# Patient Record
Sex: Male | Born: 1945
Health system: Southern US, Community
[De-identification: ages and names within clinical notes are randomized; demographics above are authoritative.]

## PROBLEM LIST (undated history)

## (undated) DIAGNOSIS — D649 Anemia, unspecified: Secondary | ICD-10-CM

## (undated) DIAGNOSIS — E781 Pure hyperglyceridemia: Secondary | ICD-10-CM

## (undated) DIAGNOSIS — Z860100 Personal history of colon polyps, unspecified: Secondary | ICD-10-CM

## (undated) DIAGNOSIS — M21372 Foot drop, left foot: Secondary | ICD-10-CM

## (undated) DIAGNOSIS — Z8719 Personal history of other diseases of the digestive system: Secondary | ICD-10-CM

## (undated) DIAGNOSIS — Z8489 Family history of other specified conditions: Secondary | ICD-10-CM

## (undated) DIAGNOSIS — K219 Gastro-esophageal reflux disease without esophagitis: Secondary | ICD-10-CM

## (undated) DIAGNOSIS — Z8673 Personal history of transient ischemic attack (TIA), and cerebral infarction without residual deficits: Secondary | ICD-10-CM

## (undated) DIAGNOSIS — I10 Essential (primary) hypertension: Secondary | ICD-10-CM

## (undated) DIAGNOSIS — I350 Nonrheumatic aortic (valve) stenosis: Secondary | ICD-10-CM

## (undated) DIAGNOSIS — Z8679 Personal history of other diseases of the circulatory system: Secondary | ICD-10-CM

## (undated) DIAGNOSIS — E1140 Type 2 diabetes mellitus with diabetic neuropathy, unspecified: Secondary | ICD-10-CM

## (undated) HISTORY — PX: SPINAL FUSION: SHX223

## (undated) HISTORY — DX: Foot drop, left foot: M21.372

## (undated) HISTORY — DX: Personal history of colon polyps, unspecified: Z86.0100

## (undated) HISTORY — PX: HAND SURGERY: SHX662

## (undated) HISTORY — DX: Anemia, unspecified: D64.9

## (undated) HISTORY — DX: Type 2 diabetes mellitus with diabetic neuropathy, unspecified: E11.40

## (undated) HISTORY — DX: Personal history of colonic polyps: Z86.010

## (undated) HISTORY — PX: COLONOSCOPY: SHX174

## (undated) HISTORY — PX: ESOPHAGOGASTRODUODENOSCOPY: SHX1529

## (undated) HISTORY — PX: TONSILLECTOMY: SUR1361

## (undated) HISTORY — DX: Personal history of transient ischemic attack (TIA), and cerebral infarction without residual deficits: Z86.73

## (undated) HISTORY — DX: Nonrheumatic aortic (valve) stenosis: I35.0

## (undated) HISTORY — DX: Personal history of other diseases of the circulatory system: Z86.79

## (undated) HISTORY — DX: Essential (primary) hypertension: I10

## (undated) HISTORY — DX: Pure hyperglyceridemia: E78.1

## (undated) SURGERY — CRANIOTOMY HEMATOMA EVACUATION SUBDURAL
Anesthesia: General | Laterality: Left

---

## 2001-10-07 ENCOUNTER — Encounter: Payer: Self-pay | Admitting: Emergency Medicine

## 2001-10-07 ENCOUNTER — Emergency Department (HOSPITAL_COMMUNITY): Admission: EM | Admit: 2001-10-07 | Discharge: 2001-10-07 | Payer: Self-pay | Admitting: Emergency Medicine

## 2002-09-28 ENCOUNTER — Encounter: Payer: Self-pay | Admitting: Orthopaedic Surgery

## 2002-10-03 ENCOUNTER — Encounter: Payer: Self-pay | Admitting: Orthopaedic Surgery

## 2002-10-03 ENCOUNTER — Observation Stay (HOSPITAL_COMMUNITY): Admission: RE | Admit: 2002-10-03 | Discharge: 2002-10-04 | Payer: Self-pay | Admitting: Orthopaedic Surgery

## 2004-03-31 ENCOUNTER — Ambulatory Visit (HOSPITAL_COMMUNITY): Admission: RE | Admit: 2004-03-31 | Discharge: 2004-03-31 | Payer: Self-pay | Admitting: Orthopedic Surgery

## 2004-03-31 ENCOUNTER — Ambulatory Visit (HOSPITAL_BASED_OUTPATIENT_CLINIC_OR_DEPARTMENT_OTHER): Admission: RE | Admit: 2004-03-31 | Discharge: 2004-03-31 | Payer: Self-pay | Admitting: Orthopedic Surgery

## 2004-12-21 IMAGING — CR DG CHEST 2V
2 series · 2 of 2 positions shown · non-contrast
Comparison: Report from prior study dated [DATE].

CLINICAL DATA: Lumbar spondylosis and spinal stenosis.  Preoperative respiratory exam. 
 TWO VIEW CHEST ? [DATE]:

[view not recorded (1 of 2)]
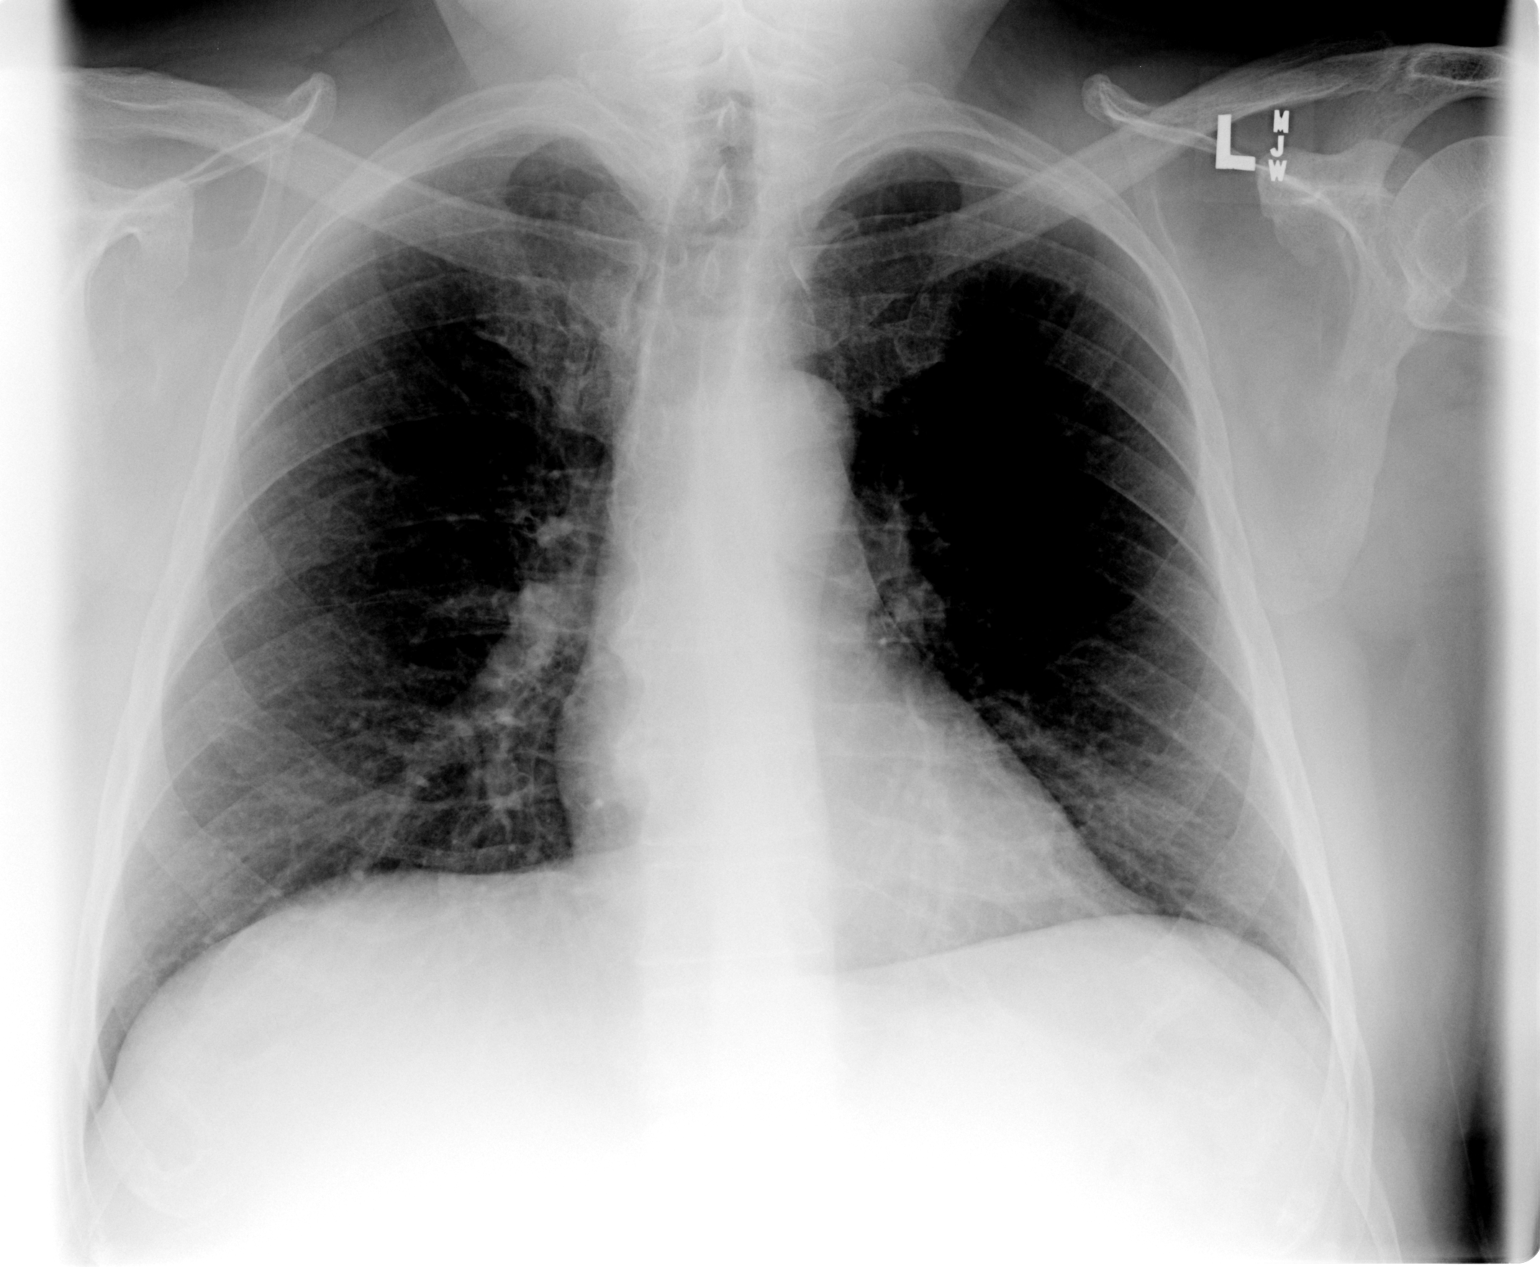

[view not recorded (2 of 2)]
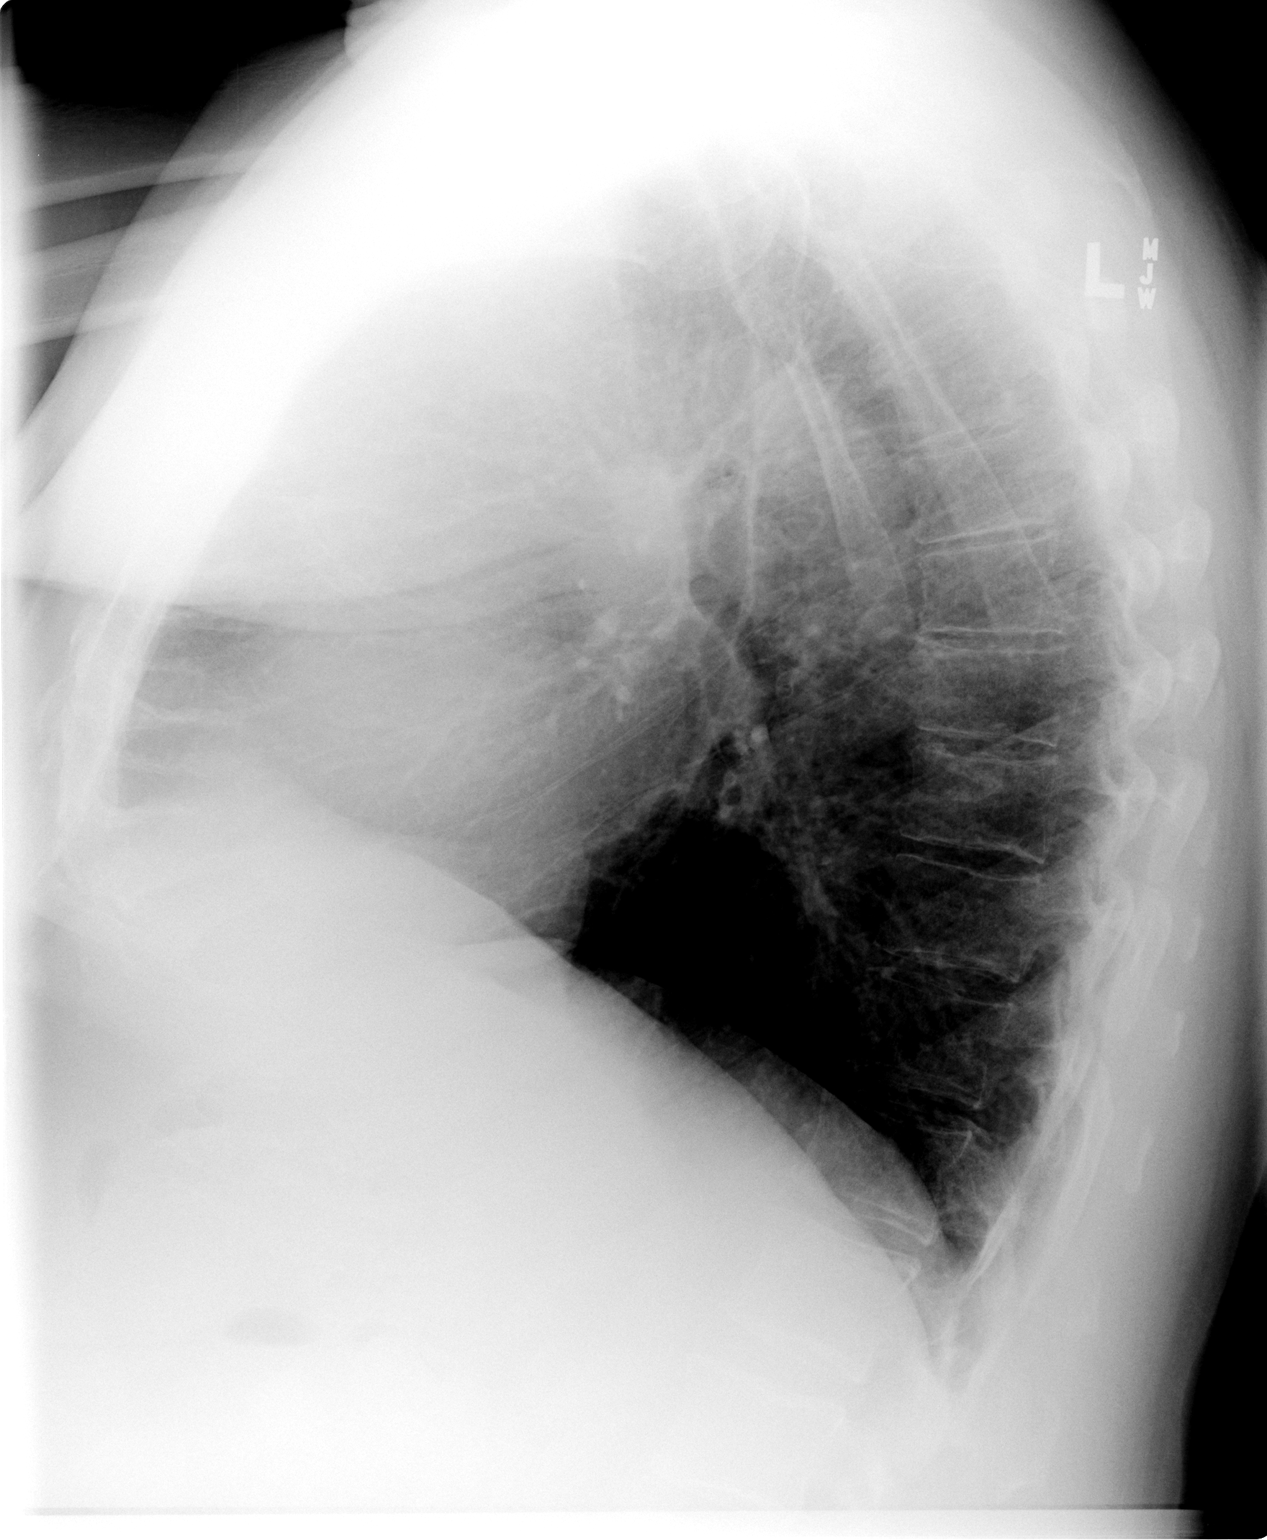

[2 of 2 positions shown; findings below may reference images not displayed]

The heart size and mediastinal contours are normal.  The lungs are clear.  The visualized skeleton is unremarkable.
IMPRESSION: No active disease.

## 2004-12-23 ENCOUNTER — Inpatient Hospital Stay (HOSPITAL_COMMUNITY): Admission: RE | Admit: 2004-12-23 | Discharge: 2004-12-26 | Payer: Self-pay | Admitting: Orthopaedic Surgery

## 2005-09-22 ENCOUNTER — Ambulatory Visit: Payer: Self-pay | Admitting: Gastroenterology

## 2005-10-04 ENCOUNTER — Ambulatory Visit: Payer: Self-pay | Admitting: Gastroenterology

## 2005-12-30 ENCOUNTER — Ambulatory Visit: Payer: Self-pay | Admitting: Oncology

## 2006-01-27 LAB — CHCC SMEAR

## 2006-01-27 LAB — CBC & DIFF AND RETIC
BASO%: 1.3 % (ref 0.0–2.0)
Basophils Absolute: 0.1 10*3/uL (ref 0.0–0.1)
EOS%: 3.4 % (ref 0.0–7.0)
HCT: 32.6 % — ABNORMAL LOW (ref 38.7–49.9)
HGB: 11.5 g/dL — ABNORMAL LOW (ref 13.0–17.1)
MCHC: 35.3 g/dL (ref 32.0–35.9)
MONO#: 0.5 10*3/uL (ref 0.1–0.9)
MONO%: 5.5 % (ref 0.0–13.0)
NEUT%: 67.6 % (ref 40.0–75.0)
RDW: 14.3 % (ref 11.2–14.6)
WBC: 9.4 10*3/uL (ref 4.0–10.0)

## 2006-01-27 LAB — ERYTHROCYTE SEDIMENTATION RATE: Sed Rate: 32 mm/hr — ABNORMAL HIGH (ref 0–20)

## 2006-01-28 LAB — FERRITIN: Ferritin: 277 ng/mL (ref 22–322)

## 2006-01-28 LAB — COMPREHENSIVE METABOLIC PANEL
AST: 19 U/L (ref 0–37)
Albumin: 5 g/dL (ref 3.5–5.2)
CO2: 27 mEq/L (ref 19–32)
Glucose, Bld: 86 mg/dL (ref 70–99)
Sodium: 139 mEq/L (ref 135–145)
Total Protein: 7.6 g/dL (ref 6.0–8.3)

## 2006-01-28 LAB — IRON AND TIBC
Iron: 89 ug/dL (ref 42–165)
TIBC: 426 ug/dL (ref 215–435)
UIBC: 337 ug/dL

## 2006-02-15 ENCOUNTER — Ambulatory Visit: Payer: Self-pay | Admitting: Oncology

## 2006-02-18 ENCOUNTER — Ambulatory Visit (HOSPITAL_COMMUNITY): Admission: RE | Admit: 2006-02-18 | Discharge: 2006-02-18 | Payer: Self-pay | Admitting: Oncology

## 2006-02-18 LAB — RETICULOCYTES
IRF: 0.44 — ABNORMAL HIGH (ref 0.070–0.380)
RETIC #: 91.3 10*3/uL (ref 31.8–103.9)

## 2006-02-18 LAB — CBC WITH DIFFERENTIAL/PLATELET
BASO%: 0.5 % (ref 0.0–2.0)
EOS%: 5.7 % (ref 0.0–7.0)
HGB: 11.8 g/dL — ABNORMAL LOW (ref 13.0–17.1)
MCV: 85.8 fL (ref 81.6–98.0)
MONO%: 5.6 % (ref 0.0–13.0)
NEUT#: 5.5 10*3/uL (ref 1.5–6.5)
RDW: 14.4 % (ref 11.2–14.6)
WBC: 9.5 10*3/uL (ref 4.0–10.0)

## 2006-02-18 LAB — CHCC SMEAR

## 2006-02-18 IMAGING — CR DG CHEST 2V
2 series · 2 of 2 positions shown · non-contrast
Comparison: [DATE].

CLINICAL DATA: Anemia and shortness of breath.
 CHEST - 2 VIEW ? [DATE]:

[view not recorded (1 of 2)]
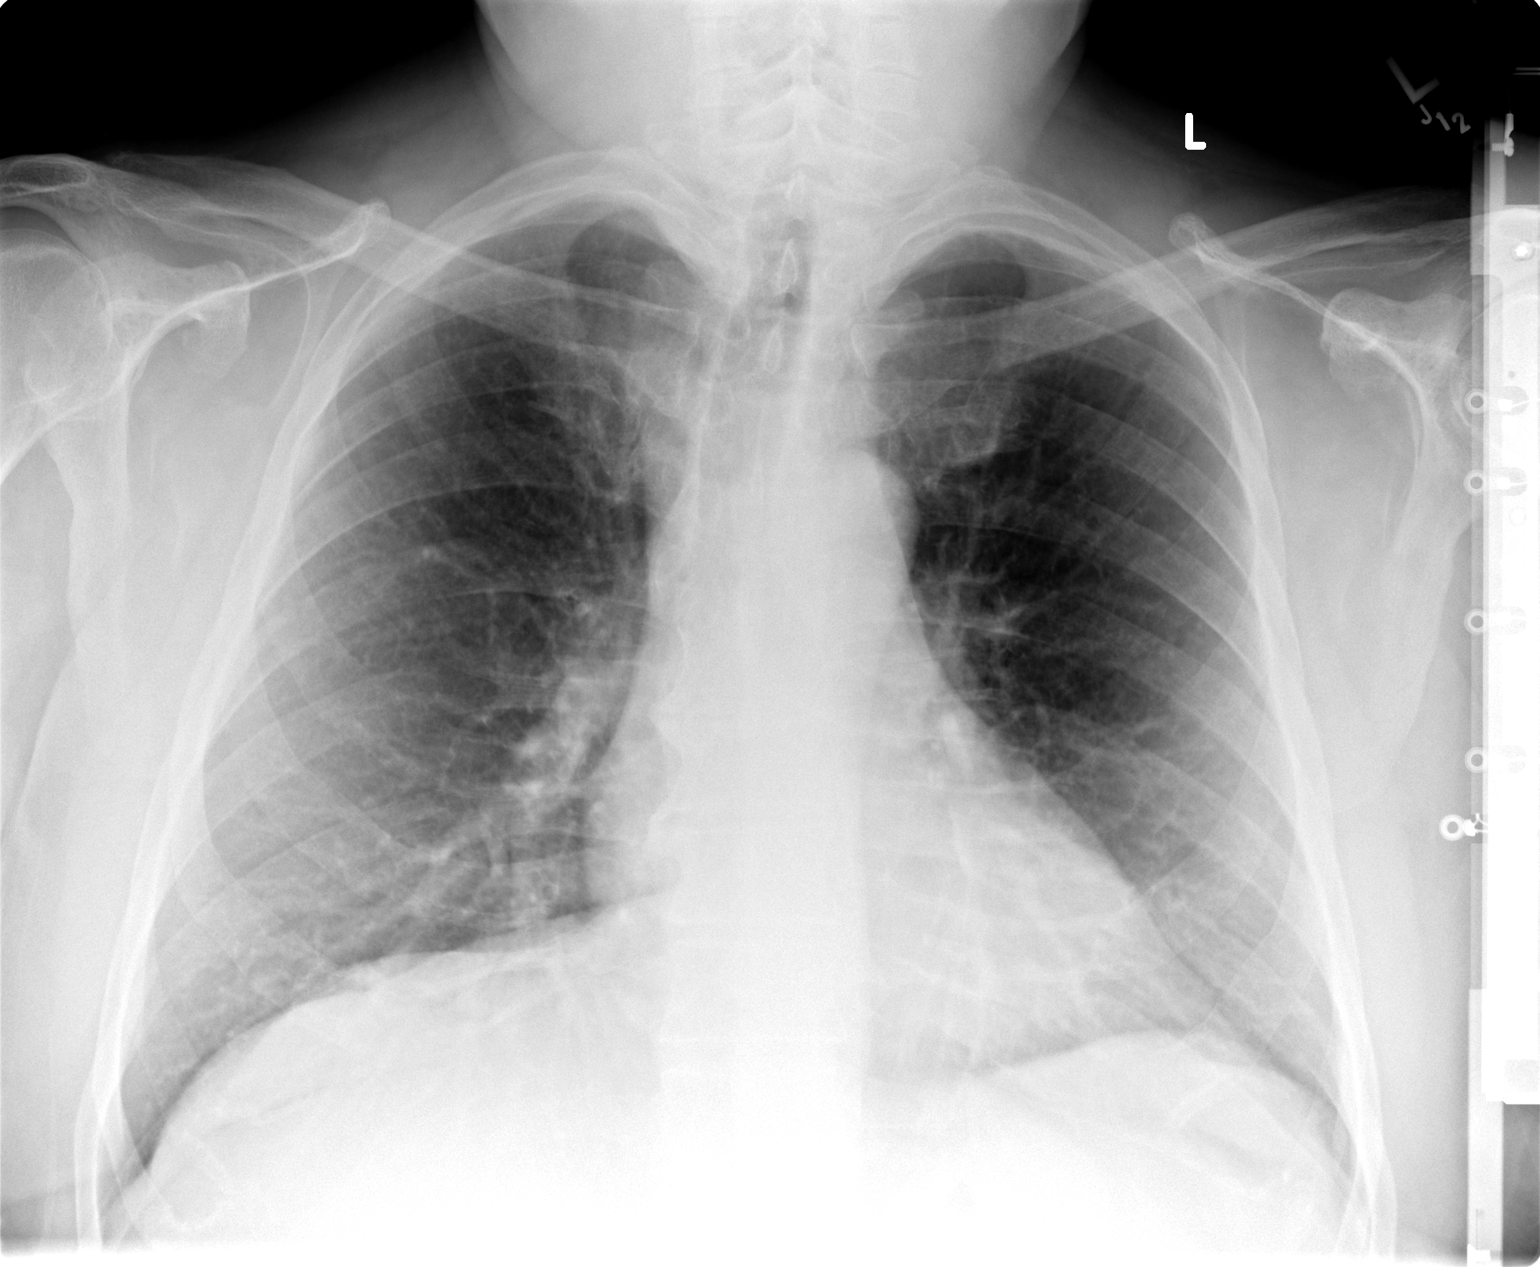

[view not recorded (2 of 2)]
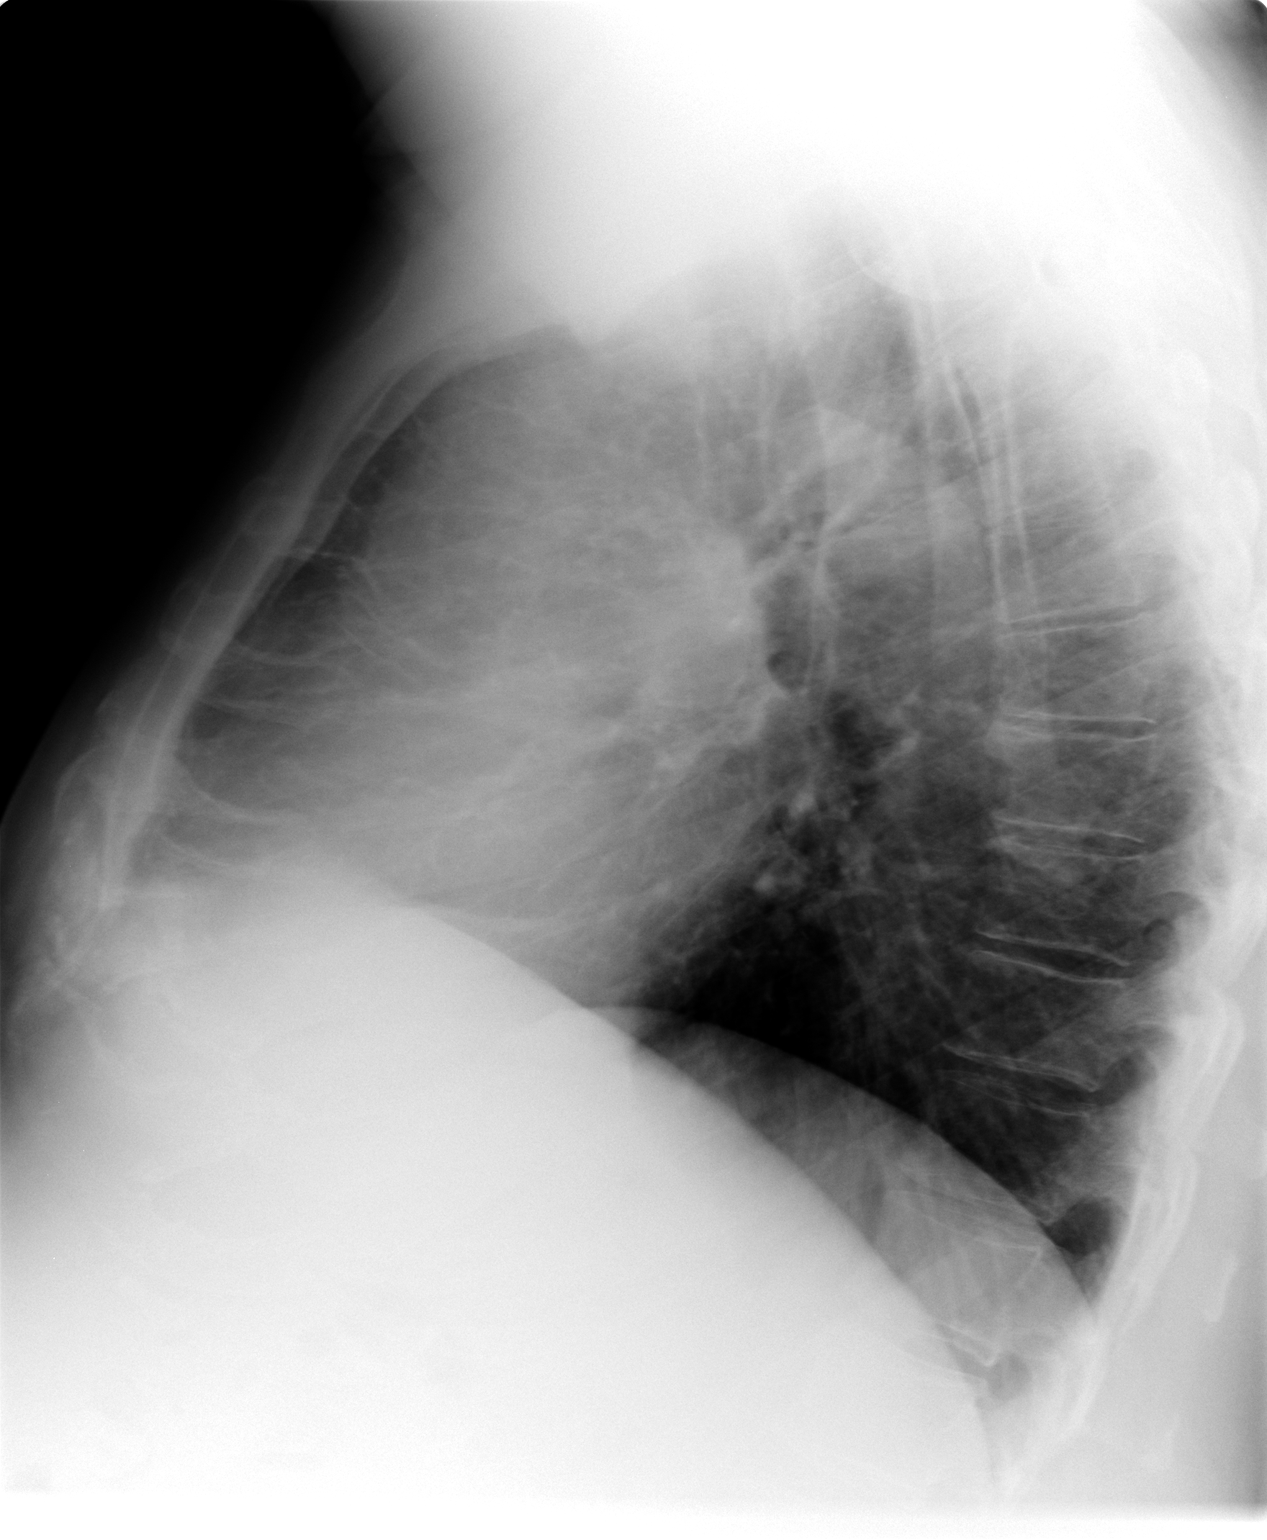

[2 of 2 positions shown; findings below may reference images not displayed]

FINDINGS: Mild bibasilar atelectasis is present.  No edema, infiltrate, nodule, or mass is detected.  The heart size and mediastinal contours are within normal limits.  The visualized bony thorax is unremarkable.
IMPRESSION: Bibasilar atelectasis.  No active disease.

## 2006-02-22 ENCOUNTER — Ambulatory Visit (HOSPITAL_COMMUNITY): Admission: RE | Admit: 2006-02-22 | Discharge: 2006-02-22 | Payer: Self-pay | Admitting: Oncology

## 2006-02-22 IMAGING — US US ABDOMEN COMPLETE
1 series · 13 of 25 positions shown · non-contrast
Comparison: none

CLINICAL DATA: Abdominal pain, anemia, iron deficiency.
 ABDOMEN ULTRASOUND COMPLETE ? [DATE]:
TECHNIQUE: Complete abdominal ultrasound examination was performed including evaluation of the liver, gallbladder, bile ducts, pancreas, kidneys, spleen, IVC, and abdominal aorta.

[Series 1: unknown · 0.43mm/px · 13 of 80 slices shown]
[im 1/80]
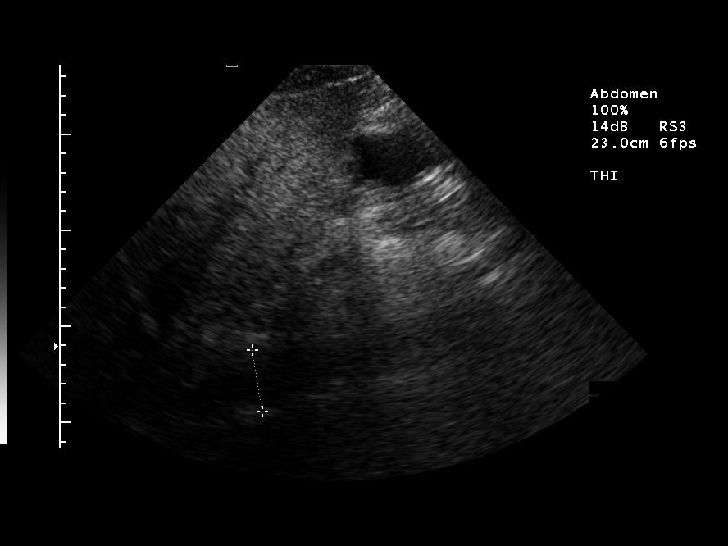
[im 7/80]
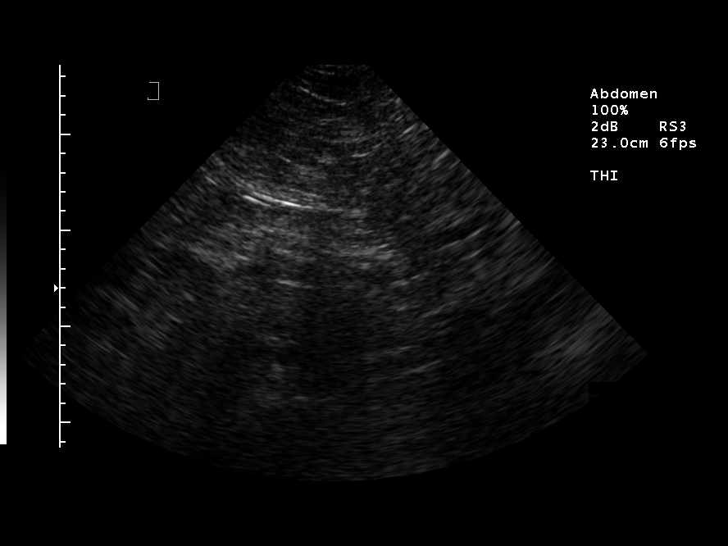
[im 14/80]
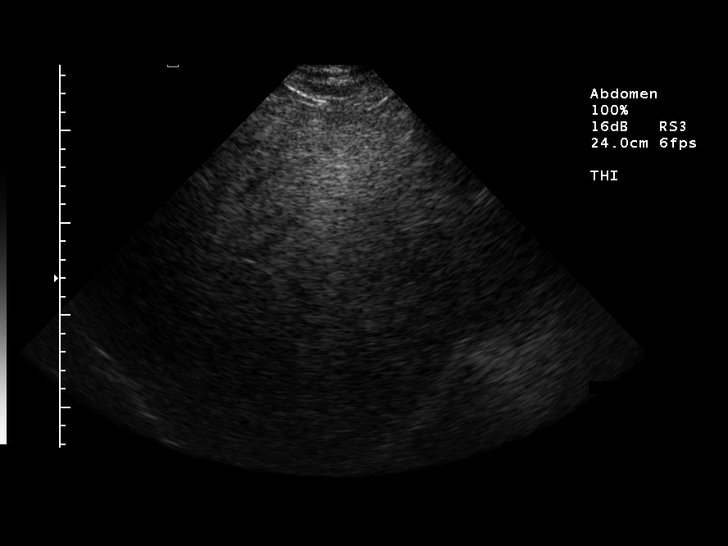
[im 20/80]
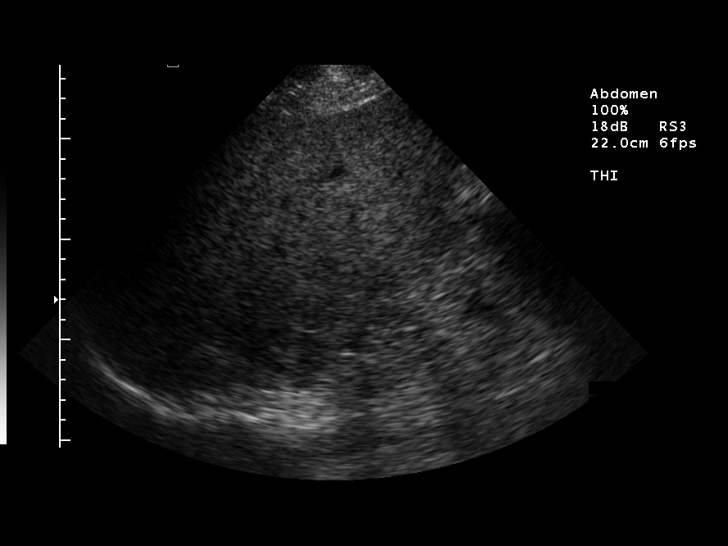
[im 27/80]
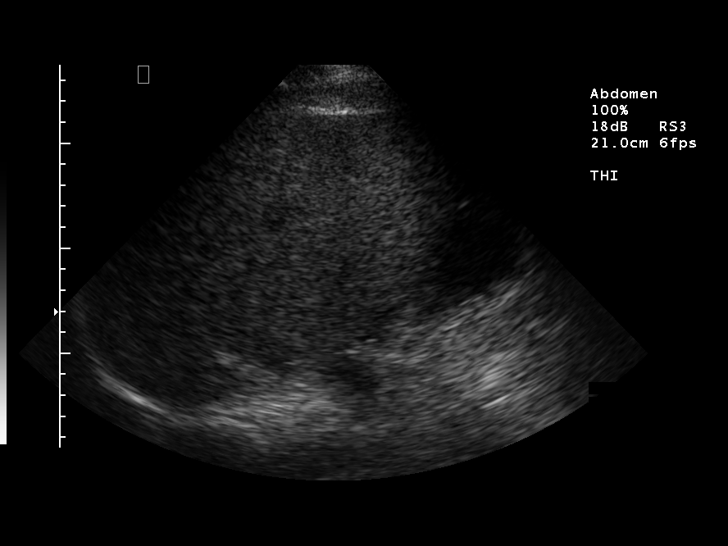
[im 33/80]
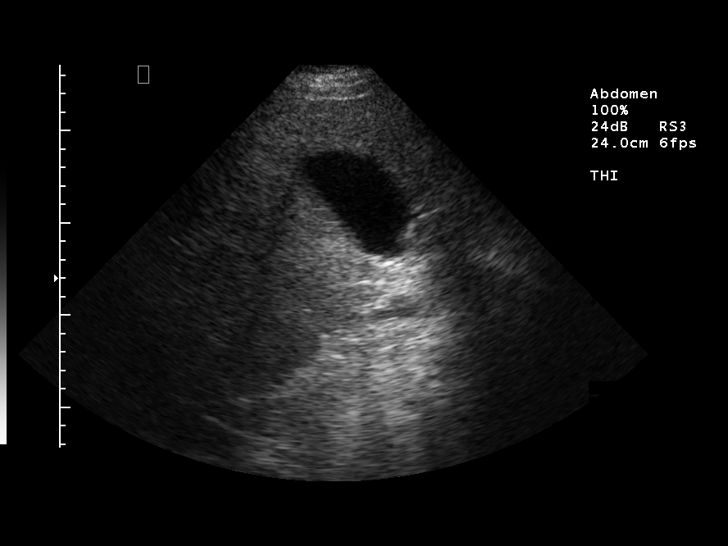
[im 40/80]
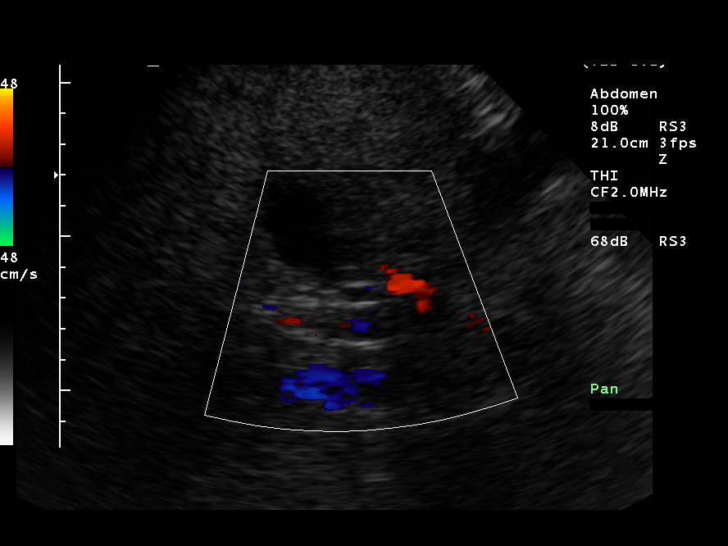
[im 47/80]
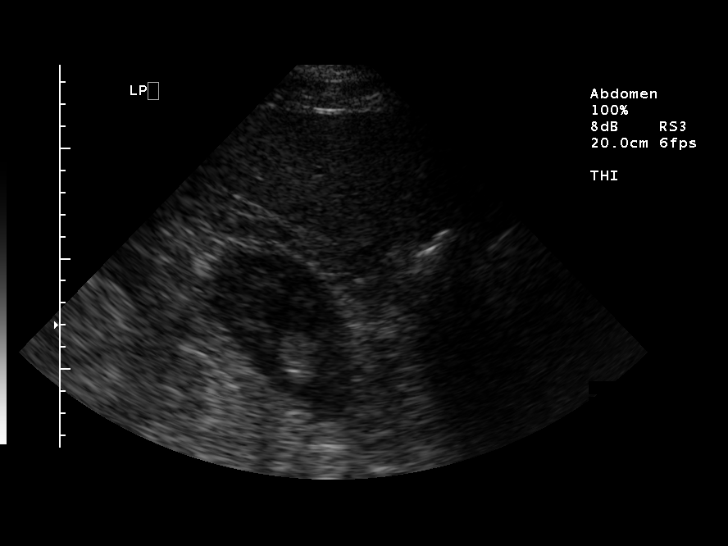
[im 53/80]
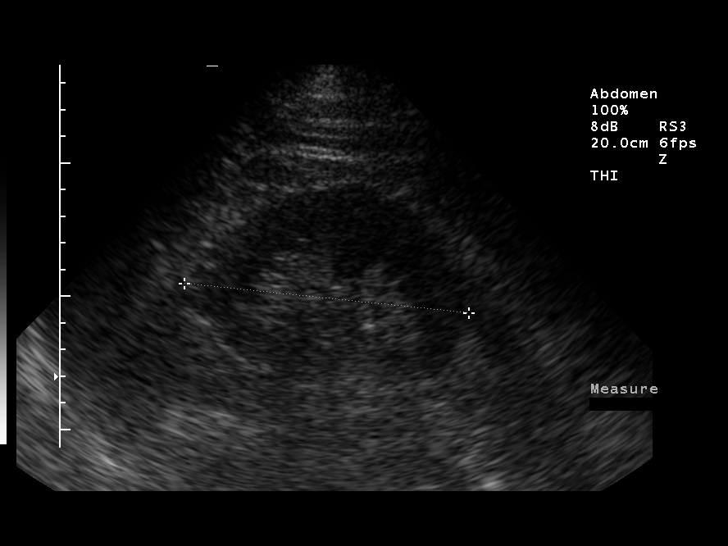
[im 60/80]
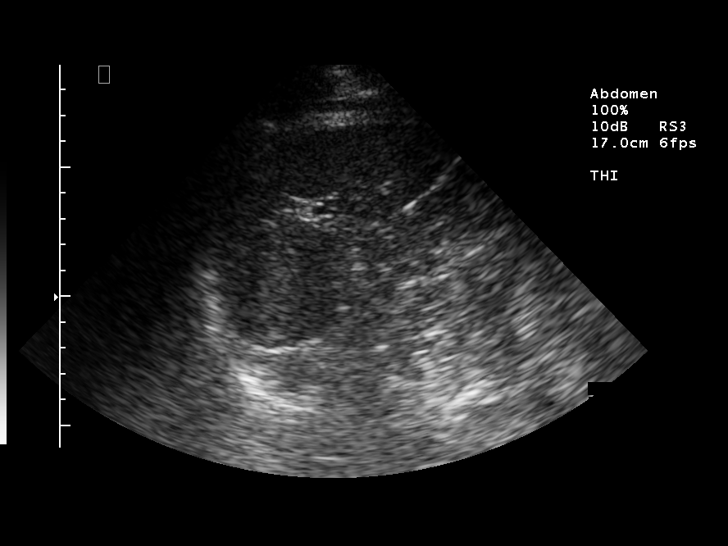
[im 66/80]
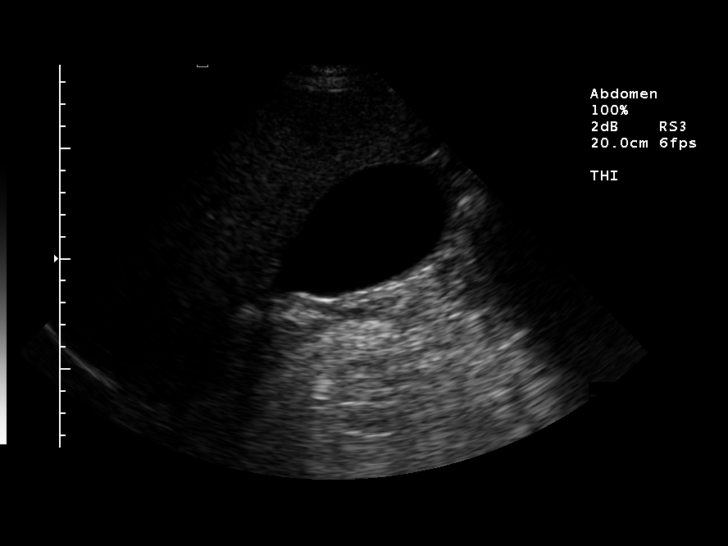
[im 73/80]
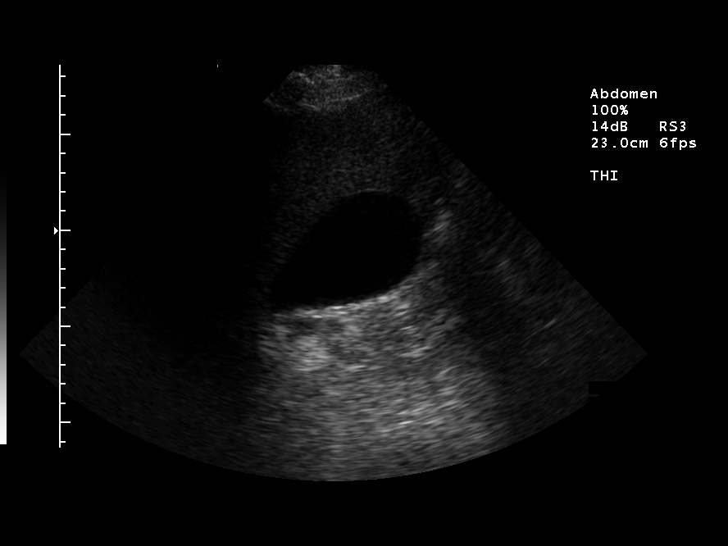
[im 80/80]
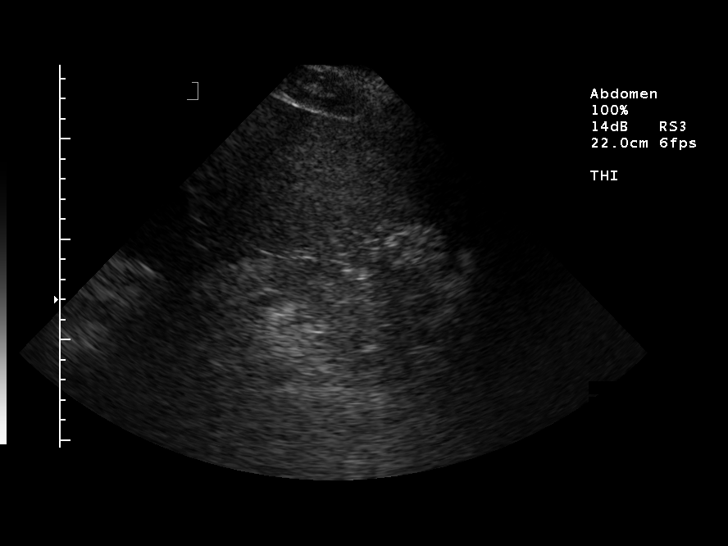

[13 of 25 positions shown; findings below may reference images not displayed]

FINDINGS: The gallbladder is adequately visualized and has a normal appearance.  The common bile duct is normal in caliber measuring 5 mm in diameter.  The liver is somewhat prominent in size and is somewhat echodense consistent with fatty change.  There are no focal hepatic abnormalities.  Portions of the dome of the right lobe of the liver cannot be visualized.  The inferior vena cava is patent.  The pancreas is not well visualized due to overlying bowel gas.  The spleen and kidneys have a normal appearance with the right kidney measuring 11.7 cm in length and the left kidney measuring 10.7 cm in length.  The abdominal aorta is poorly visualized within the mid and distal portions due to overlying bowel gas.
IMPRESSION: 1.  Normal appearing gallbladder and common bile duct.  
 2.  Incomplete visualization of the pancreas and mid and distal abdominal aorta due to overlying bowel gas.

## 2006-04-12 ENCOUNTER — Ambulatory Visit: Payer: Self-pay | Admitting: Oncology

## 2006-04-15 LAB — CBC WITH DIFFERENTIAL/PLATELET
LYMPH%: 26.7 % (ref 14.0–48.0)
MCH: 29.7 pg (ref 28.0–33.4)
MCV: 86.9 fL (ref 81.6–98.0)
MONO#: 0.5 10*3/uL (ref 0.1–0.9)
MONO%: 7 % (ref 0.0–13.0)
NEUT#: 4.8 10*3/uL (ref 1.5–6.5)
Platelets: 422 10*3/uL — ABNORMAL HIGH (ref 145–400)
RBC: 3.91 10*6/uL — ABNORMAL LOW (ref 4.20–5.71)
RDW: 14.6 % (ref 11.2–14.6)

## 2006-04-15 LAB — CHCC SMEAR

## 2006-04-15 LAB — LACTATE DEHYDROGENASE: LDH: 106 U/L (ref 94–250)

## 2006-04-15 LAB — COMPREHENSIVE METABOLIC PANEL
ALT: 14 U/L (ref 0–40)
Chloride: 103 mEq/L (ref 96–112)
Glucose, Bld: 161 mg/dL — ABNORMAL HIGH (ref 70–99)
Sodium: 140 mEq/L (ref 135–145)
Total Bilirubin: 0.3 mg/dL (ref 0.3–1.2)

## 2006-04-15 LAB — ERYTHROCYTE SEDIMENTATION RATE: Sed Rate: 23 mm/hr — ABNORMAL HIGH (ref 0–20)

## 2008-07-25 HISTORY — PX: OTHER SURGICAL HISTORY: SHX169

## 2008-07-25 HISTORY — PX: TARSAL TUNNEL RELEASE: SUR1099

## 2008-07-25 HISTORY — PX: FOOT CAPSULOTOMY: SUR191

## 2012-11-16 DIAGNOSIS — E1165 Type 2 diabetes mellitus with hyperglycemia: Secondary | ICD-10-CM | POA: Insufficient documentation

## 2013-02-06 ENCOUNTER — Encounter: Payer: Self-pay | Admitting: Podiatry

## 2013-02-06 ENCOUNTER — Ambulatory Visit (INDEPENDENT_AMBULATORY_CARE_PROVIDER_SITE_OTHER): Payer: BLUE CROSS/BLUE SHIELD | Admitting: Podiatry

## 2013-02-06 VITALS — Ht 71.0 in | Wt 278.0 lb

## 2013-02-06 DIAGNOSIS — S6982XA Other specified injuries of left wrist, hand and finger(s), initial encounter: Secondary | ICD-10-CM

## 2013-02-06 DIAGNOSIS — M25579 Pain in unspecified ankle and joints of unspecified foot: Secondary | ICD-10-CM | POA: Insufficient documentation

## 2013-02-06 DIAGNOSIS — E1149 Type 2 diabetes mellitus with other diabetic neurological complication: Secondary | ICD-10-CM

## 2013-02-06 DIAGNOSIS — M25572 Pain in left ankle and joints of left foot: Secondary | ICD-10-CM

## 2013-02-06 DIAGNOSIS — S6980XA Other specified injuries of unspecified wrist, hand and finger(s), initial encounter: Secondary | ICD-10-CM

## 2013-02-06 DIAGNOSIS — S6990XA Unspecified injury of unspecified wrist, hand and finger(s), initial encounter: Secondary | ICD-10-CM

## 2013-02-06 NOTE — Progress Notes (Signed)
Subjective:  Type II IDDM presents complaining of damaged nail 2nd left without history of injury.  He noted the toe was bleeding since 4/20 (Sunday), and now the whole nail came off and sore .  He came in to have it checked out.  Blood sugar is 122, A1C was 7.9 when checked last 2 month ago.  Podiatric History:  Tommy Green was seen at this office in 2009 for Tarsal tunnel release and hammer toe problem 2nd left. Tarsal tunnel release and Hammer toe surgery was done on the left foot. Stated that Tarsal tunnel release helped him for about a year and the problem came back. The 2nd toe left is straight and is in extended position at MPJ level.   Objective:  DP and PT palpable left. Broken off nail at distal 1/4, with clean nail bed. S/P Tarsal tunnel release in Nov 2009,   And Hammer toe surgery 2nd left.   Assessment:  Clean nail injury with exposed nail bed at distal 1/4, 2nd digit left.  Plan:  Keep it clean Betadine and covered with band aid till tenderness subside.

## 2013-07-31 DIAGNOSIS — E1169 Type 2 diabetes mellitus with other specified complication: Secondary | ICD-10-CM | POA: Insufficient documentation

## 2013-11-02 DIAGNOSIS — I00 Rheumatic fever without heart involvement: Secondary | ICD-10-CM | POA: Insufficient documentation

## 2013-11-02 DIAGNOSIS — K219 Gastro-esophageal reflux disease without esophagitis: Secondary | ICD-10-CM | POA: Insufficient documentation

## 2014-01-31 DIAGNOSIS — E786 Lipoprotein deficiency: Secondary | ICD-10-CM | POA: Insufficient documentation

## 2014-02-18 ENCOUNTER — Telehealth: Payer: Self-pay | Admitting: *Deleted

## 2014-02-18 NOTE — Telephone Encounter (Signed)
A user error has taken place.

## 2014-08-19 ENCOUNTER — Telehealth: Payer: Self-pay | Admitting: Gastroenterology

## 2014-08-20 NOTE — Telephone Encounter (Signed)
Recall is for 09/2015.  He is notified.  He is advised that he will get a letter next year

## 2015-01-13 DIAGNOSIS — J320 Chronic maxillary sinusitis: Secondary | ICD-10-CM | POA: Insufficient documentation

## 2015-01-27 DIAGNOSIS — H6993 Unspecified Eustachian tube disorder, bilateral: Secondary | ICD-10-CM | POA: Insufficient documentation

## 2015-05-15 DIAGNOSIS — Z87891 Personal history of nicotine dependence: Secondary | ICD-10-CM | POA: Insufficient documentation

## 2015-09-25 DIAGNOSIS — L02416 Cutaneous abscess of left lower limb: Secondary | ICD-10-CM | POA: Insufficient documentation

## 2015-10-10 ENCOUNTER — Encounter: Payer: Self-pay | Admitting: Gastroenterology

## 2015-11-04 DIAGNOSIS — M24152 Other articular cartilage disorders, left hip: Secondary | ICD-10-CM | POA: Insufficient documentation

## 2015-12-10 DIAGNOSIS — J349 Unspecified disorder of nose and nasal sinuses: Secondary | ICD-10-CM | POA: Insufficient documentation

## 2015-12-15 DIAGNOSIS — R0981 Nasal congestion: Secondary | ICD-10-CM | POA: Insufficient documentation

## 2015-12-20 DIAGNOSIS — J343 Hypertrophy of nasal turbinates: Secondary | ICD-10-CM | POA: Insufficient documentation

## 2016-03-19 DIAGNOSIS — E1129 Type 2 diabetes mellitus with other diabetic kidney complication: Secondary | ICD-10-CM | POA: Insufficient documentation

## 2016-06-19 DIAGNOSIS — D649 Anemia, unspecified: Secondary | ICD-10-CM | POA: Insufficient documentation

## 2016-06-30 DIAGNOSIS — Z1159 Encounter for screening for other viral diseases: Secondary | ICD-10-CM | POA: Insufficient documentation

## 2016-10-29 DIAGNOSIS — I10 Essential (primary) hypertension: Secondary | ICD-10-CM | POA: Diagnosis not present

## 2016-10-29 DIAGNOSIS — R809 Proteinuria, unspecified: Secondary | ICD-10-CM | POA: Diagnosis not present

## 2016-10-29 DIAGNOSIS — E1129 Type 2 diabetes mellitus with other diabetic kidney complication: Secondary | ICD-10-CM | POA: Diagnosis not present

## 2016-10-29 DIAGNOSIS — E1142 Type 2 diabetes mellitus with diabetic polyneuropathy: Secondary | ICD-10-CM | POA: Diagnosis not present

## 2016-10-29 DIAGNOSIS — E1165 Type 2 diabetes mellitus with hyperglycemia: Secondary | ICD-10-CM | POA: Diagnosis not present

## 2016-10-29 DIAGNOSIS — E781 Pure hyperglyceridemia: Secondary | ICD-10-CM | POA: Diagnosis not present

## 2016-11-15 DIAGNOSIS — R69 Illness, unspecified: Secondary | ICD-10-CM | POA: Diagnosis not present

## 2016-11-24 DIAGNOSIS — Z0100 Encounter for examination of eyes and vision without abnormal findings: Secondary | ICD-10-CM | POA: Diagnosis not present

## 2016-11-25 DIAGNOSIS — E669 Obesity, unspecified: Secondary | ICD-10-CM | POA: Diagnosis not present

## 2016-11-25 DIAGNOSIS — M130 Polyarthritis, unspecified: Secondary | ICD-10-CM | POA: Diagnosis not present

## 2016-11-25 DIAGNOSIS — R42 Dizziness and giddiness: Secondary | ICD-10-CM | POA: Diagnosis not present

## 2016-11-25 DIAGNOSIS — R000 Tachycardia, unspecified: Secondary | ICD-10-CM | POA: Diagnosis not present

## 2016-11-25 DIAGNOSIS — E1142 Type 2 diabetes mellitus with diabetic polyneuropathy: Secondary | ICD-10-CM | POA: Diagnosis not present

## 2016-11-25 DIAGNOSIS — R011 Cardiac murmur, unspecified: Secondary | ICD-10-CM | POA: Diagnosis not present

## 2016-11-25 DIAGNOSIS — Z6837 Body mass index (BMI) 37.0-37.9, adult: Secondary | ICD-10-CM | POA: Diagnosis not present

## 2016-11-25 DIAGNOSIS — E785 Hyperlipidemia, unspecified: Secondary | ICD-10-CM | POA: Diagnosis not present

## 2016-11-25 DIAGNOSIS — Z87891 Personal history of nicotine dependence: Secondary | ICD-10-CM | POA: Diagnosis not present

## 2016-11-25 DIAGNOSIS — Z0000 Encounter for general adult medical examination without abnormal findings: Secondary | ICD-10-CM | POA: Diagnosis not present

## 2016-11-25 DIAGNOSIS — I10 Essential (primary) hypertension: Secondary | ICD-10-CM | POA: Diagnosis not present

## 2016-12-07 DIAGNOSIS — D649 Anemia, unspecified: Secondary | ICD-10-CM | POA: Diagnosis not present

## 2016-12-07 DIAGNOSIS — H6121 Impacted cerumen, right ear: Secondary | ICD-10-CM | POA: Diagnosis not present

## 2016-12-07 DIAGNOSIS — E1142 Type 2 diabetes mellitus with diabetic polyneuropathy: Secondary | ICD-10-CM | POA: Diagnosis not present

## 2016-12-07 DIAGNOSIS — I10 Essential (primary) hypertension: Secondary | ICD-10-CM | POA: Diagnosis not present

## 2016-12-07 DIAGNOSIS — G629 Polyneuropathy, unspecified: Secondary | ICD-10-CM | POA: Diagnosis not present

## 2017-01-04 DIAGNOSIS — R69 Illness, unspecified: Secondary | ICD-10-CM | POA: Diagnosis not present

## 2017-01-11 DIAGNOSIS — D485 Neoplasm of uncertain behavior of skin: Secondary | ICD-10-CM | POA: Diagnosis not present

## 2017-01-11 DIAGNOSIS — D044 Carcinoma in situ of skin of scalp and neck: Secondary | ICD-10-CM | POA: Diagnosis not present

## 2017-01-11 DIAGNOSIS — L821 Other seborrheic keratosis: Secondary | ICD-10-CM | POA: Diagnosis not present

## 2017-01-11 DIAGNOSIS — L820 Inflamed seborrheic keratosis: Secondary | ICD-10-CM | POA: Diagnosis not present

## 2017-01-11 DIAGNOSIS — L570 Actinic keratosis: Secondary | ICD-10-CM | POA: Diagnosis not present

## 2017-02-10 DIAGNOSIS — C44329 Squamous cell carcinoma of skin of other parts of face: Secondary | ICD-10-CM | POA: Diagnosis not present

## 2017-02-10 DIAGNOSIS — C4442 Squamous cell carcinoma of skin of scalp and neck: Secondary | ICD-10-CM | POA: Diagnosis not present

## 2017-02-23 DIAGNOSIS — E559 Vitamin D deficiency, unspecified: Secondary | ICD-10-CM | POA: Diagnosis not present

## 2017-02-23 DIAGNOSIS — E1165 Type 2 diabetes mellitus with hyperglycemia: Secondary | ICD-10-CM | POA: Diagnosis not present

## 2017-02-23 DIAGNOSIS — E781 Pure hyperglyceridemia: Secondary | ICD-10-CM | POA: Diagnosis not present

## 2017-02-25 DIAGNOSIS — G4733 Obstructive sleep apnea (adult) (pediatric): Secondary | ICD-10-CM | POA: Diagnosis not present

## 2017-02-25 DIAGNOSIS — E1129 Type 2 diabetes mellitus with other diabetic kidney complication: Secondary | ICD-10-CM | POA: Diagnosis not present

## 2017-02-25 DIAGNOSIS — E785 Hyperlipidemia, unspecified: Secondary | ICD-10-CM | POA: Diagnosis not present

## 2017-02-25 DIAGNOSIS — E1169 Type 2 diabetes mellitus with other specified complication: Secondary | ICD-10-CM | POA: Diagnosis not present

## 2017-02-25 DIAGNOSIS — E1142 Type 2 diabetes mellitus with diabetic polyneuropathy: Secondary | ICD-10-CM | POA: Diagnosis not present

## 2017-02-25 DIAGNOSIS — E119 Type 2 diabetes mellitus without complications: Secondary | ICD-10-CM | POA: Diagnosis not present

## 2017-02-25 DIAGNOSIS — R809 Proteinuria, unspecified: Secondary | ICD-10-CM | POA: Diagnosis not present

## 2017-02-25 DIAGNOSIS — I10 Essential (primary) hypertension: Secondary | ICD-10-CM | POA: Diagnosis not present

## 2017-03-01 DIAGNOSIS — R69 Illness, unspecified: Secondary | ICD-10-CM | POA: Diagnosis not present

## 2017-03-02 DIAGNOSIS — R69 Illness, unspecified: Secondary | ICD-10-CM | POA: Diagnosis not present

## 2017-03-24 DIAGNOSIS — M109 Gout, unspecified: Secondary | ICD-10-CM | POA: Diagnosis not present

## 2017-04-25 DIAGNOSIS — R69 Illness, unspecified: Secondary | ICD-10-CM | POA: Diagnosis not present

## 2017-05-02 DIAGNOSIS — R69 Illness, unspecified: Secondary | ICD-10-CM | POA: Diagnosis not present

## 2017-05-13 DIAGNOSIS — Z08 Encounter for follow-up examination after completed treatment for malignant neoplasm: Secondary | ICD-10-CM | POA: Diagnosis not present

## 2017-05-13 DIAGNOSIS — L570 Actinic keratosis: Secondary | ICD-10-CM | POA: Diagnosis not present

## 2017-05-13 DIAGNOSIS — L089 Local infection of the skin and subcutaneous tissue, unspecified: Secondary | ICD-10-CM | POA: Diagnosis not present

## 2017-05-13 DIAGNOSIS — Z85828 Personal history of other malignant neoplasm of skin: Secondary | ICD-10-CM | POA: Diagnosis not present

## 2017-06-03 DIAGNOSIS — E781 Pure hyperglyceridemia: Secondary | ICD-10-CM | POA: Diagnosis not present

## 2017-06-03 DIAGNOSIS — L089 Local infection of the skin and subcutaneous tissue, unspecified: Secondary | ICD-10-CM | POA: Diagnosis not present

## 2017-06-03 DIAGNOSIS — G629 Polyneuropathy, unspecified: Secondary | ICD-10-CM | POA: Diagnosis not present

## 2017-06-03 DIAGNOSIS — K219 Gastro-esophageal reflux disease without esophagitis: Secondary | ICD-10-CM | POA: Diagnosis not present

## 2017-06-03 DIAGNOSIS — D649 Anemia, unspecified: Secondary | ICD-10-CM | POA: Diagnosis not present

## 2017-06-03 DIAGNOSIS — I10 Essential (primary) hypertension: Secondary | ICD-10-CM | POA: Diagnosis not present

## 2017-06-03 DIAGNOSIS — E1142 Type 2 diabetes mellitus with diabetic polyneuropathy: Secondary | ICD-10-CM | POA: Diagnosis not present

## 2017-06-03 DIAGNOSIS — L309 Dermatitis, unspecified: Secondary | ICD-10-CM | POA: Diagnosis not present

## 2017-06-03 DIAGNOSIS — M1612 Unilateral primary osteoarthritis, left hip: Secondary | ICD-10-CM | POA: Diagnosis not present

## 2017-06-10 DIAGNOSIS — L03116 Cellulitis of left lower limb: Secondary | ICD-10-CM | POA: Diagnosis not present

## 2017-06-10 DIAGNOSIS — L02416 Cutaneous abscess of left lower limb: Secondary | ICD-10-CM | POA: Diagnosis not present

## 2017-06-15 DIAGNOSIS — M5136 Other intervertebral disc degeneration, lumbar region: Secondary | ICD-10-CM | POA: Insufficient documentation

## 2017-06-15 DIAGNOSIS — D509 Iron deficiency anemia, unspecified: Secondary | ICD-10-CM | POA: Insufficient documentation

## 2017-06-15 DIAGNOSIS — I639 Cerebral infarction, unspecified: Secondary | ICD-10-CM | POA: Insufficient documentation

## 2017-06-28 DIAGNOSIS — R809 Proteinuria, unspecified: Secondary | ICD-10-CM | POA: Diagnosis not present

## 2017-06-28 DIAGNOSIS — E1142 Type 2 diabetes mellitus with diabetic polyneuropathy: Secondary | ICD-10-CM | POA: Diagnosis not present

## 2017-06-28 DIAGNOSIS — D649 Anemia, unspecified: Secondary | ICD-10-CM | POA: Diagnosis not present

## 2017-06-28 DIAGNOSIS — E1129 Type 2 diabetes mellitus with other diabetic kidney complication: Secondary | ICD-10-CM | POA: Diagnosis not present

## 2017-06-28 DIAGNOSIS — I10 Essential (primary) hypertension: Secondary | ICD-10-CM | POA: Diagnosis not present

## 2017-06-28 DIAGNOSIS — Z6835 Body mass index (BMI) 35.0-35.9, adult: Secondary | ICD-10-CM | POA: Diagnosis not present

## 2017-06-29 DIAGNOSIS — R69 Illness, unspecified: Secondary | ICD-10-CM | POA: Diagnosis not present

## 2017-07-15 ENCOUNTER — Telehealth: Payer: Self-pay

## 2017-07-15 NOTE — Telephone Encounter (Signed)
Pre visit call attempted. Left message for return call.

## 2017-07-18 ENCOUNTER — Encounter: Payer: Self-pay | Admitting: Family Medicine

## 2017-07-18 ENCOUNTER — Ambulatory Visit (INDEPENDENT_AMBULATORY_CARE_PROVIDER_SITE_OTHER): Payer: Medicare HMO | Admitting: Family Medicine

## 2017-07-18 DIAGNOSIS — I10 Essential (primary) hypertension: Secondary | ICD-10-CM

## 2017-07-18 DIAGNOSIS — Z794 Long term (current) use of insulin: Secondary | ICD-10-CM | POA: Diagnosis not present

## 2017-07-18 DIAGNOSIS — R011 Cardiac murmur, unspecified: Secondary | ICD-10-CM | POA: Insufficient documentation

## 2017-07-18 DIAGNOSIS — E785 Hyperlipidemia, unspecified: Secondary | ICD-10-CM

## 2017-07-18 DIAGNOSIS — E781 Pure hyperglyceridemia: Secondary | ICD-10-CM | POA: Insufficient documentation

## 2017-07-18 DIAGNOSIS — E1140 Type 2 diabetes mellitus with diabetic neuropathy, unspecified: Secondary | ICD-10-CM | POA: Insufficient documentation

## 2017-07-18 DIAGNOSIS — Z8673 Personal history of transient ischemic attack (TIA), and cerebral infarction without residual deficits: Secondary | ICD-10-CM

## 2017-07-18 DIAGNOSIS — E1169 Type 2 diabetes mellitus with other specified complication: Secondary | ICD-10-CM | POA: Insufficient documentation

## 2017-07-18 HISTORY — DX: Type 2 diabetes mellitus with diabetic neuropathy, unspecified: E11.40

## 2017-07-18 HISTORY — DX: Personal history of transient ischemic attack (TIA), and cerebral infarction without residual deficits: Z86.73

## 2017-07-18 HISTORY — DX: Essential (primary) hypertension: I10

## 2017-07-18 HISTORY — DX: Pure hyperglyceridemia: E78.1

## 2017-07-18 NOTE — Patient Instructions (Addendum)
Check BP at home, wait 1 min and check again. Write down readings. Bring your blood pressure monitor to your next appointment. Check 2-3 times per week until I see you next.  Keep up the great work with your weight loss.   Call around 1 week before your run out of your prescriptions.  Let us know if you need anything.

## 2017-07-18 NOTE — Progress Notes (Signed)
Pre visit review using our clinic review tool, if applicable. No additional management support is needed unless otherwise documented below in the visit note. 

## 2017-07-18 NOTE — Progress Notes (Signed)
Chief Complaint  Patient presents with  . Establish Care       New Patient Visit SUBJECTIVE: HPI: Tommy Green is an 71 y.o.male who is being seen for establishing care.  The patient was previously seen at another office where physician got fired.  Has hx of DM II w neuropathy that is managed by endocrinology.  He also has hx of HTN.  He does monitor home blood pressures. Blood pressures ranging on average from 160-180's/60-70's. He is compliant with medications. Atenolol 50 mg daily, HCTZ 25 mg daily, lisinopril 40 mg daily.  Patient has these side effects of medication: none He is adhering to a healthy diet overall. Has lost 20-30 lbs and improved his diabetic control as well.  Exercise: physically active at home; no scheduled exercise  Has tried 3-4 statins and did not tolerate well.  Does not do well with flu shot.  Has hx of hypertriglyceridemia on Niacin and Lofibra.  Sleep study neg in   No Known Allergies  Past Medical History:  Diagnosis Date  . Essential hypertension 07/18/2017  . History of rheumatic fever   . History of stroke 07/18/2017  . Hypertriglyceridemia 07/18/2017  . Type 2 diabetes mellitus with diabetic neuropathy, unspecified ([place]) 07/18/2017   Past Surgical History:  Procedure Laterality Date  . FOOT CAPSULOTOMY Left 07/25/2008   Mid Foot #2 MPJ  . Hammertoe Repair Left 07/25/2008   #2 toe  . SPINAL FUSION    . TARSAL TUNNEL RELEASE Left 07/25/2008   Social History   Social History  . Marital status: Married    Spouse name: N/A  . Number of children: N/A  . Years of education: N/A   Occupational History  . Not on file.   Social History Main Topics  . Smoking status: Former Smoker    Types: Cigarettes  . Smokeless tobacco: Never Used     Comment: Quit in 1973  . Alcohol use No  . Drug use: No  . Sexual activity: No   Family History  Problem Relation Age of Onset  . Hyperlipidemia Mother   . Hyperlipidemia Father   . Cancer Neg  Hx      Current Outpatient Prescriptions:  .  aspirin 81 MG tablet, Take 81 mg by mouth daily., Disp: , Rfl:  .  atenolol (TENORMIN) 50 MG tablet, Take 50 mg by mouth daily., Disp: , Rfl:  .  fenofibrate micronized (LOFIBRA) 200 MG capsule, Take 200 mg by mouth daily before breakfast., Disp: , Rfl:  .  gabapentin (NEURONTIN) 600 MG tablet, Take 600 mg by mouth 3 (three) times daily., Disp: , Rfl:  .  hydrochlorothiazide (HYDRODIURIL) 25 MG tablet, Take 25 mg by mouth daily., Disp: , Rfl:  .  insulin glargine (LANTUS) 100 UNIT/ML injection, Inject 60 Units into the skin at bedtime. , Disp: , Rfl:  .  lisinopril (PRINIVIL,ZESTRIL) 40 MG tablet, Take 40 mg by mouth daily., Disp: , Rfl:  .  metFORMIN (GLUCOPHAGE) 500 MG tablet, Take 500 mg by mouth 2 (two) times daily with a meal., Disp: , Rfl:  .  Multiple Vitamins-Minerals (MULTIVITAMIN WITH MINERALS) tablet, Take 1 tablet by mouth daily., Disp: , Rfl:  .  niacin (SLO-NIACIN) 500 MG tablet, Take 500 mg by mouth 3 (three) times daily., Disp: , Rfl:  .  vitamin B-12 (CYANOCOBALAMIN) 1000 MCG tablet, Take 1,000 mcg by mouth daily., Disp: , Rfl:   ROS Cardiovascular: Denies chest pain  Respiratory: Denies dyspnea   OBJECTIVE: BP  122/82 (BP Location: Left Arm, Patient Position: Sitting, Cuff Size: Large)   Pulse 75   Temp 98.3 F (36.8 C) (Oral)   Ht 5\' 11"  (1.803 m)   Wt 260 lb 6 oz (118.1 kg)   SpO2 96%   BMI 36.31 kg/m   Constitutional: -  VS reviewed -  Well developed, well nourished, appears stated age -  No apparent distress  Psychiatric: -  Oriented to person, place, and time -  Memory intact -  Affect and mood normal -  Fluent conversation, good eye contact -  Judgment and insight age appropriate  Eye: -  Conjunctivae clear, no discharge -  Mild L eye droop compared to R -  Pupils symmetric, round, reactive to light  ENMT: -  MMM -  Pharynx moist, no exudate, no erythema     Neck: -  No gross swelling, no palpable  masses -  Thyroid midline, not enlarged, mobile, no palpable masses  Cardiovascular: -  RRR -  3/6 SM heard loudest at aortic listening post -  1+ pitting LE edema bl  Respiratory: -  Normal respiratory effort, no accessory muscle use, no retraction -  Breath sounds equal, no wheezes, no ronchi, no crackles  Skin: -  No significant lesion on inspection -  Warm and dry to palpation   ASSESSMENT/PLAN: Essential hypertension  Type 2 diabetes mellitus with diabetic neuropathy, with long-term current use of insulin (HCC)  Heart murmur  Hypertriglyceridemia  History of stroke  Patient instructed to sign release of records form from his previous PCP. Patient should return in 6 weeks to recheck BP. Home BP monitoring discussed and written down. Concerned his home monitor may not be working well. He is to bring it to his next appt.  The patient voiced understanding and agreement to the plan.   [place], DO 07/18/17  8:55 AM

## 2017-08-01 DIAGNOSIS — L300 Nummular dermatitis: Secondary | ICD-10-CM | POA: Diagnosis not present

## 2017-08-29 DIAGNOSIS — R69 Illness, unspecified: Secondary | ICD-10-CM | POA: Diagnosis not present

## 2017-08-31 ENCOUNTER — Ambulatory Visit (INDEPENDENT_AMBULATORY_CARE_PROVIDER_SITE_OTHER): Payer: Medicare HMO | Admitting: Family Medicine

## 2017-08-31 ENCOUNTER — Encounter: Payer: Self-pay | Admitting: Family Medicine

## 2017-08-31 VITALS — BP 122/72 | HR 68 | Temp 98.7°F | Ht 71.0 in | Wt 255.2 lb

## 2017-08-31 DIAGNOSIS — I10 Essential (primary) hypertension: Secondary | ICD-10-CM

## 2017-08-31 NOTE — Progress Notes (Signed)
Pre visit review using our clinic review tool, if applicable. No additional management support is needed unless otherwise documented below in the visit note. 

## 2017-08-31 NOTE — Patient Instructions (Signed)
Get your blood pressure monitor calibrated or get a new one. Ok to check BPs 1-2 times per week.   Let us know if you need anything.

## 2017-08-31 NOTE — Progress Notes (Signed)
Chief Complaint  Patient presents with  . Follow-up    Subjective Tommy Green is a 72 y.o. male who presents for hypertension follow up. He does monitor home blood pressures. Blood pressures ranging from 140-160's/60-70's on average. He is compliant with medications- HCTZ 25 mg/d, lisinopril 40 mg/d, atenolol. Patient has these side effects of medication: none He is sometimes adhering to a healthy diet overall. Current exercise: none; physically active at home   Past Medical History:  Diagnosis Date  . Essential hypertension 07/18/2017  . History of rheumatic fever   . History of stroke 07/18/2017  . Hypertriglyceridemia 07/18/2017  . Type 2 diabetes mellitus with diabetic neuropathy, unspecified ([place]) 07/18/2017   Medications Current Outpatient Medications on File Prior to Visit  Medication Sig Dispense Refill  . carvedilol (COREG) 12.5 MG tablet Take 12.5 mg 2 (two) times daily with a meal by mouth.    . DULoxetine (CYMBALTA) 30 MG capsule Take 30 mg daily by mouth.    . fenofibrate micronized (LOFIBRA) 200 MG capsule Take 200 mg by mouth daily before breakfast.    . gabapentin (NEURONTIN) 600 MG tablet Take 600 mg by mouth 3 (three) times daily.    . hydrochlorothiazide (HYDRODIURIL) 25 MG tablet Take 25 mg by mouth daily.    . Insulin Aspart (NOVOLOG [place]) Sliding scale    . Insulin Detemir (LEVEMIR FLEXPEN) 100 UNIT/ML Pen Inject 60 Units daily at 10 pm into the skin.    Marland Kitchen lisinopril (PRINIVIL,ZESTRIL) 40 MG tablet Take 40 mg by mouth daily.    . metFORMIN (GLUCOPHAGE) 500 MG tablet Take 500 mg by mouth 2 (two) times daily with a meal.    . Multiple Vitamins-Minerals (MULTIVITAMIN WITH MINERALS) tablet Take 1 tablet by mouth daily.    . vitamin B-12 (CYANOCOBALAMIN) 1000 MCG tablet Take 1,000 mcg by mouth daily.     Allergies No Known Allergies  Review of Systems Cardiovascular: no chest pain Respiratory:  no shortness of breath  Exam BP 122/72 (BP Location: Left  Arm, Patient Position: Sitting, Cuff Size: Normal)   Pulse 68   Temp 98.7 F (37.1 C) (Oral)   Ht 5\' 11"  (1.803 m)   Wt 255 lb 4 oz (115.8 kg)   SpO2 96%   BMI 35.60 kg/m  General:  well developed, well nourished, in no apparent distress Skin: warm, no pallor or diaphoresis Eyes: pupils equal and round, sclera anicteric without injection Heart: RRR, no bruits, no LE edema Lungs: clear to auscultation, no accessory muscle use Psych: well oriented with normal range of affect and appropriate judgment/insight  Essential hypertension  Cont regimen. Counseled on diet and exercise F/u in 6 mo or prn. The patient voiced understanding and agreement to the plan.  Greater than 15 minutes were spent face to face with the patient with greater than 50% of this time spent counseling on diet, exercise, and treatment options for HTN.    [place], DO 08/31/17  9:15 AM

## 2017-09-01 DIAGNOSIS — M1611 Unilateral primary osteoarthritis, right hip: Secondary | ICD-10-CM | POA: Diagnosis not present

## 2017-09-01 DIAGNOSIS — M25552 Pain in left hip: Secondary | ICD-10-CM | POA: Diagnosis not present

## 2017-09-01 DIAGNOSIS — M25551 Pain in right hip: Secondary | ICD-10-CM | POA: Diagnosis not present

## 2017-09-01 DIAGNOSIS — M1612 Unilateral primary osteoarthritis, left hip: Secondary | ICD-10-CM | POA: Diagnosis not present

## 2017-09-01 DIAGNOSIS — M160 Bilateral primary osteoarthritis of hip: Secondary | ICD-10-CM | POA: Diagnosis not present

## 2017-09-11 DIAGNOSIS — M1611 Unilateral primary osteoarthritis, right hip: Secondary | ICD-10-CM | POA: Insufficient documentation

## 2017-09-12 DIAGNOSIS — M1611 Unilateral primary osteoarthritis, right hip: Secondary | ICD-10-CM | POA: Diagnosis not present

## 2017-09-12 DIAGNOSIS — M1612 Unilateral primary osteoarthritis, left hip: Secondary | ICD-10-CM | POA: Diagnosis not present

## 2017-09-16 ENCOUNTER — Encounter: Payer: Self-pay | Admitting: Family Medicine

## 2017-09-16 DIAGNOSIS — E119 Type 2 diabetes mellitus without complications: Secondary | ICD-10-CM | POA: Diagnosis not present

## 2017-09-16 DIAGNOSIS — Z0100 Encounter for examination of eyes and vision without abnormal findings: Secondary | ICD-10-CM | POA: Diagnosis not present

## 2017-09-16 LAB — HM DIABETES EYE EXAM

## 2017-09-30 ENCOUNTER — Telehealth: Payer: Self-pay | Admitting: Family Medicine

## 2017-09-30 NOTE — Telephone Encounter (Signed)
Let [name] know I am happy to do this, but we need a reason and I don't have any rheumatologic issues in his history. TY.

## 2017-09-30 NOTE — Telephone Encounter (Signed)
Copied from [place]. Topic: Referral - Request >> Sep 30, 2017  2:09 PM Vernona Rieger wrote: Reason for CRM:  Pt is requesting a referral to Dr. Bo Merino for Rheumatologist. Please advise. [phone]   Called left message to call back.

## 2017-10-03 NOTE — Telephone Encounter (Signed)
Patient notified/he scheduled with PCP on 10/05/2017 at 9:45 am to discuss

## 2017-10-03 NOTE — Telephone Encounter (Signed)
Tommy Green 10/03/2017 08:33 AM  Summary: referral needed, he has had this problem before and it is spreading to other parts of body.    Dr. Len Childs has injected him twice in his hips for this.  [facility]  He said it use to be just his hips, but not it is in his arms, and other parts.  Patient is going to call [facility] and have them send the information to Dr.

## 2017-10-03 NOTE — Telephone Encounter (Signed)
I likely need to see him to order some labs, otherwise his referral will be rejected. Alternatively, we can refer him to another orthopod. TY.

## 2017-10-05 ENCOUNTER — Ambulatory Visit: Payer: Medicare HMO | Admitting: Family Medicine

## 2017-10-06 ENCOUNTER — Ambulatory Visit (INDEPENDENT_AMBULATORY_CARE_PROVIDER_SITE_OTHER): Payer: Medicare HMO | Admitting: Family Medicine

## 2017-10-06 ENCOUNTER — Encounter: Payer: Self-pay | Admitting: Family Medicine

## 2017-10-06 VITALS — BP 122/72 | HR 69 | Temp 98.0°F | Ht 71.0 in | Wt 256.4 lb

## 2017-10-06 DIAGNOSIS — M25552 Pain in left hip: Secondary | ICD-10-CM

## 2017-10-06 DIAGNOSIS — M25511 Pain in right shoulder: Secondary | ICD-10-CM

## 2017-10-06 DIAGNOSIS — M25551 Pain in right hip: Secondary | ICD-10-CM

## 2017-10-06 DIAGNOSIS — M7711 Lateral epicondylitis, right elbow: Secondary | ICD-10-CM

## 2017-10-06 MED ORDER — IBUPROFEN-FAMOTIDINE 800-26.6 MG PO TABS: 1.0000 | ORAL_TABLET | Freq: Two times a day (BID) | ORAL | 1 refills | Status: DC | PRN

## 2017-10-06 NOTE — Progress Notes (Signed)
Pre visit review using our clinic review tool, if applicable. No additional management support is needed unless otherwise documented below in the visit note. 

## 2017-10-06 NOTE — Patient Instructions (Addendum)
Get a forearm strap for tennis elbow.  EXERCISES  RANGE OF MOTION (ROM) AND STRETCHING EXERCISES These exercises may help you when beginning to rehabilitate your injury. While completing these exercises, remember:   Restoring tissue flexibility helps normal motion to return to the joints. This allows healthier, less painful movement and activity.  An effective stretch should be held for at least 30 seconds.  A stretch should never be painful. You should only feel a gentle lengthening or release in the stretched tissue.  ROM - Pendulum  Bend at the waist so that your right / left arm falls away from your body. Support yourself with your opposite hand on a solid surface, such as a table or a countertop.  Your right / left arm should be perpendicular to the ground. If it is not perpendicular, you need to lean over farther. Relax the muscles in your right / left arm and shoulder as much as possible.  Gently sway your hips and trunk so they move your right / left arm without any use of your right / left shoulder muscles.  Progress your movements so that your right / left arm moves side to side, then forward and backward, and finally, both clockwise and counterclockwise.  Complete 10-15 repetitions in each direction. Many people use this exercise to relieve discomfort in their shoulder as well as to gain range of motion. Repeat 2 times. Complete this exercise 3 times per week.  STRETCH - Flexion, Standing  Stand with good posture. With an underhand grip on your right / left hand and an overhand grip on the opposite hand, grasp a broomstick or cane so that your hands are a little more than shoulder-width apart.  Keeping your right / left elbow straight and shoulder muscles relaxed, push the stick with your opposite hand to raise your right / left arm in front of your body and then overhead. Raise your arm until you feel a stretch in your right / left shoulder, but before you have increased  shoulder pain.  Try to avoid shrugging your right / left shoulder as your arm rises by keeping your shoulder blade tucked down and toward your mid-back spine. Hold 30 seconds.  Slowly return to the starting position. Repeat 2 times. Complete this exercise 3 times per week.  STRETCH - Internal Rotation  Place your right / left hand behind your back, palm-up.  Throw a towel or belt over your opposite shoulder. Grasp the towel/belt with your right / left hand.  While keeping an upright posture, gently pull up on the towel/belt until you feel a stretch in the front of your right / left shoulder.  Avoid shrugging your right / left shoulder as your arm rises by keeping your shoulder blade tucked down and toward your mid-back spine.  Hold 30. Release the stretch by lowering your opposite hand. Repeat 2 times. Complete this exercise 3 times per week.  STRETCH - External Rotation and Abduction  Stagger your stance through a doorframe. It does not matter which foot is forward.  As instructed by your physician, physical therapist or athletic trainer, place your hands: ? And forearms above your head and on the door frame. ? And forearms at head-height and on the door frame. ? At elbow-height and on the door frame.  Keeping your head and chest upright and your stomach muscles tight to prevent over-extending your low-back, slowly shift your weight onto your front foot until you feel a stretch across your chest and/or in  the front of your shoulders.  Hold 30 seconds. Shift your weight to your back foot to release the stretch. Repeat 2 times. Complete this stretch 3 times per week.   STRENGTHENING EXERCISES  These exercises may help you when beginning to rehabilitate your injury. They may resolve your symptoms with or without further involvement from your physician, physical therapist or athletic trainer. While completing these exercises, remember:   Muscles can gain both the endurance and the  strength needed for everyday activities through controlled exercises.  Complete these exercises as instructed by your physician, physical therapist or athletic trainer. Progress the resistance and repetitions only as guided.  You may experience muscle soreness or fatigue, but the pain or discomfort you are trying to eliminate should never worsen during these exercises. If this pain does worsen, stop and make certain you are following the directions exactly. If the pain is still present after adjustments, discontinue the exercise until you can discuss the trouble with your clinician.  If advised by your physician, during your recovery, avoid activity or exercises which involve actions that place your right / left hand or elbow above your head or behind your back or head. These positions stress the tissues which are trying to heal.  STRENGTH - Scapular Depression and Adduction  With good posture, sit on a firm chair. Supported your arms in front of you with pillows, arm rests or a table top. Have your elbows in line with the sides of your body.  Gently draw your shoulder blades down and toward your mid-back spine. Gradually increase the tension without tensing the muscles along the top of your shoulders and the back of your neck.  Hold for 3 seconds. Slowly release the tension and relax your muscles completely before completing the next repetition.  After you have practiced this exercise, remove the arm support and complete it in standing as well as sitting. Repeat 2 times. Complete this exercise 3 times per week.   STRENGTH - External Rotators  Secure a rubber exercise band/tubing to a fixed object so that it is at the same height as your right / left elbow when you are standing or sitting on a firm surface.  Stand or sit so that the secured exercise band/tubing is at your side that is not injured.  Bend your elbow 90 degrees. Place a folded towel or small pillow under your right / left arm  so that your elbow is a few inches away from your side.  Keeping the tension on the exercise band/tubing, pull it away from your body, as if pivoting on your elbow. Be sure to keep your body steady so that the movement is only coming from your shoulder rotating.  Hold 3 seconds. Release the tension in a controlled manner as you return to the starting position. Repeat 2 times. Complete this exercise 3 times per week.   STRENGTH - Supraspinatus  Stand or sit with good posture. Grasp a 2-3 lb weight or an exercise band/tubing so that your hand is "thumbs-up," like when you shake hands.  Slowly lift your right / left hand from your thigh into the air, traveling about 30 degrees from straight out at your side. Lift your hand to shoulder height or as far as you can without increasing any shoulder pain. Initially, many people do not lift their hands above shoulder height.  Avoid shrugging your right / left shoulder as your arm rises by keeping your shoulder blade tucked down and toward your mid-back spine.  Hold for 3 seconds. Control the descent of your hand as you slowly return to your starting position. Repeat 2 times. Complete this exercise 3 times per week.   STRENGTH - Shoulder Extensors  Secure a rubber exercise band/tubing so that it is at the height of your shoulders when you are either standing or sitting on a firm arm-less chair.  With a thumbs-up grip, grasp an end of the band/tubing in each hand. Straighten your elbows and lift your hands straight in front of you at shoulder height. Step back away from the secured end of band/tubing until it becomes tense.  Squeezing your shoulder blades together, pull your hands down to the sides of your thighs. Do not allow your hands to go behind you.  Hold for 3 seconds. Slowly ease the tension on the band/tubing as you reverse the directions and return to the starting position. Repeat 2 times. Complete this exercise 3 times per week.    STRENGTH - Scapular Retractors  Secure a rubber exercise band/tubing so that it is at the height of your shoulders when you are either standing or sitting on a firm arm-less chair.  With a palm-down grip, grasp an end of the band/tubing in each hand. Straighten your elbows and lift your hands straight in front of you at shoulder height. Step back away from the secured end of band/tubing until it becomes tense.  Squeezing your shoulder blades together, draw your elbows back as you bend them. Keep your upper arm lifted away from your body throughout the exercise.  Hold 3 seconds. Slowly ease the tension on the band/tubing as you reverse the directions and return to the starting position. Repeat 2 times. Complete this exercise 3 times per week.  STRENGTH - Scapular Depressors  Find a sturdy chair without wheels, such as a from a dining room table.  Keeping your feet on the floor, lift your bottom from the seat and lock your elbows.  Keeping your elbows straight, allow gravity to pull your body weight down. Your shoulders will rise toward your ears.  Raise your body against gravity by drawing your shoulder blades down your back, shortening the distance between your shoulders and ears. Although your feet should always maintain contact with the floor, your feet should progressively support less body weight as you get stronger.  Hold 3 seconds. In a controlled and slow manner, lower your body weight to begin the next repetition. Repeat 2 times. Complete this exercise 3 times per week.   This information is not intended to replace advice given to you by your health care provider. Make sure you discuss any questions you have with your health care provider.  Document Released: 08/18/2005 Document Revised: 10/25/2014 Document Reviewed: 01/16/2009 Elsevier Interactive Patient Education 2016 [employer]    Hip Exercises Do exercises exactly as told by your health care provider and adjust  them as directed. It is normal to feel mild stretching, pulling, tightness, or discomfort as you do these exercises, but you should stop right away if you feel sudden pain or your pain gets worse.  STRETCHING AND RANGE OF MOTION EXERCISES These exercises warm up your muscles and joints and improve the movement and flexibility of your hip. These exercises also help to relieve pain, numbness, and tingling. Exercise A: Hamstrings, Supine  1. Lie on your back. 2. Loop a belt or towel over the ball of your left / rightfoot. The ball of your foot is on the walking surface, right under your toes.  3. Straighten your left / rightknee and slowly pull on the belt to raise your leg. ? Do not let your left / right knee bend while you do this. ? Keep your other leg flat on the floor. ? Raise the left / right leg until you feel a gentle stretch behind your left / right knee or thigh. 4. Hold this position for 30 seconds. 5. Slowly return your leg to the starting position. Repeat2 times. Complete this stretch 3 times per week. Exercise B: Hip Rotators  1. Lie on your back on a firm surface. 2. Hold your left / right knee with your left / right hand. Hold your ankle with your other hand. 3. Gently pull your left / right knee and rotate your lower leg toward your other shoulder. ? Pull until you feel a stretch in your buttocks. ? Keep your hips and shoulders firmly planted while you do this stretch. 4. Hold this position for 30 seconds. Repeat 2 times. Complete this stretch 3 times per week. Exercise C: V-Sit (Hamstrings and Adductors)  1. Sit on the floor with your legs extended in a large "V" shape. Keep your knees straight during this exercise. 2. Start with your head and chest upright, then bend at your waist to reach for your left foot (position A). You should feel a stretch in your right inner thigh. 3. Hold this position for 30 seconds. Then slowly return to the upright position. 4. Bend at your  waist to reach forward (position B). You should feel a stretch behind both of your thighs and knees. 5. Hold this position for 30 seconds. Then slowly return to the upright position. 6. Bend at your waist to reach for your right foot (position C). You should feel a stretch in your left inner thigh. 7. Hold this position for 30 seconds. Then slowly return to the upright position. Repeat A, B, and C 2 times each. Complete this stretch 3 times per week. Exercise D: Lunge (Hip Flexors)  1. Place your left / right knee on the floor and bend your other knee so that is directly over your ankle. You should be half-kneeling. 2. Keep good posture with your head over your shoulders. 3. Tighten your buttocks to point your tailbone downward. This helps your back to keep from arching too much. 4. You should feel a gentle stretch in the front of your left / right thigh and hip. If you do not feel any resistance, slightly slide your other foot forward and then slowly lunge forward so your knee once again lines up over your ankle. 5. Make sure your tailbone continues to point downward. 6. Hold this position for 30 seconds. Repeat 2 times. Complete this stretch 3 times per week.  STRENGTHENING EXERCISES These exercises build strength and endurance in your hip. Endurance is the ability to use your muscles for a long time, even after they get tired. Exercise E: Bridge (Hip Extensors)  1. Lie on your back on a firm surface with your knees bent and your feet flat on the floor. 2. Tighten your buttocks muscles and lift your bottom off the floor until the trunk of your body is level with your thighs. ? Do not arch your back. ? You should feel the muscles working in your buttocks and the back of your thighs. If you do not feel these muscles, slide your feet 1-2 inches (2.5-5 cm) farther away from your buttocks. 3. Hold this position for 3 seconds. 4. Slowly lower  your hips to the starting position. Repeat for a total  of 10 repetitions. 5. Let your muscles relax completely between repetitions. 6. If this exercise is too easy, try doing it with your arms crossed over your chest. Repeat 2 times. Complete this exercise 3 times per week. Exercise F: Straight Leg Raises - Hip Abductors  1. Lie on your side with your left / right leg in the top position. Lie so your head, shoulder, knee, and hip line up with each other. You may bend your bottom knee to help you balance. 2. Roll your hips slightly forward, so your hips are stacked directly over each other and your left / right knee is facing forward. 3. Leading with your heel, lift your top leg 4-6 inches (10-15 cm). You should feel the muscles in your outer hip lifting. ? Do not let your foot drift forward. ? Do not let your knee roll toward the ceiling. 4. Hold this position for 1 second. 5. Slowly return to the starting position. 6. Let your muscles relax completely between repetitions. Repeat for a total of 10 repetitions.  Repeat 2 times. Complete this exercise 3 times per week. Exercise G: Straight Leg Raises - Hip Adductors  1. Lie on your side with your left / right leg in the bottom position. Lie so your head, shoulder, knee, and hip line up. You may place your upper foot in front to help you balance. 2. Roll your hips slightly forward, so your hips are stacked directly over each other and your left / right knee is facing forward. 3. Tense the muscles in your inner thigh and lift your bottom leg 4-6 inches (10-15 cm). 4. Hold this position for 1 second. 5. Slowly return to the starting position. 6. Let your muscles relax completely between repetitions. Repeat for a total of 10 repetitions. Repeat 2 times. Complete this exercise 3 times per week. Exercise H: Straight Leg Raises - Quadriceps  1. Lie on your back with your left / right leg extended and your other knee bent. 2. Tense the muscles in the front of your left / right thigh. When you do this,  you should see your kneecap slide up or see increased dimpling just above your knee. 3. Tighten these muscles even more and raise your leg 4-6 inches (10-15 cm) off the floor. 4. Hold this position for 3 seconds. 5. Keep these muscles tense as you lower your leg. 6. Relax the muscles slowly and completely between repetitions. Repeat for a total of 10 repetitions. Repeat 2 times. Complete this exercise 3 times per week. Exercise I: Hip Abductors, Standing 1. Tie one end of a rubber exercise band or tubing to a secure surface, such as a table or pole. 2. Loop the other end of the band or tubing around your left / right ankle. 3. Keeping your ankle with the band or tubing directly opposite of the secured end, step away until there is tension in the tubing or band. Hold onto a chair as needed for balance. 4. Lift your left / right leg out to your side. While you do this: ? Keep your back upright. ? Keep your shoulders over your hips. ? Keep your toes pointing forward. ? Make sure to use your hip muscles to lift your leg. Do not "throw" your leg or tip your body to lift your leg. 5. Hold this position for 1 second. 6. Slowly return to the starting position. Repeat for a total of 10 repetitions.  Repeat 2 times. Complete this exercise 3 times per week. Exercise J: Squats (Quadriceps) 1. Stand in a door frame so your feet and knees are in line with the frame. You may place your hands on the frame for balance. 2. Slowly bend your knees and lower your hips like you are going to sit in a chair. ? Keep your lower legs in a straight-up-and-down position. ? Do not let your hips go lower than your knees. ? Do not bend your knees lower than told by your health care provider. ? If your hip pain increases, do not bend as low. 3. Hold this position for 1 second. 4. Slowly push with your legs to return to standing. Do not use your hands to pull yourself to standing. Repeat for a total of 10  repetitions. Repeat 2 times. Complete this exercise 3 times per week. This information is not intended to replace advice given to you by your health care provider. Make sure you discuss any questions you have with your health care provider. Document Released: 10/22/2005 Document Revised: 06/28/2016 Document Reviewed: 09/29/2015 Elsevier Interactive Patient Education  [employer].

## 2017-10-06 NOTE — Progress Notes (Signed)
Chief Complaint  Patient presents with  . Arthritis    pain    Subjective: Patient is a 71 y.o. male here for arthritis pain.  The patient has long-standing bilateral hip pain that he sees an orthopedic surgeon for.  He has been receiving what he believes are his cortisone injections, his most recent one at the end of November.  The last set of injections helped him for around 1 week.  His right hip is worse than the left.  He feels somewhat weaker when he stands up.  He denies any numbness or tingling.  He is requesting a combination ibuprofen/famotidine to help.  Over the past several weeks, he is also noted right shoulder and elbow pain. No injury or change in activity. OTC NSAIDs not helpful. No swelling, numbness, tingling, bruising.   ROS: MSK: As noted in HPI Neuro: No numbness, tingling  Family History  Problem Relation Age of Onset  . Hyperlipidemia Mother   . Hyperlipidemia Father   . Cancer Neg Hx    Past Medical History:  Diagnosis Date  . Essential hypertension 07/18/2017  . History of rheumatic fever   . History of stroke 07/18/2017  . Hypertriglyceridemia 07/18/2017  . Type 2 diabetes mellitus with diabetic neuropathy, unspecified ([place]) 07/18/2017   No Known Allergies  Current Outpatient Medications:  .  ALPHA LIPOIC ACID PO, Take 400 mg 2 (two) times daily by mouth., Disp: , Rfl:  .  carvedilol (COREG) 12.5 MG tablet, Take 12.5 mg 2 (two) times daily with a meal by mouth., Disp: , Rfl:  .  DULoxetine (CYMBALTA) 30 MG capsule, Take 30 mg daily by mouth., Disp: , Rfl:  .  fenofibrate micronized (LOFIBRA) 200 MG capsule, Take 200 mg by mouth daily before breakfast., Disp: , Rfl:  .  gabapentin (NEURONTIN) 600 MG tablet, Take 600 mg by mouth 3 (three) times daily., Disp: , Rfl:  .  hydrochlorothiazide (HYDRODIURIL) 25 MG tablet, Take 25 mg by mouth daily., Disp: , Rfl:  .  Insulin Aspart (NOVOLOG [place]), Sliding scale, Disp: , Rfl:  .  Insulin Detemir (LEVEMIR FLEXPEN)  100 UNIT/ML Pen, Inject 60 Units daily at 10 pm into the skin., Disp: , Rfl:  .  lisinopril (PRINIVIL,ZESTRIL) 40 MG tablet, Take 40 mg by mouth daily., Disp: , Rfl:  .  metFORMIN (GLUCOPHAGE) 500 MG tablet, Take 500 mg by mouth 2 (two) times daily with a meal., Disp: , Rfl:  .  Multiple Vitamins-Minerals (MULTIVITAMIN WITH MINERALS) tablet, Take 1 tablet by mouth daily., Disp: , Rfl:  .  vitamin B-12 (CYANOCOBALAMIN) 1000 MCG tablet, Take 1,000 mcg by mouth daily., Disp: , Rfl:  .  Ibuprofen-Famotidine 800-26.6 MG TABS, Take 1 tablet by mouth every 12 (twelve) hours as needed., Disp: 60 tablet, Rfl: 1  Objective: BP 122/72 (BP Location: Left Arm, Patient Position: Sitting, Cuff Size: Large)   Pulse 69   Temp 98 F (36.7 C) (Oral)   Ht 5\' 11"  (1.803 m)   Wt 256 lb 6 oz (116.3 kg)   SpO2 97%   BMI 35.76 kg/m  General: Awake, appears stated age HEENT: MMM, EOMi Heart: RRR, no murmurs Lungs: CTAB, no rales, wheezes or rhonchi. No accessory muscle use MSK: TTP over R trap and lat epicondyl; pain with resisted wrist dorsiflexion on R; also ttp over great troch bursa b/l; +Neer's, Empty can, [place], neg cross over; +Stinchfield b/l, neg log roll, FABER, FADDIR Psych: Age appropriate judgment and insight, normal affect and mood  Assessment and Plan: Lateral epicondylitis of right elbow - Plan: Ibuprofen-Famotidine 800-26.6 MG TABS  Acute pain of right shoulder - Plan: Ibuprofen-Famotidine 800-26.6 MG TABS  Bilateral hip pain - Plan: Ibuprofen-Famotidine 800-26.6 MG TABS  Orders as above. Elbow strap. Try nsaid as above. Ice. Home stretches/exercises.  F/u in 1 week, will discuss imaging for shoulder vs injections for hips if no better. The patient voiced understanding and agreement to the plan.  [place], DO 10/06/17  11:22 AM

## 2017-10-13 ENCOUNTER — Ambulatory Visit (INDEPENDENT_AMBULATORY_CARE_PROVIDER_SITE_OTHER): Payer: Medicare HMO | Admitting: Family Medicine

## 2017-10-13 ENCOUNTER — Encounter: Payer: Self-pay | Admitting: Family Medicine

## 2017-10-13 VITALS — BP 140/82 | HR 74 | Temp 98.6°F | Ht 71.0 in | Wt 256.2 lb

## 2017-10-13 DIAGNOSIS — M25551 Pain in right hip: Secondary | ICD-10-CM | POA: Diagnosis not present

## 2017-10-13 DIAGNOSIS — M25511 Pain in right shoulder: Secondary | ICD-10-CM | POA: Diagnosis not present

## 2017-10-13 DIAGNOSIS — M25512 Pain in left shoulder: Secondary | ICD-10-CM

## 2017-10-13 NOTE — Progress Notes (Signed)
Chief Complaint  Patient presents with  . Follow-up    Subjective: Patient is a 71 y.o. male here for f/u jt pain.  F/u elbow, hip and    ROS: Heart: Denies chest pain  Lungs: Denies SOB   Family History  Problem Relation Age of Onset  . Hyperlipidemia Mother   . Hyperlipidemia Father   . Cancer Neg Hx    Past Medical History:  Diagnosis Date  . Essential hypertension 07/18/2017  . History of rheumatic fever   . History of stroke 07/18/2017  . Hypertriglyceridemia 07/18/2017  . Type 2 diabetes mellitus with diabetic neuropathy, unspecified ([place]) 07/18/2017   No Known Allergies  Current Outpatient Medications:  .  ALPHA LIPOIC ACID PO, Take 400 mg 2 (two) times daily by mouth., Disp: , Rfl:  .  carvedilol (COREG) 12.5 MG tablet, Take 12.5 mg 2 (two) times daily with a meal by mouth., Disp: , Rfl:  .  DULoxetine (CYMBALTA) 30 MG capsule, Take 30 mg daily by mouth., Disp: , Rfl:  .  fenofibrate micronized (LOFIBRA) 200 MG capsule, Take 200 mg by mouth daily before breakfast., Disp: , Rfl:  .  gabapentin (NEURONTIN) 600 MG tablet, Take 600 mg by mouth 3 (three) times daily., Disp: , Rfl:  .  hydrochlorothiazide (HYDRODIURIL) 25 MG tablet, Take 25 mg by mouth daily., Disp: , Rfl:  .  Ibuprofen-Famotidine 800-26.6 MG TABS, Take 1 tablet by mouth every 12 (twelve) hours as needed., Disp: 60 tablet, Rfl: 1 .  Insulin Aspart (NOVOLOG [place]), Sliding scale, Disp: , Rfl:  .  Insulin Detemir (LEVEMIR FLEXPEN) 100 UNIT/ML Pen, Inject 60 Units daily at 10 pm into the skin., Disp: , Rfl:  .  lisinopril (PRINIVIL,ZESTRIL) 40 MG tablet, Take 40 mg by mouth daily., Disp: , Rfl:  .  metFORMIN (GLUCOPHAGE) 500 MG tablet, Take 500 mg by mouth 2 (two) times daily with a meal., Disp: , Rfl:  .  Multiple Vitamins-Minerals (MULTIVITAMIN WITH MINERALS) tablet, Take 1 tablet by mouth daily., Disp: , Rfl:  .  vitamin B-12 (CYANOCOBALAMIN) 1000 MCG tablet, Take 1,000 mcg by mouth daily., Disp: , Rfl:    Objective: BP 140/82 (BP Location: Left Arm, Patient Position: Sitting, Cuff Size: Large)   Pulse 74   Temp 98.6 F (37 C) (Oral)   Ht 5\' 11"  (1.803 m)   Wt 256 lb 4 oz (116.2 kg)   SpO2 94%   BMI 35.74 kg/m  General: Awake, appears stated age Lungs: No accessory muscle use MSK: Nml rom of shoulders, neg Neer's, Empty Can, Hawkins, Cross over b/l; no ttp over lat epicondyl, +TTP over R greater troch, +Stinchfield on R, neg FADDIR, FABER, Ober's Psych: Age appropriate judgment and insight, normal affect and mood  Assessment and Plan: Pain of right hip joint  Bilateral shoulder pain, unspecified chronicity  Cont Ibuprofen prn. Ice, heat, stretches/exercises. He is improving on all fronts, offered steroid injection for hip, but it is not bothering him enough at this point.  F/u prn.  The patient voiced understanding and agreement to the plan.  [place], DO 10/13/17  9:23 AM

## 2017-10-13 NOTE — Progress Notes (Signed)
Pre visit review using our clinic review tool, if applicable. No additional management support is needed unless otherwise documented below in the visit note. 

## 2017-10-13 NOTE — Patient Instructions (Addendum)
Continue heat, stretches/exercises for upper extremities.   Hip Exercises It is normal to feel mild stretching, pulling, tightness, or discomfort as you do these exercises, but you should stop right away if you feel sudden pain or your pain gets worse.  STRETCHING AND RANGE OF MOTION EXERCISES These exercises warm up your muscles and joints and improve the movement and flexibility of your hip. These exercises also help to relieve pain, numbness, and tingling. Exercise A: Hamstrings, Supine  1. Lie on your back. 2. Loop a belt or towel over the ball of your left / rightfoot. The ball of your foot is on the walking surface, right under your toes. 3. Straighten your left / rightknee and slowly pull on the belt to raise your leg. ? Do not let your left / right knee bend while you do this. ? Keep your other leg flat on the floor. ? Raise the left / right leg until you feel a gentle stretch behind your left / right knee or thigh. 4. Hold this position for 30 seconds. 5. Slowly return your leg to the starting position. Repeat2 times. Complete this stretch 3 times per week. Exercise B: Hip Rotators  1. Lie on your back on a firm surface. 2. Hold your left / right knee with your left / right hand. Hold your ankle with your other hand. 3. Gently pull your left / right knee and rotate your lower leg toward your other shoulder. ? Pull until you feel a stretch in your buttocks. ? Keep your hips and shoulders firmly planted while you do this stretch. 4. Hold this position for 30 seconds. Repeat 2 times. Complete this stretch 3 times per week. Exercise C: V-Sit (Hamstrings and Adductors)  1. Sit on the floor with your legs extended in a large "V" shape. Keep your knees straight during this exercise. 2. Start with your head and chest upright, then bend at your waist to reach for your left foot (position A). You should feel a stretch in your right inner thigh. 3. Hold this position for 30 seconds.  Then slowly return to the upright position. 4. Bend at your waist to reach forward (position B). You should feel a stretch behind both of your thighs and knees. 5. Hold this position for 30 seconds. Then slowly return to the upright position. 6. Bend at your waist to reach for your right foot (position C). You should feel a stretch in your left inner thigh. 7. Hold this position for 30 seconds. Then slowly return to the upright position. Repeat A, B, and C 2 times each. Complete this stretch 3 times per week. Exercise D: Lunge (Hip Flexors)  1. Place your left / right knee on the floor and bend your other knee so that is directly over your ankle. You should be half-kneeling. 2. Keep good posture with your head over your shoulders. 3. Tighten your buttocks to point your tailbone downward. This helps your back to keep from arching too much. 4. You should feel a gentle stretch in the front of your left / right thigh and hip. If you do not feel any resistance, slightly slide your other foot forward and then slowly lunge forward so your knee once again lines up over your ankle. 5. Make sure your tailbone continues to point downward. 6. Hold this position for 30 seconds. Repeat 2 times. Complete this stretch 3 times per week.  STRENGTHENING EXERCISES These exercises build strength and endurance in your hip. Endurance is the  ability to use your muscles for a long time, even after they get tired. Exercise E: Bridge (Hip Extensors)  1. Lie on your back on a firm surface with your knees bent and your feet flat on the floor. 2. Tighten your buttocks muscles and lift your bottom off the floor until the trunk of your body is level with your thighs. ? Do not arch your back. ? You should feel the muscles working in your buttocks and the back of your thighs. If you do not feel these muscles, slide your feet 1-2 inches (2.5-5 cm) farther away from your buttocks. 3. Hold this position for 3 seconds. 4. Slowly  lower your hips to the starting position. Repeat for a total of 10 repetitions. 5. Let your muscles relax completely between repetitions. 6. If this exercise is too easy, try doing it with your arms crossed over your chest. Repeat 2 times. Complete this exercise 3 times per week. Exercise F: Straight Leg Raises - Hip Abductors  1. Lie on your side with your left / right leg in the top position. Lie so your head, shoulder, knee, and hip line up with each other. You may bend your bottom knee to help you balance. 2. Roll your hips slightly forward, so your hips are stacked directly over each other and your left / right knee is facing forward. 3. Leading with your heel, lift your top leg 4-6 inches (10-15 cm). You should feel the muscles in your outer hip lifting. ? Do not let your foot drift forward. ? Do not let your knee roll toward the ceiling. 4. Hold this position for 1 second. 5. Slowly return to the starting position. 6. Let your muscles relax completely between repetitions. Repeat for a total of 10 repetitions.  Repeat 2 times. Complete this exercise 3 times per week. Exercise G: Straight Leg Raises - Hip Adductors  1. Lie on your side with your left / right leg in the bottom position. Lie so your head, shoulder, knee, and hip line up. You may place your upper foot in front to help you balance. 2. Roll your hips slightly forward, so your hips are stacked directly over each other and your left / right knee is facing forward. 3. Tense the muscles in your inner thigh and lift your bottom leg 4-6 inches (10-15 cm). 4. Hold this position for 1 second. 5. Slowly return to the starting position. 6. Let your muscles relax completely between repetitions. Repeat for a total of 10 repetitions. Repeat 2 times. Complete this exercise 3 times per week. Exercise H: Straight Leg Raises - Quadriceps  1. Lie on your back with your left / right leg extended and your other knee bent. 2. Tense the muscles  in the front of your left / right thigh. When you do this, you should see your kneecap slide up or see increased dimpling just above your knee. 3. Tighten these muscles even more and raise your leg 4-6 inches (10-15 cm) off the floor. 4. Hold this position for 3 seconds. 5. Keep these muscles tense as you lower your leg. 6. Relax the muscles slowly and completely between repetitions. Repeat for a total of 10 repetitions. Repeat 2 times. Complete this exercise 3 times per week. Exercise I: Hip Abductors, Standing 1. Tie one end of a rubber exercise band or tubing to a secure surface, such as a table or pole. 2. Loop the other end of the band or tubing around your left / right  ankle. 3. Keeping your ankle with the band or tubing directly opposite of the secured end, step away until there is tension in the tubing or band. Hold onto a chair as needed for balance. 4. Lift your left / right leg out to your side. While you do this: ? Keep your back upright. ? Keep your shoulders over your hips. ? Keep your toes pointing forward. ? Make sure to use your hip muscles to lift your leg. Do not "throw" your leg or tip your body to lift your leg. 5. Hold this position for 1 second. 6. Slowly return to the starting position. Repeat for a total of 10 repetitions. Repeat 2 times. Complete this exercise 3 times per week. Exercise J: Squats (Quadriceps) 1. Stand in a door frame so your feet and knees are in line with the frame. You may place your hands on the frame for balance. 2. Slowly bend your knees and lower your hips like you are going to sit in a chair. ? Keep your lower legs in a straight-up-and-down position. ? Do not let your hips go lower than your knees. ? Do not bend your knees lower than told by your health care provider. ? If your hip pain increases, do not bend as low. 3. Hold this position for 1 second. 4. Slowly push with your legs to return to standing. Do not use your hands to pull  yourself to standing. Repeat for a total of 10 repetitions. Repeat 2 times. Complete this exercise 3 times per week. Make sure you discuss any questions you have with your health care provider. Document Released: 10/22/2005 Document Revised: 06/28/2016 Document Reviewed: 09/29/2015 Elsevier Interactive Patient Education  [employer].

## 2017-10-24 ENCOUNTER — Encounter: Payer: Self-pay | Admitting: Family Medicine

## 2017-10-24 ENCOUNTER — Ambulatory Visit (INDEPENDENT_AMBULATORY_CARE_PROVIDER_SITE_OTHER): Payer: Medicare HMO | Admitting: Family Medicine

## 2017-10-24 VITALS — BP 122/70 | HR 63 | Temp 97.5°F | Ht 71.0 in | Wt 253.0 lb

## 2017-10-24 DIAGNOSIS — G8929 Other chronic pain: Secondary | ICD-10-CM

## 2017-10-24 DIAGNOSIS — R69 Illness, unspecified: Secondary | ICD-10-CM | POA: Diagnosis not present

## 2017-10-24 DIAGNOSIS — M79641 Pain in right hand: Secondary | ICD-10-CM | POA: Diagnosis not present

## 2017-10-24 DIAGNOSIS — M25511 Pain in right shoulder: Secondary | ICD-10-CM | POA: Diagnosis not present

## 2017-10-24 DIAGNOSIS — M7711 Lateral epicondylitis, right elbow: Secondary | ICD-10-CM | POA: Diagnosis not present

## 2017-10-24 DIAGNOSIS — M7061 Trochanteric bursitis, right hip: Secondary | ICD-10-CM

## 2017-10-24 DIAGNOSIS — M79642 Pain in left hand: Secondary | ICD-10-CM | POA: Diagnosis not present

## 2017-10-24 LAB — VITAMIN D 25 HYDROXY (VIT D DEFICIENCY, FRACTURES): VITD: 26.88 ng/mL — ABNORMAL LOW (ref 30.00–100.00)

## 2017-10-24 MED ORDER — METHYLPREDNISOLONE ACETATE 40 MG/ML IJ SUSP
40.0000 mg | Freq: Once | INTRAMUSCULAR | Status: AC
  Administered 2017-10-24: 40 mg via INTRAMUSCULAR

## 2017-10-24 NOTE — Addendum Note (Signed)
Addended by: Sharon Seller B on: 10/24/2017 10:54 AM   Modules accepted: Orders

## 2017-10-24 NOTE — Progress Notes (Signed)
Chief Complaint  Patient presents with  . Pain    Subjective: Patient is a 72 y.o. male here for f/u pain.  B/l shoulder pain Worse on R, had been better since going on NSAID, but got worse last week. No injury or change in activity. No numbness or tingling. His wife is worried that he may have RA. He has never had lab studies for AI arthritis. DIP's affected on R hand more than MCP or PIPs. Aunt has RA. No first deg relatives. R hip pain also worse as well as elbow pain. He stopped wearing his forearm strap. No rashes or swelling. When he sits for long periods of time, he will have worsening hip pain and swelling. His L side is starting to hurt him due to compensating for the R side.   ROS: MSK: As noted in HPI Neuro: No numbness or tingling  Family History  Problem Relation Age of Onset  . Hyperlipidemia Mother   . Hyperlipidemia Father   . Cancer Neg Hx    Past Medical History:  Diagnosis Date  . Essential hypertension 07/18/2017  . History of rheumatic fever   . History of stroke 07/18/2017  . Hypertriglyceridemia 07/18/2017  . Type 2 diabetes mellitus with diabetic neuropathy, unspecified ([place]) 07/18/2017   No Known Allergies  Current Outpatient Medications:  .  ALPHA LIPOIC ACID PO, Take 400 mg 2 (two) times daily by mouth., Disp: , Rfl:  .  carvedilol (COREG) 12.5 MG tablet, Take 12.5 mg 2 (two) times daily with a meal by mouth., Disp: , Rfl:  .  DULoxetine (CYMBALTA) 30 MG capsule, Take 30 mg daily by mouth., Disp: , Rfl:  .  fenofibrate micronized (LOFIBRA) 200 MG capsule, Take 200 mg by mouth daily before breakfast., Disp: , Rfl:  .  gabapentin (NEURONTIN) 600 MG tablet, Take 600 mg by mouth 3 (three) times daily., Disp: , Rfl:  .  hydrochlorothiazide (HYDRODIURIL) 25 MG tablet, Take 25 mg by mouth daily., Disp: , Rfl:  .  Ibuprofen-Famotidine 800-26.6 MG TABS, Take 1 tablet by mouth every 12 (twelve) hours as needed., Disp: 60 tablet, Rfl: 1 .  Insulin Aspart (NOVOLOG  [place]), Sliding scale, Disp: , Rfl:  .  Insulin Detemir (LEVEMIR FLEXPEN) 100 UNIT/ML Pen, Inject 60 Units daily at 10 pm into the skin., Disp: , Rfl:  .  lisinopril (PRINIVIL,ZESTRIL) 40 MG tablet, Take 40 mg by mouth daily., Disp: , Rfl:  .  metFORMIN (GLUCOPHAGE) 500 MG tablet, Take 500 mg by mouth 2 (two) times daily with a meal., Disp: , Rfl:  .  Multiple Vitamins-Minerals (MULTIVITAMIN WITH MINERALS) tablet, Take 1 tablet by mouth daily., Disp: , Rfl:  .  vitamin B-12 (CYANOCOBALAMIN) 1000 MCG tablet, Take 1,000 mcg by mouth daily., Disp: , Rfl:   Objective: BP 122/70 (BP Location: Right Arm, Patient Position: Sitting, Cuff Size: Large)   Pulse 63   Temp (!) 97.5 F (36.4 C) (Oral)   Ht 5\' 11"  (1.803 m)   Wt 253 lb (114.8 kg)   SpO2 97%   BMI 35.29 kg/m  General: Awake, appears stated age HEENT: MMM, EOMi Heart: Brisk cap refill Lungs: No accessory muscle use MSK: R shoulder- +Neer's, Hawkins, Empty can; neg lift off, Speed's, cross over; neg exam on L; R hip ttp over the greater troch, neg log roll, Stinchfield, FABER, FADDIR, neg L hip exam, +TTP over R epicondyl, pain with resisted wrist extension; R hand- +TTP over DIP of 2-5 digits, no edema, no  erythema/excessive warmth, decreased ROM flexion of digits 3-5, normal L hand exam Neuro: Sensation intact b/l Psych: Age appropriate judgment and insight, normal affect and mood  Procedure Note; Shoulder joint injection Verbal consent obtained. The area was palpated, an area was marked postero-lateral to the acromion, and cleaned with alcohol x1. Freeze spray used. A 27-gauge needle, while aiming towards the coracoid process, was used to enter the joint posteriorally with ease. 40 mg of Depo with 2 mL of 1% lidocaine was injected. A bandaid was placed. The patient tolerated the procedure well. There were no complications noted.  Procedure note: Greater trochanteric bursa injection Verbal consent obtained. The area of interest was  palpated and demarcated with an otoscope speculum. It was cleaned with an alcohol swab. Freeze spray was used. A 27 g needle was inserted at a perpendicular angle through the area of interested. The plunger was withdrawn to ensure our placement was not in a vessel. 2 mL of 1% lidocaine without epi and 40 mg of Depo was injected. A bandaid was placed. The patient tolerated the procedure well.  There were no complications noted.    Assessment and Plan: Trochanteric bursitis of right hip  Chronic right shoulder pain  Lateral epicondylitis of right elbow  Pain in both hands - Plan: ANA,IFA RA Diag Pnl w/rflx Tit/Patn, Rheumatoid Factor, Vitamin D (25 hydroxy)  Orders as above. Inject today, check for autoimmune issues, though I said to the pt and his wife that his clinical presentation is not highly suggestive of RA. Ice. Wear forearm strap again. F/u prn.  The patient voiced understanding and agreement to the plan.  [place], DO 10/24/17  10:33 AM

## 2017-10-24 NOTE — Patient Instructions (Addendum)
Ice/cold pack over area for 10-15 min every 2-3 hours while awake.  OK to take Tylenol 1000 mg (2 extra strength tabs) or 975 mg (3 regular strength tabs) every 6 hours as needed.  Wear your forearm strap again.  Give [place] 2-3 business days to get the results of your labs back. If labs are normal, you will likely receive a letter in the mail unless you have MyChart. This can take longer than 2-3 business days.   Let us know if you need anything.

## 2017-10-24 NOTE — Progress Notes (Signed)
Pre visit review using our clinic review tool, if applicable. No additional management support is needed unless otherwise documented below in the visit note. 

## 2017-10-25 DIAGNOSIS — E1165 Type 2 diabetes mellitus with hyperglycemia: Secondary | ICD-10-CM | POA: Diagnosis not present

## 2017-10-25 DIAGNOSIS — E559 Vitamin D deficiency, unspecified: Secondary | ICD-10-CM | POA: Diagnosis not present

## 2017-10-25 DIAGNOSIS — E781 Pure hyperglyceridemia: Secondary | ICD-10-CM | POA: Diagnosis not present

## 2017-10-25 DIAGNOSIS — D649 Anemia, unspecified: Secondary | ICD-10-CM | POA: Diagnosis not present

## 2017-10-25 LAB — ANA,IFA RA DIAG PNL W/RFLX TIT/PATN
Anti Nuclear Antibody(ANA): NEGATIVE
CYCLIC CITRULLIN PEPTIDE AB: 17 U

## 2017-10-28 DIAGNOSIS — E1142 Type 2 diabetes mellitus with diabetic polyneuropathy: Secondary | ICD-10-CM | POA: Diagnosis not present

## 2017-10-28 DIAGNOSIS — E1169 Type 2 diabetes mellitus with other specified complication: Secondary | ICD-10-CM | POA: Diagnosis not present

## 2017-10-28 DIAGNOSIS — E1122 Type 2 diabetes mellitus with diabetic chronic kidney disease: Secondary | ICD-10-CM | POA: Diagnosis not present

## 2017-10-28 DIAGNOSIS — R809 Proteinuria, unspecified: Secondary | ICD-10-CM | POA: Diagnosis not present

## 2017-10-28 DIAGNOSIS — N183 Chronic kidney disease, stage 3 unspecified: Secondary | ICD-10-CM | POA: Insufficient documentation

## 2017-10-28 DIAGNOSIS — I129 Hypertensive chronic kidney disease with stage 1 through stage 4 chronic kidney disease, or unspecified chronic kidney disease: Secondary | ICD-10-CM | POA: Diagnosis not present

## 2017-10-28 DIAGNOSIS — E559 Vitamin D deficiency, unspecified: Secondary | ICD-10-CM | POA: Diagnosis not present

## 2017-10-28 DIAGNOSIS — E785 Hyperlipidemia, unspecified: Secondary | ICD-10-CM | POA: Diagnosis not present

## 2017-10-28 DIAGNOSIS — D509 Iron deficiency anemia, unspecified: Secondary | ICD-10-CM | POA: Diagnosis not present

## 2017-11-02 ENCOUNTER — Other Ambulatory Visit: Payer: Self-pay | Admitting: Family Medicine

## 2017-11-02 MED ORDER — FENOFIBRATE MICRONIZED 200 MG PO CAPS: 200.0000 mg | ORAL_CAPSULE | Freq: Every day | ORAL | 1 refills | Status: DC

## 2017-11-03 DIAGNOSIS — K219 Gastro-esophageal reflux disease without esophagitis: Secondary | ICD-10-CM | POA: Diagnosis not present

## 2017-11-03 DIAGNOSIS — D649 Anemia, unspecified: Secondary | ICD-10-CM | POA: Diagnosis not present

## 2017-11-03 DIAGNOSIS — Z791 Long term (current) use of non-steroidal anti-inflammatories (NSAID): Secondary | ICD-10-CM | POA: Diagnosis not present

## 2017-11-07 ENCOUNTER — Other Ambulatory Visit: Payer: Self-pay | Admitting: Family Medicine

## 2017-11-07 MED ORDER — GABAPENTIN 600 MG PO TABS: 600.0000 mg | ORAL_TABLET | Freq: Three times a day (TID) | ORAL | 5 refills | Status: DC

## 2017-11-07 MED ORDER — CARVEDILOL 12.5 MG PO TABS: 12.5000 mg | ORAL_TABLET | Freq: Two times a day (BID) | ORAL | 1 refills | Status: DC

## 2017-11-21 DIAGNOSIS — K648 Other hemorrhoids: Secondary | ICD-10-CM | POA: Diagnosis not present

## 2017-11-21 DIAGNOSIS — K5730 Diverticulosis of large intestine without perforation or abscess without bleeding: Secondary | ICD-10-CM | POA: Diagnosis not present

## 2017-11-21 DIAGNOSIS — K219 Gastro-esophageal reflux disease without esophagitis: Secondary | ICD-10-CM | POA: Diagnosis not present

## 2017-11-21 DIAGNOSIS — D126 Benign neoplasm of colon, unspecified: Secondary | ICD-10-CM | POA: Diagnosis not present

## 2017-11-21 DIAGNOSIS — D500 Iron deficiency anemia secondary to blood loss (chronic): Secondary | ICD-10-CM | POA: Diagnosis not present

## 2017-11-21 DIAGNOSIS — D123 Benign neoplasm of transverse colon: Secondary | ICD-10-CM | POA: Diagnosis not present

## 2017-11-28 ENCOUNTER — Other Ambulatory Visit: Payer: Self-pay | Admitting: Family Medicine

## 2017-11-28 NOTE — Telephone Encounter (Signed)
Copied from [place]. Topic: Quick Communication - Rx Refill/Question >> Nov 28, 2017  2:45 PM Wynetta Emery, [place] C wrote: Medication: generic for celebrax    Has the patient contacted their pharmacy? No    (Agent: If no, request that the patient contact the pharmacy for the refill.)   Preferred Pharmacy (with phone number or street name): [facility]    Agent: Please be advised that RX refills may take up to 3 business days. We ask that you follow-up with your pharmacy.

## 2017-11-29 DIAGNOSIS — Z791 Long term (current) use of non-steroidal anti-inflammatories (NSAID): Secondary | ICD-10-CM | POA: Diagnosis not present

## 2017-11-29 DIAGNOSIS — I129 Hypertensive chronic kidney disease with stage 1 through stage 4 chronic kidney disease, or unspecified chronic kidney disease: Secondary | ICD-10-CM | POA: Diagnosis not present

## 2017-11-29 DIAGNOSIS — E1165 Type 2 diabetes mellitus with hyperglycemia: Secondary | ICD-10-CM | POA: Diagnosis not present

## 2017-11-29 DIAGNOSIS — E559 Vitamin D deficiency, unspecified: Secondary | ICD-10-CM | POA: Diagnosis not present

## 2017-11-29 DIAGNOSIS — R809 Proteinuria, unspecified: Secondary | ICD-10-CM | POA: Diagnosis not present

## 2017-11-29 DIAGNOSIS — Z794 Long term (current) use of insulin: Secondary | ICD-10-CM | POA: Diagnosis not present

## 2017-11-29 DIAGNOSIS — E1122 Type 2 diabetes mellitus with diabetic chronic kidney disease: Secondary | ICD-10-CM | POA: Diagnosis not present

## 2017-11-29 DIAGNOSIS — N183 Chronic kidney disease, stage 3 (moderate): Secondary | ICD-10-CM | POA: Diagnosis not present

## 2017-11-29 DIAGNOSIS — N179 Acute kidney failure, unspecified: Secondary | ICD-10-CM | POA: Diagnosis not present

## 2017-11-29 DIAGNOSIS — E1129 Type 2 diabetes mellitus with other diabetic kidney complication: Secondary | ICD-10-CM | POA: Diagnosis not present

## 2017-11-30 MED ORDER — CELECOXIB 100 MG PO CAPS: 100.0000 mg | ORAL_CAPSULE | Freq: Every day | ORAL | 3 refills | Status: DC

## 2017-11-30 NOTE — Telephone Encounter (Signed)
Called the patient to confirm he was previously on celebrex 100 mg daily by previous MD. Would like this refilled to [place].

## 2017-11-30 NOTE — Telephone Encounter (Signed)
Patient called back, advised rx was called in for Celebrex and to dc ibuprofien-famotidine.

## 2017-11-30 NOTE — Telephone Encounter (Signed)
Sent in celebrex/called the patient left message to call back.

## 2017-11-30 NOTE — Telephone Encounter (Signed)
OK to call in. I will stop the ibuprofen-famotidine combo for now. TY.

## 2017-12-09 DIAGNOSIS — R69 Illness, unspecified: Secondary | ICD-10-CM | POA: Diagnosis not present

## 2017-12-20 DIAGNOSIS — I10 Essential (primary) hypertension: Secondary | ICD-10-CM | POA: Diagnosis not present

## 2017-12-20 DIAGNOSIS — H02402 Unspecified ptosis of left eyelid: Secondary | ICD-10-CM | POA: Diagnosis not present

## 2017-12-20 DIAGNOSIS — H2513 Age-related nuclear cataract, bilateral: Secondary | ICD-10-CM | POA: Diagnosis not present

## 2017-12-20 DIAGNOSIS — Z7984 Long term (current) use of oral hypoglycemic drugs: Secondary | ICD-10-CM | POA: Diagnosis not present

## 2017-12-20 DIAGNOSIS — H2510 Age-related nuclear cataract, unspecified eye: Secondary | ICD-10-CM | POA: Diagnosis not present

## 2017-12-20 DIAGNOSIS — E119 Type 2 diabetes mellitus without complications: Secondary | ICD-10-CM | POA: Diagnosis not present

## 2017-12-20 DIAGNOSIS — H527 Unspecified disorder of refraction: Secondary | ICD-10-CM | POA: Diagnosis not present

## 2018-01-03 NOTE — Progress Notes (Signed)
Subjective:   Tommy Green is a 72 y.o. male who presents for an Initial Medicare Annual Wellness Visit. The Patient was informed that the wellness visit is to identify future health risk and educate and initiate measures that can reduce risk for increased disease through the lifespan.    Review of Systems No ROS.  Medicare Wellness Visit. Additional risk factors are reflected in the social history.  Cardiac Risk Factors include: advanced age (>75men, >76 women);obesity (BMI >30kg/m2);diabetes mellitus;hypertension;male gender Sleep patterns: Sleeps 7-9 hrs per night..  Home Safety/Smoke Alarms: Feels safe in home. Smoke alarms in place.  Living environment; residence and Firearm Safety: no stairs. Lives with wife.  Seat Belt Safety/Bike Helmet: Wears seat belt.   Male:   CCS- pt reports last 4 weeks ago at [facility] GI-1 polyp.    PSA- No results found for: PSA  Eye- Dr.Freeman yearly.      Objective:    Today's Vitals   01/05/18 1513  BP: 140/80  Pulse: 68  SpO2: 96%  Weight: 254 lb 9.6 oz (115.5 kg)  Height: 5\' 11"  (1.803 m)  PainSc: 5    Body mass index is 35.51 kg/m.  Advanced Directives 01/05/2018  Does Patient Have a Medical Advance Directive? Yes  Type of Paramedic of [place];Living will  Copy of [place] in Chart? No - copy requested    Current Medications (verified) Outpatient Encounter Medications as of 01/05/2018  Medication Sig  . ALPHA LIPOIC ACID PO Take 400 mg 2 (two) times daily by mouth.  . carvedilol (COREG) 12.5 MG tablet Take 1 tablet (12.5 mg total) by mouth 2 (two) times daily with a meal.  . celecoxib (CELEBREX) 100 MG capsule Take 1 capsule (100 mg total) by mouth daily.  . DULoxetine (CYMBALTA) 30 MG capsule Take 30 mg daily by mouth.  . fenofibrate micronized (LOFIBRA) 200 MG capsule Take 1 capsule (200 mg total) by mouth daily before breakfast.  . gabapentin (NEURONTIN) 600 MG tablet Take  1 tablet (600 mg total) by mouth 3 (three) times daily.  . hydrochlorothiazide (HYDRODIURIL) 25 MG tablet Take 25 mg by mouth daily.  . Insulin Aspart (NOVOLOG [place]) Sliding scale  . Insulin Detemir (LEVEMIR FLEXPEN) 100 UNIT/ML Pen Inject 60 Units daily at 10 pm into the skin.  Marland Kitchen lisinopril (PRINIVIL,ZESTRIL) 40 MG tablet Take 40 mg by mouth daily.  . metFORMIN (GLUCOPHAGE) 500 MG tablet Take 500 mg by mouth 2 (two) times daily with a meal.  . Multiple Vitamins-Minerals (MULTIVITAMIN WITH MINERALS) tablet Take 1 tablet by mouth daily.  . vitamin B-12 (CYANOCOBALAMIN) 1000 MCG tablet Take 1,000 mcg by mouth daily.   No facility-administered encounter medications on file as of 01/05/2018.     Allergies (verified) Influenza vaccines; Aspirin; Atorvastatin; Canagliflozin; and Statins   History: Past Medical History:  Diagnosis Date  . Anemia   . Essential hypertension 07/18/2017  . History of rheumatic fever   . History of stroke 07/18/2017  . Hypertriglyceridemia 07/18/2017  . Type 2 diabetes mellitus with diabetic neuropathy, unspecified ([place]) 07/18/2017   Past Surgical History:  Procedure Laterality Date  . FOOT CAPSULOTOMY Left 07/25/2008   Mid Foot #2 MPJ  . Hammertoe Repair Left 07/25/2008   #2 toe  . SPINAL FUSION    . TARSAL TUNNEL RELEASE Left 07/25/2008   Family History  Problem Relation Age of Onset  . Hyperlipidemia Mother   . Hyperlipidemia Father   . Cancer Neg Hx  Social History   Socioeconomic History  . Marital status: Married    Spouse name: Not on file  . Number of children: Not on file  . Years of education: Not on file  . Highest education level: Not on file  Occupational History  . Not on file  Social Needs  . Financial resource strain: Not on file  . Food insecurity:    Worry: Not on file    Inability: Not on file  . Transportation needs:    Medical: Not on file    Non-medical: Not on file  Tobacco Use  . Smoking status: Former Smoker    Types:  Cigarettes  . Smokeless tobacco: Never Used  . Tobacco comment: Quit in 1973  Substance and Sexual Activity  . Alcohol use: No  . Drug use: No  . Sexual activity: Never  Lifestyle  . Physical activity:    Days per week: Not on file    Minutes per session: Not on file  . Stress: Not on file  Relationships  . Social connections:    Talks on phone: Not on file    Gets together: Not on file    Attends religious service: Not on file    Active member of club or organization: Not on file    Attends meetings of clubs or organizations: Not on file    Relationship status: Not on file  Other Topics Concern  . Not on file  Social History Narrative  . Not on file   Tobacco Counseling Counseling given: Not Answered Comment: Quit in 1973   Clinical Intake:     Pain : 0-10 Pain Score: 5  Pain Location: Hip Pain Orientation: Right, Left Pain Radiating Towards: feet Pain Onset: More than a month ago Pain Frequency: Constant Pain Relieving Factors: medication and moving around  Pain Relieving Factors: medication and moving around              Activities of Daily Living In your present state of health, do you have any difficulty performing the following activities: 01/05/2018  Hearing? N  Vision? N  Difficulty concentrating or making decisions? N  Walking or climbing stairs? N  Dressing or bathing? N  Doing errands, shopping? N  Preparing Food and eating ? N  Using the Toilet? N  In the past six months, have you accidently leaked urine? N  Do you have problems with loss of bowel control? N  Managing your Medications? N  Managing your Finances? N  Housekeeping or managing your Housekeeping? N  Some recent data might be hidden     Immunizations and Health Maintenance  There is no immunization history on file for this patient. Health Maintenance Due  Topic Date Due  . HEMOGLOBIN A1C  [birth year]  . Hepatitis C Screening  [birth year]  . FOOT EXAM  01/16/1954  .  OPHTHALMOLOGY EXAM  01/16/1954  . TETANUS/TDAP  01/17/1963  . COLONOSCOPY  01/16/1994  . INFLUENZA VACCINE  05/18/2017    Patient Care Team: [name], DO as PCP - General (Family Medicine) Alanson Aly, MD as Referring Physician (Internal Medicine) Jimmie Molly, OD (Optometry)  Indicate any recent Medical Services you may have received from other than [facility] providers in the past year (date may be approximate).    Assessment:   This is a routine wellness examination for [place]. Physical assessment deferred to PCP.  Hearing/Vision screen Hearing Screening Comments: Able to hear conversational tones w/o difficulty. No issues reported.  Passed whisper  test.  Vision Screening Comments: Dr.Freeman yearly. Last exam last week.  Dietary issues and exercise activities discussed: Current Exercise Habits: The patient does not participate in regular exercise at present, Exercise limited by: neurologic condition(s) Diet (meal preparation, eat out, water intake, caffeinated beverages, dairy products, fruits and vegetables): 24 hour recall. Breakfast: egg,grits,2 toasts, water, coffee. Lunch: soup and sandwich. Water. Dinner: [employer]     Goals    . maintain normal A1C      Depression Screen PHQ 2/9 Scores 01/05/2018  PHQ - 2 Score 0    Fall Risk Fall Risk  01/05/2018  Falls in the past year? No     Cognitive Function: MMSE - Mini Mental State Exam 01/05/2018  Orientation to time 5  Orientation to Place 5  Registration 3  Attention/ Calculation 2  Recall 2  Language- name 2 objects 2  Language- repeat 1  Language- follow 3 step command 3  Language- read & follow direction 1  Write a sentence 1  Copy design 1  Total score 26        Screening Tests Health Maintenance  Topic Date Due  . HEMOGLOBIN A1C  03/[birth year]  . Hepatitis C Screening  04-12-1946  . FOOT EXAM  01/16/1954  . OPHTHALMOLOGY EXAM  01/16/1954  . TETANUS/TDAP  01/17/1963  .  COLONOSCOPY  01/16/1994  . INFLUENZA VACCINE  05/18/2017  . PNA vac Low Risk Adult  Completed      Plan:   Follow up with Dr.Wendling as scheduled 03/01/18.  Continue to eat heart healthy diet (full of fruits, vegetables, whole grains, lean protein, water--limit salt, fat, and sugar intake) and increase physical activity as tolerated.  Continue doing brain stimulating activities (puzzles, reading, adult coloring books, staying active) to keep memory sharp.   I have personally reviewed and noted the following in the patient's chart:   . Medical and social history . Use of alcohol, tobacco or illicit drugs  . Current medications and supplements . Functional ability and status . Nutritional status . Physical activity . Advanced directives . List of other physicians . Hospitalizations, surgeries, and ER visits in previous 12 months . Vitals . Screenings to include cognitive, depression, and falls . Referrals and appointments  In addition, I have reviewed and discussed with patient certain preventive protocols, quality metrics, and best practice recommendations. A written personalized care plan for preventive services as well as general preventive health recommendations were provided to patient.     Shela Nevin, [place]   01/05/2018

## 2018-01-05 ENCOUNTER — Encounter: Payer: Self-pay | Admitting: *Deleted

## 2018-01-05 ENCOUNTER — Ambulatory Visit (INDEPENDENT_AMBULATORY_CARE_PROVIDER_SITE_OTHER): Payer: Medicare HMO | Admitting: *Deleted

## 2018-01-05 VITALS — BP 140/80 | HR 68 | Ht 71.0 in | Wt 254.6 lb

## 2018-01-05 DIAGNOSIS — Z0000 Encounter for general adult medical examination without abnormal findings: Secondary | ICD-10-CM | POA: Diagnosis not present

## 2018-01-05 NOTE — Progress Notes (Signed)
Noted. Agree with above.  [place], DO 01/05/18 4:33 PM

## 2018-01-05 NOTE — Patient Instructions (Signed)
Follow up with Dr.Wendling as scheduled 03/01/18.  Continue to eat heart healthy diet (full of fruits, vegetables, whole grains, lean protein, water--limit salt, fat, and sugar intake) and increase physical activity as tolerated.  Continue doing brain stimulating activities (puzzles, reading, adult coloring books, staying active) to keep memory sharp.    Tommy Green , Thank you for taking time to come for your Medicare Wellness Visit. I appreciate your ongoing commitment to your health goals. Please review the following plan we discussed and let me know if I can assist you in the future.   These are the goals we discussed: Goals    . maintain normal A1C       This is a list of the screening recommended for you and due dates:  Health Maintenance  Topic Date Due  . Hemoglobin A1C  Apr 17, 1946  .  Hepatitis C: One time screening is recommended by Center for Disease Control  (CDC) for  adults born from [birth year] through 1967.   Jul 03, 1946  . Complete foot exam   01/16/1954  . Eye exam for diabetics  01/16/1954  . Tetanus Vaccine  01/17/1963  . Colon Cancer Screening  01/16/1994  . Flu Shot  05/18/2017  . Pneumonia vaccines  Completed    Health Maintenance, Male A healthy lifestyle and preventive care is important for your health and wellness. Ask your health care provider about what schedule of regular examinations is right for you. What should I know about weight and diet? Eat a Healthy Diet  Eat plenty of vegetables, fruits, whole grains, low-fat dairy products, and lean protein.  Do not eat a lot of foods high in solid fats, added sugars, or salt.  Maintain a Healthy Weight Regular exercise can help you achieve or maintain a healthy weight. You should:  Do at least 150 minutes of exercise each week. The exercise should increase your heart rate and make you sweat (moderate-intensity exercise).  Do strength-training exercises at least twice a week.  Watch Your Levels of Cholesterol  and Blood Lipids  Have your blood tested for lipids and cholesterol every 5 years starting at 74 years of age. If you are at high risk for heart disease, you should start having your blood tested when you are 74 years old. You may need to have your cholesterol levels checked more often if: ? Your lipid or cholesterol levels are high. ? You are older than 74 years of age. ? You are at high risk for heart disease.  What should I know about cancer screening? Many types of cancers can be detected early and may often be prevented. Lung Cancer  You should be screened every year for lung cancer if: ? You are a current smoker who has smoked for at least 30 years. ? You are a former smoker who has quit within the past 15 years.  Talk to your health care provider about your screening options, when you should start screening, and how often you should be screened.  Colorectal Cancer  Routine colorectal cancer screening usually begins at 74 years of age and should be repeated every 5-10 years until you are 74 years old. You may need to be screened more often if early forms of precancerous polyps or small growths are found. Your health care provider may recommend screening at an earlier age if you have risk factors for colon cancer.  Your health care provider may recommend using home test kits to check for hidden blood in the stool.  A  small camera at the end of a tube can be used to examine your colon (sigmoidoscopy or colonoscopy). This checks for the earliest forms of colorectal cancer.  Prostate and Testicular Cancer  Depending on your age and overall health, your health care provider may do certain tests to screen for prostate and testicular cancer.  Talk to your health care provider about any symptoms or concerns you have about testicular or prostate cancer.  Skin Cancer  Check your skin from head to toe regularly.  Tell your health care provider about any new moles or changes in moles,  especially if: ? There is a change in a mole's size, shape, or color. ? You have a mole that is larger than a pencil eraser.  Always use sunscreen. Apply sunscreen liberally and repeat throughout the day.  Protect yourself by wearing long sleeves, pants, a wide-brimmed hat, and sunglasses when outside.  What should I know about heart disease, diabetes, and high blood pressure?  If you are 8-56 years of age, have your blood pressure checked every 3-5 years. If you are 41 years of age or older, have your blood pressure checked every year. You should have your blood pressure measured twice-once when you are at a hospital or clinic, and once when you are not at a hospital or clinic. Record the average of the two measurements. To check your blood pressure when you are not at a hospital or clinic, you can use: ? An automated blood pressure machine at a pharmacy. ? A home blood pressure monitor.  Talk to your health care provider about your target blood pressure.  If you are between 48-35 years old, ask your health care provider if you should take aspirin to prevent heart disease.  Have regular diabetes screenings by checking your fasting blood sugar level. ? If you are at a normal weight and have a low risk for diabetes, have this test once every three years after the age of 60. ? If you are overweight and have a high risk for diabetes, consider being tested at a younger age or more often.  A one-time screening for abdominal aortic aneurysm (AAA) by ultrasound is recommended for men aged 9-75 years who are current or former smokers. What should I know about preventing infection? Hepatitis B If you have a higher risk for hepatitis B, you should be screened for this virus. Talk with your health care provider to find out if you are at risk for hepatitis B infection. Hepatitis C Blood testing is recommended for:  Everyone born from [birth year] through 1965.  Anyone with known risk factors for  hepatitis C.  Sexually Transmitted Diseases (STDs)  You should be screened each year for STDs including gonorrhea and chlamydia if: ? You are sexually active and are younger than 74 years of age. ? You are older than 74 years of age and your health care provider tells you that you are at risk for this type of infection. ? Your sexual activity has changed since you were last screened and you are at an increased risk for chlamydia or gonorrhea. Ask your health care provider if you are at risk.  Talk with your health care provider about whether you are at high risk of being infected with HIV. Your health care provider may recommend a prescription medicine to help prevent HIV infection.  What else can I do?  Schedule regular health, dental, and eye exams.  Stay current with your vaccines (immunizations).  Do not use any  tobacco products, such as cigarettes, chewing tobacco, and e-cigarettes. If you need help quitting, ask your health care provider.  Limit alcohol intake to no more than 2 drinks per day. One drink equals 12 ounces of beer, 5 ounces of wine, or 1 ounces of hard liquor.  Do not use street drugs.  Do not share needles.  Ask your health care provider for help if you need support or information about quitting drugs.  Tell your health care provider if you often feel depressed.  Tell your health care provider if you have ever been abused or do not feel safe at home. This information is not intended to replace advice given to you by your health care provider. Make sure you discuss any questions you have with your health care provider. Document Released: 04/01/2008 Document Revised: 06/02/2016 Document Reviewed: 07/08/2015 Elsevier Interactive Patient Education  [employer].

## 2018-01-06 DIAGNOSIS — M5136 Other intervertebral disc degeneration, lumbar region: Secondary | ICD-10-CM | POA: Diagnosis not present

## 2018-01-06 DIAGNOSIS — N183 Chronic kidney disease, stage 3 (moderate): Secondary | ICD-10-CM | POA: Diagnosis not present

## 2018-01-06 DIAGNOSIS — M160 Bilateral primary osteoarthritis of hip: Secondary | ICD-10-CM | POA: Diagnosis not present

## 2018-01-26 DIAGNOSIS — Z794 Long term (current) use of insulin: Secondary | ICD-10-CM | POA: Diagnosis not present

## 2018-01-26 DIAGNOSIS — N183 Chronic kidney disease, stage 3 (moderate): Secondary | ICD-10-CM | POA: Diagnosis not present

## 2018-01-26 DIAGNOSIS — Z87891 Personal history of nicotine dependence: Secondary | ICD-10-CM | POA: Diagnosis not present

## 2018-01-26 DIAGNOSIS — E1142 Type 2 diabetes mellitus with diabetic polyneuropathy: Secondary | ICD-10-CM | POA: Diagnosis not present

## 2018-01-26 DIAGNOSIS — Z7289 Other problems related to lifestyle: Secondary | ICD-10-CM | POA: Diagnosis not present

## 2018-01-26 DIAGNOSIS — E1122 Type 2 diabetes mellitus with diabetic chronic kidney disease: Secondary | ICD-10-CM | POA: Diagnosis not present

## 2018-01-26 DIAGNOSIS — Z6836 Body mass index (BMI) 36.0-36.9, adult: Secondary | ICD-10-CM | POA: Diagnosis not present

## 2018-01-26 DIAGNOSIS — H02402 Unspecified ptosis of left eyelid: Secondary | ICD-10-CM | POA: Diagnosis not present

## 2018-01-26 DIAGNOSIS — I129 Hypertensive chronic kidney disease with stage 1 through stage 4 chronic kidney disease, or unspecified chronic kidney disease: Secondary | ICD-10-CM | POA: Diagnosis not present

## 2018-02-07 DIAGNOSIS — R69 Illness, unspecified: Secondary | ICD-10-CM | POA: Diagnosis not present

## 2018-02-13 DIAGNOSIS — M5136 Other intervertebral disc degeneration, lumbar region: Secondary | ICD-10-CM | POA: Diagnosis not present

## 2018-02-13 DIAGNOSIS — M160 Bilateral primary osteoarthritis of hip: Secondary | ICD-10-CM | POA: Diagnosis not present

## 2018-03-01 ENCOUNTER — Ambulatory Visit (INDEPENDENT_AMBULATORY_CARE_PROVIDER_SITE_OTHER): Payer: Medicare HMO | Admitting: Family Medicine

## 2018-03-01 ENCOUNTER — Encounter: Payer: Self-pay | Admitting: Family Medicine

## 2018-03-01 VITALS — BP 118/68 | HR 64 | Temp 97.5°F | Ht 71.0 in | Wt 262.0 lb

## 2018-03-01 DIAGNOSIS — E781 Pure hyperglyceridemia: Secondary | ICD-10-CM | POA: Diagnosis not present

## 2018-03-01 DIAGNOSIS — L989 Disorder of the skin and subcutaneous tissue, unspecified: Secondary | ICD-10-CM

## 2018-03-01 DIAGNOSIS — I10 Essential (primary) hypertension: Secondary | ICD-10-CM

## 2018-03-01 MED ORDER — HYDROCHLOROTHIAZIDE 25 MG PO TABS: 25.0000 mg | ORAL_TABLET | Freq: Every day | ORAL | 3 refills | Status: DC

## 2018-03-01 NOTE — Progress Notes (Signed)
Pre visit review using our clinic review tool, if applicable. No additional management support is needed unless otherwise documented below in the visit note. 

## 2018-03-01 NOTE — Progress Notes (Signed)
Chief Complaint  Patient presents with  . Hypertension  . Hip Pain    Subjective Tommy Green is a 72 y.o. male who presents for hypertension follow up. He does monitor home blood pressures. Blood pressures ranging from 120's/60-70's on average. He is compliant with medications. Patient has these side effects of medication: none He is adhering to a healthy diet overall. Current exercise: walking, doing yardwork  Hyperlipidemia Patient presents for hypertriglyceridemia follow up. Currently being treated with Lofibra 200 mg/d and compliance with treatment thus far has been good. He is adhering to a healthy. The patient is not known to have coexisting coronary artery disease.  He has noticed a skin lesion on his chest for past 5 mo that will sting. He has a dermatologist, but does not wish to return to them. +hx of [place].    Past Medical History:  Diagnosis Date  . Anemia   . Essential hypertension 07/18/2017  . History of rheumatic fever   . History of stroke 07/18/2017  . Hypertriglyceridemia 07/18/2017  . Type 2 diabetes mellitus with diabetic neuropathy, unspecified ([place]) 07/18/2017    Review of Systems Cardiovascular: no chest pain Respiratory:  no shortness of breath  Exam BP 118/68 (BP Location: Left Arm, Patient Position: Sitting, Cuff Size: Large)   Pulse 64   Temp (!) 97.5 F (36.4 C) (Oral)   Ht 5\' 11"  (1.803 m)   Wt 262 lb (118.8 kg)   SpO2 97%   BMI 36.54 kg/m  General:  well developed, well nourished, in no apparent distress Heart: RRR, no bruits, no LE edema Lungs: clear to auscultation, no accessory muscle use Skin: on anterior chest on R, there is a scaly and erythematous patch without fluctuance, excessive warmth, or drainage Psych: well oriented with normal range of affect and appropriate judgment/insight  Essential hypertension - Plan: Comprehensive metabolic panel  Hypertriglyceridemia - Plan: Lipid panel  Skin lesion  Orders as above. Cont  current meds. Counseled on diet and exercise. F/u in 2 weeks for shave bx. He wishes to have it removed. The patient voiced understanding and agreement to the plan.  [place], DO 03/01/18  12:05 PM

## 2018-03-01 NOTE — Patient Instructions (Signed)
Keep up the good work.   Come to your next lab visit fasting.  Let us know if you need anything.

## 2018-03-03 ENCOUNTER — Other Ambulatory Visit (INDEPENDENT_AMBULATORY_CARE_PROVIDER_SITE_OTHER): Payer: Medicare HMO

## 2018-03-03 DIAGNOSIS — E781 Pure hyperglyceridemia: Secondary | ICD-10-CM

## 2018-03-03 DIAGNOSIS — I10 Essential (primary) hypertension: Secondary | ICD-10-CM | POA: Diagnosis not present

## 2018-03-03 LAB — COMPREHENSIVE METABOLIC PANEL
ALBUMIN: 4.1 g/dL (ref 3.5–5.2)
ALT: 17 U/L (ref 0–53)
AST: 13 U/L (ref 0–37)
Alkaline Phosphatase: 38 U/L — ABNORMAL LOW (ref 39–117)
BILIRUBIN TOTAL: 0.3 mg/dL (ref 0.2–1.2)
BUN: 28 mg/dL — AB (ref 6–23)
CALCIUM: 9.4 mg/dL (ref 8.4–10.5)
CHLORIDE: 106 meq/L (ref 96–112)
CO2: 27 meq/L (ref 19–32)
CREATININE: 1.21 mg/dL (ref 0.40–1.50)
GFR: 62.28 mL/min (ref >60.00)
Glucose, Bld: 124 mg/dL — ABNORMAL HIGH (ref 70–99)
Potassium: 5.4 mEq/L — ABNORMAL HIGH (ref 3.5–5.1)
SODIUM: 140 meq/L (ref 135–145)
Total Protein: 6.5 g/dL (ref 6.0–8.3)

## 2018-03-03 LAB — LIPID PANEL
CHOLESTEROL: 152 mg/dL (ref 0–200)
HDL: 26.1 mg/dL — ABNORMAL LOW (ref >39.00)
NonHDL: 126.18
Total CHOL/HDL Ratio: 6
Triglycerides: 360 mg/dL — ABNORMAL HIGH (ref 0.0–149.0)
VLDL: 72 mg/dL — ABNORMAL HIGH (ref 0.0–40.0)

## 2018-03-03 LAB — LDL CHOLESTEROL, DIRECT: LDL DIRECT: 74 mg/dL

## 2018-03-06 ENCOUNTER — Other Ambulatory Visit: Payer: Self-pay | Admitting: Family Medicine

## 2018-03-06 DIAGNOSIS — E875 Hyperkalemia: Secondary | ICD-10-CM

## 2018-03-07 ENCOUNTER — Other Ambulatory Visit: Payer: Self-pay | Admitting: Family Medicine

## 2018-03-07 ENCOUNTER — Other Ambulatory Visit (INDEPENDENT_AMBULATORY_CARE_PROVIDER_SITE_OTHER): Payer: Medicare HMO

## 2018-03-07 DIAGNOSIS — E875 Hyperkalemia: Secondary | ICD-10-CM

## 2018-03-07 LAB — BASIC METABOLIC PANEL
BUN: 42 mg/dL — ABNORMAL HIGH (ref 6–23)
CALCIUM: 9.2 mg/dL (ref 8.4–10.5)
CHLORIDE: 104 meq/L (ref 96–112)
CO2: 26 mEq/L (ref 19–32)
Creatinine, Ser: 1.37 mg/dL (ref 0.40–1.50)
GFR: 53.97 mL/min — ABNORMAL LOW (ref >60.00)
GLUCOSE: 219 mg/dL — AB (ref 70–99)
Potassium: 5.1 mEq/L (ref 3.5–5.1)
SODIUM: 141 meq/L (ref 135–145)

## 2018-03-16 DIAGNOSIS — G8929 Other chronic pain: Secondary | ICD-10-CM | POA: Diagnosis not present

## 2018-03-16 DIAGNOSIS — Z794 Long term (current) use of insulin: Secondary | ICD-10-CM | POA: Diagnosis not present

## 2018-03-16 DIAGNOSIS — I10 Essential (primary) hypertension: Secondary | ICD-10-CM | POA: Diagnosis not present

## 2018-03-16 DIAGNOSIS — I69898 Other sequelae of other cerebrovascular disease: Secondary | ICD-10-CM | POA: Diagnosis not present

## 2018-03-16 DIAGNOSIS — H02402 Unspecified ptosis of left eyelid: Secondary | ICD-10-CM | POA: Diagnosis not present

## 2018-03-16 DIAGNOSIS — E1142 Type 2 diabetes mellitus with diabetic polyneuropathy: Secondary | ICD-10-CM | POA: Diagnosis not present

## 2018-03-16 DIAGNOSIS — E785 Hyperlipidemia, unspecified: Secondary | ICD-10-CM | POA: Diagnosis not present

## 2018-03-16 DIAGNOSIS — E1165 Type 2 diabetes mellitus with hyperglycemia: Secondary | ICD-10-CM | POA: Diagnosis not present

## 2018-03-16 DIAGNOSIS — K219 Gastro-esophageal reflux disease without esophagitis: Secondary | ICD-10-CM | POA: Diagnosis not present

## 2018-03-17 ENCOUNTER — Encounter: Payer: Self-pay | Admitting: Family Medicine

## 2018-03-17 ENCOUNTER — Ambulatory Visit (INDEPENDENT_AMBULATORY_CARE_PROVIDER_SITE_OTHER): Payer: Medicare HMO | Admitting: Family Medicine

## 2018-03-17 DIAGNOSIS — L989 Disorder of the skin and subcutaneous tissue, unspecified: Secondary | ICD-10-CM | POA: Diagnosis not present

## 2018-03-17 DIAGNOSIS — D045 Carcinoma in situ of skin of trunk: Secondary | ICD-10-CM | POA: Diagnosis not present

## 2018-03-17 NOTE — Progress Notes (Signed)
Chief Complaint  Patient presents with  . Procedure   Patient is here for removal of a lesion on his chest.  Skin- see below; slightly raised, slightly erythematous and scaly lesion measuring 0.7 cm x 0.9 cm in dimension Psych- age appropriate judgment and insight      Chest   Procedure note; shave biopsy Informed consent was obtained. The area was cleaned with alcohol and injected with 1 mL of 1% lidocaine with epinephrine. A Dermablade was slightly bent and used to cut under the area of interest. The specimen was placed in a sterile specimen cup and sent to the lab. The area was then cauterized ensuring adequate hemostasis. The area was dressed with triple antibiotic ointment and a bandage. There were no complications noted. The patient tolerated the procedure well.  Skin lesion - Plan: Dermatology pathology, Dermatology pathology, PR SHAV SKIN LES 0.6-1.0 CM TRUNK,ARM,LEG  Orders as above. Aftercare instructions verbalized and written down. Follow-up as originally scheduled. The patient voiced understanding and agreement to the plan.  Crosby Oyster Wendling 4:16 PM 03/17/18

## 2018-03-17 NOTE — Patient Instructions (Signed)
Do not shower for the rest of the day. When you do wash it, use only soap and water. Do not vigorously scrub. Apply triple antibiotic ointment (like Neosporin) twice daily. Keep the area clean and dry.   Things to look out for: increasing pain not relieved by ibuprofen/acetaminophen, fevers, spreading redness, drainage of pus, or foul odor.  Give us 1 business week to get the results of your biopsy back.  Let us know if you need anything.  

## 2018-03-20 DIAGNOSIS — R69 Illness, unspecified: Secondary | ICD-10-CM | POA: Diagnosis not present

## 2018-03-24 ENCOUNTER — Telehealth: Payer: Self-pay | Admitting: *Deleted

## 2018-03-24 ENCOUNTER — Encounter: Payer: Self-pay | Admitting: Family Medicine

## 2018-03-24 ENCOUNTER — Ambulatory Visit (INDEPENDENT_AMBULATORY_CARE_PROVIDER_SITE_OTHER): Payer: Medicare HMO | Admitting: Family Medicine

## 2018-03-24 DIAGNOSIS — D049 Carcinoma in situ of skin, unspecified: Secondary | ICD-10-CM | POA: Insufficient documentation

## 2018-03-24 DIAGNOSIS — D045 Carcinoma in situ of skin of trunk: Secondary | ICD-10-CM

## 2018-03-24 NOTE — Progress Notes (Addendum)
Chief Complaint  Patient presents with  . Procedure   Tommy Green is here for follow-up of his skin biopsy that showed squamous cell carcinoma in situ.  He does not have any characteristics that would place him in a high risk factor.  We have thus decided to excise the lesion.  Skin lesion measured 0.7 cm x 0.9 cm  Procedure note; excision Informed consent was obtained. The area on R anterior chest wall was cleaned with alcohol and injected with 2 mL of 1% lidocaine with epinephrine. An elliptical track around the area of interest was demarcated, 0.5 cm margins.  Sterile gloves used. The area was then cleaned with Betadine. A 15 blade scalpel was then used to follow the elliptical track as demarcated. Forceps were used to create traction to enhance dissection of the tissue planes and removal of the specimen. The specimen was placed a sterile specimen cup and sent to the lab. The area was cauterized with a Hyfrecator ensuring adequate hemostasis. 5 Simple 4-0 nylon sutures were placed with good approximation of wound edges. The area was dressed with triple antibiotic ointment and a bandage. There were no complications noted. The patient tolerated the procedure well.  2 smallest margins were 0.5 mm, lesion largest diameter was 9 mm (1.9 cm total)  Squamous cell carcinoma in situ (SCCIS) of skin - Plan: Dermatology pathology, PR EXC SKIN MALIG 1.1-2 CM REMAINDR BODY  Aftercare instructions verbalized and written down. I will see him in 1 week for suture removal. The patient voiced understanding and agreement to the plan.  Crosby Oyster Blayn Whetsell 4:49 PM 03/24/18

## 2018-03-24 NOTE — Progress Notes (Signed)
Pre visit review using our clinic review tool, if applicable. No additional management support is needed unless otherwise documented below in the visit note. 

## 2018-03-24 NOTE — Telephone Encounter (Signed)
Received Dermatopathology Report results from [facility]; forwarded to provider/SLS 06/07

## 2018-03-24 NOTE — Patient Instructions (Signed)
Do not shower for the rest of the day. When you do wash it, use only soap and water. Do not vigorously scrub. Apply triple antibiotic ointment (like Neosporin) twice daily. Keep the area clean and dry.   Things to look out for: increasing pain not relieved by ibuprofen/acetaminophen, fevers, spreading redness, drainage of pus, or foul odor. (pretty much same as last time)  Let us know if you need anything.

## 2018-03-31 ENCOUNTER — Encounter: Payer: Self-pay | Admitting: Family Medicine

## 2018-03-31 ENCOUNTER — Ambulatory Visit (INDEPENDENT_AMBULATORY_CARE_PROVIDER_SITE_OTHER): Payer: Medicare HMO | Admitting: Family Medicine

## 2018-03-31 DIAGNOSIS — D049 Carcinoma in situ of skin, unspecified: Secondary | ICD-10-CM

## 2018-03-31 MED ORDER — FENOFIBRATE MICRONIZED 200 MG PO CAPS: 200.0000 mg | ORAL_CAPSULE | Freq: Every day | ORAL | 3 refills | Status: DC

## 2018-03-31 MED ORDER — LISINOPRIL 40 MG PO TABS: 40.0000 mg | ORAL_TABLET | Freq: Every day | ORAL | 3 refills | Status: DC

## 2018-03-31 NOTE — Progress Notes (Signed)
Pt here for suture removal. No concerns. All of cancer was removed per path.   5 simple sutures removed without issues. Bandaid placed as mild bleeding from torn scabs.  All questions answered.  Tommy Green 9:24 AM 03/31/18

## 2018-04-07 ENCOUNTER — Other Ambulatory Visit (INDEPENDENT_AMBULATORY_CARE_PROVIDER_SITE_OTHER): Payer: Medicare HMO

## 2018-04-07 DIAGNOSIS — E875 Hyperkalemia: Secondary | ICD-10-CM | POA: Diagnosis not present

## 2018-04-07 LAB — BASIC METABOLIC PANEL
BUN: 33 mg/dL — ABNORMAL HIGH (ref 6–23)
CO2: 25 mEq/L (ref 19–32)
CREATININE: 1.25 mg/dL (ref 0.40–1.50)
Calcium: 9.1 mg/dL (ref 8.4–10.5)
Chloride: 105 mEq/L (ref 96–112)
GFR: 59.97 mL/min — ABNORMAL LOW (ref >60.00)
Glucose, Bld: 169 mg/dL — ABNORMAL HIGH (ref 70–99)
POTASSIUM: 4.8 meq/L (ref 3.5–5.1)
SODIUM: 140 meq/L (ref 135–145)

## 2018-04-10 ENCOUNTER — Ambulatory Visit (INDEPENDENT_AMBULATORY_CARE_PROVIDER_SITE_OTHER): Payer: Medicare HMO | Admitting: Family Medicine

## 2018-04-10 ENCOUNTER — Encounter: Payer: Self-pay | Admitting: Family Medicine

## 2018-04-10 VITALS — BP 128/80 | HR 77 | Temp 98.0°F | Ht 71.0 in | Wt 264.0 lb

## 2018-04-10 DIAGNOSIS — S99922A Unspecified injury of left foot, initial encounter: Secondary | ICD-10-CM | POA: Diagnosis not present

## 2018-04-10 DIAGNOSIS — S21101A Unspecified open wound of right front wall of thorax without penetration into thoracic cavity, initial encounter: Secondary | ICD-10-CM

## 2018-04-10 MED ORDER — SILVER SULFADIAZINE 1 % EX CREA: 1.0000 "application " | TOPICAL_CREAM | Freq: Every day | CUTANEOUS | 0 refills | Status: DC

## 2018-04-10 NOTE — Progress Notes (Signed)
Chief Complaint  Patient presents with  . Follow-up    recheck stitch removal site    Tommy Green is a 72 y.o. male here for a skin complaint.  Duration: 5 days Location: R chest Pruritic? No Painful? No Drainage? Yes - clear Had excisional biopsy that split open Other associated symptoms: none Therapies tried thus far: none  ROS:  Const: No fevers Skin: As noted in HPI  Past Medical History:  Diagnosis Date  . Anemia   . Essential hypertension 07/18/2017  . History of rheumatic fever   . History of stroke 07/18/2017  . Hypertriglyceridemia 07/18/2017  . Type 2 diabetes mellitus with diabetic neuropathy, unspecified ([place]) 07/18/2017   Allergies  Allergen Reactions  . Influenza Vaccines Other (See Comments)  . Aspirin     Other reaction(s): Other (See Comments) Gastroesophageal reflux Unspecified    . Atorvastatin Other (See Comments)    Myalgias GI upset Unspecified    . Canagliflozin     Other reaction(s): Other (See Comments) Pt reports that the use of Invokana caused acute kidney failure  Unspecified    . Statins     Other reaction(s): Myalgias (intolerance)   Allergies as of 04/10/2018      Reactions   Influenza Vaccines Other (See Comments)   Aspirin    Other reaction(s): Other (See Comments) Gastroesophageal reflux Unspecified    Atorvastatin Other (See Comments)   Myalgias GI upset Unspecified    Canagliflozin    Other reaction(s): Other (See Comments) Pt reports that the use of Invokana caused acute kidney failure  Unspecified    Statins    Other reaction(s): Myalgias (intolerance)      Medication List        Accurate as of 04/10/18  8:09 AM. Always use your most recent med list.          ALPHA LIPOIC ACID PO Take 400 mg 2 (two) times daily by mouth.   carvedilol 12.5 MG tablet Commonly known as:  COREG Take 1 tablet (12.5 mg total) by mouth 2 (two) times daily with a meal.   celecoxib 100 MG capsule Commonly known as:   CELEBREX Take 1 capsule (100 mg total) by mouth daily.   DULoxetine 30 MG capsule Commonly known as:  CYMBALTA Take 30 mg daily by mouth.   fenofibrate micronized 200 MG capsule Commonly known as:  LOFIBRA Take 1 capsule (200 mg total) by mouth daily before breakfast.   gabapentin 600 MG tablet Commonly known as:  NEURONTIN Take 1 tablet (600 mg total) by mouth 3 (three) times daily.   hydrochlorothiazide 25 MG tablet Commonly known as:  HYDRODIURIL Take 1 tablet (25 mg total) by mouth daily.   LEVEMIR FLEXPEN 100 UNIT/ML Pen Generic drug:  Insulin Detemir Inject 60 Units daily at 10 pm into the skin.   lisinopril 40 MG tablet Commonly known as:  PRINIVIL,ZESTRIL Take 1 tablet (40 mg total) by mouth daily.   metFORMIN 500 MG tablet Commonly known as:  GLUCOPHAGE Take 500 mg by mouth 2 (two) times daily with a meal.   multivitamin with minerals tablet Take 1 tablet by mouth daily.   NOVOLOG [place] Sliding scale   silver sulfADIAZINE 1 % cream Commonly known as:  SILVADENE Apply 1 application topically daily.   vitamin B-12 1000 MCG tablet Commonly known as:  CYANOCOBALAMIN Take 1,000 mcg by mouth daily.       BP 128/80 (BP Location: Left Arm, Patient Position: Sitting, Cuff Size: Large)  Pulse 77   Temp 98 F (36.7 C) (Oral)   Ht 5\' 11"  (1.803 m)   Wt 264 lb (119.7 kg)   SpO2 97%   BMI 36.82 kg/m  Gen: awake, alert, appearing stated age Lungs: No accessory muscle use Skin: Elliptical opening over R ant chest wall. No drainage, excessive warmth, erythema, TTP, fluctuance, excoriation; there is also a tearing of the skin on the medial plantar surface of L foot, no drainage, fluctuance, ttp, erythema, or excessive warmth Psych: Age appropriate judgment and insight  Open wound of right chest wall, initial encounter - Plan: silver sulfADIAZINE (SILVADENE) 1 % cream  Injury of left foot, initial encounter  Orders as above. Warning signs and symptoms verbalized  and written down in AVS.  F/u in 9 d if no better, cancel appt if he is doing better.. The patient voiced understanding and agreement to the plan.  [place], DO 04/10/18 8:09 AM

## 2018-04-10 NOTE — Patient Instructions (Addendum)
Apply triple antibiotic ointment over the foot twice daily. Consider a donut pad on the food to offload weight on your foot.  Keep the area clean and dry.  Things to look out for: increasing pain not relieved by ibuprofen/acetaminophen, fevers, spreading redness, drainage of pus, or foul odor.  Cancel the appointment if you are doing better.

## 2018-04-10 NOTE — Progress Notes (Signed)
Pre visit review using our clinic review tool, if applicable. No additional management support is needed unless otherwise documented below in the visit note. 

## 2018-04-18 DIAGNOSIS — R69 Illness, unspecified: Secondary | ICD-10-CM | POA: Diagnosis not present

## 2018-04-19 ENCOUNTER — Ambulatory Visit: Payer: Medicare HMO | Admitting: Family Medicine

## 2018-04-19 DIAGNOSIS — E1165 Type 2 diabetes mellitus with hyperglycemia: Secondary | ICD-10-CM | POA: Diagnosis not present

## 2018-04-19 DIAGNOSIS — M160 Bilateral primary osteoarthritis of hip: Secondary | ICD-10-CM | POA: Diagnosis not present

## 2018-04-19 DIAGNOSIS — N183 Chronic kidney disease, stage 3 (moderate): Secondary | ICD-10-CM | POA: Diagnosis not present

## 2018-04-24 DIAGNOSIS — E1169 Type 2 diabetes mellitus with other specified complication: Secondary | ICD-10-CM | POA: Diagnosis not present

## 2018-04-24 DIAGNOSIS — E1142 Type 2 diabetes mellitus with diabetic polyneuropathy: Secondary | ICD-10-CM | POA: Diagnosis not present

## 2018-04-24 DIAGNOSIS — E1165 Type 2 diabetes mellitus with hyperglycemia: Secondary | ICD-10-CM | POA: Diagnosis not present

## 2018-04-24 DIAGNOSIS — E785 Hyperlipidemia, unspecified: Secondary | ICD-10-CM | POA: Diagnosis not present

## 2018-04-24 DIAGNOSIS — N183 Chronic kidney disease, stage 3 (moderate): Secondary | ICD-10-CM | POA: Diagnosis not present

## 2018-05-26 DIAGNOSIS — R69 Illness, unspecified: Secondary | ICD-10-CM | POA: Diagnosis not present

## 2018-05-29 ENCOUNTER — Other Ambulatory Visit: Payer: Self-pay | Admitting: Family Medicine

## 2018-06-08 ENCOUNTER — Telehealth: Payer: Self-pay | Admitting: Family Medicine

## 2018-06-08 MED ORDER — DULOXETINE HCL 30 MG PO CPEP: 30.0000 mg | ORAL_CAPSULE | Freq: Every day | ORAL | 1 refills | Status: DC

## 2018-06-08 NOTE — Telephone Encounter (Signed)
Copied from [place] [id number]. Topic: Quick Communication - Rx Refill/Question >> Jun 08, 2018  8:34 AM Burchel, Abbi R wrote: Medication:  DULoxetine (CYMBALTA) 30 MG capsule   Preferred Pharmacy:  [facility]  Pt wanted to advise that a rx request would be coming in from [facility], since Dr Nani Ravens has now taken over mgmt of this medication for pt.

## 2018-07-07 DIAGNOSIS — R69 Illness, unspecified: Secondary | ICD-10-CM | POA: Diagnosis not present

## 2018-07-10 ENCOUNTER — Ambulatory Visit: Payer: Self-pay

## 2018-07-10 NOTE — Telephone Encounter (Signed)
Pt. Reports he has had increased leg swelling x 1 week.Reports he had done "a lot of driving recently." "My left leg is always worse than the right." Noticed a red area " the size of a silver dollar on the left leg as well. States he is on a "fluid pill." Denies any chest pain or shortness of breath. Appointment made for this week per the agent. Instructed pt. If swelling worsens before his appointment, to call back. Answer Assessment - Initial Assessment Questions 1. ONSET: "When did the swelling start?" (e.g., minutes, hours, days)      1 weeks 2. LOCATION: "What part of the leg is swollen?"  "Are both legs swollen or just one leg?"     8 inches above the ankle 3. SEVERITY: "How bad is the swelling?" (e.g., localized; mild, moderate, severe)  - Localized - small area of swelling localized to one leg  - MILD pedal edema - swelling limited to foot and ankle, pitting edema < 1/4 inch (6 mm) deep, rest and elevation eliminate most or all swelling  - MODERATE edema - swelling of lower leg to knee, pitting edema > 1/4 inch (6 mm) deep, rest and elevation only partially reduce swelling  - SEVERE edema - swelling extends above knee, facial or hand swelling present      Mild  4. REDNESS: "Does the swelling look red or infected?"     Left leg has a red spot silver dollar size 5. PAIN: "Is the swelling painful to touch?" If so, ask: "How painful is it?"   (Scale 1-10; mild, moderate or severe)     No 6. FEVER: "Do you have a fever?" If so, ask: "What is it, how was it measured, and when did it start?"      No 7. CAUSE: "What do you think is causing the leg swelling?"     Unsure 8. MEDICAL HISTORY: "Do you have a history of heart failure, kidney disease, liver failure, or cancer?"     Diabetes 9. RECURRENT SYMPTOM: "Have you had leg swelling before?" If so, ask: "When was the last time?" "What happened that time?"     Yes 10. OTHER SYMPTOMS: "Do you have any other symptoms?" (e.g., chest pain,  difficulty breathing)       No 11. PREGNANCY: "Is there any chance you are pregnant?" "When was your last menstrual period?"       n/a  Protocols used: LEG SWELLING AND EDEMA-A-AH

## 2018-07-13 ENCOUNTER — Encounter: Payer: Self-pay | Admitting: Family Medicine

## 2018-07-13 ENCOUNTER — Ambulatory Visit (INDEPENDENT_AMBULATORY_CARE_PROVIDER_SITE_OTHER): Payer: Medicare HMO | Admitting: Family Medicine

## 2018-07-13 VITALS — BP 142/65 | HR 78 | Temp 98.3°F | Ht 71.0 in | Wt 268.2 lb

## 2018-07-13 DIAGNOSIS — M6798 Unspecified disorder of synovium and tendon, other site: Secondary | ICD-10-CM

## 2018-07-13 DIAGNOSIS — L920 Granuloma annulare: Secondary | ICD-10-CM | POA: Diagnosis not present

## 2018-07-13 DIAGNOSIS — M67952 Unspecified disorder of synovium and tendon, left thigh: Secondary | ICD-10-CM

## 2018-07-13 DIAGNOSIS — L989 Disorder of the skin and subcutaneous tissue, unspecified: Secondary | ICD-10-CM

## 2018-07-13 DIAGNOSIS — M67951 Unspecified disorder of synovium and tendon, right thigh: Secondary | ICD-10-CM

## 2018-07-13 MED ORDER — CLOBETASOL PROPIONATE 0.05 % EX CREA: 1.0000 "application " | TOPICAL_CREAM | Freq: Two times a day (BID) | CUTANEOUS | 0 refills | Status: DC

## 2018-07-13 MED ORDER — MELOXICAM 15 MG PO TABS: 15.0000 mg | ORAL_TABLET | Freq: Every day | ORAL | 0 refills | Status: DC

## 2018-07-13 NOTE — Progress Notes (Signed)
Chief Complaint  Patient presents with  . Hip Pain    c/o bilateral hip pain that has been getting worse over the past 2-3 years. Also would like spot looked at on left left.     Tommy Green is a 72 y.o. male here for a skin complaint.  Duration: several weeks Location: face and LLE Pruritic? Yes Painful? No Drainage? No New soaps/lotions/topicals/detergents? No Therapies tried thus far: none  B/l hip pain. Pred has not helped in past. No inj or change in activity. Denies neurologic s/s's. 6 mo duration, recently got worse. Inj have not been helpful.   ROS:  MSK: As noted in HPI Skin: As noted in HPI  Past Medical History:  Diagnosis Date  . Anemia   . Essential hypertension 07/18/2017  . History of rheumatic fever   . History of stroke 07/18/2017  . Hypertriglyceridemia 07/18/2017  . Type 2 diabetes mellitus with diabetic neuropathy, unspecified ([place]) 07/18/2017   Allergies  Allergen Reactions  . Influenza Vaccines Other (See Comments)  . Aspirin     Other reaction(s): Other (See Comments) Gastroesophageal reflux Unspecified    . Atorvastatin Other (See Comments)    Myalgias GI upset Unspecified    . Canagliflozin     Other reaction(s): Other (See Comments) Pt reports that the use of Invokana caused acute kidney failure  Unspecified    . Statins     Other reaction(s): Myalgias (intolerance)   BP (!) 142/65 (BP Location: Right Arm, Patient Position: Sitting, Cuff Size: Large)   Pulse 78   Temp 98.3 F (36.8 C) (Oral)   Ht 5\' 11"  (1.803 m)   Wt 268 lb 3.2 oz (121.7 kg)   SpO2 96%   BMI 37.41 kg/m  Gen: awake, alert, appearing stated age Lungs: No accessory muscle use MSK: +TTP over greater troch and distal glute med b/l; +TTP with resisted abduction b/l; antalgic gait Skin: see below. No drainage, erythema, TTP, fluctuance, excoriation Psych: Age appropriate judgment and insight  Tendinopathy of left gluteus medius - Plan: meloxicam (MOBIC) 15 MG  tablet  Tendinopathy of right gluteus medius - Plan: meloxicam (MOBIC) 15 MG tablet  Skin lesion of face  Granuloma annulare - Plan: clobetasol cream (TEMOVATE) 0.05 %  Orders as above. #1/2- ice, stretches/exercises, nsaid, Tylenol. #3- OTC steroid cream; likely will need to freeze some lesions and shave the L sided one for AK vs neoplasm of uncertain behavior. #4- GA, steroid cream. If no improvement, will try intralesional steroid.  OK w BP given age. F/u in 2 weeks.. The patient voiced understanding and agreement to the plan.  [place], DO 07/13/18 4:25 PM

## 2018-07-13 NOTE — Patient Instructions (Signed)
Ice/cold pack over area for 10-15 min twice daily.  OK to take Tylenol 1000 mg (2 extra strength tabs) or 975 mg (3 regular strength tabs) every 6 hours as needed.  Use over the counter 1% hydrocortisone over the areas on your face twice daily for 10 days.  Gluteus Medius Syndrome Rehab It is normal to feel mild stretching, pulling, tightness, or discomfort as you do these exercises, but you should stop right away if you feel sudden pain or your pain gets worse.   Stretching and range of motion exercise This exercise warms up your muscles and joints and improves the movement and flexibility of your hip and pelvis. This exercise also helps to relieve pain and stiffness. Exercise A: Lunge (hip flexor stretch)     1. Kneel on the floor on your left / right knee. Bend your other knee so it is directly over your ankle. 2. Keep good posture with your head over your shoulders. Tuck your tailbone underneath you. This will prevent your back from arching too much. 3. You should feel a gentle stretch in the front of your thigh or hip. If you do not feel a stretch, slowly lunge forward with your chest up. 4. Hold this position for 30 seconds. 5. Slowly return to the starting position. Repeat 2 times. Complete this exercise 3 times per week. Strengthening exercises These exercises build strength and endurance in your hip and pelvis. Endurance is the ability to use your muscles for a long time, even after they get tired. Exercise B: Bridge (hip extensors)    1. Lie on your back on a firm surface with your knees bent and your feet flat on the floor. 2. Tighten your buttocks muscles and lift your bottom off the floor until the trunk of your body is level with your thighs. ? You should feel the muscles working in your buttocks and the back of your thighs. If this exercise is too easy, cross your arms over your chest or lift one leg while your bottom is up off the floor. ? Do not arch your back. 3. Hold  this position for 3 seconds. 4. Slowly lower your hips to the starting position. 5. Let your muscles relax completely between repetitions. Repeat 2 times. Complete this exercise 3 times per week. Exercise C: Straight leg raises (hip abductors)    1. Lie on your side with your left / right leg in the top position. Lie so your head, shoulder, knee, and hip line up. Bend your bottom knee to help you balance. 2. Lift your top leg up 4-6 inches (10-15 cm), keeping your toes pointed straight ahead. 3. Hold this position for 2 seconds. 4. Slowly lower your leg to the starting position and let your muscles relax completely. Repeat for a total of 10 repetitions. Repeat 2 times. Complete this exercise 3 times per week. Exercise D: Hip abductors and external rotators, quadruped 1. Get on your hands and knees on a firm, lightly padded surface. Your hands should be directly below your shoulders, and your knees should be directly below your hips. 2. Lift your left / right knee out to the side. Keep your knee bent. Do not twist your body. 3. Hold this position for 3 seconds. 4. Slowly lower your leg. Repeat for a total of 10 repetitions.  Repeat 2 times. Complete this exercise 3 times per week. Exercise E: Single leg stand 1. Stand near a counter or door frame to hold onto as needed. It is  helpful to look in a mirror for this exercise so you can watch your hip. 2. Squeeze your left / right buttock muscles then lift up your other foot. Do not let your left / righthip push out to the side. 3. Hold this position for 3 seconds. Repeat for a total of 10 repetitions. Repeat 2 times. Complete this exercise 3 times per week. Make sure you discuss any questions you have with your health care provider. Document Released: 10/04/2005 Document Revised: 06/10/2016 Document Reviewed: 09/16/2015 Elsevier Interactive Patient Education  [employer].

## 2018-07-20 DIAGNOSIS — R69 Illness, unspecified: Secondary | ICD-10-CM | POA: Diagnosis not present

## 2018-07-27 ENCOUNTER — Encounter: Payer: Self-pay | Admitting: Family Medicine

## 2018-07-27 ENCOUNTER — Ambulatory Visit (INDEPENDENT_AMBULATORY_CARE_PROVIDER_SITE_OTHER): Payer: Medicare HMO | Admitting: Family Medicine

## 2018-07-27 VITALS — BP 148/80 | HR 74 | Temp 98.4°F | Ht 71.0 in | Wt 269.0 lb

## 2018-07-27 DIAGNOSIS — L920 Granuloma annulare: Secondary | ICD-10-CM

## 2018-07-27 DIAGNOSIS — L570 Actinic keratosis: Secondary | ICD-10-CM

## 2018-07-27 DIAGNOSIS — E1165 Type 2 diabetes mellitus with hyperglycemia: Secondary | ICD-10-CM | POA: Diagnosis not present

## 2018-07-27 DIAGNOSIS — E782 Mixed hyperlipidemia: Secondary | ICD-10-CM | POA: Diagnosis not present

## 2018-07-27 DIAGNOSIS — L820 Inflamed seborrheic keratosis: Secondary | ICD-10-CM | POA: Diagnosis not present

## 2018-07-27 DIAGNOSIS — D489 Neoplasm of uncertain behavior, unspecified: Secondary | ICD-10-CM

## 2018-07-27 DIAGNOSIS — Z6837 Body mass index (BMI) 37.0-37.9, adult: Secondary | ICD-10-CM | POA: Diagnosis not present

## 2018-07-27 DIAGNOSIS — L989 Disorder of the skin and subcutaneous tissue, unspecified: Secondary | ICD-10-CM

## 2018-07-27 DIAGNOSIS — E876 Hypokalemia: Secondary | ICD-10-CM | POA: Diagnosis not present

## 2018-07-27 MED ORDER — GABAPENTIN 600 MG PO TABS: 600.0000 mg | ORAL_TABLET | Freq: Three times a day (TID) | ORAL | 1 refills | Status: DC

## 2018-07-27 NOTE — Progress Notes (Addendum)
Chief Complaint  Patient presents with  . Follow-up    face    Subjective: Patient is a 72 y.o. male here for skin f/u.  GA on LLE tx'd w TCS that has improved. Still applying.   Lesions on head have not improved. + hx of skin cancer.    ROS: Skin: As noted in HPI  Past Medical History:  Diagnosis Date  . Anemia   . Essential hypertension 07/18/2017  . History of rheumatic fever   . History of stroke 07/18/2017  . Hypertriglyceridemia 07/18/2017  . Type 2 diabetes mellitus with diabetic neuropathy, unspecified ([place]) 07/18/2017    Objective: BP (!) 148/80 (BP Location: Left Arm, Patient Position: Sitting, Cuff Size: Normal)   Pulse 74   Temp 98.4 F (36.9 C) (Oral)   Ht 5\' 11"  (1.803 m)   Wt 269 lb (122 kg)   SpO2 94%   BMI 37.52 kg/m  General: Awake, appears stated age Skin: See below; area that was shaved measured 1.6 cm x 1.1 cm. Lungs: No accessory muscle use Psych: Age appropriate judgment and insight, normal affect and mood   L posterior scalp     LLE   R posterior scalp    Procedure note; shave biopsy of L facial lesion Informed consent was obtained. The area was cleaned with alcohol and injected with 1 mL of 1% lidocaine with epinephrine. A Dermablade was slightly bent and used to cut under the area of interest. The specimen was placed in a sterile specimen cup and sent to the lab. The area was then cauterized ensuring adequate hemostasis. The area was dressed with triple antibiotic ointment and a bandage. There were no complications noted. The patient tolerated the procedure well.  Procedure note: cryotherapy Informed consent obtained. Indication: Actinic keratosis Areas in question were demarcated and liquid nitrogen used to create a halo around A total of 26 lesions frozen Pt tolerated well No immediate complications noted   Assessment and Plan: Neoplasm of uncertain behavior - Plan: Dermatology pathology, PR SHAV SKIN LES 1.1-2.0 CM  FACE,FACIAL  Skin lesion  Granuloma annulare  AK (actinic keratosis) - Plan: PR DESTRUCTION BENIGN LESIONS UP TO 14, PR DESTRUCTION BENIGN LESIONS 15 OR MORE  Orders as above. L posterior scalp shows no recent change. Will cont to monitor.  Other lesions suspicious for AK. Removing lesion to eval for malignancy. May need to refer.   After care instructions verbalized and written down. Continue topical corticosteroid for granuloma annulare. F/u pending above. The patient voiced understanding and agreement to the plan.  [place], DO 07/27/18  5:06 PM

## 2018-07-27 NOTE — Patient Instructions (Addendum)
Do not shower for the rest of the day. When you do wash it, use only soap and water. Do not vigorously scrub. Apply triple antibiotic ointment (like Neosporin) twice daily. Keep the area clean and dry.   Things to look out for: increasing pain not relieved by ibuprofen/acetaminophen, fevers, spreading redness, drainage of pus, or foul odor.  The areas that we froze should turn dark and fall off.  Give [place] 1 week to get the results of your biopsy back.  Let us know if you need anything.

## 2018-07-27 NOTE — Progress Notes (Signed)
Pre visit review using our clinic review tool, if applicable. No additional management support is needed unless otherwise documented below in the visit note. 

## 2018-08-01 ENCOUNTER — Telehealth: Payer: Self-pay | Admitting: *Deleted

## 2018-08-01 NOTE — Telephone Encounter (Signed)
Received Dermatopathology Report results from [facility]; forwarded to provider/SLS 10/15

## 2018-08-05 ENCOUNTER — Other Ambulatory Visit: Payer: Self-pay | Admitting: Family Medicine

## 2018-08-05 DIAGNOSIS — M67952 Unspecified disorder of synovium and tendon, left thigh: Secondary | ICD-10-CM

## 2018-08-05 DIAGNOSIS — M6798 Unspecified disorder of synovium and tendon, other site: Secondary | ICD-10-CM

## 2018-08-05 DIAGNOSIS — M67951 Unspecified disorder of synovium and tendon, right thigh: Secondary | ICD-10-CM

## 2018-08-24 ENCOUNTER — Encounter: Payer: Self-pay | Admitting: Family Medicine

## 2018-08-24 ENCOUNTER — Ambulatory Visit (INDEPENDENT_AMBULATORY_CARE_PROVIDER_SITE_OTHER): Payer: Medicare HMO | Admitting: Family Medicine

## 2018-08-24 VITALS — BP 130/82 | HR 73 | Temp 98.2°F | Ht 71.0 in | Wt 268.4 lb

## 2018-08-24 DIAGNOSIS — M25551 Pain in right hip: Secondary | ICD-10-CM

## 2018-08-24 DIAGNOSIS — L570 Actinic keratosis: Secondary | ICD-10-CM

## 2018-08-24 DIAGNOSIS — M25552 Pain in left hip: Secondary | ICD-10-CM

## 2018-08-24 NOTE — Patient Instructions (Addendum)
If you do not hear anything about your referral in the next 1-2 weeks, call our office and ask for an update.  Ice/cold pack over area for 10-15 min twice daily.  Heat (pad or rice pillow in microwave) over affected area, 10-15 minutes twice daily.   OK to take Tylenol 1000 mg (2 extra strength tabs) or 975 mg (3 regular strength tabs) every 6 hours as needed.  Let us know if you need anything.

## 2018-08-24 NOTE — Progress Notes (Signed)
Pre visit review using our clinic review tool, if applicable. No additional management support is needed unless otherwise documented below in the visit note. 

## 2018-08-24 NOTE — Progress Notes (Signed)
Chief Complaint  Patient presents with  . Follow-up    4 week followup    Subjective: Patient is a 72 y.o. male here for b/l hip pain.  Continued b/l hip pain. Chronic, has tried injections and home stretches/exercises. Unable to do all the injections. Takes Tylenol with some relief. Walking is very difficult. No new numbness/tingling.  Areas on scalp did well overall. Some new areas present.   ROS: Skin: +lesions MSK: +hip pain  Past Medical History:  Diagnosis Date  . Anemia   . Essential hypertension 07/18/2017  . History of rheumatic fever   . History of stroke 07/18/2017  . Hypertriglyceridemia 07/18/2017  . Type 2 diabetes mellitus with diabetic neuropathy, unspecified ([place]) 07/18/2017    Objective: BP 130/82 (BP Location: Left Arm, Patient Position: Sitting, Cuff Size: Large)   Pulse 73   Temp 98.2 F (36.8 C) (Oral)   Ht 5\' 11"  (1.803 m)   Wt 268 lb 6 oz (121.7 kg)   SpO2 97%   BMI 37.43 kg/m  General: Awake, appears stated age MSK: +TTP over b/l greater troch, neg log roll, stinchfield, FABER, FADDIR Neuro: Gait normal, no cerebellar signs Skin: See below Lungs: No accessory muscle use Psych: Age appropriate judgment and insight, normal affect and mood  Media Information   Scalp  Procedure note: cryotherapy Verbal consent obtained 7 skin lesions treated Liquid nitrogen was applied via a thin spray creating an ice ball with 1-2 mm corona surrounding the lesion The patient tolerated the procedure well There were no immediate complications noted  Assessment and Plan: Bilateral hip pain - Plan: Ambulatory referral to Physical Therapy  AK (actinic keratosis) - Plan: PR DESTRUCTION BENIGN LESIONS UP TO 14  Orders as above. Refer to PT, cont home stretches/exercises, Tylenol, heat, ice. Freeze above. Recheck in 1 mo, if no better, will refer to derm. The patient voiced understanding and agreement to the plan.  [place], DO 08/24/18    8:53 AM

## 2018-08-31 DIAGNOSIS — M25652 Stiffness of left hip, not elsewhere classified: Secondary | ICD-10-CM | POA: Diagnosis not present

## 2018-08-31 DIAGNOSIS — R69 Illness, unspecified: Secondary | ICD-10-CM | POA: Diagnosis not present

## 2018-08-31 DIAGNOSIS — M25552 Pain in left hip: Secondary | ICD-10-CM | POA: Diagnosis not present

## 2018-08-31 DIAGNOSIS — M25551 Pain in right hip: Secondary | ICD-10-CM | POA: Diagnosis not present

## 2018-08-31 DIAGNOSIS — M25651 Stiffness of right hip, not elsewhere classified: Secondary | ICD-10-CM | POA: Diagnosis not present

## 2018-09-05 ENCOUNTER — Telehealth: Payer: Self-pay | Admitting: *Deleted

## 2018-09-05 DIAGNOSIS — M25651 Stiffness of right hip, not elsewhere classified: Secondary | ICD-10-CM | POA: Diagnosis not present

## 2018-09-05 DIAGNOSIS — M25552 Pain in left hip: Secondary | ICD-10-CM | POA: Diagnosis not present

## 2018-09-05 DIAGNOSIS — M25652 Stiffness of left hip, not elsewhere classified: Secondary | ICD-10-CM | POA: Diagnosis not present

## 2018-09-05 DIAGNOSIS — M25551 Pain in right hip: Secondary | ICD-10-CM | POA: Diagnosis not present

## 2018-09-05 NOTE — Telephone Encounter (Signed)
Received Physician Orders from [facility]; forwarded to provider/SLS 11/19

## 2018-09-07 DIAGNOSIS — M25551 Pain in right hip: Secondary | ICD-10-CM | POA: Diagnosis not present

## 2018-09-07 DIAGNOSIS — M25552 Pain in left hip: Secondary | ICD-10-CM | POA: Diagnosis not present

## 2018-09-07 DIAGNOSIS — M25652 Stiffness of left hip, not elsewhere classified: Secondary | ICD-10-CM | POA: Diagnosis not present

## 2018-09-07 DIAGNOSIS — M25651 Stiffness of right hip, not elsewhere classified: Secondary | ICD-10-CM | POA: Diagnosis not present

## 2018-09-08 DIAGNOSIS — M25551 Pain in right hip: Secondary | ICD-10-CM | POA: Diagnosis not present

## 2018-09-08 DIAGNOSIS — M25652 Stiffness of left hip, not elsewhere classified: Secondary | ICD-10-CM | POA: Diagnosis not present

## 2018-09-08 DIAGNOSIS — M25651 Stiffness of right hip, not elsewhere classified: Secondary | ICD-10-CM | POA: Diagnosis not present

## 2018-09-08 DIAGNOSIS — M25552 Pain in left hip: Secondary | ICD-10-CM | POA: Diagnosis not present

## 2018-09-12 DIAGNOSIS — M25552 Pain in left hip: Secondary | ICD-10-CM | POA: Diagnosis not present

## 2018-09-12 DIAGNOSIS — M25551 Pain in right hip: Secondary | ICD-10-CM | POA: Diagnosis not present

## 2018-09-12 DIAGNOSIS — M25651 Stiffness of right hip, not elsewhere classified: Secondary | ICD-10-CM | POA: Diagnosis not present

## 2018-09-12 DIAGNOSIS — M25652 Stiffness of left hip, not elsewhere classified: Secondary | ICD-10-CM | POA: Diagnosis not present

## 2018-09-13 DIAGNOSIS — M25652 Stiffness of left hip, not elsewhere classified: Secondary | ICD-10-CM | POA: Diagnosis not present

## 2018-09-13 DIAGNOSIS — M25552 Pain in left hip: Secondary | ICD-10-CM | POA: Diagnosis not present

## 2018-09-13 DIAGNOSIS — M25651 Stiffness of right hip, not elsewhere classified: Secondary | ICD-10-CM | POA: Diagnosis not present

## 2018-09-13 DIAGNOSIS — M25551 Pain in right hip: Secondary | ICD-10-CM | POA: Diagnosis not present

## 2018-09-20 ENCOUNTER — Telehealth: Payer: Self-pay | Admitting: *Deleted

## 2018-09-20 NOTE — Telephone Encounter (Signed)
Received End of Care for review from [facility]; forwarded to provider/SLS 12/04

## 2018-09-21 DIAGNOSIS — R69 Illness, unspecified: Secondary | ICD-10-CM | POA: Diagnosis not present

## 2018-09-25 ENCOUNTER — Ambulatory Visit (INDEPENDENT_AMBULATORY_CARE_PROVIDER_SITE_OTHER): Payer: Medicare HMO | Admitting: Family Medicine

## 2018-09-25 ENCOUNTER — Encounter: Payer: Self-pay | Admitting: Family Medicine

## 2018-09-25 VITALS — BP 128/78 | HR 74 | Temp 98.1°F | Ht 71.0 in | Wt 267.0 lb

## 2018-09-25 DIAGNOSIS — L989 Disorder of the skin and subcutaneous tissue, unspecified: Secondary | ICD-10-CM

## 2018-09-25 MED ORDER — DICLOFENAC SODIUM 75 MG PO TBEC: 75.0000 mg | DELAYED_RELEASE_TABLET | Freq: Two times a day (BID) | ORAL | 2 refills | Status: DC

## 2018-09-25 NOTE — Progress Notes (Signed)
Pre visit review using our clinic review tool, if applicable. No additional management support is needed unless otherwise documented below in the visit note. 

## 2018-09-25 NOTE — Patient Instructions (Signed)
If you do not hear anything about your referral in the next 1-2 weeks, call our office and ask for an update.  Let us know if you need anything.  

## 2018-09-25 NOTE — Progress Notes (Signed)
Chief Complaint  Patient presents with  . Follow-up    Tommy Green is a 72 y.o. male here for a skin complaint.  Had lesions ofrozen on scalp. Most improved, some are still there. Also has a dark lesion on chest that is starting to grow. +hx of skin cancer.   ROS:  Skin: As noted in HPI  Past Medical History:  Diagnosis Date  . Anemia   . Essential hypertension 07/18/2017  . History of rheumatic fever   . History of stroke 07/18/2017  . Hypertriglyceridemia 07/18/2017  . Type 2 diabetes mellitus with diabetic neuropathy, unspecified ([place]) 07/18/2017    BP 128/78 (BP Location: Left Arm, Patient Position: Sitting, Cuff Size: Normal)   Pulse 74   Temp 98.1 F (36.7 C) (Oral)   Ht 5\' 11"  (1.803 m)   Wt 267 lb (121.1 kg)   SpO2 96%   BMI 37.24 kg/m  Gen: awake, alert, appearing stated age Lungs: No accessory muscle use Skin: see below. No drainage, erythema, TTP, fluctuance, excoriation Psych: Age appropriate judgment and insight   Scalp   R chest   Skin lesion - Plan: Ambulatory referral to Dermatology  Refer derm. Worry for skin CA. F/u in 3 mo for med ck. The patient voiced understanding and agreement to the plan.  [place], DO 09/25/18 8:48 AM

## 2018-10-17 DIAGNOSIS — D485 Neoplasm of uncertain behavior of skin: Secondary | ICD-10-CM | POA: Diagnosis not present

## 2018-10-17 DIAGNOSIS — L570 Actinic keratosis: Secondary | ICD-10-CM | POA: Diagnosis not present

## 2018-10-20 DIAGNOSIS — L821 Other seborrheic keratosis: Secondary | ICD-10-CM | POA: Diagnosis not present

## 2018-10-24 DIAGNOSIS — L821 Other seborrheic keratosis: Secondary | ICD-10-CM | POA: Diagnosis not present

## 2018-10-24 DIAGNOSIS — L570 Actinic keratosis: Secondary | ICD-10-CM | POA: Diagnosis not present

## 2018-10-24 DIAGNOSIS — D2322 Other benign neoplasm of skin of left ear and external auricular canal: Secondary | ICD-10-CM | POA: Diagnosis not present

## 2018-10-30 DIAGNOSIS — E876 Hypokalemia: Secondary | ICD-10-CM | POA: Diagnosis not present

## 2018-10-30 DIAGNOSIS — E1165 Type 2 diabetes mellitus with hyperglycemia: Secondary | ICD-10-CM | POA: Diagnosis not present

## 2018-10-30 DIAGNOSIS — Z6837 Body mass index (BMI) 37.0-37.9, adult: Secondary | ICD-10-CM | POA: Diagnosis not present

## 2018-10-30 DIAGNOSIS — E782 Mixed hyperlipidemia: Secondary | ICD-10-CM | POA: Diagnosis not present

## 2018-11-04 DIAGNOSIS — G8929 Other chronic pain: Secondary | ICD-10-CM | POA: Diagnosis not present

## 2018-11-04 DIAGNOSIS — E1142 Type 2 diabetes mellitus with diabetic polyneuropathy: Secondary | ICD-10-CM | POA: Diagnosis not present

## 2018-11-04 DIAGNOSIS — E785 Hyperlipidemia, unspecified: Secondary | ICD-10-CM | POA: Diagnosis not present

## 2018-11-04 DIAGNOSIS — E1151 Type 2 diabetes mellitus with diabetic peripheral angiopathy without gangrene: Secondary | ICD-10-CM | POA: Diagnosis not present

## 2018-11-04 DIAGNOSIS — I10 Essential (primary) hypertension: Secondary | ICD-10-CM | POA: Diagnosis not present

## 2018-11-04 DIAGNOSIS — Z6841 Body Mass Index (BMI) 40.0 and over, adult: Secondary | ICD-10-CM | POA: Diagnosis not present

## 2018-11-04 DIAGNOSIS — Z008 Encounter for other general examination: Secondary | ICD-10-CM | POA: Diagnosis not present

## 2018-11-04 DIAGNOSIS — M1990 Unspecified osteoarthritis, unspecified site: Secondary | ICD-10-CM | POA: Diagnosis not present

## 2018-11-04 DIAGNOSIS — Z794 Long term (current) use of insulin: Secondary | ICD-10-CM | POA: Diagnosis not present

## 2018-11-04 DIAGNOSIS — K219 Gastro-esophageal reflux disease without esophagitis: Secondary | ICD-10-CM | POA: Diagnosis not present

## 2018-11-13 DIAGNOSIS — L570 Actinic keratosis: Secondary | ICD-10-CM | POA: Diagnosis not present

## 2018-11-15 ENCOUNTER — Telehealth: Payer: Self-pay | Admitting: Family Medicine

## 2018-11-15 NOTE — Telephone Encounter (Signed)
Copied from [place] [id number]. Topic: General - Other >> Nov 14, 2018 12:26 PM Yvette Rack wrote: Reason for CRM: Pt stated that it is time for him to renew his disability card and he would like the form to be mailed to him so he can completed it. Pt stated his card will expire in about a month. Pt requests call back. Cb# [phone]   Form printed/signed and given to PCP to sign. Called the patient informed will mail today.

## 2018-11-20 ENCOUNTER — Other Ambulatory Visit: Payer: Self-pay | Admitting: Family Medicine

## 2018-11-20 DIAGNOSIS — R69 Illness, unspecified: Secondary | ICD-10-CM | POA: Diagnosis not present

## 2018-11-20 NOTE — Telephone Encounter (Signed)
Copied from [place] [id number]. Topic: Quick Communication - Rx Refill/Question >> Nov 20, 2018 10:43 AM Percell Belt A wrote: Medication: carvedilol (COREG) 12.5 MG tablet [[id number]]  DULoxetine (CYMBALTA) 30 MG capsule [[id number]]   Has the patient contacted their pharmacy? Need a new script and needs a 90 day supply on both  (Agent: If no, request that the patient contact the pharmacy for the refill.) (Agent: If yes, when and what did the pharmacy advise?)  Preferred Pharmacy (with phone number or street name): [facility] [place], [place] to [facility] [phone] (Phone)   Agent: Please be advised that RX refills may take up to 3 business days. We ask that you follow-up with your pharmacy.

## 2018-12-09 ENCOUNTER — Other Ambulatory Visit: Payer: Self-pay | Admitting: Family Medicine

## 2018-12-20 ENCOUNTER — Other Ambulatory Visit: Payer: Self-pay | Admitting: Family Medicine

## 2018-12-20 ENCOUNTER — Telehealth: Payer: Self-pay | Admitting: Family Medicine

## 2018-12-20 DIAGNOSIS — E781 Pure hyperglyceridemia: Secondary | ICD-10-CM

## 2018-12-20 NOTE — Telephone Encounter (Signed)
Up to him. We can order lipid panel and CMP, dx hypertriglyceridemia if he wants to come in before. TY.

## 2018-12-20 NOTE — Telephone Encounter (Signed)
Copied from [place] [phone]. Topic: General - Inquiry >> Dec 19, 2018  1:21 PM Sheran Luz wrote: Reason for CRM: Patient inquired as to if he needs to come in before appointment on 3/9 for separate lab visit. Advised patient that there is no order in currently. He would like to know if he needs to come in for pre visit labs. Please advise.   Called the patient scheduled lab appt/  Put in the order.

## 2018-12-22 ENCOUNTER — Other Ambulatory Visit: Payer: Self-pay | Admitting: Family Medicine

## 2018-12-22 ENCOUNTER — Other Ambulatory Visit (INDEPENDENT_AMBULATORY_CARE_PROVIDER_SITE_OTHER): Payer: Medicare HMO

## 2018-12-22 DIAGNOSIS — E781 Pure hyperglyceridemia: Secondary | ICD-10-CM | POA: Diagnosis not present

## 2018-12-22 LAB — COMPREHENSIVE METABOLIC PANEL
ALT: 13 U/L (ref 0–53)
AST: 12 U/L (ref 0–37)
Albumin: 4.2 g/dL (ref 3.5–5.2)
Alkaline Phosphatase: 69 U/L (ref 39–117)
BUN: 55 mg/dL — ABNORMAL HIGH (ref 6–23)
CO2: 26 mEq/L (ref 19–32)
Calcium: 9.3 mg/dL (ref 8.4–10.5)
Chloride: 104 mEq/L (ref 96–112)
Creatinine, Ser: 1.82 mg/dL — ABNORMAL HIGH (ref 0.40–1.50)
GFR: 36.51 mL/min — ABNORMAL LOW (ref >60.00)
Glucose, Bld: 170 mg/dL — ABNORMAL HIGH (ref 70–99)
Potassium: 5.3 mEq/L — ABNORMAL HIGH (ref 3.5–5.1)
Sodium: 138 mEq/L (ref 135–145)
Total Bilirubin: 0.2 mg/dL (ref 0.2–1.2)
Total Protein: 6.4 g/dL (ref 6.0–8.3)

## 2018-12-22 LAB — LIPID PANEL
CHOL/HDL RATIO: 6
Cholesterol: 136 mg/dL (ref 0–200)
HDL: 24.1 mg/dL — ABNORMAL LOW (ref >39.00)
Triglycerides: 502 mg/dL — ABNORMAL HIGH (ref 0.0–149.0)

## 2018-12-22 LAB — LDL CHOLESTEROL, DIRECT: Direct LDL: 58 mg/dL

## 2018-12-22 MED ORDER — ICOSAPENT ETHYL 1 G PO CAPS: ORAL_CAPSULE | ORAL | 5 refills | Status: DC

## 2018-12-24 ENCOUNTER — Other Ambulatory Visit: Payer: Self-pay | Admitting: Family Medicine

## 2018-12-25 ENCOUNTER — Encounter: Payer: Self-pay | Admitting: Family Medicine

## 2018-12-25 ENCOUNTER — Ambulatory Visit (INDEPENDENT_AMBULATORY_CARE_PROVIDER_SITE_OTHER): Payer: Medicare HMO | Admitting: Family Medicine

## 2018-12-25 VITALS — BP 148/80 | HR 78 | Temp 98.1°F | Ht 71.0 in | Wt 258.5 lb

## 2018-12-25 DIAGNOSIS — G629 Polyneuropathy, unspecified: Secondary | ICD-10-CM

## 2018-12-25 DIAGNOSIS — I10 Essential (primary) hypertension: Secondary | ICD-10-CM | POA: Diagnosis not present

## 2018-12-25 DIAGNOSIS — E781 Pure hyperglyceridemia: Secondary | ICD-10-CM

## 2018-12-25 DIAGNOSIS — Z23 Encounter for immunization: Secondary | ICD-10-CM

## 2018-12-25 MED ORDER — ICOSAPENT ETHYL 1 G PO CAPS: ORAL_CAPSULE | ORAL | 5 refills | Status: DC

## 2018-12-25 NOTE — Progress Notes (Signed)
Chief Complaint  Patient presents with  . Follow-up    Subjective: Hyperlipidemia Patient presents for Hypetrglyridemia follow up. Currently taking fenofibrate 200 mg/dand compliance with treatment thus far has been good. He denies myalgias. He is now adhering to a healthy diet. Exercise: walking The patient is not known to have coexisting coronary artery disease.  Hypertension Patient presents for hypertension follow up. He does monitor home blood pressures. Blood pressures ranging on average from 130-140's/70's. He is compliant with medications. Patient has these side effects of medication: none He is now adhering to a healthy diet overall. Exercise: walking  Taking gabapentin for pain. Working well. No AE's.   ROS: Heart: Denies chest pain Lungs: Denies SOB  Past Medical History:  Diagnosis Date  . Anemia   . Essential hypertension 07/18/2017  . History of rheumatic fever   . History of stroke 07/18/2017  . Hypertriglyceridemia 07/18/2017  . Type 2 diabetes mellitus with diabetic neuropathy, unspecified ([place]) 07/18/2017    Objective: BP (!) 148/80 (BP Location: Left Arm, Patient Position: Sitting, Cuff Size: Large)   Pulse 78   Temp 98.1 F (36.7 C) (Oral)   Ht 5\' 11"  (1.803 m)   Wt 258 lb 8 oz (117.3 kg)   SpO2 96%   BMI 36.05 kg/m  General: Awake, appears stated age HEENT: MMM Heart: RRR, 3+ SEM, no LE edema, no bruits Lungs: CTAB, no rales, wheezes or rhonchi. No accessory muscle use Psych: Age appropriate judgment and insight, normal affect and mood  Assessment and Plan: Essential hypertension  Hypertriglyceridemia - Plan: Lipid panel  Neuropathy  Orders as above. Counseled on diet and exercise. Will see how things go with new med.  F/u in 6 mo for CPE, 6 weeks to recheck lipids. The patient voiced understanding and agreement to the plan.  [place], DO 12/25/18  9:19 AM

## 2018-12-25 NOTE — Patient Instructions (Addendum)
Call your insurance company to see if they prefer a different type of fibrate and insulin that would be cheaper for you.  Let us know if the new medication is too expensive.   Keep the diet clean and stay active.  I want your blood pressure less than 150 on the top and less than 90 on the bottom.   Let us know if you need anything.

## 2018-12-25 NOTE — Addendum Note (Signed)
Addended by: Sharon Seller B on: 12/25/2018 09:29 AM   Modules accepted: Orders

## 2019-01-08 DIAGNOSIS — R69 Illness, unspecified: Secondary | ICD-10-CM | POA: Diagnosis not present

## 2019-01-09 ENCOUNTER — Telehealth: Payer: Self-pay | Admitting: Family Medicine

## 2019-01-09 NOTE — Telephone Encounter (Signed)
Patient stated that Tommy Green is around 100 dollars, he would like to discuss coupon cards for moving forward or what he can do to make it more affordable. He states he is halfway through the bottle he has now.

## 2019-01-10 ENCOUNTER — Telehealth: Payer: Self-pay | Admitting: *Deleted

## 2019-01-10 NOTE — Telephone Encounter (Signed)
Called informed the patients wife of instructions. She verbalized understanding.

## 2019-01-10 NOTE — Telephone Encounter (Signed)
For now, have pt call [phone]. Our current payment cards have expired and we are not having luck with many reps coming to the office, given the COVID 19 situation. TY.

## 2019-01-10 NOTE — Telephone Encounter (Signed)
Received comprehensive chart notes from [place] via [facility]; forwarded to provider/SLS 03/25

## 2019-01-31 DIAGNOSIS — E876 Hypokalemia: Secondary | ICD-10-CM | POA: Diagnosis not present

## 2019-01-31 DIAGNOSIS — E1165 Type 2 diabetes mellitus with hyperglycemia: Secondary | ICD-10-CM | POA: Diagnosis not present

## 2019-01-31 DIAGNOSIS — E782 Mixed hyperlipidemia: Secondary | ICD-10-CM | POA: Diagnosis not present

## 2019-01-31 DIAGNOSIS — Z6837 Body mass index (BMI) 37.0-37.9, adult: Secondary | ICD-10-CM | POA: Diagnosis not present

## 2019-02-01 ENCOUNTER — Other Ambulatory Visit (INDEPENDENT_AMBULATORY_CARE_PROVIDER_SITE_OTHER): Payer: Medicare HMO

## 2019-02-01 ENCOUNTER — Other Ambulatory Visit: Payer: Self-pay

## 2019-02-01 DIAGNOSIS — E781 Pure hyperglyceridemia: Secondary | ICD-10-CM | POA: Diagnosis not present

## 2019-02-01 LAB — LIPID PANEL
Cholesterol: 163 mg/dL (ref 0–200)
HDL: 19.7 mg/dL — ABNORMAL LOW (ref >39.00)
Total CHOL/HDL Ratio: 8
Triglycerides: 651 mg/dL — ABNORMAL HIGH (ref 0.0–149.0)

## 2019-02-01 LAB — LDL CHOLESTEROL, DIRECT: Direct LDL: 48 mg/dL

## 2019-02-02 ENCOUNTER — Other Ambulatory Visit: Payer: Self-pay | Admitting: Family Medicine

## 2019-02-02 NOTE — Progress Notes (Signed)
ALA removed 2/2 cost.

## 2019-02-05 ENCOUNTER — Telehealth: Payer: Self-pay

## 2019-02-05 MED ORDER — OMEGA-3-ACID ETHYL ESTERS 1 G PO CAPS: 2.0000 g | ORAL_CAPSULE | Freq: Two times a day (BID) | ORAL | 1 refills | Status: DC

## 2019-02-05 NOTE — Telephone Encounter (Signed)
I sent in an alternative to Vascepa. Don't fill if too expensive. Ty.

## 2019-02-05 NOTE — Addendum Note (Signed)
Addended by: Ames Coupe on: 02/05/2019 02:58 PM   Modules accepted: Orders

## 2019-02-05 NOTE — Telephone Encounter (Signed)
A 30 d supply is 7.76 at [facility]. Shall I send it there? They would need to use GoodRx.

## 2019-02-05 NOTE — Telephone Encounter (Signed)
They cannot use the goodrx because he is on medicare

## 2019-02-05 NOTE — Telephone Encounter (Signed)
Called informed the patient of PCP instructions and she will let  us know.

## 2019-02-05 NOTE — Telephone Encounter (Signed)
Patient's wife returning call, she states that pharmacy advised her that they will not take goodrx card, as patient has medicare.

## 2019-02-05 NOTE — Telephone Encounter (Signed)
Thanks will let PCP know See results note

## 2019-02-05 NOTE — Telephone Encounter (Signed)
Goodrx is something that takes a medication and assumes the patient has no insurance to my knowledge. He would just have to ask to not run through insurance and use coupon, how much would it be. Can we call Tommy Green and verify this? Ty.

## 2019-02-05 NOTE — Telephone Encounter (Signed)
Patients wife called to say  the medication "Zetia" will cost them $415.00 and they cannot afford that at t his time. Please try a different medication if possible.

## 2019-02-05 NOTE — Telephone Encounter (Signed)
Patient's wife informed

## 2019-02-12 ENCOUNTER — Other Ambulatory Visit: Payer: Self-pay | Admitting: Family Medicine

## 2019-02-21 ENCOUNTER — Telehealth: Payer: Self-pay | Admitting: Family Medicine

## 2019-02-21 ENCOUNTER — Other Ambulatory Visit: Payer: Self-pay | Admitting: Family Medicine

## 2019-02-21 DIAGNOSIS — R69 Illness, unspecified: Secondary | ICD-10-CM | POA: Diagnosis not present

## 2019-02-21 MED ORDER — GABAPENTIN 300 MG PO CAPS: ORAL_CAPSULE | ORAL | 0 refills | Status: DC

## 2019-02-21 NOTE — Telephone Encounter (Signed)
As he was on 1 tab of 600 mg TID, let's do 2 tabs of the 300 mg cap TID. TY.

## 2019-02-21 NOTE — Telephone Encounter (Signed)
Please advise 

## 2019-02-21 NOTE — Telephone Encounter (Signed)
That's fine. Ty.  

## 2019-02-21 NOTE — Telephone Encounter (Signed)
done

## 2019-02-21 NOTE — Telephone Encounter (Signed)
Copied from [place] [id number] [id number]. Topic: Quick Communication - Rx Refill/Question >> Feb 21, 2019 12:55 PM Robina Ade, Helene Kelp D wrote: Medication: gabapentin (NEURONTIN) 600 MG tablet  Has the patient contacted their pharmacy? Yes, pharmacy would like to let the pcp know that 600 mg of medication is back order but they do have the 300 mg available. Please call Melani back, thanks. (Agent: If no, request that the patient contact the pharmacy for the refill.) (Agent: If yes, when and what did the pharmacy advise?)  Preferred Pharmacy (with phone number or street name): [facility] [place], [place] [facility]  Agent: Please be advised that RX refills may take up to 3 business days. We ask that you follow-up with your pharmacy.

## 2019-02-21 NOTE — Telephone Encounter (Signed)
How many per day should he take of the 300 mg?

## 2019-02-26 DIAGNOSIS — H524 Presbyopia: Secondary | ICD-10-CM | POA: Diagnosis not present

## 2019-02-26 DIAGNOSIS — H5213 Myopia, bilateral: Secondary | ICD-10-CM | POA: Diagnosis not present

## 2019-02-26 DIAGNOSIS — E119 Type 2 diabetes mellitus without complications: Secondary | ICD-10-CM | POA: Diagnosis not present

## 2019-02-26 DIAGNOSIS — H2513 Age-related nuclear cataract, bilateral: Secondary | ICD-10-CM | POA: Diagnosis not present

## 2019-02-26 DIAGNOSIS — Z7984 Long term (current) use of oral hypoglycemic drugs: Secondary | ICD-10-CM | POA: Diagnosis not present

## 2019-03-20 ENCOUNTER — Other Ambulatory Visit: Payer: Self-pay | Admitting: Family Medicine

## 2019-03-20 DIAGNOSIS — R69 Illness, unspecified: Secondary | ICD-10-CM | POA: Diagnosis not present

## 2019-04-23 ENCOUNTER — Telehealth: Payer: Self-pay | Admitting: Family Medicine

## 2019-04-23 NOTE — Telephone Encounter (Signed)
Copied from [place] [id number]. Topic: Quick Communication - See Telephone Encounter >> Apr 23, 2019  3:06 PM Loma Boston wrote: CRM for notification. See Telephone encounter for: 04/23/19.  To Dr Irene Limbo nurse  DR Leeroy Bock has prescribed his Metformin so Dr Nani Ravens does not need worry about.   Called the patient and he stated the Metformin has been recalled. His Endo he sees has prescribed this for him so PCP does not need to do.

## 2019-06-01 DIAGNOSIS — E876 Hypokalemia: Secondary | ICD-10-CM | POA: Diagnosis not present

## 2019-06-01 DIAGNOSIS — E782 Mixed hyperlipidemia: Secondary | ICD-10-CM | POA: Diagnosis not present

## 2019-06-01 DIAGNOSIS — E119 Type 2 diabetes mellitus without complications: Secondary | ICD-10-CM | POA: Diagnosis not present

## 2019-06-01 DIAGNOSIS — N183 Chronic kidney disease, stage 3 (moderate): Secondary | ICD-10-CM | POA: Diagnosis not present

## 2019-06-01 DIAGNOSIS — E1140 Type 2 diabetes mellitus with diabetic neuropathy, unspecified: Secondary | ICD-10-CM | POA: Diagnosis not present

## 2019-06-15 DIAGNOSIS — R69 Illness, unspecified: Secondary | ICD-10-CM | POA: Diagnosis not present

## 2019-06-19 DIAGNOSIS — R69 Illness, unspecified: Secondary | ICD-10-CM | POA: Diagnosis not present

## 2019-07-06 DIAGNOSIS — R69 Illness, unspecified: Secondary | ICD-10-CM | POA: Diagnosis not present

## 2019-08-04 ENCOUNTER — Other Ambulatory Visit: Payer: Self-pay | Admitting: Family Medicine

## 2019-08-06 ENCOUNTER — Encounter: Payer: Self-pay | Admitting: Family Medicine

## 2019-08-06 ENCOUNTER — Other Ambulatory Visit: Payer: Self-pay

## 2019-08-06 ENCOUNTER — Ambulatory Visit (INDEPENDENT_AMBULATORY_CARE_PROVIDER_SITE_OTHER): Payer: Medicare HMO | Admitting: Family Medicine

## 2019-08-06 VITALS — BP 132/72 | HR 77 | Temp 95.4°F | Ht 71.0 in | Wt 258.5 lb

## 2019-08-06 DIAGNOSIS — I10 Essential (primary) hypertension: Secondary | ICD-10-CM | POA: Diagnosis not present

## 2019-08-06 DIAGNOSIS — Z794 Long term (current) use of insulin: Secondary | ICD-10-CM | POA: Diagnosis not present

## 2019-08-06 DIAGNOSIS — R1115 Cyclical vomiting syndrome unrelated to migraine: Secondary | ICD-10-CM | POA: Insufficient documentation

## 2019-08-06 DIAGNOSIS — E1140 Type 2 diabetes mellitus with diabetic neuropathy, unspecified: Secondary | ICD-10-CM

## 2019-08-06 LAB — MICROALBUMIN / CREATININE URINE RATIO
Creatinine,U: 120.6 mg/dL
Microalb Creat Ratio: 0.9 mg/g (ref 0.0–30.0)
Microalb, Ur: 1.1 mg/dL (ref 0.0–1.9)

## 2019-08-06 LAB — CBC
HCT: 33.8 % — ABNORMAL LOW (ref 39.0–52.0)
Hemoglobin: 10.9 g/dL — ABNORMAL LOW (ref 13.0–17.0)
MCHC: 32.4 g/dL (ref 30.0–36.0)
MCV: 86.7 fl (ref 78.0–100.0)
Platelets: 238 10*3/uL (ref 150.0–400.0)
RBC: 3.89 Mil/uL — ABNORMAL LOW (ref 4.22–5.81)
RDW: 15.1 % (ref 11.5–15.5)
WBC: 9.5 10*3/uL (ref 4.0–10.5)

## 2019-08-06 LAB — COMPREHENSIVE METABOLIC PANEL
ALT: 14 U/L (ref 0–53)
AST: 16 U/L (ref 0–37)
Albumin: 4 g/dL (ref 3.5–5.2)
Alkaline Phosphatase: 52 U/L (ref 39–117)
BUN: 38 mg/dL — ABNORMAL HIGH (ref 6–23)
CO2: 24 mEq/L (ref 19–32)
Calcium: 9.2 mg/dL (ref 8.4–10.5)
Chloride: 104 mEq/L (ref 96–112)
Creatinine, Ser: 1.63 mg/dL — ABNORMAL HIGH (ref 0.40–1.50)
GFR: 41.39 mL/min — ABNORMAL LOW (ref >60.00)
Glucose, Bld: 167 mg/dL — ABNORMAL HIGH (ref 70–99)
Potassium: 4.9 mEq/L (ref 3.5–5.1)
Sodium: 138 mEq/L (ref 135–145)
Total Bilirubin: 0.3 mg/dL (ref 0.2–1.2)
Total Protein: 6.2 g/dL (ref 6.0–8.3)

## 2019-08-06 LAB — LIPID PANEL
Cholesterol: 151 mg/dL (ref 0–200)
HDL: 17.4 mg/dL — ABNORMAL LOW (ref >39.00)
Total CHOL/HDL Ratio: 9
Triglycerides: 846 mg/dL — ABNORMAL HIGH (ref 0.0–149.0)

## 2019-08-06 LAB — HEMOGLOBIN A1C: Hgb A1c MFr Bld: 8.1 % — ABNORMAL HIGH (ref 4.6–6.5)

## 2019-08-06 LAB — LDL CHOLESTEROL, DIRECT: Direct LDL: 41 mg/dL

## 2019-08-06 MED ORDER — ONDANSETRON 4 MG PO TBDP: 4.0000 mg | ORAL_TABLET | Freq: Three times a day (TID) | ORAL | 0 refills | Status: DC | PRN

## 2019-08-06 NOTE — Progress Notes (Signed)
Subjective:   Chief Complaint  Patient presents with  . Follow-up    Tommy Green is a 73 y.o. male here for follow-up of diabetes.   Webb's self monitored glucose range is low 100's.  Patient denies hypoglycemic reactions. He checks his glucose levels 4 times per day. Patient does require insulin.   Medications include: Metformin Diet is fair Exercise: walking  Hypertension Patient presents for hypertension follow up. He does monitor home blood pressures. Blood pressures ranging on average from 130's/70's. He is compliant with medications- Coreg 12.5 mg bid, lisinopril 40 mg/d, HCTZ 25 mg/d. Patient has these side effects of medication: none He is adhering to a healthy diet overall. Exercise: walking  Patient has a history of intermittent vomiting.  He was worked up by the GI team in the past and nothing was found.  He has been just dealing with it.  The other day, he had an episode and a friend brought over some Zofran.  It helps considerably.  He is requesting a rx of his own.  Past Medical History:  Diagnosis Date  . Anemia   . Essential hypertension 07/18/2017  . History of rheumatic fever   . History of stroke 07/18/2017  . Hypertriglyceridemia 07/18/2017  . Type 2 diabetes mellitus with diabetic neuropathy, unspecified ([place]) 07/18/2017     Related testing: Date of retinal exam: Done Pneumovax: done Flu Shot: Declines  Review of Systems: Pulmonary:  No SOB Cardiovascular:  No chest pain  Objective:  BP 132/72 (BP Location: Left Arm, Patient Position: Sitting, Cuff Size: Large)   Pulse 77   Temp (!) 95.4 F (35.2 C) (Temporal)   Ht 5\' 11"  (1.803 m)   Wt 258 lb 8 oz (117.3 kg)   SpO2 93%   BMI 36.05 kg/m  General:  Well developed, well nourished, in no apparent distress Skin:  Warm, no pallor or diaphoresis Head:  Normocephalic, atraumatic Eyes:  Pupils equal and round, sclera anicteric without injection  Lungs:  CTAB, no access msc use Cardio:   RRR, no bruits, no LE edema Musculoskeletal:  Symmetrical muscle groups noted without atrophy or deformity Psych: Age appropriate judgment and insight  Assessment:   Type 2 diabetes mellitus with diabetic neuropathy, with long-term current use of insulin (HCC) - Plan: Lipid panel, Hemoglobin A1c, Microalbumin / creatinine urine ratio, Comprehensive metabolic panel, CBC  Cyclic vomiting syndrome - Plan: ondansetron (ZOFRAN-ODT) 4 MG disintegrating tablet  Essential hypertension   Plan:   Orders as above. Counseled on diet and exercise. F/u in 6 mo. The patient voiced understanding and agreement to the plan.  [place], DO 08/06/19 10:59 AM

## 2019-08-06 NOTE — Patient Instructions (Signed)
Give [place] 2-3 business days to get the results of your labs back.   Keep the diet clean and stay active.  Let me know if you change your mind about the flu shot.   Let us know if you need anything.

## 2019-08-07 ENCOUNTER — Other Ambulatory Visit: Payer: Self-pay | Admitting: Family Medicine

## 2019-08-07 DIAGNOSIS — R69 Illness, unspecified: Secondary | ICD-10-CM | POA: Diagnosis not present

## 2019-08-07 DIAGNOSIS — N289 Disorder of kidney and ureter, unspecified: Secondary | ICD-10-CM

## 2019-08-09 ENCOUNTER — Telehealth: Payer: Self-pay | Admitting: *Deleted

## 2019-08-09 NOTE — Telephone Encounter (Signed)
Notes recorded by Ewing, [name] B, CMA on 08/07/2019 at 4:25 PM EDT  Called informed the patient of results/instructions.  Scheduled lab appt/ put in the order.  The patient stated he just cannot take a statin at all due to them causing cramps.   ------   Notes recorded by Ewing, [name] B, CMA on 08/07/2019 at 2:24 PM EDT  Called left message to call back  ------   Notes recorded by Ewing, [name] B, CMA on 08/07/2019 at 10:05 AM EDT  Called left message to call back,  ------

## 2019-08-09 NOTE — Telephone Encounter (Signed)
[name], DO  Ewing, Donell Sievert, CMA        Is he in a better spot to see the lipid clinic at this point?

## 2019-08-09 NOTE — Telephone Encounter (Signed)
Patient stated that he went 5 years ago and they told him stuff that he was already doing.  He is unable to afford it at this time.  He will try either Vascepa or Lovaza if can get a lower price.  I will look into this for him and get back with him.  He also stated that the fenofibrate is expensive as well, found coupon on good rx that is cheaper than insurance that he can use at local pharmacy.  He will let us know when next refill is and call me.

## 2019-08-09 NOTE — Telephone Encounter (Signed)
-----   Message from [name], DO sent at 08/08/2019  7:02 AM EDT ----- Is he in a better spot to see the lipid clinic at this point? ----- Message ----- From: Jamesetta Orleans, CMA Sent: 08/07/2019   4:25 PM EDT To: [name], DO  Called informed the patient of results/instructions. Scheduled lab appt/ put in the order. The patient stated he just cannot take a statin at all due to them causing cramps.

## 2019-08-09 NOTE — Telephone Encounter (Signed)
Noted. Yes, let's reach out to our reps and see if there are any other options. Also, please have pt notify us if anything changes and we are able to refer him to the lipid clinic. Much has/can change in 5 years. Ty.

## 2019-08-10 NOTE — Telephone Encounter (Signed)
Pt is returning Tommy Green call.   [name] please call pt back when available.

## 2019-08-10 NOTE — Telephone Encounter (Signed)
Called left message to call back 

## 2019-08-10 NOTE — Telephone Encounter (Signed)
Advised patient of message.

## 2019-08-13 NOTE — Telephone Encounter (Signed)
Spoke with wife and they will call insurance to see if they can do a tier reduction and what they have to do to do it.  I also called the vascepa sales rep to see if they had any programs for folks to help with cost that are on medicare.  Left message for him to call back.  Mrs. Stys stated that they will work on [employer] and get back with me on tomorrow.

## 2019-08-21 ENCOUNTER — Other Ambulatory Visit (INDEPENDENT_AMBULATORY_CARE_PROVIDER_SITE_OTHER): Payer: Medicare HMO

## 2019-08-21 ENCOUNTER — Other Ambulatory Visit: Payer: Self-pay

## 2019-08-21 DIAGNOSIS — N289 Disorder of kidney and ureter, unspecified: Secondary | ICD-10-CM

## 2019-08-21 LAB — COMPREHENSIVE METABOLIC PANEL
ALT: 12 U/L (ref 0–53)
AST: 11 U/L (ref 0–37)
Albumin: 4 g/dL (ref 3.5–5.2)
Alkaline Phosphatase: 60 U/L (ref 39–117)
BUN: 35 mg/dL — ABNORMAL HIGH (ref 6–23)
CO2: 22 mEq/L (ref 19–32)
Calcium: 9.1 mg/dL (ref 8.4–10.5)
Chloride: 107 mEq/L (ref 96–112)
Creatinine, Ser: 1.39 mg/dL (ref 0.40–1.50)
GFR: 49.74 mL/min — ABNORMAL LOW (ref >60.00)
Glucose, Bld: 226 mg/dL — ABNORMAL HIGH (ref 70–99)
Potassium: 4.8 mEq/L (ref 3.5–5.1)
Sodium: 138 mEq/L (ref 135–145)
Total Bilirubin: 0.2 mg/dL (ref 0.2–1.2)
Total Protein: 6.4 g/dL (ref 6.0–8.3)

## 2019-08-24 NOTE — Telephone Encounter (Signed)
Called to follow up with patient and advised that I was unable to find any assistant program.  He stated that his endocrinologist has been trying to find some as well and was unable to as well.  He did call the local agent for his insurance company and they advised him that those two particular medications may be covered better and that they will get a formulary book out of what is covered.

## 2019-09-17 NOTE — Progress Notes (Signed)
Virtual Visit via Video Note  I connected with patient on 09/18/19 at 10:15 AM EST by audio enabled telemedicine application and verified that I am speaking with the correct person using two identifiers.   THIS ENCOUNTER IS A VIRTUAL VISIT DUE TO COVID-19 - PATIENT WAS NOT SEEN IN THE OFFICE. PATIENT HAS CONSENTED TO VIRTUAL VISIT / TELEMEDICINE VISIT   Location of patient: home  Location of provider: office  I discussed the limitations of evaluation and management by telemedicine and the availability of in person appointments. The patient expressed understanding and agreed to proceed.   Subjective:   Tommy Green is a 73 y.o. male who presents for Medicare Annual/Subsequent preventive examination.  Review of Systems:  Home Safety/Smoke Alarms: Feels safe in home. Smoke alarms in place.  Lives with wife. 1 story home. Uses cane on occasion. Has 2 sons that call almost every day.  Male:   CCS- pt reports last done [facility] 2019.    PSA- No results found for: PSA      Objective:    Vitals: BP 140/72 Comment: pt reported  There is no height or weight on file to calculate BMI.  Advanced Directives 09/18/2019 01/05/2018  Does Patient Have a Medical Advance Directive? No Yes  Type of Advance Directive - [place];Living will  Copy of [place] in Chart? - No - copy requested  Would patient like information on creating a medical advance directive? No - Patient declined -    Tobacco Social History   Tobacco Use  Smoking Status Former Smoker  . Types: Cigarettes  Smokeless Tobacco Never Used  Tobacco Comment   Quit in 1973     Counseling given: Not Answered Comment: Quit in 1973   Clinical Intake: Pain : No/denies pain     Past Medical History:  Diagnosis Date  . Anemia   . Essential hypertension 07/18/2017  . History of rheumatic fever   . History of stroke 07/18/2017  . Hypertriglyceridemia 07/18/2017  . Type 2  diabetes mellitus with diabetic neuropathy, unspecified ([place]) 07/18/2017   Past Surgical History:  Procedure Laterality Date  . FOOT CAPSULOTOMY Left 07/25/2008   Mid Foot #2 MPJ  . Hammertoe Repair Left 07/25/2008   #2 toe  . SPINAL FUSION    . TARSAL TUNNEL RELEASE Left 07/25/2008   Family History  Problem Relation Age of Onset  . Hyperlipidemia Mother   . Hyperlipidemia Father   . Cancer Neg Hx    Social History   Socioeconomic History  . Marital status: Married    Spouse name: Not on file  . Number of children: Not on file  . Years of education: Not on file  . Highest education level: Not on file  Occupational History  . Not on file  Social Needs  . Financial resource strain: Not on file  . Food insecurity    Worry: Not on file    Inability: Not on file  . Transportation needs    Medical: Not on file    Non-medical: Not on file  Tobacco Use  . Smoking status: Former Smoker    Types: Cigarettes  . Smokeless tobacco: Never Used  . Tobacco comment: Quit in 1973  Substance and Sexual Activity  . Alcohol use: No  . Drug use: No  . Sexual activity: Never  Lifestyle  . Physical activity    Days per week: Not on file    Minutes per session: Not on  file  . Stress: Not on file  Relationships  . Social Herbalist on phone: Not on file    Gets together: Not on file    Attends religious service: Not on file    Active member of club or organization: Not on file    Attends meetings of clubs or organizations: Not on file    Relationship status: Not on file  Other Topics Concern  . Not on file  Social History Narrative  . Not on file    Outpatient Encounter Medications as of 09/18/2019  Medication Sig  . carvedilol (COREG) 12.5 MG tablet TAKE 1 TABLET TWICE DAILY  WITH MEALS  . clobetasol cream (TEMOVATE) [id number] % Apply 1 application topically 2 (two) times daily.  . DULoxetine (CYMBALTA) 30 MG capsule TAKE 1 CAPSULE DAILY  . fenofibrate micronized (LOFIBRA)  200 MG capsule TAKE 1 CAPSULE DAILY BEFOREBREAKFAST  . gabapentin (NEURONTIN) 300 MG capsule Take 2 capsules 3 times daily  . gabapentin (NEURONTIN) 600 MG tablet TAKE 1 TABLET 3 TIMES A DAY  . hydrochlorothiazide (HYDRODIURIL) 25 MG tablet TAKE 1 TABLET DAILY  . Insulin Aspart (NOVOLOG [place]) Sliding scale  . Insulin Detemir (LEVEMIR FLEXPEN) 100 UNIT/ML Pen Inject 60 Units daily at 10 pm into the skin.  Marland Kitchen lisinopril (ZESTRIL) 40 MG tablet TAKE 1 TABLET DAILY  . metFORMIN (GLUCOPHAGE) 500 MG tablet Take 500 mg by mouth 2 (two) times daily with a meal.  . Multiple Vitamins-Minerals (MULTIVITAMIN WITH MINERALS) tablet Take 1 tablet by mouth daily.  . ondansetron (ZOFRAN-ODT) 4 MG disintegrating tablet Take 1 tablet (4 mg total) by mouth every 8 (eight) hours as needed for nausea or vomiting.  . vitamin B-12 (CYANOCOBALAMIN) 1000 MCG tablet Take 1,000 mcg by mouth daily.  . diclofenac (VOLTAREN) 75 MG EC tablet TAKE 1 TABLET BY MOUTH TWICE A DAY (Patient not taking: Reported on 09/18/2019)  . omega-3 acid ethyl esters (LOVAZA) 1 g capsule Take 2 capsules (2 g total) by mouth 2 (two) times daily. (Patient not taking: Reported on 09/18/2019)   No facility-administered encounter medications on file as of 09/18/2019.     Activities of Daily Living In your present state of health, do you have any difficulty performing the following activities: 09/18/2019  Hearing? N  Vision? N  Difficulty concentrating or making decisions? N  Walking or climbing stairs? N  Dressing or bathing? N  Doing errands, shopping? N  Preparing Food and eating ? N  Using the Toilet? N  In the past six months, have you accidently leaked urine? N  Do you have problems with loss of bowel control? N  Managing your Medications? N  Managing your Finances? N  Housekeeping or managing your Housekeeping? N  Some recent data might be hidden    Patient Care Team: [name], DO as PCP - General (Family Medicine) Alanson Aly, MD as Referring Physician (Internal Medicine) Jimmie Molly, OD (Optometry)   Assessment:   This is a routine wellness examination for [place]. Physical assessment deferred to PCP.  Exercise Activities and Dietary recommendations Current Exercise Habits: The patient does not participate in regular exercise at present, Exercise limited by: neurologic condition(s)(neuropathy) Diet (meal preparation, eat out, water intake, caffeinated beverages, dairy products, fruits and vegetables): in general, a "healthy" diet  , well balanced   Goals    . maintain normal A1C       Fall Risk Fall Risk  09/18/2019 01/05/2018  Falls in the  past year? 1 No  Number falls in past yr: 0 -  Injury with Fall? 0 -  Follow up Education provided;Falls prevention discussed -   Depression Screen PHQ 2/9 Scores 01/05/2018  PHQ - 2 Score 0    Cognitive Function Ad8 score reviewed for issues:  Issues making decisions:no  Less interest in hobbies / activities:no  Repeats questions, stories (family complaining):no  Trouble using ordinary gadgets (microwave, computer, phone):no  Forgets the month or year: no  Mismanaging finances: no  Remembering appts:no  Daily problems with thinking and/or memory:no Ad8 score is=0     MMSE - Mini Mental State Exam 01/05/2018  Orientation to time 5  Orientation to Place 5  Registration 3  Attention/ Calculation 2  Recall 2  Language- name 2 objects 2  Language- repeat 1  Language- follow 3 step command 3  Language- read & follow direction 1  Write a sentence 1  Copy design 1  Total score 26        Immunization History  Administered Date(s) Administered  . Td 12/25/2018    Screening Tests Health Maintenance  Topic Date Due  . INFLUENZA VACCINE  01/16/2020 (Originally 05/19/2019)  . COLONOSCOPY  12/17/2026  . TETANUS/TDAP  12/24/2028  . Hepatitis C Screening  Completed  . PNA vac Low Risk Adult  Completed  . FOOT EXAM  Discontinued  .  HEMOGLOBIN A1C  Discontinued  . OPHTHALMOLOGY EXAM  Discontinued     Plan:   See you next year!  Continue to eat heart healthy diet (full of fruits, vegetables, whole grains, lean protein, water--limit salt, fat, and sugar intake) and increase physical activity as tolerated.  Continue doing brain stimulating activities (puzzles, reading, adult coloring books, staying active) to keep memory sharp.   Bring a copy of your living will and/or healthcare power of attorney to your next office visit.    I have personally reviewed and noted the following in the patient's chart:   . Medical and social history . Use of alcohol, tobacco or illicit drugs  . Current medications and supplements . Functional ability and status . Nutritional status . Physical activity . Advanced directives . List of other physicians . Hospitalizations, surgeries, and ER visits in previous 12 months . Vitals . Screenings to include cognitive, depression, and falls . Referrals and appointments  In addition, I have reviewed and discussed with patient certain preventive protocols, quality metrics, and best practice recommendations. A written personalized care plan for preventive services as well as general preventive health recommendations were provided to patient.     Shela Nevin, [place]  09/18/2019

## 2019-09-18 ENCOUNTER — Encounter: Payer: Self-pay | Admitting: *Deleted

## 2019-09-18 ENCOUNTER — Other Ambulatory Visit: Payer: Self-pay

## 2019-09-18 ENCOUNTER — Ambulatory Visit (INDEPENDENT_AMBULATORY_CARE_PROVIDER_SITE_OTHER): Payer: Medicare HMO | Admitting: *Deleted

## 2019-09-18 VITALS — BP 140/72

## 2019-09-18 DIAGNOSIS — Z0000 Encounter for general adult medical examination without abnormal findings: Secondary | ICD-10-CM | POA: Diagnosis not present

## 2019-09-18 NOTE — Patient Instructions (Addendum)
See you next year!  Continue to eat heart healthy diet (full of fruits, vegetables, whole grains, lean protein, water--limit salt, fat, and sugar intake) and increase physical activity as tolerated.  Continue doing brain stimulating activities (puzzles, reading, adult coloring books, staying active) to keep memory sharp.   Bring a copy of your living will and/or healthcare power of attorney to your next office visit.   Tommy Green , Thank you for taking time to come for your Medicare Wellness Visit. I appreciate your ongoing commitment to your health goals. Please review the following plan we discussed and let me know if I can assist you in the future.   These are the goals we discussed: Goals    . maintain normal A1C       This is a list of the screening recommended for you and due dates:  Health Maintenance  Topic Date Due  . Flu Shot  01/16/2020*  . Colon Cancer Screening  12/17/2026  . Tetanus Vaccine  12/24/2028  .  Hepatitis C: One time screening is recommended by Center for Disease Control  (CDC) for  adults born from [birth year] through 1967.   Completed  . Pneumonia vaccines  Completed  . Complete foot exam   Discontinued  . Hemoglobin A1C  Discontinued  . Eye exam for diabetics  Discontinued  *Topic was postponed. The date shown is not the original due date.    Preventive Care 75 Years and Older, Male Preventive care refers to lifestyle choices and visits with your health care provider that can promote health and wellness. This includes:  A yearly physical exam. This is also called an annual well check.  Regular dental and eye exams.  Immunizations.  Screening for certain conditions.  Healthy lifestyle choices, such as diet and exercise. What can I expect for my preventive care visit? Physical exam Your health care provider will check:  Height and weight. These may be used to calculate body mass index (BMI), which is a measurement that tells if you are at a healthy  weight.  Heart rate and blood pressure.  Your skin for abnormal spots. Counseling Your health care provider may ask you questions about:  Alcohol, tobacco, and drug use.  Emotional well-being.  Home and relationship well-being.  Sexual activity.  Eating habits.  History of falls.  Memory and ability to understand (cognition).  Work and work Statistician. What immunizations do I need?  Influenza (flu) vaccine  This is recommended every year. Tetanus, diphtheria, and pertussis (Tdap) vaccine  You may need a Td booster every 10 years. Varicella (chickenpox) vaccine  You may need this vaccine if you have not already been vaccinated. Zoster (shingles) vaccine  You may need this after age 75. Pneumococcal conjugate (PCV13) vaccine  One dose is recommended after age 18. Pneumococcal polysaccharide (PPSV23) vaccine  One dose is recommended after age 60. Measles, mumps, and rubella (MMR) vaccine  You may need at least one dose of MMR if you were born in 1957 or later. You may also need a second dose. Meningococcal conjugate (MenACWY) vaccine  You may need this if you have certain conditions. Hepatitis A vaccine  You may need this if you have certain conditions or if you travel or work in places where you may be exposed to hepatitis A. Hepatitis B vaccine  You may need this if you have certain conditions or if you travel or work in places where you may be exposed to hepatitis B. Haemophilus influenzae type b (  Hib) vaccine  You may need this if you have certain conditions. You may receive vaccines as individual doses or as more than one vaccine together in one shot (combination vaccines). Talk with your health care provider about the risks and benefits of combination vaccines. What tests do I need? Blood tests  Lipid and cholesterol levels. These may be checked every 5 years, or more frequently depending on your overall health.  Hepatitis C test.  Hepatitis B  test. Screening  Lung cancer screening. You may have this screening every year starting at age 75 if you have a 30-pack-year history of smoking and currently smoke or have quit within the past 15 years.  Colorectal cancer screening. All adults should have this screening starting at age 75 and continuing until age 57. Your health care provider may recommend screening at age 58 if you are at increased risk. You will have tests every 1-10 years, depending on your results and the type of screening test.  Prostate cancer screening. Recommendations will vary depending on your family history and other risks.  Diabetes screening. This is done by checking your blood sugar (glucose) after you have not eaten for a while (fasting). You may have this done every 1-3 years.  Abdominal aortic aneurysm (AAA) screening. You may need this if you are a current or former smoker.  Sexually transmitted disease (STD) testing. Follow these instructions at home: Eating and drinking  Eat a diet that includes fresh fruits and vegetables, whole grains, lean protein, and low-fat dairy products. Limit your intake of foods with high amounts of sugar, saturated fats, and salt.  Take vitamin and mineral supplements as recommended by your health care provider.  Do not drink alcohol if your health care provider tells you not to drink.  If you drink alcohol: ? Limit how much you have to 0-2 drinks a day. ? Be aware of how much alcohol is in your drink. In the [place] one drink equals one 12 oz bottle of beer (355 mL), one 5 oz glass of wine (148 mL), or one 1 oz glass of hard liquor (44 mL). Lifestyle  Take daily care of your teeth and gums.  Stay active. Exercise for at least 30 minutes on 5 or more days each week.  Do not use any products that contain nicotine or tobacco, such as cigarettes, e-cigarettes, and chewing tobacco. If you need help quitting, ask your health care provider.  If you are sexually active,  practice safe sex. Use a condom or other form of protection to prevent STIs (sexually transmitted infections).  Talk with your health care provider about taking a low-dose aspirin or statin. What's next?  Visit your health care provider once a year for a well check visit.  Ask your health care provider how often you should have your eyes and teeth checked.  Stay up to date on all vaccines. This information is not intended to replace advice given to you by your health care provider. Make sure you discuss any questions you have with your health care provider. Document Released: 10/31/2015 Document Revised: 09/28/2018 Document Reviewed: 09/28/2018 Elsevier Patient Education  2020 [employer].

## 2019-09-20 ENCOUNTER — Emergency Department (HOSPITAL_BASED_OUTPATIENT_CLINIC_OR_DEPARTMENT_OTHER)
Admission: EM | Admit: 2019-09-20 | Discharge: 2019-09-20 | Disposition: A | Discharge status: Home / Self Care | Payer: Medicare HMO | Attending: Emergency Medicine | Admitting: Emergency Medicine

## 2019-09-20 ENCOUNTER — Other Ambulatory Visit: Payer: Self-pay

## 2019-09-20 ENCOUNTER — Encounter (HOSPITAL_BASED_OUTPATIENT_CLINIC_OR_DEPARTMENT_OTHER): Payer: Self-pay | Admitting: *Deleted

## 2019-09-20 DIAGNOSIS — Z887 Allergy status to serum and vaccine status: Secondary | ICD-10-CM | POA: Diagnosis not present

## 2019-09-20 DIAGNOSIS — Z888 Allergy status to other drugs, medicaments and biological substances status: Secondary | ICD-10-CM | POA: Insufficient documentation

## 2019-09-20 DIAGNOSIS — M79641 Pain in right hand: Secondary | ICD-10-CM | POA: Diagnosis present

## 2019-09-20 DIAGNOSIS — M13141 Monoarthritis, not elsewhere classified, right hand: Secondary | ICD-10-CM | POA: Insufficient documentation

## 2019-09-20 DIAGNOSIS — Z79899 Other long term (current) drug therapy: Secondary | ICD-10-CM | POA: Diagnosis not present

## 2019-09-20 DIAGNOSIS — M25521 Pain in right elbow: Secondary | ICD-10-CM | POA: Diagnosis not present

## 2019-09-20 DIAGNOSIS — Z87891 Personal history of nicotine dependence: Secondary | ICD-10-CM | POA: Diagnosis not present

## 2019-09-20 DIAGNOSIS — M25541 Pain in joints of right hand: Secondary | ICD-10-CM | POA: Diagnosis not present

## 2019-09-20 DIAGNOSIS — E781 Pure hyperglyceridemia: Secondary | ICD-10-CM | POA: Diagnosis not present

## 2019-09-20 DIAGNOSIS — M25531 Pain in right wrist: Secondary | ICD-10-CM | POA: Diagnosis not present

## 2019-09-20 DIAGNOSIS — E119 Type 2 diabetes mellitus without complications: Secondary | ICD-10-CM | POA: Insufficient documentation

## 2019-09-20 DIAGNOSIS — I10 Essential (primary) hypertension: Secondary | ICD-10-CM | POA: Insufficient documentation

## 2019-09-20 DIAGNOSIS — M2550 Pain in unspecified joint: Secondary | ICD-10-CM

## 2019-09-20 DIAGNOSIS — Z881 Allergy status to other antibiotic agents status: Secondary | ICD-10-CM | POA: Diagnosis not present

## 2019-09-20 DIAGNOSIS — Z886 Allergy status to analgesic agent status: Secondary | ICD-10-CM | POA: Diagnosis not present

## 2019-09-20 DIAGNOSIS — Z794 Long term (current) use of insulin: Secondary | ICD-10-CM | POA: Insufficient documentation

## 2019-09-20 MED ORDER — PREDNISONE 20 MG PO TABS: 20.0000 mg | ORAL_TABLET | Freq: Every day | ORAL | 0 refills | Status: AC

## 2019-09-20 MED ORDER — KETOROLAC TROMETHAMINE 30 MG/ML IJ SOLN
INTRAMUSCULAR | Status: AC
  Filled 2019-09-20: qty 1

## 2019-09-20 MED ORDER — KETOROLAC TROMETHAMINE 60 MG/2ML IM SOLN
30.0000 mg | Freq: Once | INTRAMUSCULAR | Status: AC
  Administered 2019-09-20: 08:00:00 30 mg via INTRAMUSCULAR

## 2019-09-20 MED FILL — predniSONE 20 MG TABS: 20 | 5 days supply | Qty: 5 | Fill #0

## 2019-09-20 NOTE — ED Provider Notes (Signed)
[facility] Provider Note   CSN: [id number] Arrival date & time: 09/20/19  [id number]     History   Chief Complaint Chief Complaint  Patient presents with   Arm Pain    HPI Tommy Green is a 73 y.o. male.     Patient with pain to the right arm.  States that pain has been ongoing for the last 2 days.  Seems to be worse than the right hand around the knuckles of his fingers.  Has difficulty with opening closing his fist due to pain.  Has some pain with range of motion at his wrist and elbow but not as bad as the hand.  Has history of joint issues in the past specifically the shoulder and knees.  Denies any trauma.  Took ibuprofen last night.  Denies any repetitive use.  Has history of diabetes.  Has been evaluated for possible rheumatoid arthritis in the past.  Denies any fevers or chills  The history is provided by the patient.  Illness Location:  Right arm Quality:  Pain Severity:  Mild Onset quality:  Gradual Timing:  Intermittent Progression:  Waxing and waning Chronicity:  New Associated symptoms: no abdominal pain, no chest pain, no cough, no ear pain, no fever, no rash, no shortness of breath, no sore throat and no vomiting     Past Medical History:  Diagnosis Date   Anemia    Essential hypertension 07/18/2017   History of rheumatic fever    History of stroke 07/18/2017   Hypertriglyceridemia 07/18/2017   Type 2 diabetes mellitus with diabetic neuropathy, unspecified ([place]) 07/18/2017    Patient Active Problem List   Diagnosis Date Noted   Cyclic vomiting syndrome 08/06/2019   Neuropathy 12/25/2018   Bilateral hip pain 08/24/2018   AK (actinic keratosis) 08/24/2018   Neoplasm of uncertain behavior 0000000   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   Tendinopathy of right gluteus medius 07/13/2018   Tendinopathy of left gluteus medius 07/13/2018   Squamous cell carcinoma in situ (SCCIS) of skin 03/24/2018   Skin lesion  03/17/2018   Essential hypertension 07/18/2017   Hyperlipidemia associated with type 2 diabetes mellitus ([place]) 07/18/2017   Type 2 diabetes mellitus with diabetic neuropathy, unspecified ([place]) 07/18/2017   Heart murmur 07/18/2017   Hypertriglyceridemia 07/18/2017   History of stroke 07/18/2017   Pain in joint, ankle and foot 02/06/2013    Past Surgical History:  Procedure Laterality Date   FOOT CAPSULOTOMY Left 07/25/2008   Mid Foot #2 MPJ   Hammertoe Repair Left 07/25/2008   #2 toe   SPINAL FUSION     TARSAL TUNNEL RELEASE Left 07/25/2008        Home Medications    Prior to Admission medications   Medication Sig Start Date End Date Taking? Authorizing Provider  carvedilol (COREG) 12.5 MG tablet TAKE 1 TABLET TWICE DAILY  WITH MEALS 02/21/19   Wendling, Crosby Oyster, DO  clobetasol cream (TEMOVATE) [id number] % Apply 1 application topically 2 (two) times daily. 07/13/18   [name], DO  diclofenac (VOLTAREN) 75 MG EC tablet TAKE 1 TABLET BY MOUTH TWICE A DAY Patient not taking: Reported on 09/18/2019 03/20/19   [name], DO  DULoxetine (CYMBALTA) 30 MG capsule TAKE 1 CAPSULE DAILY 11/20/18   [name], DO  fenofibrate micronized (LOFIBRA) 200 MG capsule TAKE 1 CAPSULE DAILY BEFOREBREAKFAST 02/21/19   [name], DO  gabapentin (NEURONTIN) 300 MG capsule Take 2 capsules 3 times daily 02/21/19  [name], DO  gabapentin (NEURONTIN) 600 MG tablet TAKE 1 TABLET 3 TIMES A DAY 08/06/19   [name], DO  hydrochlorothiazide (HYDRODIURIL) 25 MG tablet TAKE 1 TABLET DAILY 02/12/19   [name], DO  Insulin Aspart (NOVOLOG [place]) Sliding scale    [provider]  Insulin Detemir (LEVEMIR FLEXPEN) 100 UNIT/ML Pen Inject 60 Units daily at 10 pm into the skin.    [provider]  lisinopril (ZESTRIL) 40 MG tablet TAKE 1 TABLET DAILY 02/21/19   Wendling, Crosby Oyster, DO  metFORMIN  (GLUCOPHAGE) 500 MG tablet Take 500 mg by mouth 2 (two) times daily with a meal.    [provider]  Multiple Vitamins-Minerals (MULTIVITAMIN WITH MINERALS) tablet Take 1 tablet by mouth daily.    [provider]  omega-3 acid ethyl esters (LOVAZA) 1 g capsule Take 2 capsules (2 g total) by mouth 2 (two) times daily. Patient not taking: Reported on 09/18/2019 02/05/19   [name], DO  ondansetron (ZOFRAN-ODT) 4 MG disintegrating tablet Take 1 tablet (4 mg total) by mouth every 8 (eight) hours as needed for nausea or vomiting. 08/06/19   [name], DO  predniSONE (DELTASONE) 20 MG tablet Take 1 tablet (20 mg total) by mouth daily for 5 days. 09/20/19 09/25/19  Katalaya Beel, DO  vitamin B-12 (CYANOCOBALAMIN) 1000 MCG tablet Take 1,000 mcg by mouth daily.    [provider]    Family History Family History  Problem Relation Age of Onset   Hyperlipidemia Mother    Hyperlipidemia Father    Cancer Neg Hx     Social History Social History   Tobacco Use   Smoking status: Former Smoker    Types: Cigarettes   Smokeless tobacco: Never Used   Tobacco comment: Quit in 1973  Substance Use Topics   Alcohol use: No   Drug use: No     Allergies   Influenza vaccines, Aspirin, Atorvastatin, Canagliflozin, and Statins   Review of Systems Review of Systems  Constitutional: Negative for chills and fever.  HENT: Negative for ear pain and sore throat.   Eyes: Negative for pain and visual disturbance.  Respiratory: Negative for cough and shortness of breath.   Cardiovascular: Negative for chest pain and palpitations.  Gastrointestinal: Negative for abdominal pain and vomiting.  Genitourinary: Negative for dysuria and hematuria.  Musculoskeletal: Positive for arthralgias. Negative for back pain.  Skin: Negative for color change and rash.  Neurological: Negative for seizures, syncope and numbness.  All other systems reviewed and are  negative.    Physical Exam Updated Vital Signs BP (!) 147/81 (BP Location: Right Arm)    Pulse 88    Temp 98.1 F (36.7 C) (Oral)    Resp 16    Ht 5\' 11"  (1.803 m)    Wt 120.2 kg    SpO2 97%    BMI 36.96 kg/m   Physical Exam Vitals signs and nursing note reviewed.  Constitutional:      Appearance: He is well-developed.  HENT:     Head: Normocephalic and atraumatic.     Nose: Nose normal.     Mouth/Throat:     Mouth: Mucous membranes are moist.     Pharynx: Oropharynx is clear.  Eyes:     Extraocular Movements: Extraocular movements intact.     Conjunctiva/sclera: Conjunctivae normal.     Pupils: Pupils are equal, round, and reactive to light.  Neck:     Musculoskeletal: Normal range of  motion and neck supple. No muscular tenderness.  Cardiovascular:     Rate and Rhythm: Normal rate and regular rhythm.     Pulses: Normal pulses.     Heart sounds: Normal heart sounds. No murmur.  Pulmonary:     Effort: Pulmonary effort is normal. No respiratory distress.     Breath sounds: Normal breath sounds.  Abdominal:     Palpations: Abdomen is soft.     Tenderness: There is no abdominal tenderness.  Musculoskeletal:        General: No tenderness.     Comments: No specific tenderness to the right arm, pain with range of motion of fingers on the right hand, good range of motion at the right wrist and right elbow, no swelling of the joints, no erythema to the right upper extremity  Skin:    General: Skin is warm and dry.     Capillary Refill: Capillary refill takes less than 2 seconds.  Neurological:     General: No focal deficit present.     Mental Status: He is alert and oriented to person, place, and time.     Cranial Nerves: No cranial nerve deficit.     Sensory: No sensory deficit.     Motor: No weakness.     Coordination: Coordination normal.     Comments: Normal speech, normal sensation, 5+ out of 5 strength throughout except for difficult to assess in the right hand due to  pain at his MTPs      ED Treatments / Results  Labs (all labs ordered are listed, but only abnormal results are displayed) Labs Reviewed - No data to display  EKG None  Radiology No results found.  Procedures Procedures (including critical care time)  Medications Ordered in ED Medications  ketorolac (TORADOL) injection 30 mg (30 mg Intramuscular Given 09/20/19 0814)     Initial Impression / Assessment and Plan / ED Course  I have reviewed the triage vital signs and the nursing notes.  Pertinent labs & imaging results that were available during my care of the patient were reviewed by me and considered in my medical decision making (see chart for details).     Tommy Green is a 73 year old male with history of hypertension, diabetes who presents to the ED with joint pain in the right upper extremity.  Patient with normal vitals.  No fever.  Patient with pain in the joints for the last 2 days.  Worse with movement.  Has tried anti-inflammatories with minimal relief.  No history of trauma.  No obvious deformity on exam.  Pain in the right elbow, right wrist, right fingers.  No signs of septic joint.  There is no swelling of the joints or erythema of the joints.  Patient has good pulses in the right upper extremity.  Doubt arterial process.  Doubt DVT as there is no swelling of the arm.  Patient with minimal pain with range of motion of these joints but worse in the right hand including his MTPs.  Suspect arthritis process.  Recommend shot of Toradol while in the ED.  Has had history of some elevated creatinines in the past.  Does have diabetes as well.  Shared decision was made to trial steroids and have patient make adjustments to his insulin therapy at home.  Feel like this will be more manageable than potentially injuring his kidneys with anti-inflammatories.  We will have him follow-up closely with his primary care doctor.  Suspect arthritic process possibly osteoarthritis versus  rheumatoid arthritis.  Given return precautions and discharged in ED in good condition.  No concern for stroke as pain and issues are below the elbow only.  Does not have any neck pain.  No midline spinal tenderness.  Does not appear to have any neuropathic type pain and doubt nerve process in the spine.  Family understands return precautions.  This chart was dictated using voice recognition software.  Despite best efforts to proofread,  errors can occur which can change the documentation meaning.    Final Clinical Impressions(s) / ED Diagnoses   Final diagnoses:  Arthralgia, unspecified joint    ED Discharge Orders         Ordered    predniSONE (DELTASONE) 20 MG tablet  Daily     09/20/19 0823           Lennice Sites, DO 09/20/19 [phone]

## 2019-09-20 NOTE — ED Triage Notes (Addendum)
C/o pain to rt elbow, wrist and hand onset 2 days ago  Worse yesterday  Denies inj,  States muscle in forearm feels flabby  States grip is weakened

## 2019-09-20 NOTE — Discharge Instructions (Addendum)
Recommend using Tylenol 1000 mg up to 4 times a day as well for pain

## 2019-09-26 ENCOUNTER — Ambulatory Visit (INDEPENDENT_AMBULATORY_CARE_PROVIDER_SITE_OTHER): Payer: Medicare HMO | Admitting: Family Medicine

## 2019-09-26 ENCOUNTER — Encounter: Payer: Self-pay | Admitting: Family Medicine

## 2019-09-26 ENCOUNTER — Other Ambulatory Visit: Payer: Self-pay

## 2019-09-26 VITALS — BP 120/72 | HR 74 | Temp 95.8°F | Ht 71.0 in | Wt 263.0 lb

## 2019-09-26 DIAGNOSIS — M25521 Pain in right elbow: Secondary | ICD-10-CM | POA: Diagnosis not present

## 2019-09-26 NOTE — Patient Instructions (Signed)
This is not arthritis.  Ice/cold pack over area for 10-15 min twice daily.  Consider a forearm strap (Band-IT) to help with your elbow. This can give your elbow a break and allow it to heal faster.   Elbow and Forearm Exercises It is normal to feel mild stretching, pulling, tightness, or discomfort as you do these exercises, but you should stop right away if you feel sudden pain or your pain gets worse. RANGE OF MOTION EXERCISES These exercises warm up your muscles and joints and improve the movement and flexibility of your injured elbow and forearm. These exercises also help to relieve pain, numbness, and tingling.These exercises are done using the muscles in your injured elbow and forearm. Exercise A: Elbow Flexion, Active 1. Hold your left / right arm at your side, and bend your elbow as far as you can using your left / right arm muscles. 2. Hold this position for 30 seconds. 3. Slowly return to the starting position. Repeat 2 times. Complete this exercise 3 times per week. Exercise B: Elbow Extension, Active 1. Hold your left / right arm at your side, and straighten your elbow as much as you can using your left / right arm muscles. 2. Hold this position for 30 seconds. 3. Slowly return to the starting position. Repeat 2 times. Complete this exercise 3 times per week. Exercise C: Forearm Rotation, Supination, Active 1. Stand or sit with your elbows at your sides. 2. Bend your left / right elbow to an "L" shape (90 degrees). 3. Turn your palm upward until you feel a gentle stretch on the inside of your forearm. 4. Hold this position for 30 seconds. 5. Slowly release and return to the starting position. Repeat 2 times. Complete this exercise 3 times per week. Exercise D: Forearm Rotation, Pronation, Active 1. Stand or sit with your elbows at your side. 2. Bend your left / right elbow to an "L" shape (90 degrees). 3. Turn your left / right palm downward until you feel a gentle  stretch on the top of your forearm. 4. Hold this position for 30 seconds. 5. Slowly release and return to the starting position. Repeat2 times. Complete this exercise 3 times per week. STRETCHING EXERCISES These exercises warm up your muscles and joints and improve the movement and flexibility of your injured elbow and forearm. These exercises also help to relieve pain, numbness, and tingling.These exercises are done using your healthy elbow and forearm to help stretch the muscles in your injured elbow and forearm. Exercise E: Elbow Flexion, Active-Assisted  1. Hold your left / right arm at your side, and bend your elbow as much as you can using your left / right arm muscles. 2. Use your other hand to bend your left / right elbow farther. To do this, gently push up on your forearm until you feel a gentle stretch on the back of your elbow. 3. Hold this position for 30 seconds. 4. Slowly return to the starting position. Repeat 2 times. Complete this exercise 3 times per week. Exercise F: Elbow Extension, Active-Assisted  1. Hold your left / right arm at your side, and straighten your elbow as much as you can using your left / right arm muscles. 2. Use your other hand to straighten the left / right elbow farther. To do this, gently push down on your forearm until you feel a gentle stretch on the inside of your elbow. 3. Hold this position for 30 seconds. 4. Slowly return to the starting  position. Repeat 2 times. Complete this exercise 3 times per weeky. Exercise G: Forearm Rotation, Supination, Active-Assisted  1. Sit with your left / right elbow bent in an "L" shape (90 degrees) with your forearm resting on a table. 2. Keeping your upper body and shoulder still, rotate your forearm so your left / right palm faces upward. 3. Use your other hand to help rotate your forearm further until you feel a gentle to moderate stretch. 4. Hold this position for 30 seconds. 5. Slowly release the stretch  and return to the starting position. Repeat 2 times. Complete this exercise 3 times per week. Exercise H: Forearm Rotation, Pronation, Active-Assisted  1. Sit with your left / right elbow bent in an "L" shape (90 degrees) with your forearm resting on a table. 2. Keeping your upper body and shoulder still, rotate your forearm so your palm faces the tabletop. 3. Use your other hand to help rotate your forearm further until you feel a gentle to moderate stretch. 4. Hold this position for 30 seconds. 5. Slowly release the stretch and return to the starting position. Repeat 2 times. Complete this exercise 3 times per week. Exercise I: Elbow Flexion, Supine, Passive 1. Lie on your back. 2. Extend your left / right arm up in the air, bracing it with your other hand. 3. Let your left / right your hand slowly lower toward your shoulder, while your elbow stays pointed toward the ceiling. You should feel a gentle stretch along the back of your upper arm and elbow. 4. If instructed by your health care provider, you may increase the intensity of your stretch by adding a small wrist weight or hand weight. 5. Hold this position for 3 seconds. 6. Slowly return to the starting position. Repeat 2 times. Complete this exercise 3 times per week. Exercise J: Elbow Extension, Supine, Passive  1. Lie on your back. Make sure that you are in a comfortable position that lets you relax your arm muscles. 2. Place a folded towel under your left / right upper arm so your elbow and shoulder are at the same height. Straighten your left / right arm so your elbow does not rest on the bed or towel. 3. Let the weight of your hand stretch your elbow. Keep your arm and chest muscles relaxed. You should feel a stretch on the inside of your elbow. 4. If told by your health care provider, you may increase the intensity of your stretch by adding a small wrist weight or hand weight. 5. Hold this position for 30 seconds. 6. Slowly  release the stretch. Repeat 2 times. Complete this exercise 3 times per week. STRENGTHENING EXERCISES These exercises build strength and endurance in your elbow and forearm. Endurance is the ability to use your muscles for a long time, even after they get tired. Exercise K: Elbow Flexion, Isometric  1. Stand or sit up straight. 2. Bend your left / right elbow in an "L" shape (90 degrees) and turn your palm up so your forearm is at the height of your waist. 3. Place your other hand on top of your forearm. Gently push down as your left / right arm resists. Push as hard as you can with both arms without causing any pain or movement at your left / right elbow. 4. Hold this position for 3 seconds. 5. Slowly release the tension in both arms. Let your muscles relax completely before repeating. Repeat 2 times. Complete this exercise 3 times per week. Exercise  L: Elbow Extensors, Isometric  1. Stand or sit up straight. 2. Place your left / right arm so your palm faces your abdomen and it is at the height of your waist. 3. Place your other hand on the underside of your forearm. Gently push up as your left / right arm resists. Push as hard as you can with both arms, without causing any pain or movement at your left / right elbow. 4. Hold this position for 3 seconds. 5. Slowly release the tension in both arms. Let your muscles relax completely before repeating. Repeat _______2___ times. Complete this exercise 3 times per week. Exercise M: Elbow Flexion With Forearm Palm Up  1. Sit upright on a firm chair without armrests, or stand. 2. Place your left / right arm at your side with your palm facing forward. 3. Holding a 5 lbweight or gripping a rubber exercise band or tubing, bend your elbow to bring your hand toward your shoulder. 4. Hold this position for 3 seconds. 5. Slowly return to the starting position. Repeat 2 times. Complete this exercise 3 times per week. Exercise N: Elbow  Extension  1. Sit on a firm chair without armrests, or stand. 2. Keeping your upper arms at your sides, bring both hands up toward your left / right shoulder while you grip a rubber exercise band or tubing. Your left / right hand should be just below the other hand. 3. Straighten your left / right elbow. 4. Hold this position for 3 seconds. 5. Control the resistance of the band or tubing as your hand returns to your side. Repeat 2 times. Complete this exercise 3 times per week. Exercise O: Forearm Rotation, Supination  1. Sit with your left / right forearm supported on a table. Keep your elbow at waist height. 2. Rest your hand over the edge of the table with your palm facing down. 3. Gently hold a lightweight hammer. 4. Without moving your elbow, slowly rotate your forearm to turn your palm and hand upward to a thumbs-up position. 5. Hold this position for 3 seconds. 6. Slowly return to the starting position. Repeat 2 times. Complete this exercise 3 times per week. Exercise P: Forearm Rotation, Pronation  1. Sit with your left / right forearm supported on a table. Keep your elbow below shoulder height. 2. Rest your hand over the edge of the table with your palm facing up. 3. Gently hold a lightweight hammer. 4. Without moving your elbow, slowly rotate your forearm to turn your palm and hand upward to a thumbs-up position. 5. Hold this position for 3 seconds. 6. Slowly return to the starting position. Repeat 2 times. Complete this exercise 3 times per week.  Make sure you discuss any questions you have with your health care provider. Document Released: 08/18/2005 Document Revised: 02/12/2016 Document Reviewed: 06/29/2015 Elsevier Interactive Patient Education  [employer].

## 2019-09-26 NOTE — Progress Notes (Signed)
Musculoskeletal Exam  Patient: Tommy Green DOB: 1946-05-09  DOS: 09/26/2019  SUBJECTIVE:  Chief Complaint:   Chief Complaint  Patient presents with  . Arm Pain    right    Tommy Green [place] is a 73 y.o.  male for evaluation and treatment of R arm pain.   Onset:  1 week ago. No inj or change in activity.  Location: R elbow radiating down through forearm and fingers Character:  sharp  Progression of issue:  Does not currently have Associated symptoms: no bruising, swelling, redness,  Treatment: to date has been prescription NSAIDS, ice, heat, and oral steroids.   Neurovascular symptoms: no  ROS: Musculoskeletal/Extremities: +R elbow pain  Past Medical History:  Diagnosis Date  . Anemia   . Essential hypertension 07/18/2017  . History of rheumatic fever   . History of stroke 07/18/2017  . Hypertriglyceridemia 07/18/2017  . Type 2 diabetes mellitus with diabetic neuropathy, unspecified ([place]) 07/18/2017    Objective: VITAL SIGNS: BP 120/72 (BP Location: Left Arm, Patient Position: Sitting, Cuff Size: Normal)   Pulse 74   Temp (!) 95.8 F (35.4 C) (Temporal)   Ht 5\' 11"  (1.803 m)   Wt 263 lb (119.3 kg)   SpO2 94%   BMI 36.68 kg/m  Constitutional: Well formed, well developed. No acute distress. Cardiovascular: Brisk cap refill Thorax & Lungs: No accessory muscle use Musculoskeletal: L elbow.   Normal active range of motion: yes.   Normal passive range of motion: yes Tenderness to palpation: mild ttp over the lat epicondyle Deformity: no Ecchymosis: no Neurologic: Normal sensory function.  Psychiatric: Normal mood. Age appropriate judgment and insight. Alert & oriented x 3.    Assessment:  Right elbow pain  Plan: Forearm strap. Stretches/exercises. Tylenol.  Sounds like he had a combination of olecranon bursitis and lateral epicondylitis.  If symptoms recur and the current plan does not help, he will come in for possible injections versus referral to physical  therapy. F/u prn. The patient voiced understanding and agreement to the plan.   [place], DO 09/26/19  1:48 PM

## 2019-10-02 DIAGNOSIS — E876 Hypokalemia: Secondary | ICD-10-CM | POA: Diagnosis not present

## 2019-10-02 DIAGNOSIS — Z6837 Body mass index (BMI) 37.0-37.9, adult: Secondary | ICD-10-CM | POA: Diagnosis not present

## 2019-10-02 DIAGNOSIS — E1165 Type 2 diabetes mellitus with hyperglycemia: Secondary | ICD-10-CM | POA: Diagnosis not present

## 2019-10-02 DIAGNOSIS — E782 Mixed hyperlipidemia: Secondary | ICD-10-CM | POA: Diagnosis not present

## 2019-10-03 ENCOUNTER — Encounter: Payer: Self-pay | Admitting: Family Medicine

## 2019-10-03 ENCOUNTER — Other Ambulatory Visit: Payer: Self-pay

## 2019-10-03 ENCOUNTER — Ambulatory Visit (INDEPENDENT_AMBULATORY_CARE_PROVIDER_SITE_OTHER): Payer: Medicare HMO | Admitting: Family Medicine

## 2019-10-03 ENCOUNTER — Telehealth: Payer: Self-pay | Admitting: *Deleted

## 2019-10-03 VITALS — BP 122/82 | HR 79 | Temp 96.5°F | Wt 261.0 lb

## 2019-10-03 DIAGNOSIS — G8929 Other chronic pain: Secondary | ICD-10-CM | POA: Insufficient documentation

## 2019-10-03 DIAGNOSIS — M25561 Pain in right knee: Secondary | ICD-10-CM

## 2019-10-03 DIAGNOSIS — M25532 Pain in left wrist: Secondary | ICD-10-CM

## 2019-10-03 MED ORDER — PREDNISONE 20 MG PO TABS: 40.0000 mg | ORAL_TABLET | Freq: Every day | ORAL | 0 refills | Status: AC

## 2019-10-03 MED FILL — predniSONE 20 MG TABS: 20 | 5 days supply | Qty: 10 | Fill #0

## 2019-10-03 NOTE — Telephone Encounter (Signed)
Copied from [place] [id number]. Topic: Appointment Scheduling - Scheduling Inquiry for Clinic >> Oct 03, 2019  8:04 AM Rayann Heman wrote: Reason for CRM: pt called for an appointment today. Pt is having left wrist pain and right knee pain. Please advise

## 2019-10-03 NOTE — Telephone Encounter (Signed)
Appt. Has been scheduled .

## 2019-10-03 NOTE — Progress Notes (Signed)
Musculoskeletal Exam  Patient: Tommy Green DOB: 26-Sep-1946  DOS: 10/03/2019  SUBJECTIVE:  Chief Complaint:   Chief Complaint  Patient presents with  . Wrist Pain    left  . Knee Pain    right    Tommy Green is a 73 y.o.  male for evaluation and treatment of L wrist/elbow pain.   Onset:  1 day ago. No inj or change in activity.  Location: Started on dorsal side of L wrist and spreading through forearm to elbow Character:  aching and shooting  Progression of issue:  is unchanged Associated symptoms: no bruising, swelling, redness Treatment: to date has been ice and warmth, wrist band.   Neurovascular symptoms: no  R knee pain comes and goes over past couple mo. No inj or change in activity. Sometimes gets stuck through ROM. Does not give out on him. Feels pain in the front/inside of the knee.   ROS: Musculoskeletal/Extremities: +L forearm pain Neuro: No weakness  Past Medical History:  Diagnosis Date  . Anemia   . Essential hypertension 07/18/2017  . History of rheumatic fever   . History of stroke 07/18/2017  . Hypertriglyceridemia 07/18/2017  . Type 2 diabetes mellitus with diabetic neuropathy, unspecified ([place]) 07/18/2017    Objective: VITAL SIGNS: BP 122/82 (BP Location: Left Arm, Patient Position: Sitting, Cuff Size: Normal)   Pulse 79   Temp (!) 96.5 F (35.8 C) (Temporal)   Wt 261 lb (118.4 kg)   SpO2 94%   BMI 36.40 kg/m  Constitutional: Well formed, well developed. No acute distress. Cardiovascular: Brisk cap refill Thorax & Lungs: No accessory muscle use Musculoskeletal: L elbow/wrist.   Normal active range of motion: yes.   Normal passive range of motion: yes Tenderness to palpation: Yes over dorsal L wrist extensor tendons.  Deformity: no Ecchymosis: no No erythema, excessive warmth, edema R knee: mild jt line ttp just lat and medial to pat tendon. No effusion, excessive warmth, erythema. Neg ant/post drawer, pat app/grind, varus/valgus  stress, Stine's Neurologic: Normal sensory function. Psychiatric: Normal mood. Age appropriate judgment and insight. Alert & oriented x 3.    Assessment:  Left wrist pain - Plan: predniSONE (DELTASONE) 20 MG tablet  Chronic pain of right knee  Plan: 1- Seems to be an acute tendinitis of the extensor compartment over wrist. Doubt infection or compartment syndrome given lack of erythema/edema/systemic s/s's and location. Ice, splint, stretches/exercises, Tylenol, pred burst. 2- Suspect mild meniscal involvement given interruption of ROM and jt line tenderness. Stretches/exercises.  F/u in 1 week if no better. The patient voiced understanding and agreement to the plan.   [place], DO 10/03/19  11:40 AM

## 2019-10-03 NOTE — Patient Instructions (Addendum)
Ice/cold pack over area for 10-15 min twice daily.  OK to take Tylenol 1000 mg (2 extra strength tabs) or 975 mg (3 regular strength tabs) every 6 hours as needed.  Cancel appointment if you are doing better.   Wrist and Forearm Exercises Do exercises exactly as told by your health care provider and adjust them as directed. It is normal to feel mild stretching, pulling, tightness, or discomfort as you do these exercises, but you should stop right away if you feel sudden pain or your pain gets worse.   RANGE OF MOTION EXERCISES These exercises warm up your muscles and joints and improve the movement and flexibility of your injured wrist and forearm. These exercises also help to relieve pain, numbness, and tingling. These exercises are done using the muscles in your injured wrist and forearm. Exercise A: Wrist Flexion, Active 1. With your fingers relaxed, bend your wrist forward as far as you can. 2. Hold this position for 30 seconds. Repeat 2 times. Complete this exercise 3 times per week. Exercise B: Wrist Extension, Active 1. With your fingers relaxed, bend your wrist backward as far as you can. 2. Hold this position for 30 seconds. Repeat 2 times. Complete this exercise 3 times per week. Exercise C: Supination, Active  1. Stand or sit with your arms at your sides. 2. Bend your left / right elbow to an "L" shape (90 degrees). 3. Turn your palm upward until you feel a gentle stretch on the inside of your forearm. 4. Hold this position for 30 seconds. 5. Slowly return your palm to the starting position. Repeat 2 times. Complete this exercise 3 times per week. Exercise D: Pronation, Active  1. Stand or sit with your arms at your sides. 2. Bend your left / right elbow to an "L" shape (90 degrees). 3. Turn your palm downward until you feel a gentle stretch on the top of your forearm. 4. Hold this position for 30 seconds. 5. Slowly return your palm to the starting position. Repeat 2  times. Complete this exercise once a day.  STRETCHING EXERCISES These exercises warm up your muscles and joints and improve the movement and flexibility of your injured wrist and forearm. These exercises also help to relieve pain, numbness, and tingling. These exercises are done using your healthy wrist and forearm to help stretch the muscles in your injured wrist and forearm. Exercise E: Wrist Flexion, Passive  1. Extend your left / right arm in front of you, relax your wrist, and point your fingers downward. 2. Gently push on the back of your hand. Stop when you feel a gentle stretch on the top of your forearm. 3. Hold this position for 30 seconds. Repeat 2 times. Complete this exercise 3 times per week. Exercise F: Wrist Extension, Passive  1. Extend your left / right arm in front of you and turn your palm upward. 2. Gently pull your palm and fingertips back so your fingers point downward. You should feel a gentle stretch on the palm-side of your forearm. 3. Hold this position for 30 seconds. Repeat 2 times. Complete this exercise 3 times per week. Exercise G: Forearm Rotation, Supination, Passive 1. Sit with your left / right elbow bent to an "L" shape (90 degrees) with your forearm resting on a table. 2. Keeping your upper body and shoulder still, use your other hand to rotate your forearm palm-up until you feel a gentle to moderate stretch. 3. Hold this position for 30 seconds. 4. Slowly  release the stretch and return to the starting position. Repeat 2 times. Complete this exercise 3 times per week. Exercise H: Forearm Rotation, Pronation, Passive 1. Sit with your left / right elbow bent to an "L" shape (90 degrees) with your forearm resting on a table. 2. Keeping your upper body and shoulder still, use your other hand to rotate your forearm palm-down until you feel a gentle to moderate stretch. 3. Hold this position for 30 seconds. 4. Slowly release the stretch and return to the  starting position. Repeat 2 times. Complete this exercise 3 times per week.  STRENGTHENING EXERCISES These exercises build strength and endurance in your wrist and forearm. Endurance is the ability to use your muscles for a long time, even after they get tired. Exercise I: Wrist Flexors  1. Sit with your left / right forearm supported on a table and your hand resting palm-up over the edge of the table. Your elbow should be bent to an "L" shape (about 90 degrees) and be below the level of your shoulder. 2. Hold a 3-5 lb weight in your left / right hand. Or, hold a rubber exercise band or tube in both hands, keeping your hands at the same level and hip distance apart. There should be a slight tension in the exercise band or tube. 3. Slowly curl your hand up toward your forearm. 4. Hold this position for 3 seconds. 5. Slowly lower your hand back to the starting position. Repeat 2 times. Complete this exercise 3 times per week. Exercise J: Wrist Extensors  1. Sit with your left / right forearm supported on a table and your hand resting palm-down over the edge of the table. Your elbow should be bent to an "L" shape (about 90 degrees) and be below the level of your shoulder. 2. Hold a 3-5 lb weight in your left / right hand. Or, hold a rubber exercise band or tube in both hands, keeping your hands at the same level and hip distance apart. There should be a slight tension in the exercise band or tube. 3. Slowly curl your hand up toward your forearm. 4. Hold this position for 3 seconds. 5. Slowly lower your hand back to the starting position. Repeat 2 times. Complete this exercise 3 times per week. Exercise K: Forearm Rotation, Supination  1. Sit with your left / right forearm supported on a table and your hand resting palm-down. Your elbow should be at your side, bent to an "L" shape (about 90 degrees), and below the level of your shoulder. Keep your wrist stable and in a neutral position throughout  the exercise. 2. Gently hold a lightweight hammer with your left / right hand. 3. Without moving your elbow or wrist, slowly rotate your palm upward to a thumbs-up position. 4. Hold this position for 3 seconds. 5. Slowly return your forearm to the starting position. Repeat 2 times. Complete this exercise 3 times per week. Exercise L: Forearm Rotation, Pronation  1. Sit with your left / right forearm supported on a table and your hand resting palm-up. Your elbow should be at your side, bent to an "L" shape (about 90 degrees), and below the level of your shoulder. Keep your wrist stable. Do not allow it to move backward or forward during the exercise. 2. Gently hold a lightweight hammer with your left / right hand. 3. Without moving your elbow or wrist, slowly rotate your palm and hand upward to a thumbs-up position. 4. Hold this position for  3 seconds. 5. Slowly return your forearm to the starting position. Repeat 2 times. Complete this exercise 3 times per week. Exercise M: Grip Strengthening  1. Hold one of these items in your left / right hand: play dough, therapy putty, a dense sponge, a stress ball, or a large, rolled sock. 2. Squeeze as hard as you can without increasing pain. 3. Hold this position for 5 seconds. 4. Slowly release your grip. Repeat 2 times. Complete this exercise 3 times per week.  This information is not intended to replace advice given to you by your health care provider. Make sure you discuss any questions you have with your health care provider. Document Released: 08/18/2005 Document Revised: 06/28/2016 Document Reviewed: 06/29/2015 Elsevier Interactive Patient Education  2018 [place] and range of motion exercises These exercises warm up your muscles and joints and improve the movement and flexibility of your knee. These exercises also help to relieve pain and stiffness.  Exercise A: Knee flexion, active 3. Lie on your back with both knees  straight. If this causes back discomfort, bend your uninjured knee so your foot is flat on the floor. 4. Slowly slide your left / right heel back toward your buttocks until you feel a gentle stretch in the front of your knee or thigh. Stop if you have pain. 5. Hold for3 seconds. 6. Slowly slide your left / right heel back to the starting position. 10 total repetitions. Repeat 2 times. Complete this exercise 3 times a week.  Exercise B: Knee extension, sitting 3. Sit with your left / right heel propped on a chair, a coffee table, or a footstool. Do not have anything under your knee to support it. 4. Allow your leg muscles to relax, letting gravity straighten out your knee. You should feel a stretch behind your left / right knee. 5. If told by your health care provide just above your kneecap. 6. Hold this position for 3 seconds. 7. Repeat for a total of 10 repetitions. Repeat 2 times. Complete this stretch 3 times a week.  Strengthening exercises These exercises build strength and endurance in your knee. Endurance is the ability to use your muscles for a long time, even after they get tired.  Exercise C: Quadriceps, isometric 6. Lie on your back with your left / right leg extended and your other knee bent. Put a rolled towel or small pillow under your right/left knee if told by your health care provider. 7. Slowly tense the muscles in the front of your left / right thigh by pushing the back of your knee down. You should see your knee cap slide up toward your hip or see increased dimpling just above the knee. 8. For 3 seconds, keep the muscle as tight as you can without increasing your pain. 9. Relax the muscles slowly and completely. Repeat for 10 total repetitions. Repeat 2 times. Complete this exercise 3 times a week. Exercise D: Straight leg raises (quadriceps) 6. Lie on your back with your left / right leg extended and your other knee bent. 7. Tense the muscles in the front of your left /  right thigh. You should see your kneecap slide up or see increased dimpling just above the knee. 8. Keep these muscles tight as you raise your leg 4-6 inches (10-15 cm) off the floor. 9. Hold this position for 3 seconds. 10. Keep these muscles tense as you lower your leg. 11. Relax the muscles slowly and completely. Repeat for a total of  10 repetitions. Repeat 2 times. Complete this exercise 3 times a week.  Exercise E: Hamstring curls 4. On the floor or a bed, lie on your abdomen with your legs straight. Put a folded towel or small pillow under your left / right thigh, just above your kneecap. 5. Slowly bend your left / right knee as far as you can without pain. Keep your hips flat against the floor or bed. 6. Hold this position for 3 seconds. 7. Slowly lower your leg to the starting position. Repeat for a total of 10 repetitions. Repeat 2 times. Complete this exercise 3 times per week.  Stretching exercises These exercises warm up your muscles and joints and improve the movement and flexibility of your knee. These exercises also help to relieve pain and stiffness.  Exercise A: Quadriceps, prone 1. Lie on your abdomen on a firm surface, such as a bed or padded floor. 2. Bend your left / right knee and hold your ankle. If you cannot reach your ankle or pant leg, loop a belt around your foot and grab the belt instead. 3. Gently pull your heel toward your buttocks. Your knee should not slide out to the side. You should feel a stretch in the front of your thigh and knee. 4. Hold this position for 30 seconds. Repeat 2 times. Complete this stretch 3 times a week.  Exercise B: Hamstring, doorway 1. Lie on your back in front of a doorway with your left / right leg resting against the wall and your other leg flat on the floor in the doorway. There should be a slight bend in your left / right knee. 2. Straighten your left / right knee. You should feel a stretch behind your knee or thigh. If you do  not feel that stretch, scoot your buttocks closer to the door. 3. Hold this position for 30 seconds. Repeat 2 times. Complete this stretch 3 times a week.  Strengthening exercises These exercises build strength and endurance in your knee and leg muscles. Endurance is the ability to use your muscles for a long time, even after they get tired.   Exercise D: Wall slides (quadriceps) 1. Lean your back against a smooth wall or door, and walk your feet out 18-24 inches (45-61 cm) from it. 2. Place your feet hip-width apart. 3. Slowly slide down the wall or door until your knees bend 90 degrees. Keep your knees over your heels, not over your toes. Keep your knees in line with your hips. 4. Hold for 2 seconds. 5. Stand up to rest for 60 seconds. Repeat 2 times. Complete this exercise 3 times a week.  Exercise E: Bridge (hip extensors) 1. Lie on your back on a firm surface with your knees bent and your feet flat on the floor. 2. Tighten your buttocks muscles and lift your bottom off the floor until your trunk is level with your thighs. ? Do not arch your back. ? You should feel the muscles working in your buttocks and the back of your thighs. 3. Hold this position for 2 seconds. 4. Slowly lower your hips to the starting position. 5. Let your buttocks muscles relax completely between repetitions. Repeat 2 times. Complete this exercise 3 times a week.

## 2019-10-04 DIAGNOSIS — R69 Illness, unspecified: Secondary | ICD-10-CM | POA: Diagnosis not present

## 2019-10-10 ENCOUNTER — Ambulatory Visit: Payer: Medicare HMO | Admitting: Family Medicine

## 2019-10-22 ENCOUNTER — Other Ambulatory Visit: Payer: Self-pay

## 2019-10-23 ENCOUNTER — Encounter: Payer: Self-pay | Admitting: Neurology

## 2019-10-23 ENCOUNTER — Other Ambulatory Visit: Payer: Self-pay

## 2019-10-23 ENCOUNTER — Ambulatory Visit (INDEPENDENT_AMBULATORY_CARE_PROVIDER_SITE_OTHER): Payer: Medicare HMO | Admitting: Family Medicine

## 2019-10-23 ENCOUNTER — Encounter: Payer: Self-pay | Admitting: Family Medicine

## 2019-10-23 VITALS — BP 128/78 | HR 51 | Temp 96.4°F

## 2019-10-23 DIAGNOSIS — R2689 Other abnormalities of gait and mobility: Secondary | ICD-10-CM | POA: Diagnosis not present

## 2019-10-23 DIAGNOSIS — R531 Weakness: Secondary | ICD-10-CM | POA: Diagnosis not present

## 2019-10-23 DIAGNOSIS — R4781 Slurred speech: Secondary | ICD-10-CM | POA: Diagnosis not present

## 2019-10-23 DIAGNOSIS — M25511 Pain in right shoulder: Secondary | ICD-10-CM

## 2019-10-23 DIAGNOSIS — E781 Pure hyperglyceridemia: Secondary | ICD-10-CM | POA: Diagnosis not present

## 2019-10-23 MED ORDER — LOVASTATIN 10 MG PO TABS: 10.0000 mg | ORAL_TABLET | Freq: Every day | ORAL | 3 refills | Status: DC

## 2019-10-23 MED ORDER — LOVASTATIN 10 MG PO TABS: 10.0000 mg | ORAL_TABLET | Freq: Every day | ORAL | 2 refills | Status: DC

## 2019-10-23 NOTE — Progress Notes (Signed)
Chief Complaint  Patient presents with  . Fatigue    Subjective: Patient is a 74 y.o. male here for a fall/fatigue.  He is here with his wife.  Around 1 week ago, the patient sustained a fall in his home.  He was having balance issues just prior to this.  He also experienced slurred speech, difficulty with word finding, and weakness on the right side of his body.  He did not seek emergent care at that time.  He has overall weakness that continues to be worse on the right side of his body in addition to difficulty with speech and word finding.  He is still slurring his speech according to his wife.  He is not having any difficulty swallowing, vision changes, headaches, or paresthesias.  The patient does have a history of hypertriglyceridemia.  He is taking a max dose of a fibrate.  He has been statin intolerant.  He was unable to afford Vascepa.  We tried to refer him to the cardiology clinic, but due to financial concerns, he declined that offer.  ROS: Neuro: As noted in HPI Eyes: No vision changes  Past Medical History:  Diagnosis Date  . Anemia   . Essential hypertension 07/18/2017  . History of rheumatic fever   . History of stroke 07/18/2017  . Hypertriglyceridemia 07/18/2017  . Type 2 diabetes mellitus with diabetic neuropathy, unspecified ([place]) 07/18/2017    Objective: BP 128/78 (BP Location: Left Arm, Patient Position: Sitting, Cuff Size: Normal)   Pulse (!) 51   Temp (!) 96.4 F (35.8 C) (Temporal)   SpO2 93%  General: Awake, appears stated age HEENT: MMM, EOMi Heart: RRR, no LE edema Neuro: DTR's equal and symmetric throughout, gait is unsteady and slow, speech slurred compared to baseline, but is understandable. He has 4/5 strength on R side compared to L.He has difficulty articulating his thoughts throughout the exam. No dysphagia.  MSK: Decreased active/passive ROM. Mild ttp over trap.+Neer's, [place]. Neg cross over, speed's. No bony ttp.  Lungs: CTAB, no rales, wheezes  or rhonchi. No accessory muscle use Psych: Age appropriate judgment and insight, normal affect and mood  Assessment and Plan: Weakness - Plan: Ambulatory referral to Neurology, Ambulatory referral to Physical Therapy  Hypertriglyceridemia  Slurred speech - Plan: Ambulatory referral to Speech Therapy  Balance disorder - Plan: Ambulatory referral to Physical Therapy  Acute pain of right shoulder  I am very concerned he had a stroke.  I would like him to see the neurology team within the next few days.  I think he needs imaging.  I also want him to speak with the speech team and physical therapy team.  Because this happened 1 week ago, I doubt he meets inpatient criteria.  I did discuss with his wife that I want him to go to the ER should anything like this happen again or if he has worsening.  I will hold off on dual antiplatelet therapy as I do not have any imaging and there is still a slight risk of hemorrhagic stroke.  I will have a referral team reach out to see if they want me to start the process on any specific imaging.  I held off because they may order other tests such as imaging of the carotids and angiogram of the head where I was planning on just getting an MRI without contrast.  We do need to start a statin.  I started the very low-dose of lovastatin.  He will let me know how he  does.  He seemed more willing to get set up with the cardiology team but I would like him to see the neurology team first. I have no doubt his uncontrolled levels of TG's contributed to this. He does have R shoulder pain, rec'd topical NSAID and Tylenol, heat, ice.  The patient voiced understanding and agreement to the plan.  [place], DO 10/23/19  2:35 PM

## 2019-10-23 NOTE — Patient Instructions (Addendum)
The Neurology team should reach out to you in the next day or so.   Use a topical medicine called Voltaren for the shoulder.  Do not fall.  If you do not hear anything about your referral in the next 1-2 weeks, call our office and ask for an update.  Let us know if you need anything.  EXERCISES  RANGE OF MOTION (ROM) AND STRETCHING EXERCISES These exercises may help you when beginning to rehabilitate your injury. While completing these exercises, remember:   Restoring tissue flexibility helps normal motion to return to the joints. This allows healthier, less painful movement and activity.  An effective stretch should be held for at least 30 seconds.  A stretch should never be painful. You should only feel a gentle lengthening or release in the stretched tissue.  ROM - Pendulum  Bend at the waist so that your right / left arm falls away from your body. Support yourself with your opposite hand on a solid surface, such as a table or a countertop.  Your right / left arm should be perpendicular to the ground. If it is not perpendicular, you need to lean over farther. Relax the muscles in your right / left arm and shoulder as much as possible.  Gently sway your hips and trunk so they move your right / left arm without any use of your right / left shoulder muscles.  Progress your movements so that your right / left arm moves side to side, then forward and backward, and finally, both clockwise and counterclockwise.  Complete 10-15 repetitions in each direction. Many people use this exercise to relieve discomfort in their shoulder as well as to gain range of motion. Repeat 2 times. Complete this exercise 3 times per week.  STRETCH - Flexion, Standing  Stand with good posture. With an underhand grip on your right / left hand and an overhand grip on the opposite hand, grasp a broomstick or cane so that your hands are a little more than shoulder-width apart.  Keeping your right / left elbow  straight and shoulder muscles relaxed, push the stick with your opposite hand to raise your right / left arm in front of your body and then overhead. Raise your arm until you feel a stretch in your right / left shoulder, but before you have increased shoulder pain.  Try to avoid shrugging your right / left shoulder as your arm rises by keeping your shoulder blade tucked down and toward your mid-back spine. Hold 30 seconds.  Slowly return to the starting position. Repeat 2 times. Complete this exercise 3 times per week.  STRETCH - Internal Rotation  Place your right / left hand behind your back, palm-up.  Throw a towel or belt over your opposite shoulder. Grasp the towel/belt with your right / left hand.  While keeping an upright posture, gently pull up on the towel/belt until you feel a stretch in the front of your right / left shoulder.  Avoid shrugging your right / left shoulder as your arm rises by keeping your shoulder blade tucked down and toward your mid-back spine.  Hold 30. Release the stretch by lowering your opposite hand. Repeat 2 times. Complete this exercise 3 times per week.  STRETCH - External Rotation and Abduction  Stagger your stance through a doorframe. It does not matter which foot is forward.  As instructed by your physician, physical therapist or athletic trainer, place your hands: ? And forearms above your head and on the door frame. ?  And forearms at head-height and on the door frame. ? At elbow-height and on the door frame.  Keeping your head and chest upright and your stomach muscles tight to prevent over-extending your low-back, slowly shift your weight onto your front foot until you feel a stretch across your chest and/or in the front of your shoulders.  Hold 30 seconds. Shift your weight to your back foot to release the stretch. Repeat 2 times. Complete this stretch 3 times per week.   STRENGTHENING EXERCISES  These exercises may help you when beginning  to rehabilitate your injury. They may resolve your symptoms with or without further involvement from your physician, physical therapist or athletic trainer. While completing these exercises, remember:   Muscles can gain both the endurance and the strength needed for everyday activities through controlled exercises.  Complete these exercises as instructed by your physician, physical therapist or athletic trainer. Progress the resistance and repetitions only as guided.  You may experience muscle soreness or fatigue, but the pain or discomfort you are trying to eliminate should never worsen during these exercises. If this pain does worsen, stop and make certain you are following the directions exactly. If the pain is still present after adjustments, discontinue the exercise until you can discuss the trouble with your clinician.  If advised by your physician, during your recovery, avoid activity or exercises which involve actions that place your right / left hand or elbow above your head or behind your back or head. These positions stress the tissues which are trying to heal.  STRENGTH - Scapular Depression and Adduction  With good posture, sit on a firm chair. Supported your arms in front of you with pillows, arm rests or a table top. Have your elbows in line with the sides of your body.  Gently draw your shoulder blades down and toward your mid-back spine. Gradually increase the tension without tensing the muscles along the top of your shoulders and the back of your neck.  Hold for 3 seconds. Slowly release the tension and relax your muscles completely before completing the next repetition.  After you have practiced this exercise, remove the arm support and complete it in standing as well as sitting. Repeat 2 times. Complete this exercise 3 times per week.   STRENGTH - External Rotators  Secure a rubber exercise band/tubing to a fixed object so that it is at the same height as your right / left  elbow when you are standing or sitting on a firm surface.  Stand or sit so that the secured exercise band/tubing is at your side that is not injured.  Bend your elbow 90 degrees. Place a folded towel or small pillow under your right / left arm so that your elbow is a few inches away from your side.  Keeping the tension on the exercise band/tubing, pull it away from your body, as if pivoting on your elbow. Be sure to keep your body steady so that the movement is only coming from your shoulder rotating.  Hold 3 seconds. Release the tension in a controlled manner as you return to the starting position. Repeat 2 times. Complete this exercise 3 times per week.   STRENGTH - Supraspinatus  Stand or sit with good posture. Grasp a 2-3 lb weight or an exercise band/tubing so that your hand is "thumbs-up," like when you shake hands.  Slowly lift your right / left hand from your thigh into the air, traveling about 30 degrees from straight out at your side.  Lift your hand to shoulder height or as far as you can without increasing any shoulder pain. Initially, many people do not lift their hands above shoulder height.  Avoid shrugging your right / left shoulder as your arm rises by keeping your shoulder blade tucked down and toward your mid-back spine.  Hold for 3 seconds. Control the descent of your hand as you slowly return to your starting position. Repeat 2 times. Complete this exercise 3 times per week.   STRENGTH - Shoulder Extensors  Secure a rubber exercise band/tubing so that it is at the height of your shoulders when you are either standing or sitting on a firm arm-less chair.  With a thumbs-up grip, grasp an end of the band/tubing in each hand. Straighten your elbows and lift your hands straight in front of you at shoulder height. Step back away from the secured end of band/tubing until it becomes tense.  Squeezing your shoulder blades together, pull your hands down to the sides of your  thighs. Do not allow your hands to go behind you.  Hold for 3 seconds. Slowly ease the tension on the band/tubing as you reverse the directions and return to the starting position. Repeat 2 times. Complete this exercise 3 times per week.   STRENGTH - Scapular Retractors  Secure a rubber exercise band/tubing so that it is at the height of your shoulders when you are either standing or sitting on a firm arm-less chair.  With a palm-down grip, grasp an end of the band/tubing in each hand. Straighten your elbows and lift your hands straight in front of you at shoulder height. Step back away from the secured end of band/tubing until it becomes tense.  Squeezing your shoulder blades together, draw your elbows back as you bend them. Keep your upper arm lifted away from your body throughout the exercise.  Hold 3 seconds. Slowly ease the tension on the band/tubing as you reverse the directions and return to the starting position. Repeat 2 times. Complete this exercise 3 times per week.  STRENGTH - Scapular Depressors  Find a sturdy chair without wheels, such as a from a dining room table.  Keeping your feet on the floor, lift your bottom from the seat and lock your elbows.  Keeping your elbows straight, allow gravity to pull your body weight down. Your shoulders will rise toward your ears.  Raise your body against gravity by drawing your shoulder blades down your back, shortening the distance between your shoulders and ears. Although your feet should always maintain contact with the floor, your feet should progressively support less body weight as you get stronger.  Hold 3 seconds. In a controlled and slow manner, lower your body weight to begin the next repetition. Repeat 2 times. Complete this exercise 3 times per week.   This information is not intended to replace advice given to you by your health care provider. Make sure you discuss any questions you have with your health care  provider.  Document Released: 08/18/2005 Document Revised: 10/25/2014 Document Reviewed: 01/16/2009 Elsevier Interactive Patient Education [employer].

## 2019-10-24 ENCOUNTER — Inpatient Hospital Stay (HOSPITAL_BASED_OUTPATIENT_CLINIC_OR_DEPARTMENT_OTHER)
Admission: EM | Admit: 2019-10-24 | Discharge: 2019-11-16 | DRG: 025 | Disposition: A | Discharge status: Skilled Nursing Facility | Payer: Medicare HMO | Attending: Internal Medicine | Admitting: Internal Medicine

## 2019-10-24 ENCOUNTER — Other Ambulatory Visit: Payer: Self-pay

## 2019-10-24 ENCOUNTER — Emergency Department (HOSPITAL_BASED_OUTPATIENT_CLINIC_OR_DEPARTMENT_OTHER): Payer: Medicare HMO

## 2019-10-24 ENCOUNTER — Other Ambulatory Visit: Payer: Self-pay | Admitting: Family Medicine

## 2019-10-24 ENCOUNTER — Telehealth: Payer: Self-pay | Admitting: Family Medicine

## 2019-10-24 ENCOUNTER — Encounter (HOSPITAL_BASED_OUTPATIENT_CLINIC_OR_DEPARTMENT_OTHER): Payer: Self-pay | Admitting: *Deleted

## 2019-10-24 DIAGNOSIS — S065X9A Traumatic subdural hemorrhage with loss of consciousness of unspecified duration, initial encounter: Secondary | ICD-10-CM | POA: Diagnosis not present

## 2019-10-24 DIAGNOSIS — Z86008 Personal history of in-situ neoplasm of other site: Secondary | ICD-10-CM | POA: Diagnosis not present

## 2019-10-24 DIAGNOSIS — S065XAA Traumatic subdural hemorrhage with loss of consciousness status unknown, initial encounter: Secondary | ICD-10-CM

## 2019-10-24 DIAGNOSIS — E1140 Type 2 diabetes mellitus with diabetic neuropathy, unspecified: Secondary | ICD-10-CM | POA: Diagnosis present

## 2019-10-24 DIAGNOSIS — Z66 Do not resuscitate: Secondary | ICD-10-CM | POA: Diagnosis present

## 2019-10-24 DIAGNOSIS — Z8673 Personal history of transient ischemic attack (TIA), and cerebral infarction without residual deficits: Secondary | ICD-10-CM

## 2019-10-24 DIAGNOSIS — R0600 Dyspnea, unspecified: Secondary | ICD-10-CM | POA: Diagnosis not present

## 2019-10-24 DIAGNOSIS — N182 Chronic kidney disease, stage 2 (mild): Secondary | ICD-10-CM | POA: Diagnosis present

## 2019-10-24 DIAGNOSIS — M47812 Spondylosis without myelopathy or radiculopathy, cervical region: Secondary | ICD-10-CM | POA: Diagnosis not present

## 2019-10-24 DIAGNOSIS — G935 Compression of brain: Secondary | ICD-10-CM | POA: Diagnosis not present

## 2019-10-24 DIAGNOSIS — E1122 Type 2 diabetes mellitus with diabetic chronic kidney disease: Secondary | ICD-10-CM | POA: Diagnosis present

## 2019-10-24 DIAGNOSIS — R1310 Dysphagia, unspecified: Secondary | ICD-10-CM | POA: Diagnosis not present

## 2019-10-24 DIAGNOSIS — Z87891 Personal history of nicotine dependence: Secondary | ICD-10-CM

## 2019-10-24 DIAGNOSIS — R279 Unspecified lack of coordination: Secondary | ICD-10-CM | POA: Diagnosis not present

## 2019-10-24 DIAGNOSIS — R339 Retention of urine, unspecified: Secondary | ICD-10-CM | POA: Diagnosis not present

## 2019-10-24 DIAGNOSIS — N189 Chronic kidney disease, unspecified: Secondary | ICD-10-CM | POA: Diagnosis not present

## 2019-10-24 DIAGNOSIS — S065X0A Traumatic subdural hemorrhage without loss of consciousness, initial encounter: Secondary | ICD-10-CM | POA: Diagnosis not present

## 2019-10-24 DIAGNOSIS — J14 Pneumonia due to Hemophilus influenzae: Secondary | ICD-10-CM | POA: Diagnosis not present

## 2019-10-24 DIAGNOSIS — Z743 Need for continuous supervision: Secondary | ICD-10-CM | POA: Diagnosis not present

## 2019-10-24 DIAGNOSIS — R0902 Hypoxemia: Secondary | ICD-10-CM

## 2019-10-24 DIAGNOSIS — E781 Pure hyperglyceridemia: Secondary | ICD-10-CM | POA: Diagnosis present

## 2019-10-24 DIAGNOSIS — I639 Cerebral infarction, unspecified: Secondary | ICD-10-CM | POA: Diagnosis not present

## 2019-10-24 DIAGNOSIS — I10 Essential (primary) hypertension: Secondary | ICD-10-CM | POA: Diagnosis not present

## 2019-10-24 DIAGNOSIS — N179 Acute kidney failure, unspecified: Secondary | ICD-10-CM | POA: Diagnosis present

## 2019-10-24 DIAGNOSIS — I629 Nontraumatic intracranial hemorrhage, unspecified: Secondary | ICD-10-CM | POA: Diagnosis not present

## 2019-10-24 DIAGNOSIS — I6203 Nontraumatic chronic subdural hemorrhage: Secondary | ICD-10-CM | POA: Diagnosis not present

## 2019-10-24 DIAGNOSIS — Z79899 Other long term (current) drug therapy: Secondary | ICD-10-CM | POA: Diagnosis not present

## 2019-10-24 DIAGNOSIS — I959 Hypotension, unspecified: Secondary | ICD-10-CM | POA: Diagnosis not present

## 2019-10-24 DIAGNOSIS — G9340 Encephalopathy, unspecified: Secondary | ICD-10-CM

## 2019-10-24 DIAGNOSIS — E1121 Type 2 diabetes mellitus with diabetic nephropathy: Secondary | ICD-10-CM | POA: Diagnosis not present

## 2019-10-24 DIAGNOSIS — Z981 Arthrodesis status: Secondary | ICD-10-CM

## 2019-10-24 DIAGNOSIS — E872 Acidosis: Secondary | ICD-10-CM | POA: Diagnosis present

## 2019-10-24 DIAGNOSIS — Y92009 Unspecified place in unspecified non-institutional (private) residence as the place of occurrence of the external cause: Secondary | ICD-10-CM | POA: Diagnosis not present

## 2019-10-24 DIAGNOSIS — E785 Hyperlipidemia, unspecified: Secondary | ICD-10-CM | POA: Diagnosis present

## 2019-10-24 DIAGNOSIS — Z01818 Encounter for other preprocedural examination: Secondary | ICD-10-CM

## 2019-10-24 DIAGNOSIS — Z6841 Body Mass Index (BMI) 40.0 and over, adult: Secondary | ICD-10-CM | POA: Diagnosis not present

## 2019-10-24 DIAGNOSIS — S8992XA Unspecified injury of left lower leg, initial encounter: Secondary | ICD-10-CM | POA: Diagnosis not present

## 2019-10-24 DIAGNOSIS — J9600 Acute respiratory failure, unspecified whether with hypoxia or hypercapnia: Secondary | ICD-10-CM | POA: Diagnosis not present

## 2019-10-24 DIAGNOSIS — R4701 Aphasia: Secondary | ICD-10-CM | POA: Diagnosis not present

## 2019-10-24 DIAGNOSIS — E870 Hyperosmolality and hypernatremia: Secondary | ICD-10-CM | POA: Diagnosis not present

## 2019-10-24 DIAGNOSIS — I1310 Hypertensive heart and chronic kidney disease without heart failure, with stage 1 through stage 4 chronic kidney disease, or unspecified chronic kidney disease: Secondary | ICD-10-CM | POA: Diagnosis present

## 2019-10-24 DIAGNOSIS — G9341 Metabolic encephalopathy: Secondary | ICD-10-CM | POA: Diagnosis not present

## 2019-10-24 DIAGNOSIS — D6489 Other specified anemias: Secondary | ICD-10-CM | POA: Diagnosis present

## 2019-10-24 DIAGNOSIS — M25562 Pain in left knee: Secondary | ICD-10-CM | POA: Diagnosis not present

## 2019-10-24 DIAGNOSIS — N1832 Chronic kidney disease, stage 3b: Secondary | ICD-10-CM

## 2019-10-24 DIAGNOSIS — R4781 Slurred speech: Secondary | ICD-10-CM

## 2019-10-24 DIAGNOSIS — Z20822 Contact with and (suspected) exposure to covid-19: Secondary | ICD-10-CM | POA: Diagnosis not present

## 2019-10-24 DIAGNOSIS — E7220 Disorder of urea cycle metabolism, unspecified: Secondary | ICD-10-CM | POA: Diagnosis not present

## 2019-10-24 DIAGNOSIS — J9690 Respiratory failure, unspecified, unspecified whether with hypoxia or hypercapnia: Secondary | ICD-10-CM | POA: Diagnosis not present

## 2019-10-24 DIAGNOSIS — Z794 Long term (current) use of insulin: Secondary | ICD-10-CM

## 2019-10-24 DIAGNOSIS — I6201 Nontraumatic acute subdural hemorrhage: Secondary | ICD-10-CM | POA: Diagnosis not present

## 2019-10-24 DIAGNOSIS — Z4682 Encounter for fitting and adjustment of non-vascular catheter: Secondary | ICD-10-CM | POA: Diagnosis not present

## 2019-10-24 DIAGNOSIS — W1830XA Fall on same level, unspecified, initial encounter: Secondary | ICD-10-CM | POA: Diagnosis not present

## 2019-10-24 DIAGNOSIS — E1165 Type 2 diabetes mellitus with hyperglycemia: Secondary | ICD-10-CM | POA: Diagnosis present

## 2019-10-24 DIAGNOSIS — M25552 Pain in left hip: Secondary | ICD-10-CM | POA: Diagnosis not present

## 2019-10-24 DIAGNOSIS — E876 Hypokalemia: Secondary | ICD-10-CM | POA: Diagnosis not present

## 2019-10-24 DIAGNOSIS — E875 Hyperkalemia: Secondary | ICD-10-CM | POA: Diagnosis present

## 2019-10-24 DIAGNOSIS — Z978 Presence of other specified devices: Secondary | ICD-10-CM

## 2019-10-24 DIAGNOSIS — R918 Other nonspecific abnormal finding of lung field: Secondary | ICD-10-CM | POA: Diagnosis not present

## 2019-10-24 DIAGNOSIS — I6200 Nontraumatic subdural hemorrhage, unspecified: Secondary | ICD-10-CM | POA: Diagnosis not present

## 2019-10-24 DIAGNOSIS — J9601 Acute respiratory failure with hypoxia: Secondary | ICD-10-CM | POA: Diagnosis not present

## 2019-10-24 DIAGNOSIS — R52 Pain, unspecified: Secondary | ICD-10-CM | POA: Diagnosis not present

## 2019-10-24 DIAGNOSIS — G4733 Obstructive sleep apnea (adult) (pediatric): Secondary | ICD-10-CM | POA: Diagnosis not present

## 2019-10-24 DIAGNOSIS — R531 Weakness: Secondary | ICD-10-CM

## 2019-10-24 DIAGNOSIS — R2689 Other abnormalities of gait and mobility: Secondary | ICD-10-CM

## 2019-10-24 DIAGNOSIS — Z7984 Long term (current) use of oral hypoglycemic drugs: Secondary | ICD-10-CM | POA: Diagnosis not present

## 2019-10-24 DIAGNOSIS — S065X0D Traumatic subdural hemorrhage without loss of consciousness, subsequent encounter: Secondary | ICD-10-CM | POA: Diagnosis not present

## 2019-10-24 DIAGNOSIS — J90 Pleural effusion, not elsewhere classified: Secondary | ICD-10-CM | POA: Diagnosis not present

## 2019-10-24 DIAGNOSIS — Z9289 Personal history of other medical treatment: Secondary | ICD-10-CM

## 2019-10-24 DIAGNOSIS — W19XXXA Unspecified fall, initial encounter: Secondary | ICD-10-CM | POA: Diagnosis not present

## 2019-10-24 DIAGNOSIS — S79912A Unspecified injury of left hip, initial encounter: Secondary | ICD-10-CM | POA: Diagnosis not present

## 2019-10-24 DIAGNOSIS — Z4659 Encounter for fitting and adjustment of other gastrointestinal appliance and device: Secondary | ICD-10-CM

## 2019-10-24 DIAGNOSIS — I129 Hypertensive chronic kidney disease with stage 1 through stage 4 chronic kidney disease, or unspecified chronic kidney disease: Secondary | ICD-10-CM | POA: Diagnosis not present

## 2019-10-24 DIAGNOSIS — Z781 Physical restraint status: Secondary | ICD-10-CM

## 2019-10-24 DIAGNOSIS — E119 Type 2 diabetes mellitus without complications: Secondary | ICD-10-CM | POA: Diagnosis not present

## 2019-10-24 DIAGNOSIS — E878 Other disorders of electrolyte and fluid balance, not elsewhere classified: Secondary | ICD-10-CM | POA: Diagnosis not present

## 2019-10-24 LAB — COMPREHENSIVE METABOLIC PANEL
ALT: 14 U/L (ref 0–44)
AST: 15 U/L (ref 15–41)
Albumin: 3.7 g/dL (ref 3.5–5.0)
Alkaline Phosphatase: 38 U/L (ref 38–126)
Anion gap: 13 (ref 5–15)
BUN: 66 mg/dL — ABNORMAL HIGH (ref 8–23)
CO2: 19 mmol/L — ABNORMAL LOW (ref 22–32)
Calcium: 9.3 mg/dL (ref 8.9–10.3)
Chloride: 104 mmol/L (ref 98–111)
Creatinine, Ser: 2.03 mg/dL — ABNORMAL HIGH (ref 0.61–1.24)
GFR calc Af Amer: 36 mL/min — ABNORMAL LOW (ref >60)
GFR calc non Af Amer: 31 mL/min — ABNORMAL LOW (ref >60)
Glucose, Bld: 136 mg/dL — ABNORMAL HIGH (ref 70–99)
Potassium: 5.4 mmol/L — ABNORMAL HIGH (ref 3.5–5.1)
Sodium: 136 mmol/L (ref 135–145)
Total Bilirubin: 0.4 mg/dL (ref 0.3–1.2)
Total Protein: 7.6 g/dL (ref 6.5–8.1)

## 2019-10-24 LAB — CBC WITH DIFFERENTIAL/PLATELET
Abs Immature Granulocytes: 0.11 10*3/uL — ABNORMAL HIGH (ref 0.00–0.07)
Basophils Absolute: 0 10*3/uL (ref 0.0–0.1)
Basophils Relative: 0 %
Eosinophils Absolute: 0 10*3/uL (ref 0.0–0.5)
Eosinophils Relative: 0 %
HCT: 31.3 % — ABNORMAL LOW (ref 39.0–52.0)
Hemoglobin: 9.7 g/dL — ABNORMAL LOW (ref 13.0–17.0)
Immature Granulocytes: 1 %
Lymphocytes Relative: 15 %
Lymphs Abs: 2.6 10*3/uL (ref 0.7–4.0)
MCH: 26.5 pg (ref 26.0–34.0)
MCHC: 31 g/dL (ref 30.0–36.0)
MCV: 85.5 fL (ref 80.0–100.0)
Monocytes Absolute: 1.1 10*3/uL — ABNORMAL HIGH (ref 0.1–1.0)
Monocytes Relative: 6 %
Neutro Abs: 13.4 10*3/uL — ABNORMAL HIGH (ref 1.7–7.7)
Neutrophils Relative %: 78 %
Platelets: 371 10*3/uL (ref 150–400)
RBC: 3.66 MIL/uL — ABNORMAL LOW (ref 4.22–5.81)
RDW: 15.1 % (ref 11.5–15.5)
WBC: 17.2 10*3/uL — ABNORMAL HIGH (ref 4.0–10.5)
nRBC: 0 % (ref 0.0–0.2)

## 2019-10-24 LAB — SARS CORONAVIRUS 2 BY RT PCR (HOSPITAL ORDER, PERFORMED IN ~~LOC~~ HOSPITAL LAB): SARS Coronavirus 2: NEGATIVE

## 2019-10-24 LAB — APTT: aPTT: 36 seconds (ref 24–36)

## 2019-10-24 LAB — PROTIME-INR
INR: 1.1 (ref 0.8–1.2)
Prothrombin Time: 14.4 seconds (ref 11.4–15.2)

## 2019-10-24 IMAGING — CT CT HEAD W/O CM
3 series · 14 of 47 positions shown, 16 images · non-contrast
Comparison: [DATE]
COMPARISON: [DATE]

Addendum:
CLINICAL DATA: Bilateral arm and leg weakness.

EXAM:
CT HEAD WITHOUT CONTRAST
TECHNIQUE: Contiguous axial images were obtained from the base of the skull
through the vertex without intravenous contrast.

[Series 2: head wo · axial · 0.48mm/px · z∈[-190,-40]mm · 8 of 36 slices shown, 10 images]
[im 3/36  brain]
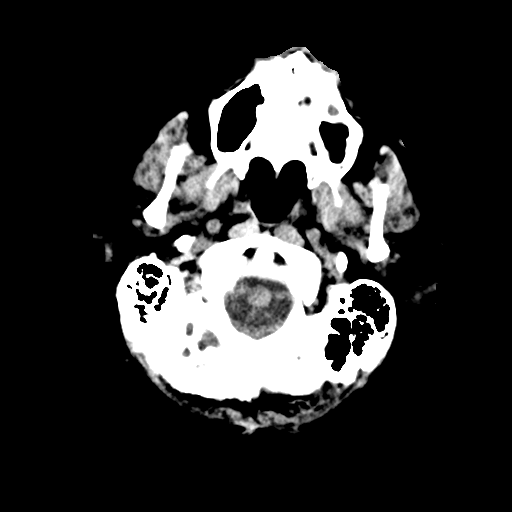
[im 3/36  bone]
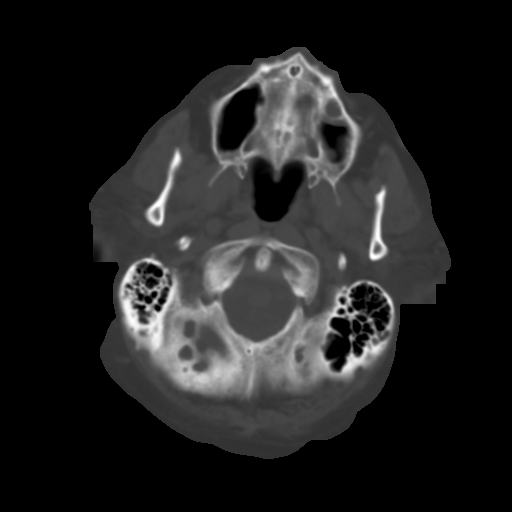
[im 8/36  brain]
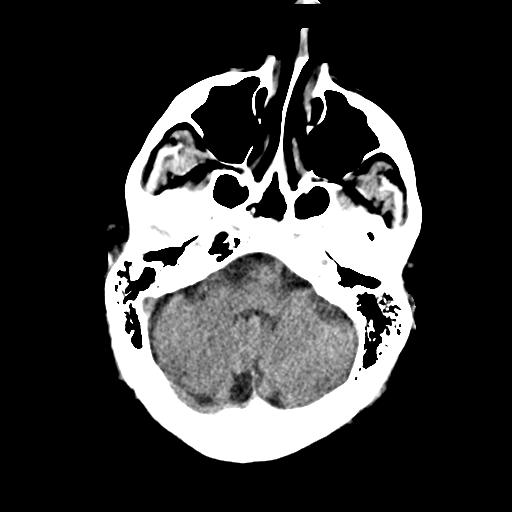
[im 11/36  brain]
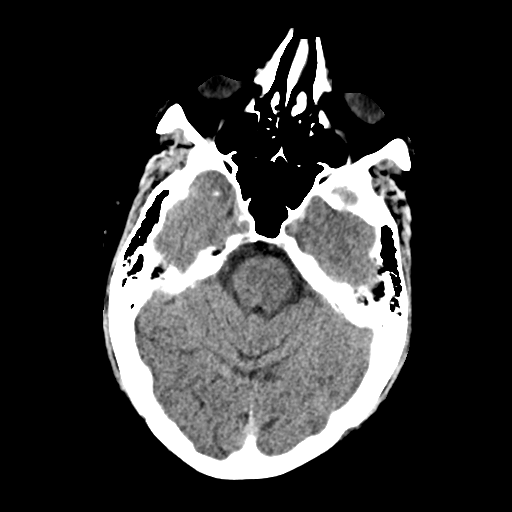
[im 16/36  brain]
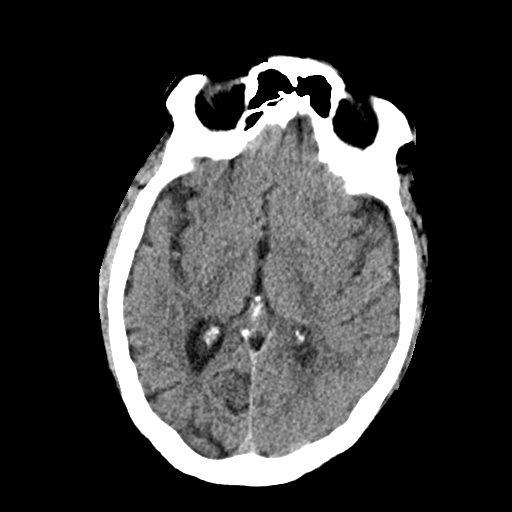
[im 20/36  brain]
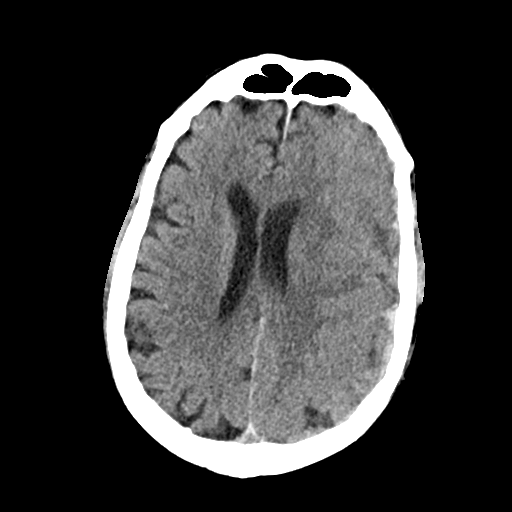
[im 20/36  bone]
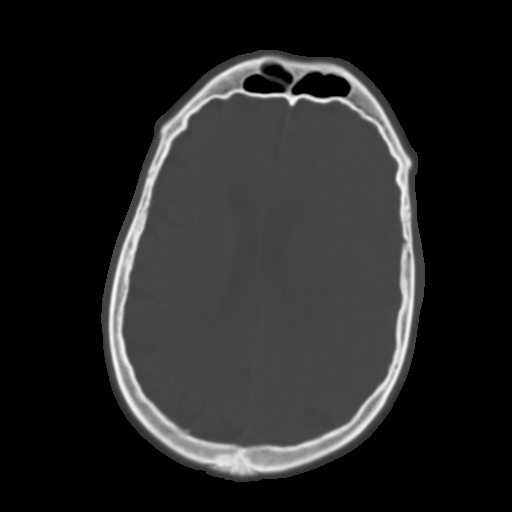
[im 25/36  brain]
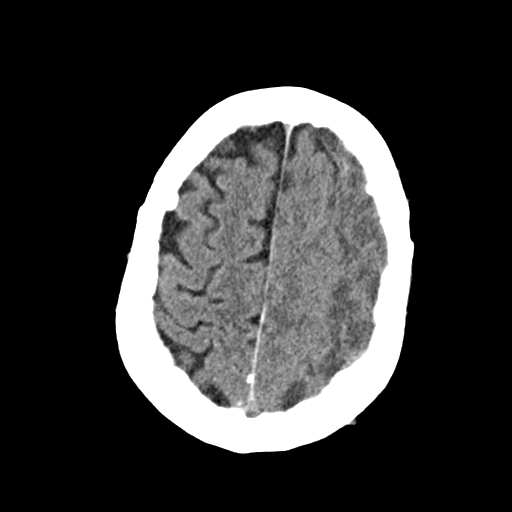
[im 28/36  brain]
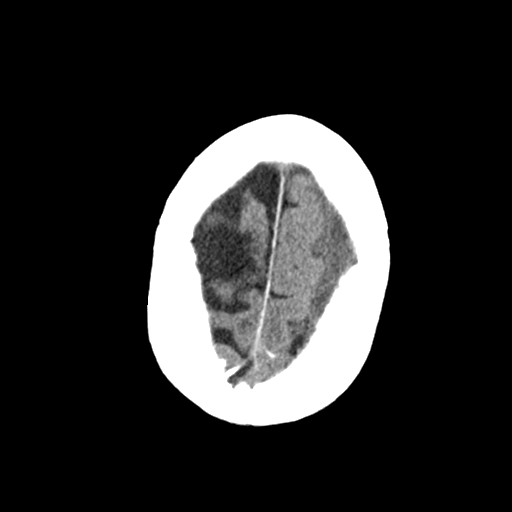
[im 33/36  brain]
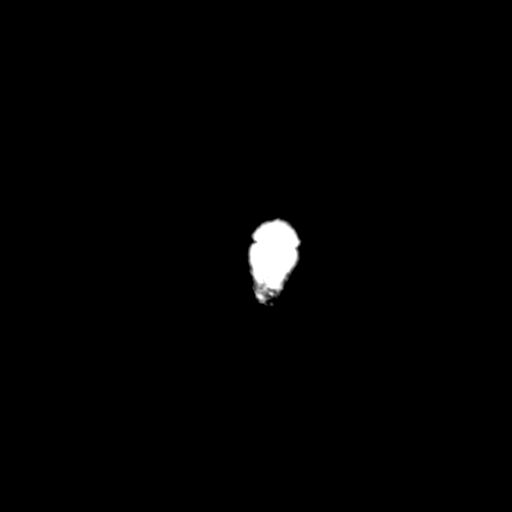

[Series 4: coronal soft · coronal · 0.34mm/px · 3 of 74 slices shown]
[im 25/74  brain]
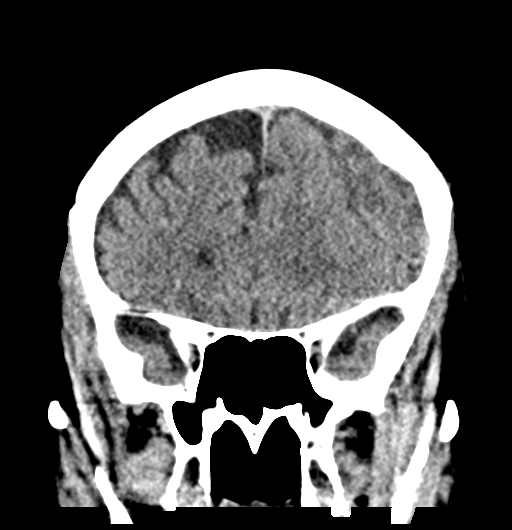
[im 33/74  brain]
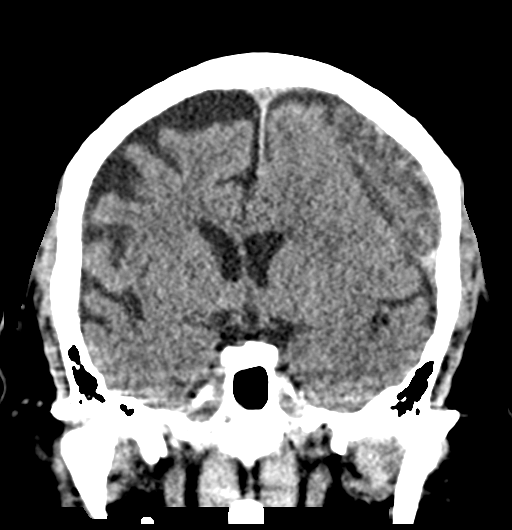
[im 41/74  brain]
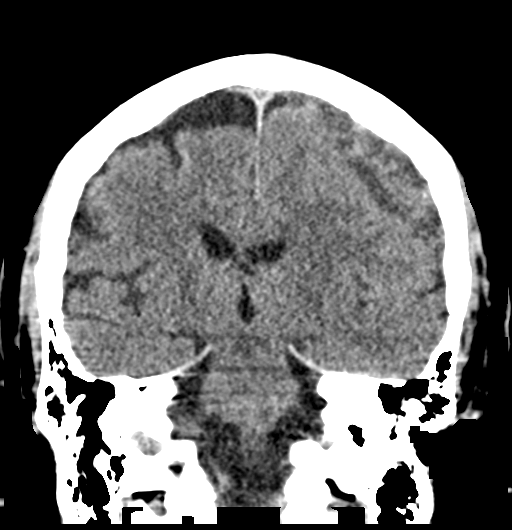

[Series 5: sag soft · sagittal · 0.36mm/px · 3 of 56 slices shown]
[im 19/56  brain]
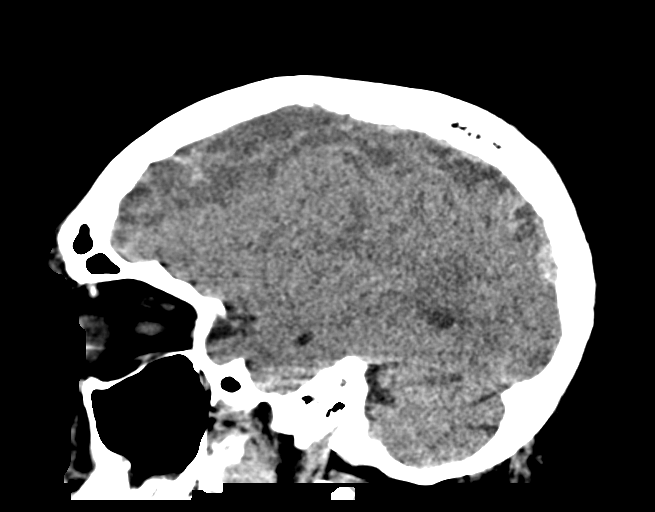
[im 28/56  brain]
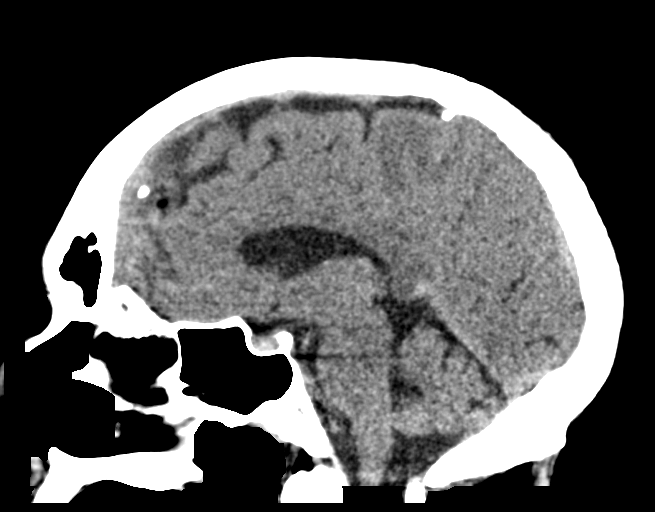
[im 37/56  brain]
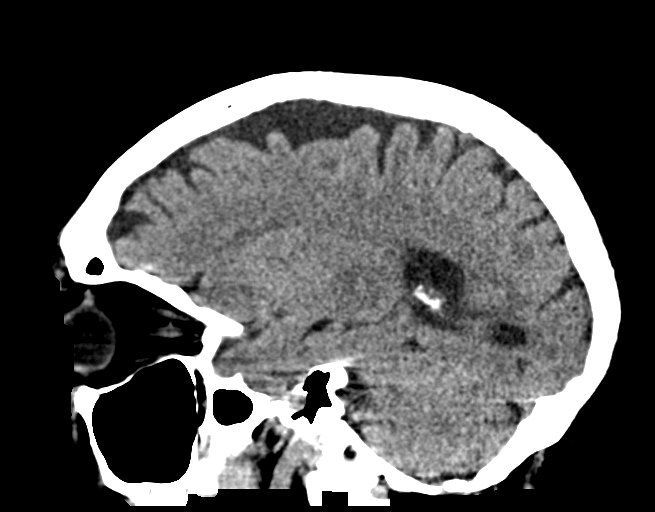

[14 of 47 positions shown; findings below may reference images not displayed]

FINDINGS: Brain: A large, acute on subacute/chronic, predominantly left
frontal lobe subdural collection is seen. This area measures
approximately 2.3 cm in maximum thickness and extends along the
vertex. Mild involvement of the left parietal and left occipital
regions is noted. Mass effect is seen on the adjacent sulci with
diffuse sulcal effacement noted throughout the left hemisphere.
There is approximately 5.53 mm left to right midline shift.

There is mild cerebral atrophy with widening of the extra-axial
spaces and ventricular dilatation.

There are areas of decreased attenuation within the white matter
tracts of the supratentorial brain, consistent with microvascular
disease changes.

Vascular: No hyperdense vessel or unexpected calcification.

Skull: Normal. Negative for fracture or focal lesion.

Sinuses/Orbits: No acute finding.

Other: None.
IMPRESSION: 1. Large, acute on subacute/chronic, predominantly left frontal lobe
subdural collection, measuring up to 2.3 cm in maximum thickness.
MRI correlation is recommended
2. Mass effect is seen on the adjacent sulci with approximately
mm left to right midline shift.
3. Chronic microvascular disease changes of the supratentorial
brain.
4. Mild cerebral atrophy with widening of the extra-axial spaces and
ventricular dilatation.

ADDENDUM:
Results were discussed with Dr. ROSENBLATT at [DATE] p.m. [place]
on [DATE].

*** End of Addendum ***
FINDINGS: Brain: A large, acute on subacute/chronic, predominantly left
frontal lobe subdural collection is seen. This area measures
approximately 2.3 cm in maximum thickness and extends along the
vertex. Mild involvement of the left parietal and left occipital
regions is noted. Mass effect is seen on the adjacent sulci with
diffuse sulcal effacement noted throughout the left hemisphere.
There is approximately 5.53 mm left to right midline shift.

There is mild cerebral atrophy with widening of the extra-axial
spaces and ventricular dilatation.

There are areas of decreased attenuation within the white matter
tracts of the supratentorial brain, consistent with microvascular
disease changes.

Vascular: No hyperdense vessel or unexpected calcification.

Skull: Normal. Negative for fracture or focal lesion.

Sinuses/Orbits: No acute finding.

Other: None.
IMPRESSION: 1. Large, acute on subacute/chronic, predominantly left frontal lobe
subdural collection, measuring up to 2.3 cm in maximum thickness.
MRI correlation is recommended
2. Mass effect is seen on the adjacent sulci with approximately
mm left to right midline shift.
3. Chronic microvascular disease changes of the supratentorial
brain.
4. Mild cerebral atrophy with widening of the extra-axial spaces and
ventricular dilatation.

## 2019-10-24 IMAGING — DX DG HIP (WITH OR WITHOUT PELVIS) 2-3V*L*
4 series · 4 of 4 positions shown · non-contrast
Comparison: None.

CLINICAL DATA: Fall

EXAM:
DG HIP (WITH OR WITHOUT PELVIS) 2-3V LEFT

[pelvis ap]
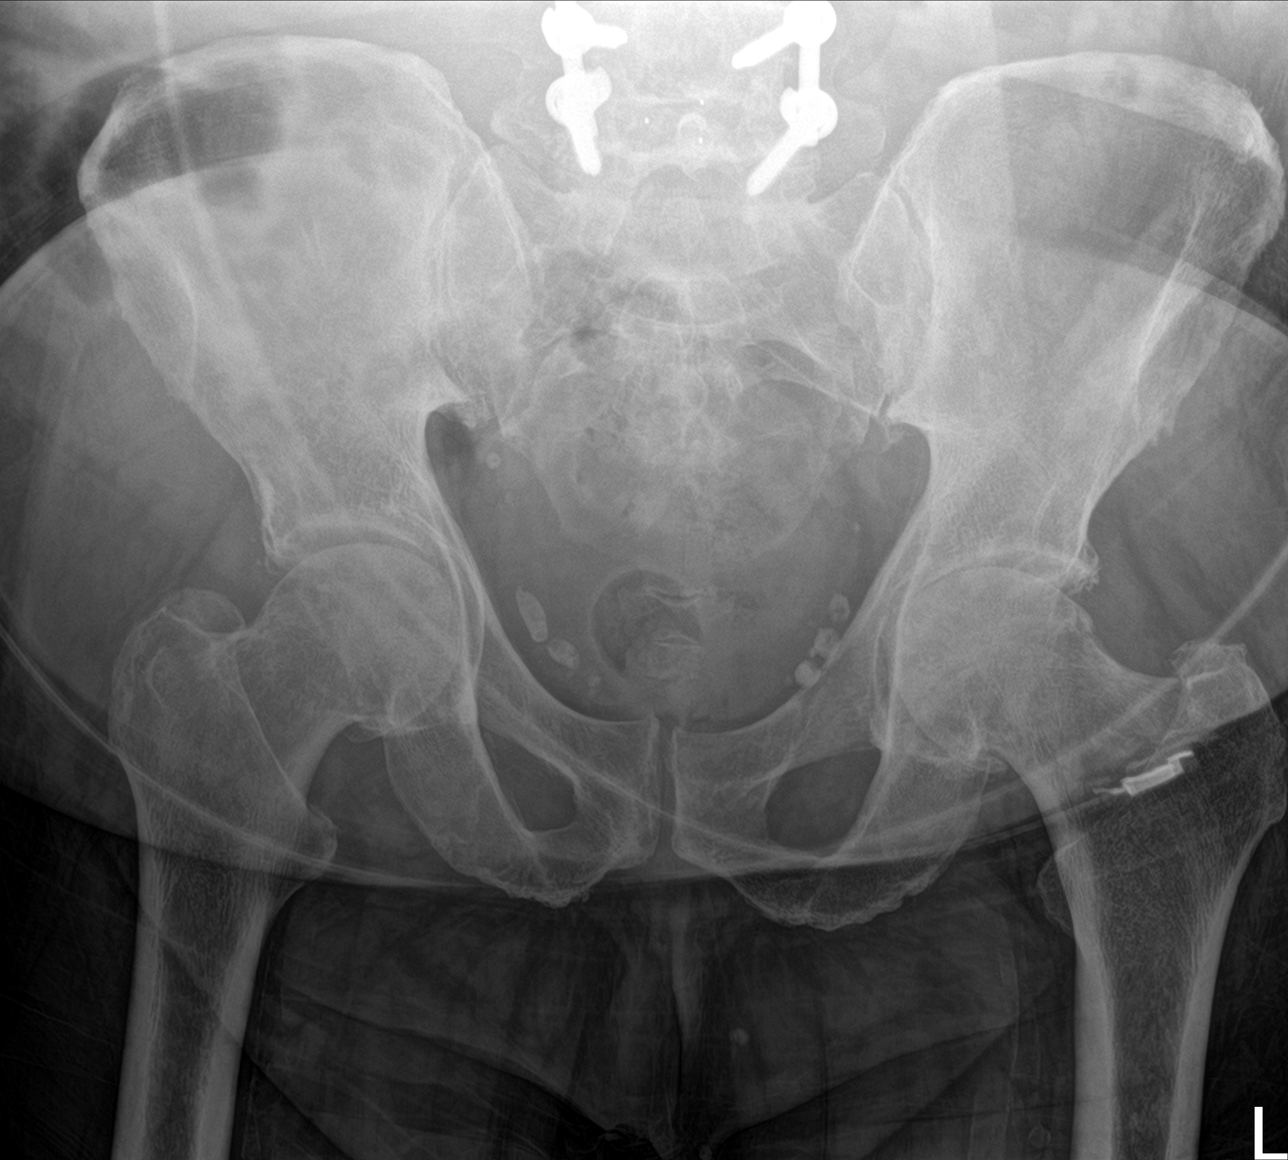

[hip ap (1 of 2)]
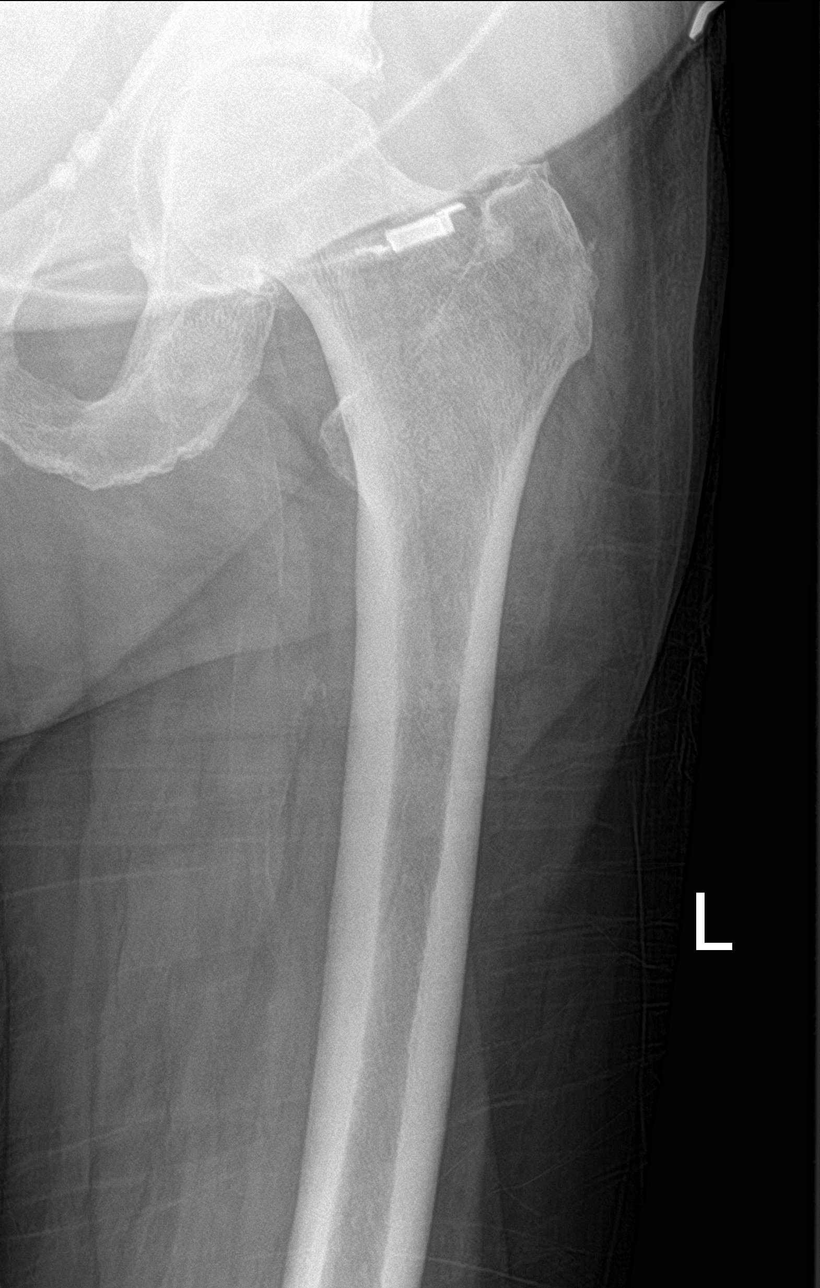

[hip ap (2 of 2)]
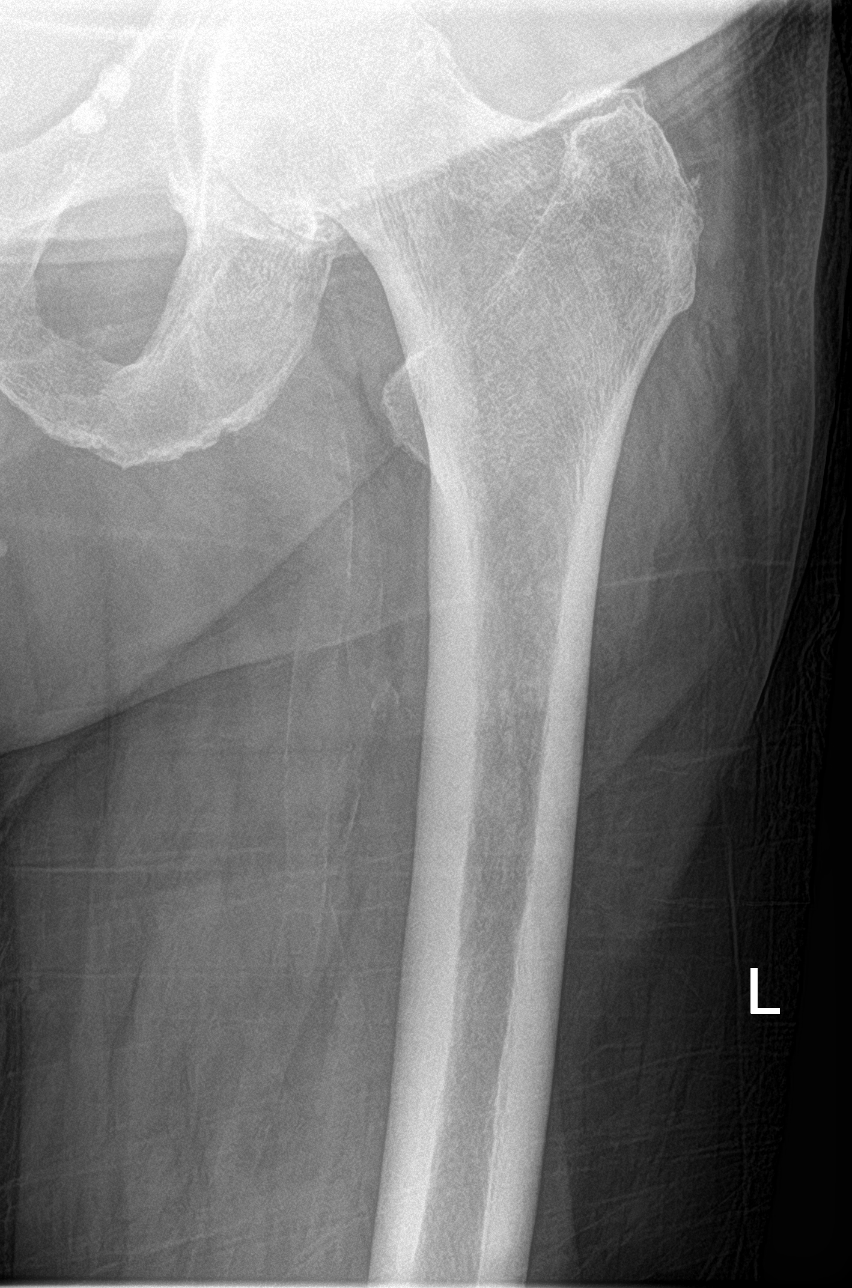

[hip lat]
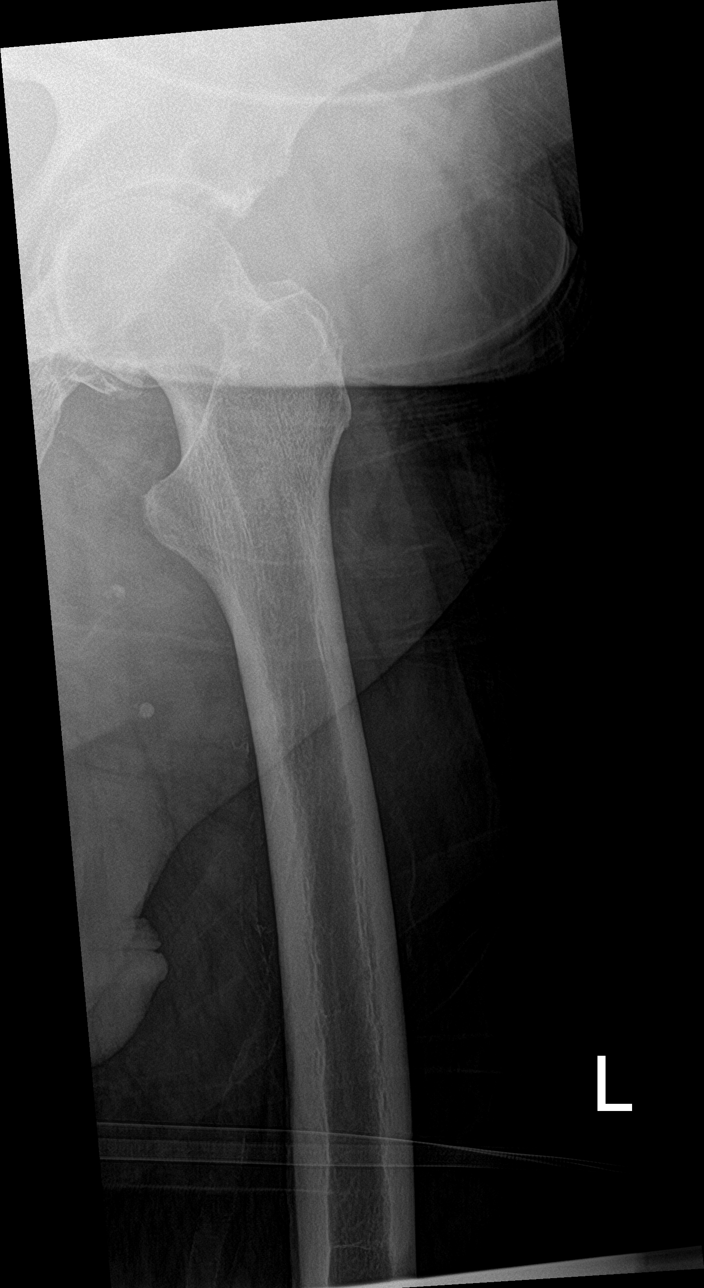

[4 of 4 positions shown; findings below may reference images not displayed]

FINDINGS: There is no acute displaced fracture or dislocation. There are
advanced degenerative changes of both hips, left worse than right.
Phleboliths project over the patient's pelvis. Vascular
calcifications are noted.
IMPRESSION: Advanced degenerative changes of both hips, left worse than right.
No acute displaced fracture or dislocation.

## 2019-10-24 IMAGING — CT CT CERVICAL SPINE W/O CM
3 of 5 series · 12 of 35 positions shown, 14 images · non-contrast
Comparison: None.

CLINICAL DATA: Leg weakness for 2 weeks, known subdural hematoma

EXAM:
CT CERVICAL SPINE WITHOUT CONTRAST
TECHNIQUE: Multidetector CT imaging of the cervical spine was performed without
intravenous contrast. Multiplanar CT image reconstructions were also
generated.

[Series 4: sagittal bone · sagittal · 0.28mm/px · 5 of 66 slices shown, 6 images]
[im 22/66  bone]
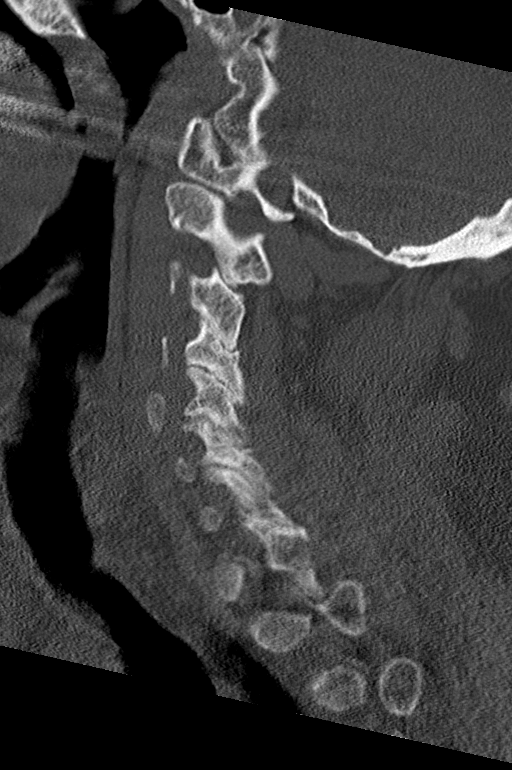
[im 28/66  bone]
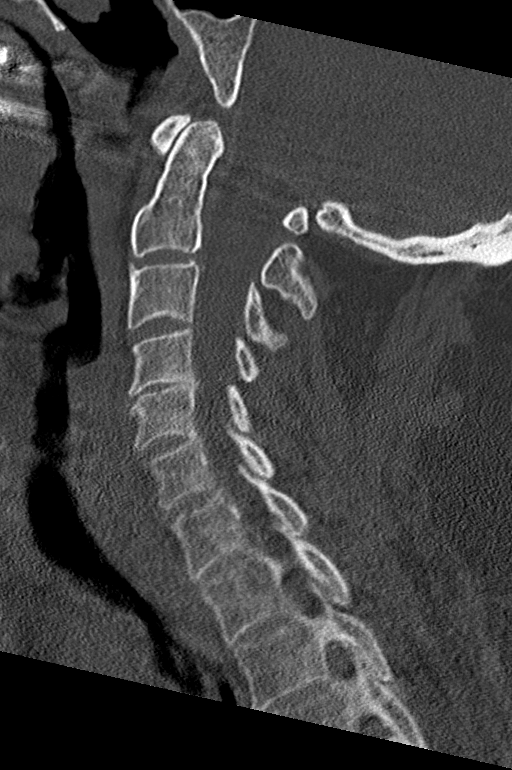
[im 33/66  soft-tissue]
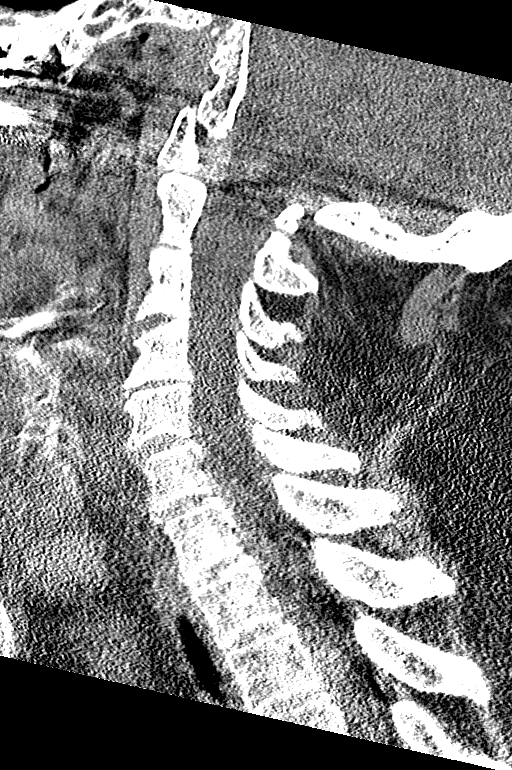
[im 33/66  bone]
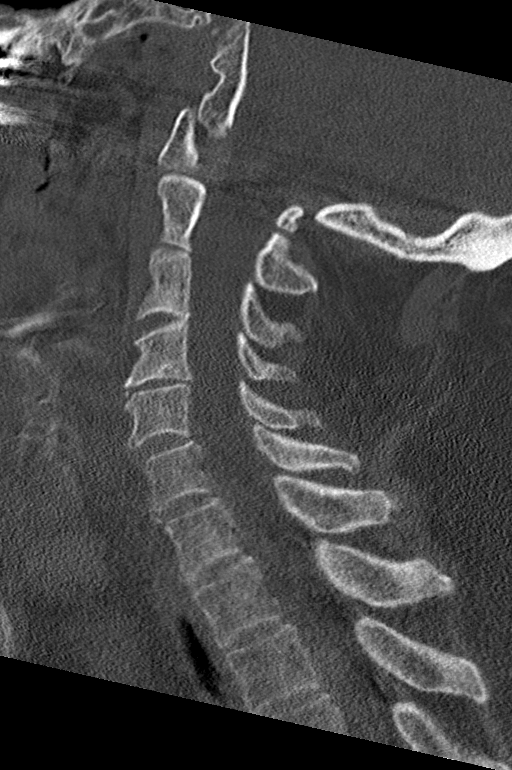
[im 38/66  bone]
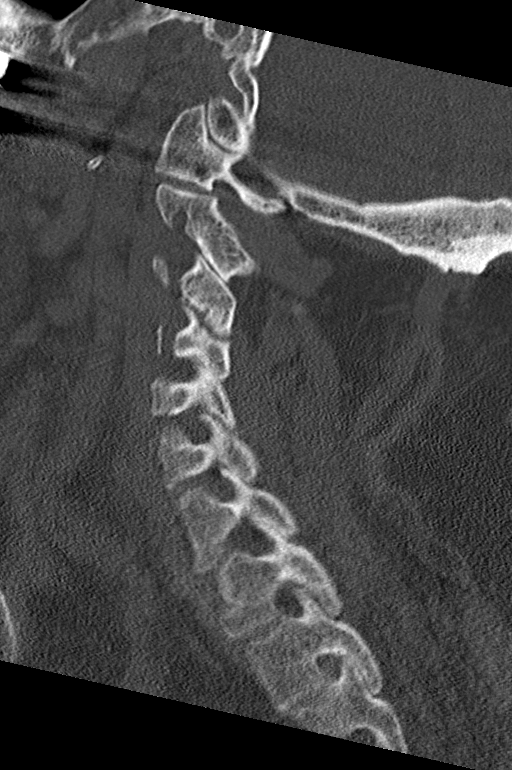
[im 44/66  bone]
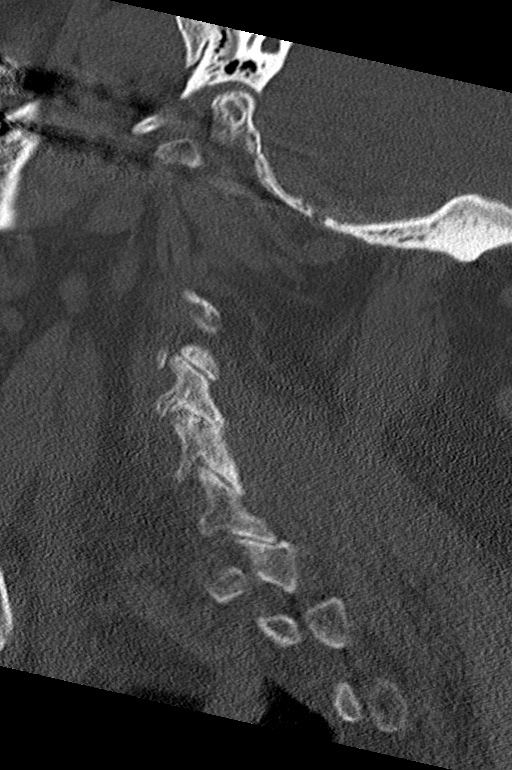

[Series 5: coronal bone · coronal · 0.27mm/px · 3 of 66 slices shown]
[im 14/66  bone]
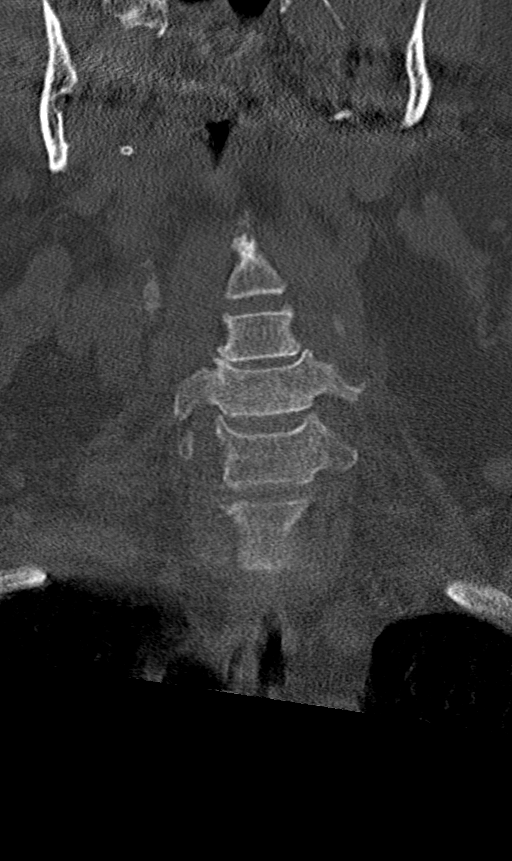
[im 27/66  bone]
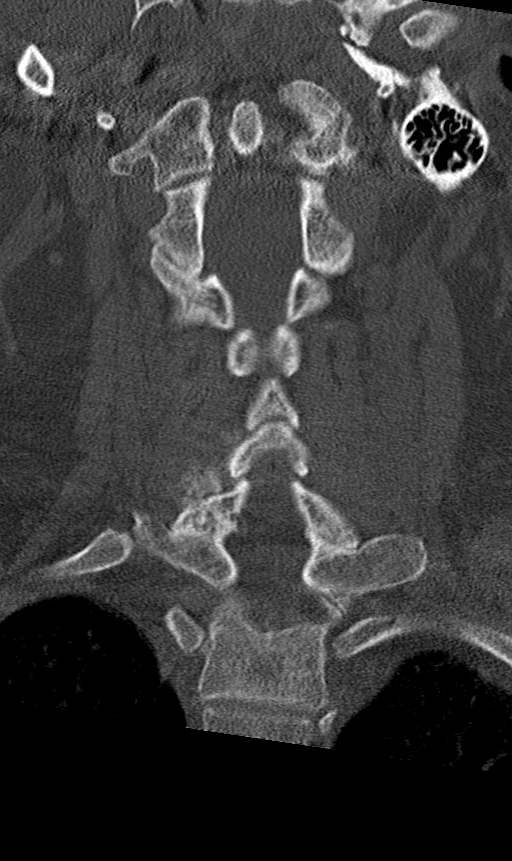
[im 39/66  bone]
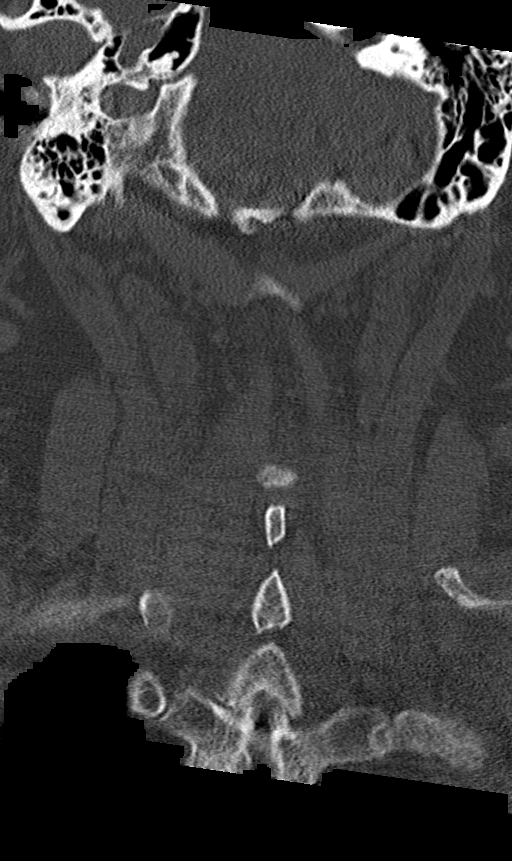

[Series 6: orthogonal bone · axial · 0.23mm/px · z∈[-293,-173]mm · 4 of 107 slices shown, 5 images]
[im 22/107  soft-tissue]
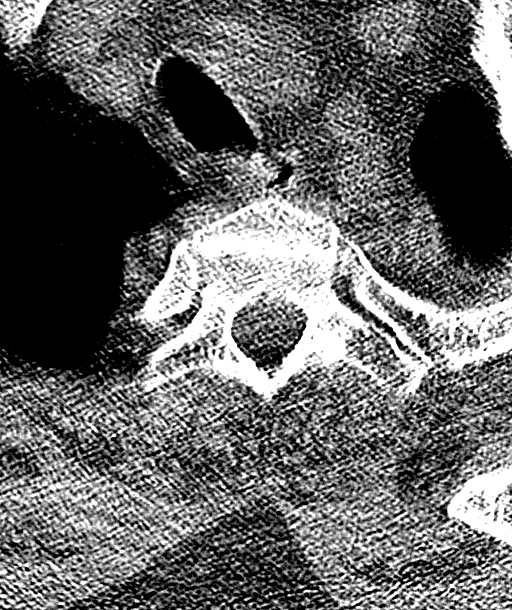
[im 22/107  bone]
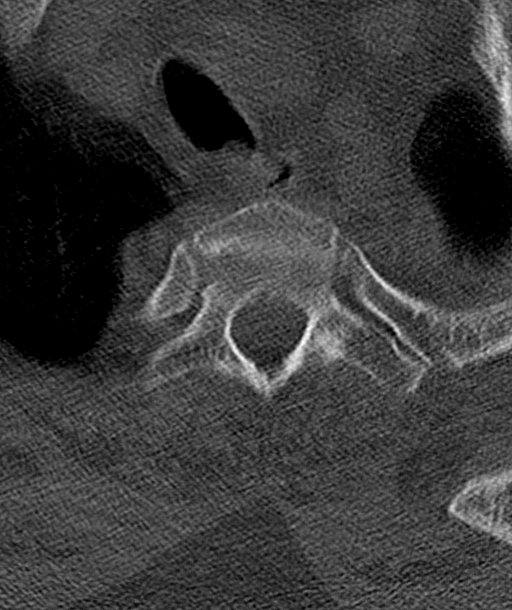
[im 43/107  bone]
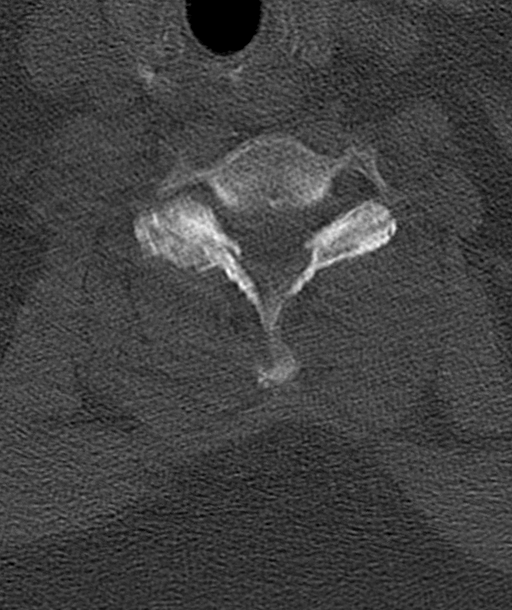
[im 64/107  bone]
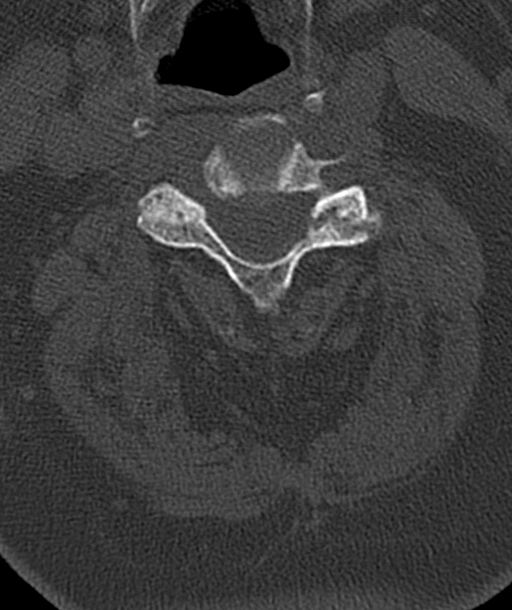
[im 85/107  bone]
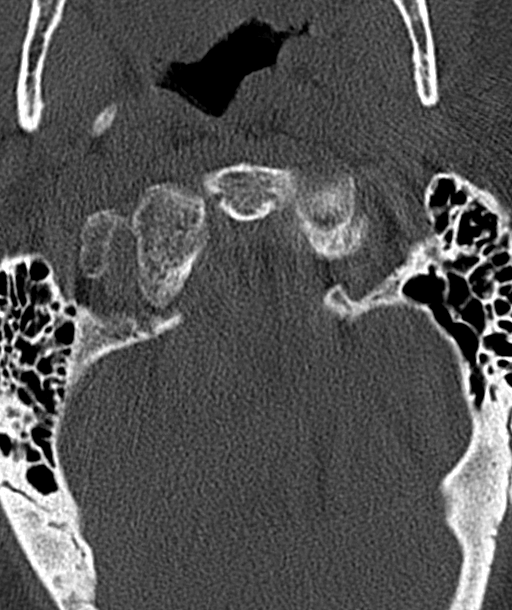

[12 of 35 positions shown; findings below may reference images not displayed]

FINDINGS: Alignment: Within normal limits.

Skull base and vertebrae: 7 cervical segments are well visualized.
Vertebral body height is well maintained. Facet hypertrophic changes
are noted. No acute fracture or acute facet abnormality is noted.
Osteophytic changes are noted throughout the cervical spine.

Soft tissues and spinal canal: Surrounding soft tissue structures
are within normal limits. No significant disc pathology is seen.

Upper chest: Negative.

Other: None.
IMPRESSION: Multilevel degenerative change without acute abnormality.

## 2019-10-24 IMAGING — DX DG KNEE COMPLETE 4+V*L*
4 series · 4 of 4 positions shown · non-contrast
Comparison: None.

CLINICAL DATA: Pain status post fall

EXAM:
LEFT KNEE - COMPLETE 4+ VIEW

[knee ap]
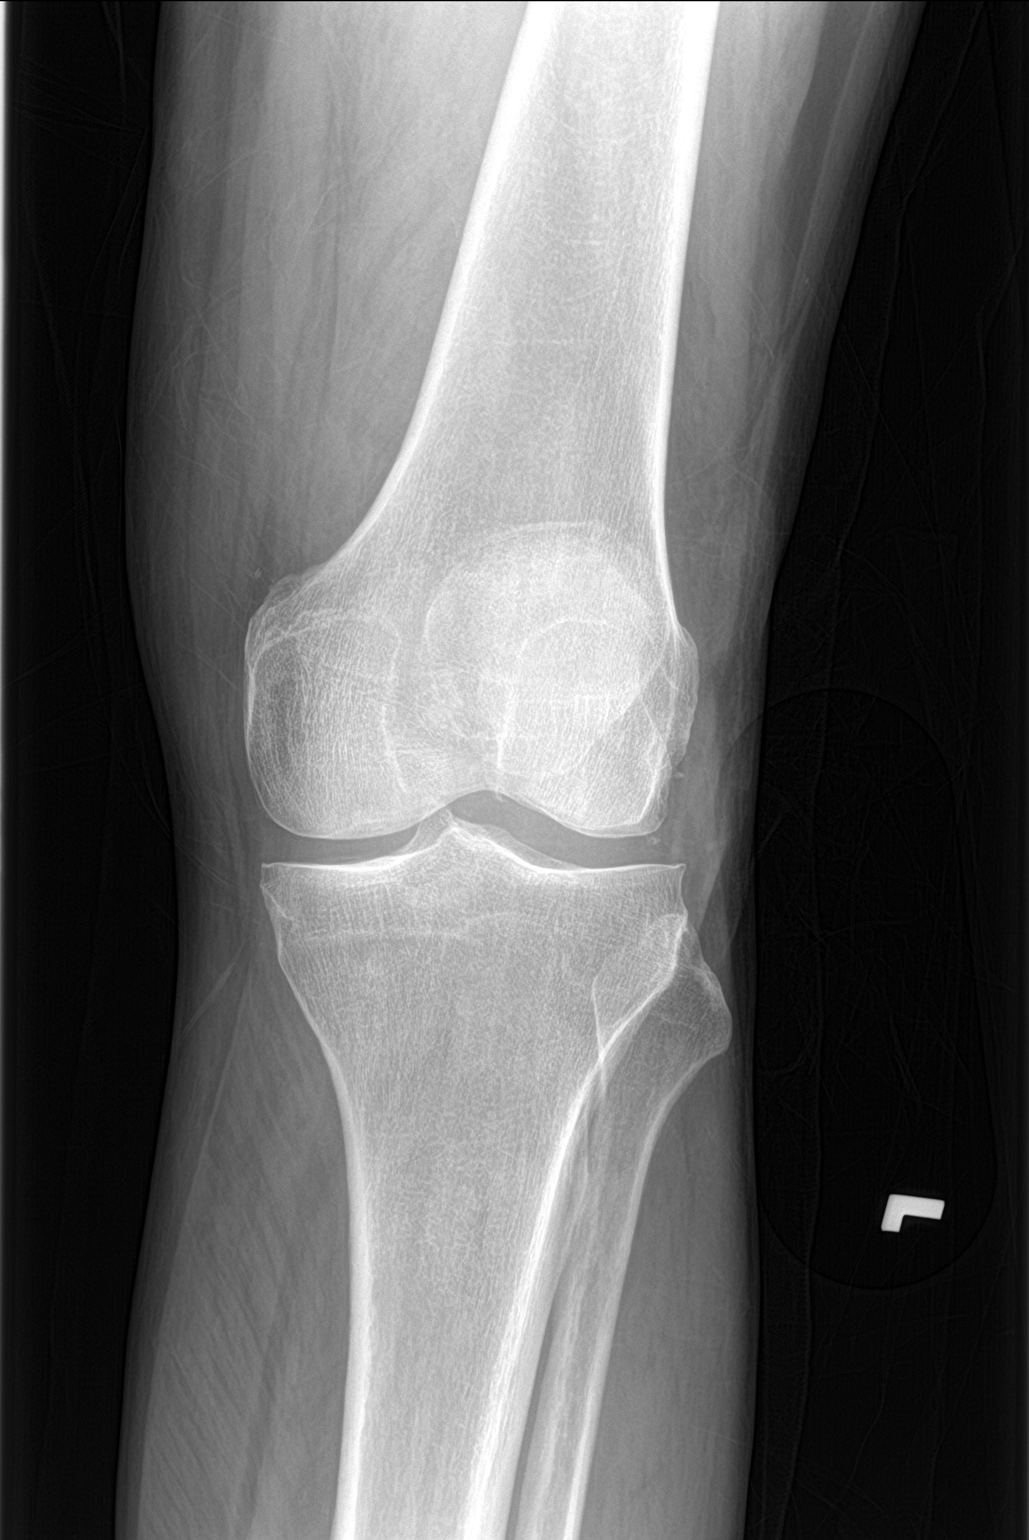

[knee lat]
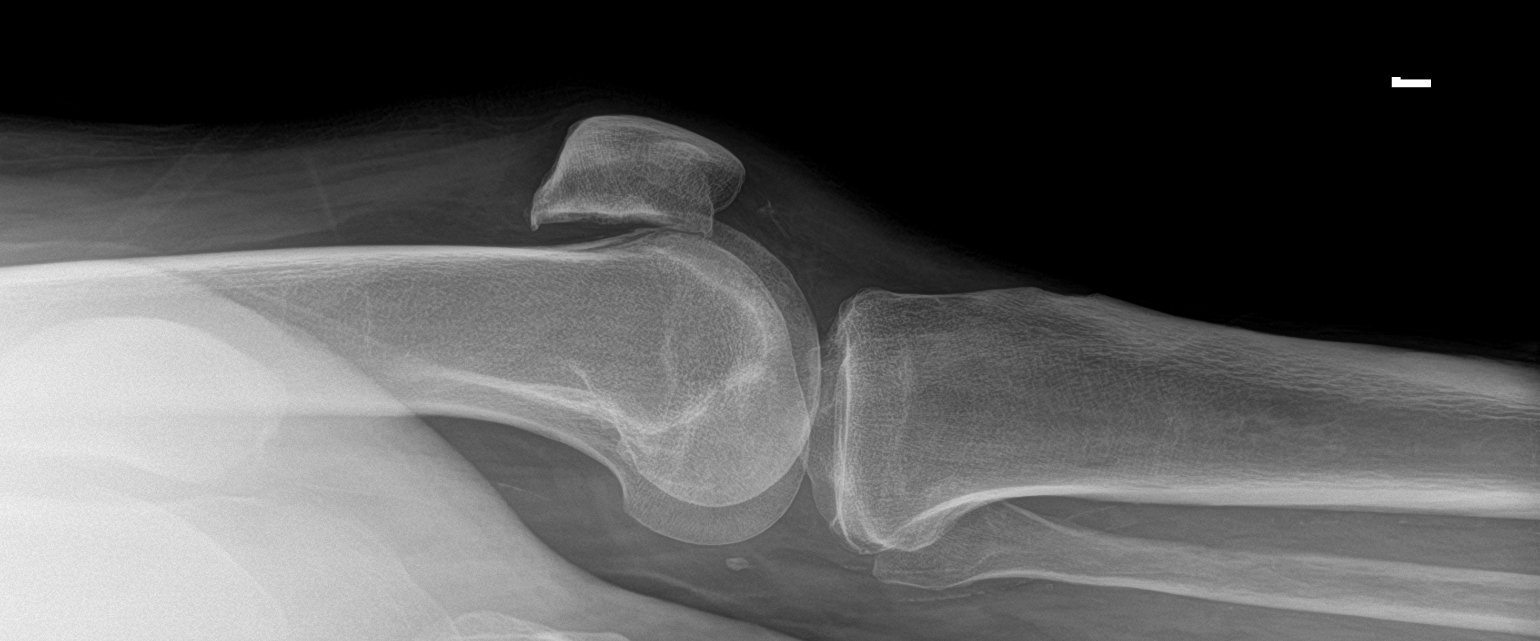

[knee obl (1 of 2)]
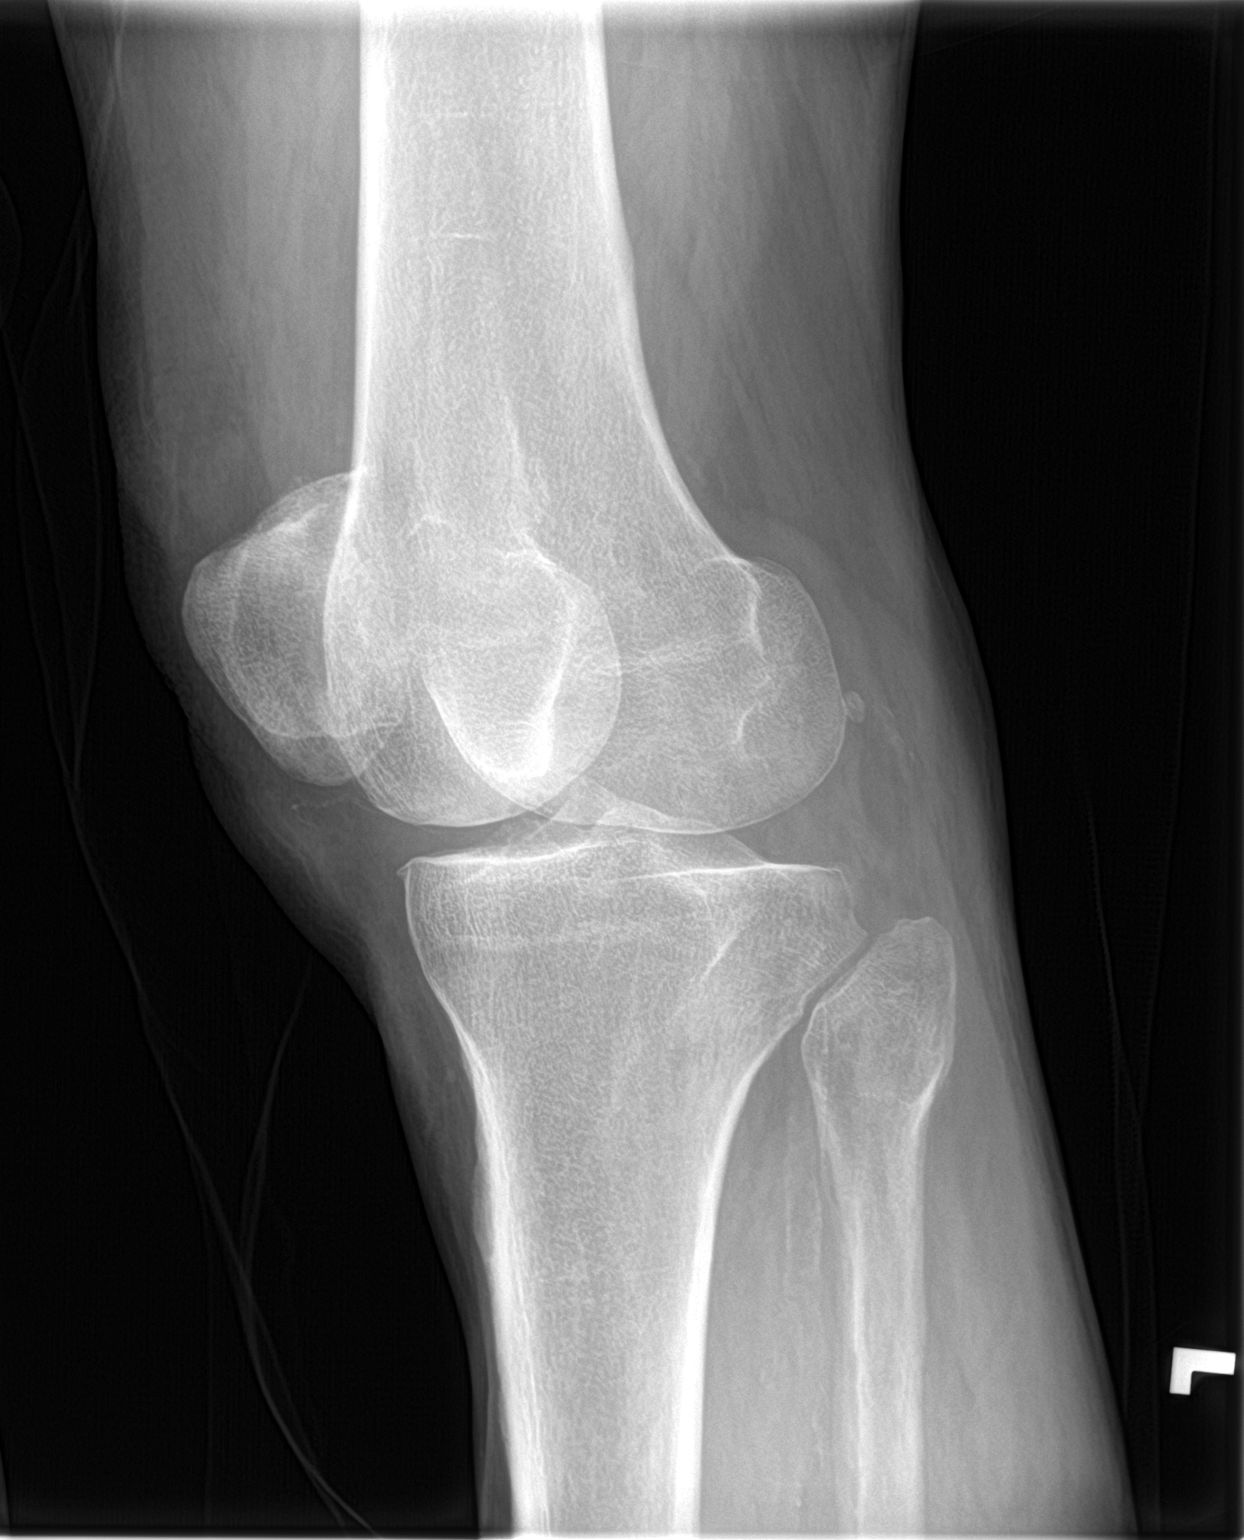

[knee obl (2 of 2)]
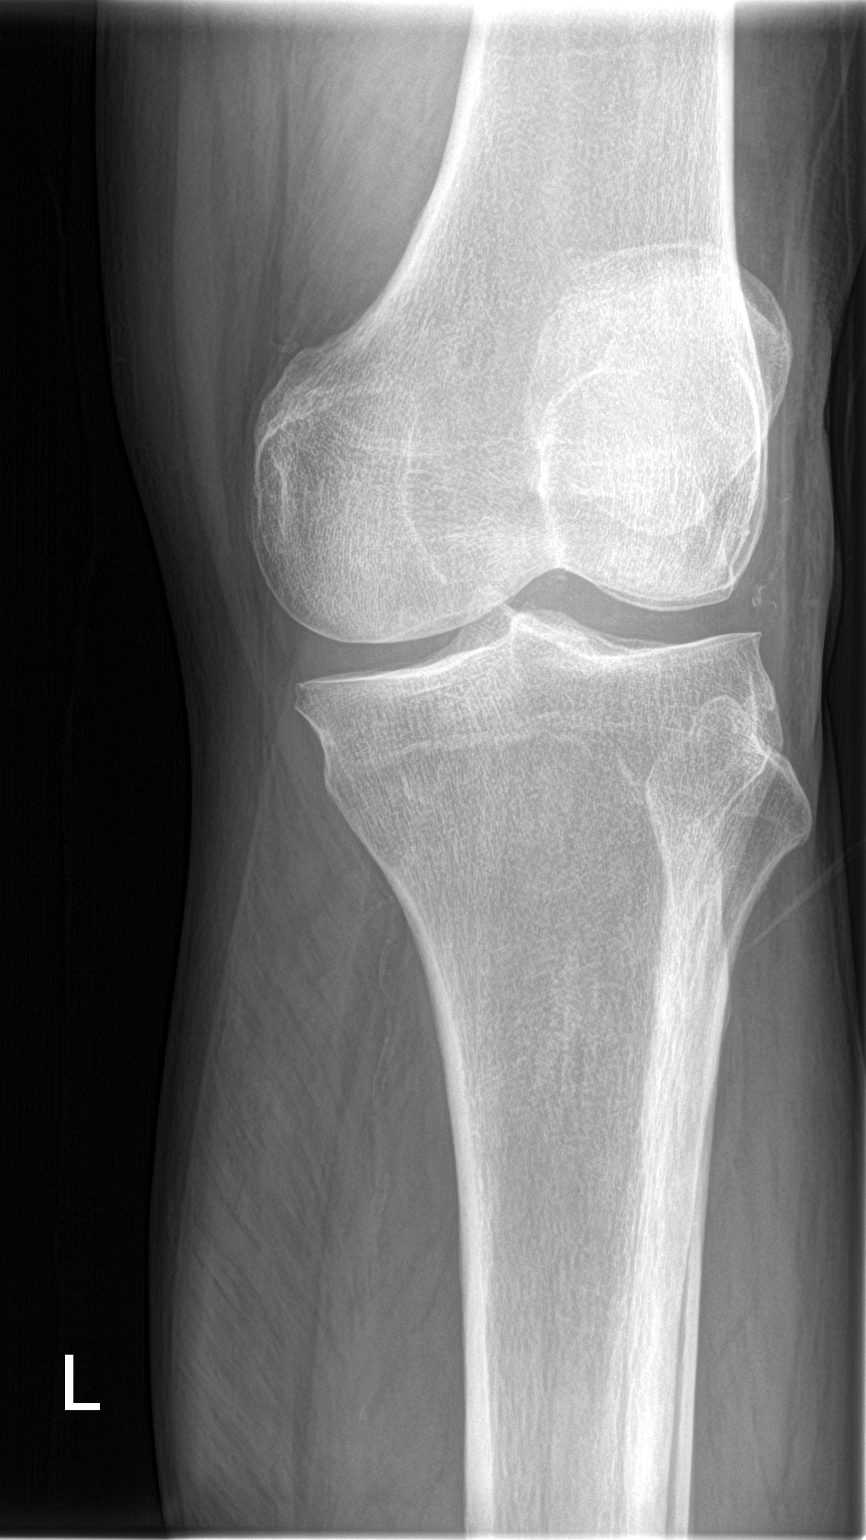

[4 of 4 positions shown; findings below may reference images not displayed]

FINDINGS: There are tricompartmental degenerative changes. There is no
displaced fracture. No dislocation. No significant joint effusion.
IMPRESSION: Tricompartmental degenerative changes without acute bony
abnormality. No significant joint effusion.

## 2019-10-24 MED ORDER — FENTANYL CITRATE (PF) 100 MCG/2ML IJ SOLN
50.0000 ug | Freq: Once | INTRAMUSCULAR | Status: DC
  Filled 2019-10-24: qty 2

## 2019-10-24 MED ORDER — FENTANYL CITRATE (PF) 100 MCG/2ML IJ SOLN
50.0000 ug | Freq: Once | INTRAMUSCULAR | Status: AC
  Administered 2019-10-24: 18:00:00 50 ug via INTRAVENOUS
  Filled 2019-10-24: qty 2

## 2019-10-24 MED ORDER — LEVETIRACETAM IN NACL 500 MG/100ML IV SOLN
500.0000 mg | Freq: Two times a day (BID) | INTRAVENOUS | Status: DC
  Administered 2019-10-24 – 2019-11-04 (×22): 500 mg via INTRAVENOUS
  Filled 2019-10-24 (×21): qty 100

## 2019-10-24 MED ORDER — FENTANYL CITRATE (PF) 100 MCG/2ML IJ SOLN
50.0000 ug | Freq: Once | INTRAMUSCULAR | Status: AC
  Administered 2019-10-24: 50 ug via INTRAVENOUS

## 2019-10-24 MED ORDER — FENTANYL CITRATE (PF) 100 MCG/2ML IJ SOLN
50.0000 ug | Freq: Once | INTRAMUSCULAR | Status: AC
  Administered 2019-10-24: 19:00:00 50 ug via INTRAVENOUS
  Filled 2019-10-24: qty 2

## 2019-10-24 NOTE — Telephone Encounter (Signed)
Pt's wife stated pt fell again this morning and she had to had EMS come out and pick him up. She is wanting to request a CT for pt prior to his appt with Dr. Posey Pronto because she feels that is too long to wait as pt could have another stroke. She would like a callback from Dr. Irene Limbo nurse/CMA. Please advise.

## 2019-10-24 NOTE — Telephone Encounter (Signed)
Pt's wife called to report that the patient fell again and is in tremendous pain, She is taking him to the ER. She is requesting that PCP place orders for CAT scan.

## 2019-10-24 NOTE — Telephone Encounter (Signed)
I called back the patients wife and did let her know of test PCP has ordered. She is currently waiting on her son to arrive at their home to help her get him in the car to go to the ER as he is in tremendous hip/leg pain from the fall. She had to call a neighbor to help her get him from the floor to the bed. I did inform her if anything changes to call EMS asap to get him to the ED. The wife verbalized understanding//agreed to do so.

## 2019-10-24 NOTE — ED Notes (Signed)
Pt returned from X Ray.

## 2019-10-24 NOTE — ED Notes (Signed)
PT restless and moaning in pain. VSS. MD informed

## 2019-10-24 NOTE — ED Notes (Signed)
ED Provider at bedside. 

## 2019-10-24 NOTE — ED Notes (Signed)
Pt  Waiting peacefully in waiting room, in no distress.

## 2019-10-24 NOTE — ED Notes (Signed)
Son with patient at bedside.

## 2019-10-24 NOTE — ED Triage Notes (Signed)
Per EMS:  Pt sent here by PCP for bilateral leg weakness x 2 weeks.  Pt was referred to neurologist, but is not available anytime soon.   Pt having bilateral arm and leg weakness and some memory issues.

## 2019-10-24 NOTE — ED Provider Notes (Signed)
[facility] EMERGENCY DEPARTMENT Provider Note   CSN: [id number] Arrival date & time: 10/24/19  1524     History Chief Complaint  Patient presents with  . Weakness    Tommy Green is a 74 y.o. male.  Patient is a 74 year old male who has a history of prior stroke, diabetes and hyperlipidemia as well as hypertension.  He presents with weakness and slurred speech.  History is obtained from his wife and son as patient is currently confused.  Per his wife he has had a few falls recently.  He had a fall on New Year's Eve and his wife states he had a fall about a week prior to that but she does not know exactly what day.  She also does not know what led to the falls.  At times apparently the patient gets dizzy and falls.  He also has recently been dealing with various joint pains and took a course of steroids with some improvement.  This past Sunday which was 5 days ago, the son and the wife noted that he was having some slurred speech and difficulty with his words.  Since that time, most notably 2 days ago, the wife noted that he was having trouble feeding himself with his right hand.  He would put stuff on his fork with his left hand and then put it in his mouth.  He saw his doctor 2 days ago who was concerned the patient may have had a stroke and was trying to get him referred to a neurologist but they were unable to get him in until January 15.  Patient had another fall today and the decision was made to bring him to the emergency room.  The wife says that she has noted more weakness on his right side.  Patient is also been confused over the last several days.  He has not had any reported injuries from the fall.  History is limited due to patient's confusion.  Reportedly, patient had no confusion prior to the last week.  He is not on anticoagulants.        Past Medical History:  Diagnosis Date  . Anemia   . Essential hypertension 07/18/2017  . History of rheumatic fever   .  History of stroke 07/18/2017  . Hypertriglyceridemia 07/18/2017  . Type 2 diabetes mellitus with diabetic neuropathy, unspecified ([place]) 07/18/2017    Patient Active Problem List   Diagnosis Date Noted  . Subdural hematoma ([place]) 10/24/2019  . Chronic pain of right knee 10/03/2019  . Cyclic vomiting syndrome 08/06/2019  . Neuropathy 12/25/2018  . Bilateral hip pain 08/24/2018  . AK (actinic keratosis) 08/24/2018  . Neoplasm of uncertain behavior 0000000  . 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 Tendinopathy of right gluteus medius 07/13/2018  . Tendinopathy of left gluteus medius 07/13/2018  . Squamous cell carcinoma in situ (SCCIS) of skin 03/24/2018  . Skin lesion 03/17/2018  . Essential hypertension 07/18/2017  . Hyperlipidemia associated with type 2 diabetes mellitus ([place]) 07/18/2017  . Type 2 diabetes mellitus with diabetic neuropathy, unspecified ([place]) 07/18/2017  . Heart murmur 07/18/2017  . Hypertriglyceridemia 07/18/2017  . History of stroke 07/18/2017  . Pain in joint, ankle and foot 02/06/2013    Past Surgical History:  Procedure Laterality Date  . FOOT CAPSULOTOMY Left 07/25/2008   Mid Foot #2 MPJ  . Hammertoe Repair Left 07/25/2008   #2 toe  . SPINAL FUSION    . TARSAL TUNNEL RELEASE Left 07/25/2008  Family History  Problem Relation Age of Onset  . Hyperlipidemia Mother   . Hyperlipidemia Father   . Cancer Neg Hx     Social History   Tobacco Use  . Smoking status: Former Smoker    Types: Cigarettes  . Smokeless tobacco: Never Used  . Tobacco comment: Quit in 1973  Substance Use Topics  . Alcohol use: No  . Drug use: No    Home Medications Prior to Admission medications   Medication Sig Start Date End Date Taking? Authorizing Provider  carvedilol (COREG) 12.5 MG tablet TAKE 1 TABLET TWICE DAILY  WITH MEALS 02/21/19   Wendling, Crosby Oyster, DO  clobetasol cream (TEMOVATE) [id number] % Apply 1 application topically 2 (two) times daily. 07/13/18   [name], DO  fenofibrate micronized (LOFIBRA) 200 MG capsule TAKE 1 CAPSULE DAILY BEFOREBREAKFAST 02/21/19   [name], DO  gabapentin (NEURONTIN) 600 MG tablet TAKE 1 TABLET 3 TIMES A DAY 08/06/19   [name], DO  hydrochlorothiazide (HYDRODIURIL) 25 MG tablet TAKE 1 TABLET DAILY 02/12/19   Wendling, Crosby Oyster, DO  Insulin Aspart (NOVOLOG [place]) Sliding scale    [provider]  insulin degludec (TRESIBA) 100 UNIT/ML SOPN FlexTouch Pen Inject 0.6 mLs (60 Units total) into the skin at bedtime. 09/26/19   [name], DO  lisinopril (ZESTRIL) 40 MG tablet TAKE 1 TABLET DAILY 02/21/19   [name], DO  lovastatin (MEVACOR) 10 MG tablet Take 1 tablet (10 mg total) by mouth at bedtime. 10/23/19   [name], DO  metFORMIN (GLUCOPHAGE) 500 MG tablet Take 500 mg by mouth 2 (two) times daily with a meal.    [provider]  Multiple Vitamins-Minerals (MULTIVITAMIN WITH MINERALS) tablet Take 1 tablet by mouth daily.    [provider]  vitamin B-12 (CYANOCOBALAMIN) 1000 MCG tablet Take 1,000 mcg by mouth daily.    [provider]    Allergies    Influenza vaccines, Aspirin, Atorvastatin, Canagliflozin, and Statins  Review of Systems   Review of Systems  Unable to perform ROS: Mental status change    Physical Exam Updated Vital Signs BP 125/87 (BP Location: Right Arm)   Pulse 93   Temp 98.6 F (37 C)   Resp 19   Ht 5\' 4"  (1.626 m)   SpO2 97%   BMI 44.80 kg/m   Physical Exam Constitutional:      Appearance: He is well-developed.  HENT:     Head: Normocephalic and atraumatic.     Comments: No bony tenderness to the face Eyes:     Pupils: Pupils are equal, round, and reactive to light.  Neck:     Comments: No pain in the cervical, thoracic or lumbosacral spine Cardiovascular:     Rate and Rhythm: Normal rate and regular rhythm.     Heart sounds: Murmur present.  Pulmonary:      Effort: Pulmonary effort is normal. No respiratory distress.     Breath sounds: Normal breath sounds. No wheezing or rales.  Chest:     Chest wall: No tenderness.  Abdominal:     General: Bowel sounds are normal.     Palpations: Abdomen is soft.     Tenderness: There is no abdominal tenderness. There is no guarding or rebound.  Musculoskeletal:        General: Normal range of motion.     Comments: Positive tenderness on range of motion of the left hip.  There is some  minimal tenderness on palpation of the left knee.  Pedal pulses are intact.  There is no pain on palpation or range of motion of the other extremities  Lymphadenopathy:     Cervical: No cervical adenopathy.  Skin:    General: Skin is warm and dry.     Findings: No rash.  Neurological:     Mental Status: He is alert.     Comments: Patient is oriented to person only.  He is able to clearly tell me his name although when I ask him other questions, he has "garbled" speech and seems to have an aphasia.  His speech does not make sense.  He is able to say words but they do not make sense.  He does not have any obvious facial drooping.  He does have some drooping of his left eyelid which his son says has been there in the past although it is a little more pronounced.  He has weakness in his left arm as compared to his right.  He seems to have weakness in both of his lower extremities and cannot raise either against gravity but he seems to be a little weaker on the left side.     ED Results / Procedures / Treatments   Labs (all labs ordered are listed, but only abnormal results are displayed) Labs Reviewed  CBC WITH DIFFERENTIAL/PLATELET - Abnormal; Notable for the following components:      Result Value   WBC 17.2 (*)    RBC 3.66 (*)    Hemoglobin 9.7 (*)    HCT 31.3 (*)    Neutro Abs 13.4 (*)    Monocytes Absolute 1.1 (*)    Abs Immature Granulocytes 0.11 (*)    All other components within normal limits  COMPREHENSIVE  METABOLIC PANEL - Abnormal; Notable for the following components:   Potassium 5.4 (*)    CO2 19 (*)    Glucose, Bld 136 (*)    BUN 66 (*)    Creatinine, Ser 2.03 (*)    GFR calc non Af Amer 31 (*)    GFR calc Af Amer 36 (*)    All other components within normal limits  SARS CORONAVIRUS 2 BY RT PCR (HOSPITAL ORDER, [facility])  SARS CORONAVIRUS 2 (TAT 6-24 HRS)  PROTIME-INR  APTT    EKG EKG Interpretation  Date/Time:  Wednesday October 24 2019 16:54:42 EST Ventricular Rate:  87 PR Interval:    QRS Duration: 98 QT Interval:  429 QTC Calculation: 517 R Axis:   11 Text Interpretation: probable sinus rhythm Borderline repolarization abnormality Prolonged QT interval Confirmed by Malvin Johns [phone]) on 10/24/2019 5:29:51 PM   Radiology CT Head Wo Contrast  Addendum Date: 10/24/2019   ADDENDUM REPORT: 10/24/2019 16:40 ADDENDUM: Results were discussed with Dr. Malvin Johns at 4:36 p.m. [place] on October 24, 2019. Electronically Signed   By: Virgina Norfolk M.D.   On: 10/24/2019 16:40   Result Date: 10/24/2019 CLINICAL DATA:  Bilateral arm and leg weakness. EXAM: CT HEAD WITHOUT CONTRAST TECHNIQUE: Contiguous axial images were obtained from the base of the skull through the vertex without intravenous contrast. COMPARISON:  December 12, 2015 FINDINGS: Brain: A large, acute on subacute/chronic, predominantly left frontal lobe subdural collection is seen. This area measures approximately 2.3 cm in maximum thickness and extends along the vertex. Mild involvement of the left parietal and left occipital regions is noted. Mass effect is seen on the adjacent sulci with diffuse sulcal effacement noted  throughout the left hemisphere. There is approximately 5.53 mm left to right midline shift. There is mild cerebral atrophy with widening of the extra-axial spaces and ventricular dilatation. There are areas of decreased attenuation within the white matter tracts of the  supratentorial brain, consistent with microvascular disease changes. Vascular: No hyperdense vessel or unexpected calcification. Skull: Normal. Negative for fracture or focal lesion. Sinuses/Orbits: No acute finding. Other: None. IMPRESSION: 1. Large, acute on subacute/chronic, predominantly left frontal lobe subdural collection, measuring up to 2.3 cm in maximum thickness. MRI correlation is recommended 2. Mass effect is seen on the adjacent sulci with approximately 5.53 mm left to right midline shift. 3. Chronic microvascular disease changes of the supratentorial brain. 4. Mild cerebral atrophy with widening of the extra-axial spaces and ventricular dilatation. Electronically Signed: By: Virgina Norfolk M.D. On: 10/24/2019 16:33   CT Cervical Spine Wo Contrast  Result Date: 10/24/2019 CLINICAL DATA:  Leg weakness for 2 weeks, known subdural hematoma EXAM: CT CERVICAL SPINE WITHOUT CONTRAST TECHNIQUE: Multidetector CT imaging of the cervical spine was performed without intravenous contrast. Multiplanar CT image reconstructions were also generated. COMPARISON:  None. FINDINGS: Alignment: Within normal limits. Skull base and vertebrae: 7 cervical segments are well visualized. Vertebral body height is well maintained. Facet hypertrophic changes are noted. No acute fracture or acute facet abnormality is noted. Osteophytic changes are noted throughout the cervical spine. Soft tissues and spinal canal: Surrounding soft tissue structures are within normal limits. No significant disc pathology is seen. Upper chest: Negative. Other: None. IMPRESSION: Multilevel degenerative change without acute abnormality. Electronically Signed   By: Inez Catalina M.D.   On: 10/24/2019 17:31   DG Knee Complete 4 Views Left  Result Date: 10/24/2019 CLINICAL DATA:  Pain status post fall EXAM: LEFT KNEE - COMPLETE 4+ VIEW COMPARISON:  None. FINDINGS: There are tricompartmental degenerative changes. There is no displaced fracture. No  dislocation. No significant joint effusion. IMPRESSION: Tricompartmental degenerative changes without acute bony abnormality. No significant joint effusion. Electronically Signed   By: Constance Holster M.D.   On: 10/24/2019 18:43   DG Hip Unilat W or Wo Pelvis 2-3 Views Left  Result Date: 10/24/2019 CLINICAL DATA:  Fall EXAM: DG HIP (WITH OR WITHOUT PELVIS) 2-3V LEFT COMPARISON:  None. FINDINGS: There is no acute displaced fracture or dislocation. There are advanced degenerative changes of both hips, left worse than right. Phleboliths project over the patient's pelvis. Vascular calcifications are noted. IMPRESSION: Advanced degenerative changes of both hips, left worse than right. No acute displaced fracture or dislocation. Electronically Signed   By: Constance Holster M.D.   On: 10/24/2019 18:42    Procedures Procedures (including critical care time)  Medications Ordered in ED Medications  levETIRAcetam (KEPPRA) IVPB 500 mg/100 mL premix ( Intravenous Stopped 10/24/19 1911)  fentaNYL (SUBLIMAZE) injection 50 mcg (50 mcg Intravenous Given 10/24/19 1734)  fentaNYL (SUBLIMAZE) injection 50 mcg (50 mcg Intravenous Given 10/24/19 1912)    ED Course  I have reviewed the triage vital signs and the nursing notes.  Pertinent labs & imaging results that were available during my care of the patient were reviewed by me and considered in my medical decision making (see chart for details).    MDM Rules/Calculators/A&P                      Patient is a 74 year old male who presents with confusion, aphasia and weakness.  He had reported weakness on the right side but on my exam  he seems more weak on the left side.  CT scan shows evidence of a subdural hematoma, acute on subacute.  There is 5 mm of midline shift.  He is alert and communicative but does seem to have an aphasia.  Is unclear whether his symptoms are all related to the subdural hematoma or potentially he had a recent stroke which led to the fall  which led to the subdural hematoma.  He is out of the window for any stroke treatment.  He is not on anticoagulants.  His labs are reviewed.  His creatinine is slightly increased as compared to his prior values.  His hemoglobin is slightly decreased..  I spoke with Dr. Marcello Moores with neurosurgery who has accepted the patient for admission.  He advised to keep patient n.p.o. for potential evacuation tomorrow.    22:00 I did notify Dr. Marcello Moores with neurosurgery that the patient will unlikely be transferred to [facility] given that there are no current ICU beds.  He is aware and request to be notified if there is any change in condition.  Patient was started on IV Keppra 500 mg twice daily per Dr. Manon Hilding request.  CRITICAL CARE Performed by: Malvin Johns Total critical care time: 60 minutes Critical care time was exclusive of separately billable procedures and treating other patients. Critical care was necessary to treat or prevent imminent or life-threatening deterioration. Critical care was time spent personally by me on the following activities: development of treatment plan with patient and/or surrogate as well as nursing, discussions with consultants, evaluation of patient's response to treatment, examination of patient, obtaining history from patient or surrogate, ordering and performing treatments and interventions, ordering and review of laboratory studies, ordering and review of radiographic studies, pulse oximetry and re-evaluation of patient's condition.  Final Clinical Impression(s) / ED Diagnoses Final diagnoses:  SDH (subdural hematoma) ([place])    Rx / DC Orders ED Discharge Orders    None       Malvin Johns, MD 10/24/19 [phone]

## 2019-10-24 NOTE — ED Notes (Signed)
Patient transported to CT 

## 2019-10-24 NOTE — Progress Notes (Signed)
I am ordering the CT scan of the head. They will probably do that. Once results come back if there is no bleeding, I will start 2 more medications until he can get in with the Neuro team.

## 2019-10-24 NOTE — ED Notes (Signed)
Pt agitated, c/o back and leg pain. Did urinate in urinal with assist. Son at bedside. Pt pulling at ekg leads, moaning. HOB elevated 30 degrees. Airway intact.

## 2019-10-24 NOTE — Progress Notes (Signed)
Have let the patients wife know.

## 2019-10-24 NOTE — Progress Notes (Signed)
Patient's imaging reviewed.  There is a chronic loculated left subdural hematoma with associated mass effect that would likely benefit from craniotomy for evacuation.  Transfer to [facility] has been requested.

## 2019-10-24 NOTE — Telephone Encounter (Signed)
Called Neurology at [phone] and spoke to rep informed of test to be ordered. She will get this information back to MD and call us back as quickly as possible with response.

## 2019-10-24 NOTE — Progress Notes (Signed)
I have ordered longer term workup for Tommy Green, a stat CT head was ordered as well. Ty.

## 2019-10-24 NOTE — Telephone Encounter (Signed)
Can we reach out to Neuro team to see if there is any imaging they would recommend I order prior to his appt. I am planning on getting MRI/MRA head, MRA neck and an echo if they have no request/recommendation. Ty.

## 2019-10-25 ENCOUNTER — Inpatient Hospital Stay (HOSPITAL_COMMUNITY): Payer: Medicare HMO | Admitting: Certified Registered Nurse Anesthetist

## 2019-10-25 ENCOUNTER — Encounter (HOSPITAL_COMMUNITY): Payer: Self-pay | Admitting: Neurosurgery

## 2019-10-25 ENCOUNTER — Inpatient Hospital Stay (HOSPITAL_COMMUNITY)
Admission: EM | Disposition: A | Discharge status: Skilled Nursing Facility | Payer: Self-pay | Source: Home / Self Care | Attending: Neurosurgery

## 2019-10-25 ENCOUNTER — Inpatient Hospital Stay (HOSPITAL_COMMUNITY): Payer: Medicare HMO

## 2019-10-25 HISTORY — PX: CRANIOTOMY: SHX93

## 2019-10-25 LAB — BASIC METABOLIC PANEL
Anion gap: 13 (ref 5–15)
Anion gap: 14 (ref 5–15)
Anion gap: 17 — ABNORMAL HIGH (ref 5–15)
BUN: 64 mg/dL — ABNORMAL HIGH (ref 8–23)
BUN: 66 mg/dL — ABNORMAL HIGH (ref 8–23)
BUN: 68 mg/dL — ABNORMAL HIGH (ref 8–23)
CO2: 14 mmol/L — ABNORMAL LOW (ref 22–32)
CO2: 15 mmol/L — ABNORMAL LOW (ref 22–32)
CO2: 17 mmol/L — ABNORMAL LOW (ref 22–32)
Calcium: 8.2 mg/dL — ABNORMAL LOW (ref 8.9–10.3)
Calcium: 8.9 mg/dL (ref 8.9–10.3)
Calcium: 9.2 mg/dL (ref 8.9–10.3)
Chloride: 105 mmol/L (ref 98–111)
Chloride: 106 mmol/L (ref 98–111)
Chloride: 111 mmol/L (ref 98–111)
Creatinine, Ser: 1.92 mg/dL — ABNORMAL HIGH (ref 0.61–1.24)
Creatinine, Ser: 1.96 mg/dL — ABNORMAL HIGH (ref 0.61–1.24)
Creatinine, Ser: 2.27 mg/dL — ABNORMAL HIGH (ref 0.61–1.24)
GFR calc Af Amer: 32 mL/min — ABNORMAL LOW (ref >60)
GFR calc Af Amer: 38 mL/min — ABNORMAL LOW (ref >60)
GFR calc Af Amer: 39 mL/min — ABNORMAL LOW (ref >60)
GFR calc non Af Amer: 27 mL/min — ABNORMAL LOW (ref >60)
GFR calc non Af Amer: 32 mL/min — ABNORMAL LOW (ref >60)
GFR calc non Af Amer: 33 mL/min — ABNORMAL LOW (ref >60)
Glucose, Bld: 155 mg/dL — ABNORMAL HIGH (ref 70–99)
Glucose, Bld: 184 mg/dL — ABNORMAL HIGH (ref 70–99)
Glucose, Bld: 290 mg/dL — ABNORMAL HIGH (ref 70–99)
Potassium: 4.8 mmol/L (ref 3.5–5.1)
Potassium: 4.9 mmol/L (ref 3.5–5.1)
Potassium: 5.5 mmol/L — ABNORMAL HIGH (ref 3.5–5.1)
Sodium: 136 mmol/L (ref 135–145)
Sodium: 138 mmol/L (ref 135–145)
Sodium: 138 mmol/L (ref 135–145)

## 2019-10-25 LAB — CBC WITH DIFFERENTIAL/PLATELET
Abs Immature Granulocytes: 0.06 10*3/uL (ref 0.00–0.07)
Basophils Absolute: 0 10*3/uL (ref 0.0–0.1)
Basophils Relative: 0 %
Eosinophils Absolute: 0 10*3/uL (ref 0.0–0.5)
Eosinophils Relative: 0 %
HCT: 28.6 % — ABNORMAL LOW (ref 39.0–52.0)
Hemoglobin: 9 g/dL — ABNORMAL LOW (ref 13.0–17.0)
Immature Granulocytes: 1 %
Lymphocytes Relative: 18 %
Lymphs Abs: 2.2 10*3/uL (ref 0.7–4.0)
MCH: 26.5 pg (ref 26.0–34.0)
MCHC: 31.5 g/dL (ref 30.0–36.0)
MCV: 84.1 fL (ref 80.0–100.0)
Monocytes Absolute: 0.9 10*3/uL (ref 0.1–1.0)
Monocytes Relative: 8 %
Neutro Abs: 8.9 10*3/uL — ABNORMAL HIGH (ref 1.7–7.7)
Neutrophils Relative %: 73 %
Platelets: 341 10*3/uL (ref 150–400)
RBC: 3.4 MIL/uL — ABNORMAL LOW (ref 4.22–5.81)
RDW: 15 % (ref 11.5–15.5)
WBC: 12.1 10*3/uL — ABNORMAL HIGH (ref 4.0–10.5)
nRBC: 0 % (ref 0.0–0.2)

## 2019-10-25 LAB — GLUCOSE, CAPILLARY
Glucose-Capillary: 164 mg/dL — ABNORMAL HIGH (ref 70–99)
Glucose-Capillary: 167 mg/dL — ABNORMAL HIGH (ref 70–99)
Glucose-Capillary: 225 mg/dL — ABNORMAL HIGH (ref 70–99)
Glucose-Capillary: 239 mg/dL — ABNORMAL HIGH (ref 70–99)
Glucose-Capillary: 265 mg/dL — ABNORMAL HIGH (ref 70–99)

## 2019-10-25 LAB — HEMOGLOBIN A1C
Hgb A1c MFr Bld: 7.5 % — ABNORMAL HIGH (ref 4.8–5.6)
Mean Plasma Glucose: 168.55 mg/dL

## 2019-10-25 LAB — SURGICAL PCR SCREEN
MRSA, PCR: NEGATIVE
Staphylococcus aureus: NEGATIVE

## 2019-10-25 LAB — TYPE AND SCREEN
ABO/RH(D): O POS
Antibody Screen: NEGATIVE

## 2019-10-25 LAB — ABO/RH: ABO/RH(D): O POS

## 2019-10-25 LAB — MRSA PCR SCREENING: MRSA by PCR: NEGATIVE

## 2019-10-25 IMAGING — DX DG CHEST 1V PORT
1 series · 1 of 1 positions shown · non-contrast
Comparison: Radiograph [DATE]

CLINICAL DATA: Stroke, diabetes

EXAM:
PORTABLE CHEST 1 VIEW

[chest ap]
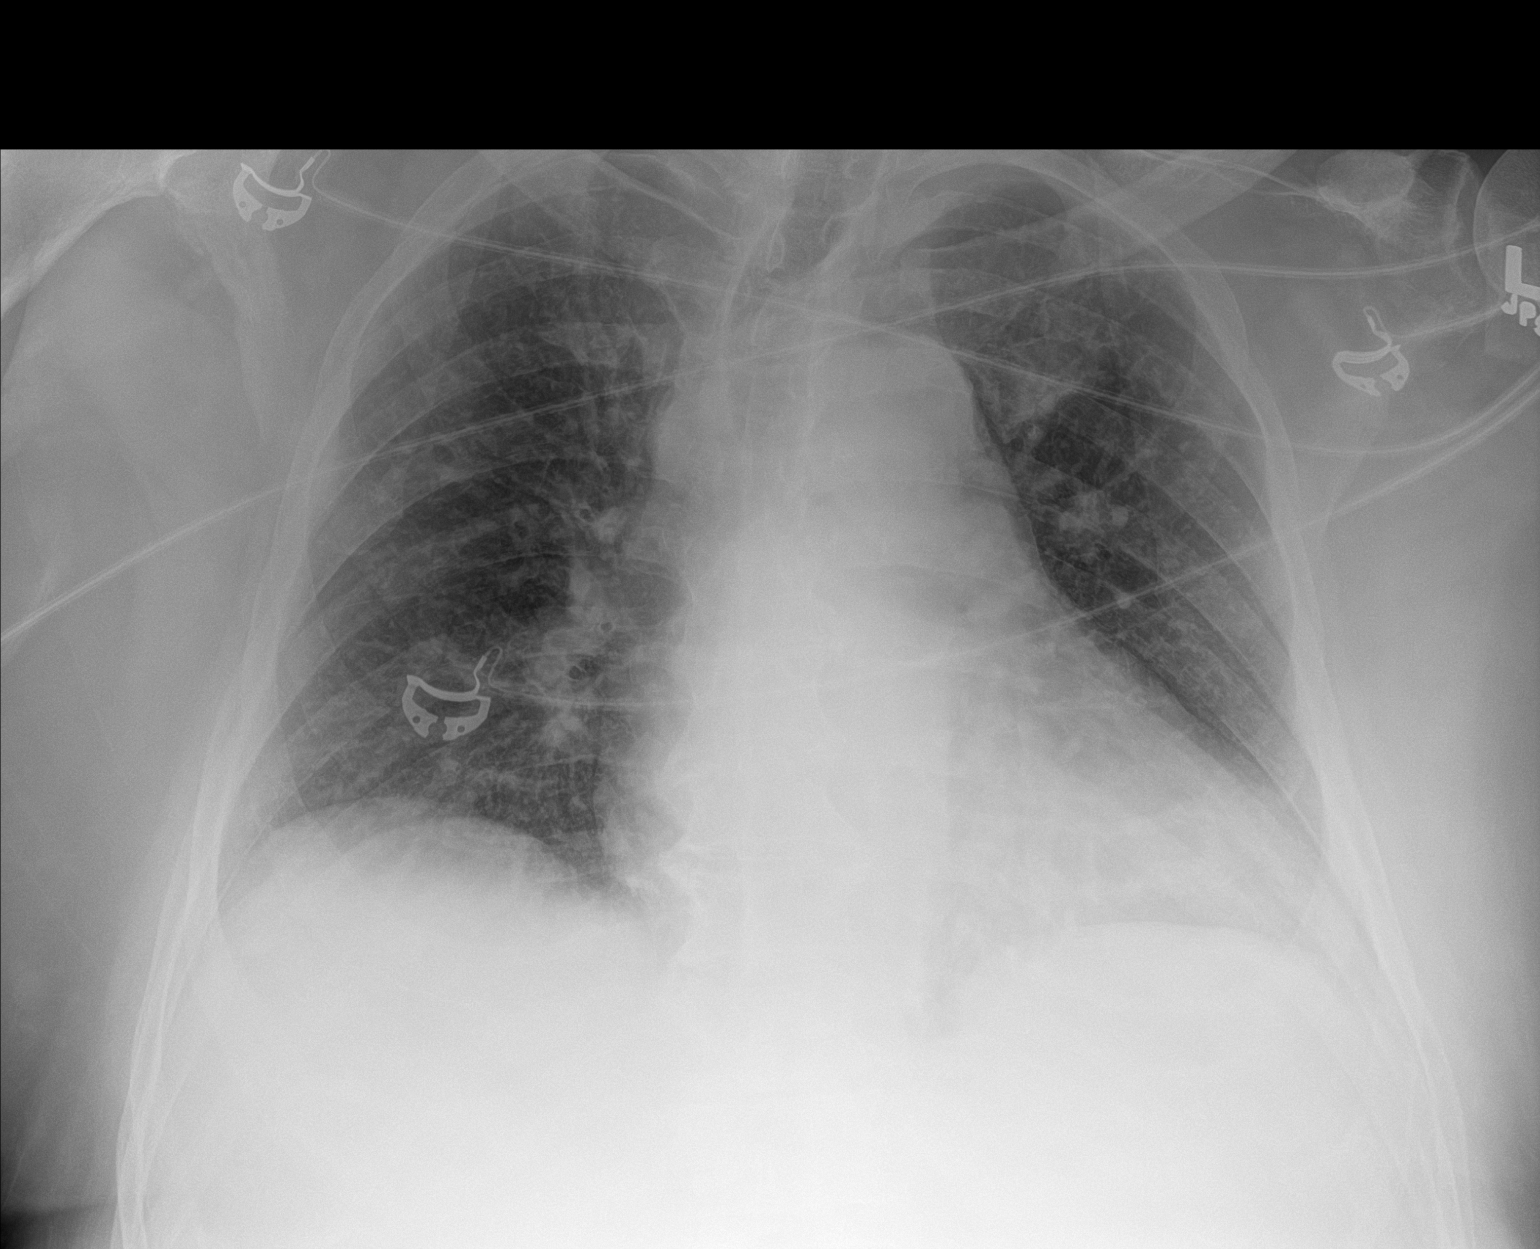

[1 of 1 positions shown; findings below may reference images not displayed]

FINDINGS: Lung volumes are low. Diffuse patchy interstitial and airspace
opacities throughout the lungs with some cephalization and
indistinctness of the pulmonary vascularity. Cardiac silhouette is
enlarged though may be accentuated by the portable technique. No
acute osseous or soft tissue abnormality. Degenerative changes are
present in the imaged spine and shoulders.
IMPRESSION: Diffuse patchy interstitial and airspace opacities throughout the
lungs with cephalization and indistinctness of the pulmonary
vascularity. Findings are compatible with pulmonary edema.
Superimposed pneumonia cannot be excluded.

Likely mild cardiomegaly.

## 2019-10-25 SURGERY — CRANIOTOMY HEMATOMA EVACUATION SUBDURAL
Anesthesia: General | Laterality: Left

## 2019-10-25 MED ORDER — VITAMIN B-12 1000 MCG PO TABS
1000.0000 ug | ORAL_TABLET | Freq: Every day | ORAL | Status: DC
  Filled 2019-10-25 (×4): qty 1

## 2019-10-25 MED ORDER — SODIUM CHLORIDE 0.9 % IV SOLN: Freq: Once | INTRAVENOUS | Status: AC

## 2019-10-25 MED ORDER — LIDOCAINE 2% (20 MG/ML) 5 ML SYRINGE
INTRAMUSCULAR | Status: DC | PRN
  Administered 2019-10-25: 60 mg via INTRAVENOUS

## 2019-10-25 MED ORDER — THROMBIN 20000 UNITS EX SOLR
CUTANEOUS | Status: AC
  Filled 2019-10-25: qty 20000

## 2019-10-25 MED ORDER — PHENYLEPHRINE HCL (PRESSORS) 10 MG/ML IV SOLN
INTRAVENOUS | Status: DC | PRN
  Administered 2019-10-25: 160 ug via INTRAVENOUS
  Administered 2019-10-25: 120 ug via INTRAVENOUS

## 2019-10-25 MED ORDER — PROPOFOL 10 MG/ML IV BOLUS
INTRAVENOUS | Status: AC
  Filled 2019-10-25: qty 20

## 2019-10-25 MED ORDER — FENOFIBRATE 160 MG PO TABS
160.0000 mg | ORAL_TABLET | Freq: Every day | ORAL | Status: DC
  Filled 2019-10-25: qty 1

## 2019-10-25 MED ORDER — THROMBIN 5000 UNITS EX SOLR
CUTANEOUS | Status: AC
  Filled 2019-10-25: qty 5000

## 2019-10-25 MED ORDER — ROCURONIUM BROMIDE 10 MG/ML (PF) SYRINGE
PREFILLED_SYRINGE | INTRAVENOUS | Status: AC
  Filled 2019-10-25: qty 20

## 2019-10-25 MED ORDER — 0.9 % SODIUM CHLORIDE (POUR BTL) OPTIME
TOPICAL | Status: DC | PRN
  Administered 2019-10-25: 17:00:00 1000 mL
  Administered 2019-10-25: 2000 mL

## 2019-10-25 MED ORDER — METFORMIN HCL 500 MG PO TABS: 500.0000 mg | ORAL_TABLET | Freq: Two times a day (BID) | ORAL | Status: DC

## 2019-10-25 MED ORDER — MUPIROCIN 2 % EX OINT: 1.0000 "application " | TOPICAL_OINTMENT | Freq: Two times a day (BID) | CUTANEOUS | Status: DC

## 2019-10-25 MED ORDER — MORPHINE SULFATE (PF) 2 MG/ML IV SOLN
1.0000 mg | INTRAVENOUS | Status: DC | PRN
  Administered 2019-10-25 – 2019-10-26 (×4): 2 mg via INTRAVENOUS
  Administered 2019-10-27: 1 mg via INTRAVENOUS
  Administered 2019-10-28 (×2): 2 mg via INTRAVENOUS
  Filled 2019-10-25 (×8): qty 1

## 2019-10-25 MED ORDER — THROMBIN 20000 UNITS EX SOLR
CUTANEOUS | Status: DC | PRN
  Administered 2019-10-25: 16:00:00 20 mL via TOPICAL

## 2019-10-25 MED ORDER — FENTANYL CITRATE (PF) 100 MCG/2ML IJ SOLN
25.0000 ug | INTRAMUSCULAR | Status: DC | PRN
  Administered 2019-10-25 (×3): 25 ug via INTRAVENOUS
  Administered 2019-10-25: 15:00:00 100 ug via INTRAVENOUS
  Administered 2019-10-25: 18:00:00 50 ug via INTRAVENOUS
  Administered 2019-10-25: 25 ug via INTRAVENOUS
  Administered 2019-10-25: 50 ug via INTRAVENOUS
  Administered 2019-10-28 – 2019-10-29 (×4): 25 ug via INTRAVENOUS
  Filled 2019-10-25 (×6): qty 2

## 2019-10-25 MED ORDER — SODIUM CHLORIDE 0.9 % IV SOLN
INTRAVENOUS | Status: DC
  Administered 2019-10-27: 1000 mL via INTRAVENOUS

## 2019-10-25 MED ORDER — FENTANYL CITRATE (PF) 250 MCG/5ML IJ SOLN
INTRAMUSCULAR | Status: AC
  Filled 2019-10-25: qty 5

## 2019-10-25 MED ORDER — SUGAMMADEX SODIUM 200 MG/2ML IV SOLN
INTRAVENOUS | Status: DC | PRN
  Administered 2019-10-25: 250 mg via INTRAVENOUS

## 2019-10-25 MED ORDER — PANTOPRAZOLE SODIUM 40 MG IV SOLR
40.0000 mg | Freq: Every day | INTRAVENOUS | Status: DC
  Administered 2019-10-25 – 2019-10-26 (×2): 40 mg via INTRAVENOUS
  Filled 2019-10-25 (×2): qty 40

## 2019-10-25 MED ORDER — CHLORHEXIDINE GLUCONATE CLOTH 2 % EX PADS
6.0000 | MEDICATED_PAD | Freq: Every day | CUTANEOUS | Status: DC
  Administered 2019-10-26: 6 via TOPICAL

## 2019-10-25 MED ORDER — ACETAMINOPHEN 325 MG PO TABS
650.0000 mg | ORAL_TABLET | ORAL | Status: DC | PRN
  Administered 2019-11-09: 650 mg via ORAL
  Filled 2019-10-25: qty 2

## 2019-10-25 MED ORDER — ALBUMIN HUMAN 5 % IV SOLN: INTRAVENOUS | Status: DC | PRN

## 2019-10-25 MED ORDER — POTASSIUM CHLORIDE IN NACL 20-0.9 MEQ/L-% IV SOLN
INTRAVENOUS | Status: DC
  Filled 2019-10-25: qty 1000

## 2019-10-25 MED ORDER — ROCURONIUM BROMIDE 10 MG/ML (PF) SYRINGE
PREFILLED_SYRINGE | INTRAVENOUS | Status: DC | PRN
  Administered 2019-10-25: 60 mg via INTRAVENOUS
  Administered 2019-10-25 (×2): 20 mg via INTRAVENOUS

## 2019-10-25 MED ORDER — CEFAZOLIN SODIUM-DEXTROSE 1-4 GM/50ML-% IV SOLN
1.0000 g | Freq: Three times a day (TID) | INTRAVENOUS | Status: AC
  Administered 2019-10-26 (×3): 1 g via INTRAVENOUS
  Filled 2019-10-25 (×3): qty 50

## 2019-10-25 MED ORDER — LISINOPRIL 20 MG PO TABS: 40.0000 mg | ORAL_TABLET | Freq: Every day | ORAL | Status: DC

## 2019-10-25 MED ORDER — PHENYLEPHRINE 40 MCG/ML (10ML) SYRINGE FOR IV PUSH (FOR BLOOD PRESSURE SUPPORT)
PREFILLED_SYRINGE | INTRAVENOUS | Status: AC
  Filled 2019-10-25: qty 30

## 2019-10-25 MED ORDER — BUPIVACAINE HCL (PF) 0.5 % IJ SOLN
INTRAMUSCULAR | Status: AC
  Filled 2019-10-25: qty 30

## 2019-10-25 MED ORDER — EPHEDRINE 5 MG/ML INJ
INTRAVENOUS | Status: AC
  Filled 2019-10-25: qty 20

## 2019-10-25 MED ORDER — PHENYLEPHRINE HCL-NACL 10-0.9 MG/250ML-% IV SOLN
INTRAVENOUS | Status: DC | PRN
  Administered 2019-10-25: 120 ug/min via INTRAVENOUS
  Administered 2019-10-25: 25 ug/min via INTRAVENOUS

## 2019-10-25 MED ORDER — EPHEDRINE SULFATE 50 MG/ML IJ SOLN
INTRAMUSCULAR | Status: DC | PRN
  Administered 2019-10-25: 10 mg via INTRAVENOUS
  Administered 2019-10-25 (×3): 5 mg via INTRAVENOUS
  Administered 2019-10-25: 10 mg via INTRAVENOUS
  Administered 2019-10-25 (×2): 5 mg via INTRAVENOUS

## 2019-10-25 MED ORDER — SUCCINYLCHOLINE CHLORIDE 200 MG/10ML IV SOSY
PREFILLED_SYRINGE | INTRAVENOUS | Status: AC
  Filled 2019-10-25: qty 20

## 2019-10-25 MED ORDER — HYDROCHLOROTHIAZIDE 25 MG PO TABS: 25.0000 mg | ORAL_TABLET | Freq: Every day | ORAL | Status: DC

## 2019-10-25 MED ORDER — SODIUM CHLORIDE 0.9 % IV SOLN: INTRAVENOUS | Status: DC | PRN

## 2019-10-25 MED ORDER — DOCUSATE SODIUM 100 MG PO CAPS
100.0000 mg | ORAL_CAPSULE | Freq: Two times a day (BID) | ORAL | Status: DC
  Filled 2019-10-25: qty 1

## 2019-10-25 MED ORDER — HYDROCODONE-ACETAMINOPHEN 5-325 MG PO TABS: 1.0000 | ORAL_TABLET | ORAL | Status: DC | PRN

## 2019-10-25 MED ORDER — GABAPENTIN 600 MG PO TABS
600.0000 mg | ORAL_TABLET | Freq: Three times a day (TID) | ORAL | Status: DC
  Filled 2019-10-25 (×3): qty 1

## 2019-10-25 MED ORDER — SODIUM CHLORIDE 0.9 % IV SOLN
INTRAVENOUS | Status: DC | PRN
  Administered 2019-10-25: 500 mL

## 2019-10-25 MED ORDER — ONDANSETRON HCL 4 MG/2ML IJ SOLN: 4.0000 mg | Freq: Once | INTRAMUSCULAR | Status: DC | PRN

## 2019-10-25 MED ORDER — PROPOFOL 10 MG/ML IV BOLUS
INTRAVENOUS | Status: DC | PRN
  Administered 2019-10-25: 150 mg via INTRAVENOUS

## 2019-10-25 MED ORDER — ONDANSETRON HCL 4 MG/2ML IJ SOLN
INTRAMUSCULAR | Status: DC | PRN
  Administered 2019-10-25: 4 mg via INTRAVENOUS

## 2019-10-25 MED ORDER — CARVEDILOL 12.5 MG PO TABS
12.5000 mg | ORAL_TABLET | Freq: Two times a day (BID) | ORAL | Status: DC
  Administered 2019-10-25: 13:00:00 12.5 mg via ORAL
  Filled 2019-10-25: qty 1

## 2019-10-25 MED ORDER — PNEUMOCOCCAL VAC POLYVALENT 25 MCG/0.5ML IJ INJ
0.5000 mL | INJECTION | INTRAMUSCULAR | Status: DC
  Filled 2019-10-25: qty 0.5

## 2019-10-25 MED ORDER — ONDANSETRON HCL 4 MG/2ML IJ SOLN: 4.0000 mg | INTRAMUSCULAR | Status: DC | PRN

## 2019-10-25 MED ORDER — LABETALOL HCL 5 MG/ML IV SOLN: 20.0000 mg | Freq: Once | INTRAVENOUS | Status: DC

## 2019-10-25 MED ORDER — LIDOCAINE-EPINEPHRINE 1 %-1:100000 IJ SOLN
INTRAMUSCULAR | Status: AC
  Filled 2019-10-25: qty 1

## 2019-10-25 MED ORDER — ACETAMINOPHEN 160 MG/5ML PO SOLN
650.0000 mg | ORAL | Status: DC | PRN
  Administered 2019-10-29 – 2019-10-30 (×4): 650 mg
  Filled 2019-10-25 (×5): qty 20.3

## 2019-10-25 MED ORDER — DEXAMETHASONE SODIUM PHOSPHATE 10 MG/ML IJ SOLN
INTRAMUSCULAR | Status: DC | PRN
  Administered 2019-10-25: 10 mg via INTRAVENOUS

## 2019-10-25 MED ORDER — PROMETHAZINE HCL 25 MG PO TABS: 12.5000 mg | ORAL_TABLET | ORAL | Status: DC | PRN

## 2019-10-25 MED ORDER — BISACODYL 10 MG RE SUPP
10.0000 mg | Freq: Every day | RECTAL | Status: DC | PRN
  Administered 2019-10-31: 10 mg via RECTAL
  Filled 2019-10-25: qty 1

## 2019-10-25 MED ORDER — POLYETHYLENE GLYCOL 3350 17 G PO PACK: 17.0000 g | PACK | Freq: Every day | ORAL | Status: DC | PRN

## 2019-10-25 MED ORDER — PRAVASTATIN SODIUM 10 MG PO TABS: 10.0000 mg | ORAL_TABLET | Freq: Every day | ORAL | Status: DC

## 2019-10-25 MED ORDER — ACETAMINOPHEN 10 MG/ML IV SOLN: 1000.0000 mg | Freq: Once | INTRAVENOUS | Status: DC | PRN

## 2019-10-25 MED ORDER — ONDANSETRON HCL 4 MG PO TABS: 4.0000 mg | ORAL_TABLET | ORAL | Status: DC | PRN

## 2019-10-25 MED ORDER — CEFAZOLIN SODIUM-DEXTROSE 2-4 GM/100ML-% IV SOLN
2.0000 g | INTRAVENOUS | Status: DC
  Filled 2019-10-25: qty 100

## 2019-10-25 MED ORDER — LABETALOL HCL 5 MG/ML IV SOLN
10.0000 mg | INTRAVENOUS | Status: DC | PRN
  Administered 2019-10-27 – 2019-10-29 (×7): 20 mg via INTRAVENOUS
  Administered 2019-11-02: 10 mg via INTRAVENOUS
  Filled 2019-10-25: qty 8
  Filled 2019-10-25 (×10): qty 4

## 2019-10-25 MED ORDER — INSULIN GLARGINE 100 UNIT/ML ~~LOC~~ SOLN
60.0000 [IU] | Freq: Every day | SUBCUTANEOUS | Status: DC
  Filled 2019-10-25: qty 0.6

## 2019-10-25 MED ORDER — FLEET ENEMA 7-19 GM/118ML RE ENEM: 1.0000 | ENEMA | Freq: Once | RECTAL | Status: DC | PRN

## 2019-10-25 MED ORDER — LIDOCAINE-EPINEPHRINE 1 %-1:100000 IJ SOLN
INTRAMUSCULAR | Status: DC | PRN
  Administered 2019-10-25: 20 mL

## 2019-10-25 MED ORDER — THROMBIN 5000 UNITS EX SOLR
OROMUCOSAL | Status: DC | PRN
  Administered 2019-10-25 (×2): 5 mL via TOPICAL

## 2019-10-25 MED ORDER — NICARDIPINE HCL IN NACL 20-0.86 MG/200ML-% IV SOLN: 0.0000 mg/h | INTRAVENOUS | Status: DC

## 2019-10-25 MED ORDER — INSULIN ASPART 100 UNIT/ML ~~LOC~~ SOLN
0.0000 [IU] | SUBCUTANEOUS | Status: DC
  Administered 2019-10-25: 3 [IU] via SUBCUTANEOUS
  Administered 2019-10-25: 22:00:00 5 [IU] via SUBCUTANEOUS
  Administered 2019-10-25 – 2019-10-26 (×4): 3 [IU] via SUBCUTANEOUS
  Administered 2019-10-26: 8 [IU] via SUBCUTANEOUS
  Administered 2019-10-26: 04:00:00 5 [IU] via SUBCUTANEOUS
  Administered 2019-10-26 – 2019-10-27 (×3): 3 [IU] via SUBCUTANEOUS
  Administered 2019-10-27 (×2): 5 [IU] via SUBCUTANEOUS
  Administered 2019-10-27 (×3): 3 [IU] via SUBCUTANEOUS
  Administered 2019-10-28 (×3): 5 [IU] via SUBCUTANEOUS
  Administered 2019-10-28: 16:00:00 3 [IU] via SUBCUTANEOUS
  Administered 2019-10-28: 2 [IU] via SUBCUTANEOUS
  Administered 2019-10-29: 14:00:00 5 [IU] via SUBCUTANEOUS
  Administered 2019-10-29: 8 [IU] via SUBCUTANEOUS
  Administered 2019-10-29 (×2): 3 [IU] via SUBCUTANEOUS
  Administered 2019-10-29: 20:00:00 5 [IU] via SUBCUTANEOUS
  Administered 2019-10-29: 3 [IU] via SUBCUTANEOUS
  Administered 2019-10-30 (×4): 5 [IU] via SUBCUTANEOUS
  Administered 2019-10-30: 20:00:00 3 [IU] via SUBCUTANEOUS
  Administered 2019-10-30 – 2019-10-31 (×3): 5 [IU] via SUBCUTANEOUS
  Administered 2019-10-31 (×2): 3 [IU] via SUBCUTANEOUS
  Administered 2019-10-31 – 2019-11-01 (×5): 5 [IU] via SUBCUTANEOUS

## 2019-10-25 MED ORDER — SENNOSIDES-DOCUSATE SODIUM 8.6-50 MG PO TABS
1.0000 | ORAL_TABLET | Freq: Two times a day (BID) | ORAL | Status: DC
  Administered 2019-10-29: 10:00:00 1 via ORAL
  Filled 2019-10-25 (×2): qty 1

## 2019-10-25 MED ORDER — FENTANYL CITRATE (PF) 100 MCG/2ML IJ SOLN: 25.0000 ug | INTRAMUSCULAR | Status: DC | PRN

## 2019-10-25 MED ORDER — CEFAZOLIN SODIUM-DEXTROSE 2-3 GM-%(50ML) IV SOLR
INTRAVENOUS | Status: DC | PRN
  Administered 2019-10-25: 2 g via INTRAVENOUS

## 2019-10-25 MED ORDER — ACETAMINOPHEN 650 MG RE SUPP
650.0000 mg | RECTAL | Status: DC | PRN
  Administered 2019-10-27 (×2): 650 mg via RECTAL
  Filled 2019-10-25 (×3): qty 1

## 2019-10-25 SURGICAL SUPPLY — 76 items
APL SKNCLS STERI-STRIP NONHPOA (GAUZE/BANDAGES/DRESSINGS)
BATTERY IQ STERILE (MISCELLANEOUS) ×1 IMPLANT
BENZOIN TINCTURE PRP APPL 2/3 (GAUZE/BANDAGES/DRESSINGS) IMPLANT
BLADE CLIPPER SURG (BLADE) ×2 IMPLANT
BNDG GAUZE ELAST 4 BULKY (GAUZE/BANDAGES/DRESSINGS) IMPLANT
BTRY SRG DRVR 1.5 IQ (MISCELLANEOUS) ×1
BUR ACORN 6.0 PRECISION (BURR) ×2 IMPLANT
BUR ACORN 9.0 PRECISION (BURR) ×1 IMPLANT
BUR MATCHSTICK NEURO 3.0 LAGG (BURR) IMPLANT
BUR SPIRAL ROUTER 2.3 (BUR) IMPLANT
CANISTER SUCT 3000ML PPV (MISCELLANEOUS) ×4 IMPLANT
CARTRIDGE OIL MAESTRO DRILL (MISCELLANEOUS) ×1 IMPLANT
CLIP VESOCCLUDE MED 6/CT (CLIP) IMPLANT
CONT SPEC 4OZ CLIKSEAL STRL BL (MISCELLANEOUS) ×1 IMPLANT
COVER WAND RF STERILE (DRAPES) ×1 IMPLANT
DIFFUSER DRILL AIR PNEUMATIC (MISCELLANEOUS) ×2 IMPLANT
DRAPE NEUROLOGICAL W/INCISE (DRAPES) ×2 IMPLANT
DRAPE SURG 17X23 STRL (DRAPES) IMPLANT
DRAPE WARM FLUID 44X44 (DRAPES) ×2 IMPLANT
DRSG MEPILEX BORDER 4X12 (GAUZE/BANDAGES/DRESSINGS) ×1 IMPLANT
DRSG MEPILEX BORDER 4X8 (GAUZE/BANDAGES/DRESSINGS) ×1 IMPLANT
DURAPREP 6ML APPLICATOR 50/CS (WOUND CARE) ×1 IMPLANT
ELECT REM PT RETURN 9FT ADLT (ELECTROSURGICAL) ×2
ELECTRODE REM PT RTRN 9FT ADLT (ELECTROSURGICAL) ×1 IMPLANT
EVACUATOR 1/8 PVC DRAIN (DRAIN) ×1 IMPLANT
EVACUATOR SILICONE 100CC (DRAIN) IMPLANT
GAUZE 4X4 16PLY RFD (DISPOSABLE) IMPLANT
GAUZE SPONGE 4X4 12PLY STRL (GAUZE/BANDAGES/DRESSINGS) ×2 IMPLANT
GAUZE XEROFORM 1X8 LF (GAUZE/BANDAGES/DRESSINGS) ×1 IMPLANT
GLOVE BIOGEL PI IND STRL 7.5 (GLOVE) ×2 IMPLANT
GLOVE BIOGEL PI INDICATOR 7.5 (GLOVE) ×2
GLOVE ECLIPSE 7.5 STRL STRAW (GLOVE) ×2 IMPLANT
GLOVE EXAM NITRILE XL STR (GLOVE) ×2 IMPLANT
GOWN STRL REUS W/ TWL LRG LVL3 (GOWN DISPOSABLE) ×2 IMPLANT
GOWN STRL REUS W/ TWL XL LVL3 (GOWN DISPOSABLE) IMPLANT
GOWN STRL REUS W/TWL 2XL LVL3 (GOWN DISPOSABLE) IMPLANT
GOWN STRL REUS W/TWL LRG LVL3 (GOWN DISPOSABLE) ×4
GOWN STRL REUS W/TWL XL LVL3 (GOWN DISPOSABLE)
GRAFT DURAGEN MATRIX 5WX7L (Graft) ×1 IMPLANT
HEMOSTAT POWDER KIT SURGIFOAM (HEMOSTASIS) ×3 IMPLANT
HEMOSTAT SURGICEL 2X14 (HEMOSTASIS) IMPLANT
KIT BASIN OR (CUSTOM PROCEDURE TRAY) ×2 IMPLANT
KIT TURNOVER KIT B (KITS) ×2 IMPLANT
NEEDLE HYPO 22GX1.5 SAFETY (NEEDLE) ×2 IMPLANT
NS IRRIG 1000ML POUR BTL (IV SOLUTION) ×4 IMPLANT
OIL CARTRIDGE MAESTRO DRILL (MISCELLANEOUS) ×2
PACK CRANIOTOMY CUSTOM (CUSTOM PROCEDURE TRAY) ×2 IMPLANT
PATTIES SURGICAL .5 X.5 (GAUZE/BANDAGES/DRESSINGS) IMPLANT
PATTIES SURGICAL .5 X3 (DISPOSABLE) IMPLANT
PATTIES SURGICAL 1X1 (DISPOSABLE) IMPLANT
PLATE 1.5/0.5 18.5MM BURR HOLE (Plate) ×4 IMPLANT
SCREW SELF DRILL HT 1.5/4MM (Screw) ×16 IMPLANT
SPONGE LAP 4X18 RFD (DISPOSABLE) ×1 IMPLANT
SPONGE NEURO XRAY DETECT 1X3 (DISPOSABLE) IMPLANT
SPONGE SURGIFOAM ABS GEL 100 (HEMOSTASIS) ×2 IMPLANT
STAPLER VISISTAT 35W (STAPLE) ×2 IMPLANT
STOCKINETTE 6  STRL (DRAPES) ×1
STOCKINETTE 6 STRL (DRAPES) ×1 IMPLANT
SUT ETHILON 3 0 FSL (SUTURE) IMPLANT
SUT ETHILON 3 0 PS 1 (SUTURE) IMPLANT
SUT MNCRL AB 3-0 PS2 18 (SUTURE) ×1 IMPLANT
SUT NURALON 4 0 TR CR/8 (SUTURE) ×4 IMPLANT
SUT STEEL 0 (SUTURE)
SUT STEEL 0 18XMFL TIE 17 (SUTURE) IMPLANT
SUT VIC AB 0 CT1 18XCR BRD8 (SUTURE) ×2 IMPLANT
SUT VIC AB 0 CT1 8-18 (SUTURE)
SUT VIC AB 2-0 CP2 18 (SUTURE) ×4 IMPLANT
SUT VIC AB 3-0 SH 8-18 (SUTURE) ×2 IMPLANT
TAPE CLOTH 1X10 TAN NS (GAUZE/BANDAGES/DRESSINGS) ×2 IMPLANT
TOWEL GREEN STERILE (TOWEL DISPOSABLE) ×2 IMPLANT
TOWEL GREEN STERILE FF (TOWEL DISPOSABLE) ×2 IMPLANT
TRAY FOLEY MTR SLVR 16FR STAT (SET/KITS/TRAYS/PACK) ×2 IMPLANT
TUBE CONNECTING 12X1/4 (SUCTIONS) ×1 IMPLANT
TUBE CONNECTING 20X1/4 (TUBING) ×3 IMPLANT
UNDERPAD 30X30 (UNDERPADS AND DIAPERS) ×2 IMPLANT
WATER STERILE IRR 1000ML POUR (IV SOLUTION) ×2 IMPLANT

## 2019-10-25 NOTE — Progress Notes (Signed)
OT Cancellation Note  Patient Details Name: Tommy Green MRN: [id number] DOB: 1946-03-17   Cancelled Treatment:    Reason Eval/Treat Not Completed: Patient not medically ready;Active bedrest order(Per RN, pt scheduled for craniotomy today, 10/25/19.)  OT to continue to follow for OT eval once medically ready.  Jefferey Pica OTR/L Acute Rehabilitation Services Pager: [phone] Office: [phone] C 10/25/2019, 10:15 AM

## 2019-10-25 NOTE — Anesthesia Procedure Notes (Signed)
Procedure Name: Intubation Date/Time: 10/25/2019 3:05 PM Performed by: Shirlyn Goltz, CRNA Pre-anesthesia Checklist: Patient identified, Emergency Drugs available, Patient being monitored and Suction available Patient Re-evaluated:Patient Re-evaluated prior to induction Oxygen Delivery Method: Circle system utilized Preoxygenation: Pre-oxygenation with 100% oxygen Induction Type: IV induction Ventilation: Mask ventilation without difficulty Laryngoscope Size: Mac and 4 Grade View: Grade II Tube type: Oral Tube size: 7.5 mm Number of attempts: 1 Airway Equipment and Method: Stylet Placement Confirmation: ETT inserted through vocal cords under direct vision,  positive ETCO2 and breath sounds checked- equal and bilateral Secured at: 23 cm Tube secured with: Tape Dental Injury: Teeth and Oropharynx as per pre-operative assessment

## 2019-10-25 NOTE — Anesthesia Postprocedure Evaluation (Signed)
Anesthesia Post Note  Patient: Tommy Green  Procedure(s) Performed: CRANIOTOMY HEMATOMA EVACUATION SUBDURAL (Left )     Patient location during evaluation: PACU Anesthesia Type: General Level of consciousness: sedated Pain management: pain level controlled Vital Signs Assessment: post-procedure vital signs reviewed and stable Respiratory status: spontaneous breathing and respiratory function stable Cardiovascular status: stable Postop Assessment: no apparent nausea or vomiting Anesthetic complications: no    Last Vitals:  Vitals:   10/25/19 1900 10/25/19 1915  BP: (!) 126/55 124/61  Pulse: 81 78  Resp: 19 18  Temp:    SpO2: 93% 93%    Last Pain:  Vitals:   10/25/19 1900  TempSrc:   PainSc: Asleep                 Salina Stanfield DANIEL

## 2019-10-25 NOTE — H&P (Signed)
Subjective: He is a 74 y.o. male with type 2 diabetes on insulin who suffered blunt head trauma several weeks ago at which point imaging was unconcerning.  He subsequently developed increased confusion and speech difficulties as well as decreased mobility over the ensuing several weeks to the point where he was no longer able to perform any ADLs.  He was taken to [facility] where a CT head showed a loculated acute-on-chronic SDH on the left with mass effect.  He was transferred to [facility] hospital for possible surgery.  He is not on blood thinners, no blood dyscrasias.  Patient Active Problem List   Diagnosis Date Noted  . Subdural hematoma ([place]) 10/24/2019  . Chronic pain of right knee 10/03/2019  . Cyclic vomiting syndrome 08/06/2019  . Neuropathy 12/25/2018  . Bilateral hip pain 08/24/2018  . AK (actinic keratosis) 08/24/2018  . Neoplasm of uncertain behavior 0000000  . 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 Tendinopathy of right gluteus medius 07/13/2018  . Tendinopathy of left gluteus medius 07/13/2018  . Squamous cell carcinoma in situ (SCCIS) of skin 03/24/2018  . Skin lesion 03/17/2018  . Essential hypertension 07/18/2017  . Hyperlipidemia associated with type 2 diabetes mellitus ([place]) 07/18/2017  . Type 2 diabetes mellitus with diabetic neuropathy, unspecified ([place]) 07/18/2017  . Heart murmur 07/18/2017  . Hypertriglyceridemia 07/18/2017  . History of stroke 07/18/2017  . Pain in joint, ankle and foot 02/06/2013   Past Medical History:  Diagnosis Date  . Anemia   . Essential hypertension 07/18/2017  . History of rheumatic fever   . History of stroke 07/18/2017  . Hypertriglyceridemia 07/18/2017  . Type 2 diabetes mellitus with diabetic neuropathy, unspecified ([place]) 07/18/2017    Past Surgical History:  Procedure Laterality Date  . FOOT CAPSULOTOMY Left 07/25/2008   Mid Foot #2 MPJ  . Hammertoe Repair Left 07/25/2008   #2 toe  . SPINAL FUSION    . TARSAL TUNNEL RELEASE Left 07/25/2008     Medications Prior to Admission  Medication Sig Dispense Refill Last Dose  . carvedilol (COREG) 12.5 MG tablet TAKE 1 TABLET TWICE DAILY  WITH MEALS 180 tablet 1   . clobetasol cream (TEMOVATE) [id number] % Apply 1 application topically 2 (two) times daily. 30 g 0   . fenofibrate micronized (LOFIBRA) 200 MG capsule TAKE 1 CAPSULE DAILY BEFOREBREAKFAST 90 capsule 3   . gabapentin (NEURONTIN) 600 MG tablet TAKE 1 TABLET 3 TIMES A DAY 270 tablet 1   . hydrochlorothiazide (HYDRODIURIL) 25 MG tablet TAKE 1 TABLET DAILY 90 tablet 3   . Insulin Aspart (NOVOLOG [place]) Sliding scale     . insulin degludec (TRESIBA) 100 UNIT/ML SOPN FlexTouch Pen Inject 0.6 mLs (60 Units total) into the skin at bedtime. 5 pen 2   . lisinopril (ZESTRIL) 40 MG tablet TAKE 1 TABLET DAILY 90 tablet 3   . lovastatin (MEVACOR) 10 MG tablet Take 1 tablet (10 mg total) by mouth at bedtime. 30 tablet 3   . metFORMIN (GLUCOPHAGE) 500 MG tablet Take 500 mg by mouth 2 (two) times daily with a meal.     . Multiple Vitamins-Minerals (MULTIVITAMIN WITH MINERALS) tablet Take 1 tablet by mouth daily.     . vitamin B-12 (CYANOCOBALAMIN) 1000 MCG tablet Take 1,000 mcg by mouth daily.      Allergies  Allergen Reactions  . Influenza Vaccines Other (See Comments)  . Aspirin     Other reaction(s): Other (See Comments) Gastroesophageal reflux Unspecified    . Atorvastatin Other (See Comments)  Myalgias GI upset Unspecified    . Canagliflozin     Other reaction(s): Other (See Comments) Pt reports that the use of Invokana caused acute kidney failure  Unspecified    . Statins     Other reaction(s): Myalgias (intolerance)    Social History   Tobacco Use  . Smoking status: Former Smoker    Types: Cigarettes  . Smokeless tobacco: Never Used  . Tobacco comment: Quit in 1973  Substance Use Topics  . Alcohol use: No    Family History  Problem Relation Age of Onset  . Hyperlipidemia Mother   . Hyperlipidemia Father   . Cancer Neg Hx       Review of Systems Review of systems not obtained due to patient factors.  Objective: Vital signs in last 24 hours: Patient Vitals for the past 8 hrs:  BP Temp Temp src Pulse Resp SpO2  10/25/19 1004 (!) 115/53 -- -- 88 (!) 21 97 %  10/25/19 1000 -- -- -- 81 (!) 23 96 %  10/25/19 0900 (!) 122/54 -- -- 86 20 97 %  10/25/19 0800 124/68 -- -- 89 (!) 22 98 %  10/25/19 0754 -- 98.4 F (36.9 C) Axillary -- -- --  10/25/19 0710 (!) 111/46 -- -- 84 18 98 %  10/25/19 0700 (!) 147/72 -- -- 80 (!) 21 99 %  10/25/19 0600 111/65 -- -- 84 20 96 %  10/25/19 0500 115/88 -- -- 84 16 97 %  10/25/19 0400 122/61 98.6 F (37 C) Oral 87 16 98 %    Glasgow Coma Score Eye opening: 4 - Opens eyes on own  Verbal:  4 - Seems confused, disoriented  Motor:  6 - Follows simple motor commands  GCS Total: 14   BP (!) 115/53   Pulse 88   Temp 98.4 F (36.9 C) (Axillary)   Resp (!) 21   Ht 5\' 4"  (1.626 m)   SpO2 97%   BMI 44.80 kg/m   General Appearance:    Alert, cooperative, no distress, appears stated age  Head:    Normocephalic, without obvious abnormality, atraumatic  Eyes:    PERRL, conjunctiva/corneas clear, EOM's intact, L ptosis  Ears:    Normal external ear canals, both ears  Nose:   Nares normal, septum midline, mucosa normal, no drainage or sinus tenderness  Throat:   Lips, mucosa, and tongue normal; teeth and gums normal  Neck:   Supple, symmetrical, trachea midline  Back:     Symmetric, no curvature,   Lungs:      respirations unlabored  Chest wall:    No tenderness or deformity  Heart:    Regular rate and rhythm  Abdomen:     Soft, non-tender        Extremities:   Extremities normal, atraumatic, no cyanosis or edema  Pulses:   2+ and symmetric all extremities  Skin:   Skin color, texture, turgor normal, no rashes or lesions     Neurologic:  Eyes open spontaneously, FC x 4, awake, alert, and oriented x 1 (to person).  Mild receptive dysphasia and anomia.  PERRL, EOMI, VFC,  V1-3 sensory intact, no facial droop, ubula elevation symmetric, tongue protrusion midline.  Shoulder shrug strong.  No pronator drift, full strength in LEs.  Data Review CBC:  Lab Results  Component Value Date   WBC 12.1 (H) 10/25/2019   RBC 3.40 (L) 10/25/2019   BMP:  Lab Results  Component Value Date   GLUCOSE 184 (H) 10/25/2019   CO2 15 (L) 10/25/2019   BUN 66 (H) 10/25/2019   CREATININE 1.92 (H) 10/25/2019   CALCIUM 8.9 10/25/2019   Coagulation:  Lab Results  Component Value Date   INR 1.1 10/24/2019   APTT 36 10/24/2019   Radiology review: CT head without contrast reviewed-- see HPI.  Imaging: CT Head: reviewed  Assessment/Plan: Concussion.   Indications for admission:   Acute on chronic left SDH.  Plan for craniotomy for evacuation today pending OR availability.  I discussed in detail the procedure with the patient's wife [name].  Risks, benefits, alternatives, and expected convalescence was discussed.  Risks discussed included but were not limited to bleeding, pain, infection, seizure, stroke, scar, recurrence, need for craniectomy, need for drain placement, neurologic deficit, coma, and death.  I also discussed with her the possibility of blood transfusion and blood consent was obtained.  All questions and concerns were answered and she verbalized understanding and agreement with the plan.

## 2019-10-25 NOTE — Consult Note (Addendum)
NAME:  Tommy Green, MRN:  [id number], DOB:  05-19-1946, LOS: 1 ADMISSION DATE:  10/24/2019, CONSULTATION DATE:  10/25/19 REFERRING MD:  Marcello Moores  CHIEF COMPLAINT:  Vent management   Brief History   Tommy Green is a 74 y.o. male who was taken to OR 1/7 for craniotomy and evacuation of SDH.  PCCM consulted for vent management post op; however, pt was extubated in PACU prior to transfer to ICU.  History of present illness   Pt is encephelopathic; therefore, this HPI is obtained from chart review. Tommy Green is a 74 y.o. male who has a PMH as outlined below.  He suffered blunt head trauma a few weeks back and had no concerning finding son imaging.  Subsequently developed confusion, aphasia, mobility problems to the point he could no longer perform any ADLs.  He presented to [facility] ED 1/6 and CT head showed loculated AoC left SDH with mass effect.  He was transferred to [facility] for neurosurgery evaluation.  On 1/7, he was taken to OR for craniotomy and evacuation of SDH.  PCCM consulted for vent management post op; however, pt was extubated in PACU prior to transfer to ICU.  Past Medical History  has Pain in joint, ankle and foot; Essential hypertension; Hyperlipidemia associated with type 2 diabetes mellitus ([place]); Type 2 diabetes mellitus with diabetic neuropathy, unspecified ([place]); Heart murmur; Hypertriglyceridemia; History of stroke; Skin lesion; Squamous cell carcinoma in situ (SCCIS) of skin; Granuloma annulare; Tendinopathy of right gluteus medius; Tendinopathy of left gluteus medius; Neoplasm of uncertain behavior; Bilateral hip pain; AK (actinic keratosis); Neuropathy; Cyclic vomiting syndrome; Chronic pain of right knee; and Subdural hematoma (HCC) on their problem list.  Significant Hospital Events   1/7 > admit.  Consults:  PCCM.  Procedures:  ETT 1/7 >   Significant Diagnostic Tests:  CT head 1/6 > large AoC L SDH with mass effect and 5.55mm L to R MLS. CT head 1/7 >  MRI  head 1/7 >   Micro Data:  SARS CoV 2 1/6 > neg.  Antimicrobials:  None.   Interim history/subjective:  Awake but does not answer questions or follow commands. Vitals stable.  Objective:  Blood pressure 127/61, pulse 79, temperature 97.6 F (36.4 C), resp. rate 20, height 5\' 4"  (1.626 m), weight 118.4 kg, SpO2 93 %.        Intake/Output Summary (Last 24 hours) at 10/25/2019 1935 Last data filed at 10/25/2019 1832 Gross per 24 hour  Intake 2434.7 ml  Output 1125 ml  Net 1309.7 ml   Filed Weights   10/25/19 1256  Weight: 118.4 kg    Examination: General: Adult male, in NAD. Neuro: Awake but does not answer questions or follow commands. HEENT: [place]/AT. Sclerae anicteric.  L cranial drain in place. Cardiovascular: RRR, no M/R/G.  Lungs: Respirations even and unlabored.  CTA bilaterally, No W/R/R.  Abdomen: BS x 4, soft, NT/ND.  Musculoskeletal: No gross deformities, no edema.  Skin: Intact, warm, no rashes.  Assessment & Plan:   L AoC SDH - s/p evacuation 1/7. - Post op care per neurosurgery.  AKI. Mild AG + NAGMA. - Continue gentle hydration. - Repeat BMP now and in AM.  Anemia. - Transfuse for Hgb < 7.  Hx DM. - SSI. - D/c lantus while NPO. - Hold home metformin, tresiba.  Hx HTN, HLD. - Nicardipine PRN for S per neurosurgery. - Hold home carvedilol, HCTZ, lisinopril, fenofibrate, lovastatin - can resume in AM if able to take  PO safely.  No CCM needs at this time.  If remains stable through the night, PCCM will sign off.  Please call back if needed.  Best Practice:  Diet: NPO. Pain/Anxiety/Delirium protocol (if indicated): N/A. VAP protocol (if indicated): N/A. DVT prophylaxis: SCD's. GI prophylaxis: N/A. Glucose control: SSI. Mobility: Bedrest. Code Status: Full. Family Communication: Per primary. Disposition: ICU.  Labs   CBC: Recent Labs  Lab 10/24/19 1535 10/25/19 0534  WBC 17.2* 12.1*  NEUTROABS 13.4* 8.9*  HGB 9.7* 9.0*  HCT 31.3*  28.6*  MCV 85.5 84.1  PLT 371 [id number]   Basic Metabolic Panel: Recent Labs  Lab 10/24/19 1535 10/24/19 2351 10/25/19 0534  NA 136 136 138  K 5.4* 4.9 4.8  CL 104 105 106  CO2 19* 17* 15*  GLUCOSE 136* 155* 184*  BUN 66* 64* 66*  CREATININE 2.03* 1.96* 1.92*  CALCIUM 9.3 9.2 8.9   GFR: Estimated Creatinine Clearance: 39 mL/min (A) (by C-G formula based on SCr of 1.92 mg/dL (H)). Recent Labs  Lab 10/24/19 1535 10/25/19 0534  WBC 17.2* 12.1*   Liver Function Tests: Recent Labs  Lab 10/24/19 1535  AST 15  ALT 14  ALKPHOS 38  BILITOT 0.4  PROT 7.6  ALBUMIN 3.7   No results for input(s): LIPASE, AMYLASE in the last 168 hours. No results for input(s): AMMONIA in the last 168 hours. ABG No results found for: PHART, PCO2ART, PO2ART, HCO3, TCO2, ACIDBASEDEF, O2SAT  Coagulation Profile: Recent Labs  Lab 10/24/19 1542  INR 1.1   Cardiac Enzymes: No results for input(s): CKTOTAL, CKMB, CKMBINDEX, TROPONINI in the last 168 hours. HbA1C: Hgb A1c MFr Bld  Date/Time Value Ref Range Status  10/25/2019 05:34 AM 7.5 (H) 4.8 - 5.6 % Final    Comment:    (NOTE) Pre diabetes:          5.7%-6.4% Diabetes:              >6.4% Glycemic control for   <7.0% adults with diabetes   08/06/2019 10:26 AM 8.1 (H) 4.6 - 6.5 % Final    Comment:    Glycemic Control Guidelines for People with Diabetes:Non Diabetic:  <6%Goal of Therapy: <7%Additional Action Suggested:  >8%    CBG: Recent Labs  Lab 10/25/19 0520 10/25/19 1147 10/25/19 1831  GLUCAP 164* 167* 225*    Review of Systems:   Unable to obtain as pt is encephalopathic.  Past medical history  He,  has a past medical history of Anemia, Essential hypertension (07/18/2017), History of rheumatic fever, History of stroke (07/18/2017), Hypertriglyceridemia (07/18/2017), and Type 2 diabetes mellitus with diabetic neuropathy, unspecified ([place]) (07/18/2017).   Surgical History    Past Surgical History:  Procedure Laterality Date  .  FOOT CAPSULOTOMY Left 07/25/2008   Mid Foot #2 MPJ  . Hammertoe Repair Left 07/25/2008   #2 toe  . SPINAL FUSION    . TARSAL TUNNEL RELEASE Left 07/25/2008     Social History   reports that he has quit smoking. His smoking use included cigarettes. He has never used smokeless tobacco. He reports that he does not drink alcohol or use drugs.   Family history   His family history includes Hyperlipidemia in his father and mother. There is no history of Cancer.   Allergies Allergies  Allergen Reactions  . Influenza Vaccines Other (See Comments)  . Aspirin     Other reaction(s): Other (See Comments) Gastroesophageal reflux Unspecified    . Atorvastatin Other (See Comments)  Myalgias GI upset Unspecified    . Canagliflozin     Other reaction(s): Other (See Comments) Pt reports that the use of Invokana caused acute kidney failure  Unspecified    . Statins     Other reaction(s): Myalgias (intolerance)     Home meds  Prior to Admission medications   Medication Sig Start Date End Date Taking? Authorizing Provider  carvedilol (COREG) 12.5 MG tablet TAKE 1 TABLET TWICE DAILY  WITH MEALS 02/21/19   Wendling, Crosby Oyster, DO  clobetasol cream (TEMOVATE) [id number] % Apply 1 application topically 2 (two) times daily. 07/13/18   [name], DO  fenofibrate micronized (LOFIBRA) 200 MG capsule TAKE 1 CAPSULE DAILY BEFOREBREAKFAST 02/21/19   [name], DO  gabapentin (NEURONTIN) 600 MG tablet TAKE 1 TABLET 3 TIMES A DAY 08/06/19   [name], DO  hydrochlorothiazide (HYDRODIURIL) 25 MG tablet TAKE 1 TABLET DAILY 02/12/19   Wendling, Crosby Oyster, DO  Insulin Aspart (NOVOLOG [place]) Sliding scale    [provider]  insulin degludec (TRESIBA) 100 UNIT/ML SOPN FlexTouch Pen Inject 0.6 mLs (60 Units total) into the skin at bedtime. 09/26/19   [name], DO  lisinopril (ZESTRIL) 40 MG tablet TAKE 1 TABLET DAILY 02/21/19   [name], DO   lovastatin (MEVACOR) 10 MG tablet Take 1 tablet (10 mg total) by mouth at bedtime. 10/23/19   [name], DO  metFORMIN (GLUCOPHAGE) 500 MG tablet Take 500 mg by mouth 2 (two) times daily with a meal.    [provider]  Multiple Vitamins-Minerals (MULTIVITAMIN WITH MINERALS) tablet Take 1 tablet by mouth daily.    [provider]  vitamin B-12 (CYANOCOBALAMIN) 1000 MCG tablet Take 1,000 mcg by mouth daily.    [provider]    Montey Hora, [facility] 10/25/2019, 8:30 PM

## 2019-10-25 NOTE — Transfer of Care (Signed)
Immediate Anesthesia Transfer of Care Note  Patient: Tommy Green  Procedure(s) Performed: CRANIOTOMY HEMATOMA EVACUATION SUBDURAL (Left )  Patient Location: PACU  Anesthesia Type:General  Level of Consciousness: drowsy  Airway & Oxygen Therapy: Patient Spontanous Breathing and Patient connected to face mask oxygen  Post-op Assessment: Report given to RN and Post -op Vital signs reviewed and stable  Post vital signs: Reviewed and stable  Last Vitals:  Vitals Value Taken Time  BP 116/55 10/25/19 1830  Temp 36.4 C 10/25/19 1830  Pulse 80 10/25/19 1832  Resp 23 10/25/19 1832  SpO2 94 % 10/25/19 1832  Vitals shown include unvalidated device data.  Last Pain:  Vitals:   10/25/19 1100  TempSrc: Axillary  PainSc:          Complications: No apparent anesthesia complications

## 2019-10-25 NOTE — Progress Notes (Signed)
SLP Cancellation Note  Patient Details Name: Tommy Green MRN: [id number] DOB: [birth year]   Cancelled treatment:       Reason Eval/Treat Not Completed: Patient at procedure or test/unavailable. Will f/u for cognitive linguistic eval.    Haruka Kowaleski, Katherene Ponto 10/25/2019, 10:32 AM

## 2019-10-25 NOTE — Progress Notes (Signed)
PT Cancellation Note  Patient Details Name: Tommy Green MRN: [id number] DOB: August 04, 1946   Cancelled Treatment:    Reason Eval/Treat Not Completed: Patient not medically ready - Pt awaiting craniotomy, with active bedrest orders. PT to check back as appropriate.  [place] Pager [phone]  Office [phone]    [place] 10/25/2019, 11:05 AM

## 2019-10-25 NOTE — Brief Op Note (Signed)
10/25/2019  6:30 PM  PATIENT:  Tommy Green  74 y.o. male  PRE-OPERATIVE DIAGNOSIS:  hematoma  POST-OPERATIVE DIAGNOSIS:  hematoma  PROCEDURE:  Procedure(s): CRANIOTOMY HEMATOMA EVACUATION SUBDURAL (Left)  SURGEON:  Surgeon(s) and Role:    Marcello Moores, Dorcas Carrow, MD - Primary    * Ashok Pall, MD - Assisting  PHYSICIAN ASSISTANT:   ASSISTANTS: Ashok Pall, MD   ANESTHESIA:   general  EBL:  250 mL   BLOOD ADMINISTERED:none  DRAINS: Hemovac drain   SPECIMEN:  Subdural membrane  DISPOSITION OF SPECIMEN:  Permanent section  COUNTS:  YES  TOURNIQUET:  * No tourniquets in log *  DICTATION: .Dragon Dictation  PLAN OF CARE: Admit to inpatient   PATIENT DISPOSITION:  ICU - extubated and stable.   Delay start of Pharmacological VTE agent (>24hrs) due to surgical blood loss or risk of bleeding: yes

## 2019-10-25 NOTE — Anesthesia Preprocedure Evaluation (Addendum)
Anesthesia Evaluation  Patient identified by MRN, date of birth, ID bandGeneral Assessment Comment:Patient awake  Reviewed: Allergy & Precautions, NPO status , Patient's Chart, lab work & pertinent test results  Airway Mallampati: III  TM Distance: >3 FB Neck ROM: Full    Dental no notable dental hx.    Pulmonary former smoker,    Pulmonary exam normal breath sounds clear to auscultation       Cardiovascular hypertension, Pt. on home beta blockers and Pt. on medications Normal cardiovascular exam Rhythm:Regular Rate:Normal  ECG: rate 81. Sinus rhythm with frequent Premature ventricular complexes   Neuro/Psych chronic loculated left subdural hematoma with associated mass effect negative psych ROS   GI/Hepatic negative GI ROS, Neg liver ROS,   Endo/Other  diabetes, Insulin Dependent, Oral Hypoglycemic AgentsMorbid obesity  Renal/GU Renal InsufficiencyRenal disease     Musculoskeletal Weakness in arms and legs Frequent falls   Abdominal (+) + obese,   Peds  Hematology  (+) anemia , HLD   Anesthesia Other Findings SD hematoma  Reproductive/Obstetrics                           Anesthesia Physical Anesthesia Plan  ASA: III and emergent  Anesthesia Plan: General   Post-op Pain Management:    Induction: Intravenous  PONV Risk Score and Plan: 2 and Ondansetron, Dexamethasone and Treatment may vary due to age or medical condition  Airway Management Planned: Oral ETT  Additional Equipment: Arterial line  Intra-op Plan:   Post-operative Plan: Possible Post-op intubation/ventilation and Extubation in OR  Informed Consent: I have reviewed the patients History and Physical, chart, labs and discussed the procedure including the risks, benefits and alternatives for the proposed anesthesia with the patient or authorized representative who has indicated his/her understanding and acceptance.      Dental advisory given  Plan Discussed with: CRNA  Anesthesia Plan Comments:       Anesthesia Quick Evaluation

## 2019-10-25 NOTE — Progress Notes (Signed)
eLink Physician-Brief Progress Note Patient Name: Tommy Green DOB: [birth year] MRN: [id number]   Date of Service  10/25/2019  HPI/Events of Note  Hyperkalemia - K+ = 5.5. Patient on 0.9 NaCl + 20 meq KCL/Liter IV fluid.   eICU Interventions  Will order: 1. D/C 0/9 NaCl + 20 meq KCL/Liter at 75 mL/hour. 2. 0.9 NaCl at 75 mL/hour.  3. Repeat BMP at 5 AM.     Intervention Category Major Interventions: Electrolyte abnormality - evaluation and management  Sabella Traore [place] 10/25/2019, 11:23 PM

## 2019-10-25 NOTE — Progress Notes (Signed)
Patient awake and alert in PACU, but not answering questions. He is moving all four extremities independently, but is not following commands. Dr. Marcello Moores at bedside and is aware. Will continue to monitor.

## 2019-10-26 ENCOUNTER — Inpatient Hospital Stay (HOSPITAL_COMMUNITY): Payer: Medicare HMO

## 2019-10-26 ENCOUNTER — Encounter: Payer: Self-pay | Admitting: *Deleted

## 2019-10-26 DIAGNOSIS — G9340 Encephalopathy, unspecified: Secondary | ICD-10-CM

## 2019-10-26 DIAGNOSIS — S065X9A Traumatic subdural hemorrhage with loss of consciousness of unspecified duration, initial encounter: Principal | ICD-10-CM

## 2019-10-26 DIAGNOSIS — N179 Acute kidney failure, unspecified: Secondary | ICD-10-CM

## 2019-10-26 LAB — URINALYSIS, MICROSCOPIC (REFLEX): RBC / HPF: >50 RBC/hpf (ref 0–5)

## 2019-10-26 LAB — POCT I-STAT 7, (LYTES, BLD GAS, ICA,H+H)
Acid-base deficit: 12 mmol/L — ABNORMAL HIGH (ref 0.0–2.0)
Bicarbonate: 12.5 mmol/L — ABNORMAL LOW (ref 20.0–28.0)
Calcium, Ion: 1.12 mmol/L — ABNORMAL LOW (ref 1.15–1.40)
HCT: 21 % — ABNORMAL LOW (ref 39.0–52.0)
Hemoglobin: 7.1 g/dL — ABNORMAL LOW (ref 13.0–17.0)
O2 Saturation: 93 %
Patient temperature: 98.8
Potassium: 4.3 mmol/L (ref 3.5–5.1)
Sodium: 143 mmol/L (ref 135–145)
TCO2: 13 mmol/L — ABNORMAL LOW (ref 22–32)
pCO2 arterial: 22.7 mmHg — ABNORMAL LOW (ref 32.0–48.0)
pH, Arterial: 7.351 (ref 7.350–7.450)
pO2, Arterial: 68 mmHg — ABNORMAL LOW (ref 83.0–108.0)

## 2019-10-26 LAB — BASIC METABOLIC PANEL
Anion gap: 13 (ref 5–15)
BUN: 71 mg/dL — ABNORMAL HIGH (ref 8–23)
CO2: 16 mmol/L — ABNORMAL LOW (ref 22–32)
Calcium: 8.3 mg/dL — ABNORMAL LOW (ref 8.9–10.3)
Chloride: 112 mmol/L — ABNORMAL HIGH (ref 98–111)
Creatinine, Ser: 2.23 mg/dL — ABNORMAL HIGH (ref 0.61–1.24)
GFR calc Af Amer: 32 mL/min — ABNORMAL LOW (ref >60)
GFR calc non Af Amer: 28 mL/min — ABNORMAL LOW (ref >60)
Glucose, Bld: 237 mg/dL — ABNORMAL HIGH (ref 70–99)
Potassium: 4.8 mmol/L (ref 3.5–5.1)
Sodium: 141 mmol/L (ref 135–145)

## 2019-10-26 LAB — CBC
HCT: 25.5 % — ABNORMAL LOW (ref 39.0–52.0)
Hemoglobin: 8 g/dL — ABNORMAL LOW (ref 13.0–17.0)
MCH: 26.9 pg (ref 26.0–34.0)
MCHC: 31.4 g/dL (ref 30.0–36.0)
MCV: 85.9 fL (ref 80.0–100.0)
Platelets: 355 10*3/uL (ref 150–400)
RBC: 2.97 MIL/uL — ABNORMAL LOW (ref 4.22–5.81)
RDW: 15.1 % (ref 11.5–15.5)
WBC: 13.4 10*3/uL — ABNORMAL HIGH (ref 4.0–10.5)
nRBC: 0 % (ref 0.0–0.2)

## 2019-10-26 LAB — URINALYSIS, ROUTINE W REFLEX MICROSCOPIC
Bilirubin Urine: NEGATIVE
Glucose, UA: NEGATIVE mg/dL
Ketones, ur: NEGATIVE mg/dL
Nitrite: NEGATIVE
Protein, ur: NEGATIVE mg/dL
Specific Gravity, Urine: 1.02 (ref 1.005–1.030)
pH: 5 (ref 5.0–8.0)

## 2019-10-26 LAB — GLUCOSE, CAPILLARY
Glucose-Capillary: 244 mg/dL — ABNORMAL HIGH (ref 70–99)
Glucose-Capillary: 179 mg/dL — ABNORMAL HIGH (ref 70–99)
Glucose-Capillary: 197 mg/dL — ABNORMAL HIGH (ref 70–99)
Glucose-Capillary: 175 mg/dL — ABNORMAL HIGH (ref 70–99)
Glucose-Capillary: 152 mg/dL — ABNORMAL HIGH (ref 70–99)
Glucose-Capillary: 167 mg/dL — ABNORMAL HIGH (ref 70–99)

## 2019-10-26 IMAGING — CT CT HEAD W/O CM
4 series · 15 of 47 positions shown, 17 images · non-contrast
Comparison: Head CT [DATE]

CLINICAL DATA: Craniotomy, postop

EXAM:
CT HEAD WITHOUT CONTRAST
TECHNIQUE: Contiguous axial images were obtained from the base of the skull
through the vertex without intravenous contrast.

[Series 3: head bone · axial · 0.45mm/px · z∈[-142,-122]mm · 2 of 96 slices shown]
[im 10/96  bone]
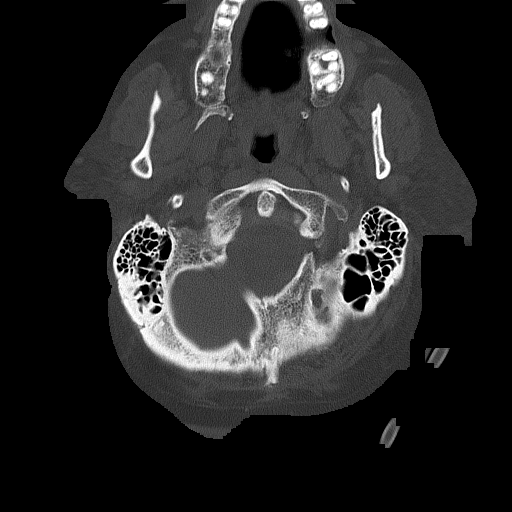
[im 20/96  bone]
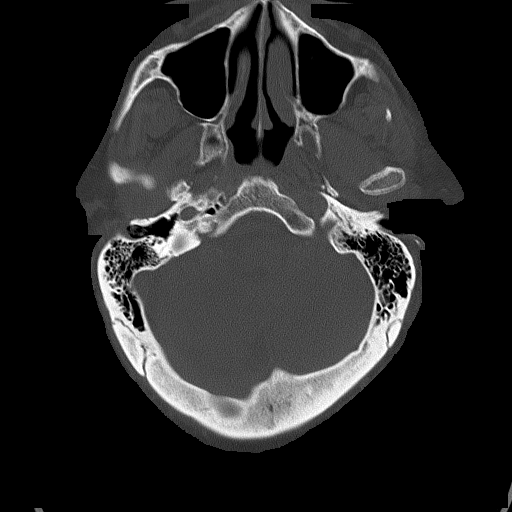

[Series 4: head without · axial · non-contrast · 0.45mm/px · z∈[-140,+5]mm · 7 of 39 slices shown, 9 images]
[im 5/39  brain]
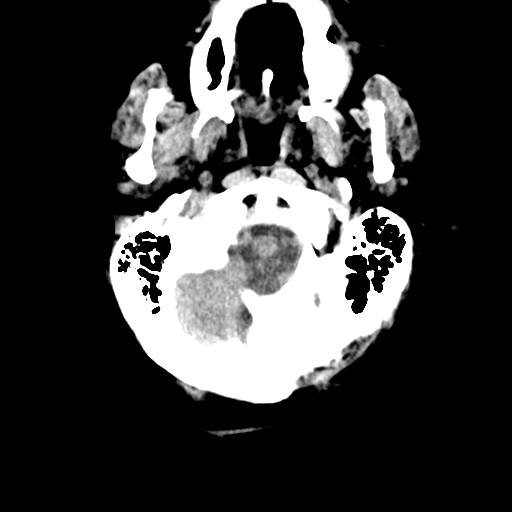
[im 5/39  bone]
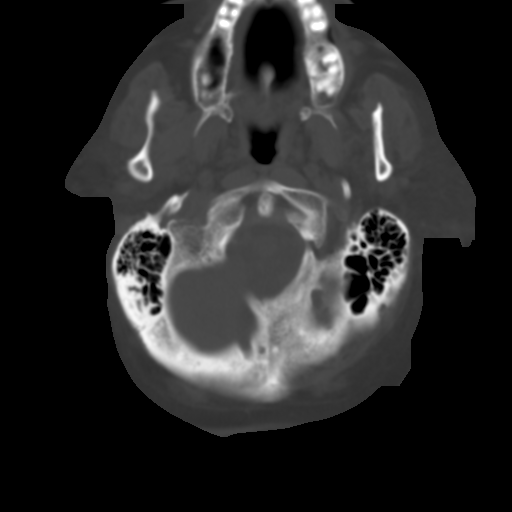
[im 10/39  brain]
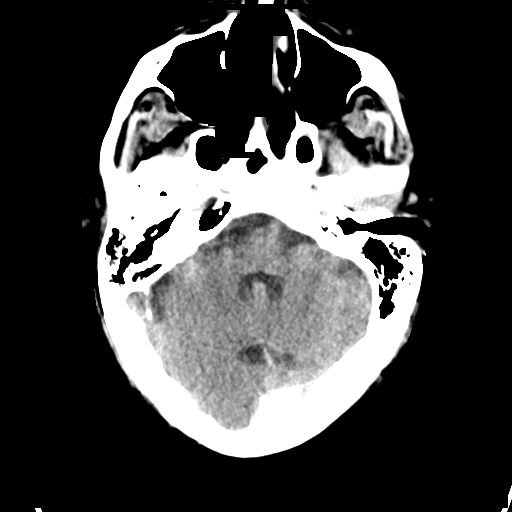
[im 15/39  brain]
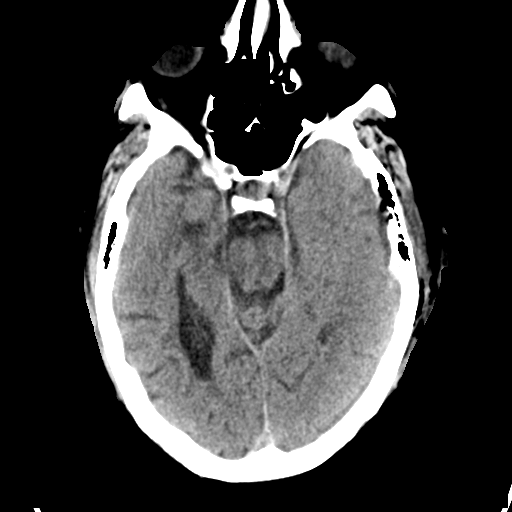
[im 20/39  brain]
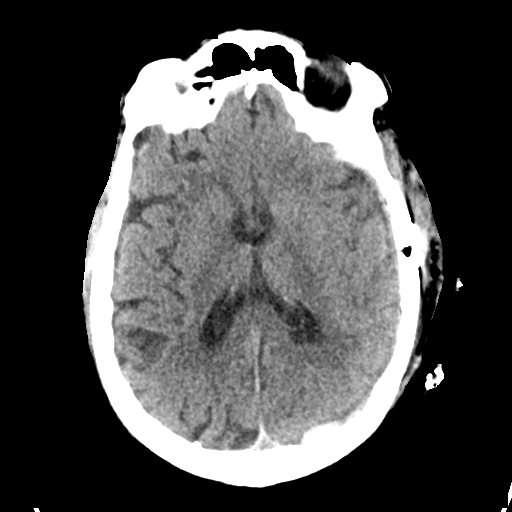
[im 24/39  brain]
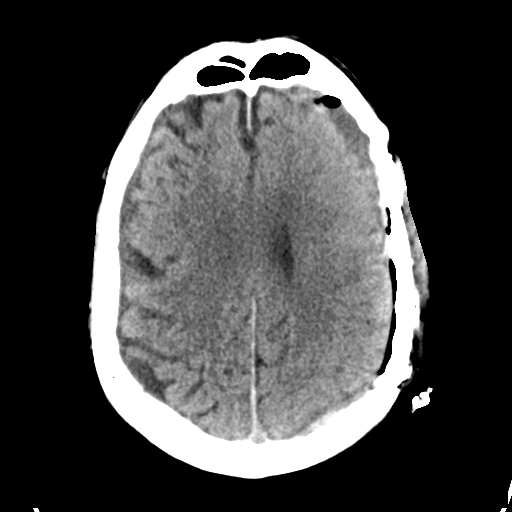
[im 24/39  bone]
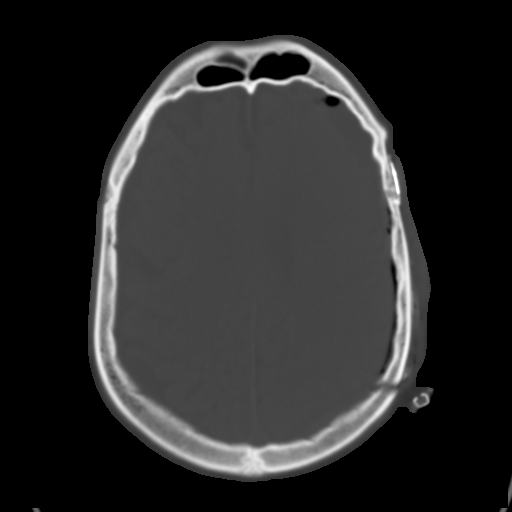
[im 29/39  brain]
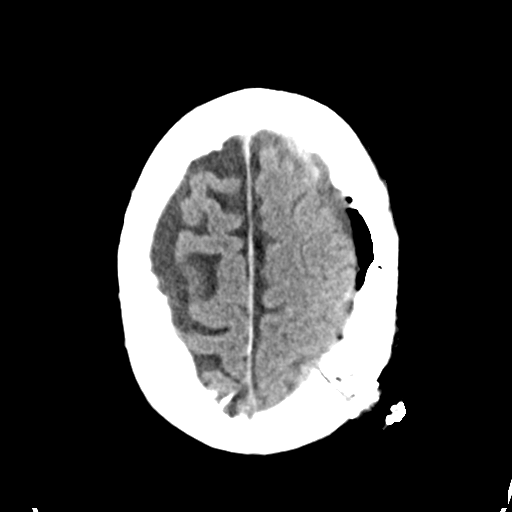
[im 34/39  brain]
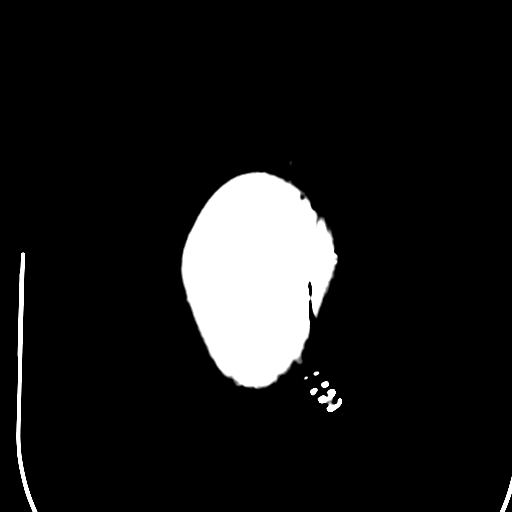

[Series 5: head without cor · coronal · non-contrast · 0.37mm/px · 3 of 77 slices shown]
[im 26/77  brain]
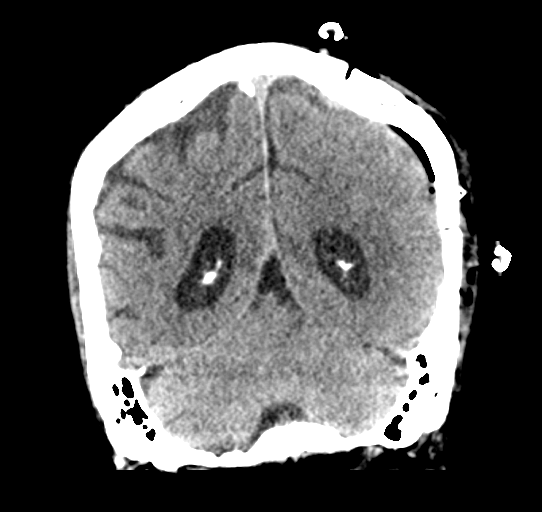
[im 34/77  brain]
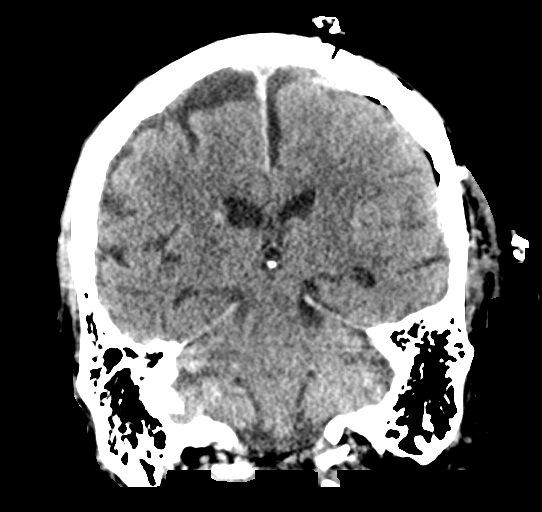
[im 43/77  brain]
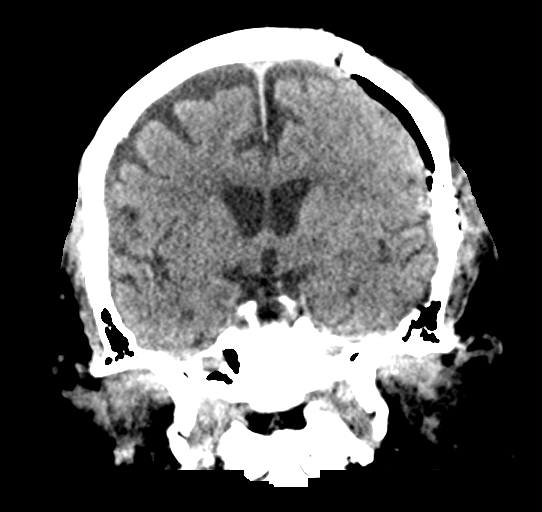

[Series 6: head without sag · sagittal · non-contrast · 0.35mm/px · 3 of 67 slices shown]
[im 23/67  brain]
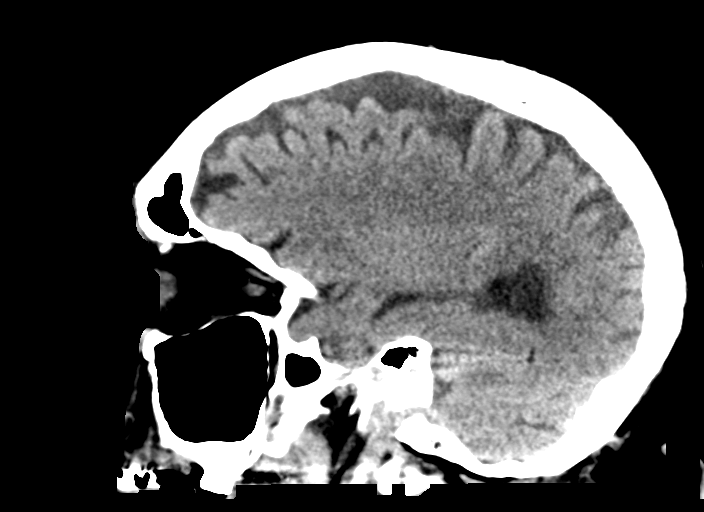
[im 34/67  brain]
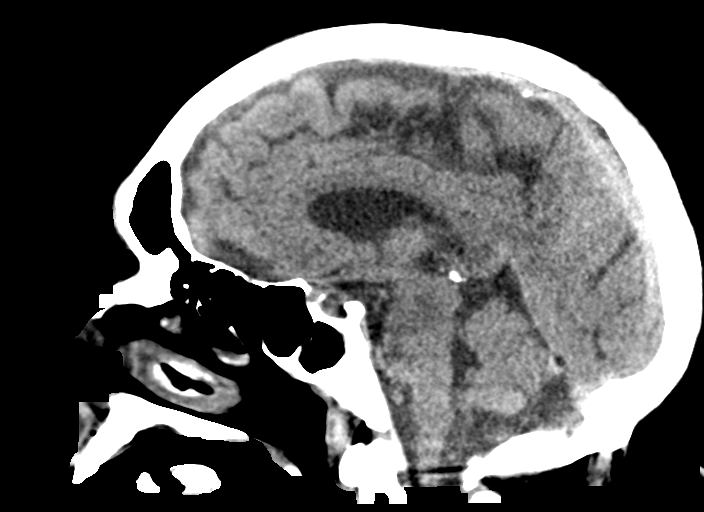
[im 45/67  brain]
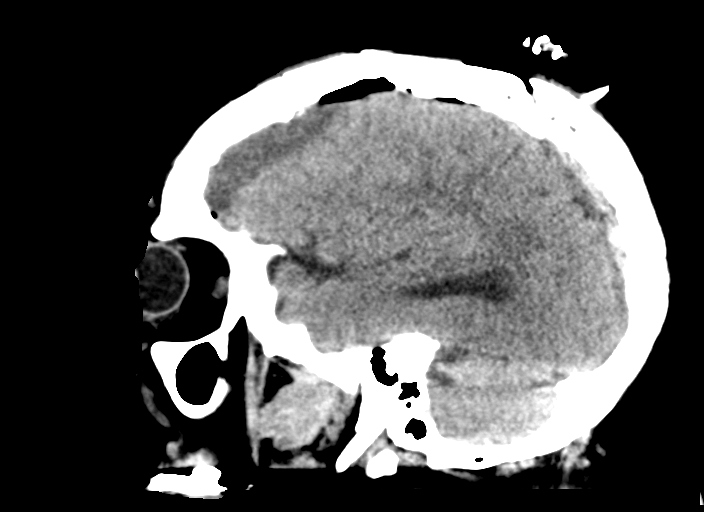

[15 of 47 positions shown; findings below may reference images not displayed]

FINDINGS: Brain:

There has been interval left craniotomy for left subdural hematoma
evacuation. Persistent although significantly decreased mixed
density left subdural hematoma containing a small amount of air. The
hematoma now measures up to 1.3 cm in greatest thickness (measured
overlying the left frontal lobe on coronal reconstruction).
Persistent although decreased mass effect upon the underlying left
cerebral hemisphere. Midline shift has essentially resolved.

New from prior examination, there is small volume acute hemorrhage
layering dependently within the posterior aspect of the lateral
ventricles (greater on the right). Trace subarachnoid hemorrhage is
also questioned overlying anterolateral right frontal lobe (series
4, images 19,20).

No evidence of hydrocephalus. No demarcated cortical infarct. Stable
generalized parenchymal atrophy and chronic small vessel ischemic
disease.

Vascular: No hyperdense vessel.

Skull: Left-sided cranioplasty with overlying scalp drain and scalp
staples.

Sinuses/Orbits: Visualized orbits demonstrate no acute abnormality.
Small left maxillary sinus mucous retention cyst. No significant
mastoid effusion.
IMPRESSION: Sequela of interval left subdural hematoma evacuation. Persistent
although significantly decreased mixed density left holohemispheric
subdural hematoma, now measuring 1.3 cm in thickness. Persistent
although decreased mass effect upon the underlying left cerebral
hemisphere. Midline shift has essentially resolved.

Small volume acute hemorrhage layering dependently within the
lateral ventricles (greater on the right), new from prior exam.
Trace subarachnoid hemorrhage is also questioned along the
anterolateral right frontal lobe. Attention recommended on
follow-up.

No hydrocephalus or evidence of ischemic infarction.

## 2019-10-26 MED ORDER — LORAZEPAM 2 MG/ML IJ SOLN
1.0000 mg | Freq: Once | INTRAMUSCULAR | Status: AC
  Administered 2019-10-26: 14:00:00 1 mg via INTRAVENOUS
  Filled 2019-10-26: qty 1

## 2019-10-26 MED ORDER — ORAL CARE MOUTH RINSE
15.0000 mL | Freq: Two times a day (BID) | OROMUCOSAL | Status: DC
  Administered 2019-10-26: 15 mL via OROMUCOSAL

## 2019-10-26 MED ORDER — LORAZEPAM 2 MG/ML IJ SOLN
2.0000 mg | INTRAMUSCULAR | Status: DC | PRN
  Administered 2019-10-26 – 2019-10-28 (×11): 2 mg via INTRAVENOUS
  Filled 2019-10-26 (×11): qty 1

## 2019-10-26 MED ORDER — DEXMEDETOMIDINE HCL IN NACL 400 MCG/100ML IV SOLN: 0.2000 ug/kg/h | INTRAVENOUS | Status: DC

## 2019-10-26 MED ORDER — LORAZEPAM 2 MG/ML IJ SOLN: 1.0000 mg | Freq: Once | INTRAMUSCULAR | Status: DC

## 2019-10-26 MED FILL — Thrombin For Soln 5000 Unit: CUTANEOUS | Qty: 5000 | Status: AC

## 2019-10-26 NOTE — Evaluation (Signed)
Clinical/Bedside Swallow Evaluation Patient Details  Name: Tommy Green MRN: [id number] Date of Birth: 1946/05/20  Today's Date: 10/26/2019 Time: SLP Start Time (ACUTE ONLY): [id number] SLP Stop Time (ACUTE ONLY): 1001 SLP Time Calculation (min) (ACUTE ONLY): 19 min  Past Medical History:  Past Medical History:  Diagnosis Date  . Anemia   . Essential hypertension 07/18/2017  . History of rheumatic fever   . History of stroke 07/18/2017  . Hypertriglyceridemia 07/18/2017  . Type 2 diabetes mellitus with diabetic neuropathy, unspecified ([place]) 07/18/2017   Past Surgical History:  Past Surgical History:  Procedure Laterality Date  . FOOT CAPSULOTOMY Left 07/25/2008   Mid Foot #2 MPJ  . Hammertoe Repair Left 07/25/2008   #2 toe  . SPINAL FUSION    . TARSAL TUNNEL RELEASE Left 07/25/2008   HPI:  Antionio Schmelzle Orihuela is a 74 y.o. male who has a PMH w prior CVA, diabetes, hyperlipidemia, an hypertension.  He suffered blunt head trauma a few weeks back and had no concerning finding son imaging.  Subsequently developed confusion, aphasia, mobility problems to the point he could no longer perform any ADLs. He presented to [facility] ED 1/6 and CT head showed loculated AoC left SDH with mass effect. Pt is now post evacuation of SDH on 1/7.  CXR 1/7 with hazy opacities, pneumonia not excluded.   Assessment / Plan / Recommendation Clinical Impression  Pt presents with moderate-severe oral dysphagia 2/2 change in mental status.  Pt exhibited very poor bolus retrieval and awareness.  Pt required verbal cues for oral response to ice chip.  There was anterior spillage of liquid and puree, oral holding, decreased A-P transit with significant oral residue. Suction was used to remove large portions of bolus trials from oral cavity.  Pharyngeal swallow was palpated with seemingly decreased layrngeal elevation.  There was dry cough x1 following trial of thin liquid by cup, but pt denied feeling of liquid going down the wrong  way.  Based on severity of oral deficits, pt is not appropriate for oral diet at this time.  Change in mentation occurred post-op, and may be related to SDH evaluation.  Expect that mental status should continue to improve with time post procedure.  Recommend pt remain NPO at present.  Consider alternate means of nutrition, hydration, and medication dependending on expected length of post-op delirium.  SLP will follow for PO readiness. SLP Visit Diagnosis: Dysphagia, oral phase (R13.11)    Aspiration Risk  Moderate aspiration risk    Diet Recommendation NPO;Alternative means - temporary        Other  Recommendations Oral Care Recommendations: Oral care QID   Follow up Recommendations (TBD)      Frequency and Duration min 2x/week  2 weeks       Prognosis Prognosis for Safe Diet Advancement: Good      Swallow Study   General HPI: Samin Hadsell Schmoker is a 74 y.o. male who has a PMH w prior CVA, diabetes, hyperlipidemia, an hypertension.  He suffered blunt head trauma a few weeks back and had no concerning finding son imaging.  Subsequently developed confusion, aphasia, mobility problems to the point he could no longer perform any ADLs. He presented to [facility] ED 1/6 and CT head showed loculated AoC left SDH with mass effect. Pt is now post evacuation of SDH on 1/7.  CXR 1/7 with hazy opacities, pneumonia not excluded. Type of Study: Bedside Swallow Evaluation Diet Prior to this Study: Regular;Thin liquids Temperature Spikes Noted: No Respiratory  Status: Nasal cannula History of Recent Intubation: Yes Length of Intubations (days): 1 days(for procedure, extubated in PACU) Date extubated: 10/25/19 Behavior/Cognition: Alert;Cooperative;Confused;Requires cueing;Doesn't follow directions Oral Cavity Assessment: Dry Oral Care Completed by SLP: Yes Oral Cavity - Dentition: Adequate natural dentition Vision: Impaired for self-feeding Self-Feeding Abilities: Total assist Patient Positioning:  Upright in bed Baseline Vocal Quality: Normal Volitional Swallow: Able to elicit    Oral/Motor/Sensory Function Overall Oral Motor/Sensory Function: Mild impairment Facial ROM: Reduced right Lingual ROM: Reduced right;Reduced left Lingual Strength: Reduced Velum: Within Functional Limits Mandible: Within Functional Limits   Ice Chips Ice chips: Impaired Oral Phase Impairments: Poor awareness of bolus;Impaired mastication Oral Phase Functional Implications: Oral holding   Thin Liquid Thin Liquid: Impaired Oral Phase Impairments: Reduced labial seal;Reduced lingual movement/coordination;Poor awareness of bolus Oral Phase Functional Implications: Oral residue;Oral holding;Left anterior spillage;Right anterior spillage Pharyngeal  Phase Impairments: Cough - Delayed    Nectar Thick Nectar Thick Liquid: Not tested   Honey Thick Honey Thick Liquid: Not tested   Puree Puree: Impaired Presentation: Spoon Oral Phase Impairments: Reduced labial seal;Reduced lingual movement/coordination;Poor awareness of bolus Oral Phase Functional Implications: Right anterior spillage;Left anterior spillage;Prolonged oral transit;Oral holding;Oral residue;Right lateral sulci pocketing;Left lateral sulci pocketing   Solid     Solid: Not tested      Celedonio Savage, MA, [place] Office: [phone]  10/26/2019,10:21 AM

## 2019-10-26 NOTE — Progress Notes (Signed)
Inpatient Diabetes Program Recommendations  AACE/ADA: New Consensus Statement on Inpatient Glycemic Control (2015)  Target Ranges:  Prepandial:   less than 140 mg/dL      Peak postprandial:   less than 180 mg/dL (1-2 hours)      Critically ill patients:  140 - 180 mg/dL   Lab Results  Component Value Date   GLUCAP 179 (H) 10/26/2019   HGBA1C 7.5 (H) 10/25/2019    Review of Glycemic Control Results for Tommy Green, Tommy Green (MRN [id number]) as of 10/26/2019 09:39  Ref. Range 10/25/2019 19:58 10/25/2019 23:26 10/26/2019 03:28 10/26/2019 08:46  Glucose-Capillary Latest Ref Range: 70 - 99 mg/dL 239 (H) 265 (H) 244 (H) 179 (H)   Diabetes history: Type 2 DM Outpatient Diabetes medications: Tresiba 60 units QHS, Metformin 500 mg BID Current orders for Inpatient glycemic control: Novolog 0-15 units Q4H Decadron 10 mg x 1 on 1/7  Inpatient Diabetes Program Recommendations:    Would consider adding back a portion of patient's home basal: Lantus 16 units QHS.   Thanks, Bronson Curb, MSN, RNC-OB Diabetes Coordinator [phone] (8a-5p)

## 2019-10-26 NOTE — Progress Notes (Signed)
Patient placed on 8L HFNC due to PAO2 of 68. Patient currently maintaining saturations in the 90's. RT will continue to monitor pt.

## 2019-10-26 NOTE — Evaluation (Signed)
Physical Therapy Evaluation Patient Details Name: Tommy Green MRN: [id number] DOB: Aug 29, 1946 Today's Date: 10/26/2019   History of Present Illness  Tommy Green is a 74 y.o. M s/p craniotomy following L subdural hematoma. He sustained blunt force trauma to the head a few weeks ago with no findings, but presented to the ED with increasing confusion, aphasia, and mobility difficulties. PMH including but not limited to DM2, CKD, HTN, HLD, chronic pain, CVA s/p evac.  Clinical Impression  Pt admitted for above. He has deficits in strength, ROM, balance, gait, coordination, cognition, decreased safety awareness, impulsivity with movement, and overall functional mobility. He required mod assist +2 for all activities today, including bed mobility and transfers as described below. He demonstrates L sided weakness as compared to the right, with some tendencies of L-sided inattention. Throughout activities, tried to increase input through L side by WB through L UE and LE. Pt demonstrated fair balance, but requires significant cueing to stay on task and for sequencing. Per wife, he was not using an AD until very recently due to weakness over past few days. Pt's cognition is not at baseline, as his wife corrected his original answers about his home situation. He also demonstrates some expressive difficulties and did better by responding to yes/no questions rather than open-ended ones. Recommend SNF for continued therapy following acute discharge (wife prefers [facility]). He would benefit from continued skilled PT intervention while at hospital in order to maximize function prior to discharge.   Vitals: Start: 91 bpm, 98%, 137/92 mmHg End: 88 bpm, 97%, 118/52 mmHg    Follow Up Recommendations SNF;Supervision for mobility/OOB    Equipment Recommendations  None recommended by PT(TBD next venue of care)    Recommendations for Other Services OT consult     Precautions / Restrictions  Precautions Precautions: Fall Restrictions Weight Bearing Restrictions: No      Mobility  Bed Mobility Overal bed mobility: Needs Assistance Bed Mobility: Supine to Sit     Supine to sit: Mod assist;+2 for physical assistance;HOB elevated     General bed mobility comments: mod assist +2 for righting trunk and hand held assist to sit EOB  Transfers Overall transfer level: Needs assistance Equipment used: Rolling walker (2 wheeled) Transfers: Sit to/from [facility] Sit to Stand: Mod assist;+2 physical assistance Stand pivot transfers: Mod assist;+2 physical assistance       General transfer comment: mod assist +2 for walker management and steadiness, max cueing for sequencing; cues for hand placement for sit to stand, good hand placement with sitting in recliner  Ambulation/Gait                Stairs            Wheelchair Mobility    Modified Rankin (Stroke Patients Only)       Balance Overall balance assessment: Needs assistance Sitting-balance support: Bilateral upper extremity supported;Feet supported Sitting balance-Leahy Scale: Fair Sitting balance - Comments: pt sat with BUE supporting him but did not need physical assistance   Standing balance support: Bilateral upper extremity supported;During functional activity Standing balance-Leahy Scale: Poor Standing balance comment: reliant on BUE on RW                             Pertinent Vitals/Pain Pain Assessment: Faces Faces Pain Scale: No hurt    Home Living Family/patient expects to be discharged to:: Private residence Living Arrangements: Spouse/significant other Available Help at Discharge: Family(not  all of the time; she has back issues ) Type of Home: House Home Access: Stairs to enter Entrance Stairs-Rails: Right Entrance Stairs-Number of Steps: 3 Home Layout: One level Home Equipment: Walker - 2 wheels;Cane - quad;Wheelchair - manual      Prior  Function Level of Independence: Independent         Comments: used walker in past few days PTA due to increasing weakness; cane intermittently but mostly ambulated without an AD     Hand Dominance   Dominant Hand: Right    Extremity/Trunk Assessment   Upper Extremity Assessment Upper Extremity Assessment: LUE deficits/detail LUE Deficits / Details: some volitional movement after tactile and verbal cues; demonstrates weakness as he did not hold his arm up against gravity    Lower Extremity Assessment Lower Extremity Assessment: Generalized weakness;LLE deficits/detail LLE Deficits / Details: weakness more pronounced on L compared to R; some volitional movement and response to commands       Communication   Communication: Expressive difficulties  Cognition Arousal/Alertness: Lethargic Behavior During Therapy: Flat affect Overall Cognitive Status: Impaired/Different from baseline Area of Impairment: Attention;Memory;Following commands;Safety/judgement;Awareness;Problem solving;Orientation                 Orientation Level: Disoriented to;Person;Place;Time;Situation Current Attention Level: Selective Memory: Decreased short-term memory;Decreased recall of precautions Following Commands: Follows one step commands inconsistently;Follows one step commands with increased time Safety/Judgement: Decreased awareness of safety;Decreased awareness of deficits Awareness: Emergent Problem Solving: Slow processing;Decreased initiation;Difficulty sequencing;Requires verbal cues;Requires tactile cues General Comments: need to repeat commands several times before he performs, and then he will forget soon (ex. kept crossing his legs at EOB, would uncross, then immediately re-cross); memory impaired as questions about home situation were not accurate until wife provided correct information; was calling out for his grandpa; takes ~10 seconds to follow commands      General Comments       Exercises     Assessment/Plan    PT Assessment Patient needs continued PT services  PT Problem List Decreased strength;Decreased range of motion;Decreased activity tolerance;Decreased balance;Decreased mobility;Decreased coordination;Decreased cognition;Decreased knowledge of use of DME;Decreased safety awareness;Decreased knowledge of precautions;Pain;Obesity       PT Treatment Interventions DME instruction;Gait training;Stair training;Functional mobility training;Therapeutic activities;Therapeutic exercise;Balance training;Neuromuscular re-education;Cognitive remediation;Patient/family education;Wheelchair mobility training;Modalities    PT Goals (Current goals can be found in the Care Plan section)  Acute Rehab PT Goals Patient Stated Goal: did not state PT Goal Formulation: With patient/family Time For Goal Achievement: 11/09/19 Potential to Achieve Goals: Fair    Frequency Min 3X/week   Barriers to discharge        Co-evaluation               AM-PAC PT "6 Clicks" Mobility  Outcome Measure Help needed turning from your back to your side while in a flat bed without using bedrails?: A Lot Help needed moving from lying on your back to sitting on the side of a flat bed without using bedrails?: A Lot Help needed moving to and from a bed to a chair (including a wheelchair)?: A Lot Help needed standing up from a chair using your arms (e.g., wheelchair or bedside chair)?: A Lot Help needed to walk in hospital room?: Total Help needed climbing 3-5 steps with a railing? : Total 6 Click Score: 10    End of Session Equipment Utilized During Treatment: Gait belt;Oxygen Activity Tolerance: Patient tolerated treatment well;Patient limited by lethargy Patient left: in chair;with call bell/phone within reach;with chair alarm  set;with family/visitor present Nurse Communication: Mobility status PT Visit Diagnosis: Other abnormalities of gait and mobility (R26.89);Unsteadiness on  feet (R26.81);Repeated falls (R29.6);Muscle weakness (generalized) (M62.81);Pain;Other symptoms and signs involving the nervous system (R29.898);Hemiplegia and hemiparesis Hemiplegia - Right/Left: Left Hemiplegia - dominant/non-dominant: Non-dominant Hemiplegia - caused by: Nontraumatic SAH    Time: [id number] PT Time Calculation (min) (ACUTE ONLY): 39 min   Charges:   PT Evaluation $PT Eval Moderate Complexity: 1 Mod PT Treatments $Therapeutic Activity: 8-22 mins $Neuromuscular Re-education: 8-22 mins        Christel Mormon, SPT   Tommy Green [facility] 10/26/2019, 12:35 PM

## 2019-10-26 NOTE — Progress Notes (Signed)
Subjective: Underwent craniotomy for SDH yesterday  Objective: Vital signs in last 24 hours: Temp:  [97.6 F (36.4 C)-98.8 F (37.1 C)] 97.9 F (36.6 C) (01/08 0800) Pulse Rate:  [76-102] 98 (01/08 0700) Resp:  [15-31] 17 (01/08 0700) BP: (111-163)/(55-108) 122/108 (01/08 0700) SpO2:  [88 %-98 %] 96 % (01/08 0700) Arterial Line BP: (104-147)/(50-76) 144/70 (01/08 0600) Weight:  [118.4 kg] 118.4 kg (01/07 1256)  Intake/Output from previous day: 01/07 0701 - 01/08 0700 In: 3165.3 [I.V.:2165.3; IV Piggyback:1000] Out: 1895 [id number]; Drains:170; Blood:250] Intake/Output this shift: No intake/output data recorded.  Eyes open spontaneously, PERRL Follows commands x 4.  Aphasic. Right pronator drift. Drain output 170 ml  Lab Results: Recent Labs    10/25/19 0534 10/26/19 0343 10/26/19 0422  WBC 12.1*  --  13.4*  HGB 9.0* 7.1* 8.0*  HCT 28.6* 21.0* 25.5*  PLT 341  --  355   BMET Recent Labs    10/25/19 2054 10/26/19 0343 10/26/19 0422  NA 138 143 141  K 5.5* 4.3 4.8  CL 111  --  112*  CO2 14*  --  16*  GLUCOSE 290*  --  237*  BUN 68*  --  71*  CREATININE 2.27*  --  2.23*  CALCIUM 8.2*  --  8.3*    Studies/Results: CT HEAD WO CONTRAST  Result Date: 10/26/2019 CLINICAL DATA:  Craniotomy, postop EXAM: CT HEAD WITHOUT CONTRAST TECHNIQUE: Contiguous axial images were obtained from the base of the skull through the vertex without intravenous contrast. COMPARISON:  Head CT 10/24/2019 FINDINGS: Brain: There has been interval left craniotomy for left subdural hematoma evacuation. Persistent although significantly decreased mixed density left subdural hematoma containing a small amount of air. The hematoma now measures up to 1.3 cm in greatest thickness (measured overlying the left frontal lobe on coronal reconstruction). Persistent although decreased mass effect upon the underlying left cerebral hemisphere. Midline shift has essentially resolved. New from prior examination,  there is small volume acute hemorrhage layering dependently within the posterior aspect of the lateral ventricles (greater on the right). Trace subarachnoid hemorrhage is also questioned overlying anterolateral right frontal lobe (series 4, images 19,20). No evidence of hydrocephalus. No demarcated cortical infarct. Stable generalized parenchymal atrophy and chronic small vessel ischemic disease. Vascular: No hyperdense vessel. Skull: Left-sided cranioplasty with overlying scalp drain and scalp staples. Sinuses/Orbits: Visualized orbits demonstrate no acute abnormality. Small left maxillary sinus mucous retention cyst. No significant mastoid effusion. IMPRESSION: Sequela of interval left subdural hematoma evacuation. Persistent although significantly decreased mixed density left holohemispheric subdural hematoma, now measuring 1.3 cm in thickness. Persistent although decreased mass effect upon the underlying left cerebral hemisphere. Midline shift has essentially resolved. Small volume acute hemorrhage layering dependently within the lateral ventricles (greater on the right), new from prior exam. Trace subarachnoid hemorrhage is also questioned along the anterolateral right frontal lobe. Attention recommended on follow-up. No hydrocephalus or evidence of ischemic infarction. Electronically Signed   By: Kellie Simmering DO   On: 10/26/2019 09:51   CT Head Wo Contrast  Addendum Date: 10/24/2019   ADDENDUM REPORT: 10/24/2019 16:40 ADDENDUM: Results were discussed with Dr. Malvin Johns at 4:36 p.m. [place] on October 24, 2019. Electronically Signed   By: Virgina Norfolk M.D.   On: 10/24/2019 16:40   Result Date: 10/24/2019 CLINICAL DATA:  Bilateral arm and leg weakness. EXAM: CT HEAD WITHOUT CONTRAST TECHNIQUE: Contiguous axial images were obtained from the base of the skull through the vertex without intravenous contrast. COMPARISON:  December 12, 2015 FINDINGS: Brain: A large, acute on subacute/chronic,  predominantly left frontal lobe subdural collection is seen. This area measures approximately 2.3 cm in maximum thickness and extends along the vertex. Mild involvement of the left parietal and left occipital regions is noted. Mass effect is seen on the adjacent sulci with diffuse sulcal effacement noted throughout the left hemisphere. There is approximately 5.53 mm left to right midline shift. There is mild cerebral atrophy with widening of the extra-axial spaces and ventricular dilatation. There are areas of decreased attenuation within the white matter tracts of the supratentorial brain, consistent with microvascular disease changes. Vascular: No hyperdense vessel or unexpected calcification. Skull: Normal. Negative for fracture or focal lesion. Sinuses/Orbits: No acute finding. Other: None. IMPRESSION: 1. Large, acute on subacute/chronic, predominantly left frontal lobe subdural collection, measuring up to 2.3 cm in maximum thickness. MRI correlation is recommended 2. Mass effect is seen on the adjacent sulci with approximately 5.53 mm left to right midline shift. 3. Chronic microvascular disease changes of the supratentorial brain. 4. Mild cerebral atrophy with widening of the extra-axial spaces and ventricular dilatation. Electronically Signed: By: Virgina Norfolk M.D. On: 10/24/2019 16:33   CT Cervical Spine Wo Contrast  Result Date: 10/24/2019 CLINICAL DATA:  Leg weakness for 2 weeks, known subdural hematoma EXAM: CT CERVICAL SPINE WITHOUT CONTRAST TECHNIQUE: Multidetector CT imaging of the cervical spine was performed without intravenous contrast. Multiplanar CT image reconstructions were also generated. COMPARISON:  None. FINDINGS: Alignment: Within normal limits. Skull base and vertebrae: 7 cervical segments are well visualized. Vertebral body height is well maintained. Facet hypertrophic changes are noted. No acute fracture or acute facet abnormality is noted. Osteophytic changes are noted  throughout the cervical spine. Soft tissues and spinal canal: Surrounding soft tissue structures are within normal limits. No significant disc pathology is seen. Upper chest: Negative. Other: None. IMPRESSION: Multilevel degenerative change without acute abnormality. Electronically Signed   By: Inez Catalina M.D.   On: 10/24/2019 17:31   DG CHEST PORT 1 VIEW  Result Date: 10/25/2019 CLINICAL DATA:  Stroke, diabetes EXAM: PORTABLE CHEST 1 VIEW COMPARISON:  Radiograph Feb 18, 2006 FINDINGS: Lung volumes are low. Diffuse patchy interstitial and airspace opacities throughout the lungs with some cephalization and indistinctness of the pulmonary vascularity. Cardiac silhouette is enlarged though may be accentuated by the portable technique. No acute osseous or soft tissue abnormality. Degenerative changes are present in the imaged spine and shoulders. IMPRESSION: Diffuse patchy interstitial and airspace opacities throughout the lungs with cephalization and indistinctness of the pulmonary vascularity. Findings are compatible with pulmonary edema. Superimposed pneumonia cannot be excluded. Likely mild cardiomegaly. Electronically Signed   By: Lovena Le M.D.   On: 10/25/2019 06:39   DG Knee Complete 4 Views Left  Result Date: 10/24/2019 CLINICAL DATA:  Pain status post fall EXAM: LEFT KNEE - COMPLETE 4+ VIEW COMPARISON:  None. FINDINGS: There are tricompartmental degenerative changes. There is no displaced fracture. No dislocation. No significant joint effusion. IMPRESSION: Tricompartmental degenerative changes without acute bony abnormality. No significant joint effusion. Electronically Signed   By: Constance Holster M.D.   On: 10/24/2019 18:43   DG Hip Unilat W or Wo Pelvis 2-3 Views Left  Result Date: 10/24/2019 CLINICAL DATA:  Fall EXAM: DG HIP (WITH OR WITHOUT PELVIS) 2-3V LEFT COMPARISON:  None. FINDINGS: There is no acute displaced fracture or dislocation. There are advanced degenerative changes of both  hips, left worse than right. Phleboliths project over the patient's pelvis. Vascular calcifications are noted.  IMPRESSION: Advanced degenerative changes of both hips, left worse than right. No acute displaced fracture or dislocation. Electronically Signed   By: Constance Holster M.D.   On: 10/24/2019 18:42    Assessment/Plan: POD#1 s/p craniotomy for evacuation of loculated subdural hematoma - CT head without contrast reviewed.  Excellent evacuation.  Brain still somewhat sunken but subfalcine herniation has resolved. - neurologically, he is making a slow but steady recovery from surgery - Will need PT/OT, possible rehab - swallow study today - continue ICU - will d/c drain when output clears or decreases   LOS: 2 days     Vallarie Mare 10/26/2019, 11:40 AM

## 2019-10-26 NOTE — Progress Notes (Signed)
eLink Physician-Brief Progress Note Patient Name: Tommy Green DOB: 12/02/1945 MRN: [id number]   Date of Service  10/26/2019  HPI/Events of Note  Agitation - Request for bilateral soft wrist and soft waist belt restraint.   eICU Interventions  Will order: 1. Bilateral soft wrist and soft waste belt restraint X 2 hours.      Intervention Category Major Interventions: Delirium, psychosis, severe agitation - evaluation and management  Tommy Green 10/26/2019, 6:40 AM

## 2019-10-26 NOTE — Progress Notes (Signed)
EEG complete - results pending 

## 2019-10-26 NOTE — Procedures (Addendum)
Patient Name: Tommy Green  MRN: [id number]  Epilepsy Attending: Lora Havens  Referring Physician/Provider: Dr. Duffy Rhody Date: 10/26/2019  Duration: 22.58 mins  Patient history: 74 year old male with left SDH status post craniotomy.  EEG to evaluate for seizures.  Level of alertness: Awake/confused  AEDs during EEG study: Keppra  Technical aspects: This EEG study was done with scalp electrodes positioned according to the 10-20 International system of electrode placement. Electrical activity was acquired at a sampling rate of 500Hz  and reviewed with a high frequency filter of 70Hz  and a low frequency filter of 1Hz . EEG data were recorded continuously and digitally stored.   Description: During awake state, no clear posterior dominant rhythm was seen.  EEG showed continuous generalized polymorphic 3 to 6 Hz theta-delta slowing.  Sharp transients were seen in left frontotemporal region.  Hyperventilation and photic stimulation were not performed.  Abnormality -Continuous slow, generalized  IMPRESSION: This study showed evidence of moderate to severe diffuse encephalopathy, nonspecific etiology. No seizures or definite epileptiform discharges were seen throughout the recording.

## 2019-10-26 NOTE — Progress Notes (Signed)
NAME:  Tommy Green, MRN:  [id number], DOB:  10-31-1945, LOS: 2 ADMISSION DATE:  10/24/2019, CONSULTATION DATE:  10/25/19 REFERRING MD:  Marcello Moores  CHIEF COMPLAINT:  Vent management   Brief History   Tommy Green is a 74 y.o. male who was taken to OR 1/7 for craniotomy and evacuation of SDH.  He was extubated in PACU prior to transfer to ICU.  History of present illness   Pt is encephelopathic; therefore, this HPI is obtained from chart review. Tommy Green is a 74 y.o. male who has a PMH as outlined below.  He suffered blunt head trauma a few weeks back and had no concerning findings on imaging.  Subsequently developed confusion, aphasia, mobility problems to the point he could no longer perform any ADLs.  He presented to [facility] ED 1/6 and CT head showed loculated AoC left SDH with mass effect.  He was transferred to [facility] for neurosurgery evaluation.  On 1/7, he was taken to OR for craniotomy and evacuation of SDH.  PCCM consulted for vent management post op; however, pt was extubated in PACU prior to transfer to ICU.  Past Medical History  has Pain in joint, ankle and foot; Essential hypertension; Hyperlipidemia associated with type 2 diabetes mellitus ([place]); Type 2 diabetes mellitus with diabetic neuropathy, unspecified ([place]); Heart murmur; Hypertriglyceridemia; History of stroke; Skin lesion; Squamous cell carcinoma in situ (SCCIS) of skin; Granuloma annulare; Tendinopathy of right gluteus medius; Tendinopathy of left gluteus medius; Neoplasm of uncertain behavior; Bilateral hip pain; AK (actinic keratosis); Neuropathy; Cyclic vomiting syndrome; Chronic pain of right knee; and Subdural hematoma (HCC) on their problem list.  Significant Hospital Events   1/7 > admit.  Consults:  PCCM.  Procedures:  ETT 1/7 > 17  Significant Diagnostic Tests:  CT head 1/6 > large AoC L SDH with mass effect and 5.50mm L to R MLS. CT head 1/8 >    Micro Data:  SARS CoV 2 1/6 >  neg.  Antimicrobials:  None.   Interim history/subjective:  Afebrile In four-point restraints  Objective:  Blood pressure (!) 122/108, pulse 98, temperature 97.9 F (36.6 C), temperature source Oral, resp. rate 17, height 5\' 4"  (1.626 m), weight 118.4 kg, SpO2 96 %.        Intake/Output Summary (Last 24 hours) at 10/26/2019 1026 Last data filed at 10/26/2019 0700 Gross per 24 hour  Intake 3165.29 ml  Output 1895 ml  Net 1270.29 ml   Filed Weights   10/25/19 1256  Weight: 118.4 kg    Examination: General: Adult male, in NAD. Neuro: Sleepy, arouses easily, delayed answers to questions, follows one-step commands HEENT: [place]/AT. Sclerae anicteric.  Dry mucosa Cardiovascular: RRR, no M/R/G.  Lungs: Respirations even and unlabored.  CTA bilaterally, No W/R/R.  Abdomen: BS x 4, soft, NT/ND.  Musculoskeletal: No gross deformities, no edema.  Skin: Intact, warm, no rashes.  Assessment & Plan:   L AoC SDH - s/p evacuation 1/7. - Post op care per neurosurgery.  AKI.-Baseline creatinine 1.4-1.6 range Mild AG + NAGMA. Hyperkalemia resolved - Continue gentle hydration. -Do not restart lisinopril   Anemia. - Transfuse for Hgb < 7.  Hx DM. - SSI. - D/c lantus while NPO. - Hold home metformin, tresiba.  Hx HTN, HLD. - Nicardipine PRN for S per neurosurgery. - Hold home carvedilol, HCTZ, lisinopril, fenofibrate, lovastatin - can resume when able to take PO safely. -Plan is to swallow eval today and progress with PT  PCCM will be  available as needed   Best Practice:  Diet: NPO. Pain/Anxiety/Delirium protocol (if indicated): N/A. VAP protocol (if indicated): N/A. DVT prophylaxis: SCD's. GI prophylaxis: N/A. Glucose control: SSI. Mobility: Bedrest. Code Status: Full. Family Communication: Per primary. Disposition: ICU.  Kara Mead MD. Shade Flood. [facility]  If no response to pager , please call [phone]    10/26/2019, 10:26 AM

## 2019-10-26 NOTE — Op Note (Signed)
Procedure(s): CRANIOTOMY HEMATOMA EVACUATION SUBDURAL Procedure Note  Tommy Green male 74 y.o. 10/26/2019  Procedure(s) and Anesthesia Type:    * CRANIOTOMY HEMATOMA EVACUATION SUBDURAL - General  Surgeon(s) and Role:    Marcello Moores, Dorcas Carrow, MD - Primary    Ashok Pall, MD - Assisting   Indications: This is a 74 year old man who presented with progressive neurologic deterioration with confusion, speech difficulties, and right-sided weakness who was found to have a large left acute on chronic subdural hematoma with significant loculations.  I had a long discussion with the patient's wife regarding treatment options.  Given the significant mass-effect in the patient's progressive neurologic decline, I recommended surgical evacuation.  Given the significant loculations that appeared thick on CT, I recommended a craniotomy.  Risks, benefits, alternatives, and expected convalescence were discussed with her as patient was too confused to consent for surgery.  Risks discussed included but were not limited to bleeding, pain, infection, seizure, stroke, scar, subdural recurrence, neurologic deficit, coma, and death.  Informed consent was obtained.  Surgeon: Vallarie Mare   Assistants: Ashok Pall, MD.  Dr. Christella Noa provided critical assistance with dissection of the subdural membranes off the surface of the brain.  No qualified trainees were available to assist with the procedure.  Anesthesia: General endotracheal anesthesia     Procedure in detail:  The patient was brought to the operating room.  A timeout was performed.  General anesthesia was induced and patient was intubated by the anesthesia service.  After appropriate lines and monitors were placed, patient's head was turned to the right and scalp was shaved, preprepped with alcohol and cleansing solution and prepped and draped in sterile fashion.  1% lidocaine with epinephrine injected into the planned incision.  A question  mark shaped incision was made with a 10 blade and monopolar electrocautery was used to open the galea and incised the temporalis fascia.  The periosteum and temporalis muscle was dissected off the skull and the myocutaneous flap was flapped forward and held in place with fishhooks.  Bur holes were placed at the coronal suture, keyhole, squamosal temporal bone, and parietal bone were used to dissect the dura from the inner table of the skull.  Craniotome was used to perform a craniotomy.  Meticulous epidural hemostasis obtained.  The middle meningeal artery was identified and coagulated to help reduce risk of recurrence.  The dura was then opened in a C-shaped manner and flapped forward.  Thick membrane was encountered immediately.  This membrane was coagulated and opened and combination of gelatinous early subacute subdural hematoma and more liquidy late subacute hematoma was encountered and evacuated with suction.  The membranes were dissected from the dura with coagulation of the small dural feeders all the way to the frontal pole and occipital pole and towards the midline.  Thick membranes were quite adherent to bridging veins and so this was left in place as it was not causing significant mass-effect.  The deep layer membrane was then carefully dissected from the surface of the brain.  There were several areas of adherence that required microscissors for dissection but ultimately there was a very clean dissection plane.  This membrane was dissected off the brain again towards the frontal pole and occipital pole and down to the middle fossa floor and frontal fossa floor.  Remaining membranes were coagulated thoroughly with bipolar.  Subdural space was irrigated thoroughly to ensure no active bleeding and the irrigation returned clear.  The brain had reexpanded somewhat but  was still mildly sunken as expected.  The dura was closed with 4-0 Nurolon in a running fashion with a DuraGen onlay placed on top of that.   The bone flap was replaced with Zimmer plating system.  The wound is irrigated thoroughly with bacitracin impregnated irrigation.  A medium Hemovac drain was placed in subgaleal space and tunneled the skin secured with a stitch.  Temporalis muscle was reapproximated 2-0 Vicryl stitches.  The galea was closed with 2-0 Vicryl sutures in buried fashion.  Skin was closed with staples.  Sterile dressing was then placed on the incision.  Patient was then extubated by the anesthesia service moving all extremities.  All counts were correct at the end of surgery.  No complications were noted.  Findings: Thick loculated membranes, early and late subacute hematoma.  Brain pulsating well at end of surgery.  Estimated Blood Loss:  200 mL         Drains: HEMOVAC         Specimens: subdural membrane         Implants: Zimmer plating        Complications:  * No complications entered in OR log *         Disposition: ICU - intubated and hemodynamically stable.         Condition: stable

## 2019-10-27 ENCOUNTER — Inpatient Hospital Stay (HOSPITAL_COMMUNITY): Payer: Medicare HMO

## 2019-10-27 LAB — GLUCOSE, CAPILLARY
Glucose-Capillary: 185 mg/dL — ABNORMAL HIGH (ref 70–99)
Glucose-Capillary: 210 mg/dL — ABNORMAL HIGH (ref 70–99)
Glucose-Capillary: 208 mg/dL — ABNORMAL HIGH (ref 70–99)
Glucose-Capillary: 168 mg/dL — ABNORMAL HIGH (ref 70–99)
Glucose-Capillary: 180 mg/dL — ABNORMAL HIGH (ref 70–99)
Glucose-Capillary: 191 mg/dL — ABNORMAL HIGH (ref 70–99)

## 2019-10-27 LAB — POCT I-STAT 7, (LYTES, BLD GAS, ICA,H+H)
Acid-base deficit: 4 mmol/L — ABNORMAL HIGH (ref 0.0–2.0)
Bicarbonate: 19.1 mmol/L — ABNORMAL LOW (ref 20.0–28.0)
Calcium, Ion: 1.24 mmol/L (ref 1.15–1.40)
HCT: 25 % — ABNORMAL LOW (ref 39.0–52.0)
Hemoglobin: 8.5 g/dL — ABNORMAL LOW (ref 13.0–17.0)
O2 Saturation: 91 %
Patient temperature: 99.9
Potassium: 4.2 mmol/L (ref 3.5–5.1)
Sodium: 152 mmol/L — ABNORMAL HIGH (ref 135–145)
TCO2: 20 mmol/L — ABNORMAL LOW (ref 22–32)
pCO2 arterial: 29 mmHg — ABNORMAL LOW (ref 32.0–48.0)
pH, Arterial: 7.429 (ref 7.350–7.450)
pO2, Arterial: 61 mmHg — ABNORMAL LOW (ref 83.0–108.0)

## 2019-10-27 LAB — BASIC METABOLIC PANEL
Anion gap: 12 (ref 5–15)
BUN: 58 mg/dL — ABNORMAL HIGH (ref 8–23)
CO2: 17 mmol/L — ABNORMAL LOW (ref 22–32)
Calcium: 8.7 mg/dL — ABNORMAL LOW (ref 8.9–10.3)
Chloride: 119 mmol/L — ABNORMAL HIGH (ref 98–111)
Creatinine, Ser: 1.64 mg/dL — ABNORMAL HIGH (ref 0.61–1.24)
GFR calc Af Amer: 47 mL/min — ABNORMAL LOW (ref >60)
GFR calc non Af Amer: 40 mL/min — ABNORMAL LOW (ref >60)
Glucose, Bld: 229 mg/dL — ABNORMAL HIGH (ref 70–99)
Potassium: 4.5 mmol/L (ref 3.5–5.1)
Sodium: 148 mmol/L — ABNORMAL HIGH (ref 135–145)

## 2019-10-27 LAB — CBC WITH DIFFERENTIAL/PLATELET
Abs Immature Granulocytes: 0.03 10*3/uL (ref 0.00–0.07)
Basophils Absolute: 0 10*3/uL (ref 0.0–0.1)
Basophils Relative: 0 %
Eosinophils Absolute: 0 10*3/uL (ref 0.0–0.5)
Eosinophils Relative: 0 %
HCT: 28 % — ABNORMAL LOW (ref 39.0–52.0)
Hemoglobin: 8.7 g/dL — ABNORMAL LOW (ref 13.0–17.0)
Immature Granulocytes: 0 %
Lymphocytes Relative: 26 %
Lymphs Abs: 2.4 10*3/uL (ref 0.7–4.0)
MCH: 26.6 pg (ref 26.0–34.0)
MCHC: 31.1 g/dL (ref 30.0–36.0)
MCV: 85.6 fL (ref 80.0–100.0)
Monocytes Absolute: 0.6 10*3/uL (ref 0.1–1.0)
Monocytes Relative: 7 %
Neutro Abs: 6.2 10*3/uL (ref 1.7–7.7)
Neutrophils Relative %: 67 %
Platelets: 435 10*3/uL — ABNORMAL HIGH (ref 150–400)
RBC: 3.27 MIL/uL — ABNORMAL LOW (ref 4.22–5.81)
RDW: 15.3 % (ref 11.5–15.5)
WBC: 9.3 10*3/uL (ref 4.0–10.5)
nRBC: 0 % (ref 0.0–0.2)

## 2019-10-27 LAB — URINALYSIS, ROUTINE W REFLEX MICROSCOPIC
Bacteria, UA: NONE SEEN
Bilirubin Urine: NEGATIVE
Glucose, UA: 50 mg/dL — AB
Ketones, ur: 5 mg/dL — AB
Nitrite: NEGATIVE
Protein, ur: NEGATIVE mg/dL
RBC / HPF: >50 RBC/hpf — ABNORMAL HIGH (ref 0–5)
Specific Gravity, Urine: 1.015 (ref 1.005–1.030)
pH: 5 (ref 5.0–8.0)

## 2019-10-27 LAB — URINE CULTURE: Culture: NO GROWTH

## 2019-10-27 IMAGING — CT CT HEAD W/O CM
4 series · 16 of 47 positions shown, 18 images · non-contrast
Comparison: Head CT scan [DATE] and [DATE].

CLINICAL DATA: Weakness. The patient is status post left craniotomy
for evacuation of subdural hematoma [DATE].

EXAM:
CT HEAD WITHOUT CONTRAST
TECHNIQUE: Contiguous axial images were obtained from the base of the skull
through the vertex without intravenous contrast.

[Series 3: head wo · axial · 0.45mm/px · z∈[-147,-17]mm · 7 of 36 slices shown, 9 images]
[im 5/36  brain]
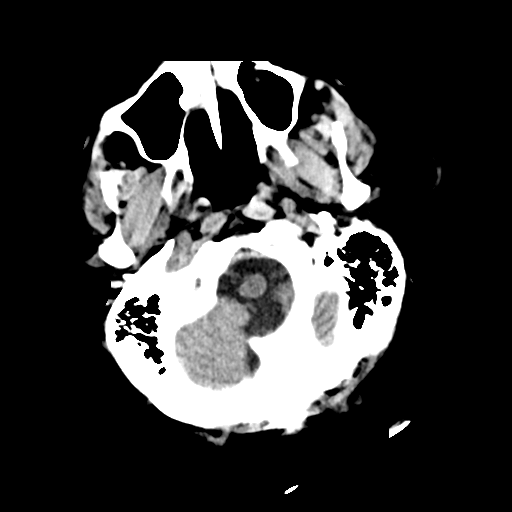
[im 5/36  bone]
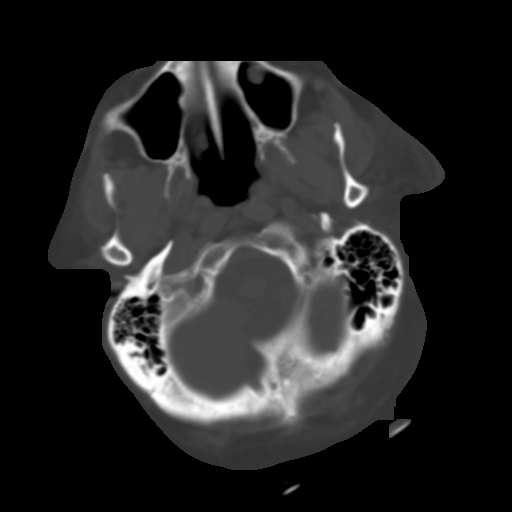
[im 9/36  brain]
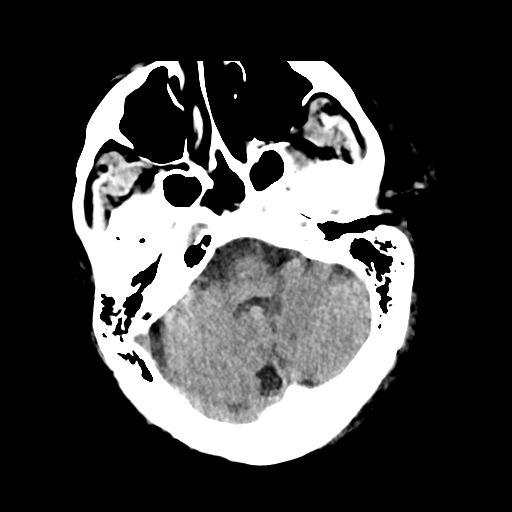
[im 14/36  brain]
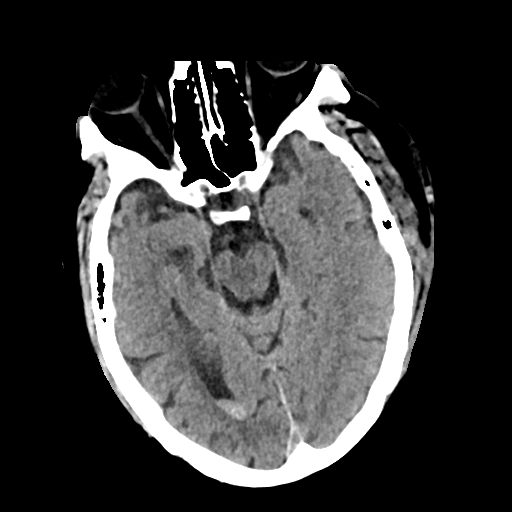
[im 18/36  brain]
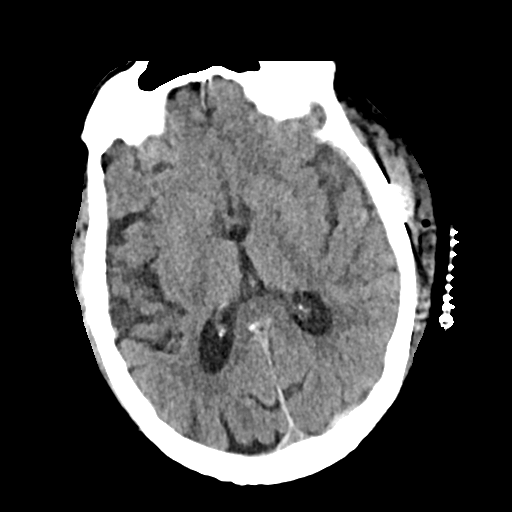
[im 22/36  brain]
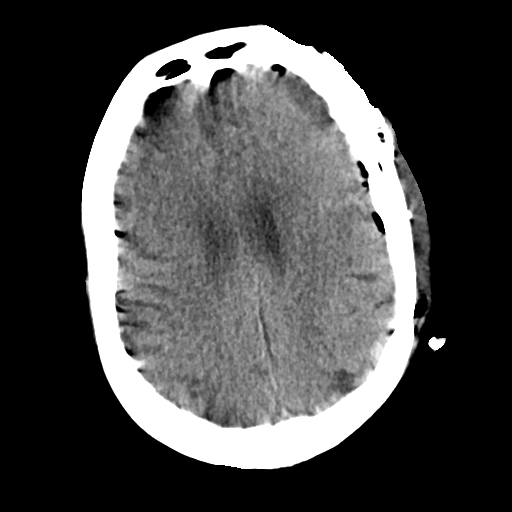
[im 22/36  bone]
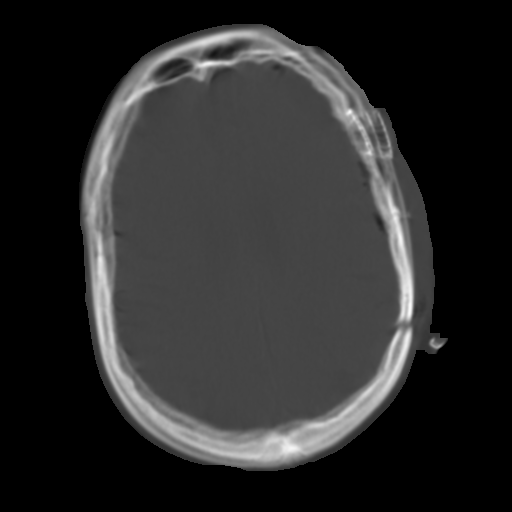
[im 27/36  brain]
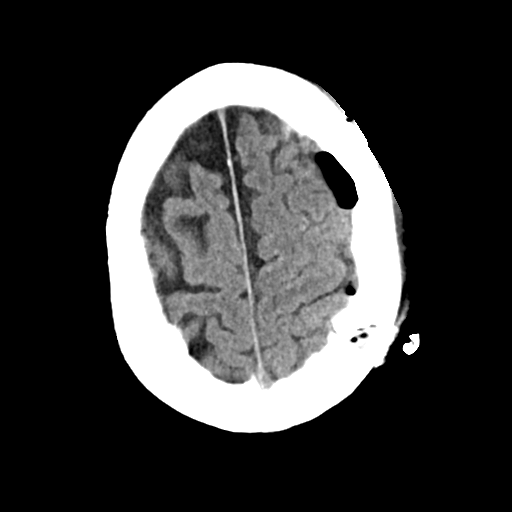
[im 31/36  brain]
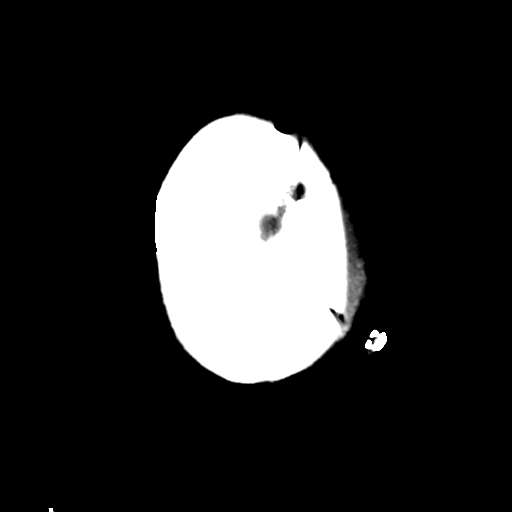

[Series 4: head bone · axial · 0.45mm/px · z∈[-151,-115]mm · 3 of 89 slices shown]
[im 9/89  bone]
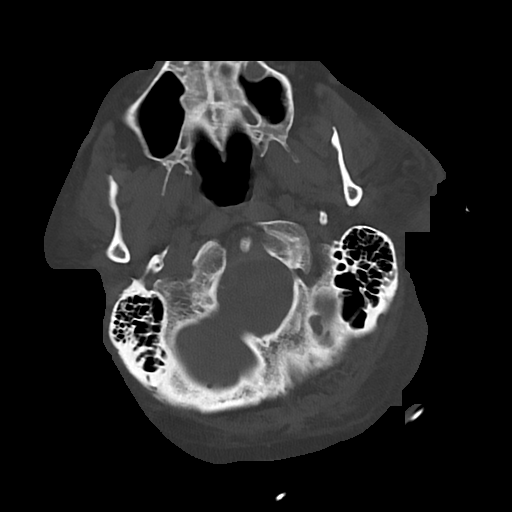
[im 18/89  bone]
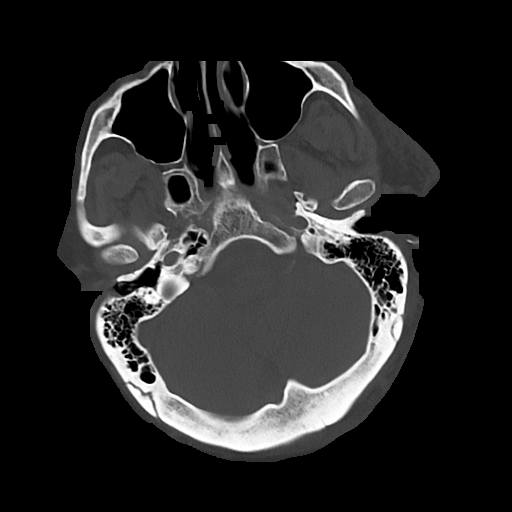
[im 27/89  bone]
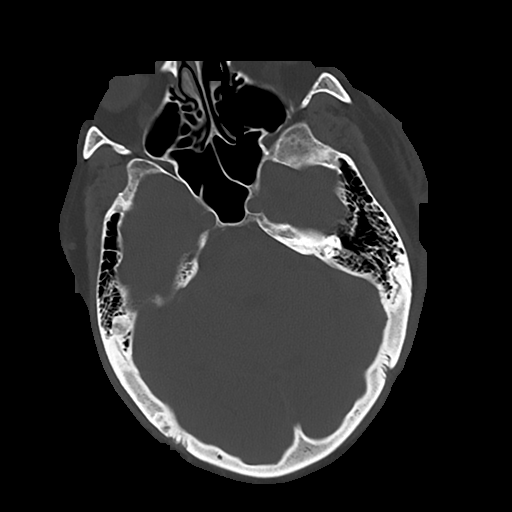

[Series 5: cor soft · coronal · 0.37mm/px · 3 of 81 slices shown]
[im 27/81  brain]
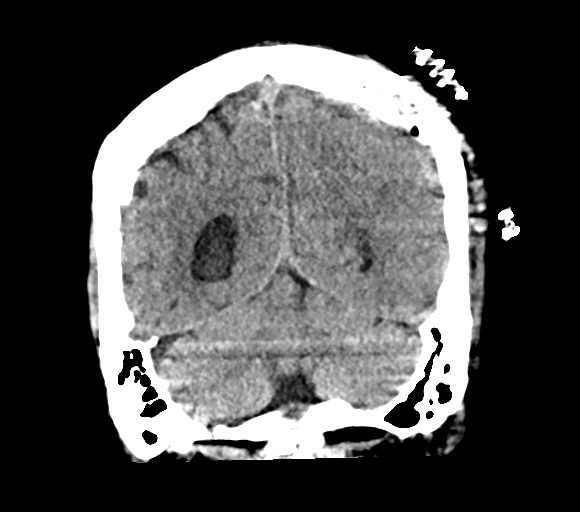
[im 36/81  brain]
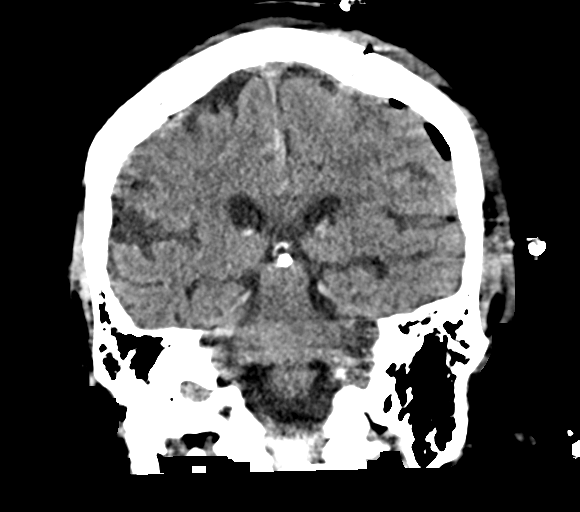
[im 45/81  brain]
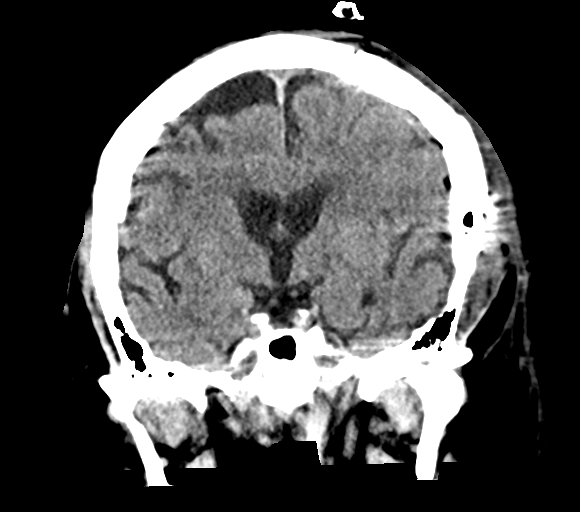

[Series 6: sag soft · sagittal · 0.37mm/px · 3 of 72 slices shown]
[im 24/72  brain]
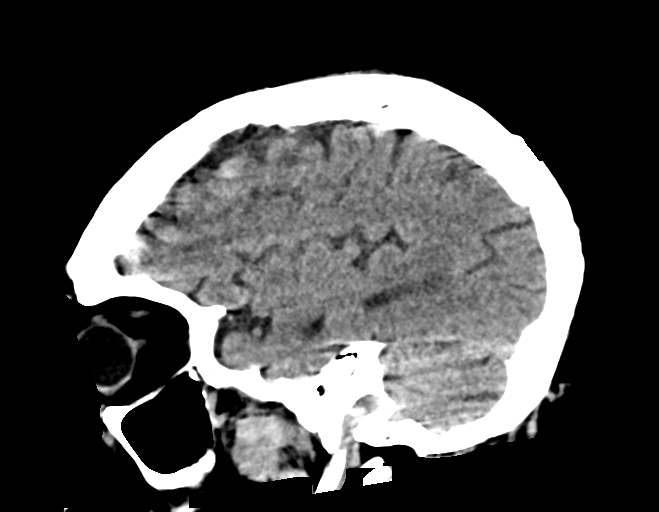
[im 36/72  brain]
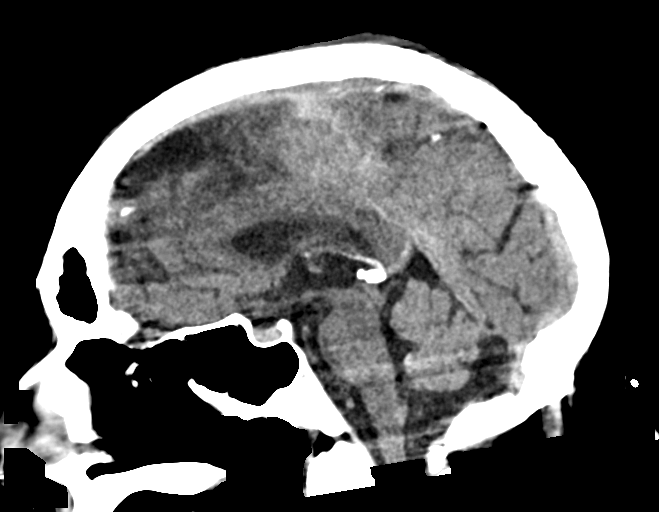
[im 48/72  brain]
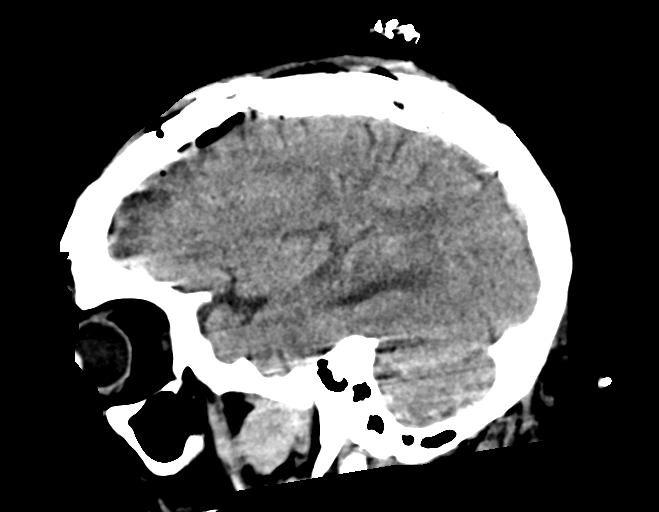

[16 of 47 positions shown; findings below may reference images not displayed]

FINDINGS: Brain: Pneumocephalus on the left from evacuation of the subdural
hematoma is again seen. There is a small amount of residual
hemorrhage present but no midline shift or new hemorrhage. Thickness
of the collection over the left convexities is approximately 0.7 cm
compared to 1.3 cm when measured near the same location as on
yesterday's study. A small amount of hemorrhage is seen layering in
the lateral ventricles, unchanged. Possible trace amount of
subarachnoid hemorrhage in the right frontal lobe is unchanged. No
hydrocephalus.

Vascular: No hyperdense vessel or unexpected calcification.

Skull: Left craniotomy defect again seen.

Sinuses/Orbits: Scalp swelling related to surgery noted.

Other: None.
IMPRESSION: No new abnormality since the prior examination.

Status post left craniotomy for evacuation of a subdural hematoma.
Residual blood and fluid over the left convexities has slightly
decreased. Possible trace amount of subarachnoid hemorrhage in the
right frontal lobe and small volume of hemorrhage in the lateral
ventricles is unchanged.

## 2019-10-27 IMAGING — DX DG CHEST 1V PORT
1 series · 1 of 1 positions shown · non-contrast
Comparison: [DATE]

CLINICAL DATA: Dyspnea.

EXAM:
PORTABLE CHEST 1 VIEW

[chest ap]
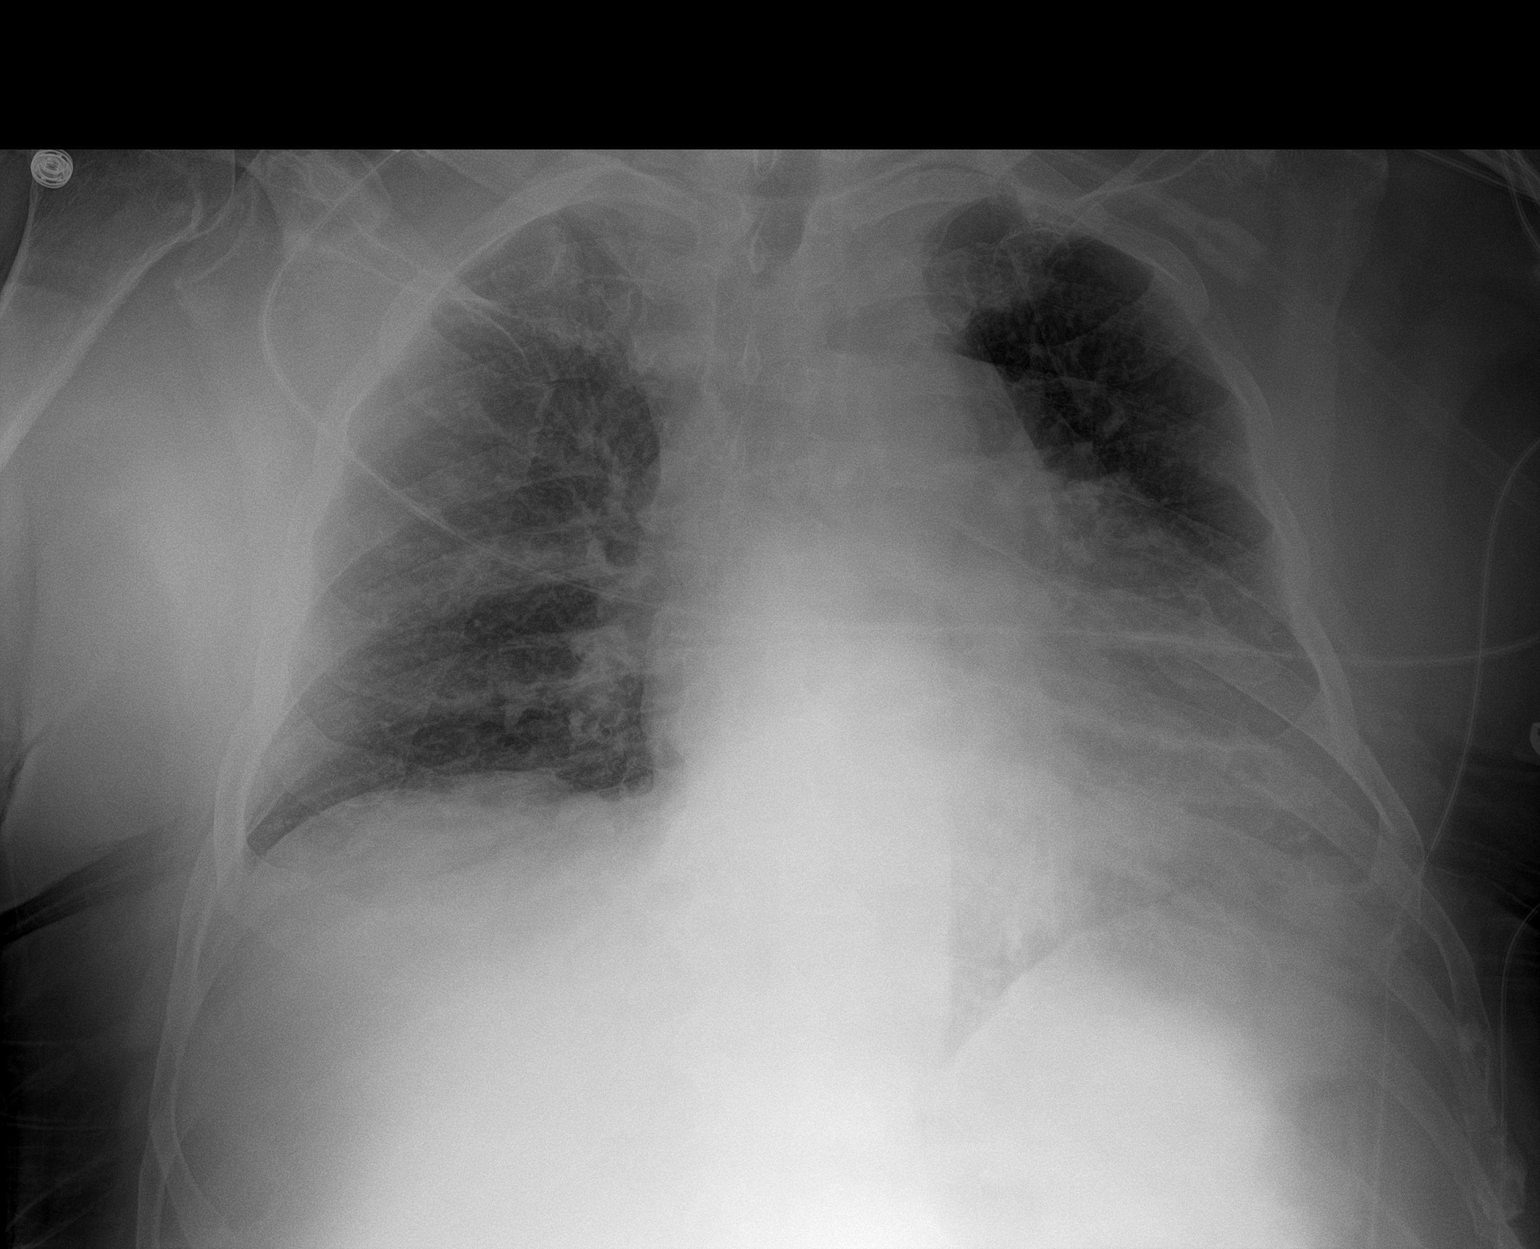

[1 of 1 positions shown; findings below may reference images not displayed]

FINDINGS: The cardiac silhouette remains enlarged. Mild central pulmonary
vascular congestion and interstitial prominence are unchanged. No
sizable pleural effusion or pneumothorax is identified.
IMPRESSION: Unchanged cardiomegaly and pulmonary vascular congestion.

## 2019-10-27 MED ORDER — FUROSEMIDE 10 MG/ML IJ SOLN
INTRAMUSCULAR | Status: AC
  Filled 2019-10-27: qty 4

## 2019-10-27 MED ORDER — CHLORHEXIDINE GLUCONATE CLOTH 2 % EX PADS
6.0000 | MEDICATED_PAD | Freq: Every day | CUTANEOUS | Status: DC
  Administered 2019-10-27 – 2019-11-14 (×19): 6 via TOPICAL

## 2019-10-27 MED ORDER — ORAL CARE MOUTH RINSE
15.0000 mL | Freq: Two times a day (BID) | OROMUCOSAL | Status: DC
  Administered 2019-10-27 – 2019-10-29 (×5): 15 mL via OROMUCOSAL

## 2019-10-27 MED ORDER — CHLORHEXIDINE GLUCONATE 0.12 % MT SOLN
15.0000 mL | Freq: Two times a day (BID) | OROMUCOSAL | Status: DC
  Administered 2019-10-27 – 2019-10-29 (×5): 15 mL via OROMUCOSAL
  Filled 2019-10-27: qty 15

## 2019-10-27 MED ORDER — PANTOPRAZOLE SODIUM 40 MG PO TBEC: 40.0000 mg | DELAYED_RELEASE_TABLET | Freq: Every day | ORAL | Status: DC

## 2019-10-27 MED ORDER — FUROSEMIDE 10 MG/ML IJ SOLN
40.0000 mg | Freq: Once | INTRAMUSCULAR | Status: AC
  Administered 2019-10-27: 14:00:00 40 mg via INTRAVENOUS

## 2019-10-27 NOTE — Progress Notes (Signed)
OT Cancellation Note  Patient Details Name: Tommy Green MRN: [id number] DOB: 1946-02-14   Cancelled Treatment:    Reason Eval/Treat Not Completed: Medical issues which prohibited therapy. Patient supine in bed with increased WOB, RR this morning.  RN reports consulted CCM.  OT will hold at this time.  Will follow and see as able/apporpriate.  Jolaine Artist, OT Acute Rehabilitation Services Pager [phone] Office [phone]   Delight Stare 10/27/2019, 12:07 PM

## 2019-10-27 NOTE — Progress Notes (Signed)
NAME:  Tommy Green, MRN:  [id number], DOB:  03/22/1946, LOS: 3 ADMISSION DATE:  10/24/2019, CONSULTATION DATE:  10/25/19 REFERRING MD:  Marcello Moores  CHIEF COMPLAINT:  Vent management   Brief History   Tommy Green is a 75 y.o. male who was taken to OR 1/7 for craniotomy and evacuation of SDH.  He was extubated in PACU prior to transfer to ICU.  History of present illness   Pt is encephelopathic; therefore, this HPI is obtained from chart review. Tommy Green is a 74 y.o. male who has a PMH as outlined below.  He suffered blunt head trauma a few weeks back and had no concerning findings on imaging.  Subsequently developed confusion, aphasia, mobility problems to the point he could no longer perform any ADLs.  He presented to [facility] ED 1/6 and CT head showed loculated AoC left SDH with mass effect.  He was transferred to [facility] for neurosurgery evaluation.  On 1/7, he was taken to OR for craniotomy and evacuation of SDH.  PCCM consulted for vent management post op; however, pt was extubated in PACU prior to transfer to ICU.  Pt was seen 10/26/2019 and Dr. Elsworth Soho signed off. I was paged to the bedside 1/9 at 1:48 pm for increasing respiratory distress and altered mental status.  Pt was added back to the PCCM list  Past Medical History  has Pain in joint, ankle and foot; Essential hypertension; Hyperlipidemia associated with type 2 diabetes mellitus ([place]); Type 2 diabetes mellitus with diabetic neuropathy, unspecified ([place]); Heart murmur; Hypertriglyceridemia; History of stroke; Skin lesion; Squamous cell carcinoma in situ (SCCIS) of skin; Granuloma annulare; Tendinopathy of right gluteus medius; Tendinopathy of left gluteus medius; Neoplasm of uncertain behavior; Bilateral hip pain; AK (actinic keratosis); Neuropathy; Cyclic vomiting syndrome; Chronic pain of right knee; and Subdural hematoma (HCC) on their problem list.  Significant Hospital Events   1/7 > admit.  Consults:   PCCM.  Procedures:  ETT 1/7 > 17  Significant Diagnostic Tests:  CT head 1/6 > large AoC L SDH with mass effect and 5.44mm L to R MLS. CT head 1/8 >    Micro Data:  SARS CoV 2 1/6 > neg.  Antimicrobials:  None.   Interim history/subjective:  Afebrile In four-point restraints  Objective:  Blood pressure 134/85, pulse 97, temperature 99.9 F (37.7 C), temperature source Axillary, resp. rate (!) 31, height 5\' 4"  (1.626 m), weight 118.4 kg, SpO2 98 %.        Intake/Output Summary (Last 24 hours) at 10/27/2019 1542 Last data filed at 10/27/2019 1500 Gross per 24 hour  Intake 1917.8 ml  Output 2225 ml  Net -307.2 ml   Filed Weights   10/25/19 1256  Weight: 118.4 kg    Examination: General: Adult male, grunting respirations, AMS, sats are adequate. Neuro: Sleepy, difficult to arouse, non-verbal 1/9, follows no  commands HEENT: [place]/AT. Sclerae anicteric.  Dry mucosa Cardiovascular: RRR, no M/R/G.  Lungs: Respirations even and unlabored.  CTA bilaterally, No W/R/R.  Abdomen: BS x 4, soft, NT/ND.  Musculoskeletal: No gross deformities, no edema.  Skin: Intact, warm, no rashes.  Assessment & Plan:   L AoC SDH - s/p evacuation 1/7. - Post op care per neurosurgery.  Respiratory Distress  Grunting respirations New AMS CXR with cardiomegaly and vascular congestion Sats 100 % on HFNC at 6 L RR 33-35  Hyperventilating>>  Plan Lasix 40 mg now ABG now>> 7.42/29.1/ 61/19.3 If normal consider  head CT to evaluate  for neuro cause   AKI.-Baseline creatinine 1.4-1.6 range Mild AG + NAGMA. Good UO Hyperkalemia resolved - Continue gentle hydration. - Do not restart lisinopril - Trend BMET - Monitor UO    Anemia. - Transfuse for Hgb < 7. - Trend CBC  Hx DM. - SSI. - D/c lantus while NPO. - Hold home metformin, tresiba.  Hx HTN, HLD. - Nicardipine PRN  per neurosurgery. - Hold home carvedilol, HCTZ, lisinopril, fenofibrate, lovastatin - can resume when able to  take PO safely.   PCCM will continue to follow Considering CT Head after discussion with Neuro surgery Goals of care conversations need to be initiated with family   Best Practice:  Diet: NPO. Pain/Anxiety/Delirium protocol (if indicated): N/A. VAP protocol (if indicated): N/A. DVT prophylaxis: SCD's. GI prophylaxis: N/A. Glucose control: SSI. Mobility: Bedrest. Code Status: Full. Family Communication: Per primary. Disposition: ICU.  Magdalen Spatz, MSN, AGACNP-BC [place] Pager # [phone] After 4 pm please call [phone]  10/27/2019, 3:42 PM

## 2019-10-27 NOTE — Progress Notes (Signed)
SLP Cancellation Note  Patient Details Name: Tommy Green MRN: [id number] DOB: April 09, 1946   Cancelled treatment:       Reason Eval/Treat Not Completed: Medical issues which prohibited therapy. Pt has increased WOB, RR this morning. Per RN he is also not following commands. Given presentation on previous date during SLP evaluation and now decreasing respiratory status, will hold PO trials this morning. Will continue to follow for PO readiness.    Osie Bond., M.A. [place] [facility] Pager [phone] Office [phone]  10/27/2019, 8:43 AM

## 2019-10-27 NOTE — Progress Notes (Addendum)
  NEUROSURGERY PROGRESS NOTE   No issues overnight, has remained somewhat agitated.  EXAM:  BP 135/71   Pulse 96   Temp (!) 100.7 F (38.2 C) (Axillary) Comment: tylenol given  Resp (!) 28   Ht 5\' 4"  (1.626 m)   Wt 118.4 kg   SpO2 100%   BMI 44.80 kg/m   No eye opening Occasionally makes incomprehensible sounds Purposeful movements BUE/BLE but not FC this am Wound c/d/i  IMPRESSION:  74 y.o. male POD#2 s/p left crani for SDH, obtundation of unclear etiology but non-focal exam. ?hx of significant EtOH use. CT yesterday reassuring and CBC and BMP relatively normal with Cr improved to 1.6. EEG (-) for SZ.  PLAN: - Cont supportive care and obs in ICU

## 2019-10-27 NOTE — Progress Notes (Signed)
Pt has remained very obtunded throughout the day. I spoke with Dr. Lynetta Mare and have reviewed the CT head which does not demonstrate any recurrence of SDH, no MLS, no HCP. Metabolic derangements have largely been corrected. EEG yesterday was negative for SZ. Will cont to monitor.

## 2019-10-28 ENCOUNTER — Inpatient Hospital Stay (HOSPITAL_COMMUNITY): Payer: Medicare HMO

## 2019-10-28 LAB — GLUCOSE, CAPILLARY
Glucose-Capillary: 231 mg/dL — ABNORMAL HIGH (ref 70–99)
Glucose-Capillary: 220 mg/dL — ABNORMAL HIGH (ref 70–99)
Glucose-Capillary: 216 mg/dL — ABNORMAL HIGH (ref 70–99)
Glucose-Capillary: 160 mg/dL — ABNORMAL HIGH (ref 70–99)
Glucose-Capillary: 145 mg/dL — ABNORMAL HIGH (ref 70–99)
Glucose-Capillary: 183 mg/dL — ABNORMAL HIGH (ref 70–99)

## 2019-10-28 LAB — CBC
HCT: 28.2 % — ABNORMAL LOW (ref 39.0–52.0)
Hemoglobin: 8.7 g/dL — ABNORMAL LOW (ref 13.0–17.0)
MCH: 26.4 pg (ref 26.0–34.0)
MCHC: 30.9 g/dL (ref 30.0–36.0)
MCV: 85.7 fL (ref 80.0–100.0)
Platelets: 420 10*3/uL — ABNORMAL HIGH (ref 150–400)
RBC: 3.29 MIL/uL — ABNORMAL LOW (ref 4.22–5.81)
RDW: 15.6 % — ABNORMAL HIGH (ref 11.5–15.5)
WBC: 9.1 10*3/uL (ref 4.0–10.5)
nRBC: 0 % (ref 0.0–0.2)

## 2019-10-28 LAB — BASIC METABOLIC PANEL
Anion gap: 11 (ref 5–15)
BUN: 48 mg/dL — ABNORMAL HIGH (ref 8–23)
CO2: 21 mmol/L — ABNORMAL LOW (ref 22–32)
Calcium: 8.9 mg/dL (ref 8.9–10.3)
Chloride: 122 mmol/L — ABNORMAL HIGH (ref 98–111)
Creatinine, Ser: 1.42 mg/dL — ABNORMAL HIGH (ref 0.61–1.24)
GFR calc Af Amer: 56 mL/min — ABNORMAL LOW (ref >60)
GFR calc non Af Amer: 48 mL/min — ABNORMAL LOW (ref >60)
Glucose, Bld: 242 mg/dL — ABNORMAL HIGH (ref 70–99)
Potassium: 4.3 mmol/L (ref 3.5–5.1)
Sodium: 154 mmol/L — ABNORMAL HIGH (ref 135–145)

## 2019-10-28 LAB — PROCALCITONIN: Procalcitonin: 0.28 ng/mL

## 2019-10-28 LAB — MAGNESIUM: Magnesium: 2.2 mg/dL (ref 1.7–2.4)

## 2019-10-28 IMAGING — DX DG CHEST 1V PORT
1 series · 1 of 1 positions shown · non-contrast
Comparison: [DATE]

CLINICAL DATA: Respiratory failure.

EXAM:
PORTABLE CHEST 1 VIEW

[chest ap]
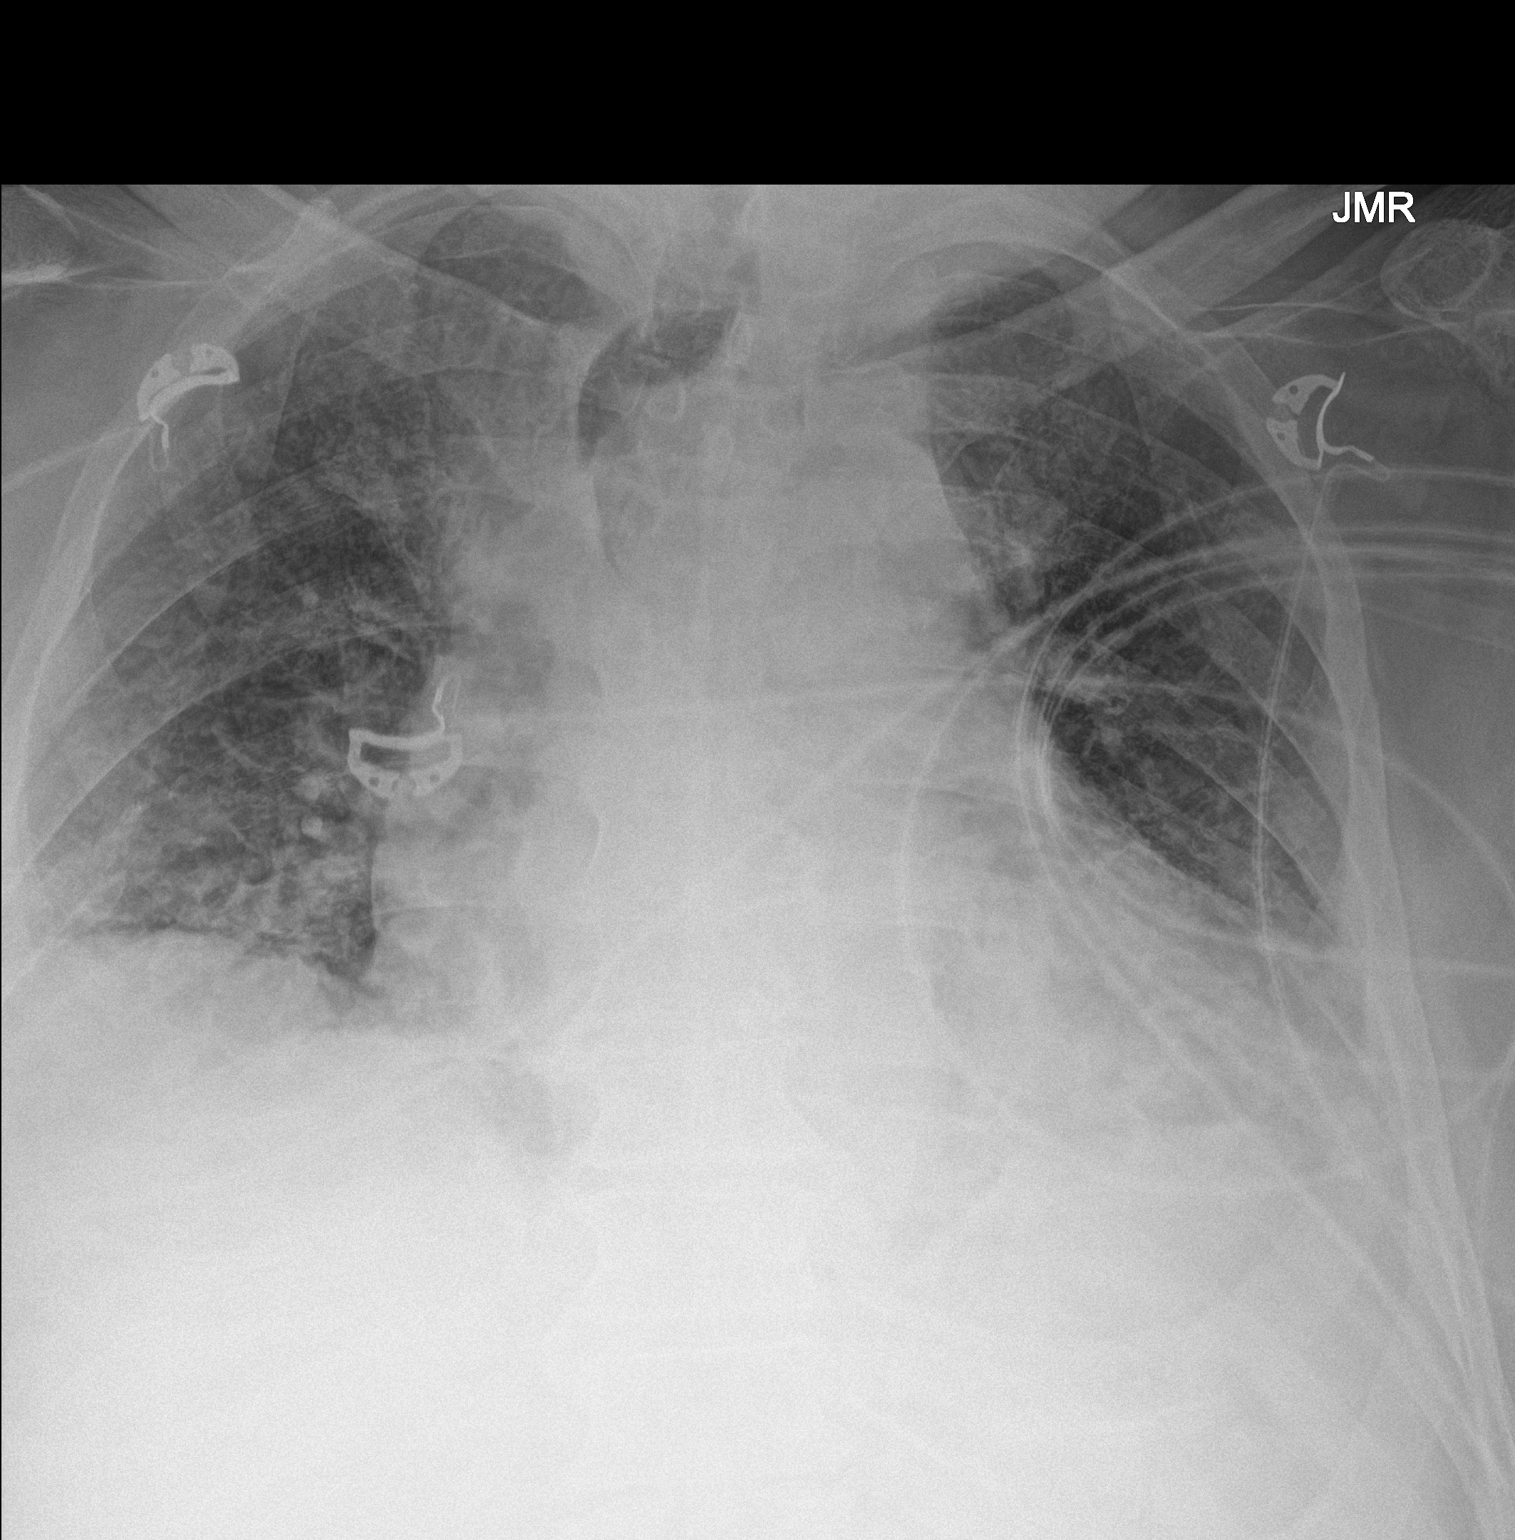

[1 of 1 positions shown; findings below may reference images not displayed]

FINDINGS: The patient is slightly rotated to the left with grossly unchanged
enlargement of the cardiac silhouette. There is persistent pulmonary
vascular congestion with increased interstitial densities
bilaterally. Additional mild bibasilar opacities likely reflect
atelectasis. The right lateral costophrenic angle was incompletely
imaged. No large pleural effusion or pneumothorax is identified.
IMPRESSION: Cardiomegaly with increased densities likely reflecting edema.

## 2019-10-28 MED ORDER — LORAZEPAM 2 MG/ML IJ SOLN
2.0000 mg | INTRAMUSCULAR | Status: DC | PRN
  Administered 2019-10-29 (×2): 2 mg via INTRAVENOUS
  Filled 2019-10-28 (×2): qty 1

## 2019-10-28 MED ORDER — PANTOPRAZOLE SODIUM 40 MG IV SOLR
40.0000 mg | INTRAVENOUS | Status: DC
  Administered 2019-10-28 – 2019-11-09 (×13): 40 mg via INTRAVENOUS
  Filled 2019-10-28 (×13): qty 40

## 2019-10-28 NOTE — Progress Notes (Signed)
SLP Cancellation Note  Patient Details Name: Tommy Green MRN: [id number] DOB: 23-May-1946   Cancelled treatment:        Attempted to see pt for reassessment of swallow function.  Pt remains obtunded with increased work of breathing and is not appropriate for PO trials at this time.  SLP will continue to follow.   Reniya Mcclees E Roy Snuffer 10/28/2019, 12:08 PM

## 2019-10-28 NOTE — Progress Notes (Signed)
NAME:  Tommy Green, MRN:  [id number], DOB:  1946-03-05, LOS: 4 ADMISSION DATE:  10/24/2019, CONSULTATION DATE:  10/25/19 REFERRING MD:  Marcello Moores  CHIEF COMPLAINT:  Vent management   Brief History   Tommy Green is a 74 y.o. male who was taken to OR 1/7 for craniotomy and evacuation of SDH.  He was extubated in PACU prior to transfer to ICU.  History of present illness    Tommy Green is a 74 y.o. male who has a PMH as outlined below.  He suffered blunt head trauma a few weeks back and had no concerning findings on imaging.  Subsequently developed confusion, aphasia, mobility problems to the point he could no longer perform any ADLs.  He presented to [facility] ED 1/6 and CT head showed loculated AoC left SDH with mass effect.  He was transferred to [facility] for neurosurgery evaluation.  On 1/7, he was taken to OR for craniotomy and evacuation of SDH.  PCCM consulted for vent management post op; however, pt was extubated in PACU prior to transfer to ICU.  Pt was seen 10/26/2019 and Dr. Elsworth Soho signed off. I was paged to the bedside 1/9 at 1:48 pm for increasing respiratory distress and altered mental status.  Pt was added back to the PCCM list  Past Medical History  has Pain in joint, ankle and foot; Essential hypertension; Hyperlipidemia associated with type 2 diabetes mellitus ([place]); Type 2 diabetes mellitus with diabetic neuropathy, unspecified ([place]); Heart murmur; Hypertriglyceridemia; History of stroke; Skin lesion; Squamous cell carcinoma in situ (SCCIS) of skin; Granuloma annulare; Tendinopathy of right gluteus medius; Tendinopathy of left gluteus medius; Neoplasm of uncertain behavior; Bilateral hip pain; AK (actinic keratosis); Neuropathy; Cyclic vomiting syndrome; Chronic pain of right knee; and Subdural hematoma (HCC) on their problem list.  Significant Hospital Events   1/7 > admit.  Consults:    Procedures:  ETT 1/7 > 1/7  Significant Diagnostic Tests:  CT head 1/6 > large AoC L  SDH with mass effect and 5.26mm L to R MLS. CT head 1/9 > no new abnormalities  Micro Data:  SARS CoV 2 1/6 > neg.  Antimicrobials:    Interim history/subjective:  Tm 101F, better now.    Objective:  Blood pressure 129/84, pulse (!) 101, temperature 100 F (37.8 C), temperature source Axillary, resp. rate (!) 24, height 5\' 4"  (1.626 m), weight 118.4 kg, SpO2 97 %.        Intake/Output Summary (Last 24 hours) at 10/28/2019 0734 Last data filed at 10/28/2019 0600 Gross per 24 hour  Intake 1856.49 ml  Output 3450 ml  Net -1593.51 ml   Filed Weights   10/25/19 1256  Weight: 118.4 kg    Examination:  General - obtunded Eyes - pupils reactive ENT - snoring, no stridor Cardiac - regular rate/rhythm, no murmur Chest - scattered rhonchi Abdomen - soft, non tender, + bowel sounds Extremities - no cyanosis, clubbing, or edema Skin - no rashes Neuro - not following commands     Assessment & Plan:   Acute hypoxic respiratory failure with concern for compromised. - likely also has sleep apnea - oxygen to keep SpO2 > 92% - would benefit from CPAP if mental status improved - try to sit up as tolerated (positional therapy for sleep apnea)  Fever. - check procalcitonin - prn tylenol  SDH s/p evacuation. - per neurosurgery  CKD 2. Hypernatremia. - f/u BMET  Dysphagia. - might need NG tube for nutrition and meds  Anemia of critical illness. - f/u CBC - transfuse for Hb < 7  DM type II. - SSI - Hold home metformin, tresiba.  Hx HTN, HLD. - goal SBP < 160 - Hold home carvedilol, HCTZ, lisinopril, fenofibrate, lovastatin - can resume when able to take PO safely.   Best Practice:  Diet: NPO. DVT prophylaxis: SCD's. GI prophylaxis: N/A. Mobility: Bedrest. Code Status: Full. Disposition: ICU.  Labs:   CMP Latest Ref Rng & Units 10/27/2019 10/27/2019 10/26/2019  Glucose 70 - 99 mg/dL - 229(H) 237(H)  BUN 8 - 23 mg/dL - 58(H) 71(H)  Creatinine 0.61 - 1.24  mg/dL - 1.64(H) 2.23(H)  Sodium 135 - 145 mmol/L 152(H) 148(H) 141  Potassium 3.5 - 5.1 mmol/L 4.2 4.5 4.8  Chloride 98 - 111 mmol/L - 119(H) 112(H)  CO2 22 - 32 mmol/L - 17(L) 16(L)  Calcium 8.9 - 10.3 mg/dL - 8.7(L) 8.3(L)  Total Protein 6.5 - 8.1 g/dL - - -  Total Bilirubin 0.3 - 1.2 mg/dL - - -  Alkaline Phos 38 - 126 U/L - - -  AST 15 - 41 U/L - - -  ALT 0 - 44 U/L - - -    CBC Latest Ref Rng & Units 10/27/2019 10/27/2019 10/26/2019  WBC 4.0 - 10.5 K/uL - 9.3 13.4(H)  Hemoglobin 13.0 - 17.0 g/dL 8.5(L) 8.7(L) 8.0(L)  Hematocrit 39.0 - 52.0 % 25.0(L) 28.0(L) 25.5(L)  Platelets 150 - 400 K/uL - 435(H) 355    ABG    Component Value Date/Time   PHART 7.429 10/27/2019 1556   PCO2ART 29.0 (L) 10/27/2019 1556   PO2ART 61.0 (L) 10/27/2019 1556   HCO3 19.1 (L) 10/27/2019 1556   TCO2 20 (L) 10/27/2019 1556   ACIDBASEDEF 4.0 (H) 10/27/2019 1556   O2SAT 91.0 10/27/2019 1556    CBG (last 3)  Recent Labs    10/27/19 1943 10/27/19 2329 10/28/19 0340  GLUCAP 180* 191* 231*    CC time 32 minutes  Chesley Mires, MD [place] 10/28/2019, 7:44 AM

## 2019-10-28 NOTE — Progress Notes (Signed)
  NEUROSURGERY PROGRESS NOTE   Pt seen and examined. No issues overnight.   EXAM: Temp:  [99.3 F (37.4 C)-101 F (38.3 C)] 99.3 F (37.4 C) (01/10 0800) Pulse Rate:  [25-107] 101 (01/10 0700) Resp:  [20-35] 24 (01/10 0700) BP: (110-173)/(58-111) 129/84 (01/10 0700) SpO2:  [93 %-100 %] 97 % (01/10 0700) Intake/Output      01/09 0701 - 01/10 0700 01/10 0701 - 01/11 0700   P.O. 11    I.V. (mL/kg) 1645.5 (13.9)    IV Piggyback 200    Total Intake(mL/kg) 1856.5 (15.7)    Urine (mL/kg/hr) 3450 (1.2)    Drains     Total Output 3450    Net -1593.5         Urine Occurrence 3 x     No eye opening to pain Grimaces Not following commands, purposeful with BUE Wound c/d/i  LABS: Lab Results  Component Value Date   CREATININE 1.64 (H) 10/27/2019   BUN 58 (H) 10/27/2019   NA 152 (H) 10/27/2019   K 4.2 10/27/2019   CL 119 (H) 10/27/2019   CO2 17 (L) 10/27/2019   Lab Results  Component Value Date   WBC 9.1 10/28/2019   HGB 8.7 (L) 10/28/2019   HCT 28.2 (L) 10/28/2019   MCV 85.7 10/28/2019   PLT 420 (H) 10/28/2019    IMAGING: CT yesterday unremarkable without recurrence of SDH.  IMPRESSION: - 74 y.o. male POD# 3 s/p left crani for SDH. Remains obtunded although CT is reassuring, EEG negative, low-grade fever but normal WBC and urine negative.  PLAN: - Cont observation

## 2019-10-29 ENCOUNTER — Inpatient Hospital Stay (HOSPITAL_COMMUNITY): Payer: Medicare HMO

## 2019-10-29 DIAGNOSIS — J9690 Respiratory failure, unspecified, unspecified whether with hypoxia or hypercapnia: Secondary | ICD-10-CM

## 2019-10-29 DIAGNOSIS — R0600 Dyspnea, unspecified: Secondary | ICD-10-CM

## 2019-10-29 DIAGNOSIS — E870 Hyperosmolality and hypernatremia: Secondary | ICD-10-CM

## 2019-10-29 DIAGNOSIS — G9340 Encephalopathy, unspecified: Secondary | ICD-10-CM

## 2019-10-29 DIAGNOSIS — J9601 Acute respiratory failure with hypoxia: Secondary | ICD-10-CM

## 2019-10-29 LAB — MAGNESIUM
Magnesium: 2.1 mg/dL (ref 1.7–2.4)
Magnesium: 2.2 mg/dL (ref 1.7–2.4)

## 2019-10-29 LAB — BASIC METABOLIC PANEL
Anion gap: 10 (ref 5–15)
Anion gap: 10 (ref 5–15)
Anion gap: 9 (ref 5–15)
Anion gap: 10 (ref 5–15)
Anion gap: 7 (ref 5–15)
BUN: 44 mg/dL — ABNORMAL HIGH (ref 8–23)
BUN: 43 mg/dL — ABNORMAL HIGH (ref 8–23)
BUN: 43 mg/dL — ABNORMAL HIGH (ref 8–23)
BUN: 44 mg/dL — ABNORMAL HIGH (ref 8–23)
BUN: 45 mg/dL — ABNORMAL HIGH (ref 8–23)
CO2: 21 mmol/L — ABNORMAL LOW (ref 22–32)
CO2: 21 mmol/L — ABNORMAL LOW (ref 22–32)
CO2: 21 mmol/L — ABNORMAL LOW (ref 22–32)
CO2: 21 mmol/L — ABNORMAL LOW (ref 22–32)
CO2: 22 mmol/L (ref 22–32)
Calcium: 9 mg/dL (ref 8.9–10.3)
Calcium: 8.9 mg/dL (ref 8.9–10.3)
Calcium: 8.9 mg/dL (ref 8.9–10.3)
Calcium: 8.7 mg/dL — ABNORMAL LOW (ref 8.9–10.3)
Calcium: 8.9 mg/dL (ref 8.9–10.3)
Chloride: 128 mmol/L — ABNORMAL HIGH (ref 98–111)
Chloride: 128 mmol/L — ABNORMAL HIGH (ref 98–111)
Chloride: 126 mmol/L — ABNORMAL HIGH (ref 98–111)
Chloride: 125 mmol/L — ABNORMAL HIGH (ref 98–111)
Chloride: 128 mmol/L — ABNORMAL HIGH (ref 98–111)
Creatinine, Ser: 1.38 mg/dL — ABNORMAL HIGH (ref 0.61–1.24)
Creatinine, Ser: 1.39 mg/dL — ABNORMAL HIGH (ref 0.61–1.24)
Creatinine, Ser: 1.35 mg/dL — ABNORMAL HIGH (ref 0.61–1.24)
Creatinine, Ser: 1.42 mg/dL — ABNORMAL HIGH (ref 0.61–1.24)
Creatinine, Ser: 1.5 mg/dL — ABNORMAL HIGH (ref 0.61–1.24)
GFR calc Af Amer: 58 mL/min — ABNORMAL LOW (ref >60)
GFR calc Af Amer: 57 mL/min — ABNORMAL LOW (ref >60)
GFR calc Af Amer: 59 mL/min — ABNORMAL LOW (ref >60)
GFR calc Af Amer: 56 mL/min — ABNORMAL LOW (ref >60)
GFR calc Af Amer: 52 mL/min — ABNORMAL LOW (ref >60)
GFR calc non Af Amer: 50 mL/min — ABNORMAL LOW (ref >60)
GFR calc non Af Amer: 49 mL/min — ABNORMAL LOW (ref >60)
GFR calc non Af Amer: 51 mL/min — ABNORMAL LOW (ref >60)
GFR calc non Af Amer: 48 mL/min — ABNORMAL LOW (ref >60)
GFR calc non Af Amer: 45 mL/min — ABNORMAL LOW (ref >60)
Glucose, Bld: 229 mg/dL — ABNORMAL HIGH (ref 70–99)
Glucose, Bld: 236 mg/dL — ABNORMAL HIGH (ref 70–99)
Glucose, Bld: 304 mg/dL — ABNORMAL HIGH (ref 70–99)
Glucose, Bld: 279 mg/dL — ABNORMAL HIGH (ref 70–99)
Glucose, Bld: 218 mg/dL — ABNORMAL HIGH (ref 70–99)
Potassium: 4.2 mmol/L (ref 3.5–5.1)
Potassium: 4.2 mmol/L (ref 3.5–5.1)
Potassium: 4.3 mmol/L (ref 3.5–5.1)
Potassium: 4.4 mmol/L (ref 3.5–5.1)
Potassium: 4.2 mmol/L (ref 3.5–5.1)
Sodium: 159 mmol/L — ABNORMAL HIGH (ref 135–145)
Sodium: 159 mmol/L — ABNORMAL HIGH (ref 135–145)
Sodium: 156 mmol/L — ABNORMAL HIGH (ref 135–145)
Sodium: 156 mmol/L — ABNORMAL HIGH (ref 135–145)
Sodium: 157 mmol/L — ABNORMAL HIGH (ref 135–145)

## 2019-10-29 LAB — POCT I-STAT 7, (LYTES, BLD GAS, ICA,H+H)
Acid-base deficit: 2 mmol/L (ref 0.0–2.0)
Bicarbonate: 23.2 mmol/L (ref 20.0–28.0)
Calcium, Ion: 1.31 mmol/L (ref 1.15–1.40)
HCT: 26 % — ABNORMAL LOW (ref 39.0–52.0)
Hemoglobin: 8.8 g/dL — ABNORMAL LOW (ref 13.0–17.0)
O2 Saturation: 100 %
Patient temperature: 100.6
Potassium: 4.2 mmol/L (ref 3.5–5.1)
Sodium: 159 mmol/L — ABNORMAL HIGH (ref 135–145)
TCO2: 25 mmol/L (ref 22–32)
pCO2 arterial: 45.4 mmHg (ref 32.0–48.0)
pH, Arterial: 7.322 — ABNORMAL LOW (ref 7.350–7.450)
pO2, Arterial: 246 mmHg — ABNORMAL HIGH (ref 83.0–108.0)

## 2019-10-29 LAB — CBC
HCT: 28.9 % — ABNORMAL LOW (ref 39.0–52.0)
Hemoglobin: 8.8 g/dL — ABNORMAL LOW (ref 13.0–17.0)
MCH: 26.6 pg (ref 26.0–34.0)
MCHC: 30.4 g/dL (ref 30.0–36.0)
MCV: 87.3 fL (ref 80.0–100.0)
Platelets: 409 10*3/uL — ABNORMAL HIGH (ref 150–400)
RBC: 3.31 MIL/uL — ABNORMAL LOW (ref 4.22–5.81)
RDW: 15.5 % (ref 11.5–15.5)
WBC: 9.7 10*3/uL (ref 4.0–10.5)
nRBC: 0 % (ref 0.0–0.2)

## 2019-10-29 LAB — GLUCOSE, CAPILLARY
Glucose-Capillary: 160 mg/dL — ABNORMAL HIGH (ref 70–99)
Glucose-Capillary: 196 mg/dL — ABNORMAL HIGH (ref 70–99)
Glucose-Capillary: 198 mg/dL — ABNORMAL HIGH (ref 70–99)
Glucose-Capillary: 229 mg/dL — ABNORMAL HIGH (ref 70–99)
Glucose-Capillary: 232 mg/dL — ABNORMAL HIGH (ref 70–99)
Glucose-Capillary: 240 mg/dL — ABNORMAL HIGH (ref 70–99)
Glucose-Capillary: 295 mg/dL — ABNORMAL HIGH (ref 70–99)

## 2019-10-29 LAB — PHOSPHORUS
Phosphorus: 2.9 mg/dL (ref 2.5–4.6)
Phosphorus: 2.8 mg/dL (ref 2.5–4.6)

## 2019-10-29 LAB — SODIUM, URINE, RANDOM: Sodium, Ur: 80 mmol/L

## 2019-10-29 LAB — SURGICAL PATHOLOGY

## 2019-10-29 LAB — AMMONIA: Ammonia: 36 umol/L — ABNORMAL HIGH (ref 9–35)

## 2019-10-29 LAB — OSMOLALITY, URINE: Osmolality, Ur: 610 mOsm/kg (ref 300–900)

## 2019-10-29 LAB — OSMOLALITY: Osmolality: 348 mOsm/kg (ref 275–295)

## 2019-10-29 LAB — PROCALCITONIN: Procalcitonin: 0.15 ng/mL

## 2019-10-29 IMAGING — DX DG CHEST 1V PORT
1 series · 1 of 1 positions shown · non-contrast
Comparison: [DATE]

CLINICAL DATA: Endotracheal tube

EXAM:
PORTABLE CHEST 1 VIEW

[chest ap]
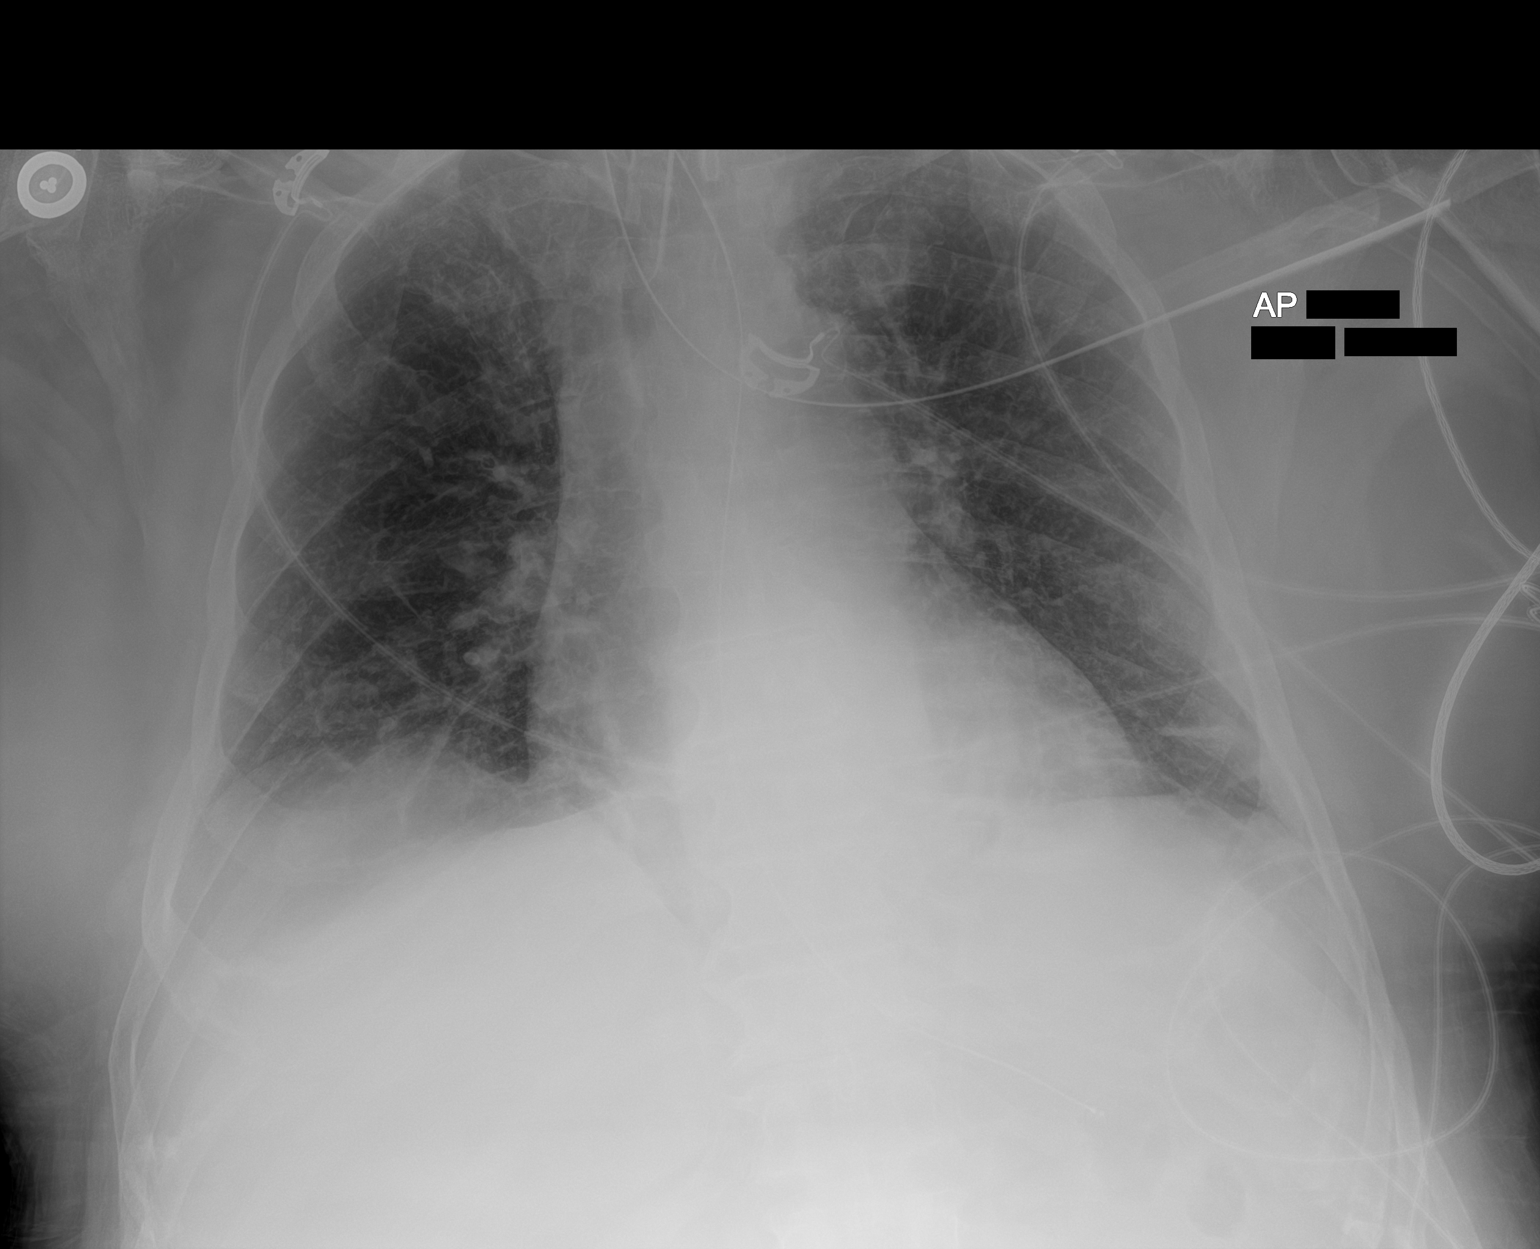

[1 of 1 positions shown; findings below may reference images not displayed]

FINDINGS: Endotracheal tube is approximately 5 cm above the carina. Enteric
tube passes into the stomach.

Mild interstitial prominence and mild basilar atelectasis. No
substantial change. No pleural effusion or pneumothorax.
IMPRESSION: Lines and tubes as above.  Stable lung aeration.

## 2019-10-29 MED ORDER — MIDAZOLAM HCL 2 MG/2ML IJ SOLN
1.0000 mg | INTRAMUSCULAR | Status: AC | PRN
  Administered 2019-10-30 – 2019-11-02 (×3): 1 mg via INTRAVENOUS
  Filled 2019-10-29 (×8): qty 2

## 2019-10-29 MED ORDER — FENTANYL BOLUS VIA INFUSION
25.0000 ug | INTRAVENOUS | Status: DC | PRN
  Administered 2019-10-31 – 2019-11-03 (×20): 25 ug via INTRAVENOUS
  Filled 2019-10-29: qty 25

## 2019-10-29 MED ORDER — MIDAZOLAM HCL 2 MG/2ML IJ SOLN
1.0000 mg | INTRAMUSCULAR | Status: DC | PRN
  Administered 2019-10-29 – 2019-11-03 (×23): 1 mg via INTRAVENOUS
  Filled 2019-10-29 (×18): qty 2

## 2019-10-29 MED ORDER — DOCUSATE SODIUM 50 MG/5ML PO LIQD
100.0000 mg | Freq: Two times a day (BID) | ORAL | Status: DC
  Administered 2019-10-29 – 2019-11-07 (×12): 100 mg
  Filled 2019-10-29 (×14): qty 10

## 2019-10-29 MED ORDER — FENTANYL CITRATE (PF) 100 MCG/2ML IJ SOLN: 25.0000 ug | Freq: Once | INTRAMUSCULAR | Status: DC

## 2019-10-29 MED ORDER — FENTANYL CITRATE (PF) 100 MCG/2ML IJ SOLN
25.0000 ug | INTRAMUSCULAR | Status: DC | PRN
  Administered 2019-10-29 (×2): 100 ug via INTRAVENOUS
  Filled 2019-10-29 (×4): qty 2

## 2019-10-29 MED ORDER — MIDAZOLAM HCL 2 MG/2ML IJ SOLN
INTRAMUSCULAR | Status: AC
  Administered 2019-10-29: 2 mg
  Filled 2019-10-29: qty 2

## 2019-10-29 MED ORDER — FENTANYL CITRATE (PF) 100 MCG/2ML IJ SOLN: 25.0000 ug | INTRAMUSCULAR | Status: DC | PRN

## 2019-10-29 MED ORDER — CHLORHEXIDINE GLUCONATE 0.12% ORAL RINSE (MEDLINE KIT)
15.0000 mL | Freq: Two times a day (BID) | OROMUCOSAL | Status: DC
  Administered 2019-10-29 – 2019-11-04 (×12): 15 mL via OROMUCOSAL

## 2019-10-29 MED ORDER — FENTANYL CITRATE (PF) 100 MCG/2ML IJ SOLN
100.0000 ug | Freq: Once | INTRAMUSCULAR | Status: AC
  Administered 2019-10-29: 100 ug via INTRAVENOUS

## 2019-10-29 MED ORDER — INSULIN GLARGINE 100 UNIT/ML ~~LOC~~ SOLN
8.0000 [IU] | Freq: Every day | SUBCUTANEOUS | Status: DC
  Administered 2019-10-29: 8 [IU] via SUBCUTANEOUS
  Filled 2019-10-29 (×2): qty 0.08

## 2019-10-29 MED ORDER — ENOXAPARIN SODIUM 60 MG/0.6ML ~~LOC~~ SOLN
60.0000 mg | SUBCUTANEOUS | Status: DC
  Administered 2019-10-29 – 2019-11-10 (×13): 60 mg via SUBCUTANEOUS
  Filled 2019-10-29 (×15): qty 0.6

## 2019-10-29 MED ORDER — FREE WATER
250.0000 mL | Status: DC
  Administered 2019-10-29 – 2019-10-30 (×6): 250 mL

## 2019-10-29 MED ORDER — FENTANYL 2500MCG IN NS 250ML (10MCG/ML) PREMIX INFUSION
25.0000 ug/h | INTRAVENOUS | Status: DC
  Administered 2019-10-29: 25 ug/h via INTRAVENOUS
  Administered 2019-10-30 (×2): 200 ug/h via INTRAVENOUS
  Administered 2019-10-31: 21:00:00 150 ug/h via INTRAVENOUS
  Administered 2019-10-31: 17:00:00 200 ug/h via INTRAVENOUS
  Administered 2019-10-31: 07:00:00 150 ug/h via INTRAVENOUS
  Administered 2019-11-01: 200 ug/h via INTRAVENOUS
  Administered 2019-11-01: 150 ug/h via INTRAVENOUS
  Administered 2019-11-03: 09:00:00 75 ug/h via INTRAVENOUS
  Filled 2019-10-29 (×9): qty 250

## 2019-10-29 MED ORDER — DEXMEDETOMIDINE HCL IN NACL 400 MCG/100ML IV SOLN
0.4000 ug/kg/h | INTRAVENOUS | Status: DC
  Administered 2019-10-29: 13:00:00 0.4 ug/kg/h via INTRAVENOUS

## 2019-10-29 MED ORDER — MIDAZOLAM HCL 2 MG/2ML IJ SOLN: 2.0000 mg | Freq: Once | INTRAMUSCULAR | Status: AC

## 2019-10-29 MED ORDER — DEXTROSE 5 % IV SOLN
1000.0000 mL | INTRAVENOUS | Status: AC
  Administered 2019-10-29: 1000 mL via INTRAVENOUS

## 2019-10-29 MED ORDER — ORAL CARE MOUTH RINSE
15.0000 mL | OROMUCOSAL | Status: DC
  Administered 2019-10-29 – 2019-11-04 (×59): 15 mL via OROMUCOSAL

## 2019-10-29 MED ORDER — DEXMEDETOMIDINE HCL IN NACL 400 MCG/100ML IV SOLN: 0.0000 ug/kg/h | INTRAVENOUS | Status: DC

## 2019-10-29 MED ORDER — FENTANYL CITRATE (PF) 100 MCG/2ML IJ SOLN
INTRAMUSCULAR | Status: AC
  Administered 2019-10-29: 100 ug
  Filled 2019-10-29: qty 2

## 2019-10-29 MED ORDER — VITAL HIGH PROTEIN PO LIQD
1000.0000 mL | ORAL | Status: AC
  Administered 2019-10-29 – 2019-11-05 (×8): 1000 mL
  Filled 2019-10-29 (×2): qty 1000

## 2019-10-29 MED ORDER — ETOMIDATE 2 MG/ML IV SOLN
20.0000 mg | Freq: Once | INTRAVENOUS | Status: AC
  Administered 2019-10-29: 20 mg via INTRAVENOUS

## 2019-10-29 MED ORDER — POLYETHYLENE GLYCOL 3350 17 G PO PACK
17.0000 g | PACK | Freq: Every day | ORAL | Status: DC | PRN
  Administered 2019-10-30: 17 g
  Filled 2019-10-29: qty 1

## 2019-10-29 MED ORDER — SENNOSIDES-DOCUSATE SODIUM 8.6-50 MG PO TABS
1.0000 | ORAL_TABLET | Freq: Two times a day (BID) | ORAL | Status: DC
  Administered 2019-10-29 – 2019-11-07 (×9): 1
  Filled 2019-10-29 (×12): qty 1

## 2019-10-29 MED ORDER — ROCURONIUM BROMIDE 50 MG/5ML IV SOLN
75.0000 mg | Freq: Once | INTRAVENOUS | Status: AC
  Administered 2019-10-29: 75 mg via INTRAVENOUS
  Filled 2019-10-29: qty 7.5

## 2019-10-29 NOTE — Progress Notes (Signed)
eLink Physician-Brief Progress Note Patient Name: Tommy Green DOB: 15-Oct-1946 MRN: [id number]   Date of Service  10/29/2019  HPI/Events of Note  Na 159. Patient has been on NS mIVF and does not have an intact thirst mechanism.   eICU Interventions  Discontinue NS infusion.  Start D5W infusion at 125cc/hr x1 L.  Recheck BMP Q4H to ensure Na is trending in right direction at an appropriate rate. D5W infusion rate may need adjustment.  Goal Na 150-152 over next 24 hours.     Intervention Category Major Interventions: Electrolyte abnormality - evaluation and management  Marily Lente Jarissa Sheriff 10/29/2019, 4:53 AM

## 2019-10-29 NOTE — Progress Notes (Signed)
SLP Cancellation Note  Patient Details Name: Tommy Green MRN: [id number] DOB: 1945-12-29   Cancelled treatment:       Reason Eval/Treat Not Completed: Medical issues which prohibited therapy. Pt intubated this morning. Discussed with RN. Will s/o for now while he is on the vent but please reorder SLP services when ready.    Osie Bond., M.A. [place] Acute Rehabilitation Services Pager [phone] Office [phone]  10/29/2019, 10:35 AM

## 2019-10-29 NOTE — Procedures (Signed)
Intubation Procedure Note Tommy Green [id number] July 01, 1946  Procedure: Intubation Indications: Airway protection and maintenance  Procedure Details Consent: Unable to obtain consent because of emergent medical necessity. Time Out: Verified patient identification, verified procedure, site/side was marked, verified correct patient position, special equipment/implants available, medications/allergies/relevent history reviewed, required imaging and test results available.  Performed  Maximum sterile technique was used including antiseptics, gloves, hand hygiene and mask.  MAC    Evaluation Hemodynamic Status: BP stable throughout; O2 sats: stable throughout Patient's Current Condition: stable Complications: No apparent complications Patient did tolerate procedure well. Chest X-ray ordered to verify placement.  CXR: pending.   Tommy Green 10/29/2019

## 2019-10-29 NOTE — Progress Notes (Signed)
Initial Nutrition Assessment  DOCUMENTATION CODES:   Not applicable  INTERVENTION:  Initiate Vital High Protein @ 33mLs/hour via OG tube. At goal, tube feeding will provide 1680 mLs tube feeding, 1680 kcals, 147g protein, 1436mL free water. Total free water  is 2996mL with current 258mL free water flushes every 4 hours.   NUTRITION DIAGNOSIS:   Inadequate oral intake related to inability to eat as evidenced by NPO status.   GOAL:   Patient will meet greater than or equal to 90% of their needs   MONITOR:   Vent status, TF tolerance  REASON FOR ASSESSMENT:   Consult Enteral/tube feeding initiation and management  ASSESSMENT:    Pt with a PMH significant for DM, HTN, HLD, CVA. Pt suffered blunt head trauma several weeks ago and subsequently developed confusion, aphasia, and mobility problems to the point of inability to perform ADLs. Pt found to have loculated AoC left SDH with mass effect requiring craniotomy and evacuation of SDH.  1/7 craniotomy and evacuation of SDH, extubated in PACU, transferred to ICU 1/11 intubated   Medications reviewed and include: Colace, 240mL free water flush every 4 hours, SSI, Lantus 8 units daily, Senokot-S  Labs reviewed: Sodium 156 (H), BUN/Cr 44/1.42 (H), CBGs 145-295  UOP: 1,865mL I/O: -825.50mL since admit  Of note, Pt has not had a BM documented since admission.  Patient is currently intubated on ventilator support MV: 11 L/min Temp (24hrs), Avg:99.4 F (37.4 C), Min:99 F (37.2 C), Max:99.9 F (37.7 C)    NUTRITION - FOCUSED PHYSICAL EXAM:    Most Recent Value  Orbital Region  No depletion  Upper Arm Region  Mild depletion  Thoracic and Lumbar Region  Unable to assess  Buccal Region  Unable to assess  Temple Region  Mild depletion  Clavicle Bone Region  Mild depletion  Clavicle and Acromion Bone Region  Mild depletion  Scapular Bone Region  No depletion  Dorsal Hand  Mild depletion  Patellar Region  Moderate  depletion  Anterior Thigh Region  Moderate depletion  Posterior Calf Region  Moderate depletion  Edema (RD Assessment)  None  Hair  Reviewed  Eyes  Reviewed  Mouth  Reviewed  Skin  Reviewed  Nails  Reviewed       Diet Order:   Diet Order            Diet NPO time specified  Diet effective now              EDUCATION NEEDS:      Skin:  Skin Assessment: Reviewed RN Assessment  Last BM:  PTA  Height:   Ht Readings from Last 1 Encounters:  10/25/19 5\' 4"  (1.626 m)    Weight:   Wt Readings from Last 1 Encounters:  10/25/19 118.4 kg    Ideal Body Weight:  59.09 kg  BMI:  Body mass index is 44.8 kg/m.  Estimated Nutritional Needs:   Kcal:  1700  Protein:  120-150 grams  Fluid:  1.7L   Larkin Ina, MS, RD, LDN

## 2019-10-29 NOTE — Progress Notes (Addendum)
NAME:  Tommy Green, MRN:  [id number], DOB:  1946-08-23, LOS: 5 ADMISSION DATE:  10/24/2019, CONSULTATION DATE:  10/25/19 REFERRING MD:  Marcello Moores  CHIEF COMPLAINT:  Vent management   Brief History   Tommy Green is a 74 y.o. male who was taken to OR 1/7 for craniotomy and evacuation of SDH.  He was extubated in PACU prior to transfer to ICU.  History of present illness    Tommy Green is a 74 y.o. male who has a PMH as outlined below.  He suffered blunt head trauma a few weeks back and had no concerning findings on imaging.  Subsequently developed confusion, aphasia, mobility problems to the point he could no longer perform any ADLs.  He presented to [facility] ED 1/6 and CT head showed loculated AoC left SDH with mass effect.  He was transferred to [facility] for neurosurgery evaluation.  On 1/7, he was taken to OR for craniotomy and evacuation of SDH.  PCCM consulted for vent management post op; however, pt was extubated in PACU prior to transfer to ICU.  Pt was seen 10/26/2019 and Dr. Elsworth Soho signed off. I was paged to the bedside 1/9 at 1:48 pm for increasing respiratory distress and altered mental status.  Pt was added back to the PCCM list  Past Medical History  has Pain in joint, ankle and foot; Essential hypertension; Hyperlipidemia associated with type 2 diabetes mellitus ([place]); Type 2 diabetes mellitus with diabetic neuropathy, unspecified ([place]); Heart murmur; Hypertriglyceridemia; History of stroke; Skin lesion; Squamous cell carcinoma in situ (SCCIS) of skin; Granuloma annulare; Tendinopathy of right gluteus medius; Tendinopathy of left gluteus medius; Neoplasm of uncertain behavior; Bilateral hip pain; AK (actinic keratosis); Neuropathy; Cyclic vomiting syndrome; Chronic pain of right knee; and Subdural hematoma (HCC) on their problem list.  Significant Hospital Events   1/7 > admit.  Consults:    Procedures:  ETT 1/7 > 1/7, 1/11 >>>  Significant Diagnostic Tests:  CT head 1/6 >  large AoC L SDH with mass effect and 5.6mm L to R MLS. CT head 1/9 > no new abnormalities  Micro Data:  SARS CoV 2 1/6 > neg  Antimicrobials:    Interim history/subjective:  Remains obtunded. No acute events.   Objective:  Blood pressure (!) 157/98, pulse 96, temperature 99.9 F (37.7 C), temperature source Oral, resp. rate (!) 29, height 5\' 4"  (1.626 m), weight 118.4 kg, SpO2 96 %.        Intake/Output Summary (Last 24 hours) at 10/29/2019 0817 Last data filed at 10/29/2019 0600 Gross per 24 hour  Intake 1724.19 ml  Output 1700 ml  Net 24.19 ml   Filed Weights   10/25/19 1256  Weight: 118.4 kg    Examination:  General - obtunded overweight male Eyes - Pupils 86mm and sluggish ENT - snoring, no stridor.  Cardiac - RRR no MRG  Resp - scattered rhonchi, Tachypeic and paradoxical respirations. Poor air entry. Abdomen - soft, non tender, + bowel sounds Extremities - no acute deformity  Skin - grossly intact Neuro - obtunded. No meaningful response.    Assessment & Plan:   Acute hypoxic respiratory failure with concern for compromised. - Intubate for airway protection - Full vent support - ABG, CXR post intubation - PRN sedation - Daily SBT  Fever. Improved, low PCT - defer ABX - Follow WBC and fever curve  SDH s/p evacuation. - per neurosurgery  CKD 2 Hypernatremia Hyperchloremia - continue D5W - Once intubated can add free  water - R/o DI with urine Na, Osm - Follow BMP  Dysphagia. - place NGT once intubated. Start TF  Anemia of critical illness. - f/u CBC - transfuse for Hb < 7  DM type II. - SSI - Add lantus - Hold home metformin, tresiba.  Suspected OSA - would benefit from CPAP if able to improve.   Hx HTN, HLD. - goal SBP < 160 - Hold home carvedilol, HCTZ, lisinopril, fenofibrate, lovastatin - can resume when able to take PO safely.   Best Practice:  Diet: NPO DVT prophylaxis: SCD's GI prophylaxis: N/A Mobility: Bedrest Code  Status: Full  Disposition: ICU  Labs:   CMP Latest Ref Rng & Units 10/29/2019 10/29/2019 10/28/2019  Glucose 70 - 99 mg/dL 236(H) 229(H) 242(H)  BUN 8 - 23 mg/dL 43(H) 44(H) 48(H)  Creatinine 0.61 - 1.24 mg/dL 1.39(H) 1.38(H) 1.42(H)  Sodium 135 - 145 mmol/L 159(H) 159(H) 154(H)  Potassium 3.5 - 5.1 mmol/L 4.2 4.2 4.3  Chloride 98 - 111 mmol/L 128(H) 128(H) 122(H)  CO2 22 - 32 mmol/L 21(L) 21(L) 21(L)  Calcium 8.9 - 10.3 mg/dL 8.9 9.0 8.9  Total Protein 6.5 - 8.1 g/dL - - -  Total Bilirubin 0.3 - 1.2 mg/dL - - -  Alkaline Phos 38 - 126 U/L - - -  AST 15 - 41 U/L - - -  ALT 0 - 44 U/L - - -    CBC Latest Ref Rng & Units 10/29/2019 10/28/2019 10/27/2019  WBC 4.0 - 10.5 K/uL 9.7 9.1 -  Hemoglobin 13.0 - 17.0 g/dL 8.8(L) 8.7(L) 8.5(L)  Hematocrit 39.0 - 52.0 % 28.9(L) 28.2(L) 25.0(L)  Platelets 150 - 400 K/uL 409(H) 420(H) -    ABG    Component Value Date/Time   PHART 7.429 10/27/2019 1556   PCO2ART 29.0 (L) 10/27/2019 1556   PO2ART 61.0 (L) 10/27/2019 1556   HCO3 19.1 (L) 10/27/2019 1556   TCO2 20 (L) 10/27/2019 1556   ACIDBASEDEF 4.0 (H) 10/27/2019 1556   O2SAT 91.0 10/27/2019 1556    CBG (last 3)  Recent Labs    10/28/19 1949 10/28/19 2321 10/29/19 0344  GLUCAP 145* 183* 198*    CC time 32 minutes  Chesley Mires, MD [place] [facility]monary/Critical Care 10/29/2019, 8:17 AM  Attending Note:  74 year old with SDH s/p evacuation who is profoundly hypernatremic.  Patient is altered this morning and not following commands.  On exam, unresponsive and not protecting his airway.  I reviewed CXR myself, no acute disease noted.  Discussed with PCCM-NP.  Will proceed with intubation and full mechanical support.  TF and free water as ordered.  BMET in AM.  D5W ordered.  Strict I/O.  Send urine workup for DI.  Family updated by NP.  PCCM will continue to manage.  The patient is critically ill with multiple organ systems failure and requires high complexity decision making for  assessment and support, frequent evaluation and titration of therapies, application of advanced monitoring technologies and extensive interpretation of multiple databases.   Critical Care Time devoted to patient care services described in this note is  31  Minutes. This time reflects time of care of this signee Dr [name]. This critical care time does not reflect procedure time, or teaching time or supervisory time of PA/NP/Med student/Med Resident etc but could involve care discussion time.  Rush Farmer, M.D. [facility] Pulmonary/Critical Care Medicine.

## 2019-10-29 NOTE — Progress Notes (Signed)
Subjective: Intubated for hypoxic respiratory failure this morning  Objective: Vital signs in last 24 hours: Temp:  [99 F (37.2 C)-99.9 F (37.7 C)] 99.7 F (37.6 C) (01/11 1300) Pulse Rate:  [30-102] 89 (01/11 0948) Resp:  [15-37] 17 (01/11 0948) BP: (109-169)/(65-108) 151/91 (01/11 0948) SpO2:  [93 %-100 %] 99 % (01/11 0948) FiO2 (%):  [100 %] 100 % (01/11 0930)  Intake/Output from previous day: 01/10 0701 - 01/11 0700 In: 1799.2 [I.V.:1699.2; IV Piggyback:100] Out: [id number] [Urine:1840] Intake/Output this shift: Total I/O In: 456.6 [I.V.:456.6] Out: 300 [Urine:300]  Patient receiving medications for intubation-- unable to perform detailed neurologic exam MAEs  Lab Results: Recent Labs    10/28/19 0809 10/29/19 0340 10/29/19 1008  WBC 9.1 9.7  --   HGB 8.7* 8.8* 8.8*  HCT 28.2* 28.9* 26.0*  PLT 420* 409*  --    BMET Recent Labs    10/29/19 1031 10/29/19 1226  NA 156* 156*  K 4.3 4.4  CL 126* 125*  CO2 21* 21*  GLUCOSE 304* 279*  BUN 43* 44*  CREATININE 1.35* 1.42*  CALCIUM 8.9 8.7*    Studies/Results: CT HEAD WO CONTRAST  Result Date: 10/27/2019 CLINICAL DATA:  Weakness. The patient is status post left craniotomy for evacuation of subdural hematoma 10/25/2019. EXAM: CT HEAD WITHOUT CONTRAST TECHNIQUE: Contiguous axial images were obtained from the base of the skull through the vertex without intravenous contrast. COMPARISON:  Head CT scan 10/26/2019 and 10/24/2019. FINDINGS: Brain: Pneumocephalus on the left from evacuation of the subdural hematoma is again seen. There is a small amount of residual hemorrhage present but no midline shift or new hemorrhage. Thickness of the collection over the left convexities is approximately 0.7 cm compared to 1.3 cm when measured near the same location as on yesterday's study. A small amount of hemorrhage is seen layering in the lateral ventricles, unchanged. Possible trace amount of subarachnoid hemorrhage in the right frontal  lobe is unchanged. No hydrocephalus. Vascular: No hyperdense vessel or unexpected calcification. Skull: Left craniotomy defect again seen. Sinuses/Orbits: Scalp swelling related to surgery noted. Other: None. IMPRESSION: No new abnormality since the prior examination. Status post left craniotomy for evacuation of a subdural hematoma. Residual blood and fluid over the left convexities has slightly decreased. Possible trace amount of subarachnoid hemorrhage in the right frontal lobe and small volume of hemorrhage in the lateral ventricles is unchanged. Electronically Signed   By: Inge Rise M.D.   On: 10/27/2019 17:33   DG Chest Port 1 View  Result Date: 10/29/2019 CLINICAL DATA:  Endotracheal tube EXAM: PORTABLE CHEST 1 VIEW COMPARISON:  10/27/2018 FINDINGS: Endotracheal tube is approximately 5 cm above the carina. Enteric tube passes into the stomach. Mild interstitial prominence and mild basilar atelectasis. No substantial change. No pleural effusion or pneumothorax. IMPRESSION: Lines and tubes as above.  Stable lung aeration. Electronically Signed   By: Macy Mis M.D.   On: 10/29/2019 09:10   DG CHEST PORT 1 VIEW  Result Date: 10/28/2019 CLINICAL DATA:  Respiratory failure. EXAM: PORTABLE CHEST 1 VIEW COMPARISON:  10/27/2019 FINDINGS: The patient is slightly rotated to the left with grossly unchanged enlargement of the cardiac silhouette. There is persistent pulmonary vascular congestion with increased interstitial densities bilaterally. Additional mild bibasilar opacities likely reflect atelectasis. The right lateral costophrenic angle was incompletely imaged. No large pleural effusion or pneumothorax is identified. IMPRESSION: Cardiomegaly with increased densities likely reflecting edema. Electronically Signed   By: Logan Bores M.D.   On: 10/28/2019 09:37  Assessment/Plan: POD#4 s/p craniotomy for evacuation of large acute on chronic SDH.  Unfortunately, his respiratory status declined  and he required intubation by the critical care service.  Will defer to the critical care service for management.  His repeat CT redemonstrated excellent SDH evacuation and his previously seen subfalcine herniation and mass effect have almost fully resolved.  Unfortunately, he has had a difficult recovery due to his age and multiple medical comorbidities.  I had a long discussion with his wife [name] via phone.  The patient had made clear he would not want heroic efforts with prolonged intubation, tracheostomy or feeding tubes.  She states she has an advanced directive stating such as well. As it is still relatively early from his surgery, I would like to give him at least a few more days to see if he improves.  However, if a long, arduous recovery appears likely, switching goals of care to comfort care would be appropriate.  She is in full agreement with this plan.   Vallarie Mare 10/29/2019, 2:08 PM

## 2019-10-29 NOTE — Progress Notes (Signed)
OT Cancellation Note  Patient Details Name: Tommy Green MRN: [id number] DOB: 1946-01-21   Cancelled Treatment:    Reason Eval/Treat Not Completed: Medical issues which prohibited therapy;Patient's level of consciousness(Pt with a decline in medical status. OT not appropriate.) Pt with several cancelations due to worsening medical status and surgeries. Pt intubated and not medically stable for OT eval at this time. OT signing off.  Please re-order OT eval as medically appropriate.  Jefferey Pica OTR/L Acute Rehabilitation Services Pager: [phone] Office: [phone]   Jefferey Pica 10/29/2019, 1:27 PM

## 2019-10-29 NOTE — Progress Notes (Signed)
CRITICAL VALUE ALERT  Critical Value:  Serum osmol 348  Date & Time Notied:  1/11; 1230

## 2019-10-29 NOTE — Procedures (Signed)
OGT Placement By MD  OGT placed under laryngoscopy and verified by auscultation   Rush Farmer, M.D. [facility] Pulmonary/Critical Care Medicine.

## 2019-10-29 NOTE — Progress Notes (Signed)
Physical Therapy Discharge Patient Details Name: Tommy Green MRN: [id number] DOB: [birth year] Today's Date: 10/29/2019 Time:  -     Patient discharged from PT services secondary to medical decline - will need to re-order PT to resume therapy services.  Please see latest therapy progress note for current level of functioning and progress toward goals.    Progress and discharge plan discussed with patient and/or caregiver: Patient unable to participate in discharge planning and no caregivers available   Tommy Green, PT Pager [phone]  GP     Tommy Green Tommy Green 10/29/2019, 10:59 AM

## 2019-10-30 ENCOUNTER — Inpatient Hospital Stay (HOSPITAL_COMMUNITY): Payer: Medicare HMO

## 2019-10-30 LAB — POCT I-STAT 7, (LYTES, BLD GAS, ICA,H+H)
Acid-base deficit: 4 mmol/L — ABNORMAL HIGH (ref 0.0–2.0)
Acid-base deficit: 4 mmol/L — ABNORMAL HIGH (ref 0.0–2.0)
Bicarbonate: 22.2 mmol/L (ref 20.0–28.0)
Bicarbonate: 23.2 mmol/L (ref 20.0–28.0)
Calcium, Ion: 1.33 mmol/L (ref 1.15–1.40)
Calcium, Ion: 1.34 mmol/L (ref 1.15–1.40)
HCT: 25 % — ABNORMAL LOW (ref 39.0–52.0)
HCT: 26 % — ABNORMAL LOW (ref 39.0–52.0)
Hemoglobin: 8.5 g/dL — ABNORMAL LOW (ref 13.0–17.0)
Hemoglobin: 8.8 g/dL — ABNORMAL LOW (ref 13.0–17.0)
O2 Saturation: 75 %
O2 Saturation: 92 %
Patient temperature: 98.9
Patient temperature: 98.9
Potassium: 4.1 mmol/L (ref 3.5–5.1)
Potassium: 4.1 mmol/L (ref 3.5–5.1)
Sodium: 157 mmol/L — ABNORMAL HIGH (ref 135–145)
Sodium: 158 mmol/L — ABNORMAL HIGH (ref 135–145)
TCO2: 24 mmol/L (ref 22–32)
TCO2: 25 mmol/L (ref 22–32)
pCO2 arterial: 45.2 mmHg (ref 32.0–48.0)
pCO2 arterial: 52.2 mmHg — ABNORMAL HIGH (ref 32.0–48.0)
pH, Arterial: 7.256 — ABNORMAL LOW (ref 7.350–7.450)
pH, Arterial: 7.299 — ABNORMAL LOW (ref 7.350–7.450)
pO2, Arterial: 47 mmHg — ABNORMAL LOW (ref 83.0–108.0)
pO2, Arterial: 72 mmHg — ABNORMAL LOW (ref 83.0–108.0)

## 2019-10-30 LAB — BASIC METABOLIC PANEL
Anion gap: 9 (ref 5–15)
BUN: 53 mg/dL — ABNORMAL HIGH (ref 8–23)
CO2: 22 mmol/L (ref 22–32)
Calcium: 8.8 mg/dL — ABNORMAL LOW (ref 8.9–10.3)
Chloride: 125 mmol/L — ABNORMAL HIGH (ref 98–111)
Creatinine, Ser: 1.62 mg/dL — ABNORMAL HIGH (ref 0.61–1.24)
GFR calc Af Amer: 47 mL/min — ABNORMAL LOW (ref >60)
GFR calc non Af Amer: 41 mL/min — ABNORMAL LOW (ref >60)
Glucose, Bld: 225 mg/dL — ABNORMAL HIGH (ref 70–99)
Potassium: 4.3 mmol/L (ref 3.5–5.1)
Sodium: 156 mmol/L — ABNORMAL HIGH (ref 135–145)

## 2019-10-30 LAB — CBC
HCT: 29.5 % — ABNORMAL LOW (ref 39.0–52.0)
Hemoglobin: 8.7 g/dL — ABNORMAL LOW (ref 13.0–17.0)
MCH: 26.5 pg (ref 26.0–34.0)
MCHC: 29.5 g/dL — ABNORMAL LOW (ref 30.0–36.0)
MCV: 89.9 fL (ref 80.0–100.0)
Platelets: 434 10*3/uL — ABNORMAL HIGH (ref 150–400)
RBC: 3.28 MIL/uL — ABNORMAL LOW (ref 4.22–5.81)
RDW: 15.9 % — ABNORMAL HIGH (ref 11.5–15.5)
WBC: 14.3 10*3/uL — ABNORMAL HIGH (ref 4.0–10.5)
nRBC: 0 % (ref 0.0–0.2)

## 2019-10-30 LAB — GLUCOSE, CAPILLARY
Glucose-Capillary: 211 mg/dL — ABNORMAL HIGH (ref 70–99)
Glucose-Capillary: 208 mg/dL — ABNORMAL HIGH (ref 70–99)
Glucose-Capillary: 201 mg/dL — ABNORMAL HIGH (ref 70–99)
Glucose-Capillary: 225 mg/dL — ABNORMAL HIGH (ref 70–99)
Glucose-Capillary: 188 mg/dL — ABNORMAL HIGH (ref 70–99)
Glucose-Capillary: 245 mg/dL — ABNORMAL HIGH (ref 70–99)

## 2019-10-30 LAB — MAGNESIUM
Magnesium: 2.2 mg/dL (ref 1.7–2.4)
Magnesium: 2 mg/dL (ref 1.7–2.4)

## 2019-10-30 LAB — PROCALCITONIN: Procalcitonin: 0.1 ng/mL

## 2019-10-30 LAB — PHOSPHORUS
Phosphorus: 3.4 mg/dL (ref 2.5–4.6)
Phosphorus: 3.9 mg/dL (ref 2.5–4.6)

## 2019-10-30 IMAGING — DX DG CHEST 1V PORT
1 series · 1 of 1 positions shown · non-contrast
Comparison: [DATE].

CLINICAL DATA: Intubation.  History of fall.

EXAM:
PORTABLE CHEST 1 VIEW

[chest ap]
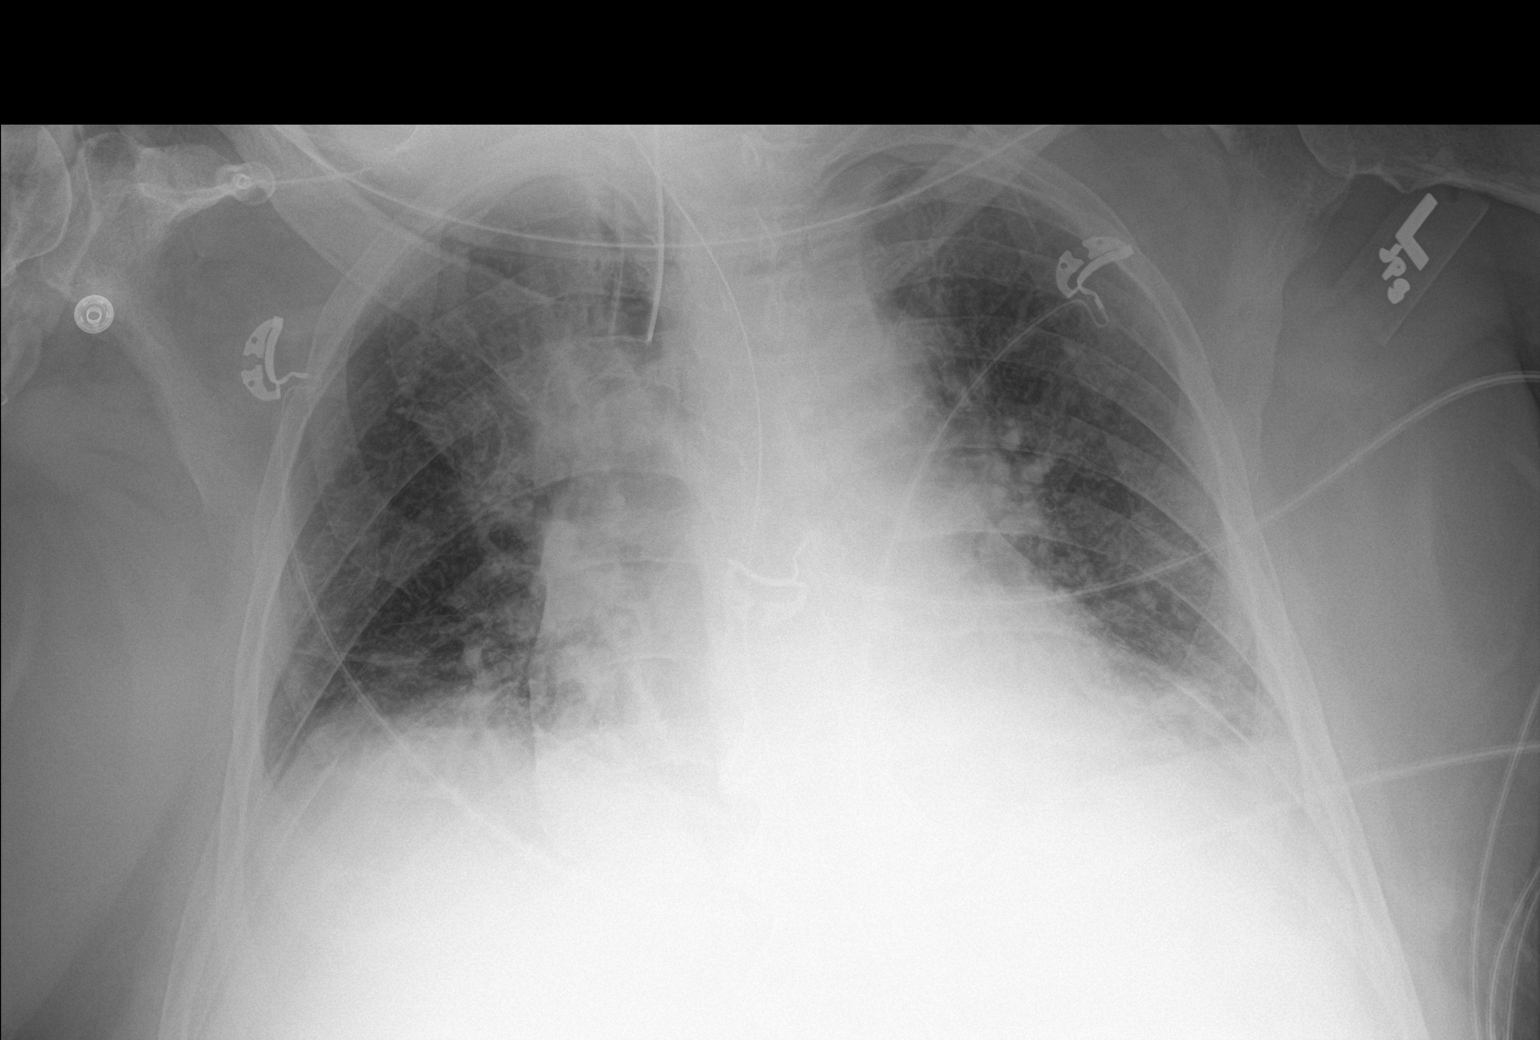

[1 of 1 positions shown; findings below may reference images not displayed]

FINDINGS: Endotracheal tube and NG tube in stable position. Cardiomegaly with
pulmonary venous congestion. Bibasilar atelectasis. Bibasilar
infiltrates/edema cannot be excluded. Small bilateral pleural
effusions. No pneumothorax. No acute bony abnormality identified.
IMPRESSION: 1.  Endotracheal tube and NG tube in stable position.

2.  Cardiomegaly with pulmonary venous congestion.

3. Bibasilar atelectasis. Bibasilar infiltrates/edema cannot be
excluded. Small bilateral pleural effusions noted. Mild CHF may be
present.

## 2019-10-30 IMAGING — DX DG ABD PORTABLE 1V
1 series · 1 of 1 positions shown · non-contrast
Comparison: None.

CLINICAL DATA: Enteric tube placement

EXAM:
PORTABLE ABDOMEN - 1 VIEW

[abdomen kub]
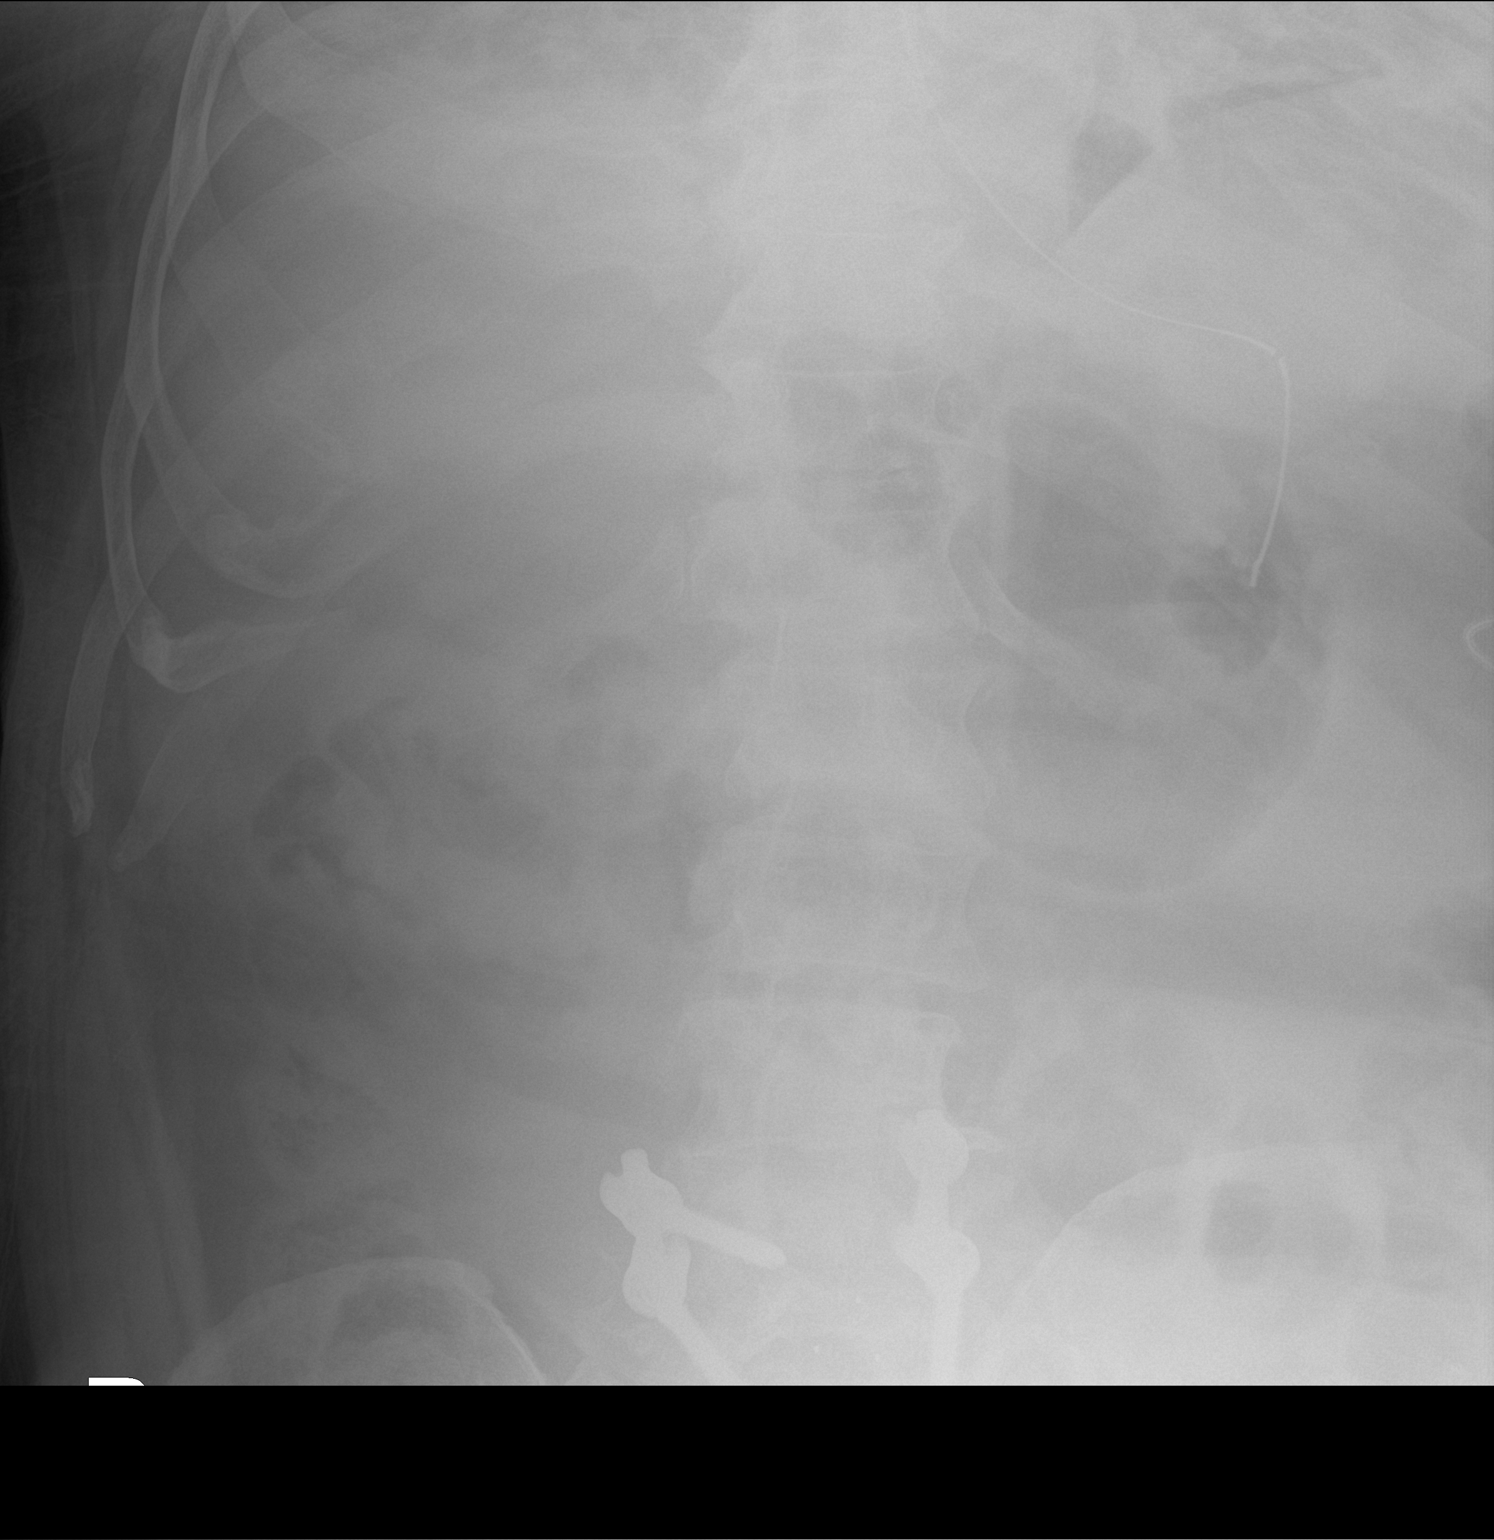

[1 of 1 positions shown; findings below may reference images not displayed]

FINDINGS: Enteric tube terminates in proximal stomach with side port also in
the proximal stomach. No disproportionately dilated small bowel
loops. Mild colonic stool. No evidence of pneumatosis or
pneumoperitoneum. Bilateral posterior spinal fusion hardware in the
lower lumbar spine. No radiopaque nephrolithiasis.
IMPRESSION: Well-positioned enteric tube with tip in the proximal stomach.

## 2019-10-30 MED ORDER — FREE WATER
350.0000 mL | Status: DC
  Administered 2019-10-30 – 2019-11-03 (×25): 350 mL

## 2019-10-30 MED ORDER — INSULIN GLARGINE 100 UNIT/ML ~~LOC~~ SOLN
12.0000 [IU] | Freq: Every day | SUBCUTANEOUS | Status: DC
  Administered 2019-10-30 – 2019-10-31 (×2): 12 [IU] via SUBCUTANEOUS
  Filled 2019-10-30 (×3): qty 0.12

## 2019-10-30 MED ORDER — INSULIN ASPART 100 UNIT/ML ~~LOC~~ SOLN
4.0000 [IU] | SUBCUTANEOUS | Status: DC
  Administered 2019-10-30 – 2019-10-31 (×8): 4 [IU] via SUBCUTANEOUS

## 2019-10-30 NOTE — Progress Notes (Signed)
Subjective: NAEs o/n  Objective: Vital signs in last 24 hours: Temp:  [98.5 F (36.9 C)-100.7 F (38.2 C)] 100.3 F (37.9 C) (01/12 0800) Pulse Rate:  [64-101] 95 (01/12 1112) Resp:  [9-25] 9 (01/12 1112) BP: (86-180)/(50-103) 113/74 (01/12 1000) SpO2:  [91 %-100 %] 93 % (01/12 1000) FiO2 (%):  [40 %] 40 % (01/12 1112) Weight:  [119.4 kg] 119.4 kg (01/12 0500)  Intake/Output from previous day: 01/11 0701 - 01/12 0700 In: 1940.5 [I.V.:562.4; NG/GT:1073.3; IV Piggyback:304.9] Out: 1150 [Urine:1150] Intake/Output this shift: Total I/O In: 137.3 [I.V.:71; IV Piggyback:66.3] Out: -   Intubated, sedated on Fentanyl gtt, intermittent Versed boluses. Incision c/d/i.  No drainage or redness. Eyes open to stim, pupils 1-2 mm. W/d in bilateral LEs and UEs, L>R   Lab Results: Recent Labs    10/29/19 0340 10/30/19 0402 10/30/19 0616  WBC 9.7  --  14.3*  HGB 8.8* 8.8* 8.7*  HCT 28.9* 26.0* 29.5*  PLT 409*  --  434*   BMET Recent Labs    10/29/19 1659 10/30/19 0402 10/30/19 0616  NA 157* 158* 156*  K 4.2 4.1 4.3  CL 128*  --  125*  CO2 22  --  22  GLUCOSE 218*  --  225*  BUN 45*  --  53*  CREATININE 1.50*  --  1.62*  CALCIUM 8.9  --  8.8*    Studies/Results: DG Chest Port 1 View  Result Date: 10/30/2019 CLINICAL DATA:  Intubation.  History of fall. EXAM: PORTABLE CHEST 1 VIEW COMPARISON:  10/29/2019. FINDINGS: Endotracheal tube and NG tube in stable position. Cardiomegaly with pulmonary venous congestion. Bibasilar atelectasis. Bibasilar infiltrates/edema cannot be excluded. Small bilateral pleural effusions. No pneumothorax. No acute bony abnormality identified. IMPRESSION: 1.  Endotracheal tube and NG tube in stable position. 2.  Cardiomegaly with pulmonary venous congestion. 3. Bibasilar atelectasis. Bibasilar infiltrates/edema cannot be excluded. Small bilateral pleural effusions noted. Mild CHF may be present. Electronically Signed   By: Marcello Moores  Register   On:  10/30/2019 06:07   DG Chest Port 1 View  Result Date: 10/29/2019 CLINICAL DATA:  Endotracheal tube EXAM: PORTABLE CHEST 1 VIEW COMPARISON:  10/27/2018 FINDINGS: Endotracheal tube is approximately 5 cm above the carina. Enteric tube passes into the stomach. Mild interstitial prominence and mild basilar atelectasis. No substantial change. No pleural effusion or pneumothorax. IMPRESSION: Lines and tubes as above.  Stable lung aeration. Electronically Signed   By: Macy Mis M.D.   On: 10/29/2019 09:10    Assessment/Plan: 74 yo M with multiple medical problems s/p L craniotomy for evacuation of large complex loculated SDH. He is intubated and remains poorly responsive.  SDH: s/p crani, 2 postop CTs showed excellent evacuation and relief of mass effect.  Continue Keppra x 7 days.  EEG was negative for nonconvulsive status epilepticus.  Respiratory failure/pulmonary edema: vent and fluid mgmt per CCM team-- appreciate their assistance  Leukocytosis: patient had mild leukocytosis but no fever this am.  Cultures pending  DM: Lantus + SSI  Goals of care:  Continue with aggressive care for now, but if no improvement by end of week, will discuss goals of care with wife as patient has advanced directive stating he would not want prolonged intubation or invasive procedures that would have little chance of improving quality-of-life    LOS: 6 days  Continue foley due to acute urinary retention and strict I&O   Vallarie Mare 10/30/2019, 11:21 AM

## 2019-10-30 NOTE — Progress Notes (Addendum)
NAME:  Tommy Green, MRN:  [id number], DOB:  May 01, 1946, LOS: 6 ADMISSION DATE:  10/24/2019, CONSULTATION DATE:  10/25/19 REFERRING MD:  Marcello Moores  CHIEF COMPLAINT:  Vent management   Brief History   Tommy Green is a 74 y.o. male who was taken to OR 1/7 for craniotomy and evacuation of SDH.  He was extubated in PACU prior to transfer to ICU.  History of present illness    Tommy Green is a 74 y.o. male who has a PMH as outlined below.  He suffered blunt head trauma a few weeks back and had no concerning findings on imaging.  Subsequently developed confusion, aphasia, mobility problems to the point he could no longer perform any ADLs.  He presented to [facility] ED 1/6 and CT head showed loculated AoC left SDH with mass effect.  He was transferred to [facility] for neurosurgery evaluation.  On 1/7, he was taken to OR for craniotomy and evacuation of SDH.  PCCM consulted for vent management post op; however, pt was extubated in PACU prior to transfer to ICU.  Pt was seen 10/26/2019 and Dr. Elsworth Soho signed off. I was paged to the bedside 1/9 at 1:48 pm for increasing respiratory distress and altered mental status.  Pt was added back to the PCCM list  Past Medical History  has Pain in joint, ankle and foot; Essential hypertension; Hyperlipidemia associated with type 2 diabetes mellitus ([place]); Type 2 diabetes mellitus with diabetic neuropathy, unspecified ([place]); Heart murmur; Hypertriglyceridemia; History of stroke; Skin lesion; Squamous cell carcinoma in situ (SCCIS) of skin; Granuloma annulare; Tendinopathy of right gluteus medius; Tendinopathy of left gluteus medius; Neoplasm of uncertain behavior; Bilateral hip pain; AK (actinic keratosis); Neuropathy; Cyclic vomiting syndrome; Chronic pain of right knee; SDH (subdural hematoma) ([place]); Respiratory failure ([place]); Acute encephalopathy; Hypernatremia; Dyspnea; and Acute respiratory failure with hypoxemia ([place]) on their problem list.  Significant Hospital  Events   1/7 > admit.  Consults:    Procedures:  ETT 1/7 > 1/7, 1/11 >>>  Significant Diagnostic Tests:  CT head 1/6 > large AoC L SDH with mass effect and 5.94mm L to R MLS. CT head 1/9 > no new abnormalities  Micro Data:  SARS CoV 2 1/6 > neg Blood 1/12 > Urine 1/12 > Trach asp 1/12 >  Antimicrobials:    Interim history/subjective:  Intubated yesterday. Tmax 100.7, WBC up to 14k. Unresponsive with sedation off. Weaning OK.   Objective:  Blood pressure 108/60, pulse 79, temperature 99 F (37.2 C), temperature source Oral, resp. rate (!) 21, height 5\' 4"  (1.626 m), weight 119.4 kg, SpO2 93 %.    Vent Mode: PSV;CPAP FiO2 (%):  [40 %-100 %] 40 % Set Rate:  [16 bmp] 16 bmp Vt Set:  [470 mL] 470 mL PEEP:  [5 cmH20] 5 cmH20 Pressure Support:  [5 cmH20] 5 cmH20 Plateau Pressure:  [11 cmH20-17 cmH20] 11 cmH20   Intake/Output Summary (Last 24 hours) at 10/30/2019 0729 Last data filed at 10/30/2019 0600 Gross per 24 hour  Intake 1940.54 ml  Output 1150 ml  Net 790.54 ml   Filed Weights   10/25/19 1256 10/30/19 0500  Weight: 118.4 kg 119.4 kg    Examination:  General:  Morbidly obese elderly male on vent Neuro:  Sedated, intermittent periods of agitation. No purposeful movement or command following.  HEENT:  Surgical staples in place, No JVD noted, PERRL Cardiovascular:  RRR,  3/6 SEM Lungs:  Clear bilateral breath sounds Abdomen:  Soft, non-distended, non-tender  Musculoskeletal:  No acute deformity Skin:  Intact, MMM  Assessment & Plan:   Acute hypoxic respiratory failure with concern for compromised. - Full vent support - ABG, CXR post intubation - Fentanyl infusion and PRN versed for RASS goal -1 to -2. (did not tolerate precedex) - Daily SBT  Leukocytosis w/ TMax 100.7 - defer ABX - send cultures - Follow WBC and fever curve  SDH s/p evacuation. - per neurosurgery  CKD 2 Hypernatremia (Serum: osm 348, Na 159. Urine: Osm 610, Na  80) Hyperchloremia - Increase free water - Follow BMP  Dysphagia. - place NGT once intubated. - Tube feeds  Anemia of critical illness. - f/u CBC - transfuse for Hb < 7  DM type II. - SSI - Increase lantus to 12 units daily - Add TF coverage 4 units every 4 hours - Hold home metformin, tresiba.  Suspected OSA - would benefit from CPAP if able to improve.   Hx HTN, HLD. - goal SBP < 160 - Hold home carvedilol, HCTZ, lisinopril, fenofibrate, lovastatin - can resume when able to take PO safely.   Best Practice:  Diet: NPO DVT prophylaxis: SCD's GI prophylaxis: N/A Mobility: Bedrest Code Status: Full  Disposition: ICU  Labs:   CMP Latest Ref Rng & Units 10/30/2019 10/30/2019 10/30/2019  Glucose 70 - 99 mg/dL 225(H) - -  BUN 8 - 23 mg/dL 53(H) - -  Creatinine 0.61 - 1.24 mg/dL 1.62(H) - -  Sodium 135 - 145 mmol/L 156(H) 158(H) 157(H)  Potassium 3.5 - 5.1 mmol/L 4.3 4.1 4.1  Chloride 98 - 111 mmol/L 125(H) - -  CO2 22 - 32 mmol/L 22 - -  Calcium 8.9 - 10.3 mg/dL 8.8(L) - -  Total Protein 6.5 - 8.1 g/dL - - -  Total Bilirubin 0.3 - 1.2 mg/dL - - -  Alkaline Phos 38 - 126 U/L - - -  AST 15 - 41 U/L - - -  ALT 0 - 44 U/L - - -    CBC Latest Ref Rng & Units 10/30/2019 10/30/2019 10/30/2019  WBC 4.0 - 10.5 K/uL 14.3(H) - -  Hemoglobin 13.0 - 17.0 g/dL 8.7(L) 8.8(L) 8.5(L)  Hematocrit 39.0 - 52.0 % 29.5(L) 26.0(L) 25.0(L)  Platelets 150 - 400 K/uL 434(H) - -    ABG    Component Value Date/Time   PHART 7.299 (L) 10/30/2019 0402   PCO2ART 45.2 10/30/2019 0402   PO2ART 72.0 (L) 10/30/2019 0402   HCO3 22.2 10/30/2019 0402   TCO2 24 10/30/2019 0402   ACIDBASEDEF 4.0 (H) 10/30/2019 0402   O2SAT 92.0 10/30/2019 0402    CBG (last 3)  Recent Labs    10/29/19 2017 10/29/19 2353 10/30/19 0402  GLUCAP 229* 232* 211*    CC time [place], AGACNP-BC [place] for personal pager PCCM on call pager [phone]  10/30/2019 7:42 AM  Attending Note:  72 yea rold male with SDH who was taken to the OR on 1/7 for evacuation and craniotomy.  PCCM was consulted on 1/11 when patient was not arousable and was not protecting his airway and was intubated.  Overnight, no further events, intermittent vent dyssynchrony.  On exam, not arousable or following commands with clear lungs and clear lung dyssynchrony on PRVC.  I reviewed CXR myself, ETT is in a good position.  Discussed with PCCM-NP.  Will begin PS trials but no extubation given mental status.  Monitor WBC and fever curve, no abx  for now.  Minimize sedation as able.  NGT in place.  Begin TF per nutrition.  Will need to discuss prognosis with NS and family to decide plan of care but will allow a few days first since was just intubated on 1/11.  Discussed with bedside RN and all issues addressed.  The patient is critically ill with multiple organ systems failure and requires high complexity decision making for assessment and support, frequent evaluation and titration of therapies, application of advanced monitoring technologies and extensive interpretation of multiple databases.   Critical Care Time devoted to patient care services described in this note is  31  Minutes. This time reflects time of care of this signee Dr [name]. This critical care time does not reflect procedure time, or teaching time or supervisory time of PA/NP/Med student/Med Resident etc but could involve care discussion time.  Rush Farmer, M.D. [facility] Pulmonary/Critical Care Medicine.

## 2019-10-30 NOTE — Progress Notes (Signed)
Cortrak Tube Team Note:  Consult received to place a Cortrak feeding tube.   Due to high volume will be unable to place Cortrak feeding tube today. Patient currently has OGT and is receiving feeds. Next date of Cortrak service is 1/15.  Jacklynn Barnacle, MS, RD, LDN Office: [phone] Pager: [phone] After Hours/Weekend Pager: [phone]

## 2019-10-31 DIAGNOSIS — N189 Chronic kidney disease, unspecified: Secondary | ICD-10-CM

## 2019-10-31 DIAGNOSIS — N179 Acute kidney failure, unspecified: Secondary | ICD-10-CM

## 2019-10-31 DIAGNOSIS — N1832 Chronic kidney disease, stage 3b: Secondary | ICD-10-CM

## 2019-10-31 LAB — CBC
HCT: 26.8 % — ABNORMAL LOW (ref 39.0–52.0)
Hemoglobin: 7.8 g/dL — ABNORMAL LOW (ref 13.0–17.0)
MCH: 26.7 pg (ref 26.0–34.0)
MCHC: 29.1 g/dL — ABNORMAL LOW (ref 30.0–36.0)
MCV: 91.8 fL (ref 80.0–100.0)
Platelets: 318 10*3/uL (ref 150–400)
RBC: 2.92 MIL/uL — ABNORMAL LOW (ref 4.22–5.81)
RDW: 16.1 % — ABNORMAL HIGH (ref 11.5–15.5)
WBC: 12.5 10*3/uL — ABNORMAL HIGH (ref 4.0–10.5)
nRBC: 0 % (ref 0.0–0.2)

## 2019-10-31 LAB — GLUCOSE, CAPILLARY
Glucose-Capillary: 207 mg/dL — ABNORMAL HIGH (ref 70–99)
Glucose-Capillary: 209 mg/dL — ABNORMAL HIGH (ref 70–99)
Glucose-Capillary: 212 mg/dL — ABNORMAL HIGH (ref 70–99)
Glucose-Capillary: 248 mg/dL — ABNORMAL HIGH (ref 70–99)

## 2019-10-31 LAB — BASIC METABOLIC PANEL
Anion gap: 11 (ref 5–15)
BUN: 75 mg/dL — ABNORMAL HIGH (ref 8–23)
CO2: 22 mmol/L (ref 22–32)
Calcium: 8.2 mg/dL — ABNORMAL LOW (ref 8.9–10.3)
Chloride: 118 mmol/L — ABNORMAL HIGH (ref 98–111)
Creatinine, Ser: 2.24 mg/dL — ABNORMAL HIGH (ref 0.61–1.24)
GFR calc Af Amer: 32 mL/min — ABNORMAL LOW (ref >60)
GFR calc non Af Amer: 28 mL/min — ABNORMAL LOW (ref >60)
Glucose, Bld: 242 mg/dL — ABNORMAL HIGH (ref 70–99)
Potassium: 4.7 mmol/L (ref 3.5–5.1)
Sodium: 151 mmol/L — ABNORMAL HIGH (ref 135–145)

## 2019-10-31 LAB — URINE CULTURE: Culture: NO GROWTH

## 2019-10-31 MED ORDER — INSULIN ASPART 100 UNIT/ML ~~LOC~~ SOLN
6.0000 [IU] | SUBCUTANEOUS | Status: DC
  Administered 2019-10-31 – 2019-11-02 (×11): 6 [IU] via SUBCUTANEOUS

## 2019-10-31 NOTE — Progress Notes (Signed)
Subjective: NAEs o/n.  Objective: Vital signs in last 24 hours: Temp:  [98.9 F (37.2 C)-101.1 F (38.4 C)] 99.6 F (37.6 C) (01/13 0800) Pulse Rate:  [62-107] 97 (01/13 1000) Resp:  [8-25] 13 (01/13 1000) BP: (97-137)/(48-77) 137/73 (01/13 1000) SpO2:  [91 %-100 %] 96 % (01/13 1000) FiO2 (%):  [40 %-50 %] 50 % (01/13 0809) Weight:  [119.1 kg] 119.1 kg (01/13 0500)  Intake/Output from previous day: 01/12 0701 - 01/13 0700 In: 1870.1 [I.V.:537.3; NG/GT:1066.7; IV Piggyback:266.2] Out: 900 [Urine:900] Intake/Output this shift: Total I/O In: 4274.3 [I.V.:44.3; NG/GT:4230] Out: -   Intubated, sedated. More alert today, regards and eyes remain open to voice longer.  Does not follow commands, withdraws bilateral lower extremities L > R and in UEs L > R   Lab Results: Recent Labs    10/30/19 0616 10/31/19 0531  WBC 14.3* 12.5*  HGB 8.7* 7.8*  HCT 29.5* 26.8*  PLT 434* 318   BMET Recent Labs    10/30/19 0616 10/31/19 0531  NA 156* 151*  K 4.3 4.7  CL 125* 118*  CO2 22 22  GLUCOSE 225* 242*  BUN 53* 75*  CREATININE 1.62* 2.24*  CALCIUM 8.8* 8.2*    Studies/Results: DG Chest Port 1 View  Result Date: 10/30/2019 CLINICAL DATA:  Intubation.  History of fall. EXAM: PORTABLE CHEST 1 VIEW COMPARISON:  10/29/2019. FINDINGS: Endotracheal tube and NG tube in stable position. Cardiomegaly with pulmonary venous congestion. Bibasilar atelectasis. Bibasilar infiltrates/edema cannot be excluded. Small bilateral pleural effusions. No pneumothorax. No acute bony abnormality identified. IMPRESSION: 1.  Endotracheal tube and NG tube in stable position. 2.  Cardiomegaly with pulmonary venous congestion. 3. Bibasilar atelectasis. Bibasilar infiltrates/edema cannot be excluded. Small bilateral pleural effusions noted. Mild CHF may be present. Electronically Signed   By: Marcello Moores  Register   On: 10/30/2019 06:07   DG Abd Portable 1V  Result Date: 10/30/2019 CLINICAL DATA:  Enteric tube  placement EXAM: PORTABLE ABDOMEN - 1 VIEW COMPARISON:  None. FINDINGS: Enteric tube terminates in proximal stomach with side port also in the proximal stomach. No disproportionately dilated small bowel loops. Mild colonic stool. No evidence of pneumatosis or pneumoperitoneum. Bilateral posterior spinal fusion hardware in the lower lumbar spine. No radiopaque nephrolithiasis. IMPRESSION: Well-positioned enteric tube with tip in the proximal stomach. Electronically Signed   By: Ilona Sorrel M.D.   On: 10/30/2019 12:18    Assessment/Plan: S/p evacuation of loculated SDH POD #6  SDH - continue Keppra x 2 more days - staples to be removed 1/19-1/23  Respiratory failure - did not tolerate pressure support; vent mgmt per CCM  Fever, currently afebrile - cultures pending - leukocytosis improved - holding off on antibiotics for now  Hypernatremia - improved; continue free water  DVT prophylaxis - cont Lovenox daily  Ultimately, plan will be for extubation trial if his respiratory status improves alongside neurologic improvement (following commands).  If no improvement over next 5 days, will discuss moving towards palliative care given patient's wish to avoid prolonged intubation/tracheostomy.   LOS: 7 days     Vallarie Mare 10/31/2019, 10:57 AM

## 2019-10-31 NOTE — Progress Notes (Addendum)
NAME:  Tommy Green, MRN:  [id number], DOB:  [birth year], LOS: 7 ADMISSION DATE:  10/24/2019, CONSULTATION DATE:  10/25/19 REFERRING MD:  Marcello Moores  CHIEF COMPLAINT:  Vent management   Brief History   Tommy Green is a 74 y.o. male who was taken to OR 1/7 for craniotomy and evacuation of SDH.  He was extubated in PACU prior to transfer to ICU.  History of present illness    Tommy Green is a 74 y.o. male who has a PMH as outlined below.  He suffered blunt head trauma a few weeks back and had no concerning findings on imaging.  Subsequently developed confusion, aphasia, mobility problems to the point he could no longer perform any ADLs.  He presented to [facility] ED 1/6 and CT head showed loculated AoC left SDH with mass effect.  He was transferred to [facility] for neurosurgery evaluation.  On 1/7, he was taken to OR for craniotomy and evacuation of SDH.  PCCM consulted for vent management post op; however, pt was extubated in PACU prior to transfer to ICU.  Pt was seen 10/26/2019 and Dr. Elsworth Soho signed off. I was paged to the bedside 1/9 at 1:48 pm for increasing respiratory distress and altered mental status.  Pt was added back to the PCCM list  Past Medical History  has Pain in joint, ankle and foot; Essential hypertension; Hyperlipidemia associated with type 2 diabetes mellitus ([place]); Type 2 diabetes mellitus with diabetic neuropathy, unspecified ([place]); Heart murmur; Hypertriglyceridemia; History of stroke; Skin lesion; Squamous cell carcinoma in situ (SCCIS) of skin; Granuloma annulare; Tendinopathy of right gluteus medius; Tendinopathy of left gluteus medius; Neoplasm of uncertain behavior; Bilateral hip pain; AK (actinic keratosis); Neuropathy; Cyclic vomiting syndrome; Chronic pain of right knee; SDH (subdural hematoma) ([place]); Respiratory failure ([place]); Acute encephalopathy; Hypernatremia; Dyspnea; and Acute respiratory failure with hypoxemia ([place]) on their problem list.  Significant Hospital  Events   1/7 > admit.  Consults:  PCCM   Procedures:  ETT 1/7 > 1/7, 1/11 >>>  Significant Diagnostic Tests:  CT head 1/6 > large AoC L SDH with mass effect and 5.37mm L to R MLS. CT head 1/9 > no new abnormalities  Micro Data:  SARS CoV 2 1/6 > neg Blood 1/12 > Urine 1/12 > Trach asp 1/12 >  Antimicrobials:    Interim history/subjective:  Decreased fent gtt from 185mcg/hr to 52mcg/hr  Air trapping; I time decreased to 0.7   Is moving spontaneously and meaningfully, but not following commands.    Objective:  Blood pressure 112/65, pulse 86, temperature 98.9 F (37.2 C), temperature source Axillary, resp. rate (!) 22, height 5\' 4"  (1.626 m), weight 119.1 kg, SpO2 98 %.    Vent Mode: PRVC FiO2 (%):  [40 %-50 %] 50 % Set Rate:  [16 bmp] 16 bmp Vt Set:  [470 mL] 470 mL PEEP:  [5 cmH20] 5 cmH20 Pressure Support:  [5 cmH20] 5 cmH20 Plateau Pressure:  [21 cmH20] 21 cmH20   Intake/Output Summary (Last 24 hours) at 10/31/2019 0742 Last data filed at 10/31/2019 0700 Gross per 24 hour  Intake 1870.12 ml  Output 900 ml  Net 970.12 ml   Filed Weights   10/25/19 1256 10/30/19 0500 10/31/19 0500  Weight: 118.4 kg 119.4 kg 119.1 kg    Examination:  General:  Obese older adult M intubated sedated NAD  Neuro:  RASS 0 to +1. Moving BUE BLE spontaneously. Intermittently seems to follow commands.  HEENT: Surgical site on scalp clean  dry with staples intact. Anicteric sclera. Pink mm. Tan coating on tongue. Trachea midline. ETT secure.  Cardiovascular:  RRR 123456. 3/6 systolic murmur. 2+ radial pulses. Cap refill < 3 seconds BUE BLE  Lungs:  Symmetrical chest expansion. Scattered rhonchi. No accessory muscle use.  Abdomen: soft, protuberant, normoactive x4  Musculoskeletal:  No joint deformity. Symmetrical bulk and tone. No cyanosis or clubbing  Skin:  C/d/w/i   Assessment & Plan:   Acute Encephalopathy -in setting of SDH, sedating medications, at risk ICU delirium, with BUN  increasing possible uremic component  P - wean sedation as able - SDH support per primary, as below - Closely follow BUN; AKI on CKD as below   Acute respiratory failure with hypoxemia requiring intubation  P - WUA/SBT  - Wean vent support as able -PAD with fentanyl gtt and PRN versed.  -Pulm hygiene  -Appreciate resp therapy assistance: I-time decreased to 0.7 on 1/13  -AM CXR   SIRS -leukocytosis, HR > 90, Temp > 38  -WBC downtrending to 12.5 -TMax 38.3 -PCT 0.10 P -defer abx -follow WBC, fever curve -follow up cx data when resulted   SDH s/p evacuation. - per neurosurgery - Keppra   AKI on CKD 2 24 hr Cr increase from 1.62 to 2.24 (1/13) Hypernatremia, improving  (Serum: osm 348, Na 159. Urine: Osm 610, Na 80) -Na 151 on 1/13 Hyperchloremia, improving P - Continue FWF -Trend renal indices  - Strict I/O with hourly UOP   Inadequate PO intake Dysphagia - EN per RDN   Anemia of critical illness -intreval decreased in hgb from 8.8 to 7.8 (1/13). Possible iatrogenic due to fq labs. HCT remains stable so do not think hemodilution. Does not have obvious s/sx GIB  P - trend CBC - transfuse for Hb < 7  DM II  - SSI - Lantus 12 units daily - EN coverage novolog 4 units every 4 hours - Hold home metformin, tresiba.  Suspected OSA - would benefit from CPAP if able to improve.   Hx HTN, HLD. - goal SBP < 160 -continue tele  - Hold home carvedilol, HCTZ, lisinopril, fenofibrate, lovastatin    Best Practice:  Diet: EN  DVT prophylaxis: lovenox  GI prophylaxis: protonix  Glucose: SSI +basal + enteral coverage  Mobility: Bedrest Code Status: Full  Disposition: ICU  Labs:   CMP Latest Ref Rng & Units 10/31/2019 10/30/2019 10/30/2019  Glucose 70 - 99 mg/dL 242(H) 225(H) -  BUN 8 - 23 mg/dL 75(H) 53(H) -  Creatinine 0.61 - 1.24 mg/dL 2.24(H) 1.62(H) -  Sodium 135 - 145 mmol/L 151(H) 156(H) 158(H)  Potassium 3.5 - 5.1 mmol/L 4.7 4.3 4.1  Chloride 98 - 111  mmol/L 118(H) 125(H) -  CO2 22 - 32 mmol/L 22 22 -  Calcium 8.9 - 10.3 mg/dL 8.2(L) 8.8(L) -  Total Protein 6.5 - 8.1 g/dL - - -  Total Bilirubin 0.3 - 1.2 mg/dL - - -  Alkaline Phos 38 - 126 U/L - - -  AST 15 - 41 U/L - - -  ALT 0 - 44 U/L - - -    CBC Latest Ref Rng & Units 10/31/2019 10/30/2019 10/30/2019  WBC 4.0 - 10.5 K/uL 12.5(H) 14.3(H) -  Hemoglobin 13.0 - 17.0 g/dL 7.8(L) 8.7(L) 8.8(L)  Hematocrit 39.0 - 52.0 % 26.8(L) 29.5(L) 26.0(L)  Platelets 150 - 400 K/uL 318 434(H) -    ABG    Component Value Date/Time   PHART 7.299 (L) 10/30/2019 0402  PCO2ART 45.2 10/30/2019 0402   PO2ART 72.0 (L) 10/30/2019 0402   HCO3 22.2 10/30/2019 0402   TCO2 24 10/30/2019 0402   ACIDBASEDEF 4.0 (H) 10/30/2019 0402   O2SAT 92.0 10/30/2019 0402    CBG (last 3)  Recent Labs    10/30/19 1956 10/30/19 2326 10/31/19 0339  GLUCAP 188* 245* 209*    CRITICAL CARE Performed by: Cristal Generous   Total critical care time: 40  minutes  Critical care time was exclusive of separately billable procedures and treating other patients.  Critical care was necessary to treat or prevent imminent or life-threatening deterioration.  Critical care was time spent personally by me on the following activities: development of treatment plan with patient and/or surrogate as well as nursing, discussions with consultants, evaluation of patient's response to treatment, examination of patient, obtaining history from patient or surrogate, ordering and performing treatments and interventions, ordering and review of laboratory studies, ordering and review of radiographic studies, pulse oximetry and re-evaluation of patient's condition.  Eliseo Gum MSN, AGACNP-BC [place] [id number] If no answer, [id number] 10/31/2019, 7:43 AM  Attending Note:  74 year old male s/p crani for SDH evacuation.  Overnight, no major events.  Weaning on exam this AM but not following commands.  I  reviewed CXR myself, ETT is in a good position.  Discussed with neurosurgery.  They feel patient is making some progress but not quite ready for extubation given airway protection concerns.  Patient evidently has an advanced directive so will need to get that.  As long as making progress will continue support with the vent then likely on Monday (unless something drastic occurs) will plan on discussing code status and trach/peg with family prior to extubation.  PCCM will continue to follow.  Start TF.  Replace electrolytes.  AM labs ordered.  PCCM will continue to follow.  The patient is critically ill with multiple organ systems failure and requires high complexity decision making for assessment and support, frequent evaluation and titration of therapies, application of advanced monitoring technologies and extensive interpretation of multiple databases.   Critical Care Time devoted to patient care services described in this note is  32  Minutes. This time reflects time of care of this signee Dr [name]. This critical care time does not reflect procedure time, or teaching time or supervisory time of PA/NP/Med student/Med Resident etc but could involve care discussion time.  Rush Farmer, M.D. [facility] Pulmonary/Critical Care Medicine.

## 2019-10-31 NOTE — Progress Notes (Signed)
PCCM Family Communication Note   74 yo M POD 6 evacuation of loculated SDH.  I was asked to bedside to speak with patient's wife.   I provided daily updates regarding patient's medical care including but not limited to the patient's ventilator requirements, sedation, AKI, and initial neurologic insult. The patient's wife, Tommy Green, asked thoughtful questions regarding respiratory failure, asking what happens if the patient does not progress in weaning the vent.  We talk about her earlier conversation with Dr. Marcello Moores (NSGY) regarding allowing a few more days on the vent to attempt forward progress and weaning.  We discussed a tracheostomy. All questions answered. She reveals that they have had many conversations regarding goals of care and limitations of care. Tommy Green states that the patient would be unhappy with the current level of mechanical support he is requiring. Tommy Green states that she is certain that the patient would not want a tracheostomy or other further invasive procedures. We also discuss that when the patient is extubated, he would not want to be re-intubated and desires a one-way extubation when deemed appropriate.   We discuss one-way extubation trajectories (one-way extubation without distress and continuation of other medical care, one-way extubation with subsequent distress and recommended transition to comfort care, and one-way extubation during planned comfort care measures). All questions answered. Emotional Support provided.   Plan -Continue current scope of care -Continue to try to wean sedation and wean vent as able -No further invasive procedures, will not be re-intubated  - 1-way extubation by Monday (progress vs decline in coming days will help shape our care trajectory)      Eliseo Gum MSN, AGACNP-BC [place] [id number] If no answer, [id number] 10/31/2019, 3:33 PM

## 2019-11-01 ENCOUNTER — Inpatient Hospital Stay (HOSPITAL_COMMUNITY): Payer: Medicare HMO

## 2019-11-01 DIAGNOSIS — J9600 Acute respiratory failure, unspecified whether with hypoxia or hypercapnia: Secondary | ICD-10-CM

## 2019-11-01 LAB — CBC
HCT: 26.9 % — ABNORMAL LOW (ref 39.0–52.0)
Hemoglobin: 7.8 g/dL — ABNORMAL LOW (ref 13.0–17.0)
MCH: 26.1 pg (ref 26.0–34.0)
MCHC: 29 g/dL — ABNORMAL LOW (ref 30.0–36.0)
MCV: 90 fL (ref 80.0–100.0)
Platelets: 325 10*3/uL (ref 150–400)
RBC: 2.99 MIL/uL — ABNORMAL LOW (ref 4.22–5.81)
RDW: 15.9 % — ABNORMAL HIGH (ref 11.5–15.5)
WBC: 13.2 10*3/uL — ABNORMAL HIGH (ref 4.0–10.5)
nRBC: 0 % (ref 0.0–0.2)

## 2019-11-01 LAB — BASIC METABOLIC PANEL
Anion gap: 11 (ref 5–15)
BUN: 76 mg/dL — ABNORMAL HIGH (ref 8–23)
CO2: 21 mmol/L — ABNORMAL LOW (ref 22–32)
Calcium: 8.3 mg/dL — ABNORMAL LOW (ref 8.9–10.3)
Chloride: 115 mmol/L — ABNORMAL HIGH (ref 98–111)
Creatinine, Ser: 1.79 mg/dL — ABNORMAL HIGH (ref 0.61–1.24)
GFR calc Af Amer: 42 mL/min — ABNORMAL LOW (ref >60)
GFR calc non Af Amer: 36 mL/min — ABNORMAL LOW (ref >60)
Glucose, Bld: 265 mg/dL — ABNORMAL HIGH (ref 70–99)
Potassium: 4.3 mmol/L (ref 3.5–5.1)
Sodium: 147 mmol/L — ABNORMAL HIGH (ref 135–145)

## 2019-11-01 LAB — HEPATIC FUNCTION PANEL
ALT: 23 U/L (ref 0–44)
AST: 22 U/L (ref 15–41)
Albumin: 2.3 g/dL — ABNORMAL LOW (ref 3.5–5.0)
Alkaline Phosphatase: 48 U/L (ref 38–126)
Bilirubin, Direct: 0.1 mg/dL (ref 0.0–0.2)
Indirect Bilirubin: 0.4 mg/dL (ref 0.3–0.9)
Total Bilirubin: 0.5 mg/dL (ref 0.3–1.2)
Total Protein: 5.5 g/dL — ABNORMAL LOW (ref 6.5–8.1)

## 2019-11-01 LAB — AMMONIA: Ammonia: 41 umol/L — ABNORMAL HIGH (ref 9–35)

## 2019-11-01 LAB — GLUCOSE, CAPILLARY
Glucose-Capillary: 215 mg/dL — ABNORMAL HIGH (ref 70–99)
Glucose-Capillary: 218 mg/dL — ABNORMAL HIGH (ref 70–99)
Glucose-Capillary: 246 mg/dL — ABNORMAL HIGH (ref 70–99)
Glucose-Capillary: 253 mg/dL — ABNORMAL HIGH (ref 70–99)
Glucose-Capillary: 260 mg/dL — ABNORMAL HIGH (ref 70–99)

## 2019-11-01 IMAGING — DX DG CHEST 1V PORT
1 series · 1 of 1 positions shown · non-contrast
Comparison: [DATE].

CLINICAL DATA: Stroke.

EXAM:
PORTABLE CHEST 1 VIEW

[chest ap]
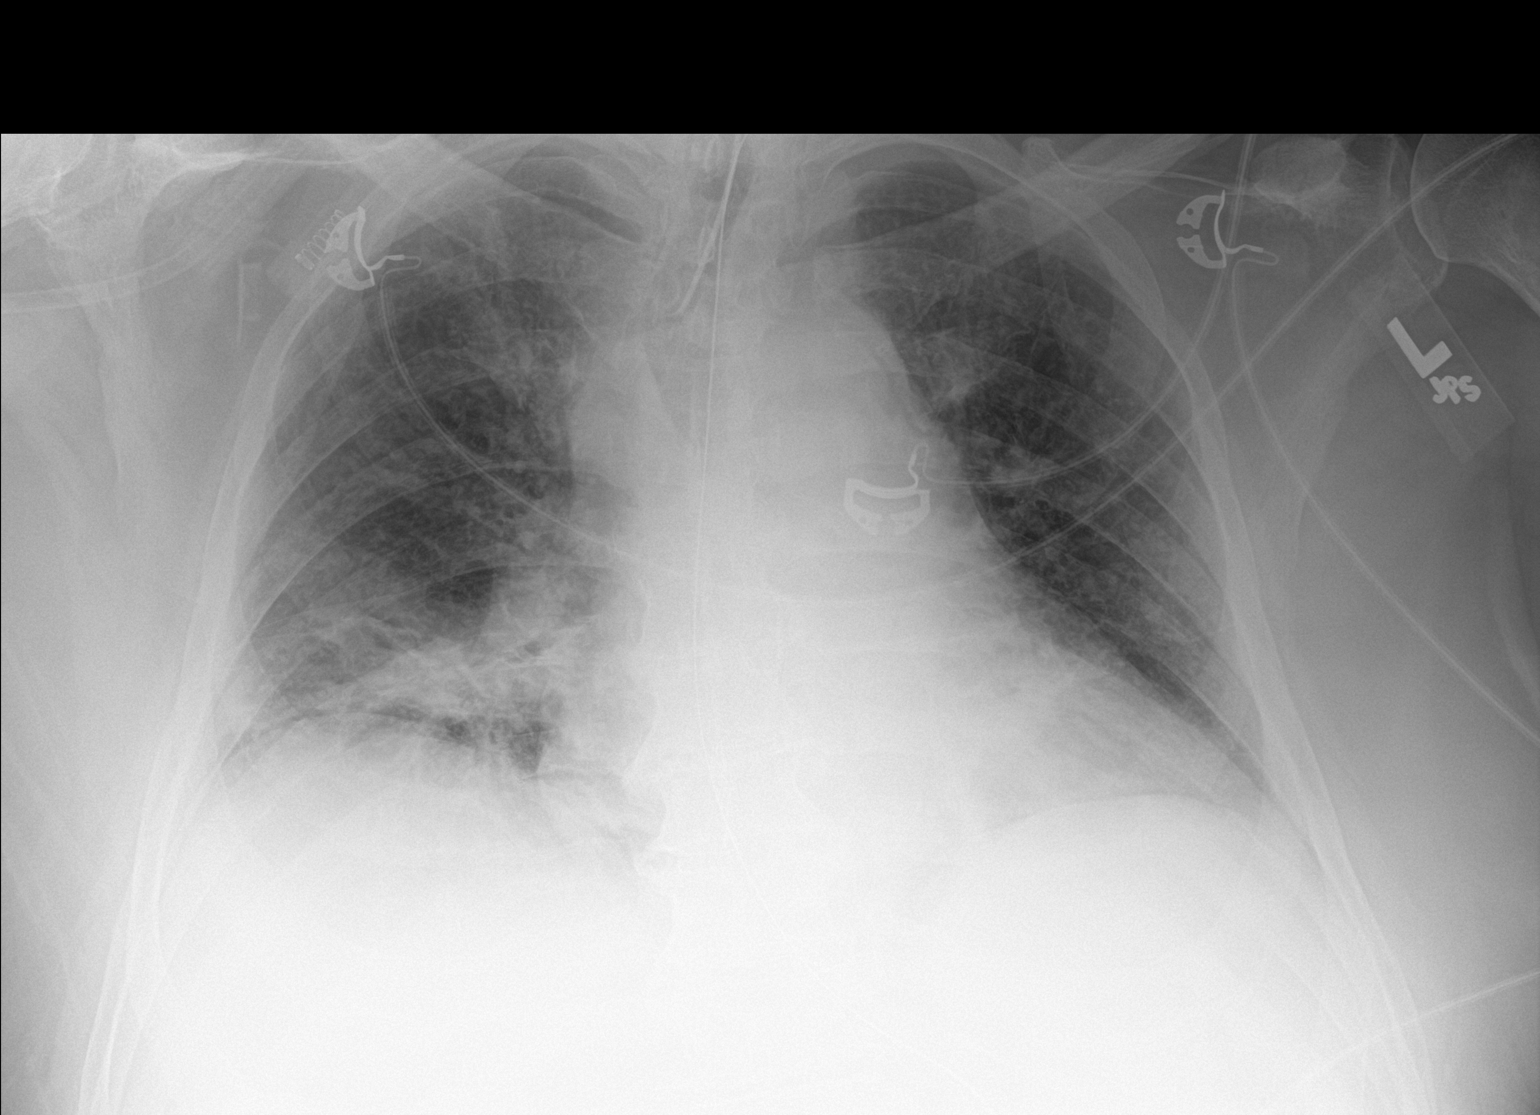

[1 of 1 positions shown; findings below may reference images not displayed]

FINDINGS: Stable cardiomegaly. Endotracheal and nasogastric tubes are
unchanged in position. No pneumothorax is noted. Left lung is clear.
Increased right basilar atelectasis or infiltrate is noted. Bony
thorax is unremarkable.
IMPRESSION: Stable support apparatus. Increased right basilar atelectasis or
infiltrate is noted compared to prior exam. No pneumothorax is
noted.

## 2019-11-01 MED ORDER — INSULIN ASPART 100 UNIT/ML ~~LOC~~ SOLN
0.0000 [IU] | SUBCUTANEOUS | Status: DC
  Administered 2019-11-01: 7 [IU] via SUBCUTANEOUS
  Administered 2019-11-01 – 2019-11-02 (×3): 11 [IU] via SUBCUTANEOUS
  Administered 2019-11-02: 4 [IU] via SUBCUTANEOUS
  Administered 2019-11-02: 17:00:00 7 [IU] via SUBCUTANEOUS
  Administered 2019-11-02: 13:00:00 4 [IU] via SUBCUTANEOUS
  Administered 2019-11-02: 11 [IU] via SUBCUTANEOUS
  Administered 2019-11-02 (×2): 7 [IU] via SUBCUTANEOUS
  Administered 2019-11-03 (×3): 4 [IU] via SUBCUTANEOUS
  Administered 2019-11-03: 12:00:00 7 [IU] via SUBCUTANEOUS
  Administered 2019-11-03 – 2019-11-04 (×2): 4 [IU] via SUBCUTANEOUS
  Administered 2019-11-04: 21:00:00 3 [IU] via SUBCUTANEOUS
  Administered 2019-11-04: 4 [IU] via SUBCUTANEOUS
  Administered 2019-11-04: 09:00:00 7 [IU] via SUBCUTANEOUS
  Administered 2019-11-04: 13:00:00 4 [IU] via SUBCUTANEOUS
  Administered 2019-11-05: 11 [IU] via SUBCUTANEOUS
  Administered 2019-11-05: 3 [IU] via SUBCUTANEOUS
  Administered 2019-11-05: 4 [IU] via SUBCUTANEOUS
  Administered 2019-11-05: 7 [IU] via SUBCUTANEOUS
  Administered 2019-11-05: 12:00:00 4 [IU] via SUBCUTANEOUS
  Administered 2019-11-05: 11 [IU] via SUBCUTANEOUS
  Administered 2019-11-05: 05:00:00 4 [IU] via SUBCUTANEOUS
  Administered 2019-11-06 (×3): 7 [IU] via SUBCUTANEOUS
  Administered 2019-11-06 (×2): 11 [IU] via SUBCUTANEOUS
  Administered 2019-11-06: 12:00:00 7 [IU] via SUBCUTANEOUS
  Administered 2019-11-07 (×2): 11 [IU] via SUBCUTANEOUS
  Administered 2019-11-07: 7 [IU] via SUBCUTANEOUS
  Administered 2019-11-07 (×2): 15 [IU] via SUBCUTANEOUS
  Administered 2019-11-08: 7 [IU] via SUBCUTANEOUS
  Administered 2019-11-08: 13:00:00 11 [IU] via SUBCUTANEOUS
  Administered 2019-11-08 (×2): 4 [IU] via SUBCUTANEOUS

## 2019-11-01 MED ORDER — LACTULOSE 10 GM/15ML PO SOLN
10.0000 g | Freq: Every day | ORAL | Status: DC
  Administered 2019-11-01 – 2019-11-03 (×3): 10 g
  Filled 2019-11-01 (×3): qty 15

## 2019-11-01 MED ORDER — SODIUM CHLORIDE 0.9 % IV SOLN
2.0000 g | INTRAVENOUS | Status: AC
  Administered 2019-11-01 – 2019-11-05 (×5): 2 g via INTRAVENOUS
  Filled 2019-11-01 (×4): qty 2
  Filled 2019-11-01: qty 20

## 2019-11-01 MED ORDER — LACTULOSE 10 GM/15ML PO SOLN
10.0000 g | Freq: Every day | ORAL | Status: DC
  Filled 2019-11-01: qty 15

## 2019-11-01 MED ORDER — INSULIN GLARGINE 100 UNIT/ML ~~LOC~~ SOLN
16.0000 [IU] | Freq: Every day | SUBCUTANEOUS | Status: DC
  Administered 2019-11-01: 22:00:00 16 [IU] via SUBCUTANEOUS
  Filled 2019-11-01 (×2): qty 0.16

## 2019-11-01 NOTE — Progress Notes (Signed)
Pharmacy Antibiotic Note  Tommy Green is a 74 y.o. male admitted on 10/24/2019 with pneumonia.  Pharmacy has been consulted for Ceftriaxone dosing. Respiratory culture from 1/12 with abundant HFlu and few serratia. Sensitivities pending. WBC is trending back up today. Low grade fever 1/12 - tmax yesterday 100.3. Blood cultures pending - no growth to date.   Plan: Ceftriaxone 2g IV every 24 hours.  Monitor clinical status and for finalized culture results.   Height: 5\' 4"  (162.6 cm) Weight: 264 lb 12.4 oz (120.1 kg) IBW/kg (Calculated) : 59.2  Temp (24hrs), Avg:99.5 F (37.5 C), Min:98.9 F (37.2 C), Max:100.3 F (37.9 C)  Recent Labs  Lab 10/28/19 0809 10/28/19 0809 10/29/19 0340 10/29/19 0523 10/29/19 1226 10/29/19 1659 10/30/19 0616 10/31/19 0531 11/01/19 0207  WBC 9.1  --  9.7  --   --   --  14.3* 12.5* 13.2*  CREATININE 1.42*   < > 1.38*   < > 1.42* 1.50* 1.62* 2.24* 1.79*   < > = values in this interval not displayed.    Estimated Creatinine Clearance: 42.2 mL/min (A) (by C-G formula based on SCr of 1.79 mg/dL (H)).    Allergies  Allergen Reactions  . Influenza Vaccines Other (See Comments)  . Aspirin     Other reaction(s): Other (See Comments) Gastroesophageal reflux Unspecified    . Atorvastatin Other (See Comments)    Myalgias GI upset Unspecified    . Canagliflozin     Other reaction(s): Other (See Comments) Pt reports that the use of Invokana caused acute kidney failure  Unspecified    . Statins     Other reaction(s): Myalgias (intolerance)    Antimicrobials this admission: Ceftriaxone 1/14 >>  Dose adjustments this admission:  Microbiology results: 1/7 MRSA PCR - negative 1/8 UCx - negative 1/12 BCx - ngtd 1/12 UC - neg 1/12 TA - few serratia, abdundant H.flu (beta lactamase+)  Thank you for allowing pharmacy to be a part of this patient's care.  Sloan Leiter, PharmD, BCPS, BCCCP Clinical Pharmacist Please refer to [facility] for [place] numbers 11/01/2019 8:15 AM

## 2019-11-01 NOTE — Progress Notes (Addendum)
NAME:  Tommy Green, MRN:  [id number], DOB:  10-18-1946, LOS: 8 ADMISSION DATE:  10/24/2019, CONSULTATION DATE:  10/25/19 REFERRING MD:  Marcello Moores  CHIEF COMPLAINT:  Vent management   Brief History   Tommy Green is a 74 y.o. male who was taken to OR 1/7 for craniotomy and evacuation of SDH.  He was extubated in PACU prior to transfer to ICU.  History of present illness    Tommy Green is a 74 y.o. male who has a PMH as outlined below.  He suffered blunt head trauma a few weeks back and had no concerning findings on imaging.  Subsequently developed confusion, aphasia, mobility problems to the point he could no longer perform any ADLs.  He presented to [facility] ED 1/6 and CT head showed loculated AoC left SDH with mass effect.  He was transferred to [facility] for neurosurgery evaluation.  On 1/7, he was taken to OR for craniotomy and evacuation of SDH.  PCCM consulted for vent management post op; however, pt was extubated in PACU prior to transfer to ICU.  Pt was seen 10/26/2019 and Dr. Elsworth Soho signed off. I was paged to the bedside 1/9 at 1:48 pm for increasing respiratory distress and altered mental status.  Pt was added back to the PCCM list  Past Medical History  has Pain in joint, ankle and foot; Essential hypertension; Hyperlipidemia associated with type 2 diabetes mellitus ([place]); Type 2 diabetes mellitus with diabetic neuropathy, unspecified ([place]); Heart murmur; Hypertriglyceridemia; History of stroke; Skin lesion; Squamous cell carcinoma in situ (SCCIS) of skin; Granuloma annulare; Tendinopathy of right gluteus medius; Tendinopathy of left gluteus medius; Neoplasm of uncertain behavior; Bilateral hip pain; AK (actinic keratosis); Neuropathy; Cyclic vomiting syndrome; Chronic pain of right knee; SDH (subdural hematoma) ([place]); Respiratory failure ([place]); Acute encephalopathy; Hypernatremia; Dyspnea; Acute respiratory failure with hypoxemia ([place]); and Acute kidney injury superimposed on chronic  kidney disease ([place]) on their problem list.  Significant Hospital Events   1/7 > admit.  Consults:  PCCM   Procedures:  ETT 1/7 > 1/7, 1/11 >>>  Significant Diagnostic Tests:  CT head 1/6 > large AoC L SDH with mass effect and 5.18mm L to R MLS. CT head 1/9 > no new abnormalities CXR 1/14> Increased R sided opacity   Micro Data:  SARS CoV 2 1/6 > neg Blood 1/12 > Urine 1/12 > Trach asp 1/12 > abundant Haemophilus infuenzae , few serratia marscescens   Antimicrobials:  1/14 ceftriaxone>   Interim history/subjective:  Fent decreased from 150 mcg to 100 mcg Tolerating SBT this morning with adequate RR and tidals, but will ultimately require resumption of PRVC due to mentation   Objective:  Blood pressure (!) 155/79, pulse 96, temperature 98.9 F (37.2 C), temperature source Axillary, resp. rate 15, height 5\' 4"  (1.626 m), weight 120.1 kg, SpO2 95 %.    Vent Mode: PRVC FiO2 (%):  [40 %-50 %] 40 % Set Rate:  [16 bmp] 16 bmp Vt Set:  [470 mL] 470 mL PEEP:  [5 cmH20] 5 cmH20 Plateau Pressure:  [11 cmH20-18 cmH20] 18 cmH20   Intake/Output Summary (Last 24 hours) at 11/01/2019 0749 Last data filed at 11/01/2019 0700 Gross per 24 hour  Intake 6822.92 ml  Output 1850 ml  Net 4972.92 ml   Filed Weights   10/30/19 0500 10/31/19 0500 11/01/19 0500  Weight: 119.4 kg 119.1 kg 120.1 kg    Examination:  General:  Obese older adult M, critically ill appearing, intubated, sedated NAD  Neuro: Opens eyes to spontaneously and to stimulation. Moves BUE BLE spontaneously. Is not following commands  HEENT: Surgical site on scalp c/d/intact staples without erythema bleeding or drainage. Anicteric sclera. ETT secure. Tan coating on tongue. Trachea midline.  Cardiovascular:  RRR s1s2. 3/6 SEM. 2+ radial pulses. Cap refill < 3 seconds BUE BLE.  Lungs:  Symmetrical chest expansion, R sided rhonchi. Thick tan secretions.  Abdomen: protuberant, soft, ndnt. + bowel sounds  Musculoskeletal: No  obvious joint deformity, no cyanosis no clubbing, symmetrical muscle bulk and tone  Skin:  C/d/w/i without rash   Assessment & Plan:   Acute Encephalopathy -in setting of SDH, sedating medications, at risk ICU delirium, with BUN increasing possible uremic component  and hepatic component  -mild hyperammonemia  -41 (1/14) P - wean sedation as able - SDH support per primary, as below - Closely follow BUN; AKI on CKD as below - lactulose    Acute respiratory failure with hypoxemia requiring intubation  H Flu PNA -tracheal aspirate with few serratia marscescens, abundant h influenza  P - WUA/SBT  - Wean vent support as able -PAD with fentanyl gtt and PRN versed.  -Pulm hygiene  -AM CXR  -abx per pharmacy for HFlu  -Trend WBC, fever curve  -Hope is to progress and merit extubation due to clinical improvement, however if inadequate progress patient/wife would favor 1-way extubation and do not want reintubation or trach   SDH s/p evacuation - per neurosurgery - Keppra   AKI on CKD 2, improving  -Cr decr from 2.24 to 1.79 (1/14) Hypernatremia, improving  (Serum: osm 348, Na 159. Urine: Osm 610, Na 80) -Na 147 (1/14)  Hyperchloremia, improving P - Continue FWF - Trend renal indices  - Strict I/O with hourly UOP   Inadequate PO intake Dysphagia - EN per RDN   Anemia of critical illness -intreval decreased in hgb from 8.8 to 7.8 (1/13). Possible iatrogenic due to fq labs. HCT remains stable so do not think hemodilution. Does not have obvious s/sx GIB  P - trend CBC - transfuse for Hb < 7  DM II with acute hyperglycemia - Increasing to rSSI  - Lantus 12 units daily - EN coverage novolog 6 units every 4 hours - Hold home metformin, tresiba.  Suspected OSA - likely would benefit from CPAP if able to improve  Hx HTN, HLD. - goal SBP < 160 -continue tele  - Hold home carvedilol, HCTZ, lisinopril, fenofibrate, lovastatin    Best Practice:  Diet: EN  DVT  prophylaxis: lovenox  GI prophylaxis: protonix  Glucose: rSSI +basal + enteral coverage  Mobility: Bedrest Code Status: Full  Disposition: ICU  Labs:   CMP Latest Ref Rng & Units 11/01/2019 10/31/2019 10/30/2019  Glucose 70 - 99 mg/dL 265(H) 242(H) 225(H)  BUN 8 - 23 mg/dL 76(H) 75(H) 53(H)  Creatinine 0.61 - 1.24 mg/dL 1.79(H) 2.24(H) 1.62(H)  Sodium 135 - 145 mmol/L 147(H) 151(H) 156(H)  Potassium 3.5 - 5.1 mmol/L 4.3 4.7 4.3  Chloride 98 - 111 mmol/L 115(H) 118(H) 125(H)  CO2 22 - 32 mmol/L 21(L) 22 22  Calcium 8.9 - 10.3 mg/dL 8.3(L) 8.2(L) 8.8(L)  Total Protein 6.5 - 8.1 g/dL 5.5(L) - -  Total Bilirubin 0.3 - 1.2 mg/dL 0.5 - -  Alkaline Phos 38 - 126 U/L 48 - -  AST 15 - 41 U/L 22 - -  ALT 0 - 44 U/L 23 - -    CBC Latest Ref Rng & Units 11/01/2019 10/31/2019 10/30/2019  WBC 4.0 - 10.5 K/uL 13.2(H) 12.5(H) 14.3(H)  Hemoglobin 13.0 - 17.0 g/dL 7.8(L) 7.8(L) 8.7(L)  Hematocrit 39.0 - 52.0 % 26.9(L) 26.8(L) 29.5(L)  Platelets 150 - 400 K/uL 325 318 434(H)    ABG    Component Value Date/Time   PHART 7.299 (L) 10/30/2019 0402   PCO2ART 45.2 10/30/2019 0402   PO2ART 72.0 (L) 10/30/2019 0402   HCO3 22.2 10/30/2019 0402   TCO2 24 10/30/2019 0402   ACIDBASEDEF 4.0 (H) 10/30/2019 0402   O2SAT 92.0 10/30/2019 0402    CBG (last 3)  Recent Labs    10/31/19 2348 11/01/19 0341 11/01/19 0720  GLUCAP 207* 246* 218*    CRITICAL CARE Performed by: Cristal Generous   Total critical care time: 35 minutes  Critical care time was exclusive of separately billable procedures and treating other patients. Critical care was necessary to treat or prevent imminent or life-threatening deterioration.  Critical care was time spent personally by me on the following activities: development of treatment plan with patient and/or surrogate as well as nursing, discussions with consultants, evaluation of patient's response to treatment, examination of patient, obtaining history from patient or  surrogate, ordering and performing treatments and interventions, ordering and review of laboratory studies, ordering and review of radiographic studies, pulse oximetry and re-evaluation of patient's condition.  Eliseo Gum MSN, AGACNP-BC [place] [id number] If no answer, [phone] 11/01/2019, 7:49 AM  Attending Note:  74 year old male s/p carni for SDH evacuation who was reintubated in the ICU for inability to protect his airway.  No events overnight.  On exam, opens eyes to voice but not following any commands and tolerating weaning with clear lungs.  I reviewed CXR myself, ETT is in a good position, mild infiltrate noted.  Discussed with PCCM-NP.  Will continue weaning efforts.  Mental status precludes extubation at this time.  Communication with family concluded no trach/peg.  If no improvement by next week will likely transition to comfort.  In the meantime, will continue correction of sodium.  Minimize sedation.  PCCM will continue to follow for now but DNR status confirmed with family.  The patient is critically ill with multiple organ systems failure and requires high complexity decision making for assessment and support, frequent evaluation and titration of therapies, application of advanced monitoring technologies and extensive interpretation of multiple databases.   Critical Care Time devoted to patient care services described in this note is  32  Minutes. This time reflects time of care of this signee Dr [name]. This critical care time does not reflect procedure time, or teaching time or supervisory time of PA/NP/Med student/Med Resident etc but could involve care discussion time.  Rush Farmer, M.D. [facility] Pulmonary/Critical Care Medicine.

## 2019-11-01 NOTE — Progress Notes (Signed)
Subjective: NAEs o/n.    Objective: Vital signs in last 24 hours: Temp:  [98.9 F (37.2 C)-100.3 F (37.9 C)] 98.9 F (37.2 C) (01/14 0700) Pulse Rate:  [70-97] 92 (01/14 0800) Resp:  [12-22] 16 (01/14 0800) BP: (95-155)/(50-79) 142/65 (01/14 0800) SpO2:  [90 %-100 %] 97 % (01/14 0800) FiO2 (%):  [40 %] 40 % (01/14 0814) Weight:  [120.1 kg] 120.1 kg (01/14 0500)  Intake/Output from previous day: 01/13 0701 - 01/14 0700 In: 6822.9 [I.V.:408.9; QP:168558; IV Piggyback:100] Out: 1850 [Urine:1850] Intake/Output this shift: Total I/O In: 21.9 [I.V.:21.9] Out: -   Intubated, sedated on Fentanyl Eyes open to voice, pupils 2 mm minimally reactive Localizes in UEs, withdraws in LEs Incision c/d/i  Lab Results: Recent Labs    10/31/19 0531 11/01/19 0207  WBC 12.5* 13.2*  HGB 7.8* 7.8*  HCT 26.8* 26.9*  PLT 318 325   BMET Recent Labs    10/31/19 0531 11/01/19 0207  NA 151* 147*  K 4.7 4.3  CL 118* 115*  CO2 22 21*  GLUCOSE 242* 265*  BUN 75* 76*  CREATININE 2.24* 1.79*  CALCIUM 8.2* 8.3*    Studies/Results: DG CHEST PORT 1 VIEW  Result Date: 11/01/2019 CLINICAL DATA:  Stroke. EXAM: PORTABLE CHEST 1 VIEW COMPARISON:  October 30, 2019. FINDINGS: Stable cardiomegaly. Endotracheal and nasogastric tubes are unchanged in position. No pneumothorax is noted. Left lung is clear. Increased right basilar atelectasis or infiltrate is noted. Bony thorax is unremarkable. IMPRESSION: Stable support apparatus. Increased right basilar atelectasis or infiltrate is noted compared to prior exam. No pneumothorax is noted. Electronically Signed   By: Marijo Conception M.D.   On: 11/01/2019 07:27   DG Abd Portable 1V  Result Date: 10/30/2019 CLINICAL DATA:  Enteric tube placement EXAM: PORTABLE ABDOMEN - 1 VIEW COMPARISON:  None. FINDINGS: Enteric tube terminates in proximal stomach with side port also in the proximal stomach. No disproportionately dilated small bowel loops. Mild colonic  stool. No evidence of pneumatosis or pneumoperitoneum. Bilateral posterior spinal fusion hardware in the lower lumbar spine. No radiopaque nephrolithiasis. IMPRESSION: Well-positioned enteric tube with tip in the proximal stomach. Electronically Signed   By: Ilona Sorrel M.D.   On: 10/30/2019 12:18    Assessment/Plan: SDH - 1 more day of Keppra for seizure prophylaxis  Respiratory failure - CXR shows infiltrate vs atelectasis in RLL.  Given slight increase in WBC count, previous fever, will start atbx.  Currently on Ceftriaxone. -- vent mgmt per CCM  DM -- Lantus + SSI.  Glucoses have been in 200s consistently  Lovenox for DVT prophylaxis      LOS: 8 days     Vallarie Mare 11/01/2019, 9:00 AM

## 2019-11-01 NOTE — Progress Notes (Signed)
Patient's wife [name] has conferred with her family and based on the patient's wishes, he would not want heroic measures in the event of his heart stopping.  As such, I have made him a DNR.  They also do not want prolonged intubation or further invasive procedures unless high probability of returning to normal quality of life.   They are okay with keeping him intubated for the time being to see if he improves, but plan would be transition to palliative care if no significant improvement in his respiratory status.

## 2019-11-02 ENCOUNTER — Inpatient Hospital Stay (HOSPITAL_COMMUNITY): Payer: Medicare HMO

## 2019-11-02 ENCOUNTER — Ambulatory Visit: Payer: Medicare HMO | Admitting: Neurology

## 2019-11-02 LAB — COMPREHENSIVE METABOLIC PANEL
ALT: 24 U/L (ref 0–44)
AST: 19 U/L (ref 15–41)
Albumin: 2.3 g/dL — ABNORMAL LOW (ref 3.5–5.0)
Alkaline Phosphatase: 53 U/L (ref 38–126)
Anion gap: 9 (ref 5–15)
BUN: 74 mg/dL — ABNORMAL HIGH (ref 8–23)
CO2: 24 mmol/L (ref 22–32)
Calcium: 8.4 mg/dL — ABNORMAL LOW (ref 8.9–10.3)
Chloride: 114 mmol/L — ABNORMAL HIGH (ref 98–111)
Creatinine, Ser: 1.72 mg/dL — ABNORMAL HIGH (ref 0.61–1.24)
GFR calc Af Amer: 44 mL/min — ABNORMAL LOW (ref >60)
GFR calc non Af Amer: 38 mL/min — ABNORMAL LOW (ref >60)
Glucose, Bld: 257 mg/dL — ABNORMAL HIGH (ref 70–99)
Potassium: 4.4 mmol/L (ref 3.5–5.1)
Sodium: 147 mmol/L — ABNORMAL HIGH (ref 135–145)
Total Bilirubin: 0.4 mg/dL (ref 0.3–1.2)
Total Protein: 5.7 g/dL — ABNORMAL LOW (ref 6.5–8.1)

## 2019-11-02 LAB — CBC
HCT: 26.4 % — ABNORMAL LOW (ref 39.0–52.0)
Hemoglobin: 7.8 g/dL — ABNORMAL LOW (ref 13.0–17.0)
MCH: 26.3 pg (ref 26.0–34.0)
MCHC: 29.5 g/dL — ABNORMAL LOW (ref 30.0–36.0)
MCV: 88.9 fL (ref 80.0–100.0)
Platelets: 328 10*3/uL (ref 150–400)
RBC: 2.97 MIL/uL — ABNORMAL LOW (ref 4.22–5.81)
RDW: 15.6 % — ABNORMAL HIGH (ref 11.5–15.5)
WBC: 15.3 10*3/uL — ABNORMAL HIGH (ref 4.0–10.5)
nRBC: 0 % (ref 0.0–0.2)

## 2019-11-02 LAB — GLUCOSE, CAPILLARY
Glucose-Capillary: 152 mg/dL — ABNORMAL HIGH (ref 70–99)
Glucose-Capillary: 181 mg/dL — ABNORMAL HIGH (ref 70–99)
Glucose-Capillary: 214 mg/dL — ABNORMAL HIGH (ref 70–99)
Glucose-Capillary: 230 mg/dL — ABNORMAL HIGH (ref 70–99)
Glucose-Capillary: 233 mg/dL — ABNORMAL HIGH (ref 70–99)
Glucose-Capillary: 259 mg/dL — ABNORMAL HIGH (ref 70–99)

## 2019-11-02 IMAGING — DX DG CHEST 1V PORT
1 series · 1 of 1 positions shown · non-contrast
Comparison: Yesterday

CLINICAL DATA: Acute respiratory failure with hypoxia

EXAM:
PORTABLE CHEST 1 VIEW

[chest]
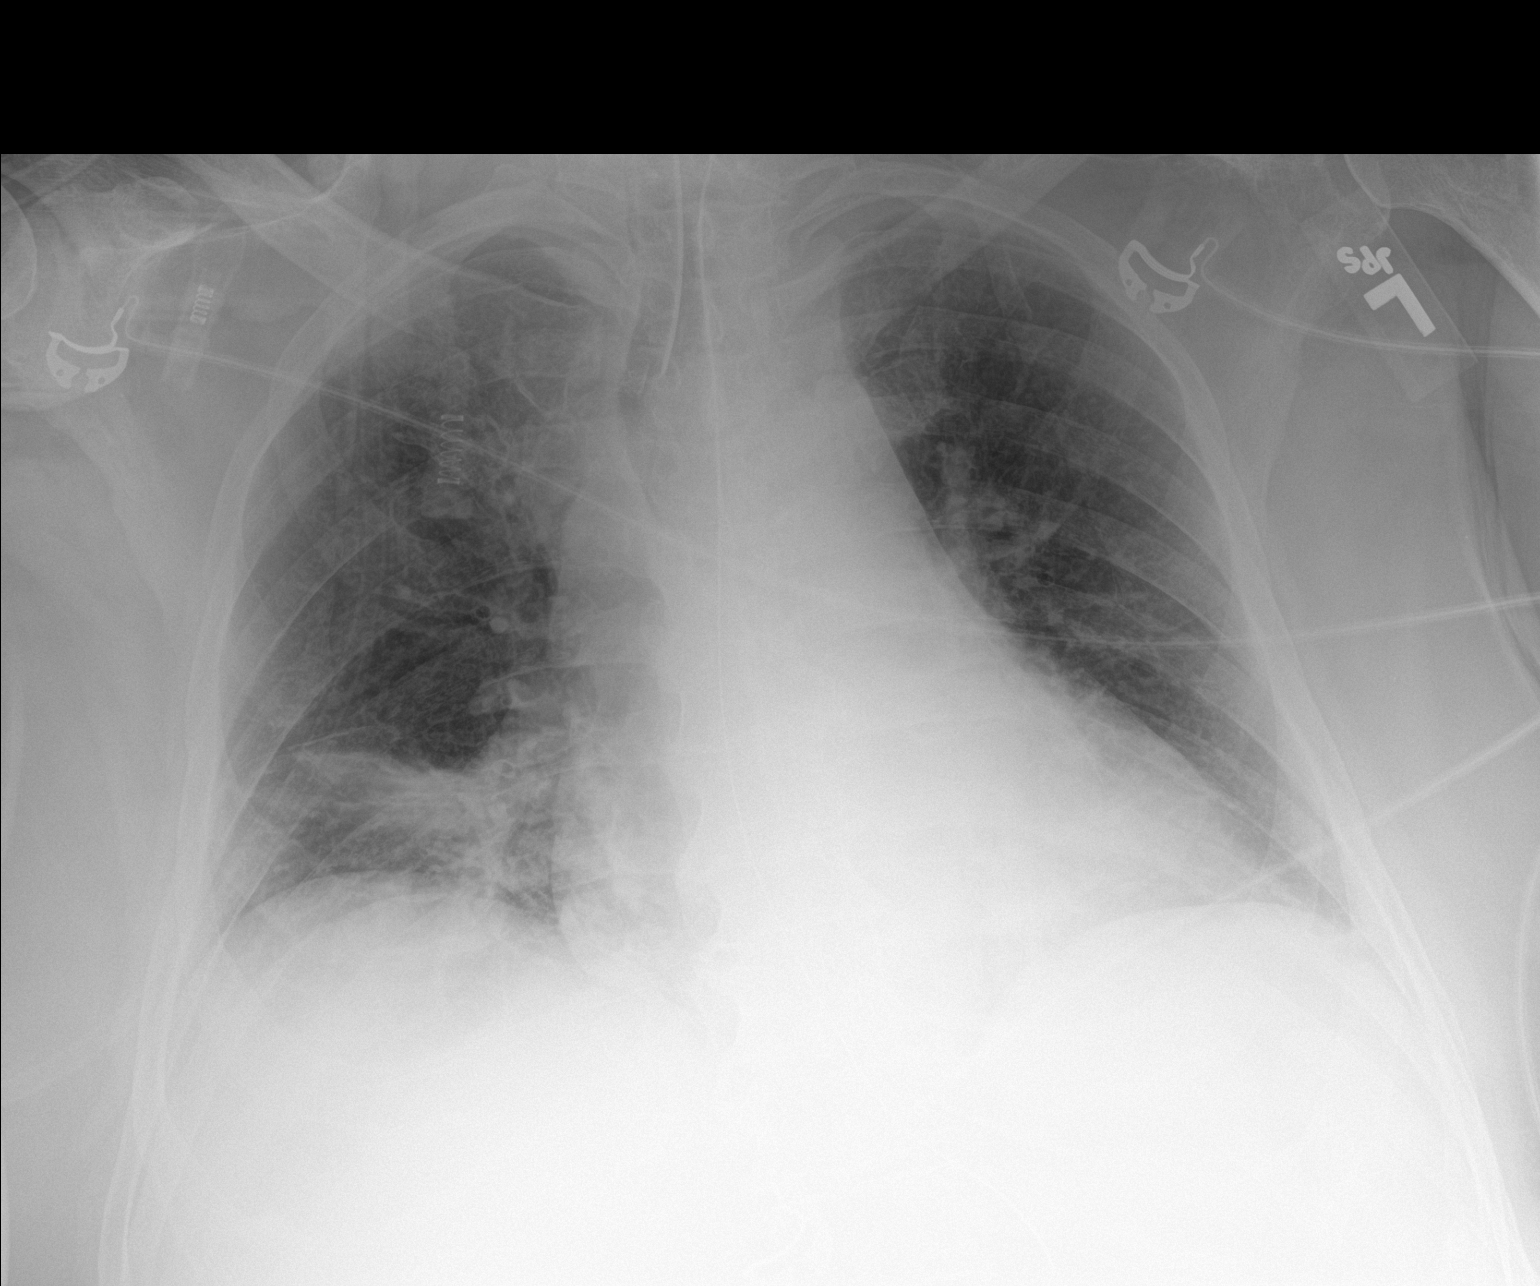

[1 of 1 positions shown; findings below may reference images not displayed]

FINDINGS: Endotracheal tube tip at the clavicular heads. The enteric tube at
least reaches the stomach.

Bilateral indistinct and streaky opacities with low lung volumes. No
pleural fluid or pneumothorax seen.

Cardiomegaly
IMPRESSION: Stable hardware positioning and bilateral pulmonary infiltrates.

## 2019-11-02 MED ORDER — SODIUM CHLORIDE 0.9 % IV SOLN
INTRAVENOUS | Status: DC | PRN
  Administered 2019-11-02: 18:00:00 500 mL via INTRAVENOUS

## 2019-11-02 MED ORDER — DEXMEDETOMIDINE HCL IN NACL 400 MCG/100ML IV SOLN
0.2000 ug/kg/h | INTRAVENOUS | Status: DC
  Administered 2019-11-02: 08:00:00 0.4 ug/kg/h via INTRAVENOUS
  Administered 2019-11-02: 22:00:00 0.6 ug/kg/h via INTRAVENOUS
  Administered 2019-11-03 (×2): 0.8 ug/kg/h via INTRAVENOUS
  Administered 2019-11-03: 09:00:00 0.5 ug/kg/h via INTRAVENOUS
  Administered 2019-11-04: 02:00:00 0.8 ug/kg/h via INTRAVENOUS
  Filled 2019-11-02 (×3): qty 100
  Filled 2019-11-02: qty 200
  Filled 2019-11-02 (×4): qty 100

## 2019-11-02 MED ORDER — INSULIN GLARGINE 100 UNIT/ML ~~LOC~~ SOLN
18.0000 [IU] | Freq: Every day | SUBCUTANEOUS | Status: DC
  Administered 2019-11-02 – 2019-11-08 (×7): 18 [IU] via SUBCUTANEOUS
  Filled 2019-11-02 (×9): qty 0.18

## 2019-11-02 MED ORDER — INSULIN ASPART 100 UNIT/ML ~~LOC~~ SOLN
10.0000 [IU] | SUBCUTANEOUS | Status: DC
  Administered 2019-11-02 – 2019-11-04 (×13): 10 [IU] via SUBCUTANEOUS

## 2019-11-02 NOTE — Procedures (Signed)
Cortrak  Person Inserting Tube:  Esaw Dace, RD Tube Type:  Cortrak - 43 inches Tube Location:  Left nare Initial Placement:  Stomach Secured by: Bridle Technique Used to Measure Tube Placement:  Documented cm marking at nare/ corner of mouth Cortrak Secured At:  71 cm Procedure Comments:  Cortrak Tube Team Note:  Consult received to place a Cortrak feeding tube.   No x-ray is required. RN may begin using tube.   If the tube becomes dislodged please keep the tube and contact the Cortrak team at [email or web address] ([email or web address]) for replacement.  If after hours and replacement cannot be delayed, place a NG tube and confirm placement with an abdominal x-ray.   [employer] MS, RDN, LDN, CNSC [phone] Pager  [phone] Weekend/On-Call Pager

## 2019-11-02 NOTE — Progress Notes (Signed)
Subjective: NAEs o/n.  Switched to CPAP this morning.  Objective: Vital signs in last 24 hours: Temp:  [98.1 F (36.7 C)-101.1 F (38.4 C)] 99.8 F (37.7 C) (01/15 0800) Pulse Rate:  [56-99] 86 (01/15 1124) Resp:  [13-23] 21 (01/15 1124) BP: (88-168)/(43-85) 134/72 (01/15 1124) SpO2:  [88 %-99 %] 97 % (01/15 1124) FiO2 (%):  [40 %] 40 % (01/15 1124) Weight:  [112.1 kg] 112.1 kg (01/15 0500)  Intake/Output from previous day: 01/14 0701 - 01/15 0700 In: 5061 [I.V.:551; NG/GT:4410; IV Piggyback:100] Out: 1995 [id number] Intake/Output this shift: No intake/output data recorded.  Incision c/d Eyes open spontaneously, appears to regard.  PERRL.  Localizing bilateral UEs but still not purposeful. LEs w/d bilaterally.  Lab Results: Recent Labs    11/01/19 0207 11/02/19 0040  WBC 13.2* 15.3*  HGB 7.8* 7.8*  HCT 26.9* 26.4*  PLT 325 328   BMET Recent Labs    11/01/19 0207 11/02/19 0040  NA 147* 147*  K 4.3 4.4  CL 115* 114*  CO2 21* 24  GLUCOSE 265* 257*  BUN 76* 74*  CREATININE 1.79* 1.72*  CALCIUM 8.3* 8.4*    Studies/Results: DG CHEST PORT 1 VIEW  Result Date: 11/02/2019 CLINICAL DATA:  Acute respiratory failure with hypoxia EXAM: PORTABLE CHEST 1 VIEW COMPARISON:  Yesterday FINDINGS: Endotracheal tube tip at the clavicular heads. The enteric tube at least reaches the stomach. Bilateral indistinct and streaky opacities with low lung volumes. No pleural fluid or pneumothorax seen. Cardiomegaly IMPRESSION: Stable hardware positioning and bilateral pulmonary infiltrates. Electronically Signed   By: Monte Fantasia M.D.   On: 11/02/2019 07:01   DG CHEST PORT 1 VIEW  Result Date: 11/01/2019 CLINICAL DATA:  Stroke. EXAM: PORTABLE CHEST 1 VIEW COMPARISON:  October 30, 2019. FINDINGS: Stable cardiomegaly. Endotracheal and nasogastric tubes are unchanged in position. No pneumothorax is noted. Left lung is clear. Increased right basilar atelectasis or infiltrate is noted.  Bony thorax is unremarkable. IMPRESSION: Stable support apparatus. Increased right basilar atelectasis or infiltrate is noted compared to prior exam. No pneumothorax is noted. Electronically Signed   By: Marijo Conception M.D.   On: 11/01/2019 07:27    Assessment/Plan:  S/p crani for evacuation of chronic SDH, remains encephalopathic postop with respiratory failure. - vent mgmt per CCM - in discussion with CCM service, will consult diabetes service for patient's difficult to control glucoses - concern for PNA-- atbx per CCM - planning for extubation trial and possible hospice Monday depending on his progress   LOS: 9 days     Vallarie Mare 11/02/2019, 12:04 PM

## 2019-11-02 NOTE — Progress Notes (Signed)
Inpatient Diabetes Program Recommendations  AACE/ADA: New Consensus Statement on Inpatient Glycemic Control (2015)  Target Ranges:  Prepandial:   less than 140 mg/dL      Peak postprandial:   less than 180 mg/dL (1-2 hours)      Critically ill patients:  140 - 180 mg/dL   Lab Results  Component Value Date   GLUCAP 233 (H) 11/02/2019   HGBA1C 7.5 (H) 10/25/2019    Review of Glycemic Control  Current orders for Inpatient glycemic control:  Lantus 16 units Novolog 0-20 units Q4 hours Novolog 6 units Q4 tube feed coverage  Vital HP 70 ml/hour BUN/creat: 74/1.72  Inpatient Diabetes Program Recommendations:    Consider Lantus 18 units.  Consider increasing Novolog Tube Feed coverage to 10 units Q4 hours.  Thanks,  Tama Headings RN, MSN, BC-ADM Inpatient Diabetes Coordinator Team Pager [phone] (8a-5p)

## 2019-11-02 NOTE — Progress Notes (Signed)
The chaplain visited after receiving a request for prayer.  The chaplain provided both prayer and a ritual for the patient that met the spiritual needs of the family.  The chaplain will follow-up with the family later.  Brion Aliment Chaplain Resident For questions concerning this note please contact me by pager [phone]

## 2019-11-02 NOTE — Progress Notes (Signed)
NAME:  Tommy Green, MRN:  [id number], DOB:  03/26/1946, LOS: 9 ADMISSION DATE:  10/24/2019, CONSULTATION DATE:  10/25/19 REFERRING MD:  Marcello Moores  CHIEF COMPLAINT:  Vent management   Brief History   Tommy Green is a 74 y.o. male who was taken to OR 1/7 for craniotomy and evacuation of SDH.  He was extubated in PACU prior to transfer to ICU.  History of present illness    Tommy Green is a 74 y.o. male who has a PMH as outlined below.  He suffered blunt head trauma a few weeks back and had no concerning findings on imaging.  Subsequently developed confusion, aphasia, mobility problems to the point he could no longer perform any ADLs.  He presented to [facility] ED 1/6 and CT head showed loculated AoC left SDH with mass effect.  He was transferred to [facility] for neurosurgery evaluation.  On 1/7, he was taken to OR for craniotomy and evacuation of SDH.  PCCM consulted for vent management post op; however, pt was extubated in PACU prior to transfer to ICU.  Pt was seen 10/26/2019 and Dr. Elsworth Soho signed off. I was paged to the bedside 1/9 at 1:48 pm for increasing respiratory distress and altered mental status.  Pt was added back to the PCCM list  Past Medical History  has Pain in joint, ankle and foot; Essential hypertension; Hyperlipidemia associated with type 2 diabetes mellitus ([place]); Type 2 diabetes mellitus with diabetic neuropathy, unspecified ([place]); Heart murmur; Hypertriglyceridemia; History of stroke; Skin lesion; Squamous cell carcinoma in situ (SCCIS) of skin; Granuloma annulare; Tendinopathy of right gluteus medius; Tendinopathy of left gluteus medius; Neoplasm of uncertain behavior; Bilateral hip pain; AK (actinic keratosis); Neuropathy; Cyclic vomiting syndrome; Chronic pain of right knee; SDH (subdural hematoma) ([place]); Respiratory failure ([place]); Acute encephalopathy; Hypernatremia; Dyspnea; Acute respiratory failure with hypoxemia ([place]); and Acute kidney injury superimposed on chronic  kidney disease ([place]) on their problem list.  Significant Hospital Events   1/7 > admit.  Consults:  PCCM   Procedures:  ETT 1/7 > 1/7, 1/11 >>>  Significant Diagnostic Tests:  CT head 1/6 > large AoC L SDH with mass effect and 5.34mm L to R MLS. CT head 1/9 > no new abnormalities CXR 1/14> Increased R sided opacity   Micro Data:  SARS CoV 2 1/6 > neg Blood 1/12 > Urine 1/12 > Trach asp 1/12 > abundant Haemophilus infuenzae , few serratia marscescens   Antimicrobials:  1/14 ceftriaxone>   Interim history/subjective:  Agitated overnight  Objective:  Blood pressure (!) 103/49, pulse (!) 56, temperature 98.8 F (37.1 C), temperature source Oral, resp. rate 17, height 5\' 4"  (1.626 m), weight 112.1 kg, SpO2 98 %.    Vent Mode: PRVC FiO2 (%):  [40 %] 40 % Set Rate:  [16 bmp] 16 bmp Vt Set:  [470 mL] 470 mL PEEP:  [5 cmH20] 5 cmH20 Pressure Support:  [10 cmH20] 10 cmH20 Plateau Pressure:  [11 cmH20-18 cmH20] 12 cmH20   Intake/Output Summary (Last 24 hours) at 11/02/2019 0715 Last data filed at 11/02/2019 0200 Gross per 24 hour  Intake 4235.41 ml  Output 1895 ml  Net 2340.41 ml   Filed Weights   10/31/19 0500 11/01/19 0500 11/02/19 0500  Weight: 119.1 kg 120.1 kg 112.1 kg    Examination:  General:  Acutely ill appearing male, NAD, on vent Neuro: Opens eyes to voice but nothing to command HEENT: Surgical site on scalp c/d/intact staples without erythema bleeding or drainage. Anicteric  sclera. ETT in place Cardiovascular:  RRR, Nl S1/s2 and -M/R/G Lungs:  CTA bilaterally Abdomen: protuberant, soft, ndnt. + bowel sounds  Musculoskeletal: No obvious joint deformity, no cyanosis no clubbing, symmetrical muscle bulk and tone  Skin:  C/d/w/i without rash   Assessment & Plan:   Acute Encephalopathy -in setting of SDH, sedating medications, at risk ICU delirium, with BUN increasing possible uremic component  and hepatic component  -mild hyperammonemia  -41 (1/14) P: -  Change sedation to precedex and limit fentanyl  - SDH support per primary, as below - Closely follow BUN; AKI on CKD as below - Lactulose and f/u ammonia level - Limit fentanyl to 100 mcg/hr - Add precedex and use as the main muscle  Acute respiratory failure with hypoxemia requiring intubation  H Flu PNA -tracheal aspirate with few serratia marscescens, abundant h influenza  P - WUA/SBT  - Maintain on PS as able, no extubation given mental status - PAD with fentanyl gtt and PRN versed.  - Pulmonary hygienes  - CXR and ABG in AM - Continue rocephin for H flu - Family does not wish for trach/peg, if no improvement to a reasonable quality of life then to comfort  SDH s/p evacuation - Per neurosurgery - Keppra   AKI on CKD 2, improving  -Cr decr from 2.24 to 1.79 (1/14) Hypernatremia, improving  (Serum: osm 348, Na 159. Urine: Osm 610, Na 80) -Na 147 (1/14)  Hyperchloremia, improving P - Continue free water - Would like to diurese but given Na will hold off for now - Trend renal indices  - Strict I/O with hourly UOP   Inadequate PO intake Dysphagia - TF per nutrition  Anemia of critical illness -intreval decreased in hgb from 8.8 to 7.8 (1/13). Possible iatrogenic due to fq labs. HCT remains stable so do not think hemodilution. Does not have obvious s/sx GIB  P - Trend CBC - Transfuse for Hb < 7  DM II with acute hyperglycemia - Increasing to rSSI  - Lantus 12 units daily - EN coverage novolog 6 units every 4 hours - Hold home metformin, tresiba.  Suspected OSA - CPAP if recovers  Hx HTN, HLD. - Goal SBP < 160 - Continue tele  - Hold home carvedilol, HCTZ, lisinopril, fenofibrate, lovastatin    Best Practice:  Diet: EN  DVT prophylaxis: lovenox  GI prophylaxis: protonix  Glucose: rSSI +basal + enteral coverage  Mobility: Bedrest Code Status: Full  Disposition: ICU  Labs:   CMP Latest Ref Rng & Units 11/02/2019 11/01/2019 10/31/2019  Glucose 70 - 99  mg/dL 257(H) 265(H) 242(H)  BUN 8 - 23 mg/dL 74(H) 76(H) 75(H)  Creatinine 0.61 - 1.24 mg/dL 1.72(H) 1.79(H) 2.24(H)  Sodium 135 - 145 mmol/L 147(H) 147(H) 151(H)  Potassium 3.5 - 5.1 mmol/L 4.4 4.3 4.7  Chloride 98 - 111 mmol/L 114(H) 115(H) 118(H)  CO2 22 - 32 mmol/L 24 21(L) 22  Calcium 8.9 - 10.3 mg/dL 8.4(L) 8.3(L) 8.2(L)  Total Protein 6.5 - 8.1 g/dL 5.7(L) 5.5(L) -  Total Bilirubin 0.3 - 1.2 mg/dL 0.4 0.5 -  Alkaline Phos 38 - 126 U/L 53 48 -  AST 15 - 41 U/L 19 22 -  ALT 0 - 44 U/L 24 23 -    CBC Latest Ref Rng & Units 11/02/2019 11/01/2019 10/31/2019  WBC 4.0 - 10.5 K/uL 15.3(H) 13.2(H) 12.5(H)  Hemoglobin 13.0 - 17.0 g/dL 7.8(L) 7.8(L) 7.8(L)  Hematocrit 39.0 - 52.0 % 26.4(L) 26.9(L) 26.8(L)  Platelets 150 -  400 K/uL 328 325 318    ABG    Component Value Date/Time   PHART 7.299 (L) 10/30/2019 0402   PCO2ART 45.2 10/30/2019 0402   PO2ART 72.0 (L) 10/30/2019 0402   HCO3 22.2 10/30/2019 0402   TCO2 24 10/30/2019 0402   ACIDBASEDEF 4.0 (H) 10/30/2019 0402   O2SAT 92.0 10/30/2019 0402    CBG (last 3)  Recent Labs    11/01/19 1957 11/01/19 2334 11/02/19 0321  GLUCAP 215* 253* 214*   The patient is critically ill with multiple organ systems failure and requires high complexity decision making for assessment and support, frequent evaluation and titration of therapies, application of advanced monitoring technologies and extensive interpretation of multiple databases.   Critical Care Time devoted to patient care services described in this note is  31  Minutes. This time reflects time of care of this signee Dr [name]. This critical care time does not reflect procedure time, or teaching time or supervisory time of PA/NP/Med student/Med Resident etc but could involve care discussion time.  Rush Farmer, M.D. [facility] Pulmonary/Critical Care Medicine.

## 2019-11-02 NOTE — Progress Notes (Signed)
Nutrition Follow-up  DOCUMENTATION CODES:   Morbid obesity  INTERVENTION:   Vital High Protein @ 70 mL/hr via OG tube.   At goal, tube feeding will provide 1680 mLs tube feeding, 1680 kcals, 147g protein, 1477m free water.  Total free water  is 3504 mL with current 3563mfree water flushes every 4 hours.   NUTRITION DIAGNOSIS:   Inadequate oral intake related to inability to eat as evidenced by NPO status. Ongoing.   GOAL:   Patient will meet greater than or equal to 90% of their needs Met.   MONITOR:   Vent status, TF tolerance  REASON FOR ASSESSMENT:   Consult Enteral/tube feeding initiation and management  ASSESSMENT:    Pt with a PMH significant for DM, HTN, HLD, CVA. Pt suffered blunt head trauma several weeks ago and subsequently developed confusion, aphasia, and mobility problems to the point of inability to perform ADLs. Pt found to have loculated AoC left SDH with mass effect requiring craniotomy and evacuation of SDH.  Pt discussed during ICU rounds and with RN.  Per MD family does not want trach/PEG would plan for comfort if no improvement.   1/7 craniotomy and evacuation of SDH, extubated in PACU, transferred to ICU 1/11 intubated   Medications reviewed and include: Colace, 35056mree water flush every 4 hours, SSI, Lantus 16 units daily, lactulose, Senokot-S  Labs reviewed: Sodium 147 (H), BUN/Cr 74/1.72 (H), CBGs 253-214-233  UOP: 1,995m12mO: +8L since admit OG tube: tip in proximal stomach  Patient is currently intubated on ventilator support MV: 12.7 L/min Temp (24hrs), Avg:99.4 F (37.4 C), Min:98.1 F (36.7 C), Max:101.1 F (38.4 C)    Diet Order:   Diet Order            Diet NPO time specified  Diet effective now              EDUCATION NEEDS:   No education needs have been identified at this time  Skin:  Skin Assessment: Reviewed RN Assessment  Last BM:  1/15  Height:   Ht Readings from Last 1 Encounters:  10/25/19  5' 4"  (1.626 m)    Weight:   Wt Readings from Last 1 Encounters:  11/02/19 112.1 kg    Ideal Body Weight:  59.09 kg  BMI:  Body mass index is 42.42 kg/m.  Estimated Nutritional Needs:   Kcal:  1700  Protein:  120-150 grams  Fluid:  1.7L  [place]iesta Acres[place], CNSC [phone] Hours Pager

## 2019-11-03 ENCOUNTER — Inpatient Hospital Stay (HOSPITAL_COMMUNITY): Payer: Medicare HMO

## 2019-11-03 LAB — GLUCOSE, CAPILLARY
Glucose-Capillary: 192 mg/dL — ABNORMAL HIGH (ref 70–99)
Glucose-Capillary: 193 mg/dL — ABNORMAL HIGH (ref 70–99)
Glucose-Capillary: 205 mg/dL — ABNORMAL HIGH (ref 70–99)
Glucose-Capillary: 167 mg/dL — ABNORMAL HIGH (ref 70–99)
Glucose-Capillary: 156 mg/dL — ABNORMAL HIGH (ref 70–99)
Glucose-Capillary: 158 mg/dL — ABNORMAL HIGH (ref 70–99)

## 2019-11-03 LAB — BASIC METABOLIC PANEL
Anion gap: 8 (ref 5–15)
Anion gap: 12 (ref 5–15)
BUN: 55 mg/dL — ABNORMAL HIGH (ref 8–23)
BUN: 50 mg/dL — ABNORMAL HIGH (ref 8–23)
CO2: 26 mmol/L (ref 22–32)
CO2: 23 mmol/L (ref 22–32)
Calcium: 8.7 mg/dL — ABNORMAL LOW (ref 8.9–10.3)
Calcium: 8.9 mg/dL (ref 8.9–10.3)
Chloride: 116 mmol/L — ABNORMAL HIGH (ref 98–111)
Chloride: 114 mmol/L — ABNORMAL HIGH (ref 98–111)
Creatinine, Ser: 1.27 mg/dL — ABNORMAL HIGH (ref 0.61–1.24)
Creatinine, Ser: 1.11 mg/dL (ref 0.61–1.24)
GFR calc Af Amer: >60 mL/min (ref >60)
GFR calc Af Amer: >60 mL/min (ref >60)
GFR calc non Af Amer: 55 mL/min — ABNORMAL LOW (ref >60)
GFR calc non Af Amer: >60 mL/min (ref >60)
Glucose, Bld: 242 mg/dL — ABNORMAL HIGH (ref 70–99)
Glucose, Bld: 178 mg/dL — ABNORMAL HIGH (ref 70–99)
Potassium: 4.2 mmol/L (ref 3.5–5.1)
Potassium: 3.8 mmol/L (ref 3.5–5.1)
Sodium: 150 mmol/L — ABNORMAL HIGH (ref 135–145)
Sodium: 149 mmol/L — ABNORMAL HIGH (ref 135–145)

## 2019-11-03 LAB — CBC
HCT: 24.1 % — ABNORMAL LOW (ref 39.0–52.0)
Hemoglobin: 7.2 g/dL — ABNORMAL LOW (ref 13.0–17.0)
MCH: 26.3 pg (ref 26.0–34.0)
MCHC: 29.9 g/dL — ABNORMAL LOW (ref 30.0–36.0)
MCV: 88 fL (ref 80.0–100.0)
Platelets: 270 10*3/uL (ref 150–400)
RBC: 2.74 MIL/uL — ABNORMAL LOW (ref 4.22–5.81)
RDW: 15.4 % (ref 11.5–15.5)
WBC: 11.5 10*3/uL — ABNORMAL HIGH (ref 4.0–10.5)
nRBC: 0 % (ref 0.0–0.2)

## 2019-11-03 LAB — POCT I-STAT 7, (LYTES, BLD GAS, ICA,H+H)
Acid-Base Excess: 1 mmol/L (ref 0.0–2.0)
Bicarbonate: 25.4 mmol/L (ref 20.0–28.0)
Calcium, Ion: 1.27 mmol/L (ref 1.15–1.40)
HCT: 21 % — ABNORMAL LOW (ref 39.0–52.0)
Hemoglobin: 7.1 g/dL — ABNORMAL LOW (ref 13.0–17.0)
O2 Saturation: 94 %
Patient temperature: 99.4
Potassium: 4.4 mmol/L (ref 3.5–5.1)
Sodium: 150 mmol/L — ABNORMAL HIGH (ref 135–145)
TCO2: 27 mmol/L (ref 22–32)
pCO2 arterial: 38.3 mmHg (ref 32.0–48.0)
pH, Arterial: 7.431 (ref 7.350–7.450)
pO2, Arterial: 71 mmHg — ABNORMAL LOW (ref 83.0–108.0)

## 2019-11-03 LAB — MAGNESIUM: Magnesium: 1.9 mg/dL (ref 1.7–2.4)

## 2019-11-03 LAB — PHOSPHORUS: Phosphorus: 2.4 mg/dL — ABNORMAL LOW (ref 2.5–4.6)

## 2019-11-03 IMAGING — DX DG CHEST 1V PORT
1 series · 1 of 1 positions shown · non-contrast
Comparison: One-view chest x-ray [DATE]

CLINICAL DATA: Endotracheal tube in place.

EXAM:
PORTABLE CHEST 1 VIEW

[chest]
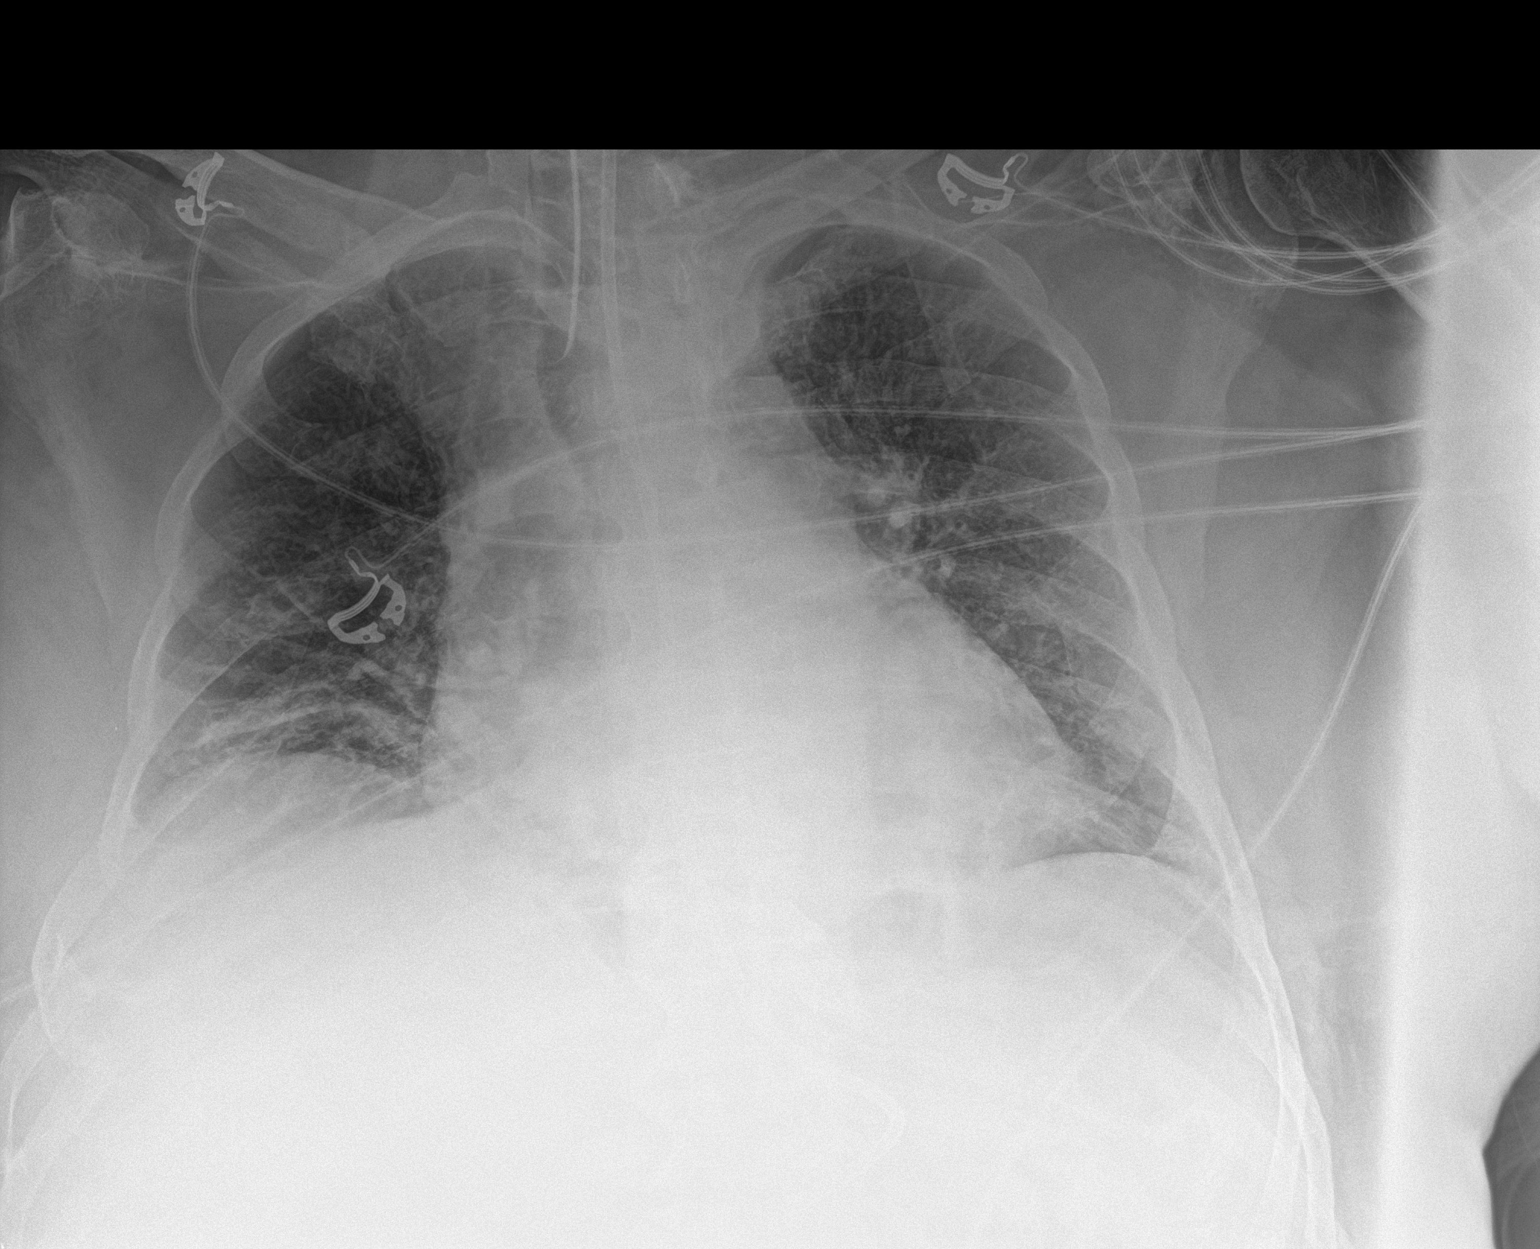

[1 of 1 positions shown; findings below may reference images not displayed]

FINDINGS: Heart size is exaggerated by low lung volumes. Endotracheal tube is
stable. NG tube was removed. Feeding tube courses off the inferior
border of the film.

Bibasilar airspace opacities are present, right greater than left.
Interstitial pattern is otherwise stable. Lung volumes and overall
aeration are similar the prior study.
IMPRESSION: 1. Interval removal of NG tube.
2. Otherwise stable appearance of the chest with low lung volumes
and bibasilar airspace disease, right greater than left.

## 2019-11-03 MED ORDER — HYDRALAZINE HCL 20 MG/ML IJ SOLN
10.0000 mg | Freq: Three times a day (TID) | INTRAMUSCULAR | Status: DC | PRN
  Administered 2019-11-03: 15:00:00 10 mg via INTRAVENOUS
  Filled 2019-11-03: qty 1

## 2019-11-03 MED ORDER — GABAPENTIN 250 MG/5ML PO SOLN
600.0000 mg | Freq: Three times a day (TID) | ORAL | Status: DC
  Administered 2019-11-03: 600 mg via ORAL
  Filled 2019-11-03 (×3): qty 12

## 2019-11-03 MED ORDER — HYDRALAZINE HCL 20 MG/ML IJ SOLN: 10.0000 mg | Freq: Three times a day (TID) | INTRAMUSCULAR | Status: DC | PRN

## 2019-11-03 MED ORDER — FREE WATER
400.0000 mL | Status: DC
  Administered 2019-11-03 – 2019-11-04 (×6): 400 mL

## 2019-11-03 MED ORDER — QUETIAPINE FUMARATE 25 MG PO TABS
25.0000 mg | ORAL_TABLET | Freq: Every day | ORAL | Status: DC
  Administered 2019-11-03: 25 mg via ORAL
  Filled 2019-11-03 (×2): qty 1

## 2019-11-03 NOTE — Progress Notes (Signed)
RT obtained ABG on pt as ordered with the following results. No changes at this time. RT will continue to monitor.  Results for RIGEL, KOYANAGI (MRN [id number]) as of 11/03/2019 04:50  Ref. Range 11/03/2019 04:26  Sample type Unknown ARTERIAL  pH, Arterial Latest Ref Range: 7.350 - 7.450  7.431  pCO2 arterial Latest Ref Range: 32.0 - 48.0 mmHg 38.3  pO2, Arterial Latest Ref Range: 83.0 - 108.0 mmHg 71.0 (L)  TCO2 Latest Ref Range: 22 - 32 mmol/L 27  Acid-Base Excess Latest Ref Range: 0.0 - 2.0 mmol/L 1.0  Bicarbonate Latest Ref Range: 20.0 - 28.0 mmol/L 25.4  O2 Saturation Latest Units: % 94.0  Patient temperature Unknown 99.4 F  Collection site Unknown BRACHIAL ARTERY

## 2019-11-03 NOTE — Progress Notes (Signed)
NAME:  Tommy Green, MRN:  [id number], DOB:  October 24, 1945, LOS: 57 ADMISSION DATE:  10/24/2019, CONSULTATION DATE:  10/25/19 REFERRING MD:  Marcello Moores  CHIEF COMPLAINT:  Vent management   Brief History   Tommy Green is a 74 y.o. male who was taken to OR 1/7 for craniotomy and evacuation of SDH.  He was extubated in PACU prior to transfer to ICU.  History of present illness    Tommy Green is a 74 y.o. male who has a PMH as outlined below.  He suffered blunt head trauma a few weeks back and had no concerning findings on imaging.  Subsequently developed confusion, aphasia, mobility problems to the point he could no longer perform any ADLs.  He presented to [facility] ED 1/6 and CT head showed loculated AoC left SDH with mass effect.  He was transferred to [facility] for neurosurgery evaluation.  On 1/7, he was taken to OR for craniotomy and evacuation of SDH.  PCCM consulted for vent management post op; however, pt was extubated in PACU prior to transfer to ICU.  Pt was seen 10/26/2019 and Dr. Elsworth Soho signed off. I was paged to the bedside 1/9 at 1:48 pm for increasing respiratory distress and altered mental status.  Pt was added back to the PCCM list  Past Medical History  has Pain in joint, ankle and foot; Essential hypertension; Hyperlipidemia associated with type 2 diabetes mellitus ([place]); Type 2 diabetes mellitus with diabetic neuropathy, unspecified ([place]); Heart murmur; Hypertriglyceridemia; History of stroke; Skin lesion; Squamous cell carcinoma in situ (SCCIS) of skin; Granuloma annulare; Tendinopathy of right gluteus medius; Tendinopathy of left gluteus medius; Neoplasm of uncertain behavior; Bilateral hip pain; AK (actinic keratosis); Neuropathy; Cyclic vomiting syndrome; Chronic pain of right knee; SDH (subdural hematoma) ([place]); Respiratory failure ([place]); Acute encephalopathy; Hypernatremia; Dyspnea; Acute respiratory failure with hypoxemia ([place]); and Acute kidney injury superimposed on chronic  kidney disease ([place]) on their problem list.  Significant Hospital Events   1/7 > admit.  Consults:  PCCM   Procedures:  ETT 1/7 > 1/7, 1/11 >>>  Significant Diagnostic Tests:  CT head 1/6 > large AoC L SDH with mass effect and 5.33mm L to R MLS. CT head 1/9 > no new abnormalities CXR 1/14> Increased R sided opacity   Micro Data:  SARS CoV 2 1/6 > neg Blood 1/12 > Urine 1/12 > Trach asp 1/12 > abundant Haemophilus infuenzae , few serratia marscescens   Antimicrobials:  1/14 ceftriaxone>   Interim history/subjective:  Remains on sedation. Encephalopathic.   Objective:  Blood pressure (!) 121/50, pulse 60, temperature 99.4 F (37.4 C), temperature source Axillary, resp. rate 20, height 5\' 4"  (1.626 m), weight 112 kg, SpO2 98 %.    Vent Mode: CPAP;PSV FiO2 (%):  [40 %] 40 % Set Rate:  [16 bmp] 16 bmp Vt Set:  [470 mL] 470 mL PEEP:  [5 cmH20] 5 cmH20 Pressure Support:  [5 cmH20] 5 cmH20 Plateau Pressure:  [12 cmH20-23 cmH20] 16 cmH20   Intake/Output Summary (Last 24 hours) at 11/03/2019 1026 Last data filed at 11/03/2019 0600 Gross per 24 hour  Intake 3274.64 ml  Output 3075 ml  Net 199.64 ml   Filed Weights   11/01/19 0500 11/02/19 0500 11/03/19 0500  Weight: 120.1 kg 112.1 kg 112 kg   Physical Exam: General: Chronically and acutely ill-appearing, no acute distress HENT: [place], AT, ETT in place, s/p surgical incision with staples, c/d/i Eyes: EOMI, no scleral icterus Respiratory: Clear to auscultation bilaterally.  No crackles, wheezing or rales Cardiovascular: RRR, -M/R/G, no JVD GI: BS+, soft, nontender Extremities:-Edema,-tenderness Neuro: Eyes open, does not follow command, moves extremities x 4 Skin: Intact, no rashes or bruising Psych: Unable to assess GU: Foley in place  CXR 11/03/19 - Improved atelectasis and airspace disease in RML  Assessment & Plan:   Acute Encephalopathy -in setting of SDH, sedating medications, at risk ICU delirium, with BUN  increasing possible uremic component  and hepatic component  -mild hyperammonemia  -41 (1/14) P: - Minimize sedating medications: Limit fentanyl (max 198mch/hr). Continue Precedex - SDH support per primary, as below - Closely follow BUN; AKI on CKD as below - Lactulose and f/u ammonia level  SDH s/p evacuation via craniotomy - Per neurosurgery - Keppra   Acute respiratory failure with hypoxemia requiring intubation  H Flu PNA P - WUA/SBT daily - PS as tolerated however extubation precluded by mental status - PAD with fentanyl gtt and PRN versed.  - Pulmonary hygiene - Continue rocephin for H flu - Family does not wish for trach/peg, if no improvement to a reasonable quality of life then to comfort  AKI on CKD 2, improving  -Cr decr from 2.24 to 1.79 (1/14) Hypernatremia (Serum: osm 348, Na 159. Urine: Osm 610, Na 80) -Na 147 (1/14)  Hyperchloremia, improving P - Increase free water - Would like to diurese but given Na will hold off for now - Trend renal indices  - Strict I/O with hourly UOP   Inadequate PO intake Dysphagia - TF per nutrition  Anemia of critical illness -intreval decreased in hgb from 8.8 to 7.8 (1/13). Possible iatrogenic due to fq labs. HCT remains stable so do not think hemodilution. Does not have obvious s/sx GIB  P - Trend CBC - Transfuse for Hb < 7  DM II with acute hyperglycemia - Increasing to rSSI  - Lantus 12 units daily - EN coverage novolog 6 units every 4 hours - Hold home metformin, tresiba.  Suspected OSA - CPAP if recovers  Hx HTN, HLD. - Goal SBP < 160 - Continue tele  - Hold home carvedilol, HCTZ, lisinopril, fenofibrate, lovastatin    Best Practice:  Diet: EN  DVT prophylaxis: lovenox  GI prophylaxis: protonix  Glucose: rSSI +basal + enteral coverage  Mobility: Bedrest Code Status: Full  Disposition: ICU  Labs:   CMP Latest Ref Rng & Units 11/03/2019 11/03/2019 11/02/2019  Glucose 70 - 99 mg/dL 242(H) - 257(H)    BUN 8 - 23 mg/dL 55(H) - 74(H)  Creatinine 0.61 - 1.24 mg/dL 1.27(H) - 1.72(H)  Sodium 135 - 145 mmol/L 150(H) 150(H) 147(H)  Potassium 3.5 - 5.1 mmol/L 4.2 4.4 4.4  Chloride 98 - 111 mmol/L 116(H) - 114(H)  CO2 22 - 32 mmol/L 26 - 24  Calcium 8.9 - 10.3 mg/dL 8.7(L) - 8.4(L)  Total Protein 6.5 - 8.1 g/dL - - 5.7(L)  Total Bilirubin 0.3 - 1.2 mg/dL - - 0.4  Alkaline Phos 38 - 126 U/L - - 53  AST 15 - 41 U/L - - 19  ALT 0 - 44 U/L - - 24    CBC Latest Ref Rng & Units 11/03/2019 11/03/2019 11/02/2019  WBC 4.0 - 10.5 K/uL 11.5(H) - 15.3(H)  Hemoglobin 13.0 - 17.0 g/dL 7.2(L) 7.1(L) 7.8(L)  Hematocrit 39.0 - 52.0 % 24.1(L) 21.0(L) 26.4(L)  Platelets 150 - 400 K/uL 270 - 328    ABG    Component Value Date/Time   PHART 7.431 11/03/2019 0426  PCO2ART 38.3 11/03/2019 0426   PO2ART 71.0 (L) 11/03/2019 0426   HCO3 25.4 11/03/2019 0426   TCO2 27 11/03/2019 0426   ACIDBASEDEF 4.0 (H) 10/30/2019 0402   O2SAT 94.0 11/03/2019 0426    CBG (last 3)  Recent Labs    11/02/19 2336 11/03/19 0336 11/03/19 0809  GLUCAP 181* 192* 193*   The patient is critically ill with multiple organ systems failure and requires high complexity decision making for assessment and support, frequent evaluation and titration of therapies, application of advanced monitoring technologies and extensive interpretation of multiple databases.   Critical Care Time devoted to patient care services described in this note is 36 Minutes.   Rodman Pickle, M.D. [facility] Pulmonary/Critical Care Medicine 11/03/2019 10:27 AM   Please see Amion for pager number to reach on-call Pulmonary and Critical Care Team.

## 2019-11-03 NOTE — Progress Notes (Signed)
Subjective: The patient is intubated and a bit agitated.   Objective: Vital signs in last 24 hours: Temp:  [97.9 F (36.6 C)-99.6 F (37.6 C)] 99.4 F (37.4 C) (01/16 0400) Pulse Rate:  [48-92] 60 (01/16 0818) Resp:  [11-30] 20 (01/16 0818) BP: (81-172)/(42-91) 121/50 (01/16 0700) SpO2:  [93 %-100 %] 98 % (01/16 0818) FiO2 (%):  [40 %] 40 % (01/16 0818) Weight:  AL:538233 kg] 112 kg (01/16 0500) Estimated body mass index is 42.38 kg/m as calculated from the following:   Height as of this encounter: 5\' 4"  (1.626 m).   Weight as of this encounter: 112 kg.   Intake/Output from previous day: 01/15 0701 - 01/16 0700 In: 3288.9 [I.V.:679.1; NG/GT:2310; IV Piggyback:299.8] Out: 3075 [Urine:3075] Intake/Output this shift: No intake/output data recorded.  Physical exam the patient is intubated and agitated.  He moves all 4 extremities.  He localizes.  His pupils are equal.  Lab Results: Recent Labs    11/02/19 0040 11/02/19 0040 11/03/19 0426 11/03/19 0522  WBC 15.3*  --   --  11.5*  HGB 7.8*   < > 7.1* 7.2*  HCT 26.4*   < > 21.0* 24.1*  PLT 328  --   --  270   < > = values in this interval not displayed.   BMET Recent Labs    11/02/19 0040 11/02/19 0040 11/03/19 0426 11/03/19 0522  NA 147*   < > 150* 150*  K 4.4   < > 4.4 4.2  CL 114*  --   --  116*  CO2 24  --   --  26  GLUCOSE 257*  --   --  242*  BUN 74*  --   --  55*  CREATININE 1.72*  --   --  1.27*  CALCIUM 8.4*  --   --  8.7*   < > = values in this interval not displayed.    Studies/Results: DG CHEST PORT 1 VIEW  Result Date: 11/02/2019 CLINICAL DATA:  Acute respiratory failure with hypoxia EXAM: PORTABLE CHEST 1 VIEW COMPARISON:  Yesterday FINDINGS: Endotracheal tube tip at the clavicular heads. The enteric tube at least reaches the stomach. Bilateral indistinct and streaky opacities with low lung volumes. No pleural fluid or pneumothorax seen. Cardiomegaly IMPRESSION: Stable hardware positioning and  bilateral pulmonary infiltrates. Electronically Signed   By: Monte Fantasia M.D.   On: 11/02/2019 07:01    Assessment/Plan: Postop day #9: The patient is neurologically stable.  LOS: 10 days     Ophelia Charter 11/03/2019, 9:12 AM

## 2019-11-03 NOTE — Progress Notes (Signed)
Updated patient's wife on the phone about patient's status. Patient is still restless, mentioned that the MD started Seroquel tonight and possibly that will help him rest. Patient's wife then mentioned that he takes a high dose of gabapentin at home for severe neuropathy in his feet that causes him to have restless leg syndrome. Patient is currently not taking the gabapentin, Adair Laundry, NP paged and notified. New order for gabapentin per tube placed and will be given tonight.

## 2019-11-04 LAB — CULTURE, BLOOD (ROUTINE X 2)
Culture: NO GROWTH
Culture: NO GROWTH
Special Requests: ADEQUATE
Special Requests: ADEQUATE

## 2019-11-04 LAB — BASIC METABOLIC PANEL
Anion gap: 9 (ref 5–15)
BUN: 56 mg/dL — ABNORMAL HIGH (ref 8–23)
CO2: 25 mmol/L (ref 22–32)
Calcium: 8.5 mg/dL — ABNORMAL LOW (ref 8.9–10.3)
Chloride: 113 mmol/L — ABNORMAL HIGH (ref 98–111)
Creatinine, Ser: 1.44 mg/dL — ABNORMAL HIGH (ref 0.61–1.24)
GFR calc Af Amer: 55 mL/min — ABNORMAL LOW (ref >60)
GFR calc non Af Amer: 47 mL/min — ABNORMAL LOW (ref >60)
Glucose, Bld: 250 mg/dL — ABNORMAL HIGH (ref 70–99)
Potassium: 3.8 mmol/L (ref 3.5–5.1)
Sodium: 147 mmol/L — ABNORMAL HIGH (ref 135–145)

## 2019-11-04 LAB — GLUCOSE, CAPILLARY
Glucose-Capillary: 101 mg/dL — ABNORMAL HIGH (ref 70–99)
Glucose-Capillary: 131 mg/dL — ABNORMAL HIGH (ref 70–99)
Glucose-Capillary: 163 mg/dL — ABNORMAL HIGH (ref 70–99)
Glucose-Capillary: 166 mg/dL — ABNORMAL HIGH (ref 70–99)
Glucose-Capillary: 173 mg/dL — ABNORMAL HIGH (ref 70–99)
Glucose-Capillary: 191 mg/dL — ABNORMAL HIGH (ref 70–99)
Glucose-Capillary: 193 mg/dL — ABNORMAL HIGH (ref 70–99)
Glucose-Capillary: 223 mg/dL — ABNORMAL HIGH (ref 70–99)
Glucose-Capillary: 255 mg/dL — ABNORMAL HIGH (ref 70–99)

## 2019-11-04 LAB — CBC
HCT: 23 % — ABNORMAL LOW (ref 39.0–52.0)
Hemoglobin: 7 g/dL — ABNORMAL LOW (ref 13.0–17.0)
MCH: 26.2 pg (ref 26.0–34.0)
MCHC: 30.4 g/dL (ref 30.0–36.0)
MCV: 86.1 fL (ref 80.0–100.0)
Platelets: 250 10*3/uL (ref 150–400)
RBC: 2.67 MIL/uL — ABNORMAL LOW (ref 4.22–5.81)
RDW: 15.7 % — ABNORMAL HIGH (ref 11.5–15.5)
WBC: 13.7 10*3/uL — ABNORMAL HIGH (ref 4.0–10.5)
nRBC: 0.1 % (ref 0.0–0.2)

## 2019-11-04 MED ORDER — GABAPENTIN 250 MG/5ML PO SOLN
600.0000 mg | Freq: Three times a day (TID) | ORAL | Status: DC
  Administered 2019-11-04 – 2019-11-07 (×9): 600 mg
  Filled 2019-11-04 (×10): qty 12

## 2019-11-04 MED ORDER — CHLORHEXIDINE GLUCONATE 0.12 % MT SOLN
15.0000 mL | Freq: Two times a day (BID) | OROMUCOSAL | Status: DC
  Administered 2019-11-05 – 2019-11-16 (×21): 15 mL via OROMUCOSAL
  Filled 2019-11-04 (×20): qty 15

## 2019-11-04 MED ORDER — FUROSEMIDE 10 MG/ML IJ SOLN
40.0000 mg | Freq: Once | INTRAMUSCULAR | Status: AC
  Administered 2019-11-04: 40 mg via INTRAVENOUS
  Filled 2019-11-04: qty 4

## 2019-11-04 MED ORDER — ORAL CARE MOUTH RINSE
15.0000 mL | Freq: Two times a day (BID) | OROMUCOSAL | Status: DC
  Administered 2019-11-04 – 2019-11-16 (×25): 15 mL via OROMUCOSAL

## 2019-11-04 NOTE — Progress Notes (Signed)
Subjective: The patient is alert and cooperative.  He is in no apparent distress.  I am told his family wants him to be reintubated if necessary.  Objective: Vital signs in last 24 hours: Temp:  [99 F (37.2 C)-99.8 F (37.7 C)] 99 F (37.2 C) (01/17 0400) Pulse Rate:  [43-90] 52 (01/17 0900) Resp:  [13-25] 19 (01/17 0900) BP: (86-168)/(45-81) 133/57 (01/17 0900) SpO2:  [94 %-100 %] 100 % (01/17 0900) FiO2 (%):  [40 %] 40 % (01/17 0846) Estimated body mass index is 42.38 kg/m as calculated from the following:   Height as of this encounter: 5\' 4"  (1.626 m).   Weight as of this encounter: 112 kg.   Intake/Output from previous day: 01/16 0701 - 01/17 0700 In: 5179.1 [I.V.:919.1; NG/GT:3960; IV Piggyback:300] Out: 1990 [Urine:1990] Intake/Output this shift: Total I/O In: 192.9 [I.V.:52.9; NG/GT:140] Out: -   Physical exam Glascow coma scale 11 intubated.  He follows commands.  His pupils are equal.  Lab Results: Recent Labs    11/03/19 0522 11/04/19 0815  WBC 11.5* 13.7*  HGB 7.2* 7.0*  HCT 24.1* 23.0*  PLT 270 250   BMET Recent Labs    11/03/19 1556 11/04/19 0815  NA 149* 147*  K 3.8 3.8  CL 114* 113*  CO2 23 25  GLUCOSE 178* 250*  BUN 50* 56*  CREATININE 1.11 1.44*  CALCIUM 8.9 8.5*    Studies/Results: DG Chest Port 1 View  Result Date: 11/03/2019 CLINICAL DATA:  Endotracheal tube in place. EXAM: PORTABLE CHEST 1 VIEW COMPARISON:  One-view chest x-ray 11/01/2019 FINDINGS: Heart size is exaggerated by low lung volumes. Endotracheal tube is stable. NG tube was removed. Feeding tube courses off the inferior border of the film. Bibasilar airspace opacities are present, right greater than left. Interstitial pattern is otherwise stable. Lung volumes and overall aeration are similar the prior study. IMPRESSION: 1. Interval removal of NG tube. 2. Otherwise stable appearance of the chest with low lung volumes and bibasilar airspace disease, right greater than left.  Electronically Signed   By: San Morelle M.D.   On: 11/03/2019 09:15    Assessment/Plan: Postop day #10: He is stable neurologically.  He is going to get an extubation trial today.  LOS: 11 days     Ophelia Charter 11/04/2019, 10:20 AM

## 2019-11-04 NOTE — Procedures (Signed)
Extubation Procedure Note  Patient Details:   Name: Tommy Green DOB: February 12, 1946 MRN: [id number]   Airway Documentation:  Airway 8 mm (Active)  Secured at (cm) 23 cm 11/04/19 0846  Measured From Lips 11/04/19 [place] 11/04/19 0846  Secured By [employer] 11/04/19 0846  Tube Holder Repositioned Yes 11/04/19 0846  Cuff Pressure (cm H2O) 30 cm H2O 11/03/19 2040  Site Condition Dry 11/04/19 0846   Vent end date: (not recorded) Vent end time: (not recorded)   Evaluation  O2 sats: stable throughout Complications: No apparent complications Patient did tolerate procedure well. Bilateral Breath Sounds: Diminished, Rhonchi   Yes   Pt placed on bipap post extubation per Dr Loanne Drilling.  Pt is tolerating well.  RT will continue to monitor.  Pierre Bali 11/04/2019, 10:58 AM

## 2019-11-04 NOTE — Progress Notes (Signed)
NAME:  Tommy Green, MRN:  [id number], DOB:  Jun 23, 1946, LOS: 49 ADMISSION DATE:  10/24/2019, CONSULTATION DATE:  10/25/19 REFERRING MD:  Marcello Moores  CHIEF COMPLAINT:  Vent management   Brief History   Tommy Green is a 74 y.o. male who was taken to OR 1/7 for craniotomy and evacuation of SDH.  He was extubated in PACU prior to transfer to ICU.  History of present illness    Tommy Green is a 74 y.o. male who has a PMH as outlined below.  He suffered blunt head trauma a few weeks back and had no concerning findings on imaging.  Subsequently developed confusion, aphasia, mobility problems to the point he could no longer perform any ADLs.  He presented to [facility] ED 1/6 and CT head showed loculated AoC left SDH with mass effect.  He was transferred to [facility] for neurosurgery evaluation.  On 1/7, he was taken to OR for craniotomy and evacuation of SDH.  PCCM consulted for vent management post op; however, pt was extubated in PACU prior to transfer to ICU.  Pt was seen 10/26/2019 and Dr. Elsworth Soho signed off. I was paged to the bedside 1/9 at 1:48 pm for increasing respiratory distress and altered mental status.  Pt was added back to the PCCM list  Past Medical History  has Pain in joint, ankle and foot; Essential hypertension; Hyperlipidemia associated with type 2 diabetes mellitus ([place]); Type 2 diabetes mellitus with diabetic neuropathy, unspecified ([place]); Heart murmur; Hypertriglyceridemia; History of stroke; Skin lesion; Squamous cell carcinoma in situ (SCCIS) of skin; Granuloma annulare; Tendinopathy of right gluteus medius; Tendinopathy of left gluteus medius; Neoplasm of uncertain behavior; Bilateral hip pain; AK (actinic keratosis); Neuropathy; Cyclic vomiting syndrome; Chronic pain of right knee; SDH (subdural hematoma) ([place]); Respiratory failure ([place]); Acute encephalopathy; Hypernatremia; Dyspnea; Acute respiratory failure with hypoxemia ([place]); and Acute kidney injury superimposed on chronic  kidney disease ([place]) on their problem list.  Significant Hospital Events   1/7 > admit. 1/17> Agitation improved after starting seroquel. Tolerating SBT Consults:  PCCM   Procedures:  ETT 1/7 > 1/7, 1/11 >>>  Significant Diagnostic Tests:  CT head 1/6 > large AoC L SDH with mass effect and 5.27mm L to R MLS. CT head 1/9 > no new abnormalities CXR 1/14> Increased R sided opacity   Micro Data:  SARS CoV 2 1/6 > neg Blood 1/12 > Urine 1/12 > Trach asp 1/12 > abundant Haemophilus infuenzae , few serratia marscescens   Antimicrobials:  1/14 ceftriaxone>   Interim history/subjective:  Started seroquel and gabapentin yesterday. Patient awake and follows commands today.  Objective:  Blood pressure (!) 86/45, pulse (!) 45, temperature 99 F (37.2 C), temperature source Oral, resp. rate (!) 21, height 5\' 4"  (1.626 m), weight 112 kg, SpO2 100 %.    Vent Mode: PRVC FiO2 (%):  [40 %] 40 % Set Rate:  [16 bmp] 16 bmp Vt Set:  [470 mL] 470 mL PEEP:  [5 cmH20] 5 cmH20 Pressure Support:  [5 cmH20] 5 cmH20 Plateau Pressure:  [13 cmH20-16 cmH20] 13 cmH20   Intake/Output Summary (Last 24 hours) at 11/04/2019 0752 Last data filed at 11/04/2019 0600 Gross per 24 hour  Intake 5179.1 ml  Output 1990 ml  Net 3189.1 ml   Filed Weights   11/01/19 0500 11/02/19 0500 11/03/19 0500  Weight: 120.1 kg 112.1 kg 112 kg   Physical Exam: General: Obese, chronically ill-appearing, no acute distress HENT: [place], AT, ETT in place Eyes: EOMI,  no scleral icterus Respiratory: Diminished breath sounds bilaterally. No crackles, wheezing or rales Cardiovascular: RRR, -M/R/G, no JVD GI: BS+, soft, nontender Extremities:-Edema,-tenderness Neuro: Opens eyes to voice, follows commands, moves extremities x 4 spontaneously Skin: Intact, no rashes or bruising GU: Foley in place  Assessment & Plan:   Acute Encephalopathy secondary to hyperactive delirium - improving -in setting of SDH, sedating medications,  at risk ICU delirium. Also elevated BUN and ammonia, possible uremic component  and hepatic component respectively P: - Minimize sedating medications: Wean off Fentanyl. Continue Precedex - Continue seroquel nightly - SDH support per primary, as below - Trend BMP - Lactulose daily  SDH s/p evacuation via craniotomy - Per neurosurgery - Keppra   Acute respiratory failure with hypoxemia requiring intubation  H Flu PNA P - WUA/SBT daily - Pressure support as tolerated. Potentially plan to extubate to BiPAP today - PAD protocol with RASS goal 0 and -1 - Pulmonary hygiene - Continue rocephin for H flu - Lasix 40 mg once  AoCKD Stage II - resolved Hypernatremia - resolving (Serum: osm 348, Na 159. Urine: Osm 610, Na 80) -Na 147 (1/14)  Hyperchloremia, improving P - Continue free water - Trend renal indices  - Strict I/O with hourly UOP   Inadequate PO intake Dysphagia - TF per nutrition  Anemia of critical illness -interval decreased in hgb from 8.8 to 7.8 (1/13). Possible iatrogenic due to fq labs. HCT remains stable so do not think hemodilution. Does not have obvious s/sx GIB  P - Trend CBC - Transfuse for Hb < 7  DM II with acute hyperglycemia - Increasing to SSI  - Lantus 12 units daily - EN coverage novolog 6 units every 4 hours - Hold home metformin, tresiba.  Suspected OSA - CPAP if recovers  Hx HTN, HLD. - Goal SBP < 160 - Continue tele  - Hold home carvedilol, HCTZ, lisinopril, fenofibrate, lovastatin   Goals of Care With patient's improving mental status, updated family. When discussing extubation plan, wife would like to re-intubate patient if needed. Code status changed from DNR to Full Code.  Best Practice:  Diet: EN  DVT prophylaxis: lovenox  GI prophylaxis: protonix  Glucose: rSSI +basal + enteral coverage  Mobility: Bedrest Code Status: Full  Disposition: ICU  Labs:   CMP Latest Ref Rng & Units 11/03/2019 11/03/2019 11/03/2019  Glucose 70  - 99 mg/dL 178(H) 242(H) -  BUN 8 - 23 mg/dL 50(H) 55(H) -  Creatinine 0.61 - 1.24 mg/dL 1.11 1.27(H) -  Sodium 135 - 145 mmol/L 149(H) 150(H) 150(H)  Potassium 3.5 - 5.1 mmol/L 3.8 4.2 4.4  Chloride 98 - 111 mmol/L 114(H) 116(H) -  CO2 22 - 32 mmol/L 23 26 -  Calcium 8.9 - 10.3 mg/dL 8.9 8.7(L) -  Total Protein 6.5 - 8.1 g/dL - - -  Total Bilirubin 0.3 - 1.2 mg/dL - - -  Alkaline Phos 38 - 126 U/L - - -  AST 15 - 41 U/L - - -  ALT 0 - 44 U/L - - -    CBC Latest Ref Rng & Units 11/03/2019 11/03/2019 11/02/2019  WBC 4.0 - 10.5 K/uL 11.5(H) - 15.3(H)  Hemoglobin 13.0 - 17.0 g/dL 7.2(L) 7.1(L) 7.8(L)  Hematocrit 39.0 - 52.0 % 24.1(L) 21.0(L) 26.4(L)  Platelets 150 - 400 K/uL 270 - 328    ABG    Component Value Date/Time   PHART 7.431 11/03/2019 0426   PCO2ART 38.3 11/03/2019 0426   PO2ART 71.0 (L) 11/03/2019 [id number]  HCO3 25.4 11/03/2019 0426   TCO2 27 11/03/2019 0426   ACIDBASEDEF 4.0 (H) 10/30/2019 0402   O2SAT 94.0 11/03/2019 0426    CBG (last 3)  Recent Labs    11/03/19 1930 11/03/19 2324 11/04/19 0358  GLUCAP 156* 158* 173*   The patient is critically ill with multiple organ systems failure and requires high complexity decision making for assessment and support, frequent evaluation and titration of therapies, application of advanced monitoring technologies and extensive interpretation of multiple databases.   Critical Care Time devoted to patient care services described in this note is 38 Minutes.   Rodman Pickle, M.D. [facility] Pulmonary/Critical Care Medicine 11/04/2019 7:52 AM   Please see Amion for pager number to reach on-call Pulmonary and Critical Care Team.

## 2019-11-05 ENCOUNTER — Inpatient Hospital Stay (HOSPITAL_COMMUNITY): Payer: Medicare HMO

## 2019-11-05 DIAGNOSIS — G4733 Obstructive sleep apnea (adult) (pediatric): Secondary | ICD-10-CM

## 2019-11-05 LAB — CBC
HCT: 26.5 % — ABNORMAL LOW (ref 39.0–52.0)
Hemoglobin: 8 g/dL — ABNORMAL LOW (ref 13.0–17.0)
MCH: 26.4 pg (ref 26.0–34.0)
MCHC: 30.2 g/dL (ref 30.0–36.0)
MCV: 87.5 fL (ref 80.0–100.0)
Platelets: 330 10*3/uL (ref 150–400)
RBC: 3.03 MIL/uL — ABNORMAL LOW (ref 4.22–5.81)
RDW: 15.7 % — ABNORMAL HIGH (ref 11.5–15.5)
WBC: 16 10*3/uL — ABNORMAL HIGH (ref 4.0–10.5)
nRBC: 0 % (ref 0.0–0.2)

## 2019-11-05 LAB — BASIC METABOLIC PANEL
Anion gap: 13 (ref 5–15)
BUN: 43 mg/dL — ABNORMAL HIGH (ref 8–23)
CO2: 24 mmol/L (ref 22–32)
Calcium: 8.9 mg/dL (ref 8.9–10.3)
Chloride: 111 mmol/L (ref 98–111)
Creatinine, Ser: 1.21 mg/dL (ref 0.61–1.24)
GFR calc Af Amer: >60 mL/min (ref >60)
GFR calc non Af Amer: 58 mL/min — ABNORMAL LOW (ref >60)
Glucose, Bld: 178 mg/dL — ABNORMAL HIGH (ref 70–99)
Potassium: 3.6 mmol/L (ref 3.5–5.1)
Sodium: 148 mmol/L — ABNORMAL HIGH (ref 135–145)

## 2019-11-05 LAB — GLUCOSE, CAPILLARY
Glucose-Capillary: 141 mg/dL — ABNORMAL HIGH (ref 70–99)
Glucose-Capillary: 170 mg/dL — ABNORMAL HIGH (ref 70–99)
Glucose-Capillary: 190 mg/dL — ABNORMAL HIGH (ref 70–99)
Glucose-Capillary: 252 mg/dL — ABNORMAL HIGH (ref 70–99)
Glucose-Capillary: 253 mg/dL — ABNORMAL HIGH (ref 70–99)
Glucose-Capillary: 273 mg/dL — ABNORMAL HIGH (ref 70–99)

## 2019-11-05 LAB — CULTURE, RESPIRATORY W GRAM STAIN

## 2019-11-05 IMAGING — DX DG CHEST 1V PORT
1 series · 1 of 1 positions shown · non-contrast
Comparison: [DATE].

CLINICAL DATA: Hypoxia.

EXAM:
PORTABLE CHEST 1 VIEW

[chest]
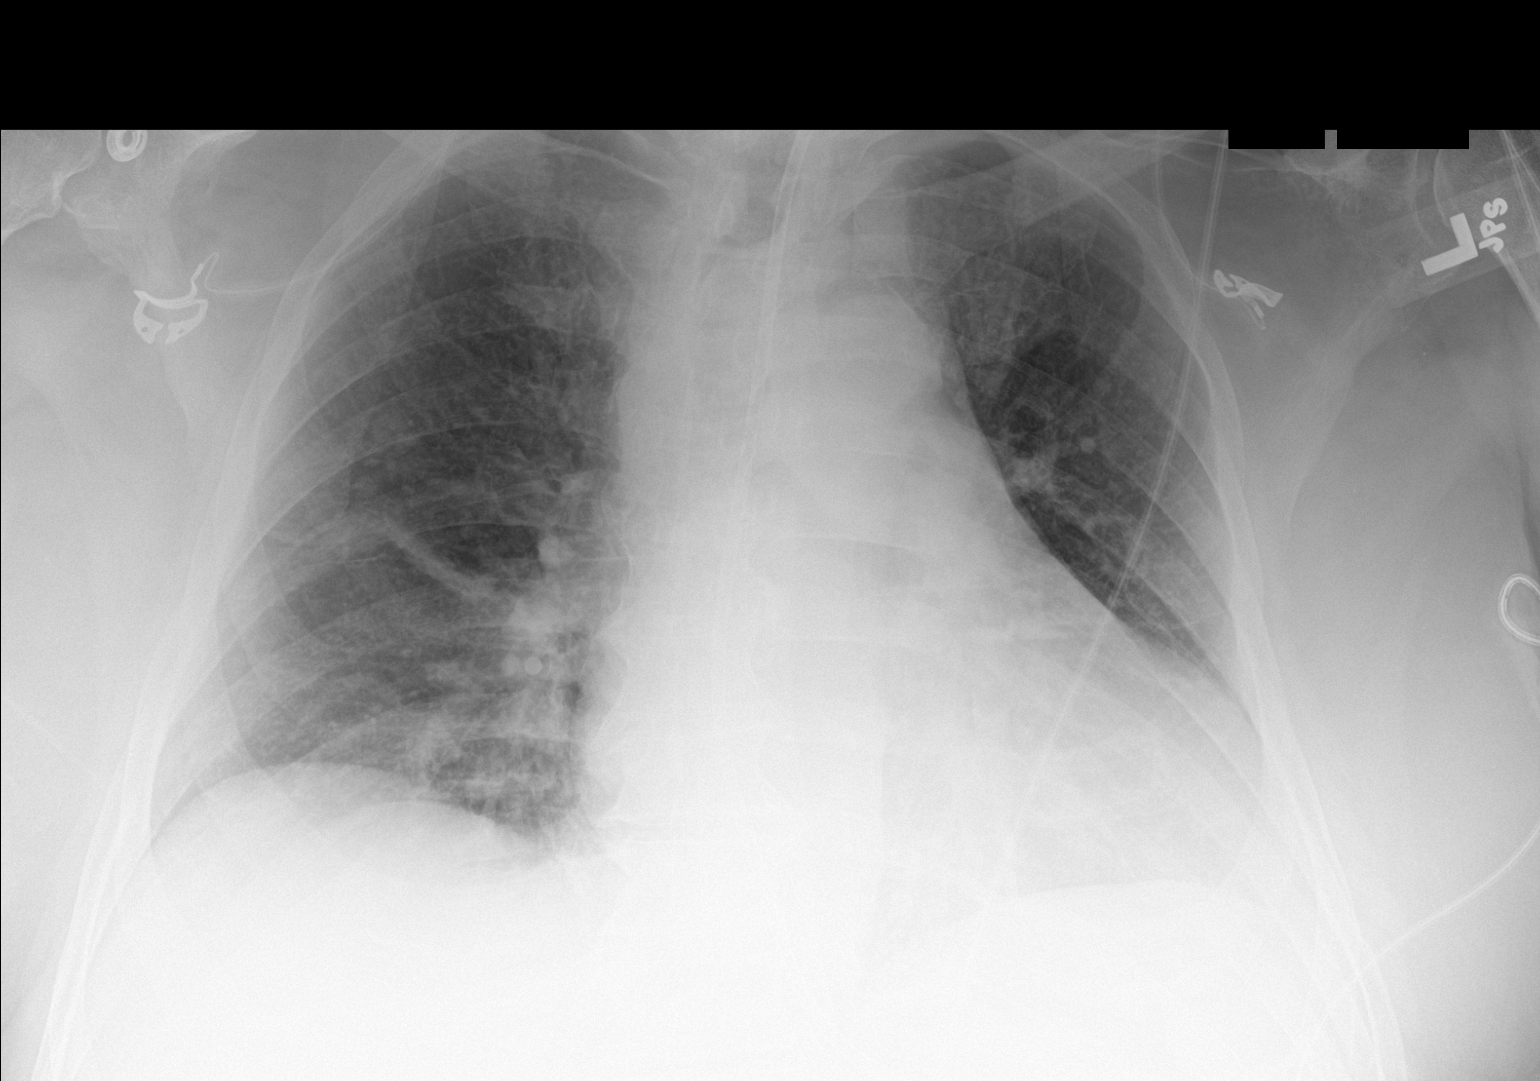

[1 of 1 positions shown; findings below may reference images not displayed]

FINDINGS: Stable cardiomegaly. No pneumothorax pleural effusion is noted. Mild
right basilar atelectasis or infiltrate is noted. Left retrocardiac
opacity can not be excluded. Feeding tube is seen entering stomach.
Bony thorax is unremarkable.
IMPRESSION: Mild right basilar atelectasis or infiltrate is noted. Left
retrocardiac opacity can not be excluded.

## 2019-11-05 MED ORDER — FUROSEMIDE 10 MG/ML IJ SOLN
40.0000 mg | Freq: Once | INTRAMUSCULAR | Status: AC
  Administered 2019-11-05: 10:00:00 40 mg via INTRAVENOUS
  Filled 2019-11-05: qty 4

## 2019-11-05 MED ORDER — QUETIAPINE FUMARATE 25 MG PO TABS
25.0000 mg | ORAL_TABLET | Freq: Every day | ORAL | Status: DC
  Administered 2019-11-05 – 2019-11-06 (×3): 25 mg
  Filled 2019-11-05 (×2): qty 1

## 2019-11-05 MED ORDER — FREE WATER
300.0000 mL | Status: DC
  Administered 2019-11-05 – 2019-11-07 (×14): 300 mL

## 2019-11-05 NOTE — Progress Notes (Addendum)
NAME:  Tommy Green, MRN:  [id number], DOB:  August 18, 1946, LOS: 12 ADMISSION DATE:  10/24/2019, CONSULTATION DATE:  10/25/19 REFERRING MD:  Marcello Moores  CHIEF COMPLAINT:  Vent management   Brief History   Tommy Green is a 74 y.o. male who was taken to OR 1/7 for craniotomy and evacuation of SDH.  He was extubated in PACU prior to transfer to ICU.  History of present illness    Tommy Green is a 74 y.o. male who has a PMH as outlined below.  He suffered blunt head trauma a few weeks back and had no concerning findings on imaging.  Subsequently developed confusion, aphasia, mobility problems to the point he could no longer perform any ADLs.  He presented to [facility] ED 1/6 and CT head showed loculated AoC left SDH with mass effect.  He was transferred to [facility] for neurosurgery evaluation.  On 1/7, he was taken to OR for craniotomy and evacuation of SDH.  PCCM consulted for vent management post op; however, pt was extubated in PACU prior to transfer to ICU.  Pt was seen 10/26/2019 and Dr. Elsworth Soho signed off. I was paged to the bedside 1/9 at 1:48 pm for increasing respiratory distress and altered mental status.  Pt was added back to the PCCM list  Past Medical History  has Pain in joint, ankle and foot; Essential hypertension; Hyperlipidemia associated with type 2 diabetes mellitus ([place]); Type 2 diabetes mellitus with diabetic neuropathy, unspecified ([place]); Heart murmur; Hypertriglyceridemia; History of stroke; Skin lesion; Squamous cell carcinoma in situ (SCCIS) of skin; Granuloma annulare; Tendinopathy of right gluteus medius; Tendinopathy of left gluteus medius; Neoplasm of uncertain behavior; Bilateral hip pain; AK (actinic keratosis); Neuropathy; Cyclic vomiting syndrome; Chronic pain of right knee; SDH (subdural hematoma) ([place]); Respiratory failure ([place]); Acute encephalopathy; Hypernatremia; Dyspnea; Acute respiratory failure with hypoxemia ([place]); and Acute kidney injury superimposed on chronic  kidney disease ([place]) on their problem list.  Significant Hospital Events   1/7 > admit. 1/17> Agitation improved after starting seroquel. Tolerating SBT Consults:  PCCM   Procedures:  ETT 1/7 > 1/7, 1/11 >>>  Significant Diagnostic Tests:  CT head 1/6 > large AoC L SDH with mass effect and 5.10mm L to R MLS. CT head 1/9 > no new abnormalities CXR 1/14> Increased R sided opacity   Micro Data:  SARS CoV 2 1/6 > neg Blood 1/12 > Urine 1/12 > Trach asp 1/12 > abundant Haemophilus infuenzae , few serratia marscescens   Antimicrobials:  1/14 ceftriaxone>   Interim history/subjective:  Extubated yesterday. Tolerated BiPAP post-extubation and currently on RA this morning  Objective:  Blood pressure 138/62, pulse 71, temperature 98.8 F (37.1 C), temperature source Oral, resp. rate 19, height 5\' 4"  (1.626 m), weight 112 kg, SpO2 94 %.    Vent Mode: BIPAP FiO2 (%):  [40 %-50 %] 50 % Set Rate:  [15 bmp] 15 bmp PEEP:  [5 cmH20] 5 cmH20 Pressure Support:  [5 cmH20] 5 cmH20   Intake/Output Summary (Last 24 hours) at 11/05/2019 0844 Last data filed at 11/05/2019 0700 Gross per 24 hour  Intake 809.18 ml  Output 3825 ml  Net -3015.82 ml   Filed Weights   11/01/19 0500 11/02/19 0500 11/03/19 0500  Weight: 120.1 kg 112.1 kg 112 kg   Physical Exam: General: Obese, chronically ill-appearing, no acute distress HENT: [place], AT, OP clear, MMM Eyes: EOMI, no scleral icterus Respiratory: Clear to auscultation bilaterally.  No crackles, wheezing or rales Cardiovascular: RRR, -M/R/G,  no JVD GI: BS+, soft, nontender Extremities:-Edema,-tenderness Neuro: AAO x4, CNII-XII grossly intact, moves extremities x 4 Skin: Intact, no rashes or bruising Psych: Normal mood, normal affect GU: Foley in place  Assessment & Plan:   Acute Encephalopathy secondary to hyperactive delirium - resolved -in setting of SDH, sedating medications, at risk ICU delirium. Also elevated BUN and ammonia, possible  uremic component  and hepatic component respectively P: - Off Precedex - Continue seroquel nightly - SDH support per primary, as below - Trend BMP - DC Lactulose daily  SDH s/p evacuation via craniotomy - Per neurosurgery - Keppra   Acute respiratory failure with hypoxemia requiring intubation  H Flu PNA Suspected OSA P - Extubated yesterday - BiPAP nightly - Complete rocephin x 5 days for H flu - Repeat Lasix 40 mg  - Swallow eval  AoCKD Stage II - resolved Hypernatremia - stable Hyperchloremia, improving P - Encourage PO intake if passes swallow - Trend renal indices  - Strict I/O with hourly UOP   Inadequate PO intake Dysphagia - Swallow eval  Anemia of critical illness -interval decreased in hgb from 8.8 to 7.8 (1/13). Possible iatrogenic due to fq labs. HCT remains stable so do not think hemodilution. Does not have obvious s/sx GIB  P - Trend CBC - Transfuse for Hb < 7  DM II - SSI  - Lantus 18 units daily - Hold home metformin, tresiba.  Hx HTN, HLD. - Goal SBP < 160 - Continue tele  - Hold home carvedilol, HCTZ, lisinopril, fenofibrate, lovastatin   Patient assessed and no critical care needs at this time. Pulmonary team will sign off. Will be available as needed. Please call for questions or concerns.  Best Practice:  Diet: EN  DVT prophylaxis: lovenox  GI prophylaxis: protonix  Glucose: rSSI +basal + enteral coverage  Mobility: Bedrest Code Status: Full  Disposition: Will transfer to Med/Surg floor if ok with primary team  Labs:   CMP Latest Ref Rng & Units 11/05/2019 11/04/2019 11/03/2019  Glucose 70 - 99 mg/dL 178(H) 250(H) 178(H)  BUN 8 - 23 mg/dL 43(H) 56(H) 50(H)  Creatinine 0.61 - 1.24 mg/dL 1.21 1.44(H) 1.11  Sodium 135 - 145 mmol/L 148(H) 147(H) 149(H)  Potassium 3.5 - 5.1 mmol/L 3.6 3.8 3.8  Chloride 98 - 111 mmol/L 111 113(H) 114(H)  CO2 22 - 32 mmol/L 24 25 23   Calcium 8.9 - 10.3 mg/dL 8.9 8.5(L) 8.9  Total Protein 6.5 - 8.1  g/dL - - -  Total Bilirubin 0.3 - 1.2 mg/dL - - -  Alkaline Phos 38 - 126 U/L - - -  AST 15 - 41 U/L - - -  ALT 0 - 44 U/L - - -    CBC Latest Ref Rng & Units 11/05/2019 11/04/2019 11/03/2019  WBC 4.0 - 10.5 K/uL 16.0(H) 13.7(H) 11.5(H)  Hemoglobin 13.0 - 17.0 g/dL 8.0(L) 7.0(L) 7.2(L)  Hematocrit 39.0 - 52.0 % 26.5(L) 23.0(L) 24.1(L)  Platelets 150 - 400 K/uL 330 250 270    ABG    Component Value Date/Time   PHART 7.431 11/03/2019 0426   PCO2ART 38.3 11/03/2019 0426   PO2ART 71.0 (L) 11/03/2019 0426   HCO3 25.4 11/03/2019 0426   TCO2 27 11/03/2019 0426   ACIDBASEDEF 4.0 (H) 10/30/2019 0402   O2SAT 94.0 11/03/2019 0426    CBG (last 3)  Recent Labs    11/04/19 2342 11/05/19 0357 11/05/19 0759  GLUCAP 166* 170* 141*   Care Time devoted to patient care services described  in this note is 40 Minutes.   Rodman Pickle, M.D. [facility] Pulmonary/Critical Care Medicine 11/05/2019 8:44 AM   Please see Amion for pager number to reach on-call Pulmonary and Critical Care Team.
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
## 2019-11-05 NOTE — Evaluation (Signed)
Occupational Therapy Evaluation Patient Details Name: Tommy Green MRN: [id number] DOB: January 26, 1946 Today's Date: 11/05/2019    History of Present Illness 74 yo male admitted with slurred speech and weakness and recent falls. chronic L SDH with mass effect (had blunt force trauma a few weeks PTA); craniotomy 10/25/19, intubated 1/11-extubated 11/04/19 PMH CVA DM with significant bil LE neuropathy HLD HTN CKD   Clinical Impression   Pt PTA: Pt living with spouse and independent. Pt currently limited by complex hospital stay and multiple re-intubations. Pt currently limited by poor strength, poor mobility, decreased activity tolerance, and decreased ability to care for self. Pt modA+2 for bed mobility and squat pivot transfers. Pt maxA overall for ADL. Pt with decreased arousal to hold focus long enough to complete ADL tasks. Pt unable to perform scanning tasks. RUE weakness greater than LUE weakness. Pt would greatly benefit from continued OT skilled services for ADL, mobility and safety in SNF setting. OT following acutely.   BP 152/73 in sittng; 139/73 after exertion in sitting.   Follow Up Recommendations  SNF;Supervision/Assistance - 24 hour    Equipment Recommendations  Other (comment)(to be determined at venue)    Recommendations for Other Services       Precautions / Restrictions Precautions Precautions: Fall Restrictions Weight Bearing Restrictions: No      Mobility Bed Mobility Overal bed mobility: Needs Assistance Bed Mobility: Supine to Sit     Supine to sit: Mod assist;+2 for physical assistance;HOB elevated     General bed mobility comments: multimodal cues to sit upright; pt leans to R  Transfers Overall transfer level: Needs assistance Equipment used: Rolling walker (2 wheeled);2 person hand held assist Transfers: Sit to/from [facility] Sit to Stand: Mod assist;+2 physical assistance;From elevated surface Stand pivot transfers: Mod  assist;+2 physical assistance;From elevated surface       General transfer comment: mod assist +2 for walker management and boost to stand (never fully extending hips/knees however able to stand ~2 minutes for pericare); max cues for sequencing with RW with rt hand poor ability to grasp handle; for pivot removed RW and 2 person assist from in front of pt with max support to rt knee due to buckling    Balance Overall balance assessment: Needs assistance Sitting-balance support: Bilateral upper extremity supported;Feet supported Sitting balance-Leahy Scale: Poor   Postural control: Right lateral lean;Posterior lean Standing balance support: Bilateral upper extremity supported;During functional activity Standing balance-Leahy Scale: Poor Standing balance comment: reliant on BUE on RW                           ADL either performed or assessed with clinical judgement   ADL Overall ADL's : Needs assistance/impaired Eating/Feeding: NPO   Grooming: Minimal assistance;Sitting   Upper Body Bathing: Moderate assistance;Sitting   Lower Body Bathing: Maximal assistance;Sitting/lateral leans;+2 for physical assistance;+2 for safety/equipment;Sit to/from stand Lower Body Bathing Details (indicate cue type and reason): Assist in standing +2 after BM in bed; pt standing for task, but unable to assist Upper Body Dressing : Moderate assistance;Sitting   Lower Body Dressing: Maximal assistance;+2 for physical assistance;+2 for safety/equipment;Cueing for safety;Sitting/lateral leans;Sit to/from stand   Toilet Transfer: Moderate assistance;Squat-pivot;Cueing for safety;Cueing for sequencing Toilet Transfer Details (indicate cue type and reason): took a few steps from bed to recliner Toileting- Clothing Manipulation and Hygiene: Maximal assistance;Sit to/from stand       Functional mobility during ADLs: Moderate assistance;+2 for physical assistance;+2 for safety/equipment;Rolling  walker;Cueing for safety;Cueing for sequencing General ADL Comments: Pt limited by poor strength, poor mobility, decreased activity tolerance, and decreased ability to care for self.     Vision Baseline Vision/History: No visual deficits Vision Assessment?: Vision impaired- to be further tested in functional context Additional Comments: Unable to test due to decreased attention span     Perception     Praxis      Pertinent Vitals/Pain Pain Assessment: No/denies pain Faces Pain Scale: No hurt     Hand Dominance Right   Extremity/Trunk Assessment Upper Extremity Assessment Upper Extremity Assessment: Generalized weakness;RUE deficits/detail;LUE deficits/detail RUE Deficits / Details: decreased AROM, poor grip strength and poor coordination RUE Coordination: decreased fine motor;decreased gross motor LUE Deficits / Details: Pt with AROM, WFLs; grip strength is fair LUE Coordination: decreased gross motor   Lower Extremity Assessment Lower Extremity Assessment: Defer to PT evaluation   Cervical / Trunk Assessment Cervical / Trunk Assessment: Other exceptions Cervical / Trunk Exceptions: obese   Communication Communication Communication: Expressive difficulties   Cognition Arousal/Alertness: Lethargic Behavior During Therapy: Flat affect Overall Cognitive Status: Impaired/Different from baseline Area of Impairment: Attention;Memory;Following commands;Safety/judgement;Awareness;Problem solving;Orientation                 Orientation Level: Disoriented to;Situation(Time NT; initially stated in hospital for back problem;) Current Attention Level: Sustained Memory: Decreased short-term memory;Decreased recall of precautions Following Commands: Follows one step commands inconsistently;Follows one step commands with increased time Safety/Judgement: Decreased awareness of safety;Decreased awareness of deficits Awareness: Intellectual Problem Solving: Slow  processing;Decreased initiation;Difficulty sequencing;Requires verbal cues;Requires tactile cues General Comments: decreased arousal requiring multimodal cues for command follow. Pt unable to keep eyes open for >10 secs at a time.   General Comments  BP: 152/73 in sittng; 139/73 after exertion in sitting.    Exercises     Shoulder Instructions      Home Living Family/patient expects to be discharged to:: Skilled nursing facility Living Arrangements: Spouse/significant other Available Help at Discharge: Family(not all of the time; she has back issues ) Type of Home: House Home Access: Stairs to enter [employer] of Steps: 3 Entrance Stairs-Rails: Right Home Layout: One level     Bathroom Shower/Tub: Teacher, early years/pre: Standard Bathroom Accessibility: Yes   Home Equipment: Environmental consultant - 2 wheels;Cane - quad;Wheelchair - manual          Prior Functioning/Environment Level of Independence: Independent        Comments: used walker in few days PTA due to increasing weakness; cane intermittently but mostly ambulated without an AD        OT Problem List: Decreased strength;Decreased activity tolerance;Impaired balance (sitting and/or standing);Decreased safety awareness;Impaired UE functional use;Increased edema      OT Treatment/Interventions: Self-care/ADL training;Therapeutic exercise;Neuromuscular education;Energy conservation;Therapeutic activities;Patient/family education;Balance training;Cognitive remediation/compensation;Visual/perceptual remediation/compensation    OT Goals(Current goals can be found in the care plan section) Acute Rehab OT Goals Patient Stated Goal: did not state OT Goal Formulation: With patient Time For Goal Achievement: 11/19/19 Potential to Achieve Goals: Good ADL Goals Pt Will Perform Grooming: with set-up;sitting Pt Will Perform Lower Body Dressing: with min assist;sitting/lateral leans;sit to/from stand Pt Will  Transfer to Toilet: with mod assist;squat pivot transfer;bedside commode Pt/caregiver will Perform Home Exercise Program: Increased strength;Right Upper extremity;Left upper extremity Additional ADL Goal #1: Pt will attend x10 mins with sustained attention and minimal cueing required. Additional ADL Goal #2: Pt will perform visual scanning/perceptual tasks with 90% accuracy with minimal cueing to attend to task  OT Frequency: Min 2X/week   Barriers to D/C: Decreased caregiver support          Co-evaluation              AM-PAC OT "6 Clicks" Daily Activity     Outcome Measure Help from another person eating meals?: Total Help from another person taking care of personal grooming?: A Lot Help from another person toileting, which includes using toliet, bedpan, or urinal?: A Lot Help from another person bathing (including washing, rinsing, drying)?: A Lot Help from another person to put on and taking off regular upper body clothing?: A Little Help from another person to put on and taking off regular lower body clothing?: A Lot 6 Click Score: 12   End of Session Equipment Utilized During Treatment: Gait belt;Rolling walker Nurse Communication: Mobility status  Activity Tolerance: Patient tolerated treatment well Patient left: in chair;with call bell/phone within reach;with chair alarm set  OT Visit Diagnosis: Muscle weakness (generalized) (M62.81);Other symptoms and signs involving cognitive function                Time: 0929-1006 OT Time Calculation (min): 37 min Charges:  OT General Charges $OT Visit: 1 Visit OT Evaluation $OT Eval Moderate Complexity: [place] OTR/L Acute Rehabilitation Services Pager: [phone] Office: [phone]   Ceili Boshers C 11/05/2019, 4:39 PM

## 2019-11-05 NOTE — Evaluation (Signed)
Physical Therapy Re-evaluation Patient Details Name: Tommy Green MRN: [id number] DOB: 1946/06/29 Today's Date: 11/05/2019   History of Present Illness  74 yo male admitted with slurred speech and weakness and recent falls. chronic L SDH with mass effect (had blunt force trauma a few weeks PTA); craniotomy 10/25/19, intubated 1/11-extubated 11/04/19 PMH CVA DM with significant bil LE neuropathy HLD HTN CKD  Clinical Impression   Pt admitted with above diagnosis. Patient initially evaluated by PT 10/26/19, however with medical decline and intubation, pt was discharged from PT. Patient overall weaker today and required increased assist for transfer (unable to use RW as he previously did). Previous evaluation wife indicated she has back problems and cannot care for pt at this level. She agreed with need for SNF for further rehab. Patient with impaired strength, balance, cognition with resulting dependencies in mobility (see functional limitations due to the deficits listed below (see PT Problem List). Pt will benefit from skilled PT to increase their independence and safety with mobility to allow discharge to the venue listed below.       Follow Up Recommendations SNF;Supervision for mobility/OOB    Equipment Recommendations  None recommended by PT(TBD next venue of care)    Recommendations for Other Services       Precautions / Restrictions Precautions Precautions: Fall Restrictions Weight Bearing Restrictions: No      Mobility  Bed Mobility Overal bed mobility: Needs Assistance Bed Mobility: Supine to Sit     Supine to sit: Mod assist;+2 for physical assistance;HOB elevated     General bed mobility comments: assist for righting trunk, maintaining balance and scooting to EOB  Transfers Overall transfer level: Needs assistance Equipment used: Rolling walker (2 wheeled);2 person hand held assist Transfers: Sit to/from [facility] Sit to Stand: Mod assist;+2  physical assistance;From elevated surface Stand pivot transfers: Mod assist;+2 physical assistance;From elevated surface       General transfer comment: mod assist +2 for walker management and boost to stand (never fully extending hips/knees however able to stand ~2 minutes for pericare); max cues for sequencing with RW with rt hand poor ability to grasp handle; for pivot removed RW and 2 person assist from in front of pt with max support to rt knee due to buckling  Ambulation/Gait                Stairs            Wheelchair Mobility    Modified Rankin (Stroke Patients Only)       Balance Overall balance assessment: Needs assistance Sitting-balance support: Bilateral upper extremity supported;Feet supported Sitting balance-Leahy Scale: Poor   Postural control: Right lateral lean;Posterior lean Standing balance support: Bilateral upper extremity supported;During functional activity Standing balance-Leahy Scale: Poor Standing balance comment: reliant on BUE on RW                             Pertinent Vitals/Pain Pain Assessment: No/denies pain Faces Pain Scale: No hurt    Home Living Family/patient expects to be discharged to:: Skilled nursing facility Living Arrangements: Spouse/significant other Available Help at Discharge: Family(not all of the time; she has back issues ) Type of Home: House Home Access: Stairs to enter Entrance Stairs-Rails: Right Entrance Stairs-Number of Steps: 3 Home Layout: One level Home Equipment: Walker - 2 wheels;Cane - quad;Wheelchair - manual      Prior Function Level of Independence: Independent  Comments: used walker in few days PTA due to increasing weakness; cane intermittently but mostly ambulated without an AD     Hand Dominance   Dominant Hand: Right    Extremity/Trunk Assessment   Upper Extremity Assessment Upper Extremity Assessment: Defer to OT evaluation    Lower Extremity  Assessment Lower Extremity Assessment: RLE deficits/detail;LLE deficits/detail RLE Deficits / Details: <4 hip, knee extension, DF 3+; tends to position RLE in abdct and ER; able to achieve neutral rotation only  LLE Deficits / Details: generalized weakness    Cervical / Trunk Assessment Cervical / Trunk Assessment: Other exceptions Cervical / Trunk Exceptions: obese  Communication   Communication: Expressive difficulties  Cognition Arousal/Alertness: Lethargic Behavior During Therapy: Flat affect Overall Cognitive Status: Impaired/Different from baseline Area of Impairment: Attention;Memory;Following commands;Safety/judgement;Awareness;Problem solving;Orientation                 Orientation Level: Disoriented to;Situation(Time NT; initially stated in hospital for back problem;) Current Attention Level: Sustained Memory: Decreased short-term memory;Decreased recall of precautions Following Commands: Follows one step commands inconsistently;Follows one step commands with increased time Safety/Judgement: Decreased awareness of safety;Decreased awareness of deficits Awareness: Intellectual Problem Solving: Slow processing;Decreased initiation;Difficulty sequencing;Requires verbal cues;Requires tactile cues General Comments: need to repeat commands several times before he performs, selective attention only up to 10 seconds (frequent eye-closing, but arouses easily)      General Comments      Exercises     Assessment/Plan    PT Assessment Patient needs continued PT services  PT Problem List Decreased strength;Decreased range of motion;Decreased activity tolerance;Decreased balance;Decreased mobility;Decreased coordination;Decreased cognition;Decreased knowledge of use of DME;Decreased safety awareness;Decreased knowledge of precautions;Obesity       PT Treatment Interventions DME instruction;Gait training;Stair training;Functional mobility training;Therapeutic  activities;Therapeutic exercise;Balance training;Neuromuscular re-education;Cognitive remediation;Patient/family education;Wheelchair mobility training;Modalities    PT Goals (Current goals can be found in the Care Plan section)  Acute Rehab PT Goals Patient Stated Goal: did not state PT Goal Formulation: Patient unable to participate in goal setting Time For Goal Achievement: 11/19/19 Potential to Achieve Goals: Fair    Frequency Min 3X/week   Barriers to discharge Decreased caregiver support wife has a bad back per previous evaluation and desires SNF    Co-evaluation               AM-PAC PT "6 Clicks" Mobility  Outcome Measure Help needed turning from your back to your side while in a flat bed without using bedrails?: A Lot Help needed moving from lying on your back to sitting on the side of a flat bed without using bedrails?: A Lot Help needed moving to and from a bed to a chair (including a wheelchair)?: A Lot Help needed standing up from a chair using your arms (e.g., wheelchair or bedside chair)?: A Lot Help needed to walk in hospital room?: Total Help needed climbing 3-5 steps with a railing? : Total 6 Click Score: 10    End of Session Equipment Utilized During Treatment: Gait belt Activity Tolerance: Patient limited by lethargy Patient left: in chair;with call bell/phone within reach;with chair alarm set Nurse Communication: Mobility status;Need for lift equipment(lift for return to bed; pad behind pt) PT Visit Diagnosis: Other abnormalities of gait and mobility (R26.89);Unsteadiness on feet (R26.81);Repeated falls (R29.6);Muscle weakness (generalized) (M62.81);Pain;Other symptoms and signs involving the nervous system (R29.898);Hemiplegia and hemiparesis Hemiplegia - Right/Left: Right Hemiplegia - dominant/non-dominant: Dominant Hemiplegia - caused by: Nontraumatic SAH    Time: [id number] PT Time Calculation (min) (ACUTE ONLY): 38 min  Charges:   PT  Evaluation $PT Re-evaluation: 1 Re-eval PT Treatments $Therapeutic Activity: 8-22 mins         Arby Barrette, PT Pager [phone]   Rexanne Mano 11/05/2019, 12:55 PM

## 2019-11-05 NOTE — Progress Notes (Signed)
Subjective: Extubated yesterday  Objective: Vital signs in last 24 hours: Temp:  [98.7 F (37.1 C)-99.4 F (37.4 C)] 98.8 F (37.1 C) (01/18 0800) Pulse Rate:  [33-76] 71 (01/18 0800) Resp:  [12-26] 19 (01/18 0800) BP: (105-152)/(52-81) 138/62 (01/18 0800) SpO2:  [91 %-100 %] 94 % (01/18 0800) FiO2 (%):  [50 %] 50 % (01/17 1600)  Intake/Output from previous day: 01/17 0701 - 01/18 0700 In: 1002.1 [I.V.:222.1; NG/GT:680; IV Piggyback:100] Out: 3825 [Urine:3825] Intake/Output this shift: No intake/output data recorded.  Eyes open spontaneously, PERRL Awake, Oriented to person. FC x 4. Minimal right sided drift.  Lab Results: Recent Labs    11/04/19 0815 11/05/19 0643  WBC 13.7* 16.0*  HGB 7.0* 8.0*  HCT 23.0* 26.5*  PLT 250 330   BMET Recent Labs    11/04/19 0815 11/05/19 0643  NA 147* 148*  K 3.8 3.6  CL 113* 111  CO2 25 24  GLUCOSE 250* 178*  BUN 56* 43*  CREATININE 1.44* 1.21  CALCIUM 8.5* 8.9    Studies/Results: DG CHEST PORT 1 VIEW  Result Date: 11/05/2019 CLINICAL DATA:  Hypoxia. EXAM: PORTABLE CHEST 1 VIEW COMPARISON:  November 03, 2019. FINDINGS: Stable cardiomegaly. No pneumothorax pleural effusion is noted. Mild right basilar atelectasis or infiltrate is noted. Left retrocardiac opacity can not be excluded. Feeding tube is seen entering stomach. Bony thorax is unremarkable. IMPRESSION: Mild right basilar atelectasis or infiltrate is noted. Left retrocardiac opacity can not be excluded. Electronically Signed   By: Marijo Conception M.D.   On: 11/05/2019 08:14    Assessment/Plan: S/p evacuation of SDH, doing well extubated - PT/OT - chest PT - speech for swallow - cont atbx    LOS: 12 days     Vallarie Mare 11/05/2019, 8:57 AM

## 2019-11-05 NOTE — Progress Notes (Signed)
eLink Physician-Brief Progress Note Patient Name: Tommy Green DOB: 09/13/1946 MRN: [id number]   Date of Service  11/05/2019  HPI/Events of Note  Patient sounds congested. Nursing request for portable CXR.  eICU Interventions  Will order: 1. Portable CXR now.  2. NT Suction PRN.     Intervention Category Major Interventions: Respiratory failure - evaluation and management  Ronne Savoia Eugene 11/05/2019, 3:53 AM

## 2019-11-06 LAB — BASIC METABOLIC PANEL
Anion gap: 10 (ref 5–15)
BUN: 39 mg/dL — ABNORMAL HIGH (ref 8–23)
CO2: 27 mmol/L (ref 22–32)
Calcium: 8.7 mg/dL — ABNORMAL LOW (ref 8.9–10.3)
Chloride: 111 mmol/L (ref 98–111)
Creatinine, Ser: 1.2 mg/dL (ref 0.61–1.24)
GFR calc Af Amer: >60 mL/min (ref >60)
GFR calc non Af Amer: 59 mL/min — ABNORMAL LOW (ref >60)
Glucose, Bld: 274 mg/dL — ABNORMAL HIGH (ref 70–99)
Potassium: 3.5 mmol/L (ref 3.5–5.1)
Sodium: 148 mmol/L — ABNORMAL HIGH (ref 135–145)

## 2019-11-06 LAB — GLUCOSE, CAPILLARY
Glucose-Capillary: 247 mg/dL — ABNORMAL HIGH (ref 70–99)
Glucose-Capillary: 259 mg/dL — ABNORMAL HIGH (ref 70–99)
Glucose-Capillary: 235 mg/dL — ABNORMAL HIGH (ref 70–99)
Glucose-Capillary: 268 mg/dL — ABNORMAL HIGH (ref 70–99)
Glucose-Capillary: 235 mg/dL — ABNORMAL HIGH (ref 70–99)
Glucose-Capillary: 295 mg/dL — ABNORMAL HIGH (ref 70–99)

## 2019-11-06 LAB — CBC
HCT: 26.4 % — ABNORMAL LOW (ref 39.0–52.0)
Hemoglobin: 8 g/dL — ABNORMAL LOW (ref 13.0–17.0)
MCH: 25.8 pg — ABNORMAL LOW (ref 26.0–34.0)
MCHC: 30.3 g/dL (ref 30.0–36.0)
MCV: 85.2 fL (ref 80.0–100.0)
Platelets: 299 10*3/uL (ref 150–400)
RBC: 3.1 MIL/uL — ABNORMAL LOW (ref 4.22–5.81)
RDW: 15.5 % (ref 11.5–15.5)
WBC: 15.7 10*3/uL — ABNORMAL HIGH (ref 4.0–10.5)
nRBC: 0 % (ref 0.0–0.2)

## 2019-11-06 MED ORDER — PRO-STAT SUGAR FREE PO LIQD
30.0000 mL | Freq: Every day | ORAL | Status: DC
  Administered 2019-11-06 – 2019-11-08 (×3): 30 mL
  Filled 2019-11-06 (×3): qty 30

## 2019-11-06 MED ORDER — OSMOLITE 1.2 CAL PO LIQD
1000.0000 mL | ORAL | Status: DC
  Administered 2019-11-06: 14:00:00 1000 mL
  Filled 2019-11-06 (×5): qty 1000

## 2019-11-06 NOTE — Progress Notes (Signed)
Nutrition Follow-up  DOCUMENTATION CODES:   Morbid obesity  INTERVENTION:   D/C Vital High Protein    Initiate Osmolite 1.2 @ 70 ml/hr 30 ml Prostat daily  Provides: 2116 kcal, 108 grams protein, and 1362 ml free water.    NUTRITION DIAGNOSIS:   Inadequate oral intake related to inability to eat as evidenced by NPO status. Ongoing.   GOAL:   Patient will meet greater than or equal to 90% of their needs Met.   MONITOR:   Vent status, TF tolerance  REASON FOR ASSESSMENT:   Consult Enteral/tube feeding initiation and management  ASSESSMENT:    Pt with a PMH significant for DM, HTN, HLD, CVA. Pt suffered blunt head trauma several weeks ago and subsequently developed confusion, aphasia, and mobility problems to the point of inability to perform ADLs. Pt found to have loculated AoC left SDH with mass effect requiring craniotomy and evacuation of SDH.  Pt discussed during ICU rounds and with RN.  SLP following, pt not ready for a MBS yet.   1/7 craniotomy and evacuation of SDH, extubated in PACU, transferred to ICU 1/11 intubated  1/15 cortrak placed, tip gastric  1/17 extubated  Medications reviewed and include: Colace, 318m free water flush every 4 hours, SSI, Lantus 18 units daily, Senokot-S  Labs reviewed: Sodium 148 (H), CBGs 253-247-259  UOP: 3325 mL I/O: +11L since admit   Diet Order:   Diet Order            Diet NPO time specified Except for: Ice Chips  Diet effective now              EDUCATION NEEDS:   No education needs have been identified at this time  Skin:  Skin Assessment: Reviewed RN Assessment  Last BM:  1/18  Height:   Ht Readings from Last 1 Encounters:  10/25/19 5' 4"  (1.626 m)    Weight:   Wt Readings from Last 1 Encounters:  11/06/19 106.2 kg    Ideal Body Weight:  59.09 kg  BMI:  Body mass index is 40.19 kg/m.  Estimated Nutritional Needs:   Kcal:  2000-2200  Protein:  100-125 grams  Fluid:  2  L/day  HMaylon PeppersRD, LDN, CNSC [phone] [phone] Hours Pager

## 2019-11-06 NOTE — Progress Notes (Signed)
Subjective: Patient reports no headache, no shortness of breath.  Objective: Vital signs in last 24 hours: Temp:  [97.4 F (36.3 C)-99.6 F (37.6 C)] 97.4 F (36.3 C) (01/19 1547) Pulse Rate:  [30-102] 75 (01/19 1547) Resp:  [14-27] 20 (01/19 1547) BP: (104-165)/(62-112) 138/64 (01/19 1547) SpO2:  [92 %-100 %] 96 % (01/19 1547) FiO2 (%):  [40 %] 40 % (01/19 0337) Weight:  [106.2 kg] 106.2 kg (01/19 0422)  Intake/Output from previous day: 01/18 0701 - 01/19 0700 In: 3179 [NG/GT:3179] Out: [id number] [Urine:3325] Intake/Output this shift: Total I/O In: 931.2 [NG/GT:931.2] Out: 950 [Urine:950]  Eyes open spont, PERRL.  Awake, alert.  Oriented to person only. FC x 4, no drift.  Lab Results: Recent Labs    11/05/19 0643 11/06/19 0431  WBC 16.0* 15.7*  HGB 8.0* 8.0*  HCT 26.5* 26.4*  PLT 330 299   BMET Recent Labs    11/05/19 0643 11/06/19 0431  NA 148* 148*  K 3.6 3.5  CL 111 111  CO2 24 27  GLUCOSE 178* 274*  BUN 43* 39*  CREATININE 1.21 1.20  CALCIUM 8.9 8.7*    Studies/Results: DG CHEST PORT 1 VIEW  Result Date: 11/05/2019 CLINICAL DATA:  Hypoxia. EXAM: PORTABLE CHEST 1 VIEW COMPARISON:  November 03, 2019. FINDINGS: Stable cardiomegaly. No pneumothorax pleural effusion is noted. Mild right basilar atelectasis or infiltrate is noted. Left retrocardiac opacity can not be excluded. Feeding tube is seen entering stomach. Bony thorax is unremarkable. IMPRESSION: Mild right basilar atelectasis or infiltrate is noted. Left retrocardiac opacity can not be excluded. Electronically Signed   By: Marijo Conception M.D.   On: 11/05/2019 08:14    Assessment/Plan: S/p craniotomy for evacuation of acute-on-chronic SDH - will remove staples - diet per Speech, cont TFs for now - DM per diabetes - as no active neurosurgical issues, will plan to transfer to medicine tomorrow  LOS: 13 days     Tommy Green 11/06/2019, 5:54 PM

## 2019-11-06 NOTE — Progress Notes (Signed)
  Speech Language Pathology Treatment: Dysphagia  Patient Details Name: Tommy Green MRN: [id number] DOB: 07-24-1946 Today's Date: 11/06/2019 Time: [id number] SLP Time Calculation (min) (ACUTE ONLY): 21 min  Assessment / Plan / Recommendation Clinical Impression  Pt shows mild improvements in phonation this morning, as well as reduced s/s concerning for aspiration with puree. He continues to cough with larger boluses of thin liquids and is more lethargic this morning though - he says he got some medicine, but RN says that this is not accurate. From a clinical standpoint I think he is nearing readiness for MBS, but I would like for him to be more alert for testing so that he will hopefully be more successful. I think waiting a day to see if he is more alert (and giving him additional time post-extubation) would be helpful. Discussed this plan also with [name] and his wife via phone. Tommy Green makes it very clear that she and Tommy Green would prioritize eating and drinking (and communication) for his quality of life, and that he would not want a PEG. She is in agreement with proceeding with MBS when more alert. For today, would allow ice chips in moderation (administered by staff) after oral care.   HPI HPI: Pt is a 74 y.o. male who has a PMH w prior CVA, diabetes, HLD, and HTN. He suffered blunt head trauma a few weeks back and had no concerning findings on imaging.  Subsequently developed confusion, aphasia, mobility problems to the point he could no longer perform any ADLs. He presented to [facility] ED 1/6 and CT head showed loculated AoC left SDH with mass effect. Pt is now post evacuation of SDH on 1/7.  ETT 1/7 for procedure and again 1/11-1/17. Swallow eval 1/8 recommended NPO due to mentation and moderate-severe oral deficits.      SLP Plan  Continue with current plan of care       Recommendations  Diet recommendations: NPO;Other(comment)(few ice chips at a time after oral care) Medication  Administration: Via alternative means                Oral Care Recommendations: Oral care QID;Oral care prior to ice chip/H20 Follow up Recommendations: Skilled Nursing facility SLP Visit Diagnosis: Dysphagia, unspecified (R13.10) Plan: Continue with current plan of care       GO                Tommy Green., M.A. [facility] Pager [phone] Office [phone]  11/06/2019, 8:50 AM

## 2019-11-06 NOTE — Progress Notes (Signed)
RT went to place pt on bipap for the night, pt in restraints, he was pulling at leads trying to get out of bed. RN is aware that RT is unable to place pt on machine if pt is restrained. Pt placed on 2 L [place] and is tolerating well at this time. All vitals signs stable. RT will continue to monitor as needed.

## 2019-11-06 NOTE — Progress Notes (Signed)
Inpatient Diabetes Program Recommendations  AACE/ADA: New Consensus Statement on Inpatient Glycemic Control (2015)  Target Ranges:  Prepandial:   less than 140 mg/dL      Peak postprandial:   less than 180 mg/dL (1-2 hours)      Critically ill patients:  140 - 180 mg/dL   Lab Results  Component Value Date   GLUCAP 235 (H) 11/06/2019   HGBA1C 7.5 (H) 10/25/2019    Review of Glycemic Control   Results for Tommy Green, Tommy Green (MRN [id number]) as of 11/06/2019 13:03  Ref. Range 11/05/2019 16:23 11/05/2019 20:08 11/05/2019 23:45 11/06/2019 03:53 11/06/2019 08:21 11/06/2019 12:17  Glucose-Capillary Latest Ref Range: 70 - 99 mg/dL 273 (H) 252 (H) 253 (H) 247 (H) 259 (H) 235 (H)   Diabetes history: DM2 Outpatient Diabetes medications: Novolog SSI TID + Tresiba 60 units QHS + Metformin 500 mg BID +  Current orders for Inpatient glycemic control: Lantus 18 units QD + Novolog 0-20 Q4H  Inpatient Diabetes Program Recommendations:     -Novolog 3 units Q4H tube feed coverage, stop if tube feeds held or stopped  Thank you, Reche Dixon, RN, BSN Diabetes Coordinator Inpatient Diabetes Program [phone] (team pager from 8a-5p)

## 2019-11-07 ENCOUNTER — Inpatient Hospital Stay (HOSPITAL_COMMUNITY): Payer: Medicare HMO

## 2019-11-07 LAB — BASIC METABOLIC PANEL
Anion gap: 10 (ref 5–15)
BUN: 32 mg/dL — ABNORMAL HIGH (ref 8–23)
CO2: 26 mmol/L (ref 22–32)
Calcium: 8.4 mg/dL — ABNORMAL LOW (ref 8.9–10.3)
Chloride: 110 mmol/L (ref 98–111)
Creatinine, Ser: 1.21 mg/dL (ref 0.61–1.24)
GFR calc Af Amer: >60 mL/min (ref >60)
GFR calc non Af Amer: 58 mL/min — ABNORMAL LOW (ref >60)
Glucose, Bld: 275 mg/dL — ABNORMAL HIGH (ref 70–99)
Potassium: 3.3 mmol/L — ABNORMAL LOW (ref 3.5–5.1)
Sodium: 146 mmol/L — ABNORMAL HIGH (ref 135–145)

## 2019-11-07 LAB — GLUCOSE, CAPILLARY
Glucose-Capillary: 242 mg/dL — ABNORMAL HIGH (ref 70–99)
Glucose-Capillary: 245 mg/dL — ABNORMAL HIGH (ref 70–99)
Glucose-Capillary: 254 mg/dL — ABNORMAL HIGH (ref 70–99)
Glucose-Capillary: 275 mg/dL — ABNORMAL HIGH (ref 70–99)
Glucose-Capillary: 295 mg/dL — ABNORMAL HIGH (ref 70–99)
Glucose-Capillary: 314 mg/dL — ABNORMAL HIGH (ref 70–99)
Glucose-Capillary: 348 mg/dL — ABNORMAL HIGH (ref 70–99)

## 2019-11-07 LAB — CBC
HCT: 27 % — ABNORMAL LOW (ref 39.0–52.0)
Hemoglobin: 8.3 g/dL — ABNORMAL LOW (ref 13.0–17.0)
MCH: 26.3 pg (ref 26.0–34.0)
MCHC: 30.7 g/dL (ref 30.0–36.0)
MCV: 85.4 fL (ref 80.0–100.0)
Platelets: 324 10*3/uL (ref 150–400)
RBC: 3.16 MIL/uL — ABNORMAL LOW (ref 4.22–5.81)
RDW: 15.4 % (ref 11.5–15.5)
WBC: 14.2 10*3/uL — ABNORMAL HIGH (ref 4.0–10.5)
nRBC: 0 % (ref 0.0–0.2)

## 2019-11-07 MED ORDER — QUETIAPINE FUMARATE 25 MG PO TABS
25.0000 mg | ORAL_TABLET | Freq: Every day | ORAL | Status: DC
  Administered 2019-11-07: 22:00:00 25 mg via ORAL
  Filled 2019-11-07: qty 1

## 2019-11-07 MED ORDER — GABAPENTIN 250 MG/5ML PO SOLN
600.0000 mg | Freq: Three times a day (TID) | ORAL | Status: DC
  Administered 2019-11-07 – 2019-11-15 (×21): 600 mg via ORAL
  Filled 2019-11-07 (×28): qty 12

## 2019-11-07 MED ORDER — HALOPERIDOL LACTATE 5 MG/ML IJ SOLN
5.0000 mg | Freq: Four times a day (QID) | INTRAMUSCULAR | Status: DC | PRN
  Administered 2019-11-08 – 2019-11-14 (×8): 5 mg via INTRAVENOUS
  Filled 2019-11-07 (×10): qty 1

## 2019-11-07 MED ORDER — DOCUSATE SODIUM 50 MG/5ML PO LIQD
100.0000 mg | Freq: Two times a day (BID) | ORAL | Status: DC
  Administered 2019-11-07 – 2019-11-13 (×8): 100 mg via ORAL
  Filled 2019-11-07 (×12): qty 10

## 2019-11-07 MED ORDER — RESOURCE THICKENUP CLEAR PO POWD
ORAL | Status: DC | PRN
  Filled 2019-11-07: qty 125

## 2019-11-07 MED ORDER — SENNOSIDES-DOCUSATE SODIUM 8.6-50 MG PO TABS
1.0000 | ORAL_TABLET | Freq: Two times a day (BID) | ORAL | Status: DC
  Administered 2019-11-07 – 2019-11-16 (×10): 1 via ORAL
  Filled 2019-11-07 (×12): qty 1

## 2019-11-07 NOTE — Social Work (Signed)
CSW acknowledging recommendation for SNF placement.  PASRR obtained but pt not medically appropriate for placement at this time. TOC team will follow for stability.    Westley Hummer, MSW, [place] Work

## 2019-11-07 NOTE — Progress Notes (Addendum)
Pt has meds per tube without tube placement. Per Dr. Duffy Rhody, V.O. to admin meds as PO meds. Order changed

## 2019-11-07 NOTE — Progress Notes (Addendum)
Modified Barium Swallow Progress Note  Patient Details  Name: Tommy Green MRN: [id number] Date of Birth: 04-16-1946  Today's Date: 11/07/2019  Modified Barium Swallow completed.  Full report located under Chart Review in the Imaging Section.  Brief recommendations include the following:  Clinical Impression  Pt has a mild-moderate oropharyngeal dysphagia with oral deficits likely related to acute neurological issues and cognition with pharyngeal phase suspect to be at least partly secondary to post-extubation changes. He has occasional oral holding but typically initiates manipulation and transit on his own. His lingual manipulation is weak though and there is lingual pumping, delayed transit, reduced bolus cohesion, and premature spillage of liquids. Mild lingual residue is noted, increasing as boluses become more solid. He uses a spontaneous, although somewhat slow, second swallow that clears oral and pharyngeal residue that sits in the valleculae > pyriform sinuses. Pharyngeally pt has reduced laryngeal vestibule closure, anterior hyolaryngeal excursion, and epiglottic deflection. Epiglottic deflection was improved with heavier boluses and may improve further if Cortrak can be removed. Thin liquids were the only consistency that entered his airway before and during the swallow with trace amount of silent aspiration noted. I would favor starting a little more cautiously with his diet (Dys 1 solids, nectar thick liquids) given that he has had fluctuating alertness and increased s/s of aspiration at bedside. SLP will f/u clinically for potential to upgrade as mentation and vocal quality improve.    Swallow Evaluation Recommendations       SLP Diet Recommendations: Dysphagia 1 (Puree) solids;Nectar thick liquid   Liquid Administration via: Cup;Straw   Medication Administration: Whole meds with puree(crush if large)   Supervision: Patient able to self feed;Full supervision/cueing for  compensatory strategies   Compensations: Minimize environmental distractions;Slow rate;Small sips/bites   Postural Changes: Seated upright at 90 degrees   Oral Care Recommendations: Oral care BID   Other Recommendations: Order thickener from pharmacy;Prohibited food (jello, ice cream, thin soups);Remove water pitcher;Have oral suction available     Osie Bond., M.A. [place] Pager [phone] Office [phone]  11/07/2019,2:51 PM

## 2019-11-07 NOTE — Progress Notes (Signed)
49 staples removed. No bleeding or dehiscence. Pt tolerated well

## 2019-11-07 NOTE — Progress Notes (Signed)
Subjective: Patient reports no headache, n/v  Objective: Vital signs in last 24 hours: Temp:  [97.4 F (36.3 C)-99.2 F (37.3 C)] 98 F (36.7 C) (01/20 0801) Pulse Rate:  [69-89] 85 (01/20 0801) Resp:  [18-27] 18 (01/20 0801) BP: (129-163)/(64-110) 151/110 (01/20 0801) SpO2:  [92 %-97 %] 97 % (01/20 0801)  Intake/Output from previous day: 01/19 0701 - 01/20 0700 In: 931.2 [NG/GT:931.2] Out: 2125 [Urine:2125] Intake/Output this shift: No intake/output data recorded.  Awake, alert.  Confused.  Oriented to person, year.  Hoarse voice. FC x 4 with minimal drift on right side. Incision c/d/i  Lab Results: Recent Labs    11/06/19 0431 11/07/19 0310  WBC 15.7* 14.2*  HGB 8.0* 8.3*  HCT 26.4* 27.0*  PLT 299 324   BMET Recent Labs    11/06/19 0431 11/07/19 0310  NA 148* 146*  K 3.5 3.3*  CL 111 110  CO2 27 26  GLUCOSE 274* 275*  BUN 39* 32*  CREATININE 1.20 1.21  CALCIUM 8.7* 8.4*    Studies/Results: No results found.  Assessment/Plan: S/p craniotomy for evacuation of acute-on-chronic SDH, recently downgraded from ICU - staples removed - diet per Speech, cont TFs for now - DM per diabetes - as no active neurosurgical issues, will plan to transfer to medicine today  Vallarie Mare 11/07/2019, 9:52 AM

## 2019-11-07 NOTE — Progress Notes (Addendum)
Inpatient Diabetes Program Recommendations  AACE/ADA: New Consensus Statement on Inpatient Glycemic Control (2015)  Target Ranges:  Prepandial:   less than 140 mg/dL      Peak postprandial:   less than 180 mg/dL (1-2 hours)      Critically ill patients:  140 - 180 mg/dL   Lab Results  Component Value Date   GLUCAP 275 (H) 11/07/2019   HGBA1C 7.5 (H) 10/25/2019    Review of Glycemic Control Results for Tommy Green, Tommy Green (MRN [id number]) as of 11/07/2019 14:45  Ref. Range 11/06/2019 20:05 11/06/2019 23:12 11/07/2019 03:01 11/07/2019 08:05 11/07/2019 11:52  Glucose-Capillary Latest Ref Range: 70 - 99 mg/dL 235 (H) 295 (H) 245 (H) 295 (H) 275 (H)   Diabetes history: DM 2 Outpatient Diabetes medications:  Novolog 75 units daily, Tresiba 60 units q HS Metformin 500 mg bid Current orders for Inpatient glycemic control:  Novolog resistant q 4 hours Osmolite 70 ml/hr Lantus 18 units q HS  Inpatient Diabetes Program Recommendations:    Consider increasing Lantus to 25 units daily.    Thanks,  Adah Perl, RN, BC-ADM Inpatient Diabetes Coordinator Pager [phone]- Per RN patient is no longer on tube feeds.  Therefore will not need tube feed coverage.

## 2019-11-07 NOTE — Progress Notes (Signed)
Patient is back in restraints.  Bipap is contraindicated at this time.  No distress noted, patient sat is 96%, will continue to monitor.

## 2019-11-07 NOTE — Progress Notes (Signed)
This is a 74 year old male wh osuffered blunt head trauma several weeks ago, initially without changes on imaging however developed confusion, aphasia, mobility issues to the point he could no longer perform ADLs. Presented to [facility] 1/6 and CT head showed loculated AoC left SDH with mass effect. Transferred to [facility] for neurosurgery evaluation. On 1/7 was taken to OR for craniotomy and evacuation of SDH. PCCM consulted for vent management post op however patient was extubated in PACU prior to transfer to ICU. Seen by Dr. Elsworth Soho, PCCM, on 1/8 and signed off. Had increased respiratory distress and AMS on 1/9 and PCCM was reconsulted. Completed 5 days rocephin for H flu pneumonia and PCCM signed off 1/18.  Has not required any further neurosurgical interventions.  I discussed patient with Dr. Marcello Moores, Neurosurgery, patient is appropriate for transfer from Neurosurgery to [facility] service and will likely need SNF placement at discharge. Dr. Marcello Moores to continue as primary attending today and care will begin under medicine service tomorrow, 1/21.    Harold Hedge, DO [facility] Pager: [phone]

## 2019-11-08 LAB — GLUCOSE, CAPILLARY
Glucose-Capillary: 154 mg/dL — ABNORMAL HIGH (ref 70–99)
Glucose-Capillary: 151 mg/dL — ABNORMAL HIGH (ref 70–99)
Glucose-Capillary: 278 mg/dL — ABNORMAL HIGH (ref 70–99)
Glucose-Capillary: 328 mg/dL — ABNORMAL HIGH (ref 70–99)
Glucose-Capillary: 260 mg/dL — ABNORMAL HIGH (ref 70–99)
Glucose-Capillary: 249 mg/dL — ABNORMAL HIGH (ref 70–99)

## 2019-11-08 LAB — BASIC METABOLIC PANEL
Anion gap: 12 (ref 5–15)
BUN: 28 mg/dL — ABNORMAL HIGH (ref 8–23)
CO2: 28 mmol/L (ref 22–32)
Calcium: 8.7 mg/dL — ABNORMAL LOW (ref 8.9–10.3)
Chloride: 114 mmol/L — ABNORMAL HIGH (ref 98–111)
Creatinine, Ser: 1.07 mg/dL (ref 0.61–1.24)
GFR calc Af Amer: >60 mL/min (ref >60)
GFR calc non Af Amer: >60 mL/min (ref >60)
Glucose, Bld: 143 mg/dL — ABNORMAL HIGH (ref 70–99)
Potassium: 3.6 mmol/L (ref 3.5–5.1)
Sodium: 154 mmol/L — ABNORMAL HIGH (ref 135–145)

## 2019-11-08 LAB — CBC
HCT: 29.2 % — ABNORMAL LOW (ref 39.0–52.0)
Hemoglobin: 8.7 g/dL — ABNORMAL LOW (ref 13.0–17.0)
MCH: 25.7 pg — ABNORMAL LOW (ref 26.0–34.0)
MCHC: 29.8 g/dL — ABNORMAL LOW (ref 30.0–36.0)
MCV: 86.4 fL (ref 80.0–100.0)
Platelets: 370 10*3/uL (ref 150–400)
RBC: 3.38 MIL/uL — ABNORMAL LOW (ref 4.22–5.81)
RDW: 15.3 % (ref 11.5–15.5)
WBC: 15.4 10*3/uL — ABNORMAL HIGH (ref 4.0–10.5)
nRBC: 0 % (ref 0.0–0.2)

## 2019-11-08 MED ORDER — FREE WATER: 350.0000 mL | Status: DC

## 2019-11-08 MED ORDER — QUETIAPINE FUMARATE 25 MG PO TABS
25.0000 mg | ORAL_TABLET | Freq: Every evening | ORAL | Status: DC
  Administered 2019-11-08 – 2019-11-15 (×8): 25 mg via ORAL
  Filled 2019-11-08 (×8): qty 1

## 2019-11-08 MED ORDER — WHITE PETROLATUM EX OINT
TOPICAL_OINTMENT | CUTANEOUS | Status: AC
  Filled 2019-11-08: qty 28.35

## 2019-11-08 MED ORDER — INSULIN ASPART 100 UNIT/ML ~~LOC~~ SOLN
0.0000 [IU] | Freq: Three times a day (TID) | SUBCUTANEOUS | Status: DC
  Administered 2019-11-08: 15 [IU] via SUBCUTANEOUS

## 2019-11-08 MED ORDER — INSULIN ASPART 100 UNIT/ML ~~LOC~~ SOLN
0.0000 [IU] | Freq: Three times a day (TID) | SUBCUTANEOUS | Status: DC
  Administered 2019-11-08 – 2019-11-09 (×3): 7 [IU] via SUBCUTANEOUS
  Administered 2019-11-09: 18:00:00 11 [IU] via SUBCUTANEOUS
  Administered 2019-11-09: 13:00:00 7 [IU] via SUBCUTANEOUS
  Administered 2019-11-10: 07:00:00 3 [IU] via SUBCUTANEOUS

## 2019-11-08 MED ORDER — THIAMINE HCL 100 MG PO TABS
100.0000 mg | ORAL_TABLET | Freq: Every day | ORAL | Status: DC
  Administered 2019-11-08 – 2019-11-16 (×9): 100 mg via ORAL
  Filled 2019-11-08 (×9): qty 1
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
## 2019-11-08 NOTE — TOC Initial Note (Addendum)
Transition of Care [facility]) - Initial/Assessment Note    Patient Details  Name: Tommy Green MRN: [id number] Date of Birth: [birth year]  [facility]) CM/SW Contact:    Kirstie Peri, [place] Work Phone Number: 11/08/2019, 11:18 AM  Clinical Narrative:                  MSW Intern went to speak with PT about goal setting and discharge plans. PT was disoriented and upset. MSW Intern called spouse, [name], to discuss goals. She had concerns about her ability to care for the PT at home, due to his confusion and agitation. She stated that she would like for the PT to go to CIR. MSW Intern messaged attending to see if a consult could be ordered. [name] advised that she works at [employer] and provided a contact who had been discussing options with placement for the PT. MSW Intern contacted SW at [place] [phone]). MSW Intern left a message. SW will Complete an FL2 and send information to Spouses choices. She requested CIR as her first choice, [facility] as her second, and she also wanted [place] to look into [facility] and [facility]. Her wish is for the PT to be in [facility]. SW will continue to follow.  Addended 2:30 MSW Intern spoke with SW at [place] and sent over referal, left a message to wife to update her.   Expected Discharge Plan: Skilled Nursing Facility Barriers to Discharge: Continued Medical Work up   Patient Goals and CMS Choice Patient states their goals for this hospitalization and ongoing recovery are:: Patient unable to participate in goal setting due to disorientation. CMS Medicare.gov Compare Post Acute Care list provided to:: Patient Represenative (must comment)(Spouse) Choice offered to / list presented to : Spouse  Expected Discharge Plan and Services Expected Discharge Plan: [place] arrangements for the past 2 months: Single Family Home                                       Prior Living Arrangements/Services Living arrangements for the past 2 months: Single Family Home Lives with:: Spouse Patient language and need for interpreter reviewed:: Yes Do you feel safe going back to the place where you live?: Yes      Need for Family Participation in Patient Care: Yes (Comment) Care giver support system in place?: Yes (comment)   Criminal Activity/Legal Involvement Pertinent to Current Situation/Hospitalization: No - Comment as needed  Activities of Daily Living Home Assistive Devices/Equipment: Cane (specify quad or straight), Walker (specify type)(straight cane, 4 wheel walker) ADL Screening (condition at time of admission) Patient's cognitive ability adequate to safely complete daily activities?: Yes Is the patient deaf or have difficulty hearing?: No Does the patient have difficulty seeing, even when wearing glasses/contacts?: No Does the patient have difficulty concentrating, remembering, or making decisions?: No Patient able to express need for assistance with ADLs?: Yes Does the patient have difficulty dressing or bathing?: No Independently performs ADLs?: Yes (appropriate for developmental age) Does the patient have difficulty walking or climbing stairs?: Yes Weakness of Legs: Left Weakness of Arms/Hands: None  Permission Sought/Granted Permission sought to share information with : Facility Art therapist granted to share information with : Yes, Verbal Permission Granted  Share Information with NAME: Haruo Hockensmith granted to share info w AGENCY: [facility],  [facility] [place], [place]  Permission granted to share info w Relationship: Spouse  Permission granted to share info w Contact Information: [phone]  Emotional Assessment Appearance:: Other (Comment Required(Unable to Assess) Attitude/Demeanor/Rapport: Unable to Assess Affect (typically observed): Unable to Assess Orientation: : Oriented to  Self Alcohol / Substance Use: Not Applicable Psych Involvement: No (comment)  Admission diagnosis:  Subdural hematoma ([place]) [S06.5X9A] SDH (subdural hematoma) ([place]) [S06.5X9A] Patient Active Problem List   Diagnosis Date Noted  . Acute kidney injury superimposed on chronic kidney disease ([place])   . Respiratory failure ([place])   . Acute encephalopathy   . Hypernatremia   . Dyspnea   . Acute respiratory failure with hypoxemia ([place])   . SDH (subdural hematoma) ([place]) 10/24/2019  . Chronic pain of right knee 10/03/2019  . Cyclic vomiting syndrome 08/06/2019  . Neuropathy 12/25/2018  . Bilateral hip pain 08/24/2018  . AK (actinic keratosis) 08/24/2018  . Neoplasm of uncertain behavior 0000000  . 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 Tendinopathy of right gluteus medius 07/13/2018  . Tendinopathy of left gluteus medius 07/13/2018  . Squamous cell carcinoma in situ (SCCIS) of skin 03/24/2018  . Skin lesion 03/17/2018  . Essential hypertension 07/18/2017  . Hyperlipidemia associated with type 2 diabetes mellitus ([place]) 07/18/2017  . Type 2 diabetes mellitus with diabetic neuropathy, unspecified ([place]) 07/18/2017  . Heart murmur 07/18/2017  . Hypertriglyceridemia 07/18/2017  . History of stroke 07/18/2017  . Pain in joint, ankle and foot 02/06/2013   PCP:  [name], DO Pharmacy:   [facility] [address], [place] [address] [place] [address] Phone: [phone] Fax: [phone]  [facility] [place] [facility] [address] [place] [address] Phone: [phone] Fax: [phone]  [id number] [place], [place] [id number] [place] [address] [place] [address] Phone: [phone] Fax: [place], [place] [address] Phone: [phone] Fax: [phone]     Social Determinants of Health (SDOH) Interventions    Readmission Risk Interventions No flowsheet data found.

## 2019-11-08 NOTE — NC FL2 (Signed)
[place] LEVEL OF CARE SCREENING TOOL     IDENTIFICATION  Patient Name: Tommy Green Birthdate: Mar 07, 1946 Sex: male Admission Date (Current Location): 10/24/2019  [facility] and [place] Number:  Herbalist and Address:         Provider Number: [id number]  Attending Physician Name and Address:  Oswald Hillock, MD  Relative Name and Phone Number:  [name] [place],[phone]    Current Level of Care: Hospital Recommended Level of Care: [place] Prior Approval Number:    Date Approved/Denied:   PASRR Number: [id number]  Discharge Plan: SNF    Current Diagnoses: Patient Active Problem List   Diagnosis Date Noted  . Acute kidney injury superimposed on chronic kidney disease ([place])   . Respiratory failure ([place])   . Acute encephalopathy   . Hypernatremia   . Dyspnea   . Acute respiratory failure with hypoxemia ([place])   . SDH (subdural hematoma) ([place]) 10/24/2019  . Chronic pain of right knee 10/03/2019  . Cyclic vomiting syndrome 08/06/2019  . Neuropathy 12/25/2018  . Bilateral hip pain 08/24/2018  . AK (actinic keratosis) 08/24/2018  . Neoplasm of uncertain behavior 0000000  . 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 Tendinopathy of right gluteus medius 07/13/2018  . Tendinopathy of left gluteus medius 07/13/2018  . Squamous cell carcinoma in situ (SCCIS) of skin 03/24/2018  . Skin lesion 03/17/2018  . Essential hypertension 07/18/2017  . Hyperlipidemia associated with type 2 diabetes mellitus ([place]) 07/18/2017  . Type 2 diabetes mellitus with diabetic neuropathy, unspecified ([place]) 07/18/2017  . Heart murmur 07/18/2017  . Hypertriglyceridemia 07/18/2017  . History of stroke 07/18/2017  . Pain in joint, ankle and foot 02/06/2013    Orientation RESPIRATION BLADDER Height & Weight     Self  Normal Indwelling catheter Weight: 233 lb 11 oz (106 kg) Height:  5\' 4"  (162.6 cm)  BEHAVIORAL SYMPTOMS/MOOD NEUROLOGICAL BOWEL NUTRITION  STATUS      Incontinent Diet(See discharge summary)  AMBULATORY STATUS COMMUNICATION OF NEEDS Skin   Extensive Assist Verbally Surgical wounds, Skin abrasions(Staples on Head, w/ no dressing; MASD Groin, Skin barrier cream.)                       Personal Care Assistance Level of Assistance  Bathing, Feeding, Dressing Bathing Assistance: Maximum assistance Feeding assistance: Limited assistance Dressing Assistance: Maximum assistance     Functional Limitations Info  Sight, Hearing, Speech Sight Info: Adequate Hearing Info: Adequate Speech Info: Impaired(Slurred and Hoarse)    SPECIAL CARE FACTORS FREQUENCY  PT (By licensed PT), OT (By licensed OT), Speech therapy     PT Frequency: 5x week OT Frequency: 5x week     Speech Therapy Frequency: 5x week      Contractures Contractures Info: Not present    Additional Factors Info  Code Status, Allergies, Insulin Sliding Scale Code Status Info: Full Allergies Info: Asprin, Atorvastatin, Influenza vaccine, Atorvastatin, Canagliflozen, Statins.   Insulin Sliding Scale Info: Insulin Aspart (Novolog) 0-20 units every 4 hours. Insulin Glargine, Lantus, 18 units daily at bedtime.       Current Medications (11/08/2019):  This is the current hospital active medication list Current Facility-Administered Medications  Medication Dose Route Frequency Provider Last Rate Last Admin  . 0.9 %  sodium chloride infusion   Intravenous PRN Vallarie Mare, MD   Stopped at 11/04/19 2135  . acetaminophen (TYLENOL) tablet 650 mg  650 mg Oral Q4H PRN Vallarie Mare, MD  Or  . acetaminophen (TYLENOL) 160 MG/5ML solution 650 mg  650 mg Per Tube Q4H PRN Vallarie Mare, MD   650 mg at 10/30/19 2055   Or  . acetaminophen (TYLENOL) suppository 650 mg  650 mg Rectal Q4H PRN Vallarie Mare, MD   650 mg at 10/27/19 1403  . bisacodyl (DULCOLAX) suppository 10 mg  10 mg Rectal Daily PRN Vallarie Mare, MD   10 mg at 10/31/19 1019   . chlorhexidine (PERIDEX) 0.12 % solution 15 mL  15 mL Mouth Rinse BID Margaretha Seeds, MD   15 mL at 11/08/19 1045  . Chlorhexidine Gluconate Cloth 2 % PADS 6 each  6 each Topical Daily Consuella Lose, MD   6 each at 11/08/19 1046  . docusate (COLACE) 50 MG/5ML liquid 100 mg  100 mg Oral BID Vallarie Mare, MD   100 mg at 11/08/19 1046  . enoxaparin (LOVENOX) injection 60 mg  60 mg Subcutaneous Q24H Vallarie Mare, MD   60 mg at 11/07/19 2009  . gabapentin (NEURONTIN) 250 MG/5ML solution 600 mg  600 mg Oral Q8H Vallarie Mare, MD   600 mg at 11/08/19 0518  . haloperidol lactate (HALDOL) injection 5 mg  5 mg Intravenous Q6H PRN Vallarie Mare, MD      . hydrALAZINE (APRESOLINE) injection 10 mg  10 mg Intravenous Q8H PRN Margaretha Seeds, MD   10 mg at 11/03/19 1456  . insulin aspart (novoLOG) injection 0-20 Units  0-20 Units Subcutaneous Q4H Cristal Generous, NP   11 Units at 11/08/19 1235  . insulin glargine (LANTUS) injection 18 Units  18 Units Subcutaneous QHS Cristal Generous, NP   18 Units at 11/07/19 2139  . labetalol (NORMODYNE) injection 10-40 mg  10-40 mg Intravenous Q10 min PRN Vallarie Mare, MD   10 mg at 11/02/19 1725  . MEDLINE mouth rinse  15 mL Mouth Rinse q12n4p Margaretha Seeds, MD   15 mL at 11/08/19 1237  . ondansetron (ZOFRAN) injection 4 mg  4 mg Intravenous Q4H PRN Vallarie Mare, MD      . pantoprazole (PROTONIX) injection 40 mg  40 mg Intravenous Q24H Chesley Mires, MD   40 mg at 11/08/19 0810  . polyethylene glycol (MIRALAX / GLYCOLAX) packet 17 g  17 g Per Tube Daily PRN Vallarie Mare, MD   17 g at 10/30/19 1429  . QUEtiapine (SEROQUEL) tablet 25 mg  25 mg Oral QHS Vallarie Mare, MD   25 mg at 11/07/19 2138  . Resource ThickenUp Clear   Oral PRN Vallarie Mare, MD      . senna-docusate (Senokot-S) tablet 1 tablet  1 tablet Oral BID Vallarie Mare, MD   1 tablet at 11/07/19 2141  . sodium phosphate (FLEET) 7-19 GM/118ML enema  1 enema  1 enema Rectal Once PRN Vallarie Mare, MD         Discharge Medications: Please see discharge summary for a list of discharge medications.  Relevant Imaging Results:  Relevant Lab Results:   Additional Information SS# [id number]  Kirstie Peri, Student-Social Work

## 2019-11-08 NOTE — Progress Notes (Signed)
[facility]  PROGRESS NOTE  Tommy Green [id number] DOB: 1946/09/13 DOA: 10/24/2019 PCP: [name], DO   Brief HPI:   74 year old male who suffered blunt head trauma several weeks ago initially without changes on imaging however developed confusion, aphasia, mobility issues to the point where he could no longer perform ADLs.  Presented to [facility] on 10/24/2019 and CT head showed loculated left side SDH with mass-effect.  He was transferred to [facility] for neurosurgical evaluation.  On 10/25/2019 he was taken to the OR for craniotomy and evacuation of SDH.  PCCM was consulted for vent management postop however patient was extubated in PACU prior to transfer to ICU.  He was seen by Dr. Festus Holts on / 8 and signed off.  Patient had increased respiratory distress and altered mental status on 10/27/2019 and PCCM was reconsulted.  He completed 5 days Rocephin for H. influenzae pneumonia and PCCM signed off on 11/05/2019.  No further neurosurgical interventions. Patient transferred to [facility] service on 11/08/2019.    Subjective   Patient seen and examined, currently in restraints due to agitation and confusion.   Assessment/Plan:     1. Acute encephalopathy-secondary to hyperactive delirium in setting of SDH.  Patient is off Precedex, started on Seroquel.  Ammonia level 41. 2. Subdural hematoma s/p evacuation via craniotomy-neurosurgery following, continue Keppra 3. Acute respiratory failure with hypoxemia-required intubation H influenza pneumonia, he was extubated on 11/04/2019.  Completed 5 days of Rocephin for pneumonia. 4. Dysphagia-patient was on tube feeds, swallow evaluation obtained.  Patient started on dysphagia 1 diet. 5. Acute kidney injury on CKD stage II-resolved 6. Hypernatremia-sodium is worse today 154, encourage free water intake.  If no improvement by tomorrow we will consider starting D5W. 7. Diabetes mellitus type 2-switch sliding scale insulin to before every meal and  at bedtime, continue Lantus 18 units subcu daily.  Will adjust Lantus based on patient's oral intake.    SpO2: (P) 98 % O2 Flow Rate (L/min): 2 L/min FiO2 (%): 40 %    Lab Results  Component Value Date   SARSCOV2NAA NEGATIVE 10/24/2019     CBG: Recent Labs  Lab 11/07/19 2136 11/07/19 2334 11/08/19 0333 11/08/19 0800 11/08/19 1214  GLUCAP 254* 242* 154* 151* 278*    CBC: Recent Labs  Lab 11/04/19 0815 11/05/19 0643 11/06/19 0431 11/07/19 0310 11/08/19 0440  WBC 13.7* 16.0* 15.7* 14.2* 15.4*  HGB 7.0* 8.0* 8.0* 8.3* 8.7*  HCT 23.0* 26.5* 26.4* 27.0* 29.2*  MCV 86.1 87.5 85.2 85.4 86.4  PLT 250 330 299 324 0000000    Basic Metabolic Panel: Recent Labs  Lab 11/03/19 0522 11/03/19 1556 11/04/19 0815 11/05/19 0643 11/06/19 0431 11/07/19 0310 11/08/19 0440  NA 150*   < > 147* 148* 148* 146* 154*  K 4.2   < > 3.8 3.6 3.5 3.3* 3.6  CL 116*   < > 113* 111 111 110 114*  CO2 26   < > 25 24 27 26 28   GLUCOSE 242*   < > 250* 178* 274* 275* 143*  BUN 55*   < > 56* 43* 39* 32* 28*  CREATININE 1.27*   < > 1.44* 1.21 1.20 1.21 1.07  CALCIUM 8.7*   < > 8.5* 8.9 8.7* 8.4* 8.7*  MG 1.9  --   --   --   --   --   --   PHOS 2.4*  --   --   --   --   --   --    < > =  values in this interval not displayed.     Liver Function Tests: Recent Labs  Lab 11/02/19 0040  AST 19  ALT 24  ALKPHOS 53  BILITOT 0.4  PROT 5.7*  ALBUMIN 2.3*        DVT prophylaxis: Lovenox  Code Status: Full code  Family Communication: No family at bedside  Disposition Plan: likely home when medically ready for discharge         Scheduled medications:  . chlorhexidine  15 mL Mouth Rinse BID  . Chlorhexidine Gluconate Cloth  6 each Topical Daily  . docusate  100 mg Oral BID  . enoxaparin (LOVENOX) injection  60 mg Subcutaneous Q24H  . gabapentin  600 mg Oral Q8H  . insulin aspart  0-20 Units Subcutaneous TID WC  . insulin glargine  18 Units Subcutaneous QHS  . mouth rinse  15  mL Mouth Rinse q12n4p  . pantoprazole (PROTONIX) IV  40 mg Intravenous Q24H  . QUEtiapine  25 mg Oral QHS  . senna-docusate  1 tablet Oral BID    Consultants:  Neurosurgery  Procedures:  Craniotomy with evacuation of SDH  Antibiotics:   Anti-infectives (From admission, onward)   Start     Dose/Rate Route Frequency Ordered Stop   11/01/19 0900  cefTRIAXone (ROCEPHIN) 2 g in sodium chloride 0.9 % 100 mL IVPB     2 g 200 mL/hr over 30 Minutes Intravenous Every 24 hours 11/01/19 0855 11/05/19 2006   10/26/19 0000  ceFAZolin (ANCEF) IVPB 1 g/50 mL premix     1 g 100 mL/hr over 30 Minutes Intravenous Every 8 hours 10/25/19 2005 10/26/19 1754   10/25/19 1600  bacitracin 50,000 Units in sodium chloride 0.9 % 500 mL irrigation  Status:  Discontinued       As needed 10/25/19 1600 10/25/19 1825   10/25/19 0800  ceFAZolin (ANCEF) IVPB 2g/100 mL premix  Status:  Discontinued     2 g 200 mL/hr over 30 Minutes Intravenous 30 min pre-op 10/25/19 0459 10/25/19 2000       Objective   Vitals:   11/08/19 0331 11/08/19 0346 11/08/19 0757 11/08/19 1217  BP: (!) 144/87  130/61 (P) 126/70  Pulse:   70 (P) 89  Resp: 20  20   Temp: 98.2 F (36.8 C)  98.5 F (36.9 C) (P) 98.5 F (36.9 C)  TempSrc: Axillary  Axillary (P) Oral  SpO2: 97%  94% (P) 98%  Weight:  106 kg    Height:        Intake/Output Summary (Last 24 hours) at 11/08/2019 1448 Last data filed at 11/08/2019 1300 Gross per 24 hour  Intake 200 ml  Output 1550 ml  Net -1350 ml    01/19 1901 - 01/21 0700 In: 180 [P.O.:180] Out: 3225 [Urine:2725]  Filed Weights   11/03/19 0500 11/06/19 0422 11/08/19 0346  Weight: 112 kg 106.2 kg 106 kg    Physical Examination:    General: Appears agitated  Cardiovascular: S1-S2, regular, no murmur auscultated  Respiratory: Clear to auscultation bilaterally, no wheezing or crackles  Abdomen: Abdomen is soft, nontender, no organomegaly  Extremities: No edema in the lower  extremities  Neurologic: Alert, oriented to person, place, confused    Data Reviewed:   Recent Results (from the past 240 hour(s))  Culture, Urine     Status: None   Collection Time: 10/30/19  7:33 AM   Specimen: Urine, Random  Result Value Ref Range Status   Specimen Description URINE, RANDOM  Final  Special Requests NONE  Final   Culture   Final    NO GROWTH Performed at [facility], [place] [address], [place], [place] [address]    Report Status 10/31/2019 FINAL  Final  Culture, respiratory (non-expectorated)     Status: None   Collection Time: 10/30/19  7:33 AM   Specimen: Tracheal Aspirate; Respiratory  Result Value Ref Range Status   Specimen Description TRACHEAL ASPIRATE  Final   Special Requests NONE  Final   Gram Stain   Final    MODERATE WBC PRESENT,BOTH PMN AND MONONUCLEAR MODERATE GRAM NEGATIVE RODS RARE GRAM POSITIVE COCCI IN PAIRS AND CHAINS NO SQUAMOUS EPITHELIAL CELLS PRESENT    Culture   Final    FEW SERRATIA MARCESCENS ABUNDANT HAEMOPHILUS INFLUENZAE BETA LACTAMASE POSITIVE Performed at [facility], [place] [address], [place], [place] [address]    Report Status 11/05/2019 FINAL  Final   Organism ID, Bacteria SERRATIA MARCESCENS  Final      Susceptibility   Serratia marcescens - MIC*    CEFAZOLIN >=64 RESISTANT Resistant     CEFEPIME <=0.12 SENSITIVE Sensitive     CEFTAZIDIME <=1 SENSITIVE Sensitive     CEFTRIAXONE <=0.25 SENSITIVE Sensitive     CIPROFLOXACIN <=0.25 SENSITIVE Sensitive     GENTAMICIN <=1 SENSITIVE Sensitive     TRIMETH/SULFA <=20 SENSITIVE Sensitive     * FEW SERRATIA MARCESCENS  Culture, blood (routine x 2)     Status: None   Collection Time: 10/30/19  9:41 AM   Specimen: BLOOD  Result Value Ref Range Status   Specimen Description BLOOD BLOOD RIGHT HAND  Final   Special Requests AEROBIC BOTTLE ONLY Blood Culture adequate volume  Final   Culture   Final    NO GROWTH 5 DAYS Performed at [facility], [place]  [address], [place], [place] [address]    Report Status 11/04/2019 FINAL  Final  Culture, blood (routine x 2)     Status: None   Collection Time: 10/30/19  9:45 AM   Specimen: BLOOD  Result Value Ref Range Status   Specimen Description BLOOD BLOOD LEFT HAND  Final   Special Requests   Final    BOTTLES DRAWN AEROBIC AND ANAEROBIC Blood Culture adequate volume   Culture   Final    NO GROWTH 5 DAYS Performed at [facility], [place] [address], [place], [place] [address]    Report Status 11/04/2019 FINAL  Final    No results for input(s): LIPASE, AMYLASE in the last 168 hours. No results for input(s): AMMONIA in the last 168 hours.  Cardiac Enzymes: No results for input(s): CKTOTAL, CKMB, CKMBINDEX, TROPONINI in the last 168 hours. BNP (last 3 results) No results for input(s): BNP in the last 8760 hours.  ProBNP (last 3 results) No results for input(s): PROBNP in the last 8760 hours.  Studies:  DG Swallowing Func-Speech Pathology  Result Date: 11/07/2019 Objective Swallowing Evaluation: Type of Study: MBS-Modified Barium Swallow Study  Patient Details Name: Simao Saputo Flenniken MRN: [id number] Date of Birth: [birth year] Today's Date: 11/07/2019 Time: SLP Start Time (ACUTE ONLY): [id number] -SLP Stop Time (ACUTE ONLY): 1406 SLP Time Calculation (min) (ACUTE ONLY): 19 min Past Medical History: Past Medical History: Diagnosis Date . Anemia  . Essential hypertension 07/18/2017 . History of rheumatic fever  . History of stroke 07/18/2017 . Hypertriglyceridemia 07/18/2017 . Type 2 diabetes mellitus with diabetic neuropathy, unspecified ([place]) 07/18/2017 Past Surgical History: Past Surgical History: Procedure Laterality Date .  CRANIOTOMY Left 10/25/2019  Procedure: CRANIOTOMY HEMATOMA EVACUATION SUBDURAL;  Surgeon: Vallarie Mare, MD;  Location: [place];  Service: Neurosurgery;  Laterality: Left; . FOOT CAPSULOTOMY Left 07/25/2008  Mid Foot #2 MPJ . Hammertoe Repair Left 07/25/2008  #2 toe . SPINAL FUSION   . TARSAL TUNNEL  RELEASE Left 07/25/2008 HPI: Pt is a 74 y.o. male who has a PMH w prior CVA, diabetes, HLD, and HTN. He suffered blunt head trauma a few weeks back and had no concerning findings on imaging.  Subsequently developed confusion, aphasia, mobility problems to the point he could no longer perform any ADLs. He presented to [facility] ED 1/6 and CT head showed loculated AoC left SDH with mass effect. Pt is now post evacuation of SDH on 1/7.  ETT 1/7 for procedure and again 1/11-1/17. Swallow eval 1/8 recommended NPO due to mentation and moderate-severe oral deficits.  Subjective: awake, alert, appropriate Assessment / Plan / Recommendation CHL IP CLINICAL IMPRESSIONS 11/07/2019 Clinical Impression Pt has a mild-moderate oropharyngeal dysphagia with oral deficits likely related to acute neurological issues and cognition with pharyngeal phase suspect to be at least partly secondary to post-extubation changes. He has occasional oral holding but typically initiates manipulation and transit on his own. His lingual manipulation is weak though and there is lingual pumping, delayed transit, reduced bolus cohesion, and premature spillage of liquids. Mild lingual residue is noted, increasing as boluses become more solid. He uses a spontaneous, although somewhat slow, second swallow that clears oral and pharyngeal residue that sits in the valleculae > pyriform sinuses. Pharyngeally pt has reduced laryngeal vestibule closure, anterior hyolaryngeal excursion, and epiglottic deflection. Epiglottic deflection was improved with heavier boluses and may improve further if Cortrak can be removed. Thin liquids were the only consistency that entered his airway before and during the swallow with trace amount of silent aspiration noted. I would favor starting a little more cautiously with his diet (Dys 2 solids, nectar thick liquids) given that he has had fluctuating alertness and increased s/s of aspiration at bedside. SLP will f/u clinically for  potential to upgrade as mentation and vocal quality improve.  SLP Visit Diagnosis Dysphagia, oropharyngeal phase (R13.12) Attention and concentration deficit following -- Frontal lobe and executive function deficit following -- Impact on safety and function Moderate aspiration risk;Mild aspiration risk   CHL IP TREATMENT RECOMMENDATION 11/07/2019 Treatment Recommendations Therapy as outlined in treatment plan below   Prognosis 11/07/2019 Prognosis for Safe Diet Advancement Good Barriers to Reach Goals Cognitive deficits Barriers/Prognosis Comment -- CHL IP DIET RECOMMENDATION 11/07/2019 SLP Diet Recommendations Dysphagia 1 (Puree) solids;Nectar thick liquid Liquid Administration via Cup;Straw Medication Administration Whole meds with puree Compensations Minimize environmental distractions;Slow rate;Small sips/bites Postural Changes Seated upright at 90 degrees   CHL IP OTHER RECOMMENDATIONS 11/07/2019 Recommended Consults -- Oral Care Recommendations Oral care BID Other Recommendations Order thickener from pharmacy;Prohibited food (jello, ice cream, thin soups);Remove water pitcher;Have oral suction available   CHL IP FOLLOW UP RECOMMENDATIONS 11/07/2019 Follow up Recommendations Skilled Nursing facility   [facility] IP FREQUENCY AND DURATION 11/07/2019 Speech Therapy Frequency (ACUTE ONLY) min 2x/week Treatment Duration 2 weeks      CHL IP ORAL PHASE 11/07/2019 Oral Phase Impaired Oral - Pudding Teaspoon -- Oral - Pudding Cup -- Oral - Honey Teaspoon -- Oral - Honey Cup -- Oral - Nectar Teaspoon -- Oral - Nectar Cup Weak lingual manipulation;Lingual pumping;Reduced posterior propulsion;Delayed oral transit;Decreased bolus cohesion;Premature spillage;Lingual/palatal residue Oral - Nectar Straw Weak lingual manipulation;Lingual pumping;Reduced posterior propulsion;Delayed oral  transit;Decreased bolus cohesion;Premature spillage;Lingual/palatal residue Oral - Thin Teaspoon Weak lingual manipulation;Lingual pumping;Reduced posterior  propulsion;Delayed oral transit;Decreased bolus cohesion;Premature spillage Oral - Thin Cup Weak lingual manipulation;Lingual pumping;Reduced posterior propulsion;Delayed oral transit;Decreased bolus cohesion;Premature spillage;Holding of bolus;Lingual/palatal residue Oral - Thin Straw Weak lingual manipulation;Lingual pumping;Reduced posterior propulsion;Delayed oral transit;Decreased bolus cohesion;Premature spillage;Lingual/palatal residue Oral - Puree Weak lingual manipulation;Lingual pumping;Reduced posterior propulsion;Delayed oral transit;Decreased bolus cohesion;Lingual/palatal residue Oral - Mech Soft Weak lingual manipulation;Lingual pumping;Reduced posterior propulsion;Delayed oral transit;Decreased bolus cohesion;Impaired mastication;Lingual/palatal residue Oral - Regular -- Oral - Multi-Consistency -- Oral - Pill -- Oral Phase - Comment --  CHL IP PHARYNGEAL PHASE 11/07/2019 Pharyngeal Phase Impaired Pharyngeal- Pudding Teaspoon -- Pharyngeal -- Pharyngeal- Pudding Cup -- Pharyngeal -- Pharyngeal- Honey Teaspoon -- Pharyngeal -- Pharyngeal- Honey Cup -- Pharyngeal -- Pharyngeal- Nectar Teaspoon -- Pharyngeal -- Pharyngeal- Nectar Cup Reduced epiglottic inversion;Reduced anterior laryngeal mobility;Reduced airway/laryngeal closure Pharyngeal -- Pharyngeal- Nectar Straw Reduced epiglottic inversion;Reduced anterior laryngeal mobility;Reduced airway/laryngeal closure Pharyngeal -- Pharyngeal- Thin Teaspoon Reduced epiglottic inversion;Reduced anterior laryngeal mobility;Reduced airway/laryngeal closure Pharyngeal -- Pharyngeal- Thin Cup Reduced epiglottic inversion;Reduced anterior laryngeal mobility;Reduced airway/laryngeal closure;Penetration/Aspiration before swallow Pharyngeal Material enters airway, passes BELOW cords without attempt by patient to eject out (silent aspiration) Pharyngeal- Thin Straw Reduced epiglottic inversion;Reduced anterior laryngeal mobility;Reduced airway/laryngeal  closure;Penetration/Aspiration during swallow Pharyngeal Material enters airway, CONTACTS cords and not ejected out Pharyngeal- Puree Reduced epiglottic inversion;Reduced anterior laryngeal mobility;Reduced airway/laryngeal closure Pharyngeal -- Pharyngeal- Mechanical Soft Reduced epiglottic inversion;Reduced anterior laryngeal mobility;Reduced airway/laryngeal closure;Pharyngeal residue - valleculae;Pharyngeal residue - pyriform Pharyngeal -- Pharyngeal- Regular -- Pharyngeal -- Pharyngeal- Multi-consistency -- Pharyngeal -- Pharyngeal- Pill -- Pharyngeal -- Pharyngeal Comment --  CHL IP CERVICAL ESOPHAGEAL PHASE 11/07/2019 Cervical Esophageal Phase WFL Pudding Teaspoon -- Pudding Cup -- Honey Teaspoon -- Honey Cup -- Nectar Teaspoon -- Nectar Cup -- Nectar Straw -- Thin Teaspoon -- Thin Cup -- Thin Straw -- Puree -- Mechanical Soft -- Regular -- Multi-consistency -- Pill -- Cervical Esophageal Comment -- Osie Bond., M.A. [place] Pager [phone] Office [phone] 11/07/2019, 2:56 PM                Admission status: Inpatient: Based on patients clinical presentation and evaluation of above clinical data, I have made determination that patient meets Inpatient criteria at this time.   Oswald Hillock   [facility] If 7PM-7AM, please contact night-coverage at [email or web address], Office  [phone]  password [email or web address]  11/08/2019, 2:48 PM  LOS: 15 days

## 2019-11-08 NOTE — Progress Notes (Signed)
  Speech Language Pathology Treatment: Dysphagia  Patient Details Name: Tommy Green MRN: [id number] DOB: 09/28/1946 Today's Date: 11/08/2019 Time: 1211-1229 SLP Time Calculation (min) (ACUTE ONLY): 18 min  Assessment / Plan / Recommendation Clinical Impression  Patient seen at bedside for skilled ST. Patient sitting upright in chair, oriented to self and month. Patient with some verbosity throughout session/impulsivity noted. ST completed oral care with toothette via in-line suction. Pt attempting to talk throughout oral care completion. Pt seen with NTL via cup sips and puree boluses (small portions provided via spoon). Pt benefited from reduced environmental distractions. Pt with brief oral holding noted with some puree boluses (seemed due to distractions, such as people walking down the hall), but swallowing following verbal cues to attend to meal. Good oral clearance achieved. No overt s/s aspiration seen with dysphagia 1 solids or NTL. Recommend reduced environmental distractions, small bites/sips, full supervision and check for pocketed material. ST to follow as per POC.    HPI HPI: Pt is a 74 y.o. male who has a PMH w prior CVA, diabetes, HLD, and HTN. He suffered blunt head trauma a few weeks back and had no concerning findings on imaging.  Subsequently developed confusion, aphasia, mobility problems to the point he could no longer perform any ADLs. He presented to [facility] ED 1/6 and CT head showed loculated AoC left SDH with mass effect. Pt is now post evacuation of SDH on 1/7.  ETT 1/7 for procedure and again 1/11-1/17. Swallow eval 1/8 recommended NPO due to mentation and moderate-severe oral deficits.      SLP Plan  Continue with current plan of care       Recommendations  Diet recommendations: Dysphagia 1 (puree);Nectar-thick liquid Liquids provided via: Cup Medication Administration: Crushed with puree(or whole with puree as tolerated) Supervision: Full supervision/cueing for  compensatory strategies Compensations: Minimize environmental distractions;Slow rate;Small sips/bites Postural Changes and/or Swallow Maneuvers: Seated upright 90 degrees;Upright 30-60 min after meal                Oral Care Recommendations: Oral care QID Follow up Recommendations: Skilled Nursing facility;24 hour supervision/assistance SLP Visit Diagnosis: Dysphagia, oropharyngeal phase (R13.12) Plan: Continue with current plan of care       [place], M.Ed., [place] [facility] Acute Rehabilitation [phone]: Acute Rehab office [phone] - pager    Marina Goodell 11/08/2019, 12:38 PM

## 2019-11-08 NOTE — Progress Notes (Signed)
Subjective: Patient reports no headaches, n/v  Objective: Vital signs in last 24 hours: Temp:  [97.6 F (36.4 C)-99.1 F (37.3 C)] 98.5 F (36.9 C) (01/21 0757) Pulse Rate:  [70-90] 70 (01/21 0757) Resp:  [18-20] 20 (01/21 0757) BP: (120-167)/(61-94) 130/61 (01/21 0757) SpO2:  [94 %-98 %] 94 % (01/21 0757) FiO2 (%):  [2 %] 2 % (01/21 0757) Weight:  [106 kg] 106 kg (01/21 0346)  Intake/Output from previous day: 01/20 0701 - 01/21 0700 In: -  Out: 2050 [Urine:1550; Emesis/NG output:500] Intake/Output this shift: No intake/output data recorded.  Awake, alert, more lucid than yesterday.  Oriented to person, month, [place] confused. FC x4, minimal right sided drift.  Lab Results: Recent Labs    11/07/19 0310 11/08/19 0440  WBC 14.2* 15.4*  HGB 8.3* 8.7*  HCT 27.0* 29.2*  PLT 324 370   BMET Recent Labs    11/07/19 0310 11/08/19 0440  NA 146* 154*  K 3.3* 3.6  CL 110 114*  CO2 26 28  GLUCOSE 275* 143*  BUN 32* 28*  CREATININE 1.21 1.07  CALCIUM 8.4* 8.7*    Studies/Results: DG Swallowing Func-Speech Pathology  Result Date: 11/07/2019 Objective Swallowing Evaluation: Type of Study: MBS-Modified Barium Swallow Study  Patient Details Name: Tommy Green MRN: [id number] Date of Birth: February 07, 1946 Today's Date: 11/07/2019 Time: SLP Start Time (ACUTE ONLY): [id number] -SLP Stop Time (ACUTE ONLY): 1406 SLP Time Calculation (min) (ACUTE ONLY): 19 min Past Medical History: Past Medical History: Diagnosis Date . Anemia  . Essential hypertension 07/18/2017 . History of rheumatic fever  . History of stroke 07/18/2017 . Hypertriglyceridemia 07/18/2017 . Type 2 diabetes mellitus with diabetic neuropathy, unspecified ([place]) 07/18/2017 Past Surgical History: Past Surgical History: Procedure Laterality Date . CRANIOTOMY Left 10/25/2019  Procedure: CRANIOTOMY HEMATOMA EVACUATION SUBDURAL;  Surgeon: Vallarie Mare, MD;  Location: [place];  Service: Neurosurgery;  Laterality: Left; . FOOT  CAPSULOTOMY Left 07/25/2008  Mid Foot #2 MPJ . Hammertoe Repair Left 07/25/2008  #2 toe . SPINAL FUSION   . TARSAL TUNNEL RELEASE Left 07/25/2008 HPI: Pt is a 74 y.o. male who has a PMH w prior CVA, diabetes, HLD, and HTN. He suffered blunt head trauma a few weeks back and had no concerning findings on imaging.  Subsequently developed confusion, aphasia, mobility problems to the point he could no longer perform any ADLs. He [facility] ED 1/6 and CT head showed loculated AoC left SDH with mass effect. Pt is now post evacuation of SDH on 1/7.  ETT 1/7 for procedure and again 1/11-1/17. Swallow eval 1/8 recommended NPO due to mentation and moderate-severe oral deficits.  Subjective: awake, alert, appropriate Assessment / Plan / Recommendation CHL IP CLINICAL IMPRESSIONS 11/07/2019 Clinical Impression Pt has a mild-moderate oropharyngeal dysphagia with oral deficits likely related to acute neurological issues and cognition with pharyngeal phase suspect to be at least partly secondary to post-extubation changes. He has occasional oral holding but typically initiates manipulation and transit on his own. His lingual manipulation is weak though and there is lingual pumping, delayed transit, reduced bolus cohesion, and premature spillage of liquids. Mild lingual residue is noted, increasing as boluses become more solid. He uses a spontaneous, although somewhat slow, second swallow that clears oral and pharyngeal residue that sits in the valleculae > pyriform sinuses. Pharyngeally pt has reduced laryngeal vestibule closure, anterior hyolaryngeal excursion, and epiglottic deflection. Epiglottic deflection was improved with heavier boluses and may improve further if Cortrak can be removed. Thin  liquids were the only consistency that entered his airway before and during the swallow with trace amount of silent aspiration noted. I would favor starting a little more cautiously with his diet (Dys 2 solids, nectar thick liquids)  given that he has had fluctuating alertness and increased s/s of aspiration at bedside. SLP will f/u clinically for potential to upgrade as mentation and vocal quality improve.  SLP Visit Diagnosis Dysphagia, oropharyngeal phase (R13.12) Attention and concentration deficit following -- Frontal lobe and executive function deficit following -- Impact on safety and function Moderate aspiration risk;Mild aspiration risk   CHL IP TREATMENT RECOMMENDATION 11/07/2019 Treatment Recommendations Therapy as outlined in treatment plan below   Prognosis 11/07/2019 Prognosis for Safe Diet Advancement Good Barriers to Reach Goals Cognitive deficits Barriers/Prognosis Comment -- CHL IP DIET RECOMMENDATION 11/07/2019 SLP Diet Recommendations Dysphagia 1 (Puree) solids;Nectar thick liquid Liquid Administration via Cup;Straw Medication Administration Whole meds with puree Compensations Minimize environmental distractions;Slow rate;Small sips/bites Postural Changes Seated upright at 90 degrees   CHL IP OTHER RECOMMENDATIONS 11/07/2019 Recommended Consults -- Oral Care Recommendations Oral care BID Other Recommendations Order thickener from pharmacy;Prohibited food (jello, ice cream, thin soups);Remove water pitcher;Have oral suction available   CHL IP FOLLOW UP RECOMMENDATIONS 11/07/2019 Follow up Recommendations Skilled Nursing facility   [facility] IP FREQUENCY AND DURATION 11/07/2019 Speech Therapy Frequency (ACUTE ONLY) min 2x/week Treatment Duration 2 weeks      CHL IP ORAL PHASE 11/07/2019 Oral Phase Impaired Oral - Pudding Teaspoon -- Oral - Pudding Cup -- Oral - Honey Teaspoon -- Oral - Honey Cup -- Oral - Nectar Teaspoon -- Oral - Nectar Cup Weak lingual manipulation;Lingual pumping;Reduced posterior propulsion;Delayed oral transit;Decreased bolus cohesion;Premature spillage;Lingual/palatal residue Oral - Nectar Straw Weak lingual manipulation;Lingual pumping;Reduced posterior propulsion;Delayed oral transit;Decreased bolus  cohesion;Premature spillage;Lingual/palatal residue Oral - Thin Teaspoon Weak lingual manipulation;Lingual pumping;Reduced posterior propulsion;Delayed oral transit;Decreased bolus cohesion;Premature spillage Oral - Thin Cup Weak lingual manipulation;Lingual pumping;Reduced posterior propulsion;Delayed oral transit;Decreased bolus cohesion;Premature spillage;Holding of bolus;Lingual/palatal residue Oral - Thin Straw Weak lingual manipulation;Lingual pumping;Reduced posterior propulsion;Delayed oral transit;Decreased bolus cohesion;Premature spillage;Lingual/palatal residue Oral - Puree Weak lingual manipulation;Lingual pumping;Reduced posterior propulsion;Delayed oral transit;Decreased bolus cohesion;Lingual/palatal residue Oral - Mech Soft Weak lingual manipulation;Lingual pumping;Reduced posterior propulsion;Delayed oral transit;Decreased bolus cohesion;Impaired mastication;Lingual/palatal residue Oral - Regular -- Oral - Multi-Consistency -- Oral - Pill -- Oral Phase - Comment --  CHL IP PHARYNGEAL PHASE 11/07/2019 Pharyngeal Phase Impaired Pharyngeal- Pudding Teaspoon -- Pharyngeal -- Pharyngeal- Pudding Cup -- Pharyngeal -- Pharyngeal- Honey Teaspoon -- Pharyngeal -- Pharyngeal- Honey Cup -- Pharyngeal -- Pharyngeal- Nectar Teaspoon -- Pharyngeal -- Pharyngeal- Nectar Cup Reduced epiglottic inversion;Reduced anterior laryngeal mobility;Reduced airway/laryngeal closure Pharyngeal -- Pharyngeal- Nectar Straw Reduced epiglottic inversion;Reduced anterior laryngeal mobility;Reduced airway/laryngeal closure Pharyngeal -- Pharyngeal- Thin Teaspoon Reduced epiglottic inversion;Reduced anterior laryngeal mobility;Reduced airway/laryngeal closure Pharyngeal -- Pharyngeal- Thin Cup Reduced epiglottic inversion;Reduced anterior laryngeal mobility;Reduced airway/laryngeal closure;Penetration/Aspiration before swallow Pharyngeal Material enters airway, passes BELOW cords without attempt by patient to eject out (silent  aspiration) Pharyngeal- Thin Straw Reduced epiglottic inversion;Reduced anterior laryngeal mobility;Reduced airway/laryngeal closure;Penetration/Aspiration during swallow Pharyngeal Material enters airway, CONTACTS cords and not ejected out Pharyngeal- Puree Reduced epiglottic inversion;Reduced anterior laryngeal mobility;Reduced airway/laryngeal closure Pharyngeal -- Pharyngeal- Mechanical Soft Reduced epiglottic inversion;Reduced anterior laryngeal mobility;Reduced airway/laryngeal closure;Pharyngeal residue - valleculae;Pharyngeal residue - pyriform Pharyngeal -- Pharyngeal- Regular -- Pharyngeal -- Pharyngeal- Multi-consistency -- Pharyngeal -- Pharyngeal- Pill -- Pharyngeal -- Pharyngeal Comment --  CHL IP CERVICAL ESOPHAGEAL PHASE 11/07/2019 Cervical Esophageal Phase WFL Pudding Teaspoon -- Pudding Cup --  Honey Teaspoon -- Honey Cup -- Nectar Teaspoon -- Nectar Cup -- Nectar Straw -- Thin Teaspoon -- Thin Cup -- Thin Straw -- Puree -- Mechanical Soft -- Regular -- Multi-consistency -- Pill -- Cervical Esophageal Comment -- Osie Bond., M.A. [place] Acute Rehabilitation Services Pager [phone] Office [phone] 11/07/2019, 2:56 PM               Assessment/Plan: S/p craniotomy for evacuation of acute-on-chronic SDH 2 weeks ago.  Mental status continues to fluctuate but overall improved. -- hypernatremia, leukocytosis worse today, but glucoses improved.  Remains afebrile.  On room air.  Will defer to medical team-- appreciate them taking over his care. - presumed disposition is SNF.  He will need a f/u CT head without contrast in 2-4 weeks.    LOS: 15 days     Vallarie Mare 11/08/2019, 11:37 AM

## 2019-11-08 NOTE — Progress Notes (Signed)
Nutrition Follow-up  DOCUMENTATION CODES:   Morbid obesity  INTERVENTION:  Magic cup TID with meals, each supplement provides 290 kcal and 9 grams of protein   NUTRITION DIAGNOSIS:   Inadequate oral intake related to lethargy/confusion as evidenced by other (comment)(per RN report).   GOAL:   Patient will meet greater than or equal to 90% of their needs  Progressing.   MONITOR:   PO intake, Supplement acceptance, Labs, Weight trends, I & O's  REASON FOR ASSESSMENT:   Consult Enteral/tube feeding initiation and management  ASSESSMENT:   Pt with a PMH significant for DM, HTN, HLD, CVA. Pt suffered blunt head trauma several weeks ago and subsequently developed confusion, aphasia, and mobility problems to the point of inability to perform ADLs. Pt found to have loculated AoC left SDH with mass effect requiring craniotomy and evacuation of SDH.  1/7 craniotomy and evacuation of SDH, extubated in PACU, transferred to ICU 1/11 intubated  1/15 cortrak placed, tip gastric  1/17 extubated  1/20 Cortrak removed   RD spoke with pt who reports appetite was fine prior to admission, but reports not liking the food today. RD observed meal tray, pt completed ~50%. Pt reports home diet consists of bacon and eggs for breakfast and a ham/turkey and cheese sandwich with a handful of chips for both lunch and dinner. Pt reports liking to eat "lighter" foods.   Spoke with RN who reports pt did better with his meals yesterday and ate 100%. RN states pt is confused/agitated today and distrusting of staff so only ate ~50% of breakfast.   SLP performed MBS on 1/20; recommended Dysphagia 1 (puree) solids with nectar thick liquids. SLP made note that diet may change to Dysphagia 2 depending on pt level of alertness.  Medications reviewed and include: Colace 100mg  BID, SSI, Lantus 18 units daily, Senokot-S 1 tablet BID, Pro-stat 77mL daily (given PO)  Labs reviewed: Na 154 (H) CBGs: 151-348  UOP:  1,536mL x24 hours I/O: +7,582.17mL since admit  Diet Order:   Diet Order            DIET - DYS 1 Room service appropriate? Yes; Fluid consistency: Nectar Thick  Diet effective now              EDUCATION NEEDS:   Not appropriate for education at this time  Skin:  Skin Assessment: Reviewed RN Assessment  Last BM:  1/20 type 7  Height:   Ht Readings from Last 1 Encounters:  10/25/19 5\' 4"  (1.626 m)    Weight:   Wt Readings from Last 1 Encounters:  11/08/19 106 kg    Ideal Body Weight:  59.09 kg  BMI:  Body mass index is 40.11 kg/m.  Estimated Nutritional Needs:   Kcal:  2000-2200  Protein:  100-125 grams  Fluid:  2 L/day   Larkin Ina, MS, RD, LDN Pager: [phone] Weekend/After Hours Pager: [phone]

## 2019-11-08 NOTE — Progress Notes (Signed)
Physical Therapy Treatment Patient Details Name: Tommy Green MRN: [id number] DOB: 1946/03/06 Today's Date: 11/08/2019    History of Present Illness 74 yo male admitted with slurred speech and weakness and recent falls. chronic L SDH with mass effect (had blunt force trauma a few weeks PTA); craniotomy 10/25/19, intubated 1/11-extubated 11/04/19 PMH CVA DM with significant bil LE neuropathy HLD HTN CKD    PT Comments    Pt remains to present with confusion and required constant redirection to focus on tasks.  He remains to be a heavy +2 assistance.  Pt continues to have poor stability in standing with flexed knees.  He remains appropriate for snf at this time.      Follow Up Recommendations  SNF;Supervision for mobility/OOB     Equipment Recommendations       Recommendations for Other Services       Precautions / Restrictions Restrictions Weight Bearing Restrictions: No    Mobility  Bed Mobility Overal bed mobility: Needs Assistance Bed Mobility: Supine to Sit     Supine to sit: Mod assist;+2 for physical assistance     General bed mobility comments: multimodal cues to sit upright; pt leans to R, Use of bed pad to advance hips forward.  Transfers Overall transfer level: Needs assistance Equipment used: Ambulation equipment used(sara stedy.) Transfers: Sit to/from Stand Sit to Stand: Mod assist;+2 physical assistance;From elevated surface(max +2 from lower seated surface.)         General transfer comment: Pt performed with increased time and effort from elevated bed.  Pt required increased mod +2 from bed and max +2 from toilet.   Pt flexed forward with poor hip extension.  Ambulation/Gait             General Gait Details: NT   Stairs             Wheelchair Mobility    Modified Rankin (Stroke Patients Only)       Balance Overall balance assessment: Needs assistance   Sitting balance-Leahy Scale: Poor       Standing balance-Leahy  Scale: Poor Standing balance comment: reliant on BUE on RW                            Cognition Arousal/Alertness: Lethargic Behavior During Therapy: Flat affect Overall Cognitive Status: Impaired/Different from baseline                                 General Comments: Pt reporting that RN is trying to kill him and tied him up.  He was easily directed and re-oriented.  He did follow commands with increased time.  Pt very fatigued post session.      Exercises      General Comments        Pertinent Vitals/Pain Pain Assessment: No/denies pain Faces Pain Scale: No hurt    Home Living                      Prior Function            PT Goals (current goals can now be found in the care plan section) Acute Rehab PT Goals Patient Stated Goal: did not state Potential to Achieve Goals: Fair Progress towards PT goals: Progressing toward goals    Frequency    Min 3X/week      PT Plan Current plan remains  appropriate    Co-evaluation              AM-PAC PT "6 Clicks" Mobility   Outcome Measure  Help needed turning from your back to your side while in a flat bed without using bedrails?: A Lot Help needed moving from lying on your back to sitting on the side of a flat bed without using bedrails?: A Lot Help needed moving to and from a bed to a chair (including a wheelchair)?: Total Help needed standing up from a chair using your arms (e.g., wheelchair or bedside chair)?: Total Help needed to walk in hospital room?: Total Help needed climbing 3-5 steps with a railing? : Total 6 Click Score: 8    End of Session Equipment Utilized During Treatment: Gait belt Activity Tolerance: Patient limited by lethargy Patient left: in chair;with call bell/phone within reach;with chair alarm set Nurse Communication: Mobility status;Need for lift equipment PT Visit Diagnosis: Other abnormalities of gait and mobility (R26.89);Unsteadiness on feet  (R26.81);Repeated falls (R29.6);Muscle weakness (generalized) (M62.81);Pain;Other symptoms and signs involving the nervous system (R29.898);Hemiplegia and hemiparesis Hemiplegia - Right/Left: Right Hemiplegia - dominant/non-dominant: Dominant Hemiplegia - caused by: Nontraumatic SAH     Time: [id number] PT Time Calculation (min) (ACUTE ONLY): 38 min  Charges:  $Therapeutic Activity: 38-52 mins                     Erasmo Leventhal , PTA Acute Rehabilitation Services Pager [phone] Office [phone]     [name] [name] 11/08/2019, 2:35 PM

## 2019-11-08 NOTE — Progress Notes (Signed)
Patient still in restraints.  Bipap is contraindicated at this time for patient safety.  Will continue to monitor, no distress noted at this time.

## 2019-11-08 NOTE — Progress Notes (Signed)
Inpatient Diabetes Program Recommendations  AACE/ADA: New Consensus Statement on Inpatient Glycemic Control (2015)  Target Ranges:  Prepandial:   less than 140 mg/dL      Peak postprandial:   less than 180 mg/dL (1-2 hours)      Critically ill patients:  140 - 180 mg/dL   Lab Results  Component Value Date   GLUCAP 278 (H) 11/08/2019   HGBA1C 7.5 (H) 10/25/2019    Review of Glycemic Control Results for Tommy Green, Tommy Green (MRN [id number]) as of 11/08/2019 13:54  Ref. Range 11/07/2019 21:36 11/07/2019 23:34 11/08/2019 03:33 11/08/2019 08:00 11/08/2019 12:14  Glucose-Capillary Latest Ref Range: 70 - 99 mg/dL 254 (H) 242 (H) 154 (H) 151 (H) 278 (H)  Diabetes history: DM 2 Outpatient Diabetes medications:  Novolog 75 units daily, Tresiba 60 units q HS Metformin 500 mg bid Current orders for Inpatient glycemic control:  Novolog resistant q 4 hours Lantus 18 units q HS  Inpatient Diabetes Program Recommendations:    Note patient off tube feeds and on Dys. 1 DIET. Please reduce Novolog correction to tid with meals and HS scale.  Also consider increasing Lantus to 30 units daily.   Thanks,  Adah Perl, RN, BC-ADM Inpatient Diabetes Coordinator Pager [phone] (8a-5p)

## 2019-11-08 NOTE — Plan of Care (Signed)

## 2019-11-08 NOTE — Progress Notes (Signed)
Inpatient Rehabilitation-Admissions Coordinator   Met with pt beside as follow up from PM&R consult. Pt was able to assist with scooting back in chair but continued to show safety awareness deficits when doing so and was impulsive and restless. Discussed rehab program with pt who does not want to stay in the hospital for rehab at this time. He gave me permission to speak with his wife regarding program recommendation.   Spoke with his wife [name] via phone and discussed program length and anticipated assist needed at DC. Pt's wife mostly concerned about his behavioral issues and impulsivity as she feels that will be the most difficult to manage at home. We reviewed differences between SNF and CIR programs. Pt's wife also concerned about cost of rehab as she states she has been in contact with [place] regarding his insurance benefits and is not sure what she can do.  Pt's wife appeared conflicted during conversation. Notified wife of rehab brochures and pamphlet left at bedside for her to look at this evening when she visits. Plan to follow up with pt's wife tomorrow morning regarding her final decision on rehab venue.  Raechel Ache, OTR/L  Rehab Admissions Coordinator  [phone] 11/08/2019 2:55 PM

## 2019-11-08 NOTE — Consult Note (Signed)
Physical Medicine and Rehabilitation Consult   Reason for Consult: TBI  Referring Physician: Dr. Darrick Meigs   HPI: Tommy Green is a 74 y.o. male with history of HTN, CVA, T2DM with neuropathy and balance disorder who sustained a fall a week PTA on 10/24/19 with onset of confusion, slurred speech with word finding deficits, right sided weakness and CT head done by PCP revealing large acute on subacute left frontal lobe SDH with mass effect and left to right shift. He was sent to ED and underwent left crani for evacuation of SDH. Post op with lethargy and EEG done revealing moderate to severe encephalopathy.  He had progressive decline in neurological status with obtundation --follow up CT head showed no new changes. He developed respiratory distress requiring intubation on 01/11 and sputum cultures showed H flu and serratia. He was started on rocephin for H flu PNA and hypernatremia with acute on chronic renal failure improving with hydration. He tolerated extubation by 01/17 and continues to have bouts of confusion with agitation. Therapy evaluations completed revealing significant deficits and MD/Family requesting CIR due to functional decline.      Review of Systems  Constitutional: Negative for fever.  HENT: Negative for hearing loss.   Respiratory: Negative for cough, shortness of breath and stridor.   Cardiovascular: Negative for chest pain.  Gastrointestinal: Negative for abdominal pain.  Musculoskeletal: Positive for falls and myalgias.  Skin: Negative for rash.  Neurological: Positive for sensory change and weakness. Negative for headaches.  Psychiatric/Behavioral: Positive for memory loss.     Past Medical History:  Diagnosis Date  . Anemia   . Essential hypertension 07/18/2017  . History of rheumatic fever   . History of stroke 07/18/2017  . Hypertriglyceridemia 07/18/2017  . Type 2 diabetes mellitus with diabetic neuropathy, unspecified ([employer]) 07/18/2017    Past Surgical  History:  Procedure Laterality Date  . CRANIOTOMY Left 10/25/2019   Procedure: CRANIOTOMY HEMATOMA EVACUATION SUBDURAL;  Surgeon: Vallarie Mare, MD;  Location: [facility];  Service: Neurosurgery;  Laterality: Left;  . FOOT CAPSULOTOMY Left 07/25/2008   Mid Foot #2 MPJ  . Hammertoe Repair Left 07/25/2008   #2 toe  . SPINAL FUSION    . TARSAL TUNNEL RELEASE Left 07/25/2008    Family History  Problem Relation Age of Onset  . Hyperlipidemia Mother   . Hyperlipidemia Father   . Cancer Neg Hx     Social History:  Married. Independent with walker PTA. Per reports that he has quit smoking. His smoking use included cigarettes. He has never used smokeless tobacco. He reports that he drinks beer twice a year. He does not use drugs.    Allergies  Allergen Reactions  . Influenza Vaccines Other (See Comments)  . Aspirin     Other reaction(s): Other (See Comments) Gastroesophageal reflux Unspecified    . Atorvastatin Other (See Comments)    Myalgias GI upset Unspecified    . Canagliflozin     Other reaction(s): Other (See Comments) Pt reports that the use of Invokana caused acute kidney failure  Unspecified    . Statins     Other reaction(s): Myalgias (intolerance)   Medications Prior to Admission  Medication Sig Dispense Refill  . carvedilol (COREG) 12.5 MG tablet TAKE 1 TABLET TWICE DAILY  WITH MEALS (Patient taking differently: Take 12.5 mg by mouth 2 (two) times daily with a meal. ) 180 tablet 1  . fenofibrate micronized (LOFIBRA) 200 MG capsule TAKE 1 CAPSULE DAILY BEFOREBREAKFAST (Patient  taking differently: Take 200 mg by mouth daily before breakfast. ) 90 capsule 3  . gabapentin (NEURONTIN) 600 MG tablet TAKE 1 TABLET 3 TIMES A DAY (Patient taking differently: Take 600 mg by mouth 3 (three) times daily. ) 270 tablet 1  . hydrochlorothiazide (HYDRODIURIL) 25 MG tablet TAKE 1 TABLET DAILY (Patient taking differently: Take 25 mg by mouth daily. ) 90 tablet 3  . Insulin Aspart  (NOVOLOG [place]) Inject 75 Units into the skin daily as needed. Sliding scale. Patient or spouse not aware of exact sliding scale    . insulin degludec (TRESIBA) 100 UNIT/ML SOPN FlexTouch Pen Inject 0.6 mLs (60 Units total) into the skin at bedtime. 5 pen 2  . lisinopril (ZESTRIL) 40 MG tablet TAKE 1 TABLET DAILY (Patient taking differently: Take 40 mg by mouth daily. ) 90 tablet 3  . lovastatin (MEVACOR) 10 MG tablet Take 1 tablet (10 mg total) by mouth at bedtime. 30 tablet 3  . metFORMIN (GLUCOPHAGE) 500 MG tablet Take 500 mg by mouth 2 (two) times daily with a meal.    . Multiple Vitamins-Minerals (MULTIVITAMIN WITH MINERALS) tablet Take 1 tablet by mouth daily.    . vitamin B-12 (CYANOCOBALAMIN) 1000 MCG tablet Take 1,000 mcg by mouth daily.      Home: Home Living Family/patient expects to be discharged to:: Skilled nursing facility Living Arrangements: Spouse/significant other Available Help at Discharge: Family(not all of the time; she has back issues ) Type of Home: House Home Access: Stairs to enter [employer] of Steps: 3 Entrance Stairs-Rails: Right Home Layout: One level Bathroom Shower/Tub: Chiropodist: Standard Bathroom Accessibility: Yes Home Equipment: Environmental consultant - 2 wheels, Cane - quad, Wheelchair - manual  Functional History: Prior Function Level of Independence: Independent Comments: used walker in few days PTA due to increasing weakness; cane intermittently but mostly ambulated without an AD Functional Status:  Mobility: Bed Mobility Overal bed mobility: Needs Assistance Bed Mobility: Supine to Sit Supine to sit: Mod assist, +2 for physical assistance, HOB elevated General bed mobility comments: multimodal cues to sit upright; pt leans to R Transfers Overall transfer level: Needs assistance Equipment used: Rolling walker (2 wheeled), 2 person hand held assist Transfers: Sit to/from Stand, Stand Pivot Transfers Sit to Stand: Mod assist,  +2 physical assistance, From elevated surface Stand pivot transfers: Mod assist, +2 physical assistance, From elevated surface General transfer comment: mod assist +2 for walker management and boost to stand (never fully extending hips/knees however able to stand ~2 minutes for pericare); max cues for sequencing with RW with rt hand poor ability to grasp handle; for pivot removed RW and 2 person assist from in front of pt with max support to rt knee due to buckling      ADL: ADL Overall ADL's : Needs assistance/impaired Eating/Feeding: NPO Grooming: Minimal assistance, Sitting Upper Body Bathing: Moderate assistance, Sitting Lower Body Bathing: Maximal assistance, Sitting/lateral leans, +2 for physical assistance, +2 for safety/equipment, Sit to/from stand Lower Body Bathing Details (indicate cue type and reason): Assist in standing +2 after BM in bed; pt standing for task, but unable to assist Upper Body Dressing : Moderate assistance, Sitting Lower Body Dressing: Maximal assistance, +2 for physical assistance, +2 for safety/equipment, Cueing for safety, Sitting/lateral leans, Sit to/from stand Toilet Transfer: Moderate assistance, Squat-pivot, Cueing for safety, Cueing for sequencing Toilet Transfer Details (indicate cue type and reason): took a few steps from bed to recliner Toileting- Clothing Manipulation and Hygiene: Maximal assistance, Sit to/from stand  Functional mobility during ADLs: Moderate assistance, +2 for physical assistance, +2 for safety/equipment, Rolling walker, Cueing for safety, Cueing for sequencing General ADL Comments: Pt limited by poor strength, poor mobility, decreased activity tolerance, and decreased ability to care for self.  Cognition: Cognition Overall Cognitive Status: Impaired/Different from baseline Orientation Level: Oriented to person, Disoriented to situation, Disoriented to place, Oriented to time Cognition Arousal/Alertness: Lethargic Behavior  During Therapy: Flat affect Overall Cognitive Status: Impaired/Different from baseline Area of Impairment: Attention, Memory, Following commands, Safety/judgement, Awareness, Problem solving, Orientation Orientation Level: Disoriented to, Situation(Time NT; initially stated in hospital for back problem;) Current Attention Level: Sustained Memory: Decreased short-term memory, Decreased recall of precautions Following Commands: Follows one step commands inconsistently, Follows one step commands with increased time Safety/Judgement: Decreased awareness of safety, Decreased awareness of deficits Awareness: Intellectual Problem Solving: Slow processing, Decreased initiation, Difficulty sequencing, Requires verbal cues, Requires tactile cues General Comments: decreased arousal requiring multimodal cues for command follow. Pt unable to keep eyes open for >10 secs at a time.   Blood pressure (P) 126/70, pulse (P) 89, temperature (P) 98.5 F (36.9 C), temperature source (P) Oral, resp. rate 20, height 5\' 4"  (1.626 m), weight 106 kg, SpO2 (P) 98 %.  Physical Exam  Constitutional: He appears well-developed and well-nourished.  Sitting up in a chair and NAD.   General: Alert and oriented x 3, No apparent distress HENT: Left crani incision C/D/I and healing well  Neck: Supple without JVD or lymphadenopathy Heart: Reg rate and rhythm. No murmurs rubs or gallops Chest: CTA bilaterally without wheezes, rales, or rhonchi; no distress. Stridor present.  Abdomen: Soft, non-tender, non-distended, bowel sounds positive. Extremities: No clubbing, cyanosis, or edema. Pulses are 2+ Skin: Clean and intact without signs of breakdown Neuro:Left facial weakness with dysphonia. Oriented to self, place, age,DOB. He was able to follow simple one step motor commands.   Musculoskeletal: Full ROM, No pain with AROM or PROM in the neck, trunk, or extremities. Posture appropriate Psych: Pt's affect is appropriate. Pt is  cooperative  Results for orders placed or performed during the hospital encounter of 10/24/19 (from the past 24 hour(s))  Glucose, capillary     Status: Abnormal   Collection Time: 11/07/19  4:23 PM  Result Value Ref Range   Glucose-Capillary 348 (H) 70 - 99 mg/dL  Glucose, capillary     Status: Abnormal   Collection Time: 11/07/19  7:40 PM  Result Value Ref Range   Glucose-Capillary 314 (H) 70 - 99 mg/dL  Glucose, capillary     Status: Abnormal   Collection Time: 11/07/19  9:36 PM  Result Value Ref Range   Glucose-Capillary 254 (H) 70 - 99 mg/dL  Glucose, capillary     Status: Abnormal   Collection Time: 11/07/19 11:34 PM  Result Value Ref Range   Glucose-Capillary 242 (H) 70 - 99 mg/dL  Glucose, capillary     Status: Abnormal   Collection Time: 11/08/19  3:33 AM  Result Value Ref Range   Glucose-Capillary 154 (H) 70 - 99 mg/dL  CBC     Status: Abnormal   Collection Time: 11/08/19  4:40 AM  Result Value Ref Range   WBC 15.4 (H) 4.0 - 10.5 K/uL   RBC 3.38 (L) 4.22 - 5.81 MIL/uL   Hemoglobin 8.7 (L) 13.0 - 17.0 g/dL   HCT 29.2 (L) 39.0 - 52.0 %   MCV 86.4 80.0 - 100.0 fL   MCH 25.7 (L) 26.0 - 34.0 pg   MCHC 29.8 (  L) 30.0 - 36.0 g/dL   RDW 15.3 11.5 - 15.5 %   Platelets 370 150 - 400 K/uL   nRBC 0.0 0.0 - 0.2 %  Basic metabolic panel     Status: Abnormal   Collection Time: 11/08/19  4:40 AM  Result Value Ref Range   Sodium 154 (H) 135 - 145 mmol/L   Potassium 3.6 3.5 - 5.1 mmol/L   Chloride 114 (H) 98 - 111 mmol/L   CO2 28 22 - 32 mmol/L   Glucose, Bld 143 (H) 70 - 99 mg/dL   BUN 28 (H) 8 - 23 mg/dL   Creatinine, Ser 1.07 0.61 - 1.24 mg/dL   Calcium 8.7 (L) 8.9 - 10.3 mg/dL   GFR calc non Af Amer >60 >60 mL/min   GFR calc Af Amer >60 >60 mL/min   Anion gap 12 5 - 15  Glucose, capillary     Status: Abnormal   Collection Time: 11/08/19  8:00 AM  Result Value Ref Range   Glucose-Capillary 151 (H) 70 - 99 mg/dL  Glucose, capillary     Status: Abnormal   Collection  Time: 11/08/19 12:14 PM  Result Value Ref Range   Glucose-Capillary 278 (H) 70 - 99 mg/dL   DG Swallowing Func-Speech Pathology  Result Date: 11/07/2019 Objective Swallowing Evaluation: Type of Study: MBS-Modified Barium Swallow Study  Patient Details Name: Tommy Green MRN: [id number] Date of Birth: Apr 21, 1946 Today's Date: 11/07/2019 Time: SLP Start Time (ACUTE ONLY): [id number] -SLP Stop Time (ACUTE ONLY): 1406 SLP Time Calculation (min) (ACUTE ONLY): 19 min Past Medical History: Past Medical History: Diagnosis Date . Anemia  . Essential hypertension 07/18/2017 . History of rheumatic fever  . History of stroke 07/18/2017 . Hypertriglyceridemia 07/18/2017 . Type 2 diabetes mellitus with diabetic neuropathy, unspecified ([place]) 07/18/2017 Past Surgical History: Past Surgical History: Procedure Laterality Date . CRANIOTOMY Left 10/25/2019  Procedure: CRANIOTOMY HEMATOMA EVACUATION SUBDURAL;  Surgeon: Vallarie Mare, MD;  Location: [facility];  Service: Neurosurgery;  Laterality: Left; . FOOT CAPSULOTOMY Left 07/25/2008  Mid Foot #2 MPJ . Hammertoe Repair Left 07/25/2008  #2 toe . SPINAL FUSION   . TARSAL TUNNEL RELEASE Left 07/25/2008 HPI: Pt is a 74 y.o. male who has a PMH w prior CVA, diabetes, HLD, and HTN. He suffered blunt head trauma a few weeks back and had no concerning findings on imaging.  Subsequently developed confusion, aphasia, mobility problems to the point he could no longer perform any ADLs. He [facility] to [facility] ED 1/6 and CT head showed loculated AoC left SDH with mass effect. Pt is now post evacuation of SDH on 1/7.  ETT 1/7 for procedure and again 1/11-1/17. Swallow eval 1/8 recommended NPO due to mentation and moderate-severe oral deficits.  Subjective: awake, alert, appropriate Assessment / Plan / Recommendation CHL IP CLINICAL IMPRESSIONS 11/07/2019 Clinical Impression Pt has a mild-moderate oropharyngeal dysphagia with oral deficits likely related to acute neurological issues and cognition with  pharyngeal phase suspect to be at least partly secondary to post-extubation changes. He has occasional oral holding but typically initiates manipulation and transit on his own. His lingual manipulation is weak though and there is lingual pumping, delayed transit, reduced bolus cohesion, and premature spillage of liquids. Mild lingual residue is noted, increasing as boluses become more solid. He uses a spontaneous, although somewhat slow, second swallow that clears oral and pharyngeal residue that sits in the valleculae > pyriform sinuses. Pharyngeally pt has reduced laryngeal vestibule closure, anterior hyolaryngeal excursion, and epiglottic deflection.  Epiglottic deflection was improved with heavier boluses and may improve further if Cortrak can be removed. Thin liquids were the only consistency that entered his airway before and during the swallow with trace amount of silent aspiration noted. I would favor starting a little more cautiously with his diet (Dys 2 solids, nectar thick liquids) given that he has had fluctuating alertness and increased s/s of aspiration at bedside. SLP will f/u clinically for potential to upgrade as mentation and vocal quality improve.  SLP Visit Diagnosis Dysphagia, oropharyngeal phase (R13.12) Attention and concentration deficit following -- Frontal lobe and executive function deficit following -- Impact on safety and function Moderate aspiration risk;Mild aspiration risk   CHL IP TREATMENT RECOMMENDATION 11/07/2019 Treatment Recommendations Therapy as outlined in treatment plan below   Prognosis 11/07/2019 Prognosis for Safe Diet Advancement Good Barriers to Reach Goals Cognitive deficits Barriers/Prognosis Comment -- CHL IP DIET RECOMMENDATION 11/07/2019 SLP Diet Recommendations Dysphagia 1 (Puree) solids;Nectar thick liquid Liquid Administration via Cup;Straw Medication Administration Whole meds with puree Compensations Minimize environmental distractions;Slow rate;Small sips/bites  Postural Changes Seated upright at 90 degrees   CHL IP OTHER RECOMMENDATIONS 11/07/2019 Recommended Consults -- Oral Care Recommendations Oral care BID Other Recommendations Order thickener from pharmacy;Prohibited food (jello, ice cream, thin soups);Remove water pitcher;Have oral suction available   CHL IP FOLLOW UP RECOMMENDATIONS 11/07/2019 Follow up Recommendations Skilled Nursing facility   [facility] IP FREQUENCY AND DURATION 11/07/2019 Speech Therapy Frequency (ACUTE ONLY) min 2x/week Treatment Duration 2 weeks      CHL IP ORAL PHASE 11/07/2019 Oral Phase Impaired Oral - Pudding Teaspoon -- Oral - Pudding Cup -- Oral - Honey Teaspoon -- Oral - Honey Cup -- Oral - Nectar Teaspoon -- Oral - Nectar Cup Weak lingual manipulation;Lingual pumping;Reduced posterior propulsion;Delayed oral transit;Decreased bolus cohesion;Premature spillage;Lingual/palatal residue Oral - Nectar Straw Weak lingual manipulation;Lingual pumping;Reduced posterior propulsion;Delayed oral transit;Decreased bolus cohesion;Premature spillage;Lingual/palatal residue Oral - Thin Teaspoon Weak lingual manipulation;Lingual pumping;Reduced posterior propulsion;Delayed oral transit;Decreased bolus cohesion;Premature spillage Oral - Thin Cup Weak lingual manipulation;Lingual pumping;Reduced posterior propulsion;Delayed oral transit;Decreased bolus cohesion;Premature spillage;Holding of bolus;Lingual/palatal residue Oral - Thin Straw Weak lingual manipulation;Lingual pumping;Reduced posterior propulsion;Delayed oral transit;Decreased bolus cohesion;Premature spillage;Lingual/palatal residue Oral - Puree Weak lingual manipulation;Lingual pumping;Reduced posterior propulsion;Delayed oral transit;Decreased bolus cohesion;Lingual/palatal residue Oral - Mech Soft Weak lingual manipulation;Lingual pumping;Reduced posterior propulsion;Delayed oral transit;Decreased bolus cohesion;Impaired mastication;Lingual/palatal residue Oral - Regular -- Oral - Multi-Consistency  -- Oral - Pill -- Oral Phase - Comment --  CHL IP PHARYNGEAL PHASE 11/07/2019 Pharyngeal Phase Impaired Pharyngeal- Pudding Teaspoon -- Pharyngeal -- Pharyngeal- Pudding Cup -- Pharyngeal -- Pharyngeal- Honey Teaspoon -- Pharyngeal -- Pharyngeal- Honey Cup -- Pharyngeal -- Pharyngeal- Nectar Teaspoon -- Pharyngeal -- Pharyngeal- Nectar Cup Reduced epiglottic inversion;Reduced anterior laryngeal mobility;Reduced airway/laryngeal closure Pharyngeal -- Pharyngeal- Nectar Straw Reduced epiglottic inversion;Reduced anterior laryngeal mobility;Reduced airway/laryngeal closure Pharyngeal -- Pharyngeal- Thin Teaspoon Reduced epiglottic inversion;Reduced anterior laryngeal mobility;Reduced airway/laryngeal closure Pharyngeal -- Pharyngeal- Thin Cup Reduced epiglottic inversion;Reduced anterior laryngeal mobility;Reduced airway/laryngeal closure;Penetration/Aspiration before swallow Pharyngeal Material enters airway, passes BELOW cords without attempt by patient to eject out (silent aspiration) Pharyngeal- Thin Straw Reduced epiglottic inversion;Reduced anterior laryngeal mobility;Reduced airway/laryngeal closure;Penetration/Aspiration during swallow Pharyngeal Material enters airway, CONTACTS cords and not ejected out Pharyngeal- Puree Reduced epiglottic inversion;Reduced anterior laryngeal mobility;Reduced airway/laryngeal closure Pharyngeal -- Pharyngeal- Mechanical Soft Reduced epiglottic inversion;Reduced anterior laryngeal mobility;Reduced airway/laryngeal closure;Pharyngeal residue - valleculae;Pharyngeal residue - pyriform Pharyngeal -- Pharyngeal- Regular -- Pharyngeal -- Pharyngeal- Multi-consistency -- Pharyngeal -- Pharyngeal- Pill -- Pharyngeal -- Pharyngeal Comment --  CHL IP CERVICAL ESOPHAGEAL PHASE 11/07/2019 Cervical Esophageal Phase WFL Pudding Teaspoon -- Pudding Cup -- Honey Teaspoon -- Honey Cup -- Nectar Teaspoon -- Nectar Cup -- Nectar Straw -- Thin Teaspoon -- Thin Cup -- Thin Straw -- Puree --  Mechanical Soft -- Regular -- Multi-consistency -- Pill -- Cervical Esophageal Comment -- Osie Bond., M.A. [facility] Pager [phone] Office [phone] 11/07/2019, 2:56 PM                Assessment/Plan: Diagnosis: SDH s/p evacuation 1. Does the need for close, 24 hr/day medical supervision in concert with the patient's rehab needs make it unreasonable for this patient to be served in a less intensive setting? Yes 2. Co-Morbidities requiring supervision/potential complications: respiratory failure, acute encephalopathy, hypernatremia, dyspnea, AKI on CKD 3. Due to bladder management, bowel management, safety, skin/wound care, disease management, medication administration, pain management and patient education, does the patient require 24 hr/day rehab nursing? Yes 4. Does the patient require coordinated care of a physician, rehab nurse, therapy disciplines of PT, OT, SLP to address physical and functional deficits in the context of the above medical diagnosis(es)? Yes Addressing deficits in the following areas: balance, endurance, locomotion, strength, transferring, bowel/bladder control, bathing, dressing, feeding, grooming, toileting, cognition, swallowing and psychosocial support 5. Can the patient actively participate in an intensive therapy program of at least 3 hrs of therapy per day at least 5 days per week? Yes 6. The potential for patient to make measurable gains while on inpatient rehab is excellent 7. Anticipated functional outcomes upon discharge from inpatient rehab are min assist  with PT, min assist with OT, min assist with SLP. 8. Estimated rehab length of stay to reach the above functional goals is: 16-18 days 9. Anticipated discharge destination: Home 10. Overall Rehab/Functional Prognosis: excellent  RECOMMENDATIONS: This patient's condition is appropriate for continued rehabilitative care in the following setting: CIR Patient has agreed to  participate in recommended program. Yes Note that insurance prior authorization may be required for reimbursement for recommended care.  Comment: Patient has significant impairments and has demonstrated good tolerance for therapy. He would benefit from CIR to reach goal of MinA if his wife is willing and able to care for him at this level at home.  Bary Leriche, PA-C 11/08/2019   I have personally performed a face to face diagnostic evaluation, including, but not limited to relevant history and physical exam findings, of this patient and developed relevant assessment and plan.  Additionally, I have reviewed and concur with the physician assistant's documentation above.  Leeroy Cha, MD

## 2019-11-09 LAB — BASIC METABOLIC PANEL
Anion gap: 12 (ref 5–15)
BUN: 34 mg/dL — ABNORMAL HIGH (ref 8–23)
CO2: 25 mmol/L (ref 22–32)
Calcium: 8.6 mg/dL — ABNORMAL LOW (ref 8.9–10.3)
Chloride: 115 mmol/L — ABNORMAL HIGH (ref 98–111)
Creatinine, Ser: 1.24 mg/dL (ref 0.61–1.24)
GFR calc Af Amer: >60 mL/min (ref >60)
GFR calc non Af Amer: 57 mL/min — ABNORMAL LOW (ref >60)
Glucose, Bld: 191 mg/dL — ABNORMAL HIGH (ref 70–99)
Potassium: 3.5 mmol/L (ref 3.5–5.1)
Sodium: 152 mmol/L — ABNORMAL HIGH (ref 135–145)

## 2019-11-09 LAB — GLUCOSE, CAPILLARY
Glucose-Capillary: 216 mg/dL — ABNORMAL HIGH (ref 70–99)
Glucose-Capillary: 247 mg/dL — ABNORMAL HIGH (ref 70–99)
Glucose-Capillary: 275 mg/dL — ABNORMAL HIGH (ref 70–99)
Glucose-Capillary: 221 mg/dL — ABNORMAL HIGH (ref 70–99)

## 2019-11-09 MED ORDER — INSULIN GLARGINE 100 UNIT/ML ~~LOC~~ SOLN
25.0000 [IU] | Freq: Every day | SUBCUTANEOUS | Status: DC
  Administered 2019-11-09: 25 [IU] via SUBCUTANEOUS
  Filled 2019-11-09 (×2): qty 0.25

## 2019-11-09 NOTE — Progress Notes (Signed)
Subjective: Patient reports no headaches.  Objective: Vital signs in last 24 hours: Temp:  [97.5 F (36.4 C)-100 F (37.8 C)] 100 F (37.8 C) (01/22 0822) Pulse Rate:  [73-111] 111 (01/22 0822) Resp:  [18-27] 26 (01/22 0822) BP: (126-157)/(70-96) 143/96 (01/22 0822) SpO2:  [85 %-98 %] 90 % (01/22 0822) FiO2 (%):  [21 %] 21 % (01/22 0822)  Intake/Output from previous day: 01/21 0701 - 01/22 0700 In: 200 [P.O.:200] Out: 1300 [Urine:1300] Intake/Output this shift: No intake/output data recorded.  Awake, alert, oriented to person.  PERRL.  Confused.  FC x 4 with minimal right sided drift. Incision c/d/i  Lab Results: Recent Labs    11/07/19 0310 11/08/19 0440  WBC 14.2* 15.4*  HGB 8.3* 8.7*  HCT 27.0* 29.2*  PLT 324 370   BMET Recent Labs    11/08/19 0440 11/09/19 0449  NA 154* 152*  K 3.6 3.5  CL 114* 115*  CO2 28 25  GLUCOSE 143* 191*  BUN 28* 34*  CREATININE 1.07 1.24  CALCIUM 8.7* 8.6*    Studies/Results: DG Swallowing Func-Speech Pathology  Result Date: 11/07/2019 Objective Swallowing Evaluation: Type of Study: MBS-Modified Barium Swallow Study  Patient Details Name: Tommy Green MRN: [id number] Date of Birth: 06-18-1946 Today's Date: 11/07/2019 Time: SLP Start Time (ACUTE ONLY): [id number] -SLP Stop Time (ACUTE ONLY): 1406 SLP Time Calculation (min) (ACUTE ONLY): 19 min Past Medical History: Past Medical History: Diagnosis Date . Anemia  . Essential hypertension 07/18/2017 . History of rheumatic fever  . History of stroke 07/18/2017 . Hypertriglyceridemia 07/18/2017 . Type 2 diabetes mellitus with diabetic neuropathy, unspecified ([place]) 07/18/2017 Past Surgical History: Past Surgical History: Procedure Laterality Date . CRANIOTOMY Left 10/25/2019  Procedure: CRANIOTOMY HEMATOMA EVACUATION SUBDURAL;  Surgeon: Vallarie Mare, MD;  Location: [facility];  Service: Neurosurgery;  Laterality: Left; . FOOT CAPSULOTOMY Left 07/25/2008  Mid Foot #2 MPJ . Hammertoe Repair Left  07/25/2008  #2 toe . SPINAL FUSION   . TARSAL TUNNEL RELEASE Left 07/25/2008 HPI: Pt is a 74 y.o. male who has a PMH w prior CVA, diabetes, HLD, and HTN. He suffered blunt head trauma a few weeks back and had no concerning findings on imaging.  Subsequently developed confusion, aphasia, mobility problems to the point he could no longer perform any ADLs. He presented to [facility] ED 1/6 and CT head showed loculated AoC left SDH with mass effect. Pt is now post evacuation of SDH on 1/7.  ETT 1/7 for procedure and again 1/11-1/17. Swallow eval 1/8 recommended NPO due to mentation and moderate-severe oral deficits.  Subjective: awake, alert, appropriate Assessment / Plan / Recommendation CHL IP CLINICAL IMPRESSIONS 11/07/2019 Clinical Impression Pt has a mild-moderate oropharyngeal dysphagia with oral deficits likely related to acute neurological issues and cognition with pharyngeal phase suspect to be at least partly secondary to post-extubation changes. He has occasional oral holding but typically initiates manipulation and transit on his own. His lingual manipulation is weak though and there is lingual pumping, delayed transit, reduced bolus cohesion, and premature spillage of liquids. Mild lingual residue is noted, increasing as boluses become more solid. He uses a spontaneous, although somewhat slow, second swallow that clears oral and pharyngeal residue that sits in the valleculae > pyriform sinuses. Pharyngeally pt has reduced laryngeal vestibule closure, anterior hyolaryngeal excursion, and epiglottic deflection. Epiglottic deflection was improved with heavier boluses and may improve further if Cortrak can be removed. Thin liquids were the only consistency that entered his airway before and during the  swallow with trace amount of silent aspiration noted. I would favor starting a little more cautiously with his diet (Dys 2 solids, nectar thick liquids) given that he has had fluctuating alertness and increased s/s of  aspiration at bedside. SLP will f/u clinically for potential to upgrade as mentation and vocal quality improve.  SLP Visit Diagnosis Dysphagia, oropharyngeal phase (R13.12) Attention and concentration deficit following -- Frontal lobe and executive function deficit following -- Impact on safety and function Moderate aspiration risk;Mild aspiration risk   CHL IP TREATMENT RECOMMENDATION 11/07/2019 Treatment Recommendations Therapy as outlined in treatment plan below   Prognosis 11/07/2019 Prognosis for Safe Diet Advancement Good Barriers to Reach Goals Cognitive deficits Barriers/Prognosis Comment -- CHL IP DIET RECOMMENDATION 11/07/2019 SLP Diet Recommendations Dysphagia 1 (Puree) solids;Nectar thick liquid Liquid Administration via Cup;Straw Medication Administration Whole meds with puree Compensations Minimize environmental distractions;Slow rate;Small sips/bites Postural Changes Seated upright at 90 degrees   CHL IP OTHER RECOMMENDATIONS 11/07/2019 Recommended Consults -- Oral Care Recommendations Oral care BID Other Recommendations Order thickener from pharmacy;Prohibited food (jello, ice cream, thin soups);Remove water pitcher;Have oral suction available   CHL IP FOLLOW UP RECOMMENDATIONS 11/07/2019 Follow up Recommendations Skilled Nursing facility   [facility] IP FREQUENCY AND DURATION 11/07/2019 Speech Therapy Frequency (ACUTE ONLY) min 2x/week Treatment Duration 2 weeks      CHL IP ORAL PHASE 11/07/2019 Oral Phase Impaired Oral - Pudding Teaspoon -- Oral - Pudding Cup -- Oral - Honey Teaspoon -- Oral - Honey Cup -- Oral - Nectar Teaspoon -- Oral - Nectar Cup Weak lingual manipulation;Lingual pumping;Reduced posterior propulsion;Delayed oral transit;Decreased bolus cohesion;Premature spillage;Lingual/palatal residue Oral - Nectar Straw Weak lingual manipulation;Lingual pumping;Reduced posterior propulsion;Delayed oral transit;Decreased bolus cohesion;Premature spillage;Lingual/palatal residue Oral - Thin Teaspoon Weak  lingual manipulation;Lingual pumping;Reduced posterior propulsion;Delayed oral transit;Decreased bolus cohesion;Premature spillage Oral - Thin Cup Weak lingual manipulation;Lingual pumping;Reduced posterior propulsion;Delayed oral transit;Decreased bolus cohesion;Premature spillage;Holding of bolus;Lingual/palatal residue Oral - Thin Straw Weak lingual manipulation;Lingual pumping;Reduced posterior propulsion;Delayed oral transit;Decreased bolus cohesion;Premature spillage;Lingual/palatal residue Oral - Puree Weak lingual manipulation;Lingual pumping;Reduced posterior propulsion;Delayed oral transit;Decreased bolus cohesion;Lingual/palatal residue Oral - Mech Soft Weak lingual manipulation;Lingual pumping;Reduced posterior propulsion;Delayed oral transit;Decreased bolus cohesion;Impaired mastication;Lingual/palatal residue Oral - Regular -- Oral - Multi-Consistency -- Oral - Pill -- Oral Phase - Comment --  CHL IP PHARYNGEAL PHASE 11/07/2019 Pharyngeal Phase Impaired Pharyngeal- Pudding Teaspoon -- Pharyngeal -- Pharyngeal- Pudding Cup -- Pharyngeal -- Pharyngeal- Honey Teaspoon -- Pharyngeal -- Pharyngeal- Honey Cup -- Pharyngeal -- Pharyngeal- Nectar Teaspoon -- Pharyngeal -- Pharyngeal- Nectar Cup Reduced epiglottic inversion;Reduced anterior laryngeal mobility;Reduced airway/laryngeal closure Pharyngeal -- Pharyngeal- Nectar Straw Reduced epiglottic inversion;Reduced anterior laryngeal mobility;Reduced airway/laryngeal closure Pharyngeal -- Pharyngeal- Thin Teaspoon Reduced epiglottic inversion;Reduced anterior laryngeal mobility;Reduced airway/laryngeal closure Pharyngeal -- Pharyngeal- Thin Cup Reduced epiglottic inversion;Reduced anterior laryngeal mobility;Reduced airway/laryngeal closure;Penetration/Aspiration before swallow Pharyngeal Material enters airway, passes BELOW cords without attempt by patient to eject out (silent aspiration) Pharyngeal- Thin Straw Reduced epiglottic inversion;Reduced anterior  laryngeal mobility;Reduced airway/laryngeal closure;Penetration/Aspiration during swallow Pharyngeal Material enters airway, CONTACTS cords and not ejected out Pharyngeal- Puree Reduced epiglottic inversion;Reduced anterior laryngeal mobility;Reduced airway/laryngeal closure Pharyngeal -- Pharyngeal- Mechanical Soft Reduced epiglottic inversion;Reduced anterior laryngeal mobility;Reduced airway/laryngeal closure;Pharyngeal residue - valleculae;Pharyngeal residue - pyriform Pharyngeal -- Pharyngeal- Regular -- Pharyngeal -- Pharyngeal- Multi-consistency -- Pharyngeal -- Pharyngeal- Pill -- Pharyngeal -- Pharyngeal Comment --  CHL IP CERVICAL ESOPHAGEAL PHASE 11/07/2019 Cervical Esophageal Phase WFL Pudding Teaspoon -- Pudding Cup -- Honey Teaspoon -- Honey Cup -- Nectar Teaspoon -- Nectar Cup --  Nectar Straw -- Thin Teaspoon -- Thin Cup -- Thin Straw -- Puree -- Mechanical Soft -- Regular -- Multi-consistency -- Pill -- Cervical Esophageal Comment -- Osie Bond., M.A. [facility] Pager [phone] Office [phone] 11/07/2019, 2:56 PM               Assessment/Plan: POD#14 s/p craniotomy for evacuation of acute-on-chronic SDH.  Persistent encephalopathy, likely related to his extensive subdural membranes with superimposed numerous medical conditions. Will need continued cognitive therapies, SNF vs rehab depending on participatory status.  He will also need routine f/u CT head without contrast in 2-4 weeks.  Please call with questions.   Vallarie Mare 11/09/2019, 9:11 AM

## 2019-11-09 NOTE — Progress Notes (Signed)
  Speech Language Pathology Treatment: Dysphagia  Patient Details Name: Tommy Green MRN: [id number] DOB: [birth year] Today's Date: 11/09/2019 Time: [id number] SLP Time Calculation (min) (ACUTE ONLY): 12 min  Assessment / Plan / Recommendation Clinical Impression  Patient presents with mild/mod oropharyngeal dysphagia in the setting of SDH. Recommend continue safe diet of dysphagia 1/NTL.  Patient seen sitting in chair at bedside. PTA reporting patient with significant secretions in oral cavity, some able to be removed. ST performed oral care with toothette via in-line suction, oral cavity cleaned. Pt noted to have hoarse voice quality. Pt with verbosity, attempting to speak continually. Pt seen with cup sips of NTL and small bites of ice cream dessert (puree). Pt with no overt s/s aspiration with D1 or NTL. Of note, patient w/ reduced safety awareness, attempting to speak often when puree bolus present in oral cavity. ST to follow acutely for safe diet advancement.    HPI HPI: Pt is a 74 y.o. male who has a PMH w prior CVA, diabetes, HLD, and HTN. He suffered blunt head trauma a few weeks back and had no concerning findings on imaging.  Subsequently developed confusion, aphasia, mobility problems to the point he could no longer perform any ADLs. He presented to [facility] ED 1/6 and CT head showed loculated AoC left SDH with mass effect. Pt is now post evacuation of SDH on 1/7.  ETT 1/7 for procedure and again 1/11-1/17. Swallow eval 1/8 recommended NPO due to mentation and moderate-severe oral deficits.      SLP Plan  Continue with current plan of care       Recommendations  Diet recommendations: Dysphagia 1 (puree);Nectar-thick liquid Liquids provided via: Cup Medication Administration: Crushed with puree Supervision: Full supervision/cueing for compensatory strategies Compensations: Minimize environmental distractions;Slow rate;Small sips/bites Postural Changes and/or Swallow Maneuvers:  Seated upright 90 degrees;Upright 30-60 min after meal                Oral Care Recommendations: Oral care QID;Staff/trained caregiver to provide oral care Follow up Recommendations: Skilled Nursing facility;24 hour supervision/assistance SLP Visit Diagnosis: Dysphagia, oropharyngeal phase (R13.12) Plan: Continue with current plan of care       [place], M.Ed., [place] [facility] [phone]: Acute Rehab office [phone] - pager    Marina Goodell 11/09/2019, 3:36 PM

## 2019-11-09 NOTE — Progress Notes (Addendum)
[facility]  PROGRESS NOTE  Tommy Green [id number] DOB: 10-25-1945 DOA: 10/24/2019 PCP: [name], DO   Brief HPI:   74 year old male who suffered blunt head trauma several weeks ago initially without changes on imaging however developed confusion, aphasia, mobility issues to the point where he could no longer perform ADLs.  Presented to [facility] on 10/24/2019 and CT head showed loculated left side SDH with mass-effect.  He was transferred to [facility] for neurosurgical evaluation.  On 10/25/2019 he was taken to the OR for craniotomy and evacuation of SDH.  PCCM was consulted for vent management postop however patient was extubated in PACU prior to transfer to ICU.  He was seen by Dr. Festus Holts on / 8 and signed off.  Patient had increased respiratory distress and altered mental status on 10/27/2019 and PCCM was reconsulted.  He completed 5 days Rocephin for H. influenzae pneumonia and PCCM signed off on 11/05/2019.  No further neurosurgical interventions. Patient transferred to [facility] service on 11/08/2019.    Subjective   Patient seen and examined, less agitated than yesterday.  Still in restraints.  Following commands, answering questions appropriately.   Assessment/Plan:     1. Acute encephalopathy-secondary to hyperactive delirium in setting of SDH.  Slowly improving.  Patient is off Precedex, started on Seroquel.  Ammonia level 41.  Also started on thiamine 100 mg p.o. daily 2. Subdural hematoma s/p evacuation via craniotomy-neurosurgery following, continue Keppra 3. Acute respiratory failure with hypoxemia-required intubation H influenza pneumonia, he was extubated on 11/04/2019.  Completed 5 days of Rocephin for pneumonia. 4. Dysphagia-patient was on tube feeds, swallow evaluation obtained.  Patient started on dysphagia 1 diet. 5. Acute kidney injury on CKD stage II-resolved 6. Hypernatremia-sodium is slowly improving, today sodium is 152.  Encourage free water intake.  If no  improvement by tomorrow we will consider starting D5W. 7. Diabetes mellitus type 2-switch sliding scale insulin to before every meal and at bedtime, blood glucose is elevated, will change Lantus to 25 units subcu daily.      SpO2: 92 % O2 Flow Rate (L/min): 2 L/min FiO2 (%): 21 %    Lab Results  Component Value Date   SARSCOV2NAA NEGATIVE 10/24/2019     CBG: Recent Labs  Lab 11/08/19 1646 11/08/19 1922 11/08/19 2128 11/09/19 0631 11/09/19 1219  GLUCAP 328* 260* 249* 216* 247*    CBC: Recent Labs  Lab 11/04/19 0815 11/05/19 0643 11/06/19 0431 11/07/19 0310 11/08/19 0440  WBC 13.7* 16.0* 15.7* 14.2* 15.4*  HGB 7.0* 8.0* 8.0* 8.3* 8.7*  HCT 23.0* 26.5* 26.4* 27.0* 29.2*  MCV 86.1 87.5 85.2 85.4 86.4  PLT 250 330 299 324 0000000    Basic Metabolic Panel: Recent Labs  Lab 11/03/19 0522 11/03/19 1556 11/05/19 0643 11/06/19 0431 11/07/19 0310 11/08/19 0440 11/09/19 0449  NA 150*   < > 148* 148* 146* 154* 152*  K 4.2   < > 3.6 3.5 3.3* 3.6 3.5  CL 116*   < > 111 111 110 114* 115*  CO2 26   < > 24 27 26 28 25   GLUCOSE 242*   < > 178* 274* 275* 143* 191*  BUN 55*   < > 43* 39* 32* 28* 34*  CREATININE 1.27*   < > 1.21 1.20 1.21 1.07 1.24  CALCIUM 8.7*   < > 8.9 8.7* 8.4* 8.7* 8.6*  MG 1.9  --   --   --   --   --   --  PHOS 2.4*  --   --   --   --   --   --    < > = values in this interval not displayed.     DVT prophylaxis: Lovenox  Code Status: Full code  Family Communication: No family at bedside  Disposition Plan: Discussed with patient's spouse, she wants him to go to a skilled nursing facility and not inpatient rehab.      Scheduled medications:  . chlorhexidine  15 mL Mouth Rinse BID  . Chlorhexidine Gluconate Cloth  6 each Topical Daily  . docusate  100 mg Oral BID  . enoxaparin (LOVENOX) injection  60 mg Subcutaneous Q24H  . gabapentin  600 mg Oral Q8H  . insulin aspart  0-20 Units Subcutaneous TID AC & HS  . insulin glargine  18 Units  Subcutaneous QHS  . mouth rinse  15 mL Mouth Rinse q12n4p  . pantoprazole (PROTONIX) IV  40 mg Intravenous Q24H  . QUEtiapine  25 mg Oral QPM  . senna-docusate  1 tablet Oral BID  . thiamine  100 mg Oral Daily    Consultants:  Neurosurgery  Procedures:  Craniotomy with evacuation of SDH  Antibiotics:   Anti-infectives (From admission, onward)   Start     Dose/Rate Route Frequency Ordered Stop   11/01/19 0900  cefTRIAXone (ROCEPHIN) 2 g in sodium chloride 0.9 % 100 mL IVPB     2 g 200 mL/hr over 30 Minutes Intravenous Every 24 hours 11/01/19 0855 11/05/19 2006   10/26/19 0000  ceFAZolin (ANCEF) IVPB 1 g/50 mL premix     1 g 100 mL/hr over 30 Minutes Intravenous Every 8 hours 10/25/19 2005 10/26/19 1754   10/25/19 1600  bacitracin 50,000 Units in sodium chloride 0.9 % 500 mL irrigation  Status:  Discontinued       As needed 10/25/19 1600 10/25/19 1825   10/25/19 0800  ceFAZolin (ANCEF) IVPB 2g/100 mL premix  Status:  Discontinued     2 g 200 mL/hr over 30 Minutes Intravenous 30 min pre-op 10/25/19 0459 10/25/19 2000       Objective   Vitals:   11/09/19 1043 11/09/19 1113 11/09/19 1143 11/09/19 1223  BP: (!) 146/86 (!) 150/94 (!) 146/89 (!) 146/92  Pulse: 93 97 94 96  Resp: (!) 28 (!) 29 (!) 26 (!) 25  Temp:    98.3 F (36.8 C)  TempSrc:    Oral  SpO2: 92% 91% 94% 92%  Weight:      Height:        Intake/Output Summary (Last 24 hours) at 11/09/2019 1240 Last data filed at 11/09/2019 1139 Gross per 24 hour  Intake 440 ml  Output 1300 ml  Net -860 ml    01/20 1901 - 01/22 0700 In: 200 [P.O.:200] Out: 2050 [Urine:2050]  Filed Weights   11/03/19 0500 11/06/19 0422 11/08/19 0346  Weight: 112 kg 106.2 kg 106 kg    Physical Examination:    General-appears in no acute distress  Heart-S1-S2, regular, no murmur auscultated  Lungs-clear to auscultation bilaterally, no wheezing or crackles auscultated  Abdomen-soft, nontender, no  organomegaly  Extremities-no edema in the lower extremities  Neuro-alert, oriented x3, no focal deficit noted     Studies:  DG Swallowing Func-Speech Pathology  Result Date: 11/07/2019 Objective Swallowing Evaluation: Type of Study: MBS-Modified Barium Swallow Study  Patient Details Name: Tommy Green MRN: [id number] Date of Birth: [birth year] Today's Date: 11/07/2019 Time: SLP Start Time (ACUTE ONLY): 1347 -SLP Stop  Time (ACUTE ONLY): 1406 SLP Time Calculation (min) (ACUTE ONLY): 19 min Past Medical History: Past Medical History: Diagnosis Date . Anemia  . Essential hypertension 07/18/2017 . History of rheumatic fever  . History of stroke 07/18/2017 . Hypertriglyceridemia 07/18/2017 . Type 2 diabetes mellitus with diabetic neuropathy, unspecified ([place]) 07/18/2017 Past Surgical History: Past Surgical History: Procedure Laterality Date . CRANIOTOMY Left 10/25/2019  Procedure: CRANIOTOMY HEMATOMA EVACUATION SUBDURAL;  Surgeon: Vallarie Mare, MD;  Location: [place];  Service: Neurosurgery;  Laterality: Left; . FOOT CAPSULOTOMY Left 07/25/2008  Mid Foot #2 MPJ . Hammertoe Repair Left 07/25/2008  #2 toe . SPINAL FUSION   . TARSAL TUNNEL RELEASE Left 07/25/2008 HPI: Pt is a 74 y.o. male who has a PMH w prior CVA, diabetes, HLD, and HTN. He suffered blunt head trauma a few weeks back and had no concerning findings on imaging.  Subsequently developed confusion, aphasia, mobility problems to the point he could no longer perform any ADLs. He presented to [facility] ED 1/6 and CT head showed loculated AoC left SDH with mass effect. Pt is now post evacuation of SDH on 1/7.  ETT 1/7 for procedure and again 1/11-1/17. Swallow eval 1/8 recommended NPO due to mentation and moderate-severe oral deficits.  Subjective: awake, alert, appropriate Assessment / Plan / Recommendation CHL IP CLINICAL IMPRESSIONS 11/07/2019 Clinical Impression Pt has a mild-moderate oropharyngeal dysphagia with oral deficits likely related to acute  neurological issues and cognition with pharyngeal phase suspect to be at least partly secondary to post-extubation changes. He has occasional oral holding but typically initiates manipulation and transit on his own. His lingual manipulation is weak though and there is lingual pumping, delayed transit, reduced bolus cohesion, and premature spillage of liquids. Mild lingual residue is noted, increasing as boluses become more solid. He uses a spontaneous, although somewhat slow, second swallow that clears oral and pharyngeal residue that sits in the valleculae > pyriform sinuses. Pharyngeally pt has reduced laryngeal vestibule closure, anterior hyolaryngeal excursion, and epiglottic deflection. Epiglottic deflection was improved with heavier boluses and may improve further if Cortrak can be removed. Thin liquids were the only consistency that entered his airway before and during the swallow with trace amount of silent aspiration noted. I would favor starting a little more cautiously with his diet (Dys 2 solids, nectar thick liquids) given that he has had fluctuating alertness and increased s/s of aspiration at bedside. SLP will f/u clinically for potential to upgrade as mentation and vocal quality improve.  SLP Visit Diagnosis Dysphagia, oropharyngeal phase (R13.12) Attention and concentration deficit following -- Frontal lobe and executive function deficit following -- Impact on safety and function Moderate aspiration risk;Mild aspiration risk   CHL IP TREATMENT RECOMMENDATION 11/07/2019 Treatment Recommendations Therapy as outlined in treatment plan below   Prognosis 11/07/2019 Prognosis for Safe Diet Advancement Good Barriers to Reach Goals Cognitive deficits Barriers/Prognosis Comment -- CHL IP DIET RECOMMENDATION 11/07/2019 SLP Diet Recommendations Dysphagia 1 (Puree) solids;Nectar thick liquid Liquid Administration via Cup;Straw Medication Administration Whole meds with puree Compensations Minimize environmental  distractions;Slow rate;Small sips/bites Postural Changes Seated upright at 90 degrees   CHL IP OTHER RECOMMENDATIONS 11/07/2019 Recommended Consults -- Oral Care Recommendations Oral care BID Other Recommendations Order thickener from pharmacy;Prohibited food (jello, ice cream, thin soups);Remove water pitcher;Have oral suction available   CHL IP FOLLOW UP RECOMMENDATIONS 11/07/2019 Follow up Recommendations Skilled Nursing facility   [facility] IP FREQUENCY AND DURATION 11/07/2019 Speech Therapy Frequency (ACUTE ONLY) min 2x/week Treatment Duration 2 weeks  CHL IP ORAL PHASE 11/07/2019 Oral Phase Impaired Oral - Pudding Teaspoon -- Oral - Pudding Cup -- Oral - Honey Teaspoon -- Oral - Honey Cup -- Oral - Nectar Teaspoon -- Oral - Nectar Cup Weak lingual manipulation;Lingual pumping;Reduced posterior propulsion;Delayed oral transit;Decreased bolus cohesion;Premature spillage;Lingual/palatal residue Oral - Nectar Straw Weak lingual manipulation;Lingual pumping;Reduced posterior propulsion;Delayed oral transit;Decreased bolus cohesion;Premature spillage;Lingual/palatal residue Oral - Thin Teaspoon Weak lingual manipulation;Lingual pumping;Reduced posterior propulsion;Delayed oral transit;Decreased bolus cohesion;Premature spillage Oral - Thin Cup Weak lingual manipulation;Lingual pumping;Reduced posterior propulsion;Delayed oral transit;Decreased bolus cohesion;Premature spillage;Holding of bolus;Lingual/palatal residue Oral - Thin Straw Weak lingual manipulation;Lingual pumping;Reduced posterior propulsion;Delayed oral transit;Decreased bolus cohesion;Premature spillage;Lingual/palatal residue Oral - Puree Weak lingual manipulation;Lingual pumping;Reduced posterior propulsion;Delayed oral transit;Decreased bolus cohesion;Lingual/palatal residue Oral - Mech Soft Weak lingual manipulation;Lingual pumping;Reduced posterior propulsion;Delayed oral transit;Decreased bolus cohesion;Impaired mastication;Lingual/palatal residue  Oral - Regular -- Oral - Multi-Consistency -- Oral - Pill -- Oral Phase - Comment --  CHL IP PHARYNGEAL PHASE 11/07/2019 Pharyngeal Phase Impaired Pharyngeal- Pudding Teaspoon -- Pharyngeal -- Pharyngeal- Pudding Cup -- Pharyngeal -- Pharyngeal- Honey Teaspoon -- Pharyngeal -- Pharyngeal- Honey Cup -- Pharyngeal -- Pharyngeal- Nectar Teaspoon -- Pharyngeal -- Pharyngeal- Nectar Cup Reduced epiglottic inversion;Reduced anterior laryngeal mobility;Reduced airway/laryngeal closure Pharyngeal -- Pharyngeal- Nectar Straw Reduced epiglottic inversion;Reduced anterior laryngeal mobility;Reduced airway/laryngeal closure Pharyngeal -- Pharyngeal- Thin Teaspoon Reduced epiglottic inversion;Reduced anterior laryngeal mobility;Reduced airway/laryngeal closure Pharyngeal -- Pharyngeal- Thin Cup Reduced epiglottic inversion;Reduced anterior laryngeal mobility;Reduced airway/laryngeal closure;Penetration/Aspiration before swallow Pharyngeal Material enters airway, passes BELOW cords without attempt by patient to eject out (silent aspiration) Pharyngeal- Thin Straw Reduced epiglottic inversion;Reduced anterior laryngeal mobility;Reduced airway/laryngeal closure;Penetration/Aspiration during swallow Pharyngeal Material enters airway, CONTACTS cords and not ejected out Pharyngeal- Puree Reduced epiglottic inversion;Reduced anterior laryngeal mobility;Reduced airway/laryngeal closure Pharyngeal -- Pharyngeal- Mechanical Soft Reduced epiglottic inversion;Reduced anterior laryngeal mobility;Reduced airway/laryngeal closure;Pharyngeal residue - valleculae;Pharyngeal residue - pyriform Pharyngeal -- Pharyngeal- Regular -- Pharyngeal -- Pharyngeal- Multi-consistency -- Pharyngeal -- Pharyngeal- Pill -- Pharyngeal -- Pharyngeal Comment --  CHL IP CERVICAL ESOPHAGEAL PHASE 11/07/2019 Cervical Esophageal Phase WFL Pudding Teaspoon -- Pudding Cup -- Honey Teaspoon -- Honey Cup -- Nectar Teaspoon -- Nectar Cup -- Nectar Straw -- Thin Teaspoon --  Thin Cup -- Thin Straw -- Puree -- Mechanical Soft -- Regular -- Multi-consistency -- Pill -- Cervical Esophageal Comment -- Osie Bond., M.A. [place] Acute Rehabilitation Services Pager [phone] Office [phone] 11/07/2019, 2:56 PM                Admission status: Inpatient: Based on patients clinical presentation and evaluation of above clinical data, I have made determination that patient meets Inpatient criteria at this time.   Oswald Hillock   [facility] If 7PM-7AM, please contact night-coverage at [email or web address], Office  [phone]  password [email or web address]  11/09/2019, 12:40 PM  LOS: 16 days

## 2019-11-09 NOTE — Progress Notes (Signed)
Patient on Room Air with Sp02=97%. Patient does not appear to be in any respiratory distress at this time with RR 23 bpm. Will continue to monitor and place patient on bipap if needed.

## 2019-11-09 NOTE — Progress Notes (Signed)
Physical Therapy Treatment Patient Details Name: Tommy Green MRN: [id number] DOB: 12/22/1945 Today's Date: 11/09/2019    History of Present Illness 74 yo male admitted with slurred speech and weakness and recent falls. chronic L SDH with mass effect (had blunt force trauma a few weeks PTA); craniotomy 10/25/19, intubated 1/11-extubated 11/04/19 PMH CVA DM with significant bil LE neuropathy HLD HTN CKD    PT Comments    Pt supine in bed in wrist and posey belt restraints.  He was eager to mobilize.  Upon sitting up foul odor noted from oral cavity utilized toothette wth suction to removal phelgm build up in back of his throat. He is responding better to transfer training and was able to stand with +2 external assistance with the RW.  Plan for SNF placement remains appropriate.     Follow Up Recommendations  SNF;Supervision for mobility/OOB     Equipment Recommendations  None recommended by PT(TBD at next venue)    Recommendations for Other Services       Precautions / Restrictions Precautions Precautions: Fall Restrictions Weight Bearing Restrictions: No    Mobility  Bed Mobility Overal bed mobility: Needs Assistance Bed Mobility: Supine to Sit     Supine to sit: Min guard;HOB elevated;+2 for safety/equipment     General bed mobility comments: pt able to transition from supine >EOB with min guard assist with increased time and effort with HOB elevated  Transfers Overall transfer level: Needs assistance Equipment used: Ambulation equipment used;Rolling walker (2 wheeled)(sara stedy x 2 and RW x 2) Transfers: Sit to/from Stand Sit to Stand: Mod assist;+2 physical assistance         General transfer comment: Overall mod +2.  he did however perform transfer to standing from elevated surface into sara stedy with supervision.  From commode ( elevated ) he require mod +1 and from recliner seat he required mod +2.  Cues for hand placement and assistance to boost into  standing.  Ambulation/Gait                 Stairs             Wheelchair Mobility    Modified Rankin (Stroke Patients Only)       Balance Overall balance assessment: Needs assistance Sitting-balance support: Feet supported;Single extremity supported;Bilateral upper extremity supported Sitting balance-Leahy Scale: Fair Sitting balance - Comments: close min guard for safety   Standing balance support: Bilateral upper extremity supported;During functional activity Standing balance-Leahy Scale: Poor Standing balance comment: reliant on BUE support and external assist                            Cognition Arousal/Alertness: Awake/alert Behavior During Therapy: WFL for tasks assessed/performed Overall Cognitive Status: Impaired/Different from baseline Area of Impairment: Orientation;Attention;Following commands;Safety/judgement;Awareness;Problem solving                 Orientation Level: Disoriented to;Place;Time;Situation Current Attention Level: Focused Memory: Decreased short-term memory;Decreased recall of precautions Following Commands: Follows one step commands inconsistently;Follows one step commands with increased time Safety/Judgement: Decreased awareness of safety;Decreased awareness of deficits Awareness: Intellectual Problem Solving: Requires verbal cues General Comments: pt unable to state location despite 4 choices given. pt able to follow commmands with increased time and verbal cues. pt continues to be confused stating he got rid of those people that were "going to posion him"      Exercises      General Comments General comments (  skin integrity, edema, etc.): Pt noted to have thick secretions in mouth found during oral care. Provided oral swab and contacted SLP to assess. RN aware      Pertinent Vitals/Pain Pain Assessment: Faces Pain Score: 0-No pain Faces Pain Scale: No hurt    Home Living                       Prior Function            PT Goals (current goals can now be found in the care plan section) Acute Rehab PT Goals Patient Stated Goal: did not state Potential to Achieve Goals: Fair Progress towards PT goals: Progressing toward goals    Frequency    Min 3X/week      PT Plan Current plan remains appropriate    Co-evaluation PT/OT/SLP Co-Evaluation/Treatment: Yes Reason for Co-Treatment: Complexity of the patient's impairments (multi-system involvement) PT goals addressed during session: Mobility/safety with mobility OT goals addressed during session: ADL's and self-care      AM-PAC PT "6 Clicks" Mobility   Outcome Measure  Help needed turning from your back to your side while in a flat bed without using bedrails?: Total Help needed moving from lying on your back to sitting on the side of a flat bed without using bedrails?: Total Help needed moving to and from a bed to a chair (including a wheelchair)?: Total Help needed standing up from a chair using your arms (e.g., wheelchair or bedside chair)?: Total Help needed to walk in hospital room?: Total Help needed climbing 3-5 steps with a railing? : Total 6 Click Score: 6    End of Session Equipment Utilized During Treatment: Gait belt Activity Tolerance: Patient limited by lethargy Patient left: in chair;with call bell/phone within reach;with chair alarm set Nurse Communication: Mobility status;Need for lift equipment PT Visit Diagnosis: Other abnormalities of gait and mobility (R26.89);Unsteadiness on feet (R26.81);Repeated falls (R29.6);Muscle weakness (generalized) (M62.81);Pain;Other symptoms and signs involving the nervous system (R29.898);Hemiplegia and hemiparesis Hemiplegia - Right/Left: Right Hemiplegia - dominant/non-dominant: Dominant Hemiplegia - caused by: Nontraumatic SAH     Time: [id number] PT Time Calculation (min) (ACUTE ONLY): 48 min  Charges:  $Therapeutic Activity: 8-22 mins                      Erasmo Leventhal , PTA Acute Rehabilitation Services Pager [phone] Office [phone]     Sonnie Pawloski [name] 11/09/2019, 5:54 PM

## 2019-11-09 NOTE — Progress Notes (Signed)
Occupational Therapy Treatment Patient Details Name: Tommy Green Ewings MRN: [id number] DOB: Sep 28, 1946 Today's Date: 11/09/2019    History of present illness 74 yo male admitted with slurred speech and weakness and recent falls. chronic L SDH with mass effect (had blunt force trauma a few weeks PTA); craniotomy 10/25/19, intubated 1/11-extubated 11/04/19 PMH CVA DM with significant bil LE neuropathy HLD HTN CKD   OT comments  Pt making steady progress towards OT goals this session. Session focus on functional transfer training and seated grooming tasks. Overall, pt required supervision- MOD A for x4  Sit>stands from various surface levels. Pt transferred via stedy from EOB>BSC>recliner and completed seated UB ADLs with supervision/ set- up assist. Continue to recommend SNF level therapy at time of DC. Will follow acutely per POC.    Follow Up Recommendations  SNF;Supervision/Assistance - 24 hour    Equipment Recommendations  Other (comment)(TBD at next venue of care)    Recommendations for Other Services      Precautions / Restrictions Precautions Precautions: Fall Restrictions Weight Bearing Restrictions: No       Mobility Bed Mobility Overal bed mobility: Needs Assistance Bed Mobility: Supine to Sit     Supine to sit: Min guard;HOB elevated;+2 for safety/equipment     General bed mobility comments: pt able to transition from supine >EOB with min guard assist with increased time and effort with HOB elevated  Transfers Overall transfer level: Needs assistance Equipment used: Ambulation equipment used;Rolling walker (2 wheeled) Transfers: Sit to/from Stand Sit to Stand: Supervision;Mod assist;From elevated surface;+2 physical assistance;+2 safety/equipment;Min assist         General transfer comment: pt initially sit>stand from elevated [facility] with supervision with increasd time and cues for hand placement. pt pivoted from EOB>BSC>recliner via stedy and required MODA  sit>stand from recliner with RW and a heavy MINA from [facility].    Balance Overall balance assessment: Needs assistance Sitting-balance support: Feet supported;Single extremity supported;Bilateral upper extremity supported Sitting balance-Leahy Scale: Fair Sitting balance - Comments: close min guard for safety   Standing balance support: Bilateral upper extremity supported;During functional activity Standing balance-Leahy Scale: Poor Standing balance comment: reliant on BUE support and external assist                           ADL either performed or assessed with clinical judgement   ADL Overall ADL's : Needs assistance/impaired     Grooming: Wash/dry face;Oral care;Sitting;Set up;Supervision/safety;Cueing for safety Grooming Details (indicate cue type and reason): cues for safety to spit out water d/t D2 diet.             Lower Body Dressing: Bed level;Maximal assistance Lower Body Dressing Details (indicate cue type and reason): to don socks in supine Toilet Transfer: Minimal assistance;BSC;Min guard;+2 for safety/equipment;+2 for physical assistance Toilet Transfer Details (indicate cue type and reason): use of stedy from EOB>BSC. initially minguard from EOB but heavy MIN A from [facility].   Toileting - Clothing Manipulation Details (indicate cue type and reason): unable to void     Functional mobility during ADLs: Minimal assistance;Min guard;+2 for safety/equipment;+2 for physical assistance General ADL Comments: session focus on functional transfer training via stedy with pt able to power into standing with min guard from EOB but required MIN A from [facility]. Pt completed seated grooming tasks with set-up/supervision     Vision       Perception     Praxis      Cognition Arousal/Alertness: Awake/alert  Behavior During Therapy: WFL for tasks assessed/performed Overall Cognitive Status: Impaired/Different from baseline Area of Impairment:  Orientation;Attention;Following commands;Safety/judgement;Awareness;Problem solving                 Orientation Level: Disoriented to;Place;Time;Situation Current Attention Level: Focused   Following Commands: Follows one step commands inconsistently;Follows one step commands with increased time Safety/Judgement: Decreased awareness of safety;Decreased awareness of deficits Awareness: Intellectual Problem Solving: Requires verbal cues General Comments: pt unable to state location despite 4 choices given. pt able to follow commmands with increased time and verbal cues. pt continues to be confused stating he got rid of those people that were "going to posion him"        Exercises     Shoulder Instructions       General Comments Pt noted to have thick secretions in mouth found during oral care. Provided oral swab and contacted SLP to assess. RN aware    Pertinent Vitals/ Pain       Pain Assessment: No/denies pain Pain Score: 0-No pain  Home Living                                          Prior Functioning/Environment              Frequency  Min 2X/week        Progress Toward Goals  OT Goals(current goals can now be found in the care plan section)  Progress towards OT goals: Progressing toward goals  Acute Rehab OT Goals Patient Stated Goal: did not state OT Goal Formulation: With patient Time For Goal Achievement: 11/19/19 Potential to Achieve Goals: Good  Plan Discharge plan remains appropriate    Co-evaluation                 AM-PAC OT "6 Clicks" Daily Activity     Outcome Measure   Help from another person eating meals?: Total Help from another person taking care of personal grooming?: A Lot Help from another person toileting, which includes using toliet, bedpan, or urinal?: A Lot Help from another person bathing (including washing, rinsing, drying)?: A Lot Help from another person to put on and taking off regular upper  body clothing?: A Little Help from another person to put on and taking off regular lower body clothing?: A Lot 6 Click Score: 12    End of Session Equipment Utilized During Treatment: Gait belt;Rolling walker  OT Visit Diagnosis: Muscle weakness (generalized) (M62.81);Other symptoms and signs involving cognitive function   Activity Tolerance Patient tolerated treatment well   Patient Left in chair;with call bell/phone within reach;with family/visitor present   Nurse Communication Mobility status        Time: [id number] OT Time Calculation (min): 48 min  Charges: OT General Charges $OT Visit: 1 Visit OT Treatments $Self Care/Home Management : 8-22 mins  Lanier Clam., COTA/L Acute Rehabilitation Services [phone]    Ihor Gully 11/09/2019, 4:24 PM

## 2019-11-09 NOTE — TOC Progression Note (Signed)
Transition of Care [facility]) - Progression Note    Patient Details  Name: Tommy Green MRN: [id number] Date of Birth: [birth year]  Transition of Care [facility]) CM/SW [place], [place] Phone Number: 11/09/2019, 4:12 PM  Clinical Narrative:    Tommy Green has received referral for pt. They will review and f/u with [facility] team pending bed availability and if SNF can meet pt needs.   Pt currently not medically ready for discharge, will continue to follow.    Expected Discharge Plan: [place] Barriers to Discharge: Continued Medical Work up  Expected Discharge Plan and Services Expected Discharge Plan: [place] arrangements for the past 2 months: Single Family Home  Readmission Risk Interventions No flowsheet data found.

## 2019-11-09 NOTE — Progress Notes (Signed)
Inpatient Rehabilitation-Admissions Coordinator   Met with pt and his wife in the room this afternoon. We had a long discussion regarding rehab venues, recommendations, and anticipated assist at DC. Pt's wife has chosen SNF placement at this time due to lack of assistance at DC.   AC will sign off and will notify TOC team regarding preference.    [place], OTR/L  Rehab Admissions Coordinator  [phone] 11/09/2019 2:57 PM  

## 2019-11-10 LAB — BASIC METABOLIC PANEL
Anion gap: 12 (ref 5–15)
BUN: 35 mg/dL — ABNORMAL HIGH (ref 8–23)
CO2: 25 mmol/L (ref 22–32)
Calcium: 8.8 mg/dL — ABNORMAL LOW (ref 8.9–10.3)
Chloride: 119 mmol/L — ABNORMAL HIGH (ref 98–111)
Creatinine, Ser: 1.39 mg/dL — ABNORMAL HIGH (ref 0.61–1.24)
GFR calc Af Amer: 57 mL/min — ABNORMAL LOW (ref >60)
GFR calc non Af Amer: 49 mL/min — ABNORMAL LOW (ref >60)
Glucose, Bld: 144 mg/dL — ABNORMAL HIGH (ref 70–99)
Potassium: 3.4 mmol/L — ABNORMAL LOW (ref 3.5–5.1)
Sodium: 156 mmol/L — ABNORMAL HIGH (ref 135–145)

## 2019-11-10 LAB — GLUCOSE, CAPILLARY
Glucose-Capillary: 150 mg/dL — ABNORMAL HIGH (ref 70–99)
Glucose-Capillary: 291 mg/dL — ABNORMAL HIGH (ref 70–99)
Glucose-Capillary: 268 mg/dL — ABNORMAL HIGH (ref 70–99)
Glucose-Capillary: 250 mg/dL — ABNORMAL HIGH (ref 70–99)
Glucose-Capillary: 242 mg/dL — ABNORMAL HIGH (ref 70–99)

## 2019-11-10 MED ORDER — DEXTROSE 5 % IV SOLN: INTRAVENOUS | Status: DC

## 2019-11-10 MED ORDER — INSULIN GLARGINE 100 UNIT/ML ~~LOC~~ SOLN
30.0000 [IU] | Freq: Every day | SUBCUTANEOUS | Status: DC
  Administered 2019-11-10 – 2019-11-15 (×6): 30 [IU] via SUBCUTANEOUS
  Filled 2019-11-10 (×8): qty 0.3

## 2019-11-10 MED ORDER — POTASSIUM CHLORIDE CRYS ER 20 MEQ PO TBCR
40.0000 meq | EXTENDED_RELEASE_TABLET | Freq: Once | ORAL | Status: AC
  Administered 2019-11-10: 10:00:00 40 meq via ORAL
  Filled 2019-11-10: qty 2

## 2019-11-10 MED ORDER — INSULIN ASPART 100 UNIT/ML ~~LOC~~ SOLN
0.0000 [IU] | SUBCUTANEOUS | Status: DC
  Administered 2019-11-10: 17:00:00 11 [IU] via SUBCUTANEOUS
  Administered 2019-11-10 (×2): 7 [IU] via SUBCUTANEOUS
  Administered 2019-11-10: 12:00:00 11 [IU] via SUBCUTANEOUS
  Administered 2019-11-11: 12:00:00 7 [IU] via SUBCUTANEOUS
  Administered 2019-11-11: 11 [IU] via SUBCUTANEOUS
  Administered 2019-11-11 – 2019-11-12 (×3): 4 [IU] via SUBCUTANEOUS
  Administered 2019-11-12: 7 [IU] via SUBCUTANEOUS
  Administered 2019-11-12: 3 [IU] via SUBCUTANEOUS
  Administered 2019-11-12: 18:00:00 11 [IU] via SUBCUTANEOUS
  Administered 2019-11-12: 4 [IU] via SUBCUTANEOUS
  Administered 2019-11-13: 18:00:00 7 [IU] via SUBCUTANEOUS
  Administered 2019-11-13 (×2): 4 [IU] via SUBCUTANEOUS
  Administered 2019-11-13: 13:00:00 7 [IU] via SUBCUTANEOUS
  Administered 2019-11-13: 3 [IU] via SUBCUTANEOUS
  Administered 2019-11-13: 7 [IU] via SUBCUTANEOUS
  Administered 2019-11-14 (×2): 3 [IU] via SUBCUTANEOUS
  Administered 2019-11-14 (×2): 4 [IU] via SUBCUTANEOUS
  Administered 2019-11-14: 7 [IU] via SUBCUTANEOUS
  Administered 2019-11-15 (×3): 4 [IU] via SUBCUTANEOUS
  Administered 2019-11-15: 7 [IU] via SUBCUTANEOUS
  Administered 2019-11-16: 13:00:00 4 [IU] via SUBCUTANEOUS

## 2019-11-10 NOTE — Progress Notes (Signed)
Patient still in restraints.  Bipap is contraindicated at this time.  Will continue to monitor.

## 2019-11-10 NOTE — Progress Notes (Signed)
[facility]  PROGRESS NOTE  Tommy Green [id number] DOB: 1945-12-10 DOA: 10/24/2019 PCP: [name], DO   Brief HPI:   74 year old male who suffered blunt head trauma several weeks ago initially without changes on imaging however developed confusion, aphasia, mobility issues to the point where he could no longer perform ADLs.  Presented to [facility] on 10/24/2019 and CT head showed loculated left side SDH with mass-effect.  He was transferred to [facility] for neurosurgical evaluation.  On 10/25/2019 he was taken to the OR for craniotomy and evacuation of SDH.  PCCM was consulted for vent management postop however patient was extubated in PACU prior to transfer to ICU.  He was seen by Dr. Festus Holts on / 8 and signed off.  Patient had increased respiratory distress and altered mental status on 10/27/2019 and PCCM was reconsulted.  He completed 5 days Rocephin for H. influenzae pneumonia and PCCM signed off on 11/05/2019.  No further neurosurgical interventions. Patient transferred to [facility] service on 11/08/2019.    Subjective   Patient seen and examined, continues to be confused. In restraints.   Assessment/Plan:     1. Acute encephalopathy-secondary to hyperactive delirium in setting of SDH.  Slowly improving.  Patient is off Precedex, started on Seroquel.  Ammonia level 41.  Also started on thiamine 100 mg p.o. daily 2. Subdural hematoma s/p evacuation via craniotomy-neurosurgery following, continue Keppra 3. Acute respiratory failure with hypoxemia-required intubation H influenza pneumonia, he was extubated on 11/04/2019.  Completed 5 days of Rocephin for pneumonia. 4. Dysphagia-patient was on tube feeds, swallow evaluation obtained.  Patient started on dysphagia 1 diet. 5. Acute kidney injury on CKD stage II-resolved 6. Hypernatremia-sodium is worse today, 156. Likely from poor p.o. intake. Will start patient on D5W at 100 mL/h. If blood glucose is difficult to control, he may need  to be on Endo tool. Diabetic medications will be adjusted as below. 7. Diabetes mellitus type 2-switch sliding scale insulin to before every meal and at bedtime, blood glucose is elevated, will change Lantus to 30 units subcu daily. Check CBG every 4 hours. 8. Hypokalemia-potassium was 3.4, will replace potassium with IV KCl 10 mEq x 1. Follow BMP in a.m.    SpO2: 91 % O2 Flow Rate (L/min): 2 L/min FiO2 (%): 21 %    Lab Results  Component Value Date   SARSCOV2NAA NEGATIVE 10/24/2019     CBG: Recent Labs  Lab 11/09/19 0631 11/09/19 1219 11/09/19 1701 11/09/19 2115 11/10/19 0606  GLUCAP 216* 247* 275* 221* 150*    CBC: Recent Labs  Lab 11/04/19 0815 11/05/19 0643 11/06/19 0431 11/07/19 0310 11/08/19 0440  WBC 13.7* 16.0* 15.7* 14.2* 15.4*  HGB 7.0* 8.0* 8.0* 8.3* 8.7*  HCT 23.0* 26.5* 26.4* 27.0* 29.2*  MCV 86.1 87.5 85.2 85.4 86.4  PLT 250 330 299 324 0000000    Basic Metabolic Panel: Recent Labs  Lab 11/06/19 0431 11/07/19 0310 11/08/19 0440 11/09/19 0449 11/10/19 0255  NA 148* 146* 154* 152* 156*  K 3.5 3.3* 3.6 3.5 3.4*  CL 111 110 114* 115* 119*  CO2 27 26 28 25 25   GLUCOSE 274* 275* 143* 191* 144*  BUN 39* 32* 28* 34* 35*  CREATININE 1.20 1.21 1.07 1.24 1.39*  CALCIUM 8.7* 8.4* 8.7* 8.6* 8.8*     DVT prophylaxis: Lovenox  Code Status: Full code  Family Communication: No family at bedside  Disposition Plan: Discussed with patient's spouse, she wants him to go to a skilled nursing  facility and not inpatient rehab.      Scheduled medications:  . chlorhexidine  15 mL Mouth Rinse BID  . Chlorhexidine Gluconate Cloth  6 each Topical Daily  . docusate  100 mg Oral BID  . enoxaparin (LOVENOX) injection  60 mg Subcutaneous Q24H  . gabapentin  600 mg Oral Q8H  . insulin aspart  0-20 Units Subcutaneous Q4H  . insulin glargine  30 Units Subcutaneous QHS  . mouth rinse  15 mL Mouth Rinse q12n4p  . QUEtiapine  25 mg Oral QPM  . senna-docusate  1  tablet Oral BID  . thiamine  100 mg Oral Daily    Consultants:  Neurosurgery  Procedures:  Craniotomy with evacuation of SDH  Antibiotics:   Anti-infectives (From admission, onward)   Start     Dose/Rate Route Frequency Ordered Stop   11/01/19 0900  cefTRIAXone (ROCEPHIN) 2 g in sodium chloride 0.9 % 100 mL IVPB     2 g 200 mL/hr over 30 Minutes Intravenous Every 24 hours 11/01/19 0855 11/05/19 2006   10/26/19 0000  ceFAZolin (ANCEF) IVPB 1 g/50 mL premix     1 g 100 mL/hr over 30 Minutes Intravenous Every 8 hours 10/25/19 2005 10/26/19 1754   10/25/19 1600  bacitracin 50,000 Units in sodium chloride 0.9 % 500 mL irrigation  Status:  Discontinued       As needed 10/25/19 1600 10/25/19 1825   10/25/19 0800  ceFAZolin (ANCEF) IVPB 2g/100 mL premix  Status:  Discontinued     2 g 200 mL/hr over 30 Minutes Intravenous 30 min pre-op 10/25/19 0459 10/25/19 2000       Objective   Vitals:   11/10/19 0318 11/10/19 0424 11/10/19 0828 11/10/19 0841  BP:   (!) 151/103 (!) 164/83  Pulse: 96  (!) 106 (!) 105  Resp: 20  (!) 21 (!) 23  Temp:   98.1 F (36.7 C) 98.3 F (36.8 C)  TempSrc:   Oral Oral  SpO2: 96%  93% 91%  Weight:  115.2 kg    Height:        Intake/Output Summary (Last 24 hours) at 11/10/2019 1048 Last data filed at 11/10/2019 0800 Gross per 24 hour  Intake 600 ml  Output 1730 ml  Net -1130 ml    01/21 1901 - 01/23 0700 In: 720 [P.O.:720] Out: 2330 [Urine:2330]  Filed Weights   11/06/19 0422 11/08/19 0346 11/10/19 0424  Weight: 106.2 kg 106 kg 115.2 kg    Physical Examination:   General-confused Heart-S1-S2, regular, no murmur auscultated Lungs-clear to auscultation bilaterally, no wheezing or crackles auscultated Abdomen-soft, nontender, no organomegaly Extremities-no edema in the lower extremities Neuro-alert, not oriented x3, moving all extremities    Studies:  No results found.   Admission status: Inpatient: Based on patients clinical  presentation and evaluation of above clinical data, I have made determination that patient meets Inpatient criteria at this time.   Oswald Hillock   [facility] If 7PM-7AM, please contact night-coverage at [email or web address], Office  [phone]  password [email or web address]  11/10/2019, 10:48 AM  LOS: 17 days

## 2019-11-11 DIAGNOSIS — E119 Type 2 diabetes mellitus without complications: Secondary | ICD-10-CM

## 2019-11-11 DIAGNOSIS — Z794 Long term (current) use of insulin: Secondary | ICD-10-CM

## 2019-11-11 LAB — GLUCOSE, CAPILLARY
Glucose-Capillary: 165 mg/dL — ABNORMAL HIGH (ref 70–99)
Glucose-Capillary: 157 mg/dL — ABNORMAL HIGH (ref 70–99)
Glucose-Capillary: 249 mg/dL — ABNORMAL HIGH (ref 70–99)
Glucose-Capillary: 256 mg/dL — ABNORMAL HIGH (ref 70–99)
Glucose-Capillary: 257 mg/dL — ABNORMAL HIGH (ref 70–99)

## 2019-11-11 LAB — BASIC METABOLIC PANEL
Anion gap: 12 (ref 5–15)
BUN: 30 mg/dL — ABNORMAL HIGH (ref 8–23)
CO2: 24 mmol/L (ref 22–32)
Calcium: 8.6 mg/dL — ABNORMAL LOW (ref 8.9–10.3)
Chloride: 117 mmol/L — ABNORMAL HIGH (ref 98–111)
Creatinine, Ser: 1.24 mg/dL (ref 0.61–1.24)
GFR calc Af Amer: >60 mL/min (ref >60)
GFR calc non Af Amer: 57 mL/min — ABNORMAL LOW (ref >60)
Glucose, Bld: 169 mg/dL — ABNORMAL HIGH (ref 70–99)
Potassium: 3.7 mmol/L (ref 3.5–5.1)
Sodium: 153 mmol/L — ABNORMAL HIGH (ref 135–145)

## 2019-11-11 MED ORDER — ENOXAPARIN SODIUM 60 MG/0.6ML ~~LOC~~ SOLN
60.0000 mg | SUBCUTANEOUS | Status: DC
  Filled 2019-11-11: qty 0.6

## 2019-11-11 MED ORDER — ENOXAPARIN SODIUM 60 MG/0.6ML ~~LOC~~ SOLN
60.0000 mg | SUBCUTANEOUS | Status: DC
  Administered 2019-11-11 – 2019-11-15 (×5): 60 mg via SUBCUTANEOUS
  Filled 2019-11-11 (×7): qty 0.6

## 2019-11-11 NOTE — Progress Notes (Addendum)
[facility]  PROGRESS NOTE  Tommy Green [id number] DOB: Jan 24, 1946 DOA: 10/24/2019 PCP: [name], DO   Brief HPI:   74 year old male who suffered blunt head trauma several weeks ago initially without changes on imaging however developed confusion, aphasia, mobility issues to the point where he could no longer perform ADLs.  Presented to [facility] on 10/24/2019 and CT head showed loculated left side SDH with mass-effect.  He was transferred to [facility] for neurosurgical evaluation.  On 10/25/2019 he was taken to the OR for craniotomy and evacuation of SDH.  PCCM was consulted for vent management postop however patient was extubated in PACU prior to transfer to ICU.  He was seen by Dr. Festus Holts on / 8 and signed off.  Patient had increased respiratory distress and altered mental status on 10/27/2019 and PCCM was reconsulted.  He completed 5 days Rocephin for H. influenzae pneumonia and PCCM signed off on 11/05/2019.  No further neurosurgical interventions. Patient transferred to [facility] service on 11/08/2019.    Subjective   Patient seen and examined, still in restraints.  He was started on D5W at 100 mL/h, his IV got infiltrated so most of the day he did not get D5W.  Sodium is down to 153.   Assessment/Plan:     1. Acute encephalopathy-secondary to hyperactive delirium in setting of SDH.  Slowly improving.  Patient is off Precedex, started on Seroquel.  Ammonia level 41.  Also started on thiamine 100 mg p.o. daily.  Also has hypernatremia, currently on D5W. 2. Subdural hematoma s/p evacuation via craniotomy-neurosurgery following, continue Keppra. 3. Acute respiratory failure with hypoxemia-required intubation H influenza pneumonia, he was extubated on 11/04/2019.  Completed 5 days of Rocephin for pneumonia.  Currently on 3 L/min oxygen via nasal cannula. 4. Dysphagia-patient was on tube feeds, swallow evaluation obtained.  Patient started on dysphagia 1 diet. 5. Acute kidney injury  on CKD stage II-resolved 6. Hypernatremia-sodium is slowly improving, today 153,  was 156 yesterday.Likely from poor p.o. intake.  Patient did not receive D5W most of the day yesterday as IV got infiltrated.  Currently getting D5W.  Continue at 100 mL/h. If blood glucose is difficult to control, he may need to be on Endo tool. Diabetic medications has been  adjusted as below. 7. Diabetes mellitus type 2-switch sliding scale insulin to before every meal and at bedtime, blood glucose is better controlled, continue Lantus 13 and subcu daily. 8. Hypokalemia-replete   SpO2: 97 % O2 Flow Rate (L/min): 3 L/min FiO2 (%): 21 %    Lab Results  Component Value Date   SARSCOV2NAA NEGATIVE 10/24/2019     CBG: Recent Labs  Lab 11/10/19 1631 11/10/19 1933 11/10/19 2317 11/11/19 0325 11/11/19 0710  GLUCAP 268* 250* 242* 165* 157*    CBC: Recent Labs  Lab 11/05/19 0643 11/06/19 0431 11/07/19 0310 11/08/19 0440  WBC 16.0* 15.7* 14.2* 15.4*  HGB 8.0* 8.0* 8.3* 8.7*  HCT 26.5* 26.4* 27.0* 29.2*  MCV 87.5 85.2 85.4 86.4  PLT 330 299 324 0000000    Basic Metabolic Panel: Recent Labs  Lab 11/07/19 0310 11/08/19 0440 11/09/19 0449 11/10/19 0255 11/11/19 0528  NA 146* 154* 152* 156* 153*  K 3.3* 3.6 3.5 3.4* 3.7  CL 110 114* 115* 119* 117*  CO2 26 28 25 25 24   GLUCOSE 275* 143* 191* 144* 169*  BUN 32* 28* 34* 35* 30*  CREATININE 1.21 1.07 1.24 1.39* 1.24  CALCIUM 8.4* 8.7* 8.6* 8.8* 8.6*  DVT prophylaxis: Lovenox, started by Neurosurgery on 10/29/19.  Called and discussed with nurse practitioner Maryam on call, she agrees with stopping Lovenox at this time.  Will start SCDs.  She will discussed with Dr. Duffy Rhody in a.m regarding DVT prophylaxis.  Code Status: Full code  Family Communication: No family at bedside  Disposition Plan: Discussed with patient's spouse, she wants him to go to a skilled nursing facility and not inpatient rehab.      Scheduled  medications:  . chlorhexidine  15 mL Mouth Rinse BID  . Chlorhexidine Gluconate Cloth  6 each Topical Daily  . docusate  100 mg Oral BID  . enoxaparin (LOVENOX) injection  60 mg Subcutaneous Q24H  . gabapentin  600 mg Oral Q8H  . insulin aspart  0-20 Units Subcutaneous Q4H  . insulin glargine  30 Units Subcutaneous QHS  . mouth rinse  15 mL Mouth Rinse q12n4p  . QUEtiapine  25 mg Oral QPM  . senna-docusate  1 tablet Oral BID  . thiamine  100 mg Oral Daily    Consultants:  Neurosurgery  Procedures:  Craniotomy with evacuation of SDH  Antibiotics:   Anti-infectives (From admission, onward)   Start     Dose/Rate Route Frequency Ordered Stop   11/01/19 0900  cefTRIAXone (ROCEPHIN) 2 g in sodium chloride 0.9 % 100 mL IVPB     2 g 200 mL/hr over 30 Minutes Intravenous Every 24 hours 11/01/19 0855 11/05/19 2006   10/26/19 0000  ceFAZolin (ANCEF) IVPB 1 g/50 mL premix     1 g 100 mL/hr over 30 Minutes Intravenous Every 8 hours 10/25/19 2005 10/26/19 1754   10/25/19 1600  bacitracin 50,000 Units in sodium chloride 0.9 % 500 mL irrigation  Status:  Discontinued       As needed 10/25/19 1600 10/25/19 1825   10/25/19 0800  ceFAZolin (ANCEF) IVPB 2g/100 mL premix  Status:  Discontinued     2 g 200 mL/hr over 30 Minutes Intravenous 30 min pre-op 10/25/19 0459 10/25/19 2000       Objective   Vitals:   11/10/19 1954 11/10/19 2344 11/11/19 0329 11/11/19 0754  BP: (!) 146/84 130/73 (!) 145/84 (!) 144/83  Pulse: 88 82 86 88  Resp: (!) 22 (!) 23 (!) 21 (!) 24  Temp: 97.8 F (36.6 C) (!) 97.4 F (36.3 C) (!) 97.5 F (36.4 C) 98.4 F (36.9 C)  TempSrc: Oral Oral Oral Axillary  SpO2:    97%  Weight:      Height:        Intake/Output Summary (Last 24 hours) at 11/11/2019 1102 Last data filed at 11/11/2019 0900 Gross per 24 hour  Intake 2310.59 ml  Output 1400 ml  Net 910.59 ml    01/22 1901 - 01/24 0700 In: 2214.6 [P.O.:676; I.V.:1538.6] Out: 2530 [Urine:2530]  Filed  Weights   11/06/19 0422 11/08/19 0346 11/10/19 0424  Weight: 106.2 kg 106 kg 115.2 kg    Physical Examination:   General-appears in no acute distress in soft restraints Heart-S1-S2, regular, no murmur auscultated Lungs-clear to auscultation bilaterally, no wheezing or crackles auscultated Abdomen-soft, nontender, no organomegaly Extremities-no edema in the lower extremities Neuro-somnolent, arousable, continues to be confused    Studies:  No results found.   Admission status: Inpatient: Based on patients clinical presentation and evaluation of above clinical data, I have made determination that patient meets Inpatient criteria at this time.   Oswald Hillock   [facility] If 7PM-7AM, please contact night-coverage  at [email or web address], Office  [phone]  password [email or web address]  11/11/2019, 11:02 AM  LOS: 18 days

## 2019-11-11 NOTE — Progress Notes (Addendum)
Patient has been on Lovenox for DVT prophylaxis, started by neurosurgery on 10/29/19.  This was started by Dr. Duffy Rhody.  Patient had craniotomy with evacuation of SDH on 10/25/19.  Called and discussed with neurosurgery on call, [place] for Dr. Saintclair Halsted.  She initially recommended to stop Lovenox and start DVT prophylaxis with SCDs.  However after discussion with Dr. Saintclair Halsted she called back and told to restart Lovenox.  I will restart Lovenox at 60 mg subcu daily.

## 2019-11-11 NOTE — Progress Notes (Signed)
Pt has not voided since foley catheter removal at 1000. Bladder scan reveals 768mL. MD order to insert foley due to unresolved retention. Foley inserted at 1830. 836mL urine emptied into bag. Bladder scan shows 45mL in bladder.Pt tolerated procedure well. Will continue to monitor.

## 2019-11-12 LAB — GLUCOSE, CAPILLARY
Glucose-Capillary: 142 mg/dL — ABNORMAL HIGH (ref 70–99)
Glucose-Capillary: 142 mg/dL — ABNORMAL HIGH (ref 70–99)
Glucose-Capillary: 153 mg/dL — ABNORMAL HIGH (ref 70–99)
Glucose-Capillary: 158 mg/dL — ABNORMAL HIGH (ref 70–99)
Glucose-Capillary: 214 mg/dL — ABNORMAL HIGH (ref 70–99)
Glucose-Capillary: 251 mg/dL — ABNORMAL HIGH (ref 70–99)

## 2019-11-12 LAB — BASIC METABOLIC PANEL
Anion gap: 12 (ref 5–15)
BUN: 24 mg/dL — ABNORMAL HIGH (ref 8–23)
CO2: 24 mmol/L (ref 22–32)
Calcium: 8.4 mg/dL — ABNORMAL LOW (ref 8.9–10.3)
Chloride: 109 mmol/L (ref 98–111)
Creatinine, Ser: 1.24 mg/dL (ref 0.61–1.24)
GFR calc Af Amer: >60 mL/min (ref >60)
GFR calc non Af Amer: 57 mL/min — ABNORMAL LOW (ref >60)
Glucose, Bld: 180 mg/dL — ABNORMAL HIGH (ref 70–99)
Potassium: 3.5 mmol/L (ref 3.5–5.1)
Sodium: 145 mmol/L (ref 135–145)

## 2019-11-12 MED ORDER — KCL IN DEXTROSE-NACL 10-5-0.45 MEQ/L-%-% IV SOLN
INTRAVENOUS | Status: DC
  Filled 2019-11-12 (×6): qty 1000

## 2019-11-12 MED FILL — Fentanyl Citrate Preservative Free (PF) Inj 2500 MCG/50ML: INTRAMUSCULAR | Qty: 50 | Status: AC

## 2019-11-12 MED FILL — Sodium Chloride IV Soln 0.9%: INTRAVENOUS | Qty: 250 | Status: AC

## 2019-11-12 NOTE — Procedures (Signed)
Patient remains in restraints, Bipap will not be used at this time.

## 2019-11-12 NOTE — Progress Notes (Signed)
Physical Therapy Treatment Patient Details Name: Tommy Green MRN: [id number] DOB: May 28, 1946 Today's Date: 11/12/2019    History of Present Illness 73 yo male admitted with slurred speech and weakness and recent falls. chronic L SDH with mass effect (had blunt force trauma a few weeks PTA); craniotomy 10/25/19, intubated 1/11-extubated 11/04/19 PMH CVA DM with significant bil LE neuropathy HLD HTN CKD    PT Comments    Pt performed gt training and functional mobility with progression to short trial with close chair follow.  Pt limited due to lethargy.  Continue to recommend snf placement before returning home with his wife.   Follow Up Recommendations  SNF;Supervision for mobility/OOB     Equipment Recommendations  None recommended by PT(TBD at next venue)    Recommendations for Other Services       Precautions / Restrictions Precautions Precautions: Fall Restrictions Weight Bearing Restrictions: No    Mobility  Bed Mobility Overal bed mobility: Needs Assistance Bed Mobility: Supine to Sit     Supine to sit: Mod assist     General bed mobility comments: Mod assistance to advance LEs to edge of bed and elevate trunk into a seated position.  Transfers Overall transfer level: Needs assistance Equipment used: Rolling walker (2 wheeled) Transfers: Sit to/from Stand Sit to Stand: Mod assist;+2 safety/equipment         General transfer comment: Cues for hand placement to and from seated surface.  Pt with poor eccentric load returning to recliner chair.  Ambulation/Gait Ambulation/Gait assistance: Mod assist;Max assist Gait Distance (Feet): 6 Feet Assistive device: Rolling walker (2 wheeled) Gait Pattern/deviations: Step-to pattern;Shuffle;Trunk flexed;Wide base of support     General Gait Details: Cues for upper trunk control and stepping closer to device.   Stairs             Wheelchair Mobility    Modified Rankin (Stroke Patients Only)        Balance Overall balance assessment: Needs assistance   Sitting balance-Leahy Scale: Fair       Standing balance-Leahy Scale: Poor                              Cognition Arousal/Alertness: Lethargic;Suspect due to medications Behavior During Therapy: [facility] for tasks assessed/performed Overall Cognitive Status: Impaired/Different from baseline Area of Impairment: Orientation;Attention;Following commands;Safety/judgement;Awareness;Problem solving                 Orientation Level: Disoriented to;Place;Time;Situation Current Attention Level: Focused Memory: Decreased short-term memory;Decreased recall of precautions Following Commands: Follows one step commands inconsistently;Follows one step commands with increased time Safety/Judgement: Decreased awareness of safety;Decreased awareness of deficits Awareness: Intellectual Problem Solving: Requires verbal cues        Exercises General Exercises - Lower Extremity Ankle Circles/Pumps: AROM;Both;10 reps;Supine Long Arc Quad: AROM;Both;10 reps;Seated Hip ABduction/ADduction: AROM;Both;10 reps;Supine Hip Flexion/Marching: AROM;Both;10 reps;Seated    General Comments        Pertinent Vitals/Pain Pain Assessment: Faces Faces Pain Scale: No hurt    Home Living                      Prior Function            PT Goals (current goals can now be found in the care plan section) Acute Rehab PT Goals Patient Stated Goal: did not state Potential to Achieve Goals: Fair Progress towards PT goals: Progressing toward goals    Frequency  Min 3X/week      PT Plan Current plan remains appropriate    Co-evaluation              AM-PAC PT "6 Clicks" Mobility   Outcome Measure                   End of Session Equipment Utilized During Treatment: Gait belt Activity Tolerance: Patient limited by lethargy Patient left: in chair;with call bell/phone within reach;with chair alarm set Nurse  Communication: Mobility status;Need for lift equipment PT Visit Diagnosis: Other abnormalities of gait and mobility (R26.89);Unsteadiness on feet (R26.81);Repeated falls (R29.6);Muscle weakness (generalized) (M62.81);Pain;Other symptoms and signs involving the nervous system (R29.898);Hemiplegia and hemiparesis Hemiplegia - Right/Left: Right Hemiplegia - dominant/non-dominant: Dominant Hemiplegia - caused by: Nontraumatic SAH     Time: [id number] PT Time Calculation (min) (ACUTE ONLY): 23 min  Charges:                        Jarvis Morgan Acute Rehabilitation Services Pager [phone] Office [phone]     Tommy Green Tommy Green 11/12/2019, 3:00 PM

## 2019-11-12 NOTE — Progress Notes (Addendum)
[facility]  PROGRESS NOTE  Iren Longmore Schonberger [id number] DOB: [birth year] DOA: 10/24/2019 PCP: [name], DO   Brief HPI:   74 year old male who suffered blunt head trauma several weeks ago initially without changes on imaging however developed confusion, aphasia, mobility issues to the point where he could no longer perform ADLs.  Presented to [facility] on 10/24/2019 and CT head showed loculated left side SDH with mass-effect.  He was transferred to [facility] for neurosurgical evaluation.  On 10/25/2019 he was taken to the OR for craniotomy and evacuation of SDH.  PCCM was consulted for vent management postop however patient was extubated in PACU prior to transfer to ICU.  He was seen by Dr. Festus Holts on / 8 and signed off.  Patient had increased respiratory distress and altered mental status on 10/27/2019 and PCCM was reconsulted.  He completed 5 days Rocephin for H. influenzae pneumonia and PCCM signed off on 11/05/2019.  No further neurosurgical interventions. Patient transferred to [facility] service on 11/08/2019.    Subjective   Patient seen and examined, continues to be in restraints.  Delirious.  Sodium has improved to 145.   Assessment/Plan:     1. Acute encephalopathy-secondary to hyperactive delirium in setting of SDH.  Slowly improving.  Patient is off Precedex, started on Seroquel.  Ammonia level 41.  Also started on thiamine 100 mg p.o. daily.  He is still in restraints.  Fluctuating mental status.  He is confused today.  Sodium has improved 145 with D5W. 2. Subdural hematoma s/p evacuation via craniotomy-neurosurgery following, continue Keppra. 3. Acute respiratory failure with hypoxemia-required intubation H influenza pneumonia, he was extubated on 11/04/2019.  Completed 5 days of Rocephin for pneumonia.  Currently on 3 L/min oxygen via nasal cannula. 4. Dysphagia-patient was on tube feeds, swallow evaluation obtained.  Patient started on dysphagia 1 diet. 5. Acute kidney injury  on CKD stage II-resolved 6. Hypernatremia-resolved, sodium was 156, started on D5W at 100 mL/h.  Today sodium is down to 145.  Will discontinue D5W and start him on D5 half-normal saline at 75 mL/h.  Patient's blood glucose remained stable on D5W.  He has poor p.o. intake.   7. Diabetes mellitus type 2-switch sliding scale insulin to before every meal and at bedtime, blood glucose is better controlled, continue Lantus 30 units  subcu daily. 8. Hypokalemia-replete   SpO2: 98 % O2 Flow Rate (L/min): 3 L/min FiO2 (%): 21 %    Lab Results  Component Value Date   SARSCOV2NAA NEGATIVE 10/24/2019     CBG: Recent Labs  Lab 11/11/19 1644 11/11/19 2007 11/12/19 0023 11/12/19 0403 11/12/19 0815  GLUCAP 256* 257* 214* 158* 142*    CBC: Recent Labs  Lab 11/06/19 0431 11/07/19 0310 11/08/19 0440  WBC 15.7* 14.2* 15.4*  HGB 8.0* 8.3* 8.7*  HCT 26.4* 27.0* 29.2*  MCV 85.2 85.4 86.4  PLT 299 324 0000000    Basic Metabolic Panel: Recent Labs  Lab 11/08/19 0440 11/09/19 0449 11/10/19 0255 11/11/19 0528 11/12/19 0256  NA 154* 152* 156* 153* 145  K 3.6 3.5 3.4* 3.7 3.5  CL 114* 115* 119* 117* 109  CO2 28 25 25 24 24   GLUCOSE 143* 191* 144* 169* 180*  BUN 28* 34* 35* 30* 24*  CREATININE 1.07 1.24 1.39* 1.24 1.24  CALCIUM 8.7* 8.6* 8.8* 8.6* 8.4*     DVT prophylaxis: Lovenox, started by Neurosurgery on 10/29/19.  Called and discussed with nurse practitioner Maryam on call, she agrees with stopping  Lovenox at this time.  Will start SCDs.  She will discussed with Dr. Duffy Rhody in a.m regarding DVT prophylaxis.  Code Status: Full code  Family Communication: No family at bedside  Disposition Plan: Barrier to discharge-patient is still in restraints with fluctuating mental status.      Scheduled medications:  . chlorhexidine  15 mL Mouth Rinse BID  . Chlorhexidine Gluconate Cloth  6 each Topical Daily  . docusate  100 mg Oral BID  . enoxaparin (LOVENOX) injection  60 mg  Subcutaneous Q24H  . gabapentin  600 mg Oral Q8H  . insulin aspart  0-20 Units Subcutaneous Q4H  . insulin glargine  30 Units Subcutaneous QHS  . mouth rinse  15 mL Mouth Rinse q12n4p  . QUEtiapine  25 mg Oral QPM  . senna-docusate  1 tablet Oral BID  . thiamine  100 mg Oral Daily    Consultants:  Neurosurgery  Procedures:  Craniotomy with evacuation of SDH  Antibiotics:   Anti-infectives (From admission, onward)   Start     Dose/Rate Route Frequency Ordered Stop   11/01/19 0900  cefTRIAXone (ROCEPHIN) 2 g in sodium chloride 0.9 % 100 mL IVPB     2 g 200 mL/hr over 30 Minutes Intravenous Every 24 hours 11/01/19 0855 11/05/19 2006   10/26/19 0000  ceFAZolin (ANCEF) IVPB 1 g/50 mL premix     1 g 100 mL/hr over 30 Minutes Intravenous Every 8 hours 10/25/19 2005 10/26/19 1754   10/25/19 1600  bacitracin 50,000 Units in sodium chloride 0.9 % 500 mL irrigation  Status:  Discontinued       As needed 10/25/19 1600 10/25/19 1825   10/25/19 0800  ceFAZolin (ANCEF) IVPB 2g/100 mL premix  Status:  Discontinued     2 g 200 mL/hr over 30 Minutes Intravenous 30 min pre-op 10/25/19 0459 10/25/19 2000       Objective   Vitals:   11/12/19 0703 11/12/19 0751 11/12/19 0752 11/12/19 1110  BP:  131/70    Pulse: (!) 58 75    Resp:      Temp:   98.2 F (36.8 C) 97.7 F (36.5 C)  TempSrc:   Oral Oral  SpO2: 98% 98%    Weight:      Height:        Intake/Output Summary (Last 24 hours) at 11/12/2019 1119 Last data filed at 11/12/2019 1020 Gross per 24 hour  Intake 976 ml  Output 2325 ml  Net -1349 ml    01/23 1901 - 01/25 0700 In: 2932.6 [P.O.:1394; I.V.:1538.6] Out: 2950 [Urine:2950]  Filed Weights   11/08/19 0346 11/10/19 0424 11/12/19 0700  Weight: 106 kg 115.2 kg 118.4 kg    Physical Examination:  General-appears in no acute distress Heart-S1-S2, regular, no murmur auscultated Lungs-clear to auscultation bilaterally, no wheezing or crackles auscultated Abdomen-soft,  nontender, no organomegaly Extremities-no edema in the lower extremities Neuro-somnolent, but arousable, confused.  Moving all extremities.     Admission status: Inpatient: Based on patients clinical presentation and evaluation of above clinical data, I have made determination that patient meets Inpatient criteria at this time.   Oswald Hillock   [facility] If 7PM-7AM, please contact night-coverage at [email or web address], Office  [phone]  password [email or web address]  11/12/2019, 11:19 AM  LOS: 19 days

## 2019-11-12 NOTE — Progress Notes (Signed)
  Speech Language Pathology Treatment: Dysphagia  Patient Details Name: Tommy Green MRN: [id number] DOB: [birth year] Today's Date: 11/12/2019 Time: [id number] SLP Time Calculation (min) (ACUTE ONLY): 24 min  Assessment / Plan / Recommendation Clinical Impression  Note: patient seen 11:47-12:11, ST returned to resume session 4:00-4:23pm.  Pt seen at bedside, wife present. Pt somewhat sleepy, able to be roused. Pt seen with puree solids and NTL. Wife provided education re: aspiration precautions, rationale for currently prescribed diet. She verbalized understanding. Wife feeding patient small bites of puree solids and small cup sips of NTL after ST education. Pt with no overt s/s aspiration with puree or NTL. ST did not provide trials of upgraded solids and session ended as patient appearing to becoming increasingly sleepy. ST returned in afternoon, wife no longer present. Patient seen with puree solids, NTL and trials of chopped solids (chopped peaches). Pt with mildly prolonged mastication and mildly prolonged AP transfer prior to initiation of the swallow. Swallow appeared timely, no overt s/s aspiration seen with bites of peaches, good oral clearance achieved. Recommend diet upgrade to dysphagia 2. ST educated patient and RN re: diet upgrade, ensuring pt is alert and upright, provide him small bites and sips, supervise and check oral cavity for any pocketed material. RN verbalized understanding. Sign posted at bedside. ST to follow acutely.    HPI HPI: Pt is a 74 y.o. male who has a PMH w prior CVA, diabetes, HLD, and HTN. He suffered blunt head trauma a few weeks back and had no concerning findings on imaging.  Subsequently developed confusion, aphasia, mobility problems to the point he could no longer perform any ADLs. He presented to [facility] ED 1/6 and CT head showed loculated AoC left SDH with mass effect. Pt is now post evacuation of SDH on 1/7.  ETT 1/7 for procedure and again 1/11-1/17. Swallow  eval 1/8 recommended NPO due to mentation and moderate-severe oral deficits.      SLP Plan          Recommendations  Diet recommendations: Dysphagia 2 (fine chop);Nectar-thick liquid Liquids provided via: Cup Medication Administration: Crushed with puree Supervision: Full supervision/cueing for compensatory strategies Compensations: Minimize environmental distractions;Slow rate;Small sips/bites Postural Changes and/or Swallow Maneuvers: Seated upright 90 degrees;Upright 30-60 min after meal                Oral Care Recommendations: Oral care QID;Staff/trained caregiver to provide oral care Follow up Recommendations: Skilled Nursing facility;24 hour supervision/assistance SLP Visit Diagnosis: Dysphagia, oropharyngeal phase (R13.12)       Admire, M.Ed., [place] Therapy Acute Rehabilitation [phone]: Acute Rehab office [phone] - pager    Tommy Green 11/12/2019, 4:26 PM

## 2019-11-12 NOTE — Progress Notes (Signed)
Neurosurgery note  Given patient's immobility and and numerous other risk factors for DVT/PE and postop scans showing good evacuation, benefits of prophylactic lovenox exceed risks.  Recommend continuing lovenox.

## 2019-11-12 NOTE — TOC Progression Note (Signed)
Transition of Care [facility]) - Progression Note    Patient Details  Name: Derwyn Heidebrecht [place] MRN: [id number] Date of Birth: 02-Dec-1945  Transition of Care [facility]) CM/SW [place], [place] Phone Number: 11/12/2019, 11:16 AM  Clinical Narrative:    CSW has sent updated therapy note through to [facility] for review at their request. Pt also down for PT assessment again today, will send when available.    Expected Discharge Plan: [place] Barriers to Discharge: Continued Medical Work up  Expected Discharge Plan and Services Expected Discharge Plan: [place] arrangements for the past 2 months: Single Family Home  Readmission Risk Interventions No flowsheet data found.

## 2019-11-13 LAB — GLUCOSE, CAPILLARY
Glucose-Capillary: 139 mg/dL — ABNORMAL HIGH (ref 70–99)
Glucose-Capillary: 150 mg/dL — ABNORMAL HIGH (ref 70–99)
Glucose-Capillary: 158 mg/dL — ABNORMAL HIGH (ref 70–99)
Glucose-Capillary: 164 mg/dL — ABNORMAL HIGH (ref 70–99)
Glucose-Capillary: 212 mg/dL — ABNORMAL HIGH (ref 70–99)
Glucose-Capillary: 222 mg/dL — ABNORMAL HIGH (ref 70–99)
Glucose-Capillary: 225 mg/dL — ABNORMAL HIGH (ref 70–99)

## 2019-11-13 LAB — BASIC METABOLIC PANEL
Anion gap: 12 (ref 5–15)
BUN: 21 mg/dL (ref 8–23)
CO2: 23 mmol/L (ref 22–32)
Calcium: 8 mg/dL — ABNORMAL LOW (ref 8.9–10.3)
Chloride: 108 mmol/L (ref 98–111)
Creatinine, Ser: 1.3 mg/dL — ABNORMAL HIGH (ref 0.61–1.24)
GFR calc Af Amer: >60 mL/min (ref >60)
GFR calc non Af Amer: 53 mL/min — ABNORMAL LOW (ref >60)
Glucose, Bld: 168 mg/dL — ABNORMAL HIGH (ref 70–99)
Potassium: 3.9 mmol/L (ref 3.5–5.1)
Sodium: 143 mmol/L (ref 135–145)

## 2019-11-13 NOTE — TOC Progression Note (Signed)
Transition of Care [facility]) - Progression Note    Patient Details  Name: Valor Koppes [place] MRN: [id number] Date of Birth: [birth year]  Transition of Care [facility]) CM/SW [place], [place] Phone Number: 11/13/2019, 1:29 PM  Clinical Narrative:    [facility] following, one additional SNF offer is [facility]. Noted that pt out of restraints however still has mitts, mitts are considered restraints for SNF placement purposes. Will need those removed for 24 hours for SNF placement.    Expected Discharge Plan: [place] Barriers to Discharge: Continued Medical Work up  Expected Discharge Plan and Services Expected Discharge Plan: [place] arrangements for the past 2 months: Single Family Home  Readmission Risk Interventions No flowsheet data found.

## 2019-11-13 NOTE — Progress Notes (Signed)
  Speech Language Pathology Treatment: Dysphagia  Patient Details Name: Tommy Green MRN: [id number] DOB: 12-24-1945 Today's Date: 11/13/2019 Time: [id number] SLP Time Calculation (min) (ACUTE ONLY): 22 min  Assessment / Plan / Recommendation Clinical Impression  Pt was seen for tolerance of breakfast meal after upgrade to chopped solids on previous date. Pt had prolonged mastication but good oral clearance. SLP also assisted by mixing drier solids (eggs, sausage) with purees to mildly improve time for oral transit. Note that pt's voice remain hoarse and breathy, and he is only able to get ~2-3 words out per breath. Subjectively, his voice is not improved since the last time he was seen by this SLP and he describes it as different from his norm. Recommend considering RMT as well as advanced trials of thin liquids on subsequent visit. If repeat instrumental evaluation is needed, could consider FEES to better assess laryngeal integrity.    HPI HPI: Pt is a 74 y.o. male who has a PMH w prior CVA, diabetes, HLD, and HTN. He suffered blunt head trauma a few weeks back and had no concerning findings on imaging.  Subsequently developed confusion, aphasia, mobility problems to the point he could no longer perform any ADLs. He presented to [facility] ED 1/6 and CT head showed loculated AoC left SDH with mass effect. Pt is now post evacuation of SDH on 1/7.  ETT 1/7 for procedure and again 1/11-1/17. Swallow eval 1/8 recommended NPO due to mentation and moderate-severe oral deficits.      SLP Plan  Continue with current plan of care       Recommendations  Diet recommendations: Dysphagia 2 (fine chop);Nectar-thick liquid Liquids provided via: Cup Medication Administration: Crushed with puree Supervision: Full supervision/cueing for compensatory strategies Compensations: Minimize environmental distractions;Slow rate;Small sips/bites Postural Changes and/or Swallow Maneuvers: Seated upright 90  degrees;Upright 30-60 min after meal                Oral Care Recommendations: Oral care BID Follow up Recommendations: Skilled Nursing facility;24 hour supervision/assistance Plan: Continue with current plan of care       GO                 Osie Bond., M.A. [place] Acute Rehabilitation Services Pager [phone] Office [phone]  11/13/2019, 10:25 AM

## 2019-11-13 NOTE — Progress Notes (Signed)
Physical Therapy Evaluation Patient Details Name: Tommy Green MRN: [id number] DOB: 04/09/1946 Today's Date: 11/13/2019   History of Present Illness  74 yo male admitted with slurred speech and weakness and recent falls. chronic L SDH with mass effect (had blunt force trauma a few weeks PTA); craniotomy 10/25/19, intubated 1/11-extubated 11/04/19 PMH CVA DM with significant bil LE neuropathy HLD HTN CKD  Clinical Impression  Pt continuing to require assist of 2 for transfers and ambulation.  Required cues for use of RW and for safe transfer techniques.  Pt sits abruptly and requires close chair follow or close chair with transfers.  Cont POC.     Follow Up Recommendations SNF    Equipment Recommendations  Other (comment)(TBD next venue)    Recommendations for Other Services       Precautions / Restrictions Precautions Precautions: Fall      Mobility  Bed Mobility Overal bed mobility: Needs Assistance         Sit to supine: Supervision   General bed mobility comments: Pt was supervision for sit to supine but required mod A for repositioning and increased cues  Transfers Overall transfer level: Needs assistance Equipment used: Rolling walker (2 wheeled) Transfers: Sit to/from Stand Sit to Stand: Mod assist;+2 safety/equipment Stand pivot transfers: Mod assist;+2 physical assistance;From elevated surface       General transfer comment: performed x 5; cues for hand placement and use of momentum to stand.  Poor control with return to sitting; abruptly sits -needs close chair follow  Ambulation/Gait Ambulation/Gait assistance: Mod assist;+2 safety/equipment;+2 physical assistance Gait Distance (Feet): 4 Feet Assistive device: Rolling walker (2 wheeled) Gait Pattern/deviations: Step-to pattern;Trunk flexed;Wide base of support;Shuffle     General Gait Details: difficulty advancing R LE; mod A for balance; assist for RW; very close chair follow as pt sits abruptly;   ambulated 4' and then 2'x3 for transfers (to bsc, to recliner, back to bed)  Science writer    Modified Rankin (Stroke Patients Only)       Balance Overall balance assessment: Needs assistance Sitting-balance support: Feet supported;Bilateral upper extremity supported Sitting balance-Leahy Scale: Fair     Standing balance support: Bilateral upper extremity supported;During functional activity Standing balance-Leahy Scale: Poor                               Pertinent Vitals/Pain Pain Assessment: No/denies pain    Home Living                        Prior Function                 Hand Dominance        Extremity/Trunk Assessment                Communication      Cognition Arousal/Alertness: Awake/alert Behavior During Therapy: WFL for tasks assessed/performed Overall Cognitive Status: Within Functional Limits for tasks assessed                                        General Comments General comments (skin integrity, edema, etc.): vss - occasionally O2 reading would drop to 75% with use of RW but when had pt relax his hand reading back to 99%  Exercises General Exercises - Lower Extremity Ankle Circles/Pumps: AROM;20 reps;Both;Seated Long Arc Quad: AROM;20 reps;Both;Seated Hip Flexion/Marching: AROM;20 reps;Both;Seated   Assessment/Plan    PT Assessment    PT Problem List         PT Treatment Interventions      PT Goals (Current goals can be found in the Care Plan section)       Frequency Min 3X/week   Barriers to discharge        Co-evaluation               AM-PAC PT "6 Clicks" Mobility  Outcome Measure Help needed turning from your back to your side while in a flat bed without using bedrails?: A Lot Help needed moving from lying on your back to sitting on the side of a flat bed without using bedrails?: A Lot Help needed moving to and from a bed to a chair  (including a wheelchair)?: Total Help needed standing up from a chair using your arms (e.g., wheelchair or bedside chair)?: A Lot Help needed to walk in hospital room?: Total Help needed climbing 3-5 steps with a railing? : Total 6 Click Score: 9    End of Session Equipment Utilized During Treatment: Gait belt Activity Tolerance: Patient limited by lethargy Patient left: in bed;with family/visitor present;with bed alarm set;with call bell/phone within reach Nurse Communication: Mobility status;Need for lift equipment PT Visit Diagnosis: Other abnormalities of gait and mobility (R26.89);Unsteadiness on feet (R26.81);Repeated falls (R29.6);Muscle weakness (generalized) (M62.81);Pain;Other symptoms and signs involving the nervous system (R29.898);Hemiplegia and hemiparesis    Time: 1430-1458 PT Time Calculation (min) (ACUTE ONLY): 28 min   Charges:     PT Treatments $Gait Training: 8-22 mins $Therapeutic Activity: 8-22 mins        Maggie Font, PT Acute Rehab Services Pager [phone] [place] [facility] [phone] [facility] [phone]   [name] 11/13/2019, 3:34 PM

## 2019-11-13 NOTE — Progress Notes (Signed)
[facility]  PROGRESS NOTE  Tommy Green [id number] DOB: May 10, 1946 DOA: 10/24/2019 PCP: [name], DO   Brief HPI:   74 year old male who suffered blunt head trauma several weeks ago initially without changes on imaging however developed confusion, aphasia, mobility issues to the point where he could no longer perform ADLs.  Presented to [facility] on 10/24/2019 and CT head showed loculated left side SDH with mass-effect.  He was transferred to [facility] for neurosurgical evaluation.  On 10/25/2019 he was taken to the OR for craniotomy and evacuation of SDH.  PCCM was consulted for vent management postop however patient was extubated in PACU prior to transfer to ICU.  He was seen by Dr. Festus Holts on / 8 and signed off.  Patient had increased respiratory distress and altered mental status on 10/27/2019 and PCCM was reconsulted.  He completed 5 days Rocephin for H. influenzae pneumonia and PCCM signed off on 11/05/2019.  No further neurosurgical interventions. Patient transferred to [facility] service on 11/08/2019.    Subjective   Patient seen and examined, continues to be confused.  Restraints taken off, patient in mittens.   Assessment/Plan:     1. Acute encephalopathy-secondary to hyperactive delirium in setting of SDH.  Fluctuating course.    Patient is off Precedex, started on Seroquel.  Ammonia level 41.  Also started on thiamine 100 mg p.o. daily.  He is off restraints, mittens in place.  Continues to be confused.  Sodium has improved to 143.  D5W was changed to D5 half-normal saline +10 mEq KCl at 75 mill per hour.  2. Subdural hematoma s/p evacuation via craniotomy-neurosurgery has signed off, patient was on IV Keppra from January 7 to January 17th, Keppra was discontinued per neurosurgery.  3. Acute respiratory failure with hypoxemia-required intubation H influenza pneumonia, he was extubated on 11/04/2019.  Completed 5 days of Rocephin for pneumonia.  Currently on 3 L/min oxygen  via nasal cannula.  4. Dysphagia-patient was on tube feeds, swallow evaluation obtained.  Patient started on dysphagia 2 diet.  5. Acute kidney injury on CKD stage II-resolved  6. Hypernatremia-resolved, sodium was 156, started on D5W at 100 mL/h.  Today sodium is down to 143.  D5W was discontinued.  And patient started on D5 half-normal saline at 75 mL/h.  Patient's blood glucose remained stable on D5W.  He has poor p.o. intake.    7. Diabetes mellitus type 2-switch sliding scale insulin to before every meal and at bedtime, blood glucose is better controlled, continue Lantus 30 units subcu daily.  8. Hypokalemia-replete   SpO2: 96 % O2 Flow Rate (L/min): 2 L/min FiO2 (%): 21 %    Lab Results  Component Value Date   SARSCOV2NAA NEGATIVE 10/24/2019     CBG: Recent Labs  Lab 11/12/19 1950 11/12/19 2203 11/13/19 0016 11/13/19 0416 11/13/19 0901  GLUCAP 153* 142* 158* 164* 139*    CBC: Recent Labs  Lab 11/07/19 0310 11/08/19 0440  WBC 14.2* 15.4*  HGB 8.3* 8.7*  HCT 27.0* 29.2*  MCV 85.4 86.4  PLT 324 0000000    Basic Metabolic Panel: Recent Labs  Lab 11/09/19 0449 11/10/19 0255 11/11/19 0528 11/12/19 0256 11/13/19 0254  NA 152* 156* 153* 145 143  K 3.5 3.4* 3.7 3.5 3.9  CL 115* 119* 117* 109 108  CO2 25 25 24 24 23   GLUCOSE 191* 144* 169* 180* 168*  BUN 34* 35* 30* 24* 21  CREATININE 1.24 1.39* 1.24 1.24 1.30*  CALCIUM 8.6* 8.8*  8.6* 8.4* 8.0*     DVT prophylaxis: Lovenox, started by Neurosurgery on 10/29/19.  Neurosurgeon Dr. Marcello Moores recommends continuing with Lovenox.  Code Status: Full code  Family Communication: No family at bedside  Disposition Plan: Barrier to discharge-patient is still in restraints with fluctuating mental status.      Scheduled medications:  . chlorhexidine  15 mL Mouth Rinse BID  . Chlorhexidine Gluconate Cloth  6 each Topical Daily  . docusate  100 mg Oral BID  . enoxaparin (LOVENOX) injection  60 mg Subcutaneous Q24H   . gabapentin  600 mg Oral Q8H  . insulin aspart  0-20 Units Subcutaneous Q4H  . insulin glargine  30 Units Subcutaneous QHS  . mouth rinse  15 mL Mouth Rinse q12n4p  . QUEtiapine  25 mg Oral QPM  . senna-docusate  1 tablet Oral BID  . thiamine  100 mg Oral Daily    Consultants:  Neurosurgery  Procedures:  Craniotomy with evacuation of SDH  Antibiotics:   Anti-infectives (From admission, onward)   Start     Dose/Rate Route Frequency Ordered Stop   11/01/19 0900  cefTRIAXone (ROCEPHIN) 2 g in sodium chloride 0.9 % 100 mL IVPB     2 g 200 mL/hr over 30 Minutes Intravenous Every 24 hours 11/01/19 0855 11/05/19 2006   10/26/19 0000  ceFAZolin (ANCEF) IVPB 1 g/50 mL premix     1 g 100 mL/hr over 30 Minutes Intravenous Every 8 hours 10/25/19 2005 10/26/19 1754   10/25/19 1600  bacitracin 50,000 Units in sodium chloride 0.9 % 500 mL irrigation  Status:  Discontinued       As needed 10/25/19 1600 10/25/19 1825   10/25/19 0800  ceFAZolin (ANCEF) IVPB 2g/100 mL premix  Status:  Discontinued     2 g 200 mL/hr over 30 Minutes Intravenous 30 min pre-op 10/25/19 0459 10/25/19 2000       Objective   Vitals:   11/13/19 0004 11/13/19 0404 11/13/19 0500 11/13/19 0845  BP: 134/69 136/63  131/73  Pulse: 71   80  Resp: 20 (!) 22  15  Temp: 97.6 F (36.4 C) 98.4 F (36.9 C)  99 F (37.2 C)  TempSrc: Oral Oral  Oral  SpO2: 97%   96%  Weight:   119.7 kg   Height:        Intake/Output Summary (Last 24 hours) at 11/13/2019 1142 Last data filed at 11/13/2019 0900 Gross per 24 hour  Intake 808.79 ml  Output 1350 ml  Net -541.21 ml    01/24 1901 - 01/26 0700 In: 804.8 [P.O.:476; I.V.:328.8] Out: 2925 [Urine:2925]  Filed Weights   11/10/19 0424 11/12/19 0700 11/13/19 0500  Weight: 115.2 kg 118.4 kg 119.7 kg    Physical Examination:  General-appears in no acute distress Heart-S1-S2, regular, no murmur auscultated Lungs-clear to auscultation bilaterally, no wheezing or  crackles auscultated Abdomen-soft, nontender, no organomegaly Neuro-somnolent but arousable, continues to be confused.  Off restraints this morning but in mittens.     Admission status: Inpatient: Based on patients clinical presentation and evaluation of above clinical data, I have made determination that patient meets Inpatient criteria at this time.   Oswald Hillock   [facility] If 7PM-7AM, please contact night-coverage at [email or web address], Office  [phone]  password [email or web address]  11/13/2019, 11:42 AM  LOS: 20 days

## 2019-11-13 NOTE — Progress Notes (Signed)
Pt. Refused bipap. Pt. States he just wants to wear his cannula.

## 2019-11-14 LAB — GLUCOSE, CAPILLARY
Glucose-Capillary: 150 mg/dL — ABNORMAL HIGH (ref 70–99)
Glucose-Capillary: 111 mg/dL — ABNORMAL HIGH (ref 70–99)
Glucose-Capillary: 203 mg/dL — ABNORMAL HIGH (ref 70–99)
Glucose-Capillary: 157 mg/dL — ABNORMAL HIGH (ref 70–99)
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
## 2019-11-14 NOTE — Progress Notes (Signed)
PROGRESS NOTE  Tommy Green [id number] DOB: 10/01/1946 DOA: 10/24/2019 PCP: [name], DO   LOS: 21 days   Brief narrative:  As per HPI,  74 year old male with past medical history of type 2 diabetes presented to the hospital with confusion, difficulty ambulating.  He did have history of blunt trauma to the head several weeks ago prior to presentation.  Patient presented to [facility]  on 10/24/2019 and CT head showed loculated left sided SDH with mass-effect.  He was transferred to [facility] for neurosurgical evaluation.  On 10/25/2019 he was taken to the OR for craniotomy and evacuation of SDH.  PCCM was consulted for vent management postop however patient was extubated in PACU prior to transfer to ICU.  Patient had increased respiratory distress and altered mental status on 10/27/2019 and PCCM was reconsulted.  Patient completed 5 day course of Rocephin for H. influenzae pneumonia and PCCM signed off on 11/05/2019.  No further neurosurgical interventions were planned. Patient transferred to [facility] service on 11/08/2019.  Assessment/Plan:  Principal Problem:   SDH (subdural hematoma) (HCC) Active Problems:   Respiratory failure (HCC)   Acute encephalopathy   Hypernatremia   Dyspnea   Acute respiratory failure with hypoxemia (HCC)   Acute kidney injury superimposed on chronic kidney disease (HCC)  Acute metabolic encephalopathy with hyperactive delirium.  Currently alert awake oriented to place and person.  We will try to discontinue mittens today.  Patient did have fluctuating delirium during hospitalization and was briefly on Precedex and was started on Seroquel.  Ammonia level was 41.  Patient was also started on thiamine.   Sodium levels have improved as well.  Subdural hematoma s/p evacuation via craniotomy-neurosurgery has signed off, patient was on IV Keppra from January 7 to January 17th, Keppra was discontinued per neurosurgery.  Acute respiratory failure with  hypoxemia-with pneumonia.  Required intubation , he was extubated on 11/04/2019.    H influenzae pneumonia.  Completed 5-day course of Rocephin for pneumonia.  Currently on 2 L of oxygen by nasal cannula.  Dysphagia- has been seen by speech therapy and currently on dysphagia 2 diet.  Acute kidney injury on CKD stage II-resolved.  Latest creatinine level 1.3  Hypernatremia-with D5 half-normal saline.  On insulin regimen as well.  Continue fluids for 1 more day.  Diabetes mellitus type 2-now on D5 half-normal saline due to poor oral intake.   Continue Lantus 30 units subcu daily.  Currently on dysphagia 2 diet.  Might have to adjust insulin doses once patient is off IV fluids.  Hypokalemia-improved.  VTE Prophylaxis: Lovenox subcu as recommended by neurosurgery  Code Status: Full code  Family Communication: I tried to call the patient's spouse on the phone available but was unable to reach her.  Disposition Plan: Physical  therapy recommended skilled nursing facility placement.  Likely to skilled nursing facility by tomorrow.  Transition of care on board.  Patient was still on mittens which has been discontinued today.    Consultants:  Neurosurgery  Procedures:  10/25/2019  craniotomy and evacuation of SDH by neurosurgery  Intubation on 111 and extubation 11/04/2019  Antibiotics: . None currently  Anti-infectives (From admission, onward)   Start     Dose/Rate Route Frequency Ordered Stop   11/01/19 0900  cefTRIAXone (ROCEPHIN) 2 g in sodium chloride 0.9 % 100 mL IVPB     2 g 200 mL/hr over 30 Minutes Intravenous Every 24 hours 11/01/19 0855 11/05/19 2006   10/26/19 0000  ceFAZolin (ANCEF) IVPB  1 g/50 mL premix     1 g 100 mL/hr over 30 Minutes Intravenous Every 8 hours 10/25/19 2005 10/26/19 1754   10/25/19 1600  bacitracin 50,000 Units in sodium chloride 0.9 % 500 mL irrigation  Status:  Discontinued       As needed 10/25/19 1600 10/25/19 1825   10/25/19 0800  ceFAZolin  (ANCEF) IVPB 2g/100 mL premix  Status:  Discontinued     2 g 200 mL/hr over 30 Minutes Intravenous 30 min pre-op 10/25/19 0459 10/25/19 2000     Subjective: Today, patient alert awake and communicative.  Denies overt pain, nausea, vomiting or headache.  Objective: Vitals:   11/14/19 0810 11/14/19 1043  BP: (!) 142/74 (!) 157/70  Pulse: 81 71  Resp: 20 20  Temp: 97.6 F (36.4 C) 98.9 F (37.2 C)  SpO2: 94% 100%    Intake/Output Summary (Last 24 hours) at 11/14/2019 1357 Last data filed at 11/14/2019 0914 Gross per 24 hour  Intake --  Output 1775 ml  Net -1775 ml   Filed Weights   11/12/19 0700 11/13/19 0500 11/14/19 0448  Weight: 118.4 kg 119.7 kg 119.7 kg   Body mass index is 45.32 kg/m.   Physical Exam: GENERAL: Patient is alert awake and oriented to time and place. Not in obvious distress.  Morbidly obese HENT: No scleral pallor or icterus. Pupils equally reactive to light. Oral mucosa is moist NECK: is supple,  CHEST: Clear to auscultation. No crackles or wheezes.  Diminished breath sounds bilaterally. CVS: S1 and S2 heard, no murmur. Regular rate and rhythm.  ABDOMEN: Soft, non-tender, bowel sounds are present. EXTREMITIES: No edema.  Mittens in place. CNS: Alert, awake and communicative.  Mild left eyedroop.  Moving all extremities. SKIN: warm and dry without rashes.  Data Review: I have personally reviewed the following laboratory data and studies,  CBC: Recent Labs  Lab 11/08/19 0440  WBC 15.4*  HGB 8.7*  HCT 29.2*  MCV 86.4  PLT 0000000   Basic Metabolic Panel: Recent Labs  Lab 11/09/19 0449 11/10/19 0255 11/11/19 0528 11/12/19 0256 11/13/19 0254  NA 152* 156* 153* 145 143  K 3.5 3.4* 3.7 3.5 3.9  CL 115* 119* 117* 109 108  CO2 25 25 24 24 23   GLUCOSE 191* 144* 169* 180* 168*  BUN 34* 35* 30* 24* 21  CREATININE 1.24 1.39* 1.24 1.24 1.30*  CALCIUM 8.6* 8.8* 8.6* 8.4* 8.0*   Liver Function Tests: No results for input(s): AST, ALT, ALKPHOS,  BILITOT, PROT, ALBUMIN in the last 168 hours. No results for input(s): LIPASE, AMYLASE in the last 168 hours. No results for input(s): AMMONIA in the last 168 hours. Cardiac Enzymes: No results for input(s): CKTOTAL, CKMB, CKMBINDEX, TROPONINI in the last 168 hours. BNP (last 3 results) No results for input(s): BNP in the last 8760 hours.  ProBNP (last 3 results) No results for input(s): PROBNP in the last 8760 hours.  CBG: Recent Labs  Lab 11/13/19 1710 11/13/19 1951 11/13/19 2345 11/14/19 0425 11/14/19 0735  GLUCAP 222* 212* 150* 150* 111*   No results found for this or any previous visit (from the past 240 hour(s)).   Studies: No results found.   Flora Lipps, MD  [facility] 11/14/2019
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
## 2019-11-14 NOTE — TOC Progression Note (Signed)
Transition of Care [facility]) - Progression Note    Patient Details  Name: Tommy Green MRN: [id number] Date of Birth: 12-31-1945  Transition of Care [facility]) CM/SW [place], [place] Phone Number: 11/14/2019, 8:43 AM  Clinical Narrative:    CSW sent additional clinical to [facility] as pt has been restraint free.    Expected Discharge Plan: [place] Barriers to Discharge: Continued Medical Work up  Expected Discharge Plan and Services Expected Discharge Plan: [place] arrangements for the past 2 months: Single Family Home  Readmission Risk Interventions No flowsheet data found.

## 2019-11-14 NOTE — Progress Notes (Signed)
  Speech Language Pathology Treatment: Dysphagia  Patient Details Name: Tommy Green MRN: [id number] DOB: [birth year] Today's Date: 11/14/2019 Time: [id number] SLP Time Calculation (min) (ACUTE ONLY): 18 min  Assessment / Plan / Recommendation Clinical Impression  SLP provided advanced trials of thin liquids to assess for potential upgrade. Mod cues were provided for pt to drink three ounces consecutively, but pt still took multiple, subtle breaks throughout drinking. It also elicited immediate throat clearing that progressed into coughing. Given persistent changes in vocal quality and results of diagnostic trials, will keep current diet in place for now. He would benefit from repeat MBS prior to discharge to SNF though.   SLP also introduced EMST to facilitate breath support and strength for cough and swallowing. Respironics trainer was titrated to 17 cm H2O, at which resistance pt performed five sets of five repetitions without any overt signs of intolerance and only Min cues needed. He self-reported his effort level to be an 8 out of 10. Would continue to use EMST daily to facilitate safety and progression with swallowing.    HPI HPI: Pt is a 74 y.o. male who has a PMH w prior CVA, diabetes, HLD, and HTN. He suffered blunt head trauma a few weeks back and had no concerning findings on imaging.  Subsequently developed confusion, aphasia, mobility problems to the point he could no longer perform any ADLs. He presented to [facility] ED 1/6 and CT head showed loculated AoC left SDH with mass effect. Pt is now post evacuation of SDH on 1/7.  ETT 1/7 for procedure and again 1/11-1/17. Swallow eval 1/8 recommended NPO due to mentation and moderate-severe oral deficits.      SLP Plan  Continue with current plan of care       Recommendations  Diet recommendations: Dysphagia 2 (fine chop);Nectar-thick liquid Liquids provided via: Cup Medication Administration: Crushed with puree Supervision: Full  supervision/cueing for compensatory strategies Compensations: Minimize environmental distractions;Slow rate;Small sips/bites Postural Changes and/or Swallow Maneuvers: Seated upright 90 degrees;Upright 30-60 min after meal                Oral Care Recommendations: Oral care BID Follow up Recommendations: Skilled Nursing facility;24 hour supervision/assistance SLP Visit Diagnosis: Dysphagia, oropharyngeal phase (R13.12) Plan: Continue with current plan of care       GO                Tommy Green., M.A. [place] Acute Rehabilitation Services Pager [phone] Office [phone]  11/14/2019, 3:48 PM

## 2019-11-15 LAB — GLUCOSE, CAPILLARY
Glucose-Capillary: 139 mg/dL — ABNORMAL HIGH (ref 70–99)
Glucose-Capillary: 159 mg/dL — ABNORMAL HIGH (ref 70–99)
Glucose-Capillary: 161 mg/dL — ABNORMAL HIGH (ref 70–99)
Glucose-Capillary: 193 mg/dL — ABNORMAL HIGH (ref 70–99)
Glucose-Capillary: 195 mg/dL — ABNORMAL HIGH (ref 70–99)
Glucose-Capillary: 212 mg/dL — ABNORMAL HIGH (ref 70–99)

## 2019-11-15 LAB — BASIC METABOLIC PANEL
Anion gap: 9 (ref 5–15)
BUN: 15 mg/dL (ref 8–23)
CO2: 21 mmol/L — ABNORMAL LOW (ref 22–32)
Calcium: 8.2 mg/dL — ABNORMAL LOW (ref 8.9–10.3)
Chloride: 108 mmol/L (ref 98–111)
Creatinine, Ser: 1.05 mg/dL (ref 0.61–1.24)
GFR calc Af Amer: >60 mL/min (ref >60)
GFR calc non Af Amer: >60 mL/min (ref >60)
Glucose, Bld: 199 mg/dL — ABNORMAL HIGH (ref 70–99)
Potassium: 4 mmol/L (ref 3.5–5.1)
Sodium: 138 mmol/L (ref 135–145)

## 2019-11-15 LAB — SARS CORONAVIRUS 2 (TAT 6-24 HRS): SARS Coronavirus 2: NEGATIVE

## 2019-11-15 LAB — CBC
HCT: 27.7 % — ABNORMAL LOW (ref 39.0–52.0)
Hemoglobin: 8.7 g/dL — ABNORMAL LOW (ref 13.0–17.0)
MCH: 26.4 pg (ref 26.0–34.0)
MCHC: 31.4 g/dL (ref 30.0–36.0)
MCV: 84.2 fL (ref 80.0–100.0)
Platelets: 243 10*3/uL (ref 150–400)
RBC: 3.29 MIL/uL — ABNORMAL LOW (ref 4.22–5.81)
RDW: 15.4 % (ref 11.5–15.5)
WBC: 11.4 10*3/uL — ABNORMAL HIGH (ref 4.0–10.5)
nRBC: 0 % (ref 0.0–0.2)

## 2019-11-15 LAB — MAGNESIUM: Magnesium: 1.4 mg/dL — ABNORMAL LOW (ref 1.7–2.4)

## 2019-11-15 MED ORDER — HYDROCHLOROTHIAZIDE 25 MG PO TABS
25.0000 mg | ORAL_TABLET | Freq: Every day | ORAL | Status: DC
  Administered 2019-11-15 – 2019-11-16 (×2): 25 mg via ORAL
  Filled 2019-11-15 (×2): qty 1

## 2019-11-15 MED ORDER — LISINOPRIL 20 MG PO TABS
40.0000 mg | ORAL_TABLET | Freq: Every day | ORAL | Status: DC
  Administered 2019-11-15 – 2019-11-16 (×2): 40 mg via ORAL
  Filled 2019-11-15 (×2): qty 2

## 2019-11-15 MED ORDER — MAGNESIUM OXIDE 400 (241.3 MG) MG PO TABS
400.0000 mg | ORAL_TABLET | Freq: Two times a day (BID) | ORAL | Status: DC
  Administered 2019-11-15 – 2019-11-16 (×3): 400 mg via ORAL
  Filled 2019-11-15 (×3): qty 1

## 2019-11-15 MED ORDER — GABAPENTIN 300 MG PO CAPS
600.0000 mg | ORAL_CAPSULE | Freq: Three times a day (TID) | ORAL | Status: DC
  Administered 2019-11-15 – 2019-11-16 (×3): 600 mg via ORAL
  Filled 2019-11-15 (×3): qty 2

## 2019-11-15 MED ORDER — MAGNESIUM SULFATE 2 GM/50ML IV SOLN
2.0000 g | Freq: Once | INTRAVENOUS | Status: AC
  Administered 2019-11-15: 2 g via INTRAVENOUS
  Filled 2019-11-15: qty 50

## 2019-11-15 MED ORDER — ENSURE ENLIVE PO LIQD
237.0000 mL | Freq: Every day | ORAL | Status: DC
  Administered 2019-11-15: 18:00:00 237 mL via ORAL

## 2019-11-15 MED ORDER — CARVEDILOL 12.5 MG PO TABS
12.5000 mg | ORAL_TABLET | Freq: Two times a day (BID) | ORAL | Status: DC
  Administered 2019-11-15 – 2019-11-16 (×3): 12.5 mg via ORAL
  Filled 2019-11-15 (×3): qty 1

## 2019-11-15 NOTE — Progress Notes (Signed)
Physical Therapy Treatment Patient Details Name: Tommy Green MRN: [id number] DOB: [birth year] Today's Date: 11/15/2019    History of Present Illness 74 yo male admitted with slurred speech and weakness and recent falls. chronic L SDH with mass effect (had blunt force trauma a few weeks PTA); craniotomy 10/25/19, intubated 1/11-extubated 11/04/19 PMH CVA DM with significant bil LE neuropathy HLD HTN CKD    PT Comments    Pt performed gt training and functional mobility during session with close chair follow.  He was able to progress gt to hallway distance.  He continues to present with cognitive impairments and remains appropriate for SNF placement at d/c.      Follow Up Recommendations  SNF     Equipment Recommendations  Other (comment)(TBD at next venue)    Recommendations for Other Services       Precautions / Restrictions Precautions Precautions: Fall Restrictions Weight Bearing Restrictions: No    Mobility  Bed Mobility Overal bed mobility: Needs Assistance Bed Mobility: Supine to Sit;Sit to Supine     Supine to sit: Mod assist     General bed mobility comments: Pt required moderate assistance to elevate trunk into sitting and advance LEs to edge of bed.  Transfers Overall transfer level: Needs assistance Equipment used: Rolling walker (2 wheeled) Transfers: Sit to/from Stand Sit to Stand: Mod assist;+2 safety/equipment;From elevated surface         General transfer comment: Cues for hand placement to and from seated surface.  Pt with poor eccentric load returning to seated surface.  Ambulation/Gait Ambulation/Gait assistance: Mod assist;+2 safety/equipment Gait Distance (Feet): 20 Feet Assistive device: Rolling walker (2 wheeled) Gait Pattern/deviations: Trunk flexed;Wide base of support;Shuffle;Step-through pattern Gait velocity: decreased   General Gait Details: Cues for upper trunk control and assistance to maintain correct pathway of RW.  Very  unsteady with external assistance to maintain balance with close chair follow.   Stairs             Wheelchair Mobility    Modified Rankin (Stroke Patients Only)       Balance Overall balance assessment: Needs assistance   Sitting balance-Leahy Scale: Fair       Standing balance-Leahy Scale: Poor Standing balance comment: reliant on BUE support and external assist                            Cognition Arousal/Alertness: Awake/alert Behavior During Therapy: WFL for tasks assessed/performed Overall Cognitive Status: No family/caregiver present to determine baseline cognitive functioning Area of Impairment: Attention;Following commands;Safety/judgement;Awareness;Problem solving                 Orientation Level: Disoriented to;Place;Time;Situation Current Attention Level: Focused Memory: Decreased short-term memory;Decreased recall of precautions Following Commands: Follows one step commands inconsistently;Follows one step commands with increased time Safety/Judgement: Decreased awareness of safety;Decreased awareness of deficits Awareness: Intellectual Problem Solving: Requires verbal cues General Comments: Pt continues to sit prematurely, he is very tangential with conversation and perseverated on waiting for a ride to go somewhere.      Exercises      General Comments        Pertinent Vitals/Pain Pain Assessment: No/denies pain Faces Pain Scale: No hurt    Home Living                      Prior Function            PT Goals (current goals can  now be found in the care plan section) Acute Rehab PT Goals Patient Stated Goal: pt did not state Potential to Achieve Goals: Fair Progress towards PT goals: Progressing toward goals    Frequency    Min 3X/week      PT Plan Current plan remains appropriate    Co-evaluation              AM-PAC PT "6 Clicks" Mobility   Outcome Measure  Help needed turning from your  back to your side while in a flat bed without using bedrails?: A Lot Help needed moving from lying on your back to sitting on the side of a flat bed without using bedrails?: A Lot Help needed moving to and from a bed to a chair (including a wheelchair)?: A Lot Help needed standing up from a chair using your arms (e.g., wheelchair or bedside chair)?: A Lot Help needed to walk in hospital room?: A Lot Help needed climbing 3-5 steps with a railing? : Total 6 Click Score: 11    End of Session Equipment Utilized During Treatment: Gait belt Activity Tolerance: Patient tolerated treatment well Patient left: with call bell/phone within reach;in chair;with chair alarm set Nurse Communication: Mobility status PT Visit Diagnosis: Other abnormalities of gait and mobility (R26.89);Unsteadiness on feet (R26.81);Repeated falls (R29.6);Muscle weakness (generalized) (M62.81);Pain;Other symptoms and signs involving the nervous system (R29.898);Hemiplegia and hemiparesis Hemiplegia - Right/Left: Right Hemiplegia - dominant/non-dominant: Dominant Hemiplegia - caused by: Nontraumatic SAH     Time: [id number] PT Time Calculation (min) (ACUTE ONLY): 18 min  Charges:  $Gait Training: 8-22 mins                     Erasmo Leventhal , PTA Acute Rehabilitation Services Pager [phone] Office [phone]     Brady Schiller [name] 11/15/2019, 4:52 PM

## 2019-11-15 NOTE — TOC Progression Note (Signed)
Transition of Care [facility]) - Progression Note    Patient Details  Name: Tommy Green MRN: [id number] Date of Birth: 01-06-1946  Transition of Care [facility]) CM/SW [place], [place] Phone Number: 11/15/2019, 10:37 AM  Clinical Narrative:    Tommy Green has a bed for pt transfer tomorrow, contacted MD to request a new COVID screen. [employer] waiver in place, [facility] will start auth and can take pt even if not received tomorrow. Tommy Green admissions liaison Tommy Green will contact pt wife to complete admissions paperwork and inform her of bed offer.   CSW communicated this also to floor Tommy Green.    Expected Discharge Plan: [place] Barriers to Discharge: Continued Medical Work up  Expected Discharge Plan and Services Expected Discharge Plan: [place] arrangements for the past 2 months: Single Family Home                  Readmission Risk Interventions No flowsheet data found.

## 2019-11-15 NOTE — Plan of Care (Signed)

## 2019-11-15 NOTE — Progress Notes (Addendum)
PROGRESS NOTE  Tommy Green [id number] DOB: [birth year] DOA: 10/24/2019 PCP: [name], DO   LOS: 22 days   Brief narrative:  As per HPI,  74 year old male with past medical history of type 2 diabetes presented to the hospital with confusion, difficulty ambulating.  He did have history of blunt trauma to the head several weeks ago prior to presentation.  Patient presented to [facility]  on 10/24/2019 and CT head showed loculated left sided SDH with mass-effect.  He was transferred to [facility] for neurosurgical evaluation.  On 10/25/2019 he was taken to the OR for craniotomy and evacuation of SDH.  PCCM was consulted for vent management postop however patient was extubated in PACU prior to transfer to ICU.  Patient had increased respiratory distress and altered mental status on 10/27/2019 and PCCM was reconsulted.  Patient completed 5 day course of Rocephin for H. influenzae pneumonia and PCCM signed off on 11/05/2019.  No further neurosurgical interventions were planned. Patient transferred to [facility] service on 11/08/2019.  Assessment/Plan:  Principal Problem:   SDH (subdural hematoma) (HCC) Active Problems:   Respiratory failure (HCC)   Acute encephalopathy   Hypernatremia   Dyspnea   Acute respiratory failure with hypoxemia (HCC)   Acute kidney injury superimposed on chronic kidney disease (HCC)  Acute metabolic encephalopathy with hyperactive delirium.  Improved.  Currently alert awake oriented. Off mittens since yesterday.  Patient did have fluctuating delirium during hospitalization and was briefly on Precedex and was started on Seroquel.  Ammonia level was 41.  Patient was also started on thiamine.   Sodium levels have improved as well.  Subdural hematoma s/p evacuation via craniotomy-neurosurgery has signed off, patient was on IV Keppra from January 7 to January 17th, Keppra was discontinued per neurosurgery.  Acute respiratory failure with hypoxemia-with pneumonia.   Improved.  Required intubation , he was extubated on 11/04/2019.    Currently on room air.  Has completed antibiotic course.  H influenzae pneumonia.  Completed 5-day course of Rocephin for pneumonia.  On room air today.  Dysphagia- has been seen by speech therapy and currently on dysphagia 2 diet.  Will need speech evaluation after discharge to assess for progress.  Acute kidney injury on CKD stage II-resolved.  Latest creatinine level 1.05  Hypernatremia-received D5 half-normal saline.  On insulin regimen as well.  Currently off IV fluids.  Diabetes mellitus type 2-on Lantus 30 units subcu daily.  Currently on dysphagia 2 diet.  As per home medication patient was on Tresiba 60 and Metformin.  Hypokalemia-improved.  Consider potassium for few days on discharge.  Hypomagnesemia of 1.4 today.  Will give magnesium sulfate 2 g IV and p.o. magnesium oxide.  Could consider on discharge.  Hypertension.  Coreg has been restarted.  On lisinopril.  Will restart HCTZ today.  Closely monitor blood pressure.  VTE Prophylaxis: Lovenox subcu as recommended by neurosurgery  Code Status: Full code  Family Communication: I called the patient's spouse Ms [name] on the phone and updated her about the clinical condition of the patient and the plan for disposition to skilled nursing facility tomorrow.  Disposition Plan: Skilled nursing facility at [facility] by tomorrow.  Transition of care on board.     Consultants:  Neurosurgery   Procedures:  10/25/2019  craniotomy and evacuation of SDH by neurosurgery  Intubation on 111 and extubation 11/04/2019  Antibiotics: . None currently  Anti-infectives (From admission, onward)   Start     Dose/Rate Route Frequency Ordered Stop  11/01/19 0900  cefTRIAXone (ROCEPHIN) 2 g in sodium chloride 0.9 % 100 mL IVPB     2 g 200 mL/hr over 30 Minutes Intravenous Every 24 hours 11/01/19 0855 11/05/19 2006   10/26/19 0000  ceFAZolin (ANCEF) IVPB 1 g/50 mL  premix     1 g 100 mL/hr over 30 Minutes Intravenous Every 8 hours 10/25/19 2005 10/26/19 1754   10/25/19 1600  bacitracin 50,000 Units in sodium chloride 0.9 % 500 mL irrigation  Status:  Discontinued       As needed 10/25/19 1600 10/25/19 1825   10/25/19 0800  ceFAZolin (ANCEF) IVPB 2g/100 mL premix  Status:  Discontinued     2 g 200 mL/hr over 30 Minutes Intravenous 30 min pre-op 10/25/19 0459 10/25/19 2000     Subjective: Today, patient denies any interval complains.  Patient overt pain, nausea, vomiting or headache.  Objective: Vitals:   11/15/19 0334 11/15/19 0744  BP: (!) 137/109 (!) 116/97  Pulse: 88 96  Resp: 20 18  Temp: 97.7 F (36.5 C) 97.9 F (36.6 C)  SpO2:  96%    Intake/Output Summary (Last 24 hours) at 11/15/2019 1050 Last data filed at 11/15/2019 0954 Gross per 24 hour  Intake --  Output 1200 ml  Net -1200 ml   Filed Weights   11/13/19 0500 11/14/19 0448 11/15/19 0449  Weight: 119.7 kg 119.7 kg 119.3 kg   Body mass index is 45.14 kg/m.   Physical Exam: GENERAL: Patient is alert awake and oriented.. Not in obvious distress.  Morbidly obese HENT: No scleral pallor or icterus. Pupils equally reactive to light. Oral mucosa is moist NECK: is supple,  CHEST: Clear to auscultation. No crackles or wheezes.  Diminished breath sounds bilaterally. CVS: S1 and S2 heard, no murmur. Regular rate and rhythm.  ABDOMEN: Soft, non-tender, bowel sounds are present. EXTREMITIES: No edema.   CNS: Alert, awake and communicative.  Mild left eyedroop.  Moving all extremities. SKIN: warm and dry without rashes.  Data Review: I have personally reviewed the following laboratory data and studies,  CBC: Recent Labs  Lab 11/15/19 0206  WBC 11.4*  HGB 8.7*  HCT 27.7*  MCV 84.2  PLT 0000000   Basic Metabolic Panel: Recent Labs  Lab 11/10/19 0255 11/11/19 0528 11/12/19 0256 11/13/19 0254 11/15/19 0206  NA 156* 153* 145 143 138  K 3.4* 3.7 3.5 3.9 4.0  CL 119* 117*  109 108 108  CO2 25 24 24 23  21*  GLUCOSE 144* 169* 180* 168* 199*  BUN 35* 30* 24* 21 15  CREATININE 1.39* 1.24 1.24 1.30* 1.05  CALCIUM 8.8* 8.6* 8.4* 8.0* 8.2*  MG  --   --   --   --  1.4*   Liver Function Tests: No results for input(s): AST, ALT, ALKPHOS, BILITOT, PROT, ALBUMIN in the last 168 hours. No results for input(s): LIPASE, AMYLASE in the last 168 hours. No results for input(s): AMMONIA in the last 168 hours. Cardiac Enzymes: No results for input(s): CKTOTAL, CKMB, CKMBINDEX, TROPONINI in the last 168 hours. BNP (last 3 results) No results for input(s): BNP in the last 8760 hours.  ProBNP (last 3 results) No results for input(s): PROBNP in the last 8760 hours.  CBG: Recent Labs  Lab 11/14/19 0425 11/14/19 0735 11/14/19 1600 11/14/19 2119 11/15/19 0624  GLUCAP 150* 111* 203* 157* 161*   No results found for this or any previous visit (from the past 240 hour(s)).   Studies: No results found.  Flora Lipps, MD  [facility] 11/15/2019

## 2019-11-15 NOTE — Progress Notes (Signed)
Nutrition Follow-up  DOCUMENTATION CODES:   Morbid obesity  INTERVENTION:  Provide Ensure Enlive po once daily (thickened to appropriate consistency), each supplement provides 350 kcal and 20 grams of protein  Encourage adequate PO intake.   NUTRITION DIAGNOSIS:   Inadequate oral intake related to lethargy/confusion as evidenced by other (comment)(per RN report); improved  GOAL:   Patient will meet greater than or equal to 90% of their needs; progressed  MONITOR:   PO intake, Supplement acceptance, Labs, Weight trends, I & O's  REASON FOR ASSESSMENT:   Consult Enteral/tube feeding initiation and management  ASSESSMENT:   Pt with a PMH significant for DM, HTN, HLD, CVA. Pt suffered blunt head trauma several weeks ago and subsequently developed confusion, aphasia, and mobility problems to the point of inability to perform ADLs. Pt found to have loculated AoC left SDH with mass effect requiring craniotomy and evacuation of SDH.  1/7 craniotomy and evacuation of SDH, extubated in PACU, transferred to ICU 1/11 intubated 1/15 cortrak placed, tip gastric  1/17 extubated  1/20 Cortrak removed    Pt is currently on a dysphagia 2 diet with nectar thick liquids. Meal completion has been 50-100% with most intake 75-100%. Pt has been tolerating his diet. RD to order nutritional supplement to aid in adequate nutrition. Plans for [facility] upon discharge. Labs and medications reviewed.   Diet Order:   Diet Order            DIET DYS 2 Room service appropriate? No; Fluid consistency: Nectar Thick  Diet effective now              EDUCATION NEEDS:   Not appropriate for education at this time  Skin:  Skin Assessment: Reviewed RN Assessment  Last BM:  1/28  Height:   Ht Readings from Last 1 Encounters:  10/25/19 5\' 4"  (1.626 m)    Weight:   Wt Readings from Last 1 Encounters:  11/15/19 119.3 kg    Ideal Body Weight:  59.09 kg  BMI:  Body mass index is 45.14  kg/m.  Estimated Nutritional Needs:   Kcal:  2000-2200  Protein:  100-125 grams  Fluid:  2 L/day    Corrin Parker, MS, RD, LDN Pager # [phone] After hours/ weekend pager # [phone]

## 2019-11-16 ENCOUNTER — Inpatient Hospital Stay (HOSPITAL_COMMUNITY): Payer: Medicare HMO

## 2019-11-16 DIAGNOSIS — N182 Chronic kidney disease, stage 2 (mild): Secondary | ICD-10-CM | POA: Diagnosis not present

## 2019-11-16 DIAGNOSIS — E876 Hypokalemia: Secondary | ICD-10-CM | POA: Diagnosis not present

## 2019-11-16 DIAGNOSIS — I959 Hypotension, unspecified: Secondary | ICD-10-CM | POA: Diagnosis not present

## 2019-11-16 DIAGNOSIS — E1142 Type 2 diabetes mellitus with diabetic polyneuropathy: Secondary | ICD-10-CM | POA: Diagnosis not present

## 2019-11-16 DIAGNOSIS — R0600 Dyspnea, unspecified: Secondary | ICD-10-CM | POA: Diagnosis not present

## 2019-11-16 DIAGNOSIS — Z794 Long term (current) use of insulin: Secondary | ICD-10-CM | POA: Diagnosis not present

## 2019-11-16 DIAGNOSIS — R1310 Dysphagia, unspecified: Secondary | ICD-10-CM | POA: Diagnosis not present

## 2019-11-16 DIAGNOSIS — R279 Unspecified lack of coordination: Secondary | ICD-10-CM | POA: Diagnosis not present

## 2019-11-16 DIAGNOSIS — E785 Hyperlipidemia, unspecified: Secondary | ICD-10-CM | POA: Diagnosis not present

## 2019-11-16 DIAGNOSIS — N189 Chronic kidney disease, unspecified: Secondary | ICD-10-CM | POA: Diagnosis not present

## 2019-11-16 DIAGNOSIS — I129 Hypertensive chronic kidney disease with stage 1 through stage 4 chronic kidney disease, or unspecified chronic kidney disease: Secondary | ICD-10-CM | POA: Diagnosis not present

## 2019-11-16 DIAGNOSIS — S065X9A Traumatic subdural hemorrhage with loss of consciousness of unspecified duration, initial encounter: Secondary | ICD-10-CM | POA: Diagnosis not present

## 2019-11-16 DIAGNOSIS — I10 Essential (primary) hypertension: Secondary | ICD-10-CM | POA: Diagnosis not present

## 2019-11-16 DIAGNOSIS — N179 Acute kidney failure, unspecified: Secondary | ICD-10-CM | POA: Diagnosis not present

## 2019-11-16 DIAGNOSIS — E870 Hyperosmolality and hypernatremia: Secondary | ICD-10-CM | POA: Diagnosis not present

## 2019-11-16 DIAGNOSIS — E119 Type 2 diabetes mellitus without complications: Secondary | ICD-10-CM | POA: Diagnosis not present

## 2019-11-16 DIAGNOSIS — E1121 Type 2 diabetes mellitus with diabetic nephropathy: Secondary | ICD-10-CM | POA: Diagnosis not present

## 2019-11-16 DIAGNOSIS — R410 Disorientation, unspecified: Secondary | ICD-10-CM | POA: Diagnosis not present

## 2019-11-16 DIAGNOSIS — J9601 Acute respiratory failure with hypoxia: Secondary | ICD-10-CM | POA: Diagnosis not present

## 2019-11-16 DIAGNOSIS — J9600 Acute respiratory failure, unspecified whether with hypoxia or hypercapnia: Secondary | ICD-10-CM | POA: Diagnosis not present

## 2019-11-16 DIAGNOSIS — S065X0D Traumatic subdural hemorrhage without loss of consciousness, subsequent encounter: Secondary | ICD-10-CM | POA: Diagnosis not present

## 2019-11-16 DIAGNOSIS — G9340 Encephalopathy, unspecified: Secondary | ICD-10-CM | POA: Diagnosis not present

## 2019-11-16 DIAGNOSIS — Z743 Need for continuous supervision: Secondary | ICD-10-CM | POA: Diagnosis not present

## 2019-11-16 LAB — GLUCOSE, CAPILLARY
Glucose-Capillary: 91 mg/dL (ref 70–99)
Glucose-Capillary: 176 mg/dL — ABNORMAL HIGH (ref 70–99)

## 2019-11-16 MED ORDER — QUETIAPINE FUMARATE 25 MG PO TABS: 25.0000 mg | ORAL_TABLET | Freq: Every evening | ORAL | Status: DC

## 2019-11-16 MED ORDER — THIAMINE HCL 100 MG PO TABS: 100.0000 mg | ORAL_TABLET | Freq: Every day | ORAL | Status: DC

## 2019-11-16 MED ORDER — DOCUSATE SODIUM 100 MG PO CAPS: 100.0000 mg | ORAL_CAPSULE | Freq: Every day | ORAL | Status: DC | PRN

## 2019-11-16 MED ORDER — POTASSIUM CHLORIDE ER 10 MEQ PO TBCR: 10.0000 meq | EXTENDED_RELEASE_TABLET | Freq: Every day | ORAL | 0 refills | Status: DC

## 2019-11-16 MED ORDER — MAGNESIUM OXIDE 400 (241.3 MG) MG PO TABS: 400.0000 mg | ORAL_TABLET | Freq: Two times a day (BID) | ORAL | Status: AC

## 2019-11-16 MED ORDER — INSULIN ASPART 100 UNIT/ML ~~LOC~~ SOLN: 0.0000 [IU] | Freq: Three times a day (TID) | SUBCUTANEOUS | Status: DC

## 2019-11-16 NOTE — Progress Notes (Signed)
IV removed without complication. Discharge summary given to PTAR to take to [place] with paient. All personal belongings collected and sent with wife. Report called to [facility] and given Truman Hayward, LPN.

## 2019-11-16 NOTE — Discharge Summary (Signed)
Physician Discharge Summary  Chauncey Ahlgren Comer [id number] DOB: [birth year] DOA: 10/24/2019  PCP: [name], DO  Admit date: 10/24/2019 Discharge date: 11/16/2019  Admitted From: Home  Discharge disposition: SNF   Recommendations for Outpatient Follow-Up:    Follow up with your primary care provider at the skilled nursing facility in 3 to 5 days.  Please check CBC, BMP, magnesium in 3 to 5 days.  Patient is currently on dysphagia 2 diet.  Advance as tolerated/assessment by speech therapy.  Patient was started on Seroquel during hospitalization for delirium.  Could consider discontinuation  Check chest x-ray in 3 to 4 weeks.   Discharge Diagnosis:   Principal Problem:   SDH (subdural hematoma) (HCC) Active Problems:   Respiratory failure (HCC)   Acute encephalopathy   Hypernatremia   Dyspnea   Acute respiratory failure with hypoxemia (HCC)   Acute kidney injury superimposed on chronic kidney disease ([place])   Discharge Condition: Improved.  Diet recommendation:   Carbohydrate-modified.  Regular.  Code status: Full.   History of Present Illness:   74 year old male with past medical history of type 2 diabetes presented to the hospital with confusion, difficulty ambulating.  He did have history of blunt trauma to the head several weeks ago prior to presentation.  Patient presented to [facility]  on 10/24/2019 and CT head showed loculated left sided SDH with mass-effect. He was transferred to [facility] for neurosurgical evaluation. On 10/25/2019 he was taken to the OR for craniotomy and evacuation of SDH. PCCM was consulted for vent management postop however patient was extubated in PACU prior to transfer to ICU. Patient had increased respiratory distress and altered mental status on 10/27/2019 and PCCM was reconsulted. Patient completed 5 day course of Rocephin for H. influenzae pneumonia and PCCM signed off on 11/05/2019. No further neurosurgical interventions were  planned.   Hospital Course:   Following conditions were addressed during hospitalization as listed below,  Acute metabolic encephalopathy with hyperactive delirium.  Improved.  Currently alert awake oriented.  Patient required mittens during hospitalization and has been off mittens for at least 2 days. Did well on  Seroquel.  Ammonia level was 41.  Patient was also started on thiamine.  Sodium levels have improved as well.  Subdural hematoma s/p evacuation via craniotomy-improved.  Neurosurgeryhas signed off,patient was on IV Keppra from January 7 to January 17th,Keppra was discontinued per neurosurgery.    Acute respiratory failure with hypoxemia-with pneumonia.   Resolved.  Required intubation , he was extubated on 11/04/2019.   Currently on room air.  Has completed antibiotic course.  H influenzae pneumonia.  Completed 5-day course of Rocephin for pneumonia.  On room air.  Improved.  Dysphagia- has been seen by speech therapy and currently on dysphagia 2 diet.  Will need speech evaluation after discharge to assess for progress.  Acute kidney injury on CKD stage II-resolved.  Latest creatinine level 1.05  Hypernatremia-received D5 half-normal saline.  Resolved.  Latest sodium of 138.  Diabetes mellitus type 2- Currently on dysphagia 2 diet.  As per home medication, patient was on Tresiba 60 units and Metformin.  Have added sliding scale insulin on discharge as well.  Hypokalemia-improved.    Will continue potassium for few days on discharge.  Hypomagnesemia of 1.4 on 11/15/2019.  Received magnesium sulfate 2 g IV and p.o. magnesium oxide.  Continue magnesium oxide on discharge.  Please check a CBC BMP magnesium in 3 to 5 days.  Hypertension.   On Coreg, lisinopril and  HCTZ.  Closely monitor blood pressure.  Latest blood pressure 136/76.   Disposition.  At this time, patient is stable for disposition to skilled nursing facility.  Medical Consultants:     Neurosurgery  Procedures:     10/25/2019  craniotomy and evacuation of SDH by neurosurgery  Intubation on 111 and extubation 11/04/2019  Subjective:   Today, patient feels well.  Denies any headache, nausea, vomiting shortness of breath cough or fever.  Discharge Exam:   Vitals:   11/15/19 2342 11/16/19 0700  BP: 117/67 136/76  Pulse: 69 75  Resp: 19 18  Temp: 98.5 F (36.9 C) 98.5 F (36.9 C)  SpO2: 96% 98%   Vitals:   11/15/19 1838 11/15/19 2013 11/15/19 2342 11/16/19 0700  BP: (!) 117/59 (!) 110/55 117/67 136/76  Pulse: 83 72 69 75  Resp: 20 17 19 18   Temp:  97.9 F (36.6 C) 98.5 F (36.9 C) 98.5 F (36.9 C)  TempSrc:  Axillary Axillary Axillary  SpO2: 97% 97% 96% 98%  Weight:      Height:        General: Alert awake, not in obvious distress HENT: pupils equally reacting to light,  No scleral pallor or icterus noted. Oral mucosa is moist.  Chest:  Clear breath sounds.  Diminished breath sounds bilaterally. No crackles or wheezes.  CVS: S1 &S2 heard. No murmur.  Regular rate and rhythm. Abdomen: Soft, nontender, nondistended.  Bowel sounds are heard.   Extremities: No cyanosis, clubbing or edema.  Peripheral pulses are palpable. Psych: Alert, awake and oriented, normal mood CNS: Mild left eye droop.  Power equal in all extremities.   Skin: Warm and dry.  No rashes noted.  The results of significant diagnostics from this hospitalization (including imaging, microbiology, ancillary and laboratory) are listed below for reference.     Diagnostic Studies:   CT Head Wo Contrast  Addendum Date: 10/24/2019   ADDENDUM REPORT: 10/24/2019 16:40 ADDENDUM: Results were discussed with Dr. Malvin Johns at 4:36 p.m. [place] on October 24, 2019. Electronically Signed   By: Virgina Norfolk M.D.   On: 10/24/2019 16:40   Result Date: 10/24/2019 CLINICAL DATA:  Bilateral arm and leg weakness. EXAM: CT HEAD WITHOUT CONTRAST TECHNIQUE: Contiguous axial images were obtained  from the base of the skull through the vertex without intravenous contrast. COMPARISON:  December 12, 2015 FINDINGS: Brain: A large, acute on subacute/chronic, predominantly left frontal lobe subdural collection is seen. This area measures approximately 2.3 cm in maximum thickness and extends along the vertex. Mild involvement of the left parietal and left occipital regions is noted. Mass effect is seen on the adjacent sulci with diffuse sulcal effacement noted throughout the left hemisphere. There is approximately 5.53 mm left to right midline shift. There is mild cerebral atrophy with widening of the extra-axial spaces and ventricular dilatation. There are areas of decreased attenuation within the white matter tracts of the supratentorial brain, consistent with microvascular disease changes. Vascular: No hyperdense vessel or unexpected calcification. Skull: Normal. Negative for fracture or focal lesion. Sinuses/Orbits: No acute finding. Other: None. IMPRESSION: 1. Large, acute on subacute/chronic, predominantly left frontal lobe subdural collection, measuring up to 2.3 cm in maximum thickness. MRI correlation is recommended 2. Mass effect is seen on the adjacent sulci with approximately 5.53 mm left to right midline shift. 3. Chronic microvascular disease changes of the supratentorial brain. 4. Mild cerebral atrophy with widening of the extra-axial spaces and ventricular dilatation. Electronically Signed: By: Virgina Norfolk M.D. On:  10/24/2019 16:33   CT Cervical Spine Wo Contrast  Result Date: 10/24/2019 CLINICAL DATA:  Leg weakness for 2 weeks, known subdural hematoma EXAM: CT CERVICAL SPINE WITHOUT CONTRAST TECHNIQUE: Multidetector CT imaging of the cervical spine was performed without intravenous contrast. Multiplanar CT image reconstructions were also generated. COMPARISON:  None. FINDINGS: Alignment: Within normal limits. Skull base and vertebrae: 7 cervical segments are well visualized. Vertebral body  height is well maintained. Facet hypertrophic changes are noted. No acute fracture or acute facet abnormality is noted. Osteophytic changes are noted throughout the cervical spine. Soft tissues and spinal canal: Surrounding soft tissue structures are within normal limits. No significant disc pathology is seen. Upper chest: Negative. Other: None. IMPRESSION: Multilevel degenerative change without acute abnormality. Electronically Signed   By: Inez Catalina M.D.   On: 10/24/2019 17:31   DG CHEST PORT 1 VIEW  Result Date: 10/25/2019 CLINICAL DATA:  Stroke, diabetes EXAM: PORTABLE CHEST 1 VIEW COMPARISON:  Radiograph Feb 18, 2006 FINDINGS: Lung volumes are low. Diffuse patchy interstitial and airspace opacities throughout the lungs with some cephalization and indistinctness of the pulmonary vascularity. Cardiac silhouette is enlarged though may be accentuated by the portable technique. No acute osseous or soft tissue abnormality. Degenerative changes are present in the imaged spine and shoulders. IMPRESSION: Diffuse patchy interstitial and airspace opacities throughout the lungs with cephalization and indistinctness of the pulmonary vascularity. Findings are compatible with pulmonary edema. Superimposed pneumonia cannot be excluded. Likely mild cardiomegaly. Electronically Signed   By: Lovena Le M.D.   On: 10/25/2019 06:39   DG Knee Complete 4 Views Left  Result Date: 10/24/2019 CLINICAL DATA:  Pain status post fall EXAM: LEFT KNEE - COMPLETE 4+ VIEW COMPARISON:  None. FINDINGS: There are tricompartmental degenerative changes. There is no displaced fracture. No dislocation. No significant joint effusion. IMPRESSION: Tricompartmental degenerative changes without acute bony abnormality. No significant joint effusion. Electronically Signed   By: Constance Holster M.D.   On: 10/24/2019 18:43   DG Hip Unilat W or Wo Pelvis 2-3 Views Left  Result Date: 10/24/2019 CLINICAL DATA:  Fall EXAM: DG HIP (WITH OR WITHOUT  PELVIS) 2-3V LEFT COMPARISON:  None. FINDINGS: There is no acute displaced fracture or dislocation. There are advanced degenerative changes of both hips, left worse than right. Phleboliths project over the patient's pelvis. Vascular calcifications are noted. IMPRESSION: Advanced degenerative changes of both hips, left worse than right. No acute displaced fracture or dislocation. Electronically Signed   By: Constance Holster M.D.   On: 10/24/2019 18:42     Labs:   Basic Metabolic Panel: Recent Labs  Lab 11/10/19 0255 11/10/19 0255 11/11/19 0528 11/11/19 0528 11/12/19 0256 11/12/19 0256 11/13/19 0254 11/15/19 0206  NA 156*  --  153*  --  145  --  143 138  K 3.4*   < > 3.7   < > 3.5   < > 3.9 4.0  CL 119*  --  117*  --  109  --  108 108  CO2 25  --  24  --  24  --  23 21*  GLUCOSE 144*  --  169*  --  180*  --  168* 199*  BUN 35*  --  30*  --  24*  --  21 15  CREATININE 1.39*  --  1.24  --  1.24  --  1.30* 1.05  CALCIUM 8.8*  --  8.6*  --  8.4*  --  8.0* 8.2*  MG  --   --   --   --   --   --   --  1.4*   < > = values in this interval not displayed.   GFR Estimated Creatinine Clearance: 71.5 mL/min (by C-G formula based on SCr of 1.05 mg/dL). Liver Function Tests: No results for input(s): AST, ALT, ALKPHOS, BILITOT, PROT, ALBUMIN in the last 168 hours. No results for input(s): LIPASE, AMYLASE in the last 168 hours. No results for input(s): AMMONIA in the last 168 hours. Coagulation profile No results for input(s): INR, PROTIME in the last 168 hours.  CBC: Recent Labs  Lab 11/15/19 0206  WBC 11.4*  HGB 8.7*  HCT 27.7*  MCV 84.2  PLT 243   Cardiac Enzymes: No results for input(s): CKTOTAL, CKMB, CKMBINDEX, TROPONINI in the last 168 hours. BNP: Invalid input(s): POCBNP CBG: Recent Labs  Lab 11/15/19 1214 11/15/19 1640 11/15/19 2149 11/15/19 2341 11/16/19 0503  GLUCAP 195* 212* 159* 139* 91   D-Dimer No results for input(s): DDIMER in the last 72 hours. Hgb  A1c No results for input(s): HGBA1C in the last 72 hours. Lipid Profile No results for input(s): CHOL, HDL, LDLCALC, TRIG, CHOLHDL, LDLDIRECT in the last 72 hours. Thyroid function studies No results for input(s): TSH, T4TOTAL, T3FREE, THYROIDAB in the last 72 hours.  Invalid input(s): FREET3 Anemia work up No results for input(s): VITAMINB12, FOLATE, FERRITIN, TIBC, IRON, RETICCTPCT in the last 72 hours. Microbiology Recent Results (from the past 240 hour(s))  SARS CORONAVIRUS 2 (TAT 6-24 HRS) Nasopharyngeal Nasopharyngeal Swab     Status: None   Collection Time: 11/15/19 10:50 AM   Specimen: Nasopharyngeal Swab  Result Value Ref Range Status   SARS Coronavirus 2 NEGATIVE NEGATIVE Final    Comment: (NOTE) SARS-CoV-2 target nucleic acids are NOT DETECTED. The SARS-CoV-2 RNA is generally detectable in upper and lower respiratory specimens during the acute phase of infection. Negative results do not preclude SARS-CoV-2 infection, do not rule out co-infections with other pathogens, and should not be used as the sole basis for treatment or other patient management decisions. Negative results must be combined with clinical observations, patient history, and epidemiological information. The expected result is Negative. Fact Sheet for Patients: SugarRoll.be Fact Sheet for Healthcare Providers: https://www.woods-mathews.com/ This test is not yet approved or cleared by the [place] FDA and  has been authorized for detection and/or diagnosis of SARS-CoV-2 by FDA under an Emergency Use Authorization (EUA). This EUA will remain  in effect (meaning this test can be used) for the duration of the COVID-19 declaration under Section 56 4(b)(1) of the Act, 21 U.S.C. section 360bbb-3(b)(1), unless the authorization is terminated or revoked sooner. Performed at [facility], [place] [address], [place],  [address]      Discharge  Instructions:   Discharge Instructions    Diet Carb Modified   Complete by: As directed    Dysphagia II diet- advance as tolerated   Discharge instructions   Complete by: As directed    Follow up with your primary care provider at the skilled facility in 3-5 days.   Increase activity slowly   Complete by: As directed    As per physical therapy     Allergies as of 11/16/2019      Reactions   Influenza Vaccines Other (See Comments)   Aspirin    Other reaction(s): Other (See Comments) Gastroesophageal reflux Unspecified    Atorvastatin Other (See Comments)   Myalgias GI upset Unspecified    Canagliflozin    Other reaction(s): Other (See Comments) Pt reports that the use of Invokana caused acute kidney  failure  Unspecified    Statins    Other reaction(s): Myalgias (intolerance)      Medication List    TAKE these medications   carvedilol 12.5 MG tablet Commonly known as: COREG TAKE 1 TABLET TWICE DAILY  WITH MEALS   docusate sodium 100 MG capsule Commonly known as: Colace Take 1 capsule (100 mg total) by mouth daily as needed.   fenofibrate micronized 200 MG capsule Commonly known as: LOFIBRA TAKE 1 CAPSULE DAILY BEFOREBREAKFAST What changed: See the new instructions.   gabapentin 600 MG tablet Commonly known as: NEURONTIN TAKE 1 TABLET 3 TIMES A DAY   hydrochlorothiazide 25 MG tablet Commonly known as: HYDRODIURIL TAKE 1 TABLET DAILY   insulin aspart 100 UNIT/ML injection Commonly known as: novoLOG Inject 0-20 Units into the skin 4 (four) times daily -  before meals and at bedtime. What changed:   medication strength  how much to take  when to take this  reasons to take this  additional instructions   insulin degludec 100 UNIT/ML Sopn FlexTouch Pen Commonly known as: TRESIBA Inject 0.6 mLs (60 Units total) into the skin at bedtime.   lisinopril 40 MG tablet Commonly known as: ZESTRIL TAKE 1 TABLET DAILY   lovastatin 10 MG tablet Commonly  known as: MEVACOR Take 1 tablet (10 mg total) by mouth at bedtime.   magnesium oxide 400 (241.3 Mg) MG tablet Commonly known as: MAG-OX Take 1 tablet (400 mg total) by mouth 2 (two) times daily for 10 days.   metFORMIN 500 MG tablet Commonly known as: GLUCOPHAGE Take 500 mg by mouth 2 (two) times daily with a meal.   multivitamin with minerals tablet Take 1 tablet by mouth daily.   potassium chloride 10 MEQ tablet Commonly known as: KLOR-CON Take 1 tablet (10 mEq total) by mouth daily for 10 days.   QUEtiapine 25 MG tablet Commonly known as: SEROQUEL Take 1 tablet (25 mg total) by mouth every evening.   thiamine 100 MG tablet Take 1 tablet (100 mg total) by mouth daily.   vitamin B-12 1000 MCG tablet Commonly known as: CYANOCOBALAMIN Take 1,000 mcg by mouth daily.      Contact information for after-discharge care    Destination    [facility] [place] [facility] .   Service: Skilled Nursing Contact information: [address] [place] [phone]               Time coordinating discharge: 39 minutes  Signed:  Araina Butrick  [facility] 11/16/2019, 10:17 AM

## 2019-11-16 NOTE — TOC Progression Note (Signed)
Transition of Care [facility]) - Progression Note    Patient Details  Name: Tommy Green MRN: [id number] Date of Birth: February 06, 1946  Transition of Care [facility]) CM/SW [place], [place] Phone Number: 11/16/2019, 9:45 AM  Clinical Narrative:    Bed available at  [facility] today. Admissions liaison will be in office after 11 and will assist with admissions at that time. Dr. Louanne Belton has been contacted that facility is able to admit pt today. COVID swab from yesterday has resulted negative.    Expected Discharge Plan: [place] Barriers to Discharge: Continued Medical Work up  Expected Discharge Plan and Services Expected Discharge Plan: [place] arrangements for the past 2 months: Single Family Home  Readmission Risk Interventions No flowsheet data found.

## 2019-11-16 NOTE — TOC Transition Note (Signed)
Transition of Care [facility]) - CM/SW Discharge Note   Patient Details  Name: Tommy Green MRN: [id number] Date of Birth: [birth year]  Transition [facility]) CM/SW Contact:  Geralynn Ochs, LCSW Phone Number: 11/16/2019, 11:55 AM   Clinical Narrative:  Nurse to call report to [phone], Room 7011.     Final next level of care: Skilled Nursing Facility Barriers to Discharge: Barriers Resolved   Patient Goals and CMS Choice Patient states their goals for this hospitalization and ongoing recovery are:: Patient unable to participate in goal setting due to disorientation. CMS Medicare.gov Compare Post Acute Care list provided to:: Patient Represenative (must comment)(Spouse) Choice offered to / list presented to : Spouse  Discharge Placement              Patient [facility] bed at: [facility] Patient to be transferred to facility by: [place] Name of family member notified: Wife Patient and family notified of of transfer: 11/16/19  Discharge Plan and Services                                     Social Determinants of Health (SDOH) Interventions     Readmission Risk Interventions No flowsheet data found.

## 2019-11-16 NOTE — Progress Notes (Signed)
Physical Therapy Treatment Patient Details Name: Tommy Green MRN: [id number] DOB: Feb 03, 1946 Today's Date: 11/16/2019    History of Present Illness 74 yo male admitted with slurred speech and weakness and recent falls. chronic L SDH with mass effect (had blunt force trauma a few weeks PTA); craniotomy 10/25/19, intubated 1/11-extubated 11/04/19 PMH CVA DM with significant bil LE neuropathy HLD HTN CKD    PT Comments    Pt performed seated and supine exercises and progressed ambulation this am.  Wife present he remains confused but has moments of clarity.  Plan for d/c to snf this pm.     Follow Up Recommendations  SNF     Equipment Recommendations  Other (comment)(TBD at next venue)    Recommendations for Other Services OT consult     Precautions / Restrictions Precautions Precautions: Fall Restrictions Weight Bearing Restrictions: No    Mobility  Bed Mobility               General bed mobility comments: Pt seated in recliner on arrival.  Transfers Overall transfer level: Needs assistance Equipment used: Rolling walker (2 wheeled) Transfers: Sit to/from Stand Sit to Stand: Mod assist         General transfer comment: Cues for sequencing and hand placement to and from seated surface.  Rocking momentum to achieve standing.  Ambulation/Gait Ambulation/Gait assistance: Mod assist;+2 safety/equipment(for close chair follow.) Gait Distance (Feet): 40 Feet Assistive device: Rolling walker (2 wheeled) Gait Pattern/deviations: Trunk flexed;Wide base of support;Shuffle;Step-through pattern Gait velocity: decreased   General Gait Details: Cues for upper trunk control and assistance to maintain correct pathway of RW.  Very unsteady with external assistance to maintain balance with close chair follow.   Stairs             Wheelchair Mobility    Modified Rankin (Stroke Patients Only)       Balance Overall balance assessment: Needs assistance    Sitting balance-Leahy Scale: Fair       Standing balance-Leahy Scale: Poor                              Cognition Arousal/Alertness: Awake/alert Behavior During Therapy: WFL for tasks assessed/performed Overall Cognitive Status: Within Functional Limits for tasks assessed(not formally assessed within functional limits for tasks performed.)                                 General Comments: Pt more alert and oriented with wife present.  He was able to voice he passed the test referring to mbs.      Exercises General Exercises - Lower Extremity Ankle Circles/Pumps: AROM;Both;20 reps;Seated Long Arc Quad: AROM;20 reps;Both;Seated Hip ABduction/ADduction: AROM;Both;10 reps;Supine Hip Flexion/Marching: AROM;20 reps;Both;Seated    General Comments        Pertinent Vitals/Pain Pain Assessment: Faces Faces Pain Scale: No hurt    Home Living                      Prior Function            PT Goals (current goals can now be found in the care plan section) Acute Rehab PT Goals Patient Stated Goal: pt did not state Potential to Achieve Goals: Fair Progress towards PT goals: Progressing toward goals    Frequency    Min 3X/week      PT Plan Current plan  remains appropriate    Co-evaluation              AM-PAC PT "6 Clicks" Mobility   Outcome Measure  Help needed turning from your back to your side while in a flat bed without using bedrails?: A Lot Help needed moving from lying on your back to sitting on the side of a flat bed without using bedrails?: A Lot Help needed moving to and from a bed to a chair (including a wheelchair)?: A Lot Help needed standing up from a chair using your arms (e.g., wheelchair or bedside chair)?: A Lot Help needed to walk in hospital room?: A Lot Help needed climbing 3-5 steps with a railing? : Total 6 Click Score: 11    End of Session Equipment Utilized During Treatment: Gait belt Activity  Tolerance: Patient tolerated treatment well Patient left: with call bell/phone within reach;in chair;with chair alarm set Nurse Communication: Mobility status PT Visit Diagnosis: Other abnormalities of gait and mobility (R26.89);Unsteadiness on feet (R26.81);Repeated falls (R29.6);Muscle weakness (generalized) (M62.81);Pain;Other symptoms and signs involving the nervous system (R29.898);Hemiplegia and hemiparesis Hemiplegia - Right/Left: Right Hemiplegia - dominant/non-dominant: Dominant Hemiplegia - caused by: Nontraumatic SAH     Time: [id number] PT Time Calculation (min) (ACUTE ONLY): 17 min  Charges:  $Gait Training: 8-22 mins                     Erasmo Leventhal , PTA Acute Rehabilitation Services Pager [phone] Office [phone]     Nestor Wieneke [name] 11/16/2019, 2:33 PM

## 2019-11-16 NOTE — Progress Notes (Signed)
Modified Barium Swallow Progress Note  Patient Details  Name: Tommy Green MRN: [id number] Date of Birth: 05-Oct-1946  Today's Date: 11/16/2019  Modified Barium Swallow completed.  Full report located under Chart Review in the Imaging Section.  Brief recommendations include the following:  Clinical Impression Patient presents with mild oropharyngeal dysphagia with thin liquids secondary to delayed oral transit, decreased bolus cohesion and reduced laryngeal closure. Oral phase remarkable for delayed AP transit and decreased bolus control. Pharyngeal phase was remarkable for reduced laryngeal closure and sensation, and impaired timing of bolus to the pyriform sinuses resulting in slient aspiration, occuring with thin liquids via straw and while consuming a pill with thin liquids. As pt consumed liquids via cup (and no pill), no aspiration occurred given increased oral control and improvement of timing. With pt's continued improvement of his cogntive status and upcoming dishcharge, regular diet with thin liquids recommended. Pt should not utilize a straw and consume meds whole in purees.   Swallow Evaluation Recommendations   SLP Diet Recommendations: Regular solids;Thin liquid   Liquid Administration via: No straw;Cup   Medication Administration: Whole meds with puree   Supervision: Patient able to self feed;Intermittent supervision to cue for compensatory strategies   Compensations: Minimize environmental distractions;Slow rate;Small sips/bites   Postural Changes: Remain semi-upright after after feeds/meals (Comment);Seated upright at 90 degrees   Oral Care Recommendations: Oral care BID       Aline August, Student SLP Office: [phone]  11/16/2019,11:45 AM

## 2019-11-16 NOTE — Progress Notes (Signed)
  Speech Language Pathology Treatment: Dysphagia  Patient Details Name: Tommy Green MRN: [id number] DOB: March 25, 1946 Today's Date: 11/16/2019 Time: [id number] SLP Time Calculation (min) (ACUTE ONLY): 12 min  Assessment / Plan / Recommendation Clinical Impression  Pt was seen for completion of EMST to address strength and breath support for swallowing. Pt independently recalled how many repetitions/sets to perform but needed Min cues for accuracy in completion - primarily for performing hard exhales as opposed to long, slow ones. Pt completed all five sets without overt signs of intolerance. NOte that pt may discharge to SNF this afternoon. Recommend repeating MBS prior to discharge - pt ion agreement. Scheduled for later this morning with radiology.    HPI HPI: Pt is a 74 y.o. male who has a PMH w prior CVA, diabetes, HLD, and HTN. He suffered blunt head trauma a few weeks back and had no concerning findings on imaging.  Subsequently developed confusion, aphasia, mobility problems to the point he could no longer perform any ADLs. He presented to [facility] ED 1/6 and CT head showed loculated AoC left SDH with mass effect. Pt is now post evacuation of SDH on 1/7.  ETT 1/7 for procedure and again 1/11-1/17. Swallow eval 1/8 recommended NPO due to mentation and moderate-severe oral deficits.      SLP Plan  MBS       Recommendations  Diet recommendations: Dysphagia 2 (fine chop);Nectar-thick liquid Liquids provided via: Cup Medication Administration: Crushed with puree Supervision: Full supervision/cueing for compensatory strategies Compensations: Minimize environmental distractions;Slow rate;Small sips/bites Postural Changes and/or Swallow Maneuvers: Seated upright 90 degrees;Upright 30-60 min after meal                Oral Care Recommendations: Oral care BID Follow up Recommendations: Skilled Nursing facility;24 hour supervision/assistance SLP Visit Diagnosis: Dysphagia, oropharyngeal  phase (R13.12) Plan: MBS       GO                 Osie Bond., M.A. [place] Acute Rehabilitation Services Pager [phone] Office [phone]  11/16/2019, 9:04 AM

## 2019-11-21 DIAGNOSIS — S065X9A Traumatic subdural hemorrhage with loss of consciousness of unspecified duration, initial encounter: Secondary | ICD-10-CM | POA: Diagnosis not present

## 2019-11-21 DIAGNOSIS — I10 Essential (primary) hypertension: Secondary | ICD-10-CM | POA: Diagnosis not present

## 2019-11-21 DIAGNOSIS — R1310 Dysphagia, unspecified: Secondary | ICD-10-CM | POA: Diagnosis not present

## 2019-11-21 DIAGNOSIS — R410 Disorientation, unspecified: Secondary | ICD-10-CM | POA: Diagnosis not present

## 2019-11-21 DIAGNOSIS — Z794 Long term (current) use of insulin: Secondary | ICD-10-CM | POA: Diagnosis not present

## 2019-11-21 DIAGNOSIS — E1142 Type 2 diabetes mellitus with diabetic polyneuropathy: Secondary | ICD-10-CM | POA: Diagnosis not present

## 2019-11-23 ENCOUNTER — Telehealth: Payer: Self-pay | Admitting: Family Medicine

## 2019-11-23 NOTE — Telephone Encounter (Signed)
Caller/Agency: Tommy Green w/[facility] Callback Number: [phone] Requesting: SN/PT/OT Frequency: EVAL & TREAT  Please call with VO

## 2019-11-26 ENCOUNTER — Telehealth: Payer: Self-pay | Admitting: Family Medicine

## 2019-11-26 DIAGNOSIS — D631 Anemia in chronic kidney disease: Secondary | ICD-10-CM | POA: Diagnosis not present

## 2019-11-26 DIAGNOSIS — E1140 Type 2 diabetes mellitus with diabetic neuropathy, unspecified: Secondary | ICD-10-CM | POA: Diagnosis not present

## 2019-11-26 DIAGNOSIS — G9341 Metabolic encephalopathy: Secondary | ICD-10-CM | POA: Diagnosis not present

## 2019-11-26 DIAGNOSIS — R1310 Dysphagia, unspecified: Secondary | ICD-10-CM | POA: Diagnosis not present

## 2019-11-26 DIAGNOSIS — N182 Chronic kidney disease, stage 2 (mild): Secondary | ICD-10-CM | POA: Diagnosis not present

## 2019-11-26 DIAGNOSIS — E876 Hypokalemia: Secondary | ICD-10-CM | POA: Diagnosis not present

## 2019-11-26 DIAGNOSIS — I129 Hypertensive chronic kidney disease with stage 1 through stage 4 chronic kidney disease, or unspecified chronic kidney disease: Secondary | ICD-10-CM | POA: Diagnosis not present

## 2019-11-26 DIAGNOSIS — S065X0D Traumatic subdural hemorrhage without loss of consciousness, subsequent encounter: Secondary | ICD-10-CM | POA: Diagnosis not present

## 2019-11-26 DIAGNOSIS — E1122 Type 2 diabetes mellitus with diabetic chronic kidney disease: Secondary | ICD-10-CM | POA: Diagnosis not present

## 2019-11-26 NOTE — Telephone Encounter (Signed)
I have talked to them several times today and they took a Verbal and will fax over orders, but as of today I have seen none.

## 2019-11-26 NOTE — Telephone Encounter (Signed)
Per Mickel Baas at [facility] states that she needs approval for plan of care for patient, states that its for nursing

## 2019-11-26 NOTE — Telephone Encounter (Signed)
Is there paperwork? I didn't see anything as of Friday. If it needs to be done before next week, have another provider sign off on it please. Any of the providers are free to contact me if they have questions about this patient. Ty.

## 2019-11-26 NOTE — Telephone Encounter (Signed)
Called [facility] informed of PCP verbal ok 

## 2019-11-26 NOTE — Telephone Encounter (Signed)
Called gave per PCP ok VO

## 2019-11-29 ENCOUNTER — Telehealth: Payer: Self-pay | Admitting: Family Medicine

## 2019-11-29 DIAGNOSIS — E1140 Type 2 diabetes mellitus with diabetic neuropathy, unspecified: Secondary | ICD-10-CM | POA: Diagnosis not present

## 2019-11-29 DIAGNOSIS — S065X0D Traumatic subdural hemorrhage without loss of consciousness, subsequent encounter: Secondary | ICD-10-CM | POA: Diagnosis not present

## 2019-11-29 DIAGNOSIS — R69 Illness, unspecified: Secondary | ICD-10-CM | POA: Diagnosis not present

## 2019-11-29 DIAGNOSIS — R1310 Dysphagia, unspecified: Secondary | ICD-10-CM | POA: Diagnosis not present

## 2019-11-29 DIAGNOSIS — E876 Hypokalemia: Secondary | ICD-10-CM | POA: Diagnosis not present

## 2019-11-29 DIAGNOSIS — D631 Anemia in chronic kidney disease: Secondary | ICD-10-CM | POA: Diagnosis not present

## 2019-11-29 DIAGNOSIS — I129 Hypertensive chronic kidney disease with stage 1 through stage 4 chronic kidney disease, or unspecified chronic kidney disease: Secondary | ICD-10-CM | POA: Diagnosis not present

## 2019-11-29 DIAGNOSIS — N182 Chronic kidney disease, stage 2 (mild): Secondary | ICD-10-CM | POA: Diagnosis not present

## 2019-11-29 DIAGNOSIS — E1122 Type 2 diabetes mellitus with diabetic chronic kidney disease: Secondary | ICD-10-CM | POA: Diagnosis not present

## 2019-11-29 DIAGNOSIS — G9341 Metabolic encephalopathy: Secondary | ICD-10-CM | POA: Diagnosis not present

## 2019-11-29 NOTE — Telephone Encounter (Signed)
Verbal order given  

## 2019-11-29 NOTE — Telephone Encounter (Signed)
OK 

## 2019-11-29 NOTE — Telephone Encounter (Signed)
[facility] [employer]  Need Verbal  Order for Extention For  1 once a week for 5 week including this week    [phone] Please leave message if no answer.   Thanks,   [employer] home help

## 2019-12-03 DIAGNOSIS — D631 Anemia in chronic kidney disease: Secondary | ICD-10-CM | POA: Diagnosis not present

## 2019-12-03 DIAGNOSIS — R1310 Dysphagia, unspecified: Secondary | ICD-10-CM | POA: Diagnosis not present

## 2019-12-03 DIAGNOSIS — E876 Hypokalemia: Secondary | ICD-10-CM | POA: Diagnosis not present

## 2019-12-03 DIAGNOSIS — I129 Hypertensive chronic kidney disease with stage 1 through stage 4 chronic kidney disease, or unspecified chronic kidney disease: Secondary | ICD-10-CM | POA: Diagnosis not present

## 2019-12-03 DIAGNOSIS — E1122 Type 2 diabetes mellitus with diabetic chronic kidney disease: Secondary | ICD-10-CM | POA: Diagnosis not present

## 2019-12-03 DIAGNOSIS — N182 Chronic kidney disease, stage 2 (mild): Secondary | ICD-10-CM | POA: Diagnosis not present

## 2019-12-03 DIAGNOSIS — E1140 Type 2 diabetes mellitus with diabetic neuropathy, unspecified: Secondary | ICD-10-CM | POA: Diagnosis not present

## 2019-12-03 DIAGNOSIS — S065X0D Traumatic subdural hemorrhage without loss of consciousness, subsequent encounter: Secondary | ICD-10-CM | POA: Diagnosis not present

## 2019-12-03 DIAGNOSIS — G9341 Metabolic encephalopathy: Secondary | ICD-10-CM | POA: Diagnosis not present

## 2019-12-04 ENCOUNTER — Other Ambulatory Visit: Payer: Self-pay

## 2019-12-04 DIAGNOSIS — E876 Hypokalemia: Secondary | ICD-10-CM | POA: Diagnosis not present

## 2019-12-04 DIAGNOSIS — E1140 Type 2 diabetes mellitus with diabetic neuropathy, unspecified: Secondary | ICD-10-CM | POA: Diagnosis not present

## 2019-12-04 DIAGNOSIS — N182 Chronic kidney disease, stage 2 (mild): Secondary | ICD-10-CM | POA: Diagnosis not present

## 2019-12-04 DIAGNOSIS — S065X0D Traumatic subdural hemorrhage without loss of consciousness, subsequent encounter: Secondary | ICD-10-CM | POA: Diagnosis not present

## 2019-12-04 DIAGNOSIS — I129 Hypertensive chronic kidney disease with stage 1 through stage 4 chronic kidney disease, or unspecified chronic kidney disease: Secondary | ICD-10-CM | POA: Diagnosis not present

## 2019-12-04 DIAGNOSIS — R1310 Dysphagia, unspecified: Secondary | ICD-10-CM | POA: Diagnosis not present

## 2019-12-04 DIAGNOSIS — E1122 Type 2 diabetes mellitus with diabetic chronic kidney disease: Secondary | ICD-10-CM | POA: Diagnosis not present

## 2019-12-04 DIAGNOSIS — G9341 Metabolic encephalopathy: Secondary | ICD-10-CM | POA: Diagnosis not present

## 2019-12-04 DIAGNOSIS — D631 Anemia in chronic kidney disease: Secondary | ICD-10-CM | POA: Diagnosis not present

## 2019-12-05 ENCOUNTER — Ambulatory Visit (INDEPENDENT_AMBULATORY_CARE_PROVIDER_SITE_OTHER): Payer: Medicare HMO | Admitting: Family Medicine

## 2019-12-05 ENCOUNTER — Encounter: Payer: Self-pay | Admitting: Family Medicine

## 2019-12-05 ENCOUNTER — Other Ambulatory Visit: Payer: Self-pay

## 2019-12-05 VITALS — BP 144/80 | HR 98 | Temp 96.5°F | Ht 71.0 in | Wt 248.0 lb

## 2019-12-05 DIAGNOSIS — G4700 Insomnia, unspecified: Secondary | ICD-10-CM

## 2019-12-05 DIAGNOSIS — Z09 Encounter for follow-up examination after completed treatment for conditions other than malignant neoplasm: Secondary | ICD-10-CM | POA: Diagnosis not present

## 2019-12-05 DIAGNOSIS — R070 Pain in throat: Secondary | ICD-10-CM

## 2019-12-05 LAB — COMPREHENSIVE METABOLIC PANEL
ALT: 9 U/L (ref 0–53)
AST: 12 U/L (ref 0–37)
Albumin: 3.8 g/dL (ref 3.5–5.2)
Alkaline Phosphatase: 53 U/L (ref 39–117)
BUN: 41 mg/dL — ABNORMAL HIGH (ref 6–23)
CO2: 25 mEq/L (ref 19–32)
Calcium: 9.6 mg/dL (ref 8.4–10.5)
Chloride: 102 mEq/L (ref 96–112)
Creatinine, Ser: 1.37 mg/dL (ref 0.40–1.50)
GFR: 50.54 mL/min — ABNORMAL LOW (ref >60.00)
Glucose, Bld: 157 mg/dL — ABNORMAL HIGH (ref 70–99)
Potassium: 4.8 mEq/L (ref 3.5–5.1)
Sodium: 136 mEq/L (ref 135–145)
Total Bilirubin: 0.2 mg/dL (ref 0.2–1.2)
Total Protein: 6.3 g/dL (ref 6.0–8.3)

## 2019-12-05 LAB — MAGNESIUM: Magnesium: 1.7 mg/dL (ref 1.5–2.5)

## 2019-12-05 MED ORDER — LIDOCAINE VISCOUS HCL 2 % MT SOLN: 10.0000 mL | OROMUCOSAL | 0 refills | Status: DC | PRN

## 2019-12-05 MED ORDER — TRAZODONE HCL 50 MG PO TABS: 25.0000 mg | ORAL_TABLET | Freq: Every evening | ORAL | 3 refills | Status: DC | PRN

## 2019-12-05 MED FILL — traZODone HCL 50 MG TABS: 50 | 30 days supply | Qty: 30 | Fill #0

## 2019-12-05 MED FILL — LIDOCAINE 2% VISCOUS SOLN: 2 | 4 days supply | Qty: 200 | Fill #0

## 2019-12-05 NOTE — Progress Notes (Signed)
Chief Complaint  Patient presents with  . Hospitalization Follow-up    Subjective: Patient is a 74 y.o. male here for f/u.   Patient is following up from the hospital for subarachnoid hemorrhage.  He required surgical evacuation and a long stay in the hospital.  He just got out of a skilled nursing facility with [facility] bearing.  He continues to get stronger.  His speech is nearly back to normal.  He has no difficulty swallowing and is eating a relatively normal diet for him.  He does complain of a sore throat.  He expected to to improve by now since being intubated.  He also has difficulty sleeping.  Once he falls asleep, 5-6 hours later he is awake.  He does not feel well rested and feels fatigued in general.  He has tried melatonin at home to no avail.  ROS: Heart: Denies chest pain  Lungs: Denies SOB   Past Medical History:  Diagnosis Date  . Anemia   . Essential hypertension 07/18/2017  . History of rheumatic fever   . History of stroke 07/18/2017  . Hypertriglyceridemia 07/18/2017  . Type 2 diabetes mellitus with diabetic neuropathy, unspecified ([place]) 07/18/2017    Objective: BP (!) 144/80 (BP Location: Left Arm, Patient Position: Sitting, Cuff Size: Large)   Pulse 98   Temp (!) 96.5 F (35.8 C) (Temporal)   Ht 5\' 11"  (1.803 m)   Wt 248 lb (112.5 kg)   SpO2 93%   BMI 34.59 kg/m  General: Awake, appears stated age HEENT: MMM, EOMi, pharynx pink without erythema or exudate, I do not appreciate any postnasal drainage Heart: RRR, +SEM Lungs: CTAB, no rales, wheezes or rhonchi. No accessory muscle use Neuro: Speech is fluent, no cerebellar signs, walks with a walker Psych: Age appropriate judgment and insight, normal affect and mood  Assessment and Plan: Hospital discharge follow-up  Insomnia, unspecified type - Plan: traZODone (DESYREL) 50 MG tablet  Hypomagnesemia - Plan: Magnesium  Hypocalcemia - Plan: Comprehensive metabolic panel  Throat pain - Plan: lidocaine  (XYLOCAINE) 2 % solution  [name] looks great today.  I think he is progressing well. Low-dose trazodone for sleep.  Sleep hygiene information also provided. Follow-up on electrolyte abnormalities with blood work. Viscous lidocaine to gargle provided.  Stated that this will likely resolve on its own in the next couple weeks. Follow-up as originally scheduled. The patient voiced understanding and agreement to the plan.  [place], DO 12/05/19  11:47 AM

## 2019-12-05 NOTE — Patient Instructions (Addendum)
Give [place] 2-3 business days to get the results of your labs back.   Keep the diet clean and stay active.  Get up slowly.  Elevate your legs.  Sleep Hygiene Tips:  Do not watch TV or look at screens within 1 hour of going to bed. If you do, make sure there is a blue light filter (nighttime mode) involved.  Try to go to bed around the same time every night. Wake up at the same time within 1 hour of regular time. Ex: If you wake up at 7 AM for work, do not sleep past 8 AM on days that you don't work.  Do not drink alcohol before bedtime.  Do not consume caffeine-containing beverages after noon or within 9 hours of intended bedtime.  Get regular exercise/physical activity in your life, but not within 2 hours of planned bedtime.  Do not take naps.   Do not eat within 2 hours of planned bedtime.  Melatonin, 3-5 mg 30-60 minutes before planned bedtime may be helpful.   The bed should be for sleep or sex only. If after 20-30 minutes you are unable to fall asleep, get up and do something relaxing. Do this until you feel ready to go to sleep again.   Let us know if you need anything.

## 2019-12-07 DIAGNOSIS — E1140 Type 2 diabetes mellitus with diabetic neuropathy, unspecified: Secondary | ICD-10-CM | POA: Diagnosis not present

## 2019-12-07 DIAGNOSIS — I129 Hypertensive chronic kidney disease with stage 1 through stage 4 chronic kidney disease, or unspecified chronic kidney disease: Secondary | ICD-10-CM | POA: Diagnosis not present

## 2019-12-07 DIAGNOSIS — R1310 Dysphagia, unspecified: Secondary | ICD-10-CM | POA: Diagnosis not present

## 2019-12-07 DIAGNOSIS — S065X0D Traumatic subdural hemorrhage without loss of consciousness, subsequent encounter: Secondary | ICD-10-CM | POA: Diagnosis not present

## 2019-12-07 DIAGNOSIS — N182 Chronic kidney disease, stage 2 (mild): Secondary | ICD-10-CM | POA: Diagnosis not present

## 2019-12-07 DIAGNOSIS — G9341 Metabolic encephalopathy: Secondary | ICD-10-CM | POA: Diagnosis not present

## 2019-12-07 DIAGNOSIS — D631 Anemia in chronic kidney disease: Secondary | ICD-10-CM | POA: Diagnosis not present

## 2019-12-07 DIAGNOSIS — E1122 Type 2 diabetes mellitus with diabetic chronic kidney disease: Secondary | ICD-10-CM | POA: Diagnosis not present

## 2019-12-07 DIAGNOSIS — E876 Hypokalemia: Secondary | ICD-10-CM | POA: Diagnosis not present

## 2019-12-10 ENCOUNTER — Telehealth: Payer: Self-pay | Admitting: Family Medicine

## 2019-12-10 ENCOUNTER — Other Ambulatory Visit: Payer: Self-pay | Admitting: Family Medicine

## 2019-12-10 DIAGNOSIS — S1985XA Other specified injuries of pharynx and cervical esophagus, initial encounter: Secondary | ICD-10-CM

## 2019-12-10 DIAGNOSIS — S199XXA Unspecified injury of neck, initial encounter: Secondary | ICD-10-CM

## 2019-12-10 NOTE — Telephone Encounter (Signed)
Caller Name: [name] Phone: [phone]  Has patient seen PCP for this complaint? yes *If NO, is insurance requiring patient see PCP for this issue before PCP can refer them? Referral for which specialty: ENT  Preferred provider/office: [place] if possible Reason for referral: pt sore throat getting worse following intubation from surgery

## 2019-12-10 NOTE — Progress Notes (Signed)
a 

## 2019-12-10 NOTE — Telephone Encounter (Signed)
That's fine. Dx pharyngeal damage during intubation. Ty.

## 2019-12-10 NOTE — Telephone Encounter (Signed)
Referral done

## 2019-12-11 DIAGNOSIS — I129 Hypertensive chronic kidney disease with stage 1 through stage 4 chronic kidney disease, or unspecified chronic kidney disease: Secondary | ICD-10-CM | POA: Diagnosis not present

## 2019-12-11 DIAGNOSIS — E1140 Type 2 diabetes mellitus with diabetic neuropathy, unspecified: Secondary | ICD-10-CM | POA: Diagnosis not present

## 2019-12-11 DIAGNOSIS — E876 Hypokalemia: Secondary | ICD-10-CM | POA: Diagnosis not present

## 2019-12-11 DIAGNOSIS — R1310 Dysphagia, unspecified: Secondary | ICD-10-CM | POA: Diagnosis not present

## 2019-12-11 DIAGNOSIS — E1122 Type 2 diabetes mellitus with diabetic chronic kidney disease: Secondary | ICD-10-CM | POA: Diagnosis not present

## 2019-12-11 DIAGNOSIS — N182 Chronic kidney disease, stage 2 (mild): Secondary | ICD-10-CM | POA: Diagnosis not present

## 2019-12-11 DIAGNOSIS — D631 Anemia in chronic kidney disease: Secondary | ICD-10-CM | POA: Diagnosis not present

## 2019-12-11 DIAGNOSIS — S065X0D Traumatic subdural hemorrhage without loss of consciousness, subsequent encounter: Secondary | ICD-10-CM | POA: Diagnosis not present

## 2019-12-11 DIAGNOSIS — G9341 Metabolic encephalopathy: Secondary | ICD-10-CM | POA: Diagnosis not present

## 2019-12-13 DIAGNOSIS — E1140 Type 2 diabetes mellitus with diabetic neuropathy, unspecified: Secondary | ICD-10-CM | POA: Diagnosis not present

## 2019-12-13 DIAGNOSIS — G9341 Metabolic encephalopathy: Secondary | ICD-10-CM | POA: Diagnosis not present

## 2019-12-13 DIAGNOSIS — I129 Hypertensive chronic kidney disease with stage 1 through stage 4 chronic kidney disease, or unspecified chronic kidney disease: Secondary | ICD-10-CM | POA: Diagnosis not present

## 2019-12-13 DIAGNOSIS — D631 Anemia in chronic kidney disease: Secondary | ICD-10-CM | POA: Diagnosis not present

## 2019-12-13 DIAGNOSIS — N182 Chronic kidney disease, stage 2 (mild): Secondary | ICD-10-CM | POA: Diagnosis not present

## 2019-12-13 DIAGNOSIS — E1122 Type 2 diabetes mellitus with diabetic chronic kidney disease: Secondary | ICD-10-CM | POA: Diagnosis not present

## 2019-12-13 DIAGNOSIS — S065X0D Traumatic subdural hemorrhage without loss of consciousness, subsequent encounter: Secondary | ICD-10-CM | POA: Diagnosis not present

## 2019-12-13 DIAGNOSIS — E876 Hypokalemia: Secondary | ICD-10-CM | POA: Diagnosis not present

## 2019-12-13 DIAGNOSIS — R1310 Dysphagia, unspecified: Secondary | ICD-10-CM | POA: Diagnosis not present

## 2019-12-14 DIAGNOSIS — E876 Hypokalemia: Secondary | ICD-10-CM | POA: Diagnosis not present

## 2019-12-14 DIAGNOSIS — S065X0D Traumatic subdural hemorrhage without loss of consciousness, subsequent encounter: Secondary | ICD-10-CM | POA: Diagnosis not present

## 2019-12-14 DIAGNOSIS — D631 Anemia in chronic kidney disease: Secondary | ICD-10-CM | POA: Diagnosis not present

## 2019-12-14 DIAGNOSIS — J029 Acute pharyngitis, unspecified: Secondary | ICD-10-CM | POA: Diagnosis not present

## 2019-12-14 DIAGNOSIS — I129 Hypertensive chronic kidney disease with stage 1 through stage 4 chronic kidney disease, or unspecified chronic kidney disease: Secondary | ICD-10-CM | POA: Diagnosis not present

## 2019-12-14 DIAGNOSIS — N182 Chronic kidney disease, stage 2 (mild): Secondary | ICD-10-CM | POA: Diagnosis not present

## 2019-12-14 DIAGNOSIS — E1140 Type 2 diabetes mellitus with diabetic neuropathy, unspecified: Secondary | ICD-10-CM | POA: Diagnosis not present

## 2019-12-14 DIAGNOSIS — R1310 Dysphagia, unspecified: Secondary | ICD-10-CM | POA: Diagnosis not present

## 2019-12-14 DIAGNOSIS — E1122 Type 2 diabetes mellitus with diabetic chronic kidney disease: Secondary | ICD-10-CM | POA: Diagnosis not present

## 2019-12-14 DIAGNOSIS — K219 Gastro-esophageal reflux disease without esophagitis: Secondary | ICD-10-CM | POA: Diagnosis not present

## 2019-12-14 DIAGNOSIS — G9341 Metabolic encephalopathy: Secondary | ICD-10-CM | POA: Diagnosis not present

## 2019-12-18 ENCOUNTER — Other Ambulatory Visit: Payer: Self-pay | Admitting: Family Medicine

## 2019-12-18 DIAGNOSIS — I129 Hypertensive chronic kidney disease with stage 1 through stage 4 chronic kidney disease, or unspecified chronic kidney disease: Secondary | ICD-10-CM | POA: Diagnosis not present

## 2019-12-18 DIAGNOSIS — N182 Chronic kidney disease, stage 2 (mild): Secondary | ICD-10-CM | POA: Diagnosis not present

## 2019-12-18 DIAGNOSIS — D631 Anemia in chronic kidney disease: Secondary | ICD-10-CM | POA: Diagnosis not present

## 2019-12-18 DIAGNOSIS — R1310 Dysphagia, unspecified: Secondary | ICD-10-CM | POA: Diagnosis not present

## 2019-12-18 DIAGNOSIS — S065X0D Traumatic subdural hemorrhage without loss of consciousness, subsequent encounter: Secondary | ICD-10-CM | POA: Diagnosis not present

## 2019-12-18 DIAGNOSIS — E1140 Type 2 diabetes mellitus with diabetic neuropathy, unspecified: Secondary | ICD-10-CM | POA: Diagnosis not present

## 2019-12-18 DIAGNOSIS — G9341 Metabolic encephalopathy: Secondary | ICD-10-CM | POA: Diagnosis not present

## 2019-12-18 DIAGNOSIS — E876 Hypokalemia: Secondary | ICD-10-CM | POA: Diagnosis not present

## 2019-12-18 DIAGNOSIS — E1122 Type 2 diabetes mellitus with diabetic chronic kidney disease: Secondary | ICD-10-CM | POA: Diagnosis not present

## 2019-12-20 DIAGNOSIS — E876 Hypokalemia: Secondary | ICD-10-CM | POA: Diagnosis not present

## 2019-12-20 DIAGNOSIS — N182 Chronic kidney disease, stage 2 (mild): Secondary | ICD-10-CM | POA: Diagnosis not present

## 2019-12-20 DIAGNOSIS — E1140 Type 2 diabetes mellitus with diabetic neuropathy, unspecified: Secondary | ICD-10-CM | POA: Diagnosis not present

## 2019-12-20 DIAGNOSIS — D631 Anemia in chronic kidney disease: Secondary | ICD-10-CM | POA: Diagnosis not present

## 2019-12-20 DIAGNOSIS — E1122 Type 2 diabetes mellitus with diabetic chronic kidney disease: Secondary | ICD-10-CM | POA: Diagnosis not present

## 2019-12-20 DIAGNOSIS — G9341 Metabolic encephalopathy: Secondary | ICD-10-CM | POA: Diagnosis not present

## 2019-12-20 DIAGNOSIS — S065X0D Traumatic subdural hemorrhage without loss of consciousness, subsequent encounter: Secondary | ICD-10-CM | POA: Diagnosis not present

## 2019-12-20 DIAGNOSIS — R1310 Dysphagia, unspecified: Secondary | ICD-10-CM | POA: Diagnosis not present

## 2019-12-20 DIAGNOSIS — I129 Hypertensive chronic kidney disease with stage 1 through stage 4 chronic kidney disease, or unspecified chronic kidney disease: Secondary | ICD-10-CM | POA: Diagnosis not present

## 2019-12-21 DIAGNOSIS — N182 Chronic kidney disease, stage 2 (mild): Secondary | ICD-10-CM | POA: Diagnosis not present

## 2019-12-21 DIAGNOSIS — S065X0D Traumatic subdural hemorrhage without loss of consciousness, subsequent encounter: Secondary | ICD-10-CM | POA: Diagnosis not present

## 2019-12-21 DIAGNOSIS — E1140 Type 2 diabetes mellitus with diabetic neuropathy, unspecified: Secondary | ICD-10-CM | POA: Diagnosis not present

## 2019-12-21 DIAGNOSIS — E1122 Type 2 diabetes mellitus with diabetic chronic kidney disease: Secondary | ICD-10-CM | POA: Diagnosis not present

## 2019-12-21 DIAGNOSIS — R1310 Dysphagia, unspecified: Secondary | ICD-10-CM | POA: Diagnosis not present

## 2019-12-21 DIAGNOSIS — I129 Hypertensive chronic kidney disease with stage 1 through stage 4 chronic kidney disease, or unspecified chronic kidney disease: Secondary | ICD-10-CM | POA: Diagnosis not present

## 2019-12-21 DIAGNOSIS — D631 Anemia in chronic kidney disease: Secondary | ICD-10-CM | POA: Diagnosis not present

## 2019-12-21 DIAGNOSIS — E876 Hypokalemia: Secondary | ICD-10-CM | POA: Diagnosis not present

## 2019-12-21 DIAGNOSIS — G9341 Metabolic encephalopathy: Secondary | ICD-10-CM | POA: Diagnosis not present

## 2019-12-26 DIAGNOSIS — R69 Illness, unspecified: Secondary | ICD-10-CM | POA: Diagnosis not present

## 2019-12-27 DIAGNOSIS — I129 Hypertensive chronic kidney disease with stage 1 through stage 4 chronic kidney disease, or unspecified chronic kidney disease: Secondary | ICD-10-CM | POA: Diagnosis not present

## 2019-12-27 DIAGNOSIS — E1122 Type 2 diabetes mellitus with diabetic chronic kidney disease: Secondary | ICD-10-CM | POA: Diagnosis not present

## 2019-12-27 DIAGNOSIS — N182 Chronic kidney disease, stage 2 (mild): Secondary | ICD-10-CM | POA: Diagnosis not present

## 2019-12-27 DIAGNOSIS — S065X0D Traumatic subdural hemorrhage without loss of consciousness, subsequent encounter: Secondary | ICD-10-CM | POA: Diagnosis not present

## 2019-12-27 DIAGNOSIS — E876 Hypokalemia: Secondary | ICD-10-CM | POA: Diagnosis not present

## 2019-12-27 DIAGNOSIS — R1310 Dysphagia, unspecified: Secondary | ICD-10-CM | POA: Diagnosis not present

## 2019-12-27 DIAGNOSIS — G9341 Metabolic encephalopathy: Secondary | ICD-10-CM | POA: Diagnosis not present

## 2019-12-27 DIAGNOSIS — E1140 Type 2 diabetes mellitus with diabetic neuropathy, unspecified: Secondary | ICD-10-CM | POA: Diagnosis not present

## 2019-12-27 DIAGNOSIS — D631 Anemia in chronic kidney disease: Secondary | ICD-10-CM | POA: Diagnosis not present

## 2019-12-28 ENCOUNTER — Telehealth: Payer: Self-pay

## 2019-12-28 DIAGNOSIS — S065X0D Traumatic subdural hemorrhage without loss of consciousness, subsequent encounter: Secondary | ICD-10-CM | POA: Diagnosis not present

## 2019-12-28 DIAGNOSIS — D631 Anemia in chronic kidney disease: Secondary | ICD-10-CM | POA: Diagnosis not present

## 2019-12-28 DIAGNOSIS — I129 Hypertensive chronic kidney disease with stage 1 through stage 4 chronic kidney disease, or unspecified chronic kidney disease: Secondary | ICD-10-CM | POA: Diagnosis not present

## 2019-12-28 DIAGNOSIS — R1310 Dysphagia, unspecified: Secondary | ICD-10-CM | POA: Diagnosis not present

## 2019-12-28 DIAGNOSIS — E876 Hypokalemia: Secondary | ICD-10-CM | POA: Diagnosis not present

## 2019-12-28 DIAGNOSIS — N182 Chronic kidney disease, stage 2 (mild): Secondary | ICD-10-CM | POA: Diagnosis not present

## 2019-12-28 DIAGNOSIS — E1122 Type 2 diabetes mellitus with diabetic chronic kidney disease: Secondary | ICD-10-CM | POA: Diagnosis not present

## 2019-12-28 DIAGNOSIS — E1140 Type 2 diabetes mellitus with diabetic neuropathy, unspecified: Secondary | ICD-10-CM | POA: Diagnosis not present

## 2019-12-28 DIAGNOSIS — G9341 Metabolic encephalopathy: Secondary | ICD-10-CM | POA: Diagnosis not present

## 2019-12-28 NOTE — Telephone Encounter (Signed)
[facility], OT, called to you know pt will be discharged from OT today.  Pt met all goals and is doing well and he will continue PT a few more weeks and if you have any questions, please call [facility] at [phone].

## 2020-01-01 DIAGNOSIS — Z6837 Body mass index (BMI) 37.0-37.9, adult: Secondary | ICD-10-CM | POA: Diagnosis not present

## 2020-01-01 DIAGNOSIS — D631 Anemia in chronic kidney disease: Secondary | ICD-10-CM | POA: Diagnosis not present

## 2020-01-01 DIAGNOSIS — E782 Mixed hyperlipidemia: Secondary | ICD-10-CM | POA: Diagnosis not present

## 2020-01-01 DIAGNOSIS — S065X0D Traumatic subdural hemorrhage without loss of consciousness, subsequent encounter: Secondary | ICD-10-CM | POA: Diagnosis not present

## 2020-01-01 DIAGNOSIS — G9341 Metabolic encephalopathy: Secondary | ICD-10-CM | POA: Diagnosis not present

## 2020-01-01 DIAGNOSIS — E1140 Type 2 diabetes mellitus with diabetic neuropathy, unspecified: Secondary | ICD-10-CM | POA: Diagnosis not present

## 2020-01-01 DIAGNOSIS — E876 Hypokalemia: Secondary | ICD-10-CM | POA: Diagnosis not present

## 2020-01-01 DIAGNOSIS — R1310 Dysphagia, unspecified: Secondary | ICD-10-CM | POA: Diagnosis not present

## 2020-01-01 DIAGNOSIS — I129 Hypertensive chronic kidney disease with stage 1 through stage 4 chronic kidney disease, or unspecified chronic kidney disease: Secondary | ICD-10-CM | POA: Diagnosis not present

## 2020-01-01 DIAGNOSIS — E1122 Type 2 diabetes mellitus with diabetic chronic kidney disease: Secondary | ICD-10-CM | POA: Diagnosis not present

## 2020-01-01 DIAGNOSIS — E119 Type 2 diabetes mellitus without complications: Secondary | ICD-10-CM | POA: Diagnosis not present

## 2020-01-01 DIAGNOSIS — N182 Chronic kidney disease, stage 2 (mild): Secondary | ICD-10-CM | POA: Diagnosis not present

## 2020-01-08 DIAGNOSIS — G8929 Other chronic pain: Secondary | ICD-10-CM | POA: Diagnosis not present

## 2020-01-08 DIAGNOSIS — E669 Obesity, unspecified: Secondary | ICD-10-CM | POA: Diagnosis not present

## 2020-01-08 DIAGNOSIS — Z85828 Personal history of other malignant neoplasm of skin: Secondary | ICD-10-CM | POA: Diagnosis not present

## 2020-01-08 DIAGNOSIS — E1142 Type 2 diabetes mellitus with diabetic polyneuropathy: Secondary | ICD-10-CM | POA: Diagnosis not present

## 2020-01-08 DIAGNOSIS — E785 Hyperlipidemia, unspecified: Secondary | ICD-10-CM | POA: Diagnosis not present

## 2020-01-08 DIAGNOSIS — Z87891 Personal history of nicotine dependence: Secondary | ICD-10-CM | POA: Diagnosis not present

## 2020-01-08 DIAGNOSIS — Z9181 History of falling: Secondary | ICD-10-CM | POA: Diagnosis not present

## 2020-01-08 DIAGNOSIS — Z794 Long term (current) use of insulin: Secondary | ICD-10-CM | POA: Diagnosis not present

## 2020-01-08 DIAGNOSIS — I10 Essential (primary) hypertension: Secondary | ICD-10-CM | POA: Diagnosis not present

## 2020-01-08 DIAGNOSIS — Z8673 Personal history of transient ischemic attack (TIA), and cerebral infarction without residual deficits: Secondary | ICD-10-CM | POA: Diagnosis not present

## 2020-01-09 DIAGNOSIS — I129 Hypertensive chronic kidney disease with stage 1 through stage 4 chronic kidney disease, or unspecified chronic kidney disease: Secondary | ICD-10-CM | POA: Diagnosis not present

## 2020-01-09 DIAGNOSIS — E876 Hypokalemia: Secondary | ICD-10-CM | POA: Diagnosis not present

## 2020-01-09 DIAGNOSIS — S065X0D Traumatic subdural hemorrhage without loss of consciousness, subsequent encounter: Secondary | ICD-10-CM | POA: Diagnosis not present

## 2020-01-09 DIAGNOSIS — N182 Chronic kidney disease, stage 2 (mild): Secondary | ICD-10-CM | POA: Diagnosis not present

## 2020-01-09 DIAGNOSIS — E1122 Type 2 diabetes mellitus with diabetic chronic kidney disease: Secondary | ICD-10-CM | POA: Diagnosis not present

## 2020-01-09 DIAGNOSIS — E1140 Type 2 diabetes mellitus with diabetic neuropathy, unspecified: Secondary | ICD-10-CM | POA: Diagnosis not present

## 2020-01-09 DIAGNOSIS — G9341 Metabolic encephalopathy: Secondary | ICD-10-CM | POA: Diagnosis not present

## 2020-01-09 DIAGNOSIS — R1310 Dysphagia, unspecified: Secondary | ICD-10-CM | POA: Diagnosis not present

## 2020-01-09 DIAGNOSIS — D631 Anemia in chronic kidney disease: Secondary | ICD-10-CM | POA: Diagnosis not present

## 2020-01-14 ENCOUNTER — Other Ambulatory Visit: Payer: Self-pay

## 2020-01-14 ENCOUNTER — Emergency Department (HOSPITAL_BASED_OUTPATIENT_CLINIC_OR_DEPARTMENT_OTHER): Payer: Medicare HMO

## 2020-01-14 ENCOUNTER — Emergency Department (HOSPITAL_BASED_OUTPATIENT_CLINIC_OR_DEPARTMENT_OTHER)
Admission: EM | Admit: 2020-01-14 | Discharge: 2020-01-14 | Disposition: A | Discharge status: Home / Self Care | Payer: Medicare HMO | Attending: Emergency Medicine | Admitting: Emergency Medicine

## 2020-01-14 ENCOUNTER — Encounter (HOSPITAL_BASED_OUTPATIENT_CLINIC_OR_DEPARTMENT_OTHER): Payer: Self-pay | Admitting: Emergency Medicine

## 2020-01-14 DIAGNOSIS — I129 Hypertensive chronic kidney disease with stage 1 through stage 4 chronic kidney disease, or unspecified chronic kidney disease: Secondary | ICD-10-CM | POA: Insufficient documentation

## 2020-01-14 DIAGNOSIS — Z79899 Other long term (current) drug therapy: Secondary | ICD-10-CM | POA: Diagnosis not present

## 2020-01-14 DIAGNOSIS — E1140 Type 2 diabetes mellitus with diabetic neuropathy, unspecified: Secondary | ICD-10-CM | POA: Diagnosis not present

## 2020-01-14 DIAGNOSIS — Z8673 Personal history of transient ischemic attack (TIA), and cerebral infarction without residual deficits: Secondary | ICD-10-CM | POA: Diagnosis not present

## 2020-01-14 DIAGNOSIS — N189 Chronic kidney disease, unspecified: Secondary | ICD-10-CM | POA: Insufficient documentation

## 2020-01-14 DIAGNOSIS — Z87891 Personal history of nicotine dependence: Secondary | ICD-10-CM | POA: Diagnosis not present

## 2020-01-14 DIAGNOSIS — M7989 Other specified soft tissue disorders: Secondary | ICD-10-CM | POA: Diagnosis not present

## 2020-01-14 DIAGNOSIS — Z794 Long term (current) use of insulin: Secondary | ICD-10-CM | POA: Diagnosis not present

## 2020-01-14 DIAGNOSIS — M25531 Pain in right wrist: Secondary | ICD-10-CM | POA: Diagnosis not present

## 2020-01-14 DIAGNOSIS — E1122 Type 2 diabetes mellitus with diabetic chronic kidney disease: Secondary | ICD-10-CM | POA: Insufficient documentation

## 2020-01-14 IMAGING — DX DG WRIST COMPLETE 3+V*R*
4 series · 4 of 4 positions shown · non-contrast
Comparison: None.

CLINICAL DATA: Pain and swelling

EXAM:
RIGHT WRIST - COMPLETE 3+ VIEW

[wrist pa]
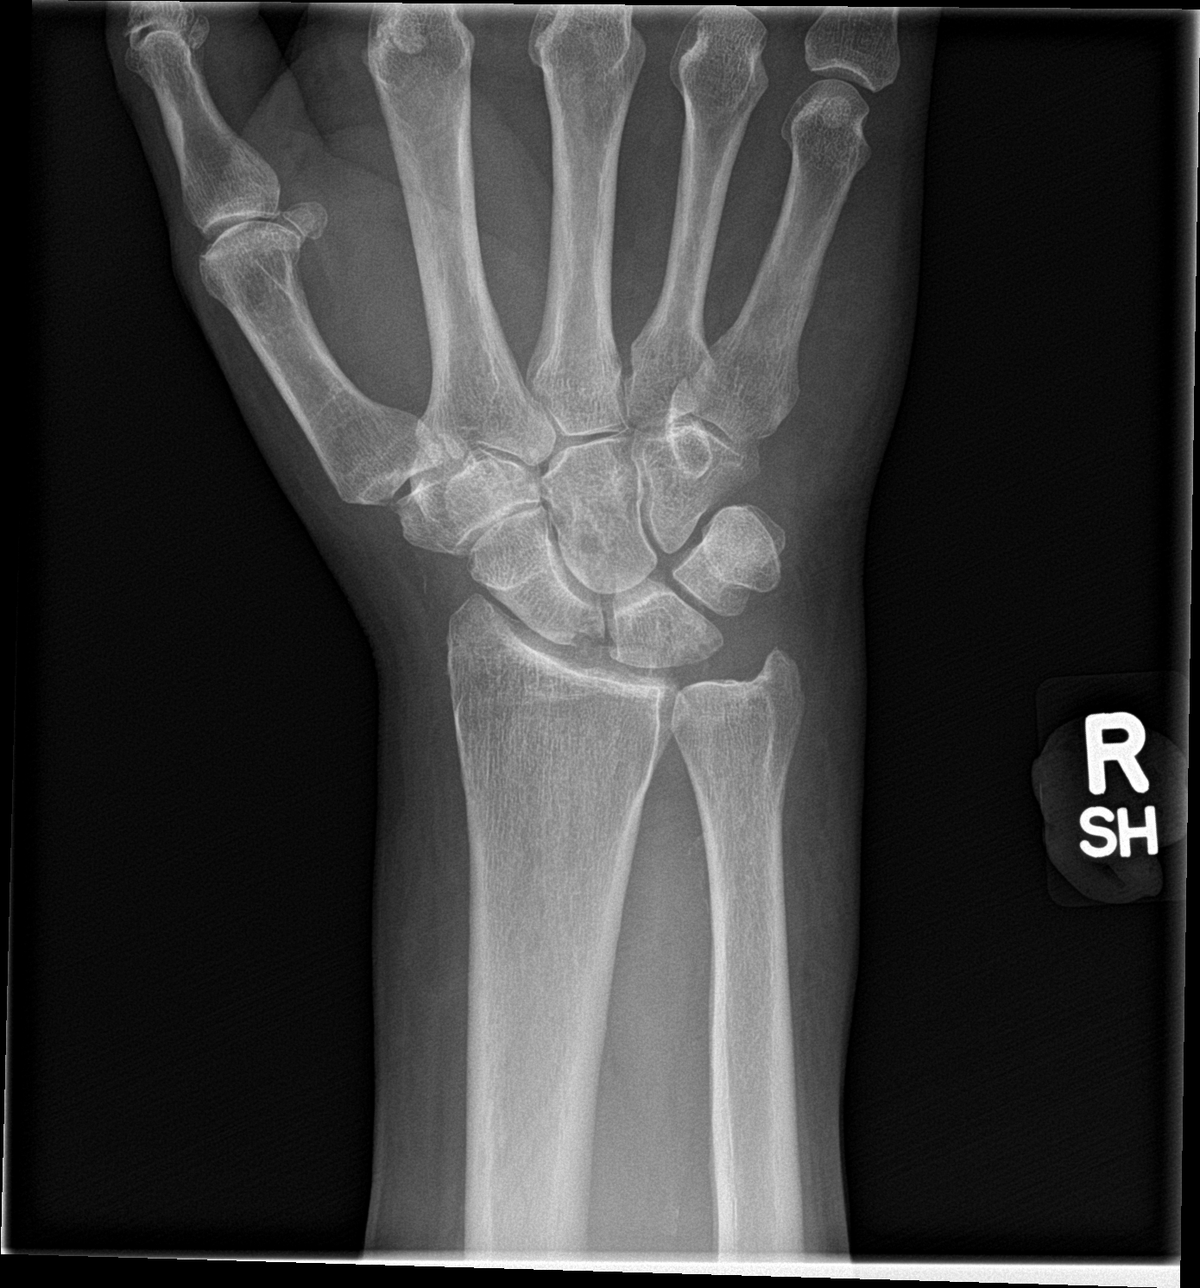

[wrist obl]
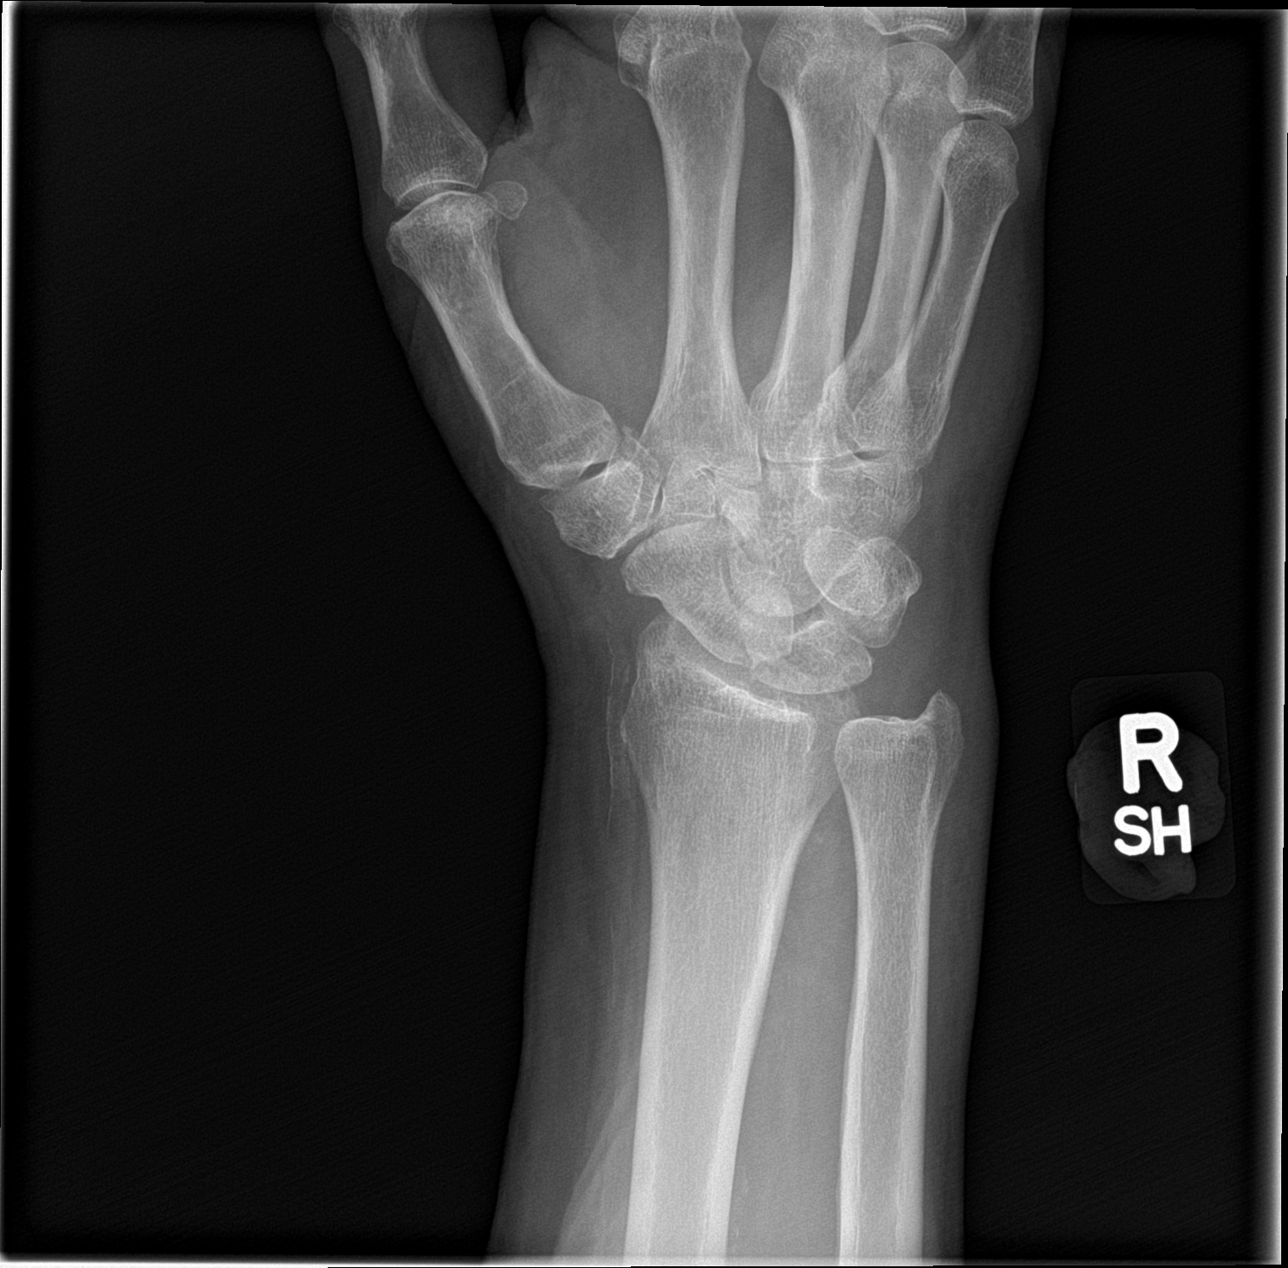

[wrist lat]
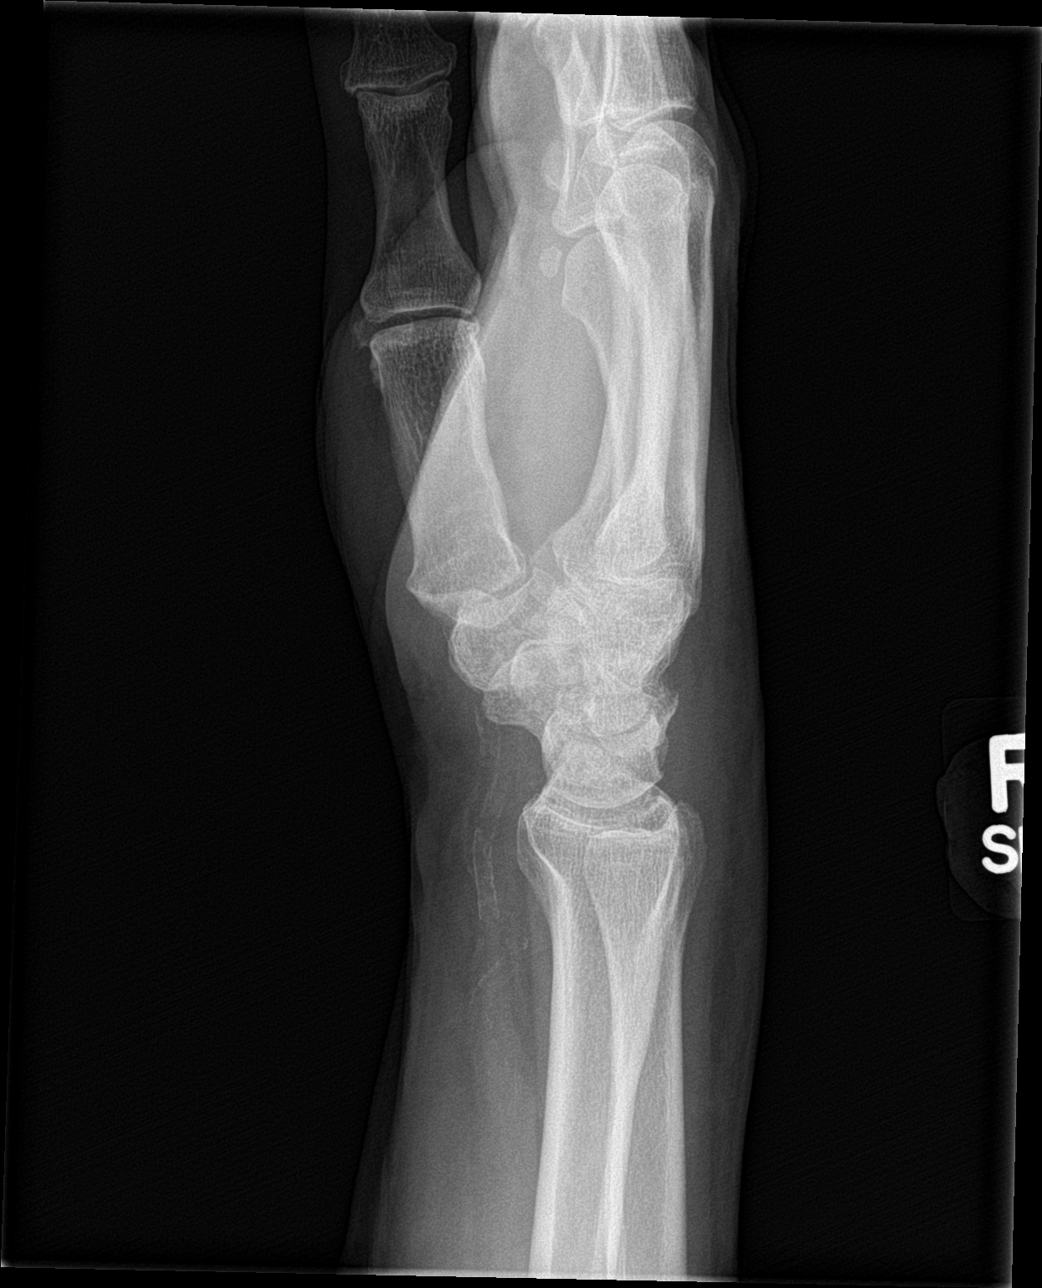

[wrist navicular]
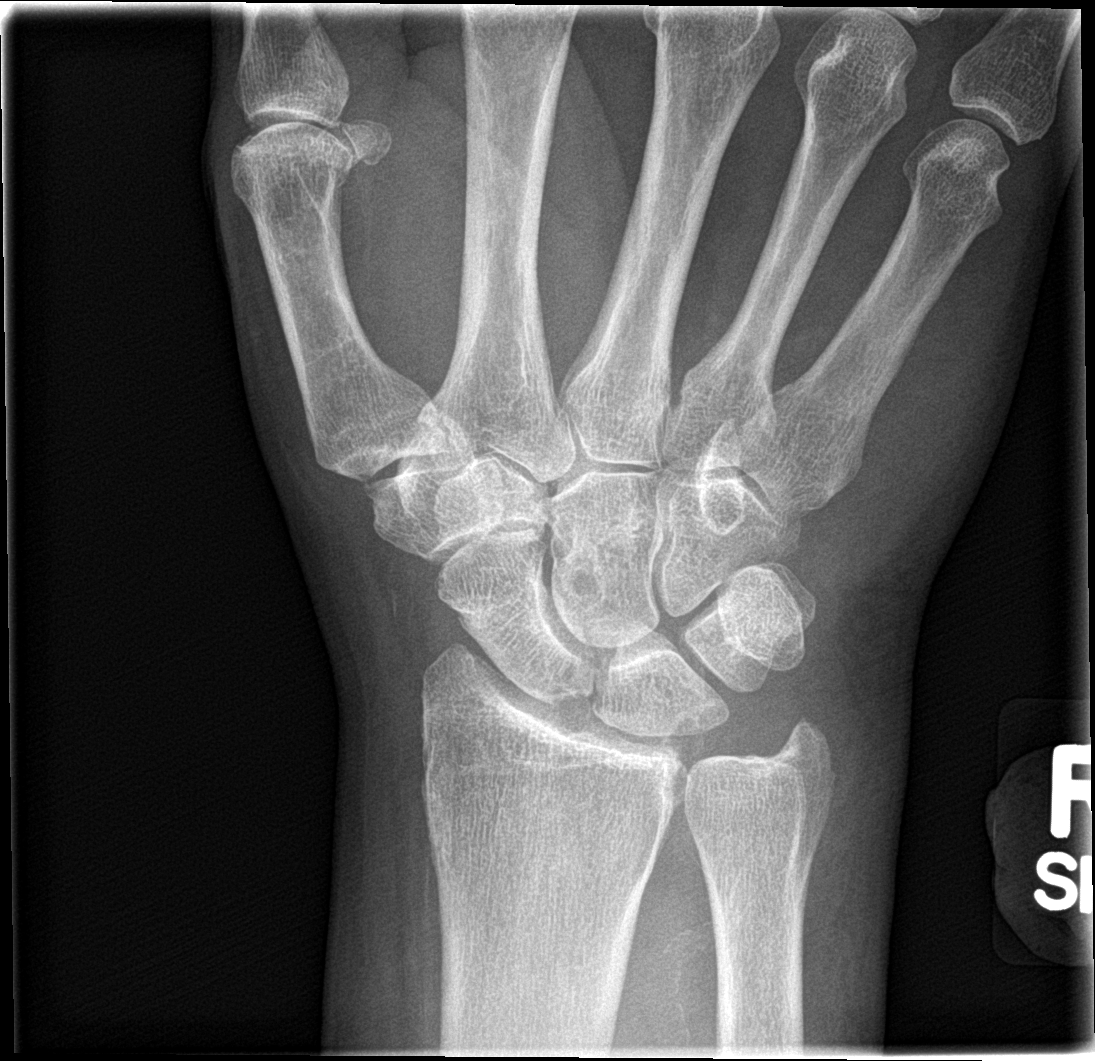

[4 of 4 positions shown; findings below may reference images not displayed]

FINDINGS: Frontal, oblique, lateral, and ulnar deviation scaphoid images were
obtained. No fracture or dislocation. There is erosive change along
the proximal scaphoid. There is subchondral cystic change along the
proximal lunate. A small benign cyst is present in the capitate
proximally. There is mild narrowing at the radiocarpal joint. There
is moderate osteoarthritic change with narrowing at the
scaphotrapezial joint.
IMPRESSION: No fracture or dislocation. Areas of joint space narrowing. Focal
erosion along the proximal scaphoid bone. No other erosive change
evident.

## 2020-01-14 MED ORDER — KETOROLAC TROMETHAMINE 15 MG/ML IJ SOLN
15.0000 mg | Freq: Once | INTRAMUSCULAR | Status: AC
  Administered 2020-01-14: 15 mg via INTRAMUSCULAR
  Filled 2020-01-14: qty 1

## 2020-01-14 MED ORDER — PREDNISONE 20 MG PO TABS: 20.0000 mg | ORAL_TABLET | Freq: Every day | ORAL | 0 refills | Status: DC

## 2020-01-14 MED ORDER — PREDNISONE 20 MG PO TABS
20.0000 mg | ORAL_TABLET | Freq: Once | ORAL | Status: AC
  Administered 2020-01-14: 12:00:00 20 mg via ORAL
  Filled 2020-01-14: qty 1

## 2020-01-14 MED FILL — predniSONE 20 MG TABS: 20 | 4 days supply | Qty: 4 | Fill #0

## 2020-01-14 NOTE — ED Notes (Signed)
ED Provider at bedside. 

## 2020-01-14 NOTE — Discharge Instructions (Addendum)
You are seen in the emergency department for evaluation of right wrist pain.  You had x-rays that did not show any obvious fracture.  We are putting you into a removable splint for comfort.  Prednisone for 4 more days.  Please contact your primary care doctor for follow-up.  We also placed a referral in for rheumatology.  Return to the emergency department if any fever or worsening symptoms.

## 2020-01-14 NOTE — ED Triage Notes (Signed)
Pt presents with c/o right wrist pain that started Thursday and has gotten worse since even with ice and heat over the weekend. Swelling noted to right wrist. Cardboard splint applied to right wrist.

## 2020-01-14 NOTE — ED Provider Notes (Signed)
[facility] EMERGENCY DEPARTMENT Provider Note   CSN: [id number] Arrival date & time: 01/14/20  0915     History Chief Complaint  Patient presents with  . Wrist Pain    Tommy Green is a 74 y.o. male.  He is complaining of right wrist pain that started about 5 days ago.  There is no reported trauma.  He said it has been getting progressively worse despite ice and heat.  Painful with any movement.  He has had similar joint issues before without diagnosis.  Has had rheumatoid testing that was negative.  No fevers or chills.  No numbness or weakness.  The history is provided by the patient.  Wrist Pain This is a recurrent problem. The current episode started more than 2 days ago. The problem occurs constantly. The problem has been gradually worsening. Pertinent negatives include no chest pain, no abdominal pain, no headaches and no shortness of breath. The symptoms are aggravated by bending and twisting. Nothing relieves the symptoms. He has tried a cold compress and a warm compress for the symptoms. The treatment provided no relief.       Past Medical History:  Diagnosis Date  . Anemia   . Essential hypertension 07/18/2017  . History of rheumatic fever   . History of stroke 07/18/2017  . Hypertriglyceridemia 07/18/2017  . Type 2 diabetes mellitus with diabetic neuropathy, unspecified ([place]) 07/18/2017    Patient Active Problem List   Diagnosis Date Noted  . Acute kidney injury superimposed on chronic kidney disease ([place])   . Respiratory failure ([place])   . Acute encephalopathy   . Hypernatremia   . Dyspnea   . Acute respiratory failure with hypoxemia ([place])   . SDH (subdural hematoma) ([place]) 10/24/2019  . Chronic pain of right knee 10/03/2019  . Cyclic vomiting syndrome 08/06/2019  . Neuropathy 12/25/2018  . Bilateral hip pain 08/24/2018  . AK (actinic keratosis) 08/24/2018  . Neoplasm of uncertain behavior 0000000  . 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  Tendinopathy of right gluteus medius 07/13/2018  . Tendinopathy of left gluteus medius 07/13/2018  . Squamous cell carcinoma in situ (SCCIS) of skin 03/24/2018  . Skin lesion 03/17/2018  . Essential hypertension 07/18/2017  . Hyperlipidemia associated with type 2 diabetes mellitus ([place]) 07/18/2017  . Type 2 diabetes mellitus with diabetic neuropathy, unspecified ([place]) 07/18/2017  . Heart murmur 07/18/2017  . Hypertriglyceridemia 07/18/2017  . History of stroke 07/18/2017  . Pain in joint, ankle and foot 02/06/2013    Past Surgical History:  Procedure Laterality Date  . CRANIOTOMY Left 10/25/2019   Procedure: CRANIOTOMY HEMATOMA EVACUATION SUBDURAL;  Surgeon: Vallarie Mare, MD;  Location: [facility];  Service: Neurosurgery;  Laterality: Left;  . FOOT CAPSULOTOMY Left 07/25/2008   Mid Foot #2 MPJ  . Hammertoe Repair Left 07/25/2008   #2 toe  . SPINAL FUSION    . TARSAL TUNNEL RELEASE Left 07/25/2008       Family History  Problem Relation Age of Onset  . Hyperlipidemia Mother   . Hyperlipidemia Father   . Cancer Neg Hx     Social History   Tobacco Use  . Smoking status: Former Smoker    Types: Cigarettes  . Smokeless tobacco: Never Used  . Tobacco comment: Quit in 1973  Substance Use Topics  . Alcohol use: No  . Drug use: No    Home Medications Prior to Admission medications   Medication Sig Start Date End Date Taking? Authorizing Provider  carvedilol (COREG)  12.5 MG tablet TAKE 1 TABLET TWICE DAILY  WITH MEALS 12/18/19   Wendling, Crosby Oyster, DO  docusate sodium (COLACE) 100 MG capsule Take 1 capsule (100 mg total) by mouth daily as needed. 11/16/19   Pokhrel, Corrie Mckusick, MD  fenofibrate micronized (LOFIBRA) 200 MG capsule TAKE 1 CAPSULE DAILY BEFOREBREAKFAST Patient taking differently: Take 200 mg by mouth daily before breakfast.  02/21/19   Wendling, Crosby Oyster, DO  gabapentin (NEURONTIN) 600 MG tablet TAKE 1 TABLET 3 TIMES A DAY Patient taking differently: Take 600 mg  by mouth 3 (three) times daily.  08/06/19   [name], DO  hydrochlorothiazide (HYDRODIURIL) 25 MG tablet TAKE 1 TABLET DAILY Patient taking differently: Take 25 mg by mouth daily.  02/12/19   [name], DO  insulin aspart (NOVOLOG) 100 UNIT/ML injection Inject 0-20 Units into the skin 4 (four) times daily -  before meals and at bedtime. 11/16/19   Pokhrel, Corrie Mckusick, MD  insulin degludec (TRESIBA) 100 UNIT/ML SOPN FlexTouch Pen Inject 0.6 mLs (60 Units total) into the skin at bedtime. 09/26/19   [name], DO  lidocaine (XYLOCAINE) 2 % solution Use as directed 10 mLs in the mouth or throat every 4 (four) hours as needed for mouth pain. Gargle, do not swallow. 12/05/19   [name], DO  lisinopril (ZESTRIL) 40 MG tablet TAKE 1 TABLET DAILY Patient taking differently: Take 40 mg by mouth daily.  02/21/19   [name], DO  lovastatin (MEVACOR) 10 MG tablet Take 1 tablet (10 mg total) by mouth at bedtime. 10/23/19   [name], DO  metFORMIN (GLUCOPHAGE) 500 MG tablet Take 500 mg by mouth 2 (two) times daily with a meal.    [provider]  Multiple Vitamins-Minerals (MULTIVITAMIN WITH MINERALS) tablet Take 1 tablet by mouth daily.    [provider]  potassium chloride (KLOR-CON) 10 MEQ tablet Take 1 tablet (10 mEq total) by mouth daily for 10 days. 11/16/19 11/26/19  Pokhrel, Corrie Mckusick, MD  thiamine 100 MG tablet Take 1 tablet (100 mg total) by mouth daily. 11/16/19   Pokhrel, Corrie Mckusick, MD  traZODone (DESYREL) 50 MG tablet Take 0.5-1 tablets (25-50 mg total) by mouth at bedtime as needed for sleep. 12/05/19   [name], DO  vitamin B-12 (CYANOCOBALAMIN) 1000 MCG tablet Take 1,000 mcg by mouth daily.    [provider]    Allergies    Influenza vaccines, Aspirin, Atorvastatin, Canagliflozin, and Statins  Review of Systems   Review of Systems  Constitutional: Negative for chills and fever.    Respiratory: Negative for shortness of breath.   Cardiovascular: Negative for chest pain.  Gastrointestinal: Negative for abdominal pain.  Musculoskeletal: Positive for arthralgias.  Skin: Negative for wound.  Neurological: Negative for headaches.    Physical Exam Updated Vital Signs BP (!) 128/59 (BP Location: Left Arm)   Pulse 77   Temp 98.7 F (37.1 C) (Oral)   Resp 17   Ht 5\' 11"  (1.803 m)   Wt 106.6 kg   SpO2 97%   BMI 32.78 kg/m   Physical Exam Vitals and nursing note reviewed.  Constitutional:      Appearance: He is well-developed.  HENT:     Head: Normocephalic and atraumatic.  Eyes:     Conjunctiva/sclera: Conjunctivae normal.  Pulmonary:     Effort: Pulmonary effort is normal.  Musculoskeletal:        General: Swelling and tenderness present.     Cervical back:  Neck supple.     Comments: Right upper extremity nontender shoulder elbow.  Diffuse tenderness throughout his wrist.  Pain with range of motion.  Minimal swelling.  No erythema or warmth.  No open wounds.  Distal motor sensory and cap refill intact.  Skin:    General: Skin is warm and dry.     Capillary Refill: Capillary refill takes less than 2 seconds.  Neurological:     General: No focal deficit present.     Mental Status: He is alert.     GCS: GCS eye subscore is 4. GCS verbal subscore is 5. GCS motor subscore is 6.     ED Results / Procedures / Treatments   Labs (all labs ordered are listed, but only abnormal results are displayed) Labs Reviewed - No data to display  EKG None  Radiology DG Wrist Complete Right  Result Date: 01/14/2020 CLINICAL DATA:  Pain and swelling EXAM: RIGHT WRIST - COMPLETE 3+ VIEW COMPARISON:  None. FINDINGS: Frontal, oblique, lateral, and ulnar deviation scaphoid images were obtained. No fracture or dislocation. There is erosive change along the proximal scaphoid. There is subchondral cystic change along the proximal lunate. A small benign cyst is present in the  capitate proximally. There is mild narrowing at the radiocarpal joint. There is moderate osteoarthritic change with narrowing at the scaphotrapezial joint. IMPRESSION: No fracture or dislocation. Areas of joint space narrowing. Focal erosion along the proximal scaphoid bone. No other erosive change evident. Electronically Signed   By: Lowella Grip III M.D.   On: 01/14/2020 10:01    Procedures Procedures (including critical care time)  Medications Ordered in ED Medications - No data to display  ED Course  I have reviewed the triage vital signs and the nursing notes.  Pertinent labs & imaging results that were available during my care of the patient were reviewed by me and considered in my medical decision making (see chart for details).  Clinical Course as of Jan 14 1903  Mon Jan 14, 2020  1025 Differential includes inflammatory arthritis, tendinitis, gout, less likely septic joint, fracture, dislocation   [MB]  1026 X-ray showing some erosion but no fracture or dislocation.  Will splint and trial him on some steroids as this seems to worked in the past for him.   [MB]  [id number] Reviewed prior visit in December where he had a flareup of some hand arthritis.  At that time he was treated with Toradol shot and then low-dose steroids with improvement in his symptoms.  I think it would be reasonable to try this again.   [MB]    Clinical Course User Index [MB] Hayden Rasmussen, MD   MDM Rules/Calculators/A&P                       Final Clinical Impression(s) / ED Diagnoses Final diagnoses:  Right wrist pain    Rx / DC Orders ED Discharge Orders         Ordered    predniSONE (DELTASONE) 20 MG tablet  Daily,   Status:  Discontinued     01/14/20 1151    Ambulatory referral to Rheumatology     01/14/20 1151    predniSONE (DELTASONE) 20 MG tablet  Daily     01/14/20 1202           Hayden Rasmussen, MD 01/14/20 1905

## 2020-01-16 DIAGNOSIS — E1140 Type 2 diabetes mellitus with diabetic neuropathy, unspecified: Secondary | ICD-10-CM | POA: Diagnosis not present

## 2020-01-16 DIAGNOSIS — D631 Anemia in chronic kidney disease: Secondary | ICD-10-CM | POA: Diagnosis not present

## 2020-01-16 DIAGNOSIS — I129 Hypertensive chronic kidney disease with stage 1 through stage 4 chronic kidney disease, or unspecified chronic kidney disease: Secondary | ICD-10-CM | POA: Diagnosis not present

## 2020-01-16 DIAGNOSIS — N182 Chronic kidney disease, stage 2 (mild): Secondary | ICD-10-CM | POA: Diagnosis not present

## 2020-01-16 DIAGNOSIS — E876 Hypokalemia: Secondary | ICD-10-CM | POA: Diagnosis not present

## 2020-01-16 DIAGNOSIS — S065X0D Traumatic subdural hemorrhage without loss of consciousness, subsequent encounter: Secondary | ICD-10-CM | POA: Diagnosis not present

## 2020-01-16 DIAGNOSIS — G9341 Metabolic encephalopathy: Secondary | ICD-10-CM | POA: Diagnosis not present

## 2020-01-16 DIAGNOSIS — R1310 Dysphagia, unspecified: Secondary | ICD-10-CM | POA: Diagnosis not present

## 2020-01-16 DIAGNOSIS — E1122 Type 2 diabetes mellitus with diabetic chronic kidney disease: Secondary | ICD-10-CM | POA: Diagnosis not present

## 2020-02-08 DIAGNOSIS — L570 Actinic keratosis: Secondary | ICD-10-CM | POA: Diagnosis not present

## 2020-02-08 DIAGNOSIS — D485 Neoplasm of uncertain behavior of skin: Secondary | ICD-10-CM | POA: Diagnosis not present

## 2020-02-09 ENCOUNTER — Other Ambulatory Visit: Payer: Self-pay | Admitting: Family Medicine

## 2020-02-19 DIAGNOSIS — D0430 Carcinoma in situ of skin of unspecified part of face: Secondary | ICD-10-CM | POA: Diagnosis not present

## 2020-02-19 DIAGNOSIS — H02402 Unspecified ptosis of left eyelid: Secondary | ICD-10-CM | POA: Diagnosis not present

## 2020-02-24 DIAGNOSIS — R69 Illness, unspecified: Secondary | ICD-10-CM | POA: Diagnosis not present

## 2020-02-25 DIAGNOSIS — D099 Carcinoma in situ, unspecified: Secondary | ICD-10-CM | POA: Diagnosis not present

## 2020-03-03 DIAGNOSIS — H524 Presbyopia: Secondary | ICD-10-CM | POA: Diagnosis not present

## 2020-03-03 DIAGNOSIS — Z7984 Long term (current) use of oral hypoglycemic drugs: Secondary | ICD-10-CM | POA: Diagnosis not present

## 2020-03-03 DIAGNOSIS — E119 Type 2 diabetes mellitus without complications: Secondary | ICD-10-CM | POA: Diagnosis not present

## 2020-03-03 DIAGNOSIS — H2513 Age-related nuclear cataract, bilateral: Secondary | ICD-10-CM | POA: Diagnosis not present

## 2020-03-03 DIAGNOSIS — H5213 Myopia, bilateral: Secondary | ICD-10-CM | POA: Diagnosis not present

## 2020-03-09 ENCOUNTER — Other Ambulatory Visit: Payer: Self-pay

## 2020-03-09 ENCOUNTER — Emergency Department (HOSPITAL_BASED_OUTPATIENT_CLINIC_OR_DEPARTMENT_OTHER)
Admission: EM | Admit: 2020-03-09 | Discharge: 2020-03-09 | Disposition: A | Discharge status: Home / Self Care | Payer: Medicare HMO | Attending: Emergency Medicine | Admitting: Emergency Medicine

## 2020-03-09 ENCOUNTER — Encounter (HOSPITAL_BASED_OUTPATIENT_CLINIC_OR_DEPARTMENT_OTHER): Payer: Self-pay | Admitting: Emergency Medicine

## 2020-03-09 ENCOUNTER — Emergency Department (HOSPITAL_BASED_OUTPATIENT_CLINIC_OR_DEPARTMENT_OTHER): Payer: Medicare HMO

## 2020-03-09 DIAGNOSIS — S8002XA Contusion of left knee, initial encounter: Secondary | ICD-10-CM | POA: Diagnosis not present

## 2020-03-09 DIAGNOSIS — Z8673 Personal history of transient ischemic attack (TIA), and cerebral infarction without residual deficits: Secondary | ICD-10-CM | POA: Diagnosis not present

## 2020-03-09 DIAGNOSIS — Y999 Unspecified external cause status: Secondary | ICD-10-CM | POA: Insufficient documentation

## 2020-03-09 DIAGNOSIS — W182XXA Fall in (into) shower or empty bathtub, initial encounter: Secondary | ICD-10-CM | POA: Diagnosis not present

## 2020-03-09 DIAGNOSIS — S8992XA Unspecified injury of left lower leg, initial encounter: Secondary | ICD-10-CM | POA: Diagnosis not present

## 2020-03-09 DIAGNOSIS — E119 Type 2 diabetes mellitus without complications: Secondary | ICD-10-CM | POA: Insufficient documentation

## 2020-03-09 DIAGNOSIS — Y92002 Bathroom of unspecified non-institutional (private) residence single-family (private) house as the place of occurrence of the external cause: Secondary | ICD-10-CM | POA: Diagnosis not present

## 2020-03-09 DIAGNOSIS — Z87891 Personal history of nicotine dependence: Secondary | ICD-10-CM | POA: Diagnosis not present

## 2020-03-09 DIAGNOSIS — Y93E1 Activity, personal bathing and showering: Secondary | ICD-10-CM | POA: Insufficient documentation

## 2020-03-09 DIAGNOSIS — I10 Essential (primary) hypertension: Secondary | ICD-10-CM | POA: Diagnosis not present

## 2020-03-09 IMAGING — DX DG KNEE AP/LAT W/ SUNRISE*L*
2 series · 2 of 2 positions shown · non-contrast
Comparison: None.

CLINICAL DATA: Fall

EXAM:
LEFT KNEE 3 VIEWS

[knee ap]
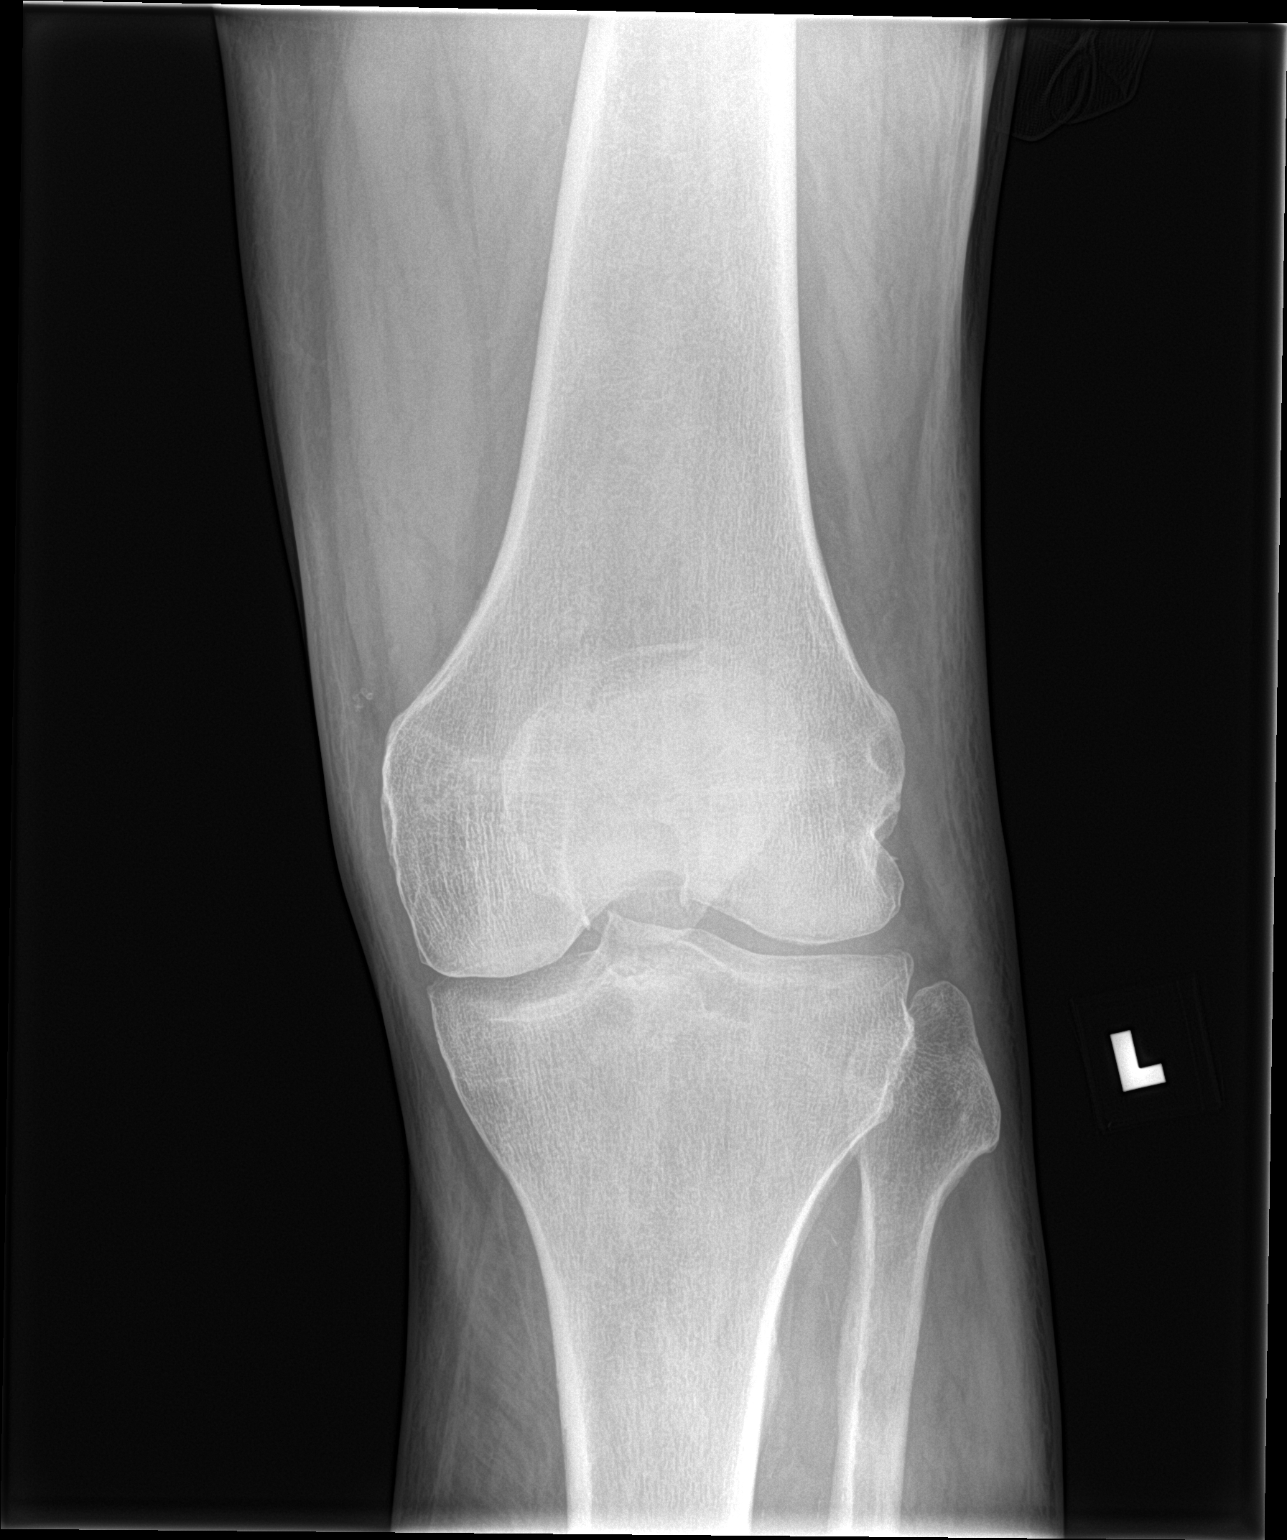

[knee lat]
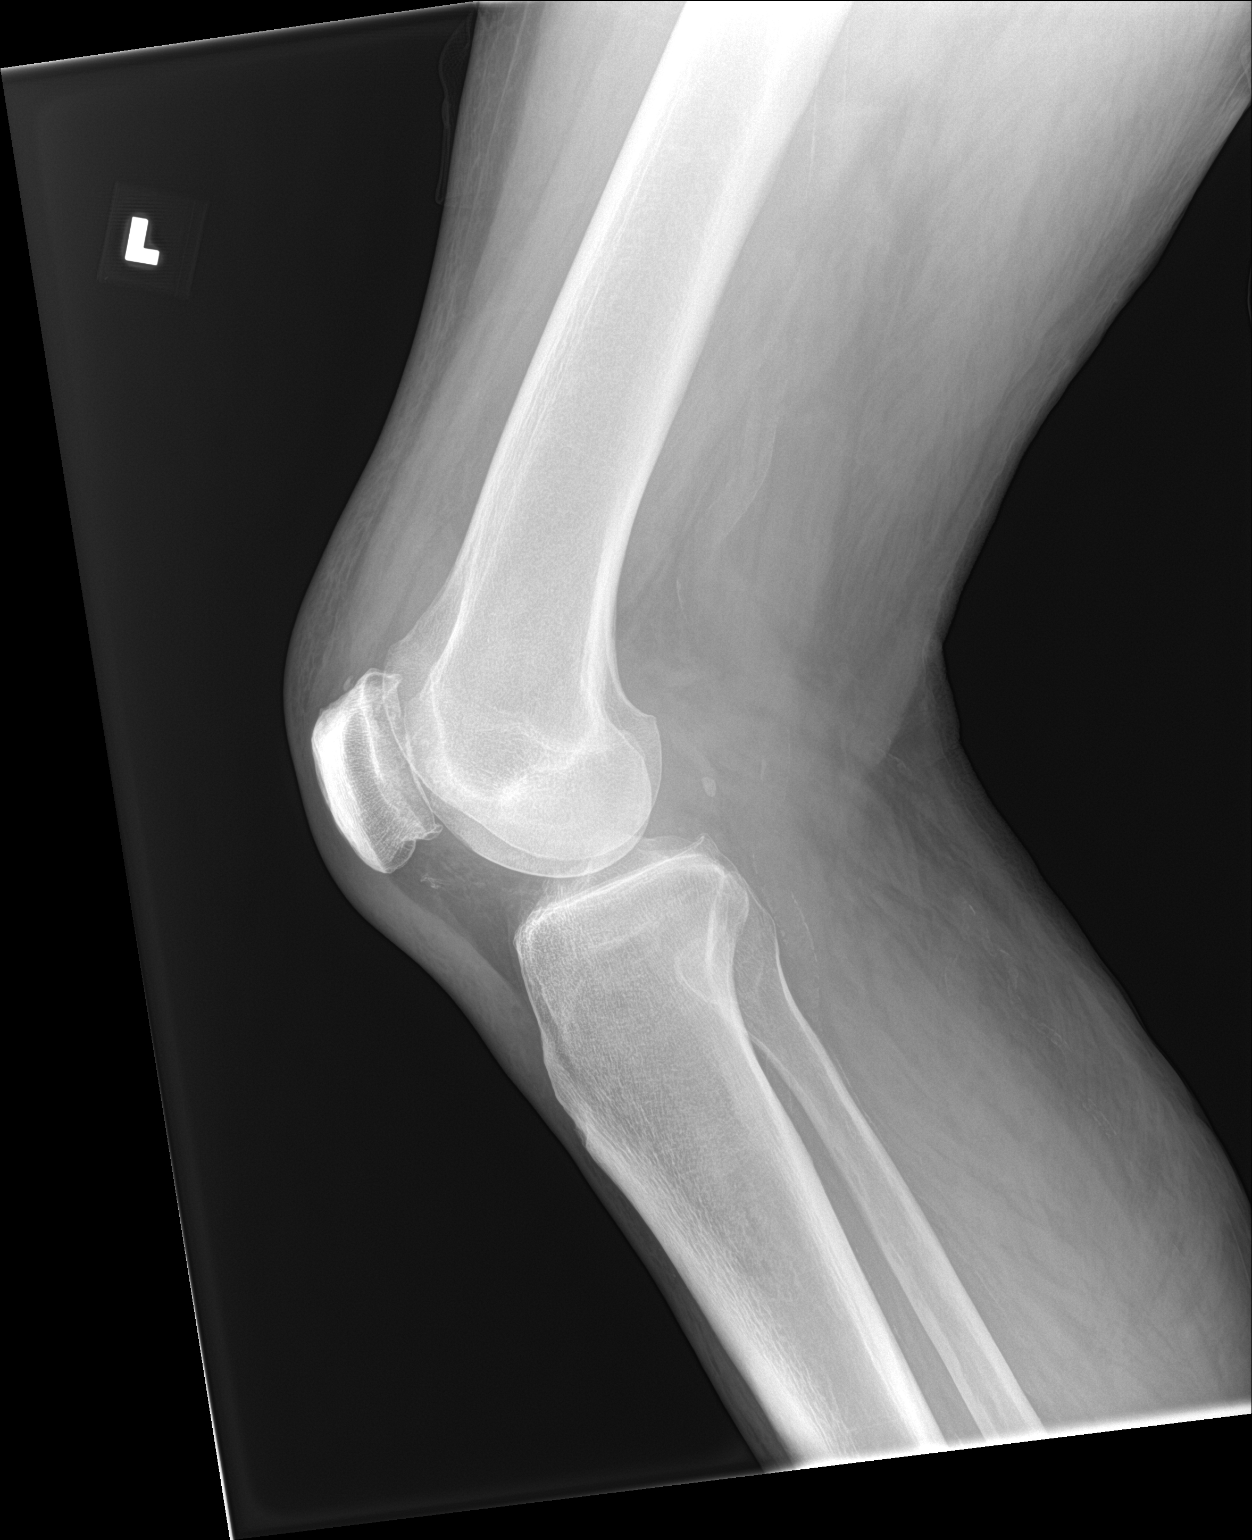

[2 of 2 positions shown; findings below may reference images not displayed]

FINDINGS: No fracture or dislocation is seen.

Mild degenerative changes with sharpening of the tibial spines and
patellofemoral osteophytosis.

No definite suprapatellar knee joint effusion.
IMPRESSION: No fracture or dislocation is seen.

Mild degenerative changes.

## 2020-03-09 MED ORDER — ACETAMINOPHEN 500 MG PO TABS
1000.0000 mg | ORAL_TABLET | Freq: Once | ORAL | Status: AC
  Administered 2020-03-09: 1000 mg via ORAL
  Filled 2020-03-09: qty 2

## 2020-03-09 NOTE — ED Provider Notes (Signed)
[facility] EMERGENCY DEPARTMENT Provider Note  CSN: [id number] Arrival date & time: 03/09/20 [id number]  Chief Complaint(s) Knee Injury  HPI Tommy Green is a 74 y.o. male   HPI CC: left knee pain  Onset/Duration: 10 hrs Timing: constant Location: left knee Quality: aching Severity: modrate Modifying Factors:  Improved by: immobility  Worsened by: ambulation, ROM, and palpation Associated Signs/Symptoms:  Pertinent (+): swelling  Pertinent (-): weakness, numbness, wound, redness. Context: patient slipped in the shower and hit left knee of side of tub. Did not fall completely. Denies any other injuries from incident  Past Medical History Past Medical History:  Diagnosis Date  . Anemia   . Essential hypertension 07/18/2017  . History of rheumatic fever   . History of stroke 07/18/2017  . Hypertriglyceridemia 07/18/2017  . Type 2 diabetes mellitus with diabetic neuropathy, unspecified ([place]) 07/18/2017   Patient Active Problem List   Diagnosis Date Noted  . Acute kidney injury superimposed on chronic kidney disease ([place])   . Respiratory failure ([place])   . Acute encephalopathy   . Hypernatremia   . Dyspnea   . Acute respiratory failure with hypoxemia ([place])   . SDH (subdural hematoma) ([place]) 10/24/2019  . Chronic pain of right knee 10/03/2019  . Cyclic vomiting syndrome 08/06/2019  . Neuropathy 12/25/2018  . Bilateral hip pain 08/24/2018  . AK (actinic keratosis) 08/24/2018  . Neoplasm of uncertain behavior 0000000  . 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 Tendinopathy of right gluteus medius 07/13/2018  . Tendinopathy of left gluteus medius 07/13/2018  . Squamous cell carcinoma in situ (SCCIS) of skin 03/24/2018  . Skin lesion 03/17/2018  . Essential hypertension 07/18/2017  . Hyperlipidemia associated with type 2 diabetes mellitus ([place]) 07/18/2017  . Type 2 diabetes mellitus with diabetic neuropathy, unspecified ([place]) 07/18/2017  . Heart murmur 07/18/2017  .  Hypertriglyceridemia 07/18/2017  . History of stroke 07/18/2017  . Pain in joint, ankle and foot 02/06/2013   Home Medication(s) Prior to Admission medications   Medication Sig Start Date End Date Taking? Authorizing Provider  carvedilol (COREG) 12.5 MG tablet TAKE 1 TABLET TWICE DAILY  WITH MEALS 12/18/19   Wendling, Crosby Oyster, DO  docusate sodium (COLACE) 100 MG capsule Take 1 capsule (100 mg total) by mouth daily as needed. 11/16/19   Pokhrel, Corrie Mckusick, MD  fenofibrate micronized (LOFIBRA) 200 MG capsule TAKE 1 CAPSULE DAILY BEFOREBREAKFAST Patient taking differently: Take 200 mg by mouth daily before breakfast.  02/21/19   Wendling, Crosby Oyster, DO  gabapentin (NEURONTIN) 600 MG tablet TAKE 1 TABLET 3 TIMES A DAY Patient taking differently: Take 600 mg by mouth 3 (three) times daily.  08/06/19   [name], DO  hydrochlorothiazide (HYDRODIURIL) 25 MG tablet TAKE 1 TABLET DAILY 02/11/20   [name], DO  insulin aspart (NOVOLOG) 100 UNIT/ML injection Inject 0-20 Units into the skin 4 (four) times daily -  before meals and at bedtime. 11/16/19   Pokhrel, Corrie Mckusick, MD  insulin degludec (TRESIBA) 100 UNIT/ML SOPN FlexTouch Pen Inject 0.6 mLs (60 Units total) into the skin at bedtime. 09/26/19   [name], DO  lidocaine (XYLOCAINE) 2 % solution Use as directed 10 mLs in the mouth or throat every 4 (four) hours as needed for mouth pain. Gargle, do not swallow. 12/05/19   [name], DO  lisinopril (ZESTRIL) 40 MG tablet TAKE 1 TABLET DAILY Patient taking differently: Take 40 mg by mouth daily.  02/21/19   [name], DO  lovastatin (MEVACOR) 10 MG tablet Take 1 tablet (10 mg total) by mouth at bedtime. 10/23/19   [name], DO  metFORMIN (GLUCOPHAGE) 500 MG tablet Take 500 mg by mouth 2 (two) times daily with a meal.    [provider]  Multiple Vitamins-Minerals (MULTIVITAMIN WITH MINERALS) tablet Take 1 tablet by mouth  daily.    [provider]  potassium chloride (KLOR-CON) 10 MEQ tablet Take 1 tablet (10 mEq total) by mouth daily for 10 days. 11/16/19 11/26/19  Pokhrel, Corrie Mckusick, MD  predniSONE (DELTASONE) 20 MG tablet Take 1 tablet (20 mg total) by mouth daily. 01/14/20   Hayden Rasmussen, MD  thiamine 100 MG tablet Take 1 tablet (100 mg total) by mouth daily. 11/16/19   Pokhrel, Corrie Mckusick, MD  traZODone (DESYREL) 50 MG tablet Take 0.5-1 tablets (25-50 mg total) by mouth at bedtime as needed for sleep. 12/05/19   [name], DO  vitamin B-12 (CYANOCOBALAMIN) 1000 MCG tablet Take 1,000 mcg by mouth daily.    [provider]                                                                                                                                    Past Surgical History Past Surgical History:  Procedure Laterality Date  . CRANIOTOMY Left 10/25/2019   Procedure: CRANIOTOMY HEMATOMA EVACUATION SUBDURAL;  Surgeon: Vallarie Mare, MD;  Location: [facility];  Service: Neurosurgery;  Laterality: Left;  . FOOT CAPSULOTOMY Left 07/25/2008   Mid Foot #2 MPJ  . Hammertoe Repair Left 07/25/2008   #2 toe  . SPINAL FUSION    . TARSAL TUNNEL RELEASE Left 07/25/2008   Family History Family History  Problem Relation Age of Onset  . Hyperlipidemia Mother   . Hyperlipidemia Father   . Cancer Neg Hx     Social History Social History   Tobacco Use  . Smoking status: Former Smoker    Types: Cigarettes  . Smokeless tobacco: Never Used  . Tobacco comment: Quit in 1973  Substance Use Topics  . Alcohol use: No  . Drug use: No   Allergies Influenza vaccines, Aspirin, Atorvastatin, Canagliflozin, and Statins  Review of Systems Review of Systems All other systems are reviewed and are negative for acute change except as noted in the HPI  Physical Exam Vital Signs  I have reviewed the triage vital signs BP (!) 146/64 (BP Location: Right Arm)   Pulse 78   Temp 98.2 F (36.8 C) (Oral)    Resp 20   Ht 5\' 11"  (1.803 m)   Wt 115.7 kg   SpO2 97%   BMI 35.57 kg/m   Physical Exam Vitals reviewed.  Constitutional:      General: He is not in acute distress.    Appearance: He is well-developed. He is not diaphoretic.  HENT:     Head: Normocephalic and atraumatic.     Jaw: No trismus.  Right Ear: External ear normal.     Left Ear: External ear normal.     Nose: Nose normal.  Eyes:     General: No scleral icterus.    Conjunctiva/sclera: Conjunctivae normal.  Neck:     Trachea: Phonation normal.  Cardiovascular:     Rate and Rhythm: Normal rate and regular rhythm.  Pulmonary:     Effort: Pulmonary effort is normal. No respiratory distress.     Breath sounds: No stridor.  Abdominal:     General: There is no distension.  Musculoskeletal:     Cervical back: Normal range of motion.     Left knee: Swelling and bony tenderness present. Decreased range of motion. Normal pulse.  Neurological:     Mental Status: He is alert and oriented to person, place, and time.  Psychiatric:        Behavior: Behavior normal.     ED Results and Treatments Labs (all labs ordered are listed, but only abnormal results are displayed) Labs Reviewed - No data to display                                                                                                                       EKG  EKG Interpretation  Date/Time:    Ventricular Rate:    PR Interval:    QRS Duration:   QT Interval:    QTC Calculation:   R Axis:     Text Interpretation:        Radiology DG Knee AP/LAT W/Sunrise Left  Result Date: 03/09/2020 CLINICAL DATA:  Fall EXAM: LEFT KNEE 3 VIEWS COMPARISON:  None. FINDINGS: No fracture or dislocation is seen. Mild degenerative changes with sharpening of the tibial spines and patellofemoral osteophytosis. No definite suprapatellar knee joint effusion. IMPRESSION: No fracture or dislocation is seen. Mild degenerative changes. Electronically Signed   By: Julian Hy M.D.   On: 03/09/2020 04:27    Pertinent labs & imaging results that were available during my care of the patient were reviewed by me and considered in my medical decision making (see chart for details).  Medications Ordered in ED Medications  acetaminophen (TYLENOL) tablet 1,000 mg (1,000 mg Oral Given 03/09/20 0425)                                                                                                                                    Procedures Procedures  (  including critical care time)  Medical Decision Making / ED Course I have reviewed the nursing notes for this encounter and the patient's prior records (if available in EHR or on provided paperwork).   Tommy Green was evaluated in Emergency Department on 03/09/2020 for the symptoms described in the history of present illness. He was evaluated in the context of the global COVID-19 pandemic, which necessitated consideration that the patient might be at risk for infection with the SARS-CoV-2 virus that causes COVID-19. Institutional protocols and algorithms that pertain to the evaluation of patients at risk for COVID-19 are in a state of rapid change based on information released by regulatory bodies including the CDC and federal and state organizations. These policies and algorithms were followed during the patient's care in the ED.  Left knee trauma. Notable swelling and point tenderness concerning for patellar fracture. Plain film w/o fracture or dislocation. likely contusion vs occult hairline fracture. Given 1G of Tylenol and knee immobilizer. PCP follow up      Final Clinical Impression(s) / ED Diagnoses Final diagnoses:  Contusion of left knee, initial encounter   The patient appears reasonably screened and/or stabilized for discharge and I doubt any other medical condition or other [facility] requiring further screening, evaluation, or treatment in the ED at this time prior to discharge. Safe for discharge  with strict return precautions.  Disposition: Discharge  Condition: Good  I have discussed the results, Dx and Tx plan with the patient/family who expressed understanding and agree(s) with the plan. Discharge instructions discussed at length. The patient/family was given strict return precautions who verbalized understanding of the instructions. No further questions at time of discharge.    ED Discharge Orders    None      Follow Up: [name], DO [place] [address] [place] [address] [phone]  Schedule an appointment as soon as possible for a visit        This chart was dictated using voice recognition software.  Despite best efforts to proofread,  errors can occur which can change the documentation meaning.   Fatima Blank, MD 03/09/20 [phone]

## 2020-03-09 NOTE — ED Triage Notes (Signed)
Pt states he slipped and fell getting out of the tub 1700 yesterday and landed on L knee. C/o knee pain since, states he can bear weight but "barely". Pt to room via wheelchair. Pt also states joint pain, especially BIL hand pain (R>L) that is chronic and he is awaiting f/u in late June.

## 2020-03-09 NOTE — ED Notes (Signed)
XR in progress 

## 2020-03-10 ENCOUNTER — Other Ambulatory Visit: Payer: Self-pay | Admitting: Family Medicine

## 2020-03-10 MED ORDER — GABAPENTIN 600 MG PO TABS: 600.0000 mg | ORAL_TABLET | Freq: Three times a day (TID) | ORAL | 0 refills | Status: DC

## 2020-03-27 DIAGNOSIS — R011 Cardiac murmur, unspecified: Secondary | ICD-10-CM | POA: Diagnosis not present

## 2020-03-27 DIAGNOSIS — Z85828 Personal history of other malignant neoplasm of skin: Secondary | ICD-10-CM | POA: Diagnosis not present

## 2020-03-27 DIAGNOSIS — Z887 Allergy status to serum and vaccine status: Secondary | ICD-10-CM | POA: Diagnosis not present

## 2020-03-27 DIAGNOSIS — M1990 Unspecified osteoarthritis, unspecified site: Secondary | ICD-10-CM | POA: Diagnosis not present

## 2020-03-27 DIAGNOSIS — Z87891 Personal history of nicotine dependence: Secondary | ICD-10-CM | POA: Diagnosis not present

## 2020-03-27 DIAGNOSIS — Z8673 Personal history of transient ischemic attack (TIA), and cerebral infarction without residual deficits: Secondary | ICD-10-CM | POA: Diagnosis not present

## 2020-03-27 DIAGNOSIS — E669 Obesity, unspecified: Secondary | ICD-10-CM | POA: Diagnosis not present

## 2020-03-27 DIAGNOSIS — E119 Type 2 diabetes mellitus without complications: Secondary | ICD-10-CM | POA: Diagnosis not present

## 2020-03-27 DIAGNOSIS — Z6834 Body mass index (BMI) 34.0-34.9, adult: Secondary | ICD-10-CM | POA: Diagnosis not present

## 2020-03-27 DIAGNOSIS — H02402 Unspecified ptosis of left eyelid: Secondary | ICD-10-CM | POA: Diagnosis not present

## 2020-03-27 DIAGNOSIS — I10 Essential (primary) hypertension: Secondary | ICD-10-CM | POA: Diagnosis not present

## 2020-03-27 DIAGNOSIS — I69354 Hemiplegia and hemiparesis following cerebral infarction affecting left non-dominant side: Secondary | ICD-10-CM | POA: Diagnosis not present

## 2020-03-30 ENCOUNTER — Emergency Department (HOSPITAL_BASED_OUTPATIENT_CLINIC_OR_DEPARTMENT_OTHER): Payer: Medicare HMO

## 2020-03-30 ENCOUNTER — Other Ambulatory Visit: Payer: Self-pay

## 2020-03-30 ENCOUNTER — Encounter (HOSPITAL_BASED_OUTPATIENT_CLINIC_OR_DEPARTMENT_OTHER): Payer: Self-pay

## 2020-03-30 ENCOUNTER — Emergency Department (HOSPITAL_BASED_OUTPATIENT_CLINIC_OR_DEPARTMENT_OTHER)
Admission: EM | Admit: 2020-03-30 | Discharge: 2020-03-30 | Disposition: A | Discharge status: Home / Self Care | Payer: Medicare HMO | Attending: Emergency Medicine | Admitting: Emergency Medicine

## 2020-03-30 DIAGNOSIS — M169 Osteoarthritis of hip, unspecified: Secondary | ICD-10-CM | POA: Insufficient documentation

## 2020-03-30 DIAGNOSIS — D649 Anemia, unspecified: Secondary | ICD-10-CM | POA: Insufficient documentation

## 2020-03-30 DIAGNOSIS — Z888 Allergy status to other drugs, medicaments and biological substances status: Secondary | ICD-10-CM | POA: Insufficient documentation

## 2020-03-30 DIAGNOSIS — Z8673 Personal history of transient ischemic attack (TIA), and cerebral infarction without residual deficits: Secondary | ICD-10-CM | POA: Diagnosis not present

## 2020-03-30 DIAGNOSIS — I10 Essential (primary) hypertension: Secondary | ICD-10-CM | POA: Diagnosis not present

## 2020-03-30 DIAGNOSIS — E781 Pure hyperglyceridemia: Secondary | ICD-10-CM | POA: Insufficient documentation

## 2020-03-30 DIAGNOSIS — E1140 Type 2 diabetes mellitus with diabetic neuropathy, unspecified: Secondary | ICD-10-CM | POA: Diagnosis not present

## 2020-03-30 DIAGNOSIS — M1611 Unilateral primary osteoarthritis, right hip: Secondary | ICD-10-CM | POA: Diagnosis not present

## 2020-03-30 DIAGNOSIS — Z87891 Personal history of nicotine dependence: Secondary | ICD-10-CM | POA: Diagnosis not present

## 2020-03-30 DIAGNOSIS — M160 Bilateral primary osteoarthritis of hip: Secondary | ICD-10-CM

## 2020-03-30 DIAGNOSIS — M1612 Unilateral primary osteoarthritis, left hip: Secondary | ICD-10-CM | POA: Diagnosis not present

## 2020-03-30 DIAGNOSIS — M545 Low back pain: Secondary | ICD-10-CM | POA: Diagnosis not present

## 2020-03-30 DIAGNOSIS — M25552 Pain in left hip: Secondary | ICD-10-CM | POA: Diagnosis present

## 2020-03-30 IMAGING — CR DG LUMBAR SPINE COMPLETE 4+V
5 series · 5 of 5 positions shown · non-contrast
Comparison: None.

CLINICAL DATA: Low back pain

EXAM:
LUMBAR SPINE - COMPLETE 4+ VIEW

[t l-spine lat]
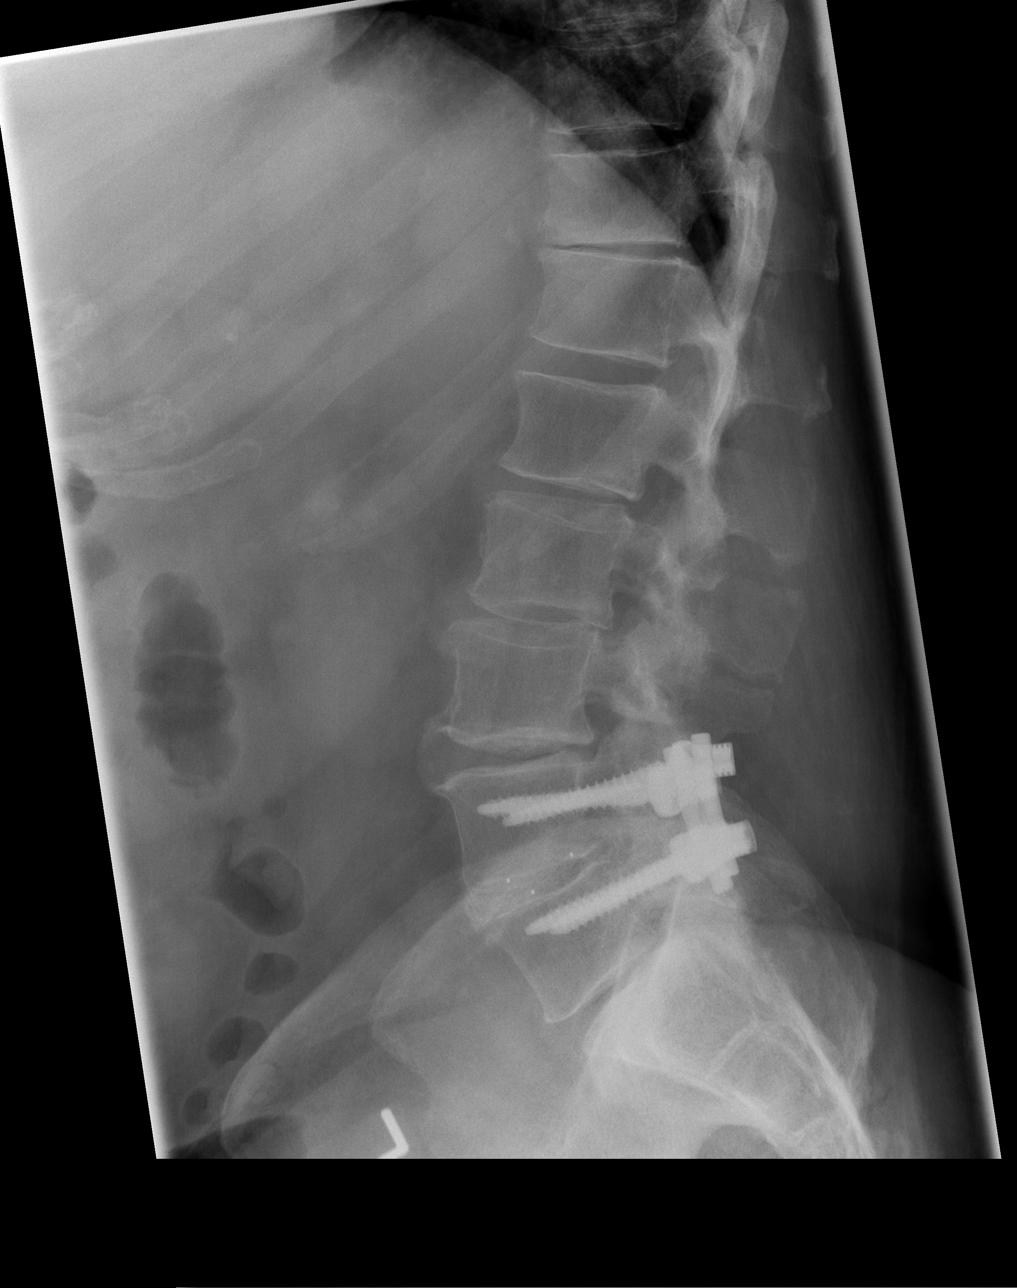

[t l-spine l5-s1 spot]
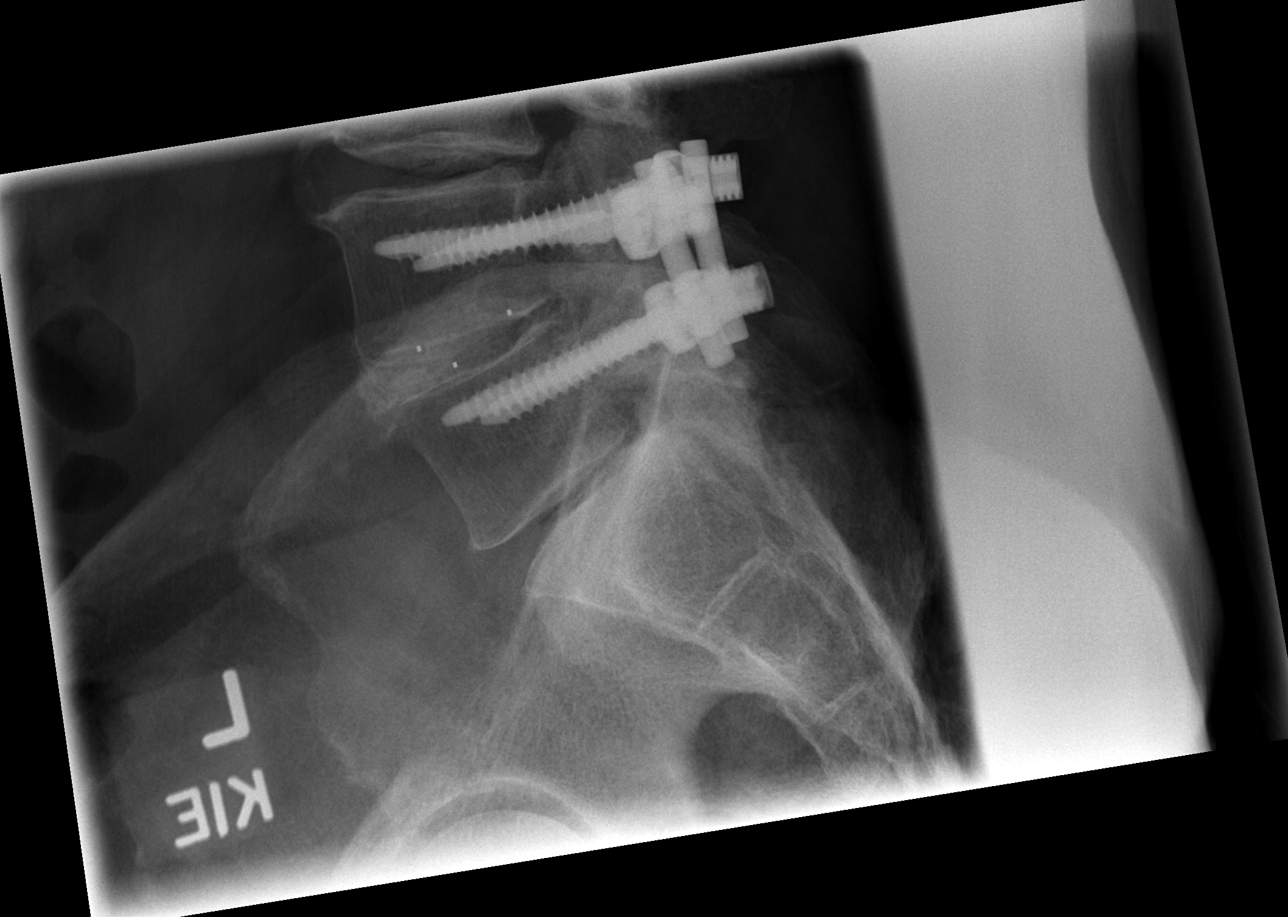

[t l-spine oblique exposure (1 of 2)]
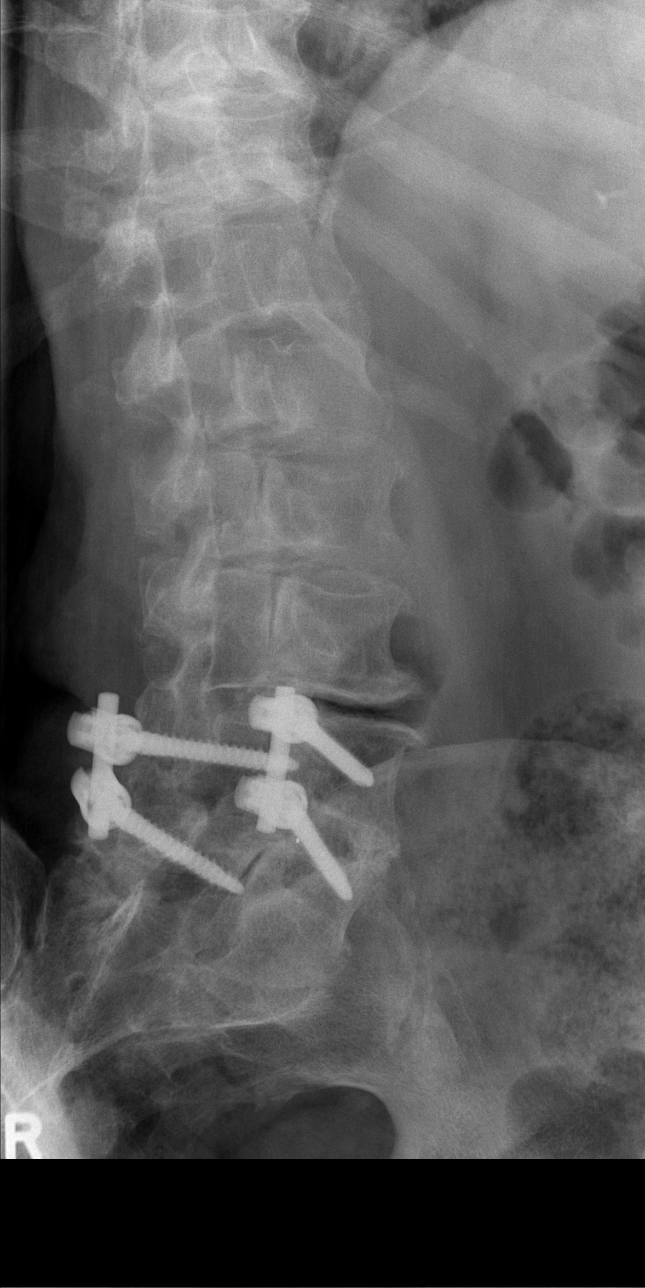

[t l-spine a.p.]
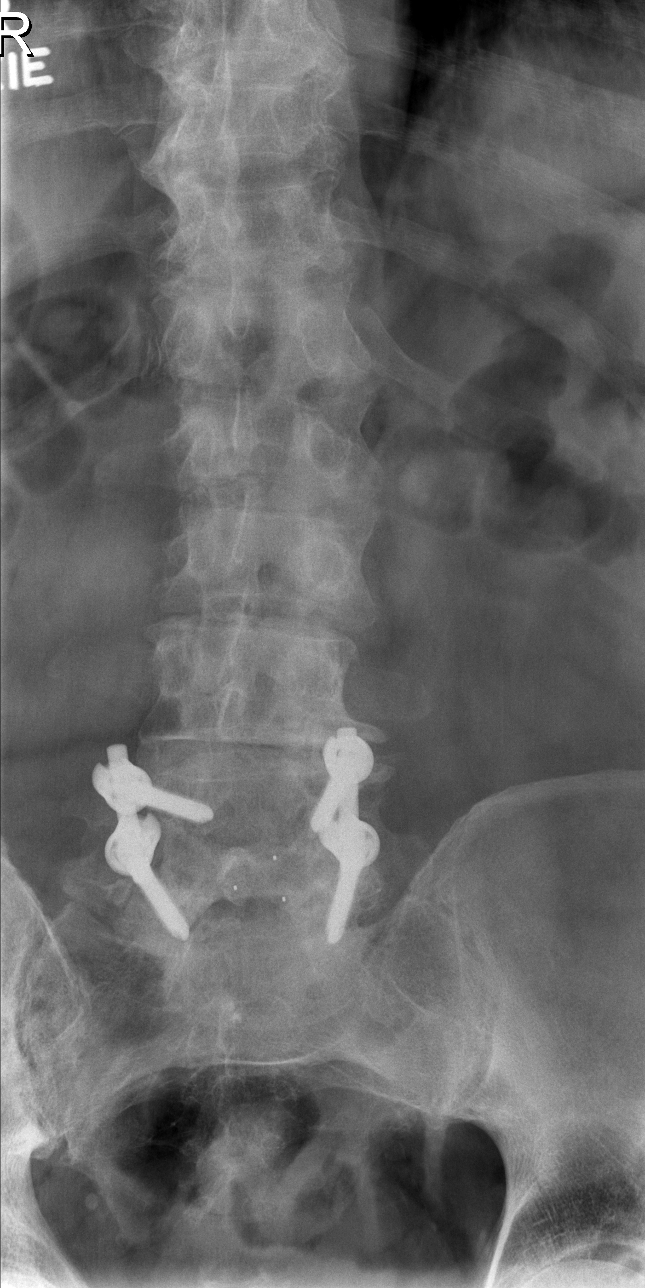

[t l-spine oblique exposure (2 of 2)]
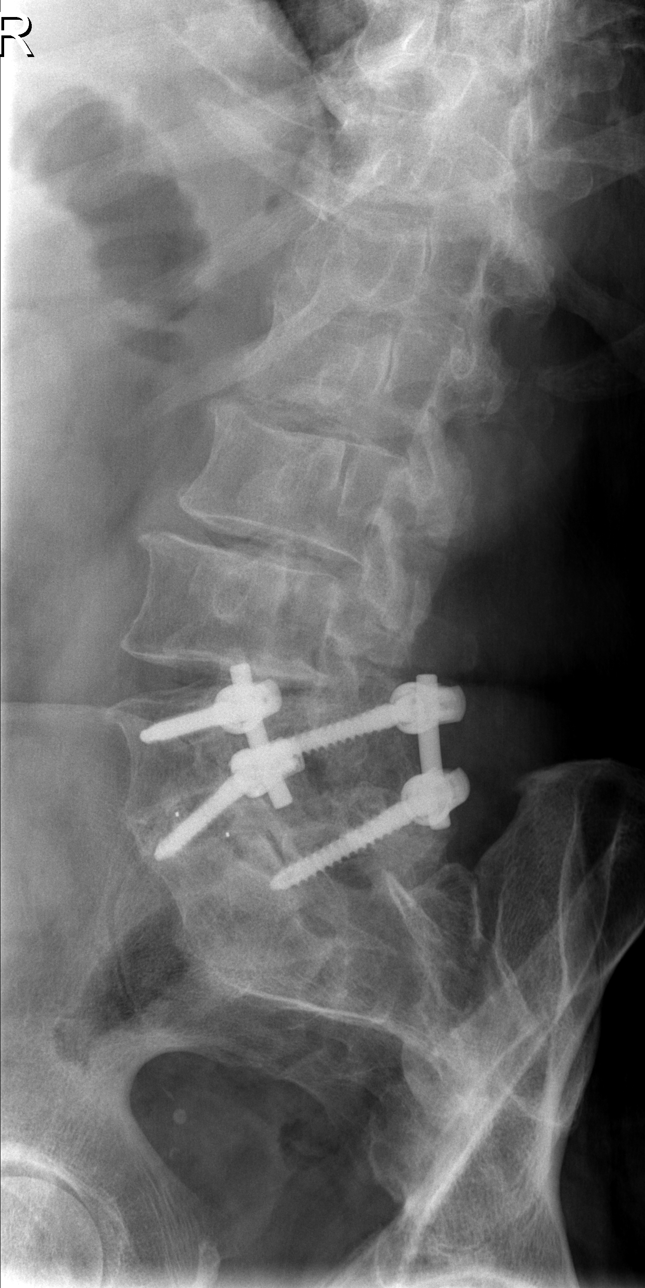

[5 of 5 positions shown; findings below may reference images not displayed]

FINDINGS: Frontal, lateral, spot lumbosacral lateral, and bilateral oblique
views were obtained. There are 5 non-rib-bearing lumbar type
vertebral bodies. There is posterior screw and plate fixation at L4
and L5 with support hardware intact. There is a disc spacer at L4-5.
No fracture evident. There is 6 mm of anterolisthesis of L5 on S1.
No other spondylolisthesis is evident. There is moderate disc space
narrowing at all levels. There is facet osteoarthritic change at
L4-5 and L5-S1 bilaterally.
IMPRESSION: Postoperative change at L4 and L5 with support hardware intact. No
fracture. Spondylolisthesis at L5-S1 present. No other
spondylolisthesis. Multifocal osteoarthritic change noted.

## 2020-03-30 IMAGING — CR DG HIP (WITH OR WITHOUT PELVIS) 2-3V*L*
3 series · 3 of 3 positions shown · non-contrast
Comparison: [DATE]

CLINICAL DATA: Atraumatic left hip pain

EXAM:
DG HIP (WITH OR WITHOUT PELVIS) 2-3V LEFT

[t hip frog leg left]
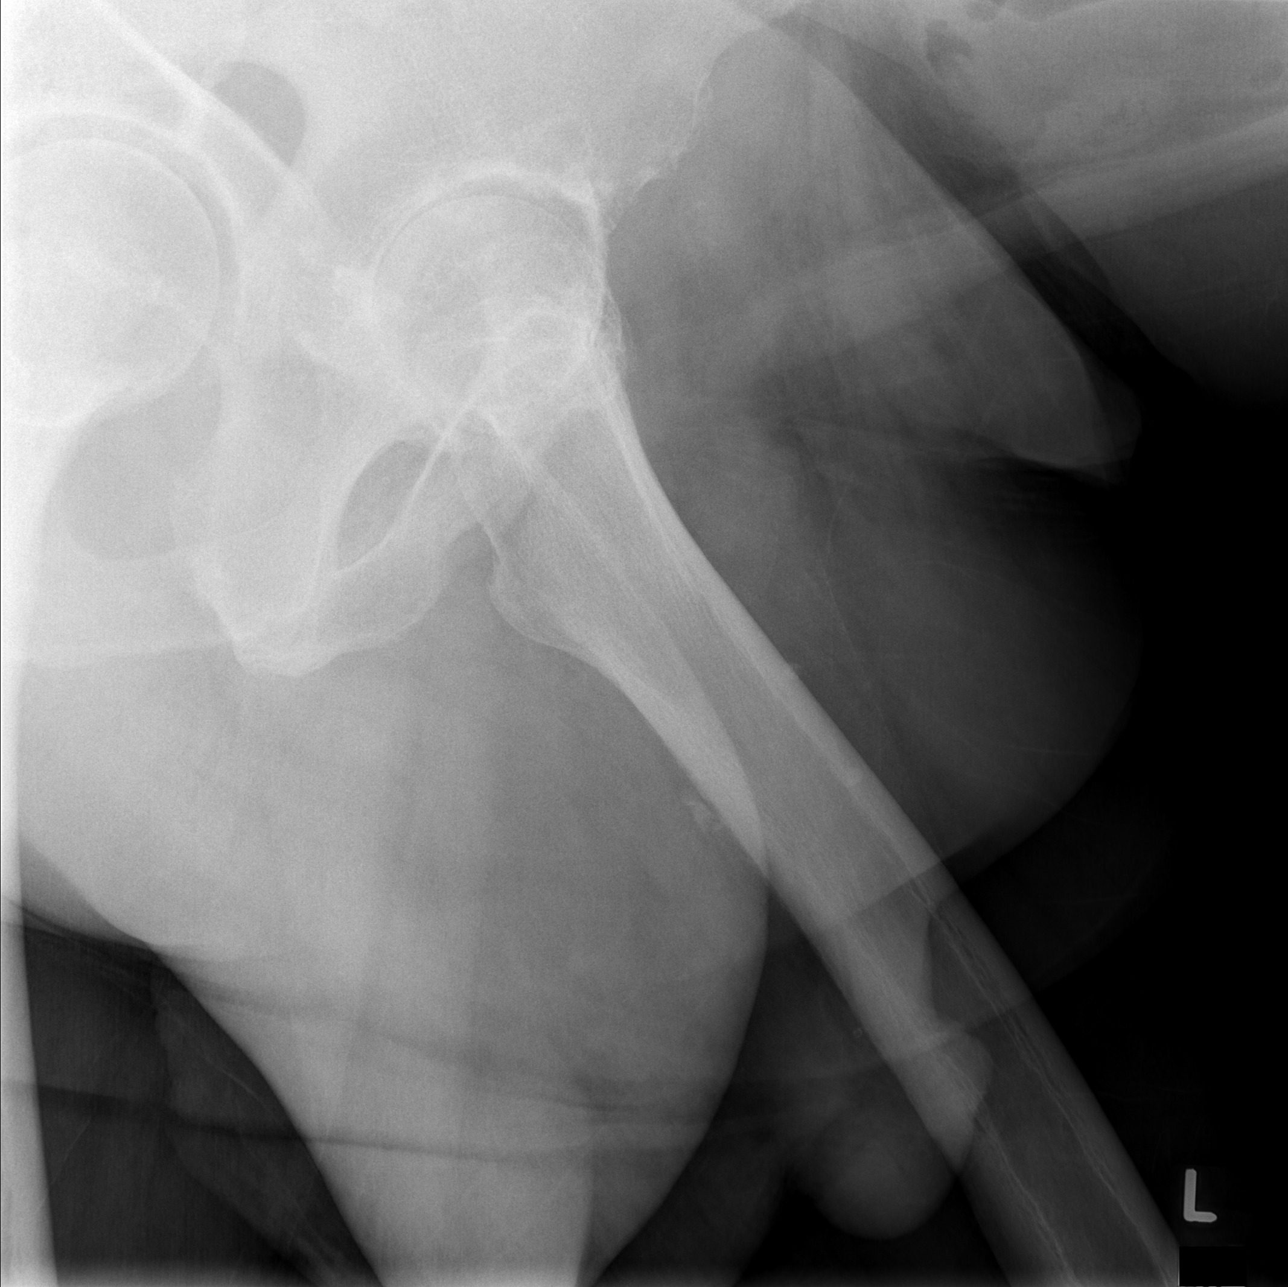

[t pelvis a.p.]
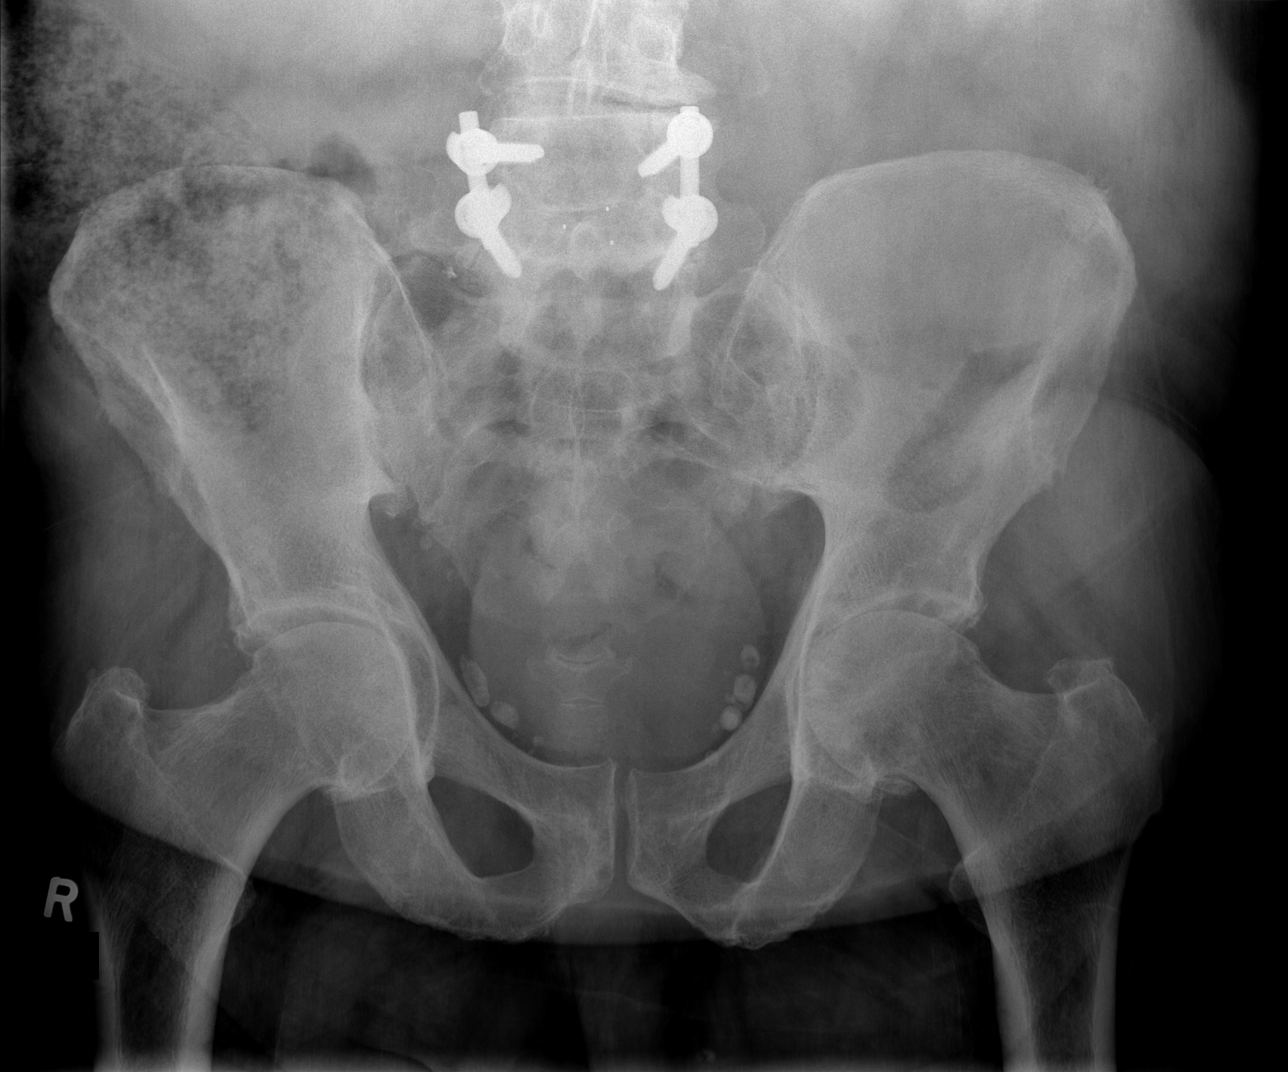

[t hip ap left]
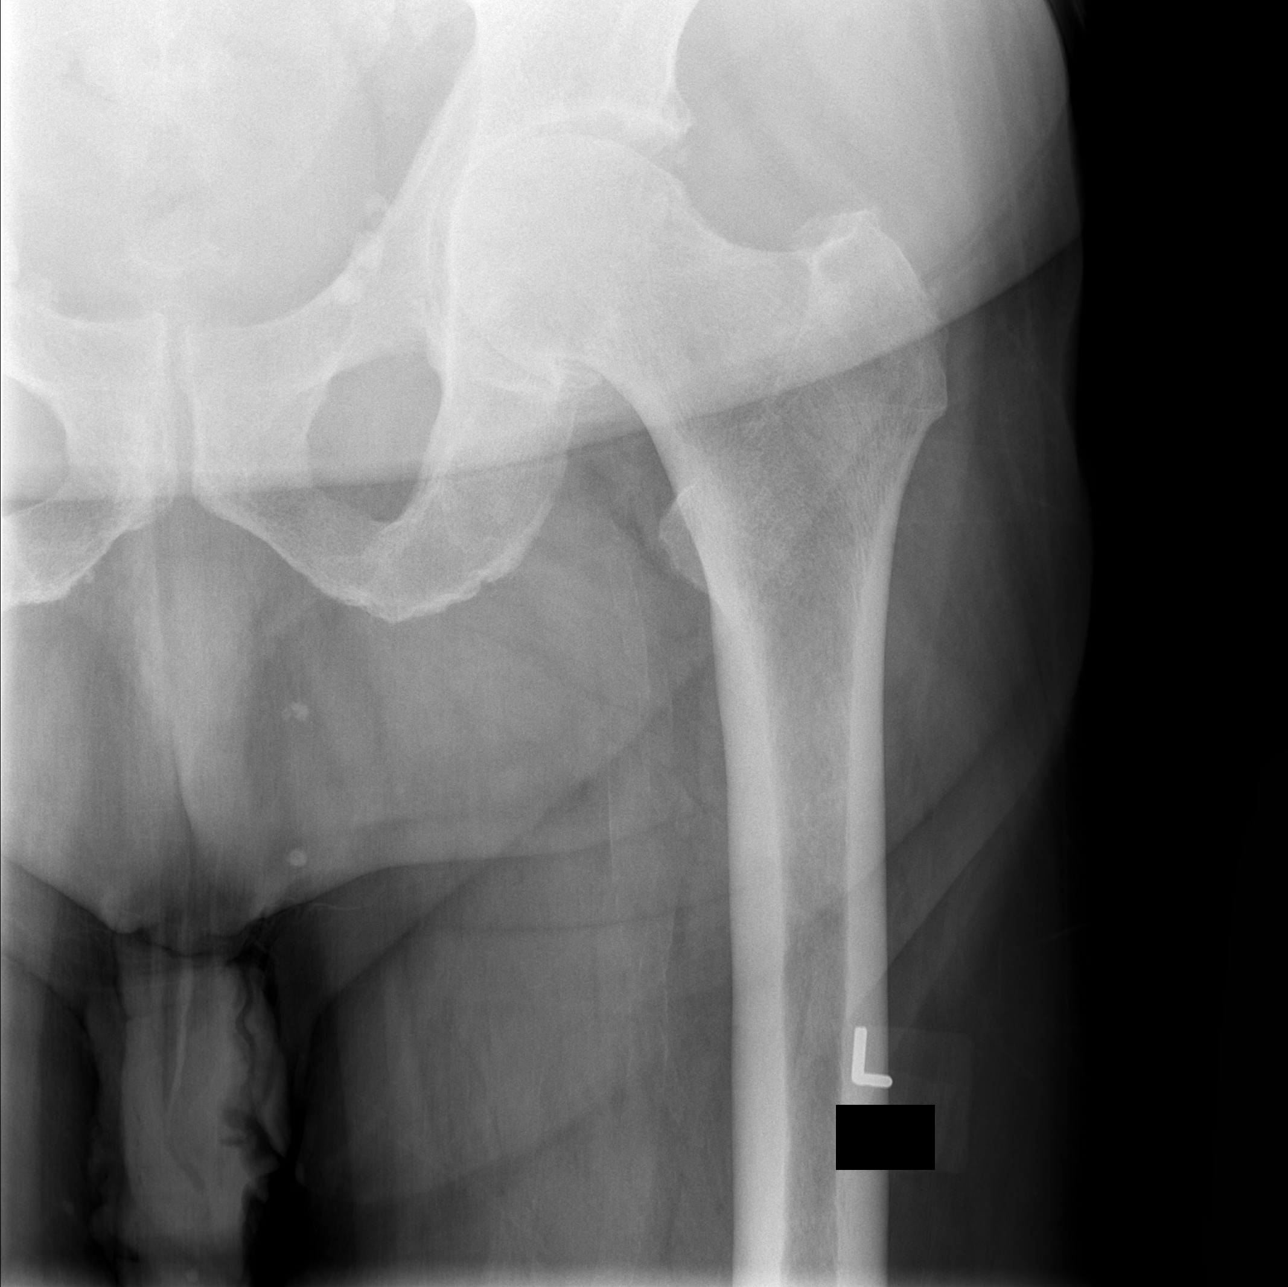

[3 of 3 positions shown; findings below may reference images not displayed]

FINDINGS: Bilateral axial hip space narrowing with marginal osteophytes. No
acute fracture or erosion. There has been L4-5 PLIF with L3-4
advanced left-sided degenerative disc narrowing.
IMPRESSION: 1. No acute finding.
2. Bilateral hip osteoarthritis.

## 2020-03-30 MED ORDER — HYDROMORPHONE HCL 1 MG/ML IJ SOLN
1.0000 mg | Freq: Once | INTRAMUSCULAR | Status: AC
  Administered 2020-03-30: 1 mg via INTRAMUSCULAR
  Filled 2020-03-30: qty 1

## 2020-03-30 MED ORDER — PREDNISONE 20 MG PO TABS
20.0000 mg | ORAL_TABLET | Freq: Two times a day (BID) | ORAL | 0 refills | Status: AC
Start: 2020-03-30 — End: 2020-04-04

## 2020-03-30 MED ORDER — PREDNISONE 20 MG PO TABS
20.0000 mg | ORAL_TABLET | Freq: Two times a day (BID) | ORAL | 0 refills | Status: DC
Start: 2020-03-30 — End: 2020-03-30

## 2020-03-30 NOTE — ED Provider Notes (Signed)
[facility] EMERGENCY DEPARTMENT Provider Note   CSN: [id number] Arrival date & time: 03/30/20  0844     History Chief Complaint  Patient presents with  . Leg Pain    Tommy Green is a 74 y.o. male.  HPI   Patient presents to the ED for evaluation of left hip pain.  Patient states it is primarily left hip but does radiate down somewhat lower.  He denies any trouble with any back pain.  He is not having any numbness or weakness.  The pain increased over the last couple of days.  He denies any recent falls or injuries.  No fevers or chills.  Patient has been having issues with joint pain including his wrist and is scheduled to see a rheumatologist later this month.  In the past he has taken steroids with some improvement.  Past Medical History:  Diagnosis Date  . Anemia   . Essential hypertension 07/18/2017  . History of rheumatic fever   . History of stroke 07/18/2017  . Hypertriglyceridemia 07/18/2017  . Type 2 diabetes mellitus with diabetic neuropathy, unspecified ([place]) 07/18/2017    Patient Active Problem List   Diagnosis Date Noted  . Acute kidney injury superimposed on chronic kidney disease ([place])   . Respiratory failure ([place])   . Acute encephalopathy   . Hypernatremia   . Dyspnea   . Acute respiratory failure with hypoxemia ([place])   . SDH (subdural hematoma) ([place]) 10/24/2019  . Chronic pain of right knee 10/03/2019  . Cyclic vomiting syndrome 08/06/2019  . Neuropathy 12/25/2018  . Bilateral hip pain 08/24/2018  . AK (actinic keratosis) 08/24/2018  . Neoplasm of uncertain behavior 52/77/8242  . 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 Tendinopathy of right gluteus medius 07/13/2018  . Tendinopathy of left gluteus medius 07/13/2018  . Squamous cell carcinoma in situ (SCCIS) of skin 03/24/2018  . Skin lesion 03/17/2018  . Essential hypertension 07/18/2017  . Hyperlipidemia associated with type 2 diabetes mellitus ([place]) 07/18/2017  . Type 2 diabetes mellitus  with diabetic neuropathy, unspecified ([place]) 07/18/2017  . Heart murmur 07/18/2017  . Hypertriglyceridemia 07/18/2017  . History of stroke 07/18/2017  . Pain in joint, ankle and foot 02/06/2013    Past Surgical History:  Procedure Laterality Date  . CRANIOTOMY Left 10/25/2019   Procedure: CRANIOTOMY HEMATOMA EVACUATION SUBDURAL;  Surgeon: Vallarie Mare, MD;  Location: [facility];  Service: Neurosurgery;  Laterality: Left;  . FOOT CAPSULOTOMY Left 07/25/2008   Mid Foot #2 MPJ  . Hammertoe Repair Left 07/25/2008   #2 toe  . SPINAL FUSION    . TARSAL TUNNEL RELEASE Left 07/25/2008       Family History  Problem Relation Age of Onset  . Hyperlipidemia Mother   . Hyperlipidemia Father   . Cancer Neg Hx     Social History   Tobacco Use  . Smoking status: Former Smoker    Types: Cigarettes  . Smokeless tobacco: Never Used  . Tobacco comment: Quit in 1973  Substance Use Topics  . Alcohol use: No  . Drug use: No    Home Medications Prior to Admission medications   Medication Sig Start Date End Date Taking? Authorizing Provider  carvedilol (COREG) 12.5 MG tablet TAKE 1 TABLET TWICE DAILY  WITH MEALS 12/18/19   Wendling, Crosby Oyster, DO  docusate sodium (COLACE) 100 MG capsule Take 1 capsule (100 mg total) by mouth daily as needed. 11/16/19   Pokhrel, Corrie Mckusick, MD  fenofibrate micronized (LOFIBRA) 200 MG capsule  TAKE 1 CAPSULE DAILY BEFOREBREAKFAST Patient taking differently: Take 200 mg by mouth daily before breakfast.  02/21/19   Wendling, Crosby Oyster, DO  gabapentin (NEURONTIN) 600 MG tablet Take 1 tablet (600 mg total) by mouth 3 (three) times daily. 03/10/20   [name], DO  hydrochlorothiazide (HYDRODIURIL) 25 MG tablet TAKE 1 TABLET DAILY 02/11/20   [name], DO  insulin aspart (NOVOLOG) 100 UNIT/ML injection Inject 0-20 Units into the skin 4 (four) times daily -  before meals and at bedtime. 11/16/19   Pokhrel, Corrie Mckusick, MD  insulin degludec (TRESIBA) 100  UNIT/ML SOPN FlexTouch Pen Inject 0.6 mLs (60 Units total) into the skin at bedtime. 09/26/19   [name], DO  lidocaine (XYLOCAINE) 2 % solution Use as directed 10 mLs in the mouth or throat every 4 (four) hours as needed for mouth pain. Gargle, do not swallow. 12/05/19   [name], DO  lisinopril (ZESTRIL) 40 MG tablet TAKE 1 TABLET DAILY Patient taking differently: Take 40 mg by mouth daily.  02/21/19   [name], DO  lovastatin (MEVACOR) 10 MG tablet Take 1 tablet (10 mg total) by mouth at bedtime. 10/23/19   [name], DO  metFORMIN (GLUCOPHAGE) 500 MG tablet Take 500 mg by mouth 2 (two) times daily with a meal.    [provider]  Multiple Vitamins-Minerals (MULTIVITAMIN WITH MINERALS) tablet Take 1 tablet by mouth daily.    [provider]  potassium chloride (KLOR-CON) 10 MEQ tablet Take 1 tablet (10 mEq total) by mouth daily for 10 days. 11/16/19 11/26/19  Pokhrel, Corrie Mckusick, MD  predniSONE (DELTASONE) 20 MG tablet Take 1 tablet (20 mg total) by mouth in the morning and at bedtime for 5 days. 03/30/20 04/04/20  Dorie Rank, MD  thiamine 100 MG tablet Take 1 tablet (100 mg total) by mouth daily. 11/16/19   Pokhrel, Corrie Mckusick, MD  traZODone (DESYREL) 50 MG tablet Take 0.5-1 tablets (25-50 mg total) by mouth at bedtime as needed for sleep. 12/05/19   [name], DO  vitamin B-12 (CYANOCOBALAMIN) 1000 MCG tablet Take 1,000 mcg by mouth daily.    [provider]    Allergies    Influenza vaccines, Aspirin, Atorvastatin, Canagliflozin, and Statins  Review of Systems   Review of Systems  All other systems reviewed and are negative.   Physical Exam Updated Vital Signs BP 130/60 (BP Location: Right Arm)   Pulse 85   Temp 98.7 F (37.1 C) (Oral)   Resp 20   Ht 1.803 m (5\' 11" )   Wt 117 kg   SpO2 95%   BMI 35.98 kg/m   Physical Exam Vitals and nursing note reviewed.  Constitutional:      General: He is not  in acute distress.    Appearance: He is well-developed.  HENT:     Head: Normocephalic and atraumatic.     Right Ear: External ear normal.     Left Ear: External ear normal.  Eyes:     General: No scleral icterus.       Right eye: No discharge.        Left eye: No discharge.     Conjunctiva/sclera: Conjunctivae normal.  Neck:     Trachea: No tracheal deviation.  Cardiovascular:     Rate and Rhythm: Normal rate.  Pulmonary:     Effort: Pulmonary effort is normal. No respiratory distress.     Breath sounds: No stridor.  Abdominal:  General: There is no distension.  Musculoskeletal:        General: Tenderness present. No swelling or deformity.     Cervical back: Neck supple.     Comments: No lumbar spine tenderness, tenderness palpation left hip, pain with range of motion, no surrounding erythema, no edema of the leg, neurovascular intact  Skin:    General: Skin is warm and dry.     Findings: No rash.  Neurological:     Mental Status: He is alert.     Cranial Nerves: Cranial nerve deficit: no gross deficits.     ED Results / Procedures / Treatments   Labs (all labs ordered are listed, but only abnormal results are displayed) Labs Reviewed - No data to display  EKG None  Radiology DG Lumbar Spine Complete  Result Date: 03/30/2020 CLINICAL DATA:  Low back pain EXAM: LUMBAR SPINE - COMPLETE 4+ VIEW COMPARISON:  None. FINDINGS: Frontal, lateral, spot lumbosacral lateral, and bilateral oblique views were obtained. There are 5 non-rib-bearing lumbar type vertebral bodies. There is posterior screw and plate fixation at L4 and L5 with support hardware intact. There is a disc spacer at L4-5. No fracture evident. There is 6 mm of anterolisthesis of L5 on S1. No other spondylolisthesis is evident. There is moderate disc space narrowing at all levels. There is facet osteoarthritic change at L4-5 and L5-S1 bilaterally. IMPRESSION: Postoperative change at L4 and L5 with support hardware  intact. No fracture. Spondylolisthesis at L5-S1 present. No other spondylolisthesis. Multifocal osteoarthritic change noted. Electronically Signed   By: Lowella Grip III M.D.   On: 03/30/2020 10:31   DG Hip Unilat W or Wo Pelvis 2-3 Views Left  Result Date: 03/30/2020 CLINICAL DATA:  Atraumatic left hip pain EXAM: DG HIP (WITH OR WITHOUT PELVIS) 2-3V LEFT COMPARISON:  10/24/2019 FINDINGS: Bilateral axial hip space narrowing with marginal osteophytes. No acute fracture or erosion. There has been L4-5 PLIF with L3-4 advanced left-sided degenerative disc narrowing. IMPRESSION: 1. No acute finding. 2. Bilateral hip osteoarthritis. Electronically Signed   By: Monte Fantasia M.D.   On: 03/30/2020 10:29    Procedures Procedures (including critical care time)  Medications Ordered in ED Medications  HYDROmorphone (DILAUDID) injection 1 mg (1 mg Intramuscular Given 03/30/20 0931)    ED Course  I have reviewed the triage vital signs and the nursing notes.  Pertinent labs & imaging results that were available during my care of the patient were reviewed by me and considered in my medical decision making (see chart for details).  Clinical Course as of Mar 30 1048  Sun Mar 30, 2020  1049 Symptoms have improved after pain meds.  X-ray findings reviewed.    [JK]    Clinical Course User Index [JK] Dorie Rank, MD   MDM Rules/Calculators/A&P                          Patient's x-rays are suggestive of osteoarthritis.  Symptoms are not clearly radicular in nature.  Patient does not have any acute neurovascular findings to suggest claudication or lumbar radiculopathy.  Patient states he has had improvement with steroids in the past.  Cautioned him on monitoring his blood sugar while taking the steroids as it will likely be higher than usual.  Will discharge home on a short course of steroids. Final Clinical Impression(s) / ED Diagnoses Final diagnoses:  Osteoarthritis of both hips, unspecified  osteoarthritis type    Rx / DC Orders ED  Discharge Orders         Ordered    predniSONE (DELTASONE) 20 MG tablet  2 times daily     Discontinue  Reprint     03/30/20 1048           Dorie Rank, MD 03/30/20 1049

## 2020-03-30 NOTE — ED Triage Notes (Signed)
Pt arrives with c/o pain to left hip with radiation into left leg X2 days. Pt has upcoming appointment with RA on June 28th.

## 2020-03-30 NOTE — Discharge Instructions (Signed)
Take medications as prescribed.  Make sure to monitor blood sugar while taking the steroids.  Follow-up with your doctor next week to see if your symptoms are improving.  Follow-up with the rheumatologist as planned

## 2020-04-02 DIAGNOSIS — E1165 Type 2 diabetes mellitus with hyperglycemia: Secondary | ICD-10-CM | POA: Diagnosis not present

## 2020-04-02 DIAGNOSIS — E876 Hypokalemia: Secondary | ICD-10-CM | POA: Diagnosis not present

## 2020-04-02 DIAGNOSIS — Z6837 Body mass index (BMI) 37.0-37.9, adult: Secondary | ICD-10-CM | POA: Diagnosis not present

## 2020-04-02 DIAGNOSIS — E782 Mixed hyperlipidemia: Secondary | ICD-10-CM | POA: Diagnosis not present
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
## 2020-04-14 ENCOUNTER — Other Ambulatory Visit: Payer: Self-pay

## 2020-04-14 ENCOUNTER — Ambulatory Visit: Payer: Self-pay

## 2020-04-14 ENCOUNTER — Ambulatory Visit: Payer: Medicare HMO | Admitting: Rheumatology

## 2020-04-14 ENCOUNTER — Encounter: Payer: Self-pay | Admitting: Rheumatology

## 2020-04-14 VITALS — BP 124/70 | HR 68 | Resp 18 | Ht 70.0 in | Wt 253.0 lb

## 2020-04-14 DIAGNOSIS — M5136 Other intervertebral disc degeneration, lumbar region: Secondary | ICD-10-CM

## 2020-04-14 DIAGNOSIS — M79642 Pain in left hand: Secondary | ICD-10-CM

## 2020-04-14 DIAGNOSIS — S065X9A Traumatic subdural hemorrhage with loss of consciousness of unspecified duration, initial encounter: Secondary | ICD-10-CM

## 2020-04-14 DIAGNOSIS — Z794 Long term (current) use of insulin: Secondary | ICD-10-CM

## 2020-04-14 DIAGNOSIS — M1712 Unilateral primary osteoarthritis, left knee: Secondary | ICD-10-CM | POA: Diagnosis not present

## 2020-04-14 DIAGNOSIS — S90112A Contusion of left great toe without damage to nail, initial encounter: Secondary | ICD-10-CM | POA: Diagnosis not present

## 2020-04-14 DIAGNOSIS — E785 Hyperlipidemia, unspecified: Secondary | ICD-10-CM

## 2020-04-14 DIAGNOSIS — E1169 Type 2 diabetes mellitus with other specified complication: Secondary | ICD-10-CM

## 2020-04-14 DIAGNOSIS — I10 Essential (primary) hypertension: Secondary | ICD-10-CM | POA: Diagnosis not present

## 2020-04-14 DIAGNOSIS — M25522 Pain in left elbow: Secondary | ICD-10-CM

## 2020-04-14 DIAGNOSIS — D049 Carcinoma in situ of skin, unspecified: Secondary | ICD-10-CM

## 2020-04-14 DIAGNOSIS — M79641 Pain in right hand: Secondary | ICD-10-CM

## 2020-04-14 DIAGNOSIS — M79675 Pain in left toe(s): Secondary | ICD-10-CM | POA: Diagnosis not present

## 2020-04-14 DIAGNOSIS — M25521 Pain in right elbow: Secondary | ICD-10-CM | POA: Diagnosis not present

## 2020-04-14 DIAGNOSIS — M533 Sacrococcygeal disorders, not elsewhere classified: Secondary | ICD-10-CM | POA: Diagnosis not present

## 2020-04-14 DIAGNOSIS — S065XAA Traumatic subdural hemorrhage with loss of consciousness status unknown, initial encounter: Secondary | ICD-10-CM

## 2020-04-14 DIAGNOSIS — Z8673 Personal history of transient ischemic attack (TIA), and cerebral infarction without residual deficits: Secondary | ICD-10-CM

## 2020-04-14 DIAGNOSIS — R011 Cardiac murmur, unspecified: Secondary | ICD-10-CM

## 2020-04-14 DIAGNOSIS — L920 Granuloma annulare: Secondary | ICD-10-CM

## 2020-04-14 DIAGNOSIS — M160 Bilateral primary osteoarthritis of hip: Secondary | ICD-10-CM | POA: Diagnosis not present

## 2020-04-14 DIAGNOSIS — G8929 Other chronic pain: Secondary | ICD-10-CM

## 2020-04-14 DIAGNOSIS — E1140 Type 2 diabetes mellitus with diabetic neuropathy, unspecified: Secondary | ICD-10-CM

## 2020-04-15 NOTE — Progress Notes (Signed)
Labs are consistent with rheumatoid arthritis.  Please asked patient to get CBC, CMP and G6PD.  Please schedule an earlier appointment if possible to start him on Plaquenil.

## 2020-04-16 LAB — COMPLETE METABOLIC PANEL WITH GFR
AG Ratio: 1.6 (calc) (ref 1.0–2.5)
ALT: 9 U/L (ref 9–46)
AST: 11 U/L (ref 10–35)
Albumin: 4.1 g/dL (ref 3.6–5.1)
Alkaline phosphatase (APISO): 65 U/L (ref 35–144)
BUN/Creatinine Ratio: 31 (calc) — ABNORMAL HIGH (ref 6–22)
BUN: 45 mg/dL — ABNORMAL HIGH (ref 7–25)
CO2: 18 mmol/L — ABNORMAL LOW (ref 20–32)
Calcium: 9.8 mg/dL (ref 8.6–10.3)
Chloride: 107 mmol/L (ref 98–110)
Creat: 1.44 mg/dL — ABNORMAL HIGH (ref 0.70–1.18)
GFR, Est African American: 54 mL/min/{1.73_m2} — ABNORMAL LOW (ref >=60)
GFR, Est Non African American: 47 mL/min/{1.73_m2} — ABNORMAL LOW (ref >=60)
Globulin: 2.6 g/dL (calc) (ref 1.9–3.7)
Glucose, Bld: 184 mg/dL — ABNORMAL HIGH (ref 65–99)
Potassium: 5.4 mmol/L — ABNORMAL HIGH (ref 3.5–5.3)
Sodium: 139 mmol/L (ref 135–146)
Total Bilirubin: 0.2 mg/dL (ref 0.2–1.2)
Total Protein: 6.7 g/dL (ref 6.1–8.1)

## 2020-04-16 LAB — RHEUMATOID FACTOR: Rheumatoid fact SerPl-aCnc: 67 IU/mL — ABNORMAL HIGH (ref <14)

## 2020-04-16 LAB — CBC WITH DIFFERENTIAL/PLATELET
Absolute Monocytes: 601 cells/uL (ref 200–950)
Basophils Absolute: 57 cells/uL (ref 0–200)
Basophils Relative: 0.4 %
Eosinophils Absolute: 286 cells/uL (ref 15–500)
Eosinophils Relative: 2 %
HCT: 32.1 % — ABNORMAL LOW (ref 38.5–50.0)
Hemoglobin: 9.5 g/dL — ABNORMAL LOW (ref 13.2–17.1)
Lymphs Abs: 2789 cells/uL (ref 850–3900)
MCH: 24.1 pg — ABNORMAL LOW (ref 27.0–33.0)
MCHC: 29.6 g/dL — ABNORMAL LOW (ref 32.0–36.0)
MCV: 81.3 fL (ref 80.0–100.0)
MPV: 10.8 fL (ref 7.5–12.5)
Monocytes Relative: 4.2 %
Neutro Abs: 10568 cells/uL — ABNORMAL HIGH (ref 1500–7800)
Neutrophils Relative %: 73.9 %
Platelets: 308 10*3/uL (ref 140–400)
RBC: 3.95 10*6/uL — ABNORMAL LOW (ref 4.20–5.80)
RDW: 16.5 % — ABNORMAL HIGH (ref 11.0–15.0)
Total Lymphocyte: 19.5 %
WBC: 14.3 10*3/uL — ABNORMAL HIGH (ref 3.8–10.8)

## 2020-04-16 LAB — CYCLIC CITRUL PEPTIDE ANTIBODY, IGG: Cyclic Citrullin Peptide Ab: >250 UNITS — ABNORMAL HIGH

## 2020-04-16 LAB — TEST AUTHORIZATION

## 2020-04-16 LAB — SEDIMENTATION RATE: Sed Rate: 33 mm/h — ABNORMAL HIGH (ref 0–20)

## 2020-04-16 LAB — URIC ACID: Uric Acid, Serum: 5.5 mg/dL (ref 4.0–8.0)

## 2020-04-17 ENCOUNTER — Encounter: Payer: Self-pay | Admitting: Rheumatology

## 2020-04-17 ENCOUNTER — Other Ambulatory Visit: Payer: Self-pay

## 2020-04-17 ENCOUNTER — Ambulatory Visit: Payer: Medicare HMO | Admitting: Rheumatology

## 2020-04-17 VITALS — BP 130/74 | HR 68 | Resp 18 | Ht 70.0 in | Wt 253.0 lb

## 2020-04-17 DIAGNOSIS — M533 Sacrococcygeal disorders, not elsewhere classified: Secondary | ICD-10-CM | POA: Diagnosis not present

## 2020-04-17 DIAGNOSIS — Z8673 Personal history of transient ischemic attack (TIA), and cerebral infarction without residual deficits: Secondary | ICD-10-CM

## 2020-04-17 DIAGNOSIS — Z79899 Other long term (current) drug therapy: Secondary | ICD-10-CM | POA: Diagnosis not present

## 2020-04-17 DIAGNOSIS — E785 Hyperlipidemia, unspecified: Secondary | ICD-10-CM

## 2020-04-17 DIAGNOSIS — L920 Granuloma annulare: Secondary | ICD-10-CM

## 2020-04-17 DIAGNOSIS — D049 Carcinoma in situ of skin, unspecified: Secondary | ICD-10-CM

## 2020-04-17 DIAGNOSIS — S065XAA Traumatic subdural hemorrhage with loss of consciousness status unknown, initial encounter: Secondary | ICD-10-CM

## 2020-04-17 DIAGNOSIS — E1169 Type 2 diabetes mellitus with other specified complication: Secondary | ICD-10-CM

## 2020-04-17 DIAGNOSIS — M24521 Contracture, right elbow: Secondary | ICD-10-CM

## 2020-04-17 DIAGNOSIS — Z794 Long term (current) use of insulin: Secondary | ICD-10-CM

## 2020-04-17 DIAGNOSIS — M5136 Other intervertebral disc degeneration, lumbar region: Secondary | ICD-10-CM

## 2020-04-17 DIAGNOSIS — R011 Cardiac murmur, unspecified: Secondary | ICD-10-CM

## 2020-04-17 DIAGNOSIS — I10 Essential (primary) hypertension: Secondary | ICD-10-CM

## 2020-04-17 DIAGNOSIS — S065X9A Traumatic subdural hemorrhage with loss of consciousness of unspecified duration, initial encounter: Secondary | ICD-10-CM

## 2020-04-17 DIAGNOSIS — E1140 Type 2 diabetes mellitus with diabetic neuropathy, unspecified: Secondary | ICD-10-CM

## 2020-04-17 DIAGNOSIS — G8929 Other chronic pain: Secondary | ICD-10-CM

## 2020-04-17 DIAGNOSIS — M0579 Rheumatoid arthritis with rheumatoid factor of multiple sites without organ or systems involvement: Secondary | ICD-10-CM | POA: Diagnosis not present

## 2020-04-17 DIAGNOSIS — M160 Bilateral primary osteoarthritis of hip: Secondary | ICD-10-CM

## 2020-04-17 DIAGNOSIS — M1712 Unilateral primary osteoarthritis, left knee: Secondary | ICD-10-CM | POA: Diagnosis not present

## 2020-04-17 NOTE — Patient Instructions (Signed)
Standing Labs We placed an order today for your standing lab work.   Please have your standing labs drawn in 2 weeks, 1 month, then every 3 months   If possible, please have your labs drawn 2 weeks prior to your appointment so that the provider can discuss your results at your appointment.  We have open lab daily Monday through Thursday from 8:30-12:30 PM and 1:30-4:30 PM and Friday from 8:30-12:30 PM and 1:30-4:00 PM at the office of Dr. Bo Merino, [facility].   Please be advised, patients with office appointments requiring lab work will take precedents over walk-in lab work.  If possible, please come for your lab work on Monday and Friday afternoons, as you may experience shorter wait times.  The office is located at [address] [place], [place], [place] [address] No appointment is necessary.   Labs are drawn by [facility]. Please bring your co-pay at the time of your lab draw.  You may receive a bill from [facility] for your lab work.  If you wish to have your labs drawn at another location, please call the office 24 hours in advance to send orders.  If you have any questions regarding directions or hours of operation,  please call [phone].   As a reminder, please drink plenty of water prior to coming for your lab work. Thanks!    Hydroxychloroquine tablets What is this medicine? HYDROXYCHLOROQUINE (hye drox ee KLOR oh kwin) is used to treat rheumatoid arthritis and systemic lupus erythematosus. It is also used to treat malaria. This medicine may be used for other purposes; ask your health care provider or pharmacist if you have questions. COMMON BRAND NAME(S): Plaquenil, Quineprox What should I tell my health care provider before I take this medicine? They need to know if you have any of these conditions:  diabetes  eye disease, vision problems  G6PD deficiency  heart disease  history of irregular heartbeat  if you often drink alcohol  kidney  disease  liver disease  porphyria  psoriasis  an unusual or allergic reaction to chloroquine, hydroxychloroquine, other medicines, foods, dyes, or preservatives  pregnant or trying to get pregnant  breast-feeding How should I use this medicine? Take this medicine by mouth with a glass of water. Follow the directions on the prescription label. Do not cut, crush or chew this medicine. Swallow the tablets whole. Take this medicine with food. Avoid taking antacids within 4 hours of taking this medicine. It is best to separate these medicines by at least 4 hours. Take your medicine at regular intervals. Do not take it more often than directed. Take all of your medicine as directed even if you think you are better. Do not skip doses or stop your medicine early. Talk to your pediatrician regarding the use of this medicine in children. While this drug may be prescribed for selected conditions, precautions do apply. Overdosage: If you think you have taken too much of this medicine contact a poison control center or emergency room at once. NOTE: This medicine is only for you. Do not share this medicine with others. What if I miss a dose? If you miss a dose, take it as soon as you can. If it is almost time for your next dose, take only that dose. Do not take double or extra doses. What may interact with this medicine? Do not take this medicine with any of the following medications:  cisapride  dronedarone  pimozide  thioridazine This medicine may also interact  with the following medications:  ampicillin  antacids  cimetidine  cyclosporine  digoxin  kaolin  medicines for diabetes, like insulin, glipizide, glyburide  medicines for seizures like carbamazepine, phenobarbital, phenytoin  mefloquine  methotrexate  other medicines that prolong the QT interval (cause an abnormal heart rhythm)  praziquantel This list may not describe all possible interactions. Give your health care  provider a list of all the medicines, herbs, non-prescription drugs, or dietary supplements you use. Also tell them if you smoke, drink alcohol, or use illegal drugs. Some items may interact with your medicine. What should I watch for while using this medicine? Visit your health care professional for regular checks on your progress. Tell your health care professional if your symptoms do not start to get better or if they get worse. You may need blood work done while you are taking this medicine. If you take other medicines that can affect heart rhythm, you may need more testing. Talk to your health care professional if you have questions. Your vision may be tested before and during use of this medicine. Tell your health care professional right away if you have any change in your eyesight. What side effects may I notice from receiving this medicine? Side effects that you should report to your doctor or health care professional as soon as possible:  allergic reactions like skin rash, itching or hives, swelling of the face, lips, or tongue  changes in vision  decreased hearing or ringing of the ears  muscle weakness  redness, blistering, peeling or loosening of the skin, including inside the mouth  sensitivity to light  signs and symptoms of a dangerous change in heartbeat or heart rhythm like chest pain; dizziness; fast or irregular heartbeat; palpitations; feeling faint or lightheaded, falls; breathing problems  signs and symptoms of liver injury like dark yellow or brown urine; general ill feeling or flu-like symptoms; light-colored stools; loss of appetite; nausea; right upper belly pain; unusually weak or tired; yellowing of the eyes or skin  signs and symptoms of low blood sugar such as feeling anxious; confusion; dizziness; increased hunger; unusually weak or tired; sweating; shakiness; cold; irritable; headache; blurred vision; fast heartbeat; loss of consciousness  suicidal  thoughts  uncontrollable head, mouth, neck, arm, or leg movements Side effects that usually do not require medical attention (report to your doctor or health care professional if they continue or are bothersome):  diarrhea  dizziness  hair loss  headache  irritable  loss of appetite  nausea, vomiting  stomach pain This list may not describe all possible side effects. Call your doctor for medical advice about side effects. You may report side effects to FDA at 1-800-FDA-1088. Where should I keep my medicine? Keep out of the reach of children. Store at room temperature between 15 and 30 degrees C (59 and 86 degrees F). Protect from moisture and light. Throw away any unused medicine after the expiration date. NOTE: This sheet is a summary. It may not cover all possible information. If you have questions about this medicine, talk to your doctor, pharmacist, or health care provider.  2020 Elsevier/Gold Standard (2019-02-12 12:56:32)     Leflunomide tablets What is this medicine? LEFLUNOMIDE (le FLOO na mide) is for rheumatoid arthritis. This medicine may be used for other purposes; ask your health care provider or pharmacist if you have questions. COMMON BRAND NAME(S): Arava What should I tell my health care provider before I take this medicine? They need to know if you have any  of these conditions:  diabetes  have a fever or infection  high blood pressure  immune system problems  kidney disease  liver disease  low blood cell counts, like low white cell, platelet, or red cell counts  lung or breathing disease, like asthma  recently received or scheduled to receive a vaccine  receiving treatment for cancer  skin conditions or sensitivity  tingling of the fingers or toes, or other nerve disorder  tuberculosis  an unusual or allergic reaction to leflunomide, teriflunomide, other medicines, food, dyes, or preservatives  pregnant or trying to get  pregnant  breast-feeding How should I use this medicine? Take this medicine by mouth with a full glass of water. Follow the directions on the prescription label. Take your medicine at regular intervals. Do not take your medicine more often than directed. Do not stop taking except on your doctor's advice. Talk to your pediatrician regarding the use of this medicine in children. Special care may be needed. Overdosage: If you think you have taken too much of this medicine contact a poison control center or emergency room at once. NOTE: This medicine is only for you. Do not share this medicine with others. What if I miss a dose? If you miss a dose, take it as soon as you can. If it is almost time for your next dose, take only that dose. Do not take double or extra doses. What may interact with this medicine? Do not take this medicine with any of the following medications:  teriflunomide This medicine may also interact with the following medications:  alosetron  birth control pills  caffeine  cefaclor  certain medicines for diabetes like nateglinide, repaglinide, rosiglitazone, pioglitazone  certain medicines for high cholesterol like atorvastatin, pravastatin, rosuvastatin, simvastatin  charcoal  cholestyramine  ciprofloxacin  duloxetine  furosemide  ketoprofen  live virus vaccines  medicines that increase your risk for infection  methotrexate  mitoxantrone  paclitaxel  penicillin  theophylline  tizanidine  warfarin This list may not describe all possible interactions. Give your health care provider a list of all the medicines, herbs, non-prescription drugs, or dietary supplements you use. Also tell them if you smoke, drink alcohol, or use illegal drugs. Some items may interact with your medicine. What should I watch for while using this medicine? Visit your health care provider for regular checks on your progress. Tell your doctor or health care provider if  your symptoms do not start to get better or if they get worse. You may need blood work done while you are taking this medicine. This medicine may cause serious skin reactions. They can happen weeks to months after starting the medicine. Contact your health care provider right away if you notice fevers or flu-like symptoms with a rash. The rash may be red or purple and then turn into blisters or peeling of the skin. Or, you might notice a red rash with swelling of the face, lips or lymph nodes in your neck or under your arms. This medicine may stay in your body for up to 2 years after your last dose. Tell your doctor about any unusual side effects or symptoms. A medicine can be given to help lower your blood levels of this medicine more quickly. Women must use effective birth control with this medicine. There is a potential for serious side effects to an unborn child. Do not become pregnant while taking this medicine. Inform your doctor if you wish to become pregnant. This medicine remains in your blood after  you stop taking it. You must continue using effective birth control until the blood levels have been checked and they are low enough. A medicine can be given to help lower your blood levels of this medicine more quickly. Immediately talk to your doctor if you think you may be pregnant. You may need a pregnancy test. Talk to your health care provider or pharmacist for more information. You should not receive certain vaccines during your treatment and for a certain time after your treatment with this medication ends. Talk to your health care provider for more information. What side effects may I notice from receiving this medicine? Side effects that you should report to your doctor or health care professional as soon as possible:  allergic reactions like skin rash, itching or hives, swelling of the face, lips, or tongue  breathing problems  cough  increased blood pressure  low blood counts - this  medicine may decrease the number of white blood cells and platelets. You may be at increased risk for infections and bleeding.  pain, tingling, numbness in the hands or feet  rash, fever, and swollen lymph nodes  redness, blistering, peeing or loosening of the skin, including inside the mouth  signs of decreased platelets or bleeding - bruising, pinpoint red spots on the skin, black, tarry stools, blood in urine  signs of infection - fever or chills, cough, sore throat, pain or trouble passing urine  signs and symptoms of liver injury like dark yellow or brown urine; general ill feeling or flu-like symptoms; light-colored stools; loss of appetite; nausea; right upper belly pain; unusually weak or tired; yellowing of the eyes or skin  trouble passing urine or change in the amount of urine  vomiting Side effects that usually do not require medical attention (report to your doctor or health care professional if they continue or are bothersome):  diarrhea  hair thinning or loss  headache  nausea  tiredness This list may not describe all possible side effects. Call your doctor for medical advice about side effects. You may report side effects to FDA at 1-800-FDA-1088. Where should I keep my medicine? Keep out of the reach of children. Store at room temperature between 15 and 30 degrees C (59 and 86 degrees F). Protect from moisture and light. Throw away any unused medicine after the expiration date. NOTE: This sheet is a summary. It may not cover all possible information. If you have questions about this medicine, talk to your doctor, pharmacist, or health care provider.  2020 Elsevier/Gold Standard (2019-01-05 15:06:48)

## 2020-04-17 NOTE — Progress Notes (Signed)
Office Visit Note  Patient: Tommy Green             Date of Birth: [birth year]           MRN: [id number]             PCP: [name], DO Referring: [name]* Visit Date: 04/17/2020 Occupation: @GUAROCC @  Subjective:  Pain in multiple joints   History of Present Illness: Tommy Green is a 74 y.o. male with history of seropositive rheumatoid arthritis.  He presents today to discuss lab work.  He is currently having pain in both shoulder joints, both elbow joints, and both wrist joints.  According to the patient he woke up around 3 AM last night and is experiencing severe pain and stiffness in bilateral upper extremities.  He states that his stiffness and discomfort have started to improve since he has been moving around more this morning.  He takes Tylenol as needed for pain relief.  He avoids NSAIDs due to abnormal kidney function.  He would like to discuss treatment options to manage his symptoms.  Activities of Daily Living:  Patient reports morning stiffness for  several hours.   Patient Reports nocturnal pain.  Difficulty dressing/grooming: Denies Difficulty climbing stairs: Denies Difficulty getting out of chair: Denies Difficulty using hands for taps, buttons, cutlery, and/or writing: Reports  Review of Systems  Constitutional: Negative for fatigue.  HENT: Positive for mouth dryness. Negative for mouth sores and nose dryness.   Eyes: Negative for itching and dryness.  Respiratory: Negative for shortness of breath and difficulty breathing.   Cardiovascular: Negative for chest pain and palpitations.  Gastrointestinal: Negative for blood in stool, constipation and diarrhea.  Endocrine: Negative for increased urination.  Genitourinary: Negative for difficulty urinating.  Musculoskeletal: Positive for arthralgias, joint pain, joint swelling and morning stiffness. Negative for myalgias, muscle tenderness and myalgias.  Skin: Negative for color  change, rash and redness.  Allergic/Immunologic: Negative for susceptible to infections.  Neurological: Negative for dizziness, numbness, headaches, memory loss and weakness.  Hematological: Negative for bruising/bleeding tendency.  Psychiatric/Behavioral: Negative for confusion.    PMFS History:  Patient Active Problem List   Diagnosis Date Noted  . Acute kidney injury superimposed on chronic kidney disease ([place])   . Respiratory failure ([place])   . Acute encephalopathy   . Hypernatremia   . Dyspnea   . Acute respiratory failure with hypoxemia ([place])   . SDH (subdural hematoma) ([place]) 10/24/2019  . Chronic pain of right knee 10/03/2019  . Cyclic vomiting syndrome 08/06/2019  . Neuropathy 12/25/2018  . Bilateral hip pain 08/24/2018  . AK (actinic keratosis) 08/24/2018  . Neoplasm of uncertain behavior 79/48/0165  . 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 Tendinopathy of right gluteus medius 07/13/2018  . Tendinopathy of left gluteus medius 07/13/2018  . Squamous cell carcinoma in situ (SCCIS) of skin 03/24/2018  . Skin lesion 03/17/2018  . Essential hypertension 07/18/2017  . Hyperlipidemia associated with type 2 diabetes mellitus ([place]) 07/18/2017  . Type 2 diabetes mellitus with diabetic neuropathy, unspecified ([place]) 07/18/2017  . Heart murmur 07/18/2017  . Hypertriglyceridemia 07/18/2017  . History of stroke 07/18/2017  . Pain in joint, ankle and foot 02/06/2013    Past Medical History:  Diagnosis Date  . Anemia   . Essential hypertension 07/18/2017  . History of rheumatic fever   . History of stroke 07/18/2017  . Hypertriglyceridemia 07/18/2017  . Type 2 diabetes mellitus with diabetic neuropathy, unspecified ([place]) 07/18/2017  Family History  Problem Relation Age of Onset  . Hyperlipidemia Mother   . Hyperlipidemia Father   . Alcoholism Brother   . Healthy Son   . Healthy Son   . Cancer Neg Hx    Past Surgical History:  Procedure Laterality Date  . CRANIOTOMY Left  10/25/2019   Procedure: CRANIOTOMY HEMATOMA EVACUATION SUBDURAL;  Surgeon: Vallarie Mare, MD;  Location: [employer] [employer];  Service: Neurosurgery;  Laterality: Left;  . FOOT CAPSULOTOMY Left 07/25/2008   Mid Foot #2 MPJ  . Hammertoe Repair Left 07/25/2008   #2 toe  . SPINAL FUSION    . TARSAL TUNNEL RELEASE Left 07/25/2008   Social History   Social History Narrative  . Not on file   Immunization History  Administered Date(s) Administered  . Td 12/25/2018     Objective: Vital Signs: BP 130/74 (BP Location: Left Arm, Patient Position: Sitting, Cuff Size: Normal)   Pulse 68   Resp 18   Ht 5' 10"  (1.778 m)   Wt 253 lb (114.8 kg)   BMI 36.30 kg/m    Physical Exam Vitals and nursing note reviewed.  Constitutional:      Appearance: He is well-developed.  HENT:     Head: Normocephalic and atraumatic.  Eyes:     Conjunctiva/sclera: Conjunctivae normal.     Pupils: Pupils are equal, round, and reactive to light.  Pulmonary:     Effort: Pulmonary effort is normal.  Abdominal:     General: Bowel sounds are normal.     Palpations: Abdomen is soft.  Musculoskeletal:     Cervical back: Normal range of motion and neck supple.  Skin:    General: Skin is warm and dry.     Capillary Refill: Capillary refill takes less than 2 seconds.  Neurological:     Mental Status: He is alert and oriented to person, place, and time.  Psychiatric:        Behavior: Behavior normal.      Musculoskeletal Exam: C-spine good ROM.  Thoracic kyphosis noted.  Shoulder joints have painful ROM bilaterally.  Right elbow joint contracture noted.  Olecranon bursitis of the right elbow noted.  Tenderness of both wrist joints noted.  PIP and DIP thickening consistent with osteoarthritis of both hands.  Hip joints good ROM with no discomfort.  Right knee warmth and inflammation noted.  Left knee has no warmth or effusion.  Ankle joints good ROM with no inflammation.   CDAI Exam: CDAI Score: -- Patient Global: --;  Provider Global: -- Swollen: 2 ; Tender: 6  Joint Exam 04/17/2020      Right  Left  Glenohumeral   Tender   Tender  Elbow  Swollen Tender     Wrist   Tender   Tender  Knee  Swollen Tender        Investigation: No additional findings.  Imaging: DG Lumbar Spine Complete  Result Date: 03/30/2020 CLINICAL DATA:  Low back pain EXAM: LUMBAR SPINE - COMPLETE 4+ VIEW COMPARISON:  None. FINDINGS: Frontal, lateral, spot lumbosacral lateral, and bilateral oblique views were obtained. There are 5 non-rib-bearing lumbar type vertebral bodies. There is posterior screw and plate fixation at L4 and L5 with support hardware intact. There is a disc spacer at L4-5. No fracture evident. There is 6 mm of anterolisthesis of L5 on S1. No other spondylolisthesis is evident. There is moderate disc space narrowing at all levels. There is facet osteoarthritic change at L4-5 and L5-S1 bilaterally. IMPRESSION: Postoperative change at  L4 and L5 with support hardware intact. No fracture. Spondylolisthesis at L5-S1 present. No other spondylolisthesis. Multifocal osteoarthritic change noted. Electronically Signed   By: Lowella Grip III M.D.   On: 03/30/2020 10:31   DG Hip Unilat W or Wo Pelvis 2-3 Views Left  Result Date: 03/30/2020 CLINICAL DATA:  Atraumatic left hip pain EXAM: DG HIP (WITH OR WITHOUT PELVIS) 2-3V LEFT COMPARISON:  10/24/2019 FINDINGS: Bilateral axial hip space narrowing with marginal osteophytes. No acute fracture or erosion. There has been L4-5 PLIF with L3-4 advanced left-sided degenerative disc narrowing. IMPRESSION: 1. No acute finding. 2. Bilateral hip osteoarthritis. Electronically Signed   By: Monte Fantasia M.D.   On: 03/30/2020 10:29   XR Elbow 2 Views Right  Result Date: 04/14/2020 Mild elbow joint narrowing was noted.  Posterior and inferior spurring of the olecranon was noted. Impression: These findings are consistent with osteoarthritis of the elbow.  XR Hand 2 View Left  Result  Date: 04/14/2020 Severe PIP and DIP narrowing was noted.  CMC narrowing was noted.  No MCP, intercarpal radiocarpal joint space narrowing was noted. Impression: These findings are consistent with osteoarthritis of the hand.  XR Hand 2 View Right  Result Date: 04/14/2020 Narrowing of first, second and third MCPs was noted.  Severe narrowing of PIP and DIP joints was noted.  No intercarpal radiocarpal joint space narrowing was noted.  No erosive changes were noted. Impression: These findings are consistent with osteoarthritis.  Narrowing of MCP joints can be seen in inflammatory arthritis.  XR Pelvis 1-2 Views  Result Date: 04/14/2020 No SI joint to sclerosis or narrowing was noted. Impression: Unremarkable x-ray of the SI joints.   Recent Labs: Lab Results  Component Value Date   WBC 14.3 (H) 04/14/2020   HGB 9.5 (L) 04/14/2020   PLT 308 04/14/2020   NA 139 04/14/2020   K 5.4 (H) 04/14/2020   CL 107 04/14/2020   CO2 18 (L) 04/14/2020   GLUCOSE 184 (H) 04/14/2020   BUN 45 (H) 04/14/2020   CREATININE 1.44 (H) 04/14/2020   BILITOT 0.2 04/14/2020   ALKPHOS 53 12/05/2019   AST 11 04/14/2020   ALT 9 04/14/2020   PROT 6.7 04/14/2020   ALBUMIN 3.8 12/05/2019   CALCIUM 9.8 04/14/2020   GFRAA 54 (L) 04/14/2020  April 14, 2020 uric acid 5.5, RF 67, anti-CCP> 250, ESR 33  Speciality Comments: No specialty comments available.  Procedures:  No procedures performed Allergies: Influenza vaccines, Aspirin, Atorvastatin, Canagliflozin, and Statins   Assessment / Plan:     Visit Diagnoses: Rheumatoid arthritis involving multiple sites with positive rheumatoid factor (HCC) - Positive RF, positive anti-CCP, elevated sedimentation rate, history of inflammatory arthritis: He presents today with pain in multiple joints including both shoulders, both elbows, both wrist joints.  He has olecranon bursitis of the right elbow joint and a contracture of the right elbow joint on exam.  He also has warmth and  inflammation in the right knee joint.  He has been taking Tylenol as needed for pain relief.  He has morning stiffness lasting several hours every morning.  We discussed lab work obtained on 04/14/2020 and all questions were addressed.  We also discussed the diagnosis of rheumatoid arthritis.  Different treatment options were discussed today.  He will be starting on Arava 10 mg 1 tablet by mouth daily.  Indications, contraindications, potential side effects of Arava were discussed today.  All questions were addressed and consent was obtained.  We will obtain baseline immunosuppressive  lab work prior to sending in the prescription for [place].  If he continues to have recurrent flares we will discuss adding on Plaquenil as combination therapy.  He was given a handout of information about Arava and Plaquenil to review.  He was advised to notify us if he cannot tolerate taking Arava.  He will follow-up in the office in 6 weeks to assess his response.  Medication counseling:  Baseline Immunosuppressant Therapy Labs:  Patient was counseled on the purpose, proper use, and adverse effects of leflunomide including risk of infection, nausea/diarrhea/weight loss, increase in blood pressure, rash, hair loss, tingling in the hands and feet, and signs and symptoms of interstitial lung disease.   Also counseled on Black Box warning of liver injury and importance of avoiding alcohol while on therapy. Discussed that there is the possibility of an increased risk of malignancy but it is not well understood if this increased risk is due to the medication or the disease state.  Counseled patient to avoid live vaccines. Recommend annual influenza, Pneumovax 23, Prevnar 13, and Shingrix as indicated.   Discussed the importance of frequent monitoring of liver function and blood count.  Standing orders placed.   Provided patient with educational materials on leflunomide and answered all questions.  Patient consented to [place] use, and  consent will be uploaded into the media tab.   Patient dose will be 10 mg 1 tablet daily.  Prescription pending lab results and/or insurance approval.  High risk medication use: He will be starting on Arava 10 mg 1 tablet by mouth daily.  He will return for lab work in 2 weeks then 1 month then every 3 months.  Standing orders for CBC and CMP will be placed today.  If he does not notice any clinical improvement, we will we will discuss adding on Plaquenil in the future.  He is not a good candidate for methotrexate due to elevated creatinine and low GFR.  We will obtain baseline immunosuppressive lab work today prior to sending in the prescription for [place].  Contracture of right elbow: He has a right elbow joint contracture.  Olecranon bursitis of the right elbow is noted on exam today.  Primary osteoarthritis of both hips - Moderate to severe osteoarthritis of both hips.  He experiences intermittent discomfort and stiffness in both hips especially the left hip joint.  He has good range of motion on exam today.  Primary osteoarthritis of left knee - Mild osteoarthritis.  He has good range of motion of the left knee joint on exam.  No warmth or effusion was noted.  Chronic SI joint pain - X-rays were unremarkable.  He is not experiencing any SI joint pain at this time.  DDD (degenerative disc disease), lumbar - L4-L5 fusion by Dr.Cohen.  Facet joint arthropathy.  He experiences intermittent discomfort in his lower back.  Other medical conditions are listed as follows:  Essential hypertension  Hyperlipidemia associated with type 2 diabetes mellitus ([place])  Heart murmur  History of stroke - At age 56 with left-sided weakness  SDH (subdural hematoma) ([place])  Type 2 diabetes mellitus with diabetic neuropathy, with long-term current use of insulin (HCC)  Squamous cell carcinoma in situ (SCCIS) of skin  Granuloma annulare  Orders: Orders Placed This Encounter  Procedures  . CBC with  Differential/Platelet  . COMPLETE METABOLIC PANEL WITH GFR  . Hepatitis B core antibody, IgM  . Hepatitis B surface antigen  . Hepatitis C antibody  . Hepatitis C RNA quantitative  .  HIV Antibody (routine testing w rflx)  . QuantiFERON-TB Gold Plus  . Serum protein electrophoresis with reflex  . IgG, IgA, IgM  . Glucose 6 phosphate dehydrogenase  . Thiopurine methyltransferase(tpmt)rbc   No orders of the defined types were placed in this encounter.   Face-to-face time spent with patient was 30 minutes. Greater than 50% of time was spent in counseling and coordination of care.  Follow-Up Instructions: Return in about 6 weeks (around 05/29/2020) for Rheumatoid arthritis.   Hazel Sams, PA-C I examined and evaluated the patient with Hazel Sams PA.  We are detailed discussion with patient regarding rheumatoid arthritis.  Different treatment options and their side effects were discussed.  He was in agreement to proceed with leflunomide.  Handout was given and consent was taken.  We will start him on leflunomide once lab results are available.  The plan of care was discussed as noted above.  Bo Merino, MD  Note - This record has been created using Editor, commissioning.  Chart creation errors have been sought, but may not always  have been located. Such creation errors do not reflect on  the standard of medical care.

## 2020-04-17 NOTE — Progress Notes (Signed)
Pharmacy Note  Subjective: Patient presents today to the [facility] for follow up office visit.  Patient seen by the pharmacist for counseling on leflunomide Jolee Ewing) for rheumatoid arthritis.  He is naive to therapy.  He is not a candidate for methotrexate therapy due to mild to moderate renal impairment.  Objective: CBC    Component Value Date/Time   WBC 14.3 (H) 04/14/2020 0902   RBC 3.95 (L) 04/14/2020 0902   HGB 9.5 (L) 04/14/2020 0902   HGB 11.6 (L) 04/15/2006 0905   HCT 32.1 (L) 04/14/2020 0902   HCT 33.9 (L) 04/15/2006 0905   PLT 308 04/14/2020 0902   PLT 422 (H) 04/15/2006 0905   MCV 81.3 04/14/2020 0902   MCV 86.9 04/15/2006 0905   MCH 24.1 (L) 04/14/2020 0902   MCHC 29.6 (L) 04/14/2020 0902   RDW 16.5 (H) 04/14/2020 0902   RDW 14.6 04/15/2006 0905   LYMPHSABS 2,789 04/14/2020 0902   LYMPHSABS 2.1 04/15/2006 0905   MONOABS 0.6 10/27/2019 0606   MONOABS 0.5 04/15/2006 0905   EOSABS 286 04/14/2020 0902   EOSABS 0.3 04/15/2006 0905   BASOSABS 57 04/14/2020 0902   BASOSABS 0.1 04/15/2006 0905    CMP     Component Value Date/Time   NA 139 04/14/2020 0902   K 5.4 (H) 04/14/2020 0902   CL 107 04/14/2020 0902   CO2 18 (L) 04/14/2020 0902   GLUCOSE 184 (H) 04/14/2020 0902   BUN 45 (H) 04/14/2020 0902   CREATININE 1.44 (H) 04/14/2020 0902   CALCIUM 9.8 04/14/2020 0902   PROT 6.7 04/14/2020 0902   ALBUMIN 3.8 12/05/2019 1143   AST 11 04/14/2020 0902   ALT 9 04/14/2020 0902   ALKPHOS 53 12/05/2019 1143   BILITOT 0.2 04/14/2020 0902   GFRNONAA 47 (L) 04/14/2020 0902   GFRAA 54 (L) 04/14/2020 0902    Baseline Immunosuppressant Therapy Labs   Chest x-ray: Bibasilar atelectasis. No active disease. 2007  Assessment/Plan:  Patient was counseled on the purpose, proper use, and adverse effects of leflunomide including risk of infection, nausea/diarrhea/weight loss, increase in blood pressure, rash, hair loss, tingling in the hands and feet, and signs  and symptoms of interstitial lung disease.   Also counseled on Black Box warning of liver injury and importance of avoiding alcohol while on therapy. Discussed that there is the possibility of an increased risk of malignancy but it is not well understood if this increased risk is due to the medication or the disease state.  Counseled patient to avoid live vaccines. Recommend annual influenza, Pneumovax 23, Prevnar 13, and Shingrix as indicated.   Discussed the importance of frequent monitoring of liver function and blood count.  Standing orders placed.  Provided patient with educational materials on leflunomide and answered all questions.  Patient consented to [place] use, and consent will be uploaded into the media tab.    Patient dose will be 10 mg daily.  Prescription pending lab results and/or insurance approval.   Mariella Saa, PharmD, BCACP, CPP Clinical Specialty Pharmacist (Rheumatology and Pulmonology)  04/17/2020 10:18 AM

## 2020-04-22 ENCOUNTER — Other Ambulatory Visit: Payer: Self-pay

## 2020-04-22 ENCOUNTER — Ambulatory Visit (INDEPENDENT_AMBULATORY_CARE_PROVIDER_SITE_OTHER): Payer: Medicare HMO | Admitting: Family Medicine

## 2020-04-22 ENCOUNTER — Encounter: Payer: Self-pay | Admitting: Family Medicine

## 2020-04-22 VITALS — BP 140/88 | HR 98 | Temp 99.5°F | Ht 70.0 in | Wt 256.2 lb

## 2020-04-22 DIAGNOSIS — J3489 Other specified disorders of nose and nasal sinuses: Secondary | ICD-10-CM

## 2020-04-22 MED ORDER — CETIRIZINE HCL 10 MG PO TABS: 10.0000 mg | ORAL_TABLET | Freq: Every day | ORAL | 3 refills | Status: DC

## 2020-04-22 MED ORDER — FLUTICASONE PROPIONATE 50 MCG/ACT NA SUSP: 2.0000 | Freq: Every day | NASAL | 6 refills | Status: DC

## 2020-04-22 NOTE — Progress Notes (Signed)
Chief Complaint  Patient presents with  . Follow-up    problems with his throat    Tommy Green here for URI complaints.  Duration: several months  Associated symptoms: sinus congestion, rhinorrhea and sore throat Denies: sinus pain, itchy watery eyes, ear pain, ear drainage, wheezing, shortness of breath, myalgia and fevers, cough  Treatment to date: claritin, unknown nasal spray, decongestants Sick contacts: No  Past Medical History:  Diagnosis Date  . Anemia   . Essential hypertension 07/18/2017  . History of rheumatic fever   . History of stroke 07/18/2017  . Hypertriglyceridemia 07/18/2017  . Type 2 diabetes mellitus with diabetic neuropathy, unspecified ([place]) 07/18/2017    BP 140/88 (BP Location: Left Arm, Patient Position: Sitting, Cuff Size: Normal)   Pulse 98   Temp 99.5 F (37.5 C) (Oral)   Ht 5\' 10"  (1.778 m)   Wt 256 lb 4 oz (116.2 kg)   SpO2 97%   BMI 36.77 kg/m  General: Awake, alert, appears stated age HEENT: AT, [place], ears patent b/l and TM's neg, nares patent w rhinorrhea, pharynx pink and without exudates, MMM Neck: No masses or asymmetry Heart: RRR Lungs: CTAB, no accessory muscle use Psych: Age appropriate judgment and insight, normal mood and affect  Rhinorrhea - Plan: fluticasone (FLONASE) 50 MCG/ACT nasal spray, cetirizine (ZYRTEC) 10 MG tablet  Orders as above. Sounds allergic given duration.  F/u in 3 weeks if no better. Will add Astelin and/or Singulair. Pt voiced understanding and agreement to the plan.  [place], DO 04/22/20 10:52 AM

## 2020-04-22 NOTE — Patient Instructions (Signed)
Claritin (loratadine), Allegra (fexofenadine), Zyrtec (cetirizine) which is also equivalent to Xyzal (levocetirizine); these are listed in order from weakest to strongest. Generic, and therefore cheaper, options are in the parentheses.   Flonase (fluticasone); nasal spray that is over the counter. 2 sprays each nostril, once daily. Aim towards the same side eye when you spray.  There are available OTC, and the generic versions, which may be cheaper, are in parentheses. Show this to a pharmacist if you have trouble finding any of these items.  Let us know if you need anything.

## 2020-04-24 MED ORDER — LEFLUNOMIDE 10 MG PO TABS: 10.0000 mg | ORAL_TABLET | Freq: Every day | ORAL | 1 refills | Status: DC

## 2020-04-24 NOTE — Progress Notes (Signed)
All labs within normal limits or negative except low IgM, albumin ELP, and gamma globulin.  IFE interpretation pending.  He is to start [place].  Do we need to wait for IFE prior to starting [place]?

## 2020-04-24 NOTE — Progress Notes (Signed)
Prescription for [place] sent to local pharmacy.  Please notify patient that lab results within normal limits to start Tommy Green and will be due for labs 2 weeks after starting.

## 2020-04-24 NOTE — Progress Notes (Signed)
Ok to send in prescription for [place].

## 2020-04-24 NOTE — Addendum Note (Signed)
Addended by: Mariella Saa C on: 04/24/2020 01:08 PM   Modules accepted: Orders

## 2020-04-26 LAB — HEPATITIS B CORE ANTIBODY, IGM: Hep B C IgM: NONREACTIVE

## 2020-04-26 LAB — PROTEIN ELECTROPHORESIS, SERUM, WITH REFLEX
Albumin ELP: 3.6 g/dL — ABNORMAL LOW (ref 3.8–4.8)
Alpha 1: 0.3 g/dL (ref 0.2–0.3)
Alpha 2: 0.8 g/dL (ref 0.5–0.9)
Beta 2: 0.3 g/dL (ref 0.2–0.5)
Beta Globulin: 0.5 g/dL (ref 0.4–0.6)
Gamma Globulin: 0.7 g/dL — ABNORMAL LOW (ref 0.8–1.7)
Total Protein: 6.2 g/dL (ref 6.1–8.1)

## 2020-04-26 LAB — IGG, IGA, IGM
IgG (Immunoglobin G), Serum: 832 mg/dL (ref 600–1540)
IgM, Serum: 41 mg/dL — ABNORMAL LOW (ref 50–300)
Immunoglobulin A: 146 mg/dL (ref 70–320)

## 2020-04-26 LAB — QUANTIFERON-TB GOLD PLUS
Mitogen-NIL: 8.13 IU/mL
NIL: 0.02 IU/mL
QuantiFERON-TB Gold Plus: NEGATIVE
TB1-NIL: 0 IU/mL
TB2-NIL: 0 IU/mL

## 2020-04-26 LAB — HEPATITIS C RNA QUANTITATIVE
HCV Quantitative Log: <1.18 Log IU/mL
HCV RNA, PCR, QN: <15 IU/mL

## 2020-04-26 LAB — GLUCOSE 6 PHOSPHATE DEHYDROGENASE: G-6PDH: 19.9 U/g Hgb (ref 7.0–20.5)

## 2020-04-26 LAB — THIOPURINE METHYLTRANSFERASE (TPMT), RBC: Thiopurine Methyltransferase, RBC: 16 nmol/hr/mL RBC

## 2020-04-26 LAB — HEPATITIS C ANTIBODY
Hepatitis C Ab: NONREACTIVE
SIGNAL TO CUT-OFF: 0.01 (ref <1.00)

## 2020-04-26 LAB — IFE INTERPRETATION: Immunofix Electr Int: NOT DETECTED

## 2020-04-26 LAB — HIV ANTIBODY (ROUTINE TESTING W REFLEX): HIV 1&2 Ab, 4th Generation: NONREACTIVE

## 2020-04-26 LAB — HEPATITIS B SURFACE ANTIGEN: Hepatitis B Surface Ag: NONREACTIVE

## 2020-04-28 DIAGNOSIS — E1140 Type 2 diabetes mellitus with diabetic neuropathy, unspecified: Secondary | ICD-10-CM | POA: Diagnosis not present

## 2020-04-28 DIAGNOSIS — M79675 Pain in left toe(s): Secondary | ICD-10-CM | POA: Diagnosis not present

## 2020-04-28 DIAGNOSIS — S90112A Contusion of left great toe without damage to nail, initial encounter: Secondary | ICD-10-CM | POA: Diagnosis not present

## 2020-05-06 ENCOUNTER — Ambulatory Visit: Payer: Medicare HMO | Admitting: Rheumatology

## 2020-05-06 ENCOUNTER — Other Ambulatory Visit: Payer: Self-pay

## 2020-05-06 DIAGNOSIS — Z79899 Other long term (current) drug therapy: Secondary | ICD-10-CM

## 2020-05-07 DIAGNOSIS — R69 Illness, unspecified: Secondary | ICD-10-CM | POA: Diagnosis not present

## 2020-05-07 LAB — CBC WITH DIFFERENTIAL/PLATELET
Absolute Monocytes: 638 cells/uL (ref 200–950)
Basophils Absolute: 78 cells/uL (ref 0–200)
Basophils Relative: 0.7 %
Eosinophils Absolute: 325 cells/uL (ref 15–500)
Eosinophils Relative: 2.9 %
HCT: 28.4 % — ABNORMAL LOW (ref 38.5–50.0)
Hemoglobin: 8.6 g/dL — ABNORMAL LOW (ref 13.2–17.1)
Lymphs Abs: 2688 cells/uL (ref 850–3900)
MCH: 23.7 pg — ABNORMAL LOW (ref 27.0–33.0)
MCHC: 30.3 g/dL — ABNORMAL LOW (ref 32.0–36.0)
MCV: 78.2 fL — ABNORMAL LOW (ref 80.0–100.0)
MPV: 10.4 fL (ref 7.5–12.5)
Monocytes Relative: 5.7 %
Neutro Abs: 7470 cells/uL (ref 1500–7800)
Neutrophils Relative %: 66.7 %
Platelets: 342 10*3/uL (ref 140–400)
RBC: 3.63 10*6/uL — ABNORMAL LOW (ref 4.20–5.80)
RDW: 17.1 % — ABNORMAL HIGH (ref 11.0–15.0)
Total Lymphocyte: 24 %
WBC: 11.2 10*3/uL — ABNORMAL HIGH (ref 3.8–10.8)

## 2020-05-07 LAB — COMPLETE METABOLIC PANEL WITH GFR
AG Ratio: 1.5 (calc) (ref 1.0–2.5)
ALT: 8 U/L — ABNORMAL LOW (ref 9–46)
AST: 11 U/L (ref 10–35)
Albumin: 3.7 g/dL (ref 3.6–5.1)
Alkaline phosphatase (APISO): 46 U/L (ref 35–144)
BUN/Creatinine Ratio: 16 (calc) (ref 6–22)
BUN: 26 mg/dL — ABNORMAL HIGH (ref 7–25)
CO2: 21 mmol/L (ref 20–32)
Calcium: 8.8 mg/dL (ref 8.6–10.3)
Chloride: 105 mmol/L (ref 98–110)
Creat: 1.6 mg/dL — ABNORMAL HIGH (ref 0.70–1.18)
GFR, Est African American: 48 mL/min/{1.73_m2} — ABNORMAL LOW (ref >=60)
GFR, Est Non African American: 41 mL/min/{1.73_m2} — ABNORMAL LOW (ref >=60)
Globulin: 2.5 g/dL (calc) (ref 1.9–3.7)
Glucose, Bld: 212 mg/dL — ABNORMAL HIGH (ref 65–99)
Potassium: 5.2 mmol/L (ref 3.5–5.3)
Sodium: 138 mmol/L (ref 135–146)
Total Bilirubin: 0.2 mg/dL (ref 0.2–1.2)
Total Protein: 6.2 g/dL (ref 6.1–8.1)

## 2020-05-07 NOTE — Progress Notes (Signed)
CBC/CMP stable except for slight decrease in serum creatinine.  Patient started Arava 2 weeks ago.

## 2020-05-08 NOTE — Progress Notes (Signed)
Glucose is elevated.  Creatinine is elevated.  Most likely due to the use of diuretics.  He has persistent anemia.  I will forward labs to Dr. Nani Ravens.

## 2020-05-09 ENCOUNTER — Ambulatory Visit: Payer: Medicare HMO | Admitting: Rheumatology

## 2020-05-15 ENCOUNTER — Telehealth: Payer: Self-pay | Admitting: Rheumatology

## 2020-05-15 ENCOUNTER — Ambulatory Visit (INDEPENDENT_AMBULATORY_CARE_PROVIDER_SITE_OTHER): Payer: Medicare HMO | Admitting: Family Medicine

## 2020-05-15 ENCOUNTER — Other Ambulatory Visit: Payer: Self-pay | Admitting: Family Medicine

## 2020-05-15 ENCOUNTER — Telehealth: Payer: Self-pay | Admitting: Family Medicine

## 2020-05-15 ENCOUNTER — Encounter: Payer: Self-pay | Admitting: Family Medicine

## 2020-05-15 ENCOUNTER — Other Ambulatory Visit: Payer: Self-pay

## 2020-05-15 DIAGNOSIS — M25521 Pain in right elbow: Secondary | ICD-10-CM

## 2020-05-15 MED ORDER — COLCHICINE 0.6 MG PO CAPS: 1.0000 | ORAL_CAPSULE | Freq: Every day | ORAL | 1 refills | Status: DC | PRN

## 2020-05-15 MED ORDER — COLCHICINE 0.6 MG PO TABS: 0.6000 mg | ORAL_TABLET | Freq: Every day | ORAL | 1 refills | Status: DC | PRN

## 2020-05-15 NOTE — Telephone Encounter (Signed)
Please advise 

## 2020-05-15 NOTE — Telephone Encounter (Signed)
Patient's wife Tommy Green called stating Tommy Green's right elbow and right leg are swollen and painful.  Tommy Green states his right elbow "appears to have a big sac of fluid on it."  Tommy Green is requesting a return call ASAP.

## 2020-05-15 NOTE — Telephone Encounter (Signed)
Per Dr. Estanislado Pandy, please see if Dr. Junius Roads will see patient. Contacted Dr. Junius Roads office and spoke with Autumn. She states the patient can be seen as long as he can be to the office prior to 11:00 am. Advised Autumn patient will need cell count, crystals and culture.  Contacted patient and advised patient he can be seen by Dr. Junius Roads. Patient provided with phone number and address.

## 2020-05-15 NOTE — Progress Notes (Signed)
Office Visit Note   Patient: Tommy Green           Date of Birth: [birth year]           MRN: [id number] Visit Date: 05/15/2020 Requested by: Tommy Green, Tommy Green,  [place] [id number] PCP: Tommy Pal, DO  Subjective: Chief Complaint  Patient presents with  . Right Elbow - Pain    HPI: He is here with right elbow swelling.  He noticed swelling on the posterior elbow a few days ago.  It does not hurt.  He has not had fever or chills.  He does have a history of rheumatoid arthritis and lately he has had multiple areas hurt including his hands and his right lower leg.  He has had swelling in those areas as well.  He does have a history of gout many years ago affecting his feet but he has not had any attacks since then.               ROS:   All other systems were reviewed and are negative.  Objective: Vital Signs: There were no vitals taken for this visit.  Physical Exam:  General:  Alert and oriented, in no acute distress. Pulm:  Breathing unlabored. Psy:  Normal mood, congruent affect. Skin: No erythema or warmth. Right elbow: He has 2+ swelling of the olecranon bursa, no intra-articular effusion.  No drainage.  Good range of motion of the elbow, and no tenderness to palpation of the bursa.  Imaging: No results found.  Assessment & Plan: 1.  Right elbow olecranon bursitis, aseptic.  Cannot rule out gout affecting his other joints. -Discussed options with him and elected to aspirate the elbow and send the fluid for crystal analysis, culture and sensitivity. -Prescription for colchicine to use 1 daily as needed, but limit to only a few days due to potential interaction with Mevacor and carvedilol.     Procedures: Right elbow aspiration: After sterile prep with Betadine, injected 2 cc 1% lidocaine without epinephrine, then aspirated 15 cc of bloody synovial fluid.    PMFS History: Patient Active Problem List    Diagnosis Date Noted  . Acute kidney injury superimposed on chronic kidney disease ([facility])   . Respiratory failure ([facility])   . Acute encephalopathy   . Hypernatremia   . Dyspnea   . Acute respiratory failure with hypoxemia ([facility])   . SDH (subdural hematoma) ([facility]) 10/24/2019  . Chronic pain of right knee 10/03/2019  . Cyclic vomiting syndrome 08/06/2019  . Neuropathy 12/25/2018  . Bilateral hip pain 08/24/2018  . AK (actinic keratosis) 08/24/2018  . Neoplasm of uncertain behavior 95/06/3266  . 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 Tendinopathy of right gluteus medius 07/13/2018  . Tendinopathy of left gluteus medius 07/13/2018  . Squamous cell carcinoma in situ (SCCIS) of skin 03/24/2018  . Skin lesion 03/17/2018  . Essential hypertension 07/18/2017  . Hyperlipidemia associated with type 2 diabetes mellitus ([facility]) 07/18/2017  . Type 2 diabetes mellitus with diabetic neuropathy, unspecified ([facility]) 07/18/2017  . Heart murmur 07/18/2017  . Hypertriglyceridemia 07/18/2017  . History of stroke 07/18/2017  . Pain in joint, ankle and foot 02/06/2013   Past Medical History:  Diagnosis Date  . Anemia   . Essential hypertension 07/18/2017  . History of rheumatic fever   . History of stroke 07/18/2017  . Hypertriglyceridemia 07/18/2017  . Type 2 diabetes mellitus with diabetic neuropathy, unspecified ([facility]) 07/18/2017  Family History  Problem Relation Age of Onset  . Hyperlipidemia Mother   . Hyperlipidemia Father   . Alcoholism Brother   . Healthy Son   . Healthy Son   . Cancer Neg Hx     Past Surgical History:  Procedure Laterality Date  . CRANIOTOMY Left 10/25/2019   Procedure: CRANIOTOMY HEMATOMA EVACUATION SUBDURAL;  Surgeon: Vallarie Mare, MD;  Location: [facility];  Service: Neurosurgery;  Laterality: Left;  . FOOT CAPSULOTOMY Left 07/25/2008   Mid Foot #2 MPJ  . Hammertoe Repair Left 07/25/2008   #2 toe  . SPINAL FUSION    . TARSAL TUNNEL RELEASE Left 07/25/2008   Social History    Occupational History  . Not on file  Tobacco Use  . Smoking status: Former Smoker    Types: Cigarettes  . Smokeless tobacco: Never Used  . Tobacco comment: Quit in 1973  Vaping Use  . Vaping Use: Never used  Substance and Sexual Activity  . Alcohol use: No  . Drug use: No  . Sexual activity: Never

## 2020-05-15 NOTE — Telephone Encounter (Signed)
He will need to contact his PCP or maybe Dr. Kathee Delton. There aren't too many safe options with his medications and health conditions.

## 2020-05-15 NOTE — Telephone Encounter (Signed)
Patient's wife [name] called requesting a change of medication. Mrs. [name] said colchicine cost $400.00 and pharmacist recommends prednisone. Please send new prescription to pharmacy on file. Please call patient or his wife when new prescription has been sent in. Patient phone number is [phone].

## 2020-05-16 ENCOUNTER — Emergency Department (HOSPITAL_BASED_OUTPATIENT_CLINIC_OR_DEPARTMENT_OTHER)
Admission: EM | Admit: 2020-05-16 | Discharge: 2020-05-16 | Disposition: A | Discharge status: Home / Self Care | Payer: Medicare HMO | Attending: Emergency Medicine | Admitting: Emergency Medicine

## 2020-05-16 ENCOUNTER — Other Ambulatory Visit: Payer: Self-pay

## 2020-05-16 DIAGNOSIS — Z794 Long term (current) use of insulin: Secondary | ICD-10-CM | POA: Diagnosis not present

## 2020-05-16 DIAGNOSIS — R0902 Hypoxemia: Secondary | ICD-10-CM | POA: Diagnosis not present

## 2020-05-16 DIAGNOSIS — M25561 Pain in right knee: Secondary | ICD-10-CM | POA: Diagnosis not present

## 2020-05-16 DIAGNOSIS — E1140 Type 2 diabetes mellitus with diabetic neuropathy, unspecified: Secondary | ICD-10-CM | POA: Insufficient documentation

## 2020-05-16 DIAGNOSIS — R52 Pain, unspecified: Secondary | ICD-10-CM | POA: Diagnosis not present

## 2020-05-16 DIAGNOSIS — M25562 Pain in left knee: Secondary | ICD-10-CM | POA: Diagnosis not present

## 2020-05-16 DIAGNOSIS — R29810 Facial weakness: Secondary | ICD-10-CM | POA: Diagnosis not present

## 2020-05-16 DIAGNOSIS — R531 Weakness: Secondary | ICD-10-CM | POA: Diagnosis not present

## 2020-05-16 DIAGNOSIS — M25552 Pain in left hip: Secondary | ICD-10-CM | POA: Diagnosis not present

## 2020-05-16 DIAGNOSIS — I10 Essential (primary) hypertension: Secondary | ICD-10-CM | POA: Diagnosis not present

## 2020-05-16 DIAGNOSIS — M2550 Pain in unspecified joint: Secondary | ICD-10-CM

## 2020-05-16 DIAGNOSIS — M25521 Pain in right elbow: Secondary | ICD-10-CM | POA: Insufficient documentation

## 2020-05-16 DIAGNOSIS — Z87891 Personal history of nicotine dependence: Secondary | ICD-10-CM | POA: Insufficient documentation

## 2020-05-16 DIAGNOSIS — M25542 Pain in joints of left hand: Secondary | ICD-10-CM | POA: Diagnosis not present

## 2020-05-16 DIAGNOSIS — Z79899 Other long term (current) drug therapy: Secondary | ICD-10-CM | POA: Diagnosis not present

## 2020-05-16 DIAGNOSIS — M25541 Pain in joints of right hand: Secondary | ICD-10-CM | POA: Insufficient documentation

## 2020-05-16 DIAGNOSIS — M25551 Pain in right hip: Secondary | ICD-10-CM | POA: Diagnosis not present

## 2020-05-16 DIAGNOSIS — M79662 Pain in left lower leg: Secondary | ICD-10-CM | POA: Diagnosis not present

## 2020-05-16 DIAGNOSIS — E1165 Type 2 diabetes mellitus with hyperglycemia: Secondary | ICD-10-CM | POA: Diagnosis not present

## 2020-05-16 DIAGNOSIS — M79622 Pain in left upper arm: Secondary | ICD-10-CM | POA: Diagnosis not present

## 2020-05-16 DIAGNOSIS — M79661 Pain in right lower leg: Secondary | ICD-10-CM | POA: Diagnosis not present

## 2020-05-16 DIAGNOSIS — M79621 Pain in right upper arm: Secondary | ICD-10-CM | POA: Diagnosis not present

## 2020-05-16 DIAGNOSIS — M25522 Pain in left elbow: Secondary | ICD-10-CM | POA: Insufficient documentation

## 2020-05-16 LAB — COMPREHENSIVE METABOLIC PANEL
ALT: 13 U/L (ref 0–44)
AST: 18 U/L (ref 15–41)
Albumin: 3.5 g/dL (ref 3.5–5.0)
Alkaline Phosphatase: 41 U/L (ref 38–126)
Anion gap: 12 (ref 5–15)
BUN: 46 mg/dL — ABNORMAL HIGH (ref 8–23)
CO2: 19 mmol/L — ABNORMAL LOW (ref 22–32)
Calcium: 8.9 mg/dL (ref 8.9–10.3)
Chloride: 103 mmol/L (ref 98–111)
Creatinine, Ser: 1.52 mg/dL — ABNORMAL HIGH (ref 0.61–1.24)
GFR calc Af Amer: 51 mL/min — ABNORMAL LOW (ref >60)
GFR calc non Af Amer: 44 mL/min — ABNORMAL LOW (ref >60)
Glucose, Bld: 230 mg/dL — ABNORMAL HIGH (ref 70–99)
Potassium: 4.5 mmol/L (ref 3.5–5.1)
Sodium: 134 mmol/L — ABNORMAL LOW (ref 135–145)
Total Bilirubin: 0.3 mg/dL (ref 0.3–1.2)
Total Protein: 7 g/dL (ref 6.5–8.1)

## 2020-05-16 LAB — CBC WITH DIFFERENTIAL/PLATELET
Abs Immature Granulocytes: 0.19 10*3/uL — ABNORMAL HIGH (ref 0.00–0.07)
Basophils Absolute: 0.1 10*3/uL (ref 0.0–0.1)
Basophils Relative: 0 %
Eosinophils Absolute: 0.1 10*3/uL (ref 0.0–0.5)
Eosinophils Relative: 1 %
HCT: 29.8 % — ABNORMAL LOW (ref 39.0–52.0)
Hemoglobin: 9.4 g/dL — ABNORMAL LOW (ref 13.0–17.0)
Immature Granulocytes: 1 %
Lymphocytes Relative: 14 %
Lymphs Abs: 2.2 10*3/uL (ref 0.7–4.0)
MCH: 24.3 pg — ABNORMAL LOW (ref 26.0–34.0)
MCHC: 31.5 g/dL (ref 30.0–36.0)
MCV: 77 fL — ABNORMAL LOW (ref 80.0–100.0)
Monocytes Absolute: 1.2 10*3/uL — ABNORMAL HIGH (ref 0.1–1.0)
Monocytes Relative: 7 %
Neutro Abs: 12.1 10*3/uL — ABNORMAL HIGH (ref 1.7–7.7)
Neutrophils Relative %: 77 %
Platelets: 290 10*3/uL (ref 150–400)
RBC: 3.87 MIL/uL — ABNORMAL LOW (ref 4.22–5.81)
RDW: 18.6 % — ABNORMAL HIGH (ref 11.5–15.5)
WBC: 15.8 10*3/uL — ABNORMAL HIGH (ref 4.0–10.5)
nRBC: 0 % (ref 0.0–0.2)

## 2020-05-16 MED ORDER — HYDROCODONE-ACETAMINOPHEN 5-325 MG PO TABS: 0.5000 | ORAL_TABLET | Freq: Four times a day (QID) | ORAL | 0 refills | Status: DC | PRN

## 2020-05-16 MED ORDER — MORPHINE SULFATE (PF) 4 MG/ML IV SOLN
4.0000 mg | Freq: Once | INTRAVENOUS | Status: AC
  Administered 2020-05-16: 4 mg via INTRAVENOUS
  Filled 2020-05-16: qty 1

## 2020-05-16 MED ORDER — ONDANSETRON HCL 4 MG/2ML IJ SOLN
4.0000 mg | Freq: Once | INTRAMUSCULAR | Status: AC
  Administered 2020-05-16: 4 mg via INTRAVENOUS
  Filled 2020-05-16: qty 2

## 2020-05-16 MED ORDER — PREDNISONE 20 MG PO TABS
ORAL_TABLET | ORAL | 0 refills | Status: DC
Start: 2020-05-16 — End: 2020-06-04

## 2020-05-16 MED ORDER — METHYLPREDNISOLONE SODIUM SUCC 125 MG IJ SOLR
125.0000 mg | Freq: Once | INTRAMUSCULAR | Status: AC
  Administered 2020-05-16: 125 mg via INTRAVENOUS
  Filled 2020-05-16: qty 2

## 2020-05-16 MED FILL — predniSONE 20 MG TABS: 20 | 12 days supply | Qty: 20 | Fill #0

## 2020-05-16 MED FILL — HYDROCODON-APAP 5-325: 5-325 | 2 days supply | Qty: 6 | Fill #0

## 2020-05-16 NOTE — ED Triage Notes (Signed)
Arrived ems w leg pain  Unable to walk around at home

## 2020-05-16 NOTE — ED Notes (Signed)
Discharge by another nurse.

## 2020-05-16 NOTE — ED Provider Notes (Signed)
[facility] EMERGENCY DEPARTMENT Provider Note   CSN: [id number] Arrival date & time: 05/16/20  1231     History Chief Complaint  Patient presents with   Generalized Body Aches    Tommy Green is a 74 y.o. male.  Patient with history of diabetes, rheumatoid arthritis followed by rheumatology, recently started Avara (DMARD) about a month ago --presents the emergency department with worsening pain in his joints.  This has progressed to the point where he could not get out of bed this morning.  EMS was called for transport to the hospital.  Patient has had worsening pain in the joints of his hands, elbows, hips, and knees.  He denies fevers or chills.  He has been taking his medications as prescribed.  He has taken Tylenol for pain and yesterday tried ibuprofen, which he typically avoids, without any improvement.  He did see an orthopedic doctor yesterday who drained right elbow bursitis.  No other treatments.  Patient states that in the past, when he has had a steroid course, this helped improve his symptoms.        Past Medical History:  Diagnosis Date   Anemia    Essential hypertension 07/18/2017   History of rheumatic fever    History of stroke 07/18/2017   Hypertriglyceridemia 07/18/2017   Type 2 diabetes mellitus with diabetic neuropathy, unspecified ([facility]) 07/18/2017    Patient Active Problem List   Diagnosis Date Noted   Acute kidney injury superimposed on chronic kidney disease ([facility])    Respiratory failure ([facility] [facility])    Acute encephalopathy    Hypernatremia    Dyspnea    Acute respiratory failure with hypoxemia ([facility])    SDH (subdural hematoma) ([place]) 10/24/2019   Chronic pain of right knee 41/32/4401   Cyclic vomiting syndrome 08/06/2019   Neuropathy 12/25/2018   Bilateral hip pain 08/24/2018   AK (actinic keratosis) 08/24/2018   Neoplasm of uncertain behavior 02/72/5366   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   Tendinopathy of right gluteus  medius 07/13/2018   Tendinopathy of left gluteus medius 07/13/2018   Squamous cell carcinoma in situ (SCCIS) of skin 03/24/2018   Skin lesion 03/17/2018   Essential hypertension 07/18/2017   Hyperlipidemia associated with type 2 diabetes mellitus ([facility]) 07/18/2017   Type 2 diabetes mellitus with diabetic neuropathy, unspecified ([facility]) 07/18/2017   Heart murmur 07/18/2017   Hypertriglyceridemia 07/18/2017   History of stroke 07/18/2017   Pain in joint, ankle and foot 02/06/2013    Past Surgical History:  Procedure Laterality Date   CRANIOTOMY Left 10/25/2019   Procedure: CRANIOTOMY HEMATOMA EVACUATION SUBDURAL;  Surgeon: Vallarie Mare, MD;  Location: [facility];  Service: Neurosurgery;  Laterality: Left;   FOOT CAPSULOTOMY Left 07/25/2008   Mid Foot #2 MPJ   Hammertoe Repair Left 07/25/2008   #2 toe   SPINAL FUSION     TARSAL TUNNEL RELEASE Left 07/25/2008       Family History  Problem Relation Age of Onset   Hyperlipidemia Mother    Hyperlipidemia Father    Alcoholism Brother    Healthy Son    Healthy Son    Cancer Neg Hx     Social History   Tobacco Use   Smoking status: Former Smoker    Types: Cigarettes   Smokeless tobacco: Never Used   Tobacco comment: Quit in 1973  Vaping Use   Vaping Use: Never used  Substance Use Topics   Alcohol use: No   Drug use: No    Home  Medications Prior to Admission medications   Medication Sig Start Date End Date Taking? Authorizing Provider  b complex vitamins capsule Take 1 capsule by mouth daily.   Yes [provider]  carvedilol (COREG) 12.5 MG tablet TAKE 1 TABLET TWICE DAILY  WITH MEALS 12/18/19  Yes Wendling, Crosby Oyster, DO  cetirizine (ZYRTEC) 10 MG tablet Take 1 tablet (10 mg total) by mouth daily. 04/22/20  Yes [name], DO  colchicine 0.6 MG tablet Take 1 tablet (0.6 mg total) by mouth daily as needed. 05/15/20  Yes Hilts, Legrand Como, MD  fenofibrate micronized (LOFIBRA) 200 MG  capsule TAKE 1 CAPSULE DAILY BEFOREBREAKFAST Patient taking differently: Take 200 mg by mouth daily before breakfast.  02/21/19  Yes Wendling, Crosby Oyster, DO  fluticasone (FLONASE) 50 MCG/ACT nasal spray Place 2 sprays into both nostrils daily. 04/22/20  Yes [name], DO  gabapentin (NEURONTIN) 600 MG tablet Take 1 tablet (600 mg total) by mouth 3 (three) times daily. 03/10/20  Yes [name], DO  hydrochlorothiazide (HYDRODIURIL) 25 MG tablet TAKE 1 TABLET DAILY 02/11/20  Yes [name], DO  insulin aspart (NOVOLOG) 100 UNIT/ML injection Inject 0-20 Units into the skin 4 (four) times daily -  before meals and at bedtime. 11/16/19  Yes Pokhrel, Laxman, MD  insulin degludec (TRESIBA) 100 UNIT/ML SOPN FlexTouch Pen Inject 0.6 mLs (60 Units total) into the skin at bedtime. 09/26/19  Yes [name], DO  leflunomide (ARAVA) 10 MG tablet Take 1 tablet (10 mg total) by mouth daily. 04/24/20  Yes Yopp, Amber C, RPH-CPP  lisinopril (ZESTRIL) 40 MG tablet TAKE 1 TABLET DAILY Patient taking differently: Take 40 mg by mouth daily.  02/21/19  Yes [name], DO  lovastatin (MEVACOR) 10 MG tablet Take 1 tablet (10 mg total) by mouth at bedtime. 10/23/19  Yes [name], DO  Magnesium 200 MG TABS Take by mouth daily.   Yes [provider]  metFORMIN (GLUCOPHAGE) 500 MG tablet Take 500 mg by mouth 2 (two) times daily with a meal.   Yes [provider]  Multiple Vitamins-Minerals (MULTIVITAMIN WITH MINERALS) tablet Take 1 tablet by mouth daily.   Yes [provider]  omeprazole (PRILOSEC) 20 MG capsule Take 20 mg by mouth at bedtime. 03/06/20  Yes [provider]  traZODone (DESYREL) 50 MG tablet Take 0.5-1 tablets (25-50 mg total) by mouth at bedtime as needed for sleep. 12/05/19  Yes Wendling, Crosby Oyster, DO  Turmeric 500 MG CAPS Take by mouth daily.   Yes [provider]  docusate sodium (COLACE) 100  MG capsule Take 1 capsule (100 mg total) by mouth daily as needed. 11/16/19   Pokhrel, Corrie Mckusick, MD    Allergies    Influenza vaccines, Aspirin, Atorvastatin, Canagliflozin, and Statins  Review of Systems   Review of Systems  Constitutional: Negative for fever.  HENT: Negative for rhinorrhea and sore throat.   Eyes: Negative for redness.  Respiratory: Negative for cough.   Cardiovascular: Negative for chest pain.  Gastrointestinal: Negative for abdominal pain, diarrhea, nausea and vomiting.  Genitourinary: Negative for dysuria and hematuria.  Musculoskeletal: Positive for arthralgias, gait problem and joint swelling. Negative for myalgias.  Skin: Negative for rash.  Neurological: Negative for headaches.    Physical Exam Updated Vital Signs BP (!) 140/61 (BP Location: Right Arm)    Pulse 96    Temp 99.9 F (37.7 C) (Oral)    Resp 16    Ht 5' 10.5" (  1.791 m)    Wt (!) 113.7 kg    SpO2 97%    BMI 35.45 kg/m   Physical Exam Vitals and nursing note reviewed.  Constitutional:      Appearance: He is well-developed.  HENT:     Head: Normocephalic and atraumatic.  Eyes:     General:        Right eye: No discharge.        Left eye: No discharge.     Conjunctiva/sclera: Conjunctivae normal.  Cardiovascular:     Rate and Rhythm: Normal rate and regular rhythm.     Heart sounds: Normal heart sounds.  Pulmonary:     Effort: Pulmonary effort is normal.     Breath sounds: Normal breath sounds.  Abdominal:     Palpations: Abdomen is soft.     Tenderness: There is no abdominal tenderness.  Musculoskeletal:     Cervical back: Normal range of motion and neck supple.     Comments: Patient has full range of motion of all joints of the upper and lower extremities, actively.  There is a fluid collection over the right olecranon consistent with bursitis.  Patient does have a mild joint effusion of the right knee without erythema or redness.  Skin:    General: Skin is warm and dry.    Neurological:     Mental Status: He is alert.     ED Results / Procedures / Treatments   Labs (all labs ordered are listed, but only abnormal results are displayed) Labs Reviewed  CBC WITH DIFFERENTIAL/PLATELET - Abnormal; Notable for the following components:      Result Value   WBC 15.8 (*)    RBC 3.87 (*)    Hemoglobin 9.4 (*)    HCT 29.8 (*)    MCV 77.0 (*)    MCH 24.3 (*)    RDW 18.6 (*)    Neutro Abs 12.1 (*)    Monocytes Absolute 1.2 (*)    Abs Immature Granulocytes 0.19 (*)    All other components within normal limits  COMPREHENSIVE METABOLIC PANEL - Abnormal; Notable for the following components:   Sodium 134 (*)    CO2 19 (*)    Glucose, Bld 230 (*)    BUN 46 (*)    Creatinine, Ser 1.52 (*)    GFR calc non Af Amer 44 (*)    GFR calc Af Amer 51 (*)    All other components within normal limits    EKG None  Radiology No results found.  Procedures Procedures (including critical care time)  Medications Ordered in ED Medications  morphine 4 MG/ML injection 4 mg (4 mg Intravenous Given 05/16/20 1402)  ondansetron (ZOFRAN) injection 4 mg (4 mg Intravenous Given 05/16/20 1403)  methylPREDNISolone sodium succinate (SOLU-MEDROL) 125 mg/2 mL injection 125 mg (125 mg Intravenous Given 05/16/20 1532)    ED Course  I have reviewed the triage vital signs and the nursing notes.  Pertinent labs & imaging results that were available during my care of the patient were reviewed by me and considered in my medical decision making (see chart for details).  Patient seen and examined. Work-up initiated.  Will treat pain.  Will discuss utility of steroid treatment once renal and blood sugar testing result.  Vital signs reviewed and are as follows: BP (!) 140/61 (BP Location: Right Arm)    Pulse 96    Temp 99.9 F (37.7 C) (Oral)    Resp 16    Ht 5'  10.5" (1.791 m)    Wt (!) 113.7 kg    SpO2 97%    BMI 35.45 kg/m   Patient discussed with and seen by Dr. Sherry Ruffing.  Lab work  reviewed.  Patient is mildly hyperglycemic without evidence of DKA.  White blood cell count is elevated, however no focus of infection and no fever greater than 100.4 F.    Patient improved in the emergency department with pain medication.  He is ambulatory with a walker now with minimal assistance.  He is comfortable with discharged home.  We will give a tapered course of prednisone as well as pain medication.  I had a discussion with patient and his wife regarding precautions with these medications.  Patient checks his blood sugars frequently.  We discussed the risk of elevated blood sugar while on prednisone.  Patient is encouraged to discontinue prednisone and follow-up with his doctor if his blood sugars greater than 400.  He is aware to expect higher than normal blood sugars while on this medication.  In regards to pain medication, we discussed sedative effects and risk of falls.  Patient is to only use this when needed, and the lowest dose needed and under direct supervision of family.     Patient has appointment with his primary care doctor next week.    MDM Rules/Calculators/A&P                          Patient with arthralgias suspected secondary to rheumatoid arthritis for which she is undergoing treatment by rheumatology.  Symptoms have progressed over the past several days to the point where he required ambulance ride to the emergency department today because he cannot get out of bed.  Patient was treated in the emergency department with IV pain medication.  Blood sugars are elevated, however he does keep a close eye on them.  Risks and benefits of treatment with steroids discussed with patient.  He has received benefit from these in the past.  Patient given IV Solu-Medrol here and steroid taper for home.  After discussion with the patient, feel that this is worth a trial, given his diabetes, in order to to keep him out of the hospital.  Patient seems reliable to monitor his blood sugar,  will be in the company of his wife will help keep an eye on things, and seems reliable to return with any complications or worsening.  Patient discussed with and seen by attending physician.  Patient has what appears to be right olecranon bursitis.  The area does not appear to be acutely infected.  He also has a mild joint effusion in the right knee.  I do not feel that he has a septic joint at this point and does not require drainage today.  Patient appears well, nontoxic.    Final Clinical Impression(s) / ED Diagnoses Final diagnoses:  Arthralgia, unspecified joint    Rx / DC Orders ED Discharge Orders         Ordered    predniSONE (DELTASONE) 20 MG tablet     Discontinue  Reprint     05/16/20 1555    HYDROcodone-acetaminophen (NORCO/VICODIN) 5-325 MG tablet  Every 6 hours PRN     Discontinue  Reprint     05/16/20 1555           Carlisle Cater, PA-C 05/16/20 1922    Tegeler, Gwenyth Allegra, MD 05/17/20 [phone]

## 2020-05-16 NOTE — Telephone Encounter (Signed)
Left a full message on the voice mail (full name of patient and wife stated on it). To call us back if any other issues/problems.

## 2020-05-16 NOTE — ED Notes (Signed)
Pt. Ambulated down the hallway with walker, was able to take about 15 steps, turned around under own power, and was able to ambulate back to room. "Felt a little light headed", but no other symptoms.

## 2020-05-16 NOTE — Discharge Instructions (Signed)
Please read and follow all provided instructions.  Your diagnoses today include:  1. Arthralgia, unspecified joint     Tests performed today include:  Blood counts and electrolytes - blood sugar in 200's  Vital signs. See below for your results today.   Medications prescribed:   Prednisone - steroid medicine   It is best to take this medication in the morning to prevent sleeping problems. Monitor your blood sugar closely and stop taking Prednisone if blood sugar is over 400. Take with food to prevent stomach upset.    Vicodin (hydrocodone/acetaminophen) - narcotic pain medication  Use pain medication only under direct supervision at the lowest possible dose needed to control your pain.   DO NOT drive or perform any activities that require you to be awake and alert because this medicine can make you drowsy. BE VERY CAREFUL not to take multiple medicines containing Tylenol (also called acetaminophen). Doing so can lead to an overdose which can damage your liver and cause liver failure and possibly death.  Take any prescribed medications only as directed.  Home care instructions:  Follow any educational materials contained in this packet.  BE VERY CAREFUL not to take multiple medicines containing Tylenol (also called acetaminophen). Doing so can lead to an overdose which can damage your liver and cause liver failure and possibly death.   Follow-up instructions: Please follow-up with your primary care provider in the next 3 days for further evaluation of your symptoms.   Return instructions:   Please return to the Emergency Department if you experience worsening symptoms.   Please return if you have any other emergent concerns.  Additional Information:  Your vital signs today were: BP (!) 135/70 (BP Location: Right Arm)   Pulse 95   Temp 99.9 F (37.7 C) (Oral)   Resp 18   Ht 5' 10.5" (1.791 m)   Wt (!) 113.7 kg   SpO2 97%   BMI 35.45 kg/m  If your blood pressure  (BP) was elevated above 135/85 this visit, please have this repeated by your doctor within one month. --------------

## 2020-05-16 NOTE — ED Notes (Signed)
Pt placed in exam room 8, placed on cont POX monitor and int NBP assessments. Safety measures in place, sr x 2 up, callbell within reach, wife at bedside

## 2020-05-18 ENCOUNTER — Other Ambulatory Visit: Payer: Self-pay | Admitting: Family Medicine

## 2020-05-19 ENCOUNTER — Telehealth: Payer: Self-pay | Admitting: Rheumatology

## 2020-05-19 ENCOUNTER — Other Ambulatory Visit: Payer: Self-pay | Admitting: Rheumatology

## 2020-05-19 DIAGNOSIS — M0579 Rheumatoid arthritis with rheumatoid factor of multiple sites without organ or systems involvement: Secondary | ICD-10-CM

## 2020-05-19 NOTE — Telephone Encounter (Signed)
Spoke with patient and advised prescription was denied as it was to early to refill. A prescription was sent to the pharmacy on 04/24/2020 with one additional refill. Contacted pharmacy and spoke with pharmacist, he advised they have the additional refill and will get it ready for the patient.

## 2020-05-19 NOTE — Telephone Encounter (Signed)
Patient called stating [facility] notified him that Dr. Estanislado Pandy denied his prescription refill of Leflunomide.  Patient states he does have an appointment scheduled with his PCP for 06/04/20.  Patient is requesting a return call.

## 2020-05-20 NOTE — Progress Notes (Signed)
Office Visit Note  Patient: Tommy Green             Date of Birth: Dec 12, 1945           MRN: [id number]             PCP: [name], DO Referring: [name]* Visit Date: 06/03/2020 Occupation: @GUAROCC @  Subjective:  Discuss treatment options   History of Present Illness: Tommy Green is a 74 y.o. male with history of seropositive rheumatoid arthritis and osteoarthritis.  Patient was transported to the ED by ambulance on 05/16/2020 experiencing a rheumatoid arthritis flare.  He was having severe pain in multiple joints and was given IV Solu-Medrol and sent home with a prednisone taper.  He was started on arava 10 mg 1 tablet daily at the beginning of July 2021 and has noticed very minimal improvement but is tolerating it without any side effects.  He continues to have intermittent pain in both hip joints, both knees, and both hands.  He has been using ice topically as needed for pain relief and continues to take Tylenol as needed.  Activities of Daily Living:  Patient reports morning stiffness for 2-3 hours.   Patient Denies nocturnal pain.  Difficulty dressing/grooming: Denies Difficulty climbing stairs: Reports Difficulty getting out of chair: Denies Difficulty using hands for taps, buttons, cutlery, and/or writing: Denies  Review of Systems  Constitutional: Negative for fatigue.  HENT: Positive for mouth dryness. Negative for mouth sores and nose dryness.   Eyes: Negative for itching and dryness.  Respiratory: Negative for shortness of breath and difficulty breathing.   Cardiovascular: Negative for chest pain and palpitations.  Gastrointestinal: Negative for blood in stool, constipation and diarrhea.  Endocrine: Negative for increased urination.  Genitourinary: Negative for difficulty urinating.  Musculoskeletal: Positive for morning stiffness. Negative for arthralgias, joint pain, joint swelling, myalgias, muscle tenderness and myalgias.    Skin: Negative for color change, rash and redness.  Allergic/Immunologic: Negative for susceptible to infections.  Neurological: Positive for headaches. Negative for dizziness, numbness, memory loss and weakness.  Hematological: Negative for bruising/bleeding tendency.  Psychiatric/Behavioral: Negative for confusion.    PMFS History:  Patient Active Problem List   Diagnosis Date Noted  . Acute kidney injury superimposed on chronic kidney disease ([place])   . Respiratory failure ([place])   . Acute encephalopathy   . Hypernatremia   . Dyspnea   . Acute respiratory failure with hypoxemia ([place])   . SDH (subdural hematoma) ([place]) 10/24/2019  . Chronic pain of right knee 10/03/2019  . Cyclic vomiting syndrome 08/06/2019  . Neuropathy 12/25/2018  . Bilateral hip pain 08/24/2018  . AK (actinic keratosis) 08/24/2018  . Neoplasm of uncertain behavior 08/03/5101  . 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 Tendinopathy of right gluteus medius 07/13/2018  . Tendinopathy of left gluteus medius 07/13/2018  . Squamous cell carcinoma in situ (SCCIS) of skin 03/24/2018  . Skin lesion 03/17/2018  . Essential hypertension 07/18/2017  . Hyperlipidemia associated with type 2 diabetes mellitus ([place]) 07/18/2017  . Type 2 diabetes mellitus with diabetic neuropathy, unspecified ([place]) 07/18/2017  . Heart murmur 07/18/2017  . Hypertriglyceridemia 07/18/2017  . History of stroke 07/18/2017  . Pain in joint, ankle and foot 02/06/2013    Past Medical History:  Diagnosis Date  . Anemia   . Essential hypertension 07/18/2017  . History of rheumatic fever   . History of stroke 07/18/2017  . Hypertriglyceridemia 07/18/2017  . Type 2 diabetes mellitus with diabetic neuropathy,  unspecified ([place]) 07/18/2017    Family History  Problem Relation Age of Onset  . Hyperlipidemia Mother   . Hyperlipidemia Father   . Alcoholism Brother   . Healthy Son   . Healthy Son   . Cancer Neg Hx    Past Surgical History:  Procedure  Laterality Date  . CRANIOTOMY Left 10/25/2019   Procedure: CRANIOTOMY HEMATOMA EVACUATION SUBDURAL;  Surgeon: Vallarie Mare, MD;  Location: [facility];  Service: Neurosurgery;  Laterality: Left;  . FOOT CAPSULOTOMY Left 07/25/2008   Mid Foot #2 MPJ  . Hammertoe Repair Left 07/25/2008   #2 toe  . SPINAL FUSION    . TARSAL TUNNEL RELEASE Left 07/25/2008   Social History   Social History Narrative  . Not on file   Immunization History  Administered Date(s) Administered  . Moderna SARS-COVID-2 Vaccination 12/11/2019, 01/10/2020  . Td 12/25/2018     Objective: Vital Signs: BP 125/79 (BP Location: Left Arm, Patient Position: Sitting, Cuff Size: Normal)   Pulse 77   Resp 17   Ht 5\' 10"  (1.778 m)   Wt 255 lb 3.2 oz (115.8 kg)   BMI 36.62 kg/m    Physical Exam Vitals and nursing note reviewed.  Constitutional:      Appearance: He is well-developed.  HENT:     Head: Normocephalic and atraumatic.  Eyes:     Conjunctiva/sclera: Conjunctivae normal.     Pupils: Pupils are equal, round, and reactive to light.  Pulmonary:     Effort: Pulmonary effort is normal.  Abdominal:     Palpations: Abdomen is soft.  Musculoskeletal:     Cervical back: Normal range of motion and neck supple.  Skin:    General: Skin is warm and dry.     Capillary Refill: Capillary refill takes less than 2 seconds.  Neurological:     Mental Status: He is alert and oriented to person, place, and time.  Psychiatric:        Behavior: Behavior normal.      Musculoskeletal Exam: C-spine limited range of motion.  Thoracic kyphosis noted.  Limited range of motion of the lumbar spine.  Shoulder joints have good range of motion with crepitus bilaterally.  Elbow joints have good range of motion.  Mild olecranon bursitis of the right elbow noted.  Wrist joints have good range of motion with no tenderness or inflammation.  He has incomplete fist formation bilaterally.  PIP and DIP thickening consistent with osteoarthritis  of both hands noted.  No synovitis of MCP joints.  Hip joints have limited and painful range of motion.  Inflammation of the right knee joint noted.  He has bilateral knee crepitus.  Ankle joints have good range of motion with no tenderness.  Pedal edema noted bilaterally, right greater than left.  CDAI Exam: CDAI Score: 1.2  Patient Global: 6 mm; Provider Global: 6 mm Swollen: 0 ; Tender: 0  Joint Exam 06/03/2020   No joint exam has been documented for this visit   There is currently no information documented on the homunculus. Go to the Rheumatology activity and complete the homunculus joint exam.  Investigation: No additional findings.  Imaging: No results found.  Recent Labs: Lab Results  Component Value Date   WBC 15.8 (H) 05/16/2020   HGB 9.4 (L) 05/16/2020   PLT 290 05/16/2020   NA 134 (L) 05/16/2020   K 4.5 05/16/2020   CL 103 05/16/2020   CO2 19 (L) 05/16/2020   GLUCOSE 230 (H) 05/16/2020  BUN 46 (H) 05/16/2020   CREATININE 1.52 (H) 05/16/2020   BILITOT 0.3 05/16/2020   ALKPHOS 41 05/16/2020   AST 18 05/16/2020   ALT 13 05/16/2020   PROT 7.0 05/16/2020   ALBUMIN 3.5 05/16/2020   CALCIUM 8.9 05/16/2020   GFRAA 51 (L) 05/16/2020   QFTBGOLDPLUS NEGATIVE 04/17/2020    Speciality Comments: No specialty comments available.  Procedures:  No procedures performed Allergies: Influenza vaccines, Aspirin, Atorvastatin, Canagliflozin, and Statins   Assessment / Plan:     Visit Diagnoses: Rheumatoid arthritis involving multiple sites with positive rheumatoid factor (HCC) - Positive RF, positive anti-CCP, elevated sedimentation rate, history of inflammatory arthritis: He continues to have recurrent flares of rheumatoid arthritis.  He presented to the emergency department via ambulance on 05/16/2020 having a flare in multiple joints.  At that time he was having severe pain and swelling in both hands and he was having difficulty getting out of bed.  He was started on Arava 10  mg 1 tablet by mouth daily at the beginning of July 2021.  He has been tolerating Arava without any side effects.  He has noticed very minimal improvement since starting on Arava.  He is currently having pain in both knees and both hips.  Yesterday he was experiencing pain and swelling in his left hand and had to ice it for 2 hours.  He continues to take Tylenol as needed for pain relief.  We discussed different treatment options today.  We will apply for Enbrel 50 mg subcutaneous injections once weekly through his insurance and once approved he will return to the office for the ministration of the first injection.  Indications, contraindications, potential side effects of Enbrel were discussed today.  All questions were addressed and consent was obtained.  He will continue taking Arava 10 mg 1 tablet by mouth daily.  He will require updated lab work in 1 month and every 3 months.  He will follow-up in the office in 6 to 8 weeks to assess his response.  Medication counseling:   TB Test: negative on 04/17/20 Hepatitis panel:negative on 04/17/20 HIV: negative on 04/17/20 SPEP: IFE did not reveal any monoclonal proteins on 04/17/2020 Immunoglobulins: IgM borderline low 41 rest of panel within normal limits on 04/17/2020.  Chest x-ray: 11/05/19  Does patient have diagnosis of heart failure?  No  Counseled patient that Enbrel is a TNF blocking agent.  Reviewed Enbrel dose of 50 mg once weekly.  Counseled patient on purpose, proper use, and adverse effects of Enbrel.  Reviewed the most common adverse effects including infections, headache, and injection site reactions. Discussed that there is the possibility of an increased risk of malignancy but it is not well understood if this increased risk is due to the medication or the disease state.  Advised patient to get yearly dermatology exams due to risk of skin cancer.  Reviewed the importance of regular labs while on Enbrel therapy.  Advised patient to get standing labs  one month after starting Enbrel then every 2 months.  Provided patient with standing lab orders.  Counseled patient that Enbrel should be held prior to scheduled surgery.  Counseled patient to avoid live vaccines while on Enbrel.  Advised patient to get annual influenza vaccine and the pneumococcal vaccine as needed.  Provided patient with medication education material and answered all questions.  Patient voiced understanding.  Patient consented to Enbrel.  Will upload consent into the media tab.  Reviewed storage instructions for Enbrel.  Advised initial injection  must be administered in office.  Patient voiced understanding.    High risk medication use - Arava 10 mg 1 tablet by mouth daily.  CBC and CMP were drawn on 05/16/2020 and were reviewed today in the office.  He has had all necessary baseline immunosuppressive labs performed on 04/17/2020.  We will be applying for Enbrel 50 mg subcutaneous injections once weekly through his insurance and once approved she will return to the office for the ministration of the first injection.  He will return for lab work in 1 month and every 3 months to monitor for drug toxicity.  He has received both letter no vaccinations.  Contracture of right elbow: Unchanged.  No tenderness to palpation.   Olecranon bursitis, right elbow: He has mild olecranon bursitis of the right elbow and was referred to Dr. Junius Roads. He had an aspiration performed on 05/15/2020 with synovial analysis performed  No crystals were found and white blood cell count was 2554 neutrophils were 62.  Culture negative.  His discomfort and inflammation has improved significantly.  No erythema noted concerning for septic olecranon bursitis.     Primary osteoarthritis of both hips - Moderate to severe osteoarthritis of both hips.  He has painful limited range of motion of both hip joints.  He is currently having discomfort in the left hip.  Primary osteoarthritis of left knee - Mild osteoarthritis.   Experiences intermittent discomfort in the left knee.  No warmth or effusion was noted on exam.  He has left knee crepitus.  Chronic SI joint pain - X-rays were unremarkable.  DDD (degenerative disc disease), lumbar - L4-L5 fusion by Dr.Cohen.  Facet joint arthropathy.  Other medical conditions are listed as follows:   Hyperlipidemia associated with type 2 diabetes mellitus ([place])  Essential hypertension  History of stroke - At age 49 with left-sided weakness  Heart murmur  Type 2 diabetes mellitus with diabetic neuropathy, with long-term current use of insulin (HCC)  Granuloma annulare  Squamous cell carcinoma in situ (SCCIS) of skin  SDH (subdural hematoma) ([place])  Orders: No orders of the defined types were placed in this encounter.  No orders of the defined types were placed in this encounter.   Face-to-face time spent with patient was 30 minutes. Greater than 50% of time was spent in counseling and coordination of care.  Follow-Up Instructions: Return in about 5 months (around 11/03/2020) for Rheumatoid arthritis, Osteoarthritis.   [name], PA-C  Note - This record has been created using Dragon software.  Chart creation errors have been sought, but may not always  have been located. Such creation errors do not reflect on  the standard of medical care.

## 2020-05-21 ENCOUNTER — Telehealth: Payer: Self-pay | Admitting: Family Medicine

## 2020-05-21 LAB — ANAEROBIC AND AEROBIC CULTURE
AER RESULT:: NO GROWTH
GRAM STAIN:: NONE SEEN
MICRO NUMBER:: 10765283
MICRO NUMBER:: 10765284
SPECIMEN QUALITY:: ADEQUATE
SPECIMEN QUALITY:: ADEQUATE

## 2020-05-21 LAB — SYNOVIAL CELL COUNT + DIFF, W/ CRYSTALS
Basophils, %: 0 %
Eosinophils-Synovial: 2 % (ref 0–2)
Lymphocytes-Synovial Fld: 20 % (ref 0–74)
Monocyte/Macrophage: 16 % (ref 0–69)
Neutrophil, Synovial: 62 % — ABNORMAL HIGH (ref 0–24)
Synoviocytes, %: 0 % (ref 0–15)
WBC, Synovial: 2554 cells/uL — ABNORMAL HIGH (ref <150)

## 2020-05-22 NOTE — Telephone Encounter (Signed)
No sign of infection or crystals in the elbow fluid.

## 2020-05-22 NOTE — Telephone Encounter (Signed)
I called and gave the results to the patient's wife.  He could not take the colchicine - "it tore him up." She said he is feeling better now, after taking prednisone given at the ER.

## 2020-06-03 ENCOUNTER — Ambulatory Visit: Payer: Medicare HMO | Admitting: Physician Assistant

## 2020-06-03 ENCOUNTER — Telehealth: Payer: Self-pay | Admitting: Pharmacist

## 2020-06-03 ENCOUNTER — Encounter: Payer: Self-pay | Admitting: Physician Assistant

## 2020-06-03 ENCOUNTER — Other Ambulatory Visit: Payer: Self-pay

## 2020-06-03 VITALS — BP 125/79 | HR 77 | Resp 17 | Ht 70.0 in | Wt 255.2 lb

## 2020-06-03 DIAGNOSIS — M160 Bilateral primary osteoarthritis of hip: Secondary | ICD-10-CM

## 2020-06-03 DIAGNOSIS — M24521 Contracture, right elbow: Secondary | ICD-10-CM

## 2020-06-03 DIAGNOSIS — I10 Essential (primary) hypertension: Secondary | ICD-10-CM

## 2020-06-03 DIAGNOSIS — M5136 Other intervertebral disc degeneration, lumbar region: Secondary | ICD-10-CM | POA: Diagnosis not present

## 2020-06-03 DIAGNOSIS — E1140 Type 2 diabetes mellitus with diabetic neuropathy, unspecified: Secondary | ICD-10-CM

## 2020-06-03 DIAGNOSIS — M1712 Unilateral primary osteoarthritis, left knee: Secondary | ICD-10-CM

## 2020-06-03 DIAGNOSIS — D049 Carcinoma in situ of skin, unspecified: Secondary | ICD-10-CM

## 2020-06-03 DIAGNOSIS — E785 Hyperlipidemia, unspecified: Secondary | ICD-10-CM

## 2020-06-03 DIAGNOSIS — S065X9A Traumatic subdural hemorrhage with loss of consciousness of unspecified duration, initial encounter: Secondary | ICD-10-CM

## 2020-06-03 DIAGNOSIS — Z794 Long term (current) use of insulin: Secondary | ICD-10-CM

## 2020-06-03 DIAGNOSIS — G8929 Other chronic pain: Secondary | ICD-10-CM

## 2020-06-03 DIAGNOSIS — E1169 Type 2 diabetes mellitus with other specified complication: Secondary | ICD-10-CM

## 2020-06-03 DIAGNOSIS — M533 Sacrococcygeal disorders, not elsewhere classified: Secondary | ICD-10-CM

## 2020-06-03 DIAGNOSIS — M7021 Olecranon bursitis, right elbow: Secondary | ICD-10-CM

## 2020-06-03 DIAGNOSIS — Z79899 Other long term (current) drug therapy: Secondary | ICD-10-CM | POA: Diagnosis not present

## 2020-06-03 DIAGNOSIS — M0579 Rheumatoid arthritis with rheumatoid factor of multiple sites without organ or systems involvement: Secondary | ICD-10-CM

## 2020-06-03 DIAGNOSIS — L920 Granuloma annulare: Secondary | ICD-10-CM

## 2020-06-03 DIAGNOSIS — S065XAA Traumatic subdural hemorrhage with loss of consciousness status unknown, initial encounter: Secondary | ICD-10-CM

## 2020-06-03 DIAGNOSIS — R011 Cardiac murmur, unspecified: Secondary | ICD-10-CM

## 2020-06-03 DIAGNOSIS — Z8673 Personal history of transient ischemic attack (TIA), and cerebral infarction without residual deficits: Secondary | ICD-10-CM

## 2020-06-03 NOTE — Telephone Encounter (Signed)
Please start benefits investigation for Enbrel for the treatment of RA. He has tried [place] with inadequate response. He is not a candidate for methotrexate due to CKD.   Mariella Saa, PharmD, [place], CPP Clinical Specialty Pharmacist (Rheumatology and Pulmonology)  06/03/2020 11:22 AM

## 2020-06-03 NOTE — Patient Instructions (Signed)
Standing Labs We placed an order today for your standing lab work.   Please have your standing labs drawn in 1 month then every 3 months   If possible, please have your labs drawn 2 weeks prior to your appointment so that the provider can discuss your results at your appointment.  We have open lab daily Monday through Thursday from 8:30-12:30 PM and 1:30-4:30 PM and Friday from 8:30-12:30 PM and 1:30-4:00 PM at the office of Dr. Bo Merino, [facility].   Please be advised, patients with office appointments requiring lab work will take precedents over walk-in lab work.  If possible, please come for your lab work on Monday and Friday afternoons, as you may experience shorter wait times. The office is located at [address], [place], [place], [place] [address] No appointment is necessary.   Labs are drawn by [facility]. Please bring your co-pay at the time of your lab draw.  You may receive a bill from [facility] for your lab work.  If you wish to have your labs drawn at another location, please call the office 24 hours in advance to send orders.  If you have any questions regarding directions or hours of operation,  please call [phone].   As a reminder, please drink plenty of water prior to coming for your lab work. Thanks!   COVID-19 vaccine recommendations:   COVID-19 vaccine is recommended for everyone (unless you are allergic to a vaccine component), even if you are on a medication that suppresses your immune system.   If you are on Methotrexate, Cellcept (mycophenolate), Rinvoq, Morrie Sheldon, and Olumiant- hold the medication for 1 week after each vaccine. Hold Methotrexate for 2 weeks after the single dose COVID-19 vaccine.   If you are on Orencia subcutaneous injection - hold medication one week prior to and one week after the first COVID-19 vaccine dose (only).   If you are on Orencia IV infusions- time vaccination administration so that the first COVID-19  vaccination will occur four weeks after the infusion and postpone the subsequent infusion by one week.   If you are on Cyclophosphamide or Rituxan infusions please contact your doctor prior to receiving the COVID-19 vaccine.   Do not take Tylenol or ant anti-inflammatory medications (NSAIDs) 24 hours prior to the COVID-19 vaccination.   There is no direct evidence about the efficacy of the COVID-19 vaccine in individuals who are on medications that suppress the immune system.   Even if you are fully vaccinated, and you are on any medications that suppress your immune system, please continue to wear a mask, maintain at least six feet social distance and practice hand hygiene.   If you develop a COVID-19 infection, please contact your PCP or our office to determine if you need antibody infusion.  The booster vaccine is now available for immunocompromised patients. It is advised that if you had Pfizer vaccine you should get Coca-Cola booster.  If you had a Moderna vaccine then you should get a Moderna booster. Johnson and Wynetta Emery does not have a booster vaccine at this time.  Please see the following web sites for updated information.   [email or web address]://[email or web address]/[email or web address]-Vaccination-Patient-Resources.[email or web address]  [email or web address]://[email or web address]/About-Us/[email or web address]-Releases/ID/1159

## 2020-06-03 NOTE — Telephone Encounter (Signed)
Submitted a Prior Authorization request to [employer] for ENBREL via Cover My Meds. Will update once we receive a response.   ([place]) - [id number]

## 2020-06-03 NOTE — Progress Notes (Signed)
Pharmacy Note  Subjective: Patient presents today to the [facility] for follow up office visit.  Patient seen by the pharmacist for counseling on Enbrel for rheumatoid arthritis.  He had inadequate response to [place] and is not a candidate for methotrexate due to renal impairment.  Objective:  CBC    Component Value Date/Time   WBC 15.8 (H) 05/16/2020 1356   RBC 3.87 (L) 05/16/2020 1356   HGB 9.4 (L) 05/16/2020 1356   HGB 11.6 (L) 04/15/2006 0905   HCT 29.8 (L) 05/16/2020 1356   HCT 33.9 (L) 04/15/2006 0905   PLT 290 05/16/2020 1356   PLT 422 (H) 04/15/2006 0905   MCV 77.0 (L) 05/16/2020 1356   MCV 86.9 04/15/2006 0905   MCH 24.3 (L) 05/16/2020 1356   MCHC 31.5 05/16/2020 1356   RDW 18.6 (H) 05/16/2020 1356   RDW 14.6 04/15/2006 0905   LYMPHSABS 2.2 05/16/2020 1356   LYMPHSABS 2.1 04/15/2006 0905   MONOABS 1.2 (H) 05/16/2020 1356   MONOABS 0.5 04/15/2006 0905   EOSABS 0.1 05/16/2020 1356   EOSABS 0.3 04/15/2006 0905   BASOSABS 0.1 05/16/2020 1356   BASOSABS 0.1 04/15/2006 0905     CMP     Component Value Date/Time   NA 134 (L) 05/16/2020 1356   K 4.5 05/16/2020 1356   CL 103 05/16/2020 1356   CO2 19 (L) 05/16/2020 1356   GLUCOSE 230 (H) 05/16/2020 1356   BUN 46 (H) 05/16/2020 1356   CREATININE 1.52 (H) 05/16/2020 1356   CREATININE 1.60 (H) 05/06/2020 1043   CALCIUM 8.9 05/16/2020 1356   PROT 7.0 05/16/2020 1356   ALBUMIN 3.5 05/16/2020 1356   AST 18 05/16/2020 1356   ALT 13 05/16/2020 1356   ALKPHOS 41 05/16/2020 1356   BILITOT 0.3 05/16/2020 1356   GFRNONAA 44 (L) 05/16/2020 1356   GFRNONAA 41 (L) 05/06/2020 1043   GFRAA 51 (L) 05/16/2020 1356   GFRAA 48 (L) 05/06/2020 1043     Baseline Immunosuppressant Therapy Labs TB GOLD Quantiferon TB Gold Latest Ref Rng & Units 04/17/2020  Quantiferon TB Gold Plus NEGATIVE NEGATIVE   Hepatitis Panel Hepatitis Latest Ref Rng & Units 04/17/2020  Hep B Surface Ag NON-REACTI NON-REACTIVE  Hep B IgM  NON-REACTI NON-REACTIVE  Hep C Ab NON-REACTI NON-REACTIVE  Hep C Ab NON-REACTI NON-REACTIVE   HIV Lab Results  Component Value Date   HIV NON-REACTIVE 04/17/2020   Immunoglobulins Immunoglobulin Electrophoresis Latest Ref Rng & Units 04/17/2020  IgA  70 - 320 mg/dL 146  IgG 600 - 1,540 mg/dL 832  IgM 50 - 300 mg/dL 41(L)   SPEP Serum Protein Electrophoresis Latest Ref Rng & Units 05/16/2020  Total Protein 6.5 - 8.1 g/dL 7.0  Albumin 3.8 - 4.8 g/dL -  Alpha-1 0.2 - 0.3 g/dL -  Alpha-2 0.5 - 0.9 g/dL -  Beta Globulin 0.4 - 0.6 g/dL -  Beta 2 0.2 - 0.5 g/dL -  Gamma Globulin 0.8 - 1.7 g/dL -   G6PD Lab Results  Component Value Date   G6PDH 19.9 04/17/2020   TPMT Lab Results  Component Value Date   TPMT 16 04/17/2020     Chest x-ray: no active disease 02/18/2006  Does patient have diagnosis of heart failure?  No  Assessment/Plan:  Counseled patient that Enbrel is a TNF blocking agent. Counseled patient on purpose, proper use, and adverse effects of Enbrel.  Reviewed the most common adverse effects including infections, headache, and injection site reactions. Discussed that there  is the possibility of an increased risk of malignancy but it is not well understood if this increased risk is due to the medication or the disease state.  Advised patient to get yearly dermatology exams due to risk of skin cancer.  Counseled patient that Enbrel should be held prior to scheduled surgery.  Counseled patient to avoid live vaccines while on Enbrel. Recommend annual influenza, Pneumovax 23, Prevnar 13, and Shingrix as indicated.   Reviewed the importance of regular labs while on Enbrel therapy.  Advised patient to get standing labs one month after starting Enbrel then every 2 months.  Provided patient with standing lab orders.  Provided patient with medication education material and answered all questions.  Patient voiced understanding.  Patient consented to Enbrel.  Will upload consent into the  media tab.  Reviewed storage instructions for Enbrel.  Advised initial injection must be administered in office.  Patient voiced understanding.    Patient dose will be Enbrel 50 mg every 7 days.  Prescription pending labs and/or insurance.  Mariella Saa, PharmD, [place], CPP Clinical Specialty Pharmacist (Rheumatology and Pulmonology)  06/03/2020 11:02 AM

## 2020-06-03 NOTE — Telephone Encounter (Signed)
Received notification from [facility] regarding a prior authorization for ENBREL. Authorization has been APPROVED from 10/19/19 to 10/17/20.   Authorization #  [id number]   Ran test claim, patient's copay for 1 month supply is $1,843.08. Patient will need to apply for [employer] PAP.

## 2020-06-04 ENCOUNTER — Other Ambulatory Visit: Payer: Self-pay

## 2020-06-04 ENCOUNTER — Encounter: Payer: Self-pay | Admitting: Family Medicine

## 2020-06-04 ENCOUNTER — Ambulatory Visit (INDEPENDENT_AMBULATORY_CARE_PROVIDER_SITE_OTHER): Payer: Medicare HMO | Admitting: Family Medicine

## 2020-06-04 VITALS — BP 128/80 | HR 87 | Temp 98.2°F | Ht 70.0 in | Wt 254.0 lb

## 2020-06-04 DIAGNOSIS — D509 Iron deficiency anemia, unspecified: Secondary | ICD-10-CM | POA: Diagnosis not present

## 2020-06-04 DIAGNOSIS — E1140 Type 2 diabetes mellitus with diabetic neuropathy, unspecified: Secondary | ICD-10-CM | POA: Diagnosis not present

## 2020-06-04 DIAGNOSIS — Z794 Long term (current) use of insulin: Secondary | ICD-10-CM

## 2020-06-04 LAB — LIPID PANEL
Cholesterol: 145 mg/dL (ref 0–200)
HDL: 24.2 mg/dL — ABNORMAL LOW (ref >39.00)
Total CHOL/HDL Ratio: 6
Triglycerides: 409 mg/dL — ABNORMAL HIGH (ref 0.0–149.0)

## 2020-06-04 LAB — IBC + FERRITIN
Ferritin: 50.7 ng/mL (ref 22.0–322.0)
Iron: 28 ug/dL — ABNORMAL LOW (ref 42–165)
Saturation Ratios: 7.4 % — ABNORMAL LOW (ref 20.0–50.0)
Transferrin: 270 mg/dL (ref 212.0–360.0)

## 2020-06-04 LAB — HEMOGLOBIN A1C: Hgb A1c MFr Bld: 8.2 % — ABNORMAL HIGH (ref 4.6–6.5)

## 2020-06-04 LAB — LDL CHOLESTEROL, DIRECT: Direct LDL: 69 mg/dL

## 2020-06-04 NOTE — Progress Notes (Signed)
Subjective:   Chief Complaint  Patient presents with   Follow-up    Tommy Green is a 74 y.o. male here for follow-up of diabetes.   Yeray's self monitored glucose range is low 100's.  Patient denies hypoglycemic reactions. He checks his glucose levels 3 times per day. Patient does require insulin.  Tresiba 60 u/day, Novolog sliding scale w meals.  Medications include: metformin 500 mg bid Diet is fair.  Exercise: tries walking  Anemia persists after hospitalization. Rec'd to follow up here. No blood in stool or urine.   Past Medical History:  Diagnosis Date   Anemia    Essential hypertension 07/18/2017   History of rheumatic fever    History of stroke 07/18/2017   Hypertriglyceridemia 07/18/2017   Type 2 diabetes mellitus with diabetic neuropathy, unspecified ([place]) 07/18/2017     Related testing: Date of retinal exam: Done Pneumovax: done  Objective:  BP 128/80 (BP Location: Left Arm, Patient Position: Sitting, Cuff Size: Normal)    Pulse 87    Temp 98.2 F (36.8 C) (Oral)    Ht 5\' 10"  (1.778 m)    Wt 254 lb (115.2 kg)    SpO2 97%    BMI 36.45 kg/m  General:  Well developed, well nourished, in no apparent distress Lungs:  CTAB, no access msc use Cardio:  RRR, +sem, no LE edema Psych: Age appropriate judgment and insight  Assessment:   Type 2 diabetes mellitus with diabetic neuropathy, with long-term current use of insulin (HCC) - Plan: Lipid panel, Hemoglobin A1c  Microcytic anemia - Plan: IBC + Ferritin, CBC (INCLUDES DIFF/PLT) WITH PATHOLOGIST REVIEW   Plan:     1. Counseled on diet and exercise. Cont meds. Sounds like he is controlled. 2. Ck above. May be anemia of chronic disease.  F/u in in 3-6 mo. The patient voiced understanding and agreement to the plan.  [place], DO 06/04/20 10:58 AM

## 2020-06-04 NOTE — Telephone Encounter (Signed)
Called to updated patient about approval and will need to proceed with PAP.  Spoke with wife and she verbalized understanding.  Stated they will drop off application later this afternoon or tomorrow.  Advised it is about a week turnaround for PAP determination and once approved will schedule new start appointment.  Wife verbalized understanding.   Mariella Saa, PharmD, [place], CPP Clinical Specialty Pharmacist (Rheumatology and Pulmonology)  06/04/2020 8:53 AM

## 2020-06-04 NOTE — Patient Instructions (Addendum)
Give [place] 2-3 business days to get the results of your labs back.   Keep the diet clean and stay active.  I recommend getting the flu shot in mid October. This suggestion would change if the CDC comes out with a different recommendation.   The new Shingrix vaccine (for shingles) is a 2 shot series. It can make people feel low energy, achy and almost like they have the flu for 48 hours after injection. Please plan accordingly when deciding on when to get this shot. Call your pharmacy to get this. The second shot of the series is less severe regarding the side effects, but it still lasts 48 hours.    Let us know if you need anything.

## 2020-06-05 LAB — CBC (INCLUDES DIFF/PLT) WITH PATHOLOGIST REVIEW
Absolute Monocytes: 536 cells/uL (ref 200–950)
Basophils Absolute: 52 cells/uL (ref 0–200)
Basophils Relative: 0.5 %
Eosinophils Absolute: 402 cells/uL (ref 15–500)
Eosinophils Relative: 3.9 %
HCT: 30.9 % — ABNORMAL LOW (ref 38.5–50.0)
Hemoglobin: 9.3 g/dL — ABNORMAL LOW (ref 13.2–17.1)
Lymphs Abs: 2101 cells/uL (ref 850–3900)
MCH: 23.9 pg — ABNORMAL LOW (ref 27.0–33.0)
MCHC: 30.1 g/dL — ABNORMAL LOW (ref 32.0–36.0)
MCV: 79.4 fL — ABNORMAL LOW (ref 80.0–100.0)
MPV: 10.6 fL (ref 7.5–12.5)
Monocytes Relative: 5.2 %
Neutro Abs: 7210 cells/uL (ref 1500–7800)
Neutrophils Relative %: 70 %
Platelets: 224 10*3/uL (ref 140–400)
RBC: 3.89 10*6/uL — ABNORMAL LOW (ref 4.20–5.80)
RDW: 18.4 % — ABNORMAL HIGH (ref 11.0–15.0)
Total Lymphocyte: 20.4 %
WBC: 10.3 10*3/uL (ref 3.8–10.8)

## 2020-06-06 ENCOUNTER — Telehealth: Payer: Self-pay | Admitting: Rheumatology

## 2020-06-06 NOTE — Telephone Encounter (Signed)
I called Tommy Green to let him know and he said thank you!

## 2020-06-06 NOTE — Telephone Encounter (Signed)
We did receive his income documents and have faxed application off for approval.  Will update when we receive a response.

## 2020-06-06 NOTE — Telephone Encounter (Signed)
Submitted Patient Assistance Application to [employer] for ENBREL along with provider portion, PA and income documents. Will update patient when we receive a response.  Fax# [phone] Phone# [phone]

## 2020-06-06 NOTE — Telephone Encounter (Signed)
Tommy Green called to confirm you received the verification of income for Doug's application.

## 2020-06-09 ENCOUNTER — Other Ambulatory Visit: Payer: Self-pay | Admitting: Family Medicine

## 2020-06-09 ENCOUNTER — Telehealth: Payer: Self-pay | Admitting: Family Medicine

## 2020-06-09 DIAGNOSIS — E781 Pure hyperglyceridemia: Secondary | ICD-10-CM

## 2020-06-09 DIAGNOSIS — D509 Iron deficiency anemia, unspecified: Secondary | ICD-10-CM

## 2020-06-09 NOTE — Telephone Encounter (Signed)
The wife will pickup urine cup for urine test///patient unable to get to the office.

## 2020-06-09 NOTE — Telephone Encounter (Signed)
Called the patient and they are working on getting patient assistance for his new RA medication through his RA doctor... He has given them a follow-up call this morning.

## 2020-06-09 NOTE — Telephone Encounter (Signed)
Tommy Green is wondering if she is able to come by the office today around 03:30 pm to pick a urine cup and dropped off later instead of husband coming in for lab appointment.

## 2020-06-09 NOTE — Telephone Encounter (Signed)
The patients wife stated the patient is just not doing well today. Having to use his walker again and experiencing so much pain. She would like PCP to be aware and wanted your thoughts on his symptoms.

## 2020-06-09 NOTE — Telephone Encounter (Signed)
My biggest concern would be with his RA. I would reach out to rheum team. If they aren't readily available, let me know. Ty.

## 2020-06-09 NOTE — Telephone Encounter (Signed)
Has been taken care of and spoken to his wife.

## 2020-06-10 ENCOUNTER — Other Ambulatory Visit: Payer: Self-pay | Admitting: Family Medicine

## 2020-06-11 ENCOUNTER — Other Ambulatory Visit: Payer: Medicare HMO

## 2020-06-11 ENCOUNTER — Other Ambulatory Visit: Payer: Self-pay | Admitting: Family Medicine

## 2020-06-11 NOTE — Telephone Encounter (Signed)
I am OK with it, he uses colchicine as needed for flares, not for prophylaxis. Ty.

## 2020-06-11 NOTE — Telephone Encounter (Signed)
Advise on questions from pharmacy

## 2020-06-11 NOTE — Addendum Note (Signed)
Addended by: Kelle Darting A on: 06/11/2020 03:57 PM   Modules accepted: Orders

## 2020-06-12 ENCOUNTER — Other Ambulatory Visit: Payer: Self-pay

## 2020-06-12 ENCOUNTER — Other Ambulatory Visit: Payer: Medicare HMO

## 2020-06-12 DIAGNOSIS — D509 Iron deficiency anemia, unspecified: Secondary | ICD-10-CM

## 2020-06-12 LAB — URINALYSIS, MICROSCOPIC ONLY
Bacteria, UA: NONE SEEN /HPF
Hyaline Cast: NONE SEEN /LPF
RBC / HPF: NONE SEEN /HPF (ref 0–2)
Squamous Epithelial / HPF: NONE SEEN /HPF (ref <=5)
WBC, UA: NONE SEEN /HPF (ref 0–5)

## 2020-06-12 NOTE — Telephone Encounter (Signed)
Called [employer] to check status of application. It is in process. Determination should be coming in the next couple days.

## 2020-06-12 NOTE — Telephone Encounter (Signed)
Received fax from [employer], application has been APPROVED. Coverage is from 06/12/20 to 10/17/20.  Will send document to [facility].  Phone# [phone] Fax# [phone]

## 2020-06-13 ENCOUNTER — Other Ambulatory Visit: Payer: Self-pay | Admitting: Family Medicine

## 2020-06-13 ENCOUNTER — Telehealth: Payer: Self-pay | Admitting: Rheumatology

## 2020-06-13 DIAGNOSIS — D509 Iron deficiency anemia, unspecified: Secondary | ICD-10-CM

## 2020-06-13 NOTE — Telephone Encounter (Signed)
Received fax yesterday stating that patient was approved for patient assistance.  He can be scheduled for his new start appointment.

## 2020-06-13 NOTE — Telephone Encounter (Signed)
Patient called stating he received a call from [place] "asking him a lot of crazy questions and in the middle of the conversation knew he was being set up so he would not qualify for assistance."  Patient states his pain is increasing to the point he needs to use a walker to get around the house.  Patient states "he can't wait much longer and needs to get on a medication soon."  Patient requested a return call.

## 2020-06-13 NOTE — Telephone Encounter (Signed)
Patient is scheduled for 06/16/20 at 9:30 for Enbrel new start

## 2020-06-14 ENCOUNTER — Other Ambulatory Visit: Payer: Self-pay | Admitting: Family Medicine

## 2020-06-14 DIAGNOSIS — R69 Illness, unspecified: Secondary | ICD-10-CM | POA: Diagnosis not present

## 2020-06-16 ENCOUNTER — Other Ambulatory Visit: Payer: Self-pay

## 2020-06-16 ENCOUNTER — Ambulatory Visit (INDEPENDENT_AMBULATORY_CARE_PROVIDER_SITE_OTHER): Payer: Medicare HMO | Admitting: Pharmacist

## 2020-06-16 ENCOUNTER — Telehealth: Payer: Self-pay | Admitting: Rheumatology

## 2020-06-16 VITALS — BP 108/68 | HR 84

## 2020-06-16 DIAGNOSIS — M0579 Rheumatoid arthritis with rheumatoid factor of multiple sites without organ or systems involvement: Secondary | ICD-10-CM | POA: Diagnosis not present

## 2020-06-16 MED ORDER — ENBREL MINI 50 MG/ML ~~LOC~~ SOCT: 50.0000 mg | SUBCUTANEOUS | 0 refills | Status: DC

## 2020-06-16 NOTE — Progress Notes (Signed)
Pharmacy Note  Subjective:   Patient presents to clinic today to receive first dose of Enbrel.  Patient running a fever or have signs/symptoms of infection? No  Patient currently on antibiotics for the treatment of infection? No  Patient have any upcoming invasive procedures/surgeries? No  Objective: CMP     Component Value Date/Time   NA 134 (L) 05/16/2020 1356   K 4.5 05/16/2020 1356   CL 103 05/16/2020 1356   CO2 19 (L) 05/16/2020 1356   GLUCOSE 230 (H) 05/16/2020 1356   BUN 46 (H) 05/16/2020 1356   CREATININE 1.52 (H) 05/16/2020 1356   CREATININE 1.60 (H) 05/06/2020 1043   CALCIUM 8.9 05/16/2020 1356   PROT 7.0 05/16/2020 1356   ALBUMIN 3.5 05/16/2020 1356   AST 18 05/16/2020 1356   ALT 13 05/16/2020 1356   ALKPHOS 41 05/16/2020 1356   BILITOT 0.3 05/16/2020 1356   GFRNONAA 44 (L) 05/16/2020 1356   GFRNONAA 41 (L) 05/06/2020 1043   GFRAA 51 (L) 05/16/2020 1356   GFRAA 48 (L) 05/06/2020 1043    CBC    Component Value Date/Time   WBC 10.3 06/04/2020 1115   RBC 3.89 (L) 06/04/2020 1115   HGB 9.3 (L) 06/04/2020 1115   HGB 11.6 (L) 04/15/2006 0905   HCT 30.9 (L) 06/04/2020 1115   HCT 33.9 (L) 04/15/2006 0905   PLT 224 06/04/2020 1115   PLT 422 (H) 04/15/2006 0905   MCV 79.4 (L) 06/04/2020 1115   MCV 86.9 04/15/2006 0905   MCH 23.9 (L) 06/04/2020 1115   MCHC 30.1 (L) 06/04/2020 1115   RDW 18.4 (H) 06/04/2020 1115   RDW 14.6 04/15/2006 0905   LYMPHSABS 2,101 06/04/2020 1115   LYMPHSABS 2.1 04/15/2006 0905   MONOABS 1.2 (H) 05/16/2020 1356   MONOABS 0.5 04/15/2006 0905   EOSABS 402 06/04/2020 1115   EOSABS 0.3 04/15/2006 0905   BASOSABS 52 06/04/2020 1115   BASOSABS 0.1 04/15/2006 0905    Baseline Immunosuppressant Therapy Labs TB GOLD Quantiferon TB Gold Latest Ref Rng & Units 04/17/2020  Quantiferon TB Gold Plus NEGATIVE NEGATIVE   Hepatitis Panel Hepatitis Latest Ref Rng & Units 04/17/2020  Hep B Surface Ag NON-REACTI NON-REACTIVE  Hep B IgM  NON-REACTI NON-REACTIVE  Hep C Ab NON-REACTI NON-REACTIVE  Hep C Ab NON-REACTI NON-REACTIVE   HIV Lab Results  Component Value Date   HIV NON-REACTIVE 04/17/2020   Immunoglobulins Immunoglobulin Electrophoresis Latest Ref Rng & Units 04/17/2020  IgA  70 - 320 mg/dL 146  IgG 600 - 1,540 mg/dL 832  IgM 50 - 300 mg/dL 41(L)   SPEP Serum Protein Electrophoresis Latest Ref Rng & Units 05/16/2020  Total Protein 6.5 - 8.1 g/dL 7.0  Albumin 3.8 - 4.8 g/dL -  Alpha-1 0.2 - 0.3 g/dL -  Alpha-2 0.5 - 0.9 g/dL -  Beta Globulin 0.4 - 0.6 g/dL -  Beta 2 0.2 - 0.5 g/dL -  Gamma Globulin 0.8 - 1.7 g/dL -   G6PD Lab Results  Component Value Date   G6PDH 19.9 04/17/2020   TPMT Lab Results  Component Value Date   TPMT 16 04/17/2020     Chest x-ray: Bibasilar atelectasis. No active disease. 02/18/2006  Assessment/Plan:  Demonstrated proper injection technique with Enbrel Mini demo pen.  Patient able to demonstrate proper injection technique using the teach back method. Patient self injected in the lower abdomen with:  Sample Medication: Enbrel Mini 50 mg/ml NDC: 03474-259-56 Lot: [id number] Expiration: 01/2022  Patient tolerated well.  Observed  for 30 mins in office for adverse reaction and none noted.   Patient is to return in 6-8 weeks for follow up appointment and 4 weeks for labs. He has follow-up appointment scheduled for 07/15/20. Standing orders placed.   Patient was approved for [facility].  They have not called to set up first shipment.  Patient given a sample with same lot and expiration above for next week's dose.  Patient given number to program to call and set up first shipment.  All questions encouraged and answered.  Instructed patient to call with any further questions or concerns.  Mariella Saa, PharmD, [facility] Clinical Pharmacist  06/16/2020 9:07 AM

## 2020-06-16 NOTE — Patient Instructions (Addendum)
   START Enbrel 50 mg/ml every 7 days  Remember, never give early but can always give late   [employer] [phone]) or [facility] [phone])  to schedule first shipment. Remember the 5 C's: COUNTER- leave on the counter at least 30 mins but up to overnight to bring medication to room temperature and prevent stinging COLD- Placing something cold (like and ice gel pack or cold water bottle) on the injection site just before cleansing with alcohol may help reduce pain CLARITIN- for the first two weeks of treatment or  the day of, the day before, and the day after injecting to minimize injection site reactions CORTISONE CREAM- apply if injection site is irritated and itching CALL ME- if injection site reaction is bigger than the size of your fist, looks infected, blisters, or develop hives  Standing Labs We placed an order today for your standing lab work.   Please have your standing labs drawn in 1 month and then every 3 months.  If possible, please have your labs drawn 2 weeks prior to your appointment so that the provider can discuss your results at your appointment.  We have open lab daily Monday through Thursday from 8:30-12:30 PM and 1:30-4:30 PM and Friday from 8:30-12:30 PM and 1:30-4:00 PM at the office of Dr. Bo Merino, [facility].   Please be advised, patients with office appointments requiring lab work will take precedents over walk-in lab work.  If possible, please come for your lab work on Monday and Friday afternoons, as you may experience shorter wait times. The office is located at [address], [place], [place], [place] [address] No appointment is necessary.   Labs are drawn by [facility]. Please bring your co-pay at the time of your lab draw.  You may receive a bill from [facility] for your lab work.  If you wish to have your labs drawn at another location, please call the office 24 hours in advance to send orders.  If you  have any questions regarding directions or hours of operation,  please call [phone].   As a reminder, please drink plenty of water prior to coming for your lab work. Thanks!

## 2020-06-16 NOTE — Telephone Encounter (Signed)
Spoke with patient's wife [name] who is on dpr and advised that Jarris is to continue the [place] along with the Enbrel. She will advise patient.

## 2020-06-16 NOTE — Telephone Encounter (Signed)
Patient left a voicemail requesting a return call to let him know if he needs to continue taking his Leflunomide with Enbrel.

## 2020-06-17 ENCOUNTER — Encounter: Payer: Self-pay | Admitting: Gastroenterology

## 2020-06-24 ENCOUNTER — Other Ambulatory Visit: Payer: Self-pay

## 2020-06-24 ENCOUNTER — Ambulatory Visit (INDEPENDENT_AMBULATORY_CARE_PROVIDER_SITE_OTHER): Payer: Medicare HMO | Admitting: Family Medicine

## 2020-06-24 ENCOUNTER — Encounter: Payer: Self-pay | Admitting: Family Medicine

## 2020-06-24 DIAGNOSIS — M25521 Pain in right elbow: Secondary | ICD-10-CM | POA: Diagnosis not present

## 2020-06-24 NOTE — Progress Notes (Signed)
Office Visit Note   Patient: Tommy Green           Date of Birth: [birth year]           MRN: [id number] Visit Date: 06/24/2020 Requested by: [name], [address] [place],  [place] [address] PCP: [name], DO  Subjective: Chief Complaint  Patient presents with  . Right Elbow - Pain    Elbow swelled again - woke up with it. Painful. Not as swollen as at last visit.    HPI: He is here with recurrent right elbow pain and swelling.  Aspiration in July followed by oral prednisone made his pain go away until just the other day.  No injury, he has had swelling and pain which is more intense than it was before.              ROS:   All other systems were reviewed and are negative.  Objective: Vital Signs: There were no vitals taken for this visit.  Physical Exam:  General:  Alert and oriented, in no acute distress. Pulm:  Breathing unlabored. Psy:  Normal mood, congruent affect. Skin: No erythema Right elbow: 1+ swelling of the olecranon bursa with mild tenderness.  No joint effusion.  There is some nodularity to the bursa.  Imaging: No results found.  Assessment & Plan: 1.  Right elbow olecranon bursitis, aseptic. -Discussed options and elected to aspirate and inject with cortisone.  He will follow-up as needed.     Procedures: Right elbow aspiration and injection: After sterile prep with Betadine, injected 3 cc 1% lidocaine without epinephrine, then aspirated 7 cc of blood-tinged synovial fluid, then injected 40 mg methylprednisolone.  Compressive wrap was applied.    PMFS History: Patient Active Problem List   Diagnosis Date Noted  . Sore throat 12/14/2019  . Acute kidney injury superimposed on chronic kidney disease ([place])   . Respiratory failure ([place])   . Acute encephalopathy   . Hypernatremia   . Dyspnea   . Acute respiratory failure with hypoxemia ([place])   . SDH (subdural hematoma) ([place]) 10/24/2019  . Chronic  pain of right knee 10/03/2019  . Cyclic vomiting syndrome 08/06/2019  . Neuropathy 12/25/2018  . Bilateral hip pain 08/24/2018  . AK (actinic keratosis) 08/24/2018  . Neoplasm of uncertain behavior 32/95/1884  . 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 Tendinopathy of right gluteus medius 07/13/2018  . Tendinopathy of left gluteus medius 07/13/2018  . Squamous cell carcinoma in situ (SCCIS) of skin 03/24/2018  . Skin lesion 03/17/2018  . Vitamin D deficiency 11/29/2017  . Stage 3 chronic kidney disease 10/28/2017  . Primary osteoarthritis of right hip 09/11/2017  . Essential hypertension 07/18/2017  . Hyperlipidemia associated with type 2 diabetes mellitus ([place]) 07/18/2017  . Type 2 diabetes mellitus with diabetic neuropathy, unspecified ([place]) 07/18/2017  . Heart murmur 07/18/2017  . Hypertriglyceridemia 07/18/2017  . History of stroke 07/18/2017  . DDD (degenerative disc disease), lumbar 06/15/2017  . Iron deficiency anemia 06/15/2017  . Stroke ([place]) 06/15/2017  . Encounter for hepatitis C screening test for low risk patient 06/30/2016  . Anemia 06/19/2016  . Microalbuminuria due to type 2 diabetes mellitus ([place]) 03/19/2016  . Hypertrophy of inferior nasal turbinate 12/20/2015  . Nasal congestion 12/15/2015  . Disease of nasal cavity and sinuses 12/10/2015  . Degenerative tear of acetabular labrum of left hip 11/04/2015  . Cellulitis and abscess of left leg 09/25/2015  . History of tobacco  abuse 05/15/2015  . Morbid (severe) obesity due to excess calories ([place]) 04/11/2015  . Disorder of both eustachian tubes 01/27/2015  . Maxillary sinusitis 01/13/2015  . Lipoprotein deficiency 01/31/2014  . Gastro-esophageal reflux disease without esophagitis 11/02/2013  . Rheumatic fever 11/02/2013  . Erectile dysfunction associated with type 2 diabetes mellitus ([place]) 07/31/2013  . Pain in joint, ankle and foot 02/06/2013  . Type 2 diabetes mellitus with hyperglycemia, without long-term current use  of insulin ([place]) 11/16/2012   Past Medical History:  Diagnosis Date  . Anemia   . Essential hypertension 07/18/2017  . History of rheumatic fever   . History of stroke 07/18/2017  . Hypertriglyceridemia 07/18/2017  . Type 2 diabetes mellitus with diabetic neuropathy, unspecified ([place]) 07/18/2017    Family History  Problem Relation Age of Onset  . Hyperlipidemia Mother   . Hyperlipidemia Father   . Alcoholism Brother   . Healthy Son   . Healthy Son   . Cancer Neg Hx     Past Surgical History:  Procedure Laterality Date  . CRANIOTOMY Left 10/25/2019   Procedure: CRANIOTOMY HEMATOMA EVACUATION SUBDURAL;  Surgeon: Vallarie Mare, MD;  Location: [facility];  Service: Neurosurgery;  Laterality: Left;  . FOOT CAPSULOTOMY Left 07/25/2008   Mid Foot #2 MPJ  . Hammertoe Repair Left 07/25/2008   #2 toe  . SPINAL FUSION    . TARSAL TUNNEL RELEASE Left 07/25/2008   Social History   Occupational History  . Not on file  Tobacco Use  . Smoking status: Former Smoker    Types: Cigarettes  . Smokeless tobacco: Never Used  . Tobacco comment: Quit in 1973  Vaping Use  . Vaping Use: Never used  Substance and Sexual Activity  . Alcohol use: Yes    Comment: occ beer  . Drug use: No  . Sexual activity: Never

## 2020-06-25 ENCOUNTER — Other Ambulatory Visit: Payer: Self-pay | Admitting: *Deleted

## 2020-06-25 DIAGNOSIS — M0579 Rheumatoid arthritis with rheumatoid factor of multiple sites without organ or systems involvement: Secondary | ICD-10-CM

## 2020-06-25 MED ORDER — LEFLUNOMIDE 10 MG PO TABS: 10.0000 mg | ORAL_TABLET | Freq: Every day | ORAL | 2 refills | Status: DC

## 2020-06-25 NOTE — Telephone Encounter (Signed)
Patient contacted the office requesting a refill on Arava.  Last Visit: 06/03/2020 Next Visit:  07/15/2020 Labs: 06/04/2020 RBC 3.89, Hgb 9.3, Hct 30.9, MVC 79.4, MCH 23.9, MCHC 30.1, RDW 18.4, CMP 05/16/2020 Sodium 134, CO2 19, Glucose 230 BUN 46, Creat. 1.52, GFR 44  Current Dose per office note 06/03/2020: Arava 10 mg 1 tablet by mouth daily [id number] arthritis involving multiple sites with positive rheumatoid factor  Okay to refill Arava?

## 2020-07-01 NOTE — Progress Notes (Signed)
Office Visit Note  Patient: Tommy Green             Date of Birth: 08/07/1946           MRN: [id number]             PCP: [name], DO Referring: [name]* Visit Date: 07/15/2020 Occupation: @GUAROCC @  Subjective:  Pain in both hands   History of Present Illness: Tommy Green is a 74 y.o. male with history of seropositive rheumatoid arthritis, osteoarthritis, and DDD.  He is taking Arava 10 mg 1 tablet by mouth daily and Enbrel 50 mg sq injections once weekly. He was started on Enbrel on 06/16/20.  Yesterday was his 5th injection. He has not had any recent injection site reactions or infections. He continues to have pain and stiffness in both hands, both wrist joints, and both elbow joints.  He had a recurrence of olecranon bursitis of the right elbow, and he was evaluated by Dr. Junius Roads on 06/24/20. He underwent an aspiration and cortisone injection, which improved his symptoms significantly.  He has noticed some improvement in his knee joint and hip joint pain.  He has been taking tylenol as needed for pain relief.     Activities of Daily Living:  Patient reports joint stiffness all day  Patient Denies nocturnal pain.  Difficulty dressing/grooming: Denies Difficulty climbing stairs: Reports Difficulty getting out of chair: Reports Difficulty using hands for taps, buttons, cutlery, and/or writing: Reports  Review of Systems  Constitutional: Negative for fatigue.  HENT: Positive for mouth dryness and sore tongue. Negative for mouth sores and nose dryness.   Eyes: Negative for pain, itching and dryness.  Respiratory: Negative for shortness of breath, wheezing and difficulty breathing.   Cardiovascular: Negative for chest pain and palpitations.  Gastrointestinal: Negative for blood in stool, constipation and diarrhea.  Endocrine: Negative for increased urination.  Genitourinary: Negative for difficulty urinating and painful urination.    Musculoskeletal: Positive for arthralgias, joint pain, joint swelling and morning stiffness. Negative for myalgias, muscle tenderness and myalgias.  Skin: Negative for color change, rash and redness.  Allergic/Immunologic: Negative for susceptible to infections.  Neurological: Positive for headaches. Negative for dizziness, numbness, memory loss and weakness.  Hematological: Negative for bruising/bleeding tendency.  Psychiatric/Behavioral: Negative for confusion and sleep disturbance.    PMFS History:  Patient Active Problem List   Diagnosis Date Noted  . Sore throat 12/14/2019  . Acute kidney injury superimposed on chronic kidney disease ([place])   . Respiratory failure ()   . Acute encephalopathy   . Hypernatremia   . Dyspnea   . Acute respiratory failure with hypoxemia ([place])   . SDH (subdural hematoma) ([place]) 10/24/2019  . Chronic pain of right knee 10/03/2019  . Cyclic vomiting syndrome 08/06/2019  . Neuropathy 12/25/2018  . Bilateral hip pain 08/24/2018  . AK (actinic keratosis) 08/24/2018  . Neoplasm of uncertain behavior 37/62/8315  . 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 Tendinopathy of right gluteus medius 07/13/2018  . Tendinopathy of left gluteus medius 07/13/2018  . Squamous cell carcinoma in situ (SCCIS) of skin 03/24/2018  . Skin lesion 03/17/2018  . Vitamin D deficiency 11/29/2017  . Stage 3 chronic kidney disease 10/28/2017  . Primary osteoarthritis of right hip 09/11/2017  . Essential hypertension 07/18/2017  . Hyperlipidemia associated with type 2 diabetes mellitus ([place]) 07/18/2017  . Type 2 diabetes mellitus with diabetic neuropathy, unspecified ([place]) 07/18/2017  . Heart murmur 07/18/2017  . Hypertriglyceridemia 07/18/2017  .  History of stroke 07/18/2017  . DDD (degenerative disc disease), lumbar 06/15/2017  . Iron deficiency anemia 06/15/2017  . Stroke ([place]) 06/15/2017  . Encounter for hepatitis C screening test for low risk patient 06/30/2016  . Anemia  06/19/2016  . Microalbuminuria due to type 2 diabetes mellitus ([place]) 03/19/2016  . Hypertrophy of inferior nasal turbinate 12/20/2015  . Nasal congestion 12/15/2015  . Disease of nasal cavity and sinuses 12/10/2015  . Degenerative tear of acetabular labrum of left hip 11/04/2015  . Cellulitis and abscess of left leg 09/25/2015  . History of tobacco abuse 05/15/2015  . Morbid (severe) obesity due to excess calories ([place]) 04/11/2015  . Disorder of both eustachian tubes 01/27/2015  . Maxillary sinusitis 01/13/2015  . Lipoprotein deficiency 01/31/2014  . Gastro-esophageal reflux disease without esophagitis 11/02/2013  . Rheumatic fever 11/02/2013  . Erectile dysfunction associated with type 2 diabetes mellitus ([place]) 07/31/2013  . Pain in joint, ankle and foot 02/06/2013  . Type 2 diabetes mellitus with hyperglycemia, without long-term current use of insulin ([place]) 11/16/2012    Past Medical History:  Diagnosis Date  . Anemia   . Essential hypertension 07/18/2017  . History of rheumatic fever   . History of stroke 07/18/2017  . Hypertriglyceridemia 07/18/2017  . Type 2 diabetes mellitus with diabetic neuropathy, unspecified ([place]) 07/18/2017    Family History  Problem Relation Age of Onset  . Hyperlipidemia Mother   . Hyperlipidemia Father   . Alcoholism Brother   . Healthy Son   . Healthy Son   . Cancer Neg Hx    Past Surgical History:  Procedure Laterality Date  . CRANIOTOMY Left 10/25/2019   Procedure: CRANIOTOMY HEMATOMA EVACUATION SUBDURAL;  Surgeon: Vallarie Mare, MD;  Location: [facility];  Service: Neurosurgery;  Laterality: Left;  . FOOT CAPSULOTOMY Left 07/25/2008   Mid Foot #2 MPJ  . Hammertoe Repair Left 07/25/2008   #2 toe  . SPINAL FUSION    . TARSAL TUNNEL RELEASE Left 07/25/2008   Social History   Social History Narrative  . Not on file   Immunization History  Administered Date(s) Administered  . Moderna SARS-COVID-2 Vaccination 12/11/2019, 01/10/2020  . Td  12/25/2018     Objective: Vital Signs: BP 105/70 (BP Location: Left Arm, Patient Position: Sitting, Cuff Size: Normal)   Pulse 73   Resp 18   Ht 5\' 10"  (1.778 m)   Wt 251 lb 9.6 oz (114.1 kg)   BMI 36.10 kg/m    Physical Exam Vitals and nursing note reviewed.  Constitutional:      Appearance: He is well-developed.  HENT:     Head: Normocephalic and atraumatic.  Eyes:     Conjunctiva/sclera: Conjunctivae normal.     Pupils: Pupils are equal, round, and reactive to light.  Pulmonary:     Effort: Pulmonary effort is normal.  Abdominal:     Palpations: Abdomen is soft.  Musculoskeletal:     Cervical back: Normal range of motion and neck supple.  Skin:    General: Skin is warm and dry.     Capillary Refill: Capillary refill takes less than 2 seconds.     Comments: Fingernail pitting noted.   Neurological:     Mental Status: He is alert and oriented to person, place, and time.  Psychiatric:        Behavior: Behavior normal.      Musculoskeletal Exam: C-spine limited ROM with lateral rotation.  Postural thoracic kyphosis.  No midline spinal tenderness or SI joint  tenderness.  Shoulder joints good ROM with stiffness bilaterally.  Bilateral elbow joint contractures. Limited ROM of both wrist joints with tenderness but no synovitis.  PIP and DIP thickening consistent with osteoarthritis of both hands. Incomplete fist formation bilaterally.  Right knee warmth, crepitus, and limited extension noted.  Left knee has good ROM with crepitus but no warmth or effusion. No tenderness or inflammation of ankle joints.   CDAI Exam: CDAI Score: 21.2  Patient Global: 7 mm; Provider Global: 5 mm Swollen: 5 ; Tender: 15  Joint Exam 07/15/2020      Right  Left  Elbow   Tender   Tender  Wrist   Tender   Tender  MCP 2   Tender   Tender  MCP 3  Swollen Tender  Swollen Tender  MCP 4   Tender   Tender  MCP 5   Tender   Tender  PIP 2     Swollen Tender  PIP 3     Swollen Tender  Knee  Swollen  Tender        Investigation: No additional findings.  Imaging: No results found.  Recent Labs: Lab Results  Component Value Date   WBC 10.3 06/04/2020   HGB 9.3 (L) 06/04/2020   PLT 224 06/04/2020   NA 134 (L) 05/16/2020   K 4.5 05/16/2020   CL 103 05/16/2020   CO2 19 (L) 05/16/2020   GLUCOSE 230 (H) 05/16/2020   BUN 46 (H) 05/16/2020   CREATININE 1.52 (H) 05/16/2020   BILITOT 0.3 05/16/2020   ALKPHOS 41 05/16/2020   AST 18 05/16/2020   ALT 13 05/16/2020   PROT 7.0 05/16/2020   ALBUMIN 3.5 05/16/2020   CALCIUM 8.9 05/16/2020   GFRAA 51 (L) 05/16/2020   QFTBGOLDPLUS NEGATIVE 04/17/2020    Speciality Comments: No specialty comments available.  Procedures:  No procedures performed Allergies: Influenza vaccines, Atorvastatin, Canagliflozin, and Statins   Assessment / Plan:     Visit Diagnoses: Rheumatoid arthritis involving multiple sites with positive rheumatoid factor (HCC) - Positive RF, positive anti-CCP, elevated sedimentation rate, history of inflammatory arthritis: He has ongoing joint tenderness, stiffness, and intermittent inflammation in multiple joints.  He has tenderness palpation over bilateral elbows, bilateral wrist joints, 2nd-5th MCP joints and several PIP joints as described above. He has ongoing discomfort, warmth, and limited extension of his right knee joint on exam.  He is currently taking Arava 10 mg 1 tablet by mouth daily and Enbrel 50 mg subcutaneous injections once weekly.  He was started on Enbrel on 06/16/2020 and injected his fifth dose yesterday.  He has noticed some improvement in his bilateral hip and knee joint pain since adding on Enbrel as combination therapy.  He has not noticed any improvement in his hand or wrist pain yet.  We discussed the importance of giving combination therapy more time.  He declined a prednisone taper and plans on taking Tylenol as needed for pain relief.  He will follow-up in the office in 6 weeks to reassess his  response.  High risk medication use - Arava 10 mg 1 tablet by mouth daily and Enbrel mini 50 mg sq injections once weekly. He was started on Enbrel on 06/16/20-he had his 5th injection yesterday.  CBC and CMP were drawn on 05/16/2020.  He is due to update lab work today and every 3 months to monitor for drug toxicity.  TB gold was negative on 04/17/2020 and will continue to be monitored yearly.- Plan: CBC with Differential/Platelet, COMPLETE  METABOLIC PANEL WITH GFR He has received both COVID-19 vaccinations. He has not had any recent infections.  He was advised to hold [place] and Enbrel if he develops signs or symptoms of an infection and to resume once the infection has completely cleared.  Olecranon bursitis, right elbow - aspiration performed on 05/15/2020 with synovial analysis performed  No crystals were found and white blood cell count was 2554 neutrophils were 62.  Culture negative at that time.  He had a recurrence and was evaluated by Dr. Junius Roads on 06/24/2020.  He elected for an aspiration with a cortisone injection at that visit which has improved his symptoms significantly.  No inflammation was noted on exam today.  We discussed the importance of avoiding pressure and compression.  Contracture of right elbow: Chronic and unchanged.  He has tenderness but no inflammation on exam.  Primary osteoarthritis of both hips - Moderate to severe osteoarthritis of both hips.   Primary osteoarthritis of left knee - Mild osteoarthritis.  He has good range of motion of the left knee joint with crepitus.  No warmth or effusion was noted on exam.  Chronic SI joint pain - X-rays were unremarkable.  He has no SI joint tenderness to palpation on exam.  DDD (degenerative disc disease), lumbar - L4-L5 fusion by Dr.Cohen.  Facet joint arthropathy.  He is not experiencing any increased lower back pain at this time.  He has no symptoms of radiculopathy.  Other medical conditions are listed as follows:   Essential  hypertension  Hyperlipidemia associated with type 2 diabetes mellitus ([place])  History of stroke - At age 33 with left-sided weakness  Granuloma annulare  SDH (subdural hematoma) (HCC)  Squamous cell carcinoma in situ (SCCIS) of skin  Heart murmur  Type 2 diabetes mellitus with diabetic neuropathy, with long-term current use of insulin ([place])  Orders: Orders Placed This Encounter  Procedures  . CBC with Differential/Platelet  . COMPLETE METABOLIC PANEL WITH GFR   No orders of the defined types were placed in this encounter.     Follow-Up Instructions: Return in 6 weeks (on 08/26/2020) for Rheumatoid arthritis, Osteoarthritis, DDD.   [name], PA-C  Note - This record has been created using Dragon software.  Chart creation errors have been sought, but may not always  have been located. Such creation errors do not reflect on  the standard of medical care.

## 2020-07-09 ENCOUNTER — Ambulatory Visit: Payer: Medicare HMO | Admitting: Cardiology

## 2020-07-10 DIAGNOSIS — E782 Mixed hyperlipidemia: Secondary | ICD-10-CM | POA: Diagnosis not present

## 2020-07-10 DIAGNOSIS — Z6837 Body mass index (BMI) 37.0-37.9, adult: Secondary | ICD-10-CM | POA: Diagnosis not present

## 2020-07-10 DIAGNOSIS — E876 Hypokalemia: Secondary | ICD-10-CM | POA: Diagnosis not present

## 2020-07-10 DIAGNOSIS — E1165 Type 2 diabetes mellitus with hyperglycemia: Secondary | ICD-10-CM | POA: Diagnosis not present

## 2020-07-15 ENCOUNTER — Encounter: Payer: Self-pay | Admitting: Physician Assistant

## 2020-07-15 ENCOUNTER — Ambulatory Visit: Payer: Medicare HMO | Admitting: Physician Assistant

## 2020-07-15 ENCOUNTER — Other Ambulatory Visit: Payer: Self-pay

## 2020-07-15 VITALS — BP 105/70 | HR 73 | Resp 18 | Ht 70.0 in | Wt 251.6 lb

## 2020-07-15 DIAGNOSIS — E785 Hyperlipidemia, unspecified: Secondary | ICD-10-CM

## 2020-07-15 DIAGNOSIS — M7021 Olecranon bursitis, right elbow: Secondary | ICD-10-CM | POA: Diagnosis not present

## 2020-07-15 DIAGNOSIS — Z794 Long term (current) use of insulin: Secondary | ICD-10-CM

## 2020-07-15 DIAGNOSIS — Z79899 Other long term (current) drug therapy: Secondary | ICD-10-CM

## 2020-07-15 DIAGNOSIS — G8929 Other chronic pain: Secondary | ICD-10-CM

## 2020-07-15 DIAGNOSIS — E1169 Type 2 diabetes mellitus with other specified complication: Secondary | ICD-10-CM | POA: Diagnosis not present

## 2020-07-15 DIAGNOSIS — M533 Sacrococcygeal disorders, not elsewhere classified: Secondary | ICD-10-CM

## 2020-07-15 DIAGNOSIS — M5136 Other intervertebral disc degeneration, lumbar region: Secondary | ICD-10-CM | POA: Diagnosis not present

## 2020-07-15 DIAGNOSIS — L920 Granuloma annulare: Secondary | ICD-10-CM

## 2020-07-15 DIAGNOSIS — M0579 Rheumatoid arthritis with rheumatoid factor of multiple sites without organ or systems involvement: Secondary | ICD-10-CM | POA: Diagnosis not present

## 2020-07-15 DIAGNOSIS — E1140 Type 2 diabetes mellitus with diabetic neuropathy, unspecified: Secondary | ICD-10-CM

## 2020-07-15 DIAGNOSIS — M24521 Contracture, right elbow: Secondary | ICD-10-CM | POA: Diagnosis not present

## 2020-07-15 DIAGNOSIS — S065X9A Traumatic subdural hemorrhage with loss of consciousness of unspecified duration, initial encounter: Secondary | ICD-10-CM

## 2020-07-15 DIAGNOSIS — R011 Cardiac murmur, unspecified: Secondary | ICD-10-CM

## 2020-07-15 DIAGNOSIS — M1712 Unilateral primary osteoarthritis, left knee: Secondary | ICD-10-CM | POA: Diagnosis not present

## 2020-07-15 DIAGNOSIS — M160 Bilateral primary osteoarthritis of hip: Secondary | ICD-10-CM

## 2020-07-15 DIAGNOSIS — I10 Essential (primary) hypertension: Secondary | ICD-10-CM | POA: Diagnosis not present

## 2020-07-15 DIAGNOSIS — D049 Carcinoma in situ of skin, unspecified: Secondary | ICD-10-CM

## 2020-07-15 DIAGNOSIS — Z8673 Personal history of transient ischemic attack (TIA), and cerebral infarction without residual deficits: Secondary | ICD-10-CM

## 2020-07-15 DIAGNOSIS — S065XAA Traumatic subdural hemorrhage with loss of consciousness status unknown, initial encounter: Secondary | ICD-10-CM

## 2020-07-15 DIAGNOSIS — R69 Illness, unspecified: Secondary | ICD-10-CM | POA: Diagnosis not present

## 2020-07-15 NOTE — Patient Instructions (Signed)
Standing Labs We placed an order today for your standing lab work.   Please have your standing labs drawn in December and every 3 months   If possible, please have your labs drawn 2 weeks prior to your appointment so that the provider can discuss your results at your appointment.  We have open lab daily Monday through Thursday from 8:30-12:30 PM and 1:30-4:30 PM and Friday from 8:30-12:30 PM and 1:30-4:00 PM at the office of Dr. Shaili Deveshwar, [facility].   Please be advised, patients with office appointments requiring lab work will take precedents over walk-in lab work.  If possible, please come for your lab work on Monday and Friday afternoons, as you may experience shorter wait times. The office is located at [address], [address], [place], [place] [address] No appointment is necessary.   Labs are drawn by [facility]. Please bring your co-pay at the time of your lab draw.  You may receive a bill from [facility] for your lab work.  If you wish to have your labs drawn at another location, please call the office 24 hours in advance to send orders.  If you have any questions regarding directions or hours of operation,  please call [phone].   As a reminder, please drink plenty of water prior to coming for your lab work. Thanks!   COVID-19 vaccine recommendations:   COVID-19 vaccine is recommended for everyone (unless you are allergic to a vaccine component), even if you are on a medication that suppresses your immune system.   If you are on Methotrexate, Cellcept (mycophenolate), Rinvoq, Xeljanz, and Olumiant- hold the medication for 1 week after each vaccine. Hold Methotrexate for 2 weeks after the single dose COVID-19 vaccine.   If you are on Orencia subcutaneous injection - hold medication one week prior to and one week after the first COVID-19 vaccine dose (only).   If you are on Orencia IV infusions- time vaccination administration so that the first COVID-19  vaccination will occur four weeks after the infusion and postpone the subsequent infusion by one week.   If you are on Cyclophosphamide or Rituxan infusions please contact your doctor prior to receiving the COVID-19 vaccine.   Do not take Tylenol or any anti-inflammatory medications (NSAIDs) 24 hours prior to the COVID-19 vaccination.   There is no direct evidence about the efficacy of the COVID-19 vaccine in individuals who are on medications that suppress the immune system.   Even if you are fully vaccinated, and you are on any medications that suppress your immune system, please continue to wear a mask, maintain at least six feet social distance and practice hand hygiene.   If you develop a COVID-19 infection, please contact your PCP or our office to determine if you need antibody infusion.  The booster vaccine is now available for immunocompromised patients. It is advised that if you had Pfizer vaccine you should get Pfizer booster.  If you had a Moderna vaccine then you should get a Moderna booster. Johnson and Johnson does not have a booster vaccine at this time.  Please see the following web sites for updated information.   [email or web address]://[email or web address]/[email or web address]-Vaccination-Patient-Resources.[email or web address]  [email or web address]/About-[email or web address]/ID/1159    

## 2020-07-16 LAB — CBC WITH DIFFERENTIAL/PLATELET
Absolute Monocytes: 680 cells/uL (ref 200–950)
Basophils Absolute: 86 cells/uL (ref 0–200)
Basophils Relative: 0.8 %
Eosinophils Absolute: 313 cells/uL (ref 15–500)
Eosinophils Relative: 2.9 %
HCT: 29.5 % — ABNORMAL LOW (ref 38.5–50.0)
Hemoglobin: 8.8 g/dL — ABNORMAL LOW (ref 13.2–17.1)
Lymphs Abs: 2635 cells/uL (ref 850–3900)
MCH: 23.2 pg — ABNORMAL LOW (ref 27.0–33.0)
MCHC: 29.8 g/dL — ABNORMAL LOW (ref 32.0–36.0)
MCV: 77.8 fL — ABNORMAL LOW (ref 80.0–100.0)
MPV: 10.4 fL (ref 7.5–12.5)
Monocytes Relative: 6.3 %
Neutro Abs: 7085 cells/uL (ref 1500–7800)
Neutrophils Relative %: 65.6 %
Platelets: 336 10*3/uL (ref 140–400)
RBC: 3.79 10*6/uL — ABNORMAL LOW (ref 4.20–5.80)
RDW: 17.2 % — ABNORMAL HIGH (ref 11.0–15.0)
Total Lymphocyte: 24.4 %
WBC: 10.8 10*3/uL (ref 3.8–10.8)

## 2020-07-16 LAB — COMPLETE METABOLIC PANEL WITH GFR
AG Ratio: 1.5 (calc) (ref 1.0–2.5)
ALT: 9 U/L (ref 9–46)
AST: 14 U/L (ref 10–35)
Albumin: 3.8 g/dL (ref 3.6–5.1)
Alkaline phosphatase (APISO): 37 U/L (ref 35–144)
BUN/Creatinine Ratio: 29 (calc) — ABNORMAL HIGH (ref 6–22)
BUN: 46 mg/dL — ABNORMAL HIGH (ref 7–25)
CO2: 22 mmol/L (ref 20–32)
Calcium: 9 mg/dL (ref 8.6–10.3)
Chloride: 106 mmol/L (ref 98–110)
Creat: 1.59 mg/dL — ABNORMAL HIGH (ref 0.70–1.18)
GFR, Est African American: 48 mL/min/{1.73_m2} — ABNORMAL LOW (ref >=60)
GFR, Est Non African American: 42 mL/min/{1.73_m2} — ABNORMAL LOW (ref >=60)
Globulin: 2.5 g/dL (calc) (ref 1.9–3.7)
Glucose, Bld: 130 mg/dL — ABNORMAL HIGH (ref 65–99)
Potassium: 4.8 mmol/L (ref 3.5–5.3)
Sodium: 139 mmol/L (ref 135–146)
Total Bilirubin: 0.2 mg/dL (ref 0.2–1.2)
Total Protein: 6.3 g/dL (ref 6.1–8.1)

## 2020-07-17 ENCOUNTER — Telehealth: Payer: Self-pay | Admitting: *Deleted

## 2020-07-17 DIAGNOSIS — R944 Abnormal results of kidney function studies: Secondary | ICD-10-CM

## 2020-07-17 DIAGNOSIS — R7989 Other specified abnormal findings of blood chemistry: Secondary | ICD-10-CM

## 2020-07-17 NOTE — Progress Notes (Signed)
RBC count, Hgb, and hct are low.  Hgb has dropped from 9.3 to 8.8.  Please notify the patient. He has a history of chronic anemia likely secondary to chronic kidney disease.  He will require further workup to determine why his hgb continues to trend down. Creatinine remains elevated and GFR is low-42. Please advise the patient to avoid taking NSAIDs.  Please clarify if the patient has a nephrologist.

## 2020-07-17 NOTE — Telephone Encounter (Signed)
-----   Message from [name], PA-C sent at 07/17/2020  3:24 PM EDT ----- Please place a referral to nephrology for evaluation of elevated creatinine and low GFR.

## 2020-07-17 NOTE — Progress Notes (Signed)
Please place a referral to nephrology for evaluation of elevated creatinine and low GFR.

## 2020-07-28 ENCOUNTER — Encounter: Payer: Self-pay | Admitting: Gastroenterology

## 2020-07-28 ENCOUNTER — Other Ambulatory Visit: Payer: Self-pay

## 2020-07-28 ENCOUNTER — Ambulatory Visit (INDEPENDENT_AMBULATORY_CARE_PROVIDER_SITE_OTHER): Payer: Medicare HMO | Admitting: Gastroenterology

## 2020-07-28 VITALS — BP 120/70 | HR 75 | Ht 71.0 in | Wt 249.5 lb

## 2020-07-28 DIAGNOSIS — D509 Iron deficiency anemia, unspecified: Secondary | ICD-10-CM | POA: Diagnosis not present

## 2020-07-28 DIAGNOSIS — K219 Gastro-esophageal reflux disease without esophagitis: Secondary | ICD-10-CM | POA: Diagnosis not present

## 2020-07-28 MED ORDER — NA SULFATE-K SULFATE-MG SULF 17.5-3.13-1.6 GM/177ML PO SOLN
ORAL | 0 refills | Status: DC
Start: 2020-07-28 — End: 2020-09-02

## 2020-07-28 NOTE — Patient Instructions (Addendum)
If you are age 76 or older, your body mass index should be between 23-30. Your Body mass index is 34.8 kg/m. If this is out of the aforementioned range listed, please consider follow up with your Primary Care Provider.  If you are age 52 or younger, your body mass index should be between 19-25. Your Body mass index is 34.8 kg/m. If this is out of the aformentioned range listed, please consider follow up with your Primary Care Provider.    You have been scheduled for an endoscopy and colonoscopy. Please follow the written instructions given to you at your visit today. Please pick up your prep supplies at the pharmacy within the next 1-3 days. If you use inhalers (even only as needed), please bring them with you on the day of your procedure.  We have sent the following medications to your pharmacy for you to pick up at your convenience: Suprep  It was a pleasure to see you today!  Vito Cirigliano, D.O.

## 2020-07-28 NOTE — Progress Notes (Signed)
Chief Complaint: Iron Deficiency anemia, fatigue   Referring Provider:     [name], DO    HPI:     Tommy Green is a 74 y.o. male with a history of diabetes, obesity (BMI 34.8, OSA, CKD 3, hypertriglyceridemia, HTN, RA (recently started Enbrel), hx of Rheumatic fever, referred to the Gastroenterology Clinic for evaluation of IDA.   He states he was told he was anemic back in his 20's of unknown etiology, but eventually resolved. No melena or hematochezia. Occasional loose stools, but he attributes this to medication changes.   Was previously seen by Dr. Rolm Bookbinder [facility] GI clinic in 10/2017 for anemia with hemoglobin 11-12 and normal iron indices from 05/2017.  Also reported heartburn at that time.  Was evaluated with EGD/colonoscopy. 1 polyp per patient, with recommendation to repeat 5 years. He is unsure if he completed VCE there.   -05/2017:H/H 12/36.6.  ferritin 174, iron 63, sat 18%. Normal B12/folate -04/19/2018: H/H 11.9/36.5 -10/24/2019: H/H 9.7/31.3, MCV 85 -04/14/2020: H/H 9.5/32, MCV/RDW 81/16.5.  Normal liver enzymes -05/06/2020: H/H 8.6/28.4, MCV/RDW 78/17 -05/16/2020: H/H 9.4/29.8, MCV/RDW 77/18.6 -06/04/2020: H/H 9.3/31, MCV/RDW 79/18.4.  Ferritin 50.7, iron 28, sat 7.4% -07/07/2020: H/H 8.8/29.5, MCV/RDW 77.8/17.  Normal liver enzymes  Separately, history of GERD which is well controlled omeprazole 40 mg/separate   Past Medical History:  Diagnosis Date  . Anemia   . Essential hypertension 07/18/2017  . History of colonic polyps   . History of rheumatic fever   . History of stroke 07/18/2017  . Hypertriglyceridemia 07/18/2017  . Type 2 diabetes mellitus with diabetic neuropathy, unspecified ([place]) 07/18/2017     Past Surgical History:  Procedure Laterality Date  . COLONOSCOPY     around 2018 [facility]  . CRANIOTOMY Left 10/25/2019   Procedure: CRANIOTOMY HEMATOMA EVACUATION SUBDURAL;  Surgeon: Vallarie Mare, MD;  Location: [facility];   Service: Neurosurgery;  Laterality: Left;  . ESOPHAGOGASTRODUODENOSCOPY     around 2018 with [facility]  . FOOT CAPSULOTOMY Left 07/25/2008   Mid Foot #2 MPJ  . Hammertoe Repair Left 07/25/2008   #2 toe  . SPINAL FUSION    . TARSAL TUNNEL RELEASE Left 07/25/2008   Family History  Problem Relation Age of Onset  . Hyperlipidemia Mother   . Colon polyps Mother   . Hyperlipidemia Father   . Alcoholism Brother   . Healthy Son   . Healthy Son   . Cancer Neg Hx    Social History   Tobacco Use  . Smoking status: Former Smoker    Types: Cigarettes  . Smokeless tobacco: Never Used  . Tobacco comment: Quit in 1973  Vaping Use  . Vaping Use: Never used  Substance Use Topics  . Alcohol use: Yes    Comment: occ beer  . Drug use: No   Current Outpatient Medications  Medication Sig Dispense Refill  . b complex vitamins capsule Take 1 capsule by mouth daily.    . carvedilol (COREG) 12.5 MG tablet TAKE 1 TABLET TWICE DAILY  WITH MEALS. 60 tablet 1  . cetirizine (ZYRTEC) 10 MG tablet Take 1 tablet (10 mg total) by mouth daily. 30 tablet 3  . Etanercept (ENBREL MINI) 50 MG/ML SOCT Inject 50 mg into the skin once a week. 2 mL 0  . fenofibrate micronized (LOFIBRA) 200 MG capsule TAKE 1 CAPSULE DAILY BEFOREBREAKFAST 90 capsule 3  . fluticasone (FLONASE) 50  MCG/ACT nasal spray Place 2 sprays into both nostrils daily. 16 g 6  . gabapentin (NEURONTIN) 600 MG tablet TAKE 1 TABLET 3 TIMES A DAY 270 tablet 0  . hydrochlorothiazide (HYDRODIURIL) 25 MG tablet TAKE 1 TABLET DAILY 90 tablet 3  . insulin aspart (NOVOLOG) 100 UNIT/ML injection Inject 0-20 Units into the skin 4 (four) times daily -  before meals and at bedtime.    . insulin degludec (TRESIBA) 100 UNIT/ML SOPN FlexTouch Pen Inject 0.6 mLs (60 Units total) into the skin at bedtime. 5 pen 2  . leflunomide (ARAVA) 10 MG tablet Take 1 tablet (10 mg total) by mouth daily. 30 tablet 2  . lisinopril (ZESTRIL) 40 MG tablet TAKE 1 TABLET DAILY  (Patient taking differently: Take 40 mg by mouth daily. ) 90 tablet 3  . lovastatin (MEVACOR) 10 MG tablet Take 1 tablet (10 mg total) by mouth at bedtime. 30 tablet 3  . Magnesium 200 MG TABS Take by mouth daily.    . metFORMIN (GLUCOPHAGE) 500 MG tablet Take 500 mg by mouth 2 (two) times daily with a meal.    . Multiple Vitamins-Minerals (MULTIVITAMIN WITH MINERALS) tablet Take 1 tablet by mouth daily.    Marland Kitchen omeprazole (PRILOSEC) 20 MG capsule Take 20 mg by mouth at bedtime.    Glory Rosebush VERIO test strip 4 (four) times daily.    . Turmeric 500 MG CAPS Take by mouth daily.    . colchicine 0.6 MG tablet Take 1 tablet (0.6 mg total) by mouth daily as needed. (Patient not taking: Reported on 07/28/2020) 30 tablet 1   No current facility-administered medications for this visit.   Allergies  Allergen Reactions  . Influenza Vaccines Other (See Comments)  . Atorvastatin Other (See Comments)    Myalgias GI upset Unspecified    . Canagliflozin     Other reaction(s): Other (See Comments) Pt reports that the use of Invokana caused acute kidney failure  Unspecified    . Statins     Other reaction(s): Myalgias (intolerance)     Review of Systems: All systems reviewed and negative except where noted in HPI.     Physical Exam:    Wt Readings from Last 3 Encounters:  07/28/20 249 lb 8 oz (113.2 kg)  07/15/20 251 lb 9.6 oz (114.1 kg)  06/04/20 254 lb (115.2 kg)    BP 120/70   Pulse 75   Ht 5\' 11"  (1.803 m)   Wt 249 lb 8 oz (113.2 kg)   BMI 34.80 kg/m  Constitutional:  Pleasant, in no acute distress. Psychiatric: Normal mood and affect. Behavior is normal. EENT: Pupils normal.  Conjunctivae are normal. No scleral icterus. Neck supple. No cervical LAD. Cardiovascular: 5/6 SEM. Normal rate, regular rhythm. No edema Pulmonary/chest: Effort normal and breath sounds normal. No wheezing, rales or rhonchi. Abdominal: Soft, nondistended, nontender. Bowel sounds active throughout. There  are no masses palpable. No hepatomegaly. Neurological: Alert and oriented to person place and time. Skin: Skin is warm and dry. No rashes noted.   ASSESSMENT AND PLAN;   1) Iron deficiency anemia: Unclear etiology.  Interestingly, had very mild anemia in 2019 with otherwise normal iron indices.  Since that time, hemoglobin has worsened with declining MCV and now clear iron deficiency.  No overt GI blood loss.  Discussed GI etiologies for IDA at length today, and given worsening serologies, plan for the following:  -EGD and colonoscopy for diagnostic and potentially therapeutic intent -Requested records from previous GI -If EGD/colonoscopy  unrevealing, plan for VCE -Start PO iron after completing EGD/Colo with repeat labs in 3 months  2) GERD -Well controlled with omeprazole 40 mg/day -Can evaluate for reflux changes at time of EGD as above  The indications, risks, and benefits of EGD and colonoscopy were explained to the patient in detail. Risks include but are not limited to bleeding, perforation, adverse reaction to medications, and cardiopulmonary compromise. Sequelae include but are not limited to the possibility of surgery, hositalization, and mortality. The patient verbalized understanding and wished to proceed. All questions answered, referred to scheduler and bowel prep ordered. Further recommendations pending results of the exam.    Tommy Pea Mylin Hirano, DO, FACG  07/28/2020, 1:32 PM   Nani Ravens, [place]*

## 2020-07-29 ENCOUNTER — Telehealth: Payer: Self-pay | Admitting: Gastroenterology

## 2020-07-29 NOTE — Telephone Encounter (Signed)
Patient's wife  called stating that copay for prep for procedure is over $100. They would like something more affordable. Please call patient.

## 2020-07-30 NOTE — Telephone Encounter (Signed)
I have called and left a message for patient to return my call.  

## 2020-07-30 NOTE — Telephone Encounter (Signed)
Spoke with patient and will switch to miralax solution and patient will pick up new instructions Friday afternoon at the [facility] office

## 2020-08-17 DIAGNOSIS — R69 Illness, unspecified: Secondary | ICD-10-CM | POA: Diagnosis not present

## 2020-08-19 ENCOUNTER — Encounter: Payer: Self-pay | Admitting: Gastroenterology

## 2020-08-22 DIAGNOSIS — I129 Hypertensive chronic kidney disease with stage 1 through stage 4 chronic kidney disease, or unspecified chronic kidney disease: Secondary | ICD-10-CM | POA: Diagnosis not present

## 2020-08-22 DIAGNOSIS — N1832 Chronic kidney disease, stage 3b: Secondary | ICD-10-CM | POA: Diagnosis not present

## 2020-08-22 DIAGNOSIS — D509 Iron deficiency anemia, unspecified: Secondary | ICD-10-CM | POA: Diagnosis not present

## 2020-08-22 DIAGNOSIS — M899 Disorder of bone, unspecified: Secondary | ICD-10-CM | POA: Diagnosis not present

## 2020-08-25 ENCOUNTER — Other Ambulatory Visit: Payer: Self-pay | Admitting: Neurosurgery

## 2020-08-25 DIAGNOSIS — S065XAA Traumatic subdural hemorrhage with loss of consciousness status unknown, initial encounter: Secondary | ICD-10-CM

## 2020-08-25 DIAGNOSIS — S065X9A Traumatic subdural hemorrhage with loss of consciousness of unspecified duration, initial encounter: Secondary | ICD-10-CM

## 2020-08-25 NOTE — Progress Notes (Signed)
Office Visit Note  Patient: Tommy Green             Date of Birth: 1946/06/04           MRN: [id number]             PCP: [name], DO Referring: [name]* Visit Date: 09/08/2020 Occupation: @GUAROCC @  Subjective:  Other (bilateral hip pain )   History of Present Illness: Tommy Green is a 74 y.o. male with history of rheumatoid arthritis, osteoarthritis, degenerative disc disease.  He states he continues to have pain and discomfort in his bilateral hips and bilateral knee joints.  He also has some discomfort in his hands and feet.  The lower back is not as bothersome currently.  He was seen by hematologist for anemia.  He states he will be getting iron infusions.  He is also followed by nephrologist per patient.  Activities of Daily Living:  Patient reports morning stiffness for all day.    Patient Reports nocturnal pain.  Difficulty dressing/grooming: Denies Difficulty climbing stairs: Reports Difficulty getting out of chair: Reports Difficulty using hands for taps, buttons, cutlery, and/or writing: Reports  Review of Systems  Constitutional: Negative for fatigue.  HENT: Positive for mouth dryness. Negative for mouth sores and nose dryness.   Eyes: Negative for pain, itching and dryness.  Respiratory: Negative for shortness of breath and difficulty breathing.   Cardiovascular: Negative for chest pain and palpitations.  Gastrointestinal: Negative for blood in stool, constipation and diarrhea.  Endocrine: Negative for increased urination.  Genitourinary: Negative for difficulty urinating.  Musculoskeletal: Positive for arthralgias, joint pain, joint swelling, myalgias, morning stiffness, muscle tenderness and myalgias.  Skin: Negative for color change, rash and redness.  Allergic/Immunologic: Negative for susceptible to infections.  Neurological: Negative for dizziness, numbness, headaches, memory loss and weakness.  Hematological:  Negative for bruising/bleeding tendency.  Psychiatric/Behavioral: Negative for confusion.    PMFS History:  Patient Active Problem List   Diagnosis Date Noted  . Sore throat 12/14/2019  . Acute kidney injury superimposed on chronic kidney disease ([place])   . Respiratory failure ([place])   . Acute encephalopathy   . Hypernatremia   . Dyspnea   . Acute respiratory failure with hypoxemia ([place])   . SDH (subdural hematoma) ([place]) 10/24/2019  . Chronic pain of right knee 10/03/2019  . Cyclic vomiting syndrome 08/06/2019  . Neuropathy 12/25/2018  . Bilateral hip pain 08/24/2018  . AK (actinic keratosis) 08/24/2018  . Neoplasm of uncertain behavior 54/56/2563  . 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 Tendinopathy of right gluteus medius 07/13/2018  . Tendinopathy of left gluteus medius 07/13/2018  . Squamous cell carcinoma in situ (SCCIS) of skin 03/24/2018  . Skin lesion 03/17/2018  . Vitamin D deficiency 11/29/2017  . Stage 3 chronic kidney disease ([place]) 10/28/2017  . Primary osteoarthritis of right hip 09/11/2017  . Essential hypertension 07/18/2017  . Hyperlipidemia associated with type 2 diabetes mellitus ([place]) 07/18/2017  . Type 2 diabetes mellitus with diabetic neuropathy, unspecified ([place]) 07/18/2017  . Heart murmur 07/18/2017  . Hypertriglyceridemia 07/18/2017  . History of stroke 07/18/2017  . DDD (degenerative disc disease), lumbar 06/15/2017  . Iron deficiency anemia 06/15/2017  . Stroke ([place]) 06/15/2017  . Encounter for hepatitis C screening test for low risk patient 06/30/2016  . Anemia 06/19/2016  . Microalbuminuria due to type 2 diabetes mellitus ([place]) 03/19/2016  . Hypertrophy of inferior nasal turbinate 12/20/2015  . Nasal congestion 12/15/2015  . Disease of  nasal cavity and sinuses 12/10/2015  . Degenerative tear of acetabular labrum of left hip 11/04/2015  . Cellulitis and abscess of left leg 09/25/2015  . History of tobacco abuse 05/15/2015  . Morbid (severe) obesity  due to excess calories ([place]) 04/11/2015  . Disorder of both eustachian tubes 01/27/2015  . Maxillary sinusitis 01/13/2015  . Lipoprotein deficiency 01/31/2014  . Gastro-esophageal reflux disease without esophagitis 11/02/2013  . Rheumatic fever 11/02/2013  . Erectile dysfunction associated with type 2 diabetes mellitus ([place]) 07/31/2013  . Pain in joint, ankle and foot 02/06/2013  . Type 2 diabetes mellitus with hyperglycemia, without long-term current use of insulin ([place]) 11/16/2012    Past Medical History:  Diagnosis Date  . Anemia   . Essential hypertension 07/18/2017  . History of colonic polyps   . History of rheumatic fever   . History of stroke 07/18/2017  . Hypertriglyceridemia 07/18/2017  . Type 2 diabetes mellitus with diabetic neuropathy, unspecified ([place]) 07/18/2017    Family History  Problem Relation Age of Onset  . Hyperlipidemia Mother   . Colon polyps Mother   . Hyperlipidemia Father   . Alcoholism Brother   . Healthy Son   . Healthy Son   . Cancer Neg Hx   . Colon cancer Neg Hx   . Esophageal cancer Neg Hx   . Rectal cancer Neg Hx   . Stomach cancer Neg Hx    Past Surgical History:  Procedure Laterality Date  . COLONOSCOPY     around 2018 [facility]  . COLONOSCOPY  09/02/2020  . CRANIOTOMY Left 10/25/2019   Procedure: CRANIOTOMY HEMATOMA EVACUATION SUBDURAL;  Surgeon: Vallarie Mare, MD;  Location: [facility];  Service: Neurosurgery;  Laterality: Left;  . ESOPHAGOGASTRODUODENOSCOPY     around 2018 with [facility]  . FOOT CAPSULOTOMY Left 07/25/2008   Mid Foot #2 MPJ  . Hammertoe Repair Left 07/25/2008   #2 toe  . SPINAL FUSION    . TARSAL TUNNEL RELEASE Left 07/25/2008  . UPPER GASTROINTESTINAL ENDOSCOPY  09/02/2020   Social History   Social History Narrative  . Not on file   Immunization History  Administered Date(s) Administered  . Moderna SARS-COVID-2 Vaccination 12/11/2019, 01/10/2020, 08/08/2020  . Td 12/25/2018     Objective: Vital  Signs: BP (!) 144/85 (BP Location: Left Arm, Patient Position: Sitting, Cuff Size: Normal)   Pulse 71   Resp 17   Ht 5\' 10"  (1.778 m)   Wt 252 lb 12.8 oz (114.7 kg)   BMI 36.27 kg/m    Physical Exam Vitals and nursing note reviewed.  Constitutional:      Appearance: He is well-developed.  HENT:     Head: Normocephalic and atraumatic.  Eyes:     Conjunctiva/sclera: Conjunctivae normal.     Pupils: Pupils are equal, round, and reactive to light.  Cardiovascular:     Rate and Rhythm: Normal rate and regular rhythm.     Heart sounds: Normal heart sounds.  Pulmonary:     Effort: Pulmonary effort is normal.     Breath sounds: Normal breath sounds.  Abdominal:     General: Bowel sounds are normal.     Palpations: Abdomen is soft.  Musculoskeletal:     Cervical back: Normal range of motion and neck supple.  Skin:    General: Skin is warm and dry.     Capillary Refill: Capillary refill takes less than 2 seconds.  Neurological:     Mental Status: He is alert  and oriented to person, place, and time.  Psychiatric:        Behavior: Behavior normal.      Musculoskeletal Exam: He has good range of motion of cervical spine.  Shoulder joints with good range of motion.  He had contractures in his elbows with no synovitis.  Wrist joints with good range of motion.  He has bilateral PIP and DIP thickening with no synovitis.  No swelling was noted over MCPs.  He had limited painful range of motion of his hip joints.  He has good range of motion of his knee joints.  There was no tenderness over ankles or MTPs.  CDAI Exam: CDAI Score: 2.8  Patient Global: 5 mm; Provider Global: 3 mm Swollen: 0 ; Tender: 4  Joint Exam 09/08/2020      Right  Left  Hip   Tender   Tender  Knee   Tender   Tender   There is currently no information documented on the homunculus. Go to the Rheumatology activity and complete the homunculus joint exam.  Investigation: No additional findings.  Imaging: No  results found.  Recent Labs: Lab Results  Component Value Date   WBC 10.8 07/15/2020   HGB 8.8 (L) 07/15/2020   PLT 336 07/15/2020   NA 139 07/15/2020   K 4.8 07/15/2020   CL 106 07/15/2020   CO2 22 07/15/2020   GLUCOSE 130 (H) 07/15/2020   BUN 46 (H) 07/15/2020   CREATININE 1.59 (H) 07/15/2020   BILITOT 0.2 07/15/2020   ALKPHOS 41 05/16/2020   AST 14 07/15/2020   ALT 9 07/15/2020   PROT 6.3 07/15/2020   ALBUMIN 3.5 05/16/2020   CALCIUM 9.0 07/15/2020   GFRAA 48 (L) 07/15/2020   QFTBGOLDPLUS NEGATIVE 04/17/2020    Speciality Comments: No specialty comments available.  Procedures:  No procedures performed Allergies: Influenza vaccines, Atorvastatin, Canagliflozin, and Statins   Assessment / Plan:     Visit Diagnoses: Rheumatoid arthritis involving multiple sites with positive rheumatoid factor (HCC) - Positive RF, positive anti-CCP, elevated sedimentation rate, history of inflammatory arthritis.  He had no synovitis on examination today.  He continues to have pain and discomfort due to underlying osteoarthritis.  High risk medication use - Arava 10 mg 1 tablet by mouth daily and Enbrel mini 50 mg sq injections once weekly.  His labs have been stable.  He has severe anemia.  He also has low GFR which has been stable.  Contracture of right elbow-olecranon bursitis has resolved but he has contractures in his both elbows right more than left.  Primary osteoarthritis of both hips - Moderate to severe osteoarthritis of both hips.  He has severe pain and discomfort in his bilateral hip joints.  Primary osteoarthritis of left knee - Mild osteoarthritis.   Chronic SI joint pain-he continues to have some lower back pain.  DDD (degenerative disc disease), lumbar - L4-L5 fusion by Dr.Cohen.  Facet joint arthropathy.   Essential hypertension-his blood pressure is a still elevated.  Granuloma annulare  History of stroke - At age 74 with left-sided weakness  SDH (subdural  hematoma) (HCC)  Heart murmur  Type 2 diabetes mellitus with diabetic neuropathy, with long-term current use of insulin ([place])  Hyperlipidemia associated with type 2 diabetes mellitus (HCC)-weight loss diet and exercise was emphasized.  Squamous cell carcinoma in situ (SCCIS) of skin  Orders: No orders of the defined types were placed in this encounter.  No orders of the defined types were placed in this encounter. Marland Kitchen  Follow-Up Instructions: Return in about 5 months (around 02/06/2021) for Rheumatoid arthritis, Osteoarthritis.   Bo Merino, MD  Note - This record has been created using Editor, commissioning.  Chart creation errors have been sought, but may not always  have been located. Such creation errors do not reflect on  the standard of medical care.

## 2020-08-26 ENCOUNTER — Ambulatory Visit: Payer: Medicare HMO | Admitting: Rheumatology

## 2020-09-01 ENCOUNTER — Other Ambulatory Visit: Payer: Self-pay | Admitting: Nephrology

## 2020-09-01 DIAGNOSIS — N1832 Chronic kidney disease, stage 3b: Secondary | ICD-10-CM

## 2020-09-01 DIAGNOSIS — N189 Chronic kidney disease, unspecified: Secondary | ICD-10-CM

## 2020-09-01 DIAGNOSIS — M899 Disorder of bone, unspecified: Secondary | ICD-10-CM

## 2020-09-02 ENCOUNTER — Encounter: Payer: Self-pay | Admitting: Gastroenterology

## 2020-09-02 ENCOUNTER — Other Ambulatory Visit: Payer: Self-pay

## 2020-09-02 ENCOUNTER — Ambulatory Visit (AMBULATORY_SURGERY_CENTER): Payer: Medicare HMO | Admitting: Gastroenterology

## 2020-09-02 VITALS — BP 113/76 | HR 69 | Temp 96.6°F | Resp 19 | Ht 71.0 in | Wt 249.0 lb

## 2020-09-02 DIAGNOSIS — K222 Esophageal obstruction: Secondary | ICD-10-CM | POA: Diagnosis not present

## 2020-09-02 DIAGNOSIS — K219 Gastro-esophageal reflux disease without esophagitis: Secondary | ICD-10-CM | POA: Diagnosis not present

## 2020-09-02 DIAGNOSIS — D123 Benign neoplasm of transverse colon: Secondary | ICD-10-CM

## 2020-09-02 DIAGNOSIS — K317 Polyp of stomach and duodenum: Secondary | ICD-10-CM | POA: Diagnosis not present

## 2020-09-02 DIAGNOSIS — I8500 Esophageal varices without bleeding: Secondary | ICD-10-CM | POA: Diagnosis not present

## 2020-09-02 DIAGNOSIS — D124 Benign neoplasm of descending colon: Secondary | ICD-10-CM | POA: Diagnosis not present

## 2020-09-02 DIAGNOSIS — K640 First degree hemorrhoids: Secondary | ICD-10-CM

## 2020-09-02 DIAGNOSIS — K5730 Diverticulosis of large intestine without perforation or abscess without bleeding: Secondary | ICD-10-CM

## 2020-09-02 DIAGNOSIS — D509 Iron deficiency anemia, unspecified: Secondary | ICD-10-CM | POA: Diagnosis not present

## 2020-09-02 HISTORY — PX: UPPER GASTROINTESTINAL ENDOSCOPY: SHX188

## 2020-09-02 HISTORY — PX: COLONOSCOPY: SHX174

## 2020-09-02 MED ORDER — SODIUM CHLORIDE 0.9 % IV SOLN: 500.0000 mL | Freq: Once | INTRAVENOUS | Status: DC

## 2020-09-02 NOTE — Op Note (Signed)
[facility] Patient Name: Tommy Green Procedure Date: 09/02/2020 10:26 AM MRN: [id number] Endoscopist: Gerrit Heck , MD Age: 74 Referring MD:  Date of Birth: July 03, 1946 Gender: Male Account #: [id number] Procedure:                Upper GI endoscopy Indications:              Iron deficiency anemia Medicines:                Monitored Anesthesia Care Procedure:                Pre-Anesthesia Assessment:                           - Prior to the procedure, a History and Physical                            was performed, and patient medications and                            allergies were reviewed. The patient's tolerance of                            previous anesthesia was also reviewed. The risks                            and benefits of the procedure and the sedation                            options and risks were discussed with the patient.                            All questions were answered, and informed consent                            was obtained. Prior Anticoagulants: The patient has                            taken no previous anticoagulant or antiplatelet                            agents. ASA Grade Assessment: III - A patient with                            severe systemic disease. After reviewing the risks                            and benefits, the patient was deemed in                            satisfactory condition to undergo the procedure.                           After obtaining informed consent, the endoscope was  passed under direct vision. Throughout the                            procedure, the patient's blood pressure, pulse, and                            oxygen saturations were monitored continuously. The                            Endoscope was introduced through the mouth, and                            advanced to the second part of duodenum. The upper                            GI endoscopy was  accomplished without difficulty.                            The patient tolerated the procedure well. Scope In: Scope Out: Findings:                 Grade I varices with no stigmata of recent bleeding                            were found in the upper third of the esophagus.                           A non-obstructing Schatzki ring was found in the                            lower third of the esophagus.                           Multiple small sessile polyps with no bleeding and                            no stigmata of recent bleeding were found in the                            gastric fundus and in the gastric body. Several of                            these polyps were removed with a cold biopsy                            forceps for histologic representative evaluation.                            Resection and retrieval were complete. Estimated                            blood loss was minimal.  The examined duodenum was normal. Biopsies for                            histology were taken with a cold forceps for                            evaluation of celiac disease. Estimated blood loss                            was minimal. Complications:            No immediate complications. Estimated Blood Loss:     Estimated blood loss was minimal. Impression:               - Grade I esophageal varices with no stigmata of                            recent bleeding. The location and appearance are                            suspicious for "downhill varices". Will evaluate                            further with imaging as below. No varices noted in                            the lower esophagus and no evidence of portal                            hypertensive gastropathy.                           - Non-obstructing Schatzki ring.                           - Multiple gastric polyps. Resected and retrieved.                           - Normal examined duodenum.  Biopsied. Recommendation:           - Patient has a contact number available for                            emergencies. The signs and symptoms of potential                            delayed complications were discussed with the                            patient. Return to normal activities tomorrow.                            Written discharge instructions were provided to the                            patient.                           -  Resume previous diet.                           - Continue present medications.                           - Await pathology results.                           - Perform a colonoscopy today.                           - Perform CT scan (computed tomography) of the                            chest with contrast at appointment to be scheduled.                           - Return to GI clinic at appointment to be                            scheduled.                           - If colonoscopy, pathology results, and CT are                            unrevealing for etiology of iron deficiency, will                            plan for Video Capsule Endoscopy (VCE) for small                            bowel interrogation. Gerrit Heck, MD 09/02/2020 11:28:26 AM

## 2020-09-02 NOTE — Progress Notes (Signed)
PT taken to PACU. Monitors in place. VSS. Report given to RN. 

## 2020-09-02 NOTE — Op Note (Signed)
[facility] Patient Name: Tommy Green Procedure Date: 09/02/2020 10:26 AM MRN: [id number] Endoscopist: Gerrit Heck , MD Age: 74 Referring MD:  Date of Birth: [birth year] Gender: Male Account #: [id number] Procedure:                Colonoscopy Indications:              Iron deficiency anemia Medicines:                Monitored Anesthesia Care Procedure:                Pre-Anesthesia Assessment:                           - Prior to the procedure, a History and Physical                            was performed, and patient medications and                            allergies were reviewed. The patient's tolerance of                            previous anesthesia was also reviewed. The risks                            and benefits of the procedure and the sedation                            options and risks were discussed with the patient.                            All questions were answered, and informed consent                            was obtained. Prior Anticoagulants: The patient has                            taken no previous anticoagulant or antiplatelet                            agents. ASA Grade Assessment: III - A patient with                            severe systemic disease. After reviewing the risks                            and benefits, the patient was deemed in                            satisfactory condition to undergo the procedure.                           After obtaining informed consent, the colonoscope  was passed under direct vision. Throughout the                            procedure, the patient's blood pressure, pulse, and                            oxygen saturations were monitored continuously. The                            Colonoscope was introduced through the anus and                            advanced to the the cecum, identified by                            appendiceal orifice and ileocecal valve.  The                            colonoscopy was performed without difficulty. The                            patient tolerated the procedure well. The quality                            of the bowel preparation was good. The ileocecal                            valve, appendiceal orifice, and rectum were                            photographed. Scope In: 10:54:23 AM Scope Out: 11:15:18 AM Scope Withdrawal Time: 0 hours 13 minutes 41 seconds  Total Procedure Duration: 0 hours 20 minutes 55 seconds  Findings:                 The perianal and digital rectal examinations were                            normal.                           Two sessile polyps were found in the descending                            colon and transverse colon. The polyps were 3 to 5                            mm in size. These polyps were removed with a cold                            snare. Resection and retrieval were complete.                            Estimated blood loss was minimal.  Multiple small and large-mouthed diverticula were                            found in the sigmoid colon.                           Blue blebs noted in the rectum without frank rectal                            varices. Non-bleeding internal hemorrhoids were                            found during retroflexion. The hemorrhoids were                            small and Grade I (internal hemorrhoids that do not                            prolapse).                           The ascending colon revealed moderately excessive                            looping. Advancing the scope required using manual                            pressure. Complications:            No immediate complications. Estimated Blood Loss:     Estimated blood loss was minimal. Impression:               - Two 3 to 5 mm polyps in the descending colon and                            in the transverse colon, removed with a cold snare.                             Resected and retrieved.                           - Diverticulosis in the sigmoid colon.                           - Non-bleeding internal hemorrhoids.                           - There was significant looping of the colon. Recommendation:           - Patient has a contact number available for                            emergencies. The signs and symptoms of potential                            delayed complications were discussed with the  patient. Return to normal activities tomorrow.                            Written discharge instructions were provided to the                            patient.                           - Resume previous diet.                           - Continue present medications.                           - Await pathology results.                           - Repeat colonoscopy for surveillance based on                            pathology results.                           - Return to GI office at appointment to be                            scheduled. Gerrit Heck, MD 09/02/2020 11:32:01 AM

## 2020-09-02 NOTE — Patient Instructions (Signed)
Please read handouts provided. Continue present medications. Await pathology results. Return to GI clinic at appointment to be scheduled. Chest CT to be scheduled.       YOU HAD AN ENDOSCOPIC PROCEDURE TODAY AT [facility]:   Refer to the procedure report that was given to you for any specific questions about what was found during the examination.  If the procedure report does not answer your questions, please call your gastroenterologist to clarify.  If you requested that your care partner not be given the details of your procedure findings, then the procedure report has been included in a sealed envelope for you to review at your convenience later.  YOU SHOULD EXPECT: Some feelings of bloating in the abdomen. Passage of more gas than usual.  Walking can help get rid of the air that was put into your GI tract during the procedure and reduce the bloating. If you had a lower endoscopy (such as a colonoscopy or flexible sigmoidoscopy) you may notice spotting of blood in your stool or on the toilet paper. If you underwent a bowel prep for your procedure, you may not have a normal bowel movement for a few days.  Please Note:  You might notice some irritation and congestion in your nose or some drainage.  This is from the oxygen used during your procedure.  There is no need for concern and it should clear up in a day or so.  SYMPTOMS TO REPORT IMMEDIATELY:   Following lower endoscopy (colonoscopy or flexible sigmoidoscopy):  Excessive amounts of blood in the stool  Significant tenderness or worsening of abdominal pains  Swelling of the abdomen that is new, acute  Fever of 100F or higher   Following upper endoscopy (EGD)  Vomiting of blood or coffee ground material  New chest pain or pain under the shoulder blades  Painful or persistently difficult swallowing  New shortness of breath  Fever of 100F or higher  Black, tarry-looking stools  For urgent or emergent issues,  a gastroenterologist can be reached at any hour by calling [phone]. Do not use MyChart messaging for urgent concerns.    DIET:  We do recommend a small meal at first, but then you may proceed to your regular diet.  Drink plenty of fluids but you should avoid alcoholic beverages for 24 hours.  ACTIVITY:  You should plan to take it easy for the rest of today and you should NOT DRIVE or use heavy machinery until tomorrow (because of the sedation medicines used during the test).    FOLLOW UP: Our staff will call the number listed on your records 48-72 hours following your procedure to check on you and address any questions or concerns that you may have regarding the information given to you following your procedure. If we do not reach you, we will leave a message.  We will attempt to reach you two times.  During this call, we will ask if you have developed any symptoms of COVID 19. If you develop any symptoms (ie: fever, flu-like symptoms, shortness of breath, cough etc.) before then, please call [phone].  If you test positive for Covid 19 in the 2 weeks post procedure, please call and report this information to [place].    If any biopsies were taken you will be contacted by phone or by letter within the next 1-3 weeks.  Please call us at [phone] if you have not heard about the biopsies in 3 weeks.    SIGNATURES/CONFIDENTIALITY:  You and/or your care partner have signed paperwork which will be entered into your electronic medical record.  These signatures attest to the fact that that the information above on your After Visit Summary has been reviewed and is understood.  Full responsibility of the confidentiality of this discharge information lies with you and/or your care-partner.

## 2020-09-02 NOTE — Progress Notes (Signed)
Called to room to assist during endoscopic procedure.  Patient ID and intended procedure confirmed with present staff. Received instructions for my participation in the procedure from the performing physician.  

## 2020-09-02 NOTE — Progress Notes (Signed)
Pt's states no medical or surgical changes since previsit or office visit.  [place] 

## 2020-09-04 ENCOUNTER — Telehealth: Payer: Self-pay

## 2020-09-04 NOTE — Telephone Encounter (Signed)
  Follow up Call-  Call back number 09/02/2020  Post procedure Call Back phone  # [phone]  Permission to leave phone message Yes  Some recent data might be hidden     Patient questions:  Do you have a fever, pain , or abdominal swelling? No. Pain Score  0 *  Have you tolerated food without any problems? Yes.    Have you been able to return to your normal activities? Yes.    Do you have any questions about your discharge instructions: Diet   No. Medications  No. Follow up visit  No.  Do you have questions or concerns about your Care? No.  Actions: * If pain score is 4 or above: No action needed, pain <4.  1. Have you developed a fever since your procedure? no  2.   Have you had an respiratory symptoms (SOB or cough) since your procedure? no  3.   Have you tested positive for COVID 19 since your procedure no  4.   Have you had any family members/close contacts diagnosed with the COVID 19 since your procedure?  no   If yes to any of these questions please route to Joylene John, RN and Joella Prince, RN

## 2020-09-05 ENCOUNTER — Telehealth: Payer: Self-pay | Admitting: Gastroenterology

## 2020-09-05 NOTE — Telephone Encounter (Signed)
After reviewing patients recent EGD procedure report from 09/02/20 and Dr Vivia Ewing recommendations, patient was notified to discuss availability for the chest CT. Patient stated that he was declining any further testing at this time. He was offered a follow up appointment to discuss the findings on the report and why these test were recommended. Patient declined. He was informed that Dr Bryan Lemma would be notified of his decisions and to call the office if there is anything he needs. Patient voiced understanding.

## 2020-09-08 ENCOUNTER — Encounter: Payer: Self-pay | Admitting: Rheumatology

## 2020-09-08 ENCOUNTER — Ambulatory Visit: Payer: Medicare HMO | Admitting: Rheumatology

## 2020-09-08 ENCOUNTER — Other Ambulatory Visit: Payer: Self-pay

## 2020-09-08 VITALS — BP 144/85 | HR 71 | Resp 17 | Ht 70.0 in | Wt 252.8 lb

## 2020-09-08 DIAGNOSIS — M1712 Unilateral primary osteoarthritis, left knee: Secondary | ICD-10-CM | POA: Diagnosis not present

## 2020-09-08 DIAGNOSIS — M5136 Other intervertebral disc degeneration, lumbar region: Secondary | ICD-10-CM

## 2020-09-08 DIAGNOSIS — M0579 Rheumatoid arthritis with rheumatoid factor of multiple sites without organ or systems involvement: Secondary | ICD-10-CM

## 2020-09-08 DIAGNOSIS — L920 Granuloma annulare: Secondary | ICD-10-CM | POA: Diagnosis not present

## 2020-09-08 DIAGNOSIS — M533 Sacrococcygeal disorders, not elsewhere classified: Secondary | ICD-10-CM

## 2020-09-08 DIAGNOSIS — M24521 Contracture, right elbow: Secondary | ICD-10-CM

## 2020-09-08 DIAGNOSIS — Z79899 Other long term (current) drug therapy: Secondary | ICD-10-CM | POA: Diagnosis not present

## 2020-09-08 DIAGNOSIS — S065XAA Traumatic subdural hemorrhage with loss of consciousness status unknown, initial encounter: Secondary | ICD-10-CM

## 2020-09-08 DIAGNOSIS — E785 Hyperlipidemia, unspecified: Secondary | ICD-10-CM

## 2020-09-08 DIAGNOSIS — Z8673 Personal history of transient ischemic attack (TIA), and cerebral infarction without residual deficits: Secondary | ICD-10-CM

## 2020-09-08 DIAGNOSIS — M160 Bilateral primary osteoarthritis of hip: Secondary | ICD-10-CM | POA: Diagnosis not present

## 2020-09-08 DIAGNOSIS — Z794 Long term (current) use of insulin: Secondary | ICD-10-CM

## 2020-09-08 DIAGNOSIS — S065X9A Traumatic subdural hemorrhage with loss of consciousness of unspecified duration, initial encounter: Secondary | ICD-10-CM

## 2020-09-08 DIAGNOSIS — I10 Essential (primary) hypertension: Secondary | ICD-10-CM | POA: Diagnosis not present

## 2020-09-08 DIAGNOSIS — E1169 Type 2 diabetes mellitus with other specified complication: Secondary | ICD-10-CM

## 2020-09-08 DIAGNOSIS — M7021 Olecranon bursitis, right elbow: Secondary | ICD-10-CM

## 2020-09-08 DIAGNOSIS — G8929 Other chronic pain: Secondary | ICD-10-CM

## 2020-09-08 DIAGNOSIS — R011 Cardiac murmur, unspecified: Secondary | ICD-10-CM

## 2020-09-08 DIAGNOSIS — E1140 Type 2 diabetes mellitus with diabetic neuropathy, unspecified: Secondary | ICD-10-CM

## 2020-09-08 DIAGNOSIS — D049 Carcinoma in situ of skin, unspecified: Secondary | ICD-10-CM

## 2020-09-08 MED ORDER — ENBREL MINI 50 MG/ML ~~LOC~~ SOCT: 50.0000 mg | SUBCUTANEOUS | 0 refills | Status: DC

## 2020-09-08 NOTE — Progress Notes (Unsigned)
Patient requested refill of enbrel today at the appointment.   Last Visit: 09/08/2020 Next Visit: 02/02/2021 Labs: 07/15/2020 RBC count, Hgb, and hct are low. Hgb has dropped from 9.3 to 8.8. He has a history of chronic anemia likely secondary to chronic kidney disease. Creatinine remains elevated and GFR is low-42. TB Gold: 04/17/2020 negative   Current Dose per office note on 09/08/2020: Enbrel mini 50 mg sq injections once weekly.   TW:[id number] arthritis involving multiple sites with positive rheumatoid factor   Okay to refill enbrel mini?

## 2020-09-08 NOTE — Patient Instructions (Signed)
Standing Labs We placed an order today for your standing lab work.   Please have your standing labs drawn in  December and every 3 months  If possible, please have your labs drawn 2 weeks prior to your appointment so that the provider can discuss your results at your appointment.  We have open lab daily Monday through Thursday from 8:30-12:30 PM and 1:30-4:30 PM and Friday from 8:30-12:30 PM and 1:30-4:00 PM at the office of Dr. Bo Merino, [facility].   Please be advised, patients with office appointments requiring lab work will take precedents over walk-in lab work.  If possible, please come for your lab work on Monday and Friday afternoons, as you may experience shorter wait times. The office is located at [address], [place], [place], [place] [address] No appointment is necessary.   Labs are drawn by [facility]. Please bring your co-pay at the time of your lab draw.  You may receive a bill from [facility] for your lab work.  If you wish to have your labs drawn at another location, please call the office 24 hours in advance to send orders.  If you have any questions regarding directions or hours of operation,  please call [phone].   As a reminder, please drink plenty of water prior to coming for your lab work. Thanks!

## 2020-09-10 ENCOUNTER — Ambulatory Visit
Admission: RE | Admit: 2020-09-10 | Discharge: 2020-09-10 | Disposition: A | Discharge status: Home / Self Care | Payer: Medicare HMO | Source: Ambulatory Visit | Attending: Neurosurgery | Admitting: Neurosurgery

## 2020-09-10 DIAGNOSIS — S065X9A Traumatic subdural hemorrhage with loss of consciousness of unspecified duration, initial encounter: Secondary | ICD-10-CM

## 2020-09-10 DIAGNOSIS — S065XAA Traumatic subdural hemorrhage with loss of consciousness status unknown, initial encounter: Secondary | ICD-10-CM

## 2020-09-10 DIAGNOSIS — G459 Transient cerebral ischemic attack, unspecified: Secondary | ICD-10-CM | POA: Diagnosis not present

## 2020-09-10 IMAGING — CT CT HEAD W/O CM
1 series · 15 of 30 positions shown, 19 images · non-contrast
Comparison: Head CTs [DATE] and earlier.

CLINICAL DATA: 74-year-old male status post left side subdural
hematoma, surgical evacuation in [REDACTED].

EXAM:
CT HEAD WITHOUT CONTRAST
TECHNIQUE: Contiguous axial images were obtained from the base of the skull
through the vertex without intravenous contrast.

[Series 2: head w/(date) · axial · 0.48mm/px · z∈[-209,-49]mm · 15 of 36 slices shown, 19 images]
[im 2/36  brain]
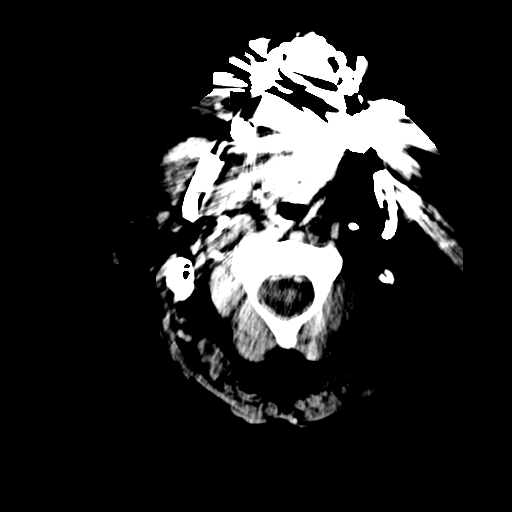
[im 2/36  bone]
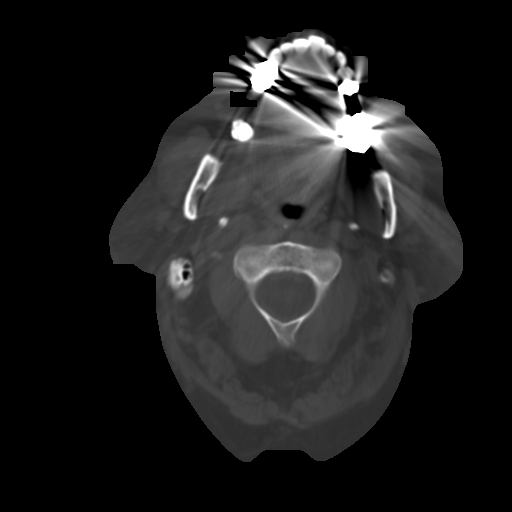
[im 4/36  brain]
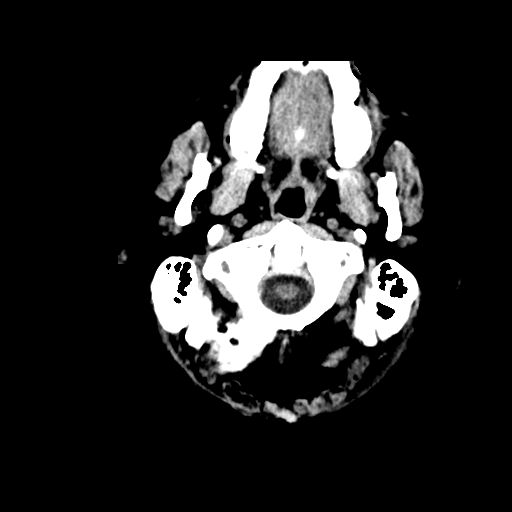
[im 7/36  brain]
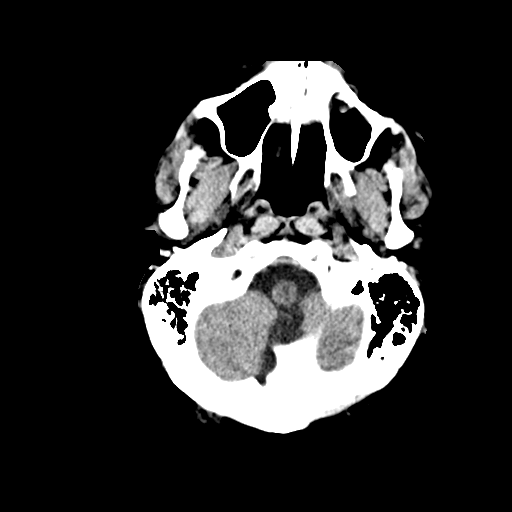
[im 9/36  brain]
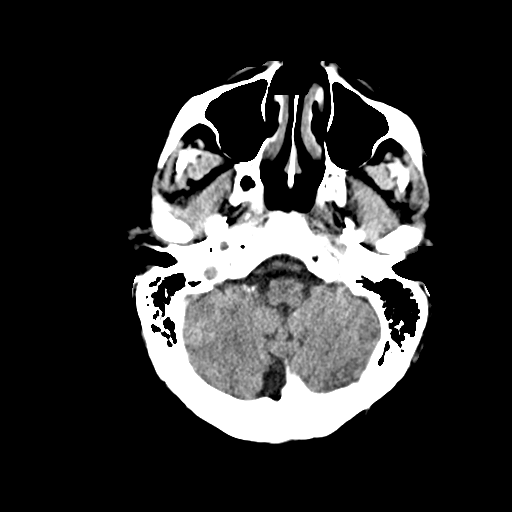
[im 11/36  brain]
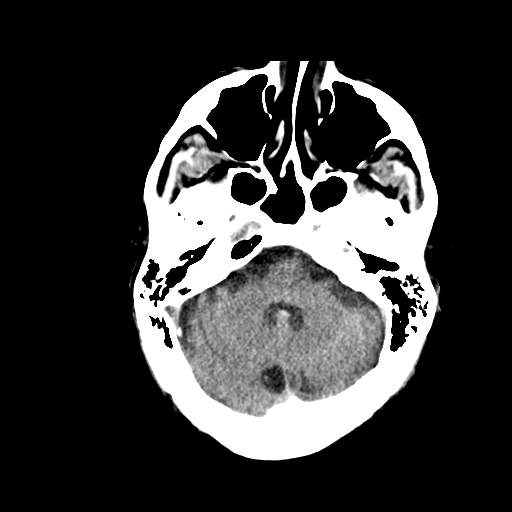
[im 11/36  bone]
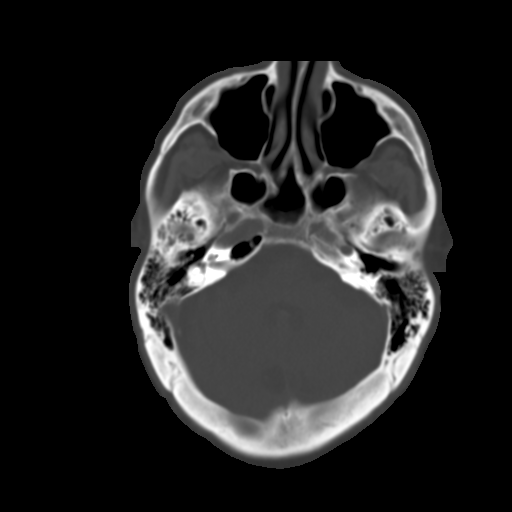
[im 14/36  brain]
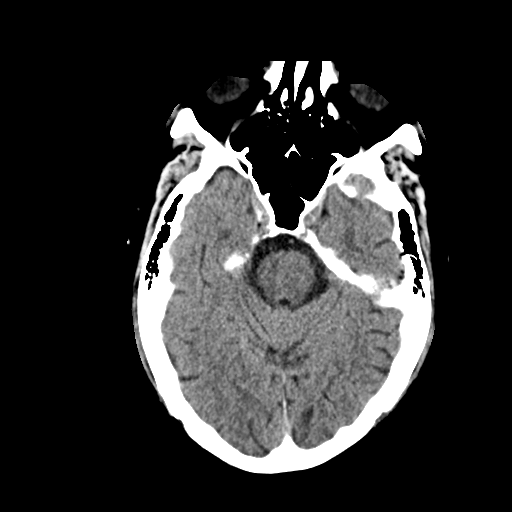
[im 16/36  brain]
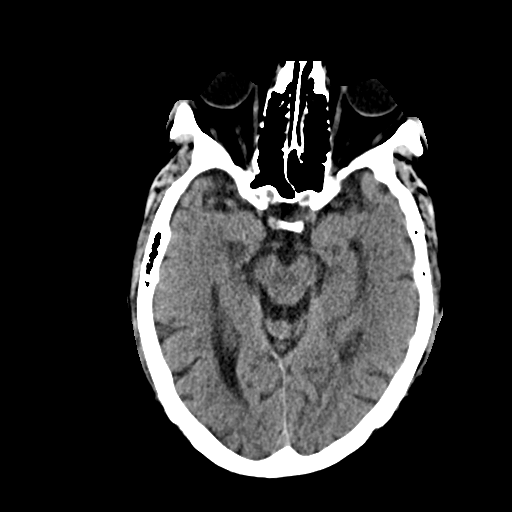
[im 19/36  brain]
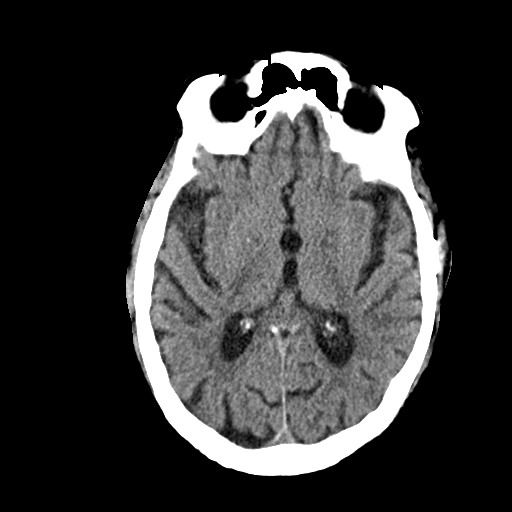
[im 20/36  brain]
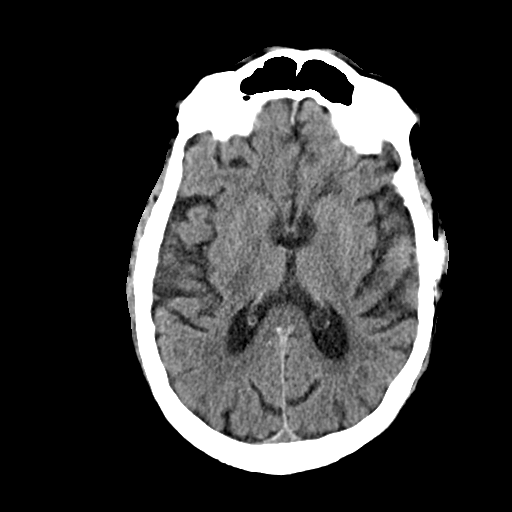
[im 20/36  bone]
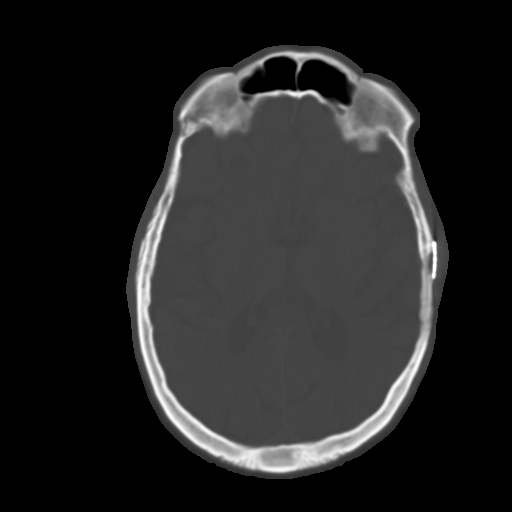
[im 22/36  brain]
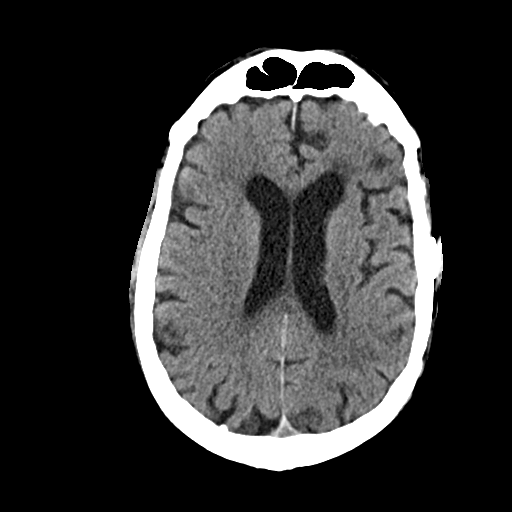
[im 25/36  brain]
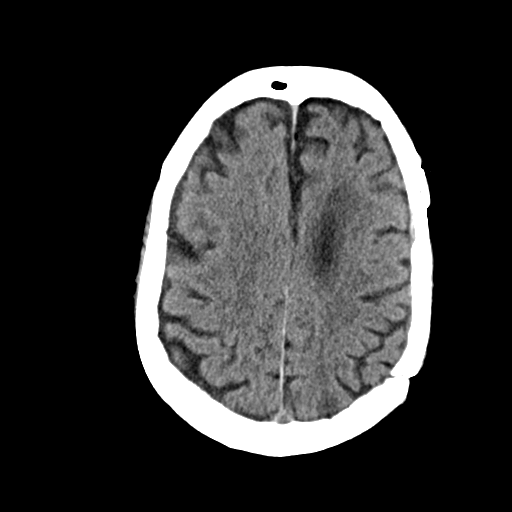
[im 27/36  brain]
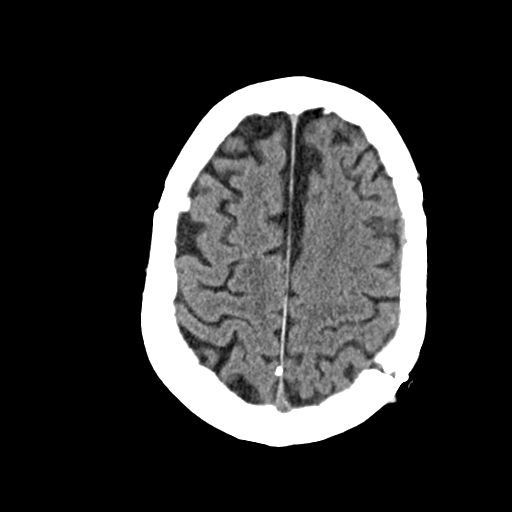
[im 29/36  brain]
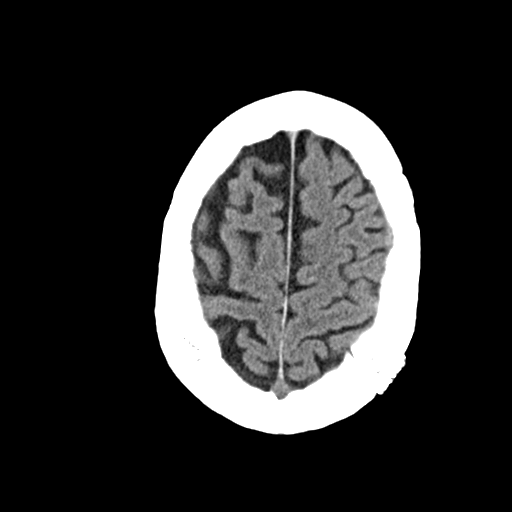
[im 29/36  bone]
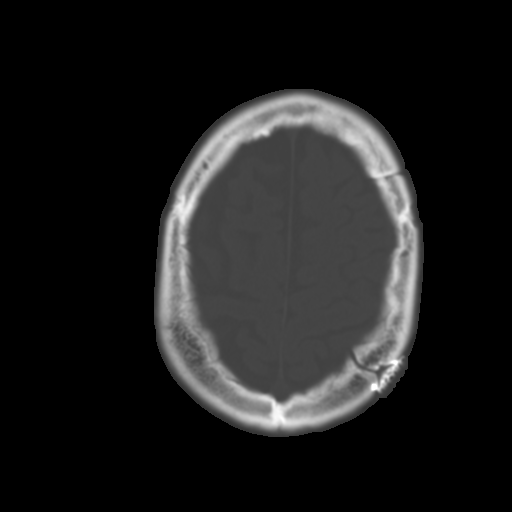
[im 32/36  brain]
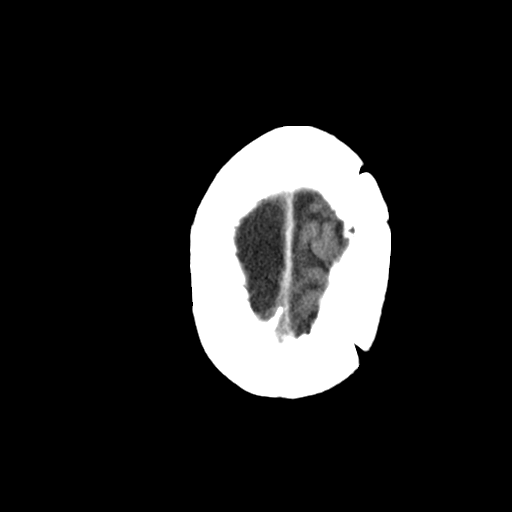
[im 34/36  brain]
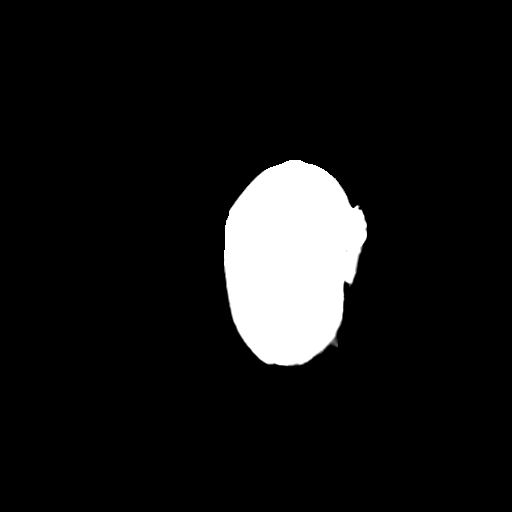

[15 of 30 positions shown; findings below may reference images not displayed]

FINDINGS: Brain: Resolved left side subdural hematoma. Mild linear dural
hyperdensity underlying the craniotomy likely is surgical dural
thickening/repair (series 4, image 33).

Resolved small volume intraventricular hemorrhage. No acute
intracranial hemorrhage identified.

No midline shift, mass effect, or evidence of intracranial mass
lesion. No ventriculomegaly.

Gray-white matter differentiation appears stable, including Patchy
and confluent white matter hypodensity along the left frontal horn.
No cortically based acute infarct identified.

Vascular: Mild Calcified atherosclerosis at the skull base. No
suspicious intracranial vascular hyperdensity.

Skull: Stable previous left side craniotomy.

Sinuses/Orbits: Visualized paranasal sinuses and mastoids are stable
and well pneumatized. Chronic alveolar recess mucosal thickening
which may be associated with chronic left maxillary dental disease
is unchanged (series 3, image 15).

Other: No acute orbit or scalp soft tissue finding.
IMPRESSION: 1. Previous craniotomy with underlying dural thickening/repair.
Resolved left subdural hematoma and small volume intraventricular
hemorrhage.

2. No new intracranial abnormality.

## 2020-09-12 ENCOUNTER — Other Ambulatory Visit: Payer: Self-pay | Admitting: Family Medicine

## 2020-09-15 ENCOUNTER — Telehealth: Payer: Self-pay

## 2020-09-15 ENCOUNTER — Ambulatory Visit: Payer: Medicare HMO | Admitting: Orthopaedic Surgery

## 2020-09-15 ENCOUNTER — Other Ambulatory Visit: Payer: Self-pay

## 2020-09-15 ENCOUNTER — Ambulatory Visit: Payer: Self-pay

## 2020-09-15 VITALS — Ht 70.0 in | Wt 252.8 lb

## 2020-09-15 DIAGNOSIS — M25551 Pain in right hip: Secondary | ICD-10-CM

## 2020-09-15 DIAGNOSIS — M25552 Pain in left hip: Secondary | ICD-10-CM | POA: Diagnosis not present

## 2020-09-15 NOTE — Progress Notes (Signed)
Office Visit Note   Patient: Tommy Green           Date of Birth: 1946/03/05           MRN: [id number] Visit Date: 09/15/2020              Requested by: [name], DO [address] [place],  [place] [address] PCP: [name], DO   Assessment & Plan: Visit Diagnoses:  1. Pain in left hip   2. Pain in right hip     Plan: I do think it is worth trying an intra-articular steroid injection of at least his left hip to see how he does with this clinically.  Hopefully this will be a diagnostic and therapeutic injection.  Also feel that he would benefit from formal outpatient physical therapy to work on core strengthening as well as any modalities that can decrease his low back pain and is hip pain bilaterally.  They can also work on hip strengthening and generalized balance and coordination.  We will see if Dr. Junius Roads can provide a steroid injection under ultrasound in his left hip and then I will see him back myself in about 3 weeks.  All question concerns were answered and addressed.  He is actually seen Dr. Junius Roads before for other sports medicine issues in the past.  Follow-Up Instructions: Return in about 3 weeks (around 10/06/2020).   Orders:  No orders of the defined types were placed in this encounter.  No orders of the defined types were placed in this encounter.     Procedures: No procedures performed   Clinical Data: No additional findings.   Subjective: Chief Complaint  Patient presents with  . Left Hip - Pain  . Right Hip - Pain  Patient is a very pleasant 74 year old gentleman that I am seeing for the first time.  He does have a history of rheumatoid disease and has had several lumbar spine surgeries in the past.  He comes to me today for evaluation treatment of bilateral hip pain.  He points to the groin on his left hip and some on the right hip but mainly on the trochanteric areas in the posterior pelvis and low back as a  source of his pain.  He does ambulate with a walker.  His BMI is 36.  He is significantly deconditioned.  He unfortunately had some type of "brain bleed" earlier this year and this did require a craniotomy.  He had a rehab some after that.  He does report daily pain and it is quite difficult with his quality of life and his mobility as well as his actives daily living.  He would like to be able to mobilize better than he does.  He has not had therapy actively in a long period of time other than his neuro rehab.  He is now not on blood thinning medication.  He is a diabetic and reports his last hemoglobin A1c is 7.1.  His left hip seems to hurt worse to him than his right hip.  HPI  Review of Systems He currently denies any headache, chest pain, shortness of breath, fever, chills, nausea, vomiting  Objective: Vital Signs: Ht 5\' 10"  (1.778 m)   Wt 252 lb 12.8 oz (114.7 kg)   BMI 36.27 kg/m   Physical Exam He is alert and orient x3 and in no acute distress he does ambulate slowly with a walker. Ortho Exam Examination of both hips shows  that he has good internal and external rotation of both hips.  He really has some pain in the groin only on the left side.  There are some pain over the trochanteric areas as well.  He does have some associated low back pain and sciatic pain on both sides as well as SI joint pain bilaterally. Specialty Comments:  No specialty comments available.  Imaging: No results found. X-rays independently reviewed of his pelvis and both hips in June of this year show some central joint space narrowing but the superior lateral aspects are still open.  There are some slight flattening of the lateral femoral head on the left side.  The right hip appears with only some slight central narrowing.  PMFS History: Patient Active Problem List   Diagnosis Date Noted  . Sore throat 12/14/2019  . Acute kidney injury superimposed on chronic kidney disease ([place])   . Respiratory failure  ([place])   . Acute encephalopathy   . Hypernatremia   . Dyspnea   . Acute respiratory failure with hypoxemia ([place])   . SDH (subdural hematoma) ([place]) 10/24/2019  . Chronic pain of right knee 10/03/2019  . Cyclic vomiting syndrome 08/06/2019  . Neuropathy 12/25/2018  . Bilateral hip pain 08/24/2018  . AK (actinic keratosis) 08/24/2018  . Neoplasm of uncertain behavior 44/10/270  . 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 Tendinopathy of right gluteus medius 07/13/2018  . Tendinopathy of left gluteus medius 07/13/2018  . Squamous cell carcinoma in situ (SCCIS) of skin 03/24/2018  . Skin lesion 03/17/2018  . Vitamin D deficiency 11/29/2017  . Stage 3 chronic kidney disease ([place]) 10/28/2017  . Primary osteoarthritis of right hip 09/11/2017  . Essential hypertension 07/18/2017  . Hyperlipidemia associated with type 2 diabetes mellitus ([place]) 07/18/2017  . Type 2 diabetes mellitus with diabetic neuropathy, unspecified ([place]) 07/18/2017  . Heart murmur 07/18/2017  . Hypertriglyceridemia 07/18/2017  . History of stroke 07/18/2017  . DDD (degenerative disc disease), lumbar 06/15/2017  . Iron deficiency anemia 06/15/2017  . Stroke ([place]) 06/15/2017  . Encounter for hepatitis C screening test for low risk patient 06/30/2016  . Anemia 06/19/2016  . Microalbuminuria due to type 2 diabetes mellitus ([place]) 03/19/2016  . Hypertrophy of inferior nasal turbinate 12/20/2015  . Nasal congestion 12/15/2015  . Disease of nasal cavity and sinuses 12/10/2015  . Degenerative tear of acetabular labrum of left hip 11/04/2015  . Cellulitis and abscess of left leg 09/25/2015  . History of tobacco abuse 05/15/2015  . Morbid (severe) obesity due to excess calories ([place]) 04/11/2015  . Disorder of both eustachian tubes 01/27/2015  . Maxillary sinusitis 01/13/2015  . Lipoprotein deficiency 01/31/2014  . Gastro-esophageal reflux disease without esophagitis 11/02/2013  . Rheumatic fever 11/02/2013  . Erectile dysfunction  associated with type 2 diabetes mellitus ([place]) 07/31/2013  . Pain in joint, ankle and foot 02/06/2013  . Type 2 diabetes mellitus with hyperglycemia, without long-term current use of insulin ([place]) 11/16/2012   Past Medical History:  Diagnosis Date  . Anemia   . Essential hypertension 07/18/2017  . History of colonic polyps   . History of rheumatic fever   . History of stroke 07/18/2017  . Hypertriglyceridemia 07/18/2017  . Type 2 diabetes mellitus with diabetic neuropathy, unspecified ([place]) 07/18/2017    Family History  Problem Relation Age of Onset  . Hyperlipidemia Mother   . Colon polyps Mother   . Hyperlipidemia Father   . Alcoholism Brother   . Healthy Son   . Healthy Son   .  Cancer Neg Hx   . Colon cancer Neg Hx   . Esophageal cancer Neg Hx   . Rectal cancer Neg Hx   . Stomach cancer Neg Hx     Past Surgical History:  Procedure Laterality Date  . COLONOSCOPY     around 2018 [facility]  . COLONOSCOPY  09/02/2020  . CRANIOTOMY Left 10/25/2019   Procedure: CRANIOTOMY HEMATOMA EVACUATION SUBDURAL;  Surgeon: Vallarie Mare, MD;  Location: [facility];  Service: Neurosurgery;  Laterality: Left;  . ESOPHAGOGASTRODUODENOSCOPY     around 2018 with [facility]  . FOOT CAPSULOTOMY Left 07/25/2008   Mid Foot #2 MPJ  . Hammertoe Repair Left 07/25/2008   #2 toe  . SPINAL FUSION    . TARSAL TUNNEL RELEASE Left 07/25/2008  . UPPER GASTROINTESTINAL ENDOSCOPY  09/02/2020   Social History   Occupational History  . Not on file  Tobacco Use  . Smoking status: Former Smoker    Years: 10.00    Types: Cigarettes    Quit date: 1973    Years since quitting: 48.9  . Smokeless tobacco: Never Used  Vaping Use  . Vaping Use: Never used  Substance and Sexual Activity  . Alcohol use: Yes    Comment: occ beer  . Drug use: No  . Sexual activity: Yes

## 2020-09-15 NOTE — Telephone Encounter (Signed)
Spoke with patient. He states he has not reached out to the pharmacy to set up shipment. Patient was under the impression that his prescription went to the local pharmacy. Patient advised his prescription is sent to the [facility]. Patient provided phone number to pharmacy and will call to set up shipment. Patient advised to contact the pharmacy if he has any issues. Patient expressed understanding.

## 2020-09-15 NOTE — Telephone Encounter (Signed)
Patient's wife [name] called stating they haven't received a call from the specialty pharmacy to schedule delivery of Tommy Green's Enbrel medication.  [name] states [name] is taking his last injection today and requesting a return call.

## 2020-09-15 NOTE — Progress Notes (Signed)
Subjective: Patient is here for ultrasound-guided intra-articular left hip injection.   Pain in both hips, but the left hurts more than the right.  Objective: Pain and decreased range of motion with internal rotation.  Procedure: Ultrasound guided injection is preferred based studies that show increased duration, increased effect, greater accuracy, decreased procedural pain, increased response rate, and decreased cost with ultrasound guided versus blind injection.   Verbal informed consent obtained.  Time-out conducted.  Noted no overlying erythema, induration, or other signs of local infection. Ultrasound-guided left hip injection: After sterile prep with Betadine, injected 8 cc 1% lidocaine without epinephrine and 40 mg methylprednisolone using a 22-gauge spinal needle, passing the needle through the iliofemoral ligament into the femoral head/neck junction.  Injectate seen filling the joint capsule.  Good immediate relief.  We can inject the right hip later on if needed.

## 2020-09-16 ENCOUNTER — Encounter: Payer: Self-pay | Admitting: Gastroenterology

## 2020-09-16 NOTE — Telephone Encounter (Signed)
Spoke with [employer], she verified they have a prescription on file. She advised the patient will need to call and set up shipment. She states if they call today he will have his medication by Friday. Called and advised patient.

## 2020-09-17 ENCOUNTER — Other Ambulatory Visit: Payer: Self-pay | Admitting: Rheumatology

## 2020-09-17 ENCOUNTER — Ambulatory Visit
Admission: RE | Admit: 2020-09-17 | Discharge: 2020-09-17 | Disposition: A | Discharge status: Home / Self Care | Payer: Medicare HMO | Source: Ambulatory Visit | Attending: Nephrology | Admitting: Nephrology

## 2020-09-17 DIAGNOSIS — N189 Chronic kidney disease, unspecified: Secondary | ICD-10-CM | POA: Diagnosis not present

## 2020-09-17 DIAGNOSIS — M899 Disorder of bone, unspecified: Secondary | ICD-10-CM

## 2020-09-17 DIAGNOSIS — M0579 Rheumatoid arthritis with rheumatoid factor of multiple sites without organ or systems involvement: Secondary | ICD-10-CM

## 2020-09-17 DIAGNOSIS — N1832 Chronic kidney disease, stage 3b: Secondary | ICD-10-CM

## 2020-09-17 DIAGNOSIS — R69 Illness, unspecified: Secondary | ICD-10-CM | POA: Diagnosis not present

## 2020-09-17 IMAGING — US US RENAL
1 series · 14 of 25 positions shown · non-contrast
Comparison: Ultrasound [DATE].

CLINICAL DATA: Chronic renal disease.

EXAM:
RENAL / URINARY TRACT ULTRASOUND COMPLETE

[Series 1: us renal · 0.23mm/px · 14 of 34 slices shown]
[im 1/34]
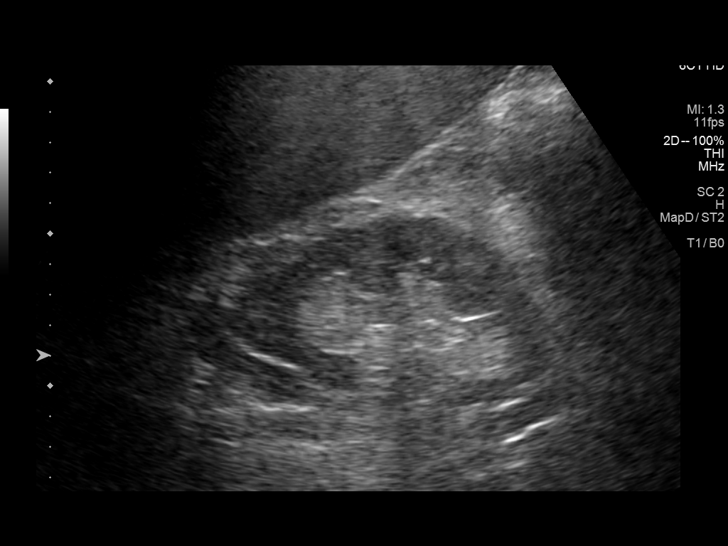
[im 3/34]
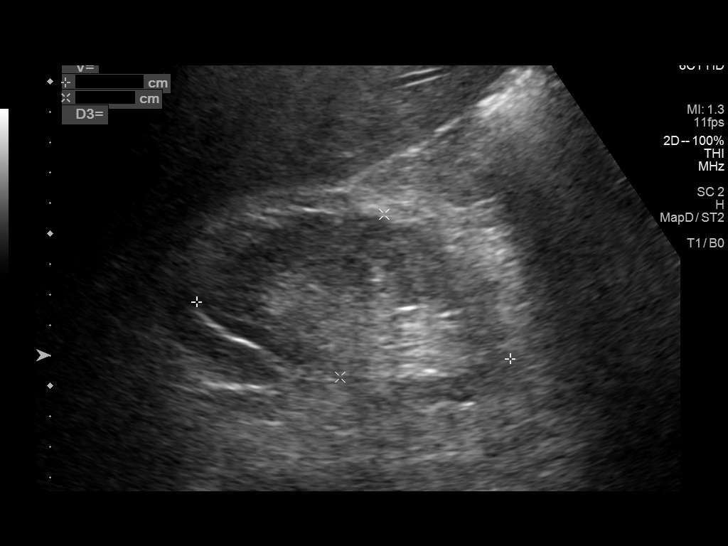
[im 6/34]
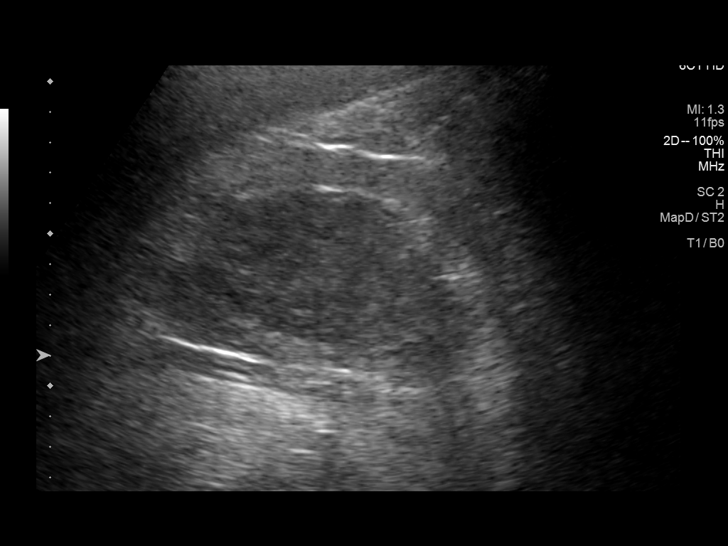
[im 9/34]
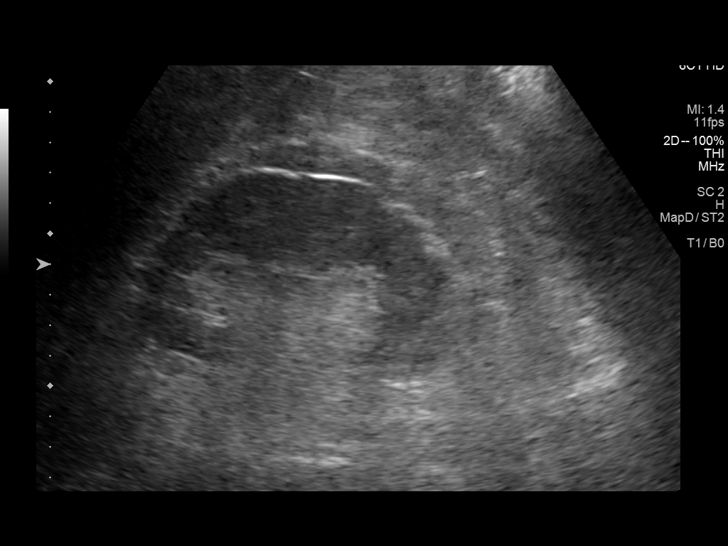
[im 12/34]
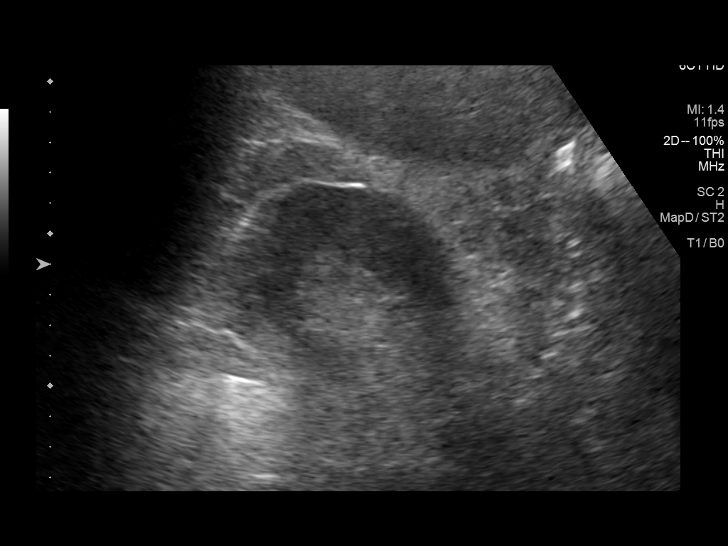
[im 13/34]
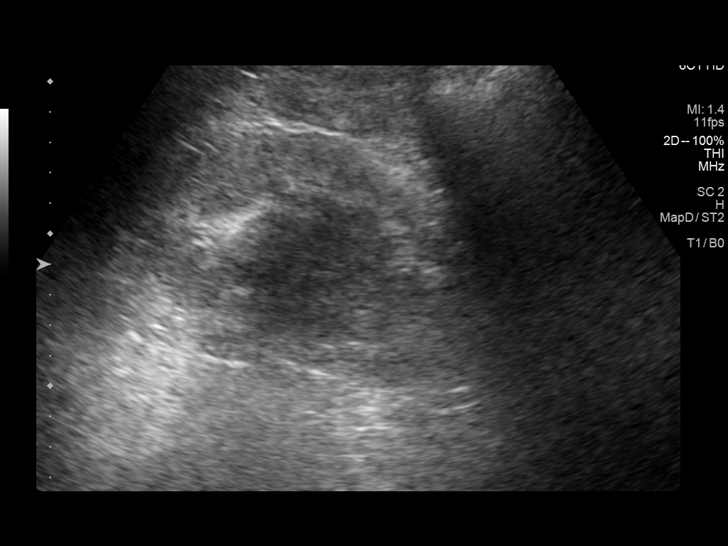
[im 16/34]
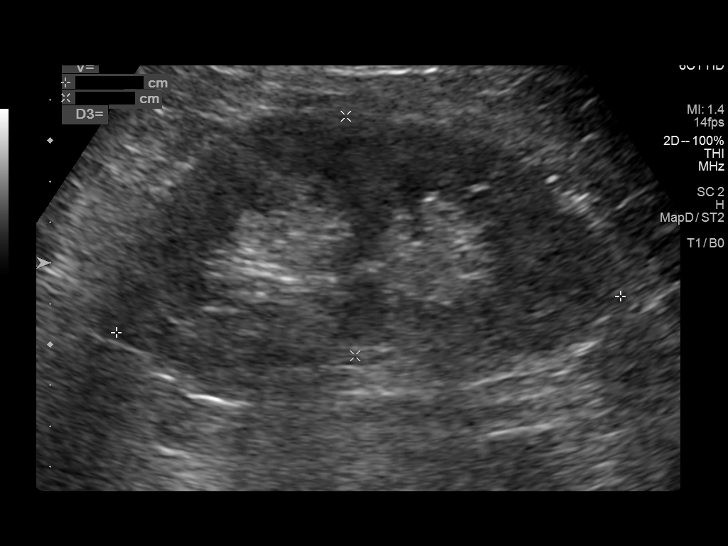
[im 18/34]
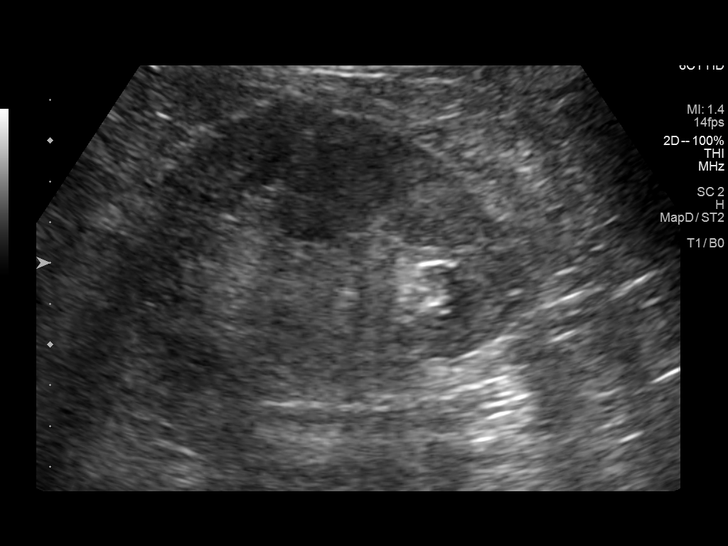
[im 21/34]
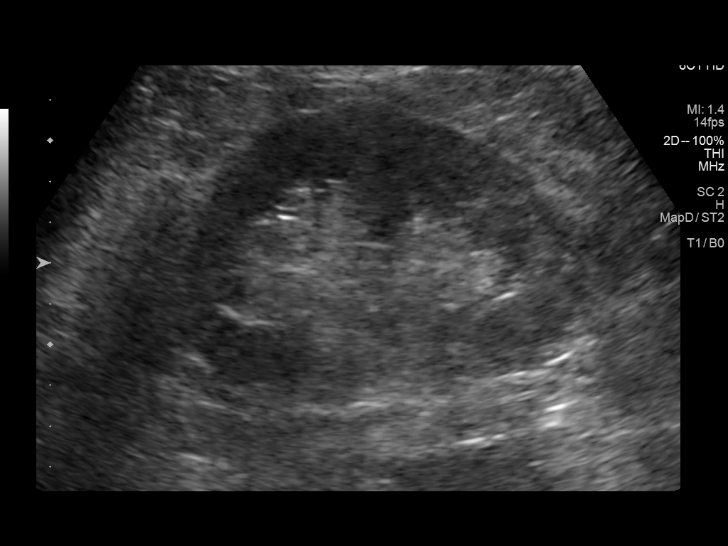
[im 23/34]
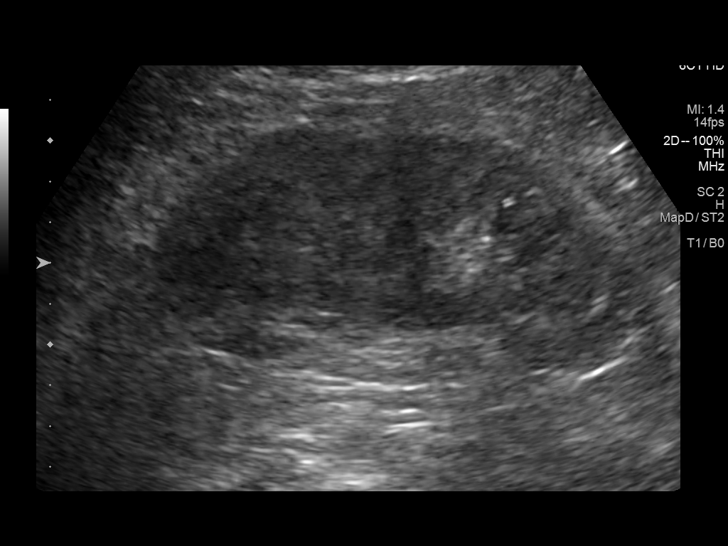
[im 25/34]
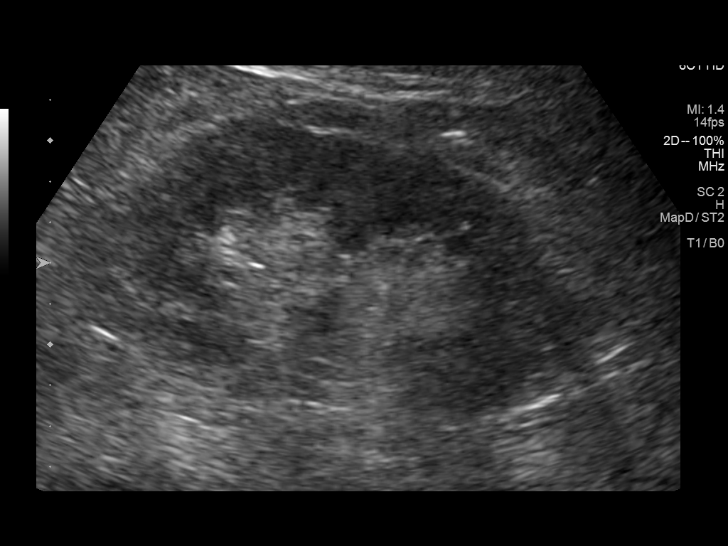
[im 28/34]
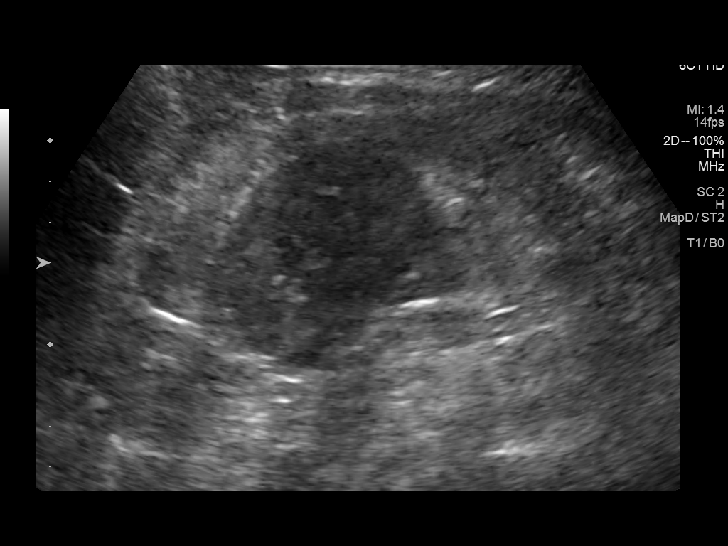
[im 31/34]
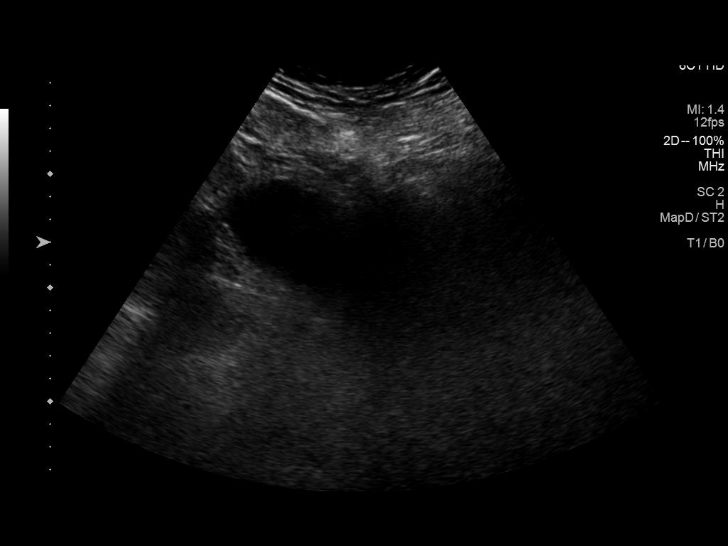
[im 34/34]
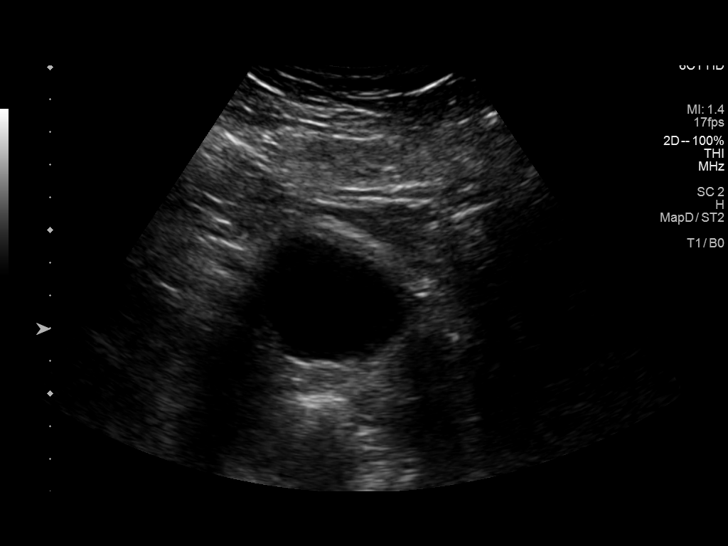

[14 of 25 positions shown; findings below may reference images not displayed]

FINDINGS: Right Kidney:

Renal measurements: 10.5 x 5.5 x 5.8 cm = volume: 174 mL. Mild
increased echogenicity. No mass lesion. No hydronephrosis.

Left Kidney:

Renal measurements: 12.4 x 5.9 x 5.5 cm = volume: 209 mL. Mild
increased echogenicity. No mass lesion. No hydronephrosis.

Bladder:

Appears normal for degree of bladder distention.

Other:

None.
IMPRESSION: Mild increased echogenicity of both kidneys consistent with chronic
medical renal disease. No hydronephrosis.

## 2020-09-17 NOTE — Telephone Encounter (Signed)
Last Visit: 09/08/2020 Next Visit: 02/02/2021 Labs: 07/15/2020 RBC count, Hgb, and hct are low. Hgb has dropped from 9.3 to 8.8. Creatinine remains elevated and GFR is low-42  Current Dose per office note 09/08/2020: Arava 10 mg 1 tablet by mouth daily  DX:  Rheumatoid arthritis involving multiple sites with positive rheumatoid factor   Okay to refill Arava?

## 2020-09-18 ENCOUNTER — Ambulatory Visit: Payer: Self-pay | Admitting: *Deleted

## 2020-09-22 ENCOUNTER — Ambulatory Visit: Payer: Medicare HMO | Admitting: Physician Assistant

## 2020-09-22 DIAGNOSIS — R69 Illness, unspecified: Secondary | ICD-10-CM | POA: Diagnosis not present

## 2020-10-03 DIAGNOSIS — I10 Essential (primary) hypertension: Secondary | ICD-10-CM | POA: Diagnosis not present

## 2020-10-03 DIAGNOSIS — Z6836 Body mass index (BMI) 36.0-36.9, adult: Secondary | ICD-10-CM | POA: Diagnosis not present

## 2020-10-03 DIAGNOSIS — S065X9S Traumatic subdural hemorrhage with loss of consciousness of unspecified duration, sequela: Secondary | ICD-10-CM | POA: Diagnosis not present

## 2020-10-03 DIAGNOSIS — S065X9A Traumatic subdural hemorrhage with loss of consciousness of unspecified duration, initial encounter: Secondary | ICD-10-CM | POA: Insufficient documentation

## 2020-10-14 ENCOUNTER — Ambulatory Visit: Payer: Medicare HMO

## 2020-10-15 NOTE — Progress Notes (Deleted)
Subjective:   Tommy Green is a 74 y.o. male who presents for Medicare Annual/Subsequent preventive examination.  Review of Systems    ***       Objective:    There were no vitals filed for this visit. There is no height or weight on file to calculate BMI.  Advanced Directives 05/16/2020 05/16/2020 03/30/2020 03/09/2020 01/14/2020 10/25/2019 10/25/2019  Does Patient Have a Medical Advance Directive? No No No No No No No  Type of Advance Directive - - - - - - -  Copy of Healthcare Power of Attorney in Chart? - - - - - - -  Would patient like information on creating a medical advance directive? - No - Patient declined No - Patient declined No - Guardian declined No - Patient declined No - Patient declined Yes (Inpatient - patient requests chaplain consult to create a medical advance directive)    Current Medications (verified) Outpatient Encounter Medications as of 10/16/2020  Medication Sig  . b complex vitamins capsule Take 1 capsule by mouth daily.  . carvedilol (COREG) 12.5 MG tablet TAKE 1 TABLET TWICE DAILY  WITH MEALS.  . cetirizine (ZYRTEC) 10 MG tablet Take 1 tablet (10 mg total) by mouth daily.  . colchicine 0.6 MG tablet Take 1 tablet (0.6 mg total) by mouth daily as needed.  . Etanercept (ENBREL MINI) 50 MG/ML SOCT Inject 50 mg into the skin once a week.  . fenofibrate micronized (LOFIBRA) 200 MG capsule TAKE 1 CAPSULE DAILY BEFOREBREAKFAST  . gabapentin (NEURONTIN) 600 MG tablet TAKE 1 TABLET 3 TIMES A DAY  . hydrochlorothiazide (HYDRODIURIL) 25 MG tablet TAKE 1 TABLET DAILY  . insulin aspart (NOVOLOG) 100 UNIT/ML injection Inject 0-20 Units into the skin 4 (four) times daily -  before meals and at bedtime.  . insulin degludec (TRESIBA) 100 UNIT/ML SOPN FlexTouch Pen Inject 0.6 mLs (60 Units total) into the skin at bedtime.  Marland Kitchen leflunomide (ARAVA) 10 MG tablet TAKE 1 TABLET BY MOUTH EVERY DAY  . lisinopril (ZESTRIL) 40 MG tablet TAKE 1 TABLET DAILY (Patient taking  differently: Take 40 mg by mouth daily. )  . lovastatin (MEVACOR) 10 MG tablet Take 1 tablet (10 mg total) by mouth at bedtime.  . Magnesium 200 MG TABS Take by mouth daily.  . metFORMIN (GLUCOPHAGE) 500 MG tablet Take 500 mg by mouth 2 (two) times daily with a meal.  . Multiple Vitamins-Minerals (MULTIVITAMIN WITH MINERALS) tablet Take 1 tablet by mouth daily.  Marland Kitchen omeprazole (PRILOSEC) 20 MG capsule Take 20 mg by mouth at bedtime.  Letta Pate VERIO test strip 4 (four) times daily.  . Turmeric 500 MG CAPS Take by mouth daily.   No facility-administered encounter medications on file as of 10/16/2020.    Allergies (verified) Influenza vaccines, Atorvastatin, Canagliflozin, and Statins   History: Past Medical History:  Diagnosis Date  . Anemia   . Essential hypertension 07/18/2017  . History of colonic polyps   . History of rheumatic fever   . History of stroke 07/18/2017  . Hypertriglyceridemia 07/18/2017  . Type 2 diabetes mellitus with diabetic neuropathy, unspecified (HCC) 07/18/2017   Past Surgical History:  Procedure Laterality Date  . COLONOSCOPY     around 2018 [facility]  . COLONOSCOPY  09/02/2020  . CRANIOTOMY Left 10/25/2019   Procedure: CRANIOTOMY HEMATOMA EVACUATION SUBDURAL;  Surgeon: Bedelia Person, MD;  Location: [facility];  Service: Neurosurgery;  Laterality: Left;  . ESOPHAGOGASTRODUODENOSCOPY     around 2018 with  [facility]  . FOOT CAPSULOTOMY Left 07/25/2008   Mid Foot #2 MPJ  . Hammertoe Repair Left 07/25/2008   #2 toe  . SPINAL FUSION    . TARSAL TUNNEL RELEASE Left 07/25/2008  . UPPER GASTROINTESTINAL ENDOSCOPY  09/02/2020   Family History  Problem Relation Age of Onset  . Hyperlipidemia Mother   . Colon polyps Mother   . Hyperlipidemia Father   . Alcoholism Brother   . Healthy Son   . Healthy Son   . Cancer Neg Hx   . Colon cancer Neg Hx   . Esophageal cancer Neg Hx   . Rectal cancer Neg Hx   . Stomach cancer Neg Hx    Social History    Socioeconomic History  . Marital status: Married    Spouse name: Not on file  . Number of children: Not on file  . Years of education: Not on file  . Highest education level: Not on file  Occupational History  . Not on file  Tobacco Use  . Smoking status: Former Smoker    Years: 10.00    Types: Cigarettes    Quit date: 1973    Years since quitting: 49.0  . Smokeless tobacco: Never Used  Vaping Use  . Vaping Use: Never used  Substance and Sexual Activity  . Alcohol use: Yes    Comment: occ beer  . Drug use: No  . Sexual activity: Yes  Other Topics Concern  . Not on file  Social History Narrative  . Not on file   Social Determinants of Health   Financial Resource Strain: Not on file  Food Insecurity: Not on file  Transportation Needs: Not on file  Physical Activity: Not on file  Stress: Not on file  Social Connections: Not on file    Tobacco Counseling Counseling given: Not Answered   Clinical Intake:                 Diabetes:  Is the patient diabetic?  Yes  If diabetic, was a CBG obtained today?  No  Did the patient bring in their glucometer from home?  No  How often do you monitor your CBG's? ***.   Financial Strains and Diabetes Management:  Are you having any financial strains with the device, your supplies or your medication? {YES/NO:21197}.  Does the patient want to be seen by Chronic Care Management for management of their diabetes?  {YES/NO:21197} Would the patient like to be referred to a Nutritionist or for Diabetic Management?  {YES/NO:21197}  Diabetic Exams:  Diabetic Eye Exam: Completed ***. Overdue for diabetic eye exam. Pt has been advised about the importance in completing this exam. A referral has been placed today. Message sent to referral coordinator for scheduling purposes. Advised pt to expect a call from our office re: appt.  Diabetic Foot Exam: Completed ***. Pt has been advised about the importance in completing this  exam. Pt is scheduled for diabetic foot exam on ***.            Activities of Daily Living In your present state of health, do you have any difficulty performing the following activities: 11/01/2019 10/25/2019  Hearing? - N  Vision? - N  Difficulty concentrating or making decisions? - N  Walking or climbing stairs? - Y  Dressing or bathing? - N  Doing errands, shopping? N -  Some recent data might be hidden    Patient Care Team: [name], DO as PCP - General (Family  Medicine) Alanson Aly, MD as Referring Physician (Internal Medicine) Jimmie Molly, OD (Optometry)  Indicate any recent Medical Services you may have received from other than [facility] providers in the past year (date may be approximate).     Assessment:   This is a routine wellness examination for [place].  Hearing/Vision screen No exam data present  Dietary issues and exercise activities discussed:    Goals    . maintain normal A1C      Depression Screen PHQ 2/9 Scores 09/18/2019 01/05/2018  PHQ - 2 Score 0 0    Fall Risk Fall Risk  09/18/2019 01/05/2018  Falls in the past year? 1 No  Number falls in past yr: 0 -  Injury with Fall? 0 -  Follow up Education provided;Falls prevention discussed -    FALL RISK PREVENTION PERTAINING TO THE HOME:  Any stairs in or around the home? {YES/NO:21197} If so, are there any without handrails? {YES/NO:21197} Home free of loose throw rugs in walkways, pet beds, electrical cords, etc? {YES/NO:21197} Adequate lighting in your home to reduce risk of falls? {YES/NO:21197}  ASSISTIVE DEVICES UTILIZED TO PREVENT FALLS:  Life alert? {YES/NO:21197} Use of a cane, walker or w/c? {YES/NO:21197} Grab bars in the bathroom? {YES/NO:21197} Shower chair or bench in shower? {YES/NO:21197} Elevated toilet seat or a handicapped toilet? {YES/NO:21197}  TIMED UP AND GO:  Was the test performed? {YES/NO:21197}.  Length of time to ambulate 10 feet: *** sec.    {Appearance of Gait:2101803}  Cognitive Function: MMSE - Mini Mental State Exam 01/05/2018  Orientation to time 5  Orientation to Place 5  Registration 3  Attention/ Calculation 2  Recall 2  Language- name 2 objects 2  Language- repeat 1  Language- follow 3 step command 3  Language- read & follow direction 1  Write a sentence 1  Copy design 1  Total score 26        Immunizations Immunization History  Administered Date(s) Administered  . Influenza-Unspecified 07/31/2013, 04/17/2014  . Moderna Sars-Covid-2 Vaccination 12/11/2019, 01/10/2020, 08/08/2020  . Pneumococcal Conjugate-13 08/07/2015  . Pneumococcal Polysaccharide-23 04/17/2013, 07/18/2013  . Td 12/25/2018  . Zoster 04/17/2013    TDAP status: Up to date  {Flu Vaccine status:2101806}  Pneumococcal vaccine status: Up to date  Covid-19 vaccine status: Completed vaccines  Qualifies for Shingles Vaccine? Yes   Zostavax completed Yes   Shingrix Completed?: No.    Education has been provided regarding the importance of this vaccine. Patient has been advised to call insurance company to determine out of pocket expense if they have not yet received this vaccine. Advised may also receive vaccine at local pharmacy or Health Dept. Verbalized acceptance and understanding.  Screening Tests Health Maintenance  Topic Date Due  . INFLUENZA VACCINE  05/18/2020  . COVID-19 Vaccine (4 - Booster for Moderna series) 02/06/2021  . COLONOSCOPY (Pts 45-67yrs Insurance coverage will need to be confirmed)  09/03/2027  . TETANUS/TDAP  12/24/2028  . Hepatitis C Screening  Completed  . PNA vac Low Risk Adult  Completed  . FOOT EXAM  Discontinued  . HEMOGLOBIN A1C  Discontinued  . OPHTHALMOLOGY EXAM  Discontinued    Health Maintenance  Health Maintenance Due  Topic Date Due  . INFLUENZA VACCINE  05/18/2020    Colorectal cancer screening: No longer required.   Lung Cancer Screening: (Low Dose CT Chest recommended if Age  62-80 years, 30 pack-year currently smoking OR have quit w/in 15years.) does not qualify.     Additional Screening:  Hepatitis C Screening: Completed 04/17/2020  Vision Screening: Recommended annual ophthalmology exams for early detection of glaucoma and other disorders of the eye. Is the patient up to date with their annual eye exam?  {YES/NO:21197} Who is the provider or what is the name of the office in which the patient attends annual eye exams? *** If pt is not established with a provider, would they like to be referred to a provider to establish care? {YES/NO:21197}.   Dental Screening: Recommended annual dental exams for proper oral hygiene  Community Resource Referral / Chronic Care Management: CRR required this visit?  {YES/NO:21197}  CCM required this visit?  {YES/NO:21197}     Plan:     I have personally reviewed and noted the following in the patient's chart:   . Medical and social history . Use of alcohol, tobacco or illicit drugs  . Current medications and supplements . Functional ability and status . Nutritional status . Physical activity . Advanced directives . List of other physicians . Hospitalizations, surgeries, and ER visits in previous 12 months . Vitals . Screenings to include cognitive, depression, and falls . Referrals and appointments  In addition, I have reviewed and discussed with patient certain preventive protocols, quality metrics, and best practice recommendations. A written personalized care plan for preventive services as well as general preventive health recommendations were provided to patient.     [name], LPN   86/76/1952  Nurse Health Advisor  Nurse Notes: ***

## 2020-10-16 ENCOUNTER — Ambulatory Visit: Payer: Self-pay

## 2020-10-23 ENCOUNTER — Telehealth: Payer: Self-pay | Admitting: Pharmacist

## 2020-10-23 DIAGNOSIS — M0579 Rheumatoid arthritis with rheumatoid factor of multiple sites without organ or systems involvement: Secondary | ICD-10-CM

## 2020-10-23 NOTE — Telephone Encounter (Signed)
Submitted complete Patient Assistance Application to [employer] for ENBREL along with provider portion, PA and income documents. Will update patient when we receive a response.  Fax# [phone] Phone# [phone]

## 2020-10-25 ENCOUNTER — Other Ambulatory Visit: Payer: Self-pay | Admitting: Physician Assistant

## 2020-10-25 DIAGNOSIS — M0579 Rheumatoid arthritis with rheumatoid factor of multiple sites without organ or systems involvement: Secondary | ICD-10-CM

## 2020-10-27 NOTE — Telephone Encounter (Signed)
Patient is due to update lab work.  Ok to send in 30 day supply only.    Please also clarify if he has undergone a workup for his worsening anemia revealed by his lab work on 07/15/20.

## 2020-10-27 NOTE — Telephone Encounter (Signed)
Last Visit: 09/08/2020 Next Visit: 02/02/2021 Labs: 07/15/2020, RBC count, Hgb, and hct are low. Hgb has dropped from 9.3 to 8.8. Please notify the patient. He has a history of chronic anemia likely secondary to chronic kidney disease. He will require further workup to determine why his hgb continues to trend down. Creatinine remains elevated and GFR is low-42. Please advise the patient to avoid taking NSAIDs.   Current Dose per office note 09/08/2020, Arava 10 mg 1 tablet by mouth daily DX: Rheumatoid arthritis involving multiple sites with positive rheumatoid factor   Okay to refill ARAVA?

## 2020-10-29 DIAGNOSIS — M545 Low back pain, unspecified: Secondary | ICD-10-CM | POA: Diagnosis not present

## 2020-10-29 DIAGNOSIS — M47896 Other spondylosis, lumbar region: Secondary | ICD-10-CM | POA: Diagnosis not present

## 2020-10-29 DIAGNOSIS — M48062 Spinal stenosis, lumbar region with neurogenic claudication: Secondary | ICD-10-CM | POA: Diagnosis not present

## 2020-10-29 DIAGNOSIS — M4326 Fusion of spine, lumbar region: Secondary | ICD-10-CM | POA: Diagnosis not present

## 2020-11-05 ENCOUNTER — Other Ambulatory Visit: Payer: Self-pay | Admitting: Family Medicine

## 2020-11-05 MED ORDER — LISINOPRIL 40 MG PO TABS: 40.0000 mg | ORAL_TABLET | Freq: Every day | ORAL | 3 refills | Status: DC

## 2020-11-10 DIAGNOSIS — M48061 Spinal stenosis, lumbar region without neurogenic claudication: Secondary | ICD-10-CM | POA: Diagnosis not present

## 2020-11-10 DIAGNOSIS — Z981 Arthrodesis status: Secondary | ICD-10-CM | POA: Diagnosis not present

## 2020-11-10 DIAGNOSIS — M545 Low back pain, unspecified: Secondary | ICD-10-CM | POA: Diagnosis not present

## 2020-11-10 DIAGNOSIS — M5136 Other intervertebral disc degeneration, lumbar region: Secondary | ICD-10-CM | POA: Diagnosis not present

## 2020-11-10 NOTE — Telephone Encounter (Signed)
Refaxed PAP application to [employer] for Enbrel. Completed Section 3 and dated pages 2 and 3.

## 2020-11-13 DIAGNOSIS — L218 Other seborrheic dermatitis: Secondary | ICD-10-CM | POA: Diagnosis not present

## 2020-11-18 ENCOUNTER — Ambulatory Visit (INDEPENDENT_AMBULATORY_CARE_PROVIDER_SITE_OTHER): Payer: Medicare HMO | Admitting: Family Medicine

## 2020-11-18 ENCOUNTER — Other Ambulatory Visit: Payer: Self-pay

## 2020-11-18 ENCOUNTER — Encounter: Payer: Self-pay | Admitting: Family Medicine

## 2020-11-18 DIAGNOSIS — M25522 Pain in left elbow: Secondary | ICD-10-CM

## 2020-11-18 NOTE — Progress Notes (Signed)
Office Visit Note   Patient: Tommy Green           Date of Birth: October 11, 1946           MRN: [id number] Visit Date: 11/18/2020 Requested by: [name], [address] [place],  [place] [address] PCP: [name], DO  Subjective: Chief Complaint  Patient presents with  . Left Elbow - Pain    Fluid on the elbow x  5 days. No redness/warmth, but is sore.    HPI: He is here with left elbow swelling.  First noticed it 5 days ago, his neighbor pointed it out to him.  Now it bothers him whenever he rests his arm on something.  We aspirated and injected his right elbow olecranon bursa in September and he has not had problems with it since.  Denies any fevers or chills.  He now has the diagnosis of rheumatoid arthritis.  He is also getting ready to have back surgery.              ROS:   All other systems were reviewed and are negative.  Objective: Vital Signs: There were no vitals taken for this visit.  Physical Exam:  General:  Alert and oriented, in no acute distress. Pulm:  Breathing unlabored. Psy:  Normal mood, congruent affect. Skin: No erythema or warmth Left elbow: Full flexion and extension, full pronation and supination of the forearm.  He has 2+ swelling of the olecranon bursa, slight tenderness to palpation.   Imaging: No results found.  Assessment & Plan: 1.  Left elbow aseptic olecranon bursitis -Discussed options with him and he wants to have it aspirated and injected.  He will follow-up as needed.     Procedures: Left elbow aspiration: After sterile prep with Betadine, injected 3 cc 0.25% bupivacaine then aspirated 15 cc blood-tinged synovial fluid, then injected 3 mg betamethasone.  Applied an Ace bandage for compression.       PMFS History: Patient Active Problem List   Diagnosis Date Noted  . Sore throat 12/14/2019  . Acute kidney injury superimposed on chronic kidney disease ([place])   . Respiratory failure  ([place])   . Acute encephalopathy   . Hypernatremia   . Dyspnea   . Acute respiratory failure with hypoxemia ([place])   . SDH (subdural hematoma) ([place]) 10/24/2019  . Chronic pain of right knee 10/03/2019  . Cyclic vomiting syndrome 08/06/2019  . Neuropathy 12/25/2018  . Bilateral hip pain 08/24/2018  . AK (actinic keratosis) 08/24/2018  . Neoplasm of uncertain behavior 19/14/7829  . 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 Tendinopathy of right gluteus medius 07/13/2018  . Tendinopathy of left gluteus medius 07/13/2018  . Squamous cell carcinoma in situ (SCCIS) of skin 03/24/2018  . Skin lesion 03/17/2018  . Vitamin D deficiency 11/29/2017  . Stage 3 chronic kidney disease ([place]) 10/28/2017  . Primary osteoarthritis of right hip 09/11/2017  . Essential hypertension 07/18/2017  . Hyperlipidemia associated with type 2 diabetes mellitus ([place]) 07/18/2017  . Type 2 diabetes mellitus with diabetic neuropathy, unspecified ([place]) 07/18/2017  . Heart murmur 07/18/2017  . Hypertriglyceridemia 07/18/2017  . History of stroke 07/18/2017  . DDD (degenerative disc disease), lumbar 06/15/2017  . Iron deficiency anemia 06/15/2017  . Stroke ([place]) 06/15/2017  . Encounter for hepatitis C screening test for low risk patient 06/30/2016  . Anemia 06/19/2016  . Microalbuminuria due to type 2 diabetes mellitus ([place]) 03/19/2016  . Hypertrophy of inferior nasal  turbinate 12/20/2015  . Nasal congestion 12/15/2015  . Disease of nasal cavity and sinuses 12/10/2015  . Degenerative tear of acetabular labrum of left hip 11/04/2015  . Cellulitis and abscess of left leg 09/25/2015  . History of tobacco abuse 05/15/2015  . Morbid (severe) obesity due to excess calories ([place]) 04/11/2015  . Disorder of both eustachian tubes 01/27/2015  . Maxillary sinusitis 01/13/2015  . Lipoprotein deficiency 01/31/2014  . Gastro-esophageal reflux disease without esophagitis 11/02/2013  . Rheumatic fever 11/02/2013  . Erectile dysfunction  associated with type 2 diabetes mellitus ([place]) 07/31/2013  . Pain in joint, ankle and foot 02/06/2013  . Type 2 diabetes mellitus with hyperglycemia, without long-term current use of insulin ([place]) 11/16/2012   Past Medical History:  Diagnosis Date  . Anemia   . Essential hypertension 07/18/2017  . History of colonic polyps   . History of rheumatic fever   . History of stroke 07/18/2017  . Hypertriglyceridemia 07/18/2017  . Type 2 diabetes mellitus with diabetic neuropathy, unspecified ([place]) 07/18/2017    Family History  Problem Relation Age of Onset  . Hyperlipidemia Mother   . Colon polyps Mother   . Hyperlipidemia Father   . Alcoholism Brother   . Healthy Son   . Healthy Son   . Cancer Neg Hx   . Colon cancer Neg Hx   . Esophageal cancer Neg Hx   . Rectal cancer Neg Hx   . Stomach cancer Neg Hx     Past Surgical History:  Procedure Laterality Date  . COLONOSCOPY     around 2018 [facility]  . COLONOSCOPY  09/02/2020  . CRANIOTOMY Left 10/25/2019   Procedure: CRANIOTOMY HEMATOMA EVACUATION SUBDURAL;  Surgeon: Vallarie Mare, MD;  Location: [facility];  Service: Neurosurgery;  Laterality: Left;  . ESOPHAGOGASTRODUODENOSCOPY     around 2018 with [facility]  . FOOT CAPSULOTOMY Left 07/25/2008   Mid Foot #2 MPJ  . Hammertoe Repair Left 07/25/2008   #2 toe  . SPINAL FUSION    . TARSAL TUNNEL RELEASE Left 07/25/2008  . UPPER GASTROINTESTINAL ENDOSCOPY  09/02/2020   Social History   Occupational History  . Not on file  Tobacco Use  . Smoking status: Former Smoker    Years: 10.00    Types: Cigarettes    Quit date: 1973    Years since quitting: 49.1  . Smokeless tobacco: Never Used  Vaping Use  . Vaping Use: Never used  Substance and Sexual Activity  . Alcohol use: Yes    Comment: occ beer  . Drug use: No  . Sexual activity: Yes

## 2020-11-20 ENCOUNTER — Telehealth: Payer: Self-pay | Admitting: Family Medicine

## 2020-11-20 DIAGNOSIS — M4326 Fusion of spine, lumbar region: Secondary | ICD-10-CM | POA: Diagnosis not present

## 2020-11-20 DIAGNOSIS — M4010 Other secondary kyphosis, site unspecified: Secondary | ICD-10-CM | POA: Diagnosis not present

## 2020-11-20 DIAGNOSIS — M48062 Spinal stenosis, lumbar region with neurogenic claudication: Secondary | ICD-10-CM | POA: Diagnosis not present

## 2020-11-20 DIAGNOSIS — M4716 Other spondylosis with myelopathy, lumbar region: Secondary | ICD-10-CM | POA: Diagnosis not present

## 2020-11-20 DIAGNOSIS — M4150 Other secondary scoliosis, site unspecified: Secondary | ICD-10-CM | POA: Diagnosis not present

## 2020-11-20 NOTE — Telephone Encounter (Signed)
Patient wife dropped off form to be signed  Would like for it to be faxed to [phone]   Paperwork put into [facility] folder.

## 2020-11-21 NOTE — Telephone Encounter (Signed)
In PCP's folder.

## 2020-11-25 ENCOUNTER — Encounter: Payer: Self-pay | Admitting: Family Medicine

## 2020-11-25 ENCOUNTER — Ambulatory Visit: Payer: Medicare HMO | Admitting: Family Medicine

## 2020-11-25 ENCOUNTER — Other Ambulatory Visit: Payer: Self-pay

## 2020-11-25 ENCOUNTER — Ambulatory Visit (INDEPENDENT_AMBULATORY_CARE_PROVIDER_SITE_OTHER): Payer: Medicare HMO | Admitting: Family Medicine

## 2020-11-25 VITALS — BP 152/80 | HR 85 | Temp 97.8°F | Ht 71.0 in | Wt 248.2 lb

## 2020-11-25 DIAGNOSIS — Z7984 Long term (current) use of oral hypoglycemic drugs: Secondary | ICD-10-CM | POA: Diagnosis not present

## 2020-11-25 DIAGNOSIS — E1142 Type 2 diabetes mellitus with diabetic polyneuropathy: Secondary | ICD-10-CM | POA: Diagnosis not present

## 2020-11-25 DIAGNOSIS — Z6834 Body mass index (BMI) 34.0-34.9, adult: Secondary | ICD-10-CM | POA: Diagnosis not present

## 2020-11-25 DIAGNOSIS — Z01818 Encounter for other preprocedural examination: Secondary | ICD-10-CM | POA: Diagnosis not present

## 2020-11-25 DIAGNOSIS — K219 Gastro-esophageal reflux disease without esophagitis: Secondary | ICD-10-CM | POA: Diagnosis not present

## 2020-11-25 DIAGNOSIS — I10 Essential (primary) hypertension: Secondary | ICD-10-CM | POA: Diagnosis not present

## 2020-11-25 DIAGNOSIS — G8929 Other chronic pain: Secondary | ICD-10-CM | POA: Diagnosis not present

## 2020-11-25 DIAGNOSIS — E1151 Type 2 diabetes mellitus with diabetic peripheral angiopathy without gangrene: Secondary | ICD-10-CM | POA: Diagnosis not present

## 2020-11-25 DIAGNOSIS — E785 Hyperlipidemia, unspecified: Secondary | ICD-10-CM | POA: Diagnosis not present

## 2020-11-25 DIAGNOSIS — M0550 Rheumatoid polyneuropathy with rheumatoid arthritis of unspecified site: Secondary | ICD-10-CM | POA: Diagnosis not present

## 2020-11-25 DIAGNOSIS — E669 Obesity, unspecified: Secondary | ICD-10-CM | POA: Diagnosis not present

## 2020-11-25 NOTE — Patient Instructions (Addendum)
Give [place] 2-3 business days to get the results of your labs back.   I think you are optimized for the procedure, but we will wait on the results.  No need to hold any medications for the surgery.   Let us know if you need anything.

## 2020-11-25 NOTE — Progress Notes (Signed)
Subjective:   Chief Complaint  Patient presents with  . Medical Clearance    Tommy Green  is here for a Pre-operative physical at the request of Dr. Patrice Paradise.   He is having TIO-pelvis, lami/PSF, TLIF, date pending this evaluation.   Personal or family hx of adverse outcome to anesthesia? No  Chipped, cracked, missing, or loose teeth? No  Decreased ROM of neck? No  Able to walk up 2 flights of stairs without becoming significantly short of breath or having chest pain? Yes   Revised Goldman Criteria: High Risk Surgery (intraperitoneal, intrathoracic, aortic): No  Ischemic heart disease (Prior MI, +excercise stress test, angina, nitrate use, Qwave): No  History of heart failure: No  History of cerebrovascular disease: no  History of diabetes: Yes  Insulin therapy for DM: No  Preoperative Cr >2.0: No    Patient Active Problem List   Diagnosis Date Noted  . Sore throat 12/14/2019  . Acute kidney injury superimposed on chronic kidney disease ([place])   . Respiratory failure ([place])   . Acute encephalopathy   . Hypernatremia   . Dyspnea   . Acute respiratory failure with hypoxemia ([place])   . SDH (subdural hematoma) ([place]) 10/24/2019  . Chronic pain of right knee 10/03/2019  . Cyclic vomiting syndrome 08/06/2019  . Neuropathy 12/25/2018  . Bilateral hip pain 08/24/2018  . AK (actinic keratosis) 08/24/2018  . Neoplasm of uncertain behavior 16/07/9603  . 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 Tendinopathy of right gluteus medius 07/13/2018  . Tendinopathy of left gluteus medius 07/13/2018  . Squamous cell carcinoma in situ (SCCIS) of skin 03/24/2018  . Skin lesion 03/17/2018  . Vitamin D deficiency 11/29/2017  . Stage 3 chronic kidney disease ([place]) 10/28/2017  . Primary osteoarthritis of right hip 09/11/2017  . Essential hypertension 07/18/2017  . Hyperlipidemia associated with type 2 diabetes mellitus ([place]) 07/18/2017  . Type 2 diabetes mellitus with diabetic neuropathy, unspecified ([place])  07/18/2017  . Heart murmur 07/18/2017  . Hypertriglyceridemia 07/18/2017  . History of stroke 07/18/2017  . DDD (degenerative disc disease), lumbar 06/15/2017  . Iron deficiency anemia 06/15/2017  . Stroke ([place]) 06/15/2017  . Encounter for hepatitis C screening test for low risk patient 06/30/2016  . Anemia 06/19/2016  . Microalbuminuria due to type 2 diabetes mellitus ([place]) 03/19/2016  . Hypertrophy of inferior nasal turbinate 12/20/2015  . Nasal congestion 12/15/2015  . Disease of nasal cavity and sinuses 12/10/2015  . Degenerative tear of acetabular labrum of left hip 11/04/2015  . Cellulitis and abscess of left leg 09/25/2015  . History of tobacco abuse 05/15/2015  . Morbid (severe) obesity due to excess calories ([place]) 04/11/2015  . Disorder of both eustachian tubes 01/27/2015  . Maxillary sinusitis 01/13/2015  . Lipoprotein deficiency 01/31/2014  . Gastro-esophageal reflux disease without esophagitis 11/02/2013  . Rheumatic fever 11/02/2013  . Erectile dysfunction associated with type 2 diabetes mellitus ([place]) 07/31/2013  . Pain in joint, ankle and foot 02/06/2013  . Type 2 diabetes mellitus with hyperglycemia, without long-term current use of insulin ([place]) 11/16/2012   Past Medical History:  Diagnosis Date  . Anemia   . Essential hypertension 07/18/2017  . History of colonic polyps   . History of rheumatic fever   . History of stroke 07/18/2017  . Hypertriglyceridemia 07/18/2017  . Type 2 diabetes mellitus with diabetic neuropathy, unspecified ([place]) 07/18/2017    Past Surgical History:  Procedure Laterality Date  . COLONOSCOPY     around 2018 [facility]  . COLONOSCOPY  09/02/2020  . CRANIOTOMY Left 10/25/2019   Procedure: CRANIOTOMY HEMATOMA EVACUATION SUBDURAL;  Surgeon: Vallarie Mare, MD;  Location: [facility];  Service: Neurosurgery;  Laterality: Left;  . ESOPHAGOGASTRODUODENOSCOPY     around 2018 with [facility]  . FOOT CAPSULOTOMY Left 07/25/2008   Mid Foot #2  MPJ  . Hammertoe Repair Left 07/25/2008   #2 toe  . SPINAL FUSION    . TARSAL TUNNEL RELEASE Left 07/25/2008  . UPPER GASTROINTESTINAL ENDOSCOPY  09/02/2020    Current Outpatient Medications  Medication Sig Dispense Refill  . alclomethasone (ACLOVATE) 0.05 % cream Apply 1 application topically 2 (two) times daily.    Marland Kitchen b complex vitamins capsule Take 1 capsule by mouth daily.    . carvedilol (COREG) 12.5 MG tablet TAKE 1 TABLET TWICE DAILY  WITH MEALS. 60 tablet 1  . cetirizine (ZYRTEC) 10 MG tablet Take 1 tablet (10 mg total) by mouth daily. 30 tablet 3  . colchicine 0.6 MG tablet Take 1 tablet (0.6 mg total) by mouth daily as needed. 30 tablet 1  . Etanercept (ENBREL MINI) 50 MG/ML SOCT Inject 50 mg into the skin once a week. 12 mL 0  . fenofibrate micronized (LOFIBRA) 200 MG capsule TAKE 1 CAPSULE DAILY BEFOREBREAKFAST 90 capsule 3  . gabapentin (NEURONTIN) 600 MG tablet TAKE 1 TABLET 3 TIMES A DAY 270 tablet 0  . hydrochlorothiazide (HYDRODIURIL) 25 MG tablet TAKE 1 TABLET DAILY 90 tablet 3  . insulin aspart (NOVOLOG) 100 UNIT/ML injection Inject 0-20 Units into the skin 4 (four) times daily -  before meals and at bedtime.    . insulin degludec (TRESIBA) 100 UNIT/ML SOPN FlexTouch Pen Inject 0.6 mLs (60 Units total) into the skin at bedtime. 5 pen 2  . leflunomide (ARAVA) 10 MG tablet TAKE 1 TABLET BY MOUTH EVERY DAY 30 tablet 0  . lisinopril (ZESTRIL) 40 MG tablet Take 1 tablet (40 mg total) by mouth daily. 90 tablet 3  . lovastatin (MEVACOR) 10 MG tablet Take 1 tablet (10 mg total) by mouth at bedtime. 30 tablet 3  . Magnesium 200 MG TABS Take by mouth daily.    . metFORMIN (GLUCOPHAGE) 500 MG tablet Take 500 mg by mouth 2 (two) times daily with a meal.    . Multiple Vitamins-Minerals (MULTIVITAMIN WITH MINERALS) tablet Take 1 tablet by mouth daily.    Marland Kitchen omeprazole (PRILOSEC) 20 MG capsule Take 20 mg by mouth at bedtime.    Glory Rosebush VERIO test strip 4 (four) times daily.    .  Turmeric 500 MG CAPS Take by mouth daily.     Allergies  Allergen Reactions  . Influenza Vaccines Other (See Comments)  . Atorvastatin Other (See Comments)    Myalgias GI upset Unspecified    . Canagliflozin     Other reaction(s): Other (See Comments) Pt reports that the use of Invokana caused acute kidney failure  Unspecified    . Statins     Other reaction(s): Myalgias (intolerance)    Family History  Problem Relation Age of Onset  . Hyperlipidemia Mother   . Colon polyps Mother   . Hyperlipidemia Father   . Alcoholism Brother   . Healthy Son   . Healthy Son   . Cancer Neg Hx   . Colon cancer Neg Hx   . Esophageal cancer Neg Hx   . Rectal cancer Neg Hx   . Stomach cancer Neg Hx      Review of Systems:  Constitutional:  no fevers Eye:  no recent significant change in vision Ear:  no hearing loss Nose/Mouth/Throat:  No dental complaints Neck/Thyroid:  no lumps or masses Pulmonary:  No shortness of breath Cardiovascular:  no chest pain Gastrointestinal:  no abdominal pain GU:  negative for dysuria Musculoskeletal/Extremities:  +back pain Skin/Integumentary ROS:  no new abnormal skin lesions reported Neurologic:  no HA   Objective:   Vitals:   11/25/20 1537  BP: (!) 152/80  Pulse: 85  Temp: 97.8 F (36.6 C)  TempSrc: Oral  SpO2: 98%  Weight: 248 lb 4 oz (112.6 kg)  Height: 5\' 11"  (1.803 m)   Body mass index is 34.62 kg/m.  General:  well developed, well nourished, in no apparent distress Skin:  warm, no pallor or diaphoresis Head:  normocephalic, atraumatic Eyes:  pupils equal and round, sclera anicteric without injection Ears:  canals without lesions, TMs shiny without retraction, no obvious effusion, no erythema Throat/Pharynx:  lips and gingiva without lesion; tongue and uvula midline; non-inflamed pharynx; no exudates or postnasal drainage Neck: neck supple without adenopathy, thyromegaly, or masses, no bruits, no jugular venous distention   Lungs:  clear to auscultation, breath sounds equal bilaterally, no respiratory distress Cardio:  regular rate and rhythm without murmurs Musculoskeletal:  symmetrical muscle groups noted without atrophy or deformity Extremities:  no clubbing, cyanosis, or edema, no deformities, no skin discoloration Neuro:  gait antalgic gait; deep tendon reflexes normal and symmetric and alert and oriented to person, place, and time Psych: Age appropriate judgment and insight; normal mood  Assessment:   Preop examination - Plan: CBC, Basic metabolic panel   Plan:   Orders as above. The above laboratory work was ordered and will be sent with this physical. Pending the above lab workup, the patient is deemed low cardiac risk for the proposed procedure.  The patient voiced understanding and agreement to the plan.  [place], DO 11/25/20  3:55 PM

## 2020-11-25 NOTE — Telephone Encounter (Signed)
Called [employer], Patient's application is in final review. Awaiting response.

## 2020-11-26 ENCOUNTER — Other Ambulatory Visit: Payer: Self-pay | Admitting: Family Medicine

## 2020-11-26 DIAGNOSIS — Z01818 Encounter for other preprocedural examination: Secondary | ICD-10-CM

## 2020-11-26 DIAGNOSIS — N289 Disorder of kidney and ureter, unspecified: Secondary | ICD-10-CM

## 2020-11-26 LAB — CBC
HCT: 33.3 % — ABNORMAL LOW (ref 39.0–52.0)
Hemoglobin: 10.5 g/dL — ABNORMAL LOW (ref 13.0–17.0)
MCHC: 31.5 g/dL (ref 30.0–36.0)
MCV: 77.4 fl — ABNORMAL LOW (ref 78.0–100.0)
Platelets: 287 10*3/uL (ref 150.0–400.0)
RBC: 4.3 Mil/uL (ref 4.22–5.81)
RDW: 18 % — ABNORMAL HIGH (ref 11.5–15.5)
WBC: 13.9 10*3/uL — ABNORMAL HIGH (ref 4.0–10.5)

## 2020-11-26 LAB — BASIC METABOLIC PANEL
BUN: 46 mg/dL — ABNORMAL HIGH (ref 6–23)
CO2: 25 mEq/L (ref 19–32)
Calcium: 9.6 mg/dL (ref 8.4–10.5)
Chloride: 104 mEq/L (ref 96–112)
Creatinine, Ser: 1.91 mg/dL — ABNORMAL HIGH (ref 0.40–1.50)
GFR: 33.55 mL/min — ABNORMAL LOW (ref >60.00)
Glucose, Bld: 133 mg/dL — ABNORMAL HIGH (ref 70–99)
Potassium: 5 mEq/L (ref 3.5–5.1)
Sodium: 138 mEq/L (ref 135–145)

## 2020-11-27 NOTE — Telephone Encounter (Signed)
Received fax from [employer], application has been APPROVED. Coverage is from 11/27/20 to 10/17/21.  Will send document to scan Center.  Phone# [phone] Fax# [phone]  

## 2020-11-28 ENCOUNTER — Other Ambulatory Visit: Payer: Self-pay

## 2020-11-28 ENCOUNTER — Other Ambulatory Visit (INDEPENDENT_AMBULATORY_CARE_PROVIDER_SITE_OTHER): Payer: Medicare HMO

## 2020-11-28 DIAGNOSIS — N289 Disorder of kidney and ureter, unspecified: Secondary | ICD-10-CM | POA: Diagnosis not present

## 2020-11-28 DIAGNOSIS — Z01818 Encounter for other preprocedural examination: Secondary | ICD-10-CM

## 2020-11-28 LAB — CBC WITH DIFFERENTIAL/PLATELET
Basophils Absolute: 0.1 10*3/uL (ref 0.0–0.1)
Basophils Relative: 0.8 % (ref 0.0–3.0)
Eosinophils Absolute: 0.4 10*3/uL (ref 0.0–0.7)
Eosinophils Relative: 3.6 % (ref 0.0–5.0)
HCT: 32.1 % — ABNORMAL LOW (ref 39.0–52.0)
Hemoglobin: 10.4 g/dL — ABNORMAL LOW (ref 13.0–17.0)
Lymphocytes Relative: 27.5 % (ref 12.0–46.0)
Lymphs Abs: 2.8 10*3/uL (ref 0.7–4.0)
MCHC: 32.4 g/dL (ref 30.0–36.0)
MCV: 76.9 fl — ABNORMAL LOW (ref 78.0–100.0)
Monocytes Absolute: 0.7 10*3/uL (ref 0.1–1.0)
Monocytes Relative: 7.2 % (ref 3.0–12.0)
Neutro Abs: 6.1 10*3/uL (ref 1.4–7.7)
Neutrophils Relative %: 60.9 % (ref 43.0–77.0)
Platelets: 275 10*3/uL (ref 150.0–400.0)
RBC: 4.17 Mil/uL — ABNORMAL LOW (ref 4.22–5.81)
RDW: 17.9 % — ABNORMAL HIGH (ref 11.5–15.5)
WBC: 10 10*3/uL (ref 4.0–10.5)

## 2020-11-28 LAB — BASIC METABOLIC PANEL
BUN: 40 mg/dL — ABNORMAL HIGH (ref 6–23)
CO2: 26 mEq/L (ref 19–32)
Calcium: 9.5 mg/dL (ref 8.4–10.5)
Chloride: 102 mEq/L (ref 96–112)
Creatinine, Ser: 1.76 mg/dL — ABNORMAL HIGH (ref 0.40–1.50)
GFR: 37.01 mL/min — ABNORMAL LOW (ref >60.00)
Glucose, Bld: 117 mg/dL — ABNORMAL HIGH (ref 70–99)
Potassium: 4.7 mEq/L (ref 3.5–5.1)
Sodium: 139 mEq/L (ref 135–145)

## 2020-11-28 MED ORDER — ENBREL MINI 50 MG/ML ~~LOC~~ SOCT
50.0000 mg | SUBCUTANEOUS | 0 refills | Status: DC
Start: 2020-11-28 — End: 2021-03-10

## 2020-11-28 NOTE — Addendum Note (Signed)
Addended by: [name] on: 11/28/2020 03:02 PM   Modules accepted: Orders

## 2020-11-28 NOTE — Telephone Encounter (Signed)
Patient's wife calling to see how patient gets next Enbrel delivery? Patient is down to two injections. Looks like patient assistance application has been approved. What pharmacy does patient call for medication? Please call to advise.

## 2020-11-28 NOTE — Telephone Encounter (Signed)
Last Visit: 09/08/2020 Next Visit: 02/02/2021 Labs: 11/28/2020 RBC 4.17, Hgb 10.4, Hct 32.1, MCV 76.9, RDW 17.9, Glucose 117, BUN 40, Creat. 1.76, GFR 37.01 TB Gold: 04/17/2020 Neg    Current Dose per office note 09/08/2020: Enbrel mini 50 mg sq injections once weekly.  DX: Rheumatoid arthritis involving multiple sites with positive rheumatoid factor   Last Fill: 09/08/2020  Okay to refill Enbrel?

## 2020-11-28 NOTE — Addendum Note (Signed)
Addended by: Carole Binning on: 11/28/2020 02:50 PM   Modules accepted: Orders

## 2020-12-01 ENCOUNTER — Other Ambulatory Visit: Payer: Self-pay | Admitting: Family Medicine

## 2020-12-01 MED ORDER — GABAPENTIN 600 MG PO TABS: 600.0000 mg | ORAL_TABLET | Freq: Three times a day (TID) | ORAL | 0 refills | Status: DC

## 2020-12-01 MED ORDER — CARVEDILOL 12.5 MG PO TABS: 12.5000 mg | ORAL_TABLET | Freq: Two times a day (BID) | ORAL | 1 refills | Status: DC

## 2020-12-16 ENCOUNTER — Other Ambulatory Visit: Payer: Self-pay

## 2020-12-16 ENCOUNTER — Ambulatory Visit (INDEPENDENT_AMBULATORY_CARE_PROVIDER_SITE_OTHER): Payer: Medicare HMO | Admitting: Family Medicine

## 2020-12-16 DIAGNOSIS — M25522 Pain in left elbow: Secondary | ICD-10-CM

## 2020-12-16 NOTE — Progress Notes (Signed)
Office Visit Note   Patient: Tommy Green           Date of Birth: [birth year]           MRN: [id number] Visit Date: 12/16/2020 Requested by: [name], [address] [place],  [place] [address] PCP: [name], DO  Subjective: Chief Complaint  Patient presents with  . Left Elbow - Pain, Edema    Has a knot on the, states that it is sore. Thinks that he has rolled over on it at night.     HPI: He is here with recurrent left elbow pain and swelling.  Aspiration and injection a month ago helped for a little while.  It is swollen again and it hurts when he rests it against something.              ROS:   All other systems were reviewed and are negative.  Objective: Vital Signs: There were no vitals taken for this visit.  Physical Exam:  General:  Alert and oriented, in no acute distress. Pulm:  Breathing unlabored. Psy:  Normal mood, congruent affect. Skin: No erythema or warmth Left elbow: He has 1-2+ swelling of the olecranon bursa, tender to palpation but full range of motion and no joint effusion.  Imaging: No results found.  Assessment & Plan: 1.  Left elbow aseptic olecranon bursitis -He would like to have it aspirated and injected again.  Follow-up as needed.     Procedures: Left elbow aspiration and injection: After sterile prep with Betadine, injected 2 cc 0.25% bupivacaine then aspirated about 15 cc of blood-tinged synovial fluid, then injected 6 mg betamethasone and applied an Ace bandage for compression.       PMFS History: Patient Active Problem List   Diagnosis Date Noted  . Sore throat 12/14/2019  . Acute kidney injury superimposed on chronic kidney disease ([place])   . Respiratory failure ([place])   . Acute encephalopathy   . Hypernatremia   . Dyspnea   . Acute respiratory failure with hypoxemia ([place])   . SDH (subdural hematoma) ([place]) 10/24/2019  . Chronic pain of right knee 10/03/2019  . Cyclic vomiting  syndrome 08/06/2019  . Neuropathy 12/25/2018  . Bilateral hip pain 08/24/2018  . AK (actinic keratosis) 08/24/2018  . Neoplasm of uncertain behavior 20/94/7096  . 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 Tendinopathy of right gluteus medius 07/13/2018  . Tendinopathy of left gluteus medius 07/13/2018  . Squamous cell carcinoma in situ (SCCIS) of skin 03/24/2018  . Skin lesion 03/17/2018  . Vitamin D deficiency 11/29/2017  . Stage 3 chronic kidney disease ([place]) 10/28/2017  . Primary osteoarthritis of right hip 09/11/2017  . Essential hypertension 07/18/2017  . Hyperlipidemia associated with type 2 diabetes mellitus ([place]) 07/18/2017  . Type 2 diabetes mellitus with diabetic neuropathy, unspecified ([place]) 07/18/2017  . Heart murmur 07/18/2017  . Hypertriglyceridemia 07/18/2017  . History of stroke 07/18/2017  . DDD (degenerative disc disease), lumbar 06/15/2017  . Iron deficiency anemia 06/15/2017  . Stroke ([place]) 06/15/2017  . Encounter for hepatitis C screening test for low risk patient 06/30/2016  . Anemia 06/19/2016  . Microalbuminuria due to type 2 diabetes mellitus ([place]) 03/19/2016  . Hypertrophy of inferior nasal turbinate 12/20/2015  . Nasal congestion 12/15/2015  . Disease of nasal cavity and sinuses 12/10/2015  . Degenerative tear of acetabular labrum of left hip 11/04/2015  . Cellulitis and abscess of left leg 09/25/2015  . History of  tobacco abuse 05/15/2015  . Morbid (severe) obesity due to excess calories ([place]) 04/11/2015  . Disorder of both eustachian tubes 01/27/2015  . Maxillary sinusitis 01/13/2015  . Lipoprotein deficiency 01/31/2014  . Gastro-esophageal reflux disease without esophagitis 11/02/2013  . Rheumatic fever 11/02/2013  . Erectile dysfunction associated with type 2 diabetes mellitus ([place]) 07/31/2013  . Pain in joint, ankle and foot 02/06/2013  . Type 2 diabetes mellitus with hyperglycemia, without long-term current use of insulin ([place]) 11/16/2012   Past  Medical History:  Diagnosis Date  . Anemia   . Essential hypertension 07/18/2017  . History of colonic polyps   . History of rheumatic fever   . History of stroke 07/18/2017  . Hypertriglyceridemia 07/18/2017  . Type 2 diabetes mellitus with diabetic neuropathy, unspecified ([place]) 07/18/2017    Family History  Problem Relation Age of Onset  . Hyperlipidemia Mother   . Colon polyps Mother   . Hyperlipidemia Father   . Alcoholism Brother   . Healthy Son   . Healthy Son   . Cancer Neg Hx   . Colon cancer Neg Hx   . Esophageal cancer Neg Hx   . Rectal cancer Neg Hx   . Stomach cancer Neg Hx     Past Surgical History:  Procedure Laterality Date  . COLONOSCOPY     around 2018 [facility]  . COLONOSCOPY  09/02/2020  . CRANIOTOMY Left 10/25/2019   Procedure: CRANIOTOMY HEMATOMA EVACUATION SUBDURAL;  Surgeon: Vallarie Mare, MD;  Location: [facility];  Service: Neurosurgery;  Laterality: Left;  . ESOPHAGOGASTRODUODENOSCOPY     around 2018 with [facility]  . FOOT CAPSULOTOMY Left 07/25/2008   Mid Foot #2 MPJ  . Hammertoe Repair Left 07/25/2008   #2 toe  . SPINAL FUSION    . TARSAL TUNNEL RELEASE Left 07/25/2008  . UPPER GASTROINTESTINAL ENDOSCOPY  09/02/2020   Social History   Occupational History  . Not on file  Tobacco Use  . Smoking status: Former Smoker    Years: 10.00    Types: Cigarettes    Quit date: 1973    Years since quitting: 49.1  . Smokeless tobacco: Never Used  Vaping Use  . Vaping Use: Never used  Substance and Sexual Activity  . Alcohol use: Yes    Comment: occ beer  . Drug use: No  . Sexual activity: Yes

## 2020-12-17 DIAGNOSIS — N1832 Chronic kidney disease, stage 3b: Secondary | ICD-10-CM | POA: Diagnosis not present

## 2020-12-18 DIAGNOSIS — D044 Carcinoma in situ of skin of scalp and neck: Secondary | ICD-10-CM | POA: Diagnosis not present

## 2020-12-18 DIAGNOSIS — L570 Actinic keratosis: Secondary | ICD-10-CM | POA: Diagnosis not present

## 2020-12-19 ENCOUNTER — Other Ambulatory Visit: Payer: Self-pay | Admitting: Physician Assistant

## 2020-12-19 DIAGNOSIS — M0579 Rheumatoid arthritis with rheumatoid factor of multiple sites without organ or systems involvement: Secondary | ICD-10-CM

## 2020-12-19 NOTE — Telephone Encounter (Signed)
Last Visit:09/08/2020 Next Visit: 02/02/2021 Labs: 11/28/2020, RBC 4.17, Hemoglobin 10.4, HCT 32.1, MCV 76.9, RDW 17.9, Glucose 117, BUN 40, Creatinine 1.76, GFR 37.01  Current Dose per office note 09/08/2020, Arava 10 mg 1 tablet by mouth daily  DX: Rheumatoid arthritis involving multiple sites with positive rheumatoid factor   Last Fill: 10/27/2020  Okay to refill Arava?

## 2020-12-22 DIAGNOSIS — I129 Hypertensive chronic kidney disease with stage 1 through stage 4 chronic kidney disease, or unspecified chronic kidney disease: Secondary | ICD-10-CM | POA: Diagnosis not present

## 2020-12-22 DIAGNOSIS — E875 Hyperkalemia: Secondary | ICD-10-CM | POA: Diagnosis not present

## 2020-12-22 DIAGNOSIS — N189 Chronic kidney disease, unspecified: Secondary | ICD-10-CM | POA: Diagnosis not present

## 2020-12-22 DIAGNOSIS — N2581 Secondary hyperparathyroidism of renal origin: Secondary | ICD-10-CM | POA: Diagnosis not present

## 2020-12-22 DIAGNOSIS — E785 Hyperlipidemia, unspecified: Secondary | ICD-10-CM | POA: Diagnosis not present

## 2020-12-22 DIAGNOSIS — D509 Iron deficiency anemia, unspecified: Secondary | ICD-10-CM | POA: Diagnosis not present

## 2020-12-22 DIAGNOSIS — N1832 Chronic kidney disease, stage 3b: Secondary | ICD-10-CM | POA: Diagnosis not present

## 2020-12-22 DIAGNOSIS — E872 Acidosis: Secondary | ICD-10-CM | POA: Diagnosis not present

## 2020-12-23 ENCOUNTER — Telehealth: Payer: Self-pay | Admitting: *Deleted

## 2020-12-23 NOTE — Telephone Encounter (Signed)
Labs received from [facility] on:12/22/2020 Reviewed by: Hazel Sams, PA-C  Labs drawn:Renal Function Panel, FETIBC, Ferritin  Results: Glucose 162 BUN 52 Creatinine 1.52 eGFR 47 BUN/Creatinine Ratio 34 Iron 23 Iron Saturation 6%  F/u 2 months  Iron def anemia, advised to f/u w/PCP for GI bleed workup.  Patient is on Enbrel 50 mg inj weekly Arava 10 mg daily

## 2020-12-26 ENCOUNTER — Other Ambulatory Visit: Payer: Self-pay | Admitting: Physician Assistant

## 2020-12-26 DIAGNOSIS — M0579 Rheumatoid arthritis with rheumatoid factor of multiple sites without organ or systems involvement: Secondary | ICD-10-CM

## 2020-12-26 NOTE — Telephone Encounter (Signed)
Next Visit:02/02/2021  Last Visit: 09/08/2020  Last Fill: 12/19/2020, insurance will not pay for 30 day supply, RX for 30 day supply cancelled, 90 day supply requested  Current Dose per office note 09/08/2020, Arava 10 mg 1 tablet by mouth daily  DX: Rheumatoid arthritis involving multiple sites with positive rheumatoid factor   Labs: Labs received from [facility] on:12/22/2020 Reviewed by: Hazel Sams, PA-C  Labs drawn:Renal Function Panel, FETIBC, Ferritin  Results: Glucose 162 BUN 52 Creatinine 1.52 eGFR 47 BUN/Creatinine Ratio 34 Iron 23 Iron Saturation 6%  F/u 2 months  Iron def anemia, advised to f/u w/PCP for GI bleed workup.

## 2021-01-01 DIAGNOSIS — M4150 Other secondary scoliosis, site unspecified: Secondary | ICD-10-CM | POA: Diagnosis not present

## 2021-01-01 DIAGNOSIS — M4010 Other secondary kyphosis, site unspecified: Secondary | ICD-10-CM | POA: Diagnosis not present

## 2021-01-01 DIAGNOSIS — M48062 Spinal stenosis, lumbar region with neurogenic claudication: Secondary | ICD-10-CM | POA: Diagnosis not present

## 2021-01-01 DIAGNOSIS — M4716 Other spondylosis with myelopathy, lumbar region: Secondary | ICD-10-CM | POA: Diagnosis not present

## 2021-01-07 ENCOUNTER — Encounter: Payer: Self-pay | Admitting: Family Medicine

## 2021-01-07 ENCOUNTER — Other Ambulatory Visit: Payer: Self-pay

## 2021-01-07 ENCOUNTER — Ambulatory Visit (INDEPENDENT_AMBULATORY_CARE_PROVIDER_SITE_OTHER): Payer: Medicare HMO | Admitting: Family Medicine

## 2021-01-07 DIAGNOSIS — M25422 Effusion, left elbow: Secondary | ICD-10-CM

## 2021-01-07 NOTE — Progress Notes (Signed)
Office Visit Note   Patient: Tommy Green           Date of Birth: 03-Jan-1946           MRN: [id number] Visit Date: 01/07/2021 Requested by: [name], [address] [place],  [place] [address] PCP: [name], DO  Subjective: Chief Complaint  Patient presents with  . Left Elbow - Pain    The elbow bursa is filled with fluid again about 5 days after the last aspiration. Just had it aspirated and injected on 12/16/20. Had had it aspirated 1 month before that.     HPI: He is here with recurrent left elbow swelling.  We have aspirated and injected his olecranon bursa twice, most recently March 1.  It only helped about 5 days and then it was swollen again.  He has spine surgery coming up in a month per Dr. Patrice Paradise.              ROS:   All other systems were reviewed and are negative.  Objective: Vital Signs: There were no vitals taken for this visit.  Physical Exam:  General:  Alert and oriented, in no acute distress. Pulm:  Breathing unlabored. Psy:  Normal mood, congruent affect. Skin: No erythema or warmth Left elbow: He has 2+ swelling of the olecranon bursa with minimal tenderness.  Good range of motion of the elbow with no effusion.  Imaging: No results found.  Assessment & Plan: 1.  Recurrent left elbow aseptic olecranon bursitis -Discussed options and elected to aspirate 1 more time and.  We did not inject cortisone due to upcoming back surgery.  If this fails to give lasting results, consider having one of our surgeons perform olecranon bursectomy.     Procedures: Left elbow aspiration: After sterile prep with Betadine, injected 2 cc 0.25% bupivacaine, then aspirated 12 cc blood-tinged synovial fluid.  Ace bandage applied for compression.       PMFS History: Patient Active Problem List   Diagnosis Date Noted  . Body mass index (BMI) 36.0-36.9, adult 10/03/2020  . Subdural hemorrhage following injury without open  intracranial wound and with prolonged loss of consciousness (more than 24 hours) without return to pre-existing conscious level ([place]) 10/03/2020  . Sore throat 12/14/2019  . Acute kidney injury superimposed on chronic kidney disease ([place])   . Respiratory failure ([place])   . Acute encephalopathy   . Hypernatremia   . Dyspnea   . Acute respiratory failure with hypoxemia ([place])   . SDH (subdural hematoma) ([place]) 10/24/2019  . Chronic pain of right knee 10/03/2019  . Cyclic vomiting syndrome 08/06/2019  . Neuropathy 12/25/2018  . Bilateral hip pain 08/24/2018  . AK (actinic keratosis) 08/24/2018  . Neoplasm of uncertain behavior 56/38/7564  . 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 Tendinopathy of right gluteus medius 07/13/2018  . Tendinopathy of left gluteus medius 07/13/2018  . Squamous cell carcinoma in situ (SCCIS) of skin 03/24/2018  . Skin lesion 03/17/2018  . Vitamin D deficiency 11/29/2017  . Stage 3 chronic kidney disease ([place]) 10/28/2017  . Primary osteoarthritis of right hip 09/11/2017  . Essential hypertension 07/18/2017  . Hyperlipidemia associated with type 2 diabetes mellitus ([place]) 07/18/2017  . Type 2 diabetes mellitus with diabetic neuropathy, unspecified ([place]) 07/18/2017  . Heart murmur 07/18/2017  . Hypertriglyceridemia 07/18/2017  . History of stroke 07/18/2017  . DDD (degenerative disc disease), lumbar 06/15/2017  . Iron deficiency anemia 06/15/2017  . Stroke (  [place]) 06/15/2017  . Encounter for hepatitis C screening test for low risk patient 06/30/2016  . Anemia 06/19/2016  . Microalbuminuria due to type 2 diabetes mellitus ([place]) 03/19/2016  . Hypertrophy of inferior nasal turbinate 12/20/2015  . Nasal congestion 12/15/2015  . Disease of nasal cavity and sinuses 12/10/2015  . Degenerative tear of acetabular labrum of left hip 11/04/2015  . Cellulitis and abscess of left leg 09/25/2015  . History of tobacco abuse 05/15/2015  . Morbid (severe) obesity due to excess  calories ([place]) 04/11/2015  . Disorder of both eustachian tubes 01/27/2015  . Maxillary sinusitis 01/13/2015  . Lipoprotein deficiency 01/31/2014  . Gastro-esophageal reflux disease without esophagitis 11/02/2013  . Rheumatic fever 11/02/2013  . Erectile dysfunction associated with type 2 diabetes mellitus ([place]) 07/31/2013  . Pain in joint, ankle and foot 02/06/2013  . Type 2 diabetes mellitus with hyperglycemia, without long-term current use of insulin ([place]) 11/16/2012   Past Medical History:  Diagnosis Date  . Anemia   . Essential hypertension 07/18/2017  . History of colonic polyps   . History of rheumatic fever   . History of stroke 07/18/2017  . Hypertriglyceridemia 07/18/2017  . Type 2 diabetes mellitus with diabetic neuropathy, unspecified ([place]) 07/18/2017    Family History  Problem Relation Age of Onset  . Hyperlipidemia Mother   . Colon polyps Mother   . Hyperlipidemia Father   . Alcoholism Brother   . Healthy Son   . Healthy Son   . Cancer Neg Hx   . Colon cancer Neg Hx   . Esophageal cancer Neg Hx   . Rectal cancer Neg Hx   . Stomach cancer Neg Hx     Past Surgical History:  Procedure Laterality Date  . COLONOSCOPY     around 2018 [facility]  . COLONOSCOPY  09/02/2020  . CRANIOTOMY Left 10/25/2019   Procedure: CRANIOTOMY HEMATOMA EVACUATION SUBDURAL;  Surgeon: Vallarie Mare, MD;  Location: [facility];  Service: Neurosurgery;  Laterality: Left;  . ESOPHAGOGASTRODUODENOSCOPY     around 2018 with [facility]  . FOOT CAPSULOTOMY Left 07/25/2008   Mid Foot #2 MPJ  . Hammertoe Repair Left 07/25/2008   #2 toe  . SPINAL FUSION    . TARSAL TUNNEL RELEASE Left 07/25/2008  . UPPER GASTROINTESTINAL ENDOSCOPY  09/02/2020   Social History   Occupational History  . Not on file  Tobacco Use  . Smoking status: Former Smoker    Years: 10.00    Types: Cigarettes    Quit date: 1973    Years since quitting: 49.2  . Smokeless tobacco: Never Used  Vaping Use  . Vaping  Use: Never used  Substance and Sexual Activity  . Alcohol use: Yes    Comment: occ beer  . Drug use: No  . Sexual activity: Yes

## 2021-01-16 DIAGNOSIS — M4150 Other secondary scoliosis, site unspecified: Secondary | ICD-10-CM | POA: Diagnosis not present

## 2021-01-16 DIAGNOSIS — M4010 Other secondary kyphosis, site unspecified: Secondary | ICD-10-CM | POA: Diagnosis not present

## 2021-01-16 DIAGNOSIS — M4716 Other spondylosis with myelopathy, lumbar region: Secondary | ICD-10-CM | POA: Diagnosis not present

## 2021-01-16 DIAGNOSIS — Z4689 Encounter for fitting and adjustment of other specified devices: Secondary | ICD-10-CM | POA: Diagnosis not present

## 2021-01-16 DIAGNOSIS — M7022 Olecranon bursitis, left elbow: Secondary | ICD-10-CM | POA: Diagnosis not present

## 2021-01-16 DIAGNOSIS — M48062 Spinal stenosis, lumbar region with neurogenic claudication: Secondary | ICD-10-CM | POA: Diagnosis not present

## 2021-01-16 HISTORY — PX: SPINAL FUSION: SHX223

## 2021-01-26 DIAGNOSIS — M48061 Spinal stenosis, lumbar region without neurogenic claudication: Secondary | ICD-10-CM | POA: Diagnosis not present

## 2021-01-26 DIAGNOSIS — Z01812 Encounter for preprocedural laboratory examination: Secondary | ICD-10-CM | POA: Diagnosis not present

## 2021-01-26 DIAGNOSIS — Z01810 Encounter for preprocedural cardiovascular examination: Secondary | ICD-10-CM | POA: Diagnosis not present

## 2021-01-27 DIAGNOSIS — I498 Other specified cardiac arrhythmias: Secondary | ICD-10-CM | POA: Diagnosis not present

## 2021-01-27 DIAGNOSIS — I493 Ventricular premature depolarization: Secondary | ICD-10-CM | POA: Diagnosis not present

## 2021-02-02 ENCOUNTER — Ambulatory Visit: Payer: Medicare HMO | Admitting: Physician Assistant

## 2021-02-02 DIAGNOSIS — E119 Type 2 diabetes mellitus without complications: Secondary | ICD-10-CM | POA: Diagnosis not present

## 2021-02-02 DIAGNOSIS — R7989 Other specified abnormal findings of blood chemistry: Secondary | ICD-10-CM | POA: Diagnosis not present

## 2021-02-02 DIAGNOSIS — N400 Enlarged prostate without lower urinary tract symptoms: Secondary | ICD-10-CM | POA: Diagnosis not present

## 2021-02-02 DIAGNOSIS — M5416 Radiculopathy, lumbar region: Secondary | ICD-10-CM | POA: Diagnosis not present

## 2021-02-02 DIAGNOSIS — I621 Nontraumatic extradural hemorrhage: Secondary | ICD-10-CM | POA: Diagnosis not present

## 2021-02-02 DIAGNOSIS — Z86010 Personal history of colonic polyps: Secondary | ICD-10-CM | POA: Diagnosis not present

## 2021-02-02 DIAGNOSIS — E1140 Type 2 diabetes mellitus with diabetic neuropathy, unspecified: Secondary | ICD-10-CM | POA: Diagnosis not present

## 2021-02-02 DIAGNOSIS — M4150 Other secondary scoliosis, site unspecified: Secondary | ICD-10-CM | POA: Diagnosis not present

## 2021-02-02 DIAGNOSIS — Z4889 Encounter for other specified surgical aftercare: Secondary | ICD-10-CM | POA: Diagnosis not present

## 2021-02-02 DIAGNOSIS — M4807 Spinal stenosis, lumbosacral region: Secondary | ICD-10-CM | POA: Diagnosis not present

## 2021-02-02 DIAGNOSIS — M4324 Fusion of spine, thoracic region: Secondary | ICD-10-CM | POA: Diagnosis not present

## 2021-02-02 DIAGNOSIS — Z981 Arthrodesis status: Secondary | ICD-10-CM | POA: Diagnosis not present

## 2021-02-02 DIAGNOSIS — M419 Scoliosis, unspecified: Secondary | ICD-10-CM | POA: Diagnosis not present

## 2021-02-02 DIAGNOSIS — T8119XA Other postprocedural shock, initial encounter: Secondary | ICD-10-CM | POA: Diagnosis not present

## 2021-02-02 DIAGNOSIS — G8929 Other chronic pain: Secondary | ICD-10-CM | POA: Diagnosis not present

## 2021-02-02 DIAGNOSIS — M069 Rheumatoid arthritis, unspecified: Secondary | ICD-10-CM | POA: Diagnosis not present

## 2021-02-02 DIAGNOSIS — K3189 Other diseases of stomach and duodenum: Secondary | ICD-10-CM | POA: Diagnosis not present

## 2021-02-02 DIAGNOSIS — Z8673 Personal history of transient ischemic attack (TIA), and cerebral infarction without residual deficits: Secondary | ICD-10-CM | POA: Diagnosis not present

## 2021-02-02 DIAGNOSIS — M4326 Fusion of spine, lumbar region: Secondary | ICD-10-CM | POA: Diagnosis not present

## 2021-02-02 DIAGNOSIS — E1122 Type 2 diabetes mellitus with diabetic chronic kidney disease: Secondary | ICD-10-CM | POA: Diagnosis not present

## 2021-02-02 DIAGNOSIS — R9431 Abnormal electrocardiogram [ECG] [EKG]: Secondary | ICD-10-CM | POA: Diagnosis not present

## 2021-02-02 DIAGNOSIS — E872 Acidosis: Secondary | ICD-10-CM | POA: Diagnosis not present

## 2021-02-02 DIAGNOSIS — I350 Nonrheumatic aortic (valve) stenosis: Secondary | ICD-10-CM | POA: Diagnosis not present

## 2021-02-02 DIAGNOSIS — E785 Hyperlipidemia, unspecified: Secondary | ICD-10-CM | POA: Diagnosis not present

## 2021-02-02 DIAGNOSIS — I878 Other specified disorders of veins: Secondary | ICD-10-CM | POA: Diagnosis not present

## 2021-02-02 DIAGNOSIS — D638 Anemia in other chronic diseases classified elsewhere: Secondary | ICD-10-CM | POA: Diagnosis not present

## 2021-02-02 DIAGNOSIS — M5135 Other intervertebral disc degeneration, thoracolumbar region: Secondary | ICD-10-CM | POA: Diagnosis not present

## 2021-02-02 DIAGNOSIS — M5127 Other intervertebral disc displacement, lumbosacral region: Secondary | ICD-10-CM | POA: Diagnosis not present

## 2021-02-02 DIAGNOSIS — M4010 Other secondary kyphosis, site unspecified: Secondary | ICD-10-CM | POA: Diagnosis not present

## 2021-02-02 DIAGNOSIS — I129 Hypertensive chronic kidney disease with stage 1 through stage 4 chronic kidney disease, or unspecified chronic kidney disease: Secondary | ICD-10-CM | POA: Diagnosis not present

## 2021-02-02 DIAGNOSIS — N189 Chronic kidney disease, unspecified: Secondary | ICD-10-CM | POA: Diagnosis not present

## 2021-02-02 DIAGNOSIS — R579 Shock, unspecified: Secondary | ICD-10-CM | POA: Diagnosis not present

## 2021-02-02 DIAGNOSIS — M5124 Other intervertebral disc displacement, thoracic region: Secondary | ICD-10-CM | POA: Diagnosis not present

## 2021-02-02 DIAGNOSIS — N170 Acute kidney failure with tubular necrosis: Secondary | ICD-10-CM | POA: Diagnosis not present

## 2021-02-02 DIAGNOSIS — R059 Cough, unspecified: Secondary | ICD-10-CM | POA: Diagnosis not present

## 2021-02-02 DIAGNOSIS — I9581 Postprocedural hypotension: Secondary | ICD-10-CM | POA: Diagnosis not present

## 2021-02-02 DIAGNOSIS — D6489 Other specified anemias: Secondary | ICD-10-CM | POA: Diagnosis not present

## 2021-02-02 DIAGNOSIS — Z9889 Other specified postprocedural states: Secondary | ICD-10-CM | POA: Diagnosis not present

## 2021-02-02 DIAGNOSIS — M4716 Other spondylosis with myelopathy, lumbar region: Secondary | ICD-10-CM | POA: Diagnosis not present

## 2021-02-02 DIAGNOSIS — N179 Acute kidney failure, unspecified: Secondary | ICD-10-CM | POA: Diagnosis not present

## 2021-02-02 DIAGNOSIS — Z794 Long term (current) use of insulin: Secondary | ICD-10-CM | POA: Diagnosis not present

## 2021-02-02 DIAGNOSIS — R571 Hypovolemic shock: Secondary | ICD-10-CM | POA: Diagnosis not present

## 2021-02-02 DIAGNOSIS — D72829 Elevated white blood cell count, unspecified: Secondary | ICD-10-CM | POA: Diagnosis not present

## 2021-02-02 DIAGNOSIS — M6282 Rhabdomyolysis: Secondary | ICD-10-CM | POA: Diagnosis not present

## 2021-02-02 DIAGNOSIS — M48062 Spinal stenosis, lumbar region with neurogenic claudication: Secondary | ICD-10-CM | POA: Diagnosis not present

## 2021-02-02 DIAGNOSIS — M5126 Other intervertebral disc displacement, lumbar region: Secondary | ICD-10-CM | POA: Diagnosis not present

## 2021-02-02 DIAGNOSIS — M5136 Other intervertebral disc degeneration, lumbar region: Secondary | ICD-10-CM | POA: Diagnosis not present

## 2021-02-02 DIAGNOSIS — K5900 Constipation, unspecified: Secondary | ICD-10-CM | POA: Diagnosis not present

## 2021-02-02 DIAGNOSIS — J9589 Other postprocedural complications and disorders of respiratory system, not elsewhere classified: Secondary | ICD-10-CM | POA: Diagnosis not present

## 2021-02-02 DIAGNOSIS — Z452 Encounter for adjustment and management of vascular access device: Secondary | ICD-10-CM | POA: Diagnosis not present

## 2021-02-02 DIAGNOSIS — I10 Essential (primary) hypertension: Secondary | ICD-10-CM | POA: Diagnosis not present

## 2021-02-02 DIAGNOSIS — I959 Hypotension, unspecified: Secondary | ICD-10-CM | POA: Diagnosis not present

## 2021-02-02 DIAGNOSIS — D62 Acute posthemorrhagic anemia: Secondary | ICD-10-CM | POA: Diagnosis not present

## 2021-02-02 DIAGNOSIS — G9519 Other vascular myelopathies: Secondary | ICD-10-CM | POA: Diagnosis not present

## 2021-02-02 DIAGNOSIS — M96840 Postprocedural hematoma of a musculoskeletal structure following a musculoskeletal system procedure: Secondary | ICD-10-CM | POA: Diagnosis not present

## 2021-02-02 DIAGNOSIS — Z20822 Contact with and (suspected) exposure to covid-19: Secondary | ICD-10-CM | POA: Diagnosis not present

## 2021-02-02 DIAGNOSIS — D509 Iron deficiency anemia, unspecified: Secondary | ICD-10-CM | POA: Diagnosis not present

## 2021-02-02 DIAGNOSIS — N183 Chronic kidney disease, stage 3 unspecified: Secondary | ICD-10-CM | POA: Diagnosis not present

## 2021-02-02 DIAGNOSIS — R531 Weakness: Secondary | ICD-10-CM | POA: Diagnosis not present

## 2021-02-02 DIAGNOSIS — J9811 Atelectasis: Secondary | ICD-10-CM | POA: Diagnosis not present

## 2021-02-02 DIAGNOSIS — K6389 Other specified diseases of intestine: Secondary | ICD-10-CM | POA: Diagnosis not present

## 2021-02-02 DIAGNOSIS — M47816 Spondylosis without myelopathy or radiculopathy, lumbar region: Secondary | ICD-10-CM | POA: Diagnosis not present

## 2021-02-02 DIAGNOSIS — E1165 Type 2 diabetes mellitus with hyperglycemia: Secondary | ICD-10-CM | POA: Diagnosis not present

## 2021-02-02 DIAGNOSIS — D631 Anemia in chronic kidney disease: Secondary | ICD-10-CM | POA: Diagnosis not present

## 2021-02-02 DIAGNOSIS — M5134 Other intervertebral disc degeneration, thoracic region: Secondary | ICD-10-CM | POA: Diagnosis not present

## 2021-02-02 DIAGNOSIS — J9601 Acute respiratory failure with hypoxia: Secondary | ICD-10-CM | POA: Diagnosis not present

## 2021-02-02 DIAGNOSIS — M4804 Spinal stenosis, thoracic region: Secondary | ICD-10-CM | POA: Diagnosis not present

## 2021-02-02 DIAGNOSIS — M4185 Other forms of scoliosis, thoracolumbar region: Secondary | ICD-10-CM | POA: Diagnosis not present

## 2021-02-02 DIAGNOSIS — M549 Dorsalgia, unspecified: Secondary | ICD-10-CM | POA: Diagnosis not present

## 2021-02-02 DIAGNOSIS — M4180 Other forms of scoliosis, site unspecified: Secondary | ICD-10-CM | POA: Diagnosis not present

## 2021-02-02 DIAGNOSIS — D649 Anemia, unspecified: Secondary | ICD-10-CM | POA: Diagnosis not present

## 2021-02-02 DIAGNOSIS — R7401 Elevation of levels of liver transaminase levels: Secondary | ICD-10-CM | POA: Diagnosis not present

## 2021-02-03 DIAGNOSIS — N189 Chronic kidney disease, unspecified: Secondary | ICD-10-CM | POA: Diagnosis not present

## 2021-02-03 DIAGNOSIS — E119 Type 2 diabetes mellitus without complications: Secondary | ICD-10-CM | POA: Diagnosis not present

## 2021-02-03 DIAGNOSIS — M069 Rheumatoid arthritis, unspecified: Secondary | ICD-10-CM | POA: Diagnosis not present

## 2021-02-03 DIAGNOSIS — M4326 Fusion of spine, lumbar region: Secondary | ICD-10-CM | POA: Diagnosis not present

## 2021-02-03 DIAGNOSIS — M549 Dorsalgia, unspecified: Secondary | ICD-10-CM | POA: Diagnosis not present

## 2021-02-03 DIAGNOSIS — K6389 Other specified diseases of intestine: Secondary | ICD-10-CM | POA: Diagnosis not present

## 2021-02-03 DIAGNOSIS — R9431 Abnormal electrocardiogram [ECG] [EKG]: Secondary | ICD-10-CM | POA: Diagnosis not present

## 2021-02-03 DIAGNOSIS — N179 Acute kidney failure, unspecified: Secondary | ICD-10-CM | POA: Diagnosis not present

## 2021-02-03 DIAGNOSIS — E785 Hyperlipidemia, unspecified: Secondary | ICD-10-CM | POA: Diagnosis not present

## 2021-02-03 DIAGNOSIS — Z981 Arthrodesis status: Secondary | ICD-10-CM | POA: Diagnosis not present

## 2021-02-03 DIAGNOSIS — Z9889 Other specified postprocedural states: Secondary | ICD-10-CM | POA: Diagnosis not present

## 2021-02-03 DIAGNOSIS — R059 Cough, unspecified: Secondary | ICD-10-CM | POA: Diagnosis not present

## 2021-02-03 DIAGNOSIS — I9581 Postprocedural hypotension: Secondary | ICD-10-CM | POA: Diagnosis not present

## 2021-02-03 DIAGNOSIS — K3189 Other diseases of stomach and duodenum: Secondary | ICD-10-CM | POA: Diagnosis not present

## 2021-02-03 DIAGNOSIS — D62 Acute posthemorrhagic anemia: Secondary | ICD-10-CM | POA: Diagnosis not present

## 2021-02-03 DIAGNOSIS — M4324 Fusion of spine, thoracic region: Secondary | ICD-10-CM | POA: Diagnosis not present

## 2021-02-03 DIAGNOSIS — I959 Hypotension, unspecified: Secondary | ICD-10-CM | POA: Diagnosis not present

## 2021-02-03 DIAGNOSIS — G8929 Other chronic pain: Secondary | ICD-10-CM | POA: Diagnosis not present

## 2021-02-03 DIAGNOSIS — E1165 Type 2 diabetes mellitus with hyperglycemia: Secondary | ICD-10-CM | POA: Diagnosis not present

## 2021-02-04 DIAGNOSIS — N183 Chronic kidney disease, stage 3 unspecified: Secondary | ICD-10-CM | POA: Diagnosis not present

## 2021-02-04 DIAGNOSIS — D509 Iron deficiency anemia, unspecified: Secondary | ICD-10-CM | POA: Diagnosis not present

## 2021-02-04 DIAGNOSIS — D6489 Other specified anemias: Secondary | ICD-10-CM | POA: Diagnosis not present

## 2021-02-04 DIAGNOSIS — J9601 Acute respiratory failure with hypoxia: Secondary | ICD-10-CM | POA: Diagnosis not present

## 2021-02-04 DIAGNOSIS — D649 Anemia, unspecified: Secondary | ICD-10-CM | POA: Diagnosis not present

## 2021-02-04 DIAGNOSIS — R7989 Other specified abnormal findings of blood chemistry: Secondary | ICD-10-CM | POA: Diagnosis not present

## 2021-02-04 DIAGNOSIS — D62 Acute posthemorrhagic anemia: Secondary | ICD-10-CM | POA: Diagnosis not present

## 2021-02-04 DIAGNOSIS — R579 Shock, unspecified: Secondary | ICD-10-CM | POA: Diagnosis not present

## 2021-02-04 DIAGNOSIS — N189 Chronic kidney disease, unspecified: Secondary | ICD-10-CM | POA: Diagnosis not present

## 2021-02-04 DIAGNOSIS — I10 Essential (primary) hypertension: Secondary | ICD-10-CM | POA: Diagnosis not present

## 2021-02-04 DIAGNOSIS — J9811 Atelectasis: Secondary | ICD-10-CM | POA: Diagnosis not present

## 2021-02-04 DIAGNOSIS — J9589 Other postprocedural complications and disorders of respiratory system, not elsewhere classified: Secondary | ICD-10-CM | POA: Diagnosis not present

## 2021-02-04 DIAGNOSIS — N179 Acute kidney failure, unspecified: Secondary | ICD-10-CM | POA: Diagnosis not present

## 2021-02-04 DIAGNOSIS — I350 Nonrheumatic aortic (valve) stenosis: Secondary | ICD-10-CM | POA: Diagnosis not present

## 2021-02-04 DIAGNOSIS — I959 Hypotension, unspecified: Secondary | ICD-10-CM | POA: Diagnosis not present

## 2021-02-04 DIAGNOSIS — E119 Type 2 diabetes mellitus without complications: Secondary | ICD-10-CM | POA: Diagnosis not present

## 2021-02-04 DIAGNOSIS — R571 Hypovolemic shock: Secondary | ICD-10-CM | POA: Diagnosis not present

## 2021-02-04 DIAGNOSIS — I129 Hypertensive chronic kidney disease with stage 1 through stage 4 chronic kidney disease, or unspecified chronic kidney disease: Secondary | ICD-10-CM | POA: Diagnosis not present

## 2021-02-04 DIAGNOSIS — Z86010 Personal history of colonic polyps: Secondary | ICD-10-CM | POA: Diagnosis not present

## 2021-02-04 DIAGNOSIS — N400 Enlarged prostate without lower urinary tract symptoms: Secondary | ICD-10-CM | POA: Diagnosis not present

## 2021-02-04 DIAGNOSIS — E1122 Type 2 diabetes mellitus with diabetic chronic kidney disease: Secondary | ICD-10-CM | POA: Diagnosis not present

## 2021-02-04 DIAGNOSIS — D72829 Elevated white blood cell count, unspecified: Secondary | ICD-10-CM | POA: Diagnosis not present

## 2021-02-04 DIAGNOSIS — E872 Acidosis: Secondary | ICD-10-CM | POA: Diagnosis not present

## 2021-02-05 DIAGNOSIS — I878 Other specified disorders of veins: Secondary | ICD-10-CM | POA: Diagnosis not present

## 2021-02-05 DIAGNOSIS — I350 Nonrheumatic aortic (valve) stenosis: Secondary | ICD-10-CM | POA: Diagnosis not present

## 2021-02-05 DIAGNOSIS — J9601 Acute respiratory failure with hypoxia: Secondary | ICD-10-CM | POA: Diagnosis not present

## 2021-02-05 DIAGNOSIS — M5127 Other intervertebral disc displacement, lumbosacral region: Secondary | ICD-10-CM | POA: Diagnosis not present

## 2021-02-05 DIAGNOSIS — K5900 Constipation, unspecified: Secondary | ICD-10-CM | POA: Diagnosis not present

## 2021-02-05 DIAGNOSIS — N189 Chronic kidney disease, unspecified: Secondary | ICD-10-CM | POA: Diagnosis not present

## 2021-02-05 DIAGNOSIS — M5126 Other intervertebral disc displacement, lumbar region: Secondary | ICD-10-CM | POA: Diagnosis not present

## 2021-02-05 DIAGNOSIS — I129 Hypertensive chronic kidney disease with stage 1 through stage 4 chronic kidney disease, or unspecified chronic kidney disease: Secondary | ICD-10-CM | POA: Diagnosis not present

## 2021-02-05 DIAGNOSIS — M419 Scoliosis, unspecified: Secondary | ICD-10-CM | POA: Diagnosis not present

## 2021-02-05 DIAGNOSIS — K3189 Other diseases of stomach and duodenum: Secondary | ICD-10-CM | POA: Diagnosis not present

## 2021-02-05 DIAGNOSIS — N183 Chronic kidney disease, stage 3 unspecified: Secondary | ICD-10-CM | POA: Diagnosis not present

## 2021-02-05 DIAGNOSIS — Z981 Arthrodesis status: Secondary | ICD-10-CM | POA: Diagnosis not present

## 2021-02-05 DIAGNOSIS — D62 Acute posthemorrhagic anemia: Secondary | ICD-10-CM | POA: Diagnosis not present

## 2021-02-05 DIAGNOSIS — T8119XA Other postprocedural shock, initial encounter: Secondary | ICD-10-CM | POA: Diagnosis not present

## 2021-02-05 DIAGNOSIS — I959 Hypotension, unspecified: Secondary | ICD-10-CM | POA: Diagnosis not present

## 2021-02-05 DIAGNOSIS — Z452 Encounter for adjustment and management of vascular access device: Secondary | ICD-10-CM | POA: Diagnosis not present

## 2021-02-05 DIAGNOSIS — M5124 Other intervertebral disc displacement, thoracic region: Secondary | ICD-10-CM | POA: Diagnosis not present

## 2021-02-05 DIAGNOSIS — M4804 Spinal stenosis, thoracic region: Secondary | ICD-10-CM | POA: Diagnosis not present

## 2021-02-05 DIAGNOSIS — R531 Weakness: Secondary | ICD-10-CM | POA: Diagnosis not present

## 2021-02-05 DIAGNOSIS — D649 Anemia, unspecified: Secondary | ICD-10-CM | POA: Diagnosis not present

## 2021-02-05 DIAGNOSIS — M4807 Spinal stenosis, lumbosacral region: Secondary | ICD-10-CM | POA: Diagnosis not present

## 2021-02-05 DIAGNOSIS — M4326 Fusion of spine, lumbar region: Secondary | ICD-10-CM | POA: Diagnosis not present

## 2021-02-05 DIAGNOSIS — R7401 Elevation of levels of liver transaminase levels: Secondary | ICD-10-CM | POA: Diagnosis not present

## 2021-02-05 DIAGNOSIS — N179 Acute kidney failure, unspecified: Secondary | ICD-10-CM | POA: Diagnosis not present

## 2021-02-05 DIAGNOSIS — E872 Acidosis: Secondary | ICD-10-CM | POA: Diagnosis not present

## 2021-02-05 DIAGNOSIS — R579 Shock, unspecified: Secondary | ICD-10-CM | POA: Diagnosis not present

## 2021-02-05 DIAGNOSIS — K6389 Other specified diseases of intestine: Secondary | ICD-10-CM | POA: Diagnosis not present

## 2021-02-06 DIAGNOSIS — D62 Acute posthemorrhagic anemia: Secondary | ICD-10-CM | POA: Diagnosis not present

## 2021-02-06 DIAGNOSIS — D631 Anemia in chronic kidney disease: Secondary | ICD-10-CM | POA: Diagnosis not present

## 2021-02-06 DIAGNOSIS — M6282 Rhabdomyolysis: Secondary | ICD-10-CM | POA: Diagnosis not present

## 2021-02-06 DIAGNOSIS — R579 Shock, unspecified: Secondary | ICD-10-CM | POA: Diagnosis not present

## 2021-02-06 DIAGNOSIS — N183 Chronic kidney disease, stage 3 unspecified: Secondary | ICD-10-CM | POA: Diagnosis not present

## 2021-02-06 DIAGNOSIS — E1140 Type 2 diabetes mellitus with diabetic neuropathy, unspecified: Secondary | ICD-10-CM | POA: Diagnosis not present

## 2021-02-06 DIAGNOSIS — M5134 Other intervertebral disc degeneration, thoracic region: Secondary | ICD-10-CM | POA: Diagnosis not present

## 2021-02-06 DIAGNOSIS — M5416 Radiculopathy, lumbar region: Secondary | ICD-10-CM | POA: Diagnosis not present

## 2021-02-06 DIAGNOSIS — E1122 Type 2 diabetes mellitus with diabetic chronic kidney disease: Secondary | ICD-10-CM | POA: Diagnosis not present

## 2021-02-06 DIAGNOSIS — E872 Acidosis: Secondary | ICD-10-CM | POA: Diagnosis not present

## 2021-02-06 DIAGNOSIS — Z8673 Personal history of transient ischemic attack (TIA), and cerebral infarction without residual deficits: Secondary | ICD-10-CM | POA: Diagnosis not present

## 2021-02-06 DIAGNOSIS — I129 Hypertensive chronic kidney disease with stage 1 through stage 4 chronic kidney disease, or unspecified chronic kidney disease: Secondary | ICD-10-CM | POA: Diagnosis not present

## 2021-02-06 DIAGNOSIS — D649 Anemia, unspecified: Secondary | ICD-10-CM | POA: Diagnosis not present

## 2021-02-06 DIAGNOSIS — N179 Acute kidney failure, unspecified: Secondary | ICD-10-CM | POA: Diagnosis not present

## 2021-02-06 DIAGNOSIS — N189 Chronic kidney disease, unspecified: Secondary | ICD-10-CM | POA: Diagnosis not present

## 2021-02-06 DIAGNOSIS — I350 Nonrheumatic aortic (valve) stenosis: Secondary | ICD-10-CM | POA: Diagnosis not present

## 2021-02-06 DIAGNOSIS — R571 Hypovolemic shock: Secondary | ICD-10-CM | POA: Diagnosis not present

## 2021-02-06 DIAGNOSIS — Z794 Long term (current) use of insulin: Secondary | ICD-10-CM | POA: Diagnosis not present

## 2021-02-07 DIAGNOSIS — N189 Chronic kidney disease, unspecified: Secondary | ICD-10-CM | POA: Diagnosis not present

## 2021-02-07 DIAGNOSIS — D649 Anemia, unspecified: Secondary | ICD-10-CM | POA: Diagnosis not present

## 2021-02-07 DIAGNOSIS — E872 Acidosis: Secondary | ICD-10-CM | POA: Diagnosis not present

## 2021-02-07 DIAGNOSIS — I350 Nonrheumatic aortic (valve) stenosis: Secondary | ICD-10-CM | POA: Diagnosis not present

## 2021-02-07 DIAGNOSIS — E1122 Type 2 diabetes mellitus with diabetic chronic kidney disease: Secondary | ICD-10-CM | POA: Diagnosis not present

## 2021-02-07 DIAGNOSIS — Z794 Long term (current) use of insulin: Secondary | ICD-10-CM | POA: Diagnosis not present

## 2021-02-07 DIAGNOSIS — E1140 Type 2 diabetes mellitus with diabetic neuropathy, unspecified: Secondary | ICD-10-CM | POA: Diagnosis not present

## 2021-02-07 DIAGNOSIS — N183 Chronic kidney disease, stage 3 unspecified: Secondary | ICD-10-CM | POA: Diagnosis not present

## 2021-02-07 DIAGNOSIS — M5134 Other intervertebral disc degeneration, thoracic region: Secondary | ICD-10-CM | POA: Diagnosis not present

## 2021-02-07 DIAGNOSIS — Z4889 Encounter for other specified surgical aftercare: Secondary | ICD-10-CM | POA: Diagnosis not present

## 2021-02-07 DIAGNOSIS — N179 Acute kidney failure, unspecified: Secondary | ICD-10-CM | POA: Diagnosis not present

## 2021-02-07 DIAGNOSIS — M96840 Postprocedural hematoma of a musculoskeletal structure following a musculoskeletal system procedure: Secondary | ICD-10-CM | POA: Diagnosis not present

## 2021-02-07 DIAGNOSIS — R579 Shock, unspecified: Secondary | ICD-10-CM | POA: Diagnosis not present

## 2021-02-07 DIAGNOSIS — D62 Acute posthemorrhagic anemia: Secondary | ICD-10-CM | POA: Diagnosis not present

## 2021-02-08 DIAGNOSIS — E872 Acidosis: Secondary | ICD-10-CM | POA: Diagnosis not present

## 2021-02-08 DIAGNOSIS — D638 Anemia in other chronic diseases classified elsewhere: Secondary | ICD-10-CM | POA: Diagnosis not present

## 2021-02-08 DIAGNOSIS — M96840 Postprocedural hematoma of a musculoskeletal structure following a musculoskeletal system procedure: Secondary | ICD-10-CM | POA: Diagnosis not present

## 2021-02-08 DIAGNOSIS — N179 Acute kidney failure, unspecified: Secondary | ICD-10-CM | POA: Diagnosis not present

## 2021-02-08 DIAGNOSIS — E1140 Type 2 diabetes mellitus with diabetic neuropathy, unspecified: Secondary | ICD-10-CM | POA: Diagnosis not present

## 2021-02-08 DIAGNOSIS — Z9889 Other specified postprocedural states: Secondary | ICD-10-CM | POA: Diagnosis not present

## 2021-02-08 DIAGNOSIS — E1122 Type 2 diabetes mellitus with diabetic chronic kidney disease: Secondary | ICD-10-CM | POA: Diagnosis not present

## 2021-02-08 DIAGNOSIS — D649 Anemia, unspecified: Secondary | ICD-10-CM | POA: Diagnosis not present

## 2021-02-08 DIAGNOSIS — I350 Nonrheumatic aortic (valve) stenosis: Secondary | ICD-10-CM | POA: Diagnosis not present

## 2021-02-08 DIAGNOSIS — R579 Shock, unspecified: Secondary | ICD-10-CM | POA: Diagnosis not present

## 2021-02-08 DIAGNOSIS — D62 Acute posthemorrhagic anemia: Secondary | ICD-10-CM | POA: Diagnosis not present

## 2021-02-08 DIAGNOSIS — M5134 Other intervertebral disc degeneration, thoracic region: Secondary | ICD-10-CM | POA: Diagnosis not present

## 2021-02-08 DIAGNOSIS — N189 Chronic kidney disease, unspecified: Secondary | ICD-10-CM | POA: Diagnosis not present

## 2021-02-09 DIAGNOSIS — M5135 Other intervertebral disc degeneration, thoracolumbar region: Secondary | ICD-10-CM | POA: Diagnosis not present

## 2021-02-09 DIAGNOSIS — M6282 Rhabdomyolysis: Secondary | ICD-10-CM | POA: Diagnosis not present

## 2021-02-09 DIAGNOSIS — E872 Acidosis: Secondary | ICD-10-CM | POA: Diagnosis not present

## 2021-02-09 DIAGNOSIS — E1122 Type 2 diabetes mellitus with diabetic chronic kidney disease: Secondary | ICD-10-CM | POA: Diagnosis not present

## 2021-02-09 DIAGNOSIS — D62 Acute posthemorrhagic anemia: Secondary | ICD-10-CM | POA: Diagnosis not present

## 2021-02-09 DIAGNOSIS — I350 Nonrheumatic aortic (valve) stenosis: Secondary | ICD-10-CM | POA: Diagnosis not present

## 2021-02-09 DIAGNOSIS — N189 Chronic kidney disease, unspecified: Secondary | ICD-10-CM | POA: Diagnosis not present

## 2021-02-09 DIAGNOSIS — D649 Anemia, unspecified: Secondary | ICD-10-CM | POA: Diagnosis not present

## 2021-02-09 DIAGNOSIS — R579 Shock, unspecified: Secondary | ICD-10-CM | POA: Diagnosis not present

## 2021-02-09 DIAGNOSIS — N179 Acute kidney failure, unspecified: Secondary | ICD-10-CM | POA: Diagnosis not present

## 2021-02-09 DIAGNOSIS — G9519 Other vascular myelopathies: Secondary | ICD-10-CM | POA: Diagnosis not present

## 2021-02-09 DIAGNOSIS — E1140 Type 2 diabetes mellitus with diabetic neuropathy, unspecified: Secondary | ICD-10-CM | POA: Diagnosis not present

## 2021-02-09 DIAGNOSIS — D638 Anemia in other chronic diseases classified elsewhere: Secondary | ICD-10-CM | POA: Diagnosis not present

## 2021-02-10 DIAGNOSIS — I350 Nonrheumatic aortic (valve) stenosis: Secondary | ICD-10-CM | POA: Diagnosis not present

## 2021-02-10 DIAGNOSIS — N189 Chronic kidney disease, unspecified: Secondary | ICD-10-CM | POA: Diagnosis not present

## 2021-02-10 DIAGNOSIS — D631 Anemia in chronic kidney disease: Secondary | ICD-10-CM | POA: Diagnosis not present

## 2021-02-10 DIAGNOSIS — D62 Acute posthemorrhagic anemia: Secondary | ICD-10-CM | POA: Diagnosis not present

## 2021-02-10 DIAGNOSIS — E1140 Type 2 diabetes mellitus with diabetic neuropathy, unspecified: Secondary | ICD-10-CM | POA: Diagnosis not present

## 2021-02-10 DIAGNOSIS — D649 Anemia, unspecified: Secondary | ICD-10-CM | POA: Diagnosis not present

## 2021-02-10 DIAGNOSIS — I621 Nontraumatic extradural hemorrhage: Secondary | ICD-10-CM | POA: Diagnosis not present

## 2021-02-10 DIAGNOSIS — E872 Acidosis: Secondary | ICD-10-CM | POA: Diagnosis not present

## 2021-02-10 DIAGNOSIS — R579 Shock, unspecified: Secondary | ICD-10-CM | POA: Diagnosis not present

## 2021-02-10 DIAGNOSIS — N179 Acute kidney failure, unspecified: Secondary | ICD-10-CM | POA: Diagnosis not present

## 2021-02-10 DIAGNOSIS — E1122 Type 2 diabetes mellitus with diabetic chronic kidney disease: Secondary | ICD-10-CM | POA: Diagnosis not present

## 2021-02-10 DIAGNOSIS — M5136 Other intervertebral disc degeneration, lumbar region: Secondary | ICD-10-CM | POA: Diagnosis not present

## 2021-02-10 DIAGNOSIS — M6282 Rhabdomyolysis: Secondary | ICD-10-CM | POA: Diagnosis not present

## 2021-02-11 ENCOUNTER — Telehealth: Payer: Self-pay

## 2021-02-11 DIAGNOSIS — I959 Hypotension, unspecified: Secondary | ICD-10-CM | POA: Diagnosis not present

## 2021-02-11 DIAGNOSIS — R279 Unspecified lack of coordination: Secondary | ICD-10-CM | POA: Diagnosis not present

## 2021-02-11 DIAGNOSIS — M549 Dorsalgia, unspecified: Secondary | ICD-10-CM | POA: Diagnosis not present

## 2021-02-11 DIAGNOSIS — E1140 Type 2 diabetes mellitus with diabetic neuropathy, unspecified: Secondary | ICD-10-CM | POA: Diagnosis not present

## 2021-02-11 DIAGNOSIS — Z743 Need for continuous supervision: Secondary | ICD-10-CM | POA: Diagnosis not present

## 2021-02-11 DIAGNOSIS — I350 Nonrheumatic aortic (valve) stenosis: Secondary | ICD-10-CM | POA: Diagnosis not present

## 2021-02-11 DIAGNOSIS — E1165 Type 2 diabetes mellitus with hyperglycemia: Secondary | ICD-10-CM | POA: Diagnosis not present

## 2021-02-11 DIAGNOSIS — R5381 Other malaise: Secondary | ICD-10-CM | POA: Diagnosis not present

## 2021-02-11 DIAGNOSIS — R571 Hypovolemic shock: Secondary | ICD-10-CM | POA: Diagnosis not present

## 2021-02-11 DIAGNOSIS — G8929 Other chronic pain: Secondary | ICD-10-CM | POA: Diagnosis not present

## 2021-02-11 DIAGNOSIS — D649 Anemia, unspecified: Secondary | ICD-10-CM | POA: Diagnosis not present

## 2021-02-11 DIAGNOSIS — N179 Acute kidney failure, unspecified: Secondary | ICD-10-CM | POA: Diagnosis not present

## 2021-02-11 DIAGNOSIS — N183 Chronic kidney disease, stage 3 unspecified: Secondary | ICD-10-CM | POA: Diagnosis not present

## 2021-02-11 NOTE — Telephone Encounter (Signed)
LMOM for patient

## 2021-02-11 NOTE — Telephone Encounter (Signed)
Tommy Green called stating Tommy Green had back surgery and is being transferred to Tommy Green for rehab today.  Tommy Green states he had to stop his Enbrel and Leflunomide 2 weeks prior to the surgery and was told by the Orthopedic surgeon to contact Dr. Estanislado Pandy to find out when he needs to restart.  The Orthopedic surgeon told her every Rheumatology doctor is different and it will be up to her to decide when to restart his RA medications.  Tommy Green states she will be away from her phone today since [name] is being transferred and requested a message be left on her answering machine to let her know if he can restart medication.

## 2021-02-11 NOTE — Telephone Encounter (Signed)
He will require clearance by his surgeon to resume Enbrel and Regino Ramirez.

## 2021-02-13 DIAGNOSIS — I959 Hypotension, unspecified: Secondary | ICD-10-CM | POA: Diagnosis not present

## 2021-02-13 DIAGNOSIS — M5136 Other intervertebral disc degeneration, lumbar region: Secondary | ICD-10-CM | POA: Diagnosis not present

## 2021-02-13 DIAGNOSIS — E1142 Type 2 diabetes mellitus with diabetic polyneuropathy: Secondary | ICD-10-CM | POA: Diagnosis not present

## 2021-02-13 DIAGNOSIS — Z794 Long term (current) use of insulin: Secondary | ICD-10-CM | POA: Diagnosis not present

## 2021-02-13 DIAGNOSIS — Z981 Arthrodesis status: Secondary | ICD-10-CM | POA: Diagnosis not present

## 2021-02-13 DIAGNOSIS — Z7409 Other reduced mobility: Secondary | ICD-10-CM | POA: Diagnosis not present

## 2021-02-13 DIAGNOSIS — N170 Acute kidney failure with tubular necrosis: Secondary | ICD-10-CM | POA: Diagnosis not present

## 2021-02-15 DIAGNOSIS — K219 Gastro-esophageal reflux disease without esophagitis: Secondary | ICD-10-CM | POA: Diagnosis not present

## 2021-02-15 DIAGNOSIS — E1165 Type 2 diabetes mellitus with hyperglycemia: Secondary | ICD-10-CM | POA: Diagnosis not present

## 2021-02-15 DIAGNOSIS — Z981 Arthrodesis status: Secondary | ICD-10-CM | POA: Diagnosis not present

## 2021-02-15 DIAGNOSIS — M48062 Spinal stenosis, lumbar region with neurogenic claudication: Secondary | ICD-10-CM | POA: Diagnosis not present

## 2021-02-15 DIAGNOSIS — R059 Cough, unspecified: Secondary | ICD-10-CM | POA: Diagnosis not present

## 2021-02-15 DIAGNOSIS — E1140 Type 2 diabetes mellitus with diabetic neuropathy, unspecified: Secondary | ICD-10-CM | POA: Diagnosis not present

## 2021-02-15 DIAGNOSIS — D72829 Elevated white blood cell count, unspecified: Secondary | ICD-10-CM | POA: Diagnosis not present

## 2021-02-15 DIAGNOSIS — R339 Retention of urine, unspecified: Secondary | ICD-10-CM | POA: Diagnosis not present

## 2021-02-15 DIAGNOSIS — M069 Rheumatoid arthritis, unspecified: Secondary | ICD-10-CM | POA: Diagnosis not present

## 2021-02-15 DIAGNOSIS — R197 Diarrhea, unspecified: Secondary | ICD-10-CM | POA: Diagnosis not present

## 2021-02-15 DIAGNOSIS — Z4789 Encounter for other orthopedic aftercare: Secondary | ICD-10-CM | POA: Diagnosis not present

## 2021-02-15 DIAGNOSIS — E1122 Type 2 diabetes mellitus with diabetic chronic kidney disease: Secondary | ICD-10-CM | POA: Diagnosis not present

## 2021-02-15 DIAGNOSIS — I350 Nonrheumatic aortic (valve) stenosis: Secondary | ICD-10-CM | POA: Diagnosis not present

## 2021-02-15 DIAGNOSIS — R52 Pain, unspecified: Secondary | ICD-10-CM | POA: Diagnosis not present

## 2021-02-15 DIAGNOSIS — N183 Chronic kidney disease, stage 3 unspecified: Secondary | ICD-10-CM | POA: Diagnosis not present

## 2021-02-15 DIAGNOSIS — R110 Nausea: Secondary | ICD-10-CM | POA: Diagnosis not present

## 2021-02-19 DIAGNOSIS — R197 Diarrhea, unspecified: Secondary | ICD-10-CM | POA: Diagnosis not present

## 2021-02-19 DIAGNOSIS — D72829 Elevated white blood cell count, unspecified: Secondary | ICD-10-CM | POA: Diagnosis not present

## 2021-02-19 DIAGNOSIS — R110 Nausea: Secondary | ICD-10-CM | POA: Diagnosis not present

## 2021-02-19 DIAGNOSIS — R52 Pain, unspecified: Secondary | ICD-10-CM | POA: Diagnosis not present

## 2021-02-19 DIAGNOSIS — R059 Cough, unspecified: Secondary | ICD-10-CM | POA: Diagnosis not present

## 2021-02-19 DIAGNOSIS — K219 Gastro-esophageal reflux disease without esophagitis: Secondary | ICD-10-CM | POA: Diagnosis not present

## 2021-02-20 ENCOUNTER — Other Ambulatory Visit: Payer: Self-pay | Admitting: Family Medicine

## 2021-03-03 DIAGNOSIS — M48062 Spinal stenosis, lumbar region with neurogenic claudication: Secondary | ICD-10-CM | POA: Diagnosis not present

## 2021-03-05 ENCOUNTER — Inpatient Hospital Stay (HOSPITAL_COMMUNITY)
Admission: EM | Admit: 2021-03-05 | Discharge: 2021-03-10 | DRG: 092 | Disposition: A | Discharge status: Skilled Nursing Facility | Payer: Medicare HMO | Source: Skilled Nursing Facility | Attending: Internal Medicine | Admitting: Internal Medicine

## 2021-03-05 ENCOUNTER — Encounter (HOSPITAL_COMMUNITY): Payer: Self-pay

## 2021-03-05 ENCOUNTER — Observation Stay (HOSPITAL_COMMUNITY): Payer: Medicare HMO

## 2021-03-05 ENCOUNTER — Emergency Department (HOSPITAL_COMMUNITY): Payer: Medicare HMO

## 2021-03-05 DIAGNOSIS — M109 Gout, unspecified: Secondary | ICD-10-CM | POA: Diagnosis present

## 2021-03-05 DIAGNOSIS — M069 Rheumatoid arthritis, unspecified: Secondary | ICD-10-CM | POA: Diagnosis present

## 2021-03-05 DIAGNOSIS — Z79899 Other long term (current) drug therapy: Secondary | ICD-10-CM

## 2021-03-05 DIAGNOSIS — I959 Hypotension, unspecified: Secondary | ICD-10-CM | POA: Diagnosis not present

## 2021-03-05 DIAGNOSIS — I10 Essential (primary) hypertension: Secondary | ICD-10-CM | POA: Diagnosis not present

## 2021-03-05 DIAGNOSIS — R29810 Facial weakness: Secondary | ICD-10-CM | POA: Diagnosis not present

## 2021-03-05 DIAGNOSIS — Z87891 Personal history of nicotine dependence: Secondary | ICD-10-CM

## 2021-03-05 DIAGNOSIS — Z7984 Long term (current) use of oral hypoglycemic drugs: Secondary | ICD-10-CM

## 2021-03-05 DIAGNOSIS — N179 Acute kidney failure, unspecified: Secondary | ICD-10-CM | POA: Diagnosis not present

## 2021-03-05 DIAGNOSIS — D649 Anemia, unspecified: Secondary | ICD-10-CM | POA: Diagnosis not present

## 2021-03-05 DIAGNOSIS — Z811 Family history of alcohol abuse and dependence: Secondary | ICD-10-CM

## 2021-03-05 DIAGNOSIS — E11649 Type 2 diabetes mellitus with hypoglycemia without coma: Secondary | ICD-10-CM | POA: Diagnosis not present

## 2021-03-05 DIAGNOSIS — Z794 Long term (current) use of insulin: Secondary | ICD-10-CM

## 2021-03-05 DIAGNOSIS — E8881 Metabolic syndrome: Secondary | ICD-10-CM | POA: Diagnosis present

## 2021-03-05 DIAGNOSIS — N1832 Chronic kidney disease, stage 3b: Secondary | ICD-10-CM

## 2021-03-05 DIAGNOSIS — R4781 Slurred speech: Secondary | ICD-10-CM | POA: Diagnosis not present

## 2021-03-05 DIAGNOSIS — G459 Transient cerebral ischemic attack, unspecified: Secondary | ICD-10-CM | POA: Diagnosis not present

## 2021-03-05 DIAGNOSIS — T426X1A Poisoning by other antiepileptic and sedative-hypnotic drugs, accidental (unintentional), initial encounter: Secondary | ICD-10-CM | POA: Diagnosis present

## 2021-03-05 DIAGNOSIS — E1122 Type 2 diabetes mellitus with diabetic chronic kidney disease: Secondary | ICD-10-CM | POA: Diagnosis present

## 2021-03-05 DIAGNOSIS — N182 Chronic kidney disease, stage 2 (mild): Secondary | ICD-10-CM | POA: Diagnosis present

## 2021-03-05 DIAGNOSIS — I129 Hypertensive chronic kidney disease with stage 1 through stage 4 chronic kidney disease, or unspecified chronic kidney disease: Secondary | ICD-10-CM | POA: Diagnosis present

## 2021-03-05 DIAGNOSIS — E781 Pure hyperglyceridemia: Secondary | ICD-10-CM | POA: Diagnosis present

## 2021-03-05 DIAGNOSIS — T426X5A Adverse effect of other antiepileptic and sedative-hypnotic drugs, initial encounter: Secondary | ICD-10-CM | POA: Diagnosis present

## 2021-03-05 DIAGNOSIS — E1140 Type 2 diabetes mellitus with diabetic neuropathy, unspecified: Secondary | ICD-10-CM | POA: Diagnosis present

## 2021-03-05 DIAGNOSIS — K219 Gastro-esophageal reflux disease without esophagitis: Secondary | ICD-10-CM | POA: Diagnosis present

## 2021-03-05 DIAGNOSIS — E559 Vitamin D deficiency, unspecified: Secondary | ICD-10-CM | POA: Diagnosis present

## 2021-03-05 DIAGNOSIS — R4701 Aphasia: Secondary | ICD-10-CM | POA: Diagnosis not present

## 2021-03-05 DIAGNOSIS — Z981 Arthrodesis status: Secondary | ICD-10-CM

## 2021-03-05 DIAGNOSIS — R58 Hemorrhage, not elsewhere classified: Secondary | ICD-10-CM | POA: Diagnosis not present

## 2021-03-05 DIAGNOSIS — E875 Hyperkalemia: Secondary | ICD-10-CM | POA: Diagnosis present

## 2021-03-05 DIAGNOSIS — E860 Dehydration: Secondary | ICD-10-CM | POA: Diagnosis present

## 2021-03-05 DIAGNOSIS — Z743 Need for continuous supervision: Secondary | ICD-10-CM | POA: Diagnosis not present

## 2021-03-05 DIAGNOSIS — Z20822 Contact with and (suspected) exposure to covid-19: Secondary | ICD-10-CM | POA: Diagnosis present

## 2021-03-05 DIAGNOSIS — Z8673 Personal history of transient ischemic attack (TIA), and cerebral infarction without residual deficits: Secondary | ICD-10-CM

## 2021-03-05 LAB — CBC WITH DIFFERENTIAL/PLATELET
Abs Immature Granulocytes: 0.04 10*3/uL (ref 0.00–0.07)
Basophils Absolute: 0.1 10*3/uL (ref 0.0–0.1)
Basophils Relative: 1 %
Eosinophils Absolute: 0.4 10*3/uL (ref 0.0–0.5)
Eosinophils Relative: 5 %
HCT: 27.2 % — ABNORMAL LOW (ref 39.0–52.0)
Hemoglobin: 8.4 g/dL — ABNORMAL LOW (ref 13.0–17.0)
Immature Granulocytes: 1 %
Lymphocytes Relative: 22 %
Lymphs Abs: 1.8 10*3/uL (ref 0.7–4.0)
MCH: 26.4 pg (ref 26.0–34.0)
MCHC: 30.9 g/dL (ref 30.0–36.0)
MCV: 85.5 fL (ref 80.0–100.0)
Monocytes Absolute: 0.6 10*3/uL (ref 0.1–1.0)
Monocytes Relative: 8 %
Neutro Abs: 5.1 10*3/uL (ref 1.7–7.7)
Neutrophils Relative %: 63 %
Platelets: 311 10*3/uL (ref 150–400)
RBC: 3.18 MIL/uL — ABNORMAL LOW (ref 4.22–5.81)
RDW: 16.7 % — ABNORMAL HIGH (ref 11.5–15.5)
WBC: 8 10*3/uL (ref 4.0–10.5)
nRBC: 0 % (ref 0.0–0.2)

## 2021-03-05 LAB — BASIC METABOLIC PANEL
Anion gap: 8 (ref 5–15)
Anion gap: 5 (ref 5–15)
BUN: 42 mg/dL — ABNORMAL HIGH (ref 8–23)
BUN: 37 mg/dL — ABNORMAL HIGH (ref 8–23)
CO2: 22 mmol/L (ref 22–32)
CO2: 22 mmol/L (ref 22–32)
Calcium: 8.9 mg/dL (ref 8.9–10.3)
Calcium: 8.8 mg/dL — ABNORMAL LOW (ref 8.9–10.3)
Chloride: 106 mmol/L (ref 98–111)
Chloride: 107 mmol/L (ref 98–111)
Creatinine, Ser: 2.2 mg/dL — ABNORMAL HIGH (ref 0.61–1.24)
Creatinine, Ser: 1.97 mg/dL — ABNORMAL HIGH (ref 0.61–1.24)
GFR, Estimated: 30 mL/min — ABNORMAL LOW (ref >60)
GFR, Estimated: 34 mL/min — ABNORMAL LOW (ref >60)
Glucose, Bld: 90 mg/dL (ref 70–99)
Glucose, Bld: 129 mg/dL — ABNORMAL HIGH (ref 70–99)
Potassium: 5.8 mmol/L — ABNORMAL HIGH (ref 3.5–5.1)
Potassium: 5.3 mmol/L — ABNORMAL HIGH (ref 3.5–5.1)
Sodium: 136 mmol/L (ref 135–145)
Sodium: 134 mmol/L — ABNORMAL LOW (ref 135–145)

## 2021-03-05 LAB — URIC ACID: Uric Acid, Serum: 7.1 mg/dL (ref 3.7–8.6)

## 2021-03-05 LAB — URINALYSIS, ROUTINE W REFLEX MICROSCOPIC
Bilirubin Urine: NEGATIVE
Glucose, UA: NEGATIVE mg/dL
Hgb urine dipstick: NEGATIVE
Ketones, ur: NEGATIVE mg/dL
Leukocytes,Ua: NEGATIVE
Nitrite: NEGATIVE
Protein, ur: NEGATIVE mg/dL
Specific Gravity, Urine: 1.008 (ref 1.005–1.030)
pH: 7 (ref 5.0–8.0)

## 2021-03-05 LAB — MAGNESIUM: Magnesium: 1.9 mg/dL (ref 1.7–2.4)

## 2021-03-05 LAB — RETICULOCYTES
Immature Retic Fract: 13.5 % (ref 2.3–15.9)
RBC.: 3.15 MIL/uL — ABNORMAL LOW (ref 4.22–5.81)
Retic Count, Absolute: 43.8 10*3/uL (ref 19.0–186.0)
Retic Ct Pct: 1.4 % (ref 0.4–3.1)

## 2021-03-05 LAB — SARS CORONAVIRUS 2 (TAT 6-24 HRS): SARS Coronavirus 2: NEGATIVE

## 2021-03-05 LAB — GLUCOSE, CAPILLARY: Glucose-Capillary: 103 mg/dL — ABNORMAL HIGH (ref 70–99)

## 2021-03-05 LAB — PROTIME-INR
INR: 1.1 (ref 0.8–1.2)
Prothrombin Time: 14.5 seconds (ref 11.4–15.2)

## 2021-03-05 LAB — CBG MONITORING, ED: Glucose-Capillary: 137 mg/dL — ABNORMAL HIGH (ref 70–99)

## 2021-03-05 LAB — HEMOGLOBIN A1C
Hgb A1c MFr Bld: 7.3 % — ABNORMAL HIGH (ref 4.8–5.6)
Mean Plasma Glucose: 162.81 mg/dL

## 2021-03-05 IMAGING — CT CT HEAD W/O CM
4 series · 15 of 47 positions shown, 17 images · non-contrast
Comparison: [DATE]

CLINICAL DATA: Hand tremors and slurred speech

EXAM:
CT HEAD WITHOUT CONTRAST
TECHNIQUE: Contiguous axial images were obtained from the base of the skull
through the vertex without intravenous contrast.

[Series 3: head without · axial · non-contrast · 0.50mm/px · z∈[+507,+627]mm · 7 of 34 slices shown, 9 images]
[im 5/34  brain]
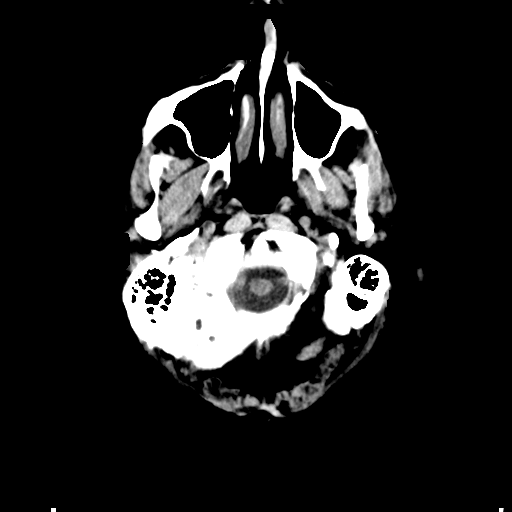
[im 5/34  bone]
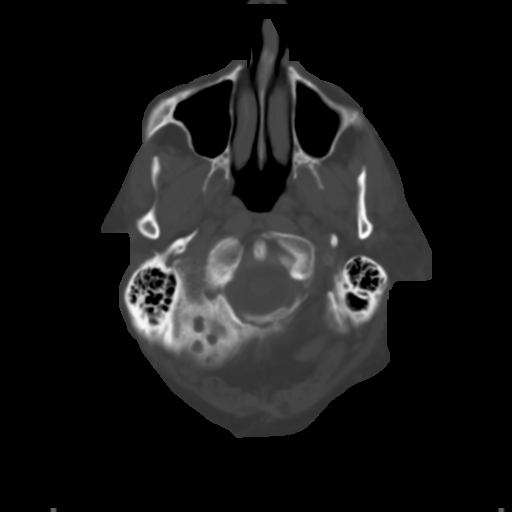
[im 9/34  brain]
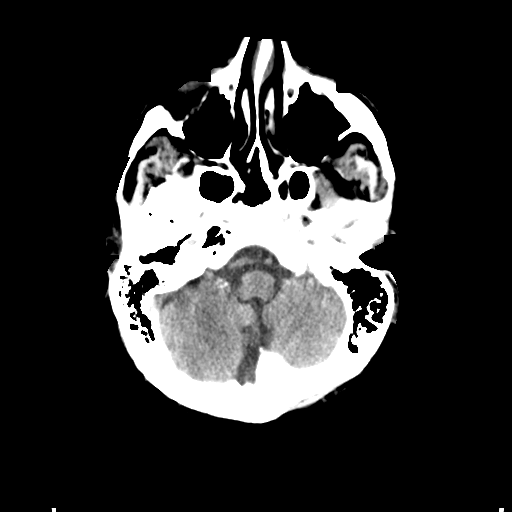
[im 13/34  brain]
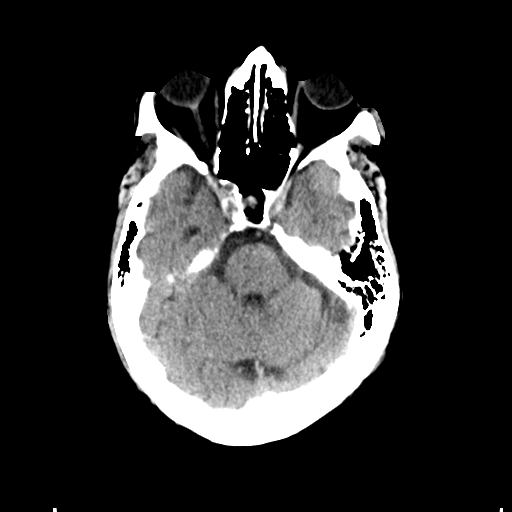
[im 17/34  brain]
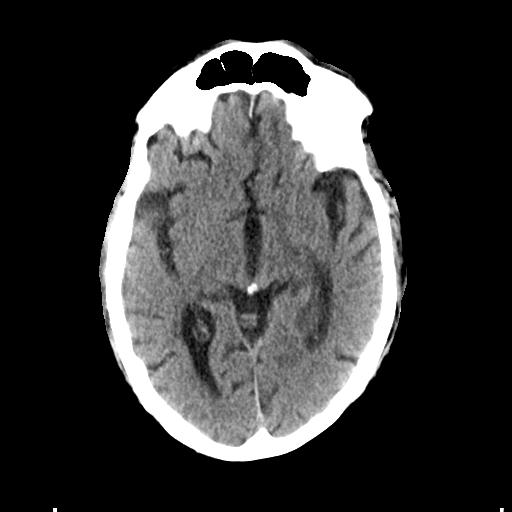
[im 21/34  brain]
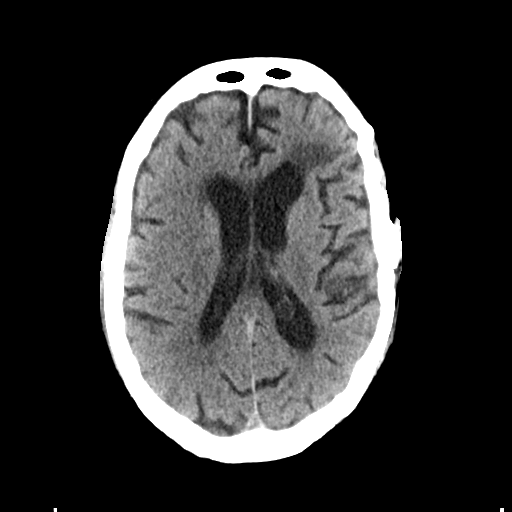
[im 21/34  bone]
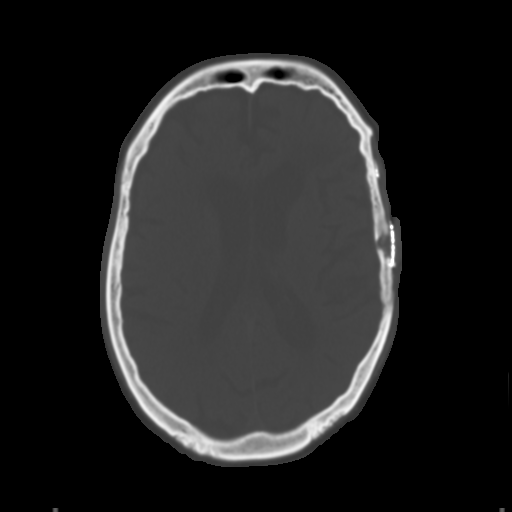
[im 25/34  brain]
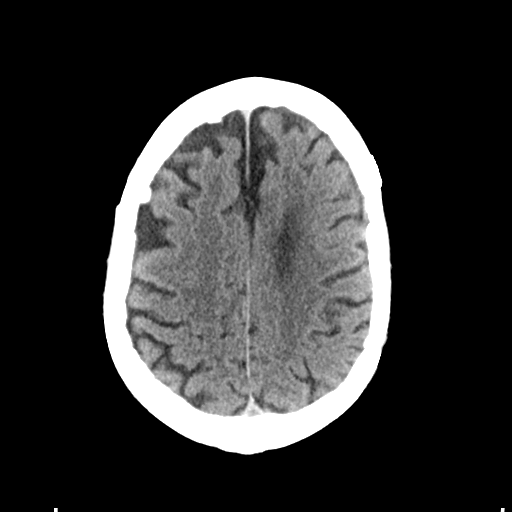
[im 29/34  brain]
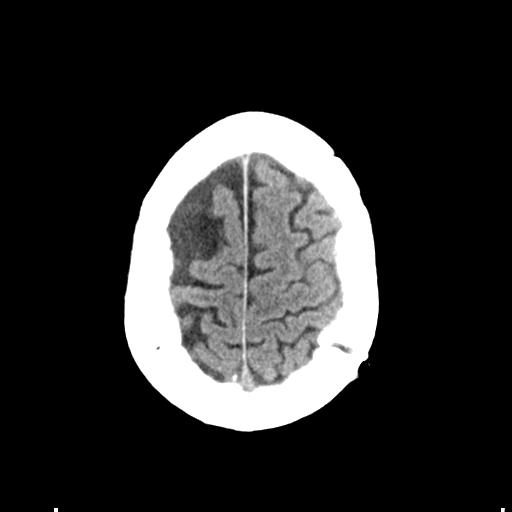

[Series 4: head bone · axial · 0.50mm/px · z∈[+503,+519]mm · 2 of 85 slices shown]
[im 9/85  bone]
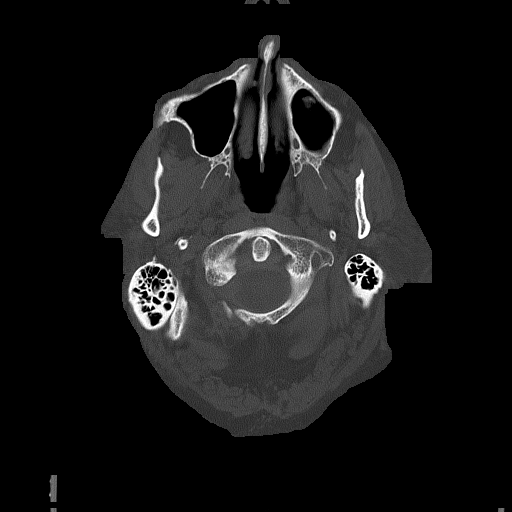
[im 17/85  bone]
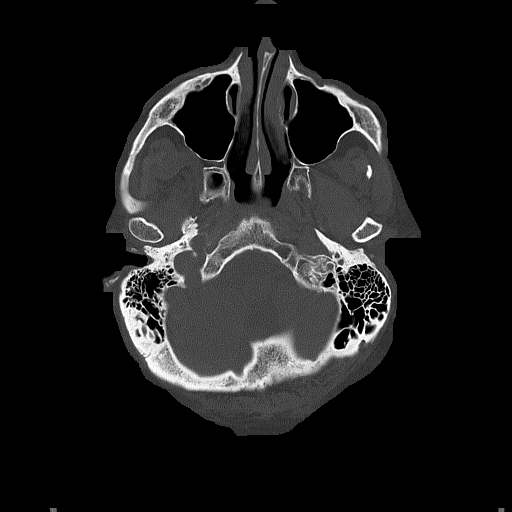

[Series 5: head without cor · coronal · non-contrast · 0.37mm/px · 3 of 83 slices shown]
[im 28/83  brain]
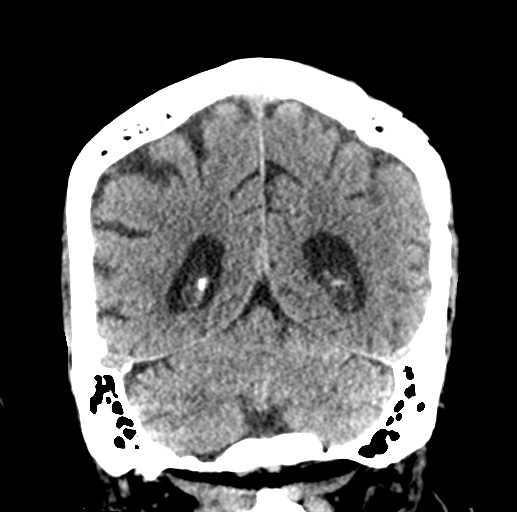
[im 37/83  brain]
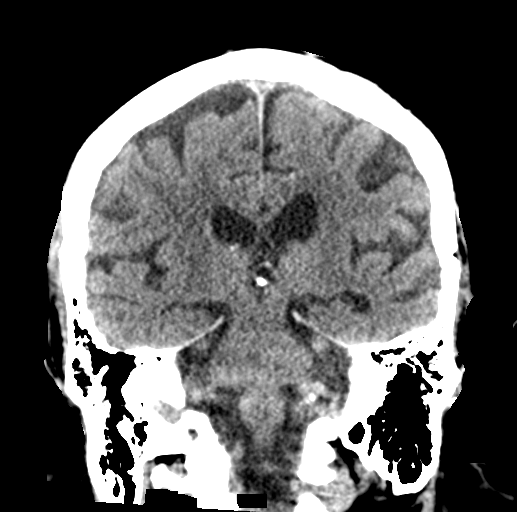
[im 46/83  brain]
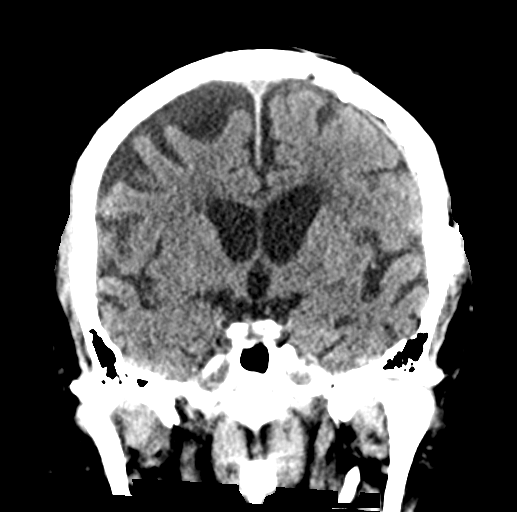

[Series 6: head without sag · sagittal · non-contrast · 0.38mm/px · 3 of 67 slices shown]
[im 23/67  brain]
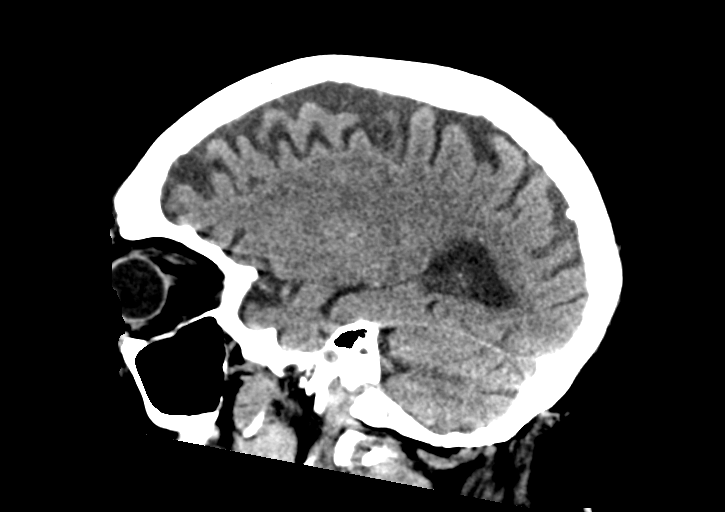
[im 34/67  brain]
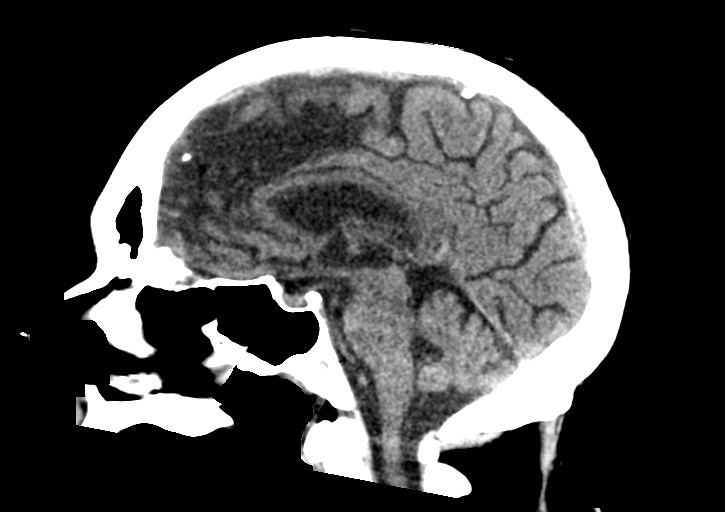
[im 45/67  brain]
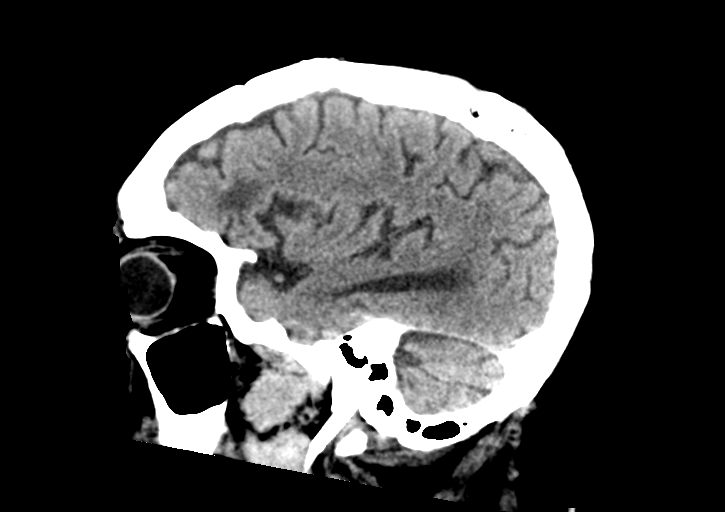

[15 of 47 positions shown; findings below may reference images not displayed]

FINDINGS: Brain: There is mild diffuse atrophy with asymmetric atrophy in the
right frontal and parietal regions compared to the left side, a
finding stable from prior study. There is no intracranial mass,
hemorrhage, extra-axial fluid collection, or midline shift. There
remains a linear dural area of increased opacity at the level of the
craniotomy, likely due to previous subdural hematoma with repair and
potential postoperative thickening. This appearance is stable
compared to the prior study. There is not felt to be recurrence of
subdural hematoma in this area based on the appearance of the prior
study. There is evidence of a prior infarct in the left frontal lobe
anterior and lateral to the anterior horn of the left lateral
ventricle. Patchy periventricular small vessel disease also noted.
No acute appearing infarct evident.

Vascular: No appreciable hyperdense vessel. Calcification noted in
the carotid siphon regions.

Skull: Evidence of previous left frontal and parietal craniotomy,
unchanged. No new bone lesions.

Sinuses/Orbits: Mucosal thickening in several ethmoid air cells
noted. There is also mild mucosal thickening in the inferior left
maxillary antrum. Orbits appear symmetric bilaterally.

Other: Mastoid air cells clear.
IMPRESSION: Asymmetric atrophy right frontal and parietal lobes compared to the
left side, stable. Dural thickening on the left is stable compared
to prior study, likely due to scarring/repair of dura at time of
craniotomy on the left. No acute appearing extra-axial fluid
collection evident. No mass or hemorrhage. There is evidence of a
prior infarct in the left frontal lobe as well as patchy
periventricular small vessel disease. No acute infarct evident.

There are foci of arterial vascular calcification. Mucosal
thickening noted in several paranasal sinus regions.

## 2021-03-05 IMAGING — US US RENAL
1 series · 14 of 25 positions shown · non-contrast
Comparison: None.

CLINICAL DATA: Acute kidney injury.

EXAM:
RENAL / URINARY TRACT ULTRASOUND COMPLETE

[Series 1: us renal · 14 of 36 slices shown]
[im 1/36]
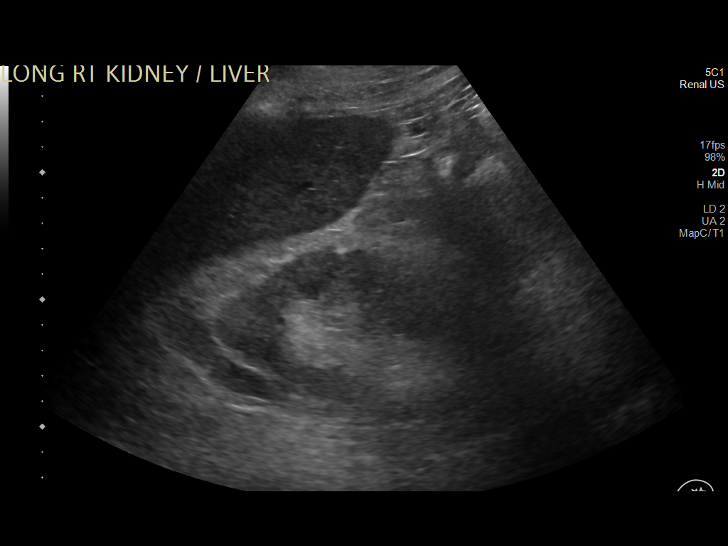
[im 3/36]
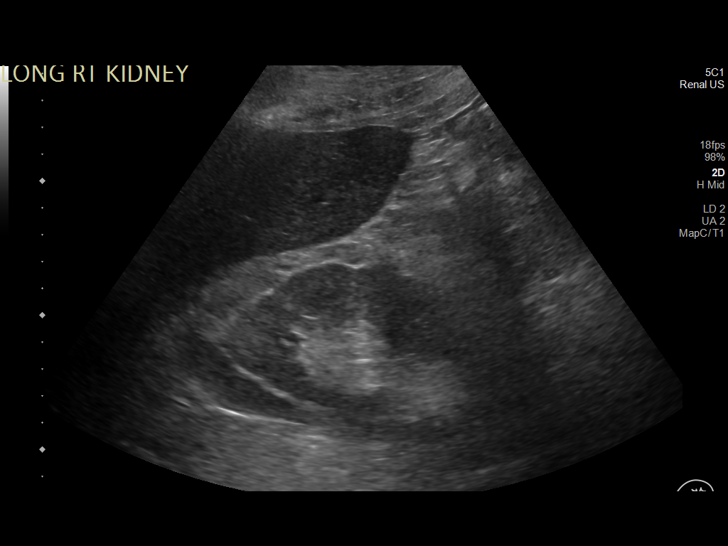
[im 6/36]
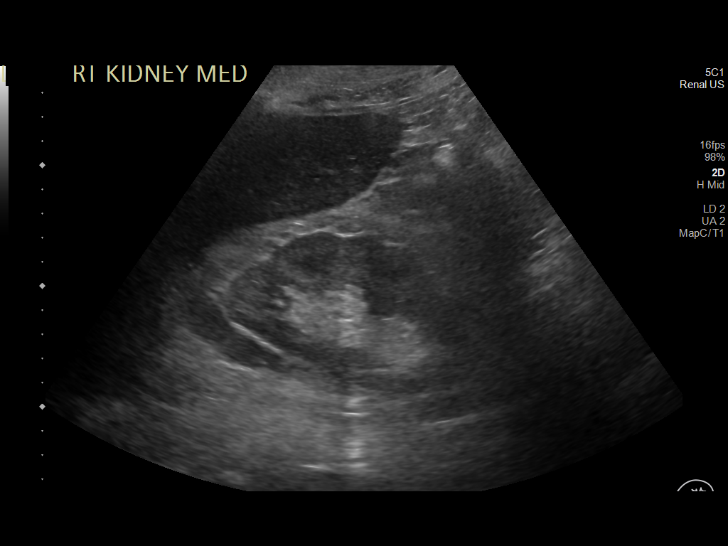
[im 9/36]
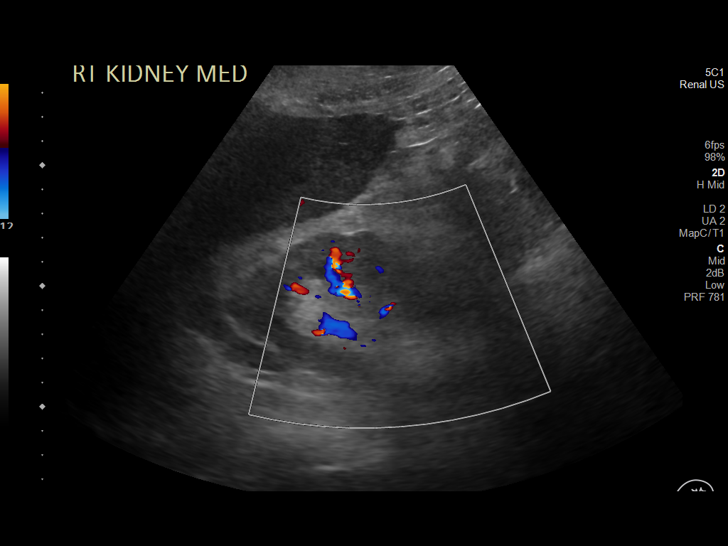
[im 12/36]
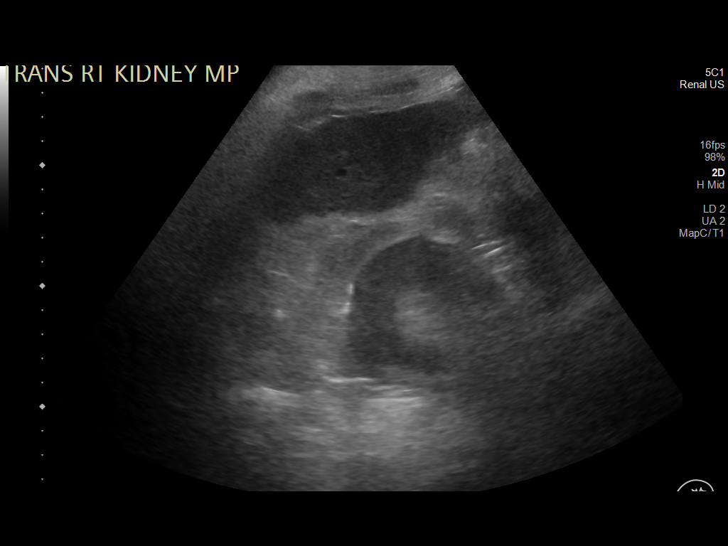
[im 14/36]
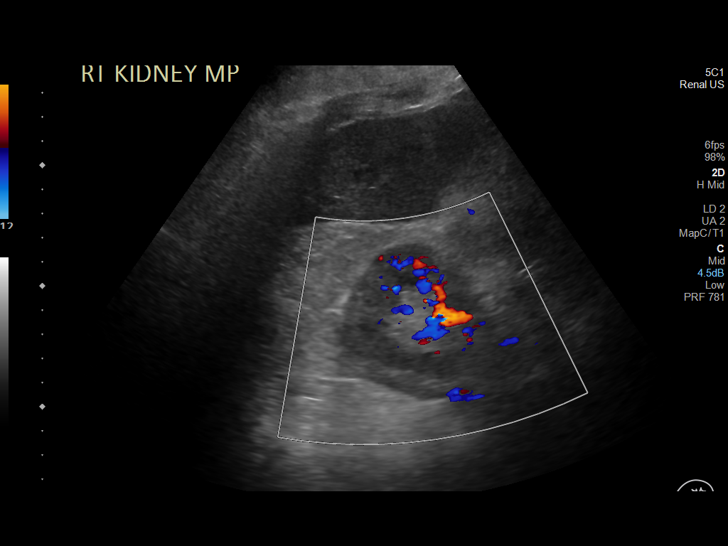
[im 17/36]
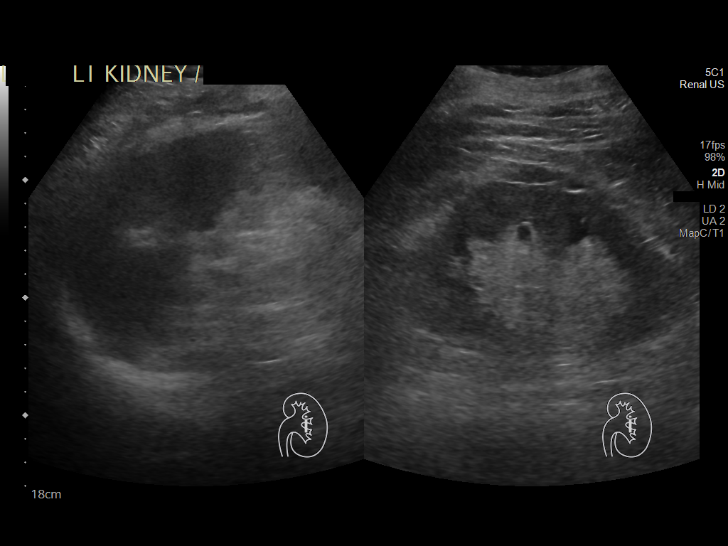
[im 19/36]
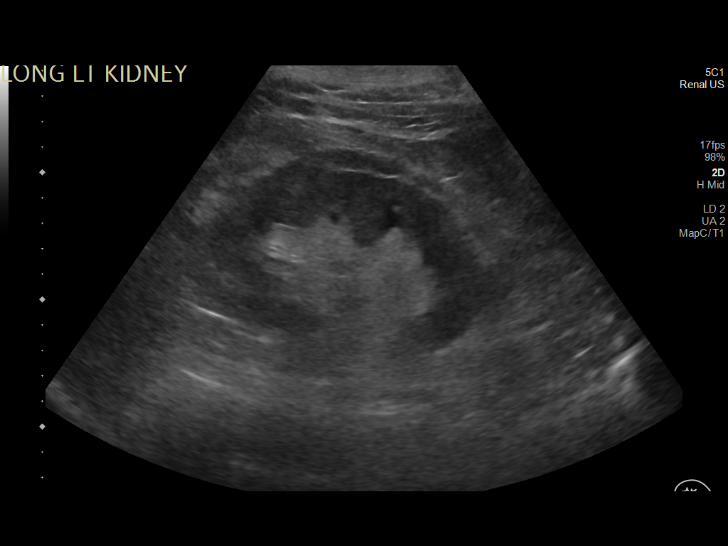
[im 22/36]
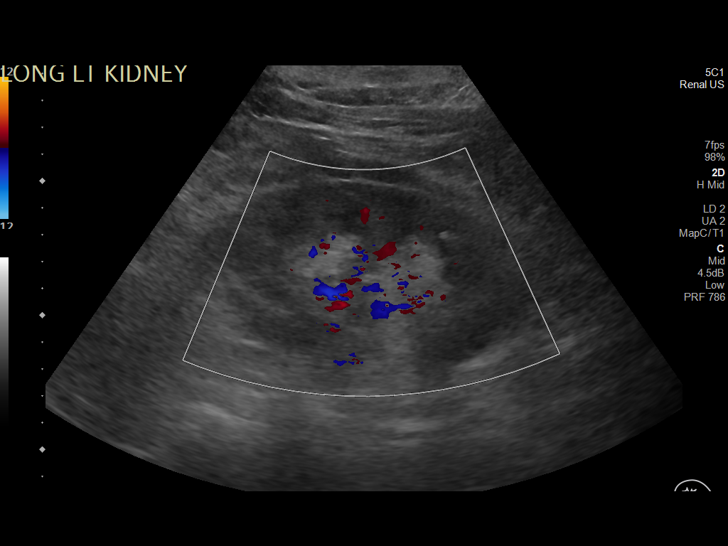
[im 24/36]
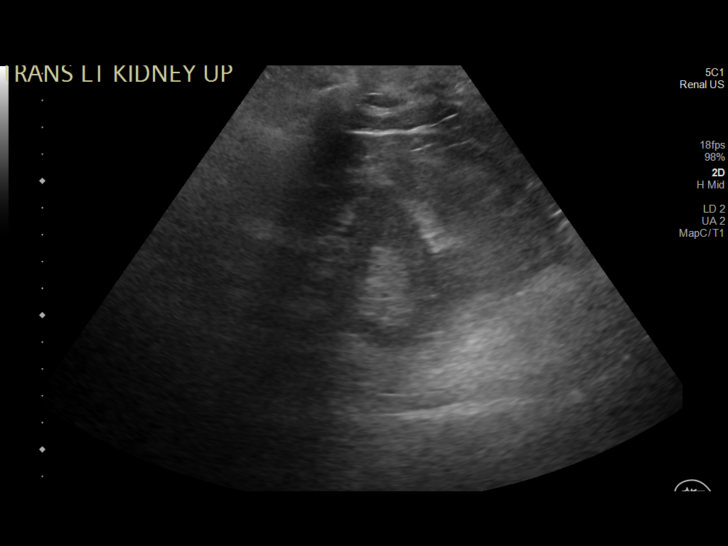
[im 27/36]
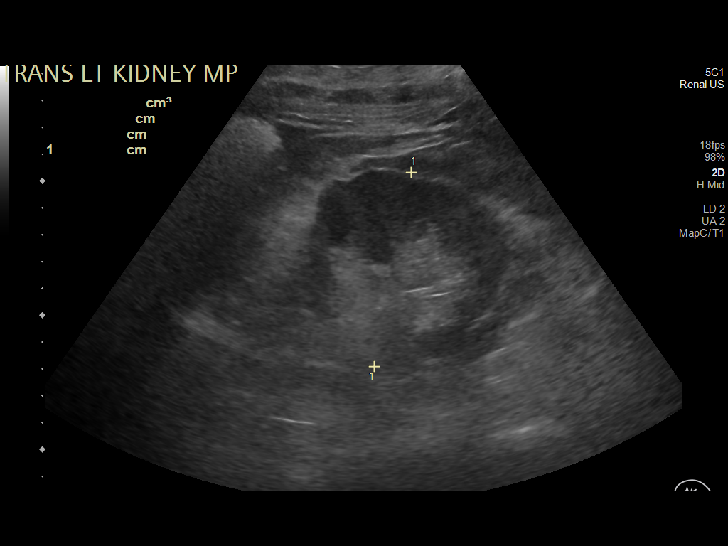
[im 30/36]
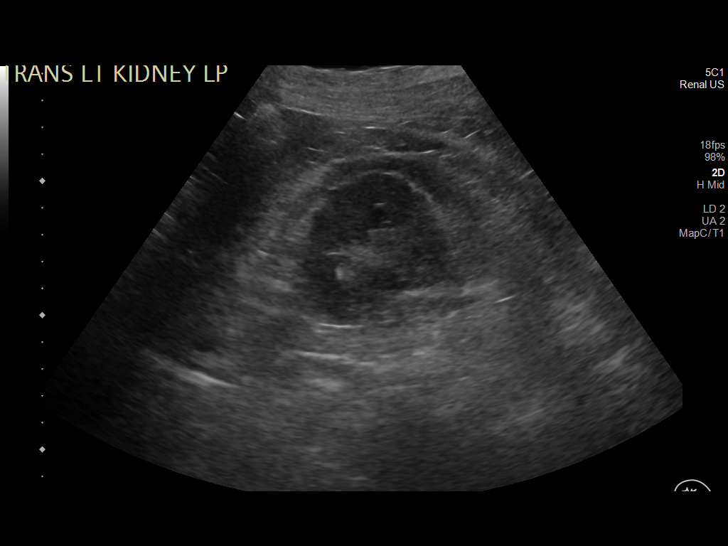
[im 33/36]
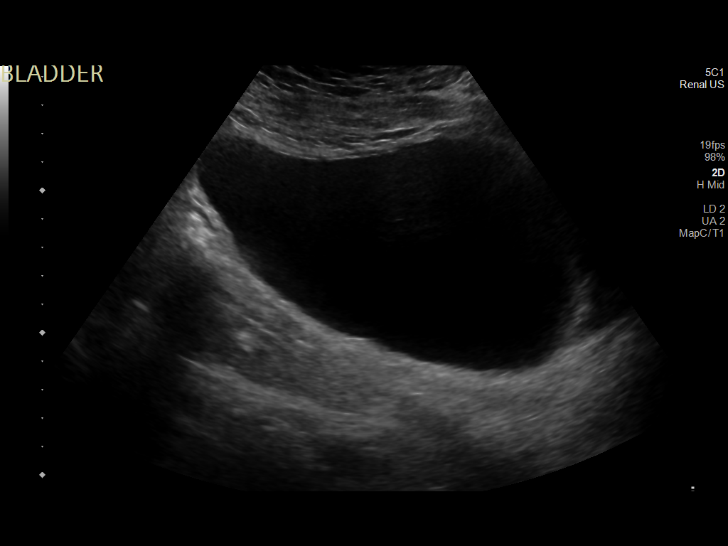
[im 36/36]
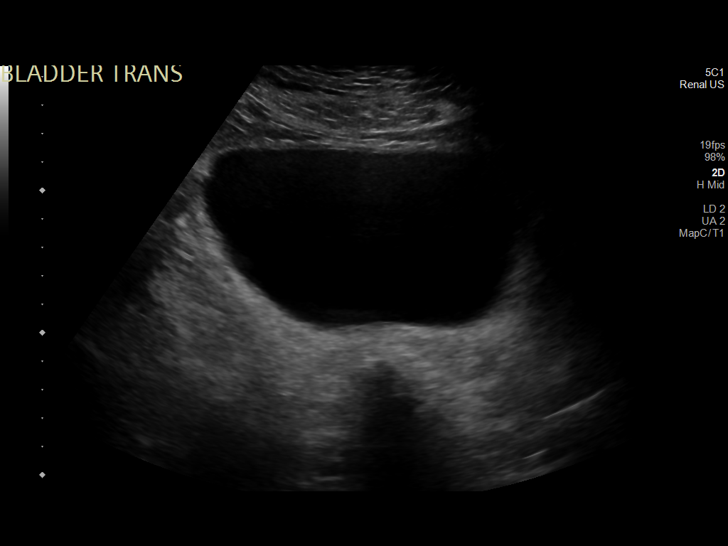

[14 of 25 positions shown; findings below may reference images not displayed]

FINDINGS: Right Kidney:

Renal measurements: 11.0 cm x 6.7 cm x 5.3 cm = volume: 202.4 mL.
Echogenicity within normal limits. No mass or hydronephrosis
visualized.

Left Kidney:

Renal measurements: 12.7 cm x 7.0 cm x 7.4 cm = volume: 341.6 mL.
Echogenicity within normal limits. No mass or hydronephrosis
visualized.

Bladder:

Appears normal for degree of bladder distention.

Other:

None.
IMPRESSION: Normal renal ultrasound.

## 2021-03-05 MED ORDER — B COMPLEX VITAMINS PO CAPS: 1.0000 | ORAL_CAPSULE | Freq: Every day | ORAL | Status: DC

## 2021-03-05 MED ORDER — INSULIN GLARGINE 100 UNIT/ML ~~LOC~~ SOLN
40.0000 [IU] | Freq: Every day | SUBCUTANEOUS | Status: DC
  Administered 2021-03-05 – 2021-03-08 (×4): 40 [IU] via SUBCUTANEOUS
  Filled 2021-03-05 (×6): qty 0.4

## 2021-03-05 MED ORDER — AMLODIPINE BESYLATE 5 MG PO TABS
5.0000 mg | ORAL_TABLET | Freq: Every day | ORAL | Status: DC
  Administered 2021-03-05 – 2021-03-10 (×6): 5 mg via ORAL
  Filled 2021-03-05 (×6): qty 1

## 2021-03-05 MED ORDER — INSULIN ASPART 100 UNIT/ML IJ SOLN
0.0000 [IU] | Freq: Three times a day (TID) | INTRAMUSCULAR | Status: DC
  Administered 2021-03-05 – 2021-03-06 (×2): 1 [IU] via SUBCUTANEOUS
  Administered 2021-03-06: 2 [IU] via SUBCUTANEOUS
  Administered 2021-03-07: 1 [IU] via SUBCUTANEOUS
  Administered 2021-03-07: 2 [IU] via SUBCUTANEOUS
  Administered 2021-03-09 (×2): 1 [IU] via SUBCUTANEOUS
  Administered 2021-03-10: 2 [IU] via SUBCUTANEOUS
  Administered 2021-03-10: 1 [IU] via SUBCUTANEOUS

## 2021-03-05 MED ORDER — HEPARIN SODIUM (PORCINE) 5000 UNIT/ML IJ SOLN
5000.0000 [IU] | Freq: Two times a day (BID) | INTRAMUSCULAR | Status: DC
  Administered 2021-03-05 – 2021-03-10 (×11): 5000 [IU] via SUBCUTANEOUS
  Filled 2021-03-05 (×11): qty 1

## 2021-03-05 MED ORDER — LACTATED RINGERS IV BOLUS
500.0000 mL | Freq: Once | INTRAVENOUS | Status: AC
  Administered 2021-03-05: 500 mL via INTRAVENOUS

## 2021-03-05 MED ORDER — PRAVASTATIN SODIUM 10 MG PO TABS
10.0000 mg | ORAL_TABLET | Freq: Every day | ORAL | Status: DC
  Administered 2021-03-05 – 2021-03-09 (×5): 10 mg via ORAL
  Filled 2021-03-05 (×5): qty 1

## 2021-03-05 MED ORDER — ACETAMINOPHEN 325 MG PO TABS
650.0000 mg | ORAL_TABLET | ORAL | Status: DC | PRN
  Administered 2021-03-06: 650 mg via ORAL
  Filled 2021-03-05: qty 2

## 2021-03-05 MED ORDER — SODIUM ZIRCONIUM CYCLOSILICATE 10 G PO PACK
10.0000 g | PACK | Freq: Once | ORAL | Status: AC
  Administered 2021-03-05: 10 g via ORAL
  Filled 2021-03-05: qty 1

## 2021-03-05 MED ORDER — PANTOPRAZOLE SODIUM 40 MG PO TBEC
40.0000 mg | DELAYED_RELEASE_TABLET | Freq: Every day | ORAL | Status: DC
  Administered 2021-03-05 – 2021-03-10 (×6): 40 mg via ORAL
  Filled 2021-03-05 (×6): qty 1

## 2021-03-05 MED ORDER — CARVEDILOL 12.5 MG PO TABS
12.5000 mg | ORAL_TABLET | Freq: Two times a day (BID) | ORAL | Status: DC
  Administered 2021-03-05 – 2021-03-10 (×10): 12.5 mg via ORAL
  Filled 2021-03-05 (×10): qty 1

## 2021-03-05 MED ORDER — GABAPENTIN 300 MG PO CAPS
300.0000 mg | ORAL_CAPSULE | Freq: Every day | ORAL | Status: DC
  Administered 2021-03-05: 300 mg via ORAL
  Filled 2021-03-05: qty 1

## 2021-03-05 MED ORDER — ACETAMINOPHEN 650 MG RE SUPP: 650.0000 mg | RECTAL | Status: DC | PRN

## 2021-03-05 MED ORDER — LORATADINE 10 MG PO TABS
10.0000 mg | ORAL_TABLET | Freq: Every day | ORAL | Status: DC
  Administered 2021-03-05 – 2021-03-10 (×6): 10 mg via ORAL
  Filled 2021-03-05 (×7): qty 1

## 2021-03-05 MED ORDER — LEFLUNOMIDE 20 MG PO TABS
10.0000 mg | ORAL_TABLET | Freq: Every day | ORAL | Status: DC
  Administered 2021-03-05 – 2021-03-10 (×6): 10 mg via ORAL
  Filled 2021-03-05 (×6): qty 0.5

## 2021-03-05 MED ORDER — FENOFIBRATE 160 MG PO TABS
160.0000 mg | ORAL_TABLET | Freq: Every day | ORAL | Status: DC
  Administered 2021-03-05 – 2021-03-10 (×6): 160 mg via ORAL
  Filled 2021-03-05 (×6): qty 1

## 2021-03-05 MED ORDER — LINAGLIPTIN 5 MG PO TABS
5.0000 mg | ORAL_TABLET | Freq: Every day | ORAL | Status: DC
Start: 2021-03-05 — End: 2021-03-10
  Administered 2021-03-05 – 2021-03-10 (×6): 5 mg via ORAL
  Filled 2021-03-05 (×6): qty 1

## 2021-03-05 MED ORDER — STROKE: EARLY STAGES OF RECOVERY BOOK
Freq: Once | Status: AC
  Filled 2021-03-05: qty 1

## 2021-03-05 MED ORDER — ACETAMINOPHEN 160 MG/5ML PO SOLN: 650.0000 mg | ORAL | Status: DC | PRN

## 2021-03-05 MED ORDER — SODIUM CHLORIDE 0.9 % IV SOLN: INTRAVENOUS | Status: AC

## 2021-03-05 NOTE — ED Notes (Signed)
Neurology at bedside.

## 2021-03-05 NOTE — ED Triage Notes (Signed)
In rehab for spinal fusion yesterday began with new onset tremors. Started yesterday with new intermittent aphasia currently no aphasia spouse states had it this morning and until 7am and staff call EMS

## 2021-03-05 NOTE — ED Notes (Signed)
Patient transported to Ultrasound 

## 2021-03-05 NOTE — H&P (Signed)
History and Physical    Azar South Urquidi RCV:[id number] DOB: Jul 28, 1946 DOA: 03/05/2021  PCP: [name], DO (Confirm with patient/family/NH records and if not entered, this has to be entered at [facility] point of entry) Patient coming from: Home  I have personally briefly reviewed patient's old medical records in [place]  Chief Complaint: Speech problem and tremors  HPI: Tommy Green is a 75 y.o. male with medical history significant of rheumatoid arthritis, CKD stage II, HTN, IDDM, diabetic neuropathy, gout, presented with episode of aphasia, and worsening tremors.  Patient underwent lumbar fusion operation 3 weeks ago and was discharged to a rehab center.  Since her discharge from hospital, patient family found patient has had episodes of tremors, and visual hallucinations.  Short episodes, dissolved by its own.  However yesterday afternoon, patient developed sudden onset of speech problem, "not making any sense".  Whole episode lasted about 30 minutes and resolved by its own.  Meantime, the tremor of the arms and torso came back and became more severe and persistent.  Patient denies any headache, no vision changes denies any numbness or weakness of arms or legs. ED Course: Was found to have AKI with creatinine 2.2 compared to baseline 1.2 to 1.5 one month ago.  Review of Systems: As per HPI otherwise 14 point review of systems negative.    Past Medical History:  Diagnosis Date  . Anemia   . Essential hypertension 07/18/2017  . History of colonic polyps   . History of rheumatic fever   . History of stroke 07/18/2017  . Hypertriglyceridemia 07/18/2017  . Type 2 diabetes mellitus with diabetic neuropathy, unspecified ([place]) 07/18/2017    Past Surgical History:  Procedure Laterality Date  . COLONOSCOPY     around 2018 [facility]  . COLONOSCOPY  09/02/2020  . CRANIOTOMY Left 10/25/2019   Procedure: CRANIOTOMY HEMATOMA EVACUATION SUBDURAL;  Surgeon: Vallarie Mare, MD;  Location: [facility];  Service: Neurosurgery;  Laterality: Left;  . ESOPHAGOGASTRODUODENOSCOPY     around 2018 with [facility]  . FOOT CAPSULOTOMY Left 07/25/2008   Mid Foot #2 MPJ  . Hammertoe Repair Left 07/25/2008   #2 toe  . SPINAL FUSION    . TARSAL TUNNEL RELEASE Left 07/25/2008  . UPPER GASTROINTESTINAL ENDOSCOPY  09/02/2020     reports that he quit smoking about 49 years ago. His smoking use included cigarettes. He quit after 10.00 years of use. He has never used smokeless tobacco. He reports current alcohol use. He reports that he does not use drugs.  Allergies  Allergen Reactions  . Fluzone Quadrivalent [Influenza Vac Split Quad] Other (See Comments)    "Got the flu"  . Influenza Vaccines Other (See Comments)    "Got the flu"  . Aspirin Other (See Comments)    Gastroesophageal reflux and "allergic," per MAR   . Atorvastatin Other (See Comments)    Myalgias, GI upset, and "allergic," per MAR  . Canagliflozin Other (See Comments)    Pt reports that the use of Invokana caused acute KIDNEY FAILURE ([facility] records) and "allergic," per MAR  . Statins Other (See Comments)    Muscle pain- "allergic," per [facility]    Family History  Problem Relation Age of Onset  . Hyperlipidemia Mother   . Colon polyps Mother   . Hyperlipidemia Father   . Alcoholism Brother   . Healthy Son   . Healthy Son   . Cancer Neg Hx   . Colon cancer Neg  Hx   . Esophageal cancer Neg Hx   . Rectal cancer Neg Hx   . Stomach cancer Neg Hx      Prior to Admission medications   Medication Sig Start Date End Date Taking? Authorizing Provider  B Complex Vitamins (B COMPLEX PO) Take 1 tablet by mouth daily.   Yes [provider]  carvedilol (COREG) 12.5 MG tablet Take 1 tablet (12.5 mg total) by mouth 2 (two) times daily with a meal. 12/01/20  Yes Wendling, Crosby Oyster, DO  Cholecalciferol (VITAMIN D3 SUPER STRENGTH) 50 MCG (2000 UT) CAPS Take 2,000 Units by mouth daily.   Yes [provider]  gabapentin (NEURONTIN) 600 MG tablet TAKE 1 TABLET 3 TIMES A DAY Patient taking differently: Take 600 mg by mouth 3 (three) times daily. 02/20/21  Yes [name], DO  vitamin B-12 (CYANOCOBALAMIN) 1000 MCG tablet Take 1,000 mcg by mouth daily.   Yes [provider]  alclomethasone (ACLOVATE) 0.05 % cream Apply 1 application topically 2 (two) times daily. 11/13/20   [provider]  b complex vitamins capsule Take 1 capsule by mouth daily. Patient not taking: Reported on 03/05/2021    [provider]  cetirizine (ZYRTEC) 10 MG tablet Take 1 tablet (10 mg total) by mouth daily. 04/22/20   [name], DO  colchicine 0.6 MG tablet Take 1 tablet (0.6 mg total) by mouth daily as needed. 05/15/20   Hilts, Legrand Como, MD  Etanercept (ENBREL MINI) 50 MG/ML SOCT Inject 50 mg into the skin once a week. 11/28/20   [name], PA-C  fenofibrate micronized (LOFIBRA) 200 MG capsule TAKE 1 CAPSULE DAILY BEFOREBREAKFAST 05/19/20   [name], DO  hydrochlorothiazide (HYDRODIURIL) 25 MG tablet TAKE 1 TABLET DAILY 02/11/20   Nani Ravens, Crosby Oyster, DO  insulin aspart (NOVOLOG) 100 UNIT/ML injection Inject 0-20 Units into the skin 4 (four) times daily -  before meals and at bedtime. 11/16/19   Pokhrel, Corrie Mckusick, MD  insulin degludec (TRESIBA) 100 UNIT/ML SOPN FlexTouch Pen Inject 0.6 mLs (60 Units total) into the skin at bedtime. 09/26/19   [name], DO  leflunomide (ARAVA) 10 MG tablet TAKE 1 TABLET BY MOUTH EVERY DAY 12/26/20   Bo Merino, MD  lisinopril (ZESTRIL) 40 MG tablet Take 1 tablet (40 mg total) by mouth daily. 11/05/20   [name], DO  lovastatin (MEVACOR) 10 MG tablet Take 1 tablet (10 mg total) by mouth at bedtime. 10/23/19   [name], DO  Magnesium 200 MG TABS Take by mouth daily.    [provider]  metFORMIN (GLUCOPHAGE) 500 MG tablet Take 500 mg by mouth 2 (two) times daily  with a meal.    [provider]  Multiple Vitamins-Minerals (MULTIVITAMIN WITH MINERALS) tablet Take 1 tablet by mouth daily.    [provider]  omeprazole (PRILOSEC) 20 MG capsule Take 20 mg by mouth at bedtime. 03/06/20   [provider]  [facility] VERIO test strip 4 (four) times daily. 06/14/20   [provider]  Turmeric 500 MG CAPS Take by mouth daily.    [provider]    Physical Exam: Vitals:   03/05/21 1230 03/05/21 1245 03/05/21 1300 03/05/21 1315  BP: 131/62 128/69 (!) 148/71 128/67  Pulse: 68 68 71 67  Resp: 15 13 11 13   Temp:      TempSrc:      SpO2: 100% 100% 99% 99%    Constitutional: NAD, calm, comfortable Vitals:  03/05/21 1230 03/05/21 1245 03/05/21 1300 03/05/21 1315  BP: 131/62 128/69 (!) 148/71 128/67  Pulse: 68 68 71 67  Resp: 15 13 11 13   Temp:      TempSrc:      SpO2: 100% 100% 99% 99%   Eyes: PERRL, lids and conjunctivae normal ENMT: Mucous membranes are moist. Posterior pharynx clear of any exudate or lesions.Normal dentition.  Neck: normal, supple, no masses, no thyromegaly Respiratory: clear to auscultation bilaterally, no wheezing, no crackles. Normal respiratory effort. No accessory muscle use.  Cardiovascular: Regular rate and rhythm, no murmurs / rubs / gallops. No extremity edema. 2+ pedal pulses. No carotid bruits.  Abdomen: no tenderness, no masses palpated. No hepatosplenomegaly. Bowel sounds positive.  Musculoskeletal: no clubbing / cyanosis. No joint deformity upper and lower extremities. Good ROM, no contractures. Normal muscle tone.  Skin: no rashes, lesions, ulcers. No induration Neurologic: CN 2-12 grossly intact. Sensation intact, DTR normal. Strength 5/5 in all 4.  Coarse tremors on bilateral arms shoulders and jaw. Psychiatric: Normal judgment and insight. Alert and oriented x 3. Normal mood.     Labs on Admission: I have personally reviewed following labs and imaging  studies  CBC: Recent Labs  Lab 03/05/21 0821  WBC 8.0  NEUTROABS 5.1  HGB 8.4*  HCT 27.2*  MCV 85.5  PLT 419   Basic Metabolic Panel: Recent Labs  Lab 03/05/21 0821  NA 136  K 5.8*  CL 106  CO2 22  GLUCOSE 90  BUN 42*  CREATININE 2.20*  CALCIUM 8.9  MG 1.9   GFR: CrCl cannot be calculated (Unknown ideal weight.). Liver Function Tests: No results for input(s): AST, ALT, ALKPHOS, BILITOT, PROT, ALBUMIN in the last 168 hours. No results for input(s): LIPASE, AMYLASE in the last 168 hours. No results for input(s): AMMONIA in the last 168 hours. Coagulation Profile: Recent Labs  Lab 03/05/21 0821  INR 1.1   Cardiac Enzymes: No results for input(s): CKTOTAL, CKMB, CKMBINDEX, TROPONINI in the last 168 hours. BNP (last 3 results) No results for input(s): PROBNP in the last 8760 hours. HbA1C: No results for input(s): HGBA1C in the last 72 hours. CBG: No results for input(s): GLUCAP in the last 168 hours. Lipid Profile: No results for input(s): CHOL, HDL, LDLCALC, TRIG, CHOLHDL, LDLDIRECT in the last 72 hours. Thyroid Function Tests: No results for input(s): TSH, T4TOTAL, FREET4, T3FREE, THYROIDAB in the last 72 hours. Anemia Panel: No results for input(s): VITAMINB12, FOLATE, FERRITIN, TIBC, IRON, RETICCTPCT in the last 72 hours. Urine analysis:    Component Value Date/Time   COLORURINE STRAW (A) 03/05/2021 1133   APPEARANCEUR CLEAR 03/05/2021 1133   LABSPEC 1.008 03/05/2021 1133   PHURINE 7.0 03/05/2021 1133   GLUCOSEU NEGATIVE 03/05/2021 1133   [place] 03/05/2021 [place] 03/05/2021 [place] 03/05/2021 [place] 03/05/2021 1133   NITRITE NEGATIVE 03/05/2021 1133   [place] 03/05/2021 1133    Radiological Exams on Admission: CT Head Wo Contrast  Result Date: 03/05/2021 CLINICAL DATA:  Hand tremors and slurred speech EXAM: CT HEAD WITHOUT CONTRAST TECHNIQUE: Contiguous axial images  were obtained from the base of the skull through the vertex without intravenous contrast. COMPARISON:  September 10, 2020 FINDINGS: Brain: There is mild diffuse atrophy with asymmetric atrophy in the right frontal and parietal regions compared to the left side, a finding stable from prior study. There is no intracranial mass, hemorrhage, extra-axial fluid collection, or midline shift. There  remains a linear dural area of increased opacity at the level of the craniotomy, likely due to previous subdural hematoma with repair and potential postoperative thickening. This appearance is stable compared to the prior study. There is not felt to be recurrence of subdural hematoma in this area based on the appearance of the prior study. There is evidence of a prior infarct in the left frontal lobe anterior and lateral to the anterior horn of the left lateral ventricle. Patchy periventricular small vessel disease also noted. No acute appearing infarct evident. Vascular: No appreciable hyperdense vessel. Calcification noted in the carotid siphon regions. Skull: Evidence of previous left frontal and parietal craniotomy, unchanged. No new bone lesions. Sinuses/Orbits: Mucosal thickening in several ethmoid air cells noted. There is also mild mucosal thickening in the inferior left maxillary antrum. Orbits appear symmetric bilaterally. Other: Mastoid air cells clear. IMPRESSION: Asymmetric atrophy right frontal and parietal lobes compared to the left side, stable. Dural thickening on the left is stable compared to prior study, likely due to scarring/repair of dura at time of craniotomy on the left. No acute appearing extra-axial fluid collection evident. No mass or hemorrhage. There is evidence of a prior infarct in the left frontal lobe as well as patchy periventricular small vessel disease. No acute infarct evident. There are foci of arterial vascular calcification. Mucosal thickening noted in several paranasal sinus regions.  Electronically Signed   By: Lowella Grip III M.D.   On: 03/05/2021 09:23    EKG: Independently reviewed. No tented T waves.  Assessment/Plan Active Problems:   TIA (transient ischemic attack)  (please populate well all problems here in Problem List. (For example, if patient is on BP meds at home and you resume or decide to hold them, it is a problem that needs to be her. Same for CAD, COPD, HLD and so on)   Aphasia -TIA vs polypharmacy/gabapentin toxicity. -Neurology plan for MRI brain, if TIA rule out than likely the etiology is a gabapentin toxicity.  Cut down gabapentin from 600 3 times daily to 300 at bedtime to prevent withdrawal.  To patient and his wife at bedside, both expressed understanding and agreed.  Hyperkalemia -No EKG changes, 1 dose of Lokelma given -Potassium level repeat in 4 hours  Tremors -Likely compounding toxicity, management as above.  AKI on CKD stage II -Follows with urology as outpatient. -Clinically looks euvolemic, renal ultrasound to rule out postrenal obstruction.  Check uric acid level. -Can potentially be related to gabapentin toxicity -Hold HCTZ, hold ACEI, hold metformin. -Start amlodipine, start Januvia.  IDDM with insulin resistance -Add on long-acting insulin given the AKI status.  Continue sliding scale  HTN -As above  RA -Fairly controlled, continue disease modification meds.  Gout -Hold colchicine -Avoid HCTZ  DVT prophylaxis: Heparin subcu code Status: Full code Family Communication: Wife at bedside Disposition Plan: Expect less than 2 midnight hospital stay, Consults called: Neuro Admission status: Tele obs   Lequita Halt MD [facility] Pager [phone]  03/05/2021, 1:47 PM

## 2021-03-05 NOTE — ED Triage Notes (Signed)
EMS states pt woke up with aphasia symptoms

## 2021-03-05 NOTE — ED Notes (Signed)
Got patient undressed into a gown on the monitor did ekg shown to er doctor patient is restig with call bell in reach

## 2021-03-05 NOTE — Evaluation (Signed)
Physical Therapy Evaluation Patient Details Name: Tommy Green MRN: [id number] DOB: [birth year] Today's Date: 03/05/2021   History of Present Illness  Pt is a 75 y/o male admitted from SNF secondary to speech difficulty and tremors in BUE and neck. Workup pending. CT of head negative for acute infarct. PMH includes recent lumbar surgery with LLE weakness, subdural hematoma s/p craniotomy, rheumatoid arthritis, CKD stage II, HTN, IDDM, diabetic neuropathy, gout.  Clinical Impression  Pt admitted secondary to problem above with deficits below. LLE weakness at baseline from recent lumbar surgery. Session limited to bed level as pt did not have brace and reports he needs to have it when performing mobility tasks; wife reports she will bring for next session. Required supervision for rolling tasks. Performed supine HEP as well. REquired assist to move LLE. Pt was previously at [facility] for rehab following back surgery. Recommend return to SNF at d/c for continued rehab. Will continue to follow acutely.     Follow Up Recommendations SNF (return to [facility])    Equipment Recommendations  None recommended by PT    Recommendations for Other Services       Precautions / Restrictions Precautions Precautions: Back;Fall Precaution Booklet Issued: No Precaution Comments: verbally reviewed back precautions Required Braces or Orthoses: Other Brace;Spinal Brace Spinal Brace:  (Did not specify type of brace. Per wife, needs to have brace on when OOB) Other Brace: Has L AFO Restrictions Weight Bearing Restrictions: No      Mobility  Bed Mobility Overal bed mobility: Needs Assistance Bed Mobility: Rolling Rolling: Supervision         General bed mobility comments: Supervsion for safety to perform log roll in bed. Required use of bed rails. Did not perform further mobility as pt did not have brace and wife reports pt needs for OOB. Pt's wife to bring for next session.    Transfers                     Ambulation/Gait                Stairs            Wheelchair Mobility    Modified Rankin (Stroke Patients Only)       Balance                                             Pertinent Vitals/Pain Pain Assessment: Faces Faces Pain Scale: Hurts even more Pain Location: back and LLE Pain Descriptors / Indicators: Grimacing;Guarding Pain Intervention(s): Limited activity within patient's tolerance;Monitored during session;Repositioned    Home Living Family/patient expects to be discharged to:: Skilled nursing facility                 Additional Comments: Was at [facility] for [facility]    Prior Function Level of Independence: Needs assistance   Gait / Transfers Assistance Needed: Was working with PT on ambulation in parallel bars. Pt reports LLE buckles very easily. Otherwise is using WC and requires staff assist for transfers.  ADL's / Homemaking Assistance Needed: Requires assist for ADL tasks.        Hand Dominance        Extremity/Trunk Assessment   Upper Extremity Assessment Upper Extremity Assessment: Defer to OT evaluation    Lower Extremity Assessment Lower Extremity Assessment: LLE deficits/detail LLE Deficits / Details: weakness noted throughout LLE.  Grossly 1/5 at ankles. Required assist to perform heel slide    Cervical / Trunk Assessment Cervical / Trunk Assessment: Other exceptions Cervical / Trunk Exceptions: recent lumbar surgery  Communication   Communication: Expressive difficulties  Cognition Arousal/Alertness: Awake/alert Behavior During Therapy: WFL for tasks assessed/performed Overall Cognitive Status: Within Functional Limits for tasks assessed                                        General Comments General comments (skin integrity, edema, etc.): Pt's wife present during session    Exercises General Exercises - Lower Extremity Ankle Circles/Pumps:  AROM;Right;PROM;Left Heel Slides: AROM;Right;AAROM;Left;10 reps   Assessment/Plan    PT Assessment Patient needs continued PT services  PT Problem List Decreased strength;Decreased balance;Decreased activity tolerance;Decreased mobility;Decreased knowledge of use of DME;Decreased knowledge of precautions       PT Treatment Interventions Gait training;DME instruction;Therapeutic activities;Functional mobility training;Therapeutic exercise;Balance training;Patient/family education;Neuromuscular re-education    PT Goals (Current goals can be found in the Care Plan section)  Acute Rehab PT Goals Patient Stated Goal: to figure out what is going on PT Goal Formulation: With patient Time For Goal Achievement: 03/19/21 Potential to Achieve Goals: Good    Frequency Min 2X/week   Barriers to discharge        Co-evaluation               AM-PAC PT "6 Clicks" Mobility  Outcome Measure Help needed turning from your back to your side while in a flat bed without using bedrails?: A Little Help needed moving from lying on your back to sitting on the side of a flat bed without using bedrails?: A Little Help needed moving to and from a bed to a chair (including a wheelchair)?: A Lot Help needed standing up from a chair using your arms (e.g., wheelchair or bedside chair)?: A Lot Help needed to walk in hospital room?: A Lot Help needed climbing 3-5 steps with a railing? : Total 6 Click Score: 13    End of Session   Activity Tolerance: Patient tolerated treatment well Patient left: in bed;with call bell/phone within reach (on stretcher in ED) Nurse Communication: Mobility status PT Visit Diagnosis: Unsteadiness on feet (R26.81);Muscle weakness (generalized) (M62.81);History of falling (Z91.81)    Time: 1349-1406 PT Time Calculation (min) (ACUTE ONLY): 17 min   Charges:   PT Evaluation $PT Eval Moderate Complexity: 1 Mod          Reuel Derby, PT, DPT  Acute Rehabilitation  Services  Pager: [phone] Office: [phone]   Rudean Hitt 03/05/2021, 5:12 PM

## 2021-03-05 NOTE — Consult Note (Signed)
Neurology Consultation  Reason for Consult: Concern for TIA due to multiple episodes of slurred speech Referring Physician: Dr. Baird Kay  CC: Slurred speech, tremors  History is obtained from: Chart, patient, wife  HPI: Tommy Green is a 75 y.o. male past medical history of diabetes, history of prior stroke, subdural hematoma status post left craniotomy evacuation 2021, recent lumbar spinal fusion with residual left leg weakness, presented to the emergency room for about a day worth of slurred speech and tremors. He lives in a facility and was in his usual state of health till about 2 days ago.  Yesterday around since afternoon his wife started noticing that he is slurring his words intermittently.  She felt that her word was not making sense. This happened yesterday for about 30 minutes and then again this morning for about 30 minutes before nearly completely resolving. He also says that he has been noticing that his whole body is twitching.  He reports tremors in the hands and shoulders which are also intermittent. He denies any new medication changes that have been done recently but he has been on pain medications after a spinal fusion in April. Reports compliance to medication Denies illicit substance use Denies any headache or visual symptoms Denies tingling weakness or numbness.    LKW: 12 PM on 03/04/2021 tpa given?: no, likely asterixis in the setting of medication toxicity and renal failure Premorbid modified Rankin scale (mRS): 3-4  ROS: Full ROS was performed and is negative except as noted in the HPI.   Past Medical History:  Diagnosis Date  . Anemia   . Essential hypertension 07/18/2017  . History of colonic polyps   . History of rheumatic fever   . History of stroke 07/18/2017  . Hypertriglyceridemia 07/18/2017  . Type 2 diabetes mellitus with diabetic neuropathy, unspecified ([place]) 07/18/2017    Family History  Problem Relation Age of Onset  . Hyperlipidemia  Mother   . Colon polyps Mother   . Hyperlipidemia Father   . Alcoholism Brother   . Healthy Son   . Healthy Son   . Cancer Neg Hx   . Colon cancer Neg Hx   . Esophageal cancer Neg Hx   . Rectal cancer Neg Hx   . Stomach cancer Neg Hx      Social History:   reports that he quit smoking about 49 years ago. His smoking use included cigarettes. He quit after 10.00 years of use. He has never used smokeless tobacco. He reports current alcohol use. He reports that he does not use drugs.  Medications No current facility-administered medications for this encounter.  Current Outpatient Medications:  .  alclomethasone (ACLOVATE) 0.05 % cream, Apply 1 application topically 2 (two) times daily., Disp: , Rfl:  .  b complex vitamins capsule, Take 1 capsule by mouth daily., Disp: , Rfl:  .  carvedilol (COREG) 12.5 MG tablet, Take 1 tablet (12.5 mg total) by mouth 2 (two) times daily with a meal., Disp: 180 tablet, Rfl: 1 .  cetirizine (ZYRTEC) 10 MG tablet, Take 1 tablet (10 mg total) by mouth daily., Disp: 30 tablet, Rfl: 3 .  colchicine 0.6 MG tablet, Take 1 tablet (0.6 mg total) by mouth daily as needed., Disp: 30 tablet, Rfl: 1 .  Etanercept (ENBREL MINI) 50 MG/ML SOCT, Inject 50 mg into the skin once a week., Disp: 12 mL, Rfl: 0 .  fenofibrate micronized (LOFIBRA) 200 MG capsule, TAKE 1 CAPSULE DAILY BEFOREBREAKFAST, Disp: 90 capsule, Rfl: 3 .  gabapentin (NEURONTIN) 600 MG tablet, TAKE 1 TABLET 3 TIMES A DAY, Disp: 270 tablet, Rfl: 0 .  hydrochlorothiazide (HYDRODIURIL) 25 MG tablet, TAKE 1 TABLET DAILY, Disp: 90 tablet, Rfl: 3 .  insulin aspart (NOVOLOG) 100 UNIT/ML injection, Inject 0-20 Units into the skin 4 (four) times daily -  before meals and at bedtime., Disp: , Rfl:  .  insulin degludec (TRESIBA) 100 UNIT/ML SOPN FlexTouch Pen, Inject 0.6 mLs (60 Units total) into the skin at bedtime., Disp: 5 pen, Rfl: 2 .  leflunomide (ARAVA) 10 MG tablet, TAKE 1 TABLET BY MOUTH EVERY DAY, Disp: 90  tablet, Rfl: 0 .  lisinopril (ZESTRIL) 40 MG tablet, Take 1 tablet (40 mg total) by mouth daily., Disp: 90 tablet, Rfl: 3 .  lovastatin (MEVACOR) 10 MG tablet, Take 1 tablet (10 mg total) by mouth at bedtime., Disp: 30 tablet, Rfl: 3 .  Magnesium 200 MG TABS, Take by mouth daily., Disp: , Rfl:  .  metFORMIN (GLUCOPHAGE) 500 MG tablet, Take 500 mg by mouth 2 (two) times daily with a meal., Disp: , Rfl:  .  Multiple Vitamins-Minerals (MULTIVITAMIN WITH MINERALS) tablet, Take 1 tablet by mouth daily., Disp: , Rfl:  .  omeprazole (PRILOSEC) 20 MG capsule, Take 20 mg by mouth at bedtime., Disp: , Rfl:  .  ONETOUCH VERIO test strip, 4 (four) times daily., Disp: , Rfl:  .  Turmeric 500 MG CAPS, Take by mouth daily., Disp: , Rfl:    Exam: Current vital signs: BP 139/64   Pulse 70   Temp 98.2 F (36.8 C) (Oral)   Resp 14   SpO2 99%  Vital signs in last 24 hours: Temp:  [98.2 F (36.8 C)] 98.2 F (36.8 C) (05/19 0809) Pulse Rate:  [65-77] 70 (05/19 1200) Resp:  [11-22] 14 (05/19 1200) BP: (110-139)/(59-98) 139/64 (05/19 1200) SpO2:  [96 %-100 %] 99 % (05/19 1200) General: Awake alert in no distress HEENT: Normocephalic atraumatic Lungs: Clear Cardiovascular: Regular rate rhythm Abdomen soft nondistended nontender Extremities warm well perfused with intact pulses Neurological examination awake alert oriented x3 Speech is nondysarthric Evidence of aphasia Cranial nerves II to XII intact Motor exam: Right upper right lower and left upper extremity 5/5 strength.  Pain limited exam of the left lower extremity with 3/5 strength of the hip. Sensory exam: Intact to touch Coordination: There is intermittent myoclonic looking jerks of his shoulders and trunk along with on outstretched arms there is asterixis in both arms.  No obvious gross dysmetria. Gait testing deferred at this time NIH stroke scale 1a Level of Conscious.: 0 1b LOC Questions: 0 1c LOC Commands: 0 2 Best Gaze: 0 3 Visual:  0 4 Facial Palsy: 0 5a Motor Arm - left: 2 5b Motor Arm - Right: 0 6a Motor Leg - Left: 0 6b Motor Leg - Right: 0 7 Limb Ataxia: 1 8 Sensory: 0 9 Best Language: 0 10 Dysarthria: 0 11 Extinct. and Inatten.: 0 TOTAL: 3   Labs I have reviewed labs in epic and the results pertinent to this consultation are:  CBC    Component Value Date/Time   WBC 8.0 03/05/2021 0821   RBC 3.18 (L) 03/05/2021 0821   HGB 8.4 (L) 03/05/2021 0821   HGB 11.6 (L) 04/15/2006 0905   HCT 27.2 (L) 03/05/2021 0821   HCT 33.9 (L) 04/15/2006 0905   PLT 311 03/05/2021 0821   PLT 422 (H) 04/15/2006 0905   MCV 85.5 03/05/2021 0821   MCV 86.9 04/15/2006 0905  MCH 26.4 03/05/2021 0821   MCHC 30.9 03/05/2021 0821   RDW 16.7 (H) 03/05/2021 0821   RDW 14.6 04/15/2006 0905   LYMPHSABS 1.8 03/05/2021 0821   LYMPHSABS 2.1 04/15/2006 0905   MONOABS 0.6 03/05/2021 0821   MONOABS 0.5 04/15/2006 0905   EOSABS 0.4 03/05/2021 0821   EOSABS 0.3 04/15/2006 0905   BASOSABS 0.1 03/05/2021 0821   BASOSABS 0.1 04/15/2006 0905    CMP     Component Value Date/Time   NA 136 03/05/2021 0821   K 5.8 (H) 03/05/2021 0821   CL 106 03/05/2021 0821   CO2 22 03/05/2021 0821   GLUCOSE 90 03/05/2021 0821   BUN 42 (H) 03/05/2021 0821   CREATININE 2.20 (H) 03/05/2021 0821   CREATININE 1.59 (H) 07/15/2020 1135   CALCIUM 8.9 03/05/2021 0821   PROT 6.3 07/15/2020 1135   ALBUMIN 3.5 05/16/2020 1356   AST 14 07/15/2020 1135   ALT 9 07/15/2020 1135   ALKPHOS 41 05/16/2020 1356   BILITOT 0.2 07/15/2020 1135   GFRNONAA 30 (L) 03/05/2021 0821   GFRNONAA 42 (L) 07/15/2020 1135   GFRAA 48 (L) 07/15/2020 1135   Creatinine 2.2 which is elevated from baseline of 1.5   Imaging I have reviewed the images obtained:  CT-scan of the brain-asymmetric atrophy of right frontal parietal lobe compared to the left side which is stable from prior scans.  Dural thickening on the left side is stable compared to prior study likely due to the  repair of dura at the time of the craniotomy on the left side. No acute changes.  Assessment:  75 year old past history of diabetes, history of prior stroke and left-sided subdural hematoma status post left craniectomy evacuation in 2021, recent lumbar spinal fusion with residual left leg weakness presenting for 24 hours or little more of intermittent slurring of speech and hand tremors. On examination he has asterixis but other than that his examination is at baseline with left leg weakness after her his spinal fusion. He is currently on Neurontin and has significantly deranged renal function. Neurontin is known to cause myoclonic jerking in patients with renal impairment. I suspect that his current presentation is likely due to gabapentin toxicity but given his extensive cerebrovascular risk factor history and a history of subdural hematoma and craniectomy, I would also evaluate him for stroke versus seizure with imaging and EEG. I would reserve the stroke work-up to be done only if the MRI is positive for stroke  Impression: Gabapentin toxicity in the setting of renal dysfunction AKI Evaluate for stroke versus seizure-I do not think this was a TIA.  Recommendations:  Reduce the dose of gabapentin-he is on 600 3 times daily I would reduce it to half if not more at this time.  Management of AKI per primary team  MRI of the brain without contrast-no stroke work-up unless the MRI shows a stroke.  Routine EEG  Minimize other pain medications-need to get a updated list of medications from his facility-would request pharmacy technologist to get [place] a and most updated list.  Wife was under the impression that he is also getting a medication that starts with the letter N-?Nortriptyline.  If that is the case, those medications also need to be optimized.   Discussed my plan with Dr. Miki Kins and Dr. Roslynn Amble in the ED. Discussed my plan management with the wife and answered all her questions I  will follow  -- Amie Portland, MD Neurologist [facility] Pager: [phone]

## 2021-03-05 NOTE — ED Notes (Signed)
Got patient off the bed pan patient is now resting with call bell in reach and family at bedside

## 2021-03-05 NOTE — ED Notes (Signed)
Dinner tray ordered.

## 2021-03-05 NOTE — ED Notes (Signed)
Add on was called in to lab, [facility] RN aware.

## 2021-03-05 NOTE — ED Notes (Signed)
Report given to Brittany RN

## 2021-03-05 NOTE — ED Provider Notes (Signed)
[facility] EMERGENCY DEPARTMENT Provider Note   CSN: [id number] Arrival date & time: 03/05/21  0800     History Chief Complaint  Patient presents with  . Tremors    Aphasia intermittent since 3pm yesterday     Tommy Green is a 75 y.o. male.  HPI 75 year old male with history of hypertension, stroke, SDH, diabetes, and recent spinal fusion surgery presents to the emergency department for speech difficulties and tremors.  He states that yesterday he started having these tremors that look like a sudden chill.  States that they were intermittent with waxing waning intensity.  Nothing made them better, nothing made them worse.  Denies history of anything for this previously.  States that they were involuntary and worse his head and bilateral upper extremities.  He states this morning, when his wife got to his facility where he is in rehab after his spinal fusion surgery, he tried to greet her but only stuttering words came out.  States that people were unable to understand him.  Denies history of anything like this previously.  It is since resolved and he is speaking in full sentences now, he believes it lasted till about 7 AM.  Nothing made it better, nothing made it worse.  Did not take anything for the symptoms.    Past Medical History:  Diagnosis Date  . Anemia   . Essential hypertension 07/18/2017  . History of colonic polyps   . History of rheumatic fever   . History of stroke 07/18/2017  . Hypertriglyceridemia 07/18/2017  . Type 2 diabetes mellitus with diabetic neuropathy, unspecified ([place]) 07/18/2017    Patient Active Problem List   Diagnosis Date Noted  . TIA (transient ischemic attack) 03/05/2021  . Body mass index (BMI) 36.0-36.9, adult 10/03/2020  . Subdural hemorrhage following injury without open intracranial wound and with prolonged loss of consciousness (more than 24 hours) without return to pre-existing conscious level ([place]) 10/03/2020  . Sore  throat 12/14/2019  . AKI (acute kidney injury) ([place])   . Respiratory failure ([place])   . Acute encephalopathy   . Hypernatremia   . Dyspnea   . Acute respiratory failure with hypoxemia ([place])   . SDH (subdural hematoma) ([place]) 10/24/2019  . Chronic pain of right knee 10/03/2019  . Cyclic vomiting syndrome 08/06/2019  . Neuropathy 12/25/2018  . Bilateral hip pain 08/24/2018  . AK (actinic keratosis) 08/24/2018  . Neoplasm of uncertain behavior 25/42/7062  . 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 Tendinopathy of right gluteus medius 07/13/2018  . Tendinopathy of left gluteus medius 07/13/2018  . Squamous cell carcinoma in situ (SCCIS) of skin 03/24/2018  . Skin lesion 03/17/2018  . Vitamin D deficiency 11/29/2017  . Stage 3 chronic kidney disease ([place]) 10/28/2017  . Primary osteoarthritis of right hip 09/11/2017  . Essential hypertension 07/18/2017  . Hyperlipidemia associated with type 2 diabetes mellitus ([place]) 07/18/2017  . Type 2 diabetes mellitus with diabetic neuropathy, unspecified ([place]) 07/18/2017  . Heart murmur 07/18/2017  . Hypertriglyceridemia 07/18/2017  . History of stroke 07/18/2017  . DDD (degenerative disc disease), lumbar 06/15/2017  . Iron deficiency anemia 06/15/2017  . Stroke ([place]) 06/15/2017  . Encounter for hepatitis C screening test for low risk patient 06/30/2016  . Anemia 06/19/2016  . Microalbuminuria due to type 2 diabetes mellitus ([place]) 03/19/2016  . Hypertrophy of inferior nasal turbinate 12/20/2015  . Nasal congestion 12/15/2015  . Disease of nasal cavity and sinuses 12/10/2015  . Degenerative tear of acetabular labrum of  left hip 11/04/2015  . Cellulitis and abscess of left leg 09/25/2015  . History of tobacco abuse 05/15/2015  . Morbid (severe) obesity due to excess calories ([place]) 04/11/2015  . Disorder of both eustachian tubes 01/27/2015  . Maxillary sinusitis 01/13/2015  . Lipoprotein deficiency 01/31/2014  . Gastro-esophageal reflux disease without  esophagitis 11/02/2013  . Rheumatic fever 11/02/2013  . Erectile dysfunction associated with type 2 diabetes mellitus ([place]) 07/31/2013  . Pain in joint, ankle and foot 02/06/2013  . Type 2 diabetes mellitus with hyperglycemia, without long-term current use of insulin ([place]) 11/16/2012    Past Surgical History:  Procedure Laterality Date  . COLONOSCOPY     around 2018 [facility]  . COLONOSCOPY  09/02/2020  . CRANIOTOMY Left 10/25/2019   Procedure: CRANIOTOMY HEMATOMA EVACUATION SUBDURAL;  Surgeon: Vallarie Mare, MD;  Location: [facility];  Service: Neurosurgery;  Laterality: Left;  . ESOPHAGOGASTRODUODENOSCOPY     around 2018 with [facility]  . FOOT CAPSULOTOMY Left 07/25/2008   Mid Foot #2 MPJ  . Hammertoe Repair Left 07/25/2008   #2 toe  . SPINAL FUSION    . TARSAL TUNNEL RELEASE Left 07/25/2008  . UPPER GASTROINTESTINAL ENDOSCOPY  09/02/2020       Family History  Problem Relation Age of Onset  . Hyperlipidemia Mother   . Colon polyps Mother   . Hyperlipidemia Father   . Alcoholism Brother   . Healthy Son   . Healthy Son   . Cancer Neg Hx   . Colon cancer Neg Hx   . Esophageal cancer Neg Hx   . Rectal cancer Neg Hx   . Stomach cancer Neg Hx     Social History   Tobacco Use  . Smoking status: Former Smoker    Years: 10.00    Types: Cigarettes    Quit date: 1973    Years since quitting: 49.4  . Smokeless tobacco: Never Used  Vaping Use  . Vaping Use: Never used  Substance Use Topics  . Alcohol use: Yes    Comment: occ beer  . Drug use: No    Home Medications Prior to Admission medications   Medication Sig Start Date End Date Taking? Authorizing Provider  acetaminophen (TYLENOL) 325 MG tablet Take 650 mg by mouth every 6 (six) hours as needed (for pain).   Yes [provider]  B Complex Vitamins (B COMPLEX PO) Take 1 tablet by mouth daily.   Yes [provider]  carvedilol (COREG) 12.5 MG tablet Take 1 tablet (12.5 mg total) by mouth 2  (two) times daily with a meal. 12/01/20  Yes Wendling, Crosby Oyster, DO  Cholecalciferol (VITAMIN D3 SUPER STRENGTH) 50 MCG (2000 UT) CAPS Take 2,000 Units by mouth daily.   Yes [provider]  fenofibrate micronized (LOFIBRA) 200 MG capsule TAKE 1 CAPSULE DAILY BEFOREBREAKFAST Patient taking differently: Take by mouth daily before breakfast. 05/19/20  Yes Wendling, Crosby Oyster, DO  gabapentin (NEURONTIN) 600 MG tablet TAKE 1 TABLET 3 TIMES A DAY Patient taking differently: Take 600 mg by mouth 3 (three) times daily. 02/20/21  Yes [name], DO  hydrochlorothiazide (HYDRODIURIL) 25 MG tablet TAKE 1 TABLET DAILY Patient taking differently: Take 25 mg by mouth in the morning. 02/11/20  Yes [name], DO  HYDROcodone-acetaminophen (NORCO/VICODIN) 5-325 MG tablet Take 1 tablet by mouth See admin instructions. Take 1 tablet by mouth three times a day and an additional 1 tablet three times a day as needed for  pain   Yes [provider]  insulin degludec (TRESIBA) 100 UNIT/ML SOPN FlexTouch Pen Inject 54 Units into the skin at bedtime. 09/26/19  Yes [name], DO  insulin lispro (HUMALOG) 100 UNIT/ML injection Inject 0-10 Units into the skin See admin instructions. Inject 0-10 units into the skin before meals, PER SLIDING SCALE: BGL 0-200 = give nothing, 201-250 = 2 units, 251-300 = 4 units, 301-350 = 6 units, 351-400 = 8 units, >400 = 10 units, push fluids, and repeat BGL check in 2 hours. If BGL remains >400 after 2 hours, CALL MD.   Yes [provider]  leflunomide (ARAVA) 10 MG tablet TAKE 1 TABLET BY MOUTH EVERY DAY Patient taking differently: Take 10 mg by mouth daily. 12/26/20  Yes Deveshwar, Abel Presto, MD  lisinopril (ZESTRIL) 40 MG tablet Take 1 tablet (40 mg total) by mouth daily. 11/05/20  Yes [name], DO  Magnesium 200 MG TABS Take 200 mg by mouth daily in the afternoon.   Yes [provider]  metFORMIN  (GLUMETZA) 1000 MG (MOD) 24 hr tablet Take 1,000 mg by mouth 2 (two) times daily with a meal.   Yes [provider]  Multiple Vitamins-Minerals (MULTIVITAMIN WITH MINERALS) tablet Take 1 tablet by mouth daily.   Yes [provider]  omeprazole (PRILOSEC) 20 MG capsule Take 20 mg by mouth daily before breakfast. 03/06/20  Yes [provider]  Potassium 99 MG TABS Take 99 mg by mouth daily.   Yes [provider]  vitamin B-12 (CYANOCOBALAMIN) 1000 MCG tablet Take 1,000 mcg by mouth daily.   Yes [provider]  alclomethasone (ACLOVATE) 0.05 % cream Apply 1 application topically 2 (two) times daily. Patient not taking: Reported on 03/05/2021 11/13/20   [provider]  b complex vitamins capsule Take 1 capsule by mouth daily. Patient not taking: No sig reported    [provider]  cetirizine (ZYRTEC) 10 MG tablet Take 1 tablet (10 mg total) by mouth daily. Patient not taking: Reported on 03/05/2021 04/22/20   [name], DO  colchicine 0.6 MG tablet Take 1 tablet (0.6 mg total) by mouth daily as needed. Patient not taking: Reported on 03/05/2021 05/15/20   Hilts, Legrand Como, MD  Etanercept (ENBREL MINI) 50 MG/ML SOCT Inject 50 mg into the skin once a week. Patient not taking: Reported on 03/05/2021 11/28/20   [name], PA-C  insulin aspart (NOVOLOG) 100 UNIT/ML injection Inject 0-20 Units into the skin 4 (four) times daily -  before meals and at bedtime. Patient not taking: No sig reported 11/16/19   Pokhrel, Corrie Mckusick, MD  lovastatin (MEVACOR) 10 MG tablet Take 1 tablet (10 mg total) by mouth at bedtime. Patient not taking: Reported on 03/05/2021 10/23/19   [name], DO  metFORMIN (GLUCOPHAGE) 500 MG tablet Take 500 mg by mouth 2 (two) times daily with a meal. Patient not taking: No sig reported    [provider]  [facility] VERIO test strip 4 (four) times daily. Patient not taking: Reported on 03/05/2021 06/14/20    [provider]  Turmeric 500 MG CAPS Take by mouth daily. Patient not taking: Reported on 03/05/2021    [provider]    Allergies    Fluzone quadrivalent [influenza vac split quad], Influenza vaccines, Aspirin, Atorvastatin, Canagliflozin, and Statins  Review of Systems   Review of Systems  Constitutional: Negative for chills and fever.  HENT: Negative for ear pain and sore throat.   Eyes:  Negative for pain and visual disturbance.  Respiratory: Negative for cough and shortness of breath.   Cardiovascular: Negative for chest pain and palpitations.  Gastrointestinal: Negative for abdominal pain and vomiting.  Genitourinary: Negative for dysuria and hematuria.  Musculoskeletal: Negative for arthralgias and back pain.  Skin: Negative for color change and rash.  Neurological: Positive for tremors and speech difficulty. Negative for seizures and syncope.  Psychiatric/Behavioral: The patient is not nervous/anxious.   All other systems reviewed and are negative.   Physical Exam Updated Vital Signs BP (!) 117/98   Pulse 74   Temp 98.2 F (36.8 C) (Oral)   Resp (!) 22   SpO2 95%   Physical Exam Vitals and nursing note reviewed.  Constitutional:      Appearance: He is well-developed.  HENT:     Head: Normocephalic and atraumatic.     Right Ear: External ear normal.     Left Ear: External ear normal.  Eyes:     Extraocular Movements: Extraocular movements intact.     Conjunctiva/sclera: Conjunctivae normal.     Pupils: Pupils are equal, round, and reactive to light.  Cardiovascular:     Rate and Rhythm: Normal rate and regular rhythm.     Heart sounds: Normal heart sounds. No murmur heard.   Pulmonary:     Effort: Pulmonary effort is normal. No respiratory distress.     Breath sounds: Normal breath sounds.  Abdominal:     General: Abdomen is flat.     Palpations: Abdomen is soft.     Tenderness: There is no abdominal tenderness. There is no guarding  or rebound.  Musculoskeletal:        General: No deformity.     Cervical back: Normal range of motion and neck supple. No tenderness.  Skin:    General: Skin is warm and dry.     Capillary Refill: Capillary refill takes less than 2 seconds.  Neurological:     Mental Status: He is alert. Mental status is at baseline.     Comments: Cranial nerves intact.  Bilateral upper extremities 5/5 in strength.  Right lower extremity 5/5 in strength.  Left lower extremity with weakness at baseline since surgery.  Sensation intact and symmetric bilaterally.  Normal finger-nose-finger.  Psychiatric:        Mood and Affect: Mood normal.        Behavior: Behavior normal.     ED Results / Procedures / Treatments   Labs (all labs ordered are listed, but only abnormal results are displayed) Labs Reviewed  CBC WITH DIFFERENTIAL/PLATELET - Abnormal; Notable for the following components:      Result Value   RBC 3.18 (*)    Hemoglobin 8.4 (*)    HCT 27.2 (*)    RDW 16.7 (*)    All other components within normal limits  BASIC METABOLIC PANEL - Abnormal; Notable for the following components:   Potassium 5.8 (*)    BUN 42 (*)    Creatinine, Ser 2.20 (*)    GFR, Estimated 30 (*)    All other components within normal limits  URINALYSIS, ROUTINE W REFLEX MICROSCOPIC - Abnormal; Notable for the following components:   Color, Urine STRAW (*)    All other components within normal limits  MAGNESIUM  PROTIME-INR  BASIC METABOLIC PANEL  HEMOGLOBIN A1C  URIC ACID  IRON AND TIBC  FERRITIN  RETICULOCYTES    EKG EKG Interpretation  Date/Time:  Thursday Mar 05 2021 08:11:08 EDT Ventricular Rate:  66 PR Interval:  167 QRS Duration: 97 QT Interval:  477 QTC Calculation: 500 R Axis:   -3 Text Interpretation: Sinus rhythm Borderline repolarization abnormality Borderline prolonged QT interval Confirmed by Madalyn Rob [phone]) on 03/05/2021 11:32:33 AM   Radiology CT Head Wo Contrast  Result Date:  03/05/2021 CLINICAL DATA:  Hand tremors and slurred speech EXAM: CT HEAD WITHOUT CONTRAST TECHNIQUE: Contiguous axial images were obtained from the base of the skull through the vertex without intravenous contrast. COMPARISON:  September 10, 2020 FINDINGS: Brain: There is mild diffuse atrophy with asymmetric atrophy in the right frontal and parietal regions compared to the left side, a finding stable from prior study. There is no intracranial mass, hemorrhage, extra-axial fluid collection, or midline shift. There remains a linear dural area of increased opacity at the level of the craniotomy, likely due to previous subdural hematoma with repair and potential postoperative thickening. This appearance is stable compared to the prior study. There is not felt to be recurrence of subdural hematoma in this area based on the appearance of the prior study. There is evidence of a prior infarct in the left frontal lobe anterior and lateral to the anterior horn of the left lateral ventricle. Patchy periventricular small vessel disease also noted. No acute appearing infarct evident. Vascular: No appreciable hyperdense vessel. Calcification noted in the carotid siphon regions. Skull: Evidence of previous left frontal and parietal craniotomy, unchanged. No new bone lesions. Sinuses/Orbits: Mucosal thickening in several ethmoid air cells noted. There is also mild mucosal thickening in the inferior left maxillary antrum. Orbits appear symmetric bilaterally. Other: Mastoid air cells clear. IMPRESSION: Asymmetric atrophy right frontal and parietal lobes compared to the left side, stable. Dural thickening on the left is stable compared to prior study, likely due to scarring/repair of dura at time of craniotomy on the left. No acute appearing extra-axial fluid collection evident. No mass or hemorrhage. There is evidence of a prior infarct in the left frontal lobe as well as patchy periventricular small vessel disease. No acute infarct  evident. There are foci of arterial vascular calcification. Mucosal thickening noted in several paranasal sinus regions. Electronically Signed   By: Lowella Grip III M.D.   On: 03/05/2021 09:23    Procedures Procedures   Medications Ordered in ED Medications  0.9 %  sodium chloride infusion ( Intravenous New Bag/Given 03/05/21 1429)  leflunomide (ARAVA) tablet 10 mg (has no administration in time range)  carvedilol (COREG) tablet 12.5 mg (has no administration in time range)  fenofibrate tablet 160 mg (has no administration in time range)  amLODipine (NORVASC) tablet 5 mg (has no administration in time range)  pravastatin (PRAVACHOL) tablet 10 mg (has no administration in time range)  insulin degludec (TRESIBA) 100 UNIT/ML FlexTouch Pen 40 Units (has no administration in time range)  linagliptin (TRADJENTA) tablet 5 mg (has no administration in time range)  pantoprazole (PROTONIX) EC tablet 40 mg (has no administration in time range)  gabapentin (NEURONTIN) capsule 300 mg (has no administration in time range)  loratadine (CLARITIN) tablet 10 mg (has no administration in time range)   stroke: mapping our early stages of recovery book (has no administration in time range)  acetaminophen (TYLENOL) tablet 650 mg (has no administration in time range)    Or  acetaminophen (TYLENOL) 160 MG/5ML solution 650 mg (has no administration in time range)    Or  acetaminophen (TYLENOL) suppository 650 mg (has no administration in time range)  heparin injection 5,000 Units (5,000 Units Subcutaneous  Given 03/05/21 1430)  insulin aspart (novoLOG) injection 0-9 Units (has no administration in time range)  lactated ringers bolus 500 mL (0 mLs Intravenous Stopped 03/05/21 1231)  sodium zirconium cyclosilicate (LOKELMA) packet 10 g (10 g Oral Given 03/05/21 1424)    ED Course  I have reviewed the triage vital signs and the nursing notes.  Pertinent labs & imaging results that were available during my  care of the patient were reviewed by me and considered in my medical decision making (see chart for details).    MDM Rules/Calculators/A&P                          75 year old male presenting for speech difficulty and tremor.  Upon arrival, vital signs are stable.  He is not in acute distress.  Exam reveals no focal neurologic deficits.  He is speaking in full sentences, able to repeat sentences as well.  Abdomen is soft and nontender.  No acute abnormality on cardiovascular exam.  Differential includes: TIA, rigors, intracranial hemorrhage, meningitis, atypical migraine, seizure, stroke  Low suspicion for infectious etiology with no fever.  Patient has no meningismus or other signs of meningitis.  I was able to witness the tremors, they are not consistent with epileptic activity.  Patient is conscious during that activity. Not consistent with focal seizures.  There is no acute bleed or stroke.  BMP shows AKI with mild hyperkalemia.  EKG shows no signs of hyperkalemia.  Normal sinus rhythm with no acute ischemic changes.  CBC does show decreased hemoglobin of 8.4, but the previous comparison was before major back surgery.  Patient denies any hematochezia or melena.  I consulted neurology, Dr. Rory Percy came to evaluate patient in the ED. he reports that he is concerned that the tremors are actually signs of asterixis due to patient's AKI as he is also on gabapentin.  States he is seen this presentation and recommends decrease gabapentin.  Also recommends admission for TIA evaluation with MRI.  I discussed these recommendations with hospitalist, admitted to Dr. Roosevelt Locks for further monitoring and treatment of AKI, metabolic derangement, tremors, and possible TIA.  Patient and wife updated, they are amenable to plan.  Final Clinical Impression(s) / ED Diagnoses Final diagnoses:  AKI (acute kidney injury) [facility])    Rx / [place] Orders ED Discharge Orders    None       Suzan Nailer,  DO 03/05/21 1517    Lucrezia Starch, MD 03/11/21 [phone]

## 2021-03-06 ENCOUNTER — Observation Stay (HOSPITAL_COMMUNITY): Payer: Medicare HMO

## 2021-03-06 DIAGNOSIS — R569 Unspecified convulsions: Secondary | ICD-10-CM

## 2021-03-06 DIAGNOSIS — G459 Transient cerebral ischemic attack, unspecified: Secondary | ICD-10-CM | POA: Diagnosis not present

## 2021-03-06 DIAGNOSIS — N179 Acute kidney failure, unspecified: Secondary | ICD-10-CM | POA: Diagnosis not present

## 2021-03-06 DIAGNOSIS — R4781 Slurred speech: Secondary | ICD-10-CM | POA: Diagnosis not present

## 2021-03-06 DIAGNOSIS — R251 Tremor, unspecified: Secondary | ICD-10-CM | POA: Diagnosis not present

## 2021-03-06 DIAGNOSIS — T426X1A Poisoning by other antiepileptic and sedative-hypnotic drugs, accidental (unintentional), initial encounter: Secondary | ICD-10-CM | POA: Diagnosis not present

## 2021-03-06 LAB — GLUCOSE, CAPILLARY
Glucose-Capillary: 89 mg/dL (ref 70–99)
Glucose-Capillary: 142 mg/dL — ABNORMAL HIGH (ref 70–99)
Glucose-Capillary: 153 mg/dL — ABNORMAL HIGH (ref 70–99)
Glucose-Capillary: 155 mg/dL — ABNORMAL HIGH (ref 70–99)

## 2021-03-06 LAB — IRON AND TIBC
Iron: 33 ug/dL — ABNORMAL LOW (ref 45–182)
Saturation Ratios: 11 % — ABNORMAL LOW (ref 17.9–39.5)
TIBC: 298 ug/dL (ref 250–450)
UIBC: 265 ug/dL

## 2021-03-06 LAB — HEMOGLOBIN A1C
Hgb A1c MFr Bld: 7.2 % — ABNORMAL HIGH (ref 4.8–5.6)
Mean Plasma Glucose: 159.94 mg/dL

## 2021-03-06 LAB — LIPID PANEL
Cholesterol: 152 mg/dL (ref 0–200)
HDL: 21 mg/dL — ABNORMAL LOW (ref >40)
LDL Cholesterol: 79 mg/dL (ref 0–99)
Total CHOL/HDL Ratio: 7.2 RATIO
Triglycerides: 260 mg/dL — ABNORMAL HIGH (ref <150)
VLDL: 52 mg/dL — ABNORMAL HIGH (ref 0–40)

## 2021-03-06 LAB — BASIC METABOLIC PANEL
Anion gap: 5 (ref 5–15)
BUN: 34 mg/dL — ABNORMAL HIGH (ref 8–23)
CO2: 22 mmol/L (ref 22–32)
Calcium: 9 mg/dL (ref 8.9–10.3)
Chloride: 108 mmol/L (ref 98–111)
Creatinine, Ser: 1.92 mg/dL — ABNORMAL HIGH (ref 0.61–1.24)
GFR, Estimated: 35 mL/min — ABNORMAL LOW (ref >60)
Glucose, Bld: 82 mg/dL (ref 70–99)
Potassium: 4.8 mmol/L (ref 3.5–5.1)
Sodium: 135 mmol/L (ref 135–145)

## 2021-03-06 LAB — FERRITIN: Ferritin: 142 ng/mL (ref 24–336)

## 2021-03-06 IMAGING — MR MR HEAD W/O CM
12 of 13 series · 44 of 48 positions shown · non-contrast
Comparison: Head CT [DATE]

CLINICAL DATA: Delirium.  Hand tremors and slurred speech.

EXAM:
MRI HEAD WITHOUT CONTRAST
TECHNIQUE: Multiplanar, multiecho pulse sequences of the brain and surrounding
structures were obtained without intravenous contrast.

[Series 5: DWI · axial · 3.0mm · 0.96mm/px · z∈[-75,+81]mm · 7 of 108 slices shown (1 of 4)]
[im 1/108]
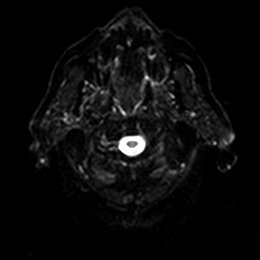
[im 18/108]
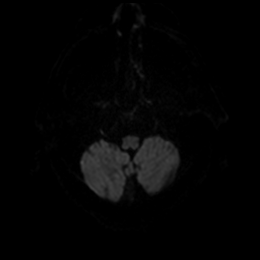
[im 36/108]
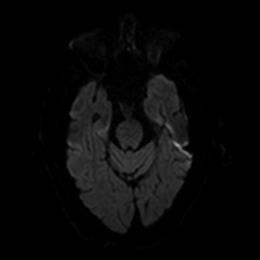
[im 54/108]
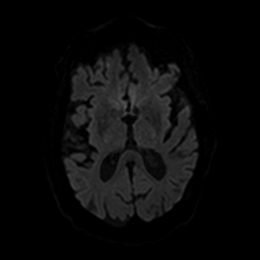
[im 72/108]
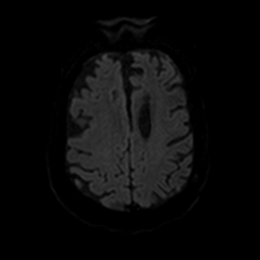
[im 90/108]
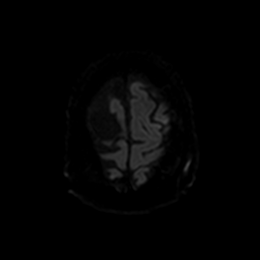
[im 108/108]
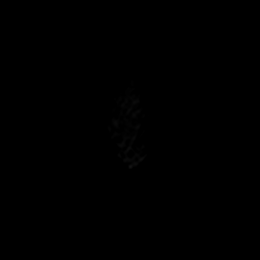

[Series 6: DWI · axial · 3.0mm · 0.96mm/px · z∈[-75,+81]mm · 4 of 54 slices shown (2 of 4)]
[im 1/54]
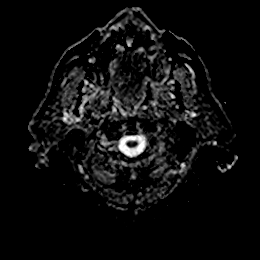
[im 18/54]
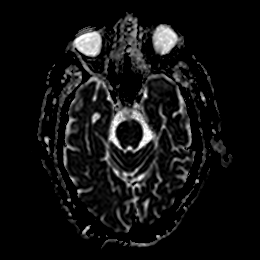
[im 36/54]
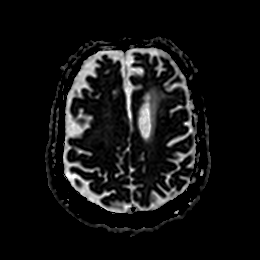
[im 54/54]
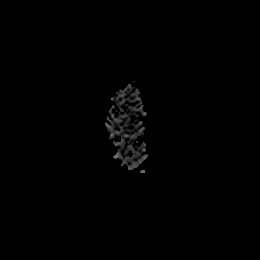

[Series 7: DWI · coronal · 4.0mm · 0.88mm/px · 6 of 80 slices shown (3 of 4)]
[im 1/80]
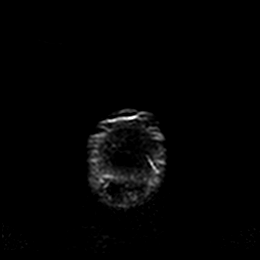
[im 16/80]
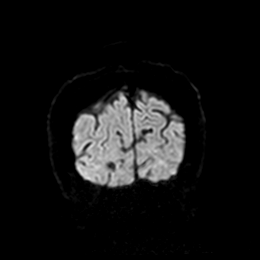
[im 32/80]
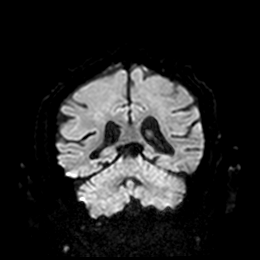
[im 48/80]
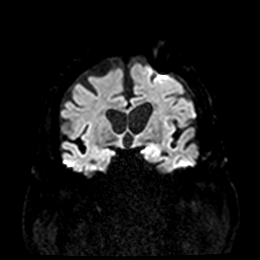
[im 64/80]
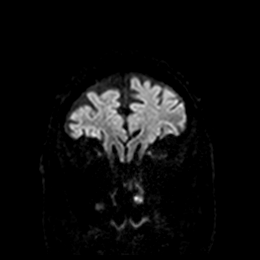
[im 80/80]
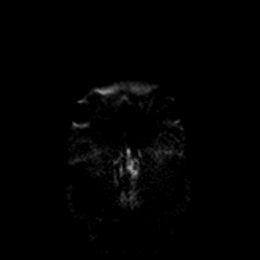

[Series 8: DWI · coronal · 4.0mm · 0.88mm/px · 3 of 40 slices shown (4 of 4)]
[im 1/40]
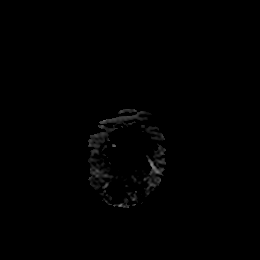
[im 20/40]
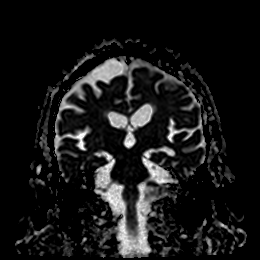
[im 40/40]
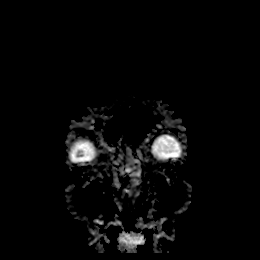

[Series 9: T1 · sagittal · 5.0mm · 0.75mm/px · 2 of 23 slices shown]
[im 1/23]
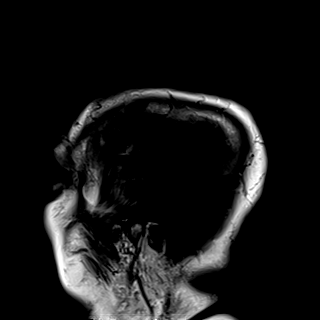
[im 23/23]
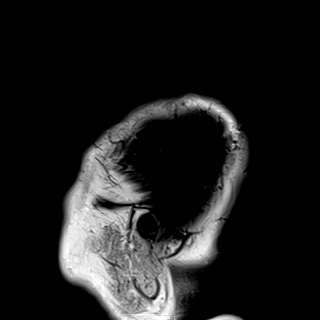

[Series 10: T2 · axial · 5.0mm · 0.72mm/px · z∈[-87,+96]mm · 2 of 32 slices shown (1 of 2)]
[im 1/32]
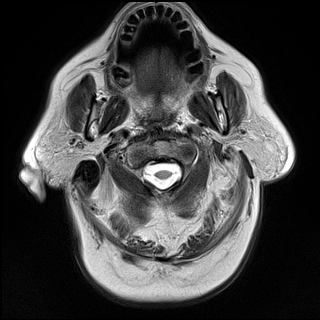
[im 32/32]
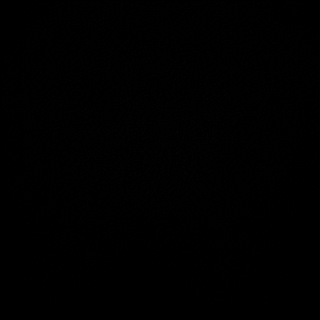

[Series 11: FLAIR · axial · 5.0mm · 0.45mm/px · z∈[-86,+96]mm · 2 of 32 slices shown]
[im 1/32]
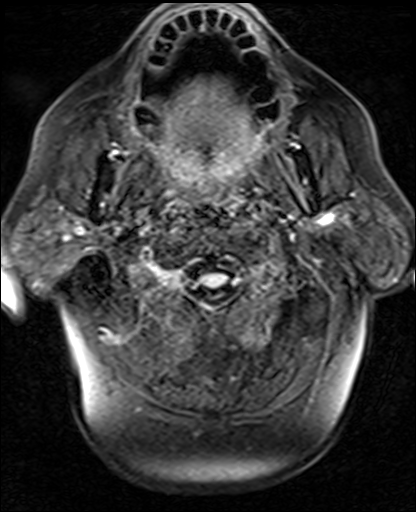
[im 32/32]
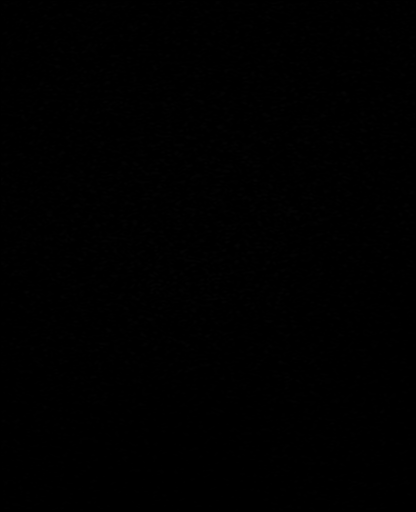

[Series 12: mag_images · axial · 3.0mm · 0.90mm/px · z∈[-65,+103]mm · 4 of 60 slices shown]
[im 1/60]
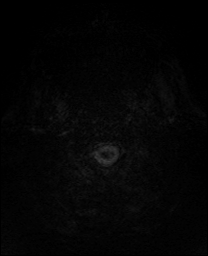
[im 20/60]
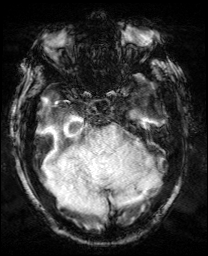
[im 40/60]
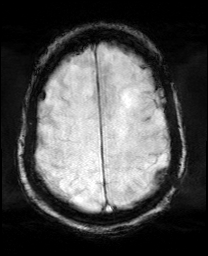
[im 60/60]
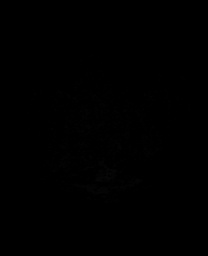

[Series 13: pha_images · axial · 3.0mm · 0.90mm/px · z∈[-65,+103]mm · 4 of 59 slices shown]
[im 1/59]
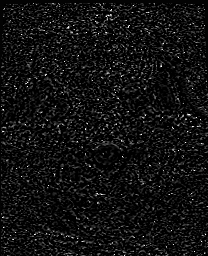
[im 20/59]
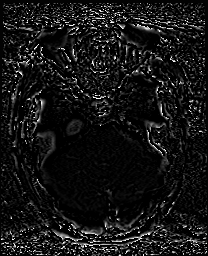
[im 39/59]
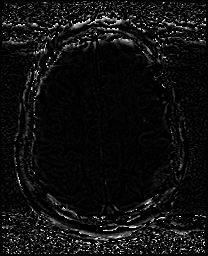
[im 59/59]
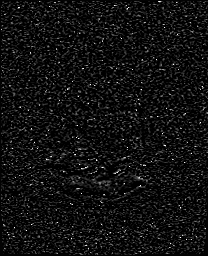

[Series 14: swi_images · axial · 3.0mm · 0.90mm/px · z∈[-65,+103]mm · 4 of 60 slices shown]
[im 1/60]
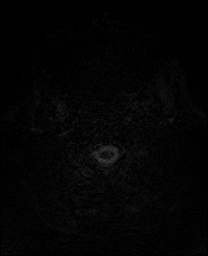
[im 20/60]
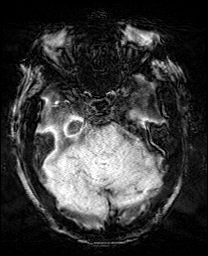
[im 40/60]
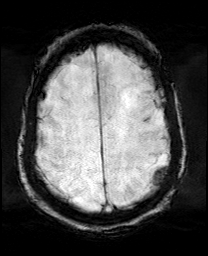
[im 60/60]
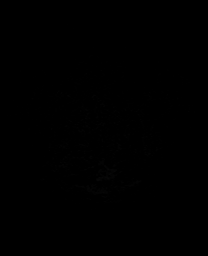

[Series 15: mip_images(sw) · axial · 24.0mm · 0.90mm/px · z∈[-55,+93]mm · 4 of 53 slices shown]
[im 1/53]
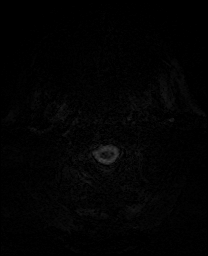
[im 18/53]
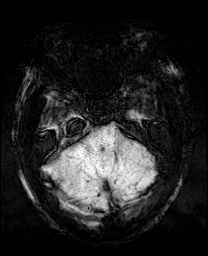
[im 35/53]
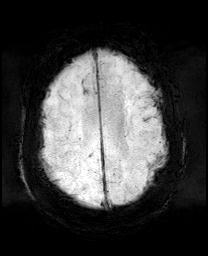
[im 53/53]
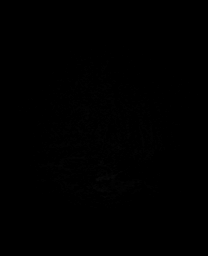

[Series 17: T2 · coronal · 5.0mm · 0.34mm/px · 2 of 29 slices shown (2 of 2)]
[im 1/29]
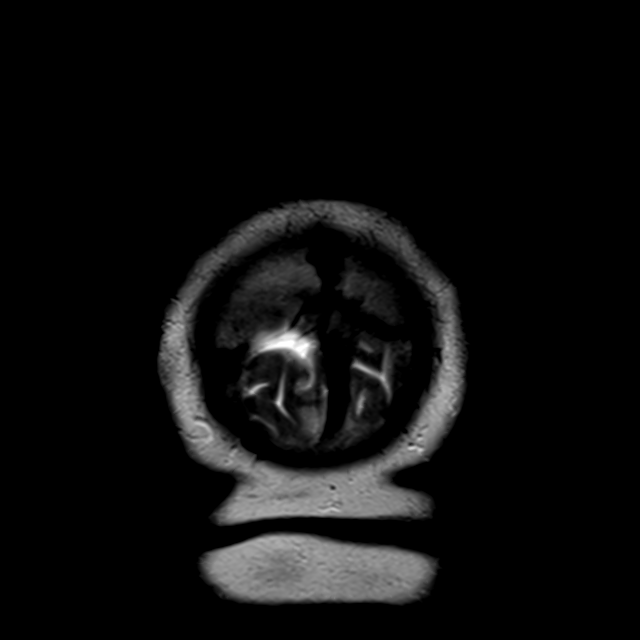
[im 29/29]
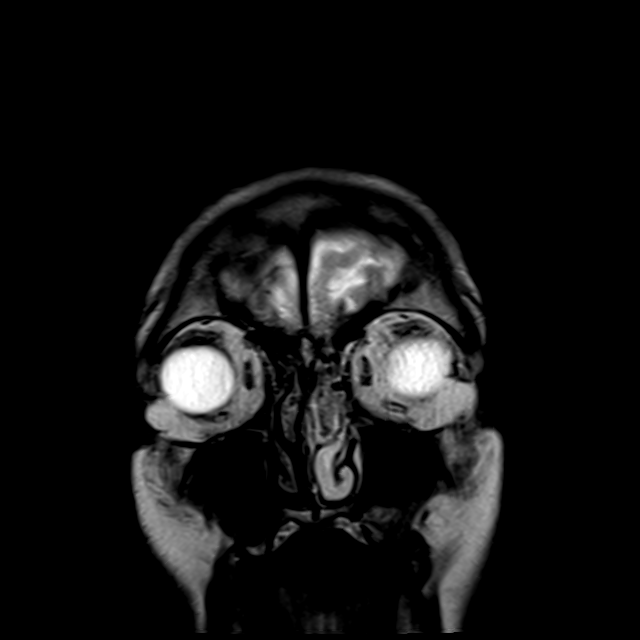

[44 of 48 positions shown; findings below may reference images not displayed]

FINDINGS: Brain: There is no evidence of an acute infarct, mass, or midline
shift. Sequelae of left-sided subdural hematoma evacuation are again
identified with suspected mild dural thickening over the cerebral
convexity but no significant residual subdural fluid collection.
There are a few chronic microhemorrhages in the right cerebral
hemisphere, and there is also mild superficial siderosis bilaterally
consistent with remote subarachnoid hemorrhage. Minimal hemosiderin
deposition is also noted in the occipital horn of the right lateral
ventricle.

A small chronic infarct anteriorly in the left frontal lobe is
unchanged. T2 hyperintensities elsewhere in the cerebral white
matter bilaterally and in the pons are nonspecific but compatible
with mild chronic small vessel ischemic disease. There is mild
cerebral atrophy. There may be a small incidental arachnoid cyst
over the posterior right frontal lobe near the vertex.

Vascular: Major intracranial vascular flow voids are preserved.

Skull and upper cervical spine: Left-sided craniotomy. No suspicious
marrow lesion.

Sinuses/Orbits: Unremarkable orbits. Paranasal sinuses and mastoid
air cells are clear.

Other: None.
IMPRESSION: 1. No acute intracranial abnormality.
2. Mild chronic small vessel ischemic disease.
3. Chronic left frontal infarct.

## 2021-03-06 MED ORDER — SODIUM CHLORIDE 0.9 % IV SOLN: INTRAVENOUS | Status: DC

## 2021-03-06 MED ORDER — GABAPENTIN 100 MG PO CAPS
200.0000 mg | ORAL_CAPSULE | Freq: Every day | ORAL | Status: DC
  Administered 2021-03-06 – 2021-03-09 (×4): 200 mg via ORAL
  Filled 2021-03-06 (×4): qty 2

## 2021-03-06 NOTE — Progress Notes (Addendum)
PROGRESS NOTE    Tommy Green  XTK:[id number] DOB: Dec 15, 1945 DOA: 03/05/2021 PCP: [name], DO    Brief Narrative:   75 y.o. male with medical history significant of rheumatoid arthritis, CKD stage II, HTN, IDDM, diabetic neuropathy, gout, presented with episode of aphasia, and worsening tremors.  Patient underwent lumbar fusion operation 3 weeks ago and was discharged to a rehab center.  Since her discharge from hospital, patient family found patient has had episodes of tremors, and visual hallucinations.  Short episodes that were self-limiting  Pt was placed on observation for possible TIA with ARF  Assessment & Plan:   Active Problems:   AKI (acute kidney injury) ([place])   TIA (transient ischemic attack)  Aphasia with TIA ruled out, likely gabapentin toxicity ruled in -MRI reviewed with patient. No acute process identified -Neurology had been following. Concerns made for possible gabapentin toxicity -Neuro recommendations for gradual tapering of gabapentin to eventually off -EEG performed and reviewed with Neurologist. Neg for seizures  Hyperkalemia -No EKG changes noted at presentation, 1 dose of Lokelma given -Potassium levels have normalized -repeat bmet in AM  Tremors -Likely compounding toxicity, management as above.  AKI on CKD stage II -Follows with urology as outpatient. -Renal US reviewed, unremarkable -Held HCTZ, ACEI, metformin. -Started amlodipine, start Januvia. -Continue IVF -Renal function is improving -Repeat bmet in AM. Good urine output thus far  IDDM with insulin resistance -Add on long-acting insulin given the AKI status.   -Continue SSI  HTN -BP stable and controlled -As above  RA -Fairly controlled, continue disease modification meds.  Gout -Hold colchicine -Avoid HCTZ per above   DVT prophylaxis: heparin subq Code Status: Full Family Communication: Pt in room, family not at bedside  Status is:  Observation  The patient remains OBS appropriate and will d/c before 2 midnights.  Dispo: The patient is from: SNF              Anticipated d/c is to: SNF              Patient currently is not medically stable to d/c.   Difficult to place patient No   Consultants:   Neurology  Procedures:   MRI  EEG  Renal US  Antimicrobials: Anti-infectives (From admission, onward)   None       Subjective: Reports feeling better today  Objective: Vitals:   03/05/21 2037 03/06/21 0937 03/06/21 1134 03/06/21 1626  BP: 126/68 134/72 112/65 137/64  Pulse: 77 72 77 77  Resp: 20  16   Temp:   98 F (36.7 C)   TempSrc: Oral     SpO2: 99%  97%     Intake/Output Summary (Last 24 hours) at 03/06/2021 1740 Last data filed at 03/06/2021 1620 Gross per 24 hour  Intake 1927.78 ml  Output 2550 ml  Net -622.22 ml   There were no vitals filed for this visit.  Examination: General exam: Awake, laying in bed, in nad, mucus membranes appear dry Respiratory system: Normal respiratory effort, no wheezing Cardiovascular system: regular rate, s1, s2 Gastrointestinal system: Soft, nondistended, positive BS Central nervous system: CN2-12 grossly intact, strength intact Extremities: Perfused, no clubbing Skin: Normal skin turgor, no notable skin lesions seen Psychiatry: Mood normal // no visual hallucinations   Data Reviewed: I have personally reviewed following labs and imaging studies  CBC: Recent Labs  Lab 03/05/21 0821  WBC 8.0  NEUTROABS 5.1  HGB 8.4*  HCT 27.2*  MCV 85.5  PLT 311  Basic Metabolic Panel: Recent Labs  Lab 03/05/21 0821 03/05/21 1942 03/06/21 0413  NA 136 134* 135  K 5.8* 5.3* 4.8  CL 106 107 108  CO2 22 22 22   GLUCOSE 90 129* 82  BUN 42* 37* 34*  CREATININE 2.20* 1.97* 1.92*  CALCIUM 8.9 8.8* 9.0  MG 1.9  --   --    GFR: CrCl cannot be calculated (Unknown ideal weight.). Liver Function Tests: No results for input(s): AST, ALT, ALKPHOS, BILITOT,  PROT, ALBUMIN in the last 168 hours. No results for input(s): LIPASE, AMYLASE in the last 168 hours. No results for input(s): AMMONIA in the last 168 hours. Coagulation Profile: Recent Labs  Lab 03/05/21 0821  INR 1.1   Cardiac Enzymes: No results for input(s): CKTOTAL, CKMB, CKMBINDEX, TROPONINI in the last 168 hours. BNP (last 3 results) No results for input(s): PROBNP in the last 8760 hours. HbA1C: Recent Labs    03/05/21 1331 03/06/21 0413  HGBA1C 7.3* 7.2*   CBG: Recent Labs  Lab 03/05/21 1854 03/05/21 2043 03/06/21 0736 03/06/21 1134 03/06/21 1518  GLUCAP 137* 103* 89 142* 153*   Lipid Profile: Recent Labs    03/06/21 0413  CHOL 152  HDL 21*  LDLCALC 79  TRIG 260*  CHOLHDL 7.2   Thyroid Function Tests: No results for input(s): TSH, T4TOTAL, FREET4, T3FREE, THYROIDAB in the last 72 hours. Anemia Panel: Recent Labs    03/05/21 1430 03/06/21 0413  FERRITIN  --  142  TIBC  --  298  IRON  --  33*  RETICCTPCT 1.4  --    Sepsis Labs: No results for input(s): PROCALCITON, LATICACIDVEN in the last 168 hours.  Recent Results (from the past 240 hour(s))  SARS CORONAVIRUS 2 (TAT 6-24 HRS) Nasopharyngeal Nasopharyngeal Swab     Status: None   Collection Time: 03/05/21  4:18 PM   Specimen: Nasopharyngeal Swab  Result Value Ref Range Status   SARS Coronavirus 2 NEGATIVE NEGATIVE Final    Comment: (NOTE) SARS-CoV-2 target nucleic acids are NOT DETECTED.  The SARS-CoV-2 RNA is generally detectable in upper and lower respiratory specimens during the acute phase of infection. Negative results do not preclude SARS-CoV-2 infection, do not rule out co-infections with other pathogens, and should not be used as the sole basis for treatment or other patient management decisions. Negative results must be combined with clinical observations, patient history, and epidemiological information. The expected result is Negative.  Fact Sheet for  Patients: SugarRoll.be  Fact Sheet for Healthcare Providers: https://www.woods-mathews.com/  This test is not yet approved or cleared by the [place] FDA and  has been authorized for detection and/or diagnosis of SARS-CoV-2 by FDA under an Emergency Use Authorization (EUA). This EUA will remain  in effect (meaning this test can be used) for the duration of the COVID-19 declaration under Se ction 564(b)(1) of the Act, 21 U.S.C. section 360bbb-3(b)(1), unless the authorization is terminated or revoked sooner.  Performed at [facility], [place] [address], [place], [place] [address]      Radiology Studies: CT Head Wo Contrast  Result Date: 03/05/2021 CLINICAL DATA:  Hand tremors and slurred speech EXAM: CT HEAD WITHOUT CONTRAST TECHNIQUE: Contiguous axial images were obtained from the base of the skull through the vertex without intravenous contrast. COMPARISON:  September 10, 2020 FINDINGS: Brain: There is mild diffuse atrophy with asymmetric atrophy in the right frontal and parietal regions compared to the left side, a finding stable from prior study. There is no  intracranial mass, hemorrhage, extra-axial fluid collection, or midline shift. There remains a linear dural area of increased opacity at the level of the craniotomy, likely due to previous subdural hematoma with repair and potential postoperative thickening. This appearance is stable compared to the prior study. There is not felt to be recurrence of subdural hematoma in this area based on the appearance of the prior study. There is evidence of a prior infarct in the left frontal lobe anterior and lateral to the anterior horn of the left lateral ventricle. Patchy periventricular small vessel disease also noted. No acute appearing infarct evident. Vascular: No appreciable hyperdense vessel. Calcification noted in the carotid siphon regions. Skull: Evidence of previous left frontal and  parietal craniotomy, unchanged. No new bone lesions. Sinuses/Orbits: Mucosal thickening in several ethmoid air cells noted. There is also mild mucosal thickening in the inferior left maxillary antrum. Orbits appear symmetric bilaterally. Other: Mastoid air cells clear. IMPRESSION: Asymmetric atrophy right frontal and parietal lobes compared to the left side, stable. Dural thickening on the left is stable compared to prior study, likely due to scarring/repair of dura at time of craniotomy on the left. No acute appearing extra-axial fluid collection evident. No mass or hemorrhage. There is evidence of a prior infarct in the left frontal lobe as well as patchy periventricular small vessel disease. No acute infarct evident. There are foci of arterial vascular calcification. Mucosal thickening noted in several paranasal sinus regions. Electronically Signed   By: Lowella Grip III M.D.   On: 03/05/2021 09:23   MR BRAIN WO CONTRAST  Result Date: 03/06/2021 CLINICAL DATA:  Delirium.  Hand tremors and slurred speech. EXAM: MRI HEAD WITHOUT CONTRAST TECHNIQUE: Multiplanar, multiecho pulse sequences of the brain and surrounding structures were obtained without intravenous contrast. COMPARISON:  Head CT 03/05/2021 FINDINGS: Brain: There is no evidence of an acute infarct, mass, or midline shift. Sequelae of left-sided subdural hematoma evacuation are again identified with suspected mild dural thickening over the cerebral convexity but no significant residual subdural fluid collection. There are a few chronic microhemorrhages in the right cerebral hemisphere, and there is also mild superficial siderosis bilaterally consistent with remote subarachnoid hemorrhage. Minimal hemosiderin deposition is also noted in the occipital horn of the right lateral ventricle. A small chronic infarct anteriorly in the left frontal lobe is unchanged. T2 hyperintensities elsewhere in the cerebral white matter bilaterally and in the pons are  nonspecific but compatible with mild chronic small vessel ischemic disease. There is mild cerebral atrophy. There may be a small incidental arachnoid cyst over the posterior right frontal lobe near the vertex. Vascular: Major intracranial vascular flow voids are preserved. Skull and upper cervical spine: Left-sided craniotomy. No suspicious marrow lesion. Sinuses/Orbits: Unremarkable orbits. Paranasal sinuses and mastoid air cells are clear. Other: None. IMPRESSION: 1. No acute intracranial abnormality. 2. Mild chronic small vessel ischemic disease. 3. Chronic left frontal infarct. Electronically Signed   By: Logan Bores M.D.   On: 03/06/2021 09:16   US RENAL  Result Date: 03/05/2021 CLINICAL DATA:  Acute kidney injury. EXAM: RENAL / URINARY TRACT ULTRASOUND COMPLETE COMPARISON:  None. FINDINGS: Right Kidney: Renal measurements: 11.0 cm x 6.7 cm x 5.3 cm = volume: 202.4 mL. Echogenicity within normal limits. No mass or hydronephrosis visualized. Left Kidney: Renal measurements: 12.7 cm x 7.0 cm x 7.4 cm = volume: 341.6 mL. Echogenicity within normal limits. No mass or hydronephrosis visualized. Bladder: Appears normal for degree of bladder distention. Other: None. IMPRESSION: Normal renal ultrasound. Electronically Signed  By: Virgina Norfolk M.D.   On: 03/05/2021 16:21   EEG adult  Result Date: 03/06/2021 Lora Havens, MD     03/06/2021  4:33 PM Patient Name: Tommy Green MRN: [id number] Epilepsy Attending: Lora Havens Referring Physician/Provider: Dr Amie Portland Date: 03/06/2021 Duration: 24.10 mins Patient history: 75yo M with L frontal stroe, SDH s/p left crani who presented with slurred speech tremors. EEG to evaluate for seizure Level of alertness: Awake AEDs during EEG study: GBP Technical aspects: This EEG study was done with scalp electrodes positioned according to the 10-20 International system of electrode placement. Electrical activity was acquired at a sampling rate of 500Hz  and  reviewed with a high frequency filter of 70Hz  and a low frequency filter of 1Hz . EEG data were recorded continuously and digitally stored. Description: The posterior dominant rhythm consists of 7.5 Hz activity of moderate voltage (25-35 uV) seen predominantly in posterior head regions, symmetric and reactive to eye opening and eye closing. EEG showed intermittent sharply contoured 3 to 6 Hz theta-delta slowing with overriding 15-18hz  beta activity in left fronto-centro-temporal region consistent with prior craniotomy. Continuous low amplitude 3-5 Hz theta-delta slowing was also noted in right frontemporal region. Hyperventilation and photic stimulation were not performed.   ABNORMALITY -Breach artifact, left fronto-centro-temporal region - Continuous slow, right frontemporal region. IMPRESSION: This study is suggestive of cortical dysfunction arising from right frontotemporal region likely secondary to underlying structural abnormality. Additionally, there is cortical dysfunction in left fronto-centro-temporal region consistent with prior craniotomy. No seizures or epileptiform discharges were seen throughout the recording. Priyanka Barbra Sarks    Scheduled Meds: . amLODipine  5 mg Oral Daily  . carvedilol  12.5 mg Oral BID WC  . fenofibrate  160 mg Oral Daily  . gabapentin  200 mg Oral QHS  . heparin  5,000 Units Subcutaneous Q12H  . insulin aspart  0-9 Units Subcutaneous TID WC  . insulin glargine  40 Units Subcutaneous QHS  . leflunomide  10 mg Oral Daily  . linagliptin  5 mg Oral Daily  . loratadine  10 mg Oral Daily  . pantoprazole  40 mg Oral Daily  . pravastatin  10 mg Oral q1800   Continuous Infusions:   LOS: 0 days   Marylu Lund, MD [facility] Pager On Amion  If 7PM-7AM, please contact night-coverage 03/06/2021, 5:40 PM

## 2021-03-06 NOTE — Procedures (Signed)
Patient Name: Tommy Green  MRN: [id number]  Epilepsy Attending: Lora Havens  Referring Physician/Provider: Dr Amie Portland Date: 03/06/2021 Duration: 24.10 mins  Patient history: 75yo M with L frontal stroe, SDH s/p left crani who presented with slurred speech tremors. EEG to evaluate for seizure  Level of alertness: Awake  AEDs during EEG study: GBP  Technical aspects: This EEG study was done with scalp electrodes positioned according to the 10-20 International system of electrode placement. Electrical activity was acquired at a sampling rate of 500Hz  and reviewed with a high frequency filter of 70Hz  and a low frequency filter of 1Hz . EEG data were recorded continuously and digitally stored.   Description: The posterior dominant rhythm consists of 7.5 Hz activity of moderate voltage (25-35 uV) seen predominantly in posterior head regions, symmetric and reactive to eye opening and eye closing. EEG showed intermittent sharply contoured 3 to 6 Hz theta-delta slowing with overriding 15-18hz  beta activity in left fronto-centro-temporal region consistent with prior craniotomy. Continuous low amplitude 3-5 Hz theta-delta slowing was also noted in right frontemporal region. Hyperventilation and photic stimulation were not performed.    ABNORMALITY -Breach artifact, left fronto-centro-temporal region - Continuous slow, right frontemporal region.   IMPRESSION: This study is suggestive of cortical dysfunction arising from right frontotemporal region likely secondary to underlying structural abnormality. Additionally, there is cortical dysfunction in left fronto-centro-temporal region consistent with prior craniotomy. No seizures or epileptiform discharges were seen throughout the recording.  Tunis Gentle Barbra Sarks

## 2021-03-06 NOTE — Progress Notes (Signed)
Physical Therapy Treatment Patient Details Name: Tommy Green MRN: [id number] DOB: 1945-12-29 Today's Date: 03/06/2021    History of Present Illness Pt is a 75 y/o male admitted from SNF secondary to speech difficulty and tremors in BUE and neck. Workup pending. CT of head negative for acute infarct. PMH includes recent lumbar surgery(4/18) with LLE weakness, subdural hematoma s/p craniotomy, rheumatoid arthritis, CKD stage II, HTN, IDDM, diabetic neuropathy, gout.    PT Comments    Pt with significant mobility impairments since back surgery one month ago and post op LLE weakness. Was unable to ambulate today and therapy has been working with pt at [facility] on this. Pt with poor awareness of the significance and severity of his deficits. Wife is much more aware of the severity of deficits. Continue to recommend return to SNF.   Follow Up Recommendations  SNF (return to [facility])     Financial risk analyst (measurements PT);Wheelchair cushion (measurements PT);Hospital bed;Other (comment) [name])    Recommendations for Other Services       Precautions / Restrictions Precautions Precautions: Back;Fall Precaution Booklet Issued: No Required Braces or Orthoses: Other Brace;Spinal Brace Spinal Brace: Lumbar corset;Applied in sitting position Other Brace: Has L AFO    Mobility  Bed Mobility Overal bed mobility: Needs Assistance Bed Mobility: Rolling;Sidelying to Sit Rolling: Min guard Sidelying to sit: +2 for physical assistance;Mod assist       General bed mobility comments: Assist to bring legs off of bed and elevate trunk into sitting    Transfers Overall transfer level: Needs assistance Equipment used: Rolling walker (2 wheeled);Ambulation equipment used Transfers: Sit to/from [facility] Sit to Stand: +2 physical assistance;Min assist;Mod assist Stand pivot transfers: +2 physical assistance;Mod assist       General transfer comment:  Assist to bring hips up and for balance. With walker pt required +2 mod assist to rise. With Stedy could come to standing with +2 min assist. Bed to chair with walker with stand pivot with assistant for balance and support. Used Stedy from chair to bsc to chair.  Ambulation/Gait Ambulation/Gait assistance: +2 physical assistance;Mod assist           General Gait Details: Attempted x 2 to stand from chair and amb with walker. Both times pt with significant posterior lean and bracing his legs on recliner. Unable to take any steps.   Stairs             Wheelchair Mobility    Modified Rankin (Stroke Patients Only)       Balance Overall balance assessment: Needs assistance Sitting-balance support: Feet supported;No upper extremity supported Sitting balance-Leahy Scale: Fair     Standing balance support: Bilateral upper extremity supported Standing balance-Leahy Scale: Poor Standing balance comment: Standing with walker with +51min to mod assist for static standing. Standing with Stedy with +2 min assist for static standing                            Cognition Arousal/Alertness: Awake/alert Behavior During Therapy: WFL for tasks assessed/performed Overall Cognitive Status: Impaired/Different from baseline Area of Impairment: Memory;Attention;Safety/judgement;Problem solving                   Current Attention Level: Selective Memory: Decreased short-term memory;Decreased recall of precautions   Safety/Judgement: Decreased awareness of safety;Decreased awareness of deficits   Problem Solving: Requires verbal cues;Requires tactile cues General Comments: Pt report he has been walking. When questioned further  and with talking to the wife pt has required significant assist to stand and go short distances with therapist. Pt thinks he can return home with wife.      Exercises      General Comments        Pertinent Vitals/Pain Pain Assessment:  Faces Faces Pain Scale: No hurt    Home Living                      Prior Function            PT Goals (current goals can now be found in the care plan section) Acute Rehab PT Goals Patient Stated Goal: to go home Progress towards PT goals: Progressing toward goals    Frequency    Min 2X/week      PT Plan      Co-evaluation              AM-PAC PT "6 Clicks" Mobility   Outcome Measure  Help needed turning from your back to your side while in a flat bed without using bedrails?: A Little Help needed moving from lying on your back to sitting on the side of a flat bed without using bedrails?: Total Help needed moving to and from a bed to a chair (including a wheelchair)?: Total Help needed standing up from a chair using your arms (e.g., wheelchair or bedside chair)?: Total Help needed to walk in hospital room?: Total Help needed climbing 3-5 steps with a railing? : Total 6 Click Score: 8    End of Session Equipment Utilized During Treatment: Gait belt;Back brace;Other (comment) (lt AFO) Activity Tolerance: Patient tolerated treatment well Patient left: with call bell/phone within reach;in chair;with chair alarm set;with family/visitor present (on stretcher in ED) Nurse Communication: Mobility status;Need for lift equipment (Nurse assist with [facility] for transfer) PT Visit Diagnosis: Unsteadiness on feet (R26.81);Muscle weakness (generalized) (M62.81);History of falling (Z91.81)     Time: 1320-1350 PT Time Calculation (min) (ACUTE ONLY): 30 min  Charges:  $Therapeutic Activity: 23-37 mins                     [place] Pager [phone] Office [place] 03/06/2021, 3:47 PM

## 2021-03-06 NOTE — Progress Notes (Signed)
EEG Completed; Results Pending  

## 2021-03-06 NOTE — Progress Notes (Addendum)
OT Cancellation Note  Patient Details Name: Tommy Green MRN: [id number] DOB: August 23, 1946   Cancelled Treatment:    Reason Eval/Treat Not Completed: Patient at procedure or test/ unavailable. Attempted 0830 this a.m. but patient off floor for MRI. Attempted again this a.m. at 0936 but back brace and AFO still unavailable. Will attempt again this afternoon once wife has delivered these items. Per RN, wife to arrive around 11:30am.   Addendum: Wife delivered AFO and LSO brace this afternoon. Attempted OT eval at 1355 but patient with another discipline. Will attempt 5/21.   Gloris Manchester OTR/L Supplemental OT, Department of rehab services [phone]  Runette Scifres R H. 03/06/2021, 9:42 AM

## 2021-03-06 NOTE — Progress Notes (Addendum)
NEUROLOGY PROGRESS NOTE  Subjective: No acute events overnight, patient states that he missed his breakfast due to being at MRI. He is doing well and states that his tremors have improved. Physical examination has improved in comparison to when he was last seen by neurology with no asterixis noted.   Exam: Vitals:   03/05/21 2000 03/05/21 2037  BP: 136/80 126/68  Pulse: 78 77  Resp: 20 20  Temp:    SpO2: 98% 99%   Physical Exam  Constitutional: Appears well-developed and well-nourished.  Cardiovascular: Normal rate and regular rhythm.  Respiratory: Effort normal, non-labored breathing  Neuro:  Mental Status: AAOx4, follows commands  Speech: Fluent with repetition and naming intact  Cranial Nerves: EOMI, VFF, Face Symmetric, Shoulder shrug intact, Tongue midline  Motor: Normal bulk and tone. BUE 5/5 LLE 3/5 RLE 4/5  Sensory: Intact to light touch throughout  Cerebellar: FNF intact  Gait: Deferred    Pertinent Imaging and Labs:  CT Head WO Contrast 03/05/21 Asymmetric atrophy right frontal and parietal lobes compared to the left side, stable. Dural thickening on the left is stable compared to prior study, likely due to scarring/repair of dura at time of craniotomy on the left. No acute appearing extra-axial fluid collection evident. No mass or hemorrhage. There is evidence of a prior infarct in the left frontal lobe as well as patchy periventricular small vessel disease. No acute infarct evident. There are foci of arterial vascular calcification. Mucosal thickening noted in several paranasal sinus regions.  MRI Brain WO Contrast 03/06/21 1. No acute intracranial abnormality. 2. Mild chronic small vessel ischemic disease. 3. Chronic left frontal infarct.  Routine EEG 03/06/21 Ordered AM 5/20, pending   Assessment:  Tommy Green is a 75 y.o. male past medical history of diabetes, history of prior L Frontal stroke, subdural hematoma status post left craniotomy evacuation 2021,  recent lumbar spinal fusion with residual left leg weakness who presents with slurred speech and tremors for which neurology was consulted. When first seen by neurology physical exam was significant for asterixis which has improved at this time.   Asterixis is felt to be in the setting of taking Neurontin w/impared renal function. This medication is known to cause myoclonic jerking in patients with renal impairment. For thorough work up to evaluate for other differentials including stroke versus seizure, MRI Brain was obtained and EEG. MRI Brain showed chronic left frontal stroke no new acute intracranial abnormalities. EEG is currently pending.   Recommend the following at this time:   - reduce gabapentin further - 200 TID and rest of the taper per PCP - with the goal of discontinuing it completely.  - Management of AKI on CKD by primary team  - Neurology will follow up EEG results, if negative will sign off   Ruta Hinds, NP  [facility] Nurse Practitioner  03/06/2021, 8:07 AM   Patient discussed with attending physician Dr. Rory Percy   Attending Neurohospitalist Addendum Patient seen and examined with APP/Resident. Agree with the history and physical as documented above. Agree with the plan as documented, which I helped formulate. I have independently reviewed the chart, obtained history, review of systems and examined the patient.I have personally reviewed pertinent head/neck/spine imaging (CT/MRI).  No asterixis.  Also improved renal function Taper down gabapentin to eventually discontinue Reduce to 200mg  -  3 times daily for now.  With outpatient follow-up, reduced on further with a goal to discontinue altogether. Will follow EEG-if negative, no further recommendations.  Addendum EEG with  no evidence of ongoing seizures There is evidence of cortical dysfunction in the right frontotemporal region as well as the left frontocentral temporal region from the prior craniotomy.   No seizures or epileptiform changes seen Plan relayed to Dr. Wyline Copas  Please feel free to call with any questions. --- Amie Portland, MD [facility] Pager: [phone]  If 7pm to 7am, please call on call as listed on AMION.

## 2021-03-06 NOTE — TOC Initial Note (Addendum)
Transition of Care [facility]) - Initial/Assessment Note    Patient Details  Name: Tommy Green MRN: [id number] Date of Birth: 1946/07/27  [facility] CM/SW Contact:    Angelita Ingles, RN Phone Number: [phone]  03/06/2021, 12:15 PM  Clinical Narrative:                 [facility] consulted for patient from [place]. CM at bedside to discuss disposition with patient. Patient is alert to self. Patient states that he has completed his therapy at [facility]. CM obtained verbal consent to speak with his wife. CM attempted to call wife with no answer. Message has been left for the wife. CM spoke with [name] at [place]. Per [name] patient has been at [facility] for several weeks but is still in need of therapy. Loree Fee states that patient has [employer] and she will need to start a new insurance authorization once therapy has seen and assessed the patient. Therapy evaluation pending currently waiting for wife to bring brace in before therapy can work with patient.   7 Wife at bedside, confirms that patient is to return to [facility] for rehab. PT assessment and EEG still pending. CM unable to complete FL2. Will complete after PT evaluation.   1615 CM received call from MD requesting to know if patient is able to return to [place] today. CM made MD aware that per [name] at [employer] authorization will need to be obtained but authorization can not be initiated until PT notes are in. Loree Fee states that she will initiate new authorization when PT notes are available. At the time that CM spoke with [name] at [place] the PT notes had not been entered. MD reports that notes are in now. CM attempted to call [name] but was unable to reach her. [name] reported previously that she will be on call this weekend number has been left in CM handoff.   Expected Discharge Plan: [place] Barriers to Discharge: Continued Medical Work up   Patient Goals and CMS  Choice Patient states their goals for this hospitalization and ongoing recovery are::  (Patient rambles does not answer question directly)      Expected Discharge Plan and Services Expected Discharge Plan: [place] In-house Referral: NA Discharge Planning Services: CM Consult   Living arrangements for the past 2 months: [place]                 DME Arranged: N/A DME Agency: NA       HH Arranged: NA [place] Agency: NA        Prior Living Arrangements/Services Living arrangements for the past 2 months: [place] Lives with:: Other (Comment) (From [employer]) Patient language and need for interpreter reviewed:: Yes Do you feel safe going back to the place where you live?: Yes      Need for Family Participation in Patient Care: Yes (Comment) Care giver support system in place?: Yes (comment)   Criminal Activity/Legal Involvement Pertinent to Current Situation/Hospitalization: No - Comment as needed  Activities of Daily Living      Permission Sought/Granted Permission sought to share information with : Facility Art therapist granted to share information with : Yes, Verbal Permission Granted  Share Information with NAME: Ronte Parker granted to share info w Relationship: spouse     Emotional Assessment Appearance:: Appears stated age Attitude/Demeanor/Rapport: Gracious (some confusion noted) Affect (typically observed): Pleasant Orientation: : Oriented to Self Alcohol /  Substance Use: Not Applicable Psych Involvement: No (comment)  Admission diagnosis:  TIA (transient ischemic attack) [G45.9] AKI (acute kidney injury) ([place]) [N17.9] Patient Active Problem List   Diagnosis Date Noted  . TIA (transient ischemic attack) 03/05/2021  . Body mass index (BMI) 36.0-36.9, adult 10/03/2020  . Subdural hemorrhage following injury without open intracranial wound and with prolonged loss of  consciousness (more than 24 hours) without return to pre-existing conscious level ([place]) 10/03/2020  . Sore throat 12/14/2019  . AKI (acute kidney injury) ([place])   . Respiratory failure ([place])   . Acute encephalopathy   . Hypernatremia   . Dyspnea   . Acute respiratory failure with hypoxemia ([place])   . SDH (subdural hematoma) ([place]) 10/24/2019  . Chronic pain of right knee 10/03/2019  . Cyclic vomiting syndrome 08/06/2019  . Neuropathy 12/25/2018  . Bilateral hip pain 08/24/2018  . AK (actinic keratosis) 08/24/2018  . Neoplasm of uncertain behavior 14/78/2956  . 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 Tendinopathy of right gluteus medius 07/13/2018  . Tendinopathy of left gluteus medius 07/13/2018  . Squamous cell carcinoma in situ (SCCIS) of skin 03/24/2018  . Skin lesion 03/17/2018  . Vitamin D deficiency 11/29/2017  . Stage 3 chronic kidney disease ([place]) 10/28/2017  . Primary osteoarthritis of right hip 09/11/2017  . Essential hypertension 07/18/2017  . Hyperlipidemia associated with type 2 diabetes mellitus ([place]) 07/18/2017  . Type 2 diabetes mellitus with diabetic neuropathy, unspecified ([place]) 07/18/2017  . Heart murmur 07/18/2017  . Hypertriglyceridemia 07/18/2017  . History of stroke 07/18/2017  . DDD (degenerative disc disease), lumbar 06/15/2017  . Iron deficiency anemia 06/15/2017  . Stroke ([place]) 06/15/2017  . Encounter for hepatitis C screening test for low risk patient 06/30/2016  . Anemia 06/19/2016  . Microalbuminuria due to type 2 diabetes mellitus ([place]) 03/19/2016  . Hypertrophy of inferior nasal turbinate 12/20/2015  . Nasal congestion 12/15/2015  . Disease of nasal cavity and sinuses 12/10/2015  . Degenerative tear of acetabular labrum of left hip 11/04/2015  . Cellulitis and abscess of left leg 09/25/2015  . History of tobacco abuse 05/15/2015  . Morbid (severe) obesity due to excess calories ([place]) 04/11/2015  . Disorder of both eustachian tubes 01/27/2015  .  Maxillary sinusitis 01/13/2015  . Lipoprotein deficiency 01/31/2014  . Gastro-esophageal reflux disease without esophagitis 11/02/2013  . Rheumatic fever 11/02/2013  . Erectile dysfunction associated with type 2 diabetes mellitus ([place]) 07/31/2013  . Pain in joint, ankle and foot 02/06/2013  . Type 2 diabetes mellitus with hyperglycemia, without long-term current use of insulin ([place]) 11/16/2012   PCP:  [name], DO Pharmacy:  No Pharmacies Listed    Social Determinants of Health (SDOH) Interventions    Readmission Risk Interventions No flowsheet data found.

## 2021-03-07 DIAGNOSIS — T426X1A Poisoning by other antiepileptic and sedative-hypnotic drugs, accidental (unintentional), initial encounter: Secondary | ICD-10-CM | POA: Diagnosis not present

## 2021-03-07 DIAGNOSIS — N179 Acute kidney failure, unspecified: Secondary | ICD-10-CM | POA: Diagnosis not present

## 2021-03-07 LAB — COMPREHENSIVE METABOLIC PANEL
ALT: 10 U/L (ref 0–44)
AST: 15 U/L (ref 15–41)
Albumin: 3 g/dL — ABNORMAL LOW (ref 3.5–5.0)
Alkaline Phosphatase: 49 U/L (ref 38–126)
Anion gap: 9 (ref 5–15)
BUN: 31 mg/dL — ABNORMAL HIGH (ref 8–23)
CO2: 20 mmol/L — ABNORMAL LOW (ref 22–32)
Calcium: 9.2 mg/dL (ref 8.9–10.3)
Chloride: 107 mmol/L (ref 98–111)
Creatinine, Ser: 1.87 mg/dL — ABNORMAL HIGH (ref 0.61–1.24)
GFR, Estimated: 37 mL/min — ABNORMAL LOW (ref >60)
Glucose, Bld: 140 mg/dL — ABNORMAL HIGH (ref 70–99)
Potassium: 4.9 mmol/L (ref 3.5–5.1)
Sodium: 136 mmol/L (ref 135–145)
Total Bilirubin: 0.4 mg/dL (ref 0.3–1.2)
Total Protein: 5.9 g/dL — ABNORMAL LOW (ref 6.5–8.1)

## 2021-03-07 LAB — GLUCOSE, CAPILLARY
Glucose-Capillary: 131 mg/dL — ABNORMAL HIGH (ref 70–99)
Glucose-Capillary: 161 mg/dL — ABNORMAL HIGH (ref 70–99)
Glucose-Capillary: 106 mg/dL — ABNORMAL HIGH (ref 70–99)

## 2021-03-07 NOTE — Progress Notes (Signed)
PROGRESS NOTE    Tommy Green  HKV:[id number] DOB: 01-27-1946 DOA: 03/05/2021 PCP: Tommy Pal, DO    Brief Narrative:   75 y.o. male with medical history significant of rheumatoid arthritis, CKD stage II, HTN, IDDM, diabetic neuropathy, gout, presented with episode of aphasia, and worsening tremors.  Patient underwent lumbar fusion operation 3 weeks ago and was discharged to a rehab center.  Since her discharge from hospital, patient family found patient has had episodes of tremors, and visual hallucinations.  Short episodes that were self-limiting  Pt was placed on observation for possible TIA with ARF  Assessment & Plan:   Active Problems:   AKI (acute kidney injury) ([place])   TIA (transient ischemic attack)  Aphasia with TIA ruled out, likely gabapentin toxicity ruled in -MRI reviewed with patient. No acute process identified -Neurology had been following. Concerns made for possible gabapentin toxicity, likely made worse with dehydration recently leading to ARF below -Neuro recommendations for gradual tapering of gabapentin to eventually off -EEG performed and reviewed with Neurologist. Neg for seizures -Mental status has improved   Hyperkalemia -No EKG changes noted at presentation, 1 dose of Lokelma given -Potassium levels have normalized -recheck bmet in AM  Tremors -Likely compounding toxicity, management as above.  AKI on CKD stage II -Follows with urology as outpatient. -Renal US reviewed, unremarkable -Held HCTZ, ACEI, metformin. -Started amlodipine, start Januvia. -Renal function is steadily improving with IVF, not quite yet at baseline -Repeat bmet in AM. Good urine output thus far -On exam, appears somewhat dehydrated still with dry mucus membranes. Have increased IVF to 100cc/hr. Anticipate improvement in renal function with adequate hydration -Advised family to encourage pt to stay hydrated  IDDM with insulin resistance -Add on  long-acting insulin given the AKI status.   -Continue SSI  HTN -BP stable and controlled -As above  RA -Fairly controlled, continue disease modification meds.  Gout -Hold colchicine -Avoid HCTZ per above   DVT prophylaxis: heparin subq Code Status: Full Family Communication: Pt in room, family is currently at bedside  Status is: Observation  The patient remains OBS appropriate and will d/c before 2 midnights.  Dispo: The patient is from: SNF              Anticipated d/c is to: SNF              Patient currently is not medically stable to d/c.   Difficult to place patient No   Consultants:   Neurology  Procedures:   MRI  EEG  Renal US  Antimicrobials: Anti-infectives (From admission, onward)   None      Subjective: States feeling better today. Still feels dehydrated  Objective: Vitals:   03/06/21 1134 03/06/21 1626 03/06/21 2050 03/07/21 0528  BP: 112/65 137/64 (!) 142/81 137/75  Pulse: 77 77 83 85  Resp: 16   20  Temp: 98 F (36.7 C)  98 F (36.7 C) 97.6 F (36.4 C)  TempSrc:    Oral  SpO2: 97%  98% 97%    Intake/Output Summary (Last 24 hours) at 03/07/2021 1532 Last data filed at 03/07/2021 5643 Gross per 24 hour  Intake 1687.78 ml  Output 2400 ml  Net -712.22 ml   There were no vitals filed for this visit.  Examination: General exam: Conversant, in no acute distress Respiratory system: normal chest rise, clear, no audible wheezing Cardiovascular system: regular rhythm, s1-s2 Gastrointestinal system: Nondistended, nontender, pos BS Central nervous system: No seizures, no tremors Extremities: No cyanosis,  no joint deformities Skin: No rashes, no pallor Psychiatry: Affect normal // no auditory hallucinations    Data Reviewed: I have personally reviewed following labs and imaging studies  CBC: Recent Labs  Lab 03/05/21 0821  WBC 8.0  NEUTROABS 5.1  HGB 8.4*  HCT 27.2*  MCV 85.5  PLT 470   Basic Metabolic Panel: Recent  Labs  Lab 03/05/21 0821 03/05/21 1942 03/06/21 0413 03/07/21 0251  NA 136 134* 135 136  K 5.8* 5.3* 4.8 4.9  CL 106 107 108 107  CO2 22 22 22  20*  GLUCOSE 90 129* 82 140*  BUN 42* 37* 34* 31*  CREATININE 2.20* 1.97* 1.92* 1.87*  CALCIUM 8.9 8.8* 9.0 9.2  MG 1.9  --   --   --    GFR: CrCl cannot be calculated (Unknown ideal weight.). Liver Function Tests: Recent Labs  Lab 03/07/21 0251  AST 15  ALT 10  ALKPHOS 49  BILITOT 0.4  PROT 5.9*  ALBUMIN 3.0*   No results for input(s): LIPASE, AMYLASE in the last 168 hours. No results for input(s): AMMONIA in the last 168 hours. Coagulation Profile: Recent Labs  Lab 03/05/21 0821  INR 1.1   Cardiac Enzymes: No results for input(s): CKTOTAL, CKMB, CKMBINDEX, TROPONINI in the last 168 hours. BNP (last 3 results) No results for input(s): PROBNP in the last 8760 hours. HbA1C: Recent Labs    03/05/21 1331 03/06/21 0413  HGBA1C 7.3* 7.2*   CBG: Recent Labs  Lab 03/06/21 0736 03/06/21 1134 03/06/21 1518 03/06/21 2002 03/07/21 0727  GLUCAP 89 142* 153* 155* 131*   Lipid Profile: Recent Labs    03/06/21 0413  CHOL 152  HDL 21*  LDLCALC 79  TRIG 260*  CHOLHDL 7.2   Thyroid Function Tests: No results for input(s): TSH, T4TOTAL, FREET4, T3FREE, THYROIDAB in the last 72 hours. Anemia Panel: Recent Labs    03/05/21 1430 03/06/21 0413  FERRITIN  --  142  TIBC  --  298  IRON  --  33*  RETICCTPCT 1.4  --    Sepsis Labs: No results for input(s): PROCALCITON, LATICACIDVEN in the last 168 hours.  Recent Results (from the past 240 hour(s))  SARS CORONAVIRUS 2 (TAT 6-24 HRS) Nasopharyngeal Nasopharyngeal Swab     Status: None   Collection Time: 03/05/21  4:18 PM   Specimen: Nasopharyngeal Swab  Result Value Ref Range Status   SARS Coronavirus 2 NEGATIVE NEGATIVE Final    Comment: (NOTE) SARS-CoV-2 target nucleic acids are NOT DETECTED.  The SARS-CoV-2 RNA is generally detectable in upper and  lower respiratory specimens during the acute phase of infection. Negative results do not preclude SARS-CoV-2 infection, do not rule out co-infections with other pathogens, and should not be used as the sole basis for treatment or other patient management decisions. Negative results must be combined with clinical observations, patient history, and epidemiological information. The expected result is Negative.  Fact Sheet for Patients: SugarRoll.be  Fact Sheet for Healthcare Providers: https://www.woods-mathews.com/  This test is not yet approved or cleared by the [place] FDA and  has been authorized for detection and/or diagnosis of SARS-CoV-2 by FDA under an Emergency Use Authorization (EUA). This EUA will remain  in effect (meaning this test can be used) for the duration of the COVID-19 declaration under Se ction 564(b)(1) of the Act, 21 U.S.C. section 360bbb-3(b)(1), unless the authorization is terminated or revoked sooner.  Performed at [facility], [place] [address], [place], [place] [address]  Radiology Studies: MR BRAIN WO CONTRAST  Result Date: 03/06/2021 CLINICAL DATA:  Delirium.  Hand tremors and slurred speech. EXAM: MRI HEAD WITHOUT CONTRAST TECHNIQUE: Multiplanar, multiecho pulse sequences of the brain and surrounding structures were obtained without intravenous contrast. COMPARISON:  Head CT 03/05/2021 FINDINGS: Brain: There is no evidence of an acute infarct, mass, or midline shift. Sequelae of left-sided subdural hematoma evacuation are again identified with suspected mild dural thickening over the cerebral convexity but no significant residual subdural fluid collection. There are a few chronic microhemorrhages in the right cerebral hemisphere, and there is also mild superficial siderosis bilaterally consistent with remote subarachnoid hemorrhage. Minimal hemosiderin deposition is also noted in the occipital horn of  the right lateral ventricle. A small chronic infarct anteriorly in the left frontal lobe is unchanged. T2 hyperintensities elsewhere in the cerebral white matter bilaterally and in the pons are nonspecific but compatible with mild chronic small vessel ischemic disease. There is mild cerebral atrophy. There may be a small incidental arachnoid cyst over the posterior right frontal lobe near the vertex. Vascular: Major intracranial vascular flow voids are preserved. Skull and upper cervical spine: Left-sided craniotomy. No suspicious marrow lesion. Sinuses/Orbits: Unremarkable orbits. Paranasal sinuses and mastoid air cells are clear. Other: None. IMPRESSION: 1. No acute intracranial abnormality. 2. Mild chronic small vessel ischemic disease. 3. Chronic left frontal infarct. Electronically Signed   By: Logan Bores M.D.   On: 03/06/2021 09:16   US RENAL  Result Date: 03/05/2021 CLINICAL DATA:  Acute kidney injury. EXAM: RENAL / URINARY TRACT ULTRASOUND COMPLETE COMPARISON:  None. FINDINGS: Right Kidney: Renal measurements: 11.0 cm x 6.7 cm x 5.3 cm = volume: 202.4 mL. Echogenicity within normal limits. No mass or hydronephrosis visualized. Left Kidney: Renal measurements: 12.7 cm x 7.0 cm x 7.4 cm = volume: 341.6 mL. Echogenicity within normal limits. No mass or hydronephrosis visualized. Bladder: Appears normal for degree of bladder distention. Other: None. IMPRESSION: Normal renal ultrasound. Electronically Signed   By: Virgina Norfolk M.D.   On: 03/05/2021 16:21   EEG adult  Result Date: 03/06/2021 Lora Havens, MD     03/06/2021  4:33 PM Patient Name: Tommy Green MRN: [id number] Epilepsy Attending: Lora Havens Referring Physician/Provider: Dr Amie Portland Date: 03/06/2021 Duration: 24.10 mins Patient history: 75yo M with L frontal stroe, SDH s/p left crani who presented with slurred speech tremors. EEG to evaluate for seizure Level of alertness: Awake AEDs during EEG study: GBP Technical  aspects: This EEG study was done with scalp electrodes positioned according to the 10-20 International system of electrode placement. Electrical activity was acquired at a sampling rate of 500Hz  and reviewed with a high frequency filter of 70Hz  and a low frequency filter of 1Hz . EEG data were recorded continuously and digitally stored. Description: The posterior dominant rhythm consists of 7.5 Hz activity of moderate voltage (25-35 uV) seen predominantly in posterior head regions, symmetric and reactive to eye opening and eye closing. EEG showed intermittent sharply contoured 3 to 6 Hz theta-delta slowing with overriding 15-18hz  beta activity in left fronto-centro-temporal region consistent with prior craniotomy. Continuous low amplitude 3-5 Hz theta-delta slowing was also noted in right frontemporal region. Hyperventilation and photic stimulation were not performed.   ABNORMALITY -Breach artifact, left fronto-centro-temporal region - Continuous slow, right frontemporal region. IMPRESSION: This study is suggestive of cortical dysfunction arising from right frontotemporal region likely secondary to underlying structural abnormality. Additionally, there is cortical dysfunction in left fronto-centro-temporal region consistent with prior craniotomy.  No seizures or epileptiform discharges were seen throughout the recording. Priyanka Barbra Sarks    Scheduled Meds: . amLODipine  5 mg Oral Daily  . carvedilol  12.5 mg Oral BID WC  . fenofibrate  160 mg Oral Daily  . gabapentin  200 mg Oral QHS  . heparin  5,000 Units Subcutaneous Q12H  . insulin aspart  0-9 Units Subcutaneous TID WC  . insulin glargine  40 Units Subcutaneous QHS  . leflunomide  10 mg Oral Daily  . linagliptin  5 mg Oral Daily  . loratadine  10 mg Oral Daily  . pantoprazole  40 mg Oral Daily  . pravastatin  10 mg Oral q1800   Continuous Infusions: . sodium chloride 75 mL/hr at 03/06/21 2104     LOS: 0 days   Marylu Lund, MD [facility] Pager On Amion  If 7PM-7AM, please contact night-coverage 03/07/2021, 3:32 PM

## 2021-03-07 NOTE — Progress Notes (Signed)
Pt's wife heard me praying and singing so she asked me if I would sing for her husband.  The chaplain offered caring and supportive presence, he sang and prayed for pt and wife.

## 2021-03-07 NOTE — Evaluation (Signed)
Occupational Therapy Evaluation Patient Details Name: Tommy Green MRN: [id number] DOB: 28-Nov-1945 Today's Date: 03/07/2021    History of Present Illness Pt is a 75 y/o male admitted from SNF secondary to speech difficulty and tremors in BUE and neck. Workup pending. CT of head negative for acute infarct. PMH includes recent lumbar surgery(4/18) with LLE weakness, subdural hematoma s/p craniotomy, rheumatoid arthritis, CKD stage II, HTN, IDDM, diabetic neuropathy, gout.   Clinical Impression   PTA patient was living with his wife in a private residence. Since recent lumbar surgery, patient has been undergoing short-term rehab at [facility] requiring assist with bed mobility, functional transfers, and ADLs. Patient currently functioning below baseline requiring +2 assist for ADLs and functional transfers with use of stedy. Patient also limited by deficits listed below including decreased cognition, decreased awareness of deficits, generalized weakness, and decreased activity tolerance and would benefit from continued acute OT services in prep for safe d/c back to SNF for short-term rehab.     Follow Up Recommendations  SNF;Supervision/Assistance - 24 hour    Equipment Recommendations  None recommended by OT (Defer to next level of care)    Recommendations for Other Services       Precautions / Restrictions Precautions Precautions: Back;Fall Precaution Booklet Issued: No Required Braces or Orthoses: Other Brace;Spinal Brace Spinal Brace: Lumbar corset;Applied in sitting position Other Brace: Has L AFO Restrictions Weight Bearing Restrictions: No      Mobility Bed Mobility Overal bed mobility: Needs Assistance Bed Mobility: Rolling;Sidelying to Sit Rolling: Min guard Sidelying to sit: Mod assist       General bed mobility comments: Patient able to advance RLE toward EOB but requires assist to advance LLE. With HOB >30 degrees, patient able to bring trunk upright with Mod A.     Transfers Overall transfer level: Needs assistance Equipment used: Rolling walker (2 wheeled);Ambulation equipment used Transfers: Sit to/from [facility] Sit to Stand: Min assist         General transfer comment: Min A for sit to stand from elevated EOB in stedy. Will attempt to progress to RW at time of next treatment session.    Balance Overall balance assessment: Needs assistance Sitting-balance support: Feet supported;No upper extremity supported Sitting balance-Leahy Scale: Fair                                     ADL either performed or assessed with clinical judgement   ADL Overall ADL's : Needs assistance/impaired     Grooming: Set up;Sitting Grooming Details (indicate cue type and reason): Completes grooming tasks in perched position in stedy at sink level with supervision A for safety. Occasional cues for sequencing.             Lower Body Dressing: Total assistance;Bed level   Toilet Transfer: Total assistance Toilet Transfer Details (indicate cue type and reason): Simulated with transfer to recliner with use of stedy. Unable to ambulate.         Functional mobility during ADLs: Total assistance General ADL Comments: Patient greatly limited by LLE weakness, decreased cognition, and need for +2 assist grossly for completion of ADLs.     Vision   Vision Assessment?: No apparent visual deficits     Perception     Praxis      Pertinent Vitals/Pain Pain Assessment: Faces Faces Pain Scale: No hurt Pain Intervention(s): Monitored during session     Hand  Dominance     Extremity/Trunk Assessment Upper Extremity Assessment Upper Extremity Assessment: Generalized weakness   Lower Extremity Assessment Lower Extremity Assessment: Defer to PT evaluation   Cervical / Trunk Assessment Cervical / Trunk Assessment: Other exceptions Cervical / Trunk Exceptions: recent lumbar surgery   Communication  Communication Communication: Expressive difficulties   Cognition Arousal/Alertness: Awake/alert Behavior During Therapy: WFL for tasks assessed/performed Overall Cognitive Status: Impaired/Different from baseline Area of Impairment: Memory;Attention;Safety/judgement;Problem solving                   Current Attention Level: Selective Memory: Decreased short-term memory;Decreased recall of precautions   Safety/Judgement: Decreased awareness of safety;Decreased awareness of deficits   Problem Solving: Requires verbal cues;Requires tactile cues General Comments: Patient is confused. States that he is upset from having to work alone all night without proper sleeping acccomodations. Patient believes that he is at work and is upset that he cannot go home. Emotionally labile (laughing one second, crying the next).   General Comments       Exercises     Shoulder Instructions      Home Living Family/patient expects to be discharged to:: Skilled nursing facility                                 Additional Comments: Was at [facility] for rehab      Prior Functioning/Environment Level of Independence: Needs assistance  Gait / Transfers Assistance Needed: Was working with PT on ambulation in parallel bars. Pt reports LLE buckles very easily. Otherwise is using WC and requires staff assist for transfers. ADL's / Homemaking Assistance Needed: Requires assist for ADL tasks. Reports +2 assist for bathing at shower level.            OT Problem List: Decreased strength;Decreased activity tolerance;Impaired balance (sitting and/or standing);Decreased cognition;Decreased safety awareness;Decreased knowledge of use of DME or AE;Decreased knowledge of precautions;Pain      OT Treatment/Interventions: Self-care/ADL training;Therapeutic exercise;Energy conservation;DME and/or AE instruction;Therapeutic activities;Cognitive remediation/compensation;Patient/family  education;Balance training    OT Goals(Current goals can be found in the care plan section) Acute Rehab OT Goals Patient Stated Goal: to go home OT Goal Formulation: With patient Time For Goal Achievement: 03/21/21 Potential to Achieve Goals: Fair ADL Goals Pt Will Perform Grooming: with set-up;sitting Pt Will Perform Upper Body Dressing: with set-up;sitting Pt Will Perform Lower Body Dressing: with mod assist;sit to/from stand;with adaptive equipment Pt Will Transfer to Toilet: with mod assist;bedside commode Pt Will Perform Toileting - Clothing Manipulation and hygiene: with mod assist;sitting/lateral leans;sit to/from stand Pt/caregiver will Perform Home Exercise Program: Both right and left upper extremity;With written HEP provided Additional ADL Goal #1: Patient will reall 3/3 back precautions in prep for ADLs.  OT Frequency: Min 2X/week   Barriers to D/C: Inaccessible home environment;Decreased caregiver support  Wife unable to provide current level of assist.       Co-evaluation              AM-PAC OT "6 Clicks" Daily Activity     Outcome Measure Help from another person eating meals?: A Little Help from another person taking care of personal grooming?: A Little Help from another person toileting, which includes using toliet, bedpan, or urinal?: Total Help from another person bathing (including washing, rinsing, drying)?: Total Help from another person to put on and taking off regular upper body clothing?: A Lot Help from another person to put on and taking  off regular lower body clothing?: Total 6 Click Score: 11   End of Session Equipment Utilized During Treatment: Gait belt;Back brace  Activity Tolerance: Patient tolerated treatment well Patient left: in chair;with call bell/phone within reach;with chair alarm set  OT Visit Diagnosis: Unsteadiness on feet (R26.81);Muscle weakness (generalized) (M62.81);Other symptoms and signs involving cognitive  function;Pain Pain - part of body:  (low back)                Time: 1610-9604 OT Time Calculation (min): 19 min Charges:  OT General Charges $OT Visit: 1 Visit OT Evaluation $OT Eval Moderate Complexity: 1 Mod  Yuri Flener H. OTR/L Supplemental OT, Department of rehab services [phone]  Josia Cueva R H. 03/07/2021, 9:55 AM

## 2021-03-07 NOTE — Plan of Care (Signed)
  Problem: Education: Goal: Knowledge of General Education information will improve Description: Including pain rating scale, medication(s)/side effects and non-pharmacologic comfort measures Outcome: Progressing   Problem: Health Behavior/Discharge Planning: Goal: Ability to manage health-related needs will improve Outcome: Progressing   Problem: Activity: Goal: Risk for activity intolerance will decrease Outcome: Progressing   Problem: Safety: Goal: Ability to remain free from injury will improve Outcome: Progressing   Problem: Pain Managment: Goal: General experience of comfort will improve Outcome: Progressing   Problem: Skin Integrity: Goal: Risk for impaired skin integrity will decrease Outcome: Progressing   Problem: Education: Goal: Knowledge of disease or condition will improve Outcome: Progressing   Problem: Ischemic Stroke/TIA Tissue Perfusion: Goal: Complications of ischemic stroke/TIA will be minimized Outcome: Progressing

## 2021-03-08 DIAGNOSIS — E1140 Type 2 diabetes mellitus with diabetic neuropathy, unspecified: Secondary | ICD-10-CM | POA: Diagnosis present

## 2021-03-08 DIAGNOSIS — E8881 Metabolic syndrome: Secondary | ICD-10-CM | POA: Diagnosis present

## 2021-03-08 DIAGNOSIS — Z811 Family history of alcohol abuse and dependence: Secondary | ICD-10-CM | POA: Diagnosis not present

## 2021-03-08 DIAGNOSIS — E875 Hyperkalemia: Secondary | ICD-10-CM | POA: Diagnosis present

## 2021-03-08 DIAGNOSIS — E1122 Type 2 diabetes mellitus with diabetic chronic kidney disease: Secondary | ICD-10-CM | POA: Diagnosis present

## 2021-03-08 DIAGNOSIS — E11649 Type 2 diabetes mellitus with hypoglycemia without coma: Secondary | ICD-10-CM | POA: Diagnosis not present

## 2021-03-08 DIAGNOSIS — I129 Hypertensive chronic kidney disease with stage 1 through stage 4 chronic kidney disease, or unspecified chronic kidney disease: Secondary | ICD-10-CM | POA: Diagnosis present

## 2021-03-08 DIAGNOSIS — Z981 Arthrodesis status: Secondary | ICD-10-CM | POA: Diagnosis not present

## 2021-03-08 DIAGNOSIS — E781 Pure hyperglyceridemia: Secondary | ICD-10-CM | POA: Diagnosis present

## 2021-03-08 DIAGNOSIS — E559 Vitamin D deficiency, unspecified: Secondary | ICD-10-CM | POA: Diagnosis present

## 2021-03-08 DIAGNOSIS — Z794 Long term (current) use of insulin: Secondary | ICD-10-CM | POA: Diagnosis not present

## 2021-03-08 DIAGNOSIS — R69 Illness, unspecified: Secondary | ICD-10-CM | POA: Diagnosis not present

## 2021-03-08 DIAGNOSIS — E860 Dehydration: Secondary | ICD-10-CM | POA: Diagnosis present

## 2021-03-08 DIAGNOSIS — Z7984 Long term (current) use of oral hypoglycemic drugs: Secondary | ICD-10-CM | POA: Diagnosis not present

## 2021-03-08 DIAGNOSIS — T426X1A Poisoning by other antiepileptic and sedative-hypnotic drugs, accidental (unintentional), initial encounter: Secondary | ICD-10-CM | POA: Diagnosis present

## 2021-03-08 DIAGNOSIS — Z87891 Personal history of nicotine dependence: Secondary | ICD-10-CM | POA: Diagnosis not present

## 2021-03-08 DIAGNOSIS — M109 Gout, unspecified: Secondary | ICD-10-CM | POA: Diagnosis present

## 2021-03-08 DIAGNOSIS — Z8673 Personal history of transient ischemic attack (TIA), and cerebral infarction without residual deficits: Secondary | ICD-10-CM | POA: Diagnosis not present

## 2021-03-08 DIAGNOSIS — M069 Rheumatoid arthritis, unspecified: Secondary | ICD-10-CM | POA: Diagnosis present

## 2021-03-08 DIAGNOSIS — R4701 Aphasia: Secondary | ICD-10-CM | POA: Diagnosis not present

## 2021-03-08 DIAGNOSIS — T426X5A Adverse effect of other antiepileptic and sedative-hypnotic drugs, initial encounter: Secondary | ICD-10-CM | POA: Diagnosis not present

## 2021-03-08 DIAGNOSIS — N182 Chronic kidney disease, stage 2 (mild): Secondary | ICD-10-CM | POA: Diagnosis present

## 2021-03-08 DIAGNOSIS — Z20822 Contact with and (suspected) exposure to covid-19: Secondary | ICD-10-CM | POA: Diagnosis not present

## 2021-03-08 DIAGNOSIS — Z79899 Other long term (current) drug therapy: Secondary | ICD-10-CM | POA: Diagnosis not present

## 2021-03-08 DIAGNOSIS — K219 Gastro-esophageal reflux disease without esophagitis: Secondary | ICD-10-CM | POA: Diagnosis not present

## 2021-03-08 DIAGNOSIS — N179 Acute kidney failure, unspecified: Secondary | ICD-10-CM | POA: Diagnosis not present

## 2021-03-08 LAB — COMPREHENSIVE METABOLIC PANEL
ALT: 12 U/L (ref 0–44)
AST: 13 U/L — ABNORMAL LOW (ref 15–41)
Albumin: 2.9 g/dL — ABNORMAL LOW (ref 3.5–5.0)
Alkaline Phosphatase: 45 U/L (ref 38–126)
Anion gap: 7 (ref 5–15)
BUN: 28 mg/dL — ABNORMAL HIGH (ref 8–23)
CO2: 21 mmol/L — ABNORMAL LOW (ref 22–32)
Calcium: 8.7 mg/dL — ABNORMAL LOW (ref 8.9–10.3)
Chloride: 107 mmol/L (ref 98–111)
Creatinine, Ser: 1.54 mg/dL — ABNORMAL HIGH (ref 0.61–1.24)
GFR, Estimated: 46 mL/min — ABNORMAL LOW (ref >60)
Glucose, Bld: 120 mg/dL — ABNORMAL HIGH (ref 70–99)
Potassium: 3.7 mmol/L (ref 3.5–5.1)
Sodium: 135 mmol/L (ref 135–145)
Total Bilirubin: 0.6 mg/dL (ref 0.3–1.2)
Total Protein: 5.6 g/dL — ABNORMAL LOW (ref 6.5–8.1)

## 2021-03-08 LAB — GLUCOSE, CAPILLARY
Glucose-Capillary: 83 mg/dL (ref 70–99)
Glucose-Capillary: 132 mg/dL — ABNORMAL HIGH (ref 70–99)
Glucose-Capillary: 119 mg/dL — ABNORMAL HIGH (ref 70–99)
Glucose-Capillary: 126 mg/dL — ABNORMAL HIGH (ref 70–99)

## 2021-03-08 MED ORDER — LIP MEDEX EX OINT
TOPICAL_OINTMENT | CUTANEOUS | Status: DC | PRN
  Filled 2021-03-08: qty 7

## 2021-03-08 NOTE — Progress Notes (Signed)
PROGRESS NOTE    Tommy Green  RKY:[id number] DOB: 1946-02-07 DOA: 03/05/2021 PCP: [name], DO    Brief Narrative:   75 y.o. male with medical history significant of rheumatoid arthritis, CKD stage II, HTN, IDDM, diabetic neuropathy, gout, presented with episode of aphasia, and worsening tremors.  Patient underwent lumbar fusion operation 3 weeks ago and was discharged to a rehab center.  Since her discharge from hospital, patient family found patient has had episodes of tremors, and visual hallucinations.  Short episodes that were self-limiting  Pt was placed on observation for possible TIA with ARF  Assessment & Plan:   Active Problems:   AKI (acute kidney injury) ([place])   TIA (transient ischemic attack)   Gabapentin overdose, accidental or unintentional, initial encounter  Aphasia with TIA ruled out, likely gabapentin toxicity ruled in -MRI reviewed with patient. No acute process identified -Neurology had been following. Concerns made for possible gabapentin toxicity, likely made worse with dehydration recently leading to ARF below -Neuro recommendations for gradual tapering of gabapentin to eventually off -EEG performed and reviewed with Neurologist. Neg for seizures -mental status is gradually improving as renal function improves   Hyperkalemia -No EKG changes noted at presentation, 1 dose of Lokelma given -Potassium levels have normalized -recheck bmet in AM  Tremors -resolved -Suspected secondary to gabapentin toxicity -Cont to wean gabapentin to off  AKI on CKD stage II -Follows with urology as outpatient. -Renal US reviewed, unremarkable -Held HCTZ, ACEI, metformin. -Started amlodipine, start Januvia. -Renal function is steadily improving with IVF, not quite yet at baseline -Repeat bmet in AM. Good urine output thus far -On exam, mucus membranes remain dry, but is improving. Have continued IVF. Anticipate improvement in renal function once  hydrated -Advised family to encourage pt to stay hydrated  IDDM with insulin resistance -Add on long-acting insulin given the AKI status.   -Continue SSI  HTN -BP stable and controlled -As above  RA -Fairly controlled, continue disease modification meds.  Gout -Hold colchicine -Avoid HCTZ per above  DVT prophylaxis: heparin subq Code Status: Full Family Communication: Pt in room, family is currently at bedside  Status is: Observation  The patient will require care spanning > 2 midnights and should be moved to inpatient because: Inpatient level of care appropriate due to severity of illness  Dispo: The patient is from: SNF              Anticipated d/c is to: SNF              Patient currently is not medically stable to d/c.   Difficult to place patient No   Consultants:   Neurology  Procedures:   MRI  EEG  Renal US  Antimicrobials: Anti-infectives (From admission, onward)   None      Subjective: Still feeling dehydrated but improved  Objective: Vitals:   03/07/21 2331 03/08/21 0511 03/08/21 0511 03/08/21 1222  BP: 129/70 (!) 141/80 (!) 141/80 127/76  Pulse: 79 78 78 83  Resp: 20 19  16   Temp: 98.4 F (36.9 C) 98.7 F (37.1 C) 98.7 F (37.1 C) 98.6 F (37 C)  TempSrc: Oral Oral    SpO2: 92% 99% 99% 95%    Intake/Output Summary (Last 24 hours) at 03/08/2021 1618 Last data filed at 03/08/2021 3151 Gross per 24 hour  Intake 2735.94 ml  Output 2350 ml  Net 385.94 ml   There were no vitals filed for this visit.  Examination: General exam: Awake, laying in  bed, in nad, mucus membranes slightly dry but is improving Respiratory system: Normal respiratory effort, no wheezing Cardiovascular system: regular rate, s1, s2 Gastrointestinal system: Soft, nondistended, positive BS Central nervous system: CN2-12 grossly intact, strength intact Extremities: Perfused, no clubbing Skin: Normal skin turgor, no notable skin lesions seen Psychiatry: Mood  normal // no visual hallucinations   Data Reviewed: I have personally reviewed following labs and imaging studies  CBC: Recent Labs  Lab 03/05/21 0821  WBC 8.0  NEUTROABS 5.1  HGB 8.4*  HCT 27.2*  MCV 85.5  PLT 676   Basic Metabolic Panel: Recent Labs  Lab 03/05/21 0821 03/05/21 1942 03/06/21 0413 03/07/21 0251 03/08/21 0021  NA 136 134* 135 136 135  K 5.8* 5.3* 4.8 4.9 3.7  CL 106 107 108 107 107  CO2 22 22 22  20* 21*  GLUCOSE 90 129* 82 140* 120*  BUN 42* 37* 34* 31* 28*  CREATININE 2.20* 1.97* 1.92* 1.87* 1.54*  CALCIUM 8.9 8.8* 9.0 9.2 8.7*  MG 1.9  --   --   --   --    GFR: CrCl cannot be calculated (Unknown ideal weight.). Liver Function Tests: Recent Labs  Lab 03/07/21 0251 03/08/21 0021  AST 15 13*  ALT 10 12  ALKPHOS 49 45  BILITOT 0.4 0.6  PROT 5.9* 5.6*  ALBUMIN 3.0* 2.9*   No results for input(s): LIPASE, AMYLASE in the last 168 hours. No results for input(s): AMMONIA in the last 168 hours. Coagulation Profile: Recent Labs  Lab 03/05/21 0821  INR 1.1   Cardiac Enzymes: No results for input(s): CKTOTAL, CKMB, CKMBINDEX, TROPONINI in the last 168 hours. BNP (last 3 results) No results for input(s): PROBNP in the last 8760 hours. HbA1C: Recent Labs    03/06/21 0413  HGBA1C 7.2*   CBG: Recent Labs  Lab 03/07/21 0727 03/07/21 1539 03/07/21 2138 03/08/21 0815 03/08/21 1220  GLUCAP 131* 161* 106* 83 132*   Lipid Profile: Recent Labs    03/06/21 0413  CHOL 152  HDL 21*  LDLCALC 79  TRIG 260*  CHOLHDL 7.2   Thyroid Function Tests: No results for input(s): TSH, T4TOTAL, FREET4, T3FREE, THYROIDAB in the last 72 hours. Anemia Panel: Recent Labs    03/06/21 0413  FERRITIN 142  TIBC 298  IRON 33*   Sepsis Labs: No results for input(s): PROCALCITON, LATICACIDVEN in the last 168 hours.  Recent Results (from the past 240 hour(s))  SARS CORONAVIRUS 2 (TAT 6-24 HRS) Nasopharyngeal Nasopharyngeal Swab     Status: None    Collection Time: 03/05/21  4:18 PM   Specimen: Nasopharyngeal Swab  Result Value Ref Range Status   SARS Coronavirus 2 NEGATIVE NEGATIVE Final    Comment: (NOTE) SARS-CoV-2 target nucleic acids are NOT DETECTED.  The SARS-CoV-2 RNA is generally detectable in upper and lower respiratory specimens during the acute phase of infection. Negative results do not preclude SARS-CoV-2 infection, do not rule out co-infections with other pathogens, and should not be used as the sole basis for treatment or other patient management decisions. Negative results must be combined with clinical observations, patient history, and epidemiological information. The expected result is Negative.  Fact Sheet for Patients: SugarRoll.be  Fact Sheet for Healthcare Providers: https://www.woods-mathews.com/  This test is not yet approved or cleared by the [place] FDA and  has been authorized for detection and/or diagnosis of SARS-CoV-2 by FDA under an Emergency Use Authorization (EUA). This EUA will remain  in effect (meaning this test can be  used) for the duration of the COVID-19 declaration under Se ction 564(b)(1) of the Act, 21 U.S.C. section 360bbb-3(b)(1), unless the authorization is terminated or revoked sooner.  Performed at [facility], [place] [address], [place], [place] [address]      Radiology Studies: EEG adult  Result Date: Mar 20, 2021 Lora Havens, MD     03-20-21  4:33 PM Patient Name: Tommy Green MRN: [id number] Epilepsy Attending: Lora Havens Referring Physician/Provider: Dr Amie [place] Date: 03-20-2021 Duration: 24.10 mins Patient history: 75yo M with L frontal stroe, SDH s/p left crani who presented with slurred speech tremors. EEG to evaluate for seizure Level of alertness: Awake AEDs during EEG study: GBP Technical aspects: This EEG study was done with scalp electrodes positioned according to the 10-20 International system  of electrode placement. Electrical activity was acquired at a sampling rate of 500Hz  and reviewed with a high frequency filter of 70Hz  and a low frequency filter of 1Hz . EEG data were recorded continuously and digitally stored. Description: The posterior dominant rhythm consists of 7.5 Hz activity of moderate voltage (25-35 uV) seen predominantly in posterior head regions, symmetric and reactive to eye opening and eye closing. EEG showed intermittent sharply contoured 3 to 6 Hz theta-delta slowing with overriding 15-18hz  beta activity in left fronto-centro-temporal region consistent with prior craniotomy. Continuous low amplitude 3-5 Hz theta-delta slowing was also noted in right frontemporal region. Hyperventilation and photic stimulation were not performed.   ABNORMALITY -Breach artifact, left fronto-centro-temporal region - Continuous slow, right frontemporal region. IMPRESSION: This study is suggestive of cortical dysfunction arising from right frontotemporal region likely secondary to underlying structural abnormality. Additionally, there is cortical dysfunction in left fronto-centro-temporal region consistent with prior craniotomy. No seizures or epileptiform discharges were seen throughout the recording. Priyanka Barbra Sarks    Scheduled Meds: . amLODipine  5 mg Oral Daily  . carvedilol  12.5 mg Oral BID WC  . fenofibrate  160 mg Oral Daily  . gabapentin  200 mg Oral QHS  . heparin  5,000 Units Subcutaneous Q12H  . insulin aspart  0-9 Units Subcutaneous TID WC  . insulin glargine  40 Units Subcutaneous QHS  . leflunomide  10 mg Oral Daily  . linagliptin  5 mg Oral Daily  . loratadine  10 mg Oral Daily  . pantoprazole  40 mg Oral Daily  . pravastatin  10 mg Oral q1800   Continuous Infusions: . sodium chloride 100 mL/hr at 03/08/21 0600     LOS: 0 days   Marylu Lund, MD [facility] Pager On Amion  If 7PM-7AM, please contact night-coverage 03/08/2021, 4:18 PM

## 2021-03-08 NOTE — TOC Progression Note (Signed)
Transition of Care [facility]) - Progression Note    Patient Details  Name: Tommy Green MRN: [id number] Date of Birth: [birth year]  Transition of Care [facility]) CM/SW [place], [place] Phone Number: 03/08/2021, 10:05 AM  Clinical Narrative:   CSW sent therapy notes to [facility] for authorization request. CSW to follow.    Expected Discharge Plan: [place] Barriers to Discharge: Continued Medical Work up  Expected Discharge Plan and Services Expected Discharge Plan: [place] In-house Referral: NA Discharge Planning Services: CM Consult   Living arrangements for the past 2 months: [place]                 DME Arranged: N/A DME Agency: NA       HH Arranged: NA HH Agency: NA         Social Determinants of Health (SDOH) Interventions    Readmission Risk Interventions No flowsheet data found.

## 2021-03-09 DIAGNOSIS — T426X1A Poisoning by other antiepileptic and sedative-hypnotic drugs, accidental (unintentional), initial encounter: Secondary | ICD-10-CM | POA: Diagnosis not present

## 2021-03-09 DIAGNOSIS — N179 Acute kidney failure, unspecified: Secondary | ICD-10-CM | POA: Diagnosis not present

## 2021-03-09 LAB — COMPREHENSIVE METABOLIC PANEL
ALT: 12 U/L (ref 0–44)
AST: 15 U/L (ref 15–41)
Albumin: 2.8 g/dL — ABNORMAL LOW (ref 3.5–5.0)
Alkaline Phosphatase: 44 U/L (ref 38–126)
Anion gap: 6 (ref 5–15)
BUN: 19 mg/dL (ref 8–23)
CO2: 20 mmol/L — ABNORMAL LOW (ref 22–32)
Calcium: 8.9 mg/dL (ref 8.9–10.3)
Chloride: 111 mmol/L (ref 98–111)
Creatinine, Ser: 1.29 mg/dL — ABNORMAL HIGH (ref 0.61–1.24)
GFR, Estimated: 57 mL/min — ABNORMAL LOW (ref >60)
Glucose, Bld: 53 mg/dL — ABNORMAL LOW (ref 70–99)
Potassium: 3.8 mmol/L (ref 3.5–5.1)
Sodium: 137 mmol/L (ref 135–145)
Total Bilirubin: 0.8 mg/dL (ref 0.3–1.2)
Total Protein: 5.8 g/dL — ABNORMAL LOW (ref 6.5–8.1)

## 2021-03-09 LAB — GLUCOSE, CAPILLARY
Glucose-Capillary: 40 mg/dL — CL (ref 70–99)
Glucose-Capillary: 58 mg/dL — ABNORMAL LOW (ref 70–99)
Glucose-Capillary: 169 mg/dL — ABNORMAL HIGH (ref 70–99)
Glucose-Capillary: 129 mg/dL — ABNORMAL HIGH (ref 70–99)
Glucose-Capillary: 127 mg/dL — ABNORMAL HIGH (ref 70–99)
Glucose-Capillary: 162 mg/dL — ABNORMAL HIGH (ref 70–99)

## 2021-03-09 LAB — SARS CORONAVIRUS 2 (TAT 6-24 HRS): SARS Coronavirus 2: NEGATIVE

## 2021-03-09 MED ORDER — DEXTROSE 50 % IV SOLN
INTRAVENOUS | Status: AC
  Administered 2021-03-09: 50 mL
  Filled 2021-03-09: qty 50

## 2021-03-09 MED ORDER — QUETIAPINE FUMARATE 25 MG PO TABS
25.0000 mg | ORAL_TABLET | Freq: Every day | ORAL | Status: DC
  Administered 2021-03-09: 25 mg via ORAL
  Filled 2021-03-09: qty 1

## 2021-03-09 MED ORDER — INSULIN GLARGINE 100 UNIT/ML ~~LOC~~ SOLN
30.0000 [IU] | Freq: Every day | SUBCUTANEOUS | Status: DC
  Administered 2021-03-09: 30 [IU] via SUBCUTANEOUS
  Filled 2021-03-09 (×2): qty 0.3

## 2021-03-09 NOTE — Progress Notes (Signed)
PT Cancellation Note  Patient Details Name: Tommy Green MRN: [id number] DOB: 09/06/1946   Cancelled Treatment:    Reason Eval/Treat Not Completed: Other (comment)  Attempted to see patient and he reported he was told he is going to [place] today and his wife packed up his braces and took them with her. Patient stated he did not feel comfortable doing anything without his brace--that his doctors had warned him not to. Patient has had complicated post-op course and agreed we should not try to mobilize without brace.    Arby Barrette, PT Pager [phone]   Rexanne Mano 03/09/2021, 4:38 PM

## 2021-03-09 NOTE — TOC Progression Note (Addendum)
Transition of Care [facility]) - Progression Note    Patient Details  Name: Tommy Green MRN: [id number] Date of Birth: 05-Apr-1946  Transition of Care [facility]) CM/SW [place], RN Phone Number: [phone]  03/09/2021, 3:41 PM  Clinical Narrative:    2376 TOC continues to follow. Awaiting insurance auth. Per [name] at [employer] is still pending but Josem Kaufmann has been requested to start today. CM has requested Covid test.   1520 CM received message from [place] at stating that the insurance company wants an updated PT note. Message has been sent to PT.    Expected Discharge Plan: [place] Barriers to Discharge: Continued Medical Work up  Expected Discharge Plan and Services Expected Discharge Plan: [place] In-house Referral: NA Discharge Planning Services: CM Consult   Living arrangements for the past 2 months: [place]                 DME Arranged: N/A DME Agency: NA       HH Arranged: NA HH Agency: NA         Social Determinants of Health (SDOH) Interventions    Readmission Risk Interventions No flowsheet data found.

## 2021-03-09 NOTE — Progress Notes (Signed)
PROGRESS NOTE    Tommy Green  KWI:[id number] DOB: Feb 29, 1946 DOA: 03/05/2021 PCP: [name], DO    Brief Narrative:   75 y.o. male with medical history significant of rheumatoid arthritis, CKD stage II, HTN, IDDM, diabetic neuropathy, gout, presented with episode of aphasia, and worsening tremors.  Patient underwent lumbar fusion operation 3 weeks ago and was discharged to a rehab center.  Since her discharge from hospital, patient family found patient has had episodes of tremors, and visual hallucinations.  Short episodes that were self-limiting  Pt was placed on observation for possible TIA with ARF  Assessment & Plan:   Active Problems:   AKI (acute kidney injury) ([place])   TIA (transient ischemic attack)   Gabapentin overdose, accidental or unintentional, initial encounter  Aphasia with TIA ruled out, likely gabapentin toxicity ruled in -MRI reviewed with patient. No acute process identified -Neurology had been following. Concerns made for possible gabapentin toxicity, likely made worse with dehydration recently leading to ARF below -Neuro recommendations for gradual tapering of gabapentin to eventually off -EEG performed and reviewed with Neurologist. Neg for seizures -Pt's wife reports confusion at night, but normal mentation during the day. Suspect sundowning while in hospital. When seen this AM, pt was very oriented, conversant, follows commands without any issues  Hyperkalemia -No EKG changes noted at presentation, 1 dose of Lokelma given -Potassium levels have normalized -repeat bmet in AM  Tremors -resolved -Suspected secondary to gabapentin toxicity -Per above, would cont to wean gabapentin to off  AKI on CKD stage II -Follows with urology as outpatient. -Renal US reviewed, unremarkable -Held HCTZ, ACEI, metformin. -Started amlodipine, start Januvia. -Continues with good urine output -Tolerating hydration well -Cr is continuing to improve,  now down to 1.29 -Recheck bmet in AM  IDDM with insulin resistance -hypoglycemic with glucose of 40 this AM, requiring juice and IV dextrose, since improved -Have decreased lantus to 30 units -Continue SSI  HTN -BP stable and controlled -Cont per above  RA -Fairly controlled, continue disease modification meds.  Gout -Hold colchicine -Avoiding HCTZ per above  DVT prophylaxis: heparin subq Code Status: Full Family Communication: Pt in room, family is currently at bedside  Status is: Inpatient  Continue Inpatient level of care appropriate due to severity of illness  Dispo: The patient is from: SNF              Anticipated d/c is to: SNF              Patient currently is not medically stable to d/c.   Difficult to place patient No  Consultants:   Neurology  Procedures:   MRI  EEG  Renal US  Antimicrobials: Anti-infectives (From admission, onward)   None      Subjective: Still feeling dehydrated but improved  Objective: Vitals:   03/08/21 1222 03/08/21 2050 03/09/21 0824 03/09/21 1543  BP: 127/76 120/60 136/86 (!) 153/81  Pulse: 83 89 83 83  Resp: 16 18  16   Temp: 98.6 F (37 C) 97.7 F (36.5 C)  98.9 F (37.2 C)  TempSrc:  Axillary    SpO2: 95% 97%  99%    Intake/Output Summary (Last 24 hours) at 03/09/2021 1700 Last data filed at 03/09/2021 1027 Gross per 24 hour  Intake 1381.61 ml  Output 1400 ml  Net -18.39 ml   There were no vitals filed for this visit.  Examination: General exam: Conversant, in no acute distress Respiratory system: normal chest rise, clear, no audible wheezing Cardiovascular  system: regular rhythm, s1-s2 Gastrointestinal system: Nondistended, nontender, pos BS Central nervous system: No seizures, no tremors Extremities: No cyanosis, no joint deformities Skin: No rashes, no pallor Psychiatry: Affect normal // no auditory hallucinations   Data Reviewed: I have personally reviewed following labs and imaging  studies  CBC: Recent Labs  Lab 03/05/21 0821  WBC 8.0  NEUTROABS 5.1  HGB 8.4*  HCT 27.2*  MCV 85.5  PLT 093   Basic Metabolic Panel: Recent Labs  Lab 03/05/21 0821 03/05/21 1942 03/06/21 0413 03/07/21 0251 03/08/21 0021 03/09/21 0331  NA 136 134* 135 136 135 137  K 5.8* 5.3* 4.8 4.9 3.7 3.8  CL 106 107 108 107 107 111  CO2 22 22 22  20* 21* 20*  GLUCOSE 90 129* 82 140* 120* 53*  BUN 42* 37* 34* 31* 28* 19  CREATININE 2.20* 1.97* 1.92* 1.87* 1.54* 1.29*  CALCIUM 8.9 8.8* 9.0 9.2 8.7* 8.9  MG 1.9  --   --   --   --   --    GFR: CrCl cannot be calculated (Unknown ideal weight.). Liver Function Tests: Recent Labs  Lab 03/07/21 0251 03/08/21 0021 03/09/21 0331  AST 15 13* 15  ALT 10 12 12   ALKPHOS 49 45 44  BILITOT 0.4 0.6 0.8  PROT 5.9* 5.6* 5.8*  ALBUMIN 3.0* 2.9* 2.8*   No results for input(s): LIPASE, AMYLASE in the last 168 hours. No results for input(s): AMMONIA in the last 168 hours. Coagulation Profile: Recent Labs  Lab 03/05/21 0821  INR 1.1   Cardiac Enzymes: No results for input(s): CKTOTAL, CKMB, CKMBINDEX, TROPONINI in the last 168 hours. BNP (last 3 results) No results for input(s): PROBNP in the last 8760 hours. HbA1C: No results for input(s): HGBA1C in the last 72 hours. CBG: Recent Labs  Lab 03/09/21 0801 03/09/21 0823 03/09/21 0843 03/09/21 1200 03/09/21 1543  GLUCAP 40* 58* 169* 129* 127*   Lipid Profile: No results for input(s): CHOL, HDL, LDLCALC, TRIG, CHOLHDL, LDLDIRECT in the last 72 hours. Thyroid Function Tests: No results for input(s): TSH, T4TOTAL, FREET4, T3FREE, THYROIDAB in the last 72 hours. Anemia Panel: No results for input(s): VITAMINB12, FOLATE, FERRITIN, TIBC, IRON, RETICCTPCT in the last 72 hours. Sepsis Labs: No results for input(s): PROCALCITON, LATICACIDVEN in the last 168 hours.  Recent Results (from the past 240 hour(s))  SARS CORONAVIRUS 2 (TAT 6-24 HRS) Nasopharyngeal Nasopharyngeal Swab      Status: None   Collection Time: 03/05/21  4:18 PM   Specimen: Nasopharyngeal Swab  Result Value Ref Range Status   SARS Coronavirus 2 NEGATIVE NEGATIVE Final    Comment: (NOTE) SARS-CoV-2 target nucleic acids are NOT DETECTED.  The SARS-CoV-2 RNA is generally detectable in upper and lower respiratory specimens during the acute phase of infection. Negative results do not preclude SARS-CoV-2 infection, do not rule out co-infections with other pathogens, and should not be used as the sole basis for treatment or other patient management decisions. Negative results must be combined with clinical observations, patient history, and epidemiological information. The expected result is Negative.  Fact Sheet for Patients: SugarRoll.be  Fact Sheet for Healthcare Providers: https://www.woods-mathews.com/  This test is not yet approved or cleared by the [place] FDA and  has been authorized for detection and/or diagnosis of SARS-CoV-2 by FDA under an Emergency Use Authorization (EUA). This EUA will remain  in effect (meaning this test can be used) for the duration of the COVID-19 declaration under Se ction 564(b)(1) of the Act,  21 U.S.C. section 360bbb-3(b)(1), unless the authorization is terminated or revoked sooner.  Performed at [facility], [place] [address], [place], [place] [address]   SARS CORONAVIRUS 2 (TAT 6-24 HRS) Nasopharyngeal Nasopharyngeal Swab     Status: None   Collection Time: 03/09/21 11:01 AM   Specimen: Nasopharyngeal Swab  Result Value Ref Range Status   SARS Coronavirus 2 NEGATIVE NEGATIVE Final    Comment: (NOTE) SARS-CoV-2 target nucleic acids are NOT DETECTED.  The SARS-CoV-2 RNA is generally detectable in upper and lower respiratory specimens during the acute phase of infection. Negative results do not preclude SARS-CoV-2 infection, do not rule out co-infections with other pathogens, and should not be used as  the sole basis for treatment or other patient management decisions. Negative results must be combined with clinical observations, patient history, and epidemiological information. The expected result is Negative.  Fact Sheet for Patients: SugarRoll.be  Fact Sheet for Healthcare Providers: https://www.woods-mathews.com/  This test is not yet approved or cleared by the [place] FDA and  has been authorized for detection and/or diagnosis of SARS-CoV-2 by FDA under an Emergency Use Authorization (EUA). This EUA will remain  in effect (meaning this test can be used) for the duration of the COVID-19 declaration under Se ction 564(b)(1) of the Act, 21 U.S.C. section 360bbb-3(b)(1), unless the authorization is terminated or revoked sooner.  Performed at [facility], [place] [address] [place], [place] [address]      Radiology Studies: No results found.  Scheduled Meds: . amLODipine  5 mg Oral Daily  . carvedilol  12.5 mg Oral BID WC  . fenofibrate  160 mg Oral Daily  . gabapentin  200 mg Oral QHS  . heparin  5,000 Units Subcutaneous Q12H  . insulin aspart  0-9 Units Subcutaneous TID WC  . insulin glargine  30 Units Subcutaneous QHS  . leflunomide  10 mg Oral Daily  . linagliptin  5 mg Oral Daily  . loratadine  10 mg Oral Daily  . pantoprazole  40 mg Oral Daily  . pravastatin  10 mg Oral q1800   Continuous Infusions: . sodium chloride 100 mL/hr at 03/09/21 2979     LOS: 1 day   Marylu Lund, MD [facility] Pager On Amion  If 7PM-7AM, please contact night-coverage 03/09/2021, 5:00 PM

## 2021-03-09 NOTE — Plan of Care (Signed)
  Problem: Self-Care: Goal: Ability to participate in self-care as condition permits will improve Outcome: Progressing   Problem: Self-Care: Goal: Ability to communicate needs accurately will improve Outcome: Progressing   Problem: Nutrition: Goal: Risk of aspiration will decrease Outcome: Progressing   

## 2021-03-09 NOTE — NC FL2 (Signed)
[place] MEDICAID FL2 LEVEL OF CARE SCREENING TOOL     IDENTIFICATION  Patient Name: Tommy Green Keep Birthdate: 04-03-1946 Sex: male Admission Date (Current Location): 03/05/2021  [facility] and [place] Number:  Herbalist and Address:  [facility] [place], [place] [address], [place], [place] [address]      Provider Number: [id number]  Attending Physician Name and Address:  Donne Hazel, MD  Relative Name and Phone Number:  [name] Vogt [phone]    Current Level of Care: Hospital Recommended Level of Care: [place] Prior Approval Number:    Date Approved/Denied:   PASRR Number: [id number]  Discharge Plan: SNF    Current Diagnoses: Patient Active Problem List   Diagnosis Date Noted  . Gabapentin overdose, accidental or unintentional, initial encounter 03/08/2021  . TIA (transient ischemic attack) 03/05/2021  . Body mass index (BMI) 36.0-36.9, adult 10/03/2020  . Subdural hemorrhage following injury without open intracranial wound and with prolonged loss of consciousness (more than 24 hours) without return to pre-existing conscious level ([place]) 10/03/2020  . Sore throat 12/14/2019  . AKI (acute kidney injury) ([place])   . Respiratory failure ([place])   . Acute encephalopathy   . Hypernatremia   . Dyspnea   . Acute respiratory failure with hypoxemia ([place])   . SDH (subdural hematoma) ([place]) 10/24/2019  . Chronic pain of right knee 10/03/2019  . Cyclic vomiting syndrome 08/06/2019  . Neuropathy 12/25/2018  . Bilateral hip pain 08/24/2018  . AK (actinic keratosis) 08/24/2018  . Neoplasm of uncertain behavior 24/58/0998  . 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 Tendinopathy of right gluteus medius 07/13/2018  . Tendinopathy of left gluteus medius 07/13/2018  . Squamous cell carcinoma in situ (SCCIS) of skin 03/24/2018  . Skin lesion 03/17/2018  . Vitamin D deficiency 11/29/2017  . Stage 3 chronic kidney disease ([place]) 10/28/2017   . Primary osteoarthritis of right hip 09/11/2017  . Essential hypertension 07/18/2017  . Hyperlipidemia associated with type 2 diabetes mellitus ([place]) 07/18/2017  . Type 2 diabetes mellitus with diabetic neuropathy, unspecified ([place]) 07/18/2017  . Heart murmur 07/18/2017  . Hypertriglyceridemia 07/18/2017  . History of stroke 07/18/2017  . DDD (degenerative disc disease), lumbar 06/15/2017  . Iron deficiency anemia 06/15/2017  . Stroke ([place]) 06/15/2017  . Encounter for hepatitis C screening test for low risk patient 06/30/2016  . Anemia 06/19/2016  . Microalbuminuria due to type 2 diabetes mellitus ([place]) 03/19/2016  . Hypertrophy of inferior nasal turbinate 12/20/2015  . Nasal congestion 12/15/2015  . Disease of nasal cavity and sinuses 12/10/2015  . Degenerative tear of acetabular labrum of left hip 11/04/2015  . Cellulitis and abscess of left leg 09/25/2015  . History of tobacco abuse 05/15/2015  . Morbid (severe) obesity due to excess calories ([place]) 04/11/2015  . Disorder of both eustachian tubes 01/27/2015  . Maxillary sinusitis 01/13/2015  . Lipoprotein deficiency 01/31/2014  . Gastro-esophageal reflux disease without esophagitis 11/02/2013  . Rheumatic fever 11/02/2013  . Erectile dysfunction associated with type 2 diabetes mellitus ([place]) 07/31/2013  . Pain in joint, ankle and foot 02/06/2013  . Type 2 diabetes mellitus with hyperglycemia, without long-term current use of insulin ([place]) 11/16/2012    Orientation RESPIRATION BLADDER Height & Weight     Self  Normal Continent Weight:   Height:     BEHAVIORAL SYMPTOMS/MOOD NEUROLOGICAL BOWEL NUTRITION STATUS     (n/a) Continent Diet  AMBULATORY STATUS COMMUNICATION OF NEEDS Skin   Total Care Verbally Normal  Personal Care Assistance Level of Assistance  Bathing,Feeding,Dressing Bathing Assistance: Maximum assistance Feeding assistance: Limited assistance (set up) Dressing Assistance: Maximum  assistance     Functional Limitations Info  Sight,Hearing,Speech Sight Info: Adequate Hearing Info: Adequate Speech Info: Adequate    SPECIAL CARE FACTORS FREQUENCY  PT (By licensed PT),OT (By licensed OT)     PT Frequency: 5X OT Frequency: 5X            Contractures Contractures Info: Not present    Additional Factors Info  Code Status,Allergies,Psychotropic,Insulin Sliding Scale,Isolation Precautions Code Status Info: [place] Allergies Info: Fluzone Quadrivalent (Influenza Vac Split Quad), Influenza Vaccines, Aspirin, Atorvastatin, Canagliflozin, Statins Psychotropic Info: See d/c summary for psychotropic information Insulin Sliding Scale Info: See d/c summary for sliding scale information Isolation Precautions Info: None     Current Medications (03/09/2021):  This is the current hospital active medication list Current Facility-Administered Medications  Medication Dose Route Frequency Provider Last Rate Last Admin  . 0.9 %  sodium chloride infusion   Intravenous Continuous Donne Hazel, MD 100 mL/hr at 03/09/21 0838 New Bag at 03/09/21 2706  . acetaminophen (TYLENOL) tablet 650 mg  650 mg Oral Q4H PRN Wynetta Fines T, MD   650 mg at 03/06/21 2230   Or  . acetaminophen (TYLENOL) 160 MG/5ML solution 650 mg  650 mg Per Tube Q4H PRN Wynetta Fines T, MD       Or  . acetaminophen (TYLENOL) suppository 650 mg  650 mg Rectal Q4H PRN Wynetta Fines T, MD      . amLODipine (NORVASC) tablet 5 mg  5 mg Oral Daily Wynetta Fines T, MD   5 mg at 03/09/21 [phone]  . carvedilol (COREG) tablet 12.5 mg  12.5 mg Oral BID WC Wynetta Fines T, MD   12.5 mg at 03/09/21 2831  . fenofibrate tablet 160 mg  160 mg Oral Daily Wynetta Fines T, MD   160 mg at 03/09/21 5176  . gabapentin (NEURONTIN) capsule 200 mg  200 mg Oral QHS Amie Portland, MD   200 mg at 03/08/21 2055  . heparin injection 5,000 Units  5,000 Units Subcutaneous Q12H Lequita Halt, MD   5,000 Units at 03/09/21 0840  . insulin aspart (novoLOG)  injection 0-9 Units  0-9 Units Subcutaneous TID WC Lequita Halt, MD   2 Units at 03/07/21 1730  . insulin glargine (LANTUS) injection 30 Units  30 Units Subcutaneous QHS Donne Hazel, MD      . leflunomide (ARAVA) tablet 10 mg  10 mg Oral Daily Wynetta Fines T, MD   10 mg at 03/09/21 0834  . linagliptin (TRADJENTA) tablet 5 mg  5 mg Oral Daily Wynetta Fines T, MD   5 mg at 03/09/21 0843  . lip balm (CARMEX) ointment   Topical PRN Donne Hazel, MD      . loratadine (CLARITIN) tablet 10 mg  10 mg Oral Daily Wynetta Fines T, MD   10 mg at 03/09/21 [phone]  . pantoprazole (PROTONIX) EC tablet 40 mg  40 mg Oral Daily Wynetta Fines T, MD   40 mg at 03/09/21 3710  . pravastatin (PRAVACHOL) tablet 10 mg  10 mg Oral q1800 Lequita Halt, MD   10 mg at 03/08/21 1651     Discharge Medications: Please see discharge summary for a list of discharge medications.  Relevant Imaging Results:  Relevant Lab Results:   Additional Information SS# [id number]  Moderna vaccine 08/08/20, 01/10/20, 12/11/19  Angelita Ingles, RN

## 2021-03-09 NOTE — Progress Notes (Signed)
Brief Nutrition Note   Consult received to assess patient's nutritional status/requirements. Pt initially presented with aphasia, likely 2/2 gabapentin toxicity (TIA ruled out) and likely exacerbated by dehydration leading to ARF per MD. MD notes pt's mental status is gradually improving as renal function improves. Recommending taper off Gabapentin. Tremors that were initially present have since resolved. Disposition for SNF; marked for discharge today. Discussed with RN.   Pt denies having issues with appetite. 75-100% meal completion noted. Pt on carb modified diet. Pt also denies any changes to weight, though unable to verify as no weight obtained for this admission. Recommend staff obtain weight so true weight history can be assessed given pt's mentation making him an unreliable historian.   Admitting Dx: TIA (transient ischemic attack) [G45.9] AKI (acute kidney injury) ([place]) [N17.9] Gabapentin overdose, accidental or unintentional, initial encounter [K34.9Z7H]   Past Medical History:  Diagnosis Date  . Anemia   . Essential hypertension 07/18/2017  . History of colonic polyps   . History of rheumatic fever   . History of stroke 07/18/2017  . Hypertriglyceridemia 07/18/2017  . Type 2 diabetes mellitus with diabetic neuropathy, unspecified ([place]) 07/18/2017   Wt Readings from Last 15 Encounters:  11/25/20 112.6 kg  09/15/20 114.7 kg  09/08/20 114.7 kg  09/02/20 112.9 kg  07/28/20 113.2 kg  07/15/20 114.1 kg  06/04/20 115.2 kg  06/03/20 115.8 kg  05/16/20 (!) 113.7 kg  04/22/20 116.2 kg  04/17/20 114.8 kg  04/14/20 114.8 kg  03/30/20 117 kg  03/09/20 115.7 kg  01/14/20 106.6 kg   Medications: . amLODipine  5 mg Oral Daily  . carvedilol  12.5 mg Oral BID WC  . fenofibrate  160 mg Oral Daily  . gabapentin  200 mg Oral QHS  . heparin  5,000 Units Subcutaneous Q12H  . insulin aspart  0-9 Units Subcutaneous TID WC  . insulin glargine  30 Units Subcutaneous QHS  . leflunomide  10  mg Oral Daily  . linagliptin  5 mg Oral Daily  . loratadine  10 mg Oral Daily  . pantoprazole  40 mg Oral Daily  . pravastatin  10 mg Oral q1800   Labs: Recent Labs  Lab 03/05/21 0821 03/05/21 1942 03/07/21 0251 03/08/21 0021 03/09/21 0331  NA 136   < > 136 135 137  K 5.8*   < > 4.9 3.7 3.8  CL 106   < > 107 107 111  CO2 22   < > 20* 21* 20*  BUN 42*   < > 31* 28* 19  CREATININE 2.20*   < > 1.87* 1.54* 1.29*  CALCIUM 8.9   < > 9.2 8.7* 8.9  MG 1.9  --   --   --   --   GLUCOSE 90   < > 140* 120* 53*   < > = values in this interval not displayed.    No nutrition interventions warranted at this time. If nutrition issues arise, please consult RD.    Larkin Ina, MS, RD, LDN RD pager number and weekend/on-call pager number located in [place].

## 2021-03-09 NOTE — Plan of Care (Signed)
  Problem: Safety: Goal: Ability to remain free from injury will improve Outcome: Progressing   Problem: Self-Care: Goal: Ability to communicate needs accurately will improve Outcome: Progressing

## 2021-03-09 NOTE — Progress Notes (Addendum)
CBG this am 40. Hypoglycemic protocol implemented. 8oz of orange juice with sugar provided to pt. 63min recheck CBG 58, Dextrose given via IV. MD made aware. Patient was alert to self and time.

## 2021-03-10 DIAGNOSIS — R410 Disorientation, unspecified: Secondary | ICD-10-CM | POA: Diagnosis not present

## 2021-03-10 DIAGNOSIS — Z794 Long term (current) use of insulin: Secondary | ICD-10-CM | POA: Diagnosis not present

## 2021-03-10 DIAGNOSIS — E1140 Type 2 diabetes mellitus with diabetic neuropathy, unspecified: Secondary | ICD-10-CM | POA: Diagnosis not present

## 2021-03-10 DIAGNOSIS — T426X1A Poisoning by other antiepileptic and sedative-hypnotic drugs, accidental (unintentional), initial encounter: Secondary | ICD-10-CM | POA: Diagnosis not present

## 2021-03-10 DIAGNOSIS — M48062 Spinal stenosis, lumbar region with neurogenic claudication: Secondary | ICD-10-CM | POA: Diagnosis not present

## 2021-03-10 DIAGNOSIS — F5102 Adjustment insomnia: Secondary | ICD-10-CM | POA: Diagnosis not present

## 2021-03-10 DIAGNOSIS — E1122 Type 2 diabetes mellitus with diabetic chronic kidney disease: Secondary | ICD-10-CM | POA: Diagnosis not present

## 2021-03-10 DIAGNOSIS — R5381 Other malaise: Secondary | ICD-10-CM | POA: Diagnosis not present

## 2021-03-10 DIAGNOSIS — N183 Chronic kidney disease, stage 3 unspecified: Secondary | ICD-10-CM | POA: Diagnosis not present

## 2021-03-10 DIAGNOSIS — Z4789 Encounter for other orthopedic aftercare: Secondary | ICD-10-CM | POA: Diagnosis not present

## 2021-03-10 DIAGNOSIS — F4322 Adjustment disorder with anxiety: Secondary | ICD-10-CM | POA: Diagnosis not present

## 2021-03-10 DIAGNOSIS — R531 Weakness: Secondary | ICD-10-CM | POA: Diagnosis not present

## 2021-03-10 DIAGNOSIS — I129 Hypertensive chronic kidney disease with stage 1 through stage 4 chronic kidney disease, or unspecified chronic kidney disease: Secondary | ICD-10-CM | POA: Diagnosis not present

## 2021-03-10 DIAGNOSIS — R69 Illness, unspecified: Secondary | ICD-10-CM | POA: Diagnosis not present

## 2021-03-10 DIAGNOSIS — M1A9XX0 Chronic gout, unspecified, without tophus (tophi): Secondary | ICD-10-CM | POA: Diagnosis not present

## 2021-03-10 DIAGNOSIS — Z743 Need for continuous supervision: Secondary | ICD-10-CM | POA: Diagnosis not present

## 2021-03-10 DIAGNOSIS — N179 Acute kidney failure, unspecified: Secondary | ICD-10-CM | POA: Diagnosis not present

## 2021-03-10 DIAGNOSIS — Z981 Arthrodesis status: Secondary | ICD-10-CM | POA: Diagnosis not present

## 2021-03-10 DIAGNOSIS — R52 Pain, unspecified: Secondary | ICD-10-CM | POA: Diagnosis not present

## 2021-03-10 DIAGNOSIS — N182 Chronic kidney disease, stage 2 (mild): Secondary | ICD-10-CM | POA: Diagnosis not present

## 2021-03-10 DIAGNOSIS — T426X1D Poisoning by other antiepileptic and sedative-hypnotic drugs, accidental (unintentional), subsequent encounter: Secondary | ICD-10-CM | POA: Diagnosis not present

## 2021-03-10 DIAGNOSIS — E1165 Type 2 diabetes mellitus with hyperglycemia: Secondary | ICD-10-CM | POA: Diagnosis not present

## 2021-03-10 DIAGNOSIS — I350 Nonrheumatic aortic (valve) stenosis: Secondary | ICD-10-CM | POA: Diagnosis not present

## 2021-03-10 DIAGNOSIS — M069 Rheumatoid arthritis, unspecified: Secondary | ICD-10-CM | POA: Diagnosis not present

## 2021-03-10 DIAGNOSIS — G459 Transient cerebral ischemic attack, unspecified: Secondary | ICD-10-CM | POA: Diagnosis not present

## 2021-03-10 LAB — GLUCOSE, CAPILLARY
Glucose-Capillary: 138 mg/dL — ABNORMAL HIGH (ref 70–99)
Glucose-Capillary: 161 mg/dL — ABNORMAL HIGH (ref 70–99)

## 2021-03-10 MED ORDER — GABAPENTIN 100 MG PO CAPS: 200.0000 mg | ORAL_CAPSULE | Freq: Every day | ORAL | 0 refills | Status: DC

## 2021-03-10 MED ORDER — LINAGLIPTIN 5 MG PO TABS: 5.0000 mg | ORAL_TABLET | Freq: Every day | ORAL | Status: DC

## 2021-03-10 MED ORDER — AMLODIPINE BESYLATE 5 MG PO TABS: 5.0000 mg | ORAL_TABLET | Freq: Every day | ORAL | 0 refills | Status: DC

## 2021-03-10 MED ORDER — QUETIAPINE FUMARATE 25 MG PO TABS: 25.0000 mg | ORAL_TABLET | Freq: Every day | ORAL | 0 refills | Status: DC

## 2021-03-10 MED ORDER — INSULIN DEGLUDEC 100 UNIT/ML ~~LOC~~ SOPN: 30.0000 [IU] | PEN_INJECTOR | Freq: Every day | SUBCUTANEOUS | Status: DC

## 2021-03-10 NOTE — TOC Transition Note (Signed)
Transition of Care [facility]) - CM/SW Discharge Note   Patient Details  Name: Tommy Green MRN: [id number] Date of Birth: May 20, 1946  [facility]) CM/SW Contact:  Angelita Ingles, RN Phone Number: [phone]  03/10/2021, 2:10 PM   Clinical Narrative:    Patient to be discharged back to [facility]. Wife at bedside and has been made aware. Transport has been set up via [place]. No other needs noted at this time. TOC will sign off.  Please call report to [facility] Room # [phone]     Final next level of care: Skilled Nursing Facility Barriers to Discharge: No Barriers Identified   Patient Goals and CMS Choice Patient states their goals for this hospitalization and ongoing recovery are::  (Patient rambles does not answer question directly)      Discharge Placement              Patient [facility] bed at: [facility] Patient to be transferred to facility by: [place] Name of family member notified: [name] Denn @ bedside Patient and family notified of of transfer: 03/10/21 (wife at bedside and made aware by nurse)  Discharge Plan and Services In-house Referral: NA Discharge Planning Services: CM Consult            DME Arranged: N/A DME Agency: NA       HH Arranged: NA HH Agency: NA        Social Determinants of Health ([place]) Interventions     Readmission Risk Interventions No flowsheet data found.

## 2021-03-10 NOTE — Discharge Summary (Signed)
Physician Discharge Summary  Nolawi Kanady Graser XTK:[id number] DOB: Apr 15, 1946 DOA: 03/05/2021  PCP: [name], DO  Admit date: 03/05/2021 Discharge date: 03/10/2021  Admitted From: SNF Disposition:  SNF  Recommendations for Outpatient Follow-up:  1. Follow up with PCP in 1-2 weeks 2. Recommend to gradually wean gabapentin over time to goal of off over the next several weeks  Discharge Condition:Improved CODE STATUS:Full Diet recommendation: Diabetic   Brief/Interim Summary: 75 y.o.malewith medical history significant ofrheumatoid arthritis, CKD stage II,HTN, IDDM, diabetic neuropathy, gout, presented with episode of aphasia, and worsening tremors.  Patient underwent lumbar fusion operation 3 weeks ago and was discharged to a rehab center. Since her discharge from hospital, patient family found patient has had episodes of tremors, and visual hallucinations. Short episodes that were self-limiting  Discharge Diagnoses:  Active Problems:   AKI (acute kidney injury) ([place])   TIA (transient ischemic attack)   Gabapentin overdose, accidental or unintentional, initial encounter  Aphasia with TIA ruled out, likely gabapentin toxicity ruled in -MRI reviewed with patient. No acute process identified -Neurology had been following. Concerns made for possible gabapentin toxicity, likely made worse with dehydration recently leading to ARF below -Neuro recommendations for gradual tapering of gabapentin to eventually off -EEG performed and reviewed with Neurologist. Neg for seizures -Pt's wife reports confusion at night, but normal mentation during the day. Suspect sundowning while in hospital. Mentation seems much improved during the day. Have given trial of seroquel at night  Hyperkalemia -No EKG changes noted at presentation, 1 dose of Lokelma given -Potassium levels have normalized  Tremors -resolved -Suspected secondary to gabapentin toxicity -Per above, would cont to  wean gabapentin to off  AKI on CKD stage II -Follows with urology as outpatient. -Renal US reviewed, unremarkable -stopped HCTZ, ACEI, metformin. -Started amlodipine, start tradjenta -Continues with good urine output -Tolerating hydration well -Cr is continuing to improve, now down to 1.29 on most recent check  IDDMwith insulin resistance -hypoglycemic with glucose of 40 this AM, requiring juice and IV dextrose, since improved -Have decreased lantus to 30 units -Continue SSI  HTN -BP stable and controlled -Cont per above  RA -Fairly controlled, continue disease modification meds.  Gout -Held colchicine -Avoiding HCTZ per above  Discharge Instructions   Allergies as of 03/10/2021      Reactions   Fluzone Quadrivalent [influenza Vac Split Quad] Other (See Comments)   "Got the flu"   Influenza Vaccines Other (See Comments)   "Got the flu"   Aspirin Other (See Comments)   Gastroesophageal reflux and "allergic," per [facility]   Atorvastatin Other (See Comments)   Myalgias, GI upset, and "allergic," per [facility]   Canagliflozin Other (See Comments)   Pt reports that the use of Invokana caused acute KIDNEY FAILURE ([facility] records) and "allergic," per [facility]   Statins Other (See Comments)   Muscle pain- "allergic," per [facility]      Medication List    STOP taking these medications   alclomethasone 0.05 % cream Commonly known as: ACLOVATE   colchicine 0.6 MG tablet   Enbrel Mini 50 MG/ML Soct Generic drug: Etanercept   gabapentin 600 MG tablet Commonly known as: NEURONTIN Replaced by: gabapentin 100 MG capsule   hydrochlorothiazide 25 MG tablet Commonly known as: HYDRODIURIL   HYDROcodone-acetaminophen 5-325 MG tablet Commonly known as: NORCO/VICODIN   insulin aspart 100 UNIT/ML injection Commonly known as: novoLOG   lisinopril 40 MG tablet Commonly known as: ZESTRIL   metFORMIN 1000 MG (MOD) 24 hr tablet Commonly known  as: GLUMETZA   metFORMIN 500 MG  tablet Commonly known as: GLUCOPHAGE     TAKE these medications   acetaminophen 325 MG tablet Commonly known as: TYLENOL Take 650 mg by mouth every 6 (six) hours as needed (for pain).   amLODipine 5 MG tablet Commonly known as: NORVASC Take 1 tablet (5 mg total) by mouth daily. Start taking on: Mar 11, 2021   B COMPLEX PO Take 1 tablet by mouth daily. What changed: Another medication with the same name was removed. Continue taking this medication, and follow the directions you see here.   carvedilol 12.5 MG tablet Commonly known as: COREG Take 1 tablet (12.5 mg total) by mouth 2 (two) times daily with a meal.   cetirizine 10 MG tablet Commonly known as: ZYRTEC Take 1 tablet (10 mg total) by mouth daily.   fenofibrate micronized 200 MG capsule Commonly known as: LOFIBRA TAKE 1 CAPSULE DAILY BEFOREBREAKFAST What changed: See the new instructions.   gabapentin 100 MG capsule Commonly known as: NEURONTIN Take 2 capsules (200 mg total) by mouth at bedtime. Replaces: gabapentin 600 MG tablet   insulin degludec 100 UNIT/ML FlexTouch Pen Commonly known as: TRESIBA Inject 30 Units into the skin at bedtime. What changed: how much to take   insulin lispro 100 UNIT/ML injection Commonly known as: HUMALOG Inject 0-10 Units into the skin See admin instructions. Inject 0-10 units into the skin before meals, PER SLIDING SCALE: BGL 0-200 = give nothing, 201-250 = 2 units, 251-300 = 4 units, 301-350 = 6 units, 351-400 = 8 units, >400 = 10 units, push fluids, and repeat BGL check in 2 hours. If BGL remains >400 after 2 hours, CALL MD.   leflunomide 10 MG tablet Commonly known as: ARAVA TAKE 1 TABLET BY MOUTH EVERY DAY   linagliptin 5 MG Tabs tablet Commonly known as: TRADJENTA Take 1 tablet (5 mg total) by mouth daily. Start taking on: Mar 11, 2021   lovastatin 10 MG tablet Commonly known as: MEVACOR Take 1 tablet (10 mg total) by mouth at bedtime.   Magnesium 200 MG Tabs Take  200 mg by mouth daily in the afternoon.   multivitamin with minerals tablet Take 1 tablet by mouth daily.   omeprazole 20 MG capsule Commonly known as: PRILOSEC Take 20 mg by mouth daily before breakfast.   OneTouch Verio test strip Generic drug: glucose blood 4 (four) times daily.   Potassium 99 MG Tabs Take 99 mg by mouth daily.   QUEtiapine 25 MG tablet Commonly known as: SEROQUEL Take 1 tablet (25 mg total) by mouth at bedtime.   Turmeric 500 MG Caps Take by mouth daily.   vitamin B-12 1000 MCG tablet Commonly known as: CYANOCOBALAMIN Take 1,000 mcg by mouth daily.   Vitamin D3 Super Strength 50 MCG (2000 UT) Caps Generic drug: Cholecalciferol Take 2,000 Units by mouth daily.       Contact information for after-discharge care    Destination    [facility] .   Service: Skilled Nursing Contact information: [address] [place] [address] [phone]                 Allergies  Allergen Reactions  . Fluzone Quadrivalent [Influenza Vac Split Quad] Other (See Comments)    "Got the flu"  . Influenza Vaccines Other (See Comments)    "Got the flu"  . Aspirin Other (See Comments)    Gastroesophageal reflux and "allergic," per [facility]   .  Atorvastatin Other (See Comments)    Myalgias, GI upset, and "allergic," per [facility]  . Canagliflozin Other (See Comments)    Pt reports that the use of Invokana caused acute KIDNEY FAILURE ([facility] records) and "allergic," per [facility]  . Statins Other (See Comments)    Muscle pain- "allergic," per [facility]    Consultations:  Neurology  Procedures/Studies: CT Head Wo Contrast  Result Date: 03/05/2021 CLINICAL DATA:  Hand tremors and slurred speech EXAM: CT HEAD WITHOUT CONTRAST TECHNIQUE: Contiguous axial images were obtained from the base of the skull through the vertex without intravenous contrast. COMPARISON:  September 10, 2020 FINDINGS: Brain: There is mild diffuse atrophy with  asymmetric atrophy in the right frontal and parietal regions compared to the left side, a finding stable from prior study. There is no intracranial mass, hemorrhage, extra-axial fluid collection, or midline shift. There remains a linear dural area of increased opacity at the level of the craniotomy, likely due to previous subdural hematoma with repair and potential postoperative thickening. This appearance is stable compared to the prior study. There is not felt to be recurrence of subdural hematoma in this area based on the appearance of the prior study. There is evidence of a prior infarct in the left frontal lobe anterior and lateral to the anterior horn of the left lateral ventricle. Patchy periventricular small vessel disease also noted. No acute appearing infarct evident. Vascular: No appreciable hyperdense vessel. Calcification noted in the carotid siphon regions. Skull: Evidence of previous left frontal and parietal craniotomy, unchanged. No new bone lesions. Sinuses/Orbits: Mucosal thickening in several ethmoid air cells noted. There is also mild mucosal thickening in the inferior left maxillary antrum. Orbits appear symmetric bilaterally. Other: Mastoid air cells clear. IMPRESSION: Asymmetric atrophy right frontal and parietal lobes compared to the left side, stable. Dural thickening on the left is stable compared to prior study, likely due to scarring/repair of dura at time of craniotomy on the left. No acute appearing extra-axial fluid collection evident. No mass or hemorrhage. There is evidence of a prior infarct in the left frontal lobe as well as patchy periventricular small vessel disease. No acute infarct evident. There are foci of arterial vascular calcification. Mucosal thickening noted in several paranasal sinus regions. Electronically Signed   By: Lowella Grip III M.D.   On: 03/05/2021 09:23   MR BRAIN WO CONTRAST  Result Date: 03/06/2021 CLINICAL DATA:  Delirium.  Hand tremors and  slurred speech. EXAM: MRI HEAD WITHOUT CONTRAST TECHNIQUE: Multiplanar, multiecho pulse sequences of the brain and surrounding structures were obtained without intravenous contrast. COMPARISON:  Head CT 03/05/2021 FINDINGS: Brain: There is no evidence of an acute infarct, mass, or midline shift. Sequelae of left-sided subdural hematoma evacuation are again identified with suspected mild dural thickening over the cerebral convexity but no significant residual subdural fluid collection. There are a few chronic microhemorrhages in the right cerebral hemisphere, and there is also mild superficial siderosis bilaterally consistent with remote subarachnoid hemorrhage. Minimal hemosiderin deposition is also noted in the occipital horn of the right lateral ventricle. A small chronic infarct anteriorly in the left frontal lobe is unchanged. T2 hyperintensities elsewhere in the cerebral white matter bilaterally and in the pons are nonspecific but compatible with mild chronic small vessel ischemic disease. There is mild cerebral atrophy. There may be a small incidental arachnoid cyst over the posterior right frontal lobe near the vertex. Vascular: Major intracranial vascular flow voids are preserved. Skull and upper cervical spine: Left-sided craniotomy. No suspicious marrow  lesion. Sinuses/Orbits: Unremarkable orbits. Paranasal sinuses and mastoid air cells are clear. Other: None. IMPRESSION: 1. No acute intracranial abnormality. 2. Mild chronic small vessel ischemic disease. 3. Chronic left frontal infarct. Electronically Signed   By: Logan Bores M.D.   On: 03/06/2021 09:16   US RENAL  Result Date: 03/05/2021 CLINICAL DATA:  Acute kidney injury. EXAM: RENAL / URINARY TRACT ULTRASOUND COMPLETE COMPARISON:  None. FINDINGS: Right Kidney: Renal measurements: 11.0 cm x 6.7 cm x 5.3 cm = volume: 202.4 mL. Echogenicity within normal limits. No mass or hydronephrosis visualized. Left Kidney: Renal measurements: 12.7 cm x 7.0 cm  x 7.4 cm = volume: 341.6 mL. Echogenicity within normal limits. No mass or hydronephrosis visualized. Bladder: Appears normal for degree of bladder distention. Other: None. IMPRESSION: Normal renal ultrasound. Electronically Signed   By: Virgina Norfolk M.D.   On: 03/05/2021 16:21   EEG adult  Result Date: 03/06/2021 Lora Havens, MD     03/06/2021  4:33 PM Patient Name: Tommy Green MRN: [id number] Epilepsy Attending: Lora Havens Referring Physician/Provider: Dr Amie [place] Date: 03/06/2021 Duration: 24.10 mins Patient history: 75yo M with L frontal stroe, SDH s/p left crani who presented with slurred speech tremors. EEG to evaluate for seizure Level of alertness: Awake AEDs during EEG study: GBP Technical aspects: This EEG study was done with scalp electrodes positioned according to the 10-20 International system of electrode placement. Electrical activity was acquired at a sampling rate of 500Hz  and reviewed with a high frequency filter of 70Hz  and a low frequency filter of 1Hz . EEG data were recorded continuously and digitally stored. Description: The posterior dominant rhythm consists of 7.5 Hz activity of moderate voltage (25-35 uV) seen predominantly in posterior head regions, symmetric and reactive to eye opening and eye closing. EEG showed intermittent sharply contoured 3 to 6 Hz theta-delta slowing with overriding 15-18hz  beta activity in left fronto-centro-temporal region consistent with prior craniotomy. Continuous low amplitude 3-5 Hz theta-delta slowing was also noted in right frontemporal region. Hyperventilation and photic stimulation were not performed.   ABNORMALITY -Breach artifact, left fronto-centro-temporal region - Continuous slow, right frontemporal region. IMPRESSION: This study is suggestive of cortical dysfunction arising from right frontotemporal region likely secondary to underlying structural abnormality. Additionally, there is cortical dysfunction in left  fronto-centro-temporal region consistent with prior craniotomy. No seizures or epileptiform discharges were seen throughout the recording. Priyanka Barbra Sarks     Subjective: Eager to return to SNF  Discharge Exam: Vitals:   03/10/21 0811 03/10/21 1225  BP:  117/62  Pulse: 83 79  Resp: 20   Temp: 98.3 F (36.8 C) 98.3 F (36.8 C)  SpO2: 98% 100%   Vitals:   03/09/21 1949 03/10/21 0515 03/10/21 0811 03/10/21 1225  BP: 131/70 135/70  117/62  Pulse: 85 76 83 79  Resp: 18 18 20    Temp: 98.9 F (37.2 C) 98.4 F (36.9 C) 98.3 F (36.8 C) 98.3 F (36.8 C)  TempSrc: Oral Oral Oral Oral  SpO2: 100% 96% 98% 100%    General: Pt is alert, awake, not in acute distress Cardiovascular: RRR, S1/S2 + Respiratory: CTA bilaterally, no wheezing, no rhonchi Abdominal: Soft, NT, ND, bowel sounds + Extremities: no edema, no cyanosis   The results of significant diagnostics from this hospitalization (including imaging, microbiology, ancillary and laboratory) are listed below for reference.     Microbiology: Recent Results (from the past 240 hour(s))  SARS CORONAVIRUS 2 (TAT 6-24 HRS) Nasopharyngeal Nasopharyngeal Swab  Status: None   Collection Time: 03/05/21  4:18 PM   Specimen: Nasopharyngeal Swab  Result Value Ref Range Status   SARS Coronavirus 2 NEGATIVE NEGATIVE Final    Comment: (NOTE) SARS-CoV-2 target nucleic acids are NOT DETECTED.  The SARS-CoV-2 RNA is generally detectable in upper and lower respiratory specimens during the acute phase of infection. Negative results do not preclude SARS-CoV-2 infection, do not rule out co-infections with other pathogens, and should not be used as the sole basis for treatment or other patient management decisions. Negative results must be combined with clinical observations, patient history, and epidemiological information. The expected result is Negative.  Fact Sheet for Patients: SugarRoll.be  Fact  Sheet for Healthcare Providers: https://www.woods-mathews.com/  This test is not yet approved or cleared by the [place] FDA and  has been authorized for detection and/or diagnosis of SARS-CoV-2 by FDA under an Emergency Use Authorization (EUA). This EUA will remain  in effect (meaning this test can be used) for the duration of the COVID-19 declaration under Se ction 564(b)(1) of the Act, 21 U.S.C. section 360bbb-3(b)(1), unless the authorization is terminated or revoked sooner.  Performed at [facility], [place] [address], [place], [place] [address]   SARS CORONAVIRUS 2 (TAT 6-24 HRS) Nasopharyngeal Nasopharyngeal Swab     Status: None   Collection Time: 03/09/21 11:01 AM   Specimen: Nasopharyngeal Swab  Result Value Ref Range Status   SARS Coronavirus 2 NEGATIVE NEGATIVE Final    Comment: (NOTE) SARS-CoV-2 target nucleic acids are NOT DETECTED.  The SARS-CoV-2 RNA is generally detectable in upper and lower respiratory specimens during the acute phase of infection. Negative results do not preclude SARS-CoV-2 infection, do not rule out co-infections with other pathogens, and should not be used as the sole basis for treatment or other patient management decisions. Negative results must be combined with clinical observations, patient history, and epidemiological information. The expected result is Negative.  Fact Sheet for Patients: SugarRoll.be  Fact Sheet for Healthcare Providers: https://www.woods-mathews.com/  This test is not yet approved or cleared by the [place] FDA and  has been authorized for detection and/or diagnosis of SARS-CoV-2 by FDA under an Emergency Use Authorization (EUA). This EUA will remain  in effect (meaning this test can be used) for the duration of the COVID-19 declaration under Se ction 564(b)(1) of the Act, 21 U.S.C. section 360bbb-3(b)(1), unless the authorization is terminated  or revoked sooner.  Performed at [facility], [place] [address] [place], [place] [address]      Labs: BNP (last 3 results) No results for input(s): BNP in the last 8760 hours. Basic Metabolic Panel: Recent Labs  Lab 03/05/21 0821 03/05/21 1942 03/06/21 0413 03/07/21 0251 03/08/21 0021 03/09/21 0331  NA 136 134* 135 136 135 137  K 5.8* 5.3* 4.8 4.9 3.7 3.8  CL 106 107 108 107 107 111  CO2 22 22 22  20* 21* 20*  GLUCOSE 90 129* 82 140* 120* 53*  BUN 42* 37* 34* 31* 28* 19  CREATININE 2.20* 1.97* 1.92* 1.87* 1.54* 1.29*  CALCIUM 8.9 8.8* 9.0 9.2 8.7* 8.9  MG 1.9  --   --   --   --   --    Liver Function Tests: Recent Labs  Lab 03/07/21 0251 03/08/21 0021 03/09/21 0331  AST 15 13* 15  ALT 10 12 12   ALKPHOS 49 45 44  BILITOT 0.4 0.6 0.8  PROT 5.9* 5.6* 5.8*  ALBUMIN 3.0* 2.9* 2.8*  No results for input(s): LIPASE, AMYLASE in the last 168 hours. No results for input(s): AMMONIA in the last 168 hours. CBC: Recent Labs  Lab 03/05/21 0821  WBC 8.0  NEUTROABS 5.1  HGB 8.4*  HCT 27.2*  MCV 85.5  PLT 311   Cardiac Enzymes: No results for input(s): CKTOTAL, CKMB, CKMBINDEX, TROPONINI in the last 168 hours. BNP: Invalid input(s): POCBNP CBG: Recent Labs  Lab 03/09/21 1200 03/09/21 1543 03/09/21 1955 03/10/21 0807 03/10/21 1222  GLUCAP 129* 127* 162* 138* 161*   D-Dimer No results for input(s): DDIMER in the last 72 hours. Hgb A1c No results for input(s): HGBA1C in the last 72 hours. Lipid Profile No results for input(s): CHOL, HDL, LDLCALC, TRIG, CHOLHDL, LDLDIRECT in the last 72 hours. Thyroid function studies No results for input(s): TSH, T4TOTAL, T3FREE, THYROIDAB in the last 72 hours.  Invalid input(s): FREET3 Anemia work up No results for input(s): VITAMINB12, FOLATE, FERRITIN, TIBC, IRON, RETICCTPCT in the last 72 hours. Urinalysis    Component Value Date/Time   COLORURINE STRAW (A) 03/05/2021 1133   APPEARANCEUR CLEAR 03/05/2021 1133    LABSPEC 1.008 03/05/2021 1133   PHURINE 7.0 03/05/2021 1133   GLUCOSEU NEGATIVE 03/05/2021 1133   [place] 03/05/2021 [place] 03/05/2021 [place] 03/05/2021 [place] 03/05/2021 1133   NITRITE NEGATIVE 03/05/2021 1133   [place] 03/05/2021 1133   Sepsis Labs Invalid input(s): PROCALCITONIN,  WBC,  LACTICIDVEN Microbiology Recent Results (from the past 240 hour(s))  SARS CORONAVIRUS 2 (TAT 6-24 HRS) Nasopharyngeal Nasopharyngeal Swab     Status: None   Collection Time: 03/05/21  4:18 PM   Specimen: Nasopharyngeal Swab  Result Value Ref Range Status   SARS Coronavirus 2 NEGATIVE NEGATIVE Final    Comment: (NOTE) SARS-CoV-2 target nucleic acids are NOT DETECTED.  The SARS-CoV-2 RNA is generally detectable in upper and lower respiratory specimens during the acute phase of infection. Negative results do not preclude SARS-CoV-2 infection, do not rule out co-infections with other pathogens, and should not be used as the sole basis for treatment or other patient management decisions. Negative results must be combined with clinical observations, patient history, and epidemiological information. The expected result is Negative.  Fact Sheet for Patients: SugarRoll.be  Fact Sheet for Healthcare Providers: https://www.woods-mathews.com/  This test is not yet approved or cleared by the [place] FDA and  has been authorized for detection and/or diagnosis of SARS-CoV-2 by FDA under an Emergency Use Authorization (EUA). This EUA will remain  in effect (meaning this test can be used) for the duration of the COVID-19 declaration under Se ction 564(b)(1) of the Act, 21 U.S.C. section 360bbb-3(b)(1), unless the authorization is terminated or revoked sooner.  Performed at [facility], [place] [address] [place], [place] [address]   SARS CORONAVIRUS 2 (TAT 6-24 HRS)  Nasopharyngeal Nasopharyngeal Swab     Status: None   Collection Time: 03/09/21 11:01 AM   Specimen: Nasopharyngeal Swab  Result Value Ref Range Status   SARS Coronavirus 2 NEGATIVE NEGATIVE Final    Comment: (NOTE) SARS-CoV-2 target nucleic acids are NOT DETECTED.  The SARS-CoV-2 RNA is generally detectable in upper and lower respiratory specimens during the acute phase of infection. Negative results do not preclude SARS-CoV-2 infection, do not rule out co-infections with other pathogens, and should not be used as the sole basis for treatment or other patient management decisions. Negative results must be combined with clinical observations, patient  history, and epidemiological information. The expected result is Negative.  Fact Sheet for Patients: SugarRoll.be  Fact Sheet for Healthcare Providers: https://www.woods-mathews.com/  This test is not yet approved or cleared by the [place] FDA and  has been authorized for detection and/or diagnosis of SARS-CoV-2 by FDA under an Emergency Use Authorization (EUA). This EUA will remain  in effect (meaning this test can be used) for the duration of the COVID-19 declaration under Se ction 564(b)(1) of the Act, 21 U.S.C. section 360bbb-3(b)(1), unless the authorization is terminated or revoked sooner.  Performed at [facility], [place] [address] [place], [place] [address]    Time spent: 30 min  SIGNED:   Marylu Lund, MD  [facility] 03/10/2021, 1:36 PM  If 7PM-7AM, please contact night-coverage

## 2021-03-10 NOTE — Plan of Care (Signed)
  Problem: Education: Goal: Knowledge of General Education information will improve Description: Including pain rating scale, medication(s)/side effects and non-pharmacologic comfort measures Outcome: Adequate for Discharge   Problem: Health Behavior/Discharge Planning: Goal: Ability to manage health-related needs will improve Outcome: Adequate for Discharge   Problem: Clinical Measurements: Goal: Ability to maintain clinical measurements within normal limits will improve Outcome: Adequate for Discharge Goal: Will remain free from infection Outcome: Adequate for Discharge Goal: Diagnostic test results will improve Outcome: Adequate for Discharge Goal: Respiratory complications will improve Outcome: Adequate for Discharge Goal: Cardiovascular complication will be avoided Outcome: Adequate for Discharge   Problem: Activity: Goal: Risk for activity intolerance will decrease Outcome: Adequate for Discharge   Problem: Nutrition: Goal: Adequate nutrition will be maintained Outcome: Adequate for Discharge   Problem: Coping: Goal: Level of anxiety will decrease Outcome: Adequate for Discharge   Problem: Elimination: Goal: Will not experience complications related to bowel motility Outcome: Adequate for Discharge Goal: Will not experience complications related to urinary retention Outcome: Adequate for Discharge   Problem: Pain Managment: Goal: General experience of comfort will improve Outcome: Adequate for Discharge   Problem: Safety: Goal: Ability to remain free from injury will improve Outcome: Adequate for Discharge   Problem: Skin Integrity: Goal: Risk for impaired skin integrity will decrease Outcome: Adequate for Discharge   Problem: Education: Goal: Knowledge of disease or condition will improve Outcome: Adequate for Discharge   Problem: Coping: Goal: Will verbalize positive feelings about self Outcome: Adequate for Discharge Goal: Will identify appropriate  support needs Outcome: Adequate for Discharge   Problem: Health Behavior/Discharge Planning: Goal: Ability to manage health-related needs will improve Outcome: Adequate for Discharge   Problem: Self-Care: Goal: Ability to participate in self-care as condition permits will improve Outcome: Adequate for Discharge Goal: Ability to communicate needs accurately will improve Outcome: Adequate for Discharge   Problem: Nutrition: Goal: Risk of aspiration will decrease Outcome: Adequate for Discharge   Problem: Intracerebral Hemorrhage Tissue Perfusion: Goal: Complications of Intracerebral Hemorrhage will be minimized Outcome: Adequate for Discharge   Problem: Ischemic Stroke/TIA Tissue Perfusion: Goal: Complications of ischemic stroke/TIA will be minimized Outcome: Adequate for Discharge   Problem: Spontaneous Subarachnoid Hemorrhage Tissue Perfusion: Goal: Complications of Spontaneous Subarachnoid Hemorrhage will be minimized Outcome: Adequate for Discharge

## 2021-03-10 NOTE — Progress Notes (Signed)
Physical Therapy Treatment Patient Details Name: Tommy Green MRN: [id number] DOB: Mar 02, 1946 Today's Date: 03/10/2021    History of Present Illness Pt is a 75 y.o. male admitted from SNF on 03/05/21 with BUE tremors, speech difficulty. Head CT negative for acute infact. Concerns made for possible gabapentin toxicity, likely made worse with dehydration recently leading to ARF. PMH includes recent lumbar sx (02/02/21) with d/c to SNF, SDH s/p crani, RA, CKD 2, HTN, DM, neuropathy, gout.   PT Comments    Pt progressing well with mobility. Today's session focused on transfer and gait training, but requiring use of RW and frequent modA for mobility. Pt limited by generalized weakness, decreased activity tolerance, impaired balance strategies/postural reactions; at high risk for falls. Pt still with confusion, but pleasant, agreeable and motivated to participate. Continue to recommend SNF-level therapies to maximize functional mobility and independence prior to return home.   Follow Up Recommendations  SNF;Supervision for mobility/OOB     Equipment Recommendations   (defer)    Recommendations for Other Services       Precautions / Restrictions Precautions Precautions: Back;Fall Precaution Comments: verbally reviewed back precautions Required Braces or Orthoses: Spinal Brace;Other Brace Spinal Brace: Lumbar corset;Applied in sitting position Other Brace: L AFO Restrictions Weight Bearing Restrictions: No    Mobility  Bed Mobility Overal bed mobility: Needs Assistance Bed Mobility: Rolling;Sidelying to Sit Rolling: Supervision Sidelying to sit: Mod assist       General bed mobility comments: Cues for log roll, good ability to roll onto R-side with use of bed rail; minA for LLE management, heavy modA for trunk elevation from sidelying to sitting    Transfers Overall transfer level: Needs assistance Equipment used: Rolling walker (2 wheeled) Transfers: Sit to/from Stand Sit  to Stand: Min assist;Mod assist;From elevated surface         General transfer comment: Initial minA to stand from elevated bed height, pt reliant on momentum to power into standing, minA for trunk elevation; additional 2x trials from recliner, heavy reliance on UE support, ModA for trunk elevation and stability; pt with unsafe techniques requiring frequent cues for sequencing/safety  Ambulation/Gait Ambulation/Gait assistance: Mod assist Gait Distance (Feet): 2 Feet Assistive device: Rolling walker (2 wheeled) Gait Pattern/deviations: Step-to pattern;Staggering left;Staggering right;Trunk flexed;Decreased dorsiflexion - left;Leaning posteriorly Gait velocity: Decreased   General Gait Details: Slow, unsteady gait from bed to recliner with RW and modA for stability; pt with difficulty progressing LLE without L AFO donned; poor safety awareness requiring frequent cues for safety/sequencing   Stairs             Wheelchair Mobility    Modified Rankin (Stroke Patients Only)       Balance Overall balance assessment: Needs assistance Sitting-balance support: Feet supported;No upper extremity supported Sitting balance-Leahy Scale: Fair     Standing balance support: Bilateral upper extremity supported;During functional activity Standing balance-Leahy Scale: Poor Standing balance comment: Reliant on BUE support                            Cognition Arousal/Alertness: Awake/alert Behavior During Therapy: WFL for tasks assessed/performed Overall Cognitive Status: Impaired/Different from baseline Area of Impairment: Orientation;Attention;Memory;Following commands;Safety/judgement;Awareness;Problem solving                 Orientation Level: Disoriented to;Situation Current Attention Level: Selective Memory: Decreased short-term memory;Decreased recall of precautions Following Commands: Follows one step commands consistently;Follows multi-step commands  inconsistently Safety/Judgement: Decreased awareness of safety;Decreased awareness  of deficits Awareness: Intellectual;Emergent Problem Solving: Requires verbal cues;Requires tactile cues        Exercises      General Comments General comments (skin integrity, edema, etc.): Requires assist to place brace around trunk, able to adjust velcro/straps himself      Pertinent Vitals/Pain Pain Assessment: Faces Faces Pain Scale: Hurts a little bit Pain Location: back, LLE Pain Descriptors / Indicators: Discomfort Pain Intervention(s): Monitored during session;Repositioned    Home Living                      Prior Function            PT Goals (current goals can now be found in the care plan section) Progress towards PT goals: Progressing toward goals    Frequency    Min 2X/week      PT Plan Current plan remains appropriate    Co-evaluation              AM-PAC PT "6 Clicks" Mobility   Outcome Measure  Help needed turning from your back to your side while in a flat bed without using bedrails?: A Little Help needed moving from lying on your back to sitting on the side of a flat bed without using bedrails?: A Lot Help needed moving to and from a bed to a chair (including a wheelchair)?: A Lot Help needed standing up from a chair using your arms (e.g., wheelchair or bedside chair)?: A Lot Help needed to walk in hospital room?: A Lot Help needed climbing 3-5 steps with a railing? : A Lot 6 Click Score: 13    End of Session Equipment Utilized During Treatment: Gait belt;Back brace Activity Tolerance: Patient tolerated treatment well Patient left: in chair;with call bell/phone within reach;with chair alarm set Nurse Communication: Mobility status PT Visit Diagnosis: Unsteadiness on feet (R26.81);Muscle weakness (generalized) (M62.81);History of falling (Z91.81)     Time: 3419-6222 PT Time Calculation (min) (ACUTE ONLY): 20 min  Charges:  $Therapeutic  Activity: 8-22 mins                     Mabeline Caras, PT, DPT Acute Rehabilitation Services  Pager [phone] Office [place] 03/10/2021, 11:22 AM

## 2021-03-10 NOTE — Progress Notes (Signed)
Report called and given to [facility] at [place].

## 2021-03-11 DIAGNOSIS — Z981 Arthrodesis status: Secondary | ICD-10-CM | POA: Diagnosis not present

## 2021-03-11 DIAGNOSIS — N179 Acute kidney failure, unspecified: Secondary | ICD-10-CM | POA: Diagnosis not present

## 2021-03-11 DIAGNOSIS — G459 Transient cerebral ischemic attack, unspecified: Secondary | ICD-10-CM | POA: Diagnosis not present

## 2021-03-11 DIAGNOSIS — E1122 Type 2 diabetes mellitus with diabetic chronic kidney disease: Secondary | ICD-10-CM | POA: Diagnosis not present

## 2021-03-11 DIAGNOSIS — M069 Rheumatoid arthritis, unspecified: Secondary | ICD-10-CM | POA: Diagnosis not present

## 2021-03-11 DIAGNOSIS — I129 Hypertensive chronic kidney disease with stage 1 through stage 4 chronic kidney disease, or unspecified chronic kidney disease: Secondary | ICD-10-CM | POA: Diagnosis not present

## 2021-03-11 DIAGNOSIS — N183 Chronic kidney disease, stage 3 unspecified: Secondary | ICD-10-CM | POA: Diagnosis not present

## 2021-03-11 DIAGNOSIS — T426X1D Poisoning by other antiepileptic and sedative-hypnotic drugs, accidental (unintentional), subsequent encounter: Secondary | ICD-10-CM | POA: Diagnosis not present

## 2021-03-11 DIAGNOSIS — M1A9XX0 Chronic gout, unspecified, without tophus (tophi): Secondary | ICD-10-CM | POA: Diagnosis not present

## 2021-03-11 DIAGNOSIS — R69 Illness, unspecified: Secondary | ICD-10-CM | POA: Diagnosis not present

## 2021-03-17 DIAGNOSIS — N182 Chronic kidney disease, stage 2 (mild): Secondary | ICD-10-CM | POA: Diagnosis not present

## 2021-03-17 DIAGNOSIS — M069 Rheumatoid arthritis, unspecified: Secondary | ICD-10-CM | POA: Diagnosis not present

## 2021-03-17 DIAGNOSIS — E1122 Type 2 diabetes mellitus with diabetic chronic kidney disease: Secondary | ICD-10-CM | POA: Diagnosis not present

## 2021-03-17 DIAGNOSIS — Z794 Long term (current) use of insulin: Secondary | ICD-10-CM | POA: Diagnosis not present

## 2021-03-17 DIAGNOSIS — Z981 Arthrodesis status: Secondary | ICD-10-CM | POA: Diagnosis not present

## 2021-03-17 DIAGNOSIS — I129 Hypertensive chronic kidney disease with stage 1 through stage 4 chronic kidney disease, or unspecified chronic kidney disease: Secondary | ICD-10-CM | POA: Diagnosis not present

## 2021-03-17 DIAGNOSIS — N179 Acute kidney failure, unspecified: Secondary | ICD-10-CM | POA: Diagnosis not present

## 2021-03-27 DIAGNOSIS — E1140 Type 2 diabetes mellitus with diabetic neuropathy, unspecified: Secondary | ICD-10-CM | POA: Diagnosis not present

## 2021-03-27 DIAGNOSIS — M069 Rheumatoid arthritis, unspecified: Secondary | ICD-10-CM | POA: Diagnosis not present

## 2021-03-27 DIAGNOSIS — I350 Nonrheumatic aortic (valve) stenosis: Secondary | ICD-10-CM | POA: Diagnosis not present

## 2021-03-28 DIAGNOSIS — M25552 Pain in left hip: Secondary | ICD-10-CM | POA: Diagnosis not present

## 2021-03-28 DIAGNOSIS — Z981 Arthrodesis status: Secondary | ICD-10-CM | POA: Diagnosis not present

## 2021-03-28 DIAGNOSIS — S7002XA Contusion of left hip, initial encounter: Secondary | ICD-10-CM | POA: Diagnosis not present

## 2021-03-28 DIAGNOSIS — I10 Essential (primary) hypertension: Secondary | ICD-10-CM | POA: Diagnosis not present

## 2021-03-28 DIAGNOSIS — M160 Bilateral primary osteoarthritis of hip: Secondary | ICD-10-CM | POA: Diagnosis not present

## 2021-03-28 DIAGNOSIS — Y92019 Unspecified place in single-family (private) house as the place of occurrence of the external cause: Secondary | ICD-10-CM | POA: Diagnosis not present

## 2021-03-28 DIAGNOSIS — W19XXXA Unspecified fall, initial encounter: Secondary | ICD-10-CM | POA: Diagnosis not present

## 2021-03-28 DIAGNOSIS — M79652 Pain in left thigh: Secondary | ICD-10-CM | POA: Diagnosis not present

## 2021-03-28 DIAGNOSIS — M1612 Unilateral primary osteoarthritis, left hip: Secondary | ICD-10-CM | POA: Diagnosis not present

## 2021-03-28 DIAGNOSIS — M79605 Pain in left leg: Secondary | ICD-10-CM | POA: Diagnosis not present

## 2021-03-28 DIAGNOSIS — Z043 Encounter for examination and observation following other accident: Secondary | ICD-10-CM | POA: Diagnosis not present

## 2021-03-28 DIAGNOSIS — Z743 Need for continuous supervision: Secondary | ICD-10-CM | POA: Diagnosis not present

## 2021-03-28 DIAGNOSIS — S0990XA Unspecified injury of head, initial encounter: Secondary | ICD-10-CM | POA: Diagnosis not present

## 2021-03-30 ENCOUNTER — Telehealth: Payer: Self-pay | Admitting: Family Medicine

## 2021-03-30 DIAGNOSIS — G8929 Other chronic pain: Secondary | ICD-10-CM | POA: Diagnosis not present

## 2021-03-30 DIAGNOSIS — T8484XD Pain due to internal orthopedic prosthetic devices, implants and grafts, subsequent encounter: Secondary | ICD-10-CM | POA: Diagnosis not present

## 2021-03-30 DIAGNOSIS — I350 Nonrheumatic aortic (valve) stenosis: Secondary | ICD-10-CM | POA: Diagnosis not present

## 2021-03-30 DIAGNOSIS — E1122 Type 2 diabetes mellitus with diabetic chronic kidney disease: Secondary | ICD-10-CM | POA: Diagnosis not present

## 2021-03-30 DIAGNOSIS — M069 Rheumatoid arthritis, unspecified: Secondary | ICD-10-CM | POA: Diagnosis not present

## 2021-03-30 DIAGNOSIS — I959 Hypotension, unspecified: Secondary | ICD-10-CM | POA: Diagnosis not present

## 2021-03-30 DIAGNOSIS — N183 Chronic kidney disease, stage 3 unspecified: Secondary | ICD-10-CM | POA: Diagnosis not present

## 2021-03-30 DIAGNOSIS — E1165 Type 2 diabetes mellitus with hyperglycemia: Secondary | ICD-10-CM | POA: Diagnosis not present

## 2021-03-30 DIAGNOSIS — E1142 Type 2 diabetes mellitus with diabetic polyneuropathy: Secondary | ICD-10-CM | POA: Diagnosis not present

## 2021-03-30 DIAGNOSIS — Z4789 Encounter for other orthopedic aftercare: Secondary | ICD-10-CM | POA: Diagnosis not present

## 2021-03-30 DIAGNOSIS — I129 Hypertensive chronic kidney disease with stage 1 through stage 4 chronic kidney disease, or unspecified chronic kidney disease: Secondary | ICD-10-CM | POA: Diagnosis not present

## 2021-03-30 DIAGNOSIS — M5136 Other intervertebral disc degeneration, lumbar region: Secondary | ICD-10-CM | POA: Diagnosis not present

## 2021-03-30 NOTE — Telephone Encounter (Signed)
Called informed [place] ok for orders They will be faxed over/PCP signature

## 2021-03-30 NOTE — Telephone Encounter (Signed)
Caller: Trish ([place] ) PT  Call back # [phone]  Patient was in the hospital on 02/02/21, discharge from [place] rehab on 06/11   She wants to know if Dr. Nani Ravens would sign PT home health orders

## 2021-03-31 ENCOUNTER — Telehealth: Payer: Self-pay | Admitting: Family Medicine

## 2021-03-31 DIAGNOSIS — I350 Nonrheumatic aortic (valve) stenosis: Secondary | ICD-10-CM | POA: Diagnosis not present

## 2021-03-31 DIAGNOSIS — G8929 Other chronic pain: Secondary | ICD-10-CM | POA: Diagnosis not present

## 2021-03-31 DIAGNOSIS — E1122 Type 2 diabetes mellitus with diabetic chronic kidney disease: Secondary | ICD-10-CM | POA: Diagnosis not present

## 2021-03-31 DIAGNOSIS — I959 Hypotension, unspecified: Secondary | ICD-10-CM | POA: Diagnosis not present

## 2021-03-31 DIAGNOSIS — N183 Chronic kidney disease, stage 3 unspecified: Secondary | ICD-10-CM | POA: Diagnosis not present

## 2021-03-31 DIAGNOSIS — T8484XD Pain due to internal orthopedic prosthetic devices, implants and grafts, subsequent encounter: Secondary | ICD-10-CM | POA: Diagnosis not present

## 2021-03-31 DIAGNOSIS — E1142 Type 2 diabetes mellitus with diabetic polyneuropathy: Secondary | ICD-10-CM | POA: Diagnosis not present

## 2021-03-31 DIAGNOSIS — E1165 Type 2 diabetes mellitus with hyperglycemia: Secondary | ICD-10-CM | POA: Diagnosis not present

## 2021-03-31 DIAGNOSIS — M069 Rheumatoid arthritis, unspecified: Secondary | ICD-10-CM | POA: Diagnosis not present

## 2021-03-31 DIAGNOSIS — I129 Hypertensive chronic kidney disease with stage 1 through stage 4 chronic kidney disease, or unspecified chronic kidney disease: Secondary | ICD-10-CM | POA: Diagnosis not present

## 2021-03-31 DIAGNOSIS — Z981 Arthrodesis status: Secondary | ICD-10-CM

## 2021-03-31 NOTE — Telephone Encounter (Signed)
Tommy Green form [facility] Caller# [place] OT twice a week for 3 week once a week for 2 week Pt, need a prescription for a wheel chair 18 with 18 depth fax order to [facility]  @ [phone]

## 2021-03-31 NOTE — Telephone Encounter (Signed)
Called [facility] informed of PCP ok for orders. Will fax over order to [place] for Wheel chair.

## 2021-04-01 ENCOUNTER — Telehealth: Payer: Self-pay | Admitting: Rheumatology

## 2021-04-01 MED ORDER — ENBREL MINI 50 MG/ML ~~LOC~~ SOCT: 50.0000 mg | SUBCUTANEOUS | 0 refills | Status: DC

## 2021-04-01 NOTE — Telephone Encounter (Signed)
Patient's wife request refill on patients Enbrel prefilled, sent to [facility]. Wife could not remember if she needed to call here for refill or pharmacy.

## 2021-04-01 NOTE — Telephone Encounter (Addendum)
Next Visit: 05/05/2021  Last Visit: 09/08/2020  Last Fill: 11/28/2020  NG:BMBOMQTTCN arthritis involving multiple sites with positive rheumatoid factor  Current Dose per office note 08/19/2020: Enbrel mini 50 mg sq injections once weekly  Labs: 02/08/2021 CBC: WBC 13.2, RBC 3.73, Hgb 10.0, Hct 29.8, MCV 79.9, MCH 26.8, Neutrophil Absolute 9.1, Monocyte Absolute 0.9, CMP 03/09/2021 CO2 20, Glucose 53, Creat. 1.29 GFR 57, Total Protein 5.8, Albumin 2.8  TB Gold: 04/17/2020 Neg    Patient currently in wheelchair. Patient just returned home from rehab.   Okay to refill Enbrel?

## 2021-04-02 DIAGNOSIS — T8484XD Pain due to internal orthopedic prosthetic devices, implants and grafts, subsequent encounter: Secondary | ICD-10-CM | POA: Diagnosis not present

## 2021-04-02 DIAGNOSIS — I959 Hypotension, unspecified: Secondary | ICD-10-CM | POA: Diagnosis not present

## 2021-04-02 DIAGNOSIS — M069 Rheumatoid arthritis, unspecified: Secondary | ICD-10-CM | POA: Diagnosis not present

## 2021-04-02 DIAGNOSIS — E1122 Type 2 diabetes mellitus with diabetic chronic kidney disease: Secondary | ICD-10-CM | POA: Diagnosis not present

## 2021-04-02 DIAGNOSIS — I129 Hypertensive chronic kidney disease with stage 1 through stage 4 chronic kidney disease, or unspecified chronic kidney disease: Secondary | ICD-10-CM | POA: Diagnosis not present

## 2021-04-02 DIAGNOSIS — E1165 Type 2 diabetes mellitus with hyperglycemia: Secondary | ICD-10-CM | POA: Diagnosis not present

## 2021-04-02 DIAGNOSIS — N183 Chronic kidney disease, stage 3 unspecified: Secondary | ICD-10-CM | POA: Diagnosis not present

## 2021-04-02 DIAGNOSIS — I350 Nonrheumatic aortic (valve) stenosis: Secondary | ICD-10-CM | POA: Diagnosis not present

## 2021-04-02 DIAGNOSIS — E1142 Type 2 diabetes mellitus with diabetic polyneuropathy: Secondary | ICD-10-CM | POA: Diagnosis not present

## 2021-04-02 DIAGNOSIS — G8929 Other chronic pain: Secondary | ICD-10-CM | POA: Diagnosis not present

## 2021-04-06 ENCOUNTER — Telehealth: Payer: Self-pay

## 2021-04-06 DIAGNOSIS — M21372 Foot drop, left foot: Secondary | ICD-10-CM | POA: Diagnosis not present

## 2021-04-06 DIAGNOSIS — M4325 Fusion of spine, thoracolumbar region: Secondary | ICD-10-CM | POA: Diagnosis not present

## 2021-04-06 DIAGNOSIS — M4326 Fusion of spine, lumbar region: Secondary | ICD-10-CM | POA: Diagnosis not present

## 2021-04-06 NOTE — Telephone Encounter (Signed)
Pt's Spouse, [name], called stating she has a list of medications pt is on from being in the hospital, then at [facility] and from his PCP and she is concerned and wants to be sure pt is taking the correct medications.  [name] stated pt had a major back surgery then had a fall and was then placed at [facility] for recovery time and is back home now but has a ways to go.  I advised they may need a follow up visit for any medication refills PCP will have to take over from hospital [place] would like a return call from [place].  She stated morning calls are best for her.

## 2021-04-06 NOTE — Telephone Encounter (Signed)
Called the patient's wife and did clarify his medication list.

## 2021-04-07 DIAGNOSIS — N183 Chronic kidney disease, stage 3 unspecified: Secondary | ICD-10-CM | POA: Diagnosis not present

## 2021-04-07 DIAGNOSIS — G8929 Other chronic pain: Secondary | ICD-10-CM | POA: Diagnosis not present

## 2021-04-07 DIAGNOSIS — E1122 Type 2 diabetes mellitus with diabetic chronic kidney disease: Secondary | ICD-10-CM | POA: Diagnosis not present

## 2021-04-07 DIAGNOSIS — E1165 Type 2 diabetes mellitus with hyperglycemia: Secondary | ICD-10-CM | POA: Diagnosis not present

## 2021-04-07 DIAGNOSIS — I129 Hypertensive chronic kidney disease with stage 1 through stage 4 chronic kidney disease, or unspecified chronic kidney disease: Secondary | ICD-10-CM | POA: Diagnosis not present

## 2021-04-07 DIAGNOSIS — I350 Nonrheumatic aortic (valve) stenosis: Secondary | ICD-10-CM | POA: Diagnosis not present

## 2021-04-07 DIAGNOSIS — M069 Rheumatoid arthritis, unspecified: Secondary | ICD-10-CM | POA: Diagnosis not present

## 2021-04-07 DIAGNOSIS — I959 Hypotension, unspecified: Secondary | ICD-10-CM | POA: Diagnosis not present

## 2021-04-07 DIAGNOSIS — E1142 Type 2 diabetes mellitus with diabetic polyneuropathy: Secondary | ICD-10-CM | POA: Diagnosis not present

## 2021-04-07 DIAGNOSIS — M4320 Fusion of spine, site unspecified: Secondary | ICD-10-CM | POA: Diagnosis not present

## 2021-04-07 DIAGNOSIS — T8484XD Pain due to internal orthopedic prosthetic devices, implants and grafts, subsequent encounter: Secondary | ICD-10-CM | POA: Diagnosis not present

## 2021-04-08 ENCOUNTER — Telehealth: Payer: Self-pay | Admitting: Family Medicine

## 2021-04-08 DIAGNOSIS — E1165 Type 2 diabetes mellitus with hyperglycemia: Secondary | ICD-10-CM | POA: Diagnosis not present

## 2021-04-08 DIAGNOSIS — G8929 Other chronic pain: Secondary | ICD-10-CM | POA: Diagnosis not present

## 2021-04-08 DIAGNOSIS — E1142 Type 2 diabetes mellitus with diabetic polyneuropathy: Secondary | ICD-10-CM | POA: Diagnosis not present

## 2021-04-08 DIAGNOSIS — E1122 Type 2 diabetes mellitus with diabetic chronic kidney disease: Secondary | ICD-10-CM | POA: Diagnosis not present

## 2021-04-08 DIAGNOSIS — M069 Rheumatoid arthritis, unspecified: Secondary | ICD-10-CM | POA: Diagnosis not present

## 2021-04-08 DIAGNOSIS — N183 Chronic kidney disease, stage 3 unspecified: Secondary | ICD-10-CM | POA: Diagnosis not present

## 2021-04-08 DIAGNOSIS — T8484XD Pain due to internal orthopedic prosthetic devices, implants and grafts, subsequent encounter: Secondary | ICD-10-CM | POA: Diagnosis not present

## 2021-04-08 DIAGNOSIS — I129 Hypertensive chronic kidney disease with stage 1 through stage 4 chronic kidney disease, or unspecified chronic kidney disease: Secondary | ICD-10-CM | POA: Diagnosis not present

## 2021-04-08 DIAGNOSIS — I959 Hypotension, unspecified: Secondary | ICD-10-CM | POA: Diagnosis not present

## 2021-04-08 DIAGNOSIS — I350 Nonrheumatic aortic (valve) stenosis: Secondary | ICD-10-CM | POA: Diagnosis not present

## 2021-04-08 MED ORDER — GABAPENTIN 100 MG PO CAPS: 200.0000 mg | ORAL_CAPSULE | Freq: Every day | ORAL | 0 refills | Status: DC

## 2021-04-08 MED ORDER — AMLODIPINE BESYLATE 5 MG PO TABS: 5.0000 mg | ORAL_TABLET | Freq: Every day | ORAL | 0 refills | Status: DC

## 2021-04-08 MED ORDER — LINAGLIPTIN 5 MG PO TABS: 5.0000 mg | ORAL_TABLET | Freq: Every day | ORAL | 0 refills | Status: DC

## 2021-04-08 NOTE — Telephone Encounter (Signed)
Refills done.

## 2021-04-08 NOTE — Telephone Encounter (Signed)
Medication:  linagliptin (TRADJENTA) 5 MG TABS tablet [[id number]]    gabapentin (NEURONTIN) 100 MG capsule [[id number]]   amLODipine (NORVASC) 5 MG tablet [[id number]]  Has the patient contacted their pharmacy? No. (If no, request that the patient contact the pharmacy for the refill.) (If yes, when and what did the pharmacy advise?)     Preferred Pharmacy (with phone number or street name):    [facility], [place] to Registered [place] Sites Phone:  [phone]  Fax:  [phone]      Agent: Please be advised that RX refills may take up to 3 business days. We ask that you follow-up with your pharmacy.

## 2021-04-09 DIAGNOSIS — N183 Chronic kidney disease, stage 3 unspecified: Secondary | ICD-10-CM | POA: Diagnosis not present

## 2021-04-09 DIAGNOSIS — I350 Nonrheumatic aortic (valve) stenosis: Secondary | ICD-10-CM | POA: Diagnosis not present

## 2021-04-09 DIAGNOSIS — I959 Hypotension, unspecified: Secondary | ICD-10-CM | POA: Diagnosis not present

## 2021-04-09 DIAGNOSIS — E1142 Type 2 diabetes mellitus with diabetic polyneuropathy: Secondary | ICD-10-CM | POA: Diagnosis not present

## 2021-04-09 DIAGNOSIS — E1122 Type 2 diabetes mellitus with diabetic chronic kidney disease: Secondary | ICD-10-CM | POA: Diagnosis not present

## 2021-04-09 DIAGNOSIS — T8484XD Pain due to internal orthopedic prosthetic devices, implants and grafts, subsequent encounter: Secondary | ICD-10-CM | POA: Diagnosis not present

## 2021-04-09 DIAGNOSIS — E1165 Type 2 diabetes mellitus with hyperglycemia: Secondary | ICD-10-CM | POA: Diagnosis not present

## 2021-04-09 DIAGNOSIS — M069 Rheumatoid arthritis, unspecified: Secondary | ICD-10-CM | POA: Diagnosis not present

## 2021-04-09 DIAGNOSIS — G8929 Other chronic pain: Secondary | ICD-10-CM | POA: Diagnosis not present

## 2021-04-09 DIAGNOSIS — I129 Hypertensive chronic kidney disease with stage 1 through stage 4 chronic kidney disease, or unspecified chronic kidney disease: Secondary | ICD-10-CM | POA: Diagnosis not present

## 2021-04-10 DIAGNOSIS — M069 Rheumatoid arthritis, unspecified: Secondary | ICD-10-CM | POA: Diagnosis not present

## 2021-04-10 DIAGNOSIS — E1122 Type 2 diabetes mellitus with diabetic chronic kidney disease: Secondary | ICD-10-CM | POA: Diagnosis not present

## 2021-04-10 DIAGNOSIS — N183 Chronic kidney disease, stage 3 unspecified: Secondary | ICD-10-CM | POA: Diagnosis not present

## 2021-04-10 DIAGNOSIS — I350 Nonrheumatic aortic (valve) stenosis: Secondary | ICD-10-CM | POA: Diagnosis not present

## 2021-04-10 DIAGNOSIS — I129 Hypertensive chronic kidney disease with stage 1 through stage 4 chronic kidney disease, or unspecified chronic kidney disease: Secondary | ICD-10-CM | POA: Diagnosis not present

## 2021-04-10 DIAGNOSIS — T8484XD Pain due to internal orthopedic prosthetic devices, implants and grafts, subsequent encounter: Secondary | ICD-10-CM | POA: Diagnosis not present

## 2021-04-10 DIAGNOSIS — I959 Hypotension, unspecified: Secondary | ICD-10-CM | POA: Diagnosis not present

## 2021-04-10 DIAGNOSIS — E1165 Type 2 diabetes mellitus with hyperglycemia: Secondary | ICD-10-CM | POA: Diagnosis not present

## 2021-04-10 DIAGNOSIS — G8929 Other chronic pain: Secondary | ICD-10-CM | POA: Diagnosis not present

## 2021-04-10 DIAGNOSIS — E1142 Type 2 diabetes mellitus with diabetic polyneuropathy: Secondary | ICD-10-CM | POA: Diagnosis not present

## 2021-04-14 DIAGNOSIS — E1142 Type 2 diabetes mellitus with diabetic polyneuropathy: Secondary | ICD-10-CM | POA: Diagnosis not present

## 2021-04-14 DIAGNOSIS — E1165 Type 2 diabetes mellitus with hyperglycemia: Secondary | ICD-10-CM | POA: Diagnosis not present

## 2021-04-14 DIAGNOSIS — I350 Nonrheumatic aortic (valve) stenosis: Secondary | ICD-10-CM | POA: Diagnosis not present

## 2021-04-14 DIAGNOSIS — G8929 Other chronic pain: Secondary | ICD-10-CM | POA: Diagnosis not present

## 2021-04-14 DIAGNOSIS — M069 Rheumatoid arthritis, unspecified: Secondary | ICD-10-CM | POA: Diagnosis not present

## 2021-04-14 DIAGNOSIS — N183 Chronic kidney disease, stage 3 unspecified: Secondary | ICD-10-CM | POA: Diagnosis not present

## 2021-04-14 DIAGNOSIS — I129 Hypertensive chronic kidney disease with stage 1 through stage 4 chronic kidney disease, or unspecified chronic kidney disease: Secondary | ICD-10-CM | POA: Diagnosis not present

## 2021-04-14 DIAGNOSIS — I959 Hypotension, unspecified: Secondary | ICD-10-CM | POA: Diagnosis not present

## 2021-04-14 DIAGNOSIS — T8484XD Pain due to internal orthopedic prosthetic devices, implants and grafts, subsequent encounter: Secondary | ICD-10-CM | POA: Diagnosis not present

## 2021-04-14 DIAGNOSIS — E1122 Type 2 diabetes mellitus with diabetic chronic kidney disease: Secondary | ICD-10-CM | POA: Diagnosis not present

## 2021-04-15 DIAGNOSIS — I350 Nonrheumatic aortic (valve) stenosis: Secondary | ICD-10-CM | POA: Diagnosis not present

## 2021-04-15 DIAGNOSIS — E1122 Type 2 diabetes mellitus with diabetic chronic kidney disease: Secondary | ICD-10-CM | POA: Diagnosis not present

## 2021-04-15 DIAGNOSIS — I129 Hypertensive chronic kidney disease with stage 1 through stage 4 chronic kidney disease, or unspecified chronic kidney disease: Secondary | ICD-10-CM | POA: Diagnosis not present

## 2021-04-15 DIAGNOSIS — E1165 Type 2 diabetes mellitus with hyperglycemia: Secondary | ICD-10-CM | POA: Diagnosis not present

## 2021-04-15 DIAGNOSIS — I959 Hypotension, unspecified: Secondary | ICD-10-CM | POA: Diagnosis not present

## 2021-04-15 DIAGNOSIS — M069 Rheumatoid arthritis, unspecified: Secondary | ICD-10-CM | POA: Diagnosis not present

## 2021-04-15 DIAGNOSIS — E1142 Type 2 diabetes mellitus with diabetic polyneuropathy: Secondary | ICD-10-CM | POA: Diagnosis not present

## 2021-04-15 DIAGNOSIS — N183 Chronic kidney disease, stage 3 unspecified: Secondary | ICD-10-CM | POA: Diagnosis not present

## 2021-04-15 DIAGNOSIS — G8929 Other chronic pain: Secondary | ICD-10-CM | POA: Diagnosis not present

## 2021-04-15 DIAGNOSIS — T8484XD Pain due to internal orthopedic prosthetic devices, implants and grafts, subsequent encounter: Secondary | ICD-10-CM | POA: Diagnosis not present

## 2021-04-16 DIAGNOSIS — I959 Hypotension, unspecified: Secondary | ICD-10-CM | POA: Diagnosis not present

## 2021-04-16 DIAGNOSIS — G8929 Other chronic pain: Secondary | ICD-10-CM | POA: Diagnosis not present

## 2021-04-16 DIAGNOSIS — E1122 Type 2 diabetes mellitus with diabetic chronic kidney disease: Secondary | ICD-10-CM | POA: Diagnosis not present

## 2021-04-16 DIAGNOSIS — E1165 Type 2 diabetes mellitus with hyperglycemia: Secondary | ICD-10-CM | POA: Diagnosis not present

## 2021-04-16 DIAGNOSIS — I350 Nonrheumatic aortic (valve) stenosis: Secondary | ICD-10-CM | POA: Diagnosis not present

## 2021-04-16 DIAGNOSIS — I129 Hypertensive chronic kidney disease with stage 1 through stage 4 chronic kidney disease, or unspecified chronic kidney disease: Secondary | ICD-10-CM | POA: Diagnosis not present

## 2021-04-16 DIAGNOSIS — N183 Chronic kidney disease, stage 3 unspecified: Secondary | ICD-10-CM | POA: Diagnosis not present

## 2021-04-16 DIAGNOSIS — E1142 Type 2 diabetes mellitus with diabetic polyneuropathy: Secondary | ICD-10-CM | POA: Diagnosis not present

## 2021-04-16 DIAGNOSIS — M069 Rheumatoid arthritis, unspecified: Secondary | ICD-10-CM | POA: Diagnosis not present

## 2021-04-16 DIAGNOSIS — T8484XD Pain due to internal orthopedic prosthetic devices, implants and grafts, subsequent encounter: Secondary | ICD-10-CM | POA: Diagnosis not present

## 2021-04-17 DIAGNOSIS — I129 Hypertensive chronic kidney disease with stage 1 through stage 4 chronic kidney disease, or unspecified chronic kidney disease: Secondary | ICD-10-CM | POA: Diagnosis not present

## 2021-04-17 DIAGNOSIS — N183 Chronic kidney disease, stage 3 unspecified: Secondary | ICD-10-CM | POA: Diagnosis not present

## 2021-04-17 DIAGNOSIS — E1165 Type 2 diabetes mellitus with hyperglycemia: Secondary | ICD-10-CM | POA: Diagnosis not present

## 2021-04-17 DIAGNOSIS — I959 Hypotension, unspecified: Secondary | ICD-10-CM | POA: Diagnosis not present

## 2021-04-17 DIAGNOSIS — G8929 Other chronic pain: Secondary | ICD-10-CM | POA: Diagnosis not present

## 2021-04-17 DIAGNOSIS — T8484XD Pain due to internal orthopedic prosthetic devices, implants and grafts, subsequent encounter: Secondary | ICD-10-CM | POA: Diagnosis not present

## 2021-04-17 DIAGNOSIS — I350 Nonrheumatic aortic (valve) stenosis: Secondary | ICD-10-CM | POA: Diagnosis not present

## 2021-04-17 DIAGNOSIS — M069 Rheumatoid arthritis, unspecified: Secondary | ICD-10-CM | POA: Diagnosis not present

## 2021-04-17 DIAGNOSIS — E1122 Type 2 diabetes mellitus with diabetic chronic kidney disease: Secondary | ICD-10-CM | POA: Diagnosis not present

## 2021-04-17 DIAGNOSIS — E1142 Type 2 diabetes mellitus with diabetic polyneuropathy: Secondary | ICD-10-CM | POA: Diagnosis not present

## 2021-04-21 ENCOUNTER — Telehealth: Payer: Self-pay | Admitting: Family Medicine

## 2021-04-21 DIAGNOSIS — G8929 Other chronic pain: Secondary | ICD-10-CM | POA: Diagnosis not present

## 2021-04-21 DIAGNOSIS — I129 Hypertensive chronic kidney disease with stage 1 through stage 4 chronic kidney disease, or unspecified chronic kidney disease: Secondary | ICD-10-CM | POA: Diagnosis not present

## 2021-04-21 DIAGNOSIS — E1122 Type 2 diabetes mellitus with diabetic chronic kidney disease: Secondary | ICD-10-CM | POA: Diagnosis not present

## 2021-04-21 DIAGNOSIS — N183 Chronic kidney disease, stage 3 unspecified: Secondary | ICD-10-CM | POA: Diagnosis not present

## 2021-04-21 DIAGNOSIS — E1142 Type 2 diabetes mellitus with diabetic polyneuropathy: Secondary | ICD-10-CM | POA: Diagnosis not present

## 2021-04-21 DIAGNOSIS — E1165 Type 2 diabetes mellitus with hyperglycemia: Secondary | ICD-10-CM | POA: Diagnosis not present

## 2021-04-21 DIAGNOSIS — I959 Hypotension, unspecified: Secondary | ICD-10-CM | POA: Diagnosis not present

## 2021-04-21 DIAGNOSIS — T8484XD Pain due to internal orthopedic prosthetic devices, implants and grafts, subsequent encounter: Secondary | ICD-10-CM | POA: Diagnosis not present

## 2021-04-21 DIAGNOSIS — M069 Rheumatoid arthritis, unspecified: Secondary | ICD-10-CM | POA: Diagnosis not present

## 2021-04-21 DIAGNOSIS — I350 Nonrheumatic aortic (valve) stenosis: Secondary | ICD-10-CM | POA: Diagnosis not present

## 2021-04-21 NOTE — Progress Notes (Deleted)
Office Visit Note  Patient: Tommy Green             Date of Birth: 1946-04-25           MRN: [id number]             PCP: [name], DO Referring: [name]* Visit Date: 05/05/2021 Occupation: @GUAROCC @  Subjective:  No chief complaint on file.   History of Present Illness: Eual Lindstrom Fury is a 75 y.o. male ***   Activities of Daily Living:  Patient reports morning stiffness for *** {minute/hour:19697}.   Patient {ACTIONS;DENIES/REPORTS:21021675::"Denies"} nocturnal pain.  Difficulty dressing/grooming: {ACTIONS;DENIES/REPORTS:21021675::"Denies"} Difficulty climbing stairs: {ACTIONS;DENIES/REPORTS:21021675::"Denies"} Difficulty getting out of chair: {ACTIONS;DENIES/REPORTS:21021675::"Denies"} Difficulty using hands for taps, buttons, cutlery, and/or writing: {ACTIONS;DENIES/REPORTS:21021675::"Denies"}  No Rheumatology ROS completed.   PMFS History:  Patient Active Problem List   Diagnosis Date Noted   Gabapentin overdose, accidental or unintentional, initial encounter 03/08/2021   TIA (transient ischemic attack) 03/05/2021   Body mass index (BMI) 36.0-36.9, adult 10/03/2020   Subdural hemorrhage following injury without open intracranial wound and with prolonged loss of consciousness (more than 24 hours) without return to pre-existing conscious level ([place]) 10/03/2020   Sore throat 12/14/2019   AKI (acute kidney injury) ([place])    Respiratory failure (HCC)    Acute encephalopathy    Hypernatremia    Dyspnea    Acute respiratory failure with hypoxemia (HCC)    SDH (subdural hematoma) ([place]) 10/24/2019   Chronic pain of right knee 67/09/4579   Cyclic vomiting syndrome 08/06/2019   Neuropathy 12/25/2018   Bilateral hip pain 08/24/2018   AK (actinic keratosis) 08/24/2018   Neoplasm of uncertain behavior 99/83/3825   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   Tendinopathy of right gluteus medius 07/13/2018   Tendinopathy of left gluteus medius  07/13/2018   Squamous cell carcinoma in situ (SCCIS) of skin 03/24/2018   Skin lesion 03/17/2018   Vitamin D deficiency 11/29/2017   Stage 3 chronic kidney disease ([place]) 10/28/2017   Primary osteoarthritis of right hip 09/11/2017   Essential hypertension 07/18/2017   Hyperlipidemia associated with type 2 diabetes mellitus ([place]) 07/18/2017   Type 2 diabetes mellitus with diabetic neuropathy, unspecified ([place]) 07/18/2017   Heart murmur 07/18/2017   Hypertriglyceridemia 07/18/2017   History of stroke 07/18/2017   DDD (degenerative disc disease), lumbar 06/15/2017   Iron deficiency anemia 06/15/2017   Stroke ([place]) 06/15/2017   Encounter for hepatitis C screening test for low risk patient 06/30/2016   Anemia 06/19/2016   Microalbuminuria due to type 2 diabetes mellitus ([place]) 03/19/2016   Hypertrophy of inferior nasal turbinate 12/20/2015   Nasal congestion 12/15/2015   Disease of nasal cavity and sinuses 12/10/2015   Degenerative tear of acetabular labrum of left hip 11/04/2015   Cellulitis and abscess of left leg 09/25/2015   History of tobacco abuse 05/15/2015   Morbid (severe) obesity due to excess calories ([place]) 04/11/2015   Disorder of both eustachian tubes 01/27/2015   Maxillary sinusitis 01/13/2015   Lipoprotein deficiency 01/31/2014   Gastro-esophageal reflux disease without esophagitis 11/02/2013   Rheumatic fever 11/02/2013   Erectile dysfunction associated with type 2 diabetes mellitus ([place]) 07/31/2013   Pain in joint, ankle and foot 02/06/2013   Type 2 diabetes mellitus with hyperglycemia, without long-term current use of insulin ([place]) 11/16/2012    Past Medical History:  Diagnosis Date   Anemia    Essential hypertension 07/18/2017   History of colonic polyps    History of rheumatic fever  History of stroke 07/18/2017   Hypertriglyceridemia 07/18/2017   Type 2 diabetes mellitus with diabetic neuropathy, unspecified ([place]) 07/18/2017    Family History  Problem Relation  Age of Onset   Hyperlipidemia Mother    Colon polyps Mother    Hyperlipidemia Father    Alcoholism Brother    Healthy Son    Healthy Son    Cancer Neg Hx    Colon cancer Neg Hx    Esophageal cancer Neg Hx    Rectal cancer Neg Hx    Stomach cancer Neg Hx    Past Surgical History:  Procedure Laterality Date   COLONOSCOPY     around 2018 [facility]   COLONOSCOPY  09/02/2020   CRANIOTOMY Left 10/25/2019   Procedure: CRANIOTOMY HEMATOMA EVACUATION SUBDURAL;  Surgeon: Vallarie Mare, MD;  Location: [facility];  Service: Neurosurgery;  Laterality: Left;   ESOPHAGOGASTRODUODENOSCOPY     around 2018 with [facility]   FOOT CAPSULOTOMY Left 07/25/2008   Mid Foot #2 MPJ   Hammertoe Repair Left 07/25/2008   #2 toe   SPINAL FUSION     TARSAL TUNNEL RELEASE Left 07/25/2008   UPPER GASTROINTESTINAL ENDOSCOPY  09/02/2020   Social History   Social History Narrative   Not on file   Immunization History  Administered Date(s) Administered   Influenza-Unspecified 07/31/2013, 04/17/2014   Moderna Sars-Covid-2 Vaccination 12/11/2019, 01/10/2020, 08/08/2020   Pneumococcal Conjugate-13 08/07/2015   Pneumococcal Polysaccharide-23 04/17/2013, 07/18/2013   Td 12/25/2018   Zoster, Live 04/17/2013     Objective: Vital Signs: There were no vitals taken for this visit.   Physical Exam   Musculoskeletal Exam: ***  CDAI Exam: CDAI Score: -- Patient Global: --; Provider Global: -- Swollen: --; Tender: -- Joint Exam 05/05/2021   No joint exam has been documented for this visit   There is currently no information documented on the homunculus. Go to the Rheumatology activity and complete the homunculus joint exam.  Investigation: No additional findings.  Imaging: No results found.  Recent Labs: Lab Results  Component Value Date   WBC 8.0 03/05/2021   HGB 8.4 (L) 03/05/2021   PLT 311 03/05/2021   NA 137 03/09/2021   K 3.8 03/09/2021   CL 111 03/09/2021   CO2 20 (L) 03/09/2021    GLUCOSE 53 (L) 03/09/2021   BUN 19 03/09/2021   CREATININE 1.29 (H) 03/09/2021   BILITOT 0.8 03/09/2021   ALKPHOS 44 03/09/2021   AST 15 03/09/2021   ALT 12 03/09/2021   PROT 5.8 (L) 03/09/2021   ALBUMIN 2.8 (L) 03/09/2021   CALCIUM 8.9 03/09/2021   GFRAA 48 (L) 07/15/2020   QFTBGOLDPLUS NEGATIVE 04/17/2020    Speciality Comments: No specialty comments available.  Procedures:  No procedures performed Allergies: Fluzone quadrivalent [influenza vac split quad], Influenza vaccines, Aspirin, Atorvastatin, Canagliflozin, and Statins   Assessment / Plan:     Visit Diagnoses: No diagnosis found.  Orders: No orders of the defined types were placed in this encounter.  No orders of the defined types were placed in this encounter.   Face-to-face time spent with patient was *** minutes. Greater than 50% of time was spent in counseling and coordination of care.  Follow-Up Instructions: No follow-ups on file.   Earnestine Mealing, CMA  Note - This record has been created using Editor, commissioning.  Chart creation errors have been sought, but may not always  have been located. Such creation errors do not reflect on  the standard of  medical care.

## 2021-04-21 NOTE — Telephone Encounter (Signed)
OK 

## 2021-04-21 NOTE — Telephone Encounter (Signed)
Request verbal to extnend OT  twice a week for 2 weeks once a week for two weeks.. Also reporting patient had a recent fall on Saturday.  Okay to leave a message

## 2021-04-21 NOTE — Telephone Encounter (Signed)
Verbal ok given. Will send note to PCP regarding fall

## 2021-04-22 DIAGNOSIS — E1122 Type 2 diabetes mellitus with diabetic chronic kidney disease: Secondary | ICD-10-CM | POA: Diagnosis not present

## 2021-04-22 DIAGNOSIS — T8484XD Pain due to internal orthopedic prosthetic devices, implants and grafts, subsequent encounter: Secondary | ICD-10-CM | POA: Diagnosis not present

## 2021-04-22 DIAGNOSIS — N183 Chronic kidney disease, stage 3 unspecified: Secondary | ICD-10-CM | POA: Diagnosis not present

## 2021-04-22 DIAGNOSIS — E1165 Type 2 diabetes mellitus with hyperglycemia: Secondary | ICD-10-CM | POA: Diagnosis not present

## 2021-04-22 DIAGNOSIS — G8929 Other chronic pain: Secondary | ICD-10-CM | POA: Diagnosis not present

## 2021-04-22 DIAGNOSIS — M069 Rheumatoid arthritis, unspecified: Secondary | ICD-10-CM | POA: Diagnosis not present

## 2021-04-22 DIAGNOSIS — I350 Nonrheumatic aortic (valve) stenosis: Secondary | ICD-10-CM | POA: Diagnosis not present

## 2021-04-22 DIAGNOSIS — E1142 Type 2 diabetes mellitus with diabetic polyneuropathy: Secondary | ICD-10-CM | POA: Diagnosis not present

## 2021-04-22 DIAGNOSIS — I129 Hypertensive chronic kidney disease with stage 1 through stage 4 chronic kidney disease, or unspecified chronic kidney disease: Secondary | ICD-10-CM | POA: Diagnosis not present

## 2021-04-22 DIAGNOSIS — I959 Hypotension, unspecified: Secondary | ICD-10-CM | POA: Diagnosis not present

## 2021-04-27 DIAGNOSIS — M069 Rheumatoid arthritis, unspecified: Secondary | ICD-10-CM | POA: Diagnosis not present

## 2021-04-27 DIAGNOSIS — T8484XD Pain due to internal orthopedic prosthetic devices, implants and grafts, subsequent encounter: Secondary | ICD-10-CM | POA: Diagnosis not present

## 2021-04-27 DIAGNOSIS — E1142 Type 2 diabetes mellitus with diabetic polyneuropathy: Secondary | ICD-10-CM | POA: Diagnosis not present

## 2021-04-27 DIAGNOSIS — E1165 Type 2 diabetes mellitus with hyperglycemia: Secondary | ICD-10-CM | POA: Diagnosis not present

## 2021-04-27 DIAGNOSIS — I129 Hypertensive chronic kidney disease with stage 1 through stage 4 chronic kidney disease, or unspecified chronic kidney disease: Secondary | ICD-10-CM | POA: Diagnosis not present

## 2021-04-27 DIAGNOSIS — I350 Nonrheumatic aortic (valve) stenosis: Secondary | ICD-10-CM | POA: Diagnosis not present

## 2021-04-27 DIAGNOSIS — N183 Chronic kidney disease, stage 3 unspecified: Secondary | ICD-10-CM | POA: Diagnosis not present

## 2021-04-27 DIAGNOSIS — G8929 Other chronic pain: Secondary | ICD-10-CM | POA: Diagnosis not present

## 2021-04-27 DIAGNOSIS — E1122 Type 2 diabetes mellitus with diabetic chronic kidney disease: Secondary | ICD-10-CM | POA: Diagnosis not present

## 2021-04-27 DIAGNOSIS — I959 Hypotension, unspecified: Secondary | ICD-10-CM | POA: Diagnosis not present

## 2021-04-28 ENCOUNTER — Other Ambulatory Visit: Payer: Self-pay

## 2021-04-28 ENCOUNTER — Ambulatory Visit (INDEPENDENT_AMBULATORY_CARE_PROVIDER_SITE_OTHER): Payer: Medicare HMO | Admitting: Family Medicine

## 2021-04-28 ENCOUNTER — Encounter: Payer: Self-pay | Admitting: Family Medicine

## 2021-04-28 VITALS — BP 124/82 | HR 79 | Temp 98.2°F | Ht 70.5 in | Wt 228.0 lb

## 2021-04-28 DIAGNOSIS — E785 Hyperlipidemia, unspecified: Secondary | ICD-10-CM

## 2021-04-28 DIAGNOSIS — I10 Essential (primary) hypertension: Secondary | ICD-10-CM

## 2021-04-28 DIAGNOSIS — L570 Actinic keratosis: Secondary | ICD-10-CM

## 2021-04-28 DIAGNOSIS — E1169 Type 2 diabetes mellitus with other specified complication: Secondary | ICD-10-CM | POA: Diagnosis not present

## 2021-04-28 MED ORDER — LISINOPRIL 20 MG PO TABS: 20.0000 mg | ORAL_TABLET | Freq: Every day | ORAL | 3 refills | Status: DC

## 2021-04-28 NOTE — Patient Instructions (Signed)
Keep the diet clean and stay active.  Give us 2-3 business days to get the results of your labs back.   Let us know if you need anything.  

## 2021-04-28 NOTE — Progress Notes (Signed)
Subjective:   Chief Complaint  Patient presents with   Follow-up    Tommy Green is a 75 y.o. male here for follow-up of diabetes.   Paddy's self monitored glucose range is low 100's.  Patient denies hypoglycemic reactions. He checks his glucose levels 2 time(s) per day. Patient does require insulin- Tresiba 30 u qhs, Humalog 10 u or less per meal on sliding scal.   Medications include: no PO meds Diet is fair.  Exercise: physical therapy  Hypertension Patient presents for hypertension follow up. He does monitor home blood pressures. Blood pressures ranging on average from 110's/70's. He is compliant with medication- Norvasc 5 mg/d, lisinopril 20 mg/d. Patient has these side effects of medication: none Diet/exercise as above.  No CP or SOB.   New lesions on scalp, hx of AK's. Requesting freezing.   Past Medical History:  Diagnosis Date   Anemia    Essential hypertension 07/18/2017   History of colonic polyps    History of rheumatic fever    History of stroke 07/18/2017   Hypertriglyceridemia 07/18/2017   Type 2 diabetes mellitus with diabetic neuropathy, unspecified ([place]) 07/18/2017     Related testing: Retinal exam: Done Pneumovax: done  Objective:  BP 124/82   Pulse 79   Temp 98.2 F (36.8 C) (Oral)   Ht 5' 10.5" (1.791 m)   Wt 228 lb (103.4 kg)   SpO2 96%   BMI 32.25 kg/m  General:  Well developed, well nourished, in no apparent distress Skin: Various scaly and slightly pinkish/erythematous lesions/patches over his scalp and temporal region; there is no drainage, fluctuance, excessive warmth or excoriation Head:  Normocephalic, atraumatic Eyes:  Pupils equal and round, sclera anicteric without injection  Lungs:  CTAB, no access msc use Cardio:  RRR, no bruits, around ankles are pitting 2+ b/l LE edema Psych: Age appropriate judgment and insight  Procedure note: cryotherapy Verbal consent obtained 13 skin lesions treated Liquid nitrogen was applied  via a thin spray creating an ice ball with 1-2 mm corona surrounding the lesion The patient tolerated the procedure well There were no immediate complications noted  Assessment:   Hyperlipidemia associated with type 2 diabetes mellitus ([place]) - Plan: Hemoglobin A1c, Lipid panel, Comprehensive metabolic panel, Microalbumin / creatinine urine ratio  Actinic keratoses   Plan:   Counseled on diet and exercise. F/u in 3-6 mo. The patient voiced understanding and agreement to the plan.  [place], DO 04/28/21 2:55 PM

## 2021-04-29 DIAGNOSIS — Z4789 Encounter for other orthopedic aftercare: Secondary | ICD-10-CM | POA: Diagnosis not present

## 2021-04-29 DIAGNOSIS — M4325 Fusion of spine, thoracolumbar region: Secondary | ICD-10-CM | POA: Diagnosis not present

## 2021-04-29 DIAGNOSIS — M21372 Foot drop, left foot: Secondary | ICD-10-CM | POA: Diagnosis not present

## 2021-04-29 DIAGNOSIS — Z79899 Other long term (current) drug therapy: Secondary | ICD-10-CM | POA: Diagnosis not present

## 2021-04-30 DIAGNOSIS — M069 Rheumatoid arthritis, unspecified: Secondary | ICD-10-CM | POA: Diagnosis not present

## 2021-04-30 DIAGNOSIS — E1142 Type 2 diabetes mellitus with diabetic polyneuropathy: Secondary | ICD-10-CM | POA: Diagnosis not present

## 2021-04-30 DIAGNOSIS — E1165 Type 2 diabetes mellitus with hyperglycemia: Secondary | ICD-10-CM | POA: Diagnosis not present

## 2021-04-30 DIAGNOSIS — I350 Nonrheumatic aortic (valve) stenosis: Secondary | ICD-10-CM | POA: Diagnosis not present

## 2021-04-30 DIAGNOSIS — T8484XD Pain due to internal orthopedic prosthetic devices, implants and grafts, subsequent encounter: Secondary | ICD-10-CM | POA: Diagnosis not present

## 2021-04-30 DIAGNOSIS — I959 Hypotension, unspecified: Secondary | ICD-10-CM | POA: Diagnosis not present

## 2021-04-30 DIAGNOSIS — E1122 Type 2 diabetes mellitus with diabetic chronic kidney disease: Secondary | ICD-10-CM | POA: Diagnosis not present

## 2021-04-30 DIAGNOSIS — N183 Chronic kidney disease, stage 3 unspecified: Secondary | ICD-10-CM | POA: Diagnosis not present

## 2021-04-30 DIAGNOSIS — G8929 Other chronic pain: Secondary | ICD-10-CM | POA: Diagnosis not present

## 2021-04-30 DIAGNOSIS — I129 Hypertensive chronic kidney disease with stage 1 through stage 4 chronic kidney disease, or unspecified chronic kidney disease: Secondary | ICD-10-CM | POA: Diagnosis not present

## 2021-05-01 DIAGNOSIS — M069 Rheumatoid arthritis, unspecified: Secondary | ICD-10-CM | POA: Diagnosis not present

## 2021-05-01 DIAGNOSIS — E1165 Type 2 diabetes mellitus with hyperglycemia: Secondary | ICD-10-CM | POA: Diagnosis not present

## 2021-05-01 DIAGNOSIS — I350 Nonrheumatic aortic (valve) stenosis: Secondary | ICD-10-CM | POA: Diagnosis not present

## 2021-05-01 DIAGNOSIS — E1142 Type 2 diabetes mellitus with diabetic polyneuropathy: Secondary | ICD-10-CM | POA: Diagnosis not present

## 2021-05-01 DIAGNOSIS — T8484XD Pain due to internal orthopedic prosthetic devices, implants and grafts, subsequent encounter: Secondary | ICD-10-CM | POA: Diagnosis not present

## 2021-05-01 DIAGNOSIS — E1122 Type 2 diabetes mellitus with diabetic chronic kidney disease: Secondary | ICD-10-CM | POA: Diagnosis not present

## 2021-05-01 DIAGNOSIS — I129 Hypertensive chronic kidney disease with stage 1 through stage 4 chronic kidney disease, or unspecified chronic kidney disease: Secondary | ICD-10-CM | POA: Diagnosis not present

## 2021-05-01 DIAGNOSIS — G8929 Other chronic pain: Secondary | ICD-10-CM | POA: Diagnosis not present

## 2021-05-01 DIAGNOSIS — N183 Chronic kidney disease, stage 3 unspecified: Secondary | ICD-10-CM | POA: Diagnosis not present

## 2021-05-01 DIAGNOSIS — I959 Hypotension, unspecified: Secondary | ICD-10-CM | POA: Diagnosis not present

## 2021-05-04 ENCOUNTER — Other Ambulatory Visit (INDEPENDENT_AMBULATORY_CARE_PROVIDER_SITE_OTHER): Payer: Medicare HMO

## 2021-05-04 ENCOUNTER — Other Ambulatory Visit: Payer: Self-pay | Admitting: Family Medicine

## 2021-05-04 DIAGNOSIS — E1142 Type 2 diabetes mellitus with diabetic polyneuropathy: Secondary | ICD-10-CM | POA: Diagnosis not present

## 2021-05-04 DIAGNOSIS — G8929 Other chronic pain: Secondary | ICD-10-CM | POA: Diagnosis not present

## 2021-05-04 DIAGNOSIS — N183 Chronic kidney disease, stage 3 unspecified: Secondary | ICD-10-CM | POA: Diagnosis not present

## 2021-05-04 DIAGNOSIS — I959 Hypotension, unspecified: Secondary | ICD-10-CM | POA: Diagnosis not present

## 2021-05-04 DIAGNOSIS — E1169 Type 2 diabetes mellitus with other specified complication: Secondary | ICD-10-CM | POA: Diagnosis not present

## 2021-05-04 DIAGNOSIS — I129 Hypertensive chronic kidney disease with stage 1 through stage 4 chronic kidney disease, or unspecified chronic kidney disease: Secondary | ICD-10-CM | POA: Diagnosis not present

## 2021-05-04 DIAGNOSIS — E785 Hyperlipidemia, unspecified: Secondary | ICD-10-CM | POA: Diagnosis not present

## 2021-05-04 DIAGNOSIS — M069 Rheumatoid arthritis, unspecified: Secondary | ICD-10-CM | POA: Diagnosis not present

## 2021-05-04 DIAGNOSIS — T8484XD Pain due to internal orthopedic prosthetic devices, implants and grafts, subsequent encounter: Secondary | ICD-10-CM | POA: Diagnosis not present

## 2021-05-04 DIAGNOSIS — E1165 Type 2 diabetes mellitus with hyperglycemia: Secondary | ICD-10-CM | POA: Diagnosis not present

## 2021-05-04 DIAGNOSIS — I350 Nonrheumatic aortic (valve) stenosis: Secondary | ICD-10-CM | POA: Diagnosis not present

## 2021-05-04 DIAGNOSIS — E1122 Type 2 diabetes mellitus with diabetic chronic kidney disease: Secondary | ICD-10-CM | POA: Diagnosis not present

## 2021-05-04 LAB — LIPID PANEL
Cholesterol: 187 mg/dL (ref 0–200)
HDL: 28.1 mg/dL — ABNORMAL LOW (ref >39.00)
NonHDL: 159.21
Total CHOL/HDL Ratio: 7
Triglycerides: 315 mg/dL — ABNORMAL HIGH (ref 0.0–149.0)
VLDL: 63 mg/dL — ABNORMAL HIGH (ref 0.0–40.0)

## 2021-05-04 LAB — COMPREHENSIVE METABOLIC PANEL
ALT: 11 U/L (ref 0–53)
AST: 17 U/L (ref 0–37)
Albumin: 4.1 g/dL (ref 3.5–5.2)
Alkaline Phosphatase: 31 U/L — ABNORMAL LOW (ref 39–117)
BUN: 16 mg/dL (ref 6–23)
CO2: 24 mEq/L (ref 19–32)
Calcium: 9.3 mg/dL (ref 8.4–10.5)
Chloride: 104 mEq/L (ref 96–112)
Creatinine, Ser: 1.12 mg/dL (ref 0.40–1.50)
GFR: 63.46 mL/min (ref >60.00)
Glucose, Bld: 100 mg/dL — ABNORMAL HIGH (ref 70–99)
Potassium: 4.4 mEq/L (ref 3.5–5.1)
Sodium: 139 mEq/L (ref 135–145)
Total Bilirubin: 0.5 mg/dL (ref 0.2–1.2)
Total Protein: 6.4 g/dL (ref 6.0–8.3)

## 2021-05-04 LAB — LDL CHOLESTEROL, DIRECT: Direct LDL: 107 mg/dL

## 2021-05-04 LAB — MICROALBUMIN / CREATININE URINE RATIO
Creatinine,U: 52 mg/dL
Microalb Creat Ratio: 11.7 mg/g (ref 0.0–30.0)
Microalb, Ur: 6.1 mg/dL — ABNORMAL HIGH (ref 0.0–1.9)

## 2021-05-04 LAB — HEMOGLOBIN A1C: Hgb A1c MFr Bld: 6.4 % (ref 4.6–6.5)

## 2021-05-04 MED ORDER — EZETIMIBE 10 MG PO TABS: 10.0000 mg | ORAL_TABLET | Freq: Every day | ORAL | 3 refills | Status: DC

## 2021-05-04 NOTE — Addendum Note (Signed)
Addended by: Kelle Darting A on: 05/04/2021 08:49 AM   Modules accepted: Orders

## 2021-05-05 ENCOUNTER — Ambulatory Visit: Payer: Medicare HMO | Admitting: Physician Assistant

## 2021-05-05 ENCOUNTER — Telehealth: Payer: Self-pay | Admitting: Family Medicine

## 2021-05-05 DIAGNOSIS — E1165 Type 2 diabetes mellitus with hyperglycemia: Secondary | ICD-10-CM | POA: Diagnosis not present

## 2021-05-05 DIAGNOSIS — N183 Chronic kidney disease, stage 3 unspecified: Secondary | ICD-10-CM | POA: Diagnosis not present

## 2021-05-05 DIAGNOSIS — G8929 Other chronic pain: Secondary | ICD-10-CM | POA: Diagnosis not present

## 2021-05-05 DIAGNOSIS — I350 Nonrheumatic aortic (valve) stenosis: Secondary | ICD-10-CM | POA: Diagnosis not present

## 2021-05-05 DIAGNOSIS — I129 Hypertensive chronic kidney disease with stage 1 through stage 4 chronic kidney disease, or unspecified chronic kidney disease: Secondary | ICD-10-CM | POA: Diagnosis not present

## 2021-05-05 DIAGNOSIS — E1142 Type 2 diabetes mellitus with diabetic polyneuropathy: Secondary | ICD-10-CM | POA: Diagnosis not present

## 2021-05-05 DIAGNOSIS — M069 Rheumatoid arthritis, unspecified: Secondary | ICD-10-CM | POA: Diagnosis not present

## 2021-05-05 DIAGNOSIS — E1122 Type 2 diabetes mellitus with diabetic chronic kidney disease: Secondary | ICD-10-CM | POA: Diagnosis not present

## 2021-05-05 DIAGNOSIS — T8484XD Pain due to internal orthopedic prosthetic devices, implants and grafts, subsequent encounter: Secondary | ICD-10-CM | POA: Diagnosis not present

## 2021-05-05 DIAGNOSIS — I959 Hypotension, unspecified: Secondary | ICD-10-CM | POA: Diagnosis not present

## 2021-05-05 NOTE — Telephone Encounter (Signed)
Patient wife's states they will no be able to get medication is $175

## 2021-05-06 MED ORDER — OMEGA-3-ACID ETHYL ESTERS 1 G PO CAPS: 1.0000 g | ORAL_CAPSULE | Freq: Two times a day (BID) | ORAL | 3 refills | Status: DC

## 2021-05-06 NOTE — Telephone Encounter (Signed)
We have not rep. PCP sent in [place]. Will let the patient know.

## 2021-05-06 NOTE — Telephone Encounter (Signed)
ok 

## 2021-05-06 NOTE — Telephone Encounter (Signed)
Called and spoke to the patient informed of PCP instructions. He stated he will do the fish oil, but not interested in lipid clinic he said he went there a few years back and not interested in going there again

## 2021-05-06 NOTE — Telephone Encounter (Signed)
Please inform patient I do not have great options if insurance won't cover the rx fish oil and he does not tolerate statins. I would like him to see the lipid clinic. Please place referral to cardiology/lipid clinic, dx hypertriglyceridemia. Ty.

## 2021-05-06 NOTE — Telephone Encounter (Signed)
Can we reach out to [facility] rep to see if we can get some samples?

## 2021-05-07 DIAGNOSIS — M4320 Fusion of spine, site unspecified: Secondary | ICD-10-CM | POA: Diagnosis not present

## 2021-05-07 DIAGNOSIS — N183 Chronic kidney disease, stage 3 unspecified: Secondary | ICD-10-CM | POA: Diagnosis not present

## 2021-05-07 DIAGNOSIS — E1122 Type 2 diabetes mellitus with diabetic chronic kidney disease: Secondary | ICD-10-CM | POA: Diagnosis not present

## 2021-05-07 DIAGNOSIS — I350 Nonrheumatic aortic (valve) stenosis: Secondary | ICD-10-CM | POA: Diagnosis not present

## 2021-05-07 DIAGNOSIS — E1142 Type 2 diabetes mellitus with diabetic polyneuropathy: Secondary | ICD-10-CM | POA: Diagnosis not present

## 2021-05-07 DIAGNOSIS — M069 Rheumatoid arthritis, unspecified: Secondary | ICD-10-CM | POA: Diagnosis not present

## 2021-05-07 DIAGNOSIS — T8484XD Pain due to internal orthopedic prosthetic devices, implants and grafts, subsequent encounter: Secondary | ICD-10-CM | POA: Diagnosis not present

## 2021-05-07 DIAGNOSIS — I959 Hypotension, unspecified: Secondary | ICD-10-CM | POA: Diagnosis not present

## 2021-05-07 DIAGNOSIS — I129 Hypertensive chronic kidney disease with stage 1 through stage 4 chronic kidney disease, or unspecified chronic kidney disease: Secondary | ICD-10-CM | POA: Diagnosis not present

## 2021-05-07 DIAGNOSIS — E1165 Type 2 diabetes mellitus with hyperglycemia: Secondary | ICD-10-CM | POA: Diagnosis not present

## 2021-05-07 DIAGNOSIS — G8929 Other chronic pain: Secondary | ICD-10-CM | POA: Diagnosis not present

## 2021-05-11 ENCOUNTER — Telehealth: Payer: Self-pay | Admitting: Family Medicine

## 2021-05-11 DIAGNOSIS — G8929 Other chronic pain: Secondary | ICD-10-CM | POA: Diagnosis not present

## 2021-05-11 DIAGNOSIS — I350 Nonrheumatic aortic (valve) stenosis: Secondary | ICD-10-CM | POA: Diagnosis not present

## 2021-05-11 DIAGNOSIS — E1165 Type 2 diabetes mellitus with hyperglycemia: Secondary | ICD-10-CM | POA: Diagnosis not present

## 2021-05-11 DIAGNOSIS — N183 Chronic kidney disease, stage 3 unspecified: Secondary | ICD-10-CM | POA: Diagnosis not present

## 2021-05-11 DIAGNOSIS — E1142 Type 2 diabetes mellitus with diabetic polyneuropathy: Secondary | ICD-10-CM | POA: Diagnosis not present

## 2021-05-11 DIAGNOSIS — T8484XD Pain due to internal orthopedic prosthetic devices, implants and grafts, subsequent encounter: Secondary | ICD-10-CM | POA: Diagnosis not present

## 2021-05-11 DIAGNOSIS — E1122 Type 2 diabetes mellitus with diabetic chronic kidney disease: Secondary | ICD-10-CM | POA: Diagnosis not present

## 2021-05-11 DIAGNOSIS — M069 Rheumatoid arthritis, unspecified: Secondary | ICD-10-CM | POA: Diagnosis not present

## 2021-05-11 DIAGNOSIS — I959 Hypotension, unspecified: Secondary | ICD-10-CM | POA: Diagnosis not present

## 2021-05-11 DIAGNOSIS — I129 Hypertensive chronic kidney disease with stage 1 through stage 4 chronic kidney disease, or unspecified chronic kidney disease: Secondary | ICD-10-CM | POA: Diagnosis not present

## 2021-05-11 NOTE — Telephone Encounter (Signed)
Trish from [facility] is needing orders to extend physical therapy for the pt. Twice a week for 6 more weeks starting next week.. requesting verbal order please call Trish at [phone].

## 2021-05-11 NOTE — Telephone Encounter (Signed)
Called left detailed message (verbal ok per PCP).

## 2021-05-12 DIAGNOSIS — I129 Hypertensive chronic kidney disease with stage 1 through stage 4 chronic kidney disease, or unspecified chronic kidney disease: Secondary | ICD-10-CM | POA: Diagnosis not present

## 2021-05-12 DIAGNOSIS — E1122 Type 2 diabetes mellitus with diabetic chronic kidney disease: Secondary | ICD-10-CM | POA: Diagnosis not present

## 2021-05-12 DIAGNOSIS — T8484XD Pain due to internal orthopedic prosthetic devices, implants and grafts, subsequent encounter: Secondary | ICD-10-CM | POA: Diagnosis not present

## 2021-05-12 DIAGNOSIS — M069 Rheumatoid arthritis, unspecified: Secondary | ICD-10-CM | POA: Diagnosis not present

## 2021-05-12 DIAGNOSIS — N183 Chronic kidney disease, stage 3 unspecified: Secondary | ICD-10-CM | POA: Diagnosis not present

## 2021-05-12 DIAGNOSIS — I959 Hypotension, unspecified: Secondary | ICD-10-CM | POA: Diagnosis not present

## 2021-05-12 DIAGNOSIS — E1165 Type 2 diabetes mellitus with hyperglycemia: Secondary | ICD-10-CM | POA: Diagnosis not present

## 2021-05-12 DIAGNOSIS — E1142 Type 2 diabetes mellitus with diabetic polyneuropathy: Secondary | ICD-10-CM | POA: Diagnosis not present

## 2021-05-12 DIAGNOSIS — G8929 Other chronic pain: Secondary | ICD-10-CM | POA: Diagnosis not present

## 2021-05-12 DIAGNOSIS — I350 Nonrheumatic aortic (valve) stenosis: Secondary | ICD-10-CM | POA: Diagnosis not present

## 2021-05-13 DIAGNOSIS — G8929 Other chronic pain: Secondary | ICD-10-CM | POA: Diagnosis not present

## 2021-05-13 DIAGNOSIS — T8484XD Pain due to internal orthopedic prosthetic devices, implants and grafts, subsequent encounter: Secondary | ICD-10-CM | POA: Diagnosis not present

## 2021-05-13 DIAGNOSIS — E1142 Type 2 diabetes mellitus with diabetic polyneuropathy: Secondary | ICD-10-CM | POA: Diagnosis not present

## 2021-05-13 DIAGNOSIS — I350 Nonrheumatic aortic (valve) stenosis: Secondary | ICD-10-CM | POA: Diagnosis not present

## 2021-05-13 DIAGNOSIS — E1122 Type 2 diabetes mellitus with diabetic chronic kidney disease: Secondary | ICD-10-CM | POA: Diagnosis not present

## 2021-05-13 DIAGNOSIS — N183 Chronic kidney disease, stage 3 unspecified: Secondary | ICD-10-CM | POA: Diagnosis not present

## 2021-05-13 DIAGNOSIS — M069 Rheumatoid arthritis, unspecified: Secondary | ICD-10-CM | POA: Diagnosis not present

## 2021-05-13 DIAGNOSIS — I959 Hypotension, unspecified: Secondary | ICD-10-CM | POA: Diagnosis not present

## 2021-05-13 DIAGNOSIS — E1165 Type 2 diabetes mellitus with hyperglycemia: Secondary | ICD-10-CM | POA: Diagnosis not present

## 2021-05-13 DIAGNOSIS — I129 Hypertensive chronic kidney disease with stage 1 through stage 4 chronic kidney disease, or unspecified chronic kidney disease: Secondary | ICD-10-CM | POA: Diagnosis not present

## 2021-05-19 DIAGNOSIS — G8929 Other chronic pain: Secondary | ICD-10-CM | POA: Diagnosis not present

## 2021-05-19 DIAGNOSIS — I350 Nonrheumatic aortic (valve) stenosis: Secondary | ICD-10-CM | POA: Diagnosis not present

## 2021-05-19 DIAGNOSIS — I959 Hypotension, unspecified: Secondary | ICD-10-CM | POA: Diagnosis not present

## 2021-05-19 DIAGNOSIS — T8484XD Pain due to internal orthopedic prosthetic devices, implants and grafts, subsequent encounter: Secondary | ICD-10-CM | POA: Diagnosis not present

## 2021-05-19 DIAGNOSIS — I129 Hypertensive chronic kidney disease with stage 1 through stage 4 chronic kidney disease, or unspecified chronic kidney disease: Secondary | ICD-10-CM | POA: Diagnosis not present

## 2021-05-19 DIAGNOSIS — E1165 Type 2 diabetes mellitus with hyperglycemia: Secondary | ICD-10-CM | POA: Diagnosis not present

## 2021-05-19 DIAGNOSIS — M069 Rheumatoid arthritis, unspecified: Secondary | ICD-10-CM | POA: Diagnosis not present

## 2021-05-19 DIAGNOSIS — E1142 Type 2 diabetes mellitus with diabetic polyneuropathy: Secondary | ICD-10-CM | POA: Diagnosis not present

## 2021-05-19 DIAGNOSIS — N183 Chronic kidney disease, stage 3 unspecified: Secondary | ICD-10-CM | POA: Diagnosis not present

## 2021-05-19 DIAGNOSIS — E1122 Type 2 diabetes mellitus with diabetic chronic kidney disease: Secondary | ICD-10-CM | POA: Diagnosis not present

## 2021-05-20 DIAGNOSIS — T8484XD Pain due to internal orthopedic prosthetic devices, implants and grafts, subsequent encounter: Secondary | ICD-10-CM | POA: Diagnosis not present

## 2021-05-20 DIAGNOSIS — E1165 Type 2 diabetes mellitus with hyperglycemia: Secondary | ICD-10-CM | POA: Diagnosis not present

## 2021-05-20 DIAGNOSIS — G8929 Other chronic pain: Secondary | ICD-10-CM | POA: Diagnosis not present

## 2021-05-20 DIAGNOSIS — E1142 Type 2 diabetes mellitus with diabetic polyneuropathy: Secondary | ICD-10-CM | POA: Diagnosis not present

## 2021-05-20 DIAGNOSIS — I350 Nonrheumatic aortic (valve) stenosis: Secondary | ICD-10-CM | POA: Diagnosis not present

## 2021-05-20 DIAGNOSIS — I129 Hypertensive chronic kidney disease with stage 1 through stage 4 chronic kidney disease, or unspecified chronic kidney disease: Secondary | ICD-10-CM | POA: Diagnosis not present

## 2021-05-20 DIAGNOSIS — N183 Chronic kidney disease, stage 3 unspecified: Secondary | ICD-10-CM | POA: Diagnosis not present

## 2021-05-20 DIAGNOSIS — E1122 Type 2 diabetes mellitus with diabetic chronic kidney disease: Secondary | ICD-10-CM | POA: Diagnosis not present

## 2021-05-20 DIAGNOSIS — I959 Hypotension, unspecified: Secondary | ICD-10-CM | POA: Diagnosis not present

## 2021-05-20 DIAGNOSIS — M069 Rheumatoid arthritis, unspecified: Secondary | ICD-10-CM | POA: Diagnosis not present

## 2021-05-21 DIAGNOSIS — N183 Chronic kidney disease, stage 3 unspecified: Secondary | ICD-10-CM | POA: Diagnosis not present

## 2021-05-21 DIAGNOSIS — E1142 Type 2 diabetes mellitus with diabetic polyneuropathy: Secondary | ICD-10-CM | POA: Diagnosis not present

## 2021-05-21 DIAGNOSIS — G8929 Other chronic pain: Secondary | ICD-10-CM | POA: Diagnosis not present

## 2021-05-21 DIAGNOSIS — E1165 Type 2 diabetes mellitus with hyperglycemia: Secondary | ICD-10-CM | POA: Diagnosis not present

## 2021-05-21 DIAGNOSIS — I350 Nonrheumatic aortic (valve) stenosis: Secondary | ICD-10-CM | POA: Diagnosis not present

## 2021-05-21 DIAGNOSIS — M069 Rheumatoid arthritis, unspecified: Secondary | ICD-10-CM | POA: Diagnosis not present

## 2021-05-21 DIAGNOSIS — I129 Hypertensive chronic kidney disease with stage 1 through stage 4 chronic kidney disease, or unspecified chronic kidney disease: Secondary | ICD-10-CM | POA: Diagnosis not present

## 2021-05-21 DIAGNOSIS — T8484XD Pain due to internal orthopedic prosthetic devices, implants and grafts, subsequent encounter: Secondary | ICD-10-CM | POA: Diagnosis not present

## 2021-05-21 DIAGNOSIS — E1122 Type 2 diabetes mellitus with diabetic chronic kidney disease: Secondary | ICD-10-CM | POA: Diagnosis not present

## 2021-05-21 DIAGNOSIS — I959 Hypotension, unspecified: Secondary | ICD-10-CM | POA: Diagnosis not present

## 2021-05-23 ENCOUNTER — Other Ambulatory Visit: Payer: Self-pay | Admitting: Rheumatology

## 2021-05-23 DIAGNOSIS — M0579 Rheumatoid arthritis with rheumatoid factor of multiple sites without organ or systems involvement: Secondary | ICD-10-CM

## 2021-05-25 ENCOUNTER — Telehealth: Payer: Self-pay | Admitting: Family Medicine

## 2021-05-25 DIAGNOSIS — Z6835 Body mass index (BMI) 35.0-35.9, adult: Secondary | ICD-10-CM | POA: Diagnosis not present

## 2021-05-25 DIAGNOSIS — M4325 Fusion of spine, thoracolumbar region: Secondary | ICD-10-CM | POA: Diagnosis not present

## 2021-05-25 DIAGNOSIS — M21372 Foot drop, left foot: Secondary | ICD-10-CM | POA: Diagnosis not present

## 2021-05-25 MED ORDER — OMEPRAZOLE 20 MG PO CPDR: 20.0000 mg | DELAYED_RELEASE_CAPSULE | Freq: Every day | ORAL | 3 refills | Status: DC

## 2021-05-25 NOTE — Telephone Encounter (Signed)
Patient wife requesting a refill for him  Mail order is cheaper  omeprazole (PRILOSEC) 20 MG capsule [id number]

## 2021-05-25 NOTE — Telephone Encounter (Signed)
Sent in to mail order. 

## 2021-05-25 NOTE — Telephone Encounter (Signed)
Next Visit: 06/16/2021 ( Patient has cancelled last 2 follow up visits)   Last Visit: 09/08/2020   Last Fill: 12/26/2020  Current Dose per office note 09/08/2020, Arava 10 mg 1 tablet by mouth daily   DX: Rheumatoid arthritis involving multiple sites with positive rheumatoid factor   Labs: 05/04/2021 Glucose 100, Alk Phos. 31 CBC: 03/05/2021 RBC 3.18, Hgb 8.4, Hct 27.2, RDW 16.7  Okay to refill Arava?

## 2021-05-26 DIAGNOSIS — E1165 Type 2 diabetes mellitus with hyperglycemia: Secondary | ICD-10-CM | POA: Diagnosis not present

## 2021-05-26 DIAGNOSIS — E1142 Type 2 diabetes mellitus with diabetic polyneuropathy: Secondary | ICD-10-CM | POA: Diagnosis not present

## 2021-05-26 DIAGNOSIS — E1122 Type 2 diabetes mellitus with diabetic chronic kidney disease: Secondary | ICD-10-CM | POA: Diagnosis not present

## 2021-05-26 DIAGNOSIS — I350 Nonrheumatic aortic (valve) stenosis: Secondary | ICD-10-CM | POA: Diagnosis not present

## 2021-05-26 DIAGNOSIS — I129 Hypertensive chronic kidney disease with stage 1 through stage 4 chronic kidney disease, or unspecified chronic kidney disease: Secondary | ICD-10-CM | POA: Diagnosis not present

## 2021-05-26 DIAGNOSIS — N183 Chronic kidney disease, stage 3 unspecified: Secondary | ICD-10-CM | POA: Diagnosis not present

## 2021-05-26 DIAGNOSIS — G8929 Other chronic pain: Secondary | ICD-10-CM | POA: Diagnosis not present

## 2021-05-26 DIAGNOSIS — T8484XD Pain due to internal orthopedic prosthetic devices, implants and grafts, subsequent encounter: Secondary | ICD-10-CM | POA: Diagnosis not present

## 2021-05-26 DIAGNOSIS — I959 Hypotension, unspecified: Secondary | ICD-10-CM | POA: Diagnosis not present

## 2021-05-26 DIAGNOSIS — M069 Rheumatoid arthritis, unspecified: Secondary | ICD-10-CM | POA: Diagnosis not present

## 2021-05-27 DIAGNOSIS — E1142 Type 2 diabetes mellitus with diabetic polyneuropathy: Secondary | ICD-10-CM | POA: Diagnosis not present

## 2021-05-27 DIAGNOSIS — I959 Hypotension, unspecified: Secondary | ICD-10-CM | POA: Diagnosis not present

## 2021-05-27 DIAGNOSIS — I350 Nonrheumatic aortic (valve) stenosis: Secondary | ICD-10-CM | POA: Diagnosis not present

## 2021-05-27 DIAGNOSIS — E1165 Type 2 diabetes mellitus with hyperglycemia: Secondary | ICD-10-CM | POA: Diagnosis not present

## 2021-05-27 DIAGNOSIS — T8484XD Pain due to internal orthopedic prosthetic devices, implants and grafts, subsequent encounter: Secondary | ICD-10-CM | POA: Diagnosis not present

## 2021-05-27 DIAGNOSIS — I129 Hypertensive chronic kidney disease with stage 1 through stage 4 chronic kidney disease, or unspecified chronic kidney disease: Secondary | ICD-10-CM | POA: Diagnosis not present

## 2021-05-27 DIAGNOSIS — M069 Rheumatoid arthritis, unspecified: Secondary | ICD-10-CM | POA: Diagnosis not present

## 2021-05-27 DIAGNOSIS — E1122 Type 2 diabetes mellitus with diabetic chronic kidney disease: Secondary | ICD-10-CM | POA: Diagnosis not present

## 2021-05-27 DIAGNOSIS — N183 Chronic kidney disease, stage 3 unspecified: Secondary | ICD-10-CM | POA: Diagnosis not present

## 2021-05-27 DIAGNOSIS — G8929 Other chronic pain: Secondary | ICD-10-CM | POA: Diagnosis not present

## 2021-05-28 DIAGNOSIS — I959 Hypotension, unspecified: Secondary | ICD-10-CM | POA: Diagnosis not present

## 2021-05-28 DIAGNOSIS — E1122 Type 2 diabetes mellitus with diabetic chronic kidney disease: Secondary | ICD-10-CM | POA: Diagnosis not present

## 2021-05-28 DIAGNOSIS — I350 Nonrheumatic aortic (valve) stenosis: Secondary | ICD-10-CM | POA: Diagnosis not present

## 2021-05-28 DIAGNOSIS — T8484XD Pain due to internal orthopedic prosthetic devices, implants and grafts, subsequent encounter: Secondary | ICD-10-CM | POA: Diagnosis not present

## 2021-05-28 DIAGNOSIS — E1142 Type 2 diabetes mellitus with diabetic polyneuropathy: Secondary | ICD-10-CM | POA: Diagnosis not present

## 2021-05-28 DIAGNOSIS — I129 Hypertensive chronic kidney disease with stage 1 through stage 4 chronic kidney disease, or unspecified chronic kidney disease: Secondary | ICD-10-CM | POA: Diagnosis not present

## 2021-05-28 DIAGNOSIS — M069 Rheumatoid arthritis, unspecified: Secondary | ICD-10-CM | POA: Diagnosis not present

## 2021-05-28 DIAGNOSIS — E1165 Type 2 diabetes mellitus with hyperglycemia: Secondary | ICD-10-CM | POA: Diagnosis not present

## 2021-05-28 DIAGNOSIS — G8929 Other chronic pain: Secondary | ICD-10-CM | POA: Diagnosis not present

## 2021-05-28 DIAGNOSIS — N183 Chronic kidney disease, stage 3 unspecified: Secondary | ICD-10-CM | POA: Diagnosis not present

## 2021-05-30 DIAGNOSIS — Z4789 Encounter for other orthopedic aftercare: Secondary | ICD-10-CM | POA: Diagnosis not present

## 2021-06-02 DIAGNOSIS — I129 Hypertensive chronic kidney disease with stage 1 through stage 4 chronic kidney disease, or unspecified chronic kidney disease: Secondary | ICD-10-CM | POA: Diagnosis not present

## 2021-06-02 DIAGNOSIS — E1142 Type 2 diabetes mellitus with diabetic polyneuropathy: Secondary | ICD-10-CM | POA: Diagnosis not present

## 2021-06-02 DIAGNOSIS — T8484XD Pain due to internal orthopedic prosthetic devices, implants and grafts, subsequent encounter: Secondary | ICD-10-CM | POA: Diagnosis not present

## 2021-06-02 DIAGNOSIS — N183 Chronic kidney disease, stage 3 unspecified: Secondary | ICD-10-CM | POA: Diagnosis not present

## 2021-06-02 DIAGNOSIS — M069 Rheumatoid arthritis, unspecified: Secondary | ICD-10-CM | POA: Diagnosis not present

## 2021-06-02 DIAGNOSIS — E1165 Type 2 diabetes mellitus with hyperglycemia: Secondary | ICD-10-CM | POA: Diagnosis not present

## 2021-06-02 DIAGNOSIS — I350 Nonrheumatic aortic (valve) stenosis: Secondary | ICD-10-CM | POA: Diagnosis not present

## 2021-06-02 DIAGNOSIS — G8929 Other chronic pain: Secondary | ICD-10-CM | POA: Diagnosis not present

## 2021-06-02 DIAGNOSIS — E1122 Type 2 diabetes mellitus with diabetic chronic kidney disease: Secondary | ICD-10-CM | POA: Diagnosis not present

## 2021-06-02 DIAGNOSIS — I959 Hypotension, unspecified: Secondary | ICD-10-CM | POA: Diagnosis not present

## 2021-06-02 NOTE — Progress Notes (Signed)
Office Visit Note  Patient: Tommy Green             Date of Birth: [birth year]           MRN: [id number]             PCP: [name], DO Referring: [name]* Visit Date: 06/16/2021 Occupation: '@GUAROCC'$ @  Subjective:  Medication monitoring   History of Present Illness: Tommy Green is a 75 y.o. male with history of seropositive rheumatoid arthritis and osteoarthritis.  He is on enbrel 50 mg sq injections once weekly and arava 10 mg 1 tablet by mouth daily.  He denies any injection site reactions or side effects. He denies any recent rheumatoid arthritis flares.  He has occasional pain and stiffness in both hands but denies any joint swelling.  He states that he underwent a lumbar fusion on 02/02/2021 and held Enbrel and Arava for 3 to 4 weeks during that time.  He states that the day he returned home from the rehab center he fell and landed on his lower back.  Overall his pain level has continued to improve over time.  He has been working with a home therapist twice a week.  He has been wearing a brace for a left foot drop.  He has been trying to improve his lower extremity strength.  He uses a wheelchair when he leaves the home.  He denies any other recent falls. He denies any recent infections.       Activities of Daily Living:  Patient reports morning stiffness for 30 minutes.   Patient Reports nocturnal pain.  Difficulty dressing/grooming: Denies Difficulty climbing stairs: Reports Difficulty getting out of chair: Reports Difficulty using hands for taps, buttons, cutlery, and/or writing: Denies  Review of Systems  Constitutional:  Positive for fatigue.  HENT:  Negative for mouth sores, mouth dryness and nose dryness.   Eyes:  Positive for dryness. Negative for pain and itching.  Respiratory:  Negative for shortness of breath and difficulty breathing.   Cardiovascular:  Positive for swelling in legs/feet. Negative for chest pain and  palpitations.  Gastrointestinal:  Negative for blood in stool, constipation and diarrhea.  Endocrine: Negative for increased urination.  Genitourinary:  Negative for difficulty urinating.  Musculoskeletal:  Positive for morning stiffness. Negative for joint pain, joint pain, joint swelling, myalgias, muscle tenderness and myalgias.  Skin:  Negative for color change, rash and redness.  Allergic/Immunologic: Positive for susceptible to infections.  Neurological:  Positive for headaches. Negative for dizziness, numbness and memory loss.  Hematological:  Negative for bruising/bleeding tendency.  Psychiatric/Behavioral:  Negative for confusion.    PMFS History:  Patient Active Problem List   Diagnosis Date Noted   Gabapentin overdose, accidental or unintentional, initial encounter 03/08/2021   TIA (transient ischemic attack) 03/05/2021   Body mass index (BMI) 36.0-36.9, adult 10/03/2020   Subdural hemorrhage following injury without open intracranial wound and with prolonged loss of consciousness (more than 24 hours) without return to pre-existing conscious level ([place]) 10/03/2020   Sore throat 12/14/2019   AKI (acute kidney injury) ([place])    Respiratory failure (HCC)    Acute encephalopathy    Hypernatremia    Dyspnea    Acute respiratory failure with hypoxemia (HCC)    SDH (subdural hematoma) ([place]) 10/24/2019   Chronic pain of right knee [id number]   Cyclic vomiting syndrome 08/06/2019   Neuropathy 12/25/2018   Bilateral hip pain 08/24/2018   AK (actinic keratosis) 08/24/2018  Neoplasm of uncertain behavior 0000000   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   Tendinopathy of right gluteus medius 07/13/2018   Tendinopathy of left gluteus medius 07/13/2018   Squamous cell carcinoma in situ (SCCIS) of skin 03/24/2018   Skin lesion 03/17/2018   Vitamin D deficiency 11/29/2017   Stage 3 chronic kidney disease ([place]) 10/28/2017   Primary osteoarthritis of right hip 09/11/2017   Essential  hypertension 07/18/2017   Hyperlipidemia associated with type 2 diabetes mellitus ([place]) 07/18/2017   Type 2 diabetes mellitus with diabetic neuropathy, unspecified ([place]) 07/18/2017   Heart murmur 07/18/2017   Hypertriglyceridemia 07/18/2017   History of stroke 07/18/2017   DDD (degenerative disc disease), lumbar 06/15/2017   Iron deficiency anemia 06/15/2017   Stroke ([place]) 06/15/2017   Encounter for hepatitis C screening test for low risk patient 06/30/2016   Anemia 06/19/2016   Microalbuminuria due to type 2 diabetes mellitus ([place]) 03/19/2016   Hypertrophy of inferior nasal turbinate 12/20/2015   Nasal congestion 12/15/2015   Disease of nasal cavity and sinuses 12/10/2015   Degenerative tear of acetabular labrum of left hip 11/04/2015   Cellulitis and abscess of left leg 09/25/2015   History of tobacco abuse 05/15/2015   Morbid (severe) obesity due to excess calories ([place]) 04/11/2015   Disorder of both eustachian tubes 01/27/2015   Maxillary sinusitis 01/13/2015   Lipoprotein deficiency 01/31/2014   Gastro-esophageal reflux disease without esophagitis 11/02/2013   Rheumatic fever 11/02/2013   Erectile dysfunction associated with type 2 diabetes mellitus ([place]) 07/31/2013   Pain in joint, ankle and foot 02/06/2013   Type 2 diabetes mellitus with hyperglycemia, without long-term current use of insulin ([place]) 11/16/2012    Past Medical History:  Diagnosis Date   Anemia    Essential hypertension 07/18/2017   Foot drop, left foot    due to back surgery in 01/2021   History of colonic polyps    History of rheumatic fever    History of stroke 07/18/2017   Hypertriglyceridemia 07/18/2017   Type 2 diabetes mellitus with diabetic neuropathy, unspecified ([place]) 07/18/2017    Family History  Problem Relation Age of Onset   Hyperlipidemia Mother    Colon polyps Mother    Hyperlipidemia Father    Alcoholism Brother    Healthy Son    Healthy Son    Cancer Neg Hx    Colon cancer Neg Hx     Esophageal cancer Neg Hx    Rectal cancer Neg Hx    Stomach cancer Neg Hx    Past Surgical History:  Procedure Laterality Date   COLONOSCOPY     around 2018 [facility]   COLONOSCOPY  09/02/2020   CRANIOTOMY Left 10/25/2019   Procedure: CRANIOTOMY HEMATOMA EVACUATION SUBDURAL;  Surgeon: Vallarie Mare, MD;  Location: [place];  Service: Neurosurgery;  Laterality: Left;   ESOPHAGOGASTRODUODENOSCOPY     around 2018 with [facility]   FOOT CAPSULOTOMY Left 07/25/2008   Mid Foot #2 MPJ   Hammertoe Repair Left 07/25/2008   #2 toe   SPINAL FUSION  01/2021   TARSAL TUNNEL RELEASE Left 07/25/2008   UPPER GASTROINTESTINAL ENDOSCOPY  09/02/2020   Social History   Social History Narrative   Not on file   Immunization History  Administered Date(s) Administered   Influenza-Unspecified 07/31/2013, 04/17/2014   Moderna Sars-Covid-2 Vaccination 12/11/2019, 01/10/2020, 08/08/2020   Pneumococcal Conjugate-13 08/07/2015   Pneumococcal Polysaccharide-23 04/17/2013, 07/18/2013   Td 12/25/2018   Zoster, Live 04/17/2013     Objective: Vital  Signs: BP 130/79 (BP Location: Right Arm, Patient Position: Sitting, Cuff Size: Normal)   Pulse 69   Ht 5' 10.5" (1.791 m)   Wt 225 lb (102.1 kg) Comment: per patient  BMI 31.83 kg/m    Physical Exam Vitals and nursing note reviewed.  Constitutional:      Appearance: He is well-developed.  HENT:     Head: Normocephalic and atraumatic.  Eyes:     Conjunctiva/sclera: Conjunctivae normal.     Pupils: Pupils are equal, round, and reactive to light.  Pulmonary:     Effort: Pulmonary effort is normal.  Abdominal:     Palpations: Abdomen is soft.  Musculoskeletal:     Cervical back: Normal range of motion and neck supple.  Skin:    General: Skin is warm and dry.     Capillary Refill: Capillary refill takes less than 2 seconds.  Neurological:     Mental Status: He is alert and oriented to person, place, and time.  Psychiatric:         Behavior: Behavior normal.     Musculoskeletal Exam: C-spine has good ROM with no discomfort.  Patient remained in wheelchair during examination.  He had a lumbar fusion in April 2022.   Shoulder joints have good range of motion with no discomfort.  Bilateral elbow joint contractures noted.  Wrist joints have good range of motion with no tenderness or inflammation.  PIP and DIP thickening consistent with osteoarthritis of both hands noted.  No tenderness or synovitis over MCP joints.  Hip joints difficult to assess well in seated position.  Both knee joints have good range of motion with no warmth or effusion.  Ankle joints have good range of motion with no tenderness or joint swelling.  CDAI Exam: CDAI Score: 0.4  Patient Global: 2 mm; Provider Global: 2 mm Swollen: 0 ; Tender: 0  Joint Exam 06/16/2021   No joint exam has been documented for this visit   There is currently no information documented on the homunculus. Go to the Rheumatology activity and complete the homunculus joint exam.  Investigation: No additional findings.  Imaging: No results found.  Recent Labs: Lab Results  Component Value Date   WBC 8.0 03/05/2021   HGB 8.4 (L) 03/05/2021   PLT 311 03/05/2021   NA 139 05/04/2021   K 4.4 05/04/2021   CL 104 05/04/2021   CO2 24 05/04/2021   GLUCOSE 100 (H) 05/04/2021   BUN 16 05/04/2021   CREATININE 1.12 05/04/2021   BILITOT 0.5 05/04/2021   ALKPHOS 31 (L) 05/04/2021   AST 17 05/04/2021   ALT 11 05/04/2021   PROT 6.4 05/04/2021   ALBUMIN 4.1 05/04/2021   CALCIUM 9.3 05/04/2021   GFRAA 48 (L) 07/15/2020   QFTBGOLDPLUS NEGATIVE 04/17/2020    Speciality Comments: No specialty comments available.  Procedures:  No procedures performed Allergies: Fluzone quadrivalent [influenza vac split quad], Influenza vaccines, Aspirin, Atorvastatin, Canagliflozin, and Statins   Assessment / Plan:     Visit Diagnoses: Rheumatoid arthritis involving multiple sites with positive  rheumatoid factor (HCC) - Positive RF, positive anti-CCP, elevated sedimentation rate, history of inflammatory arthritis: He has no joint tenderness or synovitis on examination today.  He has not had any recent rheumatoid arthritis flares.  He has clinically been doing well on combination therapy: Enbrel 50 mg subcutaneous injections once weekly and Arava 10 mg 1 tablet by mouth daily.  He underwent a lumbar fusion by Dr. Patrice Paradise in April 2022 at which time he  held Enbrel and Arava for 3 to 4 weeks.  He had some complications following surgery including some residual left lower extremity weakness and left foot drop but has been working with home therapist twice a week.  His pain level has been tolerable and his strength has been improving.  He is not experiencing any other joint pain or inflammation at this time.  He will remain on the current treatment regimen.  He was advised to notify us if he develops signs or symptoms of a flare.  He will follow-up in the office in 5 months.  High risk medication use - Arava 10 mg 1 tablet by mouth daily and Enbrel mini 50 mg sq injections once weekly. TB gold negative on 04/17/20.  Order for TB gold released.  CBC and CMP will be drawn today to monitor for drug toxicity.  His next lab work will be due in November and every 3 months to monitor for drug toxicity.  Standing orders for CBC and CMP will be placed today.- Plan: CBC with Differential/Platelet, COMPLETE METABOLIC PANEL WITH GFR, QuantiFERON-TB Gold Plus He has not had any recent infections.  We discussed the importance of holding Enbrel and Arava if he develops signs or symptoms of an infection and to resume once the infection has completely cleared.  He voiced understanding.  He was encouraged to receive the annual influenza vaccination. He has a yearly skin examination scheduled next week.  Screening for tuberculosis -Order for TB gold released today. Plan: QuantiFERON-TB Gold Plus  Contracture of right elbow:  unchanged.  No tenderness or inflammation noted.   Primary osteoarthritis of both hips - Moderate to severe osteoarthritis of both hips.  Difficult to assess range of motion in seated position while in the wheelchair.  Primary osteoarthritis of left knee - Mild osteoarthritis.   He has good ROM with no discomfort. No warmth or effusion noted.   Chronic SI joint pain  DDD (degenerative disc disease), lumbar - L4-L5 fusion by Dr.Cohen.  Facet joint arthropathy.   He underwent a lumbar fusion in April 2022.  He had some residual left lower extremity muscle weakness and left foot drop following surgery.  He has been working with a home therapist twice a week and his strength has been improving.  His discomfort has been tolerable.  He uses a wheelchair when he leaves the house and has been using a walker in the home to assist with ambulation.  Other medical conditions are listed as follows:  Essential hypertension: Blood pressure was 130/79 today in the office.  Granuloma annulare  SDH (subdural hematoma) (HCC)  History of stroke - At age 52 with left-sided weakness  Type 2 diabetes mellitus with diabetic neuropathy, with long-term current use of insulin (HCC)  Squamous cell carcinoma in situ (SCCIS) of skin: He has an upcoming skin examination next week.  Heart murmur  Hyperlipidemia associated with type 2 diabetes mellitus ([place])  Orders: Orders Placed This Encounter  Procedures   CBC with Differential/Platelet   COMPLETE METABOLIC PANEL WITH GFR   QuantiFERON-TB Gold Plus   CBC with Differential/Platelet   COMPLETE METABOLIC PANEL WITH GFR   No orders of the defined types were placed in this encounter.     Follow-Up Instructions: Return in about 5 months (around 11/16/2021) for Rheumatoid arthritis.   [name], PA-C  Note - This record has been created using Dragon software.  Chart creation errors have been sought, but may not always  have been located. Such  creation  errors do not reflect on  the standard of medical care.

## 2021-06-04 DIAGNOSIS — N183 Chronic kidney disease, stage 3 unspecified: Secondary | ICD-10-CM | POA: Diagnosis not present

## 2021-06-04 DIAGNOSIS — I959 Hypotension, unspecified: Secondary | ICD-10-CM | POA: Diagnosis not present

## 2021-06-04 DIAGNOSIS — I129 Hypertensive chronic kidney disease with stage 1 through stage 4 chronic kidney disease, or unspecified chronic kidney disease: Secondary | ICD-10-CM | POA: Diagnosis not present

## 2021-06-04 DIAGNOSIS — E1122 Type 2 diabetes mellitus with diabetic chronic kidney disease: Secondary | ICD-10-CM | POA: Diagnosis not present

## 2021-06-04 DIAGNOSIS — M069 Rheumatoid arthritis, unspecified: Secondary | ICD-10-CM | POA: Diagnosis not present

## 2021-06-04 DIAGNOSIS — G8929 Other chronic pain: Secondary | ICD-10-CM | POA: Diagnosis not present

## 2021-06-04 DIAGNOSIS — T8484XD Pain due to internal orthopedic prosthetic devices, implants and grafts, subsequent encounter: Secondary | ICD-10-CM | POA: Diagnosis not present

## 2021-06-04 DIAGNOSIS — I350 Nonrheumatic aortic (valve) stenosis: Secondary | ICD-10-CM | POA: Diagnosis not present

## 2021-06-04 DIAGNOSIS — E1142 Type 2 diabetes mellitus with diabetic polyneuropathy: Secondary | ICD-10-CM | POA: Diagnosis not present

## 2021-06-04 DIAGNOSIS — E1165 Type 2 diabetes mellitus with hyperglycemia: Secondary | ICD-10-CM | POA: Diagnosis not present

## 2021-06-07 DIAGNOSIS — M4320 Fusion of spine, site unspecified: Secondary | ICD-10-CM | POA: Diagnosis not present

## 2021-06-09 DIAGNOSIS — I350 Nonrheumatic aortic (valve) stenosis: Secondary | ICD-10-CM | POA: Diagnosis not present

## 2021-06-09 DIAGNOSIS — N183 Chronic kidney disease, stage 3 unspecified: Secondary | ICD-10-CM | POA: Diagnosis not present

## 2021-06-09 DIAGNOSIS — I129 Hypertensive chronic kidney disease with stage 1 through stage 4 chronic kidney disease, or unspecified chronic kidney disease: Secondary | ICD-10-CM | POA: Diagnosis not present

## 2021-06-09 DIAGNOSIS — I959 Hypotension, unspecified: Secondary | ICD-10-CM | POA: Diagnosis not present

## 2021-06-09 DIAGNOSIS — G8929 Other chronic pain: Secondary | ICD-10-CM | POA: Diagnosis not present

## 2021-06-09 DIAGNOSIS — E1122 Type 2 diabetes mellitus with diabetic chronic kidney disease: Secondary | ICD-10-CM | POA: Diagnosis not present

## 2021-06-09 DIAGNOSIS — T8484XD Pain due to internal orthopedic prosthetic devices, implants and grafts, subsequent encounter: Secondary | ICD-10-CM | POA: Diagnosis not present

## 2021-06-09 DIAGNOSIS — M069 Rheumatoid arthritis, unspecified: Secondary | ICD-10-CM | POA: Diagnosis not present

## 2021-06-09 DIAGNOSIS — E1142 Type 2 diabetes mellitus with diabetic polyneuropathy: Secondary | ICD-10-CM | POA: Diagnosis not present

## 2021-06-09 DIAGNOSIS — E1165 Type 2 diabetes mellitus with hyperglycemia: Secondary | ICD-10-CM | POA: Diagnosis not present

## 2021-06-11 DIAGNOSIS — I350 Nonrheumatic aortic (valve) stenosis: Secondary | ICD-10-CM | POA: Diagnosis not present

## 2021-06-11 DIAGNOSIS — N183 Chronic kidney disease, stage 3 unspecified: Secondary | ICD-10-CM | POA: Diagnosis not present

## 2021-06-11 DIAGNOSIS — E1142 Type 2 diabetes mellitus with diabetic polyneuropathy: Secondary | ICD-10-CM | POA: Diagnosis not present

## 2021-06-11 DIAGNOSIS — E1165 Type 2 diabetes mellitus with hyperglycemia: Secondary | ICD-10-CM | POA: Diagnosis not present

## 2021-06-11 DIAGNOSIS — T8484XD Pain due to internal orthopedic prosthetic devices, implants and grafts, subsequent encounter: Secondary | ICD-10-CM | POA: Diagnosis not present

## 2021-06-11 DIAGNOSIS — G8929 Other chronic pain: Secondary | ICD-10-CM | POA: Diagnosis not present

## 2021-06-11 DIAGNOSIS — I959 Hypotension, unspecified: Secondary | ICD-10-CM | POA: Diagnosis not present

## 2021-06-11 DIAGNOSIS — I129 Hypertensive chronic kidney disease with stage 1 through stage 4 chronic kidney disease, or unspecified chronic kidney disease: Secondary | ICD-10-CM | POA: Diagnosis not present

## 2021-06-11 DIAGNOSIS — M069 Rheumatoid arthritis, unspecified: Secondary | ICD-10-CM | POA: Diagnosis not present

## 2021-06-11 DIAGNOSIS — E1122 Type 2 diabetes mellitus with diabetic chronic kidney disease: Secondary | ICD-10-CM | POA: Diagnosis not present

## 2021-06-15 DIAGNOSIS — E1142 Type 2 diabetes mellitus with diabetic polyneuropathy: Secondary | ICD-10-CM | POA: Diagnosis not present

## 2021-06-15 DIAGNOSIS — E1165 Type 2 diabetes mellitus with hyperglycemia: Secondary | ICD-10-CM | POA: Diagnosis not present

## 2021-06-15 DIAGNOSIS — G8929 Other chronic pain: Secondary | ICD-10-CM | POA: Diagnosis not present

## 2021-06-15 DIAGNOSIS — M069 Rheumatoid arthritis, unspecified: Secondary | ICD-10-CM | POA: Diagnosis not present

## 2021-06-15 DIAGNOSIS — I129 Hypertensive chronic kidney disease with stage 1 through stage 4 chronic kidney disease, or unspecified chronic kidney disease: Secondary | ICD-10-CM | POA: Diagnosis not present

## 2021-06-15 DIAGNOSIS — I350 Nonrheumatic aortic (valve) stenosis: Secondary | ICD-10-CM | POA: Diagnosis not present

## 2021-06-15 DIAGNOSIS — N183 Chronic kidney disease, stage 3 unspecified: Secondary | ICD-10-CM | POA: Diagnosis not present

## 2021-06-15 DIAGNOSIS — I959 Hypotension, unspecified: Secondary | ICD-10-CM | POA: Diagnosis not present

## 2021-06-15 DIAGNOSIS — T8484XD Pain due to internal orthopedic prosthetic devices, implants and grafts, subsequent encounter: Secondary | ICD-10-CM | POA: Diagnosis not present

## 2021-06-15 DIAGNOSIS — E1122 Type 2 diabetes mellitus with diabetic chronic kidney disease: Secondary | ICD-10-CM | POA: Diagnosis not present

## 2021-06-16 ENCOUNTER — Telehealth: Payer: Self-pay | Admitting: Family Medicine

## 2021-06-16 ENCOUNTER — Encounter: Payer: Self-pay | Admitting: Physician Assistant

## 2021-06-16 ENCOUNTER — Ambulatory Visit (INDEPENDENT_AMBULATORY_CARE_PROVIDER_SITE_OTHER): Payer: Medicare HMO | Admitting: Physician Assistant

## 2021-06-16 ENCOUNTER — Other Ambulatory Visit: Payer: Self-pay

## 2021-06-16 VITALS — BP 130/79 | HR 69 | Ht 70.5 in | Wt 225.0 lb

## 2021-06-16 DIAGNOSIS — E1140 Type 2 diabetes mellitus with diabetic neuropathy, unspecified: Secondary | ICD-10-CM

## 2021-06-16 DIAGNOSIS — M1712 Unilateral primary osteoarthritis, left knee: Secondary | ICD-10-CM

## 2021-06-16 DIAGNOSIS — M533 Sacrococcygeal disorders, not elsewhere classified: Secondary | ICD-10-CM

## 2021-06-16 DIAGNOSIS — M24521 Contracture, right elbow: Secondary | ICD-10-CM

## 2021-06-16 DIAGNOSIS — M160 Bilateral primary osteoarthritis of hip: Secondary | ICD-10-CM

## 2021-06-16 DIAGNOSIS — L920 Granuloma annulare: Secondary | ICD-10-CM

## 2021-06-16 DIAGNOSIS — G8929 Other chronic pain: Secondary | ICD-10-CM

## 2021-06-16 DIAGNOSIS — Z111 Encounter for screening for respiratory tuberculosis: Secondary | ICD-10-CM

## 2021-06-16 DIAGNOSIS — Z794 Long term (current) use of insulin: Secondary | ICD-10-CM

## 2021-06-16 DIAGNOSIS — M0579 Rheumatoid arthritis with rheumatoid factor of multiple sites without organ or systems involvement: Secondary | ICD-10-CM

## 2021-06-16 DIAGNOSIS — Z8673 Personal history of transient ischemic attack (TIA), and cerebral infarction without residual deficits: Secondary | ICD-10-CM

## 2021-06-16 DIAGNOSIS — E785 Hyperlipidemia, unspecified: Secondary | ICD-10-CM

## 2021-06-16 DIAGNOSIS — M5136 Other intervertebral disc degeneration, lumbar region: Secondary | ICD-10-CM | POA: Diagnosis not present

## 2021-06-16 DIAGNOSIS — R011 Cardiac murmur, unspecified: Secondary | ICD-10-CM

## 2021-06-16 DIAGNOSIS — Z79899 Other long term (current) drug therapy: Secondary | ICD-10-CM | POA: Diagnosis not present

## 2021-06-16 DIAGNOSIS — S065X9A Traumatic subdural hemorrhage with loss of consciousness of unspecified duration, initial encounter: Secondary | ICD-10-CM

## 2021-06-16 DIAGNOSIS — E1169 Type 2 diabetes mellitus with other specified complication: Secondary | ICD-10-CM

## 2021-06-16 DIAGNOSIS — I10 Essential (primary) hypertension: Secondary | ICD-10-CM | POA: Diagnosis not present

## 2021-06-16 DIAGNOSIS — M51369 Other intervertebral disc degeneration, lumbar region without mention of lumbar back pain or lower extremity pain: Secondary | ICD-10-CM

## 2021-06-16 DIAGNOSIS — S065XAA Traumatic subdural hemorrhage with loss of consciousness status unknown, initial encounter: Secondary | ICD-10-CM

## 2021-06-16 DIAGNOSIS — D049 Carcinoma in situ of skin, unspecified: Secondary | ICD-10-CM

## 2021-06-16 NOTE — Telephone Encounter (Signed)
Patient's wife declined awv

## 2021-06-16 NOTE — Patient Instructions (Signed)

## 2021-06-17 NOTE — Progress Notes (Signed)
RBC count, hgb, and hct remain low but have improved. Creatinine is elevated-1.50 and GFR is low-48.  Alk phos is slightly low-32.  Please notify the patient and advise the patient to avoid NSAIDs.  Please forward lab work to PCP as requested by the patient.

## 2021-06-18 DIAGNOSIS — I129 Hypertensive chronic kidney disease with stage 1 through stage 4 chronic kidney disease, or unspecified chronic kidney disease: Secondary | ICD-10-CM | POA: Diagnosis not present

## 2021-06-18 DIAGNOSIS — M069 Rheumatoid arthritis, unspecified: Secondary | ICD-10-CM | POA: Diagnosis not present

## 2021-06-18 DIAGNOSIS — G8929 Other chronic pain: Secondary | ICD-10-CM | POA: Diagnosis not present

## 2021-06-18 DIAGNOSIS — N183 Chronic kidney disease, stage 3 unspecified: Secondary | ICD-10-CM | POA: Diagnosis not present

## 2021-06-18 DIAGNOSIS — E1142 Type 2 diabetes mellitus with diabetic polyneuropathy: Secondary | ICD-10-CM | POA: Diagnosis not present

## 2021-06-18 DIAGNOSIS — E1165 Type 2 diabetes mellitus with hyperglycemia: Secondary | ICD-10-CM | POA: Diagnosis not present

## 2021-06-18 DIAGNOSIS — T8484XD Pain due to internal orthopedic prosthetic devices, implants and grafts, subsequent encounter: Secondary | ICD-10-CM | POA: Diagnosis not present

## 2021-06-18 DIAGNOSIS — E1122 Type 2 diabetes mellitus with diabetic chronic kidney disease: Secondary | ICD-10-CM | POA: Diagnosis not present

## 2021-06-18 DIAGNOSIS — I959 Hypotension, unspecified: Secondary | ICD-10-CM | POA: Diagnosis not present

## 2021-06-18 DIAGNOSIS — I350 Nonrheumatic aortic (valve) stenosis: Secondary | ICD-10-CM | POA: Diagnosis not present

## 2021-06-19 LAB — CBC WITH DIFFERENTIAL/PLATELET
Absolute Monocytes: 730 cells/uL (ref 200–950)
Basophils Absolute: 58 cells/uL (ref 0–200)
Basophils Relative: 0.7 %
Eosinophils Absolute: 291 cells/uL (ref 15–500)
Eosinophils Relative: 3.5 %
HCT: 33.2 % — ABNORMAL LOW (ref 38.5–50.0)
Hemoglobin: 10.5 g/dL — ABNORMAL LOW (ref 13.2–17.1)
Lymphs Abs: 2175 cells/uL (ref 850–3900)
MCH: 26 pg — ABNORMAL LOW (ref 27.0–33.0)
MCHC: 31.6 g/dL — ABNORMAL LOW (ref 32.0–36.0)
MCV: 82.2 fL (ref 80.0–100.0)
MPV: 10.8 fL (ref 7.5–12.5)
Monocytes Relative: 8.8 %
Neutro Abs: 5046 cells/uL (ref 1500–7800)
Neutrophils Relative %: 60.8 %
Platelets: 312 10*3/uL (ref 140–400)
RBC: 4.04 10*6/uL — ABNORMAL LOW (ref 4.20–5.80)
RDW: 14.7 % (ref 11.0–15.0)
Total Lymphocyte: 26.2 %
WBC: 8.3 10*3/uL (ref 3.8–10.8)

## 2021-06-19 LAB — QUANTIFERON-TB GOLD PLUS
Mitogen-NIL: 6.14 IU/mL
NIL: 0.05 IU/mL
QuantiFERON-TB Gold Plus: NEGATIVE
TB1-NIL: 0 IU/mL
TB2-NIL: 0 IU/mL

## 2021-06-19 LAB — COMPLETE METABOLIC PANEL WITH GFR
AG Ratio: 1.6 (calc) (ref 1.0–2.5)
ALT: 9 U/L (ref 9–46)
AST: 13 U/L (ref 10–35)
Albumin: 4 g/dL (ref 3.6–5.1)
Alkaline phosphatase (APISO): 32 U/L — ABNORMAL LOW (ref 35–144)
BUN/Creatinine Ratio: 17 (calc) (ref 6–22)
BUN: 26 mg/dL — ABNORMAL HIGH (ref 7–25)
CO2: 25 mmol/L (ref 20–32)
Calcium: 9.6 mg/dL (ref 8.6–10.3)
Chloride: 104 mmol/L (ref 98–110)
Creat: 1.5 mg/dL — ABNORMAL HIGH (ref 0.70–1.28)
Globulin: 2.5 g/dL (calc) (ref 1.9–3.7)
Glucose, Bld: 84 mg/dL (ref 65–99)
Potassium: 4.8 mmol/L (ref 3.5–5.3)
Sodium: 137 mmol/L (ref 135–146)
Total Bilirubin: 0.3 mg/dL (ref 0.2–1.2)
Total Protein: 6.5 g/dL (ref 6.1–8.1)
eGFR: 48 mL/min/{1.73_m2} — ABNORMAL LOW (ref >=60)

## 2021-06-22 NOTE — Progress Notes (Signed)
TB gold negative

## 2021-06-23 ENCOUNTER — Other Ambulatory Visit: Payer: Self-pay | Admitting: *Deleted

## 2021-06-23 DIAGNOSIS — L821 Other seborrheic keratosis: Secondary | ICD-10-CM | POA: Diagnosis not present

## 2021-06-23 DIAGNOSIS — D485 Neoplasm of uncertain behavior of skin: Secondary | ICD-10-CM | POA: Diagnosis not present

## 2021-06-23 DIAGNOSIS — D0422 Carcinoma in situ of skin of left ear and external auricular canal: Secondary | ICD-10-CM | POA: Diagnosis not present

## 2021-06-23 DIAGNOSIS — L570 Actinic keratosis: Secondary | ICD-10-CM | POA: Diagnosis not present

## 2021-06-23 DIAGNOSIS — Z86008 Personal history of in-situ neoplasm of other site: Secondary | ICD-10-CM | POA: Diagnosis not present

## 2021-06-23 DIAGNOSIS — D225 Melanocytic nevi of trunk: Secondary | ICD-10-CM | POA: Diagnosis not present

## 2021-06-23 DIAGNOSIS — C44329 Squamous cell carcinoma of skin of other parts of face: Secondary | ICD-10-CM | POA: Diagnosis not present

## 2021-06-23 MED ORDER — ENBREL MINI 50 MG/ML ~~LOC~~ SOCT: 50.0000 mg | SUBCUTANEOUS | 0 refills | Status: DC

## 2021-06-23 NOTE — Telephone Encounter (Signed)
Patient's wife contacted the office requesting a refill on Enbrel for patient.  Next Visit: 11/17/2021  Last Visit: 06/16/2021  Last Fill: 04/01/2021   [id number] arthritis involving multiple sites with positive rheumatoid factor   Current Dose per office note 06/16/2021: Enbrel mini 50 mg sq injections once weekly  Labs: 06/16/2021 RBC count, hgb, and hct remain low but have improved. Creatinine is elevated-1.50 and GFR is low-48.  Alk phos is slightly low-32.  TB Gold: 06/16/2021 Neg    Okay to refill Enbrel?

## 2021-06-24 ENCOUNTER — Telehealth: Payer: Self-pay

## 2021-06-24 DIAGNOSIS — M069 Rheumatoid arthritis, unspecified: Secondary | ICD-10-CM | POA: Diagnosis not present

## 2021-06-24 DIAGNOSIS — E1165 Type 2 diabetes mellitus with hyperglycemia: Secondary | ICD-10-CM | POA: Diagnosis not present

## 2021-06-24 DIAGNOSIS — N183 Chronic kidney disease, stage 3 unspecified: Secondary | ICD-10-CM | POA: Diagnosis not present

## 2021-06-24 DIAGNOSIS — G8929 Other chronic pain: Secondary | ICD-10-CM | POA: Diagnosis not present

## 2021-06-24 DIAGNOSIS — T8484XD Pain due to internal orthopedic prosthetic devices, implants and grafts, subsequent encounter: Secondary | ICD-10-CM | POA: Diagnosis not present

## 2021-06-24 DIAGNOSIS — E1142 Type 2 diabetes mellitus with diabetic polyneuropathy: Secondary | ICD-10-CM | POA: Diagnosis not present

## 2021-06-24 DIAGNOSIS — E1122 Type 2 diabetes mellitus with diabetic chronic kidney disease: Secondary | ICD-10-CM | POA: Diagnosis not present

## 2021-06-24 DIAGNOSIS — I959 Hypotension, unspecified: Secondary | ICD-10-CM | POA: Diagnosis not present

## 2021-06-24 DIAGNOSIS — I129 Hypertensive chronic kidney disease with stage 1 through stage 4 chronic kidney disease, or unspecified chronic kidney disease: Secondary | ICD-10-CM | POA: Diagnosis not present

## 2021-06-24 DIAGNOSIS — I350 Nonrheumatic aortic (valve) stenosis: Secondary | ICD-10-CM | POA: Diagnosis not present

## 2021-06-24 NOTE — Telephone Encounter (Signed)
Pt's pt nurse called in states that she needs extended PT verbal orders for Twice a week for 4 weeks. Call her back at [phone]

## 2021-06-24 NOTE — Telephone Encounter (Signed)
Called informed PT of ok per PCP to extend his orders

## 2021-07-01 DIAGNOSIS — E1142 Type 2 diabetes mellitus with diabetic polyneuropathy: Secondary | ICD-10-CM | POA: Diagnosis not present

## 2021-07-01 DIAGNOSIS — T8484XD Pain due to internal orthopedic prosthetic devices, implants and grafts, subsequent encounter: Secondary | ICD-10-CM | POA: Diagnosis not present

## 2021-07-01 DIAGNOSIS — E1122 Type 2 diabetes mellitus with diabetic chronic kidney disease: Secondary | ICD-10-CM | POA: Diagnosis not present

## 2021-07-01 DIAGNOSIS — I129 Hypertensive chronic kidney disease with stage 1 through stage 4 chronic kidney disease, or unspecified chronic kidney disease: Secondary | ICD-10-CM | POA: Diagnosis not present

## 2021-07-01 DIAGNOSIS — I350 Nonrheumatic aortic (valve) stenosis: Secondary | ICD-10-CM | POA: Diagnosis not present

## 2021-07-01 DIAGNOSIS — N183 Chronic kidney disease, stage 3 unspecified: Secondary | ICD-10-CM | POA: Diagnosis not present

## 2021-07-01 DIAGNOSIS — G8929 Other chronic pain: Secondary | ICD-10-CM | POA: Diagnosis not present

## 2021-07-01 DIAGNOSIS — I959 Hypotension, unspecified: Secondary | ICD-10-CM | POA: Diagnosis not present

## 2021-07-01 DIAGNOSIS — E1165 Type 2 diabetes mellitus with hyperglycemia: Secondary | ICD-10-CM | POA: Diagnosis not present

## 2021-07-01 DIAGNOSIS — M069 Rheumatoid arthritis, unspecified: Secondary | ICD-10-CM | POA: Diagnosis not present

## 2021-07-02 DIAGNOSIS — G8929 Other chronic pain: Secondary | ICD-10-CM | POA: Diagnosis not present

## 2021-07-02 DIAGNOSIS — E1165 Type 2 diabetes mellitus with hyperglycemia: Secondary | ICD-10-CM | POA: Diagnosis not present

## 2021-07-02 DIAGNOSIS — I350 Nonrheumatic aortic (valve) stenosis: Secondary | ICD-10-CM | POA: Diagnosis not present

## 2021-07-02 DIAGNOSIS — E1142 Type 2 diabetes mellitus with diabetic polyneuropathy: Secondary | ICD-10-CM | POA: Diagnosis not present

## 2021-07-02 DIAGNOSIS — M069 Rheumatoid arthritis, unspecified: Secondary | ICD-10-CM | POA: Diagnosis not present

## 2021-07-02 DIAGNOSIS — I129 Hypertensive chronic kidney disease with stage 1 through stage 4 chronic kidney disease, or unspecified chronic kidney disease: Secondary | ICD-10-CM | POA: Diagnosis not present

## 2021-07-02 DIAGNOSIS — N183 Chronic kidney disease, stage 3 unspecified: Secondary | ICD-10-CM | POA: Diagnosis not present

## 2021-07-02 DIAGNOSIS — T8484XD Pain due to internal orthopedic prosthetic devices, implants and grafts, subsequent encounter: Secondary | ICD-10-CM | POA: Diagnosis not present

## 2021-07-02 DIAGNOSIS — I959 Hypotension, unspecified: Secondary | ICD-10-CM | POA: Diagnosis not present

## 2021-07-02 DIAGNOSIS — E1122 Type 2 diabetes mellitus with diabetic chronic kidney disease: Secondary | ICD-10-CM | POA: Diagnosis not present

## 2021-07-04 ENCOUNTER — Other Ambulatory Visit: Payer: Self-pay | Admitting: Family Medicine

## 2021-07-06 DIAGNOSIS — G8929 Other chronic pain: Secondary | ICD-10-CM | POA: Diagnosis not present

## 2021-07-06 DIAGNOSIS — T8484XD Pain due to internal orthopedic prosthetic devices, implants and grafts, subsequent encounter: Secondary | ICD-10-CM | POA: Diagnosis not present

## 2021-07-06 DIAGNOSIS — I129 Hypertensive chronic kidney disease with stage 1 through stage 4 chronic kidney disease, or unspecified chronic kidney disease: Secondary | ICD-10-CM | POA: Diagnosis not present

## 2021-07-06 DIAGNOSIS — M069 Rheumatoid arthritis, unspecified: Secondary | ICD-10-CM | POA: Diagnosis not present

## 2021-07-06 DIAGNOSIS — E1165 Type 2 diabetes mellitus with hyperglycemia: Secondary | ICD-10-CM | POA: Diagnosis not present

## 2021-07-06 DIAGNOSIS — I350 Nonrheumatic aortic (valve) stenosis: Secondary | ICD-10-CM | POA: Diagnosis not present

## 2021-07-06 DIAGNOSIS — I959 Hypotension, unspecified: Secondary | ICD-10-CM | POA: Diagnosis not present

## 2021-07-06 DIAGNOSIS — E1142 Type 2 diabetes mellitus with diabetic polyneuropathy: Secondary | ICD-10-CM | POA: Diagnosis not present

## 2021-07-06 DIAGNOSIS — N183 Chronic kidney disease, stage 3 unspecified: Secondary | ICD-10-CM | POA: Diagnosis not present

## 2021-07-06 DIAGNOSIS — E1122 Type 2 diabetes mellitus with diabetic chronic kidney disease: Secondary | ICD-10-CM | POA: Diagnosis not present

## 2021-07-07 DIAGNOSIS — I129 Hypertensive chronic kidney disease with stage 1 through stage 4 chronic kidney disease, or unspecified chronic kidney disease: Secondary | ICD-10-CM | POA: Diagnosis not present

## 2021-07-07 DIAGNOSIS — E1122 Type 2 diabetes mellitus with diabetic chronic kidney disease: Secondary | ICD-10-CM | POA: Diagnosis not present

## 2021-07-07 DIAGNOSIS — I959 Hypotension, unspecified: Secondary | ICD-10-CM | POA: Diagnosis not present

## 2021-07-07 DIAGNOSIS — G8929 Other chronic pain: Secondary | ICD-10-CM | POA: Diagnosis not present

## 2021-07-07 DIAGNOSIS — E1142 Type 2 diabetes mellitus with diabetic polyneuropathy: Secondary | ICD-10-CM | POA: Diagnosis not present

## 2021-07-07 DIAGNOSIS — M069 Rheumatoid arthritis, unspecified: Secondary | ICD-10-CM | POA: Diagnosis not present

## 2021-07-07 DIAGNOSIS — N183 Chronic kidney disease, stage 3 unspecified: Secondary | ICD-10-CM | POA: Diagnosis not present

## 2021-07-07 DIAGNOSIS — I350 Nonrheumatic aortic (valve) stenosis: Secondary | ICD-10-CM | POA: Diagnosis not present

## 2021-07-07 DIAGNOSIS — E1165 Type 2 diabetes mellitus with hyperglycemia: Secondary | ICD-10-CM | POA: Diagnosis not present

## 2021-07-07 DIAGNOSIS — T8484XD Pain due to internal orthopedic prosthetic devices, implants and grafts, subsequent encounter: Secondary | ICD-10-CM | POA: Diagnosis not present

## 2021-07-08 DIAGNOSIS — M4320 Fusion of spine, site unspecified: Secondary | ICD-10-CM | POA: Diagnosis not present

## 2021-07-08 DIAGNOSIS — C44311 Basal cell carcinoma of skin of nose: Secondary | ICD-10-CM | POA: Diagnosis not present

## 2021-07-11 ENCOUNTER — Other Ambulatory Visit: Payer: Self-pay | Admitting: Family Medicine

## 2021-07-13 DIAGNOSIS — Z79891 Long term (current) use of opiate analgesic: Secondary | ICD-10-CM | POA: Diagnosis not present

## 2021-07-13 DIAGNOSIS — Z79899 Other long term (current) drug therapy: Secondary | ICD-10-CM | POA: Diagnosis not present

## 2021-07-13 DIAGNOSIS — M4325 Fusion of spine, thoracolumbar region: Secondary | ICD-10-CM | POA: Diagnosis not present

## 2021-07-13 DIAGNOSIS — Z6835 Body mass index (BMI) 35.0-35.9, adult: Secondary | ICD-10-CM | POA: Diagnosis not present

## 2021-07-13 DIAGNOSIS — G894 Chronic pain syndrome: Secondary | ICD-10-CM | POA: Diagnosis not present

## 2021-07-13 DIAGNOSIS — M21372 Foot drop, left foot: Secondary | ICD-10-CM | POA: Diagnosis not present

## 2021-07-16 DIAGNOSIS — E1165 Type 2 diabetes mellitus with hyperglycemia: Secondary | ICD-10-CM | POA: Diagnosis not present

## 2021-07-16 DIAGNOSIS — G8929 Other chronic pain: Secondary | ICD-10-CM | POA: Diagnosis not present

## 2021-07-16 DIAGNOSIS — I959 Hypotension, unspecified: Secondary | ICD-10-CM | POA: Diagnosis not present

## 2021-07-16 DIAGNOSIS — E1142 Type 2 diabetes mellitus with diabetic polyneuropathy: Secondary | ICD-10-CM | POA: Diagnosis not present

## 2021-07-16 DIAGNOSIS — I350 Nonrheumatic aortic (valve) stenosis: Secondary | ICD-10-CM | POA: Diagnosis not present

## 2021-07-16 DIAGNOSIS — N183 Chronic kidney disease, stage 3 unspecified: Secondary | ICD-10-CM | POA: Diagnosis not present

## 2021-07-16 DIAGNOSIS — I129 Hypertensive chronic kidney disease with stage 1 through stage 4 chronic kidney disease, or unspecified chronic kidney disease: Secondary | ICD-10-CM | POA: Diagnosis not present

## 2021-07-16 DIAGNOSIS — M069 Rheumatoid arthritis, unspecified: Secondary | ICD-10-CM | POA: Diagnosis not present

## 2021-07-16 DIAGNOSIS — E1122 Type 2 diabetes mellitus with diabetic chronic kidney disease: Secondary | ICD-10-CM | POA: Diagnosis not present

## 2021-07-16 DIAGNOSIS — T8484XD Pain due to internal orthopedic prosthetic devices, implants and grafts, subsequent encounter: Secondary | ICD-10-CM | POA: Diagnosis not present

## 2021-07-18 ENCOUNTER — Other Ambulatory Visit: Payer: Self-pay | Admitting: Family Medicine

## 2021-07-20 DIAGNOSIS — T8484XD Pain due to internal orthopedic prosthetic devices, implants and grafts, subsequent encounter: Secondary | ICD-10-CM | POA: Diagnosis not present

## 2021-07-20 DIAGNOSIS — I959 Hypotension, unspecified: Secondary | ICD-10-CM | POA: Diagnosis not present

## 2021-07-20 DIAGNOSIS — I350 Nonrheumatic aortic (valve) stenosis: Secondary | ICD-10-CM | POA: Diagnosis not present

## 2021-07-20 DIAGNOSIS — E1165 Type 2 diabetes mellitus with hyperglycemia: Secondary | ICD-10-CM | POA: Diagnosis not present

## 2021-07-20 DIAGNOSIS — E1122 Type 2 diabetes mellitus with diabetic chronic kidney disease: Secondary | ICD-10-CM | POA: Diagnosis not present

## 2021-07-20 DIAGNOSIS — M069 Rheumatoid arthritis, unspecified: Secondary | ICD-10-CM | POA: Diagnosis not present

## 2021-07-20 DIAGNOSIS — G8929 Other chronic pain: Secondary | ICD-10-CM | POA: Diagnosis not present

## 2021-07-20 DIAGNOSIS — N183 Chronic kidney disease, stage 3 unspecified: Secondary | ICD-10-CM | POA: Diagnosis not present

## 2021-07-20 DIAGNOSIS — E1142 Type 2 diabetes mellitus with diabetic polyneuropathy: Secondary | ICD-10-CM | POA: Diagnosis not present

## 2021-07-20 DIAGNOSIS — I129 Hypertensive chronic kidney disease with stage 1 through stage 4 chronic kidney disease, or unspecified chronic kidney disease: Secondary | ICD-10-CM | POA: Diagnosis not present

## 2021-07-21 ENCOUNTER — Telehealth: Payer: Self-pay

## 2021-07-21 NOTE — Telephone Encounter (Signed)
Caller/Agency: Zelphia Cairo w/[facility] Callback Number: [phone] Requesting OT/PT/Skilled Nursing/Social Work/Speech Therapy: Requesting VO to continue [facility] PT Frequency: -------

## 2021-07-21 NOTE — Telephone Encounter (Signed)
VO given.

## 2021-07-21 NOTE — Telephone Encounter (Signed)
Okay to give vo?

## 2021-07-21 NOTE — Telephone Encounter (Signed)
OK. Ty.  

## 2021-07-23 DIAGNOSIS — N183 Chronic kidney disease, stage 3 unspecified: Secondary | ICD-10-CM | POA: Diagnosis not present

## 2021-07-23 DIAGNOSIS — I350 Nonrheumatic aortic (valve) stenosis: Secondary | ICD-10-CM | POA: Diagnosis not present

## 2021-07-23 DIAGNOSIS — I959 Hypotension, unspecified: Secondary | ICD-10-CM | POA: Diagnosis not present

## 2021-07-23 DIAGNOSIS — M069 Rheumatoid arthritis, unspecified: Secondary | ICD-10-CM | POA: Diagnosis not present

## 2021-07-23 DIAGNOSIS — T8484XD Pain due to internal orthopedic prosthetic devices, implants and grafts, subsequent encounter: Secondary | ICD-10-CM | POA: Diagnosis not present

## 2021-07-23 DIAGNOSIS — I129 Hypertensive chronic kidney disease with stage 1 through stage 4 chronic kidney disease, or unspecified chronic kidney disease: Secondary | ICD-10-CM | POA: Diagnosis not present

## 2021-07-23 DIAGNOSIS — G8929 Other chronic pain: Secondary | ICD-10-CM | POA: Diagnosis not present

## 2021-07-23 DIAGNOSIS — E1122 Type 2 diabetes mellitus with diabetic chronic kidney disease: Secondary | ICD-10-CM | POA: Diagnosis not present

## 2021-07-23 DIAGNOSIS — E1165 Type 2 diabetes mellitus with hyperglycemia: Secondary | ICD-10-CM | POA: Diagnosis not present

## 2021-07-23 DIAGNOSIS — E1142 Type 2 diabetes mellitus with diabetic polyneuropathy: Secondary | ICD-10-CM | POA: Diagnosis not present

## 2021-07-28 DIAGNOSIS — E1142 Type 2 diabetes mellitus with diabetic polyneuropathy: Secondary | ICD-10-CM | POA: Diagnosis not present

## 2021-07-28 DIAGNOSIS — T8484XD Pain due to internal orthopedic prosthetic devices, implants and grafts, subsequent encounter: Secondary | ICD-10-CM | POA: Diagnosis not present

## 2021-07-28 DIAGNOSIS — G8929 Other chronic pain: Secondary | ICD-10-CM | POA: Diagnosis not present

## 2021-07-28 DIAGNOSIS — I959 Hypotension, unspecified: Secondary | ICD-10-CM | POA: Diagnosis not present

## 2021-07-28 DIAGNOSIS — M5136 Other intervertebral disc degeneration, lumbar region: Secondary | ICD-10-CM | POA: Diagnosis not present

## 2021-07-28 DIAGNOSIS — I129 Hypertensive chronic kidney disease with stage 1 through stage 4 chronic kidney disease, or unspecified chronic kidney disease: Secondary | ICD-10-CM | POA: Diagnosis not present

## 2021-07-28 DIAGNOSIS — E1122 Type 2 diabetes mellitus with diabetic chronic kidney disease: Secondary | ICD-10-CM | POA: Diagnosis not present

## 2021-07-28 DIAGNOSIS — M069 Rheumatoid arthritis, unspecified: Secondary | ICD-10-CM | POA: Diagnosis not present

## 2021-07-28 DIAGNOSIS — E1165 Type 2 diabetes mellitus with hyperglycemia: Secondary | ICD-10-CM | POA: Diagnosis not present

## 2021-07-28 DIAGNOSIS — N183 Chronic kidney disease, stage 3 unspecified: Secondary | ICD-10-CM | POA: Diagnosis not present

## 2021-07-30 DIAGNOSIS — M5136 Other intervertebral disc degeneration, lumbar region: Secondary | ICD-10-CM | POA: Diagnosis not present

## 2021-07-30 DIAGNOSIS — I959 Hypotension, unspecified: Secondary | ICD-10-CM | POA: Diagnosis not present

## 2021-07-30 DIAGNOSIS — E1165 Type 2 diabetes mellitus with hyperglycemia: Secondary | ICD-10-CM | POA: Diagnosis not present

## 2021-07-30 DIAGNOSIS — M069 Rheumatoid arthritis, unspecified: Secondary | ICD-10-CM | POA: Diagnosis not present

## 2021-07-30 DIAGNOSIS — N183 Chronic kidney disease, stage 3 unspecified: Secondary | ICD-10-CM | POA: Diagnosis not present

## 2021-07-30 DIAGNOSIS — E1122 Type 2 diabetes mellitus with diabetic chronic kidney disease: Secondary | ICD-10-CM | POA: Diagnosis not present

## 2021-07-30 DIAGNOSIS — E1142 Type 2 diabetes mellitus with diabetic polyneuropathy: Secondary | ICD-10-CM | POA: Diagnosis not present

## 2021-07-30 DIAGNOSIS — T8484XD Pain due to internal orthopedic prosthetic devices, implants and grafts, subsequent encounter: Secondary | ICD-10-CM | POA: Diagnosis not present

## 2021-07-30 DIAGNOSIS — G8929 Other chronic pain: Secondary | ICD-10-CM | POA: Diagnosis not present

## 2021-07-30 DIAGNOSIS — I129 Hypertensive chronic kidney disease with stage 1 through stage 4 chronic kidney disease, or unspecified chronic kidney disease: Secondary | ICD-10-CM | POA: Diagnosis not present

## 2021-07-31 DIAGNOSIS — R140 Abdominal distension (gaseous): Secondary | ICD-10-CM | POA: Diagnosis not present

## 2021-07-31 DIAGNOSIS — Z86010 Personal history of colonic polyps: Secondary | ICD-10-CM | POA: Diagnosis not present

## 2021-08-03 DIAGNOSIS — G8929 Other chronic pain: Secondary | ICD-10-CM | POA: Diagnosis not present

## 2021-08-03 DIAGNOSIS — I959 Hypotension, unspecified: Secondary | ICD-10-CM | POA: Diagnosis not present

## 2021-08-03 DIAGNOSIS — N183 Chronic kidney disease, stage 3 unspecified: Secondary | ICD-10-CM | POA: Diagnosis not present

## 2021-08-03 DIAGNOSIS — E1122 Type 2 diabetes mellitus with diabetic chronic kidney disease: Secondary | ICD-10-CM | POA: Diagnosis not present

## 2021-08-03 DIAGNOSIS — M5136 Other intervertebral disc degeneration, lumbar region: Secondary | ICD-10-CM | POA: Diagnosis not present

## 2021-08-03 DIAGNOSIS — M069 Rheumatoid arthritis, unspecified: Secondary | ICD-10-CM | POA: Diagnosis not present

## 2021-08-03 DIAGNOSIS — I129 Hypertensive chronic kidney disease with stage 1 through stage 4 chronic kidney disease, or unspecified chronic kidney disease: Secondary | ICD-10-CM | POA: Diagnosis not present

## 2021-08-03 DIAGNOSIS — E1142 Type 2 diabetes mellitus with diabetic polyneuropathy: Secondary | ICD-10-CM | POA: Diagnosis not present

## 2021-08-03 DIAGNOSIS — T8484XD Pain due to internal orthopedic prosthetic devices, implants and grafts, subsequent encounter: Secondary | ICD-10-CM | POA: Diagnosis not present

## 2021-08-03 DIAGNOSIS — E1165 Type 2 diabetes mellitus with hyperglycemia: Secondary | ICD-10-CM | POA: Diagnosis not present

## 2021-08-04 ENCOUNTER — Ambulatory Visit: Payer: Medicare HMO

## 2021-08-05 DIAGNOSIS — T8484XD Pain due to internal orthopedic prosthetic devices, implants and grafts, subsequent encounter: Secondary | ICD-10-CM | POA: Diagnosis not present

## 2021-08-05 DIAGNOSIS — E1122 Type 2 diabetes mellitus with diabetic chronic kidney disease: Secondary | ICD-10-CM | POA: Diagnosis not present

## 2021-08-05 DIAGNOSIS — G8929 Other chronic pain: Secondary | ICD-10-CM | POA: Diagnosis not present

## 2021-08-05 DIAGNOSIS — E1142 Type 2 diabetes mellitus with diabetic polyneuropathy: Secondary | ICD-10-CM | POA: Diagnosis not present

## 2021-08-05 DIAGNOSIS — E1165 Type 2 diabetes mellitus with hyperglycemia: Secondary | ICD-10-CM | POA: Diagnosis not present

## 2021-08-05 DIAGNOSIS — I129 Hypertensive chronic kidney disease with stage 1 through stage 4 chronic kidney disease, or unspecified chronic kidney disease: Secondary | ICD-10-CM | POA: Diagnosis not present

## 2021-08-05 DIAGNOSIS — I959 Hypotension, unspecified: Secondary | ICD-10-CM | POA: Diagnosis not present

## 2021-08-05 DIAGNOSIS — M069 Rheumatoid arthritis, unspecified: Secondary | ICD-10-CM | POA: Diagnosis not present

## 2021-08-05 DIAGNOSIS — N183 Chronic kidney disease, stage 3 unspecified: Secondary | ICD-10-CM | POA: Diagnosis not present

## 2021-08-05 DIAGNOSIS — M5136 Other intervertebral disc degeneration, lumbar region: Secondary | ICD-10-CM | POA: Diagnosis not present

## 2021-08-07 DIAGNOSIS — M4320 Fusion of spine, site unspecified: Secondary | ICD-10-CM | POA: Diagnosis not present

## 2021-08-10 DIAGNOSIS — M4326 Fusion of spine, lumbar region: Secondary | ICD-10-CM | POA: Diagnosis not present

## 2021-08-10 DIAGNOSIS — M4325 Fusion of spine, thoracolumbar region: Secondary | ICD-10-CM | POA: Diagnosis not present

## 2021-08-10 DIAGNOSIS — I10 Essential (primary) hypertension: Secondary | ICD-10-CM | POA: Diagnosis not present

## 2021-08-10 DIAGNOSIS — Z6835 Body mass index (BMI) 35.0-35.9, adult: Secondary | ICD-10-CM | POA: Diagnosis not present

## 2021-08-10 DIAGNOSIS — M21372 Foot drop, left foot: Secondary | ICD-10-CM | POA: Diagnosis not present

## 2021-08-11 DIAGNOSIS — E1142 Type 2 diabetes mellitus with diabetic polyneuropathy: Secondary | ICD-10-CM | POA: Diagnosis not present

## 2021-08-11 DIAGNOSIS — E1122 Type 2 diabetes mellitus with diabetic chronic kidney disease: Secondary | ICD-10-CM | POA: Diagnosis not present

## 2021-08-11 DIAGNOSIS — I129 Hypertensive chronic kidney disease with stage 1 through stage 4 chronic kidney disease, or unspecified chronic kidney disease: Secondary | ICD-10-CM | POA: Diagnosis not present

## 2021-08-11 DIAGNOSIS — M5136 Other intervertebral disc degeneration, lumbar region: Secondary | ICD-10-CM | POA: Diagnosis not present

## 2021-08-11 DIAGNOSIS — G8929 Other chronic pain: Secondary | ICD-10-CM | POA: Diagnosis not present

## 2021-08-11 DIAGNOSIS — M069 Rheumatoid arthritis, unspecified: Secondary | ICD-10-CM | POA: Diagnosis not present

## 2021-08-11 DIAGNOSIS — N183 Chronic kidney disease, stage 3 unspecified: Secondary | ICD-10-CM | POA: Diagnosis not present

## 2021-08-11 DIAGNOSIS — E1165 Type 2 diabetes mellitus with hyperglycemia: Secondary | ICD-10-CM | POA: Diagnosis not present

## 2021-08-11 DIAGNOSIS — T8484XD Pain due to internal orthopedic prosthetic devices, implants and grafts, subsequent encounter: Secondary | ICD-10-CM | POA: Diagnosis not present

## 2021-08-11 DIAGNOSIS — I959 Hypotension, unspecified: Secondary | ICD-10-CM | POA: Diagnosis not present

## 2021-08-13 DIAGNOSIS — N183 Chronic kidney disease, stage 3 unspecified: Secondary | ICD-10-CM | POA: Diagnosis not present

## 2021-08-13 DIAGNOSIS — I959 Hypotension, unspecified: Secondary | ICD-10-CM | POA: Diagnosis not present

## 2021-08-13 DIAGNOSIS — I129 Hypertensive chronic kidney disease with stage 1 through stage 4 chronic kidney disease, or unspecified chronic kidney disease: Secondary | ICD-10-CM | POA: Diagnosis not present

## 2021-08-13 DIAGNOSIS — G8929 Other chronic pain: Secondary | ICD-10-CM | POA: Diagnosis not present

## 2021-08-13 DIAGNOSIS — T8484XD Pain due to internal orthopedic prosthetic devices, implants and grafts, subsequent encounter: Secondary | ICD-10-CM | POA: Diagnosis not present

## 2021-08-13 DIAGNOSIS — M5136 Other intervertebral disc degeneration, lumbar region: Secondary | ICD-10-CM | POA: Diagnosis not present

## 2021-08-13 DIAGNOSIS — M069 Rheumatoid arthritis, unspecified: Secondary | ICD-10-CM | POA: Diagnosis not present

## 2021-08-13 DIAGNOSIS — E1122 Type 2 diabetes mellitus with diabetic chronic kidney disease: Secondary | ICD-10-CM | POA: Diagnosis not present

## 2021-08-13 DIAGNOSIS — E1142 Type 2 diabetes mellitus with diabetic polyneuropathy: Secondary | ICD-10-CM | POA: Diagnosis not present

## 2021-08-13 DIAGNOSIS — E1165 Type 2 diabetes mellitus with hyperglycemia: Secondary | ICD-10-CM | POA: Diagnosis not present

## 2021-08-14 ENCOUNTER — Other Ambulatory Visit: Payer: Self-pay | Admitting: Physician Assistant

## 2021-08-14 DIAGNOSIS — M0579 Rheumatoid arthritis with rheumatoid factor of multiple sites without organ or systems involvement: Secondary | ICD-10-CM

## 2021-08-14 NOTE — Telephone Encounter (Signed)
Next Visit: 11/17/2021  Last Visit: 06/16/2021  Last Fill: 05/25/2021  DX: Rheumatoid arthritis involving multiple sites with positive rheumatoid factor   Current Dose per office note 06/16/2021: Arava 10 mg 1 tablet by mouth daily   Labs: 06/16/2021 RBC count, hgb, and hct remain low but have improved. Creatinine is elevated-1.50 and GFR is low-48.  Alk phos is slightly low-32  Okay to refill Arava?

## 2021-08-17 DIAGNOSIS — E1122 Type 2 diabetes mellitus with diabetic chronic kidney disease: Secondary | ICD-10-CM | POA: Diagnosis not present

## 2021-08-17 DIAGNOSIS — G8929 Other chronic pain: Secondary | ICD-10-CM | POA: Diagnosis not present

## 2021-08-17 DIAGNOSIS — N183 Chronic kidney disease, stage 3 unspecified: Secondary | ICD-10-CM | POA: Diagnosis not present

## 2021-08-17 DIAGNOSIS — I959 Hypotension, unspecified: Secondary | ICD-10-CM | POA: Diagnosis not present

## 2021-08-17 DIAGNOSIS — E1165 Type 2 diabetes mellitus with hyperglycemia: Secondary | ICD-10-CM | POA: Diagnosis not present

## 2021-08-17 DIAGNOSIS — M069 Rheumatoid arthritis, unspecified: Secondary | ICD-10-CM | POA: Diagnosis not present

## 2021-08-17 DIAGNOSIS — M5136 Other intervertebral disc degeneration, lumbar region: Secondary | ICD-10-CM | POA: Diagnosis not present

## 2021-08-17 DIAGNOSIS — T8484XD Pain due to internal orthopedic prosthetic devices, implants and grafts, subsequent encounter: Secondary | ICD-10-CM | POA: Diagnosis not present

## 2021-08-17 DIAGNOSIS — E1142 Type 2 diabetes mellitus with diabetic polyneuropathy: Secondary | ICD-10-CM | POA: Diagnosis not present

## 2021-08-17 DIAGNOSIS — I129 Hypertensive chronic kidney disease with stage 1 through stage 4 chronic kidney disease, or unspecified chronic kidney disease: Secondary | ICD-10-CM | POA: Diagnosis not present

## 2021-08-18 DIAGNOSIS — T8484XD Pain due to internal orthopedic prosthetic devices, implants and grafts, subsequent encounter: Secondary | ICD-10-CM | POA: Diagnosis not present

## 2021-08-18 DIAGNOSIS — E1165 Type 2 diabetes mellitus with hyperglycemia: Secondary | ICD-10-CM | POA: Diagnosis not present

## 2021-08-18 DIAGNOSIS — I959 Hypotension, unspecified: Secondary | ICD-10-CM | POA: Diagnosis not present

## 2021-08-18 DIAGNOSIS — I129 Hypertensive chronic kidney disease with stage 1 through stage 4 chronic kidney disease, or unspecified chronic kidney disease: Secondary | ICD-10-CM | POA: Diagnosis not present

## 2021-08-18 DIAGNOSIS — E1122 Type 2 diabetes mellitus with diabetic chronic kidney disease: Secondary | ICD-10-CM | POA: Diagnosis not present

## 2021-08-18 DIAGNOSIS — E1142 Type 2 diabetes mellitus with diabetic polyneuropathy: Secondary | ICD-10-CM | POA: Diagnosis not present

## 2021-08-18 DIAGNOSIS — M5136 Other intervertebral disc degeneration, lumbar region: Secondary | ICD-10-CM | POA: Diagnosis not present

## 2021-08-18 DIAGNOSIS — G8929 Other chronic pain: Secondary | ICD-10-CM | POA: Diagnosis not present

## 2021-08-18 DIAGNOSIS — M069 Rheumatoid arthritis, unspecified: Secondary | ICD-10-CM | POA: Diagnosis not present

## 2021-08-18 DIAGNOSIS — N183 Chronic kidney disease, stage 3 unspecified: Secondary | ICD-10-CM | POA: Diagnosis not present

## 2021-08-25 ENCOUNTER — Telehealth: Payer: Self-pay | Admitting: Family Medicine

## 2021-08-25 DIAGNOSIS — I129 Hypertensive chronic kidney disease with stage 1 through stage 4 chronic kidney disease, or unspecified chronic kidney disease: Secondary | ICD-10-CM | POA: Diagnosis not present

## 2021-08-25 DIAGNOSIS — M5136 Other intervertebral disc degeneration, lumbar region: Secondary | ICD-10-CM | POA: Diagnosis not present

## 2021-08-25 DIAGNOSIS — T8484XD Pain due to internal orthopedic prosthetic devices, implants and grafts, subsequent encounter: Secondary | ICD-10-CM | POA: Diagnosis not present

## 2021-08-25 DIAGNOSIS — E1165 Type 2 diabetes mellitus with hyperglycemia: Secondary | ICD-10-CM | POA: Diagnosis not present

## 2021-08-25 DIAGNOSIS — G8929 Other chronic pain: Secondary | ICD-10-CM | POA: Diagnosis not present

## 2021-08-25 DIAGNOSIS — M069 Rheumatoid arthritis, unspecified: Secondary | ICD-10-CM | POA: Diagnosis not present

## 2021-08-25 DIAGNOSIS — E1142 Type 2 diabetes mellitus with diabetic polyneuropathy: Secondary | ICD-10-CM | POA: Diagnosis not present

## 2021-08-25 DIAGNOSIS — I959 Hypotension, unspecified: Secondary | ICD-10-CM | POA: Diagnosis not present

## 2021-08-25 DIAGNOSIS — N183 Chronic kidney disease, stage 3 unspecified: Secondary | ICD-10-CM | POA: Diagnosis not present

## 2021-08-25 DIAGNOSIS — E1122 Type 2 diabetes mellitus with diabetic chronic kidney disease: Secondary | ICD-10-CM | POA: Diagnosis not present

## 2021-08-25 NOTE — Telephone Encounter (Signed)
Called HH and left detailed message of PCP ok 

## 2021-08-25 NOTE — Telephone Encounter (Signed)
Caller/Agency: [place] Number: [phone] Requesting OT/PT/Skilled Nursing/Social Work/Speech Therapy: PT Frequency:  2x week for 4weeks 1x week for 1week

## 2021-08-27 ENCOUNTER — Telehealth: Payer: Self-pay | Admitting: Family Medicine

## 2021-08-27 NOTE — Telephone Encounter (Signed)
Appt scheduled for monday

## 2021-08-27 NOTE — Telephone Encounter (Signed)
Pt stated his left foot is starting to swell and would like to know if fluid or any type of medication can be prescribed to elevate swelling. Please advise.

## 2021-08-28 ENCOUNTER — Other Ambulatory Visit: Payer: Self-pay | Admitting: Family Medicine

## 2021-08-28 DIAGNOSIS — E1122 Type 2 diabetes mellitus with diabetic chronic kidney disease: Secondary | ICD-10-CM | POA: Diagnosis not present

## 2021-08-28 DIAGNOSIS — N183 Chronic kidney disease, stage 3 unspecified: Secondary | ICD-10-CM | POA: Diagnosis not present

## 2021-08-28 DIAGNOSIS — I129 Hypertensive chronic kidney disease with stage 1 through stage 4 chronic kidney disease, or unspecified chronic kidney disease: Secondary | ICD-10-CM | POA: Diagnosis not present

## 2021-08-28 DIAGNOSIS — M069 Rheumatoid arthritis, unspecified: Secondary | ICD-10-CM | POA: Diagnosis not present

## 2021-08-28 DIAGNOSIS — M5136 Other intervertebral disc degeneration, lumbar region: Secondary | ICD-10-CM | POA: Diagnosis not present

## 2021-08-28 DIAGNOSIS — E1165 Type 2 diabetes mellitus with hyperglycemia: Secondary | ICD-10-CM | POA: Diagnosis not present

## 2021-08-28 DIAGNOSIS — G8929 Other chronic pain: Secondary | ICD-10-CM | POA: Diagnosis not present

## 2021-08-28 DIAGNOSIS — I959 Hypotension, unspecified: Secondary | ICD-10-CM | POA: Diagnosis not present

## 2021-08-28 DIAGNOSIS — T8484XD Pain due to internal orthopedic prosthetic devices, implants and grafts, subsequent encounter: Secondary | ICD-10-CM | POA: Diagnosis not present

## 2021-08-28 DIAGNOSIS — E1142 Type 2 diabetes mellitus with diabetic polyneuropathy: Secondary | ICD-10-CM | POA: Diagnosis not present

## 2021-08-31 ENCOUNTER — Other Ambulatory Visit: Payer: Self-pay

## 2021-08-31 ENCOUNTER — Encounter: Payer: Self-pay | Admitting: Family Medicine

## 2021-08-31 ENCOUNTER — Ambulatory Visit (INDEPENDENT_AMBULATORY_CARE_PROVIDER_SITE_OTHER): Payer: Medicare HMO | Admitting: Family Medicine

## 2021-08-31 VITALS — BP 138/78 | HR 71 | Temp 98.5°F | Ht 70.5 in | Wt 231.5 lb

## 2021-08-31 DIAGNOSIS — R600 Localized edema: Secondary | ICD-10-CM | POA: Diagnosis not present

## 2021-08-31 DIAGNOSIS — Z23 Encounter for immunization: Secondary | ICD-10-CM

## 2021-08-31 DIAGNOSIS — M79672 Pain in left foot: Secondary | ICD-10-CM | POA: Diagnosis not present

## 2021-08-31 DIAGNOSIS — E781 Pure hyperglyceridemia: Secondary | ICD-10-CM | POA: Diagnosis not present

## 2021-08-31 NOTE — Patient Instructions (Addendum)
Give [place] 2-3 business days to get the results of your labs back.   Keep the diet clean and stay active.  If you do not hear anything about your referral in the next 1-2 weeks, call our office and ask for an update.  For the swelling in your lower extremities, be sure to elevate your legs when able, mind the salt intake, stay physically active and consider wearing compression stockings.  I recommend getting the updated bivalent covid vaccination booster at your convenience.   Let us know if you need anything.

## 2021-08-31 NOTE — Progress Notes (Signed)
Chief Complaint  Patient presents with   Edema    Left foot Back pain     Tommy Green here for bilateral leg swelling.  Duration: 1  week Hx of prolonged bedrest, recent surgery, travel or injury? No Pain the calf? No SOB? No Personal or family history of clot or bleeding disorder? No Hx of heart failure, renal failure, hepatic failure? No He has associated pain in his left foot, no known injury or change in activity.  Past Medical History:  Diagnosis Date   Anemia    Essential hypertension 07/18/2017   Foot drop, left foot    due to back surgery in 01/2021   History of colonic polyps    History of rheumatic fever    History of stroke 07/18/2017   Hypertriglyceridemia 07/18/2017   Type 2 diabetes mellitus with diabetic neuropathy, unspecified ([place]) 07/18/2017   Family History  Problem Relation Age of Onset   Hyperlipidemia Mother    Colon polyps Mother    Hyperlipidemia Father    Alcoholism Brother    Healthy Son    Healthy Son    Cancer Neg Hx    Colon cancer Neg Hx    Esophageal cancer Neg Hx    Rectal cancer Neg Hx    Stomach cancer Neg Hx    Past Surgical History:  Procedure Laterality Date   COLONOSCOPY     around 2018 [facility]   COLONOSCOPY  09/02/2020   CRANIOTOMY Left 10/25/2019   Procedure: CRANIOTOMY HEMATOMA EVACUATION SUBDURAL;  Surgeon: Vallarie Mare, MD;  Location: [place];  Service: Neurosurgery;  Laterality: Left;   ESOPHAGOGASTRODUODENOSCOPY     around 2018 with [facility]   FOOT CAPSULOTOMY Left 07/25/2008   Mid Foot #2 MPJ   Hammertoe Repair Left 07/25/2008   #2 toe   SPINAL FUSION  01/2021   TARSAL TUNNEL RELEASE Left 07/25/2008   UPPER GASTROINTESTINAL ENDOSCOPY  09/02/2020    Current Outpatient Medications:    acetaminophen (TYLENOL) 325 MG tablet, Take 650 mg by mouth every 6 (six) hours as needed (for pain)., Disp: , Rfl:    amLODipine (NORVASC) 5 MG tablet, TAKE 1 TABLET DAILY, Disp: 90 tablet, Rfl: 0   B  Complex Vitamins (B COMPLEX PO), Take 1 tablet by mouth daily., Disp: , Rfl:    carvedilol (COREG) 12.5 MG tablet, TAKE 1 TABLET TWICE DAILY  WITH MEALS, Disp: 180 tablet, Rfl: 1   Cholecalciferol (VITAMIN D3 SUPER STRENGTH) 50 MCG (2000 UT) CAPS, Take 2,000 Units by mouth daily., Disp: , Rfl:    Etanercept (ENBREL MINI) 50 MG/ML SOCT, Inject 50 mg into the skin once a week., Disp: 12 mL, Rfl: 0   ezetimibe (ZETIA) 10 MG tablet, Take 1 tablet (10 mg total) by mouth daily., Disp: 30 tablet, Rfl: 3   fenofibrate micronized (LOFIBRA) 200 MG capsule, TAKE 1 CAPSULE DAILY BEFOREBREAKFAST, Disp: 90 capsule, Rfl: 3   gabapentin (NEURONTIN) 100 MG capsule, TAKE 2 CAPSULES AT BEDTIME, Disp: 180 capsule, Rfl: 0   insulin degludec (TRESIBA) 100 UNIT/ML FlexTouch Pen, Inject 30 Units into the skin at bedtime., Disp: , Rfl:    insulin lispro (HUMALOG) 100 UNIT/ML injection, Inject 0-10 Units into the skin See admin instructions. Inject 0-10 units into the skin before meals, PER SLIDING SCALE: BGL 0-200 = give nothing, 201-250 = 2 units, 251-300 = 4 units, 301-350 = 6 units, 351-400 = 8 units, >400 = 10 units, push fluids, and repeat BGL check  in 2 hours. If BGL remains >400 after 2 hours, CALL MD., Disp: , Rfl:    leflunomide (ARAVA) 10 MG tablet, TAKE 1 TABLET BY MOUTH EVERY DAY, Disp: 90 tablet, Rfl: 0   lisinopril (ZESTRIL) 20 MG tablet, Take 1 tablet (20 mg total) by mouth daily., Disp: 90 tablet, Rfl: 3   Magnesium 200 MG TABS, Take 200 mg by mouth daily in the afternoon., Disp: , Rfl:    Multiple Vitamins-Minerals (MULTIVITAMIN WITH MINERALS) tablet, Take 1 tablet by mouth daily., Disp: , Rfl:    omega-3 acid ethyl esters (LOVAZA) 1 g capsule, Take 1 capsule (1 g total) by mouth 2 (two) times daily., Disp: 60 capsule, Rfl: 3   omeprazole (PRILOSEC) 20 MG capsule, Take 1 capsule (20 mg total) by mouth daily before breakfast., Disp: 90 capsule, Rfl: 3   ONETOUCH VERIO test strip, 4 (four) times daily., Disp: ,  Rfl:    Potassium 99 MG TABS, Take 99 mg by mouth daily., Disp: , Rfl:    vitamin B-12 (CYANOCOBALAMIN) 1000 MCG tablet, Take 1,000 mcg by mouth daily., Disp: , Rfl:   BP 138/78   Pulse 71   Temp 98.5 F (36.9 C) (Oral)   Ht 5' 10.5" (1.791 m)   Wt 231 lb 8 oz (105 kg)   SpO2 99%   BMI 32.75 kg/m  Gen- awake, alert, appears stated age Heart- RRR, +murmur, 3+ LE edema on L tapering at knee, 2+ pitting LE edema on R tapering at mid tibia Lungs- CTAB, normal effort w/o accessory muscle use MSK- no calf pain; there is pain between the first and second ray on the left foot without appreciable mass; no ecchymosis, deformity, or localized edema Psych: Age appropriate judgment and insight  Left foot pain - Plan: Ambulatory referral to Sports Medicine  Bilateral lower extremity edema - Plan: Comprehensive metabolic panel, TSH  Hypertriglyceridemia - Plan: Ambulatory referral to Cardiology, CANCELED: Ambulatory referral to Cardiology  Morbid (severe) obesity due to excess calories ([place]), Chronic  Need for influenza vaccination - Plan: Flu Vaccine QUAD High Dose(Fluad)  Refer to sports med due to concern for cyst or neuroma. New problem, uncertain prognosis.  Check labs.  Prescription form for compression stockings provided.  Elevate legs, mind salt intake, stay as active as possible.  Very low likelihood of DVT. Refer to cardiology for reported heart abnormalities noticed while he was inpatient in addition to hypertriglyceridemia refractory to conventional treatment. F/u prn. Pt and his spouse voiced understanding and agreement to the plan.  Kaycee, DO 08/31/21  4:54 PM

## 2021-09-01 DIAGNOSIS — E1142 Type 2 diabetes mellitus with diabetic polyneuropathy: Secondary | ICD-10-CM | POA: Diagnosis not present

## 2021-09-01 DIAGNOSIS — E1122 Type 2 diabetes mellitus with diabetic chronic kidney disease: Secondary | ICD-10-CM | POA: Diagnosis not present

## 2021-09-01 DIAGNOSIS — E1165 Type 2 diabetes mellitus with hyperglycemia: Secondary | ICD-10-CM | POA: Diagnosis not present

## 2021-09-01 DIAGNOSIS — I959 Hypotension, unspecified: Secondary | ICD-10-CM | POA: Diagnosis not present

## 2021-09-01 DIAGNOSIS — N183 Chronic kidney disease, stage 3 unspecified: Secondary | ICD-10-CM | POA: Diagnosis not present

## 2021-09-01 DIAGNOSIS — M5136 Other intervertebral disc degeneration, lumbar region: Secondary | ICD-10-CM | POA: Diagnosis not present

## 2021-09-01 DIAGNOSIS — M069 Rheumatoid arthritis, unspecified: Secondary | ICD-10-CM | POA: Diagnosis not present

## 2021-09-01 DIAGNOSIS — T8484XD Pain due to internal orthopedic prosthetic devices, implants and grafts, subsequent encounter: Secondary | ICD-10-CM | POA: Diagnosis not present

## 2021-09-01 DIAGNOSIS — I129 Hypertensive chronic kidney disease with stage 1 through stage 4 chronic kidney disease, or unspecified chronic kidney disease: Secondary | ICD-10-CM | POA: Diagnosis not present

## 2021-09-01 DIAGNOSIS — G8929 Other chronic pain: Secondary | ICD-10-CM | POA: Diagnosis not present

## 2021-09-03 ENCOUNTER — Ambulatory Visit: Payer: Self-pay

## 2021-09-03 ENCOUNTER — Encounter: Payer: Self-pay | Admitting: Family Medicine

## 2021-09-03 ENCOUNTER — Ambulatory Visit: Payer: Medicare HMO | Admitting: Family Medicine

## 2021-09-03 VITALS — BP 110/60 | Ht 70.5 in | Wt 230.0 lb

## 2021-09-03 DIAGNOSIS — M84375A Stress fracture, left foot, initial encounter for fracture: Secondary | ICD-10-CM | POA: Diagnosis not present

## 2021-09-03 DIAGNOSIS — M79672 Pain in left foot: Secondary | ICD-10-CM

## 2021-09-03 NOTE — Patient Instructions (Signed)
Nice to meet you Please try the support  Please avoid walking barefoot   Please send me a message in MyChart with any questions or updates.  Please see me back in 2-3 weeks.   --Dr. Raeford Razor

## 2021-09-03 NOTE — Assessment & Plan Note (Signed)
Having changes to indicate a stress fracture of the third metatarsal.  Has foot drop in the foot and likely secondary to the instability of his gait. -Counseled on home exercise therapy and supportive care. -Green sport insoles with additional padding. -May need to put physical therapy on hold. -Could consider further imaging

## 2021-09-03 NOTE — Progress Notes (Signed)
Tommy Green - 75 y.o. male MRN [id number]  Date of birth: 07/27/1946  SUBJECTIVE:  Including CC & ROS.  No chief complaint on file.   Tommy Green is a 75 y.o. male that is presenting with left foot pain.  The pain is occurring over the forefoot on the dorsal aspect.  Has been ongoing for a few months now.  He has left foot drop secondary to a back surgery.  He has started ambulating more with a walker and cane.  He does get swelling in the foot.   Review of Systems See HPI   HISTORY: Past Medical, Surgical, Social, and Family History Reviewed & Updated per EMR.   Pertinent Historical Findings include:  Past Medical History:  Diagnosis Date   Anemia    Essential hypertension 07/18/2017   Foot drop, left foot    due to back surgery in 01/2021   History of colonic polyps    History of rheumatic fever    History of stroke 07/18/2017   Hypertriglyceridemia 07/18/2017   Type 2 diabetes mellitus with diabetic neuropathy, unspecified ([place]) 07/18/2017    Past Surgical History:  Procedure Laterality Date   COLONOSCOPY     around 2018 [facility]   COLONOSCOPY  09/02/2020   CRANIOTOMY Left 10/25/2019   Procedure: CRANIOTOMY HEMATOMA EVACUATION SUBDURAL;  Surgeon: Vallarie Mare, MD;  Location: [place];  Service: Neurosurgery;  Laterality: Left;   ESOPHAGOGASTRODUODENOSCOPY     around 2018 with [facility]   FOOT CAPSULOTOMY Left 07/25/2008   Mid Foot #2 MPJ   Hammertoe Repair Left 07/25/2008   #2 toe   SPINAL FUSION  01/2021   TARSAL TUNNEL RELEASE Left 07/25/2008   UPPER GASTROINTESTINAL ENDOSCOPY  09/02/2020    Family History  Problem Relation Age of Onset   Hyperlipidemia Mother    Colon polyps Mother    Hyperlipidemia Father    Alcoholism Brother    Healthy Son    Healthy Son    Cancer Neg Hx    Colon cancer Neg Hx    Esophageal cancer Neg Hx    Rectal cancer Neg Hx    Stomach cancer Neg Hx     Social History   Socioeconomic History   Marital  status: Married    Spouse name: Not on file   Number of children: Not on file   Years of education: Not on file   Highest education level: Not on file  Occupational History   Not on file  Tobacco Use   Smoking status: Former    Years: 10.00    Types: Cigarettes    Quit date: 1973    Years since quitting: 49.9   Smokeless tobacco: Never  Vaping Use   Vaping Use: Never used  Substance and Sexual Activity   Alcohol use: Yes    Comment: occ beer   Drug use: No   Sexual activity: Yes  Other Topics Concern   Not on file  Social History Narrative   Not on file   Social Determinants of Health   Financial Resource Strain: Not on file  Food Insecurity: Not on file  Transportation Needs: Not on file  Physical Activity: Not on file  Stress: Not on file  Social Connections: Not on file  Intimate Partner Violence: Not on file     PHYSICAL EXAM:  VS: BP 110/60 (BP Location: Left Arm, Patient Position: Sitting)   Ht 5' 10.5" (1.791 m)   Wt 230 lb (104.3  kg)   BMI 32.54 kg/m  Physical Exam Gen: NAD, alert, cooperative with exam, well-appearing   Limited ultrasound: Left foot:  No changes at the ankle joint. Normal-appearing first MTP joint. There is increased hyperemia of the third metatarsal to suggest a stress fracture. No changes at the base of the fifth  Summary: Stress fracture of the third metatarsal  Ultrasound and interpretation by Clearance Coots, MD     ASSESSMENT & PLAN:   Stress fracture of left foot Having changes to indicate a stress fracture of the third metatarsal.  Has foot drop in the foot and likely secondary to the instability of his gait. -Counseled on home exercise therapy and supportive care. -Green sport insoles with additional padding. -May need to put physical therapy on hold. -Could consider further imaging

## 2021-09-04 DIAGNOSIS — N183 Chronic kidney disease, stage 3 unspecified: Secondary | ICD-10-CM | POA: Diagnosis not present

## 2021-09-04 DIAGNOSIS — E1165 Type 2 diabetes mellitus with hyperglycemia: Secondary | ICD-10-CM | POA: Diagnosis not present

## 2021-09-04 DIAGNOSIS — I129 Hypertensive chronic kidney disease with stage 1 through stage 4 chronic kidney disease, or unspecified chronic kidney disease: Secondary | ICD-10-CM | POA: Diagnosis not present

## 2021-09-04 DIAGNOSIS — R140 Abdominal distension (gaseous): Secondary | ICD-10-CM | POA: Diagnosis not present

## 2021-09-04 DIAGNOSIS — E1122 Type 2 diabetes mellitus with diabetic chronic kidney disease: Secondary | ICD-10-CM | POA: Diagnosis not present

## 2021-09-04 DIAGNOSIS — E1142 Type 2 diabetes mellitus with diabetic polyneuropathy: Secondary | ICD-10-CM | POA: Diagnosis not present

## 2021-09-04 DIAGNOSIS — M069 Rheumatoid arthritis, unspecified: Secondary | ICD-10-CM | POA: Diagnosis not present

## 2021-09-04 DIAGNOSIS — I959 Hypotension, unspecified: Secondary | ICD-10-CM | POA: Diagnosis not present

## 2021-09-04 DIAGNOSIS — G8929 Other chronic pain: Secondary | ICD-10-CM | POA: Diagnosis not present

## 2021-09-04 DIAGNOSIS — M5136 Other intervertebral disc degeneration, lumbar region: Secondary | ICD-10-CM | POA: Diagnosis not present

## 2021-09-04 DIAGNOSIS — T8484XD Pain due to internal orthopedic prosthetic devices, implants and grafts, subsequent encounter: Secondary | ICD-10-CM | POA: Diagnosis not present

## 2021-09-07 DIAGNOSIS — M5136 Other intervertebral disc degeneration, lumbar region: Secondary | ICD-10-CM | POA: Diagnosis not present

## 2021-09-07 DIAGNOSIS — M069 Rheumatoid arthritis, unspecified: Secondary | ICD-10-CM | POA: Diagnosis not present

## 2021-09-07 DIAGNOSIS — E1122 Type 2 diabetes mellitus with diabetic chronic kidney disease: Secondary | ICD-10-CM | POA: Diagnosis not present

## 2021-09-07 DIAGNOSIS — G8929 Other chronic pain: Secondary | ICD-10-CM | POA: Diagnosis not present

## 2021-09-07 DIAGNOSIS — I10 Essential (primary) hypertension: Secondary | ICD-10-CM | POA: Diagnosis not present

## 2021-09-07 DIAGNOSIS — E1165 Type 2 diabetes mellitus with hyperglycemia: Secondary | ICD-10-CM | POA: Diagnosis not present

## 2021-09-07 DIAGNOSIS — E1142 Type 2 diabetes mellitus with diabetic polyneuropathy: Secondary | ICD-10-CM | POA: Diagnosis not present

## 2021-09-07 DIAGNOSIS — Z6835 Body mass index (BMI) 35.0-35.9, adult: Secondary | ICD-10-CM | POA: Diagnosis not present

## 2021-09-07 DIAGNOSIS — T8484XD Pain due to internal orthopedic prosthetic devices, implants and grafts, subsequent encounter: Secondary | ICD-10-CM | POA: Diagnosis not present

## 2021-09-07 DIAGNOSIS — M4320 Fusion of spine, site unspecified: Secondary | ICD-10-CM | POA: Diagnosis not present

## 2021-09-07 DIAGNOSIS — M21372 Foot drop, left foot: Secondary | ICD-10-CM | POA: Diagnosis not present

## 2021-09-07 DIAGNOSIS — M4325 Fusion of spine, thoracolumbar region: Secondary | ICD-10-CM | POA: Diagnosis not present

## 2021-09-07 DIAGNOSIS — I129 Hypertensive chronic kidney disease with stage 1 through stage 4 chronic kidney disease, or unspecified chronic kidney disease: Secondary | ICD-10-CM | POA: Diagnosis not present

## 2021-09-07 DIAGNOSIS — I959 Hypotension, unspecified: Secondary | ICD-10-CM | POA: Diagnosis not present

## 2021-09-07 DIAGNOSIS — N183 Chronic kidney disease, stage 3 unspecified: Secondary | ICD-10-CM | POA: Diagnosis not present

## 2021-09-08 DIAGNOSIS — L570 Actinic keratosis: Secondary | ICD-10-CM | POA: Diagnosis not present

## 2021-09-08 DIAGNOSIS — L905 Scar conditions and fibrosis of skin: Secondary | ICD-10-CM | POA: Diagnosis not present

## 2021-09-09 DIAGNOSIS — E1122 Type 2 diabetes mellitus with diabetic chronic kidney disease: Secondary | ICD-10-CM | POA: Diagnosis not present

## 2021-09-09 DIAGNOSIS — E1165 Type 2 diabetes mellitus with hyperglycemia: Secondary | ICD-10-CM | POA: Diagnosis not present

## 2021-09-09 DIAGNOSIS — I959 Hypotension, unspecified: Secondary | ICD-10-CM | POA: Diagnosis not present

## 2021-09-09 DIAGNOSIS — M5136 Other intervertebral disc degeneration, lumbar region: Secondary | ICD-10-CM | POA: Diagnosis not present

## 2021-09-09 DIAGNOSIS — I129 Hypertensive chronic kidney disease with stage 1 through stage 4 chronic kidney disease, or unspecified chronic kidney disease: Secondary | ICD-10-CM | POA: Diagnosis not present

## 2021-09-09 DIAGNOSIS — N183 Chronic kidney disease, stage 3 unspecified: Secondary | ICD-10-CM | POA: Diagnosis not present

## 2021-09-09 DIAGNOSIS — M069 Rheumatoid arthritis, unspecified: Secondary | ICD-10-CM | POA: Diagnosis not present

## 2021-09-09 DIAGNOSIS — T8484XD Pain due to internal orthopedic prosthetic devices, implants and grafts, subsequent encounter: Secondary | ICD-10-CM | POA: Diagnosis not present

## 2021-09-09 DIAGNOSIS — G8929 Other chronic pain: Secondary | ICD-10-CM | POA: Diagnosis not present

## 2021-09-09 DIAGNOSIS — E1142 Type 2 diabetes mellitus with diabetic polyneuropathy: Secondary | ICD-10-CM | POA: Diagnosis not present

## 2021-09-15 DIAGNOSIS — I959 Hypotension, unspecified: Secondary | ICD-10-CM | POA: Diagnosis not present

## 2021-09-15 DIAGNOSIS — I129 Hypertensive chronic kidney disease with stage 1 through stage 4 chronic kidney disease, or unspecified chronic kidney disease: Secondary | ICD-10-CM | POA: Diagnosis not present

## 2021-09-15 DIAGNOSIS — G8929 Other chronic pain: Secondary | ICD-10-CM | POA: Diagnosis not present

## 2021-09-15 DIAGNOSIS — T8484XD Pain due to internal orthopedic prosthetic devices, implants and grafts, subsequent encounter: Secondary | ICD-10-CM | POA: Diagnosis not present

## 2021-09-15 DIAGNOSIS — E1142 Type 2 diabetes mellitus with diabetic polyneuropathy: Secondary | ICD-10-CM | POA: Diagnosis not present

## 2021-09-15 DIAGNOSIS — M069 Rheumatoid arthritis, unspecified: Secondary | ICD-10-CM | POA: Diagnosis not present

## 2021-09-15 DIAGNOSIS — E1122 Type 2 diabetes mellitus with diabetic chronic kidney disease: Secondary | ICD-10-CM | POA: Diagnosis not present

## 2021-09-15 DIAGNOSIS — N183 Chronic kidney disease, stage 3 unspecified: Secondary | ICD-10-CM | POA: Diagnosis not present

## 2021-09-15 DIAGNOSIS — E1165 Type 2 diabetes mellitus with hyperglycemia: Secondary | ICD-10-CM | POA: Diagnosis not present

## 2021-09-15 DIAGNOSIS — M5136 Other intervertebral disc degeneration, lumbar region: Secondary | ICD-10-CM | POA: Diagnosis not present

## 2021-09-17 ENCOUNTER — Other Ambulatory Visit: Payer: Self-pay

## 2021-09-17 ENCOUNTER — Encounter: Payer: Self-pay | Admitting: Family Medicine

## 2021-09-17 ENCOUNTER — Ambulatory Visit (HOSPITAL_BASED_OUTPATIENT_CLINIC_OR_DEPARTMENT_OTHER)
Admission: RE | Admit: 2021-09-17 | Discharge: 2021-09-17 | Disposition: A | Discharge status: Home / Self Care | Payer: Medicare HMO | Source: Ambulatory Visit | Attending: Family Medicine | Admitting: Family Medicine

## 2021-09-17 ENCOUNTER — Ambulatory Visit (INDEPENDENT_AMBULATORY_CARE_PROVIDER_SITE_OTHER): Payer: Medicare HMO | Admitting: Family Medicine

## 2021-09-17 VITALS — BP 130/70 | Ht 70.5 in | Wt 225.0 lb

## 2021-09-17 DIAGNOSIS — M79672 Pain in left foot: Secondary | ICD-10-CM | POA: Diagnosis not present

## 2021-09-17 DIAGNOSIS — M7989 Other specified soft tissue disorders: Secondary | ICD-10-CM | POA: Diagnosis not present

## 2021-09-17 DIAGNOSIS — M84375D Stress fracture, left foot, subsequent encounter for fracture with routine healing: Secondary | ICD-10-CM | POA: Insufficient documentation

## 2021-09-17 DIAGNOSIS — R609 Edema, unspecified: Secondary | ICD-10-CM | POA: Diagnosis not present

## 2021-09-17 IMAGING — DX DG FOOT COMPLETE 3+V*L*
3 series · 3 of 3 positions shown · non-contrast
Comparison: None.

CLINICAL DATA: Left foot pain.

EXAM:
LEFT FOOT - COMPLETE 3+ VIEW

[foot ap]
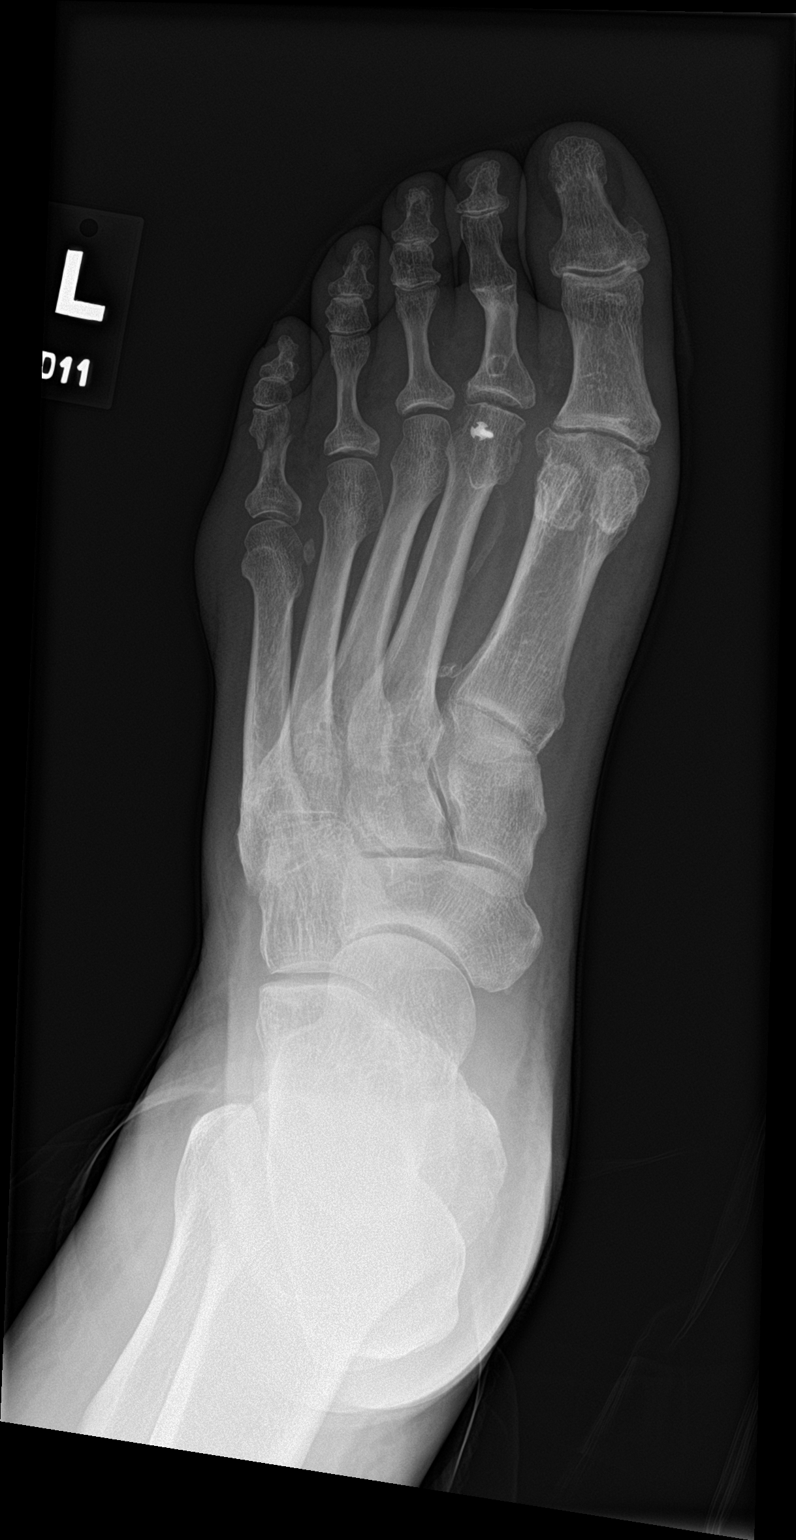

[foot obl]
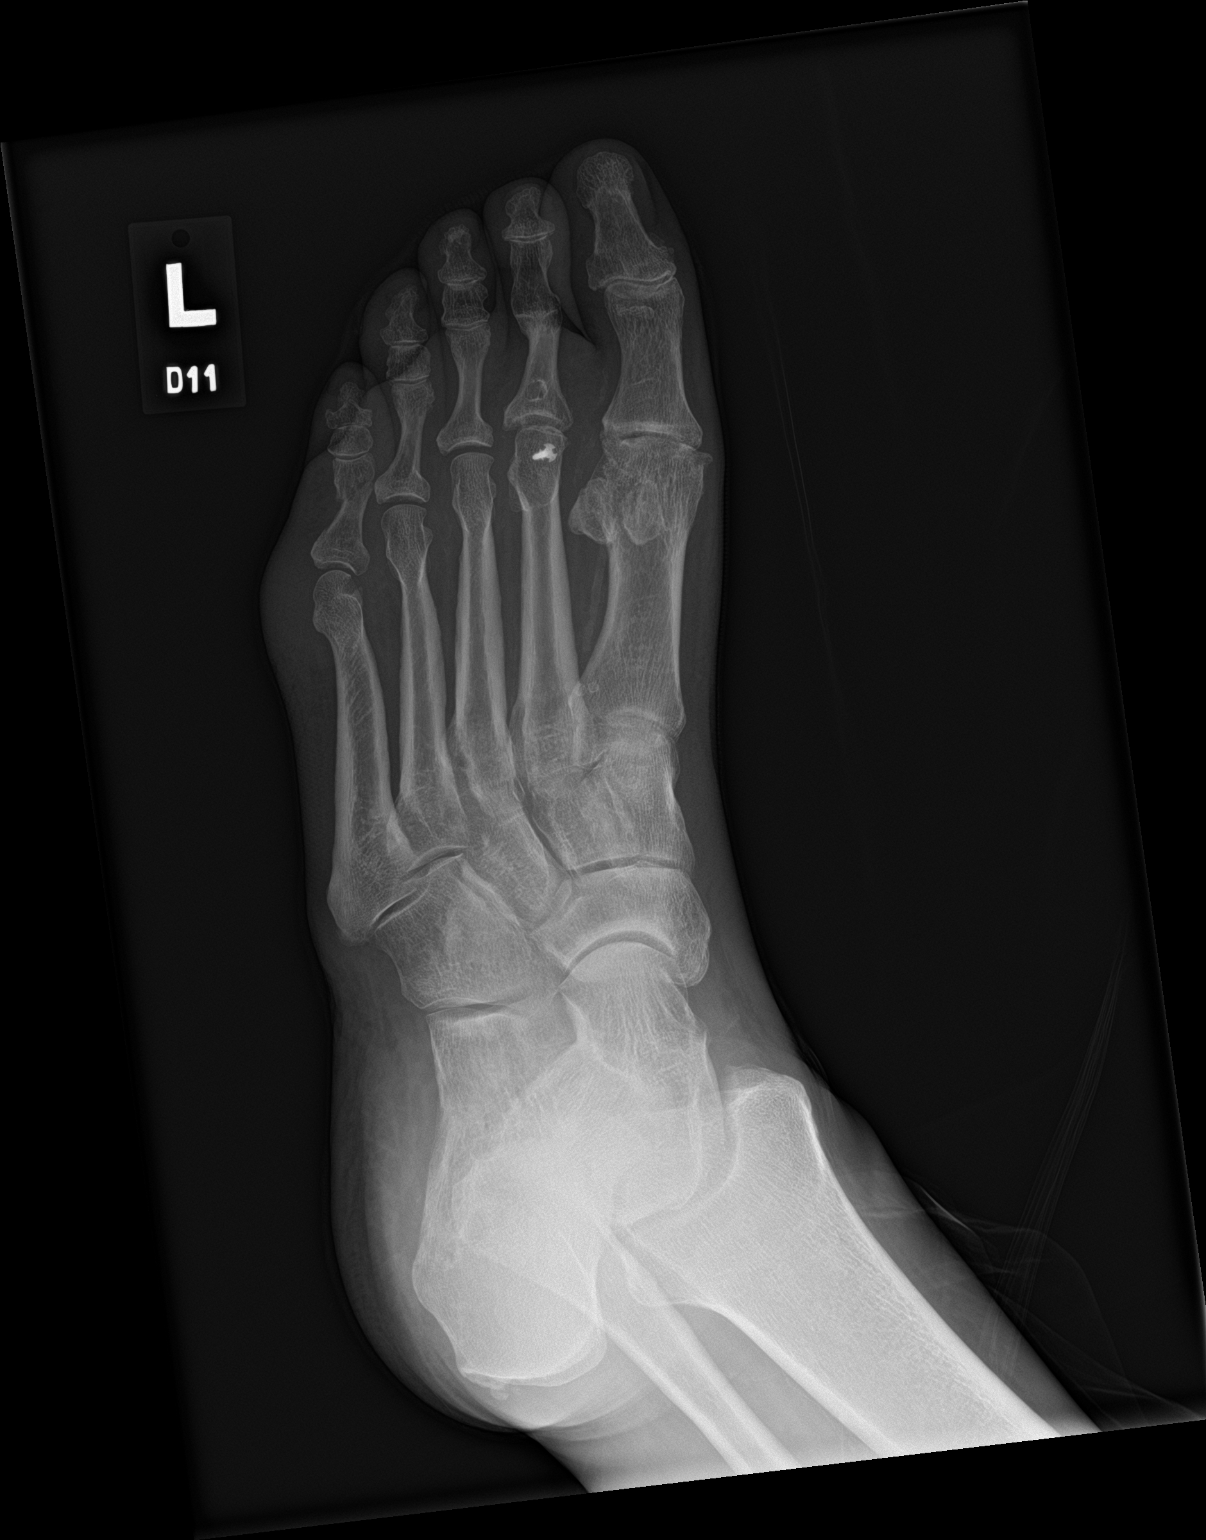

[foot lat]
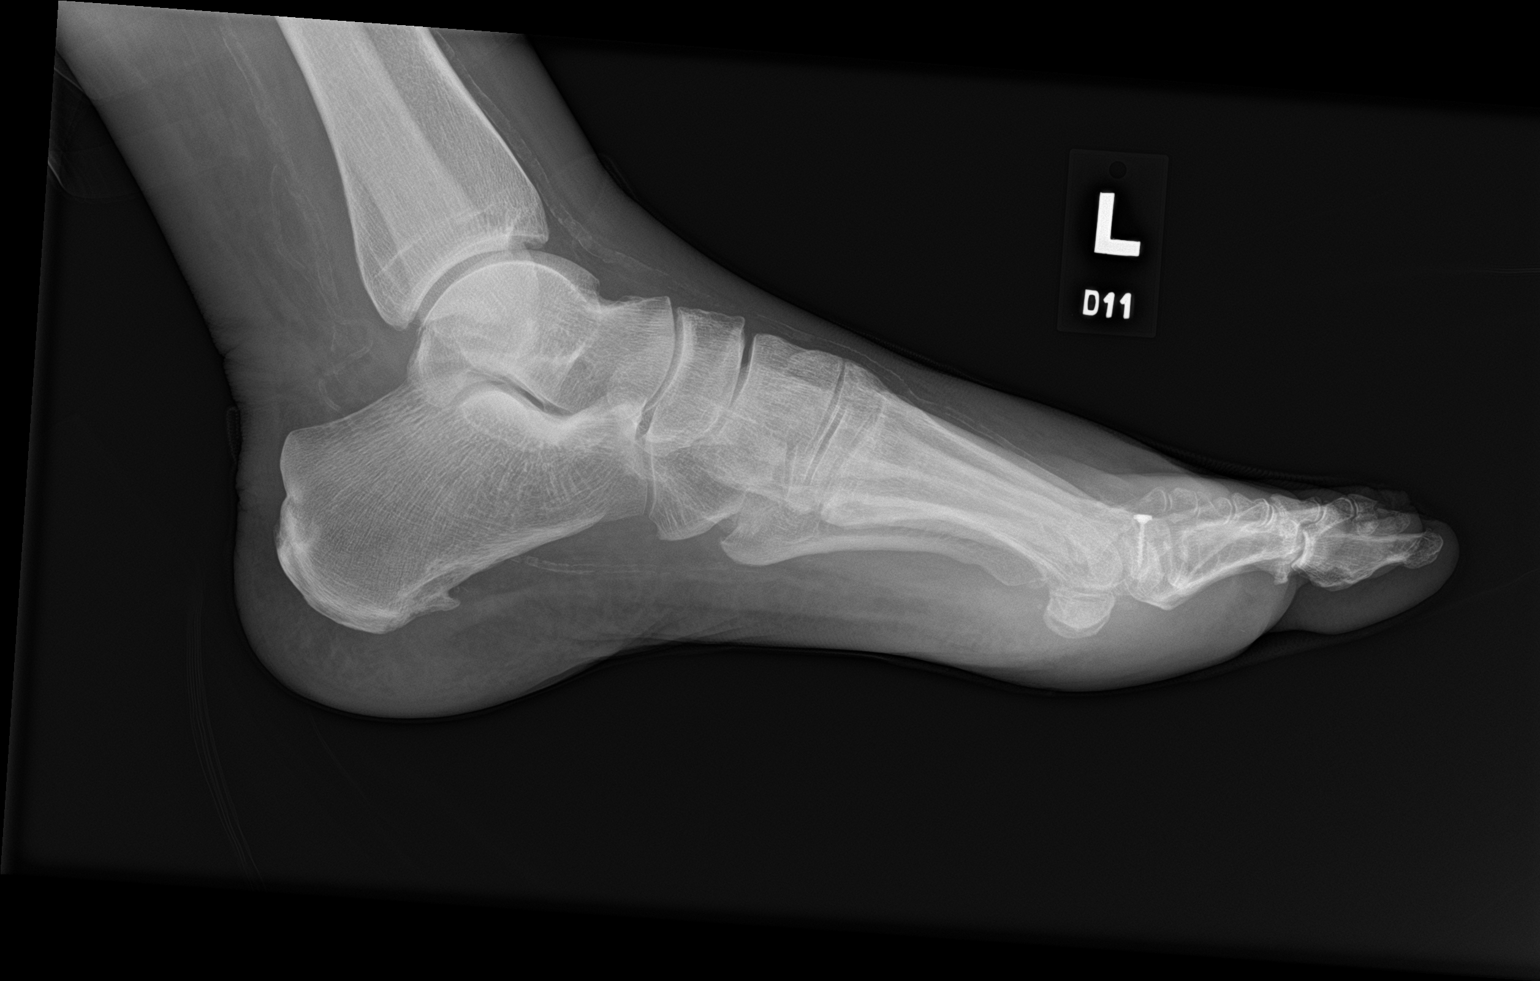

[3 of 3 positions shown; findings below may reference images not displayed]

FINDINGS: There is soft tissue swelling of the forefoot. Vascular
calcifications are noted in the distal foreleg and extending into
the foot through into the distal forefoot.

There is an oblique fracture of the distal shaft at the junction
with the metaphysis involving the small toe proximal phalanx with
slight lateral displacement of the distal fragment and slight
lateral tilting of the distal fragment.

There is no callus formation about the fracture site and this is
likely recent.

No further fractures are identified. Moderate joint space loss and
spurring seen at the first MTP joint, ankylosis across the second
toe PIP joint with mild degenerative changes of the toe joints.

Other joints are maintained. There is a small plantar calcaneal
enthesophyte.

A small threaded screw enters the second metatarsal head from
superiorly.
IMPRESSION: 1. Oblique fracture of the distal small toe proximal phalanx with
slight lateral displacement and lateral angulation.
2. Normal bone mineralization with degenerative changes described
above, ankylosis second toe MTP joint and a single short screw in
the head of the second metatarsal.
3. Edema in the forefoot, with vascular calcifications in the distal
foreleg and foot with appearance suggesting diabetic
atherosclerosis.
4. Small plantar calcaneal enthesophyte.

## 2021-09-17 MED ORDER — ENBREL MINI 50 MG/ML ~~LOC~~ SOCT
50.0000 mg | SUBCUTANEOUS | 0 refills | Status: DC
Start: 2021-09-17 — End: 2022-02-08

## 2021-09-17 NOTE — Patient Instructions (Signed)
Good to see you Please try ice as needed  Please start back to therapy  I will call with the results from today   Please send me a message in MyChart with any questions or updates.  Please see me back in 4 weeks.   --Dr. Raeford Razor

## 2021-09-17 NOTE — Assessment & Plan Note (Signed)
Having improvement with pain and swelling. -Counseled on home exercise therapy and supportive care. -X-ray. -Initiate therapy and try to walk.  May need to get further imaging if pain resurfaces.

## 2021-09-17 NOTE — Progress Notes (Signed)
Tommy Green - 75 y.o. male MRN [id number]  Date of birth: 07-14-1946  SUBJECTIVE:  Including CC & ROS.  No chief complaint on file.   Tommy Green is a 75 y.o. male that is following up for his left foot pain.  The swelling is improved.  He denies any significant pain in the midfoot.   Review of Systems See HPI   HISTORY: Past Medical, Surgical, Social, and Family History Reviewed & Updated per EMR.   Pertinent Historical Findings include:  Past Medical History:  Diagnosis Date   Anemia    Essential hypertension 07/18/2017   Foot drop, left foot    due to back surgery in 01/2021   History of colonic polyps    History of rheumatic fever    History of stroke 07/18/2017   Hypertriglyceridemia 07/18/2017   Type 2 diabetes mellitus with diabetic neuropathy, unspecified ([place]) 07/18/2017    Past Surgical History:  Procedure Laterality Date   COLONOSCOPY     around 2018 [facility]   COLONOSCOPY  09/02/2020   CRANIOTOMY Left 10/25/2019   Procedure: CRANIOTOMY HEMATOMA EVACUATION SUBDURAL;  Surgeon: Vallarie Mare, MD;  Location: [place];  Service: Neurosurgery;  Laterality: Left;   ESOPHAGOGASTRODUODENOSCOPY     around 2018 with [facility]   FOOT CAPSULOTOMY Left 07/25/2008   Mid Foot #2 MPJ   Hammertoe Repair Left 07/25/2008   #2 toe   SPINAL FUSION  01/2021   TARSAL TUNNEL RELEASE Left 07/25/2008   UPPER GASTROINTESTINAL ENDOSCOPY  09/02/2020    Family History  Problem Relation Age of Onset   Hyperlipidemia Mother    Colon polyps Mother    Hyperlipidemia Father    Alcoholism Brother    Healthy Son    Healthy Son    Cancer Neg Hx    Colon cancer Neg Hx    Esophageal cancer Neg Hx    Rectal cancer Neg Hx    Stomach cancer Neg Hx     Social History   Socioeconomic History   Marital status: Married    Spouse name: Not on file   Number of children: Not on file   Years of education: Not on file   Highest education level: Not on file   Occupational History   Not on file  Tobacco Use   Smoking status: Former    Years: 10.00    Types: Cigarettes    Quit date: 1973    Years since quitting: 49.9   Smokeless tobacco: Never  Vaping Use   Vaping Use: Never used  Substance and Sexual Activity   Alcohol use: Yes    Comment: occ beer   Drug use: No   Sexual activity: Yes  Other Topics Concern   Not on file  Social History Narrative   Not on file   Social Determinants of Health   Financial Resource Strain: Not on file  Food Insecurity: Not on file  Transportation Needs: Not on file  Physical Activity: Not on file  Stress: Not on file  Social Connections: Not on file  Intimate Partner Violence: Not on file     PHYSICAL EXAM:  VS: BP 130/70 (BP Location: Right Arm, Patient Position: Sitting)   Ht 5' 10.5" (1.791 m)   Wt 225 lb (102.1 kg)   BMI 31.83 kg/m  Physical Exam Gen: NAD, alert, cooperative with exam, well-appearing    ASSESSMENT & PLAN:   Stress fracture of left foot Having improvement with pain and swelling. -  Counseled on home exercise therapy and supportive care. -X-ray. -Initiate therapy and try to walk.  May need to get further imaging if pain resurfaces.

## 2021-09-17 NOTE — Telephone Encounter (Signed)
[name] left a voicemail stating a new prescription is needed for patient's Enbrel before they will be able to refill it.  They will fax a request to our office.  [name] requested a return call after 4:00 pm today 12/1 or tomorrow 09/18/21 to let her know the prescription was sent.

## 2021-09-17 NOTE — Telephone Encounter (Signed)
Next Visit: 11/17/2021   Last Visit: 06/16/2021   Last Fill: 06/23/2021   DX: Rheumatoid arthritis involving multiple sites with positive rheumatoid factor    Current Dose per office note 06/16/2021: Enbrel mini 50 mg sq injections once weekly   Labs: 06/16/2021 RBC count, hgb, and hct remain low but have improved. Creatinine is elevated-1.50 and GFR is low-48.  Alk phos is slightly low-32  TB Gold: 06/16/2021 Neg   Okay to refill Enbrel?

## 2021-09-18 ENCOUNTER — Telehealth: Payer: Self-pay | Admitting: Family Medicine

## 2021-09-18 DIAGNOSIS — E1142 Type 2 diabetes mellitus with diabetic polyneuropathy: Secondary | ICD-10-CM | POA: Diagnosis not present

## 2021-09-18 DIAGNOSIS — T8484XD Pain due to internal orthopedic prosthetic devices, implants and grafts, subsequent encounter: Secondary | ICD-10-CM | POA: Diagnosis not present

## 2021-09-18 DIAGNOSIS — E1165 Type 2 diabetes mellitus with hyperglycemia: Secondary | ICD-10-CM | POA: Diagnosis not present

## 2021-09-18 DIAGNOSIS — G8929 Other chronic pain: Secondary | ICD-10-CM | POA: Diagnosis not present

## 2021-09-18 DIAGNOSIS — E1122 Type 2 diabetes mellitus with diabetic chronic kidney disease: Secondary | ICD-10-CM | POA: Diagnosis not present

## 2021-09-18 DIAGNOSIS — I6930 Unspecified sequelae of cerebral infarction: Secondary | ICD-10-CM

## 2021-09-18 DIAGNOSIS — N183 Chronic kidney disease, stage 3 unspecified: Secondary | ICD-10-CM | POA: Diagnosis not present

## 2021-09-18 DIAGNOSIS — I959 Hypotension, unspecified: Secondary | ICD-10-CM | POA: Diagnosis not present

## 2021-09-18 DIAGNOSIS — M069 Rheumatoid arthritis, unspecified: Secondary | ICD-10-CM | POA: Diagnosis not present

## 2021-09-18 DIAGNOSIS — I129 Hypertensive chronic kidney disease with stage 1 through stage 4 chronic kidney disease, or unspecified chronic kidney disease: Secondary | ICD-10-CM | POA: Diagnosis not present

## 2021-09-18 DIAGNOSIS — M5136 Other intervertebral disc degeneration, lumbar region: Secondary | ICD-10-CM | POA: Diagnosis not present

## 2021-09-18 NOTE — Telephone Encounter (Signed)
OK, if he needs a referral/order, please place. Ty.

## 2021-09-18 NOTE — Telephone Encounter (Signed)
[facility] is calling stating patient needs orders to go to [facility]. They will be discharging from home health physical therapy and he agreed to follow up at an outpatient clinic. Please advice.

## 2021-09-18 NOTE — Telephone Encounter (Signed)
done

## 2021-09-18 NOTE — Telephone Encounter (Signed)
What diagnosis should I use?

## 2021-09-18 NOTE — Telephone Encounter (Signed)
History of stroke with residual effects. Ty.

## 2021-09-21 ENCOUNTER — Telehealth: Payer: Self-pay | Admitting: Family Medicine

## 2021-09-21 NOTE — Telephone Encounter (Signed)
Informed of results.   Rosemarie Ax, MD [facility] 09/21/2021, 9:19 AM

## 2021-09-22 DIAGNOSIS — E1122 Type 2 diabetes mellitus with diabetic chronic kidney disease: Secondary | ICD-10-CM | POA: Diagnosis not present

## 2021-09-22 DIAGNOSIS — I959 Hypotension, unspecified: Secondary | ICD-10-CM | POA: Diagnosis not present

## 2021-09-22 DIAGNOSIS — N183 Chronic kidney disease, stage 3 unspecified: Secondary | ICD-10-CM | POA: Diagnosis not present

## 2021-09-22 DIAGNOSIS — M069 Rheumatoid arthritis, unspecified: Secondary | ICD-10-CM | POA: Diagnosis not present

## 2021-09-22 DIAGNOSIS — G8929 Other chronic pain: Secondary | ICD-10-CM | POA: Diagnosis not present

## 2021-09-22 DIAGNOSIS — T8484XD Pain due to internal orthopedic prosthetic devices, implants and grafts, subsequent encounter: Secondary | ICD-10-CM | POA: Diagnosis not present

## 2021-09-22 DIAGNOSIS — E1165 Type 2 diabetes mellitus with hyperglycemia: Secondary | ICD-10-CM | POA: Diagnosis not present

## 2021-09-22 DIAGNOSIS — I129 Hypertensive chronic kidney disease with stage 1 through stage 4 chronic kidney disease, or unspecified chronic kidney disease: Secondary | ICD-10-CM | POA: Diagnosis not present

## 2021-09-22 DIAGNOSIS — M5136 Other intervertebral disc degeneration, lumbar region: Secondary | ICD-10-CM | POA: Diagnosis not present

## 2021-09-22 DIAGNOSIS — E1142 Type 2 diabetes mellitus with diabetic polyneuropathy: Secondary | ICD-10-CM | POA: Diagnosis not present

## 2021-09-28 ENCOUNTER — Telehealth: Payer: Self-pay | Admitting: Pharmacist

## 2021-09-28 NOTE — Telephone Encounter (Signed)
Submitted Patient Assistance Application to [employer] for ENBREL along with provider portion, patient portoin, med list, insurance card copy, PA and income documents. Will update patient when we receive a response.  Fax# [phone] Phone# [phone]  Knox Saliva, PharmD, MPH, BCPS Clinical Pharmacist (Rheumatology and Pulmonology)

## 2021-09-28 NOTE — Telephone Encounter (Signed)
Received signed patient forms for Enbrel PAP renewal through [employer]. Insurance card copy, med list, and prior auth letter collected.  Provider portion pending sig- placed in folder to be signed.  Knox Saliva, PharmD, MPH, BCPS Clinical Pharmacist (Rheumatology and Pulmonology)

## 2021-10-02 DIAGNOSIS — M21372 Foot drop, left foot: Secondary | ICD-10-CM | POA: Insufficient documentation

## 2021-10-02 DIAGNOSIS — Z860100 Personal history of colon polyps, unspecified: Secondary | ICD-10-CM | POA: Insufficient documentation

## 2021-10-02 DIAGNOSIS — Z8679 Personal history of other diseases of the circulatory system: Secondary | ICD-10-CM | POA: Insufficient documentation

## 2021-10-07 DIAGNOSIS — M4320 Fusion of spine, site unspecified: Secondary | ICD-10-CM | POA: Diagnosis not present

## 2021-10-08 ENCOUNTER — Other Ambulatory Visit: Payer: Self-pay

## 2021-10-08 ENCOUNTER — Ambulatory Visit: Payer: Medicare HMO | Attending: Family Medicine | Admitting: Physical Therapy

## 2021-10-08 ENCOUNTER — Encounter: Payer: Self-pay | Admitting: Physical Therapy

## 2021-10-08 DIAGNOSIS — I6930 Unspecified sequelae of cerebral infarction: Secondary | ICD-10-CM | POA: Insufficient documentation

## 2021-10-08 DIAGNOSIS — M21372 Foot drop, left foot: Secondary | ICD-10-CM | POA: Diagnosis not present

## 2021-10-08 DIAGNOSIS — R2689 Other abnormalities of gait and mobility: Secondary | ICD-10-CM | POA: Diagnosis not present

## 2021-10-08 DIAGNOSIS — M545 Low back pain, unspecified: Secondary | ICD-10-CM | POA: Insufficient documentation

## 2021-10-08 DIAGNOSIS — G8929 Other chronic pain: Secondary | ICD-10-CM

## 2021-10-08 DIAGNOSIS — R2681 Unsteadiness on feet: Secondary | ICD-10-CM

## 2021-10-08 DIAGNOSIS — M6281 Muscle weakness (generalized): Secondary | ICD-10-CM | POA: Diagnosis not present

## 2021-10-08 NOTE — Telephone Encounter (Signed)
Called [employer] for update on patient's Enbrel PAP renewal application. Per rep, received a verbal confirmation from [place] regarding an approval for ENBREL patient assistance from 10/08/21 to 10/17/22.  Phone: [phone]  Knox Saliva, PharmD, MPH, BCPS Clinical Pharmacist (Rheumatology and Pulmonology)

## 2021-10-08 NOTE — Patient Instructions (Signed)
Access Code: [email or web address] URL: [email or web address] Date: 10/08/2021 Prepared by: Almyra Free  Exercises Sit to Stand with Counter Support - 3 x daily - 7 x weekly - 1 sets - 5-10 reps Supine Bridge - 2 x daily - 7 x weekly - 2 sets - 10 reps - 5 sec hold Standing March with Counter Support - 2 x daily - 7 x weekly - 2 sets - 10 reps Seated Heel Raise - 1 x daily - 7 x weekly - 2 sets - 10 reps - 10 sec hold

## 2021-10-08 NOTE — Therapy (Signed)
[facility] [address]  [place], [place], [address] Phone: [phone]   Fax:  [phone]  Physical Therapy Evaluation  Patient Details  Name: Tommy Green MRN: [id number] Date of Birth: Jul 16, 1946 Referring Provider (PT): Riki Sheer, [place]   Encounter Date: 10/08/2021   PT End of Session - 10/08/21 0912     Visit Number 1    Number of Visits 16    Date for PT Re-Evaluation 12/03/21    Authorization Type Aetna MCR    Progress Note Due on Visit 10    PT Start Time 0915    PT Stop Time 1006    PT Time Calculation (min) 51 min    Activity Tolerance Patient tolerated treatment well    Behavior During Therapy [facility] for tasks assessed/performed             Past Medical History:  Diagnosis Date   Anemia    Essential hypertension 07/18/2017   Foot drop, left foot    due to back surgery in 01/2021   History of colonic polyps    History of rheumatic fever    History of stroke 07/18/2017   Hypertriglyceridemia 07/18/2017   Type 2 diabetes mellitus with diabetic neuropathy, unspecified ([place]) 07/18/2017    Past Surgical History:  Procedure Laterality Date   COLONOSCOPY     around 2018 [facility]   COLONOSCOPY  09/02/2020   CRANIOTOMY Left 10/25/2019   Procedure: CRANIOTOMY HEMATOMA EVACUATION SUBDURAL;  Surgeon: Vallarie Mare, MD;  Location: [facility];  Service: Neurosurgery;  Laterality: Left;   ESOPHAGOGASTRODUODENOSCOPY     around 2018 with [facility]   FOOT CAPSULOTOMY Left 07/25/2008   Mid Foot #2 MPJ   Hammertoe Repair Left 07/25/2008   #2 toe   SPINAL FUSION  01/2021   TARSAL TUNNEL RELEASE Left 07/25/2008   UPPER GASTROINTESTINAL ENDOSCOPY  09/02/2020    There were no vitals filed for this visit.    Subjective Assessment - 10/08/21 0917     Subjective Patient reports weakness following back surgery 02/02/21. He developed drop foot on the left after surgery and had difficulty  walking. He has had HHPTand OT until last week. He is now able to ascend/descend 3 steps in his garage now with use of railing. He uses a RW at home and a rollator in the community. He was not using an AD prior to surgery. He needs assist with getting in/out of bed and with dressing. He would like to get back to driving. He mainly sits at home according to his wife other than to go to the bathroom.    Pertinent History T2DM, HTN, spinal fusion 4/22, heart murmur, brain bleed Jan 2020, RA    How long can you walk comfortably? 10 min    Currently in Pain? Yes    Pain Score 4     Pain Location Back    Pain Orientation Mid;Lower    Pain Descriptors / Indicators Sore    Pain Type Surgical pain                [facility] PT Assessment - 10/08/21 0001       Assessment   Medical Diagnosis h/o stroke with residual effects    Referring Provider (PT) Riki Sheer, DO    Onset Date/Surgical Date 02/02/21    Hand Dominance Right    Prior Therapy HHPT until 2 weeks ago      Precautions  Precautions Fall    Required Braces or Orthoses Other Brace/Splint    Other Brace/Splint Has AFO for left foot but does not have today.      Restrictions   Weight Bearing Restrictions No      Balance Screen   Has the patient fallen in the past 6 months Yes   May, August   How many times? 2   not sure cause in August   Has the patient had a decrease in activity level because of a fear of falling?  Yes    Is the patient reluctant to leave their home because of a fear of falling?  Yes      [place] Private residence    Living Arrangements Spouse/significant other    Available Help at Discharge Family    Type of [place] to enter    Entrance Stairs-Number of Steps 3    Entrance Stairs-Rails Can reach both    [place] One level    [place] - 2 wheels;Walker - 4 wheels;Shower seat;Grab bars - toilet;Grab bars - tub/shower      Prior  Function   Level of Independence Independent    Vocation Retired      Charity fundraiser Status Within Functional Limits for tasks assessed      Coordination   Gross Motor Movements are Fluid and Coordinated Yes      ROM / Strength   AROM / PROM / Strength AROM;Strength      AROM   Overall AROM Comments Bil LE WFL but limited by tight muscles especially hip IR and hip flexion; lumbar not tested but good extension with bridging      Strength   Overall Strength Comments left ankle: no active DF or Ever; trace Inv, PF 3/4; Rt ankle DF 3/5, PF 4/5; Bil shoulders and elbow flex grossly 5/5 except Rt ER/IR 4 to 4+/5    Strength Assessment Site Hip;Knee    Right/Left Hip Right;Left    Right Hip Flexion 3/5    Right Hip Extension 4+/5    Right Hip ABduction 5/5   in sitting   Right Hip ADduction 4+/5   in sititng   Left Hip Flexion 3+/5    Left Hip Extension 4+/5    Left Hip ABduction 5/5   in sitting   Left Hip ADduction 4+/5   in sitting   Right/Left Knee Right;Left    Right Knee Flexion 4+/5    Right Knee Extension 5/5    Left Knee Flexion 4/5    Left Knee Extension 5/5      Flexibility   Soft Tissue Assessment /Muscle Length yes   marked tightness with bil Hip IR Lt>Rt; left gastroc tightness   Hamstrings marked bil; nerve pull on right    Piriformis WFL      Transfers   Five time sit to stand comments  19.43 sec    Comments Ind with bed mobility in clinic      Ambulation/Gait   Ambulation/Gait Yes    Ambulation/Gait Assistance 6: Modified independent (Device/Increase time)    Ambulation Distance (Feet) 80 Feet    Assistive device 4-wheeled walker    Gait Pattern Step-through pattern;Decreased dorsiflexion - right;Decreased dorsiflexion - left;Left steppage    Ambulation Surface Level;Indoor    Gait Comments able to correct right heel strike with VCs      Standardized Balance Assessment   Standardized  Balance Assessment Timed Up and Go Test      Timed Up  and Go Test   Normal TUG (seconds) 26.5    TUG Comments with rollator                        Objective measurements completed on examination: See above findings.                PT Education - 10/08/21 1144     Education Details HEP; discussed POC; discussed setting goals and writing them down to check off daily accomplishments of exercises, increasing walk time, increasing active movement to every hour    Person(s) Educated Patient;Spouse    Methods Explanation;Demonstration;Verbal cues;Handout    Comprehension Verbalized understanding;Returned demonstration              PT Short Term Goals - 10/08/21 1146       PT SHORT TERM GOAL #1   Title Ind and compliant with initial HEP    Time 4    Period Weeks    Status New    Target Date 11/05/21      PT SHORT TERM GOAL #2   Title Improved 5x sit to stand to <= 15 sec    Time 4    Period Weeks    Status New      PT SHORT TERM GOAL #3   Title Improved TUG to <= 16 sec    Time 4    Period Weeks    Status New               PT Long Term Goals - 10/08/21 1149       PT LONG TERM GOAL #1   Title Ind and compliant with HEP to maintain therapy gains    Time 8    Period Weeks    Status New    Target Date 12/03/21      PT LONG TERM GOAL #2   Title Patient able to safely ambulate at home in community with least restrictive AD and a normal gait pattern including good heel strike bil (with or without AFO).    Time 8    Period Weeks    Status New      PT LONG TERM GOAL #3   Title Patient to demo increased bil LE strength to allow him to don/doff his pants independently and apply pressure to gas/brake pedals to allow return to driving.    Time 8    Period Weeks    Status New      PT LONG TERM GOAL #4   Title Patient to demonstrate improved 5x sit to stand to <= 12 sec and TUG to <= 10 sec (with rollator) to decrease risk of falls.    Time 8    Period Weeks    Status New      PT LONG  TERM GOAL #5   Title Patient able to get in/out of bed independently at home.    Time 8    Period Weeks    Status New      Additional Long Term Goals   Additional Long Term Goals Yes      PT LONG TERM GOAL #6   Title Patient able to demo increased LE strength by being able to ascend/descend his stairs at home with a reciprocal gait pattern using railing.    Time 8    Period Weeks    Status New  Plan - 10/08/21 1009     Clinical Impression Statement Patient presents with reports of left foot drop and general weakness s/p lumbar fusion in April 2022. He was discharged from [facility] and OT about 2 weeks ago. He amb with a rollator walker in the community and a RW at home. He did not use an AD prior to surgery. He amb with a steppage gait on the left and needs cueing for increased heel strike on the right. He has an AFO, but does not use it. Pt encouraged to wear the AFO especially in the community.  He has marked left ankle weakness and weakness in the right ankle as well. He also has weakness in bil hips and knee flexors and flexibility deficits in his hip rotators, HS and gastoc/soleus.  His 5x sit to stand is 19.43 sec and TUG is 26.5 sec, both indicating that he is at an increased risk for falls. He has had two falls in the past 6 months. One post surgery at home and the second in August at home, he fell backwards when walking; unsure why. He states that he is not fully compliant with his current HEP and tends to sit a lot during the day. Patient would like to be able to walk independently or at least with a cane. He also would like to be able to drive again and get his pants on independently, but presently has hip flexor weakness in sitting preventing these tasks. He also needs assistance with getting out of bed at home. He will benefit from skilled PT to address these deficits.    Personal Factors and Comorbidities Comorbidity 3+;Time since onset of  injury/illness/exacerbation;Behavior Pattern    Comorbidities T2DM, HTN, spinal fusion 4/22, heart murmur, brain bleed Jan 2020, RA, neuropathy    Examination-Activity Limitations Bathing;Bed Mobility;Locomotion Level;Dressing    Examination-Participation Restrictions Driving    Stability/Clinical Decision Making Stable/Uncomplicated    Clinical Decision Making Low    Rehab Potential Good    PT Frequency 2x / week    PT Duration 8 weeks    PT Treatment/Interventions ADLs/Self Care Home Management;Aquatic Therapy;Biofeedback;Cryotherapy;Electrical Stimulation;Moist Heat;Neuromuscular re-education;Balance training;Therapeutic exercise;Therapeutic activities;Functional mobility training;Stair training;Gait training;Patient/family education;Manual techniques;Dry needling;Passive range of motion;Taping    PT Next Visit Plan Review HEP and progress (add SKTC); LE strengthening (including ankles) using machines for greater resistance and cardio equipment; try NMES for left ant tib activation; balance/gait/stairs; see functional goals    PT Home Exercise Plan [email or web address]    Consulted and Agree with Plan of Care Patient;Family member/caregiver   wife, Fraser Din            Patient will benefit from skilled therapeutic intervention in order to improve the following deficits and impairments:  Abnormal gait, Decreased range of motion, Pain, Decreased activity tolerance, Decreased balance, Impaired flexibility, Decreased strength  Visit Diagnosis: Muscle weakness (generalized) - Plan: PT plan of care cert/re-cert  Foot drop, left - Plan: PT plan of care cert/re-cert  Unsteadiness on feet - Plan: PT plan of care cert/re-cert  Other abnormalities of gait and mobility - Plan: PT plan of care cert/re-cert  Chronic midline low back pain, unspecified whether sciatica present - Plan: PT plan of care cert/re-cert     Problem List Patient Active Problem List   Diagnosis Date Noted   Foot drop, left foot  10/02/2021   History of colonic polyps 10/02/2021   History of rheumatic fever 10/02/2021   Stress fracture of left foot 09/03/2021   Gabapentin overdose, accidental  or unintentional, initial encounter 03/08/2021   TIA (transient ischemic attack) 03/05/2021   Body mass index (BMI) 36.0-36.9, adult 10/03/2020   Subdural hemorrhage following injury without open intracranial wound and with prolonged loss of consciousness (more than 24 hours) without return to pre-existing conscious level ([place]) 10/03/2020   AKI (acute kidney injury) ([place])    Acute encephalopathy    SDH (subdural hematoma) 18/84/1660   Cyclic vomiting syndrome 08/06/2019   Neuropathy 12/25/2018   AK (actinic keratosis) 08/24/2018   Neoplasm of uncertain behavior 63/10/6008   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   Tendinopathy of right gluteus medius 07/13/2018   Tendinopathy of left gluteus medius 07/13/2018   Squamous cell carcinoma in situ (SCCIS) of skin 03/24/2018   Vitamin D deficiency 11/29/2017   Stage 3 chronic kidney disease ([place]) 10/28/2017   Primary osteoarthritis of right hip 09/11/2017   Essential hypertension 07/18/2017   Hyperlipidemia associated with type 2 diabetes mellitus ([place]) 07/18/2017   Type 2 diabetes mellitus with diabetic neuropathy, unspecified ([place]) 07/18/2017   Heart murmur 07/18/2017   Hypertriglyceridemia 07/18/2017   History of stroke 07/18/2017   DDD (degenerative disc disease), lumbar 06/15/2017   Iron deficiency anemia 06/15/2017   Stroke ([place]) 06/15/2017   Encounter for hepatitis C screening test for low risk patient 06/30/2016   Microalbuminuria due to type 2 diabetes mellitus ([place]) 03/19/2016   Hypertrophy of inferior nasal turbinate 12/20/2015   Disease of nasal cavity and sinuses 12/10/2015   Degenerative tear of acetabular labrum of left hip 11/04/2015   History of tobacco abuse 05/15/2015   Morbid (severe) obesity due to excess calories ([place]) 04/11/2015   Disorder of both eustachian  tubes 01/27/2015   Maxillary sinusitis 01/13/2015   Lipoprotein deficiency 01/31/2014   Gastro-esophageal reflux disease without esophagitis 11/02/2013   Rheumatic fever 11/02/2013   Erectile dysfunction associated with type 2 diabetes mellitus ([place]) 07/31/2013   Type 2 diabetes mellitus with hyperglycemia, without long-term current use of insulin ([place]) 11/16/2012    Madelyn Flavors, PT 10/08/2021, 12:21 PM  [facility] [address]  [place], [place], [address] Phone: [phone]   Fax:  [phone]  Name: Tommy Green MRN: [id number] Date of Birth: 19-Jul-1946

## 2021-10-13 ENCOUNTER — Ambulatory Visit: Payer: Medicare HMO | Admitting: Physical Therapy

## 2021-10-13 ENCOUNTER — Other Ambulatory Visit: Payer: Self-pay

## 2021-10-13 ENCOUNTER — Encounter: Payer: Self-pay | Admitting: Physical Therapy

## 2021-10-13 DIAGNOSIS — G8929 Other chronic pain: Secondary | ICD-10-CM | POA: Diagnosis not present

## 2021-10-13 DIAGNOSIS — M6281 Muscle weakness (generalized): Secondary | ICD-10-CM

## 2021-10-13 DIAGNOSIS — R2689 Other abnormalities of gait and mobility: Secondary | ICD-10-CM | POA: Diagnosis not present

## 2021-10-13 DIAGNOSIS — M545 Low back pain, unspecified: Secondary | ICD-10-CM

## 2021-10-13 DIAGNOSIS — I6930 Unspecified sequelae of cerebral infarction: Secondary | ICD-10-CM | POA: Diagnosis not present

## 2021-10-13 DIAGNOSIS — M21372 Foot drop, left foot: Secondary | ICD-10-CM | POA: Diagnosis not present

## 2021-10-13 DIAGNOSIS — R2681 Unsteadiness on feet: Secondary | ICD-10-CM

## 2021-10-13 NOTE — Therapy (Signed)
[facility] [address]  [place], [place], [address] Phone: [phone]   Fax:  [phone]  Physical Therapy Treatment  Patient Details  Name: Tommy Green MRN: [id number] Date of Birth: [birth year] Referring Provider (PT): Riki Sheer, [place]   Encounter Date: 10/13/2021   PT End of Session - 10/13/21 1450     Visit Number 2    Number of Visits 16    Date for PT Re-Evaluation 12/03/21    Authorization Type Aetna MCR    Progress Note Due on Visit 10    PT Start Time 4401    PT Stop Time 1530    PT Time Calculation (min) 45 min    Activity Tolerance Patient tolerated treatment well    Behavior During Therapy [facility] for tasks assessed/performed             Past Medical History:  Diagnosis Date   Anemia    Essential hypertension 07/18/2017   Foot drop, left foot    due to back surgery in 01/2021   History of colonic polyps    History of rheumatic fever    History of stroke 07/18/2017   Hypertriglyceridemia 07/18/2017   Type 2 diabetes mellitus with diabetic neuropathy, unspecified ([place]) 07/18/2017    Past Surgical History:  Procedure Laterality Date   COLONOSCOPY     around 2018 [facility]   COLONOSCOPY  09/02/2020   CRANIOTOMY Left 10/25/2019   Procedure: CRANIOTOMY HEMATOMA EVACUATION SUBDURAL;  Surgeon: Vallarie Mare, MD;  Location: [facility];  Service: Neurosurgery;  Laterality: Left;   ESOPHAGOGASTRODUODENOSCOPY     around 2018 with [facility]   FOOT CAPSULOTOMY Left 07/25/2008   Mid Foot #2 MPJ   Hammertoe Repair Left 07/25/2008   #2 toe   SPINAL FUSION  01/2021   TARSAL TUNNEL RELEASE Left 07/25/2008   UPPER GASTROINTESTINAL ENDOSCOPY  09/02/2020    There were no vitals filed for this visit.   Subjective Assessment - 10/13/21 1449     Subjective Patient reports no new concerns since IE.  Is wearing his AFO today.    Pertinent History T2DM, HTN, spinal fusion 4/22, heart  murmur, brain bleed Jan 2020, RA    How long can you walk comfortably? 10 min    Currently in Pain? Yes    Pain Score 6     Pain Location Knee    Pain Orientation Left    Pain Descriptors / Indicators Sore                               [facility] Adult PT Treatment/Exercise - 10/13/21 0001       Exercises   Exercises Knee/Hip      Knee/Hip Exercises: Aerobic   Nustep L4 x 6 min (LE only)      Knee/Hip Exercises: Standing   Functional Squat 10 reps    Functional Squat Limitations with UE support and bolster behind for cues and eccentric control      Knee/Hip Exercises: Supine   Bridges Strengthening;Both;2 sets;10 reps    Straight Leg Raises Strengthening;Both;2 sets;10 reps    Straight Leg Raises Limitations cues for quad set and control      Knee/Hip Exercises: Sidelying   Hip ABduction Strengthening;Both;2 sets;10 reps    Clams 2 x 10 bil no resistance      Modalities   Modalities Electrical Stimulation  Acupuncturist Location L anterior tibialis    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 [employer]    Electrical Stimulation Parameters 50%, 5 sec on/off, 2 s ramp    Electrical Stimulation Goals Neuromuscular facilitation                     PT Education - 10/13/21 1801     Education Details reviewed and progressed HEP    Person(s) Educated Patient;Spouse    Methods Explanation;Demonstration;Verbal cues;Handout    Comprehension Verbalized understanding;Returned demonstration              PT Short Term Goals - 10/13/21 1805       PT SHORT TERM GOAL #1   Title Ind and compliant with initial HEP    Time 4    Period Weeks    Status On-going   10/13/21- performed this morning.   Target Date 11/05/21      PT SHORT TERM GOAL #2   Title Improved 5x sit to stand to <= 15 sec    Time 4    Period Weeks    Status On-going      PT SHORT TERM GOAL #3   Title Improved TUG to <= 16 sec    Time 4     Period Weeks    Status On-going               PT Long Term Goals - 10/13/21 1806       PT LONG TERM GOAL #1   Title Ind and compliant with HEP to maintain therapy gains    Time 8    Period Weeks    Status On-going    Target Date 12/03/21      PT LONG TERM GOAL #2   Title Patient able to safely ambulate at home in community with least restrictive AD and a normal gait pattern including good heel strike bil (with or without AFO).    Time 8    Period Weeks    Status On-going    Target Date 12/03/21      PT LONG TERM GOAL #3   Title Patient to demo increased bil LE strength to allow him to don/doff his pants independently and apply pressure to gas/brake pedals to allow return to driving.    Time 8    Period Weeks    Status On-going    Target Date 12/03/21      PT LONG TERM GOAL #4   Title Patient to demonstrate improved 5x sit to stand to <= 12 sec and TUG to <= 10 sec (with rollator) to decrease risk of falls.    Time 8    Period Weeks    Status On-going    Target Date 12/03/21      PT LONG TERM GOAL #5   Title Patient able to get in/out of bed independently at home.    Time 8    Period Weeks    Status On-going    Target Date 12/03/21      PT LONG TERM GOAL #6   Title Patient able to demo increased LE strength by being able to ascend/descend his stairs at home with a reciprocal gait pattern using railing.    Time 8    Period Weeks    Status On-going    Target Date 12/03/21                   Plan - 10/13/21 1802  Clinical Impression Statement Patient reports no changes since IE.  He did wear his AFO but reports difficulty with donning and doffing.  Today reveiwed and progressed HEP focusing on supine and sidelying exercises today, noted significant weakness in bil hips.  Also trialed NMES for L ant tib atrophy (demonstrates only 1/5 L ant tib strength) - he tolerated well and were able to elicit trace contraction using NMES.  He would benefit from  NMES to strengthen L anterior tibialis to address drop foot and decrease risk of falls.  He would benefit from continued skilled therapy for strengthening and improve mobility.    Personal Factors and Comorbidities Comorbidity 3+;Time since onset of injury/illness/exacerbation;Behavior Pattern    Comorbidities T2DM, HTN, spinal fusion 4/22, heart murmur, brain bleed Jan 2020, RA, neuropathy    Examination-Activity Limitations Bathing;Bed Mobility;Locomotion Level;Dressing    Examination-Participation Restrictions Driving    Stability/Clinical Decision Making Stable/Uncomplicated    Rehab Potential Good    PT Frequency 2x / week    PT Duration 8 weeks    PT Treatment/Interventions ADLs/Self Care Home Management;Aquatic Therapy;Biofeedback;Cryotherapy;Electrical Stimulation;Moist Heat;Neuromuscular re-education;Balance training;Therapeutic exercise;Therapeutic activities;Functional mobility training;Stair training;Gait training;Patient/family education;Manual techniques;Dry needling;Passive range of motion;Taping    PT Next Visit Plan Review HEP and progress (add SKTC); LE strengthening (including ankles) using machines for greater resistance and cardio equipment; try NMES for left ant tib activation; balance/gait/stairs; see functional goals    PT Home Exercise Plan [email or web address]    Consulted and Agree with Plan of Care Patient;Family member/caregiver   wife, Fraser Din            Patient will benefit from skilled therapeutic intervention in order to improve the following deficits and impairments:  Abnormal gait, Decreased range of motion, Pain, Decreased activity tolerance, Decreased balance, Impaired flexibility, Decreased strength  Visit Diagnosis: Muscle weakness (generalized)  Foot drop, left  Unsteadiness on feet  Other abnormalities of gait and mobility  Chronic midline low back pain, unspecified whether sciatica present     Problem List Patient Active Problem List   Diagnosis Date  Noted   Foot drop, left foot 10/02/2021   History of colonic polyps 10/02/2021   History of rheumatic fever 10/02/2021   Stress fracture of left foot 09/03/2021   Gabapentin overdose, accidental or unintentional, initial encounter 03/08/2021   TIA (transient ischemic attack) 03/05/2021   Body mass index (BMI) 36.0-36.9, adult 10/03/2020   Subdural hemorrhage following injury without open intracranial wound and with prolonged loss of consciousness (more than 24 hours) without return to pre-existing conscious level ([place]) 10/03/2020   AKI (acute kidney injury) ([place])    Acute encephalopathy    SDH (subdural hematoma) 93/81/8299   Cyclic vomiting syndrome 08/06/2019   Neuropathy 12/25/2018   AK (actinic keratosis) 08/24/2018   Neoplasm of uncertain behavior 37/16/9678   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   Tendinopathy of right gluteus medius 07/13/2018   Tendinopathy of left gluteus medius 07/13/2018   Squamous cell carcinoma in situ (SCCIS) of skin 03/24/2018   Vitamin D deficiency 11/29/2017   Stage 3 chronic kidney disease ([place]) 10/28/2017   Primary osteoarthritis of right hip 09/11/2017   Essential hypertension 07/18/2017   Hyperlipidemia associated with type 2 diabetes mellitus ([place]) 07/18/2017   Type 2 diabetes mellitus with diabetic neuropathy, unspecified ([place]) 07/18/2017   Heart murmur 07/18/2017   Hypertriglyceridemia 07/18/2017   History of stroke 07/18/2017   DDD (degenerative disc disease), lumbar 06/15/2017   Iron deficiency anemia 06/15/2017   Stroke ([place]) 06/15/2017   Encounter for  hepatitis C screening test for low risk patient 06/30/2016   Microalbuminuria due to type 2 diabetes mellitus ([place]) 03/19/2016   Hypertrophy of inferior nasal turbinate 12/20/2015   Disease of nasal cavity and sinuses 12/10/2015   Degenerative tear of acetabular labrum of left hip 11/04/2015   History of tobacco abuse 05/15/2015   Morbid (severe) obesity due to excess calories ([place]) 04/11/2015    Disorder of both eustachian tubes 01/27/2015   Maxillary sinusitis 01/13/2015   Lipoprotein deficiency 01/31/2014   Gastro-esophageal reflux disease without esophagitis 11/02/2013   Rheumatic fever 11/02/2013   Erectile dysfunction associated with type 2 diabetes mellitus ([place]) 07/31/2013   Type 2 diabetes mellitus with hyperglycemia, without long-term current use of insulin ([place]) 11/16/2012    Rennie Natter, PT, DPT  10/13/2021, 6:07 PM  [facility] [address]  [place], [place], [address] Phone: [phone]   Fax:  [phone]  Name: Vivek Grealish Krass MRN: [id number] Date of Birth: 05/22/1946

## 2021-10-13 NOTE — Patient Instructions (Signed)
Access Code: [email or web address] URL: [email or web address] Date: 10/13/2021 Prepared by: Glenetta Hew  Exercises Supine Active Straight Leg Raise - 1 x daily - 7 x weekly - 2 sets - 10 reps Clamshell with Resistance - 1 x daily - 7 x weekly - 2 sets - 10 reps Sidelying Hip Abduction - 1 x daily - 7 x weekly - 2 sets - 10 reps

## 2021-10-15 ENCOUNTER — Other Ambulatory Visit: Payer: Self-pay

## 2021-10-15 ENCOUNTER — Ambulatory Visit: Payer: Medicare HMO

## 2021-10-15 DIAGNOSIS — R2689 Other abnormalities of gait and mobility: Secondary | ICD-10-CM | POA: Diagnosis not present

## 2021-10-15 DIAGNOSIS — M21372 Foot drop, left foot: Secondary | ICD-10-CM | POA: Diagnosis not present

## 2021-10-15 DIAGNOSIS — M6281 Muscle weakness (generalized): Secondary | ICD-10-CM

## 2021-10-15 DIAGNOSIS — G8929 Other chronic pain: Secondary | ICD-10-CM | POA: Diagnosis not present

## 2021-10-15 DIAGNOSIS — I6930 Unspecified sequelae of cerebral infarction: Secondary | ICD-10-CM | POA: Diagnosis not present

## 2021-10-15 DIAGNOSIS — R2681 Unsteadiness on feet: Secondary | ICD-10-CM | POA: Diagnosis not present

## 2021-10-15 DIAGNOSIS — M545 Low back pain, unspecified: Secondary | ICD-10-CM | POA: Diagnosis not present

## 2021-10-15 NOTE — Therapy (Signed)
[facility] [address]  [place], [place], [address] Phone: [phone]   Fax:  [phone]  Physical Therapy Treatment  Patient Details  Name: Tommy Green MRN: [id number] Date of Birth: [birth year] Referring Provider (PT): Riki Sheer, [place]   Encounter Date: 10/15/2021   PT End of Session - 10/15/21 1410     Visit Number 3    Number of Visits 16    Date for PT Re-Evaluation 12/03/21    Authorization Type [employer] MCR    Progress Note Due on Visit 10    PT Start Time 1315    PT Stop Time 1400    PT Time Calculation (min) 45 min    Activity Tolerance Patient tolerated treatment well;Patient limited by fatigue    Behavior During Therapy [facility] for tasks assessed/performed             Past Medical History:  Diagnosis Date   Anemia    Essential hypertension 07/18/2017   Foot drop, left foot    due to back surgery in 01/2021   History of colonic polyps    History of rheumatic fever    History of stroke 07/18/2017   Hypertriglyceridemia 07/18/2017   Type 2 diabetes mellitus with diabetic neuropathy, unspecified ([place]) 07/18/2017    Past Surgical History:  Procedure Laterality Date   COLONOSCOPY     around 2018 [facility]   COLONOSCOPY  09/02/2020   CRANIOTOMY Left 10/25/2019   Procedure: CRANIOTOMY HEMATOMA EVACUATION SUBDURAL;  Surgeon: Vallarie Mare, MD;  Location: [facility];  Service: Neurosurgery;  Laterality: Left;   ESOPHAGOGASTRODUODENOSCOPY     around 2018 with [facility]   FOOT CAPSULOTOMY Left 07/25/2008   Mid Foot #2 MPJ   Hammertoe Repair Left 07/25/2008   #2 toe   SPINAL FUSION  01/2021   TARSAL TUNNEL RELEASE Left 07/25/2008   UPPER GASTROINTESTINAL ENDOSCOPY  09/02/2020    There were no vitals filed for this visit.   Subjective Assessment - 10/15/21 1318     Subjective Pt reports that he was sore after the last session, no questions or concerns with HEP.    Pertinent  History T2DM, HTN, spinal fusion 4/22, heart murmur, brain bleed Jan 2020, RA    Currently in Pain? Yes    Pain Score 4     Pain Location Back    Pain Orientation Mid;Lower    Pain Descriptors / Indicators Sore    Pain Type Acute pain                               [facility] Adult PT Treatment/Exercise - 10/15/21 0001       Ambulation/Gait   Ambulation/Gait Yes    Ambulation/Gait Assistance 6: Modified independent (Device/Increase time)    Ambulation Distance (Feet) 220 Feet    Assistive device 4-wheeled walker    Gait Pattern Step-through pattern;Decreased dorsiflexion - right;Decreased dorsiflexion - left    Gait Comments cues to increase heel strike      Exercises   Exercises Knee/Hip      Knee/Hip Exercises: Stretches   Other Knee/Hip Stretches SKTC stretch 2x30 sec      Knee/Hip Exercises: Aerobic   Nustep L3 x 7 min (LE only)      Knee/Hip Exercises: Standing   Other Standing Knee Exercises L toe taps with B UE support 10x      Knee/Hip  Exercises: Supine   Other Supine Knee/Hip Exercises ball squeezes 10x2" with pelvic tilt    Other Supine Knee/Hip Exercises bent knee raises with yellow TB at knees 3x10      Ankle Exercises: Seated   Heel Raises Both;10 reps    Toe Raise 10 reps    Other Seated Ankle Exercises L ankle EV with yellow TB 12 reps                       PT Short Term Goals - 10/13/21 1805       PT SHORT TERM GOAL #1   Title Ind and compliant with initial HEP    Time 4    Period Weeks    Status On-going   10/13/21- performed this morning.   Target Date 11/05/21      PT SHORT TERM GOAL #2   Title Improved 5x sit to stand to <= 15 sec    Time 4    Period Weeks    Status On-going      PT SHORT TERM GOAL #3   Title Improved TUG to <= 16 sec    Time 4    Period Weeks    Status On-going               PT Long Term Goals - 10/13/21 1806       PT LONG TERM GOAL #1   Title Ind and compliant with HEP to  maintain therapy gains    Time 8    Period Weeks    Status On-going    Target Date 12/03/21      PT LONG TERM GOAL #2   Title Patient able to safely ambulate at home in community with least restrictive AD and a normal gait pattern including good heel strike bil (with or without AFO).    Time 8    Period Weeks    Status On-going    Target Date 12/03/21      PT LONG TERM GOAL #3   Title Patient to demo increased bil LE strength to allow him to don/doff his pants independently and apply pressure to gas/brake pedals to allow return to driving.    Time 8    Period Weeks    Status On-going    Target Date 12/03/21      PT LONG TERM GOAL #4   Title Patient to demonstrate improved 5x sit to stand to <= 12 sec and TUG to <= 10 sec (with rollator) to decrease risk of falls.    Time 8    Period Weeks    Status On-going    Target Date 12/03/21      PT LONG TERM GOAL #5   Title Patient able to get in/out of bed independently at home.    Time 8    Period Weeks    Status On-going    Target Date 12/03/21      PT LONG TERM GOAL #6   Title Patient able to demo increased LE strength by being able to ascend/descend his stairs at home with a reciprocal gait pattern using railing.    Time 8    Period Weeks    Status On-going    Target Date 12/03/21                   Plan - 10/15/21 1411     Clinical Impression Statement Progressed gait training and exercises to work on improving foot clearance. He  showed low endurance with the LE exercises today. Postural cues were needed in hooklying to keep the back flat. Cues during gait needed to improve foot clearance and for posture as well. He tolerated the exercises well today, needs more work on LE strengthening for endurance.    Personal Factors and Comorbidities Comorbidity 3+;Time since onset of injury/illness/exacerbation;Behavior Pattern    Comorbidities T2DM, HTN, spinal fusion 4/22, heart murmur, brain bleed Jan 2020, RA, neuropathy     Examination-Activity Limitations Bathing;Bed Mobility;Locomotion Level;Dressing    Examination-Participation Restrictions Driving    PT Frequency 2x / week    PT Duration 8 weeks    PT Treatment/Interventions ADLs/Self Care Home Management;Aquatic Therapy;Biofeedback;Cryotherapy;Electrical Stimulation;Moist Heat;Neuromuscular re-education;Balance training;Therapeutic exercise;Therapeutic activities;Functional mobility training;Stair training;Gait training;Patient/family education;Manual techniques;Dry needling;Passive range of motion;Taping    PT Next Visit Plan Review HEP and progress (add SKTC); LE strengthening (including ankles) using machines for greater resistance and cardio equipment; try NMES for left ant tib activation; balance/gait/stairs; see functional goals    PT Home Exercise Plan [email or web address]    Consulted and Agree with Plan of Care Patient;Family member/caregiver   wife, Fraser Din            Patient will benefit from skilled therapeutic intervention in order to improve the following deficits and impairments:  Abnormal gait, Decreased range of motion, Pain, Decreased activity tolerance, Decreased balance, Impaired flexibility, Decreased strength  Visit Diagnosis: Muscle weakness (generalized)  Foot drop, left  Unsteadiness on feet  Other abnormalities of gait and mobility  Chronic midline low back pain, unspecified whether sciatica present     Problem List Patient Active Problem List   Diagnosis Date Noted   Foot drop, left foot 10/02/2021   History of colonic polyps 10/02/2021   History of rheumatic fever 10/02/2021   Stress fracture of left foot 09/03/2021   Gabapentin overdose, accidental or unintentional, initial encounter 03/08/2021   TIA (transient ischemic attack) 03/05/2021   Body mass index (BMI) 36.0-36.9, adult 10/03/2020   Subdural hemorrhage following injury without open intracranial wound and with prolonged loss of consciousness (more than 24 hours)  without return to pre-existing conscious level ([place]) 10/03/2020   AKI (acute kidney injury) ([place])    Acute encephalopathy    SDH (subdural hematoma) 37/07/6268   Cyclic vomiting syndrome 08/06/2019   Neuropathy 12/25/2018   AK (actinic keratosis) 08/24/2018   Neoplasm of uncertain behavior 48/54/6270   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   Tendinopathy of right gluteus medius 07/13/2018   Tendinopathy of left gluteus medius 07/13/2018   Squamous cell carcinoma in situ (SCCIS) of skin 03/24/2018   Vitamin D deficiency 11/29/2017   Stage 3 chronic kidney disease ([place]) 10/28/2017   Primary osteoarthritis of right hip 09/11/2017   Essential hypertension 07/18/2017   Hyperlipidemia associated with type 2 diabetes mellitus ([place]) 07/18/2017   Type 2 diabetes mellitus with diabetic neuropathy, unspecified ([place]) 07/18/2017   Heart murmur 07/18/2017   Hypertriglyceridemia 07/18/2017   History of stroke 07/18/2017   DDD (degenerative disc disease), lumbar 06/15/2017   Iron deficiency anemia 06/15/2017   Stroke ([place]) 06/15/2017   Encounter for hepatitis C screening test for low risk patient 06/30/2016   Microalbuminuria due to type 2 diabetes mellitus ([place]) 03/19/2016   Hypertrophy of inferior nasal turbinate 12/20/2015   Disease of nasal cavity and sinuses 12/10/2015   Degenerative tear of acetabular labrum of left hip 11/04/2015   History of tobacco abuse 05/15/2015   Morbid (severe) obesity due to excess calories ([place]) 04/11/2015   Disorder of  both eustachian tubes 01/27/2015   Maxillary sinusitis 01/13/2015   Lipoprotein deficiency 01/31/2014   Gastro-esophageal reflux disease without esophagitis 11/02/2013   Rheumatic fever 11/02/2013   Erectile dysfunction associated with type 2 diabetes mellitus ([place]) 07/31/2013   Type 2 diabetes mellitus with hyperglycemia, without long-term current use of insulin ([place]) 11/16/2012    Artist Pais, PTA 10/15/2021, 2:22 PM  [facility] [address]  [place], [place], [address] Phone: [phone]   Fax:  [phone]  Name: Tommy Green MRN: [id number] Date of Birth: 03-Feb-1946

## 2021-10-18 NOTE — Progress Notes (Signed)
Cardiology Office Note:    Date:  10/20/2021   ID:  Tommy Green, DOB Sep 08, 1946, MRN [id number]  PCP:  [name], DO  Cardiologist:  Shirlee More, MD   Referring MD: [name]*  ASSESSMENT:    1. Nonrheumatic aortic valve stenosis   2. Mixed hyperlipidemia   3. Statin intolerance   4. Essential hypertension   5. Type 2 diabetes mellitus with diabetic neuropathy, with long-term current use of insulin (HCC)   6. Stage 3a chronic kidney disease ([place])   7. Anemia due to stage 3a chronic kidney disease ([place])   8. Iron deficiency anemia, unspecified iron deficiency anemia type    PLAN:    In order of problems listed above:  Clinically Montez has severe asymptomatic aortic stenosis and I think it contributed to his postoperative hypotension.  They are both extremely reluctant to consider interventions and I agree with him.  I would like to recheck an echocardiogram as the gradients and valve area dependent on preload blood volume blood count as well as blood pressure and at the time of the echocardiogram he is hypovolemic anemic and was on pressors.  I will plan to see back in the office in 6 months if symptomatic he need to make a decision if he wanted to pursue TAVR. Continue current treatment they stopped taking Zetia because of cost I told his wife that he could take one half daily with 85% of the efficacy and would help with his triglycerides in addition to prescription fish oil and fenofibrate.  He is statin intolerant and with his weakness I would not resume 1 Stable continue current treatment amlodipine and lisinopril Stable diabetes managed by his PCP insulin requiring Stable CKD Stable anemia not uncommon aortic stenosis  Next appointment 6 months unless he has a peak velocity greater than 5 m/s squared or reduced ejection fraction there is no indication for intervention at this time with asymptomatic severe aortic stenosis   Medication  Adjustments/Labs and Tests Ordered: Current medicines are reviewed at length with the patient today.  Concerns regarding medicines are outlined above.  Orders Placed This Encounter  Procedures   ECHOCARDIOGRAM COMPLETE   No orders of the defined types were placed in this encounter.    Chief Complaint  Patient presents with   Aortic Stenosis   Hyperlipidemia  We were told to be seen by cardiologist following hospitalization regarding his aortic valve disease  History of Present Illness:    Tommy Green is a 76 y.o. male who is being seen today for the evaluation of aortic stenosis noted at time of spine surgery with post operative hypotension requiring ICU care levophed  IVF and transfusion and AKI at the request of [name]*.  Echo 02/03/2021 [facility]: The aortic valve is calcified with reduced excursion.  Left ventricular systolic function is normal.  Mild left ventricular hypertrophy  LV ejection fraction = 55-60%.  There is moderate to severe aortic stenosis.  Aortic valve mean pressure gradient is 36 mmHg.  The calculated aortic valve area using the continuity equation is 0.85  cm2.  VTI ratio 0.24   low SVI 29.3 ml/m2, low flow low gradient severe AS There is no pericardial effusion.   His wife is present participates in the evaluation and medical decision making. She tells me he was seen by cardiologist while in the hospital and they wanted to bring him back quickly and recheck an echocardiogram but she made a good decision to let  him physically recover from his surgery before addressing the problem aortic stenosis He tells me he has had a lifetime heart murmur. He has never had any previous evaluation. He is quite limited in his activities but he has had no chest pain edema shortness of breath palpitation or syncope. They are both very hesitant to consider cardiac interventions. He has no history of congenital or rheumatic heart disease  Past Medical  History:  Diagnosis Date   Anemia    Essential hypertension 07/18/2017   Foot drop, left foot    due to back surgery in 01/2021   History of colonic polyps    History of rheumatic fever    History of stroke 07/18/2017   Hypertriglyceridemia 07/18/2017   Type 2 diabetes mellitus with diabetic neuropathy, unspecified ([place]) 07/18/2017    Past Surgical History:  Procedure Laterality Date   COLONOSCOPY     around 2018 [facility]   COLONOSCOPY  09/02/2020   CRANIOTOMY Left 10/25/2019   Procedure: CRANIOTOMY HEMATOMA EVACUATION SUBDURAL;  Surgeon: Vallarie Mare, MD;  Location: [facility];  Service: Neurosurgery;  Laterality: Left;   ESOPHAGOGASTRODUODENOSCOPY     around 2018 with [facility]   FOOT CAPSULOTOMY Left 07/25/2008   Mid Foot #2 MPJ   Hammertoe Repair Left 07/25/2008   #2 toe   SPINAL FUSION  01/2021   TARSAL TUNNEL RELEASE Left 07/25/2008   UPPER GASTROINTESTINAL ENDOSCOPY  09/02/2020    Current Medications: Current Meds  Medication Sig   acetaminophen (TYLENOL) 325 MG tablet Take 650 mg by mouth every 6 (six) hours as needed (for pain).   amLODipine (NORVASC) 5 MG tablet TAKE 1 TABLET DAILY   B Complex Vitamins (B COMPLEX PO) Take 1 tablet by mouth daily.   carvedilol (COREG) 12.5 MG tablet TAKE 1 TABLET TWICE DAILY  WITH MEALS   Cholecalciferol (VITAMIN D3 SUPER STRENGTH) 50 MCG (2000 UT) CAPS Take 2,000 Units by mouth daily.   Etanercept (ENBREL MINI) 50 MG/ML SOCT Inject 50 mg into the skin once a week.   fenofibrate micronized (LOFIBRA) 200 MG capsule TAKE 1 CAPSULE DAILY BEFOREBREAKFAST   gabapentin (NEURONTIN) 100 MG capsule TAKE 2 CAPSULES AT BEDTIME   insulin degludec (TRESIBA) 100 UNIT/ML FlexTouch Pen Inject 30 Units into the skin at bedtime.   insulin lispro (HUMALOG) 100 UNIT/ML injection Inject 0-10 Units into the skin See admin instructions. Inject 0-10 units into the skin before meals, PER SLIDING SCALE: BGL 0-200 = give nothing, 201-250 = 2 units,  251-300 = 4 units, 301-350 = 6 units, 351-400 = 8 units, >400 = 10 units, push fluids, and repeat BGL check in 2 hours. If BGL remains >400 after 2 hours, CALL MD.   leflunomide (ARAVA) 10 MG tablet TAKE 1 TABLET BY MOUTH EVERY DAY   lisinopril (ZESTRIL) 20 MG tablet Take 1 tablet (20 mg total) by mouth daily.   Magnesium 200 MG TABS Take 200 mg by mouth daily in the afternoon.   Multiple Vitamins-Minerals (MULTIVITAMIN WITH MINERALS) tablet Take 1 tablet by mouth daily.   omega-3 acid ethyl esters (LOVAZA) 1 g capsule Take 1 capsule (1 g total) by mouth 2 (two) times daily.   omeprazole (PRILOSEC) 20 MG capsule Take 1 capsule (20 mg total) by mouth daily before breakfast.   ONETOUCH VERIO test strip 4 (four) times daily.   Potassium 99 MG TABS Take 99 mg by mouth daily.   vitamin B-12 (CYANOCOBALAMIN) 1000 MCG tablet Take 1,000 mcg by mouth  daily.     Allergies:   Fluzone quadrivalent [influenza vac split quad], Influenza vaccines, Aspirin, Atorvastatin, Canagliflozin, and Statins   Social History   Socioeconomic History   Marital status: Married    Spouse name: Not on file   Number of children: Not on file   Years of education: Not on file   Highest education level: Not on file  Occupational History   Not on file  Tobacco Use   Smoking status: Former    Years: 10.00    Types: Cigarettes    Quit date: 23    Years since quitting: 50.0   Smokeless tobacco: Never  Vaping Use   Vaping Use: Never used  Substance and Sexual Activity   Alcohol use: Yes    Comment: occ beer   Drug use: No   Sexual activity: Yes  Other Topics Concern   Not on file  Social History Narrative   Not on file   Social Determinants of Health   Financial Resource Strain: Not on file  Food Insecurity: Not on file  Transportation Needs: Not on file  Physical Activity: Not on file  Stress: Not on file  Social Connections: Not on file     Family History: The patient's family history includes  Alcoholism in his brother; Colon polyps in his mother; Healthy in his son and son; Hyperlipidemia in his father and mother. There is no history of Cancer, Colon cancer, Esophageal cancer, Rectal cancer, or Stomach cancer.  ROS:   ROS Please see the history of present illness.   His predominant problem now is ambulation uses a walker and has a foot drop.  He also complains of dry mouth that he attributes to gabapentin  All other systems reviewed and are negative.  EKGs/Labs/Other Studies Reviewed:    The following studies were reviewed today:   EKG: EKG in hospital 01/26/2021 showed sinus arrhythmia occasional PVCs nonspecific T waves  Recent Labs: 03/05/2021: Magnesium 1.9 06/16/2021: ALT 9; BUN 26; Creat 1.50; Hemoglobin 10.5; Platelets 312; Potassium 4.8; Sodium 137  Recent Lipid Panel    Component Value Date/Time   CHOL 187 05/04/2021 0852   TRIG 315.0 (H) 05/04/2021 0852   HDL 28.10 (L) 05/04/2021 0852   CHOLHDL 7 05/04/2021 0852   VLDL 63.0 (H) 05/04/2021 0852   LDLCALC 79 03/06/2021 0413   LDLDIRECT 107.0 05/04/2021 0852    Physical Exam:    VS:  BP 134/69 (BP Location: Right Arm, Patient Position: Sitting, Cuff Size: Normal)    Pulse 72    Ht 5' 10.5" (1.791 m)    Wt 231 lb 6.4 oz (105 kg)    SpO2 97%    BMI 32.73 kg/m     Wt Readings from Last 3 Encounters:  10/20/21 231 lb 6.4 oz (105 kg)  09/17/21 225 lb (102.1 kg)  09/03/21 230 lb (104.3 kg)     GEN: He appears his age he looks somewhat debilitated well nourished, well developed in no acute distress HEENT: Normal NECK: No JVD; No carotid bruits LYMPHATICS: No lymphadenopathy CARDIAC: As a harsh grunting grade 3/6 murmur throughout the precordium radiates to the carotids bilaterally S2 is single no aortic regurgitation, in summary consistent with severe aortic stenosis RRRRESPIRATORY:  Clear to auscultation without rales, wheezing or rhonchi  ABDOMEN: Soft, non-tender, non-distended MUSCULOSKELETAL:  No edema; No  deformity  SKIN: Warm and dry NEUROLOGIC:  Alert and oriented x 3 PSYCHIATRIC:  Normal affect     Signed, Shirlee More, MD  10/20/2021 9:50 AM    [facility]

## 2021-10-20 ENCOUNTER — Encounter: Payer: Self-pay | Admitting: Cardiology

## 2021-10-20 ENCOUNTER — Ambulatory Visit: Payer: Medicare HMO | Admitting: Family Medicine

## 2021-10-20 ENCOUNTER — Other Ambulatory Visit: Payer: Self-pay | Admitting: Family Medicine

## 2021-10-20 ENCOUNTER — Ambulatory Visit: Payer: Medicare HMO | Admitting: Cardiology

## 2021-10-20 ENCOUNTER — Other Ambulatory Visit: Payer: Self-pay

## 2021-10-20 VITALS — BP 134/69 | HR 72 | Ht 70.5 in | Wt 231.4 lb

## 2021-10-20 DIAGNOSIS — N1831 Chronic kidney disease, stage 3a: Secondary | ICD-10-CM | POA: Diagnosis not present

## 2021-10-20 DIAGNOSIS — Z794 Long term (current) use of insulin: Secondary | ICD-10-CM | POA: Diagnosis not present

## 2021-10-20 DIAGNOSIS — I350 Nonrheumatic aortic (valve) stenosis: Secondary | ICD-10-CM | POA: Diagnosis not present

## 2021-10-20 DIAGNOSIS — E1140 Type 2 diabetes mellitus with diabetic neuropathy, unspecified: Secondary | ICD-10-CM | POA: Diagnosis not present

## 2021-10-20 DIAGNOSIS — I10 Essential (primary) hypertension: Secondary | ICD-10-CM

## 2021-10-20 DIAGNOSIS — Z789 Other specified health status: Secondary | ICD-10-CM | POA: Diagnosis not present

## 2021-10-20 DIAGNOSIS — D509 Iron deficiency anemia, unspecified: Secondary | ICD-10-CM

## 2021-10-20 DIAGNOSIS — D631 Anemia in chronic kidney disease: Secondary | ICD-10-CM | POA: Diagnosis not present

## 2021-10-20 DIAGNOSIS — E782 Mixed hyperlipidemia: Secondary | ICD-10-CM | POA: Diagnosis not present

## 2021-10-20 NOTE — Patient Instructions (Signed)
Medication Instructions:  Your physician recommends that you continue on your current medications as directed. Please refer to the Current Medication list given to you today.  *If you need a refill on your cardiac medications before your next appointment, please call your pharmacy*   Lab Work: None If you have labs (blood work) drawn today and your tests are completely normal, you will receive your results only by: . MyChart Message (if you have MyChart) OR . A paper copy in the mail If you have any lab test that is abnormal or we need to change your treatment, we will call you to review the results.   Testing/Procedures: Your physician has requested that you have an echocardiogram. Echocardiography is a painless test that uses sound waves to create images of your heart. It provides your doctor with information about the size and shape of your heart and how well your heart's chambers and valves are working. This procedure takes approximately one hour. There are no restrictions for this procedure.     Follow-Up: At [facility], you and your health needs are our priority.  As part of our continuing mission to provide you with exceptional heart care, we have created designated Provider Care Teams.  These Care Teams include your primary Cardiologist (physician) and Advanced Practice Providers (APPs -  Physician Assistants and Nurse Practitioners) who all work together to provide you with the care you need, when you need it.  We recommend signing up for the patient portal called "MyChart".  Sign up information is provided on this After Visit Summary.  MyChart is used to connect with patients for Virtual Visits (Telemedicine).  Patients are able to view lab/test results, encounter notes, upcoming appointments, etc.  Non-urgent messages can be sent to your provider as well.   To learn more about what you can do with MyChart, go to [email or web address].    Your next appointment:   6  month(s)  The format for your next appointment:   In Person  Provider:   Brian Munley, MD   Other Instructions   

## 2021-10-21 ENCOUNTER — Ambulatory Visit (HOSPITAL_BASED_OUTPATIENT_CLINIC_OR_DEPARTMENT_OTHER)
Admission: RE | Admit: 2021-10-21 | Discharge: 2021-10-21 | Disposition: A | Discharge status: Home / Self Care | Payer: Medicare HMO | Source: Ambulatory Visit | Attending: Cardiology | Admitting: Cardiology

## 2021-10-21 DIAGNOSIS — I350 Nonrheumatic aortic (valve) stenosis: Secondary | ICD-10-CM | POA: Insufficient documentation

## 2021-10-21 LAB — ECHOCARDIOGRAM COMPLETE
AR max vel: 0.71 cm2
AV Area VTI: 0.75 cm2
AV Area mean vel: 0.6 cm2
AV Mean grad: 44.8 mmHg
AV Peak grad: 67.8 mmHg
Ao pk vel: 4.12 m/s
Area-P 1/2: 2.15 cm2
Calc EF: 52.4 %
MV VTI: 2.07 cm2
S' Lateral: 4.1 cm
Single Plane A2C EF: 52.3 %
Single Plane A4C EF: 51.2 %

## 2021-10-22 ENCOUNTER — Other Ambulatory Visit: Payer: Self-pay

## 2021-10-22 ENCOUNTER — Ambulatory Visit: Payer: Medicare HMO | Attending: Family Medicine | Admitting: Physical Therapy

## 2021-10-22 ENCOUNTER — Encounter: Payer: Self-pay | Admitting: Physical Therapy

## 2021-10-22 ENCOUNTER — Telehealth: Payer: Self-pay

## 2021-10-22 DIAGNOSIS — M6281 Muscle weakness (generalized): Secondary | ICD-10-CM | POA: Insufficient documentation

## 2021-10-22 DIAGNOSIS — M21372 Foot drop, left foot: Secondary | ICD-10-CM | POA: Diagnosis not present

## 2021-10-22 DIAGNOSIS — R2681 Unsteadiness on feet: Secondary | ICD-10-CM | POA: Diagnosis not present

## 2021-10-22 DIAGNOSIS — R2689 Other abnormalities of gait and mobility: Secondary | ICD-10-CM | POA: Diagnosis not present

## 2021-10-22 DIAGNOSIS — G8929 Other chronic pain: Secondary | ICD-10-CM | POA: Insufficient documentation

## 2021-10-22 DIAGNOSIS — M545 Low back pain, unspecified: Secondary | ICD-10-CM | POA: Insufficient documentation

## 2021-10-22 NOTE — Telephone Encounter (Signed)
-----   Message from [name], MD sent at 10/22/2021  8:17 AM EST ----- As we suspected his valve blockage, aortic stenosis is severe  He is asymptomatic  I would not advise any complicated cardiac intervention and we can discuss further at his next visit

## 2021-10-22 NOTE — Therapy (Signed)
[facility] [address]  [place], [place], [address] Phone: [phone]   Fax:  [phone]  Physical Therapy Treatment  Patient Details  Name: Tommy Green MRN: [id number] Date of Birth: June 04, 1946 Referring Provider (PT): Riki Sheer, [place]   Encounter Date: 10/22/2021   PT End of Session - 10/22/21 1626     Visit Number 4    Number of Visits 16    Date for PT Re-Evaluation 12/03/21    Authorization Type [employer] MCR    Progress Note Due on Visit 10    PT Start Time 1620    PT Stop Time 1705    PT Time Calculation (min) 45 min    Activity Tolerance Patient tolerated treatment well;Patient limited by fatigue    Behavior During Therapy [facility] for tasks assessed/performed             Past Medical History:  Diagnosis Date   Anemia    Essential hypertension 07/18/2017   Foot drop, left foot    due to back surgery in 01/2021   History of colonic polyps    History of rheumatic fever    History of stroke 07/18/2017   Hypertriglyceridemia 07/18/2017   Type 2 diabetes mellitus with diabetic neuropathy, unspecified ([place]) 07/18/2017    Past Surgical History:  Procedure Laterality Date   COLONOSCOPY     around 2018 [facility]   COLONOSCOPY  09/02/2020   CRANIOTOMY Left 10/25/2019   Procedure: CRANIOTOMY HEMATOMA EVACUATION SUBDURAL;  Surgeon: Vallarie Mare, MD;  Location: [place];  Service: Neurosurgery;  Laterality: Left;   ESOPHAGOGASTRODUODENOSCOPY     around 2018 with [facility]   FOOT CAPSULOTOMY Left 07/25/2008   Mid Foot #2 MPJ   Hammertoe Repair Left 07/25/2008   #2 toe   SPINAL FUSION  01/2021   TARSAL TUNNEL RELEASE Left 07/25/2008   UPPER GASTROINTESTINAL ENDOSCOPY  09/02/2020    There were no vitals filed for this visit.   Subjective Assessment - 10/22/21 1625     Subjective Pt. reports he was not sore after last session, denies falls, getting easier to get in and out of house  now has another bracket to hold onto.    Pertinent History T2DM, HTN, spinal fusion 4/22, heart murmur, brain bleed Jan 2020, RA    Currently in Pain? No/denies                               [facility] Adult PT Treatment/Exercise - 10/22/21 0001       Ambulation/Gait   Ambulation/Gait Yes    Ambulation/Gait Assistance 5: Supervision    Ambulation/Gait Assistance Details close SBA due to fatigue as progressed.    Ambulation Distance (Feet) 270 Feet   180 ft + 90 ft after rest break.   Assistive device 4-wheeled walker    Gait Pattern Step-through pattern;Decreased dorsiflexion - right;Decreased dorsiflexion - left    Gait Comments cues to increase heel strike      Exercises   Exercises Knee/Hip      Knee/Hip Exercises: Aerobic   Nustep L5 x 8 min      Knee/Hip Exercises: Machines for Strengthening   Cybex Knee Flexion 10# 1 x 15, 15# 2 x 10      Knee/Hip Exercises: Standing   Heel Raises Both;10 reps    Functional Squat 2 sets;10 reps    Functional Squat Limitations  with UE support and bolster behind for cues and eccentric control      Knee/Hip Exercises: Seated   Long Arc Quad Strengthening;Both;10 reps    Long Arc Quad Limitations cues for eccentric control and quad set    Other Seated Knee/Hip Exercises seated heel raises 2 x 10      Ankle Exercises: Seated   Toe Raise 10 reps                       PT Short Term Goals - 10/13/21 1805       PT SHORT TERM GOAL #1   Title Ind and compliant with initial HEP    Time 4    Period Weeks    Status On-going   10/13/21- performed this morning.   Target Date 11/05/21      PT SHORT TERM GOAL #2   Title Improved 5x sit to stand to <= 15 sec    Time 4    Period Weeks    Status On-going      PT SHORT TERM GOAL #3   Title Improved TUG to <= 16 sec    Time 4    Period Weeks    Status On-going               PT Long Term Goals - 10/13/21 1806       PT LONG TERM GOAL #1   Title Ind  and compliant with HEP to maintain therapy gains    Time 8    Period Weeks    Status On-going    Target Date 12/03/21      PT LONG TERM GOAL #2   Title Patient able to safely ambulate at home in community with least restrictive AD and a normal gait pattern including good heel strike bil (with or without AFO).    Time 8    Period Weeks    Status On-going    Target Date 12/03/21      PT LONG TERM GOAL #3   Title Patient to demo increased bil LE strength to allow him to don/doff his pants independently and apply pressure to gas/brake pedals to allow return to driving.    Time 8    Period Weeks    Status On-going    Target Date 12/03/21      PT LONG TERM GOAL #4   Title Patient to demonstrate improved 5x sit to stand to <= 12 sec and TUG to <= 10 sec (with rollator) to decrease risk of falls.    Time 8    Period Weeks    Status On-going    Target Date 12/03/21      PT LONG TERM GOAL #5   Title Patient able to get in/out of bed independently at home.    Time 8    Period Weeks    Status On-going    Target Date 12/03/21      PT LONG TERM GOAL #6   Title Patient able to demo increased LE strength by being able to ascend/descend his stairs at home with a reciprocal gait pattern using railing.    Time 8    Period Weeks    Status On-going    Target Date 12/03/21                   Plan - 10/22/21 1709     Clinical Impression Statement Focused session on LE strengthening and endurance, with more standing and  walking exercises.  He fatigued quickly and needed multiple seated rest breaks for muscle recovery.  He was able to perform HS curls with 15# resistance today.   Discussed him using the quad cane at home, prefer continue to use walker as he builds his strength due to risk of falls.    Personal Factors and Comorbidities Comorbidity 3+;Time since onset of injury/illness/exacerbation;Behavior Pattern    Comorbidities T2DM, HTN, spinal fusion 4/22, heart murmur, brain bleed  Jan 2020, RA, neuropathy    Examination-Activity Limitations Bathing;Bed Mobility;Locomotion Level;Dressing    Examination-Participation Restrictions Driving    PT Frequency 2x / week    PT Duration 8 weeks    PT Treatment/Interventions ADLs/Self Care Home Management;Aquatic Therapy;Biofeedback;Cryotherapy;Electrical Stimulation;Moist Heat;Neuromuscular re-education;Balance training;Therapeutic exercise;Therapeutic activities;Functional mobility training;Stair training;Gait training;Patient/family education;Manual techniques;Dry needling;Passive range of motion;Taping    PT Next Visit Plan Review HEP and progress (add SKTC); LE strengthening (including ankles) using machines for greater resistance and cardio equipment; try NMES for left ant tib activation; balance/gait/stairs; see functional goals    PT Home Exercise Plan [email or web address]    Consulted and Agree with Plan of Care Patient;Family member/caregiver   wife, Fraser Din            Patient will benefit from skilled therapeutic intervention in order to improve the following deficits and impairments:  Abnormal gait, Decreased range of motion, Pain, Decreased activity tolerance, Decreased balance, Impaired flexibility, Decreased strength  Visit Diagnosis: Muscle weakness (generalized)  Foot drop, left  Unsteadiness on feet  Other abnormalities of gait and mobility     Problem List Patient Active Problem List   Diagnosis Date Noted   Foot drop, left foot 10/02/2021   History of colonic polyps 10/02/2021   History of rheumatic fever 10/02/2021   Stress fracture of left foot 09/03/2021   Gabapentin overdose, accidental or unintentional, initial encounter 03/08/2021   TIA (transient ischemic attack) 03/05/2021   Body mass index (BMI) 36.0-36.9, adult 10/03/2020   Subdural hemorrhage following injury without open intracranial wound and with prolonged loss of consciousness (more than 24 hours) without return to pre-existing conscious level  ([place]) 10/03/2020   AKI (acute kidney injury) ([place])    Acute encephalopathy    SDH (subdural hematoma) 49/44/9675   Cyclic vomiting syndrome 08/06/2019   Neuropathy 12/25/2018   AK (actinic keratosis) 08/24/2018   Neoplasm of uncertain behavior 91/63/8466   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   Tendinopathy of right gluteus medius 07/13/2018   Tendinopathy of left gluteus medius 07/13/2018   Squamous cell carcinoma in situ (SCCIS) of skin 03/24/2018   Vitamin D deficiency 11/29/2017   Stage 3 chronic kidney disease ([place]) 10/28/2017   Primary osteoarthritis of right hip 09/11/2017   Essential hypertension 07/18/2017   Hyperlipidemia associated with type 2 diabetes mellitus ([place]) 07/18/2017   Type 2 diabetes mellitus with diabetic neuropathy, unspecified ([place]) 07/18/2017   Heart murmur 07/18/2017   Hypertriglyceridemia 07/18/2017   History of stroke 07/18/2017   DDD (degenerative disc disease), lumbar 06/15/2017   Iron deficiency anemia 06/15/2017   Stroke ([place]) 06/15/2017   Encounter for hepatitis C screening test for low risk patient 06/30/2016   Microalbuminuria due to type 2 diabetes mellitus ([place]) 03/19/2016   Hypertrophy of inferior nasal turbinate 12/20/2015   Disease of nasal cavity and sinuses 12/10/2015   Degenerative tear of acetabular labrum of left hip 11/04/2015   History of tobacco abuse 05/15/2015   Morbid (severe) obesity due to excess calories ([place]) 04/11/2015   Disorder of both eustachian tubes 01/27/2015  Maxillary sinusitis 01/13/2015   Lipoprotein deficiency 01/31/2014   Gastro-esophageal reflux disease without esophagitis 11/02/2013   Rheumatic fever 11/02/2013   Erectile dysfunction associated with type 2 diabetes mellitus ([place]) 07/31/2013   Type 2 diabetes mellitus with hyperglycemia, without long-term current use of insulin ([place]) 11/16/2012    Rennie Natter, PT, DPT  10/22/2021, [facility] [address]  [place], [place], [address] Phone: [phone]   Fax:  [phone]  Name: Tommy Green MRN: [id number] Date of Birth: Nov 30, 1945

## 2021-10-22 NOTE — Telephone Encounter (Signed)
Spoke with patient regarding results and recommendation.  Patient verbalizes understanding and is agreeable to plan of care. Advised patient to call back with any issues or concerns.  

## 2021-10-26 ENCOUNTER — Telehealth: Payer: Self-pay | Admitting: Family Medicine

## 2021-10-26 DIAGNOSIS — M21372 Foot drop, left foot: Secondary | ICD-10-CM | POA: Diagnosis not present

## 2021-10-26 DIAGNOSIS — Z6835 Body mass index (BMI) 35.0-35.9, adult: Secondary | ICD-10-CM | POA: Diagnosis not present

## 2021-10-26 DIAGNOSIS — M4325 Fusion of spine, thoracolumbar region: Secondary | ICD-10-CM | POA: Diagnosis not present

## 2021-10-26 DIAGNOSIS — I10 Essential (primary) hypertension: Secondary | ICD-10-CM | POA: Diagnosis not present

## 2021-10-26 MED ORDER — AMLODIPINE BESYLATE 5 MG PO TABS: 5.0000 mg | ORAL_TABLET | Freq: Every day | ORAL | 3 refills | Status: DC

## 2021-10-26 NOTE — Telephone Encounter (Signed)
Refill done.  

## 2021-10-26 NOTE — Telephone Encounter (Signed)
Medication: amLODipine (NORVASC) 5 MG tablet  Has the patient contacted their pharmacy? Yes.   (If no, request that the patient contact the pharmacy for the refill.) (If yes, when and what did the pharmacy advise?)  Preferred Pharmacy (with phone number or street name):  [facility] [place] [facility] [address]  [place], [place] [address]  Phone:  [phone]  Fax:  [phone]   Agent: Please be advised that RX refills may take up to 3 business days. We ask that you follow-up with your pharmacy.

## 2021-10-27 ENCOUNTER — Ambulatory Visit: Payer: Medicare HMO

## 2021-10-27 ENCOUNTER — Other Ambulatory Visit: Payer: Self-pay

## 2021-10-27 DIAGNOSIS — M545 Low back pain, unspecified: Secondary | ICD-10-CM

## 2021-10-27 DIAGNOSIS — M6281 Muscle weakness (generalized): Secondary | ICD-10-CM | POA: Diagnosis not present

## 2021-10-27 DIAGNOSIS — G8929 Other chronic pain: Secondary | ICD-10-CM | POA: Diagnosis not present

## 2021-10-27 DIAGNOSIS — R2689 Other abnormalities of gait and mobility: Secondary | ICD-10-CM

## 2021-10-27 DIAGNOSIS — M21372 Foot drop, left foot: Secondary | ICD-10-CM

## 2021-10-27 DIAGNOSIS — R2681 Unsteadiness on feet: Secondary | ICD-10-CM

## 2021-10-27 NOTE — Therapy (Signed)
[facility] [address]  [place], [place], [address] Phone: [phone]   Fax:  [phone]  Physical Therapy Treatment  Patient Details  Name: Tommy Green MRN: [id number] Date of Birth: Mar 08, 1946 Referring Provider (PT): Riki Sheer, [place]   Encounter Date: 10/27/2021   PT End of Session - 10/27/21 1756     Visit Number 5    Number of Visits 16    Date for PT Re-Evaluation 12/03/21    Authorization Type Aetna MCR    Progress Note Due on Visit 10    PT Start Time 4166    PT Stop Time 1611    PT Time Calculation (min) 40 min    Activity Tolerance Patient tolerated treatment well;Patient limited by fatigue    Behavior During Therapy [facility] for tasks assessed/performed             Past Medical History:  Diagnosis Date   Anemia    Essential hypertension 07/18/2017   Foot drop, left foot    due to back surgery in 01/2021   History of colonic polyps    History of rheumatic fever    History of stroke 07/18/2017   Hypertriglyceridemia 07/18/2017   Type 2 diabetes mellitus with diabetic neuropathy, unspecified ([place]) 07/18/2017    Past Surgical History:  Procedure Laterality Date   COLONOSCOPY     around 2018 [facility]   COLONOSCOPY  09/02/2020   CRANIOTOMY Left 10/25/2019   Procedure: CRANIOTOMY HEMATOMA EVACUATION SUBDURAL;  Surgeon: Vallarie Mare, MD;  Location: [facility];  Service: Neurosurgery;  Laterality: Left;   ESOPHAGOGASTRODUODENOSCOPY     around 2018 with [facility]   FOOT CAPSULOTOMY Left 07/25/2008   Mid Foot #2 MPJ   Hammertoe Repair Left 07/25/2008   #2 toe   SPINAL FUSION  01/2021   TARSAL TUNNEL RELEASE Left 07/25/2008   UPPER GASTROINTESTINAL ENDOSCOPY  09/02/2020    There were no vitals filed for this visit.   Subjective Assessment - 10/27/21 1533     Subjective Pt reports some lower back soreness, feeling like his legs are still weak.    Pertinent History T2DM,  HTN, spinal fusion 4/22, heart murmur, brain bleed Jan 2020, RA    Currently in Pain? No/denies                               [facility] Adult PT Treatment/Exercise - 10/27/21 0001       Ambulation/Gait   Ambulation/Gait Yes    Ambulation/Gait Assistance 5: Supervision    Ambulation Distance (Feet) 180 Feet    Assistive device 4-wheeled walker    Gait Pattern Step-through pattern;Decreased dorsiflexion - right;Decreased dorsiflexion - left      Knee/Hip Exercises: Aerobic   Nustep L5 x 7 min      Knee/Hip Exercises: Standing   Heel Raises Both;10 reps    Heel Raises Limitations limited    Hip Flexion AROM;Both;10 reps;Knee bent;2 sets    Hip Flexion Limitations alt toe tap to 9' step    Hip Abduction Stengthening;Both;10 reps;Knee straight    Abduction Limitations UE support    Hip Extension Stengthening;Both;10 reps;Knee straight    Extension Limitations UE support    Other Standing Knee Exercises church pews and retro step with UE support 10x each    Other Standing Knee Exercises HS curls with UE support 10x  Knee/Hip Exercises: Seated   Ball Squeeze 20 reps    Other Seated Knee/Hip Exercises heel squeeze 10x                       PT Short Term Goals - 10/13/21 1805       PT SHORT TERM GOAL #1   Title Ind and compliant with initial HEP    Time 4    Period Weeks    Status On-going   10/13/21- performed this morning.   Target Date 11/05/21      PT SHORT TERM GOAL #2   Title Improved 5x sit to stand to <= 15 sec    Time 4    Period Weeks    Status On-going      PT SHORT TERM GOAL #3   Title Improved TUG to <= 16 sec    Time 4    Period Weeks    Status On-going               PT Long Term Goals - 10/13/21 1806       PT LONG TERM GOAL #1   Title Ind and compliant with HEP to maintain therapy gains    Time 8    Period Weeks    Status On-going    Target Date 12/03/21      PT LONG TERM GOAL #2   Title Patient able to  safely ambulate at home in community with least restrictive AD and a normal gait pattern including good heel strike bil (with or without AFO).    Time 8    Period Weeks    Status On-going    Target Date 12/03/21      PT LONG TERM GOAL #3   Title Patient to demo increased bil LE strength to allow him to don/doff his pants independently and apply pressure to gas/brake pedals to allow return to driving.    Time 8    Period Weeks    Status On-going    Target Date 12/03/21      PT LONG TERM GOAL #4   Title Patient to demonstrate improved 5x sit to stand to <= 12 sec and TUG to <= 10 sec (with rollator) to decrease risk of falls.    Time 8    Period Weeks    Status On-going    Target Date 12/03/21      PT LONG TERM GOAL #5   Title Patient able to get in/out of bed independently at home.    Time 8    Period Weeks    Status On-going    Target Date 12/03/21      PT LONG TERM GOAL #6   Title Patient able to demo increased LE strength by being able to ascend/descend his stairs at home with a reciprocal gait pattern using railing.    Time 8    Period Weeks    Status On-going    Target Date 12/03/21                   Plan - 10/27/21 1612     Clinical Impression Statement Pt today showing increase endurance in WB positions. Even though his endurance has improved, I noticed that he does tend to sacrifice his form when he becomes fatigued and needs instruction to rest instead of doing exercises sacrificing form. He showed a better gait pattern today with better heel-toe pattern. Cues required with most exercises to correct form but  otherwise pt responded well.    Personal Factors and Comorbidities Comorbidity 3+;Time since onset of injury/illness/exacerbation;Behavior Pattern    Comorbidities T2DM, HTN, spinal fusion 4/22, heart murmur, brain bleed Jan 2020, RA, neuropathy    PT Frequency 2x / week    PT Duration 8 weeks    PT Treatment/Interventions ADLs/Self Care Home  Management;Aquatic Therapy;Biofeedback;Cryotherapy;Electrical Stimulation;Moist Heat;Neuromuscular re-education;Balance training;Therapeutic exercise;Therapeutic activities;Functional mobility training;Stair training;Gait training;Patient/family education;Manual techniques;Dry needling;Passive range of motion;Taping    PT Next Visit Plan LE strengthening (including ankles) using machines for greater resistance and cardio equipment; try NMES for left ant tib activation; balance/gait/stairs; see functional goals    PT Home Exercise Plan [email or web address]    Consulted and Agree with Plan of Care Patient;Family member/caregiver             Patient will benefit from skilled therapeutic intervention in order to improve the following deficits and impairments:  Abnormal gait, Decreased range of motion, Pain, Decreased activity tolerance, Decreased balance, Impaired flexibility, Decreased strength  Visit Diagnosis: Muscle weakness (generalized)  Foot drop, left  Unsteadiness on feet  Other abnormalities of gait and mobility  Chronic midline low back pain, unspecified whether sciatica present     Problem List Patient Active Problem List   Diagnosis Date Noted   Foot drop, left foot 10/02/2021   History of colonic polyps 10/02/2021   History of rheumatic fever 10/02/2021   Stress fracture of left foot 09/03/2021   Gabapentin overdose, accidental or unintentional, initial encounter 03/08/2021   TIA (transient ischemic attack) 03/05/2021   Body mass index (BMI) 36.0-36.9, adult 10/03/2020   Subdural hemorrhage following injury without open intracranial wound and with prolonged loss of consciousness (more than 24 hours) without return to pre-existing conscious level ([place]) 10/03/2020   AKI (acute kidney injury) ([place])    Acute encephalopathy    SDH (subdural hematoma) 94/85/4627   Cyclic vomiting syndrome 08/06/2019   Neuropathy 12/25/2018   AK (actinic keratosis) 08/24/2018   Neoplasm of  uncertain behavior 03/50/0938   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   Tendinopathy of right gluteus medius 07/13/2018   Tendinopathy of left gluteus medius 07/13/2018   Squamous cell carcinoma in situ (SCCIS) of skin 03/24/2018   Vitamin D deficiency 11/29/2017   Stage 3 chronic kidney disease ([place]) 10/28/2017   Primary osteoarthritis of right hip 09/11/2017   Essential hypertension 07/18/2017   Hyperlipidemia associated with type 2 diabetes mellitus ([place]) 07/18/2017   Type 2 diabetes mellitus with diabetic neuropathy, unspecified ([place]) 07/18/2017   Heart murmur 07/18/2017   Hypertriglyceridemia 07/18/2017   History of stroke 07/18/2017   DDD (degenerative disc disease), lumbar 06/15/2017   Iron deficiency anemia 06/15/2017   Stroke ([place]) 06/15/2017   Encounter for hepatitis C screening test for low risk patient 06/30/2016   Microalbuminuria due to type 2 diabetes mellitus ([place]) 03/19/2016   Hypertrophy of inferior nasal turbinate 12/20/2015   Disease of nasal cavity and sinuses 12/10/2015   Degenerative tear of acetabular labrum of left hip 11/04/2015   History of tobacco abuse 05/15/2015   Morbid (severe) obesity due to excess calories ([place]) 04/11/2015   Disorder of both eustachian tubes 01/27/2015   Maxillary sinusitis 01/13/2015   Lipoprotein deficiency 01/31/2014   Gastro-esophageal reflux disease without esophagitis 11/02/2013   Rheumatic fever 11/02/2013   Erectile dysfunction associated with type 2 diabetes mellitus ([place]) 07/31/2013   Type 2 diabetes mellitus with hyperglycemia, without long-term current use of insulin ([place]) 11/16/2012    Artist Pais, PTA 10/27/2021, 5:58 PM  [facility] [address]  [place], [place], [address] Phone: [phone]   Fax:  [phone]  Name: Rollan Roger Sterba MRN: [id number] Date of Birth: 02-23-1946

## 2021-10-28 ENCOUNTER — Telehealth: Payer: Self-pay

## 2021-10-28 NOTE — Telephone Encounter (Signed)
PA renewal initiated automatically by CoverMyMeds.  Submitted a Prior Authorization request to [facility] for ENBREL via CoverMyMeds. Will update once we receive a response.   Key: [id number]

## 2021-10-28 NOTE — Telephone Encounter (Signed)
Received notification from [facility] regarding a prior authorization for ENBREL. Authorization has been APPROVED from 10/18/2021 to 10/17/2022. Approval letter sent to scan center.  Authorization # [id number]

## 2021-10-29 DIAGNOSIS — E119 Type 2 diabetes mellitus without complications: Secondary | ICD-10-CM | POA: Diagnosis not present

## 2021-11-02 ENCOUNTER — Encounter: Payer: Self-pay | Admitting: Physical Therapy

## 2021-11-02 ENCOUNTER — Other Ambulatory Visit: Payer: Self-pay

## 2021-11-02 ENCOUNTER — Ambulatory Visit: Payer: Medicare HMO | Admitting: Physical Therapy

## 2021-11-02 ENCOUNTER — Telehealth: Payer: Self-pay

## 2021-11-02 DIAGNOSIS — R2689 Other abnormalities of gait and mobility: Secondary | ICD-10-CM

## 2021-11-02 DIAGNOSIS — R2681 Unsteadiness on feet: Secondary | ICD-10-CM

## 2021-11-02 DIAGNOSIS — M21372 Foot drop, left foot: Secondary | ICD-10-CM

## 2021-11-02 DIAGNOSIS — M25511 Pain in right shoulder: Secondary | ICD-10-CM | POA: Diagnosis not present

## 2021-11-02 DIAGNOSIS — M545 Low back pain, unspecified: Secondary | ICD-10-CM | POA: Diagnosis not present

## 2021-11-02 DIAGNOSIS — G8929 Other chronic pain: Secondary | ICD-10-CM | POA: Diagnosis not present

## 2021-11-02 DIAGNOSIS — M6281 Muscle weakness (generalized): Secondary | ICD-10-CM

## 2021-11-02 NOTE — Therapy (Signed)
[facility] [address]  [place], [place], [address] Phone: [phone]   Fax:  [phone]  Physical Therapy Treatment  Patient Details  Name: Tommy Green MRN: [id number] Date of Birth: 12-18-1945 Referring Provider (PT): Riki Sheer, DO   Encounter Date: 11/02/2021   PT End of Session - 11/02/21 1406     Visit Number 6    Number of Visits 16    Date for PT Re-Evaluation 12/03/21    Authorization Type [employer] MCR    Progress Note Due on Visit 10    PT Start Time 1401    PT Stop Time 7494    PT Time Calculation (min) 41 min    Activity Tolerance Patient tolerated treatment well;Patient limited by fatigue    Behavior During Therapy [facility] for tasks assessed/performed             Past Medical History:  Diagnosis Date   Anemia    Essential hypertension 07/18/2017   Foot drop, left foot    due to back surgery in 01/2021   History of colonic polyps    History of rheumatic fever    History of stroke 07/18/2017   Hypertriglyceridemia 07/18/2017   Type 2 diabetes mellitus with diabetic neuropathy, unspecified ([place]) 07/18/2017    Past Surgical History:  Procedure Laterality Date   COLONOSCOPY     around 2018 [facility]   COLONOSCOPY  09/02/2020   CRANIOTOMY Left 10/25/2019   Procedure: CRANIOTOMY HEMATOMA EVACUATION SUBDURAL;  Surgeon: Vallarie Mare, MD;  Location: [facility];  Service: Neurosurgery;  Laterality: Left;   ESOPHAGOGASTRODUODENOSCOPY     around 2018 with [facility]   FOOT CAPSULOTOMY Left 07/25/2008   Mid Foot #2 MPJ   Hammertoe Repair Left 07/25/2008   #2 toe   SPINAL FUSION  01/2021   TARSAL TUNNEL RELEASE Left 07/25/2008   UPPER GASTROINTESTINAL ENDOSCOPY  09/02/2020    There were no vitals filed for this visit.   Subjective Assessment - 11/02/21 1405     Subjective Pt. reports arms/hands have been hurting from putting too much pressure on walker.  Forgot to bring  new NMES unit today to set up.    Pertinent History T2DM, HTN, spinal fusion 4/22, heart murmur, brain bleed Jan 2020, RA    Currently in Pain? Yes    Pain Score 6     Pain Location Wrist    Pain Orientation Left                               [facility] Adult PT Treatment/Exercise - 11/02/21 0001       Knee/Hip Exercises: Aerobic   Nustep L6 x 6 min    Other Aerobic Gait x 180' with 4WRW, SBA for safety      Knee/Hip Exercises: Machines for Strengthening   Cybex Knee Extension 15# 2 x 10    Cybex Knee Flexion 10# 1 x 10, 20# 2 x 10      Knee/Hip Exercises: Standing   Heel Raises Both;10 reps    Heel Raises Limitations limited    Hip Abduction Stengthening;Both;3 sets;15 reps    Abduction Limitations UE support    Functional Squat 2 sets;10 reps    Functional Squat Limitations UE support on counter    Other Standing Knee Exercises church pews with UE support 10x each      Knee/Hip Exercises: Seated  Ball Squeeze 20 reps    Other Seated Knee/Hip Exercises heel raises x 15                       PT Short Term Goals - 10/13/21 1805       PT SHORT TERM GOAL #1   Title Ind and compliant with initial HEP    Time 4    Period Weeks    Status On-going   10/13/21- performed this morning.   Target Date 11/05/21      PT SHORT TERM GOAL #2   Title Improved 5x sit to stand to <= 15 sec    Time 4    Period Weeks    Status On-going      PT SHORT TERM GOAL #3   Title Improved TUG to <= 16 sec    Time 4    Period Weeks    Status On-going               PT Long Term Goals - 10/13/21 1806       PT LONG TERM GOAL #1   Title Ind and compliant with HEP to maintain therapy gains    Time 8    Period Weeks    Status On-going    Target Date 12/03/21      PT LONG TERM GOAL #2   Title Patient able to safely ambulate at home in community with least restrictive AD and a normal gait pattern including good heel strike bil (with or without AFO).     Time 8    Period Weeks    Status On-going    Target Date 12/03/21      PT LONG TERM GOAL #3   Title Patient to demo increased bil LE strength to allow him to don/doff his pants independently and apply pressure to gas/brake pedals to allow return to driving.    Time 8    Period Weeks    Status On-going    Target Date 12/03/21      PT LONG TERM GOAL #4   Title Patient to demonstrate improved 5x sit to stand to <= 12 sec and TUG to <= 10 sec (with rollator) to decrease risk of falls.    Time 8    Period Weeks    Status On-going    Target Date 12/03/21      PT LONG TERM GOAL #5   Title Patient able to get in/out of bed independently at home.    Time 8    Period Weeks    Status On-going    Target Date 12/03/21      PT LONG TERM GOAL #6   Title Patient able to demo increased LE strength by being able to ascend/descend his stairs at home with a reciprocal gait pattern using railing.    Time 8    Period Weeks    Status On-going    Target Date 12/03/21                   Plan - 11/02/21 1452     Clinical Impression Statement Focus of skilled physical therapy today was continuing to improve LE strength and endurance.  Patient still fatigues quickly needing seated rest breaks, so alternated seated and standing exercies.  Cues with standing exercises to try to keep light touch on counter for balance only to avoid aggravating UE pain, also cues for posture with walking.  He has discontinued his L AFO (  too difficulty to get on off) but clearing L foot.  To bring EMSI unit to set up for LLE strengthening next session.  He would benefit from continued skilled therapy.    Personal Factors and Comorbidities Comorbidity 3+;Time since onset of injury/illness/exacerbation;Behavior Pattern    Comorbidities T2DM, HTN, spinal fusion 4/22, heart murmur, brain bleed Jan 2020, RA, neuropathy    PT Frequency 2x / week    PT Duration 8 weeks    PT Treatment/Interventions ADLs/Self Care Home  Management;Aquatic Therapy;Biofeedback;Cryotherapy;Electrical Stimulation;Moist Heat;Neuromuscular re-education;Balance training;Therapeutic exercise;Therapeutic activities;Functional mobility training;Stair training;Gait training;Patient/family education;Manual techniques;Dry needling;Passive range of motion;Taping    PT Next Visit Plan LE strengthening (including ankles) using machines for greater resistance and cardio equipment; try NMES for left ant tib activation; balance/gait/stairs; see functional goals    PT Home Exercise Plan [id number]    Consulted and Agree with Plan of Care Patient;Family member/caregiver             Patient will benefit from skilled therapeutic intervention in order to improve the following deficits and impairments:  Abnormal gait, Decreased range of motion, Pain, Decreased activity tolerance, Decreased balance, Impaired flexibility, Decreased strength  Visit Diagnosis: Muscle weakness (generalized)  Foot drop, left  Unsteadiness on feet  Other abnormalities of gait and mobility     Problem List Patient Active Problem List   Diagnosis Date Noted   Foot drop, left foot 10/02/2021   History of colonic polyps 10/02/2021   History of rheumatic fever 10/02/2021   Stress fracture of left foot 09/03/2021   Gabapentin overdose, accidental or unintentional, initial encounter 03/08/2021   TIA (transient ischemic attack) 03/05/2021   Body mass index (BMI) 36.0-36.9, adult 10/03/2020   Subdural hemorrhage following injury without open intracranial wound and with prolonged loss of consciousness (more than 24 hours) without return to pre-existing conscious level ([place]) 10/03/2020   AKI (acute kidney injury) ([place])    Acute encephalopathy    SDH (subdural hematoma) 84/69/6295   Cyclic vomiting syndrome 08/06/2019   Neuropathy 12/25/2018   AK (actinic keratosis) 08/24/2018   Neoplasm of uncertain behavior 28/41/3244   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   Tendinopathy  of right gluteus medius 07/13/2018   Tendinopathy of left gluteus medius 07/13/2018   Squamous cell carcinoma in situ (SCCIS) of skin 03/24/2018   Vitamin D deficiency 11/29/2017   Stage 3 chronic kidney disease ([place]) 10/28/2017   Primary osteoarthritis of right hip 09/11/2017   Essential hypertension 07/18/2017   Hyperlipidemia associated with type 2 diabetes mellitus ([place]) 07/18/2017   Type 2 diabetes mellitus with diabetic neuropathy, unspecified ([place]) 07/18/2017   Heart murmur 07/18/2017   Hypertriglyceridemia 07/18/2017   History of stroke 07/18/2017   DDD (degenerative disc disease), lumbar 06/15/2017   Iron deficiency anemia 06/15/2017   Stroke ([place]) 06/15/2017   Encounter for hepatitis C screening test for low risk patient 06/30/2016   Microalbuminuria due to type 2 diabetes mellitus ([place]) 03/19/2016   Hypertrophy of inferior nasal turbinate 12/20/2015   Disease of nasal cavity and sinuses 12/10/2015   Degenerative tear of acetabular labrum of left hip 11/04/2015   History of tobacco abuse 05/15/2015   Morbid (severe) obesity due to excess calories ([place]) 04/11/2015   Disorder of both eustachian tubes 01/27/2015   Maxillary sinusitis 01/13/2015   Lipoprotein deficiency 01/31/2014   Gastro-esophageal reflux disease without esophagitis 11/02/2013   Rheumatic fever 11/02/2013   Erectile dysfunction associated with type 2 diabetes mellitus ([place]) 07/31/2013   Type 2 diabetes mellitus with hyperglycemia, without  long-term current use of insulin ([place]) 11/16/2012    Rennie Natter, PT, DPT  11/02/2021, 3:06 PM  [facility] [address]  [place], [place], [address] Phone: [phone]   Fax:  [phone]  Name: Tommy Green MRN: [id number] Date of Birth: Feb 24, 1946

## 2021-11-02 NOTE — Telephone Encounter (Signed)
Spoke with Tommy Green per DPR who wanted to know where the blockage was and how bad. Tommy Green was advised that it was the aortic valve and was severe. Tommy Green had no additional questions and verbalized understanding.

## 2021-11-02 NOTE — Telephone Encounter (Signed)
Patients wife came to the [facility] office asking for more information about her husbands ultrasound results.Please advise.

## 2021-11-04 ENCOUNTER — Other Ambulatory Visit: Payer: Self-pay

## 2021-11-04 ENCOUNTER — Ambulatory Visit: Payer: Medicare HMO

## 2021-11-04 DIAGNOSIS — M545 Low back pain, unspecified: Secondary | ICD-10-CM | POA: Diagnosis not present

## 2021-11-04 DIAGNOSIS — M6281 Muscle weakness (generalized): Secondary | ICD-10-CM

## 2021-11-04 DIAGNOSIS — R2681 Unsteadiness on feet: Secondary | ICD-10-CM

## 2021-11-04 DIAGNOSIS — R2689 Other abnormalities of gait and mobility: Secondary | ICD-10-CM

## 2021-11-04 DIAGNOSIS — M21372 Foot drop, left foot: Secondary | ICD-10-CM

## 2021-11-04 DIAGNOSIS — G8929 Other chronic pain: Secondary | ICD-10-CM | POA: Diagnosis not present

## 2021-11-04 NOTE — Progress Notes (Signed)
Office Visit Note  Patient: Tommy Green             Date of Birth: [birth year]           MRN: [id number]             PCP: [name], DO Referring: [name]* Visit Date: 11/17/2021 Occupation: @GUAROCC @  Subjective:  Medication management  History of Present Illness: Tommy Green is a 76 y.o. male with a history of rheumatoid arthritis and osteoarthritis.  He has been taking leflunomide and Enbrel on a regular basis.  He states he has discomfort in his right shoulder and right elbow especially when he sleeps on the right side.  He has been trying to sleep on his back.  He has not noticed any joint swelling.  He mobilizes with the help of a walker.  He cannot use to have some discomfort in his knees and his lower back which has been stable.  He states that the granuloma annulare rash resolved.  He also had some intentional weight loss which has been helpful.  Activities of Daily Living:  Patient reports morning stiffness for 1 hour.   Patient Denies nocturnal pain.  Difficulty dressing/grooming: Reports Difficulty climbing stairs: Reports Difficulty getting out of chair: Denies Difficulty using hands for taps, buttons, cutlery, and/or writing: Reports  Review of Systems  Constitutional:  Positive for weight loss. Negative for fatigue.       Intentional  HENT:  Positive for mouth dryness. Negative for mouth sores and nose dryness.   Eyes:  Positive for dryness. Negative for pain and itching.  Respiratory:  Negative for shortness of breath and difficulty breathing.   Cardiovascular:  Negative for chest pain and palpitations.  Gastrointestinal:  Negative for blood in stool, constipation and diarrhea.  Endocrine: Negative for increased urination.  Genitourinary:  Negative for difficulty urinating.  Musculoskeletal:  Positive for joint swelling and morning stiffness. Negative for joint pain, joint pain, myalgias, muscle tenderness and myalgias.   Skin:  Negative for color change, rash and redness.  Allergic/Immunologic: Negative for susceptible to infections.  Neurological:  Negative for dizziness, numbness, headaches, memory loss and weakness.  Hematological:  Negative for bruising/bleeding tendency.  Psychiatric/Behavioral:  Negative for confusion.    PMFS History:  Patient Active Problem List   Diagnosis Date Noted   Foot drop, left foot 10/02/2021   History of colonic polyps 10/02/2021   History of rheumatic fever 10/02/2021   Stress fracture of left foot 09/03/2021   Gabapentin overdose, accidental or unintentional, initial encounter 03/08/2021   TIA (transient ischemic attack) 03/05/2021   Body mass index (BMI) 36.0-36.9, adult 10/03/2020   Subdural hemorrhage following injury without open intracranial wound and with prolonged loss of consciousness (more than 24 hours) without return to pre-existing conscious level ([place]) 10/03/2020   AKI (acute kidney injury) ([place])    Acute encephalopathy    SDH (subdural hematoma) 56/31/4970   Cyclic vomiting syndrome 08/06/2019   Neuropathy 12/25/2018   AK (actinic keratosis) 08/24/2018   Neoplasm of uncertain behavior 26/37/8588   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   Tendinopathy of right gluteus medius 07/13/2018   Tendinopathy of left gluteus medius 07/13/2018   Squamous cell carcinoma in situ (SCCIS) of skin 03/24/2018   Vitamin D deficiency 11/29/2017   Stage 3 chronic kidney disease ([place]) 10/28/2017   Primary osteoarthritis of right hip 09/11/2017   Essential hypertension 07/18/2017   Hyperlipidemia associated with type 2 diabetes mellitus ([place]) 07/18/2017  Type 2 diabetes mellitus with diabetic neuropathy, unspecified ([place]) 07/18/2017   Heart murmur 07/18/2017   Hypertriglyceridemia 07/18/2017   History of stroke 07/18/2017   DDD (degenerative disc disease), lumbar 06/15/2017   Iron deficiency anemia 06/15/2017   Stroke ([place]) 06/15/2017   Encounter for hepatitis C  screening test for low risk patient 06/30/2016   Microalbuminuria due to type 2 diabetes mellitus ([place]) 03/19/2016   Hypertrophy of inferior nasal turbinate 12/20/2015   Disease of nasal cavity and sinuses 12/10/2015   Degenerative tear of acetabular labrum of left hip 11/04/2015   History of tobacco abuse 05/15/2015   Morbid (severe) obesity due to excess calories ([place]) 04/11/2015   Disorder of both eustachian tubes 01/27/2015   Maxillary sinusitis 01/13/2015   Lipoprotein deficiency 01/31/2014   Gastro-esophageal reflux disease without esophagitis 11/02/2013   Rheumatic fever 11/02/2013   Erectile dysfunction associated with type 2 diabetes mellitus ([place]) 07/31/2013   Type 2 diabetes mellitus with hyperglycemia, without long-term current use of insulin ([place]) 11/16/2012    Past Medical History:  Diagnosis Date   Anemia    Essential hypertension 07/18/2017   Foot drop, left foot    due to back surgery in 01/2021   History of colonic polyps    History of rheumatic fever    History of stroke 07/18/2017   Hypertriglyceridemia 07/18/2017   Type 2 diabetes mellitus with diabetic neuropathy, unspecified ([place]) 07/18/2017    Family History  Problem Relation Age of Onset   Hyperlipidemia Mother    Colon polyps Mother    Hyperlipidemia Father    Alcoholism Brother    Healthy Son    Healthy Son    Cancer Neg Hx    Colon cancer Neg Hx    Esophageal cancer Neg Hx    Rectal cancer Neg Hx    Stomach cancer Neg Hx    Past Surgical History:  Procedure Laterality Date   COLONOSCOPY     around 2018 [facility]   COLONOSCOPY  09/02/2020   CRANIOTOMY Left 10/25/2019   Procedure: CRANIOTOMY HEMATOMA EVACUATION SUBDURAL;  Surgeon: Vallarie Mare, MD;  Location: [facility];  Service: Neurosurgery;  Laterality: Left;   ESOPHAGOGASTRODUODENOSCOPY     around 2018 with [facility]   FOOT CAPSULOTOMY Left 07/25/2008   Mid Foot #2 MPJ   Hammertoe Repair Left 07/25/2008   #2 toe   SPINAL  FUSION  01/2021   TARSAL TUNNEL RELEASE Left 07/25/2008   UPPER GASTROINTESTINAL ENDOSCOPY  09/02/2020   Social History   Social History Narrative   Not on file   Immunization History  Administered Date(s) Administered   Fluad Quad(high Dose 65+) 08/31/2021   Influenza-Unspecified 07/31/2013, 04/17/2014   Moderna Sars-Covid-2 Vaccination 12/11/2019, 01/10/2020, 08/08/2020   Pneumococcal Conjugate-13 08/07/2015   Pneumococcal Polysaccharide-23 04/17/2013, 07/18/2013   Td 12/25/2018   Zoster, Live 04/17/2013     Objective: Vital Signs: BP (!) 147/79 (BP Location: Left Arm, Patient Position: Sitting, Cuff Size: Normal)    Pulse 80    Ht 5' 10.5" (1.791 m)    Wt 224 lb (101.6 kg)    BMI 31.69 kg/m    Physical Exam Vitals and nursing note reviewed.  Constitutional:      Appearance: He is well-developed.  HENT:     Head: Normocephalic and atraumatic.  Eyes:     Conjunctiva/sclera: Conjunctivae normal.     Pupils: Pupils are equal, round, and reactive to light.  Cardiovascular:     Rate and Rhythm:  Normal rate and regular rhythm.     Heart sounds: Normal heart sounds.  Pulmonary:     Effort: Pulmonary effort is normal.     Breath sounds: Normal breath sounds.  Abdominal:     General: Bowel sounds are normal.     Palpations: Abdomen is soft.  Musculoskeletal:     Cervical back: Normal range of motion and neck supple.  Skin:    General: Skin is warm and dry.     Capillary Refill: Capillary refill takes less than 2 seconds.  Neurological:     Mental Status: He is alert and oriented to person, place, and time.  Psychiatric:        Behavior: Behavior normal.     Musculoskeletal Exam: C-spine was not good range of motion.  Thoracic and lumbar spine were difficult to assess in the sitting position.  Shoulder joints were in good range of motion.  He had some discomfort range of motion of the right shoulder joint.  Right elbow joint contracture was noted.  He had no tenderness  over wrist joints.  Bilateral MCP thickening with ulnar deviation was noted.  PIP and DIP thickening was noted.  Hip joints were difficult to assess in the sitting position.  Knee joints with good range of motion.  He had limited range of motion of his ankle joints.  There was no tenderness over ankles or MTPs.  CDAI Exam: CDAI Score: 1.6  Patient Global: 3 mm; Provider Global: 3 mm Swollen: 0 ; Tender: 1  Joint Exam 11/17/2021      Right  Left  Glenohumeral   Tender        Investigation: No additional findings.  Imaging: ECHOCARDIOGRAM COMPLETE  Result Date: 10/21/2021    ECHOCARDIOGRAM REPORT   Patient Name:   LUKEN SHADOWENS Date of Exam: 10/21/2021 Medical Rec #:  [id number]         Height:       70.5 in Accession #:    [id number]        Weight:       231.4 lb Date of Birth:  03-24-1946          BSA:          2.233 m Patient Age:    61 years          BP:           143/69 mmHg Patient Gender: M                 HR:           73 bpm. Exam Location:  [facility] Procedure: 2D Echo, Cardiac Doppler, Color Doppler, Strain Analysis and 3D Echo Indications:    I35.0 Nonrheumatic aortic (valve) stenosis  History:        Patient has prior history of Echocardiogram examinations.                 Stroke, Signs/Symptoms:Murmur and Shortness of Breath; Risk                 Factors:Hypertension, Dyslipidemia, Diabetes and Former Smoker.  Sonographer:    Caesar Chestnut RDCS, RVT Referring Phys: [phone] [name]  Sonographer Comments: Global longitudinal strain was attempted. IMPRESSIONS  1. Vertical orientation on PS views. Left ventricular ejection fraction, by estimation, is 50 to 55%. The left ventricle has low normal function. The left ventricle has no regional wall motion abnormalities. There is mild concentric left ventricular hypertrophy. Left ventricular diastolic  parameters are consistent with Grade I diastolic dysfunction (impaired relaxation).  2. Right ventricular systolic function is normal. The  right ventricular size is normal.  3. Left atrial size was moderately dilated.  4. The mitral valve is normal in structure. Trivial mitral valve regurgitation. No evidence of mitral stenosis.  5. Normal SVI/Flow     . The aortic valve is tricuspid. There is severe calcifcation of the aortic valve. There is moderate thickening of the aortic valve. Aortic valve regurgitation is not visualized. Severe aortic valve stenosis.  6. There is mild dilatation of the ascending aorta, measuring 37 mm.  7. The inferior vena cava is normal in size with greater than 50% respiratory variability, suggesting right atrial pressure of 3 mmHg. Comparison(s): LVEF 55-60%, Severe AS. FINDINGS  Left Ventricle: Vertical orientation on PS views. Left ventricular ejection fraction, by estimation, is 50 to 55%. The left ventricle has low normal function. The left ventricle has no regional wall motion abnormalities. Global longitudinal strain performed but not reported based on interpreter judgement due to suboptimal tracking. The left ventricular internal cavity size was normal in size. There is mild concentric left ventricular hypertrophy. Left ventricular diastolic parameters are consistent with Grade I diastolic dysfunction (impaired relaxation). Indeterminate filling pressures. Right Ventricle: The right ventricular size is normal. No increase in right ventricular wall thickness. Right ventricular systolic function is normal. Left Atrium: Left atrial size was moderately dilated. Right Atrium: Right atrial size was normal in size. Pericardium: There is no evidence of pericardial effusion. Mitral Valve: The mitral valve is normal in structure. Mild mitral annular calcification. Trivial mitral valve regurgitation. No evidence of mitral valve stenosis. MV peak gradient, 9.4 mmHg. The mean mitral valve gradient is 4.0 mmHg. Tricuspid Valve: The tricuspid valve is normal in structure. Tricuspid valve regurgitation is trivial. No evidence of  tricuspid stenosis. Aortic Valve: Normal SVI/Flow. The aortic valve is tricuspid. There is severe calcifcation of the aortic valve. There is moderate thickening of the aortic valve. Aortic valve regurgitation is not visualized. Severe aortic stenosis is present. Aortic valve mean gradient measures 44.8 mmHg. Aortic valve peak gradient measures 67.8 mmHg. Aortic valve area, by VTI measures 0.75 cm. Pulmonic Valve: The pulmonic valve was normal in structure. Pulmonic valve regurgitation is not visualized. No evidence of pulmonic stenosis. Aorta: The aortic root is normal in size and structure and the aortic arch was not well visualized. There is mild dilatation of the ascending aorta, measuring 37 mm. Venous: A normal flow pattern is recorded from the right upper pulmonary vein. The inferior vena cava is normal in size with greater than 50% respiratory variability, suggesting right atrial pressure of 3 mmHg. IAS/Shunts: No atrial level shunt detected by color flow Doppler.  LEFT VENTRICLE PLAX 2D LVIDd:         5.40 cm      Diastology LVIDs:         4.10 cm      LV e' medial:    3.41 cm/s LV PW:         1.40 cm      LV E/e' medial:  27.8 LV IVS:        1.20 cm      LV e' lateral:   7.22 cm/s LVOT diam:     2.00 cm      LV E/e' lateral: 13.1 LV SV:         81 LV SV Index:   36 LVOT Area:     3.14  cm  LV Volumes (MOD) LV vol d, MOD A2C: 171.0 ml LV vol d, MOD A4C: 183.0 ml LV vol s, MOD A2C: 81.5 ml LV vol s, MOD A4C: 89.3 ml LV SV MOD A2C:     89.5 ml LV SV MOD A4C:     183.0 ml LV SV MOD BP:      94.9 ml RIGHT VENTRICLE             IVC RV Basal diam:  3.40 cm     IVC diam: 1.40 cm RV Mid diam:    2.70 cm RV S prime:     17.00 cm/s TAPSE (M-mode): 3.2 cm LEFT ATRIUM              Index        RIGHT ATRIUM           Index LA Vol (A2C):   96.8 ml  43.36 ml/m  RA Area:     13.10 cm LA Vol (A4C):   93.8 ml  42.01 ml/m  RA Volume:   31.50 ml  14.11 ml/m LA Biplane Vol: 102.0 ml 45.69 ml/m  AORTIC VALVE                      PULMONIC VALVE AV Area (Vmax):    0.71 cm      PV Vmax:       1.19 m/s AV Area (Vmean):   0.60 cm      PV Peak grad:  5.7 mmHg AV Area (VTI):     0.75 cm AV Vmax:           411.80 cm/s AV Vmean:          319.400 cm/s AV VTI:            1.082 m AV Peak Grad:      67.8 mmHg AV Mean Grad:      44.8 mmHg LVOT Vmax:         93.30 cm/s LVOT Vmean:        61.050 cm/s LVOT VTI:          0.258 m LVOT/AV VTI ratio: 0.24  AORTA Ao Root diam: 3.30 cm Ao Asc diam:  3.70 cm MITRAL VALVE                TRICUSPID VALVE MV Area (PHT): 2.15 cm     TR Peak grad:   19.9 mmHg MV Area VTI:   2.07 cm     TR Vmax:        223.00 cm/s MV Peak grad:  9.4 mmHg MV Mean grad:  4.0 mmHg     SHUNTS MV Vmax:       1.53 m/s     Systemic VTI:  0.26 m MV Vmean:      90.6 cm/s    Systemic Diam: 2.00 cm MV Decel Time: 353 msec MV E velocity: 94.90 cm/s MV A velocity: 152.00 cm/s MV E/A ratio:  0.62 Shirlee More MD Electronically signed by Shirlee More MD Signature Date/Time: 10/21/2021/6:03:10 PM    Final     Recent Labs: Lab Results  Component Value Date   WBC 8.3 06/16/2021   HGB 10.5 (L) 06/16/2021   PLT 312 06/16/2021   NA 137 06/16/2021   K 4.8 06/16/2021   CL 104 06/16/2021   CO2 25 06/16/2021   GLUCOSE 84 06/16/2021   BUN 26 (H) 06/16/2021   CREATININE 1.50 (H) 06/16/2021  BILITOT 0.3 06/16/2021   ALKPHOS 31 (L) 05/04/2021   AST 13 06/16/2021   ALT 9 06/16/2021   PROT 6.5 06/16/2021   ALBUMIN 4.1 05/04/2021   CALCIUM 9.6 06/16/2021   GFRAA 48 (L) 07/15/2020   QFTBGOLDPLUS NEGATIVE 06/16/2021    Speciality Comments: No specialty comments available.  Procedures:  No procedures performed Allergies: Fluzone quadrivalent [influenza vac split quad], Influenza vaccines, Aspirin, Atorvastatin, Canagliflozin, and Statins   Assessment / Plan:     Visit Diagnoses: Rheumatoid arthritis involving multiple sites with positive rheumatoid factor (HCC) - Positive RF, positive anti-CCP, elevated sedimentation rate, history  of inflammatory arthritis: He had no synovitis on my examination.  He continues to have some discomfort due to underlying arthritis.  High risk medication use - Arava 10 mg 1 tablet by mouth daily and Enbrel mini 50 mg sq injections once weekly. -Labs from June 16, 2021 were reviewed which showed anemia with hemoglobin of 10.5.  Creatinine was elevated at 1.5.  TB gold was negative on June 16, 2021.  Plan: CBC with Differential/Platelet, COMPLETE METABOLIC PANEL WITH GFR today and then every 3 months to monitor for drug toxicity.  Information regarding immunization was placed in the AVS.  He was also advised to hold leflunomide and Enbrel in case he develops an infection and to resume after infection resolves.  He was advised to get yearly skin examination to screen for skin cancer.  Chronic right shoulder pain-he continues to have chronic discomfort in his right shoulder.  He declined cortisone injection.  Contracture of right elbow-unchanged.  No synovitis was noted.  Primary osteoarthritis of both hips - Moderate to severe osteoarthritis of both hips.  He mobilizes with the help of  Primary osteoarthritis of left knee-8 good range of motion without any warmth swelling or effusion.  DDD (degenerative disc disease), lumbar - L4-L5 fusion by Dr.Cohen.  Facet joint arthropathy.   He underwent a lumbar fusion in April 2022.  He denies any current discomfort.  Essential hypertension-blood pressure was elevated.  He was advised to monitor blood pressure closely.  SDH (subdural hematoma)  Heart murmur  Hyperlipidemia associated with type 2 diabetes mellitus (HCC)  History of stroke - At age 41 with left-sided weakness  Granuloma annulare  Type 2 diabetes mellitus with diabetic neuropathy, with long-term current use of insulin (HCC)  Squamous cell carcinoma in situ (SCCIS) of skin  Orders: Orders Placed This Encounter  Procedures   CBC with Differential/Platelet   COMPLETE METABOLIC  PANEL WITH GFR   No orders of the defined types were placed in this encounter.    Follow-Up Instructions: Return in about 5 months (around 04/16/2022) for Rheumatoid arthritis.   Bo Merino, MD  Note - This record has been created using Editor, commissioning.  Chart creation errors have been sought, but may not always  have been located. Such creation errors do not reflect on  the standard of medical care.

## 2021-11-04 NOTE — Therapy (Signed)
[facility] [address]  [place], [place], [address] Phone: [phone]   Fax:  [phone]  Physical Therapy Treatment  Patient Details  Name: Tommy Green MRN: [id number] Date of Birth: 1946-03-30 Referring Provider (PT): Riki Sheer, DO   Encounter Date: 11/04/2021   PT End of Session - 11/04/21 1450     Visit Number 7    Number of Visits 16    Date for PT Re-Evaluation 12/03/21    Authorization Type [employer] MCR    Progress Note Due on Visit 10    PT Start Time 1405    PT Stop Time 1448    PT Time Calculation (min) 43 min    Activity Tolerance Patient tolerated treatment well;Patient limited by fatigue    Behavior During Therapy [facility] for tasks assessed/performed             Past Medical History:  Diagnosis Date   Anemia    Essential hypertension 07/18/2017   Foot drop, left foot    due to back surgery in 01/2021   History of colonic polyps    History of rheumatic fever    History of stroke 07/18/2017   Hypertriglyceridemia 07/18/2017   Type 2 diabetes mellitus with diabetic neuropathy, unspecified ([place]) 07/18/2017    Past Surgical History:  Procedure Laterality Date   COLONOSCOPY     around 2018 [facility]   COLONOSCOPY  09/02/2020   CRANIOTOMY Left 10/25/2019   Procedure: CRANIOTOMY HEMATOMA EVACUATION SUBDURAL;  Surgeon: Vallarie Mare, MD;  Location: [facility];  Service: Neurosurgery;  Laterality: Left;   ESOPHAGOGASTRODUODENOSCOPY     around 2018 with [facility]   FOOT CAPSULOTOMY Left 07/25/2008   Mid Foot #2 MPJ   Hammertoe Repair Left 07/25/2008   #2 toe   SPINAL FUSION  01/2021   TARSAL TUNNEL RELEASE Left 07/25/2008   UPPER GASTROINTESTINAL ENDOSCOPY  09/02/2020    There were no vitals filed for this visit.   Subjective Assessment - 11/04/21 1406     Subjective Pt reports that it is easier to get around nowadays, brought in NMES today.    Pertinent History  T2DM, HTN, spinal fusion 4/22, heart murmur, brain bleed Jan 2020, RA    Currently in Pain? Yes    Pain Score 2     Pain Location Back    Pain Orientation Left;Right    Pain Descriptors / Indicators Sore    Pain Type Acute pain                               [facility] Adult PT Treatment/Exercise - 11/04/21 0001       Ambulation/Gait   Ambulation/Gait Yes    Ambulation/Gait Assistance 5: Supervision    Ambulation Distance (Feet) 330 Feet    Assistive device 4-wheeled walker    Gait Pattern Step-through pattern;Decreased dorsiflexion - right;Decreased dorsiflexion - left;Trunk flexed      Exercises   Exercises Knee/Hip      Knee/Hip Exercises: Aerobic   Nustep L5x10mn      Knee/Hip Exercises: Machines for Strengthening   Cybex Knee Extension 15# 2 x 10    Cybex Knee Flexion 25# 2x10      Knee/Hip Exercises: Standing   Forward Step Up Both;15 reps;Hand Hold: 2;Step Height: 4"      Knee/Hip Exercises: Seated   Sit to Sand 10 reps   with  elevated seat                      PT Short Term Goals - 10/13/21 1805       PT SHORT TERM GOAL #1   Title Ind and compliant with initial HEP    Time 4    Period Weeks    Status On-going   10/13/21- performed this morning.   Target Date 11/05/21      PT SHORT TERM GOAL #2   Title Improved 5x sit to stand to <= 15 sec    Time 4    Period Weeks    Status On-going      PT SHORT TERM GOAL #3   Title Improved TUG to <= 16 sec    Time 4    Period Weeks    Status On-going               PT Long Term Goals - 11/04/21 1450       PT LONG TERM GOAL #1   Title Ind and compliant with HEP to maintain therapy gains    Time 8    Period Weeks    Status On-going    Target Date 12/03/21      PT LONG TERM GOAL #2   Title Patient able to safely ambulate at home in community with least restrictive AD and a normal gait pattern including good heel strike bil (with or without AFO).    Time 8    Period Weeks     Status On-going    Target Date 12/03/21      PT LONG TERM GOAL #3   Title Patient to demo increased bil LE strength to allow him to don/doff his pants independently and apply pressure to gas/brake pedals to allow return to driving.    Time 8    Period Weeks    Status On-going    Target Date 12/03/21      PT LONG TERM GOAL #4   Title Patient to demonstrate improved 5x sit to stand to <= 12 sec and TUG to <= 10 sec (with rollator) to decrease risk of falls.    Time 8    Period Weeks    Status On-going    Target Date 12/03/21      PT LONG TERM GOAL #5   Title Patient able to get in/out of bed independently at home.    Time 8    Period Weeks    Status Achieved   11/04/21   Target Date 12/03/21      PT LONG TERM GOAL #6   Title Patient able to demo increased LE strength by being able to ascend/descend his stairs at home with a reciprocal gait pattern using railing.    Time 8    Period Weeks    Status On-going    Target Date 12/03/21                   Plan - 11/04/21 1451     Clinical Impression Statement Pt has been making improvements since starting PT, noting that he is now able to get himself out of bed. We worked on more dynamic exercises for the LEs today, pt did become more fatigued. Progressed gait training, seen better foot clearance but he still needs reminders to avoid pushing his arms through the rollator too much. Pt has met LTG #5, making progress but needs continual work on strengthening to improve endurance.  Personal Factors and Comorbidities Comorbidity 3+;Time since onset of injury/illness/exacerbation;Behavior Pattern    Comorbidities T2DM, HTN, spinal fusion 4/22, heart murmur, brain bleed Jan 2020, RA, neuropathy    PT Frequency 2x / week    PT Duration 8 weeks    PT Treatment/Interventions ADLs/Self Care Home Management;Aquatic Therapy;Biofeedback;Cryotherapy;Electrical Stimulation;Moist Heat;Neuromuscular re-education;Balance  training;Therapeutic exercise;Therapeutic activities;Functional mobility training;Stair training;Gait training;Patient/family education;Manual techniques;Dry needling;Passive range of motion;Taping    PT Next Visit Plan LE strengthening (including ankles) using machines for greater resistance and cardio equipment; try NMES for left ant tib activation; balance/gait/stairs; see functional goals    PT Home Exercise Plan [id number]    Consulted and Agree with Plan of Care Patient;Family member/caregiver             Patient will benefit from skilled therapeutic intervention in order to improve the following deficits and impairments:  Abnormal gait, Decreased range of motion, Pain, Decreased activity tolerance, Decreased balance, Impaired flexibility, Decreased strength  Visit Diagnosis: Muscle weakness (generalized)  Foot drop, left  Unsteadiness on feet  Other abnormalities of gait and mobility  Chronic midline low back pain, unspecified whether sciatica present     Problem List Patient Active Problem List   Diagnosis Date Noted   Foot drop, left foot 10/02/2021   History of colonic polyps 10/02/2021   History of rheumatic fever 10/02/2021   Stress fracture of left foot 09/03/2021   Gabapentin overdose, accidental or unintentional, initial encounter 03/08/2021   TIA (transient ischemic attack) 03/05/2021   Body mass index (BMI) 36.0-36.9, adult 10/03/2020   Subdural hemorrhage following injury without open intracranial wound and with prolonged loss of consciousness (more than 24 hours) without return to pre-existing conscious level ([place]) 10/03/2020   AKI (acute kidney injury) ([place])    Acute encephalopathy    SDH (subdural hematoma) 92/08/9416   Cyclic vomiting syndrome 08/06/2019   Neuropathy 12/25/2018   AK (actinic keratosis) 08/24/2018   Neoplasm of uncertain behavior 40/81/4481   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   Tendinopathy of right gluteus medius 07/13/2018    Tendinopathy of left gluteus medius 07/13/2018   Squamous cell carcinoma in situ (SCCIS) of skin 03/24/2018   Vitamin D deficiency 11/29/2017   Stage 3 chronic kidney disease ([place]) 10/28/2017   Primary osteoarthritis of right hip 09/11/2017   Essential hypertension 07/18/2017   Hyperlipidemia associated with type 2 diabetes mellitus ([place]) 07/18/2017   Type 2 diabetes mellitus with diabetic neuropathy, unspecified ([place]) 07/18/2017   Heart murmur 07/18/2017   Hypertriglyceridemia 07/18/2017   History of stroke 07/18/2017   DDD (degenerative disc disease), lumbar 06/15/2017   Iron deficiency anemia 06/15/2017   Stroke ([place]) 06/15/2017   Encounter for hepatitis C screening test for low risk patient 06/30/2016   Microalbuminuria due to type 2 diabetes mellitus ([place]) 03/19/2016   Hypertrophy of inferior nasal turbinate 12/20/2015   Disease of nasal cavity and sinuses 12/10/2015   Degenerative tear of acetabular labrum of left hip 11/04/2015   History of tobacco abuse 05/15/2015   Morbid (severe) obesity due to excess calories ([place]) 04/11/2015   Disorder of both eustachian tubes 01/27/2015   Maxillary sinusitis 01/13/2015   Lipoprotein deficiency 01/31/2014   Gastro-esophageal reflux disease without esophagitis 11/02/2013   Rheumatic fever 11/02/2013   Erectile dysfunction associated with type 2 diabetes mellitus ([place]) 07/31/2013   Type 2 diabetes mellitus with hyperglycemia, without long-term current use of insulin ([place]) 11/16/2012    Artist Pais, PTA 11/04/2021, 3:16 PM  [facility] [address]  [place], [place], [address] Phone: [phone]   Fax:  [phone]  Name: Leroi Haque Shibata MRN: [id number] Date of Birth: June 18, 1946

## 2021-11-07 DIAGNOSIS — M4320 Fusion of spine, site unspecified: Secondary | ICD-10-CM | POA: Diagnosis not present

## 2021-11-09 ENCOUNTER — Encounter: Payer: Self-pay | Admitting: Family Medicine

## 2021-11-09 ENCOUNTER — Telehealth: Payer: Self-pay

## 2021-11-09 ENCOUNTER — Telehealth: Payer: Self-pay | Admitting: *Deleted

## 2021-11-09 ENCOUNTER — Telehealth (INDEPENDENT_AMBULATORY_CARE_PROVIDER_SITE_OTHER): Payer: Medicare HMO | Admitting: Family Medicine

## 2021-11-09 ENCOUNTER — Ambulatory Visit: Payer: Medicare HMO | Admitting: Physical Therapy

## 2021-11-09 DIAGNOSIS — E1169 Type 2 diabetes mellitus with other specified complication: Secondary | ICD-10-CM | POA: Diagnosis not present

## 2021-11-09 DIAGNOSIS — H524 Presbyopia: Secondary | ICD-10-CM | POA: Diagnosis not present

## 2021-11-09 DIAGNOSIS — E785 Hyperlipidemia, unspecified: Secondary | ICD-10-CM

## 2021-11-09 DIAGNOSIS — H25813 Combined forms of age-related cataract, bilateral: Secondary | ICD-10-CM | POA: Diagnosis not present

## 2021-11-09 DIAGNOSIS — Z7984 Long term (current) use of oral hypoglycemic drugs: Secondary | ICD-10-CM | POA: Diagnosis not present

## 2021-11-09 DIAGNOSIS — E119 Type 2 diabetes mellitus without complications: Secondary | ICD-10-CM | POA: Diagnosis not present

## 2021-11-09 DIAGNOSIS — I10 Essential (primary) hypertension: Secondary | ICD-10-CM | POA: Diagnosis not present

## 2021-11-09 DIAGNOSIS — I350 Nonrheumatic aortic (valve) stenosis: Secondary | ICD-10-CM | POA: Diagnosis not present

## 2021-11-09 DIAGNOSIS — H5213 Myopia, bilateral: Secondary | ICD-10-CM | POA: Diagnosis not present

## 2021-11-09 LAB — HM DIABETES EYE EXAM

## 2021-11-09 MED ORDER — HYDROCHLOROTHIAZIDE 25 MG PO TABS: 25.0000 mg | ORAL_TABLET | Freq: Every day | ORAL | 3 refills | Status: DC

## 2021-11-09 NOTE — Chronic Care Management (AMB) (Signed)
°  Chronic Care Management   Outreach Note  11/09/2021 Name: Tommy Green MRN: [id number] DOB: 1946-04-08  Tommy Spies Fromme is a 76 y.o. year old male who is a primary care patient of [name], DO. I reached out to [employer] by phone today in response to a referral sent by [name]'s primary care provider.  An unsuccessful telephone outreach was attempted today. The patient was referred to the case management team for assistance with care management and care coordination.   Follow Up Plan: A HIPAA compliant phone message was left for the patient providing contact information and requesting a return call.  If patient returns call to provider office, please advise to call Embedded Care Management Care Guide Yvan Dority at [place], [place] [facility]  Direct Dial: [phone]

## 2021-11-09 NOTE — Telephone Encounter (Signed)
Pt's Spouse called this morning stating pt is wanting something else called in because the carvedilol is causing the patient's caps to fall off of his teeth and causing excessive thirst and dry mouth.  Please advise.

## 2021-11-09 NOTE — Chronic Care Management (AMB) (Signed)
Chronic Care Management   Note  11/09/2021 Name: Tommy Green MRN: [id number] DOB: Jan 02, 1946  Tommy Green is a 76 y.o. year old male who is a primary care patient of [name], DO. I reached out to [employer] by phone today in response to a referral sent by Mr. Tommy Spies Abruzzese's PCP.  [name] was given information about Chronic Care Management services today including:  CCM service includes personalized support from designated clinical staff supervised by his physician, including individualized plan of care and coordination with other care providers 24/7 contact phone numbers for assistance for urgent and routine care needs. Service will only be billed when office clinical staff spend 20 minutes or more in a month to coordinate care. Only one practitioner may furnish and bill the service in a calendar month. The patient may stop CCM services at any time (effective at the end of the month) by phone call to the office staff. The patient is responsible for co-pay (up to 20% after annual deductible is met) if co-pay is required by the individual health plan.   Patient agreed to services and verbal consent obtained.   Follow up plan: Telephone appointment with care management team member scheduled for: 11/25/2021  Julian Hy, [place] [facility]  Direct Dial: [phone]

## 2021-11-09 NOTE — Telephone Encounter (Signed)
Scheduled video visit this am at 11:45.

## 2021-11-09 NOTE — Progress Notes (Signed)
CC: Med AE  Subjective: Patient is a 76 y.o. male here for F/u. Due to COVID-19 pandemic, we are interacting via telephone. I verified patient's ID using 2 identifiers. Patient agreed to proceed with visit via this method. Patient is at home, I am at office. Patient, his spouse Tommy Green, and I are present for visit.   Pt on Coreg for HTN. Has a hx of severe AS, cards suggested TAVR, but pt not good candidate, politely declined as he is asymptomatic at this time. Has been having dry mouth and losing caps from teeth. Dentist thinks it is a med causing issue. Wife thinks it may be Coreg as this is new over the past year.   Past Medical History:  Diagnosis Date   Anemia    Essential hypertension 07/18/2017   Foot drop, left foot    due to back surgery in 01/2021   History of colonic polyps    History of rheumatic fever    History of stroke 07/18/2017   Hypertriglyceridemia 07/18/2017   Type 2 diabetes mellitus with diabetic neuropathy, unspecified ([place]) 07/18/2017    Objective: No conversational dyspnea Age appropriate judgment and insight Nml affect and mood  Assessment and Plan: Essential hypertension - Plan: AMB Referral to [facility]  Aortic valve stenosis, etiology of cardiac valve disease unspecified - Plan: AMB Referral to [facility]  Hyperlipidemia associated with type 2 diabetes mellitus ([place])  Stop Coreg, start HCTZ. No indication for BB at this time. If no improvement, will consider sending [place]. Refer to [facility], hopefully pharmacy can ensure nothing else needs to be done.  Total time: 11 min The patient and his spouse voiced understanding and agreement to the plan.  [place], DO 11/09/21  12:02 PM

## 2021-11-10 DIAGNOSIS — E1142 Type 2 diabetes mellitus with diabetic polyneuropathy: Secondary | ICD-10-CM | POA: Diagnosis not present

## 2021-11-10 DIAGNOSIS — S90425S Blister (nonthermal), left lesser toe(s), sequela: Secondary | ICD-10-CM | POA: Diagnosis not present

## 2021-11-10 DIAGNOSIS — M21372 Foot drop, left foot: Secondary | ICD-10-CM | POA: Diagnosis not present

## 2021-11-17 ENCOUNTER — Ambulatory Visit: Payer: Medicare HMO | Admitting: Rheumatology

## 2021-11-17 ENCOUNTER — Encounter: Payer: Self-pay | Admitting: Rheumatology

## 2021-11-17 ENCOUNTER — Other Ambulatory Visit: Payer: Self-pay

## 2021-11-17 VITALS — BP 147/79 | HR 80 | Ht 70.5 in | Wt 224.0 lb

## 2021-11-17 DIAGNOSIS — M160 Bilateral primary osteoarthritis of hip: Secondary | ICD-10-CM | POA: Diagnosis not present

## 2021-11-17 DIAGNOSIS — R011 Cardiac murmur, unspecified: Secondary | ICD-10-CM | POA: Diagnosis not present

## 2021-11-17 DIAGNOSIS — I10 Essential (primary) hypertension: Secondary | ICD-10-CM | POA: Diagnosis not present

## 2021-11-17 DIAGNOSIS — M5136 Other intervertebral disc degeneration, lumbar region: Secondary | ICD-10-CM

## 2021-11-17 DIAGNOSIS — L920 Granuloma annulare: Secondary | ICD-10-CM

## 2021-11-17 DIAGNOSIS — E1140 Type 2 diabetes mellitus with diabetic neuropathy, unspecified: Secondary | ICD-10-CM

## 2021-11-17 DIAGNOSIS — M1712 Unilateral primary osteoarthritis, left knee: Secondary | ICD-10-CM

## 2021-11-17 DIAGNOSIS — Z79899 Other long term (current) drug therapy: Secondary | ICD-10-CM

## 2021-11-17 DIAGNOSIS — G8929 Other chronic pain: Secondary | ICD-10-CM

## 2021-11-17 DIAGNOSIS — Z794 Long term (current) use of insulin: Secondary | ICD-10-CM

## 2021-11-17 DIAGNOSIS — S065XAA Traumatic subdural hemorrhage with loss of consciousness status unknown, initial encounter: Secondary | ICD-10-CM

## 2021-11-17 DIAGNOSIS — Z8673 Personal history of transient ischemic attack (TIA), and cerebral infarction without residual deficits: Secondary | ICD-10-CM

## 2021-11-17 DIAGNOSIS — M0579 Rheumatoid arthritis with rheumatoid factor of multiple sites without organ or systems involvement: Secondary | ICD-10-CM | POA: Diagnosis not present

## 2021-11-17 DIAGNOSIS — M25511 Pain in right shoulder: Secondary | ICD-10-CM

## 2021-11-17 DIAGNOSIS — E785 Hyperlipidemia, unspecified: Secondary | ICD-10-CM

## 2021-11-17 DIAGNOSIS — D049 Carcinoma in situ of skin, unspecified: Secondary | ICD-10-CM

## 2021-11-17 DIAGNOSIS — M24521 Contracture, right elbow: Secondary | ICD-10-CM | POA: Diagnosis not present

## 2021-11-17 DIAGNOSIS — E1169 Type 2 diabetes mellitus with other specified complication: Secondary | ICD-10-CM

## 2021-11-17 LAB — CBC WITH DIFFERENTIAL/PLATELET
Absolute Monocytes: 827 cells/uL (ref 200–950)
Basophils Absolute: 70 cells/uL (ref 0–200)
Basophils Relative: 0.8 %
Eosinophils Absolute: 374 cells/uL (ref 15–500)
Eosinophils Relative: 4.3 %
HCT: 33.2 % — ABNORMAL LOW (ref 38.5–50.0)
Hemoglobin: 10.6 g/dL — ABNORMAL LOW (ref 13.2–17.1)
Lymphs Abs: 2523 cells/uL (ref 850–3900)
MCH: 25.5 pg — ABNORMAL LOW (ref 27.0–33.0)
MCHC: 31.9 g/dL — ABNORMAL LOW (ref 32.0–36.0)
MCV: 80 fL (ref 80.0–100.0)
MPV: 10.6 fL (ref 7.5–12.5)
Monocytes Relative: 9.5 %
Neutro Abs: 4907 cells/uL (ref 1500–7800)
Neutrophils Relative %: 56.4 %
Platelets: 298 10*3/uL (ref 140–400)
RBC: 4.15 10*6/uL — ABNORMAL LOW (ref 4.20–5.80)
RDW: 14.4 % (ref 11.0–15.0)
Total Lymphocyte: 29 %
WBC: 8.7 10*3/uL (ref 3.8–10.8)

## 2021-11-17 LAB — COMPLETE METABOLIC PANEL WITH GFR
AG Ratio: 1.6 (calc) (ref 1.0–2.5)
ALT: 9 U/L (ref 9–46)
AST: 15 U/L (ref 10–35)
Albumin: 4 g/dL (ref 3.6–5.1)
Alkaline phosphatase (APISO): 41 U/L (ref 35–144)
BUN/Creatinine Ratio: 21 (calc) (ref 6–22)
BUN: 36 mg/dL — ABNORMAL HIGH (ref 7–25)
CO2: 23 mmol/L (ref 20–32)
Calcium: 9.4 mg/dL (ref 8.6–10.3)
Chloride: 104 mmol/L (ref 98–110)
Creat: 1.72 mg/dL — ABNORMAL HIGH (ref 0.70–1.28)
Globulin: 2.5 g/dL (calc) (ref 1.9–3.7)
Glucose, Bld: 192 mg/dL — ABNORMAL HIGH (ref 65–99)
Potassium: 4.4 mmol/L (ref 3.5–5.3)
Sodium: 138 mmol/L (ref 135–146)
Total Bilirubin: 0.3 mg/dL (ref 0.2–1.2)
Total Protein: 6.5 g/dL (ref 6.1–8.1)
eGFR: 40 mL/min/{1.73_m2} — ABNORMAL LOW (ref >=60)

## 2021-11-17 NOTE — Patient Instructions (Addendum)
Standing Labs We placed an order today for your standing lab work.   Please have your standing labs drawn in April and every 3 months  If possible, please have your labs drawn 2 weeks prior to your appointment so that the provider can discuss your results at your appointment.  Please note that you may see your imaging and lab results in [facility] before we have reviewed them. We may be awaiting multiple results to interpret others before contacting you. Please allow our office up to 72 hours to thoroughly review all of the results before contacting the office for clarification of your results.  We have open lab daily: Monday through Thursday from 1:30-4:30 PM and Friday from 1:30-4:00 PM at the office of Dr. Bo Merino, [facility].   Please be advised, all patients with office appointments requiring lab work will take precedent over walk-in lab work.  If possible, please come for your lab work on Monday and Friday afternoons, as you may experience shorter wait times. The office is located at [address], [place], [place], [place] [address] No appointment is necessary.   Labs are drawn by [facility]. Please bring your co-pay at the time of your lab draw.  You may receive a bill from [facility] for your lab work.  Please note if you are on Hydroxychloroquine and and an order has been placed for a Hydroxychloroquine level, you will need to have it drawn 4 hours or more after your last dose.  If you wish to have your labs drawn at another location, please call the office 24 hours in advance to send orders.  If you have any questions regarding directions or hours of operation,  please call [phone].   As a reminder, please drink plenty of water prior to coming for your lab work. Thanks!  Vaccines You are taking a medication(s) that can suppress your immune system.  The following immunizations are recommended: Flu annually Covid-19  Td/Tdap (tetanus, diphtheria, pertussis)  every 10 years Pneumonia (Prevnar 15 then Pneumovax 23 at least 1 year apart.  Alternatively, can take Prevnar 20 without needing additional dose) Shingrix: 2 doses from 4 weeks to 6 months apart  Please check with your PCP to make sure you are up to date.   If you have signs or symptoms of an infection or start antibiotics: First, call your PCP for workup of your infection. Hold your medication through the infection, until you complete your antibiotics, and until symptoms resolve if you take the following: Injectable medication (Actemra, Benlysta, Cimzia, Cosentyx, Enbrel, Humira, Kevzara, Orencia, Remicade, Simponi, Stelara, Taltz, Tremfya) Methotrexate Leflunomide (Arava) Mycophenolate (Cellcept) Roma Kayser, or Rinvoq  Please get annual skin examination by a dermatologist to screen for skin cancer while you are on Enbrel  Heart Disease Prevention   Your inflammatory disease increases your risk of heart disease which includes heart attack, stroke, atrial fibrillation (irregular heartbeats), high blood pressure, heart failure and atherosclerosis (plaque in the arteries).  It is important to reduce your risk by:   Keep blood pressure, cholesterol, and blood sugar at healthy levels   Smoking Cessation   Maintain a healthy weight  BMI 20-25   Eat a healthy diet  Plenty of fresh fruit, vegetables, and whole grains  Limit saturated fats, foods high in sodium, and added sugars  DASH and Mediterranean diet   Increase physical activity  Recommend moderate physically activity for 150 minutes per week/ 30 minutes a day for five days a week These can  be broken up into three separate ten-minute sessions during the day.   Reduce Stress  Meditation, slow breathing exercises, yoga, coloring books  Dental visits twice a year

## 2021-11-18 ENCOUNTER — Telehealth: Payer: Self-pay | Admitting: Pharmacist

## 2021-11-18 NOTE — Progress Notes (Signed)
Glucose is elevated.  Creatinine is elevated, most likely due to the use of diuretics.  Hemoglobin is low and stable.  Please forward labs to his PCP.

## 2021-11-18 NOTE — Chronic Care Management (AMB) (Signed)
Chronic Care Management Pharmacy Assistant   Name: Tommy Green  MRN: [id number] DOB: [birth year]  Tommy Green is an 76 y.o. year old male who presents for his initial CCM visit with the clinical pharmacist.  Recent office visits:  11/09/21-Tommy Nolene Ebbs, DO (PCP, Video Visit) Seen for hypertension. AMB Referral to [facility]. Stop Coreg, Start HCTZ. 08/31/21-Tommy Nolene Ebbs, DO (PCP) Seen for Edema. Ambulatory referral to Sports Medicine. Ambulatory referral to Cardiology. Follow up visit prn.  Recent consult visits:  11/17/21-(Rheumatology) Tommy Merino, MD. Medication management visit. Labs ordered. Follow up in 5 months.  11/10/21-(Podiatry) Tommy Green, DPM  10/29/21-(Podiatry) Tommy Green, DPM. Seen for diabetic foot exam. 10/20/21-(Cardiology) Tommy Priest, MD. Echo cardiogram ordered. Follow up in 6 months. 09/17/21-(Sports Medicine) Tommy Ax, MD. Follow up for his left foot pain. 09/08/21-(Dermatology) Tommy Green. Notes not available. 09/04/21-(Gastroenterology) Tommy Piggs PA-C. Seen for a medication problem. 09/03/21-(Sport Medicine) Tommy Ax, MD. Seen for left foot pain. Follow up in 2-3 weeks. 08/10/21-Tommy Avila-Duran, PA. Notes not available. 07/31/21-(Gastroenterology) Tommy Dunk, PA-C. Consult visit. Start on Cipro 500mg  BID x 10 days. 07/13/21-Tommy Avila-Duran, PA. Notes not available. 07/08/21-(Dermatology) Tommy Green. Notes not available.  03/23/21-(Dermatology) Tommy Green. Notes not available.  06/16/21-(Rheumatology) Tommy Green. Tommy Skye, PA-C. Seen for medication monitoring. Labs ordered. Follow up in 5 months. 05/25/21-Tommy Avila-Duran, PA. Notes not available.  Hospital visits:  None in previous 6 months  Medications: Outpatient Encounter Medications as of 11/18/2021  Medication Sig   acetaminophen (TYLENOL) 325 MG tablet Take 650 mg by mouth every 6  (six) hours as needed (for pain).   amLODipine (NORVASC) 5 MG tablet Take 1 tablet (5 mg total) by mouth daily.   B Complex Vitamins (B COMPLEX PO) Take 1 tablet by mouth daily.   Cholecalciferol (VITAMIN D3 SUPER STRENGTH) 50 MCG (2000 UT) CAPS Take 2,000 Units by mouth daily.   Etanercept (ENBREL MINI) 50 MG/ML SOCT Inject 50 mg into the skin once a week.   fenofibrate micronized (LOFIBRA) 200 MG capsule TAKE 1 CAPSULE DAILY BEFOREBREAKFAST   gabapentin (NEURONTIN) 100 MG capsule TAKE 2 CAPSULES AT BEDTIME   hydrochlorothiazide (HYDRODIURIL) 25 MG tablet Take 1 tablet (25 mg total) by mouth daily.   insulin degludec (TRESIBA) 100 UNIT/ML FlexTouch Pen Inject 30 Units into the skin at bedtime.   insulin lispro (HUMALOG) 100 UNIT/ML injection Inject 0-10 Units into the skin See admin instructions. Inject 0-10 units into the skin before meals, PER SLIDING SCALE: BGL 0-200 = give nothing, 201-250 = 2 units, 251-300 = 4 units, 301-350 = 6 units, 351-400 = 8 units, >400 = 10 units, push fluids, and repeat BGL check in 2 hours. If BGL remains >400 after 2 hours, CALL MD.   leflunomide (ARAVA) 10 MG tablet TAKE 1 TABLET BY MOUTH EVERY DAY   lisinopril (ZESTRIL) 20 MG tablet Take 1 tablet (20 mg total) by mouth daily.   Magnesium 200 MG TABS Take 200 mg by mouth daily in the afternoon.   Multiple Vitamins-Minerals (MULTIVITAMIN WITH MINERALS) tablet Take 1 tablet by mouth daily.   omega-3 acid ethyl esters (LOVAZA) 1 g capsule Take 1 capsule (1 g total) by mouth 2 (two) times daily.   omeprazole (PRILOSEC) 20 MG capsule Take 1 capsule (20 mg total) by mouth daily before breakfast.   ONETOUCH VERIO test strip 4 (four) times daily.   Potassium 99 MG TABS Take 99 mg by mouth daily.  vitamin B-12 (CYANOCOBALAMIN) 1000 MCG tablet Take 1,000 mcg by mouth daily.   No facility-administered encounter medications on file as of 11/18/2021.   Acetaminophen (TYLENOL) 325 MG tablet Last filled:None noted AmLODipine  (NORVASC) 5 MG tablet Last filled:10/26/21 90 DS B Complex Vitamins (B COMPLEX PO) Last filled:None noted Cholecalciferol (VITAMIN D3 SUPER STRENGTH) 50 MCG (2000 UT) CAPS Last filled:None noted Etanercept (ENBREL MINI) 50 MG/ML SOCT Last filled:None noted Fenofibrate micronized (LOFIBRA) 200 MG capsule Last filled:09/22/21 90 DS Gabapentin (NEURONTIN) 100 MG capsule Last filled:07/13/21 90 DS Hydrochlorothiazide (HYDRODIURIL) 25 MG tablet Last filled:11/09/21 90 DS Insulin degludec (TRESIBA) 100 UNIT/ML FlexTouch Pen Last filled:None noted Insulin lispro (HUMALOG) 100 UNIT/ML injection Last filled:None noted Leflunomide (ARAVA) 10 MG tablet Last filled:09/17/21 90 DS Lisinopril (ZESTRIL) 20 MG tablet Last filled:07/03/21 90 DS Magnesium 200 MG TABS Last filled:None noted Omega-3 acid ethyl esters (LOVAZA) 1 g capsule Last filled:None noted Omeprazole (PRILOSEC) 20 MG capsule Last filled:10/18/21 90 DS Potassium 99 MG TABS Last filled:None noted Vitamin B-12 (CYANOCOBALAMIN) 1000 MCG tablet Last filled:None noted   Care Gaps: COVID-19 Vaccine:Overdue since 10/03/2020  Star Rating Drugs: Lisinopril (ZESTRIL) 20 MG tablet Last filled:07/03/21 90 DS  Tommy Green, Tommy Green

## 2021-11-23 DIAGNOSIS — M21372 Foot drop, left foot: Secondary | ICD-10-CM | POA: Diagnosis not present

## 2021-11-23 DIAGNOSIS — I10 Essential (primary) hypertension: Secondary | ICD-10-CM | POA: Diagnosis not present

## 2021-11-23 DIAGNOSIS — Z6835 Body mass index (BMI) 35.0-35.9, adult: Secondary | ICD-10-CM | POA: Diagnosis not present

## 2021-11-23 DIAGNOSIS — M4325 Fusion of spine, thoracolumbar region: Secondary | ICD-10-CM | POA: Diagnosis not present

## 2021-11-24 ENCOUNTER — Other Ambulatory Visit: Payer: Self-pay

## 2021-11-24 ENCOUNTER — Ambulatory Visit: Payer: Medicare HMO | Attending: Family Medicine

## 2021-11-24 DIAGNOSIS — M6281 Muscle weakness (generalized): Secondary | ICD-10-CM | POA: Insufficient documentation

## 2021-11-24 DIAGNOSIS — R2689 Other abnormalities of gait and mobility: Secondary | ICD-10-CM | POA: Diagnosis not present

## 2021-11-24 DIAGNOSIS — M545 Low back pain, unspecified: Secondary | ICD-10-CM | POA: Insufficient documentation

## 2021-11-24 DIAGNOSIS — M21372 Foot drop, left foot: Secondary | ICD-10-CM | POA: Insufficient documentation

## 2021-11-24 DIAGNOSIS — G8929 Other chronic pain: Secondary | ICD-10-CM | POA: Insufficient documentation

## 2021-11-24 DIAGNOSIS — R2681 Unsteadiness on feet: Secondary | ICD-10-CM | POA: Diagnosis not present

## 2021-11-24 NOTE — Therapy (Signed)
[facility] [address]  [place], [place], [address] Phone: [phone]   Fax:  [phone]  Physical Therapy Treatment  Patient Details  Name: Tommy Green MRN: [id number] Date of Birth: 1946-02-24 Referring Provider (PT): Riki Sheer, [place]   Encounter Date: 11/24/2021   PT End of Session - 11/24/21 1441     Visit Number 8    Number of Visits 16    Date for PT Re-Evaluation 12/03/21    Authorization Type [employer] MCR    Progress Note Due on Visit 10    PT Start Time 1400    PT Stop Time 9242    PT Time Calculation (min) 42 min    Activity Tolerance Patient tolerated treatment well;Patient limited by fatigue    Behavior During Therapy [facility] for tasks assessed/performed             Past Medical History:  Diagnosis Date   Anemia    Essential hypertension 07/18/2017   Foot drop, left foot    due to back surgery in 01/2021   History of colonic polyps    History of rheumatic fever    History of stroke 07/18/2017   Hypertriglyceridemia 07/18/2017   Type 2 diabetes mellitus with diabetic neuropathy, unspecified ([place]) 07/18/2017    Past Surgical History:  Procedure Laterality Date   COLONOSCOPY     around 2018 [facility]   COLONOSCOPY  09/02/2020   CRANIOTOMY Left 10/25/2019   Procedure: CRANIOTOMY HEMATOMA EVACUATION SUBDURAL;  Surgeon: Vallarie Mare, MD;  Location: [facility];  Service: Neurosurgery;  Laterality: Left;   ESOPHAGOGASTRODUODENOSCOPY     around 2018 with [facility]   FOOT CAPSULOTOMY Left 07/25/2008   Mid Foot #2 MPJ   Hammertoe Repair Left 07/25/2008   #2 toe   SPINAL FUSION  01/2021   TARSAL TUNNEL RELEASE Left 07/25/2008   UPPER GASTROINTESTINAL ENDOSCOPY  09/02/2020    There were no vitals filed for this visit.   Subjective Assessment - 11/24/21 1405     Subjective Pt reports that he has missed 2 weeks of PT, left the little device for my leg here the other week and  haven't been able to use it. Been having more pain lately due to my L toes.    Pertinent History T2DM, HTN, spinal fusion 4/22, heart murmur, brain bleed Jan 2020, RA    Currently in Pain? Yes    Pain Score 6     Pain Location Foot    Pain Orientation Left                [facility] PT Assessment - 11/24/21 0001       Strength   Right Hip Flexion 4-/5    Right Hip ABduction 4+/5    Right Hip ADduction 4/5    Left Hip Flexion 4-/5    Left Hip ABduction 4+/5    Left Hip ADduction 4/5    Right Knee Flexion 4+/5    Right Knee Extension 4+/5    Left Knee Flexion 4+/5    Left Knee Extension 4+/5                           [facility] Adult PT Treatment/Exercise - 11/24/21 0001       Knee/Hip Exercises: Aerobic   Nustep L4x22min      Knee/Hip Exercises: Seated   Long Arc Quad Strengthening;Both;10 reps  Ball Squeeze 12x5"    Marching Strengthening;Both;10 reps      Ankle Exercises: Seated   Toe Raise 15 reps   AAROM with strap and with NMES                    PT Education - 11/24/21 1438     Education Details edu on NMES unit, use, electrode placement, settings and had pt return demonstration of instructions.    Person(s) Educated Patient    Methods Explanation;Demonstration;Handout;Verbal cues    Comprehension Verbalized understanding;Returned demonstration;Need further instruction              PT Short Term Goals - 11/24/21 1444       PT SHORT TERM GOAL #1   Title Ind and compliant with initial HEP    Time 4    Period Weeks    Status Achieved   2/7   Target Date 11/05/21      PT SHORT TERM GOAL #2   Title Improved 5x sit to stand to <= 15 sec    Time 4    Period Weeks    Status On-going   unable to test today due to L foot pain     PT SHORT TERM GOAL #3   Title Improved TUG to <= 16 sec    Time 4    Period Weeks    Status On-going   unable to test today due to L foot pain              PT Long Term Goals - 11/24/21 1418        PT LONG TERM GOAL #1   Title Ind and compliant with HEP to maintain therapy gains    Time 8    Period Weeks    Status On-going    Target Date 12/03/21      PT LONG TERM GOAL #2   Title Patient able to safely ambulate at home in community with least restrictive AD and a normal gait pattern including good heel strike bil (with or without AFO).    Time 8    Period Weeks    Status On-going    Target Date 12/03/21      PT LONG TERM GOAL #3   Title Patient to demo increased bil LE strength to allow him to don/doff his pants independently and apply pressure to gas/brake pedals to allow return to driving.    Time 8    Period Weeks    Status On-going   2/7- see flowsheet   Target Date 12/03/21      PT LONG TERM GOAL #4   Title Patient to demonstrate improved 5x sit to stand to <= 12 sec and TUG to <= 10 sec (with rollator) to decrease risk of falls.    Time 8    Period Weeks    Status On-going    Target Date 12/03/21      PT LONG TERM GOAL #5   Title Patient able to get in/out of bed independently at home.    Time 8    Period Weeks    Status Achieved   11/04/21   Target Date 12/03/21      PT LONG TERM GOAL #6   Title Patient able to demo increased LE strength by being able to ascend/descend his stairs at home with a reciprocal gait pattern using railing.    Time 8    Period Weeks    Status On-going  Target Date 12/03/21                   Plan - 11/24/21 1445     Clinical Impression Statement We were unable to assess STGs today due to increased L foot pain, which has caused the patient to miss 2 weeks of therapy. He stated that one of his toes split across the front while wearing his AFO and has been in pain ever since. Did test strength, showing some improvement in LE strength but still with some medial and anterior hip weakness. PT did some education on NMES unit, giving instruction on application and use to allow pt use at home.    Personal Factors and  Comorbidities Comorbidity 3+;Time since onset of injury/illness/exacerbation;Behavior Pattern    Comorbidities T2DM, HTN, spinal fusion 4/22, heart murmur, brain bleed Jan 2020, RA, neuropathy    PT Frequency 2x / week    PT Duration 8 weeks    PT Treatment/Interventions ADLs/Self Care Home Management;Aquatic Therapy;Biofeedback;Cryotherapy;Electrical Stimulation;Moist Heat;Neuromuscular re-education;Balance training;Therapeutic exercise;Therapeutic activities;Functional mobility training;Stair training;Gait training;Patient/family education;Manual techniques;Dry needling;Passive range of motion;Taping    PT Next Visit Plan LE strengthening (including ankles) using machines for greater resistance and cardio equipment; balance/gait/stairs; see functional goals    PT Home Exercise Plan [email or web address]    Consulted and Agree with Plan of Care Patient             Patient will benefit from skilled therapeutic intervention in order to improve the following deficits and impairments:  Abnormal gait, Decreased range of motion, Pain, Decreased activity tolerance, Decreased balance, Impaired flexibility, Decreased strength  Visit Diagnosis: Muscle weakness (generalized)  Foot drop, left  Unsteadiness on feet  Other abnormalities of gait and mobility  Chronic midline low back pain, unspecified whether sciatica present     Problem List Patient Active Problem List   Diagnosis Date Noted   Foot drop, left foot 10/02/2021   History of colonic polyps 10/02/2021   History of rheumatic fever 10/02/2021   Stress fracture of left foot 09/03/2021   Gabapentin overdose, accidental or unintentional, initial encounter 03/08/2021   TIA (transient ischemic attack) 03/05/2021   Body mass index (BMI) 36.0-36.9, adult 10/03/2020   Subdural hemorrhage following injury without open intracranial wound and with prolonged loss of consciousness (more than 24 hours) without return to pre-existing conscious level ([place])  10/03/2020   AKI (acute kidney injury) ([place])    Acute encephalopathy    SDH (subdural hematoma) 29/51/8841   Cyclic vomiting syndrome 08/06/2019   Neuropathy 12/25/2018   AK (actinic keratosis) 08/24/2018   Neoplasm of uncertain behavior 66/03/3015   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   Tendinopathy of right gluteus medius 07/13/2018   Tendinopathy of left gluteus medius 07/13/2018   Squamous cell carcinoma in situ (SCCIS) of skin 03/24/2018   Vitamin D deficiency 11/29/2017   Stage 3 chronic kidney disease ([place]) 10/28/2017   Primary osteoarthritis of right hip 09/11/2017   Essential hypertension 07/18/2017   Hyperlipidemia associated with type 2 diabetes mellitus ([place]) 07/18/2017   Type 2 diabetes mellitus with diabetic neuropathy, unspecified ([place]) 07/18/2017   Heart murmur 07/18/2017   Hypertriglyceridemia 07/18/2017   History of stroke 07/18/2017   DDD (degenerative disc disease), lumbar 06/15/2017   Iron deficiency anemia 06/15/2017   Stroke ([place]) 06/15/2017   Encounter for hepatitis C screening test for low risk patient 06/30/2016   Microalbuminuria due to type 2 diabetes mellitus ([place]) 03/19/2016   Hypertrophy of inferior nasal turbinate 12/20/2015   Disease of  nasal cavity and sinuses 12/10/2015   Degenerative tear of acetabular labrum of left hip 11/04/2015   History of tobacco abuse 05/15/2015   Morbid (severe) obesity due to excess calories ([place]) 04/11/2015   Disorder of both eustachian tubes 01/27/2015   Maxillary sinusitis 01/13/2015   Lipoprotein deficiency 01/31/2014   Gastro-esophageal reflux disease without esophagitis 11/02/2013   Rheumatic fever 11/02/2013   Erectile dysfunction associated with type 2 diabetes mellitus ([place]) 07/31/2013   Type 2 diabetes mellitus with hyperglycemia, without long-term current use of insulin ([place]) 11/16/2012    Artist Pais, PTA 11/24/2021, 6:08 PM  [facility] [address]  [place], [place], [address] Phone: [phone]   Fax:  [phone]  Name: Kipton Skillen Doggett MRN: [id number] Date of Birth: 1946-01-24

## 2021-11-25 ENCOUNTER — Ambulatory Visit (INDEPENDENT_AMBULATORY_CARE_PROVIDER_SITE_OTHER): Payer: Medicare HMO | Admitting: Pharmacist

## 2021-11-25 DIAGNOSIS — E1140 Type 2 diabetes mellitus with diabetic neuropathy, unspecified: Secondary | ICD-10-CM

## 2021-11-25 DIAGNOSIS — I10 Essential (primary) hypertension: Secondary | ICD-10-CM

## 2021-11-25 DIAGNOSIS — Z794 Long term (current) use of insulin: Secondary | ICD-10-CM

## 2021-11-25 DIAGNOSIS — E785 Hyperlipidemia, unspecified: Secondary | ICD-10-CM

## 2021-11-25 DIAGNOSIS — R682 Dry mouth, unspecified: Secondary | ICD-10-CM

## 2021-11-25 DIAGNOSIS — E1169 Type 2 diabetes mellitus with other specified complication: Secondary | ICD-10-CM

## 2021-11-25 MED ORDER — EZETIMIBE 10 MG PO TABS: 10.0000 mg | ORAL_TABLET | Freq: Every day | ORAL | 3 refills | Status: DC

## 2021-11-26 ENCOUNTER — Other Ambulatory Visit: Payer: Self-pay

## 2021-11-26 ENCOUNTER — Ambulatory Visit: Payer: Medicare HMO

## 2021-11-26 DIAGNOSIS — M545 Low back pain, unspecified: Secondary | ICD-10-CM | POA: Diagnosis not present

## 2021-11-26 DIAGNOSIS — M21372 Foot drop, left foot: Secondary | ICD-10-CM

## 2021-11-26 DIAGNOSIS — M6281 Muscle weakness (generalized): Secondary | ICD-10-CM | POA: Diagnosis not present

## 2021-11-26 DIAGNOSIS — R2681 Unsteadiness on feet: Secondary | ICD-10-CM | POA: Diagnosis not present

## 2021-11-26 DIAGNOSIS — R2689 Other abnormalities of gait and mobility: Secondary | ICD-10-CM | POA: Diagnosis not present

## 2021-11-26 DIAGNOSIS — G8929 Other chronic pain: Secondary | ICD-10-CM | POA: Diagnosis not present

## 2021-11-26 NOTE — Therapy (Signed)
[facility] [address]  [place], [place], [address] Phone: [phone]   Fax:  [phone]  Physical Therapy Treatment  Patient Details  Name: Tommy Green MRN: [id number] Date of Birth: Feb 19, 1946 Referring Provider (PT): Riki Sheer, [place]   Encounter Date: 11/26/2021   PT End of Session - 11/26/21 1549     Visit Number 9    Number of Visits 16    Date for PT Re-Evaluation 12/03/21    Authorization Type [employer] MCR    Progress Note Due on Visit 10    PT Start Time 7482    PT Stop Time 7078    PT Time Calculation (min) 41 min    Activity Tolerance Patient tolerated treatment well;Patient limited by fatigue    Behavior During Therapy [facility] for tasks assessed/performed             Past Medical History:  Diagnosis Date   Anemia    Essential hypertension 07/18/2017   Foot drop, left foot    due to back surgery in 01/2021   History of colonic polyps    History of rheumatic fever    History of stroke 07/18/2017   Hypertriglyceridemia 07/18/2017   Type 2 diabetes mellitus with diabetic neuropathy, unspecified ([place]) 07/18/2017    Past Surgical History:  Procedure Laterality Date   COLONOSCOPY     around 2018 [facility]   COLONOSCOPY  09/02/2020   CRANIOTOMY Left 10/25/2019   Procedure: CRANIOTOMY HEMATOMA EVACUATION SUBDURAL;  Surgeon: Vallarie Mare, MD;  Location: [place] [facility];  Service: Neurosurgery;  Laterality: Left;   ESOPHAGOGASTRODUODENOSCOPY     around 2018 with [facility]   FOOT CAPSULOTOMY Left 07/25/2008   Mid Foot #2 MPJ   Hammertoe Repair Left 07/25/2008   #2 toe   SPINAL FUSION  01/2021   TARSAL TUNNEL RELEASE Left 07/25/2008   UPPER GASTROINTESTINAL ENDOSCOPY  09/02/2020    There were no vitals filed for this visit.   Subjective Assessment - 11/26/21 1409     Subjective "My foot is doing better, still not confident to wear shoes yet."    Pertinent History T2DM, HTN,  spinal fusion 4/22, heart murmur, brain bleed Jan 2020, RA    Currently in Pain? Yes    Pain Score 4     Pain Location Foot    Pain Orientation Left    Pain Descriptors / Indicators Sore    Pain Type Acute pain                [facility] PT Assessment - 11/26/21 0001       Timed Up and Go Test   Normal TUG (seconds) 28.3                           [facility] Adult PT Treatment/Exercise - 11/26/21 0001       Transfers   Comments 5x STS, cues needed to increase fwd WS to improve ability to improve STS ability     Knee/Hip Exercises: Aerobic   Nustep L6x76min      Knee/Hip Exercises: Standing   Hip Abduction Stengthening;Both;10 reps;Knee straight    Hip Extension Stengthening;Both;10 reps;Knee straight    Other Standing Knee Exercises standing w/o UE support raising one arm at a time x 10 reps  Gait training: 2x90 ft with rollator     Knee/Hip Exercises: Seated   Long Arc Quad AROM;Strengthening;Both;15 reps  Marching AROM;Strengthening;Both;15 reps    Hamstring Curl Strengthening;Both;10 reps    Hamstring Limitations yellow TB                       PT Short Term Goals - 11/26/21 1417       PT SHORT TERM GOAL #1   Title Ind and compliant with initial HEP    Time 4    Period Weeks    Status Achieved   2/7   Target Date 11/05/21      PT SHORT TERM GOAL #2   Title Improved 5x sit to stand to <= 15 sec    Time 4    Period Weeks    Status On-going   25 sec     PT SHORT TERM GOAL #3   Title Improved TUG to <= 16 sec    Time 4    Period Weeks    Status On-going   28.3 sec              PT Long Term Goals - 11/24/21 1418       PT LONG TERM GOAL #1   Title Ind and compliant with HEP to maintain therapy gains    Time 8    Period Weeks    Status On-going    Target Date 12/03/21      PT LONG TERM GOAL #2   Title Patient able to safely ambulate at home in community with least restrictive AD and a normal gait pattern including  good heel strike bil (with or without AFO).    Time 8    Period Weeks    Status On-going    Target Date 12/03/21      PT LONG TERM GOAL #3   Title Patient to demo increased bil LE strength to allow him to don/doff his pants independently and apply pressure to gas/brake pedals to allow return to driving.    Time 8    Period Weeks    Status On-going   2/7- see flowsheet   Target Date 12/03/21      PT LONG TERM GOAL #4   Title Patient to demonstrate improved 5x sit to stand to <= 12 sec and TUG to <= 10 sec (with rollator) to decrease risk of falls.    Time 8    Period Weeks    Status On-going    Target Date 12/03/21      PT LONG TERM GOAL #5   Title Patient able to get in/out of bed independently at home.    Time 8    Period Weeks    Status Achieved   11/04/21   Target Date 12/03/21      PT LONG TERM GOAL #6   Title Patient able to demo increased LE strength by being able to ascend/descend his stairs at home with a reciprocal gait pattern using railing.    Time 8    Period Weeks    Status On-going    Target Date 12/03/21                   Plan - 11/26/21 1550     Clinical Impression Statement Pt still shows functional limitations on TUG and 5xSTS test. Mostly he still is limited by LE weakness, that increases time spent with ADLs assessed on the TUG or 5xSTS. We did progress standing with no UE, min assist given for safety and balance. He would need a recert next visit.  Personal Factors and Comorbidities Comorbidity 3+;Time since onset of injury/illness/exacerbation;Behavior Pattern    Comorbidities T2DM, HTN, spinal fusion 4/22, heart murmur, brain bleed Jan 2020, RA, neuropathy    PT Frequency 2x / week    PT Duration 8 weeks    PT Treatment/Interventions ADLs/Self Care Home Management;Aquatic Therapy;Biofeedback;Cryotherapy;Electrical Stimulation;Moist Heat;Neuromuscular re-education;Balance training;Therapeutic exercise;Therapeutic activities;Functional  mobility training;Stair training;Gait training;Patient/family education;Manual techniques;Dry needling;Passive range of motion;Taping    PT Next Visit Plan LE strengthening (including ankles) using machines for greater resistance and cardio equipment; balance/gait/stairs; see functional goals    PT Home Exercise Plan [email or web address]    Consulted and Agree with Plan of Care Patient             Patient will benefit from skilled therapeutic intervention in order to improve the following deficits and impairments:  Abnormal gait, Decreased range of motion, Pain, Decreased activity tolerance, Decreased balance, Impaired flexibility, Decreased strength  Visit Diagnosis: Muscle weakness (generalized)  Foot drop, left  Unsteadiness on feet  Other abnormalities of gait and mobility  Chronic midline low back pain, unspecified whether sciatica present     Problem List Patient Active Problem List   Diagnosis Date Noted   Foot drop, left foot 10/02/2021   History of colonic polyps 10/02/2021   History of rheumatic fever 10/02/2021   Stress fracture of left foot 09/03/2021   Gabapentin overdose, accidental or unintentional, initial encounter 03/08/2021   TIA (transient ischemic attack) 03/05/2021   Body mass index (BMI) 36.0-36.9, adult 10/03/2020   Subdural hemorrhage following injury without open intracranial wound and with prolonged loss of consciousness (more than 24 hours) without return to pre-existing conscious level ([place]) 10/03/2020   AKI (acute kidney injury) ([place])    Acute encephalopathy    SDH (subdural hematoma) 99/83/3825   Cyclic vomiting syndrome 08/06/2019   Neuropathy 12/25/2018   AK (actinic keratosis) 08/24/2018   Neoplasm of uncertain behavior 05/39/7673   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   Tendinopathy of right gluteus medius 07/13/2018   Tendinopathy of left gluteus medius 07/13/2018   Squamous cell carcinoma in situ (SCCIS) of skin 03/24/2018   Vitamin D deficiency  11/29/2017   Stage 3 chronic kidney disease ([facility] [place]) 10/28/2017   Primary osteoarthritis of right hip 09/11/2017   Essential hypertension 07/18/2017   Hyperlipidemia associated with type 2 diabetes mellitus ([place]) 07/18/2017   Type 2 diabetes mellitus with diabetic neuropathy, unspecified ([place]) 07/18/2017   Heart murmur 07/18/2017   Hypertriglyceridemia 07/18/2017   History of stroke 07/18/2017   DDD (degenerative disc disease), lumbar 06/15/2017   Iron deficiency anemia 06/15/2017   Stroke ([place]) 06/15/2017   Encounter for hepatitis C screening test for low risk patient 06/30/2016   Microalbuminuria due to type 2 diabetes mellitus ([place]) 03/19/2016   Hypertrophy of inferior nasal turbinate 12/20/2015   Disease of nasal cavity and sinuses 12/10/2015   Degenerative tear of acetabular labrum of left hip 11/04/2015   History of tobacco abuse 05/15/2015   Morbid (severe) obesity due to excess calories ([place]) 04/11/2015   Disorder of both eustachian tubes 01/27/2015   Maxillary sinusitis 01/13/2015   Lipoprotein deficiency 01/31/2014   Gastro-esophageal reflux disease without esophagitis 11/02/2013   Rheumatic fever 11/02/2013   Erectile dysfunction associated with type 2 diabetes mellitus ([place]) 07/31/2013   Type 2 diabetes mellitus with hyperglycemia, without long-term current use of insulin ([place]) 11/16/2012    Artist Pais, PTA 11/26/2021, 3:56 PM  [facility]  [place], [place], [address] Phone: [phone]   Fax:  [phone]  Name: Tommy Green MRN: [id number] Date of Birth: 1946-01-13

## 2021-11-27 ENCOUNTER — Ambulatory Visit (INDEPENDENT_AMBULATORY_CARE_PROVIDER_SITE_OTHER): Payer: Medicare HMO | Admitting: Family Medicine

## 2021-11-27 ENCOUNTER — Encounter: Payer: Self-pay | Admitting: Family Medicine

## 2021-11-27 VITALS — BP 141/86 | HR 93 | Temp 98.9°F | Ht 71.0 in | Wt 223.5 lb

## 2021-11-27 DIAGNOSIS — L989 Disorder of the skin and subcutaneous tissue, unspecified: Secondary | ICD-10-CM

## 2021-11-27 MED ORDER — HYDROCHLOROTHIAZIDE 25 MG PO TABS: 25.0000 mg | ORAL_TABLET | Freq: Every day | ORAL | 3 refills | Status: DC

## 2021-11-27 NOTE — Progress Notes (Signed)
Chief Complaint  Patient presents with   Foot Problem    Tommy Green is a 76 y.o. male here for a skin complaint. Here w wife.   Duration: 2 weeks Location: L 2nd toe Pruritic? No Painful? Yes Drainage? No New soaps/lotions/topicals/detergents? No Sick contacts? No Other associated symptoms: thickening of skin, bleeding; no fevers Therapies tried thus far: TAO  Past Medical History:  Diagnosis Date   Anemia    Essential hypertension 07/18/2017   Foot drop, left foot    due to back surgery in 01/2021   History of colonic polyps    History of rheumatic fever    History of stroke 07/18/2017   Hypertriglyceridemia 07/18/2017   Type 2 diabetes mellitus with diabetic neuropathy, unspecified ([place]) 07/18/2017    BP (!) 141/86    Pulse 93    Temp 98.9 F (37.2 C) (Oral)    Ht 5\' 11"  (1.803 m)    Wt 223 lb 8 oz (101.4 kg)    SpO2 97%    BMI 31.17 kg/m  Gen: awake, alert, appearing stated age Lungs: No accessory muscle use Skin: hyperkeratotic patch over distal portion of 2nd L toe. Mild ttp. Blood blister noted. No drainage, erythema, fluctuance, excoriation Psych: Age appropriate judgment and insight  Sore on toe  TAO bid, pare down excess skin. Warning signs and symptoms verbalized and written down in AVS. Supportive shoes rec'd.  F/u prn. The patient and his spouse voiced understanding and agreement to the plan.  [place], DO 11/27/21 12:14 PM

## 2021-11-27 NOTE — Patient Instructions (Addendum)
Google "mesh shoes". Puma and [employer] have affordable options.   Consider putting a bandaid over the area.  When you do wash it, use only soap and water. Do not vigorously scrub. Apply triple antibiotic ointment (like Neosporin) twice daily. Keep the area clean and dry.   Things to look out for: increasing pain not relieved by ibuprofen/acetaminophen, fevers, spreading redness, drainage of pus, or foul odor.  Let us know if you need anything.

## 2021-11-27 NOTE — Patient Instructions (Addendum)
Mr. Mihalik It was a pleasure speaking with you today.  I have attached a summary of our visit today and information about your health goals.   If you have any questions or concerns, please feel free to contact me either at the phone number below or with a MyChart message.   Keep up the good work!  [name], PharmD Clinical Pharmacist [facility] [place] [phone] (direct line)  [phone] (main office number)   Dry Mouth:   Current therapy:  Over-the-counter dry mouth lozenges Interventions:  Reviewed medications for potential effects on dental health and dry mouth Amlodipine is associated with gingival hyperplasia incidence is unknown, but may be as high as 10%; However patient's symptoms do not seem to align with gingival hyperplasia Gabapentin has reported 1.7 to 4.8% incidence of xerostomia. Patient has tapered to only 200mg  at bedtime over the last year. Since dry mouth symptoms did not seem to less over the period that patient was tapering gabapentin I do not have high suspicion that gabapentin is causing dry mouth but might be something for patient to try holding for 1 week to see if he notices any changes. Hydrocodone - patient is only taking 1 at night and has been tapering since his back surgery in December 2022. Usually more an issue with high doses of opioid medications  Medical Conditions associated with potential dry mouth:  Rheumatoid arthritis / Sjogren's Syndrome / autoimmune conditions: recommended patient discuss with his rheumatologist Type 2 diabetes: usually when poorly controlled; patient's home blood glucose and last A1c do not indicate he should be having a lot of dry mouth due to diabetes.  Hold off on starting hydrochlorothiazide since blood pressure has been at goal and could cause dry mouth Try over-the-counter gum, lozenges or rinses for dry mouth like ACT dry mouth, Biotene or Hydral. However use sparingly since the sugar  alcohols (sorbitol and xylitol) in them might cause gas, bloating and loose stools.  Diabetes: Diabetes controlled per last A1c but is due to see endocrinologist. A1c goal < 7.0%  Lab Results  Component Value Date   HGBA1C 6.4 05/04/2021   Endocrinologist is Dr Meredith Pel Current treatment: Tyler Aas 30 units daily at bedtime Novolog insulin inject 0 to 10 units per sliding scale prior to each meal  Interventions:  Make follow up appointment with Dr Meredith Pel.  Reviewed home blood glucose readings and reviewed goals  Fasting blood glucose goal (before meals) = 80 to 130 Blood glucose goal after a meal = less than 180   Hypertension: Controlled; blood pressure goal < 130/80 Current treatment: Amlodipine 10mg  daily  Lisinopril 40mg  daily Hydrochlorothiazide  25mg  daily (patient has not started)  Interventions:  Recommend not starting hydrochlorothiazide since home blood pressure has been at goal Continue to take lisinopril and amlodipine Continue to check blood pressure daily and record  Hyperlipidemia, mixed: Uncontrolled; LDL goal < 70 and Tg goal < 150 Current treatment: Fenofibrate 200mg  daily with breakfast.  Fish oil 1000mg  daily Interventions:  Checked patient 2023 [employer] benefits. Ezetimibe is tier 1 - $5 at pharmacy and $0 from mail order Patient agrees to restart ezetimibe 10mg  daily  Continue fenofibrate and fish oil. Recheck lipids in 4 to 6 weeks.   Patient Goals/Self-Care Activities Over the next 90 days, patient will:  take medications as prescribed,  check glucose 3 to 4 times a day prior to meals and at bedtime or as needed, document, and provide at future appointments,  check blood pressure  2 to 3 times per week, document, and provide at future appointments, and  Restart ezetimibe 10mg  daily.  Hold off on starting hydrochlorothiazide since blood pressure has been at goal and could cause dry mouth Recommended try over-the-counter gum, lozenges or rinses for dry  mouth like ACT dry mouth, Biotene or Hydral. However use sparingly since the sugar alcohols (sorbitol and xylitol) in them might cause gas, bloating and loose stools.   Follow Up Plan: Telephone follow up appointment with care management team member scheduled for:  4 weeks.   The patient verbalized understanding of instructions, educational materials, and care plan provided today and agreed to receive a mailed copy of patient instructions, educational materials, and care plan.

## 2021-11-27 NOTE — Chronic Care Management (AMB) (Signed)
Chronic Care Management Pharmacy Note  11/27/2021 Name:  Tommy Green MRN:  [id number] DOB:  November 05, 1945  Summary: Reviewed medications and conditions that could cause dry mouth. Recommended patient discuss with rheumatologist.  Also home blood pressure has been at goal. Will hold off on hydrochlorothiazide since can worsen dry mouth.  Recommended Biotene or dry mouth gum but monitor for gas, bloating or diarrhea since sugar alcohols in these products can cause GI issues.  Restarted ezetimibe.   Plan: follow up 4 weeks to check blood pressure and dry mouth  Subjective: Tommy Green is an 76 y.o. year old male who is a primary patient of [name], DO.  The CCM team was consulted for assistance with disease management and care coordination needs.    Engaged with patient by telephone for initial visit in response to provider referral for pharmacy case management and/or care coordination services.   Consent to Services:  The patient was given the following information about Chronic Care Management services today, agreed to services, and gave verbal consent: 1. CCM service includes personalized support from designated clinical staff supervised by the primary care provider, including individualized plan of care and coordination with other care providers 2. 24/7 contact phone numbers for assistance for urgent and routine care needs. 3. Service will only be billed when office clinical staff spend 20 minutes or more in a month to coordinate care. 4. Only one practitioner may furnish and bill the service in a calendar month. 5.The patient may stop CCM services at any time (effective at the end of the month) by phone call to the office staff. 6. The patient will be responsible for cost sharing (co-pay) of up to 20% of the service fee (after annual deductible is met). Patient agreed to services and consent obtained.  Patient Care Team: [name], DO as PCP - General  (Family Medicine) Alanson Aly, MD as Referring Physician (Internal Medicine) Jimmie Molly, OD (Optometry) Starling Manns, MD (Orthopedic Surgery) [name], RPH-CPP (Pharmacist)  Recent office visits: 11/09/21-Nicholas Nolene Ebbs, DO (PCP, Video Visit) Seen for hypertension. AMB Referral to [facility]. Stop Coreg, Start HCTZ. 08/31/21-Nicholas Nolene Ebbs, DO (PCP) Seen for Edema. Ambulatory referral to Sports Medicine. Ambulatory referral to Cardiology. Follow up visit prn.   Recent consult visits:  11/17/21-(Rheumatology) Bo Merino, MD. Medication management visit. Labs ordered. Follow up in 5 months.  10/29/21-(Podiatry) Gae Dry, DPM. Seen for diabetic foot exam. 10/20/21-(Cardiology) [name], MD. Echo cardiogram ordered. Follow up in 6 months. 09/17/21-(Sports Medicine) Rosemarie Ax, MD. Follow up for his left foot pain. 09/08/21-(Dermatology) Loleta Chance. Notes not available. 09/04/21-(Gastroenterology) [facility], [place]) Seen for abdominal pain. Symptoms began after back surgery in 01/2021. He received many antibiotics at that time and ever since he has had excessive gas and abdominal bloating. Suspected SIBO from DM and prior abdominal surgery. Pt declined Celiac labs, GIPP stool study, and a HBT but he was empirically started on Ciprofloxacin 556m BID x 10 days. Patient's symptoms improved, no indication for further evaluation. Continue probiotic and as needed simethicone over-the-counter. Discussed a low FODMAP diet as well for bloating. Follow up in 1 year or earlier. If symptoms return may need HBT or celiac labs, pt will update [place] 09/03/21-(Sport Medicine) JRosemarie Ax MD. Seen for left foot pain. Follow up in 2-3 weeks. 07/31/21-(Gastroenterology) NAchilles Dunk PA-C. Consult visit. Start on Cipro 5030mBID x 10 days 06/16/21-(Rheumatology) TaLovena Le  Haskel Schroeder, PA-C. Seen for medication monitoring. Labs  ordered. Follow up in 5 months.   Hospital visits: None in previous 6 months  Objective:  Lab Results  Component Value Date   CREATININE 1.72 (H) 11/17/2021   CREATININE 1.50 (H) 06/16/2021   CREATININE 1.12 05/04/2021    Lab Results  Component Value Date   HGBA1C 6.4 05/04/2021   Last diabetic Eye exam: No results found for: HMDIABEYEEXA  Last diabetic Foot exam: No results found for: HMDIABFOOTEX      Component Value Date/Time   CHOL 187 05/04/2021 0852   TRIG 315.0 (H) 05/04/2021 0852   HDL 28.10 (L) 05/04/2021 0852   CHOLHDL 7 05/04/2021 0852   VLDL 63.0 (H) 05/04/2021 0852   LDLCALC 79 03/06/2021 0413   LDLDIRECT 107.0 05/04/2021 0852    Hepatic Function Latest Ref Rng & Units 11/17/2021 06/16/2021 05/04/2021  Total Protein 6.1 - 8.1 g/dL 6.5 6.5 6.4  Albumin 3.5 - 5.2 g/dL - - 4.1  AST 10 - 35 U/L _0 ALT 9 - 46 U/L _1 Alk Phosphatase 39 - 117 U/L - - 31(L)  Total Bilirubin 0.2 - 1.2 mg/dL 0.3 0.3 0.5  Bilirubin, Direct 0.0 - 0.2 mg/dL - - -    No results found for: TSH, FREET4  CBC Latest Ref Rng & Units 11/17/2021 06/16/2021 03/05/2021  WBC 3.8 - 10.8 Thousand/uL 8.7 8.3 8.0  Hemoglobin 13.2 - 17.1 g/dL 10.6(L) 10.5(L) 8.4(L)  Hematocrit 38.5 - 50.0 % 33.2(L) 33.2(L) 27.2(L)  Platelets 140 - 400 Thousand/uL 298 312 311    Lab Results  Component Value Date/Time   VD25OH 26.88 (L) 10/24/2017 10:41 AM    Clinical ASCVD: Yes  The ASCVD Risk score (Arnett DK, et al., 2019) failed to calculate for the following reasons:   The patient has a prior MI or stroke diagnosis    Other: (CHADS2VASc if Afib, PHQ9 if depression, MMRC or CAT for COPD, ACT, DEXA)  Social History   Tobacco Use  Smoking Status Former   Years: 10.00   Types: Cigarettes   Quit date: 1973   Years since quitting: 50.1  Smokeless Tobacco Never   BP Readings from Last 3 Encounters:  11/27/21 (!) 141/86  11/17/21 (!) 147/79  10/20/21 134/69   Pulse Readings from Last 3  Encounters:  11/27/21 93  11/17/21 80  10/20/21 72   Wt Readings from Last 3 Encounters:  11/27/21 223 lb 8 oz (101.4 kg)  11/17/21 224 lb (101.6 kg)  10/20/21 231 lb 6.4 oz (105 kg)    Assessment: Review of patient past medical history, allergies, medications, health status, including review of consultants reports, laboratory and other test data, was performed as part of comprehensive evaluation and provision of chronic care management services.   SDOH:  (Social Determinants of Health) assessments and interventions performed:  SDOH Interventions    Flowsheet Row Most Recent Value  SDOH Interventions   Financial Strain Interventions Intervention Not Indicated  Physical Activity Interventions Other (Comments)  [currently in rehab post back surgery]       CCM Care Plan  Allergies  Allergen Reactions   Fluzone Quadrivalent [Influenza Vac Split Quad] Other (See Comments)    "Got the flu"   Influenza Vaccines Other (See Comments)    "Got the flu"   Aspirin Other (See Comments)    Gastroesophageal reflux and "allergic," per MAR    Atorvastatin Other (See Comments)    Myalgias, GI upset, and "  allergic," per MAR   Canagliflozin Other (See Comments)    Pt reports that the use of Invokana caused acute KIDNEY FAILURE ([facility] records) and "allergic," per [facility]   Statins Other (See Comments)    Muscle pain- "allergic," per [facility]    Medications Reviewed Today     Reviewed by [name], DO (Physician) on 11/27/21 at 1134  Med List Status: <None>   Medication Order Taking? Sig Documenting Provider Last Dose Status Informant  acetaminophen (TYLENOL) 325 MG tablet [id number] No Take 650 mg by mouth every 6 (six) hours as needed (for pain). [provider] Taking Active   amLODipine (NORVASC) 5 MG tablet [id number] No Take 1 tablet (5 mg total) by mouth daily. [name], DO Taking Active   B Complex Vitamins (B COMPLEX PO) [id number] No Take 1 tablet by  mouth daily. [provider] Taking Active Nursing Home Medication Administration Guide (MAG)  Cholecalciferol (VITAMIN D3 SUPER STRENGTH) 50 MCG (2000 UT) CAPS [id number] No Take 2,000 Units by mouth daily. [provider] Taking Active Nursing Home Medication Administration Guide (MAG)  Etanercept (ENBREL MINI) 50 MG/ML SOCT [id number] No Inject 50 mg into the skin once a week. [name], PA-C Taking Active   ezetimibe (ZETIA) 10 MG tablet [id number]  Take 1 tablet (10 mg total) by mouth daily. For elevated cholesterol and triglycerides [name], DO  Active   fenofibrate micronized (LOFIBRA) 200 MG capsule [id number] No TAKE 1 CAPSULE DAILY BEFOREBREAKFAST [name], DO Taking Active   gabapentin (NEURONTIN) 100 MG capsule [id number] No TAKE 2 CAPSULES AT BEDTIME [name], DO Taking Active   HYDROcodone-acetaminophen (NORCO/VICODIN) 5-325 MG tablet [id number] No Take 1 tablet by mouth at bedtime as needed for moderate pain. [provider] Taking Active   insulin aspart (NOVOLOG FLEXPEN) 100 UNIT/ML FlexPen [id number] No Inject 0-10 Units into the skin 3 (three) times daily with meals. Inject 0-10 units into the skin before meals, PER SLIDING SCALE: BGL 0-200 = give nothing, 201-250 = 2 units, 251-300 = 4 units, 301-350 = 6 units, 351-400 = 8 units, >400 = 10 units, push fluids, and repeat BGL check in 2 hours. If BGL remains >400 after 2 hours, CALL MD. [provider] Taking Active   insulin degludec (TRESIBA) 100 UNIT/ML FlexTouch Pen [id number] No Inject 30 Units into the skin at bedtime. Donne Hazel, MD Taking Active   leflunomide (ARAVA) 10 MG tablet [id number] No TAKE 1 TABLET BY MOUTH EVERY DAY Deveshwar, Shaili, MD Taking Active   lisinopril (ZESTRIL) 40 MG tablet [id number] No Take 40 mg by mouth daily. [provider] Taking Active   Magnesium 200 MG TABS [id number] No Take 200 mg by mouth daily in the  afternoon. [provider] Taking Active Nursing Home Medication Administration Guide (MAG)  melatonin 5 MG TABS [id number] No Take 5 mg by mouth at bedtime as needed. [provider] Taking Active   Multiple Vitamins-Minerals (MULTIVITAMIN WITH MINERALS) tablet [id number] No Take 1 tablet by mouth daily. [provider] Taking Active Nursing Home Medication Administration Guide (MAG)  Omega 3 1000 MG CAPS [id number] No Take 1,000 mg by mouth daily. [provider] Taking Active   omeprazole (PRILOSEC) 20 MG capsule [id number] No Take 1 capsule (20 mg total) by mouth daily before breakfast. [name], DO Taking Active   [facility] VERIO test strip [id number] No 4 (four) times daily. [provider] Taking Active Nursing Home Medication Administration  Guide (MAG)  Potassium 99 MG TABS [id number] No Take 99 mg by mouth daily. [provider] Taking Active Nursing Home Medication Administration Guide (MAG)  vitamin B-12 (CYANOCOBALAMIN) 1000 MCG tablet [id number] No Take 1,000 mcg by mouth daily. [provider] Taking Active Nursing Home Medication Administration Guide (MAG)  Med List Note Dessie Coma, CPhT 03/05/21 1228): [facility]- [address] [place], [place] [address]  [phone]            Patient Active Problem List   Diagnosis Date Noted   Foot drop, left foot 10/02/2021   History of colonic polyps 10/02/2021   History of rheumatic fever 10/02/2021   Stress fracture of left foot 09/03/2021   Gabapentin overdose, accidental or unintentional, initial encounter 03/08/2021   TIA (transient ischemic attack) 03/05/2021   Body mass index (BMI) 36.0-36.9, adult 10/03/2020   Subdural hemorrhage following injury without open intracranial wound and with prolonged loss of consciousness (more than 24 hours) without return to pre-existing conscious level ([place]) 10/03/2020   AKI (acute kidney injury) ([place])    Acute  encephalopathy    SDH (subdural hematoma) 17/91/5056   Cyclic vomiting syndrome 08/06/2019   Neuropathy 12/25/2018   AK (actinic keratosis) 08/24/2018   Neoplasm of uncertain behavior 97/94/8016   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   Tendinopathy of right gluteus medius 07/13/2018   Tendinopathy of left gluteus medius 07/13/2018   Squamous cell carcinoma in situ (SCCIS) of skin 03/24/2018   Vitamin D deficiency 11/29/2017   Stage 3 chronic kidney disease ([place]) 10/28/2017   Primary osteoarthritis of right hip 09/11/2017   Essential hypertension 07/18/2017   Hyperlipidemia associated with type 2 diabetes mellitus ([place]) 07/18/2017   Type 2 diabetes mellitus with diabetic neuropathy, unspecified ([place]) 07/18/2017   Heart murmur 07/18/2017   Hypertriglyceridemia 07/18/2017   History of stroke 07/18/2017   DDD (degenerative disc disease), lumbar 06/15/2017   Iron deficiency anemia 06/15/2017   Stroke ([place]) 06/15/2017   Encounter for hepatitis C screening test for low risk patient 06/30/2016   Microalbuminuria due to type 2 diabetes mellitus ([place]) 03/19/2016   Hypertrophy of inferior nasal turbinate 12/20/2015   Disease of nasal cavity and sinuses 12/10/2015   Degenerative tear of acetabular labrum of left hip 11/04/2015   History of tobacco abuse 05/15/2015   Morbid (severe) obesity due to excess calories ([place]) 04/11/2015   Disorder of both eustachian tubes 01/27/2015   Maxillary sinusitis 01/13/2015   Lipoprotein deficiency 01/31/2014   Gastro-esophageal reflux disease without esophagitis 11/02/2013   Rheumatic fever 11/02/2013   Erectile dysfunction associated with type 2 diabetes mellitus ([place]) 07/31/2013   Type 2 diabetes mellitus with hyperglycemia, without long-term current use of insulin ([place]) 11/16/2012    Immunization History  Administered Date(s) Administered   Fluad Quad(high Dose 65+) 08/31/2021   Influenza-Unspecified 07/31/2013, 04/17/2014   Moderna Sars-Covid-2  Vaccination 12/11/2019, 01/10/2020, 08/08/2020   Pneumococcal Conjugate-13 08/07/2015   Pneumococcal Polysaccharide-23 04/17/2013, 07/18/2013   Td 12/25/2018   Zoster, Live 04/17/2013    Conditions to be addressed/monitored: CAD, HTN, HLD, DMII, and dry mouth / dental issues; neuropathy; TAVR; aortic stenosis, severe; CKD - 3  Care Plan : General Pharmacy (Adult)  Updates made by [name], RPH-CPP since 11/27/2021 12:00 AM     Problem: Chronic conditions: HTN; Rheumatoid arthritis; dry mouth; chronic back pain; HDL with hypertriglyceridemia; Type 2 DM; CKD;Nonrheumatic aortic valve stenosis; anemia      Long-Range Goal: Provide education, support and care coordination for medication therapy  and chronic conditions   Start Date: 11/25/2021  Priority: High  Note:   Current Barriers:  Unable to achieve control of hyperlipidemia, mixed with elevated LDL and triglycerides  Does not adhere to prescribed medication regimen Experiencing dry mouth with effects on dental health - concerned it could be medication related  Pharmacist Clinical Goal(s):  Over the next 90 days, patient will achieve adherence to monitoring guidelines and medication adherence to achieve therapeutic efficacy achieve control of mixed hyperlipidemia  as evidenced by LDL < 70 maintain control of hypertension and type 2 DM as evidenced by blood pressure < 130/80 and A1c < 7.0  Identify medication causes of dry mouth and is possible identify alternative therapies  through collaboration with PharmD and provider.   Interventions: 1:1 collaboration with [name], DO regarding development and update of comprehensive plan of care as evidenced by provider attestation and co-signature Inter-disciplinary care team collaboration (see longitudinal plan of care) Comprehensive medication review performed; medication list updated in electronic medical record  Dry Mouth:  Patient reports dry mouth that started  several months ago.  He has had several crowns that have come off and his dentist is concerned that it is related to his medications Patient's wife felt that carvedilol was cause of dry mouth because it has been started in the last year.  Carvedilol was stopped at last office visit with PCP. Patient feels that dry mouth has improved.  Patient was to start hydrochlorothiazide in place of carvedilol but has not due to community pharmacist warning that diuretics could also cause dry mouth.  Current therapy:  Over-the-counter dry mouth lozenges Interventions:  Reviewed medications for potential effects on dental health and dry mouth Amlodipine is associated with gingival hyperplasia incidence is unknown, but may be as high as 10%; However patient's symptoms do not seem to align with gingival hyperplasia Gabapentin has reported 1.7 to 4.8% incidence of xerostomia. Patient has tapered to only 244m at bedtime over the last year. Since dry mouth symptoms did not seem to less over the period that patient was tapering gabapentin I do not have high suspicion that gabapentin is causing dry mouth but might be something for patient to try holding for 1 week to see if she notices any changes. Hydrocodone - patient is only taking 1 at night and has been tapering since his back surgery in December 2022. Usually more an issue with high doses of opioid medications  Medical Conditions associated with potential dry mouth:  Rheumatoid arthritis / Sjogren's Syndrome / autoimmune conditions: recommended patient discuss with his rheumatologist Type 2 DM: usually when poorly controlled; patient's home blood glucose and last A1c do not indicate he should be having a lot of dry mouth due to diabetes.  Recommend he hold off on starting hydrochlorothiazide since blood pressure has been at goal and could cause dry mouth Recommended he can try over-the-counter gum, lozenges or rinses for dry mouth like ACT dry mouth, Biotene or  Hydral. However he should use sparingly since the sugar alcohols (sorbitol and xylitol) in them might cause gas, bloating and loose stools and he has recently been seen by GI for these symptoms.   Diabetes / CKD - 3a: Diabetes controlled per last A1c but is due to see endocrinologist. A1c goal < 7.0% Renal function has increased over the last 6 months Endocrinologist is Dr DMeredith PelCurrent treatment: TTyler Aas30 units daily at bedtime Novolog insulin inject 0 to 10 units per sliding scale prior to each meal (patient  reports he usually only needs once per day) Current glucose readings: usually 90 to 120 in the morning; has occasional readings 150 to 180 prior to meals. Current exercise: only physical therapy twice per week Interventions:  Recommended he make follow up appointment with Dr Meredith Pel.  Will send Dr Creig Hines office last CMP. Might consider adding SGLT2 due to rise in Scr. However there is a notation that Invokana cause renal failure in past so if started would start with low dose and monitor closely  Reviewed home blood glucose readings and reviewed goals  Fasting blood glucose goal (before meals) = 80 to 130 Blood glucose goal after a meal = less than 180   Hypertension: Controlled; blood pressure goal < 130/80 Current treatment: Amlodipine 61m daily  Lisinopril 460mdaily (our med list had 2047mut verified with patient and pharmacy records he has been taking 7m87mily) Hydrochlorothiazide  25mg1mly (patient has not started)  Previous therapy tried: carvedilol - stopped due to suspected dry mouth Current home readings:  SBP 120-130 DBP 75/80 Denies hypotensive/hypertensive symptoms Interventions:  Updated med list with correct lisinopril dose Recommended that he not start hydrochlorothiazide since home blood pressure has been at goal Continue to take lisinopril and amlodipine Continue to check blood pressure daily and record  Hyperlipidemia, mixed: Uncontrolled; LDL goal  < 70 and Tg goal < 150 current treatment: Fenofibrate 200mg 27my with breakfast.  Fish oil 1000mg d81m Medications previously tried: atorvastatin - myalgias; Has also taken pravastatin and lovastatin in past; notation that patient is intolerant to all statins Looks like patient was taking ezetimibe - started 04/2021 but only filled once. There is a notation that cost was high. Interventions:  Checked patient 2023 [employer] benefits. Ezetimibe if tier 1 - $5 at pharmacy and $0 from mail order Patient agrees to restart ezetimibe 10mg da39m Continue fenofibrate and fish oil. Recheck lipids in 4 to 6 weeks.   Patient Goals/Self-Care Activities Over the next 90 days, patient will:  take medications as prescribed,  check glucose 3 to 4 times a day prior to meals and at bedtime or as needed, document, and provide at future appointments,  check blood pressure 2 to 3 times per week, document, and provide at future appointments, and  Restart ezetimibe 10mg dai11m Hold off on starting hydrochlorothiazide since blood pressure has been at goal and could cause dry mouth Recommended try over-the-counter gum, lozenges or rinses for dry mouth like ACT dry mouth, Biotene or Hydral. However use sparingly since the sugar alcohols (sorbitol and xylitol) in them might cause gas, bloating and loose stools.   Follow Up Plan: Telephone follow up appointment with care management team member scheduled for:  2 weeks.          Medication Assistance: None required.  Patient affirms current coverage meets needs.  Patient's preferred pharmacy is:  [facility] [place] [address] [facility] [address] [place] [phone]:  Patient agrees to Care Plan and Follow-up.  Plan: Telephone follow up appointment with care management team member scheduled for:  4 weeks check blood pressure and dry mouth  Zaid Tomes EckaCherre Robinslinical Pharmacist [place] [facility]

## 2021-12-01 ENCOUNTER — Other Ambulatory Visit: Payer: Self-pay

## 2021-12-01 ENCOUNTER — Ambulatory Visit: Payer: Medicare HMO

## 2021-12-01 DIAGNOSIS — M6281 Muscle weakness (generalized): Secondary | ICD-10-CM | POA: Diagnosis not present

## 2021-12-01 DIAGNOSIS — M545 Low back pain, unspecified: Secondary | ICD-10-CM | POA: Diagnosis not present

## 2021-12-01 DIAGNOSIS — R2689 Other abnormalities of gait and mobility: Secondary | ICD-10-CM | POA: Diagnosis not present

## 2021-12-01 DIAGNOSIS — G8929 Other chronic pain: Secondary | ICD-10-CM | POA: Diagnosis not present

## 2021-12-01 DIAGNOSIS — R2681 Unsteadiness on feet: Secondary | ICD-10-CM | POA: Diagnosis not present

## 2021-12-01 DIAGNOSIS — M21372 Foot drop, left foot: Secondary | ICD-10-CM | POA: Diagnosis not present

## 2021-12-01 NOTE — Therapy (Signed)
[facility] [address]  [place], [place], [address] Phone: [phone]   Fax:  [phone]  Physical Therapy Treatment  Patient Details  Name: Tommy Green MRN: [id number] Date of Birth: 1945/11/24 Referring Provider (PT): Riki Sheer, DO   Encounter Date: 12/01/2021   PT End of Session - 12/01/21 1443     Visit Number 10    Number of Visits 16    Date for PT Re-Evaluation 12/03/21    Authorization Type [employer] MCR    Progress Note Due on Visit 10    PT Start Time 1401    PT Stop Time 4401    PT Time Calculation (min) 41 min    Activity Tolerance Patient tolerated treatment well;Patient limited by fatigue    Behavior During Therapy [facility] for tasks assessed/performed             Past Medical History:  Diagnosis Date   Anemia    Essential hypertension 07/18/2017   Foot drop, left foot    due to back surgery in 01/2021   History of colonic polyps    History of rheumatic fever    History of stroke 07/18/2017   Hypertriglyceridemia 07/18/2017   Type 2 diabetes mellitus with diabetic neuropathy, unspecified ([place]) 07/18/2017    Past Surgical History:  Procedure Laterality Date   COLONOSCOPY     around 2018 [facility]   COLONOSCOPY  09/02/2020   CRANIOTOMY Left 10/25/2019   Procedure: CRANIOTOMY HEMATOMA EVACUATION SUBDURAL;  Surgeon: Vallarie Mare, MD;  Location: [place];  Service: Neurosurgery;  Laterality: Left;   ESOPHAGOGASTRODUODENOSCOPY     around 2018 with [facility]   FOOT CAPSULOTOMY Left 07/25/2008   Mid Foot #2 MPJ   Hammertoe Repair Left 07/25/2008   #2 toe   SPINAL FUSION  01/2021   TARSAL TUNNEL RELEASE Left 07/25/2008   UPPER GASTROINTESTINAL ENDOSCOPY  09/02/2020    There were no vitals filed for this visit.   Subjective Assessment - 12/01/21 1405     Subjective Doing good, toes still sore, along with the back.    Pertinent History T2DM, HTN, spinal fusion  4/22, heart murmur, brain bleed Jan 2020, RA    Currently in Pain? Yes    Pain Score 3     Pain Location Back    Pain Orientation Right;Left    Pain Descriptors / Indicators Sore    Pain Type Chronic pain                               [facility] Adult PT Treatment/Exercise - 12/01/21 0001       High Level Balance   High Level Balance Activities Side stepping    High Level Balance Comments 6x along the counter, 1 rest break sitting in between      Exercises   Exercises Lumbar;Knee/Hip      Lumbar Exercises: Seated   Other Seated Lumbar Exercises pallof press with GTB R/L 15 reps      Knee/Hip Exercises: Aerobic   Nustep L6x72min      Knee/Hip Exercises: Standing   Knee Flexion Strengthening;Both;10 reps    Knee Flexion Limitations 2 hand support    Hip Abduction Stengthening;Both;10 reps;Knee straight    Abduction Limitations 2 hand UE support    Other Standing Knee Exercises standing with EC, intermittent UE support 2x30 sec      Knee/Hip  Exercises: Seated   Sit to Sand 2 sets;5 reps;with UE support   pt very fatigued afterwards                      PT Short Term Goals - 11/26/21 1417       PT SHORT TERM GOAL #1   Title Ind and compliant with initial HEP    Time 4    Period Weeks    Status Achieved   2/7   Target Date 11/05/21      PT SHORT TERM GOAL #2   Title Improved 5x sit to stand to <= 15 sec    Time 4    Period Weeks    Status On-going   25 sec     PT SHORT TERM GOAL #3   Title Improved TUG to <= 16 sec    Time 4    Period Weeks    Status On-going   28.3 sec              PT Long Term Goals - 11/24/21 1418       PT LONG TERM GOAL #1   Title Ind and compliant with HEP to maintain therapy gains    Time 8    Period Weeks    Status On-going    Target Date 12/03/21      PT LONG TERM GOAL #2   Title Patient able to safely ambulate at home in community with least restrictive AD and a normal gait pattern including  good heel strike bil (with or without AFO).    Time 8    Period Weeks    Status On-going    Target Date 12/03/21      PT LONG TERM GOAL #3   Title Patient to demo increased bil LE strength to allow him to don/doff his pants independently and apply pressure to gas/brake pedals to allow return to driving.    Time 8    Period Weeks    Status On-going   2/7- see flowsheet   Target Date 12/03/21      PT LONG TERM GOAL #4   Title Patient to demonstrate improved 5x sit to stand to <= 12 sec and TUG to <= 10 sec (with rollator) to decrease risk of falls.    Time 8    Period Weeks    Status On-going    Target Date 12/03/21      PT LONG TERM GOAL #5   Title Patient able to get in/out of bed independently at home.    Time 8    Period Weeks    Status Achieved   11/04/21   Target Date 12/03/21      PT LONG TERM GOAL #6   Title Patient able to demo increased LE strength by being able to ascend/descend his stairs at home with a reciprocal gait pattern using railing.    Time 8    Period Weeks    Status On-going    Target Date 12/03/21                   Plan - 12/01/21 1627     Clinical Impression Statement Pt still demonstrates fatigue, generalized mostly when WB. We continued working on LE strengthening, he needed cues for posture and to prevent substitution movements with exercises. He initially had difficulty with unsupported standing with EC, needed close supervision to avoid falls, the second trial he was more stable. Sit to stands still  tire him out quickly, as we had to drop to sets of 5 today.    Personal Factors and Comorbidities Comorbidity 3+;Time since onset of injury/illness/exacerbation;Behavior Pattern    Comorbidities T2DM, HTN, spinal fusion 4/22, heart murmur, brain bleed Jan 2020, RA, neuropathy    PT Frequency 2x / week    PT Duration 8 weeks    PT Treatment/Interventions ADLs/Self Care Home Management;Aquatic Therapy;Biofeedback;Cryotherapy;Electrical  Stimulation;Moist Heat;Neuromuscular re-education;Balance training;Therapeutic exercise;Therapeutic activities;Functional mobility training;Stair training;Gait training;Patient/family education;Manual techniques;Dry needling;Passive range of motion;Taping    PT Next Visit Plan Recert; LE strengthening (including ankles) using machines for greater resistance and cardio equipment; balance/gait/stairs; see functional goals    PT Home Exercise Plan [email or web address]    Consulted and Agree with Plan of Care Patient             Patient will benefit from skilled therapeutic intervention in order to improve the following deficits and impairments:  Abnormal gait, Decreased range of motion, Pain, Decreased activity tolerance, Decreased balance, Impaired flexibility, Decreased strength  Visit Diagnosis: Muscle weakness (generalized)  Foot drop, left  Unsteadiness on feet  Other abnormalities of gait and mobility  Chronic midline low back pain, unspecified whether sciatica present     Problem List Patient Active Problem List   Diagnosis Date Noted   Foot drop, left foot 10/02/2021   History of colonic polyps 10/02/2021   History of rheumatic fever 10/02/2021   Stress fracture of left foot 09/03/2021   Gabapentin overdose, accidental or unintentional, initial encounter 03/08/2021   TIA (transient ischemic attack) 03/05/2021   Body mass index (BMI) 36.0-36.9, adult 10/03/2020   Subdural hemorrhage following injury without open intracranial wound and with prolonged loss of consciousness (more than 24 hours) without return to pre-existing conscious level ([place]) 10/03/2020   AKI (acute kidney injury) ([place])    Acute encephalopathy    SDH (subdural hematoma) 31/51/7616   Cyclic vomiting syndrome 08/06/2019   Neuropathy 12/25/2018   AK (actinic keratosis) 08/24/2018   Neoplasm of uncertain behavior 07/37/1062   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   Tendinopathy of right gluteus medius 07/13/2018    Tendinopathy of left gluteus medius 07/13/2018   Squamous cell carcinoma in situ (SCCIS) of skin 03/24/2018   Vitamin D deficiency 11/29/2017   Stage 3 chronic kidney disease ([place]) 10/28/2017   Primary osteoarthritis of right hip 09/11/2017   Essential hypertension 07/18/2017   Hyperlipidemia associated with type 2 diabetes mellitus ([place]) 07/18/2017   Type 2 diabetes mellitus with diabetic neuropathy, unspecified ([place]) 07/18/2017   Heart murmur 07/18/2017   Hypertriglyceridemia 07/18/2017   History of stroke 07/18/2017   DDD (degenerative disc disease), lumbar 06/15/2017   Iron deficiency anemia 06/15/2017   Stroke ([place]) 06/15/2017   Encounter for hepatitis C screening test for low risk patient 06/30/2016   Microalbuminuria due to type 2 diabetes mellitus ([place]) 03/19/2016   Hypertrophy of inferior nasal turbinate 12/20/2015   Disease of nasal cavity and sinuses 12/10/2015   Degenerative tear of acetabular labrum of left hip 11/04/2015   History of tobacco abuse 05/15/2015   Morbid (severe) obesity due to excess calories ([place]) 04/11/2015   Disorder of both eustachian tubes 01/27/2015   Maxillary sinusitis 01/13/2015   Lipoprotein deficiency 01/31/2014   Gastro-esophageal reflux disease without esophagitis 11/02/2013   Rheumatic fever 11/02/2013   Erectile dysfunction associated with type 2 diabetes mellitus ([place]) 07/31/2013   Type 2 diabetes mellitus with hyperglycemia, without long-term current use of insulin ([place]) 11/16/2012    Makalah Asberry Renaldo Harrison, PTA  12/01/2021, 4:28 PM  [facility] [address]  [place], [place], [address] Phone: [phone]   Fax:  [phone]  Name: Travian Kerner Goldring MRN: [id number] Date of Birth: Mar 13, 1946

## 2021-12-03 ENCOUNTER — Ambulatory Visit: Payer: Medicare HMO | Admitting: Physical Therapy

## 2021-12-03 ENCOUNTER — Encounter: Payer: Self-pay | Admitting: Physical Therapy

## 2021-12-03 ENCOUNTER — Other Ambulatory Visit: Payer: Self-pay

## 2021-12-03 DIAGNOSIS — M545 Low back pain, unspecified: Secondary | ICD-10-CM | POA: Diagnosis not present

## 2021-12-03 DIAGNOSIS — M6281 Muscle weakness (generalized): Secondary | ICD-10-CM

## 2021-12-03 DIAGNOSIS — M25511 Pain in right shoulder: Secondary | ICD-10-CM | POA: Diagnosis not present

## 2021-12-03 DIAGNOSIS — M21372 Foot drop, left foot: Secondary | ICD-10-CM

## 2021-12-03 DIAGNOSIS — R2689 Other abnormalities of gait and mobility: Secondary | ICD-10-CM | POA: Diagnosis not present

## 2021-12-03 DIAGNOSIS — G8929 Other chronic pain: Secondary | ICD-10-CM | POA: Diagnosis not present

## 2021-12-03 DIAGNOSIS — R2681 Unsteadiness on feet: Secondary | ICD-10-CM | POA: Diagnosis not present

## 2021-12-03 NOTE — Therapy (Addendum)
PHYSICAL THERAPY DISCHARGE SUMMARY  Visits from Start of Care: 11  Current functional level related to goals / functional outcomes: See note below   Remaining deficits: See note below   Education / Equipment: HEP  Plan: Patient goals were not met. Patient is being discharged due to hospitalization and heart surgery.  He will require new order to return to therapy when ready.       Jena Gauss, PT, DPT 01/04/22, 9:45AM   [facility] [address]  [address] [place], [place], [address] Phone: [phone]   Fax:  [phone]  Physical Therapy Treatment Progress Note Reporting Period 10/08/2021 to 12/03/2021  See note below for Objective Data and Assessment of Progress/Goals.      Patient Details  Name: Tommy Green MRN: [id number] Date of Birth: Apr 09, 1946 Referring Provider (PT): Arva Chafe, [place]   Encounter Date: 12/03/2021   PT End of Session - 12/03/21 1408     Visit Number 11    Number of Visits 27    Date for PT Re-Evaluation 01/28/22    Authorization Type [employer] MCR    Progress Note Due on Visit 20    PT Start Time 1401    PT Stop Time 1445    PT Time Calculation (min) 44 min    Activity Tolerance Patient tolerated treatment well;Patient limited by fatigue    Behavior During Therapy [facility] for tasks assessed/performed             Past Medical History:  Diagnosis Date   Anemia    Essential hypertension 07/18/2017   Foot drop, left foot    due to back surgery in 01/2021   History of colonic polyps    History of rheumatic fever    History of stroke 07/18/2017   Hypertriglyceridemia 07/18/2017   Type 2 diabetes mellitus with diabetic neuropathy, unspecified (HCC) 07/18/2017    Past Surgical History:  Procedure Laterality Date   COLONOSCOPY     around 2018 [facility]   COLONOSCOPY  09/02/2020   CRANIOTOMY Left 10/25/2019   Procedure: CRANIOTOMY HEMATOMA EVACUATION SUBDURAL;   Surgeon: Bedelia Person, MD;  Location: [facility];  Service: Neurosurgery;  Laterality: Left;   ESOPHAGOGASTRODUODENOSCOPY     around 2018 with [facility]   FOOT CAPSULOTOMY Left 07/25/2008   Mid Foot #2 MPJ   Hammertoe Repair Left 07/25/2008   #2 toe   SPINAL FUSION  01/2021   TARSAL TUNNEL RELEASE Left 07/25/2008   UPPER GASTROINTESTINAL ENDOSCOPY  09/02/2020    There were no vitals filed for this visit.   Subjective Assessment - 12/03/21 1503     Subjective "I have good days and bad days, and this is a bad day."  Pt. reports a lot of soreness in legs and back today, and would like to work on upper body strength if possible.    Pertinent History T2DM, HTN, spinal fusion 4/22, heart murmur, brain bleed Jan 2020, RA    Patient Stated Goals drive and walk again    Currently in Pain? Yes    Pain Score 6     Pain Location Back                [facility] PT Assessment - 12/03/21 0001       Assessment   Medical Diagnosis h/o stroke with residual effects    Referring Provider (PT) Arva Chafe, DO    Onset Date/Surgical Date 02/02/21  Hand Dominance Right      Precautions   Precautions Fall      Restrictions   Weight Bearing Restrictions No                           [facility] Adult PT Treatment/Exercise - 12/03/21 0001       Exercises   Exercises Lumbar;Knee/Hip      Lumbar Exercises: Standing   Row Strengthening;Both;20 reps;Theraband    Theraband Level (Row) Level 3 ([place])    Row Limitations seated, cues to sit up and engage core      Lumbar Exercises: Seated   Other Seated Lumbar Exercises seated shoulder bil ER x 15 GTB, seated shoulder abduction x 20 GTB, seated bicep curls x 20 GTB, seated scapular protraction x 10 GTB      Knee/Hip Exercises: Aerobic   Nustep L6x29min      Knee/Hip Exercises: Seated   Other Seated Knee/Hip Exercises hip ER YTB around ankles x 10    Other Seated Knee/Hip Exercises ankle PF with GTB x 20    Marching  Strengthening;Both;5 reps    Marching Limitations YTB    Abduction/Adduction  Strengthening;10 reps    Abd/Adduction Limitations YTB    Sit to Sand 5 reps;with UE support                     PT Education - 12/03/21 1455     Education Details Access Code: [email or web address]  exercises for UE strengthening, given GTB with handles attached.  Given information on soft AFO brace as well.    Person(s) Educated Patient    Methods Explanation;Demonstration;Handout;Verbal cues    Comprehension Verbalized understanding;Returned demonstration              PT Short Term Goals - 11/26/21 1417       PT SHORT TERM GOAL #1   Title Ind and compliant with initial HEP    Time 4    Period Weeks    Status Achieved   2/7   Target Date 11/05/21      PT SHORT TERM GOAL #2   Title Improved 5x sit to stand to <= 15 sec    Time 4    Period Weeks    Status On-going   25 sec     PT SHORT TERM GOAL #3   Title Improved TUG to <= 16 sec    Time 4    Period Weeks    Status On-going   28.3 sec              PT Long Term Goals - 12/03/21 1410       PT LONG TERM GOAL #1   Title Ind and compliant with HEP to maintain therapy gains    Time 8    Period Weeks    Status On-going   12/03/21- met for current   Target Date 01/28/22      PT LONG TERM GOAL #2   Title Patient able to safely ambulate at home in community with least restrictive AD and a normal gait pattern including good heel strike bil (with or without AFO).    Time 8    Period Weeks    Status On-going   12/03/21- still requires rollator/walker.   Target Date 01/28/22      PT LONG TERM GOAL #3   Title Patient to demo increased bil LE strength to allow him to don/doff his pants  independently and apply pressure to gas/brake pedals to allow return to driving.    Time 8    Period Weeks    Status On-going   2/7- see flowsheet  12/03/21- still needs assistance with donning/doffing pans.   Target Date 01/28/22      PT LONG TERM GOAL  #4   Title Patient to demonstrate improved 5x sit to stand to <= 12 sec and TUG to <= 10 sec (with rollator) to decrease risk of falls.    Baseline 5x STS 19.43 seconds, TUG 26.5 seconds with rollator    Time 8    Period Weeks    Status On-going   12/03/21- 5x STS  25 seconds with UE support.  very fatigued.   Target Date 01/28/22      PT LONG TERM GOAL #5   Title Patient able to get in/out of bed independently at home.    Time 8    Period Weeks    Status Achieved   11/04/21   Target Date 01/28/22      PT LONG TERM GOAL #6   Title Patient able to demo increased LE strength by being able to ascend/descend his stairs at home with a reciprocal gait pattern using railing.    Time 8    Period Weeks    Status On-going   12/03/21- has 3 steps at home uses step to gait and HR.   Target Date 01/28/22                   Plan - 12/03/21 1456     Clinical Impression Statement Patient still demonstrates significant weakness and fatigue.  Today he reported more soreness in both legs and weakness, and requested to focus on upper body strengthening today.  His 5x STS score was worse than initial evaluation, 25 seconds needed, again due to increased complaint of pain and fatigue today.  He is compliant with HEP and reports that these are still very challenging but not as hard as they were initially.  He is able to get into bed now by himself, meeting LTG #5.  Recommend continued skilled therapy 2x/week for additional 8 weeks to continue strengthening and improving functional mobility, as he is still at high risk for falls.  Discussed soft AFO brace today with wife, as he has stopped wearing his custom AFO after it cut his foot, and she will have her son order from [place].    Personal Factors and Comorbidities Comorbidity 3+;Time since onset of injury/illness/exacerbation;Behavior Pattern    Comorbidities T2DM, HTN, spinal fusion 4/22, heart murmur, brain bleed Jan 2020, RA, neuropathy    PT  Frequency 2x / week    PT Duration 8 weeks    PT Treatment/Interventions ADLs/Self Care Home Management;Aquatic Therapy;Biofeedback;Cryotherapy;Electrical Stimulation;Moist Heat;Neuromuscular re-education;Balance training;Therapeutic exercise;Therapeutic activities;Functional mobility training;Stair training;Gait training;Patient/family education;Manual techniques;Dry needling;Passive range of motion;Taping    PT Next Visit Plan Recert; LE strengthening (including ankles) using machines for greater resistance and cardio equipment; balance/gait/stairs; see functional goals    PT Home Exercise Plan [email or web address]    Consulted and Agree with Plan of Care Patient             Patient will benefit from skilled therapeutic intervention in order to improve the following deficits and impairments:  Abnormal gait, Decreased range of motion, Pain, Decreased activity tolerance, Decreased balance, Impaired flexibility, Decreased strength  Visit Diagnosis: Muscle weakness (generalized)  Foot drop, left  Unsteadiness on feet  Other abnormalities of gait and mobility  Chronic midline low back pain, unspecified whether sciatica present     Problem List Patient Active Problem List   Diagnosis Date Noted   Foot drop, left foot 10/02/2021   History of colonic polyps 10/02/2021   History of rheumatic fever 10/02/2021   Stress fracture of left foot 09/03/2021   Gabapentin overdose, accidental or unintentional, initial encounter 03/08/2021   TIA (transient ischemic attack) 03/05/2021   Body mass index (BMI) 36.0-36.9, adult 10/03/2020   Subdural hemorrhage following injury without open intracranial wound and with prolonged loss of consciousness (more than 24 hours) without return to pre-existing conscious level (HCC) 10/03/2020   AKI (acute kidney injury) (HCC)    Acute encephalopathy    SDH (subdural hematoma) 10/24/2019   Cyclic vomiting syndrome 08/06/2019   Neuropathy 12/25/2018   AK (actinic  keratosis) 08/24/2018   Neoplasm of uncertain behavior 07/27/2018   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   Tendinopathy of right gluteus medius 07/13/2018   Tendinopathy of left gluteus medius 07/13/2018   Squamous cell carcinoma in situ (SCCIS) of skin 03/24/2018   Vitamin D deficiency 11/29/2017   Stage 3 chronic kidney disease (HCC) 10/28/2017   Primary osteoarthritis of right hip 09/11/2017   Essential hypertension 07/18/2017   Hyperlipidemia associated with type 2 diabetes mellitus (HCC) 07/18/2017   Type 2 diabetes mellitus with diabetic neuropathy, unspecified (HCC) 07/18/2017   Heart murmur 07/18/2017   Hypertriglyceridemia 07/18/2017   History of stroke 07/18/2017   DDD (degenerative disc disease), lumbar 06/15/2017   Iron deficiency anemia 06/15/2017   Stroke (HCC) 06/15/2017   Encounter for hepatitis C screening test for low risk patient 06/30/2016   Microalbuminuria due to type 2 diabetes mellitus (HCC) 03/19/2016   Hypertrophy of inferior nasal turbinate 12/20/2015   Disease of nasal cavity and sinuses 12/10/2015   Degenerative tear of acetabular labrum of left hip 11/04/2015   History of tobacco abuse 05/15/2015   Morbid (severe) obesity due to excess calories (HCC) 04/11/2015   Disorder of both eustachian tubes 01/27/2015   Maxillary sinusitis 01/13/2015   Lipoprotein deficiency 01/31/2014   Gastro-esophageal reflux disease without esophagitis 11/02/2013   Rheumatic fever 11/02/2013   Erectile dysfunction associated with type 2 diabetes mellitus (HCC) 07/31/2013   Type 2 diabetes mellitus with hyperglycemia, without long-term current use of insulin (HCC) 11/16/2012    Jena Gauss, PT 12/03/2021, 3:04 PM  [facility] [address]  [address] [place], [place], [address] Phone: [phone]   Fax:  [phone]  Name: Tommy Green MRN: [id number] Date of Birth: May 18, 1946

## 2021-12-03 NOTE — Patient Instructions (Signed)
Access Code: [email or web address] URL: [email or web address] Date: 12/03/2021 Prepared by: Glenetta Hew  Exercises Seated Shoulder Abduction with Resistance - 1 x daily - 7 x weekly - 2 sets - 10 reps Seated Elbow Flexion with Resistance Under Foot - 1 x daily - 7 x weekly - 2 sets - 10 reps Seated Scapular Protraction with Resistance - 1 x daily - 7 x weekly - 2 sets - 10 reps Seated Shoulder Row with Anchored Resistance - 1 x daily - 7 x weekly - 2 sets - 10 reps Seated Eccentric Ankle Plantar Flexion with Resistance - Straight Leg - 1 x daily - 7 x weekly - 2 sets - 10 reps

## 2021-12-08 DIAGNOSIS — M4320 Fusion of spine, site unspecified: Secondary | ICD-10-CM | POA: Diagnosis not present

## 2021-12-09 ENCOUNTER — Other Ambulatory Visit: Payer: Self-pay

## 2021-12-09 ENCOUNTER — Observation Stay (HOSPITAL_COMMUNITY)
Admission: EM | Admit: 2021-12-09 | Discharge: 2021-12-12 | Disposition: A | Discharge status: Home / Self Care | Payer: Medicare HMO | Attending: Internal Medicine | Admitting: Internal Medicine

## 2021-12-09 ENCOUNTER — Emergency Department (HOSPITAL_COMMUNITY): Payer: Medicare HMO

## 2021-12-09 ENCOUNTER — Encounter (HOSPITAL_COMMUNITY): Payer: Self-pay

## 2021-12-09 DIAGNOSIS — Z20822 Contact with and (suspected) exposure to covid-19: Secondary | ICD-10-CM | POA: Diagnosis not present

## 2021-12-09 DIAGNOSIS — R42 Dizziness and giddiness: Secondary | ICD-10-CM | POA: Diagnosis not present

## 2021-12-09 DIAGNOSIS — R531 Weakness: Secondary | ICD-10-CM | POA: Diagnosis not present

## 2021-12-09 DIAGNOSIS — Z87891 Personal history of nicotine dependence: Secondary | ICD-10-CM | POA: Diagnosis not present

## 2021-12-09 DIAGNOSIS — I060 Rheumatic aortic stenosis: Secondary | ICD-10-CM

## 2021-12-09 DIAGNOSIS — R55 Syncope and collapse: Secondary | ICD-10-CM | POA: Diagnosis not present

## 2021-12-09 DIAGNOSIS — M0579 Rheumatoid arthritis with rheumatoid factor of multiple sites without organ or systems involvement: Secondary | ICD-10-CM

## 2021-12-09 DIAGNOSIS — I350 Nonrheumatic aortic (valve) stenosis: Secondary | ICD-10-CM

## 2021-12-09 DIAGNOSIS — M21372 Foot drop, left foot: Secondary | ICD-10-CM | POA: Diagnosis not present

## 2021-12-09 DIAGNOSIS — E1165 Type 2 diabetes mellitus with hyperglycemia: Secondary | ICD-10-CM

## 2021-12-09 DIAGNOSIS — E1140 Type 2 diabetes mellitus with diabetic neuropathy, unspecified: Secondary | ICD-10-CM | POA: Diagnosis present

## 2021-12-09 DIAGNOSIS — E669 Obesity, unspecified: Secondary | ICD-10-CM

## 2021-12-09 DIAGNOSIS — M069 Rheumatoid arthritis, unspecified: Secondary | ICD-10-CM | POA: Diagnosis present

## 2021-12-09 DIAGNOSIS — Z743 Need for continuous supervision: Secondary | ICD-10-CM | POA: Diagnosis not present

## 2021-12-09 DIAGNOSIS — E1122 Type 2 diabetes mellitus with diabetic chronic kidney disease: Secondary | ICD-10-CM | POA: Diagnosis not present

## 2021-12-09 DIAGNOSIS — G4489 Other headache syndrome: Secondary | ICD-10-CM | POA: Diagnosis not present

## 2021-12-09 DIAGNOSIS — Z6830 Body mass index (BMI) 30.0-30.9, adult: Secondary | ICD-10-CM | POA: Diagnosis not present

## 2021-12-09 DIAGNOSIS — I10 Essential (primary) hypertension: Secondary | ICD-10-CM | POA: Diagnosis present

## 2021-12-09 DIAGNOSIS — Z79899 Other long term (current) drug therapy: Secondary | ICD-10-CM | POA: Insufficient documentation

## 2021-12-09 DIAGNOSIS — Z85828 Personal history of other malignant neoplasm of skin: Secondary | ICD-10-CM | POA: Diagnosis not present

## 2021-12-09 DIAGNOSIS — E781 Pure hyperglyceridemia: Secondary | ICD-10-CM | POA: Diagnosis not present

## 2021-12-09 DIAGNOSIS — I129 Hypertensive chronic kidney disease with stage 1 through stage 4 chronic kidney disease, or unspecified chronic kidney disease: Secondary | ICD-10-CM | POA: Insufficient documentation

## 2021-12-09 DIAGNOSIS — I2510 Atherosclerotic heart disease of native coronary artery without angina pectoris: Principal | ICD-10-CM | POA: Insufficient documentation

## 2021-12-09 DIAGNOSIS — N183 Chronic kidney disease, stage 3 unspecified: Secondary | ICD-10-CM | POA: Diagnosis present

## 2021-12-09 DIAGNOSIS — Z794 Long term (current) use of insulin: Secondary | ICD-10-CM | POA: Diagnosis not present

## 2021-12-09 DIAGNOSIS — D509 Iron deficiency anemia, unspecified: Secondary | ICD-10-CM

## 2021-12-09 DIAGNOSIS — R519 Headache, unspecified: Secondary | ICD-10-CM | POA: Diagnosis not present

## 2021-12-09 LAB — URINALYSIS, ROUTINE W REFLEX MICROSCOPIC
Bilirubin Urine: NEGATIVE
Glucose, UA: NEGATIVE mg/dL
Hgb urine dipstick: NEGATIVE
Ketones, ur: NEGATIVE mg/dL
Leukocytes,Ua: NEGATIVE
Nitrite: NEGATIVE
Protein, ur: NEGATIVE mg/dL
Specific Gravity, Urine: 1.005 (ref 1.005–1.030)
pH: 6 (ref 5.0–8.0)

## 2021-12-09 LAB — BASIC METABOLIC PANEL
Anion gap: 10 (ref 5–15)
BUN: 33 mg/dL — ABNORMAL HIGH (ref 8–23)
CO2: 22 mmol/L (ref 22–32)
Calcium: 8.7 mg/dL — ABNORMAL LOW (ref 8.9–10.3)
Chloride: 103 mmol/L (ref 98–111)
Creatinine, Ser: 1.5 mg/dL — ABNORMAL HIGH (ref 0.61–1.24)
GFR, Estimated: 48 mL/min — ABNORMAL LOW (ref >60)
Glucose, Bld: 143 mg/dL — ABNORMAL HIGH (ref 70–99)
Potassium: 4.2 mmol/L (ref 3.5–5.1)
Sodium: 135 mmol/L (ref 135–145)

## 2021-12-09 LAB — CBC
HCT: 30.8 % — ABNORMAL LOW (ref 39.0–52.0)
Hemoglobin: 9.7 g/dL — ABNORMAL LOW (ref 13.0–17.0)
MCH: 25.6 pg — ABNORMAL LOW (ref 26.0–34.0)
MCHC: 31.5 g/dL (ref 30.0–36.0)
MCV: 81.3 fL (ref 80.0–100.0)
Platelets: 276 10*3/uL (ref 150–400)
RBC: 3.79 MIL/uL — ABNORMAL LOW (ref 4.22–5.81)
RDW: 16.4 % — ABNORMAL HIGH (ref 11.5–15.5)
WBC: 8 10*3/uL (ref 4.0–10.5)
nRBC: 0 % (ref 0.0–0.2)

## 2021-12-09 LAB — GLUCOSE, CAPILLARY
Glucose-Capillary: 145 mg/dL — ABNORMAL HIGH (ref 70–99)
Glucose-Capillary: 136 mg/dL — ABNORMAL HIGH (ref 70–99)

## 2021-12-09 LAB — RESP PANEL BY RT-PCR (FLU A&B, COVID) ARPGX2
Influenza A by PCR: NEGATIVE
Influenza B by PCR: NEGATIVE
SARS Coronavirus 2 by RT PCR: NEGATIVE

## 2021-12-09 LAB — CBG MONITORING, ED: Glucose-Capillary: 124 mg/dL — ABNORMAL HIGH (ref 70–99)

## 2021-12-09 IMAGING — CT CT HEAD W/O CM
4 series · 17 of 47 positions shown, 19 images · non-contrast
Comparison: CT head [DATE]

CLINICAL DATA: Frontal headache and dizziness



[Series 3: head bone · axial · 0.47mm/px · z∈[-104,-42]mm · 4 of 92 slices shown]
[im 10/92  bone]
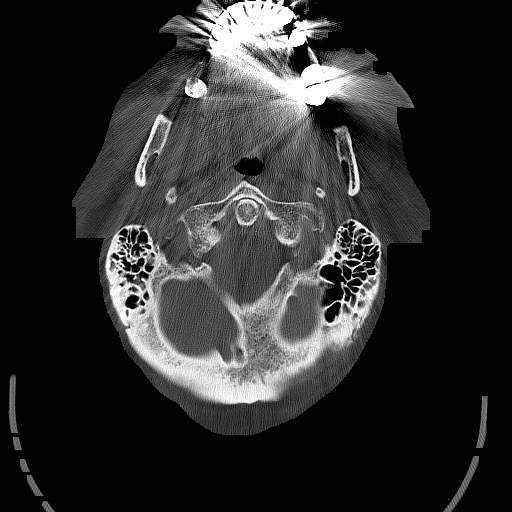
[im 19/92  bone]
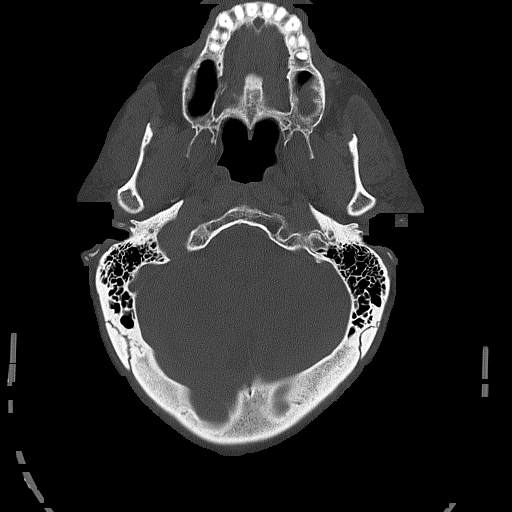
[im 28/92  bone]
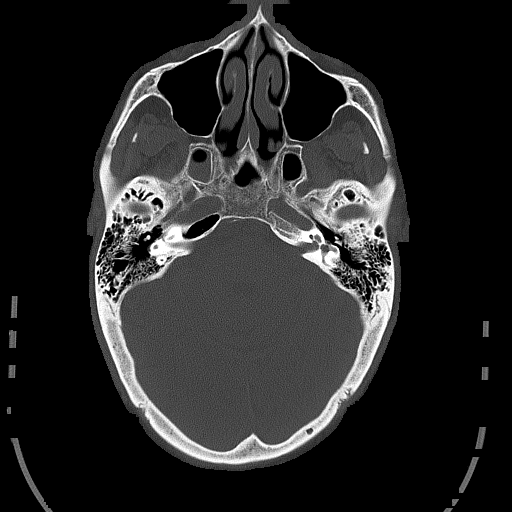
[im 41/92  bone]
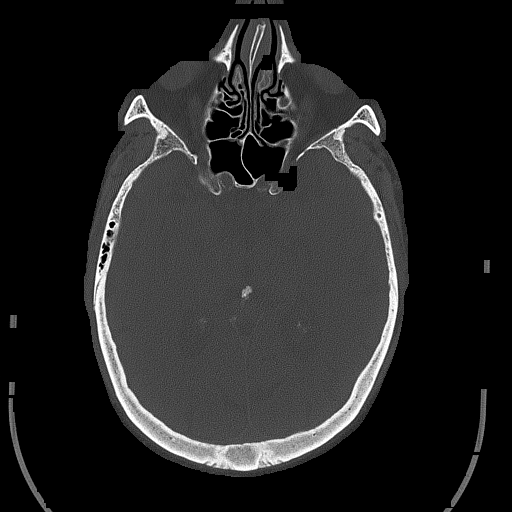

[Series 4: head without · axial · non-contrast · 0.47mm/px · z∈[-102,+32]mm · 7 of 37 slices shown, 9 images]
[im 5/37  brain]
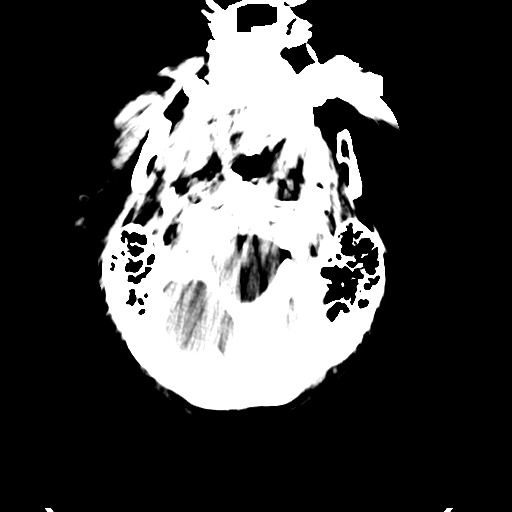
[im 5/37  bone]
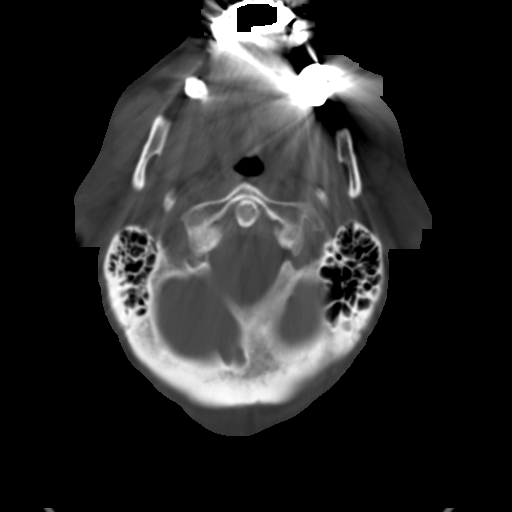
[im 10/37  brain]
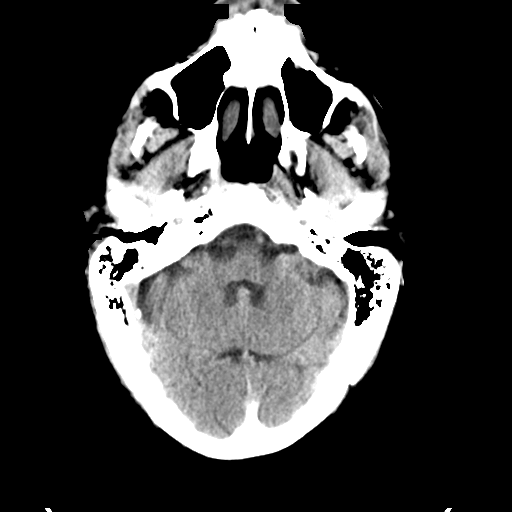
[im 14/37  brain]
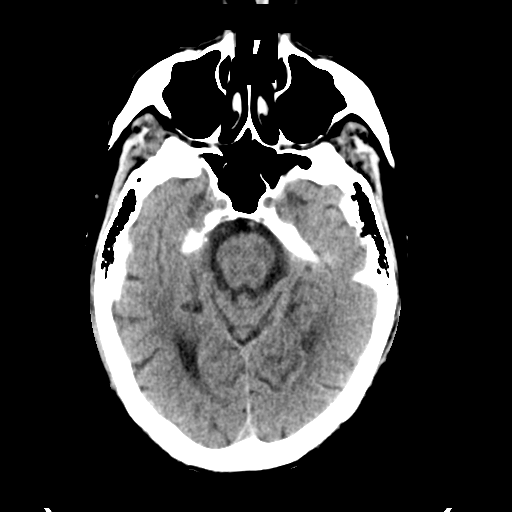
[im 19/37  brain]
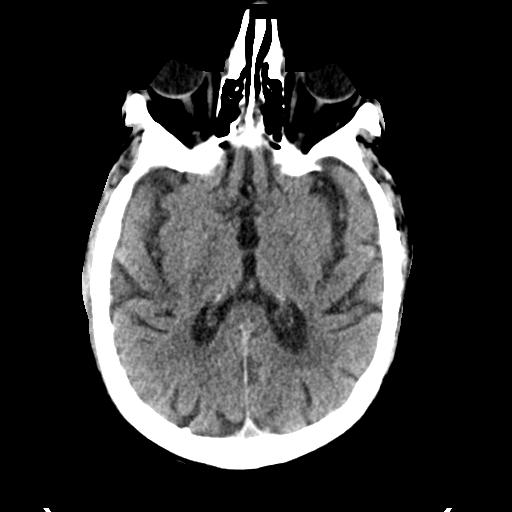
[im 23/37  brain]
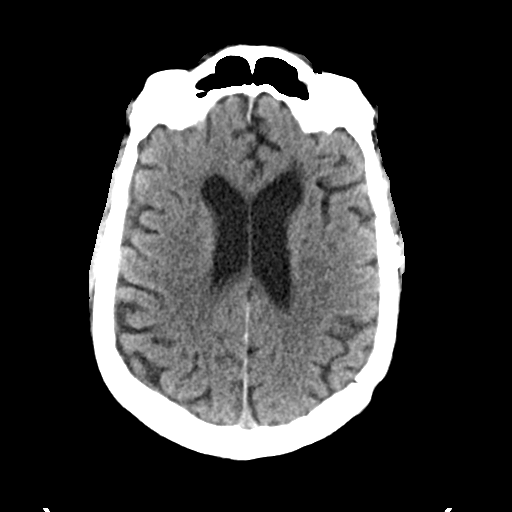
[im 23/37  bone]
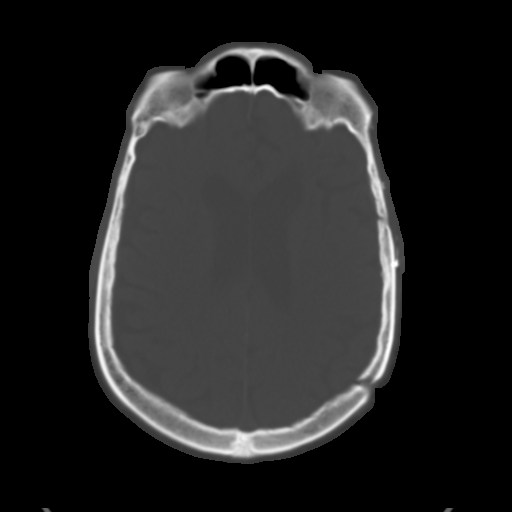
[im 28/37  brain]
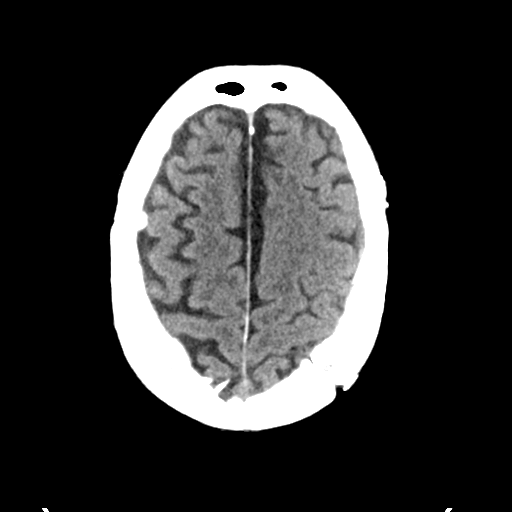
[im 32/37  brain]
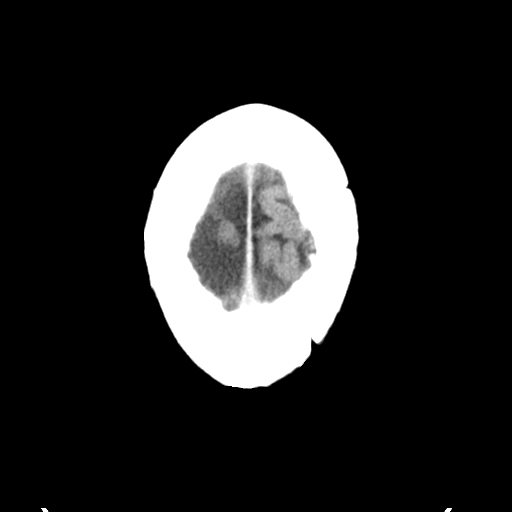

[Series 5: head without cor · coronal · non-contrast · 0.40mm/px · 3 of 75 slices shown]
[im 25/75  brain]
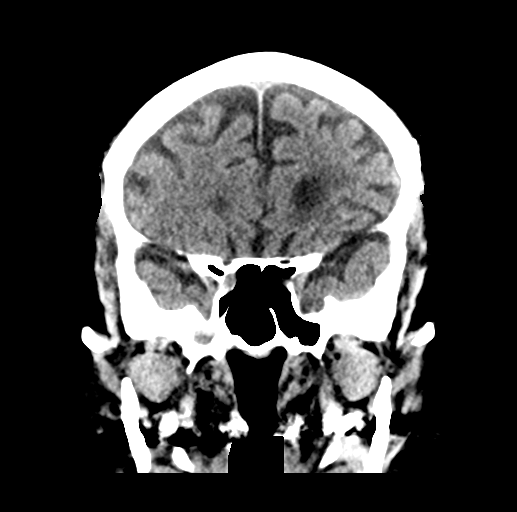
[im 33/75  brain]
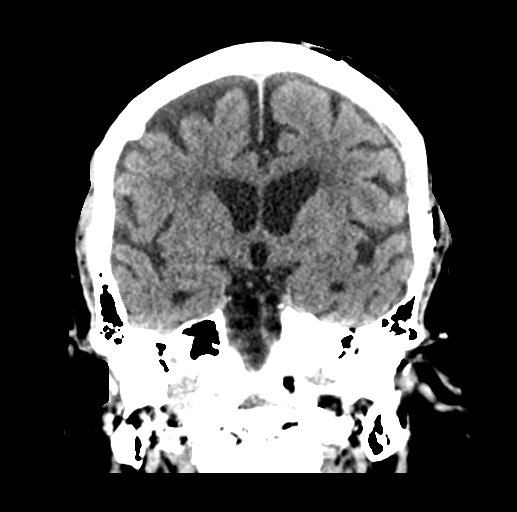
[im 42/75  brain]
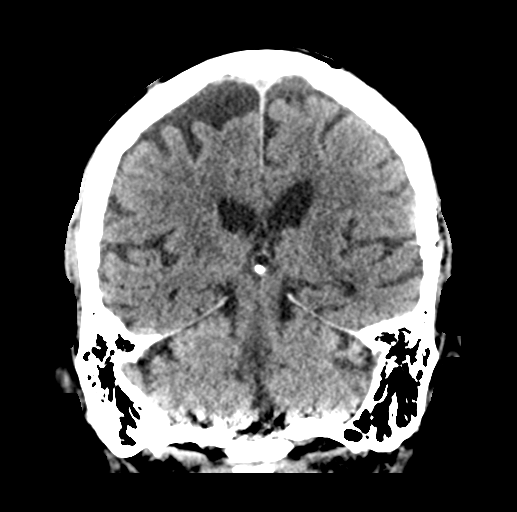

[Series 6: head without sag · sagittal · non-contrast · 0.39mm/px · 3 of 57 slices shown]
[im 19/57  brain]
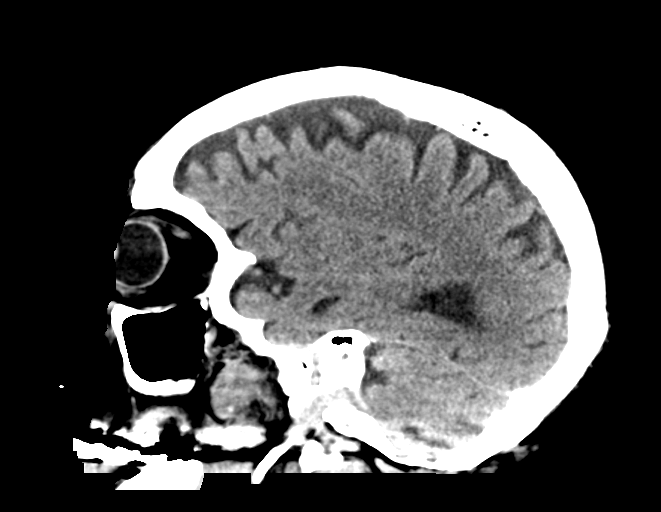
[im 29/57  brain]
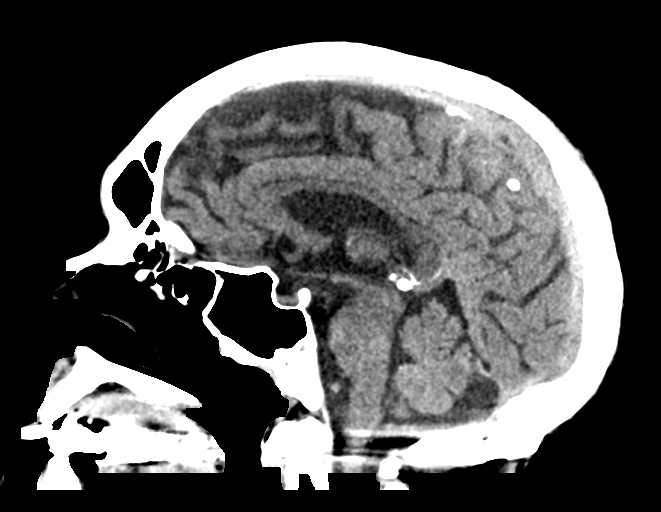
[im 38/57  brain]
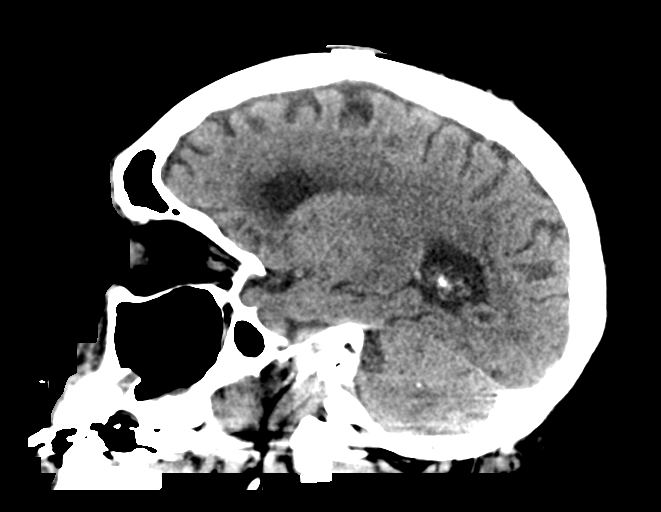

[17 of 47 positions shown; findings below may reference images not displayed]

FINDINGS: Brain: Generalized atrophy. Chronic infarct left frontal lobe
unchanged.

Negative for acute infarct, hemorrhage, mass

Vascular: Negative for hyperdense vessel

Skull: Left parietal craniotomy. Craniotomy flap in good position.
No acute skeletal abnormality

Sinuses/Orbits: Mild mucosal edema paranasal sinuses. Air-fluid
level right sphenoid sinus. Negative orbit

Other: None
IMPRESSION: Atrophy and chronic ischemic change.  No acute abnormality.

## 2021-12-09 MED ORDER — ALBUTEROL SULFATE (2.5 MG/3ML) 0.083% IN NEBU: 2.5000 mg | INHALATION_SOLUTION | Freq: Four times a day (QID) | RESPIRATORY_TRACT | Status: DC | PRN

## 2021-12-09 MED ORDER — HYDROCODONE-ACETAMINOPHEN 10-325 MG PO TABS: 0.5000 | ORAL_TABLET | Freq: Every day | ORAL | Status: DC | PRN

## 2021-12-09 MED ORDER — ASPIRIN EC 81 MG PO TBEC: 162.0000 mg | DELAYED_RELEASE_TABLET | Freq: Four times a day (QID) | ORAL | Status: DC | PRN

## 2021-12-09 MED ORDER — GUAIFENESIN ER 600 MG PO TB12
600.0000 mg | ORAL_TABLET | Freq: Two times a day (BID) | ORAL | Status: DC
  Administered 2021-12-09 – 2021-12-12 (×6): 600 mg via ORAL
  Filled 2021-12-09 (×6): qty 1

## 2021-12-09 MED ORDER — SODIUM CHLORIDE 0.9% FLUSH
3.0000 mL | Freq: Two times a day (BID) | INTRAVENOUS | Status: DC
  Administered 2021-12-09 – 2021-12-12 (×5): 3 mL via INTRAVENOUS

## 2021-12-09 MED ORDER — ONDANSETRON HCL 4 MG/2ML IJ SOLN: 4.0000 mg | Freq: Four times a day (QID) | INTRAMUSCULAR | Status: DC | PRN

## 2021-12-09 MED ORDER — PANTOPRAZOLE SODIUM 40 MG PO TBEC
40.0000 mg | DELAYED_RELEASE_TABLET | Freq: Every day | ORAL | Status: DC
Start: 2021-12-09 — End: 2021-12-12
  Administered 2021-12-09 – 2021-12-12 (×4): 40 mg via ORAL
  Filled 2021-12-09 (×4): qty 1

## 2021-12-09 MED ORDER — ACETAMINOPHEN 325 MG PO TABS: 650.0000 mg | ORAL_TABLET | Freq: Four times a day (QID) | ORAL | Status: DC | PRN

## 2021-12-09 MED ORDER — LISINOPRIL 20 MG PO TABS
40.0000 mg | ORAL_TABLET | Freq: Every day | ORAL | Status: DC
Start: 2021-12-10 — End: 2021-12-12
  Administered 2021-12-10 – 2021-12-12 (×3): 40 mg via ORAL
  Filled 2021-12-09 (×3): qty 2

## 2021-12-09 MED ORDER — FENOFIBRATE 160 MG PO TABS
160.0000 mg | ORAL_TABLET | Freq: Every day | ORAL | Status: DC
Start: 2021-12-10 — End: 2021-12-12
  Administered 2021-12-10 – 2021-12-12 (×3): 160 mg via ORAL
  Filled 2021-12-09 (×3): qty 1

## 2021-12-09 MED ORDER — LEFLUNOMIDE 20 MG PO TABS
10.0000 mg | ORAL_TABLET | Freq: Every day | ORAL | Status: DC
Start: 2021-12-10 — End: 2021-12-12
  Administered 2021-12-10 – 2021-12-12 (×2): 10 mg via ORAL
  Filled 2021-12-09 (×3): qty 0.5

## 2021-12-09 MED ORDER — EZETIMIBE 10 MG PO TABS
10.0000 mg | ORAL_TABLET | Freq: Every day | ORAL | Status: DC
Start: 2021-12-09 — End: 2021-12-12
  Administered 2021-12-09 – 2021-12-11 (×3): 10 mg via ORAL
  Filled 2021-12-09 (×3): qty 1

## 2021-12-09 MED ORDER — GABAPENTIN 100 MG PO CAPS
200.0000 mg | ORAL_CAPSULE | Freq: Every day | ORAL | Status: DC
  Administered 2021-12-09 – 2021-12-11 (×3): 200 mg via ORAL
  Filled 2021-12-09 (×3): qty 2

## 2021-12-09 MED ORDER — AMLODIPINE BESYLATE 5 MG PO TABS
5.0000 mg | ORAL_TABLET | Freq: Every day | ORAL | Status: DC
  Administered 2021-12-09 – 2021-12-11 (×3): 5 mg via ORAL
  Filled 2021-12-09 (×3): qty 1

## 2021-12-09 MED ORDER — MELATONIN 5 MG PO TABS
5.0000 mg | ORAL_TABLET | Freq: Every day | ORAL | Status: DC
Start: 2021-12-09 — End: 2021-12-12
  Administered 2021-12-09 – 2021-12-11 (×3): 5 mg via ORAL
  Filled 2021-12-09 (×3): qty 1

## 2021-12-09 MED ORDER — INSULIN GLARGINE-YFGN 100 UNIT/ML ~~LOC~~ SOLN
30.0000 [IU] | Freq: Every day | SUBCUTANEOUS | Status: DC
  Administered 2021-12-10: 21:00:00 20 [IU] via SUBCUTANEOUS
  Administered 2021-12-11: 30 [IU] via SUBCUTANEOUS
  Filled 2021-12-09 (×3): qty 0.3

## 2021-12-09 MED ORDER — MAGNESIUM OXIDE -MG SUPPLEMENT 400 (240 MG) MG PO TABS
400.0000 mg | ORAL_TABLET | Freq: Every day | ORAL | Status: DC
Start: 2021-12-10 — End: 2021-12-12
  Administered 2021-12-10 – 2021-12-11 (×2): 400 mg via ORAL
  Filled 2021-12-09 (×3): qty 1

## 2021-12-09 MED ORDER — ENOXAPARIN SODIUM 60 MG/0.6ML IJ SOSY
50.0000 mg | PREFILLED_SYRINGE | INTRAMUSCULAR | Status: DC
  Administered 2021-12-09 – 2021-12-10 (×2): 50 mg via SUBCUTANEOUS
  Filled 2021-12-09 (×2): qty 0.6

## 2021-12-09 MED ORDER — ONDANSETRON HCL 4 MG PO TABS: 4.0000 mg | ORAL_TABLET | Freq: Four times a day (QID) | ORAL | Status: DC | PRN

## 2021-12-09 MED ORDER — INSULIN ASPART 100 UNIT/ML IJ SOLN
0.0000 [IU] | Freq: Three times a day (TID) | INTRAMUSCULAR | Status: DC
  Administered 2021-12-10: 2 [IU] via SUBCUTANEOUS
  Administered 2021-12-10 (×2): 1 [IU] via SUBCUTANEOUS
  Administered 2021-12-11: 2 [IU] via SUBCUTANEOUS
  Administered 2021-12-11 – 2021-12-12 (×3): 1 [IU] via SUBCUTANEOUS

## 2021-12-09 MED ORDER — HYDROCODONE-ACETAMINOPHEN 10-325 MG PO TABS
0.5000 | ORAL_TABLET | Freq: Every day | ORAL | Status: DC
  Administered 2021-12-09 – 2021-12-11 (×3): 0.5 via ORAL
  Filled 2021-12-09 (×3): qty 1

## 2021-12-09 MED ORDER — ACETAMINOPHEN 650 MG RE SUPP: 650.0000 mg | Freq: Four times a day (QID) | RECTAL | Status: DC | PRN

## 2021-12-09 NOTE — Assessment & Plan Note (Addendum)
-  On admission blood sugar was noted to be 143. -Home insulin regimen includes Tresiba 30 units nightly and sliding scale of insulin and 0 to 10 units with meals. -C/w Hypoglycemic protocols -CBGs before every meal with sensitive sliding scale of insulin -Continue Gabapentin for Neuropathy -CBG's ranging from 126-170 -Resumed Tresiba substitution with Semglee 30 units Daily and continue to monitor in outpatient setting

## 2021-12-09 NOTE — Assessment & Plan Note (Addendum)
-  Patient presents with complaints of dizziness and states that he felt like he could have passed out after standing this morning.   -Family question if symptoms could be secondary to patient's aortic stenosis for which EMS was called.   -CT imaging of the brain did not note any acute abnormality. -Repeat Orthostatic in the AM  -Will order TED Hose and if necessary Abdominal Binder -Continue Telemetry Monitoring   -PT Evaluating and recommending Outpatient PT -Appreciate Cardiology consultative services and they are planning a RHC and Coronary Angiography Study tomorrow with outpatient CT Scan and Cardiothoracic Surgery Consultation -[place] cath done and showed "  Ost LAD to Prox LAD lesion is 50% stenosed.   Mid LAD lesion is 50% stenosed.   Mid RCA lesion is 50% stenosed.   Aortic saturation 98%, PA saturation 65%, PA pressure 19/6, mean PA pressure 11 mmHg, mean pulmonary capillary wedge pressure 9 mmHg, cardiac output 6.6 L/min, cardiac index 3.0.   Severe right subclavian tortuosity.  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  Moderate, nonobstructive coronary artery disease.  Continue with plans for TAVR evaluation.  Plan discussed with Dr. Ali Lowe.  Severe tortuosity of the right subclavian.  If cardiac cath was attempted in the future, would use a different approach.  If right radial had to be used, would need an 85 cm destination sheath.  75 cm sheath was too short.  Gentle post-cath hydration due to renal insufficiency. -Repeat Orthostatic VS and he did not feel dizzy.  Able to use his wrist and will discharge home now

## 2021-12-09 NOTE — ED Provider Notes (Signed)
[facility] EMERGENCY DEPARTMENT Provider Note   CSN: [id number] Arrival date & time: 12/09/21  1029     History  Chief Complaint  Patient presents with   Dizziness    Tommy Green is a 76 y.o. male.   Dizziness  76 year old male with significant history of diabetes, hypertension, hyperlipidemia, prior stroke, skin cancer, iron deficiency anemia, obesity who was brought here via EMS from home for evaluation of dizziness.  Patient reports this morning when he woke up he noticed that he felt dizzy.  He described more as a lightheaded sensation as if he is going to pass out.  He ate some breakfast and shortly after he felt little bit better.  He does endorse some mild throbbing headache described more as a bandlike sensation across his head as well as having ongoing sinus congestion but this is not new.  He also endorsed having sporadic nosebleed from both nares that is short lasting and without any trauma.  Feels like his nose is dry.  As far as his headache he does not endorse any aura, having light or sound sensitivity.  He did try taking Tylenol initially that did not seem to help.  His wife gave him 2 Dramamine and now he feels better.  Endorse occasional dry cough.  Does not complain of any fever or chills no change in appetite, no hearing changes, no facial droop or facial numbness no confusion arm or leg weakness.  He denies any chest pain or trouble breathing.  Headache is mild at this time.  Denies any urinary discomfort.  Patient normally walks with a walker because he had a left foot drop from a prior back surgery 2 years ago.  History of TIA without history of stroke.  He is not on any blood thinner medication     Home Medications Prior to Admission medications   Medication Sig Start Date End Date Taking? Authorizing Provider  acetaminophen (TYLENOL) 325 MG tablet Take 650 mg by mouth every 6 (six) hours as needed (for pain).    [provider]   amLODipine (NORVASC) 5 MG tablet Take 1 tablet (5 mg total) by mouth daily. 10/26/21   [name], DO  B Complex Vitamins (B COMPLEX PO) Take 1 tablet by mouth daily.    [provider]  Cholecalciferol (VITAMIN D3 SUPER STRENGTH) 50 MCG (2000 UT) CAPS Take 2,000 Units by mouth daily.    [provider]  Etanercept (ENBREL MINI) 50 MG/ML SOCT Inject 50 mg into the skin once a week. 09/17/21   [name], PA-C  ezetimibe (ZETIA) 10 MG tablet Take 1 tablet (10 mg total) by mouth daily. For elevated cholesterol and triglycerides 11/25/21   Wendling, Crosby Oyster, DO  fenofibrate micronized (LOFIBRA) 200 MG capsule TAKE 1 CAPSULE DAILY BEFOREBREAKFAST 07/06/21   [name], DO  gabapentin (NEURONTIN) 100 MG capsule TAKE 2 CAPSULES AT BEDTIME 07/13/21   Wendling, Crosby Oyster, DO  hydrochlorothiazide (HYDRODIURIL) 25 MG tablet Take 1 tablet (25 mg total) by mouth daily. 11/27/21   [name], DO  HYDROcodone-acetaminophen (NORCO/VICODIN) 5-325 MG tablet Take 1 tablet by mouth at bedtime as needed for moderate pain.    [provider]  insulin aspart (NOVOLOG FLEXPEN) 100 UNIT/ML FlexPen Inject 0-10 Units into the skin 3 (three) times daily with meals. Inject 0-10 units into the skin before meals, PER SLIDING SCALE: BGL 0-200 = give nothing, 201-250 = 2 units, 251-300 = 4 units, 301-350 =  6 units, 351-400 = 8 units, >400 = 10 units, push fluids, and repeat BGL check in 2 hours. If BGL remains >400 after 2 hours, CALL MD.    [provider]  insulin degludec (TRESIBA) 100 UNIT/ML FlexTouch Pen Inject 30 Units into the skin at bedtime. 03/10/21   Donne Hazel, MD  leflunomide (ARAVA) 10 MG tablet TAKE 1 TABLET BY MOUTH EVERY DAY 08/14/21   Bo Merino, MD  lisinopril (ZESTRIL) 40 MG tablet Take 40 mg by mouth daily.    [provider]  Magnesium 200 MG TABS Take 200 mg by mouth daily in the afternoon.    [provider]  melatonin 5 MG TABS Take 5 mg by mouth at bedtime as needed.    [provider]  Multiple Vitamins-Minerals (MULTIVITAMIN WITH MINERALS) tablet Take 1 tablet by mouth daily.    [provider]  Omega 3 1000 MG CAPS Take 1,000 mg by mouth daily.    [provider]  omeprazole (PRILOSEC) 20 MG capsule Take 1 capsule (20 mg total) by mouth daily before breakfast. 05/25/21   Wendling, Crosby Oyster, DO  [facility] VERIO test strip 4 (four) times daily. 06/14/20   [provider]  Potassium 99 MG TABS Take 99 mg by mouth daily.    [provider]  vitamin B-12 (CYANOCOBALAMIN) 1000 MCG tablet Take 1,000 mcg by mouth daily.    [provider]      Allergies    Fluzone quadrivalent [influenza vac split quad], Influenza vaccines, Aspirin, Atorvastatin, Canagliflozin, and Statins    Review of Systems   Review of Systems  Neurological:  Positive for dizziness.   Physical Exam Updated Vital Signs BP (!) 141/77 (BP Location: Right Arm)    Pulse 69    Resp 16    Ht 5\' 11"  (1.803 m)    Wt 101.4 kg    SpO2 99%    BMI 31.18 kg/m  Physical Exam Vitals and nursing note reviewed.  Constitutional:      Appearance: He is obese.  HENT:     Head: Normocephalic and atraumatic.     Ears:     Comments: R ear: cerumen impaction, TM not visualized L ear: normal TM    Nose: Congestion and rhinorrhea present.     Mouth/Throat:     Mouth: Mucous membranes are moist.  Eyes:     Extraocular Movements: Extraocular movements intact.     Pupils: Pupils are equal, round, and reactive to light.  Cardiovascular:     Rate and Rhythm: Normal rate and regular rhythm.     Heart sounds: Murmur heard.  Pulmonary:     Effort: Pulmonary effort is normal.     Breath sounds: Normal breath sounds. No wheezing, rhonchi or rales.  Abdominal:     Palpations: Abdomen is soft.     Tenderness: There is no abdominal tenderness.  Musculoskeletal:     Cervical  back: Normal range of motion. No rigidity or tenderness.     Comments: 5/5 strength to all 4 extremities  Neurological:     Mental Status: He is alert and oriented to person, place, and time.     GCS: GCS eye subscore is 4. GCS verbal subscore is 5. GCS motor subscore is 6.     Cranial Nerves: Cranial nerves 2-12 are intact.     Sensory: Sensation is intact.     Motor: Motor function is intact.     Comments: Walks with a  walker, antalgic gait due to L foot drops  Psychiatric:        Mood and Affect: Mood normal.     ED Results / Procedures / Treatments   Labs (all labs ordered are listed, but only abnormal results are displayed) Labs Reviewed  BASIC METABOLIC PANEL - Abnormal; Notable for the following components:      Result Value   Glucose, Bld 143 (*)    BUN 33 (*)    Creatinine, Ser 1.50 (*)    Calcium 8.7 (*)    GFR, Estimated 48 (*)    All other components within normal limits  CBC - Abnormal; Notable for the following components:   RBC 3.79 (*)    Hemoglobin 9.7 (*)    HCT 30.8 (*)    MCH 25.6 (*)    RDW 16.4 (*)    All other components within normal limits  RESP PANEL BY RT-PCR (FLU A&B, COVID) ARPGX2  URINALYSIS, ROUTINE W REFLEX MICROSCOPIC  CBG MONITORING, ED    EKG None ED ECG REPORT   Date: 12/09/2021  Rate: 66  Rhythm: normal sinus rhythm  QRS Axis: normal  Intervals: normal  ST/T Wave abnormalities: nonspecific ST/T changes  Conduction Disutrbances:none  Narrative Interpretation:   Old EKG Reviewed: unchanged  I have personally reviewed the EKG tracing and agree with the computerized printout as noted.   Radiology CT Head Wo Contrast  Result Date: 12/09/2021 CLINICAL DATA:  Frontal headache and dizziness EXAM: CT HEAD WITHOUT CONTRAST TECHNIQUE: Contiguous axial images were obtained from the base of the skull through the vertex without intravenous contrast. RADIATION DOSE REDUCTION: This exam was performed according to the departmental  dose-optimization program which includes automated exposure control, adjustment of the mA and/or kV according to patient size and/or use of iterative reconstruction technique. COMPARISON:  CT head 03/05/2021 FINDINGS: Brain: Generalized atrophy. Chronic infarct left frontal lobe unchanged. Negative for acute infarct, hemorrhage, mass Vascular: Negative for hyperdense vessel Skull: Left parietal craniotomy. Craniotomy flap in good position. No acute skeletal abnormality Sinuses/Orbits: Mild mucosal edema paranasal sinuses. Air-fluid level right sphenoid sinus. Negative orbit Other: None IMPRESSION: Atrophy and chronic ischemic change.  No acute abnormality. Electronically Signed   By: Franchot Gallo M.D.   On: 12/09/2021 11:52    Procedures Procedures    Medications Ordered in ED Medications - No data to display  ED Course/ Medical Decision Making/ A&P                           Medical Decision Making Amount and/or Complexity of Data Reviewed Labs: ordered.   BP (!) 141/77 (BP Location: Right Arm)    Pulse 69    Resp 16    Ht 5\' 11"  (1.803 m)    Wt 101.4 kg    SpO2 99%    BMI 31.18 kg/m   12:37 PM Naseem Ciaravino is a 76 y.o. male with a PMH of HTN, HLD, DM, TIA, stroke, subdural hemorrhage, and frequent sinus infections who presents to the ED today with concern for 2 weeks of sinus congestion and intermittent epistaxis, 1-2 days of headache, cough, and dizziness. His nosebleeds typically occur in the morning, last a few minutes without intervention, and do not require more than one tissue. He notes dry mouth and nose and watery eyes recently. He does not take any allergy medication. He describes the HA as pressure across his forehead and rates the pain as a 5-6/10. It  is not associated with an aura, n/v, or sensitivity to light or sound. He took Tylenol this morning which did not relieve his symptoms. His dizziness occurs mostly with movement and makes him feel as though he might pass out. He  has had appropriate appetite and oral intake. He denies f/c, ear pain or trouble hearing, CP, palpitations, and n/v.  On exam patient is overall well-appearing, no focal neurodeficit.  When ambulated with a walker he was able to support himself.  He has a left foot drop but this is not new.  His blood pressure is 141/77, no tachycardia, no hypoxia.  He described his dizziness more as a lightheadedness sensation without any vertiginous symptoms.  Initial head CT scan shows no acute finding.  Some air-fluid level noted in the sinus consistent with this recurrent sinus complaint.  The exam is limited due to impacted cerumen which may potentially contribute to his symptoms.  Patient however reports improvement of his symptoms after receiving Dramamine and having his breakfast.  His last known normal was at 8 PM last night when he went to sleep.  At this time I have low suspicion for acute stroke causing his presentation as there are no vertiginous symptoms and patient able to ambulate using his walker. HOwever, he does have moderate risk factors for potential stroke.  I did consult oncall neurologist Dr. Cheral Marker who felt sxs less likely to be a stroke.  If MRI is positive then he can be consulted.    Patient also has significant history of aortic stenosis with last cardiac echo a month ago that shows severe stenosis.  He does have a notable heart murmur on exam.  He is not hypotensive however given his presentation, I will consult cardiology and likely admit for further work-up of his near-syncope.  Care discussed with Dr. Rogene Houston  Labs was obtained independent review interpreted by me.  Urinalysis show no evidence of urinary tract infection, normal viral respiratory panel, negative for COVID and flu, evidence of impaired renal function with BUN 33, creatinine 1.5 which is actually an improvement compared to prior kidney function value.  Hemoglobin is 9.7, similar to prior.  Normal WBC.  An EKG obtained  without any concerning arrhythmia or ischemic changes   1:43 PM Appreciate consultation from cardiology who request medicine admission for further work-up of this near syncope in a patient with severe aortic stenosis.  From previous notes on 10/20/2021 by cardiology, it appears a conversation about TAVR may be indicated if this is recurrent.  Patient may benefit from a repeat echocardiogram.  I appreciate consultation from [facility] hospitalist, Dr. Tamala Julian who agrees to admit patient and will discuss with cardiology for further work-up.    This patient presents to the ED for concern of dizziness, this involves an extensive number of treatment options, and is a complaint that carries with it a high risk of complications and morbidity.  The differential diagnosis includes near syncope, cardiac valvular disease, dehydration, stroke, electrolytes derangement, cardiac arrhythmia, inner ear disease, cerumen impaction, space occupying lesion, sinusitis  Co morbidities that complicate the patient evaluation DM  TIA  HTN   Additional history obtained:  Additional history obtained from wife at bedside External records from outside source obtained and reviewed including PCP notes  Lab Tests:  I Ordered, and personally interpreted labs.  The pertinent results include:  as discussed above  Imaging Studies ordered:  I ordered imaging studies including head CT I independently visualized and interpreted imaging which showed no acute  finding I agree with the radiologist interpretation  Cardiac Monitoring:  The patient was maintained on a cardiac monitor.  I personally viewed and interpreted the cardiac monitored which showed an underlying rhythm of: sinus rhythm  Medicines ordered and prescription drug management:    Test Considered: brain MRI to r/o stroke but felt with his near syncope and hx of aortic stenosis that stroke is less likely  Critical Interventions: consultation of neurology,  cardiology and hospitalist  Consultations Obtained:  I requested consultation with the cardiology,  and discussed lab and imaging findings as well as pertinent plan - they recommend: medicine admission  Problem List / ED Course: near syncope  Reevaluation:  After the interventions noted above, I reevaluated the patient and found that they have :improved  Social Determinants of Health: age  Health literacy  Dispostion:  After consideration of the diagnostic results and the patients response to treatment, I feel that the patent would benefit from admission.         Final Clinical Impression(s) / ED Diagnoses Final diagnoses:  Near syncope    Rx / DC Orders ED Discharge Orders     None         Domenic Moras, PA-C 12/09/21 1410    Fredia Sorrow, MD 12/17/21 [phone]

## 2021-12-09 NOTE — Assessment & Plan Note (Addendum)
-  Continue Ezetimibe 10 mg po qHS and Fenofibrate 160 mg po Daily

## 2021-12-09 NOTE — Assessment & Plan Note (Addendum)
-  Blood pressures are currently maintained.  Home medication regimen includes Amlodipine 5 mg nightly, Lisinopril 40 mg q. Morning.   -He reports that he was taken off of carvedilol last month due to it possibly causing dry mouth symptoms. -Continue current blood pressure regimen -Continue to Monitor BP per Protocol and Last BP reading was 122/76

## 2021-12-09 NOTE — Assessment & Plan Note (Addendum)
-  Patient noted to have severe aortic stenosis by last echocardiogram.   -Currently being followed by cardiology for possible need of TAVR. -Cardiology consulted and will continue to engage patient in regards to further work-up and planning Right Heart Catheterization and Coronary Angiography Study and done today -See above and cardiology recommends continuing plans for TAVR evaluation and given that he had severe tortuosity of his right subclavian they recommended left cardiac catheter reattempt in the future and they will use a different approach -Further Workup in the outpatient Setting

## 2021-12-09 NOTE — H&P (Signed)
History and Physical    Patient: Tommy Green NID:[id number] DOB: 06/07/1946 DOA: 12/09/2021 DOS: the patient was seen and examined on 12/09/2021 PCP: [name], DO  Patient coming from: Home  Chief Complaint:  Chief Complaint  Patient presents with   Dizziness    HPI: Tommy Green is a 76 y.o. male with medical history significant of hypertension, hypertriglyceridemia, severe aortic stenosis, SDH, diabetes mellitus type 2, RA, anemia, skin cancer, and left dropfoot as a result complication of prior back surgery in 01/2021 who presents with complaints of dizziness.  Patient reports that he has not felt well for couple weeks now.  Upon wakening this morning he states that he felt dizzy and describes it as though he could have passed out.  Denies symptoms of feeling like prior symptoms of vertigo.  He has been having sinus congestion with postnasal drip.  At home his wife had given him Dramamine with some mild improvement in symptoms.  Patient notes that he chronically has lower extremity swelling that has not worsened.  He denies having any chest pain, palpitations, shortness of breath, or loss of consciousness.  This recently had echocardiogram performed on 10/21/2021 which noted EF of 50 -55% with Grade 1 diastolic dysfunction, severe aortic stenosis, and severely calcified aortic valve.  He reports that he is being followed by cardiology to be evaluated for the need for possible TAVR, but had had complications during his prior back surgery where he needed to be placed in the ICU on pressors.  Upon admission into the emergency department patient was noted to be afebrile with blood pressures 141/77 -162/84, and all other vital signs maintained.  CT scan of the head noted atrophy and chronic ischemic changes.  Labs noted hemoglobin 9.7, BUN 33, and creatinine 1.5.  Influenza and COVID-19 screening were negative.  TRH was called to admit.  Review of Systems: As mentioned in the  history of present illness. All other systems reviewed and are negative. Past Medical History:  Diagnosis Date   Anemia    Essential hypertension 07/18/2017   Foot drop, left foot    due to back surgery in 01/2021   History of colonic polyps    History of rheumatic fever    History of stroke 07/18/2017   Hypertriglyceridemia 07/18/2017   Type 2 diabetes mellitus with diabetic neuropathy, unspecified ([place]) 07/18/2017   Past Surgical History:  Procedure Laterality Date   COLONOSCOPY     around 2018 [facility]   COLONOSCOPY  09/02/2020   CRANIOTOMY Left 10/25/2019   Procedure: CRANIOTOMY HEMATOMA EVACUATION SUBDURAL;  Surgeon: Vallarie Mare, MD;  Location: [place];  Service: Neurosurgery;  Laterality: Left;   ESOPHAGOGASTRODUODENOSCOPY     around 2018 with [facility]   FOOT CAPSULOTOMY Left 07/25/2008   Mid Foot #2 MPJ   Hammertoe Repair Left 07/25/2008   #2 toe   SPINAL FUSION  01/2021   TARSAL TUNNEL RELEASE Left 07/25/2008   UPPER GASTROINTESTINAL ENDOSCOPY  09/02/2020   Social History:  reports that he quit smoking about 50 years ago. His smoking use included cigarettes. He has never used smokeless tobacco. He reports current alcohol use. He reports that he does not use drugs.  Allergies  Allergen Reactions   Fluzone Quadrivalent [Influenza Vac Split Quad] Other (See Comments)    "Got the flu"   Influenza Vaccines Other (See Comments)    "Got the flu"   Aspirin Other (See Comments)    Gastroesophageal reflux and "  allergic," per [facility]    Atorvastatin Other (See Comments)    Myalgias, GI upset, and "allergic," per [facility]   Canagliflozin Other (See Comments)    Pt reports that the use of Invokana caused acute KIDNEY FAILURE ([facility] records) and "allergic," per [facility]   Statins Other (See Comments)    Muscle pain- "allergic," per [facility]    Family History  Problem Relation Age of Onset   Hyperlipidemia Mother    Colon polyps Mother    Hyperlipidemia Father    Alcoholism  Brother    Healthy Son    Healthy Son    Cancer Neg Hx    Colon cancer Neg Hx    Esophageal cancer Neg Hx    Rectal cancer Neg Hx    Stomach cancer Neg Hx     Prior to Admission medications   Medication Sig Start Date End Date Taking? Authorizing Provider  acetaminophen (TYLENOL) 325 MG tablet Take 650 mg by mouth every 6 (six) hours as needed (for pain).    [provider]  amLODipine (NORVASC) 5 MG tablet Take 1 tablet (5 mg total) by mouth daily. 10/26/21   [name], DO  B Complex Vitamins (B COMPLEX PO) Take 1 tablet by mouth daily.    [provider]  Cholecalciferol (VITAMIN D3 SUPER STRENGTH) 50 MCG (2000 UT) CAPS Take 2,000 Units by mouth daily.    [provider]  Etanercept (ENBREL MINI) 50 MG/ML SOCT Inject 50 mg into the skin once a week. 09/17/21   [name], PA-C  ezetimibe (ZETIA) 10 MG tablet Take 1 tablet (10 mg total) by mouth daily. For elevated cholesterol and triglycerides 11/25/21   Wendling, Crosby Oyster, DO  fenofibrate micronized (LOFIBRA) 200 MG capsule TAKE 1 CAPSULE DAILY BEFOREBREAKFAST 07/06/21   [name], DO  gabapentin (NEURONTIN) 100 MG capsule TAKE 2 CAPSULES AT BEDTIME 07/13/21   Wendling, Crosby Oyster, DO  hydrochlorothiazide (HYDRODIURIL) 25 MG tablet Take 1 tablet (25 mg total) by mouth daily. 11/27/21   [name], DO  HYDROcodone-acetaminophen (NORCO/VICODIN) 5-325 MG tablet Take 1 tablet by mouth at bedtime as needed for moderate pain.    [provider]  insulin aspart (NOVOLOG FLEXPEN) 100 UNIT/ML FlexPen Inject 0-10 Units into the skin 3 (three) times daily with meals. Inject 0-10 units into the skin before meals, PER SLIDING SCALE: BGL 0-200 = give nothing, 201-250 = 2 units, 251-300 = 4 units, 301-350 = 6 units, 351-400 = 8 units, >400 = 10 units, push fluids, and repeat BGL check in 2 hours. If BGL remains >400 after 2 hours, CALL MD.    [provider]  insulin  degludec (TRESIBA) 100 UNIT/ML FlexTouch Pen Inject 30 Units into the skin at bedtime. 03/10/21   Donne Hazel, MD  leflunomide (ARAVA) 10 MG tablet TAKE 1 TABLET BY MOUTH EVERY DAY 08/14/21   Bo Merino, MD  lisinopril (ZESTRIL) 40 MG tablet Take 40 mg by mouth daily.    [provider]  Magnesium 200 MG TABS Take 200 mg by mouth daily in the afternoon.    [provider]  melatonin 5 MG TABS Take 5 mg by mouth at bedtime as needed.    [provider]  Multiple Vitamins-Minerals (MULTIVITAMIN WITH MINERALS) tablet Take 1 tablet by mouth daily.    [provider]  Omega 3 1000 MG CAPS Take 1,000 mg by mouth daily.    [provider]  omeprazole (PRILOSEC) 20 MG  capsule Take 1 capsule (20 mg total) by mouth daily before breakfast. 05/25/21   Wendling, Crosby Oyster, DO  [facility] VERIO test strip 4 (four) times daily. 06/14/20   [provider]  Potassium 99 MG TABS Take 99 mg by mouth daily.    [provider]  vitamin B-12 (CYANOCOBALAMIN) 1000 MCG tablet Take 1,000 mcg by mouth daily.    [provider]    Physical Exam: Vitals:   12/09/21 1029 12/09/21 1033 12/09/21 1048  BP:   (!) 141/77  Pulse:   69  Resp:   16  TempSrc:   Oral  SpO2: 100%  99%  Weight:  101.4 kg   Height:  5\' 11"  (1.803 m)     Constitutional: NAD, calm, comfortable Eyes: PERRL, lids and conjunctivae normal ENMT: Mucous membranes are moist. Posterior pharynx clear of any exudate or lesions. Neck: normal, supple, no masses, no thyromegaly Respiratory: clear to auscultation bilaterally, no wheezing, no crackles. Normal respiratory effort. No accessory muscle use.  Cardiovascular: Regular rate and rhythm, positive systolic ejection murmur. +1 pitting edema. 2+ pedal pulses.  Abdomen: no tenderness, no masses palpated. No hepatosplenomegaly. Bowel sounds positive.  Musculoskeletal: no clubbing / cyanosis. No joint deformity upper and lower  extremities. Good ROM, no contractures. Normal muscle tone.  Skin: no rashes, lesions, ulcers. No induration Neurologic: CN 2-12 grossly intact. Strength 5/5 in all 4.  Psychiatric: Normal judgment and insight. Alert and oriented x 3. Normal mood.    Data Reviewed:   EKG noted normal sinus rhythm at 66 bpm with inverted T waves and leads III, V3, and V4  Assessment and Plan: * Near syncope- (present on admission) Patient presents with complaints of dizziness and states that he felt like he could have passed out after standing this morning.  Family question if symptoms could be secondary to patient's aortic stenosis for which EMS was called.  CT imaging of the brain did not note any acute abnormality. -Check orthostatic vital signs -Follow-up telemetry overnight -PT to eval in a.m. -Appreciate cardiology consultative services   Aortic stenosis Patient noted to have severe aortic stenosis by last echocardiogram.  Currently being followed by cardiology for possible need of TAVR. -Cardiology consulted and will continue to engage patient in regards to further work-up  Rheumatoid arthritis ([place])- (present on admission) Patient has rheumatoid arthritis involving multiple joints with positive rheumatoid factor.  Patient is on Arava milligrams daily and Enbrel injections q. weekly  Stage 3 chronic kidney disease ([place])- (present on admission) Stable.  Creatinine 1.5 on admission which appears near patient's baseline. -Continue to monitor  Hypertriglyceridemia- (present on admission) -Continue Zetia and fenofibrate  Type 2 diabetes mellitus with diabetic neuropathy, unspecified ([place])- (present on admission) On admission blood sugar was noted to be 143.  Home insulin regimen includes Tresiba 30 units nightly and sliding scale of insulin and 0 to 10 units with meals. -Hypoglycemic protocols -CBGs before every meal with sensitive sliding scale of insulin -Continue gabapentin -Resume Tresiba  30 units tomorrow evening as patient has been n.p.o. throughout the day  Essential hypertension- (present on admission) Blood pressures are currently maintained.  Home medication regimen includes amlodipine 5 mg nightly, lisinopril 40 mg q. Morning.  He reports that he was taken off of carvedilol last month due to it possibly causing dry mouth symptoms. -Continue current blood pressure regimen   Advance Care Planning:   Code Status: Full Code   Consults: Cardiology   Family Communication: Wife updated at bedside  Severity of Illness: The appropriate patient status for this patient is OBSERVATION. Observation status is judged to be reasonable and necessary in order to provide the required intensity of service to ensure the patient's safety. The patient's presenting symptoms, physical exam findings, and initial radiographic and laboratory data in the context of their medical condition is felt to place them at decreased risk for further clinical deterioration. Furthermore, it is anticipated that the patient will be medically stable for discharge from the hospital within 2 midnights of admission.   Author: Norval Morton, MD 12/09/2021 1:59 PM  For on call review [email or web address].

## 2021-12-09 NOTE — ED Notes (Signed)
PA at bedside.

## 2021-12-09 NOTE — Consult Note (Addendum)
Cardiology Consultation:   Patient ID: Tommy Green MRN: [id number]; DOB: 1946/01/31  Admit date: 12/09/2021 Date of Consult: 12/09/2021  PCP:  [name], DO   [facility] Providers Cardiologist:  Shirlee More, MD       Patient Profile:   Tommy Green is a 76 y.o. male with a hx of diabetes, hypertension, severe aortic stenosis, hyperlipidemia, prior stroke, skin cancer, iron deficiency anemia, obesity who is being seen 12/09/2021 for the evaluation of severe aortic stenosis, presyncope at the request of Dr. Tamala Julian.   History of Present Illness:   [name] is a 79 old male with above medical history who is followed by Dr. [name].  Per chart review, patient underwent spinal fusion surgery in April 2022.  After the surgery, patient became hypotensive requiring ICU level care, Levophed, blood transfusions, IV fluids.  Echocardiogram that admission showed a calcified aortic valve with moderate to severe aortic stenosis, mild LVH, LVEF 55 to 60%.  Patient was referred to Dr. [name] for evaluation of aortic stenosis.  At that visit, patient reported that he had a lifetime heart murmur ever since he had rheumatic fever as a child. He has never had evaluations.  Denied any chest pain, edema, shortness of breath, palpitation, syncope.  On discussion, patient and wife were both very hesitant to consider cardiac interventions.  Repeat echocardiogram on 10/21/2021 showed LVEF 50 to 73%, grade 1 diastolic dysfunction, severe aortic valve stenosis, severe calcification of the aortic valve.  Patient presented to the ED on 2/22 complaining of dizziness.  Reported that felt different than his history of vertigo.  Complained of frontal headache and sinus congestion.  Labs in the ED showed Na 135, K 4.2, creatinine 1.50, hemoglobin 9.7, WBC 8.0. COVID/flu negative.  EKG showed a normal sinus rhythm with HR 66, inverted t-waves in leads III, V3, V4.    On interview, patient reports that  when he woke up this morning he felt like "his head was swimming" and like he was unbalanced when he stood up out of bed.  Patient has issues with his sinuses, and woke up this morning with a sinus headache.  Dizziness improved with Dramamine. Denies any chest pain, palpitations, shortness of breath, syncope.  Reports that he had rheumatic fever as a child, has had a heart murmur ever since.  Was seen about a month ago by his cardiologist for evaluation of severe aortic stenosis.  Patient is hesitant to undergo any interventions.  Denies any other cardiac history. He follows regularly with a dentist.   Past Medical History:  Diagnosis Date   Anemia    Essential hypertension 07/18/2017   Foot drop, left foot    due to back surgery in 01/2021   History of colonic polyps    History of rheumatic fever    History of stroke 07/18/2017   Hypertriglyceridemia 07/18/2017   Type 2 diabetes mellitus with diabetic neuropathy, unspecified ([place]) 07/18/2017    Past Surgical History:  Procedure Laterality Date   COLONOSCOPY     around 2018 [facility]   COLONOSCOPY  09/02/2020   CRANIOTOMY Left 10/25/2019   Procedure: CRANIOTOMY HEMATOMA EVACUATION SUBDURAL;  Surgeon: Vallarie Mare, MD;  Location: [place];  Service: Neurosurgery;  Laterality: Left;   ESOPHAGOGASTRODUODENOSCOPY     around 2018 with [facility]   FOOT CAPSULOTOMY Left 07/25/2008   Mid Foot #2 MPJ   Hammertoe Repair Left 07/25/2008   #2 toe   SPINAL FUSION  01/2021  TARSAL TUNNEL RELEASE Left 07/25/2008   UPPER GASTROINTESTINAL ENDOSCOPY  09/02/2020     Home Medications:  Prior to Admission medications   Medication Sig Start Date End Date Taking? Authorizing Provider  acetaminophen (TYLENOL) 325 MG tablet Take 650 mg by mouth every 6 (six) hours as needed for mild pain or headache.   Yes [provider]  amLODipine (NORVASC) 5 MG tablet Take 1 tablet (5 mg total) by mouth daily. Patient taking differently: Take 5  mg by mouth at bedtime. 10/26/21  Yes [name], DO  aspirin EC 81 MG tablet Take 162 mg by mouth every 6 (six) hours as needed for mild pain (or headaches- Swallow whole).   Yes [provider]  B Complex Vitamins (B COMPLEX PO) Take 1 tablet by mouth daily.   Yes [provider]  Cholecalciferol (VITAMIN D3 SUPER STRENGTH) 50 MCG (2000 UT) CAPS Take 2,000 Units by mouth daily.   Yes [provider]  Etanercept (ENBREL MINI) 50 MG/ML SOCT Inject 50 mg into the skin once a week. Patient taking differently: Inject 50 mg into the skin every Monday. 09/17/21  Yes [name], PA-C  ezetimibe (ZETIA) 10 MG tablet Take 1 tablet (10 mg total) by mouth daily. For elevated cholesterol and triglycerides Patient taking differently: Take 10 mg by mouth at bedtime. For elevated cholesterol and triglycerides 11/25/21  Yes [name], DO  fenofibrate micronized (LOFIBRA) 200 MG capsule TAKE 1 CAPSULE DAILY BEFOREBREAKFAST Patient taking differently: Take 200 mg by mouth daily before breakfast. 07/06/21  Yes Wendling, Crosby Oyster, DO  gabapentin (NEURONTIN) 100 MG capsule TAKE 2 CAPSULES AT BEDTIME Patient taking differently: Take 200 mg by mouth at bedtime. 07/13/21  Yes [name], DO  HYDROcodone-acetaminophen (NORCO) 10-325 MG tablet Take 0.5 tablets by mouth See admin instructions. Take 0.5 tablet by mouth at bedtime and an additional 0.5 tablet once a day as needed for pain   Yes [provider]  ibuprofen (ADVIL) 200 MG tablet Take 600 mg by mouth every 6 (six) hours as needed for mild pain or headache.   Yes [provider]  insulin aspart (NOVOLOG FLEXPEN) 100 UNIT/ML FlexPen Inject 0-10 Units into the skin See admin instructions. Inject 0-10 units into the skin before meals, PER SLIDING SCALE: BGL 0-200 = give nothing, 201-250 = 2 units, 251-300 = 4 units, 301-350 = 6 units, 351-400 = 8 units, >400 = 10 units, push fluids,  and repeat BGL check in 2 hours. If BGL remains >400 after 2 hours, CALL MD.   Yes [provider]  insulin degludec (TRESIBA) 100 UNIT/ML FlexTouch Pen Inject 30 Units into the skin at bedtime. 03/10/21  Yes Donne Hazel, MD  leflunomide (ARAVA) 10 MG tablet TAKE 1 TABLET BY MOUTH EVERY DAY Patient taking differently: Take 10 mg by mouth daily with breakfast. 08/14/21  Yes Deveshwar, Abel Presto, MD  lisinopril (ZESTRIL) 40 MG tablet Take 40 mg by mouth in the morning.   Yes [provider]  magnesium oxide (MAG-OX) 400 MG tablet Take 400 mg by mouth daily with lunch.   Yes [provider]  melatonin 5 MG TABS Take 5 mg by mouth at bedtime.   Yes [provider]  Multiple Vitamins-Minerals (MULTIVITAMIN WITH MINERALS) tablet Take 1 tablet by mouth daily with breakfast.   Yes [provider]  Omega 3 1000 MG CAPS Take 1,000 mg by mouth daily with lunch.   Yes [provider]  omeprazole (PRILOSEC) 20 MG capsule Take 1 capsule (20 mg total) by mouth daily before breakfast. 05/25/21  Yes Wendling, Crosby Oyster, DO  Potassium 99 MG TABS Take 99 mg by mouth every evening.   Yes [provider]  vitamin B-12 (CYANOCOBALAMIN) 1000 MCG tablet Take 1,000 mcg by mouth daily.   Yes [provider]  vitamin E 180 MG (400 UNITS) capsule Take 400 Units by mouth daily.   Yes [provider]  hydrochlorothiazide (HYDRODIURIL) 25 MG tablet Take 1 tablet (25 mg total) by mouth daily. Patient not taking: Reported on 12/09/2021 11/27/21   [name], DO  [facility] VERIO test strip 4 (four) times daily. 06/14/20   [provider]   Inpatient Medications: Scheduled Meds:  Continuous Infusions:  PRN Meds:  Allergies:    Allergies  Allergen Reactions   Fluzone Quadrivalent [Influenza Vac Split Quad] Other (See Comments)    "Got the flu"   Influenza Vaccines Other (See Comments)    "Got the flu"   Aspirin Other (See  Comments)    Gastroesophageal reflux and "allergic," per [facility]    Atorvastatin Other (See Comments)    Myalgias, GI upset, and "allergic," per [facility]   Canagliflozin Other (See Comments)    Pt reports that the use of Invokana caused acute KIDNEY FAILURE ([facility] records) and "allergic," per [facility]   Statins Other (See Comments)    Muscle pain- "allergic," per [facility]   Social History:   Social History   Socioeconomic History   Marital status: Married    Spouse name: Not on file   Number of children: Not on file   Years of education: Not on file   Highest education level: Not on file  Occupational History   Not on file  Tobacco Use   Smoking status: Former    Years: 10.00    Types: Cigarettes    Quit date: 1973    Years since quitting: 50.1   Smokeless tobacco: Never  Vaping Use   Vaping Use: Never used  Substance and Sexual Activity   Alcohol use: Yes    Comment: occ beer   Drug use: No   Sexual activity: Yes  Other Topics Concern   Not on file  Social History Narrative   Not on file   Social Determinants of Health   Financial Resource Strain: Low Risk    Difficulty of Paying Living Expenses: Not very hard  Food Insecurity: Not on file  Transportation Needs: Not on file  Physical Activity: Insufficiently Active   Days of Exercise per Week: 2 days   Minutes of Exercise per Session: 30 min  Stress: Not on file  Social Connections: Not on file  Intimate Partner Violence: Not on file    Family History:    Family History  Problem Relation Age of Onset   Hyperlipidemia Mother    Colon polyps Mother    Hyperlipidemia Father    Alcoholism Brother    Healthy Son    Healthy Son    Cancer Neg Hx    Colon cancer Neg Hx    Esophageal cancer Neg Hx    Rectal cancer Neg Hx    Stomach cancer Neg Hx     ROS:  Please see the history of present illness.   All other ROS reviewed and negative.     Physical Exam/Data:   Vitals:   12/09/21 1029 12/09/21 1033 12/09/21 1048  BP:    (!) 141/77  Pulse:   69  Resp:   16  TempSrc:   Oral  SpO2: 100%  99%  Weight:  101.4 kg   Height:  5\' 11"  (1.803 m)    No intake or output data in the 24 hours ending 12/09/21 1512 Last 3 Weights 12/09/2021 11/27/2021 11/17/2021  Weight (lbs) 223 lb 8.7 oz 223 lb 8 oz 224 lb  Weight (kg) 101.4 kg 101.379 kg 101.606 kg     Body mass index is 31.18 kg/m.  General:  Well nourished, well developed, in no acute distress HEENT: normal Neck: no JVD Vascular: Radial pulses 2+ bilaterally Cardiac:  normal S1, S2; RRR; grace 3/6 systolic murmur heard throughout pericardium  Lungs:  clear to auscultation bilaterally, no wheezing, rhonchi or rales  Abd: soft, nontender, no hepatomegaly  Ext: trace edema in BLE, worse in left. Denies lower leg tenderness. No warmth or redness noted in BLE.  Musculoskeletal:  No deformities Skin: warm and dry  Neuro:  CNs 2-12 intact, no focal abnormalities noted Psych:  Normal affect   EKG:  The EKG was personally reviewed and demonstrates:  normal sinus rhythm with HR 66, inverted t-waves in leads III, V3, V4  Telemetry:  Telemetry was personally reviewed and demonstrates:  NA   Relevant CV Studies:  Echocardiogram on 10/21/2021  1. Vertical orientation on PS views. Left ventricular ejection fraction,  by estimation, is 50 to 55%. The left ventricle has low normal function.  The left ventricle has no regional wall motion abnormalities. There is  mild concentric left ventricular  hypertrophy. Left ventricular diastolic parameters are consistent with  Grade I diastolic dysfunction (impaired relaxation).   2. Right ventricular systolic function is normal. The right ventricular  size is normal.   3. Left atrial size was moderately dilated.   4. The mitral valve is normal in structure. Trivial mitral valve  regurgitation. No evidence of mitral stenosis.   5. Normal SVI/Flow      . The aortic valve is tricuspid. There is severe calcifcation of the  aortic  valve. There is moderate thickening of the aortic valve. Aortic  valve regurgitation is not visualized. Severe aortic valve stenosis.   6. There is mild dilatation of the ascending aorta, measuring 37 mm.   7. The inferior vena cava is normal in size with greater than 50%  respiratory variability, suggesting right atrial pressure of 3 mmHg.   Comparison(s): LVEF 55-60%, Severe AS.   STS Risk Calculation Isolated AVR:  Risk of Mortality: 2.075% Renal Failure: 4.876% Permanent Stroke: 2.457% Prolonged Ventilation: 8.585% DSW Infection: 0.175% Reoperation: 3.913% Morbidity or Mortality: 15.133% Short Length of Stay: 27.613% Long Length of Stay: 7.805%  [place]  KCCQ-12 12/10/2021  1 a. Ability to shower/bathe Slightly limited  1 b. Ability to walk 1 block Other, Did not do  1 c. Ability to hurry/jog Other, Did not do  2. Edema feet/ankles/legs Never over the past 2 weeks  3. Limited by fatigue Less than once a week  4. Limited by dyspnea Less than once a week  5. Sitting up / on 3+ pillows Never over the past 2 weeks  6. Limited enjoyment of life Slightly limited  7. Rest of life w/ symptoms Mostly satisfied  8 a. Participation in hobbies Moderately limited  8 b. Participation in chores Moderately limited  8 c. Visiting family/friends Slightly limited    Laboratory Data:  High Sensitivity Troponin:  No results for input(s): TROPONINIHS in the last 720 hours.   Chemistry Recent  Labs  Lab 12/09/21 1058  NA 135  K 4.2  CL 103  CO2 22  GLUCOSE 143*  BUN 33*  CREATININE 1.50*  CALCIUM 8.7*  GFRNONAA 48*  ANIONGAP 10    No results for input(s): PROT, ALBUMIN, AST, ALT, ALKPHOS, BILITOT in the last 168 hours. Lipids No results for input(s): CHOL, TRIG, HDL, LABVLDL, LDLCALC, CHOLHDL in the last 168 hours.  Hematology Recent Labs  Lab 12/09/21 1058  WBC 8.0  RBC 3.79*  HGB 9.7*  HCT 30.8*  MCV 81.3  MCH 25.6*  MCHC 31.5   RDW 16.4*  PLT 276   Thyroid No results for input(s): TSH, FREET4 in the last 168 hours.  BNPNo results for input(s): BNP, PROBNP in the last 168 hours.  DDimer No results for input(s): DDIMER in the last 168 hours.   Radiology/Studies:  CT Head Wo Contrast  Result Date: 12/09/2021 CLINICAL DATA:  Frontal headache and dizziness EXAM: CT HEAD WITHOUT CONTRAST TECHNIQUE: Contiguous axial images were obtained from the base of the skull through the vertex without intravenous contrast. RADIATION DOSE REDUCTION: This exam was performed according to the departmental dose-optimization program which includes automated exposure control, adjustment of the mA and/or kV according to patient size and/or use of iterative reconstruction technique. COMPARISON:  CT head 03/05/2021 FINDINGS: Brain: Generalized atrophy. Chronic infarct left frontal lobe unchanged. Negative for acute infarct, hemorrhage, mass Vascular: Negative for hyperdense vessel Skull: Left parietal craniotomy. Craniotomy flap in good position. No acute skeletal abnormality Sinuses/Orbits: Mild mucosal edema paranasal sinuses. Air-fluid level right sphenoid sinus. Negative orbit Other: None IMPRESSION: Atrophy and chronic ischemic change.  No acute abnormality. Electronically Signed   By: Franchot Gallo M.D.   On: 12/09/2021 11:52     Assessment and Plan:   Dizziness - Patient reports dizziness, frontal HA, sinus congestion. Dizziness occurred when patient first stood up out of bed.  Denies syncope. Improved with dramamine  - Patient reports that the dizziness is similar to sinus headaches he has had in the past - Head CT negative for acute infarct, mild mucosal edema paranasal sinuses, Air-fluid level in right sphenoid sinus consistent with patient's complaints of chronic sinus issues.   Severe AS - Echocardiogram on 10/21/21 showed a severely calcified aortic valve with severe aortic stenosis  - Patient recently seen in outpatient setting  for evaluation of severe AS, asymptomatic at that time  - Patient has expressed in the past that he is extremely hesitant to consider interventions. Discussed with patient and he would prefer to avoid invasive procedures/surgery as patient has had serious complications after surgeries in the past. Can continue to assess and discuss TAVR as an outpatient , Dr. Ali Lowe to see patient while admitted and he will give formal recommendations.  - Given that the patient has complaints of sinus headache, and denies any chest pain, SOB, palpitations, it is very likely that dizziness is not related to AS but rather ongoing sinus issues.  - Patient had an echocardiogram less than 2 months ago, probably does not need a repeat at this time    Risk Assessment/Risk Scores:          For questions or updates, please contact [place] Please consult [email or web address] for contact info under    Signed, Margie Billet, PA-C  12/09/2021 3:12 PM   ATTENDING ATTESTATION:  After conducting a review of all available clinical information with the care team, interviewing the patient, and performing a physical exam, I agree with the findings  and plan described in this note.   GEN: No acute distress.   Cardiac: RRR, 3/6 SEM, rubs, or gallops.  Respiratory: Clear to auscultation bilaterally. GI: Soft, nontender, non-distended  MS: No edema; No deformity. Neuro:  Nonfocal  Vasc:  +2 radial pulses  The patient is a 76 year old male with a history of hypertension, type 2 diabetes, hyperlipidemia, iron deficiency anemia, and now severe aortic stenosis.  He had an episode of presyncope today.  He has had no antecedent chest pain or exertional dyspnea.  In fact he was seen by his primary cardiologist Dr. [name] recently and was relatively asymptomatic.  I had a long conversation with the patient regarding this worrisome events.  I think she needs to consider an aortic valve intervention and given his advanced age  and other comorbidities transcatheter valve replacement would probably be the best option.  He and his wife will think about this.  I will reengage them tomorrow.  I did describe to them the evaluation including a cardiac catheterization, CT scan, and consultation with cardiothoracic surgery.  Lenna Sciara, MD Pager [phone]

## 2021-12-09 NOTE — ED Notes (Signed)
ED TO INPATIENT HANDOFF REPORT  ED Nurse Name and Phone #: Bonna Gains [phone]  S Name/Age/Gender Tommy Green 76 y.o. male Room/Bed: H018C/H018C  Code Status   Code Status: Prior  Home/SNF/Other Home Patient oriented to: self, place, time, and situation Is this baseline? Yes   Triage Complete: Triage complete  Chief Complaint Near syncope [R55]  Triage Note Went to bed at 8pm this morning woke up with dizziness and feels this is different then his vertigo.also c/o of frontal HA and sinus congestion. Alert and oriented no neuro deficits or vision changes per EMS Hx of brain bleeding in the past with hx of aortic value issues    Allergies Allergies  Allergen Reactions   Fluzone Quadrivalent [Influenza Vac Split Quad] Other (See Comments)    "Got the flu"   Influenza Vaccines Other (See Comments)    "Got the flu"   Aspirin Other (See Comments)    Gastroesophageal reflux and "allergic," per MAR    Atorvastatin Other (See Comments)    Myalgias, GI upset, and "allergic," per MAR   Canagliflozin Other (See Comments)    Pt reports that the use of Invokana caused acute KIDNEY FAILURE ([facility] records) and "allergic," per [facility]   Statins Other (See Comments)    Muscle pain- "allergic," per MAR    Level of Care/Admitting Diagnosis ED Disposition     ED Disposition  Admit   Condition  --   [place]: [place] [100100]  Level of Care: Telemetry Cardiac [103]  May place patient in observation at [facility] or [place] [facility] if equivalent level of care is available:: No  Covid Evaluation: Asymptomatic Screening Protocol (No Symptoms)  Diagnosis: Near syncope [568127]  Admitting Physician: Norval Morton [[id number]]  Attending Physician: Norval Morton [[id number]]          B Medical/Surgery History Past Medical History:  Diagnosis Date   Anemia    Essential hypertension 07/18/2017   Foot drop, left foot    due to back surgery in  01/2021   History of colonic polyps    History of rheumatic fever    History of stroke 07/18/2017   Hypertriglyceridemia 07/18/2017   Type 2 diabetes mellitus with diabetic neuropathy, unspecified ([place]) 07/18/2017   Past Surgical History:  Procedure Laterality Date   COLONOSCOPY     around 2018 [facility]   COLONOSCOPY  09/02/2020   CRANIOTOMY Left 10/25/2019   Procedure: CRANIOTOMY HEMATOMA EVACUATION SUBDURAL;  Surgeon: Vallarie Mare, MD;  Location: [facility];  Service: Neurosurgery;  Laterality: Left;   ESOPHAGOGASTRODUODENOSCOPY     around 2018 with [facility]   FOOT CAPSULOTOMY Left 07/25/2008   Mid Foot #2 MPJ   Hammertoe Repair Left 07/25/2008   #2 toe   SPINAL FUSION  01/2021   TARSAL TUNNEL RELEASE Left 07/25/2008   UPPER GASTROINTESTINAL ENDOSCOPY  09/02/2020     A IV Location/Drains/Wounds Patient Lines/Drains/Airways Status     Active Line/Drains/Airways     Name Placement date Placement time Site Days   Peripheral IV 12/09/21 18 G Antecubital 12/09/21  1030  Antecubital  less than 1   External Urinary Catheter 03/09/21  --  --  275   Incision (Closed) 10/25/19 Head 10/25/19  1758  -- 776   Incision (Closed) 03/07/21 Vertebral column Mid 03/07/21  2000  -- 277            Intake/Output Last 24 hours No intake or output  data in the 24 hours ending 12/09/21 1509  Labs/Imaging Results for orders placed or performed during the hospital encounter of 12/09/21 (from the past 48 hour(s))  Basic metabolic panel     Status: Abnormal   Collection Time: 12/09/21 10:58 AM  Result Value Ref Range   Sodium 135 135 - 145 mmol/L   Potassium 4.2 3.5 - 5.1 mmol/L   Chloride 103 98 - 111 mmol/L   CO2 22 22 - 32 mmol/L   Glucose, Bld 143 (H) 70 - 99 mg/dL    Comment: Glucose reference range applies only to samples taken after fasting for at least 8 hours.   BUN 33 (H) 8 - 23 mg/dL   Creatinine, Ser 1.50 (H) 0.61 - 1.24 mg/dL   Calcium 8.7 (L) 8.9 - 10.3 mg/dL    GFR, Estimated 48 (L) >60 mL/min    Comment: (NOTE) Calculated using the CKD-EPI Creatinine Equation (2021)    Anion gap 10 5 - 15    Comment: Performed at [facility] [address], [place], [place] [address]  CBC     Status: Abnormal   Collection Time: 12/09/21 10:58 AM  Result Value Ref Range   WBC 8.0 4.0 - 10.5 K/uL   RBC 3.79 (L) 4.22 - 5.81 MIL/uL   Hemoglobin 9.7 (L) 13.0 - 17.0 g/dL   HCT 30.8 (L) 39.0 - 52.0 %   MCV 81.3 80.0 - 100.0 fL   MCH 25.6 (L) 26.0 - 34.0 pg   MCHC 31.5 30.0 - 36.0 g/dL   RDW 16.4 (H) 11.5 - 15.5 %   Platelets 276 150 - 400 K/uL   nRBC 0.0 0.0 - 0.2 %    Comment: Performed at [facility], [place] [address] [place], [place] [address]  Resp Panel by RT-PCR (Flu A&B, Covid) Nasopharyngeal Swab     Status: None   Collection Time: 12/09/21 11:10 AM   Specimen: Nasopharyngeal Swab; Nasopharyngeal(NP) swabs in vial transport medium  Result Value Ref Range   SARS Coronavirus 2 by RT PCR NEGATIVE NEGATIVE    Comment: (NOTE) SARS-CoV-2 target nucleic acids are NOT DETECTED.  The SARS-CoV-2 RNA is generally detectable in upper respiratory specimens during the acute phase of infection. The lowest concentration of SARS-CoV-2 viral copies this assay can detect is 138 copies/mL. A negative result does not preclude SARS-Cov-2 infection and should not be used as the sole basis for treatment or other patient management decisions. A negative result may occur with  improper specimen collection/handling, submission of specimen other than nasopharyngeal swab, presence of viral mutation(s) within the areas targeted by this assay, and inadequate number of viral copies(<138 copies/mL). A negative result must be combined with clinical observations, patient history, and epidemiological information. The expected result is Negative.  Fact Sheet for Patients:  EntrepreneurPulse.com.au  Fact Sheet for Healthcare Providers:   IncredibleEmployment.be  This test is no t yet approved or cleared by the [place] FDA and  has been authorized for detection and/or diagnosis of SARS-CoV-2 by FDA under an Emergency Use Authorization (EUA). This EUA will remain  in effect (meaning this test can be used) for the duration of the COVID-19 declaration under Section 564(b)(1) of the Act, 21 U.S.C.section 360bbb-3(b)(1), unless the authorization is terminated  or revoked sooner.       Influenza A by PCR NEGATIVE NEGATIVE   Influenza B by PCR NEGATIVE NEGATIVE    Comment: (NOTE) The Xpert Xpress SARS-CoV-2/FLU/RSV plus assay is intended as an  aid in the diagnosis of influenza from Nasopharyngeal swab specimens and should not be used as a sole basis for treatment. Nasal washings and aspirates are unacceptable for Xpert Xpress SARS-CoV-2/FLU/RSV testing.  Fact Sheet for Patients: EntrepreneurPulse.com.au  Fact Sheet for Healthcare Providers: IncredibleEmployment.be  This test is not yet approved or cleared by the [place] FDA and has been authorized for detection and/or diagnosis of SARS-CoV-2 by FDA under an Emergency Use Authorization (EUA). This EUA will remain in effect (meaning this test can be used) for the duration of the COVID-19 declaration under Section 564(b)(1) of the Act, 21 U.S.C. section 360bbb-3(b)(1), unless the authorization is terminated or revoked.  Performed at [facility], [place] [address] [place], [place] [address]   Urinalysis, Routine w reflex microscopic     Status: None   Collection Time: 12/09/21 11:36 AM  Result Value Ref Range   Color, Urine YELLOW YELLOW   APPearance CLEAR CLEAR   Specific Gravity, Urine 1.005 1.005 - 1.030   pH 6.0 5.0 - 8.0   Glucose, UA NEGATIVE NEGATIVE mg/dL   Hgb urine dipstick NEGATIVE NEGATIVE   Bilirubin Urine NEGATIVE NEGATIVE   Ketones, ur NEGATIVE NEGATIVE mg/dL   Protein, ur  NEGATIVE NEGATIVE mg/dL   Nitrite NEGATIVE NEGATIVE   Leukocytes,Ua NEGATIVE NEGATIVE    Comment: Performed at [facility] [address], [place], [place] [address]  CBG monitoring, ED     Status: Abnormal   Collection Time: 12/09/21  2:46 PM  Result Value Ref Range   Glucose-Capillary 124 (H) 70 - 99 mg/dL    Comment: Glucose reference range applies only to samples taken after fasting for at least 8 hours.   CT Head Wo Contrast  Result Date: 12/09/2021 CLINICAL DATA:  Frontal headache and dizziness EXAM: CT HEAD WITHOUT CONTRAST TECHNIQUE: Contiguous axial images were obtained from the base of the skull through the vertex without intravenous contrast. RADIATION DOSE REDUCTION: This exam was performed according to the departmental dose-optimization program which includes automated exposure control, adjustment of the mA and/or kV according to patient size and/or use of iterative reconstruction technique. COMPARISON:  CT head 03/05/2021 FINDINGS: Brain: Generalized atrophy. Chronic infarct left frontal lobe unchanged. Negative for acute infarct, hemorrhage, mass Vascular: Negative for hyperdense vessel Skull: Left parietal craniotomy. Craniotomy flap in good position. No acute skeletal abnormality Sinuses/Orbits: Mild mucosal edema paranasal sinuses. Air-fluid level right sphenoid sinus. Negative orbit Other: None IMPRESSION: Atrophy and chronic ischemic change.  No acute abnormality. Electronically Signed   By: Franchot Gallo M.D.   On: 12/09/2021 11:52    Pending Labs Unresulted Labs (From admission, onward)    None       Vitals/Pain Today's Vitals   12/09/21 1029 12/09/21 1033 12/09/21 1048  BP:   (!) 141/77  Pulse:   69  Resp:   16  TempSrc:   Oral  SpO2: 100%  99%  Weight:  101.4 kg   Height:  5\' 11"  (1.803 m)   PainSc:  0-No pain     Isolation Precautions No active isolations  Medications Medications - No data to display  Mobility walks with device Low fall  risk   Focused Assessments Cardiac Assessment Handoff:    No results found for: CKTOTAL, CKMB, CKMBINDEX, TROPONINI No results found for: DDIMER Does the Patient currently have chest pain? No    R Recommendations: See Admitting Provider Note  Report given to:   Additional Notes:

## 2021-12-09 NOTE — ED Provider Notes (Incomplete)
[facility] EMERGENCY DEPARTMENT Provider Note   CSN: [id number] Arrival date & time: 12/09/21  1029     History {Add pertinent medical, surgical, social history, OB history to HPI:1} Chief Complaint  Patient presents with   Dizziness    Tommy Green is a 76 y.o. male.   Dizziness  76 year old male with significant history of diabetes, hypertension, hyperlipidemia, prior stroke, skin cancer, iron deficiency anemia, obesity who was brought here via EMS from home for evaluation of dizziness.  2 weeks of recurrent nosebleed, 1 day of headache and mild dizziness.  Dry nose, bleeding is intermittent.  Has congestion/fullness 6/10, dull tight rubber band feeling across head.  No aura or photophobia/phonophobia.  Took tylenol without help.  Has some dry cough.  No f/c/change in appetite, ear pain.  Feels more lightheadedeness than dizziness.    Has systolic murmur.    Suspect possible sinus infection. Covid/flu test.  CBC, EKG, BMP, headache cocktail (compazine, toradol, benadryl).    Home Medications Prior to Admission medications   Medication Sig Start Date End Date Taking? Authorizing Provider  acetaminophen (TYLENOL) 325 MG tablet Take 650 mg by mouth every 6 (six) hours as needed (for pain).    [provider]  amLODipine (NORVASC) 5 MG tablet Take 1 tablet (5 mg total) by mouth daily. 10/26/21   [name], DO  B Complex Vitamins (B COMPLEX PO) Take 1 tablet by mouth daily.    [provider]  Cholecalciferol (VITAMIN D3 SUPER STRENGTH) 50 MCG (2000 UT) CAPS Take 2,000 Units by mouth daily.    [provider]  Etanercept (ENBREL MINI) 50 MG/ML SOCT Inject 50 mg into the skin once a week. 09/17/21   [name], PA-C  ezetimibe (ZETIA) 10 MG tablet Take 1 tablet (10 mg total) by mouth daily. For elevated cholesterol and triglycerides 11/25/21   Wendling, Crosby Oyster, DO  fenofibrate micronized (LOFIBRA) 200 MG capsule  TAKE 1 CAPSULE DAILY BEFOREBREAKFAST 07/06/21   [name], DO  gabapentin (NEURONTIN) 100 MG capsule TAKE 2 CAPSULES AT BEDTIME 07/13/21   Wendling, Crosby Oyster, DO  hydrochlorothiazide (HYDRODIURIL) 25 MG tablet Take 1 tablet (25 mg total) by mouth daily. 11/27/21   [name], DO  HYDROcodone-acetaminophen (NORCO/VICODIN) 5-325 MG tablet Take 1 tablet by mouth at bedtime as needed for moderate pain.    [provider]  insulin aspart (NOVOLOG FLEXPEN) 100 UNIT/ML FlexPen Inject 0-10 Units into the skin 3 (three) times daily with meals. Inject 0-10 units into the skin before meals, PER SLIDING SCALE: BGL 0-200 = give nothing, 201-250 = 2 units, 251-300 = 4 units, 301-350 = 6 units, 351-400 = 8 units, >400 = 10 units, push fluids, and repeat BGL check in 2 hours. If BGL remains >400 after 2 hours, CALL MD.    [provider]  insulin degludec (TRESIBA) 100 UNIT/ML FlexTouch Pen Inject 30 Units into the skin at bedtime. 03/10/21   Donne Hazel, MD  leflunomide (ARAVA) 10 MG tablet TAKE 1 TABLET BY MOUTH EVERY DAY 08/14/21   Bo Merino, MD  lisinopril (ZESTRIL) 40 MG tablet Take 40 mg by mouth daily.    [provider]  Magnesium 200 MG TABS Take 200 mg by mouth daily in the afternoon.    [provider]  melatonin 5 MG TABS Take 5 mg by mouth at bedtime as needed.    [provider]  Multiple Vitamins-Minerals (MULTIVITAMIN WITH MINERALS) tablet Take  1 tablet by mouth daily.    [provider]  Omega 3 1000 MG CAPS Take 1,000 mg by mouth daily.    [provider]  omeprazole (PRILOSEC) 20 MG capsule Take 1 capsule (20 mg total) by mouth daily before breakfast. 05/25/21   Wendling, Crosby Oyster, DO  [facility] VERIO test strip 4 (four) times daily. 06/14/20   [provider]  Potassium 99 MG TABS Take 99 mg by mouth daily.    [provider]  vitamin B-12 (CYANOCOBALAMIN) 1000 MCG tablet Take  1,000 mcg by mouth daily.    [provider]      Allergies    Fluzone quadrivalent [influenza vac split quad], Influenza vaccines, Aspirin, Atorvastatin, Canagliflozin, and Statins    Review of Systems   Review of Systems  Neurological:  Positive for dizziness.   Physical Exam Updated Vital Signs BP (!) 141/77 (BP Location: Right Arm)    Pulse 69    Resp 16    Ht 5\' 11"  (1.803 m)    Wt 101.4 kg    SpO2 99%    BMI 31.18 kg/m  Physical Exam  ED Results / Procedures / Treatments   Labs (all labs ordered are listed, but only abnormal results are displayed) Labs Reviewed  BASIC METABOLIC PANEL  CBC  URINALYSIS, ROUTINE W REFLEX MICROSCOPIC  CBG MONITORING, ED    EKG None ED ECG REPORT   Date: 12/09/2021  Rate: 66  Rhythm: normal sinus rhythm  QRS Axis: normal  Intervals: normal  ST/T Wave abnormalities: nonspecific ST/T changes  Conduction Disutrbances:none  Narrative Interpretation:   Old EKG Reviewed: unchanged  I have personally reviewed the EKG tracing and agree with the computerized printout as noted.   Radiology No results found.  Procedures Procedures  {Document cardiac monitor, telemetry assessment procedure when appropriate:1}  Medications Ordered in ED Medications - No data to display  ED Course/ Medical Decision Making/ A&P                           Medical Decision Making Amount and/or Complexity of Data Reviewed Labs: ordered.   ***  {Document critical care time when appropriate:1} {Document review of labs and clinical decision tools ie heart score, Chads2Vasc2 etc:1}  {Document your independent review of radiology images, and any outside records:1} {Document your discussion with family members, caretakers, and with consultants:1} {Document social determinants of health affecting pt's care:1} {Document your decision making why or why not admission, treatments were needed:1} Final Clinical Impression(s) / ED Diagnoses Final  diagnoses:  None    Rx / DC Orders ED Discharge Orders     None

## 2021-12-09 NOTE — Assessment & Plan Note (Addendum)
-  Stable.  -Creatinine 1.5 on admission which appears near patient's baseline. -Repeat BUNs/creatinine is 30/1.44 -> 27/1.57 -> 25/1.61 and received IVF with NS post Cath -Avoid nephrotoxic medications, contrast dyes, hypotension and adjust medications -Repeat CMP in the AM

## 2021-12-09 NOTE — Assessment & Plan Note (Addendum)
-  Patient has Rheumatoid arthritis involving multiple joints with positive rheumatoid factor.  -Patient is on Arava milligrams daily and Enbrel injections q. Weekly which is currently being held

## 2021-12-09 NOTE — ED Triage Notes (Signed)
Went to bed at 8pm this morning woke up with dizziness and feels this is different then his vertigo.also c/o of frontal HA and sinus congestion. Alert and oriented no neuro deficits or vision changes per EMS Hx of brain bleeding in the past with hx of aortic value issues

## 2021-12-10 ENCOUNTER — Other Ambulatory Visit: Payer: Self-pay | Admitting: Physician Assistant

## 2021-12-10 ENCOUNTER — Encounter: Payer: Self-pay | Admitting: Physician Assistant

## 2021-12-10 ENCOUNTER — Ambulatory Visit: Payer: Medicare HMO | Admitting: Physical Therapy

## 2021-12-10 DIAGNOSIS — D509 Iron deficiency anemia, unspecified: Secondary | ICD-10-CM

## 2021-12-10 DIAGNOSIS — Z85828 Personal history of other malignant neoplasm of skin: Secondary | ICD-10-CM | POA: Diagnosis not present

## 2021-12-10 DIAGNOSIS — I350 Nonrheumatic aortic (valve) stenosis: Secondary | ICD-10-CM

## 2021-12-10 DIAGNOSIS — E669 Obesity, unspecified: Secondary | ICD-10-CM | POA: Diagnosis not present

## 2021-12-10 DIAGNOSIS — Z794 Long term (current) use of insulin: Secondary | ICD-10-CM | POA: Diagnosis not present

## 2021-12-10 DIAGNOSIS — I10 Essential (primary) hypertension: Secondary | ICD-10-CM | POA: Diagnosis not present

## 2021-12-10 DIAGNOSIS — I2510 Atherosclerotic heart disease of native coronary artery without angina pectoris: Secondary | ICD-10-CM | POA: Diagnosis not present

## 2021-12-10 DIAGNOSIS — Z6830 Body mass index (BMI) 30.0-30.9, adult: Secondary | ICD-10-CM | POA: Diagnosis not present

## 2021-12-10 DIAGNOSIS — Z20822 Contact with and (suspected) exposure to covid-19: Secondary | ICD-10-CM | POA: Diagnosis not present

## 2021-12-10 DIAGNOSIS — Z79899 Other long term (current) drug therapy: Secondary | ICD-10-CM | POA: Diagnosis not present

## 2021-12-10 DIAGNOSIS — N1832 Chronic kidney disease, stage 3b: Secondary | ICD-10-CM | POA: Diagnosis not present

## 2021-12-10 DIAGNOSIS — I129 Hypertensive chronic kidney disease with stage 1 through stage 4 chronic kidney disease, or unspecified chronic kidney disease: Secondary | ICD-10-CM | POA: Diagnosis not present

## 2021-12-10 DIAGNOSIS — M21372 Foot drop, left foot: Secondary | ICD-10-CM | POA: Diagnosis not present

## 2021-12-10 DIAGNOSIS — N183 Chronic kidney disease, stage 3 unspecified: Secondary | ICD-10-CM | POA: Diagnosis not present

## 2021-12-10 DIAGNOSIS — Z87891 Personal history of nicotine dependence: Secondary | ICD-10-CM | POA: Diagnosis not present

## 2021-12-10 DIAGNOSIS — E1122 Type 2 diabetes mellitus with diabetic chronic kidney disease: Secondary | ICD-10-CM | POA: Diagnosis not present

## 2021-12-10 DIAGNOSIS — R55 Syncope and collapse: Secondary | ICD-10-CM | POA: Diagnosis not present

## 2021-12-10 LAB — CBC
HCT: 28.6 % — ABNORMAL LOW (ref 39.0–52.0)
Hemoglobin: 9.3 g/dL — ABNORMAL LOW (ref 13.0–17.0)
MCH: 25.8 pg — ABNORMAL LOW (ref 26.0–34.0)
MCHC: 32.5 g/dL (ref 30.0–36.0)
MCV: 79.4 fL — ABNORMAL LOW (ref 80.0–100.0)
Platelets: 264 10*3/uL (ref 150–400)
RBC: 3.6 MIL/uL — ABNORMAL LOW (ref 4.22–5.81)
RDW: 16.2 % — ABNORMAL HIGH (ref 11.5–15.5)
WBC: 8.9 10*3/uL (ref 4.0–10.5)
nRBC: 0 % (ref 0.0–0.2)

## 2021-12-10 LAB — BASIC METABOLIC PANEL
Anion gap: 10 (ref 5–15)
BUN: 30 mg/dL — ABNORMAL HIGH (ref 8–23)
CO2: 23 mmol/L (ref 22–32)
Calcium: 9.2 mg/dL (ref 8.9–10.3)
Chloride: 108 mmol/L (ref 98–111)
Creatinine, Ser: 1.44 mg/dL — ABNORMAL HIGH (ref 0.61–1.24)
GFR, Estimated: 50 mL/min — ABNORMAL LOW (ref >60)
Glucose, Bld: 113 mg/dL — ABNORMAL HIGH (ref 70–99)
Potassium: 4.1 mmol/L (ref 3.5–5.1)
Sodium: 141 mmol/L (ref 135–145)

## 2021-12-10 LAB — GLUCOSE, CAPILLARY
Glucose-Capillary: 142 mg/dL — ABNORMAL HIGH (ref 70–99)
Glucose-Capillary: 123 mg/dL — ABNORMAL HIGH (ref 70–99)
Glucose-Capillary: 192 mg/dL — ABNORMAL HIGH (ref 70–99)
Glucose-Capillary: 139 mg/dL — ABNORMAL HIGH (ref 70–99)
Glucose-Capillary: 129 mg/dL — ABNORMAL HIGH (ref 70–99)

## 2021-12-10 MED ORDER — SODIUM CHLORIDE 0.9 % WEIGHT BASED INFUSION: 1.0000 mL/kg/h | INTRAVENOUS | Status: DC

## 2021-12-10 MED ORDER — SODIUM CHLORIDE 0.9 % WEIGHT BASED INFUSION
3.0000 mL/kg/h | INTRAVENOUS | Status: DC
  Administered 2021-12-11: 3 mL/kg/h via INTRAVENOUS

## 2021-12-10 MED ORDER — SODIUM CHLORIDE 0.9% FLUSH
3.0000 mL | Freq: Two times a day (BID) | INTRAVENOUS | Status: DC
  Administered 2021-12-10 – 2021-12-12 (×3): 3 mL via INTRAVENOUS

## 2021-12-10 MED ORDER — SODIUM CHLORIDE 0.9 % IV SOLN: 250.0000 mL | INTRAVENOUS | Status: DC | PRN

## 2021-12-10 MED ORDER — SODIUM CHLORIDE 0.9% FLUSH: 3.0000 mL | INTRAVENOUS | Status: DC | PRN

## 2021-12-10 NOTE — Evaluation (Signed)
Physical Therapy Evaluation/ Discharge Patient Details Name: Tommy Green MRN: [id number] DOB: [birth year] Today's Date: 12/10/2021  History of Present Illness  76 yo admitted 2/22 with presyncope. PMHx: AS, DM, HTN, HLD, CVA, skin CA, anemia, obesity, spinal fusion, SDH s/p crani, RA, neuropathy, gout  Clinical Impression  PT very pleasant and reports 2 recent falls with getting dizzy. Pt actively participating in [place] 2x/wk with wife drivign him and wife assisting with lower body bathing/dressing at home. Pt walks with RW and uses WC for longer community distance. Pt currently at baseline functional level without dizziness during session with noted drop in BP with initial stand with recover after 3 min standing and no symptoms with gait. Pt at baseline mobility with recommendation to continue gait acutely and OPPT. Will sign off with pt and wife aware and agreeable.   Supine 137/84, HR 84 Sitting 135/85, HR 80   Standing 107/77, HR 96 Standing 3 min 122/77, HR 103     Recommendations for follow up therapy are one component of a multi-disciplinary discharge planning process, led by the attending physician.  Recommendations may be updated based on patient status, additional functional criteria and insurance authorization.  Follow Up Recommendations Outpatient PT (return to OPPT)    Assistance Recommended at Discharge Intermittent Supervision/Assistance  Patient can return home with the following  A little help with bathing/dressing/bathroom;Assistance with cooking/housework;Assist for transportation    Equipment Recommendations None recommended by PT  Recommendations for Other Services       Functional Status Assessment Patient has not had a recent decline in their functional status     Precautions / Restrictions Precautions Precautions: Fall Precaution Comments: left drop foot      Mobility  Bed Mobility Overal bed mobility: Modified Independent                   Transfers Overall transfer level: Modified independent                      Ambulation/Gait Ambulation/Gait assistance: Supervision Gait Distance (Feet): 180 Feet Assistive device: Rolling walker (2 wheels) Gait Pattern/deviations: Step-through pattern, Decreased stride length, Decreased dorsiflexion - left   Gait velocity interpretation: 1.31 - 2.62 ft/sec, indicative of limited community ambulator   General Gait Details: pt with drop foot LLE with pt very aware of deficits with cautious but steady gait with use of RW  Stairs            Wheelchair Mobility    Modified Rankin (Stroke Patients Only)       Balance Overall balance assessment: History of Falls, Needs assistance   Sitting balance-Leahy Scale: Good       Standing balance-Leahy Scale: Fair Standing balance comment: able to stand without UE support, rW for gait                             Pertinent Vitals/Pain Pain Assessment Pain Assessment: No/denies pain    Home Living Family/patient expects to be discharged to:: Private residence Living Arrangements: Spouse/significant other Available Help at Discharge: Family;Available 24 hours/day Type of Home: House Home Access: Stairs to enter   [employer] of Steps: 3   Home Layout: One level Home Equipment: Rolling Walker (2 wheels);BSC/3in1;Tub bench;Other (comment);Wheelchair - power (lift chair)      Prior Function Prior Level of Function : Needs assist       Physical Assist : ADLs (physical)   ADLs (  physical): Dressing;Bathing Mobility Comments: RW for gait, uses WC for long distances in community ADLs Comments: wife assists with lower body bathing and dressing     Hand Dominance        Extremity/Trunk Assessment   Upper Extremity Assessment Upper Extremity Assessment: Overall WFL for tasks assessed    Lower Extremity Assessment Lower Extremity Assessment: Overall WFL for tasks assessed     Cervical / Trunk Assessment Cervical / Trunk Assessment: Normal  Communication   Communication: No difficulties  Cognition Arousal/Alertness: Awake/alert Behavior During Therapy: WFL for tasks assessed/performed Overall Cognitive Status: Within Functional Limits for tasks assessed                                          General Comments      Exercises     Assessment/Plan    PT Assessment All further PT needs can be met in the next venue of care  PT Problem List Decreased mobility;Decreased balance;Decreased activity tolerance       PT Treatment Interventions      PT Goals (Current goals can be found in the Care Plan section)  Acute Rehab PT Goals PT Goal Formulation: All assessment and education complete, DC therapy    Frequency       Co-evaluation               AM-PAC PT "6 Clicks" Mobility  Outcome Measure Help needed turning from your back to your side while in a flat bed without using bedrails?: None Help needed moving from lying on your back to sitting on the side of a flat bed without using bedrails?: None Help needed moving to and from a bed to a chair (including a wheelchair)?: None Help needed standing up from a chair using your arms (e.g., wheelchair or bedside chair)?: None Help needed to walk in hospital room?: A Little Help needed climbing 3-5 steps with a railing? : A Little 6 Click Score: 22    End of Session   Activity Tolerance: Patient tolerated treatment well Patient left: in chair;with call bell/phone within reach;with family/visitor present Nurse Communication: Mobility status PT Visit Diagnosis: Other abnormalities of gait and mobility (R26.89);Difficulty in walking, not elsewhere classified (R26.2)    Time: 1916-6060 PT Time Calculation (min) (ACUTE ONLY): 18 min   Charges:   PT Evaluation $PT Eval Moderate Complexity: 1 Mod          [place], PT Acute Rehabilitation Services Pager:  [phone] Office: [phone]   Daking Westervelt B Shantasia Hunnell 12/10/2021, 11:22 AM

## 2021-12-10 NOTE — Hospital Course (Addendum)
The patient is a 76 year old obese Caucasian male with a past medical history significant for but limited to hypertension, hypertriglyceridemia, severe aortic stenosis, history of subdural hemorrhage, diabetes mellitus type 2, RA, history of anemia, history of skin cancer and history of left foot drop as a result of a complication of prior back surgery in 01/2021 who presented with complaints of dizziness.  Patient reports that he is not feeling well for couple weeks now upon awakening yesterday morning he felt dizzy described as he could have passed out.  He denies symptoms of vertigo but he has been having sinus congestion with postnasal drip.  At home his wife is given Dramamine with some mild improvement in symptoms.  He chronically has lower extremity swelling that has not worsened and he denies any chest pain, palpitations, shortness breath or loss of consciousness.  Recent echocardiogram was done in January 2023 which noted to have an EF of 50 to 55% with grade 1 diastolic dysfunction as well as severe aortic stenosis and severely calcified aortic valve.  He is being followed by cardiology in outpatient setting and they had recommended possible TAVR.  Complications during his prior back surgery where he needed to be placed in ICU on pressors.  He is presented to the ED and he is afebrile with blood pressures in the 142L to 953U systolic.  CT scan of the head noted atrophy and chronic ischemic changes.  Labs noted hemoglobin 9.7 and BUN was elevated at 33 and creatinine of 1.5.  COVID testing and influenza testing were negative.  Cardiology was consulted for further evaluation recommendations given his near syncope with question about whether this was related to his aortic stenosis or not.  Orthostatic vital signs were checked and patient was placed on telemetry and PT evaluated and recommended outpatient PT.  Cardiology evaluated for his dizziness and near syncope and they felt that his presenting symptoms  could be a form of sinus congestion versus aortic stenosis and they had a long conversation about his aortic stenosis and they are going to do a right heart cath with coronary angiography study in the morning with plans for outpatient CT Scan and Cardiothoracic Surgery Consultation.   Patient underwent his cardiac catheterization which and showed moderate nonobstructive coronary disease and the recommendation was to continue with plans for TAVR evaluation.  Cardiology recommended gentle post-cath hydration due to his renal insufficiency.  Cardiology discussed his findings on his cath with the patient.  Patient was to be discharged home after his cardiac cath and his TR band weaning but family was concerned that if the patient discharged yesterday he would not be able to use walker because he cannot bend his wrist and apply pressure.  Patient's wife felt that she could not help get him in the house and rather him stay the night so cardiology states the patient will stay overnight and discharge in the morning.    This morning the patient is stable to be discharged home and he did not have orthostatic hypotension.  He is stable for discharge and will follow up with PCP and cardiology and continue to have outpatient work up for his TAVR.

## 2021-12-10 NOTE — Progress Notes (Signed)
Progress Note   Patient: Tommy Green Holden WUJ:[id number] DOB: 1946/07/20 DOA: 12/09/2021     0 DOS: the patient was seen and examined on 12/10/2021   Brief hospital course: The patient is a 76 year old obese Caucasian male with a past medical history significant for but limited to hypertension, hypertriglyceridemia, severe aortic stenosis, history of subdural hemorrhage, diabetes mellitus type 2, RA, history of anemia, history of skin cancer and history of left foot drop as a result of a complication of prior back surgery in 01/2021 who presented with complaints of dizziness.  Patient reports that he is not feeling well for couple weeks now upon awakening yesterday morning he felt dizzy described as he could have passed out.  He denies symptoms of vertigo but he has been having sinus congestion with postnasal drip.  At home his wife is given Dramamine with some mild improvement in symptoms.  He chronically has lower extremity swelling that has not worsened and he denies any chest pain, palpitations, shortness breath or loss of consciousness.  Recent echocardiogram was done in January 2023 which noted to have an EF of 50 to 55% with grade 1 diastolic dysfunction as well as severe aortic stenosis and severely calcified aortic valve.  He is being followed by cardiology in outpatient setting and they had recommended possible TAVR.  Complications during his prior back surgery where he needed to be placed in ICU on pressors.  He is presented to the ED and he is afebrile with blood pressures in the 956O to 130Q systolic.  CT scan of the head noted atrophy and chronic ischemic changes.  Labs noted hemoglobin 9.7 and BUN was elevated at 33 and creatinine of 1.5.  COVID testing and influenza testing were negative.  Cardiology was consulted for further evaluation recommendations given his near syncope with question about whether this was related to his aortic stenosis or not.  Orthostatic vital signs were checked and  patient was placed on telemetry and PT evaluated and recommended outpatient PT.  Cardiology evaluated for his dizziness and near syncope and they felt that his presenting symptoms could be a form of sinus congestion versus aortic stenosis and they had a long conversation about his aortic stenosis and they are going to do a right heart cath with coronary angiography study in the morning with plans for outpatient CT Scan and Cardiothoracic Surgery Consultation.   Assessment and Plan: * Near syncope- (present on admission) -Patient presents with complaints of dizziness and states that he felt like he could have passed out after standing this morning.   -Family question if symptoms could be secondary to patient's aortic stenosis for which EMS was called.   -CT imaging of the brain did not note any acute abnormality. -Repeat orthostatic vital signs did show that he did drop as BP lying was 137/84 and Sitting was 135/85 and standing was 107/77 -Repeat Orthostatic in the AM  -Will order TED Hose and if necessary Abdominal Binder -Continue Telemetry Monitoring   -PT Evaluating and recommending Outpatient PT -Appreciate Cardiology consultative services and they are planning a RHC and Coronary Angiography Study tomorrow with outpatient CT Scan and Cardiothoracic Surgery Consultation    Microcytic anemia -Patient's Hgb/Hct has gone from 10.6/33.2 -> 9.7/30.8 -> 9.3/79.4 -Has a Hx IDA -Check Anemia Panel in the AM -Continue to Monitor for S/Sx of Bleeding; No overt bleeding noted -Repeat CBC in the AM   Rheumatoid arthritis ([place])- (present on admission) -Patient has Rheumatoid arthritis involving multiple joints with positive rheumatoid  factor.  -Patient is on Arava milligrams daily and Enbrel injections q. Weekly which is currently being held  Aortic stenosis -Patient noted to have severe aortic stenosis by last echocardiogram.   -Currently being followed by cardiology for possible need of  TAVR. -Cardiology consulted and will continue to engage patient in regards to further work-up and planning Right Heart Catheterization and Coronary Angiography Study tomorrow  Stage 3 chronic kidney disease ([place])- (present on admission) -Stable.  -Creatinine 1.5 on admission which appears near patient's baseline. -Repeat BUNs/creatinine is 30/1.44 -Avoid nephrotoxic medications, contrast dyes, hypotension and adjust medications -Repeat CMP in the AM   Hypertriglyceridemia- (present on admission) -Continue Ezetimibe 10 mg po qHS and Fenofibrate 160 mg po Daily  Type 2 diabetes mellitus with diabetic neuropathy, unspecified ([place])- (present on admission) -On admission blood sugar was noted to be 143. -Home insulin regimen includes Tresiba 30 units nightly and sliding scale of insulin and 0 to 10 units with meals. -C/w Hypoglycemic protocols -CBGs before every meal with sensitive sliding scale of insulin -Continue Gabapentin for Neuropathy -CBG's ranging from 124-145 -Resumed Tresiba substitution with Semglee 30 units Daily  Essential hypertension- (present on admission) -Blood pressures are currently maintained.  Home medication regimen includes Amlodipine 5 mg nightly, Lisinopril 40 mg q. Morning.   -He reports that he was taken off of carvedilol last month due to it possibly causing dry mouth symptoms. -Continue current blood pressure regimen -Continue to Monitor BP per Protocol and Last BP reading was 128/72  Subjective: Seen and examined at bedside and he is feeling better today.  Denies any nausea or vomiting.  Denies lightheadedness or dizziness.  Feels okay.  No other concerns or complaints at this time  Physical Exam: Vitals:   12/10/21 1023 12/10/21 1100 12/10/21 1118 12/10/21 1300  BP: 132/87 137/84  128/72  Pulse:  74 (!) 103 83  Resp:    20  Temp:    98.6 F (37 C)  TempSrc:    Oral  SpO2:  98% 98% 97%  Weight:      Height:       Examination: Physical  Exam:  Constitutional: WN/WD obese elderly Caucasian male in NAD and appears calm and comfortable Respiratory: Diminished to auscultation bilaterally, no wheezing, rales, rhonchi or crackles. Normal respiratory effort and patient is not tachypenic. No accessory muscle use. Unlabored breathing  Cardiovascular: RRR, no murmurs / rubs / gallops. S1 and S2 auscultated. Mild LE Edema Abdomen: Soft, non-tender, Distended 2/2 body habitus. Bowel sounds positive.  GU: Deferred. Skin: No rashes, lesions, ulcers on a limited skin evaluation. No induration; Warm and dry.  Neurologic: CN 2-12 grossly intact with no focal deficits.  Data Reviewed:  I have independently reviewed and interpreted the patient's BMP and CBC.  Patient's BUNs/creatinine is slightly improved and is now 37.44 and his hemoglobin/hematocrit is now 9.3/28.6 with an MCV of 79.4  Family Communication: Discussed with wife at bedside   Disposition: Status is: Observation The patient will require care spanning > 2 midnights and should be moved to inpatient because: Cardiology is planning on a cardiac catheterization in the morning  Planned Discharge Destination: Home  Author: Raiford Noble, DO [facility]  12/10/2021 1:30 PM  For on call review [email or web address].

## 2021-12-10 NOTE — Assessment & Plan Note (Signed)
-  Complicates overall prognosis and care -Estimated body mass index is 30.92 kg/m as calculated from the following:   Height as of this encounter: 5\' 11"  (1.803 m).   Weight as of this encounter: 100.6 kg.  -Weight Loss and Dietary Counseling given

## 2021-12-10 NOTE — Care Management Obs Status (Signed)
[place] NOTIFICATION   Patient Details  Name: Tommy Green MRN: [id number] Date of Birth: October 14, 1946   Medicare Observation Status Notification Given:  Yes    Bethena Roys, RN 12/10/2021, 3:40 PM

## 2021-12-10 NOTE — Progress Notes (Addendum)
Progress Note  Patient Name: Tommy Green Date of Encounter: 12/10/2021  [facility] Cardiologist: Shirlee More, MD   Subjective   Feeling well. No chest pain, sob or palpitations.    Inpatient Medications    Scheduled Meds:  amLODipine  5 mg Oral QHS   enoxaparin (LOVENOX) injection  50 mg Subcutaneous Q24H   ezetimibe  10 mg Oral QHS   fenofibrate  160 mg Oral Daily   gabapentin  200 mg Oral QHS   guaiFENesin  600 mg Oral BID   HYDROcodone-acetaminophen  0.5 tablet Oral QHS   insulin aspart  0-9 Units Subcutaneous TID WC   insulin glargine-yfgn  30 Units Subcutaneous Q2200   leflunomide  10 mg Oral Q breakfast   lisinopril  40 mg Oral Daily   magnesium oxide  400 mg Oral Q lunch   melatonin  5 mg Oral QHS   pantoprazole  40 mg Oral Daily   sodium chloride flush  3 mL Intravenous Q12H   Continuous Infusions:  PRN Meds: acetaminophen **OR** acetaminophen, albuterol, HYDROcodone-acetaminophen, ondansetron **OR** ondansetron (ZOFRAN) IV   Vital Signs    Vitals:   12/09/21 2013 12/09/21 2333 12/10/21 0652 12/10/21 0750  BP: (!) 149/82 135/72 (!) 155/83 (!) 155/83  Pulse: 80 80 80 84  Resp: 16 16 16 16   Temp: 98.1 F (36.7 C) 98.1 F (36.7 C) 98.9 F (37.2 C) 98.8 F (37.1 C)  TempSrc: Oral Oral Oral Oral  SpO2: 96% 96% 95% 97%  Weight:      Height:        Intake/Output Summary (Last 24 hours) at 12/10/2021 0858 Last data filed at 12/10/2021 0300 Gross per 24 hour  Intake 140 ml  Output 800 ml  Net -660 ml   Last 3 Weights 12/09/2021 12/09/2021 11/27/2021  Weight (lbs) 221 lb 11.2 oz 223 lb 8.7 oz 223 lb 8 oz  Weight (kg) 100.562 kg 101.4 kg 101.379 kg      Telemetry    NSR - Personally Reviewed  ECG    N/A  Physical Exam   GEN: No acute distress.   Neck: No JVD Cardiac: RRR, 3-7/8 systolic murmurs, rubs, or gallops.  Respiratory: Clear to auscultation bilaterally. GI: Soft, nontender, non-distended  MS: No edema; No deformity. Neuro:   Nonfocal  Psych: Normal affect   Labs    Chemistry Recent Labs  Lab 12/09/21 1058 12/10/21 0227  NA 135 141  K 4.2 4.1  CL 103 108  CO2 22 23  GLUCOSE 143* 113*  BUN 33* 30*  CREATININE 1.50* 1.44*  CALCIUM 8.7* 9.2  GFRNONAA 48* 50*  ANIONGAP 10 10    Hematology Recent Labs  Lab 12/09/21 1058 12/10/21 0227  WBC 8.0 8.9  RBC 3.79* 3.60*  HGB 9.7* 9.3*  HCT 30.8* 28.6*  MCV 81.3 79.4*  MCH 25.6* 25.8*  MCHC 31.5 32.5  RDW 16.4* 16.2*  PLT 276 264    Radiology    CT Head Wo Contrast  Result Date: 12/09/2021 CLINICAL DATA:  Frontal headache and dizziness EXAM: CT HEAD WITHOUT CONTRAST TECHNIQUE: Contiguous axial images were obtained from the base of the skull through the vertex without intravenous contrast. RADIATION DOSE REDUCTION: This exam was performed according to the departmental dose-optimization program which includes automated exposure control, adjustment of the mA and/or kV according to patient size and/or use of iterative reconstruction technique. COMPARISON:  CT head 03/05/2021 FINDINGS: Brain: Generalized atrophy. Chronic infarct left frontal lobe unchanged. Negative for acute  infarct, hemorrhage, mass Vascular: Negative for hyperdense vessel Skull: Left parietal craniotomy. Craniotomy flap in good position. No acute skeletal abnormality Sinuses/Orbits: Mild mucosal edema paranasal sinuses. Air-fluid level right sphenoid sinus. Negative orbit Other: None IMPRESSION: Atrophy and chronic ischemic change.  No acute abnormality. Electronically Signed   By: Franchot Gallo M.D.   On: 12/09/2021 11:52    Cardiac Studies   Echo 10/21/21  1. Vertical orientation on PS views. Left ventricular ejection fraction,  by estimation, is 50 to 55%. The left ventricle has low normal function.  The left ventricle has no regional wall motion abnormalities. There is  mild concentric left ventricular  hypertrophy. Left ventricular diastolic parameters are consistent with  Grade I  diastolic dysfunction (impaired relaxation).   2. Right ventricular systolic function is normal. The right ventricular  size is normal.   3. Left atrial size was moderately dilated.   4. The mitral valve is normal in structure. Trivial mitral valve  regurgitation. No evidence of mitral stenosis.   5. Normal SVI/Flow      . The aortic valve is tricuspid. There is severe calcifcation of the  aortic valve. There is moderate thickening of the aortic valve. Aortic  valve regurgitation is not visualized. Severe aortic valve stenosis.   6. There is mild dilatation of the ascending aorta, measuring 37 mm.   7. The inferior vena cava is normal in size with greater than 50%  respiratory variability, suggesting right atrial pressure of 3 mmHg.   Comparison(s): LVEF 55-60%, Severe AS.   Patient Profile     76 y.o. male with a hx of diabetes, hypertension, severe aortic stenosis, hyperlipidemia, prior stroke, skin cancer, iron deficiency anemia, obesity and CKD III who is being seen 12/09/2021 for the evaluation of severe aortic stenosis, presyncope at the request of Dr. Tamala Julian.   Assessment & Plan    Dizziness/near syncope Severe AS - His presenting symptoms felt could be form sinus congestion vs aortic stenosis. He is back to baseline.  - Recent echo 10/2021 with severe AS. Aortic  valve mean gradient measures 44.8  mmHg. Aortic valve peak gradient measures 67.8 mmHg. Aortic valve area, by  VTI measures 0.75 cm.  - Likely outpatient work up for TVAR - Dr. Ali Lowe to see and give final recommendations.      For questions or updates, please contact [place] Please consult [email or web address] for contact info under        Signed, Leanor Kail, PA  12/10/2021, 8:58 AM     ATTENDING ATTESTATION:  After conducting a review of all available clinical information with the care team, interviewing the patient, and performing a physical exam, I agree with the findings and plan described in  this note.   GEN: No acute distress.   Cardiac: RRR, 3/6 SEM without rubs, or gallops.  Respiratory: Clear to auscultation bilaterally. GI: Soft, nontender, non-distended  MS: No edema; No deformity. Neuro:  Nonfocal  Vasc:  +2 radial pulses  Patient doing well without recurrent presyncope or syncope.  Had a long conversation with the patient and his wife about evaluation for aortic stenosis.  We will refer him for right heart catheterization and coronary angiography study tomorrow.  Following this he will be discharged for outpatient CT scan and cardiothoracic surgery consultation.  Patient and wife agree with plan.  Lenna Sciara, MD Pager [phone]

## 2021-12-10 NOTE — Assessment & Plan Note (Addendum)
-  Patient's Hgb/Hct has gone from 10.6/33.2 -> 9.7/30.8 -> 9.3/79.4 -> 9.9/29.0 and is now 9.6 -Has a Hx IDA -Check Anemia Panel in the outpatient setting -Continue to Monitor for S/Sx of Bleeding; No overt bleeding noted -Repeat CBC in the AM

## 2021-12-10 NOTE — Progress Notes (Signed)
Consult for 2nd IV for Cath. Heart Cath scheduled for tomorrow per nurse. Instructed (via secure chat) nurse to re-consult IV team tomorrow, unless other needs arise. Fran Lowes, RN [facility]

## 2021-12-10 NOTE — Care Management (Signed)
1547 12-10-21 Ambulatory referral submitted to outpatient rehab for physical therapy. No further needs identified at this time.

## 2021-12-10 NOTE — Plan of Care (Signed)

## 2021-12-11 ENCOUNTER — Encounter (HOSPITAL_COMMUNITY): Payer: Self-pay | Admitting: Interventional Cardiology

## 2021-12-11 ENCOUNTER — Ambulatory Visit (HOSPITAL_COMMUNITY)
Admission: EM | Disposition: A | Discharge status: Home / Self Care | Payer: Self-pay | Source: Home / Self Care | Attending: Emergency Medicine

## 2021-12-11 DIAGNOSIS — I350 Nonrheumatic aortic (valve) stenosis: Secondary | ICD-10-CM | POA: Diagnosis not present

## 2021-12-11 DIAGNOSIS — E669 Obesity, unspecified: Secondary | ICD-10-CM | POA: Diagnosis not present

## 2021-12-11 DIAGNOSIS — N1832 Chronic kidney disease, stage 3b: Secondary | ICD-10-CM | POA: Diagnosis not present

## 2021-12-11 DIAGNOSIS — D509 Iron deficiency anemia, unspecified: Secondary | ICD-10-CM | POA: Diagnosis not present

## 2021-12-11 DIAGNOSIS — I2510 Atherosclerotic heart disease of native coronary artery without angina pectoris: Secondary | ICD-10-CM

## 2021-12-11 DIAGNOSIS — I10 Essential (primary) hypertension: Secondary | ICD-10-CM | POA: Diagnosis not present

## 2021-12-11 DIAGNOSIS — M21372 Foot drop, left foot: Secondary | ICD-10-CM | POA: Diagnosis not present

## 2021-12-11 DIAGNOSIS — R55 Syncope and collapse: Secondary | ICD-10-CM | POA: Diagnosis not present

## 2021-12-11 HISTORY — PX: RIGHT/LEFT HEART CATH AND CORONARY ANGIOGRAPHY: CATH118266

## 2021-12-11 LAB — CBC WITH DIFFERENTIAL/PLATELET
Abs Immature Granulocytes: 0.03 10*3/uL (ref 0.00–0.07)
Basophils Absolute: 0.1 10*3/uL (ref 0.0–0.1)
Basophils Relative: 1 %
Eosinophils Absolute: 0.3 10*3/uL (ref 0.0–0.5)
Eosinophils Relative: 4 %
HCT: 30.5 % — ABNORMAL LOW (ref 39.0–52.0)
Hemoglobin: 9.8 g/dL — ABNORMAL LOW (ref 13.0–17.0)
Immature Granulocytes: 0 %
Lymphocytes Relative: 36 %
Lymphs Abs: 3 10*3/uL (ref 0.7–4.0)
MCH: 25.6 pg — ABNORMAL LOW (ref 26.0–34.0)
MCHC: 32.1 g/dL (ref 30.0–36.0)
MCV: 79.6 fL — ABNORMAL LOW (ref 80.0–100.0)
Monocytes Absolute: 0.9 10*3/uL (ref 0.1–1.0)
Monocytes Relative: 10 %
Neutro Abs: 4.2 10*3/uL (ref 1.7–7.7)
Neutrophils Relative %: 49 %
Platelets: 295 10*3/uL (ref 150–400)
RBC: 3.83 MIL/uL — ABNORMAL LOW (ref 4.22–5.81)
RDW: 16.4 % — ABNORMAL HIGH (ref 11.5–15.5)
WBC: 8.5 10*3/uL (ref 4.0–10.5)
nRBC: 0 % (ref 0.0–0.2)

## 2021-12-11 LAB — POCT I-STAT 7, (LYTES, BLD GAS, ICA,H+H)
Acid-base deficit: 3 mmol/L — ABNORMAL HIGH (ref 0.0–2.0)
Bicarbonate: 21.9 mmol/L (ref 20.0–28.0)
Calcium, Ion: 1.15 mmol/L (ref 1.15–1.40)
HCT: 29 % — ABNORMAL LOW (ref 39.0–52.0)
Hemoglobin: 9.9 g/dL — ABNORMAL LOW (ref 13.0–17.0)
O2 Saturation: 98 %
Potassium: 4 mmol/L (ref 3.5–5.1)
Sodium: 141 mmol/L (ref 135–145)
TCO2: 23 mmol/L (ref 22–32)
pCO2 arterial: 36.6 mmHg (ref 32–48)
pH, Arterial: 7.386 (ref 7.35–7.45)
pO2, Arterial: 104 mmHg (ref 83–108)

## 2021-12-11 LAB — COMPREHENSIVE METABOLIC PANEL
ALT: 12 U/L (ref 0–44)
AST: 17 U/L (ref 15–41)
Albumin: 3.2 g/dL — ABNORMAL LOW (ref 3.5–5.0)
Alkaline Phosphatase: 29 U/L — ABNORMAL LOW (ref 38–126)
Anion gap: 9 (ref 5–15)
BUN: 27 mg/dL — ABNORMAL HIGH (ref 8–23)
CO2: 21 mmol/L — ABNORMAL LOW (ref 22–32)
Calcium: 9.2 mg/dL (ref 8.9–10.3)
Chloride: 108 mmol/L (ref 98–111)
Creatinine, Ser: 1.57 mg/dL — ABNORMAL HIGH (ref 0.61–1.24)
GFR, Estimated: 45 mL/min — ABNORMAL LOW (ref >60)
Glucose, Bld: 119 mg/dL — ABNORMAL HIGH (ref 70–99)
Potassium: 4 mmol/L (ref 3.5–5.1)
Sodium: 138 mmol/L (ref 135–145)
Total Bilirubin: 0.6 mg/dL (ref 0.3–1.2)
Total Protein: 5.9 g/dL — ABNORMAL LOW (ref 6.5–8.1)

## 2021-12-11 LAB — PHOSPHORUS: Phosphorus: 4.1 mg/dL (ref 2.5–4.6)

## 2021-12-11 LAB — POCT I-STAT EG7
Acid-base deficit: 1 mmol/L (ref 0.0–2.0)
Acid-base deficit: 2 mmol/L (ref 0.0–2.0)
Bicarbonate: 23.7 mmol/L (ref 20.0–28.0)
Bicarbonate: 23.3 mmol/L (ref 20.0–28.0)
Calcium, Ion: 1.14 mmol/L — ABNORMAL LOW (ref 1.15–1.40)
Calcium, Ion: 1.17 mmol/L (ref 1.15–1.40)
HCT: 29 % — ABNORMAL LOW (ref 39.0–52.0)
HCT: 29 % — ABNORMAL LOW (ref 39.0–52.0)
Hemoglobin: 9.9 g/dL — ABNORMAL LOW (ref 13.0–17.0)
Hemoglobin: 9.9 g/dL — ABNORMAL LOW (ref 13.0–17.0)
O2 Saturation: 66 %
O2 Saturation: 64 %
Potassium: 4.2 mmol/L (ref 3.5–5.1)
Potassium: 4.2 mmol/L (ref 3.5–5.1)
Sodium: 141 mmol/L (ref 135–145)
Sodium: 141 mmol/L (ref 135–145)
TCO2: 25 mmol/L (ref 22–32)
TCO2: 24 mmol/L (ref 22–32)
pCO2, Ven: 37.6 mmHg — ABNORMAL LOW (ref 44–60)
pCO2, Ven: 39.4 mmHg — ABNORMAL LOW (ref 44–60)
pH, Ven: 7.408 (ref 7.25–7.43)
pH, Ven: 7.379 (ref 7.25–7.43)
pO2, Ven: 34 mmHg (ref 32–45)
pO2, Ven: 34 mmHg (ref 32–45)

## 2021-12-11 LAB — GLUCOSE, CAPILLARY
Glucose-Capillary: 137 mg/dL — ABNORMAL HIGH (ref 70–99)
Glucose-Capillary: 137 mg/dL — ABNORMAL HIGH (ref 70–99)
Glucose-Capillary: 170 mg/dL — ABNORMAL HIGH (ref 70–99)
Glucose-Capillary: 126 mg/dL — ABNORMAL HIGH (ref 70–99)

## 2021-12-11 LAB — MAGNESIUM: Magnesium: 1.7 mg/dL (ref 1.7–2.4)

## 2021-12-11 SURGERY — RIGHT/LEFT HEART CATH AND CORONARY ANGIOGRAPHY
Anesthesia: LOCAL

## 2021-12-11 MED ORDER — SODIUM CHLORIDE 0.9% FLUSH: 3.0000 mL | INTRAVENOUS | Status: DC | PRN

## 2021-12-11 MED ORDER — VERAPAMIL HCL 2.5 MG/ML IV SOLN
INTRAVENOUS | Status: DC | PRN
  Administered 2021-12-11: 10 mL via INTRA_ARTERIAL

## 2021-12-11 MED ORDER — MIDAZOLAM HCL 2 MG/2ML IJ SOLN
INTRAMUSCULAR | Status: AC
  Filled 2021-12-11: qty 2

## 2021-12-11 MED ORDER — FENTANYL CITRATE (PF) 100 MCG/2ML IJ SOLN
INTRAMUSCULAR | Status: DC | PRN
  Administered 2021-12-11: 25 ug via INTRAVENOUS

## 2021-12-11 MED ORDER — FENTANYL CITRATE (PF) 100 MCG/2ML IJ SOLN
INTRAMUSCULAR | Status: AC
  Filled 2021-12-11: qty 2

## 2021-12-11 MED ORDER — HEPARIN (PORCINE) IN NACL 1000-0.9 UT/500ML-% IV SOLN
INTRAVENOUS | Status: DC | PRN
  Administered 2021-12-11 (×2): 500 mL

## 2021-12-11 MED ORDER — SODIUM CHLORIDE 0.9% FLUSH
3.0000 mL | Freq: Two times a day (BID) | INTRAVENOUS | Status: DC
  Administered 2021-12-12: 3 mL via INTRAVENOUS

## 2021-12-11 MED ORDER — SODIUM CHLORIDE 0.9 % IV SOLN: 250.0000 mL | INTRAVENOUS | Status: DC | PRN

## 2021-12-11 MED ORDER — IOHEXOL 350 MG/ML SOLN
INTRAVENOUS | Status: DC | PRN
  Administered 2021-12-11: 95 mL

## 2021-12-11 MED ORDER — ACETAMINOPHEN 325 MG PO TABS: 650.0000 mg | ORAL_TABLET | ORAL | Status: DC | PRN

## 2021-12-11 MED ORDER — LIDOCAINE HCL (PF) 1 % IJ SOLN
INTRAMUSCULAR | Status: AC
  Filled 2021-12-11: qty 30

## 2021-12-11 MED ORDER — HEPARIN SODIUM (PORCINE) 1000 UNIT/ML IJ SOLN
INTRAMUSCULAR | Status: AC
  Filled 2021-12-11: qty 10

## 2021-12-11 MED ORDER — HEPARIN SODIUM (PORCINE) 5000 UNIT/ML IJ SOLN
5000.0000 [IU] | Freq: Three times a day (TID) | INTRAMUSCULAR | Status: DC
  Administered 2021-12-11 – 2021-12-12 (×2): 5000 [IU] via SUBCUTANEOUS
  Filled 2021-12-11 (×2): qty 1

## 2021-12-11 MED ORDER — MAGNESIUM SULFATE 2 GM/50ML IV SOLN
2.0000 g | Freq: Once | INTRAVENOUS | Status: AC
  Administered 2021-12-11: 2 g via INTRAVENOUS
  Filled 2021-12-11: qty 50

## 2021-12-11 MED ORDER — HYDRALAZINE HCL 20 MG/ML IJ SOLN: 10.0000 mg | INTRAMUSCULAR | Status: AC | PRN

## 2021-12-11 MED ORDER — ASPIRIN 81 MG PO CHEW
81.0000 mg | CHEWABLE_TABLET | Freq: Once | ORAL | Status: AC
  Administered 2021-12-11: 81 mg via ORAL
  Filled 2021-12-11: qty 1

## 2021-12-11 MED ORDER — VERAPAMIL HCL 2.5 MG/ML IV SOLN
INTRAVENOUS | Status: AC
  Filled 2021-12-11: qty 2

## 2021-12-11 MED ORDER — MIDAZOLAM HCL 2 MG/2ML IJ SOLN
INTRAMUSCULAR | Status: DC | PRN
Start: 2021-12-11 — End: 2021-12-11
  Administered 2021-12-11: 2 mg via INTRAVENOUS

## 2021-12-11 MED ORDER — HEPARIN (PORCINE) IN NACL 1000-0.9 UT/500ML-% IV SOLN
INTRAVENOUS | Status: AC
  Filled 2021-12-11: qty 1000

## 2021-12-11 MED ORDER — ENOXAPARIN SODIUM 40 MG/0.4ML IJ SOSY: 40.0000 mg | PREFILLED_SYRINGE | INTRAMUSCULAR | Status: DC

## 2021-12-11 MED ORDER — ONDANSETRON HCL 4 MG/2ML IJ SOLN: 4.0000 mg | Freq: Four times a day (QID) | INTRAMUSCULAR | Status: DC | PRN

## 2021-12-11 MED ORDER — SODIUM CHLORIDE 0.9 % IV SOLN: INTRAVENOUS | Status: AC

## 2021-12-11 MED ORDER — HEPARIN SODIUM (PORCINE) 1000 UNIT/ML IJ SOLN
INTRAMUSCULAR | Status: DC | PRN
  Administered 2021-12-11: 3000 [IU] via INTRAVENOUS

## 2021-12-11 MED ORDER — LABETALOL HCL 5 MG/ML IV SOLN: 10.0000 mg | INTRAVENOUS | Status: AC | PRN

## 2021-12-11 SURGICAL SUPPLY — 15 items
CATH 5FR JL3.5 JR4 ANG PIG MP (CATHETERS) ×1 IMPLANT
CATH BALLN WEDGE 5F 110CM (CATHETERS) ×1 IMPLANT
CATH INFINITI 5 FR AR1 MOD (CATHETERS) ×1 IMPLANT
CATH INFINITI 5FR AL1 (CATHETERS) ×1 IMPLANT
DEVICE RAD COMP TR BAND LRG (VASCULAR PRODUCTS) ×1 IMPLANT
GLIDESHEATH SLEND SS 6F .021 (SHEATH) ×1 IMPLANT
GUIDEWIRE INQWIRE 1.5J.035X260 (WIRE) IMPLANT
INQWIRE 1.5J .035X260CM (WIRE) ×2
KIT HEART LEFT (KITS) ×2 IMPLANT
PACK CARDIAC CATHETERIZATION (CUSTOM PROCEDURE TRAY) ×2 IMPLANT
SHEATH 6FR 75 DEST SLENDER (SHEATH) ×1 IMPLANT
SHEATH GLIDE SLENDER 4/5FR (SHEATH) ×1 IMPLANT
SYR MEDRAD MARK 7 150ML (SYRINGE) ×2 IMPLANT
TRANSDUCER W/STOPCOCK (MISCELLANEOUS) ×2 IMPLANT
TUBING CIL FLEX 10 FLL-RA (TUBING) ×2 IMPLANT

## 2021-12-11 NOTE — Interval H&P Note (Signed)
Cath Lab Visit (complete for each Cath Lab visit)  Clinical Evaluation Leading to the Procedure:   ACS: Yes.    Non-ACS:    Anginal Classification: CCS IV  Anti-ischemic medical therapy: Minimal Therapy (1 class of medications)  Non-Invasive Test Results: High-risk stress test findings: cardiac mortality >3%/year  Prior CABG: No previous CABG  Severe AS    History and Physical Interval Note:  12/11/2021 10:40 AM  Tommy Green  has presented today for surgery, with the diagnosis of severe aortic stenosis.  The various methods of treatment have been discussed with the patient and family. After consideration of risks, benefits and other options for treatment, the patient has consented to  Procedure(s): RIGHT/LEFT HEART CATH AND CORONARY ANGIOGRAPHY (N/A) as a surgical intervention.  The patient's history has been reviewed, patient examined, no change in status, stable for surgery.  I have reviewed the patient's chart and labs.  Questions were answered to the patient's satisfaction.     Larae Grooms

## 2021-12-11 NOTE — Progress Notes (Signed)
Progress Note   Patient: Tina Temme Argo RWE:[id number] DOB: 10/04/1946 DOA: 12/09/2021     0 DOS: the patient was seen and examined on 12/11/2021   Brief hospital course: The patient is a 76 year old obese Caucasian male with a past medical history significant for but limited to hypertension, hypertriglyceridemia, severe aortic stenosis, history of subdural hemorrhage, diabetes mellitus type 2, RA, history of anemia, history of skin cancer and history of left foot drop as a result of a complication of prior back surgery in 01/2021 who presented with complaints of dizziness.  Patient reports that he is not feeling well for couple weeks now upon awakening yesterday morning he felt dizzy described as he could have passed out.  He denies symptoms of vertigo but he has been having sinus congestion with postnasal drip.  At home his wife is given Dramamine with some mild improvement in symptoms.  He chronically has lower extremity swelling that has not worsened and he denies any chest pain, palpitations, shortness breath or loss of consciousness.  Recent echocardiogram was done in January 2023 which noted to have an EF of 50 to 55% with grade 1 diastolic dysfunction as well as severe aortic stenosis and severely calcified aortic valve.  He is being followed by cardiology in outpatient setting and they had recommended possible TAVR.  Complications during his prior back surgery where he needed to be placed in ICU on pressors.  He is presented to the ED and he is afebrile with blood pressures in the 619J to 093O systolic.  CT scan of the head noted atrophy and chronic ischemic changes.  Labs noted hemoglobin 9.7 and BUN was elevated at 33 and creatinine of 1.5.  COVID testing and influenza testing were negative.  Cardiology was consulted for further evaluation recommendations given his near syncope with question about whether this was related to his aortic stenosis or not.  Orthostatic vital signs were checked and  patient was placed on telemetry and PT evaluated and recommended outpatient PT.  Cardiology evaluated for his dizziness and near syncope and they felt that his presenting symptoms could be a form of sinus congestion versus aortic stenosis and they had a long conversation about his aortic stenosis and they are going to do a right heart cath with coronary angiography study in the morning with plans for outpatient CT Scan and Cardiothoracic Surgery Consultation.   Patient underwent his cardiac catheterization today and showed moderate nonobstructive coronary disease and the recommendation was to continue with plans for TAVR evaluation.  Cardiology recommended gentle post-cath hydration due to his renal insufficiency.  Cardiology discussed his findings on his cath with the patient.  Patient was to be discharged home after his cardiac cath and his TR band weaning but family was concerned that if the patient discharged today he would not be able to use walker because he cannot bend his wrist and apply pressure.  Patient's wife felt that she could not help get him in the house and rather him stay the night so cardiology states the patient will stay overnight and discharge in the morning.  We will need to repeat his orthostatic vital signs prior to discharge as well.  Assessment and Plan: * Near syncope- (present on admission) -Patient presents with complaints of dizziness and states that he felt like he could have passed out after standing this morning.   -Family question if symptoms could be secondary to patient's aortic stenosis for which EMS was called.   -CT imaging of the brain  did not note any acute abnormality. -Repeat Orthostatic in the AM  -Will order TED Hose and if necessary Abdominal Binder -Continue Telemetry Monitoring   -PT Evaluating and recommending Outpatient PT -Appreciate Cardiology consultative services and they are planning a RHC and Coronary Angiography Study tomorrow with outpatient  CT Scan and Cardiothoracic Surgery Consultation -[place] cath done and showed "  Ost LAD to Prox LAD lesion is 50% stenosed.   Mid LAD lesion is 50% stenosed.   Mid RCA lesion is 50% stenosed.   Aortic saturation 98%, PA saturation 65%, PA pressure 19/6, mean PA pressure 11 mmHg, mean pulmonary capillary wedge pressure 9 mmHg, cardiac output 6.6 L/min, cardiac index 3.0.   Severe right subclavian tortuosity.  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   Moderate, nonobstructive coronary artery disease.  Continue with plans for TAVR evaluation.  Plan discussed with Dr. Ali Lowe.   Severe tortuosity of the right subclavian.  If cardiac cath was attempted in the future, would use a different approach.  If right radial had to be used, would need an 85 cm destination sheath.  75 cm sheath was too short.   Gentle post-cath hydration due to renal insufficiency. -Repeat Orthostatic VS and will not D/C today given inability to bend wrist and place weight on Walker    Obesity (BMI 61-60.7) -Complicates overall prognosis and care -Estimated body mass index is 30.92 kg/m as calculated from the following:   Height as of this encounter: 5\' 11"  (1.803 m).   Weight as of this encounter: 100.6 kg.  -Weight Loss and Dietary Counseling given   Microcytic anemia -Patient's Hgb/Hct has gone from 10.6/33.2 -> 9.7/30.8 -> 9.3/79.4 -> 9.9/29.0 -Has a Hx IDA -Check Anemia Panel in the AM -Continue to Monitor for S/Sx of Bleeding; No overt bleeding noted -Repeat CBC in the AM   Rheumatoid arthritis ([place])- (present on admission) -Patient has Rheumatoid arthritis involving multiple joints with positive rheumatoid factor.  -Patient is on Arava milligrams daily and Enbrel injections q. Weekly which is currently being held  Aortic stenosis -Patient noted to have severe aortic stenosis by last echocardiogram.   -Currently being followed by cardiology for possible need of TAVR. -Cardiology consulted and will continue to engage  patient in regards to further work-up and planning Right Heart Catheterization and Coronary Angiography Study and done today -See above and cardiology recommends continuing plans for TAVR evaluation and given that he had severe tortuosity of his right subclavian they recommended left cardiac catheter reattempt in the future and they will use a different approach  Stage 3 chronic kidney disease ([place])- (present on admission) -Stable.  -Creatinine 1.5 on admission which appears near patient's baseline. -Repeat BUNs/creatinine is 30/1.44 and is now 27/1.57 and currently getting IVF with NS post Cath -Avoid nephrotoxic medications, contrast dyes, hypotension and adjust medications -Repeat CMP in the AM   Hypertriglyceridemia- (present on admission) -Continue Ezetimibe 10 mg po qHS and Fenofibrate 160 mg po Daily  Type 2 diabetes mellitus with diabetic neuropathy, unspecified ([place])- (present on admission) -On admission blood sugar was noted to be 143. -Home insulin regimen includes Tresiba 30 units nightly and sliding scale of insulin and 0 to 10 units with meals. -C/w Hypoglycemic protocols -CBGs before every meal with sensitive sliding scale of insulin -Continue Gabapentin for Neuropathy -CBG's ranging from 123-192 -Resumed Tresiba substitution with Semglee 30 units Daily  Essential hypertension- (present on admission) -Blood pressures are currently maintained.  Home medication regimen includes Amlodipine 5 mg nightly, Lisinopril 40 mg q. Morning.   -  He reports that he was taken off of carvedilol last month due to it possibly causing dry mouth symptoms. -Continue current blood pressure regimen -Continue to Monitor BP per Protocol and Last BP reading was 133/74  Subjective: Seen and examined at bedside he is waiting for his cardiac cath.  Felt okay.  Denied any chest pain or shortness breath.  No nausea or vomiting.  No other concerns or complaints at this time.  Patient is to be discharged  home today however after his cath wife was concerned about him with the inability to bend his wrist so cardiologist told patient he can stay overnight and be discharged in the morning.  Physical Exam: Vitals:   12/10/21 1945 12/11/21 0509 12/11/21 0821 12/11/21 1051  BP: (!) 143/91 132/74 133/74   Pulse: 83 69    Resp:  17    Temp: 98 F (36.7 C) 98.1 F (36.7 C)    TempSrc: Oral Oral    SpO2: 97% 97%  97%  Weight:      Height:       Examination: Physical Exam:  Constitutional: WN/WD obese Caucasian in NAD and appears calm and comfortable Respiratory: Diminished to auscultation bilaterally, no wheezing, rales, rhonchi or crackles. Normal respiratory effort and patient is not tachypenic. No accessory muscle use. Unlabored breathing  Cardiovascular: RRR, no murmurs / rubs / gallops. S1 and S2 auscultated. Mild extremity edema.  Abdomen: Soft, non-tender, Distended 2/2 body habitus. Bowel sounds positive.  GU: Deferred. Musculoskeletal: No clubbing / cyanosis of digits/nails. No joint deformity upper and lower extremities.  Skin: No rashes, lesions, ulcers on a limited skin evaluation. No induration; Warm and dry.  Neurologic: CN 2-12 grossly intact with no focal deficits.  Psychiatric: Normal judgment and insight. Alert and oriented x 3. Normal mood and appropriate affect.   Data Reviewed:  I have independently reviewed the patient's CBC, CMP  Patient's BUNs/creatinine is elevated at 27/1.57 and now his hemoglobin/hematocrit is 9.8/30.5  Family Communication: Discussed with Wife at Bedside   Disposition: Status is: Observation The patient remains OBS appropriate and will be discharged in the morning  Planned Discharge Destination: Home with outpatient PT  DVT Prophylaxis: Heparin 5,000 units sq q8h  Author: Raiford Noble, DO [facility] 12/11/2021 3:06 PM  For on call review [email or web address].

## 2021-12-11 NOTE — Plan of Care (Signed)

## 2021-12-11 NOTE — Progress Notes (Addendum)
Progress Note  Patient Name: Tommy Green Date of Encounter: 12/11/2021  [facility] Cardiologist: Shirlee More, MD   Subjective   No acute events overnight.  Inpatient Medications    Scheduled Meds:  amLODipine  5 mg Oral QHS   enoxaparin (LOVENOX) injection  50 mg Subcutaneous Q24H   ezetimibe  10 mg Oral QHS   fenofibrate  160 mg Oral Daily   gabapentin  200 mg Oral QHS   guaiFENesin  600 mg Oral BID   HYDROcodone-acetaminophen  0.5 tablet Oral QHS   insulin aspart  0-9 Units Subcutaneous TID WC   insulin glargine-yfgn  30 Units Subcutaneous Q2200   leflunomide  10 mg Oral Q breakfast   lisinopril  40 mg Oral Daily   magnesium oxide  400 mg Oral Q lunch   melatonin  5 mg Oral QHS   pantoprazole  40 mg Oral Daily   sodium chloride flush  3 mL Intravenous Q12H   sodium chloride flush  3 mL Intravenous Q12H   Continuous Infusions:  sodium chloride     sodium chloride 1 mL/kg/hr (12/11/21 0605)   PRN Meds: sodium chloride, acetaminophen **OR** acetaminophen, albuterol, HYDROcodone-acetaminophen, ondansetron **OR** ondansetron (ZOFRAN) IV, sodium chloride flush   Vital Signs    Vitals:   12/10/21 1300 12/10/21 1357 12/10/21 1945 12/11/21 0509  BP: 128/72 131/79 (!) 143/91 132/74  Pulse: 83 81 83 69  Resp: 20 18  17   Temp: 98.6 F (37 C) 98.1 F (36.7 C) 98 F (36.7 C) 98.1 F (36.7 C)  TempSrc: Oral Oral Oral Oral  SpO2: 97% 95% 97% 97%  Weight:      Height:        Intake/Output Summary (Last 24 hours) at 12/11/2021 0813 Last data filed at 12/10/2021 2103 Gross per 24 hour  Intake 483 ml  Output --  Net 483 ml    Last 3 Weights 12/09/2021 12/09/2021 11/27/2021  Weight (lbs) 221 lb 11.2 oz 223 lb 8.7 oz 223 lb 8 oz  Weight (kg) 100.562 kg 101.4 kg 101.379 kg      Telemetry    NSR - Personally Reviewed  ECG    N/A  Physical Exam   GEN: No acute distress.   Neck: No JVD Cardiac: RRR, 3/6 systolic murmurs, rubs, or gallops.   Respiratory: Clear to auscultation bilaterally. GI: Soft, nontender, non-distended  MS: No edema; No deformity. Neuro:  Nonfocal  Psych: Normal affect   Labs    Chemistry Recent Labs  Lab 12/09/21 1058 12/10/21 0227 12/11/21 0250  NA 135 141 138  K 4.2 4.1 4.0  CL 103 108 108  CO2 22 23 21*  GLUCOSE 143* 113* 119*  BUN 33* 30* 27*  CREATININE 1.50* 1.44* 1.57*  CALCIUM 8.7* 9.2 9.2  MG  --   --  1.7  PROT  --   --  5.9*  ALBUMIN  --   --  3.2*  AST  --   --  17  ALT  --   --  12  ALKPHOS  --   --  29*  BILITOT  --   --  0.6  GFRNONAA 48* 50* 45*  ANIONGAP 10 10 9      Hematology Recent Labs  Lab 12/09/21 1058 12/10/21 0227 12/11/21 0250  WBC 8.0 8.9 8.5  RBC 3.79* 3.60* 3.83*  HGB 9.7* 9.3* 9.8*  HCT 30.8* 28.6* 30.5*  MCV 81.3 79.4* 79.6*  MCH 25.6* 25.8* 25.6*  MCHC 31.5 32.5  32.1  RDW 16.4* 16.2* 16.4*  PLT 276 264 295     Radiology    CT Head Wo Contrast  Result Date: 12/09/2021 CLINICAL DATA:  Frontal headache and dizziness EXAM: CT HEAD WITHOUT CONTRAST TECHNIQUE: Contiguous axial images were obtained from the base of the skull through the vertex without intravenous contrast. RADIATION DOSE REDUCTION: This exam was performed according to the departmental dose-optimization program which includes automated exposure control, adjustment of the mA and/or kV according to patient size and/or use of iterative reconstruction technique. COMPARISON:  CT head 03/05/2021 FINDINGS: Brain: Generalized atrophy. Chronic infarct left frontal lobe unchanged. Negative for acute infarct, hemorrhage, mass Vascular: Negative for hyperdense vessel Skull: Left parietal craniotomy. Craniotomy flap in good position. No acute skeletal abnormality Sinuses/Orbits: Mild mucosal edema paranasal sinuses. Air-fluid level right sphenoid sinus. Negative orbit Other: None IMPRESSION: Atrophy and chronic ischemic change.  No acute abnormality. Electronically Signed   By: Franchot Gallo M.D.    On: 12/09/2021 11:52    Cardiac Studies   Echo 10/21/21  1. Vertical orientation on PS views. Left ventricular ejection fraction,  by estimation, is 50 to 55%. The left ventricle has low normal function.  The left ventricle has no regional wall motion abnormalities. There is  mild concentric left ventricular  hypertrophy. Left ventricular diastolic parameters are consistent with  Grade I diastolic dysfunction (impaired relaxation).   2. Right ventricular systolic function is normal. The right ventricular  size is normal.   3. Left atrial size was moderately dilated.   4. The mitral valve is normal in structure. Trivial mitral valve  regurgitation. No evidence of mitral stenosis.   5. Normal SVI/Flow      . The aortic valve is tricuspid. There is severe calcifcation of the  aortic valve. There is moderate thickening of the aortic valve. Aortic  valve regurgitation is not visualized. Severe aortic valve stenosis.   6. There is mild dilatation of the ascending aorta, measuring 37 mm.   7. The inferior vena cava is normal in size with greater than 50%  respiratory variability, suggesting right atrial pressure of 3 mmHg.   Comparison(s): LVEF 55-60%, Severe AS.   Patient Profile     76 y.o. male with a hx of diabetes, hypertension, severe aortic stenosis, hyperlipidemia, prior stroke, skin cancer, iron deficiency anemia, obesity and CKD III who is being seen 12/09/2021 for the evaluation of severe aortic stenosis, presyncope at the request of Dr. Tamala Julian.   Assessment & Plan    Severe aortic stenosis:  No angina so any CAD seen on cath will be treated medically given results of Activation trial.  CT and CTS consult as outpatient.  Plan on discharge after cath and TR band wean.  I have reviewed the risks, indications, and alternatives to cardiac catheterization, possible angioplasty, and stenting with the patient. Risks include but are not limited to bleeding, infection, vascular injury,  stroke, myocardial infection, arrhythmia, kidney injury, radiation-related injury in the case of prolonged fluoroscopy use, emergency cardiac surgery, and death. The patient understands the risks of serious complication is 1-2 in 0175 with diagnostic cardiac cath and 1-2% or less with angioplasty/stenting.     For questions or updates, please contact [place] Please consult [email or web address] for contact info under        Signed, Early Osmond, MD  12/11/2021, 8:13 AM     Addendum:  Reviewed cath with patient and wife.  Plans in place for outpatient TAVR evaluation.  Cardiology will  sign off, call with ?s.  Lenna Sciara 2/11/21/21  13:42

## 2021-12-11 NOTE — H&P (View-Only) (Signed)
Progress Note  Patient Name: Tommy Green Date of Encounter: 12/11/2021  [facility] Cardiologist: Shirlee More, MD   Subjective   No acute events overnight.  Inpatient Medications    Scheduled Meds:  amLODipine  5 mg Oral QHS   enoxaparin (LOVENOX) injection  50 mg Subcutaneous Q24H   ezetimibe  10 mg Oral QHS   fenofibrate  160 mg Oral Daily   gabapentin  200 mg Oral QHS   guaiFENesin  600 mg Oral BID   HYDROcodone-acetaminophen  0.5 tablet Oral QHS   insulin aspart  0-9 Units Subcutaneous TID WC   insulin glargine-yfgn  30 Units Subcutaneous Q2200   leflunomide  10 mg Oral Q breakfast   lisinopril  40 mg Oral Daily   magnesium oxide  400 mg Oral Q lunch   melatonin  5 mg Oral QHS   pantoprazole  40 mg Oral Daily   sodium chloride flush  3 mL Intravenous Q12H   sodium chloride flush  3 mL Intravenous Q12H   Continuous Infusions:  sodium chloride     sodium chloride 1 mL/kg/hr (12/11/21 0605)   PRN Meds: sodium chloride, acetaminophen **OR** acetaminophen, albuterol, HYDROcodone-acetaminophen, ondansetron **OR** ondansetron (ZOFRAN) IV, sodium chloride flush   Vital Signs    Vitals:   12/10/21 1300 12/10/21 1357 12/10/21 1945 12/11/21 0509  BP: 128/72 131/79 (!) 143/91 132/74  Pulse: 83 81 83 69  Resp: 20 18  17   Temp: 98.6 F (37 C) 98.1 F (36.7 C) 98 F (36.7 C) 98.1 F (36.7 C)  TempSrc: Oral Oral Oral Oral  SpO2: 97% 95% 97% 97%  Weight:      Height:        Intake/Output Summary (Last 24 hours) at 12/11/2021 0813 Last data filed at 12/10/2021 2103 Gross per 24 hour  Intake 483 ml  Output --  Net 483 ml    Last 3 Weights 12/09/2021 12/09/2021 11/27/2021  Weight (lbs) 221 lb 11.2 oz 223 lb 8.7 oz 223 lb 8 oz  Weight (kg) 100.562 kg 101.4 kg 101.379 kg      Telemetry    NSR - Personally Reviewed  ECG    N/A  Physical Exam   GEN: No acute distress.   Neck: No JVD Cardiac: RRR, 3/6 systolic murmurs, rubs, or gallops.   Respiratory: Clear to auscultation bilaterally. GI: Soft, nontender, non-distended  MS: No edema; No deformity. Neuro:  Nonfocal  Psych: Normal affect   Labs    Chemistry Recent Labs  Lab 12/09/21 1058 12/10/21 0227 12/11/21 0250  NA 135 141 138  K 4.2 4.1 4.0  CL 103 108 108  CO2 22 23 21*  GLUCOSE 143* 113* 119*  BUN 33* 30* 27*  CREATININE 1.50* 1.44* 1.57*  CALCIUM 8.7* 9.2 9.2  MG  --   --  1.7  PROT  --   --  5.9*  ALBUMIN  --   --  3.2*  AST  --   --  17  ALT  --   --  12  ALKPHOS  --   --  29*  BILITOT  --   --  0.6  GFRNONAA 48* 50* 45*  ANIONGAP 10 10 9      Hematology Recent Labs  Lab 12/09/21 1058 12/10/21 0227 12/11/21 0250  WBC 8.0 8.9 8.5  RBC 3.79* 3.60* 3.83*  HGB 9.7* 9.3* 9.8*  HCT 30.8* 28.6* 30.5*  MCV 81.3 79.4* 79.6*  MCH 25.6* 25.8* 25.6*  MCHC 31.5 32.5  32.1  RDW 16.4* 16.2* 16.4*  PLT 276 264 295     Radiology    CT Head Wo Contrast  Result Date: 12/09/2021 CLINICAL DATA:  Frontal headache and dizziness EXAM: CT HEAD WITHOUT CONTRAST TECHNIQUE: Contiguous axial images were obtained from the base of the skull through the vertex without intravenous contrast. RADIATION DOSE REDUCTION: This exam was performed according to the departmental dose-optimization program which includes automated exposure control, adjustment of the mA and/or kV according to patient size and/or use of iterative reconstruction technique. COMPARISON:  CT head 03/05/2021 FINDINGS: Brain: Generalized atrophy. Chronic infarct left frontal lobe unchanged. Negative for acute infarct, hemorrhage, mass Vascular: Negative for hyperdense vessel Skull: Left parietal craniotomy. Craniotomy flap in good position. No acute skeletal abnormality Sinuses/Orbits: Mild mucosal edema paranasal sinuses. Air-fluid level right sphenoid sinus. Negative orbit Other: None IMPRESSION: Atrophy and chronic ischemic change.  No acute abnormality. Electronically Signed   By: Franchot Gallo M.D.    On: 12/09/2021 11:52    Cardiac Studies   Echo 10/21/21  1. Vertical orientation on PS views. Left ventricular ejection fraction,  by estimation, is 50 to 55%. The left ventricle has low normal function.  The left ventricle has no regional wall motion abnormalities. There is  mild concentric left ventricular  hypertrophy. Left ventricular diastolic parameters are consistent with  Grade I diastolic dysfunction (impaired relaxation).   2. Right ventricular systolic function is normal. The right ventricular  size is normal.   3. Left atrial size was moderately dilated.   4. The mitral valve is normal in structure. Trivial mitral valve  regurgitation. No evidence of mitral stenosis.   5. Normal SVI/Flow      . The aortic valve is tricuspid. There is severe calcifcation of the  aortic valve. There is moderate thickening of the aortic valve. Aortic  valve regurgitation is not visualized. Severe aortic valve stenosis.   6. There is mild dilatation of the ascending aorta, measuring 37 mm.   7. The inferior vena cava is normal in size with greater than 50%  respiratory variability, suggesting right atrial pressure of 3 mmHg.   Comparison(s): LVEF 55-60%, Severe AS.   Patient Profile     76 y.o. male with a hx of diabetes, hypertension, severe aortic stenosis, hyperlipidemia, prior stroke, skin cancer, iron deficiency anemia, obesity and CKD III who is being seen 12/09/2021 for the evaluation of severe aortic stenosis, presyncope at the request of Dr. Tamala Julian.   Assessment & Plan    Severe aortic stenosis:  No angina so any CAD seen on cath will be treated medically given results of Activation trial.  CT and CTS consult as outpatient.  Plan on discharge after cath and TR band wean.  I have reviewed the risks, indications, and alternatives to cardiac catheterization, possible angioplasty, and stenting with the patient. Risks include but are not limited to bleeding, infection, vascular injury,  stroke, myocardial infection, arrhythmia, kidney injury, radiation-related injury in the case of prolonged fluoroscopy use, emergency cardiac surgery, and death. The patient understands the risks of serious complication is 1-2 in 5035 with diagnostic cardiac cath and 1-2% or less with angioplasty/stenting.     For questions or updates, please contact [place] Please consult [email or web address] for contact info under        Signed, Early Osmond, MD  12/11/2021, 8:13 AM

## 2021-12-12 DIAGNOSIS — Z794 Long term (current) use of insulin: Secondary | ICD-10-CM | POA: Diagnosis not present

## 2021-12-12 DIAGNOSIS — R55 Syncope and collapse: Secondary | ICD-10-CM | POA: Diagnosis not present

## 2021-12-12 DIAGNOSIS — E1140 Type 2 diabetes mellitus with diabetic neuropathy, unspecified: Secondary | ICD-10-CM | POA: Diagnosis not present

## 2021-12-12 DIAGNOSIS — I350 Nonrheumatic aortic (valve) stenosis: Secondary | ICD-10-CM | POA: Diagnosis not present

## 2021-12-12 DIAGNOSIS — I10 Essential (primary) hypertension: Secondary | ICD-10-CM | POA: Diagnosis not present

## 2021-12-12 LAB — COMPREHENSIVE METABOLIC PANEL
ALT: 14 U/L (ref 0–44)
AST: 21 U/L (ref 15–41)
Albumin: 3.2 g/dL — ABNORMAL LOW (ref 3.5–5.0)
Alkaline Phosphatase: 27 U/L — ABNORMAL LOW (ref 38–126)
Anion gap: 8 (ref 5–15)
BUN: 25 mg/dL — ABNORMAL HIGH (ref 8–23)
CO2: 23 mmol/L (ref 22–32)
Calcium: 9.1 mg/dL (ref 8.9–10.3)
Chloride: 110 mmol/L (ref 98–111)
Creatinine, Ser: 1.61 mg/dL — ABNORMAL HIGH (ref 0.61–1.24)
GFR, Estimated: 44 mL/min — ABNORMAL LOW (ref >60)
Glucose, Bld: 64 mg/dL — ABNORMAL LOW (ref 70–99)
Potassium: 3.8 mmol/L (ref 3.5–5.1)
Sodium: 141 mmol/L (ref 135–145)
Total Bilirubin: 0.4 mg/dL (ref 0.3–1.2)
Total Protein: 6 g/dL — ABNORMAL LOW (ref 6.5–8.1)

## 2021-12-12 LAB — PHOSPHORUS: Phosphorus: 3.8 mg/dL (ref 2.5–4.6)

## 2021-12-12 LAB — CBC WITH DIFFERENTIAL/PLATELET
Abs Immature Granulocytes: 0.03 10*3/uL (ref 0.00–0.07)
Basophils Absolute: 0.1 10*3/uL (ref 0.0–0.1)
Basophils Relative: 1 %
Eosinophils Absolute: 0.3 10*3/uL (ref 0.0–0.5)
Eosinophils Relative: 3 %
HCT: 30.5 % — ABNORMAL LOW (ref 39.0–52.0)
Hemoglobin: 9.6 g/dL — ABNORMAL LOW (ref 13.0–17.0)
Immature Granulocytes: 0 %
Lymphocytes Relative: 30 %
Lymphs Abs: 2.6 10*3/uL (ref 0.7–4.0)
MCH: 25.5 pg — ABNORMAL LOW (ref 26.0–34.0)
MCHC: 31.5 g/dL (ref 30.0–36.0)
MCV: 81.1 fL (ref 80.0–100.0)
Monocytes Absolute: 0.8 10*3/uL (ref 0.1–1.0)
Monocytes Relative: 9 %
Neutro Abs: 4.9 10*3/uL (ref 1.7–7.7)
Neutrophils Relative %: 57 %
Platelets: 288 10*3/uL (ref 150–400)
RBC: 3.76 MIL/uL — ABNORMAL LOW (ref 4.22–5.81)
RDW: 16.5 % — ABNORMAL HIGH (ref 11.5–15.5)
WBC: 8.7 10*3/uL (ref 4.0–10.5)
nRBC: 0 % (ref 0.0–0.2)

## 2021-12-12 LAB — GLUCOSE, CAPILLARY
Glucose-Capillary: 132 mg/dL — ABNORMAL HIGH (ref 70–99)
Glucose-Capillary: 141 mg/dL — ABNORMAL HIGH (ref 70–99)

## 2021-12-12 LAB — MAGNESIUM: Magnesium: 1.7 mg/dL (ref 1.7–2.4)

## 2021-12-12 MED ORDER — MAGNESIUM SULFATE 2 GM/50ML IV SOLN
2.0000 g | Freq: Once | INTRAVENOUS | Status: AC
  Administered 2021-12-12: 2 g via INTRAVENOUS
  Filled 2021-12-12: qty 50

## 2021-12-12 NOTE — Discharge Summary (Signed)
Physician Discharge Summary   Patient: Tommy Green MRN: [id number] DOB: 1946-02-03  Admit date:     12/09/2021  Discharge date: 12/12/21  Discharge Physician: Raiford Noble, DO   PCP: [name], DO   Recommendations at discharge:   Follow-up with Cardiology in outpatient setting for completion of TAVR work-up and patient has a CT scan scheduled for Friday, March 3 Follow-up with PCP within 1 to 2 weeks and repeat CBC, CMP, mag, Phos within 1 to 2 weeks  Discharge Diagnoses: Principal Problem:   Near syncope Active Problems:   Essential hypertension   Type 2 diabetes mellitus with diabetic neuropathy, unspecified (HCC)   Hypertriglyceridemia   Stage 3 chronic kidney disease (HCC)   Foot drop, left foot   Aortic stenosis   Rheumatoid arthritis (HCC)   Microcytic anemia   Obesity (BMI 30-39.9)  Resolved Problems:   * No resolved hospital problems. [facility] Course: The patient is a 76 year old obese Caucasian male with a past medical history significant for but limited to hypertension, hypertriglyceridemia, severe aortic stenosis, history of subdural hemorrhage, diabetes mellitus type 2, RA, history of anemia, history of skin cancer and history of left foot drop as a result of a complication of prior back surgery in 01/2021 who presented with complaints of dizziness.  Patient reports that he is not feeling well for couple weeks now upon awakening yesterday morning he felt dizzy described as he could have passed out.  He denies symptoms of vertigo but he has been having sinus congestion with postnasal drip.  At home his wife is given Dramamine with some mild improvement in symptoms.  He chronically has lower extremity swelling that has not worsened and he denies any chest pain, palpitations, shortness breath or loss of consciousness.  Recent echocardiogram was done in January 2023 which noted to have an EF of 50 to 55% with grade 1 diastolic dysfunction as well as  severe aortic stenosis and severely calcified aortic valve.  He is being followed by cardiology in outpatient setting and they had recommended possible TAVR.  Complications during his prior back surgery where he needed to be placed in ICU on pressors.  He is presented to the ED and he is afebrile with blood pressures in the 062B to 762G systolic.  CT scan of the head noted atrophy and chronic ischemic changes.  Labs noted hemoglobin 9.7 and BUN was elevated at 33 and creatinine of 1.5.  COVID testing and influenza testing were negative.  Cardiology was consulted for further evaluation recommendations given his near syncope with question about whether this was related to his aortic stenosis or not.  Orthostatic vital signs were checked and patient was placed on telemetry and PT evaluated and recommended outpatient PT.  Cardiology evaluated for his dizziness and near syncope and they felt that his presenting symptoms could be a form of sinus congestion versus aortic stenosis and they had a long conversation about his aortic stenosis and they are going to do a right heart cath with coronary angiography study in the morning with plans for outpatient CT Scan and Cardiothoracic Surgery Consultation.   Patient underwent his cardiac catheterization which and showed moderate nonobstructive coronary disease and the recommendation was to continue with plans for TAVR evaluation.  Cardiology recommended gentle post-cath hydration due to his renal insufficiency.  Cardiology discussed his findings on his cath with the patient.  Patient was to be discharged home after his cardiac cath and his TR band weaning but  family was concerned that if the patient discharged yesterday he would not be able to use walker because he cannot bend his wrist and apply pressure.  Patient's wife felt that she could not help get him in the house and rather him stay the night so cardiology states the patient will stay overnight and discharge in the  morning.    This morning the patient is stable to be discharged home and he did not have orthostatic hypotension.  He is stable for discharge and will follow up with PCP and cardiology and continue to have outpatient work up for his TAVR.  Assessment and Plan: * Near syncope- (present on admission) -Patient presents with complaints of dizziness and states that he felt like he could have passed out after standing this morning.   -Family question if symptoms could be secondary to patient's aortic stenosis for which EMS was called.   -CT imaging of the brain did not note any acute abnormality. -Repeat Orthostatic in the AM  -Will order TED Hose and if necessary Abdominal Binder -Continue Telemetry Monitoring   -PT Evaluating and recommending Outpatient PT -Appreciate Cardiology consultative services and they are planning a RHC and Coronary Angiography Study tomorrow with outpatient CT Scan and Cardiothoracic Surgery Consultation -[place] cath done and showed "  Ost LAD to Prox LAD lesion is 50% stenosed.   Mid LAD lesion is 50% stenosed.   Mid RCA lesion is 50% stenosed.   Aortic saturation 98%, PA saturation 65%, PA pressure 19/6, mean PA pressure 11 mmHg, mean pulmonary capillary wedge pressure 9 mmHg, cardiac output 6.6 L/min, cardiac index 3.0.   Severe right subclavian tortuosity.  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   Moderate, nonobstructive coronary artery disease.  Continue with plans for TAVR evaluation.  Plan discussed with Dr. Ali Lowe.   Severe tortuosity of the right subclavian.  If cardiac cath was attempted in the future, would use a different approach.  If right radial had to be used, would need an 85 cm destination sheath.  75 cm sheath was too short.   Gentle post-cath hydration due to renal insufficiency. -Repeat Orthostatic VS and he did not feel dizzy.  Able to use his wrist and will discharge home now    Obesity (BMI 60-10.9) -Complicates overall prognosis and care -Estimated body  mass index is 30.92 kg/m as calculated from the following:   Height as of this encounter: 5\' 11"  (1.803 m).   Weight as of this encounter: 100.6 kg.  -Weight Loss and Dietary Counseling given   Microcytic anemia -Patient's Hgb/Hct has gone from 10.6/33.2 -> 9.7/30.8 -> 9.3/79.4 -> 9.9/29.0 and is now 9.6 -Has a Hx IDA -Check Anemia Panel in the outpatient setting -Continue to Monitor for S/Sx of Bleeding; No overt bleeding noted -Repeat CBC in the AM   Rheumatoid arthritis ([place])- (present on admission) -Patient has Rheumatoid arthritis involving multiple joints with positive rheumatoid factor.  -Patient is on Arava milligrams daily and Enbrel injections q. Weekly which is currently being held  Aortic stenosis -Patient noted to have severe aortic stenosis by last echocardiogram.   -Currently being followed by cardiology for possible need of TAVR. -Cardiology consulted and will continue to engage patient in regards to further work-up and planning Right Heart Catheterization and Coronary Angiography Study and done today -See above and cardiology recommends continuing plans for TAVR evaluation and given that he had severe tortuosity of his right subclavian they recommended left cardiac catheter reattempt in the future and they will use a different  approach -Further Workup in the outpatient Setting   Stage 3 chronic kidney disease ([place])- (present on admission) -Stable.  -Creatinine 1.5 on admission which appears near patient's baseline. -Repeat BUNs/creatinine is 30/1.44 -> 27/1.57 -> 25/1.61 and received IVF with NS post Cath -Avoid nephrotoxic medications, contrast dyes, hypotension and adjust medications -Repeat CMP in the AM   Hypertriglyceridemia- (present on admission) -Continue Ezetimibe 10 mg po qHS and Fenofibrate 160 mg po Daily  Type 2 diabetes mellitus with diabetic neuropathy, unspecified ([place])- (present on admission) -On admission blood sugar was noted to be 143. -Home  insulin regimen includes Tresiba 30 units nightly and sliding scale of insulin and 0 to 10 units with meals. -C/w Hypoglycemic protocols -CBGs before every meal with sensitive sliding scale of insulin -Continue Gabapentin for Neuropathy -CBG's ranging from 126-170 -Resumed Tresiba substitution with Semglee 30 units Daily and continue to monitor in outpatient setting  Essential hypertension- (present on admission) -Blood pressures are currently maintained.  Home medication regimen includes Amlodipine 5 mg nightly, Lisinopril 40 mg q. Morning.   -He reports that he was taken off of carvedilol last month due to it possibly causing dry mouth symptoms. -Continue current blood pressure regimen -Continue to Monitor BP per Protocol and Last BP reading was 122/76  Pain control - [facility] Controlled Substance Reporting System database was reviewed. and patient was instructed, not to drive, operate heavy machinery, perform activities at heights, swimming or participation in water activities or provide baby-sitting services while on Pain, Sleep and Anxiety Medications; until their outpatient Physician has advised to do so again. Also recommended to not to take more than prescribed Pain, Sleep and Anxiety Medications.   Consultants: Cardiology Procedures performed: Cardiac Cath  Disposition: Home Diet recommendation:  Cardiac diet  DISCHARGE MEDICATION: Allergies as of 12/12/2021       Reactions   Fluzone Quadrivalent [influenza Vac Split Quad] Other (See Comments)   "Got the flu"   Influenza Vaccines Other (See Comments)   "Got the flu"   Aspirin Other (See Comments)   Gastroesophageal reflux and "allergic," per [facility]   Atorvastatin Other (See Comments)   Myalgias, GI upset, and "allergic," per [facility]   Canagliflozin Other (See Comments)   Pt reports that the use of Invokana caused acute KIDNEY FAILURE ([facility] records) and "allergic," per [facility]   Statins Other (See Comments)   Muscle pain-  "allergic," per [facility]        Medication List     STOP taking these medications    hydrochlorothiazide 25 MG tablet Commonly known as: HYDRODIURIL       TAKE these medications    acetaminophen 325 MG tablet Commonly known as: TYLENOL Take 650 mg by mouth every 6 (six) hours as needed for mild pain or headache.   amLODipine 5 MG tablet Commonly known as: NORVASC Take 1 tablet (5 mg total) by mouth daily. What changed: when to take this   aspirin EC 81 MG tablet Take 162 mg by mouth every 6 (six) hours as needed for mild pain (or headaches- Swallow whole).   B COMPLEX PO Take 1 tablet by mouth daily.   Enbrel Mini 50 MG/ML Soct Generic drug: Etanercept Inject 50 mg into the skin once a week. What changed: when to take this   ezetimibe 10 MG tablet Commonly known as: Zetia Take 1 tablet (10 mg total) by mouth daily. For elevated cholesterol and triglycerides What changed: when to take this   fenofibrate micronized 200 MG capsule Commonly  known as: LOFIBRA TAKE 1 CAPSULE DAILY BEFOREBREAKFAST What changed: See the new instructions.   gabapentin 100 MG capsule Commonly known as: NEURONTIN TAKE 2 CAPSULES AT BEDTIME   HYDROcodone-acetaminophen 10-325 MG tablet Commonly known as: NORCO Take 0.5 tablets by mouth See admin instructions. Take 0.5 tablet by mouth at bedtime and an additional 0.5 tablet once a day as needed for pain   ibuprofen 200 MG tablet Commonly known as: ADVIL Take 600 mg by mouth every 6 (six) hours as needed for mild pain or headache.   insulin degludec 100 UNIT/ML FlexTouch Pen Commonly known as: TRESIBA Inject 30 Units into the skin at bedtime.   leflunomide 10 MG tablet Commonly known as: ARAVA TAKE 1 TABLET BY MOUTH EVERY DAY What changed: when to take this   lisinopril 40 MG tablet Commonly known as: ZESTRIL Take 40 mg by mouth in the morning.   magnesium oxide 400 MG tablet Commonly known as: MAG-OX Take 400 mg by mouth daily  with lunch.   melatonin 5 MG Tabs Take 5 mg by mouth at bedtime.   multivitamin with minerals tablet Take 1 tablet by mouth daily with breakfast.   NovoLOG FlexPen 100 UNIT/ML FlexPen Generic drug: insulin aspart Inject 0-10 Units into the skin See admin instructions. Inject 0-10 units into the skin before meals, PER SLIDING SCALE: BGL 0-200 = give nothing, 201-250 = 2 units, 251-300 = 4 units, 301-350 = 6 units, 351-400 = 8 units, >400 = 10 units, push fluids, and repeat BGL check in 2 hours. If BGL remains >400 after 2 hours, CALL MD.   Omega 3 1000 MG Caps Take 1,000 mg by mouth daily with lunch.   omeprazole 20 MG capsule Commonly known as: PRILOSEC Take 1 capsule (20 mg total) by mouth daily before breakfast.   OneTouch Verio test strip Generic drug: glucose blood 4 (four) times daily.   Potassium 99 MG Tabs Take 99 mg by mouth every evening.   vitamin B-12 1000 MCG tablet Commonly known as: CYANOCOBALAMIN Take 1,000 mcg by mouth daily.   Vitamin D3 Super Strength 50 MCG (2000 UT) Caps Generic drug: Cholecalciferol Take 2,000 Units by mouth daily.   vitamin E 180 MG (400 UNITS) capsule Take 400 Units by mouth daily.        Follow-up Information     Early Osmond, MD Follow up.   Specialty: Cardiology Why: continue outpatient work up for [facility] information: [address] [place] [address] [phone]                 Discharge Exam: Danley Danker Weights   12/09/21 1033 12/09/21 1640  Weight: 101.4 kg 100.6 kg   Vitals:   12/12/21 0643 12/12/21 0850  BP: 126/75 122/76  Pulse: 79   Resp:    Temp: 97.9 F (36.6 C)   SpO2: 95%    Examination: Physical Exam:  Constitutional: WN/WD obese Caucasian male in NAD and appears calm and comfortable Respiratory: Diminished to auscultation bilaterally, no wheezing, rales, rhonchi or crackles. Normal respiratory effort and patient is not tachypenic. No accessory muscle use.   Cardiovascular: RRR, Has  4/6 Systolic Murmur. Trace edema  Abdomen: Soft, non-tender, Distended 2/2 body habitus.  Bowel sounds positive.  GU: Deferred  Condition at discharge: stable  The results of significant diagnostics from this hospitalization (including imaging, microbiology, ancillary and laboratory) are listed below for reference.   Imaging Studies: CT Head Wo Contrast  Result Date: 12/09/2021 CLINICAL  DATA:  Frontal headache and dizziness EXAM: CT HEAD WITHOUT CONTRAST TECHNIQUE: Contiguous axial images were obtained from the base of the skull through the vertex without intravenous contrast. RADIATION DOSE REDUCTION: This exam was performed according to the departmental dose-optimization program which includes automated exposure control, adjustment of the mA and/or kV according to patient size and/or use of iterative reconstruction technique. COMPARISON:  CT head 03/05/2021 FINDINGS: Brain: Generalized atrophy. Chronic infarct left frontal lobe unchanged. Negative for acute infarct, hemorrhage, mass Vascular: Negative for hyperdense vessel Skull: Left parietal craniotomy. Craniotomy flap in good position. No acute skeletal abnormality Sinuses/Orbits: Mild mucosal edema paranasal sinuses. Air-fluid level right sphenoid sinus. Negative orbit Other: None IMPRESSION: Atrophy and chronic ischemic change.  No acute abnormality. Electronically Signed   By: Franchot Gallo M.D.   On: 12/09/2021 11:52   CARDIAC CATHETERIZATION  Result Date: 12/11/2021   Ost LAD to Prox LAD lesion is 50% stenosed.   Mid LAD lesion is 50% stenosed.   Mid RCA lesion is 50% stenosed.   Aortic saturation 98%, PA saturation 65%, PA pressure 19/6, mean PA pressure 11 mmHg, mean pulmonary capillary wedge pressure 9 mmHg, cardiac output 6.6 L/min, cardiac index 3.0.   Severe right subclavian tortuosity.  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 Moderate, nonobstructive coronary artery disease.  Continue with plans for TAVR evaluation.  Plan  discussed with Dr. Ali Lowe. Severe tortuosity of the right subclavian.  If cardiac cath was attempted in the future, would use a different approach.  If right radial had to be used, would need an 85 cm destination sheath.  75 cm sheath was too short. Gentle post-cath hydration due to renal insufficiency.    Microbiology: Results for orders placed or performed during the hospital encounter of 12/09/21  Resp Panel by RT-PCR (Flu A&B, Covid) Nasopharyngeal Swab     Status: None   Collection Time: 12/09/21 11:10 AM   Specimen: Nasopharyngeal Swab; Nasopharyngeal(NP) swabs in vial transport medium  Result Value Ref Range Status   SARS Coronavirus 2 by RT PCR NEGATIVE NEGATIVE Final    Comment: (NOTE) SARS-CoV-2 target nucleic acids are NOT DETECTED.  The SARS-CoV-2 RNA is generally detectable in upper respiratory specimens during the acute phase of infection. The lowest concentration of SARS-CoV-2 viral copies this assay can detect is 138 copies/mL. A negative result does not preclude SARS-Cov-2 infection and should not be used as the sole basis for treatment or other patient management decisions. A negative result may occur with  improper specimen collection/handling, submission of specimen other than nasopharyngeal swab, presence of viral mutation(s) within the areas targeted by this assay, and inadequate number of viral copies(<138 copies/mL). A negative result must be combined with clinical observations, patient history, and epidemiological information. The expected result is Negative.  Fact Sheet for Patients:  EntrepreneurPulse.com.au  Fact Sheet for Healthcare Providers:  IncredibleEmployment.be  This test is no t yet approved or cleared by the [place] FDA and  has been authorized for detection and/or diagnosis of SARS-CoV-2 by FDA under an Emergency Use Authorization (EUA). This EUA will remain  in effect (meaning this test can be used)  for the duration of the COVID-19 declaration under Section 564(b)(1) of the Act, 21 U.S.C.section 360bbb-3(b)(1), unless the authorization is terminated  or revoked sooner.       Influenza A by PCR NEGATIVE NEGATIVE Final   Influenza B by PCR NEGATIVE NEGATIVE Final    Comment: (NOTE) The Xpert Xpress SARS-CoV-2/FLU/RSV plus assay is intended as an aid in the  diagnosis of influenza from Nasopharyngeal swab specimens and should not be used as a sole basis for treatment. Nasal washings and aspirates are unacceptable for Xpert Xpress SARS-CoV-2/FLU/RSV testing.  Fact Sheet for Patients: EntrepreneurPulse.com.au  Fact Sheet for Healthcare Providers: IncredibleEmployment.be  This test is not yet approved or cleared by the [place] FDA and has been authorized for detection and/or diagnosis of SARS-CoV-2 by FDA under an Emergency Use Authorization (EUA). This EUA will remain in effect (meaning this test can be used) for the duration of the COVID-19 declaration under Section 564(b)(1) of the Act, 21 U.S.C. section 360bbb-3(b)(1), unless the authorization is terminated or revoked.  Performed at [facility], [place] [address], [place], [place] [address]     Labs: CBC: Recent Labs  Lab 12/09/21 1058 12/10/21 0227 12/11/21 0250 12/11/21 1111 12/11/21 1119 12/12/21 0342  WBC 8.0 8.9 8.5  --   --  8.7  NEUTROABS  --   --  4.2  --   --  4.9  HGB 9.7* 9.3* 9.8* 9.9* 9.9*   9.9* 9.6*  HCT 30.8* 28.6* 30.5* 29.0* 29.0*   29.0* 30.5*  MCV 81.3 79.4* 79.6*  --   --  81.1  PLT 276 264 295  --   --  782   Basic Metabolic Panel: Recent Labs  Lab 12/09/21 1058 12/10/21 0227 12/11/21 0250 12/11/21 1111 12/11/21 1119 12/12/21 0342  NA 135 141 138 141 141   141 141  K 4.2 4.1 4.0 4.0 4.2   4.2 3.8  CL 103 108 108  --   --  110  CO2 22 23 21*  --   --  23  GLUCOSE 143* 113* 119*  --   --  64*  BUN 33* 30* 27*  --   --  25*  CREATININE  1.50* 1.44* 1.57*  --   --  1.61*  CALCIUM 8.7* 9.2 9.2  --   --  9.1  MG  --   --  1.7  --   --  1.7  PHOS  --   --  4.1  --   --  3.8   Liver Function Tests: Recent Labs  Lab 12/11/21 0250 12/12/21 0342  AST 17 21  ALT 12 14  ALKPHOS 29* 27*  BILITOT 0.6 0.4  PROT 5.9* 6.0*  ALBUMIN 3.2* 3.2*   CBG: Recent Labs  Lab 12/11/21 1219 12/11/21 1628 12/11/21 2222 12/12/21 0814 12/12/21 1143  GLUCAP 137* 170* 126* 132* 141*    Discharge time spent: greater than 30 minutes.  Signed: Raiford Noble, DO [facility] 12/13/2021

## 2021-12-12 NOTE — TOC Transition Note (Signed)
Transition of Care [facility]) - CM/SW Discharge Note   Patient Details  Name: Tommy Green MRN: [id number] Date of Birth: 26-Nov-1945  [facility] CM/SW Contact:  Zenon Mayo, RN Phone Number: 12/12/2021, 10:37 AM   Clinical Narrative:    Patient for dc today, outpatient pt set up by previous NCM.  He has no other needs.         Patient Goals and CMS Choice        Discharge Placement                       Discharge Plan and Services                                     Social Determinants of Health (SDOH) Interventions     Readmission Risk Interventions No flowsheet data found.

## 2021-12-12 NOTE — Plan of Care (Signed)

## 2021-12-14 ENCOUNTER — Telehealth: Payer: Self-pay

## 2021-12-14 MED FILL — Lidocaine HCl Local Preservative Free (PF) Inj 1%: INTRAMUSCULAR | Qty: 30 | Status: AC

## 2021-12-14 NOTE — Telephone Encounter (Signed)
Transition Care Management Follow-up Telephone Call Date of discharge and from where: 12/12/2021-[place] How have you been since you were released from the hospital? Tired but slowly recovering Any questions or concerns? No  Items Reviewed: Did the pt receive and understand the discharge instructions provided? Yes  Medications obtained and verified? Yes  Other? Yes  Any new allergies since your discharge? No  Dietary orders reviewed? Yes Do you have support at home? Yes   Home Care and Equipment/Supplies: Were home health services ordered? no If so, what is the name of the agency? N/a  Has the agency set up a time to come to the patient's home? not applicable Were any new equipment or medical supplies ordered?  No What is the name of the medical supply agency? N/a Were you able to get the supplies/equipment? not applicable Do you have any questions related to the use of the equipment or supplies? N/a  Functional Questionnaire: (I = Independent and D = Dependent) ADLs: I  Bathing/Dressing- I  Meal Prep- I  Eating- I  Maintaining continence- I  Transferring/Ambulation- I  Managing Meds- I  Follow up appointments reviewed:  PCP Hospital f/u appt confirmed? Yes  Scheduled to see Dr Nani Ravens on 12/21/2021 @ 11:15. [facility] f/u appt confirmed? No  Patient to schedule with cardiolgy after his procedure on Friday. Are transportation arrangements needed? No  If their condition worsens, is the pt aware to call PCP or go to the Emergency Dept.? Yes Was the patient provided with contact information for the PCP's office or ED? Yes Was to pt encouraged to call back with questions or concerns? Yes

## 2021-12-15 ENCOUNTER — Ambulatory Visit: Payer: Medicare HMO

## 2021-12-15 DIAGNOSIS — E785 Hyperlipidemia, unspecified: Secondary | ICD-10-CM | POA: Diagnosis not present

## 2021-12-15 DIAGNOSIS — E1122 Type 2 diabetes mellitus with diabetic chronic kidney disease: Secondary | ICD-10-CM | POA: Diagnosis not present

## 2021-12-15 DIAGNOSIS — Z794 Long term (current) use of insulin: Secondary | ICD-10-CM

## 2021-12-15 DIAGNOSIS — I129 Hypertensive chronic kidney disease with stage 1 through stage 4 chronic kidney disease, or unspecified chronic kidney disease: Secondary | ICD-10-CM

## 2021-12-15 DIAGNOSIS — N1831 Chronic kidney disease, stage 3a: Secondary | ICD-10-CM

## 2021-12-16 ENCOUNTER — Other Ambulatory Visit: Payer: Self-pay | Admitting: Rheumatology

## 2021-12-16 ENCOUNTER — Other Ambulatory Visit: Payer: Self-pay | Admitting: Physician Assistant

## 2021-12-16 DIAGNOSIS — M0579 Rheumatoid arthritis with rheumatoid factor of multiple sites without organ or systems involvement: Secondary | ICD-10-CM

## 2021-12-16 MED ORDER — METOPROLOL TARTRATE 50 MG PO TABS: 50.0000 mg | ORAL_TABLET | Freq: Once | ORAL | 0 refills | Status: DC

## 2021-12-16 NOTE — Telephone Encounter (Signed)
Please schedule patient a follow up visit. Patient due June 2023. Thanks!  

## 2021-12-16 NOTE — Telephone Encounter (Signed)
Next Visit: Due June 2023. Message sent to the front to schedule patient.  ? ?Last Visit: 11/17/2021 ? ?Last Fill: 08/14/2021 ? ?DX: Rheumatoid arthritis involving multiple sites with positive rheumatoid factor  ? ?Current Dose per office note 11/17/2021: Arava 10 mg 1 tablet by mouth daily  ? ?Labs: 12/12/2021 Glucose 64, BUN 25, Creat. 1.61, GFR 44,Total Protein 6.0, Albumin 3.2, Alk. Phos 27, RBC 3.76, Hgb 9.6, Hct 30.5, MCH 25.5, RDW 16.5 ? ?Okay to refill Arava?  ?

## 2021-12-17 DIAGNOSIS — L821 Other seborrheic keratosis: Secondary | ICD-10-CM | POA: Diagnosis not present

## 2021-12-17 DIAGNOSIS — M6740 Ganglion, unspecified site: Secondary | ICD-10-CM | POA: Diagnosis not present

## 2021-12-17 DIAGNOSIS — D0439 Carcinoma in situ of skin of other parts of face: Secondary | ICD-10-CM | POA: Diagnosis not present

## 2021-12-17 DIAGNOSIS — Z859 Personal history of malignant neoplasm, unspecified: Secondary | ICD-10-CM | POA: Diagnosis not present

## 2021-12-18 ENCOUNTER — Ambulatory Visit (HOSPITAL_COMMUNITY)
Admission: RE | Admit: 2021-12-18 | Discharge: 2021-12-18 | Disposition: A | Discharge status: Home / Self Care | Payer: Medicare HMO | Source: Ambulatory Visit | Attending: Physician Assistant | Admitting: Physician Assistant

## 2021-12-18 ENCOUNTER — Other Ambulatory Visit: Payer: Self-pay

## 2021-12-18 DIAGNOSIS — I350 Nonrheumatic aortic (valve) stenosis: Secondary | ICD-10-CM | POA: Insufficient documentation

## 2021-12-18 IMAGING — CT CT ANGIO CHEST
2 of 7 series · 17 of 36 positions shown · IV contrast (APPLIED)
Comparison: None.

CLINICAL DATA: Preop evaluation for aortic valve replacement

EXAM:
CT ANGIOGRAPHY CHEST, ABDOMEN AND PELVIS
TECHNIQUE: Non-contrast CT of the chest was initially obtained.

[Series 1: ax thins · axial · 0.59mm/px · z∈[+786,+1400]mm · 16 of 692 slices shown]
[im 39/692  lung]
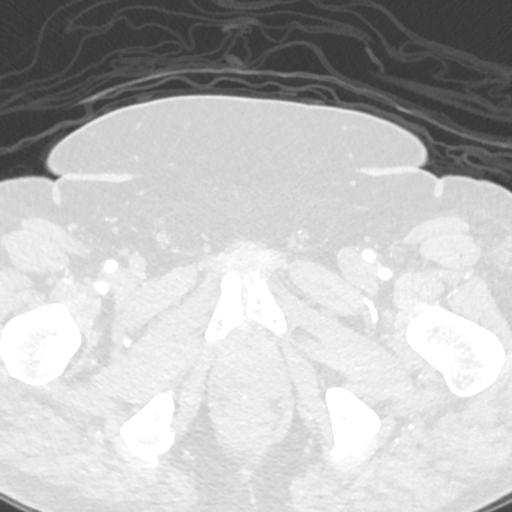
[im 77/692  mediastinal]
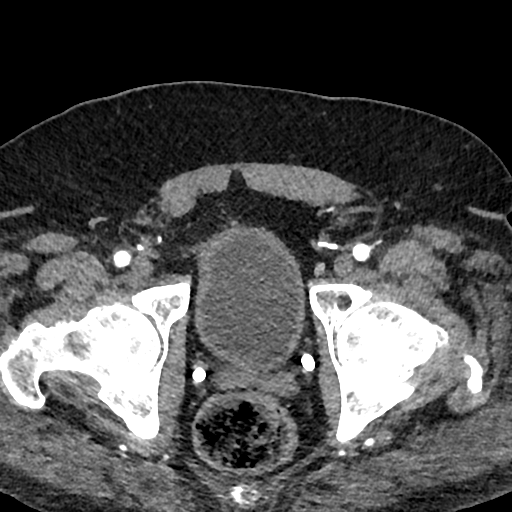
[im 116/692  lung]
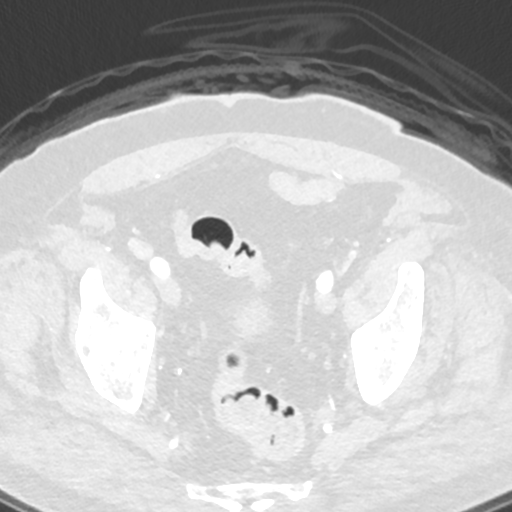
[im 154/692  mediastinal]
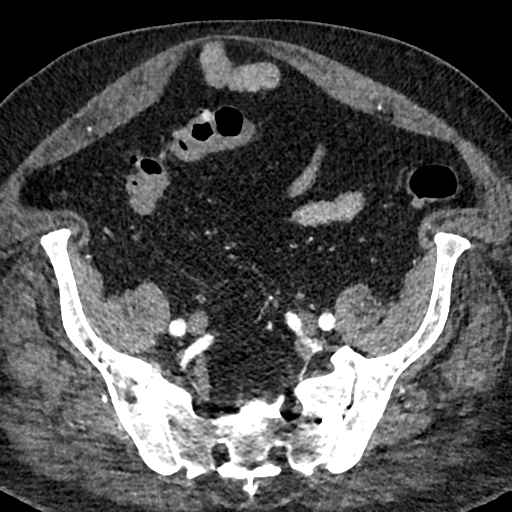
[im 192/692  lung]
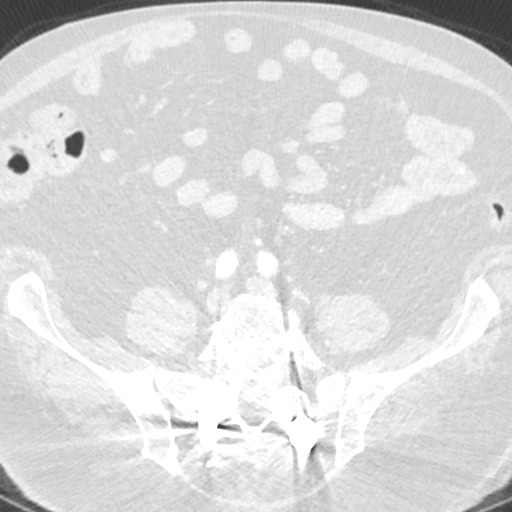
[im 231/692  mediastinal]
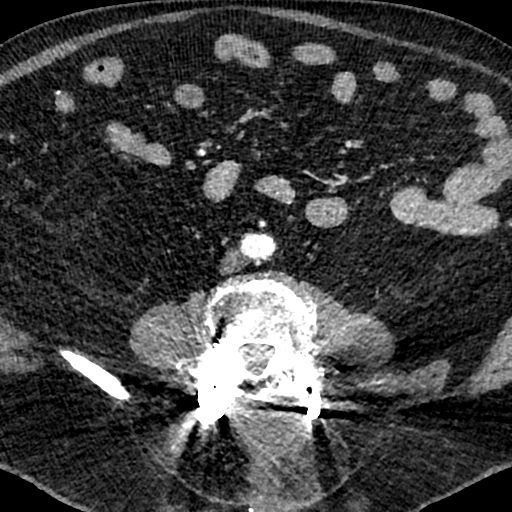
[im 269/692  lung]
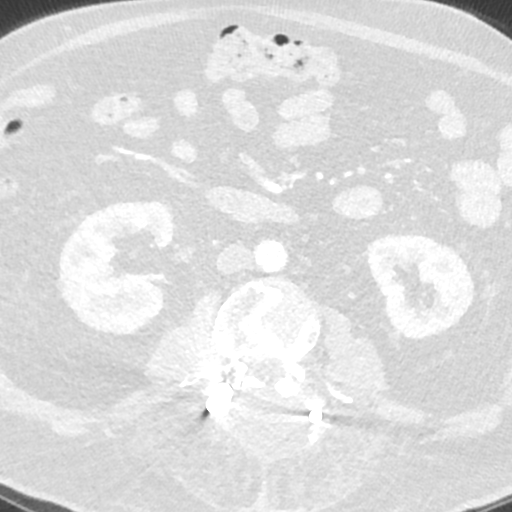
[im 308/692  mediastinal]
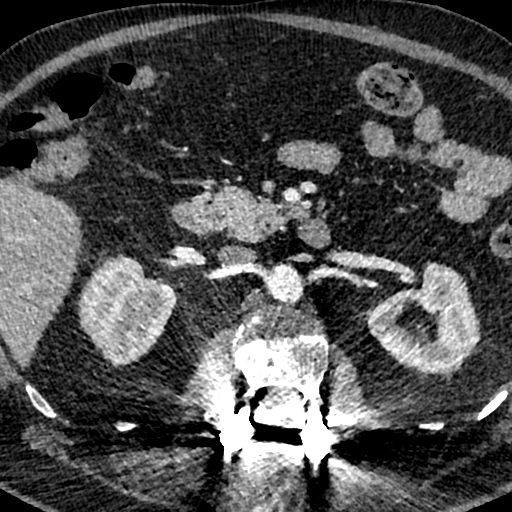
[im 384/692  lung]
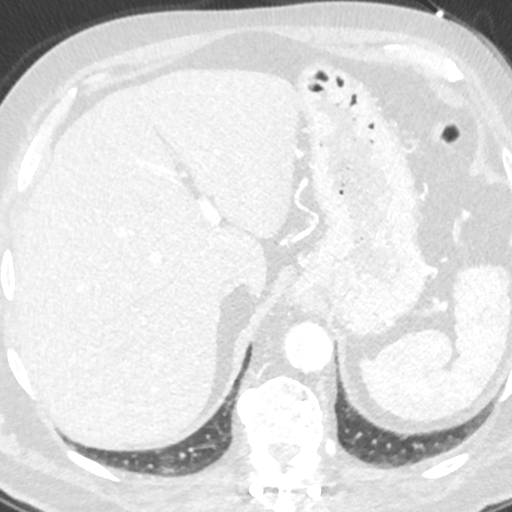
[im 423/692  mediastinal]
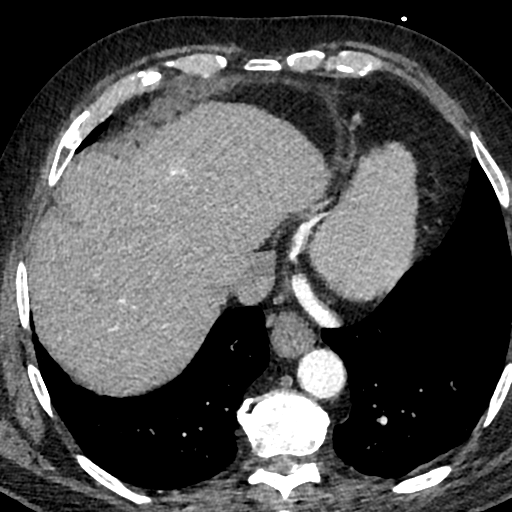
[im 461/692  lung]
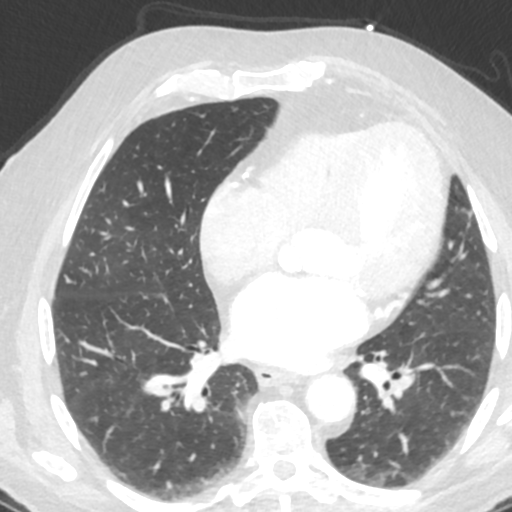
[im 500/692  mediastinal]
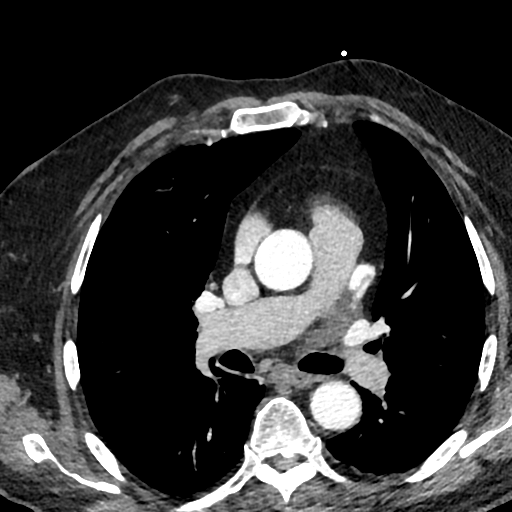
[im 538/692  lung]
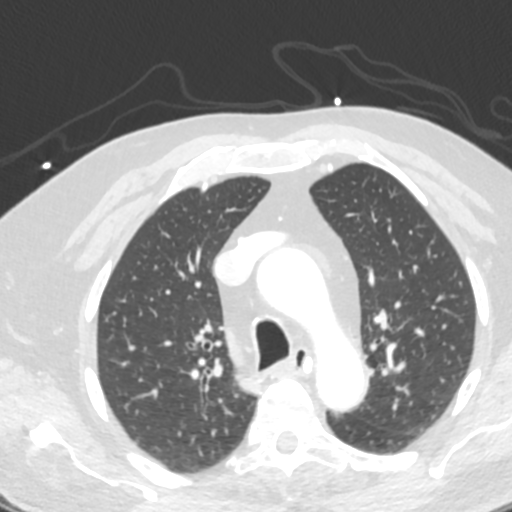
[im 576/692  mediastinal]
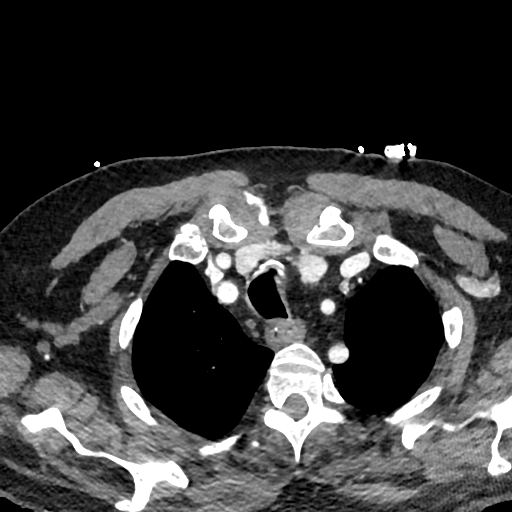
[im 615/692  lung]
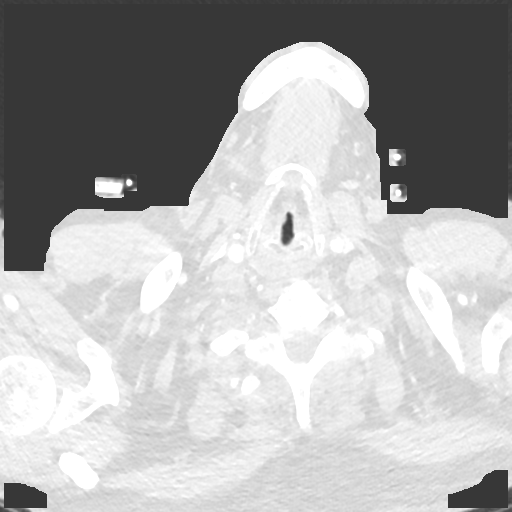
[im 653/692  mediastinal]
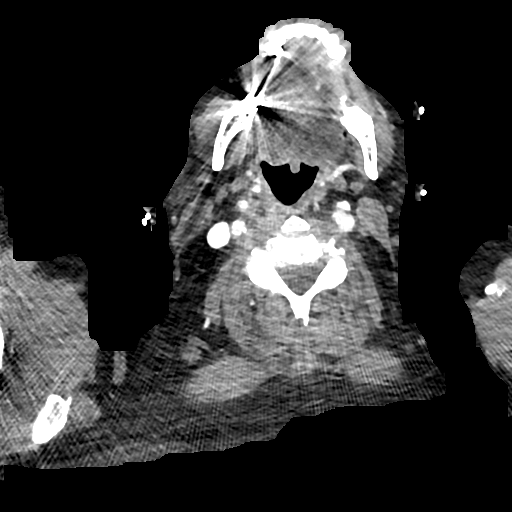

[Series 6: cor · coronal · 0.92mm/px · 1 of 189 slices shown]
[im 95/189  mediastinal]
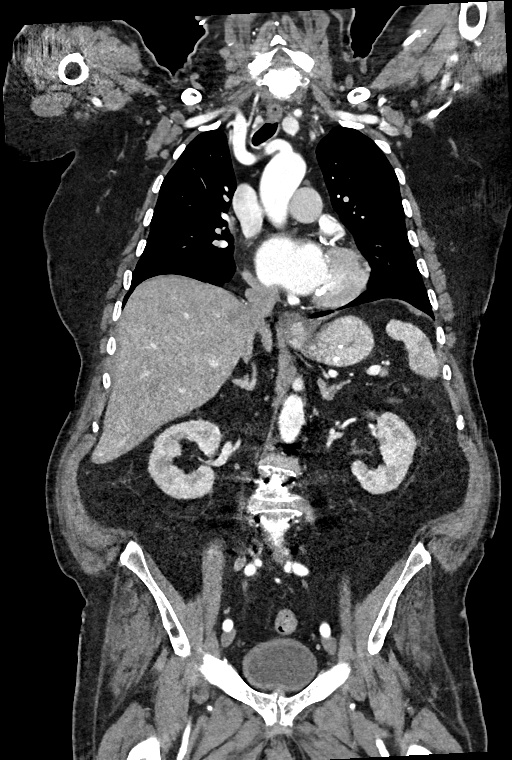

[17 of 36 positions shown; findings below may reference images not displayed]

Multidetector CT imaging through the chest, abdomen and pelvis was
performed using the standard protocol during bolus administration of
intravenous contrast. Multiplanar reconstructed images and MIPs were
obtained and reviewed to evaluate the vascular anatomy.

RADIATION DOSE REDUCTION: This exam was performed according to the
departmental dose-optimization program which includes automated
exposure control, adjustment of the mA and/or kV according to
patient size and/or use of iterative reconstruction technique.

CONTRAST:  80mL OMNIPAQUE IOHEXOL 350 MG/ML SOLN
FINDINGS: CTA CHEST FINDINGS

Cardiovascular: Normal heart size. No significant pericardial
effusion/thickening. Three-vessel coronary artery calcifications.
Severe aortic valve thickening and calcifications. Great vessels are
normal in course and caliber. No central pulmonary emboli.

Mediastinum/Nodes: No discrete thyroid nodules. Small hiatal hernia.
No pathologically enlarged axillary, mediastinal or hilar lymph
nodes.

Lungs/Pleura: Central airways are patent. No consolidation, pleural
effusion or pneumothorax. No pleural effusion.

Musculoskeletal:  No aggressive appearing focal osseous lesions.

CTA ABDOMEN AND PELVIS FINDINGS

Hepatobiliary: Small arterially enhancing lesions of the liver
measuring to 1.0 cm on series 4, image 101 and 0.4 cm on image 90.
Normal gallbladder with no radiopaque cholelithiasis. No biliary
ductal dilatation.

Pancreas: Normal, with no mass or duct dilation.

Spleen: Normal size. No mass.

Adrenals/Urinary Tract: Normal adrenals. No hydronephrosis or
nephrolithiasis. Bladder is unremarkable.

Stomach/Bowel: Normal non-distended stomach. Normal caliber small
bowel with no small bowel wall thickening. Diverticulosis. No large
bowel wall thickening or pericolonic fat stranding.

Vascular/Lymphatic: Normal caliber thoracic aorta with minimal
calcified plaque. No pathologically enlarged lymph nodes in the
abdomen or pelvis.

Reproductive: Mild prostatomegaly.

Other: No pneumoperitoneum, ascites or focal fluid collection. Small
fat containing inguinal hernias.

Musculoskeletal: No aggressive appearing focal osseous lesions.
Posterior fusion of the thoracolumbar spine.

VASCULAR MEASUREMENTS PERTINENT TO TAVR:

AORTA:

Minimal Aortic [WT] mm

Severity of Aortic Calcification-minimal.

RIGHT PELVIS:

Right Common Iliac Artery -

Minimal [WT] mm

Tortuosity-none

Calcification-none

Right External Iliac Artery -

Minimal [WT] mm

Tortuosity-none

Calcification-none

Right Common Femoral Artery -

Minimal [WT] mm

Tortuosity-none

Calcification-minimal.

LEFT PELVIS:

Left Common Iliac Artery -

Minimal [WT] mm

Tortuosity-none

Calcification-none

Left External Iliac Artery -

Minimal [WT] mm

Tortuosity-none

Calcification-none

Left Common Femoral Artery -

Minimal [WT] mm

Tortuosity-none

Calcification-none

Review of the MIP images confirms the above findings.
IMPRESSION: 1. Vascular findings and measurements pertinent to potential TAVR
procedure, as detailed above.
2. Severe thickening calcification of the aortic valve, compatible
with reported clinical history of severe aortic stenosis.
3. Minimal aortoiliac atherosclerosis. Three-vessel coronary artery
disease.
4. Arterially enhancing lesions of the liver measuring up to 1.1 cm,
potentially a flash fill hemangiomas; however, evaluation is limited
given single phase imaging. Recommend contrast-enhanced liver MRI
for further evaluation.

## 2021-12-18 IMAGING — CT CT HEART MORP W/ CTA COR W/ SCORE W/ CA W/CM &/OR W/O CM
2 of 7 series · 11 of 20 positions shown, 13 images · IV contrast (APPLIED)
Comparison: none

Addendum:
EXAM:
OVER-READ INTERPRETATION  CT CHEST

The following report is an over-read performed by radiologist Dr.
MATTHIJSEN [REDACTED] on [DATE]. This
over-read does not include interpretation of cardiac or coronary
anatomy or pathology. The coronary calcium score/coronary CTA
interpretation by the cardiologist is attached.
CLINICAL DATA: Aortic stenosis
Cardiac TAVR CT
TECHNIQUE: The patient was scanned on a Siemens Force [REDACTED]ice scanner. A 120
kV retrospective scan was triggered in the descending thoracic aorta
at 111 HU's. Gantry rotation speed was 270 msecs and collimation was
.9 mm. No beta blockade or nitro were given. The 3D data set was
reconstructed in 5% intervals of the R-R cycle. Systolic and
diastolic phases were analyzed on a dedicated work station using
MPR, MIP and VRT modes. The patient received 80 cc of contrast.

[Series 8: 0-90% · axial · 0.39mm/px · z∈[+1174,+1306]mm · 5 of 3280 slices shown]
[im 547/3280  vessel]
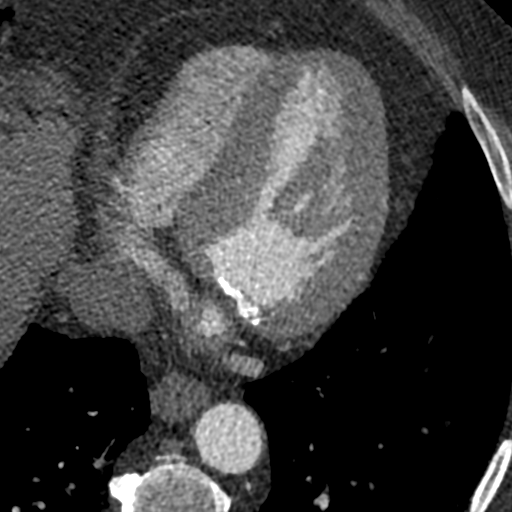
[im 1094/3280  vessel]
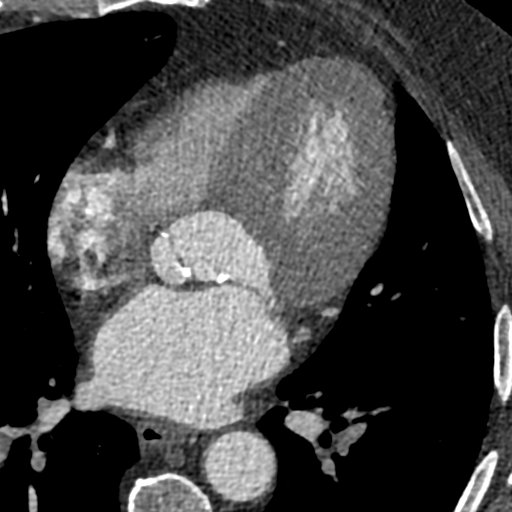
[im 1640/3280  vessel]
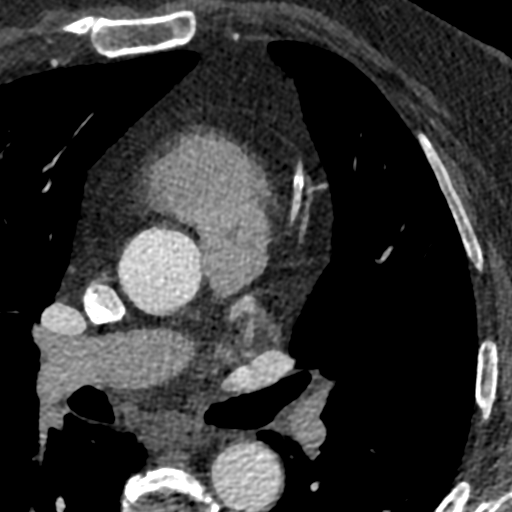
[im 2187/3280  vessel]
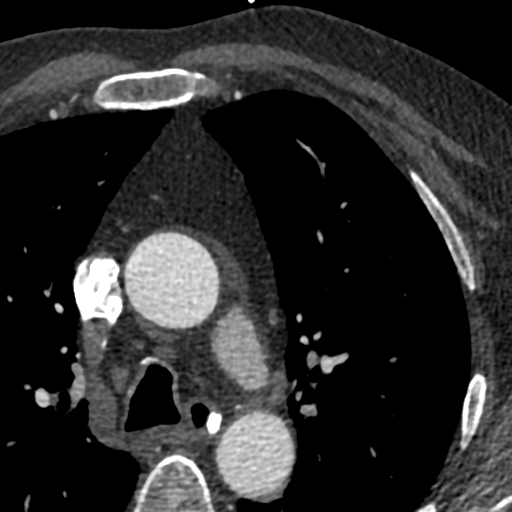
[im 2733/3280  vessel]
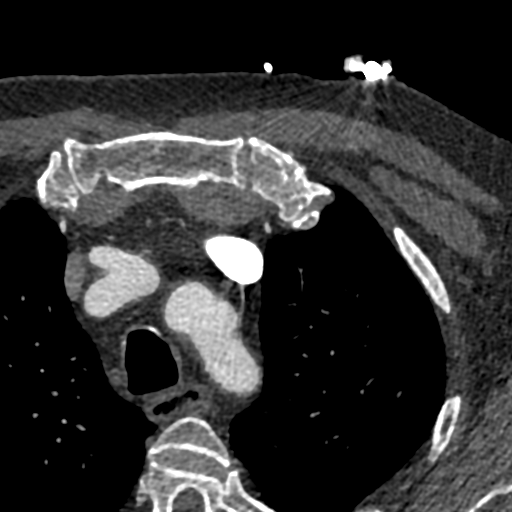

[Series 9: 5-95% · axial · 0.39mm/px · z∈[+1170,+1310]mm · 6 of 3280 slices shown, 8 images]
[im 469/3280  vessel]
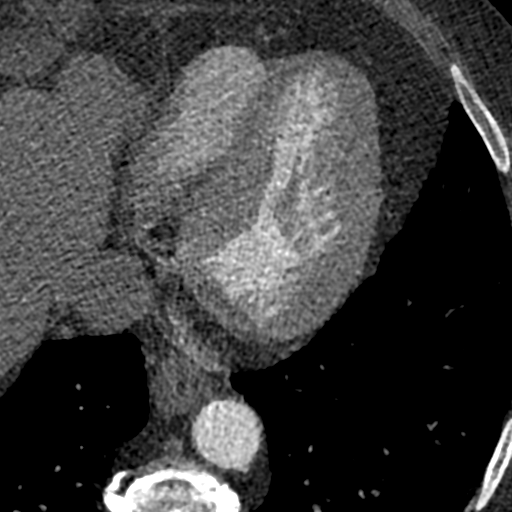
[im 469/3280  lung]
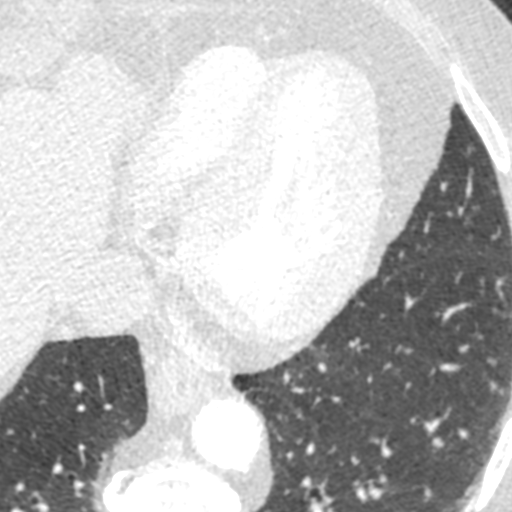
[im 937/3280  vessel]
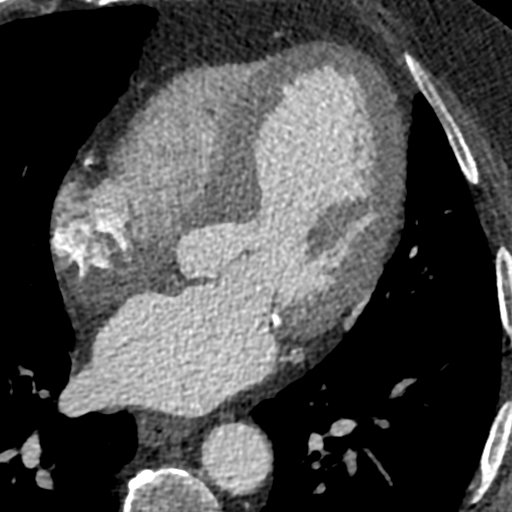
[im 1406/3280  vessel]
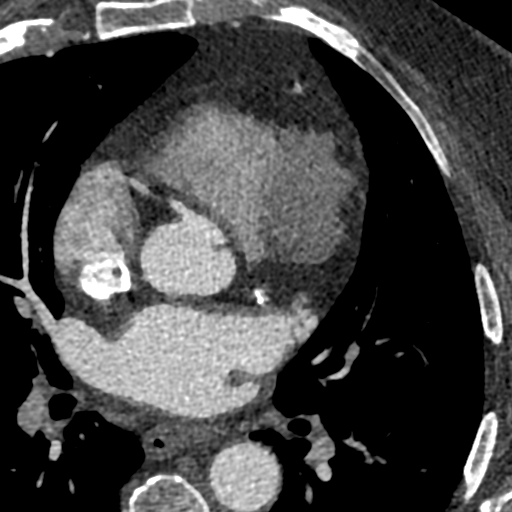
[im 1874/3280  vessel]
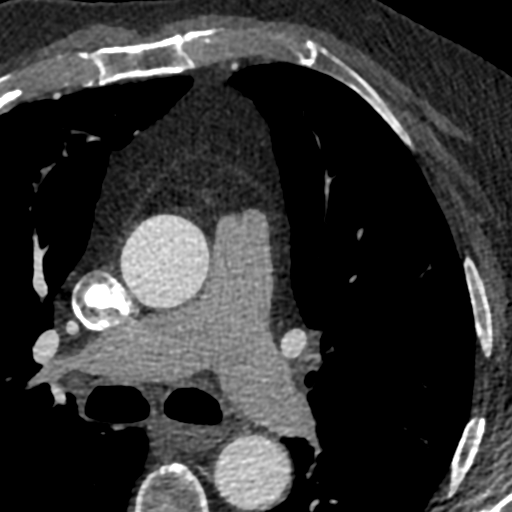
[im 2343/3280  vessel]
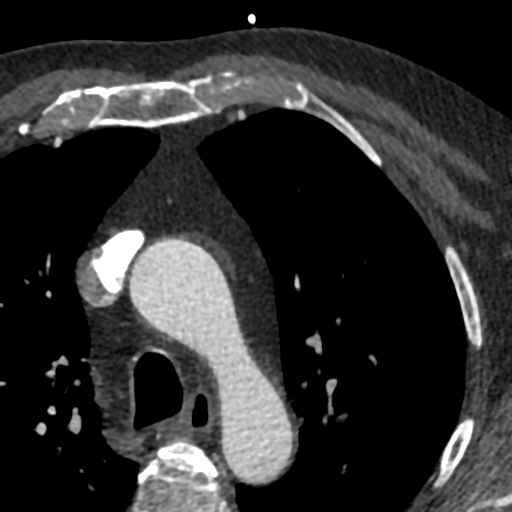
[im 2343/3280  lung]
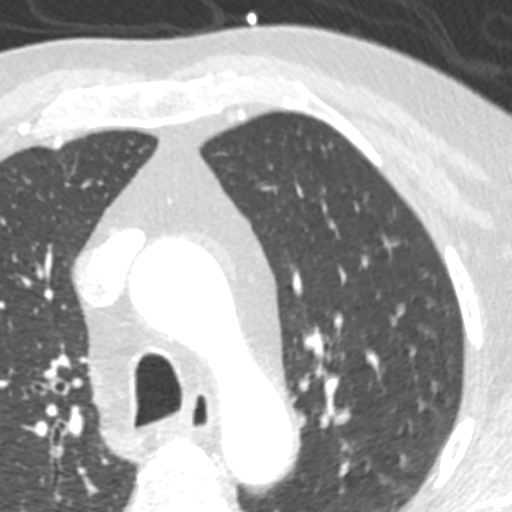
[im 2811/3280  vessel]
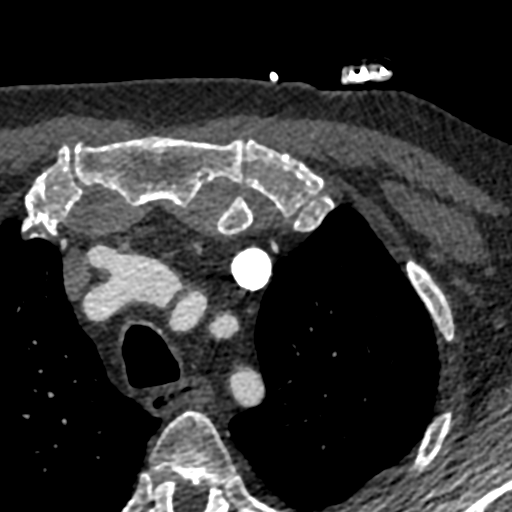

[11 of 20 positions shown; findings below may reference images not displayed]

FINDINGS: Extracardiac findings will be described separately under dictation
for contemporaneously obtained CTA chest, abdomen and pelvis.
IMPRESSION: Please see separate dictation for contemporaneously obtained CTA
chest, abdomen and pelvis dated [DATE] for full description of
relevant extracardiac findings.
FINDINGS: Aortic Valve: Tri leaflet calcified score [6V]

Aorta: No aneurysm. Right subclavian not anomalous but extremely
tortuous Right subclavian and carotid not suitable for access Mild
calcific atherosclerosis

Sinotubular Junction: 28 mm

Ascending Thoracic Aorta: 35 mm

Aortic Arch: 29 mm

Descending Thoracic Aorta: 29 mm

Sinus of Valsalva Measurements:

Non-coronary: 35.5 mm

Right - coronary: 33.8 mm

Left - coronary: 35.6 mm

Coronary Artery Height above Annulus:

Left Main: 15.9 mm above annulus

Right Coronary: 15.3 mm above annulus

Virtual Basal Annulus Measurements:

Maximum/Minimum Diameter: 30.7 mm x 27.5 mm

Perimeter: 90.5 mm

Area: 623 mm2

Coronary Arteries: Sufficient height above annulus for deployment

Optimum Fluoroscopic Angle for Delivery: LAO 6 Caudal 19
IMPRESSION: 1. Tri leaflet AV with score [6V]

2.  Annular area of 623 mm2 suitable for a 29 mm Sapien 3 valve

3.  Coronary arteries sufficient height above annulus for deployment

4. Optimum angiographic angle for deployment LAO 6 Caudal 19 degrees

5.  Tortuous right subclavian not suitable for access

6.  Membranous septum 9.4 mm length

MATTHIJSEN

*** End of Addendum ***
EXAM:
OVER-READ INTERPRETATION  CT CHEST

The following report is an over-read performed by radiologist Dr.
MATTHIJSEN [REDACTED] on [DATE]. This
over-read does not include interpretation of cardiac or coronary
anatomy or pathology. The coronary calcium score/coronary CTA
interpretation by the cardiologist is attached.
FINDINGS: Extracardiac findings will be described separately under dictation
for contemporaneously obtained CTA chest, abdomen and pelvis.
IMPRESSION: Please see separate dictation for contemporaneously obtained CTA
chest, abdomen and pelvis dated [DATE] for full description of
relevant extracardiac findings.

## 2021-12-18 MED ORDER — IOHEXOL 350 MG/ML SOLN
80.0000 mL | Freq: Once | INTRAVENOUS | Status: AC | PRN
  Administered 2021-12-18: 80 mL via INTRAVENOUS

## 2021-12-21 ENCOUNTER — Encounter: Payer: Self-pay | Admitting: Family Medicine

## 2021-12-21 ENCOUNTER — Ambulatory Visit (INDEPENDENT_AMBULATORY_CARE_PROVIDER_SITE_OTHER): Payer: Medicare HMO | Admitting: Family Medicine

## 2021-12-21 VITALS — BP 138/88 | HR 95 | Temp 98.3°F | Ht 71.0 in | Wt 223.0 lb

## 2021-12-21 DIAGNOSIS — M4325 Fusion of spine, thoracolumbar region: Secondary | ICD-10-CM | POA: Diagnosis not present

## 2021-12-21 DIAGNOSIS — Z09 Encounter for follow-up examination after completed treatment for conditions other than malignant neoplasm: Secondary | ICD-10-CM | POA: Diagnosis not present

## 2021-12-21 DIAGNOSIS — R55 Syncope and collapse: Secondary | ICD-10-CM

## 2021-12-21 DIAGNOSIS — Z6835 Body mass index (BMI) 35.0-35.9, adult: Secondary | ICD-10-CM | POA: Diagnosis not present

## 2021-12-21 DIAGNOSIS — M21372 Foot drop, left foot: Secondary | ICD-10-CM | POA: Diagnosis not present

## 2021-12-21 DIAGNOSIS — I10 Essential (primary) hypertension: Secondary | ICD-10-CM | POA: Diagnosis not present

## 2021-12-21 LAB — CBC
HCT: 28.7 % — ABNORMAL LOW (ref 39.0–52.0)
Hemoglobin: 9.5 g/dL — ABNORMAL LOW (ref 13.0–17.0)
MCHC: 33 g/dL (ref 30.0–36.0)
MCV: 78.8 fl (ref 78.0–100.0)
Platelets: 343 10*3/uL (ref 150.0–400.0)
RBC: 3.64 Mil/uL — ABNORMAL LOW (ref 4.22–5.81)
RDW: 17.4 % — ABNORMAL HIGH (ref 11.5–15.5)
WBC: 8.9 10*3/uL (ref 4.0–10.5)

## 2021-12-21 LAB — COMPREHENSIVE METABOLIC PANEL
ALT: 11 U/L (ref 0–53)
AST: 15 U/L (ref 0–37)
Albumin: 4 g/dL (ref 3.5–5.2)
Alkaline Phosphatase: 34 U/L — ABNORMAL LOW (ref 39–117)
BUN: 32 mg/dL — ABNORMAL HIGH (ref 6–23)
CO2: 23 mEq/L (ref 19–32)
Calcium: 9.2 mg/dL (ref 8.4–10.5)
Chloride: 103 mEq/L (ref 96–112)
Creatinine, Ser: 1.5 mg/dL (ref 0.40–1.50)
GFR: 44.5 mL/min — ABNORMAL LOW (ref >60.00)
Glucose, Bld: 136 mg/dL — ABNORMAL HIGH (ref 70–99)
Potassium: 4.5 mEq/L (ref 3.5–5.1)
Sodium: 137 mEq/L (ref 135–145)
Total Bilirubin: 0.3 mg/dL (ref 0.2–1.2)
Total Protein: 6.4 g/dL (ref 6.0–8.3)

## 2021-12-21 LAB — MAGNESIUM: Magnesium: 1.5 mg/dL (ref 1.5–2.5)

## 2021-12-21 LAB — PHOSPHORUS: Phosphorus: 3.4 mg/dL (ref 2.3–4.6)

## 2021-12-21 NOTE — Progress Notes (Signed)
Chief Complaint  Patient presents with   followup hospital    Followup from hospital    HPI Tommy Green is a 76 y.o. y.o. male who presents for a transition of care visit.  Pt was discharged from [facility] on 12/09/21.  Within 48 business hours of discharge our office contacted pt via telephone to coordinate care and needs.  He is here with his wife.  Patient was having lightheadedness and near syncope prior to his admission.  The cardiology team felt that his aortic stenosis, which is very severe, is contributing to his symptoms.  Looking back, both he and his wife think that he is having some fatigue and shortness of breath on exertion even before hospitalization.  They are strongly considering a TAVR.  They have an appointment with a specialist in around 2 weeks.  He continues to have fatigue but no lightheadedness/syncope since discharge.  He is eating and drinking normally.  Past Medical History:  Diagnosis Date   Anemia    Essential hypertension 07/18/2017   Foot drop, left foot    due to back surgery in 01/2021   History of colonic polyps    History of rheumatic fever    History of stroke 07/18/2017   Hypertriglyceridemia 07/18/2017   Severe aortic stenosis    Type 2 diabetes mellitus with diabetic neuropathy, unspecified ([place]) 07/18/2017   Past Surgical History:  Procedure Laterality Date   COLONOSCOPY     around 2018 [facility]   COLONOSCOPY  09/02/2020   CRANIOTOMY Left 10/25/2019   Procedure: CRANIOTOMY HEMATOMA EVACUATION SUBDURAL;  Surgeon: Vallarie Mare, MD;  Location: [facility];  Service: Neurosurgery;  Laterality: Left;   ESOPHAGOGASTRODUODENOSCOPY     around 2018 with [facility]   FOOT CAPSULOTOMY Left 07/25/2008   Mid Foot #2 MPJ   Hammertoe Repair Left 07/25/2008   #2 toe   RIGHT/LEFT HEART CATH AND CORONARY ANGIOGRAPHY N/A 12/11/2021   Procedure: RIGHT/LEFT HEART CATH AND CORONARY ANGIOGRAPHY;  Surgeon: Jettie Booze, MD;  Location: [facility];  Service: Cardiovascular;  Laterality: N/A;   SPINAL FUSION  01/2021   TARSAL TUNNEL RELEASE Left 07/25/2008   UPPER GASTROINTESTINAL ENDOSCOPY  09/02/2020   Family History  Problem Relation Age of Onset   Hyperlipidemia Mother    Colon polyps Mother    Hyperlipidemia Father    Alcoholism Brother    Healthy Son    Healthy Son    Cancer Neg Hx    Colon cancer Neg Hx    Esophageal cancer Neg Hx    Rectal cancer Neg Hx    Stomach cancer Neg Hx    Allergies as of 12/21/2021       Reactions   Fluzone Quadrivalent [influenza Vac Split Quad] Other (See Comments)   "Got the flu"   Influenza Vaccines Other (See Comments)   "Got the flu"   Aspirin Other (See Comments)   Gastroesophageal reflux and "allergic," per [facility]   Atorvastatin Other (See Comments)   Myalgias, GI upset, and "allergic," per [facility]   Canagliflozin Other (See Comments)   Pt reports that the use of Invokana caused acute KIDNEY FAILURE ([facility] records) and "allergic," per [facility]   Statins Other (See Comments)   Muscle pain- "allergic," per [facility]        Medication List        Accurate as of December 21, 2021 12:04 PM. If you have any questions, ask your nurse or doctor.  acetaminophen 325 MG tablet Commonly known as: TYLENOL Take 650 mg by mouth every 6 (six) hours as needed for mild pain or headache.   amLODipine 5 MG tablet Commonly known as: NORVASC Take 1 tablet (5 mg total) by mouth daily. What changed: when to take this   aspirin EC 81 MG tablet Take 162 mg by mouth every 6 (six) hours as needed for mild pain (or headaches- Swallow whole).   B COMPLEX PO Take 1 tablet by mouth daily.   Enbrel Mini 50 MG/ML Soct Generic drug: Etanercept Inject 50 mg into the skin once a week. What changed: when to take this   ezetimibe 10 MG tablet Commonly known as: Zetia Take 1 tablet (10 mg total) by mouth daily. For elevated cholesterol and triglycerides What changed: when to take this   fenofibrate  micronized 200 MG capsule Commonly known as: LOFIBRA TAKE 1 CAPSULE DAILY BEFOREBREAKFAST What changed: See the new instructions.   gabapentin 100 MG capsule Commonly known as: NEURONTIN TAKE 2 CAPSULES AT BEDTIME   HYDROcodone-acetaminophen 10-325 MG tablet Commonly known as: NORCO Take 0.5 tablets by mouth See admin instructions. Take 0.5 tablet by mouth at bedtime and an additional 0.5 tablet once a day as needed for pain   ibuprofen 200 MG tablet Commonly known as: ADVIL Take 600 mg by mouth every 6 (six) hours as needed for mild pain or headache.   insulin degludec 100 UNIT/ML FlexTouch Pen Commonly known as: TRESIBA Inject 30 Units into the skin at bedtime.   leflunomide 10 MG tablet Commonly known as: ARAVA TAKE 1 TABLET BY MOUTH EVERY DAY   lisinopril 40 MG tablet Commonly known as: ZESTRIL Take 40 mg by mouth in the morning.   magnesium oxide 400 MG tablet Commonly known as: MAG-OX Take 400 mg by mouth daily with lunch.   melatonin 5 MG Tabs Take 5 mg by mouth at bedtime.   metoprolol tartrate 50 MG tablet Commonly known as: LOPRESSOR Take 1 tablet (50 mg total) by mouth once for 1 dose.   multivitamin with minerals tablet Take 1 tablet by mouth daily with breakfast.   NovoLOG FlexPen 100 UNIT/ML FlexPen Generic drug: insulin aspart Inject 0-10 Units into the skin See admin instructions. Inject 0-10 units into the skin before meals, PER SLIDING SCALE: BGL 0-200 = give nothing, 201-250 = 2 units, 251-300 = 4 units, 301-350 = 6 units, 351-400 = 8 units, >400 = 10 units, push fluids, and repeat BGL check in 2 hours. If BGL remains >400 after 2 hours, CALL MD.   Omega 3 1000 MG Caps Take 1,000 mg by mouth daily with lunch.   omeprazole 20 MG capsule Commonly known as: PRILOSEC Take 1 capsule (20 mg total) by mouth daily before breakfast.   OneTouch Verio test strip Generic drug: glucose blood 4 (four) times daily.   Potassium 99 MG Tabs Take 99 mg by  mouth every evening.   vitamin B-12 1000 MCG tablet Commonly known as: CYANOCOBALAMIN Take 1,000 mcg by mouth daily.   Vitamin D3 Super Strength 50 MCG (2000 UT) Caps Generic drug: Cholecalciferol Take 2,000 Units by mouth daily.   vitamin E 180 MG (400 UNITS) capsule Take 400 Units by mouth daily.        ROS:  Constitutional: No fevers or chills, no weight loss HEENT: No headaches, hearing loss, or runny nose, no sore throat Heart: No chest pain Lungs: No cough Abd: No bowel changes, no pain, no N/V GU: No  urinary complaints Neuro: No numbness, tingling Msk: No new joint or muscle pain  Objective BP 138/88    Pulse 95    Temp 98.3 F (36.8 C) (Oral)    Ht '5\' 11"'$  (1.803 m)    Wt 223 lb (101.2 kg)    SpO2 97%    BMI 31.10 kg/m  General Appearance:  awake, alert, oriented, in no acute distress and well developed, well nourished Skin:  there are no rashes of concern Head/face:  NCAT Eyes:  EOMgi Lungs: Clear to auscultation.  No rales, rhonchi, or wheezing. Normal effort, no accessory muscle use. Heart:  Heart sounds are normal.  Regular rate and rhythm with 3/6 SEM heard loudest the aortic listening post, gallop or rub. No bruits. Neurologic:  Alert and oriented x 3, gait slow and cautious Psych exam: Nml mood and affect, age appropriate judgment and insight  Pre-syncope - Plan: CBC, Comprehensive metabolic panel, Magnesium, Phosphorus  Hospital discharge follow-up  We discussed his hospitalization, progress since then, and the consideration of a valve replacement.  Given his recent symptoms, both he and his wife are definitely willing to consider this option depending on the risks associated with it.  They have an appointment with a specialist on 01/07/2022.  We will follow-up on some labs at the recommendation of the hospitalist today.  Continue to stay hydrated.  He will hold off on physical therapy given his high levels of fatigue with relatively low intensity tasks. I  will see him as originally scheduled. The patient and his wife voiced understanding and agreement to the plan.  I spent 40 minutes with the patient discussing the above plan in addition to reviewing his chart on the same day of the visit.  [place], DO 12/21/21 12:04 PM

## 2021-12-21 NOTE — Patient Instructions (Addendum)
Give us 2-3 business days to get the results of your labs back.   Keep the diet clean and stay active.  Let us know if you need anything. 

## 2021-12-23 ENCOUNTER — Ambulatory Visit (INDEPENDENT_AMBULATORY_CARE_PROVIDER_SITE_OTHER): Payer: Medicare HMO | Admitting: Pharmacist

## 2021-12-23 DIAGNOSIS — I10 Essential (primary) hypertension: Secondary | ICD-10-CM

## 2021-12-23 DIAGNOSIS — R682 Dry mouth, unspecified: Secondary | ICD-10-CM

## 2021-12-23 DIAGNOSIS — E1169 Type 2 diabetes mellitus with other specified complication: Secondary | ICD-10-CM

## 2021-12-23 DIAGNOSIS — E1140 Type 2 diabetes mellitus with diabetic neuropathy, unspecified: Secondary | ICD-10-CM

## 2021-12-23 MED ORDER — LISINOPRIL 40 MG PO TABS: 40.0000 mg | ORAL_TABLET | Freq: Every morning | ORAL | 3 refills | Status: DC

## 2021-12-23 NOTE — Chronic Care Management (AMB) (Signed)
Chronic Care Management Pharmacy Note  12/23/2021 Name:  Tommy Green MRN:  [id number] DOB:  [birth year]  Summary: Patient reports dry mouth in improved a little with increased water intake and limiting sodium.  He is due refill for lisinopril. Needs updated prescription sent to mail order pharmacy.  Had 1 hypoglycemic event in the last month. Reviewed how to treat hypoglycemia.  Home blood pressure has been at goal.  Patient restarted ezetimibe and is tolerating well.  Plan: follow up 4 weeks to check blood pressure and dry mouth  Subjective: Tommy Green is an 76 y.o. year old male who is a primary patient of [name], DO.  The CCM team was consulted for assistance with disease management and care coordination needs.    Engaged with patient by telephone for follow up visit in response to provider referral for pharmacy case management and/or care coordination services.   Consent to Services:  The patient was given information about Chronic Care Management services, agreed to services, and gave verbal consent prior to initiation of services.  Please see initial visit note for detailed documentation.   Patient Care Team: [name], DO as PCP - General (Family Medicine) [name] Hilton Cork, MD as PCP - Cardiology (Cardiology) Alanson Aly, MD as Referring Physician (Internal Medicine) Jimmie Molly, OD (Optometry) Starling Manns, MD (Orthopedic Surgery) [name], RPH-CPP (Pharmacist)  Recent office visits: 12/21/2021 - Fam Med (Dr Nani Ravens) Seen for hospital follow up / pre syncope. Recommended suspending physical therapy for awhile. Check CBC, CMP, magnesium and phosphorus.  11/27/2021 - Fam Med (Dr Nani Ravens) Seen for sore toe. BP was 141/86. Recommended triple antibiotic ointment twice a day and cover with band-aid. Recommended mesh / better fitting shoes. 11/09/21-Nicholas Nolene Ebbs, DO (PCP, Video Visit) Seen for hypertension. AMB  Referral to [facility]. Stop Coreg, Start HCTZ. 08/31/21-Nicholas Nolene Ebbs, DO (PCP) Seen for Edema. Ambulatory referral to Sports Medicine. Ambulatory referral to Cardiology. Follow up visit prn.   Recent consult visits:  11/17/21-(Rheumatology) Bo Merino, MD. Medication management visit. Labs ordered. Follow up in 5 months.  10/29/21-(Podiatry) Gae Dry, DPM. Seen for diabetic foot exam. 10/20/21-(Cardiology) [name], MD. Echo cardiogram ordered. Follow up in 6 months. 09/17/21-(Sports Medicine) Rosemarie Ax, MD. Follow up for his left foot pain. 09/08/21-(Dermatology) Loleta Chance. Notes not available. 09/04/21-(Gastroenterology) [facility], [place]) Seen for abdominal pain. Symptoms began after back surgery in 01/2021. He received many antibiotics at that time and ever since he has had excessive gas and abdominal bloating. Suspected SIBO from DM and prior abdominal surgery. Pt declined Celiac labs, GIPP stool study, and a HBT but he was empirically started on Ciprofloxacin 574m BID x 10 days. Patient's symptoms improved, no indication for further evaluation. Continue probiotic and as needed simethicone over-the-counter. Discussed a low FODMAP diet as well for bloating. Follow up in 1 year or earlier. If symptoms return may need HBT or celiac labs, pt will update [place] 09/03/21-(Sport Medicine) JRosemarie Ax MD. Seen for left foot pain. Follow up in 2-3 weeks. 07/31/21-(Gastroenterology) NAchilles Dunk PA-C. Consult visit. Start on Cipro 5034mBID x 10 days 06/16/21-(Rheumatology) TaGeni BersDaQuita SkyePA-C. Seen for medication monitoring. Labs ordered. Follow up in 5 months.   Hospital visits: 12/09/2021 to 12/12/2021 [facility] plans to continue evaluation for TAVR. Medications Stopped / Removed from Med List: HCTZ 2572medications Started  at Discharge:  NONE Medication Changes at Discharge: NONE  Objective:  Lab Results  Component Value Date   CREATININE 1.50 12/21/2021   CREATININE 1.61 (H) 12/12/2021   CREATININE 1.57 (H) 12/11/2021    Lab Results  Component Value Date   HGBA1C 6.4 05/04/2021   Last diabetic Eye exam: No results found for: HMDIABEYEEXA  Last diabetic Foot exam: No results found for: HMDIABFOOTEX      Component Value Date/Time   CHOL 187 05/04/2021 0852   TRIG 315.0 (H) 05/04/2021 0852   HDL 28.10 (L) 05/04/2021 0852   CHOLHDL 7 05/04/2021 0852   VLDL 63.0 (H) 05/04/2021 0852   LDLCALC 79 03/06/2021 0413   LDLDIRECT 107.0 05/04/2021 0852    Hepatic Function Latest Ref Rng & Units 12/21/2021 12/12/2021 12/11/2021  Total Protein 6.0 - 8.3 g/dL 6.4 6.0(L) 5.9(L)  Albumin 3.5 - 5.2 g/dL 4.0 3.2(L) 3.2(L)  AST 0 - 37 U/L 15 21 17   ALT 0 - 53 U/L 11 14 12   Alk Phosphatase 39 - 117 U/L 34(L) 27(L) 29(L)  Total Bilirubin 0.2 - 1.2 mg/dL 0.3 0.4 0.6  Bilirubin, Direct 0.0 - 0.2 mg/dL - - -    No results found for: TSH, FREET4  CBC Latest Ref Rng & Units 12/21/2021 12/12/2021 12/11/2021  WBC 4.0 - 10.5 K/uL 8.9 8.7 -  Hemoglobin 13.0 - 17.0 g/dL 9.5(L) 9.6(L) 9.9(L)  Hematocrit 39.0 - 52.0 % 28.7(L) 30.5(L) 29.0(L)  Platelets 150.0 - 400.0 K/uL 343.0 288 -    Lab Results  Component Value Date/Time   VD25OH 26.88 (L) 10/24/2017 10:41 AM    Clinical ASCVD: Yes  The ASCVD Risk score (Arnett DK, et al., 2019) failed to calculate for the following reasons:   The patient has a prior MI or stroke diagnosis     Social History   Tobacco Use  Smoking Status Former   Years: 10.00   Types: Cigarettes   Quit date: 1973   Years since quitting: 50.2  Smokeless Tobacco Never   BP Readings from Last 3 Encounters:  12/21/21 138/88  12/18/21 110/66  12/12/21 122/76   Pulse Readings from Last 3 Encounters:  12/21/21 95  12/18/21 64  12/12/21 79   Wt Readings from Last 3 Encounters:  12/21/21 223 lb (101.2  kg)  12/09/21 221 lb 11.2 oz (100.6 kg)  11/27/21 223 lb 8 oz (101.4 kg)    Assessment: Review of patient past medical history, allergies, medications, health status, including review of consultants reports, laboratory and other test data, was performed as part of comprehensive evaluation and provision of chronic care management services.   SDOH:  (Social Determinants of Health) assessments and interventions performed:     CCM Care Plan  Allergies  Allergen Reactions   Fluzone Quadrivalent [Influenza Vac Split Quad] Other (See Comments)    "Got the flu"   Influenza Vaccines Other (See Comments)    "Got the flu"   Aspirin Other (See Comments)    Gastroesophageal reflux and "allergic," per MAR    Atorvastatin Other (See Comments)    Myalgias, GI upset, and "allergic," per MAR   Canagliflozin Other (See Comments)    Pt reports that the use of Invokana caused acute KIDNEY FAILURE ([facility] records) and "allergic," per [facility]   Statins Other (See Comments)    Muscle pain- "allergic," per [facility]    Medications Reviewed Today     Reviewed by [name], DO (Physician) on 12/21/21 at 1129  Med List Status: <  None>   Medication Order Taking? Sig Documenting Provider Last Dose Status Informant  acetaminophen (TYLENOL) 325 MG tablet [id number] No Take 650 mg by mouth every 6 (six) hours as needed for mild pain or headache. [provider] unk Active Multiple Informants  amLODipine (NORVASC) 5 MG tablet [id number] No Take 1 tablet (5 mg total) by mouth daily.  Patient taking differently: Take 5 mg by mouth at bedtime.   [name], DO 12/08/2021 pm Active Multiple Informants  aspirin EC 81 MG tablet [id number] No Take 162 mg by mouth every 6 (six) hours as needed for mild pain (or headaches- Swallow whole). [provider] unk Active Multiple Informants  B Complex Vitamins (B COMPLEX PO) [id number] No Take 1 tablet by mouth daily. [provider]  12/09/2021 Active Multiple Informants  Cholecalciferol (VITAMIN D3 SUPER STRENGTH) 50 MCG (2000 UT) CAPS [id number] No Take 2,000 Units by mouth daily. [provider] 12/09/2021 Active Multiple Informants  Etanercept (ENBREL MINI) 50 MG/ML SOCT [id number] No Inject 50 mg into the skin once a week.  Patient taking differently: Inject 50 mg into the skin every Monday.   [name], PA-C 12/07/2021 am Active Multiple Informants  ezetimibe (ZETIA) 10 MG tablet [id number] No Take 1 tablet (10 mg total) by mouth daily. For elevated cholesterol and triglycerides  Patient taking differently: Take 10 mg by mouth at bedtime. For elevated cholesterol and triglycerides   [name], DO 12/08/2021 pm Active Multiple Informants  fenofibrate micronized (LOFIBRA) 200 MG capsule [id number] No TAKE 1 CAPSULE DAILY BEFOREBREAKFAST  Patient taking differently: Take 200 mg by mouth daily before breakfast.   [name], DO 12/09/2021 am Active Multiple Informants  gabapentin (NEURONTIN) 100 MG capsule [id number] No TAKE 2 CAPSULES AT BEDTIME  Patient taking differently: Take 200 mg by mouth at bedtime.   [name], DO 12/08/2021 pm Active Multiple Informants  HYDROcodone-acetaminophen (NORCO) 10-325 MG tablet [id number] No Take 0.5 tablets by mouth See admin instructions. Take 0.5 tablet by mouth at bedtime and an additional 0.5 tablet once a day as needed for pain [provider] 12/08/2021 pm Active Multiple Informants  ibuprofen (ADVIL) 200 MG tablet [id number] No Take 600 mg by mouth every 6 (six) hours as needed for mild pain or headache. [provider] unk Active Multiple Informants  insulin aspart (NOVOLOG FLEXPEN) 100 UNIT/ML FlexPen [id number] No Inject 0-10 Units into the skin See admin instructions. Inject 0-10 units into the skin before meals, PER SLIDING SCALE: BGL 0-200 = give nothing, 201-250 = 2 units, 251-300 = 4 units, 301-350 = 6 units, 351-400 =  8 units, >400 = 10 units, push fluids, and repeat BGL check in 2 hours. If BGL remains >400 after 2 hours, CALL MD. [provider] 12/08/2021 pm Active Multiple Informants  insulin degludec (TRESIBA) 100 UNIT/ML FlexTouch Pen [id number] No Inject 30 Units into the skin at bedtime. Donne Hazel, MD 12/08/2021 pm Active Multiple Informants  leflunomide (ARAVA) 10 MG tablet [id number]  TAKE 1 TABLET BY MOUTH EVERY DAY [name], PA-C  Active   lisinopril (ZESTRIL) 40 MG tablet [id number] No Take 40 mg by mouth in the morning. [provider] 12/09/2021 am Active Multiple Informants  magnesium oxide (MAG-OX) 400 MG tablet [id number] No Take 400 mg by mouth daily with lunch. [provider] 12/08/2021 am Active Multiple Informants  melatonin 5 MG TABS [id number] No Take 5 mg by mouth at bedtime. [provider] 12/08/2021 pm  Active Multiple Informants  metoprolol tartrate (LOPRESSOR) 50 MG tablet [id number]  Take 1 tablet (50 mg total) by mouth once for 1 dose. Eileen Stanford, PA-C  Active   Multiple Vitamins-Minerals (MULTIVITAMIN WITH MINERALS) tablet [id number] No Take 1 tablet by mouth daily with breakfast. [provider] 12/09/2021 am Active Multiple Informants  Omega 3 1000 MG CAPS [id number] No Take 1,000 mg by mouth daily with lunch. [provider] 12/08/2021 1200 Active Multiple Informants  omeprazole (PRILOSEC) 20 MG capsule [id number] No Take 1 capsule (20 mg total) by mouth daily before breakfast. [name], DO 12/08/2021 am Active Multiple Informants  ONETOUCH VERIO test strip [id number] No 4 (four) times daily. [provider] Taking Active Multiple Informants  Potassium 99 MG TABS [id number] No Take 99 mg by mouth every evening. [provider] 12/08/2021 pm Active Multiple Informants  vitamin B-12 (CYANOCOBALAMIN) 1000 MCG tablet [id number] No Take 1,000 mcg by mouth daily. [provider] 12/09/2021 Active  Multiple Informants  vitamin E 180 MG (400 UNITS) capsule [id number] No Take 400 Units by mouth daily. [provider] 12/09/2021 Active Multiple Informants            Patient Active Problem List   Diagnosis Date Noted   Microcytic anemia 12/10/2021   Obesity (BMI 30-39.9) 12/10/2021   Near syncope 12/09/2021   Aortic stenosis 12/09/2021   Rheumatoid arthritis ([place]) 12/09/2021   Foot drop, left foot 10/02/2021   History of colonic polyps 10/02/2021   History of rheumatic fever 10/02/2021   Stress fracture of left foot 09/03/2021   Gabapentin overdose, accidental or unintentional, initial encounter 03/08/2021   TIA (transient ischemic attack) 03/05/2021   Body mass index (BMI) 36.0-36.9, adult 10/03/2020   Subdural hemorrhage following injury without open intracranial wound and with prolonged loss of consciousness (more than 24 hours) without return to pre-existing conscious level ([place]) 10/03/2020   AKI (acute kidney injury) ([place])    Acute encephalopathy    SDH (subdural hematoma) 97/41/6384   Cyclic vomiting syndrome 08/06/2019   Neuropathy 12/25/2018   AK (actinic keratosis) 08/24/2018   Neoplasm of uncertain behavior 53/64/6803   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   Tendinopathy of right gluteus medius 07/13/2018   Tendinopathy of left gluteus medius 07/13/2018   Squamous cell carcinoma in situ (SCCIS) of skin 03/24/2018   Vitamin D deficiency 11/29/2017   Stage 3 chronic kidney disease ([place]) 10/28/2017   Primary osteoarthritis of right hip 09/11/2017   Essential hypertension 07/18/2017   Hyperlipidemia associated with type 2 diabetes mellitus ([place]) 07/18/2017   Type 2 diabetes mellitus with diabetic neuropathy, unspecified ([place]) 07/18/2017   Heart murmur 07/18/2017   Hypertriglyceridemia 07/18/2017   History of stroke 07/18/2017   DDD (degenerative disc disease), lumbar 06/15/2017   Iron deficiency anemia 06/15/2017   Stroke ([place]) 06/15/2017   Encounter for  hepatitis C screening test for low risk patient 06/30/2016   Microalbuminuria due to type 2 diabetes mellitus ([place]) 03/19/2016   Hypertrophy of inferior nasal turbinate 12/20/2015   Disease of nasal cavity and sinuses 12/10/2015   Degenerative tear of acetabular labrum of left hip 11/04/2015   History of tobacco abuse 05/15/2015   Morbid (severe) obesity due to excess calories ([place]) 04/11/2015   Disorder of both eustachian tubes 01/27/2015   Maxillary sinusitis 01/13/2015   Lipoprotein deficiency 01/31/2014   Gastro-esophageal reflux disease without esophagitis 11/02/2013   Rheumatic fever 11/02/2013   Erectile dysfunction associated with type 2 diabetes mellitus ([place]) 07/31/2013   Type  2 diabetes mellitus with hyperglycemia, without long-term current use of insulin ([place]) 11/16/2012    Immunization History  Administered Date(s) Administered   Fluad Quad(high Dose 65+) 08/31/2021   Influenza-Unspecified 07/31/2013, 04/17/2014   Moderna Sars-Covid-2 Vaccination 12/11/2019, 01/10/2020, 08/08/2020   Pneumococcal Conjugate-13 08/07/2015   Pneumococcal Polysaccharide-23 04/17/2013, 07/18/2013   Td 12/25/2018   Zoster, Live 04/17/2013    Conditions to be addressed/monitored: CAD, HTN, HLD, DMII, and dry mouth / dental issues; neuropathy; TAVR; aortic stenosis, severe; CKD - 3  Care Plan : General Pharmacy (Adult)  Updates made by [name], RPH-CPP since 12/23/2021 12:00 AM     Problem: Chronic conditions: HTN; Rheumatoid arthritis; dry mouth; chronic back pain; HDL with hypertriglyceridemia; Type 2 DM; CKD;Nonrheumatic aortic valve stenosis; anemia      Long-Range Goal: Provide education, support and care coordination for medication therapy and chronic conditions   Start Date: 11/25/2021  Priority: High  Note:   Current Barriers:  Unable to achieve control of hyperlipidemia, mixed with elevated LDL and triglycerides  Does not adhere to prescribed medication regimen Experiencing  dry mouth with effects on dental health - concerned it could be medication related  Pharmacist Clinical Goal(s):  Over the next 90 days, patient will achieve adherence to monitoring guidelines and medication adherence to achieve therapeutic efficacy achieve control of mixed hyperlipidemia  as evidenced by LDL < 70 maintain control of hypertension and type 2 DM as evidenced by blood pressure < 130/80 and A1c < 7.0  Identify medication causes of dry mouth and is possible identify alternative therapies  through collaboration with PharmD and provider.   Interventions: 1:1 collaboration with [name], DO regarding development and update of comprehensive plan of care as evidenced by provider attestation and co-signature Inter-disciplinary care team collaboration (see longitudinal plan of care) Comprehensive medication review performed; medication list updated in electronic medical record  Dry Mouth:  Patient reports dry mouth that started several months ago.   He has had several crowns that have come off and his dentist is concerned that it is related to his medications Patient's wife felt that carvedilol was cause of dry mouth because it has been started in the last year.  Carvedilol was stopped 11/2021. Patient was to start hydrochlorothiazide in place of carvedilol but has not due to community pharmacist warning that diuretics could also cause dry mouth.  At last visit reviewed medications for potential effects on dental health and dry mouth Amlodipine is associated with gingival hyperplasia incidence is unknown, but may be as high as 10%; However patient's symptoms do not seem to align with gingival hyperplasia Gabapentin has reported 1.7 to 4.8% incidence of xerostomia. Patient has tapered to only 27m at bedtime over the last year. Since dry mouth symptoms did not seem to less over the period that patient was tapering gabapentin I do not have high suspicion that gabapentin is  causing dry mouth but might be something for patient to try holding for 1 week to see if she notices any changes. Hydrocodone - patient is only taking 1 at night and has been tapering since his back surgery in December 2022. Usually more an issue with high doses of opioid medications  Medical Conditions associated with potential dry mouth:  Rheumatoid arthritis / Sjogren's Syndrome / autoimmune conditions: recommended patient discuss with his rheumatologist Type 2 DM: usually when poorly controlled; patient's home blood glucose and last A1c do not indicate he should be having a lot of dry mouth due to diabetes.  12/23/2021 Update: Patient report dry mouth improving. Biotene did not really help much but he is drinking more water and limiting salt and this seems to have helped a little. Current therapy:  Over-the-counter dry mouth lozenges as needed  Interventions:  Continue over-the-counter gum, lozenges or rinses for dry mouth like ACT dry mouth, Biotene or Hydral. However he should use sparingly since the sugar alcohols (sorbitol and xylitol) in them might cause gas, bloating and loose stools and he has recently been seen by GI for these symptoms.  Continue to drink more water and limit salt intake  Diabetes / CKD - 3a: Diabetes controlled per last A1c but is due to see endocrinologist. A1c goal < 7.0%  Lab Results  Component Value Date   CREATININE 1.50 12/21/2021   CREATININE 1.61 (H) 12/12/2021   CREATININE 1.57 (H) 12/11/2021  Endocrinologist is Dr Meredith Pel Current treatment: Tyler Aas 30 units daily at bedtime Novolog insulin inject 0 to 10 units per sliding scale prior to each meal (patient reports he usually only needs once per day) Current glucose readings: usually 90 to 120 in the morning; has occasional readings 150 to 180 prior to meals. Mentions he has 1 hypoglycemia event with blood glucose 70. Recheck 1 hour later and blood glucose was 103. Current exercise: none currently due to  dizziness - will see cardiologist 01/07/2022 for evaluation for TAVR Interventions:  Follow up appointment with Dr Meredith Pel.  Might consider adding SGLT2 due to rise in Scr. However there is a notation that Invokana cause renal failure in past so if started would start with low dose and monitor closely  How to Treat a Low Glucose Level:  If you have a low blood glucose less than 70, please eat / drink 15 grams of carbohydrates (4 oz of juice, soda (not diet), 4 glucose tablets, or 3-4 pieces of hard candy).  It is best so choose a "quick" source of sugar that does no contain fat (chocolate and peanut butter might take longer to increase your blood glucose) Wait 15 minutes and then recheck your blood glucose. If your blood glucose is still less than 70, eat another 15 grams of carbohydrates.  Wait another 15 minutes and recheck your glucose.  Continue this until your blood glucose is over 70. Once you blood glucose is over 70, eat a snack with protein in it to prevent your blood glucose from dropping again. Reviewed home blood glucose readings and reviewed goals  Fasting blood glucose goal (before meals) = 80 to 130 Blood glucose goal after a meal = less than 180   Hypertension: Controlled; blood pressure goal < 130/80 Current treatment: Amlodipine 38m daily  Lisinopril 455mdaily  Previous therapy tried: carvedilol - stopped due to suspected dry mouth Current home readings:  SBP 120-130 DBP 70 to 83 Recent hospitalization for syncope. Patient will see cardiologist 01/07/2022 to evaluate for possible TAVR Interventions:  Coordinated refill for lisinopril 4058mo mail order pharmacy. Discussed adherence. Continue to take lisinopril and amlodipine Continue to check blood pressure daily and record  Hyperlipidemia, mixed: Uncontrolled; LDL goal < 70 and Tg goal < 150 current treatment: Fenofibrate 200m25mily with breakfast.  Ezetimbe 10mg69mly Fish oil 1000mg 43my Medications previously  tried: atorvastatin - myalgias; Has also taken pravastatin and lovastatin in past; notation that patient is intolerant to all statins Patient has restarted ezetimibe and is tolerating well.  Interventions:  Continue ezetimibe 10mg d13m, fenofibrate and fish oil. Recheck lipids in 4 to 6 weeks.  Patient Goals/Self-Care Activities Over the next 90 days, patient will:  take medications as prescribed,  check glucose 3 to 4 times a day prior to meals and at bedtime or as needed, document, and provide at future appointments,  check blood pressure 2 to 3 times per week, document, and provide at future appointments, and  Recommended try over-the-counter gum, lozenges or rinses for dry mouth like ACT dry mouth, Biotene or Hydral. However use sparingly since the sugar alcohols (sorbitol and xylitol) in them might cause gas, bloating and loose stools.  How to Treat a Low Glucose Level:  If you have a low blood glucose less than 70, please eat / drink 15 grams of carbohydrates (4 oz of juice, soda (not diet), 4 glucose tablets, or 3-4 pieces of hard candy).  It is best so choose a "quick" source of sugar that does no contain fat (chocolate and peanut butter might take longer to increase your blood glucose) Wait 15 minutes and then recheck your blood glucose. If your blood glucose is still less than 70, eat another 15 grams of carbohydrates.  Wait another 15 minutes and recheck your glucose.  Continue this until your blood glucose is over 70. Once you blood glucose is over 70, eat a snack with protein in it to prevent your blood glucose from dropping again.  Follow Up Plan: Telephone follow up appointment with care management team member scheduled for:  4 to 6 weeks.          Medication Assistance: None required.  Patient affirms current coverage meets needs.  Patient's preferred pharmacy is:  [facility] [place] [facility] [address]  [address] [place] [address] Phone: [phone] Fax: [phone]  [id number] [address] [address]Phone: [phone]: [phone]  Follow Up:  Patient agrees to Care Plan and Follow-up.  Plan: Telephone follow up appointment with care management team member scheduled for:  4 to 6 weeks to recheck adherence and dry mouth  TCherre Robins PharmD Clinical Pharmacist [facility]

## 2021-12-23 NOTE — Patient Instructions (Addendum)
Tommy Green,  ?It was a pleasure speaking with you today.  ?I have attached a summary of our visit today and information about your health goals. (See below) ? ?Our next appointment is by telephone on Feb 25, 2022 at 8:30 ? ?Please call the care guide team at [phone] if you need to cancel or reschedule your appointment.  ? ?If you have any questions or concerns, please feel free to contact me either at the phone number below or with a MyChart message.  ? ?Keep up the good work! ? ?[name], PharmD ?Clinical Pharmacist ?[facility] ?[place] ?[phone] (direct line)  ?[phone] (main office number) ? ? ?Patient Goals/Self-Care Activities ?take medications as prescribed,  ?check glucose 3 to 4 times a day prior to meals and at bedtime or as needed, document, and provide at future appointments,  ?check blood pressure 2 to 3 times per week, document, and provide at future appointments, and  ?Recommended try over-the-counter gum, lozenges or rinses for dry mouth like ACT dry mouth, Biotene or Hydral. However use sparingly since the sugar alcohols (sorbitol and xylitol) in them might cause gas, bloating and loose stools.  ?How to Treat a Low Glucose Level:  ?If you have a low blood glucose less than 70, please eat / drink 15 grams of carbohydrates (4 oz of juice, soda (not diet), 4 glucose tablets, or 3-4 pieces of hard candy).  It is best so choose a "quick" source of sugar that does no contain fat (chocolate and peanut butter might take longer to increase your blood glucose) ?Wait 15 minutes and then recheck your blood glucose. If your blood glucose is still less than 70, eat another 15 grams of carbohydrates.  ?Wait another 15 minutes and recheck your glucose.  ?Continue this until your blood glucose is over 70. Once you blood glucose is over 70, eat a snack with protein in it to prevent your blood glucose from dropping again. ? ?The patient verbalized understanding of  instructions, educational materials, and care plan provided today and agreed to receive a mailed copy of patient instructions, educational materials, and care plan.   ?

## 2021-12-24 ENCOUNTER — Encounter: Payer: Medicare HMO | Admitting: Physical Therapy

## 2021-12-28 ENCOUNTER — Telehealth (INDEPENDENT_AMBULATORY_CARE_PROVIDER_SITE_OTHER): Payer: Medicare HMO | Admitting: Family Medicine

## 2021-12-28 ENCOUNTER — Telehealth: Payer: Self-pay | Admitting: Family Medicine

## 2021-12-28 ENCOUNTER — Encounter: Payer: Self-pay | Admitting: Family Medicine

## 2021-12-28 DIAGNOSIS — H9202 Otalgia, left ear: Secondary | ICD-10-CM | POA: Diagnosis not present

## 2021-12-28 MED ORDER — PREDNISONE 20 MG PO TABS
40.0000 mg | ORAL_TABLET | Freq: Every day | ORAL | 0 refills | Status: AC
Start: 2021-12-28 — End: 2022-01-02

## 2021-12-28 NOTE — Progress Notes (Signed)
Chief Complaint  ?Patient presents with  ? Ear Pain  ? ? ?Pt is here for dull left ear pain. Due to COVID-19 pandemic, we are interacting via telephone. I verified patient's ID using 2 identifiers. Patient agreed to proceed with visit via this method. Patient is at home, I am at office. Patient and I are present for visit.  ? ?Duration: 2 days ?Progression:  slightly better ?Associated symptoms: congestion, decreased hearing, and sinus pressure ?Denies: sore throat, sneezing, fevers, and ear pressure, bleeding, or discharge from ear ?Treatment to date: OTC ear drops - does not remember name ?Not wax.  ? ?Past Medical History:  ?Diagnosis Date  ? Anemia   ? Essential hypertension 07/18/2017  ? Foot drop, left foot   ? due to back surgery in 01/2021  ? History of colonic polyps   ? History of rheumatic fever   ? History of stroke 07/18/2017  ? Hypertriglyceridemia 07/18/2017  ? Severe aortic stenosis   ? Type 2 diabetes mellitus with diabetic neuropathy, unspecified ([place]) 07/18/2017  ? ?Objective ?No conversational dyspnea ?Age appropriate judgment and insight ?Nml affect and mood ? ?Left ear pain - Plan: predniSONE (DELTASONE) 20 MG tablet ? ?Likely ETD +/- effusion given hearing loss. 5 d pred burst 40 mg/d. Will send message if no better in next 7-10 d.  ?F/u prn.  ? ?Total time: 7 min ?Pt voiced understanding and agreement to the plan. ? ?[name], DO ?12/28/21 ?3:36 PM ? ?

## 2021-12-28 NOTE — Telephone Encounter (Signed)
Pt's spouse came in office stating pt was seen in office on 12-21-2021 and pt now has an ear infection (left ear) pt is wanting to know if he can get antibiotic for this issue. (Pt's spouse was informed pt is needing an appt but pt's spouse stated wanted [place] to still ask provider first. Please advise. Pt tel [phone].  ?

## 2021-12-28 NOTE — Telephone Encounter (Signed)
Called and scheduled video visit ? ?

## 2021-12-31 ENCOUNTER — Encounter: Payer: Medicare HMO | Admitting: Physical Therapy

## 2021-12-31 DIAGNOSIS — M25511 Pain in right shoulder: Secondary | ICD-10-CM | POA: Diagnosis not present

## 2022-01-05 DIAGNOSIS — M4320 Fusion of spine, site unspecified: Secondary | ICD-10-CM | POA: Diagnosis not present

## 2022-01-07 ENCOUNTER — Encounter: Payer: Medicare HMO | Admitting: Physical Therapy

## 2022-01-07 ENCOUNTER — Institutional Professional Consult (permissible substitution): Payer: Medicare HMO | Admitting: Surgery

## 2022-01-07 ENCOUNTER — Other Ambulatory Visit: Payer: Self-pay

## 2022-01-07 ENCOUNTER — Encounter: Payer: Self-pay | Admitting: Surgery

## 2022-01-07 VITALS — BP 185/80 | HR 107 | Resp 20 | Ht 71.0 in | Wt 225.0 lb

## 2022-01-07 DIAGNOSIS — I350 Nonrheumatic aortic (valve) stenosis: Secondary | ICD-10-CM | POA: Diagnosis not present

## 2022-01-07 NOTE — Progress Notes (Signed)
Pre Surgical Assessment: 5 M Walk Test ? ?20M=16.8f ? ?5 Meter Walk Test- trial 1: 10.54 seconds ?5 Meter Walk Test- trial 2: 19.01 seconds ?5 Meter Walk Test- trial 3: 17.42 seconds ?5 Meter Walk Test Average: 15.65 seconds ?  ?

## 2022-01-09 NOTE — Progress Notes (Signed)
Patient ID: Tommy Green, male   DOB: [birth year], 76 y.o.   MRN: [id number] ? [facility]IPLIN[facility]VE [facility] ?  ? ? ?   ?[address] ?      [place] [address] ?            [id number]   ?      ? ?CARDIOTHORACIC SURGERY CONSULTATION REPORT ? ?PCP is [name], DO ?Referring Provider is Lenna Sciara, MD ?Primary Cardiologist is Shirlee More, MD ? ?Reason for consultation:  Severe aortic stenosis ? ?HPI: ? ?The patient is a 76 year old gentleman with a history of hypertension, hypertriglyceridemia, type 2 diabetes, intracranial hemorrhage requiring craniotomy, prior stroke, multiple back surgeries with resultant left foot drop after his last surgery, history of rheumatic fever and aortic stenosis followed by Dr. [name].  He underwent spinal fusion surgery in April 2022 which according to the wife was a 12 hour surgery.  After surgery the patient developed hypotension requiring ICU care, Levophed, blood transfusions for significant blood loss, and fluid replacement.  An echocardiogram showed a calcified aortic valve with moderate to severe aortic stenosis and an LVEF of 55 to 60%.  He was referred to Dr. [name] for evaluation of aortic stenosis.  The patient reported a heart murmur for most of his life and having had rheumatic fever as a child.  At that time the patient and his wife are very hesitant to consider cardiac intervention since he was recovering from a complicated back surgery and was asymptomatic.  He had a repeat echocardiogram on 10/21/2021 showing severe aortic stenosis with ejection fraction of 50 to 55%. ? ?He presented to the emergency department on 12/09/2021 complaining of dizziness that was different than his prior vertigo.  He denied any shortness of breath or chest discomfort. He underwent cardiac catheterization on 12/11/2021 which showed moderate nonobstructive coronary disease.  Right heart pressures were normal.  He was  discharged home after his catheterization to complete TAVR work-up as an outpatient. ? ?He is here today with his wife.  He now reports progressive fatigue and exertional shortness of breath as well as occasional dizziness.  He has had lower extremity edema.  He walks with a rolling walker due to his left foot drop.. ? ?Past Medical History:  ?Diagnosis Date  ? Anemia   ? Essential hypertension 07/18/2017  ? Foot drop, left foot   ? due to back surgery in 01/2021  ? History of colonic polyps   ? History of rheumatic fever   ? History of stroke 07/18/2017  ? Hypertriglyceridemia 07/18/2017  ? Severe aortic stenosis   ? Type 2 diabetes mellitus with diabetic neuropathy, unspecified ([place]) 07/18/2017  ? ? ?Past Surgical History:  ?Procedure Laterality Date  ? COLONOSCOPY    ? around 2018 [facility]  ? COLONOSCOPY  09/02/2020  ? CRANIOTOMY Left 10/25/2019  ? Procedure: CRANIOTOMY HEMATOMA EVACUATION SUBDURAL;  Surgeon: Vallarie Mare, MD;  Location: [facility];  Service: Neurosurgery;  Laterality: Left;  ? ESOPHAGOGASTRODUODENOSCOPY    ? around 2018 with [facility]  ? FOOT CAPSULOTOMY Left 07/25/2008  ? Mid Foot #2 MPJ  ? Hammertoe Repair Left 07/25/2008  ? #2 toe  ? RIGHT/LEFT HEART CATH AND CORONARY ANGIOGRAPHY N/A 12/11/2021  ? Procedure: RIGHT/LEFT HEART CATH AND CORONARY ANGIOGRAPHY;  Surgeon: Jettie Booze, MD;  Location: [facility];  Service: Cardiovascular;  Laterality: N/A;  ? SPINAL FUSION  01/2021  ?  TARSAL TUNNEL RELEASE Left 07/25/2008  ? UPPER GASTROINTESTINAL ENDOSCOPY  09/02/2020  ? ? ?Family History  ?Problem Relation Age of Onset  ? Hyperlipidemia Mother   ? Colon polyps Mother   ? Hyperlipidemia Father   ? Alcoholism Brother   ? Healthy Son   ? Healthy Son   ? Cancer Neg Hx   ? Colon cancer Neg Hx   ? Esophageal cancer Neg Hx   ? Rectal cancer Neg Hx   ? Stomach cancer Neg Hx   ? ? ?Social History  ? ?Socioeconomic History  ? Marital status: Married  ?  Spouse name: Not on file  ?  Number of children: Not on file  ? Years of education: Not on file  ? Highest education level: Not on file  ?Occupational History  ? Not on file  ?Tobacco Use  ? Smoking status: Former  ?  Years: 10.00  ?  Types: Cigarettes  ?  Quit date: 69  ?  Years since quitting: 50.2  ? Smokeless tobacco: Never  ?Vaping Use  ? Vaping Use: Never used  ?Substance and Sexual Activity  ? Alcohol use: Yes  ?  Comment: occ beer  ? Drug use: No  ? Sexual activity: Yes  ?Other Topics Concern  ? Not on file  ?Social History Narrative  ? Not on file  ? ?Social Determinants of Health  ? ?Financial Resource Strain: Low Risk   ? Difficulty of Paying Living Expenses: Not very hard  ?Food Insecurity: Not on file  ?Transportation Needs: Not on file  ?Physical Activity: Insufficiently Active  ? Days of Exercise per Week: 2 days  ? Minutes of Exercise per Session: 30 min  ?Stress: Not on file  ?Social Connections: Not on file  ?Intimate Partner Violence: Not on file  ? ? ?Prior to Admission medications   ?Medication Sig Start Date End Date Taking? Authorizing Provider  ?acetaminophen (TYLENOL) 325 MG tablet Take 650 mg by mouth every 6 (six) hours as needed for mild pain or headache.   Yes [provider]  ?amLODipine (NORVASC) 5 MG tablet Take 1 tablet (5 mg total) by mouth daily. ?Patient taking differently: Take 5 mg by mouth at bedtime. 10/26/21  Yes [name], DO  ?aspirin EC 81 MG tablet Take 162 mg by mouth every 6 (six) hours as needed for mild pain (or headaches- Swallow whole).   Yes [provider]  ?B Complex Vitamins (B COMPLEX PO) Take 1 tablet by mouth daily.   Yes [provider]  ?Cholecalciferol (VITAMIN D3 SUPER STRENGTH) 50 MCG (2000 UT) CAPS Take 2,000 Units by mouth daily.   Yes [provider]  ?Etanercept (ENBREL MINI) 50 MG/ML SOCT Inject 50 mg into the skin once a week. ?Patient taking differently: Inject 50 mg into the skin every Monday. 09/17/21  Yes [name],  PA-C  ?ezetimibe (ZETIA) 10 MG tablet Take 1 tablet (10 mg total) by mouth daily. For elevated cholesterol and triglycerides 11/25/21  Yes Wendling, Crosby Oyster, DO  ?fenofibrate micronized (LOFIBRA) 200 MG capsule TAKE 1 CAPSULE DAILY BEFOREBREAKFAST 07/06/21  Yes Wendling, Crosby Oyster, DO  ?gabapentin (NEURONTIN) 100 MG capsule TAKE 2 CAPSULES AT BEDTIME ?Patient taking differently: Take 200 mg by mouth at bedtime. 07/13/21  Yes [name], DO  ?HYDROcodone-acetaminophen (NORCO) 10-325 MG tablet Take 0.5 tablets by mouth See admin instructions. Take 0.5 tablet by mouth at bedtime and an additional 0.5 tablet once a day as needed for  pain   Yes [provider]  ?ibuprofen (ADVIL) 200 MG tablet Take 600 mg by mouth every 6 (six) hours as needed for mild pain or headache.   Yes [provider]  ?insulin aspart (NOVOLOG FLEXPEN) 100 UNIT/ML FlexPen Inject 0-10 Units into the skin See admin instructions. Inject 0-10 units into the skin before meals, PER SLIDING SCALE: BGL 0-200 = give nothing, 201-250 = 2 units, 251-300 = 4 units, 301-350 = 6 units, 351-400 = 8 units, >400 = 10 units, push fluids, and repeat BGL check in 2 hours. If BGL remains >400 after 2 hours, CALL MD.   Yes [provider]  ?insulin degludec (TRESIBA) 100 UNIT/ML FlexTouch Pen Inject 30 Units into the skin at bedtime. 03/10/21  Yes Donne Hazel, MD  ?leflunomide (ARAVA) 10 MG tablet TAKE 1 TABLET BY MOUTH EVERY DAY 12/16/21  Yes [name], PA-C  ?lisinopril (ZESTRIL) 40 MG tablet Take 1 tablet (40 mg total) by mouth in the morning. 12/23/21  Yes [name], DO  ?magnesium oxide (MAG-OX) 400 MG tablet Take 400 mg by mouth daily with lunch.   Yes [provider]  ?melatonin 5 MG TABS Take 5 mg by mouth at bedtime.   Yes [provider]  ?Multiple Vitamins-Minerals (MULTIVITAMIN WITH MINERALS) tablet Take 1 tablet by mouth daily with breakfast.   Yes [provider]   ?Omega 3 1000 MG CAPS Take 1,000 mg by mouth daily with lunch.   Yes [provider]  ?omeprazole (PRILOSEC) 20 MG capsule Take 1 capsule (20 mg total) by mouth daily before breakfast. 05/25/21  Yes Liston Alba

## 2022-01-11 ENCOUNTER — Other Ambulatory Visit: Payer: Self-pay

## 2022-01-11 DIAGNOSIS — I350 Nonrheumatic aortic (valve) stenosis: Secondary | ICD-10-CM

## 2022-01-14 ENCOUNTER — Encounter: Payer: Medicare HMO | Admitting: Physical Therapy

## 2022-01-15 DIAGNOSIS — N1831 Chronic kidney disease, stage 3a: Secondary | ICD-10-CM | POA: Diagnosis not present

## 2022-01-15 DIAGNOSIS — E785 Hyperlipidemia, unspecified: Secondary | ICD-10-CM

## 2022-01-15 DIAGNOSIS — I129 Hypertensive chronic kidney disease with stage 1 through stage 4 chronic kidney disease, or unspecified chronic kidney disease: Secondary | ICD-10-CM | POA: Diagnosis not present

## 2022-01-15 DIAGNOSIS — E1140 Type 2 diabetes mellitus with diabetic neuropathy, unspecified: Secondary | ICD-10-CM | POA: Diagnosis not present

## 2022-01-15 DIAGNOSIS — Z794 Long term (current) use of insulin: Secondary | ICD-10-CM

## 2022-01-15 DIAGNOSIS — I152 Hypertension secondary to endocrine disorders: Secondary | ICD-10-CM | POA: Diagnosis not present

## 2022-01-15 DIAGNOSIS — E1122 Type 2 diabetes mellitus with diabetic chronic kidney disease: Secondary | ICD-10-CM | POA: Diagnosis not present

## 2022-01-19 ENCOUNTER — Ambulatory Visit: Payer: Medicare HMO

## 2022-01-21 ENCOUNTER — Ambulatory Visit: Payer: Medicare HMO | Admitting: Physical Therapy

## 2022-01-21 NOTE — Pre-Procedure Instructions (Signed)
Surgical Instructions ? ? ? Your procedure is scheduled on Tuesday, January 26, 2022 at 7:30 AM. ? Report to [facility] Main Entrance "A" at 5:30 A.M., then check in with the Admitting office. ? Call this number if you have problems the morning of surgery: ? [phone] ? ? If you have any questions prior to your surgery date call [phone]: Open Monday-Friday 8am-4pm ? ? ? Remember: ? Do not eat or drink after midnight the night before your surgery ?  ?Continue taking all other medications without change through the day before surgery. On the morning of surgery do not take any medications. ? ?STOP on 4/4 taking any Aleve, Naproxen, Ibuprofen, Motrin, Advil, Goody's, BC's, all herbal medications, fish oil, and all non-prescription vitamins.  ? ?WHAT DO I DO ABOUT MY DIABETES MEDICATION? ? ? ?Do not take your insulin aspart (NOVOLOG FLEXPEN) the night before surgery (4/10) or the morning of surgery (4/11). ? ?THE NIGHT BEFORE SURGERY, take 15 units of insulin degludec (TRESIBA) insulin.    ? ?If your CBG is greater than 220 mg/dL, you may take ? of your insulin aspart (NOVOLOG FLEXPEN). ? ? ?HOW TO MANAGE YOUR DIABETES ?BEFORE AND AFTER SURGERY ? ?Why is it important to control my blood sugar before and after surgery? ?Improving blood sugar levels before and after surgery helps healing and can limit problems. ?A way of improving blood sugar control is eating a healthy diet by: ? Eating less sugar and carbohydrates ? Increasing activity/exercise ? Talking with your doctor about reaching your blood sugar goals ?High blood sugars (greater than 180 mg/dL) can raise your risk of infections and slow your recovery, so you will need to focus on controlling your diabetes during the weeks before surgery. ?Make sure that the doctor who takes care of your diabetes knows about your planned surgery including the date and location. ? ?How do I manage my blood sugar before surgery? ?Check your blood sugar at least 4 times a day,  starting 2 days before surgery, to make sure that the level is not too high or low. ? ?Check your blood sugar the morning of your surgery when you wake up and every 2 hours until you get to the Short Stay unit. ? ?If your blood sugar is less than 70 mg/dL, you will need to treat for low blood sugar: ?Do not take insulin. ?Treat a low blood sugar (less than 70 mg/dL) with ? cup of clear juice (cranberry or apple), 4 glucose tablets, OR glucose gel. ?Recheck blood sugar in 15 minutes after treatment (to make sure it is greater than 70 mg/dL). If your blood sugar is not greater than 70 mg/dL on recheck, call [phone] for further instructions. ?Report your blood sugar to the short stay nurse when you get to Short Stay. ? ?If you are admitted to the hospital after surgery: ?Your blood sugar will be checked by the staff and you will probably be given insulin after surgery (instead of oral diabetes medicines) to make sure you have good blood sugar levels. ?The goal for blood sugar control after surgery is 80-180 mg/dL. ? ?         ?           ?Do NOT Smoke (Tobacco/Vaping) for 24 hours prior to your procedure. ? ?If you use a CPAP at night, you may bring your mask/headgear for your overnight stay. ?  ?Contacts, glasses, piercing's, hearing aid's, dentures or partials may not be worn into surgery, please bring  cases for these belongings.  ?  ?For patients admitted to the hospital, discharge time will be determined by your treatment team. ?  ?Patients discharged the day of surgery will not be allowed to drive home, and someone needs to stay with them for 24 hours. ? ?SURGICAL WAITING ROOM VISITATION ?Patients having surgery or a procedure may have two support people in the waiting area. ?Visitors may stay in the waiting area during the procedure and switch out with other visitors if needed. ?Children under the age of 24 must have an adult accompany them who is not the patient. ?If the patient needs to stay at the  hospital during part of their recovery, the visitor guidelines for inpatient rooms apply. ? ?Please refer to the [facility] website for the visitor guidelines for Inpatients (after your surgery is over and you are in a regular room).  ? ? ?Special instructions:   ?Tommy Green- Preparing For Surgery ? ?Before surgery, you can play an important role. Because skin is not sterile, your skin needs to be as free of germs as possible. You can reduce the number of germs on your skin by washing with CHG (chlorahexidine gluconate) Soap before surgery.  CHG is an antiseptic cleaner which kills germs and bonds with the skin to continue killing germs even after washing.   ? ?Oral Hygiene is also important to reduce your risk of infection.  Remember - BRUSH YOUR TEETH THE MORNING OF SURGERY WITH YOUR REGULAR TOOTHPASTE ? ?Please do not use if you have an allergy to CHG or antibacterial soaps. If your skin becomes reddened/irritated stop using the CHG.  ?Do not shave (including legs and underarms) for at least 48 hours prior to first CHG shower. It is OK to shave your face. ? ?Please follow these instructions carefully. ?  ?Shower the NIGHT BEFORE SURGERY and the MORNING OF SURGERY ? ?If you chose to wash your hair, wash your hair first as usual with your normal shampoo. ? ?After you shampoo, rinse your hair and body thoroughly to remove the shampoo. ? ?Use CHG Soap as you would any other liquid soap. You can apply CHG directly to the skin and wash gently with a scrungie or a clean washcloth.  ? ?Apply the CHG Soap to your body ONLY FROM THE NECK DOWN.  Do not use on open wounds or open sores. Avoid contact with your eyes, ears, mouth and genitals (private parts). Wash Face and genitals (private parts)  with your normal soap.  ? ?Wash thoroughly, paying special attention to the area where your surgery will be performed. ? ?Thoroughly rinse your body with warm water from the neck down. ? ?DO NOT shower/wash with your normal soap  after using and rinsing off the CHG Soap. ? ?Pat yourself dry with a CLEAN TOWEL. ? ?Wear CLEAN PAJAMAS to bed the night before surgery ? ?Place CLEAN SHEETS on your bed the night before your surgery ? ?DO NOT SLEEP WITH PETS. ? ? ?Day of Surgery: ?Take a shower with CHG soap. ?Do not wear jewelry. ?Do not wear lotions, powders, colognes, or deodorant. ?Do not shave 48 hours prior to surgery.  Men may shave face and neck. ?Do not bring valuables to the hospital.  ?[facility] is not responsible for any belongings or valuables. ?Wear Clean/Comfortable clothing the morning of surgery ?Do not apply any deodorants/lotions.   ?Remember to brush your teeth WITH YOUR REGULAR TOOTHPASTE. ?  ?Please read over the following fact sheets that you were  given. ? ?If you received a COVID test during your pre-op visit  it is requested that you wear a mask when out in public, stay away from anyone that may not be feeling well and notify your surgeon if you develop symptoms. If you have been in contact with anyone that has tested positive in the last 10 days please notify you surgeon. ?  ? ?

## 2022-01-22 ENCOUNTER — Encounter (HOSPITAL_COMMUNITY)
Admission: RE | Admit: 2022-01-22 | Discharge: 2022-01-22 | Disposition: A | Discharge status: Home / Self Care | Payer: Medicare HMO | Source: Ambulatory Visit | Attending: Internal Medicine | Admitting: Internal Medicine

## 2022-01-22 ENCOUNTER — Ambulatory Visit (HOSPITAL_COMMUNITY)
Admission: RE | Admit: 2022-01-22 | Discharge: 2022-01-22 | Disposition: A | Discharge status: Home / Self Care | Payer: Medicare HMO | Source: Ambulatory Visit | Attending: Internal Medicine | Admitting: Internal Medicine

## 2022-01-22 ENCOUNTER — Other Ambulatory Visit: Payer: Self-pay

## 2022-01-22 ENCOUNTER — Encounter (HOSPITAL_COMMUNITY): Payer: Self-pay

## 2022-01-22 VITALS — BP 138/69 | HR 70 | Temp 98.1°F | Resp 18 | Ht 71.0 in | Wt 231.1 lb

## 2022-01-22 DIAGNOSIS — Z01818 Encounter for other preprocedural examination: Secondary | ICD-10-CM

## 2022-01-22 DIAGNOSIS — I350 Nonrheumatic aortic (valve) stenosis: Secondary | ICD-10-CM | POA: Insufficient documentation

## 2022-01-22 DIAGNOSIS — M47814 Spondylosis without myelopathy or radiculopathy, thoracic region: Secondary | ICD-10-CM | POA: Diagnosis not present

## 2022-01-22 DIAGNOSIS — E119 Type 2 diabetes mellitus without complications: Secondary | ICD-10-CM | POA: Diagnosis not present

## 2022-01-22 DIAGNOSIS — E1165 Type 2 diabetes mellitus with hyperglycemia: Secondary | ICD-10-CM | POA: Insufficient documentation

## 2022-01-22 DIAGNOSIS — Z20822 Contact with and (suspected) exposure to covid-19: Secondary | ICD-10-CM | POA: Diagnosis not present

## 2022-01-22 HISTORY — DX: Gastro-esophageal reflux disease without esophagitis: K21.9

## 2022-01-22 HISTORY — DX: Personal history of other diseases of the digestive system: Z87.19

## 2022-01-22 LAB — HEMOGLOBIN A1C
Hgb A1c MFr Bld: 5.7 % — ABNORMAL HIGH (ref 4.8–5.6)
Mean Plasma Glucose: 116.89 mg/dL

## 2022-01-22 LAB — BLOOD GAS, ARTERIAL
Acid-base deficit: 1.7 mmol/L (ref 0.0–2.0)
Bicarbonate: 21.9 mmol/L (ref 20.0–28.0)
Drawn by: 602861
O2 Saturation: 97.9 %
Patient temperature: 37
pCO2 arterial: 33 mmHg (ref 32–48)
pH, Arterial: 7.43 (ref 7.35–7.45)
pO2, Arterial: 92 mmHg (ref 83–108)

## 2022-01-22 LAB — COMPREHENSIVE METABOLIC PANEL
ALT: 17 U/L (ref 0–44)
AST: 21 U/L (ref 15–41)
Albumin: 3.5 g/dL (ref 3.5–5.0)
Alkaline Phosphatase: 36 U/L — ABNORMAL LOW (ref 38–126)
Anion gap: 8 (ref 5–15)
BUN: 29 mg/dL — ABNORMAL HIGH (ref 8–23)
CO2: 19 mmol/L — ABNORMAL LOW (ref 22–32)
Calcium: 8.8 mg/dL — ABNORMAL LOW (ref 8.9–10.3)
Chloride: 111 mmol/L (ref 98–111)
Creatinine, Ser: 1.54 mg/dL — ABNORMAL HIGH (ref 0.61–1.24)
GFR, Estimated: 46 mL/min — ABNORMAL LOW (ref >60)
Glucose, Bld: 141 mg/dL — ABNORMAL HIGH (ref 70–99)
Potassium: 4.2 mmol/L (ref 3.5–5.1)
Sodium: 138 mmol/L (ref 135–145)
Total Bilirubin: 0.4 mg/dL (ref 0.3–1.2)
Total Protein: 6.3 g/dL — ABNORMAL LOW (ref 6.5–8.1)

## 2022-01-22 LAB — URINALYSIS, ROUTINE W REFLEX MICROSCOPIC
Bilirubin Urine: NEGATIVE
Glucose, UA: NEGATIVE mg/dL
Hgb urine dipstick: NEGATIVE
Ketones, ur: NEGATIVE mg/dL
Leukocytes,Ua: NEGATIVE
Nitrite: NEGATIVE
Protein, ur: NEGATIVE mg/dL
Specific Gravity, Urine: 1.005 (ref 1.005–1.030)
pH: 5 (ref 5.0–8.0)

## 2022-01-22 LAB — CBC
HCT: 28.4 % — ABNORMAL LOW (ref 39.0–52.0)
Hemoglobin: 8.9 g/dL — ABNORMAL LOW (ref 13.0–17.0)
MCH: 26.5 pg (ref 26.0–34.0)
MCHC: 31.3 g/dL (ref 30.0–36.0)
MCV: 84.5 fL (ref 80.0–100.0)
Platelets: 271 10*3/uL (ref 150–400)
RBC: 3.36 MIL/uL — ABNORMAL LOW (ref 4.22–5.81)
RDW: 17.2 % — ABNORMAL HIGH (ref 11.5–15.5)
WBC: 7.8 10*3/uL (ref 4.0–10.5)
nRBC: 0 % (ref 0.0–0.2)

## 2022-01-22 LAB — TYPE AND SCREEN
ABO/RH(D): O POS
Antibody Screen: NEGATIVE

## 2022-01-22 LAB — SURGICAL PCR SCREEN
MRSA, PCR: NEGATIVE
Staphylococcus aureus: NEGATIVE

## 2022-01-22 LAB — GLUCOSE, CAPILLARY: Glucose-Capillary: 131 mg/dL — ABNORMAL HIGH (ref 70–99)

## 2022-01-22 LAB — SARS CORONAVIRUS 2 (TAT 6-24 HRS): SARS Coronavirus 2: NEGATIVE

## 2022-01-22 LAB — PROTIME-INR
INR: 1 (ref 0.8–1.2)
Prothrombin Time: 13.1 seconds (ref 11.4–15.2)

## 2022-01-22 IMAGING — DX DG CHEST 2V
2 series · 2 of 2 positions shown · non-contrast
Comparison: [DATE]

CLINICAL DATA: 76-year-old male with a history of diabetes

EXAM:
CHEST - 2 VIEW

[w chest lat]
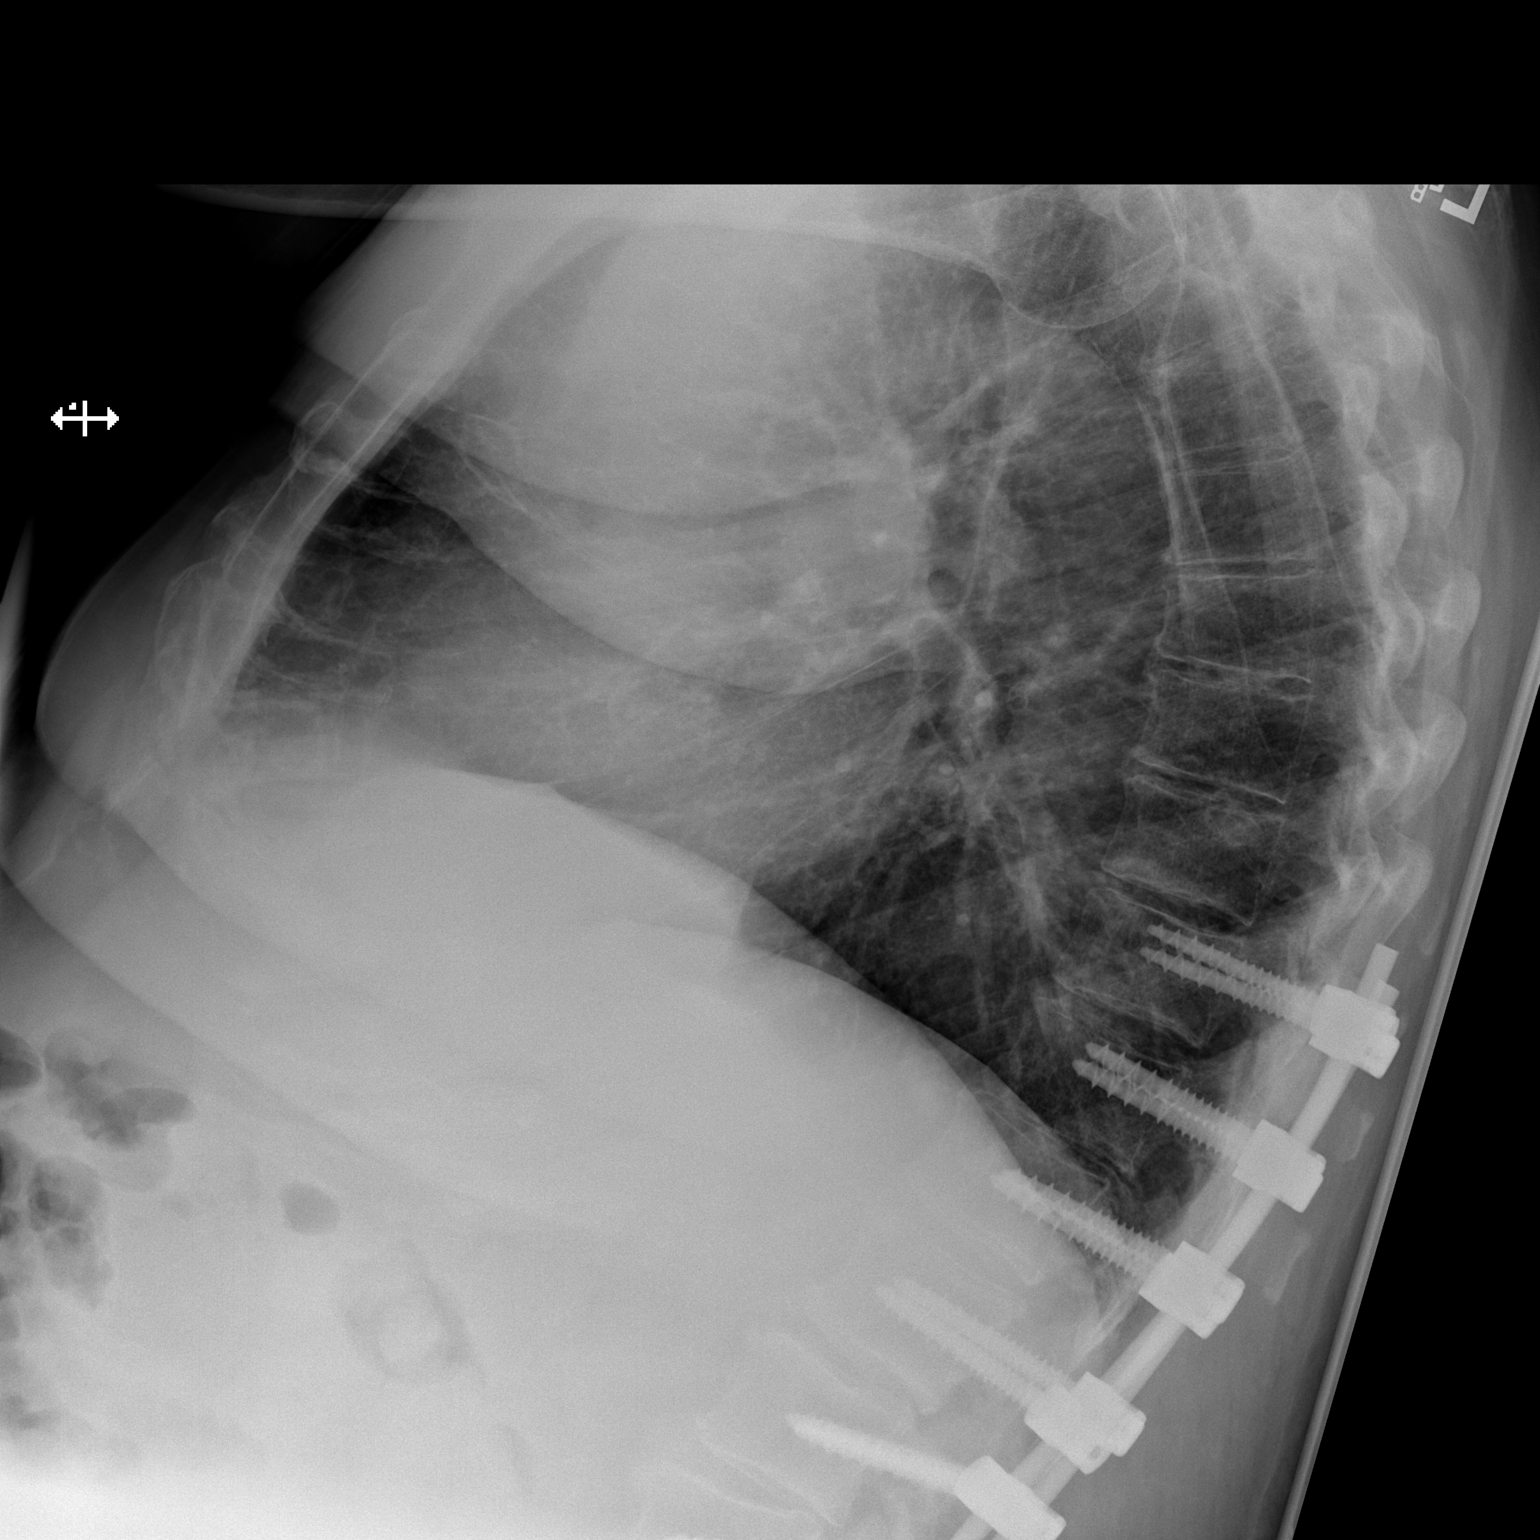

[x chest ap]
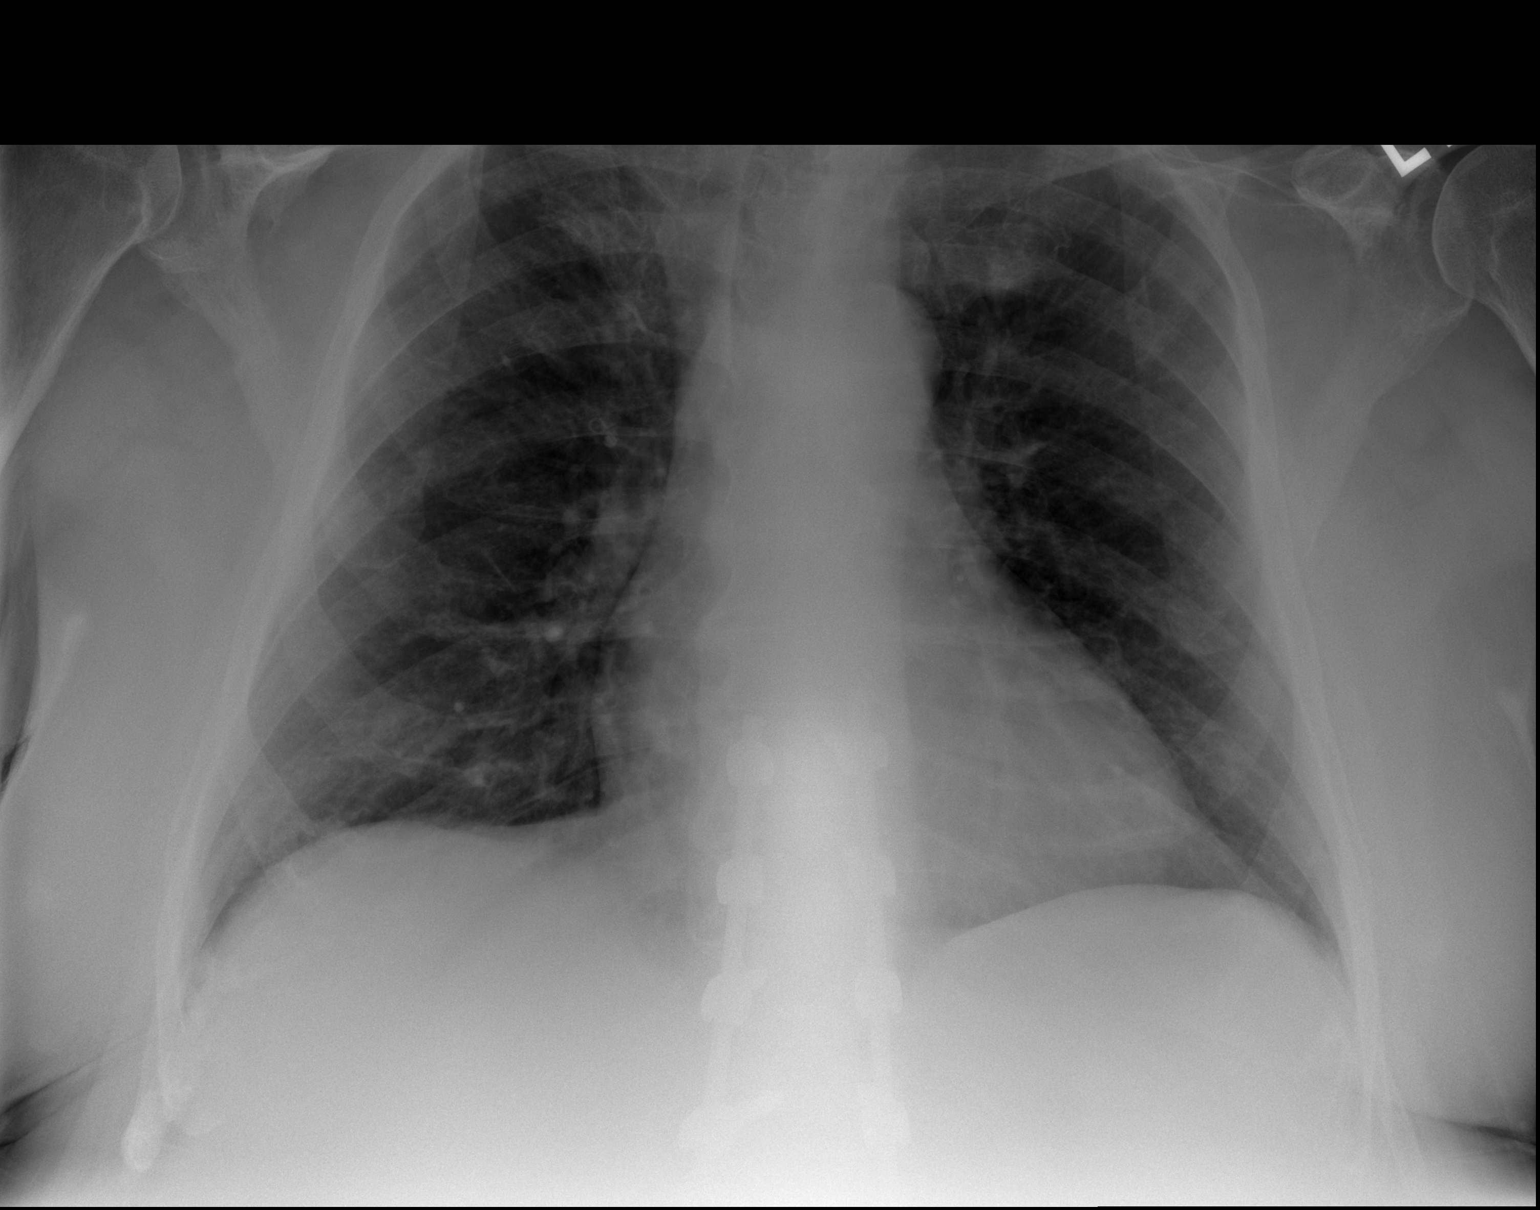

[2 of 2 positions shown; findings below may reference images not displayed]

FINDINGS: Cardiomediastinal silhouette unchanged in size and contour. No
evidence of central vascular congestion. No interlobular septal
thickening.

Coarsened interstitial markings are similar to the prior with no new
confluent airspace disease or significant progression.

No pneumothorax or pleural effusion.

No acute displaced fracture. Degenerative changes of the spine.

Surgical changes of the thoracolumbar region incompletely imaged.
IMPRESSION: Negative for acute cardiopulmonary disease

## 2022-01-22 NOTE — Progress Notes (Signed)
PCP - Dr. Riki Sheer ?Cardiologist - Dr. Shirlee More ? ?PPM/ICD - denies ? ? ?Chest x-ray - 01/22/22 ?EKG - 01/22/22 ?Stress Test - about 2 years ago per pt, positive for ischemia ?ECHO - 10/21/21 ?Cardiac Cath - 12/11/21 ? ?Sleep Study - 10+ years ago, OSA- per pt ? ?DM- Type 2 ?Fasting Blood Sugar - 120-130 ?Checks Blood Sugar 3-4 times a day ? ?ASA/Blood Thinner Instructions: Pt states he does not take ASA and has not taken it in a long time ? ? ?ERAS Protcol - no, NPO ? ?COVID TEST- 01/22/22 ? ? ?Anesthesia review: yes, cardiac hx ? ?Patient denies shortness of breath, fever, cough and chest pain at PAT appointment ? ? ?All instructions explained to the patient, with a verbal understanding of the material. Patient agrees to go over the instructions while at home for a better understanding. Patient also instructed to wear a mask in public after being tested for COVID-19. The opportunity to ask questions was provided. ?  ?

## 2022-01-25 MED ORDER — NOREPINEPHRINE 4 MG/250ML-% IV SOLN
0.0000 ug/min | INTRAVENOUS | Status: AC
  Administered 2022-01-26: 2 ug/min via INTRAVENOUS
  Filled 2022-01-25: qty 250

## 2022-01-25 MED ORDER — POTASSIUM CHLORIDE 2 MEQ/ML IV SOLN
80.0000 meq | INTRAVENOUS | Status: DC
Start: 2022-01-26 — End: 2022-01-26
  Filled 2022-01-25: qty 40

## 2022-01-25 MED ORDER — DEXMEDETOMIDINE HCL IN NACL 400 MCG/100ML IV SOLN
0.1000 ug/kg/h | INTRAVENOUS | Status: AC
  Administered 2022-01-26: 104.8 ug via INTRAVENOUS
  Administered 2022-01-26: 1 ug/kg/h via INTRAVENOUS
  Filled 2022-01-25: qty 100

## 2022-01-25 MED ORDER — HEPARIN 30,000 UNITS/1000 ML (OHS) CELLSAVER SOLUTION
Status: DC
  Filled 2022-01-25: qty 1000

## 2022-01-25 MED ORDER — CEFAZOLIN SODIUM-DEXTROSE 2-4 GM/100ML-% IV SOLN
2.0000 g | INTRAVENOUS | Status: AC
  Administered 2022-01-26: 2 g via INTRAVENOUS
  Filled 2022-01-25 (×2): qty 100

## 2022-01-25 MED ORDER — MAGNESIUM SULFATE 50 % IJ SOLN
40.0000 meq | INTRAMUSCULAR | Status: DC
  Filled 2022-01-25: qty 9.85

## 2022-01-25 NOTE — Anesthesia Preprocedure Evaluation (Addendum)
Anesthesia Evaluation  ?Patient identified by MRN, date of birth, ID band ?Patient awake ? ? ? ?Reviewed: ?Allergy & Precautions, NPO status , Patient's Chart, lab work & pertinent test results ? ?History of Anesthesia Complications ?Negative for: history of anesthetic complications ? ?Airway ?Mallampati: I ? ?TM Distance: >3 FB ?Neck ROM: Full ? ? ? Dental ? ?(+) Dental Advisory Given ?  ?Pulmonary ?former smoker,  ?01/22/2022 SARS coronavirus NEG ?  ?breath sounds clear to auscultation ? ? ? ? ? ? Cardiovascular ?hypertension, Pt. on medications ?(-) angina+ CAD (non-obstructive)  ?+ Valvular Problems/Murmurs (severe) AS  ?Rhythm:Regular Rate:Normal ?+ Systolic murmurs ?11/2021 cath:  ?   Ost LAD to Prox LAD lesion is 50% stenosed. ??  Mid LAD lesion is 50% stenosed. ??  Mid RCA lesion is 50% stenosed ? ?10/2021 ECHO: EF 50-55%. The LV has low normal function, no regional wall motion abnormalities. There is  ?mild concentric LVH. Grade I DD, normal RVF,  ?AV is tricuspid, with severe calcifcation,  moderate thickening of the aortic valve. AI is not visualized. Severe AS , with  Aortic valve mean gradient 44.8 mmHg, peak gradient 67.8 mmHg. Aortic valve area, by VTI measures 0.75 cm?.  ?  ?Neuro/Psych ?Glaucoma ?Chronic back pain: DDD ?CVA, No Residual Symptoms   ? GI/Hepatic ?Neg liver ROS, hiatal hernia, GERD  Medicated and Controlled,  ?Endo/Other  ?diabetes (glu 153), Insulin Dependent ? Renal/GU ?Renal InsufficiencyRenal disease  ? ?  ?Musculoskeletal ? ?(+) Arthritis ,  ? Abdominal ?(+) + obese,   ?Peds ? Hematology ? ?(+) Blood dyscrasia (Hb 8.9), anemia ,   ?Anesthesia Other Findings ? ? Reproductive/Obstetrics ? ?  ? ? ? ? ? ? ? ? ? ? ? ? ? ?  ?  ? ? ? ? ? ? ? ?Anesthesia Physical ?Anesthesia Plan ? ?ASA: 4 ? ?Anesthesia Plan: MAC  ? ?Post-op Pain Management: Tylenol PO (pre-op)* and Minimal or no pain anticipated  ? ?Induction:  ? ?PONV Risk Score and Plan: 1 and Ondansetron  and Treatment may vary due to age or medical condition ? ?Airway Management Planned: Natural Airway and Simple Face Mask ? ?Additional Equipment: Arterial line ? ?Intra-op Plan:  ? ?Post-operative Plan:  ? ?Informed Consent: I have reviewed the patients History and Physical, chart, labs and discussed the procedure including the risks, benefits and alternatives for the proposed anesthesia with the patient or authorized representative who has indicated his/her understanding and acceptance.  ? ? ? ?Dental advisory given ? ?Plan Discussed with: CRNA and Surgeon ? ?Anesthesia Plan Comments:   ? ? ? ? ? ?Anesthesia Quick Evaluation ? ?

## 2022-01-25 NOTE — H&P (Signed)
? ?   ?[address] ?      [place] [address] ?            [phone]   ? ?  ?Cardiothoracic Surgery Admission History and Physical ? ? ?PCP is [name], DO ?Referring Provider is Lenna Sciara, MD ?Primary Cardiologist is Shirlee More, MD ?  ?Reason for admission:  Severe aortic stenosis ?  ?HPI: ?  ?The patient is a 76 year old gentleman with a history of hypertension, hypertriglyceridemia, type 2 diabetes, intracranial hemorrhage requiring craniotomy, prior stroke, multiple back surgeries with resultant left foot drop after his last surgery, history of rheumatic fever and aortic stenosis followed by Dr. [name].  He underwent spinal fusion surgery in April 2022 which according to the wife was a 12 hour surgery.  After surgery the patient developed hypotension requiring ICU care, Levophed, blood transfusions for significant blood loss, and fluid replacement.  An echocardiogram showed a calcified aortic valve with moderate to severe aortic stenosis and an LVEF of 55 to 60%.  He was referred to Dr. [name] for evaluation of aortic stenosis.  The patient reported a heart murmur for most of his life and having had rheumatic fever as a child.  At that time the patient and his wife are very hesitant to consider cardiac intervention since he was recovering from a complicated back surgery and was asymptomatic.  He had a repeat echocardiogram on 10/21/2021 showing severe aortic stenosis with ejection fraction of 50 to 55%. ?  ?He presented to the emergency department on 12/09/2021 complaining of dizziness that was different than his prior vertigo.  He denied any shortness of breath or chest discomfort. He underwent cardiac catheterization on 12/11/2021 which showed moderate nonobstructive coronary disease.  Right heart pressures were normal.  He was discharged home after his catheterization to complete TAVR work-up as an outpatient. ?  ?He lives with his wife.  He now reports progressive  fatigue and exertional shortness of breath as well as occasional dizziness.  He has had lower extremity edema.  He walks with a rolling walker due to his left foot drop.. ?  ?    ?Past Medical History:  ?Diagnosis Date  ? Anemia    ? Essential hypertension 07/18/2017  ? Foot drop, left foot    ?  due to back surgery in 01/2021  ? History of colonic polyps    ? History of rheumatic fever    ? History of stroke 07/18/2017  ? Hypertriglyceridemia 07/18/2017  ? Severe aortic stenosis    ? Type 2 diabetes mellitus with diabetic neuropathy, unspecified ([place]) 07/18/2017  ?  ?  ?     ?Past Surgical History:  ?Procedure Laterality Date  ? COLONOSCOPY      ?  around 2018 [facility]  ? COLONOSCOPY   09/02/2020  ? CRANIOTOMY Left 10/25/2019  ?  Procedure: CRANIOTOMY HEMATOMA EVACUATION SUBDURAL;  Surgeon: Vallarie Mare, MD;  Location: [facility];  Service: Neurosurgery;  Laterality: Left;  ? ESOPHAGOGASTRODUODENOSCOPY      ?  around 2018 with [facility]  ? FOOT CAPSULOTOMY Left 07/25/2008  ?  Mid Foot #2 MPJ  ? Hammertoe Repair Left 07/25/2008  ?  #2 toe  ? RIGHT/LEFT HEART CATH AND CORONARY ANGIOGRAPHY N/A 12/11/2021  ?  Procedure: RIGHT/LEFT HEART CATH AND CORONARY ANGIOGRAPHY;  Surgeon: Jettie Booze, MD;  Location: [facility];  Service: Cardiovascular;  Laterality: N/A;  ? SPINAL FUSION  01/2021  ? TARSAL TUNNEL RELEASE Left 07/25/2008  ? UPPER GASTROINTESTINAL ENDOSCOPY   09/02/2020  ?  ?  ?     ?Family History  ?Problem Relation Age of Onset  ? Hyperlipidemia Mother    ? Colon polyps Mother    ? Hyperlipidemia Father    ? Alcoholism Brother    ? Healthy Son    ? Healthy Son    ? Cancer Neg Hx    ? Colon cancer Neg Hx    ? Esophageal cancer Neg Hx    ? Rectal cancer Neg Hx    ? Stomach cancer Neg Hx    ?  ?  ?Social History  ?  ?     ?Socioeconomic History  ? Marital status: Married  ?    Spouse name: Not on file  ? Number of children: Not on file  ? Years of education: Not on file  ? Highest education  level: Not on file  ?Occupational History  ? Not on file  ?Tobacco Use  ? Smoking status: Former  ?    Years: 10.00  ?    Types: Cigarettes  ?    Quit date: 63  ?    Years since quitting: 50.2  ? Smokeless tobacco: Never  ?Vaping Use  ? Vaping Use: Never used  ?Substance and Sexual Activity  ? Alcohol use: Yes  ?    Comment: occ beer  ? Drug use: No  ? Sexual activity: Yes  ?Other Topics Concern  ? Not on file  ?Social History Narrative  ? Not on file  ?  ?Social Determinants of Health  ?  ?   ?Financial Resource Strain: Low Risk   ? Difficulty of Paying Living Expenses: Not very hard  ?Food Insecurity: Not on file  ?Transportation Needs: Not on file  ?Physical Activity: Insufficiently Active  ? Days of Exercise per Week: 2 days  ? Minutes of Exercise per Session: 30 min  ?Stress: Not on file  ?Social Connections: Not on file  ?Intimate Partner Violence: Not on file  ?  ?  ?       ?Prior to Admission medications   ?Medication Sig Start Date End Date Taking? Authorizing Provider  ?acetaminophen (TYLENOL) 325 MG tablet Take 650 mg by mouth every 6 (six) hours as needed for mild pain or headache.     Yes [provider]  ?amLODipine (NORVASC) 5 MG tablet Take 1 tablet (5 mg total) by mouth daily. ?Patient taking differently: Take 5 mg by mouth at bedtime. 10/26/21   Yes [name], DO  ?aspirin EC 81 MG tablet Take 162 mg by mouth every 6 (six) hours as needed for mild pain (or headaches- Swallow whole).     Yes [provider]  ?B Complex Vitamins (B COMPLEX PO) Take 1 tablet by mouth daily.     Yes [provider]  ?Cholecalciferol (VITAMIN D3 SUPER STRENGTH) 50 MCG (2000 UT) CAPS Take 2,000 Units by mouth daily.     Yes [provider]  ?Etanercept (ENBREL MINI) 50 MG/ML SOCT Inject 50 mg into the skin once a week. ?Patient taking differently: Inject 50 mg into the skin every Monday. 09/17/21   Yes [name], PA-C  ?ezetimibe (ZETIA) 10 MG tablet Take 1 tablet (10  mg total) by mouth daily. For elevated cholesterol and triglycerides 11/25/21   Yes Wendling, Crosby Oyster, DO  ?fenofibrate micronized (LOFIBRA) 200 MG capsule TAKE 1 CAPSULE DAILY BEFOREBREAKFAST  07/06/21   Yes [name], DO  ?gabapentin (NEURONTIN) 100 MG capsule TAKE 2 CAPSULES AT BEDTIME ?Patient taking differently: Take 200 mg by mouth at bedtime. 07/13/21   Yes [name], DO  ?HYDROcodone-acetaminophen (NORCO) 10-325 MG tablet Take 0.5 tablets by mouth See admin instructions. Take 0.5 tablet by mouth at bedtime and an additional 0.5 tablet once a day as needed for pain     Yes [provider]  ?ibuprofen (ADVIL) 200 MG tablet Take 600 mg by mouth every 6 (six) hours as needed for mild pain or headache.     Yes [provider]  ?insulin aspart (NOVOLOG FLEXPEN) 100 UNIT/ML FlexPen Inject 0-10 Units into the skin See admin instructions. Inject 0-10 units into the skin before meals, PER SLIDING SCALE: BGL 0-200 = give nothing, 201-250 = 2 units, 251-300 = 4 units, 301-350 = 6 units, 351-400 = 8 units, >400 = 10 units, push fluids, and repeat BGL check in 2 hours. If BGL remains >400 after 2 hours, CALL MD.     Yes [provider]  ?insulin degludec (TRESIBA) 100 UNIT/ML FlexTouch Pen Inject 30 Units into the skin at bedtime. 03/10/21   Yes Donne Hazel, MD  ?leflunomide (ARAVA) 10 MG tablet TAKE 1 TABLET BY MOUTH EVERY DAY 12/16/21   Yes [name], PA-C  ?lisinopril (ZESTRIL) 40 MG tablet Take 1 tablet (40 mg total) by mouth in the morning. 12/23/21   Yes [name], DO  ?magnesium oxide (MAG-OX) 400 MG tablet Take 400 mg by mouth daily with lunch.     Yes [provider]  ?melatonin 5 MG TABS Take 5 mg by mouth at bedtime.     Yes [provider]  ?Multiple Vitamins-Minerals (MULTIVITAMIN WITH MINERALS) tablet Take 1 tablet by mouth daily with breakfast.     Yes [provider]  ?Omega 3 1000 MG CAPS Take 1,000 mg by  mouth daily with lunch.     Yes [provider]  ?omeprazole (PRILOSEC) 20 MG capsule Take 1 capsule (20 mg total) by mouth daily before breakfast. 05/25/21   Yes Wendling, Crosby Oyster, DO  ?Londell Moh

## 2022-01-26 ENCOUNTER — Inpatient Hospital Stay (HOSPITAL_COMMUNITY): Payer: Medicare HMO | Admitting: Vascular Surgery

## 2022-01-26 ENCOUNTER — Other Ambulatory Visit: Payer: Self-pay

## 2022-01-26 ENCOUNTER — Other Ambulatory Visit: Payer: Self-pay | Admitting: Cardiology

## 2022-01-26 ENCOUNTER — Inpatient Hospital Stay (HOSPITAL_COMMUNITY): Payer: Medicare HMO | Admitting: Certified Registered Nurse Anesthetist

## 2022-01-26 ENCOUNTER — Inpatient Hospital Stay (HOSPITAL_COMMUNITY)
Admission: RE | Admit: 2022-01-26 | Discharge: 2022-01-26 | Disposition: A | Discharge status: Home / Self Care | Payer: Medicare HMO | Source: Home / Self Care | Attending: Internal Medicine | Admitting: Internal Medicine

## 2022-01-26 ENCOUNTER — Encounter (HOSPITAL_COMMUNITY): Payer: Self-pay | Admitting: Internal Medicine

## 2022-01-26 ENCOUNTER — Encounter (HOSPITAL_COMMUNITY)
Admission: RE | Disposition: A | Discharge status: Home / Self Care | Payer: Medicare HMO | Source: Home / Self Care | Attending: Internal Medicine

## 2022-01-26 ENCOUNTER — Inpatient Hospital Stay (HOSPITAL_COMMUNITY)
Admission: RE | Admit: 2022-01-26 | Discharge: 2022-01-27 | DRG: 267 | Disposition: A | Discharge status: Home / Self Care | Payer: Medicare HMO | Attending: Internal Medicine | Admitting: Internal Medicine

## 2022-01-26 DIAGNOSIS — E1122 Type 2 diabetes mellitus with diabetic chronic kidney disease: Secondary | ICD-10-CM | POA: Diagnosis present

## 2022-01-26 DIAGNOSIS — Z888 Allergy status to other drugs, medicaments and biological substances status: Secondary | ICD-10-CM

## 2022-01-26 DIAGNOSIS — I2510 Atherosclerotic heart disease of native coronary artery without angina pectoris: Secondary | ICD-10-CM | POA: Diagnosis present

## 2022-01-26 DIAGNOSIS — E119 Type 2 diabetes mellitus without complications: Secondary | ICD-10-CM | POA: Diagnosis not present

## 2022-01-26 DIAGNOSIS — E1140 Type 2 diabetes mellitus with diabetic neuropathy, unspecified: Secondary | ICD-10-CM | POA: Diagnosis not present

## 2022-01-26 DIAGNOSIS — Z952 Presence of prosthetic heart valve: Principal | ICD-10-CM

## 2022-01-26 DIAGNOSIS — Z79899 Other long term (current) drug therapy: Secondary | ICD-10-CM

## 2022-01-26 DIAGNOSIS — Z794 Long term (current) use of insulin: Secondary | ICD-10-CM

## 2022-01-26 DIAGNOSIS — D631 Anemia in chronic kidney disease: Secondary | ICD-10-CM | POA: Diagnosis not present

## 2022-01-26 DIAGNOSIS — M21372 Foot drop, left foot: Secondary | ICD-10-CM | POA: Diagnosis not present

## 2022-01-26 DIAGNOSIS — E785 Hyperlipidemia, unspecified: Secondary | ICD-10-CM | POA: Diagnosis present

## 2022-01-26 DIAGNOSIS — I350 Nonrheumatic aortic (valve) stenosis: Secondary | ICD-10-CM

## 2022-01-26 DIAGNOSIS — D649 Anemia, unspecified: Secondary | ICD-10-CM

## 2022-01-26 DIAGNOSIS — E669 Obesity, unspecified: Secondary | ICD-10-CM | POA: Diagnosis present

## 2022-01-26 DIAGNOSIS — E781 Pure hyperglyceridemia: Secondary | ICD-10-CM | POA: Diagnosis present

## 2022-01-26 DIAGNOSIS — I10 Essential (primary) hypertension: Secondary | ICD-10-CM | POA: Diagnosis not present

## 2022-01-26 DIAGNOSIS — Z886 Allergy status to analgesic agent status: Secondary | ICD-10-CM

## 2022-01-26 DIAGNOSIS — Z83438 Family history of other disorder of lipoprotein metabolism and other lipidemia: Secondary | ICD-10-CM

## 2022-01-26 DIAGNOSIS — Z85828 Personal history of other malignant neoplasm of skin: Secondary | ICD-10-CM | POA: Diagnosis not present

## 2022-01-26 DIAGNOSIS — Z006 Encounter for examination for normal comparison and control in clinical research program: Secondary | ICD-10-CM | POA: Diagnosis not present

## 2022-01-26 DIAGNOSIS — N1831 Chronic kidney disease, stage 3a: Secondary | ICD-10-CM | POA: Diagnosis not present

## 2022-01-26 DIAGNOSIS — Z7982 Long term (current) use of aspirin: Secondary | ICD-10-CM | POA: Diagnosis not present

## 2022-01-26 DIAGNOSIS — Z981 Arthrodesis status: Secondary | ICD-10-CM | POA: Diagnosis not present

## 2022-01-26 DIAGNOSIS — E1169 Type 2 diabetes mellitus with other specified complication: Secondary | ICD-10-CM | POA: Diagnosis present

## 2022-01-26 DIAGNOSIS — Z87891 Personal history of nicotine dependence: Secondary | ICD-10-CM | POA: Diagnosis not present

## 2022-01-26 DIAGNOSIS — I129 Hypertensive chronic kidney disease with stage 1 through stage 4 chronic kidney disease, or unspecified chronic kidney disease: Secondary | ICD-10-CM | POA: Diagnosis not present

## 2022-01-26 DIAGNOSIS — N183 Chronic kidney disease, stage 3 unspecified: Secondary | ICD-10-CM | POA: Diagnosis present

## 2022-01-26 DIAGNOSIS — Z8673 Personal history of transient ischemic attack (TIA), and cerebral infarction without residual deficits: Secondary | ICD-10-CM | POA: Diagnosis not present

## 2022-01-26 HISTORY — PX: INTRAOPERATIVE TRANSTHORACIC ECHOCARDIOGRAM: SHX6523

## 2022-01-26 HISTORY — DX: Presence of prosthetic heart valve: Z95.2

## 2022-01-26 HISTORY — PX: TRANSCATHETER AORTIC VALVE REPLACEMENT, TRANSFEMORAL: SHX6400

## 2022-01-26 LAB — ECHOCARDIOGRAM LIMITED
AR max vel: 4.54 cm2
AV Area VTI: 4.46 cm2
AV Area mean vel: 4.23 cm2
AV Mean grad: 4 mmHg
AV Peak grad: 9.5 mmHg
Ao pk vel: 1.55 m/s
S' Lateral: 3.2 cm

## 2022-01-26 LAB — POCT I-STAT, CHEM 8
BUN: 26 mg/dL — ABNORMAL HIGH (ref 8–23)
BUN: 26 mg/dL — ABNORMAL HIGH (ref 8–23)
Calcium, Ion: 1.12 mmol/L — ABNORMAL LOW (ref 1.15–1.40)
Calcium, Ion: 1.15 mmol/L (ref 1.15–1.40)
Chloride: 109 mmol/L (ref 98–111)
Chloride: 109 mmol/L (ref 98–111)
Creatinine, Ser: 1.5 mg/dL — ABNORMAL HIGH (ref 0.61–1.24)
Creatinine, Ser: 1.5 mg/dL — ABNORMAL HIGH (ref 0.61–1.24)
Glucose, Bld: 142 mg/dL — ABNORMAL HIGH (ref 70–99)
Glucose, Bld: 155 mg/dL — ABNORMAL HIGH (ref 70–99)
HCT: 25 % — ABNORMAL LOW (ref 39.0–52.0)
HCT: 22 % — ABNORMAL LOW (ref 39.0–52.0)
Hemoglobin: 8.5 g/dL — ABNORMAL LOW (ref 13.0–17.0)
Hemoglobin: 7.5 g/dL — ABNORMAL LOW (ref 13.0–17.0)
Potassium: 4.1 mmol/L (ref 3.5–5.1)
Potassium: 4.1 mmol/L (ref 3.5–5.1)
Sodium: 141 mmol/L (ref 135–145)
Sodium: 141 mmol/L (ref 135–145)
TCO2: 23 mmol/L (ref 22–32)
TCO2: 22 mmol/L (ref 22–32)

## 2022-01-26 LAB — GLUCOSE, CAPILLARY
Glucose-Capillary: 153 mg/dL — ABNORMAL HIGH (ref 70–99)
Glucose-Capillary: 122 mg/dL — ABNORMAL HIGH (ref 70–99)
Glucose-Capillary: 147 mg/dL — ABNORMAL HIGH (ref 70–99)
Glucose-Capillary: 104 mg/dL — ABNORMAL HIGH (ref 70–99)

## 2022-01-26 SURGERY — IMPLANTATION, AORTIC VALVE, TRANSCATHETER, FEMORAL APPROACH
Anesthesia: Monitor Anesthesia Care

## 2022-01-26 MED ORDER — HEPARIN 6000 UNIT IRRIGATION SOLUTION
Status: AC
  Filled 2022-01-26: qty 1500

## 2022-01-26 MED ORDER — 0.9 % SODIUM CHLORIDE (POUR BTL) OPTIME
TOPICAL | Status: DC | PRN
  Administered 2022-01-26: 1000 mL

## 2022-01-26 MED ORDER — OXYCODONE HCL 5 MG PO TABS
5.0000 mg | ORAL_TABLET | ORAL | Status: DC | PRN
  Filled 2022-01-26: qty 1

## 2022-01-26 MED ORDER — FENTANYL CITRATE (PF) 250 MCG/5ML IJ SOLN
INTRAMUSCULAR | Status: AC
Start: 2022-01-26 — End: ?
  Filled 2022-01-26: qty 5

## 2022-01-26 MED ORDER — PROPOFOL 10 MG/ML IV BOLUS
INTRAVENOUS | Status: AC
  Filled 2022-01-26: qty 20

## 2022-01-26 MED ORDER — PROPOFOL 500 MG/50ML IV EMUL
INTRAVENOUS | Status: AC
  Filled 2022-01-26: qty 100

## 2022-01-26 MED ORDER — MORPHINE SULFATE (PF) 2 MG/ML IV SOLN: 1.0000 mg | INTRAVENOUS | Status: DC | PRN

## 2022-01-26 MED ORDER — SODIUM CHLORIDE 0.9 % IV SOLN: 250.0000 mL | INTRAVENOUS | Status: DC

## 2022-01-26 MED ORDER — LEFLUNOMIDE 20 MG PO TABS
10.0000 mg | ORAL_TABLET | Freq: Every day | ORAL | Status: DC
  Administered 2022-01-26 – 2022-01-27 (×2): 10 mg via ORAL
  Filled 2022-01-26 (×2): qty 0.5

## 2022-01-26 MED ORDER — LACTATED RINGERS IV SOLN: INTRAVENOUS | Status: DC | PRN

## 2022-01-26 MED ORDER — INSULIN ASPART 100 UNIT/ML IJ SOLN
0.0000 [IU] | INTRAMUSCULAR | Status: DC
  Administered 2022-01-27: 4 [IU] via SUBCUTANEOUS
  Administered 2022-01-27 (×2): 2 [IU] via SUBCUTANEOUS

## 2022-01-26 MED ORDER — SODIUM CHLORIDE 0.9% FLUSH
3.0000 mL | Freq: Two times a day (BID) | INTRAVENOUS | Status: DC
  Administered 2022-01-26: 3 mL via INTRAVENOUS

## 2022-01-26 MED ORDER — HEPARIN SODIUM (PORCINE) 1000 UNIT/ML IJ SOLN
INTRAMUSCULAR | Status: AC
  Filled 2022-01-26: qty 1

## 2022-01-26 MED ORDER — IODIXANOL 320 MG/ML IV SOLN
INTRAVENOUS | Status: DC | PRN
  Administered 2022-01-26: 120 mL via INTRA_ARTERIAL

## 2022-01-26 MED ORDER — SODIUM CHLORIDE 0.9% FLUSH: 3.0000 mL | INTRAVENOUS | Status: DC | PRN

## 2022-01-26 MED ORDER — ASPIRIN 81 MG PO CHEW
81.0000 mg | CHEWABLE_TABLET | Freq: Every day | ORAL | Status: DC
  Administered 2022-01-27: 81 mg via ORAL
  Filled 2022-01-26: qty 1

## 2022-01-26 MED ORDER — ACETAMINOPHEN 650 MG RE SUPP: 650.0000 mg | Freq: Four times a day (QID) | RECTAL | Status: DC | PRN

## 2022-01-26 MED ORDER — GABAPENTIN 100 MG PO CAPS
200.0000 mg | ORAL_CAPSULE | Freq: Every day | ORAL | Status: DC
  Administered 2022-01-26: 200 mg via ORAL
  Filled 2022-01-26: qty 2

## 2022-01-26 MED ORDER — SODIUM CHLORIDE 0.9 % IV SOLN: INTRAVENOUS | Status: DC

## 2022-01-26 MED ORDER — SODIUM CHLORIDE 0.9 % IV SOLN
250.0000 mL | INTRAVENOUS | Status: DC | PRN
Start: 2022-01-26 — End: 2022-01-27

## 2022-01-26 MED ORDER — SODIUM CHLORIDE 0.9 % IV SOLN: INTRAVENOUS | Status: AC

## 2022-01-26 MED ORDER — FENTANYL CITRATE (PF) 250 MCG/5ML IJ SOLN
INTRAMUSCULAR | Status: DC | PRN
  Administered 2022-01-26: 25 ug via INTRAVENOUS

## 2022-01-26 MED ORDER — ORAL CARE MOUTH RINSE: 15.0000 mL | Freq: Once | OROMUCOSAL | Status: AC

## 2022-01-26 MED ORDER — TRAMADOL HCL 50 MG PO TABS: 50.0000 mg | ORAL_TABLET | ORAL | Status: DC | PRN

## 2022-01-26 MED ORDER — ACETAMINOPHEN 500 MG PO TABS
1000.0000 mg | ORAL_TABLET | Freq: Once | ORAL | Status: AC
  Administered 2022-01-26: 1000 mg via ORAL
  Filled 2022-01-26: qty 2

## 2022-01-26 MED ORDER — CHLORHEXIDINE GLUCONATE 0.12 % MT SOLN: 15.0000 mL | Freq: Once | OROMUCOSAL | Status: DC

## 2022-01-26 MED ORDER — NITROGLYCERIN IN D5W 200-5 MCG/ML-% IV SOLN: 0.0000 ug/min | INTRAVENOUS | Status: DC

## 2022-01-26 MED ORDER — CHLORHEXIDINE GLUCONATE 4 % EX LIQD: 60.0000 mL | Freq: Once | CUTANEOUS | Status: DC

## 2022-01-26 MED ORDER — LIDOCAINE HCL (PF) 1 % IJ SOLN
INTRAMUSCULAR | Status: DC | PRN
  Administered 2022-01-26: 6 mL

## 2022-01-26 MED ORDER — LIDOCAINE 2% (20 MG/ML) 5 ML SYRINGE
INTRAMUSCULAR | Status: DC | PRN
  Administered 2022-01-26: 60 mg via INTRAVENOUS

## 2022-01-26 MED ORDER — CEFAZOLIN SODIUM-DEXTROSE 2-4 GM/100ML-% IV SOLN
2.0000 g | Freq: Three times a day (TID) | INTRAVENOUS | Status: AC
  Administered 2022-01-26 (×2): 2 g via INTRAVENOUS
  Filled 2022-01-26 (×2): qty 100

## 2022-01-26 MED ORDER — ONDANSETRON HCL 4 MG/2ML IJ SOLN
INTRAMUSCULAR | Status: DC | PRN
  Administered 2022-01-26: 4 mg via INTRAVENOUS

## 2022-01-26 MED ORDER — INSULIN ASPART 100 UNIT/ML IJ SOLN: 0.0000 [IU] | INTRAMUSCULAR | Status: DC | PRN

## 2022-01-26 MED ORDER — HEPARIN SODIUM (PORCINE) 1000 UNIT/ML IJ SOLN
INTRAMUSCULAR | Status: DC | PRN
  Administered 2022-01-26: 15000 [IU] via INTRAVENOUS

## 2022-01-26 MED ORDER — ACETAMINOPHEN 325 MG PO TABS: 650.0000 mg | ORAL_TABLET | Freq: Four times a day (QID) | ORAL | Status: DC | PRN

## 2022-01-26 MED ORDER — PROPOFOL 10 MG/ML IV BOLUS
INTRAVENOUS | Status: DC | PRN
  Administered 2022-01-26: 20 mg via INTRAVENOUS
  Administered 2022-01-26: 10 mg via INTRAVENOUS
  Administered 2022-01-26: 20 mg via INTRAVENOUS

## 2022-01-26 MED ORDER — PROTAMINE SULFATE 10 MG/ML IV SOLN
INTRAVENOUS | Status: DC | PRN
  Administered 2022-01-26: 20 mg via INTRAVENOUS
  Administered 2022-01-26: 130 mg via INTRAVENOUS

## 2022-01-26 MED ORDER — PROPOFOL 500 MG/50ML IV EMUL
INTRAVENOUS | Status: DC | PRN
  Administered 2022-01-26: 10 ug/kg/min via INTRAVENOUS

## 2022-01-26 MED ORDER — HEPARIN 6000 UNIT IRRIGATION SOLUTION
Status: DC | PRN
  Administered 2022-01-26: 1

## 2022-01-26 MED ORDER — ONDANSETRON HCL 4 MG/2ML IJ SOLN: 4.0000 mg | Freq: Four times a day (QID) | INTRAMUSCULAR | Status: DC | PRN

## 2022-01-26 MED ORDER — CHLORHEXIDINE GLUCONATE 0.12 % MT SOLN
15.0000 mL | Freq: Once | OROMUCOSAL | Status: AC
  Administered 2022-01-26: 15 mL via OROMUCOSAL
  Filled 2022-01-26: qty 15

## 2022-01-26 MED ORDER — CHLORHEXIDINE GLUCONATE 4 % EX LIQD: 30.0000 mL | CUTANEOUS | Status: DC

## 2022-01-26 MED ORDER — LIDOCAINE HCL (PF) 1 % IJ SOLN
INTRAMUSCULAR | Status: AC
  Filled 2022-01-26: qty 30

## 2022-01-26 MED ORDER — LACTATED RINGERS IV SOLN: INTRAVENOUS | Status: DC

## 2022-01-26 SURGICAL SUPPLY — 65 items
ADH SKN CLS APL DERMABOND .7 (GAUZE/BANDAGES/DRESSINGS) ×2
APL PRP STRL LF DISP 70% ISPRP (MISCELLANEOUS) ×1
BAG COUNTER SPONGE SURGICOUNT (BAG) ×2 IMPLANT
BAG DECANTER FOR FLEXI CONT (MISCELLANEOUS) IMPLANT
BAG SPNG CNTER NS LX DISP (BAG) ×1
BLADE CLIPPER SURG (BLADE) IMPLANT
BLADE STERNUM SYSTEM 6 (BLADE) IMPLANT
CABLE ADAPT CONN TEMP 6FT (ADAPTER) ×2 IMPLANT
CANISTER SUCT 3000ML PPV (MISCELLANEOUS) IMPLANT
CATH DIAG EXPO 6F AL1 (CATHETERS) IMPLANT
CATH DIAG EXPO 6F VENT PIG 145 (CATHETERS) ×4 IMPLANT
CATH INFINITI 6F AL2 (CATHETERS) IMPLANT
CATH S G BIP PACING (CATHETERS) ×2 IMPLANT
CHLORAPREP W/TINT 26 (MISCELLANEOUS) ×2 IMPLANT
CLOSURE PERCLOSE PROSTYLE (VASCULAR PRODUCTS) ×3 IMPLANT
CNTNR URN SCR LID CUP LEK RST (MISCELLANEOUS) ×2 IMPLANT
CONT SPEC 4OZ STRL OR WHT (MISCELLANEOUS) ×4
COVER BACK TABLE 80X110 HD (DRAPES) IMPLANT
DECANTER SPIKE VIAL GLASS SM (MISCELLANEOUS) ×2 IMPLANT
DERMABOND ADVANCED (GAUZE/BANDAGES/DRESSINGS) ×2
DERMABOND ADVANCED .7 DNX12 (GAUZE/BANDAGES/DRESSINGS) ×1 IMPLANT
DEVICE CLOSURE PERCLS PRGLD 6F (VASCULAR PRODUCTS) ×2 IMPLANT
DRSG TEGADERM 4X4.75 (GAUZE/BANDAGES/DRESSINGS) ×4 IMPLANT
ELECT REM PT RETURN 9FT ADLT (ELECTROSURGICAL) ×2
ELECTRODE REM PT RTRN 9FT ADLT (ELECTROSURGICAL) ×1 IMPLANT
GAUZE SPONGE 2X2 8PLY STRL LF (GAUZE/BANDAGES/DRESSINGS) IMPLANT
GAUZE SPONGE 4X4 12PLY STRL (GAUZE/BANDAGES/DRESSINGS) ×1 IMPLANT
GLOVE SURG ENC MOIS LTX SZ7.5 (GLOVE) IMPLANT
GLOVE SURG ENC MOIS LTX SZ8 (GLOVE) IMPLANT
GLOVE SURG ORTHO LTX SZ7.5 (GLOVE) IMPLANT
GOWN STRL REUS W/ TWL LRG LVL3 (GOWN DISPOSABLE) IMPLANT
GOWN STRL REUS W/ TWL XL LVL3 (GOWN DISPOSABLE) ×1 IMPLANT
GOWN STRL REUS W/TWL LRG LVL3 (GOWN DISPOSABLE)
GOWN STRL REUS W/TWL XL LVL3 (GOWN DISPOSABLE) ×2
GUIDEWIRE SAF TJ AMPL .035X180 (WIRE) ×3 IMPLANT
GUIDEWIRE SAFE TJ AMPLATZ EXST (WIRE) ×2 IMPLANT
KIT BASIN OR (CUSTOM PROCEDURE TRAY) ×2 IMPLANT
KIT HEART LEFT (KITS) ×2 IMPLANT
KIT TURNOVER KIT B (KITS) ×2 IMPLANT
NS IRRIG 1000ML POUR BTL (IV SOLUTION) ×2 IMPLANT
PACK ENDO MINOR (CUSTOM PROCEDURE TRAY) ×2 IMPLANT
PAD ARMBOARD 7.5X6 YLW CONV (MISCELLANEOUS) ×4 IMPLANT
PAD ELECT DEFIB RADIOL ZOLL (MISCELLANEOUS) ×2 IMPLANT
PERCLOSE PROGLIDE 6F (VASCULAR PRODUCTS) ×4
POSITIONER HEAD DONUT 9IN (MISCELLANEOUS) ×2 IMPLANT
SET MICROPUNCTURE 5F STIFF (MISCELLANEOUS) ×2 IMPLANT
SHEATH BRITE TIP 7FR 35CM (SHEATH) ×2 IMPLANT
SHEATH PINNACLE 6F 10CM (SHEATH) ×2 IMPLANT
SHEATH PINNACLE 8F 10CM (SHEATH) ×2 IMPLANT
SLEEVE REPOSITIONING LENGTH 30 (MISCELLANEOUS) ×2 IMPLANT
SPONGE GAUZE 2X2 STER 10/PKG (GAUZE/BANDAGES/DRESSINGS) ×2
STOPCOCK MORSE 400PSI 3WAY (MISCELLANEOUS) ×4 IMPLANT
SUT PROLENE 6 0 C 1 30 (SUTURE) IMPLANT
SUT SILK  1 MH (SUTURE) ×2
SUT SILK 1 MH (SUTURE) ×1 IMPLANT
SYR 50ML LL SCALE MARK (SYRINGE) ×2 IMPLANT
SYR BULB IRRIG 60ML STRL (SYRINGE) IMPLANT
TOWEL GREEN STERILE (TOWEL DISPOSABLE) ×4 IMPLANT
TRANSDUCER W/STOPCOCK (MISCELLANEOUS) ×4 IMPLANT
TRAY FOLEY SLVR 14FR TEMP STAT (SET/KITS/TRAYS/PACK) IMPLANT
TUBE SUCT INTRACARD DLP 20F (MISCELLANEOUS) IMPLANT
VALVE HEART TRANSCATH SZ3 29MM (Valve) ×1 IMPLANT
WIRE EMERALD 3MM-J .035X150CM (WIRE) ×2 IMPLANT
WIRE EMERALD 3MM-J .035X260CM (WIRE) ×2 IMPLANT
WIRE EMERALD ST .035X260CM (WIRE) ×1 IMPLANT

## 2022-01-26 NOTE — Op Note (Signed)
[facility]  ?TAVR OPERATIVE NOTE ? ? ?Date of Procedure:  01/26/2022 ? ?Preoperative Diagnosis: Severe Aortic Stenosis  ? ?Postoperative Diagnosis: Same  ? ?Procedure:  ? ?Transcatheter Aortic Valve Replacement - Transfemoral Approach ? Edwards Sapien 3 Ultra THV (size 29 mm, model # 9600CM29A, serial # [id number]) ?  ?Co-Surgeons:   Bryan K. Bartle, MD and  , MD ?Anesthesiologist:  Carswell Jackson, MD ? ?Echocardiographer:  Peter Nishan, MD ? ?Pre-operative Echo Findings: ?Severe aortic stenosis ?Normal left ventricular systolic function ? ?Post-operative Echo Findings: ?No paravalvular leak ?Normal left ventricular systolic function ? ?Left Heart Catheterization Findings: ?Left ventricular end-diastolic pressure of 18 mmHg ? ? ?BRIEF CLINICAL NOTE AND INDICATIONS FOR SURGERY ? ?The patient is a 76-year-old male with history of hypertension, type 2 diabetes, intracranial hemorrhage requiring craniotomy and severe aortic stenosis who presented in February with dizziness.  Because of his severe aortic stenosis he was evaluated for aortic valve intervention. ? ?During the course of the patient's preoperative work up they have been evaluated comprehensively by a multidisciplinary team of specialists coordinated through the Multidisciplinary Heart [facility]ve Clinic in the [facility].  They have been demonstrated to suffer from symptomatic severe aortic stenosis as noted above. The patient has been counseled extensively as to the relative risks and benefits of all options for the treatment of severe aortic stenosis including long term medical therapy, conventional surgery for aortic valve replacement, and transcatheter aortic valve replacement.  The patient has been independently evaluated by Dr. Bartle with CT surgery and they are felt to be at high risk for conventional surgical aortic valve replacement. The surgeon indicated the patient would be a poor candidate for  conventional surgery. Based upon review of all of the patient's preoperative diagnostic tests they are felt to be candidate for transcatheter aortic valve replacement using the transfemoral approach as an alternative to high risk conventional surgery.   ? ?Following the decision to proceed with transcatheter aortic valve replacement, a discussion has been held regarding what types of management strategies would be attempted intraoperatively in the event of life-threatening complications, including whether or not the patient would be considered a candidate for the use of cardiopulmonary bypass and/or conversion to open sternotomy for attempted surgical intervention.  The patient has been advised of a variety of complications that might develop peculiar to this approach including but not limited to risks of death, stroke, paravalvular leak, aortic dissection or other major vascular complications, aortic annulus rupture, device embolization, cardiac rupture or perforation, acute myocardial infarction, arrhythmia, heart block or bradycardia requiring permanent pacemaker placement, congestive heart failure, respiratory failure, renal failure, pneumonia, infection, other late complications related to structural valve deterioration or migration, or other complications that might ultimately cause a temporary or permanent loss of functional independence or other long term morbidity.  The patient provides full informed consent for the procedure as described and all questions were answered preoperatively. ? ? ? ?DETAILS OF THE OPERATIVE PROCEDURE ? ?PREPARATION:   ?The patient is brought to the operating room on the above mentioned date and central monitoring was established by the anesthesia team including placement of a radial arterial line. The patient is placed in the supine position on the operating table.  Intravenous antibiotics are administered. Conscious sedation is used.  ? ?Baseline transthoracic echocardiogram was  performed. The patient's chest, abdomen, both groins, and both lower extremities are prepared and draped in a sterile manner. A time out procedure is performed. ? ? ?  PERIPHERAL ACCESS:   ?Using the modified Seldinger technique, femoral arterial and venous access were obtained with placement of a 6 Fr sheath in the artery and a 7 Fr sheath in the vein on the left and right sides respectively using u/s guidance.  A pigtail diagnostic catheter was passed through the femoral arterial sheath under fluoroscopic guidance into the aortic root.  Aortic root angiography was performed in order to determine the optimal angiographic angle for valve deployment. ? ?TRANSFEMORAL ACCESS:  ?A micropuncture kit was used to gain access to the right femoral artery using u/s guidance. Position confirmed with angiography. Pre-closure with double ProGlide closure devices. The patient was heparinized systemically and ACT verified > 250 seconds.   ? ?A 16 Fr transfemoral E-sheath was introduced into the right femoral artery after progressively dilating over an Amplatz superstiff wire. An AL-1 catheter was used to direct a straight-tip exchange length wire across the native aortic valve into the left ventricle. This was exchanged out for a pigtail catheter and position was confirmed in the LV apex. Simultaneous left ventricular, aortic, and left ventricular end-diastolic pressures were recorded.  The pigtail catheter was then exchanged for an Safari wire in the LV apex.  Direct LV pacing thresholds were assessed and found to be adequate.  ? ?TRANSCATHETER HEART VALVE DEPLOYMENT:  ?An Edwards Sapien 3 THV (size 29 mm) was prepared and crimped per manufacturer's guidelines, and the proper orientation of the valve is confirmed on the Edwards Commander delivery system. The valve was advanced through the introducer sheath using normal technique until in an appropriate position in the abdominal aorta beyond the sheath tip. The balloon was then  retracted and using the fine-tuning wheel was centered on the valve. The valve was then advanced across the aortic arch using appropriate flexion of the catheter. The valve was carefully positioned across the aortic valve annulus. The Commander catheter was retracted using normal technique. Once final position of the valve has been confirmed by angiographic assessment, the valve is deployed while temporarily holding ventilation and during rapid ventricular pacing to maintain systolic blood pressure < 50 mmHg and pulse pressure < 10 mmHg. The balloon inflation is held for >3 seconds after reaching full deployment volume. Once the balloon has fully deflated the balloon is retracted into the ascending aorta and valve function is assessed using TTE. There is felt to be no paravalvular leak and no central aortic insufficiency.  The patient's hemodynamic recovery following valve deployment is good.  The deployment balloon and guidewire are both removed. Echo demostrated acceptable post-procedural gradients, stable mitral valve function, and no AI.  ? ?PROCEDURE COMPLETION:  ?The sheath was then removed and closure devices were completed. Protamine was administered once femoral arterial repair was complete. The temporary pacemaker, pigtail catheters and femoral sheaths were removed and a Perclose closure device placed in the artery and manual pressure used for venous hemostasis.   ? ?The patient tolerated the procedure well and is transported to the surgical intensive care in stable condition. There were no immediate intraoperative complications. All sponge instrument and needle counts are verified correct at completion of the operation.  ? ?No blood products were administered during the operation. ? ?The patient received a total of 120 mL of intravenous contrast during the procedure. ? ? K  MD ?01/26/2022 ?9:48 AM ? ? ? ? ?

## 2022-01-26 NOTE — Transfer of Care (Signed)
Immediate Anesthesia Transfer of Care Note ? ?Patient: Tommy Green ? ?Procedure(s) Performed: Transcatheter Aortic Valve Replacement 29MM, Transfemoral ?INTRAOPERATIVE TRANSTHORACIC ECHOCARDIOGRAM ? ?Patient Location: Cath Lab ? ?Anesthesia Type:MAC ? ?Level of Consciousness: drowsy and patient cooperative ? ?Airway & Oxygen Therapy: Patient Spontanous Breathing and Patient connected to nasal cannula oxygen ? ?Post-op Assessment: Report given to RN and Post -op Vital signs reviewed and stable ? ?Post vital signs: Reviewed and stable ? ?Last Vitals:  ?Vitals Value Taken Time  ?BP    ?Temp    ?Pulse    ?Resp    ?SpO2    ? ? ?Last Pain:  ?Vitals:  ? 01/26/22 0628  ?TempSrc:   ?PainSc: 0-No pain  ?   ? ?  ? ?Complications: No notable events documented. ?

## 2022-01-26 NOTE — Interval H&P Note (Signed)
History and Physical Interval Note: ? ?01/26/2022 ?6:29 AM ? ?Tommy Green  has presented today for surgery, with the diagnosis of Severe Aortic Stenosis.  The various methods of treatment have been discussed with the patient and family. After consideration of risks, benefits and other options for treatment, the patient has consented to  Procedure(s): ?Transcatheter Aortic Valve Replacement, Transfemoral (N/A) ?INTRAOPERATIVE TRANSTHORACIC ECHOCARDIOGRAM (N/A) as a surgical intervention.  The patient's history has been reviewed, patient examined, no change in status, stable for surgery.  I have reviewed the patient's chart and labs.  Questions were answered to the patient's satisfaction.   ? ? ?Gaye Pollack ? ? ?

## 2022-01-26 NOTE — Progress Notes (Signed)
Mobility Specialist Progress Note ? ? 01/26/22 1645  ?Mobility  ?Activity Ambulated with assistance in hallway  ?Level of Assistance Minimal assist, patient does 75% or more  ?Assistive Device Front wheel walker  ?Distance Ambulated (ft) 210 ft  ?Activity Response Tolerated well  ?$Mobility charge 1 Mobility  ? ?Pre Mobility: 86 HR, 162/73 BP, 95% SpO2 ?During Mobility: 101 HR, 93% SpO2 ?Post Mobility: 83 HR, 172/77 BP, 96% SpO2 ? ?Received in bed c/o slight soreness at groin sites but agreeable, pt wanted to warn that they have drop foot in the L foot. Pt needing no breaks during ambulation but stated LE were getting tired towards end. X1 occurrences of buckling of the knees pt claims this happens quite often when they get tired. Returned back to the room w/o fault but pt's BP was presenting slightly high, RN notified. ? ?Groin sites remained stable. ? ?Holland Falling ?Mobility Specialist ?Phone Number [phone] ? ?

## 2022-01-26 NOTE — Discharge Summary (Addendum)
?[facility]   ?MULTIDISCIPLINARY HEART VALVE TEAM ? ?Discharge Summary  ?  ?Patient ID: Tommy Green ?MRN: [id number]; DOB: 12/22/1945 ? ?Admit date: 01/26/2022 ?Discharge date: 01/27/2022 ? ?Primary Care Provider: [name], DO  ?Primary Cardiologist: Shirlee More, MD  ? ?Discharge Diagnoses  ?  ?Principal Problem: ?  S/P TAVR (transcatheter aortic valve replacement) ?Active Problems: ?  Essential hypertension ?  Hyperlipidemia associated with type 2 diabetes mellitus ([place]) ?  Type 2 diabetes mellitus with diabetic neuropathy, unspecified ([place]) ?  Hypertriglyceridemia ?  History of stroke ?  Anemia ?  Stage 3 chronic kidney disease ([place]) ?  Obesity (BMI 30-39.9) ?  Severe aortic stenosis ? ?Allergies ?Allergies  ?Allergen Reactions  ? Fluzone Quadrivalent [Influenza Vac Split Quad] Other (See Comments)  ?  "Got the flu"  ? Influenza Vaccines Other (See Comments)  ?  "Got the flu"  ? Aspirin Other (See Comments)  ?  Gastroesophageal reflux and "allergic," per [facility] ?  ? Atorvastatin Other (See Comments)  ?  Myalgias, GI upset, and "allergic," per [facility]  ? Canagliflozin Other (See Comments)  ?  Pt reports that the use of Invokana caused acute KIDNEY FAILURE ([facility] records) and "allergic," per [facility]  ? Statins Other (See Comments)  ?  Muscle pain- "allergic," per [facility]  ? ?Diagnostic Studies/Procedures  ?  ?  ?TAVR OPERATIVE NOTE ?  ?  ?Date of Procedure:                01/26/2022 ?  ?Preoperative Diagnosis:      Severe Aortic Stenosis  ?  ?Postoperative Diagnosis:    Same  ?  ?Procedure:      ?  ?Transcatheter Aortic Valve Replacement - Percutaneous Right Transfemoral Approach ?            Edwards Sapien 3 Ultra THV (size 29 mm, model # 9600TFX, serial # [id number]) ?             ?Co-Surgeons:                        Gaye Pollack, MD and Lenna Sciara, MD ?  ?  ?Anesthesiologist:                  Annye Asa, MD ?  ?Echocardiographer:              Jenkins Rouge, MD ?  ?Pre-operative Echo  Findings: ?Severe aortic stenosis ?Normal left ventricular systolic function ?  ?Post-operative Echo Findings: ?No paravalvular leak ?Normal left ventricular systolic function ?  ? ?Left Heart Catheterization Findings: ?Left ventricular end-diastolic pressure of 18 mmHg ?  ?_____________ ?  ?Echo 01/27/22:  ? ? 1. Left ventricular ejection fraction, by estimation, is 60 to 65%. The  ?left ventricle has normal function. The left ventricle has no regional  ?wall motion abnormalities. There is mild concentric left ventricular  ?hypertrophy and severe basal septal  ?hypertrophy.  ? 2. Right ventricular systolic function is normal. The right ventricular  ?size is normal. Tricuspid regurgitation signal is inadequate for assessing  ?PA pressure.  ? 3. The mitral valve is normal in structure. Trivial mitral valve  ?regurgitation. No evidence of mitral stenosis. Moderate mitral annular  ?calcification.  ? 4. The aortic valve has been repaired/replaced. Aortic valve  ?regurgitation is trivial. Trivial perivalvular AI at 12:00 on parasternal  ?short axis view.  ?    No aortic stenosis is present. There is  a 29 mm Sapien prosthetic  ?(TAVR) valve present in the aortic position. Procedure Date: 01/27/2022.  ?Aortic valve area, by VTI measures 2.25 cm?Marland Kitchen Aortic valve mean gradient  ?measures 11.8 mmHg. Aortic valve Vmax  ?measures 2.25 m/s.  ? 5. The inferior vena cava is normal in size with greater than 50%  ?respiratory variability, suggesting right atrial pressure of 3 mmHg.  ? 6. Aortic dilatation noted. There is borderline dilatation of the  ?ascending aorta, measuring 37 mm.  ? 7. Compared to study dated 01/26/2022, there is trivial perivalvular AI.  ?The mean AVG has increased from 27mHg to 11.868mg.  ? ?History of Present Illness   ?  ?Tommy Green is a [age].o. male with a history of childhood rheumatic fever, DMT2, CKD stage III, HTN, HLD, prior stroke, skin cancer, iron deficiency anemia, obesity and severe aortic  stenosis ?who [facility] for planned TAVR.  ? ?Per chart review, he underwent spinal fusion surgery in 01/2021. After the surgery, he became hypotensive requiring ICU level care, Levophed, blood transfusions, IV fluids. Echocardiogram during that admission showed a calcified aortic valve with moderate to severe aortic stenosis, mild LVH, LVEF 55 to 60%. He was then referred to Dr. MuBettina Gaviaor evaluation of aortic stenosis.  On discussion, the patient and wife were both very hesitant to consider cardiac interventions. Repeat echocardiogram on 10/21/2021 showed LVEF 50 to 5532%grade 1 diastolic dysfunction, severe aortic valve stenosis, severe calcification of the aortic valve. ?  ?He then presented to the ED on 2/22 complaining of dizziness. Reported that he felt different than his previous history of vertigo. At that time, he was evaluated by the structural heart team with plan to pursue TAVR. He underwent R/LHC and CT imaging which showed moderate, nonobstructive coronary artery disease and anatomy suitable for transcatheter aortic valve replacement. .  ? ?He was then evaluated by the multidisciplinary valve team and felt to have severe, symptomatic aortic stenosis and to be a suitable candidate for TAVR, which was set up for 01/26/22.  ?   ?Hospital Course  ?  ?Severe AS: s/p successful TAVR with a 29 mm Edwards Sapien 3UR THV via the TF approach on 01/26/22. Post operative echo with normal LVEF and AVA by VTI measures 2.25 cm?, mean gradient  ?measures 11.8 mmHg and no PVL. Groin sites are stable. ECG with NSR and no high grade heart block. He was previously taking ASA on a PRN basis and ASA was listed as an allergy. I have confirmed with him that he is not allergic to ASA therefore he was started on ASA '81mg'$  QD. He has ambulated with CRI without issues and was recommended for CRII. Procedure site care reviewed with patient and wife with understanding. SBE discussed however will be RX'ed at follow up  next week.   ? ?Post procedure anemia: Hb today at 7.9 with no evidence of active bleeding. Will plan to obtain CBC at follow up next week.  ? ?CKD stage IIIa: Creatinine stable today at 1.48 with a baseline at 1.5.  ? ?DM2: Resume home regimen.  ? ?Hypertension: Elevated today however home Amlodipine and lisinopril were held in the post TAVR setting. Will resume these prior to d/c.  ? ?Hyperlipidemia: Resume Zetia, Omega 3 ? ?Prior CVA: No new neuro changes. Start ASA, continue Zetia ? ?Incidental findings: Arterially enhancing lesions of the liver measuring up to 1.1 cm, potentially a flash fill hemangiomas; however, evaluation is limited given single phase imaging.  Recommend contrast-enhanced liver MRI for further evaluation.  ? ?Consultants: None   ? ?The patient was seen and examined by Dr. Burt Knack who feels that he is stable and ready for discharge today, 01/27/22 ?_____________ ? ?Discharge Vitals ?Blood pressure (!) 162/75, pulse 75, temperature 98.5 ?F (36.9 ?C), temperature source Oral, resp. rate 20, height 5' 10.5" (1.791 m), weight 102.6 kg, SpO2 93 %.  ?Filed Weights  ? 01/26/22 0603 01/27/22 0515  ?Weight: 106.6 kg 102.6 kg  ? ?General: Well developed, well nourished, NAD ?Lungs:Clear to ausculation bilaterally. No wheezes, rales, or rhonchi. Breathing is unlabored. ?Cardiovascular: RRR with S1 S2. Soft murmur ?Extremities: No edema. Groin site stable ?Neuro: Alert and oriented. No focal deficits. No facial asymmetry. MAE spontaneously. ?Psych: Responds to questions appropriately with normal affect.   ? ?Labs & Radiologic Studies  ?  ?CBC ?Recent Labs  ?  01/26/22 ?1005 01/27/22 ?0120  ?WBC  --  8.0  ?HGB 7.5* 7.9*  ?HCT 22.0* 25.0*  ?MCV  --  83.3  ?PLT  --  252  ? ?Basic Metabolic Panel ?Recent Labs  ?  01/26/22 ?1005 01/27/22 ?0120  ?NA 141 139  ?K 4.1 3.8  ?CL 109 110  ?CO2  --  22  ?GLUCOSE 155* 117*  ?BUN 26* 24*  ?CREATININE 1.50* 1.48*  ?CALCIUM  --  8.7*  ? ?Liver Function Tests ?No results for  input(s): AST, ALT, ALKPHOS, BILITOT, PROT, ALBUMIN in the last 72 hours. ?No results for input(s): LIPASE, AMYLASE in the last 72 hours. ?Cardiac Enzymes ?No results for input(s): CKTOTAL, CKMB, CKMBINDEX, T

## 2022-01-26 NOTE — Progress Notes (Signed)
?  [facility]   ?MULTIDISCIPLINARY HEART VALVE TEAM ? ?Patient doing well s/p TAVR. He is hemodynamically stable. Groin sites stable. ECG with no high grade block. Arterial line discontinued and transferred to 4E.  Plan for early ambulation after bedrest completed and hopeful discharge over the next 24-48 hours.  ? ?Kathyrn Drown NP-C ?Structural Heart Team  ?Pager: [phone] ?Phone: [phone] ? ?

## 2022-01-26 NOTE — Progress Notes (Signed)
?    20 G R brachial arterial line was pulled, and manual pressure was held for 10 min. Sterile gauze was applied at the site. No hematoma was noted. ?

## 2022-01-26 NOTE — Op Note (Signed)
?[facility]   ?MULTIDISCIPLINARY HEART VALVE TEAM ? ? ?TAVR OPERATIVE NOTE ? ? ?Date of Procedure:  01/26/2022 ? ?Preoperative Diagnosis: Severe Aortic Stenosis  ? ?Postoperative Diagnosis: Same  ? ?Procedure:  ? ?Transcatheter Aortic Valve Replacement - Percutaneous Right Transfemoral Approach ? Edwards Sapien 3 Ultra THV (size 29 mm, model # 9600TFX, serial # [id number]) ?  ?Co-Surgeons:  Gaye Pollack, MD and Lenna Sciara, MD ? ? ?Anesthesiologist:  Annye Asa, MD ? ?Echocardiographer:  Jenkins Rouge, MD ? ?Pre-operative Echo Findings: ?Severe aortic stenosis ?Normal left ventricular systolic function ? ?Post-operative Echo Findings: ?No paravalvular leak ?Normal left ventricular systolic function ? ? ?BRIEF CLINICAL NOTE AND INDICATIONS FOR SURGERY ? ?This 76 year old gentleman has stage D, severe, symptomatic aortic stenosis with NYHA class II symptoms of exertional fatigue and shortness of breath consistent with chronic diastolic congestive heart failure.  He recently presented to the emergency department with dizziness and has continued to have intermittent episodes.  I have personally reviewed his 2D echocardiogram, cardiac catheterization, and CTA studies.  Echocardiogram shows a severely calcified and thickened trileaflet aortic valve with restricted leaflet mobility.  The mean gradient was 45 mmHg with a peak gradient of 68 mmHg.  Aortic valve area was 0.75 cm? consistent with severe aortic stenosis.  LVEF was 50 to 55% with grade 1 diastolic dysfunction.  Cardiac catheterization showed moderate nonobstructive coronary disease.  I agree that aortic valve replacement is indicated for relief of his symptoms and to prevent left ventricular dysfunction. Given his age and decreased mobility as well as other comorbid risk factors I think TAVR is the best option for treating him.  His gated cardiac CTA shows anatomy suitable for TAVR using a 29 mm SAPIEN 3 valve.  Abdominal and pelvic CTA  shows adequate pelvic vascular anatomy to allow transfemoral insertion. ?  ?The patient and his wife were counseled at length regarding treatment alternatives for management of severe symptomatic aortic stenosis. The risks and benefits of surgical intervention has been discussed in detail. Long-term prognosis with medical therapy was discussed. Alternative approaches such as conventional surgical aortic valve replacement, transcatheter aortic valve replacement, and palliative medical therapy were compared and contrasted at length. This discussion was placed in the context of the patient's own specific clinical presentation and past medical history. All of their questions have been addressed.  ?  ?Following the decision to proceed with transcatheter aortic valve replacement, a discussion was held regarding what types of management strategies would be attempted intraoperatively in the event of life-threatening complications, including whether or not the patient would be considered a candidate for the use of cardiopulmonary bypass and/or conversion to open sternotomy for attempted surgical intervention. I think he would be a candidate for emergent sternotomy to manage any intraoperative complications. The patient is aware of the fact that transient use of cardiopulmonary bypass may be necessary. The patient has been advised of a variety of complications that might develop including but not limited to risks of death, stroke, paravalvular leak, aortic dissection or other major vascular complications, aortic annulus rupture, device embolization, cardiac rupture or perforation, mitral regurgitation, acute myocardial infarction, arrhythmia, heart block or bradycardia requiring permanent pacemaker placement, congestive heart failure, respiratory failure, renal failure, pneumonia, infection, other late complications related to structural valve deterioration or migration, or other complications that might ultimately cause a  temporary or permanent loss of functional independence or other long term morbidity. The patient provides full informed consent for the procedure as described and all  questions were answered.  ? ? ? ? ?DETAILS OF THE OPERATIVE PROCEDURE ? ?PREPARATION:   ? ?The patient was brought to the operating room on the above mentioned date and appropriate monitoring was established by the anesthesia team. The patient was placed in the supine position on the operating table.  Intravenous antibiotics were administered. The patient was monitored closely throughout the procedure under conscious sedation.  ? ?Baseline transthoracic echocardiogram was performed. The patient's abdomen and both groins were prepped and draped in a sterile manner. A time out procedure was performed. ? ? ?PERIPHERAL ACCESS:   ? ?Using the modified Seldinger technique, femoral arterial access was obtained with placement of 6 Fr sheath on the left side.  A pigtail diagnostic catheter was passed through the left arterial sheath under fluoroscopic guidance into the aortic root.  Aortic root angiography was performed in order to determine the optimal angiographic angle for valve deployment. A 7 Fr sheath was placed in the right femoral vein for venous access.  ? ? ?TRANSFEMORAL ACCESS:  ? ?Percutaneous transfemoral access and sheath placement was performed using ultrasound guidance.  The right common femoral artery was cannulated using a micropuncture needle and appropriate location was verified using hand injection angiogram.  A pair of Abbott Perclose percutaneous closure devices were placed and a 6 French sheath replaced into the femoral artery.  The patient was heparinized systemically and ACT verified > 250 seconds.   ? ?A 16 Fr transfemoral E-sheath was introduced into the right common femoral artery over an Amplatz superstiff wire. An AL-1 catheter was used to direct a straight-tip exchange length wire across the native aortic valve into the left  ventricle. This was exchanged out for a pigtail catheter and position was confirmed in the LV apex. Simultaneous LV and Ao pressures were recorded.  The pigtail catheter was exchanged for a Safari wire in the LV apex. Direct LV pacing threshold through the [facility] wire was checked and was adequate. ? ?BALLOON AORTIC VALVULOPLASTY:  ? ?Not performed ? ? ?TRANSCATHETER HEART VALVE DEPLOYMENT:  ? ?An Edwards Sapien 3 Ultra transcatheter heart valve (size 29 mm) was prepared and crimped per manufacturer's guidelines, and the proper orientation of the valve is confirmed on the [employer] delivery system. The valve was advanced through the introducer sheath using normal technique until in an appropriate position in the abdominal aorta beyond the sheath tip. The balloon was then retracted and using the fine-tuning wheel was centered on the valve. The valve was then advanced across the aortic arch using appropriate flexion of the catheter. The valve was carefully positioned across the aortic valve annulus. The Commander catheter was retracted using normal technique. Once final position of the valve has been confirmed by angiographic assessment, the valve is deployed while temporarily holding ventilation and during rapid ventricular pacing to maintain systolic blood pressure < 50 mmHg and pulse pressure < 10 mmHg. The balloon inflation is held for >3 seconds after reaching full deployment volume. Once the balloon has fully deflated the balloon is retracted into the ascending aorta and valve function is assessed using echocardiography. There is felt to be no paravalvular leak and no central aortic insufficiency.  The patient's hemodynamic recovery following valve deployment is good.  The deployment balloon and guidewire are both removed.  ? ? ?PROCEDURE COMPLETION:  ? ?The sheath was removed and femoral artery closure performed.  Protamine was administered once femoral arterial repair was complete. The pigtail catheter  and femoral sheaths were removed with manual  pressure used for hemostasis of the right femoral vein.  A Perclose device was utilized following removal of the diagnostic sheath in the left femoral artery. ? ?T

## 2022-01-26 NOTE — Anesthesia Postprocedure Evaluation (Signed)
Anesthesia Post Note ? ?Patient: Tommy Green ? ?Procedure(s) Performed: Transcatheter Aortic Valve Replacement 29MM, Transfemoral ?INTRAOPERATIVE TRANSTHORACIC ECHOCARDIOGRAM ? ?  ? ?Patient location during evaluation: Nursing Unit ?Anesthesia Type: MAC ?Level of consciousness: awake and alert, patient cooperative and oriented ?Pain management: pain level controlled ?Vital Signs Assessment: post-procedure vital signs reviewed and stable ?Respiratory status: spontaneous breathing, nonlabored ventilation and respiratory function stable ?Cardiovascular status: blood pressure returned to baseline and stable ?Postop Assessment: no apparent nausea or vomiting and adequate PO intake ?Anesthetic complications: no ? ? ?No notable events documented. ? ?Last Vitals:  ?Vitals:  ? 01/26/22 1215 01/26/22 1230  ?BP: 140/64 (!) 152/63  ?Pulse: (!) 12 (!) 59  ?Resp: 13 15  ?Temp:    ?SpO2: 98% 100%  ?  ?Last Pain:  ?Vitals:  ? 01/26/22 1152  ?TempSrc: Oral  ?PainSc:   ? ? ?  ?  ?  ?  ?  ?  ? ?Tommy Green,E. Conall Vangorder ? ? ? ? ?

## 2022-01-26 NOTE — Anesthesia Procedure Notes (Signed)
Arterial Line Insertion ?Start/End4/08/2022 7:20 AM, 01/26/2022 7:35 AM ?Performed by: Belinda Block, MD, anesthesiologist ? Patient location: Pre-op. ?Preanesthetic checklist: patient identified, IV checked, site marked, risks and benefits discussed, surgical consent, monitors and equipment checked, pre-op evaluation and timeout performed ?Lidocaine 1% used for infiltration and patient sedated ?Left, brachial was placed ?Catheter size: 20 G ?Hand hygiene performed , maximum sterile barriers used  and Seldinger technique used ?Allen's test indicative of satisfactory collateral circulation ?Attempts: 1 ?Procedure performed using ultrasound guided technique. ?Ultrasound Notes:anatomy identified, needle tip was noted to be adjacent to the nerve/plexus identified and no ultrasound evidence of intravascular and/or intraneural injection ?Following insertion, line sutured, dressing applied and Biopatch. ?Patient tolerated the procedure well with no immediate complications. ? ? ?

## 2022-01-26 NOTE — Progress Notes (Signed)
?  Echocardiogram ?2D Echocardiogram has been performed. ? ?Tommy Green ?01/26/2022, 9:23 AM ?

## 2022-01-26 NOTE — Anesthesia Procedure Notes (Signed)
Procedure Name: [place] ?Date/Time: 01/26/2022 8:04 AM ?Performed by: Janene Harvey, CRNA ?Pre-anesthesia Checklist: Patient identified, Emergency Drugs available, Suction available and Patient being monitored ?Patient Re-evaluated:Patient Re-evaluated prior to induction ?Oxygen Delivery Method: Simple face mask ?Induction Type: IV induction ?Placement Confirmation: positive ETCO2 ?Dental Injury: Teeth and Oropharynx as per pre-operative assessment  ? ? ? ? ?

## 2022-01-27 ENCOUNTER — Inpatient Hospital Stay (HOSPITAL_COMMUNITY): Payer: Medicare HMO

## 2022-01-27 ENCOUNTER — Encounter (HOSPITAL_COMMUNITY): Payer: Self-pay | Admitting: Internal Medicine

## 2022-01-27 DIAGNOSIS — E1140 Type 2 diabetes mellitus with diabetic neuropathy, unspecified: Secondary | ICD-10-CM | POA: Diagnosis not present

## 2022-01-27 DIAGNOSIS — I350 Nonrheumatic aortic (valve) stenosis: Secondary | ICD-10-CM | POA: Diagnosis not present

## 2022-01-27 DIAGNOSIS — Z952 Presence of prosthetic heart valve: Secondary | ICD-10-CM

## 2022-01-27 DIAGNOSIS — E1122 Type 2 diabetes mellitus with diabetic chronic kidney disease: Secondary | ICD-10-CM | POA: Diagnosis not present

## 2022-01-27 DIAGNOSIS — Z006 Encounter for examination for normal comparison and control in clinical research program: Secondary | ICD-10-CM | POA: Diagnosis not present

## 2022-01-27 LAB — BASIC METABOLIC PANEL
Anion gap: 7 (ref 5–15)
BUN: 24 mg/dL — ABNORMAL HIGH (ref 8–23)
CO2: 22 mmol/L (ref 22–32)
Calcium: 8.7 mg/dL — ABNORMAL LOW (ref 8.9–10.3)
Chloride: 110 mmol/L (ref 98–111)
Creatinine, Ser: 1.48 mg/dL — ABNORMAL HIGH (ref 0.61–1.24)
GFR, Estimated: 48 mL/min — ABNORMAL LOW (ref >60)
Glucose, Bld: 117 mg/dL — ABNORMAL HIGH (ref 70–99)
Potassium: 3.8 mmol/L (ref 3.5–5.1)
Sodium: 139 mmol/L (ref 135–145)

## 2022-01-27 LAB — ECHOCARDIOGRAM COMPLETE
AR max vel: 2.41 cm2
AV Area VTI: 2.25 cm2
AV Area mean vel: 2.17 cm2
AV Mean grad: 11.8 mmHg
AV Peak grad: 20.2 mmHg
Ao pk vel: 2.25 m/s
Area-P 1/2: 2.71 cm2
Height: 70.5 in
S' Lateral: 2.8 cm
Weight: 3618.37 oz

## 2022-01-27 LAB — GLUCOSE, CAPILLARY
Glucose-Capillary: 118 mg/dL — ABNORMAL HIGH (ref 70–99)
Glucose-Capillary: 124 mg/dL — ABNORMAL HIGH (ref 70–99)
Glucose-Capillary: 154 mg/dL — ABNORMAL HIGH (ref 70–99)
Glucose-Capillary: 180 mg/dL — ABNORMAL HIGH (ref 70–99)

## 2022-01-27 LAB — CBC
HCT: 25 % — ABNORMAL LOW (ref 39.0–52.0)
Hemoglobin: 7.9 g/dL — ABNORMAL LOW (ref 13.0–17.0)
MCH: 26.3 pg (ref 26.0–34.0)
MCHC: 31.6 g/dL (ref 30.0–36.0)
MCV: 83.3 fL (ref 80.0–100.0)
Platelets: 252 10*3/uL (ref 150–400)
RBC: 3 MIL/uL — ABNORMAL LOW (ref 4.22–5.81)
RDW: 17 % — ABNORMAL HIGH (ref 11.5–15.5)
WBC: 8 10*3/uL (ref 4.0–10.5)
nRBC: 0 % (ref 0.0–0.2)

## 2022-01-27 MED ORDER — ASPIRIN 81 MG PO CHEW: 81.0000 mg | CHEWABLE_TABLET | Freq: Every day | ORAL | Status: DC

## 2022-01-27 NOTE — Progress Notes (Signed)
?  2D Echocardiogram has been performed. ? Darlina Sicilian M ?01/27/2022, 8:00 AM ?

## 2022-01-27 NOTE — Progress Notes (Signed)
D/c tele and Ivs. Went over AVS with pt and all questions were answered.  ? ?Cianna Kasparian S Giavanna Kang, RN ? ?

## 2022-01-27 NOTE — Progress Notes (Signed)
CARDIAC REHAB PHASE I  ? ?PRE:  Rate/Rhythm: 4 SR with PACs ? ?  BP: sitting 162/75 ? ?  SaO2:  ? ?MODE:  Ambulation: 310 ft  ? ?POST:  Rate/Rhythm: 122 ST ? ?  BP: sitting 184/62  ? ?  SaO2: 97 RA ? ?Pt stood and ambulated independently with rollator, standby assist in hall. Tolerated well, able to clear left foot well. Rest standing against wall after 155 ft then returned to room. HR elevated with some SOB however pt sts he feels much better than PTA. BP elevated.  ? ?Discussed with pt and wife restrictions, walking at home, CRPII and also option for [employer] (pool, etc). Will refer to [facility]. Declined diet ed as he has made great progress recently. ?[id number]  ? ?Yves Dill CES, ACSM ?01/27/2022 ?9:47 AM ? ? ? ? ?

## 2022-01-28 ENCOUNTER — Encounter: Payer: Medicare HMO | Admitting: Physical Therapy

## 2022-01-28 ENCOUNTER — Telehealth: Payer: Self-pay

## 2022-01-28 MED FILL — Magnesium Sulfate Inj 50%: INTRAMUSCULAR | Qty: 10 | Status: AC

## 2022-01-28 MED FILL — Potassium Chloride Inj 2 mEq/ML: INTRAVENOUS | Qty: 20 | Status: AC

## 2022-01-28 MED FILL — Heparin Sodium (Porcine) Inj 1000 Unit/ML: Qty: 1000 | Status: AC

## 2022-01-28 NOTE — Telephone Encounter (Signed)
Patient contacted regarding discharge from [facility] on 01/27/2022. ? ?Patient understands to follow up with provider Structural Heart APP on 02/03/2022 at 9:15 AM at [facility] office. ?Patient understands discharge instructions? yes ?Patient understands medications and regiment? yes ?Patient understands to bring all medications to this visit? yes ? ?

## 2022-01-31 DIAGNOSIS — M25511 Pain in right shoulder: Secondary | ICD-10-CM | POA: Diagnosis not present

## 2022-02-03 ENCOUNTER — Ambulatory Visit: Payer: Medicare HMO | Admitting: Cardiology

## 2022-02-03 VITALS — BP 142/58 | HR 87 | Ht 71.0 in | Wt 235.6 lb

## 2022-02-03 DIAGNOSIS — E1169 Type 2 diabetes mellitus with other specified complication: Secondary | ICD-10-CM

## 2022-02-03 DIAGNOSIS — E1140 Type 2 diabetes mellitus with diabetic neuropathy, unspecified: Secondary | ICD-10-CM | POA: Diagnosis not present

## 2022-02-03 DIAGNOSIS — E785 Hyperlipidemia, unspecified: Secondary | ICD-10-CM

## 2022-02-03 DIAGNOSIS — D509 Iron deficiency anemia, unspecified: Secondary | ICD-10-CM

## 2022-02-03 DIAGNOSIS — I350 Nonrheumatic aortic (valve) stenosis: Secondary | ICD-10-CM

## 2022-02-03 DIAGNOSIS — M4325 Fusion of spine, thoracolumbar region: Secondary | ICD-10-CM | POA: Diagnosis not present

## 2022-02-03 DIAGNOSIS — I10 Essential (primary) hypertension: Secondary | ICD-10-CM

## 2022-02-03 DIAGNOSIS — Z794 Long term (current) use of insulin: Secondary | ICD-10-CM

## 2022-02-03 DIAGNOSIS — M21372 Foot drop, left foot: Secondary | ICD-10-CM | POA: Diagnosis not present

## 2022-02-03 DIAGNOSIS — R9389 Abnormal findings on diagnostic imaging of other specified body structures: Secondary | ICD-10-CM

## 2022-02-03 LAB — BASIC METABOLIC PANEL
BUN/Creatinine Ratio: 18 (ref 10–24)
BUN: 26 mg/dL (ref 8–27)
CO2: 21 mmol/L (ref 20–29)
Calcium: 9.1 mg/dL (ref 8.6–10.2)
Chloride: 103 mmol/L (ref 96–106)
Creatinine, Ser: 1.41 mg/dL — ABNORMAL HIGH (ref 0.76–1.27)
Glucose: 161 mg/dL — ABNORMAL HIGH (ref 70–99)
Potassium: 4.8 mmol/L (ref 3.5–5.2)
Sodium: 139 mmol/L (ref 134–144)
eGFR: 51 mL/min/{1.73_m2} — ABNORMAL LOW (ref >59)

## 2022-02-03 LAB — CBC
Hematocrit: 25.2 % — ABNORMAL LOW (ref 37.5–51.0)
Hemoglobin: 8 g/dL — ABNORMAL LOW (ref 13.0–17.7)
MCH: 26.1 pg — ABNORMAL LOW (ref 26.6–33.0)
MCHC: 31.7 g/dL (ref 31.5–35.7)
MCV: 82 fL (ref 79–97)
Platelets: 296 10*3/uL (ref 150–450)
RBC: 3.07 x10E6/uL — ABNORMAL LOW (ref 4.14–5.80)
RDW: 15.4 % (ref 11.6–15.4)
WBC: 8.4 10*3/uL (ref 3.4–10.8)

## 2022-02-03 MED ORDER — AMOXICILLIN 500 MG PO TABS: 2000.0000 mg | ORAL_TABLET | ORAL | 6 refills | Status: DC

## 2022-02-03 NOTE — Progress Notes (Signed)
?[facility]   ?[name] ?                                    ?Cardiology Office Note:   ? ?Date:  02/03/2022  ? ?ID:  Tommy Green, DOB Jun 26, 1946, MRN [id number] ? ?PCP:  [name], DO  ?[place] Cardiologist:  Shirlee More, MD  ?[facility] Electrophysiologist:  None  ? ?Referring MD: [name]*  ? ?Chief Complaint  ?Patient presents with  ? Follow-up  ?  1 week s/p TAVR   ? ?History of Present Illness:   ? ?Tommy Green is a 76 y.o. male with a hx of childhood rheumatic fever, DMT2, CKD stage III, HTN, HLD, prior stroke, skin cancer, iron deficiency anemia, obesity and severe aortic stenosis who presents today for 1 week follow up s/p TAVR performed 01/26/22.   ? ?Per chart review, he underwent spinal fusion surgery in 01/2021. After the surgery, he became hypotensive requiring ICU level care, Levophed, blood transfusions, IV fluids. Echocardiogram during that admission showed a calcified aortic valve with moderate to severe aortic stenosis, mild LVH, LVEF 55 to 60%. He was then referred to Dr. [name] for evaluation of aortic stenosis.  On discussion, the patient and wife were both very hesitant to consider cardiac interventions. Repeat echocardiogram on 10/21/2021 showed LVEF 50 to 32%, grade 1 diastolic dysfunction, severe aortic valve stenosis, severe calcification of the aortic valve. ?  ?He then presented to the ED on 2/22 complaining of dizziness. Reported that he felt different than his previous history of vertigo. At that time, he was evaluated by the structural heart team with plan to pursue TAVR. He underwent R/LHC and CT imaging which showed moderate, nonobstructive coronary artery disease and anatomy suitable for transcatheter aortic valve replacement. .  ?  ?He was then evaluated by the multidisciplinary valve team and felt to have severe, symptomatic aortic stenosis and to be a suitable candidate for TAVR.  ? ?He  underwent TAVR with 29 mm Edwards Sapien 3UR THV via the TF approach on 01/26/22. Post operative echo with normal LVEF and AVA by VTI measures 2.25 cm?, mean gradient measures 11.8 mmHg and no PVL. He was previously taking ASA on a PRN basis and ASA was listed as an allergy. I confirmed with him that he is not allergic to ASA therefore he was started on ASA '81mg'$  QD. He ambulated with CRI without issues and was recommended for CRII.  ? ?Today he presents with his wife. He reports that he has been doing well since hospital discharge. He continues to have some afternoon fatigue although feels this may be related to his chronic anemia. He previously saw a GI doctor for this with recommendations to undergo further testing, likely with EGD to assess for intermittent GI bleeding however he never pursued this. He has no active bleeding in stool or urine. No change in stool. He denies chest pain, SOB, palpitations, LE edema, orthopnea, dizziness, or fatigue.  ? ?Past Medical History:  ?Diagnosis Date  ? Anemia   ? Essential hypertension 07/18/2017  ? Foot drop, left foot   ? due to back surgery in 01/2021  ? GERD (gastroesophageal reflux disease)   ? History of colonic polyps   ? History of hiatal hernia   ? many years ago  ? History of rheumatic fever   ? History of  stroke 07/18/2017  ? Hypertriglyceridemia 07/18/2017  ? S/P TAVR (transcatheter aortic valve replacement) 01/26/2022  ? Edwards S3UR 33m via TF approach with Dr. [name] Dr. BCyndia Bent ? Severe aortic stenosis   ? Severe aortic stenosis   ? Type 2 diabetes mellitus with diabetic neuropathy, unspecified ([place] 07/18/2017  ? ? ?Past Surgical History:  ?Procedure Laterality Date  ? COLONOSCOPY    ? around 2018 [facility]  ? COLONOSCOPY  09/02/2020  ? CRANIOTOMY Left 10/25/2019  ? Procedure: CRANIOTOMY HEMATOMA EVACUATION SUBDURAL;  Surgeon: [name] [name];  [place] [facility]  Service: Neurosurgery;  Laterality: Left;  ? ESOPHAGOGASTRODUODENOSCOPY    ?  around 2018 with [facility]  ? FOOT CAPSULOTOMY Left 07/25/2008  ? Mid Foot #2 MPJ  ? Hammertoe Repair Left 07/25/2008  ? #2 toe  ? INTRAOPERATIVE TRANSTHORACIC ECHOCARDIOGRAM N/A 01/26/2022  ? Procedure: INTRAOPERATIVE TRANSTHORACIC ECHOCARDIOGRAM;  Surgeon: TEarly Osmond MD;  [place] [facility]  Service: Open Heart Surgery;  Laterality: N/A;  ? RIGHT/LEFT HEART CATH AND CORONARY ANGIOGRAPHY N/A 12/11/2021  ? Procedure: RIGHT/LEFT HEART CATH AND CORONARY ANGIOGRAPHY;  Surgeon: [name];  [facility];  Service: Cardiovascular;  Laterality: N/A;  ? SPINAL FUSION  01/2021  ? TARSAL TUNNEL RELEASE Left 07/25/2008  ? TONSILLECTOMY    ? removed as a child  ? TRANSCATHETER AORTIC VALVE REPLACEMENT, TRANSFEMORAL N/A 01/26/2022  ? Procedure: Transcatheter Aortic Valve Replacement 29MM, Transfemoral;  Surgeon: TEarly Osmond MD;  [place] [facility]  Service: Open Heart Surgery;  Laterality: N/A;  Percutaneous  ? UPPER GASTROINTESTINAL ENDOSCOPY  09/02/2020  ? ? ?Current Medications: ?Current Meds  ?Medication Sig  ? acetaminophen (TYLENOL) 325 MG tablet Take 650 mg by mouth every 6 (six) hours as needed for mild pain or headache.  ? amLODipine (NORVASC) 5 MG tablet Take 1 tablet (5 mg total) by mouth daily.  ? amoxicillin (AMOXIL) 500 MG tablet Take 4 tablets (2,000 mg total) by mouth as directed. 1 HOUR PRIOR TO DENTAL APPOINTMENTS  ? aspirin 81 MG chewable tablet Chew 1 tablet (81 mg total) by mouth daily.  ? B Complex Vitamins (B COMPLEX PO) Take 1 tablet by mouth daily.  ? Cholecalciferol (VITAMIN D3 SUPER STRENGTH) 50 MCG (2000 UT) CAPS Take 2,000 Units by mouth daily.  ? Etanercept (ENBREL MINI) 50 MG/ML SOCT Inject 50 mg into the skin once a week. (Patient taking differently: Inject 50 mg into the skin every Monday.)  ? ezetimibe (ZETIA) 10 MG tablet Take 1 tablet (10 mg total) by mouth daily. For elevated cholesterol and triglycerides  ? fenofibrate micronized (LOFIBRA) 200 MG  capsule TAKE 1 CAPSULE DAILY BEFOREBREAKFAST  ? gabapentin (NEURONTIN) 100 MG capsule TAKE 2 CAPSULES AT BEDTIME  ? HYDROcodone-acetaminophen (NORCO) 10-325 MG tablet Take 0.5 tablets by mouth See admin instructions. Take 0.5 tablet by mouth at bedtime and an additional 0.5 tablet once a day as needed for pain  ? ibuprofen (ADVIL) 200 MG tablet Take 600 mg by mouth every 6 (six) hours as needed for mild pain or headache.  ? insulin aspart (NOVOLOG FLEXPEN) 100 UNIT/ML FlexPen Inject 0-10 Units into the skin See admin instructions. Inject 0-10 units into the skin before meals, PER SLIDING SCALE: BGL 0-200 = give nothing, 201-250 = 2 units, 251-300 = 4 units, 301-350 = 6 units, 351-400 = 8 units, >400 = 10 units, push fluids, and repeat BGL check in 2 hours. If BGL remains >400  after 2 hours, CALL MD.  ? insulin degludec (TRESIBA) 100 UNIT/ML FlexTouch Pen Inject 30 Units into the skin at bedtime.  ? leflunomide (ARAVA) 10 MG tablet TAKE 1 TABLET BY MOUTH EVERY DAY  ? lisinopril (ZESTRIL) 40 MG tablet Take 1 tablet (40 mg total) by mouth in the morning.  ? magnesium oxide (MAG-OX) 400 MG tablet Take 400 mg by mouth daily with lunch.  ? melatonin 5 MG TABS Take 5 mg by mouth at bedtime.  ? Multiple Vitamins-Minerals (MULTIVITAMIN WITH MINERALS) tablet Take 1 tablet by mouth daily with breakfast.  ? Omega 3 1000 MG CAPS Take 1,000 mg by mouth daily with lunch.  ? omeprazole (PRILOSEC) 20 MG capsule Take 1 capsule (20 mg total) by mouth daily before breakfast.  ? ONETOUCH VERIO test strip 4 (four) times daily.  ? Phenylephrine HCl (SINEX REGULAR NA) Place 1 spray into both nostrils daily as needed.  ? Potassium 99 MG TABS Take 99 mg by mouth every evening.  ? vitamin B-12 (CYANOCOBALAMIN) 1000 MCG tablet Take 1,000 mcg by mouth daily.  ? vitamin E 180 MG (400 UNITS) capsule Take 400 Units by mouth daily.  ?  ? ?Allergies:   Fluzone quadrivalent [influenza vac split quad], Influenza vaccines, Aspirin, Atorvastatin,  Canagliflozin, and Statins  ? ?Social History  ? ?Socioeconomic History  ? Marital status: Married  ?  Spouse name: Not on file  ? Number of children: 2  ? Years of education: Not on file  ? Highest education level:

## 2022-02-03 NOTE — Patient Instructions (Signed)
Medication Instructions:  ?Your physician has recommended you make the following change in your medication:  ?START AMOXICILLIN 500 MG - TAKE 2000 MG (4 TABLETS) 1 HOUR PRIOR TO DENTAL APPOINTMENTS   ?*If you need a refill on your cardiac medications before your next appointment, please call your pharmacy* ? ? ?Lab Work: ?TODAY: BMET, CBC ?If you have labs (blood work) drawn today and your tests are completely normal, you will receive your results only by: ?MyChart Message (if you have MyChart) OR ?A paper copy in the mail ?If you have any lab test that is abnormal or we need to change your treatment, we will call you to review the results. ? ? ?Testing/Procedures: ?YOUR PROVIDER RECOMMENDS THAT YOU HAVE AN MRI OF THE ABDOMEN DONE AT [place] ? ?Follow-Up: ?At [facility], you and your health needs are our priority.  As part of our continuing mission to provide you with exceptional heart care, we have created designated Provider Care Teams.  These Care Teams include your primary Cardiologist (physician) and Advanced Practice Providers (APPs -  Physician Assistants and Nurse Practitioners) who all work together to provide you with the care you need, when you need it. ? ?We recommend signing up for the patient portal called "MyChart".  Sign up information is provided on this After Visit Summary.  MyChart is used to connect with patients for Virtual Visits (Telemedicine).  Patients are able to view lab/test results, encounter notes, upcoming appointments, etc.  Non-urgent messages can be sent to your provider as well.   ?To learn more about what you can do with MyChart, go to [email or web address].   ? ?Your next appointment:   ?KEEP SCHEDULED FOLLOW-UP  ? ?Important Information About Sugar ? ? ? ? ?  ?

## 2022-02-08 ENCOUNTER — Other Ambulatory Visit: Payer: Self-pay | Admitting: Physician Assistant

## 2022-02-08 ENCOUNTER — Other Ambulatory Visit: Payer: Self-pay | Admitting: *Deleted

## 2022-02-08 DIAGNOSIS — M0579 Rheumatoid arthritis with rheumatoid factor of multiple sites without organ or systems involvement: Secondary | ICD-10-CM

## 2022-02-08 MED ORDER — ENBREL MINI 50 MG/ML ~~LOC~~ SOCT: 50.0000 mg | SUBCUTANEOUS | 0 refills | Status: DC

## 2022-02-08 NOTE — Telephone Encounter (Signed)
Patient called requesting a refill on Enbrel. ? ?Next Visit: 03/25/2022 ? ?Last Visit: 11/17/2021 ? ?Last Fill: 09/17/2021 ? ?YH:TMBPJPETKK arthritis involving multiple sites with positive rheumatoid factor  ? ?Current Dose per office note 11/17/2021: Enbrel mini 50 mg sq injections once weekly ? ?Labs: 02/03/2022 Glucose 161, Creat. 1.41, GFR 51, RBC 3.07, Hgb 8.0, Hct 25.2, MCH 26.1 ? ?TB Gold: 06/16/2021 Neg  ? ?Okay to refill Enbrel Mini?  ?

## 2022-02-09 DIAGNOSIS — E1165 Type 2 diabetes mellitus with hyperglycemia: Secondary | ICD-10-CM | POA: Diagnosis not present

## 2022-02-09 DIAGNOSIS — Z6837 Body mass index (BMI) 37.0-37.9, adult: Secondary | ICD-10-CM | POA: Diagnosis not present

## 2022-02-09 DIAGNOSIS — E782 Mixed hyperlipidemia: Secondary | ICD-10-CM | POA: Diagnosis not present

## 2022-02-09 DIAGNOSIS — E876 Hypokalemia: Secondary | ICD-10-CM | POA: Diagnosis not present

## 2022-02-09 DIAGNOSIS — N183 Chronic kidney disease, stage 3 unspecified: Secondary | ICD-10-CM | POA: Diagnosis not present

## 2022-02-09 DIAGNOSIS — I10 Essential (primary) hypertension: Secondary | ICD-10-CM | POA: Diagnosis not present

## 2022-02-09 DIAGNOSIS — E1140 Type 2 diabetes mellitus with diabetic neuropathy, unspecified: Secondary | ICD-10-CM | POA: Diagnosis not present

## 2022-02-17 ENCOUNTER — Ambulatory Visit (HOSPITAL_BASED_OUTPATIENT_CLINIC_OR_DEPARTMENT_OTHER): Payer: Medicare HMO

## 2022-02-24 ENCOUNTER — Other Ambulatory Visit: Payer: Self-pay | Admitting: Family Medicine

## 2022-02-25 ENCOUNTER — Ambulatory Visit (INDEPENDENT_AMBULATORY_CARE_PROVIDER_SITE_OTHER): Payer: Medicare HMO | Admitting: Pharmacist

## 2022-02-25 DIAGNOSIS — R682 Dry mouth, unspecified: Secondary | ICD-10-CM

## 2022-02-25 DIAGNOSIS — E1169 Type 2 diabetes mellitus with other specified complication: Secondary | ICD-10-CM

## 2022-02-25 DIAGNOSIS — I10 Essential (primary) hypertension: Secondary | ICD-10-CM

## 2022-02-25 DIAGNOSIS — Z794 Long term (current) use of insulin: Secondary | ICD-10-CM

## 2022-02-25 NOTE — Chronic Care Management (AMB) (Signed)
? ? ?Chronic Care Management ?Pharmacy Note ? ?02/25/2022 ?Name:  Tommy Green MRN:  [id number] DOB:  1946-07-12 ? ?Summary: ?Aortic Stenosis: Patient had TAVR 01/07/2022. Patient reports surgery went well and he feels he has more energy since TAVR. It was noted that he should start aspirin 9m daily but patient has not started yet. In reviewing notes from nurse practitioner at cardiology office there was a notation about history of low HGB and plan to check abdominal ultrasound (scheduled for 03/17/2022). Patient has seen GI in past and looks like EGD was recommended but patient did not have done. Discussed risk of bleeding with aspirin to patient. I will reach out to NP with cardio office but instructed patient to hold off on starting aspirin until I consult with cardiology. He has  follow up with Cardio and ECHO scheduled for 03/03/2022 ?DM, insulin dependent: Denies hypoglycemia in the last months. Highest blood glucose was 180. Patient saw Dr DMeredith Pellast week (note not available). He also had eye exam in January 2023 [phone]- had to leave message for OV notes on VM) ?Hypertension: Last blood pressure at cardio office was slightly elevated at 142/58. Recent home blood pressure has been at goal, ranging form 125 to 135 / 80's. Patient endorses adherence to blood pressure medications. Reviewed refill history and had filled on time for 2023. ?Hyperlipidemia: Patient restarted ezetimibe and is tolerating well. Due to have lipids rechecked with next labs.  ? ?Plan: follow up 6 weeks to review medication adherence, blood pressure and blood glucose.  ? ?Subjective: ?DMalcome Green is an 76y.o. year old male who is a primary patient of [name] DO.  The CCM team was consulted for assistance with disease management and care coordination needs.   ? ?Engaged with patient by telephone for follow up visit in response to provider referral for  pharmacy case management and/or care coordination services.  ? ?Consent to Services:  ?The patient was given information about Chronic Care Management services, agreed to services, and gave verbal consent prior to initiation of services.  Please see initial visit note for detailed documentation.  ? ?Patient Care Team: ?[name] DO as PCP - General (Family Medicine) ?MRichardo Priest MD as PCP - Cardiology (Cardiology) ?[name] MD as Referring Physician (Internal Medicine) ?[name] OD (Optometry) ?[name] MD (Orthopedic Surgery) ?[name] RPH-CPP (Pharmacist) ? ?Recent office visits: ?12/28/2021 - Fam Med (Dr WNani Ravens Video Visit. Left ear pain. Prescribed prednisone 486m- take 2 tablets = 8065mor 5 days.  ?12/21/2021 - Fam Med (Dr WenNani Ravenseen for hospital follow up / pre syncope. Recommended suspending physical therapy for awhile. Check CBC, CMP, magnesium and phosphorus.  ?11/27/2021 - Fam /Med (Dr WenNani Ravenseen for sore toe. BP was 141/86. Recommended triple antibiotic ointment twice a day and cover with band-aid. Recommended mesh / better fitting shoes. ?11/09/21-Nicholas PauNolene EbbsO (PCP, Video Visit) Seen for hypertension. [facility], Start HCTZ. ?08/31/21-Nicholas PauNolene EbbsO (PCP) Seen for Edema. Ambulatory referral to Sports Medicine. Ambulatory referral to Cardiology. Follow up visit prn. ?  ?Recent consult visits:  ?02/03/2022 - Cardio ([facility] PeersP) F/U TAVR and aortic valve stenosis. Prescribed amoxicillin to be used prior to dental procedures. Labs ordered. EKG and MRI of abdomen also ordered.  ?01/07/2022 - Cartiothoracic surgery (Dr BarCyndia Benturgical consult for aortic valve replacement. Patient scheduled for TAVR.  ?11/17/21-(Rheumatology) ShaAbel Presto  Estanislado Pandy, MD. Medication management visit. Labs ordered. Follow up in 5 months.  ?10/29/21-(Podiatry) Gae Dry, DPM. Seen for diabetic foot  exam. ?10/20/21-(Cardiology) [name], MD. Echo cardiogram ordered. Follow up in 6 months. ?09/17/21-(Sports Medicine) Rosemarie Ax, MD. Follow up for his left foot pain. ?09/08/21-(Dermatology) Loleta Chance. Notes not available. ?09/04/21-(Gastroenterology) GI Sheran Spine, [place]) Seen for abdominal pain. Symptoms began after back surgery in 01/2021. He received many antibiotics at that time and ever since he has had excessive gas and abdominal bloating. Suspected SIBO from DM and prior abdominal surgery. Pt declined Celiac labs, GIPP stool study, and a HBT but he was empirically started on Ciprofloxacin 557m BID x 10 days. Patient's symptoms improved, no indication for further evaluation. Continue probiotic and as needed simethicone over-the-counter. Discussed a low FODMAP diet as well for bloating. Follow up in 1 year or earlier. If symptoms return may need HBT or celiac labs, pt will update [place] ?09/03/21-(Sport Medicine) JRosemarie Ax MD. Seen for left foot pain. Follow up in 2-3 weeks. ? ?  ?[facility]: [facility] ?Medications started at discharge: Aspirin 830mdaily  ? ?12/09/2021 to 12/12/2021 [facility] to continue evaluation for [facility]AVR. ?Medications Stopped / Removed from Med List: HCTZ 2530mMedications Started at Discharge: NONE ?Medication Changes at Discharge: NONE ? ?Objective: ? ?Lab Results  ?Component Value Date  ? CREATININE 1.41 (H) 02/03/2022  ? CREATININE 1.48 (H) 01/27/2022  ? CREATININE 1.50 (H) 01/26/2022  ? ? ?Lab Results  ?Component Value Date  ? HGBA1C 5.7 (H) 01/22/2022  ? ?Last diabetic Eye exam: No results found for: HMDIABEYEEXA  ?Last diabetic Foot exam: No results found for: HMDIABFOOTEX  ? ?   ?Component Value Date/Time  ? CHOL 187 05/04/2021 0852  ? TRIG 315.0 (H) 05/04/2021 0858937 HDL 28.10 (L) 05/04/2021 0853428 CHOLHDL 7  05/04/2021 0852  ? VLDL 63.0 (H) 05/04/2021 [id number] LDLNew Camb[place] 03/06/2021 0413  ? LDLDIRECT 107.0 05/04/2021 0852  ? ? ? ?  Latest Ref Rng & Units 01/22/2022  ? 11:44 AM 12/21/2021  ? 11:46 AM 12/12/2021  ?  3:42 AM  ?Hepatic Function  ?Total Protein 6.5 - 8.1 g/dL 6.3   6.4   6.0    ?Albumin 3.5 - 5.0 g/dL 3.5   4.0   3.2    ?AST 15 - 41 U/L 21   15   21     ?ALT 0 - 44 U/L 17   11   14     ?Alk Phosphatase 38 - 126 U/L 36   34   27    ?Total Bilirubin 0.3 - 1.2 mg/dL 0.4   0.3   0.4    ? ? ?No results found for: TSH, FREET4 ? ? ?  Latest Ref Rng & Units 02/03/2022  ? 10:25 AM 01/27/2022  ?  1:20 AM 01/26/2022  ? 10:05 AM  ?CBC  ?WBC 3.4 - 10.8 x10E3/uL 8.4   8.0     ?Hemoglobin 13.0 - 17.7 g/dL 8.0   7.9   7.5    ?Hematocrit 37.5 - 51.0 % 25.2   25.0   22.0    ?Platelets 150 - 450 x10E3/uL 296   252     ? ? ?Lab Results  ?Component Value Date/Time  ? VD25OH 26.88 (L) 10/24/2017 10:41 AM  ? ? ?Clinical ASCVD:  Yes  ?The ASCVD Risk score (Arnett DK, et al., 2019) failed to calculate for the following reasons: ?  The patient has a prior MI or stroke diagnosis   ? ? ?Social History  ? ?Tobacco Use  ?Smoking Status Former  ? Years: 10.00  ? Types: Cigarettes  ? Quit date: 53  ? Years since quitting: 50.3  ?Smokeless Tobacco Never  ? ?BP Readings from Last 3 Encounters:  ?02/03/22 (!) 142/58  ?01/27/22 134/77  ?01/22/22 138/69  ? ?Pulse Readings from Last 3 Encounters:  ?02/03/22 87  ?01/27/22 87  ?01/22/22 70  ? ?Wt Readings from Last 3 Encounters:  ?02/03/22 235 lb 9.6 oz (106.9 kg)  ?01/27/22 226 lb 2.4 oz (102.6 kg)  ?01/22/22 231 lb 1.6 oz (104.8 kg)  ? ? ?Assessment: Review of patient past medical history, allergies, medications, health status, including review of consultants reports, laboratory and other test data, was performed as part of comprehensive evaluation and provision of chronic care management services.  ? ?SDOH:  (Social Determinants of Health) assessments and interventions performed:  ? ? ? ?CCM Care  Plan ? ?Allergies  ?Allergen Reactions  ? Fluzone Quadrivalent [Influenza Vac Split Quad] Other (See Comments)  ?  "Got the flu"  ? Influenza Vaccines Other (See Comments)  ?  "Got the flu"  ? Aspirin Other (See Comments)

## 2022-02-25 NOTE — Patient Instructions (Signed)
Mr. Hudgins ?It was a pleasure speaking with you  ?Below is a summary of your health goals and care plan ? ?Patient Goals/Self-Care Activities ?take medications as prescribed,  ?check glucose 3 to 4 times a day prior to meals and at bedtime or as needed, document, and provide at future appointments,  ?check blood pressure 2 to 3 times per week, document, and provide at future appointments, and  ?How to Treat a Low Glucose Level:  ?If you have a low blood glucose less than 70, please eat / drink 15 grams of carbohydrates (4 oz of juice, soda (not diet), 4 glucose tablets, or 3-4 pieces of hard candy).  It is best so choose a "quick" source of sugar that does no contain fat (chocolate and peanut butter might take longer to increase your blood glucose) ?Wait 15 minutes and then recheck your blood glucose. If your blood glucose is still less than 70, eat another 15 grams of carbohydrates.  ?Wait another 15 minutes and recheck your glucose.  ?Continue this until your blood glucose is over 70. Once you blood glucose is over 70, eat a snack with protein in it to prevent your blood glucose from dropping again. ?limit use of nasal spray decongestant to prevent rebound congestion - do not use more than 3 day in a row.  ? ? ?If you have any questions or concerns, please feel free to contact me either at the phone number below or with a MyChart message.  ? ?Keep up the good work! ? ?[name], PharmD ?Clinical Pharmacist ?[facility] ?[facility] ?[phone] (direct line)  ?[phone] (main office number) ? ? ? ?Dry Mouth:   ?Current therapy:  ?Over-the-counter dry mouth lozenges ?Interventions: ?Continue to increase water intake and limit sodium ? ? ?Diabetes: ?Diabetes controlled per last A1c but is due to see endocrinologist. A1c goal < 7.0% ? ?Lab Results  ?Component Value Date  ? HGBA1C 5.7 (H) 01/22/2022  ? ?Endocrinologist is Dr Meredith Pel ?Current treatment: ?Tresiba 30 units daily at  bedtime ?Novolog insulin inject 0 to 10 units per sliding scale prior to each meal  ?Interventions:  ?Reviewed home blood glucose readings and reviewed goals  ?Fasting blood glucose goal (before meals) = 80 to 130 ?Blood glucose goal after a meal = less than 180  ? ?Hypertension: ?Controlled; blood pressure goal < 130/80 ?BP Readings from Last 3 Encounters:  ?02/03/22 (!) 142/58  ?01/27/22 134/77  ?01/22/22 138/69  ? ?Current treatment: ?Amlodipine '10mg'$  daily  ?Lisinopril '40mg'$  daily ?Interventions:  ?Continue to take lisinopril and amlodipine (updated prescription for lisinopril) ?Continue to check blood pressure daily and record ? ?Hyperlipidemia, mixed: ?Uncontrolled; LDL goal < 70 and Tg goal < 150 ?Current treatment: ?Fenofibrate '200mg'$  daily with breakfast.  ?Fish oil '1000mg'$  daily ?Ezetimibe '10mg'$  daily  ?Interventions:  ?Continue ezetimibe, fenofibrate and fish oil. ?Recheck lipids in 4 to 6 weeks.  ? ?Medication management ?Pharmacist Clinical Goal(s): ?Over the next 90 days, patient will work with PharmD and providers to maintain optimal medication adherence ?Current pharmacy: [facility] ?Interventions ?Comprehensive medication review performed. ?Reviewed refill history and assessed adherence ?Continue current medication management strategy ?Checking with cardiology office regarding recommendation to take aspirin '81mg'$  (waiting to see if they want you to start now or after abdominal ultrasound is completed.  ?Reminded patient to limit use of nasal spray decongestant - do not use more than 3 day in a row.  ? ? ? ? ? ?Follow Up Plan: Telephone follow up  appointment with care management team member scheduled for:  6 weeks.  ? ?The patient verbalized understanding of instructions, educational materials, and care plan provided today and agreed to receive a mailed copy of patient instructions, educational materials, and care plan.   ?

## 2022-03-01 DIAGNOSIS — Z79891 Long term (current) use of opiate analgesic: Secondary | ICD-10-CM | POA: Diagnosis not present

## 2022-03-01 DIAGNOSIS — E1142 Type 2 diabetes mellitus with diabetic polyneuropathy: Secondary | ICD-10-CM | POA: Diagnosis not present

## 2022-03-01 DIAGNOSIS — Z79899 Other long term (current) drug therapy: Secondary | ICD-10-CM | POA: Diagnosis not present

## 2022-03-01 DIAGNOSIS — M79605 Pain in left leg: Secondary | ICD-10-CM | POA: Diagnosis not present

## 2022-03-01 DIAGNOSIS — M5136 Other intervertebral disc degeneration, lumbar region: Secondary | ICD-10-CM | POA: Diagnosis not present

## 2022-03-01 DIAGNOSIS — M21372 Foot drop, left foot: Secondary | ICD-10-CM | POA: Diagnosis not present

## 2022-03-01 DIAGNOSIS — G894 Chronic pain syndrome: Secondary | ICD-10-CM | POA: Diagnosis not present

## 2022-03-01 DIAGNOSIS — R531 Weakness: Secondary | ICD-10-CM | POA: Diagnosis not present

## 2022-03-01 DIAGNOSIS — M4325 Fusion of spine, thoracolumbar region: Secondary | ICD-10-CM | POA: Diagnosis not present

## 2022-03-02 DIAGNOSIS — M25511 Pain in right shoulder: Secondary | ICD-10-CM | POA: Diagnosis not present

## 2022-03-02 NOTE — Progress Notes (Signed)
HE[facility] [place]                                     Cardiology Office Note:    Date:  03/04/2022   ID:  Tommy Green, DOB 1945-12-12, MRN [id number]  PCP:  [name], DO  [place] Cardiologist:  Shirlee More, MD / Dr. Ali Lowe & Dr. [name] (TAVR) [facility] Electrophysiologist:  None   Referring MD: [name]*   1 month s/p TAVR   History of Present Illness:    Tommy Green is a 76 y.o. male with a hx of childhood rheumatic fever, DMT2, CKD stage III, HTN, HLD, prior stroke, skin cancer, iron deficiency anemia, obesity and severe aortic stenosis who presents today for 1 week follow up s/p TAVR (01/26/22) who presents to clinic for follow up.   Per chart review, he underwent spinal fusion surgery in 01/2021. After the surgery, he became hypotensive requiring ICU level care, Levophed, blood transfusions, IV fluids. Echocardiogram during that admission showed a calcified aortic valve with moderate to severe aortic stenosis, mild LVH, LVEF 55 to 60%. He was then referred to Dr. [name] for evaluation of aortic stenosis.  On discussion, the patient and wife were both very hesitant to consider cardiac interventions. Repeat echocardiogram on 10/21/2021 showed LVEF 50 to 86%, grade 1 diastolic dysfunction, severe aortic valve stenosis, severe calcification of the aortic valve.   He then presented to the ED on 2/22 complaining of dizziness. Reported that he felt different than his previous history of vertigo. At that time, he was evaluated by the structural heart team with plan to pursue TAVR. He underwent R/LHC and CT imaging which showed moderate, nonobstructive coronary artery disease and anatomy suitable for transcatheter aortic valve replacement and underwent TAVR with 29 mm Edwards Sapien 3UR THV via the TF approach on 01/26/22. Post operative echo with normal LVEF and AVA by VTI measures 2.25 cm, mean  gradient measures 11.8 mmHg and no PVL. He was previously taking ASA on a PRN basis and ASA was listed as an allergy. We confirmed with him that he is not allergic to ASA therefore he was started on ASA '81mg'$  QD.   Today he presents to clinic for follow up. He doesn't tire out as quickly as before. Still limited by dyspnea on exetion. CHronic mild LE edema. No orthpnea or PND. Mobility limited by drop foot. No dizziness or syncope. No blood in stool or urine.   Past Medical History:  Diagnosis Date   Anemia    Essential hypertension 07/18/2017   Foot drop, left foot    due to back surgery in 01/2021   GERD (gastroesophageal reflux disease)    History of colonic polyps    History of hiatal hernia    many years ago   History of rheumatic fever    History of stroke 07/18/2017   Hypertriglyceridemia 07/18/2017   S/P TAVR (transcatheter aortic valve replacement) 01/26/2022   Edwards S3UR 2m via TF approach with Dr. [name] Dr. BCyndia Bent  Severe aortic stenosis    Severe aortic stenosis    Type 2 diabetes mellitus with diabetic neuropathy, unspecified [place] 07/18/2017    Past Surgical History:  Procedure Laterality Date   COLONOSCOPY     around 2018 [facility]   COLONOSCOPY  09/02/2020   CRANIOTOMY Left 10/25/2019  Procedure: CRANIOTOMY HEMATOMA EVACUATION SUBDURAL;  Surgeon: Vallarie Mare, MD;  Location: [facility];  Service: Neurosurgery;  Laterality: Left;   ESOPHAGOGASTRODUODENOSCOPY     around 2018 with [facility]   FOOT CAPSULOTOMY Left 07/25/2008   Mid Foot #2 MPJ   Hammertoe Repair Left 07/25/2008   #2 toe   INTRAOPERATIVE TRANSTHORACIC ECHOCARDIOGRAM N/A 01/26/2022   Procedure: INTRAOPERATIVE TRANSTHORACIC ECHOCARDIOGRAM;  Surgeon: Early Osmond, MD;  Location: [place];  Service: Open Heart Surgery;  Laterality: N/A;   RIGHT/LEFT HEART CATH AND CORONARY ANGIOGRAPHY N/A 12/11/2021   Procedure: RIGHT/LEFT HEART CATH AND CORONARY ANGIOGRAPHY;  Surgeon: Jettie Booze, MD;  Location: [facility];  Service: Cardiovascular;  Laterality: N/A;   SPINAL FUSION  01/2021   TARSAL TUNNEL RELEASE Left 07/25/2008   TONSILLECTOMY     removed as a child   TRANSCATHETER AORTIC VALVE REPLACEMENT, TRANSFEMORAL N/A 01/26/2022   Procedure: Transcatheter Aortic Valve Replacement 29MM, Transfemoral;  Surgeon: Early Osmond, MD;  Location: [place];  Service: Open Heart Surgery;  Laterality: N/A;  Percutaneous   UPPER GASTROINTESTINAL ENDOSCOPY  09/02/2020    Current Medications: Current Meds  Medication Sig   acetaminophen (TYLENOL) 325 MG tablet Take 650 mg by mouth every 6 (six) hours as needed for mild pain or headache.   amLODipine (NORVASC) 5 MG tablet Take 1 tablet (5 mg total) by mouth daily.   amoxicillin (AMOXIL) 500 MG tablet Take 4 tablets (2,000 mg total) by mouth as directed. 1 HOUR PRIOR TO DENTAL APPOINTMENTS   aspirin 81 MG chewable tablet Chew 1 tablet (81 mg total) by mouth daily.   B Complex Vitamins (B COMPLEX PO) Take 1 tablet by mouth daily.   Cholecalciferol (VITAMIN D3 SUPER STRENGTH) 50 MCG (2000 UT) CAPS Take 2,000 Units by mouth daily.   Etanercept (ENBREL MINI) 50 MG/ML SOCT Inject 50 mg into the skin once a week.   ezetimibe (ZETIA) 10 MG tablet Take 1 tablet (10 mg total) by mouth daily. For elevated cholesterol and triglycerides   fenofibrate micronized (LOFIBRA) 200 MG capsule TAKE 1 CAPSULE DAILY BEFOREBREAKFAST   HYDROcodone-acetaminophen (NORCO) 10-325 MG tablet Take 0.5 tablets by mouth See admin instructions. Take 0.5 tablet by mouth at bedtime and an additional 0.5 tablet once a day as needed for pain   ibuprofen (ADVIL) 200 MG tablet Take 600 mg by mouth every 6 (six) hours as needed for mild pain or headache.   insulin aspart (NOVOLOG FLEXPEN) 100 UNIT/ML FlexPen Inject 0-10 Units into the skin See admin instructions. Inject 0-10 units into the skin before meals, PER SLIDING SCALE: BGL 0-200 = give nothing, 201-250 = 2  units, 251-300 = 4 units, 301-350 = 6 units, 351-400 = 8 units, >400 = 10 units, push fluids, and repeat BGL check in 2 hours. If BGL remains >400 after 2 hours, CALL MD.   insulin degludec (TRESIBA) 100 UNIT/ML FlexTouch Pen Inject 30 Units into the skin at bedtime.   leflunomide (ARAVA) 10 MG tablet TAKE 1 TABLET BY MOUTH EVERY DAY   lisinopril (ZESTRIL) 40 MG tablet Take 1 tablet (40 mg total) by mouth in the morning.   magnesium oxide (MAG-OX) 400 MG tablet Take 400 mg by mouth daily with lunch.   melatonin 5 MG TABS Take 5 mg by mouth at bedtime.   Multiple Vitamins-Minerals (MULTIVITAMIN WITH MINERALS) tablet Take 1 tablet by mouth daily with breakfast.   Omega 3 1000 MG CAPS Take 1,000 mg by  mouth daily with lunch.   omeprazole (PRILOSEC) 20 MG capsule Take 1 capsule (20 mg total) by mouth daily before breakfast.   ONETOUCH VERIO test strip 4 (four) times daily.   Phenylephrine HCl (SINEX REGULAR NA) Place 1 spray into both nostrils daily as needed.   Potassium 99 MG TABS Take 99 mg by mouth every evening.   vitamin B-12 (CYANOCOBALAMIN) 1000 MCG tablet Take 1,000 mcg by mouth daily.   vitamin E 180 MG (400 UNITS) capsule Take 400 Units by mouth daily.     Allergies:   Fluzone quadrivalent [influenza vac split quad], Influenza vaccines, Aspirin, Atorvastatin, Canagliflozin, and Statins   Social History   Socioeconomic History   Marital status: Married    Spouse name: Not on file   Number of children: 2   Years of education: Not on file   Highest education level: Not on file  Occupational History   Not on file  Tobacco Use   Smoking status: Former    Years: 10.00    Types: Cigarettes    Quit date: 33    Years since quitting: 50.4   Smokeless tobacco: Never  Vaping Use   Vaping Use: Never used  Substance and Sexual Activity   Alcohol use: Not Currently   Drug use: No   Sexual activity: Yes  Other Topics Concern   Not on file  Social History Narrative   Not on file    Social Determinants of Health   Financial Resource Strain: Low Risk    Difficulty of Paying Living Expenses: Not very hard  Food Insecurity: Not on file  Transportation Needs: Not on file  Physical Activity: Insufficiently Active   Days of Exercise per Week: 2 days   Minutes of Exercise per Session: 30 min  Stress: Not on file  Social Connections: Not on file     Family History: The patient's family history includes Alcoholism in his brother; Colon polyps in his mother; Healthy in his son and son; Hyperlipidemia in his father and mother. There is no history of Cancer, Colon cancer, Esophageal cancer, Rectal cancer, or Stomach cancer.  ROS:   Please see the history of present illness.    All other systems reviewed and are negative.  EKGs/Labs/Other Studies Reviewed:    The following studies were reviewed today:   TAVR OPERATIVE NOTE     Date of Procedure:                01/26/2022   Preoperative Diagnosis:      Severe Aortic Stenosis    Postoperative Diagnosis:    Same    Procedure:        Transcatheter Aortic Valve Replacement - Percutaneous Right Transfemoral Approach             Edwards Sapien 3 Ultra THV (size 29 mm, model # 9600TFX, serial # [id number])              Co-Surgeons:                        Gaye Pollack, MD and Lenna Sciara, MD     Anesthesiologist:                  Annye Asa, MD   Echocardiographer:              Jenkins Rouge, MD   Pre-operative Echo Findings: Severe aortic stenosis Normal left ventricular systolic function   Post-operative Echo Findings: No  paravalvular leak Normal left ventricular systolic function     Left Heart Catheterization Findings: Left ventricular end-diastolic pressure of 18 mmHg   _____________   Echo 01/27/22:   1. Left ventricular ejection fraction, by estimation, is 60 to 65%. The  left ventricle has normal function. The left ventricle has no regional  wall motion abnormalities. There is mild  concentric left ventricular  hypertrophy and severe basal septal  hypertrophy.   2. Right ventricular systolic function is normal. The right ventricular  size is normal. Tricuspid regurgitation signal is inadequate for assessing  PA pressure.   3. The mitral valve is normal in structure. Trivial mitral valve  regurgitation. No evidence of mitral stenosis. Moderate mitral annular  calcification.   4. The aortic valve has been repaired/replaced. Aortic valve  regurgitation is trivial. Trivial perivalvular AI at 12:00 on parasternal  short axis view.      No aortic stenosis is present. There is a 29 mm Sapien prosthetic  (TAVR) valve present in the aortic position. Procedure Date: 01/27/2022.  Aortic valve area, by VTI measures 2.25 cm. Aortic valve mean gradient  measures 11.8 mmHg. Aortic valve Vmax  measures 2.25 m/s.   5. The inferior vena cava is normal in size with greater than 50%  respiratory variability, suggesting right atrial pressure of 3 mmHg.   6. Aortic dilatation noted. There is borderline dilatation of the  ascending aorta, measuring 37 mm.   7. Compared to study dated 01/26/2022, there is trivial perivalvular AI.  The mean AVG has increased from 78mHg to 11.815mg.   ______________________  Echo 03/03/22 IMPRESSIONS  1. Left ventricular ejection fraction, by estimation, is 55 to 60%. Left  ventricular ejection fraction by 3D volume is 59 %. The left ventricle has  normal function. The left ventricle has no regional wall motion  abnormalities. There is severe hypertrophy of the basal septal segment. The rest of the LV segments  demonstrate mild left ventricular hypertrophy. Left ventricular diastolic  parameters are consistent with Grade I diastolic dysfunction (impaired  relaxation).   2. Right ventricular systolic function is normal. The right ventricular  size is normal. Tricuspid regurgitation signal is inadequate for assessing  PA pressure.   3. The mitral valve  is abnormal. Trivial mitral valve regurgitation.  Moderate mitral annular calcification.   4. The aortic valve has been repaired/replaced. There is a 29 mm Sapien  prosthetic (TAVR) valve present in the aortic position. Procedure Date:  01/26/22. Echo findings are consistent with normal structure and function  of the aortic valve prosthesis.  Aortic valve mean gradient measures 9.5 mmHg. Aortic valve Vmax measures  2.09 m/s. DI 0.49. No paravalvular leak visualized.   5. Aortic dilatation noted. There is borderline dilatation of the aortic  root, measuring 37 mm. There is borderline dilatation of the ascending  aorta, measuring 38 mm.   6. The inferior vena cava is normal in size with greater than 50%  respiratory variability, suggesting right atrial pressure of 3 mmHg.   Comparison(s): 01/27/22 EF 60-65%. AV 1243m mean PG, 21m18mpeak PG.   EKG:  EKG is NOT ordered today.   Recent Labs: 12/21/2021: Magnesium 1.5 01/22/2022: ALT 17 02/03/2022: BUN 26; Creatinine, Ser 1.41; Hemoglobin 8.0; Platelets 296; Potassium 4.8; Sodium 139   Recent Lipid Panel    Component Value Date/Time   CHOL 187 05/04/2021 0852   TRIG 315.0 (H) 05/04/2021 0852   HDL 28.10 (L) 05/04/2021 0852   CHOLHDL 7 05/04/2021 [id number]  VLDL 63.0 (H) 05/04/2021 0852   LDLCALC 79 03/06/2021 0413   LDLDIRECT 107.0 05/04/2021 0852    Physical Exam:    VS:  BP (!) 144/80   Pulse 84   Ht '5\' 11"'$  (1.803 m)   Wt 231 lb (104.8 kg)   SpO2 96%   BMI 32.22 kg/m     Wt Readings from Last 3 Encounters:  03/03/22 231 lb (104.8 kg)  02/03/22 235 lb 9.6 oz (106.9 kg)  01/27/22 226 lb 2.4 oz (102.6 kg)    General: Well developed, well nourished, NAD Lungs:Clear to ausculation bilaterally. No wheezes, rales, or rhonchi. Breathing is unlabored. Cardiovascular: RRR with S1 S2. No murmur Extremities: No edema. Groin sites stable with no bruising or hematoma.  Neuro: Alert and oriented. No focal deficits. No facial asymmetry. MAE  spontaneously. Psych: Responds to questions appropriately with normal affect.    ASSESSMENT/PLAN:   Severe AS s/p TAVR: echo today shows EF 55%, normally functioning TAVR with a mean gradient of 9.5 mm hg and no PVL. He has NYHA class II symptoms. He has Amoxicillin 2g for dental cleanings and procedures. Continue on aspirin alone. I will see him back in 1 year with an echo.    Post procedure anemia/fatigue: Hb  able around 8. Has follow up with GI.   CKD stage IIIa: Creatinine stable on DOD at 1.48 with a baseline at 1.5.   Hypertension: Bp well controlled. No changes made.    Prior CVA: No new neuro changes. Continue ASA, continue Zetia   Abnormal CT of liver: pre TAVR CT showed "arterially enhancing lesions of the liver measuring up to 1.1 cm, potentially a flash fill hemangiomas; however, evaluation is limited given single phase imaging. Recommend contrast-enhanced liver MRI for further evaluation." This was ordered by Kathyrn Drown and scheduled for 5/31. This has not been approved by their insurance yet   Medication Adjustments/Labs and Tests Ordered: Current medicines are reviewed at length with the patient today.  Concerns regarding medicines are outlined above.  Orders Placed This Encounter  Procedures   ECHOCARDIOGRAM COMPLETE   No orders of the defined types were placed in this encounter.   Patient Instructions  Medication Instructions:  Your physician recommends that you continue on your current medications as directed. Please refer to the Current Medication list given to you today.  *If you need a refill on your cardiac medications before your next appointment, please call your pharmacy*   Lab Work: NONE If you have labs (blood work) drawn today and your tests are completely normal, you will receive your results only by: [place] (if you have MyChart) OR A paper copy in the mail If you have any lab test that is abnormal or we need to change your treatment, we  will call you to review the results.   Testing/Procedures: NONE   Follow-Up: At [facility], you and your health needs are our priority.  As part of our continuing mission to provide you with exceptional heart care, we have created designated Provider Care Teams.  These Care Teams include your primary Cardiologist (physician) and Advanced Practice Providers (APPs -  Physician Assistants and Nurse Practitioners) who all work together to provide you with the care you need, when you need it.  We recommend signing up for the patient portal called "MyChart".  Sign up information is provided on this After Visit Summary.  MyChart is used to connect with patients for Virtual Visits (Telemedicine).  Patients are able to view  lab/test results, encounter notes, upcoming appointments, etc.  Non-urgent messages can be sent to your provider as well.   To learn more about what you can do with MyChart, go to [email or web address].    Your next appointment:   KEEP SCHEDULED FOLLOW-UP  Important Information About Sugar         Weston Brass Angelena Form, PA-C  03/04/2022 10:21 AM    [place]

## 2022-03-03 ENCOUNTER — Ambulatory Visit: Payer: Medicare HMO | Admitting: Physician Assistant

## 2022-03-03 ENCOUNTER — Ambulatory Visit (HOSPITAL_COMMUNITY): Payer: Medicare HMO | Attending: Cardiology

## 2022-03-03 ENCOUNTER — Encounter (INDEPENDENT_AMBULATORY_CARE_PROVIDER_SITE_OTHER): Payer: Self-pay

## 2022-03-03 VITALS — BP 144/80 | HR 84 | Ht 71.0 in | Wt 231.0 lb

## 2022-03-03 DIAGNOSIS — I350 Nonrheumatic aortic (valve) stenosis: Secondary | ICD-10-CM | POA: Diagnosis not present

## 2022-03-03 DIAGNOSIS — D509 Iron deficiency anemia, unspecified: Secondary | ICD-10-CM

## 2022-03-03 DIAGNOSIS — N189 Chronic kidney disease, unspecified: Secondary | ICD-10-CM | POA: Diagnosis not present

## 2022-03-03 DIAGNOSIS — E1169 Type 2 diabetes mellitus with other specified complication: Secondary | ICD-10-CM

## 2022-03-03 DIAGNOSIS — Z952 Presence of prosthetic heart valve: Secondary | ICD-10-CM | POA: Diagnosis not present

## 2022-03-03 DIAGNOSIS — R9389 Abnormal findings on diagnostic imaging of other specified body structures: Secondary | ICD-10-CM | POA: Diagnosis not present

## 2022-03-03 DIAGNOSIS — I10 Essential (primary) hypertension: Secondary | ICD-10-CM | POA: Diagnosis not present

## 2022-03-03 DIAGNOSIS — I639 Cerebral infarction, unspecified: Secondary | ICD-10-CM | POA: Diagnosis not present

## 2022-03-03 LAB — ECHOCARDIOGRAM COMPLETE
AR max vel: 2.21 cm2
AV Area VTI: 2.27 cm2
AV Area mean vel: 2.26 cm2
AV Mean grad: 9.5 mmHg
AV Peak grad: 17.5 mmHg
Ao pk vel: 2.09 m/s
Area-P 1/2: 3.17 cm2
S' Lateral: 3.6 cm

## 2022-03-03 NOTE — Patient Instructions (Signed)
Medication Instructions:  ?Your physician recommends that you continue on your current medications as directed. Please refer to the Current Medication list given to you today.  ?*If you need a refill on your cardiac medications before your next appointment, please call your pharmacy* ? ? ?Lab Work: ?NONE ?If you have labs (blood work) drawn today and your tests are completely normal, you will receive your results only by: ?MyChart Message (if you have MyChart) OR ?A paper copy in the mail ?If you have any lab test that is abnormal or we need to change your treatment, we will call you to review the results. ? ? ?Testing/Procedures: ?NONE ? ? ?Follow-Up: ?At [facility], you and your health needs are our priority.  As part of our continuing mission to provide you with exceptional heart care, we have created designated Provider Care Teams.  These Care Teams include your primary Cardiologist (physician) and Advanced Practice Providers (APPs -  Physician Assistants and Nurse Practitioners) who all work together to provide you with the care you need, when you need it. ? ?We recommend signing up for the patient portal called "MyChart".  Sign up information is provided on this After Visit Summary.  MyChart is used to connect with patients for Virtual Visits (Telemedicine).  Patients are able to view lab/test results, encounter notes, upcoming appointments, etc.  Non-urgent messages can be sent to your provider as well.   ?To learn more about what you can do with MyChart, go to [email or web address].   ? ?Your next appointment:   ?KEEP SCHEDULED FOLLOW-UP ? ?Important Information About Sugar ? ? ? ? ?  ?

## 2022-03-05 ENCOUNTER — Encounter: Payer: Self-pay | Admitting: Family Medicine

## 2022-03-05 ENCOUNTER — Ambulatory Visit (INDEPENDENT_AMBULATORY_CARE_PROVIDER_SITE_OTHER): Payer: Medicare HMO | Admitting: Family Medicine

## 2022-03-05 VITALS — BP 140/84 | HR 90 | Temp 98.4°F | Ht 70.5 in | Wt 233.0 lb

## 2022-03-05 DIAGNOSIS — G8929 Other chronic pain: Secondary | ICD-10-CM | POA: Insufficient documentation

## 2022-03-05 DIAGNOSIS — R600 Localized edema: Secondary | ICD-10-CM | POA: Diagnosis not present

## 2022-03-05 DIAGNOSIS — H6121 Impacted cerumen, right ear: Secondary | ICD-10-CM

## 2022-03-05 DIAGNOSIS — H9203 Otalgia, bilateral: Secondary | ICD-10-CM

## 2022-03-05 MED ORDER — FUROSEMIDE 40 MG PO TABS: 40.0000 mg | ORAL_TABLET | Freq: Every day | ORAL | 3 refills | Status: DC

## 2022-03-05 NOTE — Patient Instructions (Addendum)
Let me know when you need a refill of the hydrocodone.   OK to use Debrox (peroxide) in the ear to loosen up wax. Also recommend using a bulb syringe (for removing boogers from baby's noses) to flush through warm water and vinegar (3-4:1 ratio). An alternative, though more expensive, is an elephant ear washer wax removal kit. Do not use Q-tips as this can impact wax further.  For the swelling in your lower extremities, be sure to elevate your legs when able, mind the salt intake, stay physically active and consider wearing compression stockings.  Let us know if you need anything.

## 2022-03-05 NOTE — Progress Notes (Signed)
Chief Complaint  Patient presents with   Ear Pain    Foot drop Refill hydrocodone     Pt is here for bilateral ear pain- R is worse.  He is here with his wife Fraser Din. Duration: 2 weeks Progression: better Associated symptoms: sore throat, hearing loss Denies: bleeding, or discharge from ear Treatment to date: OTC ear drops  Chronic swelling Patient has a history of swelling in both of his lower extremities going on for several months.  His pain doctor told him that he needs to start on a water pill.  He tried to wear compression stockings but it caused wounds on his lower extremities.  He elevates his legs routinely.  He is working with physical therapy but does not stay as active as he would like to be.  He does not eat foods high in sodium nor does he add salt to his meals.  He denies any new shortness of breath or chest pain.  He had a recent TAVR.  No complications with that.  Chronic pain Patient sees a pain specialist to make some come in once a month to get refills of his hydrocodone.  His pain specialist noted that he could see his primary care doctor for refills.  They are requesting that I refilled his pain medicine so they do not have to pay $25 every month to see the specialist.  Past Medical History:  Diagnosis Date   Anemia    Essential hypertension 07/18/2017   Foot drop, left foot    due to back surgery in 01/2021   GERD (gastroesophageal reflux disease)    History of colonic polyps    History of hiatal hernia    many years ago   History of rheumatic fever    History of stroke 07/18/2017   Hypertriglyceridemia 07/18/2017   S/P TAVR (transcatheter aortic valve replacement) 01/26/2022   [name] 90m via TF approach with Dr. [name] Dr. BCyndia Bent  Severe aortic stenosis    Severe aortic stenosis    Type 2 diabetes mellitus with diabetic neuropathy, unspecified ([place] 07/18/2017    BP 140/84   Pulse 90   Temp 98.4 F (36.9 C) (Oral)   Ht 5' 10.5" (1.791 m)    Wt 233 lb (105.7 kg)   SpO2 97%   BMI 32.96 kg/m  General: Awake, alert, appearing stated age HEENT:  L ear- Canal patent without drainage or erythema, TM is negative R ear- canal 100% obstructed with cerumen Nose- nares patent and without discharge Mouth- Lips, gums and dentition unremarkable, pharynx is without erythema or exudate Neck: No adenopathy Heart- RRR, I do not appreciate any murmurs, 2+ pitting edema tapering at the knee bilaterally MSK: No TTP over the medial calf bilaterally, negative Homans Lungs: Normal effort, no accessory muscle use Psych: Age appropriate judgment and insight, normal mood and affect  Impacted cerumen of right ear  Acute ear pain, bilateral  Bilateral lower extremity edema - Plan: furosemide (LASIX) 40 MG tablet, Basic metabolic panel  1/2. Successful irrigation today.  Over-the-counter/home recommendations as well. 3.  Chronic, not controlled.  Trial Lasix 40 mg daily.  Recheck BMP in 1 week.  I will see him in 2 weeks.  He is on supplemental potassium.  May need to adjust that.  Elevate legs, consider compression over-the-counter strength, stay as active as possible, mind salt intake. 4.  Chronic, currently stable.  His pain specialist is forcing him to come in once a month to get his pain  medication.  He cannot afford this on a fixed income.  He is requesting to see me to refill this.  I will reluctantly do so, we will make sure not to refer to the specialist in the future. Pt and his wife voiced understanding and agreement to the plan.  [place], DO 03/05/22 3:15 PM

## 2022-03-09 DIAGNOSIS — K219 Gastro-esophageal reflux disease without esophagitis: Secondary | ICD-10-CM | POA: Diagnosis not present

## 2022-03-09 DIAGNOSIS — N189 Chronic kidney disease, unspecified: Secondary | ICD-10-CM | POA: Diagnosis not present

## 2022-03-09 DIAGNOSIS — R609 Edema, unspecified: Secondary | ICD-10-CM | POA: Diagnosis not present

## 2022-03-09 DIAGNOSIS — N529 Male erectile dysfunction, unspecified: Secondary | ICD-10-CM | POA: Diagnosis not present

## 2022-03-09 DIAGNOSIS — E785 Hyperlipidemia, unspecified: Secondary | ICD-10-CM | POA: Diagnosis not present

## 2022-03-09 DIAGNOSIS — M1990 Unspecified osteoarthritis, unspecified site: Secondary | ICD-10-CM | POA: Diagnosis not present

## 2022-03-09 DIAGNOSIS — E669 Obesity, unspecified: Secondary | ICD-10-CM | POA: Diagnosis not present

## 2022-03-09 DIAGNOSIS — E1142 Type 2 diabetes mellitus with diabetic polyneuropathy: Secondary | ICD-10-CM | POA: Diagnosis not present

## 2022-03-09 DIAGNOSIS — I10 Essential (primary) hypertension: Secondary | ICD-10-CM | POA: Diagnosis not present

## 2022-03-09 DIAGNOSIS — M069 Rheumatoid arthritis, unspecified: Secondary | ICD-10-CM | POA: Diagnosis not present

## 2022-03-09 DIAGNOSIS — E1122 Type 2 diabetes mellitus with diabetic chronic kidney disease: Secondary | ICD-10-CM | POA: Diagnosis not present

## 2022-03-09 DIAGNOSIS — Z794 Long term (current) use of insulin: Secondary | ICD-10-CM | POA: Diagnosis not present

## 2022-03-09 DIAGNOSIS — Z008 Encounter for other general examination: Secondary | ICD-10-CM | POA: Diagnosis not present

## 2022-03-11 NOTE — Progress Notes (Signed)
Office Visit Note  Patient: Tommy Green             Date of Birth: [birth year]           MRN: [id number]             PCP: [name], DO Referring: [name]* Visit Date: 03/25/2022 Occupation: '@GUAROCC'$ @  Subjective:   Left leg, knee and foot pain   History of Present Illness: Tommy Green is a 76 y.o. male with history of rheumatoid arthritis and osteoarthritis overlap.  He states he has been tolerating Enbrel and leflunomide without any side effects.  He denies any joint swelling except for discomfort in his left lower extremity, left knee, left foot and left ankle.  He states he was in an accident many years ago since then he has had intermittent discomfort in his left lower extremity.  He continues to have some stiffness in his joints due to underlying osteoarthritis which she describes in his hands and his knee joints.  He had aortic valve replacement on January 26, 2022.  Activities of Daily Living:  Patient reports morning stiffness for 30 minutes.   Patient Reports nocturnal pain.  Difficulty dressing/grooming: Reports Difficulty climbing stairs: Reports Difficulty getting out of chair: Denies Difficulty using hands for taps, buttons, cutlery, and/or writing: Denies  Review of Systems  Constitutional:  Positive for fatigue.  HENT:  Positive for mouth dryness.   Eyes:  Negative for dryness.  Respiratory:  Negative for shortness of breath.   Cardiovascular:  Positive for swelling in legs/feet.  Gastrointestinal:  Negative for constipation.  Endocrine: Positive for excessive thirst.  Genitourinary:  Negative for difficulty urinating.  Musculoskeletal:  Positive for joint pain, gait problem, joint pain, joint swelling and morning stiffness.  Skin:  Negative for rash.  Allergic/Immunologic: Negative for susceptible to infections.  Neurological:  Negative for numbness.  Hematological:  Negative for bruising/bleeding tendency.   Psychiatric/Behavioral:  Negative for sleep disturbance.     PMFS History:  Patient Active Problem List   Diagnosis Date Noted   Other chronic pain 03/05/2022   Bilateral lower extremity edema 03/05/2022   Severe aortic stenosis 01/26/2022   S/P TAVR (transcatheter aortic valve replacement) 01/26/2022   Microcytic anemia 12/10/2021   Obesity (BMI 30-39.9) 12/10/2021   Near syncope 12/09/2021   Aortic stenosis 12/09/2021   Rheumatoid arthritis ([place]) 12/09/2021   Foot drop, left foot 10/02/2021   History of colonic polyps 10/02/2021   History of rheumatic fever 10/02/2021   Stress fracture of left foot 09/03/2021   Gabapentin overdose, accidental or unintentional, initial encounter 03/08/2021   TIA (transient ischemic attack) 03/05/2021   Body mass index (BMI) 36.0-36.9, adult 10/03/2020   Subdural hemorrhage following injury without open intracranial wound and with prolonged loss of consciousness (more than 24 hours) without return to pre-existing conscious level ([place]) 10/03/2020   AKI (acute kidney injury) ([place])    Acute encephalopathy    SDH (subdural hematoma) ([place]) 08/11/8526   Cyclic vomiting syndrome 08/06/2019   Neuropathy 12/25/2018   AK (actinic keratosis) 08/24/2018   Neoplasm of uncertain behavior 78/24/2353   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   Tendinopathy of right gluteus medius 07/13/2018   Tendinopathy of left gluteus medius 07/13/2018   Squamous cell carcinoma in situ (SCCIS) of skin 03/24/2018   Vitamin D deficiency 11/29/2017   Stage 3 chronic kidney disease ([place]) 10/28/2017   Primary osteoarthritis of right hip 09/11/2017   Essential hypertension 07/18/2017   Hyperlipidemia  associated with type 2 diabetes mellitus ([place]) 07/18/2017   Type 2 diabetes mellitus with diabetic neuropathy, unspecified ([place]) 07/18/2017   Heart murmur 07/18/2017   Hypertriglyceridemia 07/18/2017   History of stroke 07/18/2017   DDD (degenerative disc disease), lumbar 06/15/2017    Iron deficiency anemia 06/15/2017   Stroke ([place]) 06/15/2017   Encounter for hepatitis C screening test for low risk patient 06/30/2016   Anemia 06/19/2016   Microalbuminuria due to type 2 diabetes mellitus ([place]) 03/19/2016   Hypertrophy of inferior nasal turbinate 12/20/2015   Disease of nasal cavity and sinuses 12/10/2015   Degenerative tear of acetabular labrum of left hip 11/04/2015   History of tobacco abuse 05/15/2015   Morbid (severe) obesity due to excess calories ([place]) 04/11/2015   Disorder of both eustachian tubes 01/27/2015   Maxillary sinusitis 01/13/2015   Lipoprotein deficiency 01/31/2014   Gastro-esophageal reflux disease without esophagitis 11/02/2013   Rheumatic fever 11/02/2013   Erectile dysfunction associated with type 2 diabetes mellitus ([place]) 07/31/2013   Type 2 diabetes mellitus with hyperglycemia, without long-term current use of insulin ([place]) 11/16/2012    Past Medical History:  Diagnosis Date   Anemia    Essential hypertension 07/18/2017   Foot drop, left foot    due to back surgery in 01/2021   GERD (gastroesophageal reflux disease)    History of colonic polyps    History of hiatal hernia    many years ago   History of rheumatic fever    History of stroke 07/18/2017   Hypertriglyceridemia 07/18/2017   S/P TAVR (transcatheter aortic valve replacement) 01/26/2022   Tommy Green 20m via TF approach with Dr. [name] Dr. BCyndia Bent  Severe aortic stenosis    Severe aortic stenosis    Type 2 diabetes mellitus with diabetic neuropathy, unspecified ([place] 07/18/2017    Family History  Problem Relation Age of Onset   Hyperlipidemia Mother    Colon polyps Mother    Hyperlipidemia Father    Alcoholism Brother    Healthy Son    Healthy Son    Cancer Neg Hx    Colon cancer Neg Hx    Esophageal cancer Neg Hx    Rectal cancer Neg Hx    Stomach cancer Neg Hx    Past Surgical History:  Procedure Laterality Date   COLONOSCOPY     around 2018 [facility]   COLONOSCOPY  09/02/2020   CRANIOTOMY Left 10/25/2019   Procedure: CRANIOTOMY HEMATOMA EVACUATION SUBDURAL;  Surgeon: [name] [name];  [facility]  Service: Neurosurgery;  Laterality: Left;   ESOPHAGOGASTRODUODENOSCOPY     around 2018 with [facility]   FOOT CAPSULOTOMY Left 07/25/2008   Mid Foot #2 MPJ   Hammertoe Repair Left 07/25/2008   #2 toe   INTRAOPERATIVE TRANSTHORACIC ECHOCARDIOGRAM N/A 01/26/2022   Procedure: INTRAOPERATIVE TRANSTHORACIC ECHOCARDIOGRAM;  Surgeon: TEarly Osmond MD;  Location[facility]  Service: Open Heart Surgery;  Laterality: N/A;   RIGHT/LEFT HEART CATH AND CORONARY ANGIOGRAPHY N/A 12/11/2021   Procedure: RIGHT/LEFT HEART CATH AND CORONARY ANGIOGRAPHY;  Surgeon: [name];  [facility];  Service: Cardiovascular;  Laterality: N/A;   SPINAL FUSION  01/2021   TARSAL TUNNEL RELEASE Left 07/25/2008   TONSILLECTOMY     removed as a child   TRANSCATHETER AORTIC VALVE REPLACEMENT, TRANSFEMORAL N/A 01/26/2022   Procedure: Transcatheter Aortic Valve Replacement 29MM, Transfemoral;  Surgeon: TEarly Osmond MD;  [facility]  Service: Open  Heart Surgery;  Laterality: N/A;  Percutaneous   UPPER GASTROINTESTINAL ENDOSCOPY  09/02/2020   Social History   Social History Narrative   Not on file   Immunization History  Administered Date(s) Administered   Fluad Quad(high Dose 65+) 08/31/2021   Influenza-Unspecified 07/31/2013, 04/17/2014   Moderna Sars-Covid-2 Vaccination 12/11/2019, 01/10/2020, 08/08/2020   Pneumococcal Conjugate-13 08/07/2015   Pneumococcal Polysaccharide-23 04/17/2013, 07/18/2013   Td 12/25/2018   Zoster, Live 04/17/2013     Objective: Vital Signs: BP (!) 142/77 (BP Location: Left Arm, Patient Position: Sitting, Cuff Size: Normal)   Pulse 71   Resp 16   Ht '5\' 11"'$  (1.803 m)   Wt 229 lb 6.4 oz (104.1 kg)   BMI 31.99 kg/m    Physical Exam Vitals and nursing note reviewed.   Constitutional:      Appearance: He is well-developed.  HENT:     Head: Normocephalic and atraumatic.  Eyes:     Conjunctiva/sclera: Conjunctivae normal.     Pupils: Pupils are equal, round, and reactive to light.  Cardiovascular:     Rate and Rhythm: Normal rate and regular rhythm.     Heart sounds: Normal heart sounds.  Pulmonary:     Effort: Pulmonary effort is normal.     Breath sounds: Normal breath sounds.  Abdominal:     General: Bowel sounds are normal.     Palpations: Abdomen is soft.  Musculoskeletal:     Cervical back: Normal range of motion and neck supple.  Skin:    General: Skin is warm and dry.     Capillary Refill: Capillary refill takes less than 2 seconds.  Neurological:     Mental Status: He is alert and oriented to person, place, and time.  Psychiatric:        Behavior: Behavior normal.      Musculoskeletal Exam: He had good range of motion of his cervical spine.  He has limited range of motion of his lumbar spine without discomfort.  Shoulder joints with good range of motion.  Right elbow joint contracture was noted.  There was no olecranon bursitis.  He had bilateral PIP and DIP thickening.  No synovitis was noted.  Hip joint range of motion could not be assessed in the sitting position.  Both knee joints were in good range of motion.  He had pedal edema.  There was no tenderness on palpation over ankles or MTPs.  CDAI Exam: CDAI Score: 0.6  Patient Global: 4 mm; Provider Global: 2 mm Swollen: 0 ; Tender: 0  Joint Exam 03/25/2022   No joint exam has been documented for this visit   There is currently no information documented on the homunculus. Go to the Rheumatology activity and complete the homunculus joint exam.  Investigation: No additional findings.  Imaging: MR ABDOMEN WWO CONTRAST  Result Date: 03/18/2022 CLINICAL DATA:  Further evaluation of hepatic lesion seen on prior CT. History of skin cancer. EXAM: MRI ABDOMEN WITHOUT AND WITH CONTRAST  TECHNIQUE: Multiplanar multisequence MR imaging of the abdomen was performed both before and after the administration of intravenous contrast. CONTRAST:  29m GADAVIST GADOBUTROL 1 MMOL/ML IV SOLN COMPARISON:  CT December 18, 2021 FINDINGS: Lower chest: No acute abnormality. Hepatobiliary: No significant hepatic steatosis. No MRI evidence of cirrhosis. T2 hyperintense lesion in the lateral right lobe of the liver measuring 13 mm on image 13/6 which demonstrates arterial hyperenhancement which persists throughout delayed phases of imaging similar in intensity to background aorta a benign consistent with hepatic hemangioma.  Additional tiny focus of arterial enhancement seen on prior CT is not seen on today's examination but is also favored benign likely reflecting an intrahepatic shunt or tiny small hemangioma. No suspicious hepatic lesion. Cholelithiasis without findings of acute cholecystitis. No biliary ductal dilation. Pancreas: Intrinsic T1 signal of the pancreatic parenchyma is within normal limits. No pancreatic ductal dilation. Spleen:  Within normal limits in size and appearance. Adrenals/Urinary Tract: Bilateral adrenal glands are normal. No hydronephrosis. Benign left renal cysts measures up to 2 cm in the upper pole no requires no imaging follow-up. No suspicious renal mass. Stomach/Bowel: Colonic diverticulosis. No evidence of bowel obstruction or acute bowel inflammation. Vascular/Lymphatic: Normal caliber abdominal aorta. The portal, splenic and superior mesenteric veins are patent. No pathologically enlarged abdominal lymph nodes. Other:  No significant abdominal free fluid. Musculoskeletal: Posterior thoracolumbar fusion hardware. IMPRESSION: 1. 13 mm lesion in the lateral right lobe of the liver is consistent with a benign hepatic hemangioma. Additional tiny focus of arterial enhancement seen on prior CT is not seen on today's examination but is also favored benign likely reflecting an intrahepatic shunt  or tiny hemangioma. No suspicious hepatic lesion. 2. Cholelithiasis without findings of acute cholecystitis. Electronically Signed   By: Dahlia Bailiff M.D.   On: 03/18/2022 13:40   ECHOCARDIOGRAM COMPLETE  Result Date: 03/03/2022    ECHOCARDIOGRAM REPORT   Patient Name:   DONTAE MINERVA Mckeithan Date of Exam: 03/03/2022 Medical Rec #:  [id number]         Height:       71.0 in Accession #:    [id number]        Weight:       235.6 lb Date of Birth:  20-Nov-1945          BSA:          2.261 m Patient Age:    35 years          BP:           142/58 mmHg Patient Gender: M                 HR:           91 bpm. Exam Location:  [facility] Procedure: 2D Echo, 3D Echo, Cardiac Doppler, Color Doppler and Strain Analysis Indications:    I35 Aortic stenosis  History:        Patient has prior history of Echocardiogram examinations, most                 recent 01/27/2022. Stroke and TIA, Aortic Valve Disease,                 Signs/Symptoms:Syncope and Murmur; Risk Factors:Hypertension,                 Dyslipidemia and Former Smoker. CKD stage 3. Anemia. H/o                 rheumatic heart disease. Obesity.                 Aortic Valve: 29 mm Sapien prosthetic, stented (TAVR) valve is                 present in the aortic position. Procedure Date: 01/26/22.  Sonographer:    Basilia Jumbo BS, RDCS Referring Phys: [place]  1. Left ventricular ejection fraction, by estimation, is 55 to 60%. Left ventricular ejection fraction by 3D volume is 59 %. The left ventricle has normal  function. The left ventricle has no regional wall motion abnormalities. There is severe hypertrophy of the basal septal segment. The rest of the LV segments demonstrate mild left ventricular hypertrophy. Left ventricular diastolic parameters are consistent with Grade I diastolic dysfunction (impaired relaxation).  2. Right ventricular systolic function is normal. The right ventricular size is normal. Tricuspid regurgitation signal is inadequate  for assessing PA pressure.  3. The mitral valve is abnormal. Trivial mitral valve regurgitation. Moderate mitral annular calcification.  4. The aortic valve has been repaired/replaced. There is a 29 mm Sapien prosthetic (TAVR) valve present in the aortic position. Procedure Date: 01/26/22. Echo findings are consistent with normal structure and function of the aortic valve prosthesis. Aortic valve mean gradient measures 9.5 mmHg. Aortic valve Vmax measures 2.09 m/s. DI 0.49. No paravalvular leak visualized.  5. Aortic dilatation noted. There is borderline dilatation of the aortic root, measuring 37 mm. There is borderline dilatation of the ascending aorta, measuring 38 mm.  6. The inferior vena cava is normal in size with greater than 50% respiratory variability, suggesting right atrial pressure of 3 mmHg. Comparison(s): 01/27/22 EF 60-65%. AV 33mHg mean PG, 267mg peak PG. FINDINGS  Left Ventricle: Left ventricular ejection fraction, by estimation, is 55 to 60%. Left ventricular ejection fraction by 3D volume is 59 %. The left ventricle has normal function. The left ventricle has no regional wall motion abnormalities. The left ventricular internal cavity size was normal in size. There is severe hypertrophy of the basal septal segment. The rest of the LV segments demonstrate mild left ventricular hypertrophy. Left ventricular diastolic parameters are consistent with Grade I diastolic dysfunction (impaired relaxation). Right Ventricle: The right ventricular size is normal. No increase in right ventricular wall thickness. Right ventricular systolic function is normal. Tricuspid regurgitation signal is inadequate for assessing PA pressure. Left Atrium: Left atrial size was normal in size. Right Atrium: Right atrial size was normal in size. Pericardium: There is no evidence of pericardial effusion. Mitral Valve: The mitral valve is abnormal. There is mild thickening of the mitral valve leaflet(s). There is mild  calcification of the mitral valve leaflet(s). Moderate mitral annular calcification. Trivial mitral valve regurgitation. Tricuspid Valve: The tricuspid valve is normal in structure. Tricuspid valve regurgitation is trivial. Aortic Valve: DI 0.49. The aortic valve has been repaired/replaced. Aortic valve regurgitation is not visualized. Aortic valve mean gradient measures 9.5 mmHg. Aortic valve peak gradient measures 17.5 mmHg. Aortic valve area, by VTI measures 2.27 cm. There is a 29 mm Sapien prosthetic, stented (TAVR) valve present in the aortic position. Procedure Date: 01/26/22. Echo findings are consistent with normal structure and function of the aortic valve prosthesis. Pulmonic Valve: The pulmonic valve was normal in structure. Pulmonic valve regurgitation is trivial. Aorta: Aortic dilatation noted. There is borderline dilatation of the aortic root, measuring 37 mm. There is borderline dilatation of the ascending aorta, measuring 38 mm. Venous: The inferior vena cava is normal in size with greater than 50% respiratory variability, suggesting right atrial pressure of 3 mmHg. IAS/Shunts: The atrial septum is grossly normal.  LEFT VENTRICLE PLAX 2D LVIDd:         4.90 cm         Diastology LVIDs:         3.60 cm         LV e' medial:    6.15 cm/s LV PW:         1.20 cm         LV E/e'  medial:  14.1 LV IVS:        0.90 cm         LV e' lateral:   6.75 cm/s LVOT diam:     2.40 cm         LV E/e' lateral: 12.8 LV SV:         95 LV SV Index:   42              2D LVOT Area:     4.52 cm        Longitudinal                                Strain                                2D Strain GLS  -18.3 %                                (A2C):                                2D Strain GLS  -19.4 %                                (A3C):                                2D Strain GLS  -21.8 %                                (A4C):                                2D Strain GLS  -19.8 %                                Avg:                                  3D Volume EF                                LV 3D EF:    Left                                             ventricul                                             ar  ejection                                             fraction                                             by 3D                                             volume is                                             59 %.                                 3D Volume EF:                                3D EF:        59 %                                LV EDV:       178 ml                                LV ESV:       72 ml                                LV SV:        106 ml RIGHT VENTRICLE             IVC RV Basal diam:  3.30 cm     IVC diam: 1.90 cm RV S prime:     15.40 cm/s TAPSE (M-mode): 2.7 cm LEFT ATRIUM             Index        RIGHT ATRIUM           Index LA diam:        4.70 cm 2.08 cm/m   RA Pressure: 3.00 mmHg LA Vol (A2C):   63.3 ml 28.00 ml/m  RA Area:     14.10 cm LA Vol (A4C):   62.6 ml 27.69 ml/m  RA Volume:   37.60 ml  16.63 ml/m LA Biplane Vol: 64.3 ml 28.44 ml/m  AORTIC VALVE AV Area (Vmax):    2.21 cm AV Area (Vmean):   2.26 cm AV Area (VTI):     2.27 cm AV Vmax:           209.00 cm/s AV Vmean:          136.600 cm/s AV VTI:            0.420 m AV Peak Grad:  17.5 mmHg AV Mean Grad:      9.5 mmHg LVOT Vmax:         102.00 cm/s LVOT Vmean:        68.100 cm/s LVOT VTI:          0.211 m LVOT/AV VTI ratio: 0.50  AORTA Ao Root diam: 3.70 cm Ao Asc diam:  3.80 cm MITRAL VALVE                TRICUSPID VALVE                             Estimated RAP:  3.00 mmHg MV Decel Time: 239 msec MV E velocity: 86.50 cm/s   SHUNTS MV A velocity: 139.00 cm/s  Systemic VTI:  0.21 m MV E/A ratio:  0.62         Systemic Diam: 2.40 cm Gwyndolyn Kaufman MD Electronically signed by Gwyndolyn Kaufman MD Signature Date/Time: 03/03/2022/6:05:44 PM    Final     Recent Labs: Lab Results  Component Value  Date   WBC 8.4 02/03/2022   HGB 8.0 (L) 02/03/2022   PLT 296 02/03/2022   NA 138 03/12/2022   K 4.6 03/12/2022   CL 103 03/12/2022   CO2 23 03/12/2022   GLUCOSE 129 (H) 03/12/2022   BUN 36 (H) 03/12/2022   CREATININE 1.68 (H) 03/12/2022   BILITOT 0.4 01/22/2022   ALKPHOS 36 (L) 01/22/2022   AST 21 01/22/2022   ALT 17 01/22/2022   PROT 6.3 (L) 01/22/2022   ALBUMIN 3.5 01/22/2022   CALCIUM 9.4 03/12/2022   GFRAA 48 (L) 07/15/2020   QFTBGOLDPLUS NEGATIVE 06/16/2021    Speciality Comments: No specialty comments available.  Procedures:  No procedures performed Allergies: Fluzone quadrivalent [influenza vac split quad], Influenza vaccines, Aspirin, Atorvastatin, Canagliflozin, and Statins   Assessment / Plan:     Visit Diagnoses: Rheumatoid arthritis involving multiple sites with positive rheumatoid factor (HCC) - Positive RF, positive anti-CCP, elevated sedimentation rate, history of inflammatory arthritis: He had no synovitis on examination.  He appears to be doing well on the combination of leflunomide 10 mg p.o. daily and Enbrel 50 mg subcu weekly.  He has been tolerating both medications well.  He continues to have some discomfort in his left lower extremity due to previous accident.  He also have some pedal edema which may be contributing to the discomfort.  High risk medication use - Arava 10 mg 1 tablet by mouth daily and Enbrel mini 50 mg sq injections once weekly. -Labs obtained on February 03, 2022 were reviewed.  He continues to be anemic with hemoglobin of 8.0.  Creatinine is elevated and stable.  His creatinine went up recently which was ordered by Dr. Nani Ravens after increasing the Lasix.  TB gold was negative on June 16, 2021.  We will check TB Gold with his next labs in July.  Plan: QuantiFERON-TB Gold Plus.  Information regarding immunization was placed in the AVS.  He was advised to hold Enbrel and leflunomide if he develops an infection and resume after the infection  resolves.  Annual skin examination to screen for skin cancer was also advised while he is on Enbrel.  Contracture of right elbow-mild contracture was noted.  There was no olecranon bursitis.  Primary osteoarthritis of both hips - Moderate to severe osteoarthritis of both hips.  Range of motion was difficult to assess in the sitting position.  Primary osteoarthritis of left knee-he  had good range of motion without discomfort.  Pain in left ankle and joints of left foot-patient gives history of left lower extremity ankle and foot pain since he had an accident several years ago intermittently.  No synovitis was noted.  DDD (degenerative disc disease), lumbar -his chronic discomfort in his lower back.  He had L4-L5 fusion by Dr.Cohen.  Facet joint arthropathy.   He underwent a lumbar fusion in April 2022.    Essential hypertension-blood pressure was elevated today.  S/P TAVR (transcatheter aortic valve replacement) - January 26, 2022  SDH (subdural hematoma) (HCC)  Hyperlipidemia associated with type 2 diabetes mellitus ([place])  History of stroke  Granuloma annulare  Type 2 diabetes mellitus with diabetic neuropathy, with long-term current use of insulin (HCC)  Squamous cell carcinoma in situ (SCCIS) of skin  Orders: Orders Placed This Encounter  Procedures   QuantiFERON-TB Gold Plus   No orders of the defined types were placed in this encounter.  .  Follow-Up Instructions: Return in about 5 months (around 08/25/2022) for Rheumatoid arthritis, Osteoarthritis.   Bo Merino, MD  Note - This record has been created using Editor, commissioning.  Chart creation errors have been sought, but may not always  have been located. Such creation errors do not reflect on  the standard of medical care.

## 2022-03-12 ENCOUNTER — Other Ambulatory Visit (INDEPENDENT_AMBULATORY_CARE_PROVIDER_SITE_OTHER): Payer: Medicare HMO

## 2022-03-12 ENCOUNTER — Telehealth: Payer: Self-pay | Admitting: Family Medicine

## 2022-03-12 DIAGNOSIS — R600 Localized edema: Secondary | ICD-10-CM | POA: Diagnosis not present

## 2022-03-12 LAB — BASIC METABOLIC PANEL
BUN: 36 mg/dL — ABNORMAL HIGH (ref 6–23)
CO2: 23 mEq/L (ref 19–32)
Calcium: 9.4 mg/dL (ref 8.4–10.5)
Chloride: 103 mEq/L (ref 96–112)
Creatinine, Ser: 1.68 mg/dL — ABNORMAL HIGH (ref 0.40–1.50)
GFR: 38.78 mL/min — ABNORMAL LOW (ref >60.00)
Glucose, Bld: 129 mg/dL — ABNORMAL HIGH (ref 70–99)
Potassium: 4.6 mEq/L (ref 3.5–5.1)
Sodium: 138 mEq/L (ref 135–145)

## 2022-03-12 MED ORDER — PREDNISONE 20 MG PO TABS: 40.0000 mg | ORAL_TABLET | Freq: Every day | ORAL | 0 refills | Status: AC

## 2022-03-12 NOTE — Telephone Encounter (Signed)
Called the patient left detailed message medication sent in to his pharmacy Was ok to leave a message.

## 2022-03-12 NOTE — Telephone Encounter (Signed)
5 d prednisone burst sent for ETD. Ty.

## 2022-03-12 NOTE — Telephone Encounter (Signed)
The patients wife called in to let PCP know the patients ear (that was flushed out) is still hurting. If anything gets called in needs to go to [facility] on [address]

## 2022-03-17 ENCOUNTER — Ambulatory Visit (HOSPITAL_BASED_OUTPATIENT_CLINIC_OR_DEPARTMENT_OTHER)
Admission: RE | Admit: 2022-03-17 | Discharge: 2022-03-17 | Disposition: A | Discharge status: Home / Self Care | Payer: Medicare HMO | Source: Ambulatory Visit | Attending: Cardiology | Admitting: Cardiology

## 2022-03-17 DIAGNOSIS — Z87891 Personal history of nicotine dependence: Secondary | ICD-10-CM | POA: Diagnosis not present

## 2022-03-17 DIAGNOSIS — K8020 Calculus of gallbladder without cholecystitis without obstruction: Secondary | ICD-10-CM | POA: Diagnosis not present

## 2022-03-17 DIAGNOSIS — I350 Nonrheumatic aortic (valve) stenosis: Secondary | ICD-10-CM | POA: Insufficient documentation

## 2022-03-17 DIAGNOSIS — Z794 Long term (current) use of insulin: Secondary | ICD-10-CM

## 2022-03-17 DIAGNOSIS — E1169 Type 2 diabetes mellitus with other specified complication: Secondary | ICD-10-CM

## 2022-03-17 DIAGNOSIS — R9389 Abnormal findings on diagnostic imaging of other specified body structures: Secondary | ICD-10-CM | POA: Insufficient documentation

## 2022-03-17 DIAGNOSIS — E1122 Type 2 diabetes mellitus with diabetic chronic kidney disease: Secondary | ICD-10-CM | POA: Diagnosis not present

## 2022-03-17 DIAGNOSIS — K769 Liver disease, unspecified: Secondary | ICD-10-CM | POA: Diagnosis not present

## 2022-03-17 DIAGNOSIS — N1831 Chronic kidney disease, stage 3a: Secondary | ICD-10-CM

## 2022-03-17 DIAGNOSIS — Z85828 Personal history of other malignant neoplasm of skin: Secondary | ICD-10-CM | POA: Diagnosis not present

## 2022-03-17 DIAGNOSIS — E785 Hyperlipidemia, unspecified: Secondary | ICD-10-CM

## 2022-03-17 DIAGNOSIS — I10 Essential (primary) hypertension: Secondary | ICD-10-CM

## 2022-03-17 DIAGNOSIS — I129 Hypertensive chronic kidney disease with stage 1 through stage 4 chronic kidney disease, or unspecified chronic kidney disease: Secondary | ICD-10-CM

## 2022-03-17 DIAGNOSIS — K5730 Diverticulosis of large intestine without perforation or abscess without bleeding: Secondary | ICD-10-CM | POA: Diagnosis not present

## 2022-03-17 IMAGING — MR MR ABDOMEN WO/W CM
16 of 17 series · 44 of 48 positions shown · IV contrast (GADAVIST)
Comparison: CT [DATE]

CLINICAL DATA: Further evaluation of hepatic lesion seen on prior
CT. History of skin cancer.

EXAM:
MRI ABDOMEN WITHOUT AND WITH CONTRAST
TECHNIQUE: Multiplanar multisequence MR imaging of the abdomen was performed
both before and after the administration of intravenous contrast.
CONTRAST:  10mL GADAVIST GADOBUTROL 1 MMOL/ML IV SOLN

[Series 5: cor ssfse / · coronal · 6.0mm · 1.76mm/px · 3 of 48 slices shown]
[im 1/48]
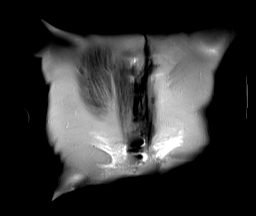
[im 24/48]
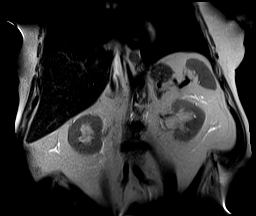
[im 48/48]
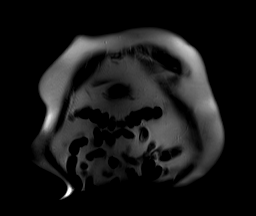

[Series 6: T2 fat-sat · axial · 7.0mm · 1.76mm/px · z∈[-119,+174]mm · 2 of 36 slices shown]
[im 1/36]
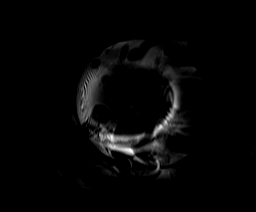
[im 36/36]
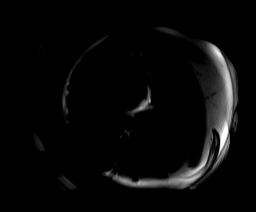

[Series 8: DWI · axial · 7.0mm · 2.34mm/px · z∈[-100,+227]mm · 5 of 119 slices shown (1 of 2)]
[im 1/119]
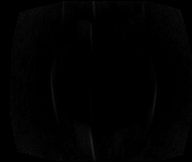
[im 30/119]
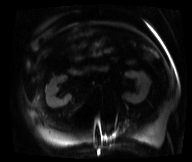
[im 60/119]
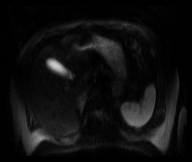
[im 89/119]
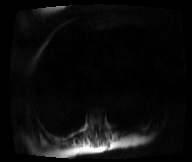
[im 119/119]
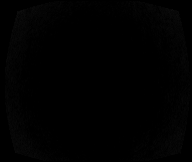

[Series 9: DWI · axial · 7.0mm · 2.34mm/px · z∈[-100,+227]mm · 2 of 40 slices shown (2 of 2)]
[im 1/40]
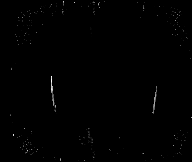
[im 40/40]
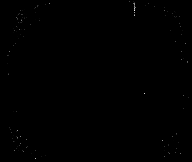

[Series 10: T1 · axial · 7.0mm · 0.88mm/px · z∈[-119,+174]mm · 3 of 72 slices shown]
[im 1/72]
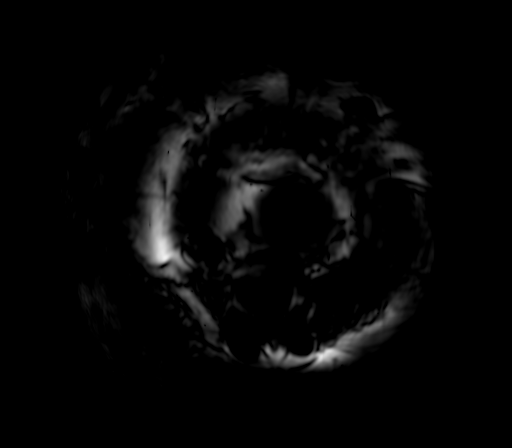
[im 36/72]
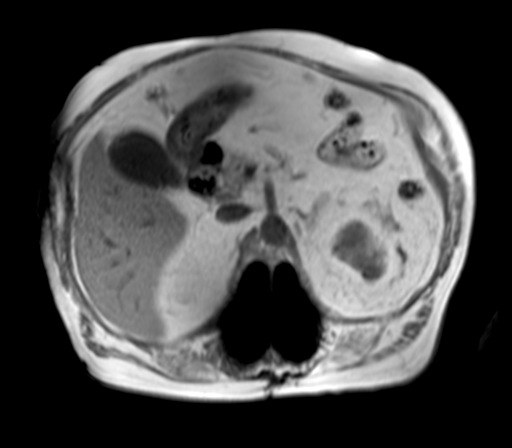
[im 72/72]
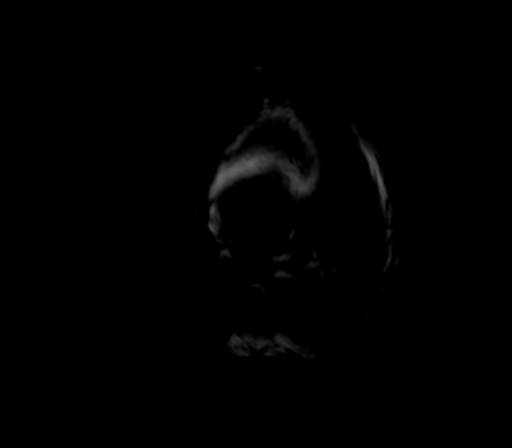

[Series 12: bSSFP · axial · 7.0mm · 0.88mm/px · 1 of 36 slices shown]
[im 1/36]
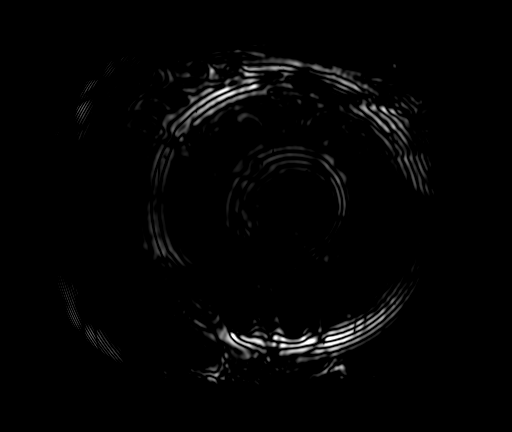

[Series 13: axial dynamic pre · axial · non-contrast · 4.0mm · 1.41mm/px · z∈[-127,+188]mm · 3 of 80 slices shown]
[im 1/80]
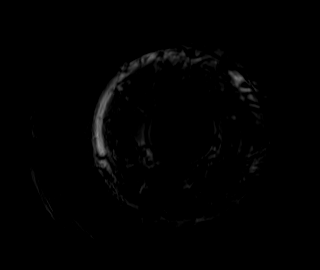
[im 40/80]
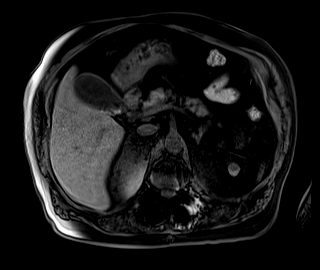
[im 80/80]
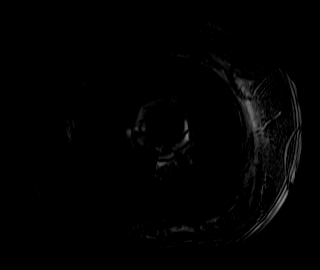

[Series 14: axial dynamic post · axial · 4.0mm · 1.41mm/px · z∈[-127,+188]mm · 3 of 80 slices shown (1 of 6)]
[im 1/80]
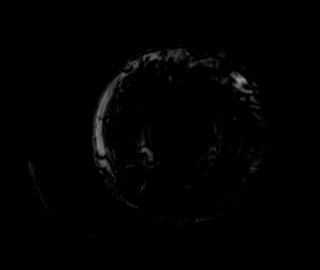
[im 40/80]
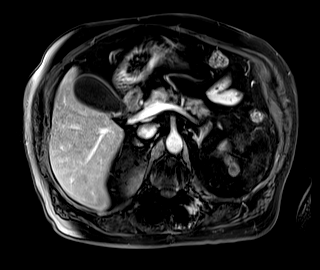
[im 80/80]
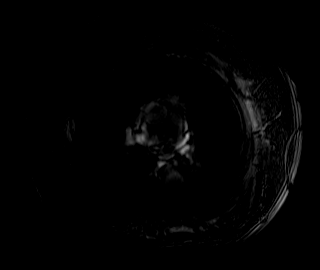

[Series 15: axial dynamic post · axial · 4.0mm · 1.41mm/px · z∈[-127,+188]mm · 3 of 80 slices shown (2 of 6)]
[im 1/80]
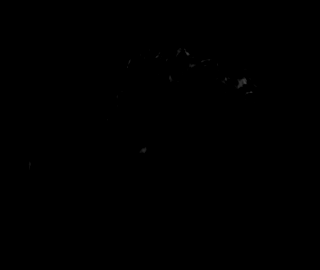
[im 40/80]
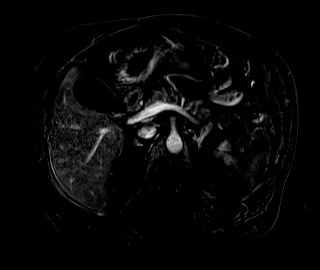
[im 80/80]
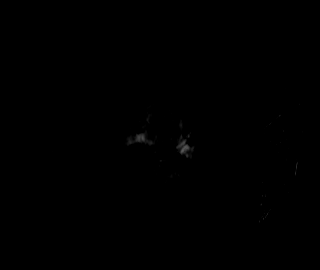

[Series 16: axial dynamic post · axial · 4.0mm · 1.41mm/px · z∈[-127,+188]mm · 3 of 80 slices shown (3 of 6)]
[im 1/80]
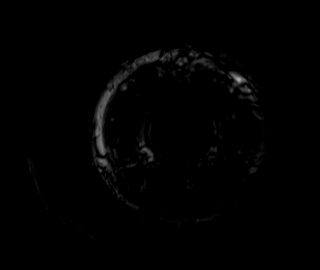
[im 40/80]
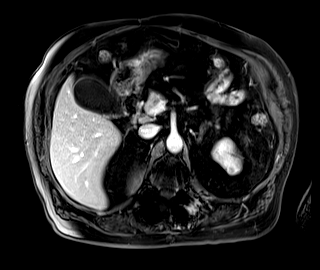
[im 80/80]
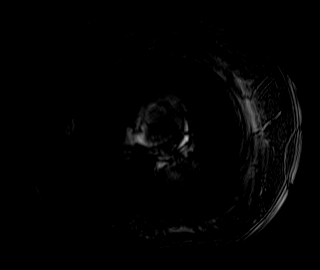

[Series 17: axial dynamic post · axial · 4.0mm · 1.41mm/px · z∈[-127,+188]mm · 3 of 80 slices shown (4 of 6)]
[im 1/80]
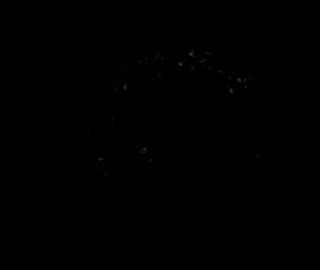
[im 40/80]
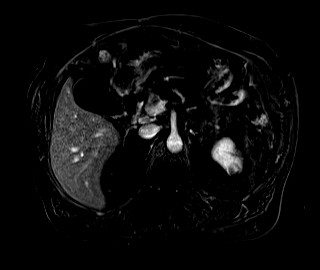
[im 80/80]
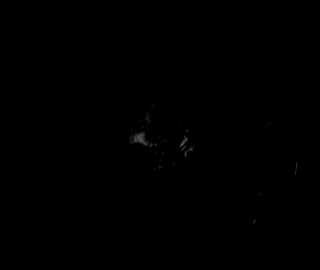

[Series 18: axial dynamic post · axial · 4.0mm · 1.41mm/px · z∈[-127,+188]mm · 3 of 80 slices shown (5 of 6)]
[im 1/80]
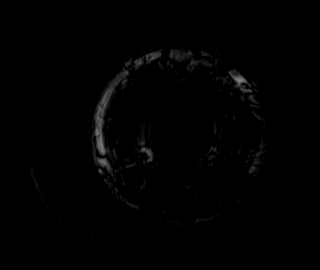
[im 40/80]
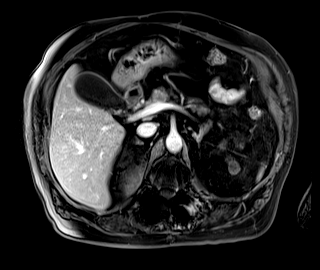
[im 80/80]
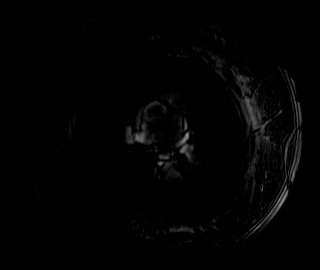

[Series 19: axial dynamic post · axial · 4.0mm · 1.41mm/px · z∈[-127,+188]mm · 3 of 80 slices shown (6 of 6)]
[im 1/80]
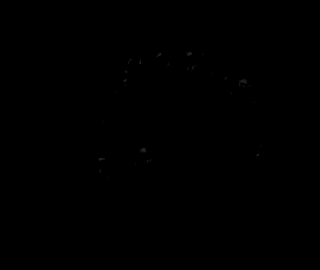
[im 40/80]
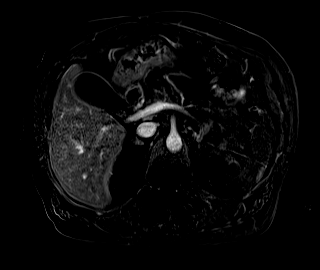
[im 80/80]
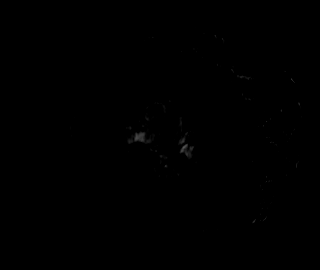

[Series 21: axial ssfse / · axial · 7.0mm · 1.41mm/px · 1 of 36 slices shown]
[im 1/36]
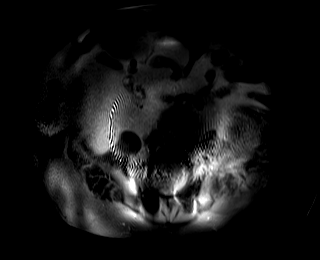

[Series 22: axial dynamic 3min · axial · 4.0mm · 1.41mm/px · z∈[-127,+188]mm · 3 of 80 slices shown (1 of 2)]
[im 1/80]
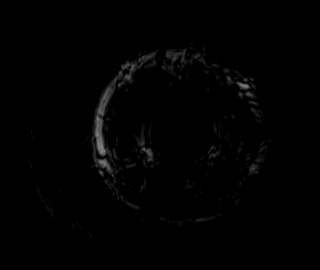
[im 40/80]
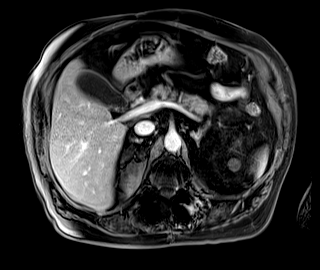
[im 80/80]
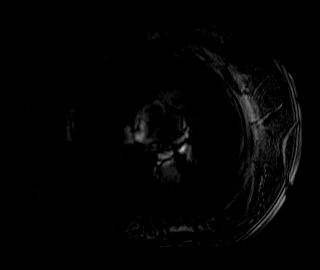

[Series 23: axial dynamic 3min · axial · 4.0mm · 1.41mm/px · z∈[-127,+188]mm · 3 of 80 slices shown (2 of 2)]
[im 1/80]
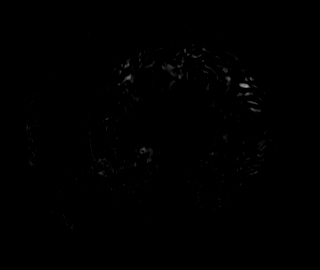
[im 40/80]
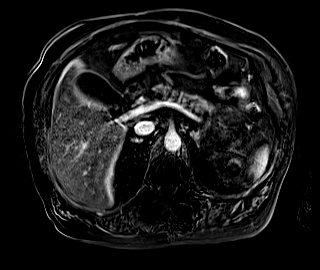
[im 80/80]
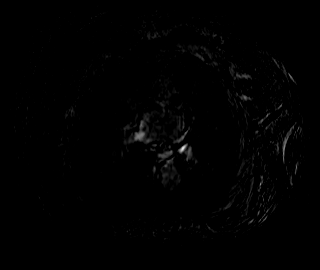

[44 of 48 positions shown; findings below may reference images not displayed]

FINDINGS: Lower chest: No acute abnormality.

Hepatobiliary: No significant hepatic steatosis. No MRI evidence of
cirrhosis.

T2 hyperintense lesion in the lateral right lobe of the liver
measuring 13 mm on image [DATE] which demonstrates arterial
hyperenhancement which persists throughout delayed phases of imaging
similar in intensity to background aorta a benign consistent with
hepatic hemangioma. Additional tiny focus of arterial enhancement
seen on prior CT is not seen on today's examination but is also
favored benign likely reflecting an intrahepatic shunt or tiny small
hemangioma. No suspicious hepatic lesion. Cholelithiasis without
findings of acute cholecystitis. No biliary ductal dilation.

Pancreas: Intrinsic T1 signal of the pancreatic parenchyma is within
normal limits. No pancreatic ductal dilation.

Spleen:  Within normal limits in size and appearance.

Adrenals/Urinary Tract: Bilateral adrenal glands are normal. No
hydronephrosis. Benign left renal cysts measures up to 2 cm in the
upper pole no requires no imaging follow-up. No suspicious renal
mass.

Stomach/Bowel: Colonic diverticulosis. No evidence of bowel
obstruction or acute bowel inflammation.

Vascular/Lymphatic: Normal caliber abdominal aorta. The portal,
splenic and superior mesenteric veins are patent. No pathologically
enlarged abdominal lymph nodes.

Other:  No significant abdominal free fluid.

Musculoskeletal: Posterior thoracolumbar fusion hardware.
IMPRESSION: 1. 13 mm lesion in the lateral right lobe of the liver is consistent
with a benign hepatic hemangioma. Additional tiny focus of arterial
enhancement seen on prior CT is not seen on today's examination but
is also favored benign likely reflecting an intrahepatic shunt or
tiny hemangioma. No suspicious hepatic lesion.
2. Cholelithiasis without findings of acute cholecystitis.

## 2022-03-17 MED ORDER — GADOBUTROL 1 MMOL/ML IV SOLN
10.0000 mL | Freq: Once | INTRAVENOUS | Status: AC | PRN
  Administered 2022-03-17: 10 mL via INTRAVENOUS

## 2022-03-22 ENCOUNTER — Encounter: Payer: Self-pay | Admitting: Family Medicine

## 2022-03-22 ENCOUNTER — Ambulatory Visit (INDEPENDENT_AMBULATORY_CARE_PROVIDER_SITE_OTHER): Payer: Medicare HMO | Admitting: Family Medicine

## 2022-03-22 ENCOUNTER — Ambulatory Visit: Payer: Medicare HMO | Admitting: Family Medicine

## 2022-03-22 VITALS — BP 130/74 | HR 94 | Temp 98.0°F | Resp 16 | Ht 71.0 in | Wt 233.8 lb

## 2022-03-22 DIAGNOSIS — R600 Localized edema: Secondary | ICD-10-CM

## 2022-03-22 MED ORDER — FUROSEMIDE 80 MG PO TABS: 80.0000 mg | ORAL_TABLET | Freq: Every day | ORAL | 3 refills | Status: DC

## 2022-03-22 NOTE — Progress Notes (Signed)
Chief Complaint  Patient presents with   Follow-up    Follow up    Subjective: Patient is a 76 y.o. male here for f/u LE edema. Here w wife.   Patient started on Lasix 40 mg daily for lower extremity swelling.  Helped little but he still having some bowel legs.  He is exercising and elevating his legs.  He tries to mind his salt intake.  He is not wearing any compression stockings.  No chest pain, shortness of breath, wheezing, coughing, weight gain.  He is compliant with the medication.  Starts to urinate around 45 minutes after administration.  No calf pain.  Past Medical History:  Diagnosis Date   Anemia    Essential hypertension 07/18/2017   Foot drop, left foot    due to back surgery in 01/2021   GERD (gastroesophageal reflux disease)    History of colonic polyps    History of hiatal hernia    many years ago   History of rheumatic fever    History of stroke 07/18/2017   Hypertriglyceridemia 07/18/2017   S/P TAVR (transcatheter aortic valve replacement) 01/26/2022   Tommy Green 1m via TF approach with Dr. [name] Dr. BCyndia Bent  Severe aortic stenosis    Severe aortic stenosis    Type 2 diabetes mellitus with diabetic neuropathy, unspecified ([place] 07/18/2017    Objective: BP 130/74 (BP Location: Right Arm, Patient Position: Sitting, Cuff Size: Normal)   Pulse 94   Temp 98 F (36.7 C) (Oral)   Resp 16   Ht '5\' 11"'$  (1.803 m)   Wt 233 lb 12.8 oz (106.1 kg)   SpO2 97%   BMI 32.61 kg/m  General: Awake, appears stated age Heart: RRR, 2+ pitting bilateral LE edema tapering at the knee Lungs: CTAB, no rales, wheezes or rhonchi. No accessory muscle use MSK: No calf pain bilaterally Psych: Age appropriate judgment and insight, normal affect and mood  Assessment and Plan: Bilateral lower extremity edema - Plan: Basic metabolic panel  Chronic, uncontrolled.  Increase Lasix from 40 mg daily to 80 mg daily.  Likely is a component of heart failure.  We will check BMP in 1  week.  He takes a potassium supplement at baseline, last check was 4.6.  Mind salt intake, elevate legs, compression stockings recommended, stay physically active.  I will see him in 2 weeks. The patient and his spouse voiced understanding and agreement to the plan.  N[place]mer[place] DO 03/22/22  4:02 PM

## 2022-03-22 NOTE — Patient Instructions (Addendum)
For the swelling in your lower extremities, be sure to elevate your legs when able, mind the salt intake, stay physically active and consider wearing compression stockings.  Come back for labs again in 1 week.   Start 2 tabs daily of the Lasix starting tomorrow.   Let us know if you need anything.

## 2022-03-25 ENCOUNTER — Encounter: Payer: Self-pay | Admitting: Rheumatology

## 2022-03-25 ENCOUNTER — Ambulatory Visit: Payer: Medicare HMO | Admitting: Rheumatology

## 2022-03-25 VITALS — BP 142/77 | HR 71 | Resp 16 | Ht 71.0 in | Wt 229.4 lb

## 2022-03-25 DIAGNOSIS — D049 Carcinoma in situ of skin, unspecified: Secondary | ICD-10-CM

## 2022-03-25 DIAGNOSIS — E785 Hyperlipidemia, unspecified: Secondary | ICD-10-CM

## 2022-03-25 DIAGNOSIS — S065XAA Traumatic subdural hemorrhage with loss of consciousness status unknown, initial encounter: Secondary | ICD-10-CM

## 2022-03-25 DIAGNOSIS — M160 Bilateral primary osteoarthritis of hip: Secondary | ICD-10-CM | POA: Diagnosis not present

## 2022-03-25 DIAGNOSIS — Z8673 Personal history of transient ischemic attack (TIA), and cerebral infarction without residual deficits: Secondary | ICD-10-CM | POA: Diagnosis not present

## 2022-03-25 DIAGNOSIS — E1169 Type 2 diabetes mellitus with other specified complication: Secondary | ICD-10-CM | POA: Diagnosis not present

## 2022-03-25 DIAGNOSIS — Z952 Presence of prosthetic heart valve: Secondary | ICD-10-CM | POA: Diagnosis not present

## 2022-03-25 DIAGNOSIS — M1712 Unilateral primary osteoarthritis, left knee: Secondary | ICD-10-CM

## 2022-03-25 DIAGNOSIS — M5136 Other intervertebral disc degeneration, lumbar region: Secondary | ICD-10-CM

## 2022-03-25 DIAGNOSIS — E1140 Type 2 diabetes mellitus with diabetic neuropathy, unspecified: Secondary | ICD-10-CM

## 2022-03-25 DIAGNOSIS — I10 Essential (primary) hypertension: Secondary | ICD-10-CM

## 2022-03-25 DIAGNOSIS — Z79899 Other long term (current) drug therapy: Secondary | ICD-10-CM

## 2022-03-25 DIAGNOSIS — M24521 Contracture, right elbow: Secondary | ICD-10-CM | POA: Diagnosis not present

## 2022-03-25 DIAGNOSIS — Z794 Long term (current) use of insulin: Secondary | ICD-10-CM

## 2022-03-25 DIAGNOSIS — M0579 Rheumatoid arthritis with rheumatoid factor of multiple sites without organ or systems involvement: Secondary | ICD-10-CM | POA: Diagnosis not present

## 2022-03-25 DIAGNOSIS — M25572 Pain in left ankle and joints of left foot: Secondary | ICD-10-CM

## 2022-03-25 DIAGNOSIS — R011 Cardiac murmur, unspecified: Secondary | ICD-10-CM

## 2022-03-25 DIAGNOSIS — M7021 Olecranon bursitis, right elbow: Secondary | ICD-10-CM

## 2022-03-25 DIAGNOSIS — L920 Granuloma annulare: Secondary | ICD-10-CM

## 2022-03-25 DIAGNOSIS — G8929 Other chronic pain: Secondary | ICD-10-CM

## 2022-03-25 NOTE — Patient Instructions (Signed)
Standing Labs We placed an order today for your standing lab work.   Please have your standing labs drawn in July and every 3 months  If possible, please have your labs drawn 2 weeks prior to your appointment so that the provider can discuss your results at your appointment.  Please note that you may see your imaging and lab results in [place] before we have reviewed them. We may be awaiting multiple results to interpret others before contacting you. Please allow our office up to 72 hours to thoroughly review all of the results before contacting the office for clarification of your results.  We have open lab daily: Monday through Thursday from 1:30-4:30 PM and Friday from 1:30-4:00 PM at the office of Dr. Bo Merino, [facility].   Please be advised, all patients with office appointments requiring lab work will take precedent over walk-in lab work.  If possible, please come for your lab work on Monday and Friday afternoons, as you may experience shorter wait times. The office is located at [address], [place], [place], [place] [address] No appointment is necessary.   Labs are drawn by [facility]. Please bring your co-pay at the time of your lab draw.  You may receive a bill from [facility] for your lab work.  Please note if you are on Hydroxychloroquine and and an order has been placed for a Hydroxychloroquine level, you will need to have it drawn 4 hours or more after your last dose.  If you wish to have your labs drawn at another location, please call the office 24 hours in advance to send orders.  If you have any questions regarding directions or hours of operation,  please call [phone].   As a reminder, please drink plenty of water prior to coming for your lab work. Thanks!   Vaccines You are taking a medication(s) that can suppress your immune system.  The following immunizations are recommended: Flu annually Covid-19  Td/Tdap (tetanus, diphtheria, pertussis)  every 10 years Pneumonia (Prevnar 15 then Pneumovax 23 at least 1 year apart.  Alternatively, can take Prevnar 20 without needing additional dose) Shingrix: 2 doses from 4 weeks to 6 months apart  Please check with your PCP to make sure you are up to date.   If you have signs or symptoms of an infection or start antibiotics: First, call your PCP for workup of your infection. Hold your medication through the infection, until you complete your antibiotics, and until symptoms resolve if you take the following: Injectable medication (Actemra, Benlysta, Cimzia, Cosentyx, Enbrel, Humira, Kevzara, Orencia, Remicade, Simponi, Stelara, Taltz, Tremfya) Methotrexate Leflunomide (Arava) Mycophenolate (Cellcept) Morrie Sheldon, Olumiant, or Rinvoq

## 2022-03-30 ENCOUNTER — Other Ambulatory Visit (INDEPENDENT_AMBULATORY_CARE_PROVIDER_SITE_OTHER): Payer: Medicare HMO

## 2022-03-30 DIAGNOSIS — R600 Localized edema: Secondary | ICD-10-CM | POA: Diagnosis not present

## 2022-03-30 LAB — BASIC METABOLIC PANEL
BUN: 33 mg/dL — ABNORMAL HIGH (ref 6–23)
CO2: 23 mEq/L (ref 19–32)
Calcium: 9.4 mg/dL (ref 8.4–10.5)
Chloride: 102 mEq/L (ref 96–112)
Creatinine, Ser: 1.91 mg/dL — ABNORMAL HIGH (ref 0.40–1.50)
GFR: 33.23 mL/min — ABNORMAL LOW (ref >60.00)
Glucose, Bld: 120 mg/dL — ABNORMAL HIGH (ref 70–99)
Potassium: 4.4 mEq/L (ref 3.5–5.1)
Sodium: 137 mEq/L (ref 135–145)

## 2022-04-02 DIAGNOSIS — M25511 Pain in right shoulder: Secondary | ICD-10-CM | POA: Diagnosis not present

## 2022-04-05 ENCOUNTER — Ambulatory Visit (INDEPENDENT_AMBULATORY_CARE_PROVIDER_SITE_OTHER): Payer: Medicare HMO | Admitting: Family Medicine

## 2022-04-05 ENCOUNTER — Encounter: Payer: Self-pay | Admitting: Family Medicine

## 2022-04-05 VITALS — BP 122/76 | HR 95 | Temp 98.1°F | Ht 71.0 in | Wt 234.2 lb

## 2022-04-05 DIAGNOSIS — G8929 Other chronic pain: Secondary | ICD-10-CM | POA: Diagnosis not present

## 2022-04-05 DIAGNOSIS — K121 Other forms of stomatitis: Secondary | ICD-10-CM

## 2022-04-05 DIAGNOSIS — R600 Localized edema: Secondary | ICD-10-CM

## 2022-04-05 MED ORDER — TRIAMCINOLONE ACETONIDE 0.1 % MT PSTE: 1.0000 | PASTE | Freq: Two times a day (BID) | OROMUCOSAL | 0 refills | Status: DC

## 2022-04-05 MED ORDER — HYDROCODONE-ACETAMINOPHEN 10-325 MG PO TABS: 0.5000 | ORAL_TABLET | Freq: Two times a day (BID) | ORAL | 0 refills | Status: DC | PRN

## 2022-04-05 NOTE — Progress Notes (Signed)
Chief Complaint  Patient presents with   follow-up    Refill hydrocodone    Subjective: Patient is a 76 y.o. male here for f/u.  He is here with his wife.  Patient has a history of bilateral lower extremity swelling.  His Lasix was recently increased because it was taking 2 hours for him to urinate after his first dosage.  He is compliant with Lasix 80 mg daily in addition to over-the-counter potassium.  He reports improvement in his swelling and also faster time to urinate after taking his first dose.  Patient has a history of chronic low back pain after frequent hospitalizations and falls.  He was seeing a specialist who is prescribing him hydrocodone 5 mg daily as needed.  This was helping with his functionality and pain but the specialist was requiring frequent office visits that were quite expensive to the patient and his family.  The specialist recommended following up with me for continued management of this.  He needs his first refill today.  Oral lesion Around a month ago, the patient bit the left side of his mouth.  It has been painful and irritated since then.  He had a dental extraction 3 weeks ago and he no longer bites this area but the lesion is still persistent.  His dentist sent in a topical steroid but was not meant to have mucosal use so the patient did not fill it.  There is nothing draining from this area and he denies fevers.  Past Medical History:  Diagnosis Date   Anemia    Essential hypertension 07/18/2017   Foot drop, left foot    due to back surgery in 01/2021   GERD (gastroesophageal reflux disease)    History of colonic polyps    History of hiatal hernia    many years ago   History of rheumatic fever    History of stroke 07/18/2017   Hypertriglyceridemia 07/18/2017   S/P TAVR (transcatheter aortic valve replacement) 01/26/2022   [name] 7m via TF approach with Dr. [name] Dr. BCyndia Bent  Severe aortic stenosis    Severe aortic stenosis    Type 2  diabetes mellitus with diabetic neuropathy, unspecified ([place] 07/18/2017    Objective: BP 122/76   Pulse 95   Temp 98.1 F (36.7 C) (Oral)   Ht '5\' 11"'$  (1.803 m)   Wt 234 lb 4 oz (106.3 kg)   SpO2 97%   BMI 32.67 kg/m  General: Awake, appears stated age Mouth: MMM, ulceration noted over the posterior left buccal mucosa without active drainage or significant erythema Heart: RRR, 2+ pitting bilateral LE edema tapering at the mid tibia  lungs: CTAB, no rales, wheezes or rhonchi. No accessory muscle use Psych: Age appropriate judgment and insight, normal affect and mood  Assessment and Plan: Bilateral lower extremity edema  Other chronic pain  Oral ulcer - Plan: triamcinolone (KENALOG) 0.1 % paste  Chronic, stable.  Continue to elevate legs and use compression stockings.  We will continue Lasix 80 mg daily with over-the-counter potassium supplementation. Chronic, stable.  Continue hydrocodone 5 mg every 12 hours as needed.  Normally he takes it once a day. Trial Kenalog paste, if no improvement will consider ENT for possible biopsy. Unless he needs me, I will see him in 6 months. The patient and his spouse voiced understanding and agreement to the plan.  NMontgomery DO 04/05/22  4:42 PM

## 2022-04-05 NOTE — Patient Instructions (Signed)
Let me know if the mouth lesion does not improve in the next 2 weeks or so. The dental paste should be affordable.   Stay on the water pill for now.   Continue to elevate your legs and stay active.  Let us know if you need anything.

## 2022-04-08 ENCOUNTER — Ambulatory Visit (INDEPENDENT_AMBULATORY_CARE_PROVIDER_SITE_OTHER): Payer: Medicare HMO | Admitting: Pharmacist

## 2022-04-08 DIAGNOSIS — I10 Essential (primary) hypertension: Secondary | ICD-10-CM

## 2022-04-08 DIAGNOSIS — R600 Localized edema: Secondary | ICD-10-CM

## 2022-04-08 DIAGNOSIS — E1169 Type 2 diabetes mellitus with other specified complication: Secondary | ICD-10-CM

## 2022-04-08 DIAGNOSIS — Z794 Long term (current) use of insulin: Secondary | ICD-10-CM

## 2022-04-08 NOTE — Patient Instructions (Signed)
Tommy Green It was a pleasure speaking with you  Below is a summary of your health goals and care plan   Patient Goals/Self-Care Activities take medications as prescribed,  check glucose 3 to 4 times a day prior to meals and at bedtime or as needed, document, and provide at future appointments,  check blood pressure 2 to 3 times per week, document, and provide at future appointments, and  How to Treat a Low Glucose Level:  If you have a low blood glucose less than 70, please eat / drink 15 grams of carbohydrates (4 oz of juice, soda (not diet), 4 glucose tablets, or 3-4 pieces of hard candy).  It is best so choose a "quick" source of sugar that does no contain fat (chocolate and peanut butter might take longer to increase your blood glucose) Wait 15 minutes and then recheck your blood glucose. If your blood glucose is still less than 70, eat another 15 grams of carbohydrates.  Wait another 15 minutes and recheck your glucose.  Continue this until your blood glucose is over 70. Once you blood glucose is over 70, eat a snack with protein in it to prevent your blood glucose from dropping again. limit use of nasal spray decongestant to prevent rebound congestion - do not use more than 3 day in a row.    If you have any questions or concerns, please feel free to contact me either at the phone number below or with a MyChart message.   Keep up the good work!  [name], PharmD Clinical Pharmacist [facility] [phone] (direct line)  [phone] (main office number)   Patient verbalizes understanding of instructions and care plan provided today and agrees to view in [facility]. Active MyChart status and patient understanding of how to access instructions and care plan via MyChart confirmed with patient.

## 2022-04-08 NOTE — Chronic Care Management (AMB) (Signed)
Chronic Care Management Pharmacy Note  04/08/2022 Name:  Tommy Green MRN:  [id number] DOB:  08-26-1946  Summary: Aortic Stenosis: Patient had TAVR 01/07/2022. It was recommended hat he should start aspirin 21m daily but patient has not started yet due to history of GI issues with aspirin. Cardiology is aware. DM, insulin dependent: Denies hypoglycemia in the last month. Highest blood glucose was 178. Patient saw Dr DMeredith Pel05/2023. He also had eye exam in January 2023 - [facility] ([phone]- had to leave message for OV notes on VM) Hypertension: Last office blood pressure was at goal of < 130/80 (CKD).  Recent home blood pressure has been at goal, ranging form 110 to 135 / 70's. Patient endorses adherence to blood pressure medications. Reviewed refill history and had filled lisinopril and amlodipine on time for 2023. Hyperlipidemia: Patient restarted ezetimibe and is tolerating well. Due to have lipids rechecked with next labs.   Plan: follow up 8 to 12 weeks to review medication adherence, blood pressure and blood glucose.   Subjective: DSamit SylvePlaster is an 76y.o. year old male who is a primary patient of [name] DO.  The CCM team was consulted for assistance with disease management and care coordination needs.    Engaged with patient by telephone for follow up visit in response to provider referral for pharmacy case management and/or care coordination services.   Consent to Services:  The patient was given information about Chronic Care Management services, agreed to services, and gave verbal consent prior to initiation of services.  Please see initial visit note for detailed documentation.   Patient Care Team: [name] DO as PCP - General (Family Medicine) [name] MD as PCP - Cardiology (Cardiology) [name] MD as Referring Physician (Internal Medicine) [name] OD  (Optometry) [name] MD (Orthopedic Surgery) [name] RPH-CPP (Pharmacist)  Recent office visits: 04/05/2022 - Fam Med (Dr WNani Ravens Seen for lower extremity edema and chronic back pain. Prescribed trimacinolono paste for mouth ulcer Hydrocodone/APAP for pain 10/3268m - take 0.5 tablet twice a day as needed. 03/22/2022 - Fam Med (Dr WeNani RavensSeen for LLE. Increased furosemide to 8030maily. F/U 2 weeks.  03/05/2022 - Fam Med (Dr WenNani Ravenseen for impacted cerumen of right ear and LEE. Started furosemide 49m19mily. Ear irrigation. 12/28/2021 - Fam Med (Dr WendNani Ravensdeo Visit. Left ear pain. Prescribed prednisone 49mg24make 2 tablets = 80mg 10m5 days.  12/21/2021 - Fam Med (Dr WendliNani Ravens for hospital follow up / pre syncope. Recommended suspending physical therapy for awhile. Check CBC, CMP, magnesium and phosphorus.  11/27/2021 - Fam /Med (Dr WendliNani Ravens for sore toe. BP was 141/86. Recommended triple antibiotic ointment twice a day and cover with band-aid. Recommended mesh / better fitting shoes. 11/09/21-Tommy Paul WNolene EbbsPCP, Video Visit) Seen for hypertension. [facility], Start HCTZ..   ReMarland Green consult visits:  03/25/2022 - Rheumatology (Dr DeveshEstanislado Green for RA and OA. Continued Enbrel and leflunomide. Recommended annual skin exam. F/U 5 months 03/03/2022 [facility] Continued ASA 81mg d79m. F/U 1 year 03/01/2022 - Ortho Surgery (Dr BegovicArrie EasternLeg weakness and numbness. Referred for electrodiagnositic testing. Testing noted evidence suggestive of senorimotor peripheral polyneuropathy in left LE. Recommended diabetes control, control of left LEE.  02/03/2022 - Cardio (McDaniGlenford Peers/U TAVR and aortic valve stenosis. Prescribed amoxicillin to be  used prior to dental procedures. Labs ordered. EKG and MRI of abdomen also ordered.  01/07/2022 - Cartiothoracic surgery (Dr [name]) Surgical  consult for aortic valve replacement. Patient scheduled for TAVR.  11/17/21-(Rheumatology) Bo Merino, MD. Medication management visit. Labs ordered. Follow up in 5 months.  10/29/21-(Podiatry) Tommy Green, DPM. Seen for diabetic foot exam. 10/20/21-(Cardiology) Tommy Priest, MD. Echo cardiogram ordered. Follow up in 6 months.   Hospital visits: 01/26/2022 [facility] admission [facility] at [facility]. Medications started at discharge: Aspirin 11m daily   12/09/2021 to 12/12/2021 [facility] with plans to continue evaluation for TAVR. Medications Stopped / Removed from Med List: HCTZ 263mMedications Started at Discharge: NONE Medication Changes at Discharge: NONE  Objective:  Lab Results  Component Value Date   CREATININE 1.91 (H) 03/30/2022   CREATININE 1.68 (H) 03/12/2022   CREATININE 1.41 (H) 02/03/2022    Lab Results  Component Value Date   HGBA1C 5.7 (H) 01/22/2022   Last diabetic Eye exam: No results found for: "HMDIABEYEEXA"  Last diabetic Foot exam: No results found for: "HMDIABFOOTEX"      Component Value Date/Time   CHOL 187 05/04/2021 0852   TRIG 315.0 (H) 05/04/2021 0852   HDL 28.10 (L) 05/04/2021 0852   CHOLHDL 7 05/04/2021 0852   VLDL 63.0 (H) 05/04/2021 0852   LDLCALC 79 03/06/2021 0413   LDLDIRECT 107.0 05/04/2021 0852       Latest Ref Rng & Units 01/22/2022   11:44 AM 12/21/2021   11:46 AM 12/12/2021    3:42 AM  Hepatic Function  Total Protein 6.5 - 8.1 g/dL 6.3  6.4  6.0   Albumin 3.5 - 5.0 g/dL 3.5  4.0  3.2   AST 15 - 41 U/L 21  15  21    ALT 0 - 44 U/L 17  11  14    Alk Phosphatase 38 - 126 U/L 36  34  27   Total Bilirubin 0.3 - 1.2 mg/dL 0.4  0.3  0.4     No results found for: "TSH", "FREET4"     Latest Ref Rng & Units 02/03/2022   10:25 AM 01/27/2022    1:20 AM 01/26/2022   10:05 AM  CBC  WBC 3.4 - 10.8 x10E3/uL 8.4  8.0    Hemoglobin 13.0 - 17.7 g/dL  8.0  7.9  7.5   Hematocrit 37.5 - 51.0 % 25.2  25.0  22.0   Platelets 150 - 450 x10E3/uL 296  252      Lab Results  Component Value Date/Time   VD25OH 26.88 (L) 10/24/2017 10:41 AM    Clinical ASCVD: Yes  The ASCVD Risk score (Arnett DK, et al., 2019) failed to calculate for the following reasons:   The patient has a prior MI or stroke diagnosis     Social History   Tobacco Use  Smoking Status Former   Packs/day: 1.00   Years: 10.00   Total pack years: 10.00   Types: Cigarettes   Quit date: 1973   Years since quitting: 50.5  Smokeless Tobacco Never   BP Readings from Last 3 Encounters:  04/05/22 122/76  03/25/22 (!) 142/77  03/22/22 130/74   Pulse Readings from Last 3 Encounters:  04/05/22 95  03/25/22 71  03/22/22 94   Wt Readings from Last 3 Encounters:  04/05/22 234 lb 4 oz (106.3 kg)  03/25/22 229 lb 6.4 oz (104.1 kg)  03/22/22 233 lb  12.8 oz (106.1 kg)    Assessment: Review of patient past medical history, allergies, medications, health status, including review of consultants reports, laboratory and other test data, was performed as part of comprehensive evaluation and provision of chronic care management services.   SDOH:  (Social Determinants of Health) assessments and interventions performed:     CCM Care Plan  Allergies  Allergen Reactions   Fluzone Quadrivalent [Influenza Vac Split Quad] Other (See Comments)    "Got the flu"   Influenza Vaccines Other (See Comments)    "Got the flu"   Atorvastatin Other (See Comments)    Myalgias, GI upset, and "allergic," per [facility]   Statins Other (See Comments)    Muscle pain- "allergic," per MAR   Aspirin Other (See Comments)    Gastroesophageal reflux and "allergic," per MAR    Canagliflozin Other (See Comments)    Pt reports that the use of Invokana caused acute KIDNEY FAILURE ([facility] records) and "allergic," per [facility]    Medications Reviewed Today     Reviewed by [name], RPH-CPP (Pharmacist) on  04/08/22 at [place] List Status: <None>   Medication Order Taking? Sig Documenting Provider Last Dose Status Informant  acetaminophen (TYLENOL) 325 MG tablet [id number] Yes Take 650 mg by mouth every 6 (six) hours as needed for mild pain or headache. [provider] Taking Active Self  amLODipine (NORVASC) 5 MG tablet [id number] Yes Take 1 tablet (5 mg total) by mouth daily. [name], DO Taking Active Self  amoxicillin (AMOXIL) 500 MG tablet [id number] Yes Take 4 tablets (2,000 mg total) by mouth as directed. 1 HOUR PRIOR TO DENTAL APPOINTMENTS Tommie Raymond, NP Taking Active   aspirin 81 MG chewable tablet [id number] No Chew 1 tablet (81 mg total) by mouth daily.  Patient not taking: Reported on 04/08/2022   Tommie Raymond, NP Not Taking Active   B Complex Vitamins (B COMPLEX PO) [id number] Yes Take 1 tablet by mouth daily. [provider] Taking Active Self  Cholecalciferol (VITAMIN D3 SUPER STRENGTH) 50 MCG (2000 UT) CAPS [id number] Yes Take 2,000 Units by mouth daily. [provider] Taking Active Self  Etanercept (ENBREL MINI) 50 MG/ML SOCT [id number] Yes Inject 50 mg into the skin once a week. [name], PA-C Taking Active   ezetimibe (ZETIA) 10 MG tablet [id number] Yes Take 1 tablet (10 mg total) by mouth daily. For elevated cholesterol and triglycerides [name], DO Taking Active Self  fenofibrate micronized (LOFIBRA) 200 MG capsule [id number] Yes TAKE 1 CAPSULE DAILY BEFOREBREAKFAST [name], DO Taking Active Self  furosemide (LASIX) 80 MG tablet [id number] Yes Take 1 tablet (80 mg total) by mouth daily. [name], DO Taking Active   HYDROcodone-acetaminophen [facility]) 10-325 MG tablet [id number] Yes Take 0.5 tablets by mouth 2 (two) times daily as needed for moderate pain. [name], DO Taking Active   HYDROcodone-acetaminophen [facility]) 10-325 MG tablet [id number] Yes Take 0.5 tablets by mouth  every 12 (twelve) hours as needed for moderate pain. [name], DO Taking Active   HYDROcodone-acetaminophen [facility]) 10-325 MG tablet [id number] Yes Take 0.5 tablets by mouth every 12 (twelve) hours as needed for moderate pain. [name], DO Taking Active   insulin aspart (NOVOLOG FLEXPEN) 100 UNIT/ML FlexPen [id number] Yes Inject 0-10 Units into the skin See admin instructions. Inject 0-10 units into the skin before meals, PER SLIDING SCALE: BGL 0-200 = give nothing, 201-250 = 2 units, 251-300 = 4  units, 301-350 = 6 units, 351-400 = 8 units, >400 = 10 units, push fluids, and repeat BGL check in 2 hours. If BGL remains >400 after 2 hours, CALL MD. [provider] Taking Active Self  insulin degludec (TRESIBA) 100 UNIT/ML FlexTouch Pen [id number] Yes Inject 30 Units into the skin at bedtime. Donne Hazel, MD Taking Active Self  leflunomide (ARAVA) 10 MG tablet [id number] Yes TAKE 1 TABLET BY MOUTH EVERY DAY [name], PA-C Taking Active Self  lisinopril (ZESTRIL) 40 MG tablet [id number] Yes Take 1 tablet (40 mg total) by mouth in the morning. [name], DO Taking Active Self  magnesium oxide (MAG-OX) 400 MG tablet [id number] Yes Take 400 mg by mouth daily with lunch. [provider] Taking Active Self  melatonin 5 MG TABS [id number] Yes Take 5 mg by mouth at bedtime. [provider] Taking Active Self  Multiple Vitamins-Minerals (MULTIVITAMIN WITH MINERALS) tablet [id number] Yes Take 1 tablet by mouth daily with breakfast. [provider] Taking Active Self  Omega 3 1000 MG CAPS [id number] Yes Take 1,000 mg by mouth daily with lunch. [provider] Taking Active Self  omeprazole (PRILOSEC) 20 MG capsule [id number] Yes Take 1 capsule (20 mg total) by mouth daily before breakfast. [name], DO Taking Active Self  [facility] VERIO test strip [id number] Yes 4 (four) times daily. [provider] Taking  Active Self  Phenylephrine HCl (SINEX REGULAR NA) [id number] Yes Place 1 spray into both nostrils daily as needed. [provider] Taking Active Self  Potassium 99 MG TABS [id number] Yes Take 99 mg by mouth every evening. [provider] Taking Active Self  triamcinolone (KENALOG) 0.1 % paste [id number] Yes Use as directed 1 Application in the mouth or throat 2 (two) times daily. [name], DO Taking Active   vitamin B-12 (CYANOCOBALAMIN) 1000 MCG tablet [id number] Yes Take 1,000 mcg by mouth daily. [provider] Taking Active Self  vitamin E 180 MG (400 UNITS) capsule [id number] Yes Take 400 Units by mouth daily. [provider] Taking Active Self            Patient Active Problem List   Diagnosis Date Noted   Other chronic pain 03/05/2022   Bilateral lower extremity edema 03/05/2022   Severe aortic stenosis 01/26/2022   S/P TAVR (transcatheter aortic valve replacement) 01/26/2022   Microcytic anemia 12/10/2021   Obesity (BMI 30-39.9) 12/10/2021   Near syncope 12/09/2021   Aortic stenosis 12/09/2021   Rheumatoid arthritis ([place]) 12/09/2021   Foot drop, left foot 10/02/2021   History of colonic polyps 10/02/2021   History of rheumatic fever 10/02/2021   Stress fracture of left foot 09/03/2021   Gabapentin overdose, accidental or unintentional, initial encounter 03/08/2021   TIA (transient ischemic attack) 03/05/2021   Body mass index (BMI) 36.0-36.9, adult 10/03/2020   Subdural hemorrhage following injury without open intracranial wound and with prolonged loss of consciousness (more than 24 hours) without return to pre-existing conscious level ([place]) 10/03/2020   AKI (acute kidney injury) ([place])    Acute encephalopathy    SDH (subdural hematoma) ([place]) 56/97/9480   Cyclic vomiting syndrome 08/06/2019   Neuropathy 12/25/2018   AK (actinic keratosis) 08/24/2018   Neoplasm of uncertain behavior 16/55/3748   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    Tendinopathy of right gluteus medius 07/13/2018   Tendinopathy of left gluteus medius 07/13/2018   Squamous cell carcinoma in situ (SCCIS) of skin 03/24/2018   Vitamin D deficiency 11/29/2017   Stage  3 chronic kidney disease ([place]) 10/28/2017   Primary osteoarthritis of right hip 09/11/2017   Essential hypertension 07/18/2017   Hyperlipidemia associated with type 2 diabetes mellitus ([place]) 07/18/2017   Type 2 diabetes mellitus with diabetic neuropathy, unspecified ([place]) 07/18/2017   Heart murmur 07/18/2017   Hypertriglyceridemia 07/18/2017   History of stroke 07/18/2017   DDD (degenerative disc disease), lumbar 06/15/2017   Iron deficiency anemia 06/15/2017   Stroke ([place]) 06/15/2017   Encounter for hepatitis C screening test for low risk patient 06/30/2016   Anemia 06/19/2016   Microalbuminuria due to type 2 diabetes mellitus ([place]) 03/19/2016   Hypertrophy of inferior nasal turbinate 12/20/2015   Disease of nasal cavity and sinuses 12/10/2015   Degenerative tear of acetabular labrum of left hip 11/04/2015   History of tobacco abuse 05/15/2015   Morbid (severe) obesity due to excess calories ([place]) 04/11/2015   Disorder of both eustachian tubes 01/27/2015   Maxillary sinusitis 01/13/2015   Lipoprotein deficiency 01/31/2014   Gastro-esophageal reflux disease without esophagitis 11/02/2013   Rheumatic fever 11/02/2013   Erectile dysfunction associated with type 2 diabetes mellitus ([place]) 07/31/2013   Type 2 diabetes mellitus with hyperglycemia, without long-term current use of insulin ([place]) 11/16/2012    Immunization History  Administered Date(s) Administered   Fluad Quad(high Dose 65+) 08/31/2021   Influenza-Unspecified 07/31/2013, 04/17/2014   Moderna Sars-Covid-2 Vaccination 12/11/2019, 01/10/2020, 08/08/2020   Pneumococcal Conjugate-13 08/07/2015   Pneumococcal Polysaccharide-23 04/17/2013, 07/18/2013   Td 12/25/2018   Zoster, Live 04/17/2013    Conditions to be  addressed/monitored: CAD, HTN, HLD, DMII, and Green mouth / dental issues; neuropathy; TAVR; aortic stenosis, severe; CKD - 3  Care Plan : General Pharmacy (Adult)  Updates made by [name], RPH-CPP since 04/08/2022 12:00 AM     Problem: Chronic conditions: HTN; Rheumatoid arthritis; Green mouth; chronic back pain; HDL with hypertriglyceridemia; Type 2 DM; CKD;Nonrheumatic aortic valve stenosis; anemia      Long-Range Goal: Provide education, support and care coordination for medication therapy and chronic conditions   Start Date: 11/25/2021  Priority: High  Note:   Current Barriers:  Unable to achieve control of hyperlipidemia, mixed with elevated LDL and triglycerides  Does not adhere to prescribed medication regimen Experiencing Green mouth with effects on dental health - concerned it could be medication related  Pharmacist Clinical Goal(s):  Over the next 90 days, patient will achieve adherence to monitoring guidelines and medication adherence to achieve therapeutic efficacy achieve control of mixed hyperlipidemia  as evidenced by LDL < 70 maintain control of hypertension and type 2 DM as evidenced by blood pressure < 130/80 and A1c < 7.0  Identify medication causes of Green mouth and is possible identify alternative therapies  through collaboration with PharmD and provider.   Interventions: 1:1 collaboration with [name], DO regarding development and update of comprehensive plan of care as evidenced by provider attestation and co-signature Inter-disciplinary care team collaboration (see longitudinal plan of care) Comprehensive medication review performed; medication list updated in electronic medical record   Diabetes / CKD - 3a: Diabetes controlled per last A1c. A1c goal < 7.0%  Lab Results  Component Value Date   CREATININE 1.91 (H) 03/30/2022   CREATININE 1.68 (H) 03/12/2022   CREATININE 1.41 (H) 02/03/2022  Endocrinologist is Dr Meredith Pel. Patient reports he saw  Dr Meredith Pel 02/2022 Current treatment: Tyler Aas 30 units daily at bedtime Novolog insulin inject 0 to 10 units per sliding scale prior to each meal (patient reports he usually only needs  once per day) Current glucose readings: 105 to 180; No reading in last month < 80 or > 200 per patient.  Denies hypoglycemia in the last month Current exercise: none currently Interventions:  Might consider adding SGLT Discussed Continuous Glucose Monitor - patient not interested at this time, will continue to check blood glucose 3 to 4 times daily with fingerstick glucometer.  How to Treat a Low Glucose Level:  If you have a low blood glucose less than 70, please eat / drink 15 grams of carbohydrates (4 oz of juice, soda (not diet), 4 glucose tablets, or 3-4 pieces of hard candy).  It is best so choose a "quick" source of sugar that does no contain fat (chocolate and peanut butter might take longer to increase your blood glucose) Wait 15 minutes and then recheck your blood glucose. If your blood glucose is still less than 70, eat another 15 grams of carbohydrates.  Wait another 15 minutes and recheck your glucose.  Continue this until your blood glucose is over 70. Once you blood glucose is over 70, eat a snack with protein in it to prevent your blood glucose from dropping again. Reviewed home blood glucose readings and reviewed goals  Fasting blood glucose goal (before meals) = 80 to 130 Blood glucose goal after a meal = less than 180   Hypertension: Controlled; blood pressure goal < 130/80 Current treatment: Amlodipine 6m daily  Lisinopril 45mdaily  Previous therapy tried: carvedilol - stopped due to suspected Green mouth Current home readings:  SBP 118 - 135 DBP 70 to 80 Hospitalization for syncope in early 2023. Patient had TAVR 12/2021 and tolerated procedure well. Reports improved energy Interventions:  Continue to take lisinopril and amlodipine Continue to check blood pressure daily and  record  Hyperlipidemia, mixed: Uncontrolled; LDL goal < 70 and Tg goal < 150 current treatment: Fenofibrate 2002maily with breakfast.  Ezetimbe 78m41mily Fish oil 1000mg23mly Medications previously tried: atorvastatin - myalgias; Has also taken pravastatin and lovastatin in past; notation that patient is intolerant to all statins Patient has restarted ezetimibe and is tolerating well.  Interventions:  Continue ezetimibe 78mg 21my, fenofibrate and fish oil. Due to have lipids rechecked.   Medication management Pharmacist Clinical Goal(s): Over the next 90 days, patient will work with PharmD and providers to maintain optimal medication adherence Current [facility] Comprehensive medication review performed. Reviewed refill history and assessed adherence Continue current medication management strategy Patient instructed to start aspirin 81mg d40m after TAVR but did not start due to side effects - he states causes GI problems. Also noted to have low HGB. Cardiology office aware.   Reminded patient to limit use of nasal spray decongestant - do not use more than 3 day in a row.   Patient Goals/Self-Care Activities Over the next 90 days, patient will:  take medications as prescribed,  check glucose 3 to 4 times a day prior to meals and at bedtime or as needed, document, and provide at future appointments,  check blood pressure 2 to 3 times per week, document, and provide at future appointments, and  How to Treat a Low Glucose Level:  If you have a low blood glucose less than 70, please eat / drink 15 grams of carbohydrates (4 oz of juice, soda (not diet), 4 glucose tablets, or 3-4 pieces of hard candy).  It is best so choose a "quick" source of sugar that does no contain fat (chocolate and peanut butter might take longer  to increase your blood glucose) Wait 15 minutes and then recheck your blood glucose. If your blood glucose is still less than 70, eat another  15 grams of carbohydrates.  Wait another 15 minutes and recheck your glucose.  Continue this until your blood glucose is over 70. Once you blood glucose is over 70, eat a snack with protein in it to prevent your blood glucose from dropping again. limit use of nasal spray decongestant to prevent rebound congestion - do not use more than 3 day in a row.   Follow Up Plan: Telephone follow up appointment with care management team member scheduled for:  8 to 12 weeks; will continue to follow adherence for lisinopril since adherence for 2022 was < 80%         Medication Assistance: None required.  Patient affirms current coverage meets needs.  Patient's preferred pharmacy is:  [facility] [place] [address] [facility] [address] [facility] [address] [place] [address] Phone: [phone] Fax: [phone]  [id number] [address] [address]Phone: [phone]: [address] [place]Phone: [phone]: [phone]  Follow Up:  Patient agrees to Care Plan and Follow-up.  Plan: Telephone follow up appointment with care management team member scheduled for:  6 to 8 weeks to recheck adherence  TCherre Robins PharmD Clinical Pharmacist [facility]Phys[facility]

## 2022-04-12 ENCOUNTER — Other Ambulatory Visit: Payer: Self-pay | Admitting: Rheumatology

## 2022-04-12 DIAGNOSIS — M0579 Rheumatoid arthritis with rheumatoid factor of multiple sites without organ or systems involvement: Secondary | ICD-10-CM

## 2022-04-12 MED ORDER — LEFLUNOMIDE 10 MG PO TABS: 10.0000 mg | ORAL_TABLET | Freq: Every day | ORAL | 0 refills | Status: DC

## 2022-04-12 NOTE — Telephone Encounter (Signed)
Patient's wife Tommy Green called requesting prescription refill of [facility]lun[facility] [address]

## 2022-04-16 DIAGNOSIS — N1831 Chronic kidney disease, stage 3a: Secondary | ICD-10-CM | POA: Diagnosis not present

## 2022-04-16 DIAGNOSIS — E1122 Type 2 diabetes mellitus with diabetic chronic kidney disease: Secondary | ICD-10-CM

## 2022-04-16 DIAGNOSIS — E785 Hyperlipidemia, unspecified: Secondary | ICD-10-CM | POA: Diagnosis not present

## 2022-04-16 DIAGNOSIS — I129 Hypertensive chronic kidney disease with stage 1 through stage 4 chronic kidney disease, or unspecified chronic kidney disease: Secondary | ICD-10-CM | POA: Diagnosis not present

## 2022-04-16 DIAGNOSIS — Z794 Long term (current) use of insulin: Secondary | ICD-10-CM | POA: Diagnosis not present

## 2022-04-26 ENCOUNTER — Other Ambulatory Visit: Payer: Self-pay | Admitting: Rheumatology

## 2022-04-26 MED ORDER — ENBREL MINI 50 MG/ML ~~LOC~~ SOCT: 50.0000 mg | SUBCUTANEOUS | 0 refills | Status: DC

## 2022-04-26 NOTE — Telephone Encounter (Signed)
Patient's wife called the office requesting a refill of Enbrel. She requests 3 months if possible be sent to [facility].

## 2022-04-26 NOTE — Telephone Encounter (Signed)
Next Visit: 08/25/2022   Last Visit: 03/25/2022   Last Fill: 02/08/2022  DX: Rheumatoid arthritis involving multiple sites with positive rheumatoid factor    Current Dose per office note 03/25/2022:  Labs: 03/30/2022, BMP Glucose 120, BUN 33, Creatinine 1.91, GFR 33.23, 02/03/2022 CBC RBC 3.07, hemoglobin 8.0, Hematocrit 25.2, MCH 26.1,   TB Gold: 06/16/2021 Neg    Okay to refill Enbrel?

## 2022-04-30 ENCOUNTER — Ambulatory Visit (INDEPENDENT_AMBULATORY_CARE_PROVIDER_SITE_OTHER): Payer: Medicare HMO | Admitting: Family Medicine

## 2022-04-30 ENCOUNTER — Encounter: Payer: Self-pay | Admitting: Family Medicine

## 2022-04-30 VITALS — BP 132/70 | HR 98 | Temp 98.5°F | Ht 71.0 in | Wt 234.5 lb

## 2022-04-30 DIAGNOSIS — M67442 Ganglion, left hand: Secondary | ICD-10-CM

## 2022-04-30 DIAGNOSIS — M26629 Arthralgia of temporomandibular joint, unspecified side: Secondary | ICD-10-CM | POA: Diagnosis not present

## 2022-04-30 DIAGNOSIS — M67441 Ganglion, right hand: Secondary | ICD-10-CM

## 2022-04-30 DIAGNOSIS — L989 Disorder of the skin and subcutaneous tissue, unspecified: Secondary | ICD-10-CM | POA: Diagnosis not present

## 2022-04-30 MED ORDER — TRIAMCINOLONE ACETONIDE 0.1 % EX CREA: 1.0000 | TOPICAL_CREAM | Freq: Two times a day (BID) | CUTANEOUS | 0 refills | Status: DC

## 2022-04-30 NOTE — Patient Instructions (Signed)
Ice/cold pack over area for 10-15 min twice daily.  Heat (pad or rice pillow in microwave) over affected area, 10-15 minutes twice daily.   OK to take Tylenol 1000 mg (2 extra strength tabs) or 975 mg (3 regular strength tabs) every 6 hours as needed.  Avoid scented products on the leg.   Let us know if you need anything.

## 2022-04-30 NOTE — Progress Notes (Signed)
Chief Complaint  Patient presents with   Ear Pain    Spot on leg Cyst on hands painful    Tommy Green is a 76 y.o. male here for a skin complaint.  He is here with his wife.  Duration: 2 months Location: Right outer leg Pruritic? No Painful? No Drainage? No New soaps/lotions/topicals/detergents? No Sick contacts? No Other associated symptoms: Scaly, starting to scab, seems like it is expanding Therapies tried thus far: Neosporin  Hand lesions Duration: several years Location: Joints of hand Pruritic? No Painful? Yes; sometimes Drainage? No Other associated symptoms: It is not changing in size Therapies tried thus far: Ice, Tylenol  Ear pain The patient continues to have right ear pain, now going on for 2-1/2 months.  He was seen for this a couple months ago.  He has associated jaw pain and symptoms are worse after eating.  He denies any injury.  There is no drainage from his ears, fevers, or hearing loss.  Past Medical History:  Diagnosis Date   Anemia    Essential hypertension 07/18/2017   Foot drop, left foot    due to back surgery in 01/2021   GERD (gastroesophageal reflux disease)    History of colonic polyps    History of hiatal hernia    many years ago   History of rheumatic fever    History of stroke 07/18/2017   Hypertriglyceridemia 07/18/2017   S/P TAVR (transcatheter aortic valve replacement) 01/26/2022   [name] 33m via TF approach with Dr. [name] Dr. BCyndia Bent  Severe aortic stenosis    Severe aortic stenosis    Type 2 diabetes mellitus with diabetic neuropathy, unspecified ([place] 07/18/2017    BP 132/70   Pulse 98   Temp 98.5 F (36.9 C) (Oral)   Ht '5\' 11"'$  (1.803 m)   Wt 234 lb 8 oz (106.4 kg)   SpO2 93%   BMI 32.71 kg/m  Gen: awake, alert, appearing stated age Lungs: No accessory muscle use MSK: TTP over the right TMJ without edema or ecchymosis Ear: No TTP over the external ear, canals are patent without otorrhea or cerumen,  TM is negative bilaterally Skin: Scaly patch on the right lateral leg over the mid third.  There is associated excoriation without fluctuance, drainage, erythema, or induration. Over bilateral hands, there are various gelatinous and dome-shaped lesions with limited mobility.  They are not tender to palpation, erythematous, fluctuant, and there is no drainage. Psych: Age appropriate judgment and insight  Skin lesion - Plan: triamcinolone cream (KENALOG) 0.1 %  Ganglion cyst of both hands  TMJ syndrome  Chronic, not controlled.  Start triamcinolone 0.1% twice daily for 2 weeks.  If no improvement will biopsy. Offered aspiration which she came in for initially but does not want to affect and out and he asked tonight so he will come back in a couple weeks to have this done. Stretches and exercises, heat, ice, Tylenol, consider physical therapy if no improvement.  Avoid chewy foods and gum chewing. The patient and his spouse voiced understanding and agreement to the plan.  I spent 35 minutes with the patient discussing the above plans in addition to reviewing his chart on the same day of the visit.  [place] DO 04/30/22 10:58 AM

## 2022-05-02 DIAGNOSIS — M25511 Pain in right shoulder: Secondary | ICD-10-CM | POA: Diagnosis not present

## 2022-05-05 ENCOUNTER — Ambulatory Visit: Payer: Medicare HMO | Admitting: Family Medicine

## 2022-05-17 ENCOUNTER — Ambulatory Visit (INDEPENDENT_AMBULATORY_CARE_PROVIDER_SITE_OTHER): Payer: Medicare HMO | Admitting: Family Medicine

## 2022-05-17 ENCOUNTER — Encounter: Payer: Self-pay | Admitting: Family Medicine

## 2022-05-17 VITALS — BP 128/82 | HR 92 | Temp 98.1°F | Ht 71.0 in | Wt 234.5 lb

## 2022-05-17 DIAGNOSIS — M67442 Ganglion, left hand: Secondary | ICD-10-CM

## 2022-05-17 DIAGNOSIS — R2689 Other abnormalities of gait and mobility: Secondary | ICD-10-CM | POA: Diagnosis not present

## 2022-05-17 DIAGNOSIS — M67441 Ganglion, right hand: Secondary | ICD-10-CM | POA: Diagnosis not present

## 2022-05-17 DIAGNOSIS — R69 Illness, unspecified: Secondary | ICD-10-CM | POA: Diagnosis not present

## 2022-05-17 DIAGNOSIS — R5381 Other malaise: Secondary | ICD-10-CM

## 2022-05-17 DIAGNOSIS — F320 Major depressive disorder, single episode, mild: Secondary | ICD-10-CM | POA: Diagnosis not present

## 2022-05-17 MED ORDER — METHYLPREDNISOLONE ACETATE 40 MG/ML IJ SUSP
20.0000 mg | Freq: Once | INTRAMUSCULAR | Status: AC
  Administered 2022-05-17: 20 mg via INTRA_ARTICULAR

## 2022-05-17 MED ORDER — ESCITALOPRAM OXALATE 5 MG PO TABS: 5.0000 mg | ORAL_TABLET | Freq: Every day | ORAL | 2 refills | Status: DC

## 2022-05-17 NOTE — Progress Notes (Signed)
Chief Complaint  Patient presents with   drain cyst   Depression    Patient has been crying a lot    Subjective Stevenson Windmiller Hommes is an 76 y.o. male who presents with depression Symptoms began 2 mo ago. Anxiety symptoms: none. Depressive symptoms depressed mood, anhedonia, insomnia, hopelessness,. Family history significant for no psychiatric illness. Social stressors include: lack of mobility and no ability to drive. He is currently being treated with None and has failed None. He is not following with a psychologist.  Patient has been diagnosed with ganglion cyst of his hands.  He is here for drainage of some of them.  They are sore in areas and others are not bothering him.  They do not appear to be changing.  No drainage, fevers, or redness.  Past Medical History:  Diagnosis Date   Anemia    Essential hypertension 07/18/2017   Foot drop, left foot    due to back surgery in 01/2021   GERD (gastroesophageal reflux disease)    History of colonic polyps    History of hiatal hernia    many years ago   History of rheumatic fever    History of stroke 07/18/2017   Hypertriglyceridemia 07/18/2017   S/P TAVR (transcatheter aortic valve replacement) 01/26/2022   [name] 4m via TF approach with Dr. [name] Dr. BCyndia Bent  Severe aortic stenosis    Severe aortic stenosis    Type 2 diabetes mellitus with diabetic neuropathy, unspecified ([place] 07/18/2017     Family History Family History  Problem Relation Age of Onset   Hyperlipidemia Mother    Colon polyps Mother    Hyperlipidemia Father    Alcoholism Brother    Healthy Son    Healthy Son    Cancer Neg Hx    Colon cancer Neg Hx    Esophageal cancer Neg Hx    Rectal cancer Neg Hx    Stomach cancer Neg Hx     Exam BP 128/82   Pulse 92   Temp 98.1 F (36.7 C) (Oral)   Ht '5\' 11"'$  (1.803 m)   Wt 234 lb 8 oz (106.4 kg)   SpO2 97%   BMI 32.71 kg/m  General:  well developed, well nourished, in no apparent  distress Skin: Circular and semisolid masses over the medial IP joint on the right thumb and lateral PIP of the second digit. Lungs:  normal respiratory effort without accessory muscle use Psych: well oriented with normal range of affect and age-appropriate judgement/insight  Procedure note: Cyst aspiration/injection Verbal consent obtained 2 cysts were drained The areas of interest were cleaned with alcohol and topically anesthetized with freezing spray A 21-gauge needle was used to aspirate the inside of each cyst Around 0.1 cc of 40-1 Depo-Medrol mixed with 1% lidocaine without epinephrine was injected in each A Band-Aid was placed No immediate complications noted The patient tolerated the procedures well  Assessment and Plan  Current mild episode of major depressive disorder without prior episode ([place]  Ganglion cyst of both hands - Plan: methylPREDNISolone acetate (DEPO-MEDROL) injection 20 mg, PR ASPIRAT/INJECTION GANGLION CYST(S)  Balance problem - Plan: Ambulatory referral to Physical Therapy  Physical deconditioning - Plan: Ambulatory referral to Physical Therapy  Chronic, unstable.  Start Lexapro 5 mg daily.  Counseling information provided.  Follow-up in 1 month. Aspiration and injection today.  Aspiration did not yield much substance but will help injection did help.  Could consider hand therapy referral if no improvement. 3/4.  Refer to physical therapy. Patient voiced understanding and agreement to the plan.  [place], DO 05/17/22 12:02 PM

## 2022-05-17 NOTE — Patient Instructions (Addendum)
Please consider counseling. Contact [phone] to schedule an appointment or inquire about cost/insurance coverage.  [facility] located at [place], [place], [place].  Phone number = [phone].  Dr. Lennice Sites - Adult Psychiatry.    [facility] located at [place], [place], [place]. Phone number = [phone].   [facility] located at [address], [place], [place].  Phone number = [phone].   [facility] located at [place], [place], [place].  Phone number = [phone].  Aim to do some physical exertion for 150 minutes per week. This is typically divided into 5 days per week, 30 minutes per day. The activity should be enough to get your heart rate up. Anything is better than nothing if you have time constraints.  If you do not hear anything about your referral in the next 1-2 weeks, call our office and ask for an update.  Let us know if you need anything.

## 2022-05-18 ENCOUNTER — Telehealth: Payer: Self-pay | Admitting: Cardiology

## 2022-05-18 MED ORDER — AMOXICILLIN 500 MG PO TABS: 2000.0000 mg | ORAL_TABLET | ORAL | 6 refills | Status: DC

## 2022-05-18 NOTE — Telephone Encounter (Signed)
*  STAT* If patient is at the pharmacy, call can be transferred to refill team.   1. Which medications need to be refilled? (please list name of each medication and dose if known)   amoxicillin (AMOXIL) 500 MG tablet    2. Which pharmacy/location (including street and city if local pharmacy) is medication to be sent to? [id number] [place], [place] - [address] AT [address]OSS [address]  3. Do they need a 30 day or 90 day supply?  30 day

## 2022-05-19 DIAGNOSIS — Z9181 History of falling: Secondary | ICD-10-CM | POA: Diagnosis not present

## 2022-05-19 DIAGNOSIS — R2689 Other abnormalities of gait and mobility: Secondary | ICD-10-CM | POA: Diagnosis not present

## 2022-05-19 DIAGNOSIS — Z723 Lack of physical exercise: Secondary | ICD-10-CM | POA: Diagnosis not present

## 2022-05-19 DIAGNOSIS — M6281 Muscle weakness (generalized): Secondary | ICD-10-CM | POA: Diagnosis not present

## 2022-05-20 DIAGNOSIS — D509 Iron deficiency anemia, unspecified: Secondary | ICD-10-CM | POA: Diagnosis not present

## 2022-05-20 DIAGNOSIS — I10 Essential (primary) hypertension: Secondary | ICD-10-CM | POA: Diagnosis not present

## 2022-05-20 DIAGNOSIS — E782 Mixed hyperlipidemia: Secondary | ICD-10-CM | POA: Diagnosis not present

## 2022-05-20 DIAGNOSIS — I639 Cerebral infarction, unspecified: Secondary | ICD-10-CM | POA: Diagnosis not present

## 2022-05-20 DIAGNOSIS — E1140 Type 2 diabetes mellitus with diabetic neuropathy, unspecified: Secondary | ICD-10-CM | POA: Diagnosis not present

## 2022-05-20 DIAGNOSIS — N183 Chronic kidney disease, stage 3 unspecified: Secondary | ICD-10-CM | POA: Diagnosis not present

## 2022-05-20 DIAGNOSIS — E1165 Type 2 diabetes mellitus with hyperglycemia: Secondary | ICD-10-CM | POA: Diagnosis not present

## 2022-05-21 ENCOUNTER — Other Ambulatory Visit: Payer: Self-pay | Admitting: Family Medicine

## 2022-05-25 DIAGNOSIS — Z9181 History of falling: Secondary | ICD-10-CM | POA: Diagnosis not present

## 2022-05-25 DIAGNOSIS — R2689 Other abnormalities of gait and mobility: Secondary | ICD-10-CM | POA: Diagnosis not present

## 2022-05-25 DIAGNOSIS — Z723 Lack of physical exercise: Secondary | ICD-10-CM | POA: Diagnosis not present

## 2022-05-25 DIAGNOSIS — M6281 Muscle weakness (generalized): Secondary | ICD-10-CM | POA: Diagnosis not present

## 2022-05-27 DIAGNOSIS — Z9181 History of falling: Secondary | ICD-10-CM | POA: Diagnosis not present

## 2022-05-27 DIAGNOSIS — Z723 Lack of physical exercise: Secondary | ICD-10-CM | POA: Diagnosis not present

## 2022-05-27 DIAGNOSIS — M6281 Muscle weakness (generalized): Secondary | ICD-10-CM | POA: Diagnosis not present

## 2022-05-27 DIAGNOSIS — R2689 Other abnormalities of gait and mobility: Secondary | ICD-10-CM | POA: Diagnosis not present

## 2022-06-01 ENCOUNTER — Other Ambulatory Visit: Payer: Self-pay | Admitting: Family Medicine

## 2022-06-01 DIAGNOSIS — Z9181 History of falling: Secondary | ICD-10-CM | POA: Diagnosis not present

## 2022-06-01 DIAGNOSIS — R600 Localized edema: Secondary | ICD-10-CM

## 2022-06-01 DIAGNOSIS — Z723 Lack of physical exercise: Secondary | ICD-10-CM | POA: Diagnosis not present

## 2022-06-01 DIAGNOSIS — M6281 Muscle weakness (generalized): Secondary | ICD-10-CM | POA: Diagnosis not present

## 2022-06-01 DIAGNOSIS — R2689 Other abnormalities of gait and mobility: Secondary | ICD-10-CM | POA: Diagnosis not present

## 2022-06-03 DIAGNOSIS — Z9181 History of falling: Secondary | ICD-10-CM | POA: Diagnosis not present

## 2022-06-03 DIAGNOSIS — M6281 Muscle weakness (generalized): Secondary | ICD-10-CM | POA: Diagnosis not present

## 2022-06-03 DIAGNOSIS — Z723 Lack of physical exercise: Secondary | ICD-10-CM | POA: Diagnosis not present

## 2022-06-03 DIAGNOSIS — R2689 Other abnormalities of gait and mobility: Secondary | ICD-10-CM | POA: Diagnosis not present

## 2022-06-08 ENCOUNTER — Other Ambulatory Visit: Payer: Self-pay | Admitting: Family Medicine

## 2022-06-08 DIAGNOSIS — Z9181 History of falling: Secondary | ICD-10-CM | POA: Diagnosis not present

## 2022-06-08 DIAGNOSIS — M6281 Muscle weakness (generalized): Secondary | ICD-10-CM | POA: Diagnosis not present

## 2022-06-08 DIAGNOSIS — R2689 Other abnormalities of gait and mobility: Secondary | ICD-10-CM | POA: Diagnosis not present

## 2022-06-08 DIAGNOSIS — Z723 Lack of physical exercise: Secondary | ICD-10-CM | POA: Diagnosis not present

## 2022-06-10 DIAGNOSIS — M6281 Muscle weakness (generalized): Secondary | ICD-10-CM | POA: Diagnosis not present

## 2022-06-10 DIAGNOSIS — R2689 Other abnormalities of gait and mobility: Secondary | ICD-10-CM | POA: Diagnosis not present

## 2022-06-10 DIAGNOSIS — Z9181 History of falling: Secondary | ICD-10-CM | POA: Diagnosis not present

## 2022-06-10 DIAGNOSIS — Z723 Lack of physical exercise: Secondary | ICD-10-CM | POA: Diagnosis not present

## 2022-06-15 ENCOUNTER — Ambulatory Visit: Payer: Self-pay | Admitting: *Deleted

## 2022-06-15 ENCOUNTER — Encounter: Payer: Self-pay | Admitting: *Deleted

## 2022-06-15 DIAGNOSIS — M6281 Muscle weakness (generalized): Secondary | ICD-10-CM | POA: Diagnosis not present

## 2022-06-15 DIAGNOSIS — R2689 Other abnormalities of gait and mobility: Secondary | ICD-10-CM | POA: Diagnosis not present

## 2022-06-15 DIAGNOSIS — Z9181 History of falling: Secondary | ICD-10-CM | POA: Diagnosis not present

## 2022-06-15 DIAGNOSIS — Z723 Lack of physical exercise: Secondary | ICD-10-CM | POA: Diagnosis not present

## 2022-06-15 NOTE — Patient Outreach (Addendum)
Care Coordination   Initial Visit Note   06/15/2022 Name: Tommy Green MRN: [id number] DOB: 21-Feb-1946  Tommy Green is a 76 y.o. year old male who sees Nani Ravens, Crosby Oyster, DO for primary care. I spoke with spouse/ caregiver "pat" on [place] by phone today.  What matters to the patients health and wellness today?  Per souse/ caregiver: "Things are stable for now and we see Dr. Nani Ravens tomorrow, so we'll update him on everything; can't tell if the depression medicine is working, but it must be because his crying spells have stopped; he isn't sleeping very good at night, the melatonin doesn't make any difference at all, so I'm going to talk to Dr. Nani Ravens about that; I also want to ask if it is a good idea to see a hand surgeon about the cysts on his hand-- I think he might need to have surgery, but I want to make sure surgery will not impact his being able to use his walker, because he has made good progress with the physical therapy team-- we are able to walk up and down our driveway every day and he is doing much better with getting around.  I also plan to tell Dr. Nani Ravens that his blood sugars are much better than they have been in the past-- he had one low fasting blood sugar yesterday morning, it was 49, but he felt fine and it came right back up when he immediately drank orange juice.  We have done the Medicare Annual Wellness visit before, as close as we are to Dr. Nani Ravens, we don't really see the need to have that done right now, we have too many other things going on"  No further/ ongoing care coordination needs identified during today's outreach   Goals Addressed             This Visit's Progress    COMPLETED: Care Coordination Activities: No follow up required       Care Coordination Interventions: Evaluation of current treatment plan related to DMII/ ganglion cysts on hands, depression and patient's adherence to plan as established by  provider Advised patient to update PCP on sleep disturbances/ possible need for sleep aid medication Provided education to patient re: signs/ symptoms hypoglycemia- had isolated asymptomatic low fasting blood sugar yesterday of 49- spouse reports good understanding of signs/ symptoms hypoglycemia along with corresponding action plan; reports patient rarely has low blood sugars < 70 at home; reports blood sugars "under great control;" reviewed last A1-C result with spouse, 5.7 in April 2023; confirms patient adherent to taking medications and following prescribed diet Reviewed scheduled/upcoming provider appointments including 06/16/22- PCP follow up Advised patient to discuss ongoing sleep disturbance, management of bilateral ganglion cysts, and depression- reports she believes patient may need to see surgeon for ganglion cysts management; reports depression "somewhat better" on Lexapro- "he is not crying all the time like he was before" with provider Assessed social determinant of health barriers Confirmed patient attending outpatient PT twice a week and tolerating well; confirmed patient does not wish to complete Medicare Annual Wellness Visit; confirmed patient intends to obtain flu vaccine for upcoming 2023-24 flu season           SDOH assessments and interventions completed:  Yes  SDOH Interventions Today    Flowsheet Row Most Recent Value  SDOH Interventions   Food Insecurity Interventions Intervention Not Indicated  Transportation Interventions Intervention Not Indicated  [spouse provides transportation]  Care Coordination Interventions Activated:  Yes  Care Coordination Interventions:  Yes, provided   Follow up plan: No further intervention required.   Encounter Outcome:  Pt. Visit Completed   Oneta Rack, RN, BSN, CCRN Alumnus RN CM Care Coordination/ Transition of [place] Management [phone]: direct office

## 2022-06-16 ENCOUNTER — Ambulatory Visit (INDEPENDENT_AMBULATORY_CARE_PROVIDER_SITE_OTHER): Payer: Medicare HMO | Admitting: Family Medicine

## 2022-06-16 ENCOUNTER — Encounter: Payer: Self-pay | Admitting: Family Medicine

## 2022-06-16 VITALS — BP 132/78 | HR 96 | Temp 98.1°F | Ht 71.0 in | Wt 234.0 lb

## 2022-06-16 DIAGNOSIS — R5383 Other fatigue: Secondary | ICD-10-CM | POA: Diagnosis not present

## 2022-06-16 DIAGNOSIS — R0683 Snoring: Secondary | ICD-10-CM | POA: Diagnosis not present

## 2022-06-16 DIAGNOSIS — H9203 Otalgia, bilateral: Secondary | ICD-10-CM

## 2022-06-16 DIAGNOSIS — M79642 Pain in left hand: Secondary | ICD-10-CM

## 2022-06-16 DIAGNOSIS — M79641 Pain in right hand: Secondary | ICD-10-CM

## 2022-06-16 DIAGNOSIS — F320 Major depressive disorder, single episode, mild: Secondary | ICD-10-CM | POA: Diagnosis not present

## 2022-06-16 DIAGNOSIS — R69 Illness, unspecified: Secondary | ICD-10-CM | POA: Diagnosis not present

## 2022-06-16 MED ORDER — HYDROCODONE-ACETAMINOPHEN 7.5-325 MG PO TABS
1.0000 | ORAL_TABLET | Freq: Four times a day (QID) | ORAL | 0 refills | Status: DC | PRN
Start: 2022-06-16 — End: 2022-08-04

## 2022-06-16 MED ORDER — FLUTICASONE PROPIONATE 50 MCG/ACT NA SUSP: 2.0000 | Freq: Every day | NASAL | 6 refills | Status: AC

## 2022-06-16 MED ORDER — ESCITALOPRAM OXALATE 5 MG PO TABS
10.0000 mg | ORAL_TABLET | Freq: Every day | ORAL | 0 refills | Status: DC
Start: 2022-06-16 — End: 2022-07-06

## 2022-06-16 NOTE — Progress Notes (Signed)
Chief Complaint  Patient presents with   Follow-up    Subjective Tommy Green presents for f/u anxiety/depression.  Pt is currently being treated with Lexapro 5 mg/d.  Reports some improvement since treatment. No thoughts of harming self or others. No self-medication with alcohol, prescription drugs or illicit drugs. Pt is not following with a counselor/psychologist.  Fatigue The patient and his wife report he has high muscle fatigue, particularly in the evening with a will need to go to sleep around 7 PM.  He wakes up around 6:30 AM feeling relatively well refreshed.  He does snore loudly at night.  Mood is noted as above.  Diet is okay, he stays active with therapy but does not want to exercise at home due to the lack of energy.  He had a sleep study done several decades ago that suggested he would benefit from CPAP but he never went through with it.  His wife notes he will sometimes pause his breathing at night.  Ear pain The patient has a history of bilateral ear pain.  He was treated for TMJ syndrome without significant relief.  He has been having a productive cough since his surgery and is wondering if he needs to see his heart doctor.  He does not take anything for allergies currently.  Past Medical History:  Diagnosis Date   Anemia    Essential hypertension 07/18/2017   Foot drop, left foot    due to back surgery in 01/2021   GERD (gastroesophageal reflux disease)    History of colonic polyps    History of hiatal hernia    many years ago   History of rheumatic fever    History of stroke 07/18/2017   Hypertriglyceridemia 07/18/2017   S/P TAVR (transcatheter aortic valve replacement) 01/26/2022   [name] 32m via TF approach with Dr. [name] Dr. BCyndia Bent  Severe aortic stenosis    Type 2 diabetes mellitus with diabetic neuropathy, unspecified ([place] 07/18/2017   Allergies as of 06/16/2022       Reactions   Fluzone Quadrivalent [influenza Vac Split Quad] Other  (See Comments)   "Got the flu"   Influenza Vaccines Other (See Comments)   "Got the flu"   Atorvastatin Other (See Comments)   [facility] upset[facility]  Statins Other (See Comments)   Muscle pain- "allergic," per MAR   Aspirin Other (See Comments)   Gastroesophageal reflux and "allergic," per MAR   Canagliflozin Other (See Comments)   Pt reports that the use of Invokana caused acute KIDNEY FAILURE ([facility] records) and [facility]all[facility]," [facility]       Medication List        Accurate as of June 16, 2022 12:42 PM. If you have any questions, ask your nurse or doctor.          STOP taking these medications    HYDROcodone-acetaminophen 10-325 MG tablet Commonly known as: NORCO Replaced by: HYDROcodone-acetaminophen 7.5-325 MG tablet Stopped by: NShelda Pal DO       TAKE these medications    acetaminophen 325 MG tablet Commonly known as: TYLENOL Take 650 mg by mouth every 6 (six) hours as needed for mild pain or headache.   amLODipine 5 MG tablet Commonly known as: NORVASC Take 1 tablet (5 mg total) by mouth daily.   amoxicillin 500 MG tablet Commonly known as: AMOXIL Take 4 tablets (2,000 mg total) by mouth as directed. 1 HOUR PRIOR TO DENTAL APPOINTMENTS  aspirin 81 MG chewable tablet Chew 1 tablet (81 mg total) by mouth daily.   B COMPLEX PO Take 1 tablet by mouth daily.   cyanocobalamin 1000 MCG tablet Commonly known as: VITAMIN B12 Take 1,000 mcg by mouth daily.   Enbrel Mini 50 MG/ML Soct Generic drug: Etanercept Inject 50 mg into the skin once a week.   escitalopram 5 MG tablet Commonly known as: LEXAPRO Take 2 tablets (10 mg total) by mouth daily. What changed: how much to take Changed by: [name], DO   ezetimibe 10 MG tablet Commonly known as: Zetia Take 1 tablet (10 mg total) by mouth daily. For elevated cholesterol and triglycerides   fenofibrate micronized 200 MG capsule Commonly known as:  LOFIBRA TAKE 1 CAPSULE DAILY BEFOREBREAKFAST   fluticasone 50 MCG/ACT nasal spray Commonly known as: FLONASE Place 2 sprays into both nostrils daily. Started by: [name], DO   furosemide 80 MG tablet Commonly known as: LASIX Take 1 tablet (80 mg total) by mouth daily.   HYDROcodone-acetaminophen 7.5-325 MG tablet Commonly known as: Norco Take 1 tablet by mouth every 6 (six) hours as needed for moderate pain. Replaces: HYDROcodone-acetaminophen 10-325 MG tablet Started by: [name], DO   insulin degludec 100 UNIT/ML FlexTouch Pen Commonly known as: TRESIBA Inject 30 Units into the skin at bedtime.   leflunomide 10 MG tablet Commonly known as: ARAVA Take 1 tablet (10 mg total) by mouth daily.   lisinopril 40 MG tablet Commonly known as: ZESTRIL Take 1 tablet (40 mg total) by mouth in the morning.   magnesium oxide 400 MG tablet Commonly known as: MAG-OX Take 400 mg by mouth daily with lunch.   melatonin 5 MG Tabs Take 5 mg by mouth at bedtime.   multivitamin with minerals tablet Take 1 tablet by mouth daily with breakfast.   NovoLOG FlexPen 100 UNIT/ML FlexPen Generic drug: insulin aspart Inject 0-10 Units into the skin See admin instructions. Inject 0-10 units into the skin before meals, PER SLIDING SCALE: BGL 0-200 = give nothing, 201-250 = 2 units, 251-300 = 4 units, 301-350 = 6 units, 351-400 = 8 units, >400 = 10 units, push fluids, and repeat BGL check in 2 hours. If BGL remains >400 after 2 hours, CALL MD.   Omega 3 1000 MG Caps Take 1,000 mg by mouth daily with lunch.   omeprazole 20 MG capsule Commonly known as: PRILOSEC TAKE 1 CAPSULE DAILY BEFOREBREAKFAST   OneTouch Verio test strip Generic drug: glucose blood 4 (four) times daily.   Potassium 99 MG Tabs Take 99 mg by mouth every evening.   SINEX REGULAR NA Place 1 spray into both nostrils daily as needed.   triamcinolone 0.1 % paste Commonly known as: KENALOG Use as  directed 1 Application in the mouth or throat 2 (two) times daily.   triamcinolone cream 0.1 % Commonly known as: KENALOG Apply 1 Application topically 2 (two) times daily.   Vitamin D3 Super Strength 50 MCG (2000 UT) Caps Generic drug: Cholecalciferol Take 2,000 Units by mouth daily.   vitamin E 180 MG (400 UNITS) capsule Take 400 Units by mouth daily.        Exam BP 132/78   Pulse 96   Temp 98.1 F (36.7 C) (Oral)   Ht '5\' 11"'$  (1.803 m)   Wt 234 lb (106.1 kg)   SpO2 96%   BMI 32.64 kg/m  General:  well developed, well nourished, in no apparent distress HEENT: Nares patent without  rhinorrhea, ear canals are patent without otorrhea, TMs negative bilaterally, MMM Heart: RRR, 2+ pitting edema tapering at the proximal third of the tibia bilaterally Lungs:  CTAB. No respiratory distress MSK: Nodules noted of bilateral hands. Psych: well oriented with normal range of affect and age-appropriate judgement/insight, alert and oriented x4.  Assessment and Plan  Current mild episode of major depressive disorder without prior episode ([place])  Snoring - Plan: Ambulatory referral to Pulmonology  Other fatigue - Plan: Ambulatory referral to Pulmonology  Otalgia of both ears - Plan: fluticasone (FLONASE) 50 MCG/ACT nasal spray  Pain in both hands - Plan: Ambulatory referral to Hand Surgery  Chronic, not fully controlled yet.  We will increase his Lexapro from 5 mg daily to 10 mg daily.  He will send me a message in 1 month if he is doing. We will want to have him evaluated by the sleep team. '' Nasal spray recommended, would consider ENT referral if no improvement. Refer to hand team.  He may have rheumatoid nodules. The patient voiced understanding and agreement to the plan.  [place], DO 06/16/22 12:42 PM

## 2022-06-16 NOTE — Patient Instructions (Addendum)
If you do not hear anything about your referral in the next 1-2 weeks, call our office and ask for an update.  Keep the diet clean and stay active.  Claritin (loratadine), Allegra (fexofenadine), Zyrtec (cetirizine) which is also equivalent to Xyzal (levocetirizine); these are listed in order from weakest to strongest. Generic, and therefore cheaper, options are in the parentheses.   Flonase (fluticasone); nasal spray that is over the counter. 2 sprays each nostril, once daily. Aim towards the same side eye when you spray.  There are available OTC, and the generic versions, which may be cheaper, are in parentheses. Show this to a pharmacist if you have trouble finding any of these items.  Let us know if you need anything.

## 2022-06-17 DIAGNOSIS — Z9181 History of falling: Secondary | ICD-10-CM | POA: Diagnosis not present

## 2022-06-17 DIAGNOSIS — Z723 Lack of physical exercise: Secondary | ICD-10-CM | POA: Diagnosis not present

## 2022-06-17 DIAGNOSIS — M6281 Muscle weakness (generalized): Secondary | ICD-10-CM | POA: Diagnosis not present

## 2022-06-17 DIAGNOSIS — R2689 Other abnormalities of gait and mobility: Secondary | ICD-10-CM | POA: Diagnosis not present

## 2022-06-18 ENCOUNTER — Other Ambulatory Visit: Payer: Self-pay | Admitting: Family Medicine

## 2022-06-22 ENCOUNTER — Ambulatory Visit (INDEPENDENT_AMBULATORY_CARE_PROVIDER_SITE_OTHER): Payer: Medicare HMO | Admitting: Pharmacist

## 2022-06-22 ENCOUNTER — Telehealth: Payer: Self-pay | Admitting: Rheumatology

## 2022-06-22 DIAGNOSIS — I10 Essential (primary) hypertension: Secondary | ICD-10-CM

## 2022-06-22 DIAGNOSIS — R2689 Other abnormalities of gait and mobility: Secondary | ICD-10-CM | POA: Diagnosis not present

## 2022-06-22 DIAGNOSIS — Z723 Lack of physical exercise: Secondary | ICD-10-CM | POA: Diagnosis not present

## 2022-06-22 DIAGNOSIS — M6281 Muscle weakness (generalized): Secondary | ICD-10-CM | POA: Diagnosis not present

## 2022-06-22 DIAGNOSIS — Z9181 History of falling: Secondary | ICD-10-CM | POA: Diagnosis not present

## 2022-06-22 DIAGNOSIS — Z794 Long term (current) use of insulin: Secondary | ICD-10-CM

## 2022-06-22 DIAGNOSIS — E1169 Type 2 diabetes mellitus with other specified complication: Secondary | ICD-10-CM

## 2022-06-22 NOTE — Telephone Encounter (Signed)
Patients wife called the office stating Tommy Green's Enbrel mini device battery died and [employer] needs a new prescription to replace the device. She states they will be faxing a request today at well.

## 2022-06-22 NOTE — Patient Instructions (Signed)
Tommy Green It was a pleasure speaking with you today.  Below is a summary of your health goals and summary of our recent visit. You can also view your updated Chronic Care Management Care plan through your MyChart account.   Patient Goals/Self-Care Activities Over the next 90 days, patient will:  take medications as prescribed,  check glucose 3 to 4 times a day prior to meals and at bedtime or as needed, document, and provide at future appointments,  check blood pressure 2 to 3 times per week, document, and provide at future appointments, and  How to Treat a Low Glucose Level:  If you have a low blood glucose less than 70, please eat / drink 15 grams of carbohydrates (4 oz of juice, soda (not diet), 4 glucose tablets, or 3-4 pieces of hard candy).  It is best so choose a "quick" source of sugar that does no contain fat (chocolate and peanut butter might take longer to increase your blood glucose) Wait 15 minutes and then recheck your blood glucose. If your blood glucose is still less than 70, eat another 15 grams of carbohydrates.  Wait another 15 minutes and recheck your glucose.  Continue this until your blood glucose is over 70. Once you blood glucose is over 70, eat a snack with protein in it to prevent your blood glucose from dropping again. limit use of nasal spray decongestant to prevent rebound congestion - do not use more than 3 day in a row.    Follow Up Plan: Telephone follow up appointment with care management team member scheduled for:  12 weeks.      As always if you have any questions or concerns especially regarding medications, please feel free to contact me either at the phone number below or with a MyChart message.   Keep up the good work!  [name], PharmD Clinical Pharmacist [place] [phone] (direct line)  [phone] (main office number)   The patient verbalized understanding of instructions, educational materials,  and care plan provided today and DECLINED offer to receive copy of patient instructions, educational materials, and care plan.

## 2022-06-22 NOTE — Chronic Care Management (AMB) (Signed)
Chronic Care Management Pharmacy Note  06/22/2022 Name:  Tommy Green MRN:  [id number] DOB:  07-02-1946  Summary: Aortic Stenosis: Patient had TAVR 01/07/2022. Patient is taking aspirin 56m daily  DM, insulin dependent: Had low blood glucose 06/14/2022 of 49.  Highest blood glucose was 175. Patient saw Dr DMeredith Pel05/2023. He also had eye exam in January 2023 per patient. [facility] but we have not received copy of notes.  Hypertension: Recent home blood pressure has been at goal, ranging form 115 to 138 / 70's. Patient endorses adherence to blood pressure medications. Reviewed refill history and had filled lisinopril and amlodipine on time for 2023. Hyperlipidemia: Patient restarted ezetimibe and is tolerating well. Due to have lipids rechecked with next labs.   Plan: follow up 12 weeks to review medication adherence, blood pressure and blood glucose.   Subjective: Tommy Green is an 76y.o. year old male who is a primary patient of [name] DO.  The CCM team was consulted for assistance with disease management and care coordination needs.    Engaged with patient by telephone for follow up visit in response to provider referral for pharmacy case management and/or care coordination services.   Consent to Services:  The patient was given information about Chronic Care Management services, agreed to services, and gave verbal consent prior to initiation of services.  Please see initial visit note for detailed documentation.   Patient Care Team: [name] DO as PCP - General (Family Medicine) [name] MD as PCP - Cardiology (Cardiology) [name] MD as Referring Physician (Internal Medicine) [name] OD (Optometry) [name] MD (Orthopedic Surgery) [name] RPH-CPP (Pharmacist)  Recent office visits: 06/16/2022 - Fam Med (Dr WNani Ravens F/U depression. Referred to pulmonology  due to fatigue and snoring. Also referred to hand surgeon. Prescribed Flonase nasal spray. Increased escitalopram to 178mdaily.  05/17/2022 - Fam Med (Dr WeNani RavensSeen for depression and to drain cyst. Started escitalopram 99m26maily. Referred to physical therapy.  04/30/2022 - Fam Med (Dr WenNani Ravenseen for spot on leg and cyst on hands. Prescribed triamcinolone cream. Acetaminophen 1000m61mery 6 hours as needed.  04/05/2022 - Fam Med (Dr WendNani Ravensen for lower extremity edema and chronic back pain. Prescribed trimacinolono paste for mouth ulcer Hydrocodone/APAP for pain 10/3299mg599mtake 0.5 tablet twice a day as needed. 03/22/2022 - Fam Med (Dr WendlNani Ravensn for LLE. Increased furosemide to 80mg 31my. F/U 2 weeks.  03/05/2022 - Fam Med (Dr WendliNani Ravens for impacted cerumen of right ear and LEE. Started furosemide 40mg d33m. Ear irrigation. 12/28/2021 - Fam Med (Dr WendlinNani Ravens Visit. Left ear pain. Prescribed prednisone 40mg - 33m 2 tablets = 80mg for93mays.  12/21/2021 - Fam Med (Dr Wendling)Nani Ravensr hospital follow up / pre syncope. Recommended suspending physical therapy for awhile. Check CBC, CMP, magnesium and phosphorus.  .   Recent consult visits:  03/25/2022 - Rheumatology (Dr DeveshwarEstanislado Pandyr RA and OA. Continued Enbrel and leflunomide. Recommended annual skin exam. F/U 5 months 03/03/2022 - [facility] [facility] Continued ASA 81mg dail110m/U 1 year 03/01/2022 - Ortho Surgery (Dr Begovich) Arrie Eastern weakness and numbness. Referred for electrodiagnositic testing. Testing noted evidence suggestive of senorimotor peripheral polyneuropathy in left LE. Recommended diabetes control, control of left LEE.  02/03/2022 - Cardio (McDaniel,Glenford PeersTAVR and aortic valve stenosis. Prescribed amoxicillin to be used prior  to dental procedures. Labs ordered. EKG and MRI of abdomen also ordered.  01/07/2022 - Cartiothoracic surgery (Dr [name]) Surgical consult for aortic valve  replacement. Patient scheduled for TAVR.     Hospital visits: 01/26/2022 - Hospital admission for TAVR at [facility]. Medications started at discharge: Aspirin 24m daily    Objective:  Lab Results  Component Value Date   CREATININE 1.91 (H) 03/30/2022   CREATININE 1.68 (H) 03/12/2022   CREATININE 1.41 (H) 02/03/2022    Lab Results  Component Value Date   HGBA1C 5.7 (H) 01/22/2022   Last diabetic Eye exam: No results found for: "HMDIABEYEEXA"  Last diabetic Foot exam: No results found for: "HMDIABFOOTEX"      Component Value Date/Time   CHOL 187 05/04/2021 0852   TRIG 315.0 (H) 05/04/2021 0852   HDL 28.10 (L) 05/04/2021 0852   CHOLHDL 7 05/04/2021 0852   VLDL 63.0 (H) 05/04/2021 0852   LDLCALC 79 03/06/2021 0413   LDLDIRECT 107.0 05/04/2021 0852       Latest Ref Rng & Units 01/22/2022   11:44 AM 12/21/2021   11:46 AM 12/12/2021    3:42 AM  Hepatic Function  Total Protein 6.5 - 8.1 g/dL 6.3  6.4  6.0   Albumin 3.5 - 5.0 g/dL 3.5  4.0  3.2   AST 15 - 41 U/L _0 ALT 0 - 44 U/L _1 Alk Phosphatase 38 - 126 U/L 36  34  27   Total Bilirubin 0.3 - 1.2 mg/dL 0.4  0.3  0.4     No results found for: "TSH", "FREET4"     Latest Ref Rng & Units 02/03/2022   10:25 AM 01/27/2022    1:20 AM 01/26/2022   10:05 AM  CBC  WBC 3.4 - 10.8 x10E3/uL 8.4  8.0    Hemoglobin 13.0 - 17.7 g/dL 8.0  7.9  7.5   Hematocrit 37.5 - 51.0 % 25.2  25.0  22.0   Platelets 150 - 450 x10E3/uL 296  252      Lab Results  Component Value Date/Time   VD25OH 26.88 (L) 10/24/2017 10:41 AM    Clinical ASCVD: Yes  The ASCVD Risk score (Arnett DK, et al., 2019) failed to calculate for the following reasons:   The patient has a prior MI or stroke diagnosis     Social History   Tobacco Use  Smoking Status Former   Packs/day: 1.00   Years: 10.00   Total pack years: 10.00   Types: Cigarettes   Quit date: 135  Years since quitting: 50.7  Smokeless Tobacco Never   BP  Readings from Last 3 Encounters:  06/16/22 132/78  05/17/22 128/82  04/30/22 132/70   Pulse Readings from Last 3 Encounters:  06/16/22 96  05/17/22 92  04/30/22 98   Wt Readings from Last 3 Encounters:  06/16/22 234 lb (106.1 kg)  05/17/22 234 lb 8 oz (106.4 kg)  04/30/22 234 lb 8 oz (106.4 kg)    Assessment: Review of patient past medical history, allergies, medications, health status, including review of consultants reports, laboratory and other test data, was performed as part of comprehensive evaluation and provision of chronic care management services.   SDOH:  (Social Determinants of Health) assessments and interventions performed:  SDOH Interventions    Flowsheet Row [facility] [place] [place] Management from 11/25/2021 in [employer] at [place] Interventions     Food Insecurity Interventions -- Intervention Not Indicated --  Transportation Interventions -- Intervention Not Indicated  [spouse provides transportation] --  Financial Strain Interventions Intervention Not Indicated -- Intervention Not Indicated  Physical Activity Interventions -- -- Other (Comments)  [currently in rehab post back surgery]        CCM Care Plan  Allergies  Allergen Reactions   Fluzone Quadrivalent [place] Other (See Comments)    "Got the flu"   Influenza Vaccines Other (See Comments)    "Got the flu"   Atorvastatin Other (See Comments)    Myalgias, GI upset, and "allergic," per [facility]   Statins Other (See Comments)    Muscle pain- "allergic," per MAR   Aspirin Other (See Comments)    Gastroesophageal reflux and "allergic," per MAR    Canagliflozin Other (See Comments)    Pt reports that the use of Invokana caused acute KIDNEY FAILURE ([facility] records) and "allergic," per  [facility]    Medications Reviewed Today     Reviewed by [name], RPH-CPP (Pharmacist) on 06/22/22 at [place] List Status: <None>   Medication Order Taking? Sig Documenting Provider Last Dose Status Informant  acetaminophen (TYLENOL) 325 MG tablet [id number] Yes Take 650 mg by mouth every 6 (six) hours as needed for mild pain or headache. [provider] Taking Active Self  amLODipine (NORVASC) 5 MG tablet [id number] Yes Take 1 tablet (5 mg total) by mouth daily. [name], DO Taking Active Self  amoxicillin (AMOXIL) 500 MG tablet [id number] Yes Take 4 tablets (2,000 mg total) by mouth as directed. 1 HOUR PRIOR TO DENTAL APPOINTMENTS [name], MD Taking Active   aspirin 81 MG chewable tablet [id number] Yes Chew 1 tablet (81 mg total) by mouth daily. Kathyrn Drown D, NP Taking Active   B Complex Vitamins (B COMPLEX PO) [id number] Yes Take 1 tablet by mouth daily. [provider] Taking Active Self  Cholecalciferol (VITAMIN D3 SUPER STRENGTH) 50 MCG (2000 UT) CAPS [id number] Yes Take 2,000 Units by mouth daily. [provider] Taking Active Self  escitalopram (LEXAPRO) 5 MG tablet [id number] Yes Take 2 tablets (10 mg total) by mouth daily. [name], DO Taking Active   Etanercept (ENBREL MINI) 50 MG/ML SOCT [id number] Yes Inject 50 mg into the skin once a week. [name], PA-C Taking Active   ezetimibe (ZETIA) 10 MG tablet [id number] Yes Take 1 tablet (10 mg total) by mouth daily. For elevated cholesterol and triglycerides [name], DO Taking Active Self  fenofibrate micronized (LOFIBRA) 200 MG capsule [id number] Yes TAKE 1 CAPSULE DAILY BEFOREBREAKFAST [name], DO Taking Active Self  fluticasone (FLONASE) 50 MCG/ACT nasal spray [id number] Yes Place 2 sprays into both nostrils daily. [name], DO Taking Active   furosemide (LASIX) 80 MG tablet [id number] Yes TAKE 1 TABLET BY MOUTH EVERY DAY  [name], DO Taking Active   HYDROcodone-acetaminophen (Roberts) 7.5-325 MG tablet [id number] Yes Take 1 tablet by mouth every 6 (six) hours as needed for moderate pain. [name], DO Taking Active   insulin aspart (NOVOLOG FLEXPEN) 100 UNIT/ML FlexPen [id number] Yes Inject 0-10 Units into the skin See admin instructions. Inject 0-10 units into the skin before meals, PER SLIDING SCALE: BGL 0-200 = give nothing, 201-250 = 2 units, 251-300 = 4 units, 301-350 =  6 units, 351-400 = 8 units, >400 = 10 units, push fluids, and repeat BGL check in 2 hours. If BGL remains >400 after 2 hours, CALL MD. [provider] Taking Active Self  insulin degludec (TRESIBA) 100 UNIT/ML FlexTouch Pen [id number] Yes Inject 30 Units into the skin at bedtime. Donne Hazel, MD Taking Active Self  leflunomide (ARAVA) 10 MG tablet [id number] Yes Take 1 tablet (10 mg total) by mouth daily. [name], PA-C Taking Active   lisinopril (ZESTRIL) 40 MG tablet [id number] Yes Take 1 tablet (40 mg total) by mouth in the morning. [name], DO Taking Active Self  magnesium oxide (MAG-OX) 400 MG tablet [id number] Yes Take 400 mg by mouth daily with lunch. [provider] Taking Active Self  melatonin 5 MG TABS [id number] Yes Take 5 mg by mouth at bedtime. [provider] Taking Active Self  Multiple Vitamins-Minerals (MULTIVITAMIN WITH MINERALS) tablet [id number] Yes Take 1 tablet by mouth daily with breakfast. [provider] Taking Active Self  Omega 3 1000 MG CAPS [id number] Yes Take 1,000 mg by mouth daily with lunch. [provider] Taking Active Self  omeprazole (PRILOSEC) 20 MG capsule [id number] Yes TAKE 1 CAPSULE DAILY BEFOREBREAKFAST [name], DO Taking Active   [facility] VERIO test strip [id number] Yes 4 (four) times daily. [provider] Taking Active Self  Phenylephrine HCl (SINEX REGULAR NA) [id number] Yes Place 1 spray into  both nostrils daily as needed. [provider] Taking Active Self  Potassium 99 MG TABS [id number] Yes Take 99 mg by mouth every evening. [provider] Taking Active Self  triamcinolone (KENALOG) 0.1 % paste [id number] Yes Use as directed 1 Application in the mouth or throat 2 (two) times daily. [name], DO Taking Active   triamcinolone cream (KENALOG) 0.1 % [id number] Yes Apply 1 Application topically 2 (two) times daily. [name], DO Taking Active   vitamin B-12 (CYANOCOBALAMIN) 1000 MCG tablet [id number] Yes Take 1,000 mcg by mouth daily. [provider] Taking Active Self  vitamin E 180 MG (400 UNITS) capsule [id number] Yes Take 400 Units by mouth daily. [provider] Taking Active Self            Patient Active Problem List   Diagnosis Date Noted   Current mild episode of major depressive disorder without prior episode ([place]) 05/17/2022   Other chronic pain 03/05/2022   Bilateral lower extremity edema 03/05/2022   Severe aortic stenosis 01/26/2022   S/P TAVR (transcatheter aortic valve replacement) 01/26/2022   Microcytic anemia 12/10/2021   Obesity (BMI 30-39.9) 12/10/2021   Near syncope 12/09/2021   Aortic stenosis 12/09/2021   Rheumatoid arthritis ([place]) 12/09/2021   Foot drop, left foot 10/02/2021   History of colonic polyps 10/02/2021   History of rheumatic fever 10/02/2021   Stress fracture of left foot 09/03/2021   Gabapentin overdose, accidental or unintentional, initial encounter 03/08/2021   TIA (transient ischemic attack) 03/05/2021   Body mass index (BMI) 36.0-36.9, adult 10/03/2020   Subdural hemorrhage following injury without open intracranial wound and with prolonged loss of consciousness (more than 24 hours) without return to pre-existing conscious level ([place]) 10/03/2020   AKI (acute kidney injury) ([place])    Acute encephalopathy    SDH (subdural hematoma) ([place]) 76/54/6503   Cyclic vomiting  syndrome 08/06/2019   Neuropathy 12/25/2018   AK (actinic keratosis) 08/24/2018   Neoplasm of uncertain behavior 54/65/6812   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   Tendinopathy of right gluteus  medius 07/13/2018   Tendinopathy of left gluteus medius 07/13/2018   Squamous cell carcinoma in situ (SCCIS) of skin 03/24/2018   Vitamin D deficiency 11/29/2017   Stage 3 chronic kidney disease ([place]) 10/28/2017   Primary osteoarthritis of right hip 09/11/2017   Essential hypertension 07/18/2017   Hyperlipidemia associated with type 2 diabetes mellitus ([place]) 07/18/2017   Type 2 diabetes mellitus with diabetic neuropathy, unspecified ([place]) 07/18/2017   Heart murmur 07/18/2017   Hypertriglyceridemia 07/18/2017   History of stroke 07/18/2017   DDD (degenerative disc disease), lumbar 06/15/2017   Iron deficiency anemia 06/15/2017   Stroke ([place]) 06/15/2017   Encounter for hepatitis C screening test for low risk patient 06/30/2016   Anemia 06/19/2016   Microalbuminuria due to type 2 diabetes mellitus ([place]) 03/19/2016   Hypertrophy of inferior nasal turbinate 12/20/2015   Disease of nasal cavity and sinuses 12/10/2015   Degenerative tear of acetabular labrum of left hip 11/04/2015   History of tobacco abuse 05/15/2015   Morbid (severe) obesity due to excess calories ([place]) 04/11/2015   Disorder of both eustachian tubes 01/27/2015   Maxillary sinusitis 01/13/2015   Lipoprotein deficiency 01/31/2014   Gastro-esophageal reflux disease without esophagitis 11/02/2013   Rheumatic fever 11/02/2013   Erectile dysfunction associated with type 2 diabetes mellitus ([place]) 07/31/2013   Type 2 diabetes mellitus with hyperglycemia, without long-term current use of insulin ([place]) 11/16/2012    Immunization History  Administered Date(s) Administered   Fluad Quad(high Dose 65+) 08/31/2021   Influenza-Unspecified 07/31/2013, 04/17/2014   Moderna Sars-Covid-2 Vaccination 12/11/2019, 01/10/2020, 08/08/2020   Pneumococcal  Conjugate-13 08/07/2015   Pneumococcal Polysaccharide-23 04/17/2013, 07/18/2013   Td 12/25/2018   Zoster, Live 04/17/2013    Conditions to be addressed/monitored: CAD, HTN, HLD, DMII, and dry mouth / dental issues; neuropathy; TAVR; aortic stenosis, severe; CKD - 3  Care Plan : General Pharmacy (Adult)  Updates made by [name], RPH-CPP since 06/22/2022 12:00 AM     Problem: Chronic conditions: HTN; Rheumatoid arthritis; dry mouth; chronic back pain; HDL with hypertriglyceridemia; Type 2 DM; CKD;Nonrheumatic aortic valve stenosis; anemia      Long-Range Goal: Provide education, support and care coordination for medication therapy and chronic conditions   Start Date: 11/25/2021  This Visit's Progress: On track  Priority: High  Note:   Current Barriers:  Unable to achieve control of hyperlipidemia, mixed with elevated LDL and triglycerides  Does not adhere to prescribed medication regimen Multiple comorbidities Complex medication regimen  Pharmacist Clinical Goal(s):  Over the next 90 days, patient will achieve adherence to monitoring guidelines and medication adherence to achieve therapeutic efficacy achieve control of mixed hyperlipidemia  as evidenced by LDL < 70 maintain control of hypertension and type 2 DM as evidenced by blood pressure < 130/80 and A1c < 7.0  through collaboration with PharmD and provider.   Interventions: 1:1 collaboration with [name], DO regarding development and update of comprehensive plan of care as evidenced by provider attestation and co-signature Inter-disciplinary care team collaboration (see longitudinal plan of care) Comprehensive medication review performed; medication list updated in electronic medical record   Diabetes / CKD - 3a: Diabetes controlled per last A1c - was 5.7% 01/22/2022. A1c goal < 7.0%  Lab Results  Component Value Date   CREATININE 1.91 (H) 03/30/2022   CREATININE 1.68 (H) 03/12/2022   CREATININE 1.41  (H) 02/03/2022  Endocrinologist is Dr Meredith Pel. Patient reports he saw Dr Meredith Pel 02/2022.  Current treatment: Tresiba 30 units daily at bedtime Novolog insulin  inject 0 to 10 units per sliding scale prior to each meal (patient reports he usually only needs once per day) Current glucose readings: 105 to 180 Had hypoglycemia 06/14/2022. Blood glucose was 49. Patient treated with OJ and blood glucose returned to normal range. He reports low blood glucose is very rare.  Current exercise: none currently Interventions:  Might consider adding SGLT Discussed Continuous Glucose Monitor - patient not interested at this time, will continue to check blood glucose 3 to 4 times daily with fingerstick glucometer.  How to Treat a Low Glucose Level:  If you have a low blood glucose less than 70, please eat / drink 15 grams of carbohydrates (4 oz of juice, soda (not diet), 4 glucose tablets, or 3-4 pieces of hard candy).  It is best so choose a "quick" source of sugar that does no contain fat (chocolate and peanut butter might take longer to increase your blood glucose) Wait 15 minutes and then recheck your blood glucose. If your blood glucose is still less than 70, eat another 15 grams of carbohydrates.  Wait another 15 minutes and recheck your glucose.  Continue this until your blood glucose is over 70. Once you blood glucose is over 70, eat a snack with protein in it to prevent your blood glucose from dropping again. Reviewed home blood glucose readings and reviewed goals  Fasting blood glucose goal (before meals) = 80 to 130 Blood glucose goal after a meal = less than 180   Hypertension: Controlled; blood pressure goal < 130/80 Current treatment: Amlodipine 11m daily  Lisinopril 44mdaily  Previous therapy tried: carvedilol - stopped due to suspected dry mouth Current home readings:  SBP 115-138 DBP 70 to 80 Hospitalization for syncope in early 2023. Patient had TAVR 12/2021 and tolerated procedure  well. Reports improved energy Denies dizziness Interventions:  Continue to take lisinopril and amlodipine Continue to check blood pressure daily and record  Hyperlipidemia, mixed: Uncontrolled; LDL goal < 70 and Tg goal < 150 current treatment: Fenofibrate 20026maily with breakfast.  Ezetimbe 32m24mily Fish oil 1000mg75mly Medications previously tried: atorvastatin - myalgias; Has also taken pravastatin and lovastatin in past; notation that patient is intolerant to all statins Patient has restarted ezetimibe and is tolerating well.  Interventions:  Continue ezetimibe 32mg 47my, fenofibrate and fish oil. Due to have lipids rechecked.   Medication management Pharmacist Clinical Goal(s): Over the next 90 days, patient will work with PharmD and providers to maintain optimal medication adherence Current pharmacy: [facility] Comprehensive medication review performed. Reviewed refill history and assessed adherence Continue current medication management strategy Patient instructed to start aspirin 81mg d64m after TAVR but did not start due to side effects - he states causes GI problems. Also noted to have low HGB. Cardiology office aware.   Reminded patient to limit use of nasal spray decongestant - do not use more than 3 day in a row.   Patient Goals/Self-Care Activities Over the next 90 days, patient will:  take medications as prescribed,  check glucose 3 to 4 times a day prior to meals and at bedtime or as needed, document, and provide at future appointments,  check blood pressure 2 to 3 times per week, document, and provide at future appointments, and  How to Treat a Low Glucose Level:  If you have a low blood glucose less than 70, please eat / drink 15 grams of carbohydrates (4 oz of juice, soda (not diet), 4 glucose tablets, or 3-4 pieces  of hard candy).  It is best so choose a "quick" source of sugar that does no contain fat (chocolate and peanut butter might  take longer to increase your blood glucose) Wait 15 minutes and then recheck your blood glucose. If your blood glucose is still less than 70, eat another 15 grams of carbohydrates.  Wait another 15 minutes and recheck your glucose.  Continue this until your blood glucose is over 70. Once you blood glucose is over 70, eat a snack with protein in it to prevent your blood glucose from dropping again. limit use of nasal spray decongestant to prevent rebound congestion - do not use more than 3 day in a row.   Follow Up Plan: Telephone follow up appointment with care management team member scheduled for:  12 weeks; will continue to follow adherence for lisinopril since adherence for 2022 was < 80%         Medication Assistance: None required.  Patient affirms current coverage meets needs.  Patient's preferred pharmacy is:  [facility] [address] [place] [facility] [address] [facility] [address] [place] [address] Phone: [phone] Fax: [phone]  [id number] [address] [address]Phone: [phone]: [address]Phone: [phone]: [phone]  Follow Up:  Patient agrees to Care Plan and Follow-up.  Plan: Telephone follow up appointment with care management team member scheduled for:  12 weeks to recheck adherence  TCherre Robins PharmD Clinical Pharmacist [facility] SW [facility]

## 2022-06-24 DIAGNOSIS — Z86008 Personal history of in-situ neoplasm of other site: Secondary | ICD-10-CM | POA: Diagnosis not present

## 2022-06-24 DIAGNOSIS — R2689 Other abnormalities of gait and mobility: Secondary | ICD-10-CM | POA: Diagnosis not present

## 2022-06-24 DIAGNOSIS — M6281 Muscle weakness (generalized): Secondary | ICD-10-CM | POA: Diagnosis not present

## 2022-06-24 DIAGNOSIS — Z129 Encounter for screening for malignant neoplasm, site unspecified: Secondary | ICD-10-CM | POA: Diagnosis not present

## 2022-06-24 DIAGNOSIS — L2089 Other atopic dermatitis: Secondary | ICD-10-CM | POA: Diagnosis not present

## 2022-06-24 DIAGNOSIS — D0422 Carcinoma in situ of skin of left ear and external auricular canal: Secondary | ICD-10-CM | POA: Diagnosis not present

## 2022-06-24 DIAGNOSIS — L905 Scar conditions and fibrosis of skin: Secondary | ICD-10-CM | POA: Diagnosis not present

## 2022-06-24 DIAGNOSIS — M0630 Rheumatoid nodule, unspecified site: Secondary | ICD-10-CM | POA: Diagnosis not present

## 2022-06-24 DIAGNOSIS — D485 Neoplasm of uncertain behavior of skin: Secondary | ICD-10-CM | POA: Diagnosis not present

## 2022-06-24 DIAGNOSIS — Z859 Personal history of malignant neoplasm, unspecified: Secondary | ICD-10-CM | POA: Diagnosis not present

## 2022-06-24 DIAGNOSIS — L821 Other seborrheic keratosis: Secondary | ICD-10-CM | POA: Diagnosis not present

## 2022-06-24 DIAGNOSIS — Z9181 History of falling: Secondary | ICD-10-CM | POA: Diagnosis not present

## 2022-06-24 DIAGNOSIS — Z723 Lack of physical exercise: Secondary | ICD-10-CM | POA: Diagnosis not present

## 2022-06-24 NOTE — Telephone Encounter (Signed)
Spoke with Debbie at [employer] and gave verbal authorization for them to replace the Enbrel Auto touch.

## 2022-06-28 ENCOUNTER — Institutional Professional Consult (permissible substitution): Payer: Medicare HMO | Admitting: Nurse Practitioner

## 2022-06-28 NOTE — Progress Notes (Unsigned)
Cardiology Office Note:    Date:  06/29/2022   ID:  Tommy Green, DOB 05/22/1946, MRN [id number]  PCP:  [name], DO  Cardiologist:  Shirlee More, MD    Referring MD: [name]*    ASSESSMENT:    1. S/P TAVR (transcatheter aortic valve replacement)   2. Essential hypertension   3. Hyperlipidemia associated with type 2 diabetes mellitus (HCC)   4. Statin intolerance   5. Mild CAD   6. Iron deficiency anemia, unspecified iron deficiency anemia type    PLAN:    In order of problems listed above:  He currently is improved after TAVR but still has residual fatigue and weakness I suspect related to anemia recheck his CBC iron studies if his hemoglobin is less than 10 he likely will need iron replacement therapy parenterally.  In the interim he is started back on iron 5 grains every other day to avoid absorption blockade.  We will continue low-dose antiplatelet therapy aspirin He will continue to monitor home blood pressure he has a validated device good technique he is at target continue his antihypertensives including calcium channel blocker ACE inhibitor and diuretic Check lipid profile today if LDL is above 70 I will initiate bempedoic acid without statin intolerance of mild CAD   Next appointment: 6 months   Medication Adjustments/Labs and Tests Ordered: Current medicines are reviewed at length with the patient today.  Concerns regarding medicines are outlined above.  Orders Placed This Encounter  Procedures   CBC   Comp Met (CMET)   Lipid Profile   Iron Binding Cap (TIBC)([facility])   Ferritin   No orders of the defined types were placed in this encounter.   Chief Complaint  Patient presents with   Follow-up    After TAVR    History of Present Illness:    Tommy Green is a 76 y.o. male with a hx of Aortic stenosis with TAVR 01/26/2022 hypertensive heart and kidney disease stage III hyperlipidemia previous stroke and iron  deficiency anemia last seen 12 03/03/2022.  He then presented to the ED on 2/22 complaining of dizziness. Reported that he felt different than his previous history of vertigo. At that time, he was evaluated by the structural heart team with plan to pursue TAVR. He underwent R/LHC and CT imaging which showed moderate, nonobstructive coronary artery disease and anatomy suitable for transcatheter aortic valve replacement and underwent TAVR with 29 mm Edwards Sapien 3UR THV via the TF approach on 01/26/22. Post operative echo with normal LVEF and AVA by VTI measures 2.25 cm, mean gradient measures 11.8 mmHg and no PVL.   Compliance with diet, lifestyle and medications: Yes  He no longer takes iron He was unaware of the severity of his anemia we will check iron studies today as he appears to have ongoing pallor. He clearly is improved after TAVR but is still fatigued and weak not having shortness of breath edema orthopnea chest pain palpitation or syncope He checks home blood pressure which runs in the range of 120-130/70  He had an echocardiogram 10/21/2021.  Aortic valve was trileaflet he had severe aortic stenosis with a mean gradient of 45 mmHg valve area 0.5 cm.  Left ventricular ejection fraction was low normal 50 to 55% with mild LVH.  1. Vertical orientation on PS views. Left ventricular ejection fraction,  by estimation, is 50 to 55%. The left ventricle has low normal function.  The left ventricle has no regional wall motion abnormalities.  There is  mild concentric left ventricular  hypertrophy. Left ventricular diastolic parameters are consistent with  Grade I diastolic dysfunction (impaired relaxation).   2. Right ventricular systolic function is normal. The right ventricular  size is normal.   3. Left atrial size was moderately dilated.   4. The mitral valve is normal in structure. Trivial mitral valve  regurgitation. No evidence of mitral stenosis.   5. Normal SVI/Flow      . The  aortic valve is tricuspid. There is severe calcifcation of the  aortic valve. There is moderate thickening of the aortic valve. Aortic  valve regurgitation is not visualized. Severe aortic valve stenosis.   6. There is mild dilatation of the ascending aorta, measuring 37 mm.   7. The inferior vena cava is normal in size with greater than 50%  respiratory variability, suggesting right atrial pressure of 3 mmHg.   Comparison(s): LVEF 55-60%, Severe AS.  Past Medical History:  Diagnosis Date   Anemia    Essential hypertension 07/18/2017   Foot drop, left foot    due to back surgery in 01/2021   GERD (gastroesophageal reflux disease)    History of colonic polyps    History of hiatal hernia    many years ago   History of rheumatic fever    History of stroke 07/18/2017   Hypertriglyceridemia 07/18/2017   S/P TAVR (transcatheter aortic valve replacement) 01/26/2022   Edwards S3UR 77m via TF approach with Dr. [name] Dr. BCyndia Bent  Severe aortic stenosis    Type 2 diabetes mellitus with diabetic neuropathy, unspecified ([facility] 07/18/2017    Past Surgical History:  Procedure Laterality Date   COLONOSCOPY     around 2018 [facility]   COLONOSCOPY  09/02/2020   CRANIOTOMY Left 10/25/2019   Procedure: CRANIOTOMY HEMATOMA EVACUATION SUBDURAL;  Surgeon: [name] [name];  [facility]  Service: Neurosurgery;  Laterality: Left;   ESOPHAGOGASTRODUODENOSCOPY     around 2018 with [facility]   FOOT CAPSULOTOMY Left 07/25/2008   Mid Foot #2 MPJ   Hammertoe Repair Left 07/25/2008   #2 toe   INTRAOPERATIVE TRANSTHORACIC ECHOCARDIOGRAM N/A 01/26/2022   Procedure: INTRAOPERATIVE TRANSTHORACIC ECHOCARDIOGRAM;  Surgeon: TEarly Osmond MD;  [facility]  Service: Open Heart Surgery;  Laterality: N/A;   RIGHT/LEFT HEART CATH AND CORONARY ANGIOGRAPHY N/A 12/11/2021   Procedure: RIGHT/LEFT HEART CATH AND CORONARY ANGIOGRAPHY;  Surgeon: [name];  [facility];  Service: Cardiovascular;  Laterality: N/A;   SPINAL FUSION  01/2021   TARSAL TUNNEL RELEASE Left 07/25/2008   TONSILLECTOMY     removed as a child   TRANSCATHETER AORTIC VALVE REPLACEMENT, TRANSFEMORAL N/A 01/26/2022   Procedure: Transcatheter Aortic Valve Replacement 29MM, Transfemoral;  Surgeon: TEarly Osmond MD;  [facility]  Service: Open Heart Surgery;  Laterality: N/A;  Percutaneous   UPPER GASTROINTESTINAL ENDOSCOPY  09/02/2020    Current Medications: Current Meds  Medication Sig   acetaminophen (TYLENOL) 325 MG tablet Take 650 mg by mouth every 6 (six) hours as needed for mild pain or headache.   amLODipine (NORVASC) 5 MG tablet Take 1 tablet (5 mg total) by mouth daily.   amoxicillin (AMOXIL) 500 MG tablet Take 4 tablets (2,000 mg total) by mouth as directed. 1 HOUR PRIOR TO DENTAL APPOINTMENTS   aspirin 81 MG chewable tablet Chew 1 tablet (81 mg total) by mouth daily.   B Complex Vitamins (B COMPLEX PO) Take 1  tablet by mouth daily.   Cholecalciferol (VITAMIN D3 SUPER STRENGTH) 50 MCG (2000 UT) CAPS Take 2,000 Units by mouth daily.   escitalopram (LEXAPRO) 5 MG tablet Take 2 tablets (10 mg total) by mouth daily.   Etanercept (ENBREL MINI) 50 MG/ML SOCT Inject 50 mg into the skin once a week.   ezetimibe (ZETIA) 10 MG tablet Take 1 tablet (10 mg total) by mouth daily. For elevated cholesterol and triglycerides   fenofibrate micronized (LOFIBRA) 200 MG capsule TAKE 1 CAPSULE DAILY BEFOREBREAKFAST   fluticasone (FLONASE) 50 MCG/ACT nasal spray Place 2 sprays into both nostrils daily.   furosemide (LASIX) 80 MG tablet TAKE 1 TABLET BY MOUTH EVERY DAY   HYDROcodone-acetaminophen (NORCO) 7.5-325 MG tablet Take 1 tablet by mouth every 6 (six) hours as needed for moderate pain.   insulin aspart (NOVOLOG FLEXPEN) 100 UNIT/ML FlexPen Inject 0-10 Units into the skin See admin instructions. Inject 0-10 units into the skin before meals, PER SLIDING SCALE: BGL 0-200 =  give nothing, 201-250 = 2 units, 251-300 = 4 units, 301-350 = 6 units, 351-400 = 8 units, >400 = 10 units, push fluids, and repeat BGL check in 2 hours. If BGL remains >400 after 2 hours, CALL MD.   insulin degludec (TRESIBA) 100 UNIT/ML FlexTouch Pen Inject 30 Units into the skin at bedtime.   leflunomide (ARAVA) 10 MG tablet Take 1 tablet (10 mg total) by mouth daily.   lisinopril (ZESTRIL) 40 MG tablet Take 1 tablet (40 mg total) by mouth in the morning.   magnesium oxide (MAG-OX) 400 MG tablet Take 400 mg by mouth daily with lunch.   melatonin 5 MG TABS Take 5 mg by mouth at bedtime.   Multiple Vitamins-Minerals (MULTIVITAMIN WITH MINERALS) tablet Take 1 tablet by mouth daily with breakfast.   Omega 3 1000 MG CAPS Take 1,000 mg by mouth daily with lunch.   omeprazole (PRILOSEC) 20 MG capsule TAKE 1 CAPSULE DAILY BEFOREBREAKFAST   ONETOUCH VERIO test strip 4 (four) times daily.   Potassium 99 MG TABS Take 99 mg by mouth every evening.   triamcinolone (KENALOG) 0.1 % paste Use as directed 1 Application in the mouth or throat 2 (two) times daily.   triamcinolone cream (KENALOG) 0.1 % Apply 1 Application topically 2 (two) times daily.   vitamin B-12 (CYANOCOBALAMIN) 1000 MCG tablet Take 1,000 mcg by mouth daily.   vitamin E 180 MG (400 UNITS) capsule Take 400 Units by mouth daily.     Allergies:   Fluzone quadrivalent [influenza vac split quad], Influenza vaccines, Atorvastatin, Statins, Aspirin, and Canagliflozin   Social History   Socioeconomic History   Marital status: Married    Spouse name: Not on file   Number of children: 2   Years of education: Not on file   Highest education level: Not on file  Occupational History   Not on file  Tobacco Use   Smoking status: Former    Packs/day: 1.00    Years: 10.00    Total pack years: 10.00    Types: Cigarettes    Quit date: 39    Years since quitting: 50.7    Passive exposure: Past   Smokeless tobacco: Never  Vaping Use   Vaping  Use: Never used  Substance and Sexual Activity   Alcohol use: Not Currently   Drug use: No   Sexual activity: Yes  Other Topics Concern   Not on file  Social History Narrative   Not on file   Social  Determinants of Health   Financial Resource Strain: Low Risk  (06/22/2022)   Overall Financial Resource Strain (CARDIA)    Difficulty of Paying Living Expenses: Not hard at all  Food Insecurity: No Food Insecurity (06/15/2022)   Hunger Vital Sign    Worried About Running Out of Food in the Last Year: Never true    Ran Out of Food in the Last Year: Never true  Transportation Needs: No Transportation Needs (06/15/2022)   PRAPARE - Hydrologist (Medical): No    Lack of Transportation (Non-Medical): No  Physical Activity: Insufficiently Active (11/25/2021)   Exercise Vital Sign    Days of Exercise per Week: 2 days    Minutes of Exercise per Session: 30 min  Stress: Not on file  Social Connections: Not on file     Family History: The patient's family history includes Alcoholism in his brother; Colon polyps in his mother; Healthy in his son and son; Hyperlipidemia in his father and mother. There is no history of Cancer, Colon cancer, Esophageal cancer, Rectal cancer, or Stomach cancer. ROS:   Please see the history of present illness.    All other systems reviewed and are negative.  EKGs/Labs/Other Studies Reviewed:    The following studies were reviewed today: TAVR OPERATIVE NOTE     Date of Procedure:                01/26/2022   Preoperative Diagnosis:      Severe Aortic Stenosis    Postoperative Diagnosis:    Same    Procedure:        Transcatheter Aortic Valve Replacement - Percutaneous Right Transfemoral Approach             Edwards Sapien 3 Ultra THV (size 29 mm, model # 9600TFX, serial # [id number])              Co-Surgeons:                        Gaye Pollack, MD and Lenna Sciara, MD     Anesthesiologist:                  Annye Asa,  MD   Echocardiographer:              Jenkins Rouge, MD   Pre-operative Echo Findings: Severe aortic stenosis Normal left ventricular systolic function   Post-operative Echo Findings: No paravalvular leak Normal left ventricular systolic function     Left Heart Catheterization Findings: Left ventricular end-diastolic pressure of 18 mmHg   _____________   Echo 01/27/22:   1. Left ventricular ejection fraction, by estimation, is 60 to 65%. The  left ventricle has normal function. The left ventricle has no regional  wall motion abnormalities. There is mild concentric left ventricular  hypertrophy and severe basal septal  hypertrophy.   2. Right ventricular systolic function is normal. The right ventricular  size is normal. Tricuspid regurgitation signal is inadequate for assessing  PA pressure.   3. The mitral valve is normal in structure. Trivial mitral valve  regurgitation. No evidence of mitral stenosis. Moderate mitral annular  calcification.   4. The aortic valve has been repaired/replaced. Aortic valve  regurgitation is trivial. Trivial perivalvular AI at 12:00 on parasternal  short axis view.      No aortic stenosis is present. There is a 29 mm Sapien prosthetic  (TAVR) valve present in the aortic position. Procedure Date:  01/27/2022.  Aortic valve area, by VTI measures 2.25 cm. Aortic valve mean gradient  measures 11.8 mmHg. Aortic valve Vmax  measures 2.25 m/s.   5. The inferior vena cava is normal in size with greater than 50%  respiratory variability, suggesting right atrial pressure of 3 mmHg.   6. Aortic dilatation noted. There is borderline dilatation of the  ascending aorta, measuring 37 mm.   7. Compared to study dated 01/26/2022, there is trivial perivalvular AI.  The mean AVG has increased from 93mHg to 11.820mg.    ______________________   Echo 03/03/22 IMPRESSIONS  1. Left ventricular ejection fraction, by estimation, is 55 to 60%. Left  ventricular  ejection fraction by 3D volume is 59 %. The left ventricle has  normal function. The left ventricle has no regional wall motion  abnormalities. There is severe hypertrophy of the basal septal segment. The rest of the LV segments  demonstrate mild left ventricular hypertrophy. Left ventricular diastolic  parameters are consistent with Grade I diastolic dysfunction (impaired  relaxation).   2. Right ventricular systolic function is normal. The right ventricular  size is normal. Tricuspid regurgitation signal is inadequate for assessing  PA pressure.   3. The mitral valve is abnormal. Trivial mitral valve regurgitation.  Moderate mitral annular calcification.   4. The aortic valve has been repaired/replaced. There is a 29 mm Sapien  prosthetic (TAVR) valve present in the aortic position. Procedure Date:  01/26/22. Echo findings are consistent with normal structure and function  of the aortic valve prosthesis.  Aortic valve mean gradient measures 9.5 mmHg. Aortic valve Vmax measures  2.09 m/s. DI 0.49. No paravalvular leak visualized.   5. Aortic dilatation noted. There is borderline dilatation of the aortic  root, measuring 37 mm. There is borderline dilatation of the ascending  aorta, measuring 38 mm.   6. The inferior vena cava is normal in size with greater than 50%  respiratory variability, suggesting right atrial pressure of 3 mmHg.    Recent Labs: 12/21/2021: Magnesium 1.5 01/22/2022: ALT 17 02/03/2022: Hemoglobin 8.0; Platelets 296 03/30/2022: BUN 33; Creatinine, Ser 1.91; Potassium 4.4; Sodium 137  Recent Lipid Panel    Component Value Date/Time   CHOL 187 05/04/2021 0852   TRIG 315.0 (H) 05/04/2021 0852   HDL 28.10 (L) 05/04/2021 0852   CHOLHDL 7 05/04/2021 0852   VLDL 63.0 (H) 05/04/2021 0852   LDLCALC 79 03/06/2021 0413   LDLDIRECT 107.0 05/04/2021 0852    Physical Exam:    VS:  BP (!) 156/70 (BP Location: Left Arm, Patient Position: Sitting)   Pulse 88   Ht 5' 11" (1.803  m)   Wt 233 lb 1.9 oz (105.7 kg)   SpO2 97%   BMI 32.51 kg/m     Wt Readings from Last 3 Encounters:  06/29/22 233 lb 1.9 oz (105.7 kg)  06/16/22 234 lb (106.1 kg)  05/17/22 234 lb 8 oz (106.4 kg)     GEN:  Well nourished, well developed in no acute distress HEENT: Normal NECK: No JVD; No carotid bruits LYMPHATICS: No lymphadenopathy CARDIAC: No murmur RRR, no murmurs, rubs, gallops RESPIRATORY:  Clear to auscultation without rales, wheezing or rhonchi  ABDOMEN: Soft, non-tender, non-distended MUSCULOSKELETAL:  No edema; No deformity  SKIN: Warm and dry NEUROLOGIC:  Alert and oriented x 3 PSYCHIATRIC:  Normal affect    Signed, BrShirlee MoreMD  06/29/2022 2:47 PM    [place]

## 2022-06-29 ENCOUNTER — Ambulatory Visit: Payer: Medicare HMO | Attending: Cardiology | Admitting: Cardiology

## 2022-06-29 ENCOUNTER — Encounter: Payer: Self-pay | Admitting: Cardiology

## 2022-06-29 ENCOUNTER — Other Ambulatory Visit: Payer: Self-pay | Admitting: Family Medicine

## 2022-06-29 VITALS — BP 156/70 | HR 88 | Ht 71.0 in | Wt 233.1 lb

## 2022-06-29 DIAGNOSIS — Z952 Presence of prosthetic heart valve: Secondary | ICD-10-CM

## 2022-06-29 DIAGNOSIS — Z789 Other specified health status: Secondary | ICD-10-CM

## 2022-06-29 DIAGNOSIS — Z723 Lack of physical exercise: Secondary | ICD-10-CM | POA: Diagnosis not present

## 2022-06-29 DIAGNOSIS — E785 Hyperlipidemia, unspecified: Secondary | ICD-10-CM | POA: Diagnosis not present

## 2022-06-29 DIAGNOSIS — D509 Iron deficiency anemia, unspecified: Secondary | ICD-10-CM

## 2022-06-29 DIAGNOSIS — R2689 Other abnormalities of gait and mobility: Secondary | ICD-10-CM | POA: Diagnosis not present

## 2022-06-29 DIAGNOSIS — I10 Essential (primary) hypertension: Secondary | ICD-10-CM

## 2022-06-29 DIAGNOSIS — E1169 Type 2 diabetes mellitus with other specified complication: Secondary | ICD-10-CM | POA: Diagnosis not present

## 2022-06-29 DIAGNOSIS — I2510 Atherosclerotic heart disease of native coronary artery without angina pectoris: Secondary | ICD-10-CM

## 2022-06-29 DIAGNOSIS — M6281 Muscle weakness (generalized): Secondary | ICD-10-CM | POA: Diagnosis not present

## 2022-06-29 DIAGNOSIS — Z9181 History of falling: Secondary | ICD-10-CM | POA: Diagnosis not present

## 2022-06-29 NOTE — Patient Instructions (Signed)
Medication Instructions:  Your physician recommends that you continue on your current medications as directed. Please refer to the Current Medication list given to you today.  *If you need a refill on your cardiac medications before your next appointment, please call your pharmacy*   Lab Work: Your physician recommends that you return for lab work in:   Labs today in [place] [facility]: CBC, Iron TIBC, Ferritin, CMP, Lipids  If you have labs (blood work) drawn today and your tests are completely normal, you will receive your results only by: [place] (if you have [place]) OR A paper copy in the mail If you have any lab test that is abnormal or we need to change your treatment, we will call you to review the results.   Testing/Procedures: None   Follow-Up: At [facility], you and your health needs are our priority.  As part of our continuing mission to provide you with exceptional heart care, we have created designated Provider Care Teams.  These Care Teams include your primary Cardiologist (physician) and Advanced Practice Providers (APPs -  Physician Assistants and Nurse Practitioners) who all work together to provide you with the care you need, when you need it.  We recommend signing up for the patient portal called "MyChart".  Sign up information is provided on this After Visit Summary.  MyChart is used to connect with patients for Virtual Visits (Telemedicine).  Patients are able to view lab/test results, encounter notes, upcoming appointments, etc.  Non-urgent messages can be sent to your provider as well.   To learn more about what you can do with MyChart, go to [email or web address].    Your next appointment:   6 month(s)  The format for your next appointment:   In Person  Provider:   Shirlee More, MD    Other Instructions Check home blood pressures daily and record.  Resume iron supplement every other day.  Important Information About Sugar

## 2022-06-30 DIAGNOSIS — R2232 Localized swelling, mass and lump, left upper limb: Secondary | ICD-10-CM | POA: Diagnosis not present

## 2022-06-30 DIAGNOSIS — M79641 Pain in right hand: Secondary | ICD-10-CM | POA: Diagnosis not present

## 2022-06-30 DIAGNOSIS — M79642 Pain in left hand: Secondary | ICD-10-CM | POA: Diagnosis not present

## 2022-06-30 LAB — CBC
Hematocrit: 31.4 % — ABNORMAL LOW (ref 37.5–51.0)
Hemoglobin: 10 g/dL — ABNORMAL LOW (ref 13.0–17.7)
MCH: 25.7 pg — ABNORMAL LOW (ref 26.6–33.0)
MCHC: 31.8 g/dL (ref 31.5–35.7)
MCV: 81 fL (ref 79–97)
Platelets: 324 10*3/uL (ref 150–450)
RBC: 3.89 x10E6/uL — ABNORMAL LOW (ref 4.14–5.80)
RDW: 16.4 % — ABNORMAL HIGH (ref 11.6–15.4)
WBC: 9.1 10*3/uL (ref 3.4–10.8)

## 2022-06-30 LAB — LIPID PANEL
Chol/HDL Ratio: 7.2 ratio — ABNORMAL HIGH (ref 0.0–5.0)
Cholesterol, Total: 173 mg/dL (ref 100–199)
HDL: 24 mg/dL — ABNORMAL LOW (ref >39)
LDL Chol Calc (NIH): 78 mg/dL (ref 0–99)
Triglycerides: 446 mg/dL — ABNORMAL HIGH (ref 0–149)
VLDL Cholesterol Cal: 71 mg/dL — ABNORMAL HIGH (ref 5–40)

## 2022-06-30 LAB — COMPREHENSIVE METABOLIC PANEL
ALT: 11 IU/L (ref 0–44)
AST: 19 IU/L (ref 0–40)
Albumin/Globulin Ratio: 1.8 (ref 1.2–2.2)
Albumin: 4.2 g/dL (ref 3.8–4.8)
Alkaline Phosphatase: 55 IU/L (ref 44–121)
BUN/Creatinine Ratio: 16 (ref 10–24)
BUN: 29 mg/dL — ABNORMAL HIGH (ref 8–27)
Bilirubin Total: <0.2 mg/dL (ref 0.0–1.2)
CO2: 20 mmol/L (ref 20–29)
Calcium: 9.4 mg/dL (ref 8.6–10.2)
Chloride: 101 mmol/L (ref 96–106)
Creatinine, Ser: 1.77 mg/dL — ABNORMAL HIGH (ref 0.76–1.27)
Globulin, Total: 2.4 g/dL (ref 1.5–4.5)
Glucose: 103 mg/dL — ABNORMAL HIGH (ref 70–99)
Potassium: 5 mmol/L (ref 3.5–5.2)
Sodium: 137 mmol/L (ref 134–144)
Total Protein: 6.6 g/dL (ref 6.0–8.5)
eGFR: 39 mL/min/{1.73_m2} — ABNORMAL LOW (ref >59)

## 2022-06-30 LAB — IRON AND TIBC
Iron Saturation: 9 % — CL (ref 15–55)
Iron: 35 ug/dL — ABNORMAL LOW (ref 38–169)
Total Iron Binding Capacity: 376 ug/dL (ref 250–450)
UIBC: 341 ug/dL (ref 111–343)

## 2022-06-30 LAB — FERRITIN: Ferritin: 35 ng/mL (ref 30–400)

## 2022-07-01 ENCOUNTER — Other Ambulatory Visit: Payer: Self-pay | Admitting: Rheumatology

## 2022-07-01 ENCOUNTER — Other Ambulatory Visit: Payer: Self-pay

## 2022-07-01 DIAGNOSIS — Z723 Lack of physical exercise: Secondary | ICD-10-CM | POA: Diagnosis not present

## 2022-07-01 DIAGNOSIS — D509 Iron deficiency anemia, unspecified: Secondary | ICD-10-CM

## 2022-07-01 DIAGNOSIS — M6281 Muscle weakness (generalized): Secondary | ICD-10-CM | POA: Diagnosis not present

## 2022-07-01 DIAGNOSIS — R2689 Other abnormalities of gait and mobility: Secondary | ICD-10-CM | POA: Diagnosis not present

## 2022-07-01 DIAGNOSIS — Z9181 History of falling: Secondary | ICD-10-CM | POA: Diagnosis not present

## 2022-07-01 DIAGNOSIS — M0579 Rheumatoid arthritis with rheumatoid factor of multiple sites without organ or systems involvement: Secondary | ICD-10-CM

## 2022-07-01 MED ORDER — LEFLUNOMIDE 10 MG PO TABS: 10.0000 mg | ORAL_TABLET | Freq: Every day | ORAL | 0 refills | Status: DC

## 2022-07-01 NOTE — Telephone Encounter (Signed)
Patient called requesting prescription refill of Leflunomide to be sent to [facility].

## 2022-07-01 NOTE — Telephone Encounter (Signed)
Next Visit: 08/25/2022   Last Visit: 03/25/2022   Last Fill: 04/12/2022   DX: Rheumatoid arthritis involving multiple sites with positive rheumatoid factor    Current Dose per office note 03/25/2022: Arava 10 mg 1 tablet by mouth daily    Labs: 06/29/2022 Glucose 103, BUN 29, Creat. 1.77 GFR 39, RBC 3.89, Hgb 10.0, Hct 31.4, Mch 25.7, RDW 16.4   Okay to refill Arava?

## 2022-07-05 ENCOUNTER — Other Ambulatory Visit: Payer: Self-pay | Admitting: Family

## 2022-07-05 DIAGNOSIS — D649 Anemia, unspecified: Secondary | ICD-10-CM

## 2022-07-06 ENCOUNTER — Encounter: Payer: Self-pay | Admitting: Family

## 2022-07-06 ENCOUNTER — Inpatient Hospital Stay: Payer: Medicare HMO | Attending: Hematology & Oncology

## 2022-07-06 ENCOUNTER — Inpatient Hospital Stay: Payer: Medicare HMO | Admitting: Family

## 2022-07-06 ENCOUNTER — Other Ambulatory Visit: Payer: Self-pay

## 2022-07-06 ENCOUNTER — Telehealth: Payer: Self-pay | Admitting: Family Medicine

## 2022-07-06 ENCOUNTER — Telehealth: Payer: Self-pay | Admitting: *Deleted

## 2022-07-06 DIAGNOSIS — D649 Anemia, unspecified: Secondary | ICD-10-CM

## 2022-07-06 DIAGNOSIS — D509 Iron deficiency anemia, unspecified: Secondary | ICD-10-CM | POA: Insufficient documentation

## 2022-07-06 DIAGNOSIS — E1122 Type 2 diabetes mellitus with diabetic chronic kidney disease: Secondary | ICD-10-CM | POA: Insufficient documentation

## 2022-07-06 DIAGNOSIS — N183 Chronic kidney disease, stage 3 unspecified: Secondary | ICD-10-CM | POA: Diagnosis not present

## 2022-07-06 DIAGNOSIS — E1140 Type 2 diabetes mellitus with diabetic neuropathy, unspecified: Secondary | ICD-10-CM | POA: Diagnosis not present

## 2022-07-06 DIAGNOSIS — I129 Hypertensive chronic kidney disease with stage 1 through stage 4 chronic kidney disease, or unspecified chronic kidney disease: Secondary | ICD-10-CM | POA: Diagnosis not present

## 2022-07-06 DIAGNOSIS — Z87891 Personal history of nicotine dependence: Secondary | ICD-10-CM | POA: Insufficient documentation

## 2022-07-06 LAB — CBC WITH DIFFERENTIAL (CANCER CENTER ONLY)
Abs Immature Granulocytes: 0.11 10*3/uL — ABNORMAL HIGH (ref 0.00–0.07)
Basophils Absolute: 0.1 10*3/uL (ref 0.0–0.1)
Basophils Relative: 1 %
Eosinophils Absolute: 0.4 10*3/uL (ref 0.0–0.5)
Eosinophils Relative: 4 %
HCT: 32.4 % — ABNORMAL LOW (ref 39.0–52.0)
Hemoglobin: 10.1 g/dL — ABNORMAL LOW (ref 13.0–17.0)
Immature Granulocytes: 1 %
Lymphocytes Relative: 25 %
Lymphs Abs: 2.6 10*3/uL (ref 0.7–4.0)
MCH: 25.9 pg — ABNORMAL LOW (ref 26.0–34.0)
MCHC: 31.2 g/dL (ref 30.0–36.0)
MCV: 83.1 fL (ref 80.0–100.0)
Monocytes Absolute: 1 10*3/uL (ref 0.1–1.0)
Monocytes Relative: 10 %
Neutro Abs: 6 10*3/uL (ref 1.7–7.7)
Neutrophils Relative %: 59 %
Platelet Count: 292 10*3/uL (ref 150–400)
RBC: 3.9 MIL/uL — ABNORMAL LOW (ref 4.22–5.81)
RDW: 17 % — ABNORMAL HIGH (ref 11.5–15.5)
WBC Count: 10.1 10*3/uL (ref 4.0–10.5)
nRBC: 0 % (ref 0.0–0.2)

## 2022-07-06 LAB — CMP (CANCER CENTER ONLY)
ALT: 10 U/L (ref 0–44)
AST: 15 U/L (ref 15–41)
Albumin: 4.2 g/dL (ref 3.5–5.0)
Alkaline Phosphatase: 32 U/L — ABNORMAL LOW (ref 38–126)
Anion gap: 9 (ref 5–15)
BUN: 40 mg/dL — ABNORMAL HIGH (ref 8–23)
CO2: 25 mmol/L (ref 22–32)
Calcium: 9.9 mg/dL (ref 8.9–10.3)
Chloride: 105 mmol/L (ref 98–111)
Creatinine: 1.94 mg/dL — ABNORMAL HIGH (ref 0.61–1.24)
GFR, Estimated: 35 mL/min — ABNORMAL LOW (ref >60)
Glucose, Bld: 158 mg/dL — ABNORMAL HIGH (ref 70–99)
Potassium: 4.9 mmol/L (ref 3.5–5.1)
Sodium: 139 mmol/L (ref 135–145)
Total Bilirubin: 0.4 mg/dL (ref 0.3–1.2)
Total Protein: 7.1 g/dL (ref 6.5–8.1)

## 2022-07-06 LAB — RETICULOCYTES
Immature Retic Fract: 17.9 % — ABNORMAL HIGH (ref 2.3–15.9)
RBC.: 3.88 MIL/uL — ABNORMAL LOW (ref 4.22–5.81)
Retic Count, Absolute: 66.3 10*3/uL (ref 19.0–186.0)
Retic Ct Pct: 1.7 % (ref 0.4–3.1)

## 2022-07-06 LAB — LACTATE DEHYDROGENASE: LDH: 147 U/L (ref 98–192)

## 2022-07-06 MED ORDER — ESCITALOPRAM OXALATE 5 MG PO TABS: 10.0000 mg | ORAL_TABLET | Freq: Every day | ORAL | 0 refills | Status: DC

## 2022-07-06 NOTE — Telephone Encounter (Signed)
Per9/19/23 los - gave upcoming appointments - confirmed

## 2022-07-06 NOTE — Telephone Encounter (Signed)
Okay to refill ? States zero refills

## 2022-07-06 NOTE — Telephone Encounter (Signed)
Pt's wife stated the rx is working well but he is now needing a refill.   Medication: escitalopram (LEXAPRO) 5 MG tablet  Has the patient contacted their pharmacy? No.  Preferred Pharmacy  [facility] [place] [facility] [address]   [place], [place] [address]  Phone:  [phone]  Fax:  [phone]

## 2022-07-06 NOTE — Telephone Encounter (Signed)
Rx sent 

## 2022-07-06 NOTE — Progress Notes (Signed)
Hematology/Oncology Consultation   Name: Tommy Green      MRN: [id number]    Location: Room/bed info not found  Date: 07/06/2022 Time:8:50 AM   REFERRING PHYSICIAN: Shirlee More, MD  REASON FOR CONSULT: Iron deficiency anemia    DIAGNOSIS: Iron deficiency anemia   HISTORY OF PRESENT ILLNESS: Mr. Blitch is a very pleasant 76 yo caucasian gentleman with history of iron deficiency anemia. He states that he also had this as a teenager and took oral iron daily.  Iron saturation last week was only 9% and ferritin 35. Hgb today is 10.1, MCV 83, platelets 292 and WBC count 10.1.  He has not noted any obvious blood loss. No bruising or petechiae.  He is symptomatic with fatigue, weakness, SOB with any exertion. Lower extremity swelling.  No known familial history of anemia.  He is diabetic and has history of stage III chronic kidney disease.  No history of thyroid disease.  He had TAVR in April 2023. He was noted to have IDA again in May 2023.  His colonoscopy was last done in November 2021. He had 2 benign polyps removed and was noted to have diverticulosis in the sigmoid colon as well as non-bleeding internal hemorrhoids.  No fever, chills, n/v, cough, dizziness, chest pain, palpitations, abdominal pain or changes in bowel or bladder habits.  He has psoriasis on his lower legs and uses Kenalog cream BID.  He states that he is scheduled to have an MRI of his hands to evaluate nodules present on the joints of his fingers. He is hoping to have them removed.  He developed drop foot in the left foot after having back surgery in 2022.  He has neuropathy in both feet.  He ambulates with a walker at home for support. No falls or syncope to report.  No smoking, ETOH or recreational drug use.  Appetite is good and he is staying well hydrated throughout the day. His weight is 234 lbs.  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   ROS: All other 10 point  review of systems is negative.   PAST MEDICAL HISTORY:   Past Medical History:  Diagnosis Date   Anemia    Essential hypertension 07/18/2017   Foot drop, left foot    due to back surgery in 01/2021   GERD (gastroesophageal reflux disease)    History of colonic polyps    History of hiatal hernia    many years ago   History of rheumatic fever    History of stroke 07/18/2017   Hypertriglyceridemia 07/18/2017   S/P TAVR (transcatheter aortic valve replacement) 01/26/2022   [name] 16m via TF approach with Dr. [name] Dr. BCyndia Bent  Severe aortic stenosis    Type 2 diabetes mellitus with diabetic neuropathy, unspecified ([facility] 07/18/2017    ALLERGIES: Allergies  Allergen Reactions   Fluzone Quadrivalent [Influenza Vac Split Quad] Other (See Comments)    "Got the flu"   Influenza Vaccines Other (See Comments)    "Got the flu"   Atorvastatin Other (See Comments)    Myalgias, GI upset, and "allergic," per [facility]  Statins Other (See Comments)    Muscle pain- "allergic," per [facility]   Aspirin Other (See Comments)    Gastroesophageal reflux and "allergic," per [facility]    Canagliflozin Other (See Comments)    Pt reports that the use of Invokana caused acute KIDNEY FAILURE ([facility] records) and "allergic," [facility]  MEDICATIONS:  Current Outpatient Medications on File Prior to Visit  Medication Sig Dispense Refill   acetaminophen (TYLENOL) 325 MG tablet Take 650 mg by mouth every 6 (six) hours as needed for mild pain or headache.     amLODipine (NORVASC) 5 MG tablet Take 1 tablet (5 mg total) by mouth daily. 90 tablet 3   amoxicillin (AMOXIL) 500 MG tablet Take 4 tablets (2,000 mg total) by mouth as directed. 1 HOUR PRIOR TO DENTAL APPOINTMENTS 12 tablet 6   aspirin 81 MG chewable tablet Chew 1 tablet (81 mg total) by mouth daily. 90 tablet    B Complex Vitamins (B COMPLEX PO) Take 1 tablet by mouth daily.     Cholecalciferol (VITAMIN D3 SUPER STRENGTH) 50 MCG (2000 UT) CAPS Take  2,000 Units by mouth daily.     escitalopram (LEXAPRO) 5 MG tablet Take 2 tablets (10 mg total) by mouth daily. 90 tablet 0   Etanercept (ENBREL MINI) 50 MG/ML SOCT Inject 50 mg into the skin once a week. 12 mL 0   ezetimibe (ZETIA) 10 MG tablet Take 1 tablet (10 mg total) by mouth daily. For elevated cholesterol and triglycerides 90 tablet 3   fenofibrate micronized (LOFIBRA) 200 MG capsule TAKE 1 CAPSULE DAILY BEFOREBREAKFAST 90 capsule 3   fluticasone (FLONASE) 50 MCG/ACT nasal spray Place 2 sprays into both nostrils daily. 16 g 6   furosemide (LASIX) 80 MG tablet TAKE 1 TABLET BY MOUTH EVERY DAY 90 tablet 1   HYDROcodone-acetaminophen (NORCO) 7.5-325 MG tablet Take 1 tablet by mouth every 6 (six) hours as needed for moderate pain. 30 tablet 0   insulin aspart (NOVOLOG FLEXPEN) 100 UNIT/ML FlexPen Inject 0-10 Units into the skin See admin instructions. Inject 0-10 units into the skin before meals, PER SLIDING SCALE: BGL 0-200 = give nothing, 201-250 = 2 units, 251-300 = 4 units, 301-350 = 6 units, 351-400 = 8 units, >400 = 10 units, push fluids, and repeat BGL check in 2 hours. If BGL remains >400 after 2 hours, CALL MD.     insulin degludec (TRESIBA) 100 UNIT/ML FlexTouch Pen Inject 30 Units into the skin at bedtime.     leflunomide (ARAVA) 10 MG tablet Take 1 tablet (10 mg total) by mouth daily. 90 tablet 0   lisinopril (ZESTRIL) 40 MG tablet Take 1 tablet (40 mg total) by mouth in the morning. 90 tablet 3   magnesium oxide (MAG-OX) 400 MG tablet Take 400 mg by mouth daily with lunch.     melatonin 5 MG TABS Take 5 mg by mouth at bedtime.     Multiple Vitamins-Minerals (MULTIVITAMIN WITH MINERALS) tablet Take 1 tablet by mouth daily with breakfast.     Omega 3 1000 MG CAPS Take 1,000 mg by mouth daily with lunch.     omeprazole (PRILOSEC) 20 MG capsule TAKE 1 CAPSULE DAILY BEFOREBREAKFAST 90 capsule 3   ONETOUCH VERIO test strip 4 (four) times daily.     Potassium 99 MG TABS Take 99 mg by  mouth every evening.     triamcinolone (KENALOG) 0.1 % paste Use as directed 1 Application in the mouth or throat 2 (two) times daily. 5 g 0   triamcinolone cream (KENALOG) 0.1 % Apply 1 Application topically 2 (two) times daily. 30 g 0   vitamin B-12 (CYANOCOBALAMIN) 1000 MCG tablet Take 1,000 mcg by mouth daily.     vitamin E 180 MG (400 UNITS) capsule Take 400 Units by mouth daily.  No current facility-administered medications on file prior to visit.     PAST SURGICAL HISTORY Past Surgical History:  Procedure Laterality Date   COLONOSCOPY     around 2018 [facility]   COLONOSCOPY  09/02/2020   CRANIOTOMY Left 10/25/2019   Procedure: CRANIOTOMY HEMATOMA EVACUATION SUBDURAL;  Surgeon: Vallarie Mare, MD;  Location: [facility];  Service: Neurosurgery;  Laterality: Left;   ESOPHAGOGASTRODUODENOSCOPY     around 2018 with [facility]   FOOT CAPSULOTOMY Left 07/25/2008   Mid Foot #2 MPJ   Hammertoe Repair Left 07/25/2008   #2 toe   INTRAOPERATIVE TRANSTHORACIC ECHOCARDIOGRAM N/A 01/26/2022   Procedure: INTRAOPERATIVE TRANSTHORACIC ECHOCARDIOGRAM;  Surgeon: Early Osmond, MD;  Location: [facility];  Service: Open Heart Surgery;  Laterality: N/A;   RIGHT/LEFT HEART CATH AND CORONARY ANGIOGRAPHY N/A 12/11/2021   Procedure: RIGHT/LEFT HEART CATH AND CORONARY ANGIOGRAPHY;  Surgeon: Jettie Booze, MD;  Location: [facility];  Service: Cardiovascular;  Laterality: N/A;   SPINAL FUSION  01/2021   TARSAL TUNNEL RELEASE Left 07/25/2008   TONSILLECTOMY     removed as a child   TRANSCATHETER AORTIC VALVE REPLACEMENT, TRANSFEMORAL N/A 01/26/2022   Procedure: Transcatheter Aortic Valve Replacement 29MM, Transfemoral;  Surgeon: Early Osmond, MD;  Location: [facility];  Service: Open Heart Surgery;  Laterality: N/A;  Percutaneous   UPPER GASTROINTESTINAL ENDOSCOPY  09/02/2020    FAMILY HISTORY: Family History  Problem Relation Age of Onset   Hyperlipidemia Mother    Colon polyps  Mother    Hyperlipidemia Father    Alcoholism Brother    Healthy Son    Healthy Son    Cancer Neg Hx    Colon cancer Neg Hx    Esophageal cancer Neg Hx    Rectal cancer Neg Hx    Stomach cancer Neg Hx     SOCIAL HISTORY:  reports that he quit smoking about 50 years ago. His smoking use included cigarettes. He has a 10.00 pack-year smoking history. He has been exposed to tobacco smoke. He has never used smokeless tobacco. He reports that he does not currently use alcohol. He reports that he does not use drugs.  PERFORMANCE STATUS: The patient's performance status is 2 - Symptomatic, <50% confined to bed  PHYSICAL EXAM: Most Recent Vital Signs: There were no vitals taken for this visit. BP 135/62 (BP Location: Right Arm, Patient Position: Sitting)   Pulse 63   Temp 98 F (36.7 C) (Oral)   Resp 18   Ht '5\' 11"'$  (1.803 m)   Wt 234 lb 1.6 oz (106.2 kg)   SpO2 98%   BMI 32.65 kg/m   General Appearance:    Alert, cooperative, no distress, appears stated age  Head:    Normocephalic, without obvious abnormality, atraumatic  Eyes:    PERRL, conjunctiva/corneas clear, EOM's intact, fundi    benign, both eyes             Throat:   Lips, mucosa, and tongue normal; teeth and gums normal  Neck:   Supple, symmetrical, trachea midline, no adenopathy;       thyroid:  No enlargement/tenderness/nodules; no carotid   bruit or JVD  Back:     Symmetric, no curvature, ROM normal, no CVA tenderness  Lungs:     Clear to auscultation bilaterally, respirations unlabored  Chest wall:    No tenderness or deformity  Heart:    Regular rate and rhythm, S1 and S2 normal, no murmur, rub  or gallop  Abdomen:     Soft, non-tender, bowel sounds active all four quadrants,    no masses, no organomegaly        Extremities:   Extremities normal, atraumatic, no cyanosis or edema  Pulses:   2+ and symmetric all extremities  Skin:   Skin color, texture, turgor normal, no rashes or lesions  Lymph nodes:    Cervical, supraclavicular, and axillary nodes normal  Neurologic:   CNII-XII intact. Normal strength, sensation and reflexes      throughout    LABORATORY DATA:  Results for orders placed or performed in visit on 07/06/22 (from the past 48 hour(s))  CBC with Differential ([facility] Only)     Status: Abnormal   Collection Time: 07/06/22  8:29 AM  Result Value Ref Range   WBC Count 10.1 4.0 - 10.5 K/uL   RBC 3.90 (L) 4.22 - 5.81 MIL/uL   Hemoglobin 10.1 (L) 13.0 - 17.0 g/dL   HCT 32.4 (L) 39.0 - 52.0 %   MCV 83.1 80.0 - 100.0 fL   MCH 25.9 (L) 26.0 - 34.0 pg   MCHC 31.2 30.0 - 36.0 g/dL   RDW 17.0 (H) 11.5 - 15.5 %   Platelet Count 292 150 - 400 K/uL   nRBC 0.0 0.0 - 0.2 %   Neutrophils Relative % 59 %   Neutro Abs 6.0 1.7 - 7.7 K/uL   Lymphocytes Relative 25 %   Lymphs Abs 2.6 0.7 - 4.0 K/uL   Monocytes Relative 10 %   Monocytes Absolute 1.0 0.1 - 1.0 K/uL   Eosinophils Relative 4 %   Eosinophils Absolute 0.4 0.0 - 0.5 K/uL   Basophils Relative 1 %   Basophils Absolute 0.1 0.0 - 0.1 K/uL   Immature Granulocytes 1 %   Abs Immature Granulocytes 0.11 (H) 0.00 - 0.07 K/uL    Comment: Performed at [facility] at [facility], [address] [place], [place] [address]  Reticulocytes     Status: Abnormal   Collection Time: 07/06/22  8:30 AM  Result Value Ref Range   Retic Ct Pct 1.7 0.4 - 3.1 %   RBC. 3.88 (L) 4.22 - 5.81 MIL/uL   Retic Count, Absolute 66.3 19.0 - 186.0 K/uL   Immature Retic Fract 17.9 (H) 2.3 - 15.9 %    Comment: Performed at [facility] at [facility], [address] [place], [place] [address]      RADIOGRAPHY: No results found.     PATHOLOGY: None  ASSESSMENT/PLAN: Mr. Spurgeon is a very pleasant 76 yo caucasian gentleman with history of iron deficiency anemia.  We will get him set up for IV iron starting this week.  Epo level pending.  Follow-up in 6 weeks.   All questions were answered. The  patient knows to call the clinic with any problems, questions or concerns. We can certainly see the patient much sooner if necessary.   Lottie Dawson, NP

## 2022-07-08 LAB — ERYTHROPOIETIN: Erythropoietin: 7.2 m[IU]/mL (ref 2.6–18.5)

## 2022-07-10 DIAGNOSIS — M79642 Pain in left hand: Secondary | ICD-10-CM | POA: Diagnosis not present

## 2022-07-13 ENCOUNTER — Telehealth: Payer: Self-pay | Admitting: Family Medicine

## 2022-07-13 MED ORDER — ESCITALOPRAM OXALATE 5 MG PO TABS: 10.0000 mg | ORAL_TABLET | Freq: Every day | ORAL | 0 refills | Status: DC

## 2022-07-13 NOTE — Telephone Encounter (Signed)
They have only 10 left of the lexapro.  Resent the #90 day for lexapro

## 2022-07-13 NOTE — Telephone Encounter (Signed)
Patient's wife, Fraser Din, requesting call back from Shirlean Mylar regarding one of patient's prescriptions. She can be reached at [phone]

## 2022-07-14 ENCOUNTER — Inpatient Hospital Stay: Payer: Medicare HMO

## 2022-07-14 ENCOUNTER — Other Ambulatory Visit: Payer: Self-pay

## 2022-07-14 VITALS — BP 169/82 | HR 94 | Temp 98.2°F | Resp 18

## 2022-07-14 DIAGNOSIS — N183 Chronic kidney disease, stage 3 unspecified: Secondary | ICD-10-CM | POA: Diagnosis not present

## 2022-07-14 DIAGNOSIS — I129 Hypertensive chronic kidney disease with stage 1 through stage 4 chronic kidney disease, or unspecified chronic kidney disease: Secondary | ICD-10-CM | POA: Diagnosis not present

## 2022-07-14 DIAGNOSIS — E1122 Type 2 diabetes mellitus with diabetic chronic kidney disease: Secondary | ICD-10-CM | POA: Diagnosis not present

## 2022-07-14 DIAGNOSIS — D509 Iron deficiency anemia, unspecified: Secondary | ICD-10-CM | POA: Diagnosis not present

## 2022-07-14 DIAGNOSIS — Z87891 Personal history of nicotine dependence: Secondary | ICD-10-CM | POA: Diagnosis not present

## 2022-07-14 DIAGNOSIS — E1140 Type 2 diabetes mellitus with diabetic neuropathy, unspecified: Secondary | ICD-10-CM | POA: Diagnosis not present

## 2022-07-14 MED ORDER — SODIUM CHLORIDE 0.9 % IV SOLN
300.0000 mg | Freq: Once | INTRAVENOUS | Status: AC
  Administered 2022-07-14: 300 mg via INTRAVENOUS
  Filled 2022-07-14: qty 300

## 2022-07-14 MED ORDER — SODIUM CHLORIDE 0.9% FLUSH: 10.0000 mL | Freq: Once | INTRAVENOUS | Status: DC | PRN

## 2022-07-14 MED ORDER — SODIUM CHLORIDE 0.9 % IV SOLN: Freq: Once | INTRAVENOUS | Status: AC

## 2022-07-14 MED ORDER — SODIUM CHLORIDE 0.9% FLUSH: 3.0000 mL | Freq: Once | INTRAVENOUS | Status: DC | PRN

## 2022-07-14 NOTE — Patient Instructions (Signed)

## 2022-07-17 ENCOUNTER — Other Ambulatory Visit: Payer: Self-pay | Admitting: Family Medicine

## 2022-07-17 DIAGNOSIS — I129 Hypertensive chronic kidney disease with stage 1 through stage 4 chronic kidney disease, or unspecified chronic kidney disease: Secondary | ICD-10-CM

## 2022-07-17 DIAGNOSIS — Z794 Long term (current) use of insulin: Secondary | ICD-10-CM | POA: Diagnosis not present

## 2022-07-17 DIAGNOSIS — E1122 Type 2 diabetes mellitus with diabetic chronic kidney disease: Secondary | ICD-10-CM | POA: Diagnosis not present

## 2022-07-17 DIAGNOSIS — N1831 Chronic kidney disease, stage 3a: Secondary | ICD-10-CM | POA: Diagnosis not present

## 2022-07-17 DIAGNOSIS — E782 Mixed hyperlipidemia: Secondary | ICD-10-CM | POA: Diagnosis not present

## 2022-07-19 ENCOUNTER — Other Ambulatory Visit: Payer: Self-pay | Admitting: Family Medicine

## 2022-07-19 DIAGNOSIS — R2232 Localized swelling, mass and lump, left upper limb: Secondary | ICD-10-CM | POA: Diagnosis not present

## 2022-07-19 MED ORDER — ESCITALOPRAM OXALATE 10 MG PO TABS: 10.0000 mg | ORAL_TABLET | Freq: Every day | ORAL | 5 refills | Status: DC

## 2022-07-20 ENCOUNTER — Telehealth: Payer: Self-pay | Admitting: Family Medicine

## 2022-07-20 NOTE — Telephone Encounter (Signed)
Patient's wife states [facility] is not wanting to cover two 5-mg tablets, but they might cover a 10-mg table. She would like a new prescription sent. Please advise.

## 2022-07-20 NOTE — Telephone Encounter (Signed)
The Lexapro? This was done yesterday. Otherwise I am not sure what medication she is referring to.

## 2022-07-20 NOTE — Telephone Encounter (Signed)
Patient's wife informed

## 2022-07-21 ENCOUNTER — Inpatient Hospital Stay: Payer: Medicare HMO | Attending: Hematology & Oncology

## 2022-07-21 VITALS — BP 182/87 | HR 88 | Temp 98.2°F | Resp 18

## 2022-07-21 DIAGNOSIS — Z79899 Other long term (current) drug therapy: Secondary | ICD-10-CM | POA: Insufficient documentation

## 2022-07-21 DIAGNOSIS — D509 Iron deficiency anemia, unspecified: Secondary | ICD-10-CM | POA: Diagnosis not present

## 2022-07-21 MED ORDER — SODIUM CHLORIDE 0.9 % IV SOLN
300.0000 mg | Freq: Once | INTRAVENOUS | Status: AC
  Administered 2022-07-21: 300 mg via INTRAVENOUS
  Filled 2022-07-21: qty 300

## 2022-07-21 MED ORDER — SODIUM CHLORIDE 0.9 % IV SOLN: Freq: Once | INTRAVENOUS | Status: AC

## 2022-07-21 NOTE — Patient Instructions (Signed)

## 2022-07-28 ENCOUNTER — Inpatient Hospital Stay: Payer: Medicare HMO

## 2022-07-28 VITALS — BP 158/63 | HR 78 | Temp 98.3°F | Resp 18

## 2022-07-28 DIAGNOSIS — Z79899 Other long term (current) drug therapy: Secondary | ICD-10-CM | POA: Diagnosis not present

## 2022-07-28 DIAGNOSIS — D509 Iron deficiency anemia, unspecified: Secondary | ICD-10-CM

## 2022-07-28 MED ORDER — SODIUM CHLORIDE 0.9 % IV SOLN
300.0000 mg | Freq: Once | INTRAVENOUS | Status: AC
  Administered 2022-07-28: 300 mg via INTRAVENOUS
  Filled 2022-07-28: qty 300

## 2022-07-28 MED ORDER — SODIUM CHLORIDE 0.9 % IV SOLN: Freq: Once | INTRAVENOUS | Status: AC

## 2022-07-28 NOTE — Patient Instructions (Signed)

## 2022-08-01 ENCOUNTER — Other Ambulatory Visit: Payer: Self-pay | Admitting: Physician Assistant

## 2022-08-01 DIAGNOSIS — M0579 Rheumatoid arthritis with rheumatoid factor of multiple sites without organ or systems involvement: Secondary | ICD-10-CM

## 2022-08-04 ENCOUNTER — Other Ambulatory Visit: Payer: Self-pay | Admitting: *Deleted

## 2022-08-04 ENCOUNTER — Telehealth: Payer: Self-pay | Admitting: Family Medicine

## 2022-08-04 ENCOUNTER — Telehealth: Payer: Self-pay | Admitting: Rheumatology

## 2022-08-04 MED ORDER — ENBREL MINI 50 MG/ML ~~LOC~~ SOCT: 50.0000 mg | SUBCUTANEOUS | 0 refills | Status: DC

## 2022-08-04 MED ORDER — HYDROCODONE-ACETAMINOPHEN 7.5-325 MG PO TABS: 1.0000 | ORAL_TABLET | Freq: Four times a day (QID) | ORAL | 0 refills | Status: DC | PRN

## 2022-08-04 NOTE — Telephone Encounter (Signed)
[name] called back stating Moritz will come to our office on Tuesday, 08/10/22 around 1:30 pm for the TB gold test.

## 2022-08-04 NOTE — Telephone Encounter (Signed)
Refill request received via fax from [facility] for Enbrel Mini.  Next Visit: 08/25/2022  Last Visit: 03/25/2022  Last Fill: 04/26/2022  OK:[id number] arthritis involving multiple sites with positive rheumatoid factor   Current Dose per office note 03/25/2022: Enbrel mini 50 mg sq injections once weekly  Labs: 07/06/2022 Glucose 158, BUN 40, Creat. 1.94, GFR 35, Alk. Phos 32, RBC 3.90, Hgb 10.1, Hct 32.4, MCH 25.9, RDW 17.0, Abs Immature Granulocytes 0.11  TB Gold: 06/16/2021 Neg    Left message to advise patient he is due to update labs. (TB Gold, future order in place.   Okay to refill Enbrel Mini?

## 2022-08-04 NOTE — Telephone Encounter (Signed)
Medication: HYDROcodone-acetaminophen (NORCO) 7.5-325 MG tablet    Preferred Pharmacy (with phone number or street name): [id number] [place], [place] - [address] [place] [address]Phone: [phone] Fax: [phone]  Agent: Please be advised that RX refills may take up to 3 business days. We ask that you follow-up with your pharmacy.

## 2022-08-04 NOTE — Telephone Encounter (Signed)
Last RF #30 --- 06/16/22.

## 2022-08-04 NOTE — Telephone Encounter (Signed)
Patient's wife [name] called stating Giancarlos is scheduled to have labwork at the [facility] on 08/17/22 and is asking if the TB gold test can be done at that appointment.

## 2022-08-10 ENCOUNTER — Other Ambulatory Visit: Payer: Self-pay | Admitting: *Deleted

## 2022-08-10 DIAGNOSIS — Z79899 Other long term (current) drug therapy: Secondary | ICD-10-CM

## 2022-08-13 NOTE — Progress Notes (Unsigned)
Office Visit Note  Patient: Tommy Green             Date of Birth: [birth year]           MRN: [id number]             PCP: [name], DO Referring: [name]* Visit Date: 08/25/2022 Occupation: '@GUAROCC'$ @  Subjective:  Medication monitoring   History of Present Illness: Tommy Green is a 76 y.o. male with history of seropositive rheumatoid arthritis, osteoarthritis, and DDD.  Patient remains on Arava 10 mg 1 tablet by mouth daily and Enbrel mini 50 mg sq injections once weekly.  He is tolerating combination therapy without any side effects.  He has not missed any doses recently.  He denies any signs or symptoms of a rheumatoid arthritis flare.  He has occasional discomfort in his lower back especially first thing in the morning but his symptoms improved throughout the day.  He continues to use a rollator walker to assist with ambulation.  He has chronic pain in the left knee joint but denies any joint warmth or swelling. He continues to take hydrocodone as needed for pain relief. He denies any recent or recurrent infections.  He is planning on getting his annual flu shot next week at his PCPs office.    Activities of Daily Living:  Patient reports morning stiffness for a few minutes.   Patient Denies nocturnal pain.  Difficulty dressing/grooming: Reports Difficulty climbing stairs: Reports Difficulty getting out of chair: Denies Difficulty using hands for taps, buttons, cutlery, and/or writing: Reports  Review of Systems  Constitutional:  Positive for fatigue.  HENT:  Positive for mouth dryness. Negative for mouth sores.   Eyes:  Negative for dryness.  Respiratory:  Negative for shortness of breath.   Cardiovascular:  Negative for chest pain and palpitations.  Gastrointestinal:  Negative for blood in stool, constipation and diarrhea.  Endocrine: Negative for increased urination.  Genitourinary:  Negative for involuntary urination.   Musculoskeletal:  Positive for gait problem and morning stiffness. Negative for joint pain, joint pain, joint swelling, myalgias, muscle weakness, muscle tenderness and myalgias.  Skin:  Negative for color change, rash and sensitivity to sunlight.  Allergic/Immunologic: Negative for susceptible to infections.  Neurological:  Positive for headaches. Negative for dizziness.  Hematological:  Negative for swollen glands.  Psychiatric/Behavioral:  Negative for depressed mood and sleep disturbance. The patient is not nervous/anxious.     PMFS History:  Patient Active Problem List   Diagnosis Date Noted   IDA (iron deficiency anemia) 07/06/2022   Current mild episode of major depressive disorder without prior episode ([place]) 05/17/2022   Other chronic pain 03/05/2022   Bilateral lower extremity edema 03/05/2022   Severe aortic stenosis 01/26/2022   S/P TAVR (transcatheter aortic valve replacement) 01/26/2022   Microcytic anemia 12/10/2021   Obesity (BMI 30-39.9) 12/10/2021   Near syncope 12/09/2021   Aortic stenosis 12/09/2021   Rheumatoid arthritis ([place]) 12/09/2021   Foot drop, left foot 10/02/2021   History of colonic polyps 10/02/2021   History of rheumatic fever 10/02/2021   Stress fracture of left foot 09/03/2021   Gabapentin overdose, accidental or unintentional, initial encounter 03/08/2021   TIA (transient ischemic attack) 03/05/2021   Body mass index (BMI) 36.0-36.9, adult 10/03/2020   Subdural hemorrhage following injury without open intracranial wound and with prolonged loss of consciousness (more than 24 hours) without return to pre-existing conscious level ([place]) 10/03/2020   AKI (acute kidney injury) ([place])  Acute encephalopathy    SDH (subdural hematoma) (HCC) 99/35/7017   Cyclic vomiting syndrome 08/06/2019   Neuropathy 12/25/2018   AK (actinic keratosis) 08/24/2018   Neoplasm of uncertain behavior 79/39/0300   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   Tendinopathy of right  gluteus medius 07/13/2018   Tendinopathy of left gluteus medius 07/13/2018   Squamous cell carcinoma in situ (SCCIS) of skin 03/24/2018   Vitamin D deficiency 11/29/2017   Stage 3 chronic kidney disease ([place]) 10/28/2017   Primary osteoarthritis of right hip 09/11/2017   Essential hypertension 07/18/2017   Hyperlipidemia associated with type 2 diabetes mellitus ([place]) 07/18/2017   Type 2 diabetes mellitus with diabetic neuropathy, unspecified ([place]) 07/18/2017   Heart murmur 07/18/2017   Hypertriglyceridemia 07/18/2017   History of stroke 07/18/2017   DDD (degenerative disc disease), lumbar 06/15/2017   Iron deficiency anemia 06/15/2017   Stroke ([place]) 06/15/2017   Encounter for hepatitis C screening test for low risk patient 06/30/2016   Anemia 06/19/2016   Microalbuminuria due to type 2 diabetes mellitus ([place]) 03/19/2016   Hypertrophy of inferior nasal turbinate 12/20/2015   Disease of nasal cavity and sinuses 12/10/2015   Degenerative tear of acetabular labrum of left hip 11/04/2015   History of tobacco abuse 05/15/2015   Morbid (severe) obesity due to excess calories ([place]) 04/11/2015   Disorder of both eustachian tubes 01/27/2015   Maxillary sinusitis 01/13/2015   Lipoprotein deficiency 01/31/2014   Gastro-esophageal reflux disease without esophagitis 11/02/2013   Rheumatic fever 11/02/2013   Erectile dysfunction associated with type 2 diabetes mellitus ([place]) 07/31/2013   Type 2 diabetes mellitus with hyperglycemia, without long-term current use of insulin ([place]) 11/16/2012    Past Medical History:  Diagnosis Date   Anemia    Essential hypertension 07/18/2017   Foot drop, left foot    due to back surgery in 01/2021   GERD (gastroesophageal reflux disease)    History of colonic polyps    History of hiatal hernia    many years ago   History of rheumatic fever    History of stroke 07/18/2017   Hypertriglyceridemia 07/18/2017   S/P TAVR (transcatheter aortic valve replacement)  01/26/2022   [name] 48m via TF approach with Dr. [name] Dr. BCyndia Bent  Severe aortic stenosis    Type 2 diabetes mellitus with diabetic neuropathy, unspecified [place] 07/18/2017    Family History  Problem Relation Age of Onset   Hyperlipidemia Mother    Colon polyps Mother    Hyperlipidemia Father    Alcoholism Brother    Healthy Son    Healthy Son    Cancer Neg Hx    Colon cancer Neg Hx    Esophageal cancer Neg Hx    Rectal cancer Neg Hx    Stomach cancer Neg Hx    Past Surgical History:  Procedure Laterality Date   COLONOSCOPY     around 2018 [facility]   COLONOSCOPY  09/02/2020   CRANIOTOMY Left 10/25/2019   Procedure: CRANIOTOMY HEMATOMA EVACUATION SUBDURAL;  Surgeon: [name] [name];  [facility]  Service: Neurosurgery;  Laterality: Left;   ESOPHAGOGASTRODUODENOSCOPY     around 2018 with [facility]   FOOT CAPSULOTOMY Left 07/25/2008   Mid Foot #2 MPJ   Hammertoe Repair Left 07/25/2008   #2 toe   INTRAOPERATIVE TRANSTHORACIC ECHOCARDIOGRAM N/A 01/26/2022   Procedure: INTRAOPERATIVE TRANSTHORACIC ECHOCARDIOGRAM;  Surgeon: TEarly Osmond MD;  [facility]  Service: Open Heart Surgery;  Laterality: N/A;  RIGHT/LEFT HEART CATH AND CORONARY ANGIOGRAPHY N/A 12/11/2021   Procedure: RIGHT/LEFT HEART CATH AND CORONARY ANGIOGRAPHY;  Surgeon: Jettie Booze, MD;  Location: [facility];  Service: Cardiovascular;  Laterality: N/A;   SPINAL FUSION  01/2021   TARSAL TUNNEL RELEASE Left 07/25/2008   TONSILLECTOMY     removed as a child   TRANSCATHETER AORTIC VALVE REPLACEMENT, TRANSFEMORAL N/A 01/26/2022   Procedure: Transcatheter Aortic Valve Replacement 29MM, Transfemoral;  Surgeon: Early Osmond, MD;  Location: [facility];  Service: Open Heart Surgery;  Laterality: N/A;  Percutaneous   UPPER GASTROINTESTINAL ENDOSCOPY  09/02/2020   Social History   Social History Narrative   Not on file   Immunization History  Administered Date(s)  Administered   Fluad Quad(high Dose 65+) 08/31/2021   Influenza, High Dose Seasonal PF 07/31/2013   Influenza-Unspecified 07/31/2013, 04/17/2014   Moderna Sars-Covid-2 Vaccination 12/11/2019, 01/10/2020, 08/08/2020   Pneumococcal Conjugate-13 08/07/2015   Pneumococcal Polysaccharide-23 04/17/2013, 07/18/2013   Td 12/25/2018   Zoster, Live 04/17/2013     Objective: Vital Signs: BP (!) 163/74 (BP Location: Right Arm, Patient Position: Sitting, Cuff Size: Normal)   Pulse 66   Resp 18   Ht '5\' 11"'$  (1.803 m)   Wt 233 lb 9.6 oz (106 kg)   BMI 32.58 kg/m    Physical Exam Vitals and nursing note reviewed.  Constitutional:      Appearance: He is well-developed.  HENT:     Head: Normocephalic and atraumatic.  Eyes:     Conjunctiva/sclera: Conjunctivae normal.     Pupils: Pupils are equal, round, and reactive to light.  Cardiovascular:     Rate and Rhythm: Normal rate and regular rhythm.     Heart sounds: Murmur heard.  Pulmonary:     Effort: Pulmonary effort is normal.     Breath sounds: Normal breath sounds.  Abdominal:     General: Bowel sounds are normal.     Palpations: Abdomen is soft.  Musculoskeletal:     Cervical back: Normal range of motion and neck supple.  Skin:    General: Skin is warm and dry.     Capillary Refill: Capillary refill takes less than 2 seconds.  Neurological:     Mental Status: He is alert and oriented to person, place, and time.  Psychiatric:        Behavior: Behavior normal.      Musculoskeletal Exam: Patient remained seated during the examination today.  C-spine has slightly limited range of motion bilateral rotation.  Postural thoracic kyphosis noted.  Shoulder joints have good range of motion with no discomfort.  Right elbow joint contracture noted.  Small rheumatoid nodules noted over extensor surface of right elbow and several over both hands.  Complete fist formation bilaterally.  PIP and DIP thickening consistent with osteoarthritis of both  hands.  Hip joints difficult to assess in seated position.  Both knee joints have good range of motion with no warmth or effusion.  Pedal edema noted bilaterally.  CDAI Exam: CDAI Score: -- Patient Global: 5 mm; Provider Global: 5 mm Swollen: --; Tender: -- Joint Exam 08/25/2022   No joint exam has been documented for this visit   There is currently no information documented on the homunculus. Go to the Rheumatology activity and complete the homunculus joint exam.  Investigation: No additional findings.  Imaging: No results found.  Recent Labs: Lab Results  Component Value Date   WBC 8.7 08/17/2022   HGB 10.7 (L) 08/17/2022  PLT 227 08/17/2022   NA 137 08/17/2022   K 4.7 08/17/2022   CL 104 08/17/2022   CO2 24 08/17/2022   GLUCOSE 217 (H) 08/17/2022   BUN 40 (H) 08/17/2022   CREATININE 1.87 (H) 08/17/2022   BILITOT 0.3 08/17/2022   ALKPHOS 49 08/17/2022   AST 19 08/17/2022   ALT 15 08/17/2022   PROT 6.3 (L) 08/17/2022   ALBUMIN 4.1 08/17/2022   CALCIUM 8.9 08/17/2022   GFRAA 48 (L) 07/15/2020   QFTBGOLDPLUS NEGATIVE 08/10/2022    Speciality Comments: No specialty comments available.  Procedures:  No procedures performed Allergies: Canagliflozin, Aspirin, Atorvastatin, and Statins   Assessment / Plan:     Visit Diagnoses: Rheumatoid arthritis involving multiple sites with positive rheumatoid factor (HCC) - Positive RF, positive anti-CCP, elevated sedimentation rate, history of inflammatory arthritis: He has no synovitis on examination today.  He has not had any signs or symptoms of a rheumatoid arthritis flare.  He has clinically been doing well on Enbrel 50 mg subcutaneous injections every week and Arava 10 mg 1 tablet by mouth daily.  He is tolerating combination therapy without any side effects and has not missed any doses recently.  His morning stiffness has only been lasting a few minutes daily.  He has not had any nocturnal pain or difficulty with ADLs.  He  continues to use a rollator walker to assist with ambulation.  He will remain on Enbrel and Arava as combination therapy.  He was advised to notify us if he develops signs or symptoms of a flare.  He will follow-up in the office in 5 months or sooner if needed.  High risk medication use - Arava 10 mg 1 tablet by mouth daily and Enbrel mini 50 mg sq injections once weekly. CBC and CMP were drawn on 08/17/2022.  His next lab work will be due in January and every 3 months to monitor for drug toxicity. TB Gold negative on 08/10/2022. Discussed the importance of holding [place] and Enbrel if he develops signs or symptoms of infection and to resume once the infection has completely cleared. Discussed the importance of yearly skin cancer screening while on Enbrel.  Contracture of right elbow: Unchanged.  No tenderness or inflammation noted.  Primary osteoarthritis of both hips - Moderate to severe osteoarthritis of both hips.  He is not experiencing any increased groin pain at this time.  Hip joint range of motion was difficult to assess in seated position today.  Primary osteoarthritis of left knee: Chronic pain.  Good range of motion with some discomfort.  No warmth or effusion noted.  He is using a rollator walker to assist with ambulation.  Pain in left ankle and joints of left foot: He has occasional discomfort in his left ankle joint..  Pedal edema was noted in bilateral lower extremities.  DDD (degenerative disc disease), lumbar - He had L4-L5 fusion by Dr.Cohen.  Facet joint arthropathy.   He underwent a lumbar fusion in April 2022.  He experiences increased discomfort in his lower back first thing in the morning but typically his symptoms improve as the day goes on.  He is using a rollator walker to assist with ambulation.  Other medical conditions are listed as follows:  Essential hypertension: Blood pressure was 163/74 today in the office.  SDH (subdural hematoma) (HCC)  S/P TAVR  (transcatheter aortic valve replacement) - January 26, 2022  Hyperlipidemia associated with type 2 diabetes mellitus ([place])  Granuloma annulare  History of stroke  Type 2 diabetes mellitus with diabetic neuropathy, with long-term current use of insulin (HCC)  Squamous cell carcinoma in situ (SCCIS) of skin: Discussed the importance of yearly skin cancer screening by his dermatologist.  Orders: No orders of the defined types were placed in this encounter.  Meds ordered this encounter  Medications   Etanercept (ENBREL MINI) 50 MG/ML SOCT    Sig: Inject 50 mg into the skin once a week.    Dispense:  12 mL    Refill:  0      Follow-Up Instructions: Return in about 5 months (around 01/24/2023) for Rheumatoid arthritis, Osteoarthritis, DDD.   [name], PA-C  Note - This record has been created using Dragon software.  Chart creation errors have been sought, but may not always  have been located. Such creation errors do not reflect on  the standard of medical care.

## 2022-08-15 LAB — QUANTIFERON-TB GOLD PLUS
Mitogen-NIL: 8.87 IU/mL
NIL: 0.03 IU/mL
QuantiFERON-TB Gold Plus: NEGATIVE
TB1-NIL: <0 IU/mL
TB2-NIL: <0 IU/mL

## 2022-08-16 NOTE — Progress Notes (Signed)
TB gold is negative.

## 2022-08-17 ENCOUNTER — Inpatient Hospital Stay: Payer: Medicare HMO

## 2022-08-17 ENCOUNTER — Inpatient Hospital Stay: Payer: Medicare HMO | Admitting: Family

## 2022-08-17 ENCOUNTER — Encounter: Payer: Self-pay | Admitting: Family

## 2022-08-17 VITALS — BP 154/62 | HR 82 | Temp 98.0°F | Resp 18 | Wt 238.0 lb

## 2022-08-17 DIAGNOSIS — D509 Iron deficiency anemia, unspecified: Secondary | ICD-10-CM

## 2022-08-17 DIAGNOSIS — D631 Anemia in chronic kidney disease: Secondary | ICD-10-CM | POA: Diagnosis not present

## 2022-08-17 DIAGNOSIS — Z79899 Other long term (current) drug therapy: Secondary | ICD-10-CM | POA: Diagnosis not present

## 2022-08-17 LAB — CMP (CANCER CENTER ONLY)
ALT: 15 U/L (ref 0–44)
AST: 19 U/L (ref 15–41)
Albumin: 4.1 g/dL (ref 3.5–5.0)
Alkaline Phosphatase: 49 U/L (ref 38–126)
Anion gap: 9 (ref 5–15)
BUN: 40 mg/dL — ABNORMAL HIGH (ref 8–23)
CO2: 24 mmol/L (ref 22–32)
Calcium: 8.9 mg/dL (ref 8.9–10.3)
Chloride: 104 mmol/L (ref 98–111)
Creatinine: 1.87 mg/dL — ABNORMAL HIGH (ref 0.61–1.24)
GFR, Estimated: 36 mL/min — ABNORMAL LOW (ref >60)
Glucose, Bld: 217 mg/dL — ABNORMAL HIGH (ref 70–99)
Potassium: 4.7 mmol/L (ref 3.5–5.1)
Sodium: 137 mmol/L (ref 135–145)
Total Bilirubin: 0.3 mg/dL (ref 0.3–1.2)
Total Protein: 6.3 g/dL — ABNORMAL LOW (ref 6.5–8.1)

## 2022-08-17 LAB — CBC WITH DIFFERENTIAL (CANCER CENTER ONLY)
Abs Immature Granulocytes: 0.01 10*3/uL (ref 0.00–0.07)
Basophils Absolute: 0.1 10*3/uL (ref 0.0–0.1)
Basophils Relative: 1 %
Eosinophils Absolute: 0.4 10*3/uL (ref 0.0–0.5)
Eosinophils Relative: 5 %
HCT: 33.7 % — ABNORMAL LOW (ref 39.0–52.0)
Hemoglobin: 10.7 g/dL — ABNORMAL LOW (ref 13.0–17.0)
Immature Granulocytes: 0 %
Lymphocytes Relative: 28 %
Lymphs Abs: 2.4 10*3/uL (ref 0.7–4.0)
MCH: 27.2 pg (ref 26.0–34.0)
MCHC: 31.8 g/dL (ref 30.0–36.0)
MCV: 85.5 fL (ref 80.0–100.0)
Monocytes Absolute: 0.8 10*3/uL (ref 0.1–1.0)
Monocytes Relative: 9 %
Neutro Abs: 5 10*3/uL (ref 1.7–7.7)
Neutrophils Relative %: 57 %
Platelet Count: 227 10*3/uL (ref 150–400)
RBC: 3.94 MIL/uL — ABNORMAL LOW (ref 4.22–5.81)
RDW: 16.8 % — ABNORMAL HIGH (ref 11.5–15.5)
WBC Count: 8.7 10*3/uL (ref 4.0–10.5)
nRBC: 0 % (ref 0.0–0.2)

## 2022-08-17 LAB — FERRITIN: Ferritin: 146 ng/mL (ref 24–336)

## 2022-08-17 LAB — IRON AND IRON BINDING CAPACITY (CC-WL,HP ONLY)
Iron: 54 ug/dL (ref 45–182)
Saturation Ratios: 16 % — ABNORMAL LOW (ref 17.9–39.5)
TIBC: 339 ug/dL (ref 250–450)
UIBC: 285 ug/dL (ref 117–376)

## 2022-08-17 LAB — RETICULOCYTES
Immature Retic Fract: 13.9 % (ref 2.3–15.9)
RBC.: 3.91 MIL/uL — ABNORMAL LOW (ref 4.22–5.81)
Retic Count, Absolute: 55.1 10*3/uL (ref 19.0–186.0)
Retic Ct Pct: 1.4 % (ref 0.4–3.1)

## 2022-08-17 NOTE — Progress Notes (Signed)
Hematology and Oncology Follow Up Visit  Tommy Green [id number] 26-Oct-1945 76 y.o. 08/17/2022   Principle Diagnosis:  Iron deficiency anemia  Erythropoietin deficiency anemia (Epo 7.2)  Current Therapy:   IV iron as indicated    Interim History:  Tommy Green is here today with his wife for follow-up. He is doing well and notes that he does feel that his energy is better since receiving IV iron.  He has not noted any obvious blood loss. No abnormal bruising, no petechiae.  No fever, chills, n/v, cough, rash, dizziness, SOB, chest pain, palpitations, abdominal pain or changes in bowel or bladder habits.  No minimal swelling in his feet and ankles at the end of the day. Resolves over night.  No falls or syncope to report. He ambulates with a Rolator for added support.  Appetite and hydration have been good. Weight is stable at 238 lbs.   ECOG Performance Status: 1 - Symptomatic but completely ambulatory  Medications:  Allergies as of 08/17/2022       Reactions   Canagliflozin Other (See Comments)   Pt reports that the use of Invokana caused acute KIDNEY FAILURE ([facility] records) and "allergic," per [facility]   Aspirin Other (See Comments)   Gastroesophageal reflux and "allergic," per [facility]   Atorvastatin Other (See Comments)   Myalgias, GI upset, and "allergic," per [facility]   Statins Other (See Comments)   Muscle pain- "allergic," per [facility]        Medication List        Accurate as of August 17, 2022  1:31 PM. If you have any questions, ask your nurse or doctor.          acetaminophen 325 MG tablet Commonly known as: TYLENOL Take 650 mg by mouth every 6 (six) hours as needed for mild pain or headache.   amLODipine 5 MG tablet Commonly known as: NORVASC Take 1 tablet (5 mg total) by mouth daily.   amoxicillin 500 MG tablet Commonly known as: AMOXIL Take 4 tablets (2,000 mg total) by mouth as directed. 1 HOUR PRIOR TO DENTAL APPOINTMENTS   B COMPLEX PO Take 1 tablet by  mouth daily.   cyanocobalamin 1000 MCG tablet Commonly known as: VITAMIN B12 Take 1,000 mcg by mouth daily.   Enbrel Mini 50 MG/ML Soct Generic drug: Etanercept Inject 50 mg into the skin once a week.   escitalopram 10 MG tablet Commonly known as: Lexapro Take 1 tablet (10 mg total) by mouth daily.   ezetimibe 10 MG tablet Commonly known as: Zetia Take 1 tablet (10 mg total) by mouth daily. For elevated cholesterol and triglycerides   fenofibrate micronized 200 MG capsule Commonly known as: LOFIBRA TAKE 1 CAPSULE DAILY BEFOREBREAKFAST   fluticasone 50 MCG/ACT nasal spray Commonly known as: FLONASE Place 2 sprays into both nostrils daily.   furosemide 80 MG tablet Commonly known as: LASIX TAKE 1 TABLET BY MOUTH EVERY DAY   HYDROcodone-acetaminophen 7.5-325 MG tablet Commonly known as: NORCO Take 1 tablet by mouth every 6 (six) hours as needed for moderate pain.   HYDROcodone-acetaminophen 7.5-325 MG tablet Commonly known as: NORCO Take 1 tablet by mouth every 6 (six) hours as needed for moderate pain. Start taking on: September 03, 2022   HYDROcodone-acetaminophen 7.5-325 MG tablet Commonly known as: Norco Take 1 tablet by mouth every 6 (six) hours as needed for moderate pain. Start taking on: October 03, 2022   insulin degludec 100 UNIT/ML FlexTouch Pen Commonly known as: TRESIBA Inject 30 Units  into the skin at bedtime.   leflunomide 10 MG tablet Commonly known as: ARAVA Take 1 tablet (10 mg total) by mouth daily.   lisinopril 40 MG tablet Commonly known as: ZESTRIL Take 1 tablet (40 mg total) by mouth in the morning.   magnesium oxide 400 MG tablet Commonly known as: MAG-OX Take 400 mg by mouth daily with lunch.   melatonin 5 MG Tabs Take 5 mg by mouth at bedtime.   multivitamin with minerals tablet Take 1 tablet by mouth daily with breakfast.   NovoLOG FlexPen 100 UNIT/ML FlexPen Generic drug: insulin aspart Inject 0-10 Units into the skin See  admin instructions. Inject 0-10 units into the skin before meals, PER SLIDING SCALE: BGL 0-200 = give nothing, 201-250 = 2 units, 251-300 = 4 units, 301-350 = 6 units, 351-400 = 8 units, >400 = 10 units, push fluids, and repeat BGL check in 2 hours. If BGL remains >400 after 2 hours, CALL MD.   Omega 3 1000 MG Caps Take 1,000 mg by mouth daily with lunch.   omeprazole 20 MG capsule Commonly known as: PRILOSEC TAKE 1 CAPSULE DAILY BEFOREBREAKFAST   OneTouch Verio test strip Generic drug: glucose blood 4 (four) times daily.   Potassium 99 MG Tabs Take 99 mg by mouth every evening.   triamcinolone 0.1 % paste Commonly known as: KENALOG Use as directed 1 Application in the mouth or throat 2 (two) times daily.   triamcinolone cream 0.1 % Commonly known as: KENALOG Apply 1 Application topically 2 (two) times daily.   Vitamin D3 Super Strength 50 MCG (2000 UT) Caps Generic drug: Cholecalciferol Take 2,000 Units by mouth daily.   vitamin E 180 MG (400 UNITS) capsule Take 400 Units by mouth daily.        Allergies:  Allergies  Allergen Reactions   Canagliflozin Other (See Comments)    Pt reports that the use of Invokana caused acute KIDNEY FAILURE ([facility] records) and "allergic," per [facility]   Aspirin Other (See Comments)    Gastroesophageal reflux and "allergic," per [facility]    Atorvastatin Other (See Comments)    Myalgias, GI upset, and "allergic," per [facility]   Statins Other (See Comments)    Muscle pain- "allergic," per [facility]    Past Medical History, Surgical history, Social history, and Family History were reviewed and updated.  Review of Systems: All other 10 point review of systems is negative.   Physical Exam:  weight is 238 lb (108 kg). His oral temperature is 98 F (36.7 C). His blood pressure is 154/62 (abnormal) and his pulse is 82. His respiration is 18 and oxygen saturation is 100%.   Wt Readings from Last 3 Encounters:  08/17/22 238 lb (108 kg)  07/06/22 234 lb 1.6  oz (106.2 kg)  06/29/22 233 lb 1.9 oz (105.7 kg)    Ocular: Sclerae unicteric, pupils equal, round and reactive to light Ear-nose-throat: Oropharynx clear, dentition fair Lymphatic: No cervical or supraclavicular adenopathy Lungs no rales or rhonchi, good excursion bilaterally Heart regular rate and rhythm, no murmur appreciated Abd soft, nontender, positive bowel sounds MSK no focal spinal tenderness, no joint edema Neuro: non-focal, well-oriented, appropriate affect Breasts: Deferred   Lab Results  Component Value Date   WBC 8.7 08/17/2022   HGB 10.7 (L) 08/17/2022   HCT 33.7 (L) 08/17/2022   MCV 85.5 08/17/2022   PLT 227 08/17/2022   Lab Results  Component Value Date   FERRITIN 35 06/29/2022   IRON 35 (L) 06/29/2022  TIBC 376 06/29/2022   UIBC 341 06/29/2022   IRONPCTSAT 9 (LL) 06/29/2022   Lab Results  Component Value Date   RETICCTPCT 1.4 08/17/2022   RBC 3.94 (L) 08/17/2022   RBC 3.91 (L) 08/17/2022   RETICCTABS 91.3 02/18/2006   No results found for: "KPAFRELGTCHN", "LAMBDASER", "KAPLAMBRATIO" Lab Results  Component Value Date   IGGSERUM 832 04/17/2020   IGMSERUM 41 (L) 04/17/2020   Lab Results  Component Value Date   ALBUMINELP 3.6 (L) 04/17/2020   A1GS 0.3 04/17/2020   A2GS 0.8 04/17/2020   BETS 0.5 04/17/2020   BETA2SER 0.3 04/17/2020   GAMS 0.7 (L) 04/17/2020   SPEI  04/17/2020     Comment:     . A poorly-defined band of restricted protein mobility is detected in the gamma globulins. It is unlikely that this may represent a monoclonal protein; however, immunofixation analysis is available if clinically indicated. .      Chemistry      Component Value Date/Time   NA 137 08/17/2022 1259   NA 137 06/29/2022 1456   K 4.7 08/17/2022 1259   CL 104 08/17/2022 1259   CO2 24 08/17/2022 1259   BUN 40 (H) 08/17/2022 1259   BUN 29 (H) 06/29/2022 1456   CREATININE 1.87 (H) 08/17/2022 1259   CREATININE 1.72 (H) 11/17/2021 1352      Component  Value Date/Time   CALCIUM 8.9 08/17/2022 1259   ALKPHOS 49 08/17/2022 1259   AST 19 08/17/2022 1259   ALT 15 08/17/2022 1259   BILITOT 0.3 08/17/2022 1259       Impression and Plan: Mr. Weed is a very pleasant 76 yo caucasian gentleman with history of iron deficiency anemia.   Iron studies are pending. If his anemia does not continue to improve, we will consider adding an ESA.  Follow-up in 2 months.    Lottie Dawson, NP 10/31/20231:31 PM

## 2022-08-18 ENCOUNTER — Ambulatory Visit (INDEPENDENT_AMBULATORY_CARE_PROVIDER_SITE_OTHER): Payer: Medicare HMO

## 2022-08-18 VITALS — BP 120/65 | Temp 98.0°F

## 2022-08-18 DIAGNOSIS — Z0000 Encounter for general adult medical examination without abnormal findings: Secondary | ICD-10-CM

## 2022-08-18 NOTE — Patient Instructions (Addendum)
Tommy Green , Thank you for taking time to come for your Medicare Wellness Visit. I appreciate your ongoing commitment to your health goals. Please review the following plan we discussed and let me know if I can assist you in the future.   These are the goals we discussed:  Goals      maintain normal A1C     Patient Stated     Losing weight      Pharmacy Care Plan for Chronic Care Management      Dry Mouth:   Current therapy:  Over-the-counter dry mouth lozenges Interventions: Continue to increase water intake and limit sodium Continue to use over-the-counter gum, lozenges or rinses for dry mouth like ACT dry mouth, Biotene or Hydral if needed. However use sparingly since the sugar alcohols (sorbitol and xylitol) in them might cause gas, bloating and loose stools.  Diabetes: Diabetes controlled per last A1c but is due to see endocrinologist. A1c goal < 7.0%  Lab Results  Component Value Date   HGBA1C 5.7 (H) 01/22/2022  Endocrinologist is Dr Meredith Pel Current treatment: Tyler Aas 30 units daily at bedtime Novolog insulin inject 0 to 10 units per sliding scale prior to each meal  Interventions:  Continue to follow up with Dr Meredith Pel.  Consider Continuous Glucose Monitor  How to Treat a Low Glucose Level:  If you have a low blood glucose less than 70, please eat / drink 15 grams of carbohydrates (4 oz of juice, soda, 4 glucose tablets, or 3-4 pieces of hard candy).  It is best so choose a "quick" source of sugar that does no contain fat (chocolate and peanut butter might take longer to increase your blood glucose) Wait 15 minutes and then recheck your blood glucose. If your blood glucose is still less than 70, eat another 15 grams of carbohydrates.  Wait another 15 minutes and recheck your glucose.  Continue this until your blood glucose is over 70. Once you blood glucose is over 70, eat a snack with protein in it to prevent your blood glucose from dropping again. Reviewed home blood  glucose readings and reviewed goals  Fasting blood glucose goal (before meals) = 80 to 130 Blood glucose goal after a meal = less than 180   Hypertension: Controlled; blood pressure goal < 130/80 BP Readings from Last 3 Encounters:  06/16/22 132/78  05/17/22 128/82  04/30/22 132/70  Current treatment: Amlodipine '10mg'$  daily  Lisinopril '40mg'$  daily Interventions:  Continue to take lisinopril and amlodipine  Continue to check blood pressure daily and record  Hyperlipidemia, mixed: Uncontrolled; LDL goal < 70 and Tg goal < 150 Current treatment: Fenofibrate '200mg'$  daily with breakfast.  Fish oil '1000mg'$  daily Ezetimibe '10mg'$  daily  Interventions:  Continue ezetimibe, fenofibrate and fish oil. Recheck lipids in 6 to 8 weeks.   Medication management Pharmacist Clinical Goal(s): Over the next 90 days, patient will work with PharmD and providers to maintain optimal medication adherence Current pharmacy: [facility] Interventions Comprehensive medication review performed. Reviewed refill history and assessed adherence Continue current medication management strategy  Reminded patient to limit use of nasal spray decongestant - do not use more than 3 day in a row.   Patient Goals/Self-Care Activities Over the next 90 days, patient will:  take medications as prescribed,  check glucose 3 to 4 times a day prior to meals and at bedtime or as needed, document, and provide at future appointments,  check blood pressure 2 to 3 times per week, document, and provide at  future appointments, and  How to Treat a Low Glucose Level:  If you have a low blood glucose less than 70, please eat / drink 15 grams of carbohydrates (4 oz of juice, soda (not diet), 4 glucose tablets, or 3-4 pieces of hard candy).  It is best so choose a "quick" source of sugar that does no contain fat (chocolate and peanut butter might take longer to increase your blood glucose) Wait 15 minutes and then recheck your blood  glucose. If your blood glucose is still less than 70, eat another 15 grams of carbohydrates.  Wait another 15 minutes and recheck your glucose.  Continue this until your blood glucose is over 70. Once you blood glucose is over 70, eat a snack with protein in it to prevent your blood glucose from dropping again. limit use of nasal spray decongestant to prevent rebound congestion - do not use more than 3 day in a row.    Follow Up Plan: Telephone follow up appointment with care management team member scheduled for:  12 weeks.           This is a list of the screening recommended for you and due dates:  Health Maintenance  Topic Date Due   Yearly kidney health urinalysis for diabetes  05/04/2022   Flu Shot  05/18/2022   Yearly kidney function blood test for diabetes  08/18/2023   Medicare Annual Wellness Visit  08/19/2023   Colon Cancer Screening  09/03/2027   Tetanus Vaccine  12/24/2028   Pneumonia Vaccine  Completed   Hepatitis C Screening: USPSTF Recommendation to screen - Ages 18-79 yo.  Completed   HPV Vaccine  Aged Out   Complete foot exam   Discontinued   Hemoglobin A1C  Discontinued   Eye exam for diabetics  Discontinued   COVID-19 Vaccine  Discontinued   Zoster (Shingles) Vaccine  Discontinued    Advanced directives: Please bring a copy of your health care power of attorney and living will to the office at your convenience.  Conditions/risks identified: losing weight   Next appointment: Follow up in one year for your annual wellness visit.   Preventive Care 39 Years and Older, Male  Preventive care refers to lifestyle choices and visits with your health care provider that can promote health and wellness. What does preventive care include? A yearly physical exam. This is also called an annual well check. Dental exams once or twice a year. Routine eye exams. Ask your health care provider how often you should have your eyes checked. Personal lifestyle choices,  including: Daily care of your teeth and gums. Regular physical activity. Eating a healthy diet. Avoiding tobacco and drug use. Limiting alcohol use. Practicing safe sex. Taking low doses of aspirin every day. Taking vitamin and mineral supplements as recommended by your health care provider. What happens during an annual well check? The services and screenings done by your health care provider during your annual well check will depend on your age, overall health, lifestyle risk factors, and family history of disease. Counseling  Your health care provider may ask you questions about your: Alcohol use. Tobacco use. Drug use. Emotional well-being. Home and relationship well-being. Sexual activity. Eating habits. History of falls. Memory and ability to understand (cognition). Work and work Statistician. Screening  You may have the following tests or measurements: Height, weight, and BMI. Blood pressure. Lipid and cholesterol levels. These may be checked every 5 years, or more frequently if you are over 49 years old. Skin  check. Lung cancer screening. You may have this screening every year starting at age 33 if you have a 30-pack-year history of smoking and currently smoke or have quit within the past 15 years. Fecal occult blood test (FOBT) of the stool. You may have this test every year starting at age 2. Flexible sigmoidoscopy or colonoscopy. You may have a sigmoidoscopy every 5 years or a colonoscopy every 10 years starting at age 65. Prostate cancer screening. Recommendations will vary depending on your family history and other risks. Hepatitis C blood test. Hepatitis B blood test. Sexually transmitted disease (STD) testing. Diabetes screening. This is done by checking your blood sugar (glucose) after you have not eaten for a while (fasting). You may have this done every 1-3 years. Abdominal aortic aneurysm (AAA) screening. You may need this if you are a current or former  smoker. Osteoporosis. You may be screened starting at age 38 if you are at high risk. Talk with your health care provider about your test results, treatment options, and if necessary, the need for more tests. Vaccines  Your health care provider may recommend certain vaccines, such as: Influenza vaccine. This is recommended every year. Tetanus, diphtheria, and acellular pertussis (Tdap, Td) vaccine. You may need a Td booster every 10 years. Zoster vaccine. You may need this after age 23. Pneumococcal 13-valent conjugate (PCV13) vaccine. One dose is recommended after age 15. Pneumococcal polysaccharide (PPSV23) vaccine. One dose is recommended after age 31. Talk to your health care provider about which screenings and vaccines you need and how often you need them. This information is not intended to replace advice given to you by your health care provider. Make sure you discuss any questions you have with your health care provider. Document Released: 10/31/2015 Document Revised: 06/23/2016 Document Reviewed: 08/05/2015 Elsevier Interactive Patient Education  2017 [place] Prevention in the Home Falls can cause injuries. They can happen to people of all ages. There are many things you can do to make your home safe and to help prevent falls. What can I do on the outside of my home? Regularly fix the edges of walkways and driveways and fix any cracks. Remove anything that might make you trip as you walk through a door, such as a raised step or threshold. Trim any bushes or trees on the path to your home. Use bright outdoor lighting. Clear any walking paths of anything that might make someone trip, such as rocks or tools. Regularly check to see if handrails are loose or broken. Make sure that both sides of any steps have handrails. Any raised decks and porches should have guardrails on the edges. Have any leaves, snow, or ice cleared regularly. Use sand or salt on walking paths during  winter. Clean up any spills in your garage right away. This includes oil or grease spills. What can I do in the bathroom? Use night lights. Install grab bars by the toilet and in the tub and shower. Do not use towel bars as grab bars. Use non-skid mats or decals in the tub or shower. If you need to sit down in the shower, use a plastic, non-slip stool. Keep the floor dry. Clean up any water that spills on the floor as soon as it happens. Remove soap buildup in the tub or shower regularly. Attach bath mats securely with double-sided non-slip rug tape. Do not have throw rugs and other things on the floor that can make you trip. What can I do in the  bedroom? Use night lights. Make sure that you have a light by your bed that is easy to reach. Do not use any sheets or blankets that are too big for your bed. They should not hang down onto the floor. Have a firm chair that has side arms. You can use this for support while you get dressed. Do not have throw rugs and other things on the floor that can make you trip. What can I do in the kitchen? Clean up any spills right away. Avoid walking on wet floors. Keep items that you use a lot in easy-to-reach places. If you need to reach something above you, use a strong step stool that has a grab bar. Keep electrical cords out of the way. Do not use floor polish or wax that makes floors slippery. If you must use wax, use non-skid floor wax. Do not have throw rugs and other things on the floor that can make you trip. What can I do with my stairs? Do not leave any items on the stairs. Make sure that there are handrails on both sides of the stairs and use them. Fix handrails that are broken or loose. Make sure that handrails are as long as the stairways. Check any carpeting to make sure that it is firmly attached to the stairs. Fix any carpet that is loose or worn. Avoid having throw rugs at the top or bottom of the stairs. If you do have throw rugs,  attach them to the floor with carpet tape. Make sure that you have a light switch at the top of the stairs and the bottom of the stairs. If you do not have them, ask someone to add them for you. What else can I do to help prevent falls? Wear shoes that: Do not have high heels. Have rubber bottoms. Are comfortable and fit you well. Are closed at the toe. Do not wear sandals. If you use a stepladder: Make sure that it is fully opened. Do not climb a closed stepladder. Make sure that both sides of the stepladder are locked into place. Ask someone to hold it for you, if possible. Clearly mark and make sure that you can see: Any grab bars or handrails. First and last steps. Where the edge of each step is. Use tools that help you move around (mobility aids) if they are needed. These include: Canes. Walkers. Scooters. Crutches. Turn on the lights when you go into a dark area. Replace any light bulbs as soon as they burn out. Set up your furniture so you have a clear path. Avoid moving your furniture around. If any of your floors are uneven, fix them. If there are any pets around you, be aware of where they are. Review your medicines with your doctor. Some medicines can make you feel dizzy. This can increase your chance of falling. Ask your doctor what other things that you can do to help prevent falls. This information is not intended to replace advice given to you by your health care provider. Make sure you discuss any questions you have with your health care provider. Document Released: 07/31/2009 Document Revised: 03/11/2016 Document Reviewed: 11/08/2014 Elsevier Interactive Patient Education  2017 [employer].

## 2022-08-18 NOTE — Progress Notes (Signed)
I connected with  Tommy Green on 08/18/22 by a audio enabled telemedicine application and verified that I am speaking with the correct person using two identifiers.  Patient Location: Home  Provider Location: Home Office  I discussed the limitations of evaluation and management by telemedicine. The patient expressed understanding and agreed to proceed.   Subjective:   Tommy Green is a 76 y.o. male who presents for Medicare Annual/Subsequent preventive examination.  Review of Systems     Cardiac Risk Factors include: advanced age (>27mn, >>76women);hypertension;male gender;dyslipidemia;diabetes mellitus;obesity (BMI >30kg/m2)     Objective:    Today's Vitals   08/18/22 1024  BP: 120/65  Temp: 98 F (36.7 C)   There is no height or weight on file to calculate BMI.     08/18/2022   10:31 AM 08/17/2022    1:22 PM 07/06/2022    8:52 AM 01/27/2022    3:00 AM 01/26/2022    6:00 PM 01/22/2022   11:40 AM 12/09/2021   10:36 AM  Advanced Directives  Does Patient Have a Medical Advance Directive? Yes Yes Yes Yes Yes Yes Yes  Type of AParamedicof ACrestonLiving will HFort LuptonLiving will HMeridianvilleLiving will HLos AltosLiving will  [place]al[place]Living will Living will  Does patient want to make changes to medical advance directive?    No - Patient declined  No - Patient declined Yes (ED - send information to MyChart)  Copy of H[place]in Chart? No - copy requested Yes - validated most recent copy scanned in chart (See row information) Yes - validated most recent copy scanned in chart (See row information) Yes - validated most recent copy scanned in chart (See row information)  No - copy requested   Would patient like information on creating a medical advance directive?  No - Patient declined         Current Medications (verified) Outpatient Encounter Medications  as of 08/18/2022  Medication Sig   amLODipine (NORVASC) 5 MG tablet Take 1 tablet (5 mg total) by mouth daily.   B Complex Vitamins (B COMPLEX PO) Take 1 tablet by mouth daily.   Cholecalciferol (VITAMIN D3 SUPER STRENGTH) 50 MCG (2000 UT) CAPS Take 2,000 Units by mouth daily.   escitalopram (LEXAPRO) 10 MG tablet Take 1 tablet (10 mg total) by mouth daily.   Etanercept (ENBREL MINI) 50 MG/ML SOCT Inject 50 mg into the skin once a week.   ezetimibe (ZETIA) 10 MG tablet Take 1 tablet (10 mg total) by mouth daily. For elevated cholesterol and triglycerides   fenofibrate micronized (LOFIBRA) 200 MG capsule TAKE 1 CAPSULE DAILY BEFOREBREAKFAST   fluticasone (FLONASE) 50 MCG/ACT nasal spray Place 2 sprays into both nostrils daily.   furosemide (LASIX) 80 MG tablet TAKE 1 TABLET BY MOUTH EVERY DAY   [START ON 10/03/2022] HYDROcodone-acetaminophen (NORCO) 7.5-325 MG tablet Take 1 tablet by mouth every 6 (six) hours as needed for moderate pain.   insulin aspart (NOVOLOG FLEXPEN) 100 UNIT/ML FlexPen Inject 0-10 Units into the skin See admin instructions. Inject 0-10 units into the skin before meals, PER SLIDING SCALE: BGL 0-200 = give nothing, 201-250 = 2 units, 251-300 = 4 units, 301-350 = 6 units, 351-400 = 8 units, >400 = 10 units, push fluids, and repeat BGL check in 2 hours. If BGL remains >400 after 2 hours, CALL MD.   insulin degludec (TRESIBA) 100 UNIT/ML FlexTouch Pen Inject 30  Units into the skin at bedtime.   leflunomide (ARAVA) 10 MG tablet Take 1 tablet (10 mg total) by mouth daily.   lisinopril (ZESTRIL) 40 MG tablet Take 1 tablet (40 mg total) by mouth in the morning.   magnesium oxide (MAG-OX) 400 MG tablet Take 400 mg by mouth daily with lunch.   melatonin 5 MG TABS Take 5 mg by mouth at bedtime.   Multiple Vitamins-Minerals (MULTIVITAMIN WITH MINERALS) tablet Take 1 tablet by mouth daily with breakfast.   Omega 3 1000 MG CAPS Take 1,000 mg by mouth daily with lunch.   omeprazole  (PRILOSEC) 20 MG capsule TAKE 1 CAPSULE DAILY BEFOREBREAKFAST   ONETOUCH VERIO test strip 4 (four) times daily.   Potassium 99 MG TABS Take 99 mg by mouth every evening.   vitamin B-12 (CYANOCOBALAMIN) 1000 MCG tablet Take 1,000 mcg by mouth daily.   vitamin E 180 MG (400 UNITS) capsule Take 400 Units by mouth daily.   acetaminophen (TYLENOL) 325 MG tablet Take 650 mg by mouth every 6 (six) hours as needed for mild pain or headache. (Patient not taking: Reported on 08/18/2022)   amoxicillin (AMOXIL) 500 MG tablet Take 4 tablets (2,000 mg total) by mouth as directed. 1 HOUR PRIOR TO DENTAL APPOINTMENTS (Patient not taking: Reported on 07/06/2022)   [START ON 09/03/2022] HYDROcodone-acetaminophen (NORCO) 7.5-325 MG tablet Take 1 tablet by mouth every 6 (six) hours as needed for moderate pain.   HYDROcodone-acetaminophen (NORCO) 7.5-325 MG tablet Take 1 tablet by mouth every 6 (six) hours as needed for moderate pain.   triamcinolone (KENALOG) 0.1 % paste Use as directed 1 Application in the mouth or throat 2 (two) times daily. (Patient not taking: Reported on 08/18/2022)   triamcinolone cream (KENALOG) 0.1 % Apply 1 Application topically 2 (two) times daily. (Patient not taking: Reported on 08/18/2022)   No facility-administered encounter medications on file as of 08/18/2022.    Allergies (verified) Canagliflozin, Aspirin, Atorvastatin, and Statins   History: Past Medical History:  Diagnosis Date   Anemia    Essential hypertension 07/18/2017   Foot drop, left foot    due to back surgery in 01/2021   GERD (gastroesophageal reflux disease)    History of colonic polyps    History of hiatal hernia    many years ago   History of rheumatic fever    History of stroke 07/18/2017   Hypertriglyceridemia 07/18/2017   S/P TAVR (transcatheter aortic valve replacement) 01/26/2022   Edwards S3UR 69m via TF approach with Dr. [name] Dr. BCyndia Bent  Severe aortic stenosis    Type 2 diabetes mellitus with  diabetic neuropathy, unspecified (HTabor 07/18/2017   Past Surgical History:  Procedure Laterality Date   COLONOSCOPY     around 2018 [facility]   COLONOSCOPY  09/02/2020   CRANIOTOMY Left 10/25/2019   Procedure: CRANIOTOMY HEMATOMA EVACUATION SUBDURAL;  Surgeon: [name] [name];  [place] [facility]  Service: Neurosurgery;  Laterality: Left;   ESOPHAGOGASTRODUODENOSCOPY     around 2018 with [facility]   FOOT CAPSULOTOMY Left 07/25/2008   Mid Foot #2 MPJ   Hammertoe Repair Left 07/25/2008   #2 toe   INTRAOPERATIVE TRANSTHORACIC ECHOCARDIOGRAM N/A 01/26/2022   Procedure: INTRAOPERATIVE TRANSTHORACIC ECHOCARDIOGRAM;  Surgeon: TEarly Osmond MD;  [place] [facility]  Service: Open Heart Surgery;  Laterality: N/A;   RIGHT/LEFT HEART CATH AND CORONARY ANGIOGRAPHY N/A 12/11/2021   Procedure: RIGHT/LEFT HEART CATH AND CORONARY ANGIOGRAPHY;  Surgeon: [name];  Location: [facility];  Service: Cardiovascular;  Laterality: N/A;   SPINAL FUSION  01/2021   TARSAL TUNNEL RELEASE Left 07/25/2008   TONSILLECTOMY     removed as a child   TRANSCATHETER AORTIC VALVE REPLACEMENT, TRANSFEMORAL N/A 01/26/2022   Procedure: Transcatheter Aortic Valve Replacement 29MM, Transfemoral;  Surgeon: Early Osmond, MD;  Location: [place];  Service: Open Heart Surgery;  Laterality: N/A;  Percutaneous   UPPER GASTROINTESTINAL ENDOSCOPY  09/02/2020   Family History  Problem Relation Age of Onset   Hyperlipidemia Mother    Colon polyps Mother    Hyperlipidemia Father    Alcoholism Brother    Healthy Son    Healthy Son    Cancer Neg Hx    Colon cancer Neg Hx    Esophageal cancer Neg Hx    Rectal cancer Neg Hx    Stomach cancer Neg Hx    Social History   Socioeconomic History   Marital status: Married    Spouse name: Not on file   Number of children: 2   Years of education: Not on file   Highest education level: Not on file  Occupational History   Not on file  Tobacco Use    Smoking status: Former    Packs/day: 1.00    Years: 10.00    Total pack years: 10.00    Types: Cigarettes    Quit date: 1973    Years since quitting: 50.8    Passive exposure: Past   Smokeless tobacco: Never  Vaping Use   Vaping Use: Never used  Substance and Sexual Activity   Alcohol use: Not Currently   Drug use: No   Sexual activity: Yes  Other Topics Concern   Not on file  Social History Narrative   Not on file   Social Determinants of Health   Financial Resource Strain: Low Risk  (08/18/2022)   Overall Financial Resource Strain (CARDIA)    Difficulty of Paying Living Expenses: Not hard at all  Food Insecurity: No Food Insecurity (08/18/2022)   Hunger Vital Sign    Worried About Running Out of Food in the Last Year: Never true    Ran Out of Food in the Last Year: Never true  Transportation Needs: No Transportation Needs (08/18/2022)   PRAPARE - Hydrologist (Medical): No    Lack of Transportation (Non-Medical): No  Physical Activity: Insufficiently Active (08/18/2022)   Exercise Vital Sign    Days of Exercise per Week: 2 days    Minutes of Exercise per Session: 20 min  Stress: No Stress Concern Present (08/18/2022)   [place]    Feeling of Stress : Not at all  Social Connections: Moderately Integrated (08/18/2022)   Social Connection and Isolation Panel [NHANES]    Frequency of Communication with Friends and Family: More than three times a week    Frequency of Social Gatherings with Friends and Family: More than three times a week    Attends Religious Services: More than 4 times per year    Active Member of [employer] or Organizations: No    Attends Archivist Meetings: Never    Marital Status: Married    Tobacco Counseling Counseling given: Not Answered   Clinical Intake:  Pre-visit preparation completed: Yes  Pain : No/denies pain     BMI - recorded:  33.19 Nutritional Status: BMI > 30  Obese Nutritional Risks: None Diabetes: Yes CBG done?:  No CBG resulted in Enter/ Edit results?: No Did pt. bring in CBG monitor from home?: No  How often do you need to have someone help you when you read instructions, pamphlets, or other written materials from your doctor or pharmacy?: 1 - Never  Diabetic?Nutrition Risk Assessment:  Has the patient had any N/V/D within the last 2 months?  No  Does the patient have any non-healing wounds?  No  Has the patient had any unintentional weight loss or weight gain?  No   Diabetes:  Is the patient diabetic?  Yes  If diabetic, was a CBG obtained today?  No  Did the patient bring in their glucometer from home?  No  How often do you monitor your CBG's? Daily .   Financial Strains and Diabetes Management:  Are you having any financial strains with the device, your supplies or your medication? No .  Does the patient want to be seen by Chronic Care Management for management of their diabetes?  No  Would the patient like to be referred to a Nutritionist or for Diabetic Management?  No   Diabetic Exams:  Diabetic Eye Exam: Completed 09/16/17 discontinued per chart  Diabetic Foot Exam: Overdue, Pt has been advised about the importance in completing this exam. Pt is scheduled for diabetic foot exam on next appt .   Interpreter Needed?: No  Information entered by :: Charlott Rakes, LPN   Activities of Daily Living    08/18/2022   10:32 AM 01/26/2022    6:00 PM  In your present state of health, do you have any difficulty performing the following activities:  Hearing? 0 0  Vision? 0 0  Difficulty concentrating or making decisions? 0 0  Walking or climbing stairs? 0 1  Dressing or bathing? 0 1  Doing errands, shopping? 0 0  Preparing Food and eating ? N   Using the Toilet? N   In the past six months, have you accidently leaked urine? N   Do you have problems with loss of bowel control? N   Managing  your Medications? N   Managing your Finances? N   Housekeeping or managing your Housekeeping? N     Patient Care Team: [name], DO as PCP - General (Family Medicine) [name] Hilton Cork, MD as PCP - Cardiology (Cardiology) Alanson Aly, MD as Referring Physician (Internal Medicine) Jimmie Molly, OD (Optometry) Starling Manns, MD (Orthopedic Surgery) [name], RPH-CPP (Pharmacist)  Indicate any recent Medical Services you may have received from other than [facility] providers in the past year (date may be approximate).     Assessment:   This is a routine wellness examination for [place].  Hearing/Vision screen Hearing Screening - Comments:: Pt denies any hearing issue  Vision Screening - Comments:: Pt follows up with provider  Dr Colin Rhein for annul eye exams   Dietary issues and exercise activities discussed: Current Exercise Habits: Home exercise routine, Type of exercise: stretching;walking, Time (Minutes): 20, Frequency (Times/Week): 2, Weekly Exercise (Minutes/Week): 40   Goals Addressed             This Visit's Progress    Patient Stated       Losing weight        Depression Screen    08/18/2022   10:29 AM 06/16/2022   11:06 AM 06/15/2022   12:51 PM 03/22/2022    3:34 PM 08/31/2021    3:32 PM 09/18/2019   10:28 AM 01/05/2018    3:27 PM  PHQ  2/9 Scores  PHQ - 2 Score 0 1 1 0 0 0 0  PHQ- 9 Score  4         Fall Risk    08/18/2022   10:32 AM 03/22/2022    3:33 PM 09/18/2019   10:27 AM 01/05/2018    3:27 PM  Fall Risk   Falls in the past year? 0 0 1 No  Number falls in past yr: 0 0 0   Injury with Fall? 0 0 0   Risk for fall due to : Impaired vision     Follow up Falls prevention discussed  Education provided;Falls prevention discussed     FALL RISK PREVENTION PERTAINING TO THE HOME:  Any stairs in or around the home? No  If so, are there any without handrails? No  Home free of loose throw rugs in walkways, pet beds, electrical cords, etc? Yes   Adequate lighting in your home to reduce risk of falls? Yes   ASSISTIVE DEVICES UTILIZED TO PREVENT FALLS:  Life alert? Yes   Use of a cane, walker or w/c? No  Grab bars in the bathroom? Yes  Shower chair or bench in shower? Yes  Elevated toilet seat or a handicapped toilet? Yes   TIMED UP AND GO:  Was the test performed? No .   Cognitive Function:    01/05/2018    3:32 PM  MMSE - Mini Mental State Exam  Orientation to time 5  Orientation to Place 5  Registration 3  Attention/ Calculation 2  Recall 2  Language- name 2 objects 2  Language- repeat 1  Language- follow 3 step command 3  Language- read & follow direction 1  Write a sentence 1  Copy design 1  Total score 26        08/18/2022   10:33 AM  6CIT Screen  What Year? 0 points  What month? 0 points  What time? 0 points  Count back from 20 0 points  Months in reverse 0 points  Repeat phrase 0 points  Total Score 0 points    Immunizations Immunization History  Administered Date(s) Administered   Fluad Quad(high Dose 65+) 08/31/2021   Influenza, High Dose Seasonal PF 07/31/2013   Influenza-Unspecified 07/31/2013, 04/17/2014   Moderna Sars-Covid-2 Vaccination 12/11/2019, 01/10/2020, 08/08/2020   Pneumococcal Conjugate-13 08/07/2015   Pneumococcal Polysaccharide-23 04/17/2013, 07/18/2013   Td 12/25/2018   Zoster, Live 04/17/2013    TDAP status: Up to date  Flu Vaccine status: Due, Education has been provided regarding the importance of this vaccine. Advised may receive this vaccine at local pharmacy or Health Dept. Aware to provide a copy of the vaccination record if obtained from local pharmacy or Health Dept. Verbalized acceptance and understanding.  Pneumococcal vaccine status: Up to date  Covid-19 vaccine status: Completed vaccines  Qualifies for Shingles Vaccine? No   Zostavax completed No   Shingrix Completed?: No.    Education has been provided regarding the importance of this vaccine. Patient  has been advised to call insurance company to determine out of pocket expense if they have not yet received this vaccine. Advised may also receive vaccine at local pharmacy or Health Dept. Verbalized acceptance and understanding.  Screening Tests Health Maintenance  Topic Date Due   Diabetic kidney evaluation - Urine ACR  05/04/2022   INFLUENZA VACCINE  05/18/2022   Diabetic kidney evaluation - GFR measurement  08/18/2023   Medicare Annual Wellness (AWV)  08/19/2023   COLONOSCOPY (Pts 45-97yr Insurance coverage will  need to be confirmed)  09/03/2027   TETANUS/TDAP  12/24/2028   Pneumonia Vaccine 66+ Years old  Completed   Hepatitis C Screening  Completed   HPV VACCINES  Aged Out   FOOT EXAM  Discontinued   HEMOGLOBIN A1C  Discontinued   OPHTHALMOLOGY EXAM  Discontinued   COVID-19 Vaccine  Discontinued   Zoster Vaccines- Shingrix  Discontinued    Health Maintenance  Health Maintenance Due  Topic Date Due   Diabetic kidney evaluation - Urine ACR  05/04/2022   INFLUENZA VACCINE  05/18/2022    Colorectal cancer screening: Type of screening: Colonoscopy. Completed 09/02/20. Repeat every 7 years  Additional Screening:  Hepatitis C Screening: Completed 04/17/20  Vision Screening: Recommended annual ophthalmology exams for early detection of glaucoma and other disorders of the eye. Is the patient up to date with their annual eye exam?  Yes  Who is the provider or what is the name of the office in which the patient attends annual eye exams? Dr Colin Rhein  If pt is not established with a provider, would they like to be referred to a provider to establish care? No .   Dental Screening: Recommended annual dental exams for proper oral hygiene  Community Resource Referral / Chronic Care Management: CRR required this visit?  No   CCM required this visit?  No      Plan:     I have personally reviewed and noted the following in the patient's chart:   Medical and social history Use of  alcohol, tobacco or illicit drugs  Current medications and supplements including opioid prescriptions. Patient is currently taking opioid prescriptions. Information provided to patient regarding non-opioid alternatives. Patient advised to discuss non-opioid treatment plan with their provider. Functional ability and status Nutritional status Physical activity Advanced directives List of other physicians Hospitalizations, surgeries, and ER visits in previous 12 months Vitals Screenings to include cognitive, depression, and falls Referrals and appointments  In addition, I have reviewed and discussed with patient certain preventive protocols, quality metrics, and best practice recommendations. A written personalized care plan for preventive services as well as general preventive health recommendations were provided to patient.     Willette Brace, LPN   49/11/98   Nurse Notes: none

## 2022-08-23 DIAGNOSIS — R2231 Localized swelling, mass and lump, right upper limb: Secondary | ICD-10-CM | POA: Diagnosis not present

## 2022-08-23 DIAGNOSIS — M79641 Pain in right hand: Secondary | ICD-10-CM | POA: Diagnosis not present

## 2022-08-23 DIAGNOSIS — R2232 Localized swelling, mass and lump, left upper limb: Secondary | ICD-10-CM | POA: Diagnosis not present

## 2022-08-23 DIAGNOSIS — M79642 Pain in left hand: Secondary | ICD-10-CM | POA: Diagnosis not present

## 2022-08-25 ENCOUNTER — Encounter: Payer: Self-pay | Admitting: Physician Assistant

## 2022-08-25 ENCOUNTER — Telehealth: Payer: Self-pay | Admitting: Rheumatology

## 2022-08-25 ENCOUNTER — Ambulatory Visit: Payer: Medicare HMO | Attending: Physician Assistant | Admitting: Physician Assistant

## 2022-08-25 VITALS — BP 163/74 | HR 66 | Resp 18 | Ht 71.0 in | Wt 233.6 lb

## 2022-08-25 DIAGNOSIS — M5136 Other intervertebral disc degeneration, lumbar region: Secondary | ICD-10-CM

## 2022-08-25 DIAGNOSIS — E1169 Type 2 diabetes mellitus with other specified complication: Secondary | ICD-10-CM

## 2022-08-25 DIAGNOSIS — M51369 Other intervertebral disc degeneration, lumbar region without mention of lumbar back pain or lower extremity pain: Secondary | ICD-10-CM

## 2022-08-25 DIAGNOSIS — M1712 Unilateral primary osteoarthritis, left knee: Secondary | ICD-10-CM

## 2022-08-25 DIAGNOSIS — I10 Essential (primary) hypertension: Secondary | ICD-10-CM

## 2022-08-25 DIAGNOSIS — L920 Granuloma annulare: Secondary | ICD-10-CM

## 2022-08-25 DIAGNOSIS — M0579 Rheumatoid arthritis with rheumatoid factor of multiple sites without organ or systems involvement: Secondary | ICD-10-CM | POA: Diagnosis not present

## 2022-08-25 DIAGNOSIS — M160 Bilateral primary osteoarthritis of hip: Secondary | ICD-10-CM | POA: Diagnosis not present

## 2022-08-25 DIAGNOSIS — S065XAA Traumatic subdural hemorrhage with loss of consciousness status unknown, initial encounter: Secondary | ICD-10-CM | POA: Diagnosis not present

## 2022-08-25 DIAGNOSIS — M24521 Contracture, right elbow: Secondary | ICD-10-CM | POA: Diagnosis not present

## 2022-08-25 DIAGNOSIS — Z8673 Personal history of transient ischemic attack (TIA), and cerebral infarction without residual deficits: Secondary | ICD-10-CM

## 2022-08-25 DIAGNOSIS — Z79899 Other long term (current) drug therapy: Secondary | ICD-10-CM | POA: Diagnosis not present

## 2022-08-25 DIAGNOSIS — M25572 Pain in left ankle and joints of left foot: Secondary | ICD-10-CM

## 2022-08-25 DIAGNOSIS — E785 Hyperlipidemia, unspecified: Secondary | ICD-10-CM

## 2022-08-25 DIAGNOSIS — Z952 Presence of prosthetic heart valve: Secondary | ICD-10-CM

## 2022-08-25 DIAGNOSIS — Z794 Long term (current) use of insulin: Secondary | ICD-10-CM

## 2022-08-25 DIAGNOSIS — E1140 Type 2 diabetes mellitus with diabetic neuropathy, unspecified: Secondary | ICD-10-CM

## 2022-08-25 DIAGNOSIS — D049 Carcinoma in situ of skin, unspecified: Secondary | ICD-10-CM

## 2022-08-25 MED ORDER — ENBREL MINI 50 MG/ML ~~LOC~~ SOCT: 50.0000 mg | SUBCUTANEOUS | 0 refills | Status: DC

## 2022-08-25 NOTE — Telephone Encounter (Signed)
Patients wife is requesting a refill of Jerimie' Enbrel. She states only a week was sent last time and he is coming up due again. [employer].

## 2022-08-25 NOTE — Patient Instructions (Addendum)
Standing Labs We placed an order today for your standing lab work.   Please have your standing labs drawn in  January and every 3 months   Please have your labs drawn 2 weeks prior to your appointment so that the provider can discuss your lab results at your appointment.  Please note that you may see your imaging and lab results in [place] before we have reviewed them. We will contact you once all results are reviewed. Please allow our office up to 72 hours to thoroughly review all of the results before contacting the office for clarification of your results.  Lab hours are:   Monday through Thursday from 8:00 am -12:30 pm and 1:00 pm-5:00 pm and Friday from 8:00 am-12:00 pm.  Please be advised, all patients with office appointments requiring lab work will take precedent over walk-in lab work.   Labs are drawn by [facility]. Please bring your co-pay at the time of your lab draw.  You may receive a bill from [facility] for your lab work.  Please note if you are on Hydroxychloroquine and and an order has been placed for a Hydroxychloroquine level, you will need to have it drawn 4 hours or more after your last dose.  If you wish to have your labs drawn at another location, please call the office 24 hours in advance so we can fax the orders.  The office is located at [address] [place], [place], [place] [address] No appointment is necessary.    If you have any questions regarding directions or hours of operation,  please call [phone].   As a reminder, please drink plenty of water prior to coming for your lab work. Thanks!  If you have signs or symptoms of an infection or start antibiotics: First, call your PCP for workup of your infection. Hold your medication through the infection, until you complete your antibiotics, and until symptoms resolve if you take the following: Injectable medication (Actemra, Benlysta, Cimzia, Cosentyx, Enbrel, Humira, Kevzara, Orencia, Remicade, Simponi,  Stelara, Taltz, Tremfya) Methotrexate Leflunomide (Arava) Mycophenolate (Cellcept) Morrie Sheldon, Olumiant, or Rinvoq   Vaccines You are taking a medication(s) that can suppress your immune system.  The following immunizations are recommended: Flu annually Covid-19  Td/Tdap (tetanus, diphtheria, pertussis) every 10 years Pneumonia (Prevnar 15 then Pneumovax 23 at least 1 year apart.  Alternatively, can take Prevnar 20 without needing additional dose) Shingrix: 2 doses from 4 weeks to 6 months apart  Please check with your PCP to make sure you are up to date.

## 2022-08-25 NOTE — Telephone Encounter (Signed)
See office note for details. Patient is in office for an appointment.

## 2022-08-26 ENCOUNTER — Inpatient Hospital Stay: Payer: Medicare HMO

## 2022-08-26 ENCOUNTER — Other Ambulatory Visit: Payer: Self-pay | Admitting: Orthopedic Surgery

## 2022-08-26 ENCOUNTER — Telehealth: Payer: Self-pay | Admitting: *Deleted

## 2022-08-26 DIAGNOSIS — M65841 Other synovitis and tenosynovitis, right hand: Secondary | ICD-10-CM | POA: Diagnosis not present

## 2022-08-26 DIAGNOSIS — R2231 Localized swelling, mass and lump, right upper limb: Secondary | ICD-10-CM | POA: Diagnosis not present

## 2022-08-26 NOTE — Telephone Encounter (Signed)
Attempted to contact the patient and left message to advise patient's wife that I spoke with [employer] and they advised they have not received the prescription as of yet. They advised patient to wait 24 to 48 hours to call so they can process prescription. Advised them to call back if they are not able to schedule shipment by Monday.

## 2022-09-02 ENCOUNTER — Inpatient Hospital Stay: Payer: Medicare HMO

## 2022-09-08 ENCOUNTER — Inpatient Hospital Stay: Payer: Medicare HMO

## 2022-09-16 ENCOUNTER — Inpatient Hospital Stay: Payer: Medicare HMO

## 2022-09-21 ENCOUNTER — Telehealth: Payer: Medicare HMO

## 2022-09-21 DIAGNOSIS — E1165 Type 2 diabetes mellitus with hyperglycemia: Secondary | ICD-10-CM | POA: Diagnosis not present

## 2022-09-21 DIAGNOSIS — D509 Iron deficiency anemia, unspecified: Secondary | ICD-10-CM | POA: Diagnosis not present

## 2022-09-21 DIAGNOSIS — E1140 Type 2 diabetes mellitus with diabetic neuropathy, unspecified: Secondary | ICD-10-CM | POA: Diagnosis not present

## 2022-09-21 DIAGNOSIS — N183 Chronic kidney disease, stage 3 unspecified: Secondary | ICD-10-CM | POA: Diagnosis not present

## 2022-09-21 DIAGNOSIS — E669 Obesity, unspecified: Secondary | ICD-10-CM | POA: Diagnosis not present

## 2022-09-21 DIAGNOSIS — E782 Mixed hyperlipidemia: Secondary | ICD-10-CM | POA: Diagnosis not present

## 2022-09-21 DIAGNOSIS — I10 Essential (primary) hypertension: Secondary | ICD-10-CM | POA: Diagnosis not present

## 2022-09-21 DIAGNOSIS — I6930 Unspecified sequelae of cerebral infarction: Secondary | ICD-10-CM | POA: Diagnosis not present

## 2022-09-21 DIAGNOSIS — M069 Rheumatoid arthritis, unspecified: Secondary | ICD-10-CM | POA: Diagnosis not present

## 2022-09-22 ENCOUNTER — Ambulatory Visit: Payer: Medicare HMO | Admitting: Pharmacist

## 2022-09-22 DIAGNOSIS — I10 Essential (primary) hypertension: Secondary | ICD-10-CM

## 2022-09-22 DIAGNOSIS — E1169 Type 2 diabetes mellitus with other specified complication: Secondary | ICD-10-CM

## 2022-09-22 DIAGNOSIS — E1140 Type 2 diabetes mellitus with diabetic neuropathy, unspecified: Secondary | ICD-10-CM

## 2022-09-22 MED ORDER — ESCITALOPRAM OXALATE 10 MG PO TABS: 10.0000 mg | ORAL_TABLET | Freq: Every day | ORAL | 0 refills | Status: DC

## 2022-09-22 NOTE — Progress Notes (Signed)
Pharmacy Note  09/22/2022 Name: Tommy Green MRN: [id number] DOB: [birth year]  Subjective: Tommy Green is a 76 y.o. year old male who is a primary care patient of [name], DO. Clinical Pharmacist Practitioner referral was placed to assist with medication management.    Engaged with patient by telephone for follow up visit today.  Patient had TAVR 01/07/2022 and was taking aspirin '81mg'$  daily however due to anemia,  aspirin was stopped / removed from med list by hematology 07/06/2022  DM, insulin dependent with CKD 3B:  Patient continues to follow up with Dr Leeroy Bock regularly. Last visit was last week (no notes available yet.  Patient continues to take [place] daily and Novolog per sliding scale. He checks blood glucose 3 to 4 times per day by fingerstick method.    Hypertension: Recent home blood pressure has been at goal, ranging form 120 to 130 / 80's. Patient endorses adherence to blood pressure medications. Reviewed refill history and has filled lisinopril and amlodipine on time for 2023 so far but he is due refills soon.  Hyperlipidemia: Patient restarted ezetimibe and is tolerating well. LDL has improved to 78 since ezetimibe was started but Tg continues to be > 400.  He has tried pravastatin, atorvastatin and lovastatin in past but stopped due to statin induced myalgias. Patient declines retrial of statin therapy. He is also taking fenofibrate '200mg'$  daily and over-the-counter omega 3 fatty acids '1000mg'$  daily.  He has tried higher doses of fish oil / omega 3 FA but has not been able to tolerate due to reflux belching   Recent Office Visits:  08/26/2022 Ortho (Dr Greta Doom) Right index finger PIP joint mass excision. Pathology report consistent with a ganglion cyst. No signs of malignancy.  08/25/2022 - Rheumatology Q[place]a Skye, [facility]) no med changes noted.  07/06/2022 - Hematology Eulas Post, NP) seen for iron deficiency anemia. Stopped Aspirin. Plans to start IV iron.   06/29/2022 - Cardio (Dr [name]) Labs checked - noted HGB and iron sat low - referred to hematology.   Objective: Review of patient status, including review of consultants reports, laboratory and other test data, was performed as part of comprehensive.  Lab Results  Component Value Date   CREATININE 1.87 (H) 08/17/2022   CREATININE 1.94 (H) 07/06/2022   CREATININE 1.77 (H) 06/29/2022    Lab Results  Component Value Date   HGBA1C 5.7 (H) 01/22/2022       Component Value Date/Time   CHOL 173 06/29/2022 1456   TRIG 446 (H) 06/29/2022 1456   HDL 24 (L) 06/29/2022 1456   CHOLHDL 7.2 (H) 06/29/2022 1456   CHOLHDL 7 05/04/2021 0852   VLDL 63.0 (H) 05/04/2021 0852   LDLCALC 78 06/29/2022 1456   LDLDIRECT 107.0 05/04/2021 0852     Clinical ASCVD: Yes  The ASCVD Risk score (Arnett DK, et al., 2019) failed to calculate for the following reasons:   The patient has a prior MI or stroke diagnosis    BP Readings from Last 3 Encounters:  08/25/22 (!) 163/74  08/18/22 120/65  08/17/22 (!) 154/62     Allergies  Allergen Reactions   Canagliflozin Other (See Comments)    Pt reports that the use of Invokana caused acute KIDNEY FAILURE ([facility] records) and "allergic," per [facility]   Aspirin Other (See Comments)    Gastroesophageal reflux and "allergic," per [facility]    Atorvastatin Other (See Comments)    Myalgias, GI upset, and "allergic," per [facility]   Statins Other (See Comments)  Muscle pain- "allergic," per [facility]    Medications Reviewed Today     Reviewed by [name], RPH-CPP (Pharmacist) on 09/22/22 at [phone]  Med List Status: <None>   Medication Order Taking? Sig Documenting Provider Last Dose Status Informant  acetaminophen (TYLENOL) 325 MG tablet [id number] Yes Take 650 mg by mouth every 6 (six) hours as needed for mild pain or headache. [provider] Taking Active Self  amLODipine (NORVASC) 5 MG tablet [id number] Yes Take 1 tablet (5 mg total) by mouth daily. [name], DO Taking Active Self  amoxicillin (AMOXIL) 500 MG tablet [id number] Yes Take 4 tablets (2,000 mg total) by mouth as directed. 1 HOUR PRIOR TO DENTAL APPOINTMENTS [name], MD Taking Active   B Complex Vitamins (B COMPLEX PO) [id number] Yes Take 1 tablet by mouth daily. [provider] Taking Active Self  Cholecalciferol (VITAMIN D3 SUPER STRENGTH) 50 MCG (2000 UT) CAPS [id number] Yes Take 2,000 Units by mouth daily. [provider] Taking Active Self  escitalopram (LEXAPRO) 10 MG tablet [id number] Yes Take 1 tablet (10 mg total) by mouth daily. [name], DO Taking Active   Etanercept (ENBREL MINI) 50 MG/ML SOCT [id number] Yes Inject 50 mg into the skin once a week. [name], PA-C Taking Active   ezetimibe (ZETIA) 10 MG tablet [id number] Yes Take 1 tablet (10 mg total) by mouth daily. For elevated cholesterol and triglycerides [name], DO Taking Active Self  fenofibrate micronized (LOFIBRA) 200 MG capsule [id number] Yes TAKE 1 CAPSULE DAILY BEFOREBREAKFAST [name], DO Taking Active   fluticasone (FLONASE) 50 MCG/ACT nasal spray [id number] Yes Place 2 sprays into both nostrils daily. [name], DO Taking Active            Med Note [name], Ahmiyah Coil B   Wed Sep 22, 2022  9:33 AM) Using as needed  furosemide (LASIX) 80 MG tablet [id number] Yes TAKE 1 TABLET BY MOUTH EVERY DAY [name], DO Taking Active   HYDROcodone-acetaminophen ([place]) 7.5-325 MG tablet [id number]  Take 1 tablet by mouth every 6 (six) hours as needed for moderate pain.  Patient not taking: Reported on 08/25/2022   [name], DO  Active   HYDROcodone-acetaminophen [facility]) 7.5-325 MG tablet [id number] Yes Take 1 tablet by mouth every 6 (six) hours as needed for moderate pain. [name], DO Taking Active   insulin aspart (NOVOLOG FLEXPEN) 100 UNIT/ML FlexPen [id number] Yes Inject 0-10 Units into the skin  See admin instructions. Inject 0-10 units into the skin before meals, PER SLIDING SCALE: BGL 0-200 = give nothing, 201-250 = 2 units, 251-300 = 4 units, 301-350 = 6 units, 351-400 = 8 units, >400 = 10 units, push fluids, and repeat BGL check in 2 hours. If BGL remains >400 after 2 hours, CALL MD. [provider] Taking Active Self  insulin degludec (TRESIBA) 100 UNIT/ML FlexTouch Pen [id number] Yes Inject 30 Units into the skin at bedtime. Donne Hazel, MD Taking Active Self  leflunomide (ARAVA) 10 MG tablet [id number] Yes Take 1 tablet (10 mg total) by mouth daily. [name], PA-C Taking Active   lisinopril (ZESTRIL) 40 MG tablet [id number] Yes Take 1 tablet (40 mg total) by mouth in the morning. [name], DO Taking Active Self  magnesium oxide (MAG-OX) 400 MG tablet [id number] Yes Take 400 mg by mouth daily with lunch. [provider] Taking Active Self  melatonin 5 MG TABS [id number] Yes Take 5 mg  by mouth at bedtime. [provider] Taking Active Self  Multiple Vitamins-Minerals (MULTIVITAMIN WITH MINERALS) tablet [id number] Yes Take 1 tablet by mouth daily with breakfast. [provider] Taking Active Self  Omega 3 1000 MG CAPS [id number] Yes Take 1,000 mg by mouth daily with lunch. [provider] Taking Active Self  omeprazole (PRILOSEC) 20 MG capsule [id number] Yes TAKE 1 CAPSULE DAILY BEFOREBREAKFAST [name], DO Taking Active   [facility] VERIO test strip [id number] Yes 4 (four) times daily. [provider] Taking Active Self  Potassium 99 MG TABS [id number] Yes Take 99 mg by mouth every evening. [provider] Taking Active Self  vitamin B-12 (CYANOCOBALAMIN) 1000 MCG tablet [id number] Yes Take 1,000 mcg by mouth daily. [provider] Taking Active Self  vitamin E 180 MG (400 UNITS) capsule [id number] Yes Take 400 Units by mouth daily. [provider] Taking Active Self             Patient Active Problem List   Diagnosis Date Noted   IDA (iron deficiency anemia) 07/06/2022   Current mild episode of major depressive disorder without prior episode ([place]) 05/17/2022   Other chronic pain 03/05/2022   Bilateral lower extremity edema 03/05/2022   Severe aortic stenosis 01/26/2022   S/P TAVR (transcatheter aortic valve replacement) 01/26/2022   Microcytic anemia 12/10/2021   Obesity (BMI 30-39.9) 12/10/2021   Near syncope 12/09/2021   Aortic stenosis 12/09/2021   Rheumatoid arthritis ([place]) 12/09/2021   Foot drop, left foot 10/02/2021   History of colonic polyps 10/02/2021   History of rheumatic fever 10/02/2021   Stress fracture of left foot 09/03/2021   Gabapentin overdose, accidental or unintentional, initial encounter 03/08/2021   TIA (transient ischemic attack) 03/05/2021   Body mass index (BMI) 36.0-36.9, adult 10/03/2020   Subdural hemorrhage following injury without open intracranial wound and with prolonged loss of consciousness (more than 24 hours) without return to pre-existing conscious level ([place]) 10/03/2020   AKI (acute kidney injury) ([place])    Acute encephalopathy    SDH (subdural hematoma) ([place]) 01/75/1025   Cyclic vomiting syndrome 08/06/2019   Neuropathy 12/25/2018   AK (actinic keratosis) 08/24/2018   Neoplasm of uncertain behavior 85/27/7824   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   Tendinopathy of right gluteus medius 07/13/2018   Tendinopathy of left gluteus medius 07/13/2018   Squamous cell carcinoma in situ (SCCIS) of skin 03/24/2018   Vitamin D deficiency 11/29/2017   Stage 3 chronic kidney disease ([place]) 10/28/2017   Primary osteoarthritis of right hip 09/11/2017   Essential hypertension 07/18/2017   Hyperlipidemia associated with type 2 diabetes mellitus ([place]) 07/18/2017   Type 2 diabetes mellitus with diabetic neuropathy, unspecified ([place]) 07/18/2017   Heart murmur 07/18/2017   Hypertriglyceridemia 07/18/2017   History of stroke 07/18/2017    DDD (degenerative disc disease), lumbar 06/15/2017   Iron deficiency anemia 06/15/2017   Stroke ([place]) 06/15/2017   Encounter for hepatitis C screening test for low risk patient 06/30/2016   Anemia 06/19/2016   Microalbuminuria due to type 2 diabetes mellitus ([place]) 03/19/2016   Hypertrophy of inferior nasal turbinate 12/20/2015   Disease of nasal cavity and sinuses 12/10/2015   Degenerative tear of acetabular labrum of left hip 11/04/2015   History of tobacco abuse 05/15/2015   Morbid (severe) obesity due to excess calories ([place]) 04/11/2015   Disorder of both eustachian tubes 01/27/2015   Maxillary sinusitis 01/13/2015   Lipoprotein deficiency 01/31/2014   Gastro-esophageal reflux disease without esophagitis 11/02/2013   Rheumatic fever  11/02/2013   Erectile dysfunction associated with type 2 diabetes mellitus ([place]) 07/31/2013   Type 2 diabetes mellitus with hyperglycemia, without long-term current use of insulin ([place]) 11/16/2012     Medication Assistance:   Patient gets [place] and [employer] thru [employer] medication assistance program - application handled by his endocrinologist office - Dr Meredith Pel.    Assessment / Plan: DM, insulin dependent: Last A1c on file was at goal  Continue current therapy and follow up with Dr Meredith Pel.  Discussed Continuous Glucose Monitor which would be an option for patient. He will think about it but is not interested today. He will discuss with endocrinologist.  Given patient has CKD - he might also benefit from SGLT2 therapy. ALso with past history of TIA/stroke GLP1 could also be an option. If he enrollw with Novo medication assistance program in 2024 Ozempic could be an option. He will discuss further with endocrinologist.  Hypertension: Last blood pressure at rheumatologist office was elevated but patient's home blood pressure has been at goal of < 130/80 Continue lisinopril and amlodipine - reminded that refills will be due soon.  Hyperlipidemia: With  history of TIA and DM - goal LDL < 55; Tg < 150 - not at goal. Noted to be statin intolerant.  Checked [employer] coverage of prescription Omega 3 agents but they are not covered. He might see less belching / reflux with prescription strength. Patient declines. Will continue omega 3 FA over-the-counter '1000mg'$  daily.  Also continue ezetimibe and fenofibrate. If LDL remains above goal - consider PCSK0 therapy.  Medication management:  Reviewed and updated medication list Reviewed refill history and adherence Updated Rx for escitalopram at [facility] mail order pharmacy for 90 day supply.   Health Maintenance:  Made appointment in our clinic to get flu vaccine 09/23/2022 after appt at cancer center for IV iron infusion.  Will request 2023 eye appoitment - per patient he last saw Dr Orvan Seen with [facility] care center in [facility] [phone]  Follow Up:  Telephone follow up appointment with care management team member scheduled for:  3 to 4 months.   [name], PharmD Clinical Pharmacist [place] [phone]

## 2022-09-23 ENCOUNTER — Ambulatory Visit (INDEPENDENT_AMBULATORY_CARE_PROVIDER_SITE_OTHER): Payer: Medicare HMO

## 2022-09-23 ENCOUNTER — Encounter: Payer: Self-pay | Admitting: Family Medicine

## 2022-09-23 ENCOUNTER — Inpatient Hospital Stay: Payer: Medicare HMO | Attending: Hematology & Oncology

## 2022-09-23 DIAGNOSIS — Z23 Encounter for immunization: Secondary | ICD-10-CM

## 2022-09-23 LAB — HEMOGLOBIN A1C: Hemoglobin A1C: 6.9

## 2022-09-30 ENCOUNTER — Inpatient Hospital Stay: Payer: Medicare HMO

## 2022-10-07 ENCOUNTER — Encounter: Payer: Self-pay | Admitting: Family Medicine

## 2022-10-07 ENCOUNTER — Telehealth: Payer: Self-pay

## 2022-10-07 ENCOUNTER — Inpatient Hospital Stay: Payer: Medicare HMO

## 2022-10-07 ENCOUNTER — Ambulatory Visit (INDEPENDENT_AMBULATORY_CARE_PROVIDER_SITE_OTHER): Payer: Medicare HMO | Admitting: Family Medicine

## 2022-10-07 VITALS — BP 113/58 | HR 83 | Temp 98.4°F | Resp 16 | Wt 235.0 lb

## 2022-10-07 DIAGNOSIS — N1832 Chronic kidney disease, stage 3b: Secondary | ICD-10-CM | POA: Diagnosis not present

## 2022-10-07 LAB — BASIC METABOLIC PANEL
BUN: 39 mg/dL — ABNORMAL HIGH (ref 6–23)
CO2: 23 mEq/L (ref 19–32)
Calcium: 9 mg/dL (ref 8.4–10.5)
Chloride: 105 mEq/L (ref 96–112)
Creatinine, Ser: 1.77 mg/dL — ABNORMAL HIGH (ref 0.40–1.50)
GFR: 36.28 mL/min — ABNORMAL LOW (ref >60.00)
Glucose, Bld: 151 mg/dL — ABNORMAL HIGH (ref 70–99)
Potassium: 4.8 mEq/L (ref 3.5–5.1)
Sodium: 137 mEq/L (ref 135–145)

## 2022-10-07 MED ORDER — DAPAGLIFLOZIN PROPANEDIOL 10 MG PO TABS: 10.0000 mg | ORAL_TABLET | Freq: Every day | ORAL | 5 refills | Status: DC

## 2022-10-07 NOTE — Telephone Encounter (Signed)
I spoke with the patient.  I explained to him that several medications can contribute to dry mouth especially Lexapro and hydrocodone.  If he prefers to come off leflunomide he can and if his arthritis get worse then we can restart leflunomide or add some other medication.  Patient voiced understanding.  He should try to use over-the-counter products for dry mouth.

## 2022-10-07 NOTE — Telephone Encounter (Signed)
Patient wife called requesting he STOP leflunomide (ARAVA) 10 MG tablet due to dry mouth and the fact that dentist states this is causing caps and teeth to fall out, he now has to have 6 teeth removed and they can not afford to keep going to oral surgeon. She asked what will happen if she takes him off of this medication or can he switch to a med that does not cause dry mouth. Advised will call tomorrow after I address this with provider. [phone].

## 2022-10-07 NOTE — Progress Notes (Signed)
Chief Complaint  Patient presents with   Follow-up    "To go over labs done at endocrinology"     Subjective: Patient is a 76 y.o. male here for f/u labs. Here w his wife Fraser Din.   Patient is GFR is in the stage IIIb category.  He had a creatinine bump previously on Invokana.  He is currently taking lisinopril 40 mg daily.  He does stay well-hydrated.  His urine is clear with a yellow tint.  He has no pain and is neuro.  Past Medical History:  Diagnosis Date   Anemia    Essential hypertension 07/18/2017   Foot drop, left foot    due to back surgery in 01/2021   GERD (gastroesophageal reflux disease)    History of colonic polyps    History of hiatal hernia    many years ago   History of rheumatic fever    History of stroke 07/18/2017   Hypertriglyceridemia 07/18/2017   S/P TAVR (transcatheter aortic valve replacement) 01/26/2022   [name] 50m via TF approach with Dr. [name] Dr. BCyndia Bent  Severe aortic stenosis    Type 2 diabetes mellitus with diabetic neuropathy, unspecified ([place] 07/18/2017    Objective: BP (!) 113/58   Pulse 83   Temp 98.4 F (36.9 C) (Oral)   Resp 16   Wt 235 lb (106.6 kg)   SpO2 95%   BMI 32.78 kg/m  General: Awake, appears stated age Heart: RRR, 1+ pitting bilateral LE edema tapering at the proximal third of the tibia Mouth: MMM Lungs: CTAB, no rales, wheezes or rhonchi. No accessory muscle use Psych: Age appropriate judgment and insight, normal affect and mood  Assessment and Plan: Stage 3b chronic kidney disease ([place] - Plan: Basic metabolic panel, dapagliflozin propanediol (FARXIGA) 10 MG TABS tablet, Basic metabolic panel, Basic metabolic panel  Chronic, not fully controlled.  Continue lisinopril 40 mg daily.  Check above.  Start Farxiga 10 mg daily.  He will monitor sugars but is only on a sliding scale right now.  A1c is well-controlled.  Stay hydrated.  Check BMP in 1 week.  I do expect his slight creatinine bump.  I will see him in  2 months. The patient and his spouse voiced understanding and agreement to the plan.  [place] DO 10/07/22  1:28 PM

## 2022-10-07 NOTE — Patient Instructions (Signed)
Give [place] 2-3 business days to get the results of your labs back.   Try some hydrocortisone cream 1% twice daily for 2 weeks on his legs and feet. If that does not work, consider coconut oil twice daily for a month. If still having issues, I want to see him to consider a biopsy.  Stay hydrated.  Let us know if you need anything.

## 2022-10-12 ENCOUNTER — Ambulatory Visit: Payer: Medicare HMO | Admitting: Family

## 2022-10-12 ENCOUNTER — Other Ambulatory Visit: Payer: Medicare HMO

## 2022-10-14 ENCOUNTER — Other Ambulatory Visit (INDEPENDENT_AMBULATORY_CARE_PROVIDER_SITE_OTHER): Payer: Medicare HMO

## 2022-10-14 DIAGNOSIS — N1832 Chronic kidney disease, stage 3b: Secondary | ICD-10-CM | POA: Diagnosis not present

## 2022-10-14 LAB — BASIC METABOLIC PANEL
BUN: 37 mg/dL — ABNORMAL HIGH (ref 6–23)
CO2: 23 mEq/L (ref 19–32)
Calcium: 8.9 mg/dL (ref 8.4–10.5)
Chloride: 106 mEq/L (ref 96–112)
Creatinine, Ser: 2.03 mg/dL — ABNORMAL HIGH (ref 0.40–1.50)
GFR: 30.77 mL/min — ABNORMAL LOW (ref >60.00)
Glucose, Bld: 175 mg/dL — ABNORMAL HIGH (ref 70–99)
Potassium: 4.7 mEq/L (ref 3.5–5.1)
Sodium: 138 mEq/L (ref 135–145)

## 2022-10-14 NOTE — Addendum Note (Signed)
Addended by: Manuela Schwartz on: 10/14/2022 10:56 AM   Modules accepted: Orders

## 2022-10-14 NOTE — Addendum Note (Signed)
Addended by: Manuela Schwartz on: 10/14/2022 10:53 AM   Modules accepted: Orders

## 2022-10-24 ENCOUNTER — Telehealth: Payer: Self-pay | Admitting: Pharmacist

## 2022-10-24 NOTE — Telephone Encounter (Signed)
Submitted a Prior Authorization RENEWAL request to [facility] for ENBREL via CoverMyMeds. Will update once we receive a response.  Key: [id number]  Knox Saliva, PharmD, MPH, BCPS, CPP Clinical Pharmacist (Rheumatology and Pulmonology)

## 2022-10-24 NOTE — Telephone Encounter (Signed)
Received notification from [facility] regarding a prior authorization for ENBREL. Authorization has been APPROVED from 10/18/2022 to 10/18/2023. Approval letter sent to scan center.  Authorization # [id number]  Knox Saliva, PharmD, MPH, BCPS, CPP Clinical Pharmacist (Rheumatology and Pulmonology)

## 2022-10-25 ENCOUNTER — Encounter: Payer: Self-pay | Admitting: Cardiology

## 2022-10-25 NOTE — Telephone Encounter (Signed)
Error

## 2022-10-28 ENCOUNTER — Telehealth: Payer: Self-pay

## 2022-10-28 NOTE — Telephone Encounter (Signed)
   Pre-operative Risk Assessment    Patient Name: Tommy Green  DOB: 09-22-1946 MRN: [id number]     Request for Surgical Clearance{ 1. What type of surgery is being performed? Enter name of procedure below and number of teeth if dental extraction.   Procedure:  Dental Extraction - Amount of Teeth to be Pulled:  6   2. When is this surgery scheduled? Press F2 to enter date below and place date in Reason for Visit (see directions below).  Date of Surgery:  Clearance TBD                               3. What is the name of the Surgeon, the Surgeon's Group or Practice, phone and fax number?  Press F2 and list below :1}  Surgeon:  Dr Natividad Brood Surgeon's Group or Practice Name:  [facility] Phone number:  [phone] Fax number:  [phone]   Type of Clearance Requested:   - Medical   Local Type of Anesthesia:  Local  6. Are there any other requests or questions from the surgeon?    :1}  Additional requests/questions:   What is patients pre & post op prophylaxis regimen ?  Signed, Toni Arthurs   10/28/2022, 7:36 AM

## 2022-10-28 NOTE — Telephone Encounter (Signed)
   Primary Cardiologist: Shirlee More, MD  Chart reviewed as part of pre-operative protocol coverage. He has a history of taking aspirin for stroke. If requesting provider wants patient to hold aspirin, clearance will need to be forwarded to primary care or neurology, whichever has been prescribing.   SBE prophylaxis IS  required for the patient. He has a prescription for Amoxicillin 2000 mg to be taken 1 hour prior to dental appointments.   I will route this recommendation to the requesting party via Epic fax function and remove from pre-op pool.  Please call with questions.  Emmaline Life, NP-C  10/28/2022, 8:02 AM [address] Office [phone] Fax [phone]

## 2022-11-06 ENCOUNTER — Other Ambulatory Visit: Payer: Self-pay | Admitting: Family Medicine

## 2022-11-08 ENCOUNTER — Other Ambulatory Visit: Payer: Self-pay | Admitting: Family Medicine

## 2022-11-08 ENCOUNTER — Telehealth: Payer: Self-pay | Admitting: Family Medicine

## 2022-11-08 MED ORDER — HYDROCODONE-ACETAMINOPHEN 7.5-325 MG PO TABS: 1.0000 | ORAL_TABLET | Freq: Four times a day (QID) | ORAL | 0 refills | Status: DC | PRN

## 2022-11-08 NOTE — Telephone Encounter (Signed)
Last OV----10/07/2022 Last RF-----10/03/2022

## 2022-11-08 NOTE — Telephone Encounter (Signed)
Prescription Request  11/08/2022  Is this a "Controlled Substance" medicine? Yes  LOV: 10/07/2022  What is the name of the medication or equipment?   HYDROcodone-acetaminophen (NORCO) 7.5-325 MG tablet  Have you contacted your pharmacy to request a refill? No   Which pharmacy would you like this sent to?  [id number] [place] [place] [facility] [address] [place] [address]Phone: [phone]: [phone]    Patient notified that their request is being sent to the clinical staff for review and that they should receive a response within 2 business days.   Please advise at [phone]

## 2022-11-11 ENCOUNTER — Other Ambulatory Visit: Payer: Self-pay | Admitting: Orthopedic Surgery

## 2022-11-11 DIAGNOSIS — M65842 Other synovitis and tenosynovitis, left hand: Secondary | ICD-10-CM | POA: Diagnosis not present

## 2022-11-11 DIAGNOSIS — R2232 Localized swelling, mass and lump, left upper limb: Secondary | ICD-10-CM | POA: Diagnosis not present

## 2022-12-05 ENCOUNTER — Other Ambulatory Visit: Payer: Self-pay | Admitting: Family Medicine

## 2022-12-08 ENCOUNTER — Ambulatory Visit (INDEPENDENT_AMBULATORY_CARE_PROVIDER_SITE_OTHER): Payer: Medicare HMO | Admitting: Family Medicine

## 2022-12-08 ENCOUNTER — Encounter: Payer: Self-pay | Admitting: Family Medicine

## 2022-12-08 VITALS — BP 122/60 | HR 105 | Temp 98.0°F | Ht 71.0 in | Wt 224.0 lb

## 2022-12-08 DIAGNOSIS — J069 Acute upper respiratory infection, unspecified: Secondary | ICD-10-CM | POA: Diagnosis not present

## 2022-12-08 DIAGNOSIS — I10 Essential (primary) hypertension: Secondary | ICD-10-CM | POA: Diagnosis not present

## 2022-12-08 DIAGNOSIS — N1832 Chronic kidney disease, stage 3b: Secondary | ICD-10-CM | POA: Diagnosis not present

## 2022-12-08 MED ORDER — FINERENONE 10 MG PO TABS: 10.0000 mg | ORAL_TABLET | Freq: Every day | ORAL | 3 refills | Status: DC

## 2022-12-08 MED ORDER — HYDROCODONE-ACETAMINOPHEN 7.5-325 MG PO TABS: 1.0000 | ORAL_TABLET | Freq: Four times a day (QID) | ORAL | 0 refills | Status: DC | PRN

## 2022-12-08 MED ORDER — PROMETHAZINE-DM 6.25-15 MG/5ML PO SYRP: 5.0000 mL | ORAL_SOLUTION | Freq: Every evening | ORAL | 0 refills | Status: DC | PRN

## 2022-12-08 NOTE — Progress Notes (Signed)
Chief Complaint  Patient presents with   Follow-up    Stopped [place] Glade was causing yeast infections. Sinus infection  Refill Hydrocodone   Subjective Tommy Green is a 77 y.o. male who presents for hypertension follow up. He does monitor home blood pressures. Blood pressures ranging from 120's/70's on average. He is compliant with medication-lisinopril 40 mg daily, amlodipine 5 mg daily. Patient has these side effects of medication: none He is usually adhering to a healthy diet overall. Current exercise: None No chest pain or shortness of breath.  CKD 3B- started on Farxiga 10 mg daily.  He reported compliance.  He was getting genital yeast infections and stopped the medication.  No issues since stopping.  He is on lisinopril as noted above.   URI Duration: 10 days; slight improvement Associated symptoms: sinus congestion, rhinorrhea, and slight cough and fatigue Denies: sinus pain, itchy watery eyes, ear fullness, ear pain, ear drainage, sore throat, wheezing, shortness of breath, myalgia, and fevers, N/V/D Treatment to date: none Sick contacts: No   Past Medical History:  Diagnosis Date   Anemia    Essential hypertension 07/18/2017   Foot drop, left foot    due to back surgery in 01/2021   GERD (gastroesophageal reflux disease)    History of colonic polyps    History of hiatal hernia    many years ago   History of rheumatic fever    History of stroke 07/18/2017   Hypertriglyceridemia 07/18/2017   S/P TAVR (transcatheter aortic valve replacement) 01/26/2022   [name] 56m via TF approach with Dr. [name] Dr. BCyndia Bent  Severe aortic stenosis    Type 2 diabetes mellitus with diabetic neuropathy, unspecified ([place] 07/18/2017    Exam BP 122/60 (BP Location: Left Arm, Cuff Size: Large)   Pulse (!) 105   Temp 98 F (36.7 C) (Oral)   Ht 5' 11"$  (1.803 m)   Wt 224 lb (101.6 kg)   SpO2 97%   BMI 31.24 kg/m  General:  well developed, well nourished, in no  apparent distress HEENT: MMM, no pharyngeal exudate or erythema, nares are patent without rhinorrhea, ear canals are patent without otorrhea, TMs negative bilaterally, no sinus TTP Heart: Regular rhythm, tachycardic, no bruits, no LE edema Lungs: clear to auscultation, no accessory muscle use Psych: well oriented with normal range of affect and appropriate judgment/insight  Essential hypertension  Stage 3b chronic kidney disease ([place] - Plan: Finerenone 10 MG TABS  Viral URI with cough - Plan: promethazine-dextromethorphan (PROMETHAZINE-DM) 6.25-15 MG/5ML syrup  Chronic, stable.  Continue lisinopril 40 mg daily, amlodipine 5 mg daily.  Counseled on diet and exercise. Adverse effect of chronic medication.  Stop FWilder Glade continue lisinopril as above.  Start finerenone 10 mg daily. Cough syrup as needed at night.  Warned about drowsiness.  He has tolerated this well in the past. F/u in 6 mo. The patient voiced understanding and agreement to the plan.  [place] DO 12/08/22  11:55 AM

## 2022-12-08 NOTE — Patient Instructions (Signed)
Let me know if there are cost issues with the new medicine.  Continue to push fluids, practice good hand hygiene, and cover your mouth if you cough.  If you start having fevers, shaking or shortness of breath, seek immediate care.  Let us know if you need anything.

## 2022-12-16 ENCOUNTER — Other Ambulatory Visit: Payer: Self-pay | Admitting: *Deleted

## 2022-12-16 ENCOUNTER — Other Ambulatory Visit: Payer: Self-pay

## 2022-12-16 DIAGNOSIS — Z79899 Other long term (current) drug therapy: Secondary | ICD-10-CM

## 2022-12-16 DIAGNOSIS — M0579 Rheumatoid arthritis with rheumatoid factor of multiple sites without organ or systems involvement: Secondary | ICD-10-CM

## 2022-12-16 NOTE — Telephone Encounter (Signed)
Patient had stopped the [place] due to thinking it was causing dry mouth and causing the caps on his teeth to fall out and having to have teeth pulled. Patient was advised if he started having pain from his arthritis again to resume the [place]. He did resume the [place]. Advised patient's wife [name] that Galen should come into the office for labs prior to sending the refill as his kidney function has been fluctuating and we need to see what tat is prior to refilling his medication. She expressed understanding and states she will bring him this afternoon to have his labs done.

## 2022-12-16 NOTE — Telephone Encounter (Signed)
Next Visit: 01/27/2023  Last Visit: 08/25/2022  Last Fill: 07/01/2022  DX: Rheumatoid arthritis involving multiple sites with positive rheumatoid factor   Current Dose per office note 08/25/2022: Arava 10 mg 1 tablet by mouth daily   Labs: 09/21/2022 Glucose 140, Creat. 1.86 GFR 36, BUN 37, Hct 32.3, RDW 17.0  Okay to refill Arava?

## 2022-12-16 NOTE — Telephone Encounter (Signed)
Patient's wife contacted the office and requested for a refill of the patient's Leflunomide '10mg'$  to be sent to [facility]. Patient's wife stated that the patient has ten pills left. The call back number is [phone]. Thanks.

## 2022-12-16 NOTE — Telephone Encounter (Signed)
Reviewed telephone encounter from 10/07/2022 at which time the patient was considering discontinuing Arava.  Please clarify if he is taking arava still?

## 2022-12-17 ENCOUNTER — Other Ambulatory Visit: Payer: Self-pay | Admitting: *Deleted

## 2022-12-17 DIAGNOSIS — M0579 Rheumatoid arthritis with rheumatoid factor of multiple sites without organ or systems involvement: Secondary | ICD-10-CM

## 2022-12-17 LAB — CBC WITH DIFFERENTIAL/PLATELET
Absolute Monocytes: 756 cells/uL (ref 200–950)
Basophils Absolute: 84 cells/uL (ref 0–200)
Basophils Relative: 0.8 %
Eosinophils Absolute: 252 cells/uL (ref 15–500)
Eosinophils Relative: 2.4 %
HCT: 29.5 % — ABNORMAL LOW (ref 38.5–50.0)
Hemoglobin: 9.8 g/dL — ABNORMAL LOW (ref 13.2–17.1)
Lymphs Abs: 2394 cells/uL (ref 850–3900)
MCH: 28.2 pg (ref 27.0–33.0)
MCHC: 33.2 g/dL (ref 32.0–36.0)
MCV: 85 fL (ref 80.0–100.0)
MPV: 10.3 fL (ref 7.5–12.5)
Monocytes Relative: 7.2 %
Neutro Abs: 7014 cells/uL (ref 1500–7800)
Neutrophils Relative %: 66.8 %
Platelets: 303 10*3/uL (ref 140–400)
RBC: 3.47 10*6/uL — ABNORMAL LOW (ref 4.20–5.80)
RDW: 13.3 % (ref 11.0–15.0)
Total Lymphocyte: 22.8 %
WBC: 10.5 10*3/uL (ref 3.8–10.8)

## 2022-12-17 LAB — COMPLETE METABOLIC PANEL WITH GFR
AG Ratio: 1.5 (calc) (ref 1.0–2.5)
ALT: 9 U/L (ref 9–46)
AST: 13 U/L (ref 10–35)
Albumin: 3.8 g/dL (ref 3.6–5.1)
Alkaline phosphatase (APISO): 41 U/L (ref 35–144)
BUN/Creatinine Ratio: 18 (calc) (ref 6–22)
BUN: 30 mg/dL — ABNORMAL HIGH (ref 7–25)
CO2: 22 mmol/L (ref 20–32)
Calcium: 8.8 mg/dL (ref 8.6–10.3)
Chloride: 106 mmol/L (ref 98–110)
Creat: 1.7 mg/dL — ABNORMAL HIGH (ref 0.70–1.28)
Globulin: 2.6 g/dL (calc) (ref 1.9–3.7)
Glucose, Bld: 172 mg/dL — ABNORMAL HIGH (ref 65–99)
Potassium: 4.8 mmol/L (ref 3.5–5.3)
Sodium: 137 mmol/L (ref 135–146)
Total Bilirubin: 0.3 mg/dL (ref 0.2–1.2)
Total Protein: 6.4 g/dL (ref 6.1–8.1)
eGFR: 41 mL/min/{1.73_m2} — ABNORMAL LOW (ref >=60)

## 2022-12-17 MED ORDER — LEFLUNOMIDE 10 MG PO TABS: 10.0000 mg | ORAL_TABLET | Freq: Every day | ORAL | 0 refills | Status: DC

## 2022-12-17 NOTE — Progress Notes (Signed)
Glucose is elevated.  Creatinine is elevated and stable.  Hemoglobin is low and stable.  Please forward results to his PCP.

## 2022-12-17 NOTE — Telephone Encounter (Signed)
Next Visit: 01/27/2023  Last Visit: 08/25/2022  Last Fill: 07/01/2022  DX: Rheumatoid arthritis involving multiple sites with positive rheumatoid factor   Current Dose per office note 08/25/2022: Arava 10 mg 1 tablet by mouth daily   Labs: 12/16/2022 Glucose is elevated.  Creatinine is elevated and stable.  Hemoglobin is low and stable.  Please forward results to his PCP.   Okay to refill Arava?

## 2022-12-20 ENCOUNTER — Telehealth: Payer: Self-pay | Admitting: Pharmacist

## 2022-12-20 NOTE — Telephone Encounter (Signed)
Received signed provider form. Submitted Patient Assistance RENEWAL Application to [employer] for ENBREL along with provider portion, patient portion, PA, med list, insurance card copy, and income documents. Will update patient when we receive a response.  Fax# [phone] Phone# [phone]  Knox Saliva, PharmD, MPH, BCPS, CPP Clinical Pharmacist (Rheumatology and Pulmonology)

## 2022-12-20 NOTE — Telephone Encounter (Signed)
Received [employer] Enbrel PAP renewal application from patient. Pending provider portion - placed in Hazel Sams, PA-C folder for signature  Knox Saliva, PharmD, MPH, BCPS, CPP Clinical Pharmacist (Rheumatology and Pulmonology)

## 2022-12-23 DIAGNOSIS — Z86008 Personal history of in-situ neoplasm of other site: Secondary | ICD-10-CM | POA: Diagnosis not present

## 2022-12-23 DIAGNOSIS — C4361 Malignant melanoma of right upper limb, including shoulder: Secondary | ICD-10-CM | POA: Diagnosis not present

## 2022-12-23 DIAGNOSIS — L820 Inflamed seborrheic keratosis: Secondary | ICD-10-CM | POA: Diagnosis not present

## 2022-12-23 DIAGNOSIS — H61002 Unspecified perichondritis of left external ear: Secondary | ICD-10-CM | POA: Diagnosis not present

## 2022-12-23 DIAGNOSIS — L821 Other seborrheic keratosis: Secondary | ICD-10-CM | POA: Diagnosis not present

## 2022-12-23 DIAGNOSIS — D485 Neoplasm of uncertain behavior of skin: Secondary | ICD-10-CM | POA: Diagnosis not present

## 2022-12-23 DIAGNOSIS — Z859 Personal history of malignant neoplasm, unspecified: Secondary | ICD-10-CM | POA: Diagnosis not present

## 2022-12-30 ENCOUNTER — Ambulatory Visit: Payer: Medicare HMO | Admitting: Cardiology

## 2023-01-04 NOTE — Telephone Encounter (Signed)
Spoke with Enbrel patient assistance representative regarding the renewal application status. Case will be moving over to processing at this time. This typically takes 2 business days for determination.    Maryan Puls, PharmD PGY-1 [facility] Pharmacy Resident

## 2023-01-05 NOTE — Telephone Encounter (Signed)
Received a fax from  [place] regarding an approval for ENBREL patient assistance from 01/05/2023 to 10/18/2023. Approval letter sent to scan center.  Phone #: [phone] Fax #: [phone]  Knox Saliva, PharmD, MPH, BCPS, CPP Clinical Pharmacist (Rheumatology and Pulmonology)

## 2023-01-06 ENCOUNTER — Ambulatory Visit (INDEPENDENT_AMBULATORY_CARE_PROVIDER_SITE_OTHER): Payer: Medicare HMO | Admitting: Pharmacist

## 2023-01-06 ENCOUNTER — Telehealth: Payer: Self-pay | Admitting: Pharmacist

## 2023-01-06 ENCOUNTER — Encounter: Payer: Self-pay | Admitting: Family Medicine

## 2023-01-06 DIAGNOSIS — Z0100 Encounter for examination of eyes and vision without abnormal findings: Secondary | ICD-10-CM | POA: Diagnosis not present

## 2023-01-06 DIAGNOSIS — I10 Essential (primary) hypertension: Secondary | ICD-10-CM

## 2023-01-06 DIAGNOSIS — H524 Presbyopia: Secondary | ICD-10-CM | POA: Diagnosis not present

## 2023-01-06 DIAGNOSIS — E785 Hyperlipidemia, unspecified: Secondary | ICD-10-CM

## 2023-01-06 DIAGNOSIS — E1169 Type 2 diabetes mellitus with other specified complication: Secondary | ICD-10-CM

## 2023-01-06 LAB — HM DIABETES EYE EXAM

## 2023-01-06 NOTE — Progress Notes (Signed)
Pharmacy Note  01/06/2023 Name: Akshar Blase Guerrieri MRN: [id number] DOB: 04-10-1946  Subjective: Tommy Green is a 77 y.o. year old male who is a primary care patient of [name], DO. Clinical Pharmacist Practitioner referral was placed to assist with medication management.    Engaged with patient by telephone for follow up visit today.   DM, insulin dependent with CKD 3B:   Patient continues to follow up with Dr Meredith Pel who is now at [facility]. His last appointment was in Feb 2024. No notes on file.   Current treatment: Tresiba daily and Novolog per sliding scale.  He checks blood glucose 3 to 4 times per day by fingerstick method. Uses Relion glucometer.    Hypertension:  Current therapy: lisinopril and amlodipine Recent home blood pressure has been at goal, ranging form 120 to 130 / 65 to 75.  Patient endorses adherence to blood pressure medications.  Denies dizziness, chest pain or headache.    Hyperlipidemia:  Current therapy: ezetimibe 10mg  daily, fenofibrate 200mg  daily and omega 3 fatty acids 1000mg  daily (has not been able to tolerate higher doses of omega 3 FAs due to reflux and belching Past therapy: pravastatin, atorvastatin and lovastatin in past but stopped due to statin induced myalgias. Patient declines retrial of statin therapy.   CKD -  Was prescribed Carrington Clamp / finerenone by Dr Nani Ravens but per patient he never started due to cost. [facility] was $47 per 30 days but he would likely reach coverage gap. We would try to see if he could qualify for medication assistance program.  Unfortunately he has tried SGLT2 in past and has acute kidney failure that was thought to have related to start of SGLTs.   He is currently receiving Carney Bern, Tyler Aas and novolog thru medication assistance program.   Objective: Review of patient status, including review of consultants reports, laboratory and other test data, was performed as part of  comprehensive.  Lab Results  Component Value Date   CREATININE 1.70 (H) 12/16/2022   CREATININE 2.03 (H) 10/14/2022   CREATININE 1.77 (H) 10/07/2022    Lab Results  Component Value Date   HGBA1C 5.7 (H) 01/22/2022       Component Value Date/Time   CHOL 173 06/29/2022 1456   TRIG 446 (H) 06/29/2022 1456   HDL 24 (L) 06/29/2022 1456   CHOLHDL 7.2 (H) 06/29/2022 1456   CHOLHDL 7 05/04/2021 0852   VLDL 63.0 (H) 05/04/2021 0852   LDLCALC 78 06/29/2022 1456   LDLDIRECT 107.0 05/04/2021 0852     Clinical ASCVD: Yes  The ASCVD Risk score (Arnett DK, et al., 2019) failed to calculate for the following reasons:   The patient has a prior MI or stroke diagnosis    BP Readings from Last 3 Encounters:  12/08/22 122/60  10/07/22 (!) 113/58  08/25/22 (!) 163/74     Allergies  Allergen Reactions   Canagliflozin Other (See Comments)    Pt reports that the use of Invokana caused acute KIDNEY FAILURE ([facility] records) and "allergic," per [facility]   Aspirin Other (See Comments)    Gastroesophageal reflux and "allergic," per MAR    Atorvastatin Other (See Comments)    Myalgias, GI upset, and "allergic," per MAR   Statins Other (See Comments)    Muscle pain- "allergic," per [facility]    Medications Reviewed Today     Reviewed by [name], RPH-CPP (Pharmacist) on 01/06/23 at [place] List Status: <None>   Medication  Order Taking? Sig Documenting Provider Last Dose Status Informant  acetaminophen (TYLENOL) 325 MG tablet [id number] Yes Take 650 mg by mouth every 6 (six) hours as needed for mild pain or headache. [provider] Taking Active Self  amLODipine (NORVASC) 5 MG tablet [id number] Yes TAKE 1 TABLET DAILY [name], DO Taking Active   amoxicillin (AMOXIL) 500 MG tablet [id number] Yes Take 4 tablets (2,000 mg total) by mouth as directed. 1 HOUR PRIOR TO DENTAL APPOINTMENTS [name], MD Taking Active   B Complex Vitamins (B COMPLEX PO) [id number] Yes Take 1  tablet by mouth daily. [provider] Taking Active Self  Cholecalciferol (VITAMIN D3 SUPER STRENGTH) 50 MCG (2000 UT) CAPS [id number] Yes Take 2,000 Units by mouth daily. [provider] Taking Active Self  escitalopram (LEXAPRO) 10 MG tablet [id number] Yes Take 1 tablet (10 mg total) by mouth daily. [name], DO Taking Active   Etanercept (ENBREL MINI) 50 MG/ML SOCT [id number] Yes Inject 50 mg into the skin once a week. [name], PA-C Taking Active   ezetimibe (ZETIA) 10 MG tablet [id number] Yes TAKE 1 TABLET DAILY FOR    ELEVATED CHOLESTEROL AND   TRIGLYCERIDES [name], DO Taking Active   fenofibrate micronized (LOFIBRA) 200 MG capsule [id number] Yes TAKE 1 CAPSULE DAILY BEFOREBREAKFAST [name], DO Taking Active   Finerenone 10 MG TABS [id number]  Take 1 tablet (10 mg total) by mouth daily. [name], DO  Active   fluticasone [facility]) 50 MCG/ACT nasal spray [id number] Yes Place 2 sprays into both nostrils daily. [name], DO Taking Active            Med Note [name], Allan Minotti B   Wed Sep 22, 2022  9:33 AM) Using as needed  furosemide (LASIX) 80 MG tablet [id number] No TAKE 1 TABLET BY MOUTH EVERY DAY  Patient not taking: Reported on 01/06/2023   [name], DO Not Taking Active   gabapentin (NEURONTIN) 100 MG capsule [id number] Yes TAKE 2 CAPSULES AT BEDTIME [name], DO Taking Active   HYDROcodone-acetaminophen (NORCO) 7.5-325 MG tablet [id number] Yes Take 1 tablet by mouth every 6 (six) hours as needed for moderate pain. [name], DO Taking Active   HYDROcodone-acetaminophen ([place]) 7.5-325 MG tablet [id number]  Take 1 tablet by mouth every 6 (six) hours as needed for moderate pain. [name], DO  Active   HYDROcodone-acetaminophen (NORCO) 7.5-325 MG tablet [id number]  Take 1 tablet by mouth every 6 (six) hours as needed for moderate pain. [name], DO  Active   insulin aspart (NOVOLOG FLEXPEN) 100 UNIT/ML FlexPen [id number] Yes Inject 0-10 Units into the skin See admin instructions. Inject 0-10 units into the skin before meals, PER SLIDING SCALE: BGL 0-200 = give nothing, 201-250 = 2 units, 251-300 = 4 units, 301-350 = 6 units, 351-400 = 8 units, >400 = 10 units, push fluids, and repeat BGL check in 2 hours. If BGL remains >400 after 2 hours, CALL MD. [provider] Taking Active Self  leflunomide (ARAVA) 10 MG tablet [id number] Yes Take 1 tablet (10 mg total) by mouth daily. Bo Merino, MD Taking Active   lisinopril (ZESTRIL) 40 MG tablet [id number] Yes TAKE 1 TABLET IN THE       MORNING Wendling, Crosby Oyster, DO Taking Active   magnesium oxide (MAG-OX) 400 MG tablet [id number] Yes Take 400 mg by mouth daily with lunch. [provider] Taking Active Self  melatonin 5 MG TABS [id number] Yes Take 5 mg by mouth at bedtime. [provider] Taking Active Self  Multiple Vitamins-Minerals (MULTIVITAMIN WITH MINERALS) tablet [id number] Yes Take 1 tablet by mouth daily with breakfast. [provider] Taking Active Self  Omega 3 1000 MG CAPS [id number] Yes Take 1,000 mg by mouth daily with lunch. [provider] Taking Active Self  omeprazole (PRILOSEC) 20 MG capsule [id number] Yes TAKE 1 CAPSULE DAILY BEFOREBREAKFAST [name], DO Taking Active   [facility] VERIO test strip [id number] No 4 (four) times daily.  Patient not taking: Reported on 01/06/2023   [provider] Not Taking Active Self           Med Note Jonathon Jordan Jan 06, 2023  9:32 AM) Per patient he has Relion meter  Potassium 99 MG TABS [id number] Yes Take 99 mg by mouth every evening. [provider] Taking Active Self  vitamin B-12 (CYANOCOBALAMIN) 1000 MCG tablet [id number] Yes Take 1,000 mcg by mouth daily. [provider] Taking Active Self  vitamin E 180 MG (400 UNITS) capsule  [id number] Yes Take 400 Units by mouth daily. [provider] Taking Active Self            Patient Active Problem List   Diagnosis Date Noted   IDA (iron deficiency anemia) 07/06/2022   Current mild episode of major depressive disorder without prior episode ([place]) 05/17/2022   Other chronic pain 03/05/2022   Bilateral lower extremity edema 03/05/2022   Severe aortic stenosis 01/26/2022   S/P TAVR (transcatheter aortic valve replacement) 01/26/2022   Microcytic anemia 12/10/2021   Obesity (BMI 30-39.9) 12/10/2021   Near syncope 12/09/2021   Aortic stenosis 12/09/2021   Rheumatoid arthritis ([place]) 12/09/2021   Foot drop, left foot 10/02/2021   History of colonic polyps 10/02/2021   History of rheumatic fever 10/02/2021   Stress fracture of left foot 09/03/2021   Gabapentin overdose, accidental or unintentional, initial encounter 03/08/2021   TIA (transient ischemic attack) 03/05/2021   Body mass index (BMI) 36.0-36.9, adult 10/03/2020   Subdural hemorrhage following injury without open intracranial wound and with prolonged loss of consciousness (more than 24 hours) without return to pre-existing conscious level ([place]) 10/03/2020   AKI (acute kidney injury) ([place])    Acute encephalopathy    SDH (subdural hematoma) ([place]) 75/07/2584   Cyclic vomiting syndrome 08/06/2019   Neuropathy 12/25/2018   AK (actinic keratosis) 08/24/2018   Neoplasm of uncertain behavior 27/78/2423   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   Tendinopathy of right gluteus medius 07/13/2018   Tendinopathy of left gluteus medius 07/13/2018   Squamous cell carcinoma in situ (SCCIS) of skin 03/24/2018   Vitamin D deficiency 11/29/2017   Stage 3 chronic kidney disease ([place]) 10/28/2017   Primary osteoarthritis of right hip 09/11/2017   Essential hypertension 07/18/2017   Hyperlipidemia associated with type 2 diabetes mellitus ([place]) 07/18/2017   Type 2 diabetes mellitus with diabetic neuropathy, unspecified ([place])  07/18/2017   Heart murmur 07/18/2017   Hypertriglyceridemia 07/18/2017   History of stroke 07/18/2017   DDD (degenerative disc disease), lumbar 06/15/2017   Iron deficiency anemia 06/15/2017   Stroke ([place]) 06/15/2017   Encounter for hepatitis C screening test for low risk patient 06/30/2016   Anemia 06/19/2016   Microalbuminuria due to type 2 diabetes mellitus ([place]) 03/19/2016   Hypertrophy of inferior nasal turbinate 12/20/2015   Disease of nasal cavity and sinuses 12/10/2015   Degenerative tear of acetabular labrum of left hip  11/04/2015   History of tobacco abuse 05/15/2015   Morbid (severe) obesity due to excess calories ([place]) 04/11/2015   Disorder of both eustachian tubes 01/27/2015   Maxillary sinusitis 01/13/2015   Lipoprotein deficiency 01/31/2014   Gastro-esophageal reflux disease without esophagitis 11/02/2013   Rheumatic fever 11/02/2013   Erectile dysfunction associated with type 2 diabetes mellitus ([place]) 07/31/2013   Type 2 diabetes mellitus with hyperglycemia, without long-term current use of insulin ([place]) 11/16/2012     Medication Assistance:   Patient gets [place] and [employer] thru [employer] medication assistance program - application handled by his endocrinologist office - Dr Meredith Pel.    Assessment / Plan: DM, insulin dependent: Last A1c on file was at goal  Continue current therapy and follow up with Dr Meredith Pel.  Discussed Continuous Glucose Monitor which would be an option for patient. He will think about it but is not interested today. He will discuss with endocrinologist.  Continue current medicaitons and follow up with Dr Meredith Pel.  Requested notes form [facility] in [place]  Hypertension: Last blood pressure at rheumatologist office was elevated but patient's home blood pressure has been at goal of < 130/80 Continue lisinopril and amlodipine  Hyperlipidemia: With history of TIA and DM - goal LDL < 55; Tg < 150 - not at goal. Noted to be statin  intolerant.  Checked [employer] coverage of prescription Omega 3 agents but they are not covered. He might see less belching / reflux with prescription strength. Patient declines. Will continue omega 3 FA over-the-counter 1000mg  daily.  Continue ezetimibe and fenofibrate. If LDL remains above goal - consider PCSK9 therapy.  CKD - patient did not start [place] due to cost.  Will forward request for medication assistance program assessment to Rx Team for [place].   Medication management:  Reviewed and updated medication list Reviewed refill history and adherence   Follow Up:  Telephone follow up appointment with care management team member scheduled for:  2  months.   [name], PharmD Clinical Pharmacist [place] [phone]

## 2023-01-06 NOTE — Telephone Encounter (Signed)
[facility] called stating that in order to release the Diabetic Eye Exam info to [place], a signed ROI would need to be faxed over to them when possible.

## 2023-01-10 NOTE — Telephone Encounter (Signed)
Release E-Faxed to correct location.

## 2023-01-10 NOTE — Telephone Encounter (Signed)
Sabrina with [facility] in [place] called to let us know they received a records request but pt was not seen in their location. The fax number to the office he visited is [phone].

## 2023-01-11 ENCOUNTER — Encounter: Payer: Self-pay | Admitting: Family Medicine

## 2023-01-13 ENCOUNTER — Other Ambulatory Visit (HOSPITAL_COMMUNITY): Payer: Self-pay

## 2023-01-13 ENCOUNTER — Encounter: Payer: Self-pay | Admitting: Family

## 2023-01-13 ENCOUNTER — Telehealth: Payer: Self-pay

## 2023-01-13 NOTE — Telephone Encounter (Signed)
-----   Message from [name], [place] sent at 01/12/2023  1:02 PM EDT ----- Please contact patient regarding medication assistance program for [place].

## 2023-01-13 NOTE — Telephone Encounter (Signed)
ERROR

## 2023-01-13 NOTE — Telephone Encounter (Signed)
Application has been filled out and will be mailed on Monday 01/17/2023

## 2023-01-16 NOTE — Progress Notes (Signed)
Office Visit Note  Patient: Tommy PaceDouglas B Chakraborty             Date of Birth: 01/30/1946           MRN: [id number]             PCP: Sharlene DoryWendling, Nicholas Paul, DO Referring: Sharlene DoryWendling, Nicholas Paul* Visit Date: 01/27/2023 Occupation: @GUAROCC @  Subjective:  Patient would like to discuss nodules on fingers and elbows.    History of Present Illness: Tommy Green is a 77 y.o. male history of seropositive rheumatoid Arth at his rheumatoid nodulosis, osteoarthritis and degenerative disc disease.  He states he has not noticed any joint pain or joint swelling.  He had a flare of rheumatoid arthritis once he was off Enbrel and leflunomide after his back surgery.  He states he recently had some rheumatoid nodules resected.  He is concerned about the nodules he has on his elbows.  He ambulates with the help of a rollator walker.  He states he had few new lesions of the skin cancer which was removed from his right arm.  He has been using sunscreen.  He takes Enbrel subcutaneous 50 mg weekly and leflunomide 10 mg p.o. daily.  He has been taking Norco for pain management.  He continues to have some lower back pain.    Activities of Daily Living:  Patient reports morning stiffness for 10 minutes.   Patient Denies nocturnal pain.  Difficulty dressing/grooming: Reports Difficulty climbing stairs: Reports Difficulty getting out of chair: Reports Difficulty using hands for taps, buttons, cutlery, and/or writing: Reports  Review of Systems  Constitutional:  Positive for fatigue.  HENT:  Positive for mouth dryness. Negative for mouth sores.   Eyes:  Negative for dryness.  Respiratory:  Negative for shortness of breath.   Cardiovascular:  Negative for chest pain and palpitations.  Gastrointestinal:  Negative for blood in stool, constipation and diarrhea.  Endocrine: Negative for increased urination.  Genitourinary:  Negative for involuntary urination.  Musculoskeletal:  Positive for gait problem,  myalgias, morning stiffness, muscle tenderness and myalgias. Negative for joint pain, joint pain, joint swelling and muscle weakness.  Skin:  Negative for color change, rash, hair loss and sensitivity to sunlight.  Allergic/Immunologic: Negative for susceptible to infections.  Neurological:  Negative for dizziness and headaches.  Hematological:  Positive for swollen glands.  Psychiatric/Behavioral:  Negative for depressed mood and sleep disturbance. The patient is not nervous/anxious.     PMFS History:  Patient Active Problem List   Diagnosis Date Noted   IDA (iron deficiency anemia) 07/06/2022   Current mild episode of major depressive disorder without prior episode 05/17/2022   Other chronic pain 03/05/2022   Bilateral lower extremity edema 03/05/2022   Severe aortic stenosis 01/26/2022   S/P TAVR (transcatheter aortic valve replacement) 01/26/2022   Microcytic anemia 12/10/2021   Obesity (BMI 30-39.9) 12/10/2021   Near syncope 12/09/2021   Aortic stenosis 12/09/2021   Rheumatoid arthritis 12/09/2021   Foot drop, left foot 10/02/2021   History of colonic polyps 10/02/2021   History of rheumatic fever 10/02/2021   Stress fracture of left foot 09/03/2021   Gabapentin overdose, accidental or unintentional, initial encounter 03/08/2021   TIA (transient ischemic attack) 03/05/2021   Body mass index (BMI) 36.0-36.9, adult 10/03/2020   Subdural hemorrhage following injury without open intracranial wound and with prolonged loss of consciousness (more than 24 hours) without return to pre-existing conscious level 10/03/2020   AKI (acute kidney injury)    Acute  encephalopathy    SDH (subdural hematoma) 10/24/2019   Cyclic vomiting syndrome 08/06/2019   Neuropathy 12/25/2018   AK (actinic keratosis) 08/24/2018   Neoplasm of uncertain behavior 07/27/2018   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   Tendinopathy of right gluteus medius 07/13/2018   Tendinopathy of left gluteus medius 07/13/2018    Squamous cell carcinoma in situ (SCCIS) of skin 03/24/2018   Vitamin D deficiency 11/29/2017   Stage 3 chronic kidney disease 10/28/2017   Primary osteoarthritis of right hip 09/11/2017   Essential hypertension 07/18/2017   Hyperlipidemia associated with type 2 diabetes mellitus 07/18/2017   Type 2 diabetes mellitus with diabetic neuropathy, unspecified 07/18/2017   Heart murmur 07/18/2017   Hypertriglyceridemia 07/18/2017   History of stroke 07/18/2017   DDD (degenerative disc disease), lumbar 06/15/2017   Iron deficiency anemia 06/15/2017   Stroke 06/15/2017   Encounter for hepatitis C screening test for low risk patient 06/30/2016   Anemia 06/19/2016   Microalbuminuria due to type 2 diabetes mellitus 03/19/2016   Hypertrophy of inferior nasal turbinate 12/20/2015   Disease of nasal cavity and sinuses 12/10/2015   Degenerative tear of acetabular labrum of left hip 11/04/2015   History of tobacco abuse 05/15/2015   Morbid (severe) obesity due to excess calories 04/11/2015   Disorder of both eustachian tubes 01/27/2015   Maxillary sinusitis 01/13/2015   Lipoprotein deficiency 01/31/2014   Gastro-esophageal reflux disease without esophagitis 11/02/2013   Rheumatic fever 11/02/2013   Erectile dysfunction associated with type 2 diabetes mellitus 07/31/2013   Type 2 diabetes mellitus with hyperglycemia, without long-term current use of insulin 11/16/2012    Past Medical History:  Diagnosis Date   Anemia    Essential hypertension 07/18/2017   Foot drop, left foot    due to back surgery in 01/2021   GERD (gastroesophageal reflux disease)    History of colonic polyps    History of hiatal hernia    many years ago   History of rheumatic fever    History of stroke 07/18/2017   Hypertriglyceridemia 07/18/2017   S/P TAVR (transcatheter aortic valve replacement) 01/26/2022   [name] 84mm via TF approach with Dr. Lynnette Caffey and Dr. Laneta Simmers   Severe aortic stenosis    Type 2 diabetes  mellitus with diabetic neuropathy, unspecified 07/18/2017    Family History  Problem Relation Age of Onset   Hyperlipidemia Mother    Colon polyps Mother    Hyperlipidemia Father    Alcoholism Brother    Healthy Son    Healthy Son    Cancer Neg Hx    Colon cancer Neg Hx    Esophageal cancer Neg Hx    Rectal cancer Neg Hx    Stomach cancer Neg Hx    Past Surgical History:  Procedure Laterality Date   COLONOSCOPY     around 2018 [facility]   COLONOSCOPY  09/02/2020   CRANIOTOMY Left 10/25/2019   Procedure: CRANIOTOMY HEMATOMA EVACUATION SUBDURAL;  Surgeon: Bedelia Person, MD;  Location: [facility] OR;  Service: Neurosurgery;  Laterality: Left;   ESOPHAGOGASTRODUODENOSCOPY     around 2018 with [facility]   FOOT CAPSULOTOMY Left 07/25/2008   Mid Foot #2 MPJ   Hammertoe Repair Left 07/25/2008   #2 toe   HAND SURGERY     nodule removed from left thumb and right index finger, Dr. Judithann Sheen   INTRAOPERATIVE TRANSTHORACIC ECHOCARDIOGRAM N/A 01/26/2022   Procedure: INTRAOPERATIVE TRANSTHORACIC ECHOCARDIOGRAM;  Surgeon: Orbie Pyo, MD;  Location: [facility] OR;  Service:  Open Heart Surgery;  Laterality: N/A;   MOHS SURGERY  01/25/2023   right arm   RIGHT/LEFT HEART CATH AND CORONARY ANGIOGRAPHY N/A 12/11/2021   Procedure: RIGHT/LEFT HEART CATH AND CORONARY ANGIOGRAPHY;  Surgeon: Corky Crafts, MD;  Location: [facility] INVASIVE CV LAB;  Service: Cardiovascular;  Laterality: N/A;   SPINAL FUSION  01/2021   TARSAL TUNNEL RELEASE Left 07/25/2008   TONSILLECTOMY     removed as a child   TRANSCATHETER AORTIC VALVE REPLACEMENT, TRANSFEMORAL N/A 01/26/2022   Procedure: Transcatheter Aortic Valve Replacement , Transfemoral;  Surgeon: Orbie Pyo, MD;  Location: [facility] OR;  Service: Open Heart Surgery;  Laterality: N/A;  Percutaneous   UPPER GASTROINTESTINAL ENDOSCOPY  09/02/2020   Social History   Social History Narrative   Not on file   Immunization History  Administered Date(s)  Administered   Fluad Quad(high Dose 65+) 08/31/2021, 09/23/2022   Influenza, High Dose Seasonal PF 07/31/2013   Influenza-Unspecified 07/31/2013, 04/17/2014   Moderna Sars-Covid-2 Vaccination 12/11/2019, 01/10/2020, 08/08/2020   Pneumococcal Conjugate-13 08/07/2015   Pneumococcal Polysaccharide-23 04/17/2013, 07/18/2013   Td 12/25/2018   Zoster, Live 04/17/2013     Objective: Vital Signs: BP 127/67 (BP Location: Left Arm, Patient Position: Sitting, Cuff Size: Normal)   Pulse 71   Resp 18   Ht 6' (1.829 m)   Wt 226 lb 3.2 oz (102.6 kg)   BMI 30.68 kg/m    Physical Exam Vitals and nursing note reviewed.  Constitutional:      Appearance: He is well-developed.  HENT:     Head: Normocephalic and atraumatic.  Eyes:     Conjunctiva/sclera: Conjunctivae normal.     Pupils: Pupils are equal, round, and reactive to light.  Cardiovascular:     Rate and Rhythm: Normal rate and regular rhythm.     Heart sounds: Normal heart sounds.  Pulmonary:     Effort: Pulmonary effort is normal.     Breath sounds: Normal breath sounds.  Abdominal:     General: Bowel sounds are normal.     Palpations: Abdomen is soft.  Musculoskeletal:     Cervical back: Normal range of motion and neck supple.  Skin:    General: Skin is warm and dry.     Capillary Refill: Capillary refill takes less than 2 seconds.  Neurological:     Mental Status: He is alert and oriented to person, place, and time.  Psychiatric:        Behavior: Behavior normal.      Musculoskeletal Exam: He had limited range of motion of the cervical spine.  He had thoracic kyphosis.  Lumbar spine was difficult to assess in the sitting position.  He ambulated with the help of walker.  He states bent over.  Shoulder joints and elbow joints were in good range of motion.  Rheumatoid nodules were noted over bilateral elbows and his hands.  He had bilateral MCP thickening, PIP and DIP thickening with no synovitis.  Hip joints were difficult to  assess in the sitting position.  Knee joints were in good range of motion without any warmth swelling or effusion.  He had no tenderness over ankles or MTPs.  CDAI Exam: CDAI Score: -- Patient Global: 3 mm; Provider Global: 3 mm Swollen: --; Tender: -- Joint Exam 01/27/2023   No joint exam has been documented for this visit   There is currently no information documented on the homunculus. Go to the Rheumatology activity and complete the homunculus joint exam.  Investigation: No  additional findings.  Imaging: ECHOCARDIOGRAM COMPLETE  Result Date: 01/26/2023    ECHOCARDIOGRAM REPORT   Patient Name:   JAROLD MACOMBER Date of Exam: 01/26/2023 Medical Rec #:  [id number]         Height:       71.0 in Accession #:    [id number]        Weight:       224.0 lb Date of Birth:  1946-04-06          BSA:          2.213 m Patient Age:    77 years          BP:           171/79 mmHg Patient Gender: M                 HR:           78 bpm. Exam Location:  [address] Procedure: 2D Echo, Cardiac Doppler and Color Doppler Indications:    Z95.2 Post TAVR  History:        Patient has prior history of Echocardiogram examinations, most                 recent 03/03/2022. CKD and Stroke, Signs/Symptoms:Murmur; Risk                 Factors:Diabetes, Hypertension and HLD.  Sonographer:    Clearence Ped RCS Referring Phys: [id number] KATHRYN R THOMPSON IMPRESSIONS  1. Left ventricular ejection fraction, by estimation, is 55 to 60%. The left ventricle has normal function. The left ventricle has no regional wall motion abnormalities. There is mild concentric left ventricular hypertrophy.  2. Right ventricular systolic function is normal. The right ventricular size is normal. There is normal pulmonary artery systolic pressure.  3. The mitral valve is degenerative. No evidence of mitral valve regurgitation. No evidence of mitral stenosis. Severe mitral annular calcification.  4. The aortic valve has been replaced with a 29 mm Sapien  valve. Aortic valve regurgitation is not visualized (with no PVL). Procedure Date: 01/26/2022. Effective orifice area, by VTI measures 1.86 cm. Aortic valve mean gradient measures 12.0 mmHg.  5. Aortic dilatation noted. There is mild dilatation of the aortic root, measuring 40 mm.  6. The inferior vena cava is normal in size with greater than 50% respiratory variability, suggesting right atrial pressure of 3 mmHg. Comparison(s): Prior images reviewed side by side. Normal aortic valve prosthetic parameters. FINDINGS  Left Ventricle: Left ventricular ejection fraction, by estimation, is 55 to 60%. The left ventricle has normal function. The left ventricle has no regional wall motion abnormalities. The left ventricular internal cavity size was normal in size. There is  mild concentric left ventricular hypertrophy. Left ventricular diastolic parameters are consistent with Grade I diastolic dysfunction (impaired relaxation). Right Ventricle: The right ventricular size is normal. No increase in right ventricular wall thickness. Right ventricular systolic function is normal. There is normal pulmonary artery systolic pressure. The tricuspid regurgitant velocity is 1.41 m/s, and  with an assumed right atrial pressure of 3 mmHg, the estimated right ventricular systolic pressure is 11.0 mmHg. Left Atrium: Left atrial size was normal in size. Right Atrium: Right atrial size was normal in size. Prominent Eustachian valve. Pericardium: There is no evidence of pericardial effusion. Presence of epicardial fat layer. Mitral Valve: The mitral valve is degenerative in appearance. Severe mitral annular calcification. No evidence of mitral valve regurgitation. No evidence of mitral valve stenosis. Tricuspid Valve:  The tricuspid valve is normal in structure. Tricuspid valve regurgitation is not demonstrated. No evidence of tricuspid stenosis. Aortic Valve: The aortic valve has been repaired/replaced. Aortic valve regurgitation is not  visualized. Aortic valve mean gradient measures 12.0 mmHg. Aortic valve peak gradient measures 22.5 mmHg. Aortic valve area, by VTI measures 1.86 cm. There is a  29 mm Sapien prosthetic, stented (TAVR) valve present in the aortic position. Pulmonic Valve: The pulmonic valve was normal in structure. Pulmonic valve regurgitation is not visualized. No evidence of pulmonic stenosis. Aorta: Aortic dilatation noted. There is mild dilatation of the aortic root, measuring 40 mm. Venous: The inferior vena cava is normal in size with greater than 50% respiratory variability, suggesting right atrial pressure of 3 mmHg. IAS/Shunts: No atrial level shunt detected by color flow Doppler.  LEFT VENTRICLE PLAX 2D LVIDd:         3.90 cm   Diastology LVIDs:         2.90 cm   LV e' medial:    6.31 cm/s LV PW:         1.50 cm   LV E/e' medial:  14.5 LV IVS:        1.60 cm   LV e' lateral:   9.03 cm/s LVOT diam:     2.20 cm   LV E/e' lateral: 10.1 LV SV:         93 LV SV Index:   42 LVOT Area:     3.80 cm  RIGHT VENTRICLE RV Basal diam:  3.30 cm RV S prime:     19.50 cm/s TAPSE (M-mode): 2.7 cm RVSP:           11.0 mmHg LEFT ATRIUM             Index        RIGHT ATRIUM           Index LA diam:        4.50 cm 2.03 cm/m   RA Pressure: 3.00 mmHg LA Vol (A2C):   70.0 ml 31.64 ml/m  RA Area:     10.70 cm LA Vol (A4C):   59.6 ml 26.94 ml/m  RA Volume:   24.90 ml  11.25 ml/m LA Biplane Vol: 68.2 ml 30.82 ml/m  AORTIC VALVE AV Area (Vmax):    1.80 cm AV Area (Vmean):   1.89 cm AV Area (VTI):     1.86 cm AV Vmax:           237.00 cm/s AV Vmean:          164.000 cm/s AV VTI:            0.501 m AV Peak Grad:      22.5 mmHg AV Mean Grad:      12.0 mmHg LVOT Vmax:         112.00 cm/s LVOT Vmean:        81.600 cm/s LVOT VTI:          0.245 m LVOT/AV VTI ratio: 0.49  AORTA Ao Root diam: 4.00 cm Ao Asc diam:  3.70 cm MITRAL VALVE                TRICUSPID VALVE MV Area (PHT):              TR Peak grad:   8.0 mmHg MV Decel Time:              TR  Vmax:        141.00  cm/s MV E velocity: 91.20 cm/s   Estimated RAP:  3.00 mmHg MV A velocity: 146.00 cm/s  RVSP:           11.0 mmHg MV E/A ratio:  0.62                             SHUNTS                             Systemic VTI:  0.24 m                             Systemic Diam: 2.20 cm Riley Lam MD Electronically signed by Riley Lam MD Signature Date/Time: 01/26/2023/3:23:29 PM    Final     Recent Labs: Lab Results  Component Value Date   WBC 10.5 12/16/2022   HGB 9.8 (L) 12/16/2022   PLT 303 12/16/2022   NA 137 12/16/2022   K 4.8 12/16/2022   CL 106 12/16/2022   CO2 22 12/16/2022   GLUCOSE 172 (H) 12/16/2022   BUN 30 (H) 12/16/2022   CREATININE 1.70 (H) 12/16/2022   BILITOT 0.3 12/16/2022   ALKPHOS 49 08/17/2022   AST 13 12/16/2022   ALT 9 12/16/2022   PROT 6.4 12/16/2022   ALBUMIN 4.1 08/17/2022   CALCIUM 8.8 12/16/2022   GFRAA 48 (L) 07/15/2020   QFTBGOLDPLUS NEGATIVE 08/10/2022    Speciality Comments: Enbrel 05/2020  Procedures:  No procedures performed Allergies: Canagliflozin, Aspirin, Atorvastatin, and Statins   Assessment / Plan:     Visit Diagnoses: Rheumatoid arthritis involving multiple sites with positive rheumatoid factor - Positive RF, positive anti-CCP, elevated sedimentation rate, history of inflammatory arthritis: He denies any increased joint pain or joint swelling.  He had no synovitis on my examination.  He has been taking Enbrel 50 mg subcu weekly and Arava 10 mg p.o. daily.  He denies any side effects from the medications.  He recalls having a flare of arthritis once he was off the medications for lumbar spine surgery.  He is concerned about rheumatoid nodules.  He has been having recurrent skin cancer.  I discussed the option of switching him from Enbrel to either Orencia or Actemra.  Patient does not want to change medication as he has done well on the medication.  He states he was having a skin cancer even prior to starting Enbrel.  I  also advised to have repeat x-rays to monitor disease process.  He would like to wait until the next visit.  Rheumatoid nodulosis-he has multiple nodules over his elbows and his hands.  He had few nodules resected.  Association of rheumatoid nodulosis with rheumatoid arthritis was discussed.  High risk medication use - Arava 10 mg 1 tablet by mouth daily and Enbrel mini 50 mg sq injections once weekly.TB Gold negative on 08/10/2022.  Labs obtained on December 16, 2022 CBC showed hemoglobin of 9.8, CMP showed creatinine of 1.70.  Information regarding immunization was placed in the AVS.  He was advised to hold Enbrel and leflunomide if he develops an infection and resume after the infection resolves.  He was also advised to hold both medications if he has surgery and resume 2 weeks after the surgery.  Increased risk of for skin cancer with Enbrel was discussed.  He had recent resection of a skin cancer from his right arm.  Use of sunscreen was discussed.  Contracture of right elbow-unchanged.  Primary osteoarthritis of both hips - Moderate to severe osteoarthritis of both hips.  Hip joints were difficult to assess in the sitting position.  Primary osteoarthritis of left knee-he had good range of motion without discomfort.  Chronic SI joint pain-he continues to have some lower back pain.  Pain in left ankle and joints of left foot-is currently not having any discomfort.  DDD (degenerative disc disease), lumbar -he had L4-L5 fusion by Dr. Noel Gerold.  He ambulates with the help of a rollator walker.  He continues to have some lower back discomfort.  Other medical problems are listed as follows:  SDH (subdural hematoma)  S/P TAVR (transcatheter aortic valve replacement) - April 2023.  Essential hypertension  Hyperlipidemia associated with type 2 diabetes mellitus  Type 2 diabetes mellitus with diabetic neuropathy, with long-term current use of insulin  Granuloma annulare  Squamous cell  carcinoma in situ (SCCIS) of skin-he is followed by dermatologist.  Use of sunscreen was advised.  Orders: No orders of the defined types were placed in this encounter.  No orders of the defined types were placed in this encounter.   Follow-Up Instructions: Return in about 5 months (around 06/29/2023) for Rheumatoid arthritis, Osteoarthritis.   Pollyann Savoy, MD  Note - This record has been created using Animal nutritionist.  Chart creation errors have been sought, but may not always  have been located. Such creation errors do not reflect on  the standard of medical care.

## 2023-01-20 DIAGNOSIS — E1165 Type 2 diabetes mellitus with hyperglycemia: Secondary | ICD-10-CM | POA: Diagnosis not present

## 2023-01-20 DIAGNOSIS — N183 Chronic kidney disease, stage 3 unspecified: Secondary | ICD-10-CM | POA: Diagnosis not present

## 2023-01-20 DIAGNOSIS — E1140 Type 2 diabetes mellitus with diabetic neuropathy, unspecified: Secondary | ICD-10-CM | POA: Diagnosis not present

## 2023-01-20 DIAGNOSIS — M069 Rheumatoid arthritis, unspecified: Secondary | ICD-10-CM | POA: Diagnosis not present

## 2023-01-20 DIAGNOSIS — E559 Vitamin D deficiency, unspecified: Secondary | ICD-10-CM | POA: Diagnosis not present

## 2023-01-20 DIAGNOSIS — E669 Obesity, unspecified: Secondary | ICD-10-CM | POA: Diagnosis not present

## 2023-01-20 DIAGNOSIS — E782 Mixed hyperlipidemia: Secondary | ICD-10-CM | POA: Diagnosis not present

## 2023-01-20 DIAGNOSIS — I6930 Unspecified sequelae of cerebral infarction: Secondary | ICD-10-CM | POA: Diagnosis not present

## 2023-01-20 DIAGNOSIS — D509 Iron deficiency anemia, unspecified: Secondary | ICD-10-CM | POA: Diagnosis not present

## 2023-01-20 DIAGNOSIS — I10 Essential (primary) hypertension: Secondary | ICD-10-CM | POA: Diagnosis not present

## 2023-01-24 NOTE — Telephone Encounter (Signed)
Received call from pt's wife, stating husband had never been prescribed medication, I told her I would look into it and give her a call right back. I called back and spoke with Tommy Green, I reminded him that it was prescribed to him in March but that he indicated that he hadn't started taking [place] due to costs. He stated "That is exactly right." And that he would do his best to fill out the forms for pt assistance.

## 2023-01-25 DIAGNOSIS — C4361 Malignant melanoma of right upper limb, including shoulder: Secondary | ICD-10-CM | POA: Diagnosis not present

## 2023-01-25 HISTORY — PX: MOHS SURGERY: SUR867

## 2023-01-26 ENCOUNTER — Ambulatory Visit (HOSPITAL_BASED_OUTPATIENT_CLINIC_OR_DEPARTMENT_OTHER): Payer: Medicare HMO

## 2023-01-26 ENCOUNTER — Ambulatory Visit: Payer: Medicare HMO | Attending: Cardiology | Admitting: Cardiology

## 2023-01-26 VITALS — BP 134/70 | HR 81 | Ht 71.0 in | Wt 226.8 lb

## 2023-01-26 DIAGNOSIS — R9389 Abnormal findings on diagnostic imaging of other specified body structures: Secondary | ICD-10-CM | POA: Diagnosis not present

## 2023-01-26 DIAGNOSIS — I639 Cerebral infarction, unspecified: Secondary | ICD-10-CM | POA: Diagnosis not present

## 2023-01-26 DIAGNOSIS — Z952 Presence of prosthetic heart valve: Secondary | ICD-10-CM

## 2023-01-26 DIAGNOSIS — I10 Essential (primary) hypertension: Secondary | ICD-10-CM

## 2023-01-26 DIAGNOSIS — N189 Chronic kidney disease, unspecified: Secondary | ICD-10-CM

## 2023-01-26 DIAGNOSIS — I350 Nonrheumatic aortic (valve) stenosis: Secondary | ICD-10-CM | POA: Insufficient documentation

## 2023-01-26 DIAGNOSIS — I2510 Atherosclerotic heart disease of native coronary artery without angina pectoris: Secondary | ICD-10-CM

## 2023-01-26 LAB — ECHOCARDIOGRAM COMPLETE
AR max vel: 1.8 cm2
AV Area VTI: 1.86 cm2
AV Area mean vel: 1.89 cm2
AV Mean grad: 12 mmHg
AV Peak grad: 22.5 mmHg
Ao pk vel: 2.37 m/s
Area-P 1/2: 2.72 cm2
S' Lateral: 2.9 cm

## 2023-01-26 MED ORDER — ASPIRIN 81 MG PO TBEC: 81.0000 mg | DELAYED_RELEASE_TABLET | Freq: Every day | ORAL | 3 refills | Status: DC

## 2023-01-26 NOTE — Patient Instructions (Signed)
Medication Instructions:  Your physician has recommended you make the following change in your medication:  START ASPIRIN 81 MG DAILY OVER-THE-COUNTER   *If you need a refill on your cardiac medications before your next appointment, please call your pharmacy*   Lab Work: NONE If you have labs (blood work) drawn today and your tests are completely normal, you will receive your results only by: MyChart Message (if you have MyChart) OR A paper copy in the mail If you have any lab test that is abnormal or we need to change your treatment, we will call you to review the results.   Testing/Procedures: NONE   Follow-Up: At [facility], you and your health needs are our priority.  As part of our continuing mission to provide you with exceptional heart care, we have created designated Provider Care Teams.  These Care Teams include your primary Cardiologist (physician) and Advanced Practice Providers (APPs -  Physician Assistants and Nurse Practitioners) who all work together to provide you with the care you need, when you need it.  We recommend signing up for the patient portal called "MyChart".  Sign up information is provided on this After Visit Summary.  MyChart is used to connect with patients for Virtual Visits (Telemedicine).  Patients are able to view lab/test results, encounter notes, upcoming appointments, etc.  Non-urgent messages can be sent to your provider as well.   To learn more about what you can do with MyChart, go to [email or web address].    Your next appointment:   KEEP SCHEDULED FOLLOW-UP

## 2023-01-26 NOTE — Progress Notes (Unsigned)
[facility]   MULTIDISCIPLINARY HEART VALVE CLINIC                                     Cardiology Office Note:    Date:  01/27/2023   ID:  Tommy Green, DOB 10/22/1945, MRN [id number]  PCP:  Sharlene DoryWendling, Nicholas Paul, DO  [facility] Cardiologist:  Norman HerrlichBrian Munley, MD  / Dr. Lynnette Caffeyhukkani & Dr. Laneta SimmersBartle (TAVR)  [facility] Electrophysiologist:  None   Referring MD: Sharlene DoryWendling, Nicholas Paul*   Chief Complaint  Patient presents with   Follow-up    1 year s/p TAVR   History of Present Illness:    Tommy Green is a 77 y.o. male with a hx of childhood rheumatic fever, DMT2, CKD stage III, HTN, HLD, prior stroke, skin cancer, iron deficiency anemia, obesity and severe aortic stenosis who presents today for 1 week follow up s/p TAVR (01/26/22) who presents to clinic for follow up.    Per chart review, he underwent spinal fusion surgery in 01/2021. After the surgery, he became hypotensive requiring ICU level care, Levophed, blood transfusions, IV fluids. Echocardiogram during that admission showed a calcified aortic valve with moderate to severe aortic stenosis, mild LVH, LVEF 55 to 60%. He was then referred to Dr. Dulce SellarMunley for evaluation of aortic stenosis. On discussion, the patient and wife were both very hesitant to consider cardiac interventions. Repeat echocardiogram on 10/21/2021 showed LVEF 50 to 55%, grade 1 diastolic dysfunction, severe aortic valve stenosis, severe calcification of the aortic valve.   He then presented to the ED on 2/22 complaining of dizziness. Reported that he felt different than his previous history of vertigo. At that time, he was evaluated by the structural heart team with plan to pursue TAVR. He underwent R/LHC and CT imaging which showed moderate, nonobstructive coronary artery disease and anatomy suitable for transcatheter aortic valve replacement and underwent TAVR with 29 mm Edwards Sapien 3UR THV via the TF approach on 01/26/22. Post operative echo  with normal LVEF and AVA by VTI measures 2.25 cm, mean gradient measures 11.8 mmHg and no PVL. He was previously taking ASA on a PRN basis and ASA was listed as an allergy. We confirmed with him that he is not allergic to ASA therefore he was started on ASA 81mg  QD.    If follow up, he has done well with no recent hospitalizations. He is here today with his wife. He continues to do well. He no long drives and uses a wheelchair for longer distances secondary to LE weakness and memory issues from his stoke. He denies SOB, LE edema, chest pain, palpitations, orthopnea, bleeding, dizziness, or syncope.     Past Medical History:  Diagnosis Date   Anemia    Essential hypertension 07/18/2017   Foot drop, left foot    due to back surgery in 01/2021   GERD (gastroesophageal reflux disease)    History of colonic polyps    History of hiatal hernia    many years ago   History of rheumatic fever    History of stroke 07/18/2017   Hypertriglyceridemia 07/18/2017   S/P TAVR (transcatheter aortic valve replacement) 01/26/2022   Edwards S3UR 29mm via TF approach with Dr. Lynnette Caffeyhukkani and Dr. Laneta SimmersBartle   Severe aortic stenosis    Type 2 diabetes mellitus with diabetic neuropathy, unspecified 07/18/2017    Past Surgical History:  Procedure Laterality Date  COLONOSCOPY     around 2018 [facility]   COLONOSCOPY  09/02/2020   CRANIOTOMY Left 10/25/2019   Procedure: CRANIOTOMY HEMATOMA EVACUATION SUBDURAL;  Surgeon: Bedelia Person, MD;  Location: [facility] OR;  Service: Neurosurgery;  Laterality: Left;   ESOPHAGOGASTRODUODENOSCOPY     around 2018 with [facility]   FOOT CAPSULOTOMY Left 07/25/2008   Mid Foot #2 MPJ   Hammertoe Repair Left 07/25/2008   #2 toe   INTRAOPERATIVE TRANSTHORACIC ECHOCARDIOGRAM N/A 01/26/2022   Procedure: INTRAOPERATIVE TRANSTHORACIC ECHOCARDIOGRAM;  Surgeon: Orbie Pyo, MD;  Location[facility];  Service: Open Heart Surgery;  Laterality: N/A;   RIGHT/LEFT HEART CATH AND CORONARY  ANGIOGRAPHY N/A 12/11/2021   Procedure: RIGHT/LEFT HEART CATH AND CORONARY ANGIOGRAPHY;  Surgeon: Corky Crafts, MD;  Location: [facility] INVASIVE CV LAB;  Service: Cardiovascular;  Laterality: N/A;   SPINAL FUSION  01/2021   TARSAL TUNNEL RELEASE Left 07/25/2008   TONSILLECTOMY     removed as a child   TRANSCATHETER AORTIC VALVE REPLACEMENT, TRANSFEMORAL N/A 01/26/2022   Procedure: Transcatheter Aortic Valve Replacement , Transfemoral;  Surgeon: Orbie Pyo, MD;  Location: [facility] OR;  Service: Open Heart Surgery;  Laterality: N/A;  Percutaneous   UPPER GASTROINTESTINAL ENDOSCOPY  09/02/2020    Current Medications: Current Meds  Medication Sig   acetaminophen (TYLENOL) 325 MG tablet Take 650 mg by mouth every 6 (six) hours as needed for mild pain or headache.   amLODipine (NORVASC) 5 MG tablet TAKE 1 TABLET DAILY   amoxicillin (AMOXIL) 500 MG tablet Take 4 tablets (2,000 mg total) by mouth as directed. 1 HOUR PRIOR TO DENTAL APPOINTMENTS   aspirin EC 81 MG tablet Take 1 tablet (81 mg total) by mouth daily. Swallow whole.   B Complex Vitamins (B COMPLEX PO) Take 1 tablet by mouth daily.   Cholecalciferol (VITAMIN D3 SUPER STRENGTH) 50 MCG (2000 UT) CAPS Take 2,000 Units by mouth daily.   escitalopram (LEXAPRO) 10 MG tablet Take 1 tablet (10 mg total) by mouth daily.   Etanercept (ENBREL MINI) 50 MG/ML SOCT Inject 50 mg into the skin once a week.   ezetimibe (ZETIA) 10 MG tablet TAKE 1 TABLET DAILY FOR    ELEVATED CHOLESTEROL AND   TRIGLYCERIDES   fenofibrate micronized (LOFIBRA) 200 MG capsule TAKE 1 CAPSULE DAILY BEFOREBREAKFAST   fluticasone (FLONASE) 50 MCG/ACT nasal spray Place 2 sprays into both nostrils daily.   gabapentin (NEURONTIN) 100 MG capsule TAKE 2 CAPSULES AT BEDTIME   HYDROcodone-acetaminophen (NORCO) 7.5-325 MG tablet Take 1 tablet by mouth every 6 (six) hours as needed for moderate pain.   HYDROcodone-acetaminophen (NORCO) 7.5-325 MG tablet Take 1 tablet by mouth every  6 (six) hours as needed for moderate pain.   [START ON 02/06/2023] HYDROcodone-acetaminophen (NORCO) 7.5-325 MG tablet Take 1 tablet by mouth every 6 (six) hours as needed for moderate pain.   insulin aspart (NOVOLOG FLEXPEN) 100 UNIT/ML FlexPen Inject 0-10 Units into the skin See admin instructions. Inject 0-10 units into the skin before meals, PER SLIDING SCALE: BGL 0-200 = give nothing, 201-250 = 2 units, 251-300 = 4 units, 301-350 = 6 units, 351-400 = 8 units, >400 = 10 units, push fluids, and repeat BGL check in 2 hours. If BGL remains >400 after 2 hours, CALL MD.   leflunomide (ARAVA) 10 MG tablet Take 1 tablet (10 mg total) by mouth daily.   lisinopril (ZESTRIL) 40 MG tablet TAKE 1 TABLET IN THE  MORNING   magnesium oxide (MAG-OX) 400 MG tablet Take 400 mg by mouth daily with lunch.   melatonin 5 MG TABS Take 5 mg by mouth at bedtime.   Multiple Vitamins-Minerals (MULTIVITAMIN WITH MINERALS) tablet Take 1 tablet by mouth daily with breakfast.   Omega 3 1000 MG CAPS Take 1,000 mg by mouth daily with lunch.   omeprazole (PRILOSEC) 20 MG capsule TAKE 1 CAPSULE DAILY BEFOREBREAKFAST   ONETOUCH VERIO test strip 4 (four) times daily.   Potassium 99 MG TABS Take 99 mg by mouth every evening.   vitamin B-12 (CYANOCOBALAMIN) 1000 MCG tablet Take 1,000 mcg by mouth daily.   vitamin E 180 MG (400 UNITS) capsule Take 400 Units by mouth daily.     Allergies:   Canagliflozin, Aspirin, Atorvastatin, and Statins   Social History   Socioeconomic History   Marital status: Married    Spouse name: Not on file   Number of children: 2   Years of education: Not on file   Highest education level: Not on file  Occupational History   Not on file  Tobacco Use   Smoking status: Former    Packs/day: 1.00    Years: 10.00    Additional pack years: 0.00    Total pack years: 10.00    Types: Cigarettes    Quit date: 77    Years since quitting: 51.3    Passive exposure: Past   Smokeless tobacco:  Never  Vaping Use   Vaping Use: Never used  Substance and Sexual Activity   Alcohol use: Not Currently   Drug use: No   Sexual activity: Yes  Other Topics Concern   Not on file  Social History Narrative   Not on file   Social Determinants of Health   Financial Resource Strain: Low Risk  (08/18/2022)   Overall Financial Resource Strain (CARDIA)    Difficulty of Paying Living Expenses: Not hard at all  Food Insecurity: No Food Insecurity (08/18/2022)   Hunger Vital Sign    Worried About Running Out of Food in the Last Year: Never true    Ran Out of Food in the Last Year: Never true  Transportation Needs: No Transportation Needs (08/18/2022)   PRAPARE - Administrator, Civil Service (Medical): No    Lack of Transportation (Non-Medical): No  Physical Activity: Insufficiently Active (08/18/2022)   Exercise Vital Sign    Days of Exercise per Week: 2 days    Minutes of Exercise per Session: 20 min  Stress: No Stress Concern Present (08/18/2022)   [employer] of Occupational Health - Occupational Stress Questionnaire    Feeling of Stress : Not at all  Social Connections: Moderately Integrated (08/18/2022)   Social Connection and Isolation Panel [NHANES]    Frequency of Communication with Friends and Family: More than three times a week    Frequency of Social Gatherings with Friends and Family: More than three times a week    Attends Religious Services: More than 4 times per year    Active Member of [employer] or Organizations: No    Attends Banker Meetings: Never    Marital Status: Married     Family History: The patient's family history includes Alcoholism in his brother; Colon polyps in his mother; Healthy in his son and son; Hyperlipidemia in his father and mother. There is no history of Cancer, Colon cancer, Esophageal cancer, Rectal cancer, or Stomach cancer.  ROS:   Please see the history of present  illness.    All other systems reviewed and are  negative.  EKGs/Labs/Other Studies Reviewed:    The following studies were reviewed today:  Echocardiogram 01/26/23:   1. Left ventricular ejection fraction, by estimation, is 55 to 60%. The  left ventricle has normal function. The left ventricle has no regional  wall motion abnormalities. There is mild concentric left ventricular  hypertrophy.   2. Right ventricular systolic function is normal. The right ventricular  size is normal. There is normal pulmonary artery systolic pressure.   3. The mitral valve is degenerative. No evidence of mitral valve  regurgitation. No evidence of mitral stenosis. Severe mitral annular  calcification.   4. The aortic valve has been replaced with a 29 mm Sapien valve. Aortic  valve regurgitation is not visualized (with no PVL). Procedure Date:  01/26/2022. Effective orifice area, by VTI measures 1.86 cm. Aortic valve  mean gradient measures 12.0 mmHg.   5. Aortic dilatation noted. There is mild dilatation of the aortic root,  measuring 40 mm.   6. The inferior vena cava is normal in size with greater than 50%  respiratory variability, suggesting right atrial pressure of 3 mmHg.   Comparison(s): Prior images reviewed side by side. Normal aortic valve  prosthetic parameters.    TAVR OPERATIVE NOTE     Date of Procedure:                01/26/2022   Preoperative Diagnosis:      Severe Aortic Stenosis    Postoperative Diagnosis:    Same    Procedure:        Transcatheter Aortic Valve Replacement - Percutaneous Right Transfemoral Approach             Edwards Sapien 3 Ultra THV (size 29 mm, model # 9600TFX, serial # [id number])              Co-Surgeons:                        Alleen Borne, MD and Alverda Skeans, MD     Anesthesiologist:                  Jairo Ben, MD   Echocardiographer:              Charlton Haws, MD   Pre-operative Echo Findings: Severe aortic stenosis Normal left ventricular systolic function   Post-operative  Echo Findings: No paravalvular leak Normal left ventricular systolic function     Left Heart Catheterization Findings: Left ventricular end-diastolic pressure of 18 mmHg   _____________   Echo 01/27/22:   1. Left ventricular ejection fraction, by estimation, is 60 to 65%. The  left ventricle has normal function. The left ventricle has no regional  wall motion abnormalities. There is mild concentric left ventricular  hypertrophy and severe basal septal  hypertrophy.   2. Right ventricular systolic function is normal. The right ventricular  size is normal. Tricuspid regurgitation signal is inadequate for assessing  PA pressure.   3. The mitral valve is normal in structure. Trivial mitral valve  regurgitation. No evidence of mitral stenosis. Moderate mitral annular  calcification.   4. The aortic valve has been repaired/replaced. Aortic valve  regurgitation is trivial. Trivial perivalvular AI at 12:00 on parasternal  short axis view.      No aortic stenosis is present. There is a 29 mm Sapien prosthetic  (TAVR) valve present in the aortic position. Procedure Date: 01/27/2022.  Aortic valve area, by VTI measures 2.25 cm. Aortic valve mean gradient  measures 11.8 mmHg. Aortic valve Vmax  measures 2.25 m/s.   5. The inferior vena cava is normal in size with greater than 50%  respiratory variability, suggesting right atrial pressure of 3 mmHg.   6. Aortic dilatation noted. There is borderline dilatation of the  ascending aorta, measuring 37 mm.   7. Compared to study dated 01/26/2022, there is trivial perivalvular AI.  The mean AVG has increased from to 11.78mmHg.    ______________________   Echo 03/03/22 IMPRESSIONS  1. Left ventricular ejection fraction, by estimation, is 55 to 60%. Left  ventricular ejection fraction by 3D volume is 59 %. The left ventricle has  normal function. The left ventricle has no regional wall motion  abnormalities. There is severe hypertrophy of  the basal septal segment. The rest of the LV segments  demonstrate mild left ventricular hypertrophy. Left ventricular diastolic  parameters are consistent with Grade I diastolic dysfunction (impaired  relaxation).   2. Right ventricular systolic function is normal. The right ventricular  size is normal. Tricuspid regurgitation signal is inadequate for assessing  PA pressure.   3. The mitral valve is abnormal. Trivial mitral valve regurgitation.  Moderate mitral annular calcification.   4. The aortic valve has been repaired/replaced. There is a 29 mm Sapien  prosthetic (TAVR) valve present in the aortic position. Procedure Date:  01/26/22. Echo findings are consistent with normal structure and function  of the aortic valve prosthesis.  Aortic valve mean gradient measures 9.5 mmHg. Aortic valve Vmax measures  2.09 m/s. DI 0.49. No paravalvular leak visualized.   5. Aortic dilatation noted. There is borderline dilatation of the aortic  root, measuring 37 mm. There is borderline dilatation of the ascending  aorta, measuring 38 mm.   6. The inferior vena cava is normal in size with greater than 50%  respiratory variability, suggesting right atrial pressure of 3 mmHg.   Comparison(s): 01/27/22 EF 60-65%. AV mean PG, peak PG.    EKG:  EKG is not ordered today.    Recent Labs: 12/16/2022: ALT 9; BUN 30; Creat 1.70; Hemoglobin 9.8; Platelets 303; Potassium 4.8; Sodium 137  Recent Lipid Panel    Component Value Date/Time   CHOL 173 06/29/2022 1456   TRIG 446 (H) 06/29/2022 1456   HDL 24 (L) 06/29/2022 1456   CHOLHDL 7.2 (H) 06/29/2022 1456   CHOLHDL 7 05/04/2021 0852   VLDL 63.0 (H) 05/04/2021 0852   LDLCALC 78 06/29/2022 1456   LDLDIRECT 107.0 05/04/2021 0852   Physical Exam:    VS:  BP 134/70   Pulse 81   Ht 5\' 11"  (1.803 m)   Wt 226 lb 12.8 oz (102.9 kg)   SpO2 96%   BMI 31.63 kg/m     Wt Readings from Last 3 Encounters:  01/26/23 226 lb 12.8 oz (102.9 kg)   12/08/22 224 lb (101.6 kg)  10/07/22 235 lb (106.6 kg)    General: Well developed, well nourished, NAD Lungs:Clear to ausculation bilaterally. No wheezes, rales, or rhonchi. Breathing is unlabored. Cardiovascular: RRR with S1 S2. No murmur. Extremities: No edema.  Neuro: Alert and oriented. No focal deficits. No facial asymmetry. MAE spontaneously. Psych: Responds to questions appropriately with normal affect.    ASSESSMENT/PLAN:    Severe AS s/p TAVR: Patient doing well today with NYHA class I symptoms s/p TAVR. Echo normal LVEF, mean gradient , AVA by VTI ay 1.86cm2 with no  PVL. Patient noted not to be taking ASA. He will need to continue this due to AV replacement. Discussed. Continue ASA 81mg  monotherapy. Will need lifelong dental SBE. He has Amoxicillin 2g for dental cleanings and procedures. He has an appointment next week with Dr. Bing Matter.    CKD stage IIIa: Creatinine baseline appears to be in the 1.7-2.0 range. Follows closely with PCP.    Hypertension: No changes made.    Prior CVA: No new neuro changes. Continue ASA, continue Zetia   Abnormal CT of liver: Performed 03/18/23 with benign findings and no further workup recommended. This was communicated to the patient.    Medication Adjustments/Labs and Tests Ordered: Current medicines are reviewed at length with the patient today.  Concerns regarding medicines are outlined above.  No orders of the defined types were placed in this encounter.  Meds ordered this encounter  Medications   aspirin EC 81 MG tablet    Sig: Take 1 tablet (81 mg total) by mouth daily. Swallow whole.    Dispense:  90 tablet    Refill:  3    Patient Instructions  Medication Instructions:  Your physician has recommended you make the following change in your medication:  START ASPIRIN 81 MG DAILY OVER-THE-COUNTER   *If you need a refill on your cardiac medications before your next appointment, please call your pharmacy*   Lab  Work: NONE If you have labs (blood work) drawn today and your tests are completely normal, you will receive your results only by: MyChart Message (if you have MyChart) OR A paper copy in the mail If you have any lab test that is abnormal or we need to change your treatment, we will call you to review the results.   Testing/Procedures: NONE   Follow-Up: At [facility], you and your health needs are our priority.  As part of our continuing mission to provide you with exceptional heart care, we have created designated Provider Care Teams.  These Care Teams include your primary Cardiologist (physician) and Advanced Practice Providers (APPs -  Physician Assistants and Nurse Practitioners) who all work together to provide you with the care you need, when you need it.  We recommend signing up for the patient portal called "MyChart".  Sign up information is provided on this After Visit Summary.  MyChart is used to connect with patients for Virtual Visits (Telemedicine).  Patients are able to view lab/test results, encounter notes, upcoming appointments, etc.  Non-urgent messages can be sent to your provider as well.   To learn more about what you can do with MyChart, go to [email or web address].    Your next appointment:   KEEP SCHEDULED FOLLOW-UP   Signed, Georgie Chard, NP  01/27/2023 10:36 AM    [facility]

## 2023-01-27 ENCOUNTER — Encounter: Payer: Self-pay | Admitting: Rheumatology

## 2023-01-27 ENCOUNTER — Ambulatory Visit: Payer: Medicare HMO | Attending: Rheumatology | Admitting: Rheumatology

## 2023-01-27 VITALS — BP 127/67 | HR 71 | Resp 18 | Ht 72.0 in | Wt 226.2 lb

## 2023-01-27 DIAGNOSIS — L920 Granuloma annulare: Secondary | ICD-10-CM

## 2023-01-27 DIAGNOSIS — I10 Essential (primary) hypertension: Secondary | ICD-10-CM

## 2023-01-27 DIAGNOSIS — Z79899 Other long term (current) drug therapy: Secondary | ICD-10-CM | POA: Diagnosis not present

## 2023-01-27 DIAGNOSIS — M0579 Rheumatoid arthritis with rheumatoid factor of multiple sites without organ or systems involvement: Secondary | ICD-10-CM

## 2023-01-27 DIAGNOSIS — M5136 Other intervertebral disc degeneration, lumbar region: Secondary | ICD-10-CM | POA: Diagnosis not present

## 2023-01-27 DIAGNOSIS — M160 Bilateral primary osteoarthritis of hip: Secondary | ICD-10-CM | POA: Diagnosis not present

## 2023-01-27 DIAGNOSIS — E1140 Type 2 diabetes mellitus with diabetic neuropathy, unspecified: Secondary | ICD-10-CM

## 2023-01-27 DIAGNOSIS — D049 Carcinoma in situ of skin, unspecified: Secondary | ICD-10-CM

## 2023-01-27 DIAGNOSIS — M25572 Pain in left ankle and joints of left foot: Secondary | ICD-10-CM | POA: Diagnosis not present

## 2023-01-27 DIAGNOSIS — M24521 Contracture, right elbow: Secondary | ICD-10-CM

## 2023-01-27 DIAGNOSIS — G8929 Other chronic pain: Secondary | ICD-10-CM

## 2023-01-27 DIAGNOSIS — Z794 Long term (current) use of insulin: Secondary | ICD-10-CM

## 2023-01-27 DIAGNOSIS — E785 Hyperlipidemia, unspecified: Secondary | ICD-10-CM

## 2023-01-27 DIAGNOSIS — E1169 Type 2 diabetes mellitus with other specified complication: Secondary | ICD-10-CM

## 2023-01-27 DIAGNOSIS — M0630 Rheumatoid nodule, unspecified site: Secondary | ICD-10-CM

## 2023-01-27 DIAGNOSIS — M533 Sacrococcygeal disorders, not elsewhere classified: Secondary | ICD-10-CM

## 2023-01-27 DIAGNOSIS — M1712 Unilateral primary osteoarthritis, left knee: Secondary | ICD-10-CM | POA: Diagnosis not present

## 2023-01-27 DIAGNOSIS — Z952 Presence of prosthetic heart valve: Secondary | ICD-10-CM | POA: Diagnosis not present

## 2023-01-27 DIAGNOSIS — S065XAA Traumatic subdural hemorrhage with loss of consciousness status unknown, initial encounter: Secondary | ICD-10-CM

## 2023-01-27 NOTE — Patient Instructions (Addendum)
Standing Labs We placed an order today for your standing lab work.   Please have your standing labs drawn in May and every 3 months  Please have your labs drawn 2 weeks prior to your appointment so that the provider can discuss your lab results at your appointment, if possible.  Please note that you may see your imaging and lab results in MyChart before we have reviewed them. We will contact you once all results are reviewed. Please allow our office up to 72 hours to thoroughly review all of the results before contacting the office for clarification of your results.  WALK-IN LAB HOURS  Monday through Thursday from 8:00 am -12:30 pm and 1:00 pm-5:00 pm and Friday from 8:00 am-12:00 pm.  Patients with office visits requiring labs will be seen before walk-in labs.  You may encounter longer than normal wait times. Please allow additional time. Wait times may be shorter on  Monday and Thursday afternoons.  We do not book appointments for walk-in labs. We appreciate your patience and understanding with our staff.   Labs are drawn by [facility]. Please bring your co-pay at the time of your lab draw.  You may receive a bill from [facility] for your lab work.  Please note if you are on Hydroxychloroquine and and an order has been placed for a Hydroxychloroquine level,  you will need to have it drawn 4 hours or more after your last dose.  If you wish to have your labs drawn at another location, please call the office 24 hours in advance so we can fax the orders.  The office is located at [address], [place], [place] [address]   If you have any questions regarding directions or hours of operation,  please call [phone].   As a reminder, please drink plenty of water prior to coming for your lab work. Thanks!   Vaccines You are taking a medication(s) that can suppress your immune system.  The following immunizations are recommended: Flu annually Covid-19  Td/Tdap (tetanus, diphtheria,  pertussis) every 10 years Pneumonia (Prevnar 15 then Pneumovax 23 at least 1 year apart.  Alternatively, can take Prevnar 20 without needing additional dose) Shingrix: 2 doses from 4 weeks to 6 months apart  Please check with your PCP to make sure you are up to date.   If you have signs or symptoms of an infection or start antibiotics: First, call your PCP for workup of your infection. Hold your medication through the infection, until you complete your antibiotics, and until symptoms resolve if you take the following: Injectable medication (Actemra, Benlysta, Cimzia, Cosentyx, Enbrel, Humira, Kevzara, Orencia, Remicade, Simponi, Stelara, Taltz, Tremfya) Methotrexate Leflunomide (Arava) Mycophenolate (Cellcept) Harriette Ohara, Olumiant, or Rinvoq  Please get an annual skin examination to screen for skin cancer.  Please use sunscreen on a regular basis.

## 2023-02-01 DIAGNOSIS — L905 Scar conditions and fibrosis of skin: Secondary | ICD-10-CM | POA: Diagnosis not present

## 2023-02-02 ENCOUNTER — Ambulatory Visit (INDEPENDENT_AMBULATORY_CARE_PROVIDER_SITE_OTHER): Payer: Medicare HMO | Admitting: Pharmacist

## 2023-02-02 DIAGNOSIS — E1169 Type 2 diabetes mellitus with other specified complication: Secondary | ICD-10-CM

## 2023-02-02 DIAGNOSIS — I10 Essential (primary) hypertension: Secondary | ICD-10-CM

## 2023-02-02 DIAGNOSIS — E1140 Type 2 diabetes mellitus with diabetic neuropathy, unspecified: Secondary | ICD-10-CM

## 2023-02-02 DIAGNOSIS — E785 Hyperlipidemia, unspecified: Secondary | ICD-10-CM

## 2023-02-02 DIAGNOSIS — Z794 Long term (current) use of insulin: Secondary | ICD-10-CM

## 2023-02-02 NOTE — Progress Notes (Signed)
Pharmacy Note  02/02/2023 Name: Tommy Green MRN: [id number] DOB: [birth year]  Subjective: Tommy Green is a 77 y.o. year old male who is a primary care patient of Tommy Dory, DO. Clinical Pharmacist Practitioner referral was placed to assist with medication management.    Engaged with patient by telephone for follow up visit today.   DM, insulin dependent with CKD 3B:   Patient continues to follow up with Tommy Green who is now at [facility]. His last appointment was in 01/20/2023.  A1c was 6.9% on 01/20/2023  Current treatment: Tresiba 30 units daily and Novolog per sliding scale.  He checks blood glucose 3 to 4 times per day by fingerstick method. Uses Relion glucometer.  Discussed continuous Glucose Monitor in past with patient which I think would be a good option for him but he has declined.    Hypertension:  Current therapy: lisinopril and amlodipine Recent home blood pressure has been at goal, ranging form 110 to 130 / 65 to 90.  Patient endorses adherence to blood pressure medications.  Denies dizziness, chest pain or headache.    Hyperlipidemia:  Current therapy: ezetimibe 10mg  daily, fenofibrate 200mg  daily and omega 3 fatty acids 1000mg  daily (has not been able to tolerate higher doses of omega 3 FAs due to reflux and belching.  Checked [employer] coverage of prescription Omega 3 agents but they are not covered. He might see less belching / reflux with prescription strength but patient declined due to cost.  Past therapy: pravastatin, atorvastatin and lovastatin in past but stopped due to statin induced myalgias. Patient declines retrial of statin therapy.   CKD -  Was prescribed Tommy Green / finerenone by Tommy Tommy Green but per patient he never started due to cost. Confirmed with [facility] cost was $47 per 30 days but he would likely reach coverage gap. We would try to see if he could qualify for medication assistance program. Our pharmacy med assist team has  mailed patient application for Tommy Green and he is working on it.   Unfortunately he has tried SGLT2 in past and had acute kidney failure that was thought to have related to start of SGLTs.   He is currently receiving Tommy Green, Tommy Green and novolog thru medication assistance program.   Objective: Review of patient status, including review of consultants reports, laboratory and other test data, was performed as part of comprehensive.  Lab Results  Component Value Date   CREATININE 1.70 (H) 12/16/2022   CREATININE 2.03 (H) 10/14/2022   CREATININE 1.77 (H) 10/07/2022    Lab Results  Component Value Date   HGBA1C 6.9 09/23/2022       Component Value Date/Time   CHOL 173 06/29/2022 1456   TRIG 446 (H) 06/29/2022 1456   HDL 24 (L) 06/29/2022 1456   CHOLHDL 7.2 (H) 06/29/2022 1456   CHOLHDL 7 05/04/2021 0852   VLDL 63.0 (H) 05/04/2021 0852   LDLCALC 78 06/29/2022 1456   LDLDIRECT 107.0 05/04/2021 0852     Clinical ASCVD: Yes  The ASCVD Risk score (Arnett DK, et al., 2019) failed to calculate for the following reasons:   The patient has a prior MI or stroke diagnosis    BP Readings from Last 3 Encounters:  01/27/23 127/67  01/26/23 134/70  12/08/22 122/60     Allergies  Allergen Reactions   Canagliflozin Other (See Comments)    Pt reports that the use of Invokana caused acute KIDNEY FAILURE (Tommy Green) and "allergic," per Tommy Green   Aspirin  Other (See Comments)    Gastroesophageal reflux and "allergic," per MAR    Atorvastatin Other (See Comments)    Myalgias, GI upset, and "allergic," per Tommy Green   Statins Other (See Comments)    Muscle pain- "allergic," per Tommy Green    Medications Reviewed Today     Reviewed by Tommy Green, RPH-CPP (Pharmacist) on 02/02/23 at 1100  Med List Status: <None>   Medication Order Taking? Sig Documenting Provider Last Dose Status Informant  acetaminophen (TYLENOL) 325 MG tablet [id number] Yes Take 650 mg by mouth every 6 (six) hours as needed for  mild pain or headache. [provider] Taking Active Self  amLODipine (NORVASC) 5 MG tablet [id number] Yes TAKE 1 TABLET DAILY Tommy Dory, DO Taking Active   amoxicillin (AMOXIL) 500 MG tablet [id number]  Take 4 tablets (2,000 mg total) by mouth as directed. 1 HOUR PRIOR TO DENTAL APPOINTMENTS Tommy Daub, MD  Active   aspirin EC 81 MG tablet [id number] Yes Take 1 tablet (81 mg total) by mouth daily. Swallow whole. Tommy Chard D, NP Taking Active   Green Complex Vitamins (Green COMPLEX PO) [id number] Yes Take 1 tablet by mouth daily. [provider] Taking Active Self  Cholecalciferol (VITAMIN D3 SUPER STRENGTH) 50 MCG (2000 UT) CAPS [id number] Yes Take 2,000 Units by mouth daily. [provider] Taking Active Self  escitalopram (LEXAPRO) 10 MG tablet [id number] Yes Take 1 tablet (10 mg total) by mouth daily. Tommy Dory, DO Taking Active   Etanercept (ENBREL MINI) 50 MG/ML SOCT [id number] Yes Inject 50 mg into the skin once a week. Tommy Bienenstock, Tommy Green Taking Active   ezetimibe (ZETIA) 10 MG tablet [id number] Yes TAKE 1 TABLET DAILY FOR    ELEVATED CHOLESTEROL AND   TRIGLYCERIDES Tommy Dory, DO Taking Active   fenofibrate micronized (LOFIBRA) 200 MG capsule [id number] Yes TAKE 1 CAPSULE DAILY BEFOREBREAKFAST Tommy Dory, DO Taking Active   Finerenone 10 MG TABS [id number] No Take 1 tablet (10 mg total) by mouth daily.  Patient not taking: Reported on 02/02/2023   Tommy Dory, DO Not Taking Active            Med Note [facility], Tommy Green   Wed Feb 02, 2023 10:56 AM) Not taking due to cost - send patient assistance program application to patient 01/24/2023  fluticasone (FLONASE) 50 MCG/ACT nasal spray [id number] Yes Place 2 sprays into both nostrils daily. Tommy Dory, DO Taking Active            Med Note Tommy Green, Tommy Green Green   Wed Sep 22, 2022  9:33 AM) Using as needed  furosemide (LASIX) 80 MG tablet [id number] No TAKE 1  TABLET BY MOUTH EVERY DAY  Patient not taking: Reported on 02/02/2023   Tommy Dory, DO Not Taking Active   gabapentin (NEURONTIN) 100 MG capsule [id number] Yes TAKE 2 CAPSULES AT BEDTIME Tommy Dory, DO Taking Active   HYDROcodone-acetaminophen (NORCO) 7.5-325 MG tablet [id number] Yes Take 1 tablet by mouth every 6 (six) hours as needed for moderate pain. Tommy Dory, DO Taking Active   HYDROcodone-acetaminophen (NORCO) 7.5-325 MG tablet [id number]  Take 1 tablet by mouth every 6 (six) hours as needed for moderate pain. Tommy Dory, DO  Active   HYDROcodone-acetaminophen (NORCO) 7.5-325 MG tablet [id number]  Take 1 tablet by mouth every 6 (six) hours as needed for moderate pain. Tommy Dory, DO  Active   insulin aspart (NOVOLOG FLEXPEN) 100 UNIT/ML FlexPen  [id number] Yes Inject 0-10 Units into the skin See admin instructions. Inject 0-10 units into the skin before meals, PER SLIDING SCALE: BGL 0-200 = give nothing, 201-250 = 2 units, 251-300 = 4 units, 301-350 = 6 units, 351-400 = 8 units, >400 = 10 units, push fluids, and repeat BGL check in 2 hours. If BGL remains >400 after 2 hours, CALL MD. [provider] Taking Active Self  insulin degludec (TRESIBA FLEXTOUCH) 100 UNIT/ML FlexTouch Pen [id number] Yes Inject 30 Units into the skin daily. [provider] Taking Active   leflunomide (ARAVA) 10 MG tablet [id number] Yes Take 1 tablet (10 mg total) by mouth daily. Pollyann Savoy, MD Taking Active   lisinopril (ZESTRIL) 40 MG tablet [id number] Yes TAKE 1 TABLET IN THE       MORNING [place], Jilda Roche, DO Taking Active   magnesium oxide (MAG-OX) 400 MG tablet [id number] Yes Take 400 mg by mouth daily with lunch. [provider] Taking Active Self  melatonin 5 MG TABS [id number] Yes Take 5 mg by mouth at bedtime. [provider] Taking Active Self  Multiple Vitamins-Minerals (MULTIVITAMIN WITH MINERALS) tablet  [id number] Yes Take 1 tablet by mouth daily with breakfast. [provider] Taking Active Self  Omega 3 1000 MG CAPS [id number] Yes Take 1,000 mg by mouth daily with lunch. [provider] Taking Active Self  omeprazole (PRILOSEC) 20 MG capsule [id number] Yes TAKE 1 CAPSULE DAILY BEFOREBREAKFAST Tommy Dory, DO Taking Active   [facility] VERIO test strip [id number] No 4 (four) times daily.  Patient not taking: Reported on 02/02/2023   [provider] Not Taking Active Self           Med Note Tommy Green, Cindie Laroche Jan 06, 2023  9:32 AM) Per patient he has Relion meter  Potassium 99 MG TABS [id number] Yes Take 99 mg by mouth every evening. [provider] Taking Active Self  vitamin Green-12 (CYANOCOBALAMIN) 1000 MCG tablet [id number] Yes Take 1,000 mcg by mouth daily. [provider] Taking Active Self  vitamin E 180 MG (400 UNITS) capsule [id number] Yes Take 400 Units by mouth daily. [provider] Taking Active Self            Patient Active Problem List   Diagnosis Date Noted   IDA (iron deficiency anemia) 07/06/2022   Current mild episode of major depressive disorder without prior episode 05/17/2022   Other chronic pain 03/05/2022   Bilateral lower extremity edema 03/05/2022   Severe aortic stenosis 01/26/2022   S/P TAVR (transcatheter aortic valve replacement) 01/26/2022   Microcytic anemia 12/10/2021   Obesity (BMI 30-39.9) 12/10/2021   Near syncope 12/09/2021   Aortic stenosis 12/09/2021   Rheumatoid arthritis 12/09/2021   Foot drop, left foot 10/02/2021   History of colonic polyps 10/02/2021   History of rheumatic fever 10/02/2021   Stress fracture of left foot 09/03/2021   Gabapentin overdose, accidental or unintentional, initial encounter 03/08/2021   TIA (transient ischemic attack) 03/05/2021   Body mass index (BMI) 36.0-36.9, adult 10/03/2020   Subdural hemorrhage following injury without open intracranial wound and  with prolonged loss of consciousness (more than 24 hours) without return to pre-existing conscious level 10/03/2020   AKI (acute kidney injury)    Acute encephalopathy    SDH (subdural hematoma) 10/24/2019   Cyclic vomiting syndrome 08/06/2019   Neuropathy 12/25/2018   AK (actinic keratosis) 08/24/2018   Neoplasm of uncertain behavior 07/27/2018   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   Tendinopathy  of right gluteus medius 07/13/2018   Tendinopathy of left gluteus medius 07/13/2018   Squamous cell carcinoma in situ (SCCIS) of skin 03/24/2018   Vitamin Green deficiency 11/29/2017   Stage 3 chronic kidney disease 10/28/2017   Primary osteoarthritis of right hip 09/11/2017   Essential hypertension 07/18/2017   Hyperlipidemia associated with type 2 diabetes mellitus 07/18/2017   Type 2 diabetes mellitus with diabetic neuropathy, unspecified 07/18/2017   Heart murmur 07/18/2017   Hypertriglyceridemia 07/18/2017   History of stroke 07/18/2017   DDD (degenerative disc disease), lumbar 06/15/2017   Iron deficiency anemia 06/15/2017   Stroke 06/15/2017   Encounter for hepatitis C screening test for low risk patient 06/30/2016   Anemia 06/19/2016   Microalbuminuria due to type 2 diabetes mellitus 03/19/2016   Hypertrophy of inferior nasal turbinate 12/20/2015   Disease of nasal cavity and sinuses 12/10/2015   Degenerative tear of acetabular labrum of left hip 11/04/2015   History of tobacco abuse 05/15/2015   Morbid (severe) obesity due to excess calories 04/11/2015   Disorder of both eustachian tubes 01/27/2015   Maxillary sinusitis 01/13/2015   Lipoprotein deficiency 01/31/2014   Gastro-esophageal reflux disease without esophagitis 11/02/2013   Rheumatic fever 11/02/2013   Erectile dysfunction associated with type 2 diabetes mellitus 07/31/2013   Type 2 diabetes mellitus with hyperglycemia, without long-term current use of insulin 11/16/2012     Medication Assistance:   Patient gets [place] and  [employer] thru [employer] medication assistance program - application handled by his endocrinologist office - Tommy Green.    Assessment / Plan: DM, insulin dependent: Last A1c on file was at goal  Continue current therapy and follow up with Tommy Green.  Eye exam is UTD - last 01/06/2023  Hypertension: atient's home blood pressure has been at goal of < 130/80 Continue lisinopril and amlodipine Continue to check blood pressure daily and record  Hyperlipidemia: With history of TIA and DM - goal LDL < 55; Tg < 150 - not at goal. Noted to be statin intolerant.  Continue ezetimibe and fenofibrate. If LDL remains above goal - consider PCSK9 therapy. Continue omega 3 FA over-the-counter  daily.   CKD - patient did not start [place] due to cost.  Reminded patient to complete medication assistance program application for Tommy Green and return to either our office or Med Assistance Team   Medication management:  Reviewed and updated medication list Reviewed refill history and adherence   Follow Up:  Telephone follow up appointment with care management team member scheduled for:  2 to 3  months.   Tommy Green, PharmD Clinical Pharmacist [facility]  - [facility] [phone]

## 2023-02-03 ENCOUNTER — Encounter: Payer: Self-pay | Admitting: *Deleted

## 2023-02-04 ENCOUNTER — Encounter: Payer: Self-pay | Admitting: Cardiology

## 2023-02-04 ENCOUNTER — Ambulatory Visit: Payer: Medicare HMO | Attending: Cardiology | Admitting: Cardiology

## 2023-02-04 VITALS — BP 136/58 | HR 102 | Ht 72.0 in | Wt 227.0 lb

## 2023-02-04 DIAGNOSIS — Z87891 Personal history of nicotine dependence: Secondary | ICD-10-CM

## 2023-02-04 DIAGNOSIS — Z8673 Personal history of transient ischemic attack (TIA), and cerebral infarction without residual deficits: Secondary | ICD-10-CM

## 2023-02-04 DIAGNOSIS — Z6836 Body mass index (BMI) 36.0-36.9, adult: Secondary | ICD-10-CM

## 2023-02-04 DIAGNOSIS — I350 Nonrheumatic aortic (valve) stenosis: Secondary | ICD-10-CM

## 2023-02-04 DIAGNOSIS — Z952 Presence of prosthetic heart valve: Secondary | ICD-10-CM

## 2023-02-04 NOTE — Progress Notes (Signed)
Cardiology Office Note:    Date:  02/04/2023   ID:  Tommy Green, DOB [birth year], MRN [id number]  PCP:  [name], DO  Cardiologist:  Gypsy Balsam, MD    Referring MD: [name]*   Chief Complaint  Patient presents with   Echo results    History of Present Illness:    Tommy Green is a 77 y.o. male past medical history significant for severe arctic stenosis, status post TAVR January 26, 2022 with 29 mm Edwards SAPIEN S3 valve.  It was transfemoral approach.  Postoperative course show aortic valve area 2.25 cm with mean gradient of 11.8 and no paravalvular leak.  Additional problem include essential hypertension, dyslipidemia, chronic kidney failure. Comes today to months for follow-up overall seems to be doing well.  He does have some difficulty with balance he required 2 back surgeries years ago since that time he gets some neuropathy in lower extremities which make his gait unstable and that is why he uses walker.  Denies have any cardiac complaints.  There is no chest pain tightness squeezing pressure burning chest no palpitation no dizziness.  Past Medical History:  Diagnosis Date   Anemia    Essential hypertension 07/18/2017   Foot drop, left foot    due to back surgery in 01/2021   GERD (gastroesophageal reflux disease)    History of colonic polyps    History of hiatal hernia    many years ago   History of rheumatic fever    History of stroke 07/18/2017   Hypertriglyceridemia 07/18/2017   S/P TAVR (transcatheter aortic valve replacement) 01/26/2022   Edwards S3UR 29mm via TF approach with Dr. Lynnette Caffey and Dr. Laneta Simmers   Severe aortic stenosis    Type 2 diabetes mellitus with diabetic neuropathy, unspecified 07/18/2017    Past Surgical History:  Procedure Laterality Date   COLONOSCOPY     around 2018 [facility]   COLONOSCOPY  09/02/2020   CRANIOTOMY Left 10/25/2019   Procedure: CRANIOTOMY HEMATOMA EVACUATION SUBDURAL;  Surgeon:  Bedelia Person, MD;  Location[facility];  Service: Neurosurgery;  Laterality: Left;   ESOPHAGOGASTRODUODENOSCOPY     around 2018 with [facility]   FOOT CAPSULOTOMY Left 07/25/2008   Mid Foot #2 MPJ   Hammertoe Repair Left 07/25/2008   #2 toe   HAND SURGERY     nodule removed from left thumb and right index finger, Dr. Judithann Sheen   INTRAOPERATIVE TRANSTHORACIC ECHOCARDIOGRAM N/A 01/26/2022   Procedure: INTRAOPERATIVE TRANSTHORACIC ECHOCARDIOGRAM;  Surgeon: Orbie Pyo, MD;  Location: [facility] OR;  Service: Open Heart Surgery;  Laterality: N/A;   MOHS SURGERY  01/25/2023   right arm   RIGHT/LEFT HEART CATH AND CORONARY ANGIOGRAPHY N/A 12/11/2021   Procedure: RIGHT/LEFT HEART CATH AND CORONARY ANGIOGRAPHY;  Surgeon: Corky Crafts, MD;  Location: [facility] INVASIVE CV LAB;  Service: Cardiovascular;  Laterality: N/A;   SPINAL FUSION  01/2021   TARSAL TUNNEL RELEASE Left 07/25/2008   TONSILLECTOMY     removed as a child   TRANSCATHETER AORTIC VALVE REPLACEMENT, TRANSFEMORAL N/A 01/26/2022   Procedure: Transcatheter Aortic Valve Replacement , Transfemoral;  Surgeon: Orbie Pyo, MD;  Location: [facility] OR;  Service: Open Heart Surgery;  Laterality: N/A;  Percutaneous   UPPER GASTROINTESTINAL ENDOSCOPY  09/02/2020    Current Medications: Current Meds  Medication Sig   acetaminophen (TYLENOL) 325 MG tablet Take 650 mg by mouth every 6 (six) hours as needed for mild pain or headache.  amLODipine (NORVASC) 5 MG tablet TAKE 1 TABLET DAILY   amoxicillin (AMOXIL) 500 MG tablet Take 4 tablets (2,000 mg total) by mouth as directed. 1 HOUR PRIOR TO DENTAL APPOINTMENTS   aspirin EC 81 MG tablet Take 1 tablet (81 mg total) by mouth daily. Swallow whole.   B Complex Vitamins (B COMPLEX PO) Take 1 tablet by mouth daily.   Cholecalciferol (VITAMIN D3 SUPER STRENGTH) 50 MCG (2000 UT) CAPS Take 2,000 Units by mouth daily.   escitalopram (LEXAPRO) 10 MG tablet Take 1 tablet (10 mg total) by mouth daily.    Etanercept (ENBREL MINI) 50 MG/ML SOCT Inject 50 mg into the skin once a week.   ezetimibe (ZETIA) 10 MG tablet TAKE 1 TABLET DAILY FOR    ELEVATED CHOLESTEROL AND   TRIGLYCERIDES (Patient taking differently: Take 10 mg by mouth daily.)   fenofibrate micronized (LOFIBRA) 200 MG capsule TAKE 1 CAPSULE DAILY BEFOREBREAKFAST (Patient taking differently: Take 200 mg by mouth daily before breakfast.)   Finerenone 10 MG TABS Take 1 tablet (10 mg total) by mouth daily.   fluticasone (FLONASE) 50 MCG/ACT nasal spray Place 2 sprays into both nostrils daily.   furosemide (LASIX) 80 MG tablet TAKE 1 TABLET BY MOUTH EVERY DAY   gabapentin (NEURONTIN) 100 MG capsule TAKE 2 CAPSULES AT BEDTIME (Patient taking differently: Take 200 mg by mouth at bedtime.)   HYDROcodone-acetaminophen (NORCO) 7.5-325 MG tablet Take 1 tablet by mouth every 6 (six) hours as needed for moderate pain.   HYDROcodone-acetaminophen (NORCO) 7.5-325 MG tablet Take 1 tablet by mouth every 6 (six) hours as needed for moderate pain.   [START ON 02/06/2023] HYDROcodone-acetaminophen (NORCO) 7.5-325 MG tablet Take 1 tablet by mouth every 6 (six) hours as needed for moderate pain.   insulin aspart (NOVOLOG FLEXPEN) 100 UNIT/ML FlexPen Inject 0-10 Units into the skin See admin instructions. Inject 0-10 units into the skin before meals, PER SLIDING SCALE: BGL 0-200 = give nothing, 201-250 = 2 units, 251-300 = 4 units, 301-350 = 6 units, 351-400 = 8 units, >400 = 10 units, push fluids, and repeat BGL check in 2 hours. If BGL remains >400 after 2 hours, CALL MD.   insulin degludec (TRESIBA FLEXTOUCH) 100 UNIT/ML FlexTouch Pen Inject 30 Units into the skin daily.   leflunomide (ARAVA) 10 MG tablet Take 1 tablet (10 mg total) by mouth daily.   lisinopril (ZESTRIL) 40 MG tablet TAKE 1 TABLET IN THE       MORNING   magnesium oxide (MAG-OX) 400 MG tablet Take 400 mg by mouth daily with lunch.   melatonin 5 MG TABS Take 5 mg by mouth at bedtime.    Multiple Vitamins-Minerals (MULTIVITAMIN WITH MINERALS) tablet Take 1 tablet by mouth daily with breakfast.   Omega 3 1000 MG CAPS Take 1,000 mg by mouth daily with lunch.   omeprazole (PRILOSEC) 20 MG capsule TAKE 1 CAPSULE DAILY BEFOREBREAKFAST (Patient taking differently: Take 20 mg by mouth daily.)   ONETOUCH VERIO test strip 1 each by Other route as needed for other (Glucose).   Potassium 99 MG TABS Take 99 mg by mouth every evening.   vitamin B-12 (CYANOCOBALAMIN) 1000 MCG tablet Take 1,000 mcg by mouth daily.   vitamin E 180 MG (400 UNITS) capsule Take 400 Units by mouth daily.     Allergies:   Canagliflozin, Aspirin, Atorvastatin, and Statins   Social History   Socioeconomic History   Marital status: Married    Spouse name: Not on file  Number of children: 2   Years of education: Not on file   Highest education level: Not on file  Occupational History   Not on file  Tobacco Use   Smoking status: Former    Packs/day: 1.00    Years: 10.00    Additional pack years: 0.00    Total pack years: 10.00    Types: Cigarettes    Quit date: 83    Years since quitting: 51.3    Passive exposure: Past   Smokeless tobacco: Never  Vaping Use   Vaping Use: Never used  Substance and Sexual Activity   Alcohol use: Not Currently   Drug use: No   Sexual activity: Yes  Other Topics Concern   Not on file  Social History Narrative   Not on file   Social Determinants of Health   Financial Resource Strain: Low Risk  (08/18/2022)   Overall Financial Resource Strain (CARDIA)    Difficulty of Paying Living Expenses: Not hard at all  Food Insecurity: No Food Insecurity (08/18/2022)   Hunger Vital Sign    Worried About Running Out of Food in the Last Year: Never true    Ran Out of Food in the Last Year: Never true  Transportation Needs: No Transportation Needs (08/18/2022)   PRAPARE - Administrator, Civil Service (Medical): No    Lack of Transportation (Non-Medical): No   Physical Activity: Insufficiently Active (08/18/2022)   Exercise Vital Sign    Days of Exercise per Week: 2 days    Minutes of Exercise per Session: 20 min  Stress: No Stress Concern Present (08/18/2022)   [employer] of Occupational Health - Occupational Stress Questionnaire    Feeling of Stress : Not at all  Social Connections: Moderately Integrated (08/18/2022)   Social Connection and Isolation Panel [NHANES]    Frequency of Communication with Friends and Family: More than three times a week    Frequency of Social Gatherings with Friends and Family: More than three times a week    Attends Religious Services: More than 4 times per year    Active Member of [employer] or Organizations: No    Attends Banker Meetings: Never    Marital Status: Married     Family History: The patient's family history includes Alcoholism in his brother; Colon polyps in his mother; Healthy in his son and son; Hyperlipidemia in his father and mother. There is no history of Cancer, Colon cancer, Esophageal cancer, Rectal cancer, or Stomach cancer. ROS:   Please see the history of present illness.    All 14 point review of systems negative except as described per history of present illness  EKGs/Labs/Other Studies Reviewed:      Recent Labs: 12/16/2022: ALT 9; BUN 30; Creat 1.70; Hemoglobin 9.8; Platelets 303; Potassium 4.8; Sodium 137  Recent Lipid Panel    Component Value Date/Time   CHOL 173 06/29/2022 1456   TRIG 446 (H) 06/29/2022 1456   HDL 24 (L) 06/29/2022 1456   CHOLHDL 7.2 (H) 06/29/2022 1456   CHOLHDL 7 05/04/2021 0852   VLDL 63.0 (H) 05/04/2021 0852   LDLCALC 78 06/29/2022 1456   LDLDIRECT 107.0 05/04/2021 0852    Physical Exam:    VS:  BP (!) 136/58 (BP Location: Left Arm, Patient Position: Sitting)   Pulse (!) 102   Ht 6' (1.829 m)   Wt 227 lb (103 kg)   SpO2 96%   BMI 30.79 kg/m     Wt Readings from  Last 3 Encounters:  02/04/23 227 lb (103 kg)  01/27/23 226 lb  3.2 oz (102.6 kg)  01/26/23 226 lb 12.8 oz (102.9 kg)     GEN:  Well nourished, well developed in no acute distress HEENT: Normal NECK: No JVD; No carotid bruits LYMPHATICS: No lymphadenopathy CARDIAC: RRR, no murmurs, no rubs, no gallops RESPIRATORY:  Clear to auscultation without rales, wheezing or rhonchi  ABDOMEN: Soft, non-tender, non-distended MUSCULOSKELETAL:  No edema; No deformity  SKIN: Warm and dry LOWER EXTREMITIES: no swelling NEUROLOGIC:  Alert and oriented x 3 PSYCHIATRIC:  Normal affect   ASSESSMENT:    1. Nonrheumatic aortic valve stenosis   2. S/P TAVR (transcatheter aortic valve replacement)   3. Body mass index (BMI) 36.0-36.9, adult   4. History of stroke   5. History of tobacco abuse    PLAN:    In order of problems listed above:  Status post TAVI.  Valve functioning properly, echocardiogram done in January 26, 2023 showed normal left ventricle ejection fraction 55 to 60%, aortic valve functioning properly, mean gradient is basically unchanged.  12 mmHg. History of CVA.  Stable from that point review History of tobacco abuse denies having any smoking Rheumatoid arthritis which bothers him a lot.  This is actually majority of conversation with. Overall he seems to be doing well continue present management see him back in about a year   Medication Adjustments/Labs and Tests Ordered: Current medicines are reviewed at length with the patient today.  Concerns regarding medicines are outlined above.  Orders Placed This Encounter  Procedures   ECHOCARDIOGRAM COMPLETE   Medication changes: No orders of the defined types were placed in this encounter.   Signed, Georgeanna Lea, MD, [facility] 02/04/2023 3:39 PM    [facility]

## 2023-02-04 NOTE — Patient Instructions (Signed)
Medication Instructions:  Your physician recommends that you continue on your current medications as directed. Please refer to the Current Medication list given to you today.  *If you need a refill on your cardiac medications before your next appointment, please call your pharmacy*   Lab Work: None Ordered If you have labs (blood work) drawn today and your tests are completely normal, you will receive your results only by: MyChart Message (if you have MyChart) OR A paper copy in the mail If you have any lab test that is abnormal or we need to change your treatment, we will call you to review the results.   Testing/Procedures: Your physician has requested that you have an echocardiogram. Echocardiography is a painless test that uses sound waves to create images of your heart. It provides your doctor with information about the size and shape of your heart and how well your heart's chambers and valves are working. This procedure takes approximately one hour. There are no restrictions for this procedure. Please do NOT wear cologne, perfume, aftershave, or lotions (deodorant is allowed). Please arrive 15 minutes prior to your appointment time.    Follow-Up: At [facility], you and your health needs are our priority.  As part of our continuing mission to provide you with exceptional heart care, we have created designated Provider Care Teams.  These Care Teams include your primary Cardiologist (physician) and Advanced Practice Providers (APPs -  Physician Assistants and Nurse Practitioners) who all work together to provide you with the care you need, when you need it.  We recommend signing up for the patient portal called "MyChart".  Sign up information is provided on this After Visit Summary.  MyChart is used to connect with patients for Virtual Visits (Telemedicine).  Patients are able to view lab/test results, encounter notes, upcoming appointments, etc.  Non-urgent messages can be sent to your  provider as well.   To learn more about what you can do with MyChart, go to [email or web address].com.    Your next appointment:   12 month(s)  The format for your next appointment:   In Person  Provider:   Robert Krasowski, MD    Other Instructions NA  

## 2023-02-07 ENCOUNTER — Encounter: Payer: Self-pay | Admitting: Urology

## 2023-02-07 ENCOUNTER — Ambulatory Visit: Payer: Medicare HMO | Admitting: Urology

## 2023-02-07 VITALS — BP 150/78 | HR 88 | Ht 73.0 in | Wt 230.0 lb

## 2023-02-07 DIAGNOSIS — N3281 Overactive bladder: Secondary | ICD-10-CM | POA: Diagnosis not present

## 2023-02-07 DIAGNOSIS — R350 Frequency of micturition: Secondary | ICD-10-CM | POA: Diagnosis not present

## 2023-02-07 DIAGNOSIS — N401 Enlarged prostate with lower urinary tract symptoms: Secondary | ICD-10-CM | POA: Diagnosis not present

## 2023-02-07 DIAGNOSIS — N138 Other obstructive and reflux uropathy: Secondary | ICD-10-CM

## 2023-02-07 LAB — MICROSCOPIC EXAMINATION
Cast Type: NONE SEEN
Casts: NONE SEEN /lpf
Trichomonas, UA: NONE SEEN
Yeast, UA: NONE SEEN

## 2023-02-07 LAB — URINALYSIS, ROUTINE W REFLEX MICROSCOPIC
Bilirubin, UA: NEGATIVE
Glucose, UA: NEGATIVE
Ketones, UA: NEGATIVE
Leukocytes,UA: NEGATIVE
Nitrite, UA: NEGATIVE
RBC, UA: NEGATIVE
Specific Gravity, UA: 1.02 (ref 1.005–1.030)
Urobilinogen, Ur: 0.2 mg/dL (ref 0.2–1.0)
pH, UA: 5.5 (ref 5.0–7.5)

## 2023-02-07 LAB — BLADDER SCAN AMB NON-IMAGING

## 2023-02-07 MED ORDER — TAMSULOSIN HCL 0.4 MG PO CAPS
0.4000 mg | ORAL_CAPSULE | Freq: Every day | ORAL | 3 refills | Status: DC
Start: 2023-02-07 — End: 2023-11-21

## 2023-02-07 MED ORDER — FINASTERIDE 5 MG PO TABS: 5.0000 mg | ORAL_TABLET | Freq: Every day | ORAL | 3 refills | Status: AC

## 2023-02-07 NOTE — Progress Notes (Signed)
Assessment: 1. BPH with obstruction/lower urinary tract symptoms   2. OAB (overactive bladder)    3.  Abnl DRE  Plan: Today I had a long and detailed discussion with the patient and his wife regarding his lower urinary tract symptoms and options for further evaluation and treatment.  We also discussed the abnormal DRE and implications thereof.  Following our discussion he elects to begin new medical therapy with combination treatment for BPH with tamsulosin and finasteride.  Prescription was provided.  Nature of medications including potential adverse events and side effects reviewed.  Follow-up 4 months for recheck with symptom assessment.  Chief Complaint:  Chief Complaint  Patient presents with   Urinary Frequency    History of Present Illness:  Tommy Green is a 77 y.o. male who is seen in consultation from [place], DO for evaluation of LUTS. Patient is accompanied by his wife today. The patient has multiple serious medical comorbidities which make him high risk.  He has a very poor performance status and uses a walker for ambulation which is still quite difficult.  Patient reports longstanding lower urinary tract symptoms that are really progressed a lot over the last 4 years.  He has never seen a urologist previously.  No prior BPH treatment.  IPSS = 21 PVR = 66 mL Urinalysis negative DRE 50 g with a right mid lateral area of induration moderately suspicious No routine prior PSA testing.  Last PSA 2018 = 0.65   Past Medical History:  Past Medical History:  Diagnosis Date   Anemia    Essential hypertension 07/18/2017   Foot drop, left foot    due to back surgery in 01/2021   GERD (gastroesophageal reflux disease)    History of colonic polyps    History of hiatal hernia    many years ago   History of rheumatic fever    History of stroke 07/18/2017   Hypertriglyceridemia 07/18/2017   S/P TAVR (transcatheter aortic valve replacement)  01/26/2022   Edwards S3UR 29mm via TF approach with Dr. Lynnette Caffey and Dr. Laneta Simmers   Severe aortic stenosis    Type 2 diabetes mellitus with diabetic neuropathy, unspecified 07/18/2017    Past Surgical History:  Past Surgical History:  Procedure Laterality Date   COLONOSCOPY     around 2018 [facility]   COLONOSCOPY  09/02/2020   CRANIOTOMY Left 10/25/2019   Procedure: CRANIOTOMY HEMATOMA EVACUATION SUBDURAL;  Surgeon: Bedelia Person, MD;  Location: [facility] OR;  Service: Neurosurgery;  Laterality: Left;   ESOPHAGOGASTRODUODENOSCOPY     around 2018 with [facility]   FOOT CAPSULOTOMY Left 07/25/2008   Mid Foot #2 MPJ   Hammertoe Repair Left 07/25/2008   #2 toe   HAND SURGERY     nodule removed from left thumb and right index finger, Dr. Judithann Sheen   INTRAOPERATIVE TRANSTHORACIC ECHOCARDIOGRAM N/A 01/26/2022   Procedure: INTRAOPERATIVE TRANSTHORACIC ECHOCARDIOGRAM;  Surgeon: Orbie Pyo, MD;  Location: [facility] OR;  Service: Open Heart Surgery;  Laterality: N/A;   MOHS SURGERY  01/25/2023   right arm   RIGHT/LEFT HEART CATH AND CORONARY ANGIOGRAPHY N/A 12/11/2021   Procedure: RIGHT/LEFT HEART CATH AND CORONARY ANGIOGRAPHY;  Surgeon: Corky Crafts, MD;  Location: [facility] INVASIVE CV LAB;  Service: Cardiovascular;  Laterality: N/A;   SPINAL FUSION  01/2021   TARSAL TUNNEL RELEASE Left 07/25/2008   TONSILLECTOMY     removed as a child   TRANSCATHETER AORTIC VALVE REPLACEMENT, TRANSFEMORAL N/A 01/26/2022  Procedure: Transcatheter Aortic Valve Replacement , Transfemoral;  Surgeon: Orbie Pyo, MD;  Location: [facility] OR;  Service: Open Heart Surgery;  Laterality: N/A;  Percutaneous   UPPER GASTROINTESTINAL ENDOSCOPY  09/02/2020    Allergies:  Allergies  Allergen Reactions   Canagliflozin Other (See Comments)    Pt reports that the use of Invokana caused acute KIDNEY FAILURE ([facility] records) and "allergic," per [facility]   Aspirin Other (See Comments)    Gastroesophageal reflux and  "allergic," per [facility]    Atorvastatin Other (See Comments)    Myalgias, GI upset, and "allergic," per [facility]   Statins Other (See Comments)    Muscle pain- "allergic," per [facility]    Family History:  Family History  Problem Relation Age of Onset   Hyperlipidemia Mother    Colon polyps Mother    Hyperlipidemia Father    Alcoholism Brother    Healthy Son    Healthy Son    Cancer Neg Hx    Colon cancer Neg Hx    Esophageal cancer Neg Hx    Rectal cancer Neg Hx    Stomach cancer Neg Hx     Social History:  Social History   Tobacco Use   Smoking status: Former    Packs/day: 1.00    Years: 10.00    Additional pack years: 0.00    Total pack years: 10.00    Types: Cigarettes    Quit date: 1973    Years since quitting: 51.3    Passive exposure: Past   Smokeless tobacco: Never  Vaping Use   Vaping Use: Never used  Substance Use Topics   Alcohol use: Not Currently   Drug use: No    Review of symptoms:  Constitutional:  Negative for unexplained weight loss, night sweats, fever, chills ENT:  Negative for nose bleeds, sinus pain, painful swallowing CV:  Negative for chest pain, shortness of breath, exercise intolerance, palpitations, loss of consciousness Resp:  Negative for cough, wheezing, shortness of breath GI:  Negative for nausea, vomiting, diarrhea, bloody stools GU:  Positives noted in HPI; otherwise negative for gross hematuria, dysuria, urinary incontinence Neuro:  Negative for seizures, poor balance, limb weakness, slurred speech Psych:  Negative for lack of energy, depression, anxiety Endocrine:  Negative for polydipsia, polyuria, symptoms of hypoglycemia (dizziness, hunger, sweating) Hematologic:  Negative for anemia, purpura, petechia, prolonged or excessive bleeding, use of anticoagulants  Allergic:  Negative for difficulty breathing or choking as a result of exposure to anything; no shellfish allergy; no allergic response (rash/itch) to materials, foods  Physical  exam: BP (!) 150/78   Pulse 88   Ht  (1.854 m)   Wt 230 lb (104.3 kg)   BMI 30.34 kg/m  GENERAL APPEARANCE: Elderly feeble appearing white male no acute distress  GU: Normal external genitalia DRE: Normal sphincter tone; prostate is approximately 50 g with an area of induration involving the right mid lobe laterally moderately suspicious.  Results: Results for orders placed or performed in visit on 02/07/23 (from the past 24 hour(s))  Bladder Scan (Post Void Residual) in office   Collection Time: 02/07/23 11:18 AM  Result Value Ref Range   Scan Result 66ml

## 2023-02-08 NOTE — Addendum Note (Signed)
Addended by: Baldo Ash D on: 02/08/2023 08:06 AM   Modules accepted: Orders

## 2023-02-16 ENCOUNTER — Other Ambulatory Visit: Payer: Self-pay | Admitting: Family Medicine

## 2023-03-05 ENCOUNTER — Other Ambulatory Visit: Payer: Self-pay | Admitting: Family Medicine

## 2023-03-16 ENCOUNTER — Other Ambulatory Visit: Payer: Self-pay | Admitting: Rheumatology

## 2023-03-16 DIAGNOSIS — M0579 Rheumatoid arthritis with rheumatoid factor of multiple sites without organ or systems involvement: Secondary | ICD-10-CM

## 2023-03-16 NOTE — Telephone Encounter (Signed)
Last Fill: 12/17/2022  Labs: 12/16/2022 Glucose is elevated.  Creatinine is elevated and stable.  Hemoglobin is low and stable.   Next Visit: 06/29/2023  Last Visit: 01/27/2023  DX: Rheumatoid arthritis involving multiple sites with positive rheumatoid factor   Current Dose per office note on 01/27/2023: Arava 10 mg 1 tablet by mouth daily   Advised patient that he is due to update labs, provided patient with lab hours. Orders are in place.   Okay to refill Arava ?

## 2023-03-17 ENCOUNTER — Other Ambulatory Visit: Payer: Self-pay | Admitting: *Deleted

## 2023-03-17 DIAGNOSIS — Z79899 Other long term (current) drug therapy: Secondary | ICD-10-CM

## 2023-03-17 LAB — CBC WITH DIFFERENTIAL/PLATELET
MCHC: 31.8 g/dL — ABNORMAL LOW (ref 32.0–36.0)
MCV: 85.2 fL (ref 80.0–100.0)
Neutro Abs: 4381 cells/uL (ref 1500–7800)
RBC: 3.32 10*6/uL — ABNORMAL LOW (ref 4.20–5.80)
WBC: 7.7 10*3/uL (ref 3.8–10.8)

## 2023-03-18 LAB — COMPLETE METABOLIC PANEL WITH GFR
AG Ratio: 1.6 (calc) (ref 1.0–2.5)
ALT: 10 U/L (ref 9–46)
AST: 13 U/L (ref 10–35)
Albumin: 4 g/dL (ref 3.6–5.1)
Alkaline phosphatase (APISO): 44 U/L (ref 35–144)
BUN/Creatinine Ratio: 19 (calc) (ref 6–22)
BUN: 38 mg/dL — ABNORMAL HIGH (ref 7–25)
CO2: 22 mmol/L (ref 20–32)
Calcium: 9.2 mg/dL (ref 8.6–10.3)
Chloride: 104 mmol/L (ref 98–110)
Creat: 2 mg/dL — ABNORMAL HIGH (ref 0.70–1.28)
Globulin: 2.5 g/dL (calc) (ref 1.9–3.7)
Glucose, Bld: 179 mg/dL — ABNORMAL HIGH (ref 65–99)
Potassium: 5.4 mmol/L — ABNORMAL HIGH (ref 3.5–5.3)
Sodium: 135 mmol/L (ref 135–146)
Total Bilirubin: 0.3 mg/dL (ref 0.2–1.2)
Total Protein: 6.5 g/dL (ref 6.1–8.1)
eGFR: 33 mL/min/{1.73_m2} — ABNORMAL LOW (ref >=60)

## 2023-03-18 LAB — CBC WITH DIFFERENTIAL/PLATELET
Absolute Monocytes: 701 cells/uL (ref 200–950)
Basophils Absolute: 69 cells/uL (ref 0–200)
Basophils Relative: 0.9 %
Eosinophils Absolute: 400 cells/uL (ref 15–500)
Eosinophils Relative: 5.2 %
HCT: 28.3 % — ABNORMAL LOW (ref 38.5–50.0)
Hemoglobin: 9 g/dL — ABNORMAL LOW (ref 13.2–17.1)
Lymphs Abs: 2148 cells/uL (ref 850–3900)
MCH: 27.1 pg (ref 27.0–33.0)
MPV: 10.4 fL (ref 7.5–12.5)
Monocytes Relative: 9.1 %
Neutrophils Relative %: 56.9 %
Platelets: 308 10*3/uL (ref 140–400)
RDW: 13.9 % (ref 11.0–15.0)
Total Lymphocyte: 27.9 %

## 2023-03-18 NOTE — Progress Notes (Signed)
Glucose is elevated.  Creatinine is elevated and stable.  Potassium is mildly elevated.  Hemoglobin is low at 9.0, most likely due to kidney disease.  Please forward results to his PCP.

## 2023-03-22 ENCOUNTER — Other Ambulatory Visit: Payer: Self-pay | Admitting: *Deleted

## 2023-03-22 MED ORDER — ENBREL MINI 50 MG/ML ~~LOC~~ SOCT: 50.0000 mg | SUBCUTANEOUS | 0 refills | Status: DC

## 2023-03-22 NOTE — Telephone Encounter (Signed)
Last Fill: 08/25/2022  Labs: 03/17/2023 Glucose is elevated.  Creatinine is elevated and stable.  Potassium is mildly elevated.  Hemoglobin is low at 9.0, most likely due to kidney disease.  Please forward results to his PCP.   TB Gold: 08/10/2022  TB gold is negative.   Next Visit: 06/29/2023  Last Visit: 01/27/2023  DX: Rheumatoid arthritis involving multiple sites with positive rheumatoid factor   Current Dose per office note 01/27/2023: Enbrel mini 50 mg sq injections once weekly   Okay to refill Enbrel?

## 2023-04-02 ENCOUNTER — Other Ambulatory Visit: Payer: Self-pay | Admitting: Family Medicine

## 2023-04-06 ENCOUNTER — Telehealth: Payer: Self-pay | Admitting: Family Medicine

## 2023-04-06 ENCOUNTER — Other Ambulatory Visit: Payer: Self-pay | Admitting: Family Medicine

## 2023-04-06 MED ORDER — HYDROCODONE-ACETAMINOPHEN 7.5-325 MG PO TABS: 1.0000 | ORAL_TABLET | Freq: Four times a day (QID) | ORAL | 0 refills | Status: DC | PRN

## 2023-04-06 NOTE — Telephone Encounter (Signed)
Prescription Request  04/06/2023  Is this a "Controlled Substance" medicine? Yes  LOV: 12/08/2022  What is the name of the medication or equipment?   HYDROcodone-acetaminophen (NORCO) 7.5-325 MG tablet [[id number]]   Have you contacted your pharmacy to request a refill? No   Which pharmacy would you like this sent to?   [id number] - [place], [place] - [address] [place] [address] Phone: [phone] Fax: [phone]  Patient notified that their request is being sent to the clinical staff for review and that they should receive a response within 2 business days.   Please advise at Mobile [phone] (mobile)

## 2023-04-06 NOTE — Telephone Encounter (Signed)
Last OV--12/08/2022 Last RF--02/06/2023--#30 no refills

## 2023-05-09 ENCOUNTER — Other Ambulatory Visit: Payer: Self-pay | Admitting: Family Medicine

## 2023-05-09 DIAGNOSIS — J069 Acute upper respiratory infection, unspecified: Secondary | ICD-10-CM

## 2023-05-11 ENCOUNTER — Ambulatory Visit: Payer: Medicare HMO | Admitting: Pharmacist

## 2023-05-11 DIAGNOSIS — I10 Essential (primary) hypertension: Secondary | ICD-10-CM

## 2023-05-11 DIAGNOSIS — E1140 Type 2 diabetes mellitus with diabetic neuropathy, unspecified: Secondary | ICD-10-CM

## 2023-05-11 DIAGNOSIS — N1832 Chronic kidney disease, stage 3b: Secondary | ICD-10-CM

## 2023-05-11 DIAGNOSIS — E1169 Type 2 diabetes mellitus with other specified complication: Secondary | ICD-10-CM

## 2023-05-11 NOTE — Progress Notes (Signed)
Pharmacy Note  05/11/2023 Name: Tommy Green MRN: [id number] DOB: 07-26-1946  Subjective: Tommy Green is a 77 y.o. year old male who is a primary care patient of Tommy Dory, DO. Clinical Pharmacist Practitioner referral was placed to assist with medication management.    Engaged with patient by telephone for follow up visit today.   DM, insulin dependent with CKD 3B:   Patient continues to follow up with Dr Tommy Green at [facility]. His last appointment was in 04/25/2023 - appointment notes not available A1c was 6.9% on 01/20/2023 (likely was checked 04/25/2023 but uable to see results from Dr Tommy Green, patient reports blood glucose has been well controlled)  Current treatment: Tresiba 30 units daily and Novolog per sliding scale.  He checks blood glucose 3 to 4 times per day by fingerstick method. Uses Relion glucometer.  Discussed continuous Glucose Monitor in past with patient which I think would be a good option for him but he has declined.    Hypertension:  Current therapy: lisinopril and amlodipine Recent home blood pressure has been at goal, ranging form 120 to 140 / 60 to 70 Patient endorses adherence to blood pressure medications.  Denies dizziness, chest pain or headache.    BP Readings from Last 3 Encounters:  02/07/23 (!) 150/78  02/04/23 (!) 136/58  01/27/23 127/67    Hyperlipidemia:  Current therapy: ezetimibe 10mg  daily, fenofibrate 200mg  daily and omega 3 fatty acids 1000mg  daily (has not been able to tolerate higher doses of omega 3 FAs due to reflux and belching) Today he reports that he has not had any reflux recently and continues to take omeprazole 20mg  daily.  Earlier in 2024, checked [employer] coverage of prescription Omega 3 agents but not covered. He might see less belching / reflux with prescription strength but patient declined due to cost.   Past therapy: pravastatin, atorvastatin and lovastatin in past but stopped due to statin  induced myalgias. Patient declines retrial of statin therapy.   CKD - 3b Was prescribed Tommy Green / finerenone by Dr Tommy Green 12/08/2022 but patient he never started due to cost. [facility] cost was $47 per 30 days but he would likely reach coverage gap. We sent patient application for Tommy Green medication assistance program but he has not completed. Noted that Tommy Green / finerenone was removed from med list by Dr Tommy Green his urologist at last visit.   Unfortunately he has tried SGLT2 in past and had acute kidney failure that was thought to be related to start of SGLTs.   He is currently receiving Tommy Green, Tommy Green and Novolog thru medication assistance program.   Objective: Review of patient status, including review of consultants reports, laboratory and other test data, was performed as part of comprehensive.  Lab Results  Component Value Date   CREATININE 2.00 (H) 03/17/2023   CREATININE 1.70 (H) 12/16/2022   CREATININE 2.03 (H) 10/14/2022    Lab Results  Component Value Date   HGBA1C 6.9 09/23/2022       Component Value Date/Time   CHOL 173 06/29/2022 1456   TRIG 446 (H) 06/29/2022 1456   HDL 24 (L) 06/29/2022 1456   CHOLHDL 7.2 (H) 06/29/2022 1456   CHOLHDL 7 05/04/2021 0852   VLDL 63.0 (H) 05/04/2021 0852   LDLCALC 78 06/29/2022 1456   LDLDIRECT 107.0 05/04/2021 0852     Clinical ASCVD: Yes  The ASCVD Risk score (Arnett DK, et al., 2019) failed to calculate for the following reasons:   The patient  has a prior MI or stroke diagnosis    BP Readings from Last 3 Encounters:  02/07/23 (!) 150/78  02/04/23 (!) 136/58  01/27/23 127/67     Allergies  Allergen Reactions   Canagliflozin Other (See Comments)    Pt reports that the use of Tommy Green caused acute KIDNEY FAILURE ([facility] records) and "allergic," per [facility]   Aspirin Other (See Comments)    Gastroesophageal reflux and "allergic," per [facility]    Atorvastatin Other (See Comments)    Myalgias, GI upset, and  "allergic," per [facility]   Statins Other (See Comments)    Muscle pain- "allergic," per [facility]    Medications Reviewed Today   Medications were not reviewed in this encounter     Patient Active Problem List   Diagnosis Date Noted   IDA (iron deficiency anemia) 07/06/2022   Current mild episode of major depressive disorder without prior episode (HCC) 05/17/2022   Other chronic pain 03/05/2022   Bilateral lower extremity edema 03/05/2022   Severe aortic stenosis 01/26/2022   S/P TAVR (transcatheter aortic valve replacement) 01/26/2022   Microcytic anemia 12/10/2021   Obesity (BMI 30-39.9) 12/10/2021   Near syncope 12/09/2021   Aortic stenosis 12/09/2021   Rheumatoid arthritis (HCC) 12/09/2021   Foot drop, left foot 10/02/2021   History of colonic polyps 10/02/2021   History of rheumatic fever 10/02/2021   Stress fracture of left foot 09/03/2021   Gabapentin overdose, accidental or unintentional, initial encounter 03/08/2021   TIA (transient ischemic attack) 03/05/2021   Body mass index (BMI) 36.0-36.9, adult 10/03/2020   Subdural hemorrhage following injury without open intracranial wound and with prolonged loss of consciousness (more than 24 hours) without return to pre-existing conscious level (HCC) 10/03/2020   AKI (acute kidney injury) (HCC)    Acute encephalopathy    SDH (subdural hematoma) (HCC) 10/24/2019   Cyclic vomiting syndrome 08/06/2019   Neuropathy 12/25/2018   AK (actinic keratosis) 08/24/2018   Neoplasm of uncertain behavior 07/27/2018   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   Tendinopathy of right gluteus medius 07/13/2018   Tendinopathy of left gluteus medius 07/13/2018   Squamous cell carcinoma in situ (SCCIS) of skin 03/24/2018   Vitamin D deficiency 11/29/2017   Stage 3 chronic kidney disease (HCC) 10/28/2017   Primary osteoarthritis of right hip 09/11/2017   Essential hypertension 07/18/2017   Hyperlipidemia associated with type 2 diabetes mellitus (HCC) 07/18/2017    Type 2 diabetes mellitus with diabetic neuropathy, unspecified (HCC) 07/18/2017   Heart murmur 07/18/2017   Hypertriglyceridemia 07/18/2017   History of stroke 07/18/2017   DDD (degenerative disc disease), lumbar 06/15/2017   Iron deficiency anemia 06/15/2017   Stroke (HCC) 06/15/2017   Encounter for hepatitis C screening test for low risk patient 06/30/2016   Anemia 06/19/2016   Microalbuminuria due to type 2 diabetes mellitus (HCC) 03/19/2016   Hypertrophy of inferior nasal turbinate 12/20/2015   Disease of nasal cavity and sinuses 12/10/2015   Degenerative tear of acetabular labrum of left hip 11/04/2015   History of tobacco abuse 05/15/2015   Morbid (severe) obesity due to excess calories (HCC) 04/11/2015   Disorder of both eustachian tubes 01/27/2015   Maxillary sinusitis 01/13/2015   Lipoprotein deficiency 01/31/2014   Gastro-esophageal reflux disease without esophagitis 11/02/2013   Rheumatic fever 11/02/2013   Erectile dysfunction associated with type 2 diabetes mellitus (HCC) 07/31/2013   Type 2 diabetes mellitus with hyperglycemia, without long-term current use of insulin (HCC) 11/16/2012     Medication Assistance:   Patient gets Tommy Green  and Novolog thru [employer] medication assistance program - application handled by his endocrinologist office - Dr Tommy Green.    Assessment / Plan: DM, insulin dependent: Last A1c on file was at goal  Continue current therapy and follow up with Dr Tommy Green.  Eye exam is UTD - last 01/06/2023  Hypertension: Last 2 office blood pressure were not at goal but home blood pressure has been at goal of < 130/80 most of the time Continue lisinopril and amlodipine. Will see PCP in August - if blood pressure above goal, consider increasing amlodipine to 7.5-10mg  dally (monitor for edema) or change lisinopril to valsartan 320mg  daily.  Continue to check blood pressure daily and record  Hyperlipidemia: With history of TIA and DM - goal LDL < 55; Tg < 150 -  not at goal. Noted to be statin intolerant due to myalgias. Continue ezetimibe and fenofibrate. If LDL remains above goal - consider PCSK9 therapy. Continue omega 3 FA over-the-counter 1000mg  daily.  CKD 3b - patient did not start [place] due to cost.  Discussed completing medication assistance program application for [place]. Patient has declined.   Medication management:  Reviewed and updated medication list Reviewed refill history and adherence   Follow Up:  Telephone follow up appointment with care management team member scheduled for:  2 to 3  months.   [name], PharmD Clinical Pharmacist [facility]  - [facility] [phone]

## 2023-05-23 DIAGNOSIS — N183 Chronic kidney disease, stage 3 unspecified: Secondary | ICD-10-CM | POA: Diagnosis not present

## 2023-05-23 DIAGNOSIS — E782 Mixed hyperlipidemia: Secondary | ICD-10-CM | POA: Diagnosis not present

## 2023-05-23 DIAGNOSIS — E559 Vitamin D deficiency, unspecified: Secondary | ICD-10-CM | POA: Diagnosis not present

## 2023-05-23 DIAGNOSIS — E1165 Type 2 diabetes mellitus with hyperglycemia: Secondary | ICD-10-CM | POA: Diagnosis not present

## 2023-05-23 DIAGNOSIS — Z8673 Personal history of transient ischemic attack (TIA), and cerebral infarction without residual deficits: Secondary | ICD-10-CM | POA: Diagnosis not present

## 2023-05-23 DIAGNOSIS — D509 Iron deficiency anemia, unspecified: Secondary | ICD-10-CM | POA: Diagnosis not present

## 2023-05-23 DIAGNOSIS — E1140 Type 2 diabetes mellitus with diabetic neuropathy, unspecified: Secondary | ICD-10-CM | POA: Diagnosis not present

## 2023-05-23 LAB — HEMOGLOBIN A1C: Hemoglobin A1C: 6.7

## 2023-05-24 ENCOUNTER — Other Ambulatory Visit: Payer: Self-pay | Admitting: Family Medicine

## 2023-05-24 ENCOUNTER — Encounter: Payer: Self-pay | Admitting: Internal Medicine

## 2023-05-24 DIAGNOSIS — Z008 Encounter for other general examination: Secondary | ICD-10-CM | POA: Diagnosis not present

## 2023-05-24 DIAGNOSIS — E1142 Type 2 diabetes mellitus with diabetic polyneuropathy: Secondary | ICD-10-CM | POA: Diagnosis not present

## 2023-05-24 DIAGNOSIS — N189 Chronic kidney disease, unspecified: Secondary | ICD-10-CM | POA: Diagnosis not present

## 2023-05-24 DIAGNOSIS — N400 Enlarged prostate without lower urinary tract symptoms: Secondary | ICD-10-CM | POA: Diagnosis not present

## 2023-05-24 DIAGNOSIS — E669 Obesity, unspecified: Secondary | ICD-10-CM | POA: Diagnosis not present

## 2023-05-24 DIAGNOSIS — I129 Hypertensive chronic kidney disease with stage 1 through stage 4 chronic kidney disease, or unspecified chronic kidney disease: Secondary | ICD-10-CM | POA: Diagnosis not present

## 2023-05-24 DIAGNOSIS — J069 Acute upper respiratory infection, unspecified: Secondary | ICD-10-CM

## 2023-05-24 DIAGNOSIS — E785 Hyperlipidemia, unspecified: Secondary | ICD-10-CM | POA: Diagnosis not present

## 2023-05-24 DIAGNOSIS — N529 Male erectile dysfunction, unspecified: Secondary | ICD-10-CM | POA: Diagnosis not present

## 2023-05-24 DIAGNOSIS — R2681 Unsteadiness on feet: Secondary | ICD-10-CM | POA: Diagnosis not present

## 2023-05-24 DIAGNOSIS — Z794 Long term (current) use of insulin: Secondary | ICD-10-CM | POA: Diagnosis not present

## 2023-05-24 DIAGNOSIS — K219 Gastro-esophageal reflux disease without esophagitis: Secondary | ICD-10-CM | POA: Diagnosis not present

## 2023-05-24 DIAGNOSIS — Z79891 Long term (current) use of opiate analgesic: Secondary | ICD-10-CM | POA: Diagnosis not present

## 2023-05-24 DIAGNOSIS — E1122 Type 2 diabetes mellitus with diabetic chronic kidney disease: Secondary | ICD-10-CM | POA: Diagnosis not present

## 2023-06-01 ENCOUNTER — Encounter: Payer: Self-pay | Admitting: Urology

## 2023-06-01 ENCOUNTER — Ambulatory Visit: Payer: Medicare HMO | Admitting: Urology

## 2023-06-01 VITALS — BP 131/64 | HR 96 | Ht 71.0 in | Wt 225.0 lb

## 2023-06-01 DIAGNOSIS — N401 Enlarged prostate with lower urinary tract symptoms: Secondary | ICD-10-CM

## 2023-06-01 DIAGNOSIS — N138 Other obstructive and reflux uropathy: Secondary | ICD-10-CM | POA: Diagnosis not present

## 2023-06-01 NOTE — Progress Notes (Signed)
Assessment: 1. BPH with obstruction/lower urinary tract symptoms     Plan: Continue tamsulosin and finasteride FU 4mo Pvr and DRE on FU Consider oab med?  Chief Complaint: LUTS  HPI: Tommy Green is a 77 y.o. male who presents for continued evaluation of LUTS. Please see my note 02/07/2023 at the time of initial visit for detailed history and exam. In summary, patient has multiple serious medical comorbidities and a poor performance status.  He has longstanding lower urinary tract symptoms.  On examination he had an enlarged prostate with some right sided induration. Baseline PVR = 66 Baseline IPSS = 21 Patient has not had recent routine PSA testing.  Last PSA 2018 = 0.65  At the time of his visit in April 2024 patient was started on combination medical therapy for his lower urinary tract symptoms.  He was started on tamsulosin and finasteride. Patient reports significant improvement. IPSS= 10 Still getting up at night and also admits to drinking a lot of fluid at night.  Portions of the above documentation were copied from a prior visit for review purposes only.  Allergies: Allergies  Allergen Reactions   Canagliflozin Other (See Comments)    Pt reports that the use of Invokana caused acute KIDNEY FAILURE ([facility] records) and "allergic," per [facility]   Aspirin Other (See Comments)    Gastroesophageal reflux and "allergic," per [facility]    Atorvastatin Other (See Comments)    Myalgias, GI upset, and "allergic," per [facility]   Statins Other (See Comments)    Muscle pain- "allergic," per [facility]    PMH: Past Medical History:  Diagnosis Date   Anemia    Essential hypertension 07/18/2017   Foot drop, left foot    due to back surgery in 01/2021   GERD (gastroesophageal reflux disease)    History of colonic polyps    History of hiatal hernia    many years ago   History of rheumatic fever    History of stroke 07/18/2017   Hypertriglyceridemia 07/18/2017   S/P TAVR (transcatheter  aortic valve replacement) 01/26/2022   Edwards S3UR 29mm via TF approach with Dr. Lynnette Caffey and Dr. Laneta Simmers   Severe aortic stenosis    Type 2 diabetes mellitus with diabetic neuropathy, unspecified (HCC) 07/18/2017    PSH: Past Surgical History:  Procedure Laterality Date   COLONOSCOPY     around 2018 [facility]   COLONOSCOPY  09/02/2020   CRANIOTOMY Left 10/25/2019   Procedure: CRANIOTOMY HEMATOMA EVACUATION SUBDURAL;  Surgeon: Bedelia Person, MD;  Location: [facility] OR;  Service: Neurosurgery;  Laterality: Left;   ESOPHAGOGASTRODUODENOSCOPY     around 2018 with [facility]   FOOT CAPSULOTOMY Left 07/25/2008   Mid Foot #2 MPJ   Hammertoe Repair Left 07/25/2008   #2 toe   HAND SURGERY     nodule removed from left thumb and right index finger, Dr. Judithann Sheen   INTRAOPERATIVE TRANSTHORACIC ECHOCARDIOGRAM N/A 01/26/2022   Procedure: INTRAOPERATIVE TRANSTHORACIC ECHOCARDIOGRAM;  Surgeon: Orbie Pyo, MD;  Location: [facility] OR;  Service: Open Heart Surgery;  Laterality: N/A;   MOHS SURGERY  01/25/2023   right arm   RIGHT/LEFT HEART CATH AND CORONARY ANGIOGRAPHY N/A 12/11/2021   Procedure: RIGHT/LEFT HEART CATH AND CORONARY ANGIOGRAPHY;  Surgeon: Corky Crafts, MD;  Location: [facility] INVASIVE CV LAB;  Service: Cardiovascular;  Laterality: N/A;   SPINAL FUSION  01/2021   TARSAL TUNNEL RELEASE Left 07/25/2008   TONSILLECTOMY     removed as a child  TRANSCATHETER AORTIC VALVE REPLACEMENT, TRANSFEMORAL N/A 01/26/2022   Procedure: Transcatheter Aortic Valve Replacement , Transfemoral;  Surgeon: Orbie Pyo, MD;  Location: [facility] OR;  Service: Open Heart Surgery;  Laterality: N/A;  Percutaneous   UPPER GASTROINTESTINAL ENDOSCOPY  09/02/2020    SH: Social History   Tobacco Use   Smoking status: Former    Current packs/day: 0.00    Average packs/day: 1 pack/day for 10.0 years (10.0 ttl pk-yrs)    Types: Cigarettes    Start date: 1963    Quit date: 1973    Years since quitting:  51.6    Passive exposure: Past   Smokeless tobacco: Never  Vaping Use   Vaping status: Never Used  Substance Use Topics   Alcohol use: Not Currently   Drug use: No    ROS: Constitutional:  Negative for fever, chills, weight loss CV: Negative for chest pain, previous MI, hypertension Respiratory:  Negative for shortness of breath, wheezing, sleep apnea, frequent cough GI:  Negative for nausea, vomiting, bloody stool, GERD  PE: Vitals:   06/01/23 1119  BP: 131/64  Pulse: 96    GENERAL APPEARANCE:  Well appearing, well developed, well nourished, NAD    Results: UA neg for blood or infection

## 2023-06-02 LAB — URINALYSIS, ROUTINE W REFLEX MICROSCOPIC
Bilirubin, UA: NEGATIVE
Glucose, UA: NEGATIVE
Ketones, UA: NEGATIVE
Leukocytes,UA: NEGATIVE
Nitrite, UA: NEGATIVE
Specific Gravity, UA: 1.015 (ref 1.005–1.030)
Urobilinogen, Ur: 0.2 mg/dL (ref 0.2–1.0)
pH, UA: 5 (ref 5.0–7.5)

## 2023-06-02 LAB — MICROSCOPIC EXAMINATION

## 2023-06-08 ENCOUNTER — Ambulatory Visit: Payer: Medicare HMO | Admitting: Urology

## 2023-06-08 ENCOUNTER — Other Ambulatory Visit: Payer: Self-pay | Admitting: Family Medicine

## 2023-06-08 ENCOUNTER — Telehealth: Payer: Self-pay | Admitting: Family Medicine

## 2023-06-08 MED ORDER — OMEPRAZOLE 20 MG PO CPDR: 20.0000 mg | DELAYED_RELEASE_CAPSULE | Freq: Every day | ORAL | 3 refills | Status: DC

## 2023-06-08 NOTE — Telephone Encounter (Signed)
Called informed of PCP instructions. Verbalized agreement.

## 2023-06-08 NOTE — Telephone Encounter (Signed)
Dennie Bible (spouse DPR Ok) called stating that pts pharmacy does not have the 7.5mg  version of the Norco but did have the 10mg  version. She asked if it was possible for him to send in an updated script and they would cut it in half or if not, let her know she can call around to see if other pharmacies have it.

## 2023-06-10 ENCOUNTER — Other Ambulatory Visit: Payer: Self-pay

## 2023-06-10 DIAGNOSIS — M0579 Rheumatoid arthritis with rheumatoid factor of multiple sites without organ or systems involvement: Secondary | ICD-10-CM

## 2023-06-10 MED ORDER — LEFLUNOMIDE 10 MG PO TABS
10.0000 mg | ORAL_TABLET | Freq: Every day | ORAL | 0 refills | Status: DC
Start: 2023-06-10 — End: 2023-09-28

## 2023-06-10 NOTE — Telephone Encounter (Signed)
Patient's wife contacted the office to request a refill of the patient's leflunomide be sent to [facility].   Last Fill: 03/16/2023  Labs: 03/17/2023 Glucose is elevated.  Creatinine is elevated and stable.  Potassium is mildly elevated.  Hemoglobin is low at 9.0, most likely due to kidney disease.  Please forward results to his PCP.   Next Visit: 06/29/2023  Last Visit: 01/27/2023  DX: Rheumatoid arthritis involving multiple sites with positive rheumatoid factor   Current Dose per office note 01/27/2023: Arava 10 mg 1 tablet by mouth daily   Patient's wife said the patient will update labs at his upcomming office visit.   Okay to refill Arava ?

## 2023-06-15 ENCOUNTER — Encounter: Payer: Self-pay | Admitting: Family Medicine

## 2023-06-15 ENCOUNTER — Ambulatory Visit (INDEPENDENT_AMBULATORY_CARE_PROVIDER_SITE_OTHER): Payer: Medicare HMO | Admitting: Family Medicine

## 2023-06-15 VITALS — BP 124/80 | HR 96 | Temp 97.5°F | Ht 71.0 in | Wt 224.1 lb

## 2023-06-15 DIAGNOSIS — L570 Actinic keratosis: Secondary | ICD-10-CM

## 2023-06-15 DIAGNOSIS — Z0000 Encounter for general adult medical examination without abnormal findings: Secondary | ICD-10-CM

## 2023-06-15 DIAGNOSIS — Z794 Long term (current) use of insulin: Secondary | ICD-10-CM | POA: Diagnosis not present

## 2023-06-15 DIAGNOSIS — Z79899 Other long term (current) drug therapy: Secondary | ICD-10-CM

## 2023-06-15 DIAGNOSIS — R053 Chronic cough: Secondary | ICD-10-CM

## 2023-06-15 DIAGNOSIS — G720 Drug-induced myopathy: Secondary | ICD-10-CM

## 2023-06-15 DIAGNOSIS — E1140 Type 2 diabetes mellitus with diabetic neuropathy, unspecified: Secondary | ICD-10-CM | POA: Diagnosis not present

## 2023-06-15 DIAGNOSIS — T466X5A Adverse effect of antihyperlipidemic and antiarteriosclerotic drugs, initial encounter: Secondary | ICD-10-CM | POA: Diagnosis not present

## 2023-06-15 DIAGNOSIS — D489 Neoplasm of uncertain behavior, unspecified: Secondary | ICD-10-CM

## 2023-06-15 LAB — CBC
HCT: 28.4 % — ABNORMAL LOW (ref 39.0–52.0)
Hemoglobin: 9.2 g/dL — ABNORMAL LOW (ref 13.0–17.0)
MCHC: 32.6 g/dL (ref 30.0–36.0)
MCV: 85.3 fl (ref 78.0–100.0)
Platelets: 271 10*3/uL (ref 150.0–400.0)
RBC: 3.33 Mil/uL — ABNORMAL LOW (ref 4.22–5.81)
RDW: 15 % (ref 11.5–15.5)
WBC: 8.5 10*3/uL (ref 4.0–10.5)

## 2023-06-15 LAB — COMPREHENSIVE METABOLIC PANEL
ALT: 14 U/L (ref 0–53)
AST: 19 U/L (ref 0–37)
Albumin: 3.6 g/dL (ref 3.5–5.2)
Alkaline Phosphatase: 35 U/L — ABNORMAL LOW (ref 39–117)
BUN: 36 mg/dL — ABNORMAL HIGH (ref 6–23)
CO2: 22 mEq/L (ref 19–32)
Calcium: 9.8 mg/dL (ref 8.4–10.5)
Chloride: 102 mEq/L (ref 96–112)
Creatinine, Ser: 2.13 mg/dL — ABNORMAL HIGH (ref 0.40–1.50)
GFR: 28.91 mL/min — ABNORMAL LOW (ref >60.00)
Glucose, Bld: 150 mg/dL — ABNORMAL HIGH (ref 70–99)
Potassium: 4.7 mEq/L (ref 3.5–5.1)
Sodium: 134 mEq/L — ABNORMAL LOW (ref 135–145)
Total Bilirubin: 0.3 mg/dL (ref 0.2–1.2)
Total Protein: 6.2 g/dL (ref 6.0–8.3)

## 2023-06-15 LAB — LDL CHOLESTEROL, DIRECT: Direct LDL: 84 mg/dL

## 2023-06-15 LAB — LIPID PANEL
Cholesterol: 161 mg/dL (ref 0–200)
HDL: 18.8 mg/dL — ABNORMAL LOW (ref >39.00)
Total CHOL/HDL Ratio: 9
Triglycerides: 611 mg/dL — ABNORMAL HIGH (ref 0.0–149.0)

## 2023-06-15 LAB — MICROALBUMIN / CREATININE URINE RATIO
Creatinine,U: 71.3 mg/dL
Microalb Creat Ratio: 12.6 mg/g (ref 0.0–30.0)
Microalb, Ur: 9 mg/dL — ABNORMAL HIGH (ref 0.0–1.9)

## 2023-06-15 NOTE — Progress Notes (Unsigned)
Office Visit Note  Patient: Tommy Green             Date of Birth: 09-Mar-1946           MRN: [id number]             PCP: [name], DO Referring: [name]* Visit Date: 06/29/2023 Occupation: @GUAROCC @  Subjective:  Rheumatoid nodules   History of Present Illness: Tommy Green is a 77 y.o. male with history of seropositive rheumatoid arthritis and osteoarthritis.  Patient remains on Arava 10 mg 1 tablet by mouth daily and Enbrel mini 50 mg sq injections once weekly.  He is tolerating combination therapy without any side effects or injection site reactions from Enbrel.  He has not missed any doses recently.  Patient reports that his symptoms have been stable on the current treatment regimen.  He continues to have rheumatoid nodules overlying his elbows and both hands which have been unchanged.  He denies any joint swelling but has had some increased soreness especially in the right hand.  He has been using a walker to help assist with ambulation.  He denies any recent or recurrent infections.  He is planning on getting annual flu shot.   Activities of Daily Living:  Patient reports morning stiffness for 20-30 minutes.   Patient Denies nocturnal pain.  Difficulty dressing/grooming: Reports Difficulty climbing stairs: Reports Difficulty getting out of chair: Reports Difficulty using hands for taps, buttons, cutlery, and/or writing: Denies  Review of Systems  Constitutional:  Positive for fatigue.  HENT:  Positive for mouth dryness. Negative for mouth sores.   Eyes:  Negative for dryness.  Respiratory:  Positive for cough and shortness of breath. Negative for wheezing.   Cardiovascular:  Negative for chest pain and palpitations.  Gastrointestinal:  Negative for blood in stool, constipation and diarrhea.  Endocrine: Negative for increased urination.  Genitourinary:  Negative for involuntary urination.  Musculoskeletal:  Positive for joint pain, gait  problem, joint pain, muscle weakness and morning stiffness. Negative for joint swelling, myalgias, muscle tenderness and myalgias.  Skin:  Positive for rash. Negative for color change, hair loss and sensitivity to sunlight.  Allergic/Immunologic: Negative for susceptible to infections.  Neurological:  Positive for headaches. Negative for dizziness.  Hematological:  Negative for swollen glands.  Psychiatric/Behavioral:  Negative for depressed mood and sleep disturbance. The patient is not nervous/anxious.     PMFS History:  Patient Active Problem List   Diagnosis Date Noted   Statin myopathy 06/15/2023   IDA (iron deficiency anemia) 07/06/2022   Current mild episode of major depressive disorder without prior episode (HCC) 05/17/2022   Other chronic pain 03/05/2022   Bilateral lower extremity edema 03/05/2022   Severe aortic stenosis 01/26/2022   S/P TAVR (transcatheter aortic valve replacement) 01/26/2022   Microcytic anemia 12/10/2021   Obesity (BMI 30-39.9) 12/10/2021   Near syncope 12/09/2021   Aortic stenosis 12/09/2021   Rheumatoid arthritis (HCC) 12/09/2021   Foot drop, left foot 10/02/2021   History of colonic polyps 10/02/2021   History of rheumatic fever 10/02/2021   Stress fracture of left foot 09/03/2021   Gabapentin overdose, accidental or unintentional, initial encounter 03/08/2021   TIA (transient ischemic attack) 03/05/2021   Body mass index (BMI) 36.0-36.9, adult 10/03/2020   Subdural hemorrhage following injury without open intracranial wound and with prolonged loss of consciousness (more than 24 hours) without return to pre-existing conscious level (HCC) 10/03/2020   AKI (acute kidney injury) (HCC)  Acute encephalopathy    SDH (subdural hematoma) (HCC) 10/24/2019   Cyclic vomiting syndrome 08/06/2019   Neuropathy 12/25/2018   AK (actinic keratosis) 08/24/2018   Neoplasm of uncertain behavior 07/27/2018   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   Tendinopathy of right  gluteus medius 07/13/2018   Tendinopathy of left gluteus medius 07/13/2018   Squamous cell carcinoma in situ (SCCIS) of skin 03/24/2018   Vitamin D deficiency 11/29/2017   Stage 3 chronic kidney disease (HCC) 10/28/2017   Primary osteoarthritis of right hip 09/11/2017   Essential hypertension 07/18/2017   Hyperlipidemia associated with type 2 diabetes mellitus (HCC) 07/18/2017   Type 2 diabetes mellitus with diabetic neuropathy, unspecified (HCC) 07/18/2017   Heart murmur 07/18/2017   Hypertriglyceridemia 07/18/2017   History of stroke 07/18/2017   DDD (degenerative disc disease), lumbar 06/15/2017   Iron deficiency anemia 06/15/2017   Stroke (HCC) 06/15/2017   Encounter for hepatitis C screening test for low risk patient 06/30/2016   Anemia 06/19/2016   Microalbuminuria due to type 2 diabetes mellitus (HCC) 03/19/2016   Hypertrophy of inferior nasal turbinate 12/20/2015   Disease of nasal cavity and sinuses 12/10/2015   Degenerative tear of acetabular labrum of left hip 11/04/2015   History of tobacco abuse 05/15/2015   Morbid (severe) obesity due to excess calories (HCC) 04/11/2015   Disorder of both eustachian tubes 01/27/2015   Maxillary sinusitis 01/13/2015   Lipoprotein deficiency 01/31/2014   Gastro-esophageal reflux disease without esophagitis 11/02/2013   Rheumatic fever 11/02/2013   Erectile dysfunction associated with type 2 diabetes mellitus (HCC) 07/31/2013   Type 2 diabetes mellitus with hyperglycemia, without long-term current use of insulin (HCC) 11/16/2012    Past Medical History:  Diagnosis Date   Anemia    Essential hypertension 07/18/2017   Foot drop, left foot    due to back surgery in 01/2021   GERD (gastroesophageal reflux disease)    History of colonic polyps    History of hiatal hernia    many years ago   History of rheumatic fever    History of stroke 07/18/2017   Hypertriglyceridemia 07/18/2017   S/P TAVR (transcatheter aortic valve replacement)  01/26/2022   [name] 29mm via TF approach with Dr. Lynnette Caffey and Dr. Laneta Simmers   Severe aortic stenosis    Type 2 diabetes mellitus with diabetic neuropathy, unspecified (HCC) 07/18/2017    Family History  Problem Relation Age of Onset   Hyperlipidemia Mother    Colon polyps Mother    Hyperlipidemia Father    Alcoholism Brother    Healthy Son    Healthy Son    Cancer Neg Hx    Colon cancer Neg Hx    Esophageal cancer Neg Hx    Rectal cancer Neg Hx    Stomach cancer Neg Hx    Past Surgical History:  Procedure Laterality Date   COLONOSCOPY     around 2018 [facility]   COLONOSCOPY  09/02/2020   CRANIOTOMY Left 10/25/2019   Procedure: CRANIOTOMY HEMATOMA EVACUATION SUBDURAL;  Surgeon: Bedelia Person, MD;  Location: [facility] OR;  Service: Neurosurgery;  Laterality: Left;   ESOPHAGOGASTRODUODENOSCOPY     around 2018 with [facility]   FOOT CAPSULOTOMY Left 07/25/2008   Mid Foot #2 MPJ   Hammertoe Repair Left 07/25/2008   #2 toe   HAND SURGERY     nodule removed from left thumb and right index finger, Dr. Judithann Sheen   INTRAOPERATIVE TRANSTHORACIC ECHOCARDIOGRAM N/A 01/26/2022   Procedure: INTRAOPERATIVE TRANSTHORACIC ECHOCARDIOGRAM;  Surgeon: Orbie Pyo, MD;  Location: [facility] OR;  Service: Open Heart Surgery;  Laterality: N/A;   MOHS SURGERY  01/25/2023   right arm   RIGHT/LEFT HEART CATH AND CORONARY ANGIOGRAPHY N/A 12/11/2021   Procedure: RIGHT/LEFT HEART CATH AND CORONARY ANGIOGRAPHY;  Surgeon: Corky Crafts, MD;  Location: [facility] INVASIVE CV LAB;  Service: Cardiovascular;  Laterality: N/A;   SPINAL FUSION  01/2021   TARSAL TUNNEL RELEASE Left 07/25/2008   TONSILLECTOMY     removed as a child   TRANSCATHETER AORTIC VALVE REPLACEMENT, TRANSFEMORAL N/A 01/26/2022   Procedure: Transcatheter Aortic Valve Replacement , Transfemoral;  Surgeon: Orbie Pyo, MD;  Location: [facility] OR;  Service: Open Heart Surgery;  Laterality: N/A;  Percutaneous   UPPER GASTROINTESTINAL  ENDOSCOPY  09/02/2020   Social History   Social History Narrative   Not on file   Immunization History  Administered Date(s) Administered   Fluad Quad(high Dose 65+) 08/31/2021, 09/23/2022   Influenza, High Dose Seasonal PF 07/31/2013   Influenza-Unspecified 07/31/2013, 04/17/2014   Moderna Sars-Covid-2 Vaccination 12/11/2019, 01/10/2020, 08/08/2020   Pneumococcal Conjugate-13 08/07/2015   Pneumococcal Polysaccharide-23 04/17/2013, 07/18/2013   Td 12/25/2018   Zoster, Live 04/17/2013     Objective: Vital Signs: BP 131/72 (BP Location: Left Arm, Patient Position: Sitting, Cuff Size: Normal)   Pulse 88   Ht 5\' 11"  (1.803 m)   Wt 220 lb 9.6 oz (100.1 kg)   BMI 30.77 kg/m    Physical Exam Vitals and nursing note reviewed.  Constitutional:      Appearance: He is well-developed.  HENT:     Head: Normocephalic and atraumatic.  Eyes:     Conjunctiva/sclera: Conjunctivae normal.     Pupils: Pupils are equal, round, and reactive to light.  Cardiovascular:     Rate and Rhythm: Normal rate and regular rhythm.     Heart sounds: Murmur heard.  Pulmonary:     Effort: Pulmonary effort is normal.     Breath sounds: Normal breath sounds.  Abdominal:     General: Bowel sounds are normal.     Palpations: Abdomen is soft.  Musculoskeletal:     Cervical back: Normal range of motion and neck supple.  Skin:    General: Skin is warm and dry.     Capillary Refill: Capillary refill takes less than 2 seconds.  Neurological:     Mental Status: He is alert and oriented to person, place, and time.  Psychiatric:        Behavior: Behavior normal.      Musculoskeletal Exam: Patient remained seated during the examination today.  C-spine has limited ROM with lateral rotation.  Postural thoracic kyphosis noted.  Shoulder joints have good ROM.  Rheumatoid nodules noted on extensor surface of both elbows and overlying hands.  Right elbow flexion contracture unchanged. PIP and DIP thickening  consistent with OA of both hands.  No synovitis over MCPs.  Tenderness of the right 3rd and 4th PIP joints.  Hip joints difficult to assess in seated position.  Knee joints have crepitus with ROM but no warmth or effusion noted. Pitting edema noted in bilateral LE.   CDAI Exam: CDAI Score: -- Patient Global: 40 / 100; Provider Global: 40 / 100 Swollen: --; Tender: -- Joint Exam 06/29/2023   No joint exam has been documented for this visit   There is currently no information documented on the homunculus. Go to the Rheumatology activity and complete the homunculus joint exam.  Investigation: No additional findings.  Imaging: No results found.  Recent Labs: Lab Results  Component Value Date   WBC 8.5 06/15/2023   HGB 9.2 (L) 06/15/2023   PLT 271.0 06/15/2023   NA 134 (L) 06/15/2023   K 4.7 06/15/2023   CL 102 06/15/2023   CO2 22 06/15/2023   GLUCOSE 150 (H) 06/15/2023   BUN 36 (H) 06/15/2023   CREATININE 2.13 (H) 06/15/2023   BILITOT 0.3 06/15/2023   ALKPHOS 35 (L) 06/15/2023   AST 19 06/15/2023   ALT 14 06/15/2023   PROT 6.2 06/15/2023   ALBUMIN 3.6 06/15/2023   CALCIUM 9.8 06/15/2023   GFRAA 48 (L) 07/15/2020   QFTBGOLDPLUS NEGATIVE 08/10/2022    Speciality Comments: Enbrel 05/2020  Procedures:  No procedures performed Allergies: Canagliflozin, Aspirin, Atorvastatin, and Statins    Assessment / Plan:     Visit Diagnoses: Rheumatoid arthritis involving multiple sites with positive rheumatoid factor (HCC) - Positive RF, positive anti-CCP, elevated sedimentation rate, history of inflammatory arthritis: He has no synovitis on examination today.  He remains on Enbrel 50 mg sq injections once weekly along with Arava 10 mg daily.  He has been tolerating combination therapy without any side effects and has not had any interruptions in therapy.  He continues to experience intermittent soreness and stiffness especially in his right hand but has not noticed any inflammation.   There was discussion at his last office visit about switching from Enbrel to either Orencia or Actemra given his history of recurrent skin cancer but he was apprehensive to make any medication changes at that time.  He continues to find Enbrel to be effective at managing his arthritis and is apprehensive to make any medication changes.  Plan to update x-rays of both hands and both feet to assess for any radiographic progression.  He was vies notify us if he develops signs or symptoms of a flare.  He will follow-up in the office in 5 months or sooner if needed.- Plan: XR Hand 2 View Left, XR Hand 2 View Right, XR Foot 2 Views Right, XR Foot 2 Views Left  High risk medication use - Arava 10 mg 1 tablet by mouth daily and Enbrel mini 50 mg sq injections once weekly. CBC and CMP updated on 06/15/23.  His next lab work will be due in November and every 3 months.   TB gold negative on 08/10/22.  Future order for TB gold placed today.  Discussed the importance of holding arava and enbrel if he develops signs or symptoms of an infection and to resume once the infection has completely cleared.  Plans to receive the annual flu shot. He follows up closely with dermatology due to history of skin cancer.  Patient is apprehensive to switch from Enbrel to Orencia or Actemra at this time. - Plan: QuantiFERON-TB Gold Plus  Screening for tuberculosis -Future order for TB gold placed today.  Plan: QuantiFERON-TB Gold Plus  Rheumatoid nodulosis [facility]): Multiple nodules overlying the extensor surface of both elbows and both hands.  Previously has had some nodules excised.  Contracture of right elbow: Unchanged.  Primary osteoarthritis of both hips - Moderate to severe osteoarthritis of both hips.  Difficult to assess range of motion in seated physician today.  Primary osteoarthritis of left knee: No warmth or effusion noted.  Chronic SI joint pain: Using walker to assist with ambulation.  Pain in left ankle and  joints of left foot  DDD (degenerative disc disease), lumbar - L4-L5 fusion by Dr. Noel Gerold.  Using walker  to assist with ambulation.  Other medical conditions are listed as follows:  SDH (subdural hematoma) (HCC)  S/P TAVR (transcatheter aortic valve replacement)  Essential hypertension: Blood pressure was 143/73 today in the office and was rechecked prior to leaving at which time it was 131/72.  Hyperlipidemia associated with type 2 diabetes mellitus (HCC)  Type 2 diabetes mellitus with diabetic neuropathy, with long-term current use of insulin (HCC)  Granuloma annulare  Squamous cell carcinoma in situ (SCCIS) of skin  History of stroke  Heart murmur   Orders: Orders Placed This Encounter  Procedures   XR Hand 2 View Left   XR Hand 2 View Right   XR Foot 2 Views Right   XR Foot 2 Views Left   QuantiFERON-TB Gold Plus   No orders of the defined types were placed in this encounter.   Follow-Up Instructions: Return in about 5 months (around 11/29/2023) for Rheumatoid arthritis, Osteoarthritis.   Gearldine Bienenstock, PA-C  Note - This record has been created using Dragon software.  Chart creation errors have been sought, but may not always  have been located. Such creation errors do not reflect on  the standard of medical care.

## 2023-06-15 NOTE — Patient Instructions (Addendum)
Give [place] 2-3 business days to get the results of your labs back.   Keep the diet clean and stay active.  I recommend getting the flu shot in mid October. This suggestion would change if the CDC comes out with a different recommendation.   Please get me a copy of your advanced directive form at your convenience.   Do not shower for the rest of the day. When you do wash it, use only soap and water. Do not vigorously scrub. Apply triple antibiotic ointment (like Neosporin) twice daily. Keep the area clean and dry.   Things to look out for: increasing pain not relieved by ibuprofen/acetaminophen, fevers, spreading redness, drainage of pus, or foul odor.  Let's stop the lisinopril. In the next 5-6 weeks if no better, please return to discuss our next steps.   Give [place] 1 week to get the results of your biopsy back.  Let us know if you need anything.

## 2023-06-15 NOTE — Progress Notes (Signed)
Chief Complaint  Patient presents with   Annual Exam    Well Male Tommy Green is here for a complete physical. Here w wife.   His last physical was >1 year ago.  Current diet: in general, a "healthy" diet.   Current exercise: walking Weight trend: stable Fatigue out of ordinary? No. Seat belt? Yes.   Advanced directive? You  Health maintenance Shingrix- No Colonoscopy- Yes Tetanus- Yes Hep C- Yes Pneumonia vaccine- Yes  Skin lesion: The patient has had skin lesions on his legs for the past 2 years.  They no longer hurt or itch.  They are scaly.  There is no drainage.  He has tried topical steroids without significant relief.  Does not seem to be changing.  When treating in the past, I mentioned if it did not improve, we would need to take a biopsy and both he and his wife would like to do that today.  Chronic cough: This is been going on for 2 months.  No wheezing, shortness of breath, upper respiratory symptoms, new swelling, unintended weight change, nasal congestion/draining, association with meals or position.  It is dry.  He is on lisinopril.  He tried over-the-counter cough medication with did help for a period of time but did not keep things from recurring.  Past Medical History:  Diagnosis Date   Anemia    Essential hypertension 07/18/2017   Foot drop, left foot    due to back surgery in 01/2021   GERD (gastroesophageal reflux disease)    History of colonic polyps    History of hiatal hernia    many years ago   History of rheumatic fever    History of stroke 07/18/2017   Hypertriglyceridemia 07/18/2017   S/P TAVR (transcatheter aortic valve replacement) 01/26/2022   Edwards S3UR 29mm via TF approach with Dr. Lynnette Caffey and Dr. Laneta Simmers   Severe aortic stenosis    Type 2 diabetes mellitus with diabetic neuropathy, unspecified (HCC) 07/18/2017     Past Surgical History:  Procedure Laterality Date   COLONOSCOPY     around 2018 [facility]   COLONOSCOPY   09/02/2020   CRANIOTOMY Left 10/25/2019   Procedure: CRANIOTOMY HEMATOMA EVACUATION SUBDURAL;  Surgeon: Bedelia Person, MD;  Location: [facility] OR;  Service: Neurosurgery;  Laterality: Left;   ESOPHAGOGASTRODUODENOSCOPY     around 2018 with [facility]   FOOT CAPSULOTOMY Left 07/25/2008   Mid Foot #2 MPJ   Hammertoe Repair Left 07/25/2008   #2 toe   HAND SURGERY     nodule removed from left thumb and right index finger, Dr. Judithann Sheen   INTRAOPERATIVE TRANSTHORACIC ECHOCARDIOGRAM N/A 01/26/2022   Procedure: INTRAOPERATIVE TRANSTHORACIC ECHOCARDIOGRAM;  Surgeon: Orbie Pyo, MD;  Location: [facility] OR;  Service: Open Heart Surgery;  Laterality: N/A;   MOHS SURGERY  01/25/2023   right arm   RIGHT/LEFT HEART CATH AND CORONARY ANGIOGRAPHY N/A 12/11/2021   Procedure: RIGHT/LEFT HEART CATH AND CORONARY ANGIOGRAPHY;  Surgeon: Corky Crafts, MD;  Location: [facility] INVASIVE CV LAB;  Service: Cardiovascular;  Laterality: N/A;   SPINAL FUSION  01/2021   TARSAL TUNNEL RELEASE Left 07/25/2008   TONSILLECTOMY     removed as a child   TRANSCATHETER AORTIC VALVE REPLACEMENT, TRANSFEMORAL N/A 01/26/2022   Procedure: Transcatheter Aortic Valve Replacement , Transfemoral;  Surgeon: Orbie Pyo, MD;  Location: [facility] OR;  Service: Open Heart Surgery;  Laterality: N/A;  Percutaneous   UPPER GASTROINTESTINAL ENDOSCOPY  09/02/2020    Medications  Current Outpatient Medications on File Prior to Visit  Medication Sig Dispense Refill   acetaminophen (TYLENOL) 325 MG tablet Take 650 mg by mouth every 6 (six) hours as needed for mild pain or headache.     amLODipine (NORVASC) 5 MG tablet TAKE 1 TABLET DAILY 90 tablet 3   amoxicillin (AMOXIL) 500 MG tablet Take 4 tablets (2,000 mg total) by mouth as directed. 1 HOUR PRIOR TO DENTAL APPOINTMENTS 12 tablet 6   aspirin EC 81 MG tablet Take 1 tablet (81 mg total) by mouth daily. Swallow whole. 90 tablet 3   B Complex Vitamins (B COMPLEX PO) Take 1 tablet by mouth  daily.     Cholecalciferol (VITAMIN D3 SUPER STRENGTH) 50 MCG (2000 UT) CAPS Take 2,000 Units by mouth daily.     escitalopram (LEXAPRO) 10 MG tablet TAKE 1 TABLET DAILY 90 tablet 2   Etanercept (ENBREL MINI) 50 MG/ML SOCT Inject 50 mg into the skin once a week. 12 mL 0   ezetimibe (ZETIA) 10 MG tablet TAKE 1 TABLET DAILY FOR    ELEVATED CHOLESTEROL AND   TRIGLYCERIDES 90 tablet 3   fenofibrate micronized (LOFIBRA) 200 MG capsule TAKE 1 CAPSULE DAILY BEFOREBREAKFAST 90 capsule 3   finasteride (PROSCAR) 5 MG tablet Take 1 tablet (5 mg total) by mouth daily. 90 tablet 3   fluticasone (FLONASE) 50 MCG/ACT nasal spray Place 2 sprays into both nostrils daily. 16 g 6   furosemide (LASIX) 80 MG tablet TAKE 1 TABLET BY MOUTH EVERY DAY (Patient taking differently: Take 80 mg by mouth daily as needed for edema.) 90 tablet 1   gabapentin (NEURONTIN) 100 MG capsule TAKE 2 CAPSULES AT BEDTIME 180 capsule 0   HYDROcodone-acetaminophen (NORCO) 7.5-325 MG tablet Take 1 tablet by mouth every 6 (six) hours as needed for moderate pain. 30 tablet 0   HYDROcodone-acetaminophen (NORCO) 7.5-325 MG tablet Take 1 tablet by mouth every 6 (six) hours as needed for moderate pain. 30 tablet 0   HYDROcodone-acetaminophen (NORCO) 7.5-325 MG tablet Take 1 tablet by mouth every 6 (six) hours as needed for moderate pain. 30 tablet 0   insulin aspart (NOVOLOG FLEXPEN) 100 UNIT/ML FlexPen Inject 0-10 Units into the skin See admin instructions. Inject 0-10 units into the skin before meals, PER SLIDING SCALE: BGL 0-200 = give nothing, 201-250 = 2 units, 251-300 = 4 units, 301-350 = 6 units, 351-400 = 8 units, >400 = 10 units, push fluids, and repeat BGL check in 2 hours. If BGL remains >400 after 2 hours, CALL MD.     insulin degludec (TRESIBA FLEXTOUCH) 100 UNIT/ML FlexTouch Pen Inject 30 Units into the skin daily.     leflunomide (ARAVA) 10 MG tablet Take 1 tablet (10 mg total) by mouth daily. 90 tablet 0   lisinopril (ZESTRIL) 40 MG  tablet TAKE 1 TABLET IN THE       MORNING 90 tablet 3   magnesium oxide (MAG-OX) 400 MG tablet Take 400 mg by mouth daily with lunch.     melatonin 5 MG TABS Take 5 mg by mouth at bedtime.     Multiple Vitamins-Minerals (MULTIVITAMIN WITH MINERALS) tablet Take 1 tablet by mouth daily with breakfast.     Omega 3 1000 MG CAPS Take 1,000 mg by mouth daily with lunch.     omeprazole (PRILOSEC) 20 MG capsule Take 1 capsule (20 mg total) by mouth daily. 90 capsule 3   ONETOUCH VERIO test strip 1 each by Other route as needed for  other (Glucose).     Potassium 99 MG TABS Take 99 mg by mouth every evening.     tamsulosin (FLOMAX) 0.4 MG CAPS capsule Take 1 capsule (0.4 mg total) by mouth daily. 30 min following evening meal 90 capsule 3   vitamin B-12 (CYANOCOBALAMIN) 1000 MCG tablet Take 1,000 mcg by mouth daily.     vitamin E 180 MG (400 UNITS) capsule Take 400 Units by mouth daily.     Allergies Allergies  Allergen Reactions   Canagliflozin Other (See Comments)    Pt reports that the use of Invokana caused acute KIDNEY FAILURE ([facility] records) and "allergic," per [facility]   Aspirin Other (See Comments)    Gastroesophageal reflux and "allergic," per [facility]    Atorvastatin Other (See Comments)    Myalgias, GI upset, and "allergic," per [facility]   Statins Other (See Comments)    Muscle pain- "allergic," per [facility]    Family History Family History  Problem Relation Age of Onset   Hyperlipidemia Mother    Colon polyps Mother    Hyperlipidemia Father    Alcoholism Brother    Healthy Son    Healthy Son    Cancer Neg Hx    Colon cancer Neg Hx    Esophageal cancer Neg Hx    Rectal cancer Neg Hx    Stomach cancer Neg Hx     Review of Systems: Constitutional:  no fevers Eye:  no recent significant change in vision Ears:  No changes in hearing Nose/Mouth/Throat:  no complaints of nasal congestion, no sore throat Cardiovascular: no chest pain Respiratory:  No shortness of breath, +  cough Gastrointestinal:  No change in bowel habits GU:  No frequency Integumentary: As noted in HPI Neurologic:  no headaches Endocrine:  denies unexplained weight changes  Exam BP 124/80 (BP Location: Left Arm, Patient Position: Sitting, Cuff Size: Normal)   Pulse 96   Temp (!) 97.5 F (36.4 C) (Oral)   Ht 5\' 11"  (1.803 m)   Wt 224 lb 2 oz (101.7 kg)   SpO2 96%   BMI 31.26 kg/m  General:  well developed, well nourished, in no apparent distress Skin: Over his proximal/lateral right leg, there is a slightly raised area of erythema with overlying scale.  There is no TTP, excessive warmth, fluctuance, drainage, or streaking redness.  Otherwise, no significant moles, warts, or growths Head:  no masses, lesions, or tenderness Eyes:  pupils equal and round, sclera anicteric without injection Ears:  canals without lesions, TMs shiny without retraction, no obvious effusion, no erythema Nose:  nares patent, mucosa normal Throat/Pharynx:  lips and gingiva without lesion; tongue and uvula midline; non-inflamed pharynx; no exudates or postnasal drainage Lungs:  clear to auscultation, breath sounds equal bilaterally, no respiratory distress Cardio:  regular rate and rhythm, no LE edema or bruits Rectal: Deferred GI: BS+, S, NT, ND, no masses or organomegaly Musculoskeletal:  symmetrical muscle groups noted without atrophy or deformity Neuro:  gait cautious and slow with a walker; deep tendon reflexes normal and symmetric; 4/5 strength of the lower extremities Psych: well oriented with normal range of affect and appropriate judgment/insight  Procedure note; punch biopsy  Informed consent was obtained. The area was cleaned with alcohol and then injected with 2.5 mL of 1% lidocaine with epinephrine. The area was then cleaned with Hibiclens. Vertical traction was placed along the skin and a 2 mm punch biopsy was used. The specimen was grasped with forceps and separated with iris scissors. The  specimen was placed in a sterile specimen cup and sent to the lab. No sutures placed. There were no complications noted. The patient tolerated the procedure well.  Assessment and Plan  Well adult exam  Statin myopathy  Type 2 diabetes mellitus with diabetic neuropathy, with long-term current use of insulin (HCC) - Plan: CBC, Comprehensive metabolic panel, Lipid panel, Microalbumin / creatinine urine ratio, CANCELED: Hemoglobin A1c  Encounter for long-term (current) use of high-risk medication - Plan: Drug Monitoring Panel [phone] , Urine  Chronic cough  Neoplasm of uncertain behavior - Plan: Surgical pathology( [facility]/ [facility])  Well 77 y.o. male. Counseled on diet and exercise. Advanced directive form requested today.  CSC and UDS updated today.  Chronic cough: Possible adverse effects of medication.  Will stop his lisinopril.  He will monitor blood pressure at home as it has been coming down.  If it starts to spike up again, he will let me know and I will send in olmesartan 20 mg daily.  If it does not resolve in the next 4 to 5 weeks, he will let me know and we will see him in the clinic again to discuss the next options.  He is not having any other symptoms. Skin lesion: Punch biopsy today.  Depending on the results, may have to treat in the office or refer to the dermatology team. CSC and UDS updated today.  Other orders as above. Follow up in 6 mo.  The patient voiced understanding and agreement to the plan.  Jilda Roche [place], DO 06/15/23 12:42 PM

## 2023-06-17 ENCOUNTER — Other Ambulatory Visit: Payer: Self-pay | Admitting: Family Medicine

## 2023-06-18 LAB — DRUG MONITORING PANEL 376104, URINE
Amphetamines: NEGATIVE ng/mL (ref <500)
Barbiturates: NEGATIVE ng/mL (ref <300)
Benzodiazepines: NEGATIVE ng/mL (ref <100)
Cocaine Metabolite: NEGATIVE ng/mL (ref <150)
Codeine: NEGATIVE ng/mL (ref <50)
Desmethyltramadol: NEGATIVE ng/mL (ref <100)
Hydrocodone: 128 ng/mL — ABNORMAL HIGH (ref <50)
Hydromorphone: 100 ng/mL — ABNORMAL HIGH (ref <50)
Morphine: NEGATIVE ng/mL (ref <50)
Norhydrocodone: 188 ng/mL — ABNORMAL HIGH (ref <50)
Opiates: POSITIVE ng/mL — AB (ref <100)
Oxycodone: NEGATIVE ng/mL (ref <100)
Tramadol: NEGATIVE ng/mL (ref <100)

## 2023-06-18 LAB — DM TEMPLATE

## 2023-06-28 ENCOUNTER — Telehealth: Payer: Self-pay | Admitting: Pharmacist

## 2023-06-28 ENCOUNTER — Other Ambulatory Visit: Payer: Medicare HMO | Admitting: Pharmacist

## 2023-06-28 NOTE — Progress Notes (Signed)
Pharmacy Note  06/28/2023 Name: Tommy Markiewicz Green MRN: [id number] DOB: 19-Jan-1946  Subjective: Tommy Green is a 77 y.o. year old male who is a primary care patient of [name], DO. Clinical Pharmacist Practitioner referral was placed to assist with medication management, hyperlipidemia, CKD and medication access.     Engaged with patient's wife by telephone for  assessment of medication access / assistance  today.   DM, insulin dependent with CKD 4:  Patient continues to follow up with [name] at [facility]. His last appointment was 05/24/2023 A1c was 6.7% 05/23/2023  Current treatment: Tresiba 30 units daily and Novolog per sliding scale.  He checks blood glucose 3 to 4 times per day by fingerstick method. Uses Relion glucometer.  Discussed continuous Glucose Monitor in past with patient which I think would be a good option for him but he has declined.   Creatinine has worsened over the last 6 months. Dr [name] had recommended starting Kerendia in Feb 2024 but patient declined due to cost.  He was sent application for medication assistance program but he declined to complete. Discussed with patient's wife today. She states she does not feel that financially they would add another $47 / month copay to their current expenses.  She also didn't want to add more medications since he is taking 2 medications that she thought were for his kidneys - finasteride and tamsulosin.   Unfortunately he has tried SGLT2 in past and had acute kidney failure that was thought to be related to start of SGLT2.    Lab Results  Component Value Date   CREATININE 2.13 (H) 06/15/2023   CREATININE 2.00 (H) 03/17/2023   CREATININE 1.70 (H) 12/16/2022      Hypertension:  Current therapy: no current medication. Patient stopped both lisinopril and amlodipine prior to appointment with Dr [name] because patient and his wife felt that blood pressure meds were causing dry mouth.  Blood pressure was still at goal.  Not recent blood pressure readings from home available.  Denies dizziness, chest pain or headache.    BP Readings from Last 3 Encounters:  06/15/23 124/80  06/01/23 131/64  02/07/23 (!) 150/78    Hyperlipidemia:  Current therapy: ezetimibe 10mg  daily, fenofibrate 200mg  daily and omega 3 fatty acids 1000mg  daily (has not been able to tolerate higher doses of omega 3 FAs due to reflux and belching)   Checked [employer] coverage of prescription Omega 3 agents both generic Lovaza and Vascepa / icosopent ethyl)  but cost is $100 / 30 days. Past therapy: pravastatin, atorvastatin and lovastatin in past but stopped due to statin induced myalgias. Patient declines retrial of statin therapy.  Would like to try PCSK9 due to elevated triglycerides and LDL. Cost would be $47 / month for Repatha until patient reaches coverage gap.    He is currently receiving [name], [name] and Novolog thru medication assistance program.   Objective: Review of patient status, including review of consultants reports, laboratory and other test data, was performed as part of comprehensive.  Lab Results  Component Value Date   CREATININE 2.13 (H) 06/15/2023   CREATININE 2.00 (H) 03/17/2023   CREATININE 1.70 (H) 12/16/2022    Lab Results  Component Value Date   HGBA1C 6.7 05/23/2023       Component Value Date/Time   CHOL 161 06/15/2023 1100   CHOL 173 06/29/2022 1456   TRIG (H) 06/15/2023 1100    611.0 Triglyceride is over 400; calculations on Lipids are  invalid.   HDL 18.80 (L) 06/15/2023 1100   HDL 24 (L) 06/29/2022 1456   CHOLHDL 9 06/15/2023 1100   VLDL 63.0 (H) 05/04/2021 0852   LDLCALC 78 06/29/2022 1456   LDLDIRECT 84.0 06/15/2023 1100     Clinical ASCVD: Yes  The ASCVD Risk score (Arnett DK, et al., 2019) failed to calculate for the following reasons:   The patient has a prior MI or stroke diagnosis    BP Readings from Last 3 Encounters:  06/15/23 124/80   06/01/23 131/64  02/07/23 (!) 150/78     Allergies  Allergen Reactions   Canagliflozin Other (See Comments)    Pt reports that the use of Invokana caused acute KIDNEY FAILURE ([facility] records) and "allergic," per [facility]   Aspirin Other (See Comments)    Gastroesophageal reflux and "allergic," per [facility]    Atorvastatin Other (See Comments)    Myalgias, GI upset, and "allergic," per [facility]   Statins Other (See Comments)    Muscle pain- "allergic," per [facility]    Medications Reviewed Today     Reviewed by [name], RPH-CPP (Pharmacist) on 06/28/23 at 1650  Med List Status: <None>   Medication Order Taking? Sig Documenting Provider Last Dose Status Informant  acetaminophen (TYLENOL) 325 MG tablet [id number]  Take 650 mg by mouth every 6 (six) hours as needed for mild pain or headache. [provider]  Active Self  amoxicillin (AMOXIL) 500 MG tablet [id number] Yes Take 4 tablets (2,000 mg total) by mouth as directed. 1 HOUR PRIOR TO DENTAL APPOINTMENTS Baldo Daub, MD Taking Active   aspirin EC 81 MG tablet [id number] Yes Take 1 tablet (81 mg total) by mouth daily. Swallow whole. Georgie Chard D, NP Taking Active   B Complex Vitamins (B COMPLEX PO) [id number] Yes Take 1 tablet by mouth daily. [provider] Taking Active Self  Cholecalciferol (VITAMIN D3 SUPER STRENGTH) 50 MCG (2000 UT) CAPS [id number] Yes Take 2,000 Units by mouth daily. [provider] Taking Active Self  escitalopram (LEXAPRO) 10 MG tablet [id number] Yes TAKE 1 TABLET DAILY [name], DO Taking Active   Etanercept (ENBREL MINI) 50 MG/ML SOCT [id number] Yes Inject 50 mg into the skin once a week. Gearldine Bienenstock, PA-C Taking Active            Med Note Clydie Braun, [facility] B   Tue Jun 28, 2023  4:45 PM) Gets from assistance program  ezetimibe (ZETIA) 10 MG tablet [id number] Yes TAKE 1 TABLET DAILY FOR    ELEVATED CHOLESTEROL AND   TRIGLYCERIDES [name], DO Taking Active    fenofibrate micronized (LOFIBRA) 200 MG capsule [id number] Yes TAKE 1 CAPSULE DAILY BEFOREBREAKFAST [name], DO Taking Active   finasteride (PROSCAR) 5 MG tablet [id number] Yes Take 1 tablet (5 mg total) by mouth daily. Joline Maxcy, MD Taking Active   fluticasone [facility]) 50 MCG/ACT nasal spray [id number]  Place 2 sprays into both nostrils daily. [name], DO  Active            Med Note Clydie Braun, [facility] B   Wed Sep 22, 2022  9:33 AM) Using as needed  furosemide (LASIX) 80 MG tablet [id number] No TAKE 1 TABLET BY MOUTH EVERY DAY  Patient not taking: Reported on 06/28/2023   [name], DO Not Taking Active            Med Note [facility], [facility] B   Tue Jun 28, 2023  4:48 PM) Only takes  as needed  gabapentin (NEURONTIN) 100 MG capsule [id number] Yes TAKE 2 CAPSULES AT BEDTIME [name], DO Taking Active   HYDROcodone-acetaminophen (NORCO) 7.5-325 MG tablet [id number] Yes Take 1 tablet by mouth every 6 (six) hours as needed for moderate pain. [name], DO Taking Active   HYDROcodone-acetaminophen [facility]) 7.5-325 MG tablet [id number] Yes Take 1 tablet by mouth every 6 (six) hours as needed for moderate pain. [name], DO Taking Active   HYDROcodone-acetaminophen [facility]) 7.5-325 MG tablet [id number] Yes Take 1 tablet by mouth every 6 (six) hours as needed for moderate pain. [name], DO Taking Active   insulin aspart (NOVOLOG FLEXPEN) 100 UNIT/ML FlexPen [id number] Yes Inject 0-10 Units into the skin See admin instructions. Inject 0-10 units into the skin before meals, PER SLIDING SCALE: BGL 0-200 = give nothing, 201-250 = 2 units, 251-300 = 4 units, 301-350 = 6 units, 351-400 = 8 units, >400 = 10 units, push fluids, and repeat BGL check in 2 hours. If BGL remains >400 after 2 hours, CALL MD. [provider] Taking Active Self  insulin degludec (TRESIBA FLEXTOUCH) 100 UNIT/ML FlexTouch Pen [id number] Yes  Inject 30 Units into the skin daily. [provider] Taking Active   leflunomide (ARAVA) 10 MG tablet [id number] Yes Take 1 tablet (10 mg total) by mouth daily. Gearldine Bienenstock, PA-C Taking Active   magnesium oxide (MAG-OX) 400 MG tablet [id number] Yes Take 400 mg by mouth daily with lunch. [provider] Taking Active Self  melatonin 5 MG TABS [id number] Yes Take 5 mg by mouth at bedtime. [provider] Taking Active Self  Multiple Vitamins-Minerals (MULTIVITAMIN WITH MINERALS) tablet [id number] Yes Take 1 tablet by mouth daily with breakfast. [provider] Taking Active Self  Omega 3 1000 MG CAPS [id number] Yes Take 1,000 mg by mouth daily with lunch. [provider] Taking Active Self  omeprazole (PRILOSEC) 20 MG capsule [id number] Yes Take 1 capsule (20 mg total) by mouth daily. [name], DO Taking Active   [facility] VERIO test strip [id number]  1 each by Other route as needed for other (Glucose). [provider]  Active Self           Med Note Clydie Braun, Cindie Laroche Jan 06, 2023  9:32 AM) Per patient he has Relion meter  Potassium 99 MG TABS [id number] Yes Take 99 mg by mouth every evening. [provider] Taking Active Self  tamsulosin (FLOMAX) 0.4 MG CAPS capsule [id number] Yes Take 1 capsule (0.4 mg total) by mouth daily. 30 min following evening meal Joline Maxcy, MD Taking Active   vitamin B-12 (CYANOCOBALAMIN) 1000 MCG tablet [id number] Yes Take 1,000 mcg by mouth daily. [provider] Taking Active Self  vitamin E 180 MG (400 UNITS) capsule [id number] Yes Take 400 Units by mouth daily. [provider] Taking Active Self            Patient Active Problem List   Diagnosis Date Noted   Statin myopathy 06/15/2023   IDA (iron deficiency anemia) 07/06/2022   Current mild episode of major depressive disorder without prior episode (HCC) 05/17/2022   Other chronic pain 03/05/2022   Bilateral lower  extremity edema 03/05/2022   Severe aortic stenosis 01/26/2022   S/P TAVR (transcatheter aortic valve replacement) 01/26/2022   Microcytic anemia 12/10/2021   Obesity (BMI 30-39.9) 12/10/2021   Near syncope 12/09/2021   Aortic stenosis 12/09/2021   Rheumatoid arthritis (HCC) 12/09/2021   Foot  drop, left foot 10/02/2021   History of colonic polyps 10/02/2021   History of rheumatic fever 10/02/2021   Stress fracture of left foot 09/03/2021   Gabapentin overdose, accidental or unintentional, initial encounter 03/08/2021   TIA (transient ischemic attack) 03/05/2021   Body mass index (BMI) 36.0-36.9, adult 10/03/2020   Subdural hemorrhage following injury without open intracranial wound and with prolonged loss of consciousness (more than 24 hours) without return to pre-existing conscious level (HCC) 10/03/2020   AKI (acute kidney injury) (HCC)    Acute encephalopathy    SDH (subdural hematoma) (HCC) 10/24/2019   Cyclic vomiting syndrome 08/06/2019   Neuropathy 12/25/2018   AK (actinic keratosis) 08/24/2018   Neoplasm of uncertain behavior 07/27/2018   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   Tendinopathy of right gluteus medius 07/13/2018   Tendinopathy of left gluteus medius 07/13/2018   Squamous cell carcinoma in situ (SCCIS) of skin 03/24/2018   Vitamin D deficiency 11/29/2017   Stage 3 chronic kidney disease (HCC) 10/28/2017   Primary osteoarthritis of right hip 09/11/2017   Essential hypertension 07/18/2017   Hyperlipidemia associated with type 2 diabetes mellitus (HCC) 07/18/2017   Type 2 diabetes mellitus with diabetic neuropathy, unspecified (HCC) 07/18/2017   Heart murmur 07/18/2017   Hypertriglyceridemia 07/18/2017   History of stroke 07/18/2017   DDD (degenerative disc disease), lumbar 06/15/2017   Iron deficiency anemia 06/15/2017   Stroke (HCC) 06/15/2017   Encounter for hepatitis C screening test for low risk patient 06/30/2016   Anemia 06/19/2016   Microalbuminuria due to  type 2 diabetes mellitus (HCC) 03/19/2016   Hypertrophy of inferior nasal turbinate 12/20/2015   Disease of nasal cavity and sinuses 12/10/2015   Degenerative tear of acetabular labrum of left hip 11/04/2015   History of tobacco abuse 05/15/2015   Morbid (severe) obesity due to excess calories (HCC) 04/11/2015   Disorder of both eustachian tubes 01/27/2015   Maxillary sinusitis 01/13/2015   Lipoprotein deficiency 01/31/2014   Gastro-esophageal reflux disease without esophagitis 11/02/2013   Rheumatic fever 11/02/2013   Erectile dysfunction associated with type 2 diabetes mellitus (HCC) 07/31/2013   Type 2 diabetes mellitus with hyperglycemia, without long-term current use of insulin (HCC) 11/16/2012     Medication Assistance:   Patient gets [place] and [employer] thru [employer] medication assistance program - application handled by his endocrinologist office - [name].    Assessment / Plan: DM, insulin dependent: Last A1c on file was at goal  Continue current therapy and follow up with [name].  Eye exam is UTD - last 01/06/2023  Hypertension: Office blood pressure and home blood pressure have been at goal of < 130/80  Continue to check blood pressure daily and record  Hyperlipidemia: With history of TIA and DM - goal LDL < 55; Tg < 150 - not at goal. Noted to be statin intolerant due to myalgias. Continue ezetimibe and fenofibrate. Would like to try Repatha. Patient might qualify for medication assistance program. Will send paperwork but he might not be eligible until he reaches coverage gap.  Usually [employer] will help with copays and cost but currently hypercholesterolemia fund is closed to new enrollment.  Checked cost of Lovaze or icosopent ethyl on [employer] - cost is $100 per month - preferred by tier 4.  Reviewed labs and diet recommendations to lower triglycerides.  Recommend try to increase omega 3 FA over-the-counter 2000mg  daily   CKD 4 - renal function has  declined Discussed completing medication assistance program application for [place]. Patient's wife states  Mr. Mashaw does not want to take any more medications.   Medication management:  Reviewed and updated medication list Reviewed refill history and adherence   Follow Up:  Telephone follow up appointment with care management team member scheduled for:   1 to 2 months.   [name], PharmD Clinical Pharmacist [facility]  - [facility] [phone]

## 2023-06-28 NOTE — Telephone Encounter (Signed)
-----   Message from Jilda Roche Wendling sent at 06/16/2023 12:46 PM EDT ----- Regarding: RE: lipids and CKD 3b GFR is still low, could we see about the finerenone please? TG's and LDL still high, would insurance cover the injectable? ----- Message ----- From: [name], RPH-CPP Sent: 06/13/2023  12:00 AM EDT To: [name], DO Subject: lipids and CKD 3b                              Dr [name],  You will see Mr Charney Wed, Aug 28th for a CPE.    He has has type 2 DM - please code for statin exclusion since he has experienced myalgias with multiple statins in the past. (pravastatin, atorvastatin and lovastatin)  Myopathy ICD-10-CM: G72.0, G72.2, G72.9  Myalgia ICD-10-CM: M79.10, M79.11, M79.12, M79.18   Depending on lab results consider the following:   1- LDL goal < 55 due to history of TIA and diabetes; since he is statin intolerant could consider PCSK9 if LDL still > 55.    2 - CKD3b - you prescribed Kerendia in Feb 2024 but he did not fill due to cost. I mailed him application for [place] medication assistance program but he has declined to complete. I can try again if eGFR still low.   [name], PharmD Clinical Pharmacist

## 2023-06-28 NOTE — Telephone Encounter (Signed)
Reviewed both [name] and Repatha patient assistance programs. Patient should qualify. Called [employer] for [place] and verified that they do allow Medicare patient's to apply for their medication assistance program and they do.   Patient's last LDL was 84 ad Tg 611. Goals are LDL < 70 due to DM and history of stroke (if could get to < 55 even better) and Tg goal is < 150.  Regarding SUPD - patient is intolerant to statin therapy due to myopathy / myalgias. Dr [name] coded at appointment July 06, 2023.  Last Scr was 2.13 and eGFR 28.91; serum potassium was 4.7  Tried to contact patient to discuss starting above medications and possibly applying for patient assistance if he is in Medicare coverage gap.   Unable to reach patient - LM on VM with CB # [phone] or [phone]  Also updated A1c - abstracted last A1c from Dr Oneita Kras office A1c was 6.7% 05/23/2023  .tbes

## 2023-06-29 ENCOUNTER — Ambulatory Visit: Payer: Medicare HMO

## 2023-06-29 ENCOUNTER — Ambulatory Visit (INDEPENDENT_AMBULATORY_CARE_PROVIDER_SITE_OTHER): Payer: Medicare HMO

## 2023-06-29 ENCOUNTER — Ambulatory Visit: Payer: Medicare HMO | Attending: Physician Assistant | Admitting: Physician Assistant

## 2023-06-29 ENCOUNTER — Encounter: Payer: Self-pay | Admitting: Physician Assistant

## 2023-06-29 VITALS — BP 131/72 | HR 88 | Ht 71.0 in | Wt 220.6 lb

## 2023-06-29 DIAGNOSIS — M1712 Unilateral primary osteoarthritis, left knee: Secondary | ICD-10-CM | POA: Diagnosis not present

## 2023-06-29 DIAGNOSIS — R011 Cardiac murmur, unspecified: Secondary | ICD-10-CM

## 2023-06-29 DIAGNOSIS — M79642 Pain in left hand: Secondary | ICD-10-CM

## 2023-06-29 DIAGNOSIS — M0579 Rheumatoid arthritis with rheumatoid factor of multiple sites without organ or systems involvement: Secondary | ICD-10-CM

## 2023-06-29 DIAGNOSIS — E1169 Type 2 diabetes mellitus with other specified complication: Secondary | ICD-10-CM

## 2023-06-29 DIAGNOSIS — M160 Bilateral primary osteoarthritis of hip: Secondary | ICD-10-CM

## 2023-06-29 DIAGNOSIS — M5136 Other intervertebral disc degeneration, lumbar region: Secondary | ICD-10-CM

## 2023-06-29 DIAGNOSIS — M533 Sacrococcygeal disorders, not elsewhere classified: Secondary | ICD-10-CM | POA: Diagnosis not present

## 2023-06-29 DIAGNOSIS — S065XAA Traumatic subdural hemorrhage with loss of consciousness status unknown, initial encounter: Secondary | ICD-10-CM

## 2023-06-29 DIAGNOSIS — M25572 Pain in left ankle and joints of left foot: Secondary | ICD-10-CM

## 2023-06-29 DIAGNOSIS — M79672 Pain in left foot: Secondary | ICD-10-CM

## 2023-06-29 DIAGNOSIS — G8929 Other chronic pain: Secondary | ICD-10-CM

## 2023-06-29 DIAGNOSIS — M79671 Pain in right foot: Secondary | ICD-10-CM | POA: Diagnosis not present

## 2023-06-29 DIAGNOSIS — D049 Carcinoma in situ of skin, unspecified: Secondary | ICD-10-CM

## 2023-06-29 DIAGNOSIS — M0630 Rheumatoid nodule, unspecified site: Secondary | ICD-10-CM

## 2023-06-29 DIAGNOSIS — M24521 Contracture, right elbow: Secondary | ICD-10-CM | POA: Diagnosis not present

## 2023-06-29 DIAGNOSIS — Z79899 Other long term (current) drug therapy: Secondary | ICD-10-CM | POA: Diagnosis not present

## 2023-06-29 DIAGNOSIS — I10 Essential (primary) hypertension: Secondary | ICD-10-CM

## 2023-06-29 DIAGNOSIS — Z952 Presence of prosthetic heart valve: Secondary | ICD-10-CM | POA: Diagnosis not present

## 2023-06-29 DIAGNOSIS — M51369 Other intervertebral disc degeneration, lumbar region without mention of lumbar back pain or lower extremity pain: Secondary | ICD-10-CM

## 2023-06-29 DIAGNOSIS — M79641 Pain in right hand: Secondary | ICD-10-CM

## 2023-06-29 DIAGNOSIS — Z794 Long term (current) use of insulin: Secondary | ICD-10-CM

## 2023-06-29 DIAGNOSIS — L920 Granuloma annulare: Secondary | ICD-10-CM

## 2023-06-29 DIAGNOSIS — Z8673 Personal history of transient ischemic attack (TIA), and cerebral infarction without residual deficits: Secondary | ICD-10-CM

## 2023-06-29 DIAGNOSIS — E1140 Type 2 diabetes mellitus with diabetic neuropathy, unspecified: Secondary | ICD-10-CM

## 2023-06-29 DIAGNOSIS — Z111 Encounter for screening for respiratory tuberculosis: Secondary | ICD-10-CM

## 2023-06-29 DIAGNOSIS — E785 Hyperlipidemia, unspecified: Secondary | ICD-10-CM

## 2023-06-29 NOTE — Progress Notes (Signed)
X-rays of the right hand revealed increased narrowing of the 2nd MCP and possibly intercarpal and radiocarpal bones.   Findings are consistent with RA and OA overlap.

## 2023-06-29 NOTE — Patient Instructions (Signed)
Standing Labs We placed an order today for your standing lab work.   Please have your standing labs drawn at end of November and every 3 months   Please have your labs drawn 2 weeks prior to your appointment so that the provider can discuss your lab results at your appointment, if possible.  Please note that you may see your imaging and lab results in MyChart before we have reviewed them. We will contact you once all results are reviewed. Please allow our office up to 72 hours to thoroughly review all of the results before contacting the office for clarification of your results.  WALK-IN LAB HOURS  Monday through Thursday from 8:00 am -12:30 pm and 1:00 pm-5:00 pm and Friday from 8:00 am-12:00 pm.  Patients with office visits requiring labs will be seen before walk-in labs.  You may encounter longer than normal wait times. Please allow additional time. Wait times may be shorter on  Monday and Thursday afternoons.  We do not book appointments for walk-in labs. We appreciate your patience and understanding with our staff.   Labs are drawn by [facility]. Please bring your co-pay at the time of your lab draw.  You may receive a bill from [facility] for your lab work.  Please note if you are on Hydroxychloroquine and and an order has been placed for a Hydroxychloroquine level,  you will need to have it drawn 4 hours or more after your last dose.  If you wish to have your labs drawn at another location, please call the office 24 hours in advance so we can fax the orders.  The office is located at [address], [address], [place], [place] [address]   If you have any questions regarding directions or hours of operation,  please call [phone].   As a reminder, please drink plenty of water prior to coming for your lab work. Thanks!

## 2023-06-29 NOTE — Progress Notes (Signed)
X-rays of both feet are consistent with inflammatory arthritis and osteoarthritis overlap.   X-rays of the left hand--No radiographic progression noted. RA and OA overlap.

## 2023-06-30 DIAGNOSIS — C441192 Basal cell carcinoma of skin of left lower eyelid, including canthus: Secondary | ICD-10-CM | POA: Diagnosis not present

## 2023-06-30 DIAGNOSIS — D485 Neoplasm of uncertain behavior of skin: Secondary | ICD-10-CM | POA: Diagnosis not present

## 2023-06-30 DIAGNOSIS — D034 Melanoma in situ of scalp and neck: Secondary | ICD-10-CM | POA: Diagnosis not present

## 2023-06-30 DIAGNOSIS — Z129 Encounter for screening for malignant neoplasm, site unspecified: Secondary | ICD-10-CM | POA: Diagnosis not present

## 2023-06-30 DIAGNOSIS — Z85820 Personal history of malignant melanoma of skin: Secondary | ICD-10-CM | POA: Diagnosis not present

## 2023-06-30 DIAGNOSIS — Z86008 Personal history of in-situ neoplasm of other site: Secondary | ICD-10-CM | POA: Diagnosis not present

## 2023-06-30 DIAGNOSIS — L821 Other seborrheic keratosis: Secondary | ICD-10-CM | POA: Diagnosis not present

## 2023-07-11 ENCOUNTER — Telehealth: Payer: Self-pay | Admitting: Family Medicine

## 2023-07-11 ENCOUNTER — Other Ambulatory Visit: Payer: Self-pay | Admitting: Family Medicine

## 2023-07-11 MED ORDER — HYDROCODONE-ACETAMINOPHEN 7.5-325 MG PO TABS: 1.0000 | ORAL_TABLET | Freq: Four times a day (QID) | ORAL | 0 refills | Status: DC | PRN

## 2023-07-11 NOTE — Telephone Encounter (Signed)
Prescription Request  07/11/2023  Is this a "Controlled Substance" medicine? Yes  LOV: 06/15/2023  What is the name of the medication or equipment?   HYDROcodone-acetaminophen (NORCO) 7.5-325 MG tablet [[id number]]  Have you contacted your pharmacy to request a refill? No   Which pharmacy would you like this sent to?   [id number] - [place], [place] - [address] [place] [address] Phone: [phone] Fax: [phone]  Patient notified that their request is being sent to the clinical staff for review and that they should receive a response within 2 business days.   Please advise at Mobile [phone] (mobile)

## 2023-07-11 NOTE — Telephone Encounter (Signed)
Last OV--06/15/2023 Last RF---05/06/23

## 2023-07-13 ENCOUNTER — Telehealth: Payer: Medicare HMO

## 2023-07-14 ENCOUNTER — Telehealth: Payer: Self-pay | Admitting: Pharmacist

## 2023-07-14 NOTE — Telephone Encounter (Signed)
error 

## 2023-07-25 ENCOUNTER — Telehealth: Payer: Self-pay | Admitting: Family Medicine

## 2023-07-25 NOTE — Telephone Encounter (Signed)
Tommy Green (spouse DPR Ok) called to cancel pt appt and stated it can be rescheduled any other time that week in the afternoon.

## 2023-07-26 NOTE — Telephone Encounter (Signed)
Spoke with Mrs. Tommy Green - rescheduled for 08/03/2023 at 3pm

## 2023-08-01 ENCOUNTER — Telehealth: Payer: Medicare HMO

## 2023-08-01 ENCOUNTER — Other Ambulatory Visit: Payer: Self-pay | Admitting: *Deleted

## 2023-08-01 MED ORDER — ENBREL MINI 50 MG/ML ~~LOC~~ SOCT: 50.0000 mg | SUBCUTANEOUS | 0 refills | Status: DC

## 2023-08-01 NOTE — Telephone Encounter (Signed)
Patient's wife Elease Hashimoto contacted the office and requested a refill on Enbrel to be sent to [facility].  Last Fill: 03/22/2023  Labs: 06/15/2023 Sodium 134, Glucose 150, BUN 36,Creat. 2.13, GFR 28.91, Alk. Phos 35, RBC 3.33, Hgb 9.2, Hct 28.4  TB Gold: 08/10/2022 Neg   Next Visit: 11/28/2022  Last Visit: 06/29/2023  DX: Rheumatoid arthritis involving multiple sites with positive rheumatoid factor   Current Dose per office note 06/29/2023: Enbrel mini 50 mg sq injections once weekly   Okay to refill Enbrel?

## 2023-08-01 NOTE — Telephone Encounter (Signed)
Left message to advise patient he is due to update his TB Gold this month.

## 2023-08-01 NOTE — Telephone Encounter (Signed)
Due to update TB gold this month.

## 2023-08-03 ENCOUNTER — Ambulatory Visit: Payer: Medicare HMO | Admitting: Pharmacist

## 2023-08-03 DIAGNOSIS — I10 Essential (primary) hypertension: Secondary | ICD-10-CM

## 2023-08-03 DIAGNOSIS — E1140 Type 2 diabetes mellitus with diabetic neuropathy, unspecified: Secondary | ICD-10-CM

## 2023-08-03 DIAGNOSIS — E1169 Type 2 diabetes mellitus with other specified complication: Secondary | ICD-10-CM

## 2023-08-03 DIAGNOSIS — G720 Drug-induced myopathy: Secondary | ICD-10-CM

## 2023-08-03 NOTE — Progress Notes (Signed)
Pharmacy Note  08/03/2023 Name: Tommy Green MRN: [id number] DOB: 1946-09-01  Subjective: Tommy Green is a 77 y.o. year old male who is a primary care patient of [name], DO. Clinical Pharmacist Practitioner referral was placed to assist with medication management and medication access.     Engaged with patient's wife by telephone for  assessment of medication access / assistance  today.   DM, insulin dependent with CKD 4:  Patient continues to follow up with [name] at [facility]. His last appointment was 05/24/2023 A1c was 6.7% 05/23/2023  Current treatment: Tresiba 30 units daily and Novolog per sliding scale.  Reports that he does not need Novolog with every meal.  He checks blood glucose 3 to 4 times per day by fingerstick method. Uses Relion glucometer.  Discussed continuous Glucose Monitor in past with patient which I think would be a good option for him but he has declined.   Creatinine has worsened over the last 6 months. Dr [name] had recommended starting Kerendia in Feb 2024 but patient declined due to cost.  He was sent application for medication assistance program but he declined to complete. Mrs. Burnadette Peter mentions today that she would like to apply for medication assistance program for Tommy Green and he would be willing to start if approved.    He has tried SGLT2 in past and had acute kidney failure that was thought to be related to start of SGLT2.    Lab Results  Component Value Date   CREATININE 2.13 (H) 06/15/2023   CREATININE 2.00 (H) 03/17/2023   CREATININE 1.70 (H) 12/16/2022      Hypertension:  Current therapy: no current medication.   Past medications: lisinopril and amlodipine - stopped due to suspected they were causing dry mouth.  Blood pressure today at home was:   Denies dizziness, chest pain or headache.    BP Readings from Last 3 Encounters:  06/29/23 131/72  06/15/23 124/80  06/01/23 131/64     Hyperlipidemia:  Current therapy: ezetimibe 10mg  daily, fenofibrate 200mg  daily and omega 3 fatty acids 1000mg  daily (has not been able to tolerate higher doses of omega 3 FAs due to reflux and belching)   Checked [employer] coverage of prescription Omega 3 agents both generic Lovaza and Vascepa / icosopent ethyl)  but cost is $100 / 30 days. Past therapy: pravastatin, atorvastatin and lovastatin in past but stopped due to statin induced myalgias. Patient declines retrial of statin therapy.  Would like to try PCSK9 due to elevated triglycerides and LDL. Cost would be $47 / month for Repatha until patient reaches coverage gap.    He is currently receiving [name], [name] and Novolog thru medication assistance program.   Objective: Review of patient status, including review of consultants reports, laboratory and other test data, was performed as part of comprehensive.  Lab Results  Component Value Date   CREATININE 2.13 (H) 06/15/2023   CREATININE 2.00 (H) 03/17/2023   CREATININE 1.70 (H) 12/16/2022    Lab Results  Component Value Date   HGBA1C 6.7 05/23/2023       Component Value Date/Time   CHOL 161 06/15/2023 1100   CHOL 173 06/29/2022 1456   TRIG (H) 06/15/2023 1100    611.0 Triglyceride is over 400; calculations on Lipids are invalid.   HDL 18.80 (L) 06/15/2023 1100   HDL 24 (L) 06/29/2022 1456   CHOLHDL 9 06/15/2023 1100   VLDL 63.0 (H) 05/04/2021 0852   LDLCALC 78 06/29/2022 1456  LDLDIRECT 84.0 06/15/2023 1100     Clinical ASCVD: Yes  The ASCVD Risk score (Arnett DK, et al., 2019) failed to calculate for the following reasons:   The patient has a prior MI or stroke diagnosis    BP Readings from Last 3 Encounters:  06/29/23 131/72  06/15/23 124/80  06/01/23 131/64     Allergies  Allergen Reactions   Canagliflozin Other (See Comments)    Pt reports that the use of Invokana caused acute KIDNEY FAILURE ([facility] records) and "allergic," per [facility]   Aspirin Other  (See Comments)    Gastroesophageal reflux and "allergic," per [facility]    Atorvastatin Other (See Comments)    Myalgias, GI upset, and "allergic," per [facility]   Statins Other (See Comments)    Muscle pain- "allergic," per [facility]    Medications Reviewed Today     Reviewed by [name], RPH-CPP (Pharmacist) on 08/03/23 at 1537  Med List Status: <None>   Medication Order Taking? Sig Documenting Provider Last Dose Status Informant  acetaminophen (TYLENOL) 325 MG tablet [id number] Yes Take 650 mg by mouth every 6 (six) hours as needed for mild pain or headache. [provider] Taking Active Self           Med Note Clydie Braun, Argelio Granier B   Wed Aug 03, 2023  3:20 PM) Reports he takes up to 4 per day  amoxicillin (AMOXIL) 500 MG tablet [id number]  Take 4 tablets (2,000 mg total) by mouth as directed. 1 HOUR PRIOR TO DENTAL APPOINTMENTS Baldo Daub, MD  Active   aspirin EC 81 MG tablet [id number] Yes Take 1 tablet (81 mg total) by mouth daily. Swallow whole. Georgie Chard D, NP Taking Active   B Complex Vitamins (B COMPLEX PO) [id number] Yes Take 1 tablet by mouth daily. [provider] Taking Active Self  Cholecalciferol (VITAMIN D3 SUPER STRENGTH) 50 MCG (2000 UT) CAPS [id number] Yes Take 2,000 Units by mouth daily. [provider] Taking Active Self  escitalopram (LEXAPRO) 10 MG tablet [id number] Yes TAKE 1 TABLET DAILY [name], DO Taking Active   etanercept (ENBREL MINI) 50 MG/ML injection [id number] Yes Inject 1 mL (50 mg total) into the skin once a week. Gearldine Bienenstock, PA-C Taking Active   ezetimibe (ZETIA) 10 MG tablet [id number] Yes TAKE 1 TABLET DAILY FOR    ELEVATED CHOLESTEROL AND   TRIGLYCERIDES [name], DO Taking Active   fenofibrate micronized (LOFIBRA) 200 MG capsule [id number] Yes TAKE 1 CAPSULE DAILY BEFOREBREAKFAST [name], DO Taking Active   finasteride (PROSCAR) 5 MG tablet [id number] Yes Take 1 tablet (5 mg total) by mouth  daily. Joline Maxcy, MD Taking Active   fluticasone [facility]) 50 MCG/ACT nasal spray [id number]  Place 2 sprays into both nostrils daily. [name], DO  Active            Med Note Clydie Braun, Patrici Minnis B   Wed Sep 22, 2022  9:33 AM) Using as needed  furosemide (LASIX) 80 MG tablet [id number]  TAKE 1 TABLET BY MOUTH EVERY DAY  Patient not taking: Reported on 06/28/2023   [name], DO  Active            Med Note Luiz Blare, Ike Bene Jun 29, 2023  2:19 PM)    gabapentin (NEURONTIN) 100 MG capsule [id number] Yes TAKE 2 CAPSULES AT BEDTIME [name], DO Taking Active   HYDROcodone-acetaminophen (NORCO) 7.5-325 MG tablet [id number] Yes Take 1 tablet  by mouth every 6 (six) hours as needed for moderate pain. [name], DO Taking Active            Med Note Clydie Braun, [place] B   Wed Aug 03, 2023  3:21 PM) Usually only takes 1 tablet at night  HYDROcodone-acetaminophen (NORCO) 7.5-325 MG tablet [id number]  Take 1 tablet by mouth every 6 (six) hours as needed for moderate pain. [name], DO  Active   HYDROcodone-acetaminophen (NORCO) 7.5-325 MG tablet [id number]  Take 1 tablet by mouth every 6 (six) hours as needed for moderate pain. [name], DO  Active   insulin aspart (NOVOLOG FLEXPEN) 100 UNIT/ML FlexPen [id number] Yes Inject 0-10 Units into the skin See admin instructions. Inject 0-10 units into the skin before meals, PER SLIDING SCALE: BGL 0-200 = give nothing, 201-250 = 2 units, 251-300 = 4 units, 301-350 = 6 units, 351-400 = 8 units, >400 = 10 units, push fluids, and repeat BGL check in 2 hours. If BGL remains >400 after 2 hours, CALL MD. [provider] Taking Active Self  insulin degludec (TRESIBA FLEXTOUCH) 100 UNIT/ML FlexTouch Pen [id number] Yes Inject 30 Units into the skin daily. [provider] Taking Active   leflunomide (ARAVA) 10 MG tablet [id number] Yes Take 1 tablet (10 mg total) by mouth daily.  Gearldine Bienenstock, PA-C Taking Active   magnesium oxide (MAG-OX) 400 MG tablet [id number] Yes Take 400 mg by mouth daily with lunch. [provider] Taking Active Self  melatonin 5 MG TABS [id number] Yes Take 5 mg by mouth at bedtime. [provider] Taking Active Self  Multiple Vitamins-Minerals (MULTIVITAMIN WITH MINERALS) tablet [id number] Yes Take 1 tablet by mouth daily with breakfast. [provider] Taking Active Self  Omega 3 1000 MG CAPS [id number] Yes Take 2,000 mg by mouth daily with lunch. [provider] Taking Active Self  omeprazole (PRILOSEC) 20 MG capsule [id number] Yes Take 1 capsule (20 mg total) by mouth daily. [name], DO Taking Active   [facility] VERIO test strip [id number]  1 each by Other route as needed for other (Glucose). [provider]  Active Self           Med Note Clydie Braun, Cindie Laroche Jan 06, 2023  9:32 AM) Per patient he has Relion meter  Potassium 99 MG TABS [id number]  Take 99 mg by mouth every evening. [provider]  Active Self  tamsulosin (FLOMAX) 0.4 MG CAPS capsule [id number] Yes Take 1 capsule (0.4 mg total) by mouth daily. 30 min following evening meal Joline Maxcy, MD Taking Active   vitamin B-12 (CYANOCOBALAMIN) 1000 MCG tablet [id number] Yes Take 1,000 mcg by mouth daily. [provider] Taking Active Self  vitamin E 180 MG (400 UNITS) capsule [id number] Yes Take 400 Units by mouth daily. [provider] Taking Active Self            Patient Active Problem List   Diagnosis Date Noted   Statin myopathy 06/15/2023   IDA (iron deficiency anemia) 07/06/2022   Current mild episode of major depressive disorder without prior episode (HCC) 05/17/2022   Other chronic pain 03/05/2022   Bilateral lower extremity edema 03/05/2022   Severe aortic stenosis 01/26/2022   S/P TAVR (transcatheter aortic valve replacement) 01/26/2022   Microcytic anemia 12/10/2021   Obesity (BMI  30-39.9) 12/10/2021   Near syncope 12/09/2021   Aortic stenosis 12/09/2021   Rheumatoid arthritis (HCC) 12/09/2021   Foot drop, left  foot 10/02/2021   History of colonic polyps 10/02/2021   History of rheumatic fever 10/02/2021   Stress fracture of left foot 09/03/2021   Gabapentin overdose, accidental or unintentional, initial encounter 03/08/2021   TIA (transient ischemic attack) 03/05/2021   Body mass index (BMI) 36.0-36.9, adult 10/03/2020   Subdural hemorrhage following injury without open intracranial wound and with prolonged loss of consciousness (more than 24 hours) without return to pre-existing conscious level (HCC) 10/03/2020   AKI (acute kidney injury) (HCC)    Acute encephalopathy    SDH (subdural hematoma) (HCC) 10/24/2019   Cyclic vomiting syndrome 08/06/2019   Neuropathy 12/25/2018   AK (actinic keratosis) 08/24/2018   Neoplasm of uncertain behavior 07/27/2018   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   Tendinopathy of right gluteus medius 07/13/2018   Tendinopathy of left gluteus medius 07/13/2018   Squamous cell carcinoma in situ (SCCIS) of skin 03/24/2018   Vitamin D deficiency 11/29/2017   Stage 3 chronic kidney disease (HCC) 10/28/2017   Primary osteoarthritis of right hip 09/11/2017   Essential hypertension 07/18/2017   Hyperlipidemia associated with type 2 diabetes mellitus (HCC) 07/18/2017   Type 2 diabetes mellitus with diabetic neuropathy, unspecified (HCC) 07/18/2017   Heart murmur 07/18/2017   Hypertriglyceridemia 07/18/2017   History of stroke 07/18/2017   DDD (degenerative disc disease), lumbar 06/15/2017   Iron deficiency anemia 06/15/2017   Stroke (HCC) 06/15/2017   Encounter for hepatitis C screening test for low risk patient 06/30/2016   Anemia 06/19/2016   Microalbuminuria due to type 2 diabetes mellitus (HCC) 03/19/2016   Hypertrophy of inferior nasal turbinate 12/20/2015   Disease of nasal cavity and sinuses 12/10/2015   Degenerative tear of  acetabular labrum of left hip 11/04/2015   History of tobacco abuse 05/15/2015   Morbid (severe) obesity due to excess calories (HCC) 04/11/2015   Disorder of both eustachian tubes 01/27/2015   Maxillary sinusitis 01/13/2015   Lipoprotein deficiency 01/31/2014   Gastro-esophageal reflux disease without esophagitis 11/02/2013   Rheumatic fever 11/02/2013   Erectile dysfunction associated with type 2 diabetes mellitus (HCC) 07/31/2013   Type 2 diabetes mellitus with hyperglycemia, without long-term current use of insulin (HCC) 11/16/2012     Medication Assistance:   Patient gets [place] and [employer] thru [employer] medication assistance program - application handled by his endocrinologist office - [name].    Assessment / Plan: DM, insulin dependent: Last A1c on file was at goal  Continue current therapy and follow up with [name].  Eye exam is UTD - last 01/06/2023 Sent app for [employer] medication assistance program - patient will take completed application to Dr Oneita Kras office.   Hypertension: Office blood pressure and home blood pressure have been at goal of < 130/80  Continue to check blood pressure daily and record  Hyperlipidemia: With history of TIA and DM - goal LDL < 55; Tg < 150 - not at goal. Noted to be statin intolerant due to myalgias. Continue ezetimibe and fenofibrate. Would like to try Repatha. Patient might qualify for medication assistance program but he and his wife have declined to start Repatha.  Checked cost of Lovaza or icosopent ethyl on [employer] - for 2024 cost is $100 per month - preferred but tier 4.  Reviewed labs and diet recommendations to lower triglycerides.  Recommend try to increase omega 3 FA over-the-counter 2000mg  daily   CKD 4 - renal function has declined Discussed completing medication assistance program application for [place]. Patient and his wife have changed their mind  and if approved for medication assistance program will try  Tommy Green  Medication management:  Reviewed and updated medication list Reviewed refill history and adherence Discussed limiting daily acetaminophen intake to 3000mg . Patient endorses he only takes acetaminophen 325mg  4 per day and only 1 hydrocodone / acetaminophen per day. Estimate he is getting about 1700mg  per day.   Follow Up:  Telephone follow up appointment with care management team member scheduled for:   1 to 2 months.   [name], PharmD Clinical Pharmacist [facility]  - [facility] [phone]

## 2023-08-09 ENCOUNTER — Telehealth: Payer: Self-pay | Admitting: Family Medicine

## 2023-08-09 MED ORDER — HYDROCODONE-ACETAMINOPHEN 10-325 MG PO TABS: 1.0000 | ORAL_TABLET | Freq: Three times a day (TID) | ORAL | 0 refills | Status: DC | PRN

## 2023-08-09 NOTE — Telephone Encounter (Signed)
Called informed refill sent in.

## 2023-08-09 NOTE — Telephone Encounter (Signed)
Last OV--06/15/23 Last RF--07/11/23---#30  Also see message regarding hydrocodone that is available

## 2023-08-09 NOTE — Addendum Note (Signed)
Addended by: Radene Gunning on: 08/09/2023 12:48 PM   Modules accepted: Orders

## 2023-08-09 NOTE — Telephone Encounter (Signed)
Pt's wife called to advise that pharmacy does not have hydrocodone 7.5 mg but they have plenty of 10 mg and wants to know if Dr. [name] can send in a 30 day supply of 10 mg to [facility] on [address]

## 2023-08-10 NOTE — Telephone Encounter (Signed)
Called left detailed message to confirm was able to pickup script sent in yesterday 08/09/23 and if any problems to let us know so we can take care of.

## 2023-08-10 NOTE — Telephone Encounter (Signed)
Call patient to confirm did pickup/get prescription sent in on 08/09/23

## 2023-08-10 NOTE — Telephone Encounter (Signed)
Pt's wife called to advise that everything is good. She was able to get the message yesterday.

## 2023-08-11 ENCOUNTER — Telehealth: Payer: Self-pay | Admitting: Family Medicine

## 2023-08-11 NOTE — Telephone Encounter (Signed)
Pt dropped off paperwork for you. Placed in your tray in front office.

## 2023-08-15 ENCOUNTER — Other Ambulatory Visit: Payer: Self-pay | Admitting: Family Medicine

## 2023-08-16 ENCOUNTER — Other Ambulatory Visit: Payer: Self-pay

## 2023-08-16 DIAGNOSIS — Z79899 Other long term (current) drug therapy: Secondary | ICD-10-CM

## 2023-08-16 DIAGNOSIS — Z111 Encounter for screening for respiratory tuberculosis: Secondary | ICD-10-CM | POA: Diagnosis not present

## 2023-08-18 ENCOUNTER — Other Ambulatory Visit: Payer: Medicare HMO | Admitting: Pharmacist

## 2023-08-18 NOTE — Progress Notes (Signed)
Pharmacy Note  08/18/2023 Name: Tommy Green MRN: [id number] DOB: November 17, 1945  Subjective: Tommy Green is a 77 y.o. year old male who is a primary care patient of Tommy Green. Clinical Pharmacist Practitioner referral was placed to assist with medication management and medication access.      CKD-4:  Current therapy: none  He has tried SGLT2 in past and had acute kidney failure that was thought to be related to start of SGLT2  Creatinine has worsened over the last 6 months. Tommy Green had recommended starting Kerendia in Feb 2024 but patient declined due to cost.  He was sent application for medication assistance program but he declined to complete.  He has tried SGLT2 in past and had acute kidney failure that was thought to be related to start of SGLT2  Patient and his wife have changed their mind about trying [place]. If approved thru medication assistance program with [employer]. They have dropped off application.   Reviewed most recent labs to determine dose for [facility]   Lab Results  Component Value Date   NA 134 (L) 06/15/2023   K 4.7 06/15/2023   CO2 22 06/15/2023   GLUCOSE 150 (H) 06/15/2023   BUN 36 (H) 06/15/2023   CREATININE 2.13 (H) 06/15/2023   CALCIUM 9.8 06/15/2023   GFR 28.91 (L) 06/15/2023    DM, insulin dependent with CKD 4:  Patient continues to follow up with Tommy Green at [facility]. His last appointment was 05/24/2023 A1c was 6.7% 05/23/2023  Current treatment: Tresiba 30 units daily and Novolog per sliding scale.  Reports that he does not need Novolog with every meal.  He checks blood glucose 3 to 4 times per day by fingerstick method. Uses Relion glucometer.  Discussed continuous Glucose Monitor in past with patient which I think would be a good option for him but he has declined.     Lab Results  Component Value Date   CREATININE 2.13 (H) 06/15/2023   CREATININE 2.00 (H) 03/17/2023   CREATININE 1.70 (H)  12/16/2022      Hypertension:  Current therapy: no current medication.   Past medications: lisinopril and amlodipine - stopped due to suspected they were causing dry mouth.  Blood pressure today at home was:   Denies dizziness, chest pain or headache.    BP Readings from Last 3 Encounters:  06/29/23 131/72  06/15/23 124/80  06/01/23 131/64    He is currently receiving [name], [name] and Novolog thru medication assistance program.   Objective: Review of patient status, including review of consultants reports, laboratory and other test data, was performed as part of comprehensive.  Lab Results  Component Value Date   CREATININE 2.13 (H) 06/15/2023   CREATININE 2.00 (H) 03/17/2023   CREATININE 1.70 (H) 12/16/2022    Lab Results  Component Value Date   HGBA1C 6.7 05/23/2023       Component Value Date/Time   CHOL 161 06/15/2023 1100   CHOL 173 06/29/2022 1456   TRIG (H) 06/15/2023 1100    611.0 Triglyceride is over 400; calculations on Lipids are invalid.   HDL 18.80 (L) 06/15/2023 1100   HDL 24 (L) 06/29/2022 1456   CHOLHDL 9 06/15/2023 1100   VLDL 63.0 (H) 05/04/2021 0852   LDLCALC 78 06/29/2022 1456   LDLDIRECT 84.0 06/15/2023 1100     Clinical ASCVD: Yes  The ASCVD Risk score (Arnett DK, et al., 2019) failed to calculate for the following reasons:   The patient  has a prior MI or stroke diagnosis    BP Readings from Last 3 Encounters:  06/29/23 131/72  06/15/23 124/80  06/01/23 131/64     Allergies  Allergen Reactions   Canagliflozin Other (See Comments)    Pt reports that the use of Invokana caused acute KIDNEY FAILURE (Tommy Green) and "allergic," per Tommy Green   Aspirin Other (See Comments)    Gastroesophageal reflux and "allergic," per MAR    Tommy Green Other (See Comments)    Myalgias, GI upset, and "allergic," per Tommy Green   Statins Other (See Comments)    Muscle pain- "allergic," per Tommy Green    Medications Reviewed Today     Reviewed by Tommy Green (Pharmacist) on 08/18/23 at 0954  Med List Status: <None>   Medication Order Taking? Sig Documenting Provider Last Dose Status Informant  acetaminophen (TYLENOL) 325 MG tablet [id number] No Take 650 mg by mouth every 6 (six) hours as needed for mild pain or headache. Provider, Historical, Green Taking Active Self           Med Note Tommy Green, Tommy Green   Wed Aug 03, 2023  3:20 PM) Reports he takes up to 4 per day  amoxicillin (AMOXIL) 500 MG tablet [id number] No Take 4 tablets (2,000 mg total) by mouth as directed. 1 HOUR PRIOR TO DENTAL APPOINTMENTS Tommy Daub, Green Taking Active   aspirin EC 81 MG tablet [id number] No Take 1 tablet (81 mg total) by mouth daily. Tommy whole. Tommy Green Taking Active   Green Complex Vitamins (Green COMPLEX PO) [id number] No Take 1 tablet by mouth daily. Provider, Historical, Green Taking Active Self  Cholecalciferol (VITAMIN D3 SUPER STRENGTH) 50 MCG (2000 UT) CAPS [id number] No Take 2,000 Units by mouth daily. Provider, Historical, Green Taking Active Self  escitalopram (LEXAPRO) 10 MG tablet [id number] No TAKE 1 TABLET DAILY Tommy Green Tommy Green Taking Active   etanercept (ENBREL MINI) 50 MG/ML injection [id number] No Inject 1 mL (50 mg total) into the skin once a week. Tommy Green Taking Active   ezetimibe (ZETIA) 10 MG tablet [id number] No TAKE 1 TABLET DAILY FOR    ELEVATED CHOLESTEROL AND   TRIGLYCERIDES Tommy Green Taking Active   fenofibrate micronized (LOFIBRA) 200 MG capsule [id number] No TAKE 1 CAPSULE DAILY BEFOREBREAKFAST Tommy Green Taking Active   finasteride (PROSCAR) 5 MG tablet [id number] No Take 1 tablet (5 mg total) by mouth daily. Tommy Green Taking Active   fluticasone [facility]) 50 MCG/ACT nasal spray [id number] No Place 2 sprays into both nostrils daily. Tommy Green Taking Active            Med Note Tommy Green, Deneane Stifter Green   Wed Sep 22, 2022  9:33 AM) Using as needed  furosemide  (LASIX) 80 MG tablet [id number] No TAKE 1 TABLET BY MOUTH EVERY DAY  Patient not taking: Reported on 06/28/2023   Tommy Green Not Taking Active            Med Note Luiz Blare, MARISSA C   Wed Jun 29, 2023  2:19 PM)    gabapentin (NEURONTIN) 100 MG capsule [id number]  Take 2 capsules (200 mg total) by mouth at bedtime. Tommy Green  Active   HYDROcodone-acetaminophen [facility]) 10-325 MG tablet [id number]  Take 1 tablet by mouth every 8 (eight) hours as needed (Pain). Tommy Green  Active   HYDROcodone-acetaminophen [facility]) 7.5-325 MG tablet [id number] No Take 1  tablet by mouth every 6 (six) hours as needed for moderate pain. Tommy Green Taking Active            Med Note Tommy Green, [place] Green   Wed Aug 03, 2023  3:21 PM) Usually only takes 1 tablet at night  HYDROcodone-acetaminophen (NORCO) 7.5-325 MG tablet [id number]  Take 1 tablet by mouth every 6 (six) hours as needed for moderate pain. Tommy Green  Active   insulin aspart (NOVOLOG FLEXPEN) 100 UNIT/ML FlexPen [id number] No Inject 0-10 Units into the skin See admin instructions. Inject 0-10 units into the skin before meals, PER SLIDING SCALE: BGL 0-200 = give nothing, 201-250 = 2 units, 251-300 = 4 units, 301-350 = 6 units, 351-400 = 8 units, >400 = 10 units, push fluids, and repeat BGL check in 2 hours. If BGL remains >400 after 2 hours, CALL Green. Provider, Historical, Green Taking Active Self  insulin degludec (TRESIBA FLEXTOUCH) 100 UNIT/ML FlexTouch Pen [id number] No Inject 30 Units into the skin daily. Provider, Historical, Green Taking Active   leflunomide (ARAVA) 10 MG tablet [id number] No Take 1 tablet (10 mg total) by mouth daily. Tommy Green Taking Active   magnesium oxide (MAG-OX) 400 MG tablet [id number] No Take 400 mg by mouth daily with lunch. Provider, Historical, Green Taking Active Self  melatonin 5 MG TABS [id number] No Take 5 mg by mouth at bedtime. Provider, Historical, Green  Taking Active Self  Multiple Vitamins-Minerals (MULTIVITAMIN WITH MINERALS) tablet [id number] No Take 1 tablet by mouth daily with breakfast. Provider, Historical, Green Taking Active Self  Omega 3 1000 MG CAPS [id number] No Take 2,000 mg by mouth daily with lunch. Provider, Historical, Green Taking Active Self  omeprazole (PRILOSEC) 20 MG capsule [id number] No Take 1 capsule (20 mg total) by mouth daily. Tommy Green Taking Active   [facility] VERIO test strip [id number] No 1 each by Other route as needed for other (Glucose). Provider, Historical, Green Taking Active Self           Med Note Tommy Green, Cindie Laroche Jan 06, 2023  9:32 AM) Per patient he has Relion meter  Potassium 99 MG TABS [id number] No Take 99 mg by mouth every evening. Provider, Historical, Green Taking Active Self  tamsulosin (FLOMAX) 0.4 MG CAPS capsule [id number] No Take 1 capsule (0.4 mg total) by mouth daily. 30 min following evening meal Tommy Green Taking Active   vitamin Green-12 (CYANOCOBALAMIN) 1000 MCG tablet [id number] No Take 1,000 mcg by mouth daily. Provider, Historical, Green Taking Active Self  vitamin E 180 MG (400 UNITS) capsule [id number] No Take 400 Units by mouth daily. Provider, Historical, Green Taking Active Self            Patient Active Problem List   Diagnosis Date Noted   Statin myopathy 06/15/2023   IDA (iron deficiency anemia) 07/06/2022   Current mild episode of major depressive disorder without prior episode (HCC) 05/17/2022   Other chronic pain 03/05/2022   Bilateral lower extremity edema 03/05/2022   Severe aortic stenosis 01/26/2022   S/P TAVR (transcatheter aortic valve replacement) 01/26/2022   Microcytic anemia 12/10/2021   Obesity (BMI 30-39.9) 12/10/2021   Near syncope 12/09/2021   Aortic stenosis 12/09/2021   Rheumatoid arthritis (HCC) 12/09/2021   Foot drop, left foot 10/02/2021   History of colonic polyps 10/02/2021   History of rheumatic fever 10/02/2021   Stress fracture of  left foot 09/03/2021   Gabapentin overdose,  accidental or unintentional, initial encounter 03/08/2021   TIA (transient ischemic attack) 03/05/2021   Body mass index (BMI) 36.0-36.9, adult 10/03/2020   Subdural hemorrhage following injury without open intracranial wound and with prolonged loss of consciousness (more than 24 hours) without return to pre-existing conscious level (HCC) 10/03/2020   AKI (acute kidney injury) (HCC)    Acute encephalopathy    SDH (subdural hematoma) (HCC) 10/24/2019   Cyclic vomiting syndrome 08/06/2019   Neuropathy 12/25/2018   AK (actinic keratosis) 08/24/2018   Neoplasm of uncertain behavior 07/27/2018   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   Tendinopathy of right gluteus medius 07/13/2018   Tendinopathy of left gluteus medius 07/13/2018   Squamous cell carcinoma in situ (SCCIS) of skin 03/24/2018   Vitamin Green deficiency 11/29/2017   Stage 3 chronic kidney disease (HCC) 10/28/2017   Primary osteoarthritis of right hip 09/11/2017   Essential hypertension 07/18/2017   Hyperlipidemia associated with type 2 diabetes mellitus (HCC) 07/18/2017   Type 2 diabetes mellitus with diabetic neuropathy, unspecified (HCC) 07/18/2017   Heart murmur 07/18/2017   Hypertriglyceridemia 07/18/2017   History of stroke 07/18/2017   DDD (degenerative disc disease), lumbar 06/15/2017   Iron deficiency anemia 06/15/2017   Stroke (HCC) 06/15/2017   Encounter for hepatitis C screening test for low risk patient 06/30/2016   Anemia 06/19/2016   Microalbuminuria due to type 2 diabetes mellitus (HCC) 03/19/2016   Hypertrophy of inferior nasal turbinate 12/20/2015   Disease of nasal cavity and sinuses 12/10/2015   Degenerative tear of acetabular labrum of left hip 11/04/2015   History of tobacco abuse 05/15/2015   Morbid (severe) obesity due to excess calories (HCC) 04/11/2015   Disorder of both eustachian tubes 01/27/2015   Maxillary sinusitis 01/13/2015   Lipoprotein deficiency  01/31/2014   Gastro-esophageal reflux disease without esophagitis 11/02/2013   Rheumatic fever 11/02/2013   Erectile dysfunction associated with type 2 diabetes mellitus (HCC) 07/31/2013   Type 2 diabetes mellitus with hyperglycemia, without long-term current use of insulin (HCC) 11/16/2012     Medication Assistance:   Patient gets [place] and [employer] thru [employer] medication assistance program - application handled by his endocrinologist office - Tommy Green.    Assessment / Plan: DM, insulin dependent: Last A1c on file was at goal  Continue current therapy and follow up with Tommy Green.  Eye exam is UTD - last 01/06/2023 Sent app for [employer] medication assistance program at last visit - patient will take completed application to Tommy Oneita Kras office.   CKD 4 - renal function has declined Reviewed application for [place]. Forwarding to PCP to review and sign if approved.  Recommended start Kerendia at 10mg  daily. Recheck potassium prior to starting and 4 weeks after initiating [place].   Medication management:  Reviewed and updated medication list Reviewed refill history and adherence  Follow Up:  Telephone follow up appointment with care management team member scheduled for:   1  month.   Tommy Green, PharmD Clinical Pharmacist [facility]  - [facility] [phone]

## 2023-08-18 NOTE — Telephone Encounter (Signed)
Reviewed application for Kerendia medication assistance program. Completed provider portion. Forwarded to PCP to review and sign if approved.  Recommend starting [place] 10mg  daily based on last eGFR and potassium. Would recommend BMET checked before starting and 4 weeks after initiation of [place].

## 2023-08-19 LAB — QUANTIFERON-TB GOLD PLUS
Mitogen-NIL: 2.72 [IU]/mL
NIL: 0.02 [IU]/mL
QuantiFERON-TB Gold Plus: NEGATIVE
TB1-NIL: 0.01 [IU]/mL
TB2-NIL: 0.01 [IU]/mL

## 2023-08-19 NOTE — Progress Notes (Signed)
TB gold negative

## 2023-08-20 DIAGNOSIS — R7989 Other specified abnormal findings of blood chemistry: Secondary | ICD-10-CM | POA: Diagnosis not present

## 2023-08-20 DIAGNOSIS — R0789 Other chest pain: Secondary | ICD-10-CM | POA: Diagnosis not present

## 2023-08-20 DIAGNOSIS — E162 Hypoglycemia, unspecified: Secondary | ICD-10-CM | POA: Diagnosis not present

## 2023-08-20 DIAGNOSIS — E161 Other hypoglycemia: Secondary | ICD-10-CM | POA: Diagnosis not present

## 2023-08-20 DIAGNOSIS — I517 Cardiomegaly: Secondary | ICD-10-CM | POA: Diagnosis not present

## 2023-08-20 DIAGNOSIS — R079 Chest pain, unspecified: Secondary | ICD-10-CM | POA: Diagnosis not present

## 2023-08-20 DIAGNOSIS — N179 Acute kidney failure, unspecified: Secondary | ICD-10-CM | POA: Diagnosis not present

## 2023-08-20 DIAGNOSIS — Z981 Arthrodesis status: Secondary | ICD-10-CM | POA: Diagnosis not present

## 2023-08-21 DIAGNOSIS — K812 Acute cholecystitis with chronic cholecystitis: Secondary | ICD-10-CM | POA: Diagnosis not present

## 2023-08-21 DIAGNOSIS — I10 Essential (primary) hypertension: Secondary | ICD-10-CM | POA: Diagnosis not present

## 2023-08-21 DIAGNOSIS — Z981 Arthrodesis status: Secondary | ICD-10-CM | POA: Diagnosis not present

## 2023-08-21 DIAGNOSIS — K8064 Calculus of gallbladder and bile duct with chronic cholecystitis without obstruction: Secondary | ICD-10-CM | POA: Diagnosis not present

## 2023-08-21 DIAGNOSIS — M069 Rheumatoid arthritis, unspecified: Secondary | ICD-10-CM | POA: Diagnosis not present

## 2023-08-21 DIAGNOSIS — K219 Gastro-esophageal reflux disease without esophagitis: Secondary | ICD-10-CM | POA: Diagnosis not present

## 2023-08-21 DIAGNOSIS — I517 Cardiomegaly: Secondary | ICD-10-CM | POA: Diagnosis not present

## 2023-08-21 DIAGNOSIS — R079 Chest pain, unspecified: Secondary | ICD-10-CM | POA: Diagnosis not present

## 2023-08-21 DIAGNOSIS — Z8249 Family history of ischemic heart disease and other diseases of the circulatory system: Secondary | ICD-10-CM | POA: Diagnosis not present

## 2023-08-21 DIAGNOSIS — K8071 Calculus of gallbladder and bile duct without cholecystitis with obstruction: Secondary | ICD-10-CM | POA: Diagnosis not present

## 2023-08-21 DIAGNOSIS — I129 Hypertensive chronic kidney disease with stage 1 through stage 4 chronic kidney disease, or unspecified chronic kidney disease: Secondary | ICD-10-CM | POA: Diagnosis not present

## 2023-08-21 DIAGNOSIS — F32A Depression, unspecified: Secondary | ICD-10-CM | POA: Diagnosis not present

## 2023-08-21 DIAGNOSIS — Z794 Long term (current) use of insulin: Secondary | ICD-10-CM | POA: Diagnosis not present

## 2023-08-21 DIAGNOSIS — F419 Anxiety disorder, unspecified: Secondary | ICD-10-CM | POA: Diagnosis not present

## 2023-08-21 DIAGNOSIS — K5730 Diverticulosis of large intestine without perforation or abscess without bleeding: Secondary | ICD-10-CM | POA: Diagnosis not present

## 2023-08-21 DIAGNOSIS — K8020 Calculus of gallbladder without cholecystitis without obstruction: Secondary | ICD-10-CM | POA: Diagnosis not present

## 2023-08-21 DIAGNOSIS — K8010 Calculus of gallbladder with chronic cholecystitis without obstruction: Secondary | ICD-10-CM | POA: Diagnosis not present

## 2023-08-21 DIAGNOSIS — Z888 Allergy status to other drugs, medicaments and biological substances status: Secondary | ICD-10-CM | POA: Diagnosis not present

## 2023-08-21 DIAGNOSIS — Z87891 Personal history of nicotine dependence: Secondary | ICD-10-CM | POA: Diagnosis not present

## 2023-08-21 DIAGNOSIS — R10816 Epigastric abdominal tenderness: Secondary | ICD-10-CM | POA: Diagnosis not present

## 2023-08-21 DIAGNOSIS — Z79899 Other long term (current) drug therapy: Secondary | ICD-10-CM | POA: Diagnosis not present

## 2023-08-21 DIAGNOSIS — K828 Other specified diseases of gallbladder: Secondary | ICD-10-CM | POA: Diagnosis not present

## 2023-08-21 DIAGNOSIS — I440 Atrioventricular block, first degree: Secondary | ICD-10-CM | POA: Diagnosis not present

## 2023-08-21 DIAGNOSIS — R7989 Other specified abnormal findings of blood chemistry: Secondary | ICD-10-CM | POA: Diagnosis not present

## 2023-08-21 DIAGNOSIS — D1803 Hemangioma of intra-abdominal structures: Secondary | ICD-10-CM | POA: Diagnosis not present

## 2023-08-21 DIAGNOSIS — E785 Hyperlipidemia, unspecified: Secondary | ICD-10-CM | POA: Diagnosis not present

## 2023-08-21 DIAGNOSIS — Z8673 Personal history of transient ischemic attack (TIA), and cerebral infarction without residual deficits: Secondary | ICD-10-CM | POA: Diagnosis not present

## 2023-08-21 DIAGNOSIS — E119 Type 2 diabetes mellitus without complications: Secondary | ICD-10-CM | POA: Diagnosis not present

## 2023-08-21 DIAGNOSIS — K8309 Other cholangitis: Secondary | ICD-10-CM | POA: Diagnosis not present

## 2023-08-21 DIAGNOSIS — K8067 Calculus of gallbladder and bile duct with acute and chronic cholecystitis with obstruction: Secondary | ICD-10-CM | POA: Diagnosis not present

## 2023-08-21 DIAGNOSIS — E1122 Type 2 diabetes mellitus with diabetic chronic kidney disease: Secondary | ICD-10-CM | POA: Diagnosis not present

## 2023-08-21 DIAGNOSIS — E11649 Type 2 diabetes mellitus with hypoglycemia without coma: Secondary | ICD-10-CM | POA: Diagnosis not present

## 2023-08-21 DIAGNOSIS — N184 Chronic kidney disease, stage 4 (severe): Secondary | ICD-10-CM | POA: Diagnosis not present

## 2023-08-21 DIAGNOSIS — I5A Non-ischemic myocardial injury (non-traumatic): Secondary | ICD-10-CM | POA: Diagnosis not present

## 2023-08-21 DIAGNOSIS — Z85828 Personal history of other malignant neoplasm of skin: Secondary | ICD-10-CM | POA: Diagnosis not present

## 2023-08-21 DIAGNOSIS — Z952 Presence of prosthetic heart valve: Secondary | ICD-10-CM | POA: Diagnosis not present

## 2023-08-21 DIAGNOSIS — N400 Enlarged prostate without lower urinary tract symptoms: Secondary | ICD-10-CM | POA: Diagnosis not present

## 2023-08-21 DIAGNOSIS — D84821 Immunodeficiency due to drugs: Secondary | ICD-10-CM | POA: Diagnosis not present

## 2023-08-21 DIAGNOSIS — N2889 Other specified disorders of kidney and ureter: Secondary | ICD-10-CM | POA: Diagnosis not present

## 2023-08-21 DIAGNOSIS — Z887 Allergy status to serum and vaccine status: Secondary | ICD-10-CM | POA: Diagnosis not present

## 2023-08-21 DIAGNOSIS — N179 Acute kidney failure, unspecified: Secondary | ICD-10-CM | POA: Diagnosis not present

## 2023-08-21 DIAGNOSIS — K660 Peritoneal adhesions (postprocedural) (postinfection): Secondary | ICD-10-CM | POA: Diagnosis not present

## 2023-08-21 DIAGNOSIS — Z66 Do not resuscitate: Secondary | ICD-10-CM | POA: Diagnosis not present

## 2023-08-21 DIAGNOSIS — N281 Cyst of kidney, acquired: Secondary | ICD-10-CM | POA: Diagnosis not present

## 2023-08-29 ENCOUNTER — Encounter: Payer: Self-pay | Admitting: Family Medicine

## 2023-08-29 ENCOUNTER — Ambulatory Visit (INDEPENDENT_AMBULATORY_CARE_PROVIDER_SITE_OTHER): Payer: Medicare HMO | Admitting: Family Medicine

## 2023-08-29 VITALS — BP 128/70 | HR 80 | Temp 98.0°F | Resp 16 | Ht 71.0 in | Wt 214.0 lb

## 2023-08-29 DIAGNOSIS — I10 Essential (primary) hypertension: Secondary | ICD-10-CM | POA: Diagnosis not present

## 2023-08-29 DIAGNOSIS — I959 Hypotension, unspecified: Secondary | ICD-10-CM | POA: Diagnosis not present

## 2023-08-29 DIAGNOSIS — K8309 Other cholangitis: Secondary | ICD-10-CM | POA: Diagnosis not present

## 2023-08-29 DIAGNOSIS — Z23 Encounter for immunization: Secondary | ICD-10-CM | POA: Diagnosis not present

## 2023-08-29 DIAGNOSIS — K219 Gastro-esophageal reflux disease without esophagitis: Secondary | ICD-10-CM | POA: Diagnosis not present

## 2023-08-29 DIAGNOSIS — Z79891 Long term (current) use of opiate analgesic: Secondary | ICD-10-CM | POA: Diagnosis not present

## 2023-08-29 DIAGNOSIS — Z48815 Encounter for surgical aftercare following surgery on the digestive system: Secondary | ICD-10-CM | POA: Diagnosis not present

## 2023-08-29 DIAGNOSIS — Z794 Long term (current) use of insulin: Secondary | ICD-10-CM | POA: Diagnosis not present

## 2023-08-29 DIAGNOSIS — I38 Endocarditis, valve unspecified: Secondary | ICD-10-CM | POA: Diagnosis not present

## 2023-08-29 DIAGNOSIS — M810 Age-related osteoporosis without current pathological fracture: Secondary | ICD-10-CM | POA: Diagnosis not present

## 2023-08-29 DIAGNOSIS — D72829 Elevated white blood cell count, unspecified: Secondary | ICD-10-CM | POA: Diagnosis not present

## 2023-08-29 DIAGNOSIS — E860 Dehydration: Secondary | ICD-10-CM | POA: Diagnosis not present

## 2023-08-29 DIAGNOSIS — Z79810 Long term (current) use of selective estrogen receptor modulators (SERMs): Secondary | ICD-10-CM | POA: Diagnosis not present

## 2023-08-29 DIAGNOSIS — K7290 Hepatic failure, unspecified without coma: Secondary | ICD-10-CM | POA: Diagnosis not present

## 2023-08-29 DIAGNOSIS — D649 Anemia, unspecified: Secondary | ICD-10-CM | POA: Diagnosis not present

## 2023-08-29 DIAGNOSIS — N179 Acute kidney failure, unspecified: Secondary | ICD-10-CM | POA: Diagnosis not present

## 2023-08-29 DIAGNOSIS — M069 Rheumatoid arthritis, unspecified: Secondary | ICD-10-CM | POA: Diagnosis not present

## 2023-08-29 DIAGNOSIS — F419 Anxiety disorder, unspecified: Secondary | ICD-10-CM | POA: Diagnosis not present

## 2023-08-29 DIAGNOSIS — J45909 Unspecified asthma, uncomplicated: Secondary | ICD-10-CM | POA: Diagnosis not present

## 2023-08-29 DIAGNOSIS — I442 Atrioventricular block, complete: Secondary | ICD-10-CM | POA: Diagnosis not present

## 2023-08-29 DIAGNOSIS — R112 Nausea with vomiting, unspecified: Secondary | ICD-10-CM | POA: Diagnosis not present

## 2023-08-29 DIAGNOSIS — F339 Major depressive disorder, recurrent, unspecified: Secondary | ICD-10-CM

## 2023-08-29 DIAGNOSIS — M1611 Unilateral primary osteoarthritis, right hip: Secondary | ICD-10-CM | POA: Diagnosis not present

## 2023-08-29 DIAGNOSIS — E119 Type 2 diabetes mellitus without complications: Secondary | ICD-10-CM | POA: Diagnosis not present

## 2023-08-29 DIAGNOSIS — K831 Obstruction of bile duct: Secondary | ICD-10-CM | POA: Diagnosis not present

## 2023-08-29 DIAGNOSIS — Z8673 Personal history of transient ischemic attack (TIA), and cerebral infarction without residual deficits: Secondary | ICD-10-CM | POA: Diagnosis not present

## 2023-08-29 DIAGNOSIS — M21372 Foot drop, left foot: Secondary | ICD-10-CM | POA: Diagnosis not present

## 2023-08-29 MED ORDER — ESCITALOPRAM OXALATE 20 MG PO TABS: 20.0000 mg | ORAL_TABLET | Freq: Every day | ORAL | 2 refills | Status: AC

## 2023-08-29 NOTE — Progress Notes (Signed)
Chief Complaint  Patient presents with   Follow-up    Follow up   Post-op Follow-up    Post-op follow up    Subjective: Patient is a 76 y.o. male here for f/u cholecystectomy/hospitalization. Here w wife, Dennie Bible.   Patient was admitted to [facility] on 08/20/2023 and discharged on 08/24/2023 for ascending cholangitis.  He had a laparoscopic cholecystectomy done on 11//24.  His initial labs started to improve.  He has an appointment with his surgeon on 09/07/2023.  Since hospitalization, the patient reports no more GI s/s's. BM's are at baseline. No N/V/D, fevers, decreased oral intake, shortness of breath. He is devoid of energy. He takes Lexapro 10 mg/d for depression. Has PT starting soon. Feels like mood is depressed. Not so much anxiety. Poor health is playing a role. Has worsening memory as well. No SI or HI, no self medication. He is not following with a therapist.   Past Medical History:  Diagnosis Date   Anemia    Essential hypertension 07/18/2017   Foot drop, left foot    due to back surgery in 01/2021   GERD (gastroesophageal reflux disease)    History of colonic polyps    History of hiatal hernia    many years ago   History of rheumatic fever    History of stroke 07/18/2017   Hypertriglyceridemia 07/18/2017   S/P TAVR (transcatheter aortic valve replacement) 01/26/2022   [name] 29mm via TF approach with Dr. Lynnette Caffey and Dr. Laneta Simmers   Severe aortic stenosis    Type 2 diabetes mellitus with diabetic neuropathy, unspecified (HCC) 07/18/2017    Objective: BP 128/70 (BP Location: Left Arm, Patient Position: Sitting, Cuff Size: Normal)   Pulse 80   Temp 98 F (36.7 C) (Oral)   Resp 16   Ht 5\' 11"  (1.803 m)   Wt 214 lb (97.1 kg)   SpO2 97%   BMI 29.85 kg/m  General: Awake, appears stated age Heart: RRR, no LE edema Lungs: CTAB, no rales, wheezes or rhonchi. No accessory muscle use Abd: BS+, S, port holes c/d/I without excessive erythema, drainage,  fluctuance Psych: Age appropriate judgment and insight, normal affect and mood  Assessment and Plan: Cholangitis - Plan: CBC, Comprehensive metabolic panel  Depression, recurrent (HCC)  Need for influenza vaccination - Plan: Flu Vaccine Trivalent High Dose (Fluad)  F/u on labs. Has appt w gen surg next week. Chronic, uncontrolled. Increase Lexapro from 10 mg/d to 20 mg/d. Counseling info and anxiety coping techniques provided in AVS.  F/u in 6 weeks to reck.  The patient and his spouse voiced understanding and agreement to the plan.  Jilda Roche Oldham, DO 08/29/23  3:08 PM

## 2023-08-29 NOTE — Patient Instructions (Signed)
Give [place] 2-3 business days to get the results of your labs back.   Stay active.  Please consider counseling. Contact [phone] to schedule an appointment or inquire about cost/insurance coverage.  [facility] located at [address], [address], [place], [place].  Phone number = [phone].  Dr. Regan Lemming - Adult Psychiatry.    [facility] located at [address] [place], [place], [place]. Phone number = [phone].   [facility] located at [address] [place], [place].  Phone number = [phone].   [facility] located at [address] [place], [place], [place].  Phone number = [phone].  Coping skills Choose 5 that work for you: Take a deep breath Count to 20 Read a book Do a puzzle Meditate Bake Sing Knit Garden Pray Go outside Call a friend Listen to music Take a walk Color Send a note Take a bath Watch a movie Be alone in a quiet place Pet an animal Visit a friend Journal Exercise Stretch   Let us know if you need anything.

## 2023-08-30 ENCOUNTER — Other Ambulatory Visit: Payer: Self-pay

## 2023-08-30 DIAGNOSIS — R748 Abnormal levels of other serum enzymes: Secondary | ICD-10-CM

## 2023-08-30 LAB — CBC
HCT: 25.7 % — ABNORMAL LOW (ref 39.0–52.0)
Hemoglobin: 8.4 g/dL — ABNORMAL LOW (ref 13.0–17.0)
MCHC: 32.8 g/dL (ref 30.0–36.0)
MCV: 86.2 fL (ref 78.0–100.0)
Platelets: 305 10*3/uL (ref 150.0–400.0)
RBC: 2.99 Mil/uL — ABNORMAL LOW (ref 4.22–5.81)
RDW: 16 % — ABNORMAL HIGH (ref 11.5–15.5)
WBC: 9.9 10*3/uL (ref 4.0–10.5)

## 2023-08-30 LAB — COMPREHENSIVE METABOLIC PANEL
ALT: 53 U/L (ref 0–53)
AST: 29 U/L (ref 0–37)
Albumin: 3.3 g/dL — ABNORMAL LOW (ref 3.5–5.2)
Alkaline Phosphatase: 144 U/L — ABNORMAL HIGH (ref 39–117)
BUN: 26 mg/dL — ABNORMAL HIGH (ref 6–23)
CO2: 22 meq/L (ref 19–32)
Calcium: 8.5 mg/dL (ref 8.4–10.5)
Chloride: 102 meq/L (ref 96–112)
Creatinine, Ser: 1.5 mg/dL (ref 0.40–1.50)
GFR: 43.97 mL/min — ABNORMAL LOW (ref >60.00)
Glucose, Bld: 213 mg/dL — ABNORMAL HIGH (ref 70–99)
Potassium: 4.5 meq/L (ref 3.5–5.1)
Sodium: 134 meq/L — ABNORMAL LOW (ref 135–145)
Total Bilirubin: 0.5 mg/dL (ref 0.2–1.2)
Total Protein: 5.9 g/dL — ABNORMAL LOW (ref 6.0–8.3)

## 2023-08-30 NOTE — Addendum Note (Signed)
Addended by: Kathi Ludwig on: 08/30/2023 05:04 PM   Modules accepted: Orders

## 2023-08-31 DIAGNOSIS — I959 Hypotension, unspecified: Secondary | ICD-10-CM | POA: Diagnosis not present

## 2023-08-31 DIAGNOSIS — Z48815 Encounter for surgical aftercare following surgery on the digestive system: Secondary | ICD-10-CM | POA: Diagnosis not present

## 2023-08-31 DIAGNOSIS — I10 Essential (primary) hypertension: Secondary | ICD-10-CM | POA: Diagnosis not present

## 2023-08-31 DIAGNOSIS — K219 Gastro-esophageal reflux disease without esophagitis: Secondary | ICD-10-CM | POA: Diagnosis not present

## 2023-08-31 DIAGNOSIS — D649 Anemia, unspecified: Secondary | ICD-10-CM | POA: Diagnosis not present

## 2023-08-31 DIAGNOSIS — F419 Anxiety disorder, unspecified: Secondary | ICD-10-CM | POA: Diagnosis not present

## 2023-08-31 DIAGNOSIS — R112 Nausea with vomiting, unspecified: Secondary | ICD-10-CM | POA: Diagnosis not present

## 2023-08-31 DIAGNOSIS — M21372 Foot drop, left foot: Secondary | ICD-10-CM | POA: Diagnosis not present

## 2023-08-31 DIAGNOSIS — N179 Acute kidney failure, unspecified: Secondary | ICD-10-CM | POA: Diagnosis not present

## 2023-08-31 DIAGNOSIS — J45909 Unspecified asthma, uncomplicated: Secondary | ICD-10-CM | POA: Diagnosis not present

## 2023-08-31 DIAGNOSIS — Z794 Long term (current) use of insulin: Secondary | ICD-10-CM | POA: Diagnosis not present

## 2023-08-31 DIAGNOSIS — M1611 Unilateral primary osteoarthritis, right hip: Secondary | ICD-10-CM | POA: Diagnosis not present

## 2023-08-31 DIAGNOSIS — M810 Age-related osteoporosis without current pathological fracture: Secondary | ICD-10-CM | POA: Diagnosis not present

## 2023-08-31 DIAGNOSIS — E860 Dehydration: Secondary | ICD-10-CM | POA: Diagnosis not present

## 2023-08-31 DIAGNOSIS — Z79891 Long term (current) use of opiate analgesic: Secondary | ICD-10-CM | POA: Diagnosis not present

## 2023-08-31 DIAGNOSIS — D72829 Elevated white blood cell count, unspecified: Secondary | ICD-10-CM | POA: Diagnosis not present

## 2023-08-31 DIAGNOSIS — K831 Obstruction of bile duct: Secondary | ICD-10-CM | POA: Diagnosis not present

## 2023-08-31 DIAGNOSIS — Z8673 Personal history of transient ischemic attack (TIA), and cerebral infarction without residual deficits: Secondary | ICD-10-CM | POA: Diagnosis not present

## 2023-08-31 DIAGNOSIS — M069 Rheumatoid arthritis, unspecified: Secondary | ICD-10-CM | POA: Diagnosis not present

## 2023-08-31 DIAGNOSIS — I38 Endocarditis, valve unspecified: Secondary | ICD-10-CM | POA: Diagnosis not present

## 2023-08-31 DIAGNOSIS — I442 Atrioventricular block, complete: Secondary | ICD-10-CM | POA: Diagnosis not present

## 2023-08-31 DIAGNOSIS — Z79810 Long term (current) use of selective estrogen receptor modulators (SERMs): Secondary | ICD-10-CM | POA: Diagnosis not present

## 2023-08-31 DIAGNOSIS — K7290 Hepatic failure, unspecified without coma: Secondary | ICD-10-CM | POA: Diagnosis not present

## 2023-08-31 DIAGNOSIS — E119 Type 2 diabetes mellitus without complications: Secondary | ICD-10-CM | POA: Diagnosis not present

## 2023-09-01 NOTE — Telephone Encounter (Signed)
Application for [place] faxed to [employer] patient assistance foundation.

## 2023-09-02 DIAGNOSIS — I959 Hypotension, unspecified: Secondary | ICD-10-CM | POA: Diagnosis not present

## 2023-09-02 DIAGNOSIS — I38 Endocarditis, valve unspecified: Secondary | ICD-10-CM | POA: Diagnosis not present

## 2023-09-02 DIAGNOSIS — N179 Acute kidney failure, unspecified: Secondary | ICD-10-CM | POA: Diagnosis not present

## 2023-09-02 DIAGNOSIS — M810 Age-related osteoporosis without current pathological fracture: Secondary | ICD-10-CM | POA: Diagnosis not present

## 2023-09-02 DIAGNOSIS — R112 Nausea with vomiting, unspecified: Secondary | ICD-10-CM | POA: Diagnosis not present

## 2023-09-02 DIAGNOSIS — F419 Anxiety disorder, unspecified: Secondary | ICD-10-CM | POA: Diagnosis not present

## 2023-09-02 DIAGNOSIS — D72829 Elevated white blood cell count, unspecified: Secondary | ICD-10-CM | POA: Diagnosis not present

## 2023-09-02 DIAGNOSIS — Z79891 Long term (current) use of opiate analgesic: Secondary | ICD-10-CM | POA: Diagnosis not present

## 2023-09-02 DIAGNOSIS — E119 Type 2 diabetes mellitus without complications: Secondary | ICD-10-CM | POA: Diagnosis not present

## 2023-09-02 DIAGNOSIS — Z48815 Encounter for surgical aftercare following surgery on the digestive system: Secondary | ICD-10-CM | POA: Diagnosis not present

## 2023-09-02 DIAGNOSIS — K219 Gastro-esophageal reflux disease without esophagitis: Secondary | ICD-10-CM | POA: Diagnosis not present

## 2023-09-02 DIAGNOSIS — D649 Anemia, unspecified: Secondary | ICD-10-CM | POA: Diagnosis not present

## 2023-09-02 DIAGNOSIS — K831 Obstruction of bile duct: Secondary | ICD-10-CM | POA: Diagnosis not present

## 2023-09-02 DIAGNOSIS — Z8673 Personal history of transient ischemic attack (TIA), and cerebral infarction without residual deficits: Secondary | ICD-10-CM | POA: Diagnosis not present

## 2023-09-02 DIAGNOSIS — I442 Atrioventricular block, complete: Secondary | ICD-10-CM | POA: Diagnosis not present

## 2023-09-02 DIAGNOSIS — Z79810 Long term (current) use of selective estrogen receptor modulators (SERMs): Secondary | ICD-10-CM | POA: Diagnosis not present

## 2023-09-02 DIAGNOSIS — J45909 Unspecified asthma, uncomplicated: Secondary | ICD-10-CM | POA: Diagnosis not present

## 2023-09-02 DIAGNOSIS — M069 Rheumatoid arthritis, unspecified: Secondary | ICD-10-CM | POA: Diagnosis not present

## 2023-09-02 DIAGNOSIS — Z794 Long term (current) use of insulin: Secondary | ICD-10-CM | POA: Diagnosis not present

## 2023-09-02 DIAGNOSIS — K7290 Hepatic failure, unspecified without coma: Secondary | ICD-10-CM | POA: Diagnosis not present

## 2023-09-02 DIAGNOSIS — M21372 Foot drop, left foot: Secondary | ICD-10-CM | POA: Diagnosis not present

## 2023-09-02 DIAGNOSIS — M1611 Unilateral primary osteoarthritis, right hip: Secondary | ICD-10-CM | POA: Diagnosis not present

## 2023-09-02 DIAGNOSIS — E860 Dehydration: Secondary | ICD-10-CM | POA: Diagnosis not present

## 2023-09-02 DIAGNOSIS — I10 Essential (primary) hypertension: Secondary | ICD-10-CM | POA: Diagnosis not present

## 2023-09-05 ENCOUNTER — Ambulatory Visit: Payer: Medicare HMO | Admitting: Pharmacist

## 2023-09-05 DIAGNOSIS — I10 Essential (primary) hypertension: Secondary | ICD-10-CM

## 2023-09-05 DIAGNOSIS — G720 Drug-induced myopathy: Secondary | ICD-10-CM

## 2023-09-05 DIAGNOSIS — N1832 Chronic kidney disease, stage 3b: Secondary | ICD-10-CM

## 2023-09-05 DIAGNOSIS — E1140 Type 2 diabetes mellitus with diabetic neuropathy, unspecified: Secondary | ICD-10-CM

## 2023-09-05 NOTE — Progress Notes (Signed)
Pharmacy Note  09/05/2023 Name: Tommy Green MRN: [id number] DOB: Mar 07, 1946  Subjective: Tommy Green is a 77 y.o. year old male who is a primary care patient of Tommy Dory, DO. Clinical Pharmacist Practitioner referral was placed to assist with medication management and medication access.     Hospitalization 11/02/024 - Patient presented with right upper quadrant pain associated with nausea, vomiting, diarrhea. Ultrasound of abdomen showed cholelithiasis on 11/3. Surgery was consulted in the ED, and recommended MRCP. MRCP was obtained which showed multiple small gallstones, with at least 4 in the common bile duct up to 0.7 cm in caliber, and stated that the common bile duct was distended with minimal intrahepatic biliary dilatation. Patient underwent a laparoscopic cholecystectomy on 11/5.    CKD-4:  Current therapy: none  He has tried SGLT2 in past and had acute kidney failure that was thought to be related to start of SGLT2  Creatinine has worsened over the last 6 months. Tommy Green had recommended starting Kerendia in Feb 2024 but patient declined due to cost.  He was sent application for medication assistance program but he declined to complete.  He has tried SGLT2 in past and had acute kidney failure that was thought to be related to start of SGLT2  We have submitted patient assistance program for Tommy Green. If approved thru medication assistance program with Tommy Green will need to check BMET / potassium before starting.  Reviewed most recent labs to determine dose for [facility]   Lab Results  Component Value Date   NA 134 (L) 08/29/2023   K 4.5 08/29/2023   CO2 22 08/29/2023   GLUCOSE 213 (H) 08/29/2023   BUN 26 (H) 08/29/2023   CREATININE 1.50 08/29/2023   CALCIUM 8.5 08/29/2023   GFR 43.97 (L) 08/29/2023   EGFR 33 (L) 03/17/2023   GFRNONAA 36 (L) 08/17/2022     DM, insulin dependent with CKD 4:  Patient continues to follow up with Tommy Tommy Green at  [facility]. His last appointment was 05/24/2023 A1c was 6.7% 05/23/2023  Current treatment: Tresiba 30 units daily and Novolog per sliding scale.  Reports that he does not need Novolog with every meal.  Using Novolog per sliding scale 0 to 20 units based on blood glucose readings.  He checks blood glucose 3 to 4 times per day by fingerstick method. Uses Relion glucometer.  Discussed continuous Glucose Monitor in past with patient which I think would be a good option for him but he has declined.     Lab Results  Component Value Date   CREATININE 1.50 08/29/2023   CREATININE 2.13 (H) 06/15/2023   CREATININE 2.00 (H) 03/17/2023      Hypertension:  Current therapy: no current medication.   Past medications: lisinopril and amlodipine - stopped due to suspected they were causing dry mouth.  Blood pressure today at home was:   Denies dizziness, chest pain or headache.    BP Readings from Last 3 Encounters:  08/29/23 128/70  06/29/23 131/72  06/15/23 124/80    He is currently receiving [name], [name] and Novolog thru medication assistance program.   Objective: Review of patient status, including review of consultants reports, laboratory and other test data, was performed as part of comprehensive.  Lab Results  Component Value Date   CREATININE 1.50 08/29/2023   CREATININE 2.13 (H) 06/15/2023   CREATININE 2.00 (H) 03/17/2023    Lab Results  Component Value Date   HGBA1C 6.7 05/23/2023  Component Value Date/Time   CHOL 161 06/15/2023 1100   CHOL 173 06/29/2022 1456   TRIG (H) 06/15/2023 1100    611.0 Triglyceride is over 400; calculations on Lipids are invalid.   HDL 18.80 (L) 06/15/2023 1100   HDL 24 (L) 06/29/2022 1456   CHOLHDL 9 06/15/2023 1100   VLDL 63.0 (H) 05/04/2021 0852   LDLCALC 78 06/29/2022 1456   LDLDIRECT 84.0 06/15/2023 1100     Clinical ASCVD: Yes  The ASCVD Risk score (Arnett DK, et al., 2019) failed to calculate for the  following reasons:   The patient has a prior MI or stroke diagnosis    BP Readings from Last 3 Encounters:  08/29/23 128/70  06/29/23 131/72  06/15/23 124/80     Allergies  Allergen Reactions   Canagliflozin Other (See Comments)    Pt reports that the use of Invokana caused acute KIDNEY FAILURE ([facility] records) and "allergic," per [facility]   Aspirin Other (See Comments)    Gastroesophageal reflux and "allergic," per [facility]    Atorvastatin Other (See Comments)    Myalgias, GI upset, and "allergic," per [facility]   Statins Other (See Comments)    Muscle pain- "allergic," per [facility]    Medications Reviewed Today     Reviewed by Tommy Green, Tommy Green (Pharmacist) on 09/05/23 at 1452  Med List Status: <None>   Medication Order Taking? Sig Documenting Provider Last Dose Status Informant  acetaminophen (TYLENOL) 325 MG tablet [id number] No Take 650 mg by mouth every 6 (six) hours as needed for mild pain or headache. [provider] Taking Active Self           Med Note Tommy Green, Tommy Green   Wed Aug 03, 2023  3:20 PM) Reports he takes up to 4 per day  amoxicillin (AMOXIL) 500 MG tablet [id number] No Take 4 tablets (2,000 mg total) by mouth as directed. 1 HOUR PRIOR TO DENTAL APPOINTMENTS Tommy Daub, MD Taking Active   aspirin EC 81 MG tablet [id number] No Take 1 tablet (81 mg total) by mouth daily. Swallow whole. Tommy Chard D, NP Taking Active   Green Complex Vitamins (Green COMPLEX PO) [id number] No Take 1 tablet by mouth daily. [provider] Taking Active Self  Cholecalciferol (VITAMIN D3 SUPER STRENGTH) 50 MCG (2000 UT) CAPS [id number] No Take 2,000 Units by mouth daily. [provider] Taking Active Self  escitalopram (LEXAPRO) 20 MG tablet [id number]  Take 1 tablet (20 mg total) by mouth daily. Tommy Dory, DO  Active   etanercept (ENBREL MINI) 50 MG/ML injection [id number] No Inject 1 mL (50 mg total) into the skin once a week. Gearldine Bienenstock, PA-C Taking Active    ezetimibe (ZETIA) 10 MG tablet [id number] No TAKE 1 TABLET DAILY FOR    ELEVATED CHOLESTEROL AND   TRIGLYCERIDES Tommy Dory, DO Taking Active   fenofibrate micronized (LOFIBRA) 200 MG capsule [id number] No TAKE 1 CAPSULE DAILY BEFOREBREAKFAST Tommy Dory, DO Taking Active   finasteride (PROSCAR) 5 MG tablet [id number] No Take 1 tablet (5 mg total) by mouth daily. Joline Maxcy, MD Taking Active   fluticasone [facility]) 50 MCG/ACT nasal spray [id number] No Place 2 sprays into both nostrils daily. Tommy Dory, DO Taking Active            Med Note Tommy Green, Leeah Politano Green   Wed Sep 22, 2022  9:33 AM) Using as needed  furosemide (LASIX) 80 MG tablet [id number] No TAKE 1 TABLET BY MOUTH EVERY  DAY Tommy Dory, DO Taking Active            Med Note Luiz Blare, MARISSA C   Wed Jun 29, 2023  2:19 PM)    gabapentin (NEURONTIN) 100 MG capsule [id number] No Take 2 capsules (200 mg total) by mouth at bedtime. Tommy Dory, DO Taking Active   HYDROcodone-acetaminophen [facility]) 10-325 MG tablet [id number] No Take 1 tablet by mouth every 8 (eight) hours as needed (Pain). Tommy Dory, DO Taking Active   HYDROcodone-acetaminophen (NORCO) 7.5-325 MG tablet [id number] No Take 1 tablet by mouth every 6 (six) hours as needed for moderate pain. Tommy Dory, DO Taking Active            Med Note Tommy Green, [place] Green   Wed Aug 03, 2023  3:21 PM) Usually only takes 1 tablet at night  HYDROcodone-acetaminophen (NORCO) 7.5-325 MG tablet [id number] No Take 1 tablet by mouth every 6 (six) hours as needed for moderate pain. Tommy Dory, DO Taking Active   insulin aspart (NOVOLOG FLEXPEN) 100 UNIT/ML FlexPen [id number] No Inject 0-10 Units into the skin See admin instructions. Inject 0-10 units into the skin before meals, PER SLIDING SCALE: BGL 0-200 = give nothing, 201-250 = 2 units, 251-300 = 4 units, 301-350 = 6 units, 351-400 = 8 units, >400 = 10 units, push  fluids, and repeat BGL check in 2 hours. If BGL remains >400 after 2 hours, CALL MD. [provider] Taking Active Self  insulin degludec (TRESIBA FLEXTOUCH) 100 UNIT/ML FlexTouch Pen [id number] No Inject 30 Units into the skin daily. [provider] Taking Active   leflunomide (ARAVA) 10 MG tablet [id number] No Take 1 tablet (10 mg total) by mouth daily. Gearldine Bienenstock, PA-C Taking Active   magnesium oxide (MAG-OX) 400 MG tablet [id number] No Take 400 mg by mouth daily with lunch. [provider] Taking Active Self  melatonin 5 MG TABS [id number] No Take 5 mg by mouth at bedtime. [provider] Taking Active Self  Multiple Vitamins-Minerals (MULTIVITAMIN WITH MINERALS) tablet [id number] No Take 1 tablet by mouth daily with breakfast. [provider] Taking Active Self  Omega 3 1000 MG CAPS [id number] No Take 2,000 mg by mouth daily with lunch. [provider] Taking Active Self  omeprazole (PRILOSEC) 20 MG capsule [id number] No Take 1 capsule (20 mg total) by mouth daily. Tommy Dory, DO Taking Active   [facility] VERIO test strip [id number] No 1 each by Other route as needed for other (Glucose). [provider] Taking Active Self           Med Note Tommy Green, Cindie Laroche Jan 06, 2023  9:32 AM) Per patient he has Relion meter  Potassium 99 MG TABS [id number] No Take 99 mg by mouth every evening. [provider] Taking Active Self  tamsulosin (FLOMAX) 0.4 MG CAPS capsule [id number] No Take 1 capsule (0.4 mg total) by mouth daily. 30 min following evening meal Joline Maxcy, MD Taking Active   vitamin Green-12 (CYANOCOBALAMIN) 1000 MCG tablet [id number] No Take 1,000 mcg by mouth daily. [provider] Taking Active Self  vitamin E 180 MG (400 UNITS) capsule [id number] No Take 400 Units by mouth daily. [provider] Taking Active Self            Patient Active Problem List   Diagnosis Date Noted   Statin  myopathy 06/15/2023   IDA (iron deficiency anemia) 07/06/2022   Current mild episode of  major depressive disorder without prior episode (HCC) 05/17/2022   Other chronic pain 03/05/2022   Bilateral lower extremity edema 03/05/2022   Severe aortic stenosis 01/26/2022   S/P TAVR (transcatheter aortic valve replacement) 01/26/2022   Microcytic anemia 12/10/2021   Obesity (BMI 30-39.9) 12/10/2021   Near syncope 12/09/2021   Aortic stenosis 12/09/2021   Rheumatoid arthritis (HCC) 12/09/2021   Foot drop, left foot 10/02/2021   History of colonic polyps 10/02/2021   History of rheumatic fever 10/02/2021   Stress fracture of left foot 09/03/2021   Gabapentin overdose, accidental or unintentional, initial encounter 03/08/2021   TIA (transient ischemic attack) 03/05/2021   Body mass index (BMI) 36.0-36.9, adult 10/03/2020   Subdural hemorrhage following injury without open intracranial wound and with prolonged loss of consciousness (more than 24 hours) without return to pre-existing conscious level (HCC) 10/03/2020   AKI (acute kidney injury) (HCC)    Acute encephalopathy    SDH (subdural hematoma) (HCC) 10/24/2019   Cyclic vomiting syndrome 08/06/2019   Neuropathy 12/25/2018   AK (actinic keratosis) 08/24/2018   Neoplasm of uncertain behavior 07/27/2018   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   Tendinopathy of right gluteus medius 07/13/2018   Tendinopathy of left gluteus medius 07/13/2018   Squamous cell carcinoma in situ (SCCIS) of skin 03/24/2018   Vitamin Green deficiency 11/29/2017   Stage 3 chronic kidney disease (HCC) 10/28/2017   Primary osteoarthritis of right hip 09/11/2017   Essential hypertension 07/18/2017   Hyperlipidemia associated with type 2 diabetes mellitus (HCC) 07/18/2017   Type 2 diabetes mellitus with diabetic neuropathy, unspecified (HCC) 07/18/2017   Heart murmur 07/18/2017   Hypertriglyceridemia 07/18/2017   History of stroke 07/18/2017   DDD (degenerative disc disease),  lumbar 06/15/2017   Iron deficiency anemia 06/15/2017   Stroke (HCC) 06/15/2017   Encounter for hepatitis C screening test for low risk patient 06/30/2016   Anemia 06/19/2016   Microalbuminuria due to type 2 diabetes mellitus (HCC) 03/19/2016   Hypertrophy of inferior nasal turbinate 12/20/2015   Disease of nasal cavity and sinuses 12/10/2015   Degenerative tear of acetabular labrum of left hip 11/04/2015   History of tobacco abuse 05/15/2015   Morbid (severe) obesity due to excess calories (HCC) 04/11/2015   Disorder of both eustachian tubes 01/27/2015   Maxillary sinusitis 01/13/2015   Lipoprotein deficiency 01/31/2014   Gastro-esophageal reflux disease without esophagitis 11/02/2013   Rheumatic fever 11/02/2013   Erectile dysfunction associated with type 2 diabetes mellitus (HCC) 07/31/2013   Type 2 diabetes mellitus with hyperglycemia, without long-term current use of insulin (HCC) 11/16/2012     Medication Assistance:   Patient gets [place] and Novolog thru [employer] medication assistance program - application handled by his endocrinologist office - Tommy Tommy Green.  We have applied for Kerendia medication assistance program - application pending  Assessment / Plan: DM, insulin dependent: Last A1c on file was at goal  Continue current therapy and follow up with Tommy Tommy Green.  Eye exam is UTD - last 01/06/2023 Sent app for [employer] medication assistance program at last visit - patient will take completed application to Tommy Oneita Kras office.   CKD 4 - renal function has declined Reviewed application for Tommy Green. Forwarding to PCP to review and sign if approved.  Recommended start Kerendia at 10mg  daily. Recheck potassium prior to starting and 4 weeks after initiating Tommy Green.   Medication management:  Reviewed and updated medication list Reviewed refill history and adherence  Follow Up:  Telephone follow up appointment with care management team  member scheduled for:   1  month.   Tommy Green, PharmD Clinical Pharmacist [facility]  - [facility] [phone]

## 2023-09-07 DIAGNOSIS — E860 Dehydration: Secondary | ICD-10-CM | POA: Diagnosis not present

## 2023-09-07 DIAGNOSIS — F419 Anxiety disorder, unspecified: Secondary | ICD-10-CM | POA: Diagnosis not present

## 2023-09-07 DIAGNOSIS — I959 Hypotension, unspecified: Secondary | ICD-10-CM | POA: Diagnosis not present

## 2023-09-07 DIAGNOSIS — M810 Age-related osteoporosis without current pathological fracture: Secondary | ICD-10-CM | POA: Diagnosis not present

## 2023-09-07 DIAGNOSIS — J45909 Unspecified asthma, uncomplicated: Secondary | ICD-10-CM | POA: Diagnosis not present

## 2023-09-07 DIAGNOSIS — Z79891 Long term (current) use of opiate analgesic: Secondary | ICD-10-CM | POA: Diagnosis not present

## 2023-09-07 DIAGNOSIS — Z9049 Acquired absence of other specified parts of digestive tract: Secondary | ICD-10-CM | POA: Diagnosis not present

## 2023-09-07 DIAGNOSIS — M21372 Foot drop, left foot: Secondary | ICD-10-CM | POA: Diagnosis not present

## 2023-09-07 DIAGNOSIS — Z794 Long term (current) use of insulin: Secondary | ICD-10-CM | POA: Diagnosis not present

## 2023-09-07 DIAGNOSIS — N179 Acute kidney failure, unspecified: Secondary | ICD-10-CM | POA: Diagnosis not present

## 2023-09-07 DIAGNOSIS — K7290 Hepatic failure, unspecified without coma: Secondary | ICD-10-CM | POA: Diagnosis not present

## 2023-09-07 DIAGNOSIS — Z8673 Personal history of transient ischemic attack (TIA), and cerebral infarction without residual deficits: Secondary | ICD-10-CM | POA: Diagnosis not present

## 2023-09-07 DIAGNOSIS — I442 Atrioventricular block, complete: Secondary | ICD-10-CM | POA: Diagnosis not present

## 2023-09-07 DIAGNOSIS — Z79810 Long term (current) use of selective estrogen receptor modulators (SERMs): Secondary | ICD-10-CM | POA: Diagnosis not present

## 2023-09-07 DIAGNOSIS — E119 Type 2 diabetes mellitus without complications: Secondary | ICD-10-CM | POA: Diagnosis not present

## 2023-09-07 DIAGNOSIS — I38 Endocarditis, valve unspecified: Secondary | ICD-10-CM | POA: Diagnosis not present

## 2023-09-07 DIAGNOSIS — Z48815 Encounter for surgical aftercare following surgery on the digestive system: Secondary | ICD-10-CM | POA: Diagnosis not present

## 2023-09-07 DIAGNOSIS — I10 Essential (primary) hypertension: Secondary | ICD-10-CM | POA: Diagnosis not present

## 2023-09-07 DIAGNOSIS — M1611 Unilateral primary osteoarthritis, right hip: Secondary | ICD-10-CM | POA: Diagnosis not present

## 2023-09-07 DIAGNOSIS — K219 Gastro-esophageal reflux disease without esophagitis: Secondary | ICD-10-CM | POA: Diagnosis not present

## 2023-09-07 DIAGNOSIS — K831 Obstruction of bile duct: Secondary | ICD-10-CM | POA: Diagnosis not present

## 2023-09-07 DIAGNOSIS — M069 Rheumatoid arthritis, unspecified: Secondary | ICD-10-CM | POA: Diagnosis not present

## 2023-09-07 DIAGNOSIS — D72829 Elevated white blood cell count, unspecified: Secondary | ICD-10-CM | POA: Diagnosis not present

## 2023-09-07 DIAGNOSIS — R112 Nausea with vomiting, unspecified: Secondary | ICD-10-CM | POA: Diagnosis not present

## 2023-09-07 DIAGNOSIS — D649 Anemia, unspecified: Secondary | ICD-10-CM | POA: Diagnosis not present

## 2023-09-08 DIAGNOSIS — J45909 Unspecified asthma, uncomplicated: Secondary | ICD-10-CM | POA: Diagnosis not present

## 2023-09-08 DIAGNOSIS — K7290 Hepatic failure, unspecified without coma: Secondary | ICD-10-CM | POA: Diagnosis not present

## 2023-09-08 DIAGNOSIS — M21372 Foot drop, left foot: Secondary | ICD-10-CM | POA: Diagnosis not present

## 2023-09-08 DIAGNOSIS — E119 Type 2 diabetes mellitus without complications: Secondary | ICD-10-CM | POA: Diagnosis not present

## 2023-09-08 DIAGNOSIS — R112 Nausea with vomiting, unspecified: Secondary | ICD-10-CM | POA: Diagnosis not present

## 2023-09-08 DIAGNOSIS — I10 Essential (primary) hypertension: Secondary | ICD-10-CM | POA: Diagnosis not present

## 2023-09-08 DIAGNOSIS — K831 Obstruction of bile duct: Secondary | ICD-10-CM | POA: Diagnosis not present

## 2023-09-08 DIAGNOSIS — E860 Dehydration: Secondary | ICD-10-CM | POA: Diagnosis not present

## 2023-09-08 DIAGNOSIS — I442 Atrioventricular block, complete: Secondary | ICD-10-CM | POA: Diagnosis not present

## 2023-09-08 DIAGNOSIS — M069 Rheumatoid arthritis, unspecified: Secondary | ICD-10-CM | POA: Diagnosis not present

## 2023-09-08 DIAGNOSIS — N179 Acute kidney failure, unspecified: Secondary | ICD-10-CM | POA: Diagnosis not present

## 2023-09-08 DIAGNOSIS — Z794 Long term (current) use of insulin: Secondary | ICD-10-CM | POA: Diagnosis not present

## 2023-09-08 DIAGNOSIS — Z8673 Personal history of transient ischemic attack (TIA), and cerebral infarction without residual deficits: Secondary | ICD-10-CM | POA: Diagnosis not present

## 2023-09-08 DIAGNOSIS — Z79810 Long term (current) use of selective estrogen receptor modulators (SERMs): Secondary | ICD-10-CM | POA: Diagnosis not present

## 2023-09-08 DIAGNOSIS — I959 Hypotension, unspecified: Secondary | ICD-10-CM | POA: Diagnosis not present

## 2023-09-08 DIAGNOSIS — M810 Age-related osteoporosis without current pathological fracture: Secondary | ICD-10-CM | POA: Diagnosis not present

## 2023-09-08 DIAGNOSIS — D649 Anemia, unspecified: Secondary | ICD-10-CM | POA: Diagnosis not present

## 2023-09-08 DIAGNOSIS — K219 Gastro-esophageal reflux disease without esophagitis: Secondary | ICD-10-CM | POA: Diagnosis not present

## 2023-09-08 DIAGNOSIS — F419 Anxiety disorder, unspecified: Secondary | ICD-10-CM | POA: Diagnosis not present

## 2023-09-08 DIAGNOSIS — Z48815 Encounter for surgical aftercare following surgery on the digestive system: Secondary | ICD-10-CM | POA: Diagnosis not present

## 2023-09-08 DIAGNOSIS — M1611 Unilateral primary osteoarthritis, right hip: Secondary | ICD-10-CM | POA: Diagnosis not present

## 2023-09-08 DIAGNOSIS — Z79891 Long term (current) use of opiate analgesic: Secondary | ICD-10-CM | POA: Diagnosis not present

## 2023-09-08 DIAGNOSIS — D72829 Elevated white blood cell count, unspecified: Secondary | ICD-10-CM | POA: Diagnosis not present

## 2023-09-08 DIAGNOSIS — I38 Endocarditis, valve unspecified: Secondary | ICD-10-CM | POA: Diagnosis not present

## 2023-09-12 ENCOUNTER — Other Ambulatory Visit (INDEPENDENT_AMBULATORY_CARE_PROVIDER_SITE_OTHER): Payer: Medicare HMO

## 2023-09-12 DIAGNOSIS — K831 Obstruction of bile duct: Secondary | ICD-10-CM | POA: Diagnosis not present

## 2023-09-12 DIAGNOSIS — E860 Dehydration: Secondary | ICD-10-CM | POA: Diagnosis not present

## 2023-09-12 DIAGNOSIS — I38 Endocarditis, valve unspecified: Secondary | ICD-10-CM | POA: Diagnosis not present

## 2023-09-12 DIAGNOSIS — I10 Essential (primary) hypertension: Secondary | ICD-10-CM | POA: Diagnosis not present

## 2023-09-12 DIAGNOSIS — F419 Anxiety disorder, unspecified: Secondary | ICD-10-CM | POA: Diagnosis not present

## 2023-09-12 DIAGNOSIS — K219 Gastro-esophageal reflux disease without esophagitis: Secondary | ICD-10-CM | POA: Diagnosis not present

## 2023-09-12 DIAGNOSIS — I442 Atrioventricular block, complete: Secondary | ICD-10-CM | POA: Diagnosis not present

## 2023-09-12 DIAGNOSIS — D72829 Elevated white blood cell count, unspecified: Secondary | ICD-10-CM | POA: Diagnosis not present

## 2023-09-12 DIAGNOSIS — Z8673 Personal history of transient ischemic attack (TIA), and cerebral infarction without residual deficits: Secondary | ICD-10-CM | POA: Diagnosis not present

## 2023-09-12 DIAGNOSIS — D649 Anemia, unspecified: Secondary | ICD-10-CM | POA: Diagnosis not present

## 2023-09-12 DIAGNOSIS — Z79891 Long term (current) use of opiate analgesic: Secondary | ICD-10-CM | POA: Diagnosis not present

## 2023-09-12 DIAGNOSIS — N179 Acute kidney failure, unspecified: Secondary | ICD-10-CM | POA: Diagnosis not present

## 2023-09-12 DIAGNOSIS — J45909 Unspecified asthma, uncomplicated: Secondary | ICD-10-CM | POA: Diagnosis not present

## 2023-09-12 DIAGNOSIS — M810 Age-related osteoporosis without current pathological fracture: Secondary | ICD-10-CM | POA: Diagnosis not present

## 2023-09-12 DIAGNOSIS — M21372 Foot drop, left foot: Secondary | ICD-10-CM | POA: Diagnosis not present

## 2023-09-12 DIAGNOSIS — R748 Abnormal levels of other serum enzymes: Secondary | ICD-10-CM | POA: Diagnosis not present

## 2023-09-12 DIAGNOSIS — K7290 Hepatic failure, unspecified without coma: Secondary | ICD-10-CM | POA: Diagnosis not present

## 2023-09-12 DIAGNOSIS — I959 Hypotension, unspecified: Secondary | ICD-10-CM | POA: Diagnosis not present

## 2023-09-12 DIAGNOSIS — R112 Nausea with vomiting, unspecified: Secondary | ICD-10-CM | POA: Diagnosis not present

## 2023-09-12 DIAGNOSIS — Z48815 Encounter for surgical aftercare following surgery on the digestive system: Secondary | ICD-10-CM | POA: Diagnosis not present

## 2023-09-12 DIAGNOSIS — Z79810 Long term (current) use of selective estrogen receptor modulators (SERMs): Secondary | ICD-10-CM | POA: Diagnosis not present

## 2023-09-12 DIAGNOSIS — Z794 Long term (current) use of insulin: Secondary | ICD-10-CM | POA: Diagnosis not present

## 2023-09-12 DIAGNOSIS — E119 Type 2 diabetes mellitus without complications: Secondary | ICD-10-CM | POA: Diagnosis not present

## 2023-09-12 DIAGNOSIS — M069 Rheumatoid arthritis, unspecified: Secondary | ICD-10-CM | POA: Diagnosis not present

## 2023-09-12 DIAGNOSIS — M1611 Unilateral primary osteoarthritis, right hip: Secondary | ICD-10-CM | POA: Diagnosis not present

## 2023-09-12 LAB — HEPATIC FUNCTION PANEL
ALT: 14 U/L (ref 0–53)
AST: 18 U/L (ref 0–37)
Albumin: 3.6 g/dL (ref 3.5–5.2)
Alkaline Phosphatase: 73 U/L (ref 39–117)
Bilirubin, Direct: 0.1 mg/dL (ref 0.0–0.3)
Total Bilirubin: 0.4 mg/dL (ref 0.2–1.2)
Total Protein: 6.1 g/dL (ref 6.0–8.3)

## 2023-09-20 DIAGNOSIS — C441192 Basal cell carcinoma of skin of left lower eyelid, including canthus: Secondary | ICD-10-CM | POA: Diagnosis not present

## 2023-09-21 ENCOUNTER — Telehealth: Payer: Self-pay | Admitting: Pharmacist

## 2023-09-21 DIAGNOSIS — M069 Rheumatoid arthritis, unspecified: Secondary | ICD-10-CM | POA: Diagnosis not present

## 2023-09-21 DIAGNOSIS — E1165 Type 2 diabetes mellitus with hyperglycemia: Secondary | ICD-10-CM | POA: Diagnosis not present

## 2023-09-21 DIAGNOSIS — K219 Gastro-esophageal reflux disease without esophagitis: Secondary | ICD-10-CM | POA: Diagnosis not present

## 2023-09-21 DIAGNOSIS — R112 Nausea with vomiting, unspecified: Secondary | ICD-10-CM | POA: Diagnosis not present

## 2023-09-21 DIAGNOSIS — Z79891 Long term (current) use of opiate analgesic: Secondary | ICD-10-CM | POA: Diagnosis not present

## 2023-09-21 DIAGNOSIS — Z8673 Personal history of transient ischemic attack (TIA), and cerebral infarction without residual deficits: Secondary | ICD-10-CM | POA: Diagnosis not present

## 2023-09-21 DIAGNOSIS — D649 Anemia, unspecified: Secondary | ICD-10-CM | POA: Diagnosis not present

## 2023-09-21 DIAGNOSIS — I442 Atrioventricular block, complete: Secondary | ICD-10-CM | POA: Diagnosis not present

## 2023-09-21 DIAGNOSIS — E860 Dehydration: Secondary | ICD-10-CM | POA: Diagnosis not present

## 2023-09-21 DIAGNOSIS — M1611 Unilateral primary osteoarthritis, right hip: Secondary | ICD-10-CM | POA: Diagnosis not present

## 2023-09-21 DIAGNOSIS — D72829 Elevated white blood cell count, unspecified: Secondary | ICD-10-CM | POA: Diagnosis not present

## 2023-09-21 DIAGNOSIS — J45909 Unspecified asthma, uncomplicated: Secondary | ICD-10-CM | POA: Diagnosis not present

## 2023-09-21 DIAGNOSIS — K7290 Hepatic failure, unspecified without coma: Secondary | ICD-10-CM | POA: Diagnosis not present

## 2023-09-21 DIAGNOSIS — M21372 Foot drop, left foot: Secondary | ICD-10-CM | POA: Diagnosis not present

## 2023-09-21 DIAGNOSIS — Z794 Long term (current) use of insulin: Secondary | ICD-10-CM | POA: Diagnosis not present

## 2023-09-21 DIAGNOSIS — I38 Endocarditis, valve unspecified: Secondary | ICD-10-CM | POA: Diagnosis not present

## 2023-09-21 DIAGNOSIS — Z79810 Long term (current) use of selective estrogen receptor modulators (SERMs): Secondary | ICD-10-CM | POA: Diagnosis not present

## 2023-09-21 DIAGNOSIS — K831 Obstruction of bile duct: Secondary | ICD-10-CM | POA: Diagnosis not present

## 2023-09-21 DIAGNOSIS — I959 Hypotension, unspecified: Secondary | ICD-10-CM | POA: Diagnosis not present

## 2023-09-21 DIAGNOSIS — Z48815 Encounter for surgical aftercare following surgery on the digestive system: Secondary | ICD-10-CM | POA: Diagnosis not present

## 2023-09-21 DIAGNOSIS — N179 Acute kidney failure, unspecified: Secondary | ICD-10-CM | POA: Diagnosis not present

## 2023-09-21 DIAGNOSIS — E119 Type 2 diabetes mellitus without complications: Secondary | ICD-10-CM | POA: Diagnosis not present

## 2023-09-21 DIAGNOSIS — M810 Age-related osteoporosis without current pathological fracture: Secondary | ICD-10-CM | POA: Diagnosis not present

## 2023-09-21 DIAGNOSIS — F419 Anxiety disorder, unspecified: Secondary | ICD-10-CM | POA: Diagnosis not present

## 2023-09-21 DIAGNOSIS — I10 Essential (primary) hypertension: Secondary | ICD-10-CM | POA: Diagnosis not present

## 2023-09-21 NOTE — Telephone Encounter (Signed)
Patient's wife dropped off Enbrel PAP renewal application. Provider portion placed in Sherron Ales, PA-C's folder for signature  Chesley Mires, PharmD, MPH, BCPS, CPP Clinical Pharmacist (Rheumatology and Pulmonology)

## 2023-09-22 DIAGNOSIS — E860 Dehydration: Secondary | ICD-10-CM | POA: Diagnosis not present

## 2023-09-22 DIAGNOSIS — F419 Anxiety disorder, unspecified: Secondary | ICD-10-CM | POA: Diagnosis not present

## 2023-09-22 DIAGNOSIS — M21372 Foot drop, left foot: Secondary | ICD-10-CM | POA: Diagnosis not present

## 2023-09-22 DIAGNOSIS — I959 Hypotension, unspecified: Secondary | ICD-10-CM | POA: Diagnosis not present

## 2023-09-22 DIAGNOSIS — M069 Rheumatoid arthritis, unspecified: Secondary | ICD-10-CM | POA: Diagnosis not present

## 2023-09-22 DIAGNOSIS — Z79891 Long term (current) use of opiate analgesic: Secondary | ICD-10-CM | POA: Diagnosis not present

## 2023-09-22 DIAGNOSIS — Z79810 Long term (current) use of selective estrogen receptor modulators (SERMs): Secondary | ICD-10-CM | POA: Diagnosis not present

## 2023-09-22 DIAGNOSIS — R112 Nausea with vomiting, unspecified: Secondary | ICD-10-CM | POA: Diagnosis not present

## 2023-09-22 DIAGNOSIS — Z48815 Encounter for surgical aftercare following surgery on the digestive system: Secondary | ICD-10-CM | POA: Diagnosis not present

## 2023-09-22 DIAGNOSIS — K219 Gastro-esophageal reflux disease without esophagitis: Secondary | ICD-10-CM | POA: Diagnosis not present

## 2023-09-22 DIAGNOSIS — D72829 Elevated white blood cell count, unspecified: Secondary | ICD-10-CM | POA: Diagnosis not present

## 2023-09-22 DIAGNOSIS — J45909 Unspecified asthma, uncomplicated: Secondary | ICD-10-CM | POA: Diagnosis not present

## 2023-09-22 DIAGNOSIS — M810 Age-related osteoporosis without current pathological fracture: Secondary | ICD-10-CM | POA: Diagnosis not present

## 2023-09-22 DIAGNOSIS — D649 Anemia, unspecified: Secondary | ICD-10-CM | POA: Diagnosis not present

## 2023-09-22 DIAGNOSIS — Z794 Long term (current) use of insulin: Secondary | ICD-10-CM | POA: Diagnosis not present

## 2023-09-22 DIAGNOSIS — E119 Type 2 diabetes mellitus without complications: Secondary | ICD-10-CM | POA: Diagnosis not present

## 2023-09-22 DIAGNOSIS — I442 Atrioventricular block, complete: Secondary | ICD-10-CM | POA: Diagnosis not present

## 2023-09-22 DIAGNOSIS — K7290 Hepatic failure, unspecified without coma: Secondary | ICD-10-CM | POA: Diagnosis not present

## 2023-09-22 DIAGNOSIS — I10 Essential (primary) hypertension: Secondary | ICD-10-CM | POA: Diagnosis not present

## 2023-09-22 DIAGNOSIS — I38 Endocarditis, valve unspecified: Secondary | ICD-10-CM | POA: Diagnosis not present

## 2023-09-22 DIAGNOSIS — N179 Acute kidney failure, unspecified: Secondary | ICD-10-CM | POA: Diagnosis not present

## 2023-09-22 DIAGNOSIS — Z8673 Personal history of transient ischemic attack (TIA), and cerebral infarction without residual deficits: Secondary | ICD-10-CM | POA: Diagnosis not present

## 2023-09-22 DIAGNOSIS — K831 Obstruction of bile duct: Secondary | ICD-10-CM | POA: Diagnosis not present

## 2023-09-22 DIAGNOSIS — M1611 Unilateral primary osteoarthritis, right hip: Secondary | ICD-10-CM | POA: Diagnosis not present

## 2023-09-22 NOTE — Telephone Encounter (Signed)
Received notification from [facility] regarding a prior authorization for ENBREL. Authorization has been APPROVED from 10/19/23 to 10/17/24. Approval letter sent to scan center.  Submitted Patient Assistance RENEWAL Application to [employer] for ENBREL along with provider portion, patient portion, PA, medication list, insurance card copy. Will update patient when we receive a response.  Phone #: [phone] Fax #: [phone]  Chesley Mires, PharmD, MPH, BCPS, CPP Clinical Pharmacist (Rheumatology and Pulmonology)

## 2023-09-22 NOTE — Telephone Encounter (Signed)
Received signed provider form.  Submitted a Prior Authorization request to [facility] for ENBREL via CoverMyMeds. Will update once we receive a response.  Key: [id number]   Per automated response on CMM: [facility] is not able to process this request through ePA, please contact the plan at [phone] or fax in request to [phone].  Called [employer] to initiate over the phone. Rep started over phone and states most likely will receive determination by end of day. PAP faxed to [facility] for now. Cna be submitted once PA approval is received  Case # [id number]  Chesley Mires, PharmD, MPH, BCPS, CPP Clinical Pharmacist (Rheumatology and Pulmonology)

## 2023-09-27 ENCOUNTER — Other Ambulatory Visit: Payer: Self-pay | Admitting: Physician Assistant

## 2023-09-27 DIAGNOSIS — M0579 Rheumatoid arthritis with rheumatoid factor of multiple sites without organ or systems involvement: Secondary | ICD-10-CM

## 2023-09-28 DIAGNOSIS — I38 Endocarditis, valve unspecified: Secondary | ICD-10-CM | POA: Diagnosis not present

## 2023-09-28 DIAGNOSIS — I10 Essential (primary) hypertension: Secondary | ICD-10-CM | POA: Diagnosis not present

## 2023-09-28 DIAGNOSIS — Z79810 Long term (current) use of selective estrogen receptor modulators (SERMs): Secondary | ICD-10-CM | POA: Diagnosis not present

## 2023-09-28 DIAGNOSIS — R112 Nausea with vomiting, unspecified: Secondary | ICD-10-CM | POA: Diagnosis not present

## 2023-09-28 DIAGNOSIS — K7290 Hepatic failure, unspecified without coma: Secondary | ICD-10-CM | POA: Diagnosis not present

## 2023-09-28 DIAGNOSIS — E860 Dehydration: Secondary | ICD-10-CM | POA: Diagnosis not present

## 2023-09-28 DIAGNOSIS — Z8673 Personal history of transient ischemic attack (TIA), and cerebral infarction without residual deficits: Secondary | ICD-10-CM | POA: Diagnosis not present

## 2023-09-28 DIAGNOSIS — I442 Atrioventricular block, complete: Secondary | ICD-10-CM | POA: Diagnosis not present

## 2023-09-28 DIAGNOSIS — E119 Type 2 diabetes mellitus without complications: Secondary | ICD-10-CM | POA: Diagnosis not present

## 2023-09-28 DIAGNOSIS — Z79891 Long term (current) use of opiate analgesic: Secondary | ICD-10-CM | POA: Diagnosis not present

## 2023-09-28 DIAGNOSIS — M810 Age-related osteoporosis without current pathological fracture: Secondary | ICD-10-CM | POA: Diagnosis not present

## 2023-09-28 DIAGNOSIS — Z48815 Encounter for surgical aftercare following surgery on the digestive system: Secondary | ICD-10-CM | POA: Diagnosis not present

## 2023-09-28 DIAGNOSIS — Z794 Long term (current) use of insulin: Secondary | ICD-10-CM | POA: Diagnosis not present

## 2023-09-28 DIAGNOSIS — F419 Anxiety disorder, unspecified: Secondary | ICD-10-CM | POA: Diagnosis not present

## 2023-09-28 DIAGNOSIS — N179 Acute kidney failure, unspecified: Secondary | ICD-10-CM | POA: Diagnosis not present

## 2023-09-28 DIAGNOSIS — M21372 Foot drop, left foot: Secondary | ICD-10-CM | POA: Diagnosis not present

## 2023-09-28 DIAGNOSIS — D72829 Elevated white blood cell count, unspecified: Secondary | ICD-10-CM | POA: Diagnosis not present

## 2023-09-28 DIAGNOSIS — M1611 Unilateral primary osteoarthritis, right hip: Secondary | ICD-10-CM | POA: Diagnosis not present

## 2023-09-28 DIAGNOSIS — K219 Gastro-esophageal reflux disease without esophagitis: Secondary | ICD-10-CM | POA: Diagnosis not present

## 2023-09-28 DIAGNOSIS — I959 Hypotension, unspecified: Secondary | ICD-10-CM | POA: Diagnosis not present

## 2023-09-28 DIAGNOSIS — K831 Obstruction of bile duct: Secondary | ICD-10-CM | POA: Diagnosis not present

## 2023-09-28 DIAGNOSIS — D649 Anemia, unspecified: Secondary | ICD-10-CM | POA: Diagnosis not present

## 2023-09-28 DIAGNOSIS — J45909 Unspecified asthma, uncomplicated: Secondary | ICD-10-CM | POA: Diagnosis not present

## 2023-09-28 DIAGNOSIS — M069 Rheumatoid arthritis, unspecified: Secondary | ICD-10-CM | POA: Diagnosis not present

## 2023-09-28 NOTE — Telephone Encounter (Signed)
Last Fill: 06/10/2023  Labs: 08/29/2023 Sodium 134, Glucose 213, BUN 26, Alk. Phos 144, Total Protein 5.9, Albumin 3.3, GFR 43.97, RBC 2.99, Hgb 8.4, Hct 25.7, RDW 16.0  Next Visit: 11/29/2023  Last Visit: 06/29/2023  DX: Rheumatoid arthritis involving multiple sites with positive rheumatoid factor   Current Dose per office note 06/29/2023: Arava 10 mg 1 tablet by mouth daily   Okay to refill Arava ?

## 2023-10-04 DIAGNOSIS — E860 Dehydration: Secondary | ICD-10-CM | POA: Diagnosis not present

## 2023-10-04 DIAGNOSIS — I10 Essential (primary) hypertension: Secondary | ICD-10-CM | POA: Diagnosis not present

## 2023-10-04 DIAGNOSIS — Z794 Long term (current) use of insulin: Secondary | ICD-10-CM | POA: Diagnosis not present

## 2023-10-04 DIAGNOSIS — M069 Rheumatoid arthritis, unspecified: Secondary | ICD-10-CM | POA: Diagnosis not present

## 2023-10-04 DIAGNOSIS — I38 Endocarditis, valve unspecified: Secondary | ICD-10-CM | POA: Diagnosis not present

## 2023-10-04 DIAGNOSIS — J45909 Unspecified asthma, uncomplicated: Secondary | ICD-10-CM | POA: Diagnosis not present

## 2023-10-04 DIAGNOSIS — F419 Anxiety disorder, unspecified: Secondary | ICD-10-CM | POA: Diagnosis not present

## 2023-10-04 DIAGNOSIS — K219 Gastro-esophageal reflux disease without esophagitis: Secondary | ICD-10-CM | POA: Diagnosis not present

## 2023-10-04 DIAGNOSIS — D649 Anemia, unspecified: Secondary | ICD-10-CM | POA: Diagnosis not present

## 2023-10-04 DIAGNOSIS — K7290 Hepatic failure, unspecified without coma: Secondary | ICD-10-CM | POA: Diagnosis not present

## 2023-10-04 DIAGNOSIS — Z8673 Personal history of transient ischemic attack (TIA), and cerebral infarction without residual deficits: Secondary | ICD-10-CM | POA: Diagnosis not present

## 2023-10-04 DIAGNOSIS — M810 Age-related osteoporosis without current pathological fracture: Secondary | ICD-10-CM | POA: Diagnosis not present

## 2023-10-04 DIAGNOSIS — N179 Acute kidney failure, unspecified: Secondary | ICD-10-CM | POA: Diagnosis not present

## 2023-10-04 DIAGNOSIS — I959 Hypotension, unspecified: Secondary | ICD-10-CM | POA: Diagnosis not present

## 2023-10-04 DIAGNOSIS — M21372 Foot drop, left foot: Secondary | ICD-10-CM | POA: Diagnosis not present

## 2023-10-04 DIAGNOSIS — Z79891 Long term (current) use of opiate analgesic: Secondary | ICD-10-CM | POA: Diagnosis not present

## 2023-10-04 DIAGNOSIS — R112 Nausea with vomiting, unspecified: Secondary | ICD-10-CM | POA: Diagnosis not present

## 2023-10-04 DIAGNOSIS — D72829 Elevated white blood cell count, unspecified: Secondary | ICD-10-CM | POA: Diagnosis not present

## 2023-10-04 DIAGNOSIS — Z48815 Encounter for surgical aftercare following surgery on the digestive system: Secondary | ICD-10-CM | POA: Diagnosis not present

## 2023-10-04 DIAGNOSIS — E119 Type 2 diabetes mellitus without complications: Secondary | ICD-10-CM | POA: Diagnosis not present

## 2023-10-04 DIAGNOSIS — K831 Obstruction of bile duct: Secondary | ICD-10-CM | POA: Diagnosis not present

## 2023-10-04 DIAGNOSIS — M1611 Unilateral primary osteoarthritis, right hip: Secondary | ICD-10-CM | POA: Diagnosis not present

## 2023-10-04 DIAGNOSIS — Z79810 Long term (current) use of selective estrogen receptor modulators (SERMs): Secondary | ICD-10-CM | POA: Diagnosis not present

## 2023-10-04 DIAGNOSIS — I442 Atrioventricular block, complete: Secondary | ICD-10-CM | POA: Diagnosis not present

## 2023-10-05 ENCOUNTER — Ambulatory Visit (INDEPENDENT_AMBULATORY_CARE_PROVIDER_SITE_OTHER): Payer: Medicare HMO | Admitting: Family Medicine

## 2023-10-05 ENCOUNTER — Encounter: Payer: Self-pay | Admitting: Family Medicine

## 2023-10-05 VITALS — BP 130/78 | HR 72 | Temp 98.0°F | Resp 16 | Ht 71.0 in | Wt 219.0 lb

## 2023-10-05 DIAGNOSIS — R351 Nocturia: Secondary | ICD-10-CM

## 2023-10-05 DIAGNOSIS — N401 Enlarged prostate with lower urinary tract symptoms: Secondary | ICD-10-CM | POA: Diagnosis not present

## 2023-10-05 DIAGNOSIS — F324 Major depressive disorder, single episode, in partial remission: Secondary | ICD-10-CM | POA: Diagnosis not present

## 2023-10-05 MED ORDER — LISINOPRIL 40 MG PO TABS: 40.0000 mg | ORAL_TABLET | Freq: Every morning | ORAL | 3 refills | Status: DC

## 2023-10-05 NOTE — Progress Notes (Signed)
Chief Complaint  Patient presents with   Follow-up    Follow up    Subjective Tommy Green presents for f/u depression.  He is here with his wife who helps with the history.  Pt is currently being treated with Lexapro 20 mg/d, recently increased from 10 mg daily.  Reports doing improved since treatment. No thoughts of harming self or others. No self-medication with alcohol, prescription drugs or illicit drugs. Pt is not following with a counselor/psychologist.  BPH History of BPH with nocturia.  He is currently taking Flomax 0.4 mg daily and finasteride 5 mg daily.  He reports compliance and no adverse effects.  He is still urinating more than 5 times per night.  He is not particularly steady on his feet and his wife is forced to wake up as well.  They are frustrated with the recommendations of his current urologist as they were told it could take several years for the medicine to work.  Past Medical History:  Diagnosis Date   Anemia    Essential hypertension 07/18/2017   Foot drop, left foot    due to back surgery in 01/2021   GERD (gastroesophageal reflux disease)    History of colonic polyps    History of hiatal hernia    many years ago   History of rheumatic fever    History of stroke 07/18/2017   Hypertriglyceridemia 07/18/2017   S/P TAVR (transcatheter aortic valve replacement) 01/26/2022   [name] 29mm via TF approach with Dr. Lynnette Caffey and Dr. Laneta Simmers   Severe aortic stenosis    Type 2 diabetes mellitus with diabetic neuropathy, unspecified (HCC) 07/18/2017   Allergies as of 10/05/2023       Reactions   Canagliflozin Other (See Comments)   Pt reports that the use of Invokana caused acute KIDNEY FAILURE ([facility] records) and "allergic," per [facility]   Aspirin Other (See Comments)   Gastroesophageal reflux and "allergic," per [facility]   Atorvastatin Other (See Comments)   Myalgias, GI upset, and "allergic," per [facility]   Statins Other (See Comments)   Muscle pain-  "allergic," per [facility]        Medication List        Accurate as of October 05, 2023  1:59 PM. If you have any questions, ask your nurse or doctor.          acetaminophen 325 MG tablet Commonly known as: TYLENOL Take 650 mg by mouth every 6 (six) hours as needed for mild pain or headache.   amoxicillin 500 MG tablet Commonly known as: AMOXIL Take 4 tablets (2,000 mg total) by mouth as directed. 1 HOUR PRIOR TO DENTAL APPOINTMENTS   aspirin EC 81 MG tablet Take 1 tablet (81 mg total) by mouth daily. Swallow whole.   B COMPLEX PO Take 1 tablet by mouth daily.   cyanocobalamin 1000 MCG tablet Commonly known as: VITAMIN B12 Take 1,000 mcg by mouth daily.   Enbrel Mini 50 MG/ML injection Generic drug: etanercept Inject 1 mL (50 mg total) into the skin once a week.   escitalopram 20 MG tablet Commonly known as: Lexapro Take 1 tablet (20 mg total) by mouth daily.   ezetimibe 10 MG tablet Commonly known as: ZETIA TAKE 1 TABLET DAILY FOR    ELEVATED CHOLESTEROL AND   TRIGLYCERIDES   fenofibrate micronized 200 MG capsule Commonly known as: LOFIBRA TAKE 1 CAPSULE DAILY BEFOREBREAKFAST   finasteride 5 MG tablet Commonly known as: PROSCAR Take 1 tablet (5 mg total) by  mouth daily.   fluticasone 50 MCG/ACT nasal spray Commonly known as: FLONASE Place 2 sprays into both nostrils daily.   furosemide 80 MG tablet Commonly known as: LASIX TAKE 1 TABLET BY MOUTH EVERY DAY   gabapentin 100 MG capsule Commonly known as: NEURONTIN Take 2 capsules (200 mg total) by mouth at bedtime.   HYDROcodone-acetaminophen 10-325 MG tablet Commonly known as: NORCO Take 1 tablet by mouth every 8 (eight) hours as needed (Pain).   HYDROcodone-acetaminophen 7.5-325 MG tablet Commonly known as: NORCO Take 1 tablet by mouth every 6 (six) hours as needed for moderate pain.   HYDROcodone-acetaminophen 7.5-325 MG tablet Commonly known as: Norco Take 1 tablet by mouth every 6 (six) hours  as needed for moderate pain.   leflunomide 10 MG tablet Commonly known as: ARAVA TAKE 1 TABLET DAILY   lisinopril 40 MG tablet Commonly known as: ZESTRIL Take 1 tablet (40 mg total) by mouth in the morning. Started by: [name]   magnesium oxide 400 MG tablet Commonly known as: MAG-OX Take 400 mg by mouth daily with lunch.   melatonin 5 MG Tabs Take 5 mg by mouth at bedtime.   multivitamin with minerals tablet Take 1 tablet by mouth daily with breakfast.   NovoLOG FlexPen 100 UNIT/ML FlexPen Generic drug: insulin aspart Inject 0-20 Units into the skin See admin instructions. Inject 0-10 units into the skin before meals, PER SLIDING SCALE: BGL 0-200 = give nothing, 201-250 = 2 units, 251-300 = 4 units, 301-350 = 6 units, 351-400 = 8 units, >400 = 10 units, push fluids, and repeat BGL check in 2 hours. If BGL remains >400 after 2 hours, CALL MD.   Omega 3 1000 MG Caps Take 2,000 mg by mouth daily with lunch.   omeprazole 20 MG capsule Commonly known as: PRILOSEC Take 1 capsule (20 mg total) by mouth daily.   OneTouch Verio test strip Generic drug: glucose blood 1 each by Other route as needed for other (Glucose).   Potassium 99 MG Tabs Take 99 mg by mouth every evening.   tamsulosin 0.4 MG Caps capsule Commonly known as: FLOMAX Take 1 capsule (0.4 mg total) by mouth daily. 30 min following evening meal   Tresiba FlexTouch 100 UNIT/ML FlexTouch Pen Generic drug: insulin degludec Inject 30 Units into the skin daily.   Vitamin D3 Super Strength 50 MCG (2000 UT) Caps Generic drug: Cholecalciferol Take 2,000 Units by mouth daily.   vitamin E 180 MG (400 UNITS) capsule Take 400 Units by mouth daily.        Exam BP 130/78   Pulse 72   Temp 98 F (36.7 C) (Oral)   Resp 16   Ht 5\' 11"  (1.803 m)   Wt 219 lb (99.3 kg)   SpO2 98%   BMI 30.54 kg/m  General:  well developed, well nourished, in no apparent distress Heart: RRR Lungs: CTAB. No  respiratory distress Psych: well oriented with normal range of affect and age-appropriate judgement/insight, alert and oriented x4.  Assessment and Plan  Depression, major, single episode, in partial remission (HCC)  BPH associated with nocturia - Plan: Ambulatory referral to Urology  Chronic, stable.  Continue Lexapro 20 mg daily. Chronic, unstable.  [facility].  For now, continue Flomax and finasteride. I will see him in 3 months. The patient and his spouse voiced understanding and agreement to the plan.  Jilda Roche Cantrall, DO 10/05/23 1:59 PM

## 2023-10-05 NOTE — Patient Instructions (Signed)
If you do not hear anything about your referral in the next 1-2 weeks, call our office and ask for an update.  Let us know if you need anything.  

## 2023-10-17 ENCOUNTER — Telehealth: Payer: Self-pay | Admitting: Family Medicine

## 2023-10-17 DIAGNOSIS — M1611 Unilateral primary osteoarthritis, right hip: Secondary | ICD-10-CM | POA: Diagnosis not present

## 2023-10-17 DIAGNOSIS — N179 Acute kidney failure, unspecified: Secondary | ICD-10-CM | POA: Diagnosis not present

## 2023-10-17 DIAGNOSIS — Z79810 Long term (current) use of selective estrogen receptor modulators (SERMs): Secondary | ICD-10-CM | POA: Diagnosis not present

## 2023-10-17 DIAGNOSIS — E119 Type 2 diabetes mellitus without complications: Secondary | ICD-10-CM | POA: Diagnosis not present

## 2023-10-17 DIAGNOSIS — I10 Essential (primary) hypertension: Secondary | ICD-10-CM | POA: Diagnosis not present

## 2023-10-17 DIAGNOSIS — F419 Anxiety disorder, unspecified: Secondary | ICD-10-CM | POA: Diagnosis not present

## 2023-10-17 DIAGNOSIS — D72829 Elevated white blood cell count, unspecified: Secondary | ICD-10-CM | POA: Diagnosis not present

## 2023-10-17 DIAGNOSIS — Z79891 Long term (current) use of opiate analgesic: Secondary | ICD-10-CM | POA: Diagnosis not present

## 2023-10-17 DIAGNOSIS — E860 Dehydration: Secondary | ICD-10-CM | POA: Diagnosis not present

## 2023-10-17 DIAGNOSIS — M069 Rheumatoid arthritis, unspecified: Secondary | ICD-10-CM | POA: Diagnosis not present

## 2023-10-17 DIAGNOSIS — D649 Anemia, unspecified: Secondary | ICD-10-CM | POA: Diagnosis not present

## 2023-10-17 DIAGNOSIS — I959 Hypotension, unspecified: Secondary | ICD-10-CM | POA: Diagnosis not present

## 2023-10-17 DIAGNOSIS — R112 Nausea with vomiting, unspecified: Secondary | ICD-10-CM | POA: Diagnosis not present

## 2023-10-17 DIAGNOSIS — Z48815 Encounter for surgical aftercare following surgery on the digestive system: Secondary | ICD-10-CM | POA: Diagnosis not present

## 2023-10-17 DIAGNOSIS — Z794 Long term (current) use of insulin: Secondary | ICD-10-CM | POA: Diagnosis not present

## 2023-10-17 DIAGNOSIS — K7290 Hepatic failure, unspecified without coma: Secondary | ICD-10-CM | POA: Diagnosis not present

## 2023-10-17 DIAGNOSIS — K831 Obstruction of bile duct: Secondary | ICD-10-CM | POA: Diagnosis not present

## 2023-10-17 DIAGNOSIS — I442 Atrioventricular block, complete: Secondary | ICD-10-CM | POA: Diagnosis not present

## 2023-10-17 DIAGNOSIS — M810 Age-related osteoporosis without current pathological fracture: Secondary | ICD-10-CM | POA: Diagnosis not present

## 2023-10-17 DIAGNOSIS — J45909 Unspecified asthma, uncomplicated: Secondary | ICD-10-CM | POA: Diagnosis not present

## 2023-10-17 DIAGNOSIS — M21372 Foot drop, left foot: Secondary | ICD-10-CM | POA: Diagnosis not present

## 2023-10-17 DIAGNOSIS — Z8673 Personal history of transient ischemic attack (TIA), and cerebral infarction without residual deficits: Secondary | ICD-10-CM | POA: Diagnosis not present

## 2023-10-17 DIAGNOSIS — I38 Endocarditis, valve unspecified: Secondary | ICD-10-CM | POA: Diagnosis not present

## 2023-10-17 DIAGNOSIS — K219 Gastro-esophageal reflux disease without esophagitis: Secondary | ICD-10-CM | POA: Diagnosis not present

## 2023-10-17 NOTE — Telephone Encounter (Signed)
Copied from CRM [id number]. Topic: General - Other >> Oct 17, 2023 11:11 AM Isabell A wrote: Reason for CRM: Spouse calling to confirm if patient needs an appointment to renew his handicap sticker, states the patient was seen recently & she would like to know if she can drop the paperwork off.

## 2023-10-18 NOTE — Telephone Encounter (Signed)
Called pt was advised not appt needed and we have the forms for pick up ready  at front desk.

## 2023-10-24 ENCOUNTER — Ambulatory Visit (INDEPENDENT_AMBULATORY_CARE_PROVIDER_SITE_OTHER): Payer: Medicare HMO | Admitting: Pharmacist

## 2023-10-24 DIAGNOSIS — I10 Essential (primary) hypertension: Secondary | ICD-10-CM | POA: Diagnosis not present

## 2023-10-24 DIAGNOSIS — K831 Obstruction of bile duct: Secondary | ICD-10-CM | POA: Diagnosis not present

## 2023-10-24 DIAGNOSIS — J45909 Unspecified asthma, uncomplicated: Secondary | ICD-10-CM | POA: Diagnosis not present

## 2023-10-24 DIAGNOSIS — E119 Type 2 diabetes mellitus without complications: Secondary | ICD-10-CM | POA: Diagnosis not present

## 2023-10-24 DIAGNOSIS — Z8673 Personal history of transient ischemic attack (TIA), and cerebral infarction without residual deficits: Secondary | ICD-10-CM | POA: Diagnosis not present

## 2023-10-24 DIAGNOSIS — Z794 Long term (current) use of insulin: Secondary | ICD-10-CM

## 2023-10-24 DIAGNOSIS — M810 Age-related osteoporosis without current pathological fracture: Secondary | ICD-10-CM | POA: Diagnosis not present

## 2023-10-24 DIAGNOSIS — E860 Dehydration: Secondary | ICD-10-CM | POA: Diagnosis not present

## 2023-10-24 DIAGNOSIS — E1140 Type 2 diabetes mellitus with diabetic neuropathy, unspecified: Secondary | ICD-10-CM

## 2023-10-24 DIAGNOSIS — M1611 Unilateral primary osteoarthritis, right hip: Secondary | ICD-10-CM | POA: Diagnosis not present

## 2023-10-24 DIAGNOSIS — F419 Anxiety disorder, unspecified: Secondary | ICD-10-CM | POA: Diagnosis not present

## 2023-10-24 DIAGNOSIS — I38 Endocarditis, valve unspecified: Secondary | ICD-10-CM | POA: Diagnosis not present

## 2023-10-24 DIAGNOSIS — I959 Hypotension, unspecified: Secondary | ICD-10-CM | POA: Diagnosis not present

## 2023-10-24 DIAGNOSIS — N179 Acute kidney failure, unspecified: Secondary | ICD-10-CM | POA: Diagnosis not present

## 2023-10-24 DIAGNOSIS — M069 Rheumatoid arthritis, unspecified: Secondary | ICD-10-CM | POA: Diagnosis not present

## 2023-10-24 DIAGNOSIS — Z48815 Encounter for surgical aftercare following surgery on the digestive system: Secondary | ICD-10-CM | POA: Diagnosis not present

## 2023-10-24 DIAGNOSIS — K219 Gastro-esophageal reflux disease without esophagitis: Secondary | ICD-10-CM | POA: Diagnosis not present

## 2023-10-24 DIAGNOSIS — Z79810 Long term (current) use of selective estrogen receptor modulators (SERMs): Secondary | ICD-10-CM | POA: Diagnosis not present

## 2023-10-24 DIAGNOSIS — I442 Atrioventricular block, complete: Secondary | ICD-10-CM | POA: Diagnosis not present

## 2023-10-24 DIAGNOSIS — D72829 Elevated white blood cell count, unspecified: Secondary | ICD-10-CM | POA: Diagnosis not present

## 2023-10-24 DIAGNOSIS — R112 Nausea with vomiting, unspecified: Secondary | ICD-10-CM | POA: Diagnosis not present

## 2023-10-24 DIAGNOSIS — M21372 Foot drop, left foot: Secondary | ICD-10-CM | POA: Diagnosis not present

## 2023-10-24 DIAGNOSIS — Z79891 Long term (current) use of opiate analgesic: Secondary | ICD-10-CM | POA: Diagnosis not present

## 2023-10-24 DIAGNOSIS — D649 Anemia, unspecified: Secondary | ICD-10-CM | POA: Diagnosis not present

## 2023-10-24 DIAGNOSIS — K7290 Hepatic failure, unspecified without coma: Secondary | ICD-10-CM | POA: Diagnosis not present

## 2023-10-24 NOTE — Progress Notes (Signed)
 Pharmacy Note  10/24/2023 Name: Tommy Green MRN: [id number] DOB: [birth year]  Subjective: Tommy Green is a 78 y.o. year old male who is a primary care patient of Frann Mabel Mt, DO. Clinical Pharmacist Practitioner referral was placed to assist with medication management and medication access.      CKD-4:  Current therapy: lisinopril  40mg  daily   He has tried SGLT2 in past and had acute kidney failure that was thought to be related to start of SGLT2  Creatinine had worsened but when checked 09/12/2023 Scr showed improvement.   Dr Frann had recommended starting Kerendia  in Feb 2024 but patient declined due to cost.  We submitted patient assistance program for Kerendia  but it was declined due to household income.   Lab Results  Component Value Date   NA 134 (L) 08/29/2023   K 4.5 08/29/2023   CO2 22 08/29/2023   GLUCOSE 213 (H) 08/29/2023   BUN 26 (H) 08/29/2023   CREATININE 1.50 08/29/2023   CALCIUM 8.5 08/29/2023   GFR 43.97 (L) 08/29/2023   EGFR 33 (L) 03/17/2023   GFRNONAA 36 (L) 08/17/2022     DM, insulin  dependent with CKD 4:  Patient continues to follow up with Dr Braulio at [facility]. His last appointment was 09/2023 A1c was 6.7% 05/23/2023 (unable to see most recent A1c from December 2024 appointment with Dr Braulio.   Current treatment: Tresiba  30 units daily and Novolog  per sliding scale.  Reports that he does not need Novolog  with every meal.  Using Novolog  per sliding scale 0 to 20 units based on blood glucose readings.  He checks blood glucose 3 to 4 times per day by fingerstick method. Uses Relion glucometer.  Discussed continuous Glucose Monitor in past with patient which I think would be a good option for him but he has declined.  Today his wife requests looking into Continuous Glucose Monitor.    Lab Results  Component Value Date   CREATININE 1.50 08/29/2023   CREATININE 2.13 (H) 06/15/2023   CREATININE 2.00 (H) 03/17/2023       Hypertension:  Current therapy: lisinopril  40mg  daily   Past medications: amlodipine  - stopped due to suspected they were causing dry mouth.    Denies dizziness, chest pain or headache.    BP Readings from Last 3 Encounters:  10/05/23 130/78  08/29/23 128/70  06/29/23 131/72    He is currently receiving Embrel, Tresiba  and Novolog  thru medication assistance program.   Objective: Review of patient status, including review of consultants reports, laboratory and other test data, was performed as part of comprehensive.  Lab Results  Component Value Date   CREATININE 1.50 08/29/2023   CREATININE 2.13 (H) 06/15/2023   CREATININE 2.00 (H) 03/17/2023    Lab Results  Component Value Date   HGBA1C 6.7 05/23/2023       Component Value Date/Time   CHOL 161 06/15/2023 1100   CHOL 173 06/29/2022 1456   TRIG (H) 06/15/2023 1100    611.0 Triglyceride is over 400; calculations on Lipids are invalid.   HDL 18.80 (L) 06/15/2023 1100   HDL 24 (L) 06/29/2022 1456   CHOLHDL 9 06/15/2023 1100   VLDL 63.0 (H) 05/04/2021 0852   LDLCALC 78 06/29/2022 1456   LDLDIRECT 84.0 06/15/2023 1100     Clinical ASCVD: Yes  The ASCVD Risk score (Arnett DK, et al., 2019) failed to calculate for the following reasons:   Risk score cannot be calculated because patient has a medical history  suggesting prior/existing ASCVD    BP Readings from Last 3 Encounters:  10/05/23 130/78  08/29/23 128/70  06/29/23 131/72     Allergies  Allergen Reactions   Canagliflozin Other (See Comments)    Pt reports that the use of Invokana caused acute KIDNEY FAILURE ([facility] records) and allergic, per [facility] ([facility])   Aspirin  Other (See Comments)    Gastroesophageal reflux and allergic, per [facility]    Atorvastatin Other (See Comments)    Myalgias, GI upset, and allergic, per [facility]   Statins Other (See Comments)    Muscle pain- allergic, per [facility]    Medications Reviewed Today     Reviewed by Carla Milling,  RPH-CPP (Pharmacist) on 10/24/23 at 1346  Med List Status: <None>   Medication Order Taking? Sig Documenting Provider Last Dose Status Informant  acetaminophen  (TYLENOL ) 325 MG tablet [id number] Yes Take 650 mg by mouth every 6 (six) hours as needed for mild pain or headache. [provider] Taking Active Self           Med Note JUSTINO, Varian Innes B   Wed Aug 03, 2023  3:20 PM) Reports he takes up to 4 per day  amoxicillin  (AMOXIL ) 500 MG tablet [id number] Yes Take 4 tablets (2,000 mg total) by mouth as directed. 1 HOUR PRIOR TO DENTAL APPOINTMENTS Monetta Redell PARAS, MD Taking Active   aspirin  EC 81 MG tablet [id number] Yes Take 1 tablet (81 mg total) by mouth daily. Swallow whole. Arvil Poe D, NP Taking Active   B Complex Vitamins (B COMPLEX PO) [id number] Yes Take 1 tablet by mouth daily. [provider] Taking Active Self  Cholecalciferol  (VITAMIN D3 SUPER STRENGTH) 50 MCG (2000 UT) CAPS [id number] Yes Take 2,000 Units by mouth daily. [provider] Taking Active Self  escitalopram  (LEXAPRO ) 20 MG tablet [id number] Yes Take 1 tablet (20 mg total) by mouth daily. Frann Mabel Mt, DO Taking Active   etanercept  (ENBREL  MINI) 50 MG/ML injection [id number] Yes Inject 1 mL (50 mg total) into the skin once a week. Cheryl Waddell HERO, PA-C Taking Active   ezetimibe  (ZETIA ) 10 MG tablet [id number] Yes TAKE 1 TABLET DAILY FOR    ELEVATED CHOLESTEROL AND   TRIGLYCERIDES Frann Mabel Mt, DO Taking Active   fenofibrate  micronized (LOFIBRA) 200 MG capsule [id number] Yes TAKE 1 CAPSULE DAILY BEFOREBREAKFAST Frann Mabel Mt, DO Taking Active   finasteride  (PROSCAR ) 5 MG tablet [id number] Yes Take 1 tablet (5 mg total) by mouth daily. Shona Layman BROCKS, MD Taking Active   fluticasone  (FLONASE ) 50 MCG/ACT nasal spray [id number] Yes Place 2 sprays into both nostrils daily. Frann Mabel Mt, DO Taking Active            Med Note JUSTINO, Lasaro Primm B   Wed Sep 22, 2022  9:33 AM)  Using as needed  furosemide  (LASIX ) 80 MG tablet [id number] Yes TAKE 1 TABLET BY MOUTH EVERY DAY Frann Mabel Mt, DO Taking Active            Med Note ALVIA, MARISSA C   Wed Jun 29, 2023  2:19 PM)    gabapentin  (NEURONTIN ) 100 MG capsule [id number] Yes Take 2 capsules (200 mg total) by mouth at bedtime. Frann Mabel Mt, DO Taking Active   HYDROcodone -acetaminophen  (NORCO) 10-325 MG tablet [id number] Yes Take 1 tablet by mouth every 8 (eight) hours as needed (Pain). Frann Mabel Mt, DO Taking Active   HYDROcodone -acetaminophen  (NORCO) 7.5-325 MG tablet [id number] Yes Take 1 tablet by mouth every 6 (six) hours as needed  for moderate pain. Frann Mabel Mt, DO Taking Active            Med Note JUSTINO, [place] B   Wed Aug 03, 2023  3:21 PM) Usually only takes 1 tablet at night  HYDROcodone -acetaminophen  (NORCO) 7.5-325 MG tablet [id number] Yes Take 1 tablet by mouth every 6 (six) hours as needed for moderate pain. Frann Mabel Mt, DO Taking Active   insulin  aspart (NOVOLOG  FLEXPEN) 100 UNIT/ML FlexPen [id number] Yes Inject 0-20 Units into the skin See admin instructions. Inject 0-10 units into the skin before meals, PER SLIDING SCALE: BGL 0-200 = give nothing, 201-250 = 2 units, 251-300 = 4 units, 301-350 = 6 units, 351-400 = 8 units, >400 = 10 units, push fluids, and repeat BGL check in 2 hours. If BGL remains >400 after 2 hours, CALL MD. [provider] Taking Active Self           Med Note JUSTINO, Marizol Borror B   Mon Oct 24, 2023  1:39 PM) Approved thru 10/17/2024 for Novo Nordisk medication assistance program   insulin  degludec (TRESIBA  FLEXTOUCH) 100 UNIT/ML FlexTouch Pen [id number] Yes Inject 30 Units into the skin daily. [provider] Taking Active            Med Note JUSTINO, Elester Apodaca B   Mon Oct 24, 2023  1:40 PM) Approved thru 10/17/2024 for Novo Nordisk medication assistance program   leflunomide  (ARAVA ) 10 MG tablet [id number] Yes TAKE 1 TABLET DAILY  Deveshwar, Maya, MD Taking Active   lisinopril  (ZESTRIL ) 40 MG tablet [id number] Yes Take 1 tablet (40 mg total) by mouth in the morning. Frann Mabel Mt, DO Taking Active   magnesium  oxide (MAG-OX) 400 MG tablet [id number] Yes Take 400 mg by mouth daily with lunch. [provider] Taking Active Self  melatonin 5 MG TABS [id number] Yes Take 5 mg by mouth at bedtime. [provider] Taking Active Self  Multiple Vitamins-Minerals (MULTIVITAMIN WITH MINERALS) tablet [id number] Yes Take 1 tablet by mouth daily with breakfast. [provider] Taking Active Self  Omega 3 1000 MG CAPS [id number] Yes Take 2,000 mg by mouth daily with lunch. [provider] Taking Active Self  omeprazole  (PRILOSEC) 20 MG capsule [id number] Yes Take 1 capsule (20 mg total) by mouth daily. Frann Mabel Mt, DO Taking Active   [facility] VERIO test strip [id number] Yes 1 each by Other route as needed for other (Glucose). [provider] Taking Active Self           Med Note JUSTINO, MADELIN KATHEE Schaumann Jan 06, 2023  9:32 AM) Per patient he has Relion meter  Potassium 99 MG TABS [id number] Yes Take 99 mg by mouth every evening. [provider] Taking Active Self  tamsulosin  (FLOMAX ) 0.4 MG CAPS capsule [id number] Yes Take 1 capsule (0.4 mg total) by mouth daily. 30 min following evening meal Shona Layman BROCKS, MD Taking Active   vitamin B-12 (CYANOCOBALAMIN ) 1000 MCG tablet [id number] Yes Take 1,000 mcg by mouth daily. [provider] Taking Active Self  vitamin E  180 MG (400 UNITS) capsule [id number] Yes Take 400 Units by mouth daily. [provider] Taking Active Self            Patient Active Problem List   Diagnosis Date Noted   Depression, major, single episode, in partial remission (HCC) 10/05/2023   BPH associated with nocturia 10/05/2023   Statin myopathy 06/15/2023   IDA (iron  deficiency anemia) 07/06/2022   Current mild episode of  major  depressive disorder without prior episode (HCC) 05/17/2022   Other chronic pain 03/05/2022   Bilateral lower extremity edema 03/05/2022   Severe aortic stenosis 01/26/2022   S/P TAVR (transcatheter aortic valve replacement) 01/26/2022   Microcytic anemia 12/10/2021   Obesity (BMI 30-39.9) 12/10/2021   Near syncope 12/09/2021   Aortic stenosis 12/09/2021   Rheumatoid arthritis (HCC) 12/09/2021   Foot drop, left foot 10/02/2021   History of colonic polyps 10/02/2021   History of rheumatic fever 10/02/2021   Stress fracture of left foot 09/03/2021   Gabapentin  overdose, accidental or unintentional, initial encounter 03/08/2021   TIA (transient ischemic attack) 03/05/2021   Body mass index (BMI) 36.0-36.9, adult 10/03/2020   Subdural hemorrhage following injury without open intracranial wound and with prolonged loss of consciousness (more than 24 hours) without return to pre-existing conscious level (HCC) 10/03/2020   AKI (acute kidney injury) (HCC)    Acute encephalopathy    SDH (subdural hematoma) (HCC) 10/24/2019   Cyclic vomiting syndrome 08/06/2019   Neuropathy 12/25/2018   AK (actinic keratosis) 08/24/2018   Neoplasm of uncertain behavior 07/27/2018   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   Tendinopathy of right gluteus medius 07/13/2018   Tendinopathy of left gluteus medius 07/13/2018   Squamous cell carcinoma in situ (SCCIS) of skin 03/24/2018   Vitamin D  deficiency 11/29/2017   Stage 3 chronic kidney disease (HCC) 10/28/2017   Primary osteoarthritis of right hip 09/11/2017   Essential hypertension 07/18/2017   Hyperlipidemia associated with type 2 diabetes mellitus (HCC) 07/18/2017   Type 2 diabetes mellitus with diabetic neuropathy, unspecified (HCC) 07/18/2017   Heart murmur 07/18/2017   Hypertriglyceridemia 07/18/2017   History of stroke 07/18/2017   DDD (degenerative disc disease), lumbar 06/15/2017   Iron  deficiency anemia 06/15/2017   Stroke (HCC) 06/15/2017   Encounter  for hepatitis C screening test for low risk patient 06/30/2016   Anemia 06/19/2016   Microalbuminuria due to type 2 diabetes mellitus (HCC) 03/19/2016   Hypertrophy of inferior nasal turbinate 12/20/2015   Disease of nasal cavity and sinuses 12/10/2015   Degenerative tear of acetabular labrum of left hip 11/04/2015   History of tobacco abuse 05/15/2015   Morbid (severe) obesity due to excess calories (HCC) 04/11/2015   Disorder of both eustachian tubes 01/27/2015   Maxillary sinusitis 01/13/2015   Lipoprotein deficiency 01/31/2014   Gastro-esophageal reflux disease without esophagitis 11/02/2013   Rheumatic fever 11/02/2013   Erectile dysfunction associated with type 2 diabetes mellitus (HCC) 07/31/2013   Type 2 diabetes mellitus with hyperglycemia, without long-term current use of insulin  (HCC) 11/16/2012       Assessment / Plan: DM, insulin  dependent: Last A1c on file was at goal  Continue current therapy and follow up with Dr Braulio.  Eye exam is UTD - last 01/06/2023 Will check with PCP or Endo about starting Continuous Glucose Monitor since patinet and his family is interesting to trying.   CKD 4 - renal function has improved; unable to start Kerendia  due to cost and patient was denied help from medication assistance program  Continue lisinopril  40mg  daily   Medication Assistance:   Patient to continue to work with Dr Braulio on medication assistance program for Tresiba  and Novolog  for 2025.  We applied for Kerendia  medication assistance program in 2024 but was denied.   Medication management:  Reviewed and updated medication list Reviewed refill history and adherence  Follow Up:  Telephone follow up appointment with care management team member scheduled for:  Madelin Ray, PharmD Clinical Pharmacist [facility]  - [facility] [phone]

## 2023-10-26 ENCOUNTER — Telehealth: Payer: Self-pay | Admitting: *Deleted

## 2023-10-26 NOTE — Telephone Encounter (Signed)
 Enbrel renewal application refaxed to [employer] per requested of patient's wife.  Chesley Mires, PharmD, MPH, BCPS, CPP Clinical Pharmacist (Rheumatology and Pulmonology)

## 2023-10-26 NOTE — Telephone Encounter (Signed)
 Patient's wife contacted the office on behalf of the patient to check the status of the Enbrel  patient assistance. She states she reached out to [employer] who advised her they have not received the application for [place]. She would like for it to be re-faxed and a call back to advise when it has been re-faxed. Her call back number is [phone].

## 2023-10-26 NOTE — Telephone Encounter (Signed)
 Enbrel renewal application refaxed to [employer]  Chesley Mires, PharmD, MPH, BCPS, CPP Clinical Pharmacist (Rheumatology and Pulmonology)

## 2023-11-01 ENCOUNTER — Telehealth: Payer: Self-pay | Admitting: Family Medicine

## 2023-11-01 NOTE — Progress Notes (Signed)
Pt did not answer phone for AWV.   This encounter was created in error - please disregard.

## 2023-11-01 NOTE — Telephone Encounter (Signed)
 Pt rescheduled for 11/04/23.

## 2023-11-01 NOTE — Telephone Encounter (Signed)
 Copied from CRM [id number]. Topic: General - Call Back - No Documentation >> Nov 01, 2023 12:25 PM Corean SAUNDERS wrote: Reason for CRM: Patients wife states she missed a call from Dr. Konrad nurse this morning and is requesting her to call back. I advised patients wife they missed the AWV telephone appointment and she declined to reschedule until she speaks with a nurse.

## 2023-11-03 ENCOUNTER — Other Ambulatory Visit: Payer: Self-pay | Admitting: *Deleted

## 2023-11-03 MED ORDER — ENBREL MINI 50 MG/ML ~~LOC~~ SOCT: 50.0000 mg | SUBCUTANEOUS | 0 refills | Status: AC

## 2023-11-03 NOTE — Telephone Encounter (Signed)
Refill request received via fax from [employer] for Enbrel   Last Fill: 08/01/2023  Labs: 08/29/2023 Sodium 134, Glucose 213, BUN 26, Alk. Phos 144, Total Protein 5.9, Albumin 3.3, GFR 43.97, RBC 2.99, Hgb 2.99, Hct 25.7, RDW 16.0  TB Gold: 08/16/2023 Neg    Next Visit: 11/29/2023  Last Visit: 06/29/2023  GE:[id number] arthritis involving multiple sites with positive rheumatoid factor   Current Dose per office note 06/29/2023: Enbrel mini 50 mg sq injections once weekly   Okay to refill Enbrel?

## 2023-11-04 ENCOUNTER — Ambulatory Visit (INDEPENDENT_AMBULATORY_CARE_PROVIDER_SITE_OTHER): Payer: Medicare HMO | Admitting: *Deleted

## 2023-11-04 DIAGNOSIS — Z0000 Encounter for general adult medical examination without abnormal findings: Secondary | ICD-10-CM

## 2023-11-04 NOTE — Progress Notes (Signed)
Subjective:   Tommy Green is a 78 y.o. male who presents for Medicare Annual/Subsequent preventive examination.  Visit Complete: Virtual I connected with  Tommy Green on 11/04/23 by a audio enabled telemedicine application and verified that I am speaking with the correct person using two identifiers.  Patient Location: Home  Provider Location: Office/Clinic  I discussed the limitations of evaluation and management by telemedicine. The patient expressed understanding and agreed to proceed.  Vital Signs: Because this visit was a virtual/telehealth visit, some criteria may be missing or patient reported. Any vitals not documented were not able to be obtained and vitals that have been documented are patient reported.  Cardiac Risk Factors include: advanced age (>27men, >32 women);male gender;diabetes mellitus;dyslipidemia;hypertension;obesity (BMI >30kg/m2)     Objective:    There were no vitals filed for this visit. There is no height or weight on file to calculate BMI.     11/04/2023    1:43 PM 08/18/2022   10:31 AM 08/17/2022    1:22 PM 07/06/2022    8:52 AM 01/27/2022    3:00 AM 01/26/2022    6:00 PM 01/22/2022   11:40 AM  Advanced Directives  Does Patient Have a Medical Advance Directive? Yes Yes Yes Yes Yes Yes Yes  Type of Estate agent of [place];Living will Healthcare Power of [place];Living will Healthcare Power of [place];Living will Healthcare Power of [place];Living will Healthcare Power of [place];Living will  Healthcare Power of [place];Living will  Does patient want to make changes to medical advance directive? No - Patient declined    No - Patient declined  No - Patient declined  Copy of Healthcare Power of Attorney in Chart? Yes - validated most recent copy scanned in chart (See row information) No - copy requested Yes - validated most recent copy scanned in chart (See row information) Yes - validated most recent copy scanned in  chart (See row information) Yes - validated most recent copy scanned in chart (See row information)  No - copy requested  Would patient like information on creating a medical advance directive?   No - Patient declined        Current Medications (verified) Outpatient Encounter Medications as of 11/04/2023  Medication Sig   acetaminophen (TYLENOL) 325 MG tablet Take 650 mg by mouth every 6 (six) hours as needed for mild pain or headache.   amoxicillin (AMOXIL) 500 MG tablet Take 4 tablets (2,000 mg total) by mouth as directed. 1 HOUR PRIOR TO DENTAL APPOINTMENTS   aspirin EC 81 MG tablet Take 1 tablet (81 mg total) by mouth daily. Swallow whole.   B Complex Vitamins (B COMPLEX PO) Take 1 tablet by mouth daily.   Cholecalciferol (VITAMIN D3 SUPER STRENGTH) 50 MCG (2000 UT) CAPS Take 2,000 Units by mouth daily.   escitalopram (LEXAPRO) 20 MG tablet Take 1 tablet (20 mg total) by mouth daily.   etanercept (ENBREL MINI) 50 MG/ML injection Inject 1 mL (50 mg total) into the skin once a week.   ezetimibe (ZETIA) 10 MG tablet TAKE 1 TABLET DAILY FOR    ELEVATED CHOLESTEROL AND   TRIGLYCERIDES   fenofibrate micronized (LOFIBRA) 200 MG capsule TAKE 1 CAPSULE DAILY BEFOREBREAKFAST   finasteride (PROSCAR) 5 MG tablet Take 1 tablet (5 mg total) by mouth daily.   fluticasone (FLONASE) 50 MCG/ACT nasal spray Place 2 sprays into both nostrils daily.   furosemide (LASIX) 80 MG tablet TAKE 1 TABLET BY MOUTH EVERY DAY   gabapentin (NEURONTIN) 100  MG capsule Take 2 capsules (200 mg total) by mouth at bedtime.   HYDROcodone-acetaminophen (NORCO) 10-325 MG tablet Take 1 tablet by mouth every 8 (eight) hours as needed (Pain).   HYDROcodone-acetaminophen (NORCO) 7.5-325 MG tablet Take 1 tablet by mouth every 6 (six) hours as needed for moderate pain.   HYDROcodone-acetaminophen (NORCO) 7.5-325 MG tablet Take 1 tablet by mouth every 6 (six) hours as needed for moderate pain.   insulin aspart (NOVOLOG FLEXPEN) 100  UNIT/ML FlexPen Inject 0-20 Units into the skin See admin instructions. Inject 0-10 units into the skin before meals, PER SLIDING SCALE: BGL 0-200 = give nothing, 201-250 = 2 units, 251-300 = 4 units, 301-350 = 6 units, 351-400 = 8 units, >400 = 10 units, push fluids, and repeat BGL check in 2 hours. If BGL remains >400 after 2 hours, CALL MD.   insulin degludec (TRESIBA FLEXTOUCH) 100 UNIT/ML FlexTouch Pen Inject 30 Units into the skin daily.   leflunomide (ARAVA) 10 MG tablet TAKE 1 TABLET DAILY   lisinopril (ZESTRIL) 40 MG tablet Take 1 tablet (40 mg total) by mouth in the morning.   magnesium oxide (MAG-OX) 400 MG tablet Take 400 mg by mouth daily with lunch.   melatonin 5 MG TABS Take 5 mg by mouth at bedtime.   Multiple Vitamins-Minerals (MULTIVITAMIN WITH MINERALS) tablet Take 1 tablet by mouth daily with breakfast.   Omega 3 1000 MG CAPS Take 2,000 mg by mouth daily with lunch.   omeprazole (PRILOSEC) 20 MG capsule Take 1 capsule (20 mg total) by mouth daily.   ONETOUCH VERIO test strip 1 each by Other route as needed for other (Glucose).   Potassium 99 MG TABS Take 99 mg by mouth every evening.   tamsulosin (FLOMAX) 0.4 MG CAPS capsule Take 1 capsule (0.4 mg total) by mouth daily. 30 min following evening meal   vitamin B-12 (CYANOCOBALAMIN) 1000 MCG tablet Take 1,000 mcg by mouth daily.   vitamin E 180 MG (400 UNITS) capsule Take 400 Units by mouth daily.   No facility-administered encounter medications on file as of 11/04/2023.    Allergies (verified) Canagliflozin, Aspirin, Atorvastatin, and Statins   History: Past Medical History:  Diagnosis Date   Anemia    Essential hypertension 07/18/2017   Foot drop, left foot    due to back surgery in 01/2021   GERD (gastroesophageal reflux disease)    History of colonic polyps    History of hiatal hernia    many years ago   History of rheumatic fever    History of stroke 07/18/2017   Hypertriglyceridemia 07/18/2017   S/P TAVR  (transcatheter aortic valve replacement) 01/26/2022   Edwards S3UR 29mm via TF approach with Dr. Lynnette Caffey and Dr. Laneta Simmers   Severe aortic stenosis    Type 2 diabetes mellitus with diabetic neuropathy, unspecified (HCC) 07/18/2017   Past Surgical History:  Procedure Laterality Date   COLONOSCOPY     around 2018 [facility]   COLONOSCOPY  09/02/2020   CRANIOTOMY Left 10/25/2019   Procedure: CRANIOTOMY HEMATOMA EVACUATION SUBDURAL;  Surgeon: Bedelia Person, MD;  Location: [facility] [place] OR;  Service: Neurosurgery;  Laterality: Left;   ESOPHAGOGASTRODUODENOSCOPY     around 2018 with [facility]   FOOT CAPSULOTOMY Left 07/25/2008   Mid Foot #2 MPJ   Hammertoe Repair Left 07/25/2008   #2 toe   HAND SURGERY     nodule removed from left thumb and right index finger, Dr. Judithann Sheen   INTRAOPERATIVE TRANSTHORACIC ECHOCARDIOGRAM  N/A 01/26/2022   Procedure: INTRAOPERATIVE TRANSTHORACIC ECHOCARDIOGRAM;  Surgeon: Orbie Pyo, MD;  Location[facility] OR;  Service: Open Heart Surgery;  Laterality: N/A;   MOHS SURGERY  01/25/2023   right arm   RIGHT/LEFT HEART CATH AND CORONARY ANGIOGRAPHY N/A 12/11/2021   Procedure: RIGHT/LEFT HEART CATH AND CORONARY ANGIOGRAPHY;  Surgeon: Corky Crafts, MD;  Location: [facility] INVASIVE CV LAB;  Service: Cardiovascular;  Laterality: N/A;   SPINAL FUSION  01/2021   TARSAL TUNNEL RELEASE Left 07/25/2008   TONSILLECTOMY     removed as a child   TRANSCATHETER AORTIC VALVE REPLACEMENT, TRANSFEMORAL N/A 01/26/2022   Procedure: Transcatheter Aortic Valve Replacement , Transfemoral;  Surgeon: Orbie Pyo, MD;  Location: [facility] OR;  Service: Open Heart Surgery;  Laterality: N/A;  Percutaneous   UPPER GASTROINTESTINAL ENDOSCOPY  09/02/2020   Family History  Problem Relation Age of Onset   Hyperlipidemia Mother    Colon polyps Mother    Hyperlipidemia Father    Alcoholism Brother    Healthy Son    Healthy Son    Cancer Neg Hx    Colon cancer Neg Hx    Esophageal cancer  Neg Hx    Rectal cancer Neg Hx    Stomach cancer Neg Hx    Social History   Socioeconomic History   Marital status: Married    Spouse name: Not on file   Number of children: 2   Years of education: Not on file   Highest education level: Not on file  Occupational History   Not on file  Tobacco Use   Smoking status: Former    Current packs/day: 0.00    Average packs/day: 1 pack/day for 10.0 years (10.0 ttl pk-yrs)    Types: Cigarettes    Start date: 29    Quit date: 61    Years since quitting: 52.0    Passive exposure: Past   Smokeless tobacco: Never  Vaping Use   Vaping status: Never Used  Substance and Sexual Activity   Alcohol use: Not Currently   Drug use: No   Sexual activity: Yes  Other Topics Concern   Not on file  Social History Narrative   Not on file   Social Drivers of Health   Financial Resource Strain: Low Risk  (11/04/2023)   Overall Financial Resource Strain (CARDIA)    Difficulty of Paying Living Expenses: Not hard at all  Food Insecurity: No Food Insecurity (11/04/2023)   Hunger Vital Sign    Worried About Running Out of Food in the Last Year: Never true    Ran Out of Food in the Last Year: Never true  Transportation Needs: No Transportation Needs (11/04/2023)   PRAPARE - Administrator, Civil Service (Medical): No    Lack of Transportation (Non-Medical): No  Physical Activity: Inactive (11/04/2023)   Exercise Vital Sign    Days of Exercise per Week: 0 days    Minutes of Exercise per Session: 0 min  Stress: No Stress Concern Present (11/04/2023)   [employer] of Occupational Health - Occupational Stress Questionnaire    Feeling of Stress : Not at all  Social Connections: Socially Integrated (11/04/2023)   Social Connection and Isolation Panel [NHANES]    Frequency of Communication with Friends and Family: More than three times a week    Frequency of Social Gatherings with Friends and Family: Twice a week    Attends Religious  Services: More than 4 times per year    Active  Member of Clubs or Organizations: Yes    Attends Engineer, structural: More than 4 times per year    Marital Status: Married    Tobacco Counseling Counseling given: Not Answered   Clinical Intake:  Pre-visit preparation completed: Yes  Pain : No/denies pain  Nutritional Risks: None Diabetes: Yes CBG done?: No Did pt. bring in CBG monitor from home?: No  How often do you need to have someone help you when you read instructions, pamphlets, or other written materials from your doctor or pharmacy?: 1 - Never  Interpreter Needed?: No  Information entered by :: Donne Anon, CMA   Activities of Daily Living    11/04/2023    1:46 PM  In your present state of health, do you have any difficulty performing the following activities:  Hearing? 0  Vision? 0  Difficulty concentrating or making decisions? 1  Walking or climbing stairs? 1  Dressing or bathing? 0  Doing errands, shopping? 1  Comment wife Insurance claims handler and eating ? N  Using the Toilet? N  In the past six months, have you accidently leaked urine? N  Do you have problems with loss of bowel control? N  Managing your Medications? Y  Comment wife assists  Managing your Finances? Y  Comment wife assists  Housekeeping or managing your Housekeeping? Y  Comment wife assists    Patient Care Team: [name], DO as PCP - General (Family Medicine) Dulce Sellar Iline Oven, MD as PCP - Cardiology (Cardiology) Ocie Cornfield, MD as Referring Physician (Internal Medicine) Angela Burke, OD (Optometry) Patricia Nettle, MD (Orthopedic Surgery) [name], RPH-CPP (Pharmacist)  Indicate any recent Medical Services you may have received from other than [facility] providers in the past year (date may be approximate).     Assessment:   This is a routine wellness examination for [place].  Hearing/Vision screen No results found.   Goals Addressed   None     Depression Screen    11/04/2023    1:56 PM 10/05/2023    1:39 PM 06/15/2023   10:31 AM 12/08/2022   10:58 AM 10/07/2022   12:14 PM 08/18/2022   10:29 AM 06/16/2022   11:06 AM  PHQ 2/9 Scores  PHQ - 2 Score 0 0 0 0 0 0 1  PHQ- 9 Score  0 0 2 0  4    Fall Risk    11/04/2023    1:50 PM 10/05/2023    1:39 PM 06/15/2023   10:31 AM 12/08/2022   10:58 AM 10/07/2022   12:14 PM  Fall Risk   Falls in the past year? 0 0 0 0 0  Number falls in past yr: 0 0 0 0 0  Injury with Fall? 0 0 0 0 0  Risk for fall due to : Impaired balance/gait  Impaired balance/gait Impaired mobility No Fall Risks  Follow up Falls evaluation completed Falls evaluation completed Falls evaluation completed Falls evaluation completed Falls evaluation completed    MEDICARE RISK AT HOME: Medicare Risk at Home Any stairs in or around the home?: Yes (3 steps to front door) If so, are there any without handrails?: No Home free of loose throw rugs in walkways, pet beds, electrical cords, etc?: Yes Adequate lighting in your home to reduce risk of falls?: Yes Life alert?: No Use of a cane, walker or w/c?: Yes Grab bars in the bathroom?: Yes Shower chair or bench in shower?: Yes Elevated toilet seat or a handicapped toilet?:  Yes  TIMED UP AND GO:  Was the test performed?  No    Cognitive Function:    01/05/2018    3:32 PM  MMSE - Mini Mental State Exam  Orientation to time 5  Orientation to Place 5  Registration 3  Attention/ Calculation 2  Recall 2  Language- name 2 objects 2  Language- repeat 1  Language- follow 3 step command 3  Language- read & follow direction 1  Write a sentence 1  Copy design 1  Total score 26        11/04/2023    1:57 PM 08/18/2022   10:33 AM  6CIT Screen  What Year? 0 points 0 points  What month? 0 points 0 points  What time? 0 points 0 points  Count back from 20 2 points 0 points  Months in reverse 0 points 0 points  Repeat phrase 8 points 0 points  Total Score 10  points 0 points    Immunizations Immunization History  Administered Date(s) Administered   Fluad Quad(high Dose 65+) 08/31/2021, 09/23/2022   Fluad Trivalent(High Dose 65+) 08/29/2023   Influenza, High Dose Seasonal PF 07/31/2013   Influenza-Unspecified 07/31/2013, 04/17/2014   Moderna Sars-Covid-2 Vaccination 12/11/2019, 01/10/2020, 08/08/2020   Pneumococcal Conjugate-13 08/07/2015   Pneumococcal Polysaccharide-23 04/17/2013, 07/18/2013   Td 12/25/2018   Zoster, Live 04/17/2013    TDAP status: Up to date  Flu Vaccine status: Up to date  Pneumococcal vaccine status: Up to date  Covid-19 vaccine status: Declined, Education has been provided regarding the importance of this vaccine but patient still declined. Advised may receive this vaccine at local pharmacy or Health Dept.or vaccine clinic. Aware to provide a copy of the vaccination record if obtained from local pharmacy or Health Dept. Verbalized acceptance and understanding.  Qualifies for Shingles Vaccine? Yes   Zostavax completed Yes   Shingrix Completed?: No.    Education has been provided regarding the importance of this vaccine. Patient has been advised to call insurance company to determine out of pocket expense if they have not yet received this vaccine. Advised may also receive vaccine at local pharmacy or Health Dept. Verbalized acceptance and understanding.  Screening Tests Health Maintenance  Topic Date Due   Medicare Annual Wellness (AWV)  08/19/2023   OPHTHALMOLOGY EXAM  01/13/2024   Diabetic kidney evaluation - Urine ACR  06/14/2024   Diabetic kidney evaluation - eGFR measurement  08/28/2024   Colonoscopy  09/03/2027   DTaP/Tdap/Td (2 - Tdap) 12/24/2028   Pneumonia Vaccine 21+ Years old  Completed   INFLUENZA VACCINE  Completed   Hepatitis C Screening  Completed   HPV VACCINES  Aged Out   FOOT EXAM  Discontinued   HEMOGLOBIN A1C  Discontinued   COVID-19 Vaccine  Discontinued   Zoster Vaccines- Shingrix   Discontinued    Health Maintenance  Health Maintenance Due  Topic Date Due   Medicare Annual Wellness (AWV)  08/19/2023    Colorectal cancer screening: Type of screening: Colonoscopy. Completed 09/02/20. Repeat every 7 years  Lung Cancer Screening: (Low Dose CT Chest recommended if Age 31-80 years, 20 pack-year currently smoking OR have quit w/in 15years.) does not qualify.   Additional Screening:  Hepatitis C Screening: does qualify; Completed 04/17/20  Vision Screening: Recommended annual ophthalmology exams for early detection of glaucoma and other disorders of the eye. Is the patient up to date with their annual eye exam?  Yes  Who is the provider or what is the name of the office  in which the patient attends annual eye exams? Dr. Margo Aye  If pt is not established with a provider, would they like to be referred to a provider to establish care? No .   Dental Screening: Recommended annual dental exams for proper oral hygiene  Diabetic Foot Exam: Diabetic Foot Exam: Overdue, Pt has been advised about the importance in completing this exam. Pt is scheduled for diabetic foot exam on N/a.  Community Resource Referral / Chronic Care Management: CRR required this visit?  No   CCM required this visit?  No     Plan:     I have personally reviewed and noted the following in the patient's chart:   Medical and social history Use of alcohol, tobacco or illicit drugs  Current medications and supplements including opioid prescriptions. Patient is currently taking opioid prescriptions. Information provided to patient regarding non-opioid alternatives. Patient advised to discuss non-opioid treatment plan with their provider. Functional ability and status Nutritional status Physical activity Advanced directives List of other physicians Hospitalizations, surgeries, and ER visits in previous 12 months Vitals Screenings to include cognitive, depression, and falls Referrals and  appointments  In addition, I have reviewed and discussed with patient certain preventive protocols, quality metrics, and best practice recommendations. A written personalized care plan for preventive services as well as general preventive health recommendations were provided to patient.     Donne Anon, CMA   11/04/2023   After Visit Summary: (Declined) Due to this being a telephonic visit, with patients personalized plan was offered to patient but patient Declined AVS at this time   Nurse Notes: None

## 2023-11-04 NOTE — Patient Instructions (Signed)
[name] , Thank you for taking time to come for your Medicare Wellness Visit. I appreciate your ongoing commitment to your health goals. Please review the following plan we discussed and let me know if I can assist you in the future.     This is a list of the screening recommended for you and due dates:  Health Maintenance  Topic Date Due   Eye exam for diabetics  01/13/2024   Yearly kidney health urinalysis for diabetes  06/14/2024   Yearly kidney function blood test for diabetes  08/28/2024   Medicare Annual Wellness Visit  11/03/2024   Colon Cancer Screening  09/03/2027   DTaP/Tdap/Td vaccine (2 - Tdap) 12/24/2028   Pneumonia Vaccine  Completed   Flu Shot  Completed   Hepatitis C Screening  Completed   HPV Vaccine  Aged Out   Complete foot exam   Discontinued   Hemoglobin A1C  Discontinued   COVID-19 Vaccine  Discontinued   Zoster (Shingles) Vaccine  Discontinued    Next appointment: Follow up in one year for your annual wellness visit.   Preventive Care 80 Years and Older, Male Preventive care refers to lifestyle choices and visits with your health care provider that can promote health and wellness. What does preventive care include? A yearly physical exam. This is also called an annual well check. Dental exams once or twice a year. Routine eye exams. Ask your health care provider how often you should have your eyes checked. Personal lifestyle choices, including: Daily care of your teeth and gums. Regular physical activity. Eating a healthy diet. Avoiding tobacco and drug use. Limiting alcohol use. Practicing safe sex. Taking low doses of aspirin every day. Taking vitamin and mineral supplements as recommended by your health care provider. What happens during an annual well check? The services and screenings done by your health care provider during your annual well check will depend on your age, overall health, lifestyle risk factors, and family history of  disease. Counseling  Your health care provider may ask you questions about your: Alcohol use. Tobacco use. Drug use. Emotional well-being. Home and relationship well-being. Sexual activity. Eating habits. History of falls. Memory and ability to understand (cognition). Work and work Astronomer. Screening  You may have the following tests or measurements: Height, weight, and BMI. Blood pressure. Lipid and cholesterol levels. These may be checked every 5 years, or more frequently if you are over 21 years old. Skin check. Lung cancer screening. You may have this screening every year starting at age 7 if you have a 30-pack-year history of smoking and currently smoke or have quit within the past 15 years. Fecal occult blood test (FOBT) of the stool. You may have this test every year starting at age 38. Flexible sigmoidoscopy or colonoscopy. You may have a sigmoidoscopy every 5 years or a colonoscopy every 10 years starting at age 63. Prostate cancer screening. Recommendations will vary depending on your family history and other risks. Hepatitis C blood test. Hepatitis B blood test. Sexually transmitted disease (STD) testing. Diabetes screening. This is done by checking your blood sugar (glucose) after you have not eaten for a while (fasting). You may have this done every 1-3 years. Abdominal aortic aneurysm (AAA) screening. You may need this if you are a current or former smoker. Osteoporosis. You may be screened starting at age 24 if you are at high risk. Talk with your health care provider about your test results, treatment options, and if necessary, the need for  more tests. Vaccines  Your health care provider may recommend certain vaccines, such as: Influenza vaccine. This is recommended every year. Tetanus, diphtheria, and acellular pertussis (Tdap, Td) vaccine. You may need a Td booster every 10 years. Zoster vaccine. You may need this after age 80. Pneumococcal 13-valent  conjugate (PCV13) vaccine. One dose is recommended after age 80. Pneumococcal polysaccharide (PPSV23) vaccine. One dose is recommended after age 80. Talk to your health care provider about which screenings and vaccines you need and how often you need them. This information is not intended to replace advice given to you by your health care provider. Make sure you discuss any questions you have with your health care provider. Document Released: 10/31/2015 Document Revised: 06/23/2016 Document Reviewed: 08/05/2015 Elsevier Interactive Patient Education  2017 [employer].  Fall Prevention in the Home Falls can cause injuries. They can happen to people of all ages. There are many things you can do to make your home safe and to help prevent falls. What can I do on the outside of my home? Regularly fix the edges of walkways and driveways and fix any cracks. Remove anything that might make you trip as you walk through a door, such as a raised step or threshold. Trim any bushes or trees on the path to your home. Use bright outdoor lighting. Clear any walking paths of anything that might make someone trip, such as rocks or tools. Regularly check to see if handrails are loose or broken. Make sure that both sides of any steps have handrails. Any raised decks and porches should have guardrails on the edges. Have any leaves, snow, or ice cleared regularly. Use sand or salt on walking paths during winter. Clean up any spills in your garage right away. This includes oil or grease spills. What can I do in the bathroom? Use night lights. Install grab bars by the toilet and in the tub and shower. Do not use towel bars as grab bars. Use non-skid mats or decals in the tub or shower. If you need to sit down in the shower, use a plastic, non-slip stool. Keep the floor dry. Clean up any water that spills on the floor as soon as it happens. Remove soap buildup in the tub or shower regularly. Attach bath mats  securely with double-sided non-slip rug tape. Do not have throw rugs and other things on the floor that can make you trip. What can I do in the bedroom? Use night lights. Make sure that you have a light by your bed that is easy to reach. Do not use any sheets or blankets that are too big for your bed. They should not hang down onto the floor. Have a firm chair that has side arms. You can use this for support while you get dressed. Do not have throw rugs and other things on the floor that can make you trip. What can I do in the kitchen? Clean up any spills right away. Avoid walking on wet floors. Keep items that you use a lot in easy-to-reach places. If you need to reach something above you, use a strong step stool that has a grab bar. Keep electrical cords out of the way. Do not use floor polish or wax that makes floors slippery. If you must use wax, use non-skid floor wax. Do not have throw rugs and other things on the floor that can make you trip. What can I do with my stairs? Do not leave any items on the stairs. Make  sure that there are handrails on both sides of the stairs and use them. Fix handrails that are broken or loose. Make sure that handrails are as long as the stairways. Check any carpeting to make sure that it is firmly attached to the stairs. Fix any carpet that is loose or worn. Avoid having throw rugs at the top or bottom of the stairs. If you do have throw rugs, attach them to the floor with carpet tape. Make sure that you have a light switch at the top of the stairs and the bottom of the stairs. If you do not have them, ask someone to add them for you. What else can I do to help prevent falls? Wear shoes that: Do not have high heels. Have rubber bottoms. Are comfortable and fit you well. Are closed at the toe. Do not wear sandals. If you use a stepladder: Make sure that it is fully opened. Do not climb a closed stepladder. Make sure that both sides of the stepladder  are locked into place. Ask someone to hold it for you, if possible. Clearly mark and make sure that you can see: Any grab bars or handrails. First and last steps. Where the edge of each step is. Use tools that help you move around (mobility aids) if they are needed. These include: Canes. Walkers. Scooters. Crutches. Turn on the lights when you go into a dark area. Replace any light bulbs as soon as they burn out. Set up your furniture so you have a clear path. Avoid moving your furniture around. If any of your floors are uneven, fix them. If there are any pets around you, be aware of where they are. Review your medicines with your doctor. Some medicines can make you feel dizzy. This can increase your chance of falling. Ask your doctor what other things that you can do to help prevent falls. This information is not intended to replace advice given to you by your health care provider. Make sure you discuss any questions you have with your health care provider. Document Released: 07/31/2009 Document Revised: 03/11/2016 Document Reviewed: 11/08/2014 Elsevier Interactive Patient Education  2017 [employer].

## 2023-11-07 ENCOUNTER — Other Ambulatory Visit: Payer: Self-pay | Admitting: Family Medicine

## 2023-11-08 ENCOUNTER — Other Ambulatory Visit: Payer: Self-pay | Admitting: Family Medicine

## 2023-11-08 MED ORDER — EZETIMIBE 10 MG PO TABS: 10.0000 mg | ORAL_TABLET | Freq: Every day | ORAL | 3 refills | Status: AC

## 2023-11-08 NOTE — Telephone Encounter (Signed)
Requesting: hydrocodone 10-325mg   Contract: None UDS: 06/15/23 Last Visit: 10/05/23 Next Visit: 01/03/24 Last Refill: 07/11/23 #90 and 0RF (x3)  Please Advise

## 2023-11-08 NOTE — Telephone Encounter (Signed)
Did the 10's work better? He wants mail order?

## 2023-11-08 NOTE — Telephone Encounter (Signed)
Called [employer] to check on status of application. Rep stated that PAP was approved for Enbrel until 10/17/2024. They have reached out to patient to inform them of this. A prescription was shipped on 11/02/2023. Rep was given clinic pharmacist fax number to send approval letter: [phone].  Sofie Rower, PharmD [facility] PGY-1

## 2023-11-08 NOTE — Telephone Encounter (Signed)
Copied from CRM [id number]. Topic: Clinical - Medication Refill >> Nov 08, 2023 10:13 AM Denese Killings wrote: Most Recent Primary Care Visit:  Provider: Juel Burrow  Department: [facility]  Visit Type: ANNUAL WELL VISIT, SEQUENTIAL  Date: 11/04/2023  Medication: ***  Has the patient contacted their pharmacy?  (Agent: If no, request that the patient contact the pharmacy for the refill. If patient does not wish to contact the pharmacy document the reason why and proceed with request.) (Agent: If yes, when and what did the pharmacy advise?)  Is this the correct pharmacy for this prescription?  If no, delete pharmacy and type the correct one.  This is the patient's preferred pharmacy:  [facility] [place], [place] - [address] AT Portal to Registered [employer] Sites [address] [place] [address] Phone: [phone] Fax: [phone]  [id number] [place], [place] - [address] AT [address] [place] [address] Phone: [phone] Fax: [phone]  [facility] - [place], [place] - [address] [place] [address] Phone: [phone] Fax: [phone]   Has the prescription been filled recently?   Is the patient out of the medication?   Has the patient been seen for an appointment in the last year OR does the patient have an upcoming appointment?   Can we respond through MyChart?   Agent: Please be advised that Rx refills may take up to 3 business days. We ask that you follow-up with your pharmacy.

## 2023-11-14 ENCOUNTER — Telehealth: Payer: Self-pay

## 2023-11-14 DIAGNOSIS — Z008 Encounter for other general examination: Secondary | ICD-10-CM | POA: Diagnosis not present

## 2023-11-14 NOTE — Telephone Encounter (Signed)
Copied from CRM [id number]. Topic: Clinical - Prescription Issue >> Nov 14, 2023  1:34 PM Elizebeth Brooking wrote: Reason for CRM: Patient wife called in regarding prescription HYDROcodone-acetaminophen (NORCO) 10-325 MG tablet wanted to know if the prescription would be sent to the pharmacy, she is calling asking for a status update

## 2023-11-15 ENCOUNTER — Other Ambulatory Visit: Payer: Self-pay | Admitting: Family Medicine

## 2023-11-15 MED ORDER — HYDROCODONE-ACETAMINOPHEN 7.5-325 MG PO TABS: 1.0000 | ORAL_TABLET | Freq: Four times a day (QID) | ORAL | 0 refills | Status: DC | PRN

## 2023-11-15 NOTE — Progress Notes (Deleted)
 Office Visit Note  Patient: Tommy Green             Date of Birth: [birth year]           MRN: [id number]             PCP: [name], DO Referring: [name]* Visit Date: 11/29/2023 Occupation: @GUAROCC @  Subjective:  No chief complaint on file.   History of Present Illness: Tommy Green is a 78 y.o. male ***     Activities of Daily Living:  Patient reports morning stiffness for *** {minute/hour:19697}.   Patient {ACTIONS;DENIES/REPORTS:21021675::"Denies"} nocturnal pain.  Difficulty dressing/grooming: {ACTIONS;DENIES/REPORTS:21021675::"Denies"} Difficulty climbing stairs: {ACTIONS;DENIES/REPORTS:21021675::"Denies"} Difficulty getting out of chair: {ACTIONS;DENIES/REPORTS:21021675::"Denies"} Difficulty using hands for taps, buttons, cutlery, and/or writing: {ACTIONS;DENIES/REPORTS:21021675::"Denies"}  No Rheumatology ROS completed.   PMFS History:  Patient Active Problem List   Diagnosis Date Noted   Depression, major, single episode, in partial remission (HCC) 10/05/2023   BPH associated with nocturia 10/05/2023   Statin myopathy 06/15/2023   IDA (iron deficiency anemia) 07/06/2022   Current mild episode of major depressive disorder without prior episode (HCC) 05/17/2022   Other chronic pain 03/05/2022   Bilateral lower extremity edema 03/05/2022   Severe aortic stenosis 01/26/2022   S/P TAVR (transcatheter aortic valve replacement) 01/26/2022   Microcytic anemia 12/10/2021   Obesity (BMI 30-39.9) 12/10/2021   Near syncope 12/09/2021   Aortic stenosis 12/09/2021   Rheumatoid arthritis (HCC) 12/09/2021   Foot drop, left foot 10/02/2021   History of colonic polyps 10/02/2021   History of rheumatic fever 10/02/2021   Stress fracture of left foot 09/03/2021   Gabapentin overdose, accidental or unintentional, initial encounter 03/08/2021   TIA (transient ischemic attack) 03/05/2021   Body mass index (BMI) 36.0-36.9, adult 10/03/2020    Subdural hemorrhage following injury without open intracranial wound and with prolonged loss of consciousness (more than 24 hours) without return to pre-existing conscious level (HCC) 10/03/2020   AKI (acute kidney injury) (HCC)    Acute encephalopathy    SDH (subdural hematoma) (HCC) 10/24/2019   Cyclic vomiting syndrome 08/06/2019   Neuropathy 12/25/2018   AK (actinic keratosis) 08/24/2018   Neoplasm of uncertain behavior 07/27/2018   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   Tendinopathy of right gluteus medius 07/13/2018   Tendinopathy of left gluteus medius 07/13/2018   Squamous cell carcinoma in situ (SCCIS) of skin 03/24/2018   Vitamin D deficiency 11/29/2017   Stage 3 chronic kidney disease (HCC) 10/28/2017   Primary osteoarthritis of right hip 09/11/2017   Essential hypertension 07/18/2017   Hyperlipidemia associated with type 2 diabetes mellitus (HCC) 07/18/2017   Type 2 diabetes mellitus with diabetic neuropathy, unspecified (HCC) 07/18/2017   Heart murmur 07/18/2017   Hypertriglyceridemia 07/18/2017   History of stroke 07/18/2017   DDD (degenerative disc disease), lumbar 06/15/2017   Iron deficiency anemia 06/15/2017   Stroke (HCC) 06/15/2017   Encounter for hepatitis C screening test for low risk patient 06/30/2016   Anemia 06/19/2016   Microalbuminuria due to type 2 diabetes mellitus (HCC) 03/19/2016   Hypertrophy of inferior nasal turbinate 12/20/2015   Disease of nasal cavity and sinuses 12/10/2015   Degenerative tear of acetabular labrum of left hip 11/04/2015   History of tobacco abuse 05/15/2015   Morbid (severe) obesity due to excess calories (HCC) 04/11/2015   Disorder of both eustachian tubes 01/27/2015   Maxillary sinusitis 01/13/2015   Lipoprotein deficiency 01/31/2014   Gastro-esophageal reflux disease without esophagitis 11/02/2013   Rheumatic fever 11/02/2013  Erectile dysfunction associated with type 2 diabetes mellitus (HCC) 07/31/2013   Type 2 diabetes  mellitus with hyperglycemia, without long-term current use of insulin (HCC) 11/16/2012    Past Medical History:  Diagnosis Date   Anemia    Essential hypertension 07/18/2017   Foot drop, left foot    due to back surgery in 01/2021   GERD (gastroesophageal reflux disease)    History of colonic polyps    History of hiatal hernia    many years ago   History of rheumatic fever    History of stroke 07/18/2017   Hypertriglyceridemia 07/18/2017   S/P TAVR (transcatheter aortic valve replacement) 01/26/2022   [name] 29mm via TF approach with Dr. Lynnette Caffey and Dr. Laneta Simmers   Severe aortic stenosis    Type 2 diabetes mellitus with diabetic neuropathy, unspecified (HCC) 07/18/2017    Family History  Problem Relation Age of Onset   Hyperlipidemia Mother    Colon polyps Mother    Hyperlipidemia Father    Alcoholism Brother    Healthy Son    Healthy Son    Cancer Neg Hx    Colon cancer Neg Hx    Esophageal cancer Neg Hx    Rectal cancer Neg Hx    Stomach cancer Neg Hx    Past Surgical History:  Procedure Laterality Date   COLONOSCOPY     around 2018 [facility]   COLONOSCOPY  09/02/2020   CRANIOTOMY Left 10/25/2019   Procedure: CRANIOTOMY HEMATOMA EVACUATION SUBDURAL;  Surgeon: Bedelia Person, MD;  [facility];  Service: Neurosurgery;  Laterality: Left;   ESOPHAGOGASTRODUODENOSCOPY     around 2018 with [facility]   FOOT CAPSULOTOMY Left 07/25/2008   Mid Foot #2 MPJ   Hammertoe Repair Left 07/25/2008   #2 toe   HAND SURGERY     nodule removed from left thumb and right index finger, Dr. Judithann Sheen   INTRAOPERATIVE TRANSTHORACIC ECHOCARDIOGRAM N/A 01/26/2022   Procedure: INTRAOPERATIVE TRANSTHORACIC ECHOCARDIOGRAM;  Surgeon: Orbie Pyo, MD;  Location: [facility] OR;  Service: Open Heart Surgery;  Laterality: N/A;   MOHS SURGERY  01/25/2023   right arm   RIGHT/LEFT HEART CATH AND CORONARY ANGIOGRAPHY N/A 12/11/2021   Procedure: RIGHT/LEFT HEART CATH AND CORONARY  ANGIOGRAPHY;  Surgeon: Corky Crafts, MD;  Location: [facility] INVASIVE CV LAB;  Service: Cardiovascular;  Laterality: N/A;   SPINAL FUSION  01/2021   TARSAL TUNNEL RELEASE Left 07/25/2008   TONSILLECTOMY     removed as a child   TRANSCATHETER AORTIC VALVE REPLACEMENT, TRANSFEMORAL N/A 01/26/2022   Procedure: Transcatheter Aortic Valve Replacement , Transfemoral;  Surgeon: Orbie Pyo, MD;  Location: [facility] OR;  Service: Open Heart Surgery;  Laterality: N/A;  Percutaneous   UPPER GASTROINTESTINAL ENDOSCOPY  09/02/2020   Social History   Social History Narrative   Not on file   Immunization History  Administered Date(s) Administered   Fluad Quad(high Dose 65+) 08/31/2021, 09/23/2022   Fluad Trivalent(High Dose 65+) 08/29/2023   Influenza, High Dose Seasonal PF 07/31/2013   Influenza-Unspecified 07/31/2013, 04/17/2014   Moderna Sars-Covid-2 Vaccination 12/11/2019, 01/10/2020, 08/08/2020   Pneumococcal Conjugate-13 08/07/2015   Pneumococcal Polysaccharide-23 04/17/2013, 07/18/2013   Td 12/25/2018   Zoster, Live 04/17/2013     Objective: Vital Signs: There were no vitals taken for this visit.   Physical Exam   Musculoskeletal Exam: ***  CDAI Exam: CDAI Score: -- Patient Global: --; Provider Global: -- Swollen: --; Tender: -- Joint Exam 11/29/2023   No  joint exam has been documented for this visit   There is currently no information documented on the homunculus. Go to the Rheumatology activity and complete the homunculus joint exam.  Investigation: No additional findings.  Imaging: No results found.  Recent Labs: Lab Results  Component Value Date   WBC 9.9 08/29/2023   HGB 8.4 Repeated and verified X2. (L) 08/29/2023   PLT 305.0 08/29/2023   NA 134 (L) 08/29/2023   K 4.5 08/29/2023   CL 102 08/29/2023   CO2 22 08/29/2023   GLUCOSE 213 (H) 08/29/2023   BUN 26 (H) 08/29/2023   CREATININE 1.50 08/29/2023   BILITOT 0.4 09/12/2023   ALKPHOS 73 09/12/2023    AST 18 09/12/2023   ALT 14 09/12/2023   PROT 6.1 09/12/2023   ALBUMIN 3.6 09/12/2023   CALCIUM 8.5 08/29/2023   GFRAA 48 (L) 07/15/2020   QFTBGOLDPLUS NEGATIVE 08/16/2023    Speciality Comments: Enbrel 05/2020  Procedures:  No procedures performed Allergies: Canagliflozin, Aspirin, Atorvastatin, and Statins   Assessment / Plan:     Visit Diagnoses: No diagnosis found.  Orders: No orders of the defined types were placed in this encounter.  No orders of the defined types were placed in this encounter.   Face-to-face time spent with patient was *** minutes. Greater than 50% of time was spent in counseling and coordination of care.  Follow-Up Instructions: No follow-ups on file.   Ellen Henri, CMA  Note - This record has been created using Animal nutritionist.  Chart creation errors have been sought, but may not always  have been located. Such creation errors do not reflect on  the standard of medical care.

## 2023-11-15 NOTE — Telephone Encounter (Signed)
Pt wife said it was what pharmacy had per Pt request.

## 2023-11-15 NOTE — Addendum Note (Signed)
Addended by: Radene Gunning on: 11/15/2023 03:29 PM   Modules accepted: Orders

## 2023-11-16 ENCOUNTER — Telehealth: Payer: Self-pay | Admitting: Family Medicine

## 2023-11-16 NOTE — Telephone Encounter (Signed)
  Chief Complaint: medication question  Additional Notes: Spoke with wife Elease Hashimoto and advised [facility] and should be available for pick-up. Wife verbalized understanding.      Copied from CRM [id number]. Topic: Clinical - Prescription Issue >> Nov 16, 2023 12:03 PM Dennison Nancy wrote: Reason for CRM: Patient spouse Elease Hashimoto calling on status of the HYDROcodone-acetaminophen [facility])  [id number] [place], [place] - [address], [place] [address] Phone: [phone]  Fax: [phone]

## 2023-11-19 ENCOUNTER — Other Ambulatory Visit: Payer: Self-pay | Admitting: Urology

## 2023-11-29 ENCOUNTER — Ambulatory Visit: Payer: Medicare HMO | Admitting: Rheumatology

## 2023-11-29 DIAGNOSIS — M0579 Rheumatoid arthritis with rheumatoid factor of multiple sites without organ or systems involvement: Secondary | ICD-10-CM

## 2023-11-29 DIAGNOSIS — L920 Granuloma annulare: Secondary | ICD-10-CM

## 2023-11-29 DIAGNOSIS — G8929 Other chronic pain: Secondary | ICD-10-CM

## 2023-11-29 DIAGNOSIS — M24521 Contracture, right elbow: Secondary | ICD-10-CM

## 2023-11-29 DIAGNOSIS — S065XAA Traumatic subdural hemorrhage with loss of consciousness status unknown, initial encounter: Secondary | ICD-10-CM

## 2023-11-29 DIAGNOSIS — Z8673 Personal history of transient ischemic attack (TIA), and cerebral infarction without residual deficits: Secondary | ICD-10-CM

## 2023-11-29 DIAGNOSIS — R011 Cardiac murmur, unspecified: Secondary | ICD-10-CM

## 2023-11-29 DIAGNOSIS — Z952 Presence of prosthetic heart valve: Secondary | ICD-10-CM

## 2023-11-29 DIAGNOSIS — D049 Carcinoma in situ of skin, unspecified: Secondary | ICD-10-CM

## 2023-11-29 DIAGNOSIS — I10 Essential (primary) hypertension: Secondary | ICD-10-CM

## 2023-11-29 DIAGNOSIS — M0630 Rheumatoid nodule, unspecified site: Secondary | ICD-10-CM

## 2023-11-29 DIAGNOSIS — M1712 Unilateral primary osteoarthritis, left knee: Secondary | ICD-10-CM

## 2023-11-29 DIAGNOSIS — Z79899 Other long term (current) drug therapy: Secondary | ICD-10-CM

## 2023-11-29 DIAGNOSIS — M47816 Spondylosis without myelopathy or radiculopathy, lumbar region: Secondary | ICD-10-CM

## 2023-11-29 DIAGNOSIS — E1169 Type 2 diabetes mellitus with other specified complication: Secondary | ICD-10-CM

## 2023-11-29 DIAGNOSIS — M25572 Pain in left ankle and joints of left foot: Secondary | ICD-10-CM

## 2023-11-29 DIAGNOSIS — M160 Bilateral primary osteoarthritis of hip: Secondary | ICD-10-CM

## 2023-11-29 DIAGNOSIS — E1140 Type 2 diabetes mellitus with diabetic neuropathy, unspecified: Secondary | ICD-10-CM

## 2023-12-05 DIAGNOSIS — R350 Frequency of micturition: Secondary | ICD-10-CM | POA: Diagnosis not present

## 2023-12-05 DIAGNOSIS — N401 Enlarged prostate with lower urinary tract symptoms: Secondary | ICD-10-CM | POA: Diagnosis not present

## 2023-12-05 DIAGNOSIS — R351 Nocturia: Secondary | ICD-10-CM | POA: Diagnosis not present

## 2023-12-05 DIAGNOSIS — R3912 Poor urinary stream: Secondary | ICD-10-CM | POA: Diagnosis not present

## 2023-12-08 ENCOUNTER — Ambulatory Visit: Payer: Medicare HMO | Admitting: Urology

## 2023-12-13 NOTE — Progress Notes (Deleted)
 Office Visit Note  Patient: Tommy Green             Date of Birth: [birth year]           MRN: [id number]             PCP: [name], DO Referring: [name]* Visit Date: 12/27/2023 Occupation: @GUAROCC @  Subjective:  No chief complaint on file.   History of Present Illness: Arshad Oberholzer Youngers is a 78 y.o. male ***     Activities of Daily Living:  Patient reports morning stiffness for *** {minute/hour:19697}.   Patient {ACTIONS;DENIES/REPORTS:21021675::"Denies"} nocturnal pain.  Difficulty dressing/grooming: {ACTIONS;DENIES/REPORTS:21021675::"Denies"} Difficulty climbing stairs: {ACTIONS;DENIES/REPORTS:21021675::"Denies"} Difficulty getting out of chair: {ACTIONS;DENIES/REPORTS:21021675::"Denies"} Difficulty using hands for taps, buttons, cutlery, and/or writing: {ACTIONS;DENIES/REPORTS:21021675::"Denies"}  No Rheumatology ROS completed.   PMFS History:  Patient Active Problem List   Diagnosis Date Noted  . Depression, major, single episode, in partial remission (HCC) 10/05/2023  . BPH associated with nocturia 10/05/2023  . Statin myopathy 06/15/2023  . IDA (iron deficiency anemia) 07/06/2022  . Current mild episode of major depressive disorder without prior episode (HCC) 05/17/2022  . Other chronic pain 03/05/2022  . Bilateral lower extremity edema 03/05/2022  . Severe aortic stenosis 01/26/2022  . S/P TAVR (transcatheter aortic valve replacement) 01/26/2022  . Microcytic anemia 12/10/2021  . Obesity (BMI 30-39.9) 12/10/2021  . Near syncope 12/09/2021  . Aortic stenosis 12/09/2021  . Rheumatoid arthritis (HCC) 12/09/2021  . Foot drop, left foot 10/02/2021  . History of colonic polyps 10/02/2021  . History of rheumatic fever 10/02/2021  . Stress fracture of left foot 09/03/2021  . Gabapentin overdose, accidental or unintentional, initial encounter 03/08/2021  . TIA (transient ischemic attack) 03/05/2021  . Body mass index (BMI)  36.0-36.9, adult 10/03/2020  . Subdural hemorrhage following injury without open intracranial wound and with prolonged loss of consciousness (more than 24 hours) without return to pre-existing conscious level (HCC) 10/03/2020  . AKI (acute kidney injury) (HCC)   . Acute encephalopathy   . SDH (subdural hematoma) (HCC) 10/24/2019  . Cyclic vomiting syndrome 08/06/2019  . Neuropathy 12/25/2018  . AK (actinic keratosis) 08/24/2018  . Neoplasm of uncertain behavior 07/27/2018  . 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 Tendinopathy of right gluteus medius 07/13/2018  . Tendinopathy of left gluteus medius 07/13/2018  . Squamous cell carcinoma in situ (SCCIS) of skin 03/24/2018  . Vitamin D deficiency 11/29/2017  . Stage 3 chronic kidney disease (HCC) 10/28/2017  . Primary osteoarthritis of right hip 09/11/2017  . Essential hypertension 07/18/2017  . Hyperlipidemia associated with type 2 diabetes mellitus (HCC) 07/18/2017  . Type 2 diabetes mellitus with diabetic neuropathy, unspecified (HCC) 07/18/2017  . Heart murmur 07/18/2017  . Hypertriglyceridemia 07/18/2017  . History of stroke 07/18/2017  . DDD (degenerative disc disease), lumbar 06/15/2017  . Iron deficiency anemia 06/15/2017  . Stroke (HCC) 06/15/2017  . Encounter for hepatitis C screening test for low risk patient 06/30/2016  . Anemia 06/19/2016  . Microalbuminuria due to type 2 diabetes mellitus (HCC) 03/19/2016  . Hypertrophy of inferior nasal turbinate 12/20/2015  . Disease of nasal cavity and sinuses 12/10/2015  . Degenerative tear of acetabular labrum of left hip 11/04/2015  . History of tobacco abuse 05/15/2015  . Morbid (severe) obesity due to excess calories (HCC) 04/11/2015  . Disorder of both eustachian tubes 01/27/2015  . Maxillary sinusitis 01/13/2015  . Lipoprotein deficiency 01/31/2014  . Gastro-esophageal reflux disease without esophagitis 11/02/2013  . Rheumatic fever 11/02/2013  .  Erectile dysfunction associated  with type 2 diabetes mellitus (HCC) 07/31/2013  . Type 2 diabetes mellitus with hyperglycemia, without long-term current use of insulin (HCC) 11/16/2012    Past Medical History:  Diagnosis Date  . Anemia   . Essential hypertension 07/18/2017  . Foot drop, left foot    due to back surgery in 01/2021  . GERD (gastroesophageal reflux disease)   . History of colonic polyps   . History of hiatal hernia    many years ago  . History of rheumatic fever   . History of stroke 07/18/2017  . Hypertriglyceridemia 07/18/2017  . S/P TAVR (transcatheter aortic valve replacement) 01/26/2022   Edwards S3UR 29mm via TF approach with Dr. Lynnette Caffey and Dr. Laneta Simmers  . Severe aortic stenosis   . Type 2 diabetes mellitus with diabetic neuropathy, unspecified (HCC) 07/18/2017    Family History  Problem Relation Age of Onset  . Hyperlipidemia Mother   . Colon polyps Mother   . Hyperlipidemia Father   . Alcoholism Brother   . Healthy Son   . Healthy Son   . Cancer Neg Hx   . Colon cancer Neg Hx   . Esophageal cancer Neg Hx   . Rectal cancer Neg Hx   . Stomach cancer Neg Hx    Past Surgical History:  Procedure Laterality Date  . COLONOSCOPY     around 2018 [facility]  . COLONOSCOPY  09/02/2020  . CRANIOTOMY Left 10/25/2019   Procedure: CRANIOTOMY HEMATOMA EVACUATION SUBDURAL;  Surgeon: Bedelia Person, MD;  Location: [facility] OR;  Service: Neurosurgery;  Laterality: Left;  . ESOPHAGOGASTRODUODENOSCOPY     around 2018 with [facility]  . FOOT CAPSULOTOMY Left 07/25/2008   Mid Foot #2 MPJ  . Hammertoe Repair Left 07/25/2008   #2 toe  . HAND SURGERY     nodule removed from left thumb and right index finger, Dr. Judithann Sheen  . INTRAOPERATIVE TRANSTHORACIC ECHOCARDIOGRAM N/A 01/26/2022   Procedure: INTRAOPERATIVE TRANSTHORACIC ECHOCARDIOGRAM;  Surgeon: Orbie Pyo, MD;  Location: [facility] OR;  Service: Open Heart Surgery;  Laterality: N/A;  . MOHS SURGERY  01/25/2023   right arm  . RIGHT/LEFT HEART  CATH AND CORONARY ANGIOGRAPHY N/A 12/11/2021   Procedure: RIGHT/LEFT HEART CATH AND CORONARY ANGIOGRAPHY;  Surgeon: Corky Crafts, MD;  Location: [facility] INVASIVE CV LAB;  Service: Cardiovascular;  Laterality: N/A;  . SPINAL FUSION  01/2021  . TARSAL TUNNEL RELEASE Left 07/25/2008  . TONSILLECTOMY     removed as a child  . TRANSCATHETER AORTIC VALVE REPLACEMENT, TRANSFEMORAL N/A 01/26/2022   Procedure: Transcatheter Aortic Valve Replacement , Transfemoral;  Surgeon: Orbie Pyo, MD;  Location: [facility] OR;  Service: Open Heart Surgery;  Laterality: N/A;  Percutaneous  . UPPER GASTROINTESTINAL ENDOSCOPY  09/02/2020   Social History   Social History Narrative  . Not on file   Immunization History  Administered Date(s) Administered  . Fluad Quad(high Dose 65+) 08/31/2021, 09/23/2022  . Fluad Trivalent(High Dose 65+) 08/29/2023  . Influenza, High Dose Seasonal PF 07/31/2013  . Influenza-Unspecified 07/31/2013, 04/17/2014  . Moderna Sars-Covid-2 Vaccination 12/11/2019, 01/10/2020, 08/08/2020  . Pneumococcal Conjugate-13 08/07/2015  . Pneumococcal Polysaccharide-23 04/17/2013, 07/18/2013  . Td 12/25/2018  . Zoster, Live 04/17/2013     Objective: Vital Signs: There were no vitals taken for this visit.   Physical Exam   Musculoskeletal Exam: ***  CDAI Exam: CDAI Score: -- Patient Global: --; Provider Global: -- Swollen: --; Tender: -- Joint Exam 12/27/2023   No  joint exam has been documented for this visit   There is currently no information documented on the homunculus. Go to the Rheumatology activity and complete the homunculus joint exam.  Investigation: No additional findings.  Imaging: No results found.  Recent Labs: Lab Results  Component Value Date   WBC 9.9 08/29/2023   HGB 8.4 Repeated and verified X2. (L) 08/29/2023   PLT 305.0 08/29/2023   NA 134 (L) 08/29/2023   K 4.5 08/29/2023   CL 102 08/29/2023   CO2 22 08/29/2023   GLUCOSE 213 (H) 08/29/2023    BUN 26 (H) 08/29/2023   CREATININE 1.50 08/29/2023   BILITOT 0.4 09/12/2023   ALKPHOS 73 09/12/2023   AST 18 09/12/2023   ALT 14 09/12/2023   PROT 6.1 09/12/2023   ALBUMIN 3.6 09/12/2023   CALCIUM 8.5 08/29/2023   GFRAA 48 (L) 07/15/2020   QFTBGOLDPLUS NEGATIVE 08/16/2023    Speciality Comments: Enbrel 05/2020  Procedures:  No procedures performed Allergies: Canagliflozin, Aspirin, Atorvastatin, and Statins   Assessment / Plan:     Visit Diagnoses: No diagnosis found.  Orders: No orders of the defined types were placed in this encounter.  No orders of the defined types were placed in this encounter.   Face-to-face time spent with patient was *** minutes. Greater than 50% of time was spent in counseling and coordination of care.  Follow-Up Instructions: No follow-ups on file.   Ellen Henri, CMA  Note - This record has been created using Animal nutritionist.  Chart creation errors have been sought, but may not always  have been located. Such creation errors do not reflect on  the standard of medical care.

## 2023-12-27 ENCOUNTER — Ambulatory Visit: Payer: Medicare HMO | Admitting: Rheumatology

## 2023-12-27 DIAGNOSIS — D049 Carcinoma in situ of skin, unspecified: Secondary | ICD-10-CM

## 2023-12-27 DIAGNOSIS — M0630 Rheumatoid nodule, unspecified site: Secondary | ICD-10-CM

## 2023-12-27 DIAGNOSIS — R011 Cardiac murmur, unspecified: Secondary | ICD-10-CM

## 2023-12-27 DIAGNOSIS — Z794 Long term (current) use of insulin: Secondary | ICD-10-CM

## 2023-12-27 DIAGNOSIS — M160 Bilateral primary osteoarthritis of hip: Secondary | ICD-10-CM

## 2023-12-27 DIAGNOSIS — S065XAA Traumatic subdural hemorrhage with loss of consciousness status unknown, initial encounter: Secondary | ICD-10-CM

## 2023-12-27 DIAGNOSIS — E1169 Type 2 diabetes mellitus with other specified complication: Secondary | ICD-10-CM

## 2023-12-27 DIAGNOSIS — M1712 Unilateral primary osteoarthritis, left knee: Secondary | ICD-10-CM

## 2023-12-27 DIAGNOSIS — L920 Granuloma annulare: Secondary | ICD-10-CM

## 2023-12-27 DIAGNOSIS — I10 Essential (primary) hypertension: Secondary | ICD-10-CM

## 2023-12-27 DIAGNOSIS — Z952 Presence of prosthetic heart valve: Secondary | ICD-10-CM

## 2023-12-27 DIAGNOSIS — Z79899 Other long term (current) drug therapy: Secondary | ICD-10-CM

## 2023-12-27 DIAGNOSIS — G8929 Other chronic pain: Secondary | ICD-10-CM

## 2023-12-27 DIAGNOSIS — Z8673 Personal history of transient ischemic attack (TIA), and cerebral infarction without residual deficits: Secondary | ICD-10-CM

## 2023-12-27 DIAGNOSIS — M24521 Contracture, right elbow: Secondary | ICD-10-CM

## 2023-12-27 DIAGNOSIS — M0579 Rheumatoid arthritis with rheumatoid factor of multiple sites without organ or systems involvement: Secondary | ICD-10-CM

## 2023-12-27 DIAGNOSIS — M47816 Spondylosis without myelopathy or radiculopathy, lumbar region: Secondary | ICD-10-CM

## 2023-12-27 DIAGNOSIS — M25572 Pain in left ankle and joints of left foot: Secondary | ICD-10-CM

## 2024-01-03 ENCOUNTER — Ambulatory Visit (INDEPENDENT_AMBULATORY_CARE_PROVIDER_SITE_OTHER): Payer: Medicare HMO | Admitting: Family Medicine

## 2024-01-03 ENCOUNTER — Encounter: Payer: Self-pay | Admitting: Family Medicine

## 2024-01-03 VITALS — BP 134/76 | HR 86 | Temp 97.9°F | Ht 71.0 in | Wt 213.0 lb

## 2024-01-03 DIAGNOSIS — I10 Essential (primary) hypertension: Secondary | ICD-10-CM | POA: Diagnosis not present

## 2024-01-03 DIAGNOSIS — G8929 Other chronic pain: Secondary | ICD-10-CM | POA: Diagnosis not present

## 2024-01-03 MED ORDER — TAMSULOSIN HCL 0.4 MG PO CAPS: 0.4000 mg | ORAL_CAPSULE | Freq: Every day | ORAL | Status: DC

## 2024-01-03 NOTE — Progress Notes (Signed)
 Chief Complaint  Patient presents with   Follow-up    Patient presents today for a 3 month follow-up    Subjective [name] is a 78 y.o. male who presents for hypertension follow up. He does monitor home blood pressures. Blood pressures ranging from 130's/70's on average. He is compliant with medication-lisinopril 40 mg daily. Patient has these side effects of medication: none He is sometimes adhering to a healthy diet overall. Current exercise: none No CP or SOB.   Patient has a history of chronic pain.  He is currently taking hydrocodone 7.5-325 mg 3 times daily as needed.  He reports compliance and no adverse effects.  He is doing well controlling his pain.   Past Medical History:  Diagnosis Date   Anemia    Essential hypertension 07/18/2017   Foot drop, left foot    due to back surgery in 01/2021   GERD (gastroesophageal reflux disease)    History of colonic polyps    History of hiatal hernia    many years ago   History of rheumatic fever    History of stroke 07/18/2017   Hypertriglyceridemia 07/18/2017   S/P TAVR (transcatheter aortic valve replacement) 01/26/2022   [name] 29mm via TF approach with Dr. Lynnette Caffey and Dr. Laneta Simmers   Severe aortic stenosis    Type 2 diabetes mellitus with diabetic neuropathy, unspecified (HCC) 07/18/2017    Exam BP 134/76   Pulse 86   Temp 97.9 F (36.6 C)   Ht 5\' 11"  (1.803 m)   Wt 213 lb (96.6 kg)   SpO2 97%   BMI 29.71 kg/m  General:  well developed, well nourished, in no apparent distress Heart: RRR, no bruits Lungs: clear to auscultation, no accessory muscle use Psych: well oriented with normal range of affect and appropriate judgment/insight  Essential hypertension  Other chronic pain  Chronic, stable.  Continue lisinopril 40 mg daily.  Counseled on diet and exercise. Chronic, stable.  Continue hydrocodone 7.5 mg 3 times daily as needed. F/u in 2 weeks to recheck his urination.  We would likely take over  his frequency.  If no change, we will stop his Flomax and start Ditropan. The patient voiced understanding and agreement to the plan.  [name], DO 01/03/24  2:53 PM

## 2024-01-03 NOTE — Patient Instructions (Addendum)
 Go to 1 tamsulosin daily for 2 weeks. If we notice no change, stop.   Keep the diet clean and stay active.  Let us know if you need anything.

## 2024-01-06 NOTE — Progress Notes (Deleted)
 Office Visit Note  Patient: Tommy Green             Date of Birth: 31-May-1946           MRN: [id number]             PCP: [name], DO Referring: [name]* Visit Date: 01/20/2024 Occupation: @GUAROCC @  Subjective:  No chief complaint on file.   History of Present Illness: Tommy Green is a 78 y.o. male ***     Activities of Daily Living:  Patient reports morning stiffness for *** {minute/hour:19697}.   Patient {ACTIONS;DENIES/REPORTS:21021675::"Denies"} nocturnal pain.  Difficulty dressing/grooming: {ACTIONS;DENIES/REPORTS:21021675::"Denies"} Difficulty climbing stairs: {ACTIONS;DENIES/REPORTS:21021675::"Denies"} Difficulty getting out of chair: {ACTIONS;DENIES/REPORTS:21021675::"Denies"} Difficulty using hands for taps, buttons, cutlery, and/or writing: {ACTIONS;DENIES/REPORTS:21021675::"Denies"}  No Rheumatology ROS completed.   PMFS History:  Patient Active Problem List   Diagnosis Date Noted  . Depression, major, single episode, in partial remission (HCC) 10/05/2023  . BPH associated with nocturia 10/05/2023  . Statin myopathy 06/15/2023  . IDA (iron deficiency anemia) 07/06/2022  . Current mild episode of major depressive disorder without prior episode (HCC) 05/17/2022  . Other chronic pain 03/05/2022  . Bilateral lower extremity edema 03/05/2022  . Severe aortic stenosis 01/26/2022  . S/P TAVR (transcatheter aortic valve replacement) 01/26/2022  . Microcytic anemia 12/10/2021  . Obesity (BMI 30-39.9) 12/10/2021  . Near syncope 12/09/2021  . Aortic stenosis 12/09/2021  . Rheumatoid arthritis (HCC) 12/09/2021  . Foot drop, left foot 10/02/2021  . History of colonic polyps 10/02/2021  . History of rheumatic fever 10/02/2021  . Stress fracture of left foot 09/03/2021  . Gabapentin overdose, accidental or unintentional, initial encounter 03/08/2021  . TIA (transient ischemic attack) 03/05/2021  . Body mass index (BMI)  36.0-36.9, adult 10/03/2020  . Subdural hemorrhage following injury without open intracranial wound and with prolonged loss of consciousness (more than 24 hours) without return to pre-existing conscious level (HCC) 10/03/2020  . AKI (acute kidney injury) (HCC)   . Acute encephalopathy   . SDH (subdural hematoma) (HCC) 10/24/2019  . Cyclic vomiting syndrome 08/06/2019  . Neuropathy 12/25/2018  . AK (actinic keratosis) 08/24/2018  . Neoplasm of uncertain behavior 07/27/2018  . 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 Tendinopathy of right gluteus medius 07/13/2018  . Tendinopathy of left gluteus medius 07/13/2018  . Squamous cell carcinoma in situ (SCCIS) of skin 03/24/2018  . Vitamin D deficiency 11/29/2017  . Stage 3 chronic kidney disease (HCC) 10/28/2017  . Primary osteoarthritis of right hip 09/11/2017  . Essential hypertension 07/18/2017  . Hyperlipidemia associated with type 2 diabetes mellitus (HCC) 07/18/2017  . Type 2 diabetes mellitus with diabetic neuropathy, unspecified (HCC) 07/18/2017  . Heart murmur 07/18/2017  . Hypertriglyceridemia 07/18/2017  . History of stroke 07/18/2017  . DDD (degenerative disc disease), lumbar 06/15/2017  . Iron deficiency anemia 06/15/2017  . Stroke (HCC) 06/15/2017  . Encounter for hepatitis C screening test for low risk patient 06/30/2016  . Anemia 06/19/2016  . Microalbuminuria due to type 2 diabetes mellitus (HCC) 03/19/2016  . Hypertrophy of inferior nasal turbinate 12/20/2015  . Disease of nasal cavity and sinuses 12/10/2015  . Degenerative tear of acetabular labrum of left hip 11/04/2015  . History of tobacco abuse 05/15/2015  . Morbid (severe) obesity due to excess calories (HCC) 04/11/2015  . Disorder of both eustachian tubes 01/27/2015  . Maxillary sinusitis 01/13/2015  . Lipoprotein deficiency 01/31/2014  . Gastro-esophageal reflux disease without esophagitis 11/02/2013  . Rheumatic fever 11/02/2013  .  Erectile dysfunction associated  with type 2 diabetes mellitus (HCC) 07/31/2013  . Type 2 diabetes mellitus with hyperglycemia, without long-term current use of insulin (HCC) 11/16/2012    Past Medical History:  Diagnosis Date  . Anemia   . Essential hypertension 07/18/2017  . Foot drop, left foot    due to back surgery in 01/2021  . GERD (gastroesophageal reflux disease)   . History of colonic polyps   . History of hiatal hernia    many years ago  . History of rheumatic fever   . History of stroke 07/18/2017  . Hypertriglyceridemia 07/18/2017  . S/P TAVR (transcatheter aortic valve replacement) 01/26/2022   Edwards S3UR 29mm via TF approach with Dr. Lynnette Caffey and Dr. Laneta Simmers  . Severe aortic stenosis   . Type 2 diabetes mellitus with diabetic neuropathy, unspecified (HCC) 07/18/2017    Family History  Problem Relation Age of Onset  . Hyperlipidemia Mother   . Colon polyps Mother   . Hyperlipidemia Father   . Alcoholism Brother   . Healthy Son   . Healthy Son   . Cancer Neg Hx   . Colon cancer Neg Hx   . Esophageal cancer Neg Hx   . Rectal cancer Neg Hx   . Stomach cancer Neg Hx    Past Surgical History:  Procedure Laterality Date  . COLONOSCOPY     around 2018 [facility]  . COLONOSCOPY  09/02/2020  . CRANIOTOMY Left 10/25/2019   Procedure: CRANIOTOMY HEMATOMA EVACUATION SUBDURAL;  Surgeon: Bedelia Person, MD;  Location: [facility] OR;  Service: Neurosurgery;  Laterality: Left;  . ESOPHAGOGASTRODUODENOSCOPY     around 2018 with [facility]  . FOOT CAPSULOTOMY Left 07/25/2008   Mid Foot #2 MPJ  . Hammertoe Repair Left 07/25/2008   #2 toe  . HAND SURGERY     nodule removed from left thumb and right index finger, Dr. Judithann Sheen  . INTRAOPERATIVE TRANSTHORACIC ECHOCARDIOGRAM N/A 01/26/2022   Procedure: INTRAOPERATIVE TRANSTHORACIC ECHOCARDIOGRAM;  Surgeon: Orbie Pyo, MD;  Location: [facility] OR;  Service: Open Heart Surgery;  Laterality: N/A;  . MOHS SURGERY  01/25/2023   right arm  . RIGHT/LEFT HEART  CATH AND CORONARY ANGIOGRAPHY N/A 12/11/2021   Procedure: RIGHT/LEFT HEART CATH AND CORONARY ANGIOGRAPHY;  Surgeon: Corky Crafts, MD;  Location: [facility] INVASIVE CV LAB;  Service: Cardiovascular;  Laterality: N/A;  . SPINAL FUSION  01/2021  . TARSAL TUNNEL RELEASE Left 07/25/2008  . TONSILLECTOMY     removed as a child  . TRANSCATHETER AORTIC VALVE REPLACEMENT, TRANSFEMORAL N/A 01/26/2022   Procedure: Transcatheter Aortic Valve Replacement , Transfemoral;  Surgeon: Orbie Pyo, MD;  Location: [facility] OR;  Service: Open Heart Surgery;  Laterality: N/A;  Percutaneous  . UPPER GASTROINTESTINAL ENDOSCOPY  09/02/2020   Social History   Social History Narrative  . Not on file   Immunization History  Administered Date(s) Administered  . Fluad Quad(high Dose 65+) 08/31/2021, 09/23/2022  . Fluad Trivalent(High Dose 65+) 08/29/2023  . Influenza, High Dose Seasonal PF 07/31/2013  . Influenza-Unspecified 07/31/2013, 04/17/2014  . Moderna Sars-Covid-2 Vaccination 12/11/2019, 01/10/2020, 08/08/2020  . Pneumococcal Conjugate-13 08/07/2015  . Pneumococcal Polysaccharide-23 04/17/2013, 07/18/2013  . Td 12/25/2018  . Zoster, Live 04/17/2013     Objective: Vital Signs: There were no vitals taken for this visit.   Physical Exam   Musculoskeletal Exam: ***  CDAI Exam: CDAI Score: -- Patient Global: --; Provider Global: -- Swollen: --; Tender: -- Joint Exam 01/20/2024   No  joint exam has been documented for this visit   There is currently no information documented on the homunculus. Go to the Rheumatology activity and complete the homunculus joint exam.  Investigation: No additional findings.  Imaging: No results found.  Recent Labs: Lab Results  Component Value Date   WBC 9.9 08/29/2023   HGB 8.4 Repeated and verified X2. (L) 08/29/2023   PLT 305.0 08/29/2023   NA 134 (L) 08/29/2023   K 4.5 08/29/2023   CL 102 08/29/2023   CO2 22 08/29/2023   GLUCOSE 213 (H) 08/29/2023    BUN 26 (H) 08/29/2023   CREATININE 1.50 08/29/2023   BILITOT 0.4 09/12/2023   ALKPHOS 73 09/12/2023   AST 18 09/12/2023   ALT 14 09/12/2023   PROT 6.1 09/12/2023   ALBUMIN 3.6 09/12/2023   CALCIUM 8.5 08/29/2023   GFRAA 48 (L) 07/15/2020   QFTBGOLDPLUS NEGATIVE 08/16/2023    Speciality Comments: Enbrel 05/2020  Procedures:  No procedures performed Allergies: Canagliflozin, Aspirin, Atorvastatin, and Statins   Assessment / Plan:     Visit Diagnoses: No diagnosis found.  Orders: No orders of the defined types were placed in this encounter.  No orders of the defined types were placed in this encounter.   Face-to-face time spent with patient was *** minutes. Greater than 50% of time was spent in counseling and coordination of care.  Follow-Up Instructions: No follow-ups on file.   Ellen Henri, CMA  Note - This record has been created using Animal nutritionist.  Chart creation errors have been sought, but may not always  have been located. Such creation errors do not reflect on  the standard of medical care.

## 2024-01-11 ENCOUNTER — Encounter: Payer: Self-pay | Admitting: Family Medicine

## 2024-01-11 ENCOUNTER — Ambulatory Visit: Admitting: Family Medicine

## 2024-01-11 VITALS — BP 134/78 | HR 73 | Ht 71.0 in | Wt 213.8 lb

## 2024-01-11 DIAGNOSIS — W19XXXS Unspecified fall, sequela: Secondary | ICD-10-CM

## 2024-01-11 DIAGNOSIS — R351 Nocturia: Secondary | ICD-10-CM

## 2024-01-11 DIAGNOSIS — N401 Enlarged prostate with lower urinary tract symptoms: Secondary | ICD-10-CM | POA: Diagnosis not present

## 2024-01-11 LAB — POCT URINALYSIS DIPSTICK
Bilirubin, UA: NEGATIVE
Blood, UA: NEGATIVE
Glucose, UA: NEGATIVE
Ketones, UA: NEGATIVE
Nitrite, UA: NEGATIVE
Protein, UA: POSITIVE — AB
Spec Grav, UA: <=1.005 — AB (ref 1.010–1.025)
Urobilinogen, UA: 0.2 U/dL
pH, UA: 6 (ref 5.0–8.0)

## 2024-01-11 NOTE — Patient Instructions (Addendum)
 If you do not hear anything about your referral in the next 1-2 weeks, call our office and ask for an update.  Stay off of those meds as we did not see a difference.  Warning signs/symptoms: Uncontrollable nausea/vomiting, fevers, worsening symptoms despite treatment, confusion.  Give [place] around 2 business days to get culture back to you.  Let us know if you need anything.

## 2024-01-11 NOTE — Progress Notes (Signed)
 Chief Complaint  Patient presents with   Fall    Patient presents today for a fall. Patients wife thinks he has an urinary tract infection.    Subjective: Patient is a 78 y.o. male here for a fall. Here w wife.   The patient has fallen 4 times in the last 2 weeks.  His wife cannot get him up anymore.  She has to call EMS.  He been very weak and does not ambulate on his own very much anymore.  His last bit of physical therapy was in December.  He has been reluctant to restart as he "wants to be left alone".  Part of the reason he keeps falling is because of urinary frequency.  He has a history of BPH with nocturia refractory to tamsulosin and finasteride.  His most recent urology appointment involves offering surgery and self-catheterization which he declined both.  Both he and his wife are considering this now.  They would prefer to see a urologist in our building due to proximity.  Sugars have been controlled since his urinary issues began.  He is no longer on Lasix.  He stopped taking Flomax and noticed no change.  He is still taking finasteride 5 mg daily.  Past Medical History:  Diagnosis Date   Anemia    Essential hypertension 07/18/2017   Foot drop, left foot    due to back surgery in 01/2021   GERD (gastroesophageal reflux disease)    History of colonic polyps    History of hiatal hernia    many years ago   History of rheumatic fever    History of stroke 07/18/2017   Hypertriglyceridemia 07/18/2017   S/P TAVR (transcatheter aortic valve replacement) 01/26/2022   [name] 29mm via TF approach with Dr. Lynnette Caffey and Dr. Laneta Simmers   Severe aortic stenosis    Type 2 diabetes mellitus with diabetic neuropathy, unspecified (HCC) 07/18/2017    Objective: BP 134/78   Pulse 73   Ht 5\' 11"  (1.803 m)   Wt 213 lb 12.8 oz (97 kg)   SpO2 99%   BMI 29.82 kg/m  General: Awake, appears stated age Heart: RRR, 2+ pitting bilateral LE edema tapering at the knees Abdomen: Bowel sounds  present, soft, nondistended, nontender Lungs: CTAB, no rales, wheezes or rhonchi. No accessory muscle use Skin: Ecchymosis over the right orbit with associated edema, excoriation/laceration noted over the right outer-upper orbit. Psych: Age appropriate judgment and insight, normal affect and mood  Assessment and Plan: BPH associated with nocturia - Plan: Ambulatory referral to Urology  Fall, sequela - Plan: POCT urinalysis dipstick, Ambulatory referral to Home Health, Urine Culture  Chronic, not controlled.  Continue finasteride 5 mg daily.  Stay off of Flomax given the frequent falls.  Refer to Dr. Pete Glatter upstairs. Check urine today for infection.  Will set up home health physical therapy again. The patient voiced understanding and agreement to the plan.  I spent 38 minutes with the patient and his wife discussing the above plans in addition to reviewing his chart on the same day of the visit.  Jilda Roche [place], DO 01/11/24  1:30 PM

## 2024-01-13 LAB — URINE CULTURE
MICRO NUMBER:: 16250359
SPECIMEN QUALITY:: ADEQUATE

## 2024-01-14 ENCOUNTER — Other Ambulatory Visit: Payer: Self-pay | Admitting: Family Medicine

## 2024-01-14 MED ORDER — CEFDINIR 300 MG PO CAPS: 300.0000 mg | ORAL_CAPSULE | Freq: Two times a day (BID) | ORAL | 0 refills | Status: DC

## 2024-01-15 ENCOUNTER — Other Ambulatory Visit: Payer: Self-pay

## 2024-01-15 ENCOUNTER — Emergency Department (HOSPITAL_BASED_OUTPATIENT_CLINIC_OR_DEPARTMENT_OTHER)

## 2024-01-15 ENCOUNTER — Encounter (HOSPITAL_BASED_OUTPATIENT_CLINIC_OR_DEPARTMENT_OTHER): Payer: Self-pay

## 2024-01-15 ENCOUNTER — Inpatient Hospital Stay (HOSPITAL_BASED_OUTPATIENT_CLINIC_OR_DEPARTMENT_OTHER)
Admission: EM | Admit: 2024-01-15 | Discharge: 2024-01-26 | DRG: 377 | Disposition: A | Discharge status: Skilled Nursing Facility | Attending: Internal Medicine | Admitting: Internal Medicine

## 2024-01-15 DIAGNOSIS — Z860100 Personal history of colon polyps, unspecified: Secondary | ICD-10-CM

## 2024-01-15 DIAGNOSIS — I8501 Esophageal varices with bleeding: Secondary | ICD-10-CM | POA: Diagnosis not present

## 2024-01-15 DIAGNOSIS — Z515 Encounter for palliative care: Secondary | ICD-10-CM | POA: Diagnosis not present

## 2024-01-15 DIAGNOSIS — N1330 Unspecified hydronephrosis: Secondary | ICD-10-CM | POA: Diagnosis not present

## 2024-01-15 DIAGNOSIS — E663 Overweight: Secondary | ICD-10-CM | POA: Diagnosis present

## 2024-01-15 DIAGNOSIS — N1832 Chronic kidney disease, stage 3b: Secondary | ICD-10-CM | POA: Diagnosis not present

## 2024-01-15 DIAGNOSIS — N189 Chronic kidney disease, unspecified: Secondary | ICD-10-CM | POA: Diagnosis not present

## 2024-01-15 DIAGNOSIS — R195 Other fecal abnormalities: Secondary | ICD-10-CM

## 2024-01-15 DIAGNOSIS — Z811 Family history of alcohol abuse and dependence: Secondary | ICD-10-CM

## 2024-01-15 DIAGNOSIS — R4189 Other symptoms and signs involving cognitive functions and awareness: Secondary | ICD-10-CM | POA: Diagnosis present

## 2024-01-15 DIAGNOSIS — E869 Volume depletion, unspecified: Secondary | ICD-10-CM | POA: Diagnosis present

## 2024-01-15 DIAGNOSIS — Z87891 Personal history of nicotine dependence: Secondary | ICD-10-CM

## 2024-01-15 DIAGNOSIS — Z794 Long term (current) use of insulin: Secondary | ICD-10-CM

## 2024-01-15 DIAGNOSIS — R634 Abnormal weight loss: Secondary | ICD-10-CM | POA: Diagnosis not present

## 2024-01-15 DIAGNOSIS — D631 Anemia in chronic kidney disease: Secondary | ICD-10-CM | POA: Diagnosis present

## 2024-01-15 DIAGNOSIS — E781 Pure hyperglyceridemia: Secondary | ICD-10-CM | POA: Diagnosis present

## 2024-01-15 DIAGNOSIS — R296 Repeated falls: Secondary | ICD-10-CM | POA: Diagnosis not present

## 2024-01-15 DIAGNOSIS — R338 Other retention of urine: Secondary | ICD-10-CM | POA: Diagnosis not present

## 2024-01-15 DIAGNOSIS — Z7901 Long term (current) use of anticoagulants: Secondary | ICD-10-CM | POA: Diagnosis not present

## 2024-01-15 DIAGNOSIS — K2981 Duodenitis with bleeding: Secondary | ICD-10-CM | POA: Diagnosis not present

## 2024-01-15 DIAGNOSIS — M0520 Rheumatoid vasculitis with rheumatoid arthritis of unspecified site: Secondary | ICD-10-CM | POA: Diagnosis not present

## 2024-01-15 DIAGNOSIS — I82611 Acute embolism and thrombosis of superficial veins of right upper extremity: Secondary | ICD-10-CM | POA: Diagnosis present

## 2024-01-15 DIAGNOSIS — K922 Gastrointestinal hemorrhage, unspecified: Secondary | ICD-10-CM | POA: Diagnosis not present

## 2024-01-15 DIAGNOSIS — R2689 Other abnormalities of gait and mobility: Secondary | ICD-10-CM | POA: Diagnosis not present

## 2024-01-15 DIAGNOSIS — R932 Abnormal findings on diagnostic imaging of liver and biliary tract: Secondary | ICD-10-CM | POA: Diagnosis not present

## 2024-01-15 DIAGNOSIS — N136 Pyonephrosis: Secondary | ICD-10-CM | POA: Diagnosis not present

## 2024-01-15 DIAGNOSIS — I10 Essential (primary) hypertension: Secondary | ICD-10-CM | POA: Diagnosis not present

## 2024-01-15 DIAGNOSIS — I130 Hypertensive heart and chronic kidney disease with heart failure and stage 1 through stage 4 chronic kidney disease, or unspecified chronic kidney disease: Secondary | ICD-10-CM | POA: Diagnosis present

## 2024-01-15 DIAGNOSIS — I82621 Acute embolism and thrombosis of deep veins of right upper extremity: Secondary | ICD-10-CM | POA: Diagnosis not present

## 2024-01-15 DIAGNOSIS — K750 Abscess of liver: Secondary | ICD-10-CM | POA: Diagnosis not present

## 2024-01-15 DIAGNOSIS — K222 Esophageal obstruction: Secondary | ICD-10-CM | POA: Diagnosis not present

## 2024-01-15 DIAGNOSIS — S3991XA Unspecified injury of abdomen, initial encounter: Secondary | ICD-10-CM | POA: Diagnosis not present

## 2024-01-15 DIAGNOSIS — E871 Hypo-osmolality and hyponatremia: Secondary | ICD-10-CM | POA: Diagnosis present

## 2024-01-15 DIAGNOSIS — Z66 Do not resuscitate: Secondary | ICD-10-CM | POA: Diagnosis present

## 2024-01-15 DIAGNOSIS — Z79899 Other long term (current) drug therapy: Secondary | ICD-10-CM

## 2024-01-15 DIAGNOSIS — M069 Rheumatoid arthritis, unspecified: Secondary | ICD-10-CM | POA: Diagnosis present

## 2024-01-15 DIAGNOSIS — N390 Urinary tract infection, site not specified: Secondary | ICD-10-CM | POA: Diagnosis not present

## 2024-01-15 DIAGNOSIS — R112 Nausea with vomiting, unspecified: Secondary | ICD-10-CM | POA: Diagnosis not present

## 2024-01-15 DIAGNOSIS — Z83719 Family history of colon polyps, unspecified: Secondary | ICD-10-CM

## 2024-01-15 DIAGNOSIS — N179 Acute kidney failure, unspecified: Secondary | ICD-10-CM | POA: Diagnosis present

## 2024-01-15 DIAGNOSIS — R1310 Dysphagia, unspecified: Secondary | ICD-10-CM | POA: Diagnosis not present

## 2024-01-15 DIAGNOSIS — I82409 Acute embolism and thrombosis of unspecified deep veins of unspecified lower extremity: Secondary | ICD-10-CM | POA: Diagnosis not present

## 2024-01-15 DIAGNOSIS — R300 Dysuria: Secondary | ICD-10-CM | POA: Diagnosis not present

## 2024-01-15 DIAGNOSIS — D649 Anemia, unspecified: Secondary | ICD-10-CM | POA: Diagnosis not present

## 2024-01-15 DIAGNOSIS — Z981 Arthrodesis status: Secondary | ICD-10-CM | POA: Diagnosis not present

## 2024-01-15 DIAGNOSIS — K2950 Unspecified chronic gastritis without bleeding: Secondary | ICD-10-CM | POA: Diagnosis not present

## 2024-01-15 DIAGNOSIS — K2951 Unspecified chronic gastritis with bleeding: Secondary | ICD-10-CM | POA: Diagnosis not present

## 2024-01-15 DIAGNOSIS — K3189 Other diseases of stomach and duodenum: Secondary | ICD-10-CM | POA: Diagnosis not present

## 2024-01-15 DIAGNOSIS — R160 Hepatomegaly, not elsewhere classified: Secondary | ICD-10-CM

## 2024-01-15 DIAGNOSIS — E1169 Type 2 diabetes mellitus with other specified complication: Secondary | ICD-10-CM | POA: Diagnosis present

## 2024-01-15 DIAGNOSIS — D509 Iron deficiency anemia, unspecified: Secondary | ICD-10-CM | POA: Diagnosis not present

## 2024-01-15 DIAGNOSIS — R9089 Other abnormal findings on diagnostic imaging of central nervous system: Secondary | ICD-10-CM | POA: Diagnosis not present

## 2024-01-15 DIAGNOSIS — I6523 Occlusion and stenosis of bilateral carotid arteries: Secondary | ICD-10-CM | POA: Diagnosis not present

## 2024-01-15 DIAGNOSIS — Z789 Other specified health status: Secondary | ICD-10-CM | POA: Diagnosis not present

## 2024-01-15 DIAGNOSIS — K5730 Diverticulosis of large intestine without perforation or abscess without bleeding: Secondary | ICD-10-CM | POA: Diagnosis not present

## 2024-01-15 DIAGNOSIS — K449 Diaphragmatic hernia without obstruction or gangrene: Secondary | ICD-10-CM | POA: Diagnosis not present

## 2024-01-15 DIAGNOSIS — E1122 Type 2 diabetes mellitus with diabetic chronic kidney disease: Secondary | ICD-10-CM | POA: Diagnosis present

## 2024-01-15 DIAGNOSIS — Z6829 Body mass index (BMI) 29.0-29.9, adult: Secondary | ICD-10-CM

## 2024-01-15 DIAGNOSIS — E872 Acidosis, unspecified: Secondary | ICD-10-CM | POA: Diagnosis not present

## 2024-01-15 DIAGNOSIS — R531 Weakness: Secondary | ICD-10-CM

## 2024-01-15 DIAGNOSIS — E1140 Type 2 diabetes mellitus with diabetic neuropathy, unspecified: Secondary | ICD-10-CM | POA: Diagnosis present

## 2024-01-15 DIAGNOSIS — S0990XA Unspecified injury of head, initial encounter: Secondary | ICD-10-CM | POA: Diagnosis not present

## 2024-01-15 DIAGNOSIS — B9689 Other specified bacterial agents as the cause of diseases classified elsewhere: Secondary | ICD-10-CM

## 2024-01-15 DIAGNOSIS — Z9049 Acquired absence of other specified parts of digestive tract: Secondary | ICD-10-CM | POA: Diagnosis not present

## 2024-01-15 DIAGNOSIS — D62 Acute posthemorrhagic anemia: Secondary | ICD-10-CM | POA: Diagnosis not present

## 2024-01-15 DIAGNOSIS — R2681 Unsteadiness on feet: Secondary | ICD-10-CM | POA: Diagnosis not present

## 2024-01-15 DIAGNOSIS — B9629 Other Escherichia coli [E. coli] as the cause of diseases classified elsewhere: Secondary | ICD-10-CM | POA: Diagnosis not present

## 2024-01-15 DIAGNOSIS — J392 Other diseases of pharynx: Secondary | ICD-10-CM | POA: Diagnosis not present

## 2024-01-15 DIAGNOSIS — Z85820 Personal history of malignant melanoma of skin: Secondary | ICD-10-CM

## 2024-01-15 DIAGNOSIS — Z7982 Long term (current) use of aspirin: Secondary | ICD-10-CM

## 2024-01-15 DIAGNOSIS — Z888 Allergy status to other drugs, medicaments and biological substances status: Secondary | ICD-10-CM

## 2024-01-15 DIAGNOSIS — M21372 Foot drop, left foot: Secondary | ICD-10-CM | POA: Diagnosis present

## 2024-01-15 DIAGNOSIS — Z952 Presence of prosthetic heart valve: Secondary | ICD-10-CM | POA: Diagnosis not present

## 2024-01-15 DIAGNOSIS — I2510 Atherosclerotic heart disease of native coronary artery without angina pectoris: Secondary | ICD-10-CM | POA: Diagnosis present

## 2024-01-15 DIAGNOSIS — Z886 Allergy status to analgesic agent status: Secondary | ICD-10-CM

## 2024-01-15 DIAGNOSIS — Z83438 Family history of other disorder of lipoprotein metabolism and other lipidemia: Secondary | ICD-10-CM

## 2024-01-15 DIAGNOSIS — M6281 Muscle weakness (generalized): Secondary | ICD-10-CM | POA: Diagnosis not present

## 2024-01-15 DIAGNOSIS — N401 Enlarged prostate with lower urinary tract symptoms: Secondary | ICD-10-CM | POA: Diagnosis present

## 2024-01-15 DIAGNOSIS — R911 Solitary pulmonary nodule: Secondary | ICD-10-CM

## 2024-01-15 DIAGNOSIS — I868 Varicose veins of other specified sites: Secondary | ICD-10-CM | POA: Diagnosis present

## 2024-01-15 DIAGNOSIS — B9620 Unspecified Escherichia coli [E. coli] as the cause of diseases classified elsewhere: Secondary | ICD-10-CM | POA: Diagnosis present

## 2024-01-15 DIAGNOSIS — N281 Cyst of kidney, acquired: Secondary | ICD-10-CM | POA: Diagnosis not present

## 2024-01-15 DIAGNOSIS — M2681 Anterior soft tissue impingement: Secondary | ICD-10-CM | POA: Diagnosis not present

## 2024-01-15 DIAGNOSIS — I493 Ventricular premature depolarization: Secondary | ICD-10-CM | POA: Diagnosis not present

## 2024-01-15 DIAGNOSIS — K769 Liver disease, unspecified: Secondary | ICD-10-CM | POA: Diagnosis not present

## 2024-01-15 DIAGNOSIS — I776 Arteritis, unspecified: Secondary | ICD-10-CM | POA: Diagnosis not present

## 2024-01-15 DIAGNOSIS — W19XXXA Unspecified fall, initial encounter: Secondary | ICD-10-CM | POA: Diagnosis not present

## 2024-01-15 DIAGNOSIS — E876 Hypokalemia: Secondary | ICD-10-CM | POA: Diagnosis present

## 2024-01-15 DIAGNOSIS — E119 Type 2 diabetes mellitus without complications: Secondary | ICD-10-CM | POA: Diagnosis not present

## 2024-01-15 DIAGNOSIS — R609 Edema, unspecified: Secondary | ICD-10-CM | POA: Diagnosis not present

## 2024-01-15 DIAGNOSIS — S199XXA Unspecified injury of neck, initial encounter: Secondary | ICD-10-CM | POA: Diagnosis not present

## 2024-01-15 DIAGNOSIS — R41841 Cognitive communication deficit: Secondary | ICD-10-CM | POA: Diagnosis not present

## 2024-01-15 DIAGNOSIS — R933 Abnormal findings on diagnostic imaging of other parts of digestive tract: Secondary | ICD-10-CM | POA: Diagnosis not present

## 2024-01-15 DIAGNOSIS — R350 Frequency of micturition: Secondary | ICD-10-CM | POA: Diagnosis not present

## 2024-01-15 DIAGNOSIS — S299XXA Unspecified injury of thorax, initial encounter: Secondary | ICD-10-CM | POA: Diagnosis not present

## 2024-01-15 DIAGNOSIS — Z8673 Personal history of transient ischemic attack (TIA), and cerebral infarction without residual deficits: Secondary | ICD-10-CM

## 2024-01-15 DIAGNOSIS — R9431 Abnormal electrocardiogram [ECG] [EKG]: Secondary | ICD-10-CM | POA: Diagnosis not present

## 2024-01-15 DIAGNOSIS — I8510 Secondary esophageal varices without bleeding: Secondary | ICD-10-CM | POA: Diagnosis not present

## 2024-01-15 DIAGNOSIS — K2980 Duodenitis without bleeding: Secondary | ICD-10-CM | POA: Diagnosis not present

## 2024-01-15 DIAGNOSIS — Z7189 Other specified counseling: Secondary | ICD-10-CM | POA: Diagnosis not present

## 2024-01-15 DIAGNOSIS — E785 Hyperlipidemia, unspecified: Secondary | ICD-10-CM | POA: Diagnosis present

## 2024-01-15 DIAGNOSIS — R319 Hematuria, unspecified: Secondary | ICD-10-CM | POA: Diagnosis not present

## 2024-01-15 DIAGNOSIS — I8500 Esophageal varices without bleeding: Secondary | ICD-10-CM | POA: Diagnosis not present

## 2024-01-15 LAB — BASIC METABOLIC PANEL WITH GFR
Anion gap: 12 (ref 5–15)
BUN: 49 mg/dL — ABNORMAL HIGH (ref 8–23)
CO2: 17 mmol/L — ABNORMAL LOW (ref 22–32)
Calcium: 8.6 mg/dL — ABNORMAL LOW (ref 8.9–10.3)
Chloride: 102 mmol/L (ref 98–111)
Creatinine, Ser: 2.49 mg/dL — ABNORMAL HIGH (ref 0.61–1.24)
GFR, Estimated: 26 mL/min — ABNORMAL LOW (ref >60)
Glucose, Bld: 122 mg/dL — ABNORMAL HIGH (ref 70–99)
Potassium: 4.7 mmol/L (ref 3.5–5.1)
Sodium: 131 mmol/L — ABNORMAL LOW (ref 135–145)

## 2024-01-15 LAB — HEMOGLOBIN AND HEMATOCRIT, BLOOD
HCT: 18.1 % — ABNORMAL LOW (ref 39.0–52.0)
Hemoglobin: 6.1 g/dL — CL (ref 13.0–17.0)

## 2024-01-15 LAB — CBC WITH DIFFERENTIAL/PLATELET
Abs Immature Granulocytes: 0.06 10*3/uL (ref 0.00–0.07)
Basophils Absolute: 0.1 10*3/uL (ref 0.0–0.1)
Basophils Relative: 0 %
Eosinophils Absolute: 0.1 10*3/uL (ref 0.0–0.5)
Eosinophils Relative: 1 %
HCT: 20 % — ABNORMAL LOW (ref 39.0–52.0)
Hemoglobin: 6.5 g/dL — CL (ref 13.0–17.0)
Immature Granulocytes: 1 %
Lymphocytes Relative: 25 %
Lymphs Abs: 2.8 10*3/uL (ref 0.7–4.0)
MCH: 25.2 pg — ABNORMAL LOW (ref 26.0–34.0)
MCHC: 32.5 g/dL (ref 30.0–36.0)
MCV: 77.5 fL — ABNORMAL LOW (ref 80.0–100.0)
Monocytes Absolute: 0.8 10*3/uL (ref 0.1–1.0)
Monocytes Relative: 8 %
Neutro Abs: 7.4 10*3/uL (ref 1.7–7.7)
Neutrophils Relative %: 65 %
Platelets: 433 10*3/uL — ABNORMAL HIGH (ref 150–400)
RBC: 2.58 MIL/uL — ABNORMAL LOW (ref 4.22–5.81)
RDW: 17 % — ABNORMAL HIGH (ref 11.5–15.5)
WBC: 11.2 10*3/uL — ABNORMAL HIGH (ref 4.0–10.5)
nRBC: 0 % (ref 0.0–0.2)

## 2024-01-15 LAB — URINALYSIS, W/ REFLEX TO CULTURE (INFECTION SUSPECTED)
Bilirubin Urine: NEGATIVE
Glucose, UA: NEGATIVE mg/dL
Ketones, ur: NEGATIVE mg/dL
Nitrite: NEGATIVE
Protein, ur: 30 mg/dL — AB
Specific Gravity, Urine: 1.01 (ref 1.005–1.030)
WBC, UA: >50 WBC/hpf (ref 0–5)
pH: 5.5 (ref 5.0–8.0)

## 2024-01-15 LAB — GLUCOSE, CAPILLARY
Glucose-Capillary: 85 mg/dL (ref 70–99)
Glucose-Capillary: 138 mg/dL — ABNORMAL HIGH (ref 70–99)

## 2024-01-15 LAB — LACTIC ACID, PLASMA
Lactic Acid, Venous: 1.2 mmol/L (ref 0.5–1.9)
Lactic Acid, Venous: 1 mmol/L (ref 0.5–1.9)

## 2024-01-15 LAB — HEMOGLOBIN A1C
Hgb A1c MFr Bld: 5.8 % — ABNORMAL HIGH (ref 4.8–5.6)
Mean Plasma Glucose: 119.76 mg/dL

## 2024-01-15 LAB — PREPARE RBC (CROSSMATCH)

## 2024-01-15 LAB — CBG MONITORING, ED: Glucose-Capillary: 118 mg/dL — ABNORMAL HIGH (ref 70–99)

## 2024-01-15 LAB — OCCULT BLOOD X 1 CARD TO LAB, STOOL: Fecal Occult Bld: POSITIVE — AB

## 2024-01-15 MED ORDER — ALBUTEROL SULFATE (2.5 MG/3ML) 0.083% IN NEBU: 2.5000 mg | INHALATION_SOLUTION | RESPIRATORY_TRACT | Status: DC | PRN

## 2024-01-15 MED ORDER — SODIUM CHLORIDE 0.9 % IV SOLN
1.0000 g | INTRAVENOUS | Status: AC
  Administered 2024-01-16 – 2024-01-19 (×4): 1 g via INTRAVENOUS
  Filled 2024-01-15 (×4): qty 10

## 2024-01-15 MED ORDER — INSULIN ASPART 100 UNIT/ML IJ SOLN: 0.0000 [IU] | Freq: Every day | INTRAMUSCULAR | Status: DC

## 2024-01-15 MED ORDER — FLUTICASONE PROPIONATE 50 MCG/ACT NA SUSP: 2.0000 | Freq: Every day | NASAL | Status: DC | PRN

## 2024-01-15 MED ORDER — INSULIN ASPART 100 UNIT/ML IJ SOLN
0.0000 [IU] | Freq: Three times a day (TID) | INTRAMUSCULAR | Status: DC
  Administered 2024-01-16: 2 [IU] via SUBCUTANEOUS
  Administered 2024-01-16: 3 [IU] via SUBCUTANEOUS
  Administered 2024-01-16: 2 [IU] via SUBCUTANEOUS
  Administered 2024-01-17 (×2): 3 [IU] via SUBCUTANEOUS
  Administered 2024-01-18 – 2024-01-19 (×2): 2 [IU] via SUBCUTANEOUS
  Administered 2024-01-19: 3 [IU] via SUBCUTANEOUS
  Administered 2024-01-19 – 2024-01-20 (×2): 2 [IU] via SUBCUTANEOUS
  Administered 2024-01-20: 3 [IU] via SUBCUTANEOUS
  Administered 2024-01-20 – 2024-01-21 (×3): 2 [IU] via SUBCUTANEOUS
  Administered 2024-01-22: 5 [IU] via SUBCUTANEOUS
  Administered 2024-01-22: 2 [IU] via SUBCUTANEOUS
  Administered 2024-01-22 – 2024-01-23 (×2): 3 [IU] via SUBCUTANEOUS
  Administered 2024-01-23: 2 [IU] via SUBCUTANEOUS
  Administered 2024-01-23 – 2024-01-24 (×3): 3 [IU] via SUBCUTANEOUS
  Administered 2024-01-24: 2 [IU] via SUBCUTANEOUS
  Administered 2024-01-25 (×2): 3 [IU] via SUBCUTANEOUS
  Administered 2024-01-25 – 2024-01-26 (×2): 2 [IU] via SUBCUTANEOUS
  Administered 2024-01-26: 5 [IU] via SUBCUTANEOUS

## 2024-01-15 MED ORDER — SODIUM CHLORIDE 0.9 % IV SOLN
1.0000 g | Freq: Once | INTRAVENOUS | Status: AC
  Administered 2024-01-15: 1 g via INTRAVENOUS
  Filled 2024-01-15: qty 10

## 2024-01-15 MED ORDER — PANTOPRAZOLE SODIUM 40 MG PO TBEC
40.0000 mg | DELAYED_RELEASE_TABLET | Freq: Every day | ORAL | Status: DC
  Administered 2024-01-15 – 2024-01-26 (×12): 40 mg via ORAL
  Filled 2024-01-15 (×12): qty 1

## 2024-01-15 MED ORDER — ONDANSETRON HCL 4 MG PO TABS
4.0000 mg | ORAL_TABLET | Freq: Four times a day (QID) | ORAL | Status: DC | PRN
  Administered 2024-01-20: 4 mg via ORAL
  Filled 2024-01-15: qty 1

## 2024-01-15 MED ORDER — SODIUM CHLORIDE 0.9 % IV SOLN: INTRAVENOUS | Status: DC

## 2024-01-15 MED ORDER — SODIUM CHLORIDE 0.9 % IV SOLN: Freq: Once | INTRAVENOUS | Status: AC

## 2024-01-15 MED ORDER — FINASTERIDE 5 MG PO TABS
5.0000 mg | ORAL_TABLET | Freq: Every day | ORAL | Status: DC
  Administered 2024-01-15 – 2024-01-26 (×12): 5 mg via ORAL
  Filled 2024-01-15 (×12): qty 1

## 2024-01-15 MED ORDER — INSULIN GLARGINE-YFGN 100 UNIT/ML ~~LOC~~ SOLN
10.0000 [IU] | Freq: Every day | SUBCUTANEOUS | Status: DC
  Administered 2024-01-15: 10 [IU] via SUBCUTANEOUS
  Filled 2024-01-15: qty 0.1

## 2024-01-15 MED ORDER — ONDANSETRON HCL 4 MG/2ML IJ SOLN: 4.0000 mg | Freq: Four times a day (QID) | INTRAMUSCULAR | Status: DC | PRN

## 2024-01-15 MED ORDER — ACETAMINOPHEN 325 MG PO TABS
650.0000 mg | ORAL_TABLET | Freq: Four times a day (QID) | ORAL | Status: DC | PRN
  Administered 2024-01-18 – 2024-01-26 (×5): 650 mg via ORAL
  Filled 2024-01-15 (×5): qty 2

## 2024-01-15 MED ORDER — MELATONIN 5 MG PO TABS
5.0000 mg | ORAL_TABLET | Freq: Every day | ORAL | Status: DC
  Administered 2024-01-15 – 2024-01-25 (×11): 5 mg via ORAL
  Filled 2024-01-15 (×11): qty 1

## 2024-01-15 MED ORDER — QUETIAPINE FUMARATE 25 MG PO TABS
25.0000 mg | ORAL_TABLET | Freq: Every day | ORAL | Status: DC
  Administered 2024-01-15 – 2024-01-21 (×7): 25 mg via ORAL
  Filled 2024-01-15 (×7): qty 1

## 2024-01-15 MED ORDER — PANTOPRAZOLE SODIUM 40 MG IV SOLR
40.0000 mg | Freq: Once | INTRAVENOUS | Status: AC
  Administered 2024-01-15: 40 mg via INTRAVENOUS
  Filled 2024-01-15: qty 10

## 2024-01-15 MED ORDER — FENOFIBRATE 54 MG PO TABS
54.0000 mg | ORAL_TABLET | Freq: Every day | ORAL | Status: DC
  Administered 2024-01-16 – 2024-01-26 (×11): 54 mg via ORAL
  Filled 2024-01-15 (×11): qty 1

## 2024-01-15 MED ORDER — ACETAMINOPHEN 650 MG RE SUPP: 650.0000 mg | Freq: Four times a day (QID) | RECTAL | Status: DC | PRN

## 2024-01-15 MED ORDER — EZETIMIBE 10 MG PO TABS
10.0000 mg | ORAL_TABLET | Freq: Every evening | ORAL | Status: DC
  Administered 2024-01-15 – 2024-01-25 (×11): 10 mg via ORAL
  Filled 2024-01-15 (×11): qty 1

## 2024-01-15 MED ORDER — ESCITALOPRAM OXALATE 20 MG PO TABS
20.0000 mg | ORAL_TABLET | Freq: Every day | ORAL | Status: DC
  Administered 2024-01-15 – 2024-01-26 (×11): 20 mg via ORAL
  Filled 2024-01-15 (×11): qty 1

## 2024-01-15 MED ORDER — SODIUM CHLORIDE 0.9% IV SOLUTION
Freq: Once | INTRAVENOUS | Status: AC
Start: 2024-01-15 — End: 2024-01-15

## 2024-01-15 NOTE — ED Notes (Signed)
 Noticeable bruising to the right eye that the wife explains if from 4 falls in the past 2 days, also upon palpation pt had abd tenderness more specifically on the left side of his abdomen.

## 2024-01-15 NOTE — ED Notes (Signed)
 Patient transported to CT

## 2024-01-15 NOTE — Consult Note (Signed)
 CROSS COVER ;HC-GI Reason for Consult: Severe iron deficiency anemia. Referring Physician: [facility].  Tommy Green is an 78 y.o. male.  HPI: Tommy Green is a 78 year old white male, patient of Dr. Barron Alvine from the [facility] GI service, with multiple medical problems listed below transferred from [facility] to [facility] for further evaluation of acute blood loss anemia and multiple falls at home. His hemoglobin in the ED was 6.5 g/dL in the hospital here today 6.1 g/dL the patient's wife. who is at the bedside tells me he has had a history of iron deficiency anemia and required multiple blood transfusions last year but as she felt they were doing unnecessary transfusions they stopped going to the transfusion center sometime in the summer of last year and cannot remember exact reason why she never called the doctor back to find out why he needed the blood transfusions. She claims he never saw a hematologist for further workup of his anemia. On chart review he had an EGD and a colonoscopy done on 09/02/2020 by Dr. Barron Alvine for workup of iron deficiency anemia, when he was noted to have grade 1 varices in the upper third of the esophagus and nonobstructing Schatzki's ring in the lower third of the esophagus small with multiple sessile polyps in the stomach with no evidence of recent bleeding and the examined duodenum was normal. Biopsies were done to rule out celiac sprue. On the colonoscopy, he was noted to have 2 sessile polyps in the descending and transverse colon that were removed via cold snare multiple small and large mouth diverticula were noted in the sigmoid colon Blue blebs were noted in the rectum without evidence of frank rectal varices; gentle hemorrhoids were noted on retroflexion.  The small bowel biopsies were negative for celiac sprue in the colonic polyps were tubular adenomas on pathology. The polyps stomach were fundic gland polyps. Patient claims he has  normal bowel movements and does not use any OTC nonsteroidals for Aspirin. He had a normal BM this morning with no evidence of blood or mucus in the stool. He denies having any history of melena. He is having any weakness or dizziness and feels that these falls are due to mechanical reasons.  Did have some weakness and dizziness resulting in a fall.  In the ER he was noted to have a UTI as well as severe anemia and they was transferred to [facility] long hospital for further workup and evaluation.  Admission is a CT scan of the abdomen pelvis that reveals new ill-defined heterogeneous hypoattenuating mass in the right upper lobe of the liver 4.2 x 4.8 cm highly concerning for neoplastic process process along with multiple other hypoattenuating structures in the liver also to be due to metastatic disease no pancreatic ductal dilatation or inflammatory changes were noted the adrenal glands were unremarkable a small sliding hernia was noted and there was no evidence of abnormal bowel thickening inflammatory change multiple diverticula noted in the left colon with no evidence of diverticulitis; bilateral small  fat-containing inguinal hernias were noted; there was a subcentimeter lytic lesion in the right iliac bone which is thought to be benign and multiple levels of degenerative disc disease in the spine was noted no acute trauma injury injury was noted to the chest abdomen or pelvis. He is on Omeprazole at home for reflux.  According to his wife he is to wait to 60 pounds for 5 years ago and was dropped down to 197 pounds recently with  most of the weight loss being on the last 2 to 3 years.His appetite is fair.  He was found to be FOBT positive on evaluation and at [facility].  Past Medical History:  Diagnosis Date   Anemia    Essential hypertension 07/18/2017   Foot drop, left foot    due to back surgery in 01/2021   GERD (gastroesophageal reflux disease)    History of colonic polyps    History of  hiatal hernia    many years ago   History of rheumatic fever    History of stroke 07/18/2017   Hypertriglyceridemia 07/18/2017   S/P TAVR (transcatheter aortic valve replacement) 01/26/2022   Edwards S3UR 29mm via TF approach with Dr. Lynnette Caffey and Dr. Laneta Simmers   Severe aortic stenosis    Type 2 diabetes mellitus with diabetic neuropathy, unspecified (HCC) 07/18/2017   Past Surgical History:  Procedure Laterality Date   COLONOSCOPY     around 2018 [facility]   COLONOSCOPY  09/02/2020   CRANIOTOMY Left 10/25/2019   Procedure: CRANIOTOMY HEMATOMA EVACUATION SUBDURAL;  Surgeon: Bedelia Person, MD;  Location: [facility] OR;  Service: Neurosurgery;  Laterality: Left;   ESOPHAGOGASTRODUODENOSCOPY     around 2018 with [facility]   FOOT CAPSULOTOMY Left 07/25/2008   Mid Foot #2 MPJ   Hammertoe Repair Left 07/25/2008   #2 toe   HAND SURGERY     nodule removed from left thumb and right index finger, Dr. Judithann Sheen   INTRAOPERATIVE TRANSTHORACIC ECHOCARDIOGRAM N/A 01/26/2022   Procedure: INTRAOPERATIVE TRANSTHORACIC ECHOCARDIOGRAM;  Surgeon: Orbie Pyo, MD;  Location: [facility] OR;  Service: Open Heart Surgery;  Laterality: N/A;   MOHS SURGERY  01/25/2023   right arm   RIGHT/LEFT HEART CATH AND CORONARY ANGIOGRAPHY N/A 12/11/2021   Procedure: RIGHT/LEFT HEART CATH AND CORONARY ANGIOGRAPHY;  Surgeon: Corky Crafts, MD;  Location: [facility] INVASIVE CV LAB;  Service: Cardiovascular;  Laterality: N/A;   SPINAL FUSION  01/2021   TARSAL TUNNEL RELEASE Left 07/25/2008   TONSILLECTOMY     removed as a child   TRANSCATHETER AORTIC VALVE REPLACEMENT, TRANSFEMORAL N/A 01/26/2022   Procedure: Transcatheter Aortic Valve Replacement , Transfemoral;  Surgeon: Orbie Pyo, MD;  Location: [facility] OR;  Service: Open Heart Surgery;  Laterality: N/A;  Percutaneous   UPPER GASTROINTESTINAL ENDOSCOPY  09/02/2020   Family History  Problem Relation Age of Onset   Hyperlipidemia Mother    Colon polyps Mother     Hyperlipidemia Father    Alcoholism Brother    Healthy Son    Healthy Son    Cancer Neg Hx    Colon cancer Neg Hx    Esophageal cancer Neg Hx    Rectal cancer Neg Hx    Stomach cancer Neg Hx    Social History:  reports that he quit smoking about 52 years ago. His smoking use included cigarettes. He started smoking about 62 years ago. He has a 10 pack-year smoking history. He has been exposed to tobacco smoke. He has never used smokeless tobacco. He reports that he does not currently use alcohol. He reports that he does not use drugs.  Allergies:  Allergies  Allergen Reactions   Canagliflozin Other (See Comments)    Pt reports that the use of Invokana caused acute KIDNEY FAILURE ([facility] records) and "allergic," per [facility]   Aspirin Other (See Comments)    Gastroesophageal reflux and "allergic," per [facility]    Atorvastatin Other (See Comments)  Myalgias, GI upset, and "allergic," per [facility]   Statins Other (See Comments)    Muscle pain- "allergic," per [facility]   Medications: I have reviewed the patient's current medications. Prior to Admission:  Medications Prior to Admission  Medication Sig Dispense Refill Last Dose/Taking   acetaminophen (TYLENOL) 325 MG tablet Take 650 mg by mouth every 6 (six) hours as needed for mild pain or headache.      amoxicillin (AMOXIL) 500 MG tablet Take 4 tablets (2,000 mg total) by mouth as directed. 1 HOUR PRIOR TO DENTAL APPOINTMENTS 12 tablet 6    aspirin EC 81 MG tablet Take 1 tablet (81 mg total) by mouth daily. Swallow whole. 90 tablet 3    B Complex Vitamins (B COMPLEX PO) Take 1 tablet by mouth daily.      cefdinir (OMNICEF) 300 MG capsule Take 1 capsule (300 mg total) by mouth 2 (two) times daily for 7 days. 14 capsule 0    Cholecalciferol (VITAMIN D3 SUPER STRENGTH) 50 MCG (2000 UT) CAPS Take 2,000 Units by mouth daily.      escitalopram (LEXAPRO) 20 MG tablet Take 1 tablet (20 mg total) by mouth daily. 90 tablet 2    etanercept (ENBREL MINI) 50 MG/ML  injection Inject 1 mL (50 mg total) into the skin once a week. 12 mL 0    ezetimibe (ZETIA) 10 MG tablet Take 1 tablet (10 mg total) by mouth daily. 90 tablet 3    fenofibrate micronized (LOFIBRA) 200 MG capsule TAKE 1 CAPSULE DAILY BEFOREBREAKFAST 90 capsule 3    finasteride (PROSCAR) 5 MG tablet Take 1 tablet (5 mg total) by mouth daily. 90 tablet 3    fluticasone (FLONASE) 50 MCG/ACT nasal spray Place 2 sprays into both nostrils daily. 16 g 6    HYDROcodone-acetaminophen (NORCO) 7.5-325 MG tablet Take 1 tablet by mouth every 6 (six) hours as needed for moderate pain (pain score 4-6). 30 tablet 0    HYDROcodone-acetaminophen (NORCO) 7.5-325 MG tablet Take 1 tablet by mouth every 6 (six) hours as needed for moderate pain (pain score 4-6). 30 tablet 0    HYDROcodone-acetaminophen (NORCO) 7.5-325 MG tablet Take 1 tablet by mouth every 6 (six) hours as needed for moderate pain (pain score 4-6). 30 tablet 0    insulin aspart (NOVOLOG FLEXPEN) 100 UNIT/ML FlexPen Inject 0-20 Units into the skin See admin instructions. Inject 0-10 units into the skin before meals, PER SLIDING SCALE: BGL 0-200 = give nothing, 201-250 = 2 units, 251-300 = 4 units, 301-350 = 6 units, 351-400 = 8 units, >400 = 10 units, push fluids, and repeat BGL check in 2 hours. If BGL remains >400 after 2 hours, CALL MD.      insulin degludec (TRESIBA FLEXTOUCH) 100 UNIT/ML FlexTouch Pen Inject 30 Units into the skin daily.      leflunomide (ARAVA) 10 MG tablet TAKE 1 TABLET DAILY 90 tablet 0    lisinopril (ZESTRIL) 40 MG tablet Take 1 tablet (40 mg total) by mouth in the morning. 90 tablet 3    magnesium oxide (MAG-OX) 400 MG tablet Take 400 mg by mouth daily with lunch.      melatonin 5 MG TABS Take 5 mg by mouth at bedtime.      Multiple Vitamins-Minerals (MULTIVITAMIN WITH MINERALS) tablet Take 1 tablet by mouth daily with breakfast.      Omega 3 1000 MG CAPS Take 2,000 mg by mouth daily with lunch.      omeprazole (PRILOSEC) 20 MG  capsule Take 1 capsule (20 mg total) by mouth daily. 90 capsule 3    ONETOUCH VERIO test strip 1 each by Other route as needed for other (Glucose).      Potassium 99 MG TABS Take 99 mg by mouth every evening.      vitamin B-12 (CYANOCOBALAMIN) 1000 MCG tablet Take 1,000 mcg by mouth daily.      vitamin E 180 MG (400 UNITS) capsule Take 400 Units by mouth daily.      Scheduled:  sodium chloride   Intravenous Once   escitalopram  20 mg Oral Daily   ezetimibe  10 mg Oral Daily   fenofibrate  54 mg Oral Daily   finasteride  5 mg Oral Daily   insulin aspart  0-15 Units Subcutaneous TID WC   insulin aspart  0-5 Units Subcutaneous QHS   insulin glargine-yfgn  10 Units Subcutaneous QHS   melatonin  5 mg Oral QHS   pantoprazole  40 mg Oral Daily   Continuous:  sodium chloride 100 mL/hr at 01/15/24 1610   [START ON 01/16/2024] cefTRIAXone (ROCEPHIN)  IV     Results for orders placed or performed during the hospital encounter of 01/15/24 (from the past 48 hours)  CBG monitoring, ED     Status: Abnormal   Collection Time: 01/15/24  9:25 AM  Result Value Ref Range   Glucose-Capillary 118 (H) 70 - 99 mg/dL    Comment: Glucose reference range applies only to samples taken after fasting for at least 8 hours.  Basic metabolic panel     Status: Abnormal   Collection Time: 01/15/24  9:31 AM  Result Value Ref Range   Sodium 131 (L) 135 - 145 mmol/L   Potassium 4.7 3.5 - 5.1 mmol/L   Chloride 102 98 - 111 mmol/L   CO2 17 (L) 22 - 32 mmol/L   Glucose, Bld 122 (H) 70 - 99 mg/dL    Comment: Glucose reference range applies only to samples taken after fasting for at least 8 hours.   BUN 49 (H) 8 - 23 mg/dL   Creatinine, Ser 1.61 (H) 0.61 - 1.24 mg/dL   Calcium 8.6 (L) 8.9 - 10.3 mg/dL   GFR, Estimated 26 (L) >60 mL/min    Comment: (NOTE) Calculated using the CKD-EPI Creatinine Equation (2021)    Anion gap 12 5 - 15    Comment: Performed at [facility], [address] [place],  [place] [address]  CBC with Differential     Status: Abnormal   Collection Time: 01/15/24  9:31 AM  Result Value Ref Range   WBC 11.2 (H) 4.0 - 10.5 K/uL   RBC 2.58 (L) 4.22 - 5.81 MIL/uL   Hemoglobin 6.5 (LL) 13.0 - 17.0 g/dL    Comment: REPEATED TO VERIFY Reticulocyte Hemoglobin testing may be clinically indicated, consider ordering this additional test [id number] THIS CRITICAL RESULT HAS VERIFIED AND BEEN CALLED TO SOPHIE GOUCH, RN BY MARY GRAMINSKI ON 03 30 2025 AT 1031, AND HAS BEEN READ BACK.     HCT 20.0 (L) 39.0 - 52.0 %   MCV 77.5 (L) 80.0 - 100.0 fL   MCH 25.2 (L) 26.0 - 34.0 pg   MCHC 32.5 30.0 - 36.0 g/dL   RDW 14.7 (H) 82.9 - 56.2 %   Platelets 433 (H) 150 - 400 K/uL   nRBC 0.0 0.0 - 0.2 %   Neutrophils Relative % 65 %   Neutro Abs 7.4 1.7 - 7.7 K/uL  Lymphocytes Relative 25 %   Lymphs Abs 2.8 0.7 - 4.0 K/uL   Monocytes Relative 8 %   Monocytes Absolute 0.8 0.1 - 1.0 K/uL   Eosinophils Relative 1 %   Eosinophils Absolute 0.1 0.0 - 0.5 K/uL   Basophils Relative 0 %   Basophils Absolute 0.1 0.0 - 0.1 K/uL   Immature Granulocytes 1 %   Abs Immature Granulocytes 0.06 0.00 - 0.07 K/uL    Comment: Performed at [facility], [address] [place], [place] [address]  Urinalysis, w/ Reflex to Culture (Infection Suspected) -Urine, Clean Catch     Status: Abnormal   Collection Time: 01/15/24  9:31 AM  Result Value Ref Range   Specimen Source URINE, CLEAN CATCH    Color, Urine YELLOW YELLOW   APPearance CLOUDY (A) CLEAR   Specific Gravity, Urine 1.010 1.005 - 1.030   pH 5.5 5.0 - 8.0   Glucose, UA NEGATIVE NEGATIVE mg/dL   Hgb urine dipstick TRACE (A) NEGATIVE   Bilirubin Urine NEGATIVE NEGATIVE   Ketones, ur NEGATIVE NEGATIVE mg/dL   Protein, ur 30 (A) NEGATIVE mg/dL   Nitrite NEGATIVE NEGATIVE   Leukocytes,Ua LARGE (A) NEGATIVE   Squamous Epithelial / HPF 0-5 0 - 5 /HPF   WBC, UA >50 0 - 5 WBC/hpf    Comment: Reflex urine culture not performed if WBC <=10, OR  if Squamous epithelial cells >5. If Squamous epithelial cells >5, suggest recollection.   RBC / HPF 0-5 0 - 5 RBC/hpf   Bacteria, UA MANY (A) NONE SEEN    Comment: Performed at [facility], [address] [place], [place] [address]  Lactic acid, plasma     Status: None   Collection Time: 01/15/24 10:05 AM  Result Value Ref Range   Lactic Acid, Venous 1.2 0.5 - 1.9 mmol/L    Comment: Performed at [facility], [address] [place], [place] [address]  Occult blood card to lab, stool     Status: Abnormal   Collection Time: 01/15/24 12:00 PM  Result Value Ref Range   Fecal Occult Bld POSITIVE (A) NEGATIVE    Comment: Performed at [facility], [address] [place], [place] [address]  Lactic acid, plasma     Status: None   Collection Time: 01/15/24 12:11 PM  Result Value Ref Range   Lactic Acid, Venous 1.0 0.5 - 1.9 mmol/L    Comment: Performed at [facility], [address] [place], [place] [address]  Hemoglobin and hematocrit, blood     Status: Abnormal   Collection Time: 01/15/24  1:59 PM  Result Value Ref Range   Hemoglobin 6.1 (LL) 13.0 - 17.0 g/dL    Comment: REPEATED TO VERIFY THIS CRITICAL RESULT HAS VERIFIED AND BEEN CALLED TO GOUGE, S, RN BY JOSH BOWLBY ON 03 30 2025 AT 1414, AND HAS BEEN READ BACK.     HCT 18.1 (L) 39.0 - 52.0 %    Comment: Performed at [facility], [address] [place], [place] [address]   CT CHEST ABDOMEN PELVIS WO CONTRAST Result Date: 01/15/2024 CLINICAL DATA:  Abdominal trauma, blunt. Multiple falls in last 2 weeks. Worsening urinary frequency. Dysuria. * Tracking Code: BO * EXAM: CT CHEST, ABDOMEN AND PELVIS WITHOUT CONTRAST TECHNIQUE: Multidetector CT imaging of the chest, abdomen and pelvis was performed following the standard protocol without IV contrast. RADIATION DOSE REDUCTION: This exam was performed according  to the departmental dose-optimization program which includes automated  exposure control, adjustment of the mA and/or kV according to patient size and/or use of iterative reconstruction technique. COMPARISON:  CT scan chest, abdomen and pelvis from 12/18/2021 and CT scan abdomen and pelvis from 08/21/2023 FINDINGS: CT CHEST FINDINGS Cardiovascular: Normal cardiac size. No pericardial effusion. No aortic aneurysm. There are coronary artery calcifications, in keeping with coronary artery disease. Prosthetic aortic valve noted. There is dense mitral annulus calcification. Mediastinum/Nodes: Visualized thyroid gland appears grossly unremarkable. No solid / cystic mediastinal masses. The esophagus is nondistended precluding optimal assessment. No mediastinal or axillary lymphadenopathy by size criteria. Evaluation of bilateral hila is limited due to lack on intravenous contrast: however, no large hilar lymphadenopathy identified. Lungs/Pleura: The central tracheo-bronchial tree is patent. There are patchy areas of linear, plate-like atelectasis and/or scarring throughout bilateral lungs. No mass or consolidation. No pleural effusion or pneumothorax. There is a subpleural solid noncalcified 6 x 7 mm nodule in the left lung lower lobe (series 312, image 58), which is grossly unchanged since the prior study from 08/21/2023 but new since the prior study from 12/18/2021. There is a stable 2 mm calcified granuloma in the left lung lower lobe. Musculoskeletal: The visualized soft tissues of the chest wall are grossly unremarkable. No suspicious osseous lesions. There are mild multilevel degenerative changes in the visualized spine. CT ABDOMEN PELVIS FINDINGS Hepatobiliary: The liver is normal in size. Non-cirrhotic configuration. There is a new ill-defined heterogeneous hypoattenuating approximately 4.2 x 4.8 cm mass in the right hepatic lobe, segment 5/6, which is new since the prior study and highly concerning for neoplastic process. There is an additional smaller approximately 1 cm sized  hypoattenuating structure in the right hepatic lobe, segment 8, which is also concerning for neoplastic process. Further evaluation with multiphasic contrast-enhanced MRI or CT scan abdomen as per liver mass protocol is recommended. No intrahepatic or extrahepatic bile duct dilation. Gallbladder is surgically absent. Pancreas: Unremarkable. No pancreatic ductal dilatation or surrounding inflammatory changes. Spleen: Within normal limits. No focal lesion. Adrenals/Urinary Tract: Adrenal glands are unremarkable. No suspicious renal mass within the limitations of this unenhanced exam. There is a nearly completely exophytic 2.1 x 2.3 cm structure arising from the left kidney upper pole, with internal CT attenuation of 29 Hounsfield units. This is incompletely characterized on this unenhanced exam but is present since the prior study and favored to represent a proteinaceous/hemorrhagic cyst. This can also be better characterized on the MRI exam. No nephroureterolithiasis or obstructive uropathy. Unremarkable urinary bladder. Stomach/Bowel: There is a small sliding hiatal hernia. No disproportionate dilation of the small or large bowel loops. No evidence of abnormal bowel wall thickening or inflammatory changes. The appendix is unremarkable. There are multiple diverticula mainly in the left hemi colon, without imaging signs of diverticulitis. Vascular/Lymphatic: No ascites or pneumoperitoneum. There is an irregular approximately 1.4 x 1.7 cm soft tissue attenuation nodule in the right upper quadrant abutting the right inferior liver surface, favored to represent metastatic deposit. No abdominal or pelvic lymphadenopathy, by size criteria. No aneurysmal dilation of the major abdominal arteries. There are minimal peripheral atherosclerotic vascular calcifications of the aorta and its major branches. Reproductive: Normal size prostate. Symmetric seminal vesicles. Other: There are bilateral small fat containing inguinal  hernias. The soft tissues and abdominal wall are otherwise unremarkable. Musculoskeletal: There is a subcentimeter sized lytic lesion in the right iliac bone (610, image 95), which is incompletely characterized on the current exam but present since the prior  study from March 2023 and favored benign. There are mild multilevel degenerative changes in the visualized spine. Lower thoracic, lumbar and upper sacral spinal fixation hardware noted including bilateral SI joint arthrodesis. IMPRESSION: 1. No acute traumatic injury to the chest, abdomen or pelvis. 2. There is a new ill-defined heterogeneous hypoattenuating approximately 4.2 x 4.8 cm mass in the right hepatic lobe, segment 5/6, which is new since the prior study and highly concerning for neoplastic process. There is an additional smaller approximately 1 cm sized hypoattenuating structure in the right hepatic lobe, segment 8, which is also concerning for neoplastic process. Further evaluation with multiphasic contrast-enhanced MRI or CT scan abdomen as per liver mass protocol is recommended. 3. There is an irregular approximately 1.4 x 1.7 cm soft tissue attenuation nodule in the right upper quadrant abutting the right inferior liver surface, favored to represent metastatic deposit. 4. There is a subpleural solid noncalcified 6 x 7 mm nodule in the left lung lower lobe, which is grossly unchanged since the prior study but new since the prior study from 12/18/2021. This is indeterminate. 5. Multiple other nonacute observations, as described above. Aortic Atherosclerosis (ICD10-I70.0). Electronically Signed   By: Jules Schick M.D.   On: 01/15/2024 11:56   CT L-SPINE NO CHARGE Result Date: 01/15/2024 CLINICAL DATA:  Worsening urinary frequency since Wednesday. Dysuria. History of 4 falls in the last 2 weeks. EXAM: CT Lumbar Spine without contrast TECHNIQUE: Technique: Multiplanar CT images of the lumbar spine were reconstructed from contemporary CT of the  Abdomen and Pelvis. RADIATION DOSE REDUCTION: This exam was performed according to the departmental dose-optimization program which includes automated exposure control, adjustment of the mA and/or kV according to patient size and/or use of iterative reconstruction technique. CONTRAST:  None COMPARISON:  Lumbar radiography 03/30/2020 FINDINGS: Segmentation: 5 lumbar type vertebrae Alignment: No acute malalignment.  Dextroscoliosis. Vertebrae: Thoracic (at least T10) to pelvic fusion solid arthrodesis is seen at L3-L5. There is vacuum phenomenon in the L5-S1 disc space with lucency around the S1 and pelvic screws. Intervertebral gas also seen at upper lumbar levels including L2-3. No acute fracture or aggressive bone lesion. Hip osteoarthritis with joint space narrowing on the left more than right. Paraspinal and other soft tissues: Extensive postoperative scarring and fatty muscular atrophy at levels of broad laminectomy in the lumbar spine. Disc levels: No high-grade bony spinal stenosis is seen after laminectomy. Degenerative foraminal impingement on the left at L2-3 which appears advanced. Other foraminal narrowings are mild-to-moderate. IMPRESSION: 1. No acute finding. 2. 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 3. Advanced degeneration with multilevel laminectomy. Electronically Signed   By: Tiburcio Pea M.D.   On: 01/15/2024 11:44   CT Head Wo Contrast Result Date: 01/15/2024 CLINICAL DATA:  Head trauma, minor (Age >= 65y); Neck trauma (Age >= 65y); Facial trauma, blunt EXAM: CT HEAD WITHOUT CONTRAST CT MAXILLOFACIAL WITHOUT CONTRAST CT CERVICAL SPINE WITHOUT CONTRAST TECHNIQUE: Multidetector CT imaging of the head, cervical spine, and maxillofacial structures were performed using the standard protocol without intravenous contrast. Multiplanar CT image reconstructions of the cervical spine and maxillofacial structures were also generated. RADIATION DOSE REDUCTION: This  exam was performed according to the departmental dose-optimization program which includes automated exposure control, adjustment of the mA and/or kV according to patient size and/or use of iterative reconstruction technique. COMPARISON:  12/09/2021 FINDINGS: CT HEAD FINDINGS Brain: No evidence of acute infarction, hemorrhage, hydrocephalus, extra-axial collection or mass lesion/mass effect. Patchy low-density changes within the periventricular  and subcortical white matter most compatible with chronic microvascular ischemic change. Moderate diffuse cerebral volume loss. Vascular: No hyperdense vessel or unexpected calcification. Skull: Prior left parietal craniotomy.  No acute calvarial fracture. Other: Negative for scalp hematoma. CT MAXILLOFACIAL FINDINGS Osseous: No acute maxillofacial bone fracture. Bony orbital walls are intact. Mandible intact. Temporomandibular joints are aligned without dislocation. Orbits: Negative. No traumatic or inflammatory finding. Sinuses: Clear. Soft tissues: Negative. CT CERVICAL SPINE FINDINGS Alignment: Facet joints are aligned without dislocation or traumatic listhesis. Dens and lateral masses are aligned. Skull base and vertebrae: No acute fracture. No primary bone lesion or focal pathologic process. Soft tissues and spinal canal: No prevertebral fluid or swelling. No visible canal hematoma. Disc levels:  Moderate multilevel cervical spondylosis. Upper chest: Included lung apices are clear. Other: Bilateral carotid atherosclerosis. IMPRESSION: 1. No acute intracranial abnormality. 2. No acute maxillofacial bone fracture. 3. No acute fracture or subluxation of the cervical spine. Electronically Signed   By: Duanne Guess D.O.   On: 01/15/2024 11:42   CT Cervical Spine Wo Contrast Result Date: 01/15/2024 CLINICAL DATA:  Head trauma, minor (Age >= 65y); Neck trauma (Age >= 65y); Facial trauma, blunt EXAM: CT HEAD WITHOUT CONTRAST CT MAXILLOFACIAL WITHOUT CONTRAST CT CERVICAL  SPINE WITHOUT CONTRAST TECHNIQUE: Multidetector CT imaging of the head, cervical spine, and maxillofacial structures were performed using the standard protocol without intravenous contrast. Multiplanar CT image reconstructions of the cervical spine and maxillofacial structures were also generated. RADIATION DOSE REDUCTION: This exam was performed according to the departmental dose-optimization program which includes automated exposure control, adjustment of the mA and/or kV according to patient size and/or use of iterative reconstruction technique. COMPARISON:  12/09/2021 FINDINGS: CT HEAD FINDINGS Brain: No evidence of acute infarction, hemorrhage, hydrocephalus, extra-axial collection or mass lesion/mass effect. Patchy low-density changes within the periventricular and subcortical white matter most compatible with chronic microvascular ischemic change. Moderate diffuse cerebral volume loss. Vascular: No hyperdense vessel or unexpected calcification. Skull: Prior left parietal craniotomy.  No acute calvarial fracture. Other: Negative for scalp hematoma. CT MAXILLOFACIAL FINDINGS Osseous: No acute maxillofacial bone fracture. Bony orbital walls are intact. Mandible intact. Temporomandibular joints are aligned without dislocation. Orbits: Negative. No traumatic or inflammatory finding. Sinuses: Clear. Soft tissues: Negative. CT CERVICAL SPINE FINDINGS Alignment: Facet joints are aligned without dislocation or traumatic listhesis. Dens and lateral masses are aligned. Skull base and vertebrae: No acute fracture. No primary bone lesion or focal pathologic process. Soft tissues and spinal canal: No prevertebral fluid or swelling. No visible canal hematoma. Disc levels:  Moderate multilevel cervical spondylosis. Upper chest: Included lung apices are clear. Other: Bilateral carotid atherosclerosis. IMPRESSION: 1. No acute intracranial abnormality. 2. No acute maxillofacial bone fracture. 3. No acute fracture or subluxation  of the cervical spine. Electronically Signed   By: Duanne Guess D.O.   On: 01/15/2024 11:42   CT Maxillofacial Wo Contrast Result Date: 01/15/2024 CLINICAL DATA:  Head trauma, minor (Age >= 65y); Neck trauma (Age >= 65y); Facial trauma, blunt EXAM: CT HEAD WITHOUT CONTRAST CT MAXILLOFACIAL WITHOUT CONTRAST CT CERVICAL SPINE WITHOUT CONTRAST TECHNIQUE: Multidetector CT imaging of the head, cervical spine, and maxillofacial structures were performed using the standard protocol without intravenous contrast. Multiplanar CT image reconstructions of the cervical spine and maxillofacial structures were also generated. RADIATION DOSE REDUCTION: This exam was performed according to the departmental dose-optimization program which includes automated exposure control, adjustment of the mA and/or kV according to patient size and/or use of iterative reconstruction technique. COMPARISON:  12/09/2021 FINDINGS: CT HEAD FINDINGS Brain: No evidence of acute infarction, hemorrhage, hydrocephalus, extra-axial collection or mass lesion/mass effect. Patchy low-density changes within the periventricular and subcortical white matter most compatible with chronic microvascular ischemic change. Moderate diffuse cerebral volume loss. Vascular: No hyperdense vessel or unexpected calcification. Skull: Prior left parietal craniotomy.  No acute calvarial fracture. Other: Negative for scalp hematoma. CT MAXILLOFACIAL FINDINGS Osseous: No acute maxillofacial bone fracture. Bony orbital walls are intact. Mandible intact. Temporomandibular joints are aligned without dislocation. Orbits: Negative. No traumatic or inflammatory finding. Sinuses: Clear. Soft tissues: Negative. CT CERVICAL SPINE FINDINGS Alignment: Facet joints are aligned without dislocation or traumatic listhesis. Dens and lateral masses are aligned. Skull base and vertebrae: No acute fracture. No primary bone lesion or focal pathologic process. Soft tissues and spinal canal: No  prevertebral fluid or swelling. No visible canal hematoma. Disc levels:  Moderate multilevel cervical spondylosis. Upper chest: Included lung apices are clear. Other: Bilateral carotid atherosclerosis. IMPRESSION: 1. No acute intracranial abnormality. 2. No acute maxillofacial bone fracture. 3. No acute fracture or subluxation of the cervical spine. Electronically Signed   By: Duanne Guess D.O.   On: 01/15/2024 11:42   Review of Systems  Constitutional:  Positive for activity change and unexpected weight change. Negative for appetite change, chills, diaphoresis, fatigue and fever.  HENT: Negative.    Eyes: Negative.   Respiratory: Negative.    Cardiovascular: Negative.   Gastrointestinal:  Positive for abdominal pain. Negative for abdominal distention, anal bleeding, blood in stool, constipation, diarrhea and nausea.  Endocrine: Negative.   Genitourinary: Negative.   Musculoskeletal:  Positive for arthralgias.  Allergic/Immunologic: Negative.   Neurological:  Negative for dizziness, tremors, seizures, syncope, facial asymmetry, speech difficulty, weakness, light-headedness, numbness and headaches.  Hematological:  Bruises/bleeds easily.  Psychiatric/Behavioral:  The patient is nervous/anxious.    Blood pressure (!) 159/68, pulse 75, temperature 98.2 F (36.8 C), temperature source Oral, resp. rate 16, height 5\' 11"  (1.803 m), weight 97 kg, SpO2 100%. Physical Exam Constitutional:      General: He is not in acute distress.    Appearance: Normal appearance. He is not toxic-appearing.  HENT:     Head: Normocephalic.     Comments: Bruises are noted around the patient's right eye from a recent fall; he had a previous craniotomy for drainage of a subdural hematoma Eyes:     General: Lids are normal.  Cardiovascular:     Rate and Rhythm: Normal rate and regular rhythm.  Pulmonary:     Effort: Pulmonary effort is normal.  Abdominal:     General: Abdomen is protuberant. Bowel sounds are  normal. There is no distension or abdominal bruit. There are no signs of injury.     Tenderness: There is abdominal tenderness in the left lower quadrant.     Hernia: No hernia is present.     Comments: Laparoscopic surgical scars are noted on the abdomen from a previous cholecystectomy  Musculoskeletal:     Cervical back: Normal range of motion and neck supple.  Skin:    Coloration: Skin is pale.     Findings: Ecchymosis present.     Comments: Right eye  Neurological:     Mental Status: He is alert.  Psychiatric:        Attention and Perception: Attention and perception normal.        Speech: Speech normal.        Behavior: Behavior normal. Behavior is cooperative.  Thought Content: Thought content normal.   Assessment/Plan: 1) Acute on chronic iron deficiency anemia with guaiac positive stools-patient denies any recent use of nonsteroidals except for Aspirin-will need an EGD and possibly colonoscopy for further evaluation of the anemia. 2) Abnormal CT scan with multiple hypodense lesions in the liver-he will need an MRI evaluation of these lesions. 3) GERD/Hiatal hernia-on Omeprazole at home  4) Abnormal weight loss worrisome for malignancy. 5) Colonic diverticulosis/colonic polyps-tubular adenomas. 6) History of anxiety and depression. 7) IDDM. 8) Rheumatoid arthritis. 9) History of CVA 10) Hyperlipidemia 11) History of malignant melanoma Tommy Green 01/15/2024, 4:19 PM

## 2024-01-15 NOTE — H&P (Addendum)
 History and Physical  Tommy Green ZOX:[id number] DOB: 10-21-1945 DOA: 01/15/2024  PCP: [name], DO   Chief Complaint: Falls at home  HPI: Tommy Green is a 78 y.o. male with medical history significant for malignant melanoma, insulin-dependent type 2 diabetes, rheumatoid arthritis, GERD, history of CVA, hyperlipidemia being admitted to the hospital with acute blood loss anemia and multiple falls at home.  History provided by the patient, who tells me that he has fallen multiple times at home.  He feels these are mechanical falls, he denies any orthostasis, or dizziness.  Takes aspirin at home, not on any blood thinners.  He denies to be any dark stools, or any blood in his stool.  Wife brought him to the emergency department due to multiple falls in the last couple of weeks, and dysuria.  Apparently he had outpatient urine culture done, oral antibiotics were called in but family was not notified and so these were not picked up.  On workup in the emergency department, he does have evidence of UTI, as well as acute anemia.  Patient was accepted to [facility] for admission, in the meantime ER provider has also discussed with Dr. Loreta Ave of gastroenterology who will consult.  Review of Systems: Please see HPI for pertinent positives and negatives. A complete 10 system review of systems are otherwise negative.  Past Medical History:  Diagnosis Date   Anemia    Essential hypertension 07/18/2017   Foot drop, left foot    due to back surgery in 01/2021   GERD (gastroesophageal reflux disease)    History of colonic polyps    History of hiatal hernia    many years ago   History of rheumatic fever    History of stroke 07/18/2017   Hypertriglyceridemia 07/18/2017   S/P TAVR (transcatheter aortic valve replacement) 01/26/2022   Edwards S3UR 29mm via TF approach with Dr. Lynnette Caffey and Dr. Laneta Simmers   Severe aortic stenosis    Type 2 diabetes mellitus with diabetic neuropathy,  unspecified (HCC) 07/18/2017   Past Surgical History:  Procedure Laterality Date   COLONOSCOPY     around 2018 [facility]   COLONOSCOPY  09/02/2020   CRANIOTOMY Left 10/25/2019   Procedure: CRANIOTOMY HEMATOMA EVACUATION SUBDURAL;  Surgeon: Bedelia Person, MD;  Location: [facility] OR;  Service: Neurosurgery;  Laterality: Left;   ESOPHAGOGASTRODUODENOSCOPY     around 2018 with [facility]   FOOT CAPSULOTOMY Left 07/25/2008   Mid Foot #2 MPJ   Hammertoe Repair Left 07/25/2008   #2 toe   HAND SURGERY     nodule removed from left thumb and right index finger, Dr. Judithann Sheen   INTRAOPERATIVE TRANSTHORACIC ECHOCARDIOGRAM N/A 01/26/2022   Procedure: INTRAOPERATIVE TRANSTHORACIC ECHOCARDIOGRAM;  Surgeon: Orbie Pyo, MD;  Location: [facility] OR;  Service: Open Heart Surgery;  Laterality: N/A;   MOHS SURGERY  01/25/2023   right arm   RIGHT/LEFT HEART CATH AND CORONARY ANGIOGRAPHY N/A 12/11/2021   Procedure: RIGHT/LEFT HEART CATH AND CORONARY ANGIOGRAPHY;  Surgeon: Corky Crafts, MD;  Location: [facility] INVASIVE CV LAB;  Service: Cardiovascular;  Laterality: N/A;   SPINAL FUSION  01/2021   TARSAL TUNNEL RELEASE Left 07/25/2008   TONSILLECTOMY     removed as a child   TRANSCATHETER AORTIC VALVE REPLACEMENT, TRANSFEMORAL N/A 01/26/2022   Procedure: Transcatheter Aortic Valve Replacement , Transfemoral;  Surgeon: Orbie Pyo, MD;  Location: [facility] OR;  Service: Open Heart Surgery;  Laterality: N/A;  Percutaneous   UPPER GASTROINTESTINAL  ENDOSCOPY  09/02/2020   Social History:  reports that he quit smoking about 52 years ago. His smoking use included cigarettes. He started smoking about 62 years ago. He has a 10 pack-year smoking history. He has been exposed to tobacco smoke. He has never used smokeless tobacco. He reports that he does not currently use alcohol. He reports that he does not use drugs.  Allergies  Allergen Reactions   Canagliflozin Other (See Comments)    Pt reports that the  use of Invokana caused acute KIDNEY FAILURE ([facility] records) and "allergic," per [facility]   Aspirin Other (See Comments)    Gastroesophageal reflux and "allergic," per [facility]    Atorvastatin Other (See Comments)    Myalgias, GI upset, and "allergic," per [facility]   Statins Other (See Comments)    Muscle pain- "allergic," per [facility]    Family History  Problem Relation Age of Onset   Hyperlipidemia Mother    Colon polyps Mother    Hyperlipidemia Father    Alcoholism Brother    Healthy Son    Healthy Son    Cancer Neg Hx    Colon cancer Neg Hx    Esophageal cancer Neg Hx    Rectal cancer Neg Hx    Stomach cancer Neg Hx      Prior to Admission medications   Medication Sig Start Date End Date Taking? Authorizing Provider  acetaminophen (TYLENOL) 325 MG tablet Take 650 mg by mouth every 6 (six) hours as needed for mild pain or headache.    [provider]  amoxicillin (AMOXIL) 500 MG tablet Take 4 tablets (2,000 mg total) by mouth as directed. 1 HOUR PRIOR TO DENTAL APPOINTMENTS 05/18/22   Baldo Daub, MD  aspirin EC 81 MG tablet Take 1 tablet (81 mg total) by mouth daily. Swallow whole. 01/26/23   Georgie Chard D, NP  B Complex Vitamins (B COMPLEX PO) Take 1 tablet by mouth daily.    [provider]  cefdinir (OMNICEF) 300 MG capsule Take 1 capsule (300 mg total) by mouth 2 (two) times daily for 7 days. 01/14/24 01/21/24  [name], DO  Cholecalciferol (VITAMIN D3 SUPER STRENGTH) 50 MCG (2000 UT) CAPS Take 2,000 Units by mouth daily.    [provider]  escitalopram (LEXAPRO) 20 MG tablet Take 1 tablet (20 mg total) by mouth daily. 08/29/23   [name], DO  etanercept (ENBREL MINI) 50 MG/ML injection Inject 1 mL (50 mg total) into the skin once a week. 11/03/23   Gearldine Bienenstock, PA-C  ezetimibe (ZETIA) 10 MG tablet Take 1 tablet (10 mg total) by mouth daily. 11/08/23   [name], DO  fenofibrate micronized (LOFIBRA) 200 MG capsule TAKE 1  CAPSULE DAILY BEFOREBREAKFAST 06/17/23   [name], DO  finasteride (PROSCAR) 5 MG tablet Take 1 tablet (5 mg total) by mouth daily. 02/07/23   Joline Maxcy, MD  fluticasone (FLONASE) 50 MCG/ACT nasal spray Place 2 sprays into both nostrils daily. 06/16/22   [name], DO  HYDROcodone-acetaminophen (NORCO) 7.5-325 MG tablet Take 1 tablet by mouth every 6 (six) hours as needed for moderate pain (pain score 4-6). 12/15/23   [name], DO  HYDROcodone-acetaminophen (NORCO) 7.5-325 MG tablet Take 1 tablet by mouth every 6 (six) hours as needed for moderate pain (pain score 4-6). 11/15/23   [name], DO  HYDROcodone-acetaminophen (NORCO) 7.5-325 MG tablet Take 1 tablet by mouth every 6 (six) hours as needed  for moderate pain (pain score 4-6). 01/14/24   [name], DO  insulin aspart (NOVOLOG FLEXPEN) 100 UNIT/ML FlexPen Inject 0-20 Units into the skin See admin instructions. Inject 0-10 units into the skin before meals, PER SLIDING SCALE: BGL 0-200 = give nothing, 201-250 = 2 units, 251-300 = 4 units, 301-350 = 6 units, 351-400 = 8 units, >400 = 10 units, push fluids, and repeat BGL check in 2 hours. If BGL remains >400 after 2 hours, CALL MD.    [provider]  insulin degludec (TRESIBA FLEXTOUCH) 100 UNIT/ML FlexTouch Pen Inject 30 Units into the skin daily.    [provider]  leflunomide (ARAVA) 10 MG tablet TAKE 1 TABLET DAILY 09/28/23   Pollyann Savoy, MD  lisinopril (ZESTRIL) 40 MG tablet Take 1 tablet (40 mg total) by mouth in the morning. 10/05/23   [name], DO  magnesium oxide (MAG-OX) 400 MG tablet Take 400 mg by mouth daily with lunch.    [provider]  melatonin 5 MG TABS Take 5 mg by mouth at bedtime.    [provider]  Multiple Vitamins-Minerals (MULTIVITAMIN WITH MINERALS) tablet Take 1 tablet by mouth daily with breakfast.    [provider]  Omega 3 1000  MG CAPS Take 2,000 mg by mouth daily with lunch.    [provider]  omeprazole (PRILOSEC) 20 MG capsule Take 1 capsule (20 mg total) by mouth daily. 06/08/23   [name], DO  [facility] VERIO test strip 1 each by Other route as needed for other (Glucose). 06/14/20   [provider]  Potassium 99 MG TABS Take 99 mg by mouth every evening.    [provider]  vitamin B-12 (CYANOCOBALAMIN) 1000 MCG tablet Take 1,000 mcg by mouth daily.    [provider]  vitamin E 180 MG (400 UNITS) capsule Take 400 Units by mouth daily.    [provider]    Physical Exam: BP (!) 159/68 (BP Location: Left Arm)   Pulse 75   Temp 98.2 F (36.8 C) (Oral)   Resp 16   Ht 5\' 11"  (1.803 m)   Wt 97 kg   SpO2 100%   BMI 29.82 kg/m  General:  Alert, oriented, calm, disheveled, resting on room air not in any acute distress Cardiovascular: RRR, no murmurs or rubs, no peripheral edema  Respiratory: clear to auscultation bilaterally, no wheezes, no crackles  Abdomen: soft, nontender, distended, normal bowel tones heard  Skin: dry, no rashes  Musculoskeletal: no joint effusions, normal range of motion  Psychiatric: appropriate affect, normal speech  Neurologic: extraocular muscles intact, clear speech, moving all extremities with intact sensorium         Labs on Admission:  Basic Metabolic Panel: Recent Labs  Lab 01/15/24 0931  NA 131*  K 4.7  CL 102  CO2 17*  GLUCOSE 122*  BUN 49*  CREATININE 2.49*  CALCIUM 8.6*   Liver Function Tests: No results for input(s): "AST", "ALT", "ALKPHOS", "BILITOT", "PROT", "ALBUMIN" in the last 168 hours. No results for input(s): "LIPASE", "AMYLASE" in the last 168 hours. No results for input(s): "AMMONIA" in the last 168 hours. CBC: Recent Labs  Lab 01/15/24 0931 01/15/24 1359  WBC 11.2*  --   NEUTROABS 7.4  --   HGB 6.5* 6.1*  HCT 20.0* 18.1*  MCV 77.5*  --   PLT 433*  --    Cardiac Enzymes: No  results for input(s): "CKTOTAL", "CKMB", "CKMBINDEX", "TROPONINI" in  the last 168 hours. BNP (last 3 results) No results for input(s): "BNP" in the last 8760 hours.  ProBNP (last 3 results) No results for input(s): "PROBNP" in the last 8760 hours.  CBG: Recent Labs  Lab 01/15/24 0925  GLUCAP 118*    Radiological Exams on Admission: CT CHEST ABDOMEN PELVIS WO CONTRAST Result Date: 01/15/2024 CLINICAL DATA:  Abdominal trauma, blunt. Multiple falls in last 2 weeks. Worsening urinary frequency. Dysuria. * Tracking Code: BO * EXAM: CT CHEST, ABDOMEN AND PELVIS WITHOUT CONTRAST TECHNIQUE: Multidetector CT imaging of the chest, abdomen and pelvis was performed following the standard protocol without IV contrast. RADIATION DOSE REDUCTION: This exam was performed according to the departmental dose-optimization program which includes automated exposure control, adjustment of the mA and/or kV according to patient size and/or use of iterative reconstruction technique. COMPARISON:  CT scan chest, abdomen and pelvis from 12/18/2021 and CT scan abdomen and pelvis from 08/21/2023 FINDINGS: CT CHEST FINDINGS Cardiovascular: Normal cardiac size. No pericardial effusion. No aortic aneurysm. There are coronary artery calcifications, in keeping with coronary artery disease. Prosthetic aortic valve noted. There is dense mitral annulus calcification. Mediastinum/Nodes: Visualized thyroid gland appears grossly unremarkable. No solid / cystic mediastinal masses. The esophagus is nondistended precluding optimal assessment. No mediastinal or axillary lymphadenopathy by size criteria. Evaluation of bilateral hila is limited due to lack on intravenous contrast: however, no large hilar lymphadenopathy identified. Lungs/Pleura: The central tracheo-bronchial tree is patent. There are patchy areas of linear, plate-like atelectasis and/or scarring throughout bilateral lungs. No mass or consolidation. No pleural effusion or  pneumothorax. There is a subpleural solid noncalcified 6 x 7 mm nodule in the left lung lower lobe (series 312, image 58), which is grossly unchanged since the prior study from 08/21/2023 but new since the prior study from 12/18/2021. There is a stable 2 mm calcified granuloma in the left lung lower lobe. Musculoskeletal: The visualized soft tissues of the chest wall are grossly unremarkable. No suspicious osseous lesions. There are mild multilevel degenerative changes in the visualized spine. CT ABDOMEN PELVIS FINDINGS Hepatobiliary: The liver is normal in size. Non-cirrhotic configuration. There is a new ill-defined heterogeneous hypoattenuating approximately 4.2 x 4.8 cm mass in the right hepatic lobe, segment 5/6, which is new since the prior study and highly concerning for neoplastic process. There is an additional smaller approximately 1 cm sized hypoattenuating structure in the right hepatic lobe, segment 8, which is also concerning for neoplastic process. Further evaluation with multiphasic contrast-enhanced MRI or CT scan abdomen as per liver mass protocol is recommended. No intrahepatic or extrahepatic bile duct dilation. Gallbladder is surgically absent. Pancreas: Unremarkable. No pancreatic ductal dilatation or surrounding inflammatory changes. Spleen: Within normal limits. No focal lesion. Adrenals/Urinary Tract: Adrenal glands are unremarkable. No suspicious renal mass within the limitations of this unenhanced exam. There is a nearly completely exophytic 2.1 x 2.3 cm structure arising from the left kidney upper pole, with internal CT attenuation of 29 Hounsfield units. This is incompletely characterized on this unenhanced exam but is present since the prior study and favored to represent a proteinaceous/hemorrhagic cyst. This can also be better characterized on the MRI exam. No nephroureterolithiasis or obstructive uropathy. Unremarkable urinary bladder. Stomach/Bowel: There is a small sliding hiatal  hernia. No disproportionate dilation of the small or large bowel loops. No evidence of abnormal bowel wall thickening or inflammatory changes. The appendix is unremarkable. There are multiple diverticula mainly in the left hemi colon, without imaging signs of diverticulitis. Vascular/Lymphatic: No ascites  or pneumoperitoneum. There is an irregular approximately 1.4 x 1.7 cm soft tissue attenuation nodule in the right upper quadrant abutting the right inferior liver surface, favored to represent metastatic deposit. No abdominal or pelvic lymphadenopathy, by size criteria. No aneurysmal dilation of the major abdominal arteries. There are minimal peripheral atherosclerotic vascular calcifications of the aorta and its major branches. Reproductive: Normal size prostate. Symmetric seminal vesicles. Other: There are bilateral small fat containing inguinal hernias. The soft tissues and abdominal wall are otherwise unremarkable. Musculoskeletal: There is a subcentimeter sized lytic lesion in the right iliac bone (610, image 95), which is incompletely characterized on the current exam but present since the prior study from March 2023 and favored benign. There are mild multilevel degenerative changes in the visualized spine. Lower thoracic, lumbar and upper sacral spinal fixation hardware noted including bilateral SI joint arthrodesis. IMPRESSION: 1. No acute traumatic injury to the chest, abdomen or pelvis. 2. There is a new ill-defined heterogeneous hypoattenuating approximately 4.2 x 4.8 cm mass in the right hepatic lobe, segment 5/6, which is new since the prior study and highly concerning for neoplastic process. There is an additional smaller approximately 1 cm sized hypoattenuating structure in the right hepatic lobe, segment 8, which is also concerning for neoplastic process. Further evaluation with multiphasic contrast-enhanced MRI or CT scan abdomen as per liver mass protocol is recommended. 3. There is an irregular  approximately 1.4 x 1.7 cm soft tissue attenuation nodule in the right upper quadrant abutting the right inferior liver surface, favored to represent metastatic deposit. 4. There is a subpleural solid noncalcified 6 x 7 mm nodule in the left lung lower lobe, which is grossly unchanged since the prior study but new since the prior study from 12/18/2021. This is indeterminate. 5. Multiple other nonacute observations, as described above. Aortic Atherosclerosis (ICD10-I70.0). Electronically Signed   By: Jules Schick M.D.   On: 01/15/2024 11:56   CT L-SPINE NO CHARGE Result Date: 01/15/2024 CLINICAL DATA:  Worsening urinary frequency since Wednesday. Dysuria. History of 4 falls in the last 2 weeks. EXAM: CT Lumbar Spine without contrast TECHNIQUE: Technique: Multiplanar CT images of the lumbar spine were reconstructed from contemporary CT of the Abdomen and Pelvis. RADIATION DOSE REDUCTION: This exam was performed according to the departmental dose-optimization program which includes automated exposure control, adjustment of the mA and/or kV according to patient size and/or use of iterative reconstruction technique. CONTRAST:  None COMPARISON:  Lumbar radiography 03/30/2020 FINDINGS: Segmentation: 5 lumbar type vertebrae Alignment: No acute malalignment.  Dextroscoliosis. Vertebrae: Thoracic (at least T10) to pelvic fusion solid arthrodesis is seen at L3-L5. There is vacuum phenomenon in the L5-S1 disc space with lucency around the S1 and pelvic screws. Intervertebral gas also seen at upper lumbar levels including L2-3. No acute fracture or aggressive bone lesion. Hip osteoarthritis with joint space narrowing on the left more than right. Paraspinal and other soft tissues: Extensive postoperative scarring and fatty muscular atrophy at levels of broad laminectomy in the lumbar spine. Disc levels: No high-grade bony spinal stenosis is seen after laminectomy. Degenerative foraminal impingement on the left at L2-3 which  appears advanced. Other foraminal narrowings are mild-to-moderate. IMPRESSION: 1. No acute finding. 2. 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 3. Advanced degeneration with multilevel laminectomy. Electronically Signed   By: Tiburcio Pea M.D.   On: 01/15/2024 11:44   CT Head Wo Contrast Result Date: 01/15/2024 CLINICAL DATA:  Head trauma, minor (Age >= 65y); Neck trauma (Age >=  65y); Facial trauma, blunt EXAM: CT HEAD WITHOUT CONTRAST CT MAXILLOFACIAL WITHOUT CONTRAST CT CERVICAL SPINE WITHOUT CONTRAST TECHNIQUE: Multidetector CT imaging of the head, cervical spine, and maxillofacial structures were performed using the standard protocol without intravenous contrast. Multiplanar CT image reconstructions of the cervical spine and maxillofacial structures were also generated. RADIATION DOSE REDUCTION: This exam was performed according to the departmental dose-optimization program which includes automated exposure control, adjustment of the mA and/or kV according to patient size and/or use of iterative reconstruction technique. COMPARISON:  12/09/2021 FINDINGS: CT HEAD FINDINGS Brain: No evidence of acute infarction, hemorrhage, hydrocephalus, extra-axial collection or mass lesion/mass effect. Patchy low-density changes within the periventricular and subcortical white matter most compatible with chronic microvascular ischemic change. Moderate diffuse cerebral volume loss. Vascular: No hyperdense vessel or unexpected calcification. Skull: Prior left parietal craniotomy.  No acute calvarial fracture. Other: Negative for scalp hematoma. CT MAXILLOFACIAL FINDINGS Osseous: No acute maxillofacial bone fracture. Bony orbital walls are intact. Mandible intact. Temporomandibular joints are aligned without dislocation. Orbits: Negative. No traumatic or inflammatory finding. Sinuses: Clear. Soft tissues: Negative. CT CERVICAL SPINE FINDINGS Alignment: Facet joints are aligned without  dislocation or traumatic listhesis. Dens and lateral masses are aligned. Skull base and vertebrae: No acute fracture. No primary bone lesion or focal pathologic process. Soft tissues and spinal canal: No prevertebral fluid or swelling. No visible canal hematoma. Disc levels:  Moderate multilevel cervical spondylosis. Upper chest: Included lung apices are clear. Other: Bilateral carotid atherosclerosis. IMPRESSION: 1. No acute intracranial abnormality. 2. No acute maxillofacial bone fracture. 3. No acute fracture or subluxation of the cervical spine. Electronically Signed   By: Duanne Guess D.O.   On: 01/15/2024 11:42   CT Cervical Spine Wo Contrast Result Date: 01/15/2024 CLINICAL DATA:  Head trauma, minor (Age >= 65y); Neck trauma (Age >= 65y); Facial trauma, blunt EXAM: CT HEAD WITHOUT CONTRAST CT MAXILLOFACIAL WITHOUT CONTRAST CT CERVICAL SPINE WITHOUT CONTRAST TECHNIQUE: Multidetector CT imaging of the head, cervical spine, and maxillofacial structures were performed using the standard protocol without intravenous contrast. Multiplanar CT image reconstructions of the cervical spine and maxillofacial structures were also generated. RADIATION DOSE REDUCTION: This exam was performed according to the departmental dose-optimization program which includes automated exposure control, adjustment of the mA and/or kV according to patient size and/or use of iterative reconstruction technique. COMPARISON:  12/09/2021 FINDINGS: CT HEAD FINDINGS Brain: No evidence of acute infarction, hemorrhage, hydrocephalus, extra-axial collection or mass lesion/mass effect. Patchy low-density changes within the periventricular and subcortical white matter most compatible with chronic microvascular ischemic change. Moderate diffuse cerebral volume loss. Vascular: No hyperdense vessel or unexpected calcification. Skull: Prior left parietal craniotomy.  No acute calvarial fracture. Other: Negative for scalp hematoma. CT MAXILLOFACIAL  FINDINGS Osseous: No acute maxillofacial bone fracture. Bony orbital walls are intact. Mandible intact. Temporomandibular joints are aligned without dislocation. Orbits: Negative. No traumatic or inflammatory finding. Sinuses: Clear. Soft tissues: Negative. CT CERVICAL SPINE FINDINGS Alignment: Facet joints are aligned without dislocation or traumatic listhesis. Dens and lateral masses are aligned. Skull base and vertebrae: No acute fracture. No primary bone lesion or focal pathologic process. Soft tissues and spinal canal: No prevertebral fluid or swelling. No visible canal hematoma. Disc levels:  Moderate multilevel cervical spondylosis. Upper chest: Included lung apices are clear. Other: Bilateral carotid atherosclerosis. IMPRESSION: 1. No acute intracranial abnormality. 2. No acute maxillofacial bone fracture. 3. No acute fracture or subluxation of the cervical spine. Electronically Signed   By: Duanne Guess D.O.  On: 01/15/2024 11:42   CT Maxillofacial Wo Contrast Result Date: 01/15/2024 CLINICAL DATA:  Head trauma, minor (Age >= 65y); Neck trauma (Age >= 65y); Facial trauma, blunt EXAM: CT HEAD WITHOUT CONTRAST CT MAXILLOFACIAL WITHOUT CONTRAST CT CERVICAL SPINE WITHOUT CONTRAST TECHNIQUE: Multidetector CT imaging of the head, cervical spine, and maxillofacial structures were performed using the standard protocol without intravenous contrast. Multiplanar CT image reconstructions of the cervical spine and maxillofacial structures were also generated. RADIATION DOSE REDUCTION: This exam was performed according to the departmental dose-optimization program which includes automated exposure control, adjustment of the mA and/or kV according to patient size and/or use of iterative reconstruction technique. COMPARISON:  12/09/2021 FINDINGS: CT HEAD FINDINGS Brain: No evidence of acute infarction, hemorrhage, hydrocephalus, extra-axial collection or mass lesion/mass effect. Patchy low-density changes within  the periventricular and subcortical white matter most compatible with chronic microvascular ischemic change. Moderate diffuse cerebral volume loss. Vascular: No hyperdense vessel or unexpected calcification. Skull: Prior left parietal craniotomy.  No acute calvarial fracture. Other: Negative for scalp hematoma. CT MAXILLOFACIAL FINDINGS Osseous: No acute maxillofacial bone fracture. Bony orbital walls are intact. Mandible intact. Temporomandibular joints are aligned without dislocation. Orbits: Negative. No traumatic or inflammatory finding. Sinuses: Clear. Soft tissues: Negative. CT CERVICAL SPINE FINDINGS Alignment: Facet joints are aligned without dislocation or traumatic listhesis. Dens and lateral masses are aligned. Skull base and vertebrae: No acute fracture. No primary bone lesion or focal pathologic process. Soft tissues and spinal canal: No prevertebral fluid or swelling. No visible canal hematoma. Disc levels:  Moderate multilevel cervical spondylosis. Upper chest: Included lung apices are clear. Other: Bilateral carotid atherosclerosis. IMPRESSION: 1. No acute intracranial abnormality. 2. No acute maxillofacial bone fracture. 3. No acute fracture or subluxation of the cervical spine. Electronically Signed   By: Duanne Guess D.O.   On: 01/15/2024 11:42   Assessment/Plan Tommy Green is a 78 y.o. male with medical history significant for malignant melanoma, insulin-dependent type 2 diabetes, rheumatoid arthritis, GERD, history of CVA, hyperlipidemia being admitted to the hospital with acute blood loss anemia and multiple falls at home.   Acute blood loss anemia-patient without history of melanotic stools or frank bleeding, however his stool is positive for occult blood.  He is on aspirin at home, but no other blood thinners. -Inpatient admission with telemetry -Transfused 2 unit PRBC -Avoid blood thinners -ER provider has consulted GI Dr. Loreta Ave -Clear liquid diet today, will make n.p.o.  after midnight  AKI superimposed on CKD stage IV-I suspect likely ATN from his anemia, as well as in the setting of acute UTI. -Avoid nephrotoxins, hold ACE inhibitor -Transfuse blood, treat UTI -Will hydrate gently with normal saline x 1 L -Follow renal function with daily labs  UTI-growing E. coli, from urine culture obtained 3/26.  Not treated yet as an outpatient. -Continue empiric IV Rocephin  Liver mass-this is particularly concerning given the patient's history of malignant melanoma, he denies any abdominal pain, vomiting, or weight loss recently. -Once renal function is stabilized, he would benefit if possible from contrast CT liver protocol or MRI abdomen  Rheumatoid arthritis-on weekly Enbrel, and Arava.  Will hold these for the time being in the setting of acute infection.  Insulin-dependent type 2 diabetes-we will provide low-dose basal insulin in the setting of GI bleed and clear liquid diet.  Carb modified diet when it is advanced, with moderate dose sliding scale in the meantime.  DVT prophylaxis: SCDs only     Code Status: Full Code  Consults  called: Gastroenterology Dr. Loreta Ave  Admission status: The appropriate patient status for this patient is INPATIENT. Inpatient status is judged to be reasonable and necessary in order to provide the required intensity of service to ensure the patient's safety. The patient's presenting symptoms, physical exam findings, and initial radiographic and laboratory data in the context of their chronic comorbidities is felt to place them at high risk for further clinical deterioration. Furthermore, it is not anticipated that the patient will be medically stable for discharge from the hospital within 2 midnights of admission.    I certify that at the point of admission it is my clinical judgment that the patient will require inpatient hospital care spanning beyond 2 midnights from the point of admission due to high intensity of service, high risk  for further deterioration and high frequency of surveillance required  Time spent: 55 minutes  Nazaire Cordial Sharlette Dense MD [facility] Pager [phone]  If 7PM-7AM, please contact night-coverage [email or web address] Password [email or web address]  01/15/2024, 3:59 PM

## 2024-01-15 NOTE — Progress Notes (Addendum)
 Plan of Care Note for accepted transfer   Patient: Tommy Green MRN: [id number]   DOA: 01/15/2024  Facility requesting transfer: [facility] Requesting Provider: Melina Modena, PA Reason for transfer: Anemia concerning for GIB, urinary tract infection, falls Facility course:  Tommy Green is a 78 year old male with past medical history significant for HTN, DM2, CKD stage IV, BPH, rheumatoid arthritis on Enbrel who presented to [facility] with dysuria, increased urinary frequency, falls.  Workup in the ED notable for hemoglobin 6.5 with baseline around 8, elevated creatinine of 2.49 with baseline 1.5 (08/29/2023) and urinalysis concerning for urinary tract infection.  Concern for GI bleed, not on blood thinner/antiplatelet, acute renal failure on CKD stage IV, and urinary tract infection.  Patient received NS bolus, Protonix, ceftriaxone.  Requested repeat of hemoglobin prior to transfer and GI consultation.  Plan of care: The patient is accepted for admission to Telemetry unit, at [facility]..    Author: Alvira Philips [place], DO 01/15/2024  Check [email or web address] for on-call coverage.  Nursing staff, Please call [facility] Admits & Consults System-Wide number on Amion as soon as patient's arrival, so appropriate admitting provider can evaluate the pt.

## 2024-01-15 NOTE — ED Triage Notes (Signed)
 Reports worsening urinary frequency since Wednesday.  Dysuria, foul smelling. Denies hematuria  Hx of prostate issues   Reports 4 falls in 2 weeks, last fall on Monday, seen by EMS. Intermittent headaches. Denies blood thinners or LOC.

## 2024-01-15 NOTE — Progress Notes (Addendum)
 Due to computer error we were unable to scan blood because there was no option to begin transfusion. Blood bank was notified, they recommended to chart on down-time and follow the protocol for blood transfusion. House supervisor was notified. Charge ICU RN came to room and recommended to follow policy/protocol and start the blood transfusion within the appropriate time frame and chart VS per protocol. ICU charge RN was able to pull up proper documentation to start blood transfusion on Epic. Blood was verified by 2 Rns.

## 2024-01-15 NOTE — ED Provider Notes (Signed)
 [place] [facility] Provider Note   CSN: [id number] Arrival date & time: 01/15/24  4098     History  Chief Complaint  Patient presents with   Urinary Frequency   Fall    Tommy Green is a 78 y.o. male presents today for falls and urinary frequency.  Per patient's wife over the past 2 weeks the patient has fallen 4 times.  Patient denies loss of consciousness, numbness, tingling, shortness of breath, fever, chills, chest pain, head, neck pain, or thinner use.  Patient does endorse some left-sided abdominal pain, low back pain, dysuria, and frequency.  Patient was seen on 3/26 at his primary care and had a urine culture.  Cefdinir was sent into their pharmacy yesterday, but per patient's wife they were not notified and therefore did not pick it up.  Patient has not been seen for his frequent falls.   Urinary Frequency  Fall       Home Medications Prior to Admission medications   Medication Sig Start Date End Date Taking? Authorizing Provider  acetaminophen (TYLENOL) 325 MG tablet Take 650 mg by mouth every 6 (six) hours as needed for mild pain or headache.    [provider]  amoxicillin (AMOXIL) 500 MG tablet Take 4 tablets (2,000 mg total) by mouth as directed. 1 HOUR PRIOR TO DENTAL APPOINTMENTS 05/18/22   Baldo Daub, MD  aspirin EC 81 MG tablet Take 1 tablet (81 mg total) by mouth daily. Swallow whole. 01/26/23   Georgie Chard D, NP  B Complex Vitamins (B COMPLEX PO) Take 1 tablet by mouth daily.    [provider]  cefdinir (OMNICEF) 300 MG capsule Take 1 capsule (300 mg total) by mouth 2 (two) times daily for 7 days. 01/14/24 01/21/24  [name], DO  Cholecalciferol (VITAMIN D3 SUPER STRENGTH) 50 MCG (2000 UT) CAPS Take 2,000 Units by mouth daily.    [provider]  escitalopram (LEXAPRO) 20 MG tablet Take 1 tablet (20 mg total) by mouth daily. 08/29/23   [name], DO   etanercept (ENBREL MINI) 50 MG/ML injection Inject 1 mL (50 mg total) into the skin once a week. 11/03/23   Gearldine Bienenstock, PA-C  ezetimibe (ZETIA) 10 MG tablet Take 1 tablet (10 mg total) by mouth daily. 11/08/23   [name], DO  fenofibrate micronized (LOFIBRA) 200 MG capsule TAKE 1 CAPSULE DAILY BEFOREBREAKFAST 06/17/23   [name], DO  finasteride (PROSCAR) 5 MG tablet Take 1 tablet (5 mg total) by mouth daily. 02/07/23   Joline Maxcy, MD  fluticasone (FLONASE) 50 MCG/ACT nasal spray Place 2 sprays into both nostrils daily. 06/16/22   [name], DO  HYDROcodone-acetaminophen (NORCO) 7.5-325 MG tablet Take 1 tablet by mouth every 6 (six) hours as needed for moderate pain (pain score 4-6). 12/15/23   [name], DO  HYDROcodone-acetaminophen (NORCO) 7.5-325 MG tablet Take 1 tablet by mouth every 6 (six) hours as needed for moderate pain (pain score 4-6). 11/15/23   [name], DO  HYDROcodone-acetaminophen (NORCO) 7.5-325 MG tablet Take 1 tablet by mouth every 6 (six) hours as needed for moderate pain (pain score 4-6). 01/14/24   [name], DO  insulin aspart (NOVOLOG FLEXPEN) 100 UNIT/ML FlexPen Inject 0-20 Units into the skin See admin instructions. Inject 0-10 units into the skin before meals, PER SLIDING SCALE: BGL 0-200 = give nothing, 201-250 = 2 units, 251-300 = 4 units,  301-350 = 6 units, 351-400 = 8 units, >400 = 10 units, push fluids, and repeat BGL check in 2 hours. If BGL remains >400 after 2 hours, CALL MD.    [provider]  insulin degludec (TRESIBA FLEXTOUCH) 100 UNIT/ML FlexTouch Pen Inject 30 Units into the skin daily.    [provider]  leflunomide (ARAVA) 10 MG tablet TAKE 1 TABLET DAILY 09/28/23   Pollyann Savoy, MD  lisinopril (ZESTRIL) 40 MG tablet Take 1 tablet (40 mg total) by mouth in the morning. 10/05/23   [name], DO  magnesium oxide (MAG-OX) 400 MG tablet  Take 400 mg by mouth daily with lunch.    [provider]  melatonin 5 MG TABS Take 5 mg by mouth at bedtime.    [provider]  Multiple Vitamins-Minerals (MULTIVITAMIN WITH MINERALS) tablet Take 1 tablet by mouth daily with breakfast.    [provider]  Omega 3 1000 MG CAPS Take 2,000 mg by mouth daily with lunch.    [provider]  omeprazole (PRILOSEC) 20 MG capsule Take 1 capsule (20 mg total) by mouth daily. 06/08/23   [name], DO  [facility] VERIO test strip 1 each by Other route as needed for other (Glucose). 06/14/20   [provider]  Potassium 99 MG TABS Take 99 mg by mouth every evening.    [provider]  vitamin B-12 (CYANOCOBALAMIN) 1000 MCG tablet Take 1,000 mcg by mouth daily.    [provider]  vitamin E 180 MG (400 UNITS) capsule Take 400 Units by mouth daily.    [provider]      Allergies    Canagliflozin, Aspirin, Atorvastatin, and Statins    Review of Systems   Review of Systems  Genitourinary:  Positive for dysuria and frequency.  Musculoskeletal:  Positive for back pain.    Physical Exam Updated Vital Signs BP (!) 159/69   Pulse 73   Temp 97.8 F (36.6 C) (Oral)   Resp 16   Ht 5\' 11"  (1.803 m)   Wt 97 kg   SpO2 100%   BMI 29.82 kg/m  Physical Exam Vitals and nursing note reviewed. Exam conducted with a chaperone present.  Constitutional:      General: He is not in acute distress.    Appearance: He is well-developed. He is not toxic-appearing or diaphoretic.  HENT:     Head: Normocephalic. No Battle's sign, abrasion or laceration.     Jaw: There is normal jaw occlusion.     Comments: Ecchymosis noted around patient's right eye    Right Ear: External ear normal.     Left Ear: External ear normal.     Nose: Nose normal.  Eyes:     General: Vision grossly intact.     Conjunctiva/sclera: Conjunctivae normal.  Cardiovascular:     Rate and Rhythm: Normal rate  and regular rhythm.     Pulses: Normal pulses.     Heart sounds: Normal heart sounds. No murmur heard. Pulmonary:     Effort: Pulmonary effort is normal. No respiratory distress.     Breath sounds: Normal breath sounds.  Abdominal:     General: Abdomen is protuberant.     Palpations: Abdomen is soft.     Tenderness: There is abdominal tenderness in the left upper quadrant and left lower quadrant.  Genitourinary:    Rectum: Normal. Guaiac result positive.  Musculoskeletal:        General: No swelling. Normal range  of motion.     Cervical back: Normal range of motion and neck supple. No tenderness.     Right lower leg: No edema.     Left lower leg: No edema.     Comments: Patient has mild tenderness to palpation of the L-spine.  No ecchymosis, step-offs, or deformities noted along spine.  Skin:    General: Skin is warm and dry.     Capillary Refill: Capillary refill takes less than 2 seconds.     Comments: No other ecchymosis or deformity noted on patient's body other than those noted previously.  Neurological:     Mental Status: He is alert.  Psychiatric:        Mood and Affect: Mood normal.     ED Results / Procedures / Treatments   Labs (all labs ordered are listed, but only abnormal results are displayed) Labs Reviewed  BASIC METABOLIC PANEL WITH GFR - Abnormal; Notable for the following components:      Result Value   Sodium 131 (*)    CO2 17 (*)    Glucose, Bld 122 (*)    BUN 49 (*)    Creatinine, Ser 2.49 (*)    Calcium 8.6 (*)    GFR, Estimated 26 (*)    All other components within normal limits  CBC WITH DIFFERENTIAL/PLATELET - Abnormal; Notable for the following components:   WBC 11.2 (*)    RBC 2.58 (*)    Hemoglobin 6.5 (*)    HCT 20.0 (*)    MCV 77.5 (*)    MCH 25.2 (*)    RDW 17.0 (*)    Platelets 433 (*)    All other components within normal limits  URINALYSIS, W/ REFLEX TO CULTURE (INFECTION SUSPECTED) - Abnormal; Notable for the following  components:   APPearance CLOUDY (*)    Hgb urine dipstick TRACE (*)    Protein, ur 30 (*)    Leukocytes,Ua LARGE (*)    Bacteria, UA MANY (*)    All other components within normal limits  OCCULT BLOOD X 1 CARD TO LAB, STOOL - Abnormal; Notable for the following components:   Fecal Occult Bld POSITIVE (*)    All other components within normal limits  CBG MONITORING, ED - Abnormal; Notable for the following components:   Glucose-Capillary 118 (*)    All other components within normal limits  CULTURE, BLOOD (ROUTINE X 2)  CULTURE, BLOOD (ROUTINE X 2)  URINE CULTURE  LACTIC ACID, PLASMA  LACTIC ACID, PLASMA  HEMOGLOBIN AND HEMATOCRIT, BLOOD    EKG None  Radiology CT CHEST ABDOMEN PELVIS WO CONTRAST Result Date: 01/15/2024 CLINICAL DATA:  Abdominal trauma, blunt. Multiple falls in last 2 weeks. Worsening urinary frequency. Dysuria. * Tracking Code: BO * EXAM: CT CHEST, ABDOMEN AND PELVIS WITHOUT CONTRAST TECHNIQUE: Multidetector CT imaging of the chest, abdomen and pelvis was performed following the standard protocol without IV contrast. RADIATION DOSE REDUCTION: This exam was performed according to the departmental dose-optimization program which includes automated exposure control, adjustment of the mA and/or kV according to patient size and/or use of iterative reconstruction technique. COMPARISON:  CT scan chest, abdomen and pelvis from 12/18/2021 and CT scan abdomen and pelvis from 08/21/2023 FINDINGS: CT CHEST FINDINGS Cardiovascular: Normal cardiac size. No pericardial effusion. No aortic aneurysm. There are coronary artery calcifications, in keeping with coronary artery disease. Prosthetic aortic valve noted. There is dense mitral annulus calcification. Mediastinum/Nodes: Visualized thyroid gland appears grossly unremarkable. No solid / cystic mediastinal masses. The  esophagus is nondistended precluding optimal assessment. No mediastinal or axillary lymphadenopathy by size criteria.  Evaluation of bilateral hila is limited due to lack on intravenous contrast: however, no large hilar lymphadenopathy identified. Lungs/Pleura: The central tracheo-bronchial tree is patent. There are patchy areas of linear, plate-like atelectasis and/or scarring throughout bilateral lungs. No mass or consolidation. No pleural effusion or pneumothorax. There is a subpleural solid noncalcified 6 x 7 mm nodule in the left lung lower lobe (series 312, image 58), which is grossly unchanged since the prior study from 08/21/2023 but new since the prior study from 12/18/2021. There is a stable 2 mm calcified granuloma in the left lung lower lobe. Musculoskeletal: The visualized soft tissues of the chest wall are grossly unremarkable. No suspicious osseous lesions. There are mild multilevel degenerative changes in the visualized spine. CT ABDOMEN PELVIS FINDINGS Hepatobiliary: The liver is normal in size. Non-cirrhotic configuration. There is a new ill-defined heterogeneous hypoattenuating approximately 4.2 x 4.8 cm mass in the right hepatic lobe, segment 5/6, which is new since the prior study and highly concerning for neoplastic process. There is an additional smaller approximately 1 cm sized hypoattenuating structure in the right hepatic lobe, segment 8, which is also concerning for neoplastic process. Further evaluation with multiphasic contrast-enhanced MRI or CT scan abdomen as per liver mass protocol is recommended. No intrahepatic or extrahepatic bile duct dilation. Gallbladder is surgically absent. Pancreas: Unremarkable. No pancreatic ductal dilatation or surrounding inflammatory changes. Spleen: Within normal limits. No focal lesion. Adrenals/Urinary Tract: Adrenal glands are unremarkable. No suspicious renal mass within the limitations of this unenhanced exam. There is a nearly completely exophytic 2.1 x 2.3 cm structure arising from the left kidney upper pole, with internal CT attenuation of 29 Hounsfield units.  This is incompletely characterized on this unenhanced exam but is present since the prior study and favored to represent a proteinaceous/hemorrhagic cyst. This can also be better characterized on the MRI exam. No nephroureterolithiasis or obstructive uropathy. Unremarkable urinary bladder. Stomach/Bowel: There is a small sliding hiatal hernia. No disproportionate dilation of the small or large bowel loops. No evidence of abnormal bowel wall thickening or inflammatory changes. The appendix is unremarkable. There are multiple diverticula mainly in the left hemi colon, without imaging signs of diverticulitis. Vascular/Lymphatic: No ascites or pneumoperitoneum. There is an irregular approximately 1.4 x 1.7 cm soft tissue attenuation nodule in the right upper quadrant abutting the right inferior liver surface, favored to represent metastatic deposit. No abdominal or pelvic lymphadenopathy, by size criteria. No aneurysmal dilation of the major abdominal arteries. There are minimal peripheral atherosclerotic vascular calcifications of the aorta and its major branches. Reproductive: Normal size prostate. Symmetric seminal vesicles. Other: There are bilateral small fat containing inguinal hernias. The soft tissues and abdominal wall are otherwise unremarkable. Musculoskeletal: There is a subcentimeter sized lytic lesion in the right iliac bone (610, image 95), which is incompletely characterized on the current exam but present since the prior study from March 2023 and favored benign. There are mild multilevel degenerative changes in the visualized spine. Lower thoracic, lumbar and upper sacral spinal fixation hardware noted including bilateral SI joint arthrodesis. IMPRESSION: 1. No acute traumatic injury to the chest, abdomen or pelvis. 2. There is a new ill-defined heterogeneous hypoattenuating approximately 4.2 x 4.8 cm mass in the right hepatic lobe, segment 5/6, which is new since the prior study and highly concerning  for neoplastic process. There is an additional smaller approximately 1 cm sized hypoattenuating structure in the right hepatic  lobe, segment 8, which is also concerning for neoplastic process. Further evaluation with multiphasic contrast-enhanced MRI or CT scan abdomen as per liver mass protocol is recommended. 3. There is an irregular approximately 1.4 x 1.7 cm soft tissue attenuation nodule in the right upper quadrant abutting the right inferior liver surface, favored to represent metastatic deposit. 4. There is a subpleural solid noncalcified 6 x 7 mm nodule in the left lung lower lobe, which is grossly unchanged since the prior study but new since the prior study from 12/18/2021. This is indeterminate. 5. Multiple other nonacute observations, as described above. Aortic Atherosclerosis (ICD10-I70.0). Electronically Signed   By: Jules Schick M.D.   On: 01/15/2024 11:56   CT L-SPINE NO CHARGE Result Date: 01/15/2024 CLINICAL DATA:  Worsening urinary frequency since Wednesday. Dysuria. History of 4 falls in the last 2 weeks. EXAM: CT Lumbar Spine without contrast TECHNIQUE: Technique: Multiplanar CT images of the lumbar spine were reconstructed from contemporary CT of the Abdomen and Pelvis. RADIATION DOSE REDUCTION: This exam was performed according to the departmental dose-optimization program which includes automated exposure control, adjustment of the mA and/or kV according to patient size and/or use of iterative reconstruction technique. CONTRAST:  None COMPARISON:  Lumbar radiography 03/30/2020 FINDINGS: Segmentation: 5 lumbar type vertebrae Alignment: No acute malalignment.  Dextroscoliosis. Vertebrae: Thoracic (at least T10) to pelvic fusion solid arthrodesis is seen at L3-L5. There is vacuum phenomenon in the L5-S1 disc space with lucency around the S1 and pelvic screws. Intervertebral gas also seen at upper lumbar levels including L2-3. No acute fracture or aggressive bone lesion. Hip osteoarthritis  with joint space narrowing on the left more than right. Paraspinal and other soft tissues: Extensive postoperative scarring and fatty muscular atrophy at levels of broad laminectomy in the lumbar spine. Disc levels: No high-grade bony spinal stenosis is seen after laminectomy. Degenerative foraminal impingement on the left at L2-3 which appears advanced. Other foraminal narrowings are mild-to-moderate. IMPRESSION: 1. No acute finding. 2. 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 3. Advanced degeneration with multilevel laminectomy. Electronically Signed   By: Tiburcio Pea M.D.   On: 01/15/2024 11:44   CT Head Wo Contrast Result Date: 01/15/2024 CLINICAL DATA:  Head trauma, minor (Age >= 65y); Neck trauma (Age >= 65y); Facial trauma, blunt EXAM: CT HEAD WITHOUT CONTRAST CT MAXILLOFACIAL WITHOUT CONTRAST CT CERVICAL SPINE WITHOUT CONTRAST TECHNIQUE: Multidetector CT imaging of the head, cervical spine, and maxillofacial structures were performed using the standard protocol without intravenous contrast. Multiplanar CT image reconstructions of the cervical spine and maxillofacial structures were also generated. RADIATION DOSE REDUCTION: This exam was performed according to the departmental dose-optimization program which includes automated exposure control, adjustment of the mA and/or kV according to patient size and/or use of iterative reconstruction technique. COMPARISON:  12/09/2021 FINDINGS: CT HEAD FINDINGS Brain: No evidence of acute infarction, hemorrhage, hydrocephalus, extra-axial collection or mass lesion/mass effect. Patchy low-density changes within the periventricular and subcortical white matter most compatible with chronic microvascular ischemic change. Moderate diffuse cerebral volume loss. Vascular: No hyperdense vessel or unexpected calcification. Skull: Prior left parietal craniotomy.  No acute calvarial fracture. Other: Negative for scalp hematoma. CT  MAXILLOFACIAL FINDINGS Osseous: No acute maxillofacial bone fracture. Bony orbital walls are intact. Mandible intact. Temporomandibular joints are aligned without dislocation. Orbits: Negative. No traumatic or inflammatory finding. Sinuses: Clear. Soft tissues: Negative. CT CERVICAL SPINE FINDINGS Alignment: Facet joints are aligned without dislocation or traumatic listhesis. Dens and lateral masses are aligned. Skull  base and vertebrae: No acute fracture. No primary bone lesion or focal pathologic process. Soft tissues and spinal canal: No prevertebral fluid or swelling. No visible canal hematoma. Disc levels:  Moderate multilevel cervical spondylosis. Upper chest: Included lung apices are clear. Other: Bilateral carotid atherosclerosis. IMPRESSION: 1. No acute intracranial abnormality. 2. No acute maxillofacial bone fracture. 3. No acute fracture or subluxation of the cervical spine. Electronically Signed   By: Duanne Guess D.O.   On: 01/15/2024 11:42   CT Cervical Spine Wo Contrast Result Date: 01/15/2024 CLINICAL DATA:  Head trauma, minor (Age >= 65y); Neck trauma (Age >= 65y); Facial trauma, blunt EXAM: CT HEAD WITHOUT CONTRAST CT MAXILLOFACIAL WITHOUT CONTRAST CT CERVICAL SPINE WITHOUT CONTRAST TECHNIQUE: Multidetector CT imaging of the head, cervical spine, and maxillofacial structures were performed using the standard protocol without intravenous contrast. Multiplanar CT image reconstructions of the cervical spine and maxillofacial structures were also generated. RADIATION DOSE REDUCTION: This exam was performed according to the departmental dose-optimization program which includes automated exposure control, adjustment of the mA and/or kV according to patient size and/or use of iterative reconstruction technique. COMPARISON:  12/09/2021 FINDINGS: CT HEAD FINDINGS Brain: No evidence of acute infarction, hemorrhage, hydrocephalus, extra-axial collection or mass lesion/mass effect. Patchy low-density  changes within the periventricular and subcortical white matter most compatible with chronic microvascular ischemic change. Moderate diffuse cerebral volume loss. Vascular: No hyperdense vessel or unexpected calcification. Skull: Prior left parietal craniotomy.  No acute calvarial fracture. Other: Negative for scalp hematoma. CT MAXILLOFACIAL FINDINGS Osseous: No acute maxillofacial bone fracture. Bony orbital walls are intact. Mandible intact. Temporomandibular joints are aligned without dislocation. Orbits: Negative. No traumatic or inflammatory finding. Sinuses: Clear. Soft tissues: Negative. CT CERVICAL SPINE FINDINGS Alignment: Facet joints are aligned without dislocation or traumatic listhesis. Dens and lateral masses are aligned. Skull base and vertebrae: No acute fracture. No primary bone lesion or focal pathologic process. Soft tissues and spinal canal: No prevertebral fluid or swelling. No visible canal hematoma. Disc levels:  Moderate multilevel cervical spondylosis. Upper chest: Included lung apices are clear. Other: Bilateral carotid atherosclerosis. IMPRESSION: 1. No acute intracranial abnormality. 2. No acute maxillofacial bone fracture. 3. No acute fracture or subluxation of the cervical spine. Electronically Signed   By: Duanne Guess D.O.   On: 01/15/2024 11:42   CT Maxillofacial Wo Contrast Result Date: 01/15/2024 CLINICAL DATA:  Head trauma, minor (Age >= 65y); Neck trauma (Age >= 65y); Facial trauma, blunt EXAM: CT HEAD WITHOUT CONTRAST CT MAXILLOFACIAL WITHOUT CONTRAST CT CERVICAL SPINE WITHOUT CONTRAST TECHNIQUE: Multidetector CT imaging of the head, cervical spine, and maxillofacial structures were performed using the standard protocol without intravenous contrast. Multiplanar CT image reconstructions of the cervical spine and maxillofacial structures were also generated. RADIATION DOSE REDUCTION: This exam was performed according to the departmental dose-optimization program which  includes automated exposure control, adjustment of the mA and/or kV according to patient size and/or use of iterative reconstruction technique. COMPARISON:  12/09/2021 FINDINGS: CT HEAD FINDINGS Brain: No evidence of acute infarction, hemorrhage, hydrocephalus, extra-axial collection or mass lesion/mass effect. Patchy low-density changes within the periventricular and subcortical white matter most compatible with chronic microvascular ischemic change. Moderate diffuse cerebral volume loss. Vascular: No hyperdense vessel or unexpected calcification. Skull: Prior left parietal craniotomy.  No acute calvarial fracture. Other: Negative for scalp hematoma. CT MAXILLOFACIAL FINDINGS Osseous: No acute maxillofacial bone fracture. Bony orbital walls are intact. Mandible intact. Temporomandibular joints are aligned without dislocation. Orbits: Negative. No traumatic or inflammatory finding. Sinuses:  Clear. Soft tissues: Negative. CT CERVICAL SPINE FINDINGS Alignment: Facet joints are aligned without dislocation or traumatic listhesis. Dens and lateral masses are aligned. Skull base and vertebrae: No acute fracture. No primary bone lesion or focal pathologic process. Soft tissues and spinal canal: No prevertebral fluid or swelling. No visible canal hematoma. Disc levels:  Moderate multilevel cervical spondylosis. Upper chest: Included lung apices are clear. Other: Bilateral carotid atherosclerosis. IMPRESSION: 1. No acute intracranial abnormality. 2. No acute maxillofacial bone fracture. 3. No acute fracture or subluxation of the cervical spine. Electronically Signed   By: Duanne Guess D.O.   On: 01/15/2024 11:42    Procedures Procedures    Medications Ordered in ED Medications  cefTRIAXone (ROCEPHIN) 1 g in sodium chloride 0.9 % 100 mL IVPB (0 g Intravenous Stopped 01/15/24 1037)  0.9 %  sodium chloride infusion ( Intravenous New Bag/Given 01/15/24 1339)  pantoprazole (PROTONIX) injection 40 mg (40 mg  Intravenous Given 01/15/24 1340)    ED Course/ Medical Decision Making/ A&P                                 Medical Decision Making  This patient presents to the ED with chief complaint(s) of falls and urinary frequency with pertinent past medical history of stroke, diabetes, hypertension, anemia which further complicates the presenting complaint. The complaint involves an extensive differential diagnosis and also carries with it a high risk of complications and morbidity.    The differential diagnosis includes anemia, UTI, sepsis, C-spine injury, brain bleed, skull fracture, vertebral fracture  Additional history obtained: Additional history obtained from spouse Records reviewed Primary Care Documents  ED Course and Reassessment: Bladder scan: 0 Patient given 1 g Rocephin IV  Independent labs interpretation:  The following labs were independently interpreted:  CBG: 118 CBC: Leukocytosis at 11.2, anemia at 6.5, previously 8.4 on 08/29/23 BMP: Elevated creatinine at 2.49, mild hyponatremia at 131, decreased CO2 at 17, elevated bun at 49 UA: Trace hemoglobin, large leuks, 30 protein, many bacteria, greater than 50 WBCs Blood cultures: Pending Lactic acid: 1.2 EKG: Sinus, borderline prolonged PR  Independent visualization of imaging: - I independently visualized the following imaging with scope of interpretation limited to determining acute life threatening conditions related to emergency care:  CT head, maxillofacial, C-spine Noncon: No acute intracranial abnormality, no acute maxillofacial bone fracture, no acute fracture or subluxation of the C-spine CT chest abdomen pelvis w/o contrast: No acute traumatic injury to the chest, abdomen, or pelvis.  Multiple masses noted which will require further evaluation with multiphasic contrast-enhanced MRI or CT. L-spine: No acute findings  Consultation: - Consulted or discussed management/test interpretation w/ external professional:  Hospitalist, Dr. [place] who is agreeable to admission and requested a repeat H&H. Gastroenterology, Dr. Loreta Ave who is agreeable to consult on the patient while admitted  Consideration for admission or further workup: Admit        Final Clinical Impression(s) / ED Diagnoses Final diagnoses:  Urinary tract infection with hematuria, site unspecified  Anemia, unspecified type  AKI (acute kidney injury) (HCC)  Weakness  Gastrointestinal hemorrhage, unspecified gastrointestinal hemorrhage type    Rx / DC Orders ED Discharge Orders     None         Dolphus Jenny, PA-C 01/15/24 1357    Estelle June A, DO 01/16/24 1621

## 2024-01-16 ENCOUNTER — Telehealth: Payer: Self-pay

## 2024-01-16 ENCOUNTER — Inpatient Hospital Stay (HOSPITAL_COMMUNITY)

## 2024-01-16 DIAGNOSIS — D509 Iron deficiency anemia, unspecified: Secondary | ICD-10-CM | POA: Diagnosis not present

## 2024-01-16 DIAGNOSIS — R932 Abnormal findings on diagnostic imaging of liver and biliary tract: Secondary | ICD-10-CM

## 2024-01-16 DIAGNOSIS — N179 Acute kidney failure, unspecified: Secondary | ICD-10-CM

## 2024-01-16 DIAGNOSIS — N1832 Chronic kidney disease, stage 3b: Secondary | ICD-10-CM

## 2024-01-16 DIAGNOSIS — M0520 Rheumatoid vasculitis with rheumatoid arthritis of unspecified site: Secondary | ICD-10-CM | POA: Insufficient documentation

## 2024-01-16 DIAGNOSIS — R160 Hepatomegaly, not elsewhere classified: Secondary | ICD-10-CM | POA: Diagnosis not present

## 2024-01-16 DIAGNOSIS — K922 Gastrointestinal hemorrhage, unspecified: Secondary | ICD-10-CM | POA: Diagnosis not present

## 2024-01-16 DIAGNOSIS — R195 Other fecal abnormalities: Secondary | ICD-10-CM | POA: Diagnosis not present

## 2024-01-16 DIAGNOSIS — D649 Anemia, unspecified: Secondary | ICD-10-CM

## 2024-01-16 DIAGNOSIS — K750 Abscess of liver: Secondary | ICD-10-CM | POA: Insufficient documentation

## 2024-01-16 LAB — CBC
HCT: 25.8 % — ABNORMAL LOW (ref 39.0–52.0)
Hemoglobin: 8.5 g/dL — ABNORMAL LOW (ref 13.0–17.0)
MCH: 27 pg (ref 26.0–34.0)
MCHC: 32.9 g/dL (ref 30.0–36.0)
MCV: 81.9 fL (ref 80.0–100.0)
Platelets: 418 10*3/uL — ABNORMAL HIGH (ref 150–400)
RBC: 3.15 MIL/uL — ABNORMAL LOW (ref 4.22–5.81)
RDW: 17.5 % — ABNORMAL HIGH (ref 11.5–15.5)
WBC: 12.1 10*3/uL — ABNORMAL HIGH (ref 4.0–10.5)
nRBC: 0 % (ref 0.0–0.2)

## 2024-01-16 LAB — BASIC METABOLIC PANEL WITH GFR
Anion gap: 12 (ref 5–15)
BUN: 43 mg/dL — ABNORMAL HIGH (ref 8–23)
CO2: 14 mmol/L — ABNORMAL LOW (ref 22–32)
Calcium: 8.8 mg/dL — ABNORMAL LOW (ref 8.9–10.3)
Chloride: 105 mmol/L (ref 98–111)
Creatinine, Ser: 2.47 mg/dL — ABNORMAL HIGH (ref 0.61–1.24)
GFR, Estimated: 26 mL/min — ABNORMAL LOW (ref >60)
Glucose, Bld: 122 mg/dL — ABNORMAL HIGH (ref 70–99)
Potassium: 3.8 mmol/L (ref 3.5–5.1)
Sodium: 131 mmol/L — ABNORMAL LOW (ref 135–145)

## 2024-01-16 LAB — HEPATIC FUNCTION PANEL
ALT: 22 U/L (ref 0–44)
AST: 26 U/L (ref 15–41)
Albumin: 3 g/dL — ABNORMAL LOW (ref 3.5–5.0)
Alkaline Phosphatase: 64 U/L (ref 38–126)
Bilirubin, Direct: 0.1 mg/dL (ref 0.0–0.2)
Indirect Bilirubin: 0.6 mg/dL (ref 0.3–0.9)
Total Bilirubin: 0.7 mg/dL (ref 0.0–1.2)
Total Protein: 6.9 g/dL (ref 6.5–8.1)

## 2024-01-16 LAB — BPAM RBC
Blood Product Expiration Date: 202504272359
Blood Product Expiration Date: 202504272359
ISSUE DATE / TIME: 202503301833
ISSUE DATE / TIME: 202503302240
Unit Type and Rh: 5100
Unit Type and Rh: 5100

## 2024-01-16 LAB — TYPE AND SCREEN
ABO/RH(D): O POS
Antibody Screen: NEGATIVE
Unit division: 0
Unit division: 0

## 2024-01-16 LAB — HEMOGLOBIN AND HEMATOCRIT, BLOOD
HCT: 27.9 % — ABNORMAL LOW (ref 39.0–52.0)
Hemoglobin: 8.9 g/dL — ABNORMAL LOW (ref 13.0–17.0)

## 2024-01-16 LAB — GLUCOSE, CAPILLARY
Glucose-Capillary: 135 mg/dL — ABNORMAL HIGH (ref 70–99)
Glucose-Capillary: 159 mg/dL — ABNORMAL HIGH (ref 70–99)
Glucose-Capillary: 141 mg/dL — ABNORMAL HIGH (ref 70–99)
Glucose-Capillary: 186 mg/dL — ABNORMAL HIGH (ref 70–99)

## 2024-01-16 LAB — PROTIME-INR
INR: 1.3 — ABNORMAL HIGH (ref 0.8–1.2)
Prothrombin Time: 16 s — ABNORMAL HIGH (ref 11.4–15.2)

## 2024-01-16 MED ORDER — HYDROXYZINE HCL 10 MG PO TABS
10.0000 mg | ORAL_TABLET | Freq: Once | ORAL | Status: AC | PRN
  Administered 2024-01-16: 10 mg via ORAL
  Filled 2024-01-16: qty 1

## 2024-01-16 MED ORDER — GADOBUTROL 1 MMOL/ML IV SOLN
10.0000 mL | Freq: Once | INTRAVENOUS | Status: AC | PRN
  Administered 2024-01-16: 10 mL via INTRAVENOUS

## 2024-01-16 MED ORDER — LACTATED RINGERS IV SOLN: INTRAVENOUS | Status: DC

## 2024-01-16 NOTE — Telephone Encounter (Unsigned)
 Copied from CRM [id number]. Topic: General - Other >> Jan 16, 2024  9:32 AM Rodman Pickle T wrote: Reason for CRM: steve from [facility] is requesting pt orders for patient one week one two week three one week two and ot evaluation  as well

## 2024-01-16 NOTE — Assessment & Plan Note (Signed)
-   On Zetia, fenofibrate at home

## 2024-01-16 NOTE — Assessment & Plan Note (Addendum)
-   Continue SSI and CBG monitoring for now -Last A1c 5.8% on 01/15/2024 -basal insulin resumed at d/c

## 2024-01-16 NOTE — Assessment & Plan Note (Signed)
-   Prior history of severe AS

## 2024-01-16 NOTE — Telephone Encounter (Signed)
 OK from me. Pt is currently hospitalized.

## 2024-01-16 NOTE — Assessment & Plan Note (Addendum)
-   patient has history of CKD3b. Baseline creat ~ 1.8 - 2.2, eGFR~ 32 - patient presents with increase in creat >0.3 mg/dL above baseline or creat increase >1.5x baseline presumed to have occurred within past 7 days PTA - creat 2.49 on admission - suspect volume depletion but may also progressive CKD given borderline GFR recently and at risk for progression - creat now stable

## 2024-01-16 NOTE — Plan of Care (Signed)
  Problem: Safety: Goal: Ability to remain free from injury will improve 01/16/2024 2356 by Josph Macho, RN Outcome: Progressing 01/16/2024 1934 by Josph Macho, RN Outcome: Progressing   Problem: Pain Managment: Goal: General experience of comfort will improve and/or be controlled 01/16/2024 2356 by Josph Macho, RN Outcome: Progressing 01/16/2024 1934 by Josph Macho, RN Outcome: Progressing

## 2024-01-16 NOTE — Assessment & Plan Note (Addendum)
-   Patient and wife aware of CT findings notably right hepatic lobe mass measuring 4.2 cm x 4.8 cm and left lower lobe lung nodule measuring 6 x 7 mm; CT also mentions suspected nodule measuring 1.4 x 1.7 cm in the right upper quadrant abutting inferior liver surface - Liver MRI on 3/31 noting this may be hepatic abscesses as they are rim enhancing lesions - biopsy obtained 4/2 of liver and findings consistent with abscess; culture also grew e.coli - ID consulted; patient to be on cipro/flagyl at discharge for 4 weeks (1 refill) with outpt followup planned with ID on 4/22

## 2024-01-16 NOTE — Hospital Course (Signed)
 Mr. Gottwald is a 78 yo male with PMH malignant melanoma, DMII, RA, GERD, hx CVA, HLD who presented with weakness, recurrent falls, and some persistent dark stools.  He had fallen about 1 week prior in the bathroom and struck his right face resulting in a bruised eye as well.  On workup he was noted to have Hgb 6.5 g/dL and underwent 2 units PRBC on admission. Multiple imaging studies were obtained due to his falls and no traumatic injuries were noted. He was however noted to have a right hepatic lobe lesion measuring 4.2 x 4.8 cm along with a left lower lobe nodule measuring 6 x 7 mm. GI was also consulted due to concern for GI bleed.  He has a known history of esophageal varices,  Schatzki ring, and gastric polyps on last EGD in Nov 2021.   He was admitted for ongoing GI evaluation and further workup regarding masses noted on scans.

## 2024-01-16 NOTE — Assessment & Plan Note (Signed)
 -  Continue finasteride

## 2024-01-16 NOTE — Progress Notes (Addendum)
 [facility] Progress Note  CC: Anemia, positive FOBT and liver mass  Subjective: He denies having any nausea or vomiting.  No abdominal pain.  He just passed a normal formed brown stool as reported by the nursing staff.  He denies passing any bloody or black stools prior to admission.  No chest pain or shortness of breath.  No family at the bedside.   Objective:  Vital signs in last 24 hours: Temp:  [97.5 F (36.4 C)-98.7 F (37.1 C)] 98.3 F (36.8 C) (03/31 0434) Pulse Rate:  [69-97] 91 (03/31 0434) Resp:  [10-20] 17 (03/31 0434) BP: (135-184)/(62-130) 171/75 (03/31 0434) SpO2:  [93 %-100 %] 97 % (03/31 0434) Weight:  [97 kg] 97 kg (03/30 0924) Last BM Date : 01/15/24 General: Fatigued appearing 78 year old male in no acute distress. Head: Scattered facial ecchymosis including right orbital area. Eyes: No scleral icterus.  Pupils equal and reactive. Heart: Regular rate and rhythm, no murmurs. Pulm: Breath sounds clear throughout. Abdomen: Soft, nondistended.  Mild tenderness to the epigastric, RUQ and RLQ without rebound or guarding.  No hepatosplenomegaly.  No palpable mass.  Positive bowel sounds to all 4 quadrants.  No bruit. Extremities: No edema. Neurologic:  Alert and  oriented x 4.  Speech is clear.  Moves all extremities equally. Psych:  Alert and cooperative. Normal mood and affect.  Intake/Output from previous day: 03/30 0701 - 03/31 0700 In: 1956.2 [P.O.:930; I.V.:236.4; Blood:692; IV Piggyback:97.8] Out: 1675 [Urine:1675] Intake/Output this shift: No intake/output data recorded.  Lab Results: Recent Labs    01/15/24 0931 01/15/24 1359 01/16/24 0425  WBC 11.2*  --  12.1*  HGB 6.5* 6.1* 8.5*  HCT 20.0* 18.1* 25.8*  PLT 433*  --  418*   BMET Recent Labs    01/15/24 0931 01/16/24 0425  NA 131* 131*  K 4.7 3.8  CL 102 105  CO2 17* 14*  GLUCOSE 122* 122*  BUN 49* 43*  CREATININE 2.49* 2.47*  CALCIUM 8.6* 8.8*   LFT No results  for input(s): "PROT", "ALBUMIN", "AST", "ALT", "ALKPHOS", "BILITOT", "BILIDIR", "IBILI" in the last 72 hours. PT/INR No results for input(s): "LABPROT", "INR" in the last 72 hours. Hepatitis Panel No results for input(s): "HEPBSAG", "HCVAB", "HEPAIGM", "HEPBIGM" in the last 72 hours.  CT CHEST ABDOMEN PELVIS WO CONTRAST Result Date: 01/15/2024 CLINICAL DATA:  Abdominal trauma, blunt. Multiple falls in last 2 weeks. Worsening urinary frequency. Dysuria. * Tracking Code: BO * EXAM: CT CHEST, ABDOMEN AND PELVIS WITHOUT CONTRAST TECHNIQUE: Multidetector CT imaging of the chest, abdomen and pelvis was performed following the standard protocol without IV contrast. RADIATION DOSE REDUCTION: This exam was performed according to the departmental dose-optimization program which includes automated exposure control, adjustment of the mA and/or kV according to patient size and/or use of iterative reconstruction technique. COMPARISON:  CT scan chest, abdomen and pelvis from 12/18/2021 and CT scan abdomen and pelvis from 08/21/2023 FINDINGS: CT CHEST FINDINGS Cardiovascular: Normal cardiac size. No pericardial effusion. No aortic aneurysm. There are coronary artery calcifications, in keeping with coronary artery disease. Prosthetic aortic valve noted. There is dense mitral annulus calcification. Mediastinum/Nodes: Visualized thyroid gland appears grossly unremarkable. No solid / cystic mediastinal masses. The esophagus is nondistended precluding optimal assessment. No mediastinal or axillary lymphadenopathy by size criteria. Evaluation of bilateral hila is limited due to lack on intravenous contrast: however, no large hilar lymphadenopathy identified. Lungs/Pleura: The central tracheo-bronchial tree is patent. There are patchy areas of linear, plate-like atelectasis and/or scarring  throughout bilateral lungs. No mass or consolidation. No pleural effusion or pneumothorax. There is a subpleural solid noncalcified 6 x 7 mm  nodule in the left lung lower lobe (series 312, image 58), which is grossly unchanged since the prior study from 08/21/2023 but new since the prior study from 12/18/2021. There is a stable 2 mm calcified granuloma in the left lung lower lobe. Musculoskeletal: The visualized soft tissues of the chest wall are grossly unremarkable. No suspicious osseous lesions. There are mild multilevel degenerative changes in the visualized spine. CT ABDOMEN PELVIS FINDINGS Hepatobiliary: The liver is normal in size. Non-cirrhotic configuration. There is a new ill-defined heterogeneous hypoattenuating approximately 4.2 x 4.8 cm mass in the right hepatic lobe, segment 5/6, which is new since the prior study and highly concerning for neoplastic process. There is an additional smaller approximately 1 cm sized hypoattenuating structure in the right hepatic lobe, segment 8, which is also concerning for neoplastic process. Further evaluation with multiphasic contrast-enhanced MRI or CT scan abdomen as per liver mass protocol is recommended. No intrahepatic or extrahepatic bile duct dilation. Gallbladder is surgically absent. Pancreas: Unremarkable. No pancreatic ductal dilatation or surrounding inflammatory changes. Spleen: Within normal limits. No focal lesion. Adrenals/Urinary Tract: Adrenal glands are unremarkable. No suspicious renal mass within the limitations of this unenhanced exam. There is a nearly completely exophytic 2.1 x 2.3 cm structure arising from the left kidney upper pole, with internal CT attenuation of 29 Hounsfield units. This is incompletely characterized on this unenhanced exam but is present since the prior study and favored to represent a proteinaceous/hemorrhagic cyst. This can also be better characterized on the MRI exam. No nephroureterolithiasis or obstructive uropathy. Unremarkable urinary bladder. Stomach/Bowel: There is a small sliding hiatal hernia. No disproportionate dilation of the small or large bowel  loops. No evidence of abnormal bowel wall thickening or inflammatory changes. The appendix is unremarkable. There are multiple diverticula mainly in the left hemi colon, without imaging signs of diverticulitis. Vascular/Lymphatic: No ascites or pneumoperitoneum. There is an irregular approximately 1.4 x 1.7 cm soft tissue attenuation nodule in the right upper quadrant abutting the right inferior liver surface, favored to represent metastatic deposit. No abdominal or pelvic lymphadenopathy, by size criteria. No aneurysmal dilation of the major abdominal arteries. There are minimal peripheral atherosclerotic vascular calcifications of the aorta and its major branches. Reproductive: Normal size prostate. Symmetric seminal vesicles. Other: There are bilateral small fat containing inguinal hernias. The soft tissues and abdominal wall are otherwise unremarkable. Musculoskeletal: There is a subcentimeter sized lytic lesion in the right iliac bone (610, image 95), which is incompletely characterized on the current exam but present since the prior study from March 2023 and favored benign. There are mild multilevel degenerative changes in the visualized spine. Lower thoracic, lumbar and upper sacral spinal fixation hardware noted including bilateral SI joint arthrodesis. IMPRESSION: 1. No acute traumatic injury to the chest, abdomen or pelvis. 2. There is a new ill-defined heterogeneous hypoattenuating approximately 4.2 x 4.8 cm mass in the right hepatic lobe, segment 5/6, which is new since the prior study and highly concerning for neoplastic process. There is an additional smaller approximately 1 cm sized hypoattenuating structure in the right hepatic lobe, segment 8, which is also concerning for neoplastic process. Further evaluation with multiphasic contrast-enhanced MRI or CT scan abdomen as per liver mass protocol is recommended. 3. There is an irregular approximately 1.4 x 1.7 cm soft tissue attenuation nodule in the  right upper quadrant abutting the right  inferior liver surface, favored to represent metastatic deposit. 4. There is a subpleural solid noncalcified 6 x 7 mm nodule in the left lung lower lobe, which is grossly unchanged since the prior study but new since the prior study from 12/18/2021. This is indeterminate. 5. Multiple other nonacute observations, as described above. Aortic Atherosclerosis (ICD10-I70.0). Electronically Signed   By: Jules Schick M.D.   On: 01/15/2024 11:56   CT L-SPINE NO CHARGE Result Date: 01/15/2024 CLINICAL DATA:  Worsening urinary frequency since Wednesday. Dysuria. History of 4 falls in the last 2 weeks. EXAM: CT Lumbar Spine without contrast TECHNIQUE: Technique: Multiplanar CT images of the lumbar spine were reconstructed from contemporary CT of the Abdomen and Pelvis. RADIATION DOSE REDUCTION: This exam was performed according to the departmental dose-optimization program which includes automated exposure control, adjustment of the mA and/or kV according to patient size and/or use of iterative reconstruction technique. CONTRAST:  None COMPARISON:  Lumbar radiography 03/30/2020 FINDINGS: Segmentation: 5 lumbar type vertebrae Alignment: No acute malalignment.  Dextroscoliosis. Vertebrae: Thoracic (at least T10) to pelvic fusion solid arthrodesis is seen at L3-L5. There is vacuum phenomenon in the L5-S1 disc space with lucency around the S1 and pelvic screws. Intervertebral gas also seen at upper lumbar levels including L2-3. No acute fracture or aggressive bone lesion. Hip osteoarthritis with joint space narrowing on the left more than right. Paraspinal and other soft tissues: Extensive postoperative scarring and fatty muscular atrophy at levels of broad laminectomy in the lumbar spine. Disc levels: No high-grade bony spinal stenosis is seen after laminectomy. Degenerative foraminal impingement on the left at L2-3 which appears advanced. Other foraminal narrowings are  mild-to-moderate. IMPRESSION: 1. No acute finding. 2. 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 3. Advanced degeneration with multilevel laminectomy. Electronically Signed   By: Tiburcio Pea M.D.   On: 01/15/2024 11:44   CT Head Wo Contrast Result Date: 01/15/2024 CLINICAL DATA:  Head trauma, minor (Age >= 65y); Neck trauma (Age >= 65y); Facial trauma, blunt EXAM: CT HEAD WITHOUT CONTRAST CT MAXILLOFACIAL WITHOUT CONTRAST CT CERVICAL SPINE WITHOUT CONTRAST TECHNIQUE: Multidetector CT imaging of the head, cervical spine, and maxillofacial structures were performed using the standard protocol without intravenous contrast. Multiplanar CT image reconstructions of the cervical spine and maxillofacial structures were also generated. RADIATION DOSE REDUCTION: This exam was performed according to the departmental dose-optimization program which includes automated exposure control, adjustment of the mA and/or kV according to patient size and/or use of iterative reconstruction technique. COMPARISON:  12/09/2021 FINDINGS: CT HEAD FINDINGS Brain: No evidence of acute infarction, hemorrhage, hydrocephalus, extra-axial collection or mass lesion/mass effect. Patchy low-density changes within the periventricular and subcortical white matter most compatible with chronic microvascular ischemic change. Moderate diffuse cerebral volume loss. Vascular: No hyperdense vessel or unexpected calcification. Skull: Prior left parietal craniotomy.  No acute calvarial fracture. Other: Negative for scalp hematoma. CT MAXILLOFACIAL FINDINGS Osseous: No acute maxillofacial bone fracture. Bony orbital walls are intact. Mandible intact. Temporomandibular joints are aligned without dislocation. Orbits: Negative. No traumatic or inflammatory finding. Sinuses: Clear. Soft tissues: Negative. CT CERVICAL SPINE FINDINGS Alignment: Facet joints are aligned without dislocation or traumatic listhesis. Dens and lateral  masses are aligned. Skull base and vertebrae: No acute fracture. No primary bone lesion or focal pathologic process. Soft tissues and spinal canal: No prevertebral fluid or swelling. No visible canal hematoma. Disc levels:  Moderate multilevel cervical spondylosis. Upper chest: Included lung apices are clear. Other: Bilateral carotid atherosclerosis. IMPRESSION: 1. No  acute intracranial abnormality. 2. No acute maxillofacial bone fracture. 3. No acute fracture or subluxation of the cervical spine. Electronically Signed   By: Duanne Guess D.O.   On: 01/15/2024 11:42   CT Cervical Spine Wo Contrast Result Date: 01/15/2024 CLINICAL DATA:  Head trauma, minor (Age >= 65y); Neck trauma (Age >= 65y); Facial trauma, blunt EXAM: CT HEAD WITHOUT CONTRAST CT MAXILLOFACIAL WITHOUT CONTRAST CT CERVICAL SPINE WITHOUT CONTRAST TECHNIQUE: Multidetector CT imaging of the head, cervical spine, and maxillofacial structures were performed using the standard protocol without intravenous contrast. Multiplanar CT image reconstructions of the cervical spine and maxillofacial structures were also generated. RADIATION DOSE REDUCTION: This exam was performed according to the departmental dose-optimization program which includes automated exposure control, adjustment of the mA and/or kV according to patient size and/or use of iterative reconstruction technique. COMPARISON:  12/09/2021 FINDINGS: CT HEAD FINDINGS Brain: No evidence of acute infarction, hemorrhage, hydrocephalus, extra-axial collection or mass lesion/mass effect. Patchy low-density changes within the periventricular and subcortical white matter most compatible with chronic microvascular ischemic change. Moderate diffuse cerebral volume loss. Vascular: No hyperdense vessel or unexpected calcification. Skull: Prior left parietal craniotomy.  No acute calvarial fracture. Other: Negative for scalp hematoma. CT MAXILLOFACIAL FINDINGS Osseous: No acute maxillofacial bone  fracture. Bony orbital walls are intact. Mandible intact. Temporomandibular joints are aligned without dislocation. Orbits: Negative. No traumatic or inflammatory finding. Sinuses: Clear. Soft tissues: Negative. CT CERVICAL SPINE FINDINGS Alignment: Facet joints are aligned without dislocation or traumatic listhesis. Dens and lateral masses are aligned. Skull base and vertebrae: No acute fracture. No primary bone lesion or focal pathologic process. Soft tissues and spinal canal: No prevertebral fluid or swelling. No visible canal hematoma. Disc levels:  Moderate multilevel cervical spondylosis. Upper chest: Included lung apices are clear. Other: Bilateral carotid atherosclerosis. IMPRESSION: 1. No acute intracranial abnormality. 2. No acute maxillofacial bone fracture. 3. No acute fracture or subluxation of the cervical spine. Electronically Signed   By: Duanne Guess D.O.   On: 01/15/2024 11:42   CT Maxillofacial Wo Contrast Result Date: 01/15/2024 CLINICAL DATA:  Head trauma, minor (Age >= 65y); Neck trauma (Age >= 65y); Facial trauma, blunt EXAM: CT HEAD WITHOUT CONTRAST CT MAXILLOFACIAL WITHOUT CONTRAST CT CERVICAL SPINE WITHOUT CONTRAST TECHNIQUE: Multidetector CT imaging of the head, cervical spine, and maxillofacial structures were performed using the standard protocol without intravenous contrast. Multiplanar CT image reconstructions of the cervical spine and maxillofacial structures were also generated. RADIATION DOSE REDUCTION: This exam was performed according to the departmental dose-optimization program which includes automated exposure control, adjustment of the mA and/or kV according to patient size and/or use of iterative reconstruction technique. COMPARISON:  12/09/2021 FINDINGS: CT HEAD FINDINGS Brain: No evidence of acute infarction, hemorrhage, hydrocephalus, extra-axial collection or mass lesion/mass effect. Patchy low-density changes within the periventricular and subcortical white matter  most compatible with chronic microvascular ischemic change. Moderate diffuse cerebral volume loss. Vascular: No hyperdense vessel or unexpected calcification. Skull: Prior left parietal craniotomy.  No acute calvarial fracture. Other: Negative for scalp hematoma. CT MAXILLOFACIAL FINDINGS Osseous: No acute maxillofacial bone fracture. Bony orbital walls are intact. Mandible intact. Temporomandibular joints are aligned without dislocation. Orbits: Negative. No traumatic or inflammatory finding. Sinuses: Clear. Soft tissues: Negative. CT CERVICAL SPINE FINDINGS Alignment: Facet joints are aligned without dislocation or traumatic listhesis. Dens and lateral masses are aligned. Skull base and vertebrae: No acute fracture. No primary bone lesion or focal pathologic process. Soft tissues and spinal canal: No prevertebral fluid or swelling.  No visible canal hematoma. Disc levels:  Moderate multilevel cervical spondylosis. Upper chest: Included lung apices are clear. Other: Bilateral carotid atherosclerosis. IMPRESSION: 1. No acute intracranial abnormality. 2. No acute maxillofacial bone fracture. 3. No acute fracture or subluxation of the cervical spine. Electronically Signed   By: Duanne Guess D.O.   On: 01/15/2024 11:42   Patient Profile: Kayn Haymore. Chubbuck is a 78 year old male with a past medical history of hypertension, nonobstructive CAD, AS s/p TAVR 01/2022, DM type II, CKD stage IV, rheumatoid arthritis, BPH, IDA and colon polyps. S/P cholecystectomy 08/2023. He presented to [facility] ED 01/15/2024 with suspected UTI and recent multiple mechanical fall at home.   Assessment / Plan:  78 year old male with a history of IDA admitted 01/15/2024 with suspected UTI, mechanical falls and profound anemia. Admission Hg 6.5 -> 6.1 (base line Hg 8 - 10). FOBT positive. Transfused 2 units of PRBCs -> Hg 8.5. Prior work up for IDA by Dr. Barron Alvine 08/2020 included an EGD which showed grade I esophageal  varices (downhill  varices) without gastric varices or evidence of portal hypertensive gastropathy. Duodenal biopsies negative for celiac disease. Colonoscopy identified two small tubular adenomatous polyps removed from the transverse colon, sigmoid diverticulosis and internal hemorrhoids. No overt GI bleeding. He passed a normal brown stool this morning.  -Clear liquid diet -Transfuse for Hg < 8 -H/H 10 am -Defer endoscopic recommendations to Dr. Marina Goodell  AKI of CKD. Admission Cr 2.49 (baseline Cr 1.5) -> 2.47. -IV fluids per the hospitalist  -Consider renal consult   Hyponatremia. Na+ 131.   Liver mass per CTAP without contrast identified an ill-defined 4.2 x 4.8 cm mass in the right hepatic lobe which is concerning for neoplastic process with an additional smaller approximately 1 cm sized hypoattenuating structure in the right hepatic lobe and a 1.4 x 1.7 soft tissue nodule in the RUQ possibly representing metastatic deposit. Prior abdominal MRI w/wo contrast 03/17/2022 identified a 13mm right liver hemangioma. Abdominal MRI w/wo contrast 08/2023 showed a benign hemangioma in the right liver lobe. LFTs not obtained this admission. Hepatis B surface antigen negative and Hep C antibody nonreactive 04/2020. Hemodynamically stable.  -Hepatic panel, PT/INR -Liver MRI w/wo contrast ordered by the hospitalist  -May require liver biopsy, await liver MRI results   Mechanical falls at home. Head without acute intracranial abnormality. Cervical spine and lumbar spine CT negative for acute injury/fracture.   History of colon polyps. Colonoscopy 08/2020 identified two tubular adenomatous polyps removed the transverse colon.  -Next colon polyps surveillance colonoscopy due 08/2027  Nonobstructive CAD per cardiac catheterization 11/2021. LV EF 55 - 60% per ECHO 01/2023.   History of AS s/p TAVR 01/2022    Principal Problem:   GI bleed     LOS: 1 day   Arnaldo Natal  01/16/2024, 8:58 am  GI  ATTENDING  Interval history data reviewed.  Agree with interval progress note as outlined above.  Patient with acute on chronic anemia.  No obvious overt GI bleeding though Hemoccult positive.  Has had workup for iron deficiency in the past (November 2021) as outlined.  Seems to have establish care thereafter outside of our group.  In any event, agree with advanced imaging of the liver to see if this hepatic abnormality is consistent with metastatic cancer versus hematoma versus other.  The plan to be determined available.  I suspect that he may need upper endoscopy, but we shall see.  Will follow.  Jonny Ruiz  Lanelle Bal., M.D. [facility] of Gastroenterology

## 2024-01-16 NOTE — Assessment & Plan Note (Signed)
-   On Enbrel at home -Intermittent pain medications, database reviewed

## 2024-01-16 NOTE — Assessment & Plan Note (Addendum)
-   Possible upper GIB versus anemia from underlying suspected malignancy -Hemoglobin 6.5 g/dL on admission.  He does note dark stools been going on for a while at home; there has been associated weight loss as well - Received 2 units PRBC on admission initially  - GI consulted on admission also - EGD on 4/1: Grade 1 esophageal varices, Schatzki ring at GE junction.  Patchy erythematous mucosa in stomach with no bleeding - Biopsies suggestive of iron pill gastritis, chronic peptic duodenitis.  GI recommending ENT evaluation (planning for outpatient follow up - follow up placed in discharge instructions), IR evaluation of hepatic lesion (s/p biopsy).  (See report)

## 2024-01-16 NOTE — Plan of Care (Signed)
   Problem: Clinical Measurements: Goal: Will remain free from infection Outcome: Progressing Goal: Diagnostic test results will improve Outcome: Progressing

## 2024-01-16 NOTE — H&P (View-Only) (Signed)
 [facility] Progress Note  CC: Anemia, positive FOBT and liver mass  Subjective: He denies having any nausea or vomiting.  No abdominal pain.  He just passed a normal formed brown stool as reported by the nursing staff.  He denies passing any bloody or black stools prior to admission.  No chest pain or shortness of breath.  No family at the bedside.   Objective:  Vital signs in last 24 hours: Temp:  [97.5 F (36.4 C)-98.7 F (37.1 C)] 98.3 F (36.8 C) (03/31 0434) Pulse Rate:  [69-97] 91 (03/31 0434) Resp:  [10-20] 17 (03/31 0434) BP: (135-184)/(62-130) 171/75 (03/31 0434) SpO2:  [93 %-100 %] 97 % (03/31 0434) Weight:  [97 kg] 97 kg (03/30 0924) Last BM Date : 01/15/24 General: Fatigued appearing 78 year old male in no acute distress. Head: Scattered facial ecchymosis including right orbital area. Eyes: No scleral icterus.  Pupils equal and reactive. Heart: Regular rate and rhythm, no murmurs. Pulm: Breath sounds clear throughout. Abdomen: Soft, nondistended.  Mild tenderness to the epigastric, RUQ and RLQ without rebound or guarding.  No hepatosplenomegaly.  No palpable mass.  Positive bowel sounds to all 4 quadrants.  No bruit. Extremities: No edema. Neurologic:  Alert and  oriented x 4.  Speech is clear.  Moves all extremities equally. Psych:  Alert and cooperative. Normal mood and affect.  Intake/Output from previous day: 03/30 0701 - 03/31 0700 In: 1956.2 [P.O.:930; I.V.:236.4; Blood:692; IV Piggyback:97.8] Out: 1675 [Urine:1675] Intake/Output this shift: No intake/output data recorded.  Lab Results: Recent Labs    01/15/24 0931 01/15/24 1359 01/16/24 0425  WBC 11.2*  --  12.1*  HGB 6.5* 6.1* 8.5*  HCT 20.0* 18.1* 25.8*  PLT 433*  --  418*   BMET Recent Labs    01/15/24 0931 01/16/24 0425  NA 131* 131*  K 4.7 3.8  CL 102 105  CO2 17* 14*  GLUCOSE 122* 122*  BUN 49* 43*  CREATININE 2.49* 2.47*  CALCIUM 8.6* 8.8*   LFT No results  for input(s): "PROT", "ALBUMIN", "AST", "ALT", "ALKPHOS", "BILITOT", "BILIDIR", "IBILI" in the last 72 hours. PT/INR No results for input(s): "LABPROT", "INR" in the last 72 hours. Hepatitis Panel No results for input(s): "HEPBSAG", "HCVAB", "HEPAIGM", "HEPBIGM" in the last 72 hours.  CT CHEST ABDOMEN PELVIS WO CONTRAST Result Date: 01/15/2024 CLINICAL DATA:  Abdominal trauma, blunt. Multiple falls in last 2 weeks. Worsening urinary frequency. Dysuria. * Tracking Code: BO * EXAM: CT CHEST, ABDOMEN AND PELVIS WITHOUT CONTRAST TECHNIQUE: Multidetector CT imaging of the chest, abdomen and pelvis was performed following the standard protocol without IV contrast. RADIATION DOSE REDUCTION: This exam was performed according to the departmental dose-optimization program which includes automated exposure control, adjustment of the mA and/or kV according to patient size and/or use of iterative reconstruction technique. COMPARISON:  CT scan chest, abdomen and pelvis from 12/18/2021 and CT scan abdomen and pelvis from 08/21/2023 FINDINGS: CT CHEST FINDINGS Cardiovascular: Normal cardiac size. No pericardial effusion. No aortic aneurysm. There are coronary artery calcifications, in keeping with coronary artery disease. Prosthetic aortic valve noted. There is dense mitral annulus calcification. Mediastinum/Nodes: Visualized thyroid gland appears grossly unremarkable. No solid / cystic mediastinal masses. The esophagus is nondistended precluding optimal assessment. No mediastinal or axillary lymphadenopathy by size criteria. Evaluation of bilateral hila is limited due to lack on intravenous contrast: however, no large hilar lymphadenopathy identified. Lungs/Pleura: The central tracheo-bronchial tree is patent. There are patchy areas of linear, plate-like atelectasis and/or scarring  throughout bilateral lungs. No mass or consolidation. No pleural effusion or pneumothorax. There is a subpleural solid noncalcified 6 x 7 mm  nodule in the left lung lower lobe (series 312, image 58), which is grossly unchanged since the prior study from 08/21/2023 but new since the prior study from 12/18/2021. There is a stable 2 mm calcified granuloma in the left lung lower lobe. Musculoskeletal: The visualized soft tissues of the chest wall are grossly unremarkable. No suspicious osseous lesions. There are mild multilevel degenerative changes in the visualized spine. CT ABDOMEN PELVIS FINDINGS Hepatobiliary: The liver is normal in size. Non-cirrhotic configuration. There is a new ill-defined heterogeneous hypoattenuating approximately 4.2 x 4.8 cm mass in the right hepatic lobe, segment 5/6, which is new since the prior study and highly concerning for neoplastic process. There is an additional smaller approximately 1 cm sized hypoattenuating structure in the right hepatic lobe, segment 8, which is also concerning for neoplastic process. Further evaluation with multiphasic contrast-enhanced MRI or CT scan abdomen as per liver mass protocol is recommended. No intrahepatic or extrahepatic bile duct dilation. Gallbladder is surgically absent. Pancreas: Unremarkable. No pancreatic ductal dilatation or surrounding inflammatory changes. Spleen: Within normal limits. No focal lesion. Adrenals/Urinary Tract: Adrenal glands are unremarkable. No suspicious renal mass within the limitations of this unenhanced exam. There is a nearly completely exophytic 2.1 x 2.3 cm structure arising from the left kidney upper pole, with internal CT attenuation of 29 Hounsfield units. This is incompletely characterized on this unenhanced exam but is present since the prior study and favored to represent a proteinaceous/hemorrhagic cyst. This can also be better characterized on the MRI exam. No nephroureterolithiasis or obstructive uropathy. Unremarkable urinary bladder. Stomach/Bowel: There is a small sliding hiatal hernia. No disproportionate dilation of the small or large bowel  loops. No evidence of abnormal bowel wall thickening or inflammatory changes. The appendix is unremarkable. There are multiple diverticula mainly in the left hemi colon, without imaging signs of diverticulitis. Vascular/Lymphatic: No ascites or pneumoperitoneum. There is an irregular approximately 1.4 x 1.7 cm soft tissue attenuation nodule in the right upper quadrant abutting the right inferior liver surface, favored to represent metastatic deposit. No abdominal or pelvic lymphadenopathy, by size criteria. No aneurysmal dilation of the major abdominal arteries. There are minimal peripheral atherosclerotic vascular calcifications of the aorta and its major branches. Reproductive: Normal size prostate. Symmetric seminal vesicles. Other: There are bilateral small fat containing inguinal hernias. The soft tissues and abdominal wall are otherwise unremarkable. Musculoskeletal: There is a subcentimeter sized lytic lesion in the right iliac bone (610, image 95), which is incompletely characterized on the current exam but present since the prior study from March 2023 and favored benign. There are mild multilevel degenerative changes in the visualized spine. Lower thoracic, lumbar and upper sacral spinal fixation hardware noted including bilateral SI joint arthrodesis. IMPRESSION: 1. No acute traumatic injury to the chest, abdomen or pelvis. 2. There is a new ill-defined heterogeneous hypoattenuating approximately 4.2 x 4.8 cm mass in the right hepatic lobe, segment 5/6, which is new since the prior study and highly concerning for neoplastic process. There is an additional smaller approximately 1 cm sized hypoattenuating structure in the right hepatic lobe, segment 8, which is also concerning for neoplastic process. Further evaluation with multiphasic contrast-enhanced MRI or CT scan abdomen as per liver mass protocol is recommended. 3. There is an irregular approximately 1.4 x 1.7 cm soft tissue attenuation nodule in the  right upper quadrant abutting the right  inferior liver surface, favored to represent metastatic deposit. 4. There is a subpleural solid noncalcified 6 x 7 mm nodule in the left lung lower lobe, which is grossly unchanged since the prior study but new since the prior study from 12/18/2021. This is indeterminate. 5. Multiple other nonacute observations, as described above. Aortic Atherosclerosis (ICD10-I70.0). Electronically Signed   By: Jules Schick M.D.   On: 01/15/2024 11:56   CT L-SPINE NO CHARGE Result Date: 01/15/2024 CLINICAL DATA:  Worsening urinary frequency since Wednesday. Dysuria. History of 4 falls in the last 2 weeks. EXAM: CT Lumbar Spine without contrast TECHNIQUE: Technique: Multiplanar CT images of the lumbar spine were reconstructed from contemporary CT of the Abdomen and Pelvis. RADIATION DOSE REDUCTION: This exam was performed according to the departmental dose-optimization program which includes automated exposure control, adjustment of the mA and/or kV according to patient size and/or use of iterative reconstruction technique. CONTRAST:  None COMPARISON:  Lumbar radiography 03/30/2020 FINDINGS: Segmentation: 5 lumbar type vertebrae Alignment: No acute malalignment.  Dextroscoliosis. Vertebrae: Thoracic (at least T10) to pelvic fusion solid arthrodesis is seen at L3-L5. There is vacuum phenomenon in the L5-S1 disc space with lucency around the S1 and pelvic screws. Intervertebral gas also seen at upper lumbar levels including L2-3. No acute fracture or aggressive bone lesion. Hip osteoarthritis with joint space narrowing on the left more than right. Paraspinal and other soft tissues: Extensive postoperative scarring and fatty muscular atrophy at levels of broad laminectomy in the lumbar spine. Disc levels: No high-grade bony spinal stenosis is seen after laminectomy. Degenerative foraminal impingement on the left at L2-3 which appears advanced. Other foraminal narrowings are  mild-to-moderate. IMPRESSION: 1. No acute finding. 2. 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 3. Advanced degeneration with multilevel laminectomy. Electronically Signed   By: Tiburcio Pea M.D.   On: 01/15/2024 11:44   CT Head Wo Contrast Result Date: 01/15/2024 CLINICAL DATA:  Head trauma, minor (Age >= 65y); Neck trauma (Age >= 65y); Facial trauma, blunt EXAM: CT HEAD WITHOUT CONTRAST CT MAXILLOFACIAL WITHOUT CONTRAST CT CERVICAL SPINE WITHOUT CONTRAST TECHNIQUE: Multidetector CT imaging of the head, cervical spine, and maxillofacial structures were performed using the standard protocol without intravenous contrast. Multiplanar CT image reconstructions of the cervical spine and maxillofacial structures were also generated. RADIATION DOSE REDUCTION: This exam was performed according to the departmental dose-optimization program which includes automated exposure control, adjustment of the mA and/or kV according to patient size and/or use of iterative reconstruction technique. COMPARISON:  12/09/2021 FINDINGS: CT HEAD FINDINGS Brain: No evidence of acute infarction, hemorrhage, hydrocephalus, extra-axial collection or mass lesion/mass effect. Patchy low-density changes within the periventricular and subcortical white matter most compatible with chronic microvascular ischemic change. Moderate diffuse cerebral volume loss. Vascular: No hyperdense vessel or unexpected calcification. Skull: Prior left parietal craniotomy.  No acute calvarial fracture. Other: Negative for scalp hematoma. CT MAXILLOFACIAL FINDINGS Osseous: No acute maxillofacial bone fracture. Bony orbital walls are intact. Mandible intact. Temporomandibular joints are aligned without dislocation. Orbits: Negative. No traumatic or inflammatory finding. Sinuses: Clear. Soft tissues: Negative. CT CERVICAL SPINE FINDINGS Alignment: Facet joints are aligned without dislocation or traumatic listhesis. Dens and lateral  masses are aligned. Skull base and vertebrae: No acute fracture. No primary bone lesion or focal pathologic process. Soft tissues and spinal canal: No prevertebral fluid or swelling. No visible canal hematoma. Disc levels:  Moderate multilevel cervical spondylosis. Upper chest: Included lung apices are clear. Other: Bilateral carotid atherosclerosis. IMPRESSION: 1. No  acute intracranial abnormality. 2. No acute maxillofacial bone fracture. 3. No acute fracture or subluxation of the cervical spine. Electronically Signed   By: Duanne Guess D.O.   On: 01/15/2024 11:42   CT Cervical Spine Wo Contrast Result Date: 01/15/2024 CLINICAL DATA:  Head trauma, minor (Age >= 65y); Neck trauma (Age >= 65y); Facial trauma, blunt EXAM: CT HEAD WITHOUT CONTRAST CT MAXILLOFACIAL WITHOUT CONTRAST CT CERVICAL SPINE WITHOUT CONTRAST TECHNIQUE: Multidetector CT imaging of the head, cervical spine, and maxillofacial structures were performed using the standard protocol without intravenous contrast. Multiplanar CT image reconstructions of the cervical spine and maxillofacial structures were also generated. RADIATION DOSE REDUCTION: This exam was performed according to the departmental dose-optimization program which includes automated exposure control, adjustment of the mA and/or kV according to patient size and/or use of iterative reconstruction technique. COMPARISON:  12/09/2021 FINDINGS: CT HEAD FINDINGS Brain: No evidence of acute infarction, hemorrhage, hydrocephalus, extra-axial collection or mass lesion/mass effect. Patchy low-density changes within the periventricular and subcortical white matter most compatible with chronic microvascular ischemic change. Moderate diffuse cerebral volume loss. Vascular: No hyperdense vessel or unexpected calcification. Skull: Prior left parietal craniotomy.  No acute calvarial fracture. Other: Negative for scalp hematoma. CT MAXILLOFACIAL FINDINGS Osseous: No acute maxillofacial bone  fracture. Bony orbital walls are intact. Mandible intact. Temporomandibular joints are aligned without dislocation. Orbits: Negative. No traumatic or inflammatory finding. Sinuses: Clear. Soft tissues: Negative. CT CERVICAL SPINE FINDINGS Alignment: Facet joints are aligned without dislocation or traumatic listhesis. Dens and lateral masses are aligned. Skull base and vertebrae: No acute fracture. No primary bone lesion or focal pathologic process. Soft tissues and spinal canal: No prevertebral fluid or swelling. No visible canal hematoma. Disc levels:  Moderate multilevel cervical spondylosis. Upper chest: Included lung apices are clear. Other: Bilateral carotid atherosclerosis. IMPRESSION: 1. No acute intracranial abnormality. 2. No acute maxillofacial bone fracture. 3. No acute fracture or subluxation of the cervical spine. Electronically Signed   By: Duanne Guess D.O.   On: 01/15/2024 11:42   CT Maxillofacial Wo Contrast Result Date: 01/15/2024 CLINICAL DATA:  Head trauma, minor (Age >= 65y); Neck trauma (Age >= 65y); Facial trauma, blunt EXAM: CT HEAD WITHOUT CONTRAST CT MAXILLOFACIAL WITHOUT CONTRAST CT CERVICAL SPINE WITHOUT CONTRAST TECHNIQUE: Multidetector CT imaging of the head, cervical spine, and maxillofacial structures were performed using the standard protocol without intravenous contrast. Multiplanar CT image reconstructions of the cervical spine and maxillofacial structures were also generated. RADIATION DOSE REDUCTION: This exam was performed according to the departmental dose-optimization program which includes automated exposure control, adjustment of the mA and/or kV according to patient size and/or use of iterative reconstruction technique. COMPARISON:  12/09/2021 FINDINGS: CT HEAD FINDINGS Brain: No evidence of acute infarction, hemorrhage, hydrocephalus, extra-axial collection or mass lesion/mass effect. Patchy low-density changes within the periventricular and subcortical white matter  most compatible with chronic microvascular ischemic change. Moderate diffuse cerebral volume loss. Vascular: No hyperdense vessel or unexpected calcification. Skull: Prior left parietal craniotomy.  No acute calvarial fracture. Other: Negative for scalp hematoma. CT MAXILLOFACIAL FINDINGS Osseous: No acute maxillofacial bone fracture. Bony orbital walls are intact. Mandible intact. Temporomandibular joints are aligned without dislocation. Orbits: Negative. No traumatic or inflammatory finding. Sinuses: Clear. Soft tissues: Negative. CT CERVICAL SPINE FINDINGS Alignment: Facet joints are aligned without dislocation or traumatic listhesis. Dens and lateral masses are aligned. Skull base and vertebrae: No acute fracture. No primary bone lesion or focal pathologic process. Soft tissues and spinal canal: No prevertebral fluid or swelling.  No visible canal hematoma. Disc levels:  Moderate multilevel cervical spondylosis. Upper chest: Included lung apices are clear. Other: Bilateral carotid atherosclerosis. IMPRESSION: 1. No acute intracranial abnormality. 2. No acute maxillofacial bone fracture. 3. No acute fracture or subluxation of the cervical spine. Electronically Signed   By: Duanne Guess D.O.   On: 01/15/2024 11:42   Patient Profile: Kayn Haymore. Chubbuck is a 78 year old male with a past medical history of hypertension, nonobstructive CAD, AS s/p TAVR 01/2022, DM type II, CKD stage IV, rheumatoid arthritis, BPH, IDA and colon polyps. S/P cholecystectomy 08/2023. He presented to [facility] ED 01/15/2024 with suspected UTI and recent multiple mechanical fall at home.   Assessment / Plan:  78 year old male with a history of IDA admitted 01/15/2024 with suspected UTI, mechanical falls and profound anemia. Admission Hg 6.5 -> 6.1 (base line Hg 8 - 10). FOBT positive. Transfused 2 units of PRBCs -> Hg 8.5. Prior work up for IDA by Dr. Barron Alvine 08/2020 included an EGD which showed grade I esophageal  varices (downhill  varices) without gastric varices or evidence of portal hypertensive gastropathy. Duodenal biopsies negative for celiac disease. Colonoscopy identified two small tubular adenomatous polyps removed from the transverse colon, sigmoid diverticulosis and internal hemorrhoids. No overt GI bleeding. He passed a normal brown stool this morning.  -Clear liquid diet -Transfuse for Hg < 8 -H/H 10 am -Defer endoscopic recommendations to Dr. Marina Goodell  AKI of CKD. Admission Cr 2.49 (baseline Cr 1.5) -> 2.47. -IV fluids per the hospitalist  -Consider renal consult   Hyponatremia. Na+ 131.   Liver mass per CTAP without contrast identified an ill-defined 4.2 x 4.8 cm mass in the right hepatic lobe which is concerning for neoplastic process with an additional smaller approximately 1 cm sized hypoattenuating structure in the right hepatic lobe and a 1.4 x 1.7 soft tissue nodule in the RUQ possibly representing metastatic deposit. Prior abdominal MRI w/wo contrast 03/17/2022 identified a 13mm right liver hemangioma. Abdominal MRI w/wo contrast 08/2023 showed a benign hemangioma in the right liver lobe. LFTs not obtained this admission. Hepatis B surface antigen negative and Hep C antibody nonreactive 04/2020. Hemodynamically stable.  -Hepatic panel, PT/INR -Liver MRI w/wo contrast ordered by the hospitalist  -May require liver biopsy, await liver MRI results   Mechanical falls at home. Head without acute intracranial abnormality. Cervical spine and lumbar spine CT negative for acute injury/fracture.   History of colon polyps. Colonoscopy 08/2020 identified two tubular adenomatous polyps removed the transverse colon.  -Next colon polyps surveillance colonoscopy due 08/2027  Nonobstructive CAD per cardiac catheterization 11/2021. LV EF 55 - 60% per ECHO 01/2023.   History of AS s/p TAVR 01/2022    Principal Problem:   GI bleed     LOS: 1 day   Arnaldo Natal  01/16/2024, 8:58 am  GI  ATTENDING  Interval history data reviewed.  Agree with interval progress note as outlined above.  Patient with acute on chronic anemia.  No obvious overt GI bleeding though Hemoccult positive.  Has had workup for iron deficiency in the past (November 2021) as outlined.  Seems to have establish care thereafter outside of our group.  In any event, agree with advanced imaging of the liver to see if this hepatic abnormality is consistent with metastatic cancer versus hematoma versus other.  The plan to be determined available.  I suspect that he may need upper endoscopy, but we shall see.  Will follow.  Jonny Ruiz  Lanelle Bal., M.D. [facility] of Gastroenterology

## 2024-01-16 NOTE — Assessment & Plan Note (Signed)
-   On lisinopril at home; hold for now

## 2024-01-16 NOTE — TOC Initial Note (Signed)
 Transition of Care [facility]) - Initial/Assessment Note    Patient Details  Name: Tommy Green MRN: [id number] Date of Birth: 1946/08/19  Transition of Care [facility]) CM/SW Contact:    Adrian Prows, RN Phone Number: 01/16/2024, 12:31 PM  Clinical Narrative:                 Tommy Green w/ pt and wife Tommy Green [phone]) in room; pt says he lives at home; he plans to return at d/c; pt's wife will provide transportation; pt verified insurance/PCP; he denies SDOH risks; pt says he has a walker, and shower chair; there are grab bars in shower; pt says he does not have HH services, or home oxygen; TOC will follow.  Expected Discharge Plan: Home/Self Care Barriers to Discharge: Continued Medical Work up   Patient Goals and CMS Choice Patient states their goals for this hospitalization and ongoing recovery are:: home CMS Medicare.gov Compare Post Acute Care list provided to:: Patient   [place] ownership interest in [facility].provided to:: Patient    Expected Discharge Plan and Services   Discharge Planning Services: CM Consult   Living arrangements for the past 2 months: Single Family Home                                      Prior Living Arrangements/Services Living arrangements for the past 2 months: Single Family Home Lives with:: Spouse Patient language and need for interpreter reviewed:: Yes Do you feel safe going back to the place where you live?: Yes      Need for Family Participation in Patient Care: Yes (Comment) Care giver support system in place?: Yes (comment) Current home services: DME (walker, shower chair) Criminal Activity/Legal Involvement Pertinent to Current Situation/Hospitalization: No - Comment as needed  Activities of Daily Living   ADL Screening (condition at time of admission) Independently performs ADLs?: Yes (appropriate for developmental age) Is the patient deaf or have difficulty hearing?: No Does the patient have  difficulty seeing, even when wearing glasses/contacts?: No Does the patient have difficulty concentrating, remembering, or making decisions?: Yes  Permission Sought/Granted Permission sought to share information with : Case Manager Permission granted to share information with : Yes, Verbal Permission Granted  Share Information with NAME: Case Manager     Permission granted to share info w Relationship: [name] (spouse) [phone]     Emotional Assessment Appearance:: Appears stated age Attitude/Demeanor/Rapport: Gracious Affect (typically observed): Accepting Orientation: : Oriented to Self, Oriented to Place, Oriented to  Time, Oriented to Situation Alcohol / Substance Use: Not Applicable Psych Involvement: No (comment)  Admission diagnosis:  Weakness [R53.1] GI bleed [K92.2] AKI (acute kidney injury) (HCC) [N17.9] Urinary tract infection with hematuria, site unspecified [N39.0, R31.9] Gastrointestinal hemorrhage, unspecified gastrointestinal hemorrhage type [K92.2] Anemia, unspecified type [D64.9] Patient Active Problem List   Diagnosis Date Noted   GI bleed 01/15/2024   Depression, major, single episode, in partial remission (HCC) 10/05/2023   BPH associated with nocturia 10/05/2023   Statin myopathy 06/15/2023   IDA (iron deficiency anemia) 07/06/2022   Current mild episode of major depressive disorder without prior episode (HCC) 05/17/2022   Other chronic pain 03/05/2022   Bilateral lower extremity edema 03/05/2022   Severe aortic stenosis 01/26/2022   S/P TAVR (transcatheter aortic valve replacement) 01/26/2022   Microcytic anemia 12/10/2021   Obesity (BMI 30-39.9) 12/10/2021   Near syncope 12/09/2021   Aortic  stenosis 12/09/2021   Rheumatoid arthritis (HCC) 12/09/2021   Foot drop, left foot 10/02/2021   History of colonic polyps 10/02/2021   History of rheumatic fever 10/02/2021   Stress fracture of left foot 09/03/2021   Gabapentin overdose, accidental  or unintentional, initial encounter 03/08/2021   TIA (transient ischemic attack) 03/05/2021   Body mass index (BMI) 36.0-36.9, adult 10/03/2020   Subdural hemorrhage following injury without open intracranial wound and with prolonged loss of consciousness (more than 24 hours) without return to pre-existing conscious level (HCC) 10/03/2020   AKI (acute kidney injury) (HCC)    Acute encephalopathy    SDH (subdural hematoma) (HCC) 10/24/2019   Cyclic vomiting syndrome 08/06/2019   Neuropathy 12/25/2018   AK (actinic keratosis) 08/24/2018   Neoplasm of uncertain behavior 07/27/2018   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   Tendinopathy of right gluteus medius 07/13/2018   Tendinopathy of left gluteus medius 07/13/2018   Squamous cell carcinoma in situ (SCCIS) of skin 03/24/2018   Vitamin D deficiency 11/29/2017   Stage 3 chronic kidney disease (HCC) 10/28/2017   Primary osteoarthritis of right hip 09/11/2017   Essential hypertension 07/18/2017   Hyperlipidemia associated with type 2 diabetes mellitus (HCC) 07/18/2017   Type 2 diabetes mellitus with diabetic neuropathy, unspecified (HCC) 07/18/2017   Heart murmur 07/18/2017   Hypertriglyceridemia 07/18/2017   History of stroke 07/18/2017   DDD (degenerative disc disease), lumbar 06/15/2017   Iron deficiency anemia 06/15/2017   Stroke (HCC) 06/15/2017   Encounter for hepatitis C screening test for low risk patient 06/30/2016   Anemia 06/19/2016   Microalbuminuria due to type 2 diabetes mellitus (HCC) 03/19/2016   Hypertrophy of inferior nasal turbinate 12/20/2015   Disease of nasal cavity and sinuses 12/10/2015   Degenerative tear of acetabular labrum of left hip 11/04/2015   History of tobacco abuse 05/15/2015   Morbid (severe) obesity due to excess calories (HCC) 04/11/2015   Disorder of both eustachian tubes 01/27/2015   Maxillary sinusitis 01/13/2015   Lipoprotein deficiency 01/31/2014   Gastro-esophageal reflux disease without  esophagitis 11/02/2013   Rheumatic fever 11/02/2013   Erectile dysfunction associated with type 2 diabetes mellitus (HCC) 07/31/2013   Type 2 diabetes mellitus with hyperglycemia, without long-term current use of insulin (HCC) 11/16/2012   PCP:  [name], DO Pharmacy:   [id number] - [place], [place] - [address] [place] [address] Phone: [phone] Fax: [phone]     Social Drivers of Health (SDOH) Social History: SDOH Screenings   Food Insecurity: No Food Insecurity (01/16/2024)  Housing: Low Risk  (01/16/2024)  Transportation Needs: No Transportation Needs (01/16/2024)  Utilities: Not At Risk (01/16/2024)  Alcohol Screen: Low Risk  (11/04/2023)  Depression (PHQ2-9): Low Risk  (01/03/2024)  Financial Resource Strain: Low Risk  (11/04/2023)  Physical Activity: Inactive (11/04/2023)  Social Connections: Socially Integrated (01/15/2024)  Stress: No Stress Concern Present (11/04/2023)  Tobacco Use: Medium Risk (01/15/2024)  Health Literacy: Adequate Health Literacy (11/04/2023)   SDOH Interventions: Food Insecurity Interventions: Intervention Not Indicated, Inpatient TOC Housing Interventions: Intervention Not Indicated, Inpatient TOC Transportation Interventions: Intervention Not Indicated, Inpatient TOC Utilities Interventions: Intervention Not Indicated, Inpatient TOC   Readmission Risk Interventions    01/16/2024   12:29 PM  Readmission Risk Prevention Plan  Transportation Screening Complete  PCP or Specialist Appt within 5-7 Days Complete  Home Care Screening Complete  Medication Review (RN CM) Complete

## 2024-01-16 NOTE — Progress Notes (Signed)
 Progress Note    Tommy Green   WUJ:[id number]  DOB: [birth year]  DOA: 01/15/2024     1 PCP: [name], DO  Initial CC: weakness, falls, dark stools  Hospital Course: [name] is a 78 yo male with PMH malignant melanoma, DMII, RA, GERD, hx CVA, HLD who presented with weakness, recurrent falls, and some persistent dark stools.  He had fallen about 1 week prior in the bathroom and struck his right face resulting in a bruised eye as well.  On workup he was noted to have Hgb 6.5 g/dL and underwent 2 units PRBC on admission. Multiple imaging studies were obtained due to his falls and no traumatic injuries were noted. He was however noted to have a right hepatic lobe lesion measuring 4.2 x 4.8 cm along with a left lower lobe nodule measuring 6 x 7 mm. GI was also consulted due to concern for GI bleed.  He has a known history of esophageal varices,  Schatzki ring, and gastric polyps on last EGD in Nov 2021.   He was admitted for ongoing GI evaluation and further workup regarding masses noted on scans.  Interval History:  Wife present bedside this morning.  Their biggest concern has been his weakness at home.  Also reviewed his anemia on admission along with CT findings with liver and lung mass.  They understand next steps are to get MRI liver and then possible biopsy if able.  They continue to discuss whether or not he would want treatment but they seem to think he would not but they still wish to have a diagnosis.  Assessment and Plan: * GI bleed - Possible upper GIB versus anemia from underlying suspected malignancy -Hemoglobin 6.5 g/dL on admission.  He does note dark stools been going on for a while at home; there has been associated weight loss as well - Received 2 units PRBC on admission with hemoglobin improved to 8.9 g/dL this morning -Follow-up further GI recommendations  Liver mass - Patient and wife aware of CT findings notably right hepatic lobe mass measuring  4.2 cm x 4.8 cm and left lower lobe lung nodule measuring 6 x 7 mm; CT also mentions suspected nodule measuring 1.4 x 1.7 cm in the right upper quadrant abutting inferior liver surface -For now we will proceed with MRI liver; if amenable, next step would be liver biopsy -Patient and wife still discussing what his ultimate decision would be regarding potential treatment; wife doesn't think he'd want any treatment but they do want a diagnosis   Acute renal failure superimposed on stage 3b chronic kidney disease (HCC) - patient has history of CKD3b. Baseline creat ~ 1.8 - 2.2, eGFR~ 32 - patient presents with increase in creat >0.3 mg/dL above baseline or creat increase >1.5x baseline presumed to have occurred within past 7 days PTA - creat 2.49 on admission - suspect volume depletion but may also progressive CKD given borderline GFR recently and at risk for progression - continue on IVF for now and trend BMP   Rheumatoid arteritis (HCC) - On Enbrel at home -Intermittent pain medications, database reviewed  BPH associated with nocturia - Continue finasteride  S/P TAVR (transcatheter aortic valve replacement) - Prior history of severe AS  Type 2 diabetes mellitus with diabetic neuropathy, unspecified (HCC) - Continue SSI and CBG monitoring for now -Last A1c 5.8% on 01/15/2024 -Hold basal insulin for now  Hyperlipidemia associated with type 2 diabetes mellitus (HCC) - On Zetia, fenofibrate at home  Essential  hypertension - On lisinopril at home; hold for now   Old records reviewed in assessment of this patient  Antimicrobials:   DVT prophylaxis:  SCDs Start: 01/15/24 1558   Code Status:   Code Status: Full Code  Mobility Assessment (Last 72 Hours)     Mobility Assessment     Row Name 01/16/24 0849 01/15/24 2000 01/15/24 1526       Does patient have an order for bedrest or is patient medically unstable No - Continue assessment No - Continue assessment No - Continue  assessment     What is the highest level of mobility based on the progressive mobility assessment? Level 4 (Walks with assist in room) - Balance while marching in place and cannot step forward and back - Complete Level 4 (Walks with assist in room) - Balance while marching in place and cannot step forward and back - Complete Level 4 (Walks with assist in room) - Balance while marching in place and cannot step forward and back - Complete              Barriers to discharge: None Disposition Plan: TBD HH orders placed:  Status is: Inpatient  Objective: Blood pressure (!) 151/80, pulse 89, temperature 98.5 F (36.9 C), temperature source Oral, resp. rate 16, height 5\' 11"  (1.803 m), weight 97 kg, SpO2 96%.  Examination:  Physical Exam Constitutional:      General: He is not in acute distress.    Appearance: Normal appearance.  HENT:     Head: Normocephalic.     Comments: Bruising noted around right eye    Mouth/Throat:     Mouth: Mucous membranes are moist.  Eyes:     Extraocular Movements: Extraocular movements intact.  Cardiovascular:     Rate and Rhythm: Normal rate and regular rhythm.  Pulmonary:     Effort: Pulmonary effort is normal. No respiratory distress.     Breath sounds: Normal breath sounds. No wheezing.  Abdominal:     General: Bowel sounds are normal. There is no distension.     Palpations: Abdomen is soft.     Tenderness: There is no abdominal tenderness.  Musculoskeletal:        General: Normal range of motion.     Cervical back: Normal range of motion and neck supple.  Skin:    General: Skin is warm and dry.  Neurological:     General: No focal deficit present.     Mental Status: He is alert.  Psychiatric:        Mood and Affect: Mood normal.        Behavior: Behavior normal.      Consultants:  G  Procedures:    Data Reviewed: Results for orders placed or performed during the hospital encounter of 01/15/24 (from the past 24 hours)  Hemoglobin  and hematocrit, blood     Status: Abnormal   Collection Time: 01/15/24  1:59 PM  Result Value Ref Range   Hemoglobin 6.1 (LL) 13.0 - 17.0 g/dL   HCT 62.1 (L) 30.8 - 65.7 %  Glucose, capillary     Status: None   Collection Time: 01/15/24  4:37 PM  Result Value Ref Range   Glucose-Capillary 85 70 - 99 mg/dL  Hemoglobin Q4O     Status: Abnormal   Collection Time: 01/15/24  4:49 PM  Result Value Ref Range   Hgb A1c MFr Bld 5.8 (H) 4.8 - 5.6 %   Mean Plasma Glucose 119.76 mg/dL  Prepare RBC (  crossmatch)     Status: None   Collection Time: 01/15/24  4:57 PM  Result Value Ref Range   Order Confirmation      ORDER PROCESSED BY BLOOD BANK Performed at [facility], [address] [place], [place] [address]   Type and screen [facility]     Status: None   Collection Time: 01/15/24  4:57 PM  Result Value Ref Range   ABO/RH(D) O POS    Antibody Screen NEG    Sample Expiration 01/18/2024,2359    Unit Number [id number]    Blood Component Type RED CELLS,LR    Unit division 00    Status of Unit ISSUED,FINAL    Transfusion Status OK TO TRANSFUSE    Crossmatch Result      Compatible Performed at [facility] [address] [place], [place] [address]    Unit Number [id number]    Blood Component Type RBC LR PHER1    Unit division 00    Status of Unit ISSUED,FINAL    Transfusion Status OK TO TRANSFUSE    Crossmatch Result Compatible   Glucose, capillary     Status: Abnormal   Collection Time: 01/15/24  7:58 PM  Result Value Ref Range   Glucose-Capillary 138 (H) 70 - 99 mg/dL  Basic metabolic panel     Status: Abnormal   Collection Time: 01/16/24  4:25 AM  Result Value Ref Range   Sodium 131 (L) 135 - 145 mmol/L   Potassium 3.8 3.5 - 5.1 mmol/L   Chloride 105 98 - 111 mmol/L   CO2 14 (L) 22 - 32 mmol/L   Glucose, Bld 122 (H) 70 - 99 mg/dL   BUN 43 (H) 8 - 23 mg/dL   Creatinine, Ser 1.76 (H) 0.61 - 1.24 mg/dL   Calcium  8.8 (L) 8.9 - 10.3 mg/dL   GFR, Estimated 26 (L) >60 mL/min   Anion gap 12 5 - 15  CBC     Status: Abnormal   Collection Time: 01/16/24  4:25 AM  Result Value Ref Range   WBC 12.1 (H) 4.0 - 10.5 K/uL   RBC 3.15 (L) 4.22 - 5.81 MIL/uL   Hemoglobin 8.5 (L) 13.0 - 17.0 g/dL   HCT 16.0 (L) 73.7 - 10.6 %   MCV 81.9 80.0 - 100.0 fL   MCH 27.0 26.0 - 34.0 pg   MCHC 32.9 30.0 - 36.0 g/dL   RDW 26.9 (H) 48.5 - 46.2 %   Platelets 418 (H) 150 - 400 K/uL   nRBC 0.0 0.0 - 0.2 %  Glucose, capillary     Status: Abnormal   Collection Time: 01/16/24  7:43 AM  Result Value Ref Range   Glucose-Capillary 135 (H) 70 - 99 mg/dL  Hepatic function panel Once     Status: Abnormal   Collection Time: 01/16/24 10:13 AM  Result Value Ref Range   Total Protein 6.9 6.5 - 8.1 g/dL   Albumin 3.0 (L) 3.5 - 5.0 g/dL   AST 26 15 - 41 U/L   ALT 22 0 - 44 U/L   Alkaline Phosphatase 64 38 - 126 U/L   Total Bilirubin 0.7 0.0 - 1.2 mg/dL   Bilirubin, Direct 0.1 0.0 - 0.2 mg/dL   Indirect Bilirubin 0.6 0.3 - 0.9 mg/dL  Protime-INR     Status: Abnormal   Collection Time: 01/16/24 10:13 AM  Result Value Ref Range   Prothrombin Time 16.0 (H) 11.4 - 15.2 seconds  INR 1.3 (H) 0.8 - 1.2  Hemoglobin and hematocrit, blood     Status: Abnormal   Collection Time: 01/16/24 10:13 AM  Result Value Ref Range   Hemoglobin 8.9 (L) 13.0 - 17.0 g/dL   HCT 09.8 (L) 11.9 - 14.7 %  Glucose, capillary     Status: Abnormal   Collection Time: 01/16/24 11:37 AM  Result Value Ref Range   Glucose-Capillary 159 (H) 70 - 99 mg/dL    I have reviewed pertinent nursing notes, vitals, labs, and images as necessary. I have ordered labwork to follow up on as indicated.  I have reviewed the last notes from staff over past 24 hours. I have discussed patient's care plan and test results with nursing staff, CM/SW, and other staff as appropriate.  Time spent: Greater than 50% of the 55 minute visit was spent in counseling/coordination of care  for the patient as laid out in the A&P.   LOS: 1 day   Lewie Chamber, MD [facility] 01/16/2024, 12:54 PM

## 2024-01-16 NOTE — Plan of Care (Signed)
   Problem: Activity: Goal: Risk for activity intolerance will decrease Outcome: Progressing   Problem: Pain Managment: Goal: General experience of comfort will improve and/or be controlled Outcome: Progressing   Problem: Safety: Goal: Ability to remain free from injury will improve Outcome: Progressing

## 2024-01-17 ENCOUNTER — Encounter (HOSPITAL_COMMUNITY)
Admission: EM | Disposition: A | Discharge status: Skilled Nursing Facility | Payer: Self-pay | Source: Home / Self Care | Attending: Family Medicine

## 2024-01-17 ENCOUNTER — Inpatient Hospital Stay (HOSPITAL_COMMUNITY): Admitting: Anesthesiology

## 2024-01-17 ENCOUNTER — Encounter (HOSPITAL_COMMUNITY): Payer: Self-pay | Admitting: Internal Medicine

## 2024-01-17 ENCOUNTER — Telehealth: Payer: Self-pay

## 2024-01-17 ENCOUNTER — Ambulatory Visit: Admitting: Family Medicine

## 2024-01-17 DIAGNOSIS — R160 Hepatomegaly, not elsewhere classified: Secondary | ICD-10-CM | POA: Diagnosis not present

## 2024-01-17 DIAGNOSIS — K922 Gastrointestinal hemorrhage, unspecified: Secondary | ICD-10-CM | POA: Diagnosis not present

## 2024-01-17 DIAGNOSIS — K2950 Unspecified chronic gastritis without bleeding: Secondary | ICD-10-CM | POA: Diagnosis not present

## 2024-01-17 DIAGNOSIS — I10 Essential (primary) hypertension: Secondary | ICD-10-CM

## 2024-01-17 DIAGNOSIS — I8510 Secondary esophageal varices without bleeding: Secondary | ICD-10-CM

## 2024-01-17 DIAGNOSIS — R911 Solitary pulmonary nodule: Secondary | ICD-10-CM

## 2024-01-17 DIAGNOSIS — I8500 Esophageal varices without bleeding: Secondary | ICD-10-CM

## 2024-01-17 DIAGNOSIS — J392 Other diseases of pharynx: Secondary | ICD-10-CM

## 2024-01-17 DIAGNOSIS — K2980 Duodenitis without bleeding: Secondary | ICD-10-CM | POA: Diagnosis not present

## 2024-01-17 DIAGNOSIS — D509 Iron deficiency anemia, unspecified: Secondary | ICD-10-CM

## 2024-01-17 DIAGNOSIS — K222 Esophageal obstruction: Secondary | ICD-10-CM

## 2024-01-17 DIAGNOSIS — K3189 Other diseases of stomach and duodenum: Secondary | ICD-10-CM | POA: Diagnosis not present

## 2024-01-17 DIAGNOSIS — K449 Diaphragmatic hernia without obstruction or gangrene: Secondary | ICD-10-CM

## 2024-01-17 DIAGNOSIS — N179 Acute kidney failure, unspecified: Secondary | ICD-10-CM | POA: Diagnosis not present

## 2024-01-17 HISTORY — PX: ESOPHAGOGASTRODUODENOSCOPY: SHX5428

## 2024-01-17 LAB — CBC WITH DIFFERENTIAL/PLATELET
Abs Immature Granulocytes: 0.06 10*3/uL (ref 0.00–0.07)
Basophils Absolute: 0.1 10*3/uL (ref 0.0–0.1)
Basophils Relative: 0 %
Eosinophils Absolute: 0.1 10*3/uL (ref 0.0–0.5)
Eosinophils Relative: 1 %
HCT: 26.7 % — ABNORMAL LOW (ref 39.0–52.0)
Hemoglobin: 8.2 g/dL — ABNORMAL LOW (ref 13.0–17.0)
Immature Granulocytes: 0 %
Lymphocytes Relative: 16 %
Lymphs Abs: 2.3 10*3/uL (ref 0.7–4.0)
MCH: 26.3 pg (ref 26.0–34.0)
MCHC: 30.7 g/dL (ref 30.0–36.0)
MCV: 85.6 fL (ref 80.0–100.0)
Monocytes Absolute: 0.9 10*3/uL (ref 0.1–1.0)
Monocytes Relative: 7 %
Neutro Abs: 10.7 10*3/uL — ABNORMAL HIGH (ref 1.7–7.7)
Neutrophils Relative %: 76 %
Platelets: 403 10*3/uL — ABNORMAL HIGH (ref 150–400)
RBC: 3.12 MIL/uL — ABNORMAL LOW (ref 4.22–5.81)
RDW: 17.3 % — ABNORMAL HIGH (ref 11.5–15.5)
WBC: 14.1 10*3/uL — ABNORMAL HIGH (ref 4.0–10.5)
nRBC: 0 % (ref 0.0–0.2)

## 2024-01-17 LAB — BASIC METABOLIC PANEL WITH GFR
Anion gap: 11 (ref 5–15)
BUN: 40 mg/dL — ABNORMAL HIGH (ref 8–23)
CO2: 15 mmol/L — ABNORMAL LOW (ref 22–32)
Calcium: 8.7 mg/dL — ABNORMAL LOW (ref 8.9–10.3)
Chloride: 108 mmol/L (ref 98–111)
Creatinine, Ser: 2.5 mg/dL — ABNORMAL HIGH (ref 0.61–1.24)
GFR, Estimated: 25 mL/min — ABNORMAL LOW (ref >60)
Glucose, Bld: 147 mg/dL — ABNORMAL HIGH (ref 70–99)
Potassium: 3.6 mmol/L (ref 3.5–5.1)
Sodium: 134 mmol/L — ABNORMAL LOW (ref 135–145)

## 2024-01-17 LAB — URINE CULTURE: Culture: >=100000 — AB

## 2024-01-17 LAB — AFP TUMOR MARKER: AFP, Serum, Tumor Marker: 4.8 ng/mL (ref 0.0–8.4)

## 2024-01-17 LAB — GLUCOSE, CAPILLARY
Glucose-Capillary: 165 mg/dL — ABNORMAL HIGH (ref 70–99)
Glucose-Capillary: 118 mg/dL — ABNORMAL HIGH (ref 70–99)
Glucose-Capillary: 177 mg/dL — ABNORMAL HIGH (ref 70–99)
Glucose-Capillary: 125 mg/dL — ABNORMAL HIGH (ref 70–99)

## 2024-01-17 LAB — MAGNESIUM: Magnesium: 1.8 mg/dL (ref 1.7–2.4)

## 2024-01-17 SURGERY — EGD (ESOPHAGOGASTRODUODENOSCOPY)
Anesthesia: Monitor Anesthesia Care

## 2024-01-17 MED ORDER — PROPOFOL 1000 MG/100ML IV EMUL
INTRAVENOUS | Status: AC
  Filled 2024-01-17: qty 100

## 2024-01-17 MED ORDER — METRONIDAZOLE 500 MG/100ML IV SOLN
500.0000 mg | Freq: Two times a day (BID) | INTRAVENOUS | Status: DC
  Administered 2024-01-17 – 2024-01-19 (×6): 500 mg via INTRAVENOUS
  Filled 2024-01-17 (×6): qty 100

## 2024-01-17 MED ORDER — PROPOFOL 500 MG/50ML IV EMUL
INTRAVENOUS | Status: DC | PRN
  Administered 2024-01-17: 125 ug/kg/min via INTRAVENOUS

## 2024-01-17 MED ORDER — LACTATED RINGERS IV SOLN: INTRAVENOUS | Status: AC

## 2024-01-17 MED ORDER — PROPOFOL 10 MG/ML IV BOLUS
INTRAVENOUS | Status: DC | PRN
  Administered 2024-01-17 (×2): 20 mg via INTRAVENOUS

## 2024-01-17 MED ORDER — LIDOCAINE 2% (20 MG/ML) 5 ML SYRINGE
INTRAMUSCULAR | Status: DC | PRN
Start: 2024-01-17 — End: 2024-01-17
  Administered 2024-01-17: 100 mg via INTRAVENOUS

## 2024-01-17 MED ORDER — SODIUM CHLORIDE 0.9 % IV SOLN: INTRAVENOUS | Status: DC | PRN

## 2024-01-17 NOTE — Anesthesia Preprocedure Evaluation (Addendum)
 Anesthesia Evaluation  Patient identified by MRN, date of birth, ID band Patient awake    Reviewed: Allergy & Precautions, NPO status , Patient's Chart, lab work & pertinent test results  Airway Mallampati: IV  TM Distance: >3 FB Neck ROM: Full    Dental  (+) Teeth Intact, Dental Advisory Given   Pulmonary former smoker   Pulmonary exam normal breath sounds clear to auscultation       Cardiovascular hypertension (145/77 preop), Normal cardiovascular exam+ Valvular Problems/Murmurs (s/p AVR, residual mild AS)  Rhythm:Regular Rate:Normal  Echo 2024 1. Left ventricular ejection fraction, by estimation, is 55 to 60%. The  left ventricle has normal function. The left ventricle has no regional  wall motion abnormalities. There is mild concentric left ventricular  hypertrophy.   2. Right ventricular systolic function is normal. The right ventricular  size is normal. There is normal pulmonary artery systolic pressure.   3. The mitral valve is degenerative. No evidence of mitral valve  regurgitation. No evidence of mitral stenosis. Severe mitral annular  calcification.   4. The aortic valve has been replaced with a 29 mm Sapien valve. Aortic  valve regurgitation is not visualized (with no PVL). Procedure Date:  01/26/2022. Effective orifice area, by VTI measures 1.86 cm. Aortic valve  mean gradient measures 12.0 mmHg.   5. Aortic dilatation noted. There is mild dilatation of the aortic root,  measuring 40 mm.   6. The inferior vena cava is normal in size with greater than 50%  respiratory variability, suggesting right atrial pressure of 3 mmHg.      Neuro/Psych  PSYCHIATRIC DISORDERS  Depression    TIACVA    GI/Hepatic Neg liver ROS, hiatal hernia,GERD  Controlled and Medicated,,concern for GI bleed.  He has a known history of esophageal varices,  Schatzki ring, and gastric polyps on last EGD in Nov 2021.   2 units PRBC on  admission with hemoglobin improved to 8.9 g/dL this morning   Endo/Other  diabetes, Well Controlled, Type 2, Insulin Dependent    Renal/GU Renal Insufficiency and CRFRenal diseaseCr 2.5  negative genitourinary   Musculoskeletal  (+) Arthritis , Osteoarthritis,    Abdominal   Peds  Hematology  (+) Blood dyscrasia, anemia Hb 8.2, plt 403   Anesthesia Other Findings   Reproductive/Obstetrics negative OB ROS                             Anesthesia Physical Anesthesia Plan  ASA: 3  Anesthesia Plan: MAC   Post-op Pain Management:    Induction:   PONV Risk Score and Plan: 2 and Propofol infusion and TIVA  Airway Management Planned: Natural Airway and Simple Face Mask  Additional Equipment: None  Intra-op Plan:   Post-operative Plan:   Informed Consent: I have reviewed the patients History and Physical, chart, labs and discussed the procedure including the risks, benefits and alternatives for the proposed anesthesia with the patient or authorized representative who has indicated his/her understanding and acceptance.       Plan Discussed with: CRNA  Anesthesia Plan Comments:         Anesthesia Quick Evaluation

## 2024-01-17 NOTE — Op Note (Signed)
 [facility] Patient Name: Tommy Green Procedure Date: 01/17/2024 MRN: [id number] Attending MD: Corliss Parish , MD, [id number] Date of Birth: [birth year] CSN: [id number] Age: 78 Admit Type: Inpatient Procedure:                Upper GI endoscopy Indications:              Iron deficiency anemia Providers:                Corliss Parish, MD, Suzy Bouchard, RN, Salley Scarlet, Technician, Leroy Libman, CRNA Referring MD:             Wilhemina Bonito. Marina Goodell, MD Medicines:                Monitored Anesthesia Care Complications:            No immediate complications. Estimated Blood Loss:     Estimated blood loss was minimal. Procedure:                Pre-Anesthesia Assessment:                           - Prior to the procedure, a History and Physical                            was performed, and patient medications and                            allergies were reviewed. The patient's tolerance of                            previous anesthesia was also reviewed. The risks                            and benefits of the procedure and the sedation                            options and risks were discussed with the patient.                            All questions were answered, and informed consent                            was obtained. Prior Anticoagulants: The patient has                            taken no anticoagulant or antiplatelet agents                            except for aspirin. ASA Grade Assessment: III - A                            patient with severe systemic disease. After  reviewing the risks and benefits, the patient was                            deemed in satisfactory condition to undergo the                            procedure.                           After obtaining informed consent, the endoscope was                            passed under direct vision. Throughout the                             procedure, the patient's blood pressure, pulse, and                            oxygen saturations were monitored continuously. The                            [id number]-H190 ([id number]) Olympus endoscope was introduced                            through the mouth, and advanced to the second part                            of duodenum. The upper GI endoscopy was                            accomplished without difficulty. The patient                            tolerated the procedure. Scope In: Scope Out: Findings:      The posterior oropharynx is abnormal with congested mucosa and hematin       being noted?"this needs further clarification and evaluation.      Grade I varices were found in the proximal esophagus and in the mid       esophagus.      No gross lesions were noted in the distal esophagus. No varices noted.      A low-grade of narrowing Schatzki ring was found at the gastroesophageal       junction (40 cm).      A 5 cm hiatal hernia was present.      Patchy mildly erythematous mucosa without bleeding was found in the       entire examined stomach. Biopsies were taken with a cold forceps for       histology and Helicobacter pylori testing.      There is no endoscopic evidence of varices or portal hypertension       gastropathy in the entire examined stomach.      No gross lesions were noted in the duodenal bulb, in the first portion       of the duodenum and in the second portion of the duodenum. Biopsies for       histology were taken with a cold  forceps for evaluation of celiac       disease. Impression:               - The posterior oropharynx is abnormal with                            congested mucosa and hematin being noted-this needs                            further clarification and evaluation.                           - Grade I esophageal varices noted proximally and                            in the middle of the esophagus.                           - No gross lesions in  the distal esophagus. And no                            evidence of varices noted distally.                           - Low-grade of narrowing Schatzki ring.                           - 5 cm hiatal hernia.                           - Erythematous mucosa in the stomach. Biopsied.                           - No evidence of portal gastropathy or varices of                            the stomach.                           - No gross lesions in the duodenal bulb, in the                            first portion of the duodenum and in the second                            portion of the duodenum. Biopsied. Moderate Sedation:      Not Applicable - Patient had care per Anesthesia. Recommendation:           - The patient will be observed post-procedure,                            until all discharge criteria are met.                           - Return patient to hospital ward for ongoing care.                           -  Recommend ENT evaluation for evaluation of the                            posterior oropharynx and area of significant                            congestion swelling with hematin being noted in                            that area.                           - Observe patient's clinical course.                           - Further workup from on inpatient GI standpoint,                            has been for recommendation of IR evaluation for                            hepatic lesion aspiration/sampling. This has been                            discussed with the inpatient medicine service with                            plan for evaluation.                           - Remain n.p.o. today in case procedure by                            interventional radiology is pursued. Otherwise no                            contraindication to initiating a normal diet.                           - At this point, after discussion with the rest of                            the inpatient GI team,  there is no plan for further                            endoscopic evaluation pending the workup of the                            liver lesion. Will allow inpatient GI team to                            discuss if this changes over the course of the week.                           -  Findings of proximal upstream varicose veins, can                            at times be seen with significant congestive venous                            issues, consider evaluation of the vasculature and                            rule out SVC syndrome or obstructive venous outflow                            per Medicine service.                           - The findings and recommendations were discussed                            with the patient.                           - The findings and recommendations were discussed                            with the referring physician. Procedure Code(s):        --- Professional ---                           [phone], Esophagogastroduodenoscopy, flexible,                            transoral; with biopsy, single or multiple Diagnosis Code(s):        --- Professional ---                           J39.2, Other diseases of pharynx                           I85.00, Esophageal varices without bleeding                           K22.2, Esophageal obstruction                           K44.9, Diaphragmatic hernia without obstruction or                            gangrene                           K31.89, Other diseases of stomach and duodenum                           D50.9, Iron deficiency anemia, unspecified CPT copyright 2022 [employer]. All rights reserved. The codes documented in this report are preliminary and upon coder review may  be revised to meet current compliance requirements. Vicente Serene  Mansouraty, MD 01/17/2024 12:10:50 PM Number of Addenda: 0

## 2024-01-17 NOTE — Telephone Encounter (Signed)
 Copied from CRM [id number]. Topic: General - Other >> Jan 16, 2024  5:11 PM Eunice Blase wrote: Reason for CRM: Pt's wife returning call from clinic, please call pt at [phone]

## 2024-01-17 NOTE — Progress Notes (Signed)
 Progress Note    Tommy Green   ZOX:[id number]  DOB: [birth year]  DOA: 01/15/2024     2 PCP: Tommy Dory, DO  Initial CC: weakness, falls, dark stools  Hospital Course: Tommy Green is a 78 yo male with PMH malignant melanoma, DMII, RA, GERD, hx CVA, HLD who presented with weakness, recurrent falls, and some persistent dark stools.  He had fallen about 1 week prior in the bathroom and struck his right face resulting in a bruised eye as well.  On workup he was noted to have Hgb 6.5 g/dL and underwent 2 units PRBC on admission. Multiple imaging studies were obtained due to his falls and no traumatic injuries were noted. He was however noted to have a right hepatic lobe lesion measuring 4.2 x 4.8 cm along with a left lower lobe nodule measuring 6 x 7 mm. GI was also consulted due to concern for GI bleed.  He has a known history of esophageal varices,  Schatzki ring, and gastric polyps on last EGD in Nov 2021.   He was admitted for ongoing GI evaluation and further workup regarding masses noted on scans.  Interval History:  Seen this morning prior to going to EGD.  He was feeling a little worse today complaining of generalized aches and malaise.  Wife was present bedside.  Reviewed findings of MRI with them and potential need for biopsy still.  Assessment and Plan: * GI bleed - Possible upper GIB versus anemia from underlying suspected malignancy -Hemoglobin 6.5 g/dL on admission.  He does note dark stools been going on for a while at home; there has been associated weight loss as well - Received 2 units PRBC on admission with hemoglobin improved to 8.9 g/dL on 8/11 and still stable today, 4/1  - GI consulted on admission also - EGD on 4/1: Grade 1 esophageal varices, Schatzki ring at GE junction.  Patchy erythematous mucosa in stomach with no bleeding  Liver mass - Patient and wife aware of CT findings notably right hepatic lobe mass measuring 4.2 cm x 4.8 cm and left  lower lobe lung nodule measuring 6 x 7 mm; CT also mentions suspected nodule measuring 1.4 x 1.7 cm in the right upper quadrant abutting inferior liver surface - Liver MRI on 3/31 noting this may be hepatic abscesses as they are rim enhancing lesions - discussed with GI and will try to see if IR can obtain biopsy of lesion to help further evaluate; in meantime will add flagyl to rocephin (WBC further increased on 4/1)  Acute renal failure superimposed on stage 3b chronic kidney disease (HCC) - patient has history of CKD3b. Baseline creat ~ 1.8 - 2.2, eGFR~ 32 - patient presents with increase in creat >0.3 mg/dL above baseline or creat increase >1.5x baseline presumed to have occurred within past 7 days PTA - creat 2.49 on admission - suspect volume depletion but may also progressive CKD given borderline GFR recently and at risk for progression - continue on IVF for now and trend BMP   Lung nodule - per CT on 3/30: "subpleural solid noncalcified 6 x 7 mm nodule in the left lung lower lobe, which is grossly unchanged since the prior study but new since the prior study from 12/18/2021" - await liver workup, but depending on outcome, might need further workup on the lung nodule   Rheumatoid arteritis (HCC) - On Enbrel at home -Intermittent pain medications, database reviewed  BPH associated with nocturia - Continue finasteride  S/P TAVR (  transcatheter aortic valve replacement) - Prior history of severe AS  Type 2 diabetes mellitus with diabetic neuropathy, unspecified (HCC) - Continue SSI and CBG monitoring for now -Last A1c 5.8% on 01/15/2024 -Hold basal insulin for now  Hyperlipidemia associated with type 2 diabetes mellitus (HCC) - On Zetia, fenofibrate at home  Essential hypertension - On lisinopril at home; hold for now   Old records reviewed in assessment of this patient  Antimicrobials:   DVT prophylaxis:  SCDs Start: 01/15/24 1558   Code Status:   Code Status: Full  Code  Mobility Assessment (Last 72 Hours)     Mobility Assessment     Row Name 01/17/24 1248 01/17/24 0900 01/16/24 2000 01/16/24 1930 01/16/24 0849   Does patient have an order for bedrest or is patient medically unstable No - Continue assessment No - Continue assessment No - Continue assessment No - Continue assessment No - Continue assessment   What is the highest level of mobility based on the progressive mobility assessment? Level 4 (Walks with assist in room) - Balance while marching in place and cannot step forward and back - Complete Level 4 (Walks with assist in room) - Balance while marching in place and cannot step forward and back - Complete Level 4 (Walks with assist in room) - Balance while marching in place and cannot step forward and back - Complete Level 4 (Walks with assist in room) - Balance while marching in place and cannot step forward and back - Complete Level 4 (Walks with assist in room) - Balance while marching in place and cannot step forward and back - Complete    Row Name 01/15/24 2000 01/15/24 1526         Does patient have an order for bedrest or is patient medically unstable No - Continue assessment No - Continue assessment      What is the highest level of mobility based on the progressive mobility assessment? Level 4 (Walks with assist in room) - Balance while marching in place and cannot step forward and back - Complete Level 4 (Walks with assist in room) - Balance while marching in place and cannot step forward and back - Complete               Barriers to discharge: None Disposition Plan: TBD HH orders placed:  Status is: Inpatient  Objective: Blood pressure (!) 141/68, pulse 70, temperature 97.6 F (36.4 C), temperature source Oral, resp. rate 14, height 5\' 11"  (1.803 m), weight 97 kg, SpO2 96%.  Examination:  Physical Exam Constitutional:      General: He is not in acute distress.    Appearance: Normal appearance.  HENT:     Head:  Normocephalic.     Comments: Bruising noted around right eye    Mouth/Throat:     Mouth: Mucous membranes are moist.  Eyes:     Extraocular Movements: Extraocular movements intact.  Cardiovascular:     Rate and Rhythm: Normal rate and regular rhythm.  Pulmonary:     Effort: Pulmonary effort is normal. No respiratory distress.     Breath sounds: Normal breath sounds. No wheezing.  Abdominal:     General: Bowel sounds are normal. There is no distension.     Palpations: Abdomen is soft.     Tenderness: There is no abdominal tenderness.  Musculoskeletal:        General: Normal range of motion.     Cervical back: Normal range of motion and neck supple.  Skin:  General: Skin is warm and dry.  Neurological:     General: No focal deficit present.     Mental Status: He is alert.  Psychiatric:        Mood and Affect: Mood normal.        Behavior: Behavior normal.      Consultants:  GI  Procedures:  4/1: EGD  Data Reviewed: Results for orders placed or performed during the hospital encounter of 01/15/24 (from the past 24 hours)  Glucose, capillary     Status: Abnormal   Collection Time: 01/16/24  4:53 PM  Result Value Ref Range   Glucose-Capillary 141 (H) 70 - 99 mg/dL  Glucose, capillary     Status: Abnormal   Collection Time: 01/16/24  9:31 PM  Result Value Ref Range   Glucose-Capillary 186 (H) 70 - 99 mg/dL  Basic metabolic panel with GFR     Status: Abnormal   Collection Time: 01/17/24  4:50 AM  Result Value Ref Range   Sodium 134 (L) 135 - 145 mmol/L   Potassium 3.6 3.5 - 5.1 mmol/L   Chloride 108 98 - 111 mmol/L   CO2 15 (L) 22 - 32 mmol/L   Glucose, Bld 147 (H) 70 - 99 mg/dL   BUN 40 (H) 8 - 23 mg/dL   Creatinine, Ser 1.61 (H) 0.61 - 1.24 mg/dL   Calcium 8.7 (L) 8.9 - 10.3 mg/dL   GFR, Estimated 25 (L) >60 mL/min   Anion gap 11 5 - 15  CBC with Differential/Platelet     Status: Abnormal   Collection Time: 01/17/24  4:50 AM  Result Value Ref Range   WBC 14.1  (H) 4.0 - 10.5 K/uL   RBC 3.12 (L) 4.22 - 5.81 MIL/uL   Hemoglobin 8.2 (L) 13.0 - 17.0 g/dL   HCT 09.6 (L) 04.5 - 40.9 %   MCV 85.6 80.0 - 100.0 fL   MCH 26.3 26.0 - 34.0 pg   MCHC 30.7 30.0 - 36.0 g/dL   RDW 81.1 (H) 91.4 - 78.2 %   Platelets 403 (H) 150 - 400 K/uL   nRBC 0.0 0.0 - 0.2 %   Neutrophils Relative % 76 %   Neutro Abs 10.7 (H) 1.7 - 7.7 K/uL   Lymphocytes Relative 16 %   Lymphs Abs 2.3 0.7 - 4.0 K/uL   Monocytes Relative 7 %   Monocytes Absolute 0.9 0.1 - 1.0 K/uL   Eosinophils Relative 1 %   Eosinophils Absolute 0.1 0.0 - 0.5 K/uL   Basophils Relative 0 %   Basophils Absolute 0.1 0.0 - 0.1 K/uL   Immature Granulocytes 0 %   Abs Immature Granulocytes 0.06 0.00 - 0.07 K/uL  Magnesium     Status: None   Collection Time: 01/17/24  4:50 AM  Result Value Ref Range   Magnesium 1.8 1.7 - 2.4 mg/dL  Glucose, capillary     Status: Abnormal   Collection Time: 01/17/24  7:40 AM  Result Value Ref Range   Glucose-Capillary 165 (H) 70 - 99 mg/dL  Glucose, capillary     Status: Abnormal   Collection Time: 01/17/24 11:25 AM  Result Value Ref Range   Glucose-Capillary 118 (H) 70 - 99 mg/dL    I have reviewed pertinent nursing notes, vitals, labs, and images as necessary. I have ordered labwork to follow up on as indicated.  I have reviewed the last notes from staff over past 24 hours. I have discussed patient's care plan and test results with nursing  staff, CM/SW, and other staff as appropriate.  Time spent: Greater than 50% of the 55 minute visit was spent in counseling/coordination of care for the patient as laid out in the A&P.   LOS: 2 days   Lewie Chamber, MD [facility] 01/17/2024, 2:06 PM

## 2024-01-17 NOTE — Consult Note (Signed)
 Chief Complaint: Patient was seen in consultation today for hepatic abscess  at the request of Lewie Chamber, MD.  Referring Physician(s): Burr Medico, MD  Supervising Physician: Roanna Banning  Patient Status: [facility] - In-pt  Full Code  History of Present Illness: Tommy Green is a 78 y.o. male with history of anemia, HTN, GERD, left foot drop, aortic stenosis-s/p TAVR 2023, type 2 diabetes mellitus, malignant melanoma, RA, and CVA. Patient was admitted to [facility] long hospital 3/30 due to blood loss anemia and multiple falls at home. Upon admission, workup revealed UTI and anemia. CT chest/abdomen/pelvis performed 01/15/24, which reported is listed below.   CT chest/abdomen/pelvis 01/15/24.   A subsequent MRI was ordered and impression is listed below.    IR consulted to assist with a liver aspiration. Patient's wife is present at the bedside for history. Patient's wife reports a recent history of loss of appetite and weakness. She reports that they presented to the ER due to frequent falls. Patient  has not experience nausea, vomiting, abdominal pain, diarrhea, chest pain, and/or shortness of breath.    Past Medical History:  Diagnosis Date   Anemia    Essential hypertension 07/18/2017   Foot drop, left foot    due to back surgery in 01/2021   GERD (gastroesophageal reflux disease)    History of colonic polyps    History of hiatal hernia    many years ago   History of rheumatic fever    History of stroke 07/18/2017   Hypertriglyceridemia 07/18/2017   S/P TAVR (transcatheter aortic valve replacement) 01/26/2022   Edwards S3UR 29mm via TF approach with Dr. Lynnette Caffey and Dr. Laneta Simmers   Severe aortic stenosis    Type 2 diabetes mellitus with diabetic neuropathy, unspecified (HCC) 07/18/2017    Past Surgical History:  Procedure Laterality Date   COLONOSCOPY     around 2018 [facility]   COLONOSCOPY  09/02/2020   CRANIOTOMY Left 10/25/2019   Procedure:  CRANIOTOMY HEMATOMA EVACUATION SUBDURAL;  Surgeon: Bedelia Person, MD;  Location: [facility] OR;  Service: Neurosurgery;  Laterality: Left;   ESOPHAGOGASTRODUODENOSCOPY     around 2018 with [facility]   FOOT CAPSULOTOMY Left 07/25/2008   Mid Foot #2 MPJ   Hammertoe Repair Left 07/25/2008   #2 toe   HAND SURGERY     nodule removed from left thumb and right index finger, Dr. Judithann Sheen   INTRAOPERATIVE TRANSTHORACIC ECHOCARDIOGRAM N/A 01/26/2022   Procedure: INTRAOPERATIVE TRANSTHORACIC ECHOCARDIOGRAM;  Surgeon: Orbie Pyo, MD;  Location: [facility] OR;  Service: Open Heart Surgery;  Laterality: N/A;   MOHS SURGERY  01/25/2023   right arm   RIGHT/LEFT HEART CATH AND CORONARY ANGIOGRAPHY N/A 12/11/2021   Procedure: RIGHT/LEFT HEART CATH AND CORONARY ANGIOGRAPHY;  Surgeon: Corky Crafts, MD;  Location: [facility] INVASIVE CV LAB;  Service: Cardiovascular;  Laterality: N/A;   SPINAL FUSION  01/2021   TARSAL TUNNEL RELEASE Left 07/25/2008   TONSILLECTOMY     removed as a child   TRANSCATHETER AORTIC VALVE REPLACEMENT, TRANSFEMORAL N/A 01/26/2022   Procedure: Transcatheter Aortic Valve Replacement , Transfemoral;  Surgeon: Orbie Pyo, MD;  Location: [facility] OR;  Service: Open Heart Surgery;  Laterality: N/A;  Percutaneous   UPPER GASTROINTESTINAL ENDOSCOPY  09/02/2020    Allergies: Canagliflozin, Aspirin, Atorvastatin, and Statins  Medications: Prior to Admission medications   Medication Sig Start Date End Date Taking? Authorizing Provider  acetaminophen (TYLENOL) 325 MG tablet Take 650 mg by mouth every 6 (six)  hours as needed for mild pain or headache.   Yes [provider]  aspirin EC 81 MG tablet Take 1 tablet (81 mg total) by mouth daily. Swallow whole. 01/26/23  Yes Georgie Chard D, NP  B Complex Vitamins (B COMPLEX PO) Take 1 tablet by mouth daily.   Yes [provider]  Cholecalciferol (VITAMIN D3 SUPER STRENGTH) 50 MCG (2000 UT) CAPS Take 2,000 Units by mouth daily.    Yes [provider]  escitalopram (LEXAPRO) 20 MG tablet Take 1 tablet (20 mg total) by mouth daily. 08/29/23  Yes [name], DO  etanercept (ENBREL MINI) 50 MG/ML injection Inject 1 mL (50 mg total) into the skin once a week. 11/03/23  Yes Gearldine Bienenstock, PA-C  ezetimibe (ZETIA) 10 MG tablet Take 1 tablet (10 mg total) by mouth daily. 11/08/23  Yes [name], DO  fenofibrate micronized (LOFIBRA) 200 MG capsule TAKE 1 CAPSULE DAILY BEFOREBREAKFAST 06/17/23  Yes [name], DO  finasteride (PROSCAR) 5 MG tablet Take 1 tablet (5 mg total) by mouth daily. 02/07/23  Yes Joline Maxcy, MD  HYDROcodone-acetaminophen (NORCO) 7.5-325 MG tablet Take 1 tablet by mouth every 6 (six) hours as needed for moderate pain (pain score 4-6). 12/15/23  Yes Wendling, Jilda Roche, DO  insulin aspart (NOVOLOG FLEXPEN) 100 UNIT/ML FlexPen Inject 0-20 Units into the skin See admin instructions. Inject 0-10 units into the skin before meals, PER SLIDING SCALE: BGL 0-200 = give nothing, 201-250 = 2 units, 251-300 = 4 units, 301-350 = 6 units, 351-400 = 8 units, >400 = 10 units, push fluids, and repeat BGL check in 2 hours. If BGL remains >400 after 2 hours, CALL MD.   Yes [provider]  insulin degludec (TRESIBA FLEXTOUCH) 100 UNIT/ML FlexTouch Pen Inject 20 Units into the skin daily.   Yes [provider]  leflunomide (ARAVA) 10 MG tablet TAKE 1 TABLET DAILY 09/28/23  Yes Deveshwar, Janalyn Rouse, MD  lisinopril (ZESTRIL) 40 MG tablet Take 1 tablet (40 mg total) by mouth in the morning. 10/05/23  Yes [name], DO  magnesium oxide (MAG-OX) 400 MG tablet Take 400 mg by mouth daily with lunch.   Yes [provider]  melatonin 5 MG TABS Take 5 mg by mouth at bedtime.   Yes [provider]  Multiple Vitamins-Minerals (MULTIVITAMIN WITH MINERALS) tablet Take 1 tablet by mouth daily with breakfast.   Yes [provider]  Omega 3  1000 MG CAPS Take 2,000 mg by mouth daily with lunch.   Yes [provider]  omeprazole (PRILOSEC) 20 MG capsule Take 1 capsule (20 mg total) by mouth daily. 06/08/23  Yes [name], DO  Potassium 99 MG TABS Take 99 mg by mouth every evening.   Yes [provider]  vitamin B-12 (CYANOCOBALAMIN) 1000 MCG tablet Take 1,000 mcg by mouth daily.   Yes [provider]  vitamin E 180 MG (400 UNITS) capsule Take 400 Units by mouth daily.   Yes [provider]  amoxicillin (AMOXIL) 500 MG tablet Take 4 tablets (2,000 mg total) by mouth as directed. 1 HOUR PRIOR TO DENTAL APPOINTMENTS Patient not taking: Reported on 01/15/2024 05/18/22   Baldo Daub, MD  cefdinir (OMNICEF) 300 MG capsule Take 1 capsule (300 mg total) by mouth 2 (two) times daily for 7 days. 01/14/24 01/21/24  [name], DO  fluticasone (FLONASE) 50 MCG/ACT nasal spray Place 2 sprays into both nostrils daily. Patient not  taking: Reported on 01/15/2024 06/16/22   [name], DO  HYDROcodone-acetaminophen [facility]) 7.5-325 MG tablet Take 1 tablet by mouth every 6 (six) hours as needed for moderate pain (pain score 4-6). Patient not taking: Reported on 01/15/2024 11/15/23   [name], DO  HYDROcodone-acetaminophen [facility]) 7.5-325 MG tablet Take 1 tablet by mouth every 6 (six) hours as needed for moderate pain (pain score 4-6). Patient not taking: Reported on 01/15/2024 01/14/24   [name], DO  [facility] VERIO test strip 1 each by Other route as needed for other (Glucose). 06/14/20   [provider]     Family History  Problem Relation Age of Onset   Hyperlipidemia Mother    Colon polyps Mother    Hyperlipidemia Father    Alcoholism Brother    Healthy Son    Healthy Son    Cancer Neg Hx    Colon cancer Neg Hx    Esophageal cancer Neg Hx    Rectal cancer Neg Hx    Stomach cancer Neg Hx     Social History   Socioeconomic History    Marital status: Married    Spouse name: Not on file   Number of children: 2   Years of education: Not on file   Highest education level: Not on file  Occupational History   Not on file  Tobacco Use   Smoking status: Former    Current packs/day: 0.00    Average packs/day: 1 pack/day for 10.0 years (10.0 ttl pk-yrs)    Types: Cigarettes    Start date: 20    Quit date: 41    Years since quitting: 52.2    Passive exposure: Past   Smokeless tobacco: Never  Vaping Use   Vaping status: Never Used  Substance and Sexual Activity   Alcohol use: Not Currently   Drug use: No   Sexual activity: Yes  Other Topics Concern   Not on file  Social History Narrative   Not on file   Social Drivers of Health   Financial Resource Strain: Low Risk  (11/04/2023)   Overall Financial Resource Strain (CARDIA)    Difficulty of Paying Living Expenses: Not hard at all  Food Insecurity: No Food Insecurity (01/16/2024)   Hunger Vital Sign    Worried About Running Out of Food in the Last Year: Never true    Ran Out of Food in the Last Year: Never true  Transportation Needs: No Transportation Needs (01/16/2024)   PRAPARE - Administrator, Civil Service (Medical): No    Lack of Transportation (Non-Medical): No  Physical Activity: Inactive (11/04/2023)   Exercise Vital Sign    Days of Exercise per Week: 0 days    Minutes of Exercise per Session: 0 min  Stress: No Stress Concern Present (11/04/2023)   [employer] of Occupational Health - Occupational Stress Questionnaire    Feeling of Stress : Not at all  Social Connections: Socially Integrated (01/15/2024)   Social Connection and Isolation Panel [NHANES]    Frequency of Communication with Friends and Family: More than three times a week    Frequency of Social Gatherings with Friends and Family: Twice a week    Attends Religious Services: More than 4 times per year    Active Member of [employer] or Organizations: Yes    Attends Museum/gallery exhibitions officer: More than 4 times per year    Marital Status: Married     Review of Systems: A 12  point ROS discussed and pertinent positives are indicated in the HPI above.  All other systems are negative.  Review of Systems  Constitutional:  Positive for appetite change.       Positive for weakness and frequent falls   Respiratory:  Negative for shortness of breath.   Cardiovascular:  Negative for chest pain.  Gastrointestinal:  Negative for abdominal pain, diarrhea, nausea and vomiting.    Vital Signs: BP (!) 141/68 (BP Location: Left Arm) Comment: notify to the nurse.  Pulse 70   Temp 97.6 F (36.4 C) (Oral)   Resp 14   Ht 5\' 11"  (1.803 m)   Wt 213 lb 12.8 oz (97 kg)   SpO2 96%   BMI 29.82 kg/m     Physical Exam Constitutional:      Appearance: Normal appearance.  HENT:     Head: Normocephalic and atraumatic.     Mouth/Throat:     Mouth: Mucous membranes are moist.     Pharynx: Oropharynx is clear.  Cardiovascular:     Rate and Rhythm: Normal rate and regular rhythm.     Heart sounds: Murmur heard.  Pulmonary:     Effort: Pulmonary effort is normal.  Abdominal:     General: Abdomen is flat.  Musculoskeletal:     Cervical back: Normal range of motion.  Skin:    General: Skin is warm.  Neurological:     General: No focal deficit present.     Mental Status: He is alert.     Comments: Patient is somnolent.      Imaging: MR LIVER W WO CONTRAST Result Date: 01/16/2024 CLINICAL DATA:  Liver lesion, malignancy suspected EXAM: MRI ABDOMEN WITHOUT AND WITH CONTRAST TECHNIQUE: Multiplanar multisequence MR imaging of the abdomen was performed both before and after the administration of intravenous contrast. CONTRAST:  10mL GADAVIST GADOBUTROL 1 MMOL/ML IV SOLN COMPARISON:  CT chest abdomen pelvis, 01/15/2024 FINDINGS: Lower chest: No acute abnormality.  Small hiatal hernia. Hepatobiliary: Multilobulated, internally septated rim enhancing lesion within the  inferior right lobe of the liver, hepatic segment V/VI measuring 4.5 x 3.7 cm (series 11, image 46). Second small component which is inseparable from this larger lesion and the liver capsule situated just inferiorly measuring 2.5 x 2.4 cm (series 11, image 63). These lesions demonstrate internal diffusion restriction. Additional small enhancing lesion in the anterior right lobe of the liver, hepatic segment VIII measuring 1.2 x 1.0 cm without internal hypoenhancement or fluid character (series 11, image 24). Status post cholecystectomy. No biliary ductal dilatation. Pancreas: Unremarkable. No pancreatic ductal dilatation or surrounding inflammatory changes. Spleen: Normal in size without significant abnormality. Adrenals/Urinary Tract: Adrenal glands are unremarkable. Layering hemorrhagic or proteinaceous cyst arising from the posterior superior pole of the left kidney benign, requiring no further follow-up or characterization (series 11, image 50). Kidneys are otherwise normal, without obvious renal calculi, solid lesion, or hydronephrosis. Stomach/Bowel: Stomach is within normal limits. No evidence of bowel wall thickening, distention, or inflammatory changes. Vascular/Lymphatic: No significant vascular findings are present. No enlarged abdominal lymph nodes. Other: No abdominal wall hernia or abnormality. No ascites. Musculoskeletal: No acute or significant osseous findings. IMPRESSION: 1. Multilobulated, internally septated rim enhancing lesion within the inferior right lobe of the liver, hepatic segment V/VI measuring 4.5 x 3.7 cm. Second small component which is inseparable from this larger lesion and the liver capsule situated just inferiorly measuring 2.5 x 2.4 cm. Imaging characteristics are strongly suggestive of hepatic abscess, however a cystic metastasis could also  have this appearance depending upon clinical presentation. 2. Additional small enhancing lesion in the anterior right lobe of the liver,  hepatic segment VIII measuring 1.2 x 1.0 cm without internal hypoenhancement or fluid character. This is less specific, and could reflect a small phlegmon or additional metastasis. 3. No lymphadenopathy or other evidence of metastatic disease in the abdomen. Electronically Signed   By: Jearld Lesch M.D.   On: 01/16/2024 22:38   CT CHEST ABDOMEN PELVIS WO CONTRAST Result Date: 01/15/2024 CLINICAL DATA:  Abdominal trauma, blunt. Multiple falls in last 2 weeks. Worsening urinary frequency. Dysuria. * Tracking Code: BO * EXAM: CT CHEST, ABDOMEN AND PELVIS WITHOUT CONTRAST TECHNIQUE: Multidetector CT imaging of the chest, abdomen and pelvis was performed following the standard protocol without IV contrast. RADIATION DOSE REDUCTION: This exam was performed according to the departmental dose-optimization program which includes automated exposure control, adjustment of the mA and/or kV according to patient size and/or use of iterative reconstruction technique. COMPARISON:  CT scan chest, abdomen and pelvis from 12/18/2021 and CT scan abdomen and pelvis from 08/21/2023 FINDINGS: CT CHEST FINDINGS Cardiovascular: Normal cardiac size. No pericardial effusion. No aortic aneurysm. There are coronary artery calcifications, in keeping with coronary artery disease. Prosthetic aortic valve noted. There is dense mitral annulus calcification. Mediastinum/Nodes: Visualized thyroid gland appears grossly unremarkable. No solid / cystic mediastinal masses. The esophagus is nondistended precluding optimal assessment. No mediastinal or axillary lymphadenopathy by size criteria. Evaluation of bilateral hila is limited due to lack on intravenous contrast: however, no large hilar lymphadenopathy identified. Lungs/Pleura: The central tracheo-bronchial tree is patent. There are patchy areas of linear, plate-like atelectasis and/or scarring throughout bilateral lungs. No mass or consolidation. No pleural effusion or pneumothorax. There is a  subpleural solid noncalcified 6 x 7 mm nodule in the left lung lower lobe (series 312, image 58), which is grossly unchanged since the prior study from 08/21/2023 but new since the prior study from 12/18/2021. There is a stable 2 mm calcified granuloma in the left lung lower lobe. Musculoskeletal: The visualized soft tissues of the chest wall are grossly unremarkable. No suspicious osseous lesions. There are mild multilevel degenerative changes in the visualized spine. CT ABDOMEN PELVIS FINDINGS Hepatobiliary: The liver is normal in size. Non-cirrhotic configuration. There is a new ill-defined heterogeneous hypoattenuating approximately 4.2 x 4.8 cm mass in the right hepatic lobe, segment 5/6, which is new since the prior study and highly concerning for neoplastic process. There is an additional smaller approximately 1 cm sized hypoattenuating structure in the right hepatic lobe, segment 8, which is also concerning for neoplastic process. Further evaluation with multiphasic contrast-enhanced MRI or CT scan abdomen as per liver mass protocol is recommended. No intrahepatic or extrahepatic bile duct dilation. Gallbladder is surgically absent. Pancreas: Unremarkable. No pancreatic ductal dilatation or surrounding inflammatory changes. Spleen: Within normal limits. No focal lesion. Adrenals/Urinary Tract: Adrenal glands are unremarkable. No suspicious renal mass within the limitations of this unenhanced exam. There is a nearly completely exophytic 2.1 x 2.3 cm structure arising from the left kidney upper pole, with internal CT attenuation of 29 Hounsfield units. This is incompletely characterized on this unenhanced exam but is present since the prior study and favored to represent a proteinaceous/hemorrhagic cyst. This can also be better characterized on the MRI exam. No nephroureterolithiasis or obstructive uropathy. Unremarkable urinary bladder. Stomach/Bowel: There is a small sliding hiatal hernia. No  disproportionate dilation of the small or large bowel loops. No evidence of abnormal bowel wall thickening  or inflammatory changes. The appendix is unremarkable. There are multiple diverticula mainly in the left hemi colon, without imaging signs of diverticulitis. Vascular/Lymphatic: No ascites or pneumoperitoneum. There is an irregular approximately 1.4 x 1.7 cm soft tissue attenuation nodule in the right upper quadrant abutting the right inferior liver surface, favored to represent metastatic deposit. No abdominal or pelvic lymphadenopathy, by size criteria. No aneurysmal dilation of the major abdominal arteries. There are minimal peripheral atherosclerotic vascular calcifications of the aorta and its major branches. Reproductive: Normal size prostate. Symmetric seminal vesicles. Other: There are bilateral small fat containing inguinal hernias. The soft tissues and abdominal wall are otherwise unremarkable. Musculoskeletal: There is a subcentimeter sized lytic lesion in the right iliac bone (610, image 95), which is incompletely characterized on the current exam but present since the prior study from March 2023 and favored benign. There are mild multilevel degenerative changes in the visualized spine. Lower thoracic, lumbar and upper sacral spinal fixation hardware noted including bilateral SI joint arthrodesis. IMPRESSION: 1. No acute traumatic injury to the chest, abdomen or pelvis. 2. There is a new ill-defined heterogeneous hypoattenuating approximately 4.2 x 4.8 cm mass in the right hepatic lobe, segment 5/6, which is new since the prior study and highly concerning for neoplastic process. There is an additional smaller approximately 1 cm sized hypoattenuating structure in the right hepatic lobe, segment 8, which is also concerning for neoplastic process. Further evaluation with multiphasic contrast-enhanced MRI or CT scan abdomen as per liver mass protocol is recommended. 3. There is an irregular  approximately 1.4 x 1.7 cm soft tissue attenuation nodule in the right upper quadrant abutting the right inferior liver surface, favored to represent metastatic deposit. 4. There is a subpleural solid noncalcified 6 x 7 mm nodule in the left lung lower lobe, which is grossly unchanged since the prior study but new since the prior study from 12/18/2021. This is indeterminate. 5. Multiple other nonacute observations, as described above. Aortic Atherosclerosis (ICD10-I70.0). Electronically Signed   By: Jules Schick M.D.   On: 01/15/2024 11:56   CT L-SPINE NO CHARGE Result Date: 01/15/2024 CLINICAL DATA:  Worsening urinary frequency since Wednesday. Dysuria. History of 4 falls in the last 2 weeks. EXAM: CT Lumbar Spine without contrast TECHNIQUE: Technique: Multiplanar CT images of the lumbar spine were reconstructed from contemporary CT of the Abdomen and Pelvis. RADIATION DOSE REDUCTION: This exam was performed according to the departmental dose-optimization program which includes automated exposure control, adjustment of the mA and/or kV according to patient size and/or use of iterative reconstruction technique. CONTRAST:  None COMPARISON:  Lumbar radiography 03/30/2020 FINDINGS: Segmentation: 5 lumbar type vertebrae Alignment: No acute malalignment.  Dextroscoliosis. Vertebrae: Thoracic (at least T10) to pelvic fusion solid arthrodesis is seen at L3-L5. There is vacuum phenomenon in the L5-S1 disc space with lucency around the S1 and pelvic screws. Intervertebral gas also seen at upper lumbar levels including L2-3. No acute fracture or aggressive bone lesion. Hip osteoarthritis with joint space narrowing on the left more than right. Paraspinal and other soft tissues: Extensive postoperative scarring and fatty muscular atrophy at levels of broad laminectomy in the lumbar spine. Disc levels: No high-grade bony spinal stenosis is seen after laminectomy. Degenerative foraminal impingement on the left at L2-3 which  appears advanced. Other foraminal narrowings are mild-to-moderate. IMPRESSION: 1. No acute finding. 2. 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 3. Advanced degeneration with multilevel laminectomy. Electronically Signed   By: Audry Riles.D.  On: 01/15/2024 11:44   CT Head Wo Contrast Result Date: 01/15/2024 CLINICAL DATA:  Head trauma, minor (Age >= 65y); Neck trauma (Age >= 65y); Facial trauma, blunt EXAM: CT HEAD WITHOUT CONTRAST CT MAXILLOFACIAL WITHOUT CONTRAST CT CERVICAL SPINE WITHOUT CONTRAST TECHNIQUE: Multidetector CT imaging of the head, cervical spine, and maxillofacial structures were performed using the standard protocol without intravenous contrast. Multiplanar CT image reconstructions of the cervical spine and maxillofacial structures were also generated. RADIATION DOSE REDUCTION: This exam was performed according to the departmental dose-optimization program which includes automated exposure control, adjustment of the mA and/or kV according to patient size and/or use of iterative reconstruction technique. COMPARISON:  12/09/2021 FINDINGS: CT HEAD FINDINGS Brain: No evidence of acute infarction, hemorrhage, hydrocephalus, extra-axial collection or mass lesion/mass effect. Patchy low-density changes within the periventricular and subcortical white matter most compatible with chronic microvascular ischemic change. Moderate diffuse cerebral volume loss. Vascular: No hyperdense vessel or unexpected calcification. Skull: Prior left parietal craniotomy.  No acute calvarial fracture. Other: Negative for scalp hematoma. CT MAXILLOFACIAL FINDINGS Osseous: No acute maxillofacial bone fracture. Bony orbital walls are intact. Mandible intact. Temporomandibular joints are aligned without dislocation. Orbits: Negative. No traumatic or inflammatory finding. Sinuses: Clear. Soft tissues: Negative. CT CERVICAL SPINE FINDINGS Alignment: Facet joints are aligned without  dislocation or traumatic listhesis. Dens and lateral masses are aligned. Skull base and vertebrae: No acute fracture. No primary bone lesion or focal pathologic process. Soft tissues and spinal canal: No prevertebral fluid or swelling. No visible canal hematoma. Disc levels:  Moderate multilevel cervical spondylosis. Upper chest: Included lung apices are clear. Other: Bilateral carotid atherosclerosis. IMPRESSION: 1. No acute intracranial abnormality. 2. No acute maxillofacial bone fracture. 3. No acute fracture or subluxation of the cervical spine. Electronically Signed   By: Duanne Guess D.O.   On: 01/15/2024 11:42   CT Cervical Spine Wo Contrast Result Date: 01/15/2024 CLINICAL DATA:  Head trauma, minor (Age >= 65y); Neck trauma (Age >= 65y); Facial trauma, blunt EXAM: CT HEAD WITHOUT CONTRAST CT MAXILLOFACIAL WITHOUT CONTRAST CT CERVICAL SPINE WITHOUT CONTRAST TECHNIQUE: Multidetector CT imaging of the head, cervical spine, and maxillofacial structures were performed using the standard protocol without intravenous contrast. Multiplanar CT image reconstructions of the cervical spine and maxillofacial structures were also generated. RADIATION DOSE REDUCTION: This exam was performed according to the departmental dose-optimization program which includes automated exposure control, adjustment of the mA and/or kV according to patient size and/or use of iterative reconstruction technique. COMPARISON:  12/09/2021 FINDINGS: CT HEAD FINDINGS Brain: No evidence of acute infarction, hemorrhage, hydrocephalus, extra-axial collection or mass lesion/mass effect. Patchy low-density changes within the periventricular and subcortical white matter most compatible with chronic microvascular ischemic change. Moderate diffuse cerebral volume loss. Vascular: No hyperdense vessel or unexpected calcification. Skull: Prior left parietal craniotomy.  No acute calvarial fracture. Other: Negative for scalp hematoma. CT MAXILLOFACIAL  FINDINGS Osseous: No acute maxillofacial bone fracture. Bony orbital walls are intact. Mandible intact. Temporomandibular joints are aligned without dislocation. Orbits: Negative. No traumatic or inflammatory finding. Sinuses: Clear. Soft tissues: Negative. CT CERVICAL SPINE FINDINGS Alignment: Facet joints are aligned without dislocation or traumatic listhesis. Dens and lateral masses are aligned. Skull base and vertebrae: No acute fracture. No primary bone lesion or focal pathologic process. Soft tissues and spinal canal: No prevertebral fluid or swelling. No visible canal hematoma. Disc levels:  Moderate multilevel cervical spondylosis. Upper chest: Included lung apices are clear. Other: Bilateral carotid atherosclerosis. IMPRESSION: 1. No acute intracranial abnormality. 2. No  acute maxillofacial bone fracture. 3. No acute fracture or subluxation of the cervical spine. Electronically Signed   By: Duanne Guess D.O.   On: 01/15/2024 11:42   CT Maxillofacial Wo Contrast Result Date: 01/15/2024 CLINICAL DATA:  Head trauma, minor (Age >= 65y); Neck trauma (Age >= 65y); Facial trauma, blunt EXAM: CT HEAD WITHOUT CONTRAST CT MAXILLOFACIAL WITHOUT CONTRAST CT CERVICAL SPINE WITHOUT CONTRAST TECHNIQUE: Multidetector CT imaging of the head, cervical spine, and maxillofacial structures were performed using the standard protocol without intravenous contrast. Multiplanar CT image reconstructions of the cervical spine and maxillofacial structures were also generated. RADIATION DOSE REDUCTION: This exam was performed according to the departmental dose-optimization program which includes automated exposure control, adjustment of the mA and/or kV according to patient size and/or use of iterative reconstruction technique. COMPARISON:  12/09/2021 FINDINGS: CT HEAD FINDINGS Brain: No evidence of acute infarction, hemorrhage, hydrocephalus, extra-axial collection or mass lesion/mass effect. Patchy low-density changes within  the periventricular and subcortical white matter most compatible with chronic microvascular ischemic change. Moderate diffuse cerebral volume loss. Vascular: No hyperdense vessel or unexpected calcification. Skull: Prior left parietal craniotomy.  No acute calvarial fracture. Other: Negative for scalp hematoma. CT MAXILLOFACIAL FINDINGS Osseous: No acute maxillofacial bone fracture. Bony orbital walls are intact. Mandible intact. Temporomandibular joints are aligned without dislocation. Orbits: Negative. No traumatic or inflammatory finding. Sinuses: Clear. Soft tissues: Negative. CT CERVICAL SPINE FINDINGS Alignment: Facet joints are aligned without dislocation or traumatic listhesis. Dens and lateral masses are aligned. Skull base and vertebrae: No acute fracture. No primary bone lesion or focal pathologic process. Soft tissues and spinal canal: No prevertebral fluid or swelling. No visible canal hematoma. Disc levels:  Moderate multilevel cervical spondylosis. Upper chest: Included lung apices are clear. Other: Bilateral carotid atherosclerosis. IMPRESSION: 1. No acute intracranial abnormality. 2. No acute maxillofacial bone fracture. 3. No acute fracture or subluxation of the cervical spine. Electronically Signed   By: Duanne Guess D.O.   On: 01/15/2024 11:42    Labs:  CBC: Recent Labs    08/29/23 1319 01/15/24 0931 01/15/24 1359 01/16/24 0425 01/16/24 1013 01/17/24 0450  WBC 9.9 11.2*  --  12.1*  --  14.1*  HGB 8.4 Repeated and verified X2.* 6.5* 6.1* 8.5* 8.9* 8.2*  HCT 25.7* 20.0* 18.1* 25.8* 27.9* 26.7*  PLT 305.0 433*  --  418*  --  403*    COAGS: Recent Labs    01/16/24 1013  INR 1.3*    BMP: Recent Labs    08/29/23 1319 01/15/24 0931 01/16/24 0425 01/17/24 0450  NA 134* 131* 131* 134*  K 4.5 4.7 3.8 3.6  CL 102 102 105 108  CO2 22 17* 14* 15*  GLUCOSE 213* 122* 122* 147*  BUN 26* 49* 43* 40*  CALCIUM 8.5 8.6* 8.8* 8.7*  CREATININE 1.50 2.49* 2.47* 2.50*   GFRNONAA  --  26* 26* 25*    LIVER FUNCTION TESTS: Recent Labs    06/15/23 1100 08/29/23 1319 09/12/23 1038 01/16/24 1013  BILITOT 0.3 0.5 0.4 0.7  AST 19 29 18 26   ALT 14 53 14 22  ALKPHOS 35* 144* 73 64  PROT 6.2 5.9* 6.1 6.9  ALBUMIN 3.6 3.3* 3.6 3.0*    TUMOR MARKERS: No results for input(s): "AFPTM", "CEA", "CA199", "CHROMGRNA" in the last 8760 hours.  Assessment and Plan: Liver lesion  Patient is a 78 y/o male with of anemia, HTN, GERD, left foot drop, aortic stenosis-s/p TAVR 2023, type 2 diabetes mellitus, malignant melanoma, RA,  and CVA. Patient was noted to have a liver lesion on CT abdomen/chest/pelvis and MRI liver 3/30 and 3/31. After review by Dr. Donne Hazel, patient was approved for a CT guided liver aspiration with IR. Patient is NPO starting at midnight and does not take anticoagulants. Patient wishes for his wife to provide consent. She was present during the encounter today.  Risks and benefits of liver aspiration was discussed with the patient and/or patient's family including, but not limited to bleeding, infection, damage to adjacent structures or low yield requiring additional tests.  All of the questions were answered and there is agreement to proceed.  Consent signed and in chart.   Thank you for this interesting consult.  I greatly enjoyed meeting Boleslaw B Weppler and look forward to participating in their care.  A copy of this report was sent to the requesting provider on this date.  Electronically Signed: Rosalita Levan, PA 01/17/2024, 3:54 PM   I spent a total of 20 Minutes    in face to face in clinical consultation, greater than 50% of which was counseling/coordinating care for liver lesion.

## 2024-01-17 NOTE — Assessment & Plan Note (Addendum)
-   per CT on 3/30: "subpleural solid noncalcified 6 x 7 mm nodule in the left lung lower lobe, which is grossly unchanged since the prior study but new since the prior study from 12/18/2021" - will plan on outpatient repeat CT chest in 2-3 months then possible biopsy after if still warranted

## 2024-01-17 NOTE — Anesthesia Postprocedure Evaluation (Signed)
 Anesthesia Post Note  Patient: Tommy Green  Procedure(s) Performed: EGD (ESOPHAGOGASTRODUODENOSCOPY)     Patient location during evaluation: PACU Anesthesia Type: MAC Level of consciousness: awake and alert Pain management: pain level controlled Vital Signs Assessment: post-procedure vital signs reviewed and stable Respiratory status: spontaneous breathing, nonlabored ventilation and respiratory function stable Cardiovascular status: blood pressure returned to baseline and stable Postop Assessment: no apparent nausea or vomiting Anesthetic complications: no   No notable events documented.  Last Vitals:  Vitals:   01/17/24 1119 01/17/24 1206  BP: (!) 149/49   Pulse: 83 80  Resp: 16 20  Temp: 36.5 C   SpO2: 100% 100%    Last Pain:  Vitals:   01/17/24 1206  TempSrc: Temporal  PainSc:                  [name]

## 2024-01-17 NOTE — Transfer of Care (Signed)
 Immediate Anesthesia Transfer of Care Note  Patient: Tommy Green  Procedure(s) Performed: EGD (ESOPHAGOGASTRODUODENOSCOPY)  Patient Location: Endoscopy Unit  Anesthesia Type:MAC  Level of Consciousness: drowsy  Airway & Oxygen Therapy: Patient Spontanous Breathing and Patient connected to face mask oxygen  Post-op Assessment: Report given to RN and Post -op Vital signs reviewed and stable  Post vital signs: Reviewed and stable  Last Vitals:  Vitals Value Taken Time  BP    Temp    Pulse    Resp    SpO2      Last Pain:  Vitals:   01/17/24 1119  TempSrc: Temporal  PainSc: 4          Complications: No notable events documented.

## 2024-01-17 NOTE — Interval H&P Note (Signed)
 History and Physical Interval Note:  01/17/2024 11:37 AM  Tommy Green  has presented today for surgery, with the diagnosis of Anemia.  The various methods of treatment have been discussed with the patient and family. After consideration of risks, benefits and other options for treatment, the patient has consented to  Procedure(s): EGD (ESOPHAGOGASTRODUODENOSCOPY) (N/A) as a surgical intervention.  The patient's history has been reviewed, patient examined, no change in status, stable for surgery.  I have reviewed the patient's chart and labs.  Questions were answered to the patient's satisfaction.     [employer]

## 2024-01-17 NOTE — Plan of Care (Signed)

## 2024-01-17 NOTE — Telephone Encounter (Signed)
 No call back number on note , unable to call [facility] back , will wait until pt is out of hospital

## 2024-01-18 ENCOUNTER — Encounter (HOSPITAL_COMMUNITY): Payer: Self-pay | Admitting: Gastroenterology

## 2024-01-18 ENCOUNTER — Inpatient Hospital Stay (HOSPITAL_COMMUNITY)

## 2024-01-18 DIAGNOSIS — D509 Iron deficiency anemia, unspecified: Secondary | ICD-10-CM | POA: Diagnosis not present

## 2024-01-18 DIAGNOSIS — D649 Anemia, unspecified: Secondary | ICD-10-CM | POA: Diagnosis not present

## 2024-01-18 DIAGNOSIS — R160 Hepatomegaly, not elsewhere classified: Secondary | ICD-10-CM | POA: Diagnosis not present

## 2024-01-18 DIAGNOSIS — R195 Other fecal abnormalities: Secondary | ICD-10-CM | POA: Diagnosis not present

## 2024-01-18 LAB — BASIC METABOLIC PANEL WITH GFR
Anion gap: 9 (ref 5–15)
BUN: 44 mg/dL — ABNORMAL HIGH (ref 8–23)
CO2: 16 mmol/L — ABNORMAL LOW (ref 22–32)
Calcium: 8.7 mg/dL — ABNORMAL LOW (ref 8.9–10.3)
Chloride: 111 mmol/L (ref 98–111)
Creatinine, Ser: 2.74 mg/dL — ABNORMAL HIGH (ref 0.61–1.24)
GFR, Estimated: 23 mL/min — ABNORMAL LOW (ref >60)
Glucose, Bld: 159 mg/dL — ABNORMAL HIGH (ref 70–99)
Potassium: 3.6 mmol/L (ref 3.5–5.1)
Sodium: 136 mmol/L (ref 135–145)

## 2024-01-18 LAB — CBC WITH DIFFERENTIAL/PLATELET
Abs Immature Granulocytes: 0.06 10*3/uL (ref 0.00–0.07)
Basophils Absolute: 0.1 10*3/uL (ref 0.0–0.1)
Basophils Relative: 0 %
Eosinophils Absolute: 0.2 10*3/uL (ref 0.0–0.5)
Eosinophils Relative: 2 %
HCT: 24.4 % — ABNORMAL LOW (ref 39.0–52.0)
Hemoglobin: 7.7 g/dL — ABNORMAL LOW (ref 13.0–17.0)
Immature Granulocytes: 1 %
Lymphocytes Relative: 17 %
Lymphs Abs: 2.2 10*3/uL (ref 0.7–4.0)
MCH: 26.6 pg (ref 26.0–34.0)
MCHC: 31.6 g/dL (ref 30.0–36.0)
MCV: 84.4 fL (ref 80.0–100.0)
Monocytes Absolute: 0.8 10*3/uL (ref 0.1–1.0)
Monocytes Relative: 6 %
Neutro Abs: 9.2 10*3/uL — ABNORMAL HIGH (ref 1.7–7.7)
Neutrophils Relative %: 74 %
Platelets: 343 10*3/uL (ref 150–400)
RBC: 2.89 MIL/uL — ABNORMAL LOW (ref 4.22–5.81)
RDW: 17.4 % — ABNORMAL HIGH (ref 11.5–15.5)
WBC: 12.5 10*3/uL — ABNORMAL HIGH (ref 4.0–10.5)
nRBC: 0 % (ref 0.0–0.2)

## 2024-01-18 LAB — MAGNESIUM: Magnesium: 1.8 mg/dL (ref 1.7–2.4)

## 2024-01-18 LAB — GLUCOSE, CAPILLARY
Glucose-Capillary: 132 mg/dL — ABNORMAL HIGH (ref 70–99)
Glucose-Capillary: 107 mg/dL — ABNORMAL HIGH (ref 70–99)
Glucose-Capillary: 117 mg/dL — ABNORMAL HIGH (ref 70–99)
Glucose-Capillary: 167 mg/dL — ABNORMAL HIGH (ref 70–99)

## 2024-01-18 MED ORDER — FENTANYL CITRATE (PF) 100 MCG/2ML IJ SOLN
INTRAMUSCULAR | Status: AC
  Filled 2024-01-18: qty 2

## 2024-01-18 MED ORDER — MIDAZOLAM HCL 2 MG/2ML IJ SOLN
INTRAMUSCULAR | Status: AC
  Filled 2024-01-18: qty 2

## 2024-01-18 MED ORDER — LACTATED RINGERS IV SOLN
INTRAVENOUS | Status: AC
Start: 2024-01-18 — End: 2024-01-19

## 2024-01-18 MED ORDER — FENTANYL CITRATE (PF) 100 MCG/2ML IJ SOLN
INTRAMUSCULAR | Status: AC | PRN
  Administered 2024-01-18 (×2): 50 ug via INTRAVENOUS

## 2024-01-18 MED ORDER — LIDOCAINE HCL 1 % IJ SOLN
INTRAMUSCULAR | Status: AC
  Filled 2024-01-18: qty 20

## 2024-01-18 MED ORDER — MIDAZOLAM HCL 2 MG/2ML IJ SOLN
INTRAMUSCULAR | Status: AC | PRN
  Administered 2024-01-18: 1 mg via INTRAVENOUS

## 2024-01-18 NOTE — Progress Notes (Signed)
 PROGRESS NOTE    Tommy Green  ZOX:[id number] DOB: 1946/07/01 DOA: 01/15/2024 PCP: [name], DO  Chief Complaint  Patient presents with   Urinary Frequency   Fall    Brief Narrative:    78 yo male with PMH malignant melanoma, DMII, RA, GERD, hx CVA, HLD who presented with weakness, recurrent falls, and some persistent dark stools.  He had fallen about 1 week prior in the bathroom and struck his right face resulting in Tommy Green bruised eye as well.  On workup he was noted to have Hgb 6.5 g/dL and underwent 2 units PRBC on admission. Multiple imaging studies were obtained due to his falls and no traumatic injuries were noted. He was however noted to have Tommy Green right hepatic lobe lesion measuring 4.2 x 4.8 cm along with Tommy Green left lower lobe nodule measuring 6 x 7 mm. GI was also consulted due to concern for GI bleed.  He has Tommy Green known history of esophageal varices,  Schatzki ring, and gastric polyps on last EGD in Nov 2021.    He was admitted for ongoing GI evaluation and further workup regarding masses noted on scans.    Assessment & Plan:   Principal Problem:   GI bleed Active Problems:   Acute renal failure superimposed on stage 3b chronic kidney disease (HCC)   Liver mass   Lung nodule   Essential hypertension   Hyperlipidemia associated with type 2 diabetes mellitus (HCC)   Type 2 diabetes mellitus with diabetic neuropathy, unspecified (HCC)   S/P TAVR (transcatheter aortic valve replacement)   BPH associated with nocturia   Rheumatoid arteritis (HCC)   Heme positive stool   Abnormal CT of liver   Acute on chronic anemia  GI Bleed  Anemia  Presented with Hb 6.5 on admission (was ~8-9 previously) S/p 2 units pRBC S/p EGD 4/1 -> with posterior oropharynx abnormal with congested mucosa and hematin, grade 1 esophageal varices noted proximally and in the middle of the esophagus.  No gross lesions in the distal esophagus.  Low grade narrowing schatzki ring.  5 cm hiatal  hernia.  Erythematous mucosa in the stomach (biopsied).  No evidence of portal gastropathy or varices of the stomach.  Biopsies pending.  GI recommending ENT evaluation, IR evaluation of hepatic lesion.  (See report)  Liver Lesion MRI 3/31 with multilobulated, internally septated rim enhancing lesion within the inferior right lobe of the liver, hepatic segment V/VI measuring 4.5x3.7 cm.  Second small component which is inserpatble from this larger lesion and the liver capsule situated just inferiorly measuring 2/5x2.4 cm.  Abscess vs cystic metastasis.  Small enhancing lesion in the anterior right lobe of the liver. S/p US aspiration and core biopsy 4/2 Currently on abx  AKI on CKD IIIb Baseline creatinine 1.8-2.2 Current creatinine 2.74 Will monitor with IVF  Mechanical Falls Therapy  Posterior oropharynx abnormal with congested mucosa and hematin  Will consult ENT  Proximal Upstream Varicose Veins Warrants additional follow up - per GI can be seen with significant congestive venous issues, r/o SVC syndrome or obstructive venous outflow  Upper extremity US  Lung Nodule 6x7 mm nodule in left lower lobe, warrants follow up  Rheumatoid Arthritis Enbrel chronically  E. Coli UTI On abx as above  BPH Finasteride  S/p TAVR   T2DM A1c 5.8 12/2023 SSI, basal insulin on hold  Hyperlipidemia Zetia  Hypertension Lisinopril on hold    DVT prophylaxis: SCD Code Status: full Family Communication: wife at bedside Disposition:  Status is: Inpatient Remains inpatient appropriate because: need for continued inpatient care   Consultants:  GI IR  Procedures:  Upper endoscopy US aspiration and core biopsy   Antimicrobials:  Anti-infectives (From admission, onward)    Start     Dose/Rate Route Frequency Ordered Stop   01/17/24 0845  metroNIDAZOLE (FLAGYL) IVPB 500 mg        500 mg 100 mL/hr over 60 Minutes Intravenous 2 times daily 01/17/24 0759     01/16/24 1000   cefTRIAXone (ROCEPHIN) 1 g in sodium chloride 0.9 % 100 mL IVPB        1 g 200 mL/hr over 30 Minutes Intravenous Every 24 hours 01/15/24 1600 01/20/24 0959   01/15/24 1000  cefTRIAXone (ROCEPHIN) 1 g in sodium chloride 0.9 % 100 mL IVPB        1 g 200 mL/hr over 30 Minutes Intravenous  Once 01/15/24 0951 01/15/24 1037       Subjective: Wife at bedside, discusses discomfort with urination   Objective: Vitals:   01/18/24 1425 01/18/24 1435 01/18/24 1440 01/18/24 1459  BP: (!) 185/72 (!) 165/75 (!) 159/75 (!) 171/70  Pulse: 68 69 72 73  Resp: 18 18 16 16   Temp:    (!) 97.5 F (36.4 C)  TempSrc:    Oral  SpO2: 100% 98% 98% 100%  Weight:      Height:        Intake/Output Summary (Last 24 hours) at 01/18/2024 1502 Last data filed at 01/18/2024 1400 Gross per 24 hour  Intake 1908.36 ml  Output 1100 ml  Net 808.36 ml   Filed Weights   01/15/24 0924  Weight: 97 kg    Examination:  General exam: Appears calm and comfortable  Respiratory system: unlabored Cardiovascular system:RRR Gastrointestinal system: Abdomen is nondistended, soft and nontender.  Central nervous system: Alert and oriented. No focal neurological deficits. Extremities: no LEE    Data Reviewed: I have personally reviewed following labs and imaging studies  CBC: Recent Labs  Lab 01/15/24 0931 01/15/24 1359 01/16/24 0425 01/16/24 1013 01/17/24 0450 01/18/24 0458  WBC 11.2*  --  12.1*  --  14.1* 12.5*  NEUTROABS 7.4  --   --   --  10.7* 9.2*  HGB 6.5* 6.1* 8.5* 8.9* 8.2* 7.7*  HCT 20.0* 18.1* 25.8* 27.9* 26.7* 24.4*  MCV 77.5*  --  81.9  --  85.6 84.4  PLT 433*  --  418*  --  403* 343    Basic Metabolic Panel: Recent Labs  Lab 01/15/24 0931 01/16/24 0425 01/17/24 0450 01/18/24 0458  NA 131* 131* 134* 136  K 4.7 3.8 3.6 3.6  CL 102 105 108 111  CO2 17* 14* 15* 16*  GLUCOSE 122* 122* 147* 159*  BUN 49* 43* 40* 44*  CREATININE 2.49* 2.47* 2.50* 2.74*  CALCIUM 8.6* 8.8* 8.7* 8.7*  MG  --    --  1.8 1.8    GFR: Estimated Creatinine Clearance: 25.5 mL/min (Tommy Green) (by C-G formula based on SCr of 2.74 mg/dL (H)).  Liver Function Tests: Recent Labs  Lab 01/16/24 1013  AST 26  ALT 22  ALKPHOS 64  BILITOT 0.7  PROT 6.9  ALBUMIN 3.0*    CBG: Recent Labs  Lab 01/17/24 1125 01/17/24 1723 01/17/24 2143 01/18/24 0734 01/18/24 1142  GLUCAP 118* 177* 125* 132* 107*     Recent Results (from the past 240 hours)  Urine Culture     Status: Abnormal   Collection Time: 01/11/24  1:33 PM   Specimen: Urine  Result Value Ref Range Status   MICRO NUMBER: [id number]  Final   SPECIMEN QUALITY: Adequate  Final   Sample Source NOT GIVEN  Final   STATUS: FINAL  Final   ISOLATE 1: Escherichia coli (Mallory Schaad)  Final    Comment: Greater than 100,000 CFU/mL of Escherichia coli      Susceptibility   Escherichia coli - URINE CULTURE, REFLEX    AMOX/CLAVULANIC >=32 Resistant     AMPICILLIN >=32 Resistant     AMPICILLIN/SULBACTAM 16 Intermediate     CEFAZOLIN* >=64 Resistant      * For uncomplicated UTI caused by E. coli, K. pneumoniae or P. mirabilis: Cefazolin is susceptible if MIC <32 mcg/mL and predicts susceptible to the oral agents cefaclor, cefdinir, cefpodoxime, cefprozil, cefuroxime, cephalexin and loracarbef.     CEFTAZIDIME <=1 Sensitive     CEFEPIME <=1 Sensitive     CEFTRIAXONE <=1 Sensitive     CIPROFLOXACIN <=0.25 Sensitive     LEVOFLOXACIN <=0.12 Sensitive     GENTAMICIN <=1 Sensitive     IMIPENEM <=0.25 Sensitive     NITROFURANTOIN <=16 Sensitive     PIP/TAZO <=4 Sensitive     TOBRAMYCIN <=1 Sensitive     TRIMETH/SULFA* <=20 Sensitive      * For uncomplicated UTI caused by E. coli, K. pneumoniae or P. mirabilis: Cefazolin is susceptible if MIC <32 mcg/mL and predicts susceptible to the oral agents cefaclor, cefdinir, cefpodoxime, cefprozil, cefuroxime, cephalexin and loracarbef. Legend: S = Susceptible  I = Intermediate R = Resistant  NS = Not susceptible SDD  = Susceptible Dose Dependent * = Not Tested  NR = Not Reported **NN = See Therapy Comments   Urine Culture     Status: Abnormal   Collection Time: 01/15/24  9:31 AM   Specimen: Urine, Random  Result Value Ref Range Status   Specimen Description   Final    URINE, RANDOM Performed at [facility], [address] [place], [place] [address]    Special Requests   Final    NONE Reflexed from [id number] Performed at [facility], [address] [place], [place] [address]    Culture >=100,000 COLONIES/mL ESCHERICHIA COLI (Tylor Courtwright)  Final   Report Status 01/17/2024 FINAL  Final   Organism ID, Bacteria ESCHERICHIA COLI (Branch Pacitti)  Final      Susceptibility   Escherichia coli - MIC*    AMPICILLIN >=32 RESISTANT Resistant     CEFAZOLIN >=64 RESISTANT Resistant     CEFEPIME <=0.12 SENSITIVE Sensitive     CEFTRIAXONE <=0.25 SENSITIVE Sensitive     CIPROFLOXACIN <=0.25 SENSITIVE Sensitive     GENTAMICIN <=1 SENSITIVE Sensitive     IMIPENEM <=0.25 SENSITIVE Sensitive     NITROFURANTOIN <=16 SENSITIVE Sensitive     TRIMETH/SULFA <=20 SENSITIVE Sensitive     AMPICILLIN/SULBACTAM >=32 RESISTANT Resistant     PIP/TAZO 16 SENSITIVE Sensitive ug/mL    * >=100,000 COLONIES/mL ESCHERICHIA COLI  Blood culture (routine x 2)     Status: None (Preliminary result)   Collection Time: 01/15/24 10:05 AM   Specimen: BLOOD LEFT ARM  Result Value Ref Range Status   Specimen Description   Final    BLOOD LEFT ARM Performed at [facility], [address] [place], [place] [address]    Special Requests   Final    BOTTLES DRAWN AEROBIC AND ANAEROBIC Blood Culture adequate volume Performed at [facility]  [address], [place], [place] [address]    Culture   Final    NO GROWTH 3 DAYS Performed at [facility], [address], [place], [place] [address]    Report Status PENDING  Incomplete  Blood culture (routine x 2)     Status: None (Preliminary result)   Collection  Time: 01/15/24 10:05 AM   Specimen: BLOOD RIGHT ARM  Result Value Ref Range Status   Specimen Description   Final    BLOOD RIGHT ARM Performed at [facility], [address] [place], [place] [address]    Special Requests   Final    BOTTLES DRAWN AEROBIC AND ANAEROBIC Blood Culture adequate volume Performed at [facility], [address] [place], [place] [address]    Culture   Final    NO GROWTH 3 DAYS Performed at [facility], [address], [place], [place] [address]    Report Status PENDING  Incomplete         Radiology Studies: MR LIVER W WO CONTRAST Result Date: 01/16/2024 CLINICAL DATA:  Liver lesion, malignancy suspected EXAM: MRI ABDOMEN WITHOUT AND WITH CONTRAST TECHNIQUE: Multiplanar multisequence MR imaging of the abdomen was performed both before and after the administration of intravenous contrast. CONTRAST:  10mL GADAVIST GADOBUTROL 1 MMOL/ML IV SOLN COMPARISON:  CT chest abdomen pelvis, 01/15/2024 FINDINGS: Lower chest: No acute abnormality.  Small hiatal hernia. Hepatobiliary: Multilobulated, internally septated rim enhancing lesion within the inferior right lobe of the liver, hepatic segment V/VI measuring 4.5 x 3.7 cm (series 11, image 46). Second small component which is inseparable from this larger lesion and the liver capsule situated just inferiorly measuring 2.5 x 2.4 cm (series 11, image 63). These lesions demonstrate internal diffusion restriction. Additional small enhancing lesion in the anterior right lobe of the liver, hepatic segment VIII measuring 1.2 x 1.0 cm without internal hypoenhancement or fluid character (series 11, image 24). Status post cholecystectomy. No biliary ductal dilatation. Pancreas: Unremarkable. No pancreatic ductal dilatation or surrounding inflammatory changes. Spleen: Normal in size without significant abnormality. Adrenals/Urinary Tract: Adrenal glands are unremarkable. Layering hemorrhagic or proteinaceous  cyst arising from the posterior superior pole of the left kidney benign, requiring no further follow-up or characterization (series 11, image 50). Kidneys are otherwise normal, without obvious renal calculi, solid lesion, or hydronephrosis. Stomach/Bowel: Stomach is within normal limits. No evidence of bowel wall thickening, distention, or inflammatory changes. Vascular/Lymphatic: No significant vascular findings are present. No enlarged abdominal lymph nodes. Other: No abdominal wall hernia or abnormality. No ascites. Musculoskeletal: No acute or significant osseous findings. IMPRESSION: 1. Multilobulated, internally septated rim enhancing lesion within the inferior right lobe of the liver, hepatic segment V/VI measuring 4.5 x 3.7 cm. Second small component which is inseparable from this larger lesion and the liver capsule situated just inferiorly measuring 2.5 x 2.4 cm. Imaging characteristics are strongly suggestive of hepatic abscess, however Kaedynce Tapp cystic metastasis could also have this appearance depending upon clinical presentation. 2. Additional small enhancing lesion in the anterior right lobe of the liver, hepatic segment VIII measuring 1.2 x 1.0 cm without internal hypoenhancement or fluid character. This is less specific, and could reflect Arnetia Bronk small phlegmon or additional metastasis. 3. No lymphadenopathy or other evidence of metastatic disease in the abdomen. Electronically Signed   By: Jearld Lesch M.D.   On: 01/16/2024 22:38        Scheduled Meds:  escitalopram  20 mg  Oral Daily   ezetimibe  10 mg Oral QPM   fenofibrate  54 mg Oral Daily   finasteride  5 mg Oral Daily   insulin aspart  0-15 Units Subcutaneous TID WC   insulin aspart  0-5 Units Subcutaneous QHS   melatonin  5 mg Oral QHS   pantoprazole  40 mg Oral Daily   QUEtiapine  25 mg Oral QHS   Continuous Infusions:  cefTRIAXone (ROCEPHIN)  IV 1 g (01/18/24 1035)   metronidazole 500 mg (01/18/24 0920)     LOS: 3 days    Time  spent: over 30 min    Lacretia Nicks, MD [facility]   To contact the attending provider between 7A-7P or the covering provider during after hours 7P-7A, please log into the web site [email or web address] and access using universal [place] password for that web site. If you do not have the password, please call the hospital operator.  01/18/2024, 3:02 PM

## 2024-01-18 NOTE — Progress Notes (Addendum)
 Tommy Green  CC:  Anemia, positive FOBT and liver mass    Subjective: He is NPO for liver biopsy today. He denies nausea or vomiting.  No abdominal pain.  He passed a bowel movement this morning change of shift, patient and nursing staff unaware of what type of stool passed. His RN stated he passed a yellow water stool yesterday. No CP or SOB. Wife and on at the bed side.   Objective:   EGD by Dr. Meridee Score 01/17/2024: - The posterior oropharynx is abnormal with congested mucosa and hematin being notedthis needs further clarification and evaluation.  - Grade I esophageal varices noted proximally and in the middle of the esophagus.  - No gross lesions in the distal esophagus. And no evidence of varices noted distally.  - Low-grade of narrowing Schatzki ring.  - 5 cm hiatal hernia.  - Erythematous mucosa in the stomach. Biopsied.  - No evidence of portal gastropathy or varices of the stomach.  - No gross lesions in the duodenal bulb, in the first portion of the duodenum and in the second portion of the duodenum. Biopsied.  Vital signs in last 24 hours: Temp:  [97.5 F (36.4 C)-98.2 F (36.8 C)] 97.8 F (36.6 C) (04/02 0728) Pulse Rate:  [70-89] 89 (04/02 0728) Resp:  [14-20] 18 (04/02 0728) BP: (134-161)/(49-121) 161/81 (04/02 0728) SpO2:  [96 %-100 %] 100 % (04/02 0728) Last BM Date : 01/17/24 General: Fatigued appearing 78 year old male in no acute distress. Heart: Regular rate and rhythm, no murmurs. Pulm: Sounds clear throughout. Abdomen: Soft, mild distention. Mild tenderness to the epigastrium to the supraumbilical area without rebound or guarding.  Positive bowel sounds to all 4 quadrants.  No palpable mass or hepatomegaly. Extremities: No edema. Neurologic:  Alert and  oriented x 4. Grossly normal neurologically. Psych:  Alert and cooperative. Normal mood and affect.  Intake/Output from previous day: 04/01 0701 - 04/02 0700 In: 2097.6  [P.O.:840; I.V.:957.6; IV Piggyback:300] Out: 375 [Urine:375] Intake/Output this shift: No intake/output data recorded.  Lab Results: Recent Labs    01/16/24 0425 01/16/24 1013 01/17/24 0450 01/18/24 0458  WBC 12.1*  --  14.1* 12.5*  HGB 8.5* 8.9* 8.2* 7.7*  HCT 25.8* 27.9* 26.7* 24.4*  PLT 418*  --  403* 343   BMET Recent Labs    01/16/24 0425 01/17/24 0450 01/18/24 0458  NA 131* 134* 136  K 3.8 3.6 3.6  CL 105 108 111  CO2 14* 15* 16*  GLUCOSE 122* 147* 159*  BUN 43* 40* 44*  CREATININE 2.47* 2.50* 2.74*  CALCIUM 8.8* 8.7* 8.7*   LFT Recent Labs    01/16/24 1013  PROT 6.9  ALBUMIN 3.0*  AST 26  ALT 22  ALKPHOS 64  BILITOT 0.7  BILIDIR 0.1  IBILI 0.6   PT/INR Recent Labs    01/16/24 1013  LABPROT 16.0*  INR 1.3*   Hepatitis Panel No results for input(s): "HEPBSAG", "HCVAB", "HEPAIGM", "HEPBIGM" in the last 72 hours.  MR LIVER W WO CONTRAST Result Date: 01/16/2024 CLINICAL DATA:  Liver lesion, malignancy suspected EXAM: MRI ABDOMEN WITHOUT AND WITH CONTRAST TECHNIQUE: Multiplanar multisequence MR imaging of the abdomen was performed both before and after the administration of intravenous contrast. CONTRAST:  10mL GADAVIST GADOBUTROL 1 MMOL/ML IV SOLN COMPARISON:  CT chest abdomen pelvis, 01/15/2024 FINDINGS: Lower chest: No acute abnormality.  Small hiatal hernia. Hepatobiliary: Multilobulated, internally septated rim enhancing lesion within the inferior right lobe of the liver, hepatic  segment V/VI measuring 4.5 x 3.7 cm (series 11, image 46). Second small component which is inseparable from this larger lesion and the liver capsule situated just inferiorly measuring 2.5 x 2.4 cm (series 11, image 63). These lesions demonstrate internal diffusion restriction. Additional small enhancing lesion in the anterior right lobe of the liver, hepatic segment VIII measuring 1.2 x 1.0 cm without internal hypoenhancement or fluid character (series 11, image 24). Status post  cholecystectomy. No biliary ductal dilatation. Pancreas: Unremarkable. No pancreatic ductal dilatation or surrounding inflammatory changes. Spleen: Normal in size without significant abnormality. Adrenals/Urinary Tract: Adrenal glands are unremarkable. Layering hemorrhagic or proteinaceous cyst arising from the posterior superior pole of the left kidney benign, requiring no further follow-up or characterization (series 11, image 50). Kidneys are otherwise normal, without obvious renal calculi, solid lesion, or hydronephrosis. Stomach/Bowel: Stomach is within normal limits. No evidence of bowel wall thickening, distention, or inflammatory changes. Vascular/Lymphatic: No significant vascular findings are present. No enlarged abdominal lymph nodes. Other: No abdominal wall hernia or abnormality. No ascites. Musculoskeletal: No acute or significant osseous findings. IMPRESSION: 1. Multilobulated, internally septated rim enhancing lesion within the inferior right lobe of the liver, hepatic segment V/VI measuring 4.5 x 3.7 cm. Second small component which is inseparable from this larger lesion and the liver capsule situated just inferiorly measuring 2.5 x 2.4 cm. Imaging characteristics are strongly suggestive of hepatic abscess, however a cystic metastasis could also have this appearance depending upon clinical presentation. 2. Additional small enhancing lesion in the anterior right lobe of the liver, hepatic segment VIII measuring 1.2 x 1.0 cm without internal hypoenhancement or fluid character. This is less specific, and could reflect a small phlegmon or additional metastasis. 3. No lymphadenopathy or other evidence of metastatic disease in the abdomen. Electronically Signed   By: Jearld Lesch M.D.   On: 01/16/2024 22:38   Patient Profile: Tommy Green. Tommy Green is a 78 year old male with a past medical history of hypertension, nonobstructive CAD, AS s/p TAVR 01/2022, DM type II, CKD stage IV, rheumatoid arthritis, BPH,  IDA and colon polyps. S/P cholecystectomy 08/2023. He presented to [facility] ED 01/15/2024 with suspected UTI and recent multiple mechanical fall at home found to have significant anemia.   Assessment / Plan:  78 year old male with a history of IDA admitted 01/15/2024 with suspected UTI, mechanical falls and profound anemia. Admission Hg 6.5 -> 6.1 (base line Hg 8 - 10). FOBT positive. Transfused 2 units of PRBCs -> Hg 8.5. Prior EGD 08/2020 showed grade I esophageal varices (downhill varices) without evidence of portal hypertensive gastropathy. Duodenal biopsies negative for celiac disease. Colonoscopy identified two small tubular adenomatous polyps removed from the transverse colon, sigmoid diverticulosis and internal hemorrhoids. EGD during this hospitalization 01/17/2024 showed an abnormal posterior pharynx with congested mucosa and hematin, grade 1 esophageal varices proximally and in the esophagus, low-grade Schatzki's ring, 5 cm hiatal hernia and mild erythematous mucosa in the stomach and a normal duodenum. -ENT consult for evaluation of the posterior oropharynx and area of significant congestion swelling with hematin seen on EGD -Findings of proximal upstream varicose veins per EGD, can at times be seen with significant congestive venous issues, consider evaluation of the vasculature and rule out SVC syndrome or obstructive venous outflow, defer to the medical service  -Transfuse for Hg < 8 -CBC in am -Continue Pantoprazole 40mg  every day -Await further recommendations per Dr. Marina Goodell   AKI of CKD. Admission Cr 2.49 (baseline Cr 1.5) ->  2.47 -> 2.5 -> 2.74. -IV fluids per the hospitalist -Consider renal consult    Liver mass per CTAP without contrast identified an ill-defined 4.2 x 4.8 cm mass in the right hepatic lobe which is concerning for neoplastic process with an additional smaller approximately 1 cm sized hypoattenuating structure in the right hepatic lobe and a 1.4 x 1.7 soft  tissue nodule in the RUQ possibly representing metastatic deposit. Liver MRI 01/16/2024 showed multilobulated, internally septated rim enhancing lesion within the inferior right lobe of the liver measuring 4.5 x 3.7 cm, second small component which is inseparable from this larger lesion measuring 2.5 x 2.4 cm concerning for possible hepatic abscess vs a cystic mass and an additional small enhancing lesion in the anterior right lobe of the liver measuring 1.2 x 1.0 cm which could reflect a small phlegmon or additional metastasis.   Normal LFTs. AFP 4.8. Hepatitis B surface antigen negative and Hep C antibody nonreactive 04/2020. Hemodynamically stable.  -NPO for liver biopsy per IR today    Mechanical falls at home. Head without acute intracranial abnormality. Cervical spine and lumbar spine CT negative for acute injury/fracture.    History of colon polyps. Colonoscopy 08/2020 identified two tubular adenomatous polyps removed the transverse colon.  -Next colon polyps surveillance colonoscopy due 08/2027   Nonobstructive CAD per cardiac catheterization 11/2021. LV EF 55 - 60% per ECHO 01/2023.    History of AS s/p TAVR 01/2022     Principal Problem:   GI bleed Active Problems:   Essential hypertension   Hyperlipidemia associated with type 2 diabetes mellitus (HCC)   Type 2 diabetes mellitus with diabetic neuropathy, unspecified (HCC)   Acute renal failure superimposed on stage 3b chronic kidney disease (HCC)   S/P TAVR (transcatheter aortic valve replacement)   BPH associated with nocturia   Liver mass   Rheumatoid arteritis (HCC)   Heme positive stool   Abnormal CT of liver   Acute on chronic anemia   Lung nodule     LOS: 3 days   Malachi Carl Kennedy-Smith  01/18/2024, 10:37 AM  GI ATTENDING  Interval history and data reviewed.  Agree with interval progress Green as outlined above.  No GI bleeding.  Hemoglobin stable.  EGD underwhelming (save pharyngeal abnormality, which is to be  evaluated by ENT).  Plans at this point are for liver biopsy to rule out malignancy versus abscess.  No new plans from GI perspective at present.  If biopsy shows malignancy, involve oncology.  If biopsy shows abscess, drain and consult ID.  We will follow peripherally at this point.  Wilhemina Bonito. Eda Keys., M.D. [facility] Division of Gastroenterology

## 2024-01-18 NOTE — Procedures (Signed)
  Procedure:  US aspiration and core biopsy R liver process   Preprocedure diagnosis: The primary encounter diagnosis was Urinary tract infection with hematuria, site unspecified. Diagnoses of Anemia, unspecified type, AKI (acute kidney injury) (HCC), Weakness, and Gastrointestinal hemorrhage, unspecified gastrointestinal hemorrhage type were also pertinent to this visit. Postprocedure diagnosis: same EBL:    minimal Complications:   none immediate  See full dictation in [employer].  Thora Lance MD Main # [phone] Pager  [phone] Mobile [phone]

## 2024-01-18 NOTE — Plan of Care (Signed)

## 2024-01-19 ENCOUNTER — Inpatient Hospital Stay (HOSPITAL_COMMUNITY)

## 2024-01-19 DIAGNOSIS — K750 Abscess of liver: Secondary | ICD-10-CM

## 2024-01-19 DIAGNOSIS — R609 Edema, unspecified: Secondary | ICD-10-CM

## 2024-01-19 DIAGNOSIS — D649 Anemia, unspecified: Secondary | ICD-10-CM | POA: Diagnosis not present

## 2024-01-19 LAB — GLUCOSE, CAPILLARY
Glucose-Capillary: 146 mg/dL — ABNORMAL HIGH (ref 70–99)
Glucose-Capillary: 169 mg/dL — ABNORMAL HIGH (ref 70–99)
Glucose-Capillary: 132 mg/dL — ABNORMAL HIGH (ref 70–99)
Glucose-Capillary: 192 mg/dL — ABNORMAL HIGH (ref 70–99)

## 2024-01-19 LAB — CBC WITH DIFFERENTIAL/PLATELET
Abs Immature Granulocytes: 0.08 10*3/uL — ABNORMAL HIGH (ref 0.00–0.07)
Basophils Absolute: 0.1 10*3/uL (ref 0.0–0.1)
Basophils Relative: 0 %
Eosinophils Absolute: 0 10*3/uL (ref 0.0–0.5)
Eosinophils Relative: 0 %
HCT: 25.9 % — ABNORMAL LOW (ref 39.0–52.0)
Hemoglobin: 7.7 g/dL — ABNORMAL LOW (ref 13.0–17.0)
Immature Granulocytes: 1 %
Lymphocytes Relative: 13 %
Lymphs Abs: 2 10*3/uL (ref 0.7–4.0)
MCH: 26.8 pg (ref 26.0–34.0)
MCHC: 29.7 g/dL — ABNORMAL LOW (ref 30.0–36.0)
MCV: 90.2 fL (ref 80.0–100.0)
Monocytes Absolute: 0.5 10*3/uL (ref 0.1–1.0)
Monocytes Relative: 3 %
Neutro Abs: 13.5 10*3/uL — ABNORMAL HIGH (ref 1.7–7.7)
Neutrophils Relative %: 83 %
Platelets: 391 10*3/uL (ref 150–400)
RBC: 2.87 MIL/uL — ABNORMAL LOW (ref 4.22–5.81)
RDW: 17.6 % — ABNORMAL HIGH (ref 11.5–15.5)
WBC: 16.2 10*3/uL — ABNORMAL HIGH (ref 4.0–10.5)
nRBC: 0 % (ref 0.0–0.2)

## 2024-01-19 LAB — BASIC METABOLIC PANEL WITH GFR
Anion gap: 13 (ref 5–15)
BUN: 43 mg/dL — ABNORMAL HIGH (ref 8–23)
CO2: 13 mmol/L — ABNORMAL LOW (ref 22–32)
Calcium: 8.6 mg/dL — ABNORMAL LOW (ref 8.9–10.3)
Chloride: 110 mmol/L (ref 98–111)
Creatinine, Ser: 2.57 mg/dL — ABNORMAL HIGH (ref 0.61–1.24)
GFR, Estimated: 25 mL/min — ABNORMAL LOW (ref >60)
Glucose, Bld: 153 mg/dL — ABNORMAL HIGH (ref 70–99)
Potassium: 3.4 mmol/L — ABNORMAL LOW (ref 3.5–5.1)
Sodium: 136 mmol/L (ref 135–145)

## 2024-01-19 LAB — MAGNESIUM: Magnesium: 1.7 mg/dL (ref 1.7–2.4)

## 2024-01-19 LAB — SURGICAL PATHOLOGY

## 2024-01-19 MED ORDER — SODIUM CHLORIDE 0.9 % IV SOLN
2.0000 g | INTRAVENOUS | Status: DC
  Administered 2024-01-20 – 2024-01-25 (×6): 2 g via INTRAVENOUS
  Filled 2024-01-19 (×6): qty 20

## 2024-01-19 NOTE — Progress Notes (Signed)
 OT Note  Pt seen for OT eval. Full note to follow. Patient will benefit from continued inpatient follow up therapy, <3 hours/day . Pt has been to [facility] in the past.   Luisa Dago, OT/L   Acute OT Clinical Specialist Acute Rehabilitation Services Pager [phone] Office [phone]

## 2024-01-19 NOTE — Progress Notes (Signed)
 Bilateral upper extremity venous duplex has been completed.  Results can be found in chart review under CV Proc.  01/19/2024 5:05 PM  Tommy Green Age, RVT.

## 2024-01-19 NOTE — Progress Notes (Signed)
 GI ATTENDING  Appears to have hepatic abscess. Being seen by ID. Nothing further to add from GI perspective. Will sign off. Thanks.  Wilhemina Bonito. Eda Keys., M.D. [facility] Division of Gastroenterology

## 2024-01-19 NOTE — Consult Note (Signed)
 [facility]    Date of Admission:  01/15/2024     Reason for Consult: liver abscess    Referring Provider: Powel     Lines:  Peripheral iv's  Abx: Ceftriaxone flagyl        Assessment:  78 y.o. male former smoker, AS s/p tavr 01/2022, RA, hx gerd, dm2, hx cva, malignant melanoma, iron deficiency anemia, hx esophageal varices/portal hypertensive gastropathy (negative celiac disease on duodenal biopsy in 2021), recent possible uti, admitted for months to a year of progressive decline in subjective health, found to have severe anemia hb 6.1, and imaging of liver abscess vs metastasis   Hx melanoma, and progressive physical health but have tavr as well. Imaging with lesions concerning for liver mets which might be just infection or superimposed infection. ?possible dvt unprovoked upper ext also suggestive of cancer if confirmed dvt  No obvious sign of chf but gi did mention portal hypertensive changes ?from cardiac. Consider repeat echo. Tavr failure/restenosis could present with fatigue/chf sx as well  Path liver and duodenal pending  4/2 liver lesion cx GNR  Probably if available, an oral regimen of 4 weeks could be planned.    Plan: Malignancy w/u per primary team Consider repeat echo Liver abscess tx continue ceftriaxone/flagyl for now Change to augmentin if sensitive on culture Maintain standard isolation precaution Discussed with primary team      ------------------------------------------------ Principal Problem:   GI bleed Active Problems:   Essential hypertension   Hyperlipidemia associated with type 2 diabetes mellitus (HCC)   Type 2 diabetes mellitus with diabetic neuropathy, unspecified (HCC)   Acute renal failure superimposed on stage 3b chronic kidney disease (HCC)   S/P TAVR (transcatheter aortic valve replacement)   BPH associated with nocturia   Liver mass   Rheumatoid arteritis (HCC)   Heme positive stool    Abnormal CT of liver   Acute on chronic anemia   Lung nodule    HPI: Tommy Green is a 78 y.o. male former smoker, AS s/p tavr 01/2022, RA, hx gerd, dm2, hx cva, malignant melanoma, iron deficiency anemia, hx esophageal varices/portal hypertensive gastropathy (negative celiac disease on duodenal biopsy in 2021), recent possible uti, admitted for months to a year of progressive decline in subjective health, found to have severe anemia hb 6.1, and imaging of liver abscess vs metastasis   His wife is saying that his physical health has been in decline over the past year and he is getting weaker without specific complaint. ?weight loss. More frequent falls  His primary care team has treated him for uti's  His falls has been more frequent. Wife brought him in due to this  Mild leukocytosis on presentation no sign of sepsis otherwise Acute on chronic anemia on labs and pyruia Started on ceftriaxone for presumed uti  Gi involved -- egd 4/1 - The posterior oropharynx is abnormal with congested mucosa and hematin being notedthis needs further clarification and evaluation.  - Grade I esophageal varices noted proximally and in the middle of the esophagus.  - No gross lesions in the distal esophagus. And no evidence of varices noted distally.  - Low-grade of narrowing Schatzki ring.  - 5 cm hiatal hernia.  - Erythematous mucosa in the stomach. Biopsied.  - No evidence of portal gastropathy or varices of the stomach.  - No gross lesions in the duodenal bulb, in the first portion of the duodenum and in the second portion  of the duodenum. Biopsied. No colonoscopyu planned at this time    Patient also has aki on ckd at presentation (baseline 1.5) 2.5   He had abd ct done in ed that showed possible liver mets and mri was done that is more suggestive of abscess  Underwent IR sampling (culture and biopsy of the large lesion) on 4/2  Also has upper ext duplex u/s and might have dvt's based on  tech's discussion awaiting final read   Abx broadened to include metronidazole  Family History  Problem Relation Age of Onset   Hyperlipidemia Mother    Colon polyps Mother    Hyperlipidemia Father    Alcoholism Brother    Healthy Son    Healthy Son    Cancer Neg Hx    Colon cancer Neg Hx    Esophageal cancer Neg Hx    Rectal cancer Neg Hx    Stomach cancer Neg Hx     Social History   Tobacco Use   Smoking status: Former    Current packs/day: 0.00    Average packs/day: 1 pack/day for 10.0 years (10.0 ttl pk-yrs)    Types: Cigarettes    Start date: 1963    Quit date: 24    Years since quitting: 52.2    Passive exposure: Past   Smokeless tobacco: Never  Vaping Use   Vaping status: Never Used  Substance Use Topics   Alcohol use: Not Currently   Drug use: No    Allergies  Allergen Reactions   Canagliflozin Other (See Comments)    Pt reports that the use of Invokana caused acute KIDNEY FAILURE ([facility] records) and "allergic," per [facility]   Aspirin Other (See Comments)    Gastroesophageal reflux and "allergic," per [facility]    Atorvastatin Other (See Comments)    Myalgias, GI upset, and "allergic," per [facility]   Statins Other (See Comments)    Muscle pain- "allergic," per [facility]    Review of Systems: ROS All Other ROS was negative, except mentioned above   Past Medical History:  Diagnosis Date   Anemia    Essential hypertension 07/18/2017   Foot drop, left foot    due to back surgery in 01/2021   GERD (gastroesophageal reflux disease)    History of colonic polyps    History of hiatal hernia    many years ago   History of rheumatic fever    History of stroke 07/18/2017   Hypertriglyceridemia 07/18/2017   S/P TAVR (transcatheter aortic valve replacement) 01/26/2022   Edwards S3UR 29mm via TF approach with Dr. Lynnette Caffey and Dr. Laneta Simmers   Severe aortic stenosis    Type 2 diabetes mellitus with diabetic neuropathy, unspecified (HCC) 07/18/2017       Scheduled Meds:   escitalopram  20 mg Oral Daily   ezetimibe  10 mg Oral QPM   fenofibrate  54 mg Oral Daily   finasteride  5 mg Oral Daily   insulin aspart  0-15 Units Subcutaneous TID WC   insulin aspart  0-5 Units Subcutaneous QHS   melatonin  5 mg Oral QHS   pantoprazole  40 mg Oral Daily   QUEtiapine  25 mg Oral QHS   Continuous Infusions:  [START ON 01/20/2024] cefTRIAXone (ROCEPHIN)  IV     lactated ringers 100 mL/hr at 01/18/24 1623   metronidazole 500 mg (01/19/24 1046)   PRN Meds:.acetaminophen **OR** acetaminophen, albuterol, fluticasone, ondansetron **OR** ondansetron (ZOFRAN) IV   OBJECTIVE: Blood pressure (!) 160/77, pulse 92, temperature 97.7  F (36.5 C), temperature source Oral, resp. rate 18, height 5\' 11"  (1.803 m), weight 97 kg, SpO2 98%.  Physical Exam General/constitutional: no distress, pleasant HEENT: Normocephalic, PER, Conj Clear, EOMI, Oropharynx clear; right periorbital echymosis (he said he had a fall at home) Neck supple CV: rrr no mrg Lungs: clear to auscultation, normal respiratory effort Abd: Soft, Nontender Ext: no edema Skin: No Rash (echymosis right periorbital -- right arm scar prior surgery of some sort) Neuro: nonfocal MSK: no peripheral joint swelling/tenderness/warmth; back spines nontender      Lab Results Lab Results  Component Value Date   WBC 16.2 (H) 01/19/2024   HGB 7.7 (L) 01/19/2024   HCT 25.9 (L) 01/19/2024   MCV 90.2 01/19/2024   PLT 391 01/19/2024    Lab Results  Component Value Date   CREATININE 2.57 (H) 01/19/2024   BUN 43 (H) 01/19/2024   NA 136 01/19/2024   K 3.4 (L) 01/19/2024   CL 110 01/19/2024   CO2 13 (L) 01/19/2024    Lab Results  Component Value Date   ALT 22 01/16/2024   AST 26 01/16/2024   ALKPHOS 64 01/16/2024   BILITOT 0.7 01/16/2024      Microbiology: Recent Results (from the past 240 hours)  Urine Culture     Status: Abnormal   Collection Time: 01/11/24  1:33 PM   Specimen: Urine  Result Value Ref  Range Status   MICRO NUMBER: [id number]  Final   SPECIMEN QUALITY: Adequate  Final   Sample Source NOT GIVEN  Final   STATUS: FINAL  Final   ISOLATE 1: Escherichia coli (A)  Final    Comment: Greater than 100,000 CFU/mL of Escherichia coli      Susceptibility   Escherichia coli - URINE CULTURE, REFLEX    AMOX/CLAVULANIC >=32 Resistant     AMPICILLIN >=32 Resistant     AMPICILLIN/SULBACTAM 16 Intermediate     CEFAZOLIN* >=64 Resistant      * For uncomplicated UTI caused by E. coli, K. pneumoniae or P. mirabilis: Cefazolin is susceptible if MIC <32 mcg/mL and predicts susceptible to the oral agents cefaclor, cefdinir, cefpodoxime, cefprozil, cefuroxime, cephalexin and loracarbef.     CEFTAZIDIME <=1 Sensitive     CEFEPIME <=1 Sensitive     CEFTRIAXONE <=1 Sensitive     CIPROFLOXACIN <=0.25 Sensitive     LEVOFLOXACIN <=0.12 Sensitive     GENTAMICIN <=1 Sensitive     IMIPENEM <=0.25 Sensitive     NITROFURANTOIN <=16 Sensitive     PIP/TAZO <=4 Sensitive     TOBRAMYCIN <=1 Sensitive     TRIMETH/SULFA* <=20 Sensitive      * For uncomplicated UTI caused by E. coli, K. pneumoniae or P. mirabilis: Cefazolin is susceptible if MIC <32 mcg/mL and predicts susceptible to the oral agents cefaclor, cefdinir, cefpodoxime, cefprozil, cefuroxime, cephalexin and loracarbef. Legend: S = Susceptible  I = Intermediate R = Resistant  NS = Not susceptible SDD = Susceptible Dose Dependent * = Not Tested  NR = Not Reported **NN = See Therapy Comments   Urine Culture     Status: Abnormal   Collection Time: 01/15/24  9:31 AM   Specimen: Urine, Random  Result Value Ref Range Status   Specimen Description   Final    URINE, RANDOM Performed at [facility], [address] [place], [place] [address]    Special Requests   Final    NONE Reflexed from [id number] Performed at [facility],  [address] [employer] [place], [place] [address]    Culture >=100,000 COLONIES/mL ESCHERICHIA COLI  (A)  Final   Report Status 01/17/2024 FINAL  Final   Organism ID, Bacteria ESCHERICHIA COLI (A)  Final      Susceptibility   Escherichia coli - MIC*    AMPICILLIN >=32 RESISTANT Resistant     CEFAZOLIN >=64 RESISTANT Resistant     CEFEPIME <=0.12 SENSITIVE Sensitive     CEFTRIAXONE <=0.25 SENSITIVE Sensitive     CIPROFLOXACIN <=0.25 SENSITIVE Sensitive     GENTAMICIN <=1 SENSITIVE Sensitive     IMIPENEM <=0.25 SENSITIVE Sensitive     NITROFURANTOIN <=16 SENSITIVE Sensitive     TRIMETH/SULFA <=20 SENSITIVE Sensitive     AMPICILLIN/SULBACTAM >=32 RESISTANT Resistant     PIP/TAZO 16 SENSITIVE Sensitive ug/mL    * >=100,000 COLONIES/mL ESCHERICHIA COLI  Blood culture (routine x 2)     Status: None (Preliminary result)   Collection Time: 01/15/24 10:05 AM   Specimen: BLOOD LEFT ARM  Result Value Ref Range Status   Specimen Description   Final    BLOOD LEFT ARM Performed at [facility], [address] [place], [place] [address]    Special Requests   Final    BOTTLES DRAWN AEROBIC AND ANAEROBIC Blood Culture adequate volume Performed at [facility], [address] [place], [place] [address]    Culture   Final    NO GROWTH 4 DAYS Performed at [facility], [address], [place], [place] [address]    Report Status PENDING  Incomplete  Blood culture (routine x 2)     Status: None (Preliminary result)   Collection Time: 01/15/24 10:05 AM   Specimen: BLOOD RIGHT ARM  Result Value Ref Range Status   Specimen Description   Final    BLOOD RIGHT ARM Performed at [facility], [address] [place], [place] [address]    Special Requests   Final    BOTTLES DRAWN AEROBIC AND ANAEROBIC Blood Culture adequate volume Performed at [facility], [address] [place], [place] [address]    Culture   Final    NO GROWTH 4 DAYS Performed at [facility], [address], [place], [place] [address]    Report Status PENDING   Incomplete  Aerobic/Anaerobic Culture w Gram Stain (surgical/deep wound)     Status: None (Preliminary result)   Collection Time: 01/18/24  3:07 PM   Specimen: Abscess  Result Value Ref Range Status   Specimen Description   Final    ABSCESS Performed at [facility], [address], [place], [place] [address]    Special Requests   Final    NONE Performed at [facility], [address], [place], [place] [address]    Gram Stain   Final    MODERATE WBC PRESENT, PREDOMINANTLY PMN RARE GRAM NEGATIVE RODS    Culture   Final    TOO YOUNG TO READ Performed at [facility], [address] [place], [place] [address]    Report Status PENDING  Incomplete     Serology:    Imaging: If present, new imagings (plain films, ct scans, and mri) have been personally visualized and interpreted; radiology reports have been reviewed. Decision making incorporated into the Impression / Recommendations.  01/19/24 vascular u/s upper ext Pending read   3/31 mri liver 1. Multilobulated, internally septated rim enhancing  lesion within the inferior right lobe of the liver, hepatic segment V/VI measuring 4.5 x 3.7 cm. Second small component which is inseparable from this larger lesion and the liver capsule situated just inferiorly measuring 2.5 x 2.4 cm. Imaging characteristics are strongly suggestive of hepatic abscess, however a cystic metastasis could also have this appearance depending upon clinical presentation. 2. Additional small enhancing lesion in the anterior right lobe of the liver, hepatic segment VIII measuring 1.2 x 1.0 cm without internal hypoenhancement or fluid character. This is less specific, and could reflect a small phlegmon or additional metastasis. 3. No lymphadenopathy or other evidence of metastatic disease in the abdomen.  3/30 ct chest abd pelv noncontrast 1. No acute traumatic injury to the chest, abdomen or pelvis. 2. There  is a new ill-defined heterogeneous hypoattenuating approximately 4.2 x 4.8 cm mass in the right hepatic lobe, segment 5/6, which is new since the prior study and highly concerning for neoplastic process. There is an additional smaller approximately 1 cm sized hypoattenuating structure in the right hepatic lobe, segment 8, which is also concerning for neoplastic process. Further evaluation with multiphasic contrast-enhanced MRI or CT scan abdomen as per liver mass protocol is recommended. 3. There is an irregular approximately 1.4 x 1.7 cm soft tissue attenuation nodule in the right upper quadrant abutting the right inferior liver surface, favored to represent metastatic deposit. 4. There is a subpleural solid noncalcified 6 x 7 mm nodule in the left lung lower lobe, which is grossly unchanged since the prior study but new since the prior study from 12/18/2021. This is indeterminate. 5. Multiple other nonacute observations, as described above.  3/30 ct l-spine 1. No acute finding. 2. 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 3. Advanced degeneration with multilevel laminectomy.   Raymondo Band, MD [facility] [phone] pager    01/19/2024, 3:35 PM

## 2024-01-19 NOTE — Progress Notes (Addendum)
 PROGRESS NOTE    Tommy Green  WJX:[id number] DOB: September 18, 1946 DOA: 01/15/2024 PCP: [name], DO  Chief Complaint  Patient presents with   Urinary Frequency   Fall    Brief Narrative:    78 yo male with PMH malignant melanoma, DMII, RA, GERD, hx CVA, HLD who presented with weakness, recurrent falls, and some persistent dark stools.  He had fallen about 1 week prior in the bathroom and struck his right face resulting in Tiffancy Moger bruised eye as well.  On workup he was noted to have Hgb 6.5 g/dL and underwent 2 units PRBC on admission. Multiple imaging studies were obtained due to his falls and no traumatic injuries were noted. He was however noted to have Lacrystal Barbe right hepatic lobe lesion measuring 4.2 x 4.8 cm along with Makoto Sellitto left lower lobe nodule measuring 6 x 7 mm. GI was also consulted due to concern for GI bleed.  He has Avelino Herren known history of esophageal varices,  Schatzki ring, and gastric polyps on last EGD in Nov 2021.    He was admitted for ongoing GI evaluation and further workup regarding masses noted on scans.    Assessment & Plan:   Principal Problem:   GI bleed Active Problems:   Acute renal failure superimposed on stage 3b chronic kidney disease (HCC)   Liver mass   Lung nodule   Essential hypertension   Hyperlipidemia associated with type 2 diabetes mellitus (HCC)   Type 2 diabetes mellitus with diabetic neuropathy, unspecified (HCC)   S/P TAVR (transcatheter aortic valve replacement)   BPH associated with nocturia   Rheumatoid arteritis (HCC)   Heme positive stool   Abnormal CT of liver   Acute on chronic anemia  Liver Lesion Liver Abscess MRI 3/31 with multilobulated, internally septated rim enhancing lesion within the inferior right lobe of the liver, hepatic segment V/VI measuring 4.5x3.7 cm.  Second small component which is inserpatble from this larger lesion and the liver capsule situated just inferiorly measuring 2/5x2.4 cm.  Abscess vs cystic  metastasis.  Small enhancing lesion in the anterior right lobe of the liver. S/p US aspiration and core biopsy 4/2 - follow pathology Rare gram negative rods on gram stain, concerning for liver abscess (though no pus able to be aspirated yesterday) - follow final culture results Currently on abx Appreciate ID consult  GI Bleed  Anemia  Presented with Hb 6.5 on admission (was ~8-9 previously) S/p 2 units pRBC S/p EGD 4/1 -> with posterior oropharynx abnormal with congested mucosa and hematin, grade 1 esophageal varices noted proximally and in the middle of the esophagus.  No gross lesions in the distal esophagus.  Low grade narrowing schatzki ring.  5 cm hiatal hernia.  Erythematous mucosa in the stomach (biopsied).  No evidence of portal gastropathy or varices of the stomach.  Biopsies pending.  GI recommending ENT evaluation (planning for outpatient follow up - follow up placed in discharge instructions), IR evaluation of hepatic lesion (s/p biopsy).  (See report)  DVT  Upper extremity DVT on [place] - given presentation with anemia and concern for GI bleed, will discuss with GI prior to starting anticoagulation (will discuss in AM)  AKI on CKD IIIb Baseline creatinine 1.8-2.2 Current creatinine 2.57 Will trend  Mechanical Falls Therapy  Posterior oropharynx abnormal with congested mucosa and hematin  ENT planning outpatient follow up  Proximal Upstream Varicose Veins Warrants additional follow up - per GI can be seen with significant congestive venous issues, r/o SVC syndrome  or obstructive venous outflow  Upper extremity US  Lung Nodule 6x7 mm nodule in left lower lobe, warrants follow up  Rheumatoid Arthritis Enbrel chronically  E. Coli UTI On abx as above  BPH Finasteride  S/p TAVR  T2DM A1c 5.8 12/2023 SSI, basal insulin on hold  Hyperlipidemia Zetia  Hypertension Lisinopril on hold    DVT prophylaxis: SCD Code Status: full Family Communication: wife at  bedside Disposition:   Status is: Inpatient Remains inpatient appropriate because: need for continued inpatient care   Consultants:  GI IR  Procedures:  Upper endoscopy US aspiration and core biopsy   Antimicrobials:  Anti-infectives (From admission, onward)    Start     Dose/Rate Route Frequency Ordered Stop   01/20/24 1000  cefTRIAXone (ROCEPHIN) 2 g in sodium chloride 0.9 % 100 mL IVPB        2 g 200 mL/hr over 30 Minutes Intravenous Every 24 hours 01/19/24 0947     01/17/24 0845  metroNIDAZOLE (FLAGYL) IVPB 500 mg        500 mg 100 mL/hr over 60 Minutes Intravenous 2 times daily 01/17/24 0759     01/16/24 1000  cefTRIAXone (ROCEPHIN) 1 g in sodium chloride 0.9 % 100 mL IVPB        1 g 200 mL/hr over 30 Minutes Intravenous Every 24 hours 01/15/24 1600 01/19/24 1009   01/15/24 1000  cefTRIAXone (ROCEPHIN) 1 g in sodium chloride 0.9 % 100 mL IVPB        1 g 200 mL/hr over 30 Minutes Intravenous  Once 01/15/24 0951 01/15/24 1037       Subjective: No new complaints today  Objective: Vitals:   01/18/24 1517 01/18/24 1535 01/18/24 2003 01/19/24 0617  BP: (!) 168/70 (!) 176/76 (!) 151/66 (!) 160/77  Pulse: 74 79 85 92  Resp:   15 18  Temp:   98 F (36.7 C) 97.7 F (36.5 C)  TempSrc:    Oral  SpO2: 100% 100% 99% 98%  Weight:      Height:        Intake/Output Summary (Last 24 hours) at 01/19/2024 1043 Last data filed at 01/19/2024 1000 Gross per 24 hour  Intake 2346.81 ml  Output 2375 ml  Net -28.19 ml   Filed Weights   01/15/24 0924  Weight: 97 kg    Examination:  General: No acute distress. Cardiovascular: RRR Lungs: unlabored Abdomen: distended, nontender, soft Neurological: Alert and oriented 3. Moves all extremities 4 with equal strength. Cranial nerves II through XII grossly intact. Extremities: No clubbing or cyanosis. No edema.   Data Reviewed: I have personally reviewed following labs and imaging studies  CBC: Recent Labs  Lab  01/15/24 0931 01/15/24 1359 01/16/24 0425 01/16/24 1013 01/17/24 0450 01/18/24 0458 01/19/24 0444  WBC 11.2*  --  12.1*  --  14.1* 12.5* 16.2*  NEUTROABS 7.4  --   --   --  10.7* 9.2* 13.5*  HGB 6.5*   < > 8.5* 8.9* 8.2* 7.7* 7.7*  HCT 20.0*   < > 25.8* 27.9* 26.7* 24.4* 25.9*  MCV 77.5*  --  81.9  --  85.6 84.4 90.2  PLT 433*  --  418*  --  403* 343 391   < > = values in this interval not displayed.    Basic Metabolic Panel: Recent Labs  Lab 01/15/24 0931 01/16/24 0425 01/17/24 0450 01/18/24 0458 01/19/24 0444  NA 131* 131* 134* 136 136  K 4.7 3.8 3.6 3.6 3.4*  CL 102 105 108 111 110  CO2 17* 14* 15* 16* 13*  GLUCOSE 122* 122* 147* 159* 153*  BUN 49* 43* 40* 44* 43*  CREATININE 2.49* 2.47* 2.50* 2.74* 2.57*  CALCIUM 8.6* 8.8* 8.7* 8.7* 8.6*  MG  --   --  1.8 1.8 1.7    GFR: Estimated Creatinine Clearance: 27.2 mL/min (Christean Silvestri) (by C-G formula based on SCr of 2.57 mg/dL (H)).  Liver Function Tests: Recent Labs  Lab 01/16/24 1013  AST 26  ALT 22  ALKPHOS 64  BILITOT 0.7  PROT 6.9  ALBUMIN 3.0*    CBG: Recent Labs  Lab 01/18/24 0734 01/18/24 1142 01/18/24 1626 01/18/24 2006 01/19/24 0726  GLUCAP 132* 107* 117* 167* 146*     Recent Results (from the past 240 hours)  Urine Culture     Status: Abnormal   Collection Time: 01/11/24  1:33 PM   Specimen: Urine  Result Value Ref Range Status   MICRO NUMBER: [id number]  Final   SPECIMEN QUALITY: Adequate  Final   Sample Source NOT GIVEN  Final   STATUS: FINAL  Final   ISOLATE 1: Escherichia coli (Carlosdaniel Grob)  Final    Comment: Greater than 100,000 CFU/mL of Escherichia coli      Susceptibility   Escherichia coli - URINE CULTURE, REFLEX    AMOX/CLAVULANIC >=32 Resistant     AMPICILLIN >=32 Resistant     AMPICILLIN/SULBACTAM 16 Intermediate     CEFAZOLIN* >=64 Resistant      * For uncomplicated UTI caused by E. coli, K. pneumoniae or P. mirabilis: Cefazolin is susceptible if MIC <32 mcg/mL and predicts susceptible  to the oral agents cefaclor, cefdinir, cefpodoxime, cefprozil, cefuroxime, cephalexin and loracarbef.     CEFTAZIDIME <=1 Sensitive     CEFEPIME <=1 Sensitive     CEFTRIAXONE <=1 Sensitive     CIPROFLOXACIN <=0.25 Sensitive     LEVOFLOXACIN <=0.12 Sensitive     GENTAMICIN <=1 Sensitive     IMIPENEM <=0.25 Sensitive     NITROFURANTOIN <=16 Sensitive     PIP/TAZO <=4 Sensitive     TOBRAMYCIN <=1 Sensitive     TRIMETH/SULFA* <=20 Sensitive      * For uncomplicated UTI caused by E. coli, K. pneumoniae or P. mirabilis: Cefazolin is susceptible if MIC <32 mcg/mL and predicts susceptible to the oral agents cefaclor, cefdinir, cefpodoxime, cefprozil, cefuroxime, cephalexin and loracarbef. Legend: S = Susceptible  I = Intermediate R = Resistant  NS = Not susceptible SDD = Susceptible Dose Dependent * = Not Tested  NR = Not Reported **NN = See Therapy Comments   Urine Culture     Status: Abnormal   Collection Time: 01/15/24  9:31 AM   Specimen: Urine, Random  Result Value Ref Range Status   Specimen Description   Final    URINE, RANDOM Performed at [facility], [address] [place], [place] [address]    Special Requests   Final    NONE Reflexed from [id number] Performed at [facility], [address] [place], [place] [address]    Culture >=100,000 COLONIES/mL ESCHERICHIA COLI (Chesley Valls)  Final   Report Status 01/17/2024 FINAL  Final   Organism ID, Bacteria ESCHERICHIA COLI (Carols Clemence)  Final      Susceptibility   Escherichia coli - MIC*    AMPICILLIN >=32 RESISTANT Resistant     CEFAZOLIN >=64 RESISTANT Resistant     CEFEPIME <=0.12 SENSITIVE Sensitive     CEFTRIAXONE <=0.25 SENSITIVE  Sensitive     CIPROFLOXACIN <=0.25 SENSITIVE Sensitive     GENTAMICIN <=1 SENSITIVE Sensitive     IMIPENEM <=0.25 SENSITIVE Sensitive     NITROFURANTOIN <=16 SENSITIVE Sensitive     TRIMETH/SULFA <=20 SENSITIVE Sensitive     AMPICILLIN/SULBACTAM >=32 RESISTANT Resistant     PIP/TAZO  16 SENSITIVE Sensitive ug/mL    * >=100,000 COLONIES/mL ESCHERICHIA COLI  Blood culture (routine x 2)     Status: None (Preliminary result)   Collection Time: 01/15/24 10:05 AM   Specimen: BLOOD LEFT ARM  Result Value Ref Range Status   Specimen Description   Final    BLOOD LEFT ARM Performed at [facility], [address] [place], [place] [address]    Special Requests   Final    BOTTLES DRAWN AEROBIC AND ANAEROBIC Blood Culture adequate volume Performed at [facility], [address] [place], [place] [address]    Culture   Final    NO GROWTH 4 DAYS Performed at [facility], [address] [place], [place] [address]    Report Status PENDING  Incomplete  Blood culture (routine x 2)     Status: None (Preliminary result)   Collection Time: 01/15/24 10:05 AM   Specimen: BLOOD RIGHT ARM  Result Value Ref Range Status   Specimen Description   Final    BLOOD RIGHT ARM Performed at [facility], [address] [place], [place] [address]    Special Requests   Final    BOTTLES DRAWN AEROBIC AND ANAEROBIC Blood Culture adequate volume Performed at [facility], [address] [place], [place] [address]    Culture   Final    NO GROWTH 4 DAYS Performed at [facility], [address], [place], [place] [address]    Report Status PENDING  Incomplete  Aerobic/Anaerobic Culture w Gram Stain (surgical/deep wound)     Status: None (Preliminary result)   Collection Time: 01/18/24  3:07 PM   Specimen: Abscess  Result Value Ref Range Status   Specimen Description   Final    ABSCESS Performed at [facility], [address], [place], [place] [address]    Special Requests   Final    NONE Performed at [facility], [address], [place], [place] [address]    Gram Stain   Final    MODERATE WBC PRESENT, PREDOMINANTLY PMN RARE GRAM NEGATIVE RODS    Culture   Final    TOO YOUNG TO  READ Performed at [facility], [address] [place], [place] [address]    Report Status PENDING  Incomplete         Radiology Studies: US BIOPSY (LIVER) Result Date: 01/18/2024 CLINICAL DATA:  Enlarging septated cystic appearing right hepatic lobe lesion. EXAM: ULTRASOUND-GUIDED CORE AND ASPIRATION LIVER BIOPSY TECHNIQUE: An ultrasound guided liver biopsy with possible drainage was thoroughly discussed with the patient and questions were answered. The benefits, risks, alternatives, and complications were also discussed. The patient understands and wishes to proceed with the procedure. Kailia Starry verbal as well as written consent was obtained. Survey ultrasound of the liver was performed, lesion localized, and an appropriate skin entry site was determined. Skin site was marked, prepped with chlorhexidine, and draped in usual sterile fashion, and infiltrated locally with 1% lidocaine. Intravenous Fentanyl and Versed 1mg  were administered by RN during Warren Lindahl total moderate (conscious) sedation time of 10  minutes; the patient's level of consciousness and physiological / cardiorespiratory status were monitored continuously by radiology RN under my direct supervision. An 18 gauge spinal needle was advanced into the cystic appearing component. No fluid could be aspirated. The needle was removed. Subsequently, Allye Hoyos 17 gauge trocar needle was advanced under ultrasound guidance to the margin of the lesion. Multiple solid-appearing. coaxial 18gauge core samples were then obtained through the guide needle. Subsequently, Brett Darko less than 2 mL viscous opaque fluid were could be aspirated through the guide needle, which was then removed. Post procedure scans demonstrate no apparent complication. COMPLICATIONS: COMPLICATIONS None immediate FINDINGS: Septated right hepatic lobe lesion was localized corresponding to recent MR. No fluid could be aspirated, so drain catheter placement was not attempted. Ultrasound-guided 18 gauge  core biopsy samples of the lesion were obtained, sent for surgical pathology analysis. Ultrasound-guided 17 gauge aspiration viscous fluid sample from the lesion was obtained, sent for microbiology analysis. IMPRESSION: 1. Technically successful ultrasound guided core and aspiration liver lesion biopsy. Electronically Signed   By: Corlis Leak M.D.   On: 01/18/2024 18:52   [place] FNA SOFT TISSUE Result Date: 01/18/2024 CLINICAL DATA:  Enlarging septated cystic appearing right hepatic lobe lesion. EXAM: ULTRASOUND-GUIDED CORE AND ASPIRATION LIVER BIOPSY TECHNIQUE: An ultrasound guided liver biopsy with possible drainage was thoroughly discussed with the patient and questions were answered. The benefits, risks, alternatives, and complications were also discussed. The patient understands and wishes to proceed with the procedure. Tong Pieczynski verbal as well as written consent was obtained. Survey ultrasound of the liver was performed, lesion localized, and an appropriate skin entry site was determined. Skin site was marked, prepped with chlorhexidine, and draped in usual sterile fashion, and infiltrated locally with 1% lidocaine. Intravenous Fentanyl and Versed 1mg  were administered by RN during Deyja Sochacki total moderate (conscious) sedation time of 10 minutes; the patient's level of consciousness and physiological / cardiorespiratory status were monitored continuously by radiology RN under my direct supervision. An 18 gauge spinal needle was advanced into the cystic appearing component. No fluid could be aspirated. The needle was removed. Subsequently, Shriley Joffe 17 gauge trocar needle was advanced under ultrasound guidance to the margin of the lesion. Multiple solid-appearing. coaxial 18gauge core samples were then obtained through the guide needle. Subsequently, Emauri Krygier less than 2 mL viscous opaque fluid were could be aspirated through the guide needle, which was then removed. Post procedure scans demonstrate no apparent complication.  COMPLICATIONS: COMPLICATIONS None immediate FINDINGS: Septated right hepatic lobe lesion was localized corresponding to recent MR. No fluid could be aspirated, so drain catheter placement was not attempted. Ultrasound-guided 18 gauge core biopsy samples of the lesion were obtained, sent for surgical pathology analysis. Ultrasound-guided 17 gauge aspiration viscous fluid sample from the lesion was obtained, sent for microbiology analysis. IMPRESSION: 1. Technically successful ultrasound guided core and aspiration liver lesion biopsy. Electronically Signed   By: Corlis Leak M.D.   On: 01/18/2024 18:52        Scheduled Meds:  escitalopram  20 mg Oral Daily   ezetimibe  10 mg Oral QPM   fenofibrate  54 mg Oral Daily   finasteride  5 mg Oral Daily   insulin aspart  0-15 Units Subcutaneous TID WC   insulin aspart  0-5 Units Subcutaneous QHS   melatonin  5 mg Oral QHS   pantoprazole  40 mg Oral Daily   QUEtiapine  25 mg Oral QHS   Continuous Infusions:  [START ON 01/20/2024] cefTRIAXone (ROCEPHIN)  IV  lactated ringers 100 mL/hr at 01/18/24 1623   metronidazole 500 mg (01/18/24 2114)     LOS: 4 days    Time spent: over 30 min    Lacretia Nicks, MD [facility]   To contact the attending provider between 7A-7P or the covering provider during after hours 7P-7A, please log into the web site [email or web address] and access using universal [place] password for that web site. If you do not have the password, please call the hospital operator.  01/19/2024, 10:43 AM

## 2024-01-19 NOTE — Evaluation (Signed)
 Occupational Therapy Evaluation Patient Details Name: Tommy Green MRN: [id number] DOB: 1946/08/17 Today's Date: 01/19/2024   History of Present Illness   78 yo with progressive decline in subjective health; GI bleed (Hgb 6.1 on admission) and recent fall. Imaging revealed liver with lesions concerning for liver mets - workup underway.  PMH: AS s/p tavr 01/2022, RA, gerd, dm2, cva, malignant melanoma, iron deficiency anemia, esophageal varices/portal hypertensive gastropathy, recent possible uti.     Clinical Impressions PTA pt lives with wife who assists with LB ADL tasks as needed, however pt modified independent with use of a RW.  Pt and wife report multiple falls within the last 2 weeks, requiring lifting assistance from the local FD. Pt reports generalized weakness and complaining of abdominal pain, however cooperative and able to progress OOB to recliner with Mod A. Overall Max to total A with LB ADL due to deficits listed below. Patient will benefit from continued inpatient follow up therapy, <3 hours/day to maximize functional level of independence with goal to return home with assistance of wife. Acute OT to follow.      If plan is discharge home, recommend the following:   A lot of help with walking and/or transfers;A lot of help with bathing/dressing/bathroom;Assistance with cooking/housework;Direct supervision/assist for medications management;Direct supervision/assist for financial management;Assist for transportation;Help with stairs or ramp for entrance     Functional Status Assessment   Patient has had a recent decline in their functional status and demonstrates the ability to make significant improvements in function in a reasonable and predictable amount of time.     Equipment Recommendations   Wheelchair (measurements OT);Wheelchair cushion (measurements OT);BSC/3in1 (wide BSC)     Recommendations for Other Services         Precautions/Restrictions    Precautions Precautions: Fall     Mobility Bed Mobility Overal bed mobility: Needs Assistance Bed Mobility: Supine to Sit     Supine to sit: Mod assist     General bed mobility comments: physical assistnace to progress to EOB; initial posterior lean    Transfers Overall transfer level: Needs assistance Equipment used: Rolling walker (2 wheels) Transfers: Sit to/from Stand, Bed to chair/wheelchair/BSC Sit to Stand: Min assist, +2 safety/equipment     Step pivot transfers: Mod assist, From elevated surface            Balance Overall balance assessment: History of Falls, Needs assistance Sitting-balance support: Feet supported Sitting balance-Leahy Scale: Fair       Standing balance-Leahy Scale: Poor                             ADL either performed or assessed with clinical judgement   ADL Overall ADL's : Needs assistance/impaired Eating/Feeding: Independent   Grooming: Set up;Sitting   Upper Body Bathing: Minimal assistance;Sitting   Lower Body Bathing: Maximal assistance;Sit to/from stand   Upper Body Dressing : Minimal assistance;Sitting   Lower Body Dressing: Maximal assistance;Sit to/from stand   Toilet Transfer: Moderate assistance;Stand-pivot;Rolling walker (2 wheels)   Toileting- Clothing Manipulation and Hygiene: Total assistance (incontinent of BM)       Functional mobility during ADLs: Moderate assistance;Rolling walker (2 wheels);Cueing for safety       Vision Baseline Vision/History: 1 Wears glasses       Perception         Praxis         Pertinent Vitals/Pain Pain Assessment Pain Assessment: Faces Pain Score: 4  Faces  Pain Scale: Hurts little more Pain Location: "gut" and when he "pees" Pain Descriptors / Indicators: Discomfort Pain Intervention(s): Limited activity within patient's tolerance     Extremity/Trunk Assessment Upper Extremity Assessment Upper Extremity Assessment: Right hand dominant;Generalized  weakness LUE Deficits / Details: "sore" lateral part of upper arm however ROM WFL   Lower Extremity Assessment Lower Extremity Assessment: Defer to PT evaluation (L footdrop)   Cervical / Trunk Assessment Cervical / Trunk Assessment: Other exceptions;Kyphotic (hx of back surgery with L foot drop; forward head)   Communication Communication Communication: No apparent difficulties   Cognition Arousal: Alert Behavior During Therapy: WFL for tasks assessed/performed Cognition: Cognition impaired   Orientation impairments: Place, Time, Situation Awareness: Intellectual awareness impaired       OT - Cognition Comments: cognition impaired at baseline however wife states he is more confused than his "normal confusion"                 Following commands: Intact       Cueing  General Comments      Pt with copmlaints of dizziness. BP sitting155/99; after returning to sitting from standing 124/54 - unsure of accuracy as difficulty with BP cuff; dizziness continued to improve and pt trnasferred to chair   Exercises     Shoulder Instructions      Home Living Family/patient expects to be discharged to:: Private residence Living Arrangements: Spouse/significant other Available Help at Discharge: Family;Available 24 hours/day Type of Home: House Home Access: Stairs to enter [employer] of Steps: 3 Entrance Stairs-Rails: Right Home Layout: One level     Bathroom Shower/Tub: Tub/shower unit;Curtain   Bathroom Toilet: Handicapped height Bathroom Accessibility: Yes How Accessible: Accessible via walker Home Equipment: Rolling Walker (2 wheels);BSC/3in1;Tub bench;Grab bars - tub/shower;Lift chair          Prior Functioning/Environment Prior Level of Function : Needs assist;History of Falls (last six months)       Physical Assist : ADLs (physical)   ADLs (physical): Bathing;Dressing;IADLs Mobility Comments: RW for gait      OT Problem List: Decreased  strength;Decreased activity tolerance;Impaired balance (sitting and/or standing);Decreased cognition;Decreased safety awareness;Decreased knowledge of use of DME or AE;Pain   OT Treatment/Interventions: Self-care/ADL training;Therapeutic exercise;DME and/or AE instruction;Therapeutic activities;Cognitive remediation/compensation;Patient/family education;Balance training      OT Goals(Current goals can be found in the care plan section)   Acute Rehab OT Goals Patient Stated Goal: per wife to get some rehab OT Goal Formulation: With patient/family Time For Goal Achievement: 02/02/24 Potential to Achieve Goals: Good   OT Frequency:  Min 1X/week    Co-evaluation              AM-PAC OT "6 Clicks" Daily Activity     Outcome Measure Help from another person eating meals?: None Help from another person taking care of personal grooming?: A Little Help from another person toileting, which includes using toliet, bedpan, or urinal?: Total Help from another person bathing (including washing, rinsing, drying)?: A Lot Help from another person to put on and taking off regular upper body clothing?: A Little Help from another person to put on and taking off regular lower body clothing?: A Lot 6 Click Score: 15   End of Session Equipment Utilized During Treatment: Gait belt;Rolling walker (2 wheels) Nurse Communication: Mobility status  Activity Tolerance: Patient tolerated treatment well Patient left: in chair;with call bell/phone within reach  OT Visit Diagnosis: Unsteadiness on feet (R26.81);Repeated falls (R29.6);Muscle weakness (generalized) (M62.81);Other symptoms and signs involving cognitive  function;Pain Pain - part of body:  (generalized adn abdomen)                Time: 1610-9604 OT Time Calculation (min): 35 min Charges:  OT General Charges $OT Visit: 1 Visit OT Evaluation $OT Eval Moderate Complexity: 1 Mod OT Treatments $Self Care/Home Management : 8-22 mins  Luisa Dago, OT/L   Acute OT Clinical Specialist Acute Rehabilitation Services Pager [phone] Office [phone]   [facility] 01/19/2024, 6:56 PM

## 2024-01-19 NOTE — Plan of Care (Signed)
  Problem: Education: Goal: Knowledge of General Education information will improve Description: Including pain rating scale, medication(s)/side effects and non-pharmacologic comfort measures Outcome: Progressing   Problem: Health Behavior/Discharge Planning: Goal: Ability to manage health-related needs will improve Outcome: Not Progressing   Problem: Clinical Measurements: Goal: Ability to maintain clinical measurements within normal limits will improve Outcome: Not Progressing

## 2024-01-20 ENCOUNTER — Inpatient Hospital Stay (HOSPITAL_COMMUNITY)

## 2024-01-20 ENCOUNTER — Ambulatory Visit: Admitting: Rheumatology

## 2024-01-20 DIAGNOSIS — I10 Essential (primary) hypertension: Secondary | ICD-10-CM

## 2024-01-20 DIAGNOSIS — M24521 Contracture, right elbow: Secondary | ICD-10-CM

## 2024-01-20 DIAGNOSIS — Z952 Presence of prosthetic heart valve: Secondary | ICD-10-CM | POA: Diagnosis not present

## 2024-01-20 DIAGNOSIS — K750 Abscess of liver: Secondary | ICD-10-CM | POA: Diagnosis not present

## 2024-01-20 DIAGNOSIS — D649 Anemia, unspecified: Secondary | ICD-10-CM | POA: Diagnosis not present

## 2024-01-20 DIAGNOSIS — E1169 Type 2 diabetes mellitus with other specified complication: Secondary | ICD-10-CM

## 2024-01-20 DIAGNOSIS — Z8673 Personal history of transient ischemic attack (TIA), and cerebral infarction without residual deficits: Secondary | ICD-10-CM

## 2024-01-20 DIAGNOSIS — M160 Bilateral primary osteoarthritis of hip: Secondary | ICD-10-CM

## 2024-01-20 DIAGNOSIS — M25572 Pain in left ankle and joints of left foot: Secondary | ICD-10-CM

## 2024-01-20 DIAGNOSIS — Z79899 Other long term (current) drug therapy: Secondary | ICD-10-CM

## 2024-01-20 DIAGNOSIS — M47816 Spondylosis without myelopathy or radiculopathy, lumbar region: Secondary | ICD-10-CM

## 2024-01-20 DIAGNOSIS — Z794 Long term (current) use of insulin: Secondary | ICD-10-CM

## 2024-01-20 DIAGNOSIS — L920 Granuloma annulare: Secondary | ICD-10-CM

## 2024-01-20 DIAGNOSIS — S065XAA Traumatic subdural hemorrhage with loss of consciousness status unknown, initial encounter: Secondary | ICD-10-CM

## 2024-01-20 DIAGNOSIS — G8929 Other chronic pain: Secondary | ICD-10-CM

## 2024-01-20 DIAGNOSIS — R011 Cardiac murmur, unspecified: Secondary | ICD-10-CM

## 2024-01-20 DIAGNOSIS — D049 Carcinoma in situ of skin, unspecified: Secondary | ICD-10-CM

## 2024-01-20 DIAGNOSIS — M0630 Rheumatoid nodule, unspecified site: Secondary | ICD-10-CM

## 2024-01-20 DIAGNOSIS — M0579 Rheumatoid arthritis with rheumatoid factor of multiple sites without organ or systems involvement: Secondary | ICD-10-CM

## 2024-01-20 DIAGNOSIS — M1712 Unilateral primary osteoarthritis, left knee: Secondary | ICD-10-CM

## 2024-01-20 LAB — BASIC METABOLIC PANEL WITH GFR
Anion gap: 11 (ref 5–15)
BUN: 45 mg/dL — ABNORMAL HIGH (ref 8–23)
CO2: 15 mmol/L — ABNORMAL LOW (ref 22–32)
Calcium: 8.3 mg/dL — ABNORMAL LOW (ref 8.9–10.3)
Chloride: 107 mmol/L (ref 98–111)
Creatinine, Ser: 2.74 mg/dL — ABNORMAL HIGH (ref 0.61–1.24)
GFR, Estimated: 23 mL/min — ABNORMAL LOW (ref >60)
Glucose, Bld: 157 mg/dL — ABNORMAL HIGH (ref 70–99)
Potassium: 3.3 mmol/L — ABNORMAL LOW (ref 3.5–5.1)
Sodium: 133 mmol/L — ABNORMAL LOW (ref 135–145)

## 2024-01-20 LAB — GLUCOSE, CAPILLARY
Glucose-Capillary: 138 mg/dL — ABNORMAL HIGH (ref 70–99)
Glucose-Capillary: 151 mg/dL — ABNORMAL HIGH (ref 70–99)
Glucose-Capillary: 128 mg/dL — ABNORMAL HIGH (ref 70–99)
Glucose-Capillary: 166 mg/dL — ABNORMAL HIGH (ref 70–99)

## 2024-01-20 LAB — ECHOCARDIOGRAM COMPLETE
AR max vel: 2.54 cm2
AV Area VTI: 2.73 cm2
AV Area mean vel: 2.37 cm2
AV Mean grad: 10 mmHg
AV Peak grad: 16.6 mmHg
Ao pk vel: 2.04 m/s
Area-P 1/2: 4.41 cm2
Calc EF: 55.3 %
Height: 71 in
MV VTI: 2.97 cm2
S' Lateral: 3.9 cm
Single Plane A2C EF: 53.9 %
Single Plane A4C EF: 57.3 %
Weight: 3420.8 [oz_av]

## 2024-01-20 LAB — CBC WITH DIFFERENTIAL/PLATELET
Abs Immature Granulocytes: 0.09 10*3/uL — ABNORMAL HIGH (ref 0.00–0.07)
Basophils Absolute: 0 10*3/uL (ref 0.0–0.1)
Basophils Relative: 0 %
Eosinophils Absolute: 0.2 10*3/uL (ref 0.0–0.5)
Eosinophils Relative: 1 %
HCT: 23.7 % — ABNORMAL LOW (ref 39.0–52.0)
Hemoglobin: 7.6 g/dL — ABNORMAL LOW (ref 13.0–17.0)
Immature Granulocytes: 1 %
Lymphocytes Relative: 15 %
Lymphs Abs: 2 10*3/uL (ref 0.7–4.0)
MCH: 27 pg (ref 26.0–34.0)
MCHC: 32.1 g/dL (ref 30.0–36.0)
MCV: 84.3 fL (ref 80.0–100.0)
Monocytes Absolute: 0.7 10*3/uL (ref 0.1–1.0)
Monocytes Relative: 6 %
Neutro Abs: 10 10*3/uL — ABNORMAL HIGH (ref 1.7–7.7)
Neutrophils Relative %: 77 %
Platelets: 329 10*3/uL (ref 150–400)
RBC: 2.81 MIL/uL — ABNORMAL LOW (ref 4.22–5.81)
RDW: 17.6 % — ABNORMAL HIGH (ref 11.5–15.5)
WBC: 12.9 10*3/uL — ABNORMAL HIGH (ref 4.0–10.5)
nRBC: 0 % (ref 0.0–0.2)

## 2024-01-20 LAB — IRON AND TIBC
Iron: 10 ug/dL — ABNORMAL LOW (ref 45–182)
Saturation Ratios: 4 % — ABNORMAL LOW (ref 17.9–39.5)
TIBC: 274 ug/dL (ref 250–450)
UIBC: 264 ug/dL

## 2024-01-20 LAB — MAGNESIUM: Magnesium: 1.7 mg/dL (ref 1.7–2.4)

## 2024-01-20 LAB — CULTURE, BLOOD (ROUTINE X 2)
Culture: NO GROWTH
Culture: NO GROWTH
Special Requests: ADEQUATE
Special Requests: ADEQUATE

## 2024-01-20 LAB — HEPARIN LEVEL (UNFRACTIONATED): Heparin Unfractionated: <0.1 [IU]/mL — ABNORMAL LOW (ref 0.30–0.70)

## 2024-01-20 LAB — FERRITIN: Ferritin: 123 ng/mL (ref 24–336)

## 2024-01-20 LAB — VITAMIN B12: Vitamin B-12: 6627 pg/mL — ABNORMAL HIGH (ref 180–914)

## 2024-01-20 LAB — PREPARE RBC (CROSSMATCH)

## 2024-01-20 LAB — FOLATE: Folate: 27.1 ng/mL (ref >5.9)

## 2024-01-20 MED ORDER — PROMETHAZINE HCL 25 MG PO TABS: 25.0000 mg | ORAL_TABLET | Freq: Four times a day (QID) | ORAL | Status: DC | PRN

## 2024-01-20 MED ORDER — PROMETHAZINE (PHENERGAN) 6.25MG IN NS 50ML IVPB
6.2500 mg | Freq: Four times a day (QID) | INTRAVENOUS | Status: DC | PRN
Start: 2024-01-20 — End: 2024-01-26
  Administered 2024-01-20: 6.25 mg via INTRAVENOUS
  Filled 2024-01-20: qty 6.25

## 2024-01-20 MED ORDER — HEPARIN (PORCINE) 25000 UT/250ML-% IV SOLN
2000.0000 [IU]/h | INTRAVENOUS | Status: DC
  Administered 2024-01-20: 1600 [IU]/h via INTRAVENOUS
  Administered 2024-01-21: 2000 [IU]/h via INTRAVENOUS
  Administered 2024-01-21: 1850 [IU]/h via INTRAVENOUS
  Administered 2024-01-22 – 2024-01-24 (×5): 2000 [IU]/h via INTRAVENOUS
  Filled 2024-01-20 (×8): qty 250

## 2024-01-20 MED ORDER — SODIUM CHLORIDE 0.9% IV SOLUTION: Freq: Once | INTRAVENOUS | Status: DC

## 2024-01-20 MED ORDER — METRONIDAZOLE 500 MG PO TABS
500.0000 mg | ORAL_TABLET | Freq: Two times a day (BID) | ORAL | Status: DC
  Administered 2024-01-20 – 2024-01-21 (×3): 500 mg via ORAL
  Filled 2024-01-20 (×3): qty 1

## 2024-01-20 MED ORDER — PROMETHAZINE HCL 25 MG RE SUPP: 25.0000 mg | Freq: Four times a day (QID) | RECTAL | Status: DC | PRN

## 2024-01-20 NOTE — Progress Notes (Addendum)
 PHARMACY - ANTICOAGULATION CONSULT NOTE  Pharmacy Consult for IV heparin  Indication: VYE  Allergies  Allergen Reactions   Canagliflozin Other (See Comments)    Pt reports that the use of Invokana caused acute KIDNEY FAILURE ([facility] records) and "allergic," per [facility]   Aspirin Other (See Comments)    Gastroesophageal reflux and "allergic," per [facility]    Atorvastatin Other (See Comments)    Myalgias, GI upset, and "allergic," per [facility]   Statins Other (See Comments)    Muscle pain- "allergic," per [facility]    Patient Measurements: Height: 5\' 11"  (180.3 cm) Weight: 97 kg (213 lb 12.8 oz) IBW/kg (Calculated) : 75.3 HEPARIN DW (KG): 95  Vital Signs: Temp: 97.9 F (36.6 C) (04/04 2018) Temp Source: Oral (04/04 1640) BP: 181/90 (04/04 2018) Pulse Rate: 79 (04/04 2018)  Labs: Recent Labs    01/18/24 0458 01/19/24 0444 01/20/24 0443 01/20/24 2110  HGB 7.7* 7.7* 7.6*  --   HCT 24.4* 25.9* 23.7*  --   PLT 343 391 329  --   HEPARINUNFRC  --   --   --  <0.10*  CREATININE 2.74* 2.57* 2.74*  --     Estimated Creatinine Clearance: 25.5 mL/min (A) (by C-G formula based on SCr of 2.74 mg/dL (H)).   Medical History: Past Medical History:  Diagnosis Date   Anemia    Essential hypertension 07/18/2017   Foot drop, left foot    due to back surgery in 01/2021   GERD (gastroesophageal reflux disease)    History of colonic polyps    History of hiatal hernia    many years ago   History of rheumatic fever    History of stroke 07/18/2017   Hypertriglyceridemia 07/18/2017   S/P TAVR (transcatheter aortic valve replacement) 01/26/2022   [name] 29mm via TF approach with Dr. Lynnette Caffey and Dr. Laneta Simmers   Severe aortic stenosis    Type 2 diabetes mellitus with diabetic neuropathy, unspecified (HCC) 07/18/2017    Medications:  Medications Prior to Admission  Medication Sig Dispense Refill Last Dose/Taking   acetaminophen (TYLENOL) 325 MG tablet Take 650 mg by mouth every 6 (six) hours as  needed for mild pain or headache.   01/15/2024 Morning   aspirin EC 81 MG tablet Take 1 tablet (81 mg total) by mouth daily. Swallow whole. 90 tablet 3 01/14/2024 Morning   B Complex Vitamins (B COMPLEX PO) Take 1 tablet by mouth daily.   01/14/2024 Morning   Cholecalciferol (VITAMIN D3 SUPER STRENGTH) 50 MCG (2000 UT) CAPS Take 2,000 Units by mouth daily.   01/14/2024 Morning   escitalopram (LEXAPRO) 20 MG tablet Take 1 tablet (20 mg total) by mouth daily. 90 tablet 2 01/14/2024 Evening   etanercept (ENBREL MINI) 50 MG/ML injection Inject 1 mL (50 mg total) into the skin once a week. 12 mL 0 01/09/2024   ezetimibe (ZETIA) 10 MG tablet Take 1 tablet (10 mg total) by mouth daily. 90 tablet 3 01/14/2024 Evening   fenofibrate micronized (LOFIBRA) 200 MG capsule TAKE 1 CAPSULE DAILY BEFOREBREAKFAST 90 capsule 3 01/14/2024 Morning   finasteride (PROSCAR) 5 MG tablet Take 1 tablet (5 mg total) by mouth daily. 90 tablet 3 01/11/2024   HYDROcodone-acetaminophen (NORCO) 7.5-325 MG tablet Take 1 tablet by mouth every 6 (six) hours as needed for moderate pain (pain score 4-6). 30 tablet 0 01/14/2024 Bedtime   insulin aspart (NOVOLOG FLEXPEN) 100 UNIT/ML FlexPen Inject 0-20 Units into the skin See admin instructions. Inject 0-10 units into the skin  before meals, PER SLIDING SCALE: BGL 0-200 = give nothing, 201-250 = 2 units, 251-300 = 4 units, 301-350 = 6 units, 351-400 = 8 units, >400 = 10 units, push fluids, and repeat BGL check in 2 hours. If BGL remains >400 after 2 hours, CALL MD.   01/14/2024 Bedtime   insulin degludec (TRESIBA FLEXTOUCH) 100 UNIT/ML FlexTouch Pen Inject 20 Units into the skin daily.   01/14/2024 Bedtime   leflunomide (ARAVA) 10 MG tablet TAKE 1 TABLET DAILY 90 tablet 0 01/14/2024 Morning   lisinopril (ZESTRIL) 40 MG tablet Take 1 tablet (40 mg total) by mouth in the morning. 90 tablet 3 01/14/2024 Morning   magnesium oxide (MAG-OX) 400 MG tablet Take 400 mg by mouth daily with lunch.   01/14/2024 Evening    melatonin 5 MG TABS Take 5 mg by mouth at bedtime.   01/14/2024 Bedtime   Multiple Vitamins-Minerals (MULTIVITAMIN WITH MINERALS) tablet Take 1 tablet by mouth daily with breakfast.   01/14/2024 Morning   Omega 3 1000 MG CAPS Take 2,000 mg by mouth daily with lunch.   01/14/2024 Morning   omeprazole (PRILOSEC) 20 MG capsule Take 1 capsule (20 mg total) by mouth daily. 90 capsule 3 01/14/2024 Morning   Potassium 99 MG TABS Take 99 mg by mouth every evening.   01/14/2024 Noon   vitamin B-12 (CYANOCOBALAMIN) 1000 MCG tablet Take 1,000 mcg by mouth daily.   01/14/2024 Morning   vitamin E 180 MG (400 UNITS) capsule Take 400 Units by mouth daily.   01/14/2024 Morning   amoxicillin (AMOXIL) 500 MG tablet Take 4 tablets (2,000 mg total) by mouth as directed. 1 HOUR PRIOR TO DENTAL APPOINTMENTS (Patient not taking: Reported on 01/15/2024) 12 tablet 6 Not Taking   cefdinir (OMNICEF) 300 MG capsule Take 1 capsule (300 mg total) by mouth 2 (two) times daily for 7 days. 14 capsule 0    fluticasone (FLONASE) 50 MCG/ACT nasal spray Place 2 sprays into both nostrils daily. (Patient not taking: Reported on 01/15/2024) 16 g 6 Not Taking   HYDROcodone-acetaminophen (NORCO) 7.5-325 MG tablet Take 1 tablet by mouth every 6 (six) hours as needed for moderate pain (pain score 4-6). (Patient not taking: Reported on 01/15/2024) 30 tablet 0 Not Taking   HYDROcodone-acetaminophen (NORCO) 7.5-325 MG tablet Take 1 tablet by mouth every 6 (six) hours as needed for moderate pain (pain score 4-6). (Patient not taking: Reported on 01/15/2024) 30 tablet 0 Not Taking   ONETOUCH VERIO test strip 1 each by Other route as needed for other (Glucose).       Assessment: Pharmacy is consulted to start IV heparin in 78 yo male diagnosed with acute DVT involving the right radial veins and acute superficial vein thrombosis involving the right basilic vein and right cephalic vein. Pt not on anticoagulation PTA . There has also been concerns for GI  bleed.   Per consult, no bolus due to anemia.   Today, 01/20/24 Hgb 7.6 low but stable Plt 329  Scr 2.74 mg/dl, CrCl 25 ml/min   2841 HL < 0.1 subtherapeutic on 1600 units/hr No bleeding or interruptions per RN   Goal of Therapy:  Heparin level 0.3-0.7 units/ml Monitor platelets by anticoagulation protocol: Yes   Plan:  Increase heparin drip to 1850 units/hr Obtain HL 8 hours after start of infusion Daily CBC, HL while on heparin drip Monitor for signs and symptoms of bleeding    Arley Phenix RPh 01/20/2024, 10:00 PM

## 2024-01-20 NOTE — Plan of Care (Signed)
 Progress:  Patient BP has been elevated throughout shift MD notified each time. Blood given. Heparin started this shift.  Problem: Education: Goal: Knowledge of General Education information will improve Description: Including pain rating scale, medication(s)/side effects and non-pharmacologic comfort measures Outcome: Progressing   Problem: Health Behavior/Discharge Planning: Goal: Ability to manage health-related needs will improve Outcome: Progressing   Problem: Clinical Measurements: Goal: Ability to maintain clinical measurements within normal limits will improve Outcome: Progressing Goal: Diagnostic test results will improve Outcome: Progressing   Problem: Nutrition: Goal: Adequate nutrition will be maintained Outcome: Progressing   Problem: Pain Managment: Goal: General experience of comfort will improve and/or be controlled Outcome: Progressing   Problem: Safety: Goal: Ability to remain free from injury will improve Outcome: Progressing

## 2024-01-20 NOTE — Progress Notes (Signed)
 PT Cancellation Note  Patient Details Name: Tommy Green MRN: [id number] DOB: 25-Oct-1945   Cancelled Treatment:    Reason Eval/Treat Not Completed: Medical issues which prohibited therapy (pt remains nauseous, he doesn't feel he can tolerate activity at present. Noted RUE DVT 01/19/24, heparin started today at 13:34.  Will follow.)   Tamala Ser PT 01/20/2024  Acute Rehabilitation Services  Office [phone]

## 2024-01-20 NOTE — Progress Notes (Signed)
  Echocardiogram 2D Echocardiogram has been performed.  Tommy Green Tommy Green 01/20/2024, 3:09 PM

## 2024-01-20 NOTE — Progress Notes (Signed)
CBG 166

## 2024-01-20 NOTE — Progress Notes (Signed)
 PT Cancellation Note  Patient Details Name: Tommy Green MRN: [id number] DOB: [birth year]   Cancelled Treatment:    Reason Eval/Treat Not Completed: Pain limiting ability to participate (pt reports he's not able to tolerate activity at present due to stomach pain. RN notified. Will follow.)   Tamala Ser PT 01/20/2024  Acute Rehabilitation Services  Office [phone]

## 2024-01-20 NOTE — Progress Notes (Signed)
 [facility]  Date of Admission:  01/15/2024     Lines:  Peripheral iv's   Abx: Ceftriaxone flagyl                                                        Assessment:  78 y.o. male former smoker, AS s/p tavr 01/2022, RA, hx gerd, dm2, hx cva, malignant melanoma, iron deficiency anemia, hx esophageal varices/portal hypertensive gastropathy (negative celiac disease on duodenal biopsy in 2021), recent possible uti, admitted for months to a year of progressive decline in subjective health, found to have severe anemia hb 6.1, and imaging of liver abscess vs metastasis     Hx melanoma, and progressive physical health but have tavr as well. Imaging with lesions concerning for liver mets which might be just infection or superimposed infection. ?possible dvt unprovoked upper ext also suggestive of cancer if confirmed dvt   No obvious sign of chf but gi did mention portal hypertensive changes ?from cardiac. Consider repeat echo. Tavr failure/restenosis could present with fatigue/chf sx as well   Path liver and duodenal pending   4/2 liver lesion cx GNR   Probably if available, an oral regimen of 4 weeks could be planned.    ------------- 01/20/24 id assessment Duplex showed dvt upper and lower ext 4/1 gastric/duodenal bx A. STOMACH, BIOPSY:  -  Predominantly oxyntic type mucosa with reactive/reparative change and  mild chronic inactive gastritis with focal crystalline material  suggestive of iron pill gastritis.  -  A Prussian blue stain shows focal weak positivity; however, the area  of interest is predominantly lost on levels  -  A calcium stain is negative.  -  An immunohistochemical stain for Helicobacter pylori organisms is  negative.   B. DUODENUM, BIOPSY:  -  Duodenal mucosa with prominent Brunner's glands and foveolar  metaplasia consistent with chronic peptic duodenitis.     4/2 liver biopsy A. LIVER, RIGHT, BIOPSY:  -  Liver with  reactive fibrosis and acute inflammation consistent with  the clinical impression of abscess.     4/2 liver biopsy cx ecoli pendign susceptibility  Clinically he still feels poorly but suspect at least 2 more weeks before he is feeling better.    Malignancy in setting dvt and chronic ftt still remains on ddx and will need further monitoring/screening outpatient     Plan: Continue ceftriaxone/flagyl for now F/u culture which should be available tomorrow or Sunday and change to an appropriate oral abx Please provide 4 weeks abx with 1 refill and we can determine in id clinic if we can stop early Maintain standard isolation precaution ID clinic 4/22 @ 1015 Discussed with primary team     Principal Problem:   GI bleed Active Problems:   Essential hypertension   Hyperlipidemia associated with type 2 diabetes mellitus (HCC)   Type 2 diabetes mellitus with diabetic neuropathy, unspecified (HCC)   Acute renal failure superimposed on stage 3b chronic kidney disease (HCC)   S/P TAVR (transcatheter aortic valve replacement)   BPH associated with nocturia   Liver mass   Rheumatoid arteritis (HCC)   Heme positive stool   Abnormal CT of liver   Acute on chronic anemia   Lung nodule   Hepatic abscess   Allergies  Allergen  Reactions   Canagliflozin Other (See Comments)    Pt reports that the use of Invokana caused acute KIDNEY FAILURE ([facility] records) and "allergic," per [facility]   Aspirin Other (See Comments)    Gastroesophageal reflux and "allergic," per [facility]    Atorvastatin Other (See Comments)    Myalgias, GI upset, and "allergic," per [facility]   Statins Other (See Comments)    Muscle pain- "allergic," per [facility]    Scheduled Meds:  sodium chloride   Intravenous Once   escitalopram  20 mg Oral Daily   ezetimibe  10 mg Oral QPM   fenofibrate  54 mg Oral Daily   finasteride  5 mg Oral Daily   insulin aspart  0-15 Units Subcutaneous TID WC   insulin aspart  0-5 Units Subcutaneous QHS    melatonin  5 mg Oral QHS   metroNIDAZOLE  500 mg Oral Q12H   pantoprazole  40 mg Oral Daily   QUEtiapine  25 mg Oral QHS   Continuous Infusions:  cefTRIAXone (ROCEPHIN)  IV 2 g (01/20/24 0959)   heparin 1,600 Units/hr (01/20/24 1334)   promethazine (PHENERGAN) injection (IM or IVPB) 6.25 mg (01/20/24 1157)   PRN Meds:.acetaminophen **OR** acetaminophen, albuterol, fluticasone, ondansetron **OR** ondansetron (ZOFRAN) IV, promethazine **OR** promethazine (PHENERGAN) injection (IM or IVPB) **OR** promethazine   SUBJECTIVE: Feels not much better Pathology reports discussed  Stool loose but no frank diarrhea No focal pain No f/c No rash n/v   Review of Systems: ROS All other ROS was negative, except mentioned above     OBJECTIVE: Vitals:   01/20/24 0450 01/20/24 1243 01/20/24 1258 01/20/24 1300  BP: (!) 180/85 (!) 187/83 (!) 182/84 (!) 182/84  Pulse: 86 79 80 80  Resp: 16 (!) 21 19   Temp: 98 F (36.7 C) 98.3 F (36.8 C) 98.3 F (36.8 C) 98.2 F (36.8 C)  TempSrc: Oral Oral Oral Oral  SpO2: 98% 100% 99% 98%  Weight:      Height:       Body mass index is 29.82 kg/m.  Physical Exam General/constitutional: mildly lethargic, conversant, cooperative HEENT: Normocephalic, PER, Conj Clear, EOMI, Oropharynx clear CV: rrr no mrg Lungs: clear to auscultation, normal respiratory effort Abd: Soft, Nontender Ext: no edema Skin: No Rash Neuro: nonfocal -- generalized weakness MSK: no peripheral joint swelling/tenderness/warmth  Lab Results Lab Results  Component Value Date   WBC 12.9 (H) 01/20/2024   HGB 7.6 (L) 01/20/2024   HCT 23.7 (L) 01/20/2024   MCV 84.3 01/20/2024   PLT 329 01/20/2024    Lab Results  Component Value Date   CREATININE 2.74 (H) 01/20/2024   BUN 45 (H) 01/20/2024   NA 133 (L) 01/20/2024   K 3.3 (L) 01/20/2024   CL 107 01/20/2024   CO2 15 (L) 01/20/2024    Lab Results  Component Value Date   ALT 22 01/16/2024   AST 26 01/16/2024    ALKPHOS 64 01/16/2024   BILITOT 0.7 01/16/2024      Microbiology: Recent Results (from the past 240 hours)  Urine Culture     Status: Abnormal   Collection Time: 01/11/24  1:33 PM   Specimen: Urine  Result Value Ref Range Status   MICRO NUMBER: [id number]  Final   SPECIMEN QUALITY: Adequate  Final   Sample Source NOT GIVEN  Final   STATUS: FINAL  Final   ISOLATE 1: Escherichia coli (A)  Final    Comment: Greater than 100,000 CFU/mL of Escherichia coli  Susceptibility   Escherichia coli - URINE CULTURE, REFLEX    AMOX/CLAVULANIC >=32 Resistant     AMPICILLIN >=32 Resistant     AMPICILLIN/SULBACTAM 16 Intermediate     CEFAZOLIN* >=64 Resistant      * For uncomplicated UTI caused by E. coli, K. pneumoniae or P. mirabilis: Cefazolin is susceptible if MIC <32 mcg/mL and predicts susceptible to the oral agents cefaclor, cefdinir, cefpodoxime, cefprozil, cefuroxime, cephalexin and loracarbef.     CEFTAZIDIME <=1 Sensitive     CEFEPIME <=1 Sensitive     CEFTRIAXONE <=1 Sensitive     CIPROFLOXACIN <=0.25 Sensitive     LEVOFLOXACIN <=0.12 Sensitive     GENTAMICIN <=1 Sensitive     IMIPENEM <=0.25 Sensitive     NITROFURANTOIN <=16 Sensitive     PIP/TAZO <=4 Sensitive     TOBRAMYCIN <=1 Sensitive     TRIMETH/SULFA* <=20 Sensitive      * For uncomplicated UTI caused by E. coli, K. pneumoniae or P. mirabilis: Cefazolin is susceptible if MIC <32 mcg/mL and predicts susceptible to the oral agents cefaclor, cefdinir, cefpodoxime, cefprozil, cefuroxime, cephalexin and loracarbef. Legend: S = Susceptible  I = Intermediate R = Resistant  NS = Not susceptible SDD = Susceptible Dose Dependent * = Not Tested  NR = Not Reported **NN = See Therapy Comments   Urine Culture     Status: Abnormal   Collection Time: 01/15/24  9:31 AM   Specimen: Urine, Random  Result Value Ref Range Status   Specimen Description   Final    URINE, RANDOM Performed at [facility], [address] [place], [place] [address]    Special Requests   Final    NONE Reflexed from [id number] Performed at [id number], [address] [place], [place] [address]    Culture >=100,000 COLONIES/mL ESCHERICHIA COLI (A)  Final   Report Status 01/17/2024 FINAL  Final   Organism ID, Bacteria ESCHERICHIA COLI (A)  Final      Susceptibility   Escherichia coli - MIC*    AMPICILLIN >=32 RESISTANT Resistant     CEFAZOLIN >=64 RESISTANT Resistant     CEFEPIME <=0.12 SENSITIVE Sensitive     CEFTRIAXONE <=0.25 SENSITIVE Sensitive     CIPROFLOXACIN <=0.25 SENSITIVE Sensitive     GENTAMICIN <=1 SENSITIVE Sensitive     IMIPENEM <=0.25 SENSITIVE Sensitive     NITROFURANTOIN <=16 SENSITIVE Sensitive     TRIMETH/SULFA <=20 SENSITIVE Sensitive     AMPICILLIN/SULBACTAM >=32 RESISTANT Resistant     PIP/TAZO 16 SENSITIVE Sensitive ug/mL    * >=100,000 COLONIES/mL ESCHERICHIA COLI  Blood culture (routine x 2)     Status: None   Collection Time: 01/15/24 10:05 AM   Specimen: BLOOD LEFT ARM  Result Value Ref Range Status   Specimen Description   Final    BLOOD LEFT ARM Performed at [facility], [address] [place], [place] [address]    Special Requests   Final    BOTTLES DRAWN AEROBIC AND ANAEROBIC Blood Culture adequate volume Performed at [facility], [address] [place], [place] [address]    Culture   Final    NO GROWTH 5 DAYS Performed at [facility], [address], [place], [place] [address]    Report Status 01/20/2024 FINAL  Final  Blood culture (routine x 2)     Status: None   Collection Time: 01/15/24 10:05 AM   Specimen:  BLOOD RIGHT ARM  Result Value Ref Range Status   Specimen Description   Final    BLOOD RIGHT ARM Performed at [facility], [address] [place], [place] [address]    Special Requests   Final    BOTTLES DRAWN AEROBIC AND ANAEROBIC Blood Culture adequate volume Performed at [facility], [address] [place], [place] [address]    Culture   Final    NO GROWTH 5 DAYS Performed at [facility], [address] [place], [place] [address]    Report Status 01/20/2024 FINAL  Final  Aerobic/Anaerobic Culture w Gram Stain (surgical/deep wound)     Status: None (Preliminary result)   Collection Time: 01/18/24  3:07 PM   Specimen: Abscess  Result Value Ref Range Status   Specimen Description   Final    ABSCESS Performed at [facility], [address], [place], [place] [address]    Special Requests   Final    NONE Performed at [facility], [address], [place], [place] [address]    Gram Stain   Final    MODERATE WBC PRESENT, PREDOMINANTLY PMN RARE GRAM NEGATIVE RODS Performed at [facility], [address] [place], [place] [address]    Culture   Final    MODERATE ESCHERICHIA COLI SUSCEPTIBILITIES TO FOLLOW NO ANAEROBES ISOLATED; CULTURE IN PROGRESS FOR 5 DAYS    Report Status PENDING  Incomplete     Serology:   Imaging: If present, new imagings (plain films, ct scans, and mri) have been personally visualized and interpreted; radiology reports have been reviewed. Decision making incorporated into the Impression / Recommendations.  01/19/24 vascular u/s upper ext Pending read     3/31 mri liver 1. Multilobulated, internally septated rim enhancing lesion within the inferior right lobe of the liver, hepatic segment V/VI measuring 4.5 x 3.7 cm. Second small component which is inseparable from this larger lesion and the liver capsule situated just inferiorly measuring 2.5 x 2.4 cm. Imaging characteristics are strongly suggestive of hepatic abscess, however a cystic metastasis could also have this appearance depending upon clinical presentation. 2. Additional small enhancing lesion in the anterior right lobe of the liver, hepatic segment VIII measuring 1.2 x 1.0 cm without internal hypoenhancement or fluid character.  This is less specific, and could reflect a small phlegmon or additional metastasis. 3. No lymphadenopathy or other evidence of metastatic disease in the abdomen.   3/30 ct chest abd pelv noncontrast 1. No acute traumatic injury to the chest, abdomen or pelvis. 2. There is a new ill-defined heterogeneous hypoattenuating approximately 4.2 x 4.8 cm mass in the right hepatic lobe, segment  5/6, which is new since the prior study and highly concerning for neoplastic process. There is an additional smaller approximately 1 cm sized hypoattenuating structure in the right hepatic lobe, segment 8, which is also concerning for neoplastic process. Further evaluation with multiphasic contrast-enhanced MRI or CT scan abdomen as per liver mass protocol is recommended. 3. There is an irregular approximately 1.4 x 1.7 cm soft tissue attenuation nodule in the right upper quadrant abutting the right inferior liver surface, favored to represent metastatic deposit. 4. There is a subpleural solid noncalcified 6 x 7 mm nodule in the left lung lower lobe, which is grossly unchanged since the prior study but new since the prior study from 12/18/2021. This is indeterminate. 5. Multiple other nonacute observations, as described above.  3/30 ct l-spine 1. No acute finding. 2. 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 3. Advanced degeneration with multilevel laminectomy.   Raymondo Band, MD [facility] [phone] pager    01/20/2024, 1:58 PM

## 2024-01-20 NOTE — Progress Notes (Signed)
 PHARMACY - ANTICOAGULATION CONSULT NOTE  Pharmacy Consult for IV heparin  Indication: VYE  Allergies  Allergen Reactions   Canagliflozin Other (See Comments)    Pt reports that the use of Invokana caused acute KIDNEY FAILURE ([facility] records) and "allergic," per [facility]   Aspirin Other (See Comments)    Gastroesophageal reflux and "allergic," per [facility]    Atorvastatin Other (See Comments)    Myalgias, GI upset, and "allergic," per [facility]   Statins Other (See Comments)    Muscle pain- "allergic," per [facility]    Patient Measurements: Height: 5\' 11"  (180.3 cm) Weight: 97 kg (213 lb 12.8 oz) IBW/kg (Calculated) : 75.3 HEPARIN DW (KG): 95  Vital Signs: Temp: 98 F (36.7 C) (04/04 0450) Temp Source: Oral (04/04 0450) BP: 180/85 (04/04 0450) Pulse Rate: 86 (04/04 0450)  Labs: Recent Labs    01/18/24 0458 01/19/24 0444 01/20/24 0443  HGB 7.7* 7.7* 7.6*  HCT 24.4* 25.9* 23.7*  PLT 343 391 329  CREATININE 2.74* 2.57* 2.74*    Estimated Creatinine Clearance: 25.5 mL/min (A) (by C-G formula based on SCr of 2.74 mg/dL (H)).   Medical History: Past Medical History:  Diagnosis Date   Anemia    Essential hypertension 07/18/2017   Foot drop, left foot    due to back surgery in 01/2021   GERD (gastroesophageal reflux disease)    History of colonic polyps    History of hiatal hernia    many years ago   History of rheumatic fever    History of stroke 07/18/2017   Hypertriglyceridemia 07/18/2017   S/P TAVR (transcatheter aortic valve replacement) 01/26/2022   [name] 29mm via TF approach with Dr. Lynnette Caffey and Dr. Laneta Simmers   Severe aortic stenosis    Type 2 diabetes mellitus with diabetic neuropathy, unspecified (HCC) 07/18/2017    Medications:  Medications Prior to Admission  Medication Sig Dispense Refill Last Dose/Taking   acetaminophen (TYLENOL) 325 MG tablet Take 650 mg by mouth every 6 (six) hours as needed for mild pain or headache.   01/15/2024 Morning   aspirin EC 81 MG  tablet Take 1 tablet (81 mg total) by mouth daily. Swallow whole. 90 tablet 3 01/14/2024 Morning   B Complex Vitamins (B COMPLEX PO) Take 1 tablet by mouth daily.   01/14/2024 Morning   Cholecalciferol (VITAMIN D3 SUPER STRENGTH) 50 MCG (2000 UT) CAPS Take 2,000 Units by mouth daily.   01/14/2024 Morning   escitalopram (LEXAPRO) 20 MG tablet Take 1 tablet (20 mg total) by mouth daily. 90 tablet 2 01/14/2024 Evening   etanercept (ENBREL MINI) 50 MG/ML injection Inject 1 mL (50 mg total) into the skin once a week. 12 mL 0 01/09/2024   ezetimibe (ZETIA) 10 MG tablet Take 1 tablet (10 mg total) by mouth daily. 90 tablet 3 01/14/2024 Evening   fenofibrate micronized (LOFIBRA) 200 MG capsule TAKE 1 CAPSULE DAILY BEFOREBREAKFAST 90 capsule 3 01/14/2024 Morning   finasteride (PROSCAR) 5 MG tablet Take 1 tablet (5 mg total) by mouth daily. 90 tablet 3 01/11/2024   HYDROcodone-acetaminophen (NORCO) 7.5-325 MG tablet Take 1 tablet by mouth every 6 (six) hours as needed for moderate pain (pain score 4-6). 30 tablet 0 01/14/2024 Bedtime   insulin aspart (NOVOLOG FLEXPEN) 100 UNIT/ML FlexPen Inject 0-20 Units into the skin See admin instructions. Inject 0-10 units into the skin before meals, PER SLIDING SCALE: BGL 0-200 = give nothing, 201-250 = 2 units, 251-300 = 4 units, 301-350 = 6 units, 351-400 = 8 units, >  400 = 10 units, push fluids, and repeat BGL check in 2 hours. If BGL remains >400 after 2 hours, CALL MD.   01/14/2024 Bedtime   insulin degludec (TRESIBA FLEXTOUCH) 100 UNIT/ML FlexTouch Pen Inject 20 Units into the skin daily.   01/14/2024 Bedtime   leflunomide (ARAVA) 10 MG tablet TAKE 1 TABLET DAILY 90 tablet 0 01/14/2024 Morning   lisinopril (ZESTRIL) 40 MG tablet Take 1 tablet (40 mg total) by mouth in the morning. 90 tablet 3 01/14/2024 Morning   magnesium oxide (MAG-OX) 400 MG tablet Take 400 mg by mouth daily with lunch.   01/14/2024 Evening   melatonin 5 MG TABS Take 5 mg by mouth at bedtime.   01/14/2024 Bedtime    Multiple Vitamins-Minerals (MULTIVITAMIN WITH MINERALS) tablet Take 1 tablet by mouth daily with breakfast.   01/14/2024 Morning   Omega 3 1000 MG CAPS Take 2,000 mg by mouth daily with lunch.   01/14/2024 Morning   omeprazole (PRILOSEC) 20 MG capsule Take 1 capsule (20 mg total) by mouth daily. 90 capsule 3 01/14/2024 Morning   Potassium 99 MG TABS Take 99 mg by mouth every evening.   01/14/2024 Noon   vitamin B-12 (CYANOCOBALAMIN) 1000 MCG tablet Take 1,000 mcg by mouth daily.   01/14/2024 Morning   vitamin E 180 MG (400 UNITS) capsule Take 400 Units by mouth daily.   01/14/2024 Morning   amoxicillin (AMOXIL) 500 MG tablet Take 4 tablets (2,000 mg total) by mouth as directed. 1 HOUR PRIOR TO DENTAL APPOINTMENTS (Patient not taking: Reported on 01/15/2024) 12 tablet 6 Not Taking   cefdinir (OMNICEF) 300 MG capsule Take 1 capsule (300 mg total) by mouth 2 (two) times daily for 7 days. 14 capsule 0    fluticasone (FLONASE) 50 MCG/ACT nasal spray Place 2 sprays into both nostrils daily. (Patient not taking: Reported on 01/15/2024) 16 g 6 Not Taking   HYDROcodone-acetaminophen (NORCO) 7.5-325 MG tablet Take 1 tablet by mouth every 6 (six) hours as needed for moderate pain (pain score 4-6). (Patient not taking: Reported on 01/15/2024) 30 tablet 0 Not Taking   HYDROcodone-acetaminophen (NORCO) 7.5-325 MG tablet Take 1 tablet by mouth every 6 (six) hours as needed for moderate pain (pain score 4-6). (Patient not taking: Reported on 01/15/2024) 30 tablet 0 Not Taking   ONETOUCH VERIO test strip 1 each by Other route as needed for other (Glucose).       Assessment: Pharmacy is consulted to start IV heparin in 78 yo male diagnosed with acute DVT involving the right radial veins and acute superficial vein thrombosis involving the right basilic vein and right cephalic vein. Pt not on anticoagulation PTA . There has also been concerns for GI bleed.   Per consult, no bolus due to anemia.   Today, 01/20/24 Hgb 7.6  low but stable Plt 329  Scr 2.74 mg/dl, CrCl 25 ml/min    Goal of Therapy:  Heparin level 0.3-0.7 units/ml Monitor platelets by anticoagulation protocol: Yes   Plan:  Start heparin at 1600 units/hr  Obtain HL 8 hours after start of infusion Daily CBC, HL while on heparin drip Monitor for signs and symptoms of bleeding    Adalberto Cole, PharmD, BCPS 01/20/2024 10:04 AM

## 2024-01-20 NOTE — Progress Notes (Signed)
 PROGRESS NOTE    Tommy Green  VWU:[id number] DOB: [birth year] DOA: 01/15/2024 PCP: [name], DO  Chief Complaint  Patient presents with   Urinary Frequency   Fall    Brief Narrative:    78 yo male with PMH malignant melanoma, DMII, RA, GERD, hx CVA, HLD who presented with weakness, recurrent falls, and some persistent dark stools.  He had fallen about 1 week prior in the bathroom and struck his right face resulting in Tommy Green bruised eye as well.  On workup he was noted to have Hgb 6.5 g/dL and underwent 2 units PRBC on admission. Multiple imaging studies were obtained due to his falls and no traumatic injuries were noted. He was however noted to have Tommy Green right hepatic lobe lesion measuring 4.2 x 4.8 cm along with Tommy Boyland left lower lobe nodule measuring 6 x 7 mm. GI was also consulted due to concern for GI bleed.  He has Tommy Green known history of esophageal varices,  Schatzki ring, and gastric polyps on last EGD in Nov 2021.    He was admitted for ongoing GI evaluation and further workup regarding masses noted on scans.    Assessment & Plan:   Principal Problem:   GI bleed Active Problems:   Acute renal failure superimposed on stage 3b chronic kidney disease (HCC)   Liver mass   Lung nodule   Essential hypertension   Hyperlipidemia associated with type 2 diabetes mellitus (HCC)   Type 2 diabetes mellitus with diabetic neuropathy, unspecified (HCC)   S/P TAVR (transcatheter aortic valve replacement)   BPH associated with nocturia   Rheumatoid arteritis (HCC)   Heme positive stool   Abnormal CT of liver   Acute on chronic anemia   Hepatic abscess  Liver Lesion Liver Abscess MRI 3/31 with multilobulated, internally septated rim enhancing lesion within the inferior right lobe of the liver, hepatic segment V/VI measuring 4.5x3.7 cm.  Second small component which is inserpatble from this larger lesion and the liver capsule situated just inferiorly measuring 2/5x2.4 cm.  Abscess  vs cystic metastasis.  Small enhancing lesion in the anterior right lobe of the liver. S/p US aspiration and core biopsy 4/2 - follow pathology (reactive fibrosis and inflammation c/w abscess) Rare gram negative rods on gram stain, concerning for liver abscess (though no pus able to be aspirated yesterday) - follow final culture results (Tommy Green, sensitivities pending) Currently on abx Appreciate ID consult - transition to oral abx when culture results, recommending 4 weeks abx with 1 refill at discharge  GI Bleed  Anemia  Iron Def Presented with Hb 6.5 on admission (was ~8-9 previously) S/p 3 units pRBC S/p EGD 4/1 -> with posterior oropharynx abnormal with congested mucosa and hematin, grade 1 esophageal varices noted proximally and in the middle of the esophagus.  No gross lesions in the distal esophagus.  Low grade narrowing schatzki ring.  5 cm hiatal hernia.  Erythematous mucosa in the stomach (biopsied).  No evidence of portal gastropathy or varices of the stomach.  Biopsies suggestive of iron pill gastritis, chronic peptic duodenitis.  GI recommending ENT evaluation (planning for outpatient follow up - follow up placed in discharge instructions), IR evaluation of hepatic lesion (s/p biopsy).  (See report)  Nausea  Vomiting Unclear cause, will monitor Antiemetics Plain films pending  DVT  Will cautiously anticoagulate, trend H/H  AKI on CKD IIIb Metabolic Acidosis Baseline creatinine 1.8-2.2 Current creatinine 2.74 Will trend Add bicarb  Mechanical Falls Therapy  Posterior oropharynx abnormal  with congested mucosa and hematin  ENT planning outpatient follow up  Proximal Upstream Varicose Veins Warrants additional follow up - per GI can be seen with significant congestive venous issues, r/o SVC syndrome or obstructive venous outflow  Upper extremity US as above  Lung Nodule 6x7 mm nodule in left lower lobe, warrants follow up  Rheumatoid Arthritis Enbrel  chronically  Tommy Green UTI On abx as above  BPH Finasteride  S/p TAVR  T2DM A1c 5.8 12/2023 SSI, basal insulin on hold  Hyperlipidemia Zetia  Hypertension Lisinopril on hold    DVT prophylaxis: SCD Code Status: full Family Communication: wife at bedside Disposition:   Status is: Inpatient Remains inpatient appropriate because: need for continued inpatient care   Consultants:  GI IR  Procedures:  Upper endoscopy US aspiration and core biopsy   Antimicrobials:  Anti-infectives (From admission, onward)    Start     Dose/Rate Route Frequency Ordered Stop   01/20/24 1030  metroNIDAZOLE (FLAGYL) tablet 500 mg        500 mg Oral Every 12 hours 01/20/24 0933     01/20/24 1000  cefTRIAXone (ROCEPHIN) 2 g in sodium chloride 0.9 % 100 mL IVPB        2 g 200 mL/hr over 30 Minutes Intravenous Every 24 hours 01/19/24 0947     01/17/24 0845  metroNIDAZOLE (FLAGYL) IVPB 500 mg  Status:  Discontinued        500 mg 100 mL/hr over 60 Minutes Intravenous 2 times daily 01/17/24 0759 01/20/24 0933   01/16/24 1000  cefTRIAXone (ROCEPHIN) 1 g in sodium chloride 0.9 % 100 mL IVPB        1 g 200 mL/hr over 30 Minutes Intravenous Every 24 hours 01/15/24 1600 01/19/24 1009   01/15/24 1000  cefTRIAXone (ROCEPHIN) 1 g in sodium chloride 0.9 % 100 mL IVPB        1 g 200 mL/hr over 30 Minutes Intravenous  Once 01/15/24 0951 01/15/24 1037       Subjective: Nausea/vomiting this AM No abd pain  Objective: Vitals:   01/20/24 1243 01/20/24 1258 01/20/24 1300 01/20/24 1640  BP: (!) 187/83 (!) 182/84 (!) 182/84 (!) 183/73  Pulse: 79 80 80 66  Resp: (!) 21 19  18   Temp: 98.3 F (36.8 C) 98.3 F (36.8 C) 98.2 F (36.8 C) 98 F (36.7 C)  TempSrc: Oral Oral Oral Oral  SpO2: 100% 99% 98%   Weight:      Height:        Intake/Output Summary (Last 24 hours) at 01/20/2024 1732 Last data filed at 01/20/2024 1630 Gross per 24 hour  Intake 1514.23 ml  Output 1900 ml  Net -385.77 ml    Filed Weights   01/15/24 0924  Weight: 97 kg    Examination:  General: No acute distress. Cardiovascular: RRR Lungs: unlabored Abdomen: Soft, nontender, nondistended  Neurological: Alert and oriented 3. Moves all extremities 4 with equal strength. Cranial nerves II through XII grossly intact. Extremities: No clubbing or cyanosis. No edema.   Data Reviewed: I have personally reviewed following labs and imaging studies  CBC: Recent Labs  Lab 01/15/24 0931 01/15/24 1359 01/16/24 0425 01/16/24 1013 01/17/24 0450 01/18/24 0458 01/19/24 0444 01/20/24 0443  WBC 11.2*  --  12.1*  --  14.1* 12.5* 16.2* 12.9*  NEUTROABS 7.4  --   --   --  10.7* 9.2* 13.5* 10.0*  HGB 6.5*   < > 8.5* 8.9* 8.2* 7.7* 7.7* 7.6*  HCT 20.0*   < > 25.8* 27.9* 26.7* 24.4* 25.9* 23.7*  MCV 77.5*  --  81.9  --  85.6 84.4 90.2 84.3  PLT 433*  --  418*  --  403* 343 391 329   < > = values in this interval not displayed.    Basic Metabolic Panel: Recent Labs  Lab 01/16/24 0425 01/17/24 0450 01/18/24 0458 01/19/24 0444 01/20/24 0443  NA 131* 134* 136 136 133*  K 3.8 3.6 3.6 3.4* 3.3*  CL 105 108 111 110 107  CO2 14* 15* 16* 13* 15*  GLUCOSE 122* 147* 159* 153* 157*  BUN 43* 40* 44* 43* 45*  CREATININE 2.47* 2.50* 2.74* 2.57* 2.74*  CALCIUM 8.8* 8.7* 8.7* 8.6* 8.3*  MG  --  1.8 1.8 1.7 1.7    GFR: Estimated Creatinine Clearance: 25.5 mL/min (Kortne All) (by C-G formula based on SCr of 2.74 mg/dL (H)).  Liver Function Tests: Recent Labs  Lab 01/16/24 1013  AST 26  ALT 22  ALKPHOS 64  BILITOT 0.7  PROT 6.9  ALBUMIN 3.0*    CBG: Recent Labs  Lab 01/19/24 1626 01/19/24 2018 01/20/24 0732 01/20/24 1127 01/20/24 1627  GLUCAP 132* 192* 138* 151* 128*     Recent Results (from the past 240 hours)  Urine Culture     Status: Abnormal   Collection Time: 01/11/24  1:33 PM   Specimen: Urine  Result Value Ref Range Status   MICRO NUMBER: [id number]  Final   SPECIMEN QUALITY: Adequate  Final    Sample Source NOT GIVEN  Final   STATUS: FINAL  Final   ISOLATE 1: Escherichia Green (Tommy Green)  Final    Comment: Greater than 100,000 CFU/mL of Escherichia Green      Susceptibility   Escherichia Green - URINE CULTURE, REFLEX    AMOX/CLAVULANIC >=32 Resistant     AMPICILLIN >=32 Resistant     AMPICILLIN/SULBACTAM 16 Intermediate     CEFAZOLIN* >=64 Resistant      * For uncomplicated UTI caused by Tommy Green, K. pneumoniae or P. mirabilis: Cefazolin is susceptible if MIC <32 mcg/mL and predicts susceptible to the oral agents cefaclor, cefdinir, cefpodoxime, cefprozil, cefuroxime, cephalexin and loracarbef.     CEFTAZIDIME <=1 Sensitive     CEFEPIME <=1 Sensitive     CEFTRIAXONE <=1 Sensitive     CIPROFLOXACIN <=0.25 Sensitive     LEVOFLOXACIN <=0.12 Sensitive     GENTAMICIN <=1 Sensitive     IMIPENEM <=0.25 Sensitive     NITROFURANTOIN <=16 Sensitive     PIP/TAZO <=4 Sensitive     TOBRAMYCIN <=1 Sensitive     TRIMETH/SULFA* <=20 Sensitive      * For uncomplicated UTI caused by Tommy Green, K. pneumoniae or P. mirabilis: Cefazolin is susceptible if MIC <32 mcg/mL and predicts susceptible to the oral agents cefaclor, cefdinir, cefpodoxime, cefprozil, cefuroxime, cephalexin and loracarbef. Legend: S = Susceptible  I = Intermediate R = Resistant  NS = Not susceptible SDD = Susceptible Dose Dependent * = Not Tested  NR = Not Reported **NN = See Therapy Comments   Urine Culture     Status: Abnormal   Collection Time: 01/15/24  9:31 AM   Specimen: Urine, Random  Result Value Ref Range Status   Specimen Description   Final    URINE, RANDOM Performed at [facility], [address] [place], [place] [address]    Special Requests   Final    NONE Reflexed from [id number] Performed  at [facility], [address] [place], [place] [address]    Culture >=100,000 COLONIES/mL ESCHERICHIA Green (Tommy Green)  Final   Report Status 01/17/2024 FINAL  Final   Organism ID, Bacteria  ESCHERICHIA Green (Tommy Green)  Final      Susceptibility   Escherichia Green - MIC*    AMPICILLIN >=32 RESISTANT Resistant     CEFAZOLIN >=64 RESISTANT Resistant     CEFEPIME <=0.12 SENSITIVE Sensitive     CEFTRIAXONE <=0.25 SENSITIVE Sensitive     CIPROFLOXACIN <=0.25 SENSITIVE Sensitive     GENTAMICIN <=1 SENSITIVE Sensitive     IMIPENEM <=0.25 SENSITIVE Sensitive     NITROFURANTOIN <=16 SENSITIVE Sensitive     TRIMETH/SULFA <=20 SENSITIVE Sensitive     AMPICILLIN/SULBACTAM >=32 RESISTANT Resistant     PIP/TAZO 16 SENSITIVE Sensitive ug/mL    * >=100,000 COLONIES/mL ESCHERICHIA Green  Blood culture (routine x 2)     Status: None   Collection Time: 01/15/24 10:05 AM   Specimen: BLOOD LEFT ARM  Result Value Ref Range Status   Specimen Description   Final    BLOOD LEFT ARM Performed at [facility], [address] [place], [place] [address]    Special Requests   Final    BOTTLES DRAWN AEROBIC AND ANAEROBIC Blood Culture adequate volume Performed at [facility], [address] [place], [place] [address]    Culture   Final    NO GROWTH 5 DAYS Performed at [facility], [address], [place], [place] [address]    Report Status 01/20/2024 FINAL  Final  Blood culture (routine x 2)     Status: None   Collection Time: 01/15/24 10:05 AM   Specimen: BLOOD RIGHT ARM  Result Value Ref Range Status   Specimen Description   Final    BLOOD RIGHT ARM Performed at [facility], [address] [place], [place] [address]    Special Requests   Final    BOTTLES DRAWN AEROBIC AND ANAEROBIC Blood Culture adequate volume Performed at [facility], [address], [place], [place] [address]    Culture   Final    NO GROWTH 5 DAYS Performed at [facility], [address], [place], [place] [address]    Report Status 01/20/2024 FINAL  Final  Aerobic/Anaerobic Culture w Gram Stain (surgical/deep wound)     Status: None (Preliminary result)    Collection Time: 01/18/24  3:07 PM   Specimen: Abscess  Result Value Ref Range Status   Specimen Description   Final    ABSCESS Performed at [facility], [address] [place], [place] [address]    Special Requests   Final    NONE Performed at [facility], [address] [place], [place] [address]    Gram Stain   Final    MODERATE WBC PRESENT, PREDOMINANTLY PMN RARE GRAM NEGATIVE RODS Performed at [facility], [address] [place], [place] [address]    Culture   Final    MODERATE ESCHERICHIA Green SUSCEPTIBILITIES TO FOLLOW NO ANAEROBES ISOLATED; CULTURE IN PROGRESS FOR 5 DAYS    Report Status PENDING  Incomplete         Radiology Studies: ECHOCARDIOGRAM COMPLETE Result Date: 01/20/2024    ECHOCARDIOGRAM REPORT   Patient Name:   Tommy Green Date of Exam: 01/20/2024 Medical Rec #:  [id number]  Height:       71.0 in Accession #:    [id number]        Weight:       213.8 lb Date of Birth:  18-Nov-1945          BSA:          2.169 m Patient Age:    78 years          BP:           180/85 mmHg Patient Gender: M                 HR:           70 bpm. Exam Location:  Inpatient Procedure: 2D Echo, 3D Echo, Cardiac Doppler, Color Doppler and Strain Analysis            (Both Spectral and Color Flow Doppler were utilized during            procedure). Indications:    S/P AVR  History:        Patient has prior history of Echocardiogram examinations, most                 recent 01/26/2023. Risk Factors:Hypertension, Diabetes,                 Dyslipidemia and Former Smoker.                 Aortic Valve: 29 mm Sapien prosthetic, stented (TAVR) valve is                 present in the aortic position.  Sonographer:    Karma Ganja Referring Phys: [phone] Khya Halls CALDWELL POWELL JR  Sonographer Comments: Global longitudinal strain was attempted. IMPRESSIONS  1. Left ventricular ejection fraction, by estimation, is 55 to 60%. Left ventricular ejection fraction  by 3D volume is 54 %. Left ventricular ejection fraction by 2D MOD biplane is 55.3 %. The left ventricle has normal function. The left ventricle has no regional wall motion abnormalities. There is mild concentric left ventricular hypertrophy. Left ventricular diastolic parameters are consistent with Grade I diastolic dysfunction (impaired relaxation). The average left ventricular global longitudinal strain is -13.2 %. The global longitudinal strain is abnormal.  2. Peak RV-RA gradient 23 mmHg. Right ventricular systolic function is normal. The right ventricular size is normal.  3. Left atrial size was mild to moderately dilated.  4. The mitral valve is degenerative. Trivial mitral valve regurgitation. Mild mitral stenosis. The mean mitral valve gradient is 6.0 mmHg, but calculated MVA by VTI is 2.97 cm^2. Moderate mitral annular calcification.  5. Bioprosthetic aortic valve s/p TAVR with 29 mm Edwards Sapien THV. Mean gradient 10 mmHg, EOA 2.73 cm^2. Dimensionless index 43. Trivial perivalvular leakage.  6. The IVC was not visualized. FINDINGS  Left Ventricle: Left ventricular ejection fraction, by estimation, is 55 to 60%. Left ventricular ejection fraction by 2D MOD biplane is 55.3 %. Left ventricular ejection fraction by 3D volume is 54 %. The left ventricle has normal function. The left ventricle has no regional wall motion abnormalities. The average left ventricular global longitudinal strain is -13.2 %. Strain was performed and the global longitudinal strain is abnormal. The left ventricular internal cavity size was normal in size. There is mild concentric left ventricular hypertrophy. Left ventricular diastolic parameters are consistent with Grade I diastolic dysfunction (impaired relaxation). Right Ventricle: Peak RV-RA gradient 23 mmHg. The right ventricular size is normal. No increase in right ventricular wall  thickness. Right ventricular systolic function is normal. Left Atrium: Left atrial size was mild  to moderately dilated. Right Atrium: Right atrial size was normal in size. Pericardium: There is no evidence of pericardial effusion. Mitral Valve: The mitral valve is degenerative in appearance. There is moderate calcification of the mitral valve leaflet(s). Moderate mitral annular calcification. Trivial mitral valve regurgitation. Mild mitral valve stenosis. MV peak gradient, 15.1 mmHg. The mean mitral valve gradient is 6.0 mmHg. Tricuspid Valve: The tricuspid valve is normal in structure. Tricuspid valve regurgitation is trivial. Aortic Valve: Bioprosthetic aortic valve s/p TAVR with 29 mm Edwards Sapien THV. Mean gradient 10 mmHg, EOA 2.73 cm^2. Dimensionless index 43. Trivial perivalvular leakage. The aortic valve has been repaired/replaced. Aortic valve regurgitation is trivial. Aortic valve mean gradient measures 10.0 mmHg. Aortic valve peak gradient measures 16.6 mmHg. Aortic valve area, by VTI measures 2.73 cm. There is Tommy Green 29 mm Sapien prosthetic, stented (TAVR) valve present in the aortic position. Pulmonic Valve: The pulmonic valve was normal in structure. Pulmonic valve regurgitation is trivial. Aorta: The aortic root is normal in size and structure. Venous: The IVC was not visualized. The inferior vena cava was not well visualized. IAS/Shunts: No atrial level shunt detected by color flow Doppler. Additional Comments: 3D was performed not requiring image post processing on an independent workstation and was normal.  LEFT VENTRICLE PLAX 2D                        Biplane EF (MOD) LVIDd:         4.70 cm         LV Biplane EF:   Left LVIDs:         3.90 cm                          ventricular LV PW:         1.20 cm                          ejection LV IVS:        1.50 cm                          fraction by LVOT diam:     2.65 cm                          2D MOD LV SV:         118                              biplane is LV SV Index:   54                               55.3 %. LVOT Area:     5.52 cm                                 Diastology                                LV e' medial:    5.11 cm/s LV Volumes (MOD)  LV E/e' medial:  22.7 LV vol d, MOD    164.0 ml      LV e' lateral:   5.00 cm/s A2C:                           LV E/e' lateral: 23.2 LV vol d, MOD    181.0 ml A4C:                           2D Longitudinal LV vol s, MOD    75.6 ml       Strain A2C:                           2D Strain GLS   -13.2 % LV vol s, MOD    77.2 ml       Avg: A4C: LV SV MOD A2C:   88.4 ml       3D Volume EF LV SV MOD A4C:   181.0 ml      LV 3D EF:    Left LV SV MOD BP:    95.6 ml                    ventricul                                             ar                                             ejection                                             fraction                                             by 3D                                             volume is                                             54 %.                                 3D Volume EF:                                3D EF:        54 %  LV EDV:       188 ml                                LV ESV:       86 ml                                LV SV:        102 ml RIGHT VENTRICLE RV Basal diam:  4.00 cm RV S prime:     14.30 cm/s TAPSE (M-mode): 3.0 cm LEFT ATRIUM              Index        RIGHT ATRIUM           Index LA diam:        4.90 cm  2.26 cm/m   RA Area:     18.10 cm LA Vol (A2C):   109.0 ml 50.25 ml/m  RA Volume:   43.90 ml  20.24 ml/m LA Vol (A4C):   65.7 ml  30.29 ml/m LA Biplane Vol: 87.4 ml  40.29 ml/m  AORTIC VALVE AV Area (Vmax):    2.54 cm AV Area (Vmean):   2.37 cm AV Area (VTI):     2.73 cm AV Vmax:           204.00 cm/s AV Vmean:          145.000 cm/s AV VTI:            0.433 m AV Peak Grad:      16.6 mmHg AV Mean Grad:      10.0 mmHg LVOT Vmax:         94.10 cm/s LVOT Vmean:        62.400 cm/s LVOT VTI:          0.214 m LVOT/AV VTI ratio: 0.49 MITRAL VALVE                TRICUSPID VALVE MV Area (PHT):  4.41 cm     TR Peak grad:   22.7 mmHg MV Area VTI:   2.97 cm     TR Vmax:        238.00 cm/s MV Peak grad:  15.1 mmHg MV Mean grad:  6.0 mmHg     SHUNTS MV Vmax:       1.94 m/s     Systemic VTI:  0.21 m MV Vmean:      121.0 cm/s   Systemic Diam: 2.65 cm MV Decel Time: 172 msec MV E velocity: 116.00 cm/s MV Zharia Conrow velocity: 177.00 cm/s MV E/Napolean Sia ratio:  0.66 Dalton McleanMD Electronically signed by Wilfred Lacy Signature Date/Time: 01/20/2024/3:30:56 PM    Final    VAS [place] UPPER EXTREMITY VENOUS DUPLEX Result Date: 01/20/2024 UPPER VENOUS STUDY  Patient Name:  Tommy Green  Date of Exam:   01/19/2024 Medical Rec #: [id number]          Accession #:    [id number] Date of Birth: 16-Feb-1946           Patient Gender: M Patient Age:   42 years Exam Location:  [facility] Procedure:      VAS [place] UPPER EXTREMITY VENOUS DUPLEX Referring Phys: Dessire Grimes POWELL JR --------------------------------------------------------------------------------  Indications: Edema, and Recent fall/injury. Other Indications: H/O CVA. Comparison Study: No prior exam. Performing Technologist: Fernande Bras  Examination Guidelines: [place] complete evaluation includes B-mode imaging, spectral Doppler, color Doppler, and power Doppler as needed of all accessible portions of each vessel. Bilateral testing is considered an integral part of Shalena Ezzell complete examination. Limited examinations for reoccurring indications may be performed as noted.  Right Findings: +----------+------------+---------+-----------+----------+-------+ RIGHT     CompressiblePhasicitySpontaneousPropertiesSummary +----------+------------+---------+-----------+----------+-------+ IJV           Full       Yes       Yes                      +----------+------------+---------+-----------+----------+-------+ Subclavian    Full       Yes       Yes                      +----------+------------+---------+-----------+----------+-------+ Axillary      Full       Yes       Yes                       +----------+------------+---------+-----------+----------+-------+ Brachial      Full       Yes       Yes                      +----------+------------+---------+-----------+----------+-------+ Radial      Partial      Yes       Yes                      +----------+------------+---------+-----------+----------+-------+ Ulnar         Full                                          +----------+------------+---------+-----------+----------+-------+ Cephalic    Partial      No        No                       +----------+------------+---------+-----------+----------+-------+ Basilic     Partial      Yes       Yes               At [facility]  +----------+------------+---------+-----------+----------+-------+ Thrombus noted in the basilic vein at the antecubital fossa, in the proximal segment of one of the paired radial veins, and in the cephalic vein in the mid forearm at the IV placement. Cystic structure noted in the right shoulder measuring 4.1 x 0.94 x 1.7 cm  Left Findings: +----------+------------+---------+-----------+----------+-------+ LEFT      CompressiblePhasicitySpontaneousPropertiesSummary +----------+------------+---------+-----------+----------+-------+ IJV           Full       Yes       Yes                      +----------+------------+---------+-----------+----------+-------+ Subclavian    Full       Yes       Yes                      +----------+------------+---------+-----------+----------+-------+ Axillary      Full       Yes       Yes                      +----------+------------+---------+-----------+----------+-------+ Brachial  Full       Yes       Yes                      +----------+------------+---------+-----------+----------+-------+ Radial        Full                                          +----------+------------+---------+-----------+----------+-------+ Ulnar         Full                                           +----------+------------+---------+-----------+----------+-------+ Cephalic      Full       Yes       Yes                      +----------+------------+---------+-----------+----------+-------+ Basilic       Full       Yes       Yes                      +----------+------------+---------+-----------+----------+-------+  Summary:  Right: Findings consistent with acute deep vein thrombosis involving the right radial veins. Findings consistent with acute superficial vein thrombosis involving the right basilic vein and right cephalic vein.  Left: No evidence of deep vein thrombosis in the upper extremity. No evidence of superficial vein thrombosis in the upper extremity.  *See table(s) above for measurements and observations.  Diagnosing physician: Heath Lark Electronically signed by Heath Lark on 01/20/2024 at 1:10:44 PM.    Final         Scheduled Meds:  sodium chloride   Intravenous Once   escitalopram  20 mg Oral Daily   ezetimibe  10 mg Oral QPM   fenofibrate  54 mg Oral Daily   finasteride  5 mg Oral Daily   insulin aspart  0-15 Units Subcutaneous TID WC   insulin aspart  0-5 Units Subcutaneous QHS   melatonin  5 mg Oral QHS   metroNIDAZOLE  500 mg Oral Q12H   pantoprazole  40 mg Oral Daily   QUEtiapine  25 mg Oral QHS   Continuous Infusions:  cefTRIAXone (ROCEPHIN)  IV 2 g (01/20/24 0959)   heparin 1,600 Units/hr (01/20/24 1334)   promethazine (PHENERGAN) injection (IM or IVPB) 6.25 mg (01/20/24 1157)     LOS: 5 days    Time spent: over 30 min    Lacretia Nicks, MD [facility]   To contact the attending provider between 7A-7P or the covering provider during after hours 7P-7A, please log into the web site [email or web address] and access using universal [place] password for that web site. If you do not have the password, please call the hospital operator.  01/20/2024, 5:32 PM

## 2024-01-21 DIAGNOSIS — D649 Anemia, unspecified: Secondary | ICD-10-CM | POA: Diagnosis not present

## 2024-01-21 LAB — BASIC METABOLIC PANEL WITH GFR
Anion gap: 13 (ref 5–15)
BUN: 47 mg/dL — ABNORMAL HIGH (ref 8–23)
CO2: 14 mmol/L — ABNORMAL LOW (ref 22–32)
Calcium: 8.2 mg/dL — ABNORMAL LOW (ref 8.9–10.3)
Chloride: 106 mmol/L (ref 98–111)
Creatinine, Ser: 2.97 mg/dL — ABNORMAL HIGH (ref 0.61–1.24)
GFR, Estimated: 21 mL/min — ABNORMAL LOW (ref >60)
Glucose, Bld: 140 mg/dL — ABNORMAL HIGH (ref 70–99)
Potassium: 3.4 mmol/L — ABNORMAL LOW (ref 3.5–5.1)
Sodium: 133 mmol/L — ABNORMAL LOW (ref 135–145)

## 2024-01-21 LAB — TYPE AND SCREEN
ABO/RH(D): O POS
Antibody Screen: NEGATIVE
Unit division: 0

## 2024-01-21 LAB — MAGNESIUM: Magnesium: 1.5 mg/dL — ABNORMAL LOW (ref 1.7–2.4)

## 2024-01-21 LAB — CBC WITH DIFFERENTIAL/PLATELET
Abs Immature Granulocytes: 0.05 10*3/uL (ref 0.00–0.07)
Basophils Absolute: 0.1 10*3/uL (ref 0.0–0.1)
Basophils Relative: 0 %
Eosinophils Absolute: 0.2 10*3/uL (ref 0.0–0.5)
Eosinophils Relative: 2 %
HCT: 26.3 % — ABNORMAL LOW (ref 39.0–52.0)
Hemoglobin: 8.6 g/dL — ABNORMAL LOW (ref 13.0–17.0)
Immature Granulocytes: 0 %
Lymphocytes Relative: 19 %
Lymphs Abs: 2.2 10*3/uL (ref 0.7–4.0)
MCH: 27.3 pg (ref 26.0–34.0)
MCHC: 32.7 g/dL (ref 30.0–36.0)
MCV: 83.5 fL (ref 80.0–100.0)
Monocytes Absolute: 0.9 10*3/uL (ref 0.1–1.0)
Monocytes Relative: 7 %
Neutro Abs: 8.6 10*3/uL — ABNORMAL HIGH (ref 1.7–7.7)
Neutrophils Relative %: 72 %
Platelets: 327 10*3/uL (ref 150–400)
RBC: 3.15 MIL/uL — ABNORMAL LOW (ref 4.22–5.81)
RDW: 17.4 % — ABNORMAL HIGH (ref 11.5–15.5)
WBC: 12.1 10*3/uL — ABNORMAL HIGH (ref 4.0–10.5)
nRBC: 0 % (ref 0.0–0.2)

## 2024-01-21 LAB — GLUCOSE, CAPILLARY
Glucose-Capillary: 140 mg/dL — ABNORMAL HIGH (ref 70–99)
Glucose-Capillary: 108 mg/dL — ABNORMAL HIGH (ref 70–99)
Glucose-Capillary: 149 mg/dL — ABNORMAL HIGH (ref 70–99)
Glucose-Capillary: 121 mg/dL — ABNORMAL HIGH (ref 70–99)

## 2024-01-21 LAB — BPAM RBC
Blood Product Expiration Date: 202505022359
ISSUE DATE / TIME: 202504041228
Unit Type and Rh: 5100

## 2024-01-21 LAB — HEPARIN LEVEL (UNFRACTIONATED)
Heparin Unfractionated: 0.19 [IU]/mL — ABNORMAL LOW (ref 0.30–0.70)
Heparin Unfractionated: 0.32 [IU]/mL (ref 0.30–0.70)

## 2024-01-21 MED ORDER — HYDROCERIN EX CREA: TOPICAL_CREAM | Freq: Two times a day (BID) | CUTANEOUS | Status: DC | PRN

## 2024-01-21 MED ORDER — AMLODIPINE BESYLATE 5 MG PO TABS
5.0000 mg | ORAL_TABLET | Freq: Every day | ORAL | Status: DC
  Administered 2024-01-21 – 2024-01-22 (×2): 5 mg via ORAL
  Filled 2024-01-21 (×2): qty 1

## 2024-01-21 MED ORDER — LACTATED RINGERS IV SOLN: INTRAVENOUS | Status: AC

## 2024-01-21 MED ORDER — BARRIER CREAM NON-SPECIFIED: 1.0000 | TOPICAL_CREAM | Freq: Two times a day (BID) | TOPICAL | Status: DC | PRN

## 2024-01-21 MED ORDER — POLYETHYLENE GLYCOL 3350 17 G PO PACK
17.0000 g | PACK | Freq: Two times a day (BID) | ORAL | Status: DC
  Administered 2024-01-21 (×2): 17 g via ORAL
  Filled 2024-01-21 (×2): qty 1

## 2024-01-21 NOTE — Plan of Care (Signed)
  Problem: Education: Goal: Knowledge of General Education information will improve Description: Including pain rating scale, medication(s)/side effects and non-pharmacologic comfort measures Outcome: Progressing   Problem: Health Behavior/Discharge Planning: Goal: Ability to manage health-related needs will improve Outcome: Progressing   Problem: Clinical Measurements: Goal: Ability to maintain clinical measurements within normal limits will improve Outcome: Progressing Goal: Will remain free from infection Outcome: Progressing Goal: Respiratory complications will improve Outcome: Progressing Goal: Cardiovascular complication will be avoided Outcome: Progressing   Problem: Activity: Goal: Risk for activity intolerance will decrease Outcome: Progressing   Problem: Nutrition: Goal: Adequate nutrition will be maintained Outcome: Progressing   Problem: Coping: Goal: Level of anxiety will decrease Outcome: Progressing   Problem: Elimination: Goal: Will not experience complications related to urinary retention Outcome: Progressing   Problem: Pain Managment: Goal: General experience of comfort will improve and/or be controlled Outcome: Progressing

## 2024-01-21 NOTE — Progress Notes (Signed)
 PHARMACY - ANTICOAGULATION CONSULT NOTE  Pharmacy Consult for IV heparin  Indication: VTE  Allergies  Allergen Reactions   Canagliflozin Other (See Comments)    Pt reports that the use of Invokana caused acute KIDNEY FAILURE ([facility] records) and "allergic," per [facility]   Aspirin Other (See Comments)    Gastroesophageal reflux and "allergic," per [facility]    Atorvastatin Other (See Comments)    Myalgias, GI upset, and "allergic," per [facility]   Statins Other (See Comments)    Muscle pain- "allergic," per [facility]    Patient Measurements: Height: 5\' 11"  (180.3 cm) Weight: 97 kg (213 lb 12.8 oz) IBW/kg (Calculated) : 75.3 HEPARIN DW (KG): 95  Vital Signs: Temp: 98.3 F (36.8 C) (04/05 1345) BP: 159/82 (04/05 1345) Pulse Rate: 81 (04/05 1345)  Labs: Recent Labs    01/19/24 0444 01/20/24 0443 01/20/24 2110 01/21/24 0716 01/21/24 1752  HGB 7.7* 7.6*  --  8.6*  --   HCT 25.9* 23.7*  --  26.3*  --   PLT 391 329  --  327  --   HEPARINUNFRC  --   --  <0.10* 0.19* 0.32  CREATININE 2.57* 2.74*  --  2.97*  --     Estimated Creatinine Clearance: 23.6 mL/min (A) (by C-G formula based on SCr of 2.97 mg/dL (H)).   Medical History: Past Medical History:  Diagnosis Date   Anemia    Essential hypertension 07/18/2017   Foot drop, left foot    due to back surgery in 01/2021   GERD (gastroesophageal reflux disease)    History of colonic polyps    History of hiatal hernia    many years ago   History of rheumatic fever    History of stroke 07/18/2017   Hypertriglyceridemia 07/18/2017   S/P TAVR (transcatheter aortic valve replacement) 01/26/2022   [name] 29mm via TF approach with Dr. Lynnette Caffey and Dr. Laneta Simmers   Severe aortic stenosis    Type 2 diabetes mellitus with diabetic neuropathy, unspecified (HCC) 07/18/2017    Medications:  Medications Prior to Admission  Medication Sig Dispense Refill Last Dose/Taking   acetaminophen (TYLENOL) 325 MG tablet Take 650 mg by mouth every 6 (six)  hours as needed for mild pain or headache.   01/15/2024 Morning   aspirin EC 81 MG tablet Take 1 tablet (81 mg total) by mouth daily. Swallow whole. 90 tablet 3 01/14/2024 Morning   B Complex Vitamins (B COMPLEX PO) Take 1 tablet by mouth daily.   01/14/2024 Morning   Cholecalciferol (VITAMIN D3 SUPER STRENGTH) 50 MCG (2000 UT) CAPS Take 2,000 Units by mouth daily.   01/14/2024 Morning   escitalopram (LEXAPRO) 20 MG tablet Take 1 tablet (20 mg total) by mouth daily. 90 tablet 2 01/14/2024 Evening   etanercept (ENBREL MINI) 50 MG/ML injection Inject 1 mL (50 mg total) into the skin once a week. 12 mL 0 01/09/2024   ezetimibe (ZETIA) 10 MG tablet Take 1 tablet (10 mg total) by mouth daily. 90 tablet 3 01/14/2024 Evening   fenofibrate micronized (LOFIBRA) 200 MG capsule TAKE 1 CAPSULE DAILY BEFOREBREAKFAST 90 capsule 3 01/14/2024 Morning   finasteride (PROSCAR) 5 MG tablet Take 1 tablet (5 mg total) by mouth daily. 90 tablet 3 01/11/2024   HYDROcodone-acetaminophen (NORCO) 7.5-325 MG tablet Take 1 tablet by mouth every 6 (six) hours as needed for moderate pain (pain score 4-6). 30 tablet 0 01/14/2024 Bedtime   insulin aspart (NOVOLOG FLEXPEN) 100 UNIT/ML FlexPen Inject 0-20 Units into the skin See  admin instructions. Inject 0-10 units into the skin before meals, PER SLIDING SCALE: BGL 0-200 = give nothing, 201-250 = 2 units, 251-300 = 4 units, 301-350 = 6 units, 351-400 = 8 units, >400 = 10 units, push fluids, and repeat BGL check in 2 hours. If BGL remains >400 after 2 hours, CALL MD.   01/14/2024 Bedtime   insulin degludec (TRESIBA FLEXTOUCH) 100 UNIT/ML FlexTouch Pen Inject 20 Units into the skin daily.   01/14/2024 Bedtime   leflunomide (ARAVA) 10 MG tablet TAKE 1 TABLET DAILY 90 tablet 0 01/14/2024 Morning   lisinopril (ZESTRIL) 40 MG tablet Take 1 tablet (40 mg total) by mouth in the morning. 90 tablet 3 01/14/2024 Morning   magnesium oxide (MAG-OX) 400 MG tablet Take 400 mg by mouth daily with lunch.   01/14/2024  Evening   melatonin 5 MG TABS Take 5 mg by mouth at bedtime.   01/14/2024 Bedtime   Multiple Vitamins-Minerals (MULTIVITAMIN WITH MINERALS) tablet Take 1 tablet by mouth daily with breakfast.   01/14/2024 Morning   Omega 3 1000 MG CAPS Take 2,000 mg by mouth daily with lunch.   01/14/2024 Morning   omeprazole (PRILOSEC) 20 MG capsule Take 1 capsule (20 mg total) by mouth daily. 90 capsule 3 01/14/2024 Morning   Potassium 99 MG TABS Take 99 mg by mouth every evening.   01/14/2024 Noon   vitamin B-12 (CYANOCOBALAMIN) 1000 MCG tablet Take 1,000 mcg by mouth daily.   01/14/2024 Morning   vitamin E 180 MG (400 UNITS) capsule Take 400 Units by mouth daily.   01/14/2024 Morning   amoxicillin (AMOXIL) 500 MG tablet Take 4 tablets (2,000 mg total) by mouth as directed. 1 HOUR PRIOR TO DENTAL APPOINTMENTS (Patient not taking: Reported on 01/15/2024) 12 tablet 6 Not Taking   cefdinir (OMNICEF) 300 MG capsule Take 1 capsule (300 mg total) by mouth 2 (two) times daily for 7 days. 14 capsule 0    fluticasone (FLONASE) 50 MCG/ACT nasal spray Place 2 sprays into both nostrils daily. (Patient not taking: Reported on 01/15/2024) 16 g 6 Not Taking   HYDROcodone-acetaminophen (NORCO) 7.5-325 MG tablet Take 1 tablet by mouth every 6 (six) hours as needed for moderate pain (pain score 4-6). (Patient not taking: Reported on 01/15/2024) 30 tablet 0 Not Taking   HYDROcodone-acetaminophen (NORCO) 7.5-325 MG tablet Take 1 tablet by mouth every 6 (six) hours as needed for moderate pain (pain score 4-6). (Patient not taking: Reported on 01/15/2024) 30 tablet 0 Not Taking   ONETOUCH VERIO test strip 1 each by Other route as needed for other (Glucose).       Assessment: Pharmacy is consulted to start IV heparin in 78 yo male diagnosed with acute DVT involving the right radial veins and acute superficial vein thrombosis involving the right basilic vein and right cephalic vein. Pt not on anticoagulation PTA . There has also been concerns for  GI bleed.   Per consult, no bolus due to anemia.   Today, 01/21/24 Heparin level 0.32, therapeutic on heparin 2000 units/hr  Hgb low but improved after transfusion 4/4 No complications of therapy noted   Goal of Therapy:  Heparin level 0.3-0.7 units/ml Monitor platelets by anticoagulation protocol: Yes   Plan:  Continue heparin infusion at 2000 units/hr  Check 8 hour heparin level to confirm Daily CBC, heparin level while on heparin drip Monitor for signs and symptoms of bleeding   Pricilla Riffle, PharmD, BCPS Clinical Pharmacist 01/21/2024 6:29 PM

## 2024-01-21 NOTE — Progress Notes (Signed)
 PHARMACY - ANTICOAGULATION CONSULT NOTE  Pharmacy Consult for IV heparin  Indication: VTE  Allergies  Allergen Reactions   Canagliflozin Other (See Comments)    Pt reports that the use of Invokana caused acute KIDNEY FAILURE ([facility] records) and "allergic," per [facility]   Aspirin Other (See Comments)    Gastroesophageal reflux and "allergic," per [facility]    Atorvastatin Other (See Comments)    Myalgias, GI upset, and "allergic," per [facility]   Statins Other (See Comments)    Muscle pain- "allergic," per [facility]    Patient Measurements: Height: 5\' 11"  (180.3 cm) Weight: 97 kg (213 lb 12.8 oz) IBW/kg (Calculated) : 75.3 HEPARIN DW (KG): 95  Vital Signs: Temp: 98.3 F (36.8 C) (04/05 0610) Temp Source: Oral (04/05 0610) BP: 182/87 (04/05 0610) Pulse Rate: 85 (04/05 0610)  Labs: Recent Labs    01/19/24 0444 01/20/24 0443 01/20/24 2110 01/21/24 0716  HGB 7.7* 7.6*  --  8.6*  HCT 25.9* 23.7*  --  26.3*  PLT 391 329  --  327  HEPARINUNFRC  --   --  <0.10* 0.19*  CREATININE 2.57* 2.74*  --  2.97*    Estimated Creatinine Clearance: 23.6 mL/min (A) (by C-G formula based on SCr of 2.97 mg/dL (H)).   Medical History: Past Medical History:  Diagnosis Date   Anemia    Essential hypertension 07/18/2017   Foot drop, left foot    due to back surgery in 01/2021   GERD (gastroesophageal reflux disease)    History of colonic polyps    History of hiatal hernia    many years ago   History of rheumatic fever    History of stroke 07/18/2017   Hypertriglyceridemia 07/18/2017   S/P TAVR (transcatheter aortic valve replacement) 01/26/2022   [name] 29mm via TF approach with Dr. Lynnette Caffey and Dr. Laneta Simmers   Severe aortic stenosis    Type 2 diabetes mellitus with diabetic neuropathy, unspecified (HCC) 07/18/2017    Medications:  Medications Prior to Admission  Medication Sig Dispense Refill Last Dose/Taking   acetaminophen (TYLENOL) 325 MG tablet Take 650 mg by mouth every 6 (six) hours as  needed for mild pain or headache.   01/15/2024 Morning   aspirin EC 81 MG tablet Take 1 tablet (81 mg total) by mouth daily. Swallow whole. 90 tablet 3 01/14/2024 Morning   B Complex Vitamins (B COMPLEX PO) Take 1 tablet by mouth daily.   01/14/2024 Morning   Cholecalciferol (VITAMIN D3 SUPER STRENGTH) 50 MCG (2000 UT) CAPS Take 2,000 Units by mouth daily.   01/14/2024 Morning   escitalopram (LEXAPRO) 20 MG tablet Take 1 tablet (20 mg total) by mouth daily. 90 tablet 2 01/14/2024 Evening   etanercept (ENBREL MINI) 50 MG/ML injection Inject 1 mL (50 mg total) into the skin once a week. 12 mL 0 01/09/2024   ezetimibe (ZETIA) 10 MG tablet Take 1 tablet (10 mg total) by mouth daily. 90 tablet 3 01/14/2024 Evening   fenofibrate micronized (LOFIBRA) 200 MG capsule TAKE 1 CAPSULE DAILY BEFOREBREAKFAST 90 capsule 3 01/14/2024 Morning   finasteride (PROSCAR) 5 MG tablet Take 1 tablet (5 mg total) by mouth daily. 90 tablet 3 01/11/2024   HYDROcodone-acetaminophen (NORCO) 7.5-325 MG tablet Take 1 tablet by mouth every 6 (six) hours as needed for moderate pain (pain score 4-6). 30 tablet 0 01/14/2024 Bedtime   insulin aspart (NOVOLOG FLEXPEN) 100 UNIT/ML FlexPen Inject 0-20 Units into the skin See admin instructions. Inject 0-10 units into the skin before meals,  PER SLIDING SCALE: BGL 0-200 = give nothing, 201-250 = 2 units, 251-300 = 4 units, 301-350 = 6 units, 351-400 = 8 units, >400 = 10 units, push fluids, and repeat BGL check in 2 hours. If BGL remains >400 after 2 hours, CALL MD.   01/14/2024 Bedtime   insulin degludec (TRESIBA FLEXTOUCH) 100 UNIT/ML FlexTouch Pen Inject 20 Units into the skin daily.   01/14/2024 Bedtime   leflunomide (ARAVA) 10 MG tablet TAKE 1 TABLET DAILY 90 tablet 0 01/14/2024 Morning   lisinopril (ZESTRIL) 40 MG tablet Take 1 tablet (40 mg total) by mouth in the morning. 90 tablet 3 01/14/2024 Morning   magnesium oxide (MAG-OX) 400 MG tablet Take 400 mg by mouth daily with lunch.   01/14/2024 Evening    melatonin 5 MG TABS Take 5 mg by mouth at bedtime.   01/14/2024 Bedtime   Multiple Vitamins-Minerals (MULTIVITAMIN WITH MINERALS) tablet Take 1 tablet by mouth daily with breakfast.   01/14/2024 Morning   Omega 3 1000 MG CAPS Take 2,000 mg by mouth daily with lunch.   01/14/2024 Morning   omeprazole (PRILOSEC) 20 MG capsule Take 1 capsule (20 mg total) by mouth daily. 90 capsule 3 01/14/2024 Morning   Potassium 99 MG TABS Take 99 mg by mouth every evening.   01/14/2024 Noon   vitamin B-12 (CYANOCOBALAMIN) 1000 MCG tablet Take 1,000 mcg by mouth daily.   01/14/2024 Morning   vitamin E 180 MG (400 UNITS) capsule Take 400 Units by mouth daily.   01/14/2024 Morning   amoxicillin (AMOXIL) 500 MG tablet Take 4 tablets (2,000 mg total) by mouth as directed. 1 HOUR PRIOR TO DENTAL APPOINTMENTS (Patient not taking: Reported on 01/15/2024) 12 tablet 6 Not Taking   cefdinir (OMNICEF) 300 MG capsule Take 1 capsule (300 mg total) by mouth 2 (two) times daily for 7 days. 14 capsule 0    fluticasone (FLONASE) 50 MCG/ACT nasal spray Place 2 sprays into both nostrils daily. (Patient not taking: Reported on 01/15/2024) 16 g 6 Not Taking   HYDROcodone-acetaminophen (NORCO) 7.5-325 MG tablet Take 1 tablet by mouth every 6 (six) hours as needed for moderate pain (pain score 4-6). (Patient not taking: Reported on 01/15/2024) 30 tablet 0 Not Taking   HYDROcodone-acetaminophen (NORCO) 7.5-325 MG tablet Take 1 tablet by mouth every 6 (six) hours as needed for moderate pain (pain score 4-6). (Patient not taking: Reported on 01/15/2024) 30 tablet 0 Not Taking   ONETOUCH VERIO test strip 1 each by Other route as needed for other (Glucose).       Assessment: Pharmacy is consulted to start IV heparin in 78 yo male diagnosed with acute DVT involving the right radial veins and acute superficial vein thrombosis involving the right basilic vein and right cephalic vein. Pt not on anticoagulation PTA . There has also been concerns for GI  bleed.   Per consult, no bolus due to anemia.   Today, 01/21/24 Hgb 8.6 low but improved after transfusion on 4/4  Plt 327  Scr 2.97 mg/dl, CrCl 23 ml/min  HL this AM is 0.19, subtherapeutic on heparin 1850 uniots/hr  No line or bleeding issues per RN    Goal of Therapy:  Heparin level 0.3-0.7 units/ml Monitor platelets by anticoagulation protocol: Yes   Plan:  Increase heparin to 2000 units/hr  Obtain HL 8 hours after rate is changed  Daily CBC, HL while on heparin drip Monitor for signs and symptoms of bleeding    Adalberto Cole, PharmD, BCPS 01/21/2024 9:12  AM

## 2024-01-21 NOTE — Progress Notes (Signed)
 PROGRESS NOTE    Tommy Green  VWU:[id number] DOB: 1946/07/14 DOA: 01/15/2024 PCP: [name], DO  Chief Complaint  Patient presents with   Urinary Frequency   Fall    Brief Narrative:    78 yo male with PMH malignant melanoma, DMII, RA, GERD, hx CVA, HLD who presented with weakness, recurrent falls, and some persistent dark stools.  He had fallen about 1 week prior in the bathroom and struck his right face resulting in Tommy Green bruised eye as well.  On workup he was noted to have Hgb 6.5 g/dL and underwent 2 units PRBC on admission. Multiple imaging studies were obtained due to his falls and no traumatic injuries were noted. He was however noted to have Tommy Green right hepatic lobe lesion measuring 4.2 x 4.8 cm along with Tommy Green left lower lobe nodule measuring 6 x 7 mm. GI was also consulted due to concern for GI bleed.  He has Tommy Green known history of esophageal varices,  Schatzki ring, and gastric polyps on last EGD in Nov 2021.    He was admitted for ongoing GI evaluation and further workup regarding masses noted on scans.    Assessment & Plan:   Principal Problem:   GI bleed Active Problems:   Acute renal failure superimposed on stage 3b chronic kidney disease (HCC)   Liver mass   Lung nodule   Essential hypertension   Hyperlipidemia associated with type 2 diabetes mellitus (HCC)   Type 2 diabetes mellitus with diabetic neuropathy, unspecified (HCC)   S/P TAVR (transcatheter aortic valve replacement)   BPH associated with nocturia   Rheumatoid arteritis (HCC)   Heme positive stool   Abnormal CT of liver   Acute on chronic anemia   Hepatic abscess  Liver Lesion Liver Abscess MRI 3/31 with multilobulated, internally septated rim enhancing lesion within the inferior right lobe of the liver, hepatic segment V/VI measuring 4.5x3.7 cm.  Second small component which is inserpatble from this larger lesion and the liver capsule situated just inferiorly measuring 2/5x2.4 cm.  Abscess  vs cystic metastasis.  Small enhancing lesion in the anterior right lobe of the liver. S/p US aspiration and core biopsy 4/2 - follow pathology (reactive fibrosis and inflammation c/w abscess) Rare gram negative rods on gram stain, concerning for liver abscess (though no pus able to be aspirated yesterday) - follow final culture results (e. Coli, resistant to amp/unasyn) Currently on abx Appreciate ID consult - transition to oral abx when culture results (appears fluoroquinolone may be best option for PO), recommending 4 weeks abx with 1 refill at discharge  GI Bleed  Anemia  Iron Def Presented with Hb 6.5 on admission (was ~8-9 previously) S/p 3 units pRBC S/p EGD 4/1 -> with posterior oropharynx abnormal with congested mucosa and hematin, grade 1 esophageal varices noted proximally and in the middle of the esophagus.  No gross lesions in the distal esophagus.  Low grade narrowing schatzki ring.  5 cm hiatal hernia.  Erythematous mucosa in the stomach (biopsied).  No evidence of portal gastropathy or varices of the stomach.  Biopsies suggestive of iron pill gastritis, chronic peptic duodenitis.  GI recommending ENT evaluation (planning for outpatient follow up - follow up placed in discharge instructions), IR evaluation of hepatic lesion (s/p biopsy).  (See report)  Nausea  Vomiting Unclear cause, will monitor Antiemetics Plain films with nonobstructive bowel gas pattern If recurrent, persistent will repeat CT abd/pelvis   DVT  Will cautiously anticoagulate, trend H/H  AKI on CKD  IIIb Metabolic Acidosis Baseline creatinine 1.8-2.2 Current creatinine 2.9 Will trend Add bicarb.  IVF today.  Mechanical Falls Therapy  Posterior oropharynx abnormal with congested mucosa and hematin  ENT planning outpatient follow up  Proximal Upstream Varicose Veins Warrants additional follow up - per GI can be seen with significant congestive venous issues, r/o SVC syndrome or obstructive venous  outflow  Upper extremity US as above.  CT chest abdomen pelvis reviewed, CT head and C spine reviewed.   Lung Nodule 6x7 mm nodule in left lower lobe, warrants follow up  Rheumatoid Arthritis Enbrel chronically  E. Coli UTI On abx as above  BPH Finasteride  S/p TAVR  T2DM A1c 5.8 12/2023 SSI, basal insulin on hold  Hyperlipidemia Zetia  Hypertension Lisinopril on hold    DVT prophylaxis: SCD Code Status: full Family Communication: wife at bedside Disposition:   Status is: Inpatient Remains inpatient appropriate because: need for continued inpatient care   Consultants:  GI IR  Procedures:  Upper endoscopy US aspiration and core biopsy   Antimicrobials:  Anti-infectives (From admission, onward)    Start     Dose/Rate Route Frequency Ordered Stop   01/20/24 1030  metroNIDAZOLE (FLAGYL) tablet 500 mg        500 mg Oral Every 12 hours 01/20/24 0933     01/20/24 1000  cefTRIAXone (ROCEPHIN) 2 g in sodium chloride 0.9 % 100 mL IVPB        2 g 200 mL/hr over 30 Minutes Intravenous Every 24 hours 01/19/24 0947     01/17/24 0845  metroNIDAZOLE (FLAGYL) IVPB 500 mg  Status:  Discontinued        500 mg 100 mL/hr over 60 Minutes Intravenous 2 times daily 01/17/24 0759 01/20/24 0933   01/16/24 1000  cefTRIAXone (ROCEPHIN) 1 g in sodium chloride 0.9 % 100 mL IVPB        1 g 200 mL/hr over 30 Minutes Intravenous Every 24 hours 01/15/24 1600 01/19/24 1009   01/15/24 1000  cefTRIAXone (ROCEPHIN) 1 g in sodium chloride 0.9 % 100 mL IVPB        1 g 200 mL/hr over 30 Minutes Intravenous  Once 01/15/24 0951 01/15/24 1037       Subjective: Nausea/vomiting this AM No abd pain  Objective: Vitals:   01/20/24 1640 01/20/24 2018 01/21/24 0610 01/21/24 1124  BP: (!) 183/73 (!) 181/90 (!) 182/87 (!) 174/74  Pulse: 66 79 85   Resp: 18 17 15    Temp: 98 F (36.7 C) 97.9 F (36.6 C) 98.3 F (36.8 C)   TempSrc: Oral  Oral   SpO2:  100% 95%   Weight:      Height:         Intake/Output Summary (Last 24 hours) at 01/21/2024 1334 Last data filed at 01/21/2024 1238 Gross per 24 hour  Intake 1941.98 ml  Output 1750 ml  Net 191.98 ml   Filed Weights   01/15/24 0924  Weight: 97 kg    Examination:  General: No acute distress. Cardiovascular: RRR Lungs: unlabored Abdomen: Soft, nontender, nondistended  Neurological: Alert and oriented 3. Moves all extremities 4 with equal strength. Cranial nerves II through XII grossly intact. Extremities: No clubbing or cyanosis. No edema.   Data Reviewed: I have personally reviewed following labs and imaging studies  CBC: Recent Labs  Lab 01/17/24 0450 01/18/24 0458 01/19/24 0444 01/20/24 0443 01/21/24 0716  WBC 14.1* 12.5* 16.2* 12.9* 12.1*  NEUTROABS 10.7* 9.2* 13.5* 10.0* 8.6*  HGB 8.2*  7.7* 7.7* 7.6* 8.6*  HCT 26.7* 24.4* 25.9* 23.7* 26.3*  MCV 85.6 84.4 90.2 84.3 83.5  PLT 403* 343 391 329 327    Basic Metabolic Panel: Recent Labs  Lab 01/17/24 0450 01/18/24 0458 01/19/24 0444 01/20/24 0443 01/21/24 0716  NA 134* 136 136 133* 133*  K 3.6 3.6 3.4* 3.3* 3.4*  CL 108 111 110 107 106  CO2 15* 16* 13* 15* 14*  GLUCOSE 147* 159* 153* 157* 140*  BUN 40* 44* 43* 45* 47*  CREATININE 2.50* 2.74* 2.57* 2.74* 2.97*  CALCIUM 8.7* 8.7* 8.6* 8.3* 8.2*  MG 1.8 1.8 1.7 1.7 1.5*    GFR: Estimated Creatinine Clearance: 23.6 mL/min (Bless Lisenby) (by C-G formula based on SCr of 2.97 mg/dL (H)).  Liver Function Tests: Recent Labs  Lab 01/16/24 1013  AST 26  ALT 22  ALKPHOS 64  BILITOT 0.7  PROT 6.9  ALBUMIN 3.0*    CBG: Recent Labs  Lab 01/20/24 1127 01/20/24 1627 01/20/24 2014 01/21/24 0730 01/21/24 1112  GLUCAP 151* 128* 166* 140* 108*     Recent Results (from the past 240 hours)  Urine Culture     Status: Abnormal   Collection Time: 01/15/24  9:31 AM   Specimen: Urine, Random  Result Value Ref Range Status   Specimen Description   Final    URINE, RANDOM Performed at [facility], [address] [place], [place] [address]    Special Requests   Final    NONE Reflexed from [id number] Performed at [facility], [address] [place], [place] [address]    Culture >=100,000 COLONIES/mL ESCHERICHIA COLI (Genevia Bouldin)  Final   Report Status 01/17/2024 FINAL  Final   Organism ID, Bacteria ESCHERICHIA COLI (Abisola Carrero)  Final      Susceptibility   Escherichia coli - MIC*    AMPICILLIN >=32 RESISTANT Resistant     CEFAZOLIN >=64 RESISTANT Resistant     CEFEPIME <=0.12 SENSITIVE Sensitive     CEFTRIAXONE <=0.25 SENSITIVE Sensitive     CIPROFLOXACIN <=0.25 SENSITIVE Sensitive     GENTAMICIN <=1 SENSITIVE Sensitive     IMIPENEM <=0.25 SENSITIVE Sensitive     NITROFURANTOIN <=16 SENSITIVE Sensitive     TRIMETH/SULFA <=20 SENSITIVE Sensitive     AMPICILLIN/SULBACTAM >=32 RESISTANT Resistant     PIP/TAZO 16 SENSITIVE Sensitive ug/mL    * >=100,000 COLONIES/mL ESCHERICHIA COLI  Blood culture (routine x 2)     Status: None   Collection Time: 01/15/24 10:05 AM   Specimen: BLOOD LEFT ARM  Result Value Ref Range Status   Specimen Description   Final    BLOOD LEFT ARM Performed at [facility], [address] [place], [place] [address]    Special Requests   Final    BOTTLES DRAWN AEROBIC AND ANAEROBIC Blood Culture adequate volume Performed at [facility], [address] [place], [place] [address]    Culture   Final    NO GROWTH 5 DAYS Performed at [facility], [address] [place], [place] [address]    Report Status 01/20/2024 FINAL  Final  Blood culture (routine x 2)     Status: None   Collection Time: 01/15/24 10:05 AM   Specimen: BLOOD RIGHT ARM  Result Value Ref Range Status   Specimen Description   Final    BLOOD RIGHT ARM Performed at [facility], [address], [place], [place] [address]  Special Requests   Final    BOTTLES DRAWN AEROBIC AND ANAEROBIC Blood Culture adequate volume Performed at [facility], [address] [place], [place] [address]    Culture   Final    NO GROWTH 5 DAYS Performed at [facility], [address] [place], [place] [address]    Report Status 01/20/2024 FINAL  Final  Aerobic/Anaerobic Culture w Gram Stain (surgical/deep wound)     Status: None (Preliminary result)   Collection Time: 01/18/24  3:07 PM   Specimen: Abscess  Result Value Ref Range Status   Specimen Description   Final    ABSCESS Performed at [facility], [address], [place], [place] [address]    Special Requests   Final    NONE Performed at [facility], [address] [place], [place] [address]    Gram Stain   Final    MODERATE WBC PRESENT, PREDOMINANTLY PMN RARE GRAM NEGATIVE RODS Performed at [facility], [address] [place], [place] [address]    Culture   Final    MODERATE ESCHERICHIA COLI NO ANAEROBES ISOLATED; CULTURE IN PROGRESS FOR 5 DAYS    Report Status PENDING  Incomplete   Organism ID, Bacteria ESCHERICHIA COLI  Final      Susceptibility   Escherichia coli - MIC*    AMPICILLIN >=32 RESISTANT Resistant     CEFEPIME <=0.12 SENSITIVE Sensitive     CEFTAZIDIME <=1 SENSITIVE Sensitive     CEFTRIAXONE <=0.25 SENSITIVE Sensitive     CIPROFLOXACIN <=0.25 SENSITIVE Sensitive     GENTAMICIN <=1 SENSITIVE Sensitive     IMIPENEM <=0.25 SENSITIVE Sensitive     TRIMETH/SULFA <=20 SENSITIVE Sensitive     AMPICILLIN/SULBACTAM >=32 RESISTANT Resistant     PIP/TAZO 16 SENSITIVE Sensitive ug/mL    * MODERATE ESCHERICHIA COLI         Radiology Studies: DG Abd 1 View Result Date: 01/20/2024 CLINICAL DATA:  Nausea and vomiting. EXAM: ABDOMEN - 1 VIEW COMPARISON:  Pelvic radiograph dated 03/28/2021. FINDINGS: No bowel dilatation or evidence of obstruction. Evaluation however is limited due to body habitus. Right upper quadrant cholecystectomy clips. Extensive spinal fusion hardware. No acute osseous pathology.  IMPRESSION: Nonobstructive bowel gas pattern. Electronically Signed   By: Elgie Collard M.D.   On: 01/20/2024 18:02   ECHOCARDIOGRAM COMPLETE Result Date: 01/20/2024    ECHOCARDIOGRAM REPORT   Patient Name:   Tommy Green Date of Exam: 01/20/2024 Medical Rec #:  [id number]         Height:       71.0 in Accession #:    [id number]        Weight:       213.8 lb Date of Birth:  1946-06-23          BSA:          2.169 m Patient Age:    78 years          BP:           180/85 mmHg Patient Gender: M                 HR:           70 bpm. Exam Location:  Inpatient Procedure: 2D Echo, 3D Echo, Cardiac Doppler, Color Doppler and Strain Analysis            (Both Spectral and Color Flow Doppler were utilized during  procedure). Indications:    S/P AVR  History:        Patient has prior history of Echocardiogram examinations, most                 recent 01/26/2023. Risk Factors:Hypertension, Diabetes,                 Dyslipidemia and Former Smoker.                 Aortic Valve: 29 mm Sapien prosthetic, stented (TAVR) valve is                 present in the aortic position.  Sonographer:    Karma Ganja Referring Phys: [phone] Leonora Gores CALDWELL POWELL JR  Sonographer Comments: Global longitudinal strain was attempted. IMPRESSIONS  1. Left ventricular ejection fraction, by estimation, is 55 to 60%. Left ventricular ejection fraction by 3D volume is 54 %. Left ventricular ejection fraction by 2D MOD biplane is 55.3 %. The left ventricle has normal function. The left ventricle has no regional wall motion abnormalities. There is mild concentric left ventricular hypertrophy. Left ventricular diastolic parameters are consistent with Grade I diastolic dysfunction (impaired relaxation). The average left ventricular global longitudinal strain is -13.2 %. The global longitudinal strain is abnormal.  2. Peak RV-RA gradient 23 mmHg. Right ventricular systolic function is normal. The right ventricular size is normal.  3. Left atrial size  was mild to moderately dilated.  4. The mitral valve is degenerative. Trivial mitral valve regurgitation. Mild mitral stenosis. The mean mitral valve gradient is 6.0 mmHg, but calculated MVA by VTI is 2.97 cm^2. Moderate mitral annular calcification.  5. Bioprosthetic aortic valve s/p TAVR with 29 mm Edwards Sapien THV. Mean gradient 10 mmHg, EOA 2.73 cm^2. Dimensionless index 43. Trivial perivalvular leakage.  6. The IVC was not visualized. FINDINGS  Left Ventricle: Left ventricular ejection fraction, by estimation, is 55 to 60%. Left ventricular ejection fraction by 2D MOD biplane is 55.3 %. Left ventricular ejection fraction by 3D volume is 54 %. The left ventricle has normal function. The left ventricle has no regional wall motion abnormalities. The average left ventricular global longitudinal strain is -13.2 %. Strain was performed and the global longitudinal strain is abnormal. The left ventricular internal cavity size was normal in size. There is mild concentric left ventricular hypertrophy. Left ventricular diastolic parameters are consistent with Grade I diastolic dysfunction (impaired relaxation). Right Ventricle: Peak RV-RA gradient 23 mmHg. The right ventricular size is normal. No increase in right ventricular wall thickness. Right ventricular systolic function is normal. Left Atrium: Left atrial size was mild to moderately dilated. Right Atrium: Right atrial size was normal in size. Pericardium: There is no evidence of pericardial effusion. Mitral Valve: The mitral valve is degenerative in appearance. There is moderate calcification of the mitral valve leaflet(s). Moderate mitral annular calcification. Trivial mitral valve regurgitation. Mild mitral valve stenosis. MV peak gradient, 15.1 mmHg. The mean mitral valve gradient is 6.0 mmHg. Tricuspid Valve: The tricuspid valve is normal in structure. Tricuspid valve regurgitation is trivial. Aortic Valve: Bioprosthetic aortic valve s/p TAVR with 29 mm  Edwards Sapien THV. Mean gradient 10 mmHg, EOA 2.73 cm^2. Dimensionless index 43. Trivial perivalvular leakage. The aortic valve has been repaired/replaced. Aortic valve regurgitation is trivial. Aortic valve mean gradient measures 10.0 mmHg. Aortic valve peak gradient measures 16.6 mmHg. Aortic valve area, by VTI measures 2.73 cm. There is Breawna Montenegro 29 mm Sapien prosthetic, stented (TAVR) valve present in the aortic  position. Pulmonic Valve: The pulmonic valve was normal in structure. Pulmonic valve regurgitation is trivial. Aorta: The aortic root is normal in size and structure. Venous: The IVC was not visualized. The inferior vena cava was not well visualized. IAS/Shunts: No atrial level shunt detected by color flow Doppler. Additional Comments: 3D was performed not requiring image post processing on an independent workstation and was normal.  LEFT VENTRICLE PLAX 2D                        Biplane EF (MOD) LVIDd:         4.70 cm         LV Biplane EF:   Left LVIDs:         3.90 cm                          ventricular LV PW:         1.20 cm                          ejection LV IVS:        1.50 cm                          fraction by LVOT diam:     2.65 cm                          2D MOD LV SV:         118                              biplane is LV SV Index:   54                               55.3 %. LVOT Area:     5.52 cm                                Diastology                                LV e' medial:    5.11 cm/s LV Volumes (MOD)               LV E/e' medial:  22.7 LV vol d, MOD    164.0 ml      LV e' lateral:   5.00 cm/s A2C:                           LV E/e' lateral: 23.2 LV vol d, MOD    181.0 ml A4C:                           2D Longitudinal LV vol s, MOD    75.6 ml       Strain A2C:                           2D Strain GLS   -13.2 % LV vol s, MOD    77.2 ml  Avg: A4C: LV SV MOD A2C:   88.4 ml       3D Volume EF LV SV MOD A4C:   181.0 ml      LV 3D EF:    Left LV SV MOD BP:    95.6 ml                     ventricul                                             ar                                             ejection                                             fraction                                             by 3D                                             volume is                                             54 %.                                 3D Volume EF:                                3D EF:        54 %                                LV EDV:       188 ml                                LV ESV:       86 ml                                LV SV:        102 ml RIGHT VENTRICLE RV Basal diam:  4.00 cm RV S prime:     14.30 cm/s TAPSE (M-mode): 3.0 cm LEFT ATRIUM              Index        RIGHT ATRIUM           Index  LA diam:        4.90 cm  2.26 cm/m   RA Area:     18.10 cm LA Vol (A2C):   109.0 ml 50.25 ml/m  RA Volume:   43.90 ml  20.24 ml/m LA Vol (A4C):   65.7 ml  30.29 ml/m LA Biplane Vol: 87.4 ml  40.29 ml/m  AORTIC VALVE AV Area (Vmax):    2.54 cm AV Area (Vmean):   2.37 cm AV Area (VTI):     2.73 cm AV Vmax:           204.00 cm/s AV Vmean:          145.000 cm/s AV VTI:            0.433 m AV Peak Grad:      16.6 mmHg AV Mean Grad:      10.0 mmHg LVOT Vmax:         94.10 cm/s LVOT Vmean:        62.400 cm/s LVOT VTI:          0.214 m LVOT/AV VTI ratio: 0.49 MITRAL VALVE                TRICUSPID VALVE MV Area (PHT): 4.41 cm     TR Peak grad:   22.7 mmHg MV Area VTI:   2.97 cm     TR Vmax:        238.00 cm/s MV Peak grad:  15.1 mmHg MV Mean grad:  6.0 mmHg     SHUNTS MV Vmax:       1.94 m/s     Systemic VTI:  0.21 m MV Vmean:      121.0 cm/s   Systemic Diam: 2.65 cm MV Decel Time: 172 msec MV E velocity: 116.00 cm/s MV Reshunda Strider velocity: 177.00 cm/s MV E/Shyah Cadmus ratio:  0.66 Dalton McleanMD Electronically signed by Wilfred Lacy Signature Date/Time: 01/20/2024/3:30:56 PM    Final    VAS [place] UPPER EXTREMITY VENOUS DUPLEX Result Date: 01/20/2024 UPPER VENOUS STUDY  Patient Name:  Tommy Green  Date of Exam:    01/19/2024 Medical Rec #: [id number]          Accession #:    [id number] Date of Birth: 1946/07/08           Patient Gender: M Patient Age:   5 years Exam Location:  [facility] Procedure:      VAS [place] UPPER EXTREMITY VENOUS DUPLEX Referring Phys: Armenta Erskin POWELL JR --------------------------------------------------------------------------------  Indications: Edema, and Recent fall/injury. Other Indications: H/O CVA. Comparison Study: No prior exam. Performing Technologist: Fernande Bras  Examination Guidelines: Ollin Hochmuth complete evaluation includes B-mode imaging, spectral Doppler, color Doppler, and power Doppler as needed of all accessible portions of each vessel. Bilateral testing is considered an integral part of Jenniefer Salak complete examination. Limited examinations for reoccurring indications may be performed as noted.  Right Findings: +----------+------------+---------+-----------+----------+-------+ RIGHT     CompressiblePhasicitySpontaneousPropertiesSummary +----------+------------+---------+-----------+----------+-------+ IJV           Full       Yes       Yes                      +----------+------------+---------+-----------+----------+-------+ Subclavian    Full       Yes       Yes                      +----------+------------+---------+-----------+----------+-------+ Axillary  Full       Yes       Yes                      +----------+------------+---------+-----------+----------+-------+ Brachial      Full       Yes       Yes                      +----------+------------+---------+-----------+----------+-------+ Radial      Partial      Yes       Yes                      +----------+------------+---------+-----------+----------+-------+ Ulnar         Full                                          +----------+------------+---------+-----------+----------+-------+ Cephalic    Partial      No        No                        +----------+------------+---------+-----------+----------+-------+ Basilic     Partial      Yes       Yes               At [facility]  +----------+------------+---------+-----------+----------+-------+ Thrombus noted in the basilic vein at the antecubital fossa, in the proximal segment of one of the paired radial veins, and in the cephalic vein in the mid forearm at the IV placement. Cystic structure noted in the right shoulder measuring 4.1 x 0.94 x 1.7 cm  Left Findings: +----------+------------+---------+-----------+----------+-------+ LEFT      CompressiblePhasicitySpontaneousPropertiesSummary +----------+------------+---------+-----------+----------+-------+ IJV           Full       Yes       Yes                      +----------+------------+---------+-----------+----------+-------+ Subclavian    Full       Yes       Yes                      +----------+------------+---------+-----------+----------+-------+ Axillary      Full       Yes       Yes                      +----------+------------+---------+-----------+----------+-------+ Brachial      Full       Yes       Yes                      +----------+------------+---------+-----------+----------+-------+ Radial        Full                                          +----------+------------+---------+-----------+----------+-------+ Ulnar         Full                                          +----------+------------+---------+-----------+----------+-------+ Cephalic      Full  Yes       Yes                      +----------+------------+---------+-----------+----------+-------+ Basilic       Full       Yes       Yes                      +----------+------------+---------+-----------+----------+-------+  Summary:  Right: Findings consistent with acute deep vein thrombosis involving the right radial veins. Findings consistent with acute superficial vein thrombosis involving the right basilic vein and  right cephalic vein.  Left: No evidence of deep vein thrombosis in the upper extremity. No evidence of superficial vein thrombosis in the upper extremity.  *See table(s) above for measurements and observations.  Diagnosing physician: Heath Lark Electronically signed by Heath Lark on 01/20/2024 at 1:10:44 PM.    Final         Scheduled Meds:  sodium chloride   Intravenous Once   amLODipine  5 mg Oral Daily   escitalopram  20 mg Oral Daily   ezetimibe  10 mg Oral QPM   fenofibrate  54 mg Oral Daily   finasteride  5 mg Oral Daily   insulin aspart  0-15 Units Subcutaneous TID WC   insulin aspart  0-5 Units Subcutaneous QHS   melatonin  5 mg Oral QHS   metroNIDAZOLE  500 mg Oral Q12H   pantoprazole  40 mg Oral Daily   polyethylene glycol  17 g Oral BID   QUEtiapine  25 mg Oral QHS   Continuous Infusions:  cefTRIAXone (ROCEPHIN)  IV 2 g (01/21/24 0925)   heparin 2,000 Units/hr (01/21/24 0927)   lactated ringers 100 mL/hr at 01/21/24 1132   promethazine (PHENERGAN) injection (IM or IVPB) 6.25 mg (01/20/24 1157)     LOS: 6 days    Time spent: over 30 min    Lacretia Nicks, MD [facility]   To contact the attending provider between 7A-7P or the covering provider during after hours 7P-7A, please log into the web site [email or web address] and access using universal [place] password for that web site. If you do not have the password, please call the hospital operator.  01/21/2024, 1:34 PM

## 2024-01-21 NOTE — Evaluation (Signed)
 Physical Therapy Evaluation Patient Details Name: Tommy Green MRN: [id number] DOB: 06-29-1946 Today's Date: 01/21/2024  History of Present Illness  78 yo with progressive decline in subjective health; GI bleed (Hgb 6.1 on admission) and recent fall. Imaging revealed liver with lesions concerning for liver mets - workup underway. RUE DVT 01/19/24.  PMH: AS s/p tavr 01/2022, RA, gerd, dm2, cva, malignant melanoma, iron deficiency anemia, esophageal varices/portal hypertensive gastropathy, recent possible uti.  Clinical Impression  Pt admitted with above diagnosis. Pt ambulated 92' with RW, no loss of balance. He requires mod assist for transfers. He was oriented to self only this morning, not to place/situation/year, he stated he's confused. Patient will benefit from continued inpatient follow up therapy, <3 hours/day.  Pt currently with functional limitations due to the deficits listed below (see PT Problem List). Pt will benefit from acute skilled PT to increase their independence and safety with mobility to allow discharge.           If plan is discharge home, recommend the following: A lot of help with walking and/or transfers;A lot of help with bathing/dressing/bathroom;Assistance with cooking/housework;Assist for transportation;Help with stairs or ramp for entrance   Can travel by private vehicle   No    Equipment Recommendations None recommended by PT  Recommendations for Other Services       Functional Status Assessment Patient has had a recent decline in their functional status and demonstrates the ability to make significant improvements in function in a reasonable and predictable amount of time.     Precautions / Restrictions Precautions Precautions: Fall Restrictions Weight Bearing Restrictions Per Provider Order: No      Mobility  Bed Mobility               General bed mobility comments: up in recliner    Transfers Overall transfer level: Needs  assistance Equipment used: Rolling walker (2 wheels) Transfers: Sit to/from Stand Sit to Stand: +2 safety/equipment, Mod assist   Step pivot transfers: Min assist       General transfer comment: assist to power up, VCs hand placement, pt incontinent of stool in standing, pivoted to recliner    Ambulation/Gait Ambulation/Gait assistance: Contact guard assist Gait Distance (Feet): 60 Feet Assistive device: Rolling walker (2 wheels) Gait Pattern/deviations: Step-through pattern, Decreased stride length, Trunk flexed Gait velocity: decr     General Gait Details: Steady, no loss of balance, distance limited by fatigue  Stairs            Wheelchair Mobility     Tilt Bed    Modified Rankin (Stroke Patients Only)       Balance Overall balance assessment: History of Falls, Needs assistance Sitting-balance support: Feet supported Sitting balance-Leahy Scale: Fair     Standing balance support: Bilateral upper extremity supported, Reliant on assistive device for balance, During functional activity Standing balance-Leahy Scale: Poor                               Pertinent Vitals/Pain Pain Assessment Pain Score: 0-No pain    Home Living Family/patient expects to be discharged to:: Private residence Living Arrangements: Spouse/significant other Available Help at Discharge: Family;Available 24 hours/day Type of Home: House Home Access: Stairs to enter Entrance Stairs-Rails: Right Entrance Stairs-Number of Steps: 3   Home Layout: One level Home Equipment: Rolling Walker (2 wheels);BSC/3in1;Tub bench;Grab bars - tub/shower;Lift chair      Prior Function Prior Level of Function :  Needs assist;History of Falls (last six months)       Physical Assist : ADLs (physical)   ADLs (physical): Bathing;Dressing;IADLs Mobility Comments: RW for gait       Extremity/Trunk Assessment   Upper Extremity Assessment Upper Extremity Assessment: Defer to OT  evaluation LUE Deficits / Details: "sore" lateral part of upper arm however ROM WFL    Lower Extremity Assessment Lower Extremity Assessment: Generalized weakness;LLE deficits/detail;RLE deficits/detail RLE Deficits / Details: knee ext 4/5 RLE Sensation: decreased light touch LLE Deficits / Details: H/o L foot drop 2* back surgery; knee ext 4/5 LLE Sensation: decreased light touch    Cervical / Trunk Assessment Cervical / Trunk Assessment: Other exceptions;Kyphotic (hx of back surgery with L foot drop; forward head)  Communication   Communication Communication: No apparent difficulties    Cognition Arousal: Alert Behavior During Therapy: WFL for tasks assessed/performed   PT - Cognitive impairments: Memory                       PT - Cognition Comments: pt reports feeling confused this morning, He was oriented to self but not to location/situation, stated year is 2026 Following commands: Intact       Cueing       General Comments      Exercises     Assessment/Plan    PT Assessment Patient needs continued PT services  PT Problem List Decreased activity tolerance;Decreased mobility;Decreased balance;Decreased strength       PT Treatment Interventions Gait training;Therapeutic activities;Therapeutic exercise;Functional mobility training;Patient/family education    PT Goals (Current goals can be found in the Care Plan section)  Acute Rehab PT Goals Patient Stated Goal: to get strong enough to go home PT Goal Formulation: With patient/family Time For Goal Achievement: 02/04/24 Potential to Achieve Goals: Good    Frequency Min 3X/week     Co-evaluation               AM-PAC PT "6 Clicks" Mobility  Outcome Measure Help needed turning from your back to your side while in a flat bed without using bedrails?: A Lot Help needed moving from lying on your back to sitting on the side of a flat bed without using bedrails?: A Lot Help needed moving to and  from a bed to a chair (including a wheelchair)?: A Lot Help needed standing up from a chair using your arms (e.g., wheelchair or bedside chair)?: A Lot Help needed to walk in hospital room?: A Little Help needed climbing 3-5 steps with a railing? : A Lot 6 Click Score: 13    End of Session Equipment Utilized During Treatment: Gait belt Activity Tolerance: Patient tolerated treatment well Patient left: in chair;with chair alarm set;with call bell/phone within reach;with family/visitor present Nurse Communication: Mobility status PT Visit Diagnosis: Unsteadiness on feet (R26.81);Difficulty in walking, not elsewhere classified (R26.2)    Time: 4098-1191 PT Time Calculation (min) (ACUTE ONLY): 25 min   Charges:   PT Evaluation $PT Eval Moderate Complexity: 1 Mod PT Treatments $Gait Training: 8-22 mins PT General Charges $$ ACUTE PT VISIT: 1 Visit        Tamala Ser PT 01/21/2024  Acute Rehabilitation Services  Office [phone]

## 2024-01-22 ENCOUNTER — Encounter: Payer: Self-pay | Admitting: Gastroenterology

## 2024-01-22 DIAGNOSIS — K750 Abscess of liver: Secondary | ICD-10-CM | POA: Diagnosis not present

## 2024-01-22 LAB — GLUCOSE, CAPILLARY
Glucose-Capillary: 155 mg/dL — ABNORMAL HIGH (ref 70–99)
Glucose-Capillary: 205 mg/dL — ABNORMAL HIGH (ref 70–99)
Glucose-Capillary: 142 mg/dL — ABNORMAL HIGH (ref 70–99)
Glucose-Capillary: 105 mg/dL — ABNORMAL HIGH (ref 70–99)

## 2024-01-22 LAB — MAGNESIUM: Magnesium: 1.6 mg/dL — ABNORMAL LOW (ref 1.7–2.4)

## 2024-01-22 LAB — COMPREHENSIVE METABOLIC PANEL WITH GFR
ALT: 16 U/L (ref 0–44)
AST: 15 U/L (ref 15–41)
Albumin: 2.4 g/dL — ABNORMAL LOW (ref 3.5–5.0)
Alkaline Phosphatase: 49 U/L (ref 38–126)
Anion gap: 9 (ref 5–15)
BUN: 43 mg/dL — ABNORMAL HIGH (ref 8–23)
CO2: 16 mmol/L — ABNORMAL LOW (ref 22–32)
Calcium: 8.2 mg/dL — ABNORMAL LOW (ref 8.9–10.3)
Chloride: 108 mmol/L (ref 98–111)
Creatinine, Ser: 2.97 mg/dL — ABNORMAL HIGH (ref 0.61–1.24)
GFR, Estimated: 21 mL/min — ABNORMAL LOW (ref >60)
Glucose, Bld: 166 mg/dL — ABNORMAL HIGH (ref 70–99)
Potassium: 3.3 mmol/L — ABNORMAL LOW (ref 3.5–5.1)
Sodium: 133 mmol/L — ABNORMAL LOW (ref 135–145)
Total Bilirubin: 0.4 mg/dL (ref 0.0–1.2)
Total Protein: 5.9 g/dL — ABNORMAL LOW (ref 6.5–8.1)

## 2024-01-22 LAB — CBC
HCT: 28.5 % — ABNORMAL LOW (ref 39.0–52.0)
Hemoglobin: 9.2 g/dL — ABNORMAL LOW (ref 13.0–17.0)
MCH: 27.1 pg (ref 26.0–34.0)
MCHC: 32.3 g/dL (ref 30.0–36.0)
MCV: 84.1 fL (ref 80.0–100.0)
Platelets: 311 10*3/uL (ref 150–400)
RBC: 3.39 MIL/uL — ABNORMAL LOW (ref 4.22–5.81)
RDW: 17.5 % — ABNORMAL HIGH (ref 11.5–15.5)
WBC: 11.5 10*3/uL — ABNORMAL HIGH (ref 4.0–10.5)
nRBC: 0 % (ref 0.0–0.2)

## 2024-01-22 LAB — HEPARIN LEVEL (UNFRACTIONATED): Heparin Unfractionated: 0.43 [IU]/mL (ref 0.30–0.70)

## 2024-01-22 LAB — PHOSPHORUS: Phosphorus: 3.7 mg/dL (ref 2.5–4.6)

## 2024-01-22 MED ORDER — POLYETHYLENE GLYCOL 3350 17 G PO PACK: 17.0000 g | PACK | Freq: Every day | ORAL | Status: DC | PRN

## 2024-01-22 MED ORDER — MAGNESIUM OXIDE -MG SUPPLEMENT 400 (240 MG) MG PO TABS
400.0000 mg | ORAL_TABLET | Freq: Every day | ORAL | Status: DC
  Administered 2024-01-22 – 2024-01-26 (×5): 400 mg via ORAL
  Filled 2024-01-22 (×5): qty 1

## 2024-01-22 MED ORDER — SODIUM BICARBONATE 650 MG PO TABS
650.0000 mg | ORAL_TABLET | Freq: Three times a day (TID) | ORAL | Status: DC
  Administered 2024-01-22 – 2024-01-25 (×12): 650 mg via ORAL
  Filled 2024-01-22 (×12): qty 1

## 2024-01-22 MED ORDER — TRAZODONE HCL 50 MG PO TABS
25.0000 mg | ORAL_TABLET | Freq: Every day | ORAL | Status: DC
  Administered 2024-01-22 – 2024-01-25 (×4): 25 mg via ORAL
  Filled 2024-01-22 (×4): qty 1

## 2024-01-22 MED ORDER — AMLODIPINE BESYLATE 10 MG PO TABS
10.0000 mg | ORAL_TABLET | Freq: Every day | ORAL | Status: DC
  Administered 2024-01-23 – 2024-01-26 (×4): 10 mg via ORAL
  Filled 2024-01-22 (×4): qty 1

## 2024-01-22 MED ORDER — METOPROLOL TARTRATE 12.5 MG HALF TABLET
12.5000 mg | ORAL_TABLET | Freq: Two times a day (BID) | ORAL | Status: DC
  Administered 2024-01-22 – 2024-01-26 (×9): 12.5 mg via ORAL
  Filled 2024-01-22 (×9): qty 1

## 2024-01-22 NOTE — Progress Notes (Signed)
 EKG WNL> Dr. Lowell Guitar aware.

## 2024-01-22 NOTE — Progress Notes (Signed)
 Dr. Lowell Guitar aware that pt was showing contiguous PVC's on the monitor. HR elevated 120-130's. MD ordered EKG. VS taken with HR found back down in 70's. See EPIC for further orders entered by MD.

## 2024-01-22 NOTE — Plan of Care (Signed)

## 2024-01-22 NOTE — Progress Notes (Signed)
 PHARMACY - ANTICOAGULATION CONSULT NOTE  Pharmacy Consult for IV heparin  Indication: VTE  Allergies  Allergen Reactions   Canagliflozin Other (See Comments)    Pt reports that the use of Invokana caused acute KIDNEY FAILURE ([facility] records) and "allergic," per [facility]   Aspirin Other (See Comments)    Gastroesophageal reflux and "allergic," per [facility]    Atorvastatin Other (See Comments)    Myalgias, GI upset, and "allergic," per [facility]   Statins Other (See Comments)    Muscle pain- "allergic," per [facility]    Patient Measurements: Height: 5\' 11"  (180.3 cm) Weight: 97 kg (213 lb 12.8 oz) IBW/kg (Calculated) : 75.3 HEPARIN DW (KG): 95  Vital Signs: Temp: 98 F (36.7 C) (04/05 2138) BP: 183/61 (04/05 2138) Pulse Rate: 83 (04/05 2138)  Labs: Recent Labs    01/19/24 0444 01/20/24 0443 01/20/24 2110 01/21/24 0716 01/21/24 1752 01/22/24 0244  HGB 7.7* 7.6*  --  8.6*  --  9.2*  HCT 25.9* 23.7*  --  26.3*  --  28.5*  PLT 391 329  --  327  --  311  HEPARINUNFRC  --   --    < > 0.19* 0.32 0.43  CREATININE 2.57* 2.74*  --  2.97*  --   --    < > = values in this interval not displayed.    Estimated Creatinine Clearance: 23.6 mL/min (A) (by C-G formula based on SCr of 2.97 mg/dL (H)).   Medical History: Past Medical History:  Diagnosis Date   Anemia    Essential hypertension 07/18/2017   Foot drop, left foot    due to back surgery in 01/2021   GERD (gastroesophageal reflux disease)    History of colonic polyps    History of hiatal hernia    many years ago   History of rheumatic fever    History of stroke 07/18/2017   Hypertriglyceridemia 07/18/2017   S/P TAVR (transcatheter aortic valve replacement) 01/26/2022   [name] 29mm via TF approach with Dr. Lynnette Caffey and Dr. Laneta Simmers   Severe aortic stenosis    Type 2 diabetes mellitus with diabetic neuropathy, unspecified (HCC) 07/18/2017    Medications:  Medications Prior to Admission  Medication Sig Dispense Refill Last  Dose/Taking   acetaminophen (TYLENOL) 325 MG tablet Take 650 mg by mouth every 6 (six) hours as needed for mild pain or headache.   01/15/2024 Morning   aspirin EC 81 MG tablet Take 1 tablet (81 mg total) by mouth daily. Swallow whole. 90 tablet 3 01/14/2024 Morning   B Complex Vitamins (B COMPLEX PO) Take 1 tablet by mouth daily.   01/14/2024 Morning   Cholecalciferol (VITAMIN D3 SUPER STRENGTH) 50 MCG (2000 UT) CAPS Take 2,000 Units by mouth daily.   01/14/2024 Morning   escitalopram (LEXAPRO) 20 MG tablet Take 1 tablet (20 mg total) by mouth daily. 90 tablet 2 01/14/2024 Evening   etanercept (ENBREL MINI) 50 MG/ML injection Inject 1 mL (50 mg total) into the skin once a week. 12 mL 0 01/09/2024   ezetimibe (ZETIA) 10 MG tablet Take 1 tablet (10 mg total) by mouth daily. 90 tablet 3 01/14/2024 Evening   fenofibrate micronized (LOFIBRA) 200 MG capsule TAKE 1 CAPSULE DAILY BEFOREBREAKFAST 90 capsule 3 01/14/2024 Morning   finasteride (PROSCAR) 5 MG tablet Take 1 tablet (5 mg total) by mouth daily. 90 tablet 3 01/11/2024   HYDROcodone-acetaminophen (NORCO) 7.5-325 MG tablet Take 1 tablet by mouth every 6 (six) hours as needed for moderate pain (pain  score 4-6). 30 tablet 0 01/14/2024 Bedtime   insulin aspart (NOVOLOG FLEXPEN) 100 UNIT/ML FlexPen Inject 0-20 Units into the skin See admin instructions. Inject 0-10 units into the skin before meals, PER SLIDING SCALE: BGL 0-200 = give nothing, 201-250 = 2 units, 251-300 = 4 units, 301-350 = 6 units, 351-400 = 8 units, >400 = 10 units, push fluids, and repeat BGL check in 2 hours. If BGL remains >400 after 2 hours, CALL MD.   01/14/2024 Bedtime   insulin degludec (TRESIBA FLEXTOUCH) 100 UNIT/ML FlexTouch Pen Inject 20 Units into the skin daily.   01/14/2024 Bedtime   leflunomide (ARAVA) 10 MG tablet TAKE 1 TABLET DAILY 90 tablet 0 01/14/2024 Morning   lisinopril (ZESTRIL) 40 MG tablet Take 1 tablet (40 mg total) by mouth in the morning. 90 tablet 3 01/14/2024 Morning    magnesium oxide (MAG-OX) 400 MG tablet Take 400 mg by mouth daily with lunch.   01/14/2024 Evening   melatonin 5 MG TABS Take 5 mg by mouth at bedtime.   01/14/2024 Bedtime   Multiple Vitamins-Minerals (MULTIVITAMIN WITH MINERALS) tablet Take 1 tablet by mouth daily with breakfast.   01/14/2024 Morning   Omega 3 1000 MG CAPS Take 2,000 mg by mouth daily with lunch.   01/14/2024 Morning   omeprazole (PRILOSEC) 20 MG capsule Take 1 capsule (20 mg total) by mouth daily. 90 capsule 3 01/14/2024 Morning   Potassium 99 MG TABS Take 99 mg by mouth every evening.   01/14/2024 Noon   vitamin B-12 (CYANOCOBALAMIN) 1000 MCG tablet Take 1,000 mcg by mouth daily.   01/14/2024 Morning   vitamin E 180 MG (400 UNITS) capsule Take 400 Units by mouth daily.   01/14/2024 Morning   amoxicillin (AMOXIL) 500 MG tablet Take 4 tablets (2,000 mg total) by mouth as directed. 1 HOUR PRIOR TO DENTAL APPOINTMENTS (Patient not taking: Reported on 01/15/2024) 12 tablet 6 Not Taking   [EXPIRED] cefdinir (OMNICEF) 300 MG capsule Take 1 capsule (300 mg total) by mouth 2 (two) times daily for 7 days. 14 capsule 0    fluticasone (FLONASE) 50 MCG/ACT nasal spray Place 2 sprays into both nostrils daily. (Patient not taking: Reported on 01/15/2024) 16 g 6 Not Taking   HYDROcodone-acetaminophen (NORCO) 7.5-325 MG tablet Take 1 tablet by mouth every 6 (six) hours as needed for moderate pain (pain score 4-6). (Patient not taking: Reported on 01/15/2024) 30 tablet 0 Not Taking   HYDROcodone-acetaminophen (NORCO) 7.5-325 MG tablet Take 1 tablet by mouth every 6 (six) hours as needed for moderate pain (pain score 4-6). (Patient not taking: Reported on 01/15/2024) 30 tablet 0 Not Taking   ONETOUCH VERIO test strip 1 each by Other route as needed for other (Glucose).       Assessment: Pharmacy is consulted to start IV heparin in 78 yo male diagnosed with acute DVT involving the right radial veins and acute superficial vein thrombosis involving the right  basilic vein and right cephalic vein. Pt not on anticoagulation PTA . There has also been concerns for GI bleed.   Per consult, no bolus due to anemia.   Today, 01/22/24 Confirmatory HL 0.43 on 2000 units/hr Hgb 9.2, plts WNL No complications of therapy noted  Goal of Therapy:  Heparin level 0.3-0.7 units/ml Monitor platelets by anticoagulation protocol: Yes   Plan:  Continue heparin infusion at 2000 units/hr  Daily CBC, heparin level while on heparin drip Monitor for signs and symptoms of bleeding   Arley Phenix RPh 01/22/2024, 3:03  AM

## 2024-01-22 NOTE — Progress Notes (Signed)
 Mobility Specialist - Progress Note   01/22/24 0839  Mobility  Activity Transferred from bed to chair  Level of Assistance Minimal assist, patient does 75% or more  Assistive Device Stedy  Activity Response Tolerated well  Mobility Referral Yes  Mobility visit 1 Mobility  Mobility Specialist Start Time (ACUTE ONLY) 0801  Mobility Specialist Stop Time (ACUTE ONLY) 0818  Mobility Specialist Time Calculation (min) (ACUTE ONLY) 17 min   Pt received in bed and agreeable to transfer to the recliner. Pt was minA from supine>sitting & able to self rise on stedy. No complaints during session. Mittens placed back on at EOS. Pt to recliner after session with all needs met. NT in room.    [facility]

## 2024-01-22 NOTE — Progress Notes (Addendum)
 PROGRESS NOTE    Tommy Green  ZOX:[id number] DOB: 1946/06/30 DOA: 01/15/2024 PCP: [name], DO  Chief Complaint  Patient presents with   Urinary Frequency   Fall    Brief Narrative:    78 yo male with PMH malignant melanoma, DMII, RA, GERD, hx CVA, HLD who presented with weakness, recurrent falls, and some persistent dark stools.  He had fallen about 1 week prior in the bathroom and struck his right face resulting in Tommy Green bruised eye as well.  On workup he was noted to have Hgb 6.5 g/dL and underwent 2 units PRBC on admission. Multiple imaging studies were obtained due to his falls and no traumatic injuries were noted. He was however noted to have Tommy Green right hepatic lobe lesion measuring 4.2 x 4.8 cm along with Tommy Green left lower lobe nodule measuring 6 x 7 mm. GI was also consulted due to concern for GI bleed.  He has Tommy Green known history of esophageal varices,  Schatzki ring, and gastric polyps on last EGD in Nov 2021.    He was admitted for ongoing GI evaluation and further workup regarding masses noted on scans.    Assessment & Plan:   Principal Problem:   GI bleed Active Problems:   Acute renal failure superimposed on stage 3b chronic kidney disease (HCC)   Liver mass   Lung nodule   Essential hypertension   Hyperlipidemia associated with type 2 diabetes mellitus (HCC)   Type 2 diabetes mellitus with diabetic neuropathy, unspecified (HCC)   S/P TAVR (transcatheter aortic valve replacement)   BPH associated with nocturia   Rheumatoid arteritis (HCC)   Heme positive stool   Abnormal CT of liver   Acute on chronic anemia   Hepatic abscess  Goals of Care Wife trying to decide whether he should go to SNF vs home with Tommy Green.  She notes he does have Tommy Green DNR.    Liver Lesion Liver Abscess MRI 3/31 with multilobulated, internally septated rim enhancing lesion within the inferior right lobe of the liver, hepatic segment V/VI measuring 4.5x3.7 cm.  Second small component which  is inserpatble from this larger lesion and the liver capsule situated just inferiorly measuring 2/5x2.4 cm.  Abscess vs cystic metastasis.  Small enhancing lesion in the anterior right lobe of the liver. S/p US aspiration and core biopsy 4/2 - follow pathology (reactive fibrosis and inflammation c/w abscess) Rare gram negative rods on gram stain, concerning for liver abscess (though no pus able to be aspirated yesterday) - follow final culture results (e. Coli, resistant to amp/unasyn) Currently on abx Appreciate ID consult - transition to oral abx when culture results (appears fluoroquinolone may be best option for PO), recommending 4 weeks abx with 1 refill at discharge  GI Bleed  Anemia  Iron Def Presented with Hb 6.5 on admission (was ~8-9 previously) S/p 3 units pRBC S/p EGD 4/1 -> with posterior oropharynx abnormal with congested mucosa and hematin, grade 1 esophageal varices noted proximally and in the middle of the esophagus.  No gross lesions in the distal esophagus.  Low grade narrowing schatzki ring.  5 cm hiatal hernia.  Erythematous mucosa in the stomach (biopsied).  No evidence of portal gastropathy or varices of the stomach.  Biopsies suggestive of iron pill gastritis, chronic peptic duodenitis.  GI recommending ENT evaluation (planning for outpatient follow up - follow up placed in discharge instructions), IR evaluation of hepatic lesion (s/p biopsy).  (See report)  Nausea  Vomiting Unclear cause, will monitor  Antiemetics Plain films with nonobstructive bowel gas pattern If recurrent, persistent will repeat CT abd/pelvis   DVT  Will cautiously anticoagulate, trend H/H  AKI on CKD IIIb Metabolic Acidosis Baseline creatinine 1.8-2.2 Current creatinine 2.9 Will trend Add bicarb.  IVF today.  Cognitive Deficit ? Memory issues over the past few years, she notes he's sundowned at times in the hospital, doesn't do well on seroquel.  Outpatient neurology follow  up  Mechanical Falls Therapy  Posterior oropharynx abnormal with congested mucosa and hematin  ENT planning outpatient follow up  Proximal Upstream Varicose Veins Unclear cause.  Warrants additional follow up - per GI can be seen with significant congestive venous issues, r/o SVC syndrome or obstructive venous outflow  Upper extremity US as above.  CT chest abdomen pelvis reviewed, CT head and C spine reviewed.   Lung Nodule 6x7 mm nodule in left lower lobe, warrants follow up  Rheumatoid Arthritis Enbrel chronically  E. Coli UTI On abx as above  BPH Finasteride  S/p TAVR  T2DM A1c 5.8 12/2023 SSI, basal insulin on hold  Hyperlipidemia Zetia  Hypertension Lisinopril on hold BP elevated, start amlodipine    DVT prophylaxis: SCD Code Status: full -> transitioned to DNR today after discussion with wife, he had Tommy Green DNR Family Communication: wife at bedside Disposition:   Status is: Inpatient Remains inpatient appropriate because: need for continued inpatient care   Consultants:  GI IR  Procedures:  Upper endoscopy US aspiration and core biopsy   Antimicrobials:  Anti-infectives (From admission, onward)    Start     Dose/Rate Route Frequency Ordered Stop   01/20/24 1030  metroNIDAZOLE (FLAGYL) tablet 500 mg  Status:  Discontinued        500 mg Oral Every 12 hours 01/20/24 0933 01/21/24 1404   01/20/24 1000  cefTRIAXone (ROCEPHIN) 2 g in sodium chloride 0.9 % 100 mL IVPB        2 g 200 mL/hr over 30 Minutes Intravenous Every 24 hours 01/19/24 0947     01/17/24 0845  metroNIDAZOLE (FLAGYL) IVPB 500 mg  Status:  Discontinued        500 mg 100 mL/hr over 60 Minutes Intravenous 2 times daily 01/17/24 0759 01/20/24 0933   01/16/24 1000  cefTRIAXone (ROCEPHIN) 1 g in sodium chloride 0.9 % 100 mL IVPB        1 g 200 mL/hr over 30 Minutes Intravenous Every 24 hours 01/15/24 1600 01/19/24 1009   01/15/24 1000  cefTRIAXone (ROCEPHIN) 1 g in sodium chloride 0.9 % 100  mL IVPB        1 g 200 mL/hr over 30 Minutes Intravenous  Once 01/15/24 0951 01/15/24 1037       Subjective: Feels better this AM  Objective: Vitals:   01/21/24 1345 01/21/24 2138 01/22/24 0407 01/22/24 0936  BP: (!) 159/82 (!) 183/61 (!) 186/83 (!) 186/83  Pulse: 81 83 75   Resp: 17 20 20    Temp: 98.3 F (36.8 C) 98 F (36.7 C) 97.9 F (36.6 C)   TempSrc:   Oral   SpO2: 100% 97% 99%   Weight:      Height:        Intake/Output Summary (Last 24 hours) at 01/22/2024 0945 Last data filed at 01/22/2024 0935 Gross per 24 hour  Intake 2412.84 ml  Output 1700 ml  Net 712.84 ml   Filed Weights   01/15/24 0924  Weight: 97 kg    Examination:  General: No acute distress. Cardiovascular:  RRR Lungs: unlabored Abdomen: distended, nontender, not tense Neurological: Alert and oriented 3. Moves all extremities 4 with equal strength. Cranial nerves II through XII grossly intact. Extremities: No clubbing or cyanosis. No edema.   Data Reviewed: I have personally reviewed following labs and imaging studies  CBC: Recent Labs  Lab 01/17/24 0450 01/18/24 0458 01/19/24 0444 01/20/24 0443 01/21/24 0716 01/22/24 0244  WBC 14.1* 12.5* 16.2* 12.9* 12.1* 11.5*  NEUTROABS 10.7* 9.2* 13.5* 10.0* 8.6*  --   HGB 8.2* 7.7* 7.7* 7.6* 8.6* 9.2*  HCT 26.7* 24.4* 25.9* 23.7* 26.3* 28.5*  MCV 85.6 84.4 90.2 84.3 83.5 84.1  PLT 403* 343 391 329 327 311    Basic Metabolic Panel: Recent Labs  Lab 01/18/24 0458 01/19/24 0444 01/20/24 0443 01/21/24 0716 01/22/24 0244  NA 136 136 133* 133* 133*  K 3.6 3.4* 3.3* 3.4* 3.3*  CL 111 110 107 106 108  CO2 16* 13* 15* 14* 16*  GLUCOSE 159* 153* 157* 140* 166*  BUN 44* 43* 45* 47* 43*  CREATININE 2.74* 2.57* 2.74* 2.97* 2.97*  CALCIUM 8.7* 8.6* 8.3* 8.2* 8.2*  MG 1.8 1.7 1.7 1.5* 1.6*  PHOS  --   --   --   --  3.7    GFR: Estimated Creatinine Clearance: 23.6 mL/min (Jaquarius Seder) (by C-G formula based on SCr of 2.97 mg/dL (H)).  Liver Function  Tests: Recent Labs  Lab 01/16/24 1013 01/22/24 0244  AST 26 15  ALT 22 16  ALKPHOS 64 49  BILITOT 0.7 0.4  PROT 6.9 5.9*  ALBUMIN 3.0* 2.4*    CBG: Recent Labs  Lab 01/21/24 0730 01/21/24 1112 01/21/24 1632 01/21/24 2136 01/22/24 0745  GLUCAP 140* 108* 149* 121* 155*     Recent Results (from the past 240 hours)  Urine Culture     Status: Abnormal   Collection Time: 01/15/24  9:31 AM   Specimen: Urine, Random  Result Value Ref Range Status   Specimen Description   Final    URINE, RANDOM Performed at [facility], [address] [place], [place] [address]    Special Requests   Final    NONE Reflexed from [id number] Performed at [facility], [address] [place], [place] [address]    Culture >=100,000 COLONIES/mL ESCHERICHIA COLI (Janellie Tennison)  Final   Report Status 01/17/2024 FINAL  Final   Organism ID, Bacteria ESCHERICHIA COLI (Zaylyn Bergdoll)  Final      Susceptibility   Escherichia coli - MIC*    AMPICILLIN >=32 RESISTANT Resistant     CEFAZOLIN >=64 RESISTANT Resistant     CEFEPIME <=0.12 SENSITIVE Sensitive     CEFTRIAXONE <=0.25 SENSITIVE Sensitive     CIPROFLOXACIN <=0.25 SENSITIVE Sensitive     GENTAMICIN <=1 SENSITIVE Sensitive     IMIPENEM <=0.25 SENSITIVE Sensitive     NITROFURANTOIN <=16 SENSITIVE Sensitive     TRIMETH/SULFA <=20 SENSITIVE Sensitive     AMPICILLIN/SULBACTAM >=32 RESISTANT Resistant     PIP/TAZO 16 SENSITIVE Sensitive ug/mL    * >=100,000 COLONIES/mL ESCHERICHIA COLI  Blood culture (routine x 2)     Status: None   Collection Time: 01/15/24 10:05 AM   Specimen: BLOOD LEFT ARM  Result Value Ref Range Status   Specimen Description   Final    BLOOD LEFT ARM Performed at [facility], [address] [place], [place] [address]    Special Requests   Final    BOTTLES DRAWN AEROBIC AND ANAEROBIC  Blood Culture adequate volume Performed at [facility], [address] [place], [place] [address]    Culture    Final    NO GROWTH 5 DAYS Performed at [facility], [address], [place], [place] [address]    Report Status 01/20/2024 FINAL  Final  Blood culture (routine x 2)     Status: None   Collection Time: 01/15/24 10:05 AM   Specimen: BLOOD RIGHT ARM  Result Value Ref Range Status   Specimen Description   Final    BLOOD RIGHT ARM Performed at [facility], [address] [place], [place] [address]    Special Requests   Final    BOTTLES DRAWN AEROBIC AND ANAEROBIC Blood Culture adequate volume Performed at [facility], [address], [place], [place] [address]    Culture   Final    NO GROWTH 5 DAYS Performed at [facility], [address] [place], [place] [address]    Report Status 01/20/2024 FINAL  Final  Aerobic/Anaerobic Culture w Gram Stain (surgical/deep wound)     Status: None (Preliminary result)   Collection Time: 01/18/24  3:07 PM   Specimen: Abscess  Result Value Ref Range Status   Specimen Description   Final    ABSCESS Performed at [facility], [address], [place], [place] [address]    Special Requests   Final    NONE Performed at [facility], [address] [place], [place] [address]    Gram Stain   Final    MODERATE WBC PRESENT, PREDOMINANTLY PMN RARE GRAM NEGATIVE RODS Performed at [facility], [address], [place], [place] [address]    Culture   Final    MODERATE ESCHERICHIA COLI NO ANAEROBES ISOLATED; CULTURE IN PROGRESS FOR 5 DAYS    Report Status PENDING  Incomplete   Organism ID, Bacteria ESCHERICHIA COLI  Final   Organism ID, Bacteria ESCHERICHIA COLI  Final      Susceptibility   Escherichia coli - MIC*    AMPICILLIN >=32 RESISTANT Resistant     CEFEPIME <=0.12 SENSITIVE Sensitive     CEFTAZIDIME <=1 SENSITIVE Sensitive     CEFTRIAXONE <=0.25 SENSITIVE Sensitive     CIPROFLOXACIN <=0.25 SENSITIVE Sensitive     GENTAMICIN <=1 SENSITIVE Sensitive      IMIPENEM <=0.25 SENSITIVE Sensitive     TRIMETH/SULFA <=20 SENSITIVE Sensitive     AMPICILLIN/SULBACTAM >=32 RESISTANT Resistant     PIP/TAZO 16 SENSITIVE Sensitive ug/mL   Escherichia coli - KIRBY BAUER*    CEFAZOLIN RESISTANT Resistant     * MODERATE ESCHERICHIA COLI    MODERATE ESCHERICHIA COLI         Radiology Studies: DG Abd 1 View Result Date: 01/20/2024 CLINICAL DATA:  Nausea and vomiting. EXAM: ABDOMEN - 1 VIEW COMPARISON:  Pelvic radiograph dated 03/28/2021. FINDINGS: No bowel dilatation or evidence of obstruction. Evaluation however is limited due to body habitus. Right upper quadrant cholecystectomy clips. Extensive spinal fusion hardware. No acute osseous pathology. IMPRESSION: Nonobstructive bowel gas pattern. Electronically Signed   By: Elgie Collard M.D.   On: 01/20/2024 18:02   ECHOCARDIOGRAM COMPLETE Result Date: 01/20/2024    ECHOCARDIOGRAM REPORT   Patient Name:   LIN GLAZIER River Date of Exam: 01/20/2024 Medical Rec #:  [id number]         Height:       71.0 in Accession #:  [id number]        Weight:       213.8 lb Date of Birth:  Mar 01, 1946          BSA:          2.169 m Patient Age:    78 years          BP:           180/85 mmHg Patient Gender: M                 HR:           70 bpm. Exam Location:  Inpatient Procedure: 2D Echo, 3D Echo, Cardiac Doppler, Color Doppler and Strain Analysis            (Both Spectral and Color Flow Doppler were utilized during            procedure). Indications:    S/P AVR  History:        Patient has prior history of Echocardiogram examinations, most                 recent 01/26/2023. Risk Factors:Hypertension, Diabetes,                 Dyslipidemia and Former Smoker.                 Aortic Valve: 29 mm Sapien prosthetic, stented (TAVR) valve is                 present in the aortic position.  Sonographer:    Karma Ganja Referring Phys: [phone] Bentley Haralson CALDWELL POWELL JR  Sonographer Comments: Global longitudinal strain was attempted. IMPRESSIONS  1.  Left ventricular ejection fraction, by estimation, is 55 to 60%. Left ventricular ejection fraction by 3D volume is 54 %. Left ventricular ejection fraction by 2D MOD biplane is 55.3 %. The left ventricle has normal function. The left ventricle has no regional wall motion abnormalities. There is mild concentric left ventricular hypertrophy. Left ventricular diastolic parameters are consistent with Grade I diastolic dysfunction (impaired relaxation). The average left ventricular global longitudinal strain is -13.2 %. The global longitudinal strain is abnormal.  2. Peak RV-RA gradient 23 mmHg. Right ventricular systolic function is normal. The right ventricular size is normal.  3. Left atrial size was mild to moderately dilated.  4. The mitral valve is degenerative. Trivial mitral valve regurgitation. Mild mitral stenosis. The mean mitral valve gradient is 6.0 mmHg, but calculated MVA by VTI is 2.97 cm^2. Moderate mitral annular calcification.  5. Bioprosthetic aortic valve s/p TAVR with 29 mm Edwards Sapien THV. Mean gradient 10 mmHg, EOA 2.73 cm^2. Dimensionless index 43. Trivial perivalvular leakage.  6. The IVC was not visualized. FINDINGS  Left Ventricle: Left ventricular ejection fraction, by estimation, is 55 to 60%. Left ventricular ejection fraction by 2D MOD biplane is 55.3 %. Left ventricular ejection fraction by 3D volume is 54 %. The left ventricle has normal function. The left ventricle has no regional wall motion abnormalities. The average left ventricular global longitudinal strain is -13.2 %. Strain was performed and the global longitudinal strain is abnormal. The left ventricular internal cavity size was normal in size. There is mild concentric left ventricular hypertrophy. Left ventricular diastolic parameters are consistent with Grade I diastolic dysfunction (impaired relaxation). Right Ventricle: Peak RV-RA gradient 23 mmHg. The right ventricular size is normal. No increase in right ventricular  wall thickness. Right ventricular systolic function is normal. Left Atrium: Left atrial size was  mild to moderately dilated. Right Atrium: Right atrial size was normal in size. Pericardium: There is no evidence of pericardial effusion. Mitral Valve: The mitral valve is degenerative in appearance. There is moderate calcification of the mitral valve leaflet(s). Moderate mitral annular calcification. Trivial mitral valve regurgitation. Mild mitral valve stenosis. MV peak gradient, 15.1 mmHg. The mean mitral valve gradient is 6.0 mmHg. Tricuspid Valve: The tricuspid valve is normal in structure. Tricuspid valve regurgitation is trivial. Aortic Valve: Bioprosthetic aortic valve s/p TAVR with 29 mm Edwards Sapien THV. Mean gradient 10 mmHg, EOA 2.73 cm^2. Dimensionless index 43. Trivial perivalvular leakage. The aortic valve has been repaired/replaced. Aortic valve regurgitation is trivial. Aortic valve mean gradient measures 10.0 mmHg. Aortic valve peak gradient measures 16.6 mmHg. Aortic valve area, by VTI measures 2.73 cm. There is Francetta Ilg 29 mm Sapien prosthetic, stented (TAVR) valve present in the aortic position. Pulmonic Valve: The pulmonic valve was normal in structure. Pulmonic valve regurgitation is trivial. Aorta: The aortic root is normal in size and structure. Venous: The IVC was not visualized. The inferior vena cava was not well visualized. IAS/Shunts: No atrial level shunt detected by color flow Doppler. Additional Comments: 3D was performed not requiring image post processing on an independent workstation and was normal.  LEFT VENTRICLE PLAX 2D                        Biplane EF (MOD) LVIDd:         4.70 cm         LV Biplane EF:   Left LVIDs:         3.90 cm                          ventricular LV PW:         1.20 cm                          ejection LV IVS:        1.50 cm                          fraction by LVOT diam:     2.65 cm                          2D MOD LV SV:         118                               biplane is LV SV Index:   54                               55.3 %. LVOT Area:     5.52 cm                                Diastology                                LV e' medial:    5.11 cm/s LV Volumes (MOD)               LV E/e'  medial:  22.7 LV vol d, MOD    164.0 ml      LV e' lateral:   5.00 cm/s A2C:                           LV E/e' lateral: 23.2 LV vol d, MOD    181.0 ml A4C:                           2D Longitudinal LV vol s, MOD    75.6 ml       Strain A2C:                           2D Strain GLS   -13.2 % LV vol s, MOD    77.2 ml       Avg: A4C: LV SV MOD A2C:   88.4 ml       3D Volume EF LV SV MOD A4C:   181.0 ml      LV 3D EF:    Left LV SV MOD BP:    95.6 ml                    ventricul                                             ar                                             ejection                                             fraction                                             by 3D                                             volume is                                             54 %.                                 3D Volume EF:                                3D EF:        54 %  LV EDV:       188 ml                                LV ESV:       86 ml                                LV SV:        102 ml RIGHT VENTRICLE RV Basal diam:  4.00 cm RV S prime:     14.30 cm/s TAPSE (M-mode): 3.0 cm LEFT ATRIUM              Index        RIGHT ATRIUM           Index LA diam:        4.90 cm  2.26 cm/m   RA Area:     18.10 cm LA Vol (A2C):   109.0 ml 50.25 ml/m  RA Volume:   43.90 ml  20.24 ml/m LA Vol (A4C):   65.7 ml  30.29 ml/m LA Biplane Vol: 87.4 ml  40.29 ml/m  AORTIC VALVE AV Area (Vmax):    2.54 cm AV Area (Vmean):   2.37 cm AV Area (VTI):     2.73 cm AV Vmax:           204.00 cm/s AV Vmean:          145.000 cm/s AV VTI:            0.433 m AV Peak Grad:      16.6 mmHg AV Mean Grad:      10.0 mmHg LVOT Vmax:         94.10 cm/s LVOT Vmean:        62.400 cm/s LVOT VTI:           0.214 m LVOT/AV VTI ratio: 0.49 MITRAL VALVE                TRICUSPID VALVE MV Area (PHT): 4.41 cm     TR Peak grad:   22.7 mmHg MV Area VTI:   2.97 cm     TR Vmax:        238.00 cm/s MV Peak grad:  15.1 mmHg MV Mean grad:  6.0 mmHg     SHUNTS MV Vmax:       1.94 m/s     Systemic VTI:  0.21 m MV Vmean:      121.0 cm/s   Systemic Diam: 2.65 cm MV Decel Time: 172 msec MV E velocity: 116.00 cm/s MV Ezabella Teska velocity: 177.00 cm/s MV E/Alaila Pillard ratio:  0.66 Dalton McleanMD Electronically signed by Wilfred Lacy Signature Date/Time: 01/20/2024/3:30:56 PM    Final         Scheduled Meds:  sodium chloride   Intravenous Once   amLODipine  5 mg Oral Daily   escitalopram  20 mg Oral Daily   ezetimibe  10 mg Oral QPM   fenofibrate  54 mg Oral Daily   finasteride  5 mg Oral Daily   insulin aspart  0-15 Units Subcutaneous TID WC   insulin aspart  0-5 Units Subcutaneous QHS   melatonin  5 mg Oral QHS   pantoprazole  40 mg Oral Daily   QUEtiapine  25 mg Oral QHS   Continuous Infusions:  cefTRIAXone (ROCEPHIN)  IV 2 g (01/21/24 0925)   heparin  2,000 Units/hr (01/22/24 0754)   lactated ringers 100 mL/hr at 01/21/24 2233   promethazine (PHENERGAN) injection (IM or IVPB) 6.25 mg (01/20/24 1157)     LOS: 7 days    Time spent: over 30 min    Lacretia Nicks, MD [facility]   To contact the attending provider between 7A-7P or the covering provider during after hours 7P-7A, please log into the web site [email or web address] and access using universal [place] password for that web site. If you do not have the password, please call the hospital operator.  01/22/2024, 9:45 AM

## 2024-01-22 NOTE — Progress Notes (Signed)
 Pt has been pulling at IV lines and male purewick. Soft mittens applied to bilateral hands. Pt continues to be disoriented to place, time, situation. He is oriented to person.

## 2024-01-23 ENCOUNTER — Ambulatory Visit: Admitting: Urology

## 2024-01-23 DIAGNOSIS — N390 Urinary tract infection, site not specified: Secondary | ICD-10-CM | POA: Diagnosis not present

## 2024-01-23 DIAGNOSIS — Z66 Do not resuscitate: Secondary | ICD-10-CM | POA: Diagnosis not present

## 2024-01-23 DIAGNOSIS — R319 Hematuria, unspecified: Secondary | ICD-10-CM | POA: Diagnosis not present

## 2024-01-23 DIAGNOSIS — Z7189 Other specified counseling: Secondary | ICD-10-CM

## 2024-01-23 DIAGNOSIS — Z515 Encounter for palliative care: Secondary | ICD-10-CM | POA: Diagnosis not present

## 2024-01-23 DIAGNOSIS — Z952 Presence of prosthetic heart valve: Secondary | ICD-10-CM

## 2024-01-23 DIAGNOSIS — R531 Weakness: Secondary | ICD-10-CM

## 2024-01-23 DIAGNOSIS — K922 Gastrointestinal hemorrhage, unspecified: Secondary | ICD-10-CM | POA: Diagnosis not present

## 2024-01-23 LAB — AEROBIC/ANAEROBIC CULTURE W GRAM STAIN (SURGICAL/DEEP WOUND)

## 2024-01-23 LAB — CBC WITH DIFFERENTIAL/PLATELET
Abs Immature Granulocytes: 0.05 10*3/uL (ref 0.00–0.07)
Basophils Absolute: 0.1 10*3/uL (ref 0.0–0.1)
Basophils Relative: 1 %
Eosinophils Absolute: 0.2 10*3/uL (ref 0.0–0.5)
Eosinophils Relative: 2 %
HCT: 30 % — ABNORMAL LOW (ref 39.0–52.0)
Hemoglobin: 9.8 g/dL — ABNORMAL LOW (ref 13.0–17.0)
Immature Granulocytes: 0 %
Lymphocytes Relative: 21 %
Lymphs Abs: 2.4 10*3/uL (ref 0.7–4.0)
MCH: 27.3 pg (ref 26.0–34.0)
MCHC: 32.7 g/dL (ref 30.0–36.0)
MCV: 83.6 fL (ref 80.0–100.0)
Monocytes Absolute: 0.7 10*3/uL (ref 0.1–1.0)
Monocytes Relative: 6 %
Neutro Abs: 8.1 10*3/uL — ABNORMAL HIGH (ref 1.7–7.7)
Neutrophils Relative %: 70 %
Platelets: 316 10*3/uL (ref 150–400)
RBC: 3.59 MIL/uL — ABNORMAL LOW (ref 4.22–5.81)
RDW: 17.7 % — ABNORMAL HIGH (ref 11.5–15.5)
WBC: 11.5 10*3/uL — ABNORMAL HIGH (ref 4.0–10.5)
nRBC: 0 % (ref 0.0–0.2)

## 2024-01-23 LAB — COMPREHENSIVE METABOLIC PANEL WITH GFR
ALT: 14 U/L (ref 0–44)
AST: 18 U/L (ref 15–41)
Albumin: 2.5 g/dL — ABNORMAL LOW (ref 3.5–5.0)
Alkaline Phosphatase: 47 U/L (ref 38–126)
Anion gap: 11 (ref 5–15)
BUN: 41 mg/dL — ABNORMAL HIGH (ref 8–23)
CO2: 17 mmol/L — ABNORMAL LOW (ref 22–32)
Calcium: 8.3 mg/dL — ABNORMAL LOW (ref 8.9–10.3)
Chloride: 108 mmol/L (ref 98–111)
Creatinine, Ser: 3.01 mg/dL — ABNORMAL HIGH (ref 0.61–1.24)
GFR, Estimated: 20 mL/min — ABNORMAL LOW (ref >60)
Glucose, Bld: 114 mg/dL — ABNORMAL HIGH (ref 70–99)
Potassium: 3.4 mmol/L — ABNORMAL LOW (ref 3.5–5.1)
Sodium: 136 mmol/L (ref 135–145)
Total Bilirubin: 0.5 mg/dL (ref 0.0–1.2)
Total Protein: 6.1 g/dL — ABNORMAL LOW (ref 6.5–8.1)

## 2024-01-23 LAB — URINALYSIS, ROUTINE W REFLEX MICROSCOPIC
Bilirubin Urine: NEGATIVE
Glucose, UA: 150 mg/dL — AB
Ketones, ur: NEGATIVE mg/dL
Nitrite: NEGATIVE
Protein, ur: 30 mg/dL — AB
Specific Gravity, Urine: 1.01 (ref 1.005–1.030)
pH: 5 (ref 5.0–8.0)

## 2024-01-23 LAB — HEPARIN LEVEL (UNFRACTIONATED): Heparin Unfractionated: 0.5 [IU]/mL (ref 0.30–0.70)

## 2024-01-23 LAB — GLUCOSE, CAPILLARY
Glucose-Capillary: 130 mg/dL — ABNORMAL HIGH (ref 70–99)
Glucose-Capillary: 151 mg/dL — ABNORMAL HIGH (ref 70–99)
Glucose-Capillary: 169 mg/dL — ABNORMAL HIGH (ref 70–99)
Glucose-Capillary: 145 mg/dL — ABNORMAL HIGH (ref 70–99)

## 2024-01-23 LAB — MAGNESIUM: Magnesium: 1.6 mg/dL — ABNORMAL LOW (ref 1.7–2.4)

## 2024-01-23 LAB — PHOSPHORUS: Phosphorus: 3.7 mg/dL (ref 2.5–4.6)

## 2024-01-23 NOTE — Plan of Care (Signed)
  Problem: Clinical Measurements: Goal: Ability to maintain clinical measurements within normal limits will improve Outcome: Progressing Goal: Will remain free from infection Outcome: Progressing Goal: Respiratory complications will improve Outcome: Progressing   Problem: Elimination: Goal: Will not experience complications related to urinary retention Outcome: Progressing   Problem: Pain Managment: Goal: General experience of comfort will improve and/or be controlled Outcome: Progressing   Problem: Safety: Goal: Ability to remain free from injury will improve Outcome: Progressing   Problem: Skin Integrity: Goal: Risk for impaired skin integrity will decrease Outcome: Progressing   Problem: Clinical Measurements: Goal: Ability to maintain clinical measurements within normal limits will improve Outcome: Progressing Goal: Will remain free from infection Outcome: Progressing Goal: Respiratory complications will improve Outcome: Progressing   Problem: Activity: Goal: Risk for activity intolerance will decrease Outcome: Progressing   Problem: Nutrition: Goal: Adequate nutrition will be maintained Outcome: Progressing   Problem: Coping: Goal: Level of anxiety will decrease Outcome: Progressing   Problem: Elimination: Goal: Will not experience complications related to urinary retention Outcome: Progressing   Problem: Pain Managment: Goal: General experience of comfort will improve and/or be controlled Outcome: Progressing   Problem: Safety: Goal: Ability to remain free from injury will improve Outcome: Progressing   Problem: Skin Integrity: Goal: Risk for impaired skin integrity will decrease Outcome: Progressing   Problem: Coping: Goal: Ability to adjust to condition or change in health will improve Outcome: Progressing   Problem: Nutritional: Goal: Maintenance of adequate nutrition will improve Outcome: Progressing   Problem: Tissue Perfusion: Goal:  Adequacy of tissue perfusion will improve Outcome: Progressing

## 2024-01-23 NOTE — Consult Note (Signed)
 Palliative Care Consult Note                                  Date: 01/23/2024   Patient Name: Tommy Green  DOB: 06-25-1946  MRN: [id number]  Age / Sex: 78 y.o., male  PCP: Jobe Mulder, DO Referring Physician: Etter Hermann., *  Reason for Consultation: Establishing goals of care  HPI/Patient Profile: 78 y.o. male  with past medical history of malignant melanoma, DMII, RA, GERD, hx CVA, HLD who presented with weakness, recurrent falls, and some persistent dark stools. He was admitted on 01/15/2024 with GI bleed liver lesion/liver abscess, iron deficiency anemia, AKI on CKD, nausea/vomiting, PVCs, cognitive deficit, and others.   Palliative medicine was consulted for GOC conversations in the current context of overall decline.  Past Medical History:  Diagnosis Date   Anemia    Essential hypertension 07/18/2017   Foot drop, left foot    due to back surgery in 01/2021   GERD (gastroesophageal reflux disease)    History of colonic polyps    History of hiatal hernia    many years ago   History of rheumatic fever    History of stroke 07/18/2017   Hypertriglyceridemia 07/18/2017   S/P TAVR (transcatheter aortic valve replacement) 01/26/2022   Edwards S3UR 29mm via TF approach with Dr. Lorie Rook and Dr. Sherene Dilling   Severe aortic stenosis    Type 2 diabetes mellitus with diabetic neuropathy, unspecified (HCC) 07/18/2017    Subjective:   This NP Lizbeth Right reviewed medical records, received report from team, assessed the patient and then meet at the patient's bedside to discuss diagnosis, prognosis, GOC, EOL wishes disposition and options.  I met with the patient at the bedside.  His wife Tommy Green was also present.  I spent a few minutes as to the patient how he feels, he is in the bedside chair.  Denies pain, nausea, vomiting at this time.  He indicates he has to use the restroom and staff came in to assist him so  his wife and I went to the family room for further discussion.   We meet to discuss diagnosis prognosis, GOC, EOL wishes, disposition and options. Concept of Palliative Care was introduced as specialized medical care for people and their families living with serious illness.  If focuses on providing relief from the symptoms and stress of a serious illness.  The goal is to improve quality of life for both the patient and the family. Values and goals of care important to patient and family were attempted to be elicited.  Created space and opportunity for patient  and family to explore thoughts and feelings regarding current medical situation   Natural trajectory and current clinical status were discussed. Questions and concerns addressed. Patient  encouraged to call with questions or concerns.    Patient/Family Understanding of Illness: She understands that he has a lot of illness, his memory has gotten bad, and has had increasing falls.  She notes his kidneys have taken a hit.  She notes that he does ambulate with a walker but seems to get confused over whether to move forward or backward.  In 1 instance he was trying to get to the toilet and was butted up against the tub and could not seem to figure out how to back up.  When he grabbed the over the toilet chair instead of sitting backward he pulled  the chair forward with caused him to fall.  Overall she is seen in ongoing decline especially in the past year.  When speaking with her son, her son is worried that he may not live 12 months.  She is quite tearful at this.  Life Review: The patient is married and has 2 sons.  He lives at home with his wife.  Goals: The patient's goal is to go home.  His wife's goal is for him to go where he can be safe, whether that is home or SNF/rehab.  Possibly need long-term care at some point.  Today's Discussion: In addition to discussion described above we had extensive discussion of various topics.  We spent some  time detailing a progressive decline and "little things" that have been noticed in the past year or so.  She is worried about his safety.  She also notes that she has a compression fracture and is getting older and unable to take care of him in this state.  She notes that he could walk on his own with a rolling walker and get to the bathroom himself that she could take him home.  She also notes that he really wants to go home.  However, again, if he requires even a moderate amount of care it would be quite difficult and frankly dangerous for her.  She feels that he should probably go to SNF/rehab.  She previously worked for several years at [facility] and indicates that Tommy Green at the facility told her that they could hold a spot for rehab for him.  She notes that if he does not improve well enough at rehab then they may have to look into long-term care.  She is not sure if she would be able to get a slot at [facility] as a currently have a waiting list.  She is not convinced that home health rehab would be effective his last time he did rehab with the therapist on his off days refused to do his exercises and subsequently did not improve.  We spent some time talk about CODE STATUS.  She is clear that he would want to be a DNR/DNI.  I informed her that he is currently listed as such.  We talked about how he is improving from his acute illness, but we are worried about the ongoing decline.  There may have to be tougher decisions and discussions in the future.  I offered that outpatient palliative care after discharge could likely be very helpful in watching his overall health and if it continues to decline they could have these discussions.  I gave examples of hemodialysis, feeding tubes, other life-prolonging measures.  Tommy Fontana was clear that he would never want hemodialysis or artificial nutrition.  I shared that it is important to think about these things as they may be very real discussions in the somewhat near  future.  She verbalized understanding.  38 of her discussion she aspect explained to him recommendation for rehab.  She has tried to tell him but he gets a bit frustrated with her.  Went back to the room and we talked about how the patient is doing.  We talked about getting therapy in the hospital.  We also shared that the medical team is considering that he likely will need to go to a rehab center to try to get stronger.  He seemed a little skeptical but then I shared that the concern is that he would not be safe to go home, would be at  increased risk for further falls and further illness, and could possibly hurt his wife she tried to help him as he is falling or after he is falling.  He shares that while he does not want to go home he knows that he needs to get "straightened out."  He also shares that he would not want to hurt his wife if he fell again.  He seems agreeable to rehab at this point.  I also shared that depending on how he does at rehab they could make further plans on care after that.  I shared the palliative medicine would follow-up in a couple days.  I encouraged them to continue discussions along the lines that we have had today. I provided emotional and general support through therapeutic listening, empathy, sharing of stories, therapeutic touch, and other techniques. I answered all questions and addressed all concerns to the best of my ability.  Afterward I sent a summary of the visit to the medical team and nursing team.  Review of Systems  Constitutional:        Denies pain in general  Respiratory:  Negative for shortness of breath.   Cardiovascular:  Negative for chest pain.  Gastrointestinal:  Negative for abdominal pain, nausea and vomiting.    Objective:   Primary Diagnoses: Present on Admission:  GI bleed  Essential hypertension  Hyperlipidemia associated with type 2 diabetes mellitus (HCC)  Type 2 diabetes mellitus with diabetic neuropathy, unspecified (HCC)  BPH  associated with nocturia  Acute renal failure superimposed on stage 3b chronic kidney disease (HCC)   Physical Exam Vitals and nursing note reviewed.  Constitutional:      General: He is not in acute distress.    Appearance: He is ill-appearing.  HENT:     Head: Normocephalic and atraumatic.  Cardiovascular:     Rate and Rhythm: Normal rate.  Pulmonary:     Effort: Pulmonary effort is normal. No respiratory distress.  Abdominal:     General: Abdomen is flat. There is no distension.  Skin:    General: Skin is warm and dry.  Neurological:     General: No focal deficit present.     Mental Status: He is alert.  Psychiatric:        Mood and Affect: Mood normal.        Behavior: Behavior normal.     Vital Signs:  BP (!) 162/66 (BP Location: Right Arm) Comment: notify to the nurse.  Pulse 71   Temp 98.4 F (36.9 C) (Oral)   Resp 16   Ht 5\' 11"  (1.803 m)   Wt 97 kg   SpO2 96%   BMI 29.82 kg/m   Palliative Assessment/Data: 50-60%    Advanced Care Planning:   Existing Vynca/ACP Documentation: Advance directive signed 08/11/2023  Primary Decision Maker: PATIENT with assistance of family  Code Status/Advance Care Planning: DNR-limited  A discussion was had today regarding advanced directives. Concepts specific to code status, artifical feeding and hydration, continued IV antibiotics and rehospitalization was had.  The difference between a aggressive medical intervention path and a palliative comfort care path for this patient at this time was had.   Decisions/Changes to ACP: None today  Assessment & Plan:   Impression: 78 year old male with acute presentation chronic comorbidities as described above.  The patient is not critically ill at this time, seems to be improving from his acute illness perspective.  However, there has been an overall decline in his after discharge.  Wife is clear about  he has very preset limits as far as no artificial nutrition, no  hemodialysis, etc.  She understands that these may be scary conversations in the future and is agreeable to outpatient palliative care.  She is hoping that he can get placed at [facility] for short-term rehab.  She understands that depending on how he does he may need long-term care and she is exploring options on how to do this.  Overall prognosis guarded to poor, especially in the long-term.  SUMMARY OF RECOMMENDATIONS   DNR-limited Continue full scope of care otherwise Per HCPOA and living well no artificial nutrition Per HCPOA patient would not want dialysis in the future Patient seems to be agreeable at this time to short-term rehab if recommended [facility] consult for referral to outpatient palliative care with hospice [facility] the [facility] Palliative medicine will follow-up in a couple days Please notify us  of any significant clinical change or new palliative needs in the interim  Symptom Management:  Per primary team PMT is available to assist as needed  Prognosis:  Unable to determine  Discharge Planning:  Skilled Nursing Facility for rehab with Palliative care service follow-up   Discussed with: Patient, family, medical team, nursing team, [facility] team    Thank you for allowing us  to participate in the care of Tommy Green PMT will continue to support holistically.  Time Total: 93 min  Detailed review of medical records (labs, imaging, vital signs), medically appropriate exam, discussed with treatment team, counseling and education to patient, family, & staff, documenting clinical information, medication management, coordination of care  Signed by: Lizbeth Right, NP Palliative Medicine Team  Team Phone # [phone] (Nights/Weekends)  01/23/2024, 3:45 PM

## 2024-01-23 NOTE — Progress Notes (Signed)
 PHARMACY - ANTICOAGULATION CONSULT NOTE  Pharmacy Consult for IV heparin  Indication: VTE  Allergies  Allergen Reactions   Canagliflozin Other (See Comments)    Pt reports that the use of Invokana caused acute KIDNEY FAILURE ([facility] records) and "allergic," per [facility]   Aspirin Other (See Comments)    Gastroesophageal reflux and "allergic," per [facility]    Atorvastatin Other (See Comments)    Myalgias, GI upset, and "allergic," per [facility]   Statins Other (See Comments)    Muscle pain- "allergic," per [facility]    Patient Measurements: Height: 5\' 11"  (180.3 cm) Weight: 97 kg (213 lb 12.8 oz) IBW/kg (Calculated) : 75.3 HEPARIN DW (KG): 95  Vital Signs: Temp: 97.6 F (36.4 C) (04/07 0522) BP: 174/80 (04/07 0545) Pulse Rate: 75 (04/07 0545)  Labs: Recent Labs    01/21/24 0716 01/21/24 1752 01/22/24 0244 01/23/24 0443  HGB 8.6*  --  9.2* 9.8*  HCT 26.3*  --  28.5* 30.0*  PLT 327  --  311 316  HEPARINUNFRC 0.19* 0.32 0.43 0.50  CREATININE 2.97*  --  2.97* 3.01*    Estimated Creatinine Clearance: 23.3 mL/min (A) (by C-G formula based on SCr of 3.01 mg/dL (H)).   Medical History: Past Medical History:  Diagnosis Date   Anemia    Essential hypertension 07/18/2017   Foot drop, left foot    due to back surgery in 01/2021   GERD (gastroesophageal reflux disease)    History of colonic polyps    History of hiatal hernia    many years ago   History of rheumatic fever    History of stroke 07/18/2017   Hypertriglyceridemia 07/18/2017   S/P TAVR (transcatheter aortic valve replacement) 01/26/2022   [name] 29mm via TF approach with Dr. Lynnette Caffey and Dr. Laneta Simmers   Severe aortic stenosis    Type 2 diabetes mellitus with diabetic neuropathy, unspecified (HCC) 07/18/2017    Medications:  Medications Prior to Admission  Medication Sig Dispense Refill Last Dose/Taking   acetaminophen (TYLENOL) 325 MG tablet Take 650 mg by mouth every 6 (six) hours as needed for mild pain or headache.    01/15/2024 Morning   aspirin EC 81 MG tablet Take 1 tablet (81 mg total) by mouth daily. Swallow whole. 90 tablet 3 01/14/2024 Morning   B Complex Vitamins (B COMPLEX PO) Take 1 tablet by mouth daily.   01/14/2024 Morning   Cholecalciferol (VITAMIN D3 SUPER STRENGTH) 50 MCG (2000 UT) CAPS Take 2,000 Units by mouth daily.   01/14/2024 Morning   escitalopram (LEXAPRO) 20 MG tablet Take 1 tablet (20 mg total) by mouth daily. 90 tablet 2 01/14/2024 Evening   etanercept (ENBREL MINI) 50 MG/ML injection Inject 1 mL (50 mg total) into the skin once a week. 12 mL 0 01/09/2024   ezetimibe (ZETIA) 10 MG tablet Take 1 tablet (10 mg total) by mouth daily. 90 tablet 3 01/14/2024 Evening   fenofibrate micronized (LOFIBRA) 200 MG capsule TAKE 1 CAPSULE DAILY BEFOREBREAKFAST 90 capsule 3 01/14/2024 Morning   finasteride (PROSCAR) 5 MG tablet Take 1 tablet (5 mg total) by mouth daily. 90 tablet 3 01/11/2024   HYDROcodone-acetaminophen (NORCO) 7.5-325 MG tablet Take 1 tablet by mouth every 6 (six) hours as needed for moderate pain (pain score 4-6). 30 tablet 0 01/14/2024 Bedtime   insulin aspart (NOVOLOG FLEXPEN) 100 UNIT/ML FlexPen Inject 0-20 Units into the skin See admin instructions. Inject 0-10 units into the skin before meals, PER SLIDING SCALE: BGL 0-200 = give nothing, 201-250 =  2 units, 251-300 = 4 units, 301-350 = 6 units, 351-400 = 8 units, >400 = 10 units, push fluids, and repeat BGL check in 2 hours. If BGL remains >400 after 2 hours, CALL MD.   01/14/2024 Bedtime   insulin degludec (TRESIBA FLEXTOUCH) 100 UNIT/ML FlexTouch Pen Inject 20 Units into the skin daily.   01/14/2024 Bedtime   leflunomide (ARAVA) 10 MG tablet TAKE 1 TABLET DAILY 90 tablet 0 01/14/2024 Morning   lisinopril (ZESTRIL) 40 MG tablet Take 1 tablet (40 mg total) by mouth in the morning. 90 tablet 3 01/14/2024 Morning   magnesium oxide (MAG-OX) 400 MG tablet Take 400 mg by mouth daily with lunch.   01/14/2024 Evening   melatonin 5 MG TABS Take 5 mg by  mouth at bedtime.   01/14/2024 Bedtime   Multiple Vitamins-Minerals (MULTIVITAMIN WITH MINERALS) tablet Take 1 tablet by mouth daily with breakfast.   01/14/2024 Morning   Omega 3 1000 MG CAPS Take 2,000 mg by mouth daily with lunch.   01/14/2024 Morning   omeprazole (PRILOSEC) 20 MG capsule Take 1 capsule (20 mg total) by mouth daily. 90 capsule 3 01/14/2024 Morning   Potassium 99 MG TABS Take 99 mg by mouth every evening.   01/14/2024 Noon   vitamin B-12 (CYANOCOBALAMIN) 1000 MCG tablet Take 1,000 mcg by mouth daily.   01/14/2024 Morning   vitamin E 180 MG (400 UNITS) capsule Take 400 Units by mouth daily.   01/14/2024 Morning   amoxicillin (AMOXIL) 500 MG tablet Take 4 tablets (2,000 mg total) by mouth as directed. 1 HOUR PRIOR TO DENTAL APPOINTMENTS (Patient not taking: Reported on 01/15/2024) 12 tablet 6 Not Taking   [EXPIRED] cefdinir (OMNICEF) 300 MG capsule Take 1 capsule (300 mg total) by mouth 2 (two) times daily for 7 days. 14 capsule 0    fluticasone (FLONASE) 50 MCG/ACT nasal spray Place 2 sprays into both nostrils daily. (Patient not taking: Reported on 01/15/2024) 16 g 6 Not Taking   HYDROcodone-acetaminophen (NORCO) 7.5-325 MG tablet Take 1 tablet by mouth every 6 (six) hours as needed for moderate pain (pain score 4-6). (Patient not taking: Reported on 01/15/2024) 30 tablet 0 Not Taking   HYDROcodone-acetaminophen (NORCO) 7.5-325 MG tablet Take 1 tablet by mouth every 6 (six) hours as needed for moderate pain (pain score 4-6). (Patient not taking: Reported on 01/15/2024) 30 tablet 0 Not Taking   ONETOUCH VERIO test strip 1 each by Other route as needed for other (Glucose).       Assessment: Pharmacy is consulted to start IV heparin in 78 yo male diagnosed with acute DVT involving the right radial veins and acute superficial vein thrombosis involving the right basilic vein and right cephalic vein. Pt not on anticoagulation PTA . There has also been concerns for GI bleed. Per consult, no bolus  due to anemia.   Today, 01/23/24 HL 0.5, therapeutic on heparin 2000 units/hr CBC:  Hgb 9.8, Plt WNL No complications of therapy noted, RN reports no bleeding.  SCr up to 3   Goal of Therapy:  Heparin level 0.3-0.7 units/ml Monitor platelets by anticoagulation protocol: Yes   Plan:  Continue heparin infusion at 2000 units/hr  Daily CBC, heparin level while on heparin drip Monitor for signs and symptoms of bleeding   Lynann Beaver PharmD, BCPS [facility] main pharmacy [phone] 01/23/2024 8:12 AM

## 2024-01-23 NOTE — Progress Notes (Addendum)
 PROGRESS NOTE    Tommy Green  WUJ:[id number] DOB: Mar 07, 1946 DOA: 01/15/2024 PCP: [name], DO  Chief Complaint  Patient presents with   Urinary Frequency   Fall    Brief Narrative:    78 yo male with PMH malignant melanoma, DMII, RA, GERD, hx CVA, HLD who presented with weakness, recurrent falls, and some persistent dark stools.  He had fallen about 1 week prior in the bathroom and struck his right face resulting in Berdella Bacot bruised eye as well.  On workup he was noted to have Hgb 6.5 g/dL and underwent 2 units PRBC on admission. Multiple imaging studies were obtained due to his falls and no traumatic injuries were noted. He was however noted to have Shadiyah Wernli right hepatic lobe lesion measuring 4.2 x 4.8 cm along with Antario Yasuda left lower lobe nodule measuring 6 x 7 mm. GI was also consulted due to concern for GI bleed.  He has Chloris Marcoux known history of esophageal varices,  Schatzki ring, and gastric polyps on last EGD in Nov 2021.    He was admitted for ongoing GI evaluation and further workup regarding masses noted on scans.    Assessment & Plan:   Principal Problem:   GI bleed Active Problems:   Acute renal failure superimposed on stage 3b chronic kidney disease (HCC)   Liver mass   Lung nodule   Essential hypertension   Hyperlipidemia associated with type 2 diabetes mellitus (HCC)   Type 2 diabetes mellitus with diabetic neuropathy, unspecified (HCC)   S/P TAVR (transcatheter aortic valve replacement)   BPH associated with nocturia   Rheumatoid arteritis (HCC)   Heme positive stool   Abnormal CT of liver   Acute on chronic anemia   Liver abscess  Goals of Care Wife trying to decide whether he should go to SNF vs home with [facility].  She notes he does have Cruise Baumgardner DNR.  Appreciate palliative care involvement.   Liver Lesion Liver Abscess MRI 3/31 with multilobulated, internally septated rim enhancing lesion within the inferior right lobe of the liver, hepatic segment V/VI measuring  4.5x3.7 cm.  Second small component which is inserpatble from this larger lesion and the liver capsule situated just inferiorly measuring 2/5x2.4 cm.  Abscess vs cystic metastasis.  Small enhancing lesion in the anterior right lobe of the liver. S/p US aspiration and core biopsy 4/2 - follow pathology (reactive fibrosis and inflammation c/w abscess) Rare gram negative rods on gram stain, concerning for liver abscess (though no pus able to be aspirated yesterday) - follow final culture results (e. Coli, resistant to amp/unasyn) Currently on abx Appreciate ID consult - transition to oral abx when culture results (appears fluoroquinolone may be best option for PO), recommending 4 weeks abx with 1 refill at discharge  GI Bleed  Anemia  Iron Def Presented with Hb 6.5 on admission (was ~8-9 previously) S/p 3 units pRBC S/p EGD 4/1 -> with posterior oropharynx abnormal with congested mucosa and hematin, grade 1 esophageal varices noted proximally and in the middle of the esophagus.  No gross lesions in the distal esophagus.  Low grade narrowing schatzki ring.  5 cm hiatal hernia.  Erythematous mucosa in the stomach (biopsied).  No evidence of portal gastropathy or varices of the stomach.  Biopsies suggestive of iron pill gastritis, chronic peptic duodenitis.  GI recommending ENT evaluation (planning for outpatient follow up - follow up placed in discharge instructions), IR evaluation of hepatic lesion (s/p biopsy).  (See report)  Nausea  Vomiting  Unclear cause, will monitor Antiemetics Plain films with nonobstructive bowel gas pattern  DVT  Will cautiously anticoagulate, trend H/H  AKI on CKD IIIb Metabolic Acidosis Baseline creatinine 1.8-2.2 Current creatinine 3, 1.4 L out in past 24 hrs Will trend Add bicarb.  IVF today. Follow CT renal stone study Ensure he's taking good PO  PVC's Run of PVC couplets yesterday, started on metoprolol  EF with preserved EF  Cognitive Deficit ? Memory  issues over the past few years, she notes he's sundowned at times in the hospital, doesn't do well on seroquel.  Appropriate this AM Outpatient neurology follow up  Mechanical Falls Therapy  Posterior oropharynx abnormal with congested mucosa and hematin  ENT planning outpatient follow up  Proximal Upstream Varicose Veins Unclear cause.  Warrants additional follow up - per GI can be seen with significant congestive venous issues, r/o SVC syndrome or obstructive venous outflow  Upper extremity US as above.  CT chest abdomen pelvis reviewed, CT head and C spine reviewed.   Lung Nodule 6x7 mm nodule in left lower lobe, warrants follow up  Rheumatoid Arthritis Enbrel chronically  E. Coli UTI On abx as above  BPH Finasteride  S/p TAVR  T2DM A1c 5.8 12/2023 SSI, basal insulin on hold  Hyperlipidemia Zetia  Hypertension Lisinopril on hold BP elevated, start amlodipine    DVT prophylaxis: SCD Code Status: full -> transitioned to DNR today after discussion with wife, he had Trenna Kiely DNR Family Communication: wife at bedside Disposition:   Status is: Inpatient Remains inpatient appropriate because: need for continued inpatient care   Consultants:  GI IR  Procedures:  Upper endoscopy US aspiration and core biopsy   Antimicrobials:  Anti-infectives (From admission, onward)    Start     Dose/Rate Route Frequency Ordered Stop   01/20/24 1030  metroNIDAZOLE (FLAGYL) tablet 500 mg  Status:  Discontinued        500 mg Oral Every 12 hours 01/20/24 0933 01/21/24 1404   01/20/24 1000  cefTRIAXone (ROCEPHIN) 2 g in sodium chloride 0.9 % 100 mL IVPB        2 g 200 mL/hr over 30 Minutes Intravenous Every 24 hours 01/19/24 0947     01/17/24 0845  metroNIDAZOLE (FLAGYL) IVPB 500 mg  Status:  Discontinued        500 mg 100 mL/hr over 60 Minutes Intravenous 2 times daily 01/17/24 0759 01/20/24 0933   01/16/24 1000  cefTRIAXone (ROCEPHIN) 1 g in sodium chloride 0.9 % 100 mL IVPB         1 g 200 mL/hr over 30 Minutes Intravenous Every 24 hours 01/15/24 1600 01/19/24 1009   01/15/24 1000  cefTRIAXone (ROCEPHIN) 1 g in sodium chloride 0.9 % 100 mL IVPB        1 g 200 mL/hr over 30 Minutes Intravenous  Once 01/15/24 0951 01/15/24 1037       Subjective: Sejal Cofield&O, no new complaints  Objective: Vitals:   01/22/24 2112 01/23/24 0522 01/23/24 0545 01/23/24 1406  BP: (!) 161/88 (!) 176/90 (!) 174/80 (!) 162/66  Pulse: 81 76 75 71  Resp:  15  16  Temp:  97.6 F (36.4 C)  98.4 F (36.9 C)  TempSrc:    Oral  SpO2:  99%  96%  Weight:      Height:        Intake/Output Summary (Last 24 hours) at 01/23/2024 1519 Last data filed at 01/23/2024 1400 Gross per 24 hour  Intake 4284.13 ml  Output  1250 ml  Net 3034.13 ml   Filed Weights   01/15/24 0924  Weight: 97 kg    Examination:  General: No acute distress. Cardiovascular: RRR Lungs: unlabored Abdomen: distended/protuberant, non tender Neurological: Alert and oriented 3. Moves all extremities 4 with equal strength. Cranial nerves II through XII grossly intact. Extremities: No clubbing or cyanosis. No edema.   Data Reviewed: I have personally reviewed following labs and imaging studies  CBC: Recent Labs  Lab 01/18/24 0458 01/19/24 0444 01/20/24 0443 01/21/24 0716 01/22/24 0244 01/23/24 0443  WBC 12.5* 16.2* 12.9* 12.1* 11.5* 11.5*  NEUTROABS 9.2* 13.5* 10.0* 8.6*  --  8.1*  HGB 7.7* 7.7* 7.6* 8.6* 9.2* 9.8*  HCT 24.4* 25.9* 23.7* 26.3* 28.5* 30.0*  MCV 84.4 90.2 84.3 83.5 84.1 83.6  PLT 343 391 329 327 311 316    Basic Metabolic Panel: Recent Labs  Lab 01/19/24 0444 01/20/24 0443 01/21/24 0716 01/22/24 0244 01/23/24 0443  NA 136 133* 133* 133* 136  K 3.4* 3.3* 3.4* 3.3* 3.4*  CL 110 107 106 108 108  CO2 13* 15* 14* 16* 17*  GLUCOSE 153* 157* 140* 166* 114*  BUN 43* 45* 47* 43* 41*  CREATININE 2.57* 2.74* 2.97* 2.97* 3.01*  CALCIUM 8.6* 8.3* 8.2* 8.2* 8.3*  MG 1.7 1.7 1.5* 1.6* 1.6*  PHOS   --   --   --  3.7 3.7    GFR: Estimated Creatinine Clearance: 23.3 mL/min (Elpidia Karn) (by C-G formula based on SCr of 3.01 mg/dL (H)).  Liver Function Tests: Recent Labs  Lab 01/22/24 0244 01/23/24 0443  AST 15 18  ALT 16 14  ALKPHOS 49 47  BILITOT 0.4 0.5  PROT 5.9* 6.1*  ALBUMIN 2.4* 2.5*    CBG: Recent Labs  Lab 01/22/24 1144 01/22/24 1617 01/22/24 2030 01/23/24 0753 01/23/24 1217  GLUCAP 205* 142* 105* 130* 151*     Recent Results (from the past 240 hours)  Urine Culture     Status: Abnormal   Collection Time: 01/15/24  9:31 AM   Specimen: Urine, Random  Result Value Ref Range Status   Specimen Description   Final    URINE, RANDOM Performed at [facility], [address] [place], [place] [address]    Special Requests   Final    NONE Reflexed from [id number] Performed at [facility], [address] [place], [place] [address]    Culture >=100,000 COLONIES/mL ESCHERICHIA COLI (Ramandeep Arington)  Final   Report Status 01/17/2024 FINAL  Final   Organism ID, Bacteria ESCHERICHIA COLI (Patriciaann Rabanal)  Final      Susceptibility   Escherichia coli - MIC*    AMPICILLIN >=32 RESISTANT Resistant     CEFAZOLIN >=64 RESISTANT Resistant     CEFEPIME <=0.12 SENSITIVE Sensitive     CEFTRIAXONE <=0.25 SENSITIVE Sensitive     CIPROFLOXACIN <=0.25 SENSITIVE Sensitive     GENTAMICIN <=1 SENSITIVE Sensitive     IMIPENEM <=0.25 SENSITIVE Sensitive     NITROFURANTOIN <=16 SENSITIVE Sensitive     TRIMETH/SULFA <=20 SENSITIVE Sensitive     AMPICILLIN/SULBACTAM >=32 RESISTANT Resistant     PIP/TAZO 16 SENSITIVE Sensitive ug/mL    * >=100,000 COLONIES/mL ESCHERICHIA COLI  Blood culture (routine x 2)     Status: None   Collection Time: 01/15/24 10:05 AM   Specimen: BLOOD LEFT ARM  Result Value Ref Range Status   Specimen Description   Final    BLOOD LEFT ARM Performed at [facility]  [address], [address] [employer] [place], [place] [address]    Special Requests   Final    BOTTLES DRAWN  AEROBIC AND ANAEROBIC Blood Culture adequate volume Performed at [facility], [address] [place], [place] [address]    Culture   Final    NO GROWTH 5 DAYS Performed at [facility], [address] [place], [place] [address]    Report Status 01/20/2024 FINAL  Final  Blood culture (routine x 2)     Status: None   Collection Time: 01/15/24 10:05 AM   Specimen: BLOOD RIGHT ARM  Result Value Ref Range Status   Specimen Description   Final    BLOOD RIGHT ARM Performed at [facility], [address] [place], [place] [address]    Special Requests   Final    BOTTLES DRAWN AEROBIC AND ANAEROBIC Blood Culture adequate volume Performed at [facility], [address], [place], [place] [address]    Culture   Final    NO GROWTH 5 DAYS Performed at [facility], [address], [place], [place] [address]    Report Status 01/20/2024 FINAL  Final  Aerobic/Anaerobic Culture w Gram Stain (surgical/deep wound)     Status: None   Collection Time: 01/18/24  3:07 PM   Specimen: Abscess  Result Value Ref Range Status   Specimen Description   Final    ABSCESS Performed at [facility], [address], [place], [place] [address]    Special Requests   Final    NONE Performed at [facility], [address], [place], [place] [address]    Gram Stain   Final    MODERATE WBC PRESENT, PREDOMINANTLY PMN RARE GRAM NEGATIVE RODS    Culture   Final    MODERATE ESCHERICHIA COLI NO ANAEROBES ISOLATED Performed at [facility], [address] [place], [place] [address]    Report Status 01/23/2024 FINAL  Final   Organism ID, Bacteria ESCHERICHIA COLI  Final   Organism ID, Bacteria ESCHERICHIA COLI  Final      Susceptibility   Escherichia coli - MIC*    AMPICILLIN >=32 RESISTANT Resistant     CEFEPIME <=0.12 SENSITIVE Sensitive     CEFTAZIDIME <=1 SENSITIVE Sensitive     CEFTRIAXONE <=0.25 SENSITIVE  Sensitive     CIPROFLOXACIN <=0.25 SENSITIVE Sensitive     GENTAMICIN <=1 SENSITIVE Sensitive     IMIPENEM <=0.25 SENSITIVE Sensitive     TRIMETH/SULFA <=20 SENSITIVE Sensitive     AMPICILLIN/SULBACTAM >=32 RESISTANT Resistant     PIP/TAZO 16 SENSITIVE Sensitive ug/mL   Escherichia coli - KIRBY BAUER*    CEFAZOLIN RESISTANT Resistant     * MODERATE ESCHERICHIA COLI    MODERATE ESCHERICHIA COLI         Radiology Studies: No results found.       Scheduled Meds:  amLODipine  10 mg Oral Daily   escitalopram  20 mg Oral Daily   ezetimibe  10 mg Oral QPM   fenofibrate  54 mg Oral Daily   finasteride  5 mg Oral Daily   insulin aspart  0-15 Units Subcutaneous TID WC   insulin aspart  0-5 Units Subcutaneous QHS   magnesium oxide  400 mg Oral Daily   melatonin  5 mg Oral QHS   metoprolol tartrate  12.5 mg Oral BID   pantoprazole  40 mg Oral Daily  sodium bicarbonate  650 mg Oral TID   traZODone  25 mg Oral QHS   Continuous Infusions:  cefTRIAXone (ROCEPHIN)  IV 2 g (01/23/24 0909)   heparin 2,000 Units/hr (01/23/24 0943)   lactated ringers 100 mL/hr at 01/23/24 0143   promethazine (PHENERGAN) injection (IM or IVPB) 6.25 mg (01/20/24 1157)     LOS: 8 days    Time spent: over 30 min    Lacretia Nicks, MD [facility]   To contact the attending provider between 7A-7P or the covering provider during after hours 7P-7A, please log into the web site [email or web address] and access using universal [place] password for that web site. If you do not have the password, please call the hospital operator.  01/23/2024, 3:19 PM

## 2024-01-23 NOTE — Progress Notes (Signed)
 NP Garner Nash was notified that patient's BP is 174/80.

## 2024-01-23 NOTE — Plan of Care (Signed)
  Problem: Health Behavior/Discharge Planning: Goal: Ability to manage health-related needs will improve 01/23/2024 1841 by Peggye Ley, RN Outcome: Progressing 01/23/2024 1841 by Peggye Ley, RN Outcome: Progressing   Problem: Activity: Goal: Risk for activity intolerance will decrease 01/23/2024 1841 by Peggye Ley, RN Outcome: Progressing   Problem: Nutrition: Goal: Adequate nutrition will be maintained 01/23/2024 1841 by Peggye Ley, RN Outcome: Progressing 01/23/2024 1841 by Peggye Ley, RN Outcome: Progressing   Problem: Coping: Goal: Level of anxiety will decrease 01/23/2024 1841 by Peggye Ley, RN Outcome: Progressing 01/23/2024 1841 by Peggye Ley, RN Outcome: Progressing

## 2024-01-24 ENCOUNTER — Inpatient Hospital Stay (HOSPITAL_COMMUNITY)

## 2024-01-24 DIAGNOSIS — N179 Acute kidney failure, unspecified: Secondary | ICD-10-CM | POA: Diagnosis not present

## 2024-01-24 DIAGNOSIS — K750 Abscess of liver: Secondary | ICD-10-CM | POA: Diagnosis not present

## 2024-01-24 DIAGNOSIS — D649 Anemia, unspecified: Secondary | ICD-10-CM | POA: Diagnosis not present

## 2024-01-24 LAB — PHOSPHORUS: Phosphorus: 3.2 mg/dL (ref 2.5–4.6)

## 2024-01-24 LAB — GLUCOSE, CAPILLARY
Glucose-Capillary: 155 mg/dL — ABNORMAL HIGH (ref 70–99)
Glucose-Capillary: 150 mg/dL — ABNORMAL HIGH (ref 70–99)
Glucose-Capillary: 147 mg/dL — ABNORMAL HIGH (ref 70–99)
Glucose-Capillary: 150 mg/dL — ABNORMAL HIGH (ref 70–99)

## 2024-01-24 LAB — COMPREHENSIVE METABOLIC PANEL WITH GFR
ALT: 14 U/L (ref 0–44)
AST: 18 U/L (ref 15–41)
Albumin: 2.5 g/dL — ABNORMAL LOW (ref 3.5–5.0)
Alkaline Phosphatase: 47 U/L (ref 38–126)
Anion gap: 9 (ref 5–15)
BUN: 39 mg/dL — ABNORMAL HIGH (ref 8–23)
CO2: 17 mmol/L — ABNORMAL LOW (ref 22–32)
Calcium: 8.1 mg/dL — ABNORMAL LOW (ref 8.9–10.3)
Chloride: 107 mmol/L (ref 98–111)
Creatinine, Ser: 2.85 mg/dL — ABNORMAL HIGH (ref 0.61–1.24)
GFR, Estimated: 22 mL/min — ABNORMAL LOW (ref >60)
Glucose, Bld: 144 mg/dL — ABNORMAL HIGH (ref 70–99)
Potassium: 3.2 mmol/L — ABNORMAL LOW (ref 3.5–5.1)
Sodium: 133 mmol/L — ABNORMAL LOW (ref 135–145)
Total Bilirubin: 0.5 mg/dL (ref 0.0–1.2)
Total Protein: 5.9 g/dL — ABNORMAL LOW (ref 6.5–8.1)

## 2024-01-24 LAB — CBC WITH DIFFERENTIAL/PLATELET
Abs Immature Granulocytes: 0.07 10*3/uL (ref 0.00–0.07)
Basophils Absolute: 0.1 10*3/uL (ref 0.0–0.1)
Basophils Relative: 1 %
Eosinophils Absolute: 0.3 10*3/uL (ref 0.0–0.5)
Eosinophils Relative: 2 %
HCT: 28.4 % — ABNORMAL LOW (ref 39.0–52.0)
Hemoglobin: 8.9 g/dL — ABNORMAL LOW (ref 13.0–17.0)
Immature Granulocytes: 1 %
Lymphocytes Relative: 19 %
Lymphs Abs: 2.4 10*3/uL (ref 0.7–4.0)
MCH: 26.7 pg (ref 26.0–34.0)
MCHC: 31.3 g/dL (ref 30.0–36.0)
MCV: 85.3 fL (ref 80.0–100.0)
Monocytes Absolute: 0.8 10*3/uL (ref 0.1–1.0)
Monocytes Relative: 6 %
Neutro Abs: 8.7 10*3/uL — ABNORMAL HIGH (ref 1.7–7.7)
Neutrophils Relative %: 71 %
Platelets: 316 10*3/uL (ref 150–400)
RBC: 3.33 MIL/uL — ABNORMAL LOW (ref 4.22–5.81)
RDW: 17.8 % — ABNORMAL HIGH (ref 11.5–15.5)
WBC: 12.2 10*3/uL — ABNORMAL HIGH (ref 4.0–10.5)
nRBC: 0 % (ref 0.0–0.2)

## 2024-01-24 LAB — MAGNESIUM: Magnesium: 1.5 mg/dL — ABNORMAL LOW (ref 1.7–2.4)

## 2024-01-24 LAB — HEPARIN LEVEL (UNFRACTIONATED): Heparin Unfractionated: 0.58 [IU]/mL (ref 0.30–0.70)

## 2024-01-24 MED ORDER — APIXABAN 5 MG PO TABS
10.0000 mg | ORAL_TABLET | Freq: Two times a day (BID) | ORAL | Status: DC
  Filled 2024-01-24: qty 2

## 2024-01-24 MED ORDER — LACTATED RINGERS IV SOLN: INTRAVENOUS | Status: AC

## 2024-01-24 MED ORDER — APIXABAN 5 MG PO TABS
5.0000 mg | ORAL_TABLET | Freq: Two times a day (BID) | ORAL | Status: DC
  Administered 2024-01-24 – 2024-01-26 (×4): 5 mg via ORAL
  Filled 2024-01-24 (×4): qty 1

## 2024-01-24 MED ORDER — POTASSIUM CHLORIDE CRYS ER 20 MEQ PO TBCR
20.0000 meq | EXTENDED_RELEASE_TABLET | Freq: Once | ORAL | Status: AC
  Administered 2024-01-24: 20 meq via ORAL
  Filled 2024-01-24: qty 1

## 2024-01-24 MED ORDER — APIXABAN 5 MG PO TABS: 5.0000 mg | ORAL_TABLET | Freq: Two times a day (BID) | ORAL | Status: DC

## 2024-01-24 NOTE — Progress Notes (Signed)
 Physical Therapy Treatment Patient Details Name: Tommy Green MRN: [id number] DOB: [birth year] Today's Date: 01/24/2024   History of Present Illness 78 yo with progressive decline in subjective health; GI bleed (Hgb 6.1 on admission) and recent fall. Imaging revealed liver with lesions concerning for liver mets - workup underway. RUE DVT 01/19/24.  PMH: AS s/p tavr 01/2022, RA, gerd, dm2, cva, malignant melanoma, iron deficiency anemia, esophageal varices/portal hypertensive gastropathy, recent possible uti.    PT Comments  Pt seen for PT tx with pt agreeable. Session focused on transfers & gait with pt performing blocked practice of sit<>stand transfers at end of session with pt requiring rest break between each. Pt requires mod assist increasing to max assist for STS. Pt ambulates into hallway with RW & min assist with impaired gait pattern as noted below. Pt engaged in reaching for objects low & in front of him with focus on progressing anterior weight shift to carry over to sit<>stand transfers; pt reports back pain limits him. Continue to recommend post acute rehab <3 hours therapy/day upon d/c from acute setting.    If plan is discharge home, recommend the following: A lot of help with walking and/or transfers;A lot of help with bathing/dressing/bathroom;Assistance with cooking/housework;Assist for transportation;Help with stairs or ramp for entrance   Can travel by private vehicle     No  Equipment Recommendations  None recommended by PT    Recommendations for Other Services       Precautions / Restrictions Precautions Precautions: Fall Restrictions Weight Bearing Restrictions Per Provider Order: No     Mobility  Bed Mobility               General bed mobility comments: not tested, pt received & left sitting in recliner    Transfers Overall transfer level: Needs assistance Equipment used: Rolling walker (2 wheels) Transfers: Sit to/from Stand Sit to Stand: Mod  assist, Max assist           General transfer comment: Cuing to scoot out to edge of seat, place BLE underneath BOS, shift trunk anteriorly & push up to standing. Pt with decreased ability to shift trunk anteriorly; reports it's 2/2 back pain but pt also limited by body habitus. Pt requires mod assist increasing to max assist for STS from recliner.    Ambulation/Gait Ambulation/Gait assistance: Min assist Gait Distance (Feet): 25 Feet Assistive device: Rolling walker (2 wheels) Gait Pattern/deviations: Decreased step length - left, Decreased stride length, Decreased dorsiflexion - left, Decreased step length - right, Narrow base of support Gait velocity: decreased     General Gait Details: decreased dorsiflexion & heel strike LLE, unsteadiness on feet.   Stairs             Wheelchair Mobility     Tilt Bed    Modified Rankin (Stroke Patients Only)       Balance Overall balance assessment: History of Falls, Needs assistance   Sitting balance-Leahy Scale: Fair     Standing balance support: During functional activity, Bilateral upper extremity supported, Reliant on assistive device for balance Standing balance-Leahy Scale: Poor                              Communication Communication Communication: No apparent difficulties  Cognition Arousal: Alert Behavior During Therapy: WFL for tasks assessed/performed   PT - Cognitive impairments: Memory, Safety/Judgement, Orientation  PT - Cognition Comments: Pt oriented to self, month, date, but requires extra time & assistance to deduce birthday was a year ago. Following commands: Intact      Cueing Cueing Techniques: Verbal cues, Tactile cues, Visual cues  Exercises      General Comments General comments (skin integrity, edema, etc.): HR 85 bpm during session      Pertinent Vitals/Pain Pain Assessment Pain Assessment: Faces Faces Pain Scale: Hurts even more Pain  Location: chronic back pain Pain Descriptors / Indicators: Discomfort, Grimacing, Guarding Pain Intervention(s): Monitored during session, Repositioned    Home Living                          Prior Function            PT Goals (current goals can now be found in the care plan section) Acute Rehab PT Goals Patient Stated Goal: to get strong enough to go home PT Goal Formulation: With patient/family Time For Goal Achievement: 02/04/24 Potential to Achieve Goals: Fair Progress towards PT goals: Progressing toward goals    Frequency    Min 3X/week      PT Plan      Co-evaluation              AM-PAC PT "6 Clicks" Mobility   Outcome Measure  Help needed turning from your back to your side while in a flat bed without using bedrails?: A Lot Help needed moving from lying on your back to sitting on the side of a flat bed without using bedrails?: A Lot Help needed moving to and from a bed to a chair (including a wheelchair)?: A Lot Help needed standing up from a chair using your arms (e.g., wheelchair or bedside chair)?: A Lot Help needed to walk in hospital room?: A Little Help needed climbing 3-5 steps with a railing? : Total 6 Click Score: 12    End of Session Equipment Utilized During Treatment: Gait belt Activity Tolerance: Patient tolerated treatment well;Patient limited by fatigue Patient left: in chair;with chair alarm set;with call bell/phone within reach;with family/visitor present Nurse Communication: Mobility status PT Visit Diagnosis: Unsteadiness on feet (R26.81);Difficulty in walking, not elsewhere classified (R26.2);Muscle weakness (generalized) (M62.81);Other abnormalities of gait and mobility (R26.89);Pain Pain - part of body:  (back)     Time: 1610-9604 PT Time Calculation (min) (ACUTE ONLY): 24 min  Charges:    $Therapeutic Activity: 23-37 mins PT General Charges $$ ACUTE PT VISIT: 1 Visit                     Aleda Grana, PT,  DPT 01/24/24, 3:47 PM   Sandi Mariscal 01/24/2024, 3:45 PM

## 2024-01-24 NOTE — Progress Notes (Signed)
 Occupational Therapy Treatment Patient Details Name: Tommy Green MRN: [id number] DOB: June 06, 1946 Today's Date: 01/24/2024   History of present illness 78 yo with progressive decline in subjective health; GI bleed (Hgb 6.1 on admission) and recent fall. Imaging revealed liver with lesions concerning for liver mets - workup underway. RUE DVT 01/19/24.  PMH: AS s/p tavr 01/2022, RA, gerd, dm2, cva, malignant melanoma, iron deficiency anemia, esophageal varices/portal hypertensive gastropathy, recent possible uti.   OT comments  Patient seen for skilled OT session this afternoon. Patient's wife present and active participant in session for education and training. Patient open to all therapy presented including RW access from recliner to commode via short ambulation 5 ft x 2 intervals, BADL's seated level and light UE theract as outlined below. Patient continues to require Acute level skilled OT services to progress functional status. Patient will benefit from continued inpatient follow up therapy, <3 hours/day.        If plan is discharge home, recommend the following:  A lot of help with walking and/or transfers;A lot of help with bathing/dressing/bathroom;Assistance with cooking/housework;Direct supervision/assist for medications management;Direct supervision/assist for financial management;Assist for transportation;Help with stairs or ramp for entrance   Equipment Recommendations  Wheelchair (measurements OT);Wheelchair cushion (measurements OT);BSC/3in1       Precautions / Restrictions Precautions Precautions: Fall Restrictions Weight Bearing Restrictions Per Provider Order: No       Mobility Bed Mobility               General bed mobility comments: pt up in recliner upon OT arrival    Transfers Overall transfer level: Needs assistance Equipment used: Rolling walker (2 wheels) Transfers: Sit to/from Stand, Bed to chair/wheelchair/BSC Sit to Stand: +2 safety/equipment, Mod  assist     Step pivot transfers: Min assist     General transfer comment: mod cues and facilitation for forward weight shifts     Balance           Standing balance support: Bilateral upper extremity supported, During functional activity, Reliant on assistive device for balance Standing balance-Leahy Scale: Poor                             ADL either performed or assessed with clinical judgement   ADL Overall ADL's : Needs assistance/impaired     Grooming: Set up;Sitting                   Toilet Transfer: Moderate assistance;Stand-pivot;Rolling walker (2 wheels);Requires wide/bariatric   Toileting- Clothing Manipulation and Hygiene: Maximal assistance;Sitting/lateral lean;Sit to/from stand       Functional mobility during ADLs: Moderate assistance;Rolling walker (2 wheels);Cueing for safety (facilitation for forward weight shift with sit to stands and cues to power up) General ADL Comments: fatigue with need for rests    Extremity/Trunk Assessment Upper Extremity Assessment Upper Extremity Assessment: Generalized weakness   Lower Extremity Assessment Lower Extremity Assessment: Generalized weakness           Perception Perception Perception: Within Functional Limits   Praxis Praxis Praxis: Impaired Praxis Impairment Details: Motor planning   Communication Communication Communication: No apparent difficulties   Cognition Arousal: Alert Behavior During Therapy: WFL for tasks assessed/performed Cognition: Cognition impaired   Orientation impairments: Time, Situation Awareness: Intellectual awareness impaired Memory impairment (select all impairments): Short-term memory Attention impairment (select first level of impairment): Sustained attention Executive functioning impairment (select all impairments): Initiation, Organization, Sequencing, Reasoning, Problem solving  Following commands: Intact        Cueing    Cueing Techniques: Verbal cues  Exercises Exercises: General Upper Extremity, Other exercises (B UE target toss activity in sitting with forward weight shifts and reaches Bly 1 min x 5 intervals with brief rests)    Shoulder Instructions       General Comments applied barrier cream to bottom post BM and hygiene    Pertinent Vitals/ Pain       Pain Assessment Pain Assessment: Faces Faces Pain Scale: No hurt   Frequency  Min 1X/week        Progress Toward Goals  OT Goals(current goals can now be found in the care plan section)  Progress towards OT goals: Progressing toward goals  Acute Rehab OT Goals Patient Stated Goal: to keep progressing OT Goal Formulation: With patient/family Time For Goal Achievement: 02/02/24 Potential to Achieve Goals: Good ADL Goals Pt Will Perform Lower Body Bathing: with min assist;sit to/from stand Pt Will Transfer to Toilet: with min assist;ambulating Pt Will Perform Toileting - Clothing Manipulation and hygiene: with min assist;sitting/lateral leans;sit to/from stand Additional ADL Goal #1: Bed mobility with CGA in preparation for ADL tasks  Plan      AM-PAC OT "6 Clicks" Daily Activity     Outcome Measure   Help from another person eating meals?: None Help from another person taking care of personal grooming?: A Little Help from another person toileting, which includes using toliet, bedpan, or urinal?: A Lot Help from another person bathing (including washing, rinsing, drying)?: A Lot Help from another person to put on and taking off regular upper body clothing?: A Little Help from another person to put on and taking off regular lower body clothing?: A Lot 6 Click Score: 16    End of Session Equipment Utilized During Treatment: Gait belt;Rolling walker (2 wheels)  OT Visit Diagnosis: Unsteadiness on feet (R26.81);Repeated falls (R29.6);Muscle weakness (generalized) (M62.81);Other symptoms and signs involving cognitive  function;Pain   Activity Tolerance Patient tolerated treatment well   Patient Left in chair;with call bell/phone within reach;with family/visitor present   Nurse Communication Mobility status;Other (comment) (BM entered into Flowsheets)        Time: 9629-5284 OT Time Calculation (min): 40 min  Charges: OT General Charges $OT Visit: 1 Visit OT Treatments $Self Care/Home Management : 8-22 mins $Therapeutic Activity: 23-37 mins  Elynore Dolinski OT/L Acute Rehabilitation Department  [phone]  01/24/2024, 2:59 PM

## 2024-01-24 NOTE — NC FL2 (Signed)
 [place] MEDICAID FL2 LEVEL OF CARE FORM     IDENTIFICATION  Patient Name: Tommy Green Birthdate: 05-17-1946 Sex: male Admission Date (Current Location): 01/15/2024  [facility] and [place] Number:  Producer, television/film/video and Address:  [facility],  [address], [place] [address]      Provider Number: [id number]  Attending Physician Name and Address:  Zigmund Daniel., *  Relative Name and Phone Number:  wife, Elease Hashimoto Shakir @ [phone]    Current Level of Care: Hospital Recommended Level of Care: Skilled Nursing Facility Prior Approval Number:    Date Approved/Denied:   PASRR Number: [id number]  Discharge Plan: SNF    Current Diagnoses: Patient Active Problem List   Diagnosis Date Noted   Urinary tract infection with hematuria 01/23/2024   Liver abscess 01/19/2024   Lung nodule 01/17/2024   Liver mass 01/16/2024   Rheumatoid arteritis (HCC) 01/16/2024   Heme positive stool 01/16/2024   Abnormal CT of liver 01/16/2024   Acute on chronic anemia 01/16/2024   GI bleed 01/15/2024   Depression, major, single episode, in partial remission (HCC) 10/05/2023   BPH associated with nocturia 10/05/2023   Statin myopathy 06/15/2023   IDA (iron deficiency anemia) 07/06/2022   Current mild episode of major depressive disorder without prior episode (HCC) 05/17/2022   Other chronic pain 03/05/2022   Bilateral lower extremity edema 03/05/2022   Severe aortic stenosis 01/26/2022   S/P TAVR (transcatheter aortic valve replacement) 01/26/2022   Microcytic anemia 12/10/2021   Obesity (BMI 30-39.9) 12/10/2021   Near syncope 12/09/2021   Aortic stenosis 12/09/2021   Rheumatoid arthritis (HCC) 12/09/2021   Foot drop, left foot 10/02/2021   History of colonic polyps 10/02/2021   History of rheumatic fever 10/02/2021   Stress fracture of left foot 09/03/2021   Gabapentin overdose, accidental or unintentional, initial encounter 03/08/2021   TIA (transient  ischemic attack) 03/05/2021   Body mass index (BMI) 36.0-36.9, adult 10/03/2020   Subdural hemorrhage following injury without open intracranial wound and with prolonged loss of consciousness (more than 24 hours) without return to pre-existing conscious level (HCC) 10/03/2020   Acute renal failure superimposed on stage 3b chronic kidney disease (HCC)    Acute encephalopathy    SDH (subdural hematoma) (HCC) 10/24/2019   Cyclic vomiting syndrome 08/06/2019   Neuropathy 12/25/2018   AK (actinic keratosis) 08/24/2018   Neoplasm of uncertain behavior 07/27/2018   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   Tendinopathy of right gluteus medius 07/13/2018   Tendinopathy of left gluteus medius 07/13/2018   Squamous cell carcinoma in situ (SCCIS) of skin 03/24/2018   Vitamin D deficiency 11/29/2017   Stage 3 chronic kidney disease (HCC) 10/28/2017   Primary osteoarthritis of right hip 09/11/2017   Essential hypertension 07/18/2017   Hyperlipidemia associated with type 2 diabetes mellitus (HCC) 07/18/2017   Type 2 diabetes mellitus with diabetic neuropathy, unspecified (HCC) 07/18/2017   Heart murmur 07/18/2017   Hypertriglyceridemia 07/18/2017   History of stroke 07/18/2017   DDD (degenerative disc disease), lumbar 06/15/2017   Iron deficiency anemia 06/15/2017   Stroke (HCC) 06/15/2017   Encounter for hepatitis C screening test for low risk patient 06/30/2016   Anemia 06/19/2016   Microalbuminuria due to type 2 diabetes mellitus (HCC) 03/19/2016   Hypertrophy of inferior nasal turbinate 12/20/2015   Disease of nasal cavity and sinuses 12/10/2015   Degenerative tear of acetabular labrum of left hip 11/04/2015   History of tobacco abuse 05/15/2015   Morbid (severe) obesity  due to excess calories (HCC) 04/11/2015   Disorder of both eustachian tubes 01/27/2015   Maxillary sinusitis 01/13/2015   Lipoprotein deficiency 01/31/2014   Gastro-esophageal reflux disease without esophagitis 11/02/2013    Rheumatic fever 11/02/2013   Erectile dysfunction associated with type 2 diabetes mellitus (HCC) 07/31/2013   Type 2 diabetes mellitus with hyperglycemia, without long-term current use of insulin (HCC) 11/16/2012    Orientation RESPIRATION BLADDER Height & Weight     Self, Place, Situation  Normal Continent, External catheter (currently with purewick) Weight: 213 lb 12.8 oz (97 kg) Height:  5\' 11"  (180.3 cm)  BEHAVIORAL SYMPTOMS/MOOD NEUROLOGICAL BOWEL NUTRITION STATUS      Continent Diet (dys 3, thin liquids)  AMBULATORY STATUS COMMUNICATION OF NEEDS Skin   Limited Assist Verbally Normal                       Personal Care Assistance Level of Assistance  Bathing, Dressing Bathing Assistance: Limited assistance   Dressing Assistance: Limited assistance     Functional Limitations Info  Sight, Hearing, Speech Sight Info: Adequate Hearing Info: Adequate Speech Info: Adequate    SPECIAL CARE FACTORS FREQUENCY  PT (By licensed PT), OT (By licensed OT)     PT Frequency: 5x/wk OT Frequency: 5x/wk            Contractures Contractures Info: Not present    Additional Factors Info  Code Status, Allergies, Psychotropic, Insulin Sliding Scale Code Status Info: DNR Allergies Info: Canagliflozin, Aspirin, Atorvastatin, Statins Psychotropic Info: see MAR Insulin Sliding Scale Info: see MAR       Current Medications (01/24/2024):  This is the current hospital active medication list Current Facility-Administered Medications  Medication Dose Route Frequency Provider Last Rate Last Admin   acetaminophen (TYLENOL) tablet 650 mg  650 mg Oral Q6H PRN Kirby Crigler, Mir M, MD   650 mg at 01/24/24 0920   Or   acetaminophen (TYLENOL) suppository 650 mg  650 mg Rectal Q6H PRN Kirby Crigler, Mir M, MD       albuterol (PROVENTIL) (2.5 MG/3ML) 0.083% nebulizer solution 2.5 mg  2.5 mg Nebulization Q2H PRN Kirby Crigler, Mir M, MD       amLODipine (NORVASC) tablet 10 mg  10 mg Oral Daily Zigmund Daniel., MD   10 mg at 01/24/24 0920   cefTRIAXone (ROCEPHIN) 2 g in sodium chloride 0.9 % 100 mL IVPB  2 g Intravenous Q24H Zigmund Daniel., MD 200 mL/hr at 01/24/24 0925 2 g at 01/24/24 0925   escitalopram (LEXAPRO) tablet 20 mg  20 mg Oral Daily Kirby Crigler, Mir M, MD   20 mg at 01/23/24 1341   ezetimibe (ZETIA) tablet 10 mg  10 mg Oral QPM Kirby Crigler, Mir M, MD   10 mg at 01/23/24 1737   fenofibrate tablet 54 mg  54 mg Oral Daily Kirby Crigler, Mir M, MD   54 mg at 01/24/24 0936   finasteride (PROSCAR) tablet 5 mg  5 mg Oral Daily Kirby Crigler, Mir M, MD   5 mg at 01/24/24 0921   fluticasone (FLONASE) 50 MCG/ACT nasal spray 2 spray  2 spray Each Nare Daily PRN Kirby Crigler, Mir M, MD       heparin ADULT infusion 100 units/mL (25000 units/23mL)  2,000 Units/hr Intravenous Continuous Zigmund Daniel., MD 20 mL/hr at 01/23/24 2151 2,000 Units/hr at 01/23/24 2151   hydrocerin (EUCERIN) cream   Topical BID PRN Pricilla Riffle, RPH       insulin  aspart (novoLOG) injection 0-15 Units  0-15 Units Subcutaneous TID WC Kirby Crigler, Mir M, MD   3 Units at 01/24/24 0900   insulin aspart (novoLOG) injection 0-5 Units  0-5 Units Subcutaneous QHS Kirby Crigler, Mir M, MD       lactated ringers infusion   Intravenous Continuous Zigmund Daniel., MD 100 mL/hr at 01/24/24 0659 New Bag at 01/24/24 0659   magnesium oxide (MAG-OX) tablet 400 mg  400 mg Oral Daily Zigmund Daniel., MD   400 mg at 01/24/24 0920   melatonin tablet 5 mg  5 mg Oral QHS Kirby Crigler, Mir M, MD   5 mg at 01/23/24 2102   metoprolol tartrate (LOPRESSOR) tablet 12.5 mg  12.5 mg Oral BID Zigmund Daniel., MD   12.5 mg at 01/24/24 0920   ondansetron (ZOFRAN) tablet 4 mg  4 mg Oral Q6H PRN Maryln Gottron, MD   4 mg at 01/20/24 1610   Or   ondansetron (ZOFRAN) injection 4 mg  4 mg Intravenous Q6H PRN Kirby Crigler, Mir M, MD       pantoprazole (PROTONIX) EC tablet 40 mg  40 mg Oral Daily Kirby Crigler, Mir M, MD   40 mg at  01/24/24 0920   polyethylene glycol (MIRALAX / GLYCOLAX) packet 17 g  17 g Oral Daily PRN Zigmund Daniel., MD       promethazine (PHENERGAN) tablet 25 mg  25 mg Oral Q6H PRN Zigmund Daniel., MD       Or   promethazine (PHENERGAN) 6.25 mg/NS 50 mL IVPB  6.25 mg Intravenous Q6H PRN Zigmund Daniel., MD 150 mL/hr at 01/20/24 1157 6.25 mg at 01/20/24 1157   Or   promethazine (PHENERGAN) suppository 25 mg  25 mg Rectal Q6H PRN Zigmund Daniel., MD       sodium bicarbonate tablet 650 mg  650 mg Oral TID Zigmund Daniel., MD   650 mg at 01/24/24 0920   traZODone (DESYREL) tablet 25 mg  25 mg Oral QHS Zigmund Daniel., MD   25 mg at 01/23/24 2102     Discharge Medications: Please see discharge summary for a list of discharge medications.  Relevant Imaging Results:  Relevant Lab Results:   Additional Information SS# [id number]  Amada Jupiter, LCSW

## 2024-01-24 NOTE — Progress Notes (Signed)
 PHARMACY - ANTICOAGULATION CONSULT NOTE  Pharmacy Consult for IV heparin  Indication: VTE  Allergies  Allergen Reactions   Canagliflozin Other (See Comments)    Pt reports that the use of Invokana caused acute KIDNEY FAILURE ([facility] records) and "allergic," per [facility]   Aspirin Other (See Comments)    Gastroesophageal reflux and "allergic," per MAR    Atorvastatin Other (See Comments)    Myalgias, GI upset, and "allergic," per [facility]   Statins Other (See Comments)    Muscle pain- "allergic," per [facility]    Patient Measurements: Height: 5\' 11"  (180.3 cm) Weight: 97 kg (213 lb 12.8 oz) IBW/kg (Calculated) : 75.3 HEPARIN DW (KG): 95  Vital Signs: Temp: 97.6 F (36.4 C) (04/08 0458) Temp Source: Oral (04/08 0458) BP: 172/59 (04/08 0458) Pulse Rate: 68 (04/08 0458)  Labs: Recent Labs    01/22/24 0244 01/23/24 0443 01/24/24 0453  HGB 9.2* 9.8* 8.9*  HCT 28.5* 30.0* 28.4*  PLT 311 316 316  HEPARINUNFRC 0.43 0.50 0.58  CREATININE 2.97* 3.01* 2.85*    Estimated Creatinine Clearance: 24.6 mL/min (A) (by C-G formula based on SCr of 2.85 mg/dL (H)).   Medical History: Past Medical History:  Diagnosis Date   Anemia    Essential hypertension 07/18/2017   Foot drop, left foot    due to back surgery in 01/2021   GERD (gastroesophageal reflux disease)    History of colonic polyps    History of hiatal hernia    many years ago   History of rheumatic fever    History of stroke 07/18/2017   Hypertriglyceridemia 07/18/2017   S/P TAVR (transcatheter aortic valve replacement) 01/26/2022   [name] 29mm via TF approach with Dr. Lynnette Caffey and Dr. Laneta Simmers   Severe aortic stenosis    Type 2 diabetes mellitus with diabetic neuropathy, unspecified (HCC) 07/18/2017    Medications:  Medications Prior to Admission  Medication Sig Dispense Refill Last Dose/Taking   acetaminophen (TYLENOL) 325 MG tablet Take 650 mg by mouth every 6 (six) hours as needed for mild pain or headache.   01/15/2024  Morning   aspirin EC 81 MG tablet Take 1 tablet (81 mg total) by mouth daily. Swallow whole. 90 tablet 3 01/14/2024 Morning   B Complex Vitamins (B COMPLEX PO) Take 1 tablet by mouth daily.   01/14/2024 Morning   Cholecalciferol (VITAMIN D3 SUPER STRENGTH) 50 MCG (2000 UT) CAPS Take 2,000 Units by mouth daily.   01/14/2024 Morning   escitalopram (LEXAPRO) 20 MG tablet Take 1 tablet (20 mg total) by mouth daily. 90 tablet 2 01/14/2024 Evening   etanercept (ENBREL MINI) 50 MG/ML injection Inject 1 mL (50 mg total) into the skin once a week. 12 mL 0 01/09/2024   ezetimibe (ZETIA) 10 MG tablet Take 1 tablet (10 mg total) by mouth daily. 90 tablet 3 01/14/2024 Evening   fenofibrate micronized (LOFIBRA) 200 MG capsule TAKE 1 CAPSULE DAILY BEFOREBREAKFAST 90 capsule 3 01/14/2024 Morning   finasteride (PROSCAR) 5 MG tablet Take 1 tablet (5 mg total) by mouth daily. 90 tablet 3 01/11/2024   HYDROcodone-acetaminophen (NORCO) 7.5-325 MG tablet Take 1 tablet by mouth every 6 (six) hours as needed for moderate pain (pain score 4-6). 30 tablet 0 01/14/2024 Bedtime   insulin aspart (NOVOLOG FLEXPEN) 100 UNIT/ML FlexPen Inject 0-20 Units into the skin See admin instructions. Inject 0-10 units into the skin before meals, PER SLIDING SCALE: BGL 0-200 = give nothing, 201-250 = 2 units, 251-300 = 4 units, 301-350 = 6  units, 351-400 = 8 units, >400 = 10 units, push fluids, and repeat BGL check in 2 hours. If BGL remains >400 after 2 hours, CALL MD.   01/14/2024 Bedtime   insulin degludec (TRESIBA FLEXTOUCH) 100 UNIT/ML FlexTouch Pen Inject 20 Units into the skin daily.   01/14/2024 Bedtime   leflunomide (ARAVA) 10 MG tablet TAKE 1 TABLET DAILY 90 tablet 0 01/14/2024 Morning   lisinopril (ZESTRIL) 40 MG tablet Take 1 tablet (40 mg total) by mouth in the morning. 90 tablet 3 01/14/2024 Morning   magnesium oxide (MAG-OX) 400 MG tablet Take 400 mg by mouth daily with lunch.   01/14/2024 Evening   melatonin 5 MG TABS Take 5 mg by mouth at  bedtime.   01/14/2024 Bedtime   Multiple Vitamins-Minerals (MULTIVITAMIN WITH MINERALS) tablet Take 1 tablet by mouth daily with breakfast.   01/14/2024 Morning   Omega 3 1000 MG CAPS Take 2,000 mg by mouth daily with lunch.   01/14/2024 Morning   omeprazole (PRILOSEC) 20 MG capsule Take 1 capsule (20 mg total) by mouth daily. 90 capsule 3 01/14/2024 Morning   Potassium 99 MG TABS Take 99 mg by mouth every evening.   01/14/2024 Noon   vitamin B-12 (CYANOCOBALAMIN) 1000 MCG tablet Take 1,000 mcg by mouth daily.   01/14/2024 Morning   vitamin E 180 MG (400 UNITS) capsule Take 400 Units by mouth daily.   01/14/2024 Morning   amoxicillin (AMOXIL) 500 MG tablet Take 4 tablets (2,000 mg total) by mouth as directed. 1 HOUR PRIOR TO DENTAL APPOINTMENTS (Patient not taking: Reported on 01/15/2024) 12 tablet 6 Not Taking   [EXPIRED] cefdinir (OMNICEF) 300 MG capsule Take 1 capsule (300 mg total) by mouth 2 (two) times daily for 7 days. 14 capsule 0    fluticasone (FLONASE) 50 MCG/ACT nasal spray Place 2 sprays into both nostrils daily. (Patient not taking: Reported on 01/15/2024) 16 g 6 Not Taking   HYDROcodone-acetaminophen (NORCO) 7.5-325 MG tablet Take 1 tablet by mouth every 6 (six) hours as needed for moderate pain (pain score 4-6). (Patient not taking: Reported on 01/15/2024) 30 tablet 0 Not Taking   HYDROcodone-acetaminophen (NORCO) 7.5-325 MG tablet Take 1 tablet by mouth every 6 (six) hours as needed for moderate pain (pain score 4-6). (Patient not taking: Reported on 01/15/2024) 30 tablet 0 Not Taking   ONETOUCH VERIO test strip 1 each by Other route as needed for other (Glucose).       Assessment: Pharmacy is consulted to start IV heparin in 78 yo male diagnosed with acute DVT involving the right radial veins and acute superficial vein thrombosis involving the right basilic vein and right cephalic vein. Pt not on anticoagulation PTA . There has also been concerns for GI bleed. Per consult, no bolus due to  anemia.   Today, 01/24/24 HL 0.58, therapeutic on heparin 2000 units/hr CBC:  Hgb down to 8.9, Plt WNL No complications of therapy noted, RN reports no bleeding.  SCr down to 2.85   Goal of Therapy:  Heparin level 0.3-0.7 units/ml Monitor platelets by anticoagulation protocol: Yes   Plan:  Continue heparin infusion at 2000 units/hr  Daily CBC, heparin level while on heparin drip Monitor for signs and symptoms of bleeding   Lynann Beaver PharmD, BCPS [facility] main pharmacy [phone] 01/24/2024 7:30 AM

## 2024-01-24 NOTE — Plan of Care (Signed)
   Problem: Clinical Measurements: Goal: Diagnostic test results will improve Outcome: Progressing

## 2024-01-24 NOTE — Progress Notes (Addendum)
 PHARMACY - ANTICOAGULATION CONSULT NOTE  Pharmacy Consult for IV heparin >> Eliquis Indication: VTE  Allergies  Allergen Reactions   Canagliflozin Other (See Comments)    Pt reports that the use of Invokana caused acute KIDNEY FAILURE ([facility] records) and "allergic," per [facility]   Aspirin Other (See Comments)    Gastroesophageal reflux and "allergic," per [facility]    Atorvastatin Other (See Comments)    Myalgias, GI upset, and "allergic," per [facility]   Statins Other (See Comments)    Muscle pain- "allergic," per [facility]    Patient Measurements: Height: 5\' 11"  (180.3 cm) Weight: 97 kg (213 lb 12.8 oz) IBW/kg (Calculated) : 75.3 HEPARIN DW (KG): 95  Vital Signs: Temp: 98.2 F (36.8 C) (04/08 1151) Temp Source: Oral (04/08 1151) BP: 158/76 (04/08 1151) Pulse Rate: 72 (04/08 1151)  Labs: Recent Labs    01/22/24 0244 01/23/24 0443 01/24/24 0453  HGB 9.2* 9.8* 8.9*  HCT 28.5* 30.0* 28.4*  PLT 311 316 316  HEPARINUNFRC 0.43 0.50 0.58  CREATININE 2.97* 3.01* 2.85*    Estimated Creatinine Clearance: 24.6 mL/min (A) (by C-G formula based on SCr of 2.85 mg/dL (H)).   Medical History: Past Medical History:  Diagnosis Date   Anemia    Essential hypertension 07/18/2017   Foot drop, left foot    due to back surgery in 01/2021   GERD (gastroesophageal reflux disease)    History of colonic polyps    History of hiatal hernia    many years ago   History of rheumatic fever    History of stroke 07/18/2017   Hypertriglyceridemia 07/18/2017   S/P TAVR (transcatheter aortic valve replacement) 01/26/2022   [name] 29mm via TF approach with Dr. Lynnette Caffey and Dr. Laneta Simmers   Severe aortic stenosis    Type 2 diabetes mellitus with diabetic neuropathy, unspecified (HCC) 07/18/2017    Medications:  Medications Prior to Admission  Medication Sig Dispense Refill Last Dose/Taking   acetaminophen (TYLENOL) 325 MG tablet Take 650 mg by mouth every 6 (six) hours as needed for mild pain or headache.    01/15/2024 Morning   aspirin EC 81 MG tablet Take 1 tablet (81 mg total) by mouth daily. Swallow whole. 90 tablet 3 01/14/2024 Morning   B Complex Vitamins (B COMPLEX PO) Take 1 tablet by mouth daily.   01/14/2024 Morning   Cholecalciferol (VITAMIN D3 SUPER STRENGTH) 50 MCG (2000 UT) CAPS Take 2,000 Units by mouth daily.   01/14/2024 Morning   escitalopram (LEXAPRO) 20 MG tablet Take 1 tablet (20 mg total) by mouth daily. 90 tablet 2 01/14/2024 Evening   etanercept (ENBREL MINI) 50 MG/ML injection Inject 1 mL (50 mg total) into the skin once a week. 12 mL 0 01/09/2024   ezetimibe (ZETIA) 10 MG tablet Take 1 tablet (10 mg total) by mouth daily. 90 tablet 3 01/14/2024 Evening   fenofibrate micronized (LOFIBRA) 200 MG capsule TAKE 1 CAPSULE DAILY BEFOREBREAKFAST 90 capsule 3 01/14/2024 Morning   finasteride (PROSCAR) 5 MG tablet Take 1 tablet (5 mg total) by mouth daily. 90 tablet 3 01/11/2024   HYDROcodone-acetaminophen (NORCO) 7.5-325 MG tablet Take 1 tablet by mouth every 6 (six) hours as needed for moderate pain (pain score 4-6). 30 tablet 0 01/14/2024 Bedtime   insulin aspart (NOVOLOG FLEXPEN) 100 UNIT/ML FlexPen Inject 0-20 Units into the skin See admin instructions. Inject 0-10 units into the skin before meals, PER SLIDING SCALE: BGL 0-200 = give nothing, 201-250 = 2 units, 251-300 = 4 units, 301-350 =  6 units, 351-400 = 8 units, >400 = 10 units, push fluids, and repeat BGL check in 2 hours. If BGL remains >400 after 2 hours, CALL MD.   01/14/2024 Bedtime   insulin degludec (TRESIBA FLEXTOUCH) 100 UNIT/ML FlexTouch Pen Inject 20 Units into the skin daily.   01/14/2024 Bedtime   leflunomide (ARAVA) 10 MG tablet TAKE 1 TABLET DAILY 90 tablet 0 01/14/2024 Morning   lisinopril (ZESTRIL) 40 MG tablet Take 1 tablet (40 mg total) by mouth in the morning. 90 tablet 3 01/14/2024 Morning   magnesium oxide (MAG-OX) 400 MG tablet Take 400 mg by mouth daily with lunch.   01/14/2024 Evening   melatonin 5 MG TABS Take 5 mg by  mouth at bedtime.   01/14/2024 Bedtime   Multiple Vitamins-Minerals (MULTIVITAMIN WITH MINERALS) tablet Take 1 tablet by mouth daily with breakfast.   01/14/2024 Morning   Omega 3 1000 MG CAPS Take 2,000 mg by mouth daily with lunch.   01/14/2024 Morning   omeprazole (PRILOSEC) 20 MG capsule Take 1 capsule (20 mg total) by mouth daily. 90 capsule 3 01/14/2024 Morning   Potassium 99 MG TABS Take 99 mg by mouth every evening.   01/14/2024 Noon   vitamin B-12 (CYANOCOBALAMIN) 1000 MCG tablet Take 1,000 mcg by mouth daily.   01/14/2024 Morning   vitamin E 180 MG (400 UNITS) capsule Take 400 Units by mouth daily.   01/14/2024 Morning   amoxicillin (AMOXIL) 500 MG tablet Take 4 tablets (2,000 mg total) by mouth as directed. 1 HOUR PRIOR TO DENTAL APPOINTMENTS (Patient not taking: Reported on 01/15/2024) 12 tablet 6 Not Taking   [EXPIRED] cefdinir (OMNICEF) 300 MG capsule Take 1 capsule (300 mg total) by mouth 2 (two) times daily for 7 days. 14 capsule 0    fluticasone (FLONASE) 50 MCG/ACT nasal spray Place 2 sprays into both nostrils daily. (Patient not taking: Reported on 01/15/2024) 16 g 6 Not Taking   HYDROcodone-acetaminophen (NORCO) 7.5-325 MG tablet Take 1 tablet by mouth every 6 (six) hours as needed for moderate pain (pain score 4-6). (Patient not taking: Reported on 01/15/2024) 30 tablet 0 Not Taking   HYDROcodone-acetaminophen (NORCO) 7.5-325 MG tablet Take 1 tablet by mouth every 6 (six) hours as needed for moderate pain (pain score 4-6). (Patient not taking: Reported on 01/15/2024) 30 tablet 0 Not Taking   ONETOUCH VERIO test strip 1 each by Other route as needed for other (Glucose).       Assessment: Pharmacy is consulted to start IV heparin in 78 yo male diagnosed with acute DVT involving the right radial veins and acute superficial vein thrombosis involving the right basilic vein and right cephalic vein. Pt not on anticoagulation PTA . There has also been concerns for GI bleed. Per consult, no heparin  bolus due to anemia.   Today, 01/24/24 HL 0.58, therapeutic on heparin 2000 units/hr CBC:  Hgb down to 8.9, Plt WNL No complications of therapy noted, RN reports no bleeding.  SCr down to 2.85   Goal of Therapy:  Heparin level 0.3-0.7 units/ml Monitor platelets by anticoagulation protocol: Yes   Plan:  Stop heparin gtt now Start Eliquis 5mg  PO BID Discussed with MD and given patient has been on therapeutic heparin x4 days and concerns for GIB this admission, will NOT give Eliquis loading dose.  Monitor for signs and symptoms of bleeding daily   Cherylin Mylar, PharmD Clinical Pharmacist  4/8/20254:45 PM

## 2024-01-24 NOTE — TOC Progression Note (Signed)
 Transition of Care [facility]) - Progression Note    Patient Details  Name: Tommy Green MRN: [id number] Date of Birth: Sep 04, 1946  [facility] CM/SW Contact  Amada Jupiter, LCSW Phone Number: 01/24/2024, 12:56 PM  Clinical Narrative:     Met with pt and wife today to discuss option of SNF rehab prior to return home.  Pt listening, however, does not really engage.  Wife agreeable with therapy recommendations for SNF rehab and requesting [facility] if possible (she worked there).  Have sent referral facility and message to admissions coordinator.  Continue to follow.  Per MD, pt may be medically ready for dc in 1-2 days.  Expected Discharge Plan: Home/Self Care Barriers to Discharge: Continued Medical Work up  Expected Discharge Plan and Services   Discharge Planning Services: CM Consult   Living arrangements for the past 2 months: Single Family Home                                       Social Determinants of Health (SDOH) Interventions SDOH Screenings   Food Insecurity: No Food Insecurity (01/16/2024)  Housing: Low Risk  (01/16/2024)  Transportation Needs: No Transportation Needs (01/16/2024)  Utilities: Not At Risk (01/16/2024)  Alcohol Screen: Low Risk  (11/04/2023)  Depression (PHQ2-9): Low Risk  (01/03/2024)  Financial Resource Strain: Low Risk  (11/04/2023)  Physical Activity: Inactive (11/04/2023)  Social Connections: Socially Integrated (01/15/2024)  Stress: No Stress Concern Present (11/04/2023)  Tobacco Use: Medium Risk (01/17/2024)  Health Literacy: Adequate Health Literacy (11/04/2023)    Readmission Risk Interventions    01/16/2024   12:29 PM  Readmission Risk Prevention Plan  Transportation Screening Complete  PCP or Specialist Appt within 5-7 Days Complete  Home Care Screening Complete  Medication Review (RN CM) Complete

## 2024-01-24 NOTE — Progress Notes (Addendum)
 PROGRESS NOTE    Tommy Green  XLK:[id number] DOB: 06/25/1946 DOA: 01/15/2024 PCP: [name], DO  Chief Complaint  Patient presents with   Urinary Frequency   Fall    Brief Narrative:    78 yo male with PMH malignant melanoma, DMII, RA, GERD, hx CVA, HLD who presented with weakness, recurrent falls, and some persistent dark stools.   He had fallen about 1 week prior in the bathroom and struck his right face resulting in Yzabella Crunk bruised eye as well.   Noted to have significant anemia with hb 6.5 on presentation.  He's been transfused for his anemia.  He had extensive imaging with relationship to his fall.  He was incidentally  noted to have liver lesions concerning for infection vs malignancy.  Now s/p bx by IR, path consistent with abscess.    Currently on antibiotics.  Hospitalization complicated by AKI on CKD which appears to be related to obstruction (foley catheter placed today).  He's s/p EGD by GI for concern for GI bleed in the setting of his anemia as noted below.   Found to have right upper extremity DVT.  Additional issues noted below.  Discharge is pending improvement in renal function, transition to PO anticoagulation.  Suspect he'll be ready for d/c to SNF by the end of the week once placement arranged.    Assessment & Plan:   Principal Problem:   GI bleed Active Problems:   Acute renal failure superimposed on stage 3b chronic kidney disease (HCC)   Liver mass   Lung nodule   Essential hypertension   Hyperlipidemia associated with type 2 diabetes mellitus (HCC)   Type 2 diabetes mellitus with diabetic neuropathy, unspecified (HCC)   S/P TAVR (transcatheter aortic valve replacement)   BPH associated with nocturia   Rheumatoid arteritis (HCC)   Heme positive stool   Abnormal CT of liver   Acute on chronic anemia   Liver abscess   Urinary tract infection with hematuria  Goals of Care Plan for SNF.  Appreciate palliative assistance.  Plan for  palliative to follow outpatient.   Liver Lesion Liver Abscess  E. Coli Infection Notably, lives in [place], was told not to drink water recently due to e. Coli contamination? (But they still drank water) MRI 3/31 with multilobulated, internally septated rim enhancing lesion within the inferior right lobe of the liver, hepatic segment V/VI measuring 4.5x3.7 cm.  Second small component which is inserpatble from this larger lesion and the liver capsule situated just inferiorly measuring 2/5x2.4 cm.  Abscess vs cystic metastasis.  Small enhancing lesion in the anterior right lobe of the liver. S/p US aspiration and core biopsy 4/2 - follow pathology (reactive fibrosis and inflammation c/w abscess) Rare gram negative rods on gram stain, concerning for liver abscess (though no pus able to be aspirated yesterday) - follow final culture results (e. Coli, resistant to amp/unasyn) Currently on abx Appreciate ID consult - transition to oral abx when culture results (appears fluoroquinolone may be best option for PO with his renal function), recommending 4 weeks abx with 1 refill at discharge  Concern for GI Bleed  Anemia  Iron Def Presented with Hb 6.5 on admission (was ~8-9 previously) S/p 3 units pRBC S/p EGD 4/1 -> with posterior oropharynx abnormal with congested mucosa and hematin, grade 1 esophageal varices noted proximally and in the middle of the esophagus.  No gross lesions in the distal esophagus.  Low grade narrowing schatzki ring.  5 cm hiatal hernia.  Erythematous mucosa in the stomach (biopsied).  No evidence of portal gastropathy or varices of the stomach.  Biopsies suggestive of iron pill gastritis, chronic peptic duodenitis.  GI recommending ENT evaluation (planning for outpatient follow up - follow up placed in discharge instructions), IR evaluation of hepatic lesion (s/p biopsy).  (See report)  Nausea  Vomiting Unclear cause, will monitor Antiemetics Plain films with nonobstructive  bowel gas pattern  DVT  Will cautiously anticoagulate -> transition to PO today, trend H/H  AKI on CKD IIIb Metabolic Acidosis Urinary Retention Baseline creatinine 1.8-2.2 Current creatinine 2.85, 2.3 L out in past 24 hrs Add bicarb.  IVF today. CT with overdistended bladder with bilateral hydro, L perinephric stranding with involvement of proximal ureter concerning for UTI  Place foley, trend renal function and UOP - 1.7 L out with foley, will need outpatient urology follow up and TOV in 7-10 days Ensure he's taking good PO  Diarrhea Having frequent stools Hold scheduled laxative Will discuss with RN  Hypokalemia Hypomagnesemia Replace and follow - attn to renal function   PVC's Run of PVC couplets 4/6, started on metoprolol  EF with preserved EF  Cognitive Deficit ? Memory issues over the past few years, she notes he's sundowned at times in the hospital, doesn't do well on seroquel.  Appropriate this AM Outpatient neurology follow up Wife prefers he discharges with something like trazodone for sleep  Mechanical Falls Therapy  Posterior oropharynx abnormal with congested mucosa and hematin  ENT planning outpatient follow up, will need to call for follow up appt (placed in discharge instructions)  Proximal Upstream Varicose Veins Unclear cause.  Warrants additional follow up - per GI can be seen with significant congestive venous issues, r/o SVC syndrome or obstructive venous outflow  Upper extremity US as above (right upper extremity DVT).  CT chest abdomen pelvis reviewed, CT head and C spine reviewed.   Lung Nodule 6x7 mm nodule in left lower lobe, warrants follow up outpatient  Rheumatoid Arthritis Enbrel chronically  E. Coli UTI On abx as above  BPH Finasteride  S/p TAVR Noted, echo with bioprosthetic AV s/p TAV, trivial perivalvular leakage  T2DM A1c 5.8 12/2023 SSI, basal insulin on hold  Hyperlipidemia Zetia  Hypertension Lisinopril on hold  with AKI on CKD BP elevated, start amlodipine    DVT prophylaxis: SCD Code Status: DNR Family Communication: wife at bedside Disposition:   Status is: Inpatient Remains inpatient appropriate because: need for continued inpatient care   Consultants:  GI IR  Procedures:  Upper endoscopy US aspiration and core biopsy   Antimicrobials:  Anti-infectives (From admission, onward)    Start     Dose/Rate Route Frequency Ordered Stop   01/20/24 1030  metroNIDAZOLE (FLAGYL) tablet 500 mg  Status:  Discontinued        500 mg Oral Every 12 hours 01/20/24 0933 01/21/24 1404   01/20/24 1000  cefTRIAXone (ROCEPHIN) 2 g in sodium chloride 0.9 % 100 mL IVPB        2 g 200 mL/hr over 30 Minutes Intravenous Every 24 hours 01/19/24 0947     01/17/24 0845  metroNIDAZOLE (FLAGYL) IVPB 500 mg  Status:  Discontinued        500 mg 100 mL/hr over 60 Minutes Intravenous 2 times daily 01/17/24 0759 01/20/24 0933   01/16/24 1000  cefTRIAXone (ROCEPHIN) 1 g in sodium chloride 0.9 % 100 mL IVPB        1 g 200 mL/hr over 30 Minutes Intravenous Every  24 hours 01/15/24 1600 01/19/24 1009   01/15/24 1000  cefTRIAXone (ROCEPHIN) 1 g in sodium chloride 0.9 % 100 mL IVPB        1 g 200 mL/hr over 30 Minutes Intravenous  Once 01/15/24 0951 01/15/24 1037       Subjective: Denies complaint at this time  Objective: Vitals:   01/23/24 1406 01/23/24 2051 01/24/24 0458 01/24/24 1151  BP: (!) 162/66 135/67 (!) 172/59 (!) 158/76  Pulse: 71 70 68 72  Resp: 16 16 16    Temp: 98.4 F (36.9 C) 97.9 F (36.6 C) 97.6 F (36.4 C) 98.2 F (36.8 C)  TempSrc: Oral Oral Oral Oral  SpO2: 96% 98% 100% 100%  Weight:      Height:        Intake/Output Summary (Last 24 hours) at 01/24/2024 1638 Last data filed at 01/24/2024 1504 Gross per 24 hour  Intake 3165.26 ml  Output 2900 ml  Net 265.26 ml   Filed Weights   01/15/24 0924  Weight: 97 kg    Examination:  General: No acute distress. Cardiovascular:  RRR Lungs: unlabored Abdomen: Soft, nontender, distended Neurological: Alert and oriented 3. Moves all extremities 4 with equal strength. Cranial nerves II through XII grossly intact. Extremities: No clubbing or cyanosis. No edema.  Data Reviewed: I have personally reviewed following labs and imaging studies  CBC: Recent Labs  Lab 01/19/24 0444 01/20/24 0443 01/21/24 0716 01/22/24 0244 01/23/24 0443 01/24/24 0453  WBC 16.2* 12.9* 12.1* 11.5* 11.5* 12.2*  NEUTROABS 13.5* 10.0* 8.6*  --  8.1* 8.7*  HGB 7.7* 7.6* 8.6* 9.2* 9.8* 8.9*  HCT 25.9* 23.7* 26.3* 28.5* 30.0* 28.4*  MCV 90.2 84.3 83.5 84.1 83.6 85.3  PLT 391 329 327 311 316 316    Basic Metabolic Panel: Recent Labs  Lab 01/20/24 0443 01/21/24 0716 01/22/24 0244 01/23/24 0443 01/24/24 0453  NA 133* 133* 133* 136 133*  K 3.3* 3.4* 3.3* 3.4* 3.2*  CL 107 106 108 108 107  CO2 15* 14* 16* 17* 17*  GLUCOSE 157* 140* 166* 114* 144*  BUN 45* 47* 43* 41* 39*  CREATININE 2.74* 2.97* 2.97* 3.01* 2.85*  CALCIUM 8.3* 8.2* 8.2* 8.3* 8.1*  MG 1.7 1.5* 1.6* 1.6* 1.5*  PHOS  --   --  3.7 3.7 3.2    GFR: Estimated Creatinine Clearance: 24.6 mL/min (Ermal Brzozowski) (by C-G formula based on SCr of 2.85 mg/dL (H)).  Liver Function Tests: Recent Labs  Lab 01/22/24 0244 01/23/24 0443 01/24/24 0453  AST 15 18 18   ALT 16 14 14   ALKPHOS 49 47 47  BILITOT 0.4 0.5 0.5  PROT 5.9* 6.1* 5.9*  ALBUMIN 2.4* 2.5* 2.5*    CBG: Recent Labs  Lab 01/23/24 1710 01/23/24 2047 01/24/24 0728 01/24/24 1148 01/24/24 1526  GLUCAP 169* 145* 155* 150* 147*     Recent Results (from the past 240 hours)  Urine Culture     Status: Abnormal   Collection Time: 01/15/24  9:31 AM   Specimen: Urine, Random  Result Value Ref Range Status   Specimen Description   Final    URINE, RANDOM Performed at [facility], [address] [place], [place] [address]    Special Requests   Final    NONE Reflexed from [id number] Performed at [facility], [address] [place], [place] [address]    Culture >=100,000 COLONIES/mL ESCHERICHIA COLI (Azai Gaffin)  Final   Report Status 01/17/2024 FINAL  Final  Organism ID, Bacteria ESCHERICHIA COLI (Tayah Idrovo)  Final      Susceptibility   Escherichia coli - MIC*    AMPICILLIN >=32 RESISTANT Resistant     CEFAZOLIN >=64 RESISTANT Resistant     CEFEPIME <=0.12 SENSITIVE Sensitive     CEFTRIAXONE <=0.25 SENSITIVE Sensitive     CIPROFLOXACIN <=0.25 SENSITIVE Sensitive     GENTAMICIN <=1 SENSITIVE Sensitive     IMIPENEM <=0.25 SENSITIVE Sensitive     NITROFURANTOIN <=16 SENSITIVE Sensitive     TRIMETH/SULFA <=20 SENSITIVE Sensitive     AMPICILLIN/SULBACTAM >=32 RESISTANT Resistant     PIP/TAZO 16 SENSITIVE Sensitive ug/mL    * >=100,000 COLONIES/mL ESCHERICHIA COLI  Blood culture (routine x 2)     Status: None   Collection Time: 01/15/24 10:05 AM   Specimen: BLOOD LEFT ARM  Result Value Ref Range Status   Specimen Description   Final    BLOOD LEFT ARM Performed at [facility], [address] [place], [place] [address]    Special Requests   Final    BOTTLES DRAWN AEROBIC AND ANAEROBIC Blood Culture adequate volume Performed at [facility], [address] [place], [place] [address]    Culture   Final    NO GROWTH 5 DAYS Performed at [facility], [address], [place], [place] [address]    Report Status 01/20/2024 FINAL  Final  Blood culture (routine x 2)     Status: None   Collection Time: 01/15/24 10:05 AM   Specimen: BLOOD RIGHT ARM  Result Value Ref Range Status   Specimen Description   Final    BLOOD RIGHT ARM Performed at [facility], [address] [place], [place] [address]    Special Requests   Final    BOTTLES DRAWN AEROBIC AND ANAEROBIC Blood Culture adequate volume Performed at [facility], [address], [place], [place] [address]    Culture   Final    NO GROWTH 5 DAYS Performed at [facility],  [address], [place], [place] [address]    Report Status 01/20/2024 FINAL  Final  Aerobic/Anaerobic Culture w Gram Stain (surgical/deep wound)     Status: None   Collection Time: 01/18/24  3:07 PM   Specimen: Abscess  Result Value Ref Range Status   Specimen Description   Final    ABSCESS Performed at [facility], [address], [place], [place] [address]    Special Requests   Final    NONE Performed at [facility], [address], [place], [place] [address]    Gram Stain   Final    MODERATE WBC PRESENT, PREDOMINANTLY PMN RARE GRAM NEGATIVE RODS    Culture   Final    MODERATE ESCHERICHIA COLI NO ANAEROBES ISOLATED Performed at [facility], [address], [place], [place] [address]    Report Status 01/23/2024 FINAL  Final   Organism ID, Bacteria ESCHERICHIA COLI  Final   Organism ID, Bacteria ESCHERICHIA COLI  Final      Susceptibility   Escherichia coli - MIC*    AMPICILLIN >=32 RESISTANT Resistant     CEFEPIME <=0.12 SENSITIVE Sensitive     CEFTAZIDIME <=1 SENSITIVE Sensitive     CEFTRIAXONE <=0.25 SENSITIVE Sensitive     CIPROFLOXACIN <=0.25 SENSITIVE Sensitive     GENTAMICIN <=1 SENSITIVE Sensitive     IMIPENEM <=0.25 SENSITIVE Sensitive  TRIMETH/SULFA <=20 SENSITIVE Sensitive     AMPICILLIN/SULBACTAM >=32 RESISTANT Resistant     PIP/TAZO 16 SENSITIVE Sensitive ug/mL   Escherichia coli - KIRBY BAUER*    CEFAZOLIN RESISTANT Resistant     * MODERATE ESCHERICHIA COLI    MODERATE ESCHERICHIA COLI         Radiology Studies: CT RENAL STONE STUDY Result Date: 01/24/2024 CLINICAL DATA:  Acute renal injury and urinary frequency EXAM: CT ABDOMEN AND PELVIS WITHOUT CONTRAST TECHNIQUE: Multidetector CT imaging of the abdomen and pelvis was performed following the standard protocol without IV contrast. RADIATION DOSE REDUCTION: This exam was performed according to the departmental dose-optimization program which includes  automated exposure control, adjustment of the mA and/or kV according to patient size and/or use of iterative reconstruction technique. COMPARISON:  01/15/2024 FINDINGS: Lower chest: Minimal pleural effusions are noted bilaterally. No focal infiltrate is seen. Hepatobiliary: Gallbladder has been surgically removed. Persistent hypodense mass lesion is noted within the right hepatic lobe overall stable in appearance from the prior exam. Pancreas: Unremarkable. No pancreatic ductal dilatation or surrounding inflammatory changes. Spleen: Normal in size without focal abnormality. Adrenals/Urinary Tract: Adrenal glands are within normal limits. Kidneys are well visualized bilaterally with stable simple cyst in the upper pole of the left kidney. No further follow-up is recommended. No renal calculi are seen. Fullness of the collecting systems is noted bilaterally likely related to over distention of the bladder as no calculi are seen. Increase in perinephric stranding on the left is noted which could be related to an underlying UTI. Correlation with laboratory values is recommended. Stomach/Bowel: Scattered diverticular change of the colon is noted. No evidence of diverticulitis is seen. The appendix is well visualized and within normal limits. Small bowel and stomach show no evidence of small sliding-type hiatal hernia. Vascular/Lymphatic: Aortic atherosclerosis. No enlarged abdominal or pelvic lymph nodes. Reproductive: Prostate is unremarkable. Other: No abdominal wall hernia or abnormality. No abdominopelvic ascites. Musculoskeletal: Postsurgical changes and degenerative changes are noted in the thoracolumbar spine. IMPRESSION: Over distended bladder with associated hydronephrosis bilaterally. Increased perinephric stranding on the left with some involvement of the proximal ureter suspicious for underlying UTI. Stable hypodense mass lesion within the liver as previously seen on CT and MRI. Electronically Signed   By:  Alcide Clever M.D.   On: 01/24/2024 10:49         Scheduled Meds:  amLODipine  10 mg Oral Daily   escitalopram  20 mg Oral Daily   ezetimibe  10 mg Oral QPM   fenofibrate  54 mg Oral Daily   finasteride  5 mg Oral Daily   insulin aspart  0-15 Units Subcutaneous TID WC   insulin aspart  0-5 Units Subcutaneous QHS   magnesium oxide  400 mg Oral Daily   melatonin  5 mg Oral QHS   metoprolol tartrate  12.5 mg Oral BID   pantoprazole  40 mg Oral Daily   sodium bicarbonate  650 mg Oral TID   traZODone  25 mg Oral QHS   Continuous Infusions:  cefTRIAXone (ROCEPHIN)  IV 2 g (01/24/24 0925)   heparin 2,000 Units/hr (01/23/24 2151)   promethazine (PHENERGAN) injection (IM or IVPB) 6.25 mg (01/20/24 1157)     LOS: 9 days    Time spent: over 30 min    Lacretia Nicks, MD [facility]   To contact the attending provider between 7A-7P or the covering provider during after hours 7P-7A, please log into the web site [email or web address] and access using universal  Gateway password for that web site. If you do not have the password, please call the hospital operator.  01/24/2024, 4:38 PM

## 2024-01-25 ENCOUNTER — Other Ambulatory Visit (HOSPITAL_COMMUNITY): Payer: Self-pay

## 2024-01-25 ENCOUNTER — Telehealth (HOSPITAL_COMMUNITY): Payer: Self-pay | Admitting: Pharmacy Technician

## 2024-01-25 DIAGNOSIS — I82621 Acute embolism and thrombosis of deep veins of right upper extremity: Secondary | ICD-10-CM | POA: Diagnosis not present

## 2024-01-25 DIAGNOSIS — K750 Abscess of liver: Secondary | ICD-10-CM

## 2024-01-25 DIAGNOSIS — B9620 Unspecified Escherichia coli [E. coli] as the cause of diseases classified elsewhere: Secondary | ICD-10-CM

## 2024-01-25 DIAGNOSIS — B9689 Other specified bacterial agents as the cause of diseases classified elsewhere: Secondary | ICD-10-CM

## 2024-01-25 DIAGNOSIS — N179 Acute kidney failure, unspecified: Secondary | ICD-10-CM | POA: Diagnosis not present

## 2024-01-25 DIAGNOSIS — R338 Other retention of urine: Secondary | ICD-10-CM

## 2024-01-25 DIAGNOSIS — Z789 Other specified health status: Secondary | ICD-10-CM

## 2024-01-25 DIAGNOSIS — N189 Chronic kidney disease, unspecified: Secondary | ICD-10-CM

## 2024-01-25 DIAGNOSIS — I82409 Acute embolism and thrombosis of unspecified deep veins of unspecified lower extremity: Secondary | ICD-10-CM

## 2024-01-25 DIAGNOSIS — K922 Gastrointestinal hemorrhage, unspecified: Secondary | ICD-10-CM | POA: Diagnosis not present

## 2024-01-25 LAB — CBC WITH DIFFERENTIAL/PLATELET
Abs Immature Granulocytes: 0.03 10*3/uL (ref 0.00–0.07)
Basophils Absolute: 0.1 10*3/uL (ref 0.0–0.1)
Basophils Relative: 1 %
Eosinophils Absolute: 0.2 10*3/uL (ref 0.0–0.5)
Eosinophils Relative: 2 %
HCT: 30.6 % — ABNORMAL LOW (ref 39.0–52.0)
Hemoglobin: 9.5 g/dL — ABNORMAL LOW (ref 13.0–17.0)
Immature Granulocytes: 0 %
Lymphocytes Relative: 24 %
Lymphs Abs: 2.2 10*3/uL (ref 0.7–4.0)
MCH: 26.9 pg (ref 26.0–34.0)
MCHC: 31 g/dL (ref 30.0–36.0)
MCV: 86.7 fL (ref 80.0–100.0)
Monocytes Absolute: 0.6 10*3/uL (ref 0.1–1.0)
Monocytes Relative: 7 %
Neutro Abs: 6 10*3/uL (ref 1.7–7.7)
Neutrophils Relative %: 66 %
Platelets: 309 10*3/uL (ref 150–400)
RBC: 3.53 MIL/uL — ABNORMAL LOW (ref 4.22–5.81)
RDW: 18.1 % — ABNORMAL HIGH (ref 11.5–15.5)
WBC: 9.1 10*3/uL (ref 4.0–10.5)
nRBC: 0 % (ref 0.0–0.2)

## 2024-01-25 LAB — COMPREHENSIVE METABOLIC PANEL WITH GFR
ALT: 15 U/L (ref 0–44)
AST: 19 U/L (ref 15–41)
Albumin: 2.4 g/dL — ABNORMAL LOW (ref 3.5–5.0)
Alkaline Phosphatase: 49 U/L (ref 38–126)
Anion gap: 9 (ref 5–15)
BUN: 31 mg/dL — ABNORMAL HIGH (ref 8–23)
CO2: 19 mmol/L — ABNORMAL LOW (ref 22–32)
Calcium: 8 mg/dL — ABNORMAL LOW (ref 8.9–10.3)
Chloride: 104 mmol/L (ref 98–111)
Creatinine, Ser: 2.26 mg/dL — ABNORMAL HIGH (ref 0.61–1.24)
GFR, Estimated: 29 mL/min — ABNORMAL LOW (ref >60)
Glucose, Bld: 123 mg/dL — ABNORMAL HIGH (ref 70–99)
Potassium: 3.2 mmol/L — ABNORMAL LOW (ref 3.5–5.1)
Sodium: 132 mmol/L — ABNORMAL LOW (ref 135–145)
Total Bilirubin: 0.4 mg/dL (ref 0.0–1.2)
Total Protein: 5.7 g/dL — ABNORMAL LOW (ref 6.5–8.1)

## 2024-01-25 LAB — MAGNESIUM: Magnesium: 1.5 mg/dL — ABNORMAL LOW (ref 1.7–2.4)

## 2024-01-25 LAB — PHOSPHORUS: Phosphorus: 2.9 mg/dL (ref 2.5–4.6)

## 2024-01-25 LAB — GLUCOSE, CAPILLARY
Glucose-Capillary: 125 mg/dL — ABNORMAL HIGH (ref 70–99)
Glucose-Capillary: 151 mg/dL — ABNORMAL HIGH (ref 70–99)
Glucose-Capillary: 156 mg/dL — ABNORMAL HIGH (ref 70–99)
Glucose-Capillary: 130 mg/dL — ABNORMAL HIGH (ref 70–99)

## 2024-01-25 MED ORDER — MAGNESIUM SULFATE 2 GM/50ML IV SOLN
2.0000 g | Freq: Once | INTRAVENOUS | Status: AC
  Administered 2024-01-25: 2 g via INTRAVENOUS
  Filled 2024-01-25: qty 50

## 2024-01-25 MED ORDER — METRONIDAZOLE 500 MG PO TABS
500.0000 mg | ORAL_TABLET | Freq: Two times a day (BID) | ORAL | Status: DC
  Administered 2024-01-25 – 2024-01-26 (×3): 500 mg via ORAL
  Filled 2024-01-25 (×3): qty 1

## 2024-01-25 MED ORDER — POTASSIUM CHLORIDE CRYS ER 20 MEQ PO TBCR
40.0000 meq | EXTENDED_RELEASE_TABLET | Freq: Once | ORAL | Status: AC
  Administered 2024-01-25: 40 meq via ORAL
  Filled 2024-01-25: qty 2

## 2024-01-25 MED ORDER — CIPROFLOXACIN HCL 500 MG PO TABS
500.0000 mg | ORAL_TABLET | ORAL | Status: DC
  Administered 2024-01-26: 500 mg via ORAL
  Filled 2024-01-25: qty 1

## 2024-01-25 NOTE — TOC Progression Note (Addendum)
 Transition of Care [facility]) - Progression Note    Patient Details  Name: Tommy Green MRN: [id number] Date of Birth: [birth year]  [facility] CM/SW Contact  Amada Jupiter, LCSW Phone Number: 01/25/2024, 1:53 PM  Clinical Narrative:     Confirmed with pt and wife that [facility] has extended a bed offer and they have accepted.  Per MD, pt may be medically ready for dc by tomorrow.  Have secured insurance authorization (cert# [id number]).    ADDENDUM: Referral place for outpatient palliative care with [facility] Parcel, Charity fundraiser) per pt/ wife choice.  Expected Discharge Plan: Home/Self Care Barriers to Discharge: Continued Medical Work up  Expected Discharge Plan and Services   Discharge Planning Services: CM Consult   Living arrangements for the past 2 months: Single Family Home                                       Social Determinants of Health (SDOH) Interventions SDOH Screenings   Food Insecurity: No Food Insecurity (01/16/2024)  Housing: Low Risk  (01/16/2024)  Transportation Needs: No Transportation Needs (01/16/2024)  Utilities: Not At Risk (01/16/2024)  Alcohol Screen: Low Risk  (11/04/2023)  Depression (PHQ2-9): Low Risk  (01/03/2024)  Financial Resource Strain: Low Risk  (11/04/2023)  Physical Activity: Inactive (11/04/2023)  Social Connections: Socially Integrated (01/15/2024)  Stress: No Stress Concern Present (11/04/2023)  Tobacco Use: Medium Risk (01/17/2024)  Health Literacy: Adequate Health Literacy (11/04/2023)    Readmission Risk Interventions    01/16/2024   12:29 PM  Readmission Risk Prevention Plan  Transportation Screening Complete  PCP or Specialist Appt within 5-7 Days Complete  Home Care Screening Complete  Medication Review (RN CM) Complete

## 2024-01-25 NOTE — Plan of Care (Signed)

## 2024-01-25 NOTE — Telephone Encounter (Signed)
 Patient Product/process development scientist completed.    The patient is insured through [employer]. Patient has Medicare and is not eligible for a copay card, but may be able to apply for patient assistance or Medicare RX Payment Plan (Patient Must reach out to their plan, if eligible for payment plan), if available.    Ran test claim for Eliquis 5 mg and the current 30 day co-pay is $152.71.   This test claim was processed through [facility]- copay amounts may vary at other pharmacies due to pharmacy/plan contracts, or as the patient moves through the different stages of their insurance plan.     Roland Earl, CPHT Pharmacy Technician III Certified Patient Advocate [facility] Patient Advocate Team Direct Number: [phone]  Fax: [phone]

## 2024-01-25 NOTE — Discharge Instructions (Signed)
Information on my medicine - ELIQUIS (apixaban)  Why was Eliquis prescribed for you? Eliquis was prescribed to treat blood clots that may have been found in the veins of your legs (deep vein thrombosis) or in your lungs (pulmonary embolism) and to reduce the risk of them occurring again.  What do You need to know about Eliquis ? The dose is one 5 mg tablet taken TWICE daily.  Eliquis may be taken with or without food.   Try to take the dose about the same time in the morning and in the evening. If you have difficulty swallowing the tablet whole please discuss with your pharmacist how to take the medication safely.  Take Eliquis exactly as prescribed and DO NOT stop taking Eliquis without talking to the doctor who prescribed the medication.  Stopping may increase your risk of developing a new blood clot.  Refill your prescription before you run out.  After discharge, you should have regular check-up appointments with your healthcare provider that is prescribing your Eliquis.    What do you do if you miss a dose? If a dose of ELIQUIS is not taken at the scheduled time, take it as soon as possible on the same day and twice-daily administration should be resumed. The dose should not be doubled to make up for a missed dose.  Important Safety Information A possible side effect of Eliquis is bleeding. You should call your healthcare provider right away if you experience any of the following: Bleeding from an injury or your nose that does not stop. Unusual colored urine (red or dark brown) or unusual colored stools (red or black). Unusual bruising for unknown reasons. A serious fall or if you hit your head (even if there is no bleeding).  Some medicines may interact with Eliquis and might increase your risk of bleeding or clotting while on Eliquis. To help avoid this, consult your healthcare provider or pharmacist prior to using any new prescription or non-prescription medications,  including herbals, vitamins, non-steroidal anti-inflammatory drugs (NSAIDs) and supplements.  This website has more information on Eliquis (apixaban): http://www.eliquis.com/eliquis/home  

## 2024-01-25 NOTE — Progress Notes (Signed)
 Physical Therapy Treatment Patient Details Name: [facility] MRN: [id number] DOB: 11/13/1945 Today's Date: 01/25/2024   History of Present Illness 78 yo with progressive decline in subjective health; GI bleed (Hgb 6.1 on admission) and recent fall. Imaging revealed liver with lesions concerning for liver mets - workup underway. RUE DVT 01/19/24.  PMH: AS s/p tavr 01/2022, RA, gerd, dm2, cva, malignant melanoma, iron deficiency anemia, esophageal varices/portal hypertensive gastropathy, recent possible uti.    PT Comments  Pt appears more alert and engaged this am, wife present and encouraging. Pt continues to require ModA for supine to sit. (Pt has an adjustable bed at home, no rails). Minimal c/o LBP upon sitting. Able to stand from raised bed after 2 attempts with ModA to RW. No dizziness in standing and able to progress gait training in hallway ~69ft with RW and close CGA due to high fall risk. Pt returned to room, required assistance to again ambulate into bathroom with verbal and visual cues for safety. Mod?MaxA to sand from low toilet and receive assist for hygiene. (Nursing present to assist). Pt positioned to comfort in bedside chair for lunch. Overall, great tolerance for increased activity this am. Initial recs for STR upon d/c remain appropriate.    If plan is discharge home, recommend the following: A lot of help with walking and/or transfers;A lot of help with bathing/dressing/bathroom;Assistance with cooking/housework;Assist for transportation;Help with stairs or ramp for entrance   Can travel by private vehicle     No  Equipment Recommendations  None recommended by PT    Recommendations for Other Services       Precautions / Restrictions Precautions Precautions: Fall Restrictions Weight Bearing Restrictions Per Provider Order: No     Mobility  Bed Mobility Overal bed mobility: Needs Assistance Bed Mobility: Supine to Sit     Supine to sit: Mod assist     General  bed mobility comments: Pt's home bed is adjustable no rails    Transfers Overall transfer level: Needs assistance Equipment used: Rolling walker (2 wheels) Transfers: Sit to/from Stand Sit to Stand: Mod assist           General transfer comment: Mod/MaxA from low toilet.    Ambulation/Gait Ambulation/Gait assistance: Min assist Gait Distance (Feet): 70 Feet Assistive device: Rolling walker (2 wheels) Gait Pattern/deviations: Decreased step length - left, Decreased stride length, Decreased dorsiflexion - left, Decreased step length - right, Narrow base of support Gait velocity: decreased     General Gait Details: Wife states chronic L LE weakness. Pt slightly tremulous in B LE's during gait, no buckling noted   Stairs             Wheelchair Mobility     Tilt Bed    Modified Rankin (Stroke Patients Only)       Balance Overall balance assessment: History of Falls, Needs assistance Sitting-balance support: Feet supported Sitting balance-Leahy Scale: Fair     Standing balance support: During functional activity, Bilateral upper extremity supported, Reliant on assistive device for balance Standing balance-Leahy Scale: Fair Standing balance comment: Pt able to statically stand at [facility] for assist with hygiene after toilet use                            Communication Communication Communication: No apparent difficulties  Cognition Arousal: Alert Behavior During Therapy: WFL for tasks assessed/performed   PT - Cognitive impairments: Memory, Safety/Judgement, Orientation  PT - Cognition Comments: Pt's wife presesnt who states he occasionally sundowns and has some STM loss Following commands: Intact      Cueing Cueing Techniques: Verbal cues, Tactile cues, Visual cues  Exercises General Exercises - Lower Extremity Ankle Circles/Pumps: AROM, Both, 10 reps Long Arc Quad: AROM, Both, 10 reps    General Comments  General comments (skin integrity, edema, etc.): Indwelling cath intact and secured      Pertinent Vitals/Pain Pain Assessment Pain Assessment: Faces Faces Pain Scale: Hurts a little bit Pain Location: chronic back pain Pain Descriptors / Indicators: Discomfort, Grimacing, Guarding Pain Intervention(s): Monitored during session    Home Living                          Prior Function            PT Goals (current goals can now be found in the care plan section) Acute Rehab PT Goals Patient Stated Goal: to get strong enough to go home Progress towards PT goals: Progressing toward goals    Frequency    Min 3X/week      PT Plan      Co-evaluation              AM-PAC PT "6 Clicks" Mobility   Outcome Measure  Help needed turning from your back to your side while in a flat bed without using bedrails?: A Lot Help needed moving from lying on your back to sitting on the side of a flat bed without using bedrails?: A Lot Help needed moving to and from a bed to a chair (including a wheelchair)?: A Lot Help needed standing up from a chair using your arms (e.g., wheelchair or bedside chair)?: A Lot Help needed to walk in hospital room?: A Little Help needed climbing 3-5 steps with a railing? : Total 6 Click Score: 12    End of Session Equipment Utilized During Treatment: Gait belt Activity Tolerance: Patient tolerated treatment well;Patient limited by fatigue Patient left: in chair;with chair alarm set;with call bell/phone within reach;with family/visitor present Nurse Communication: Mobility status PT Visit Diagnosis: Unsteadiness on feet (R26.81);Difficulty in walking, not elsewhere classified (R26.2);Muscle weakness (generalized) (M62.81);Other abnormalities of gait and mobility (R26.89);Pain Pain - part of body:  (back)     Time: 1610-9604 PT Time Calculation (min) (ACUTE ONLY): 44 min  Charges:    $Gait Training: 8-22 mins $Therapeutic Exercise: 8-22  mins $Therapeutic Activity: 8-22 mins PT General Charges $$ ACUTE PT VISIT: 1 Visit                    Zadie Cleverly, PTA  Jannet Askew 01/25/2024, 12:36 PM

## 2024-01-25 NOTE — Assessment & Plan Note (Signed)
-   right acute DVT right radial vein and SVT involving basilic and cephalic vein -Continue on Eliquis for at least 3 months

## 2024-01-25 NOTE — Progress Notes (Signed)
 Progress Note    [facility]   YQM:[id number]  DOB: [birth year]  DOA: 01/15/2024     10 PCP: Tommy Dory, DO  Initial CC: weakness, falls, dark stools  Hospital Course: [facility] is a 78 yo male with PMH malignant melanoma, DMII, RA, GERD, hx CVA, HLD who presented with weakness, recurrent falls, and some persistent dark stools.  He had fallen about 1 week prior in the bathroom and struck his right face resulting in a bruised eye as well.  On workup he was noted to have Hgb 6.5 g/dL and underwent 2 units PRBC on admission. Multiple imaging studies were obtained due to his falls and no traumatic injuries were noted. He was however noted to have a right hepatic lobe lesion measuring 4.2 x 4.8 cm along with a left lower lobe nodule measuring 6 x 7 mm. GI was also consulted due to concern for GI bleed.  He has a known history of esophageal varices,  Schatzki ring, and gastric polyps on last EGD in Nov 2021.   He was admitted for ongoing GI evaluation and further workup regarding masses noted on scans.  Interval History:  Patient known to me from last week.  Overall has had extensive workup regarding liver lesion which is turned out to be liver abscess.  He will be on prolonged antibiotics at discharge.  Also on Eliquis for RUE DVT. Wife present bedside and we are tentatively planning on discharge to rehab tomorrow.  Assessment and Plan: * GI bleed - Possible upper GIB versus anemia from underlying suspected malignancy -Hemoglobin 6.5 g/dL on admission.  He does note dark stools been going on for a while at home; there has been associated weight loss as well - Received 2 units PRBC on admission initially  - GI consulted on admission also - EGD on 4/1: Grade 1 esophageal varices, Schatzki ring at GE junction.  Patchy erythematous mucosa in stomach with no bleeding - Biopsies suggestive of iron pill gastritis, chronic peptic duodenitis.  GI recommending ENT evaluation  (planning for outpatient follow up - follow up placed in discharge instructions), IR evaluation of hepatic lesion (s/p biopsy).  (See report)   Hepatic abscess - Patient and wife aware of CT findings notably right hepatic lobe mass measuring 4.2 cm x 4.8 cm and left lower lobe lung nodule measuring 6 x 7 mm; CT also mentions suspected nodule measuring 1.4 x 1.7 cm in the right upper quadrant abutting inferior liver surface - Liver MRI on 3/31 noting this may be hepatic abscesses as they are rim enhancing lesions - biopsy obtained 4/2 of liver and findings consistent with abscess; culture also grew e.coli - ID consulted; patient to be on cipro/flagyl at discharge for 4 weeks (1 refill) with outpt followup planned with ID on 4/22  AKI (acute kidney injury) (HCC) - patient has history of CKD3b. Baseline creat ~ 1.8 - 2.2, eGFR~ 32 - patient presents with increase in creat >0.3 mg/dL above baseline or creat increase >1.5x baseline presumed to have occurred within past 7 days PTA - creat 2.49 on admission - suspect volume depletion but may also progressive CKD given borderline GFR recently and at risk for progression - creat now stable   DVT (deep venous thrombosis) (HCC) - right acute DVT right radial vein and SVT involving basilic and cephalic vein -Continue on Eliquis for at least 3 months  Lung nodule - per CT on 3/30: "subpleural solid noncalcified 6 x 7 mm nodule in the  left lung lower lobe, which is grossly unchanged since the prior study but new since the prior study from 12/18/2021" - will plan on outpatient repeat CT chest in 2-3 months then possible biopsy after if still warranted   Acute urinary retention - Likely in setting of prolonged hospitalization and significant deconditioning - Continue Foley at discharge and will need trial of void in about 1 week  Rheumatoid arteritis (HCC) - On Enbrel at home -Intermittent pain medications, database reviewed  BPH associated with  nocturia - Continue finasteride  S/P TAVR (transcatheter aortic valve replacement) - Prior history of severe AS  Type 2 diabetes mellitus with diabetic neuropathy, unspecified (HCC) - Continue SSI and CBG monitoring for now -Last A1c 5.8% on 01/15/2024 -Hold basal insulin for now  Hyperlipidemia associated with type 2 diabetes mellitus (HCC) - On Zetia, fenofibrate at home  Essential hypertension - On lisinopril at home; hold for now   Posterior oropharynx abnormal with congested mucosa and hematin  ENT planning outpatient follow up, will need to call for follow up appt (placed in discharge instructions)   Proximal Upstream Varicose Veins Unclear cause.  Warrants additional follow up - per GI can be seen with significant congestive venous issues, r/o SVC syndrome or obstructive venous outflow  Upper extremity US as above (right upper extremity DVT).  CT chest abdomen pelvis reviewed, CT head and C spine reviewed.    Old records reviewed in assessment of this patient  Antimicrobials:   DVT prophylaxis:  SCDs Start: 01/15/24 1558 apixaban (ELIQUIS) tablet 5 mg   Code Status:   Code Status: Limited: Do not attempt resuscitation (DNR) -DNR-LIMITED -Do Not Intubate/DNI   Mobility Assessment (Last 72 Hours)     Mobility Assessment     Row Name 01/25/24 1200 01/25/24 0900 01/24/24 1952 01/24/24 1545 01/24/24 1300   Does patient have an order for bedrest or is patient medically unstable -- No - Continue assessment No - Continue assessment -- --   What is the highest level of mobility based on the progressive mobility assessment? Level 5 (Walks with assist in room/hall) - Balance while stepping forward/back and can walk in room with assist - Complete Level 5 (Walks with assist in room/hall) - Balance while stepping forward/back and can walk in room with assist - Complete Level 4 (Walks with assist in room) - Balance while marching in place and cannot step forward and back - Complete  Level 4 (Walks with assist in room) - Balance while marching in place and cannot step forward and back - Complete Level 4 (Walks with assist in room) - Balance while marching in place and cannot step forward and back - Complete   Is the above level different from baseline mobility prior to current illness? -- -- Yes - Recommend PT order -- --    Row Name 01/24/24 0800 01/23/24 2100 01/23/24 0800 01/22/24 2002     Does patient have an order for bedrest or is patient medically unstable No - Continue assessment No - Continue assessment No - Continue assessment No - Continue assessment    What is the highest level of mobility based on the progressive mobility assessment? Level 3 (Stands with assist) - Balance while standing  and cannot march in place Level 4 (Walks with assist in room) - Balance while marching in place and cannot step forward and back - Complete Level 3 (Stands with assist) - Balance while standing  and cannot march in place Level 3 (Stands with assist) -  Balance while standing  and cannot march in place    Is the above level different from baseline mobility prior to current illness? Yes - Recommend PT order Yes - Recommend PT order Yes - Recommend PT order Yes - Recommend PT order             Barriers to discharge: None Disposition Plan: TBD HH orders placed:  Status is: Inpatient  Objective: Blood pressure (!) 147/66, pulse 64, temperature 98.4 F (36.9 C), temperature source Oral, resp. rate 16, height 5\' 11"  (1.803 m), weight 97 kg, SpO2 98%.  Examination:  Physical Exam Constitutional:      General: He is not in acute distress.    Appearance: Normal appearance.  HENT:     Head: Normocephalic.     Comments: Bruising noted around right eye    Mouth/Throat:     Mouth: Mucous membranes are moist.  Eyes:     Extraocular Movements: Extraocular movements intact.  Cardiovascular:     Rate and Rhythm: Normal rate and regular rhythm.  Pulmonary:     Effort: Pulmonary  effort is normal. No respiratory distress.     Breath sounds: Normal breath sounds. No wheezing.  Abdominal:     General: Bowel sounds are normal. There is no distension.     Palpations: Abdomen is soft.     Tenderness: There is no abdominal tenderness.  Musculoskeletal:        General: Normal range of motion.     Cervical back: Normal range of motion and neck supple.  Skin:    General: Skin is warm and dry.  Neurological:     General: No focal deficit present.     Mental Status: He is alert.  Psychiatric:        Mood and Affect: Mood normal.        Behavior: Behavior normal.      Consultants:  GI ID  Procedures:  4/1: EGD  Data Reviewed: Results for orders placed or performed during the hospital encounter of 01/15/24 (from the past 24 hours)  Glucose, capillary     Status: Abnormal   Collection Time: 01/24/24  9:24 PM  Result Value Ref Range   Glucose-Capillary 150 (H) 70 - 99 mg/dL  CBC with Differential/Platelet     Status: Abnormal   Collection Time: 01/25/24  4:32 AM  Result Value Ref Range   WBC 9.1 4.0 - 10.5 K/uL   RBC 3.53 (L) 4.22 - 5.81 MIL/uL   Hemoglobin 9.5 (L) 13.0 - 17.0 g/dL   HCT 16.1 (L) 09.6 - 04.5 %   MCV 86.7 80.0 - 100.0 fL   MCH 26.9 26.0 - 34.0 pg   MCHC 31.0 30.0 - 36.0 g/dL   RDW 40.9 (H) 81.1 - 91.4 %   Platelets 309 150 - 400 K/uL   nRBC 0.0 0.0 - 0.2 %   Neutrophils Relative % 66 %   Neutro Abs 6.0 1.7 - 7.7 K/uL   Lymphocytes Relative 24 %   Lymphs Abs 2.2 0.7 - 4.0 K/uL   Monocytes Relative 7 %   Monocytes Absolute 0.6 0.1 - 1.0 K/uL   Eosinophils Relative 2 %   Eosinophils Absolute 0.2 0.0 - 0.5 K/uL   Basophils Relative 1 %   Basophils Absolute 0.1 0.0 - 0.1 K/uL   Immature Granulocytes 0 %   Abs Immature Granulocytes 0.03 0.00 - 0.07 K/uL  Comprehensive metabolic panel with GFR     Status: Abnormal   Collection Time:  01/25/24  4:32 AM  Result Value Ref Range   Sodium 132 (L) 135 - 145 mmol/L   Potassium 3.2 (L) 3.5 - 5.1  mmol/L   Chloride 104 98 - 111 mmol/L   CO2 19 (L) 22 - 32 mmol/L   Glucose, Bld 123 (H) 70 - 99 mg/dL   BUN 31 (H) 8 - 23 mg/dL   Creatinine, Ser 1.61 (H) 0.61 - 1.24 mg/dL   Calcium 8.0 (L) 8.9 - 10.3 mg/dL   Total Protein 5.7 (L) 6.5 - 8.1 g/dL   Albumin 2.4 (L) 3.5 - 5.0 g/dL   AST 19 15 - 41 U/L   ALT 15 0 - 44 U/L   Alkaline Phosphatase 49 38 - 126 U/L   Total Bilirubin 0.4 0.0 - 1.2 mg/dL   GFR, Estimated 29 (L) >60 mL/min   Anion gap 9 5 - 15  Magnesium     Status: Abnormal   Collection Time: 01/25/24  4:32 AM  Result Value Ref Range   Magnesium 1.5 (L) 1.7 - 2.4 mg/dL  Phosphorus     Status: None   Collection Time: 01/25/24  4:32 AM  Result Value Ref Range   Phosphorus 2.9 2.5 - 4.6 mg/dL  Glucose, capillary     Status: Abnormal   Collection Time: 01/25/24  7:21 AM  Result Value Ref Range   Glucose-Capillary 125 (H) 70 - 99 mg/dL  Glucose, capillary     Status: Abnormal   Collection Time: 01/25/24 11:35 AM  Result Value Ref Range   Glucose-Capillary 151 (H) 70 - 99 mg/dL  Glucose, capillary     Status: Abnormal   Collection Time: 01/25/24  4:08 PM  Result Value Ref Range   Glucose-Capillary 156 (H) 70 - 99 mg/dL    I have reviewed pertinent nursing notes, vitals, labs, and images as necessary. I have ordered labwork to follow up on as indicated.  I have reviewed the last notes from staff over past 24 hours. I have discussed patient's care plan and test results with nursing staff, CM/SW, and other staff as appropriate.  Time spent: Greater than 50% of the 55 minute visit was spent in counseling/coordination of care for the patient as laid out in the A&P.   LOS: 10 days   Lewie Chamber, MD [facility] 01/25/2024, 5:18 PM

## 2024-01-25 NOTE — Progress Notes (Signed)
 Daily Progress Note   Patient Name: Tommy Green       Date: 01/25/2024 DOB: [birth year]  Age: 78 y.o. MRN#: [id number] Attending Physician: Tommy Chamber, MD Primary Care Physician: Tommy Dory, DO Admit Date: 01/15/2024 Length of Stay: 10 days  Reason for Consultation/Follow-up: Establishing goals of care  HPI/Patient Profile:  78 y.o. male  with past medical history of malignant melanoma, DMII, RA, GERD, hx CVA, HLD who presented with weakness, recurrent falls, and some persistent dark stools. He was admitted on 01/15/2024 with GI bleed liver lesion/liver abscess, iron deficiency anemia, AKI on CKD, nausea/vomiting, PVCs, cognitive deficit, and others.    Palliative medicine was consulted for GOC conversations in the current context of overall decline.  Subjective:   Subjective: Chart Reviewed. Updates received. Patient Assessed. Created space and opportunity for patient  and family to explore thoughts and feelings regarding current medical situation.  Today's Discussion: Today saw the patient at bedside, he is seen sitting in the bedside chair.  His wife was present in the room also.  We celebrated the significant improvement he had since yesterday.  Wife states that turns out he had over a liter of urinary retention they placed a Foley which drained quickly.  He is now much stronger, clearheaded.  They have been told that they will be discharging tomorrow to SNF/rehab at [facility].  Patient is agreeable to this as well.  Confirmed desire for outpatient palliative care to follow along his care trajectory, they previously requested [facility] as they are familiar with them.  TOC order was previously placed.  At this point I explained that goals are clear, plan is clear.  Anticipate discharge in next 24 to 48 hours.  Palliative medicine will back off but we are available for any significant clinical change or new palliative needs.  Family is aware of how to contact us  if we are needed while he is inpatient. I provided emotional and general support through therapeutic listening, empathy, sharing of stories, and other techniques. I answered all questions and addressed all concerns to the best of my ability.  Review of Systems  Constitutional:        Denies pain in general, overall feels much better today  Respiratory:  Negative for shortness of breath.   Gastrointestinal:  Negative for abdominal pain, nausea and vomiting.    Objective:   Vital Signs:  BP (!) 147/66 (BP Location: Left Wrist)   Pulse 64   Temp 98.4 F (36.9 C) (Oral)   Resp 16   Ht 5\' 11"  (1.803 m)   Wt 97 kg   SpO2 98%   BMI 29.82 kg/m   Physical Exam Vitals and nursing note reviewed.  Constitutional:      General: He is not in acute distress.    Appearance: He is obese. He is ill-appearing.  HENT:     Head: Normocephalic and atraumatic.  Cardiovascular:     Rate and Rhythm: Normal rate.  Pulmonary:     Effort: Pulmonary effort is normal. No respiratory distress.     Breath sounds: No wheezing or rhonchi.  Abdominal:     General: Abdomen is flat. There is no distension.     Palpations: Abdomen is soft.     Tenderness: There is no abdominal tenderness.  Skin:    General: Skin is warm and dry.  Neurological:     General: No focal deficit present.     Mental Status: He is alert and oriented  to person, place, and time.  Psychiatric:        Mood and Affect: Mood normal.        Behavior: Behavior normal.     Palliative Assessment/Data: 60%    Existing Vynca/ACP Documentation: Advance directive signed 08/11/2023   Assessment & Plan:   Impression: Present on Admission:  GI bleed  Essential hypertension  Hyperlipidemia associated with type 2 diabetes mellitus (HCC)  Type 2 diabetes mellitus with diabetic neuropathy, unspecified (HCC)  BPH associated with nocturia  AKI (acute kidney injury) (HCC)  78 year old male with acute presentation chronic comorbidities  as described above.  The patient is not critically ill at this time, seems to be improving from his acute illness perspective.  However, there has been an overall decline in his after discharge.  Wife is clear about he has very preset limits as far as no artificial nutrition, no hemodialysis, etc.  She understands that these may be scary conversations in the future and is agreeable to outpatient palliative care.  She is hoping that he can get placed at [facility] for short-term rehab.  She understands that depending on how he does he may need long-term care and she is exploring options on how to do this.  Overall prognosis guarded to poor, especially in the long-term.  SUMMARY OF RECOMMENDATIONS   DNR-limited Continue full scope of care otherwise Anticipate d/c to SNF/Rehab in the next 24-48 hours Goals are clear TOC consult for outpatient palliative care with [facility] previously placed Palliative medicine will back off for now Please notify us of any new palliative needs or significant clinical change  Symptom Management:  Per primary team PMT is available to assist as needed  Code Status: DNR-limited  Prognosis: > 12 months  Discharge Planning: Skilled Nursing Facility for rehab with Palliative care service follow-up  Discussed with: Patient, family, medical team, nursing team, [facility] team  Thank you for allowing [place] to participate in the care of Tommy Green PMT will continue to support holistically.  Time Total: 25 min  Detailed review of medical records (labs, imaging, vital signs), medically appropriate exam, discussed with treatment team, counseling and education to patient, family, & staff, documenting clinical information, medication management, coordination of care  Tommy Dust, NP Palliative Medicine Team  Team Phone # [phone] (Nights/Weekends)  06/16/2021, 8:17 AM

## 2024-01-25 NOTE — Progress Notes (Addendum)
 [facility] Infectious Diseases Follow Up Note  Patient Identification: Patient Name: Tommy Green MRN: [id number] Admit Date: 01/15/2024  9:11 AM Age: 78 y.o.Today's Date: 01/25/2024   Reason for Visit: Liver abscess  Principal Problem:   GI bleed Active Problems:   Essential hypertension   Hyperlipidemia associated with type 2 diabetes mellitus (HCC)   Type 2 diabetes mellitus with diabetic neuropathy, unspecified (HCC)   AKI (acute kidney injury) (HCC)   S/P TAVR (transcatheter aortic valve replacement)   BPH associated with nocturia   Liver mass   Rheumatoid arteritis (HCC)   Heme positive stool   Abnormal CT of liver   Acute on chronic anemia   Lung nodule   Bacterial liver abscess   Urinary tract infection with hematuria   Antibiotics: Ceftriaxone 3/30- Metronidazole 4/1-  Lines/Hardwares: TAVR  Interval Events:   Assessment 42 Y O male with multiple co-morbidities as below including DM2, RA initially presented with weakness, recurrent falls and dark stools. Found to have hb 6 requiring transfusions, including liver lesions consistent with abscess.   # Liver abscess  4/2 s/p US aspiration and cor biopsy - path consistent with reactive fibrosis and inflammation c/a abscess. Cx with E coli R to Unasyn  # Anemia/GIB - s/p multiple transfusions  - 4/1 s/p EGD posterior oropharynx abnormal with congested mucosa and hematin, grade 1 esophageal varices noted proximally and in the middle of the esophagus.  No gross lesions in the distal esophagus.  Low-grade narrowing Schatzki's ring.  5 cm hiatal hernia.  Erythematous mucosa in the stomach, biopsied.  No evidence of portal gastropathy or varices of the stomach. Path suggestive of iron pill gastritis, chronic peptic duodenitis  # AKI on CKD - in the setting of urinary retention - UA with small leukocytes and rare bacteria, unimpressive for UTI - CT 4/8 with  overdistended bladder with b/l hydronephrosis, left peripnephric stranding with involvement of proximal ureter  concerning for UTI - on foley's  # Cognitive deficit with possible delirium  - follows basic commands appropriately this am  # DVT  - on [facility]  Recommendations - continue ceftriaxone and PO metronidazole IP and switch to  ciprofloxacin and metronidazole on discharge. qtc 463. 30 days supply with 1 refill - Monitor CBC and CMP on abtx - Universal /standard isolation precautions.  - Patient has a fu appointment on 4/22 at 10: 15 am. ID will so, recall back with questions or concerns.  - d/w primary  team  Rest of the management as per the primary team. Thank you for the consult. Please page with pertinent questions or concerns.  ______________________________________________________________________ Subjective patient seen and examined at the bedside. He wants to have a Bowel movement,  Past Medical History:  Diagnosis Date   Anemia    Essential hypertension 07/18/2017   Foot drop, left foot    due to back surgery in 01/2021   GERD (gastroesophageal reflux disease)    History of colonic polyps    History of hiatal hernia    many years ago   History of rheumatic fever    History of stroke 07/18/2017   Hypertriglyceridemia 07/18/2017   S/P TAVR (transcatheter aortic valve replacement) 01/26/2022   [name] 29mm via TF approach with Dr. Lynnette Caffey and Dr. Laneta Simmers   Severe aortic stenosis    Type 2 diabetes mellitus with diabetic neuropathy, unspecified (HCC) 07/18/2017   Past Surgical History:  Procedure Laterality Date   COLONOSCOPY     around 2018 [facility]  GI   COLONOSCOPY  09/02/2020   CRANIOTOMY Left 10/25/2019   Procedure: CRANIOTOMY HEMATOMA EVACUATION SUBDURAL;  Surgeon: Bedelia Person, MD;  Location: [facility] OR;  Service: Neurosurgery;  Laterality: Left;   ESOPHAGOGASTRODUODENOSCOPY     around 2018 with [facility]   ESOPHAGOGASTRODUODENOSCOPY N/A  01/17/2024   Procedure: EGD (ESOPHAGOGASTRODUODENOSCOPY);  Surgeon: Lemar Lofty., MD;  Location: [facility];  Service: Gastroenterology;  Laterality: N/A;   FOOT CAPSULOTOMY Left 07/25/2008   Mid Foot #2 MPJ   Hammertoe Repair Left 07/25/2008   #2 toe   HAND SURGERY     nodule removed from left thumb and right index finger, Dr. Judithann Sheen   INTRAOPERATIVE TRANSTHORACIC ECHOCARDIOGRAM N/A 01/26/2022   Procedure: INTRAOPERATIVE TRANSTHORACIC ECHOCARDIOGRAM;  Surgeon: Orbie Pyo, MD;  Location: [facility] OR;  Service: Open Heart Surgery;  Laterality: N/A;   MOHS SURGERY  01/25/2023   right arm   RIGHT/LEFT HEART CATH AND CORONARY ANGIOGRAPHY N/A 12/11/2021   Procedure: RIGHT/LEFT HEART CATH AND CORONARY ANGIOGRAPHY;  Surgeon: Corky Crafts, MD;  Location: [facility] INVASIVE CV LAB;  Service: Cardiovascular;  Laterality: N/A;   SPINAL FUSION  01/2021   TARSAL TUNNEL RELEASE Left 07/25/2008   TONSILLECTOMY     removed as a child   TRANSCATHETER AORTIC VALVE REPLACEMENT, TRANSFEMORAL N/A 01/26/2022   Procedure: Transcatheter Aortic Valve Replacement , Transfemoral;  Surgeon: Orbie Pyo, MD;  Location: [facility] OR;  Service: Open Heart Surgery;  Laterality: N/A;  Percutaneous   UPPER GASTROINTESTINAL ENDOSCOPY  09/02/2020    Vitals BP (!) 177/64 (BP Location: Right Arm)   Pulse 73   Temp 97.6 F (36.4 C) (Oral)   Resp 17   Ht 5\' 11"  (1.803 m)   Wt 97 kg   SpO2 98%   BMI 29.82 kg/m    Physical Exam Constitutional:  elderly male lying in the bed, non toxic appearing    Comments: HEENT WNL  Cardiovascular:     Rate and Rhythm: Normal rate and regular rhythm.     Heart sounds: s1s2  Pulmonary:     Effort: Pulmonary effort is normal.     Comments: Normal breath sounds  Abdominal:     Palpations: Abdomen is soft.     Tenderness: Nontender and nondistended  Musculoskeletal:        General: No swelling or tenderness in peripheral joints  Skin:    Comments: No  rashes  Neurological:     General: Able to tell he is in hospital at [facility], month is April but year is 2027, follows simple basic commands  Psychiatric:        Mood and Affect: Mood normal.     Pertinent Microbiology Results for orders placed or performed during the hospital encounter of 01/15/24  Urine Culture     Status: Abnormal   Collection Time: 01/15/24  9:31 AM   Specimen: Urine, Random  Result Value Ref Range Status   Specimen Description   Final    URINE, RANDOM Performed at [facility], [address] [place], [place] [address]    Special Requests   Final    NONE Reflexed from [id number] Performed at [facility], [address] [place], [place] [address]    Culture >=100,000 COLONIES/mL ESCHERICHIA COLI (A)  Final   Report Status 01/17/2024 FINAL  Final   Organism ID, Bacteria ESCHERICHIA COLI (A)  Final      Susceptibility   Escherichia coli - MIC*  AMPICILLIN >=32 RESISTANT Resistant     CEFAZOLIN >=64 RESISTANT Resistant     CEFEPIME <=0.12 SENSITIVE Sensitive     CEFTRIAXONE <=0.25 SENSITIVE Sensitive     CIPROFLOXACIN <=0.25 SENSITIVE Sensitive     GENTAMICIN <=1 SENSITIVE Sensitive     IMIPENEM <=0.25 SENSITIVE Sensitive     NITROFURANTOIN <=16 SENSITIVE Sensitive     TRIMETH/SULFA <=20 SENSITIVE Sensitive     AMPICILLIN/SULBACTAM >=32 RESISTANT Resistant     PIP/TAZO 16 SENSITIVE Sensitive ug/mL    * >=100,000 COLONIES/mL ESCHERICHIA COLI  Blood culture (routine x 2)     Status: None   Collection Time: 01/15/24 10:05 AM   Specimen: BLOOD LEFT ARM  Result Value Ref Range Status   Specimen Description   Final    BLOOD LEFT ARM Performed at [facility], [address] [place], [place] [address]    Special Requests   Final    BOTTLES DRAWN AEROBIC AND ANAEROBIC Blood Culture adequate volume Performed at [facility], [address] [place], [place] [address]    Culture   Final    NO GROWTH 5  DAYS Performed at [facility], [address] [place], [place] [address]    Report Status 01/20/2024 FINAL  Final  Blood culture (routine x 2)     Status: None   Collection Time: 01/15/24 10:05 AM   Specimen: BLOOD RIGHT ARM  Result Value Ref Range Status   Specimen Description   Final    BLOOD RIGHT ARM Performed at [facility], [address] [place], [place] [address]    Special Requests   Final    BOTTLES DRAWN AEROBIC AND ANAEROBIC Blood Culture adequate volume Performed at [facility], [address] [place], [place] [address]    Culture   Final    NO GROWTH 5 DAYS Performed at [facility], [address], [place], [place] [address]    Report Status 01/20/2024 FINAL  Final  Aerobic/Anaerobic Culture w Gram Stain (surgical/deep wound)     Status: None   Collection Time: 01/18/24  3:07 PM   Specimen: Abscess  Result Value Ref Range Status   Specimen Description   Final    ABSCESS Performed at [facility], [address] [place], [place] [address]    Special Requests   Final    NONE Performed at [facility], [address] [place], [place] [address]    Gram Stain   Final    MODERATE WBC PRESENT, PREDOMINANTLY PMN RARE GRAM NEGATIVE RODS    Culture   Final    MODERATE ESCHERICHIA COLI NO ANAEROBES ISOLATED Performed at [facility], [address], [place], [place] [address]    Report Status 01/23/2024 FINAL  Final   Organism ID, Bacteria ESCHERICHIA COLI  Final   Organism ID, Bacteria ESCHERICHIA COLI  Final      Susceptibility   Escherichia coli - MIC*    AMPICILLIN >=32 RESISTANT Resistant     CEFEPIME <=0.12 SENSITIVE Sensitive     CEFTAZIDIME <=1 SENSITIVE Sensitive     CEFTRIAXONE <=0.25 SENSITIVE Sensitive     CIPROFLOXACIN <=0.25 SENSITIVE Sensitive     GENTAMICIN <=1 SENSITIVE Sensitive     IMIPENEM <=0.25 SENSITIVE Sensitive     TRIMETH/SULFA <=20 SENSITIVE  Sensitive     AMPICILLIN/SULBACTAM >=32 RESISTANT Resistant     PIP/TAZO 16 SENSITIVE Sensitive ug/mL  Escherichia coli - KIRBY BAUER*    CEFAZOLIN RESISTANT Resistant     * MODERATE ESCHERICHIA COLI    MODERATE ESCHERICHIA COLI   Pertinent Lab.    Latest Ref Rng & Units 01/25/2024    4:32 AM 01/24/2024    4:53 AM 01/23/2024    4:43 AM  CBC  WBC 4.0 - 10.5 K/uL 9.1  12.2  11.5   Hemoglobin 13.0 - 17.0 g/dL 9.5  8.9  9.8   Hematocrit 39.0 - 52.0 % 30.6  28.4  30.0   Platelets 150 - 400 K/uL 309  316  316    ,    Latest Ref Rng & Units 01/25/2024    4:32 AM 01/24/2024    4:53 AM 01/23/2024    4:43 AM  CMP  Glucose 70 - 99 mg/dL 782  956  213   BUN 8 - 23 mg/dL 31  39  41   Creatinine 0.61 - 1.24 mg/dL 0.86  5.78  4.69   Sodium 135 - 145 mmol/L 132  133  136   Potassium 3.5 - 5.1 mmol/L 3.2  3.2  3.4   Chloride 98 - 111 mmol/L 104  107  108   CO2 22 - 32 mmol/L 19  17  17    Calcium 8.9 - 10.3 mg/dL 8.0  8.1  8.3   Total Protein 6.5 - 8.1 g/dL 5.7  5.9  6.1   Total Bilirubin 0.0 - 1.2 mg/dL 0.4  0.5  0.5   Alkaline Phos 38 - 126 U/L 49  47  47   AST 15 - 41 U/L 19  18  18    ALT 0 - 44 U/L 15  14  14       Pertinent Imaging today Plain films and CT images have been personally visualized and interpreted; radiology reports have been reviewed. Decision making incorporated into the Impression /   CT RENAL STONE STUDY Result Date: 01/24/2024 CLINICAL DATA:  Acute renal injury and urinary frequency EXAM: CT ABDOMEN AND PELVIS WITHOUT CONTRAST TECHNIQUE: Multidetector CT imaging of the abdomen and pelvis was performed following the standard protocol without IV contrast. RADIATION DOSE REDUCTION: This exam was performed according to the departmental dose-optimization program which includes automated exposure control, adjustment of the mA and/or kV according to patient size and/or use of iterative reconstruction technique. COMPARISON:  01/15/2024 FINDINGS: Lower chest: Minimal pleural effusions  are noted bilaterally. No focal infiltrate is seen. Hepatobiliary: Gallbladder has been surgically removed. Persistent hypodense mass lesion is noted within the right hepatic lobe overall stable in appearance from the prior exam. Pancreas: Unremarkable. No pancreatic ductal dilatation or surrounding inflammatory changes. Spleen: Normal in size without focal abnormality. Adrenals/Urinary Tract: Adrenal glands are within normal limits. Kidneys are well visualized bilaterally with stable simple cyst in the upper pole of the left kidney. No further follow-up is recommended. No renal calculi are seen. Fullness of the collecting systems is noted bilaterally likely related to over distention of the bladder as no calculi are seen. Increase in perinephric stranding on the left is noted which could be related to an underlying UTI. Correlation with laboratory values is recommended. Stomach/Bowel: Scattered diverticular change of the colon is noted. No evidence of diverticulitis is seen. The appendix is well visualized and within normal limits. Small bowel and stomach show no evidence of small sliding-type hiatal hernia. Vascular/Lymphatic: Aortic atherosclerosis. No enlarged abdominal or pelvic lymph nodes. Reproductive: Prostate is unremarkable. Other: No abdominal wall hernia or abnormality. No abdominopelvic  ascites. Musculoskeletal: Postsurgical changes and degenerative changes are noted in the thoracolumbar spine. IMPRESSION: Over distended bladder with associated hydronephrosis bilaterally. Increased perinephric stranding on the left with some involvement of the proximal ureter suspicious for underlying UTI. Stable hypodense mass lesion within the liver as previously seen on CT and MRI. Electronically Signed   By: Alcide Clever M.D.   On: 01/24/2024 10:49   DG Abd 1 View Result Date: 01/20/2024 CLINICAL DATA:  Nausea and vomiting. EXAM: ABDOMEN - 1 VIEW COMPARISON:  Pelvic radiograph dated 03/28/2021. FINDINGS: No bowel  dilatation or evidence of obstruction. Evaluation however is limited due to body habitus. Right upper quadrant cholecystectomy clips. Extensive spinal fusion hardware. No acute osseous pathology. IMPRESSION: Nonobstructive bowel gas pattern. Electronically Signed   By: Elgie Collard M.D.   On: 01/20/2024 18:02   ECHOCARDIOGRAM COMPLETE Result Date: 01/20/2024    ECHOCARDIOGRAM REPORT   Patient Name:   CAYMAN KIELBASA Date of Exam: 01/20/2024 Medical Rec #:  [id number]         Height:       71.0 in Accession #:    [id number]        Weight:       213.8 lb Date of Birth:  05/20/1946          BSA:          2.169 m Patient Age:    78 years          BP:           180/85 mmHg Patient Gender: M                 HR:           70 bpm. Exam Location:  Inpatient Procedure: 2D Echo, 3D Echo, Cardiac Doppler, Color Doppler and Strain Analysis            (Both Spectral and Color Flow Doppler were utilized during            procedure). Indications:    S/P AVR  History:        Patient has prior history of Echocardiogram examinations, most                 recent 01/26/2023. Risk Factors:Hypertension, Diabetes,                 Dyslipidemia and Former Smoker.                 Aortic Valve: 29 mm Sapien prosthetic, stented (TAVR) valve is                 present in the aortic position.  Sonographer:    Karma Ganja Referring Phys: [phone] A CALDWELL POWELL JR  Sonographer Comments: Global longitudinal strain was attempted. IMPRESSIONS  1. Left ventricular ejection fraction, by estimation, is 55 to 60%. Left ventricular ejection fraction by 3D volume is 54 %. Left ventricular ejection fraction by 2D MOD biplane is 55.3 %. The left ventricle has normal function. The left ventricle has no regional wall motion abnormalities. There is mild concentric left ventricular hypertrophy. Left ventricular diastolic parameters are consistent with Grade I diastolic dysfunction (impaired relaxation). The average left ventricular global longitudinal strain  is -13.2 %. The global longitudinal strain is abnormal.  2. Peak RV-RA gradient 23 mmHg. Right ventricular systolic function is normal. The right ventricular size is normal.  3. Left atrial size was mild to moderately dilated.  4. The mitral valve is degenerative.  Trivial mitral valve regurgitation. Mild mitral stenosis. The mean mitral valve gradient is 6.0 mmHg, but calculated MVA by VTI is 2.97 cm^2. Moderate mitral annular calcification.  5. Bioprosthetic aortic valve s/p TAVR with 29 mm Edwards Sapien THV. Mean gradient 10 mmHg, EOA 2.73 cm^2. Dimensionless index 43. Trivial perivalvular leakage.  6. The IVC was not visualized. FINDINGS  Left Ventricle: Left ventricular ejection fraction, by estimation, is 55 to 60%. Left ventricular ejection fraction by 2D MOD biplane is 55.3 %. Left ventricular ejection fraction by 3D volume is 54 %. The left ventricle has normal function. The left ventricle has no regional wall motion abnormalities. The average left ventricular global longitudinal strain is -13.2 %. Strain was performed and the global longitudinal strain is abnormal. The left ventricular internal cavity size was normal in size. There is mild concentric left ventricular hypertrophy. Left ventricular diastolic parameters are consistent with Grade I diastolic dysfunction (impaired relaxation). Right Ventricle: Peak RV-RA gradient 23 mmHg. The right ventricular size is normal. No increase in right ventricular wall thickness. Right ventricular systolic function is normal. Left Atrium: Left atrial size was mild to moderately dilated. Right Atrium: Right atrial size was normal in size. Pericardium: There is no evidence of pericardial effusion. Mitral Valve: The mitral valve is degenerative in appearance. There is moderate calcification of the mitral valve leaflet(s). Moderate mitral annular calcification. Trivial mitral valve regurgitation. Mild mitral valve stenosis. MV peak gradient, 15.1 mmHg. The mean mitral  valve gradient is 6.0 mmHg. Tricuspid Valve: The tricuspid valve is normal in structure. Tricuspid valve regurgitation is trivial. Aortic Valve: Bioprosthetic aortic valve s/p TAVR with 29 mm Edwards Sapien THV. Mean gradient 10 mmHg, EOA 2.73 cm^2. Dimensionless index 43. Trivial perivalvular leakage. The aortic valve has been repaired/replaced. Aortic valve regurgitation is trivial. Aortic valve mean gradient measures 10.0 mmHg. Aortic valve peak gradient measures 16.6 mmHg. Aortic valve area, by VTI measures 2.73 cm. There is a 29 mm Sapien prosthetic, stented (TAVR) valve present in the aortic position. Pulmonic Valve: The pulmonic valve was normal in structure. Pulmonic valve regurgitation is trivial. Aorta: The aortic root is normal in size and structure. Venous: The IVC was not visualized. The inferior vena cava was not well visualized. IAS/Shunts: No atrial level shunt detected by color flow Doppler. Additional Comments: 3D was performed not requiring image post processing on an independent workstation and was normal.  LEFT VENTRICLE PLAX 2D                        Biplane EF (MOD) LVIDd:         4.70 cm         LV Biplane EF:   Left LVIDs:         3.90 cm                          ventricular LV PW:         1.20 cm                          ejection LV IVS:        1.50 cm                          fraction by LVOT diam:     2.65 cm  2D MOD LV SV:         118                              biplane is LV SV Index:   54                               55.3 %. LVOT Area:     5.52 cm                                Diastology                                LV e' medial:    5.11 cm/s LV Volumes (MOD)               LV E/e' medial:  22.7 LV vol d, MOD    164.0 ml      LV e' lateral:   5.00 cm/s A2C:                           LV E/e' lateral: 23.2 LV vol d, MOD    181.0 ml A4C:                           2D Longitudinal LV vol s, MOD    75.6 ml       Strain A2C:                           2D Strain  GLS   -13.2 % LV vol s, MOD    77.2 ml       Avg: A4C: LV SV MOD A2C:   88.4 ml       3D Volume EF LV SV MOD A4C:   181.0 ml      LV 3D EF:    Left LV SV MOD BP:    95.6 ml                    ventricul                                             ar                                             ejection                                             fraction                                             by 3D  volume is                                             54 %.                                 3D Volume EF:                                3D EF:        54 %                                LV EDV:       188 ml                                LV ESV:       86 ml                                LV SV:        102 ml RIGHT VENTRICLE RV Basal diam:  4.00 cm RV S prime:     14.30 cm/s TAPSE (M-mode): 3.0 cm LEFT ATRIUM              Index        RIGHT ATRIUM           Index LA diam:        4.90 cm  2.26 cm/m   RA Area:     18.10 cm LA Vol (A2C):   109.0 ml 50.25 ml/m  RA Volume:   43.90 ml  20.24 ml/m LA Vol (A4C):   65.7 ml  30.29 ml/m LA Biplane Vol: 87.4 ml  40.29 ml/m  AORTIC VALVE AV Area (Vmax):    2.54 cm AV Area (Vmean):   2.37 cm AV Area (VTI):     2.73 cm AV Vmax:           204.00 cm/s AV Vmean:          145.000 cm/s AV VTI:            0.433 m AV Peak Grad:      16.6 mmHg AV Mean Grad:      10.0 mmHg LVOT Vmax:         94.10 cm/s LVOT Vmean:        62.400 cm/s LVOT VTI:          0.214 m LVOT/AV VTI ratio: 0.49 MITRAL VALVE                TRICUSPID VALVE MV Area (PHT): 4.41 cm     TR Peak grad:   22.7 mmHg MV Area VTI:   2.97 cm     TR Vmax:        238.00 cm/s MV Peak grad:  15.1 mmHg MV Mean grad:  6.0 mmHg     SHUNTS MV Vmax:       1.94 m/s     Systemic VTI:  0.21 m MV Vmean:      121.0 cm/s  Systemic Diam: 2.65 cm MV Decel Time: 172 msec MV E velocity: 116.00 cm/s MV A velocity: 177.00 cm/s MV E/A ratio:  0.66 Dalton McleanMD Electronically signed by Wilfred Lacy Signature Date/Time: 01/20/2024/3:30:56 PM    Final    VAS [place] UPPER EXTREMITY VENOUS DUPLEX Result Date: 01/20/2024 UPPER VENOUS STUDY  Patient Name:  BENUEL LY  Date of Exam:   01/19/2024 Medical Rec #: [id number]          Accession #:    [id number] Date of Birth: 02-14-1946           Patient Gender: M Patient Age:   11 years Exam Location:  [facility] Procedure:      VAS [place] UPPER EXTREMITY VENOUS DUPLEX Referring Phys: A POWELL JR --------------------------------------------------------------------------------  Indications: Edema, and Recent fall/injury. Other Indications: H/O CVA. Comparison Study: No prior exam. Performing Technologist: Fernande Bras  Examination Guidelines: A complete evaluation includes B-mode imaging, spectral Doppler, color Doppler, and power Doppler as needed of all accessible portions of each vessel. Bilateral testing is considered an integral part of a complete examination. Limited examinations for reoccurring indications may be performed as noted.  Right Findings: +----------+------------+---------+-----------+----------+-------+ RIGHT     CompressiblePhasicitySpontaneousPropertiesSummary +----------+------------+---------+-----------+----------+-------+ IJV           Full       Yes       Yes                      +----------+------------+---------+-----------+----------+-------+ Subclavian    Full       Yes       Yes                      +----------+------------+---------+-----------+----------+-------+ Axillary      Full       Yes       Yes                      +----------+------------+---------+-----------+----------+-------+ Brachial      Full       Yes       Yes                      +----------+------------+---------+-----------+----------+-------+ Radial      Partial      Yes       Yes                      +----------+------------+---------+-----------+----------+-------+ Ulnar         Full                                           +----------+------------+---------+-----------+----------+-------+ Cephalic    Partial      No        No                       +----------+------------+---------+-----------+----------+-------+ Basilic     Partial      Yes       Yes               At [facility]  +----------+------------+---------+-----------+----------+-------+ Thrombus noted in the basilic vein at the antecubital fossa, in the proximal segment of one of the paired radial veins, and in the cephalic vein in the mid forearm at the IV placement. Cystic structure noted in the  right shoulder measuring 4.1 x 0.94 x 1.7 cm  Left Findings: +----------+------------+---------+-----------+----------+-------+ LEFT      CompressiblePhasicitySpontaneousPropertiesSummary +----------+------------+---------+-----------+----------+-------+ IJV           Full       Yes       Yes                      +----------+------------+---------+-----------+----------+-------+ Subclavian    Full       Yes       Yes                      +----------+------------+---------+-----------+----------+-------+ Axillary      Full       Yes       Yes                      +----------+------------+---------+-----------+----------+-------+ Brachial      Full       Yes       Yes                      +----------+------------+---------+-----------+----------+-------+ Radial        Full                                          +----------+------------+---------+-----------+----------+-------+ Ulnar         Full                                          +----------+------------+---------+-----------+----------+-------+ Cephalic      Full       Yes       Yes                      +----------+------------+---------+-----------+----------+-------+ Basilic       Full       Yes       Yes                      +----------+------------+---------+-----------+----------+-------+  Summary:  Right: Findings consistent  with acute deep vein thrombosis involving the right radial veins. Findings consistent with acute superficial vein thrombosis involving the right basilic vein and right cephalic vein.  Left: No evidence of deep vein thrombosis in the upper extremity. No evidence of superficial vein thrombosis in the upper extremity.  *See table(s) above for measurements and observations.  Diagnosing physician: Heath Lark Electronically signed by Heath Lark on 01/20/2024 at 1:10:44 PM.    Final    US BIOPSY (LIVER) Result Date: 01/18/2024 CLINICAL DATA:  Enlarging septated cystic appearing right hepatic lobe lesion. EXAM: ULTRASOUND-GUIDED CORE AND ASPIRATION LIVER BIOPSY TECHNIQUE: An ultrasound guided liver biopsy with possible drainage was thoroughly discussed with the patient and questions were answered. The benefits, risks, alternatives, and complications were also discussed. The patient understands and wishes to proceed with the procedure. A verbal as well as written consent was obtained. Survey ultrasound of the liver was performed, lesion localized, and an appropriate skin entry site was determined. Skin site was marked, prepped with chlorhexidine, and draped in usual sterile fashion, and infiltrated locally with 1% lidocaine. Intravenous Fentanyl and Versed 1mg  were administered by RN during a total moderate (conscious) sedation time of 10 minutes; the patient's  level of consciousness and physiological / cardiorespiratory status were monitored continuously by radiology RN under my direct supervision. An 18 gauge spinal needle was advanced into the cystic appearing component. No fluid could be aspirated. The needle was removed. Subsequently, a 17 gauge trocar needle was advanced under ultrasound guidance to the margin of the lesion. Multiple solid-appearing. coaxial 18gauge core samples were then obtained through the guide needle. Subsequently, a less than 2 mL viscous opaque fluid were could be aspirated through  the guide needle, which was then removed. Post procedure scans demonstrate no apparent complication. COMPLICATIONS: COMPLICATIONS None immediate FINDINGS: Septated right hepatic lobe lesion was localized corresponding to recent MR. No fluid could be aspirated, so drain catheter placement was not attempted. Ultrasound-guided 18 gauge core biopsy samples of the lesion were obtained, sent for surgical pathology analysis. Ultrasound-guided 17 gauge aspiration viscous fluid sample from the lesion was obtained, sent for microbiology analysis. IMPRESSION: 1. Technically successful ultrasound guided core and aspiration liver lesion biopsy. Electronically Signed   By: Corlis Leak M.D.   On: 01/18/2024 18:52   [place] FNA SOFT TISSUE Result Date: 01/18/2024 CLINICAL DATA:  Enlarging septated cystic appearing right hepatic lobe lesion. EXAM: ULTRASOUND-GUIDED CORE AND ASPIRATION LIVER BIOPSY TECHNIQUE: An ultrasound guided liver biopsy with possible drainage was thoroughly discussed with the patient and questions were answered. The benefits, risks, alternatives, and complications were also discussed. The patient understands and wishes to proceed with the procedure. A verbal as well as written consent was obtained. Survey ultrasound of the liver was performed, lesion localized, and an appropriate skin entry site was determined. Skin site was marked, prepped with chlorhexidine, and draped in usual sterile fashion, and infiltrated locally with 1% lidocaine. Intravenous Fentanyl and Versed 1mg  were administered by RN during a total moderate (conscious) sedation time of 10 minutes; the patient's level of consciousness and physiological / cardiorespiratory status were monitored continuously by radiology RN under my direct supervision. An 18 gauge spinal needle was advanced into the cystic appearing component. No fluid could be aspirated. The needle was removed. Subsequently, a 17 gauge trocar needle was advanced under ultrasound  guidance to the margin of the lesion. Multiple solid-appearing. coaxial 18gauge core samples were then obtained through the guide needle. Subsequently, a less than 2 mL viscous opaque fluid were could be aspirated through the guide needle, which was then removed. Post procedure scans demonstrate no apparent complication. COMPLICATIONS: COMPLICATIONS None immediate FINDINGS: Septated right hepatic lobe lesion was localized corresponding to recent MR. No fluid could be aspirated, so drain catheter placement was not attempted. Ultrasound-guided 18 gauge core biopsy samples of the lesion were obtained, sent for surgical pathology analysis. Ultrasound-guided 17 gauge aspiration viscous fluid sample from the lesion was obtained, sent for microbiology analysis. IMPRESSION: 1. Technically successful ultrasound guided core and aspiration liver lesion biopsy. Electronically Signed   By: Corlis Leak M.D.   On: 01/18/2024 18:52   MR LIVER W WO CONTRAST Result Date: 01/16/2024 CLINICAL DATA:  Liver lesion, malignancy suspected EXAM: MRI ABDOMEN WITHOUT AND WITH CONTRAST TECHNIQUE: Multiplanar multisequence MR imaging of the abdomen was performed both before and after the administration of intravenous contrast. CONTRAST:  10mL GADAVIST GADOBUTROL 1 MMOL/ML IV SOLN COMPARISON:  CT chest abdomen pelvis, 01/15/2024 FINDINGS: Lower chest: No acute abnormality.  Small hiatal hernia. Hepatobiliary: Multilobulated, internally septated rim enhancing lesion within the inferior right lobe of the liver, hepatic segment V/VI measuring 4.5 x 3.7 cm (series 11, image 46). Second small component which  is inseparable from this larger lesion and the liver capsule situated just inferiorly measuring 2.5 x 2.4 cm (series 11, image 63). These lesions demonstrate internal diffusion restriction. Additional small enhancing lesion in the anterior right lobe of the liver, hepatic segment VIII measuring 1.2 x 1.0 cm without internal hypoenhancement or  fluid character (series 11, image 24). Status post cholecystectomy. No biliary ductal dilatation. Pancreas: Unremarkable. No pancreatic ductal dilatation or surrounding inflammatory changes. Spleen: Normal in size without significant abnormality. Adrenals/Urinary Tract: Adrenal glands are unremarkable. Layering hemorrhagic or proteinaceous cyst arising from the posterior superior pole of the left kidney benign, requiring no further follow-up or characterization (series 11, image 50). Kidneys are otherwise normal, without obvious renal calculi, solid lesion, or hydronephrosis. Stomach/Bowel: Stomach is within normal limits. No evidence of bowel wall thickening, distention, or inflammatory changes. Vascular/Lymphatic: No significant vascular findings are present. No enlarged abdominal lymph nodes. Other: No abdominal wall hernia or abnormality. No ascites. Musculoskeletal: No acute or significant osseous findings. IMPRESSION: 1. Multilobulated, internally septated rim enhancing lesion within the inferior right lobe of the liver, hepatic segment V/VI measuring 4.5 x 3.7 cm. Second small component which is inseparable from this larger lesion and the liver capsule situated just inferiorly measuring 2.5 x 2.4 cm. Imaging characteristics are strongly suggestive of hepatic abscess, however a cystic metastasis could also have this appearance depending upon clinical presentation. 2. Additional small enhancing lesion in the anterior right lobe of the liver, hepatic segment VIII measuring 1.2 x 1.0 cm without internal hypoenhancement or fluid character. This is less specific, and could reflect a small phlegmon or additional metastasis. 3. No lymphadenopathy or other evidence of metastatic disease in the abdomen. Electronically Signed   By: Jearld Lesch M.D.   On: 01/16/2024 22:38   CT CHEST ABDOMEN PELVIS WO CONTRAST Result Date: 01/15/2024 CLINICAL DATA:  Abdominal trauma, blunt. Multiple falls in last 2 weeks. Worsening  urinary frequency. Dysuria. * Tracking Code: BO * EXAM: CT CHEST, ABDOMEN AND PELVIS WITHOUT CONTRAST TECHNIQUE: Multidetector CT imaging of the chest, abdomen and pelvis was performed following the standard protocol without IV contrast. RADIATION DOSE REDUCTION: This exam was performed according to the departmental dose-optimization program which includes automated exposure control, adjustment of the mA and/or kV according to patient size and/or use of iterative reconstruction technique. COMPARISON:  CT scan chest, abdomen and pelvis from 12/18/2021 and CT scan abdomen and pelvis from 08/21/2023 FINDINGS: CT CHEST FINDINGS Cardiovascular: Normal cardiac size. No pericardial effusion. No aortic aneurysm. There are coronary artery calcifications, in keeping with coronary artery disease. Prosthetic aortic valve noted. There is dense mitral annulus calcification. Mediastinum/Nodes: Visualized thyroid gland appears grossly unremarkable. No solid / cystic mediastinal masses. The esophagus is nondistended precluding optimal assessment. No mediastinal or axillary lymphadenopathy by size criteria. Evaluation of bilateral hila is limited due to lack on intravenous contrast: however, no large hilar lymphadenopathy identified. Lungs/Pleura: The central tracheo-bronchial tree is patent. There are patchy areas of linear, plate-like atelectasis and/or scarring throughout bilateral lungs. No mass or consolidation. No pleural effusion or pneumothorax. There is a subpleural solid noncalcified 6 x 7 mm nodule in the left lung lower lobe (series 312, image 58), which is grossly unchanged since the prior study from 08/21/2023 but new since the prior study from 12/18/2021. There is a stable 2 mm calcified granuloma in the left lung lower lobe. Musculoskeletal: The visualized soft tissues of the chest wall are grossly unremarkable. No suspicious osseous lesions. There are mild multilevel degenerative  changes in the visualized spine. CT  ABDOMEN PELVIS FINDINGS Hepatobiliary: The liver is normal in size. Non-cirrhotic configuration. There is a new ill-defined heterogeneous hypoattenuating approximately 4.2 x 4.8 cm mass in the right hepatic lobe, segment 5/6, which is new since the prior study and highly concerning for neoplastic process. There is an additional smaller approximately 1 cm sized hypoattenuating structure in the right hepatic lobe, segment 8, which is also concerning for neoplastic process. Further evaluation with multiphasic contrast-enhanced MRI or CT scan abdomen as per liver mass protocol is recommended. No intrahepatic or extrahepatic bile duct dilation. Gallbladder is surgically absent. Pancreas: Unremarkable. No pancreatic ductal dilatation or surrounding inflammatory changes. Spleen: Within normal limits. No focal lesion. Adrenals/Urinary Tract: Adrenal glands are unremarkable. No suspicious renal mass within the limitations of this unenhanced exam. There is a nearly completely exophytic 2.1 x 2.3 cm structure arising from the left kidney upper pole, with internal CT attenuation of 29 Hounsfield units. This is incompletely characterized on this unenhanced exam but is present since the prior study and favored to represent a proteinaceous/hemorrhagic cyst. This can also be better characterized on the MRI exam. No nephroureterolithiasis or obstructive uropathy. Unremarkable urinary bladder. Stomach/Bowel: There is a small sliding hiatal hernia. No disproportionate dilation of the small or large bowel loops. No evidence of abnormal bowel wall thickening or inflammatory changes. The appendix is unremarkable. There are multiple diverticula mainly in the left hemi colon, without imaging signs of diverticulitis. Vascular/Lymphatic: No ascites or pneumoperitoneum. There is an irregular approximately 1.4 x 1.7 cm soft tissue attenuation nodule in the right upper quadrant abutting the right inferior liver surface, favored to represent  metastatic deposit. No abdominal or pelvic lymphadenopathy, by size criteria. No aneurysmal dilation of the major abdominal arteries. There are minimal peripheral atherosclerotic vascular calcifications of the aorta and its major branches. Reproductive: Normal size prostate. Symmetric seminal vesicles. Other: There are bilateral small fat containing inguinal hernias. The soft tissues and abdominal wall are otherwise unremarkable. Musculoskeletal: There is a subcentimeter sized lytic lesion in the right iliac bone (610, image 95), which is incompletely characterized on the current exam but present since the prior study from March 2023 and favored benign. There are mild multilevel degenerative changes in the visualized spine. Lower thoracic, lumbar and upper sacral spinal fixation hardware noted including bilateral SI joint arthrodesis. IMPRESSION: 1. No acute traumatic injury to the chest, abdomen or pelvis. 2. There is a new ill-defined heterogeneous hypoattenuating approximately 4.2 x 4.8 cm mass in the right hepatic lobe, segment 5/6, which is new since the prior study and highly concerning for neoplastic process. There is an additional smaller approximately 1 cm sized hypoattenuating structure in the right hepatic lobe, segment 8, which is also concerning for neoplastic process. Further evaluation with multiphasic contrast-enhanced MRI or CT scan abdomen as per liver mass protocol is recommended. 3. There is an irregular approximately 1.4 x 1.7 cm soft tissue attenuation nodule in the right upper quadrant abutting the right inferior liver surface, favored to represent metastatic deposit. 4. There is a subpleural solid noncalcified 6 x 7 mm nodule in the left lung lower lobe, which is grossly unchanged since the prior study but new since the prior study from 12/18/2021. This is indeterminate. 5. Multiple other nonacute observations, as described above. Aortic Atherosclerosis (ICD10-I70.0). Electronically Signed    By: Jules Schick M.D.   On: 01/15/2024 11:56   CT L-SPINE NO CHARGE Result Date: 01/15/2024 CLINICAL DATA:  Worsening urinary frequency since  Wednesday. Dysuria. History of 4 falls in the last 2 weeks. EXAM: CT Lumbar Spine without contrast TECHNIQUE: Technique: Multiplanar CT images of the lumbar spine were reconstructed from contemporary CT of the Abdomen and Pelvis. RADIATION DOSE REDUCTION: This exam was performed according to the departmental dose-optimization program which includes automated exposure control, adjustment of the mA and/or kV according to patient size and/or use of iterative reconstruction technique. CONTRAST:  None COMPARISON:  Lumbar radiography 03/30/2020 FINDINGS: Segmentation: 5 lumbar type vertebrae Alignment: No acute malalignment.  Dextroscoliosis. Vertebrae: Thoracic (at least T10) to pelvic fusion solid arthrodesis is seen at L3-L5. There is vacuum phenomenon in the L5-S1 disc space with lucency around the S1 and pelvic screws. Intervertebral gas also seen at upper lumbar levels including L2-3. No acute fracture or aggressive bone lesion. Hip osteoarthritis with joint space narrowing on the left more than right. Paraspinal and other soft tissues: Extensive postoperative scarring and fatty muscular atrophy at levels of broad laminectomy in the lumbar spine. Disc levels: No high-grade bony spinal stenosis is seen after laminectomy. Degenerative foraminal impingement on the left at L2-3 which appears advanced. Other foraminal narrowings are mild-to-moderate. IMPRESSION: 1. No acute finding. 2. 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 3. Advanced degeneration with multilevel laminectomy. Electronically Signed   By: Tiburcio Pea M.D.   On: 01/15/2024 11:44   CT Head Wo Contrast Result Date: 01/15/2024 CLINICAL DATA:  Head trauma, minor (Age >= 65y); Neck trauma (Age >= 65y); Facial trauma, blunt EXAM: CT HEAD WITHOUT CONTRAST CT MAXILLOFACIAL  WITHOUT CONTRAST CT CERVICAL SPINE WITHOUT CONTRAST TECHNIQUE: Multidetector CT imaging of the head, cervical spine, and maxillofacial structures were performed using the standard protocol without intravenous contrast. Multiplanar CT image reconstructions of the cervical spine and maxillofacial structures were also generated. RADIATION DOSE REDUCTION: This exam was performed according to the departmental dose-optimization program which includes automated exposure control, adjustment of the mA and/or kV according to patient size and/or use of iterative reconstruction technique. COMPARISON:  12/09/2021 FINDINGS: CT HEAD FINDINGS Brain: No evidence of acute infarction, hemorrhage, hydrocephalus, extra-axial collection or mass lesion/mass effect. Patchy low-density changes within the periventricular and subcortical white matter most compatible with chronic microvascular ischemic change. Moderate diffuse cerebral volume loss. Vascular: No hyperdense vessel or unexpected calcification. Skull: Prior left parietal craniotomy.  No acute calvarial fracture. Other: Negative for scalp hematoma. CT MAXILLOFACIAL FINDINGS Osseous: No acute maxillofacial bone fracture. Bony orbital walls are intact. Mandible intact. Temporomandibular joints are aligned without dislocation. Orbits: Negative. No traumatic or inflammatory finding. Sinuses: Clear. Soft tissues: Negative. CT CERVICAL SPINE FINDINGS Alignment: Facet joints are aligned without dislocation or traumatic listhesis. Dens and lateral masses are aligned. Skull base and vertebrae: No acute fracture. No primary bone lesion or focal pathologic process. Soft tissues and spinal canal: No prevertebral fluid or swelling. No visible canal hematoma. Disc levels:  Moderate multilevel cervical spondylosis. Upper chest: Included lung apices are clear. Other: Bilateral carotid atherosclerosis. IMPRESSION: 1. No acute intracranial abnormality. 2. No acute maxillofacial bone fracture. 3. No  acute fracture or subluxation of the cervical spine. Electronically Signed   By: Duanne Guess D.O.   On: 01/15/2024 11:42   CT Cervical Spine Wo Contrast Result Date: 01/15/2024 CLINICAL DATA:  Head trauma, minor (Age >= 65y); Neck trauma (Age >= 65y); Facial trauma, blunt EXAM: CT HEAD WITHOUT CONTRAST CT MAXILLOFACIAL WITHOUT CONTRAST CT CERVICAL SPINE WITHOUT CONTRAST TECHNIQUE: Multidetector CT imaging of the head, cervical spine, and maxillofacial structures  were performed using the standard protocol without intravenous contrast. Multiplanar CT image reconstructions of the cervical spine and maxillofacial structures were also generated. RADIATION DOSE REDUCTION: This exam was performed according to the departmental dose-optimization program which includes automated exposure control, adjustment of the mA and/or kV according to patient size and/or use of iterative reconstruction technique. COMPARISON:  12/09/2021 FINDINGS: CT HEAD FINDINGS Brain: No evidence of acute infarction, hemorrhage, hydrocephalus, extra-axial collection or mass lesion/mass effect. Patchy low-density changes within the periventricular and subcortical white matter most compatible with chronic microvascular ischemic change. Moderate diffuse cerebral volume loss. Vascular: No hyperdense vessel or unexpected calcification. Skull: Prior left parietal craniotomy.  No acute calvarial fracture. Other: Negative for scalp hematoma. CT MAXILLOFACIAL FINDINGS Osseous: No acute maxillofacial bone fracture. Bony orbital walls are intact. Mandible intact. Temporomandibular joints are aligned without dislocation. Orbits: Negative. No traumatic or inflammatory finding. Sinuses: Clear. Soft tissues: Negative. CT CERVICAL SPINE FINDINGS Alignment: Facet joints are aligned without dislocation or traumatic listhesis. Dens and lateral masses are aligned. Skull base and vertebrae: No acute fracture. No primary bone lesion or focal pathologic process. Soft  tissues and spinal canal: No prevertebral fluid or swelling. No visible canal hematoma. Disc levels:  Moderate multilevel cervical spondylosis. Upper chest: Included lung apices are clear. Other: Bilateral carotid atherosclerosis. IMPRESSION: 1. No acute intracranial abnormality. 2. No acute maxillofacial bone fracture. 3. No acute fracture or subluxation of the cervical spine. Electronically Signed   By: Duanne Guess D.O.   On: 01/15/2024 11:42   CT Maxillofacial Wo Contrast Result Date: 01/15/2024 CLINICAL DATA:  Head trauma, minor (Age >= 65y); Neck trauma (Age >= 65y); Facial trauma, blunt EXAM: CT HEAD WITHOUT CONTRAST CT MAXILLOFACIAL WITHOUT CONTRAST CT CERVICAL SPINE WITHOUT CONTRAST TECHNIQUE: Multidetector CT imaging of the head, cervical spine, and maxillofacial structures were performed using the standard protocol without intravenous contrast. Multiplanar CT image reconstructions of the cervical spine and maxillofacial structures were also generated. RADIATION DOSE REDUCTION: This exam was performed according to the departmental dose-optimization program which includes automated exposure control, adjustment of the mA and/or kV according to patient size and/or use of iterative reconstruction technique. COMPARISON:  12/09/2021 FINDINGS: CT HEAD FINDINGS Brain: No evidence of acute infarction, hemorrhage, hydrocephalus, extra-axial collection or mass lesion/mass effect. Patchy low-density changes within the periventricular and subcortical white matter most compatible with chronic microvascular ischemic change. Moderate diffuse cerebral volume loss. Vascular: No hyperdense vessel or unexpected calcification. Skull: Prior left parietal craniotomy.  No acute calvarial fracture. Other: Negative for scalp hematoma. CT MAXILLOFACIAL FINDINGS Osseous: No acute maxillofacial bone fracture. Bony orbital walls are intact. Mandible intact. Temporomandibular joints are aligned without dislocation. Orbits:  Negative. No traumatic or inflammatory finding. Sinuses: Clear. Soft tissues: Negative. CT CERVICAL SPINE FINDINGS Alignment: Facet joints are aligned without dislocation or traumatic listhesis. Dens and lateral masses are aligned. Skull base and vertebrae: No acute fracture. No primary bone lesion or focal pathologic process. Soft tissues and spinal canal: No prevertebral fluid or swelling. No visible canal hematoma. Disc levels:  Moderate multilevel cervical spondylosis. Upper chest: Included lung apices are clear. Other: Bilateral carotid atherosclerosis. IMPRESSION: 1. No acute intracranial abnormality. 2. No acute maxillofacial bone fracture. 3. No acute fracture or subluxation of the cervical spine. Electronically Signed   By: Duanne Guess D.O.   On: 01/15/2024 11:42    I have personally spent 52 minutes involved in face-to-face and non-face-to-face activities for this patient on the day of the visit. Professional time spent includes  the following activities: Preparing to see the patient (review of tests), Obtaining and/or reviewing separately obtained history (admission/discharge record), Performing a medically appropriate examination and/or evaluation , Ordering medications/tests/procedures, referring and communicating with other health care professionals, Documenting clinical information in the EMR, Independently interpreting results (not separately reported), Communicating results to the patient/family/caregiver, Counseling and educating the patient/family/caregiver and Care coordination (not separately reported).   Plan d/w requesting provider as well as ID pharm D  Of note, portions of this note may have been created with voice recognition software. While this note has been edited for accuracy, occasional wrong-word or 'sound-a-like' substitutions may have occurred due to the inherent limitations of voice recognition software.   Electronically signed by:   Odette Fraction, MD Infectious  Disease Physician [facility] Pager: [phone]

## 2024-01-25 NOTE — Assessment & Plan Note (Signed)
-   Likely in setting of prolonged hospitalization and significant deconditioning - Continue Foley at discharge and will need trial of void in about 1 week

## 2024-01-26 DIAGNOSIS — R3912 Poor urinary stream: Secondary | ICD-10-CM | POA: Diagnosis not present

## 2024-01-26 DIAGNOSIS — R1310 Dysphagia, unspecified: Secondary | ICD-10-CM | POA: Diagnosis not present

## 2024-01-26 DIAGNOSIS — K5520 Angiodysplasia of colon without hemorrhage: Secondary | ICD-10-CM | POA: Diagnosis not present

## 2024-01-26 DIAGNOSIS — D649 Anemia, unspecified: Secondary | ICD-10-CM | POA: Diagnosis not present

## 2024-01-26 DIAGNOSIS — N139 Obstructive and reflux uropathy, unspecified: Secondary | ICD-10-CM | POA: Diagnosis not present

## 2024-01-26 DIAGNOSIS — S3993XA Unspecified injury of pelvis, initial encounter: Secondary | ICD-10-CM | POA: Diagnosis not present

## 2024-01-26 DIAGNOSIS — R351 Nocturia: Secondary | ICD-10-CM | POA: Diagnosis not present

## 2024-01-26 DIAGNOSIS — D1779 Benign lipomatous neoplasm of other sites: Secondary | ICD-10-CM | POA: Diagnosis present

## 2024-01-26 DIAGNOSIS — R339 Retention of urine, unspecified: Secondary | ICD-10-CM | POA: Diagnosis not present

## 2024-01-26 DIAGNOSIS — Z7901 Long term (current) use of anticoagulants: Secondary | ICD-10-CM | POA: Diagnosis not present

## 2024-01-26 DIAGNOSIS — E872 Acidosis, unspecified: Secondary | ICD-10-CM | POA: Diagnosis present

## 2024-01-26 DIAGNOSIS — E1140 Type 2 diabetes mellitus with diabetic neuropathy, unspecified: Secondary | ICD-10-CM | POA: Diagnosis not present

## 2024-01-26 DIAGNOSIS — F03911 Unspecified dementia, unspecified severity, with agitation: Secondary | ICD-10-CM | POA: Diagnosis not present

## 2024-01-26 DIAGNOSIS — N400 Enlarged prostate without lower urinary tract symptoms: Secondary | ICD-10-CM | POA: Diagnosis not present

## 2024-01-26 DIAGNOSIS — S299XXA Unspecified injury of thorax, initial encounter: Secondary | ICD-10-CM | POA: Diagnosis not present

## 2024-01-26 DIAGNOSIS — E1122 Type 2 diabetes mellitus with diabetic chronic kidney disease: Secondary | ICD-10-CM | POA: Diagnosis not present

## 2024-01-26 DIAGNOSIS — R911 Solitary pulmonary nodule: Secondary | ICD-10-CM | POA: Diagnosis not present

## 2024-01-26 DIAGNOSIS — D175 Benign lipomatous neoplasm of intra-abdominal organs: Secondary | ICD-10-CM | POA: Diagnosis not present

## 2024-01-26 DIAGNOSIS — K3189 Other diseases of stomach and duodenum: Secondary | ICD-10-CM | POA: Diagnosis not present

## 2024-01-26 DIAGNOSIS — E16A1 Hypoglycemia level 1: Secondary | ICD-10-CM | POA: Diagnosis not present

## 2024-01-26 DIAGNOSIS — I5031 Acute diastolic (congestive) heart failure: Secondary | ICD-10-CM | POA: Diagnosis not present

## 2024-01-26 DIAGNOSIS — F5104 Psychophysiologic insomnia: Secondary | ICD-10-CM | POA: Diagnosis not present

## 2024-01-26 DIAGNOSIS — K921 Melena: Secondary | ICD-10-CM | POA: Diagnosis present

## 2024-01-26 DIAGNOSIS — N179 Acute kidney failure, unspecified: Secondary | ICD-10-CM | POA: Diagnosis not present

## 2024-01-26 DIAGNOSIS — Z7401 Bed confinement status: Secondary | ICD-10-CM | POA: Diagnosis not present

## 2024-01-26 DIAGNOSIS — I350 Nonrheumatic aortic (valve) stenosis: Secondary | ICD-10-CM | POA: Diagnosis not present

## 2024-01-26 DIAGNOSIS — K5730 Diverticulosis of large intestine without perforation or abscess without bleeding: Secondary | ICD-10-CM | POA: Diagnosis not present

## 2024-01-26 DIAGNOSIS — I8500 Esophageal varices without bleeding: Secondary | ICD-10-CM | POA: Diagnosis not present

## 2024-01-26 DIAGNOSIS — E8809 Other disorders of plasma-protein metabolism, not elsewhere classified: Secondary | ICD-10-CM | POA: Diagnosis present

## 2024-01-26 DIAGNOSIS — N1330 Unspecified hydronephrosis: Secondary | ICD-10-CM | POA: Diagnosis not present

## 2024-01-26 DIAGNOSIS — F0390 Unspecified dementia without behavioral disturbance: Secondary | ICD-10-CM | POA: Diagnosis present

## 2024-01-26 DIAGNOSIS — M069 Rheumatoid arthritis, unspecified: Secondary | ICD-10-CM | POA: Diagnosis not present

## 2024-01-26 DIAGNOSIS — I10 Essential (primary) hypertension: Secondary | ICD-10-CM | POA: Diagnosis not present

## 2024-01-26 DIAGNOSIS — K5521 Angiodysplasia of colon with hemorrhage: Secondary | ICD-10-CM | POA: Diagnosis present

## 2024-01-26 DIAGNOSIS — R195 Other fecal abnormalities: Secondary | ICD-10-CM | POA: Diagnosis not present

## 2024-01-26 DIAGNOSIS — S0990XA Unspecified injury of head, initial encounter: Secondary | ICD-10-CM | POA: Diagnosis not present

## 2024-01-26 DIAGNOSIS — I82621 Acute embolism and thrombosis of deep veins of right upper extremity: Secondary | ICD-10-CM | POA: Diagnosis not present

## 2024-01-26 DIAGNOSIS — R41841 Cognitive communication deficit: Secondary | ICD-10-CM | POA: Diagnosis not present

## 2024-01-26 DIAGNOSIS — E1169 Type 2 diabetes mellitus with other specified complication: Secondary | ICD-10-CM | POA: Diagnosis not present

## 2024-01-26 DIAGNOSIS — R600 Localized edema: Secondary | ICD-10-CM | POA: Diagnosis not present

## 2024-01-26 DIAGNOSIS — Z8719 Personal history of other diseases of the digestive system: Secondary | ICD-10-CM | POA: Diagnosis not present

## 2024-01-26 DIAGNOSIS — R296 Repeated falls: Secondary | ICD-10-CM | POA: Diagnosis not present

## 2024-01-26 DIAGNOSIS — R350 Frequency of micturition: Secondary | ICD-10-CM | POA: Diagnosis not present

## 2024-01-26 DIAGNOSIS — R338 Other retention of urine: Secondary | ICD-10-CM | POA: Diagnosis not present

## 2024-01-26 DIAGNOSIS — E11649 Type 2 diabetes mellitus with hypoglycemia without coma: Secondary | ICD-10-CM | POA: Diagnosis not present

## 2024-01-26 DIAGNOSIS — G4700 Insomnia, unspecified: Secondary | ICD-10-CM | POA: Diagnosis not present

## 2024-01-26 DIAGNOSIS — W19XXXA Unspecified fall, initial encounter: Secondary | ICD-10-CM | POA: Diagnosis present

## 2024-01-26 DIAGNOSIS — R918 Other nonspecific abnormal finding of lung field: Secondary | ICD-10-CM | POA: Diagnosis not present

## 2024-01-26 DIAGNOSIS — I130 Hypertensive heart and chronic kidney disease with heart failure and stage 1 through stage 4 chronic kidney disease, or unspecified chronic kidney disease: Secondary | ICD-10-CM | POA: Diagnosis present

## 2024-01-26 DIAGNOSIS — D500 Iron deficiency anemia secondary to blood loss (chronic): Secondary | ICD-10-CM | POA: Diagnosis not present

## 2024-01-26 DIAGNOSIS — Z794 Long term (current) use of insulin: Secondary | ICD-10-CM | POA: Diagnosis not present

## 2024-01-26 DIAGNOSIS — D6832 Hemorrhagic disorder due to extrinsic circulating anticoagulants: Secondary | ICD-10-CM | POA: Diagnosis present

## 2024-01-26 DIAGNOSIS — K922 Gastrointestinal hemorrhage, unspecified: Secondary | ICD-10-CM | POA: Diagnosis not present

## 2024-01-26 DIAGNOSIS — I82409 Acute embolism and thrombosis of unspecified deep veins of unspecified lower extremity: Secondary | ICD-10-CM | POA: Diagnosis not present

## 2024-01-26 DIAGNOSIS — Z8673 Personal history of transient ischemic attack (TIA), and cerebral infarction without residual deficits: Secondary | ICD-10-CM | POA: Diagnosis not present

## 2024-01-26 DIAGNOSIS — F32A Depression, unspecified: Secondary | ICD-10-CM | POA: Diagnosis not present

## 2024-01-26 DIAGNOSIS — G9389 Other specified disorders of brain: Secondary | ICD-10-CM | POA: Diagnosis not present

## 2024-01-26 DIAGNOSIS — I5033 Acute on chronic diastolic (congestive) heart failure: Secondary | ICD-10-CM | POA: Diagnosis present

## 2024-01-26 DIAGNOSIS — E785 Hyperlipidemia, unspecified: Secondary | ICD-10-CM | POA: Diagnosis not present

## 2024-01-26 DIAGNOSIS — M7989 Other specified soft tissue disorders: Secondary | ICD-10-CM | POA: Diagnosis not present

## 2024-01-26 DIAGNOSIS — R531 Weakness: Secondary | ICD-10-CM | POA: Diagnosis not present

## 2024-01-26 DIAGNOSIS — R2689 Other abnormalities of gait and mobility: Secondary | ICD-10-CM | POA: Diagnosis not present

## 2024-01-26 DIAGNOSIS — Z992 Dependence on renal dialysis: Secondary | ICD-10-CM | POA: Diagnosis not present

## 2024-01-26 DIAGNOSIS — I824Y9 Acute embolism and thrombosis of unspecified deep veins of unspecified proximal lower extremity: Secondary | ICD-10-CM | POA: Diagnosis not present

## 2024-01-26 DIAGNOSIS — K449 Diaphragmatic hernia without obstruction or gangrene: Secondary | ICD-10-CM | POA: Diagnosis not present

## 2024-01-26 DIAGNOSIS — M0520 Rheumatoid vasculitis with rheumatoid arthritis of unspecified site: Secondary | ICD-10-CM | POA: Diagnosis not present

## 2024-01-26 DIAGNOSIS — E781 Pure hyperglyceridemia: Secondary | ICD-10-CM | POA: Diagnosis present

## 2024-01-26 DIAGNOSIS — Z66 Do not resuscitate: Secondary | ICD-10-CM | POA: Diagnosis present

## 2024-01-26 DIAGNOSIS — D62 Acute posthemorrhagic anemia: Secondary | ICD-10-CM | POA: Diagnosis present

## 2024-01-26 DIAGNOSIS — N183 Chronic kidney disease, stage 3 unspecified: Secondary | ICD-10-CM | POA: Diagnosis not present

## 2024-01-26 DIAGNOSIS — R4182 Altered mental status, unspecified: Secondary | ICD-10-CM | POA: Diagnosis not present

## 2024-01-26 DIAGNOSIS — I6782 Cerebral ischemia: Secondary | ICD-10-CM | POA: Diagnosis not present

## 2024-01-26 DIAGNOSIS — K750 Abscess of liver: Secondary | ICD-10-CM | POA: Diagnosis not present

## 2024-01-26 DIAGNOSIS — Z952 Presence of prosthetic heart valve: Secondary | ICD-10-CM | POA: Diagnosis not present

## 2024-01-26 DIAGNOSIS — M2681 Anterior soft tissue impingement: Secondary | ICD-10-CM | POA: Diagnosis not present

## 2024-01-26 DIAGNOSIS — S3991XA Unspecified injury of abdomen, initial encounter: Secondary | ICD-10-CM | POA: Diagnosis not present

## 2024-01-26 DIAGNOSIS — M6281 Muscle weakness (generalized): Secondary | ICD-10-CM | POA: Diagnosis not present

## 2024-01-26 DIAGNOSIS — N1832 Chronic kidney disease, stage 3b: Secondary | ICD-10-CM | POA: Diagnosis not present

## 2024-01-26 DIAGNOSIS — G3184 Mild cognitive impairment, so stated: Secondary | ICD-10-CM | POA: Diagnosis not present

## 2024-01-26 DIAGNOSIS — K769 Liver disease, unspecified: Secondary | ICD-10-CM | POA: Diagnosis not present

## 2024-01-26 DIAGNOSIS — N401 Enlarged prostate with lower urinary tract symptoms: Secondary | ICD-10-CM | POA: Diagnosis not present

## 2024-01-26 DIAGNOSIS — R2681 Unsteadiness on feet: Secondary | ICD-10-CM | POA: Diagnosis not present

## 2024-01-26 DIAGNOSIS — B9629 Other Escherichia coli [E. coli] as the cause of diseases classified elsewhere: Secondary | ICD-10-CM | POA: Diagnosis not present

## 2024-01-26 LAB — CBC
HCT: 26.1 % — ABNORMAL LOW (ref 39.0–52.0)
Hemoglobin: 8.4 g/dL — ABNORMAL LOW (ref 13.0–17.0)
MCH: 26.8 pg (ref 26.0–34.0)
MCHC: 32.2 g/dL (ref 30.0–36.0)
MCV: 83.1 fL (ref 80.0–100.0)
Platelets: 263 10*3/uL (ref 150–400)
RBC: 3.14 MIL/uL — ABNORMAL LOW (ref 4.22–5.81)
RDW: 18.4 % — ABNORMAL HIGH (ref 11.5–15.5)
WBC: 9.2 10*3/uL (ref 4.0–10.5)
nRBC: 0 % (ref 0.0–0.2)

## 2024-01-26 LAB — BASIC METABOLIC PANEL WITH GFR
Anion gap: 6 (ref 5–15)
BUN: 25 mg/dL — ABNORMAL HIGH (ref 8–23)
CO2: 22 mmol/L (ref 22–32)
Calcium: 8 mg/dL — ABNORMAL LOW (ref 8.9–10.3)
Chloride: 109 mmol/L (ref 98–111)
Creatinine, Ser: 1.76 mg/dL — ABNORMAL HIGH (ref 0.61–1.24)
GFR, Estimated: 39 mL/min — ABNORMAL LOW (ref >60)
Glucose, Bld: 125 mg/dL — ABNORMAL HIGH (ref 70–99)
Potassium: 3.1 mmol/L — ABNORMAL LOW (ref 3.5–5.1)
Sodium: 137 mmol/L (ref 135–145)

## 2024-01-26 LAB — GLUCOSE, CAPILLARY
Glucose-Capillary: 125 mg/dL — ABNORMAL HIGH (ref 70–99)
Glucose-Capillary: 232 mg/dL — ABNORMAL HIGH (ref 70–99)

## 2024-01-26 LAB — MAGNESIUM: Magnesium: 1.5 mg/dL — ABNORMAL LOW (ref 1.7–2.4)

## 2024-01-26 MED ORDER — POTASSIUM CHLORIDE CRYS ER 20 MEQ PO TBCR
40.0000 meq | EXTENDED_RELEASE_TABLET | ORAL | Status: AC
  Administered 2024-01-26 (×2): 40 meq via ORAL
  Filled 2024-01-26 (×2): qty 2

## 2024-01-26 MED ORDER — METOPROLOL TARTRATE 25 MG PO TABS: 12.5000 mg | ORAL_TABLET | Freq: Two times a day (BID) | ORAL | Status: AC

## 2024-01-26 MED ORDER — CIPROFLOXACIN HCL 500 MG PO TABS: 500.0000 mg | ORAL_TABLET | Freq: Two times a day (BID) | ORAL | 0 refills | Status: AC

## 2024-01-26 MED ORDER — CIPROFLOXACIN HCL 500 MG PO TABS: 500.0000 mg | ORAL_TABLET | Freq: Two times a day (BID) | ORAL | Status: DC

## 2024-01-26 MED ORDER — AMLODIPINE BESYLATE 10 MG PO TABS: 10.0000 mg | ORAL_TABLET | Freq: Every day | ORAL | Status: DC

## 2024-01-26 MED ORDER — TRAZODONE HCL 50 MG PO TABS: 25.0000 mg | ORAL_TABLET | Freq: Every evening | ORAL | Status: DC

## 2024-01-26 MED ORDER — MAGNESIUM SULFATE 4 GM/100ML IV SOLN
4.0000 g | Freq: Once | INTRAVENOUS | Status: AC
  Administered 2024-01-26: 4 g via INTRAVENOUS
  Filled 2024-01-26: qty 100

## 2024-01-26 MED ORDER — METRONIDAZOLE 500 MG PO TABS: 500.0000 mg | ORAL_TABLET | Freq: Two times a day (BID) | ORAL | Status: AC

## 2024-01-26 MED ORDER — APIXABAN 5 MG PO TABS: 5.0000 mg | ORAL_TABLET | Freq: Two times a day (BID) | ORAL | Status: DC

## 2024-01-26 NOTE — Progress Notes (Signed)
 Assessment unchanged. [facility] here to transport pt to [facility]. IV removed. Foley cathter to stay for another week. Report called to [place] and given to [place], Charity fundraiser.

## 2024-01-26 NOTE — Discharge Summary (Signed)
 Physician Discharge Summary   Meric Joye Ellzey WUJ:[id number] DOB: 1945/11/05 DOA: 01/15/2024  PCP: [name], DO  Admit date: 01/15/2024 Discharge date: 01/26/2024  Admitted From: Home  Disposition:  [facility] Discharging physician: Lewie Chamber, MD Barriers to discharge: none  Recommendations at discharge: Follow up with ID 4/22; will need 1 refill of cipro/flagyl on d/c from rehab (tx course 4 weeks, 1 refill) Repeat RUE duplex in 3 months and consider d/c Eliquis  Voiding trial in 1 week to remove foley Follow up/referral with ENT to evaluate posterior oropharynx Repeat CT chest in ~3 months Discontinue basal insulin if glucose levels drop   Discharge Condition: stable CODE STATUS: DNR Diet recommendation:  Diet Orders (From admission, onward)     Start     Ordered   01/22/24 0920  DIET DYS 3 Room service appropriate? Yes; Fluid consistency: Thin  Diet effective now       Question Answer Comment  Room service appropriate? Yes   Fluid consistency: Thin      01/22/24 0919            Hospital Course: Mr. Hogeland is a 78 yo male with PMH malignant melanoma, DMII, RA, GERD, hx CVA, HLD who presented with weakness, recurrent falls, and some persistent dark stools.  He had fallen about 1 week prior in the bathroom and struck his right face resulting in a bruised eye as well.  On workup he was noted to have Hgb 6.5 g/dL and underwent 2 units PRBC on admission. Multiple imaging studies were obtained due to his falls and no traumatic injuries were noted. He was however noted to have a right hepatic lobe lesion measuring 4.2 x 4.8 cm along with a left lower lobe nodule measuring 6 x 7 mm. GI was also consulted due to concern for GI bleed.  He has a known history of esophageal varices,  Schatzki ring, and gastric polyps on last EGD in Nov 2021.   He was admitted for ongoing GI evaluation and further workup regarding masses noted on scans.  Assessment and  Plan: * GI bleed - Possible upper GIB versus anemia from underlying suspected malignancy -Hemoglobin 6.5 g/dL on admission.  He does note dark stools been going on for a while at home; there has been associated weight loss as well - Received 2 units PRBC on admission initially  - GI consulted on admission also - EGD on 4/1: Grade 1 esophageal varices, Schatzki ring at GE junction.  Patchy erythematous mucosa in stomach with no bleeding - Biopsies suggestive of iron pill gastritis, chronic peptic duodenitis.  GI recommending ENT evaluation (planning for outpatient follow up - follow up placed in discharge instructions), IR evaluation of hepatic lesion (s/p biopsy).  (See report)   Hepatic abscess - Patient and wife aware of CT findings notably right hepatic lobe mass measuring 4.2 cm x 4.8 cm and left lower lobe lung nodule measuring 6 x 7 mm; CT also mentions suspected nodule measuring 1.4 x 1.7 cm in the right upper quadrant abutting inferior liver surface - Liver MRI on 3/31 noting this may be hepatic abscesses as they are rim enhancing lesions - biopsy obtained 4/2 of liver and findings consistent with abscess; culture also grew e.coli - ID consulted; patient to be on cipro/flagyl at discharge for 4 weeks (1 refill) with outpt followup planned with ID on 4/22  AKI (acute kidney injury) (HCC) - patient has history of CKD3b. Baseline creat ~ 1.8 - 2.2, eGFR~ 32 -  patient presents with increase in creat >0.3 mg/dL above baseline or creat increase >1.5x baseline presumed to have occurred within past 7 days PTA - creat 2.49 on admission - suspect volume depletion but may also progressive CKD given borderline GFR recently and at risk for progression - creat now stable   DVT (deep venous thrombosis) (HCC) - right acute DVT right radial vein and SVT involving basilic and cephalic vein -Continue on Eliquis for at least 3 months  Lung nodule - per CT on 3/30: "subpleural solid noncalcified 6 x 7  mm nodule in the left lung lower lobe, which is grossly unchanged since the prior study but new since the prior study from 12/18/2021" - will plan on outpatient repeat CT chest in 2-3 months then possible biopsy after if still warranted   Acute urinary retention - Likely in setting of prolonged hospitalization and significant deconditioning - Continue Foley at discharge and will need trial of void in about 1 week  Rheumatoid arteritis (HCC) - On Enbrel at home -Intermittent pain medications, database reviewed  BPH associated with nocturia - Continue finasteride  S/P TAVR (transcatheter aortic valve replacement) - Prior history of severe AS  Type 2 diabetes mellitus with diabetic neuropathy, unspecified (HCC) - Continue SSI and CBG monitoring for now -Last A1c 5.8% on 01/15/2024 -basal insulin resumed at d/c  Hyperlipidemia associated with type 2 diabetes mellitus (HCC) - On Zetia, fenofibrate at home  Essential hypertension - On lisinopril at home; hold for now   Posterior oropharynx abnormal with congested mucosa and hematin  ENT planning outpatient follow up, will need to call for follow up appt (placed in discharge instructions)   Proximal Upstream Varicose Veins Unclear cause.  Warrants additional follow up - per GI can be seen with significant congestive venous issues, r/o SVC syndrome or obstructive venous outflow  Upper extremity US as above (right upper extremity DVT).  CT chest abdomen pelvis reviewed, CT head and C spine reviewed.   Principal Diagnosis: GI bleed  Discharge Diagnoses: Active Hospital Problems   Diagnosis Date Noted   GI bleed 01/15/2024    Priority: 1.   Hepatic abscess 01/16/2024    Priority: 2.   AKI (acute kidney injury) (HCC)     Priority: 2.   DVT (deep venous thrombosis) (HCC) 01/25/2024    Priority: 3.   Lung nodule 01/17/2024    Priority: 3.   Acute urinary retention 01/25/2024    Priority: 4.   Liver abscess due to bacteria  01/25/2024   Urinary tract infection with hematuria 01/23/2024   Bacterial liver abscess 01/19/2024   Rheumatoid arteritis (HCC) 01/16/2024   Heme positive stool 01/16/2024   Abnormal CT of liver 01/16/2024   Acute on chronic anemia 01/16/2024   BPH associated with nocturia 10/05/2023   S/P TAVR (transcatheter aortic valve replacement) 01/26/2022   Essential hypertension 07/18/2017   Hyperlipidemia associated with type 2 diabetes mellitus (HCC) 07/18/2017   Type 2 diabetes mellitus with diabetic neuropathy, unspecified (HCC) 07/18/2017    Resolved Hospital Problems  No resolved problems to display.     Discharge Instructions     Increase activity slowly   Complete by: As directed    No wound care   Complete by: As directed       Allergies as of 01/26/2024       Reactions   Canagliflozin Other (See Comments)   Pt reports that the use of Invokana caused acute KIDNEY FAILURE ([facility] records) and "allergic," per [facility]  Aspirin Other (See Comments)   Gastroesophageal reflux and "allergic," per [facility]   Atorvastatin Other (See Comments)   Myalgias, GI upset, and "allergic," per [facility]   Statins Other (See Comments)   Muscle pain- "allergic," per [facility]        Medication List     PAUSE taking these medications    aspirin EC 81 MG tablet Wait to take this until your doctor or other care provider tells you to start again. Hold while on Eliquis Take 1 tablet (81 mg total) by mouth daily. Swallow whole.       STOP taking these medications    amoxicillin 500 MG tablet Commonly known as: AMOXIL   cefdinir 300 MG capsule Commonly known as: OMNICEF   HYDROcodone-acetaminophen 7.5-325 MG tablet Commonly known as: Norco       TAKE these medications    acetaminophen 325 MG tablet Commonly known as: TYLENOL Take 650 mg by mouth every 6 (six) hours as needed for mild pain or headache.   amLODipine 10 MG tablet Commonly known as: NORVASC Take 1 tablet (10 mg total) by mouth  daily. Start taking on: January 27, 2024   apixaban 5 MG Tabs tablet Commonly known as: ELIQUIS Take 1 tablet (5 mg total) by mouth 2 (two) times daily.   B COMPLEX PO Take 1 tablet by mouth daily.   ciprofloxacin 500 MG tablet Commonly known as: CIPRO Take 1 tablet (500 mg total) by mouth 2 (two) times daily.   cyanocobalamin 1000 MCG tablet Commonly known as: VITAMIN B12 Take 1,000 mcg by mouth daily.   Enbrel Mini 50 MG/ML injection Generic drug: etanercept Inject 1 mL (50 mg total) into the skin once a week.   escitalopram 20 MG tablet Commonly known as: Lexapro Take 1 tablet (20 mg total) by mouth daily.   ezetimibe 10 MG tablet Commonly known as: ZETIA Take 1 tablet (10 mg total) by mouth daily.   fenofibrate micronized 200 MG capsule Commonly known as: LOFIBRA TAKE 1 CAPSULE DAILY BEFOREBREAKFAST   finasteride 5 MG tablet Commonly known as: PROSCAR Take 1 tablet (5 mg total) by mouth daily.   fluticasone 50 MCG/ACT nasal spray Commonly known as: FLONASE Place 2 sprays into both nostrils daily.   leflunomide 10 MG tablet Commonly known as: ARAVA TAKE 1 TABLET DAILY   lisinopril 40 MG tablet Commonly known as: ZESTRIL Take 1 tablet (40 mg total) by mouth in the morning.   magnesium oxide 400 MG tablet Commonly known as: MAG-OX Take 400 mg by mouth daily with lunch.   melatonin 5 MG Tabs Take 5 mg by mouth at bedtime.   metoprolol tartrate 25 MG tablet Commonly known as: LOPRESSOR Take 0.5 tablets (12.5 mg total) by mouth 2 (two) times daily.   metroNIDAZOLE 500 MG tablet Commonly known as: FLAGYL Take 1 tablet (500 mg total) by mouth every 12 (twelve) hours.   multivitamin with minerals tablet Take 1 tablet by mouth daily with breakfast.   NovoLOG FlexPen 100 UNIT/ML FlexPen Generic drug: insulin aspart Inject 0-20 Units into the skin See admin instructions. Inject 0-10 units into the skin before meals, PER SLIDING SCALE: BGL 0-200 = give  nothing, 201-250 = 2 units, 251-300 = 4 units, 301-350 = 6 units, 351-400 = 8 units, >400 = 10 units, push fluids, and repeat BGL check in 2 hours. If BGL remains >400 after 2 hours, CALL MD.   Omega 3 1000 MG Caps Take 2,000 mg by mouth daily with  lunch.   omeprazole 20 MG capsule Commonly known as: PRILOSEC Take 1 capsule (20 mg total) by mouth daily.   OneTouch Verio test strip Generic drug: glucose blood 1 each by Other route as needed for other (Glucose).   Potassium 99 MG Tabs Take 99 mg by mouth every evening.   traZODone 50 MG tablet Commonly known as: DESYREL Take 0.5 tablets (25 mg total) by mouth every evening. Give around 8pm   Tresiba FlexTouch 100 UNIT/ML FlexTouch Pen Generic drug: insulin degludec Inject 20 Units into the skin daily.   Vitamin D3 Super Strength 50 MCG (2000 UT) Caps Generic drug: Cholecalciferol Take 2,000 Units by mouth daily.   vitamin E 180 MG (400 UNITS) capsule Take 400 Units by mouth daily.        Contact information for follow-up providers     Scarlette Ar, MD Follow up.   Specialty: Otolaryngology Why: call for a follow up appointment for the abnormal findings noted on your EGD in the posterior oropharynx Contact information: [address] [place] [address] [phone]              Contact information for after-discharge care     Destination     [facility] .   Service: Skilled Nursing Contact information: [address] [place] [address] [phone]                    Allergies  Allergen Reactions   Canagliflozin Other (See Comments)    Pt reports that the use of Invokana caused acute KIDNEY FAILURE ([facility] records) and "allergic," per [facility]   Aspirin Other (See Comments)    Gastroesophageal reflux and "allergic," per [facility]    Atorvastatin Other (See Comments)    Myalgias, GI upset, and "allergic," per [facility]   Statins Other (See Comments)    Muscle  pain- "allergic," per [facility]    Consultations: GI ID  Procedures: 4/1: EGD  Discharge Exam: BP (!) 162/69 (BP Location: Left Arm)   Pulse 61   Temp (!) 97.5 F (36.4 C) (Oral)   Resp 18   Ht 5\' 11"  (1.803 m)   Wt 97 kg   SpO2 100%   BMI 29.82 kg/m  Physical Exam Constitutional:      General: He is not in acute distress.    Appearance: Normal appearance.  HENT:     Head: Normocephalic.     Comments: Bruising noted around right eye    Mouth/Throat:     Mouth: Mucous membranes are moist.  Eyes:     Extraocular Movements: Extraocular movements intact.  Cardiovascular:     Rate and Rhythm: Normal rate and regular rhythm.  Pulmonary:     Effort: Pulmonary effort is normal. No respiratory distress.     Breath sounds: Normal breath sounds. No wheezing.  Abdominal:     General: Bowel sounds are normal. There is no distension.     Palpations: Abdomen is soft.     Tenderness: There is no abdominal tenderness.  Musculoskeletal:        General: Normal range of motion.     Cervical back: Normal range of motion and neck supple.  Skin:    General: Skin is warm and dry.  Neurological:     General: No focal deficit present.     Mental Status: He is alert.  Psychiatric:        Mood and Affect: Mood normal.  Behavior: Behavior normal.      The results of significant diagnostics from this hospitalization (including imaging, microbiology, ancillary and laboratory) are listed below for reference.   Microbiology: Recent Results (from the past 240 hours)  Aerobic/Anaerobic Culture w Gram Stain (surgical/deep wound)     Status: None   Collection Time: 01/18/24  3:07 PM   Specimen: Abscess  Result Value Ref Range Status   Specimen Description   Final    ABSCESS Performed at [facility] [address], [place], [place] [address]    Special Requests   Final    NONE Performed at [facility], [address], [place], [place] [address]     Gram Stain   Final    MODERATE WBC PRESENT, PREDOMINANTLY PMN RARE GRAM NEGATIVE RODS    Culture   Final    MODERATE ESCHERICHIA COLI NO ANAEROBES ISOLATED Performed at [facility], [address] [place], [place] [address]    Report Status 01/23/2024 FINAL  Final   Organism ID, Bacteria ESCHERICHIA COLI  Final   Organism ID, Bacteria ESCHERICHIA COLI  Final      Susceptibility   Escherichia coli - MIC*    AMPICILLIN >=32 RESISTANT Resistant     CEFEPIME <=0.12 SENSITIVE Sensitive     CEFTAZIDIME <=1 SENSITIVE Sensitive     CEFTRIAXONE <=0.25 SENSITIVE Sensitive     CIPROFLOXACIN <=0.25 SENSITIVE Sensitive     GENTAMICIN <=1 SENSITIVE Sensitive     IMIPENEM <=0.25 SENSITIVE Sensitive     TRIMETH/SULFA <=20 SENSITIVE Sensitive     AMPICILLIN/SULBACTAM >=32 RESISTANT Resistant     PIP/TAZO 16 SENSITIVE Sensitive ug/mL   Escherichia coli - KIRBY BAUER*    CEFAZOLIN RESISTANT Resistant     * MODERATE ESCHERICHIA COLI    MODERATE ESCHERICHIA COLI     Labs: BNP (last 3 results) No results for input(s): "BNP" in the last 8760 hours. Basic Metabolic Panel: Recent Labs  Lab 01/22/24 0244 01/23/24 0443 01/24/24 0453 01/25/24 0432 01/26/24 0432  NA 133* 136 133* 132* 137  K 3.3* 3.4* 3.2* 3.2* 3.1*  CL 108 108 107 104 109  CO2 16* 17* 17* 19* 22  GLUCOSE 166* 114* 144* 123* 125*  BUN 43* 41* 39* 31* 25*  CREATININE 2.97* 3.01* 2.85* 2.26* 1.76*  CALCIUM 8.2* 8.3* 8.1* 8.0* 8.0*  MG 1.6* 1.6* 1.5* 1.5* 1.5*  PHOS 3.7 3.7 3.2 2.9  --    Liver Function Tests: Recent Labs  Lab 01/22/24 0244 01/23/24 0443 01/24/24 0453 01/25/24 0432  AST 15 18 18 19   ALT 16 14 14 15   ALKPHOS 49 47 47 49  BILITOT 0.4 0.5 0.5 0.4  PROT 5.9* 6.1* 5.9* 5.7*  ALBUMIN 2.4* 2.5* 2.5* 2.4*   No results for input(s): "LIPASE", "AMYLASE" in the last 168 hours. No results for input(s): "AMMONIA" in the last 168 hours. CBC: Recent Labs  Lab 01/20/24 0443 01/21/24 0716  01/22/24 0244 01/23/24 0443 01/24/24 0453 01/25/24 0432 01/26/24 0432  WBC 12.9* 12.1* 11.5* 11.5* 12.2* 9.1 9.2  NEUTROABS 10.0* 8.6*  --  8.1* 8.7* 6.0  --   HGB 7.6* 8.6* 9.2* 9.8* 8.9* 9.5* 8.4*  HCT 23.7* 26.3* 28.5* 30.0* 28.4* 30.6* 26.1*  MCV 84.3 83.5 84.1 83.6 85.3 86.7 83.1  PLT 329 327 311 316 316 309 263   Cardiac Enzymes: No results for input(s): "CKTOTAL", "CKMB", "CKMBINDEX", "TROPONINI" in the last 168 hours. BNP: Invalid input(s): "POCBNP" CBG: Recent Labs  Lab 01/25/24 1135 01/25/24 1608 01/25/24 2030 01/26/24 0719 01/26/24 1127  GLUCAP 151* 156* 130* 125* 232*   D-Dimer No results for input(s): "DDIMER" in the last 72 hours. Hgb A1c No results for input(s): "HGBA1C" in the last 72 hours. Lipid Profile No results for input(s): "CHOL", "HDL", "LDLCALC", "TRIG", "CHOLHDL", "LDLDIRECT" in the last 72 hours. Thyroid function studies No results for input(s): "TSH", "T4TOTAL", "T3FREE", "THYROIDAB" in the last 72 hours.  Invalid input(s): "FREET3" Anemia work up No results for input(s): "VITAMINB12", "FOLATE", "FERRITIN", "TIBC", "IRON", "RETICCTPCT" in the last 72 hours. Urinalysis    Component Value Date/Time   COLORURINE YELLOW 01/23/2024 1527   APPEARANCEUR HAZY (A) 01/23/2024 1527   APPEARANCEUR Clear 06/01/2023 1114   LABSPEC 1.010 01/23/2024 1527   PHURINE 5.0 01/23/2024 1527   GLUCOSEU 150 (A) 01/23/2024 1527   HGBUR SMALL (A) 01/23/2024 1527   BILIRUBINUR NEGATIVE 01/23/2024 1527   BILIRUBINUR Negative 01/11/2024 1150   BILIRUBINUR Negative 06/01/2023 1114   KETONESUR NEGATIVE 01/23/2024 1527   PROTEINUR 30 (A) 01/23/2024 1527   UROBILINOGEN 0.2 01/11/2024 1150   NITRITE NEGATIVE 01/23/2024 1527   LEUKOCYTESUR SMALL (A) 01/23/2024 1527   Sepsis Labs Recent Labs  Lab 01/23/24 0443 01/24/24 0453 01/25/24 0432 01/26/24 0432  WBC 11.5* 12.2* 9.1 9.2   Microbiology Recent Results (from the past 240 hours)  Aerobic/Anaerobic Culture  w Gram Stain (surgical/deep wound)     Status: None   Collection Time: 01/18/24  3:07 PM   Specimen: Abscess  Result Value Ref Range Status   Specimen Description   Final    ABSCESS Performed at [facility], [address], [place], [place] [address]    Special Requests   Final    NONE Performed at [facility], [address], [place], [place] [address]    Gram Stain   Final    MODERATE WBC PRESENT, PREDOMINANTLY PMN RARE GRAM NEGATIVE RODS    Culture   Final    MODERATE ESCHERICHIA COLI NO ANAEROBES ISOLATED Performed at [facility], [address], [place], [place] [address]    Report Status 01/23/2024 FINAL  Final   Organism ID, Bacteria ESCHERICHIA COLI  Final   Organism ID, Bacteria ESCHERICHIA COLI  Final      Susceptibility   Escherichia coli - MIC*    AMPICILLIN >=32 RESISTANT Resistant     CEFEPIME <=0.12 SENSITIVE Sensitive     CEFTAZIDIME <=1 SENSITIVE Sensitive     CEFTRIAXONE <=0.25 SENSITIVE Sensitive     CIPROFLOXACIN <=0.25 SENSITIVE Sensitive     GENTAMICIN <=1 SENSITIVE Sensitive     IMIPENEM <=0.25 SENSITIVE Sensitive     TRIMETH/SULFA <=20 SENSITIVE Sensitive     AMPICILLIN/SULBACTAM >=32 RESISTANT Resistant     PIP/TAZO 16 SENSITIVE Sensitive ug/mL   Escherichia coli - KIRBY BAUER*    CEFAZOLIN RESISTANT Resistant     * MODERATE ESCHERICHIA COLI    MODERATE ESCHERICHIA COLI    Procedures/Studies: CT RENAL STONE STUDY Result Date: 01/24/2024 CLINICAL DATA:  Acute renal injury and urinary frequency EXAM: CT ABDOMEN AND PELVIS WITHOUT CONTRAST TECHNIQUE: Multidetector CT imaging of the abdomen and pelvis was performed following the standard protocol without IV contrast. RADIATION DOSE REDUCTION: This exam was performed according to the departmental dose-optimization program which includes automated exposure control, adjustment of the mA and/or kV according to patient size and/or use of iterative reconstruction  technique. COMPARISON:  01/15/2024 FINDINGS: Lower chest: Minimal pleural effusions are noted  bilaterally. No focal infiltrate is seen. Hepatobiliary: Gallbladder has been surgically removed. Persistent hypodense mass lesion is noted within the right hepatic lobe overall stable in appearance from the prior exam. Pancreas: Unremarkable. No pancreatic ductal dilatation or surrounding inflammatory changes. Spleen: Normal in size without focal abnormality. Adrenals/Urinary Tract: Adrenal glands are within normal limits. Kidneys are well visualized bilaterally with stable simple cyst in the upper pole of the left kidney. No further follow-up is recommended. No renal calculi are seen. Fullness of the collecting systems is noted bilaterally likely related to over distention of the bladder as no calculi are seen. Increase in perinephric stranding on the left is noted which could be related to an underlying UTI. Correlation with laboratory values is recommended. Stomach/Bowel: Scattered diverticular change of the colon is noted. No evidence of diverticulitis is seen. The appendix is well visualized and within normal limits. Small bowel and stomach show no evidence of small sliding-type hiatal hernia. Vascular/Lymphatic: Aortic atherosclerosis. No enlarged abdominal or pelvic lymph nodes. Reproductive: Prostate is unremarkable. Other: No abdominal wall hernia or abnormality. No abdominopelvic ascites. Musculoskeletal: Postsurgical changes and degenerative changes are noted in the thoracolumbar spine. IMPRESSION: Over distended bladder with associated hydronephrosis bilaterally. Increased perinephric stranding on the left with some involvement of the proximal ureter suspicious for underlying UTI. Stable hypodense mass lesion within the liver as previously seen on CT and MRI. Electronically Signed   By: Alcide Clever M.D.   On: 01/24/2024 10:49   DG Abd 1 View Result Date: 01/20/2024 CLINICAL DATA:  Nausea and vomiting. EXAM:  ABDOMEN - 1 VIEW COMPARISON:  Pelvic radiograph dated 03/28/2021. FINDINGS: No bowel dilatation or evidence of obstruction. Evaluation however is limited due to body habitus. Right upper quadrant cholecystectomy clips. Extensive spinal fusion hardware. No acute osseous pathology. IMPRESSION: Nonobstructive bowel gas pattern. Electronically Signed   By: Elgie Collard M.D.   On: 01/20/2024 18:02   ECHOCARDIOGRAM COMPLETE Result Date: 01/20/2024    ECHOCARDIOGRAM REPORT   Patient Name:   WYAT INFINGER Date of Exam: 01/20/2024 Medical Rec #:  [id number]         Height:       71.0 in Accession #:    [id number]        Weight:       213.8 lb Date of Birth:  11-Sep-1946          BSA:          2.169 m Patient Age:    78 years          BP:           180/85 mmHg Patient Gender: M                 HR:           70 bpm. Exam Location:  Inpatient Procedure: 2D Echo, 3D Echo, Cardiac Doppler, Color Doppler and Strain Analysis            (Both Spectral and Color Flow Doppler were utilized during            procedure). Indications:    S/P AVR  History:        Patient has prior history of Echocardiogram examinations, most                 recent 01/26/2023. Risk Factors:Hypertension, Diabetes,                 Dyslipidemia and Former Smoker.  Aortic Valve: 29 mm Sapien prosthetic, stented (TAVR) valve is                 present in the aortic position.  Sonographer:    Karma Ganja Referring Phys: [phone] A CALDWELL POWELL JR  Sonographer Comments: Global longitudinal strain was attempted. IMPRESSIONS  1. Left ventricular ejection fraction, by estimation, is 55 to 60%. Left ventricular ejection fraction by 3D volume is 54 %. Left ventricular ejection fraction by 2D MOD biplane is 55.3 %. The left ventricle has normal function. The left ventricle has no regional wall motion abnormalities. There is mild concentric left ventricular hypertrophy. Left ventricular diastolic parameters are consistent with Grade I diastolic  dysfunction (impaired relaxation). The average left ventricular global longitudinal strain is -13.2 %. The global longitudinal strain is abnormal.  2. Peak RV-RA gradient 23 mmHg. Right ventricular systolic function is normal. The right ventricular size is normal.  3. Left atrial size was mild to moderately dilated.  4. The mitral valve is degenerative. Trivial mitral valve regurgitation. Mild mitral stenosis. The mean mitral valve gradient is 6.0 mmHg, but calculated MVA by VTI is 2.97 cm^2. Moderate mitral annular calcification.  5. Bioprosthetic aortic valve s/p TAVR with 29 mm Edwards Sapien THV. Mean gradient 10 mmHg, EOA 2.73 cm^2. Dimensionless index 43. Trivial perivalvular leakage.  6. The IVC was not visualized. FINDINGS  Left Ventricle: Left ventricular ejection fraction, by estimation, is 55 to 60%. Left ventricular ejection fraction by 2D MOD biplane is 55.3 %. Left ventricular ejection fraction by 3D volume is 54 %. The left ventricle has normal function. The left ventricle has no regional wall motion abnormalities. The average left ventricular global longitudinal strain is -13.2 %. Strain was performed and the global longitudinal strain is abnormal. The left ventricular internal cavity size was normal in size. There is mild concentric left ventricular hypertrophy. Left ventricular diastolic parameters are consistent with Grade I diastolic dysfunction (impaired relaxation). Right Ventricle: Peak RV-RA gradient 23 mmHg. The right ventricular size is normal. No increase in right ventricular wall thickness. Right ventricular systolic function is normal. Left Atrium: Left atrial size was mild to moderately dilated. Right Atrium: Right atrial size was normal in size. Pericardium: There is no evidence of pericardial effusion. Mitral Valve: The mitral valve is degenerative in appearance. There is moderate calcification of the mitral valve leaflet(s). Moderate mitral annular calcification. Trivial mitral valve  regurgitation. Mild mitral valve stenosis. MV peak gradient, 15.1 mmHg. The mean mitral valve gradient is 6.0 mmHg. Tricuspid Valve: The tricuspid valve is normal in structure. Tricuspid valve regurgitation is trivial. Aortic Valve: Bioprosthetic aortic valve s/p TAVR with 29 mm Edwards Sapien THV. Mean gradient 10 mmHg, EOA 2.73 cm^2. Dimensionless index 43. Trivial perivalvular leakage. The aortic valve has been repaired/replaced. Aortic valve regurgitation is trivial. Aortic valve mean gradient measures 10.0 mmHg. Aortic valve peak gradient measures 16.6 mmHg. Aortic valve area, by VTI measures 2.73 cm. There is a 29 mm Sapien prosthetic, stented (TAVR) valve present in the aortic position. Pulmonic Valve: The pulmonic valve was normal in structure. Pulmonic valve regurgitation is trivial. Aorta: The aortic root is normal in size and structure. Venous: The IVC was not visualized. The inferior vena cava was not well visualized. IAS/Shunts: No atrial level shunt detected by color flow Doppler. Additional Comments: 3D was performed not requiring image post processing on an independent workstation and was normal.  LEFT VENTRICLE PLAX 2D  Biplane EF (MOD) LVIDd:         4.70 cm         LV Biplane EF:   Left LVIDs:         3.90 cm                          ventricular LV PW:         1.20 cm                          ejection LV IVS:        1.50 cm                          fraction by LVOT diam:     2.65 cm                          2D MOD LV SV:         118                              biplane is LV SV Index:   54                               55.3 %. LVOT Area:     5.52 cm                                Diastology                                LV e' medial:    5.11 cm/s LV Volumes (MOD)               LV E/e' medial:  22.7 LV vol d, MOD    164.0 ml      LV e' lateral:   5.00 cm/s A2C:                           LV E/e' lateral: 23.2 LV vol d, MOD    181.0 ml A4C:                           2D  Longitudinal LV vol s, MOD    75.6 ml       Strain A2C:                           2D Strain GLS   -13.2 % LV vol s, MOD    77.2 ml       Avg: A4C: LV SV MOD A2C:   88.4 ml       3D Volume EF LV SV MOD A4C:   181.0 ml      LV 3D EF:    Left LV SV MOD BP:    95.6 ml                    ventricul  ar                                             ejection                                             fraction                                             by 3D                                             volume is                                             54 %.                                 3D Volume EF:                                3D EF:        54 %                                LV EDV:       188 ml                                LV ESV:       86 ml                                LV SV:        102 ml RIGHT VENTRICLE RV Basal diam:  4.00 cm RV S prime:     14.30 cm/s TAPSE (M-mode): 3.0 cm LEFT ATRIUM              Index        RIGHT ATRIUM           Index LA diam:        4.90 cm  2.26 cm/m   RA Area:     18.10 cm LA Vol (A2C):   109.0 ml 50.25 ml/m  RA Volume:   43.90 ml  20.24 ml/m LA Vol (A4C):   65.7 ml  30.29 ml/m LA Biplane Vol: 87.4 ml  40.29 ml/m  AORTIC VALVE AV Area (Vmax):    2.54 cm AV Area (Vmean):   2.37 cm AV Area (VTI):     2.73 cm AV Vmax:           204.00 cm/s AV Vmean:  145.000 cm/s AV VTI:            0.433 m AV Peak Grad:      16.6 mmHg AV Mean Grad:      10.0 mmHg LVOT Vmax:         94.10 cm/s LVOT Vmean:        62.400 cm/s LVOT VTI:          0.214 m LVOT/AV VTI ratio: 0.49 MITRAL VALVE                TRICUSPID VALVE MV Area (PHT): 4.41 cm     TR Peak grad:   22.7 mmHg MV Area VTI:   2.97 cm     TR Vmax:        238.00 cm/s MV Peak grad:  15.1 mmHg MV Mean grad:  6.0 mmHg     SHUNTS MV Vmax:       1.94 m/s     Systemic VTI:  0.21 m MV Vmean:      121.0 cm/s   Systemic Diam: 2.65 cm MV Decel Time: 172 msec MV E velocity: 116.00 cm/s MV A  velocity: 177.00 cm/s MV E/A ratio:  0.66 Dalton McleanMD Electronically signed by Wilfred Lacy Signature Date/Time: 01/20/2024/3:30:56 PM    Final    VAS [place] UPPER EXTREMITY VENOUS DUPLEX Result Date: 01/20/2024 UPPER VENOUS STUDY  Patient Name:  DERELL BRUUN  Date of Exam:   01/19/2024 Medical Rec #: [id number]          Accession #:    [id number] Date of Birth: 17-Oct-1946           Patient Gender: M Patient Age:   75 years Exam Location:  [facility] Procedure:      VAS [place] UPPER EXTREMITY VENOUS DUPLEX Referring Phys: A POWELL JR --------------------------------------------------------------------------------  Indications: Edema, and Recent fall/injury. Other Indications: H/O CVA. Comparison Study: No prior exam. Performing Technologist: Fernande Bras  Examination Guidelines: A complete evaluation includes B-mode imaging, spectral Doppler, color Doppler, and power Doppler as needed of all accessible portions of each vessel. Bilateral testing is considered an integral part of a complete examination. Limited examinations for reoccurring indications may be performed as noted.  Right Findings: +----------+------------+---------+-----------+----------+-------+ RIGHT     CompressiblePhasicitySpontaneousPropertiesSummary +----------+------------+---------+-----------+----------+-------+ IJV           Full       Yes       Yes                      +----------+------------+---------+-----------+----------+-------+ Subclavian    Full       Yes       Yes                      +----------+------------+---------+-----------+----------+-------+ Axillary      Full       Yes       Yes                      +----------+------------+---------+-----------+----------+-------+ Brachial      Full       Yes       Yes                      +----------+------------+---------+-----------+----------+-------+ Radial      Partial      Yes       Yes                       +----------+------------+---------+-----------+----------+-------+  Ulnar         Full                                          +----------+------------+---------+-----------+----------+-------+ Cephalic    Partial      No        No                       +----------+------------+---------+-----------+----------+-------+ Basilic     Partial      Yes       Yes               At [facility]  +----------+------------+---------+-----------+----------+-------+ Thrombus noted in the basilic vein at the antecubital fossa, in the proximal segment of one of the paired radial veins, and in the cephalic vein in the mid forearm at the IV placement. Cystic structure noted in the right shoulder measuring 4.1 x 0.94 x 1.7 cm  Left Findings: +----------+------------+---------+-----------+----------+-------+ LEFT      CompressiblePhasicitySpontaneousPropertiesSummary +----------+------------+---------+-----------+----------+-------+ IJV           Full       Yes       Yes                      +----------+------------+---------+-----------+----------+-------+ Subclavian    Full       Yes       Yes                      +----------+------------+---------+-----------+----------+-------+ Axillary      Full       Yes       Yes                      +----------+------------+---------+-----------+----------+-------+ Brachial      Full       Yes       Yes                      +----------+------------+---------+-----------+----------+-------+ Radial        Full                                          +----------+------------+---------+-----------+----------+-------+ Ulnar         Full                                          +----------+------------+---------+-----------+----------+-------+ Cephalic      Full       Yes       Yes                      +----------+------------+---------+-----------+----------+-------+ Basilic       Full       Yes       Yes                       +----------+------------+---------+-----------+----------+-------+  Summary:  Right: Findings consistent with acute deep vein thrombosis involving the right radial veins. Findings consistent with acute superficial vein thrombosis involving the right basilic vein and right cephalic vein.  Left: No evidence of deep vein thrombosis in the upper  extremity. No evidence of superficial vein thrombosis in the upper extremity.  *See table(s) above for measurements and observations.  Diagnosing physician: Heath Lark Electronically signed by Heath Lark on 01/20/2024 at 1:10:44 PM.    Final    US BIOPSY (LIVER) Result Date: 01/18/2024 CLINICAL DATA:  Enlarging septated cystic appearing right hepatic lobe lesion. EXAM: ULTRASOUND-GUIDED CORE AND ASPIRATION LIVER BIOPSY TECHNIQUE: An ultrasound guided liver biopsy with possible drainage was thoroughly discussed with the patient and questions were answered. The benefits, risks, alternatives, and complications were also discussed. The patient understands and wishes to proceed with the procedure. A verbal as well as written consent was obtained. Survey ultrasound of the liver was performed, lesion localized, and an appropriate skin entry site was determined. Skin site was marked, prepped with chlorhexidine, and draped in usual sterile fashion, and infiltrated locally with 1% lidocaine. Intravenous Fentanyl and Versed 1mg  were administered by RN during a total moderate (conscious) sedation time of 10 minutes; the patient's level of consciousness and physiological / cardiorespiratory status were monitored continuously by radiology RN under my direct supervision. An 18 gauge spinal needle was advanced into the cystic appearing component. No fluid could be aspirated. The needle was removed. Subsequently, a 17 gauge trocar needle was advanced under ultrasound guidance to the margin of the lesion. Multiple solid-appearing. coaxial 18gauge core samples were then obtained  through the guide needle. Subsequently, a less than 2 mL viscous opaque fluid were could be aspirated through the guide needle, which was then removed. Post procedure scans demonstrate no apparent complication. COMPLICATIONS: COMPLICATIONS None immediate FINDINGS: Septated right hepatic lobe lesion was localized corresponding to recent MR. No fluid could be aspirated, so drain catheter placement was not attempted. Ultrasound-guided 18 gauge core biopsy samples of the lesion were obtained, sent for surgical pathology analysis. Ultrasound-guided 17 gauge aspiration viscous fluid sample from the lesion was obtained, sent for microbiology analysis. IMPRESSION: 1. Technically successful ultrasound guided core and aspiration liver lesion biopsy. Electronically Signed   By: Corlis Leak M.D.   On: 01/18/2024 18:52   [place] FNA SOFT TISSUE Result Date: 01/18/2024 CLINICAL DATA:  Enlarging septated cystic appearing right hepatic lobe lesion. EXAM: ULTRASOUND-GUIDED CORE AND ASPIRATION LIVER BIOPSY TECHNIQUE: An ultrasound guided liver biopsy with possible drainage was thoroughly discussed with the patient and questions were answered. The benefits, risks, alternatives, and complications were also discussed. The patient understands and wishes to proceed with the procedure. A verbal as well as written consent was obtained. Survey ultrasound of the liver was performed, lesion localized, and an appropriate skin entry site was determined. Skin site was marked, prepped with chlorhexidine, and draped in usual sterile fashion, and infiltrated locally with 1% lidocaine. Intravenous Fentanyl and Versed 1mg  were administered by RN during a total moderate (conscious) sedation time of 10 minutes; the patient's level of consciousness and physiological / cardiorespiratory status were monitored continuously by radiology RN under my direct supervision. An 18 gauge spinal needle was advanced into the cystic appearing component. No fluid  could be aspirated. The needle was removed. Subsequently, a 17 gauge trocar needle was advanced under ultrasound guidance to the margin of the lesion. Multiple solid-appearing. coaxial 18gauge core samples were then obtained through the guide needle. Subsequently, a less than 2 mL viscous opaque fluid were could be aspirated through the guide needle, which was then removed. Post procedure scans demonstrate no apparent complication. COMPLICATIONS: COMPLICATIONS None immediate FINDINGS: Septated right hepatic lobe lesion was localized corresponding to recent MR.  No fluid could be aspirated, so drain catheter placement was not attempted. Ultrasound-guided 18 gauge core biopsy samples of the lesion were obtained, sent for surgical pathology analysis. Ultrasound-guided 17 gauge aspiration viscous fluid sample from the lesion was obtained, sent for microbiology analysis. IMPRESSION: 1. Technically successful ultrasound guided core and aspiration liver lesion biopsy. Electronically Signed   By: Corlis Leak M.D.   On: 01/18/2024 18:52   MR LIVER W WO CONTRAST Result Date: 01/16/2024 CLINICAL DATA:  Liver lesion, malignancy suspected EXAM: MRI ABDOMEN WITHOUT AND WITH CONTRAST TECHNIQUE: Multiplanar multisequence MR imaging of the abdomen was performed both before and after the administration of intravenous contrast. CONTRAST:  10mL GADAVIST GADOBUTROL 1 MMOL/ML IV SOLN COMPARISON:  CT chest abdomen pelvis, 01/15/2024 FINDINGS: Lower chest: No acute abnormality.  Small hiatal hernia. Hepatobiliary: Multilobulated, internally septated rim enhancing lesion within the inferior right lobe of the liver, hepatic segment V/VI measuring 4.5 x 3.7 cm (series 11, image 46). Second small component which is inseparable from this larger lesion and the liver capsule situated just inferiorly measuring 2.5 x 2.4 cm (series 11, image 63). These lesions demonstrate internal diffusion restriction. Additional small enhancing lesion in the  anterior right lobe of the liver, hepatic segment VIII measuring 1.2 x 1.0 cm without internal hypoenhancement or fluid character (series 11, image 24). Status post cholecystectomy. No biliary ductal dilatation. Pancreas: Unremarkable. No pancreatic ductal dilatation or surrounding inflammatory changes. Spleen: Normal in size without significant abnormality. Adrenals/Urinary Tract: Adrenal glands are unremarkable. Layering hemorrhagic or proteinaceous cyst arising from the posterior superior pole of the left kidney benign, requiring no further follow-up or characterization (series 11, image 50). Kidneys are otherwise normal, without obvious renal calculi, solid lesion, or hydronephrosis. Stomach/Bowel: Stomach is within normal limits. No evidence of bowel wall thickening, distention, or inflammatory changes. Vascular/Lymphatic: No significant vascular findings are present. No enlarged abdominal lymph nodes. Other: No abdominal wall hernia or abnormality. No ascites. Musculoskeletal: No acute or significant osseous findings. IMPRESSION: 1. Multilobulated, internally septated rim enhancing lesion within the inferior right lobe of the liver, hepatic segment V/VI measuring 4.5 x 3.7 cm. Second small component which is inseparable from this larger lesion and the liver capsule situated just inferiorly measuring 2.5 x 2.4 cm. Imaging characteristics are strongly suggestive of hepatic abscess, however a cystic metastasis could also have this appearance depending upon clinical presentation. 2. Additional small enhancing lesion in the anterior right lobe of the liver, hepatic segment VIII measuring 1.2 x 1.0 cm without internal hypoenhancement or fluid character. This is less specific, and could reflect a small phlegmon or additional metastasis. 3. No lymphadenopathy or other evidence of metastatic disease in the abdomen. Electronically Signed   By: Jearld Lesch M.D.   On: 01/16/2024 22:38   CT CHEST ABDOMEN PELVIS WO  CONTRAST Result Date: 01/15/2024 CLINICAL DATA:  Abdominal trauma, blunt. Multiple falls in last 2 weeks. Worsening urinary frequency. Dysuria. * Tracking Code: BO * EXAM: CT CHEST, ABDOMEN AND PELVIS WITHOUT CONTRAST TECHNIQUE: Multidetector CT imaging of the chest, abdomen and pelvis was performed following the standard protocol without IV contrast. RADIATION DOSE REDUCTION: This exam was performed according to the departmental dose-optimization program which includes automated exposure control, adjustment of the mA and/or kV according to patient size and/or use of iterative reconstruction technique. COMPARISON:  CT scan chest, abdomen and pelvis from 12/18/2021 and CT scan abdomen and pelvis from 08/21/2023 FINDINGS: CT CHEST FINDINGS Cardiovascular: Normal cardiac size. No pericardial effusion. No aortic aneurysm.  There are coronary artery calcifications, in keeping with coronary artery disease. Prosthetic aortic valve noted. There is dense mitral annulus calcification. Mediastinum/Nodes: Visualized thyroid gland appears grossly unremarkable. No solid / cystic mediastinal masses. The esophagus is nondistended precluding optimal assessment. No mediastinal or axillary lymphadenopathy by size criteria. Evaluation of bilateral hila is limited due to lack on intravenous contrast: however, no large hilar lymphadenopathy identified. Lungs/Pleura: The central tracheo-bronchial tree is patent. There are patchy areas of linear, plate-like atelectasis and/or scarring throughout bilateral lungs. No mass or consolidation. No pleural effusion or pneumothorax. There is a subpleural solid noncalcified 6 x 7 mm nodule in the left lung lower lobe (series 312, image 58), which is grossly unchanged since the prior study from 08/21/2023 but new since the prior study from 12/18/2021. There is a stable 2 mm calcified granuloma in the left lung lower lobe. Musculoskeletal: The visualized soft tissues of the chest wall are grossly  unremarkable. No suspicious osseous lesions. There are mild multilevel degenerative changes in the visualized spine. CT ABDOMEN PELVIS FINDINGS Hepatobiliary: The liver is normal in size. Non-cirrhotic configuration. There is a new ill-defined heterogeneous hypoattenuating approximately 4.2 x 4.8 cm mass in the right hepatic lobe, segment 5/6, which is new since the prior study and highly concerning for neoplastic process. There is an additional smaller approximately 1 cm sized hypoattenuating structure in the right hepatic lobe, segment 8, which is also concerning for neoplastic process. Further evaluation with multiphasic contrast-enhanced MRI or CT scan abdomen as per liver mass protocol is recommended. No intrahepatic or extrahepatic bile duct dilation. Gallbladder is surgically absent. Pancreas: Unremarkable. No pancreatic ductal dilatation or surrounding inflammatory changes. Spleen: Within normal limits. No focal lesion. Adrenals/Urinary Tract: Adrenal glands are unremarkable. No suspicious renal mass within the limitations of this unenhanced exam. There is a nearly completely exophytic 2.1 x 2.3 cm structure arising from the left kidney upper pole, with internal CT attenuation of 29 Hounsfield units. This is incompletely characterized on this unenhanced exam but is present since the prior study and favored to represent a proteinaceous/hemorrhagic cyst. This can also be better characterized on the MRI exam. No nephroureterolithiasis or obstructive uropathy. Unremarkable urinary bladder. Stomach/Bowel: There is a small sliding hiatal hernia. No disproportionate dilation of the small or large bowel loops. No evidence of abnormal bowel wall thickening or inflammatory changes. The appendix is unremarkable. There are multiple diverticula mainly in the left hemi colon, without imaging signs of diverticulitis. Vascular/Lymphatic: No ascites or pneumoperitoneum. There is an irregular approximately 1.4 x 1.7 cm soft  tissue attenuation nodule in the right upper quadrant abutting the right inferior liver surface, favored to represent metastatic deposit. No abdominal or pelvic lymphadenopathy, by size criteria. No aneurysmal dilation of the major abdominal arteries. There are minimal peripheral atherosclerotic vascular calcifications of the aorta and its major branches. Reproductive: Normal size prostate. Symmetric seminal vesicles. Other: There are bilateral small fat containing inguinal hernias. The soft tissues and abdominal wall are otherwise unremarkable. Musculoskeletal: There is a subcentimeter sized lytic lesion in the right iliac bone (610, image 95), which is incompletely characterized on the current exam but present since the prior study from March 2023 and favored benign. There are mild multilevel degenerative changes in the visualized spine. Lower thoracic, lumbar and upper sacral spinal fixation hardware noted including bilateral SI joint arthrodesis. IMPRESSION: 1. No acute traumatic injury to the chest, abdomen or pelvis. 2. There is a new ill-defined heterogeneous hypoattenuating approximately 4.2 x 4.8 cm mass in  the right hepatic lobe, segment 5/6, which is new since the prior study and highly concerning for neoplastic process. There is an additional smaller approximately 1 cm sized hypoattenuating structure in the right hepatic lobe, segment 8, which is also concerning for neoplastic process. Further evaluation with multiphasic contrast-enhanced MRI or CT scan abdomen as per liver mass protocol is recommended. 3. There is an irregular approximately 1.4 x 1.7 cm soft tissue attenuation nodule in the right upper quadrant abutting the right inferior liver surface, favored to represent metastatic deposit. 4. There is a subpleural solid noncalcified 6 x 7 mm nodule in the left lung lower lobe, which is grossly unchanged since the prior study but new since the prior study from 12/18/2021. This is indeterminate. 5.  Multiple other nonacute observations, as described above. Aortic Atherosclerosis (ICD10-I70.0). Electronically Signed   By: Jules Schick M.D.   On: 01/15/2024 11:56   CT L-SPINE NO CHARGE Result Date: 01/15/2024 CLINICAL DATA:  Worsening urinary frequency since Wednesday. Dysuria. History of 4 falls in the last 2 weeks. EXAM: CT Lumbar Spine without contrast TECHNIQUE: Technique: Multiplanar CT images of the lumbar spine were reconstructed from contemporary CT of the Abdomen and Pelvis. RADIATION DOSE REDUCTION: This exam was performed according to the departmental dose-optimization program which includes automated exposure control, adjustment of the mA and/or kV according to patient size and/or use of iterative reconstruction technique. CONTRAST:  None COMPARISON:  Lumbar radiography 03/30/2020 FINDINGS: Segmentation: 5 lumbar type vertebrae Alignment: No acute malalignment.  Dextroscoliosis. Vertebrae: Thoracic (at least T10) to pelvic fusion solid arthrodesis is seen at L3-L5. There is vacuum phenomenon in the L5-S1 disc space with lucency around the S1 and pelvic screws. Intervertebral gas also seen at upper lumbar levels including L2-3. No acute fracture or aggressive bone lesion. Hip osteoarthritis with joint space narrowing on the left more than right. Paraspinal and other soft tissues: Extensive postoperative scarring and fatty muscular atrophy at levels of broad laminectomy in the lumbar spine. Disc levels: No high-grade bony spinal stenosis is seen after laminectomy. Degenerative foraminal impingement on the left at L2-3 which appears advanced. Other foraminal narrowings are mild-to-moderate. IMPRESSION: 1. No acute finding. 2. 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 3. Advanced degeneration with multilevel laminectomy. Electronically Signed   By: Tiburcio Pea M.D.   On: 01/15/2024 11:44   CT Head Wo Contrast Result Date: 01/15/2024 CLINICAL DATA:  Head trauma,  minor (Age >= 65y); Neck trauma (Age >= 65y); Facial trauma, blunt EXAM: CT HEAD WITHOUT CONTRAST CT MAXILLOFACIAL WITHOUT CONTRAST CT CERVICAL SPINE WITHOUT CONTRAST TECHNIQUE: Multidetector CT imaging of the head, cervical spine, and maxillofacial structures were performed using the standard protocol without intravenous contrast. Multiplanar CT image reconstructions of the cervical spine and maxillofacial structures were also generated. RADIATION DOSE REDUCTION: This exam was performed according to the departmental dose-optimization program which includes automated exposure control, adjustment of the mA and/or kV according to patient size and/or use of iterative reconstruction technique. COMPARISON:  12/09/2021 FINDINGS: CT HEAD FINDINGS Brain: No evidence of acute infarction, hemorrhage, hydrocephalus, extra-axial collection or mass lesion/mass effect. Patchy low-density changes within the periventricular and subcortical white matter most compatible with chronic microvascular ischemic change. Moderate diffuse cerebral volume loss. Vascular: No hyperdense vessel or unexpected calcification. Skull: Prior left parietal craniotomy.  No acute calvarial fracture. Other: Negative for scalp hematoma. CT MAXILLOFACIAL FINDINGS Osseous: No acute maxillofacial bone fracture. Bony orbital walls are intact. Mandible intact. Temporomandibular joints are aligned without  dislocation. Orbits: Negative. No traumatic or inflammatory finding. Sinuses: Clear. Soft tissues: Negative. CT CERVICAL SPINE FINDINGS Alignment: Facet joints are aligned without dislocation or traumatic listhesis. Dens and lateral masses are aligned. Skull base and vertebrae: No acute fracture. No primary bone lesion or focal pathologic process. Soft tissues and spinal canal: No prevertebral fluid or swelling. No visible canal hematoma. Disc levels:  Moderate multilevel cervical spondylosis. Upper chest: Included lung apices are clear. Other: Bilateral carotid  atherosclerosis. IMPRESSION: 1. No acute intracranial abnormality. 2. No acute maxillofacial bone fracture. 3. No acute fracture or subluxation of the cervical spine. Electronically Signed   By: Duanne Guess D.O.   On: 01/15/2024 11:42   CT Cervical Spine Wo Contrast Result Date: 01/15/2024 CLINICAL DATA:  Head trauma, minor (Age >= 65y); Neck trauma (Age >= 65y); Facial trauma, blunt EXAM: CT HEAD WITHOUT CONTRAST CT MAXILLOFACIAL WITHOUT CONTRAST CT CERVICAL SPINE WITHOUT CONTRAST TECHNIQUE: Multidetector CT imaging of the head, cervical spine, and maxillofacial structures were performed using the standard protocol without intravenous contrast. Multiplanar CT image reconstructions of the cervical spine and maxillofacial structures were also generated. RADIATION DOSE REDUCTION: This exam was performed according to the departmental dose-optimization program which includes automated exposure control, adjustment of the mA and/or kV according to patient size and/or use of iterative reconstruction technique. COMPARISON:  12/09/2021 FINDINGS: CT HEAD FINDINGS Brain: No evidence of acute infarction, hemorrhage, hydrocephalus, extra-axial collection or mass lesion/mass effect. Patchy low-density changes within the periventricular and subcortical white matter most compatible with chronic microvascular ischemic change. Moderate diffuse cerebral volume loss. Vascular: No hyperdense vessel or unexpected calcification. Skull: Prior left parietal craniotomy.  No acute calvarial fracture. Other: Negative for scalp hematoma. CT MAXILLOFACIAL FINDINGS Osseous: No acute maxillofacial bone fracture. Bony orbital walls are intact. Mandible intact. Temporomandibular joints are aligned without dislocation. Orbits: Negative. No traumatic or inflammatory finding. Sinuses: Clear. Soft tissues: Negative. CT CERVICAL SPINE FINDINGS Alignment: Facet joints are aligned without dislocation or traumatic listhesis. Dens and lateral masses  are aligned. Skull base and vertebrae: No acute fracture. No primary bone lesion or focal pathologic process. Soft tissues and spinal canal: No prevertebral fluid or swelling. No visible canal hematoma. Disc levels:  Moderate multilevel cervical spondylosis. Upper chest: Included lung apices are clear. Other: Bilateral carotid atherosclerosis. IMPRESSION: 1. No acute intracranial abnormality. 2. No acute maxillofacial bone fracture. 3. No acute fracture or subluxation of the cervical spine. Electronically Signed   By: Duanne Guess D.O.   On: 01/15/2024 11:42   CT Maxillofacial Wo Contrast Result Date: 01/15/2024 CLINICAL DATA:  Head trauma, minor (Age >= 65y); Neck trauma (Age >= 65y); Facial trauma, blunt EXAM: CT HEAD WITHOUT CONTRAST CT MAXILLOFACIAL WITHOUT CONTRAST CT CERVICAL SPINE WITHOUT CONTRAST TECHNIQUE: Multidetector CT imaging of the head, cervical spine, and maxillofacial structures were performed using the standard protocol without intravenous contrast. Multiplanar CT image reconstructions of the cervical spine and maxillofacial structures were also generated. RADIATION DOSE REDUCTION: This exam was performed according to the departmental dose-optimization program which includes automated exposure control, adjustment of the mA and/or kV according to patient size and/or use of iterative reconstruction technique. COMPARISON:  12/09/2021 FINDINGS: CT HEAD FINDINGS Brain: No evidence of acute infarction, hemorrhage, hydrocephalus, extra-axial collection or mass lesion/mass effect. Patchy low-density changes within the periventricular and subcortical white matter most compatible with chronic microvascular ischemic change. Moderate diffuse cerebral volume loss. Vascular: No hyperdense vessel or unexpected calcification. Skull: Prior left parietal craniotomy.  No acute calvarial fracture. Other:  Negative for scalp hematoma. CT MAXILLOFACIAL FINDINGS Osseous: No acute maxillofacial bone fracture. Bony  orbital walls are intact. Mandible intact. Temporomandibular joints are aligned without dislocation. Orbits: Negative. No traumatic or inflammatory finding. Sinuses: Clear. Soft tissues: Negative. CT CERVICAL SPINE FINDINGS Alignment: Facet joints are aligned without dislocation or traumatic listhesis. Dens and lateral masses are aligned. Skull base and vertebrae: No acute fracture. No primary bone lesion or focal pathologic process. Soft tissues and spinal canal: No prevertebral fluid or swelling. No visible canal hematoma. Disc levels:  Moderate multilevel cervical spondylosis. Upper chest: Included lung apices are clear. Other: Bilateral carotid atherosclerosis. IMPRESSION: 1. No acute intracranial abnormality. 2. No acute maxillofacial bone fracture. 3. No acute fracture or subluxation of the cervical spine. Electronically Signed   By: Duanne Guess D.O.   On: 01/15/2024 11:42     Time coordinating discharge: Over 30 minutes    Lewie Chamber, MD  [facility] 01/26/2024, 2:16 PM

## 2024-01-26 NOTE — Progress Notes (Signed)
 Occupational Therapy Treatment Patient Details Name: Tommy Green MRN: [id number] DOB: [birth year] Today's Date: 01/26/2024   History of present illness 78 yo with progressive decline in subjective health; GI bleed (Hgb 6.1 on admission) and recent fall. Imaging revealed liver with lesions concerning for liver mets - workup underway. RUE DVT 01/19/24.  PMH: AS s/p tavr 01/2022, RA, gerd, dm2, cva, malignant melanoma, iron deficiency anemia, esophageal varices/portal hypertensive gastropathy, recent possible uti.   OT comments  Patient seen for skilled OT session this afternoon. Wife present at bedside for training. Patient now with Foley catheter and OT training with hygiene and LB dressing sequencing with + report of understanding. Patient with improved sit to stand this session with RW for LB bathing and dressing progression. Continues to require cues for sequencing and safety. Patient continues to benefit from Acute care OT services to progress function and safety. Patient will benefit from continued inpatient follow up therapy, <3 hours/day.        If plan is discharge home, recommend the following:  A lot of help with walking and/or transfers;A lot of help with bathing/dressing/bathroom;Assistance with cooking/housework;Direct supervision/assist for medications management;Direct supervision/assist for financial management;Assist for transportation;Help with stairs or ramp for entrance   Equipment Recommendations  Wheelchair (measurements OT);Wheelchair cushion (measurements OT);BSC/3in1 (0please complete in rehab)       Precautions / Restrictions Precautions Precautions: Fall Restrictions Weight Bearing Restrictions Per Provider Order: No       Mobility Bed Mobility  Pt up in recliner                   Transfers Overall transfer level: Needs assistance Equipment used: Rolling walker (2 wheels) Transfers: Sit to/from Stand Sit to Stand: Min assist, Mod assist     Step  pivot transfers: Min assist, Mod assist           Balance Overall balance assessment: History of Falls, Needs assistance Sitting-balance support: Feet supported Sitting balance-Leahy Scale: Fair     Standing balance support: During functional activity, Bilateral upper extremity supported, Reliant on assistive device for balance Standing balance-Leahy Scale: Fair Standing balance comment: unilateral support with RW for buttocks hygiene                           ADL either performed or assessed with clinical judgement   ADL Overall ADL's : Needs assistance/impaired Eating/Feeding: Independent   Grooming: Wash/dry hands;Wash/dry face;Oral care;Applying deodorant;Supervision/safety;Cueing for sequencing;Sitting   Upper Body Bathing: Contact guard assist;Sitting   Lower Body Bathing: Maximal assistance;Sitting/lateral leans;Sit to/from stand (cues for thorough buttocks hygiene)           Toilet Transfer: Moderate assistance;Minimal assistance;Cueing for safety;Cueing for sequencing;Stand-pivot;Rolling walker (2 wheels)   Toileting- Clothing Manipulation and Hygiene: Moderate assistance;Sitting/lateral lean;Sit to/from stand       Functional mobility during ADLs:  (improved overall sit to stand with min cues for forward weight shifts x 6 this session)      Extremity/Trunk Assessment Upper Extremity Assessment Upper Extremity Assessment: Generalized weakness   Lower Extremity Assessment Lower Extremity Assessment: Generalized weakness           Perception Perception Perception: Within Functional Limits   Praxis Praxis Praxis: Impaired Praxis Impairment Details: Motor planning   Communication Communication Communication: No apparent difficulties   Cognition Arousal: Alert Behavior During Therapy: WFL for tasks assessed/performed Cognition: Cognition impaired   Orientation impairments: Time, Situation Awareness: Intellectual awareness  impaired Memory impairment (select  all impairments): Short-term memory Attention impairment (select first level of impairment): Sustained attention Executive functioning impairment (select all impairments): Initiation, Organization, Sequencing, Reasoning, Problem solving OT - Cognition Comments: min cues for sequencing self care grooming routine                 Following commands: Intact        Cueing   Cueing Techniques: Verbal cues, Tactile cues, Visual cues        General Comments Foley cath in place    Pertinent Vitals/ Pain       Pain Assessment Pain Assessment: Faces Faces Pain Scale: No hurt   Frequency  Min 1X/week        Progress Toward Goals  OT Goals(current goals can now be found in the care plan section)  Progress towards OT goals: Progressing toward goals  Acute Rehab OT Goals Patient Stated Goal: to get to rehab and get stronger OT Goal Formulation: With patient/family Time For Goal Achievement: 02/02/24 Potential to Achieve Goals: Good ADL Goals Pt Will Perform Lower Body Bathing: with min assist;sit to/from stand Pt Will Transfer to Toilet: with min assist;ambulating Pt Will Perform Toileting - Clothing Manipulation and hygiene: with min assist;sitting/lateral leans;sit to/from stand Additional ADL Goal #1: Bed mobility with CGA in preparation for ADL tasks   AM-PAC OT "6 Clicks" Daily Activity     Outcome Measure   Help from another person eating meals?: None Help from another person taking care of personal grooming?: A Little Help from another person toileting, which includes using toliet, bedpan, or urinal?: A Lot Help from another person bathing (including washing, rinsing, drying)?: A Lot Help from another person to put on and taking off regular upper body clothing?: A Little Help from another person to put on and taking off regular lower body clothing?: A Lot 6 Click Score: 16    End of Session Equipment Utilized During Treatment:  Gait belt;Rolling walker (2 wheels)  OT Visit Diagnosis: Unsteadiness on feet (R26.81);Repeated falls (R29.6);Muscle weakness (generalized) (M62.81);Other symptoms and signs involving cognitive function;Pain   Activity Tolerance Patient tolerated treatment well   Patient Left in chair;with call bell/phone within reach;with family/visitor present   Nurse Communication Mobility status;Other (comment)        Time: 1914-7829 OT Time Calculation (min): 37 min  Charges: OT General Charges $OT Visit: 1 Visit OT Treatments $Self Care/Home Management : 23-37 mins  Tommy Green OT/L Acute Rehabilitation Department  [phone]  01/26/2024, 12:32 PM

## 2024-01-26 NOTE — TOC Transition Note (Signed)
 Transition of Care [facility]) - Discharge Note   Patient Details  Name: Tommy Green MRN: [id number] Date of Birth: 1946/10/04  [facility] CM/SW Contact:  Amada Jupiter, LCSW Phone Number: 01/26/2024, 2:59 PM   Clinical Narrative:     Pt medically cleared for dc to [facility] today.   Have secured insurance authorization.  Pt and wife aware and agreeable.  PTAR called at 2:55pm.  RN to call report to [phone].  No further TOC needs.  Final next level of care: Skilled Nursing Facility Barriers to Discharge: Barriers Resolved   Patient Goals and CMS Choice Patient states their goals for this hospitalization and ongoing recovery are:: home CMS Medicare.gov Compare Post Acute Care list provided to:: Patient   [place] ownership interest in [facility]provided to:: Patient    Discharge Placement   Existing PASRR number confirmed : 01/24/24          Patient chooses bed at: [facility] Patient to be transferred to facility by: PTAR Name of family member notified: wife Patient and family notified of of transfer: 01/26/24  Discharge Plan and Services Additional resources added to the After Visit Summary for     Discharge Planning Services: CM Consult                                 Social Drivers of Health (SDOH) Interventions SDOH Screenings   Food Insecurity: No Food Insecurity (01/16/2024)  Housing: Low Risk  (01/16/2024)  Transportation Needs: No Transportation Needs (01/16/2024)  Utilities: Not At Risk (01/16/2024)  Alcohol Screen: Low Risk  (11/04/2023)  Depression (PHQ2-9): Low Risk  (01/03/2024)  Financial Resource Strain: Low Risk  (11/04/2023)  Physical Activity: Inactive (11/04/2023)  Social Connections: Socially Integrated (01/15/2024)  Stress: No Stress Concern Present (11/04/2023)  Tobacco Use: Medium Risk (01/17/2024)  Health Literacy: Adequate Health Literacy (11/04/2023)     Readmission Risk Interventions     01/16/2024   12:29 PM  Readmission Risk Prevention Plan  Transportation Screening Complete  PCP or Specialist Appt within 5-7 Days Complete  Home Care Screening Complete  Medication Review (RN CM) Complete

## 2024-01-26 NOTE — Plan of Care (Signed)
 Problem: Education: Goal: Knowledge of General Education information will improve Description: Including pain rating scale, medication(s)/side effects and non-pharmacologic comfort measures Outcome: Adequate for Discharge   Problem: Health Behavior/Discharge Planning: Goal: Ability to manage health-related needs will improve Outcome: Adequate for Discharge   Problem: Clinical Measurements: Goal: Ability to maintain clinical measurements within normal limits will improve Outcome: Adequate for Discharge Goal: Will remain free from infection Outcome: Adequate for Discharge Goal: Diagnostic test results will improve Outcome: Adequate for Discharge Goal: Respiratory complications will improve Outcome: Adequate for Discharge Goal: Cardiovascular complication will be avoided Outcome: Adequate for Discharge   Problem: Activity: Goal: Risk for activity intolerance will decrease Outcome: Adequate for Discharge   Problem: Nutrition: Goal: Adequate nutrition will be maintained Outcome: Adequate for Discharge   Problem: Coping: Goal: Level of anxiety will decrease Outcome: Adequate for Discharge   Problem: Elimination: Goal: Will not experience complications related to bowel motility Outcome: Adequate for Discharge Goal: Will not experience complications related to urinary retention Outcome: Adequate for Discharge   Problem: Pain Managment: Goal: General experience of comfort will improve and/or be controlled Outcome: Adequate for Discharge   Problem: Safety: Goal: Ability to remain free from injury will improve Outcome: Adequate for Discharge   Problem: Skin Integrity: Goal: Risk for impaired skin integrity will decrease Outcome: Adequate for Discharge   Problem: Education: Goal: Knowledge of General Education information will improve Description: Including pain rating scale, medication(s)/side effects and non-pharmacologic comfort measures Outcome: Adequate for  Discharge   Problem: Health Behavior/Discharge Planning: Goal: Ability to manage health-related needs will improve Outcome: Adequate for Discharge   Problem: Clinical Measurements: Goal: Ability to maintain clinical measurements within normal limits will improve Outcome: Adequate for Discharge Goal: Will remain free from infection Outcome: Adequate for Discharge Goal: Diagnostic test results will improve Outcome: Adequate for Discharge Goal: Respiratory complications will improve Outcome: Adequate for Discharge Goal: Cardiovascular complication will be avoided Outcome: Adequate for Discharge   Problem: Activity: Goal: Risk for activity intolerance will decrease Outcome: Adequate for Discharge   Problem: Nutrition: Goal: Adequate nutrition will be maintained Outcome: Adequate for Discharge   Problem: Coping: Goal: Level of anxiety will decrease Outcome: Adequate for Discharge   Problem: Elimination: Goal: Will not experience complications related to bowel motility Outcome: Adequate for Discharge Goal: Will not experience complications related to urinary retention Outcome: Adequate for Discharge   Problem: Pain Managment: Goal: General experience of comfort will improve and/or be controlled Outcome: Adequate for Discharge   Problem: Safety: Goal: Ability to remain free from injury will improve Outcome: Adequate for Discharge   Problem: Skin Integrity: Goal: Risk for impaired skin integrity will decrease Outcome: Adequate for Discharge   Problem: Education: Goal: Ability to describe self-care measures that may prevent or decrease complications (Diabetes Survival Skills Education) will improve Outcome: Adequate for Discharge Goal: Individualized Educational Video(s) Outcome: Adequate for Discharge   Problem: Coping: Goal: Ability to adjust to condition or change in health will improve Outcome: Adequate for Discharge   Problem: Fluid Volume: Goal: Ability to  maintain a balanced intake and output will improve Outcome: Adequate for Discharge   Problem: Health Behavior/Discharge Planning: Goal: Ability to identify and utilize available resources and services will improve Outcome: Adequate for Discharge Goal: Ability to manage health-related needs will improve Outcome: Adequate for Discharge   Problem: Metabolic: Goal: Ability to maintain appropriate glucose levels will improve Outcome: Adequate for Discharge   Problem: Nutritional: Goal: Maintenance of adequate nutrition will improve Outcome: Adequate for Discharge  Goal: Progress toward achieving an optimal weight will improve Outcome: Adequate for Discharge   Problem: Skin Integrity: Goal: Risk for impaired skin integrity will decrease Outcome: Adequate for Discharge   Problem: Tissue Perfusion: Goal: Adequacy of tissue perfusion will improve Outcome: Adequate for Discharge   Problem: Acute Rehab OT Goals (only OT should resolve) Goal: Pt. Will Perform Lower Body Bathing Outcome: Adequate for Discharge Goal: Pt. Will Transfer To Toilet Outcome: Adequate for Discharge Goal: Pt. Will Perform Toileting-Clothing Manipulation Outcome: Adequate for Discharge Goal: OT Additional ADL Goal #1 Outcome: Adequate for Discharge   Problem: Acute Rehab PT Goals(only PT should resolve) Goal: Pt Will Go Supine/Side To Sit Outcome: Adequate for Discharge Goal: Patient Will Transfer Sit To/From Stand Outcome: Adequate for Discharge Goal: Pt Will Ambulate Outcome: Adequate for Discharge Goal: Pt/caregiver will Perform Home Exercise Program Outcome: Adequate for Discharge

## 2024-01-27 DIAGNOSIS — F03911 Unspecified dementia, unspecified severity, with agitation: Secondary | ICD-10-CM | POA: Diagnosis not present

## 2024-01-27 DIAGNOSIS — G4700 Insomnia, unspecified: Secondary | ICD-10-CM | POA: Diagnosis not present

## 2024-01-30 ENCOUNTER — Other Ambulatory Visit: Payer: Self-pay | Admitting: Rheumatology

## 2024-01-30 ENCOUNTER — Telehealth: Payer: Self-pay | Admitting: Rheumatology

## 2024-01-30 DIAGNOSIS — D500 Iron deficiency anemia secondary to blood loss (chronic): Secondary | ICD-10-CM | POA: Diagnosis not present

## 2024-01-30 DIAGNOSIS — I82621 Acute embolism and thrombosis of deep veins of right upper extremity: Secondary | ICD-10-CM | POA: Diagnosis not present

## 2024-01-30 DIAGNOSIS — R600 Localized edema: Secondary | ICD-10-CM | POA: Diagnosis not present

## 2024-01-30 DIAGNOSIS — E1122 Type 2 diabetes mellitus with diabetic chronic kidney disease: Secondary | ICD-10-CM | POA: Diagnosis not present

## 2024-01-30 DIAGNOSIS — R911 Solitary pulmonary nodule: Secondary | ICD-10-CM | POA: Diagnosis not present

## 2024-01-30 DIAGNOSIS — E1140 Type 2 diabetes mellitus with diabetic neuropathy, unspecified: Secondary | ICD-10-CM | POA: Diagnosis not present

## 2024-01-30 DIAGNOSIS — N139 Obstructive and reflux uropathy, unspecified: Secondary | ICD-10-CM | POA: Diagnosis not present

## 2024-01-30 DIAGNOSIS — K750 Abscess of liver: Secondary | ICD-10-CM | POA: Diagnosis not present

## 2024-01-30 DIAGNOSIS — E785 Hyperlipidemia, unspecified: Secondary | ICD-10-CM | POA: Diagnosis not present

## 2024-01-30 DIAGNOSIS — R195 Other fecal abnormalities: Secondary | ICD-10-CM | POA: Diagnosis not present

## 2024-01-30 DIAGNOSIS — M0579 Rheumatoid arthritis with rheumatoid factor of multiple sites without organ or systems involvement: Secondary | ICD-10-CM

## 2024-01-30 DIAGNOSIS — M069 Rheumatoid arthritis, unspecified: Secondary | ICD-10-CM | POA: Diagnosis not present

## 2024-01-30 DIAGNOSIS — N183 Chronic kidney disease, stage 3 unspecified: Secondary | ICD-10-CM | POA: Diagnosis not present

## 2024-01-30 MED ORDER — LEFLUNOMIDE 10 MG PO TABS: 10.0000 mg | ORAL_TABLET | Freq: Every day | ORAL | 0 refills | Status: AC

## 2024-01-30 NOTE — Telephone Encounter (Signed)
 Last Fill: 09/28/2023  Labs: 01/25/2024 RBC 3.53, Hgb 9.5, Hct 30.6, RDW 18.1, Sodium 132, Potassium 3.2, CO2 19, Glucose 123, BUN 31, Creat. 2.26, Calcium 8.0, Total Protein 5.7, Albumin 2.4  Next Visit: Due February 2025. Message sent to the front to schedule.   Last Visit: 06/29/2023  DX: Rheumatoid arthritis involving multiple sites with positive rheumatoid factor   Current Dose per office note 06/29/2023: Arava 10 mg 1 tablet by mouth daily   Okay to refill Arava ?

## 2024-01-30 NOTE — Telephone Encounter (Signed)
 Please schedule patient a follow up visit. Patient due February 2025. Thanks!   Follow-Up Instructions: Return in about 5 months (around 11/29/2023) for Rheumatoid arthritis, Osteoarthritis

## 2024-01-30 NOTE — Telephone Encounter (Signed)
 Patient's wife Tommy Green left a voicemail (01/27/24) to request a medication refill.   1. Name of Medication: Leflunomide  2. How are you currently taking this medication (dosage and times per day)?    3. What pharmacy would you like for that to be sent to? [facility]  Tommy Green stated that Tommy Green was discharged from the hospital and will be in rehab for the next 1-2 weeks.

## 2024-01-30 NOTE — Telephone Encounter (Signed)
 Patient's wife [name] called stating she will have to call back to schedule a follow-up appointment for [facility].  She states he is currently in rehab and they are deciding if he will be able to return home or go to skilled nursing.

## 2024-01-30 NOTE — Telephone Encounter (Signed)
 Attempted to contact patient and left message to advise patient to call the office and schedule appointment.

## 2024-02-01 DIAGNOSIS — R600 Localized edema: Secondary | ICD-10-CM | POA: Diagnosis not present

## 2024-02-01 DIAGNOSIS — N183 Chronic kidney disease, stage 3 unspecified: Secondary | ICD-10-CM | POA: Diagnosis not present

## 2024-02-07 ENCOUNTER — Other Ambulatory Visit: Payer: Self-pay

## 2024-02-07 ENCOUNTER — Ambulatory Visit (INDEPENDENT_AMBULATORY_CARE_PROVIDER_SITE_OTHER): Payer: Self-pay | Admitting: Internal Medicine

## 2024-02-07 VITALS — BP 135/74 | HR 60 | Temp 98.1°F | Resp 16

## 2024-02-07 DIAGNOSIS — K750 Abscess of liver: Secondary | ICD-10-CM | POA: Diagnosis not present

## 2024-02-07 DIAGNOSIS — I824Y9 Acute embolism and thrombosis of unspecified deep veins of unspecified proximal lower extremity: Secondary | ICD-10-CM | POA: Diagnosis not present

## 2024-02-07 DIAGNOSIS — R918 Other nonspecific abnormal finding of lung field: Secondary | ICD-10-CM

## 2024-02-07 DIAGNOSIS — R339 Retention of urine, unspecified: Secondary | ICD-10-CM | POA: Diagnosis not present

## 2024-02-07 NOTE — Patient Instructions (Signed)
 To do: Blood tests here  Ct scan to be set up to be done within 1-2 weeks (arrange with Daivd Dub at front desk)   Continue antibiotics: until we ask you to stop Ciprofloxacin  and Metronidazole     Please follow up: Primary care provider --> legs wrap, repeat duplex of extremities vs just stopping blood thinner around 3-6 months (?discussion with hematology doctor to see why dvt occurred), follow up lung nodules repeat ct scan Urology referral for foley management    See us  again in around 4-6 weeks

## 2024-02-07 NOTE — Progress Notes (Signed)
 [facility]  Patient Active Problem List   Diagnosis Date Noted   Liver abscess due to bacteria 01/25/2024   DVT (deep venous thrombosis) (HCC) 01/25/2024   Acute urinary retention 01/25/2024   Urinary tract infection with hematuria 01/23/2024   Bacterial liver abscess 01/19/2024   Lung nodule 01/17/2024   Hepatic abscess 01/16/2024   Rheumatoid arteritis (HCC) 01/16/2024   Heme positive stool 01/16/2024   Abnormal CT of liver 01/16/2024   Acute on chronic anemia 01/16/2024   GI bleed 01/15/2024   Depression, major, single episode, in partial remission (HCC) 10/05/2023   BPH associated with nocturia 10/05/2023   Statin myopathy 06/15/2023   IDA (iron  deficiency anemia) 07/06/2022   Current mild episode of major depressive disorder without prior episode (HCC) 05/17/2022   Other chronic pain 03/05/2022   Bilateral lower extremity edema 03/05/2022   Severe aortic stenosis 01/26/2022   S/P TAVR (transcatheter aortic valve replacement) 01/26/2022   Microcytic anemia 12/10/2021   Obesity (BMI 30-39.9) 12/10/2021   Near syncope 12/09/2021   Aortic stenosis 12/09/2021   Rheumatoid arthritis (HCC) 12/09/2021   Foot drop, left foot 10/02/2021   History of colonic polyps 10/02/2021   History of rheumatic fever 10/02/2021   Stress fracture of left foot 09/03/2021   Gabapentin  overdose, accidental or unintentional, initial encounter 03/08/2021   TIA (transient ischemic attack) 03/05/2021   Body mass index (BMI) 36.0-36.9, adult 10/03/2020   Subdural hemorrhage following injury without open intracranial wound and with prolonged loss of consciousness (more than 24 hours) without return to pre-existing conscious level (HCC) 10/03/2020   AKI (acute kidney injury) (HCC)    Acute encephalopathy    SDH (subdural hematoma) (HCC) 10/24/2019   Cyclic vomiting syndrome 08/06/2019   Neuropathy 12/25/2018   AK (actinic keratosis) 08/24/2018   Neoplasm of  uncertain behavior 07/27/2018   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   Tendinopathy of right gluteus medius 07/13/2018   Tendinopathy of left gluteus medius 07/13/2018   Squamous cell carcinoma in situ (SCCIS) of skin 03/24/2018   Vitamin D  deficiency 11/29/2017   Stage 3 chronic kidney disease (HCC) 10/28/2017   Primary osteoarthritis of right hip 09/11/2017   Essential hypertension 07/18/2017   Hyperlipidemia associated with type 2 diabetes mellitus (HCC) 07/18/2017   Type 2 diabetes mellitus with diabetic neuropathy, unspecified (HCC) 07/18/2017   Heart murmur 07/18/2017   Hypertriglyceridemia 07/18/2017   History of stroke 07/18/2017   DDD (degenerative disc disease), lumbar 06/15/2017   Iron  deficiency anemia 06/15/2017   Stroke (HCC) 06/15/2017   Encounter for hepatitis C screening test for low risk patient 06/30/2016   Anemia 06/19/2016   Microalbuminuria due to type 2 diabetes mellitus (HCC) 03/19/2016   Hypertrophy of inferior nasal turbinate 12/20/2015   Disease of nasal cavity and sinuses 12/10/2015   Degenerative tear of acetabular labrum of left hip 11/04/2015   History of tobacco abuse 05/15/2015   Morbid (severe) obesity due to excess calories (HCC) 04/11/2015   Disorder of both eustachian tubes 01/27/2015   Maxillary sinusitis 01/13/2015   Lipoprotein deficiency 01/31/2014   Gastro-esophageal reflux disease without esophagitis 11/02/2013   Rheumatic fever 11/02/2013   Erectile dysfunction associated with type 2 diabetes mellitus (HCC) 07/31/2013   Type 2 diabetes mellitus with hyperglycemia, without long-term current use of insulin  (HCC) 11/16/2012      Subjective:    Patient ID: Tommy Green, male    DOB: September 30, 1946, 78 y.o.   MRN: [id number]  Chief Complaint  Patient presents with   Hospitalization Follow-up    Liver abscess     HPI:  Tommy Green is a 78 y.o. male RA on Enbrel , former smoker, AS s/p tavr 01/2022, RA, hx gerd, dm2, hx cva, malignant  melanoma, iron  deficiency anemia, hx esophageal varices/portal hypertensive gastropathy (negative celiac disease on duodenal biopsy in 2021), recent possible uti, admitted for months to a year of progressive decline in subjective health, found to have severe anemia hb 6.1, and imaging of liver abscess vs metastasis   Admitted 01/15/24-01/26/24. Here for hospital f/u liver abscesses   Brief hx: Hx melanoma, and progressive decline in physical health the past year prior to admission. Admission Imaging with lesions concerning for liver mets which might be just infection or superimposed infection. ?possible dvt unprovoked upper ext also suggestive of cancer if confirmed dvt   Has TAVR, but no obvious sign of chf but gi did mention portal hypertensive changes ?from cardiac. Repeat echo unremarkable normal ef.   Duplex showed dvt upper and lower ext  4/2 gastric/duodenal bx A. STOMACH, BIOPSY:  -  Predominantly oxyntic type mucosa with reactive/reparative change and  mild chronic inactive gastritis with focal crystalline material  suggestive of iron  pill gastritis.  -  A Prussian blue stain shows focal weak positivity; however, the area  of interest is predominantly lost on levels  -  A calcium stain is negative.  -  An immunohistochemical stain for Helicobacter pylori organisms is  negative.   B. DUODENUM, BIOPSY:  -  Duodenal mucosa with prominent Brunner's glands and foveolar  metaplasia consistent with chronic peptic duodenitis.      4/2 liver biopsy A. LIVER, RIGHT, BIOPSY:  -  Liver with reactive fibrosis and acute inflammation consistent with  the clinical impression of abscess.       4/2 liver biopsy cx ecoli -- resistant to cefazolin , amp/sulbactam; S ceftriaxone , cipro , bactrim  Discharged 01/26/24 on cipro /flagyl    02/07/24 id clinic f/u Here with wife He has been staying in snf Reviewed snf mar and confirmed cipro /flagyl  500 mg bid being given Chronic legs edema  remains Reviewed discharge summary plan: 3 months repeat dupex and consider d/c eliquis  Voiding trial in 1 week from discharge to remove foley F/u ent to review posterior orpharynx hyperemia/congestion seen on egd (w/u for gib) Repat chest ct in 3 months for lung nodules    Allergies  Allergen Reactions   Canagliflozin Other (See Comments)    Pt reports that the use of Invokana caused acute KIDNEY FAILURE ([facility] records) and "allergic," per [facility]   Aspirin  Other (See Comments)    Gastroesophageal reflux and "allergic," per [facility]    Atorvastatin Other (See Comments)    Myalgias, GI upset, and "allergic," per [facility]   Statins Other (See Comments)    Muscle pain- "allergic," per [facility]      Outpatient Medications Prior to Visit  Medication Sig Dispense Refill   acetaminophen  (TYLENOL ) 325 MG tablet Take 650 mg by mouth every 6 (six) hours as needed for mild pain or headache.     amLODipine  (NORVASC ) 10 MG tablet Take 1 tablet (10 mg total) by mouth daily.     apixaban  (ELIQUIS ) 5 MG TABS tablet Take 1 tablet (5 mg total) by mouth 2 (two) times daily.     B Complex Vitamins (B COMPLEX PO) Take 1 tablet by mouth daily.     Cholecalciferol (VITAMIN D3 SUPER STRENGTH) 50 MCG (2000 UT) CAPS Take 2,000 Units  by mouth daily.     ciprofloxacin  (CIPRO ) 500 MG tablet Take 1 tablet (500 mg total) by mouth 2 (two) times daily. 60 tablet 0   escitalopram  (LEXAPRO ) 20 MG tablet Take 1 tablet (20 mg total) by mouth daily. 90 tablet 2   ezetimibe  (ZETIA ) 10 MG tablet Take 1 tablet (10 mg total) by mouth daily. 90 tablet 3   fenofibrate  micronized (LOFIBRA) 200 MG capsule TAKE 1 CAPSULE DAILY BEFOREBREAKFAST 90 capsule 3   finasteride  (PROSCAR ) 5 MG tablet Take 1 tablet (5 mg total) by mouth daily. 90 tablet 3   fluticasone  (FLONASE ) 50 MCG/ACT nasal spray Place 2 sprays into both nostrils daily. 16 g 6   insulin  aspart (NOVOLOG  FLEXPEN) 100 UNIT/ML FlexPen Inject 0-20 Units into the skin See admin  instructions. Inject 0-10 units into the skin before meals, PER SLIDING SCALE: BGL 0-200 = give nothing, 201-250 = 2 units, 251-300 = 4 units, 301-350 = 6 units, 351-400 = 8 units, >400 = 10 units, push fluids, and repeat BGL check in 2 hours. If BGL remains >400 after 2 hours, CALL MD.     insulin  degludec (TRESIBA  FLEXTOUCH) 100 UNIT/ML FlexTouch Pen Inject 20 Units into the skin daily.     leflunomide  (ARAVA ) 10 MG tablet Take 1 tablet (10 mg total) by mouth daily. 90 tablet 0   lisinopril  (ZESTRIL ) 40 MG tablet Take 1 tablet (40 mg total) by mouth in the morning. 90 tablet 3   magnesium  oxide (MAG-OX) 400 MG tablet Take 400 mg by mouth daily with lunch.     melatonin 5 MG TABS Take 5 mg by mouth at bedtime.     metoprolol  tartrate (LOPRESSOR ) 25 MG tablet Take 0.5 tablets (12.5 mg total) by mouth 2 (two) times daily.     metroNIDAZOLE  (FLAGYL ) 500 MG tablet Take 1 tablet (500 mg total) by mouth every 12 (twelve) hours.     Multiple Vitamins-Minerals (MULTIVITAMIN WITH MINERALS) tablet Take 1 tablet by mouth daily with breakfast.     Omega 3 1000 MG CAPS Take 2,000 mg by mouth daily with lunch.     omeprazole  (PRILOSEC) 20 MG capsule Take 1 capsule (20 mg total) by mouth daily. 90 capsule 3   ONETOUCH VERIO test strip 1 each by Other route as needed for other (Glucose).     potassium citrate (UROCIT-K) 10 MEQ (1080 MG) SR tablet Take 10 mEq by mouth 2 (two) times daily.     traZODone  (DESYREL ) 50 MG tablet Take 0.5 tablets (25 mg total) by mouth every evening. Give around 8pm     vitamin B-12 (CYANOCOBALAMIN ) 1000 MCG tablet Take 1,000 mcg by mouth daily.     vitamin E 180 MG (400 UNITS) capsule Take 400 Units by mouth daily.     aspirin  EC 81 MG tablet Take 1 tablet (81 mg total) by mouth daily. Swallow whole. (Patient not taking: Reported on 02/07/2024) 90 tablet 3   etanercept  (ENBREL  MINI) 50 MG/ML injection Inject 1 mL (50 mg total) into the skin once a week. 12 mL 0   Potassium 99 MG TABS  Take 99 mg by mouth every evening. (Patient not taking: Reported on 02/07/2024)     No facility-administered medications prior to visit.     Social History   Socioeconomic History   Marital status: Married    Spouse name: Not on file   Number of children: 2   Years of education: Not on file   Highest education level: Not  on file  Occupational History   Not on file  Tobacco Use   Smoking status: Former    Current packs/day: 0.00    Average packs/day: 1 pack/day for 10.0 years (10.0 ttl pk-yrs)    Types: Cigarettes    Start date: 42    Quit date: 73    Years since quitting: 52.3    Passive exposure: Past   Smokeless tobacco: Never  Vaping Use   Vaping status: Never Used  Substance and Sexual Activity   Alcohol use: Not Currently   Drug use: No   Sexual activity: Yes  Other Topics Concern   Not on file  Social History Narrative   Not on file   Social Drivers of Health   Financial Resource Strain: Low Risk  (11/04/2023)   Overall Financial Resource Strain (CARDIA)    Difficulty of Paying Living Expenses: Not hard at all  Food Insecurity: No Food Insecurity (01/16/2024)   Hunger Vital Sign    Worried About Running Out of Food in the Last Year: Never true    Ran Out of Food in the Last Year: Never true  Transportation Needs: No Transportation Needs (01/16/2024)   PRAPARE - Administrator, Civil Service (Medical): No    Lack of Transportation (Non-Medical): No  Physical Activity: Inactive (11/04/2023)   Exercise Vital Sign    Days of Exercise per Week: 0 days    Minutes of Exercise per Session: 0 min  Stress: No Stress Concern Present (11/04/2023)   [employer] of Occupational Health - Occupational Stress Questionnaire    Feeling of Stress : Not at all  Social Connections: Socially Integrated (01/15/2024)   Social Connection and Isolation Panel [NHANES]    Frequency of Communication with Friends and Family: More than three times a week    Frequency  of Social Gatherings with Friends and Family: Twice a week    Attends Religious Services: More than 4 times per year    Active Member of [employer] or Organizations: Yes    Attends Engineer, structural: More than 4 times per year    Marital Status: Married  Catering manager Violence: Not At Risk (01/16/2024)   Humiliation, Afraid, Rape, and Kick questionnaire    Fear of Current or Ex-Partner: No    Emotionally Abused: No    Physically Abused: No    Sexually Abused: No      Review of Systems    All other ros negative  Objective:    BP 135/74   Pulse 60   Temp 98.1 F (36.7 C) (Oral)   Resp 16   SpO2 100%  Nursing note and vital signs reviewed.  Physical Exam     General/constitutional: no distress, pleasant, conversant HEENT: Normocephalic, PER, Conj Clear, EOMI, Oropharynx clear Neck supple CV: rrr no mrg Lungs: clear to auscultation, normal respiratory effort Abd: Soft, Nontender Ext: bilateral pedal edema; bilateral LE wrapped for legs swelling Skin: No Rash Neuro: nonfocal -- generalized weakness; here in wheel chair today 02/07/24 MSK: no peripheral joint swelling/tenderness/warmth; back spines nontender    Labs: Lab Results  Component Value Date   WBC 9.2 01/26/2024   HGB 8.4 (L) 01/26/2024   HCT 26.1 (L) 01/26/2024   MCV 83.1 01/26/2024   PLT 263 01/26/2024   Last metabolic panel Lab Results  Component Value Date   GLUCOSE 125 (H) 01/26/2024   NA 137 01/26/2024   K 3.1 (L) 01/26/2024   CL 109 01/26/2024   CO2  22 01/26/2024   BUN 25 (H) 01/26/2024   CREATININE 1.76 (H) 01/26/2024   GFRNONAA 39 (L) 01/26/2024   CALCIUM 8.0 (L) 01/26/2024   PHOS 2.9 01/25/2024   PROT 5.7 (L) 01/25/2024   ALBUMIN  2.4 (L) 01/25/2024   LABGLOB 2.4 06/29/2022   AGRATIO 1.8 06/29/2022   BILITOT 0.4 01/25/2024   ALKPHOS 49 01/25/2024   AST 19 01/25/2024   ALT 15 01/25/2024   ANIONGAP 6 01/26/2024    Micro:  Serology:  Imaging: Reviewed   01/20/24  tte FINDINGS   Left Ventricle: Left ventricular ejection fraction, by estimation, is 55  to 60%. Left ventricular ejection fraction by 2D MOD biplane is 55.3 %.  Left ventricular ejection fraction by 3D volume is 54 %. The left  ventricle has normal function. The left  ventricle has no regional wall motion abnormalities. The average left  ventricular global longitudinal strain is -13.2 %. Strain was performed  and the global longitudinal strain is abnormal. The left ventricular  internal cavity size was normal in size.  There is mild concentric left ventricular hypertrophy. Left ventricular  diastolic parameters are consistent with Grade I diastolic dysfunction  (impaired relaxation).   Right Ventricle: Peak RV-RA gradient 23 mmHg. The right ventricular size  is normal. No increase in right ventricular wall thickness. Right  ventricular systolic function is normal.   Left Atrium: Left atrial size was mild to moderately dilated.   Right Atrium: Right atrial size was normal in size.   Pericardium: There is no evidence of pericardial effusion.   Mitral Valve: The mitral valve is degenerative in appearance. There is  moderate calcification of the mitral valve leaflet(s). Moderate mitral  annular calcification. Trivial mitral valve regurgitation. Mild mitral  valve stenosis. MV peak gradient, 15.1  mmHg. The mean mitral valve gradient is 6.0 mmHg.   Tricuspid Valve: The tricuspid valve is normal in structure. Tricuspid  valve regurgitation is trivial.   Aortic Valve: Bioprosthetic aortic valve s/p TAVR with 29 mm Edwards  Sapien THV. Mean gradient 10 mmHg, EOA 2.73 cm^2. Dimensionless index 43.  Trivial perivalvular leakage. The aortic valve has been repaired/replaced.  Aortic valve regurgitation is  trivial. Aortic valve mean gradient measures 10.0 mmHg. Aortic valve peak  gradient measures 16.6 mmHg. Aortic valve area, by VTI measures 2.73 cm.  There is a 29 mm Sapien prosthetic,  stented (TAVR) valve present in the  aortic position.   Pulmonic Valve: The pulmonic valve was normal in structure. Pulmonic valve  regurgitation is trivial.   Aorta: The aortic root is normal in size and structure.   Venous: The IVC was not visualized. The inferior vena cava was not well  visualized.    3/30 ct chest abd pelv 1. No acute traumatic injury to the chest, abdomen or pelvis. 2. There is a new ill-defined heterogeneous hypoattenuating approximately 4.2 x 4.8 cm mass in the right hepatic lobe, segment 5/6, which is new since the prior study and highly concerning for neoplastic process. There is an additional smaller approximately 1 cm sized hypoattenuating structure in the right hepatic lobe, segment 8, which is also concerning for neoplastic process. Further evaluation with multiphasic contrast-enhanced MRI or CT scan abdomen as per liver mass protocol is recommended. 3. There is an irregular approximately 1.4 x 1.7 cm soft tissue attenuation nodule in the right upper quadrant abutting the right inferior liver surface, favored to represent metastatic deposit. 4. There is a subpleural solid noncalcified 6 x 7 mm nodule in  the left lung lower lobe, which is grossly unchanged since the prior study but new since the prior study from 12/18/2021. This is indeterminate. 5. Multiple other nonacute observations, as described above.    Assessment & Plan:   Problem List Items Addressed This Visit     DVT (deep venous thrombosis) (HCC)   Other Visit Diagnoses       Liver abscess    -  Primary   Relevant Orders   CBC   COMPLETE METABOLIC PANEL WITHOUT GFR   C-reactive protein   CT ABDOMEN PELVIS WO CONTRAST     Pulmonary nodules         Urinary retention             No orders of the defined types were placed in this encounter.    Antibiotics: Ceftriaxone  3/30-4/10  Metronidazole  4/1-c Cipro  4/1-c   Assessment 37 Y O male with multiple co-morbidities as  below including DM2, RA initially presented with weakness, recurrent falls and dark stools. Found to have hb 6 requiring transfusions, including liver lesions consistent with abscess.    # Liver abscess  4/2 s/p US  aspiration and cor biopsy - path consistent with reactive fibrosis and inflammation c/a abscess. Cx with E coli R to Unasyn; s cipro /bactrim/ceftriaxone   -started abx on 3/31; 3 weeks on it now -will continue with repeat ct planned for a couples weeks and f/u in around 4 weeks -labs today    # Anemia/GIB - s/p multiple transfusions  - 4/1 s/p EGD posterior oropharynx abnormal with congested mucosa and hematin, grade 1 esophageal varices noted proximally and in the middle of the esophagus.  No gross lesions in the distal esophagus.  Low-grade narrowing Schatzki's ring.  5 cm hiatal hernia.  Erythematous mucosa in the stomach, biopsied.  No evidence of portal gastropathy or varices of the stomach. Path suggestive of iron  pill gastritis, chronic peptic duodenitis   # AKI on CKD - in the setting of urinary retention - CT 4/8 with overdistended bladder with b/l hydronephrosis, left peripnephric stranding with involvement of proximal ureter  concerning for UTI but asssypmptomatic from gu standpoint (on ceftriaxone  regardless for hepatic abscess - on foley's -- failed voiding trial at SNF; will probably need f/u urology    # DVT  - unprovoked; concerning due to failure to thrive picture of a year and now with dvt's - started on [facility] in hospital - need f/u pcp and potentially discussion with hematology to trial off or keep anticoagulation given maybe unprovoked   # RA  - Enbrel  on hold in hospital due to active infection - f/u rheumatology - ok to restart from ID standpoint    # other issues needing pcp f/u Legs swelling Hyperemic/congested posterior pharyngeal area --needs ent f/u       Follow-up: Return in about 4 weeks (around 03/06/2024).      Jamesetta Mcbride,  MD [facility] 02/07/2024, 10:19 AM

## 2024-02-08 ENCOUNTER — Telehealth: Payer: Self-pay

## 2024-02-08 LAB — CBC
HCT: 25.3 % — ABNORMAL LOW (ref 38.5–50.0)
Hemoglobin: 8.1 g/dL — ABNORMAL LOW (ref 13.2–17.1)
MCH: 26.7 pg — ABNORMAL LOW (ref 27.0–33.0)
MCHC: 32 g/dL (ref 32.0–36.0)
MCV: 83.5 fL (ref 80.0–100.0)
MPV: 10.4 fL (ref 7.5–12.5)
Platelets: 253 10*3/uL (ref 140–400)
RBC: 3.03 10*6/uL — ABNORMAL LOW (ref 4.20–5.80)
RDW: 17.6 % — ABNORMAL HIGH (ref 11.0–15.0)
WBC: 10.1 10*3/uL (ref 3.8–10.8)

## 2024-02-08 LAB — COMPLETE METABOLIC PANEL WITHOUT GFR
AG Ratio: 1.4 (calc) (ref 1.0–2.5)
ALT: 9 U/L (ref 9–46)
AST: 19 U/L (ref 10–35)
Albumin: 3 g/dL — ABNORMAL LOW (ref 3.6–5.1)
Alkaline phosphatase (APISO): 54 U/L (ref 35–144)
BUN/Creatinine Ratio: 19 (calc) (ref 6–22)
BUN: 32 mg/dL — ABNORMAL HIGH (ref 7–25)
CO2: 21 mmol/L (ref 20–32)
Calcium: 8.2 mg/dL — ABNORMAL LOW (ref 8.6–10.3)
Chloride: 108 mmol/L (ref 98–110)
Creat: 1.7 mg/dL — ABNORMAL HIGH (ref 0.70–1.22)
Globulin: 2.2 g/dL (ref 1.9–3.7)
Glucose, Bld: 125 mg/dL — ABNORMAL HIGH (ref 65–99)
Potassium: 4.2 mmol/L (ref 3.5–5.3)
Sodium: 138 mmol/L (ref 135–146)
Total Bilirubin: 0.3 mg/dL (ref 0.2–1.2)
Total Protein: 5.2 g/dL — ABNORMAL LOW (ref 6.1–8.1)

## 2024-02-08 LAB — C-REACTIVE PROTEIN: CRP: 18.5 mg/L — ABNORMAL HIGH (ref <8.0)

## 2024-02-08 NOTE — Telephone Encounter (Addendum)
 The nurse the [facility] called to the report the patient LLE has become more swollen, red and warm as the week has progressed he remains afebrile. The Nurse Practitioner has assessed and would like  Dr. Shereen Dike insight.   Per Dr. Shereen Dike patient can start cephalaxin 500 mg BID for 10 days Verbal orders sent to [facility] via email and verbal orders given to the nurse Porfirio Bristol. Picture below  Ph [phone]

## 2024-02-08 NOTE — Telephone Encounter (Signed)
 Message sent to Dr.Wendling.

## 2024-02-13 ENCOUNTER — Ambulatory Visit: Admitting: Cardiology

## 2024-02-13 DIAGNOSIS — N401 Enlarged prostate with lower urinary tract symptoms: Secondary | ICD-10-CM | POA: Diagnosis not present

## 2024-02-13 DIAGNOSIS — R351 Nocturia: Secondary | ICD-10-CM | POA: Diagnosis not present

## 2024-02-13 DIAGNOSIS — R3912 Poor urinary stream: Secondary | ICD-10-CM | POA: Diagnosis not present

## 2024-02-13 DIAGNOSIS — R350 Frequency of micturition: Secondary | ICD-10-CM | POA: Diagnosis not present

## 2024-02-13 DIAGNOSIS — R338 Other retention of urine: Secondary | ICD-10-CM | POA: Diagnosis not present

## 2024-02-14 ENCOUNTER — Ambulatory Visit (HOSPITAL_COMMUNITY)
Admission: RE | Admit: 2024-02-14 | Discharge: 2024-02-14 | Disposition: A | Discharge status: Home / Self Care | Source: Ambulatory Visit | Attending: Internal Medicine | Admitting: Internal Medicine

## 2024-02-14 DIAGNOSIS — K449 Diaphragmatic hernia without obstruction or gangrene: Secondary | ICD-10-CM | POA: Diagnosis not present

## 2024-02-14 DIAGNOSIS — K769 Liver disease, unspecified: Secondary | ICD-10-CM | POA: Diagnosis not present

## 2024-02-14 DIAGNOSIS — K750 Abscess of liver: Secondary | ICD-10-CM | POA: Insufficient documentation

## 2024-02-14 DIAGNOSIS — N400 Enlarged prostate without lower urinary tract symptoms: Secondary | ICD-10-CM | POA: Diagnosis not present

## 2024-02-14 DIAGNOSIS — N1330 Unspecified hydronephrosis: Secondary | ICD-10-CM | POA: Diagnosis not present

## 2024-03-01 ENCOUNTER — Telehealth: Payer: Self-pay

## 2024-03-01 DIAGNOSIS — R600 Localized edema: Secondary | ICD-10-CM | POA: Diagnosis not present

## 2024-03-01 DIAGNOSIS — Z8719 Personal history of other diseases of the digestive system: Secondary | ICD-10-CM | POA: Diagnosis not present

## 2024-03-01 DIAGNOSIS — D649 Anemia, unspecified: Secondary | ICD-10-CM | POA: Diagnosis not present

## 2024-03-01 NOTE — Telephone Encounter (Signed)
 Pt's wife called in relation to automated appt reminder of upcoming appt on 03/08/2024. Pt is currently at [facility], but she stated that it is coordinated for him to d/c to another skilled facility just a couple days prior to his appt w/ us . I did confirm with her that I will reach out to [facility] for confirmation on this transition of care of the new facility and if the appt will be made aware for them to transport him to our office.   Attempted to call [facility] and was transferred to the healthcare dept due to the transition, but no answer and left a VM for a returned call.  [phone]

## 2024-03-02 ENCOUNTER — Encounter (HOSPITAL_COMMUNITY): Payer: Self-pay

## 2024-03-02 ENCOUNTER — Emergency Department (HOSPITAL_COMMUNITY)

## 2024-03-02 ENCOUNTER — Other Ambulatory Visit: Payer: Self-pay

## 2024-03-02 ENCOUNTER — Inpatient Hospital Stay (HOSPITAL_COMMUNITY)
Admission: EM | Admit: 2024-03-02 | Discharge: 2024-03-09 | DRG: 377 | Disposition: A | Discharge status: Skilled Nursing Facility | Source: Skilled Nursing Facility | Attending: Internal Medicine | Admitting: Internal Medicine

## 2024-03-02 DIAGNOSIS — Z7901 Long term (current) use of anticoagulants: Secondary | ICD-10-CM

## 2024-03-02 DIAGNOSIS — F324 Major depressive disorder, single episode, in partial remission: Secondary | ICD-10-CM

## 2024-03-02 DIAGNOSIS — N179 Acute kidney failure, unspecified: Secondary | ICD-10-CM | POA: Diagnosis present

## 2024-03-02 DIAGNOSIS — E1140 Type 2 diabetes mellitus with diabetic neuropathy, unspecified: Secondary | ICD-10-CM | POA: Diagnosis present

## 2024-03-02 DIAGNOSIS — Z8673 Personal history of transient ischemic attack (TIA), and cerebral infarction without residual deficits: Secondary | ICD-10-CM

## 2024-03-02 DIAGNOSIS — Z86718 Personal history of other venous thrombosis and embolism: Secondary | ICD-10-CM

## 2024-03-02 DIAGNOSIS — K750 Abscess of liver: Secondary | ICD-10-CM | POA: Diagnosis not present

## 2024-03-02 DIAGNOSIS — F0390 Unspecified dementia without behavioral disturbance: Secondary | ICD-10-CM | POA: Diagnosis present

## 2024-03-02 DIAGNOSIS — R195 Other fecal abnormalities: Secondary | ICD-10-CM

## 2024-03-02 DIAGNOSIS — M21372 Foot drop, left foot: Secondary | ICD-10-CM | POA: Diagnosis present

## 2024-03-02 DIAGNOSIS — S3991XA Unspecified injury of abdomen, initial encounter: Secondary | ICD-10-CM | POA: Diagnosis not present

## 2024-03-02 DIAGNOSIS — Z79899 Other long term (current) drug therapy: Secondary | ICD-10-CM

## 2024-03-02 DIAGNOSIS — N1832 Chronic kidney disease, stage 3b: Secondary | ICD-10-CM | POA: Diagnosis present

## 2024-03-02 DIAGNOSIS — N401 Enlarged prostate with lower urinary tract symptoms: Secondary | ICD-10-CM | POA: Diagnosis not present

## 2024-03-02 DIAGNOSIS — Z792 Long term (current) use of antibiotics: Secondary | ICD-10-CM

## 2024-03-02 DIAGNOSIS — Z83719 Family history of colon polyps, unspecified: Secondary | ICD-10-CM

## 2024-03-02 DIAGNOSIS — Z87891 Personal history of nicotine dependence: Secondary | ICD-10-CM

## 2024-03-02 DIAGNOSIS — D62 Acute posthemorrhagic anemia: Secondary | ICD-10-CM | POA: Diagnosis present

## 2024-03-02 DIAGNOSIS — D649 Anemia, unspecified: Secondary | ICD-10-CM | POA: Diagnosis not present

## 2024-03-02 DIAGNOSIS — Z981 Arthrodesis status: Secondary | ICD-10-CM

## 2024-03-02 DIAGNOSIS — Z952 Presence of prosthetic heart valve: Secondary | ICD-10-CM

## 2024-03-02 DIAGNOSIS — M069 Rheumatoid arthritis, unspecified: Secondary | ICD-10-CM | POA: Diagnosis present

## 2024-03-02 DIAGNOSIS — D1779 Benign lipomatous neoplasm of other sites: Secondary | ICD-10-CM | POA: Diagnosis present

## 2024-03-02 DIAGNOSIS — M7989 Other specified soft tissue disorders: Secondary | ICD-10-CM | POA: Diagnosis not present

## 2024-03-02 DIAGNOSIS — E11649 Type 2 diabetes mellitus with hypoglycemia without coma: Secondary | ICD-10-CM | POA: Diagnosis not present

## 2024-03-02 DIAGNOSIS — K529 Noninfective gastroenteritis and colitis, unspecified: Secondary | ICD-10-CM | POA: Diagnosis present

## 2024-03-02 DIAGNOSIS — Z66 Do not resuscitate: Secondary | ICD-10-CM | POA: Diagnosis present

## 2024-03-02 DIAGNOSIS — Z794 Long term (current) use of insulin: Secondary | ICD-10-CM

## 2024-03-02 DIAGNOSIS — R899 Unspecified abnormal finding in specimens from other organs, systems and tissues: Principal | ICD-10-CM

## 2024-03-02 DIAGNOSIS — T45525A Adverse effect of antithrombotic drugs, initial encounter: Secondary | ICD-10-CM | POA: Diagnosis present

## 2024-03-02 DIAGNOSIS — R296 Repeated falls: Secondary | ICD-10-CM | POA: Diagnosis present

## 2024-03-02 DIAGNOSIS — K5730 Diverticulosis of large intestine without perforation or abscess without bleeding: Secondary | ICD-10-CM | POA: Diagnosis not present

## 2024-03-02 DIAGNOSIS — G9389 Other specified disorders of brain: Secondary | ICD-10-CM | POA: Diagnosis not present

## 2024-03-02 DIAGNOSIS — R911 Solitary pulmonary nodule: Secondary | ICD-10-CM | POA: Diagnosis present

## 2024-03-02 DIAGNOSIS — Z7982 Long term (current) use of aspirin: Secondary | ICD-10-CM

## 2024-03-02 DIAGNOSIS — E1122 Type 2 diabetes mellitus with diabetic chronic kidney disease: Secondary | ICD-10-CM | POA: Diagnosis present

## 2024-03-02 DIAGNOSIS — I350 Nonrheumatic aortic (valve) stenosis: Secondary | ICD-10-CM | POA: Diagnosis present

## 2024-03-02 DIAGNOSIS — N183 Chronic kidney disease, stage 3 unspecified: Secondary | ICD-10-CM

## 2024-03-02 DIAGNOSIS — Z860100 Personal history of colon polyps, unspecified: Secondary | ICD-10-CM

## 2024-03-02 DIAGNOSIS — W19XXXA Unspecified fall, initial encounter: Secondary | ICD-10-CM | POA: Diagnosis present

## 2024-03-02 DIAGNOSIS — Z888 Allergy status to other drugs, medicaments and biological substances status: Secondary | ICD-10-CM

## 2024-03-02 DIAGNOSIS — D6832 Hemorrhagic disorder due to extrinsic circulating anticoagulants: Secondary | ICD-10-CM | POA: Diagnosis present

## 2024-03-02 DIAGNOSIS — D500 Iron deficiency anemia secondary to blood loss (chronic): Secondary | ICD-10-CM | POA: Diagnosis not present

## 2024-03-02 DIAGNOSIS — I8500 Esophageal varices without bleeding: Secondary | ICD-10-CM | POA: Diagnosis present

## 2024-03-02 DIAGNOSIS — K922 Gastrointestinal hemorrhage, unspecified: Secondary | ICD-10-CM | POA: Diagnosis present

## 2024-03-02 DIAGNOSIS — R351 Nocturia: Secondary | ICD-10-CM | POA: Diagnosis present

## 2024-03-02 DIAGNOSIS — I6782 Cerebral ischemia: Secondary | ICD-10-CM | POA: Diagnosis not present

## 2024-03-02 DIAGNOSIS — I130 Hypertensive heart and chronic kidney disease with heart failure and stage 1 through stage 4 chronic kidney disease, or unspecified chronic kidney disease: Secondary | ICD-10-CM | POA: Diagnosis present

## 2024-03-02 DIAGNOSIS — S0990XA Unspecified injury of head, initial encounter: Secondary | ICD-10-CM | POA: Diagnosis not present

## 2024-03-02 DIAGNOSIS — Z886 Allergy status to analgesic agent status: Secondary | ICD-10-CM

## 2024-03-02 DIAGNOSIS — I5033 Acute on chronic diastolic (congestive) heart failure: Secondary | ICD-10-CM | POA: Diagnosis present

## 2024-03-02 DIAGNOSIS — E16A1 Hypoglycemia level 1: Secondary | ICD-10-CM | POA: Diagnosis not present

## 2024-03-02 DIAGNOSIS — T45515A Adverse effect of anticoagulants, initial encounter: Secondary | ICD-10-CM | POA: Diagnosis present

## 2024-03-02 DIAGNOSIS — Z83438 Family history of other disorder of lipoprotein metabolism and other lipidemia: Secondary | ICD-10-CM

## 2024-03-02 DIAGNOSIS — S065XAA Traumatic subdural hemorrhage with loss of consciousness status unknown, initial encounter: Secondary | ICD-10-CM | POA: Diagnosis present

## 2024-03-02 DIAGNOSIS — S299XXA Unspecified injury of thorax, initial encounter: Secondary | ICD-10-CM | POA: Diagnosis not present

## 2024-03-02 DIAGNOSIS — K5521 Angiodysplasia of colon with hemorrhage: Secondary | ICD-10-CM | POA: Diagnosis not present

## 2024-03-02 DIAGNOSIS — E872 Acidosis, unspecified: Secondary | ICD-10-CM | POA: Diagnosis present

## 2024-03-02 DIAGNOSIS — I5031 Acute diastolic (congestive) heart failure: Secondary | ICD-10-CM

## 2024-03-02 DIAGNOSIS — I10 Essential (primary) hypertension: Secondary | ICD-10-CM | POA: Diagnosis present

## 2024-03-02 DIAGNOSIS — E8809 Other disorders of plasma-protein metabolism, not elsewhere classified: Secondary | ICD-10-CM | POA: Diagnosis present

## 2024-03-02 DIAGNOSIS — I82409 Acute embolism and thrombosis of unspecified deep veins of unspecified lower extremity: Secondary | ICD-10-CM | POA: Diagnosis present

## 2024-03-02 DIAGNOSIS — K219 Gastro-esophageal reflux disease without esophagitis: Secondary | ICD-10-CM | POA: Diagnosis present

## 2024-03-02 DIAGNOSIS — S3993XA Unspecified injury of pelvis, initial encounter: Secondary | ICD-10-CM | POA: Diagnosis not present

## 2024-03-02 DIAGNOSIS — E781 Pure hyperglyceridemia: Secondary | ICD-10-CM | POA: Diagnosis present

## 2024-03-02 DIAGNOSIS — K921 Melena: Secondary | ICD-10-CM | POA: Diagnosis not present

## 2024-03-02 DIAGNOSIS — E1169 Type 2 diabetes mellitus with other specified complication: Secondary | ICD-10-CM | POA: Diagnosis present

## 2024-03-02 HISTORY — DX: Family history of other specified conditions: Z84.89

## 2024-03-02 LAB — CBC WITH DIFFERENTIAL/PLATELET
Abs Immature Granulocytes: 0.04 10*3/uL (ref 0.00–0.07)
Basophils Absolute: 0.1 10*3/uL (ref 0.0–0.1)
Basophils Relative: 1 %
Eosinophils Absolute: 0.2 10*3/uL (ref 0.0–0.5)
Eosinophils Relative: 2 %
HCT: 20.5 % — ABNORMAL LOW (ref 39.0–52.0)
Hemoglobin: 6.4 g/dL — CL (ref 13.0–17.0)
Immature Granulocytes: 1 %
Lymphocytes Relative: 21 %
Lymphs Abs: 1.7 10*3/uL (ref 0.7–4.0)
MCH: 26.8 pg (ref 26.0–34.0)
MCHC: 31.2 g/dL (ref 30.0–36.0)
MCV: 85.8 fL (ref 80.0–100.0)
Monocytes Absolute: 0.5 10*3/uL (ref 0.1–1.0)
Monocytes Relative: 6 %
Neutro Abs: 5.6 10*3/uL (ref 1.7–7.7)
Neutrophils Relative %: 69 %
Platelets: 262 10*3/uL (ref 150–400)
RBC: 2.39 MIL/uL — ABNORMAL LOW (ref 4.22–5.81)
RDW: 19.2 % — ABNORMAL HIGH (ref 11.5–15.5)
WBC: 8.2 10*3/uL (ref 4.0–10.5)
nRBC: 0 % (ref 0.0–0.2)

## 2024-03-02 LAB — CBC
HCT: 21.6 % — ABNORMAL LOW (ref 39.0–52.0)
Hemoglobin: 7.1 g/dL — ABNORMAL LOW (ref 13.0–17.0)
MCH: 27.1 pg (ref 26.0–34.0)
MCHC: 32.9 g/dL (ref 30.0–36.0)
MCV: 82.4 fL (ref 80.0–100.0)
Platelets: 240 10*3/uL (ref 150–400)
RBC: 2.62 MIL/uL — ABNORMAL LOW (ref 4.22–5.81)
RDW: 18.2 % — ABNORMAL HIGH (ref 11.5–15.5)
WBC: 8.2 10*3/uL (ref 4.0–10.5)
nRBC: 0 % (ref 0.0–0.2)

## 2024-03-02 LAB — POC OCCULT BLOOD, ED: Fecal Occult Bld: POSITIVE — AB

## 2024-03-02 LAB — COMPREHENSIVE METABOLIC PANEL WITH GFR
ALT: 15 U/L (ref 0–44)
AST: 24 U/L (ref 15–41)
Albumin: 2.4 g/dL — ABNORMAL LOW (ref 3.5–5.0)
Alkaline Phosphatase: 51 U/L (ref 38–126)
Anion gap: 11 (ref 5–15)
BUN: 47 mg/dL — ABNORMAL HIGH (ref 8–23)
CO2: 16 mmol/L — ABNORMAL LOW (ref 22–32)
Calcium: 8 mg/dL — ABNORMAL LOW (ref 8.9–10.3)
Chloride: 112 mmol/L — ABNORMAL HIGH (ref 98–111)
Creatinine, Ser: 2.32 mg/dL — ABNORMAL HIGH (ref 0.61–1.24)
GFR, Estimated: 28 mL/min — ABNORMAL LOW (ref >60)
Glucose, Bld: 169 mg/dL — ABNORMAL HIGH (ref 70–99)
Potassium: 4.6 mmol/L (ref 3.5–5.1)
Sodium: 139 mmol/L (ref 135–145)
Total Bilirubin: 0.4 mg/dL (ref 0.0–1.2)
Total Protein: 5.3 g/dL — ABNORMAL LOW (ref 6.5–8.1)

## 2024-03-02 LAB — LIPASE, BLOOD: Lipase: 41 U/L (ref 11–51)

## 2024-03-02 LAB — PROTIME-INR
INR: 1.4 — ABNORMAL HIGH (ref 0.8–1.2)
Prothrombin Time: 17.8 s — ABNORMAL HIGH (ref 11.4–15.2)

## 2024-03-02 LAB — BRAIN NATRIURETIC PEPTIDE: B Natriuretic Peptide: 102.1 pg/mL — ABNORMAL HIGH (ref 0.0–100.0)

## 2024-03-02 LAB — GLUCOSE, CAPILLARY: Glucose-Capillary: 128 mg/dL — ABNORMAL HIGH (ref 70–99)

## 2024-03-02 LAB — PREPARE RBC (CROSSMATCH)

## 2024-03-02 MED ORDER — VITAMIN E 45 MG (100 UNIT) PO CAPS
400.0000 [IU] | ORAL_CAPSULE | Freq: Every day | ORAL | Status: DC
  Administered 2024-03-03 – 2024-03-09 (×7): 400 [IU] via ORAL
  Filled 2024-03-02 (×7): qty 4

## 2024-03-02 MED ORDER — FENOFIBRATE 160 MG PO TABS
160.0000 mg | ORAL_TABLET | Freq: Every day | ORAL | Status: DC
  Administered 2024-03-03 – 2024-03-09 (×7): 160 mg via ORAL
  Filled 2024-03-02 (×7): qty 1

## 2024-03-02 MED ORDER — INSULIN ASPART 100 UNIT/ML IJ SOLN
0.0000 [IU] | Freq: Every day | INTRAMUSCULAR | Status: DC
Start: 2024-03-02 — End: 2024-03-09
  Administered 2024-03-04 – 2024-03-06 (×3): 2 [IU] via SUBCUTANEOUS
  Administered 2024-03-07: 5 [IU] via SUBCUTANEOUS
  Administered 2024-03-08: 2 [IU] via SUBCUTANEOUS

## 2024-03-02 MED ORDER — FINASTERIDE 5 MG PO TABS
5.0000 mg | ORAL_TABLET | Freq: Every day | ORAL | Status: DC
  Administered 2024-03-03 – 2024-03-09 (×7): 5 mg via ORAL
  Filled 2024-03-02 (×7): qty 1

## 2024-03-02 MED ORDER — MAGNESIUM OXIDE -MG SUPPLEMENT 400 (240 MG) MG PO TABS
400.0000 mg | ORAL_TABLET | Freq: Every day | ORAL | Status: DC
  Administered 2024-03-03 – 2024-03-09 (×7): 400 mg via ORAL
  Filled 2024-03-02 (×7): qty 1

## 2024-03-02 MED ORDER — SODIUM CHLORIDE 0.9% IV SOLUTION: Freq: Once | INTRAVENOUS | Status: AC

## 2024-03-02 MED ORDER — ONDANSETRON HCL 4 MG/2ML IJ SOLN: 4.0000 mg | Freq: Four times a day (QID) | INTRAMUSCULAR | Status: DC | PRN

## 2024-03-02 MED ORDER — B COMPLEX-C PO TABS
1.0000 | ORAL_TABLET | Freq: Every day | ORAL | Status: DC
  Administered 2024-03-03 – 2024-03-09 (×7): 1 via ORAL
  Filled 2024-03-02 (×7): qty 1

## 2024-03-02 MED ORDER — ESCITALOPRAM OXALATE 10 MG PO TABS
20.0000 mg | ORAL_TABLET | Freq: Every day | ORAL | Status: DC
  Administered 2024-03-03 – 2024-03-09 (×7): 20 mg via ORAL
  Filled 2024-03-02 (×7): qty 2

## 2024-03-02 MED ORDER — VITAMIN B-12 1000 MCG PO TABS
1000.0000 ug | ORAL_TABLET | Freq: Every day | ORAL | Status: DC
  Administered 2024-03-03 – 2024-03-09 (×7): 1000 ug via ORAL
  Filled 2024-03-02 (×7): qty 1

## 2024-03-02 MED ORDER — METOPROLOL TARTRATE 12.5 MG HALF TABLET
12.5000 mg | ORAL_TABLET | Freq: Two times a day (BID) | ORAL | Status: DC
  Administered 2024-03-02 – 2024-03-09 (×13): 12.5 mg via ORAL
  Filled 2024-03-02 (×13): qty 1

## 2024-03-02 MED ORDER — PROTHROMBIN COMPLEX CONC HUMAN 500 UNITS IV KIT
2790.0000 [IU] | PACK | Status: AC
  Administered 2024-03-02: 2790 [IU] via INTRAVENOUS
  Filled 2024-03-02: qty 2790

## 2024-03-02 MED ORDER — MELATONIN 5 MG PO TABS
5.0000 mg | ORAL_TABLET | Freq: Every day | ORAL | Status: DC
  Administered 2024-03-02 – 2024-03-08 (×7): 5 mg via ORAL
  Filled 2024-03-02 (×7): qty 1

## 2024-03-02 MED ORDER — PANTOPRAZOLE SODIUM 40 MG IV SOLR
40.0000 mg | Freq: Two times a day (BID) | INTRAVENOUS | Status: DC
  Administered 2024-03-02 – 2024-03-05 (×7): 40 mg via INTRAVENOUS
  Filled 2024-03-02 (×8): qty 10

## 2024-03-02 MED ORDER — VITAMIN D 25 MCG (1000 UNIT) PO TABS
2000.0000 [IU] | ORAL_TABLET | Freq: Every day | ORAL | Status: DC
  Administered 2024-03-03 – 2024-03-09 (×7): 2000 [IU] via ORAL
  Filled 2024-03-02 (×7): qty 2

## 2024-03-02 MED ORDER — ASPIRIN 81 MG PO TBEC
81.0000 mg | DELAYED_RELEASE_TABLET | Freq: Every day | ORAL | Status: DC
  Administered 2024-03-03 – 2024-03-09 (×7): 81 mg via ORAL
  Filled 2024-03-02 (×7): qty 1

## 2024-03-02 MED ORDER — INSULIN ASPART 100 UNIT/ML IJ SOLN
0.0000 [IU] | Freq: Three times a day (TID) | INTRAMUSCULAR | Status: DC
  Administered 2024-03-03: 1 [IU] via SUBCUTANEOUS
  Administered 2024-03-04: 3 [IU] via SUBCUTANEOUS
  Administered 2024-03-04 – 2024-03-05 (×2): 1 [IU] via SUBCUTANEOUS
  Administered 2024-03-06: 3 [IU] via SUBCUTANEOUS
  Administered 2024-03-07: 1 [IU] via SUBCUTANEOUS
  Administered 2024-03-07 – 2024-03-08 (×3): 2 [IU] via SUBCUTANEOUS
  Administered 2024-03-08: 1 [IU] via SUBCUTANEOUS
  Administered 2024-03-08: 5 [IU] via SUBCUTANEOUS
  Administered 2024-03-09: 1 [IU] via SUBCUTANEOUS
  Administered 2024-03-09: 3 [IU] via SUBCUTANEOUS

## 2024-03-02 MED ORDER — ONDANSETRON HCL 4 MG PO TABS
4.0000 mg | ORAL_TABLET | Freq: Four times a day (QID) | ORAL | Status: DC | PRN
Start: 2024-03-02 — End: 2024-03-09

## 2024-03-02 MED ORDER — FUROSEMIDE 10 MG/ML IJ SOLN
20.0000 mg | Freq: Once | INTRAMUSCULAR | Status: AC
  Administered 2024-03-02: 20 mg via INTRAVENOUS
  Filled 2024-03-02: qty 2

## 2024-03-02 MED ORDER — CIPROFLOXACIN HCL 500 MG PO TABS
500.0000 mg | ORAL_TABLET | Freq: Two times a day (BID) | ORAL | Status: DC
  Administered 2024-03-02 – 2024-03-09 (×14): 500 mg via ORAL
  Filled 2024-03-02 (×18): qty 1

## 2024-03-02 MED ORDER — ALBUTEROL SULFATE (2.5 MG/3ML) 0.083% IN NEBU: 2.5000 mg | INHALATION_SOLUTION | RESPIRATORY_TRACT | Status: DC | PRN

## 2024-03-02 MED ORDER — METRONIDAZOLE 500 MG PO TABS
500.0000 mg | ORAL_TABLET | Freq: Three times a day (TID) | ORAL | Status: DC
  Administered 2024-03-02 – 2024-03-09 (×20): 500 mg via ORAL
  Filled 2024-03-02 (×20): qty 1

## 2024-03-02 MED ORDER — TRAZODONE HCL 50 MG PO TABS
25.0000 mg | ORAL_TABLET | Freq: Every evening | ORAL | Status: DC
Start: 2024-03-03 — End: 2024-03-09
  Administered 2024-03-03 – 2024-03-08 (×6): 25 mg via ORAL
  Filled 2024-03-02 (×6): qty 1

## 2024-03-02 MED ORDER — ACETAMINOPHEN 325 MG PO TABS
650.0000 mg | ORAL_TABLET | Freq: Four times a day (QID) | ORAL | Status: DC | PRN
  Administered 2024-03-02 – 2024-03-04 (×4): 650 mg via ORAL
  Filled 2024-03-02 (×4): qty 2

## 2024-03-02 MED ORDER — LEFLUNOMIDE 10 MG PO TABS
10.0000 mg | ORAL_TABLET | Freq: Every day | ORAL | Status: DC
  Administered 2024-03-03 – 2024-03-09 (×7): 10 mg via ORAL
  Filled 2024-03-02 (×7): qty 1

## 2024-03-02 MED ORDER — OMEGA-3-ACID ETHYL ESTERS 1 G PO CAPS
2000.0000 mg | ORAL_CAPSULE | Freq: Every day | ORAL | Status: DC
  Administered 2024-03-03 – 2024-03-09 (×7): 2000 mg via ORAL
  Filled 2024-03-02 (×7): qty 2

## 2024-03-02 MED ORDER — EZETIMIBE 10 MG PO TABS
10.0000 mg | ORAL_TABLET | Freq: Every day | ORAL | Status: DC
  Administered 2024-03-03 – 2024-03-09 (×7): 10 mg via ORAL
  Filled 2024-03-02 (×7): qty 1

## 2024-03-02 MED ORDER — INSULIN DEGLUDEC 100 UNIT/ML ~~LOC~~ SOPN: 20.0000 [IU] | PEN_INJECTOR | Freq: Every day | SUBCUTANEOUS | Status: DC

## 2024-03-02 NOTE — H&P (Addendum)
 History and Physical    DOA: 03/02/2024  PCP: Jobe Mulder, DO  Patient coming from: SNF  Chief Complaint: Fall, melena and abnormal labs  HPI: Tommy Green is a 78 y.o. male with history h/o HTN, HLP, GERD, colon polyps, hiatal hernia, rheumatoid arthritis, CVA, CKD stage IIIb, aortic stenosis s/p TAVR, recurrent falls with history of subdural hematoma in the past, DVT of right upper extremity on Eliquis , recent admission for recurrent falls at home and workup revealing hepatic abscess as well as acute on chronic anemia-seen at that time by GI--Underwent EGD 01/17/24 and noted to have grade I esophageal varices proximally and in the middle of the esophagus. No evidence of portal gastropathy or varices of the stomach.  Colonoscopy was not repeated as he had had one in November 2021 with findings of only a couple small polyps, diverticulosis and internal hemorrhoids.  During that admission a liver lesion was found on CT scan.  Lesion was worrisome for malignancy-later underwent liver biopsy indicating liver absents-> he is on chronic antibiotics and followed by ID as outpatient.  Patient was discharged to SNF for rehab at that time and per wife he has not been able to walk manage and considering transitioning to long-term nursing facility now.  Wife reports that he has developed bilateral leg swellings for which he has been receiving compression wraps at the nursing facility.   Patient apparently had a recurrent fall 3 days back which he does not recollect.  He denies any pain except for pain in the left thigh when he tries to bear weight.  Per wife patient's memory has also been declining slowly and he was noted to be somewhat delirious couple of days back which has now improved.  Patient was sent here given declining status, noted to have dark stools and as lab work revealed acute on chronic anemia.  Patient denies any hematochezia, hematemesis or shortness of breath or cough or phlegm. ED  course: Hemoglobin down from 8.1 in April->6.4 now.  MCV 85, platelet normal.  Sodium 139, potassium 4.6, BUN 47, creatinine 2.32 (baseline 1.7), bicarb 16, calcium 8.0, albumin  2.4, BNP 102.  Rectal exam in the ED resulted guaiac positive.  Patient received Kcentra in the ED and initiated on trauma workup including CT head which is now resulted unremarkable.  GI was consulted and admission requested for further evaluation and management.  Review of Systems: As per HPI, otherwise review of systems negative.    Past Medical History:  Diagnosis Date   Anemia    Essential hypertension 07/18/2017   Foot drop, left foot    due to back surgery in 01/2021   GERD (gastroesophageal reflux disease)    History of colonic polyps    History of hiatal hernia    many years ago   History of rheumatic fever    History of stroke 07/18/2017   Hypertriglyceridemia 07/18/2017   S/P TAVR (transcatheter aortic valve replacement) 01/26/2022   Edwards S3UR 29mm via TF approach with Dr. Lorie Rook and Dr. Sherene Dilling   Severe aortic stenosis    Type 2 diabetes mellitus with diabetic neuropathy, unspecified (HCC) 07/18/2017    Past Surgical History:  Procedure Laterality Date   COLONOSCOPY     around 2018 [facility]   COLONOSCOPY  09/02/2020   CRANIOTOMY Left 10/25/2019   Procedure: CRANIOTOMY HEMATOMA EVACUATION SUBDURAL;  Surgeon: Van Gelinas, MD;  Location: [facility] OR;  Service: Neurosurgery;  Laterality: Left;   ESOPHAGOGASTRODUODENOSCOPY  around 2018 with [facility]   ESOPHAGOGASTRODUODENOSCOPY N/A 01/17/2024   Procedure: EGD (ESOPHAGOGASTRODUODENOSCOPY);  Surgeon: Normie Becton., MD;  Location: [facility];  Service: Gastroenterology;  Laterality: N/A;   FOOT CAPSULOTOMY Left 07/25/2008   Mid Foot #2 MPJ   Hammertoe Repair Left 07/25/2008   #2 toe   HAND SURGERY     nodule removed from left thumb and right index finger, Dr. Claudius Cumins   INTRAOPERATIVE TRANSTHORACIC ECHOCARDIOGRAM N/A  01/26/2022   Procedure: INTRAOPERATIVE TRANSTHORACIC ECHOCARDIOGRAM;  Surgeon: Kyra Phy, MD;  Location: [facility] OR;  Service: Open Heart Surgery;  Laterality: N/A;   MOHS SURGERY  01/25/2023   right arm   RIGHT/LEFT HEART CATH AND CORONARY ANGIOGRAPHY N/A 12/11/2021   Procedure: RIGHT/LEFT HEART CATH AND CORONARY ANGIOGRAPHY;  Surgeon: Lucendia Rusk, MD;  Location: [facility] INVASIVE CV LAB;  Service: Cardiovascular;  Laterality: N/A;   SPINAL FUSION  01/2021   TARSAL TUNNEL RELEASE Left 07/25/2008   TONSILLECTOMY     removed as a child   TRANSCATHETER AORTIC VALVE REPLACEMENT, TRANSFEMORAL N/A 01/26/2022   Procedure: Transcatheter Aortic Valve Replacement , Transfemoral;  Surgeon: Kyra Phy, MD;  Location: [facility] OR;  Service: Open Heart Surgery;  Laterality: N/A;  Percutaneous   UPPER GASTROINTESTINAL ENDOSCOPY  09/02/2020    Social history:  reports that he quit smoking about 52 years ago. His smoking use included cigarettes. He started smoking about 62 years ago. He has a 10 pack-year smoking history. He has been exposed to tobacco smoke. He has never used smokeless tobacco. He reports that he does not currently use alcohol. He reports that he does not use drugs.   Allergies  Allergen Reactions   Canagliflozin Other (See Comments)    Pt reports that the use of Invokana caused acute KIDNEY FAILURE ([facility] records) and "allergic," per [facility]   Aspirin  Other (See Comments)    Gastroesophageal reflux and "allergic," per [facility]    Atorvastatin Other (See Comments)    Myalgias, GI upset, and "allergic," per [facility]   Statins Other (See Comments)    Muscle pain- "allergic," per [facility]    Family History  Problem Relation Age of Onset   Hyperlipidemia Mother    Colon polyps Mother    Hyperlipidemia Father    Alcoholism Brother    Healthy Son    Healthy Son    Cancer Neg Hx    Colon cancer Neg Hx    Esophageal cancer Neg Hx    Rectal cancer Neg Hx    Stomach cancer Neg Hx        Prior to Admission medications   Medication Sig Start Date End Date Taking? Authorizing Provider  apixaban  (ELIQUIS ) 5 MG TABS tablet Take 1 tablet (5 mg total) by mouth 2 (two) times daily. 01/26/24  Yes Faith Homes, MD  acetaminophen  (TYLENOL ) 325 MG tablet Take 650 mg by mouth every 6 (six) hours as needed for mild pain or headache.    [provider]  amLODipine  (NORVASC ) 10 MG tablet Take 1 tablet (10 mg total) by mouth daily. 01/27/24   Faith Homes, MD  aspirin  EC 81 MG tablet Take 1 tablet (81 mg total) by mouth daily. Swallow whole. Patient not taking: Reported on 02/07/2024 01/26/23   McDaniel, Jill D, NP  B Complex Vitamins (B COMPLEX PO) Take 1 tablet by mouth daily.    [provider]  Cholecalciferol (VITAMIN D3 SUPER STRENGTH) 50 MCG (2000 UT) CAPS Take 2,000 Units by mouth daily.  [provider]  escitalopram  (LEXAPRO ) 20 MG tablet Take 1 tablet (20 mg total) by mouth daily. 08/29/23   Jobe Mulder, DO  etanercept  (ENBREL  MINI) 50 MG/ML injection Inject 1 mL (50 mg total) into the skin once a week. 11/03/23   Romayne Clubs, PA-C  ezetimibe  (ZETIA ) 10 MG tablet Take 1 tablet (10 mg total) by mouth daily. 11/08/23   Jobe Mulder, DO  fenofibrate  micronized (LOFIBRA) 200 MG capsule TAKE 1 CAPSULE DAILY BEFOREBREAKFAST 06/17/23   Jobe Mulder, DO  finasteride  (PROSCAR ) 5 MG tablet Take 1 tablet (5 mg total) by mouth daily. 02/07/23   Scarlet Curly, MD  fluticasone  (FLONASE ) 50 MCG/ACT nasal spray Place 2 sprays into both nostrils daily. 06/16/22   Jobe Mulder, DO  insulin  aspart (NOVOLOG  FLEXPEN) 100 UNIT/ML FlexPen Inject 0-20 Units into the skin See admin instructions. Inject 0-10 units into the skin before meals, PER SLIDING SCALE: BGL 0-200 = give nothing, 201-250 = 2 units, 251-300 = 4 units, 301-350 = 6 units, 351-400 = 8 units, >400 = 10 units, push fluids, and repeat BGL check in 2 hours. If BGL  remains >400 after 2 hours, CALL MD.    [provider]  insulin  degludec (TRESIBA  FLEXTOUCH) 100 UNIT/ML FlexTouch Pen Inject 20 Units into the skin daily.    [provider]  leflunomide  (ARAVA ) 10 MG tablet Take 1 tablet (10 mg total) by mouth daily. 01/30/24   Romayne Clubs, PA-C  lisinopril  (ZESTRIL ) 40 MG tablet Take 1 tablet (40 mg total) by mouth in the morning. 10/05/23   Jobe Mulder, DO  magnesium  oxide (MAG-OX) 400 MG tablet Take 400 mg by mouth daily with lunch.    [provider]  melatonin 5 MG TABS Take 5 mg by mouth at bedtime.    [provider]  metoprolol  tartrate (LOPRESSOR ) 25 MG tablet Take 0.5 tablets (12.5 mg total) by mouth 2 (two) times daily. 01/26/24   Faith Homes, MD  Multiple Vitamins-Minerals (MULTIVITAMIN WITH MINERALS) tablet Take 1 tablet by mouth daily with breakfast.    [provider]  Omega 3 1000 MG CAPS Take 2,000 mg by mouth daily with lunch.    [provider]  omeprazole  (PRILOSEC) 20 MG capsule Take 1 capsule (20 mg total) by mouth daily. 06/08/23   Jobe Mulder, DO  [facility] VERIO test strip 1 each by Other route as needed for other (Glucose). 06/14/20   [provider]  Potassium 99 MG TABS Take 99 mg by mouth every evening. Patient not taking: Reported on 02/07/2024    [provider]  potassium citrate (UROCIT-K) 10 MEQ (1080 MG) SR tablet Take 10 mEq by mouth 2 (two) times daily.    [provider]  traZODone  (DESYREL ) 50 MG tablet Take 0.5 tablets (25 mg total) by mouth every evening. Give around 8pm 01/26/24   Faith Homes, MD  vitamin B-12 (CYANOCOBALAMIN ) 1000 MCG tablet Take 1,000 mcg by mouth daily.    [provider]  vitamin E 180 MG (400 UNITS) capsule Take 400 Units by mouth daily.    [provider]    Physical Exam: Vitals:   03/02/24 1629 03/02/24 1635 03/02/24 1650 03/02/24 1750  BP: (!) 136/105 (!) 121/46 (!)  102/49 101/64  Pulse: 68  70 74  Resp: 17  18 18   Temp: 98 F (36.7 C)  98 F (36.7 C) 97.8 F (36.6 C)  TempSrc:   Oral  Oral  SpO2: 100%  100% 100%    Constitutional: NAD, calm, comfortable Eyes: PERRL, lids and conjunctivae normal ENMT: Mucous membranes are moist. Posterior pharynx clear of any exudate or lesions.Normal dentition.  Neck: normal, supple, no masses, no thyromegaly Respiratory: clear to auscultation bilaterally, no wheezing, no crackles. Normal respiratory effort. No accessory muscle use.  Cardiovascular: Regular rate and rhythm, 1+ systolic aortic ejection murmur.2+ bilateral lower extremity edema. 2+ pedal pulses. No carotid bruits.  Abdomen: no tenderness, no masses palpated. No hepatosplenomegaly. Bowel sounds positive.  Musculoskeletal: Limited ROM along left lower extremity flexion-patient complained of pain with mild calf tenderness, no contractures. Normal muscle tone.  Neurologic: CN 2-12 grossly intact. Sensation intact, DTR normal. Strength 5/5 in all 4.  Psychiatric: Normal judgment and insight. Alert and oriented x 3. Normal mood.  SKIN/catheters: Superficial abrasions with healing scars in bilateral lower extremities.  No open wounds  Labs on Admission: I have personally reviewed following labs and imaging studies  CBC: Recent Labs  Lab 03/02/24 1354  WBC 8.2  NEUTROABS 5.6  HGB 6.4*  HCT 20.5*  MCV 85.8  PLT 262   Basic Metabolic Panel: Recent Labs  Lab 03/02/24 1354  NA 139  K 4.6  CL 112*  CO2 16*  GLUCOSE 169*  BUN 47*  CREATININE 2.32*  CALCIUM 8.0*   GFR: CrCl cannot be calculated (Unknown ideal weight.). Recent Labs  Lab 03/02/24 1354  WBC 8.2   Liver Function Tests: Recent Labs  Lab 03/02/24 1354  AST 24  ALT 15  ALKPHOS 51  BILITOT 0.4  PROT 5.3*  ALBUMIN  2.4*   Recent Labs  Lab 03/02/24 1354  LIPASE 41   No results for input(s): "AMMONIA" in the last 168 hours. Coagulation Profile: Recent Labs  Lab  03/02/24 1354  INR 1.4*   Cardiac Enzymes: No results for input(s): "CKTOTAL", "CKMB", "CKMBINDEX", "TROPONINI" in the last 168 hours. BNP (last 3 results) No results for input(s): "PROBNP" in the last 8760 hours. HbA1C: No results for input(s): "HGBA1C" in the last 72 hours. CBG: No results for input(s): "GLUCAP" in the last 168 hours. Lipid Profile: No results for input(s): "CHOL", "HDL", "LDLCALC", "TRIG", "CHOLHDL", "LDLDIRECT" in the last 72 hours. Thyroid Function Tests: No results for input(s): "TSH", "T4TOTAL", "FREET4", "T3FREE", "THYROIDAB" in the last 72 hours. Anemia Panel: No results for input(s): "VITAMINB12", "FOLATE", "FERRITIN", "TIBC", "IRON ", "RETICCTPCT" in the last 72 hours. Urine analysis:    Component Value Date/Time   COLORURINE YELLOW 01/23/2024 1527   APPEARANCEUR HAZY (A) 01/23/2024 1527   APPEARANCEUR Clear 06/01/2023 1114   LABSPEC 1.010 01/23/2024 1527   PHURINE 5.0 01/23/2024 1527   GLUCOSEU 150 (A) 01/23/2024 1527   HGBUR SMALL (A) 01/23/2024 1527   BILIRUBINUR NEGATIVE 01/23/2024 1527   BILIRUBINUR Negative 01/11/2024 1150   BILIRUBINUR Negative 06/01/2023 1114   KETONESUR NEGATIVE 01/23/2024 1527   PROTEINUR 30 (A) 01/23/2024 1527   UROBILINOGEN 0.2 01/11/2024 1150   NITRITE NEGATIVE 01/23/2024 1527   LEUKOCYTESUR SMALL (A) 01/23/2024 1527    Radiological Exams on Admission: Personally reviewed  CT HEAD WO CONTRAST ( ) Result Date: 03/02/2024 CLINICAL DATA:  Head trauma abnormal mental status EXAM: CT HEAD WITHOUT CONTRAST TECHNIQUE: Contiguous axial images were obtained from the base of the skull through the vertex without intravenous contrast. RADIATION DOSE REDUCTION: This exam was performed according to the departmental dose-optimization program which includes automated exposure control, adjustment of the mA and/or kV according to patient size and/or use of iterative  reconstruction technique. COMPARISON:  CT brain 01/15/2024, 12/09/2021,  MRI 03/06/2021 FINDINGS: Brain: No acute territorial infarction, hemorrhage, or intracranial mass. Chronic encephalomalacia at the inferolateral left frontal lobe. Chronic left dural thickening. Stable ventricle size. Atrophy and chronic small vessel ischemic changes of the white matter Vascular: No hyperdense vessels.  Carotid vascular calcification Skull: Left-sided craniotomy.  No fracture Sinuses/Orbits: No acute finding. Other: None IMPRESSION: 1. No CT evidence for acute intracranial abnormality. 2. Atrophy and chronic small vessel ischemic changes of the white matter. Chronic encephalomalacia at the inferolateral left frontal lobe. Electronically Signed   By: Esmeralda Hedge M.D.   On: 03/02/2024 18:43    EKG: Independently reviewed.  Sinus rhythm with QTc 497 ms.     Assessment and Plan:   Principal Problem:   GI bleed Active Problems:   AKI (acute kidney injury) (HCC)   Hepatic abscess   DVT (deep venous thrombosis) (HCC)   Essential hypertension   Hyperlipidemia associated with type 2 diabetes mellitus (HCC)   History of stroke   SDH (subdural hematoma) (HCC)   Gastro-esophageal reflux disease without esophagitis   Stage 3 chronic kidney disease (HCC)   Severe aortic stenosis   S/P TAVR (transcatheter aortic valve replacement)   BPH associated with nocturia   Heme positive stool   Acute on chronic anemia   GIB (gastrointestinal bleeding)    1.Acute on chronic anemia, concern for upper GI bleed: Known history of varices.  Per wife patient was noted to have melena this morning by nursing staff-normal color stool but guaiac positive in the ED.  Kcentra given for reversal of anticoagulation due to acute and severe anemia.  Hold anticoagulation for now.  Will resume baby aspirin .  IV PPI, GI evaluation.  H&H every 8 hours.  Unlikely to repeat EGD given recent findings of varices.  Defer plan for colonoscopy to GI.  2.  AKI on CKD stage IIIb: Could be prerenal (as per nursing home  records, patient been on Lasix  20 mg twice daily for bilateral leg swellings) versus congested kidney.  Will give 1 dose of IV Lasix  between transfusions and hold oral Lasix  for now.  Repeat labs after blood transfusion.  Can consider ginger fluids after transfusion if no improvement.    3.  Recent fall: Trauma workup ordered-CT head unremarkable currently.  CT abdomen pelvis to rule out retroperitoneal hematoma in the setting of fall, acute drop in hemoglobin is pending.   4.  Hypertension: Blood pressure appears to be soft and systolic low 100s.  Given recent fall and possible orthostatic hypotension in the setting of problem #1 as well as AKI, will hold amlodipine  and losartan.  Will resume metoprolol  and monitor for now.  5.  Hepatic abscess: Per rehab facility record patient on Cipro  and Flagyl  (start date April 22-intended for 1 month course), followed by ID as outpatient and was supposed to follow-up on Monday -May19.  Will order follow-up EKG for monitoring QT interval while on fluoroquinolones (today's EKG shows 497 ms).  6.  Type 2 diabetes mellitus with neuropathy without long-term use of insulin : Patient on long-acting and short-acting insulin  at home.  Will hold for now and manage with sliding scale insulin  while on clear liquid diet.  Resume home regimen when back on regular diet.  7.  History of CVA: Resume aspirin  and antihyperlipidemic agents.  Delirium precautions as per wife patient has been confused since last fall-she is relieved that CT head is unremarkable given prior history of SDH.  He may have mild cognitive impairment.  He is currently awake alert and oriented x 2.  When asked about his home address he mentioned that was of a place he lived 20 years prior but could not recollect where he and his wife currently live.   8.  Bilateral lower extremity swellings: Could be related to hypoalbuminemia versus amlodipine  that patient was on previously but currently off.  Recent echo from  April showed preserved EF.  BNP unremarkable currently.  Noted that patient been on p.o. Lasix  twice daily at the nursing facility-will hold for now given concern for renal insufficiency.  Rule out DVT as mentioned below.  TED hose until DVT ruled out.  Decision to resume Lasix  and anticoagulation could be based on BMP and Doppler lower extremity results in AM.  9. History of DVT: According to the wife patient does not have a history of A-fib and that he was started on Eliquis  as outpatient for right upper extremity DVT a while back.  She denies any history of pulmonary embolism or lower extremity DVT but currently concerned about bilateral lower extremity swellings.  Patient also complaining of left thigh pain.  We will hold anticoagulation for now given concerns for problem #1 and obtain right upper extremity Doppler to rule out ongoing DVT as well as bilateral lower extremity Doppler to rule out new DVT.  If negative patient can likely come off anticoagulation unless alternate indication found in the record.  10. Severe aortic stenosis s/p TAVR-followed by cardiology and per their last note in April 2024 there is no mention of atrial fibrillation diagnosis ((although listed as indication for Eliquis  and nursing home records)  11.  Rheumatoid arthritis: Resume home medications.  12. Vitamin B12/Vitamin D  deficiency: Resume home supplements.  13.  BPH resume home medications  DVT prophylaxis: Holding anticoagulation and concern for problem #1, hold SCDs until DVT ruled out.  TED hose for now.    Code Status:   DNR as confirmed with patient and wife. yellow form in the room from SNF.  health care proxy would be his wife  Patient/Family Communication: Discussed with patient and all questions answered to satisfaction.  Consults called: GI Admission status :Patient will be admitted under OBSERVATION status.The patient's presenting symptoms, physical exam findings, and initial radiographic and  laboratory data in the context of their medical condition is felt to place them at low risk for further clinical deterioration. Furthermore, it is anticipated that the patient will be medically stable for discharge from the hospital within 2 midnights of hospital stay.       Michela Aguas MD [facility] Pager in [place]  If 7PM-7AM, please contact night-coverage [email or web address]   03/02/2024, 6:46 PM

## 2024-03-02 NOTE — Consult Note (Signed)
 Consultation Note   Referring Provider:  Emergency Services PCP: Jobe Mulder, DO Primary Gastroenterologist:   Harry Lindau, MD Reason for Consultation:  Anemia, FOBT+ DOA: 03/02/2024         Hospital Day: 1   ASSESSMENT    78 year old male with acute on chronic [place] anemia / FOBT + on Eliquis .  Inpatient EGD by us  in April 2025 (for anemia) showed small varices in proximal and mid esophagus but no portal gastropathy to suggest portal HTN. Colonoscopy wasn't pursued that admission .   Liver abscess ( biopsy 01/18/24). There was originally a concern for malignant mass.   Hx of DVT, on Eliquis  Date of last dose unknown.   Lung nodule Per CT scan 3/30. Plan was for outpatient follow up  RA, on Embrel  See PMH for additional history  Active Problems:   * No active hospital problems. *     PLAN:   --Will consider proceeding with colonoscopy, especially given need for Eliquis . Will discuss with wife tomorrow. Not sure EGD needs to be repeat.  --Eliquis  is on hold --Trend H/H. Blood transfusion in progress --Clear liquid diet   HPI   78 y.o. year old male with multiple medical problems not limited to GERD, Schtzki's ring, diverticulosis, adenomatous colon polyps, RA, CKD 3, CVA, AS s/p TAVR in 2023, melanoma, DM, esophageal varices ( unclear cause)  Brief GI history  patient seen by us  in hospital late March 2025 for acute on chronic, severe IDA. Underwent EGD 01/17/24 for IDA.  Interestingly he had grade I esophageal varices proximally and in the middle of the esophagus. No evidence of portal gastropathy or varices of the stomach.  Colonoscopy was not repeated as he had had one in November 2021 with findings of only a couple small polyps, diverticulosis and internal hemorrhoids.  During his April admission a liver lesion was found on CT scan.  Lesion was worrisome for malignancy.  Interval history Patient brought from  rehab to ED today for low hemoglobin.  He has some confusion so details are not reliable.  He denies having any blood in his stool or dark stools.  According to the outpatient med list he is on multiple medications including aspirin  and Eliquis  but he cannot tell me timing of last Eliquis .  He denies abdominal pain.  No nausea nor vomiting.  His hemoglobin is down nearly 2 g since late April. AFP was normal.  MRI suggested hepatic abscess though cystic malignancy was not excluded.  He underwent a liver biopsy which showed abscess.  He is being followed by ID outpatient  Relevant workup thus far: Hemoglobin 6.4, down from 8.1 late April.  MCV 85, normal platelets, BUN 47, creatinine 2.32, albumin  2.4, LFTs otherwise normal  Labs and Imaging:  Recent Labs    03/02/24 1354  PROT 5.3*  ALBUMIN  2.4*  AST 24  ALT 15  ALKPHOS 51  BILITOT 0.4   Recent Labs    03/02/24 1354  WBC 8.2  HGB 6.4*  HCT 20.5*  MCV 85.8  PLT 262   Recent Labs    03/02/24 1354  NA 139  K 4.6  CL 112*  CO2 16*  GLUCOSE 169*  BUN 47*  CREATININE 2.32*  CALCIUM 8.0*     CT ABDOMEN PELVIS WO CONTRAST EXAMINATION: CT ABDOMEN PELVIS WO CONTRAST  CLINICAL INDICATION: Male, 78 years old. f/u liver abscesses.  TECHNIQUE: Helical CT scan examination of the abdomen and pelvis is performed from the domes of the diaphragm to the pubic symphysis. Limited evaluation of the solid organs due to lack of intravenous contrast. Unless otherwise specified, incidental thyroid, adrenal, renal lesions do not require dedicated imaging follow up. Additionally, any mentioned pulmonary nodules do not require dedicated imaging follow-up based on the Fleischner guidelines unless otherwise specified. Coronary calcifications are not identified unless otherwise specified.  COMPARISON: 01/24/2024  FINDINGS:  There are subsegmental atelectatic changes within the lung bases. The heart is normal in size. There are coronary  calcifications. Transcatheter aortic valve replacement noted. There is a small hiatal hernia. Previously noted hypoattenuating lesion in hepatic segment 5/6 is decreased in size now measuring 2.5 cm (previously measuring 3.1 cm when measured in similar fashion on prior).  The gallbladder is surgically absent. The spleen is normal. The pancreas is normal. Adrenals are normal. Left renal cyst is seen. The prostate is enlarged and the urinary bladder is distended with mild bilateral hydroureteronephrosis.  The abdominal aorta is normal in caliber. Scattered atherosclerotic changes are present. There is colonic diverticulosis. The appendix is normal. Large and small bowel loops are otherwise within normal limits. There is no free fluid or pathologic lymphadenopathy by size criteria. Thoracolumbar/sacral fusion noted. There are degenerative changes of the spine and bony pelvis.  IMPRESSION:  Evidence liver lesion in the inferior right lobe appears decreased in size compared to prior.  Enlarged prostate with distended urinary bladder with mild bilateral hydroureteronephrosis.  Other findings as above.  DOSE REDUCTION: This exam was performed according to our departmental dose-optimization program which includes automated exposure control, adjustment of the mA and/or kV according to patient size and/or use of iterative reconstruction technique.  Electronically signed by: Italy Engel MD 02/16/2024 10:59 AM EDT RP Workstation: [id number]    Pertinent GI Studies   4/2 liver biopsy A. LIVER, RIGHT, BIOPSY:  -  Liver with reactive fibrosis and acute inflammation consistent with  the clinical impression of abscess.      01/17/24 EGD for IDA - The posterior oropharynx is abnormal with congested mucosa and hematin being notedthis needs further clarification and evaluation. - Grade I esophageal varices noted proximally and in the middle of the esophagus. - No gross lesions in the distal  esophagus. And no evidence of varices noted distally. - Low-grade of narrowing Schatzki ring. - 5 cm hiatal hernia. - Erythematous mucosa in the stomach. Biopsied. - No evidence of portal gastropathy or varices of the stomach. - No gross lesions in the duodenal bulb, in the first portion of the duodenum and in the second portion of the duodenum. Biopsied.   Colonoscopy November 2021 for iron  deficiency anemia Good bowel prep Two 3 to 5 mm polyps in the descending colon and in the transverse colon were removed.  Sigmoid diverticulosis.  Nonbleeding internal hemorrhoids.  Significant looping of the colon   Past Medical History:  Diagnosis Date   Anemia    Essential hypertension 07/18/2017   Foot drop, left foot    due to back surgery in 01/2021   GERD (gastroesophageal reflux disease)    History of colonic polyps    History of hiatal hernia    many years ago   History of rheumatic fever    History of stroke 07/18/2017   Hypertriglyceridemia  07/18/2017   S/P TAVR (transcatheter aortic valve replacement) 01/26/2022   Sharman Debar 29mm via TF approach with Dr. Lorie Rook and Dr. Sherene Dilling   Severe aortic stenosis    Type 2 diabetes mellitus with diabetic neuropathy, unspecified (HCC) 07/18/2017    Past Surgical History:  Procedure Laterality Date   COLONOSCOPY     around 2018 [facility]   COLONOSCOPY  09/02/2020   CRANIOTOMY Left 10/25/2019   Procedure: CRANIOTOMY HEMATOMA EVACUATION SUBDURAL;  Surgeon: Van Gelinas, MD;  Location: [facility] OR;  Service: Neurosurgery;  Laterality: Left;   ESOPHAGOGASTRODUODENOSCOPY     around 2018 with [facility]   ESOPHAGOGASTRODUODENOSCOPY N/A 01/17/2024   Procedure: EGD (ESOPHAGOGASTRODUODENOSCOPY);  Surgeon: Normie Becton., MD;  Location: [facility];  Service: Gastroenterology;  Laterality: N/A;   FOOT CAPSULOTOMY Left 07/25/2008   Mid Foot #2 MPJ   Hammertoe Repair Left 07/25/2008   #2 toe   HAND SURGERY     nodule removed from left  thumb and right index finger, Dr. Claudius Cumins   INTRAOPERATIVE TRANSTHORACIC ECHOCARDIOGRAM N/A 01/26/2022   Procedure: INTRAOPERATIVE TRANSTHORACIC ECHOCARDIOGRAM;  Surgeon: Kyra Phy, MD;  Location: [facility] OR;  Service: Open Heart Surgery;  Laterality: N/A;   MOHS SURGERY  01/25/2023   right arm   RIGHT/LEFT HEART CATH AND CORONARY ANGIOGRAPHY N/A 12/11/2021   Procedure: RIGHT/LEFT HEART CATH AND CORONARY ANGIOGRAPHY;  Surgeon: Lucendia Rusk, MD;  Location: [facility] CV LAB;  Service: Cardiovascular;  Laterality: N/A;   SPINAL FUSION  01/2021   TARSAL TUNNEL RELEASE Left 07/25/2008   TONSILLECTOMY     removed as a child   TRANSCATHETER AORTIC VALVE REPLACEMENT, TRANSFEMORAL N/A 01/26/2022   Procedure: Transcatheter Aortic Valve Replacement , Transfemoral;  Surgeon: Kyra Phy, MD;  Location: [facility] OR;  Service: Open Heart Surgery;  Laterality: N/A;  Percutaneous   UPPER GASTROINTESTINAL ENDOSCOPY  09/02/2020    Family History  Problem Relation Age of Onset   Hyperlipidemia Mother    Colon polyps Mother    Hyperlipidemia Father    Alcoholism Brother    Healthy Son    Healthy Son    Cancer Neg Hx    Colon cancer Neg Hx    Esophageal cancer Neg Hx    Rectal cancer Neg Hx    Stomach cancer Neg Hx     Prior to Admission medications   Medication Sig Start Date End Date Taking? Authorizing Provider  apixaban  (ELIQUIS ) 5 MG TABS tablet Take 1 tablet (5 mg total) by mouth 2 (two) times daily. 01/26/24  Yes Faith Homes, MD  acetaminophen  (TYLENOL ) 325 MG tablet Take 650 mg by mouth every 6 (six) hours as needed for mild pain or headache.    [provider]  amLODipine  (NORVASC ) 10 MG tablet Take 1 tablet (10 mg total) by mouth daily. 01/27/24   Faith Homes, MD  aspirin  EC 81 MG tablet Take 1 tablet (81 mg total) by mouth daily. Swallow whole. Patient not taking: Reported on 02/07/2024 01/26/23   McDaniel, Jill D, NP  B Complex Vitamins (B COMPLEX PO) Take 1  tablet by mouth daily.    [provider]  Cholecalciferol (VITAMIN D3 SUPER STRENGTH) 50 MCG (2000 UT) CAPS Take 2,000 Units by mouth daily.    [provider]  escitalopram  (LEXAPRO ) 20 MG tablet Take 1 tablet (20 mg total) by mouth daily. 08/29/23   Jobe Mulder, DO  etanercept  (ENBREL  MINI) 50 MG/ML injection Inject 1 mL (50 mg  total) into the skin once a week. 11/03/23   Romayne Clubs, PA-C  ezetimibe  (ZETIA ) 10 MG tablet Take 1 tablet (10 mg total) by mouth daily. 11/08/23   Jobe Mulder, DO  fenofibrate  micronized (LOFIBRA) 200 MG capsule TAKE 1 CAPSULE DAILY BEFOREBREAKFAST 06/17/23   Jobe Mulder, DO  finasteride  (PROSCAR ) 5 MG tablet Take 1 tablet (5 mg total) by mouth daily. 02/07/23   Scarlet Curly, MD  fluticasone  (FLONASE ) 50 MCG/ACT nasal spray Place 2 sprays into both nostrils daily. 06/16/22   Jobe Mulder, DO  insulin  aspart (NOVOLOG  FLEXPEN) 100 UNIT/ML FlexPen Inject 0-20 Units into the skin See admin instructions. Inject 0-10 units into the skin before meals, PER SLIDING SCALE: BGL 0-200 = give nothing, 201-250 = 2 units, 251-300 = 4 units, 301-350 = 6 units, 351-400 = 8 units, >400 = 10 units, push fluids, and repeat BGL check in 2 hours. If BGL remains >400 after 2 hours, CALL MD.    [provider]  insulin  degludec (TRESIBA  FLEXTOUCH) 100 UNIT/ML FlexTouch Pen Inject 20 Units into the skin daily.    [provider]  leflunomide  (ARAVA ) 10 MG tablet Take 1 tablet (10 mg total) by mouth daily. 01/30/24   Romayne Clubs, PA-C  lisinopril  (ZESTRIL ) 40 MG tablet Take 1 tablet (40 mg total) by mouth in the morning. 10/05/23   Jobe Mulder, DO  magnesium  oxide (MAG-OX) 400 MG tablet Take 400 mg by mouth daily with lunch.    [provider]  melatonin 5 MG TABS Take 5 mg by mouth at bedtime.    [provider]  metoprolol  tartrate (LOPRESSOR ) 25 MG tablet Take 0.5 tablets (12.5 mg  total) by mouth 2 (two) times daily. 01/26/24   Faith Homes, MD  Multiple Vitamins-Minerals (MULTIVITAMIN WITH MINERALS) tablet Take 1 tablet by mouth daily with breakfast.    [provider]  Omega 3 1000 MG CAPS Take 2,000 mg by mouth daily with lunch.    [provider]  omeprazole  (PRILOSEC) 20 MG capsule Take 1 capsule (20 mg total) by mouth daily. 06/08/23   Jobe Mulder, DO  [facility] VERIO test strip 1 each by Other route as needed for other (Glucose). 06/14/20   [provider]  Potassium 99 MG TABS Take 99 mg by mouth every evening. Patient not taking: Reported on 02/07/2024    [provider]  potassium citrate (UROCIT-K) 10 MEQ (1080 MG) SR tablet Take 10 mEq by mouth 2 (two) times daily.    [provider]  traZODone  (DESYREL ) 50 MG tablet Take 0.5 tablets (25 mg total) by mouth every evening. Give around 8pm 01/26/24   Faith Homes, MD  vitamin B-12 (CYANOCOBALAMIN ) 1000 MCG tablet Take 1,000 mcg by mouth daily.    [provider]  vitamin E 180 MG (400 UNITS) capsule Take 400 Units by mouth daily.    [provider]    Current Facility-Administered Medications  Medication Dose Route Frequency Provider Last Rate Last Admin   0.9 %  sodium chloride  infusion (Manually program via Guardrails IV Fluids)   Intravenous Once Soto, Johana, PA-C       prothrombin complex conc human (KCENTRA) IVPB 2,790 Units  2,790 Units Intravenous STAT Nathanael Baker, DO       Current Outpatient Medications  Medication Sig Dispense Refill   apixaban  (ELIQUIS ) 5 MG TABS tablet Take 1 tablet (5 mg total) by mouth 2 (two) times daily.  acetaminophen  (TYLENOL ) 325 MG tablet Take 650 mg by mouth every 6 (six) hours as needed for mild pain or headache.     amLODipine  (NORVASC ) 10 MG tablet Take 1 tablet (10 mg total) by mouth daily.     [Paused] aspirin  EC 81 MG tablet Take 1 tablet (81 mg total) by mouth daily. Swallow whole.  (Patient not taking: Reported on 02/07/2024) 90 tablet 3   B Complex Vitamins (B COMPLEX PO) Take 1 tablet by mouth daily.     Cholecalciferol (VITAMIN D3 SUPER STRENGTH) 50 MCG (2000 UT) CAPS Take 2,000 Units by mouth daily.     escitalopram  (LEXAPRO ) 20 MG tablet Take 1 tablet (20 mg total) by mouth daily. 90 tablet 2   etanercept  (ENBREL  MINI) 50 MG/ML injection Inject 1 mL (50 mg total) into the skin once a week. 12 mL 0   ezetimibe  (ZETIA ) 10 MG tablet Take 1 tablet (10 mg total) by mouth daily. 90 tablet 3   fenofibrate  micronized (LOFIBRA) 200 MG capsule TAKE 1 CAPSULE DAILY BEFOREBREAKFAST 90 capsule 3   finasteride  (PROSCAR ) 5 MG tablet Take 1 tablet (5 mg total) by mouth daily. 90 tablet 3   fluticasone  (FLONASE ) 50 MCG/ACT nasal spray Place 2 sprays into both nostrils daily. 16 g 6   insulin  aspart (NOVOLOG  FLEXPEN) 100 UNIT/ML FlexPen Inject 0-20 Units into the skin See admin instructions. Inject 0-10 units into the skin before meals, PER SLIDING SCALE: BGL 0-200 = give nothing, 201-250 = 2 units, 251-300 = 4 units, 301-350 = 6 units, 351-400 = 8 units, >400 = 10 units, push fluids, and repeat BGL check in 2 hours. If BGL remains >400 after 2 hours, CALL MD.     insulin  degludec (TRESIBA  FLEXTOUCH) 100 UNIT/ML FlexTouch Pen Inject 20 Units into the skin daily.     leflunomide  (ARAVA ) 10 MG tablet Take 1 tablet (10 mg total) by mouth daily. 90 tablet 0   lisinopril  (ZESTRIL ) 40 MG tablet Take 1 tablet (40 mg total) by mouth in the morning. 90 tablet 3   magnesium  oxide (MAG-OX) 400 MG tablet Take 400 mg by mouth daily with lunch.     melatonin 5 MG TABS Take 5 mg by mouth at bedtime.     metoprolol  tartrate (LOPRESSOR ) 25 MG tablet Take 0.5 tablets (12.5 mg total) by mouth 2 (two) times daily.     Multiple Vitamins-Minerals (MULTIVITAMIN WITH MINERALS) tablet Take 1 tablet by mouth daily with breakfast.     Omega 3 1000 MG CAPS Take 2,000 mg by mouth daily with lunch.     omeprazole   (PRILOSEC) 20 MG capsule Take 1 capsule (20 mg total) by mouth daily. 90 capsule 3   ONETOUCH VERIO test strip 1 each by Other route as needed for other (Glucose).     Potassium 99 MG TABS Take 99 mg by mouth every evening. (Patient not taking: Reported on 02/07/2024)     potassium citrate (UROCIT-K) 10 MEQ (1080 MG) SR tablet Take 10 mEq by mouth 2 (two) times daily.     traZODone  (DESYREL ) 50 MG tablet Take 0.5 tablets (25 mg total) by mouth every evening. Give around 8pm     vitamin B-12 (CYANOCOBALAMIN ) 1000 MCG tablet Take 1,000 mcg by mouth daily.     vitamin E 180 MG (400 UNITS) capsule Take 400 Units by mouth daily.      Allergies as of 03/02/2024 - Review Complete 03/02/2024  Allergen Reaction Noted   Canagliflozin Other (See Comments)  02/25/2017   Aspirin  Other (See Comments) 05/15/2015   Atorvastatin Other (See Comments) 05/13/2014   Statins Other (See Comments) 11/29/2017    Social History   Socioeconomic History   Marital status: Married    Spouse name: Not on file   Number of children: 2   Years of education: Not on file   Highest education level: Not on file  Occupational History   Not on file  Tobacco Use   Smoking status: Former    Current packs/day: 0.00    Average packs/day: 1 pack/day for 10.0 years (10.0 ttl pk-yrs)    Types: Cigarettes    Start date: 49    Quit date: 11    Years since quitting: 52.4    Passive exposure: Past   Smokeless tobacco: Never  Vaping Use   Vaping status: Never Used  Substance and Sexual Activity   Alcohol use: Not Currently   Drug use: No   Sexual activity: Yes  Other Topics Concern   Not on file  Social History Narrative   Not on file   Social Drivers of Health   Financial Resource Strain: Low Risk  (11/04/2023)   Overall Financial Resource Strain (CARDIA)    Difficulty of Paying Living Expenses: Not hard at all  Food Insecurity: No Food Insecurity (01/16/2024)   Hunger Vital Sign    Worried About Running Out of  Food in the Last Year: Never true    Ran Out of Food in the Last Year: Never true  Transportation Needs: No Transportation Needs (01/16/2024)   PRAPARE - Administrator, Civil Service (Medical): No    Lack of Transportation (Non-Medical): No  Physical Activity: Inactive (11/04/2023)   Exercise Vital Sign    Days of Exercise per Week: 0 days    Minutes of Exercise per Session: 0 min  Stress: No Stress Concern Present (11/04/2023)   [employer] of Occupational Health - Occupational Stress Questionnaire    Feeling of Stress : Not at all  Social Connections: Socially Integrated (01/15/2024)   Social Connection and Isolation Panel [NHANES]    Frequency of Communication with Friends and Family: More than three times a week    Frequency of Social Gatherings with Friends and Family: Twice a week    Attends Religious Services: More than 4 times per year    Active Member of [employer] or Organizations: Yes    Attends Engineer, structural: More than 4 times per year    Marital Status: Married  Catering manager Violence: Not At Risk (01/16/2024)   Humiliation, Afraid, Rape, and Kick questionnaire    Fear of Current or Ex-Partner: No    Emotionally Abused: No    Physically Abused: No    Sexually Abused: No    Code Status   Code Status: Prior  Review of Systems: All systems reviewed and negative except where noted in HPI.  Physical Exam: Vital signs in last 24 hours: Temp:  [97.5 F (36.4 C)] 97.5 F (36.4 C) (05/16 1302) Pulse Rate:  [65-68] 65 (05/16 1315) Resp:  [9-15] 15 (05/16 1315) BP: (119-122)/(57-60) 119/60 (05/16 1315) SpO2:  [100 %] 100 % (05/16 1315)   General:  Pleasant male in NAD, limited historian Psych:  Cooperative. Normal mood and affect Eyes: Pupils equal Ears:  Normal auditory acuity Nose: No deformity, discharge or lesions Neck:  Supple, no masses felt Lungs:  Clear to auscultation.  Heart:  Regular rate, regular rhythm.  Abdomen:  Soft,  nondistended,  nontender, active bowel sounds, no masses felt Rectal :  Deferred Msk: Symmetrical without gross deformities.  Neurologic:  Alert, oriented, grossly normal neurologically Extremities : 2+ BLE edema    Mai Schwalbe, NP-C   03/02/2024, 3:38 PM

## 2024-03-02 NOTE — ED Triage Notes (Signed)
 PT BIB EMS from [facility] unit, for a low Hgb of 6.5, related to blood in his stool.Some confusion at baseline.   116/66 100 % RA HR 66

## 2024-03-02 NOTE — ED Notes (Signed)
 Shift report received, assumed care of patient at this time. Patient has ready bed upon assuming care of patient.

## 2024-03-02 NOTE — ED Provider Notes (Signed)
 [place] [facility] Provider Note   CSN: [id number] Arrival date & time: 03/02/24  1251     History DM, GI bleed, Subdural Hematoma No chief complaint on file.   Tommy Green is a 78 y.o. male.  78 year old male with a past medical history of DM, GI bleed, Subdural hematoma presents to the ED from [facility] facility with abnormal labs.  According to Deatra Face his wife at the bedside, patient suffered a fall on Monday, where he bumped the posterior aspect of his head, he is anticoagulated on Eliquis  currently.  Since then he has "dementia", has worsened, he continues to be more forgetful, has had some more memory problems.  In addition, she reports today they were called as he had abnormal labs which were drawn this morning.  Noted to have a hemoglobin less than 6.  Patient has not noted any blood in his stool, but reports usually he bleeds from his rectum.  Patient is endorsing pain along the left arm status post mechanical fall which occurred on Monday.  He has not been evaluated since he suffered a fall on Monday. Denies any headache, fever, vomiting or other complaints.   The history is provided by the patient.       Home Medications Prior to Admission medications   Medication Sig Start Date End Date Taking? Authorizing Provider  acetaminophen  (TYLENOL ) 325 MG tablet Take 650 mg by mouth every 6 (six) hours as needed for mild pain or headache.   Yes [provider]  amLODipine  (NORVASC ) 10 MG tablet Take 1 tablet (10 mg total) by mouth daily. 01/27/24  Yes Faith Homes, MD  apixaban  (ELIQUIS ) 5 MG TABS tablet Take 1 tablet (5 mg total) by mouth 2 (two) times daily. 01/26/24  Yes Faith Homes, MD  Cholecalciferol  (VITAMIN D3 SUPER STRENGTH) 50 MCG (2000 UT) CAPS Take 2,000 Units by mouth daily.   Yes [provider]  ciprofloxacin  (CIPRO ) 500 MG tablet Take 500 mg by mouth 2 (two) times daily.   Yes [provider]   escitalopram  (LEXAPRO ) 20 MG tablet Take 1 tablet (20 mg total) by mouth daily. 08/29/23  Yes Jobe Mulder, DO  etanercept  (ENBREL  MINI) 50 MG/ML injection Inject 1 mL (50 mg total) into the skin once a week. 11/03/23  Yes Romayne Clubs, PA-C  ezetimibe  (ZETIA ) 10 MG tablet Take 1 tablet (10 mg total) by mouth daily. Patient taking differently: Take 10 mg by mouth every evening. 11/08/23  Yes Wendling, Shellie Dials, DO  fenofibrate  micronized (LOFIBRA) 200 MG capsule TAKE 1 CAPSULE DAILY BEFOREBREAKFAST 06/17/23  Yes Jobe Mulder, DO  finasteride  (PROSCAR ) 5 MG tablet Take 1 tablet (5 mg total) by mouth daily. Patient taking differently: Take 5 mg by mouth every evening. 02/07/23  Yes Scarlet Curly, MD  fluticasone  (FLONASE ) 50 MCG/ACT nasal spray Place 2 sprays into both nostrils daily. Patient taking differently: Place 2 sprays into both nostrils at bedtime. 06/16/22  Yes Jobe Mulder, DO  furosemide  (LASIX ) 20 MG tablet Take 20 mg by mouth daily.   Yes [provider]  insulin  degludec (TRESIBA  FLEXTOUCH) 100 UNIT/ML FlexTouch Pen Inject 20 Units into the skin daily.   Yes [provider]  insulin  lispro (HUMALOG) 100 UNIT/ML injection Inject 0-10 Units into the skin 2 (two) times daily with a meal. Inject 0-10 units into the skin before meals, PER SLIDING SCALE: BGL 0-200 = give nothing, 201-250 = 2 units, 251-300 =  4 units, 301-350 = 6 units, 351-400 = 8 units, >400 = 10 units, push fluids, and repeat BGL check in 2 hours. If BGL remains >400 after 2 hours, CALL MD.   Yes [provider]  leflunomide  (ARAVA ) 10 MG tablet Take 1 tablet (10 mg total) by mouth daily. 01/30/24  Yes Romayne Clubs, PA-C  lisinopril  (ZESTRIL ) 40 MG tablet Take 1 tablet (40 mg total) by mouth in the morning. 10/05/23  Yes Jobe Mulder, DO  loperamide  (IMODIUM  A-D) 2 MG tablet Take 2 mg by mouth as needed for diarrhea or loose stools.   Yes [provider]  magnesium  oxide (MAG-OX) 400 MG tablet Take 400 mg by mouth daily with lunch.   Yes [provider]  melatonin 5 MG TABS Take 5 mg by mouth at bedtime.   Yes [provider]  metoprolol  tartrate (LOPRESSOR ) 25 MG tablet Take 0.5 tablets (12.5 mg total) by mouth 2 (two) times daily. 01/26/24  Yes Faith Homes, MD  metroNIDAZOLE  (FLAGYL ) 500 MG tablet Take 500 mg by mouth 2 (two) times daily.   Yes [provider]  Multiple Vitamins-Minerals (MULTIVITAMIN WITH MINERALS) tablet Take 1 tablet by mouth at bedtime.   Yes [provider]  Omega 3 1000 MG CAPS Take 2,000 mg by mouth daily with lunch.   Yes [provider]  omeprazole  (PRILOSEC) 20 MG capsule Take 1 capsule (20 mg total) by mouth daily. Patient taking differently: Take 20 mg by mouth daily before breakfast. 06/08/23  Yes Wendling, Shellie Dials, DO  potassium citrate (UROCIT-K) 10 MEQ (1080 MG) SR tablet Take 10 mEq by mouth 2 (two) times daily.   Yes [provider]  tamsulosin  (FLOMAX ) 0.4 MG CAPS capsule Take 0.8 mg by mouth at bedtime. 02/16/24  Yes [provider]  traZODone  (DESYREL ) 100 MG tablet Take 100 mg by mouth at bedtime.   Yes [provider]  vitamin B-12 (CYANOCOBALAMIN ) 1000 MCG tablet Take 1,000 mcg by mouth daily.   Yes [provider]  vitamin E  180 MG (400 UNITS) capsule Take 400 Units by mouth daily.   Yes [provider]  aspirin  EC 81 MG tablet Take 1 tablet (81 mg total) by mouth daily. Swallow whole. Patient not taking: Reported on 02/07/2024 01/26/23   Drema Genta D, NP      Allergies    Canagliflozin, Aspirin , Atorvastatin, and Statins    Review of Systems   Review of Systems  Constitutional:  Negative for chills and fever.  Respiratory:  Negative for shortness of breath.   Cardiovascular:  Negative for chest pain.  Gastrointestinal:  Positive for blood in stool. Negative for abdominal pain, nausea  and vomiting.  Genitourinary:  Negative for flank pain.  Musculoskeletal:  Negative for back pain.  Skin:  Negative for pallor and wound.  Neurological:  Negative for headaches.  All other systems reviewed and are negative.   Physical Exam Updated Vital Signs BP (!) 142/63 (BP Location: Left Arm)   Pulse 65   Temp (!) 97.5 F (36.4 C) (Oral)   Resp 18   Ht 5\' 11"  (1.803 m)   Wt 108 kg   SpO2 99%   BMI 33.22 kg/m  Physical Exam Vitals and nursing note reviewed.  Constitutional:      Appearance: He is well-developed.  HENT:     Head: Normocephalic and atraumatic.  Eyes:     General: No scleral icterus.    Pupils: Pupils are equal,  round, and reactive to light.  Cardiovascular:     Heart sounds: Normal heart sounds.     Comments: BLLE with ace wraps.  Pulmonary:     Effort: Pulmonary effort is normal.     Breath sounds: Normal breath sounds. No wheezing.  Chest:     Chest wall: No tenderness.  Abdominal:     General: Bowel sounds are normal. There is no distension.     Palpations: Abdomen is soft.     Tenderness: There is no abdominal tenderness.  Genitourinary:    Rectum: Normal. Guaiac result negative. No tenderness.     Comments: No gross blood noted, no internal or external hemorrhoids noted.  Musculoskeletal:        General: No tenderness or deformity.     Cervical back: Normal range of motion.     Right lower leg: 1+ Pitting Edema present.     Left lower leg: 1+ Pitting Edema present.  Skin:    General: Skin is warm and dry.  Neurological:     Mental Status: He is alert and oriented to person, place, and time.     ED Results / Procedures / Treatments   Labs (all labs ordered are listed, but only abnormal results are displayed) Labs Reviewed  MRSA NEXT GEN BY PCR, NASAL - Abnormal; Notable for the following components:      Result Value   MRSA by PCR Next Gen DETECTED (*)    All other components within normal limits  CBC WITH DIFFERENTIAL/PLATELET -  Abnormal; Notable for the following components:   RBC 2.39 (*)    Hemoglobin 6.4 (*)    HCT 20.5 (*)    RDW 19.2 (*)    All other components within normal limits  COMPREHENSIVE METABOLIC PANEL WITH GFR - Abnormal; Notable for the following components:   Chloride 112 (*)    CO2 16 (*)    Glucose, Bld 169 (*)    BUN 47 (*)    Creatinine, Ser 2.32 (*)    Calcium 8.0 (*)    Total Protein 5.3 (*)    Albumin  2.4 (*)    GFR, Estimated 28 (*)    All other components within normal limits  URINALYSIS, ROUTINE W REFLEX MICROSCOPIC - Abnormal; Notable for the following components:   APPearance CLOUDY (*)    Leukocytes,Ua LARGE (*)    Bacteria, UA RARE (*)    All other components within normal limits  PROTIME-INR - Abnormal; Notable for the following components:   Prothrombin  Time 17.8 (*)    INR 1.4 (*)    All other components within normal limits  BRAIN NATRIURETIC PEPTIDE - Abnormal; Notable for the following components:   B Natriuretic Peptide 102.1 (*)    All other components within normal limits  BASIC METABOLIC PANEL WITH GFR - Abnormal; Notable for the following components:   Chloride 115 (*)    CO2 17 (*)    Glucose, Bld 64 (*)    BUN 45 (*)    Creatinine, Ser 2.14 (*)    Calcium 8.2 (*)    GFR, Estimated 31 (*)    All other components within normal limits  HEMOGLOBIN AND HEMATOCRIT, BLOOD - Abnormal; Notable for the following components:   Hemoglobin 7.2 (*)    HCT 21.5 (*)    All other components within normal limits  GLUCOSE, CAPILLARY - Abnormal; Notable for the following components:   Glucose-Capillary 128 (*)    All other components within normal limits  CBC - Abnormal; Notable for the following components:   RBC 2.62 (*)    Hemoglobin 7.1 (*)    HCT 21.6 (*)    RDW 18.2 (*)    All other components within normal limits  HEMOGLOBIN AND HEMATOCRIT, BLOOD - Abnormal; Notable for the following components:   Hemoglobin 7.6 (*)    HCT 22.8 (*)    All other components  within normal limits  HEMOGLOBIN AND HEMATOCRIT, BLOOD - Abnormal; Notable for the following components:   Hemoglobin 7.4 (*)    HCT 22.8 (*)    All other components within normal limits  GLUCOSE, CAPILLARY - Abnormal; Notable for the following components:   Glucose-Capillary 65 (*)    All other components within normal limits  MAGNESIUM  - Abnormal; Notable for the following components:   Magnesium  1.5 (*)    All other components within normal limits  GLUCOSE, CAPILLARY - Abnormal; Notable for the following components:   Glucose-Capillary 144 (*)    All other components within normal limits  GLUCOSE, CAPILLARY - Abnormal; Notable for the following components:   Glucose-Capillary 138 (*)    All other components within normal limits  GLUCOSE, CAPILLARY - Abnormal; Notable for the following components:   Glucose-Capillary 181 (*)    All other components within normal limits  POC OCCULT BLOOD, ED - Abnormal; Notable for the following components:   Fecal Occult Bld POSITIVE (*)    All other components within normal limits  C DIFFICILE QUICK SCREEN W PCR REFLEX    LIPASE, BLOOD  GLUCOSE, CAPILLARY  GLUCOSE, CAPILLARY  HEMOGLOBIN AND HEMATOCRIT, BLOOD  HEMOGLOBIN AND HEMATOCRIT, BLOOD  HEMOGLOBIN AND HEMATOCRIT, BLOOD  TYPE AND SCREEN  PREPARE RBC (CROSSMATCH)    EKG None  Radiology CT ABDOMEN PELVIS WO CONTRAST Result Date: 03/03/2024 CLINICAL DATA:  Blunt trauma yesterday, fell, with anemia. Looking for retroperitoneal bleed. EXAM: CT ABDOMEN AND PELVIS WITHOUT CONTRAST TECHNIQUE: Multidetector CT imaging of the abdomen and pelvis was performed following the standard protocol without IV contrast. RADIATION DOSE REDUCTION: This exam was performed according to the departmental dose-optimization program which includes automated exposure control, adjustment of the mA and/or kV according to patient size and/or use of iterative reconstruction technique. COMPARISON:  CT chest, abdomen and  pelvis without contrast yesterday at 6:28 p.m., CT abdomen and pelvis without contrast 02/14/2024. FINDINGS: Lower chest: There are minimal bilateral layering pleural effusions new since yesterday's exam. There is mild interstitial edema in both lung bases also new, possibly due to fluid overload congestive. There is increased bilateral lower lobe bronchial thickening which could be bronchitic or congestive. No focal infiltrate is seen. The heart is slightly enlarged. The cardiac blood pool is less dense than the myocardium consistent with anemia. There is a TAVR, and calcification in the coronary arteries and mitral ring. Small hiatal hernia. Hepatobiliary: There is a 2.2 cm hypodense lesion in hepatic segment 5, evaluated on MRI 01/16/2024 and thought to be infectious, on MRI was about double the size, unchanged since yesterday, was 2.5 cm on 02/14/2024. There is a stable 1 cm nonspecific hypodensity in segment 8 on 3:20, indeterminate. Rest of the unenhanced liver is homogeneous. The gallbladder is absent without biliary dilatation. Pancreas: Unremarkable without contrast. Spleen: Unremarkable without contrast. Adrenals/Urinary Tract: No adrenal mass. 2.3 cm Bosniak 1 cyst superior pole left kidney requires no follow-up imaging. No other contour deforming abnormality of the unenhanced kidneys is seen. There is no urinary stone or obstruction. No bladder thickening. Stomach/Bowel: Negative for  dilatation or wall thickening. Uncomplicated sigmoid diverticulosis. Normal appendix. Vascular/Lymphatic: Aortic atherosclerosis. No enlarged abdominal or pelvic lymph nodes. Reproductive: Prostate is unremarkable. Other: Bilateral fat hernias. No incarcerated hernia. Trace presacral ascites. There is no free hemorrhage. Body wall edema again seen in the flanks and thighs. Musculoskeletal: Bilateral rod and pedicle screw fusion construct T10-S1 is again noted with bilateral sacroiliac joint bolts. Ankylosis anterior left SI  joint advanced bilateral hip DJD. Osteopenia. IMPRESSION: 1. No evidence of retroperitoneal bleed. 2. Minimal bilateral pleural effusions and mild interstitial edema in the lung bases, new since yesterday's exam, possibly due to fluid overload or congestive. 3. Increased bilateral lower lobe bronchial thickening which could be bronchitic or congestive. 4. Mild cardiomegaly with TAVR. Aortic and coronary artery atherosclerosis. 5. 2.2 cm hypodense lesion in hepatic segment 5, evaluated on MRI 01/16/2024 and thought to be infectious, unchanged since yesterday, was 2.5 cm on 02/14/2024. 6. 1 cm nonspecific hypodensity in segment 8 of the liver, unchanged. 7. Trace presacral ascites. 8. Body wall edema in the flanks and thighs. 9. Diverticulosis without evidence of diverticulitis. 10. Osteopenia, postsurgical and degenerative change. Aortic Atherosclerosis (ICD10-I70.0).  Or Electronically Signed   By: Denman Fischer M.D.   On: 03/03/2024 07:03   CT CHEST ABDOMEN PELVIS WO CONTRAST Result Date: 03/02/2024 CLINICAL DATA:  Blunt trauma EXAM: CT CHEST, ABDOMEN AND PELVIS WITHOUT CONTRAST TECHNIQUE: Multidetector CT imaging of the chest, abdomen and pelvis was performed following the standard protocol without IV contrast. RADIATION DOSE REDUCTION: This exam was performed according to the departmental dose-optimization program which includes automated exposure control, adjustment of the mA and/or kV according to patient size and/or use of iterative reconstruction technique. COMPARISON:  CT 02/14/2024, 01/15/2024, MRI 01/16/2024 FINDINGS: CT CHEST FINDINGS Cardiovascular: Limited evaluation without intravenous contrast.aortic valve prosthesis. Dense mitral calcification. Coronary vascular calcification. Normal cardiac size. No pericardial effusion Mediastinum/Nodes: Patent trachea. No thyroid mass. No suspicious lymph nodes. Esophagus within normal limits. Lungs/Pleura: Lungs are clear. No pleural effusion or pneumothorax.  Subpleural left lower lobe pulmonary nodule measures 6 mm on series 4, image 79, previously 7 mm. Mild dependent atelectasis Musculoskeletal: Sternum appears intact. No acute osseous abnormality. CT ABDOMEN PELVIS FINDINGS Hepatobiliary: Cholecystectomy. No biliary dilatation. Further decrease in size of a inferior right hepatic lobe hypodensity, now measuring 11 mm maximum on series 3, image 68, previously 2.5 cm. Pancreas: Unremarkable. No pancreatic ductal dilatation or surrounding inflammatory changes. Spleen: Normal in size without focal abnormality. Adrenals/Urinary Tract: Adrenal glands are normal. Slight prominence of right renal pelvis and ureter but no obstructing stone. The bladder is normal Stomach/Bowel: Stomach nonenlarged. No dilated small bowel. Diffuse fluid in the colon. Negative appendix. No acute bowel wall thickening. Mild diverticular disease of the left colon Vascular/Lymphatic: Aortic atherosclerosis. No enlarged abdominal or pelvic lymph nodes. Reproductive: Prostate is unremarkable. Other: Negative for pelvic effusion or free air. Subcutaneous edema at the bilateral hips. Musculoskeletal: Posterior spinal fusion hardware extends from T10 to the upper sacrum. There are bilateral sacroiliac screws. No acute osseous abnormality. IMPRESSION: 1. Negative for acute intrathoracic, intra-abdominal, or intrapelvic abnormality. 2. Diffuse fluid in the colon suggesting diarrheal illness. Mild diverticular disease of the left colon without acute inflammatory process. 3. Further decrease in size of inferior right hepatic lobe hypodensity, now measuring 11 mm, previously 2.5 cm. 4. 6 mm left lower lobe pulmonary nodule, previously 7 mm. This is grossly stable as compared with November 2024 limited lung bases on abdominal CT. Future CT at 18-24 months (from 08/21/2023  scan) is considered optional for low-risk patients, but is recommended for high-risk patients. This recommendation follows the consensus  statement: Guidelines for Management of Incidental Pulmonary Nodules Detected on CT Images: From the Fleischner Society 2017; Radiology 2017; 284:228-243. 5. Aortic atherosclerosis. Aortic Atherosclerosis (ICD10-I70.0). Electronically Signed   By: Esmeralda Hedge M.D.   On: 03/02/2024 19:02   CT HEAD WO CONTRAST ( ) Result Date: 03/02/2024 CLINICAL DATA:  Head trauma abnormal mental status EXAM: CT HEAD WITHOUT CONTRAST TECHNIQUE: Contiguous axial images were obtained from the base of the skull through the vertex without intravenous contrast. RADIATION DOSE REDUCTION: This exam was performed according to the departmental dose-optimization program which includes automated exposure control, adjustment of the mA and/or kV according to patient size and/or use of iterative reconstruction technique. COMPARISON:  CT brain 01/15/2024, 12/09/2021, MRI 03/06/2021 FINDINGS: Brain: No acute territorial infarction, hemorrhage, or intracranial mass. Chronic encephalomalacia at the inferolateral left frontal lobe. Chronic left dural thickening. Stable ventricle size. Atrophy and chronic small vessel ischemic changes of the white matter Vascular: No hyperdense vessels.  Carotid vascular calcification Skull: Left-sided craniotomy.  No fracture Sinuses/Orbits: No acute finding. Other: None IMPRESSION: 1. No CT evidence for acute intracranial abnormality. 2. Atrophy and chronic small vessel ischemic changes of the white matter. Chronic encephalomalacia at the inferolateral left frontal lobe. Electronically Signed   By: Esmeralda Hedge M.D.   On: 03/02/2024 18:43    Procedures .Critical Care  Performed by: Keari Miu, PA-C Authorized by: Hebah Bogosian, PA-C   Critical care provider statement:    Critical care time (minutes):  45   Critical care start time:  03/02/2024 1:00 PM   Critical care end time:  03/02/2024 1:45 PM   Critical care was necessary to treat or prevent imminent or life-threatening deterioration of the  following conditions:  Circulatory failure   Critical care was time spent personally by me on the following activities:  Discussions with primary provider, discussions with consultants and examination of patient   Care discussed with: admitting provider       Medications Ordered in ED Medications  acetaminophen  (TYLENOL ) tablet 650 mg (650 mg Oral Given 03/04/24 0537)  leflunomide  (ARAVA ) tablet 10 mg (10 mg Oral Given 03/03/24 0831)  ezetimibe  (ZETIA ) tablet 10 mg (10 mg Oral Given 03/03/24 0832)  fenofibrate  tablet 160 mg (160 mg Oral Given 03/03/24 0832)  metoprolol  tartrate (LOPRESSOR ) tablet 12.5 mg (12.5 mg Oral Given 03/03/24 2158)  escitalopram  (LEXAPRO ) tablet 20 mg (20 mg Oral Given 03/03/24 0833)  traZODone  (DESYREL ) tablet 25 mg (25 mg Oral Given 03/03/24 1721)  magnesium  oxide (MAG-OX) tablet 400 mg (400 mg Oral Given 03/03/24 1226)  pantoprazole  (PROTONIX ) injection 40 mg (40 mg Intravenous Given 03/03/24 2158)  finasteride  (PROSCAR ) tablet 5 mg (5 mg Oral Given 03/03/24 0831)  cyanocobalamin  (VITAMIN B12) tablet 1,000 mcg (1,000 mcg Oral Given 03/03/24 0831)  melatonin tablet 5 mg (5 mg Oral Given 03/03/24 2158)  B-complex with vitamin C tablet 1 tablet (1 tablet Oral Given 03/03/24 0832)  cholecalciferol  (VITAMIN D3) 25 MCG (1000 UNIT) tablet 2,000 Units (2,000 Units Oral Given 03/03/24 1610)  omega-3 acid ethyl esters (LOVAZA ) capsule 2,000 mg (2,000 mg Oral Given 03/03/24 1227)  vitamin E  capsule 400 Units (400 Units Oral Given 03/03/24 0832)  ondansetron  (ZOFRAN ) tablet 4 mg (has no administration in time range)    Or  ondansetron  (ZOFRAN ) injection 4 mg (has no administration in time range)  albuterol  (PROVENTIL ) (2.5 MG/3ML) 0.083% nebulizer solution 2.5 mg (  has no administration in time range)  aspirin  EC tablet 81 mg (81 mg Oral Given 03/03/24 0831)  insulin  aspart (novoLOG ) injection 0-9 Units (1 Units Subcutaneous Given 03/03/24 1721)  insulin  aspart (novoLOG ) injection 0-5 Units  ( Subcutaneous Not Given 03/03/24 2154)  ciprofloxacin  (CIPRO ) tablet 500 mg (500 mg Oral Given 03/03/24 2159)  metroNIDAZOLE  (FLAGYL ) tablet 500 mg (500 mg Oral Given 03/04/24 0537)  mupirocin  ointment (BACTROBAN ) 2 % 1 Application (1 Application Nasal Given 03/03/24 2201)  Chlorhexidine  Gluconate Cloth 2 % PADS 6 each (6 each Topical Given 03/03/24 1048)  furosemide  (LASIX ) injection 40 mg (40 mg Intravenous Given 03/03/24 1721)  dextrose  50 % solution 50 mL (50 mLs Intravenous Not Given 03/03/24 1048)  dextrose  50 % solution 12.5 g (has no administration in time range)  nystatin  (MYCOSTATIN /NYSTOP ) topical powder ( Topical Given 03/03/24 2202)  diphenoxylate -atropine  (LOMOTIL ) 2.5-0.025 MG per tablet 1 tablet (1 tablet Oral Given 03/03/24 2158)  loperamide  (IMODIUM ) capsule 2 mg (2 mg Oral Given 03/03/24 1605)  leptospermum manuka honey (MEDIHONEY) paste 1 Application (1 Application Topical Given 03/03/24 2200)  liver oil-zinc  oxide (DESITIN) 40 % ointment ( Topical Given 03/03/24 2201)  0.9 %  sodium chloride  infusion (Manually program via Guardrails IV Fluids) (0 mLs Intravenous Stopped 03/03/24 0803)  prothrombin  complex conc human (KCENTRA ) IVPB 2,790 Units (0 Units Intravenous Stopped 03/02/24 1617)  furosemide  (LASIX ) injection 20 mg (20 mg Intravenous Given 03/02/24 2310)  sodium bicarbonate  injection 50 mEq (50 mEq Intravenous Given 03/03/24 1521)  magnesium  sulfate IVPB 2 g 50 mL (0 g Intravenous Stopped 03/03/24 1705)    ED Course/ Medical Decision Making/ A&P Clinical Course as of 03/04/24 0821  Fri Mar 02, 2024  1432 Hemoglobin(!!): 6.4 [JS]  1434 Fecal Occult Blood, POC(!): POSITIVE [JS]  1513 Hemoglobin(!!): 6.4 [JS]  1545 S; hx lower GI bleed on Eliquis ; hemoglobin is 6 at facility here hemoglobin 6.5 did receive Kcentra ; GI will see patient; wife at bedside states he fell 3 days ago and was not evaluated; wife is concerned that he has declined from a mental standpoint since then -> CTH  and CT CAP [WC]    Clinical Course User Index [JS] Jaston Havens, PA-C [WC] Arminda Landmark, MD                                 Medical Decision Making Amount and/or Complexity of Data Reviewed Labs: ordered. Decision-making details documented in ED Course. Radiology: ordered.  Risk Prescription drug management. Decision regarding hospitalization.   This patient presents to the ED for concern of abnormal blood , this involves a number of treatment options, and is a complaint that carries with it a high risk of complications and morbidity.  The differential diagnosis includes GI bleed, intracranial hemorrhage.    Co morbidities: Discussed in HPI   Brief History:  See HPI.   EMR reviewed including pt PMHx, past surgical history and past visits to ER.   See HPI for more details   Lab Tests:  I ordered and independently interpreted labs.  The pertinent results include:   CBC remarkable for hemoglobin of 6.4, no leukocytosis.  PT and INR are 17.8, manage 4.  Hemoccult is positive.  CMP for worsening creatinine at 2.3, LFTs are within normal limits.  No anion gap.  Lipase level was normal.  Type and screen was also obtained.  Imaging Studies:  CT head was ordered, CT chest  abdomen and pelvis is pending.   Medicines ordered:  I ordered medication including Kcentra   for Eliquis  reversal  Reevaluation of the patient after these medicines showed that the patient stayed the same I have reviewed the patients home medicines and have made adjustments as needed   Critical Interventions:  In the setting of hemoglobin of 6.4, prior history of GI bleed, currently anticoagulated on Eliquis  I do feel that patient meets criteria for reversal of blood thinner via Kcentra .  Pharmacy was consulted.  Consults:  I requested consultation with [facility],  and discussed lab and imaging findings as well as pertinent plan - they recommend: will evaluate patient while in the ED.    Reevaluation:  After the interventions noted above I re-evaluated patient and found that they have :stayed the same  Social Determinants of Health:  The patient's social determinants of health were a factor in the care of this patient   Problem List / ED Course:  Patient presenting to the ED from [facility] facility after being called for abnormal labs.  Was found to have a hemoglobin in the range of 6, reports he has a prior history of GI bleed.  I did perform a Hemoccult with nursing staff at the bedside [facility].  No gross blood was noted on my exam.  Abdomen is soft, nontender to palpation.  He does have a prior history of a liver abscess, prior history of DVT, currently anticoagulated on Eliquis .  CBC with a hemoglobin here today of 6.4, no leukocytosis.  CMP with a worsening creatinine at 2.32, LFTs are within normal limits.  Anion gap is normal.  PT and INR are normal.  His Hemoccult is positive here. I did consult [facility] Maureen S[facility], who will evaluate patient while in the emergency department.  The wife at the bedside pad, tells me that patient suffered a fall on Monday, he does have a prior history of subdural.  She also tells me that since this fall patient's cognitive skills have worsened, he is ANO x 4 for me today.  I will obtain also a CT of his head again. Patient is currently on Eliquis , due to active bleeding, pharmacy has been consulted and Kcentra  was given.   Dispostion:  After consideration of the diagnostic results and the patients response to treatment, I feel that the patent would benefit from admission for further management of his ongoing bleeding.   Portions of this note were generated with Scientist, clinical (histocompatibility and immunogenetics). Dictation errors may occur despite best attempts at proofreading.   Final Clinical Impression(s) / ED Diagnoses Final diagnoses:  Abnormal laboratory test result  Leg swelling    Rx / DC Orders ED Discharge Orders     None          Million Maharaj, PA-C 03/04/24 [id number]    Afton Horse T, DO 03/05/24 2244

## 2024-03-02 NOTE — ED Notes (Signed)
 Updated family that bed assignment is changing will update when new bed assigned.

## 2024-03-02 NOTE — ED Notes (Signed)
 Notified receiving floor of patient being transported to the floor.

## 2024-03-02 NOTE — ED Notes (Signed)
 Per lab, Hgb 6.4, S[place], PA notified.

## 2024-03-03 ENCOUNTER — Observation Stay (HOSPITAL_COMMUNITY)

## 2024-03-03 DIAGNOSIS — I5033 Acute on chronic diastolic (congestive) heart failure: Secondary | ICD-10-CM | POA: Diagnosis not present

## 2024-03-03 DIAGNOSIS — E1122 Type 2 diabetes mellitus with diabetic chronic kidney disease: Secondary | ICD-10-CM | POA: Diagnosis not present

## 2024-03-03 DIAGNOSIS — K922 Gastrointestinal hemorrhage, unspecified: Secondary | ICD-10-CM

## 2024-03-03 DIAGNOSIS — K3189 Other diseases of stomach and duodenum: Secondary | ICD-10-CM | POA: Diagnosis not present

## 2024-03-03 DIAGNOSIS — R195 Other fecal abnormalities: Secondary | ICD-10-CM | POA: Diagnosis not present

## 2024-03-03 DIAGNOSIS — I8500 Esophageal varices without bleeding: Secondary | ICD-10-CM | POA: Diagnosis not present

## 2024-03-03 DIAGNOSIS — R58 Hemorrhage, not elsewhere classified: Secondary | ICD-10-CM | POA: Diagnosis not present

## 2024-03-03 DIAGNOSIS — K5520 Angiodysplasia of colon without hemorrhage: Secondary | ICD-10-CM | POA: Diagnosis not present

## 2024-03-03 DIAGNOSIS — R911 Solitary pulmonary nodule: Secondary | ICD-10-CM

## 2024-03-03 DIAGNOSIS — D649 Anemia, unspecified: Secondary | ICD-10-CM | POA: Diagnosis not present

## 2024-03-03 DIAGNOSIS — Z794 Long term (current) use of insulin: Secondary | ICD-10-CM | POA: Diagnosis not present

## 2024-03-03 DIAGNOSIS — M069 Rheumatoid arthritis, unspecified: Secondary | ICD-10-CM | POA: Diagnosis not present

## 2024-03-03 DIAGNOSIS — N179 Acute kidney failure, unspecified: Secondary | ICD-10-CM

## 2024-03-03 DIAGNOSIS — E781 Pure hyperglyceridemia: Secondary | ICD-10-CM | POA: Diagnosis not present

## 2024-03-03 DIAGNOSIS — E872 Acidosis, unspecified: Secondary | ICD-10-CM | POA: Diagnosis not present

## 2024-03-03 DIAGNOSIS — Z87891 Personal history of nicotine dependence: Secondary | ICD-10-CM | POA: Diagnosis not present

## 2024-03-03 DIAGNOSIS — R197 Diarrhea, unspecified: Secondary | ICD-10-CM | POA: Diagnosis not present

## 2024-03-03 DIAGNOSIS — M7989 Other specified soft tissue disorders: Secondary | ICD-10-CM | POA: Diagnosis not present

## 2024-03-03 DIAGNOSIS — D6832 Hemorrhagic disorder due to extrinsic circulating anticoagulants: Secondary | ICD-10-CM | POA: Diagnosis not present

## 2024-03-03 DIAGNOSIS — K750 Abscess of liver: Secondary | ICD-10-CM | POA: Diagnosis not present

## 2024-03-03 DIAGNOSIS — N1832 Chronic kidney disease, stage 3b: Secondary | ICD-10-CM | POA: Diagnosis not present

## 2024-03-03 DIAGNOSIS — K31819 Angiodysplasia of stomach and duodenum without bleeding: Secondary | ICD-10-CM | POA: Diagnosis not present

## 2024-03-03 DIAGNOSIS — E874 Mixed disorder of acid-base balance: Secondary | ICD-10-CM

## 2024-03-03 DIAGNOSIS — I5021 Acute systolic (congestive) heart failure: Secondary | ICD-10-CM | POA: Diagnosis not present

## 2024-03-03 DIAGNOSIS — M79601 Pain in right arm: Secondary | ICD-10-CM | POA: Diagnosis not present

## 2024-03-03 DIAGNOSIS — K921 Melena: Secondary | ICD-10-CM | POA: Diagnosis not present

## 2024-03-03 DIAGNOSIS — D1779 Benign lipomatous neoplasm of other sites: Secondary | ICD-10-CM | POA: Diagnosis not present

## 2024-03-03 DIAGNOSIS — Z7401 Bed confinement status: Secondary | ICD-10-CM | POA: Diagnosis not present

## 2024-03-03 DIAGNOSIS — Z7901 Long term (current) use of anticoagulants: Secondary | ICD-10-CM | POA: Diagnosis not present

## 2024-03-03 DIAGNOSIS — I5031 Acute diastolic (congestive) heart failure: Secondary | ICD-10-CM

## 2024-03-03 DIAGNOSIS — I110 Hypertensive heart disease with heart failure: Secondary | ICD-10-CM | POA: Diagnosis not present

## 2024-03-03 DIAGNOSIS — W19XXXA Unspecified fall, initial encounter: Secondary | ICD-10-CM | POA: Diagnosis not present

## 2024-03-03 DIAGNOSIS — Z743 Need for continuous supervision: Secondary | ICD-10-CM | POA: Diagnosis not present

## 2024-03-03 DIAGNOSIS — R4182 Altered mental status, unspecified: Secondary | ICD-10-CM | POA: Diagnosis not present

## 2024-03-03 DIAGNOSIS — I130 Hypertensive heart and chronic kidney disease with heart failure and stage 1 through stage 4 chronic kidney disease, or unspecified chronic kidney disease: Secondary | ICD-10-CM | POA: Diagnosis not present

## 2024-03-03 DIAGNOSIS — E11649 Type 2 diabetes mellitus with hypoglycemia without coma: Secondary | ICD-10-CM | POA: Diagnosis not present

## 2024-03-03 DIAGNOSIS — K5521 Angiodysplasia of colon with hemorrhage: Secondary | ICD-10-CM | POA: Diagnosis not present

## 2024-03-03 DIAGNOSIS — N183 Chronic kidney disease, stage 3 unspecified: Secondary | ICD-10-CM | POA: Diagnosis not present

## 2024-03-03 DIAGNOSIS — D62 Acute posthemorrhagic anemia: Secondary | ICD-10-CM | POA: Diagnosis not present

## 2024-03-03 DIAGNOSIS — M25512 Pain in left shoulder: Secondary | ICD-10-CM | POA: Diagnosis not present

## 2024-03-03 DIAGNOSIS — Z66 Do not resuscitate: Secondary | ICD-10-CM | POA: Diagnosis not present

## 2024-03-03 DIAGNOSIS — E16A1 Hypoglycemia level 1: Secondary | ICD-10-CM | POA: Diagnosis not present

## 2024-03-03 DIAGNOSIS — M19012 Primary osteoarthritis, left shoulder: Secondary | ICD-10-CM | POA: Diagnosis not present

## 2024-03-03 DIAGNOSIS — E1169 Type 2 diabetes mellitus with other specified complication: Secondary | ICD-10-CM | POA: Diagnosis not present

## 2024-03-03 DIAGNOSIS — F0390 Unspecified dementia without behavioral disturbance: Secondary | ICD-10-CM | POA: Diagnosis not present

## 2024-03-03 DIAGNOSIS — D175 Benign lipomatous neoplasm of intra-abdominal organs: Secondary | ICD-10-CM | POA: Diagnosis not present

## 2024-03-03 DIAGNOSIS — E8809 Other disorders of plasma-protein metabolism, not elsewhere classified: Secondary | ICD-10-CM | POA: Diagnosis not present

## 2024-03-03 DIAGNOSIS — K6389 Other specified diseases of intestine: Secondary | ICD-10-CM | POA: Diagnosis not present

## 2024-03-03 DIAGNOSIS — E1140 Type 2 diabetes mellitus with diabetic neuropathy, unspecified: Secondary | ICD-10-CM | POA: Diagnosis not present

## 2024-03-03 DIAGNOSIS — Z952 Presence of prosthetic heart valve: Secondary | ICD-10-CM | POA: Diagnosis not present

## 2024-03-03 LAB — URINALYSIS, ROUTINE W REFLEX MICROSCOPIC
Bilirubin Urine: NEGATIVE
Glucose, UA: NEGATIVE mg/dL
Hgb urine dipstick: NEGATIVE
Ketones, ur: NEGATIVE mg/dL
Nitrite: NEGATIVE
Protein, ur: NEGATIVE mg/dL
Specific Gravity, Urine: 1.01 (ref 1.005–1.030)
WBC, UA: >50 WBC/hpf (ref 0–5)
pH: 5 (ref 5.0–8.0)

## 2024-03-03 LAB — GLUCOSE, CAPILLARY
Glucose-Capillary: 65 mg/dL — ABNORMAL LOW (ref 70–99)
Glucose-Capillary: 90 mg/dL (ref 70–99)
Glucose-Capillary: 76 mg/dL (ref 70–99)
Glucose-Capillary: 144 mg/dL — ABNORMAL HIGH (ref 70–99)
Glucose-Capillary: 138 mg/dL — ABNORMAL HIGH (ref 70–99)
Glucose-Capillary: 181 mg/dL — ABNORMAL HIGH (ref 70–99)

## 2024-03-03 LAB — C DIFFICILE QUICK SCREEN W PCR REFLEX
C Diff antigen: NEGATIVE
C Diff interpretation: NOT DETECTED
C Diff toxin: NEGATIVE

## 2024-03-03 LAB — HEMOGLOBIN AND HEMATOCRIT, BLOOD
HCT: 21.5 % — ABNORMAL LOW (ref 39.0–52.0)
HCT: 22.8 % — ABNORMAL LOW (ref 39.0–52.0)
HCT: 22.8 % — ABNORMAL LOW (ref 39.0–52.0)
Hemoglobin: 7.2 g/dL — ABNORMAL LOW (ref 13.0–17.0)
Hemoglobin: 7.6 g/dL — ABNORMAL LOW (ref 13.0–17.0)
Hemoglobin: 7.4 g/dL — ABNORMAL LOW (ref 13.0–17.0)

## 2024-03-03 LAB — BASIC METABOLIC PANEL WITH GFR
Anion gap: 9 (ref 5–15)
BUN: 45 mg/dL — ABNORMAL HIGH (ref 8–23)
CO2: 17 mmol/L — ABNORMAL LOW (ref 22–32)
Calcium: 8.2 mg/dL — ABNORMAL LOW (ref 8.9–10.3)
Chloride: 115 mmol/L — ABNORMAL HIGH (ref 98–111)
Creatinine, Ser: 2.14 mg/dL — ABNORMAL HIGH (ref 0.61–1.24)
GFR, Estimated: 31 mL/min — ABNORMAL LOW (ref >60)
Glucose, Bld: 64 mg/dL — ABNORMAL LOW (ref 70–99)
Potassium: 4.2 mmol/L (ref 3.5–5.1)
Sodium: 141 mmol/L (ref 135–145)

## 2024-03-03 LAB — MAGNESIUM: Magnesium: 1.5 mg/dL — ABNORMAL LOW (ref 1.7–2.4)

## 2024-03-03 LAB — MRSA NEXT GEN BY PCR, NASAL: MRSA by PCR Next Gen: DETECTED — AB

## 2024-03-03 MED ORDER — DEXTROSE 50 % IV SOLN
1.0000 | Freq: Once | INTRAVENOUS | Status: DC
  Filled 2024-03-03: qty 50

## 2024-03-03 MED ORDER — MUPIROCIN 2 % EX OINT
1.0000 | TOPICAL_OINTMENT | Freq: Two times a day (BID) | CUTANEOUS | Status: AC
  Administered 2024-03-03 – 2024-03-07 (×9): 1 via NASAL
  Filled 2024-03-03 (×6): qty 22

## 2024-03-03 MED ORDER — FUROSEMIDE 10 MG/ML IJ SOLN
40.0000 mg | Freq: Two times a day (BID) | INTRAMUSCULAR | Status: DC
  Administered 2024-03-03 – 2024-03-09 (×13): 40 mg via INTRAVENOUS
  Filled 2024-03-03 (×13): qty 4

## 2024-03-03 MED ORDER — CHLORHEXIDINE GLUCONATE CLOTH 2 % EX PADS
6.0000 | MEDICATED_PAD | Freq: Every day | CUTANEOUS | Status: AC
Start: 2024-03-03 — End: 2024-03-08
  Administered 2024-03-03 – 2024-03-06 (×4): 6 via TOPICAL

## 2024-03-03 MED ORDER — MEDIHONEY WOUND/BURN DRESSING EX PSTE
1.0000 | PASTE | Freq: Every day | CUTANEOUS | Status: DC
  Administered 2024-03-03 – 2024-03-09 (×7): 1 via TOPICAL
  Filled 2024-03-03: qty 44

## 2024-03-03 MED ORDER — NYSTATIN 100000 UNIT/GM EX POWD
Freq: Two times a day (BID) | CUTANEOUS | Status: DC
Start: 2024-03-03 — End: 2024-03-09
  Filled 2024-03-03: qty 15

## 2024-03-03 MED ORDER — MAGNESIUM SULFATE 2 GM/50ML IV SOLN
2.0000 g | Freq: Once | INTRAVENOUS | Status: AC
  Administered 2024-03-03: 2 g via INTRAVENOUS
  Filled 2024-03-03: qty 50

## 2024-03-03 MED ORDER — LOPERAMIDE HCL 2 MG PO CAPS
2.0000 mg | ORAL_CAPSULE | ORAL | Status: DC | PRN
  Administered 2024-03-03: 2 mg via ORAL
  Filled 2024-03-03: qty 1

## 2024-03-03 MED ORDER — SODIUM BICARBONATE 8.4 % IV SOLN
50.0000 meq | Freq: Once | INTRAVENOUS | Status: AC
  Administered 2024-03-03: 50 meq via INTRAVENOUS
  Filled 2024-03-03: qty 50

## 2024-03-03 MED ORDER — DIPHENOXYLATE-ATROPINE 2.5-0.025 MG PO TABS
1.0000 | ORAL_TABLET | Freq: Two times a day (BID) | ORAL | Status: DC
  Administered 2024-03-03 – 2024-03-05 (×4): 1 via ORAL
  Filled 2024-03-03 (×5): qty 1

## 2024-03-03 MED ORDER — ZINC OXIDE 40 % EX OINT
TOPICAL_OINTMENT | Freq: Two times a day (BID) | CUTANEOUS | Status: DC
  Filled 2024-03-03: qty 57

## 2024-03-03 MED ORDER — DEXTROSE 50 % IV SOLN: 12.5000 g | INTRAVENOUS | Status: DC | PRN

## 2024-03-03 NOTE — Hospital Course (Addendum)
 78 y.o. male with history h/o HTN, HLP, GERD, colon polyps, hiatal hernia, rheumatoid arthritis, CVA, CKD stage IIIb, aortic stenosis s/p TAVR, recurrent falls with history of subdural hematoma in the past, DVT of right upper extremity on Eliquis , hepatic abscess, chronic anemia, grade I esophageal varices, p/w bilateral leg swelling, dark stools, and worsening weakness.   Assessment and Plan:   Acute on chronic blood loss anemia - Hemoglobin 6.4 on presentation (8.1 last month).  Status post 1 unit PRBCs.  Hemoglobin appears stable now.  Will recheck hemoglobin in AM.  Transfuse if less than 7.   Concern for upper GI bleed -Noted history of varices and reported dark stools.  Guaiac positive on presentation.  Anemia on presentation.  Status post Kcentra  x 1 in the ED.  Anticoagulation on hold.  GI consulted.  S/P small bowel enteroscopy 5/19 with noted grade 1 varices and small nonbleeding angioectasia.  No active bleeding appreciated.  GI stating patient can restart Eliquis .  Patient's wife requesting to discontinue it given patient's multiple bleeding history.  Will concur with patient's wife, will discontinue Eliquis .  GI signing off for the time being.  If rebleeding occurs, may warrant repeat endoscopy.   Acute exacerbation of HFpEF - Noted 3+ BL LE pitting edema on presentation.  Echo last month showed preserved EF.  No previous history of lower extremity edema.  Responding well to IV Lasix  40 mg twice daily.  Monitor I's and O's, 1500 mL fluid restriction.  Net negative nearly 8 L since presentation.  Review of the edema could be related to hypoalbuminemia and liver dysfunction.  Edema improving but not totally resolved.  Continue diuresis.   Acute on chronic diarrhea with non-anion gap metabolic acidosis - CO2 16 on presentation.  Likely GI loss given multiple bowel movements daily.  Due to patient's antibiotic use, C. difficile ordered and subsequently negative.  Responding well to as needed  loperamide .  Received sodium bicarb x 1 amp.  Recheck BMP and mag in AM.  Acidemia appears resolved.     AKI on CKD-3b -Cr above baseline.  Likely cardiorenal etiology.  Diuretics on board.  Will monitor urine output and recheck BMP and mag in am.   Hypomagnesemia - Patient was experiencing cramps.  Magnesium  checked and subsequently found to be low.  Likely exacerbated by diuresis.  Replenishment initiated.  Cramping resolved.  Will recheck magnesium  in AM.   Hepatic abscess (POA) - Noted on previous hospitalization, is on p.o. Cipro  plus Flagyl  (end date 5/22).  Previously scheduled to follow-up with infectious disease on Monday 5/19.  Continue p.o. antibiotics.   History of DVT - Anticoagulation being held secondary to GI bleed above.  Recent history of DVT.  Discussion with patient's wife to discontinue Eliquis .  Doppler ultrasound showed no DVT at this time.   History of AS s/p TAVR -noted.   Hypoglycemia - Appears resolved.

## 2024-03-03 NOTE — Plan of Care (Signed)

## 2024-03-03 NOTE — Progress Notes (Signed)
 Daily Progress Note  DOA: 03/02/2024 Hospital Day: 2   Cc:  Anemia / FOBT+  Brief History:  78 y.o. year old male multiple medical problems no limited to  GERD / hiatal hernia, colon polyps,  rheumatoid arthritis, CVA, CKD stage IIIb, aortic stenosis s/p TAVR, DVT.  Admitted for anemia and reported black stools at living facility. See 5/16 GI consult      ASSESSMENT    Acute on chronic [place] anemia / FOBT+/ reported black stools at living facility per wife who is room today and helps with history.  Hgb 6.4, down from 8 in late April  Inpatient EGD by us  in April 2025 (for anemia) showed small varices in proximal and mid esophagus but no portal gastropathy to suggest portal HTN. Cause for the varices was unclear. Colonoscopy wasn't pursued that admission.  Today: Hgb improved to 8.6 post 1 u RBCs.   Liver abscess ( biopsy 01/18/24). There was originally a concern for malignant mass.  On Cipro  and flagyl . Has appt with ID on Monday   Hx of DVT, ? on Eliquis . Wife says he is NOT on Eliquis    Lung nodule Per CT scan 3/30. Plan was for outpatient follow up   RA, on Embre   PLAN   --Schedule for small bowel enteroscopy. The risks and benefits of enteroscopy with possible biopsies were discussed with the patient and his wife. Patient agrees to proceed. Not yet sure if this will be done tomorrow or Monday --Monitor H/H, BID IV Pantoprazole   Subjective   No complaints today. No BMs / bleeding. Today. Wife visiting  Objective   Recent Labs    03/02/24 1354 03/02/24 2208 03/03/24 0528 03/03/24 0655  WBC 8.2 8.2  --   --   HGB 6.4* 7.1* 7.2* 7.6*  HCT 20.5* 21.6* 21.5* 22.8*  MCV 85.8 82.4  --   --   PLT 262 240  --   --    Recent Labs    03/02/24 1354 03/03/24 0528  NA 139 141  K 4.6 4.2  CL 112* 115*  CO2 16* 17*  GLUCOSE 169* 64*  BUN 47* 45*  CREATININE 2.32* 2.14*  CALCIUM 8.0* 8.2*   Recent Labs    03/02/24 1354  PROT 5.3*  ALBUMIN  2.4*  AST 24   ALT 15  ALKPHOS 51  BILITOT 0.4    Imaging:  CT ABDOMEN PELVIS WO CONTRAST CLINICAL DATA:  Blunt trauma yesterday, fell, with anemia. Looking for retroperitoneal bleed.  EXAM: CT ABDOMEN AND PELVIS WITHOUT CONTRAST  TECHNIQUE: Multidetector CT imaging of the abdomen and pelvis was performed following the standard protocol without IV contrast.  RADIATION DOSE REDUCTION: This exam was performed according to the departmental dose-optimization program which includes automated exposure control, adjustment of the mA and/or kV according to patient size and/or use of iterative reconstruction technique.  COMPARISON:  CT chest, abdomen and pelvis without contrast yesterday at 6:28 p.m., CT abdomen and pelvis without contrast 02/14/2024.  FINDINGS: Lower chest: There are minimal bilateral layering pleural effusions new since yesterday's exam.  There is mild interstitial edema in both lung bases also new, possibly due to fluid overload congestive.  There is increased bilateral lower lobe bronchial thickening which could be bronchitic or congestive. No focal infiltrate is seen.  The heart is slightly enlarged. The cardiac blood pool is less dense than the myocardium consistent with anemia.  There is a TAVR, and calcification in the coronary arteries and mitral ring. Small  hiatal hernia.  Hepatobiliary: There is a 2.2 cm hypodense lesion in hepatic segment 5, evaluated on MRI 01/16/2024 and thought to be infectious, on MRI was about double the size, unchanged since yesterday, was 2.5 cm on 02/14/2024.  There is a stable 1 cm nonspecific hypodensity in segment 8 on 3:20, indeterminate.  Rest of the unenhanced liver is homogeneous. The gallbladder is absent without biliary dilatation.  Pancreas: Unremarkable without contrast.  Spleen: Unremarkable without contrast.  Adrenals/Urinary Tract: No adrenal mass. 2.3 cm Bosniak 1 cyst superior pole left kidney requires no  follow-up imaging.  No other contour deforming abnormality of the unenhanced kidneys is seen. There is no urinary stone or obstruction. No bladder thickening.  Stomach/Bowel: Negative for dilatation or wall thickening. Uncomplicated sigmoid diverticulosis. Normal appendix.  Vascular/Lymphatic: Aortic atherosclerosis. No enlarged abdominal or pelvic lymph nodes.  Reproductive: Prostate is unremarkable.  Other: Bilateral fat hernias. No incarcerated hernia. Trace presacral ascites.  There is no free hemorrhage. Body wall edema again seen in the flanks and thighs.  Musculoskeletal: Bilateral rod and pedicle screw fusion construct T10-S1 is again noted with bilateral sacroiliac joint bolts.  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 Osteopenia.  IMPRESSION: 1. No evidence of retroperitoneal bleed. 2. Minimal bilateral pleural effusions and mild interstitial edema in the lung bases, new since yesterday's exam, possibly due to fluid overload or congestive. 3. Increased bilateral lower lobe bronchial thickening which could be bronchitic or congestive. 4. Mild cardiomegaly with TAVR. Aortic and coronary artery atherosclerosis. 5. 2.2 cm hypodense lesion in hepatic segment 5, evaluated on MRI 01/16/2024 and thought to be infectious, unchanged since yesterday, was 2.5 cm on 02/14/2024. 6. 1 cm nonspecific hypodensity in segment 8 of the liver, unchanged. 7. Trace presacral ascites. 8. Body wall edema in the flanks and thighs. 9. Diverticulosis without evidence of diverticulitis. 10. Osteopenia, postsurgical and degenerative change.  Aortic Atherosclerosis (ICD10-I70.0).  Or  Electronically Signed   By: Denman Fischer M.D.   On: 03/03/2024 07:03     Scheduled inpatient medications:   aspirin  EC  81 mg Oral Daily   B-complex with vitamin C  1 tablet Oral Daily   Chlorhexidine  Gluconate Cloth  6 each Topical Daily   cholecalciferol  2,000 Units Oral  Daily   ciprofloxacin   500 mg Oral BID   cyanocobalamin   1,000 mcg Oral Daily   dextrose   1 ampule Intravenous Once   escitalopram   20 mg Oral Daily   ezetimibe   10 mg Oral Daily   fenofibrate   160 mg Oral Daily   finasteride   5 mg Oral Daily   furosemide   40 mg Intravenous BID   insulin  aspart  0-5 Units Subcutaneous QHS   insulin  aspart  0-9 Units Subcutaneous TID WC   leflunomide   10 mg Oral Daily   magnesium  oxide  400 mg Oral Q lunch   melatonin  5 mg Oral QHS   metoprolol  tartrate  12.5 mg Oral BID   metroNIDAZOLE   500 mg Oral Q8H   mupirocin  ointment  1 Application Nasal BID   nystatin   Topical BID   omega-3 acid ethyl esters  2,000 mg Oral Q lunch   pantoprazole  (PROTONIX ) IV  40 mg Intravenous Q12H   sodium bicarbonate   50 mEq Intravenous Once   traZODone   25 mg Oral QPM   vitamin E   400 Units Oral Daily   Continuous inpatient infusions:  PRN inpatient medications: acetaminophen , albuterol , dextrose , ondansetron  **OR** ondansetron  (ZOFRAN ) IV  Vital  signs in last 24 hours: Temp:  [97.5 F (36.4 C)-98 F (36.7 C)] 97.6 F (36.4 C) (05/17 0810) Pulse Rate:  [66-78] 78 (05/17 0810) Resp:  [14-30] 18 (05/17 0810) BP: (101-139)/(46-105) 127/62 (05/17 0810) SpO2:  [99 %-100 %] 100 % (05/17 0810) Weight:  [409 kg] 108 kg (05/16 2100) Last BM Date : 03/03/24  Intake/Output Summary (Last 24 hours) at 03/03/2024 1434 Last data filed at 03/03/2024 0439 Gross per 24 hour  Intake 425 ml  Output 1000 ml  Net -575 ml    Intake/Output from previous day: 05/16 0701 - 05/17 0700 In: 425 [P.O.:400; I.V.:25] Out: 1000 [Urine:1000] Intake/Output this shift: No intake/output data recorded.   Physical Exam:  General: Alert male in NAD Heart:  Regular rate and rhythm.  Pulmonary: Normal respiratory effort Abdomen: Soft, protuberant, nontender. Normal bowel sounds. Neurologic: Alert and oriented Psych: Pleasant. Cooperative     LOS: 0 days   Tommy Green ,NP  03/03/2024, 2:34 PM

## 2024-03-03 NOTE — Progress Notes (Addendum)
 Progress Note   Patient: Tommy Green ZOX:[id number] DOB: 1945/10/25 DOA: 03/02/2024     0 DOS: the patient was seen and examined on 03/03/2024   Brief hospital course:  78 y.o. male with history h/o HTN, HLP, GERD, colon polyps, hiatal hernia, rheumatoid arthritis, CVA, CKD stage IIIb, aortic stenosis s/p TAVR, recurrent falls with history of subdural hematoma in the past, DVT of right upper extremity on Eliquis , hepatic abscess, chronic anemia, grade I esophageal varices, p/w bilateral leg swelling, dark stools, and worsening weakness.  Assessment and Plan:  Acute on chronic blood loss anemia - Hemoglobin 6.4 on presentation (8.1 last month).  Status post 1 unit PRBCs.  Hemoglobin appears stable now.  Will recheck hemoglobin later this evening and in AM.  Transfuse if less than 7.  Concern for upper GI bleed -Noted history of varices and reported dark stools.  Guaiac positive.  Anemia on presentation.  Status post Kcentra  x 1 in the ED.  Anticoagulation on hold.  PPI twice daily.  GI consulted and following closely.  Holding off on EGD for now.  Acute exacerbation of HFpEF - Noted 3+ BL LE pitting edema.  Echo last month showed preserved EF.  No previous history of lower extremity edema.  Will initiate IV Lasix  40 mg twice daily.  Monitor I's and O's, 1500 mL fluid restriction.  Review of the edema could be related to hypoalbuminemia and liver dysfunction.  Acute on chronic diarrhea with non-anion gap metabolic acidosis - CO2 16 on presentation.  Likely GI loss given multiple bowel movements daily.  Due to patient's antibiotic use, will order C. difficile.  However talking with family, diarrhea appears to be somewhat chronic.  If C. difficile negative, will initiate antimotility agents.  If positive, will initiate p.o. vancomycin/fidaxomicin.  Will order sodium bicarb x 1 amp.  Will check magnesium  level and replenish if low (may help cramps).  Recheck BMP and mag in AM.  AKI on  CKD-3b -Cr above baseline.  Likely cardiorenal etiology.  Diuretics on board.  Will monitor urine output and recheck BMP and mag in am.  Hepatic abscess (POA) - Noted on previous hospitalization, is on p.o. Cipro  plus Flagyl  (end date 5/22).  Scheduled to follow-up with infectious disease on Monday 5/19.  Continue p.o. antibiotics.  History of DVT - Anticoagulation being held secondary to GI bleed above.  Recent  History of AS s/p TAVR -noted.  Hypoglycemia -noted this am.  D50 ordered.  Accuchecks ordered.   Subjective: Patient resting comfortably this morning.  Wife at bedside as well.  Patient himself has little memory of his arrival to the emergency department.  Currently denies any shortness of breath, fever, chills, chest pain, nausea, vomiting, abdominal pain.  Admits to having multiple loose bowel movements.  Denies any current dark or bloody stools.  Feels improved from earlier.  Complaining of left leg cramps.  Updated wife at bedside.  Physical Exam: Vitals:   03/02/24 2130 03/03/24 0017 03/03/24 0511 03/03/24 0810  BP: 139/60 116/62 121/61 127/62  Pulse: 73 69 73 78  Resp: 18 17 17 18   Temp: 97.8 F (36.6 C) 97.8 F (36.6 C) (!) 97.5 F (36.4 C) 97.6 F (36.4 C)  TempSrc:      SpO2: 99% 100% 100% 100%  Weight:      Height:        GENERAL:  Alert, pleasant, no acute distress  HEENT:  EOMI CARDIOVASCULAR:  RRR, no murmurs appreciated RESPIRATORY:  Clear to auscultation,  no wheezing, rales, or rhonchi GASTROINTESTINAL:  Soft, nontender, nondistended EXTREMITIES: 3+ BL LE pitting edema NEURO:  No new focal deficits appreciated SKIN:  No rashes noted PSYCH:  Appropriate mood and affect     Data Reviewed:  No new imaging to review  Labs: CBC: Recent Labs  Lab 03/02/24 1354 03/02/24 2208 03/03/24 0528 03/03/24 0655  WBC 8.2 8.2  --   --   NEUTROABS 5.6  --   --   --   HGB 6.4* 7.1* 7.2* 7.6*  HCT 20.5* 21.6* 21.5* 22.8*  MCV 85.8 82.4  --   --    PLT 262 240  --   --    Basic Metabolic Panel: Recent Labs  Lab 03/02/24 1354 03/03/24 0528  NA 139 141  K 4.6 4.2  CL 112* 115*  CO2 16* 17*  GLUCOSE 169* 64*  BUN 47* 45*  CREATININE 2.32* 2.14*  CALCIUM 8.0* 8.2*   Liver Function Tests: Recent Labs  Lab 03/02/24 1354  AST 24  ALT 15  ALKPHOS 51  BILITOT 0.4  PROT 5.3*  ALBUMIN  2.4*   CBG: Recent Labs  Lab 03/02/24 2132 03/03/24 0808 03/03/24 1047 03/03/24 1205  GLUCAP 128* 65* 90 76    Scheduled Meds:  aspirin  EC  81 mg Oral Daily   B-complex with vitamin C  1 tablet Oral Daily   Chlorhexidine  Gluconate Cloth  6 each Topical Daily   cholecalciferol  2,000 Units Oral Daily   ciprofloxacin   500 mg Oral BID   cyanocobalamin   1,000 mcg Oral Daily   dextrose   1 ampule Intravenous Once   escitalopram   20 mg Oral Daily   ezetimibe   10 mg Oral Daily   fenofibrate   160 mg Oral Daily   finasteride   5 mg Oral Daily   furosemide   40 mg Intravenous BID   insulin  aspart  0-5 Units Subcutaneous QHS   insulin  aspart  0-9 Units Subcutaneous TID WC   leflunomide   10 mg Oral Daily   magnesium  oxide  400 mg Oral Q lunch   melatonin  5 mg Oral QHS   metoprolol  tartrate  12.5 mg Oral BID   metroNIDAZOLE   500 mg Oral Q8H   mupirocin  ointment  1 Application Nasal BID   omega-3 acid ethyl esters  2,000 mg Oral Q lunch   pantoprazole  (PROTONIX ) IV  40 mg Intravenous Q12H   sodium bicarbonate   50 mEq Intravenous Once   traZODone   25 mg Oral QPM   vitamin E   400 Units Oral Daily   Continuous Infusions: PRN Meds:.acetaminophen , albuterol , dextrose , ondansetron  **OR** ondansetron  (ZOFRAN ) IV  Family Communication: Wife at bedside  Disposition: Status is: Observation The patient will require care spanning > 2 midnights and should be moved to inpatient because: Acute blood loss anemia, new CHF exacerbation     Time spent: 50 minutes  Author: Jodeane Mulligan, DO 03/03/2024 1:34 PM  For on call review  [email or web address].

## 2024-03-03 NOTE — Consult Note (Signed)
 WOC Nurse Consult Note: Reason for Consult:L buttock wound  Wound type: Stage 3 Pressure Injury L buttock  Pressure Injury POA: Yes Measurement: see nursing flowsheet  Wound bed:  yellow necrotic tissue  Drainage (amount, consistency, odor) see nursing flowsheet  Periwound:intact  Dressing procedure/placement/frequency:  Cleanse  L buttock wound with NS, apply Medihoney to wound bed daily and cover with dry gauze and silicone foam.    Will order a thin layer of Desitin to surrounding intact skin 2 times daily.    POC discussed with bedside nurse. WOC team will not follow. Re-consult if further needs arise.   Thank you,    Ronni Colace MSN, RN-BC, [employer] [phone]

## 2024-03-04 ENCOUNTER — Encounter (HOSPITAL_COMMUNITY)

## 2024-03-04 DIAGNOSIS — K922 Gastrointestinal hemorrhage, unspecified: Secondary | ICD-10-CM | POA: Diagnosis not present

## 2024-03-04 DIAGNOSIS — I5031 Acute diastolic (congestive) heart failure: Secondary | ICD-10-CM | POA: Diagnosis not present

## 2024-03-04 DIAGNOSIS — N179 Acute kidney failure, unspecified: Secondary | ICD-10-CM | POA: Diagnosis not present

## 2024-03-04 DIAGNOSIS — I8500 Esophageal varices without bleeding: Secondary | ICD-10-CM | POA: Diagnosis not present

## 2024-03-04 DIAGNOSIS — R195 Other fecal abnormalities: Secondary | ICD-10-CM | POA: Diagnosis not present

## 2024-03-04 DIAGNOSIS — K750 Abscess of liver: Secondary | ICD-10-CM | POA: Diagnosis not present

## 2024-03-04 DIAGNOSIS — D649 Anemia, unspecified: Secondary | ICD-10-CM | POA: Diagnosis not present

## 2024-03-04 LAB — GLUCOSE, CAPILLARY
Glucose-Capillary: 149 mg/dL — ABNORMAL HIGH (ref 70–99)
Glucose-Capillary: 103 mg/dL — ABNORMAL HIGH (ref 70–99)
Glucose-Capillary: 201 mg/dL — ABNORMAL HIGH (ref 70–99)

## 2024-03-04 LAB — HEMOGLOBIN AND HEMATOCRIT, BLOOD
HCT: 25.2 % — ABNORMAL LOW (ref 39.0–52.0)
HCT: 23.5 % — ABNORMAL LOW (ref 39.0–52.0)
Hemoglobin: 8 g/dL — ABNORMAL LOW (ref 13.0–17.0)
Hemoglobin: 7.6 g/dL — ABNORMAL LOW (ref 13.0–17.0)

## 2024-03-04 NOTE — H&P (View-Only) (Signed)
 Daily Progress Note  DOA: 03/02/2024 Hospital Day: 3  Cc: Anemia/FOBT positive  Brief History:  78 y.o. year old male with multiple medical problems no limited to  GERD / hiatal hernia, colon polyps,  rheumatoid arthritis, CVA, CKD stage IIIb, aortic stenosis s/p TAVR, CHF, DVT.  Admitted for anemia and reported black stools at living facility. 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  Hgb 6.4, down from 8 in late April  Inpatient EGD by us  in April 2025 (for anemia) showed small varices in proximal and mid esophagus but no portal gastropathy to suggest portal HTN. Cause for the varices was unclear. Colonoscopy wasn't pursued that admission.  Today: Hgb stable at 8.    Acute on chronic HFpEF ?  Undergoing diuresis, on fluid restriction. I+0 actually shows he has a negative fluid balance of 3,3257 since admission. Hypoalbuminemia possibly contributing to edema.   ? Dementia.  Confused today   Liver abscess ( biopsy 01/18/24). There was originally a concern for malignant mass.  On Cipro  and flagyl .    Hx of DVT, ? on Eliquis . Wife says he is NOT on Eliquis    Lung nodule Per CT scan 3/30. Plan was for outpatient follow up   RA, on Embrel     PLAN    --Schedule for small bowel enteroscopy to be done tomorrow. The risks and benefits of enteroscopy with possible biopsies were discussed with the patient and his wife yesterday. Patient agreed to proceed. However, today he is confused and unaware of what procedure I was talking about. I will ask RN to have wife Deatra Face sign the consent. Not yet sure if this will be done tomorrow or Monday --Monitor H/H, BID IV Pantoprazole    Subjective   No complaints. Confused today   Objective   GI Studies:    Recent Labs    03/02/24 1354 03/02/24 2208 03/03/24 0528 03/03/24 0655 03/03/24 1846  WBC 8.2 8.2  --    --   --   HGB 6.4* 7.1* 7.2* 7.6* 7.4*  HCT 20.5* 21.6* 21.5* 22.8* 22.8*  MCV 85.8 82.4  --   --   --   PLT 262 240  --   --   --    No results for input(s): "FOLATE", "VITAMINB12", "FERRITIN", "TIBC", "IRONPCTSAT" in the last 72 hours. Recent Labs    03/02/24 1354 03/03/24 0528  NA 139 141  K 4.6 4.2  CL 112* 115*  CO2 16* 17*  GLUCOSE 169* 64*  BUN 47* 45*  CREATININE 2.32* 2.14*  CALCIUM 8.0* 8.2*   Recent Labs    03/02/24 1354  PROT 5.3*  ALBUMIN  2.4*  AST 24  ALT 15  ALKPHOS 51  BILITOT 0.4     Imaging:  CT ABDOMEN PELVIS WO CONTRAST CLINICAL DATA:  Blunt trauma yesterday, fell, with anemia. Looking for retroperitoneal bleed.  EXAM: CT ABDOMEN AND PELVIS WITHOUT CONTRAST  TECHNIQUE: Multidetector CT imaging of the abdomen and pelvis was performed following the standard protocol without IV contrast.  RADIATION DOSE REDUCTION: This exam was performed according to the departmental dose-optimization program which includes automated exposure control, adjustment of the mA and/or kV according to patient size and/or use of iterative reconstruction technique.  COMPARISON:  CT chest, abdomen and pelvis without contrast yesterday at 6:28 p.m., CT abdomen and pelvis without contrast  02/14/2024.  FINDINGS: Lower chest: There are minimal bilateral layering pleural effusions new since yesterday's exam.  There is mild interstitial edema in both lung bases also new, possibly due to fluid overload congestive.  There is increased bilateral lower lobe bronchial thickening which could be bronchitic or congestive. No focal infiltrate is seen.  The heart is slightly enlarged. The cardiac blood pool is less dense than the myocardium consistent with anemia.  There is a TAVR, and calcification in the coronary arteries and mitral ring. Small hiatal hernia.  Hepatobiliary: There is a 2.2 cm hypodense lesion in hepatic segment 5, evaluated on MRI 01/16/2024 and thought  to be infectious, on MRI was about double the size, unchanged since yesterday, was 2.5 cm on 02/14/2024.  There is a stable 1 cm nonspecific hypodensity in segment 8 on 3:20, indeterminate.  Rest of the unenhanced liver is homogeneous. The gallbladder is absent without biliary dilatation.  Pancreas: Unremarkable without contrast.  Spleen: Unremarkable without contrast.  Adrenals/Urinary Tract: No adrenal mass. 2.3 cm Bosniak 1 cyst superior pole left kidney requires no follow-up imaging.  No other contour deforming abnormality of the unenhanced kidneys is seen. There is no urinary stone or obstruction. No bladder thickening.  Stomach/Bowel: Negative for dilatation or wall thickening. Uncomplicated sigmoid diverticulosis. Normal appendix.  Vascular/Lymphatic: Aortic atherosclerosis. No enlarged abdominal or pelvic lymph nodes.  Reproductive: Prostate is unremarkable.  Other: Bilateral fat hernias. No incarcerated hernia. Trace presacral ascites.  There is no free hemorrhage. Body wall edema again seen in the flanks and thighs.  Musculoskeletal: Bilateral rod and pedicle screw fusion construct T10-S1 is again noted with bilateral sacroiliac joint bolts.  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 Osteopenia.  IMPRESSION: 1. No evidence of retroperitoneal bleed. 2. Minimal bilateral pleural effusions and mild interstitial edema in the lung bases, new since yesterday's exam, possibly due to fluid overload or congestive. 3. Increased bilateral lower lobe bronchial thickening which could be bronchitic or congestive. 4. Mild cardiomegaly with TAVR. Aortic and coronary artery atherosclerosis. 5. 2.2 cm hypodense lesion in hepatic segment 5, evaluated on MRI 01/16/2024 and thought to be infectious, unchanged since yesterday, was 2.5 cm on 02/14/2024. 6. 1 cm nonspecific hypodensity in segment 8 of the liver, unchanged. 7. Trace presacral ascites. 8.  Body wall edema in the flanks and thighs. 9. Diverticulosis without evidence of diverticulitis. 10. Osteopenia, postsurgical and degenerative change.  Aortic Atherosclerosis (ICD10-I70.0).  Or  Electronically Signed   By: Denman Fischer M.D.   On: 03/03/2024 07:03     Scheduled inpatient medications:   aspirin  EC  81 mg Oral Daily   B-complex with vitamin C  1 tablet Oral Daily   Chlorhexidine  Gluconate Cloth  6 each Topical Daily   cholecalciferol  2,000 Units Oral Daily   ciprofloxacin   500 mg Oral BID   cyanocobalamin   1,000 mcg Oral Daily   dextrose   1 ampule Intravenous Once   diphenoxylate-atropine  1 tablet Oral BID   escitalopram   20 mg Oral Daily   ezetimibe   10 mg Oral Daily   fenofibrate   160 mg Oral Daily   finasteride   5 mg Oral Daily   furosemide   40 mg Intravenous BID   insulin  aspart  0-5 Units Subcutaneous QHS   insulin  aspart  0-9 Units Subcutaneous TID WC   leflunomide   10 mg Oral Daily   leptospermum manuka honey  1 Application Topical Daily   liver oil-zinc oxide   Topical BID   magnesium   oxide  400 mg Oral Q lunch   melatonin  5 mg Oral QHS   metoprolol  tartrate  12.5 mg Oral BID   metroNIDAZOLE   500 mg Oral Q8H   mupirocin  ointment  1 Application Nasal BID   nystatin   Topical BID   omega-3 acid ethyl esters  2,000 mg Oral Q lunch   pantoprazole  (PROTONIX ) IV  40 mg Intravenous Q12H   traZODone   25 mg Oral QPM   vitamin E   400 Units Oral Daily   Continuous inpatient infusions:  PRN inpatient medications: acetaminophen , albuterol , dextrose , loperamide, ondansetron  **OR** ondansetron  (ZOFRAN ) IV  Vital signs in last 24 hours: Temp:  [97.5 F (36.4 C)-97.9 F (36.6 C)] 97.5 F (36.4 C) (05/18 0456) Pulse Rate:  [65-76] 65 (05/18 0755) Resp:  [17-18] 18 (05/18 0755) BP: (131-142)/(61-71) 142/63 (05/18 0755) SpO2:  [95 %-99 %] 99 % (05/18 0755) Last BM Date : 03/03/24  Intake/Output Summary (Last 24 hours) at 03/04/2024 0853 Last data filed at  03/04/2024 0505 Gross per 24 hour  Intake 249.31 ml  Output 3000 ml  Net -2750.69 ml    Intake/Output from previous day: 05/17 0701 - 05/18 0700 In: 249.3 [P.O.:200; IV Piggyback:49.3] Out: 3000 [Urine:3000] Intake/Output this shift: No intake/output data recorded.   Physical Exam:  General: Alert male in NAD Heart:  Regular rate and rhythm.  Pulmonary: Normal respiratory effort Abdomen: Soft, nondistended, nontender. Normal bowel sounds. Extremities: 2-3+ BLE edema  Neurologic: Alert, confused Psych: Pleasant. Cooperative    LOS: 1 day   Tommy Green ,NP 03/04/2024, 8:53 AM

## 2024-03-04 NOTE — Plan of Care (Signed)

## 2024-03-04 NOTE — Progress Notes (Signed)
 Daily Progress Note  DOA: 03/02/2024 Hospital Day: 3  Cc: Anemia/FOBT positive  Brief History:  78 y.o. year old male with multiple medical problems no limited to  GERD / hiatal hernia, colon polyps,  rheumatoid arthritis, CVA, CKD stage IIIb, aortic stenosis s/p TAVR, CHF, DVT.  Admitted for anemia and reported black stools at living facility. 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  Hgb 6.4, down from 8 in late April  Inpatient EGD by us  in April 2025 (for anemia) showed small varices in proximal and mid esophagus but no portal gastropathy to suggest portal HTN. Cause for the varices was unclear. Colonoscopy wasn't pursued that admission.  Today: Hgb stable at 8.    Acute on chronic HFpEF ?  Undergoing diuresis, on fluid restriction. I+0 actually shows he has a negative fluid balance of 3,3257 since admission. Hypoalbuminemia possibly contributing to edema.   ? Dementia.  Confused today   Liver abscess ( biopsy 01/18/24). There was originally a concern for malignant mass.  On Cipro  and flagyl .    Hx of DVT, ? on Eliquis . Wife says he is NOT on Eliquis    Lung nodule Per CT scan 3/30. Plan was for outpatient follow up   RA, on Embrel     PLAN    --Schedule for small bowel enteroscopy to be done tomorrow. The risks and benefits of enteroscopy with possible biopsies were discussed with the patient and his wife yesterday. Patient agreed to proceed. However, today he is confused and unaware of what procedure I was talking about. I will ask RN to have wife Deatra Face sign the consent. Not yet sure if this will be done tomorrow or Monday --Monitor H/H, BID IV Pantoprazole    Subjective   No complaints. Confused today   Objective   GI Studies:    Recent Labs    03/02/24 1354 03/02/24 2208 03/03/24 0528 03/03/24 0655 03/03/24 1846  WBC 8.2 8.2  --    --   --   HGB 6.4* 7.1* 7.2* 7.6* 7.4*  HCT 20.5* 21.6* 21.5* 22.8* 22.8*  MCV 85.8 82.4  --   --   --   PLT 262 240  --   --   --    No results for input(s): "FOLATE", "VITAMINB12", "FERRITIN", "TIBC", "IRONPCTSAT" in the last 72 hours. Recent Labs    03/02/24 1354 03/03/24 0528  NA 139 141  K 4.6 4.2  CL 112* 115*  CO2 16* 17*  GLUCOSE 169* 64*  BUN 47* 45*  CREATININE 2.32* 2.14*  CALCIUM 8.0* 8.2*   Recent Labs    03/02/24 1354  PROT 5.3*  ALBUMIN  2.4*  AST 24  ALT 15  ALKPHOS 51  BILITOT 0.4     Imaging:  CT ABDOMEN PELVIS WO CONTRAST CLINICAL DATA:  Blunt trauma yesterday, fell, with anemia. Looking for retroperitoneal bleed.  EXAM: CT ABDOMEN AND PELVIS WITHOUT CONTRAST  TECHNIQUE: Multidetector CT imaging of the abdomen and pelvis was performed following the standard protocol without IV contrast.  RADIATION DOSE REDUCTION: This exam was performed according to the departmental dose-optimization program which includes automated exposure control, adjustment of the mA and/or kV according to patient size and/or use of iterative reconstruction technique.  COMPARISON:  CT chest, abdomen and pelvis without contrast yesterday at 6:28 p.m., CT abdomen and pelvis without contrast  02/14/2024.  FINDINGS: Lower chest: There are minimal bilateral layering pleural effusions new since yesterday's exam.  There is mild interstitial edema in both lung bases also new, possibly due to fluid overload congestive.  There is increased bilateral lower lobe bronchial thickening which could be bronchitic or congestive. No focal infiltrate is seen.  The heart is slightly enlarged. The cardiac blood pool is less dense than the myocardium consistent with anemia.  There is a TAVR, and calcification in the coronary arteries and mitral ring. Small hiatal hernia.  Hepatobiliary: There is a 2.2 cm hypodense lesion in hepatic segment 5, evaluated on MRI 01/16/2024 and thought  to be infectious, on MRI was about double the size, unchanged since yesterday, was 2.5 cm on 02/14/2024.  There is a stable 1 cm nonspecific hypodensity in segment 8 on 3:20, indeterminate.  Rest of the unenhanced liver is homogeneous. The gallbladder is absent without biliary dilatation.  Pancreas: Unremarkable without contrast.  Spleen: Unremarkable without contrast.  Adrenals/Urinary Tract: No adrenal mass. 2.3 cm Bosniak 1 cyst superior pole left kidney requires no follow-up imaging.  No other contour deforming abnormality of the unenhanced kidneys is seen. There is no urinary stone or obstruction. No bladder thickening.  Stomach/Bowel: Negative for dilatation or wall thickening. Uncomplicated sigmoid diverticulosis. Normal appendix.  Vascular/Lymphatic: Aortic atherosclerosis. No enlarged abdominal or pelvic lymph nodes.  Reproductive: Prostate is unremarkable.  Other: Bilateral fat hernias. No incarcerated hernia. Trace presacral ascites.  There is no free hemorrhage. Body wall edema again seen in the flanks and thighs.  Musculoskeletal: Bilateral rod and pedicle screw fusion construct T10-S1 is again noted with bilateral sacroiliac joint bolts.  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 Osteopenia.  IMPRESSION: 1. No evidence of retroperitoneal bleed. 2. Minimal bilateral pleural effusions and mild interstitial edema in the lung bases, new since yesterday's exam, possibly due to fluid overload or congestive. 3. Increased bilateral lower lobe bronchial thickening which could be bronchitic or congestive. 4. Mild cardiomegaly with TAVR. Aortic and coronary artery atherosclerosis. 5. 2.2 cm hypodense lesion in hepatic segment 5, evaluated on MRI 01/16/2024 and thought to be infectious, unchanged since yesterday, was 2.5 cm on 02/14/2024. 6. 1 cm nonspecific hypodensity in segment 8 of the liver, unchanged. 7. Trace presacral ascites. 8.  Body wall edema in the flanks and thighs. 9. Diverticulosis without evidence of diverticulitis. 10. Osteopenia, postsurgical and degenerative change.  Aortic Atherosclerosis (ICD10-I70.0).  Or  Electronically Signed   By: Denman Fischer M.D.   On: 03/03/2024 07:03     Scheduled inpatient medications:   aspirin  EC  81 mg Oral Daily   B-complex with vitamin C  1 tablet Oral Daily   Chlorhexidine  Gluconate Cloth  6 each Topical Daily   cholecalciferol  2,000 Units Oral Daily   ciprofloxacin   500 mg Oral BID   cyanocobalamin   1,000 mcg Oral Daily   dextrose   1 ampule Intravenous Once   diphenoxylate-atropine  1 tablet Oral BID   escitalopram   20 mg Oral Daily   ezetimibe   10 mg Oral Daily   fenofibrate   160 mg Oral Daily   finasteride   5 mg Oral Daily   furosemide   40 mg Intravenous BID   insulin  aspart  0-5 Units Subcutaneous QHS   insulin  aspart  0-9 Units Subcutaneous TID WC   leflunomide   10 mg Oral Daily   leptospermum manuka honey  1 Application Topical Daily   liver oil-zinc oxide   Topical BID   magnesium   oxide  400 mg Oral Q lunch   melatonin  5 mg Oral QHS   metoprolol  tartrate  12.5 mg Oral BID   metroNIDAZOLE   500 mg Oral Q8H   mupirocin  ointment  1 Application Nasal BID   nystatin   Topical BID   omega-3 acid ethyl esters  2,000 mg Oral Q lunch   pantoprazole  (PROTONIX ) IV  40 mg Intravenous Q12H   traZODone   25 mg Oral QPM   vitamin E   400 Units Oral Daily   Continuous inpatient infusions:  PRN inpatient medications: acetaminophen , albuterol , dextrose , loperamide, ondansetron  **OR** ondansetron  (ZOFRAN ) IV  Vital signs in last 24 hours: Temp:  [97.5 F (36.4 C)-97.9 F (36.6 C)] 97.5 F (36.4 C) (05/18 0456) Pulse Rate:  [65-76] 65 (05/18 0755) Resp:  [17-18] 18 (05/18 0755) BP: (131-142)/(61-71) 142/63 (05/18 0755) SpO2:  [95 %-99 %] 99 % (05/18 0755) Last BM Date : 03/03/24  Intake/Output Summary (Last 24 hours) at 03/04/2024 0853 Last data filed at  03/04/2024 0505 Gross per 24 hour  Intake 249.31 ml  Output 3000 ml  Net -2750.69 ml    Intake/Output from previous day: 05/17 0701 - 05/18 0700 In: 249.3 [P.O.:200; IV Piggyback:49.3] Out: 3000 [Urine:3000] Intake/Output this shift: No intake/output data recorded.   Physical Exam:  General: Alert male in NAD Heart:  Regular rate and rhythm.  Pulmonary: Normal respiratory effort Abdomen: Soft, nondistended, nontender. Normal bowel sounds. Extremities: 2-3+ BLE edema  Neurologic: Alert, confused Psych: Pleasant. Cooperative    LOS: 1 day   Mai Schwalbe ,NP 03/04/2024, 8:53 AM

## 2024-03-04 NOTE — Evaluation (Addendum)
 Occupational Therapy Evaluation Patient Details Name: Tommy Green MRN: [id number] DOB: 15-Aug-1946 Today's Date: 03/04/2024   History of Present Illness   Pt is an 78 y/o M presenting to ED on 5/16 with BLE edema and weakness, dark stools, admitted for GI bleed. PMH includes HTN, GERD, colon polyps, hiatal hernia, RA, CVA, CKD IIIB, aortic stenosis s/p TAVR, recurrent falls, subdural hematoma, RUE DVT on Eliquis , hepatic abscess, anemia, esophageal varices.     Clinical Impressions Pt reports using RW for mobility, has some assist for ADLs, pt at SNF PTA. Pt currently needing up to max A for ADLs, min A for bed mobility and min +2 for transfers with RW. Pt with 2/4 DOE after ambulation around bed to chair, but SpO2 in 90s on RA. Pt presenting with impairments listed below, will follow acutely. Patient will benefit from continued inpatient follow up therapy, <3 hours/day to maximize safety/ind with ADL/functional mobility.      If plan is discharge home, recommend the following:   A lot of help with walking and/or transfers;A lot of help with bathing/dressing/bathroom;Assistance with cooking/housework;Assist for transportation;Direct supervision/assist for financial management;Direct supervision/assist for medications management;Help with stairs or ramp for entrance     Functional Status Assessment   Patient has had a recent decline in their functional status and demonstrates the ability to make significant improvements in function in a reasonable and predictable amount of time.     Equipment Recommendations   Other (comment) (defer)     Recommendations for Other Services   PT consult     Precautions/Restrictions   Precautions Precautions: Fall Restrictions Weight Bearing Restrictions Per Provider Order: No     Mobility Bed Mobility Overal bed mobility: Needs Assistance Bed Mobility: Supine to Sit     Supine to sit: Min assist           Transfers Overall transfer level: Needs assistance Equipment used: Rolling walker (2 wheels) Transfers: Sit to/from Stand Sit to Stand: Min assist, +2 physical assistance                  Balance Overall balance assessment: Needs assistance Sitting-balance support: Feet supported Sitting balance-Leahy Scale: Fair     Standing balance support: During functional activity, Reliant on assistive device for balance Standing balance-Leahy Scale: Poor Standing balance comment: reliant on RW support                           ADL either performed or assessed with clinical judgement   ADL Overall ADL's : Needs assistance/impaired Eating/Feeding: Minimal assistance;Sitting   Grooming: Minimal assistance;Sitting   Upper Body Bathing: Moderate assistance;Sitting;Minimal assistance   Lower Body Bathing: Maximal assistance;Sitting/lateral leans   Upper Body Dressing : Minimal assistance;Moderate assistance   Lower Body Dressing: Maximal assistance;Sitting/lateral leans   Toilet Transfer: Minimal assistance;+2 for physical assistance;Ambulation;Regular Toilet;Rolling walker (2 wheels)   Toileting- Clothing Manipulation and Hygiene: Maximal assistance       Functional mobility during ADLs: Minimal assistance;+2 for physical assistance;Rolling walker (2 wheels)       Vision   Vision Assessment?: No apparent visual deficits     Perception Perception: Not tested       Praxis Praxis: Not tested       Pertinent Vitals/Pain Pain Assessment Pain Assessment: No/denies pain     Extremity/Trunk Assessment Upper Extremity Assessment Upper Extremity Assessment: Generalized weakness   Lower Extremity Assessment Lower Extremity Assessment: Generalized weakness   Cervical / Trunk  Assessment Cervical / Trunk Assessment: Normal   Communication Communication Communication: No apparent difficulties   Cognition Arousal: Alert Behavior During Therapy: WFL for  tasks assessed/performed Cognition: Cognition impaired   Orientation impairments: Situation, Place (continues to think he is at [facility]) Awareness: Online awareness impaired Memory impairment (select all impairments): Short-term memory, Working memory                       Following commands: Advertising copywriter Comments   Cueing Techniques: Verbal cues  VSS on RA   Exercises     Shoulder Instructions      Home Living Family/patient expects to be discharged to:: Skilled nursing facility Living Arrangements: Spouse/significant other                               Additional Comments: was at [facility] for rehab PTA. At baseline, lives at home with wife      Prior Functioning/Environment Prior Level of Function : Needs assist;History of Falls (last six months)             Mobility Comments: RW for gait ADLs Comments: assist for ADL    OT Problem List: Decreased strength;Decreased range of motion;Decreased activity tolerance;Impaired balance (sitting and/or standing);Decreased coordination;Decreased cognition;Decreased safety awareness   OT Treatment/Interventions: Self-care/ADL training;Therapeutic exercise;Energy conservation;DME and/or AE instruction;Therapeutic activities;Patient/family education;Balance training      OT Goals(Current goals can be found in the care plan section)   Acute Rehab OT Goals Patient Stated Goal: none stated OT Goal Formulation: With patient Time For Goal Achievement: 03/18/24 Potential to Achieve Goals: Good ADL Goals Pt Will Perform Upper Body Dressing: Independently;sitting Pt Will Perform Lower Body Dressing: Independently;sitting/lateral leans;sit to/from stand Pt Will Transfer to Toilet: Independently;ambulating;regular height toilet Pt Will Perform Tub/Shower Transfer: Tub transfer;Shower transfer;Independently;ambulating   OT Frequency:  Min 2X/week    Co-evaluation PT/OT/SLP  Co-Evaluation/Treatment: Yes Reason for Co-Treatment: Complexity of the patient's impairments (multi-system involvement);Necessary to address cognition/behavior during functional activity;To address functional/ADL transfers   OT goals addressed during session: ADL's and self-care      AM-PAC OT "6 Clicks" Daily Activity     Outcome Measure Help from another person eating meals?: A Little Help from another person taking care of personal grooming?: A Little Help from another person toileting, which includes using toliet, bedpan, or urinal?: A Lot Help from another person bathing (including washing, rinsing, drying)?: A Lot Help from another person to put on and taking off regular upper body clothing?: A Lot Help from another person to put on and taking off regular lower body clothing?: A Lot 6 Click Score: 14   End of Session Equipment Utilized During Treatment: Gait belt;Rolling walker (2 wheels) Nurse Communication: Mobility status  Activity Tolerance: Patient tolerated treatment well Patient left: with call bell/phone within reach;in chair;with chair alarm set  OT Visit Diagnosis: Unsteadiness on feet (R26.81);Other abnormalities of gait and mobility (R26.89);Muscle weakness (generalized) (M62.81)                Time: 1610-9604 OT Time Calculation (min): 13 min Charges:  OT General Charges $OT Visit: 1 Visit OT Evaluation $OT Eval Low Complexity: 1 Low  Benedict Brain, OTD, OTR/L SecureChat Preferred Acute Rehab [phone]    Benedict Brain Koonce 03/04/2024, 12:59 PM

## 2024-03-04 NOTE — Evaluation (Signed)
 Physical Therapy Evaluation Patient Details Name: Tommy Green MRN: [id number] DOB: Sep 03, 1946 Today's Date: 03/04/2024  History of Present Illness  Pt is an 78 y/o M presenting to ED on 5/16 with BLE edema and weakness, dark stools, admitted for GI bleed. PMH includes HTN, GERD, colon polyps, hiatal hernia, RA, CVA, CKD IIIB, aortic stenosis s/p TAVR, recurrent falls, subdural hematoma, RUE DVT on Eliquis , hepatic abscess, anemia, esophageal varices.   Clinical Impression  Pt admitted with above diagnosis. PTA pt was at [facility] for rehab. At baseline, he lives at home with wife mod I.  Pt currently with functional limitations due to the deficits listed below (see PT Problem List). On eval, he required min assist bed mobility, +2 min assist transfers, and min assist +2 safety amb 12' with RW. Deficits noted in strength, balance, cognition, and activity tolerance. Pt oriented to self only. Pt will benefit from acute skilled PT to increase their independence and safety with mobility to allow discharge. Post acute, recommend return to [facility] for further rehab.           If plan is discharge home, recommend the following: A lot of help with walking and/or transfers;A lot of help with bathing/dressing/bathroom   Can travel by private vehicle   Yes    Equipment Recommendations Rolling walker (2 wheels);Wheelchair (measurements PT);Wheelchair cushion (measurements PT)  Recommendations for Other Services       Functional Status Assessment Patient has had a recent decline in their functional status and demonstrates the ability to make significant improvements in function in a reasonable and predictable amount of time.     Precautions / Restrictions Precautions Precautions: Fall Recall of Precautions/Restrictions: Impaired      Mobility  Bed Mobility Overal bed mobility: Needs Assistance Bed Mobility: Supine to Sit     Supine to sit: Min assist, Used rails, HOB elevated           Transfers Overall transfer level: Needs assistance Equipment used: Rolling walker (2 wheels) Transfers: Sit to/from Stand Sit to Stand: Min assist, +2 physical assistance           General transfer comment: multi attempts to power up. Retropulsive with initial stance    Ambulation/Gait Ambulation/Gait assistance: +2 safety/equipment, Min assist Gait Distance (Feet): 12 Feet Assistive device: Rolling walker (2 wheels) Gait Pattern/deviations: Step-through pattern, Decreased stride length Gait velocity: decreased Gait velocity interpretation: <1.8 ft/sec, indicate of risk for recurrent falls   General Gait Details: unsteady gait. Fatigues quickly  Careers information officer     Tilt Bed    Modified Rankin (Stroke Patients Only)       Balance Overall balance assessment: Needs assistance Sitting-balance support: Feet supported, No upper extremity supported Sitting balance-Leahy Scale: Fair     Standing balance support: During functional activity, Reliant on assistive device for balance, Bilateral upper extremity supported Standing balance-Leahy Scale: Poor                               Pertinent Vitals/Pain Pain Assessment Pain Assessment: No/denies pain    Home Living Family/patient expects to be discharged to:: Skilled nursing facility Living Arrangements: Spouse/significant other                 Additional Comments: was at [facility] for rehab PTA. At baseline, lives at home with wife    Prior Function Prior Level of  Function : Needs assist;History of Falls (last six months)             Mobility Comments: RW for gait ADLs Comments: assist for ADL     Extremity/Trunk Assessment   Upper Extremity Assessment Upper Extremity Assessment: Generalized weakness    Lower Extremity Assessment Lower Extremity Assessment: Generalized weakness    Cervical / Trunk Assessment Cervical / Trunk Assessment: Normal   Communication   Communication Communication: No apparent difficulties    Cognition Arousal: Alert Behavior During Therapy: WFL for tasks assessed/performed   PT - Cognitive impairments: Orientation, Awareness, Memory, Attention, Safety/Judgement, Problem solving, Initiation, Sequencing   Orientation impairments: Place, Time, Situation                   PT - Cognition Comments: Continues to think he is at [facility] despite multiple attempts to re-orient Following commands: Intact       Cueing Cueing Techniques: Verbal cues     General Comments General comments (skin integrity, edema, etc.): VSS on RA    Exercises     Assessment/Plan    PT Assessment Patient needs continued PT services  PT Problem List Decreased strength;Decreased balance;Decreased cognition;Decreased mobility;Decreased knowledge of precautions;Decreased activity tolerance;Decreased safety awareness       PT Treatment Interventions Functional mobility training;Balance training;DME instruction;Patient/family education;Gait training;Therapeutic activities;Therapeutic exercise;Cognitive remediation    PT Goals (Current goals can be found in the Care Plan section)  Acute Rehab PT Goals Patient Stated Goal: return to [facility] PT Goal Formulation: With patient Time For Goal Achievement: 03/18/24 Potential to Achieve Goals: Good    Frequency Min 2X/week     Co-evaluation   Reason for Co-Treatment: Complexity of the patient's impairments (multi-system involvement);Necessary to address cognition/behavior during functional activity;To address functional/ADL transfers   OT goals addressed during session: ADL's and self-care       AM-PAC PT "6 Clicks" Mobility  Outcome Measure Help needed turning from your back to your side while in a flat bed without using bedrails?: A Little Help needed moving from lying on your back to sitting on the side of a flat bed without using bedrails?: A Little Help  needed moving to and from a bed to a chair (including a wheelchair)?: A Lot Help needed standing up from a chair using your arms (e.g., wheelchair or bedside chair)?: A Lot Help needed to walk in hospital room?: A Little Help needed climbing 3-5 steps with a railing? : Total 6 Click Score: 14    End of Session Equipment Utilized During Treatment: Gait belt Activity Tolerance: Patient tolerated treatment well Patient left: in chair;with call bell/phone within reach;with chair alarm set Nurse Communication: Mobility status PT Visit Diagnosis: Unsteadiness on feet (R26.81);Muscle weakness (generalized) (M62.81);Difficulty in walking, not elsewhere classified (R26.2);History of falling (Z91.81)    Time: 1610-9604 PT Time Calculation (min) (ACUTE ONLY): 17 min   Charges:   PT Evaluation $PT Eval Moderate Complexity: 1 Mod   PT General Charges $$ ACUTE PT VISIT: 1 Visit         Dorothye Gathers., PT  Office # [phone]   Guadelupe Leech 03/04/2024, 10:22 AM

## 2024-03-04 NOTE — Progress Notes (Signed)
 Progress Note   Patient: Tommy Green ZOX:[id number] DOB: 1946/06/03 DOA: 03/02/2024     1 DOS: the patient was seen and examined on 03/04/2024   Brief hospital course:  78 y.o. male with history h/o HTN, HLP, GERD, colon polyps, hiatal hernia, rheumatoid arthritis, CVA, CKD stage IIIb, aortic stenosis s/p TAVR, recurrent falls with history of subdural hematoma in the past, DVT of right upper extremity on Eliquis , hepatic abscess, chronic anemia, grade I esophageal varices, p/w bilateral leg swelling, dark stools, and worsening weakness.   Assessment and Plan:   Acute on chronic blood loss anemia - Hemoglobin 6.4 on presentation (8.1 last month).  Status post 1 unit PRBCs.  Hemoglobin appears stable now, up to 8.0 this am.  Will recheck hemoglobin later this evening and in AM.  Transfuse if less than 7.   Concern for upper GI bleed -Noted history of varices and reported dark stools.  Guaiac positive.  Anemia on presentation.  Status post Kcentra  x 1 in the ED.  Anticoagulation on hold.  PPI twice daily.  GI consulted and following closely.  Likely to pursue mobile enteroscopy tomorrow.  N.p.o. after midnight.  Acute exacerbation of HFpEF - Noted 3+ BL LE pitting edema on presentation.  Echo last month showed preserved EF.  No previous history of lower extremity edema.  Responding well to IV Lasix  40 mg twice daily.  Monitor I's and O's, 1500 mL fluid restriction.  Diuresed greater than 3 L since yesterday.  Review of the edema could be related to hypoalbuminemia and liver dysfunction.   Acute on chronic diarrhea with non-anion gap metabolic acidosis - CO2 16 on presentation.  Likely GI loss given multiple bowel movements daily.  Due to patient's antibiotic use, C. difficile ordered and subsequently negative.  However talking with family, diarrhea appears to be somewhat chronic.  Will initiate antimotility agents Lomotil twice daily with as needed loperamide.  Received sodium bicarb x 1 amp.   Recheck BMP and mag in AM.   AKI on CKD-3b -Cr above baseline.  Likely cardiorenal etiology.  Diuretics on board.  Will monitor urine output and recheck BMP and mag in am.  Hypomagnesemia - Patient was requiring cramps yesterday.  Magnesium  checked and subsequently found to be low.  Replenishment initiated.  No complaints of cramps this morning.  Will recheck magnesium  in AM.   Hepatic abscess (POA) - Noted on previous hospitalization, is on p.o. Cipro  plus Flagyl  (end date 5/22).  Scheduled to follow-up with infectious disease on Monday 5/19.  Continue p.o. antibiotics.   History of DVT - Anticoagulation being held secondary to GI bleed above.  Recent   History of AS s/p TAVR -noted.   Hypoglycemia -noted this am.  D50 ordered.  Accuchecks ordered.   Subjective: Patient sitting in bedside chair this morning.  Alert but with memory issues which appears to be his baseline.  Denies any shortness of breath, chest pain, nausea, vomiting, abdominal pain.  Noted wrinkles in his lower extremities.  Tolerated diuresis extremely well.  Physical Exam: Vitals:   03/03/24 1716 03/03/24 2029 03/04/24 0456 03/04/24 0755  BP: 131/62 134/61 134/71 (!) 142/63  Pulse: 76 72 66 65  Resp: 18 18 17 18   Temp: 97.9 F (36.6 C) 97.9 F (36.6 C) (!) 97.5 F (36.4 C)   TempSrc:  Oral Oral   SpO2: 95% 96% 98% 99%  Weight:      Height:        GENERAL:  Alert, pleasant, no  acute distress  HEENT:  EOMI CARDIOVASCULAR:  RRR, no murmurs appreciated RESPIRATORY:  Clear to auscultation, no wheezing, rales, or rhonchi GASTROINTESTINAL:  Soft, nontender, nondistended EXTREMITIES: 2+ BL LE pitting edema NEURO:  No new focal deficits appreciated SKIN:  No rashes noted PSYCH:  Appropriate mood and affect    Data Reviewed:  No new imaging to review  Labs: CBC: Recent Labs  Lab 03/02/24 1354 03/02/24 2208 03/03/24 0528 03/03/24 0655 03/03/24 1846 03/04/24 0934  WBC 8.2 8.2  --   --   --   --    NEUTROABS 5.6  --   --   --   --   --   HGB 6.4* 7.1* 7.2* 7.6* 7.4* 8.0*  HCT 20.5* 21.6* 21.5* 22.8* 22.8* 25.2*  MCV 85.8 82.4  --   --   --   --   PLT 262 240  --   --   --   --    Basic Metabolic Panel: Recent Labs  Lab 03/02/24 1354 03/03/24 0528 03/03/24 1429  NA 139 141  --   K 4.6 4.2  --   CL 112* 115*  --   CO2 16* 17*  --   GLUCOSE 169* 64*  --   BUN 47* 45*  --   CREATININE 2.32* 2.14*  --   CALCIUM 8.0* 8.2*  --   MG  --   --  1.5*   Liver Function Tests: Recent Labs  Lab 03/02/24 1354  AST 24  ALT 15  ALKPHOS 51  BILITOT 0.4  PROT 5.3*  ALBUMIN  2.4*   CBG: Recent Labs  Lab 03/03/24 1714 03/03/24 2031 03/03/24 2032 03/04/24 0918 03/04/24 1210  GLUCAP 144* 138* 181* 149* 103*    Scheduled Meds:  aspirin  EC  81 mg Oral Daily   B-complex with vitamin C  1 tablet Oral Daily   Chlorhexidine  Gluconate Cloth  6 each Topical Daily   cholecalciferol  2,000 Units Oral Daily   ciprofloxacin   500 mg Oral BID   cyanocobalamin   1,000 mcg Oral Daily   dextrose   1 ampule Intravenous Once   diphenoxylate-atropine  1 tablet Oral BID   escitalopram   20 mg Oral Daily   ezetimibe   10 mg Oral Daily   fenofibrate   160 mg Oral Daily   finasteride   5 mg Oral Daily   furosemide   40 mg Intravenous BID   insulin  aspart  0-5 Units Subcutaneous QHS   insulin  aspart  0-9 Units Subcutaneous TID WC   leflunomide   10 mg Oral Daily   leptospermum manuka honey  1 Application Topical Daily   liver oil-zinc oxide   Topical BID   magnesium  oxide  400 mg Oral Q lunch   melatonin  5 mg Oral QHS   metoprolol  tartrate  12.5 mg Oral BID   metroNIDAZOLE   500 mg Oral Q8H   mupirocin  ointment  1 Application Nasal BID   nystatin   Topical BID   omega-3 acid ethyl esters  2,000 mg Oral Q lunch   pantoprazole  (PROTONIX ) IV  40 mg Intravenous Q12H   traZODone   25 mg Oral QPM   vitamin E   400 Units Oral Daily   Continuous Infusions: PRN Meds:.acetaminophen , albuterol , dextrose ,  loperamide, ondansetron  **OR** ondansetron  (ZOFRAN ) IV  Family Communication: None at bedside  Disposition: Status is: Inpatient Remains inpatient appropriate because: GI bleed, CHF exacerbation     Time spent: 50 minutes  Author: Jodeane Mulligan, DO 03/04/2024 2:18 PM  For  on call review [email or web address].

## 2024-03-05 ENCOUNTER — Inpatient Hospital Stay (HOSPITAL_COMMUNITY)

## 2024-03-05 ENCOUNTER — Encounter (HOSPITAL_COMMUNITY): Payer: Self-pay | Admitting: Internal Medicine

## 2024-03-05 ENCOUNTER — Encounter (HOSPITAL_COMMUNITY)
Admission: EM | Disposition: A | Discharge status: Skilled Nursing Facility | Payer: Self-pay | Source: Skilled Nursing Facility | Attending: Internal Medicine

## 2024-03-05 DIAGNOSIS — K31819 Angiodysplasia of stomach and duodenum without bleeding: Secondary | ICD-10-CM

## 2024-03-05 DIAGNOSIS — Z87891 Personal history of nicotine dependence: Secondary | ICD-10-CM

## 2024-03-05 DIAGNOSIS — D175 Benign lipomatous neoplasm of intra-abdominal organs: Secondary | ICD-10-CM | POA: Diagnosis not present

## 2024-03-05 DIAGNOSIS — K3189 Other diseases of stomach and duodenum: Secondary | ICD-10-CM | POA: Diagnosis not present

## 2024-03-05 DIAGNOSIS — K5520 Angiodysplasia of colon without hemorrhage: Secondary | ICD-10-CM | POA: Diagnosis not present

## 2024-03-05 DIAGNOSIS — K921 Melena: Secondary | ICD-10-CM | POA: Diagnosis not present

## 2024-03-05 DIAGNOSIS — I5031 Acute diastolic (congestive) heart failure: Secondary | ICD-10-CM | POA: Diagnosis not present

## 2024-03-05 DIAGNOSIS — M7989 Other specified soft tissue disorders: Secondary | ICD-10-CM

## 2024-03-05 DIAGNOSIS — K922 Gastrointestinal hemorrhage, unspecified: Secondary | ICD-10-CM | POA: Diagnosis not present

## 2024-03-05 DIAGNOSIS — I5021 Acute systolic (congestive) heart failure: Secondary | ICD-10-CM

## 2024-03-05 DIAGNOSIS — K750 Abscess of liver: Secondary | ICD-10-CM | POA: Diagnosis not present

## 2024-03-05 DIAGNOSIS — I110 Hypertensive heart disease with heart failure: Secondary | ICD-10-CM

## 2024-03-05 DIAGNOSIS — I8500 Esophageal varices without bleeding: Secondary | ICD-10-CM

## 2024-03-05 DIAGNOSIS — M79601 Pain in right arm: Secondary | ICD-10-CM

## 2024-03-05 DIAGNOSIS — K5521 Angiodysplasia of colon with hemorrhage: Secondary | ICD-10-CM

## 2024-03-05 HISTORY — PX: HOT HEMOSTASIS: SHX5433

## 2024-03-05 HISTORY — PX: BIOPSY OF SKIN SUBCUTANEOUS TISSUE AND/OR MUCOUS MEMBRANE: SHX6741

## 2024-03-05 HISTORY — PX: SCLEROTHERAPY: SHX6841

## 2024-03-05 HISTORY — PX: ENTEROSCOPY: SHX5533

## 2024-03-05 LAB — COMPREHENSIVE METABOLIC PANEL WITH GFR
ALT: 53 U/L — ABNORMAL HIGH (ref 0–44)
AST: 215 U/L — ABNORMAL HIGH (ref 15–41)
Albumin: 2.4 g/dL — ABNORMAL LOW (ref 3.5–5.0)
Alkaline Phosphatase: 135 U/L — ABNORMAL HIGH (ref 38–126)
Anion gap: 7 (ref 5–15)
BUN: 38 mg/dL — ABNORMAL HIGH (ref 8–23)
CO2: 21 mmol/L — ABNORMAL LOW (ref 22–32)
Calcium: 8.8 mg/dL — ABNORMAL LOW (ref 8.9–10.3)
Chloride: 109 mmol/L (ref 98–111)
Creatinine, Ser: 2.16 mg/dL — ABNORMAL HIGH (ref 0.61–1.24)
GFR, Estimated: 30 mL/min — ABNORMAL LOW (ref >60)
Glucose, Bld: 105 mg/dL — ABNORMAL HIGH (ref 70–99)
Potassium: 4.5 mmol/L (ref 3.5–5.1)
Sodium: 137 mmol/L (ref 135–145)
Total Bilirubin: 1.7 mg/dL — ABNORMAL HIGH (ref 0.0–1.2)
Total Protein: 5.6 g/dL — ABNORMAL LOW (ref 6.5–8.1)

## 2024-03-05 LAB — CBC
HCT: 24.8 % — ABNORMAL LOW (ref 39.0–52.0)
Hemoglobin: 8.1 g/dL — ABNORMAL LOW (ref 13.0–17.0)
MCH: 27 pg (ref 26.0–34.0)
MCHC: 32.7 g/dL (ref 30.0–36.0)
MCV: 82.7 fL (ref 80.0–100.0)
Platelets: 256 10*3/uL (ref 150–400)
RBC: 3 MIL/uL — ABNORMAL LOW (ref 4.22–5.81)
RDW: 18.4 % — ABNORMAL HIGH (ref 11.5–15.5)
WBC: 12.7 10*3/uL — ABNORMAL HIGH (ref 4.0–10.5)
nRBC: 0 % (ref 0.0–0.2)

## 2024-03-05 LAB — HEMOGLOBIN AND HEMATOCRIT, BLOOD
HCT: 24.6 % — ABNORMAL LOW (ref 39.0–52.0)
HCT: 24.5 % — ABNORMAL LOW (ref 39.0–52.0)
Hemoglobin: 7.8 g/dL — ABNORMAL LOW (ref 13.0–17.0)
Hemoglobin: 7.9 g/dL — ABNORMAL LOW (ref 13.0–17.0)

## 2024-03-05 LAB — PROTIME-INR
INR: 1.2 (ref 0.8–1.2)
Prothrombin Time: 15.9 s — ABNORMAL HIGH (ref 11.4–15.2)

## 2024-03-05 LAB — GLUCOSE, CAPILLARY
Glucose-Capillary: 90 mg/dL (ref 70–99)
Glucose-Capillary: 97 mg/dL (ref 70–99)
Glucose-Capillary: 142 mg/dL — ABNORMAL HIGH (ref 70–99)
Glucose-Capillary: 195 mg/dL — ABNORMAL HIGH (ref 70–99)
Glucose-Capillary: 220 mg/dL — ABNORMAL HIGH (ref 70–99)

## 2024-03-05 LAB — MAGNESIUM: Magnesium: 1.7 mg/dL (ref 1.7–2.4)

## 2024-03-05 SURGERY — ENTEROSCOPY
Anesthesia: Monitor Anesthesia Care

## 2024-03-05 MED ORDER — PROPOFOL 10 MG/ML IV BOLUS
INTRAVENOUS | Status: DC | PRN
  Administered 2024-03-05: 30 mg via INTRAVENOUS
  Administered 2024-03-05 (×2): 10 mg via INTRAVENOUS
  Administered 2024-03-05: 30 mg via INTRAVENOUS

## 2024-03-05 MED ORDER — EPHEDRINE SULFATE (PRESSORS) 50 MG/ML IJ SOLN
INTRAMUSCULAR | Status: DC | PRN
Start: 2024-03-05 — End: 2024-03-05
  Administered 2024-03-05: 10 mg via INTRAVENOUS

## 2024-03-05 MED ORDER — SPOT INK MARKER SYRINGE KIT
PACK | SUBMUCOSAL | Status: DC | PRN
  Administered 2024-03-05: 4 mL via SUBMUCOSAL

## 2024-03-05 MED ORDER — SODIUM CHLORIDE 0.9 % IV SOLN
INTRAVENOUS | Status: AC | PRN
  Administered 2024-03-05: 250 mL via INTRAVENOUS

## 2024-03-05 MED ORDER — LIDOCAINE 2% (20 MG/ML) 5 ML SYRINGE
INTRAMUSCULAR | Status: DC | PRN
  Administered 2024-03-05: 80 mg via INTRAVENOUS

## 2024-03-05 MED ORDER — PROPOFOL 500 MG/50ML IV EMUL
INTRAVENOUS | Status: DC | PRN
  Administered 2024-03-05: 150 ug/kg/min via INTRAVENOUS

## 2024-03-05 MED ORDER — PHENYLEPHRINE HCL (PRESSORS) 10 MG/ML IV SOLN
INTRAVENOUS | Status: DC | PRN
Start: 2024-03-05 — End: 2024-03-05
  Administered 2024-03-05: 160 ug via INTRAVENOUS
  Administered 2024-03-05 (×4): 80 ug via INTRAVENOUS

## 2024-03-05 MED ORDER — METRONIDAZOLE 500 MG PO TABS
ORAL_TABLET | ORAL | Status: AC
  Filled 2024-03-05: qty 1

## 2024-03-05 MED ORDER — SPOT INK MARKER SYRINGE KIT
PACK | SUBMUCOSAL | Status: AC
  Filled 2024-03-05: qty 5

## 2024-03-05 NOTE — Op Note (Signed)
 [facility] Patient Name: Tommy Green Procedure Date : 03/05/2024 MRN: [id number] Attending MD: Jaye Mettle. Cherryl Corona , MD, [id number] Date of Birth: September 16, 1946 CSN: [id number] Age: 78 Admit Type: Inpatient Procedure:                Small bowel enteroscopy Indications:              Melena, anemia Providers:                Geralyn Knee E. Cherryl Corona, MD, Jacquelyn "Jaci" Bernetta Brilliant, RN,                            Kimberly Penna Mbumina, Technician Referring MD:              Medicines:                Monitored Anesthesia Care Complications:            No immediate complications. Estimated Blood Loss:     Estimated blood loss was minimal. Procedure:                Pre-Anesthesia Assessment:                           - Prior to the procedure, a History and Physical                            was performed, and patient medications and                            allergies were reviewed. The patient's tolerance of                            previous anesthesia was also reviewed. The risks                            and benefits of the procedure and the sedation                            options and risks were discussed with the patient.                            All questions were answered, and informed consent                            was obtained. Prior Anticoagulants: The patient has                            taken Eliquis  (apixaban ), last dose was 3 days                            prior to procedure. ASA Grade Assessment: III - A                            patient with severe systemic disease. After  reviewing the risks and benefits, the patient was                            deemed in satisfactory condition to undergo the                            procedure.                           After obtaining informed consent, the endoscope was                            passed under direct vision. Throughout the                            procedure, the patient's  blood pressure, pulse, and                            oxygen saturations were monitored continuously. The                            PCF-190TL ([id number] Olympus colonoscope was                            introduced through the mouth and advanced to the                            jejunum, to the 160 cm mark (from the incisors).                            The small bowel enteroscopy was accomplished                            without difficulty. The patient tolerated the                            procedure well. Scope In: Scope Out: Findings:      Grade I varices were found in the proximal esophagus. They were medium       in size.      The exam of the esophagus was otherwise normal.      Diffuse granular mucosa was found in the entire examined stomach.      The exam of the stomach was otherwise normal.      There was no evidence of significant pathology in the entire examined       duodenum.      A single angioectasia with no bleeding was found in the proximal       jejunum. Coagulation for hemostasis using argon plasma was successful.       Estimated blood loss: none.      Several small yellowish submucosal lesions with no bleeding was found in       the mid-jejunum and in the proximal jejunum. Biopsies were taken of the       largest lesion with a cold forceps for histology. Soft yellowish       material extruded from the biopsy site. Estimated  blood loss was minimal.      The distal extent of the exam was tattooed with an injection of 2 mL of       Spot (carbon black). Following the injection, the endoscope was able to       be advanced an additional 20 cm, so a second tattoo using 2 mL of Spot       was placed at this point (160cm) Estimated blood loss was minimal. Impression:               - Proximal grade I downhill esophageal varices.                            These are not thought to be secondary cirrhosis and                            appear stable from 2021. No bleeding  stigmata                            present. Unlikely to be source of occult GI                            bleeding. He has previously been imaged and no VTE                            found.                           - Granular gastric mucosa.                           - Normal examined duodenum.                           - A single non-bleeding angioectasia in the                            jejunum. Treated with argon plasma coagulation                            (APC).                           - Multiple yellowish lesions in the jejunum.                            Biopsied. These were consistent with lipomas.                           - Extend of exam at 160 cm from the incisors was                            tattooed.                           - Suspect patient bled from intestinal AVMs.  Although only one diminutive AVM identified on this                            exam, it is possible he has additional lesions                            deeper in the small intestine or colon. Moderate Sedation:      N/A Recommendation:           - Return patient to hospital ward for ongoing care.                           - Resume previous diet.                           - Resume Eliquis  (apixaban ) at prior dose tomorrow,                            although also consider risks/benefits of ongoing                            anticoagulation given recurrent bleeding and                            declining functional status.                           - Continue to trend CBC daily and monitor stool                            appearance.                           - Consider colonoscopy vs. VCE if evidence of                            ongoing bleeding. Procedure Code(s):        --- Professional ---                           [phone], 59, Small intestinal endoscopy, enteroscopy                            beyond second portion of duodenum, not including                             ileum; with control of bleeding (eg, injection,                            bipolar cautery, unipolar cautery, laser, heater                            probe, stapler, plasma coagulator)                           44361, Small intestinal endoscopy, enteroscopy  beyond second portion of duodenum, not including                            ileum; with biopsy, single or multiple                           44799, Unlisted procedure, small intestine Diagnosis Code(s):        --- Professional ---                           I85.00, Esophageal varices without bleeding                           K31.89, Other diseases of stomach and duodenum                           K55.20, Angiodysplasia of colon without hemorrhage                           K63.89, Other specified diseases of intestine                           K92.1, Melena (includes Hematochezia) CPT copyright 2022 American Medical Association. All rights reserved. The codes documented in this report are preliminary and upon coder review may  be revised to meet current compliance requirements. Oddis Westling E. Cherryl Corona, MD 03/05/2024 9:01:06 AM This report has been signed electronically. Number of Addenda: 0

## 2024-03-05 NOTE — TOC Progression Note (Signed)
 Transition of Care [facility]) - Progression Note    Patient Details  Name: Tommy Green MRN: [id number] Date of Birth: 29-Aug-1946  [facility] CM/SW Contact  Maika Kaczmarek A [place], LCSW Phone Number: 03/05/2024, 11:28 AM  Clinical Narrative:      CSW spoke with [name] at [facility], plan was for pt to go to LTC section of [facility] with Brent Cambric POC for that section, contact [phone].   Pt needs authorization of rehab is recommended at discharge but if going back under LTC, CSW was informed that it would not be needed.    TOC will continue to follow.       Expected Discharge Plan and Services                                               Social Determinants of Health (SDOH) Interventions SDOH Screenings   Food Insecurity: No Food Insecurity (03/02/2024)  Housing: Low Risk  (03/02/2024)  Transportation Needs: No Transportation Needs (03/02/2024)  Utilities: Not At Risk (03/02/2024)  Alcohol Screen: Low Risk  (11/04/2023)  Depression (PHQ2-9): Low Risk  (01/03/2024)  Financial Resource Strain: Low Risk  (11/04/2023)  Physical Activity: Inactive (11/04/2023)  Social Connections: Moderately Integrated (03/02/2024)  Stress: No Stress Concern Present (11/04/2023)  Tobacco Use: Medium Risk (03/05/2024)  Health Literacy: Adequate Health Literacy (11/04/2023)    Readmission Risk Interventions    01/16/2024   12:29 PM  Readmission Risk Prevention Plan  Transportation Screening Complete  PCP or Specialist Appt within 5-7 Days Complete  Home Care Screening Complete  Medication Review (RN CM) Complete

## 2024-03-05 NOTE — Progress Notes (Signed)
 RUE and BLE venous duplex exams have been completed.   Results can be found under chart review under CV PROC. 03/05/2024 1:27 PM Jehad Bisono RVT, RDMS

## 2024-03-05 NOTE — Anesthesia Preprocedure Evaluation (Addendum)
 Anesthesia Evaluation  Patient identified by MRN, date of birth, ID band Patient awake    Reviewed: Allergy & Precautions, NPO status , Patient's Chart, lab work & pertinent test results, reviewed documented beta blocker date and time   History of Anesthesia Complications Negative for: history of anesthetic complications  Airway Mallampati: II  TM Distance: >3 FB Neck ROM: Full    Dental  (+) Missing,    Pulmonary former smoker   Pulmonary exam normal        Cardiovascular hypertension, Pt. on medications and Pt. on home beta blockers Normal cardiovascular exam  Severe AS s/p TAVR  TTE 01/20/24: EF 55-60%,mild LVH, grade I DD, mild to moderate LAE, mild MS, bioprosthetic aortic valve s/p TAVR with 29 mm Edwards Sapien THV    Neuro/Psych    Depression    CVA, No Residual Symptoms    GI/Hepatic Neg liver ROS, hiatal hernia,GERD  Medicated,,  Endo/Other  diabetes, Type 2, Insulin  Dependent    Renal/GU Renal InsufficiencyRenal disease     Musculoskeletal  (+) Arthritis ,    Abdominal   Peds  Hematology  (+) Blood dyscrasia (Hgb 8.1), anemia   Anesthesia Other Findings Day of surgery medications reviewed with patient.  Reproductive/Obstetrics                              Anesthesia Physical Anesthesia Plan  ASA: 3  Anesthesia Plan: MAC   Post-op Pain Management: Minimal or no pain anticipated   Induction:   PONV Risk Score and Plan: 1 and Treatment may vary due to age or medical condition and Propofol  infusion  Airway Management Planned: Natural Airway and Nasal Cannula  Additional Equipment: None  Intra-op Plan:   Post-operative Plan:   Informed Consent: I have reviewed the patients History and Physical, chart, labs and discussed the procedure including the risks, benefits and alternatives for the proposed anesthesia with the patient or authorized representative who has indicated  his/her understanding and acceptance.   Patient has DNR.  Discussed DNR with patient.     Plan Discussed with: CRNA  Anesthesia Plan Comments: (Discussed DNR with patient. He declines chest compressions in event of cardiac arrest but is amenable to defibrillation and intubation if needed. Jarrell Merritts, MD)         Anesthesia Quick Evaluation

## 2024-03-05 NOTE — Transfer of Care (Signed)
 Immediate Anesthesia Transfer of Care Note  Patient: Tommy Green  Procedure(s) Performed: ENTEROSCOPY SCLEROTHERAPY EGD, WITH ARGON PLASMA COAGULATION BIOPSY, SKIN, SUBCUTANEOUS TISSUE, OR MUCOUS MEMBRANE  Patient Location: Endoscopy Unit  Anesthesia Type:MAC  Level of Consciousness: drowsy  Airway & Oxygen Therapy: Patient Spontanous Breathing and Patient connected to nasal cannula oxygen  Post-op Assessment: Report given to RN and Post -op Vital signs reviewed and stable  Post vital signs: Reviewed and stable  Last Vitals:  Vitals Value Taken Time  BP 92/54 03/05/24 0846  Temp 36.2 C 03/05/24 0846  Pulse 58 03/05/24 0846  Resp 13 03/05/24 0846  SpO2 96 % 03/05/24 0846  Vitals shown include unfiled device data.  Last Pain:  Vitals:   03/05/24 0846  TempSrc: Temporal  PainSc: Asleep         Complications: No notable events documented.

## 2024-03-05 NOTE — Anesthesia Postprocedure Evaluation (Signed)
 Anesthesia Post Note  Patient: Oz Gammel Nader  Procedure(s) Performed: ENTEROSCOPY SCLEROTHERAPY EGD, WITH ARGON PLASMA COAGULATION BIOPSY, SKIN, SUBCUTANEOUS TISSUE, OR MUCOUS MEMBRANE     Patient location during evaluation: PACU Anesthesia Type: MAC Level of consciousness: awake and alert Pain management: pain level controlled Vital Signs Assessment: post-procedure vital signs reviewed and stable Respiratory status: spontaneous breathing, nonlabored ventilation and respiratory function stable Cardiovascular status: blood pressure returned to baseline Postop Assessment: no apparent nausea or vomiting Anesthetic complications: no   No notable events documented.  Last Vitals:  Vitals:   03/05/24 0900 03/05/24 0910  BP: (!) 118/59 132/64  Pulse: 63 63  Resp: 16 12  Temp:    SpO2: 95% 93%    Last Pain:  Vitals:   03/05/24 0910  TempSrc:   PainSc: 0-No pain                 Rayfield Cairo

## 2024-03-05 NOTE — Interval H&P Note (Signed)
 History and Physical Interval Note:  03/05/2024 7:47 AM  Tommy Green  has presented today for surgery, with the diagnosis of Anemia, hemoccult positive stool.  The various methods of treatment have been discussed with the patient and family. After consideration of risks, benefits and other options for treatment, the patient has consented to  Procedure(s): ENTEROSCOPY (N/A) as a surgical intervention.  The patient's history has been reviewed, patient examined, no change in status, stable for surgery.  I have reviewed the patient's chart and labs.  Questions were answered to the patient's satisfaction.  I also spoke with the patient's wife, Tommy Green and explained again the details and rationale for the procedure.  She indicated understanding and willingness to proceed.   Elois Hair

## 2024-03-05 NOTE — Progress Notes (Signed)
 Progress Note   Patient: Tommy Green Tracz ZOX:[id number] DOB: 20-Mar-1946 DOA: 03/02/2024     2 DOS: the patient was seen and examined on 03/05/2024   Brief hospital course:  78 y.o. male with history h/o HTN, HLP, GERD, colon polyps, hiatal hernia, rheumatoid arthritis, CVA, CKD stage IIIb, aortic stenosis s/p TAVR, recurrent falls with history of subdural hematoma in the past, DVT of right upper extremity on Eliquis , hepatic abscess, chronic anemia, grade I esophageal varices, p/w bilateral leg swelling, dark stools, and worsening weakness.   Assessment and Plan:   Acute on chronic blood loss anemia - Hemoglobin 6.4 on presentation (8.1 last month).  Status post 1 unit PRBCs.  Hemoglobin appears stable now, up to 8.1 this am.  Will recheck hemoglobin later this evening and in AM.  Transfuse if less than 7.   Concern for upper GI bleed -Noted history of varices and reported dark stools.  Guaiac positive.  Anemia on presentation.  Status post Kcentra  x 1 in the ED.  Anticoagulation on hold.  PPI twice daily.  GI consulted and following closely.  S/P small bowel enteroscopy this am.  Noted grade 1 varices and small nonbleeding angioectasia.  Can resume eliquis  tomorrow.   Acute exacerbation of HFpEF - Noted 3+ BL LE pitting edema on presentation.  Echo last month showed preserved EF.  No previous history of lower extremity edema.  Responding well to IV Lasix  40 mg twice daily.  Monitor I's and O's, 1500 mL fluid restriction.  Diuresed greater than 4.5 L since presentation.  Review of the edema could be related to hypoalbuminemia and liver dysfunction.   Acute on chronic diarrhea with non-anion gap metabolic acidosis - CO2 16 on presentation.  Likely GI loss given multiple bowel movements daily.  Due to patient's antibiotic use, C. difficile ordered and subsequently negative.  However talking with family, diarrhea appears to be somewhat chronic.  Responding well to antimotility agents Lomotil  twice  daily with as needed loperamide .  Received sodium bicarb x 1 amp.  Recheck BMP and mag in AM.  Acidemia appears resolved.  Will DC lomotil .  Will recheck BMP in am.    AKI on CKD-3b -Cr above baseline.  Likely cardiorenal etiology.  Diuretics on board.  Will monitor urine output and recheck BMP and mag in am.   Hypomagnesemia - Patient was requiring cramps yesterday.  Magnesium  checked and subsequently found to be low.  Replenishment initiated.  Cramping resolved.  Will recheck magnesium  in AM.   Hepatic abscess (POA) - Noted on previous hospitalization, is on p.o. Cipro  plus Flagyl  (end date 5/22).  Scheduled to follow-up with infectious disease on Monday 5/19.  Continue p.o. antibiotics.   History of DVT - Anticoagulation being held secondary to GI bleed above.  Recent   History of AS s/p TAVR -noted.   Hypoglycemia -noted this am.  D50 ordered.  Accuchecks ordered.     Subjective: Patient just returned from small bowel enteroscopy.  Alert, talkative, denies any fever, chills, chest pain, shortness of breath, nausea, vomiting, abdominal pain.  No diarrhea.  Urine output excellent.  No noted bleeding.  Physical Exam: Vitals:   03/05/24 0850 03/05/24 0900 03/05/24 0910 03/05/24 1205  BP: (!) 100/53 (!) 118/59 132/64 (!) 142/66  Pulse: (!) 57 63 63 62  Resp: 13 16 12 19   Temp:    98.8 F (37.1 C)  TempSrc:      SpO2: 96% 95% 93% 97%  Weight:  Height:        GENERAL:  Alert, pleasant, no acute distress  HEENT:  EOMI CARDIOVASCULAR:  RRR, no murmurs appreciated RESPIRATORY:  Clear to auscultation, no wheezing, rales, or rhonchi GASTROINTESTINAL:  Soft, nontender, nondistended EXTREMITIES: 1-2+ BL LE pitting edema NEURO:  No new focal deficits appreciated SKIN:  No rashes noted PSYCH:  Appropriate mood and affect    Data Reviewed:  No new imaging to review at this time  Labs: CBC: Recent Labs  Lab 03/02/24 1354 03/02/24 2208 03/03/24 0528 03/03/24 1846  03/04/24 0934 03/04/24 1813 03/05/24 0409 03/05/24 1025  WBC 8.2 8.2  --   --   --   --  12.7*  --   NEUTROABS 5.6  --   --   --   --   --   --   --   HGB 6.4* 7.1*   < > 7.4* 8.0* 7.6* 8.1* 7.8*  HCT 20.5* 21.6*   < > 22.8* 25.2* 23.5* 24.8* 24.6*  MCV 85.8 82.4  --   --   --   --  82.7  --   PLT 262 240  --   --   --   --  256  --    < > = values in this interval not displayed.   Basic Metabolic Panel: Recent Labs  Lab 03/02/24 1354 03/03/24 0528 03/03/24 1429 03/05/24 0409  NA 139 141  --  137  K 4.6 4.2  --  4.5  CL 112* 115*  --  109  CO2 16* 17*  --  21*  GLUCOSE 169* 64*  --  105*  BUN 47* 45*  --  38*  CREATININE 2.32* 2.14*  --  2.16*  CALCIUM 8.0* 8.2*  --  8.8*  MG  --   --  1.5* 1.7   Liver Function Tests: Recent Labs  Lab 03/02/24 1354 03/05/24 0409  AST 24 215*  ALT 15 53*  ALKPHOS 51 135*  BILITOT 0.4 1.7*  PROT 5.3* 5.6*  ALBUMIN  2.4* 2.4*   CBG: Recent Labs  Lab 03/04/24 1210 03/04/24 1820 03/05/24 0748 03/05/24 0939 03/05/24 1208  GLUCAP 103* 201* 90 97 142*    Scheduled Meds:  aspirin  EC  81 mg Oral Daily   B-complex with vitamin C  1 tablet Oral Daily   Chlorhexidine  Gluconate Cloth  6 each Topical Daily   cholecalciferol   2,000 Units Oral Daily   ciprofloxacin   500 mg Oral BID   cyanocobalamin   1,000 mcg Oral Daily   dextrose   1 ampule Intravenous Once   diphenoxylate -atropine   1 tablet Oral BID   escitalopram   20 mg Oral Daily   ezetimibe   10 mg Oral Daily   fenofibrate   160 mg Oral Daily   finasteride   5 mg Oral Daily   furosemide   40 mg Intravenous BID   insulin  aspart  0-5 Units Subcutaneous QHS   insulin  aspart  0-9 Units Subcutaneous TID WC   leflunomide   10 mg Oral Daily   leptospermum manuka honey  1 Application Topical Daily   liver oil-zinc  oxide   Topical BID   magnesium  oxide  400 mg Oral Q lunch   melatonin  5 mg Oral QHS   metoprolol  tartrate  12.5 mg Oral BID   metroNIDAZOLE   500 mg Oral Q8H   mupirocin   ointment  1 Application Nasal BID   nystatin    Topical BID   omega-3 acid ethyl esters  2,000 mg Oral Q lunch  pantoprazole  (PROTONIX ) IV  40 mg Intravenous Q12H   traZODone   25 mg Oral QPM   vitamin E   400 Units Oral Daily   Continuous Infusions: PRN Meds:.acetaminophen , albuterol , dextrose , loperamide , ondansetron  **OR** ondansetron  (ZOFRAN ) IV  Family Communication: None at bedside  Disposition: Status is: Inpatient Remains inpatient appropriate because: GI bleed, HFpEF     Time spent: 49 minutes  Author: Jodeane Mulligan, DO 03/05/2024 12:50 PM  For on call review [email or web address].

## 2024-03-05 NOTE — Progress Notes (Signed)
 Physical Therapy Treatment Patient Details Name: Tommy Green MRN: [id number] DOB: [birth year] Today's Date: 03/05/2024   History of Present Illness Pt is an 78 y/o M presenting to ED on 5/16 with BLE edema and weakness, dark stools, admitted for GI bleed. S/P small bowel enteroscopy5/19 AM showing "Noted grade 1 varices and small nonbleeding angioectasia". PMH includes HTN, GERD, colon polyps, hiatal hernia, RA, CVA, CKD IIIB, aortic stenosis s/p TAVR, recurrent falls, subdural hematoma, RUE DVT on Eliquis , hepatic abscess, anemia, esophageal varices.    PT Comments  Pt received in supine, agreeable to therapy session and eager to work on OOB to [facility] and chair transfers. Pt A&O x1-2, calling out for spouse "[name]" initially but then able to state she is probably gone home for the day after discussion. Pt needing dense multimodal cues for safety with transfers and AD use and up to +2 modA for short distance gait tasks, with increased instability with backward stepping with RW support. Patient will benefit from continued inpatient follow up therapy, <3 hours/day.    If plan is discharge home, recommend the following: A lot of help with walking and/or transfers;A lot of help with bathing/dressing/bathroom   Can travel by private vehicle     Yes  Equipment Recommendations  Rolling walker (2 wheels);Wheelchair (measurements PT);Wheelchair cushion (measurements PT)    Recommendations for Other Services       Precautions / Restrictions Precautions Precautions: Fall Recall of Precautions/Restrictions: Impaired Precaution/Restrictions Comments: waxing/waning cognition Restrictions Weight Bearing Restrictions Per Provider Order: No     Mobility  Bed Mobility Overal bed mobility: Needs Assistance Bed Mobility: Supine to Sit     Supine to sit: Mod assist, HOB elevated, Used rails     General bed mobility comments: cues for lateral leans to advance hips, needs bed pad assist, increased  assist today to elevate trunk    Transfers Overall transfer level: Needs assistance Equipment used: Rolling walker (2 wheels) Transfers: Sit to/from Stand, Bed to chair/wheelchair/BSC Sit to Stand: Mod assist, From elevated surface, +2 safety/equipment   Step pivot transfers: Mod assist, +2 physical assistance, From elevated surface       General transfer comment: Retropulsive with initial stance, needing gait belt and +2 lift assist after bed height elevated more to achieve upright. Pivotal steps to [facility], then pt defers needing BM, so pivotal steps to recliner chair.    Ambulation/Gait Ambulation/Gait assistance: Mod assist, +2 physical assistance Gait Distance (Feet): 5 Feet Assistive device: Rolling walker (2 wheels) Gait Pattern/deviations: Decreased stride length, Step-to pattern, Shuffle, Trunk flexed, Leaning posteriorly       General Gait Details: pivotal and backward steps at bedside as pt c/o fatigue quickly and had to sit back in chair. Pt impulsive to sit without reaching back and needs multiple steadying assist and cues to step backward more steps to feel chair behind him prior to sitting; pt needs hand over hand assist to reach for arm rest.   Stairs             Wheelchair Mobility     Tilt Bed    Modified Rankin (Stroke Patients Only)       Balance Overall balance assessment: Needs assistance Sitting-balance support: Feet supported, No upper extremity supported Sitting balance-Leahy Scale: Fair     Standing balance support: During functional activity, Reliant on assistive device for balance, Bilateral upper extremity supported Standing balance-Leahy Scale: Poor Standing balance comment: reliant on RW support and +2 external assist for safety  Communication Communication Communication: No apparent difficulties  Cognition Arousal: Alert Behavior During Therapy: Flat affect, Impulsive   PT - Cognitive  impairments: Orientation, Awareness, Memory, Attention, Safety/Judgement, Problem solving, Initiation, Sequencing   Orientation impairments: Place, Time, Situation                   PT - Cognition Comments: Pt calling for spouse loudly prior to PTA arrival, when PTA came to room he was able to state she was leaving around "4:45 or 5pm" and pt seems more oriented to time of day and location after that. Pt asking about dinner but also reports he isn't sure if he ordered it, NT notified and agreeable to call to check for him. Following commands: Impaired Following commands impaired: Only follows one step commands consistently, Follows one step commands with increased time, Follows multi-step commands inconsistently    Cueing Cueing Techniques: Verbal cues, Gestural cues, Tactile cues  Exercises      General Comments General comments (skin integrity, edema, etc.): chair alarm placed for safety and RN encouraged to use Stedy to get him back to bed after he is done with sitting up for dinner, as pt needed +2 assist to pivot with RW; board updated that he needs +2 assist as well; no BM on BSC      Pertinent Vitals/Pain Pain Assessment Pain Assessment: No/denies pain    Home Living                          Prior Function            PT Goals (current goals can now be found in the care plan section) Acute Rehab PT Goals Patient Stated Goal: return to [facility] PT Goal Formulation: With patient Time For Goal Achievement: 03/18/24 Progress towards PT goals: Progressing toward goals    Frequency    Min 2X/week      PT Plan      Co-evaluation              AM-PAC PT "6 Clicks" Mobility   Outcome Measure  Help needed turning from your back to your side while in a flat bed without using bedrails?: A Little Help needed moving from lying on your back to sitting on the side of a flat bed without using bedrails?: A Lot Help needed moving to and from a bed to a  chair (including a wheelchair)?: A Lot Help needed standing up from a chair using your arms (e.g., wheelchair or bedside chair)?: A Lot Help needed to walk in hospital room?: Total Help needed climbing 3-5 steps with a railing? : Total 6 Click Score: 11    End of Session Equipment Utilized During Treatment: Gait belt Activity Tolerance: Patient tolerated treatment well Patient left: in chair;with call bell/phone within reach;with chair alarm set Nurse Communication: Mobility status PT Visit Diagnosis: Unsteadiness on feet (R26.81);Muscle weakness (generalized) (M62.81);Difficulty in walking, not elsewhere classified (R26.2);History of falling (Z91.81)     Time: 1610-9604 PT Time Calculation (min) (ACUTE ONLY): 17 min  Charges:    $Therapeutic Activity: 8-22 mins PT General Charges $$ ACUTE PT VISIT: 1 Visit                     Renton Berkley P., PTA Acute Rehabilitation Services Secure Chat Preferred 9a-5:30pm Office: [phone]    Mariel Shope [facility] 03/05/2024, 6:13 PM

## 2024-03-06 DIAGNOSIS — I5031 Acute diastolic (congestive) heart failure: Secondary | ICD-10-CM | POA: Diagnosis not present

## 2024-03-06 DIAGNOSIS — I8500 Esophageal varices without bleeding: Secondary | ICD-10-CM | POA: Diagnosis not present

## 2024-03-06 DIAGNOSIS — Z992 Dependence on renal dialysis: Secondary | ICD-10-CM

## 2024-03-06 DIAGNOSIS — K5521 Angiodysplasia of colon with hemorrhage: Secondary | ICD-10-CM | POA: Diagnosis not present

## 2024-03-06 DIAGNOSIS — N1832 Chronic kidney disease, stage 3b: Secondary | ICD-10-CM

## 2024-03-06 DIAGNOSIS — K5520 Angiodysplasia of colon without hemorrhage: Secondary | ICD-10-CM | POA: Diagnosis not present

## 2024-03-06 DIAGNOSIS — K921 Melena: Secondary | ICD-10-CM

## 2024-03-06 DIAGNOSIS — D649 Anemia, unspecified: Secondary | ICD-10-CM | POA: Diagnosis not present

## 2024-03-06 LAB — CBC
HCT: 23.9 % — ABNORMAL LOW (ref 39.0–52.0)
Hemoglobin: 7.8 g/dL — ABNORMAL LOW (ref 13.0–17.0)
MCH: 26.8 pg (ref 26.0–34.0)
MCHC: 32.6 g/dL (ref 30.0–36.0)
MCV: 82.1 fL (ref 80.0–100.0)
Platelets: 262 10*3/uL (ref 150–400)
RBC: 2.91 MIL/uL — ABNORMAL LOW (ref 4.22–5.81)
RDW: 18.6 % — ABNORMAL HIGH (ref 11.5–15.5)
WBC: 10.3 10*3/uL (ref 4.0–10.5)
nRBC: 0 % (ref 0.0–0.2)

## 2024-03-06 LAB — MAGNESIUM: Magnesium: 1.7 mg/dL (ref 1.7–2.4)

## 2024-03-06 LAB — TYPE AND SCREEN
ABO/RH(D): O POS
Antibody Screen: NEGATIVE
Unit division: 0
Unit division: 0

## 2024-03-06 LAB — BPAM RBC
Blood Product Expiration Date: 202506102359
Blood Product Expiration Date: 202506102359
ISSUE DATE / TIME: 202505161621
Unit Type and Rh: 5100
Unit Type and Rh: 5100

## 2024-03-06 LAB — BASIC METABOLIC PANEL WITH GFR
Anion gap: 8 (ref 5–15)
BUN: 35 mg/dL — ABNORMAL HIGH (ref 8–23)
CO2: 22 mmol/L (ref 22–32)
Calcium: 8.5 mg/dL — ABNORMAL LOW (ref 8.9–10.3)
Chloride: 107 mmol/L (ref 98–111)
Creatinine, Ser: 2.21 mg/dL — ABNORMAL HIGH (ref 0.61–1.24)
GFR, Estimated: 29 mL/min — ABNORMAL LOW (ref >60)
Glucose, Bld: 129 mg/dL — ABNORMAL HIGH (ref 70–99)
Potassium: 3.9 mmol/L (ref 3.5–5.1)
Sodium: 137 mmol/L (ref 135–145)

## 2024-03-06 LAB — HEMOGLOBIN AND HEMATOCRIT, BLOOD
HCT: 23.3 % — ABNORMAL LOW (ref 39.0–52.0)
Hemoglobin: 7.7 g/dL — ABNORMAL LOW (ref 13.0–17.0)

## 2024-03-06 LAB — GLUCOSE, CAPILLARY
Glucose-Capillary: 131 mg/dL — ABNORMAL HIGH (ref 70–99)
Glucose-Capillary: 117 mg/dL — ABNORMAL HIGH (ref 70–99)
Glucose-Capillary: 217 mg/dL — ABNORMAL HIGH (ref 70–99)
Glucose-Capillary: 167 mg/dL — ABNORMAL HIGH (ref 70–99)
Glucose-Capillary: 241 mg/dL — ABNORMAL HIGH (ref 70–99)

## 2024-03-06 MED ORDER — PANTOPRAZOLE SODIUM 40 MG PO TBEC
40.0000 mg | DELAYED_RELEASE_TABLET | Freq: Every day | ORAL | Status: DC
  Administered 2024-03-06 – 2024-03-09 (×4): 40 mg via ORAL
  Filled 2024-03-06 (×5): qty 1

## 2024-03-06 NOTE — Plan of Care (Signed)
  Problem: Education: Goal: Ability to describe self-care measures that may prevent or decrease complications (Diabetes Survival Skills Education) will improve 03/06/2024 0542 by Darl Edu, RN Outcome: Progressing 03/06/2024 0542 by Darl Edu, RN Outcome: Progressing Goal: Individualized Educational Video(s) 03/06/2024 0542 by Darl Edu, RN Outcome: Progressing 03/06/2024 0542 by Darl Edu, RN Outcome: Progressing   Problem: Coping: Goal: Ability to adjust to condition or change in health will improve 03/06/2024 0542 by Darl Edu, RN Outcome: Progressing 03/06/2024 0542 by Darl Edu, RN Outcome: Progressing   Problem: Fluid Volume: Goal: Ability to maintain a balanced intake and output will improve Outcome: Progressing   Problem: Health Behavior/Discharge Planning: Goal: Ability to identify and utilize available resources and services will improve Outcome: Progressing Goal: Ability to manage health-related needs will improve Outcome: Progressing   Problem: Metabolic: Goal: Ability to maintain appropriate glucose levels will improve Outcome: Progressing   Problem: Nutritional: Goal: Maintenance of adequate nutrition will improve Outcome: Progressing Goal: Progress toward achieving an optimal weight will improve Outcome: Progressing   Problem: Skin Integrity: Goal: Risk for impaired skin integrity will decrease Outcome: Progressing   Problem: Tissue Perfusion: Goal: Adequacy of tissue perfusion will improve Outcome: Progressing   Problem: Education: Goal: Knowledge of General Education information will improve Description: Including pain rating scale, medication(s)/side effects and non-pharmacologic comfort measures Outcome: Progressing   Problem: Health Behavior/Discharge Planning: Goal: Ability to manage health-related needs will improve Outcome: Progressing   Problem: Clinical Measurements: Goal: Ability  to maintain clinical measurements within normal limits will improve Outcome: Progressing Goal: Will remain free from infection Outcome: Progressing Goal: Diagnostic test results will improve Outcome: Progressing Goal: Respiratory complications will improve Outcome: Progressing Goal: Cardiovascular complication will be avoided Outcome: Progressing   Problem: Activity: Goal: Risk for activity intolerance will decrease Outcome: Progressing   Problem: Nutrition: Goal: Adequate nutrition will be maintained Outcome: Progressing   Problem: Coping: Goal: Level of anxiety will decrease Outcome: Progressing   Problem: Elimination: Goal: Will not experience complications related to bowel motility Outcome: Progressing Goal: Will not experience complications related to urinary retention Outcome: Progressing   Problem: Pain Managment: Goal: General experience of comfort will improve and/or be controlled Outcome: Progressing   Problem: Safety: Goal: Ability to remain free from injury will improve Outcome: Progressing   Problem: Skin Integrity: Goal: Risk for impaired skin integrity will decrease Outcome: Progressing

## 2024-03-06 NOTE — Progress Notes (Signed)
 Progress Note   Patient: Tommy Green NWG:[id number] DOB: 1945/11/30 DOA: 03/02/2024     3 DOS: the patient was seen and examined on 03/06/2024   Brief hospital course:  78 y.o. male with history h/o HTN, HLP, GERD, colon polyps, hiatal hernia, rheumatoid arthritis, CVA, CKD stage IIIb, aortic stenosis s/p TAVR, recurrent falls with history of subdural hematoma in the past, DVT of right upper extremity on Eliquis , hepatic abscess, chronic anemia, grade I esophageal varices, p/w bilateral leg swelling, dark stools, and worsening weakness.   Assessment and Plan:   Acute on chronic blood loss anemia - Hemoglobin 6.4 on presentation (8.1 last month).  Status post 1 unit PRBCs.  Hemoglobin appears stable now.  Will recheck hemoglobin in AM.  Transfuse if less than 7.   Concern for upper GI bleed -Noted history of varices and reported dark stools.  Guaiac positive on presentation.  Anemia on presentation.  Status post Kcentra  x 1 in the ED.  Anticoagulation on hold.  GI consulted.  S/P small bowel enteroscopy 5/19 with noted grade 1 varices and small nonbleeding angioectasia.  No active bleeding appreciated.  GI stating patient can restart Eliquis .  Patient's wife requesting to discontinue it given patient's multiple bleeding history.  Will concur with patient's wife, will discontinue Eliquis .  GI signing off for the time being.  If rebleeding occurs, may warrant repeat endoscopy.   Acute exacerbation of HFpEF - Noted 3+ BL LE pitting edema on presentation.  Echo last month showed preserved EF.  No previous history of lower extremity edema.  Responding well to IV Lasix  40 mg twice daily.  Monitor I's and O's, 1500 mL fluid restriction.  Net negative nearly 8 L since presentation.  Review of the edema could be related to hypoalbuminemia and liver dysfunction.  Edema improving but not totally resolved.  Continue diuresis.   Acute on chronic diarrhea with non-anion gap metabolic acidosis - CO2 16 on  presentation.  Likely GI loss given multiple bowel movements daily.  Due to patient's antibiotic use, C. difficile ordered and subsequently negative.  Responding well to as needed loperamide .  Received sodium bicarb x 1 amp.  Recheck BMP and mag in AM.  Acidemia appears resolved.     AKI on CKD-3b -Cr above baseline.  Likely cardiorenal etiology.  Diuretics on board.  Will monitor urine output and recheck BMP and mag in am.   Hypomagnesemia - Patient was experiencing cramps.  Magnesium  checked and subsequently found to be low.  Likely exacerbated by diuresis.  Replenishment initiated.  Cramping resolved.  Will recheck magnesium  in AM.   Hepatic abscess (POA) - Noted on previous hospitalization, is on p.o. Cipro  plus Flagyl  (end date 5/22).  Previously scheduled to follow-up with infectious disease on Monday 5/19.  Continue p.o. antibiotics.   History of DVT - Anticoagulation being held secondary to GI bleed above.  Recent history of DVT.  Discussion with patient's wife to discontinue Eliquis .  Doppler ultrasound showed no DVT at this time.   History of AS s/p TAVR -noted.   Hypoglycemia - Appears resolved.   Subjective: Patient sitting up in the bedside chair, visiting with his wife in the room as well.  No active bleeding.  Patient alert, talkative, no acute distress.  Denies any shortness of breath, chest pain, nausea, vomiting, abdominal pain.  Lower extremity edema showing marked improvement since presentation.  Urine output continues to be excellent.  Updated patient's wife on his upper endoscopy yesterday as well as plan  going forward.  Physical Exam: Vitals:   03/05/24 1813 03/05/24 1950 03/06/24 0525 03/06/24 0724  BP: (!) 133/58 110/61 134/76 132/69  Pulse: 65 76 64 65  Resp:  18 18   Temp:  98 F (36.7 C) 98 F (36.7 C) 98 F (36.7 C)  TempSrc:  Oral Oral Oral  SpO2: 99% 99% 98% 99%  Weight:      Height:        GENERAL:  Alert, pleasant, no acute distress  HEENT:   EOMI CARDIOVASCULAR:  RRR, no murmurs appreciated RESPIRATORY:  Clear to auscultation, no wheezing, rales, or rhonchi GASTROINTESTINAL:  Soft, nontender, nondistended EXTREMITIES: 1-2+ BL LE pitting edema NEURO:  No new focal deficits appreciated SKIN:  No rashes noted PSYCH:  Appropriate mood and affect    Data Reviewed:  No new imaging to review  Previous records (including but not limited to H&P, progress notes, nursing notes, TOC management) were reviewed in assessment of this patient.  Labs: CBC: Recent Labs  Lab 03/02/24 1354 03/02/24 2208 03/03/24 0528 03/05/24 0409 03/05/24 1025 03/05/24 1838 03/06/24 0745 03/06/24 1047  WBC 8.2 8.2  --  12.7*  --   --  10.3  --   NEUTROABS 5.6  --   --   --   --   --   --   --   HGB 6.4* 7.1*   < > 8.1* 7.8* 7.9* 7.8* 7.7*  HCT 20.5* 21.6*   < > 24.8* 24.6* 24.5* 23.9* 23.3*  MCV 85.8 82.4  --  82.7  --   --  82.1  --   PLT 262 240  --  256  --   --  262  --    < > = values in this interval not displayed.   Basic Metabolic Panel: Recent Labs  Lab 03/02/24 1354 03/03/24 0528 03/03/24 1429 03/05/24 0409 03/06/24 0745  NA 139 141  --  137 137  K 4.6 4.2  --  4.5 3.9  CL 112* 115*  --  109 107  CO2 16* 17*  --  21* 22  GLUCOSE 169* 64*  --  105* 129*  BUN 47* 45*  --  38* 35*  CREATININE 2.32* 2.14*  --  2.16* 2.21*  CALCIUM 8.0* 8.2*  --  8.8* 8.5*  MG  --   --  1.5* 1.7 1.7   Liver Function Tests: Recent Labs  Lab 03/02/24 1354 03/05/24 0409  AST 24 215*  ALT 15 53*  ALKPHOS 51 135*  BILITOT 0.4 1.7*  PROT 5.3* 5.6*  ALBUMIN  2.4* 2.4*   CBG: Recent Labs  Lab 03/05/24 1711 03/05/24 2236 03/06/24 0538 03/06/24 0726 03/06/24 1149  GLUCAP 195* 220* 131* 117* 217*    Scheduled Meds:  aspirin  EC  81 mg Oral Daily   B-complex with vitamin C  1 tablet Oral Daily   Chlorhexidine  Gluconate Cloth  6 each Topical Daily   cholecalciferol   2,000 Units Oral Daily   ciprofloxacin   500 mg Oral BID    cyanocobalamin   1,000 mcg Oral Daily   dextrose   1 ampule Intravenous Once   escitalopram   20 mg Oral Daily   ezetimibe   10 mg Oral Daily   fenofibrate   160 mg Oral Daily   finasteride   5 mg Oral Daily   furosemide   40 mg Intravenous BID   insulin  aspart  0-5 Units Subcutaneous QHS   insulin  aspart  0-9 Units Subcutaneous TID WC   leflunomide   10 mg  Oral Daily   leptospermum manuka honey  1 Application Topical Daily   liver oil-zinc  oxide   Topical BID   magnesium  oxide  400 mg Oral Q lunch   melatonin  5 mg Oral QHS   metoprolol  tartrate  12.5 mg Oral BID   metroNIDAZOLE   500 mg Oral Q8H   mupirocin  ointment  1 Application Nasal BID   nystatin    Topical BID   omega-3 acid ethyl esters  2,000 mg Oral Q lunch   pantoprazole   40 mg Oral Daily   traZODone   25 mg Oral QPM   vitamin E   400 Units Oral Daily   Continuous Infusions: PRN Meds:.acetaminophen , albuterol , dextrose , loperamide , ondansetron  **OR** ondansetron  (ZOFRAN ) IV  Family Communication: Wife at bedside  Disposition: Status is: Inpatient Remains inpatient appropriate because: HFrEF, GI bleed     Time spent: 49 minutes  Length of stay: 3 days  Author: Jodeane Mulligan, DO 03/06/2024 12:40 PM  For on call review [email or web address].

## 2024-03-06 NOTE — Progress Notes (Signed)
 Tommy Green GASTROENTEROLOGY ROUNDING NOTE   Subjective: Patient feeling well this morning.  Denies any bloody bowel movements.  Wondering when he can go home.  Hgb stable at 7.8   Objective: Vital signs in last 24 hours: Temp:  [98 F (36.7 C)-98.8 F (37.1 C)] 98 F (36.7 C) (05/20 0724) Pulse Rate:  [62-76] 65 (05/20 0724) Resp:  [18-19] 18 (05/20 0525) BP: (110-142)/(58-76) 132/69 (05/20 0724) SpO2:  [97 %-99 %] 99 % (05/20 0724) Last BM Date : 03/05/24 General: NAD, pleasant elderly male, A&Ox3 Lungs:  CTA b/l, no w/r/r Heart:  RRR Abdomen:  Soft, NT, ND, +BS Ext: B/l 1-2+ edema    Intake/Output from previous day: 05/19 0701 - 05/20 0700 In: 700 [P.O.:300; I.V.:400] Out: 3250 [Urine:3250] Intake/Output this shift: Total I/O In: -  Out: 700 [Urine:700]   Lab Results: Recent Labs    03/05/24 0409 03/05/24 1025 03/05/24 1838 03/06/24 0745  WBC 12.7*  --   --  10.3  HGB 8.1* 7.8* 7.9* 7.8*  PLT 256  --   --  262  MCV 82.7  --   --  82.1   BMET Recent Labs    03/05/24 0409 03/06/24 0745  NA 137 137  K 4.5 3.9  CL 109 107  CO2 21* 22  GLUCOSE 105* 129*  BUN 38* 35*  CREATININE 2.16* 2.21*  CALCIUM 8.8* 8.5*   LFT Recent Labs    03/05/24 0409  PROT 5.6*  ALBUMIN  2.4*  AST 215*  ALT 53*  ALKPHOS 135*  BILITOT 1.7*   PT/INR Recent Labs    03/05/24 0409  INR 1.2      Imaging/Other results: VAS US  LOWER EXTREMITY VENOUS (DVT) Result Date: 03/05/2024  Lower Venous DVT Study Patient Name:  Tommy Green  Date of Exam:   03/05/2024 Medical Rec #: [id number]          Accession #:    [id number] Date of Birth: October 16, 1946           Patient Gender: M Patient Age:   22 years Exam Location:  [facility] Procedure:      VAS US  LOWER EXTREMITY VENOUS (DVT) Referring Phys: Michela Aguas --------------------------------------------------------------------------------  Indications: Edema.  Anticoagulation: Eliquis  (prior to admission).  Limitations: Body habitus and poor ultrasound/tissue interface. Comparison Study: No previous exams Performing Technologist: Jody Hill RVT, RDMS  Examination Guidelines: A complete evaluation includes B-mode imaging, spectral Doppler, color Doppler, and power Doppler as needed of all accessible portions of each vessel. Bilateral testing is considered an integral part of a complete examination. Limited examinations for reoccurring indications may be performed as noted. The reflux portion of the exam is performed with the patient in reverse Trendelenburg.  +---------+---------------+---------+-----------+----------+--------------+ RIGHT    CompressibilityPhasicitySpontaneityPropertiesThrombus Aging +---------+---------------+---------+-----------+----------+--------------+ CFV      Full           Yes      Yes                                 +---------+---------------+---------+-----------+----------+--------------+ SFJ      Full                                                        +---------+---------------+---------+-----------+----------+--------------+ FV Prox  Full  Yes      Yes                                 +---------+---------------+---------+-----------+----------+--------------+ FV Mid   Full           Yes      Yes                                 +---------+---------------+---------+-----------+----------+--------------+ FV DistalFull           Yes      Yes                                 +---------+---------------+---------+-----------+----------+--------------+ PFV      Full                                                        +---------+---------------+---------+-----------+----------+--------------+ POP      Full           Yes      Yes                                 +---------+---------------+---------+-----------+----------+--------------+ PTV      Full                                                         +---------+---------------+---------+-----------+----------+--------------+ PERO     Full                                                        +---------+---------------+---------+-----------+----------+--------------+   +---------+---------------+---------+-----------+----------+--------------+ LEFT     CompressibilityPhasicitySpontaneityPropertiesThrombus Aging +---------+---------------+---------+-----------+----------+--------------+ CFV      Full           Yes      Yes                                 +---------+---------------+---------+-----------+----------+--------------+ SFJ      Full                                                        +---------+---------------+---------+-----------+----------+--------------+ FV Prox  Full           Yes      Yes                                 +---------+---------------+---------+-----------+----------+--------------+ FV Mid   Full           Yes      Yes                                 +---------+---------------+---------+-----------+----------+--------------+  FV DistalFull           Yes      Yes                                 +---------+---------------+---------+-----------+----------+--------------+ PFV      Full                                                        +---------+---------------+---------+-----------+----------+--------------+ POP      Full           Yes      Yes                                 +---------+---------------+---------+-----------+----------+--------------+ PTV      Full                                                        +---------+---------------+---------+-----------+----------+--------------+ PERO     Full                                                        +---------+---------------+---------+-----------+----------+--------------+     Summary: BILATERAL: - No evidence of deep vein thrombosis seen in the lower extremities, bilaterally. -No evidence of  popliteal cyst, bilaterally. -Subcutaneous edema, bilaterally.  *See table(s) above for measurements and observations. Electronically signed by Runell Countryman on 03/05/2024 at 3:45:14 PM.    Final    VAS US  UPPER EXTREMITY VENOUS DUPLEX Result Date: 03/05/2024 UPPER VENOUS STUDY  Patient Name:  Tommy Green  Date of Exam:   03/05/2024 Medical Rec #: [id number]          Accession #:    [id number] Date of Birth: 1946-05-14           Patient Gender: M Patient Age:   78 years Exam Location:  [facility] - [facility] Procedure:      VAS US  UPPER EXTREMITY VENOUS DUPLEX Referring Phys: Michela Aguas --------------------------------------------------------------------------------  Indications: Follow up exam Anticoagulation: Eliquis  (prior to admission). Comparison Study: Previous exam on 01/19/2024 SVT of basilic and cephalic veins,                   DVT of radial vein Performing Technologist: Jody Hill RVT, RDMS  Examination Guidelines: A complete evaluation includes B-mode imaging, spectral Doppler, color Doppler, and power Doppler as needed of all accessible portions of each vessel. Bilateral testing is considered an integral part of a complete examination. Limited examinations for reoccurring indications may be performed as noted.  Right Findings: +----------+------------+---------+-----------+----------+-------+ RIGHT     CompressiblePhasicitySpontaneousPropertiesSummary +----------+------------+---------+-----------+----------+-------+ IJV           Full       Yes       Yes                      +----------+------------+---------+-----------+----------+-------+ Subclavian  Full       Yes       Yes                      +----------+------------+---------+-----------+----------+-------+ Axillary      Full       Yes       Yes                      +----------+------------+---------+-----------+----------+-------+ Brachial      Full       Yes       Yes                       +----------+------------+---------+-----------+----------+-------+ Radial        Full                                          +----------+------------+---------+-----------+----------+-------+ Ulnar         Full                                          +----------+------------+---------+-----------+----------+-------+ Cephalic      Full                                          +----------+------------+---------+-----------+----------+-------+ Basilic       Full       Yes       Yes                      +----------+------------+---------+-----------+----------+-------+  Left Findings: +----------+------------+---------+-----------+----------+-------+ LEFT      CompressiblePhasicitySpontaneousPropertiesSummary +----------+------------+---------+-----------+----------+-------+ Subclavian    Full       Yes       Yes                      +----------+------------+---------+-----------+----------+-------+  Summary:  Right: No evidence of deep vein thrombosis in the upper extremity. No evidence of superficial vein thrombosis in the upper extremity.  Left: No evidence of thrombosis in the subclavian.  *See table(s) above for measurements and observations.  Diagnosing physician: Runell Countryman Electronically signed by Runell Countryman on 03/05/2024 at 3:44:58 PM.    Final       Assessment and Plan:  78 y.o. year old male with multiple medical problems to include  GERD / hiatal hernia, colon polyps,  rheumatoid arthritis, CVA, CKD stage IIIb, aortic stenosis s/p TAVR on Eliquis , CHF, DVT.  Admitted for anemia and reported black stools at living facility. See 5/16 GI consult.   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  Once again proximal esophageal varices were noted, but no bleeding stigmata.  No other bleeding sources and no evidence of recent bleeding.  Patient's hemoglobin has been stable since admission and he has not had overt  bleeding.  Melena, suspected GI bleed/acute on chronic anemia - Suspect bleeding secondary to intestinal AVMs, one lesion in jejunum treated, but possible there are others - Bleeding appears to have resolved currently based on stable hgb and no overt GI bleeding - Ok to resume Eliquis  - Would consider colonoscopy or VCE if patient were to  show evidence of recurrent bleeding  Proximal esophageal varices Noted initially in 2021, unclear etiology, no evidence of subclavian thrombosis on CT - Appear stable from 2021, no bleeding stigmata - No additional therapy recommended  Hepatic abscess - Continue antibiotics, f/u ID  Ok to discharge from GI bleeding standpoint, although patient may require further diuresis, defer to hospitalist GI will sign off at this time    Elois Hair, MD  03/06/2024, 10:59 AM [facility]

## 2024-03-07 ENCOUNTER — Inpatient Hospital Stay (HOSPITAL_COMMUNITY)

## 2024-03-07 DIAGNOSIS — K921 Melena: Secondary | ICD-10-CM | POA: Diagnosis not present

## 2024-03-07 DIAGNOSIS — M19012 Primary osteoarthritis, left shoulder: Secondary | ICD-10-CM | POA: Diagnosis not present

## 2024-03-07 DIAGNOSIS — M25512 Pain in left shoulder: Secondary | ICD-10-CM | POA: Diagnosis not present

## 2024-03-07 LAB — COMPREHENSIVE METABOLIC PANEL WITH GFR
ALT: 43 U/L (ref 0–44)
AST: 59 U/L — ABNORMAL HIGH (ref 15–41)
Albumin: 2.4 g/dL — ABNORMAL LOW (ref 3.5–5.0)
Alkaline Phosphatase: 117 U/L (ref 38–126)
Anion gap: 8 (ref 5–15)
BUN: 39 mg/dL — ABNORMAL HIGH (ref 8–23)
CO2: 22 mmol/L (ref 22–32)
Calcium: 8.2 mg/dL — ABNORMAL LOW (ref 8.9–10.3)
Chloride: 105 mmol/L (ref 98–111)
Creatinine, Ser: 2.27 mg/dL — ABNORMAL HIGH (ref 0.61–1.24)
GFR, Estimated: 28 mL/min — ABNORMAL LOW (ref >60)
Glucose, Bld: 140 mg/dL — ABNORMAL HIGH (ref 70–99)
Potassium: 3.6 mmol/L (ref 3.5–5.1)
Sodium: 135 mmol/L (ref 135–145)
Total Bilirubin: 0.7 mg/dL (ref 0.0–1.2)
Total Protein: 5.5 g/dL — ABNORMAL LOW (ref 6.5–8.1)

## 2024-03-07 LAB — GLUCOSE, CAPILLARY
Glucose-Capillary: 141 mg/dL — ABNORMAL HIGH (ref 70–99)
Glucose-Capillary: 172 mg/dL — ABNORMAL HIGH (ref 70–99)
Glucose-Capillary: 175 mg/dL — ABNORMAL HIGH (ref 70–99)

## 2024-03-07 LAB — MAGNESIUM: Magnesium: 1.5 mg/dL — ABNORMAL LOW (ref 1.7–2.4)

## 2024-03-07 MED ORDER — FUROSEMIDE 20 MG PO TABS: 40.0000 mg | ORAL_TABLET | Freq: Every day | ORAL | Status: AC

## 2024-03-07 MED ORDER — CIPROFLOXACIN HCL 500 MG PO TABS: 500.0000 mg | ORAL_TABLET | Freq: Two times a day (BID) | ORAL | Status: AC

## 2024-03-07 MED ORDER — METRONIDAZOLE 500 MG PO TABS: 500.0000 mg | ORAL_TABLET | Freq: Two times a day (BID) | ORAL | Status: AC

## 2024-03-07 NOTE — Progress Notes (Signed)
 Physical Therapy Treatment Patient Details Name: Tommy Green MRN: [id number] DOB: [birth year] Today's Date: 03/07/2024   History of Present Illness Pt is an 78 y/o M presenting to ED on 5/16 with BLE edema and weakness, dark stools, admitted for GI bleed. S/P small bowel enteroscopy 5/19 AM showing "Noted grade 1 varices and small nonbleeding angioectasia". Of note, pt c/o L shoulder pain and with observed poor shoulder flexion ROM/strength, per spouse this is probably new since fall/admission, MD notified. PMH includes HTN, GERD, colon polyps, hiatal hernia, RA, CVA, CKD IIIB, aortic stenosis s/p TAVR, recurrent falls, subdural hematoma, RUE DVT on Eliquis , hepatic abscess, anemia, esophageal varices.    PT Comments  Pt received in supine, pleasantly agreeable to therapy session and with improved tolerance for transfer and gait training this session. Pt needing up to +2 modA to perform bed mobiltiy and transfers and very close chair follow for safety during gait trials x2 in hallway, pt sitting very abruptly with no warning as he fatigues. Pt with improved standing tolerance this date and able to initiate some seated and standing BLE exercises. Pt reporting some LUE weakness and pain, RN/MD notified as spouse reports this may be new for him and he fell on L shoulder prior to admission. Patient will benefit from continued inpatient follow up therapy, <3 hours/day    If plan is discharge home, recommend the following: A lot of help with walking and/or transfers;A lot of help with bathing/dressing/bathroom   Can travel by private vehicle     Yes  Equipment Recommendations  Rolling walker (2 wheels);Wheelchair (measurements PT);Wheelchair cushion (measurements PT)    Recommendations for Other Services       Precautions / Restrictions Precautions Precautions: Fall Recall of Precautions/Restrictions: Impaired Precaution/Restrictions Comments: waxing/waning cognition Restrictions Weight  Bearing Restrictions Per Provider Order: No     Mobility  Bed Mobility Overal bed mobility: Needs Assistance Bed Mobility: Supine to Sit     Supine to sit: Mod assist, +2 for safety/equipment, +2 for physical assistance     General bed mobility comments: bed pad used to assist with scooting forward assistance with trunk due to posterior leaning, patient with complaints of LUE shoulder pain with bed mobility    Transfers Overall transfer level: Needs assistance Equipment used: Rolling walker (2 wheels) Transfers: Sit to/from Stand, Bed to chair/wheelchair/BSC Sit to Stand: Mod assist, Min assist, +2 physical assistance           General transfer comment: cues for hand placement and scooting forward in chair, from EOB>RW +2 minA, then from chair height, pt needing mod to max assist of one for sit to stand or min assist +2 to lift up. Posterior lean upon standing.    Ambulation/Gait Ambulation/Gait assistance: Mod assist, +2 safety/equipment Gait Distance (Feet): 50 Feet (47ft, seated break, 29ft) Assistive device: Rolling walker (2 wheels) Gait Pattern/deviations: Step-through pattern, Decreased stride length, Decreased dorsiflexion - right, Decreased dorsiflexion - left, Leaning posteriorly, Narrow base of support       General Gait Details: improved posture and RW management this date, pt needing min to modA for stability and very close chair follow as pt tending to sit abruptly without warning, despite discussion on him notifying staff before he sits. Pt needing increased assist, +2 modA for backward stepping initially.   Stairs             Wheelchair Mobility     Tilt Bed    Modified Rankin (Stroke Patients Only)  Balance Overall balance assessment: Needs assistance Sitting-balance support: Feet supported, No upper extremity supported Sitting balance-Leahy Scale: Poor Sitting balance - Comments: poor dynamic seated balance, fair static sitting    Standing balance support: During functional activity, Reliant on assistive device for balance, Bilateral upper extremity supported, Single extremity supported Standing balance-Leahy Scale: Poor Standing balance comment: reliant on RW or sink for support when standing, able to stand with one extremity support with grooming tasks but required min assist for balance                            Communication Communication Communication: No apparent difficulties  Cognition Arousal: Alert Behavior During Therapy: Flat affect   PT - Cognitive impairments: Memory, Sequencing, Problem solving, Safety/Judgement                       PT - Cognition Comments: Pt more aware of situation this date, spouse present and encouraging him. pt c/o L shoulder pain but not able to reliably report how long this has been bothering him, per spouse she thinks it is a new complaint, so MD/RN notified. Following commands: Impaired Following commands impaired: Only follows one step commands consistently, Follows one step commands with increased time, Follows multi-step commands inconsistently    Cueing Cueing Techniques: Verbal cues, Gestural cues, Tactile cues  Exercises Other Exercises Other Exercises: LUE AA/PROM shoulder flexion a few reps w/pt needing to self-ROM at times due to LUE weakness at shoulder. elbow flex/ext wtihout increased c/o pain/difficulty on L and R sides, MD notified of pt L shoulder concerns Other Exercises: pt using BLE to "walk" rolling chair back to room for hamstring strengthening and to "push" backward once in room for quad strengthening ~5 mins total Other Exercises: standing mini squats x 5 reps prior to gait trial to work on posture/stability as pt initially with heavy posterior lean    General Comments General comments (skin integrity, edema, etc.): SpO2/HR WFL on RA per pulse oximeter; Nursing educated on sit to stands and transfers with patient, suggest Stedy if  increased difficulty with transfer back to bed      Pertinent Vitals/Pain Pain Assessment Pain Assessment: Faces Faces Pain Scale: Hurts a little bit Pain Location: LUE shoulder Pain Descriptors / Indicators: Grimacing, Guarding, Sore Pain Intervention(s): Limited activity within patient's tolerance, Monitored during session, Repositioned    Home Living                          Prior Function            PT Goals (current goals can now be found in the care plan section) Acute Rehab PT Goals Patient Stated Goal: return to [facility] PT Goal Formulation: With patient Time For Goal Achievement: 03/18/24 Progress towards PT goals: Progressing toward goals    Frequency    Min 2X/week      PT Plan      Co-evaluation PT/OT/SLP Co-Evaluation/Treatment: Yes Reason for Co-Treatment: For patient/therapist safety;To address functional/ADL transfers;Necessary to address cognition/behavior during functional activity PT goals addressed during session: Mobility/safety with mobility;Balance;Proper use of DME;Strengthening/ROM OT goals addressed during session: ADL's and self-care      AM-PAC PT "6 Clicks" Mobility   Outcome Measure  Help needed turning from your back to your side while in a flat bed without using bedrails?: A Little Help needed moving from lying on your back to sitting on  the side of a flat bed without using bedrails?: A Lot Help needed moving to and from a bed to a chair (including a wheelchair)?: A Lot Help needed standing up from a chair using your arms (e.g., wheelchair or bedside chair)?: A Lot Help needed to walk in hospital room?: A Lot Help needed climbing 3-5 steps with a railing? : Total 6 Click Score: 12    End of Session Equipment Utilized During Treatment: Gait belt Activity Tolerance: Patient tolerated treatment well Patient left: in chair;with call bell/phone within reach;with chair alarm set;with family/visitor present (spouse in his  room) Nurse Communication: Mobility status;Other (comment) (L shoulder pain/weakness) PT Visit Diagnosis: Unsteadiness on feet (R26.81);Muscle weakness (generalized) (M62.81);Difficulty in walking, not elsewhere classified (R26.2);History of falling (Z91.81)     Time: 9604-5409 PT Time Calculation (min) (ACUTE ONLY): 25 min  Charges:    $Gait Training: 8-22 mins PT General Charges $$ ACUTE PT VISIT: 1 Visit                     Zandra Lajeunesse P., PTA Acute Rehabilitation Services Secure Chat Preferred 9a-5:30pm Office: [phone]    Mariel Shope [facility] 03/07/2024, 3:08 PM

## 2024-03-07 NOTE — TOC Progression Note (Addendum)
 Transition of Care [facility]) - Progression Note    Patient Details  Name: Tommy Green MRN: [id number] Date of Birth: 1946/02/05  Transition of Care [facility]) CM/SW Contact  Lamoyne Palencia A [place], LCSW Phone Number: 03/07/2024, 11:04 AM  Clinical Narrative:     Update 1418: CSW was contacted again by Cathleen Coach. She said that they are not able to take pt today as [employer] is still pending and will not be able to get pt's medications from pharmacy after 3pm. Pt and provider notified. EDD tomorrow.    Update 1405 CSW was reached back out to by [employer] at [facility]. Confirmed pt would need authorization approval before able to DC to facility, currently still pending. Provided room #, 4007, [employer]. Report # [phone].       Update 1237 CSW spoke with pt's spouse and pt at bedside. Informed them that delay in DC due to insurance authorization, currently pending. CSW has spoken and left vm with Cathleen Coach at [facility] with update, and waiting to hear back. DC summary sent to facility in event pt can DC today.     CSW started [employer] authorization as pt is medically stable for DC.  Pt needs to return with authorization approved to receive recommended therapy at [facility].   Auth currently pending, reference [id number]    TOC will continue to follow.        Expected Discharge Plan and Services         Expected Discharge Date: 03/07/24                                     Social Determinants of Health (SDOH) Interventions SDOH Screenings   Food Insecurity: No Food Insecurity (03/02/2024)  Housing: Low Risk  (03/02/2024)  Transportation Needs: No Transportation Needs (03/02/2024)  Utilities: Not At Risk (03/02/2024)  Alcohol Screen: Low Risk  (11/04/2023)  Depression (PHQ2-9): Low Risk  (01/03/2024)  Financial Resource Strain: Low Risk  (11/04/2023)  Physical Activity: Inactive (11/04/2023)  Social Connections: Moderately Integrated (03/02/2024)  Stress: No Stress Concern  Present (11/04/2023)  Tobacco Use: Medium Risk (03/05/2024)  Health Literacy: Adequate Health Literacy (11/04/2023)    Readmission Risk Interventions    01/16/2024   12:29 PM  Readmission Risk Prevention Plan  Transportation Screening Complete  PCP or Specialist Appt within 5-7 Days Complete  Home Care Screening Complete  Medication Review (RN CM) Complete

## 2024-03-07 NOTE — Progress Notes (Signed)
 Occupational Therapy Treatment Patient Details Name: Tommy Green MRN: [id number] DOB: 1946-06-02 Today's Date: 03/07/2024   History of present illness Pt is an 78 y/o M presenting to ED on 5/16 with BLE edema and weakness, dark stools, admitted for GI bleed. S/P small bowel enteroscopy5/19 AM showing "Noted grade 1 varices and small nonbleeding angioectasia". PMH includes HTN, GERD, colon polyps, hiatal hernia, RA, CVA, CKD IIIB, aortic stenosis s/p TAVR, recurrent falls, subdural hematoma, RUE DVT on Eliquis , hepatic abscess, anemia, esophageal varices.   OT comments  Patient received in supine and agreeable to OT/PT treatment. Patient demonstrated bed mobility with min assist but required mod assist +2 to complete due to assistance needed to scoot to EOB and assistance with trunk due to posterior leaning. Patient performed sit to stands from EOB with min assist +2 and from recliner with mod to max assist of one with education on hand placement and scooting forward in chair. Patient able to stand at sink for grooming tasks with min assist. Patient with complaints of LUE shoulder pain and nursing notified. Patient will benefit from continued inpatient follow up therapy, <3 hours/day. Acute OT to continue to follow to address established goals to facilitate DC to next venue of care.        If plan is discharge home, recommend the following:  A lot of help with walking and/or transfers;A lot of help with bathing/dressing/bathroom;Assistance with cooking/housework;Assist for transportation;Direct supervision/assist for financial management;Direct supervision/assist for medications management;Help with stairs or ramp for entrance   Equipment Recommendations  Other (comment) (defer)    Recommendations for Other Services      Precautions / Restrictions Precautions Precautions: Fall Recall of Precautions/Restrictions: Impaired Precaution/Restrictions Comments: waxing/waning  cognition Restrictions Weight Bearing Restrictions Per Provider Order: No       Mobility Bed Mobility Overal bed mobility: Needs Assistance Bed Mobility: Supine to Sit     Supine to sit: Mod assist, +2 for physical assistance     General bed mobility comments: bed pad used to assist with scooting forward assistance with trunk due to posterior leaning, patient with complaints of LUE shoulder pain with bed mobility    Transfers Overall transfer level: Needs assistance Equipment used: Rolling walker (2 wheels) Transfers: Sit to/from Stand, Bed to chair/wheelchair/BSC Sit to Stand: Mod assist, Min assist, +2 physical assistance           General transfer comment: cues for hand placement and scooting forward in chair, mod to max assist of one for sit to stand or min assist +2     Balance Overall balance assessment: Needs assistance Sitting-balance support: Feet supported, No upper extremity supported Sitting balance-Leahy Scale: Fair Sitting balance - Comments: posterior leaning initially but progressed to supervision   Standing balance support: During functional activity, Reliant on assistive device for balance, Bilateral upper extremity supported, Single extremity supported Standing balance-Leahy Scale: Poor Standing balance comment: reliant on RW or sink for support when standing, able to stand with one extremity support with grooming tasks but required min assist for balance                           ADL either performed or assessed with clinical judgement   ADL Overall ADL's : Needs assistance/impaired     Grooming: Wash/dry hands;Wash/dry face;Oral care;Minimal assistance;Standing Grooming Details (indicate cue type and reason): min assist with brushing teeth due to applying toothpaste and min assist for balance  Functional mobility during ADLs: Minimal assistance;+2 for physical assistance;Rolling walker (2  wheels)      Extremity/Trunk Assessment              Vision       Perception     Praxis     Communication Communication Communication: No apparent difficulties   Cognition Arousal: Alert Behavior During Therapy: Flat affect Cognition: Cognition impaired     Awareness: Online awareness impaired Memory impairment (select all impairments): Short-term memory, Working memory                       Following commands: Impaired Following commands impaired: Only follows one step commands consistently, Follows one step commands with increased time, Follows multi-step commands inconsistently      Cueing   Cueing Techniques: Verbal cues, Gestural cues, Tactile cues  Exercises      Shoulder Instructions       General Comments Nursing educated on sit to stands and transfers with patient, suggest Stedy if increased difficulty with transfer back to bed    Pertinent Vitals/ Pain       Pain Assessment Pain Assessment: Faces Faces Pain Scale: Hurts a little bit Pain Location: LUE shoulder Pain Descriptors / Indicators: Grimacing, Guarding Pain Intervention(s): Limited activity within patient's tolerance, Monitored during session  Home Living                                          Prior Functioning/Environment              Frequency  Min 2X/week        Progress Toward Goals  OT Goals(current goals can now be found in the care plan section)  Progress towards OT goals: Progressing toward goals  Acute Rehab OT Goals Patient Stated Goal: to get better OT Goal Formulation: With patient Time For Goal Achievement: 03/18/24 Potential to Achieve Goals: Good ADL Goals Pt Will Perform Upper Body Dressing: Independently;sitting Pt Will Perform Lower Body Dressing: Independently;sitting/lateral leans;sit to/from stand Pt Will Transfer to Toilet: Independently;ambulating;regular height toilet Pt Will Perform Tub/Shower Transfer: Tub  transfer;Shower transfer;Independently;ambulating  Plan      Co-evaluation    PT/OT/SLP Co-Evaluation/Treatment: Yes Reason for Co-Treatment: For patient/therapist safety;To address functional/ADL transfers   OT goals addressed during session: ADL's and self-care      AM-PAC OT "6 Clicks" Daily Activity     Outcome Measure   Help from another person eating meals?: A Little Help from another person taking care of personal grooming?: A Little Help from another person toileting, which includes using toliet, bedpan, or urinal?: A Lot Help from another person bathing (including washing, rinsing, drying)?: A Lot Help from another person to put on and taking off regular upper body clothing?: A Lot Help from another person to put on and taking off regular lower body clothing?: A Lot 6 Click Score: 14    End of Session Equipment Utilized During Treatment: Gait belt;Rolling walker (2 wheels)  OT Visit Diagnosis: Unsteadiness on feet (R26.81);Other abnormalities of gait and mobility (R26.89);Muscle weakness (generalized) (M62.81)   Activity Tolerance Patient tolerated treatment well   Patient Left in chair;with call bell/phone within reach;with chair alarm set;with family/visitor present   Nurse Communication Mobility status;Need for lift equipment        Time: 1610-9604 OT Time Calculation (min): 28 min  Charges: OT General Charges $  OT Visit: 1 Visit OT Treatments $Self Care/Home Management : 8-22 mins  Anitra Barn, OTA Acute Rehabilitation Services  Office [phone]   Jovita Nipper 03/07/2024, 2:44 PM

## 2024-03-07 NOTE — Discharge Summary (Addendum)
 Physician Discharge Summary  Tommy Green ZOX:[id number] DOB: December 21, 1945 DOA: 03/02/2024  PCP: Jobe Mulder, DO  Admit date: 03/02/2024 Discharge date: 03/08/2024  Time spent: 45 minutes  Recommendations for Outpatient Follow-up:  Infectious disease Dr. Shereen Dike in 1 to 2 weeks for follow-up of liver abscess PCP in 1 week CBC and BMP at follow-up   Discharge Diagnoses:  Upper GI bleed Acute blood loss anemia Esophageal varices Acute on chronic diastolic CHF Hypoalbuminemia Liver abscess   AKI (acute kidney injury) (HCC)   Essential hypertension   Hyperlipidemia associated with type 2 diabetes mellitus (HCC)   History of stroke   Acute on chronic anemia   Acute heart failure with preserved ejection fraction (HFpEF) (HCC)   AVM (arteriovenous malformation) of small bowel, acquired with hemorrhage   Discharge Condition: Improved  Diet recommendation: Los 1, heart healthy  Filed Weights   03/02/24 2100  Weight: 108 kg    History of present illness:  78 y.o. male with history h/o HTN, HLP, GERD, colon polyps, hiatal hernia, rheumatoid arthritis, CVA, CKD stage IIIb, aortic stenosis s/p TAVR, recurrent falls with history of subdural hematoma in the past, DVT of right upper extremity on Eliquis , hepatic abscess, chronic anemia, grade I esophageal varices, p/w bilateral leg swelling, dark stools, and worsening weakness.   Hospital Course:   Acute on chronic blood loss anemia Jejunal AVM Esophageal varices -Hemoglobin was 6.4 on admission, transfuse 1 unit PRBC, now stable in the mid 7-8 range -Seen by gastroenterology this admission, push enteroscopy 5/19 noted small jejunal AVM treated with APC, esophageal varices were noted without bleeding stigmata no other bleeding sources noted -Hemoglobin has stabilized, GI felt it was okay to resume Eliquis  however patient declined resuming Eliquis  at this time, recommend further discussions regarding risk-benefit at follow-up    Acute exacerbation of HFpEF - Noted 3+ BL LE pitting edema on presentation.  Echo last month showed preserved EF.  No previous history of lower extremity edema.  Responded  well to IV Lasix   - Net negative nearly 8 L since presentation.  Review of the edema could be multifactorial related to CHF and hypoalbuminemia and liver dysfunction.   - Transition to oral Lasix  40 Mg daily at discharge   Acute on chronic diarrhea with non-anion gap metabolic acidosis - CO2 16 on presentation.  Likely GI loss given multiple bowel movements daily.  Due to patient's antibiotic use, C. difficile ordered and subsequently negative.  Responding well to as needed loperamide . Recheck BMP and mag in AM.  Acidemia appears resolved.     AKI on CKD-3b -Cr above baseline.  Likely cardiorenal etiology.  Now plateauing in the low 2 range  Hypomagnesemia - Repleted    H/o Hepatic abscess (POA) - Noted on previous hospitalization, is on p.o. Cipro  plus Flagyl  (end date 5/22).  Previously scheduled to follow-up with infectious disease on Monday 5/19.  Continue p.o. antibiotics. - Imaging this admission only notes mild improvement in size of hepatic fluid collection, recommended to continue antibiotics for 1 more week and follow-up with the infectious disease team soon   History of DVT - Anticoagulation being held secondary to GI bleed above.  Recent history of DVT.  Discussion with patient's wife to discontinue Eliquis .  Doppler ultrasound showed no DVT at this time.   History of AS s/p TAVR -noted.   Hypoglycemia - Appears resolved.    Discharge Exam: Vitals:   03/08/24 0827 03/08/24 0844  BP: (!) 122/58 (!) 122/58  Pulse: 69  69  Resp: 16   Temp: (!) 97.5 F (36.4 C)   SpO2: 99%    Gen: Awake, Alert, Oriented X 2 HEENT: no JVD Lungs: Good air movement bilaterally, CTAB CVS: S1S2/RRR Abd: soft, Non tender, non distended, BS present Extremities: trace edema Skin: no new rashes on exposed skin    Discharge Instructions   Discharge Instructions     Diet - low sodium heart healthy   Complete by: As directed    Diet Carb Modified   Complete by: As directed    Discharge wound care:   Complete by: As directed    routine   Increase activity slowly   Complete by: As directed       Allergies as of 03/08/2024       Reactions   Canagliflozin Other (See Comments)   Pt reports that the use of Invokana caused acute KIDNEY FAILURE ([facility] records) and "allergic," per [facility]   Aspirin  Other (See Comments)   Gastroesophageal reflux and "allergic," per [facility]   Atorvastatin Other (See Comments)   Myalgias, GI upset, and "allergic," per [facility]   Statins Other (See Comments)   Muscle pain- "allergic," per [facility]        Medication List     STOP taking these medications    amLODipine  10 MG tablet Commonly known as: NORVASC    apixaban  5 MG Tabs tablet Commonly known as: ELIQUIS    aspirin  EC 81 MG tablet   lisinopril  40 MG tablet Commonly known as: ZESTRIL        TAKE these medications    acetaminophen  325 MG tablet Commonly known as: TYLENOL  Take 650 mg by mouth every 6 (six) hours as needed for mild pain or headache.   ciprofloxacin  500 MG tablet Commonly known as: CIPRO  Take 1 tablet (500 mg total) by mouth 2 (two) times daily for 7 days.   cyanocobalamin  1000 MCG tablet Commonly known as: VITAMIN B12 Take 1,000 mcg by mouth daily.   Enbrel  Mini 50 MG/ML injection Generic drug: etanercept  Inject 1 mL (50 mg total) into the skin once a week.   escitalopram  20 MG tablet Commonly known as: Lexapro  Take 1 tablet (20 mg total) by mouth daily.   ezetimibe  10 MG tablet Commonly known as: ZETIA  Take 1 tablet (10 mg total) by mouth daily. What changed: when to take this   fenofibrate  micronized 200 MG capsule Commonly known as: LOFIBRA TAKE 1 CAPSULE DAILY BEFOREBREAKFAST   finasteride  5 MG tablet Commonly known as: PROSCAR  Take 1 tablet (5 mg total) by mouth  daily. What changed: when to take this   fluticasone  50 MCG/ACT nasal spray Commonly known as: FLONASE  Place 2 sprays into both nostrils daily. What changed: when to take this   furosemide  20 MG tablet Commonly known as: LASIX  Take 2 tablets (40 mg total) by mouth daily. What changed: how much to take   insulin  lispro 100 UNIT/ML injection Commonly known as: HUMALOG Inject 0-10 Units into the skin 2 (two) times daily with a meal. Inject 0-10 units into the skin before meals, PER SLIDING SCALE: BGL 0-200 = give nothing, 201-250 = 2 units, 251-300 = 4 units, 301-350 = 6 units, 351-400 = 8 units, >400 = 10 units, push fluids, and repeat BGL check in 2 hours. If BGL remains >400 after 2 hours, CALL MD.   leflunomide  10 MG tablet Commonly known as: ARAVA  Take 1 tablet (10 mg total) by mouth daily.   loperamide  2 MG tablet Commonly known as: IMODIUM   A-D Take 2 mg by mouth as needed for diarrhea or loose stools.   magnesium  oxide 400 MG tablet Commonly known as: MAG-OX Take 400 mg by mouth daily with lunch.   melatonin 5 MG Tabs Take 5 mg by mouth at bedtime.   metoprolol  tartrate 25 MG tablet Commonly known as: LOPRESSOR  Take 0.5 tablets (12.5 mg total) by mouth 2 (two) times daily.   metroNIDAZOLE  500 MG tablet Commonly known as: FLAGYL  Take 1 tablet (500 mg total) by mouth 2 (two) times daily for 7 days.   multivitamin with minerals tablet Take 1 tablet by mouth at bedtime.   Omega 3 1000 MG Caps Take 2,000 mg by mouth daily with lunch.   omeprazole  20 MG capsule Commonly known as: PRILOSEC Take 1 capsule (20 mg total) by mouth daily. What changed: when to take this   potassium citrate 10 MEQ (1080 MG) SR tablet Commonly known as: UROCIT-K Take 10 mEq by mouth 2 (two) times daily.   tamsulosin  0.4 MG Caps capsule Commonly known as: FLOMAX  Take 0.8 mg by mouth at bedtime.   traZODone  100 MG tablet Commonly known as: DESYREL  Take 100 mg by mouth at bedtime.    Tresiba  FlexTouch 100 UNIT/ML FlexTouch Pen Generic drug: insulin  degludec Inject 20 Units into the skin daily.   Vitamin D3 Super Strength 50 MCG (2000 UT) Caps Generic drug: Cholecalciferol  Take 2,000 Units by mouth daily.   vitamin E  180 MG (400 UNITS) capsule Take 400 Units by mouth daily.               Discharge Care Instructions  (From admission, onward)           Start     Ordered   03/07/24 0000  Discharge wound care:       Comments: routine   03/07/24 1022           Allergies  Allergen Reactions   Canagliflozin Other (See Comments)    Pt reports that the use of Invokana caused acute KIDNEY FAILURE ([facility] records) and "allergic," per [facility]   Aspirin  Other (See Comments)    Gastroesophageal reflux and "allergic," per [facility]    Atorvastatin Other (See Comments)    Myalgias, GI upset, and "allergic," per [facility]   Statins Other (See Comments)    Muscle pain- "allergic," per [facility]    Contact information for after-discharge care     Destination     [facility] .   Service: Skilled Nursing Contact information: [address] [place]  [address] [phone]                      The results of significant diagnostics from this hospitalization (including imaging, microbiology, ancillary and laboratory) are listed below for reference.    Significant Diagnostic Studies: DG Shoulder Left Result Date: 03/07/2024 EXAM: 3 VIEW(S) XRAY OF THE LEFT SHOULDER 03/07/2024 03:32:00 PM COMPARISON: CT of the chest 03/02/2024 and 2-view chest x-ray 01/22/2022. CLINICAL HISTORY: L shoulder pain FINDINGS: BONES AND JOINTS: Glenohumeral joint is normally aligned. No evidence of acute fracture or dislocation. Degenerative changes are present at the acromion and greater tuberosity. SOFT TISSUES: No abnormal calcifications. Visualized lung is unremarkable. IMPRESSION: 1. No acute findings. 2. Degenerative changes at the acromion and  greater tuberosity. Electronically signed by: Audree Leas MD 03/07/2024 07:20 PM EDT RP Workstation: [id number]   VAS US  LOWER EXTREMITY VENOUS (DVT) Result Date: 03/05/2024  Lower Venous DVT Study Patient Name:  Tommy Green  Date of Exam:   03/05/2024 Medical Rec #: [id number]          Accession #:    [id number] Date of Birth: [birth year]           Patient Gender: M Patient Age:   56 years Exam Location:  [facility] Procedure:      VAS US  LOWER EXTREMITY VENOUS (DVT) Referring Phys: Michela Aguas --------------------------------------------------------------------------------  Indications: Edema.  Anticoagulation: Eliquis  (prior to admission). Limitations: Body habitus and poor ultrasound/tissue interface. Comparison Study: No previous exams Performing Technologist: Jody Hill RVT, RDMS  Examination Guidelines: A complete evaluation includes B-mode imaging, spectral Doppler, color Doppler, and power Doppler as needed of all accessible portions of each vessel. Bilateral testing is considered an integral part of a complete examination. Limited examinations for reoccurring indications may be performed as noted. The reflux portion of the exam is performed with the patient in reverse Trendelenburg.  +---------+---------------+---------+-----------+----------+--------------+ RIGHT    CompressibilityPhasicitySpontaneityPropertiesThrombus Aging +---------+---------------+---------+-----------+----------+--------------+ CFV      Full           Yes      Yes                                 +---------+---------------+---------+-----------+----------+--------------+ SFJ      Full                                                        +---------+---------------+---------+-----------+----------+--------------+ FV Prox  Full           Yes      Yes                                 +---------+---------------+---------+-----------+----------+--------------+ FV Mid   Full            Yes      Yes                                 +---------+---------------+---------+-----------+----------+--------------+ FV DistalFull           Yes      Yes                                 +---------+---------------+---------+-----------+----------+--------------+ PFV      Full                                                        +---------+---------------+---------+-----------+----------+--------------+ POP      Full           Yes      Yes                                 +---------+---------------+---------+-----------+----------+--------------+ PTV      Full                                                        +---------+---------------+---------+-----------+----------+--------------+  PERO     Full                                                        +---------+---------------+---------+-----------+----------+--------------+   +---------+---------------+---------+-----------+----------+--------------+ LEFT     CompressibilityPhasicitySpontaneityPropertiesThrombus Aging +---------+---------------+---------+-----------+----------+--------------+ CFV      Full           Yes      Yes                                 +---------+---------------+---------+-----------+----------+--------------+ SFJ      Full                                                        +---------+---------------+---------+-----------+----------+--------------+ FV Prox  Full           Yes      Yes                                 +---------+---------------+---------+-----------+----------+--------------+ FV Mid   Full           Yes      Yes                                 +---------+---------------+---------+-----------+----------+--------------+ FV DistalFull           Yes      Yes                                 +---------+---------------+---------+-----------+----------+--------------+ PFV      Full                                                         +---------+---------------+---------+-----------+----------+--------------+ POP      Full           Yes      Yes                                 +---------+---------------+---------+-----------+----------+--------------+ PTV      Full                                                        +---------+---------------+---------+-----------+----------+--------------+ PERO     Full                                                        +---------+---------------+---------+-----------+----------+--------------+  Summary: BILATERAL: - No evidence of deep vein thrombosis seen in the lower extremities, bilaterally. -No evidence of popliteal cyst, bilaterally. -Subcutaneous edema, bilaterally.  *See table(s) above for measurements and observations. Electronically signed by Runell Countryman on 03/05/2024 at 3:45:14 PM.    Final    VAS US  UPPER EXTREMITY VENOUS DUPLEX Result Date: 03/05/2024 UPPER VENOUS STUDY  Patient Name:  Tommy Green  Date of Exam:   03/05/2024 Medical Rec #: [id number]          Accession #:    [id number] Date of Birth: [birth year]           Patient Gender: M Patient Age:   78 years Exam Location:  [facility] Procedure:      VAS US  UPPER EXTREMITY VENOUS DUPLEX Referring Phys: Michela Aguas --------------------------------------------------------------------------------  Indications: Follow up exam Anticoagulation: Eliquis  (prior to admission). Comparison Study: Previous exam on 01/19/2024 SVT of basilic and cephalic veins,                   DVT of radial vein Performing Technologist: Jody Hill RVT, RDMS  Examination Guidelines: A complete evaluation includes B-mode imaging, spectral Doppler, color Doppler, and power Doppler as needed of all accessible portions of each vessel. Bilateral testing is considered an integral part of a complete examination. Limited examinations for reoccurring indications may be performed as noted.  Right Findings:  +----------+------------+---------+-----------+----------+-------+ RIGHT     CompressiblePhasicitySpontaneousPropertiesSummary +----------+------------+---------+-----------+----------+-------+ IJV           Full       Yes       Yes                      +----------+------------+---------+-----------+----------+-------+ Subclavian    Full       Yes       Yes                      +----------+------------+---------+-----------+----------+-------+ Axillary      Full       Yes       Yes                      +----------+------------+---------+-----------+----------+-------+ Brachial      Full       Yes       Yes                      +----------+------------+---------+-----------+----------+-------+ Radial        Full                                          +----------+------------+---------+-----------+----------+-------+ Ulnar         Full                                          +----------+------------+---------+-----------+----------+-------+ Cephalic      Full                                          +----------+------------+---------+-----------+----------+-------+ Basilic       Full       Yes       Yes                      +----------+------------+---------+-----------+----------+-------+  Left Findings: +----------+------------+---------+-----------+----------+-------+ LEFT      CompressiblePhasicitySpontaneousPropertiesSummary +----------+------------+---------+-----------+----------+-------+ Subclavian    Full       Yes       Yes                      +----------+------------+---------+-----------+----------+-------+  Summary:  Right: No evidence of deep vein thrombosis in the upper extremity. No evidence of superficial vein thrombosis in the upper extremity.  Left: No evidence of thrombosis in the subclavian.  *See table(s) above for measurements and observations.  Diagnosing physician: Runell Countryman Electronically signed by Runell Countryman on 03/05/2024 at 3:44:58 PM.    Final    CT ABDOMEN PELVIS WO CONTRAST Result Date: 03/03/2024 CLINICAL DATA:  Blunt trauma yesterday, fell, with anemia. Looking for retroperitoneal bleed. EXAM: CT ABDOMEN AND PELVIS WITHOUT CONTRAST TECHNIQUE: Multidetector CT imaging of the abdomen and pelvis was performed following the standard protocol without IV contrast. RADIATION DOSE REDUCTION: This exam was performed according to the departmental dose-optimization program which includes automated exposure control, adjustment of the mA and/or kV according to patient size and/or use of iterative reconstruction technique. COMPARISON:  CT chest, abdomen and pelvis without contrast yesterday at 6:28 p.m., CT abdomen and pelvis without contrast 02/14/2024. FINDINGS: Lower chest: There are minimal bilateral layering pleural effusions new since yesterday's exam. There is mild interstitial edema in both lung bases also new, possibly due to fluid overload congestive. There is increased bilateral lower lobe bronchial thickening which could be bronchitic or congestive. No focal infiltrate is seen. The heart is slightly enlarged. The cardiac blood pool is less dense than the myocardium consistent with anemia. There is a TAVR, and calcification in the coronary arteries and mitral ring. Small hiatal hernia. Hepatobiliary: There is a 2.2 cm hypodense lesion in hepatic segment 5, evaluated on MRI 01/16/2024 and thought to be infectious, on MRI was about double the size, unchanged since yesterday, was 2.5 cm on 02/14/2024. There is a stable 1 cm nonspecific hypodensity in segment 8 on 3:20, indeterminate. Rest of the unenhanced liver is homogeneous. The gallbladder is absent without biliary dilatation. Pancreas: Unremarkable without contrast. Spleen: Unremarkable without contrast. Adrenals/Urinary Tract: No adrenal mass. 2.3 cm Bosniak 1 cyst superior pole left kidney requires no follow-up imaging. No other contour deforming  abnormality of the unenhanced kidneys is seen. There is no urinary stone or obstruction. No bladder thickening. Stomach/Bowel: Negative for dilatation or wall thickening. Uncomplicated sigmoid diverticulosis. Normal appendix. Vascular/Lymphatic: Aortic atherosclerosis. No enlarged abdominal or pelvic lymph nodes. Reproductive: Prostate is unremarkable. Other: Bilateral fat hernias. No incarcerated hernia. Trace presacral ascites. There is no free hemorrhage. Body wall edema again seen in the flanks and thighs. Musculoskeletal: Bilateral rod and pedicle screw fusion construct T10-S1 is again noted with bilateral sacroiliac joint bolts. 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 Osteopenia. IMPRESSION: 1. No evidence of retroperitoneal bleed. 2. Minimal bilateral pleural effusions and mild interstitial edema in the lung bases, new since yesterday's exam, possibly due to fluid overload or congestive. 3. Increased bilateral lower lobe bronchial thickening which could be bronchitic or congestive. 4. Mild cardiomegaly with TAVR. Aortic and coronary artery atherosclerosis. 5. 2.2 cm hypodense lesion in hepatic segment 5, evaluated on MRI 01/16/2024 and thought to be infectious, unchanged since yesterday, was 2.5 cm on 02/14/2024. 6. 1 cm nonspecific hypodensity in segment 8 of the liver, unchanged. 7. Trace presacral ascites. 8. Body wall edema in the flanks and thighs. 9. Diverticulosis without evidence of  diverticulitis. 10. Osteopenia, postsurgical and degenerative change. Aortic Atherosclerosis (ICD10-I70.0).  Or Electronically Signed   By: Denman Fischer M.D.   On: 03/03/2024 07:03   CT CHEST ABDOMEN PELVIS WO CONTRAST Result Date: 03/02/2024 CLINICAL DATA:  Blunt trauma EXAM: CT CHEST, ABDOMEN AND PELVIS WITHOUT CONTRAST TECHNIQUE: Multidetector CT imaging of the chest, abdomen and pelvis was performed following the standard protocol without IV contrast. RADIATION DOSE REDUCTION: This exam was  performed according to the departmental dose-optimization program which includes automated exposure control, adjustment of the mA and/or kV according to patient size and/or use of iterative reconstruction technique. COMPARISON:  CT 02/14/2024, 01/15/2024, MRI 01/16/2024 FINDINGS: CT CHEST FINDINGS Cardiovascular: Limited evaluation without intravenous contrast.aortic valve prosthesis. Dense mitral calcification. Coronary vascular calcification. Normal cardiac size. No pericardial effusion Mediastinum/Nodes: Patent trachea. No thyroid mass. No suspicious lymph nodes. Esophagus within normal limits. Lungs/Pleura: Lungs are clear. No pleural effusion or pneumothorax. Subpleural left lower lobe pulmonary nodule measures 6 mm on series 4, image 79, previously 7 mm. Mild dependent atelectasis Musculoskeletal: Sternum appears intact. No acute osseous abnormality. CT ABDOMEN PELVIS FINDINGS Hepatobiliary: Cholecystectomy. No biliary dilatation. Further decrease in size of a inferior right hepatic lobe hypodensity, now measuring 11 mm maximum on series 3, image 68, previously 2.5 cm. Pancreas: Unremarkable. No pancreatic ductal dilatation or surrounding inflammatory changes. Spleen: Normal in size without focal abnormality. Adrenals/Urinary Tract: Adrenal glands are normal. Slight prominence of right renal pelvis and ureter but no obstructing stone. The bladder is normal Stomach/Bowel: Stomach nonenlarged. No dilated small bowel. Diffuse fluid in the colon. Negative appendix. No acute bowel wall thickening. Mild diverticular disease of the left colon Vascular/Lymphatic: Aortic atherosclerosis. No enlarged abdominal or pelvic lymph nodes. Reproductive: Prostate is unremarkable. Other: Negative for pelvic effusion or free air. Subcutaneous edema at the bilateral hips. Musculoskeletal: Posterior spinal fusion hardware extends from T10 to the upper sacrum. There are bilateral sacroiliac screws. No acute osseous abnormality.  IMPRESSION: 1. Negative for acute intrathoracic, intra-abdominal, or intrapelvic abnormality. 2. Diffuse fluid in the colon suggesting diarrheal illness. Mild diverticular disease of the left colon without acute inflammatory process. 3. Further decrease in size of inferior right hepatic lobe hypodensity, now measuring 11 mm, previously 2.5 cm. 4. 6 mm left lower lobe pulmonary nodule, previously 7 mm. This is grossly stable as compared with November 2024 limited lung bases on abdominal CT. Future CT at 18-24 months (from 08/21/2023 scan) is considered optional for low-risk patients, but is recommended for high-risk patients. This recommendation follows the consensus statement: Guidelines for Management of Incidental Pulmonary Nodules Detected on CT Images: From the Fleischner Society 2017; Radiology 2017; 284:228-243. 5. Aortic atherosclerosis. Aortic Atherosclerosis (ICD10-I70.0). Electronically Signed   By: Esmeralda Hedge M.D.   On: 03/02/2024 19:02   CT HEAD WO CONTRAST ( ) Result Date: 03/02/2024 CLINICAL DATA:  Head trauma abnormal mental status EXAM: CT HEAD WITHOUT CONTRAST TECHNIQUE: Contiguous axial images were obtained from the base of the skull through the vertex without intravenous contrast. RADIATION DOSE REDUCTION: This exam was performed according to the departmental dose-optimization program which includes automated exposure control, adjustment of the mA and/or kV according to patient size and/or use of iterative reconstruction technique. COMPARISON:  CT brain 01/15/2024, 12/09/2021, MRI 03/06/2021 FINDINGS: Brain: No acute territorial infarction, hemorrhage, or intracranial mass. Chronic encephalomalacia at the inferolateral left frontal lobe. Chronic left dural thickening. Stable ventricle size. Atrophy and chronic small vessel ischemic changes of the white matter Vascular: No hyperdense vessels.  Carotid vascular calcification  Skull: Left-sided craniotomy.  No fracture Sinuses/Orbits: No acute  finding. Other: None IMPRESSION: 1. No CT evidence for acute intracranial abnormality. 2. Atrophy and chronic small vessel ischemic changes of the white matter. Chronic encephalomalacia at the inferolateral left frontal lobe. Electronically Signed   By: Esmeralda Hedge M.D.   On: 03/02/2024 18:43   CT ABDOMEN PELVIS WO CONTRAST Result Date: 02/16/2024 EXAMINATION: CT ABDOMEN PELVIS WO CONTRAST CLINICAL INDICATION: Male, 78 years old. f/u liver abscesses. TECHNIQUE: Helical CT scan examination of the abdomen and pelvis is performed from the domes of the diaphragm to the pubic symphysis. Limited evaluation of the solid organs due to lack of intravenous contrast. Unless otherwise specified, incidental thyroid, adrenal, renal lesions do not require dedicated imaging follow up. Additionally, any mentioned pulmonary nodules do not require dedicated imaging follow-up based on the Fleischner guidelines unless otherwise specified. Coronary calcifications are not identified unless otherwise specified. COMPARISON: 01/24/2024 FINDINGS: There are subsegmental atelectatic changes within the lung bases. The heart is normal in size. There are coronary calcifications. Transcatheter aortic valve replacement noted. There is a small hiatal hernia. Previously noted hypoattenuating lesion in hepatic segment 5/6 is decreased in size now measuring 2.5 cm (previously measuring 3.1 cm when measured in similar fashion on prior). The gallbladder is surgically absent. The spleen is normal. The pancreas is normal. Adrenals are normal. Left renal cyst is seen. The prostate is enlarged and the urinary bladder is distended with mild bilateral hydroureteronephrosis. The abdominal aorta is normal in caliber. Scattered atherosclerotic changes are present. There is colonic diverticulosis. The appendix is normal. Large and small bowel loops are otherwise within normal limits. There is no free fluid or pathologic lymphadenopathy by size criteria.  Thoracolumbar/sacral fusion noted. There are degenerative changes of the spine and bony pelvis. IMPRESSION: Evidence liver lesion in the inferior right lobe appears decreased in size compared to prior. Enlarged prostate with distended urinary bladder with mild bilateral hydroureteronephrosis. Other findings as above. DOSE REDUCTION: This exam was performed according to our departmental dose-optimization program which includes automated exposure control, adjustment of the mA and/or kV according to patient size and/or use of iterative reconstruction technique. Electronically signed by: [place] Engel MD 02/16/2024 10:59 AM EDT RP Workstation: [id number]    Microbiology: Recent Results (from the past 240 hours)  MRSA Next Gen by PCR, Nasal     Status: Abnormal   Collection Time: 03/03/24  2:32 AM   Specimen: Nasal Mucosa; Nasal Swab  Result Value Ref Range Status   MRSA by PCR Next Gen DETECTED (A) NOT DETECTED Final    Comment: RESULT CALLED TO, READ BACK BY AND VERIFIED WITH: W STANLEY RN 03/03/2024 @ 0500 BY AB (NOTE) The GeneXpert MRSA Assay (FDA approved for NASAL specimens only), is one component of a comprehensive MRSA colonization surveillance program. It is not intended to diagnose MRSA infection nor to guide or monitor treatment for MRSA infections. Test performance is not FDA approved in patients less than 16 years old. Performed at [facility], [address] [place], [place] [address]   C Difficile Quick Screen w PCR reflex     Status: None   Collection Time: 03/03/24  1:14 PM   Specimen: STOOL  Result Value Ref Range Status   C Diff antigen NEGATIVE NEGATIVE Final   C Diff toxin NEGATIVE NEGATIVE Final   C Diff interpretation No C. difficile detected.  Final    Comment: Performed at [facility], [address], [place],  [place] [address]     Labs: Basic Metabolic Panel: Recent Labs  Lab 03/03/24 0528 03/03/24 1429 03/05/24 0409 03/06/24 0745 03/07/24 0552  03/08/24 0624  NA 141  --  137 137 135 135  K 4.2  --  4.5 3.9 3.6 3.5  CL 115*  --  109 107 105 106  CO2 17*  --  21* 22 22 20*  GLUCOSE 64*  --  105* 129* 140* 120*  BUN 45*  --  38* 35* 39* 44*  CREATININE 2.14*  --  2.16* 2.21* 2.27* 2.34*  CALCIUM 8.2*  --  8.8* 8.5* 8.2* 8.3*  MG  --  1.5* 1.7 1.7 1.5*  --    Liver Function Tests: Recent Labs  Lab 03/02/24 1354 03/05/24 0409 03/07/24 0552  AST 24 215* 59*  ALT 15 53* 43  ALKPHOS 51 135* 117  BILITOT 0.4 1.7* 0.7  PROT 5.3* 5.6* 5.5*  ALBUMIN  2.4* 2.4* 2.4*   Recent Labs  Lab 03/02/24 1354  LIPASE 41   No results for input(s): "AMMONIA" in the last 168 hours. CBC: Recent Labs  Lab 03/02/24 1354 03/02/24 2208 03/03/24 0528 03/05/24 0409 03/05/24 1025 03/05/24 1838 03/06/24 0745 03/06/24 1047 03/08/24 0624  WBC 8.2 8.2  --  12.7*  --   --  10.3  --  9.4  NEUTROABS 5.6  --   --   --   --   --   --   --   --   HGB 6.4* 7.1*   < > 8.1* 7.8* 7.9* 7.8* 7.7* 7.8*  HCT 20.5* 21.6*   < > 24.8* 24.6* 24.5* 23.9* 23.3* 24.4*  MCV 85.8 82.4  --  82.7  --   --  82.1  --  82.4  PLT 262 240  --  256  --   --  262  --  234   < > = values in this interval not displayed.   Cardiac Enzymes: No results for input(s): "CKTOTAL", "CKMB", "CKMBINDEX", "TROPONINI" in the last 168 hours. BNP: BNP (last 3 results) Recent Labs    03/02/24 1354  BNP 102.1*    ProBNP (last 3 results) No results for input(s): "PROBNP" in the last 8760 hours.  CBG: Recent Labs  Lab 03/07/24 0847 03/07/24 1211 03/07/24 1649 03/08/24 0826 03/08/24 1212  GLUCAP 141* 172* 175* 129* 261*       Signed:  [name] MD.  [facility] 03/08/2024, 12:41 PM

## 2024-03-07 NOTE — Progress Notes (Signed)
 Psychosocial Progressive/Outcome: ANOx3/4 at times; intermittent confused  Calm and cooperative Wife at bedside   Pain/Comfort Progression/Outcome: Pt started complaining of pain while urinating - UA ordered   Clinical Progression/Outcome: Adequate fluid and diet intake  Turned and repositioned during shift  Worked w/ PT  X2 assist w/ walker  X1 assist to turn in bed  Walked in hallway Voids to urinal/bepan  Had multi BM in bedpan Sat up in chair  Slept in between care Maintained safety

## 2024-03-07 NOTE — Care Management Important Message (Signed)
 Important Message  Patient Details  Name: Tommy Green MRN: [id number] Date of Birth: [birth year]   Important Message Given:  Yes - Medicare IM     Wynonia Hedges 03/07/2024, 2:52 PM

## 2024-03-07 NOTE — Plan of Care (Signed)

## 2024-03-08 ENCOUNTER — Ambulatory Visit: Admitting: Infectious Diseases

## 2024-03-08 LAB — URINALYSIS, ROUTINE W REFLEX MICROSCOPIC
Bilirubin Urine: NEGATIVE
Glucose, UA: 150 mg/dL — AB
Hgb urine dipstick: NEGATIVE
Ketones, ur: NEGATIVE mg/dL
Nitrite: NEGATIVE
Protein, ur: NEGATIVE mg/dL
Specific Gravity, Urine: 1.01 (ref 1.005–1.030)
WBC, UA: >50 WBC/hpf (ref 0–5)
pH: 5 (ref 5.0–8.0)

## 2024-03-08 LAB — CBC
HCT: 24.4 % — ABNORMAL LOW (ref 39.0–52.0)
Hemoglobin: 7.8 g/dL — ABNORMAL LOW (ref 13.0–17.0)
MCH: 26.4 pg (ref 26.0–34.0)
MCHC: 32 g/dL (ref 30.0–36.0)
MCV: 82.4 fL (ref 80.0–100.0)
Platelets: 234 10*3/uL (ref 150–400)
RBC: 2.96 MIL/uL — ABNORMAL LOW (ref 4.22–5.81)
RDW: 18.5 % — ABNORMAL HIGH (ref 11.5–15.5)
WBC: 9.4 10*3/uL (ref 4.0–10.5)
nRBC: 0 % (ref 0.0–0.2)

## 2024-03-08 LAB — BASIC METABOLIC PANEL WITH GFR
Anion gap: 9 (ref 5–15)
BUN: 44 mg/dL — ABNORMAL HIGH (ref 8–23)
CO2: 20 mmol/L — ABNORMAL LOW (ref 22–32)
Calcium: 8.3 mg/dL — ABNORMAL LOW (ref 8.9–10.3)
Chloride: 106 mmol/L (ref 98–111)
Creatinine, Ser: 2.34 mg/dL — ABNORMAL HIGH (ref 0.61–1.24)
GFR, Estimated: 27 mL/min — ABNORMAL LOW (ref >60)
Glucose, Bld: 120 mg/dL — ABNORMAL HIGH (ref 70–99)
Potassium: 3.5 mmol/L (ref 3.5–5.1)
Sodium: 135 mmol/L (ref 135–145)

## 2024-03-08 LAB — GLUCOSE, CAPILLARY
Glucose-Capillary: 129 mg/dL — ABNORMAL HIGH (ref 70–99)
Glucose-Capillary: 261 mg/dL — ABNORMAL HIGH (ref 70–99)
Glucose-Capillary: 159 mg/dL — ABNORMAL HIGH (ref 70–99)
Glucose-Capillary: 238 mg/dL — ABNORMAL HIGH (ref 70–99)

## 2024-03-08 LAB — SURGICAL PATHOLOGY

## 2024-03-08 NOTE — TOC Progression Note (Addendum)
 Transition of Care [facility]) - Progression Note    Patient Details  Name: Tommy Green MRN: [id number] Date of Birth: Feb 16, 1946  Transition of Care [facility]) CM/SW Contact  Shronda Boeh A [place], LCSW Phone Number: 03/08/2024, 10:38 AM  Clinical Narrative:     Update 1421: Auth still pending for insurance.  Pt cannot DC to [facility] until authorization approved.   Siegfried Dress is still pending for insurance at this time.     TOC will continue to follow.     Expected Discharge Plan and Services         Expected Discharge Date: 03/07/24                                     Social Determinants of Health (SDOH) Interventions SDOH Screenings   Food Insecurity: No Food Insecurity (03/02/2024)  Housing: Low Risk  (03/02/2024)  Transportation Needs: No Transportation Needs (03/02/2024)  Utilities: Not At Risk (03/02/2024)  Alcohol Screen: Low Risk  (11/04/2023)  Depression (PHQ2-9): Low Risk  (01/03/2024)  Financial Resource Strain: Low Risk  (11/04/2023)  Physical Activity: Inactive (11/04/2023)  Social Connections: Moderately Integrated (03/02/2024)  Stress: No Stress Concern Present (11/04/2023)  Tobacco Use: Medium Risk (03/05/2024)  Health Literacy: Adequate Health Literacy (11/04/2023)    Readmission Risk Interventions    01/16/2024   12:29 PM  Readmission Risk Prevention Plan  Transportation Screening Complete  PCP or Specialist Appt within 5-7 Days Complete  Home Care Screening Complete  Medication Review (RN CM) Complete

## 2024-03-08 NOTE — Plan of Care (Signed)

## 2024-03-08 NOTE — Progress Notes (Signed)
 Patient seen and examined, no changes from a discharge summary yesterday, remains medically stable for discharge to ALF, awaiting insurance authorization  [name], MD

## 2024-03-08 NOTE — Progress Notes (Signed)
 Physical Therapy Treatment Patient Details Name: Tommy Green MRN: [id number] DOB: 01-23-1946 Today's Date: 03/08/2024   History of Present Illness Pt is an 78 y/o M presenting to ED on 5/16 with BLE edema and weakness, dark stools, admitted for GI bleed. S/P small bowel enteroscopy 5/19 AM showing "Noted grade 1 varices and small nonbleeding angioectasia". Of note, pt c/o L shoulder pain, 5/21 imaging showing "  1. No acute findings.  2. 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    PT Comments  Pt received in chair, agreeable to therapy session, spouse present and encouraging. Pt limited due to urinary and bowel urgency and not able to tolerate prolonged standing due to this, so emphasis on transfer safety. Pt needing up to minA for transfers with Octaviano Belts this date and pt resting on [facility] with spouse present for pt safety and agreeable to notify nursing staff once pt done with bowel movement, with Stedy locked in front of [facility] for pt safety and call bell in reach, RN also notified. Patient will benefit from continued inpatient follow up therapy, <3 hours/day.    If plan is discharge home, recommend the following: A lot of help with walking and/or transfers;A lot of help with bathing/dressing/bathroom   Can travel by private vehicle     Yes  Equipment Recommendations  Rolling walker (2 wheels);Wheelchair (measurements PT);Wheelchair cushion (measurements PT)    Recommendations for Other Services       Precautions / Restrictions Precautions Precautions: Fall Recall of Precautions/Restrictions: Impaired Precaution/Restrictions Comments: waxing/waning cognition Restrictions Weight Bearing Restrictions Per Provider Order: No     Mobility  Bed Mobility Overal bed mobility:  Needs Assistance             General bed mobility comments: pt received in recliner and up on BSC at end of session    Transfers Overall transfer level: Needs assistance Equipment used: Ambulation equipment used Transfers: Sit to/from Stand, Bed to chair/wheelchair/BSC Sit to Stand: Min assist, +2 safety/equipment           General transfer comment: recliner>stedy flaps>BSC due to pt c/o bowel urgency. Transfer via Lift Equipment: Stedy  Ambulation/Gait               General Gait Details: pt needing time on [facility] so RN notified and pt sitting with Stedy locked in front of him and spouse in room to notify RN when pt done on [facility]   Stairs             Wheelchair Mobility     Tilt Bed    Modified Rankin (Stroke Patients Only)       Balance Overall balance assessment: Needs assistance Sitting-balance support: Feet supported, No upper extremity supported Sitting balance-Leahy Scale: Poor Sitting balance - Comments: poor dynamic seated balance, fair static sitting pillow placed behind his back on BSC for comfort   Standing balance support: During functional activity, Reliant on assistive device for balance, Bilateral upper extremity supported, Single extremity supported Standing balance-Leahy Scale: Poor Standing balance comment: +1 external assist and stedy support                            Communication Communication Communication: No apparent difficulties  Cognition Arousal: Alert Behavior During Therapy: Flat affect   PT -  Cognitive impairments: Memory, Sequencing, Problem solving, Safety/Judgement                       PT - Cognition Comments: Pt sleeping but awakens to voice; c/o bowel urgency so utilized [facility] for pt safety and efficiency of transfer due to pt urgency. Following commands: Impaired Following commands impaired: Only follows one step commands consistently, Follows one step commands with increased time, Follows  multi-step commands inconsistently    Cueing Cueing Techniques: Verbal cues, Gestural cues, Tactile cues  Exercises      General Comments General comments (skin integrity, edema, etc.): spouse asking about urinalysis, RN notified      Pertinent Vitals/Pain Pain Assessment Pain Assessment: Faces Faces Pain Scale: Hurts a little bit Pain Location: LUE shoulder Pain Descriptors / Indicators: Guarding, Sore Pain Intervention(s): Monitored during session, Repositioned    Home Living                          Prior Function            PT Goals (current goals can now be found in the care plan section) Acute Rehab PT Goals Patient Stated Goal: return to [facility] PT Goal Formulation: With patient Time For Goal Achievement: 03/18/24 Progress towards PT goals: Progressing toward goals    Frequency    Min 2X/week      PT Plan      Co-evaluation              AM-PAC PT "6 Clicks" Mobility   Outcome Measure  Help needed turning from your back to your side while in a flat bed without using bedrails?: A Little Help needed moving from lying on your back to sitting on the side of a flat bed without using bedrails?: A Lot Help needed moving to and from a bed to a chair (including a wheelchair)?: A Lot Help needed standing up from a chair using your arms (e.g., wheelchair or bedside chair)?: A Lot Help needed to walk in hospital room?: A Lot Help needed climbing 3-5 steps with a railing? : Total 6 Click Score: 12    End of Session Equipment Utilized During Treatment: Gait belt Activity Tolerance: Patient tolerated treatment well;Other (comment) (bowel urgency limiting) Patient left: in chair;with call bell/phone within reach;with family/visitor present (spouse in his room, stedy locked in front of him for pt safety while on [facility]) Nurse Communication: Mobility status;Other (comment);Need for lift equipment (L shoulder pain/weakness, pt on BSC) PT Visit Diagnosis:  Unsteadiness on feet (R26.81);Muscle weakness (generalized) (M62.81);Difficulty in walking, not elsewhere classified (R26.2);History of falling (Z91.81)     Time: 1191-4782 PT Time Calculation (min) (ACUTE ONLY): 14 min  Charges:    $Therapeutic Activity: 8-22 mins PT General Charges $$ ACUTE PT VISIT: 1 Visit                     Owens Hara P., PTA Acute Rehabilitation Services Secure Chat Preferred 9a-5:30pm Office: [phone]    Mariel Shope [facility] 03/08/2024, 2:05 PM

## 2024-03-08 NOTE — Progress Notes (Signed)
 Psychosocial Progressive/Outcome: ANOx3/4 at times; intermittent confused  Calm and cooperative Wife at bedside    Pain/Comfort Progression/Outcome: Pt did not complain of pain during shift    Clinical Progression/Outcome: Adequate fluid and diet intake  Turned and repositioned during shift  Worked w/ PT  X2 assist w/ walker  X1 assist to turn in bed  Voids to urinal/bepan  Had multi BM in bedpan/BSC Sat up in chair  Slept in between care Maintained safety

## 2024-03-09 DIAGNOSIS — K921 Melena: Secondary | ICD-10-CM | POA: Diagnosis not present

## 2024-03-09 LAB — GLUCOSE, CAPILLARY
Glucose-Capillary: 138 mg/dL — ABNORMAL HIGH (ref 70–99)
Glucose-Capillary: 207 mg/dL — ABNORMAL HIGH (ref 70–99)

## 2024-03-09 NOTE — Progress Notes (Signed)
 1337- called and gave report to [facility], LPN. All questions were answered. Pt's belongings are packed and together, discharge paperwork completed, and wife at bedside.

## 2024-03-09 NOTE — TOC Transition Note (Signed)
 Transition of Care [facility]) - Discharge Note   Patient Details  Name: Tommy Green MRN: [id number] Date of Birth: September 20, 1946  Transition of [facility]) CM/SW Contact:  Aniah Pauli A [place], LCSW Phone Number: 03/09/2024, 12:22 PM   Clinical Narrative:     Patient will DC to: [facility]  Anticipated DC date: 03/09/24  Family notified: [name] Lorenzetti  Transport by: Lyna Sandhoff      Per MD patient ready for DC to [facility]. RN, patient, patient's family, and facility notified of DC. Discharge Summary and FL2 sent to facility. RN to call report prior to discharge [phone]). DC packet on chart. Ambulance transport requested for patient.     CSW will sign off for now as social work intervention is no longer needed. Please consult us  again if new needs arise.   Final next level of care: Skilled Nursing Facility Barriers to Discharge: Barriers Resolved   Patient Goals and CMS Choice            Discharge Placement              Patient chooses bed at: [facility] Patient to be transferred to facility by: [facility] Name of family member notified: [name] Alen Patient and family notified of of transfer: 03/09/24  Discharge Plan and Services Additional resources added to the After Visit Summary for                                       Social Drivers of Health (SDOH) Interventions SDOH Screenings   Food Insecurity: No Food Insecurity (03/02/2024)  Housing: Low Risk  (03/02/2024)  Transportation Needs: No Transportation Needs (03/02/2024)  Utilities: Not At Risk (03/02/2024)  Alcohol Screen: Low Risk  (11/04/2023)  Depression (PHQ2-9): Low Risk  (01/03/2024)  Financial Resource Strain: Low Risk  (11/04/2023)  Physical Activity: Inactive (11/04/2023)  Social Connections: Moderately Integrated (03/02/2024)  Stress: No Stress Concern Present (11/04/2023)  Tobacco Use: Medium Risk (03/05/2024)  Health Literacy: Adequate Health Literacy (11/04/2023)      Readmission Risk Interventions    01/16/2024   12:29 PM  Readmission Risk Prevention Plan  Transportation Screening Complete  PCP or Specialist Appt within 5-7 Days Complete  Home Care Screening Complete  Medication Review (RN CM) Complete

## 2024-03-09 NOTE — Progress Notes (Signed)
 Occupational Therapy Treatment Patient Details Name: Tommy Green MRN: [id number] DOB: [birth year] Today's Date: 03/09/2024   History of present illness Pt is an 78 y/o M presenting to ED on 5/16 with BLE edema and weakness, dark stools, admitted for GI bleed. S/P small bowel enteroscopy 5/19 AM showing "Noted grade 1 varices and small nonbleeding angioectasia". Of note, pt c/o L shoulder pain, 5/21 imaging showing "  1. No acute findings.  2. 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   OT comments  Patient screaming from room on [facility], begging for help. OT arriving, assisting with mobility specialist for peri-care and transfers. Patient total A for peri-care but improved movement and able to complete sit<>stand and stand pivot to recliner with min-mod A. Patient anxious and confused, benefitting from redirection. OT recommendation remains appropriate, will continue to follow.       If plan is discharge home, recommend the following:  A lot of help with walking and/or transfers;A lot of help with bathing/dressing/bathroom;Assistance with cooking/housework;Assist for transportation;Direct supervision/assist for financial management;Direct supervision/assist for medications management;Help with stairs or ramp for entrance   Equipment Recommendations  Other (comment) (defer to next venue)    Recommendations for Other Services      Precautions / Restrictions Precautions Precautions: Fall Recall of Precautions/Restrictions: Impaired Precaution/Restrictions Comments: waxing/waning cognition Restrictions Weight Bearing Restrictions Per Provider Order: No       Mobility Bed Mobility Overal bed mobility: Needs Assistance             General bed mobility comments: on BSC  upon OT entry    Transfers Overall transfer level: Needs assistance Equipment used: Rolling walker (2 wheels) Transfers: Sit to/from Stand, Bed to chair/wheelchair/BSC Sit to Stand: Min assist, Mod assist           General transfer comment: Min-mod A in order to come into standing, able to maintain balance for peri-care then transition to recliner     Balance Overall balance assessment: Needs assistance Sitting-balance support: Feet supported, No upper extremity supported Sitting balance-Leahy Scale: Fair Sitting balance - Comments: fair when sitting on BSC   Standing balance support: During functional activity, Reliant on assistive device for balance, Bilateral upper extremity supported, Single extremity supported Standing balance-Leahy Scale: Poor Standing balance comment: reliant on RW                           ADL either performed or assessed with clinical judgement   ADL Overall ADL's : Needs assistance/impaired                         Toilet Transfer: Minimal assistance;Cueing for safety;Cueing for sequencing;Stand-pivot;Regular Toilet;BSC/3in1;Moderate assistance Toilet Transfer Details (indicate cue type and reason): on BSC upon entry, transferred to recliner Toileting- Clothing Manipulation and Hygiene: Total assistance;Sitting/lateral lean;Sit to/from stand Toileting - Clothing Manipulation Details (indicate cue type and reason): unable to complete peri-care in standing     Functional mobility during ADLs: Moderate assistance;Cueing for sequencing;Cueing for safety;Rolling walker (2 wheels) General ADL Comments: Patient screaming from room on [facility], begging for help. OT arriving, assisting with mobility specialist for peri-care and transfers. Patient total A for peri-care but improved movement and able to complete sit<>stand and stand pivot to recliner with min-mod A. Patient  anxious and confused, benefitting from redirection. OT recommendation  remains appropriate, will continue to follow.    Extremity/Trunk Assessment              Occupational psychologist Communication: No apparent difficulties   Cognition Arousal: Alert Behavior During Therapy: Flat affect, Anxious, Restless Cognition: Cognition impaired   Orientation impairments: Situation, Time Awareness: Intellectual awareness impaired, Online awareness impaired Memory impairment (select all impairments): Short-term memory, Working memory Attention impairment (select first level of impairment): Focused attention Executive functioning impairment (select all impairments): Sequencing, Problem solving, Reasoning OT - Cognition Comments: Patient screaming from [facility] for help, unaware of where he was or what he was doing at the hospital                 Following commands: Impaired Following commands impaired: Only follows one step commands consistently, Follows one step commands with increased time, Follows multi-step commands inconsistently      Cueing   Cueing Techniques: Verbal cues, Gestural cues, Tactile cues  Exercises      Shoulder Instructions       General Comments      Pertinent Vitals/ Pain       Pain Assessment Pain Assessment: Faces Faces Pain Scale: Hurts even more Pain Location: generalized with movement Pain Descriptors / Indicators: Guarding, Sore Pain Intervention(s): Limited activity within patient's tolerance, Monitored during session, Repositioned  Home Living                                          Prior Functioning/Environment              Frequency  Min 2X/week        Progress Toward Goals  OT Goals(current goals can now be found in the care plan section)     Acute Rehab OT Goals Patient Stated Goal: unable OT Goal Formulation: Patient unable to participate in goal setting Time For Goal Achievement: 03/18/24 Potential to Achieve Goals: Good   Plan      Co-evaluation                 AM-PAC OT "6 Clicks" Daily Activity     Outcome Measure   Help from another person eating meals?: A Little Help from another person taking care of personal grooming?: A Little Help from another person toileting, which includes using toliet, bedpan, or urinal?: Total Help from another person bathing (including washing, rinsing, drying)?: A Lot Help from another person to put on and taking off regular upper body clothing?: A Lot Help from another person to put on and taking off regular lower body clothing?: Total 6 Click Score: 12    End of Session Equipment Utilized During Treatment: Gait belt;Rolling walker (2 wheels)  OT Visit Diagnosis: Unsteadiness on feet (R26.81);Other abnormalities of gait and mobility (R26.89);Muscle weakness (generalized) (M62.81)   Activity Tolerance Patient tolerated treatment well   Patient Left in chair;with call bell/phone within reach;with chair alarm set   Nurse Communication Mobility status        Time: 1610-9604 OT Time Calculation (min): 14 min  Charges: OT General Charges $OT Visit: 1 Visit OT Treatments $Self Care/Home Management : 8-22 mins  Mollie Anger E. Deem Marmol, OTR/L Acute Rehabilitation Services [phone]   Vincent Greek 03/09/2024, 10:12 AM

## 2024-03-09 NOTE — Discharge Summary (Signed)
 Physician Discharge Summary  Tommy Green ZOX:[id number] DOB: [birth year] DOA: 03/02/2024  PCP: Jobe Mulder, DO  Admit date: 03/02/2024 Discharge date: 03/09/2024  Time spent: 45 minutes  Recommendations for Outpatient Follow-up:  PLEASE ENSURE, Infectious disease Dr. Shereen Dike in 1 to 2 weeks for follow-up of liver abscess PCP in 1 week CBC and BMP at follow-up   Discharge Diagnoses:  Upper GI bleed Acute blood loss anemia Esophageal varices Acute on chronic diastolic CHF Hypoalbuminemia Liver abscess   AKI (acute kidney injury) (HCC)   Essential hypertension   Hyperlipidemia associated with type 2 diabetes mellitus (HCC)   History of stroke   Acute on chronic anemia   Acute heart failure with preserved ejection fraction (HFpEF) (HCC)   AVM (arteriovenous malformation) of small bowel, acquired with hemorrhage History of upper extremity DVT  Discharge Condition: Improved  Diet recommendation: Los 1, heart healthy  Filed Weights   03/02/24 2100  Weight: 108 kg    History of present illness:  78 y.o. male with history h/o HTN, HLP, GERD, colon polyps, hiatal hernia, rheumatoid arthritis, CVA, CKD stage IIIb, aortic stenosis s/p TAVR, recurrent falls with history of subdural hematoma in the past, DVT of right upper extremity on Eliquis , hepatic abscess, chronic anemia, grade I esophageal varices, p/w bilateral leg swelling, dark stools, and worsening weakness.   Hospital Course:   Acute on chronic blood loss anemia Jejunal AVM Esophageal varices -Hemoglobin was 6.4 on admission, transfuse 1 unit PRBC, now stable in the mid 7-8 range -Seen by gastroenterology this admission, push enteroscopy 5/19 noted small jejunal AVM treated with APC, esophageal varices were noted without bleeding stigmata no other bleeding sources noted -Hemoglobin has stabilized, GI felt it was okay to resume Eliquis  however patient declined resuming Eliquis  at this time, recommend further  discussions regarding risk-benefit at follow-up   Acute exacerbation of HFpEF - Noted 3+ BL LE pitting edema on presentation.  Echo last month showed preserved EF.  No previous history of lower extremity edema.  Responded  well to IV Lasix   - Net negative nearly 8 L since presentation.  Review of the edema could be multifactorial related to CHF and hypoalbuminemia and liver dysfunction.   - Transition to oral Lasix  40 Mg daily at discharge   Acute on chronic diarrhea with non-anion gap metabolic acidosis - CO2 16 on presentation.  Likely GI loss given multiple bowel movements daily.  Due to patient's antibiotic use, C. difficile ordered and subsequently negative.  Responding well to as needed loperamide . Recheck BMP and mag in AM.  Acidemia appears resolved.     AKI on CKD-3b -Cr above baseline.  Likely cardiorenal etiology.  Now plateauing in the low 2 range  Hypomagnesemia - Repleted    H/o Hepatic abscess (POA) - Noted on previous hospitalization, is on p.o. Cipro  plus Flagyl  (end date 5/22).  Previously scheduled to follow-up with infectious disease on Monday 5/19.  Continue p.o. antibiotics. - Imaging this admission only notes mild improvement in size of hepatic fluid collection, recommended to continue antibiotics for 1 more week and follow-up with the infectious disease team soon   History of upper ext DVT - Anticoagulation  held secondary to GI bleed above.  Recent history of upper ext DVT. Doppler ultrasound showed no DVT at this time. -pt adamantly declines restarting anticoagulation   History of AS s/p TAVR -noted.   Hypoglycemia - Appears resolved.    Discharge Exam: Vitals:   03/09/24 0738 03/09/24 0829  BP: [name]  136/57 (!) 136/57  Pulse: 70   Resp:    Temp: 97.7 F (36.5 C)   SpO2: 100%    Gen: Awake, Alert, Oriented X 2 HEENT: no JVD Lungs: Good air movement bilaterally, CTAB CVS: S1S2/RRR Abd: soft, Non tender, non distended, BS present Extremities: trace  edema Skin: no new rashes on exposed skin   Discharge Instructions   Discharge Instructions     Diet - low sodium heart healthy   Complete by: As directed    Diet Carb Modified   Complete by: As directed    Discharge wound care:   Complete by: As directed    routine   Increase activity slowly   Complete by: As directed       Allergies as of 03/09/2024       Reactions   Canagliflozin Other (See Comments)   Pt reports that the use of Invokana caused acute KIDNEY FAILURE ([facility] records) and "allergic," per [facility]   Aspirin  Other (See Comments)   Gastroesophageal reflux and "allergic," per [facility]   Atorvastatin Other (See Comments)   Myalgias, GI upset, and "allergic," per [facility]   Statins Other (See Comments)   Muscle pain- "allergic," per [facility]        Medication List     STOP taking these medications    amLODipine  10 MG tablet Commonly known as: NORVASC    apixaban  5 MG Tabs tablet Commonly known as: ELIQUIS    aspirin  EC 81 MG tablet   lisinopril  40 MG tablet Commonly known as: ZESTRIL        TAKE these medications    acetaminophen  325 MG tablet Commonly known as: TYLENOL  Take 650 mg by mouth every 6 (six) hours as needed for mild pain or headache.   ciprofloxacin  500 MG tablet Commonly known as: CIPRO  Take 1 tablet (500 mg total) by mouth 2 (two) times daily for 7 days.   cyanocobalamin  1000 MCG tablet Commonly known as: VITAMIN B12 Take 1,000 mcg by mouth daily.   Enbrel  Mini 50 MG/ML injection Generic drug: etanercept  Inject 1 mL (50 mg total) into the skin once a week.   escitalopram  20 MG tablet Commonly known as: Lexapro  Take 1 tablet (20 mg total) by mouth daily.   ezetimibe  10 MG tablet Commonly known as: ZETIA  Take 1 tablet (10 mg total) by mouth daily. What changed: when to take this   fenofibrate  micronized 200 MG capsule Commonly known as: LOFIBRA TAKE 1 CAPSULE DAILY BEFOREBREAKFAST   finasteride  5 MG tablet Commonly known as:  PROSCAR  Take 1 tablet (5 mg total) by mouth daily. What changed: when to take this   fluticasone  50 MCG/ACT nasal spray Commonly known as: FLONASE  Place 2 sprays into both nostrils daily. What changed: when to take this   furosemide  20 MG tablet Commonly known as: LASIX  Take 2 tablets (40 mg total) by mouth daily. What changed: how much to take   insulin  lispro 100 UNIT/ML injection Commonly known as: HUMALOG Inject 0-10 Units into the skin 2 (two) times daily with a meal. Inject 0-10 units into the skin before meals, PER SLIDING SCALE: BGL 0-200 = give nothing, 201-250 = 2 units, 251-300 = 4 units, 301-350 = 6 units, 351-400 = 8 units, >400 = 10 units, push fluids, and repeat BGL check in 2 hours. If BGL remains >400 after 2 hours, CALL MD.   leflunomide  10 MG tablet Commonly known as: ARAVA  Take 1 tablet (10 mg total) by mouth daily.   loperamide  2 MG  tablet Commonly known as: IMODIUM  A-D Take 2 mg by mouth as needed for diarrhea or loose stools.   magnesium  oxide 400 MG tablet Commonly known as: MAG-OX Take 400 mg by mouth daily with lunch.   melatonin 5 MG Tabs Take 5 mg by mouth at bedtime.   metoprolol  tartrate 25 MG tablet Commonly known as: LOPRESSOR  Take 0.5 tablets (12.5 mg total) by mouth 2 (two) times daily.   metroNIDAZOLE  500 MG tablet Commonly known as: FLAGYL  Take 1 tablet (500 mg total) by mouth 2 (two) times daily for 7 days.   multivitamin with minerals tablet Take 1 tablet by mouth at bedtime.   Omega 3 1000 MG Caps Take 2,000 mg by mouth daily with lunch.   omeprazole  20 MG capsule Commonly known as: PRILOSEC Take 1 capsule (20 mg total) by mouth daily. What changed: when to take this   potassium citrate 10 MEQ (1080 MG) SR tablet Commonly known as: UROCIT-K Take 10 mEq by mouth 2 (two) times daily.   tamsulosin  0.4 MG Caps capsule Commonly known as: FLOMAX  Take 0.8 mg by mouth at bedtime.   traZODone  100 MG tablet Commonly known as:  DESYREL  Take 100 mg by mouth at bedtime.   Tresiba  FlexTouch 100 UNIT/ML FlexTouch Pen Generic drug: insulin  degludec Inject 20 Units into the skin daily.   Vitamin D3 Super Strength 50 MCG (2000 UT) Caps Generic drug: Cholecalciferol  Take 2,000 Units by mouth daily.   vitamin E  180 MG (400 UNITS) capsule Take 400 Units by mouth daily.               Discharge Care Instructions  (From admission, onward)           Start     Ordered   03/07/24 0000  Discharge wound care:       Comments: routine   03/07/24 1022           Allergies  Allergen Reactions   Canagliflozin Other (See Comments)    Pt reports that the use of Invokana caused acute KIDNEY FAILURE ([facility] records) and "allergic," per [facility]   Aspirin  Other (See Comments)    Gastroesophageal reflux and "allergic," per [facility]    Atorvastatin Other (See Comments)    Myalgias, GI upset, and "allergic," per [facility]   Statins Other (See Comments)    Muscle pain- "allergic," per [facility]    Contact information for after-discharge care     Destination     [facility] .   Service: Skilled Nursing Contact information: [address] [place]  [phone]                      The results of significant diagnostics from this hospitalization (including imaging, microbiology, ancillary and laboratory) are listed below for reference.    Significant Diagnostic Studies: DG Shoulder Left Result Date: 03/07/2024 EXAM: 3 VIEW(S) XRAY OF THE LEFT SHOULDER 03/07/2024 03:32:00 PM COMPARISON: CT of the chest 03/02/2024 and 2-view chest x-ray 01/22/2022. CLINICAL HISTORY: L shoulder pain FINDINGS: BONES AND JOINTS: Glenohumeral joint is normally aligned. No evidence of acute fracture or dislocation. Degenerative changes are present at the acromion and greater tuberosity. SOFT TISSUES: No abnormal calcifications. Visualized lung is unremarkable. IMPRESSION: 1. No acute findings. 2.  Degenerative changes at the acromion and greater tuberosity. Electronically signed by: Audree Leas MD 03/07/2024 07:20 PM EDT RP Workstation: [id number]   VAS US  LOWER EXTREMITY VENOUS (DVT) Result Date: 03/05/2024  Lower Venous  DVT Study Patient Name:  Tommy Green  Date of Exam:   03/05/2024 Medical Rec #: [id number]          Accession #:    [id number] Date of Birth: 10-03-1946           Patient Gender: M Patient Age:   57 years Exam Location:  [facility] Procedure:      VAS US  LOWER EXTREMITY VENOUS (DVT) Referring Phys: Michela Aguas --------------------------------------------------------------------------------  Indications: Edema.  Anticoagulation: Eliquis  (prior to admission). Limitations: Body habitus and poor ultrasound/tissue interface. Comparison Study: No previous exams Performing Technologist: Jody Hill RVT, RDMS  Examination Guidelines: A complete evaluation includes B-mode imaging, spectral Doppler, color Doppler, and power Doppler as needed of all accessible portions of each vessel. Bilateral testing is considered an integral part of a complete examination. Limited examinations for reoccurring indications may be performed as noted. The reflux portion of the exam is performed with the patient in reverse Trendelenburg.  +---------+---------------+---------+-----------+----------+--------------+ RIGHT    CompressibilityPhasicitySpontaneityPropertiesThrombus Aging +---------+---------------+---------+-----------+----------+--------------+ CFV      Full           Yes      Yes                                 +---------+---------------+---------+-----------+----------+--------------+ SFJ      Full                                                        +---------+---------------+---------+-----------+----------+--------------+ FV Prox  Full           Yes      Yes                                  +---------+---------------+---------+-----------+----------+--------------+ FV Mid   Full           Yes      Yes                                 +---------+---------------+---------+-----------+----------+--------------+ FV DistalFull           Yes      Yes                                 +---------+---------------+---------+-----------+----------+--------------+ PFV      Full                                                        +---------+---------------+---------+-----------+----------+--------------+ POP      Full           Yes      Yes                                 +---------+---------------+---------+-----------+----------+--------------+ PTV      Full                                                        +---------+---------------+---------+-----------+----------+--------------+  PERO     Full                                                        +---------+---------------+---------+-----------+----------+--------------+   +---------+---------------+---------+-----------+----------+--------------+ LEFT     CompressibilityPhasicitySpontaneityPropertiesThrombus Aging +---------+---------------+---------+-----------+----------+--------------+ CFV      Full           Yes      Yes                                 +---------+---------------+---------+-----------+----------+--------------+ SFJ      Full                                                        +---------+---------------+---------+-----------+----------+--------------+ FV Prox  Full           Yes      Yes                                 +---------+---------------+---------+-----------+----------+--------------+ FV Mid   Full           Yes      Yes                                 +---------+---------------+---------+-----------+----------+--------------+ FV DistalFull           Yes      Yes                                  +---------+---------------+---------+-----------+----------+--------------+ PFV      Full                                                        +---------+---------------+---------+-----------+----------+--------------+ POP      Full           Yes      Yes                                 +---------+---------------+---------+-----------+----------+--------------+ PTV      Full                                                        +---------+---------------+---------+-----------+----------+--------------+ PERO     Full                                                        +---------+---------------+---------+-----------+----------+--------------+  Summary: BILATERAL: - No evidence of deep vein thrombosis seen in the lower extremities, bilaterally. -No evidence of popliteal cyst, bilaterally. -Subcutaneous edema, bilaterally.  *See table(s) above for measurements and observations. Electronically signed by Runell Countryman on 03/05/2024 at 3:45:14 PM.    Final    VAS US  UPPER EXTREMITY VENOUS DUPLEX Result Date: 03/05/2024 UPPER VENOUS STUDY  Patient Name:  Tommy Green  Date of Exam:   03/05/2024 Medical Rec #: [id number]          Accession #:    [id number] Date of Birth: 09/27/1946           Patient Gender: M Patient Age:   46 years Exam Location:  [facility] Procedure:      VAS US  UPPER EXTREMITY VENOUS DUPLEX Referring Phys: Michela Aguas --------------------------------------------------------------------------------  Indications: Follow up exam Anticoagulation: Eliquis  (prior to admission). Comparison Study: Previous exam on 01/19/2024 SVT of basilic and cephalic veins,                   DVT of radial vein Performing Technologist: Jody Hill RVT, RDMS  Examination Guidelines: A complete evaluation includes B-mode imaging, spectral Doppler, color Doppler, and power Doppler as needed of all accessible portions of each vessel. Bilateral testing is considered an integral part  of a complete examination. Limited examinations for reoccurring indications may be performed as noted.  Right Findings: +----------+------------+---------+-----------+----------+-------+ RIGHT     CompressiblePhasicitySpontaneousPropertiesSummary +----------+------------+---------+-----------+----------+-------+ IJV           Full       Yes       Yes                      +----------+------------+---------+-----------+----------+-------+ Subclavian    Full       Yes       Yes                      +----------+------------+---------+-----------+----------+-------+ Axillary      Full       Yes       Yes                      +----------+------------+---------+-----------+----------+-------+ Brachial      Full       Yes       Yes                      +----------+------------+---------+-----------+----------+-------+ Radial        Full                                          +----------+------------+---------+-----------+----------+-------+ Ulnar         Full                                          +----------+------------+---------+-----------+----------+-------+ Cephalic      Full                                          +----------+------------+---------+-----------+----------+-------+ Basilic       Full       Yes       Yes                      +----------+------------+---------+-----------+----------+-------+  Left Findings: +----------+------------+---------+-----------+----------+-------+ LEFT      CompressiblePhasicitySpontaneousPropertiesSummary +----------+------------+---------+-----------+----------+-------+ Subclavian    Full       Yes       Yes                      +----------+------------+---------+-----------+----------+-------+  Summary:  Right: No evidence of deep vein thrombosis in the upper extremity. No evidence of superficial vein thrombosis in the upper extremity.  Left: No evidence of thrombosis in the subclavian.  *See  table(s) above for measurements and observations.  Diagnosing physician: Runell Countryman Electronically signed by Runell Countryman on 03/05/2024 at 3:44:58 PM.    Final    CT ABDOMEN PELVIS WO CONTRAST Result Date: 03/03/2024 CLINICAL DATA:  Blunt trauma yesterday, fell, with anemia. Looking for retroperitoneal bleed. EXAM: CT ABDOMEN AND PELVIS WITHOUT CONTRAST TECHNIQUE: Multidetector CT imaging of the abdomen and pelvis was performed following the standard protocol without IV contrast. RADIATION DOSE REDUCTION: This exam was performed according to the departmental dose-optimization program which includes automated exposure control, adjustment of the mA and/or kV according to patient size and/or use of iterative reconstruction technique. COMPARISON:  CT chest, abdomen and pelvis without contrast yesterday at 6:28 p.m., CT abdomen and pelvis without contrast 02/14/2024. FINDINGS: Lower chest: There are minimal bilateral layering pleural effusions new since yesterday's exam. There is mild interstitial edema in both lung bases also new, possibly due to fluid overload congestive. There is increased bilateral lower lobe bronchial thickening which could be bronchitic or congestive. No focal infiltrate is seen. The heart is slightly enlarged. The cardiac blood pool is less dense than the myocardium consistent with anemia. There is a TAVR, and calcification in the coronary arteries and mitral ring. Small hiatal hernia. Hepatobiliary: There is a 2.2 cm hypodense lesion in hepatic segment 5, evaluated on MRI 01/16/2024 and thought to be infectious, on MRI was about double the size, unchanged since yesterday, was 2.5 cm on 02/14/2024. There is a stable 1 cm nonspecific hypodensity in segment 8 on 3:20, indeterminate. Rest of the unenhanced liver is homogeneous. The gallbladder is absent without biliary dilatation. Pancreas: Unremarkable without contrast. Spleen: Unremarkable without contrast. Adrenals/Urinary Tract: No adrenal  mass. 2.3 cm Bosniak 1 cyst superior pole left kidney requires no follow-up imaging. No other contour deforming abnormality of the unenhanced kidneys is seen. There is no urinary stone or obstruction. No bladder thickening. Stomach/Bowel: Negative for dilatation or wall thickening. Uncomplicated sigmoid diverticulosis. Normal appendix. Vascular/Lymphatic: Aortic atherosclerosis. No enlarged abdominal or pelvic lymph nodes. Reproductive: Prostate is unremarkable. Other: Bilateral fat hernias. No incarcerated hernia. Trace presacral ascites. There is no free hemorrhage. Body wall edema again seen in the flanks and thighs. Musculoskeletal: Bilateral rod and pedicle screw fusion construct T10-S1 is again noted with bilateral sacroiliac joint bolts. 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 Osteopenia. IMPRESSION: 1. No evidence of retroperitoneal bleed. 2. Minimal bilateral pleural effusions and mild interstitial edema in the lung bases, new since yesterday's exam, possibly due to fluid overload or congestive. 3. Increased bilateral lower lobe bronchial thickening which could be bronchitic or congestive. 4. Mild cardiomegaly with TAVR. Aortic and coronary artery atherosclerosis. 5. 2.2 cm hypodense lesion in hepatic segment 5, evaluated on MRI 01/16/2024 and thought to be infectious, unchanged since yesterday, was 2.5 cm on 02/14/2024. 6. 1 cm nonspecific hypodensity in segment 8 of the liver, unchanged. 7. Trace presacral ascites. 8. Body wall edema in the flanks and thighs. 9. Diverticulosis without evidence of  diverticulitis. 10. Osteopenia, postsurgical and degenerative change. Aortic Atherosclerosis (ICD10-I70.0).  Or Electronically Signed   By: Denman Fischer M.D.   On: 03/03/2024 07:03   CT CHEST ABDOMEN PELVIS WO CONTRAST Result Date: 03/02/2024 CLINICAL DATA:  Blunt trauma EXAM: CT CHEST, ABDOMEN AND PELVIS WITHOUT CONTRAST TECHNIQUE: Multidetector CT imaging of the chest, abdomen and  pelvis was performed following the standard protocol without IV contrast. RADIATION DOSE REDUCTION: This exam was performed according to the departmental dose-optimization program which includes automated exposure control, adjustment of the mA and/or kV according to patient size and/or use of iterative reconstruction technique. COMPARISON:  CT 02/14/2024, 01/15/2024, MRI 01/16/2024 FINDINGS: CT CHEST FINDINGS Cardiovascular: Limited evaluation without intravenous contrast.aortic valve prosthesis. Dense mitral calcification. Coronary vascular calcification. Normal cardiac size. No pericardial effusion Mediastinum/Nodes: Patent trachea. No thyroid mass. No suspicious lymph nodes. Esophagus within normal limits. Lungs/Pleura: Lungs are clear. No pleural effusion or pneumothorax. Subpleural left lower lobe pulmonary nodule measures 6 mm on series 4, image 79, previously 7 mm. Mild dependent atelectasis Musculoskeletal: Sternum appears intact. No acute osseous abnormality. CT ABDOMEN PELVIS FINDINGS Hepatobiliary: Cholecystectomy. No biliary dilatation. Further decrease in size of a inferior right hepatic lobe hypodensity, now measuring 11 mm maximum on series 3, image 68, previously 2.5 cm. Pancreas: Unremarkable. No pancreatic ductal dilatation or surrounding inflammatory changes. Spleen: Normal in size without focal abnormality. Adrenals/Urinary Tract: Adrenal glands are normal. Slight prominence of right renal pelvis and ureter but no obstructing stone. The bladder is normal Stomach/Bowel: Stomach nonenlarged. No dilated small bowel. Diffuse fluid in the colon. Negative appendix. No acute bowel wall thickening. Mild diverticular disease of the left colon Vascular/Lymphatic: Aortic atherosclerosis. No enlarged abdominal or pelvic lymph nodes. Reproductive: Prostate is unremarkable. Other: Negative for pelvic effusion or free air. Subcutaneous edema at the bilateral hips. Musculoskeletal: Posterior spinal fusion  hardware extends from T10 to the upper sacrum. There are bilateral sacroiliac screws. No acute osseous abnormality. IMPRESSION: 1. Negative for acute intrathoracic, intra-abdominal, or intrapelvic abnormality. 2. Diffuse fluid in the colon suggesting diarrheal illness. Mild diverticular disease of the left colon without acute inflammatory process. 3. Further decrease in size of inferior right hepatic lobe hypodensity, now measuring 11 mm, previously 2.5 cm. 4. 6 mm left lower lobe pulmonary nodule, previously 7 mm. This is grossly stable as compared with November 2024 limited lung bases on abdominal CT. Future CT at 18-24 months (from 08/21/2023 scan) is considered optional for low-risk patients, but is recommended for high-risk patients. This recommendation follows the consensus statement: Guidelines for Management of Incidental Pulmonary Nodules Detected on CT Images: From the Fleischner Society 2017; Radiology 2017; 284:228-243. 5. Aortic atherosclerosis. Aortic Atherosclerosis (ICD10-I70.0). Electronically Signed   By: Esmeralda Hedge M.D.   On: 03/02/2024 19:02   CT HEAD WO CONTRAST ( ) Result Date: 03/02/2024 CLINICAL DATA:  Head trauma abnormal mental status EXAM: CT HEAD WITHOUT CONTRAST TECHNIQUE: Contiguous axial images were obtained from the base of the skull through the vertex without intravenous contrast. RADIATION DOSE REDUCTION: This exam was performed according to the departmental dose-optimization program which includes automated exposure control, adjustment of the mA and/or kV according to patient size and/or use of iterative reconstruction technique. COMPARISON:  CT brain 01/15/2024, 12/09/2021, MRI 03/06/2021 FINDINGS: Brain: No acute territorial infarction, hemorrhage, or intracranial mass. Chronic encephalomalacia at the inferolateral left frontal lobe. Chronic left dural thickening. Stable ventricle size. Atrophy and chronic small vessel ischemic changes of the white matter Vascular: No  hyperdense vessels.  Carotid vascular  calcification Skull: Left-sided craniotomy.  No fracture Sinuses/Orbits: No acute finding. Other: None IMPRESSION: 1. No CT evidence for acute intracranial abnormality. 2. Atrophy and chronic small vessel ischemic changes of the white matter. Chronic encephalomalacia at the inferolateral left frontal lobe. Electronically Signed   By: Esmeralda Hedge M.D.   On: 03/02/2024 18:43   CT ABDOMEN PELVIS WO CONTRAST Result Date: 02/16/2024 EXAMINATION: CT ABDOMEN PELVIS WO CONTRAST CLINICAL INDICATION: Male, 78 years old. f/u liver abscesses. TECHNIQUE: Helical CT scan examination of the abdomen and pelvis is performed from the domes of the diaphragm to the pubic symphysis. Limited evaluation of the solid organs due to lack of intravenous contrast. Unless otherwise specified, incidental thyroid, adrenal, renal lesions do not require dedicated imaging follow up. Additionally, any mentioned pulmonary nodules do not require dedicated imaging follow-up based on the Fleischner guidelines unless otherwise specified. Coronary calcifications are not identified unless otherwise specified. COMPARISON: 01/24/2024 FINDINGS: There are subsegmental atelectatic changes within the lung bases. The heart is normal in size. There are coronary calcifications. Transcatheter aortic valve replacement noted. There is a small hiatal hernia. Previously noted hypoattenuating lesion in hepatic segment 5/6 is decreased in size now measuring 2.5 cm (previously measuring 3.1 cm when measured in similar fashion on prior). The gallbladder is surgically absent. The spleen is normal. The pancreas is normal. Adrenals are normal. Left renal cyst is seen. The prostate is enlarged and the urinary bladder is distended with mild bilateral hydroureteronephrosis. The abdominal aorta is normal in caliber. Scattered atherosclerotic changes are present. There is colonic diverticulosis. The appendix is normal. Large and small bowel  loops are otherwise within normal limits. There is no free fluid or pathologic lymphadenopathy by size criteria. Thoracolumbar/sacral fusion noted. There are degenerative changes of the spine and bony pelvis. IMPRESSION: Evidence liver lesion in the inferior right lobe appears decreased in size compared to prior. Enlarged prostate with distended urinary bladder with mild bilateral hydroureteronephrosis. Other findings as above. DOSE REDUCTION: This exam was performed according to our departmental dose-optimization program which includes automated exposure control, adjustment of the mA and/or kV according to patient size and/or use of iterative reconstruction technique. Electronically signed by: [place] Engel MD 02/16/2024 10:59 AM EDT RP Workstation: [id number]    Microbiology: Recent Results (from the past 240 hours)  MRSA Next Gen by PCR, Nasal     Status: Abnormal   Collection Time: 03/03/24  2:32 AM   Specimen: Nasal Mucosa; Nasal Swab  Result Value Ref Range Status   MRSA by PCR Next Gen DETECTED (A) NOT DETECTED Final    Comment: RESULT CALLED TO, READ BACK BY AND VERIFIED WITH: W STANLEY RN 03/03/2024 @ 0500 BY AB (NOTE) The GeneXpert MRSA Assay (FDA approved for NASAL specimens only), is one component of a comprehensive MRSA colonization surveillance program. It is not intended to diagnose MRSA infection nor to guide or monitor treatment for MRSA infections. Test performance is not FDA approved in patients less than 109 years old. Performed at [facility], [address], [place], [place] [address]   C Difficile Quick Screen w PCR reflex     Status: None   Collection Time: 03/03/24  1:14 PM   Specimen: STOOL  Result Value Ref Range Status   C Diff antigen NEGATIVE NEGATIVE Final   C Diff toxin NEGATIVE NEGATIVE Final   C Diff interpretation No C. difficile detected.  Final    Comment: Performed at [facility], [address], [place],  [place] [address]      Labs: Basic Metabolic Panel: Recent Labs  Lab 03/03/24 0528 03/03/24 1429 03/05/24 0409 03/06/24 0745 03/07/24 0552 03/08/24 0624  NA 141  --  137 137 135 135  K 4.2  --  4.5 3.9 3.6 3.5  CL 115*  --  109 107 105 106  CO2 17*  --  21* 22 22 20*  GLUCOSE 64*  --  105* 129* 140* 120*  BUN 45*  --  38* 35* 39* 44*  CREATININE 2.14*  --  2.16* 2.21* 2.27* 2.34*  CALCIUM 8.2*  --  8.8* 8.5* 8.2* 8.3*  MG  --  1.5* 1.7 1.7 1.5*  --    Liver Function Tests: Recent Labs  Lab 03/02/24 1354 03/05/24 0409 03/07/24 0552  AST 24 215* 59*  ALT 15 53* 43  ALKPHOS 51 135* 117  BILITOT 0.4 1.7* 0.7  PROT 5.3* 5.6* 5.5*  ALBUMIN  2.4* 2.4* 2.4*   Recent Labs  Lab 03/02/24 1354  LIPASE 41   No results for input(s): "AMMONIA" in the last 168 hours. CBC: Recent Labs  Lab 03/02/24 1354 03/02/24 2208 03/03/24 0528 03/05/24 0409 03/05/24 1025 03/05/24 1838 03/06/24 0745 03/06/24 1047 03/08/24 0624  WBC 8.2 8.2  --  12.7*  --   --  10.3  --  9.4  NEUTROABS 5.6  --   --   --   --   --   --   --   --   HGB 6.4* 7.1*   < > 8.1* 7.8* 7.9* 7.8* 7.7* 7.8*  HCT 20.5* 21.6*   < > 24.8* 24.6* 24.5* 23.9* 23.3* 24.4*  MCV 85.8 82.4  --  82.7  --   --  82.1  --  82.4  PLT 262 240  --  256  --   --  262  --  234   < > = values in this interval not displayed.   Cardiac Enzymes: No results for input(s): "CKTOTAL", "CKMB", "CKMBINDEX", "TROPONINI" in the last 168 hours. BNP: BNP (last 3 results) Recent Labs    03/02/24 1354  BNP 102.1*    ProBNP (last 3 results) No results for input(s): "PROBNP" in the last 8760 hours.  CBG: Recent Labs  Lab 03/08/24 0826 03/08/24 1212 03/08/24 1729 03/08/24 2123 03/09/24 0741  GLUCAP 129* 261* 159* 238* 138*       Signed:  [name] MD.  [facility] 03/09/2024, 10:12 AM

## 2024-03-09 NOTE — TOC Progression Note (Signed)
 Transition of Care [facility]) - Progression Note    Patient Details  Name: Tommy Green MRN: [id number] Date of Birth: [birth year]  Transition of Care [facility]) CM/SW Contact  Daylynn Stumpp A [place], LCSW Phone Number: 03/09/2024, 11:16 AM  Clinical Narrative:  \  Pt was offered a P2P due by Friday @ 12:00 [phone] option#5 [employer] Appeal# [phone].   CSW informed provider. Decided to forego P2P and proceed with insurance making decision based on available clinical information.   CSW reached out to [facility], LTC Child psychotherapist, she said that pt is LTC but will being going back to a skilled bed, so they could not get home health because pt is not on the ALF side of the facility. Requested CSW follow up with DON Germain Kohler, spoke to nurse, she said that pt would have to out of pocket at approximately $30/session per [employer] copay if wanted to receive therapies at the facility. Bed available today if deciding to proceed with plan.  CSW followed up with pt at bedside, agreeable to pay out of pocket if decide to continue therapies at facility, and then CSW followed up with pt's spouse,  [name], left voicemail, expecting return phone call. Provider updated on information. Estimated DC today back to [facility] to LTC side.      TOC will continue to follow.         Expected Discharge Plan and Services         Expected Discharge Date: 03/07/24                                     Social Determinants of Health (SDOH) Interventions SDOH Screenings   Food Insecurity: No Food Insecurity (03/02/2024)  Housing: Low Risk  (03/02/2024)  Transportation Needs: No Transportation Needs (03/02/2024)  Utilities: Not At Risk (03/02/2024)  Alcohol Screen: Low Risk  (11/04/2023)  Depression (PHQ2-9): Low Risk  (01/03/2024)  Financial Resource Strain: Low Risk  (11/04/2023)  Physical Activity: Inactive (11/04/2023)  Social Connections: Moderately Integrated (03/02/2024)  Stress: No Stress Concern Present  (11/04/2023)  Tobacco Use: Medium Risk (03/05/2024)  Health Literacy: Adequate Health Literacy (11/04/2023)    Readmission Risk Interventions    01/16/2024   12:29 PM  Readmission Risk Prevention Plan  Transportation Screening Complete  PCP or Specialist Appt within 5-7 Days Complete  Home Care Screening Complete  Medication Review (RN CM) Complete

## 2024-03-09 NOTE — NC FL2 (Signed)
 [place]  MEDICAID FL2 LEVEL OF CARE FORM     IDENTIFICATION  Patient Name: Tommy Green Birthdate: 1946/04/22 Sex: male Admission Date (Current Location): 03/02/2024  [facility] and [place] Number:  Producer, television/film/video and Address:  [facility], [address] [place], [place] [address]      Provider Number: [id number]  Attending Physician Name and Address:  [name], MD  Relative Name and Phone Number:  Daegan, Arizmendi (Spouse)  [phone]    Current Level of Care: Hospital Recommended Level of Care: Skilled Nursing Facility Prior Approval Number:    Date Approved/Denied:   PASRR Number: [id number]  Discharge Plan: SNF    Current Diagnoses: Patient Active Problem List   Diagnosis Date Noted   AVM (arteriovenous malformation) of small bowel, acquired with hemorrhage 03/05/2024   Acute heart failure with preserved ejection fraction (HFpEF) (HCC) 03/04/2024   GIB (gastrointestinal bleeding) 03/02/2024   Liver abscess due to bacteria 01/25/2024   DVT (deep venous thrombosis) (HCC) 01/25/2024   Acute urinary retention 01/25/2024   Urinary tract infection with hematuria 01/23/2024   Bacterial liver abscess 01/19/2024   Lung nodule 01/17/2024   Hepatic abscess 01/16/2024   Rheumatoid arteritis (HCC) 01/16/2024   Heme positive stool 01/16/2024   Abnormal CT of liver 01/16/2024   Acute on chronic anemia 01/16/2024   GI bleed 01/15/2024   Depression, major, single episode, in partial remission (HCC) 10/05/2023   BPH associated with nocturia 10/05/2023   Statin myopathy 06/15/2023   IDA (iron  deficiency anemia) 07/06/2022   Current mild episode of major depressive disorder without prior episode (HCC) 05/17/2022   Other chronic pain 03/05/2022   Bilateral lower extremity edema 03/05/2022   Severe aortic stenosis 01/26/2022   S/P TAVR (transcatheter aortic valve replacement) 01/26/2022   Microcytic anemia 12/10/2021   Obesity (BMI  30-39.9) 12/10/2021   Near syncope 12/09/2021   Aortic stenosis 12/09/2021   Rheumatoid arthritis (HCC) 12/09/2021   Foot drop, left foot 10/02/2021   History of colonic polyps 10/02/2021   History of rheumatic fever 10/02/2021   Stress fracture of left foot 09/03/2021   Gabapentin  overdose, accidental or unintentional, initial encounter 03/08/2021   TIA (transient ischemic attack) 03/05/2021   Body mass index (BMI) 36.0-36.9, adult 10/03/2020   Subdural hemorrhage following injury without open intracranial wound and with prolonged loss of consciousness (more than 24 hours) without return to pre-existing conscious level (HCC) 10/03/2020   AKI (acute kidney injury) (HCC)    Acute encephalopathy    SDH (subdural hematoma) (HCC) 10/24/2019   Cyclic vomiting syndrome 08/06/2019   Neuropathy 12/25/2018   AK (actinic keratosis) 08/24/2018   Neoplasm of uncertain behavior 07/27/2018   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   Tendinopathy of right gluteus medius 07/13/2018   Tendinopathy of left gluteus medius 07/13/2018   Squamous cell carcinoma in situ (SCCIS) of skin 03/24/2018   Vitamin D  deficiency 11/29/2017   Stage 3 chronic kidney disease (HCC) 10/28/2017   Primary osteoarthritis of right hip 09/11/2017   Essential hypertension 07/18/2017   Hyperlipidemia associated with type 2 diabetes mellitus (HCC) 07/18/2017   Type 2 diabetes mellitus with diabetic neuropathy, unspecified (HCC) 07/18/2017   Heart murmur 07/18/2017   Hypertriglyceridemia 07/18/2017   History of stroke 07/18/2017   DDD (degenerative disc disease), lumbar 06/15/2017   Iron  deficiency anemia 06/15/2017   Stroke (HCC) 06/15/2017   Encounter for hepatitis C screening test for low risk patient 06/30/2016   Anemia 06/19/2016   Microalbuminuria due  to type 2 diabetes mellitus (HCC) 03/19/2016   Hypertrophy of inferior nasal turbinate 12/20/2015   Disease of nasal cavity and sinuses 12/10/2015   Degenerative tear of  acetabular labrum of left hip 11/04/2015   History of tobacco abuse 05/15/2015   Morbid (severe) obesity due to excess calories (HCC) 04/11/2015   Disorder of both eustachian tubes 01/27/2015   Maxillary sinusitis 01/13/2015   Lipoprotein deficiency 01/31/2014   Gastro-esophageal reflux disease without esophagitis 11/02/2013   Rheumatic fever 11/02/2013   Erectile dysfunction associated with type 2 diabetes mellitus (HCC) 07/31/2013   Type 2 diabetes mellitus with hyperglycemia, without long-term current use of insulin  (HCC) 11/16/2012    Orientation RESPIRATION BLADDER Height & Weight     Self, Place, Time, Situation  Normal Continent Weight: 238 lb 3.2 oz (108 kg) Height:  5\' 11"  (180.3 cm)  BEHAVIORAL SYMPTOMS/MOOD NEUROLOGICAL BOWEL NUTRITION STATUS      Continent Diet (see DC summary)  AMBULATORY STATUS COMMUNICATION OF NEEDS Skin   Extensive Assist Verbally Other (Comment) (Wound / Incision (Open or Dehisced) 03/02/24 Non-pressure wound Toe (Comment which one) Anterior;Left. Wound / Incision (Open or Dehisced) 03/03/24 Buttocks Left)                       Personal Care Assistance Level of Assistance  Bathing, Feeding, Dressing Bathing Assistance: Maximum assistance Feeding assistance: Limited assistance Dressing Assistance: Maximum assistance     Functional Limitations Info  Sight, Hearing, Speech Sight Info: Adequate Hearing Info: Adequate Speech Info: Adequate    SPECIAL CARE FACTORS FREQUENCY  PT (By licensed PT), OT (By licensed OT)     PT Frequency: 3-5x/week OT Frequency: 3-5x/week            Contractures Contractures Info: Not present    Additional Factors Info  Code Status, Allergies Code Status Info: DNR Allergies Info: Canagliflozin  Aspirin   Atorvastatin  Statins           Current Medications (03/09/2024):  This is the current hospital active medication list Current Facility-Administered Medications  Medication Dose Route Frequency  Provider Last Rate Last Admin   acetaminophen  (TYLENOL ) tablet 650 mg  650 mg Oral Q6H PRN Michela Aguas, MD   650 mg at 03/04/24 1601   albuterol  (PROVENTIL ) (2.5 MG/3ML) 0.083% nebulizer solution 2.5 mg  2.5 mg Nebulization Q2H PRN Michela Aguas, MD       aspirin  EC tablet 81 mg  81 mg Oral Daily Michela Aguas, MD   81 mg at 03/09/24 0827   B-complex with vitamin C tablet 1 tablet  1 tablet Oral Daily Michela Aguas, MD   1 tablet at 03/09/24 1610   cholecalciferol  (VITAMIN D3) 25 MCG (1000 UNIT) tablet 2,000 Units  2,000 Units Oral Daily Michela Aguas, MD   2,000 Units at 03/09/24 0827   ciprofloxacin  (CIPRO ) tablet 500 mg  500 mg Oral BID Michela Aguas, MD   500 mg at 03/09/24 9604   cyanocobalamin  (VITAMIN B12) tablet 1,000 mcg  1,000 mcg Oral Daily Michela Aguas, MD   1,000 mcg at 03/09/24 0827   dextrose  50 % solution 12.5 g  12.5 g Intravenous Q1H PRN Pandora Bogaert W, DO       dextrose  50 % solution 50 mL  1 ampule Intravenous Once Calkins, Derek W, DO       escitalopram  (LEXAPRO ) tablet 20 mg  20 mg Oral Daily Michela Aguas, MD   20 mg at 03/09/24 0827   ezetimibe  (ZETIA ) tablet  10 mg  10 mg Oral Daily Michela Aguas, MD   10 mg at 03/09/24 0827   fenofibrate  tablet 160 mg  160 mg Oral Daily Michela Aguas, MD   160 mg at 03/09/24 4098   finasteride  (PROSCAR ) tablet 5 mg  5 mg Oral Daily Michela Aguas, MD   5 mg at 03/09/24 0827   furosemide  (LASIX ) injection 40 mg  40 mg Intravenous BID Jodeane Mulligan, DO   40 mg at 03/09/24 1191   insulin  aspart (novoLOG ) injection 0-5 Units  0-5 Units Subcutaneous QHS Michela Aguas, MD   2 Units at 03/08/24 2129   insulin  aspart (novoLOG ) injection 0-9 Units  0-9 Units Subcutaneous TID WC Michela Aguas, MD   1 Units at 03/09/24 0827   leflunomide  (ARAVA ) tablet 10 mg  10 mg Oral Daily Michela Aguas, MD   10 mg at 03/09/24 4782   leptospermum manuka honey (MEDIHONEY) paste 1 Application  1  Application Topical Daily Jodeane Mulligan, DO   1 Application at 03/09/24 9562   liver oil-zinc  oxide (DESITIN) 40 % ointment   Topical BID Jodeane Mulligan, DO   Given at 03/09/24 1308   loperamide  (IMODIUM ) capsule 2 mg  2 mg Oral PRN Jodeane Mulligan, DO   2 mg at 03/03/24 1605   magnesium  oxide (MAG-OX) tablet 400 mg  400 mg Oral Q lunch Michela Aguas, MD   400 mg at 03/08/24 1246   melatonin tablet 5 mg  5 mg Oral QHS Michela Aguas, MD   5 mg at 03/08/24 2128   metoprolol  tartrate (LOPRESSOR ) tablet 12.5 mg  12.5 mg Oral BID Michela Aguas, MD   12.5 mg at 03/09/24 6578   metroNIDAZOLE  (FLAGYL ) tablet 500 mg  500 mg Oral Q8H Michela Aguas, MD   500 mg at 03/09/24 4696   nystatin  (MYCOSTATIN /NYSTOP ) topical powder   Topical BID Jodeane Mulligan, DO   Given at 03/09/24 2952   omega-3 acid ethyl esters (LOVAZA ) capsule 2,000 mg  2,000 mg Oral Q lunch Michela Aguas, MD   2,000 mg at 03/08/24 1246   ondansetron  (ZOFRAN ) tablet 4 mg  4 mg Oral Q6H PRN Michela Aguas, MD       Or   ondansetron  (ZOFRAN ) injection 4 mg  4 mg Intravenous Q6H PRN Michela Aguas, MD       pantoprazole  (PROTONIX ) EC tablet 40 mg  40 mg Oral Daily Pandora Bogaert W, DO   40 mg at 03/09/24 8413   traZODone  (DESYREL ) tablet 25 mg  25 mg Oral QPM Michela Aguas, MD   25 mg at 03/08/24 1913   vitamin E  capsule 400 Units  400 Units Oral Daily Michela Aguas, MD   400 Units at 03/09/24 2440     Discharge Medications: Please see discharge summary for a list of discharge medications.  Relevant Imaging Results:  Relevant Lab Results:   Additional Information SSN: [id number]  Sheena Simonis A [place], LCSW

## 2024-03-09 NOTE — Plan of Care (Signed)

## 2024-03-11 DIAGNOSIS — R911 Solitary pulmonary nodule: Secondary | ICD-10-CM | POA: Diagnosis not present

## 2024-03-11 DIAGNOSIS — I509 Heart failure, unspecified: Secondary | ICD-10-CM | POA: Diagnosis not present

## 2024-03-11 DIAGNOSIS — R600 Localized edema: Secondary | ICD-10-CM | POA: Diagnosis not present

## 2024-03-11 DIAGNOSIS — N183 Chronic kidney disease, stage 3 unspecified: Secondary | ICD-10-CM | POA: Diagnosis not present

## 2024-03-11 DIAGNOSIS — K750 Abscess of liver: Secondary | ICD-10-CM | POA: Diagnosis not present

## 2024-03-11 DIAGNOSIS — I110 Hypertensive heart disease with heart failure: Secondary | ICD-10-CM | POA: Diagnosis not present

## 2024-03-11 DIAGNOSIS — E1122 Type 2 diabetes mellitus with diabetic chronic kidney disease: Secondary | ICD-10-CM | POA: Diagnosis not present

## 2024-03-11 DIAGNOSIS — E785 Hyperlipidemia, unspecified: Secondary | ICD-10-CM | POA: Diagnosis not present

## 2024-03-11 DIAGNOSIS — Q2730 Arteriovenous malformation, site unspecified: Secondary | ICD-10-CM | POA: Diagnosis not present

## 2024-03-11 DIAGNOSIS — D500 Iron deficiency anemia secondary to blood loss (chronic): Secondary | ICD-10-CM | POA: Diagnosis not present

## 2024-03-11 DIAGNOSIS — M069 Rheumatoid arthritis, unspecified: Secondary | ICD-10-CM | POA: Diagnosis not present

## 2024-03-11 DIAGNOSIS — D6859 Other primary thrombophilia: Secondary | ICD-10-CM | POA: Diagnosis not present

## 2024-03-13 DIAGNOSIS — K750 Abscess of liver: Secondary | ICD-10-CM | POA: Diagnosis not present

## 2024-03-13 DIAGNOSIS — R41841 Cognitive communication deficit: Secondary | ICD-10-CM | POA: Diagnosis not present

## 2024-03-13 DIAGNOSIS — M6281 Muscle weakness (generalized): Secondary | ICD-10-CM | POA: Diagnosis not present

## 2024-03-13 DIAGNOSIS — R2689 Other abnormalities of gait and mobility: Secondary | ICD-10-CM | POA: Diagnosis not present

## 2024-03-13 DIAGNOSIS — F0390 Unspecified dementia without behavioral disturbance: Secondary | ICD-10-CM | POA: Diagnosis not present

## 2024-03-13 DIAGNOSIS — K922 Gastrointestinal hemorrhage, unspecified: Secondary | ICD-10-CM | POA: Diagnosis not present

## 2024-03-13 DIAGNOSIS — R2681 Unsteadiness on feet: Secondary | ICD-10-CM | POA: Diagnosis not present

## 2024-03-13 DIAGNOSIS — R296 Repeated falls: Secondary | ICD-10-CM | POA: Diagnosis not present

## 2024-03-14 ENCOUNTER — Ambulatory Visit: Payer: Self-pay | Admitting: Gastroenterology

## 2024-03-14 DIAGNOSIS — R600 Localized edema: Secondary | ICD-10-CM | POA: Diagnosis not present

## 2024-03-14 DIAGNOSIS — K750 Abscess of liver: Secondary | ICD-10-CM | POA: Diagnosis not present

## 2024-03-14 DIAGNOSIS — R2681 Unsteadiness on feet: Secondary | ICD-10-CM | POA: Diagnosis not present

## 2024-03-14 DIAGNOSIS — K922 Gastrointestinal hemorrhage, unspecified: Secondary | ICD-10-CM | POA: Diagnosis not present

## 2024-03-14 DIAGNOSIS — F0390 Unspecified dementia without behavioral disturbance: Secondary | ICD-10-CM | POA: Diagnosis not present

## 2024-03-14 DIAGNOSIS — I509 Heart failure, unspecified: Secondary | ICD-10-CM | POA: Diagnosis not present

## 2024-03-14 DIAGNOSIS — N183 Chronic kidney disease, stage 3 unspecified: Secondary | ICD-10-CM | POA: Diagnosis not present

## 2024-03-14 DIAGNOSIS — M6281 Muscle weakness (generalized): Secondary | ICD-10-CM | POA: Diagnosis not present

## 2024-03-14 DIAGNOSIS — D500 Iron deficiency anemia secondary to blood loss (chronic): Secondary | ICD-10-CM | POA: Diagnosis not present

## 2024-03-14 DIAGNOSIS — R410 Disorientation, unspecified: Secondary | ICD-10-CM | POA: Diagnosis not present

## 2024-03-14 DIAGNOSIS — F0394 Unspecified dementia, unspecified severity, with anxiety: Secondary | ICD-10-CM | POA: Diagnosis not present

## 2024-03-14 DIAGNOSIS — F03911 Unspecified dementia, unspecified severity, with agitation: Secondary | ICD-10-CM | POA: Diagnosis not present

## 2024-03-14 DIAGNOSIS — R41841 Cognitive communication deficit: Secondary | ICD-10-CM | POA: Diagnosis not present

## 2024-03-14 DIAGNOSIS — R2689 Other abnormalities of gait and mobility: Secondary | ICD-10-CM | POA: Diagnosis not present

## 2024-03-14 DIAGNOSIS — R296 Repeated falls: Secondary | ICD-10-CM | POA: Diagnosis not present

## 2024-03-14 NOTE — Progress Notes (Signed)
 Mr. Slaght,  The biopsies of the yellowish lesion in your small intestine did not show evidence of a lipoma.  The biopsies showed non-specific changes called hyalinization.  The significance of this is unclear, but likely does not represent anything serious.   At most, I would suggest you make a follow up visit with us  to discuss whether it's worth considering any additional testing or follow up.  Team,  Please make a routine office follow up visit for Tommy Green.

## 2024-03-15 DIAGNOSIS — K750 Abscess of liver: Secondary | ICD-10-CM | POA: Diagnosis not present

## 2024-03-15 DIAGNOSIS — R2681 Unsteadiness on feet: Secondary | ICD-10-CM | POA: Diagnosis not present

## 2024-03-15 DIAGNOSIS — F0390 Unspecified dementia without behavioral disturbance: Secondary | ICD-10-CM | POA: Diagnosis not present

## 2024-03-15 DIAGNOSIS — R2689 Other abnormalities of gait and mobility: Secondary | ICD-10-CM | POA: Diagnosis not present

## 2024-03-15 DIAGNOSIS — M6281 Muscle weakness (generalized): Secondary | ICD-10-CM | POA: Diagnosis not present

## 2024-03-15 DIAGNOSIS — R41841 Cognitive communication deficit: Secondary | ICD-10-CM | POA: Diagnosis not present

## 2024-03-15 DIAGNOSIS — K922 Gastrointestinal hemorrhage, unspecified: Secondary | ICD-10-CM | POA: Diagnosis not present

## 2024-03-15 DIAGNOSIS — R296 Repeated falls: Secondary | ICD-10-CM | POA: Diagnosis not present

## 2024-03-16 DIAGNOSIS — R296 Repeated falls: Secondary | ICD-10-CM | POA: Diagnosis not present

## 2024-03-16 DIAGNOSIS — F0390 Unspecified dementia without behavioral disturbance: Secondary | ICD-10-CM | POA: Diagnosis not present

## 2024-03-16 DIAGNOSIS — K750 Abscess of liver: Secondary | ICD-10-CM | POA: Diagnosis not present

## 2024-03-16 DIAGNOSIS — R2689 Other abnormalities of gait and mobility: Secondary | ICD-10-CM | POA: Diagnosis not present

## 2024-03-16 DIAGNOSIS — M6281 Muscle weakness (generalized): Secondary | ICD-10-CM | POA: Diagnosis not present

## 2024-03-16 DIAGNOSIS — R2681 Unsteadiness on feet: Secondary | ICD-10-CM | POA: Diagnosis not present

## 2024-03-16 DIAGNOSIS — K922 Gastrointestinal hemorrhage, unspecified: Secondary | ICD-10-CM | POA: Diagnosis not present

## 2024-03-16 DIAGNOSIS — R41841 Cognitive communication deficit: Secondary | ICD-10-CM | POA: Diagnosis not present

## 2024-03-19 DIAGNOSIS — R2681 Unsteadiness on feet: Secondary | ICD-10-CM | POA: Diagnosis not present

## 2024-03-19 DIAGNOSIS — K750 Abscess of liver: Secondary | ICD-10-CM | POA: Diagnosis not present

## 2024-03-19 DIAGNOSIS — F03911 Unspecified dementia, unspecified severity, with agitation: Secondary | ICD-10-CM | POA: Diagnosis not present

## 2024-03-19 DIAGNOSIS — M6281 Muscle weakness (generalized): Secondary | ICD-10-CM | POA: Diagnosis not present

## 2024-03-19 DIAGNOSIS — R2689 Other abnormalities of gait and mobility: Secondary | ICD-10-CM | POA: Diagnosis not present

## 2024-03-20 DIAGNOSIS — M6281 Muscle weakness (generalized): Secondary | ICD-10-CM | POA: Diagnosis not present

## 2024-03-20 DIAGNOSIS — R2681 Unsteadiness on feet: Secondary | ICD-10-CM | POA: Diagnosis not present

## 2024-03-20 DIAGNOSIS — K750 Abscess of liver: Secondary | ICD-10-CM | POA: Diagnosis not present

## 2024-03-20 DIAGNOSIS — R2689 Other abnormalities of gait and mobility: Secondary | ICD-10-CM | POA: Diagnosis not present

## 2024-03-21 DIAGNOSIS — R2689 Other abnormalities of gait and mobility: Secondary | ICD-10-CM | POA: Diagnosis not present

## 2024-03-21 DIAGNOSIS — R2681 Unsteadiness on feet: Secondary | ICD-10-CM | POA: Diagnosis not present

## 2024-03-21 DIAGNOSIS — M6281 Muscle weakness (generalized): Secondary | ICD-10-CM | POA: Diagnosis not present

## 2024-03-21 DIAGNOSIS — F5104 Psychophysiologic insomnia: Secondary | ICD-10-CM | POA: Diagnosis not present

## 2024-03-21 DIAGNOSIS — K750 Abscess of liver: Secondary | ICD-10-CM | POA: Diagnosis not present

## 2024-03-21 DIAGNOSIS — F32A Depression, unspecified: Secondary | ICD-10-CM | POA: Diagnosis not present

## 2024-03-21 DIAGNOSIS — F0390 Unspecified dementia without behavioral disturbance: Secondary | ICD-10-CM | POA: Diagnosis not present

## 2024-03-22 ENCOUNTER — Telehealth: Payer: Self-pay

## 2024-03-22 ENCOUNTER — Other Ambulatory Visit: Payer: Self-pay

## 2024-03-22 ENCOUNTER — Ambulatory Visit: Admitting: Infectious Diseases

## 2024-03-22 ENCOUNTER — Encounter: Payer: Self-pay | Admitting: Infectious Diseases

## 2024-03-22 VITALS — BP 120/70 | HR 84 | Resp 16 | Ht 71.0 in | Wt 238.0 lb

## 2024-03-22 DIAGNOSIS — K750 Abscess of liver: Secondary | ICD-10-CM | POA: Diagnosis not present

## 2024-03-22 DIAGNOSIS — M0579 Rheumatoid arthritis with rheumatoid factor of multiple sites without organ or systems involvement: Secondary | ICD-10-CM | POA: Diagnosis not present

## 2024-03-22 DIAGNOSIS — Z79899 Other long term (current) drug therapy: Secondary | ICD-10-CM | POA: Diagnosis not present

## 2024-03-22 NOTE — Progress Notes (Addendum)
 Patient Active Problem List   Diagnosis Date Noted   Liver abscess 03/22/2024   Medication management 03/22/2024   AVM (arteriovenous malformation) of small bowel, acquired with hemorrhage 03/05/2024   Acute heart failure with preserved ejection fraction (HFpEF) (HCC) 03/04/2024   GIB (gastrointestinal bleeding) 03/02/2024   Liver abscess due to bacteria 01/25/2024   DVT (deep venous thrombosis) (HCC) 01/25/2024   Acute urinary retention 01/25/2024   Urinary tract infection with hematuria 01/23/2024   Bacterial liver abscess 01/19/2024   Lung nodule 01/17/2024   Hepatic abscess 01/16/2024   Rheumatoid arteritis (HCC) 01/16/2024   Heme positive stool 01/16/2024   Abnormal CT of liver 01/16/2024   Acute on chronic anemia 01/16/2024   GI bleed 01/15/2024   Depression, major, single episode, in partial remission (HCC) 10/05/2023   BPH associated with nocturia 10/05/2023   Statin myopathy 06/15/2023   IDA (iron  deficiency anemia) 07/06/2022   Current mild episode of major depressive disorder without prior episode (HCC) 05/17/2022   Other chronic pain 03/05/2022   Bilateral lower extremity edema 03/05/2022   Severe aortic stenosis 01/26/2022   S/P TAVR (transcatheter aortic valve replacement) 01/26/2022   Microcytic anemia 12/10/2021   Obesity (BMI 30-39.9) 12/10/2021   Near syncope 12/09/2021   Aortic stenosis 12/09/2021   Rheumatoid arthritis (HCC) 12/09/2021   Foot drop, left foot 10/02/2021   History of colonic polyps 10/02/2021   History of rheumatic fever 10/02/2021   Stress fracture of left foot 09/03/2021   Gabapentin  overdose, accidental or unintentional, initial encounter 03/08/2021   TIA (transient ischemic attack) 03/05/2021   Body mass index (BMI) 36.0-36.9, adult 10/03/2020   Subdural hemorrhage following injury without open intracranial wound and with prolonged loss of consciousness (more than 24 hours) without return to pre-existing conscious level (HCC)  10/03/2020   AKI (acute kidney injury) (HCC)    Acute encephalopathy    SDH (subdural hematoma) (HCC) 10/24/2019   Cyclic vomiting syndrome 08/06/2019   Neuropathy 12/25/2018   AK (actinic keratosis) 08/24/2018   Neoplasm of uncertain behavior 07/27/2018   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   Tendinopathy of right gluteus medius 07/13/2018   Tendinopathy of left gluteus medius 07/13/2018   Squamous cell carcinoma in situ (SCCIS) of skin 03/24/2018   Vitamin D  deficiency 11/29/2017   Stage 3 chronic kidney disease (HCC) 10/28/2017   Primary osteoarthritis of right hip 09/11/2017   Essential hypertension 07/18/2017   Hyperlipidemia associated with type 2 diabetes mellitus (HCC) 07/18/2017   Type 2 diabetes mellitus with diabetic neuropathy, unspecified (HCC) 07/18/2017   Heart murmur 07/18/2017   Hypertriglyceridemia 07/18/2017   History of stroke 07/18/2017   DDD (degenerative disc disease), lumbar 06/15/2017   Iron  deficiency anemia 06/15/2017   Stroke (HCC) 06/15/2017   Encounter for hepatitis C screening test for low risk patient 06/30/2016   Anemia 06/19/2016   Microalbuminuria due to type 2 diabetes mellitus (HCC) 03/19/2016   Hypertrophy of inferior nasal turbinate 12/20/2015   Disease of nasal cavity and sinuses 12/10/2015   Degenerative tear of acetabular labrum of left hip 11/04/2015   History of tobacco abuse 05/15/2015   Morbid (severe) obesity due to excess calories (HCC) 04/11/2015   Disorder of both eustachian tubes 01/27/2015   Maxillary sinusitis 01/13/2015   Lipoprotein deficiency 01/31/2014   Gastro-esophageal reflux disease without esophagitis 11/02/2013   Rheumatic fever 11/02/2013   Erectile dysfunction associated with type 2 diabetes mellitus (HCC) 07/31/2013   Type 2 diabetes mellitus with hyperglycemia, without long-term current  use of insulin  (HCC) 11/16/2012    Patient's Medications  New Prescriptions   No medications on file  Previous Medications    ACETAMINOPHEN  (TYLENOL ) 325 MG TABLET    Take 650 mg by mouth every 6 (six) hours as needed for mild pain or headache.   CHOLECALCIFEROL  (VITAMIN D3 SUPER STRENGTH) 50 MCG (2000 UT) CAPS    Take 2,000 Units by mouth daily.   ESCITALOPRAM  (LEXAPRO ) 20 MG TABLET    Take 1 tablet (20 mg total) by mouth daily.   ETANERCEPT  (ENBREL  MINI) 50 MG/ML INJECTION    Inject 1 mL (50 mg total) into the skin once a week.   EZETIMIBE  (ZETIA ) 10 MG TABLET    Take 1 tablet (10 mg total) by mouth daily.   FENOFIBRATE  MICRONIZED (LOFIBRA) 200 MG CAPSULE    TAKE 1 CAPSULE DAILY BEFOREBREAKFAST   FINASTERIDE  (PROSCAR ) 5 MG TABLET    Take 1 tablet (5 mg total) by mouth daily.   FLUTICASONE  (FLONASE ) 50 MCG/ACT NASAL SPRAY    Place 2 sprays into both nostrils daily.   FUROSEMIDE  (LASIX ) 20 MG TABLET    Take 2 tablets (40 mg total) by mouth daily.   INSULIN  DEGLUDEC (TRESIBA  FLEXTOUCH) 100 UNIT/ML FLEXTOUCH PEN    Inject 20 Units into the skin daily.   INSULIN  LISPRO (HUMALOG) 100 UNIT/ML INJECTION    Inject 0-10 Units into the skin 2 (two) times daily with a meal. Inject 0-10 units into the skin before meals, PER SLIDING SCALE: BGL 0-200 = give nothing, 201-250 = 2 units, 251-300 = 4 units, 301-350 = 6 units, 351-400 = 8 units, >400 = 10 units, push fluids, and repeat BGL check in 2 hours. If BGL remains >400 after 2 hours, CALL MD.   LEFLUNOMIDE  (ARAVA ) 10 MG TABLET    Take 1 tablet (10 mg total) by mouth daily.   LOPERAMIDE  (IMODIUM  A-D) 2 MG TABLET    Take 2 mg by mouth as needed for diarrhea or loose stools.   MAGNESIUM  OXIDE (MAG-OX) 400 MG TABLET    Take 400 mg by mouth daily with lunch.   MELATONIN 5 MG TABS    Take 5 mg by mouth at bedtime.   METOPROLOL  TARTRATE (LOPRESSOR ) 25 MG TABLET    Take 0.5 tablets (12.5 mg total) by mouth 2 (two) times daily.   MULTIPLE VITAMINS-MINERALS (MULTIVITAMIN WITH MINERALS) TABLET    Take 1 tablet by mouth at bedtime.   OMEGA 3 1000 MG CAPS    Take 2,000 mg by mouth daily with  lunch.   OMEPRAZOLE  (PRILOSEC) 20 MG CAPSULE    Take 1 capsule (20 mg total) by mouth daily.   POTASSIUM CITRATE (UROCIT-K) 10 MEQ (1080 MG) SR TABLET    Take 10 mEq by mouth 2 (two) times daily.   TAMSULOSIN  (FLOMAX ) 0.4 MG CAPS CAPSULE    Take 0.8 mg by mouth at bedtime.   TRAZODONE  (DESYREL ) 100 MG TABLET    Take 100 mg by mouth at bedtime.   VITAMIN B-12 (CYANOCOBALAMIN ) 1000 MCG TABLET    Take 1,000 mcg by mouth daily.   VITAMIN E  180 MG (400 UNITS) CAPSULE    Take 400 Units by mouth daily.  Modified Medications   No medications on file  Discontinued Medications   No medications on file    Subjective: Discussed the use of AI scribe software for clinical note transcription with the patient, who gave verbal consent to proceed.   78 Y O male with multiple  co-morbidities as below including DM2, RA, Chronic Anemia/GIB requiring transfusions, HTN, HLD, CKD, DVT on AC, AS s/p TAVR, CVA, Congitive deficit, recurrent falls with h/o SDH who is here for follow up for liver abscess   After being seen by Dr Shereen Dike 4/22, Re-admitted 5/16-5/23 for acute on chronic blood loss anemia, acute CHF exacerbation. Evaluated by GI and underwent push enteroscopy 5/19 with small jejunal avm treated with APC + esophageal varices without stigmata of bleeding. Patient declined AC due to GI bleed. CT imaging 5/16 with decrease in size of inferior right hepatic lobe hypodensity, now measuring 11 mm, previously 2.5 cm. Discharged on PO ciprofloxacin  and metronidazole  for 7 more days on 5/23.   6/5 Accompanied by wife. Per MAR not on antibiotics ( on leflunomide  10mg  po daily). Some loose stools but denies nausea, vomiting, diarrhea or abdominal pain. Appetite is OK. He is unable to stand or walk without assistance and uses a walker. He is undergoing physical therapy to improve mobility.   Review of Systems: all systems reviewed with pertinent positives and negatives as listed above   Past Medical History:  Diagnosis Date    Anemia    Essential hypertension 07/18/2017   Family history of adverse reaction to anesthesia    Foot drop, left foot    due to back surgery in 01/2021   GERD (gastroesophageal reflux disease)    History of colonic polyps    History of hiatal hernia    many years ago   History of rheumatic fever    History of stroke 07/18/2017   Hypertriglyceridemia 07/18/2017   S/P TAVR (transcatheter aortic valve replacement) 01/26/2022   Sharman Debar 29mm via TF approach with Dr. Lorie Rook and Dr. Sherene Dilling   Severe aortic stenosis    Type 2 diabetes mellitus with diabetic neuropathy, unspecified (HCC) 07/18/2017   Past Surgical History:  Procedure Laterality Date   BIOPSY OF SKIN SUBCUTANEOUS TISSUE AND/OR MUCOUS MEMBRANE  03/05/2024   Procedure: BIOPSY, SKIN, SUBCUTANEOUS TISSUE, OR MUCOUS MEMBRANE;  Surgeon: Elois Hair, MD;  Location: [facility] ENDOSCOPY;  Service: Gastroenterology;;   COLONOSCOPY     around 2018 [facility]   COLONOSCOPY  09/02/2020   CRANIOTOMY Left 10/25/2019   Procedure: CRANIOTOMY HEMATOMA EVACUATION SUBDURAL;  Surgeon: Van Gelinas, MD;  Location: [facility] OR;  Service: Neurosurgery;  Laterality: Left;   ENTEROSCOPY N/A 03/05/2024   Procedure: ENTEROSCOPY;  Surgeon: Elois Hair, MD;  Location: [facility] ENDOSCOPY;  Service: Gastroenterology;  Laterality: N/A;   ESOPHAGOGASTRODUODENOSCOPY     around 2018 with [facility]   ESOPHAGOGASTRODUODENOSCOPY N/A 01/17/2024   Procedure: EGD (ESOPHAGOGASTRODUODENOSCOPY);  Surgeon: Normie Becton., MD;  Location: [facility] ENDOSCOPY;  Service: Gastroenterology;  Laterality: N/A;   FOOT CAPSULOTOMY Left 07/25/2008   Mid Foot #2 MPJ   Hammertoe Repair Left 07/25/2008   #2 toe   HAND SURGERY     nodule removed from left thumb and right index finger, Dr. Claudius Cumins   HOT HEMOSTASIS N/A 03/05/2024   Procedure: EGD, WITH ARGON PLASMA COAGULATION;  Surgeon: Elois Hair, MD;  Location: [facility] ENDOSCOPY;  Service: Gastroenterology;   Laterality: N/A;   INTRAOPERATIVE TRANSTHORACIC ECHOCARDIOGRAM N/A 01/26/2022   Procedure: INTRAOPERATIVE TRANSTHORACIC ECHOCARDIOGRAM;  Surgeon: Kyra Phy, MD;  Location: [facility] OR;  Service: Open Heart Surgery;  Laterality: N/A;   MOHS SURGERY  01/25/2023   right arm   RIGHT/LEFT HEART CATH AND CORONARY ANGIOGRAPHY N/A 12/11/2021   Procedure: RIGHT/LEFT HEART CATH AND CORONARY ANGIOGRAPHY;  Surgeon:  Lucendia Rusk, MD;  Location: [facility] INVASIVE CV LAB;  Service: Cardiovascular;  Laterality: N/A;   SCLEROTHERAPY  03/05/2024   Procedure: SCLEROTHERAPY;  Surgeon: Elois Hair, MD;  Location: [facility] ENDOSCOPY;  Service: Gastroenterology;;   SPINAL FUSION  01/2021   TARSAL TUNNEL RELEASE Left 07/25/2008   TONSILLECTOMY     removed as a child   TRANSCATHETER AORTIC VALVE REPLACEMENT, TRANSFEMORAL N/A 01/26/2022   Procedure: Transcatheter Aortic Valve Replacement , Transfemoral;  Surgeon: Kyra Phy, MD;  Location: [facility] OR;  Service: Open Heart Surgery;  Laterality: N/A;  Percutaneous   UPPER GASTROINTESTINAL ENDOSCOPY  09/02/2020    Social History   Tobacco Use   Smoking status: Former    Current packs/day: 0.00    Average packs/day: 1 pack/day for 10.0 years (10.0 ttl pk-yrs)    Types: Cigarettes    Start date: 1963    Quit date: 26    Years since quitting: 52.4    Passive exposure: Past   Smokeless tobacco: Never  Vaping Use   Vaping status: Never Used  Substance Use Topics   Alcohol use: Not Currently   Drug use: No    Family History  Problem Relation Age of Onset   Hyperlipidemia Mother    Colon polyps Mother    Hyperlipidemia Father    Alcoholism Brother    Healthy Son    Healthy Son    Cancer Neg Hx    Colon cancer Neg Hx    Esophageal cancer Neg Hx    Rectal cancer Neg Hx    Stomach cancer Neg Hx     Allergies  Allergen Reactions   Canagliflozin Other (See Comments)    Pt reports that the use of Invokana caused acute KIDNEY FAILURE ([facility]  records) and "allergic," per [facility]   Aspirin  Other (See Comments)    Gastroesophageal reflux and "allergic," per MAR    Atorvastatin Other (See Comments)    Myalgias, GI upset, and "allergic," per [facility]   Statins Other (See Comments)    Muscle pain- "allergic," per [facility]    Health Maintenance  Topic Date Due   OPHTHALMOLOGY EXAM  01/13/2024   INFLUENZA VACCINE  05/18/2024   Diabetic kidney evaluation - Urine ACR  06/14/2024   Medicare Annual Wellness (AWV)  11/03/2024   Diabetic kidney evaluation - eGFR measurement  03/08/2025   Colonoscopy  09/03/2027   DTaP/Tdap/Td (2 - Tdap) 12/24/2028   Pneumonia Vaccine 55+ Years old  Completed   HPV VACCINES  Aged Out   Meningococcal B Vaccine  Aged Out   FOOT EXAM  Discontinued   HEMOGLOBIN A1C  Discontinued   COVID-19 Vaccine  Discontinued   Hepatitis C Screening  Discontinued   Zoster Vaccines- Shingrix  Discontinued    Objective: BP 120/70   Pulse 84   Resp 16   Ht 5\' 11"  (1.803 m)   Wt 238 lb (108 kg)   BMI 33.19 kg/m    Physical Exam Constitutional:      Appearance: Normal appearance.  HENT:     Head: Normocephalic and atraumatic.      Mouth: Mucous membranes are moist.  Eyes:    Conjunctiva/sclera: Conjunctivae normal.     Pupils: Pupils are equal, round, and b/l symmetrical    Cardiovascular:     Rate and Rhythm: Normal rate and regular rhythm.     Heart sounds: s1s2  Pulmonary:     Effort: Pulmonary effort is normal.     Breath sounds: Normal  breath sounds.   Abdominal:     General: Non distended     Palpations: soft. Non tender  Musculoskeletal:        General: sitting in the wheel chair   Skin:    General: Skin is warm and dry.     Comments:  Neurological:     General: grossly non focal     Mental Status: awake, alert and oriented to person, place, and time.   Psychiatric:        Mood and Affect: Mood normal.   Lab Results Lab Results  Component Value Date   WBC 9.4 03/08/2024   HGB 7.8 (L)  03/08/2024   HCT 24.4 (L) 03/08/2024   MCV 82.4 03/08/2024   PLT 234 03/08/2024    Lab Results  Component Value Date   CREATININE 2.34 (H) 03/08/2024   BUN 44 (H) 03/08/2024   NA 135 03/08/2024   K 3.5 03/08/2024   CL 106 03/08/2024   CO2 20 (L) 03/08/2024    Lab Results  Component Value Date   ALT 43 03/07/2024   AST 59 (H) 03/07/2024   ALKPHOS 117 03/07/2024   BILITOT 0.7 03/07/2024    Lab Results  Component Value Date   CHOL 161 06/15/2023   HDL 18.80 (L) 06/15/2023   LDLCALC 78 06/29/2022   LDLDIRECT 84.0 06/15/2023   TRIG (H) 06/15/2023    611.0 Triglyceride is over 400; calculations on Lipids are invalid.   CHOLHDL 9 06/15/2023   No results found for: "LABRPR", "RPRTITER" No results found for: "HIV1RNAQUANT", "HIV1RNAVL", "CD4TABS"   Microbiology Results for orders placed or performed during the hospital encounter of 03/02/24  MRSA Next Gen by PCR, Nasal     Status: Abnormal   Collection Time: 03/03/24  2:32 AM   Specimen: Nasal Mucosa; Nasal Swab  Result Value Ref Range Status   MRSA by PCR Next Gen DETECTED (A) NOT DETECTED Final    Comment: RESULT CALLED TO, READ BACK BY AND VERIFIED WITH: W STANLEY RN 03/03/2024 @ 0500 BY AB (NOTE) The GeneXpert MRSA Assay (FDA approved for NASAL specimens only), is one component of a comprehensive MRSA colonization surveillance program. It is not intended to diagnose MRSA infection nor to guide or monitor treatment for MRSA infections. Test performance is not FDA approved in patients less than 74 years old. Performed at [facility], [address], [place], [place] [address]   C Difficile Quick Screen w PCR reflex     Status: None   Collection Time: 03/03/24  1:14 PM   Specimen: STOOL  Result Value Ref Range Status   C Diff antigen NEGATIVE NEGATIVE Final   C Diff toxin NEGATIVE NEGATIVE Final   C Diff interpretation No C. difficile detected.  Final    Comment: Performed at [facility], [address], [place], [place] [address]   Imaging DG Shoulder Left Result Date: 03/07/2024 EXAM: 3 VIEW(S) XRAY OF THE LEFT SHOULDER 03/07/2024 03:32:00 PM COMPARISON: CT of the chest 03/02/2024 and 2-view chest x-ray 01/22/2022. CLINICAL HISTORY: L shoulder pain FINDINGS: BONES AND JOINTS: Glenohumeral joint is normally aligned. No evidence of acute fracture or dislocation. Degenerative changes are present at the acromion and greater tuberosity. SOFT TISSUES: No abnormal calcifications. Visualized lung is unremarkable. IMPRESSION: 1. No acute findings. 2. Degenerative changes at the acromion and greater tuberosity. Electronically signed by: Audree Leas MD 03/07/2024 07:20 PM EDT RP Workstation: [id number]   VAS US  LOWER EXTREMITY VENOUS (DVT)  Result Date: 03/05/2024  Lower Venous DVT Study Patient Name:  ADONAY SCHEIER  Date of Exam:   03/05/2024 Medical Rec #: [id number]          Accession #:    [id number] Date of Birth: 01-13-1946           Patient Gender: M Patient Age:   77 years Exam Location:  [facility] Procedure:      VAS US  LOWER EXTREMITY VENOUS (DVT) Referring Phys: Michela Aguas --------------------------------------------------------------------------------  Indications: Edema.  Anticoagulation: Eliquis  (prior to admission). Limitations: Body habitus and poor ultrasound/tissue interface. Comparison Study: No previous exams Performing Technologist: Jody Hill RVT, RDMS  Examination Guidelines: A complete evaluation includes B-mode imaging, spectral Doppler, color Doppler, and power Doppler as needed of all accessible portions of each vessel. Bilateral testing is considered an integral part of a complete examination. Limited examinations for reoccurring indications may be performed as noted. The reflux portion of the exam is performed with the patient in reverse Trendelenburg.  +---------+---------------+---------+-----------+----------+--------------+ RIGHT     CompressibilityPhasicitySpontaneityPropertiesThrombus Aging +---------+---------------+---------+-----------+----------+--------------+ CFV      Full           Yes      Yes                                 +---------+---------------+---------+-----------+----------+--------------+ SFJ      Full                                                        +---------+---------------+---------+-----------+----------+--------------+ FV Prox  Full           Yes      Yes                                 +---------+---------------+---------+-----------+----------+--------------+ FV Mid   Full           Yes      Yes                                 +---------+---------------+---------+-----------+----------+--------------+ FV DistalFull           Yes      Yes                                 +---------+---------------+---------+-----------+----------+--------------+ PFV      Full                                                        +---------+---------------+---------+-----------+----------+--------------+ POP      Full           Yes      Yes                                 +---------+---------------+---------+-----------+----------+--------------+ PTV      Full                                                        +---------+---------------+---------+-----------+----------+--------------+  PERO     Full                                                        +---------+---------------+---------+-----------+----------+--------------+   +---------+---------------+---------+-----------+----------+--------------+ LEFT     CompressibilityPhasicitySpontaneityPropertiesThrombus Aging +---------+---------------+---------+-----------+----------+--------------+ CFV      Full           Yes      Yes                                 +---------+---------------+---------+-----------+----------+--------------+ SFJ      Full                                                         +---------+---------------+---------+-----------+----------+--------------+ FV Prox  Full           Yes      Yes                                 +---------+---------------+---------+-----------+----------+--------------+ FV Mid   Full           Yes      Yes                                 +---------+---------------+---------+-----------+----------+--------------+ FV DistalFull           Yes      Yes                                 +---------+---------------+---------+-----------+----------+--------------+ PFV      Full                                                        +---------+---------------+---------+-----------+----------+--------------+ POP      Full           Yes      Yes                                 +---------+---------------+---------+-----------+----------+--------------+ PTV      Full                                                        +---------+---------------+---------+-----------+----------+--------------+ PERO     Full                                                        +---------+---------------+---------+-----------+----------+--------------+  Summary: BILATERAL: - No evidence of deep vein thrombosis seen in the lower extremities, bilaterally. -No evidence of popliteal cyst, bilaterally. -Subcutaneous edema, bilaterally.  *See table(s) above for measurements and observations. Electronically signed by Runell Countryman on 03/05/2024 at 3:45:14 PM.    Final    VAS US  UPPER EXTREMITY VENOUS DUPLEX Result Date: 03/05/2024 UPPER VENOUS STUDY  Patient Name:  TARVIS BLOSSOM  Date of Exam:   03/05/2024 Medical Rec #: [id number]          Accession #:    [id number] Date of Birth: [birth year]           Patient Gender: M Patient Age:   43 years Exam Location:  [facility] Procedure:      VAS US  UPPER EXTREMITY VENOUS DUPLEX Referring Phys: Michela Aguas  --------------------------------------------------------------------------------  Indications: Follow up exam Anticoagulation: Eliquis  (prior to admission). Comparison Study: Previous exam on 01/19/2024 SVT of basilic and cephalic veins,                   DVT of radial vein Performing Technologist: Jody Hill RVT, RDMS  Examination Guidelines: A complete evaluation includes B-mode imaging, spectral Doppler, color Doppler, and power Doppler as needed of all accessible portions of each vessel. Bilateral testing is considered an integral part of a complete examination. Limited examinations for reoccurring indications may be performed as noted.  Right Findings: +----------+------------+---------+-----------+----------+-------+ RIGHT     CompressiblePhasicitySpontaneousPropertiesSummary +----------+------------+---------+-----------+----------+-------+ IJV           Full       Yes       Yes                      +----------+------------+---------+-----------+----------+-------+ Subclavian    Full       Yes       Yes                      +----------+------------+---------+-----------+----------+-------+ Axillary      Full       Yes       Yes                      +----------+------------+---------+-----------+----------+-------+ Brachial      Full       Yes       Yes                      +----------+------------+---------+-----------+----------+-------+ Radial        Full                                          +----------+------------+---------+-----------+----------+-------+ Ulnar         Full                                          +----------+------------+---------+-----------+----------+-------+ Cephalic      Full                                          +----------+------------+---------+-----------+----------+-------+ Basilic       Full       Yes       Yes                      +----------+------------+---------+-----------+----------+-------+  Left  Findings: +----------+------------+---------+-----------+----------+-------+ LEFT      CompressiblePhasicitySpontaneousPropertiesSummary +----------+------------+---------+-----------+----------+-------+ Subclavian    Full       Yes       Yes                      +----------+------------+---------+-----------+----------+-------+  Summary:  Right: No evidence of deep vein thrombosis in the upper extremity. No evidence of superficial vein thrombosis in the upper extremity.  Left: No evidence of thrombosis in the subclavian.  *See table(s) above for measurements and observations.  Diagnosing physician: Runell Countryman Electronically signed by Runell Countryman on 03/05/2024 at 3:44:58 PM.    Final    CT ABDOMEN PELVIS WO CONTRAST Result Date: 03/03/2024 CLINICAL DATA:  Blunt trauma yesterday, fell, with anemia. Looking for retroperitoneal bleed. EXAM: CT ABDOMEN AND PELVIS WITHOUT CONTRAST TECHNIQUE: Multidetector CT imaging of the abdomen and pelvis was performed following the standard protocol without IV contrast. RADIATION DOSE REDUCTION: This exam was performed according to the departmental dose-optimization program which includes automated exposure control, adjustment of the mA and/or kV according to patient size and/or use of iterative reconstruction technique. COMPARISON:  CT chest, abdomen and pelvis without contrast yesterday at 6:28 p.m., CT abdomen and pelvis without contrast 02/14/2024. FINDINGS: Lower chest: There are minimal bilateral layering pleural effusions new since yesterday's exam. There is mild interstitial edema in both lung bases also new, possibly due to fluid overload congestive. There is increased bilateral lower lobe bronchial thickening which could be bronchitic or congestive. No focal infiltrate is seen. The heart is slightly enlarged. The cardiac blood pool is less dense than the myocardium consistent with anemia. There is a TAVR, and calcification in the coronary arteries and  mitral ring. Small hiatal hernia. Hepatobiliary: There is a 2.2 cm hypodense lesion in hepatic segment 5, evaluated on MRI 01/16/2024 and thought to be infectious, on MRI was about double the size, unchanged since yesterday, was 2.5 cm on 02/14/2024. There is a stable 1 cm nonspecific hypodensity in segment 8 on 3:20, indeterminate. Rest of the unenhanced liver is homogeneous. The gallbladder is absent without biliary dilatation. Pancreas: Unremarkable without contrast. Spleen: Unremarkable without contrast. Adrenals/Urinary Tract: No adrenal mass. 2.3 cm Bosniak 1 cyst superior pole left kidney requires no follow-up imaging. No other contour deforming abnormality of the unenhanced kidneys is seen. There is no urinary stone or obstruction. No bladder thickening. Stomach/Bowel: Negative for dilatation or wall thickening. Uncomplicated sigmoid diverticulosis. Normal appendix. Vascular/Lymphatic: Aortic atherosclerosis. No enlarged abdominal or pelvic lymph nodes. Reproductive: Prostate is unremarkable. Other: Bilateral fat hernias. No incarcerated hernia. Trace presacral ascites. There is no free hemorrhage. Body wall edema again seen in the flanks and thighs. Musculoskeletal: Bilateral rod and pedicle screw fusion construct T10-S1 is again noted with bilateral sacroiliac joint bolts. 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 Osteopenia. IMPRESSION: 1. No evidence of retroperitoneal bleed. 2. Minimal bilateral pleural effusions and mild interstitial edema in the lung bases, new since yesterday's exam, possibly due to fluid overload or congestive. 3. Increased bilateral lower lobe bronchial thickening which could be bronchitic or congestive. 4. Mild cardiomegaly with TAVR. Aortic and coronary artery atherosclerosis. 5. 2.2 cm hypodense lesion in hepatic segment 5, evaluated on MRI 01/16/2024 and thought to be infectious, unchanged since yesterday, was 2.5 cm on 02/14/2024. 6. 1 cm nonspecific  hypodensity in segment 8 of the liver, unchanged. 7. Trace presacral ascites. 8. Body wall edema in the flanks and thighs. 9. Diverticulosis without evidence  of diverticulitis. 10. Osteopenia, postsurgical and degenerative change. Aortic Atherosclerosis (ICD10-I70.0).  Or Electronically Signed   By: Denman Fischer M.D.   On: 03/03/2024 07:03   CT CHEST ABDOMEN PELVIS WO CONTRAST Result Date: 03/02/2024 CLINICAL DATA:  Blunt trauma EXAM: CT CHEST, ABDOMEN AND PELVIS WITHOUT CONTRAST TECHNIQUE: Multidetector CT imaging of the chest, abdomen and pelvis was performed following the standard protocol without IV contrast. RADIATION DOSE REDUCTION: This exam was performed according to the departmental dose-optimization program which includes automated exposure control, adjustment of the mA and/or kV according to patient size and/or use of iterative reconstruction technique. COMPARISON:  CT 02/14/2024, 01/15/2024, MRI 01/16/2024 FINDINGS: CT CHEST FINDINGS Cardiovascular: Limited evaluation without intravenous contrast.aortic valve prosthesis. Dense mitral calcification. Coronary vascular calcification. Normal cardiac size. No pericardial effusion Mediastinum/Nodes: Patent trachea. No thyroid mass. No suspicious lymph nodes. Esophagus within normal limits. Lungs/Pleura: Lungs are clear. No pleural effusion or pneumothorax. Subpleural left lower lobe pulmonary nodule measures 6 mm on series 4, image 79, previously 7 mm. Mild dependent atelectasis Musculoskeletal: Sternum appears intact. No acute osseous abnormality. CT ABDOMEN PELVIS FINDINGS Hepatobiliary: Cholecystectomy. No biliary dilatation. Further decrease in size of a inferior right hepatic lobe hypodensity, now measuring 11 mm maximum on series 3, image 68, previously 2.5 cm. Pancreas: Unremarkable. No pancreatic ductal dilatation or surrounding inflammatory changes. Spleen: Normal in size without focal abnormality. Adrenals/Urinary Tract: Adrenal glands are  normal. Slight prominence of right renal pelvis and ureter but no obstructing stone. The bladder is normal Stomach/Bowel: Stomach nonenlarged. No dilated small bowel. Diffuse fluid in the colon. Negative appendix. No acute bowel wall thickening. Mild diverticular disease of the left colon Vascular/Lymphatic: Aortic atherosclerosis. No enlarged abdominal or pelvic lymph nodes. Reproductive: Prostate is unremarkable. Other: Negative for pelvic effusion or free air. Subcutaneous edema at the bilateral hips. Musculoskeletal: Posterior spinal fusion hardware extends from T10 to the upper sacrum. There are bilateral sacroiliac screws. No acute osseous abnormality. IMPRESSION: 1. Negative for acute intrathoracic, intra-abdominal, or intrapelvic abnormality. 2. Diffuse fluid in the colon suggesting diarrheal illness. Mild diverticular disease of the left colon without acute inflammatory process. 3. Further decrease in size of inferior right hepatic lobe hypodensity, now measuring 11 mm, previously 2.5 cm. 4. 6 mm left lower lobe pulmonary nodule, previously 7 mm. This is grossly stable as compared with November 2024 limited lung bases on abdominal CT. Future CT at 18-24 months (from 08/21/2023 scan) is considered optional for low-risk patients, but is recommended for high-risk patients. This recommendation follows the consensus statement: Guidelines for Management of Incidental Pulmonary Nodules Detected on CT Images: From the Fleischner Society 2017; Radiology 2017; 284:228-243. 5. Aortic atherosclerosis. Aortic Atherosclerosis (ICD10-I70.0). Electronically Signed   By: Esmeralda Hedge M.D.   On: 03/02/2024 19:02   CT HEAD WO CONTRAST ( ) Result Date: 03/02/2024 CLINICAL DATA:  Head trauma abnormal mental status EXAM: CT HEAD WITHOUT CONTRAST TECHNIQUE: Contiguous axial images were obtained from the base of the skull through the vertex without intravenous contrast. RADIATION DOSE REDUCTION: This exam was performed  according to the departmental dose-optimization program which includes automated exposure control, adjustment of the mA and/or kV according to patient size and/or use of iterative reconstruction technique. COMPARISON:  CT brain 01/15/2024, 12/09/2021, MRI 03/06/2021 FINDINGS: Brain: No acute territorial infarction, hemorrhage, or intracranial mass. Chronic encephalomalacia at the inferolateral left frontal lobe. Chronic left dural thickening. Stable ventricle size. Atrophy and chronic small vessel ischemic changes of the white matter Vascular: No hyperdense vessels.  Carotid vascular  calcification Skull: Left-sided craniotomy.  No fracture Sinuses/Orbits: No acute finding. Other: None IMPRESSION: 1. No CT evidence for acute intracranial abnormality. 2. Atrophy and chronic small vessel ischemic changes of the white matter. Chronic encephalomalacia at the inferolateral left frontal lobe. Electronically Signed   By: Esmeralda Hedge M.D.   On: 03/02/2024 18:43    Assessment/Plan # Liver abscess  5/17 with improving size of liver abscess ( 2.2 cm, down from 2.5 cm)   Plan  - Restart PO ciprofloxacin  500mg  po bid + metronidazole  500mg  po bid until next visit - CT abd/pelvis wo - 5/22 CBC and BMP reviewed, no labs today  - Fu in 2 weeks after repeat CT   # RA  - Enbrel  on hold  - on leflunomide  10 mg po daily   I spent 38  minutes involved in face-to-face and non-face-to-face activities for this patient on the day of the visit. Professional time spent includes the following activities: Preparing to see the patient (review of tests), Obtaining and reviewing separately obtained history (discharge record 5/23 and prior Dr Shereen Dike note), Performing a medically appropriate examination and evaluation , Ordering medications, Documenting clinical information in the EMR, Independently interpreting results (not separately reported), Communicating results to the patient/wife, Counseling and educating the patient/wife and  Care coordination (not separately reported).   Of note, portions of this note may have been created with voice recognition software. While this note has been edited for accuracy, occasional wrong-word or 'sound-a-like' substitutions may have occurred due to the inherent limitations of voice recognition software.   Melvina Stage, MD [facility] 03/22/2024, 10:59 AM

## 2024-03-22 NOTE — Telephone Encounter (Signed)
 Sent orders with patient and wife to facility. CT will be authorized by Daivd Dub and she will call facility with scheduling information. Patient to follow up 04/19/24 with Dr Shereen Dike. I will call to verify orders were rcvd after patient has time to return to facility.

## 2024-03-23 DIAGNOSIS — R2681 Unsteadiness on feet: Secondary | ICD-10-CM | POA: Diagnosis not present

## 2024-03-23 DIAGNOSIS — R2689 Other abnormalities of gait and mobility: Secondary | ICD-10-CM | POA: Diagnosis not present

## 2024-03-23 DIAGNOSIS — M6281 Muscle weakness (generalized): Secondary | ICD-10-CM | POA: Diagnosis not present

## 2024-03-23 DIAGNOSIS — K750 Abscess of liver: Secondary | ICD-10-CM | POA: Diagnosis not present

## 2024-03-26 DIAGNOSIS — M6281 Muscle weakness (generalized): Secondary | ICD-10-CM | POA: Diagnosis not present

## 2024-03-26 DIAGNOSIS — K750 Abscess of liver: Secondary | ICD-10-CM | POA: Diagnosis not present

## 2024-03-26 DIAGNOSIS — R2689 Other abnormalities of gait and mobility: Secondary | ICD-10-CM | POA: Diagnosis not present

## 2024-03-26 DIAGNOSIS — R2681 Unsteadiness on feet: Secondary | ICD-10-CM | POA: Diagnosis not present

## 2024-03-27 DIAGNOSIS — R2681 Unsteadiness on feet: Secondary | ICD-10-CM | POA: Diagnosis not present

## 2024-03-27 DIAGNOSIS — R2689 Other abnormalities of gait and mobility: Secondary | ICD-10-CM | POA: Diagnosis not present

## 2024-03-27 DIAGNOSIS — M6281 Muscle weakness (generalized): Secondary | ICD-10-CM | POA: Diagnosis not present

## 2024-03-27 DIAGNOSIS — K750 Abscess of liver: Secondary | ICD-10-CM | POA: Diagnosis not present

## 2024-03-28 DIAGNOSIS — R2689 Other abnormalities of gait and mobility: Secondary | ICD-10-CM | POA: Diagnosis not present

## 2024-03-28 DIAGNOSIS — K750 Abscess of liver: Secondary | ICD-10-CM | POA: Diagnosis not present

## 2024-03-28 DIAGNOSIS — M6281 Muscle weakness (generalized): Secondary | ICD-10-CM | POA: Diagnosis not present

## 2024-03-28 DIAGNOSIS — R2681 Unsteadiness on feet: Secondary | ICD-10-CM | POA: Diagnosis not present

## 2024-03-29 DIAGNOSIS — R2689 Other abnormalities of gait and mobility: Secondary | ICD-10-CM | POA: Diagnosis not present

## 2024-03-29 DIAGNOSIS — K750 Abscess of liver: Secondary | ICD-10-CM | POA: Diagnosis not present

## 2024-03-29 DIAGNOSIS — M6281 Muscle weakness (generalized): Secondary | ICD-10-CM | POA: Diagnosis not present

## 2024-03-29 DIAGNOSIS — R2681 Unsteadiness on feet: Secondary | ICD-10-CM | POA: Diagnosis not present

## 2024-03-30 DIAGNOSIS — R2689 Other abnormalities of gait and mobility: Secondary | ICD-10-CM | POA: Diagnosis not present

## 2024-03-30 DIAGNOSIS — K750 Abscess of liver: Secondary | ICD-10-CM | POA: Diagnosis not present

## 2024-03-30 DIAGNOSIS — R2681 Unsteadiness on feet: Secondary | ICD-10-CM | POA: Diagnosis not present

## 2024-03-30 DIAGNOSIS — M6281 Muscle weakness (generalized): Secondary | ICD-10-CM | POA: Diagnosis not present

## 2024-04-02 DIAGNOSIS — E785 Hyperlipidemia, unspecified: Secondary | ICD-10-CM | POA: Diagnosis not present

## 2024-04-02 DIAGNOSIS — F0390 Unspecified dementia without behavioral disturbance: Secondary | ICD-10-CM | POA: Diagnosis not present

## 2024-04-02 DIAGNOSIS — I509 Heart failure, unspecified: Secondary | ICD-10-CM | POA: Diagnosis not present

## 2024-04-02 DIAGNOSIS — Z6839 Body mass index (BMI) 39.0-39.9, adult: Secondary | ICD-10-CM | POA: Diagnosis not present

## 2024-04-02 DIAGNOSIS — N183 Chronic kidney disease, stage 3 unspecified: Secondary | ICD-10-CM | POA: Diagnosis not present

## 2024-04-03 DIAGNOSIS — K750 Abscess of liver: Secondary | ICD-10-CM | POA: Diagnosis not present

## 2024-04-03 DIAGNOSIS — R2681 Unsteadiness on feet: Secondary | ICD-10-CM | POA: Diagnosis not present

## 2024-04-03 DIAGNOSIS — R2689 Other abnormalities of gait and mobility: Secondary | ICD-10-CM | POA: Diagnosis not present

## 2024-04-03 DIAGNOSIS — M6281 Muscle weakness (generalized): Secondary | ICD-10-CM | POA: Diagnosis not present

## 2024-04-04 DIAGNOSIS — F32A Depression, unspecified: Secondary | ICD-10-CM | POA: Diagnosis not present

## 2024-04-04 DIAGNOSIS — K750 Abscess of liver: Secondary | ICD-10-CM | POA: Diagnosis not present

## 2024-04-04 DIAGNOSIS — M6281 Muscle weakness (generalized): Secondary | ICD-10-CM | POA: Diagnosis not present

## 2024-04-04 DIAGNOSIS — F0390 Unspecified dementia without behavioral disturbance: Secondary | ICD-10-CM | POA: Diagnosis not present

## 2024-04-04 DIAGNOSIS — R2689 Other abnormalities of gait and mobility: Secondary | ICD-10-CM | POA: Diagnosis not present

## 2024-04-04 DIAGNOSIS — F5104 Psychophysiologic insomnia: Secondary | ICD-10-CM | POA: Diagnosis not present

## 2024-04-04 DIAGNOSIS — R2681 Unsteadiness on feet: Secondary | ICD-10-CM | POA: Diagnosis not present

## 2024-04-05 ENCOUNTER — Ambulatory Visit (HOSPITAL_COMMUNITY)
Admission: RE | Admit: 2024-04-05 | Discharge: 2024-04-05 | Disposition: A | Discharge status: Home / Self Care | Source: Ambulatory Visit | Attending: Infectious Diseases | Admitting: Infectious Diseases

## 2024-04-05 DIAGNOSIS — K449 Diaphragmatic hernia without obstruction or gangrene: Secondary | ICD-10-CM | POA: Diagnosis not present

## 2024-04-05 DIAGNOSIS — K769 Liver disease, unspecified: Secondary | ICD-10-CM | POA: Diagnosis not present

## 2024-04-05 DIAGNOSIS — K750 Abscess of liver: Secondary | ICD-10-CM | POA: Insufficient documentation

## 2024-04-05 DIAGNOSIS — K5730 Diverticulosis of large intestine without perforation or abscess without bleeding: Secondary | ICD-10-CM | POA: Diagnosis not present

## 2024-04-05 DIAGNOSIS — N281 Cyst of kidney, acquired: Secondary | ICD-10-CM | POA: Diagnosis not present

## 2024-04-10 DIAGNOSIS — R2681 Unsteadiness on feet: Secondary | ICD-10-CM | POA: Diagnosis not present

## 2024-04-10 DIAGNOSIS — M6281 Muscle weakness (generalized): Secondary | ICD-10-CM | POA: Diagnosis not present

## 2024-04-10 DIAGNOSIS — R2689 Other abnormalities of gait and mobility: Secondary | ICD-10-CM | POA: Diagnosis not present

## 2024-04-10 DIAGNOSIS — K750 Abscess of liver: Secondary | ICD-10-CM | POA: Diagnosis not present

## 2024-04-11 DIAGNOSIS — R2681 Unsteadiness on feet: Secondary | ICD-10-CM | POA: Diagnosis not present

## 2024-04-11 DIAGNOSIS — R2689 Other abnormalities of gait and mobility: Secondary | ICD-10-CM | POA: Diagnosis not present

## 2024-04-11 DIAGNOSIS — K750 Abscess of liver: Secondary | ICD-10-CM | POA: Diagnosis not present

## 2024-04-11 DIAGNOSIS — M6281 Muscle weakness (generalized): Secondary | ICD-10-CM | POA: Diagnosis not present

## 2024-04-13 DIAGNOSIS — R2681 Unsteadiness on feet: Secondary | ICD-10-CM | POA: Diagnosis not present

## 2024-04-13 DIAGNOSIS — R2689 Other abnormalities of gait and mobility: Secondary | ICD-10-CM | POA: Diagnosis not present

## 2024-04-13 DIAGNOSIS — M6281 Muscle weakness (generalized): Secondary | ICD-10-CM | POA: Diagnosis not present

## 2024-04-13 DIAGNOSIS — K750 Abscess of liver: Secondary | ICD-10-CM | POA: Diagnosis not present

## 2024-04-16 DIAGNOSIS — M6281 Muscle weakness (generalized): Secondary | ICD-10-CM | POA: Diagnosis not present

## 2024-04-16 DIAGNOSIS — R2689 Other abnormalities of gait and mobility: Secondary | ICD-10-CM | POA: Diagnosis not present

## 2024-04-16 DIAGNOSIS — K750 Abscess of liver: Secondary | ICD-10-CM | POA: Diagnosis not present

## 2024-04-16 DIAGNOSIS — R2681 Unsteadiness on feet: Secondary | ICD-10-CM | POA: Diagnosis not present

## 2024-04-18 ENCOUNTER — Ambulatory Visit: Admitting: Gastroenterology

## 2024-04-18 DIAGNOSIS — M6281 Muscle weakness (generalized): Secondary | ICD-10-CM | POA: Diagnosis not present

## 2024-04-18 DIAGNOSIS — R2681 Unsteadiness on feet: Secondary | ICD-10-CM | POA: Diagnosis not present

## 2024-04-18 DIAGNOSIS — R296 Repeated falls: Secondary | ICD-10-CM | POA: Diagnosis not present

## 2024-04-18 DIAGNOSIS — K922 Gastrointestinal hemorrhage, unspecified: Secondary | ICD-10-CM | POA: Diagnosis not present

## 2024-04-18 DIAGNOSIS — R41841 Cognitive communication deficit: Secondary | ICD-10-CM | POA: Diagnosis not present

## 2024-04-18 DIAGNOSIS — K750 Abscess of liver: Secondary | ICD-10-CM | POA: Diagnosis not present

## 2024-04-18 DIAGNOSIS — R2689 Other abnormalities of gait and mobility: Secondary | ICD-10-CM | POA: Diagnosis not present

## 2024-04-18 DIAGNOSIS — F0390 Unspecified dementia without behavioral disturbance: Secondary | ICD-10-CM | POA: Diagnosis not present

## 2024-04-19 ENCOUNTER — Ambulatory Visit (INDEPENDENT_AMBULATORY_CARE_PROVIDER_SITE_OTHER): Admitting: Internal Medicine

## 2024-04-19 ENCOUNTER — Other Ambulatory Visit: Payer: Self-pay

## 2024-04-19 ENCOUNTER — Encounter: Payer: Self-pay | Admitting: Internal Medicine

## 2024-04-19 VITALS — BP 152/71 | HR 64 | Temp 97.9°F

## 2024-04-19 DIAGNOSIS — R2681 Unsteadiness on feet: Secondary | ICD-10-CM | POA: Diagnosis not present

## 2024-04-19 DIAGNOSIS — F0390 Unspecified dementia without behavioral disturbance: Secondary | ICD-10-CM | POA: Diagnosis not present

## 2024-04-19 DIAGNOSIS — R296 Repeated falls: Secondary | ICD-10-CM | POA: Diagnosis not present

## 2024-04-19 DIAGNOSIS — K750 Abscess of liver: Secondary | ICD-10-CM

## 2024-04-19 DIAGNOSIS — M6281 Muscle weakness (generalized): Secondary | ICD-10-CM | POA: Diagnosis not present

## 2024-04-19 DIAGNOSIS — K922 Gastrointestinal hemorrhage, unspecified: Secondary | ICD-10-CM | POA: Diagnosis not present

## 2024-04-19 DIAGNOSIS — R41841 Cognitive communication deficit: Secondary | ICD-10-CM | POA: Diagnosis not present

## 2024-04-19 DIAGNOSIS — R2689 Other abnormalities of gait and mobility: Secondary | ICD-10-CM | POA: Diagnosis not present

## 2024-04-19 NOTE — Patient Instructions (Signed)
 You have been on 3 months of antibiotics  The lesions in the liver remain there but not worse; these could be just sterilized fluid collection now   We'll need to stop antibiotics (ciprofloxacin , and metronidazole ); will check blood tests up to 3 months after you stop antibiotics. If you continue to do well, and blood tests normal/improving then the lesion would be considered cured   Labs today   See me in 6 weeks (sooner if fever, chill, increased persistent right sided liver area pain, decreased appetite, malaise)

## 2024-04-19 NOTE — Progress Notes (Signed)
 Patient Active Problem List   Diagnosis Date Noted   Liver abscess 03/22/2024   Medication management 03/22/2024   AVM (arteriovenous malformation) of small bowel, acquired with hemorrhage 03/05/2024   Acute heart failure with preserved ejection fraction (HFpEF) (HCC) 03/04/2024   GIB (gastrointestinal bleeding) 03/02/2024   Liver abscess due to bacteria 01/25/2024   DVT (deep venous thrombosis) (HCC) 01/25/2024   Acute urinary retention 01/25/2024   Urinary tract infection with hematuria 01/23/2024   Bacterial liver abscess 01/19/2024   Lung nodule 01/17/2024   Hepatic abscess 01/16/2024   Rheumatoid arteritis (HCC) 01/16/2024   Heme positive stool 01/16/2024   Abnormal CT of liver 01/16/2024   Acute on chronic anemia 01/16/2024   GI bleed 01/15/2024   Depression, major, single episode, in partial remission (HCC) 10/05/2023   BPH associated with nocturia 10/05/2023   Statin myopathy 06/15/2023   IDA (iron  deficiency anemia) 07/06/2022   Current mild episode of major depressive disorder without prior episode (HCC) 05/17/2022   Other chronic pain 03/05/2022   Bilateral lower extremity edema 03/05/2022   Severe aortic stenosis 01/26/2022   S/P TAVR (transcatheter aortic valve replacement) 01/26/2022   Microcytic anemia 12/10/2021   Obesity (BMI 30-39.9) 12/10/2021   Near syncope 12/09/2021   Aortic stenosis 12/09/2021   Rheumatoid arthritis (HCC) 12/09/2021   Foot drop, left foot 10/02/2021   History of colonic polyps 10/02/2021   History of rheumatic fever 10/02/2021   Stress fracture of left foot 09/03/2021   Gabapentin  overdose, accidental or unintentional, initial encounter 03/08/2021   TIA (transient ischemic attack) 03/05/2021   Body mass index (BMI) 36.0-36.9, adult 10/03/2020   Subdural hemorrhage following injury without open intracranial wound and with prolonged loss of consciousness (more than 24 hours) without return to pre-existing conscious level (HCC)  10/03/2020   AKI (acute kidney injury) (HCC)    Acute encephalopathy    SDH (subdural hematoma) (HCC) 10/24/2019   Cyclic vomiting syndrome 08/06/2019   Neuropathy 12/25/2018   AK (actinic keratosis) 08/24/2018   Neoplasm of uncertain behavior 07/27/2018   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   Tendinopathy of right gluteus medius 07/13/2018   Tendinopathy of left gluteus medius 07/13/2018   Squamous cell carcinoma in situ (SCCIS) of skin 03/24/2018   Vitamin D  deficiency 11/29/2017   Stage 3 chronic kidney disease (HCC) 10/28/2017   Primary osteoarthritis of right hip 09/11/2017   Essential hypertension 07/18/2017   Hyperlipidemia associated with type 2 diabetes mellitus (HCC) 07/18/2017   Type 2 diabetes mellitus with diabetic neuropathy, unspecified (HCC) 07/18/2017   Heart murmur 07/18/2017   Hypertriglyceridemia 07/18/2017   History of stroke 07/18/2017   DDD (degenerative disc disease), lumbar 06/15/2017   Iron  deficiency anemia 06/15/2017   Stroke (HCC) 06/15/2017   Encounter for hepatitis C screening test for low risk patient 06/30/2016   Anemia 06/19/2016   Microalbuminuria due to type 2 diabetes mellitus (HCC) 03/19/2016   Hypertrophy of inferior nasal turbinate 12/20/2015   Disease of nasal cavity and sinuses 12/10/2015   Degenerative tear of acetabular labrum of left hip 11/04/2015   History of tobacco abuse 05/15/2015   Morbid (severe) obesity due to excess calories (HCC) 04/11/2015   Disorder of both eustachian tubes 01/27/2015   Maxillary sinusitis 01/13/2015   Lipoprotein deficiency 01/31/2014   Gastro-esophageal reflux disease without esophagitis 11/02/2013   Rheumatic fever 11/02/2013   Erectile dysfunction associated with type 2 diabetes mellitus (HCC) 07/31/2013   Type 2 diabetes mellitus with hyperglycemia, without long-term current  use of insulin  (HCC) 11/16/2012    Patient's Medications  New Prescriptions   No medications on file  Previous Medications    ACETAMINOPHEN  (TYLENOL ) 325 MG TABLET    Take 650 mg by mouth every 6 (six) hours as needed for mild pain or headache.   CHOLECALCIFEROL  (VITAMIN D3 SUPER STRENGTH) 50 MCG (2000 UT) CAPS    Take 2,000 Units by mouth daily.   CIPROFLOXACIN  (CIPRO ) 500 MG TABLET    Take 500 mg by mouth 2 (two) times daily.   ESCITALOPRAM  (LEXAPRO ) 20 MG TABLET    Take 1 tablet (20 mg total) by mouth daily.   ETANERCEPT  (ENBREL  MINI) 50 MG/ML INJECTION    Inject 1 mL (50 mg total) into the skin once a week.   EZETIMIBE  (ZETIA ) 10 MG TABLET    Take 1 tablet (10 mg total) by mouth daily.   FENOFIBRATE  MICRONIZED (LOFIBRA) 200 MG CAPSULE    TAKE 1 CAPSULE DAILY BEFOREBREAKFAST   FINASTERIDE  (PROSCAR ) 5 MG TABLET    Take 1 tablet (5 mg total) by mouth daily.   FLUTICASONE  (FLONASE ) 50 MCG/ACT NASAL SPRAY    Place 2 sprays into both nostrils daily.   FUROSEMIDE  (LASIX ) 20 MG TABLET    Take 2 tablets (40 mg total) by mouth daily.   INSULIN  DEGLUDEC (TRESIBA  FLEXTOUCH) 100 UNIT/ML FLEXTOUCH PEN    Inject 20 Units into the skin daily.   INSULIN  LISPRO (HUMALOG) 100 UNIT/ML INJECTION    Inject 0-10 Units into the skin 2 (two) times daily with a meal. Inject 0-10 units into the skin before meals, PER SLIDING SCALE: BGL 0-200 = give nothing, 201-250 = 2 units, 251-300 = 4 units, 301-350 = 6 units, 351-400 = 8 units, >400 = 10 units, push fluids, and repeat BGL check in 2 hours. If BGL remains >400 after 2 hours, CALL MD.   LEFLUNOMIDE  (ARAVA ) 10 MG TABLET    Take 1 tablet (10 mg total) by mouth daily.   LOPERAMIDE  (IMODIUM  A-D) 2 MG TABLET    Take 2 mg by mouth as needed for diarrhea or loose stools.   LORAZEPAM  (ATIVAN ) 0.5 MG TABLET    Take by mouth.   MAGNESIUM  OXIDE (MAG-OX) 400 MG TABLET    Take 400 mg by mouth daily with lunch.   MELATONIN 5 MG TABS    Take 5 mg by mouth at bedtime.   METOPROLOL  TARTRATE (LOPRESSOR ) 25 MG TABLET    Take 0.5 tablets (12.5 mg total) by mouth 2 (two) times daily.   METRONIDAZOLE  (FLAGYL ) 500  MG TABLET    Take 500 mg by mouth 2 (two) times daily.   MULTIPLE VITAMINS-MINERALS (MULTIVITAMIN WITH MINERALS) TABLET    Take 1 tablet by mouth at bedtime.   OLANZAPINE (ZYPREXA) 2.5 MG TABLET    Take 2.5 mg by mouth at bedtime.   OLANZAPINE (ZYPREXA) 5 MG TABLET    Take 5 mg by mouth at bedtime.   OMEGA 3 1000 MG CAPS    Take 2,000 mg by mouth daily with lunch.   OMEPRAZOLE  (PRILOSEC) 20 MG CAPSULE    Take 1 capsule (20 mg total) by mouth daily.   POTASSIUM CITRATE (UROCIT-K) 10 MEQ (1080 MG) SR TABLET    Take 10 mEq by mouth 2 (two) times daily.   TAMSULOSIN  (FLOMAX ) 0.4 MG CAPS CAPSULE    Take 0.8 mg by mouth at bedtime.   TRAZODONE  (DESYREL ) 100 MG TABLET    Take 100 mg by mouth at bedtime.  VITAMIN B-12 (CYANOCOBALAMIN ) 1000 MCG TABLET    Take 1,000 mcg by mouth daily.   VITAMIN E  180 MG (400 UNITS) CAPSULE    Take 400 Units by mouth daily.  Modified Medications   No medications on file  Discontinued Medications   No medications on file    Subjective: Discussed the use of AI scribe software for clinical note transcription with the patient, who gave verbal consent to proceed.   1 Y O male with multiple co-morbidities as below including DM2, RA, Chronic Anemia/GIB requiring transfusions, HTN, HLD, CKD, DVT on AC, AS s/p TAVR, CVA, Congitive deficit, recurrent falls with h/o SDH who is here for follow up for liver abscess   After being seen by Dr Overton 4/22, Re-admitted 5/16-5/23 for acute on chronic blood loss anemia, acute CHF exacerbation. Evaluated by GI and underwent push enteroscopy 5/19 with small jejunal avm treated with APC + esophageal varices without stigmata of bleeding. Patient declined AC due to GI bleed. CT imaging 5/16 with decrease in size of inferior right hepatic lobe hypodensity, now measuring 11 mm, previously 2.5 cm. Discharged on PO ciprofloxacin  and metronidazole  for 7 more days on 5/23.   03/22/24 Accompanied by wife. Per MAR not on antibiotics ( on leflunomide  10mg   po daily). Some loose stools but denies nausea, vomiting, diarrhea or abdominal pain. Appetite is OK. He is unable to stand or walk without assistance and uses a walker. He is undergoing physical therapy to improve mobility.    04/19/24 id clinic f/u Lov 03/22/24 restarted on cipro /flagyl ; noted mid 02/2024 admision for gib; 7 day cipro  flagyl  given during admission 6/29 ct abd reviewed continued improving size hepatic lesion Reviewed snf mar confirmed cipro /flagyl  given  Cipro /flagyl  was stopped by nursing home early-vs mid may  Review of Systems: all systems reviewed with pertinent positives and negatives as listed above   Past Medical History:  Diagnosis Date   Anemia    Essential hypertension 07/18/2017   Family history of adverse reaction to anesthesia    Foot drop, left foot    due to back surgery in 01/2021   GERD (gastroesophageal reflux disease)    History of colonic polyps    History of hiatal hernia    many years ago   History of rheumatic fever    History of stroke 07/18/2017   Hypertriglyceridemia 07/18/2017   S/P TAVR (transcatheter aortic valve replacement) 01/26/2022   Celestia MOLDER 29mm via TF approach with Dr. Wendel and Dr. Lucas   Severe aortic stenosis    Type 2 diabetes mellitus with diabetic neuropathy, unspecified (HCC) 07/18/2017   Past Surgical History:  Procedure Laterality Date   BIOPSY OF SKIN SUBCUTANEOUS TISSUE AND/OR MUCOUS MEMBRANE  03/05/2024   Procedure: BIOPSY, SKIN, SUBCUTANEOUS TISSUE, OR MUCOUS MEMBRANE;  Surgeon: Stacia Glendia BRAVO, MD;  Location: [facility] ENDOSCOPY;  Service: Gastroenterology;;   COLONOSCOPY     around 2018 [facility]   COLONOSCOPY  09/02/2020   CRANIOTOMY Left 10/25/2019   Procedure: CRANIOTOMY HEMATOMA EVACUATION SUBDURAL;  Surgeon: Debby Dorn MATSU, MD;  Location: [facility] OR;  Service: Neurosurgery;  Laterality: Left;   ENTEROSCOPY N/A 03/05/2024   Procedure: ENTEROSCOPY;  Surgeon: Stacia Glendia BRAVO, MD;  Location: [facility]  ENDOSCOPY;  Service: Gastroenterology;  Laterality: N/A;   ESOPHAGOGASTRODUODENOSCOPY     around 2018 with [facility]   ESOPHAGOGASTRODUODENOSCOPY N/A 01/17/2024   Procedure: EGD (ESOPHAGOGASTRODUODENOSCOPY);  Surgeon: Wilhelmenia Aloha Raddle., MD;  Location: [facility]COPY;  Service: Gastroenterology;  Laterality: N/A;  FOOT CAPSULOTOMY Left 07/25/2008   Mid Foot #2 MPJ   Hammertoe Repair Left 07/25/2008   #2 toe   HAND SURGERY     nodule removed from left thumb and right index finger, Dr. Auston   HOT HEMOSTASIS N/A 03/05/2024   Procedure: EGD, WITH ARGON PLASMA COAGULATION;  Surgeon: Stacia Glendia BRAVO, MD;  Location: [facility] ENDOSCOPY;  Service: Gastroenterology;  Laterality: N/A;   INTRAOPERATIVE TRANSTHORACIC ECHOCARDIOGRAM N/A 01/26/2022   Procedure: INTRAOPERATIVE TRANSTHORACIC ECHOCARDIOGRAM;  Surgeon: Wendel Lurena POUR, MD;  Location: [facility] OR;  Service: Open Heart Surgery;  Laterality: N/A;   MOHS SURGERY  01/25/2023   right arm   RIGHT/LEFT HEART CATH AND CORONARY ANGIOGRAPHY N/A 12/11/2021   Procedure: RIGHT/LEFT HEART CATH AND CORONARY ANGIOGRAPHY;  Surgeon: Dann Candyce RAMAN, MD;  Location: [facility] INVASIVE CV LAB;  Service: Cardiovascular;  Laterality: N/A;   SCLEROTHERAPY  03/05/2024   Procedure: SCLEROTHERAPY;  Surgeon: Stacia Glendia BRAVO, MD;  Location: [facility] ENDOSCOPY;  Service: Gastroenterology;;   SPINAL FUSION  01/2021   TARSAL TUNNEL RELEASE Left 07/25/2008   TONSILLECTOMY     removed as a child   TRANSCATHETER AORTIC VALVE REPLACEMENT, TRANSFEMORAL N/A 01/26/2022   Procedure: Transcatheter Aortic Valve Replacement , Transfemoral;  Surgeon: Wendel Lurena POUR, MD;  Location: [facility] OR;  Service: Open Heart Surgery;  Laterality: N/A;  Percutaneous   UPPER GASTROINTESTINAL ENDOSCOPY  09/02/2020    Social History   Tobacco Use   Smoking status: Former    Current packs/day: 0.00    Average packs/day: 1 pack/day for 10.0 years (10.0 ttl pk-yrs)    Types: Cigarettes    Start  date: 1963    Quit date: 21    Years since quitting: 52.5    Passive exposure: Past   Smokeless tobacco: Never  Vaping Use   Vaping status: Never Used  Substance Use Topics   Alcohol use: Not Currently   Drug use: No    Family History  Problem Relation Age of Onset   Hyperlipidemia Mother    Colon polyps Mother    Hyperlipidemia Father    Alcoholism Brother    Healthy Son    Healthy Son    Cancer Neg Hx    Colon cancer Neg Hx    Esophageal cancer Neg Hx    Rectal cancer Neg Hx    Stomach cancer Neg Hx     Allergies  Allergen Reactions   Canagliflozin Other (See Comments)    Pt reports that the use of Invokana caused acute KIDNEY FAILURE ([facility] records) and allergic, per [facility]   Aspirin  Other (See Comments)    Gastroesophageal reflux and allergic, per MAR    Atorvastatin Other (See Comments)    Myalgias, GI upset, and allergic, per MAR   Statins Other (See Comments)    Muscle pain- allergic, per [facility]    Health Maintenance  Topic Date Due   Diabetic kidney evaluation - Urine ACR  Never done   OPHTHALMOLOGY EXAM  01/13/2024   INFLUENZA VACCINE  05/18/2024   Medicare Annual Wellness (AWV)  11/03/2024   Diabetic kidney evaluation - eGFR measurement  03/08/2025   Colonoscopy  09/03/2027   DTaP/Tdap/Td (2 - Tdap) 12/24/2028   Pneumococcal Vaccine: 50+ Years  Completed   Hepatitis B Vaccines  Aged Out   HPV VACCINES  Aged Out   Meningococcal B Vaccine  Aged Out   FOOT EXAM  Discontinued   HEMOGLOBIN A1C  Discontinued   COVID-19 Vaccine  Discontinued   Hepatitis  C Screening  Discontinued   Zoster Vaccines- Shingrix  Discontinued    Objective: BP (!) 152/71   Pulse 64   Temp 97.9 F (36.6 C) (Oral)    Physical Exam Constitutional:      Appearance: Normal appearance.  HENT:     Head: Normocephalic and atraumatic.      Mouth: Mucous membranes are moist.  Eyes:    Conjunctiva/sclera: Conjunctivae normal.     Pupils: Pupils are equal, round, and b/l  symmetrical    Cardiovascular:     Rate and Rhythm: Normal rate and regular rhythm.     Heart sounds: s1s2  Pulmonary:     Effort: Pulmonary effort is normal.     Breath sounds: Normal breath sounds.   Abdominal:     General: Non distended     Palpations: soft. Non tender  Musculoskeletal:        General: sitting in the wheel chair   Skin/ext: Bilateral 1+ edema; scabbings/excoriation in legs  Neurological:     General: grossly non focal     Mental Status: awake, alert and oriented to person, place, and time.   Psychiatric:        Mood and Affect: Mood normal.   Lab Results Lab Results  Component Value Date   WBC 9.4 03/08/2024   HGB 7.8 (L) 03/08/2024   HCT 24.4 (L) 03/08/2024   MCV 82.4 03/08/2024   PLT 234 03/08/2024    Lab Results  Component Value Date   CREATININE 2.34 (H) 03/08/2024   BUN 44 (H) 03/08/2024   NA 135 03/08/2024   K 3.5 03/08/2024   CL 106 03/08/2024   CO2 20 (L) 03/08/2024    Lab Results  Component Value Date   ALT 43 03/07/2024   AST 59 (H) 03/07/2024   ALKPHOS 117 03/07/2024   BILITOT 0.7 03/07/2024    Lab Results  Component Value Date   CHOL 161 06/15/2023   HDL 18.80 (L) 06/15/2023   LDLCALC 78 06/29/2022   LDLDIRECT 84.0 06/15/2023   TRIG (H) 06/15/2023    611.0 Triglyceride is over 400; calculations on Lipids are invalid.   CHOLHDL 9 06/15/2023   No results found for: LABRPR, RPRTITER No results found for: HIV1RNAQUANT, HIV1RNAVL, CD4TABS   Microbiology Results for orders placed or performed during the hospital encounter of 03/02/24  MRSA Next Gen by PCR, Nasal     Status: Abnormal   Collection Time: 03/03/24  2:32 AM   Specimen: Nasal Mucosa; Nasal Swab  Result Value Ref Range Status   MRSA by PCR Next Gen DETECTED (A) NOT DETECTED Final    Comment: RESULT CALLED TO, READ BACK BY AND VERIFIED WITH: W STANLEY RN 03/03/2024 @ 0500 BY AB (NOTE) The GeneXpert MRSA Assay (FDA approved for NASAL specimens  only), is one component of a comprehensive MRSA colonization surveillance program. It is not intended to diagnose MRSA infection nor to guide or monitor treatment for MRSA infections. Test performance is not FDA approved in patients less than 89 years old. Performed at [facility], [address], [place], [place] [address]   C Difficile Quick Screen w PCR reflex     Status: None   Collection Time: 03/03/24  1:14 PM   Specimen: STOOL  Result Value Ref Range Status   C Diff antigen NEGATIVE NEGATIVE Final   C Diff toxin NEGATIVE NEGATIVE Final   C Diff interpretation No C. difficile detected.  Final    Comment: Performed at  [facility], [address] [place], [place] [address]   Imaging CT ABDOMEN PELVIS WO CONTRAST Result Date: 04/15/2024 CLINICAL DATA:  Follow-up liver abscess. EXAM: CT ABDOMEN AND PELVIS WITHOUT CONTRAST TECHNIQUE: Multidetector CT imaging of the abdomen and pelvis was performed following the standard protocol without IV contrast. RADIATION DOSE REDUCTION: This exam was performed according to the departmental dose-optimization program which includes automated exposure control, adjustment of the mA and/or kV according to patient size and/or use of iterative reconstruction technique. COMPARISON:  03/03/2024 FINDINGS: Lower chest: 6 mm pulmonary nodule in the superior segment of the left lower lobe on image 8/5, unchanged since recent exam. Prior TAVR again noted. Hepatobiliary: Subtle low-attenuation area in the inferior right hepatic lobe is again seen. This measures approximately 14 mm on image 35/2, mildly decreased in size from 2.2 cm previously. An additional subtle low-attenuation lesion is seen in the anterior right lobe on image 21/2 which measures 1.4 cm, without significant change compared to prior study. These lesions are not well characterized due to their small size and lack of IV contrast on this exam. Prior cholecystectomy. No evidence of biliary  obstruction. Pancreas: No mass or inflammatory process visualized on this unenhanced exam. Spleen:  Within normal limits in size. Adrenals/Urinary tract: No evidence of urolithiasis or hydronephrosis. Stable small left renal cyst. Unremarkable unopacified urinary bladder. Stomach/Bowel: Small hiatal hernia again seen. No evidence of obstruction, inflammatory process, or abnormal fluid collections. Diverticulosis is seen mainly involving the sigmoid colon, however there is no evidence of diverticulitis. Vascular/Lymphatic: No pathologically enlarged lymph nodes identified. No evidence of abdominal aortic aneurysm. Reproductive: No mass or other significant abnormality. Posterior spinal fixation rods again noted. Other:  None. Musculoskeletal:  No suspicious bone lesions identified. IMPRESSION: Mild decrease in subtle low-attenuation lesion in inferior right hepatic lobe. No significant change in subtle low-attenuation lesion in anterior right hepatic lobe. These are not well characterized on this unenhanced exam. Consider further evaluation with MRI of clinically warranted. Colonic diverticulosis, without radiographic evidence of diverticulitis. Small hiatal hernia. 6 mm indeterminate left lower lobe pulmonary nodule. Non-contrast chest CT at 6-12 months is recommended. If the nodule is stable at time of repeat CT, then future CT at 18-24 months (from today's scan) is considered optional for low-risk patients, but is recommended for high-risk patients. This recommendation follows the consensus statement: Guidelines for Management of Incidental Pulmonary Nodules Detected on CT Images: From the Fleischner Society 2017; Radiology 2017; 284:228-243. Electronically Signed   By: Norleen DELENA Kil M.D.   On: 04/15/2024 08:35    Assessment/Plan # Liver abscess  5/17 with improving size of liver abscess ( 2.2 cm, down from 2.5 cm)   Plan  - Restart PO ciprofloxacin  500mg  po bid + metronidazole  500mg  po bid until next  visit - CT abd/pelvis wo - 5/22 CBC and BMP reviewed, no labs today  - Fu in 2 weeks after repeat CT   # RA  - Enbrel  on hold  - on leflunomide  10 mg po daily   --------- 04/19/24 id clinic assessment Patient patient basically has had 3 months of abx He had 03/09/24 admission for gib -- enteroscopy showed esophageal varices and small avm. He was on blood thinner and gi oked with continued. However family had decided to keep off   I am not clear why there was varices. No ct mention cirrhosis; 2023 mri abdomen didn't show cirrhosis. Not a drinker. No hx viral hepatitis  As for the 2 hepatic lesions  which one is improving slowly and the other stabilized. I think these could be sterilized lesions now and we probably should stop antibiotics and see how he does  Appetite is very good  No f/c No abd pain No n/v Some loose stool here and there   -stop cipro /flagyl  -blood tests today, crp, sed rate, cbc, cmp -f/u 6 weeks to repeat evaluation/blood tests -will plan to follow up to 3 months off antibiotics  -if patient remains in nursing home can do video visit and ask nurisng home to do labs   Constance ONEIDA Passer, MD [facility] for Infectious Disease [facility] 04/19/2024, 4:24 PM

## 2024-04-20 LAB — SEDIMENTATION RATE: Sed Rate: 104 mm/h — ABNORMAL HIGH (ref 0–20)

## 2024-04-20 LAB — COMPLETE METABOLIC PANEL WITHOUT GFR
AG Ratio: 1.3 (calc) (ref 1.0–2.5)
ALT: 8 U/L — ABNORMAL LOW (ref 9–46)
AST: 15 U/L (ref 10–35)
Albumin: 3 g/dL — ABNORMAL LOW (ref 3.6–5.1)
Alkaline phosphatase (APISO): 53 U/L (ref 35–144)
BUN/Creatinine Ratio: 18 (calc) (ref 6–22)
BUN: 38 mg/dL — ABNORMAL HIGH (ref 7–25)
CO2: 21 mmol/L (ref 20–32)
Calcium: 7.9 mg/dL — ABNORMAL LOW (ref 8.6–10.3)
Chloride: 108 mmol/L (ref 98–110)
Creat: 2.13 mg/dL — ABNORMAL HIGH (ref 0.70–1.22)
Globulin: 2.4 g/dL (ref 1.9–3.7)
Glucose, Bld: 162 mg/dL — ABNORMAL HIGH (ref 65–99)
Potassium: 4.1 mmol/L (ref 3.5–5.3)
Sodium: 137 mmol/L (ref 135–146)
Total Bilirubin: 0.2 mg/dL (ref 0.2–1.2)
Total Protein: 5.4 g/dL — ABNORMAL LOW (ref 6.1–8.1)

## 2024-04-20 LAB — CBC
HCT: 23.4 % — ABNORMAL LOW (ref 38.5–50.0)
Hemoglobin: 7.2 g/dL — ABNORMAL LOW (ref 13.2–17.1)
MCH: 25.4 pg — ABNORMAL LOW (ref 27.0–33.0)
MCHC: 30.8 g/dL — ABNORMAL LOW (ref 32.0–36.0)
MCV: 82.7 fL (ref 80.0–100.0)
MPV: 10.1 fL (ref 7.5–12.5)
Platelets: 342 Thousand/uL (ref 140–400)
RBC: 2.83 Million/uL — ABNORMAL LOW (ref 4.20–5.80)
RDW: 14.9 % (ref 11.0–15.0)
WBC: 8.4 Thousand/uL (ref 3.8–10.8)

## 2024-04-20 LAB — C-REACTIVE PROTEIN: CRP: 21.4 mg/L — ABNORMAL HIGH (ref <8.0)

## 2024-04-23 DIAGNOSIS — K750 Abscess of liver: Secondary | ICD-10-CM | POA: Diagnosis not present

## 2024-04-23 DIAGNOSIS — M069 Rheumatoid arthritis, unspecified: Secondary | ICD-10-CM | POA: Diagnosis not present

## 2024-04-23 DIAGNOSIS — Q2730 Arteriovenous malformation, site unspecified: Secondary | ICD-10-CM | POA: Diagnosis not present

## 2024-04-23 DIAGNOSIS — L89602 Pressure ulcer of unspecified heel, stage 2: Secondary | ICD-10-CM | POA: Diagnosis not present

## 2024-04-23 DIAGNOSIS — D6859 Other primary thrombophilia: Secondary | ICD-10-CM | POA: Diagnosis not present

## 2024-04-23 DIAGNOSIS — N183 Chronic kidney disease, stage 3 unspecified: Secondary | ICD-10-CM | POA: Diagnosis not present

## 2024-04-23 DIAGNOSIS — Z8679 Personal history of other diseases of the circulatory system: Secondary | ICD-10-CM | POA: Diagnosis not present

## 2024-04-23 DIAGNOSIS — R600 Localized edema: Secondary | ICD-10-CM | POA: Diagnosis not present

## 2024-04-23 DIAGNOSIS — Z794 Long term (current) use of insulin: Secondary | ICD-10-CM | POA: Diagnosis not present

## 2024-04-23 DIAGNOSIS — E785 Hyperlipidemia, unspecified: Secondary | ICD-10-CM | POA: Diagnosis not present

## 2024-04-23 DIAGNOSIS — E1122 Type 2 diabetes mellitus with diabetic chronic kidney disease: Secondary | ICD-10-CM | POA: Diagnosis not present

## 2024-04-23 DIAGNOSIS — I509 Heart failure, unspecified: Secondary | ICD-10-CM | POA: Diagnosis not present

## 2024-04-24 DIAGNOSIS — R2689 Other abnormalities of gait and mobility: Secondary | ICD-10-CM | POA: Diagnosis not present

## 2024-04-24 DIAGNOSIS — K750 Abscess of liver: Secondary | ICD-10-CM | POA: Diagnosis not present

## 2024-04-24 DIAGNOSIS — M6281 Muscle weakness (generalized): Secondary | ICD-10-CM | POA: Diagnosis not present

## 2024-04-24 DIAGNOSIS — R2681 Unsteadiness on feet: Secondary | ICD-10-CM | POA: Diagnosis not present

## 2024-04-24 DIAGNOSIS — R41841 Cognitive communication deficit: Secondary | ICD-10-CM | POA: Diagnosis not present

## 2024-04-24 DIAGNOSIS — F0390 Unspecified dementia without behavioral disturbance: Secondary | ICD-10-CM | POA: Diagnosis not present

## 2024-04-24 DIAGNOSIS — R296 Repeated falls: Secondary | ICD-10-CM | POA: Diagnosis not present

## 2024-04-24 DIAGNOSIS — K922 Gastrointestinal hemorrhage, unspecified: Secondary | ICD-10-CM | POA: Diagnosis not present

## 2024-04-25 DIAGNOSIS — M6281 Muscle weakness (generalized): Secondary | ICD-10-CM | POA: Diagnosis not present

## 2024-04-25 DIAGNOSIS — R2689 Other abnormalities of gait and mobility: Secondary | ICD-10-CM | POA: Diagnosis not present

## 2024-04-25 DIAGNOSIS — F5104 Psychophysiologic insomnia: Secondary | ICD-10-CM | POA: Diagnosis not present

## 2024-04-25 DIAGNOSIS — F32A Depression, unspecified: Secondary | ICD-10-CM | POA: Diagnosis not present

## 2024-04-25 DIAGNOSIS — K922 Gastrointestinal hemorrhage, unspecified: Secondary | ICD-10-CM | POA: Diagnosis not present

## 2024-04-25 DIAGNOSIS — R2681 Unsteadiness on feet: Secondary | ICD-10-CM | POA: Diagnosis not present

## 2024-04-25 DIAGNOSIS — R296 Repeated falls: Secondary | ICD-10-CM | POA: Diagnosis not present

## 2024-04-25 DIAGNOSIS — R41841 Cognitive communication deficit: Secondary | ICD-10-CM | POA: Diagnosis not present

## 2024-04-25 DIAGNOSIS — K750 Abscess of liver: Secondary | ICD-10-CM | POA: Diagnosis not present

## 2024-04-25 DIAGNOSIS — F0390 Unspecified dementia without behavioral disturbance: Secondary | ICD-10-CM | POA: Diagnosis not present

## 2024-04-26 DIAGNOSIS — R2689 Other abnormalities of gait and mobility: Secondary | ICD-10-CM | POA: Diagnosis not present

## 2024-04-26 DIAGNOSIS — F0390 Unspecified dementia without behavioral disturbance: Secondary | ICD-10-CM | POA: Diagnosis not present

## 2024-04-26 DIAGNOSIS — R41841 Cognitive communication deficit: Secondary | ICD-10-CM | POA: Diagnosis not present

## 2024-04-26 DIAGNOSIS — K750 Abscess of liver: Secondary | ICD-10-CM | POA: Diagnosis not present

## 2024-04-26 DIAGNOSIS — K922 Gastrointestinal hemorrhage, unspecified: Secondary | ICD-10-CM | POA: Diagnosis not present

## 2024-04-26 DIAGNOSIS — M6281 Muscle weakness (generalized): Secondary | ICD-10-CM | POA: Diagnosis not present

## 2024-04-26 DIAGNOSIS — R296 Repeated falls: Secondary | ICD-10-CM | POA: Diagnosis not present

## 2024-04-26 DIAGNOSIS — R2681 Unsteadiness on feet: Secondary | ICD-10-CM | POA: Diagnosis not present

## 2024-04-27 DIAGNOSIS — K922 Gastrointestinal hemorrhage, unspecified: Secondary | ICD-10-CM | POA: Diagnosis not present

## 2024-04-27 DIAGNOSIS — K750 Abscess of liver: Secondary | ICD-10-CM | POA: Diagnosis not present

## 2024-04-27 DIAGNOSIS — R2689 Other abnormalities of gait and mobility: Secondary | ICD-10-CM | POA: Diagnosis not present

## 2024-04-27 DIAGNOSIS — F0390 Unspecified dementia without behavioral disturbance: Secondary | ICD-10-CM | POA: Diagnosis not present

## 2024-04-27 DIAGNOSIS — M6281 Muscle weakness (generalized): Secondary | ICD-10-CM | POA: Diagnosis not present

## 2024-04-27 DIAGNOSIS — R41841 Cognitive communication deficit: Secondary | ICD-10-CM | POA: Diagnosis not present

## 2024-04-27 DIAGNOSIS — R2681 Unsteadiness on feet: Secondary | ICD-10-CM | POA: Diagnosis not present

## 2024-04-27 DIAGNOSIS — R296 Repeated falls: Secondary | ICD-10-CM | POA: Diagnosis not present

## 2024-04-30 DIAGNOSIS — F0390 Unspecified dementia without behavioral disturbance: Secondary | ICD-10-CM | POA: Diagnosis not present

## 2024-04-30 DIAGNOSIS — R41841 Cognitive communication deficit: Secondary | ICD-10-CM | POA: Diagnosis not present

## 2024-04-30 DIAGNOSIS — M6281 Muscle weakness (generalized): Secondary | ICD-10-CM | POA: Diagnosis not present

## 2024-04-30 DIAGNOSIS — K750 Abscess of liver: Secondary | ICD-10-CM | POA: Diagnosis not present

## 2024-04-30 DIAGNOSIS — K922 Gastrointestinal hemorrhage, unspecified: Secondary | ICD-10-CM | POA: Diagnosis not present

## 2024-04-30 DIAGNOSIS — R2689 Other abnormalities of gait and mobility: Secondary | ICD-10-CM | POA: Diagnosis not present

## 2024-04-30 DIAGNOSIS — R296 Repeated falls: Secondary | ICD-10-CM | POA: Diagnosis not present

## 2024-04-30 DIAGNOSIS — R2681 Unsteadiness on feet: Secondary | ICD-10-CM | POA: Diagnosis not present

## 2024-05-01 DIAGNOSIS — K750 Abscess of liver: Secondary | ICD-10-CM | POA: Diagnosis not present

## 2024-05-01 DIAGNOSIS — F0390 Unspecified dementia without behavioral disturbance: Secondary | ICD-10-CM | POA: Diagnosis not present

## 2024-05-01 DIAGNOSIS — M6281 Muscle weakness (generalized): Secondary | ICD-10-CM | POA: Diagnosis not present

## 2024-05-01 DIAGNOSIS — R41841 Cognitive communication deficit: Secondary | ICD-10-CM | POA: Diagnosis not present

## 2024-05-01 DIAGNOSIS — R2681 Unsteadiness on feet: Secondary | ICD-10-CM | POA: Diagnosis not present

## 2024-05-01 DIAGNOSIS — R296 Repeated falls: Secondary | ICD-10-CM | POA: Diagnosis not present

## 2024-05-01 DIAGNOSIS — R2689 Other abnormalities of gait and mobility: Secondary | ICD-10-CM | POA: Diagnosis not present

## 2024-05-01 DIAGNOSIS — K922 Gastrointestinal hemorrhage, unspecified: Secondary | ICD-10-CM | POA: Diagnosis not present

## 2024-05-02 DIAGNOSIS — R296 Repeated falls: Secondary | ICD-10-CM | POA: Diagnosis not present

## 2024-05-02 DIAGNOSIS — K922 Gastrointestinal hemorrhage, unspecified: Secondary | ICD-10-CM | POA: Diagnosis not present

## 2024-05-02 DIAGNOSIS — R2681 Unsteadiness on feet: Secondary | ICD-10-CM | POA: Diagnosis not present

## 2024-05-02 DIAGNOSIS — R2689 Other abnormalities of gait and mobility: Secondary | ICD-10-CM | POA: Diagnosis not present

## 2024-05-02 DIAGNOSIS — M6281 Muscle weakness (generalized): Secondary | ICD-10-CM | POA: Diagnosis not present

## 2024-05-02 DIAGNOSIS — R41841 Cognitive communication deficit: Secondary | ICD-10-CM | POA: Diagnosis not present

## 2024-05-02 DIAGNOSIS — K750 Abscess of liver: Secondary | ICD-10-CM | POA: Diagnosis not present

## 2024-05-02 DIAGNOSIS — F0390 Unspecified dementia without behavioral disturbance: Secondary | ICD-10-CM | POA: Diagnosis not present

## 2024-05-03 DIAGNOSIS — R2689 Other abnormalities of gait and mobility: Secondary | ICD-10-CM | POA: Diagnosis not present

## 2024-05-03 DIAGNOSIS — R296 Repeated falls: Secondary | ICD-10-CM | POA: Diagnosis not present

## 2024-05-03 DIAGNOSIS — F0390 Unspecified dementia without behavioral disturbance: Secondary | ICD-10-CM | POA: Diagnosis not present

## 2024-05-03 DIAGNOSIS — R41841 Cognitive communication deficit: Secondary | ICD-10-CM | POA: Diagnosis not present

## 2024-05-03 DIAGNOSIS — K922 Gastrointestinal hemorrhage, unspecified: Secondary | ICD-10-CM | POA: Diagnosis not present

## 2024-05-03 DIAGNOSIS — R2681 Unsteadiness on feet: Secondary | ICD-10-CM | POA: Diagnosis not present

## 2024-05-03 DIAGNOSIS — M6281 Muscle weakness (generalized): Secondary | ICD-10-CM | POA: Diagnosis not present

## 2024-05-03 DIAGNOSIS — K750 Abscess of liver: Secondary | ICD-10-CM | POA: Diagnosis not present

## 2024-05-07 DIAGNOSIS — R41841 Cognitive communication deficit: Secondary | ICD-10-CM | POA: Diagnosis not present

## 2024-05-07 DIAGNOSIS — F0390 Unspecified dementia without behavioral disturbance: Secondary | ICD-10-CM | POA: Diagnosis not present

## 2024-05-07 DIAGNOSIS — K750 Abscess of liver: Secondary | ICD-10-CM | POA: Diagnosis not present

## 2024-05-07 DIAGNOSIS — K922 Gastrointestinal hemorrhage, unspecified: Secondary | ICD-10-CM | POA: Diagnosis not present

## 2024-05-07 DIAGNOSIS — M6281 Muscle weakness (generalized): Secondary | ICD-10-CM | POA: Diagnosis not present

## 2024-05-07 DIAGNOSIS — R2681 Unsteadiness on feet: Secondary | ICD-10-CM | POA: Diagnosis not present

## 2024-05-07 DIAGNOSIS — R296 Repeated falls: Secondary | ICD-10-CM | POA: Diagnosis not present

## 2024-05-07 DIAGNOSIS — R2689 Other abnormalities of gait and mobility: Secondary | ICD-10-CM | POA: Diagnosis not present

## 2024-05-08 DIAGNOSIS — R2689 Other abnormalities of gait and mobility: Secondary | ICD-10-CM | POA: Diagnosis not present

## 2024-05-08 DIAGNOSIS — K922 Gastrointestinal hemorrhage, unspecified: Secondary | ICD-10-CM | POA: Diagnosis not present

## 2024-05-08 DIAGNOSIS — M6281 Muscle weakness (generalized): Secondary | ICD-10-CM | POA: Diagnosis not present

## 2024-05-08 DIAGNOSIS — F0390 Unspecified dementia without behavioral disturbance: Secondary | ICD-10-CM | POA: Diagnosis not present

## 2024-05-08 DIAGNOSIS — R41841 Cognitive communication deficit: Secondary | ICD-10-CM | POA: Diagnosis not present

## 2024-05-08 DIAGNOSIS — R2681 Unsteadiness on feet: Secondary | ICD-10-CM | POA: Diagnosis not present

## 2024-05-08 DIAGNOSIS — R296 Repeated falls: Secondary | ICD-10-CM | POA: Diagnosis not present

## 2024-05-08 DIAGNOSIS — K750 Abscess of liver: Secondary | ICD-10-CM | POA: Diagnosis not present

## 2024-05-09 DIAGNOSIS — R2689 Other abnormalities of gait and mobility: Secondary | ICD-10-CM | POA: Diagnosis not present

## 2024-05-09 DIAGNOSIS — R41841 Cognitive communication deficit: Secondary | ICD-10-CM | POA: Diagnosis not present

## 2024-05-09 DIAGNOSIS — K922 Gastrointestinal hemorrhage, unspecified: Secondary | ICD-10-CM | POA: Diagnosis not present

## 2024-05-09 DIAGNOSIS — K750 Abscess of liver: Secondary | ICD-10-CM | POA: Diagnosis not present

## 2024-05-09 DIAGNOSIS — F5104 Psychophysiologic insomnia: Secondary | ICD-10-CM | POA: Diagnosis not present

## 2024-05-09 DIAGNOSIS — M6281 Muscle weakness (generalized): Secondary | ICD-10-CM | POA: Diagnosis not present

## 2024-05-09 DIAGNOSIS — R2681 Unsteadiness on feet: Secondary | ICD-10-CM | POA: Diagnosis not present

## 2024-05-09 DIAGNOSIS — F0390 Unspecified dementia without behavioral disturbance: Secondary | ICD-10-CM | POA: Diagnosis not present

## 2024-05-09 DIAGNOSIS — R296 Repeated falls: Secondary | ICD-10-CM | POA: Diagnosis not present

## 2024-05-09 DIAGNOSIS — F32A Depression, unspecified: Secondary | ICD-10-CM | POA: Diagnosis not present

## 2024-05-10 DIAGNOSIS — M6281 Muscle weakness (generalized): Secondary | ICD-10-CM | POA: Diagnosis not present

## 2024-05-10 DIAGNOSIS — F0390 Unspecified dementia without behavioral disturbance: Secondary | ICD-10-CM | POA: Diagnosis not present

## 2024-05-10 DIAGNOSIS — R296 Repeated falls: Secondary | ICD-10-CM | POA: Diagnosis not present

## 2024-05-10 DIAGNOSIS — R2681 Unsteadiness on feet: Secondary | ICD-10-CM | POA: Diagnosis not present

## 2024-05-10 DIAGNOSIS — R41841 Cognitive communication deficit: Secondary | ICD-10-CM | POA: Diagnosis not present

## 2024-05-10 DIAGNOSIS — K922 Gastrointestinal hemorrhage, unspecified: Secondary | ICD-10-CM | POA: Diagnosis not present

## 2024-05-10 DIAGNOSIS — K750 Abscess of liver: Secondary | ICD-10-CM | POA: Diagnosis not present

## 2024-05-10 DIAGNOSIS — R2689 Other abnormalities of gait and mobility: Secondary | ICD-10-CM | POA: Diagnosis not present

## 2024-05-14 DIAGNOSIS — R2681 Unsteadiness on feet: Secondary | ICD-10-CM | POA: Diagnosis not present

## 2024-05-14 DIAGNOSIS — R2689 Other abnormalities of gait and mobility: Secondary | ICD-10-CM | POA: Diagnosis not present

## 2024-05-14 DIAGNOSIS — R41841 Cognitive communication deficit: Secondary | ICD-10-CM | POA: Diagnosis not present

## 2024-05-14 DIAGNOSIS — M6281 Muscle weakness (generalized): Secondary | ICD-10-CM | POA: Diagnosis not present

## 2024-05-14 DIAGNOSIS — F0390 Unspecified dementia without behavioral disturbance: Secondary | ICD-10-CM | POA: Diagnosis not present

## 2024-05-14 DIAGNOSIS — K750 Abscess of liver: Secondary | ICD-10-CM | POA: Diagnosis not present

## 2024-05-14 DIAGNOSIS — K922 Gastrointestinal hemorrhage, unspecified: Secondary | ICD-10-CM | POA: Diagnosis not present

## 2024-05-14 DIAGNOSIS — R296 Repeated falls: Secondary | ICD-10-CM | POA: Diagnosis not present

## 2024-05-15 DIAGNOSIS — R2689 Other abnormalities of gait and mobility: Secondary | ICD-10-CM | POA: Diagnosis not present

## 2024-05-15 DIAGNOSIS — K750 Abscess of liver: Secondary | ICD-10-CM | POA: Diagnosis not present

## 2024-05-15 DIAGNOSIS — R2681 Unsteadiness on feet: Secondary | ICD-10-CM | POA: Diagnosis not present

## 2024-05-15 DIAGNOSIS — R41841 Cognitive communication deficit: Secondary | ICD-10-CM | POA: Diagnosis not present

## 2024-05-15 DIAGNOSIS — M6281 Muscle weakness (generalized): Secondary | ICD-10-CM | POA: Diagnosis not present

## 2024-05-15 DIAGNOSIS — K922 Gastrointestinal hemorrhage, unspecified: Secondary | ICD-10-CM | POA: Diagnosis not present

## 2024-05-15 DIAGNOSIS — F0390 Unspecified dementia without behavioral disturbance: Secondary | ICD-10-CM | POA: Diagnosis not present

## 2024-05-15 DIAGNOSIS — R296 Repeated falls: Secondary | ICD-10-CM | POA: Diagnosis not present

## 2024-05-16 DIAGNOSIS — R2681 Unsteadiness on feet: Secondary | ICD-10-CM | POA: Diagnosis not present

## 2024-05-16 DIAGNOSIS — K922 Gastrointestinal hemorrhage, unspecified: Secondary | ICD-10-CM | POA: Diagnosis not present

## 2024-05-16 DIAGNOSIS — R2689 Other abnormalities of gait and mobility: Secondary | ICD-10-CM | POA: Diagnosis not present

## 2024-05-16 DIAGNOSIS — R296 Repeated falls: Secondary | ICD-10-CM | POA: Diagnosis not present

## 2024-05-16 DIAGNOSIS — M6281 Muscle weakness (generalized): Secondary | ICD-10-CM | POA: Diagnosis not present

## 2024-05-16 DIAGNOSIS — F0390 Unspecified dementia without behavioral disturbance: Secondary | ICD-10-CM | POA: Diagnosis not present

## 2024-05-16 DIAGNOSIS — K750 Abscess of liver: Secondary | ICD-10-CM | POA: Diagnosis not present

## 2024-05-16 DIAGNOSIS — R41841 Cognitive communication deficit: Secondary | ICD-10-CM | POA: Diagnosis not present

## 2024-05-17 DIAGNOSIS — R2689 Other abnormalities of gait and mobility: Secondary | ICD-10-CM | POA: Diagnosis not present

## 2024-05-17 DIAGNOSIS — M6281 Muscle weakness (generalized): Secondary | ICD-10-CM | POA: Diagnosis not present

## 2024-05-17 DIAGNOSIS — K750 Abscess of liver: Secondary | ICD-10-CM | POA: Diagnosis not present

## 2024-05-17 DIAGNOSIS — F0390 Unspecified dementia without behavioral disturbance: Secondary | ICD-10-CM | POA: Diagnosis not present

## 2024-05-17 DIAGNOSIS — K922 Gastrointestinal hemorrhage, unspecified: Secondary | ICD-10-CM | POA: Diagnosis not present

## 2024-05-17 DIAGNOSIS — R2681 Unsteadiness on feet: Secondary | ICD-10-CM | POA: Diagnosis not present

## 2024-05-17 DIAGNOSIS — R41841 Cognitive communication deficit: Secondary | ICD-10-CM | POA: Diagnosis not present

## 2024-05-17 DIAGNOSIS — R296 Repeated falls: Secondary | ICD-10-CM | POA: Diagnosis not present

## 2024-05-18 DIAGNOSIS — M6281 Muscle weakness (generalized): Secondary | ICD-10-CM | POA: Diagnosis not present

## 2024-05-18 DIAGNOSIS — K750 Abscess of liver: Secondary | ICD-10-CM | POA: Diagnosis not present

## 2024-05-18 DIAGNOSIS — R2681 Unsteadiness on feet: Secondary | ICD-10-CM | POA: Diagnosis not present

## 2024-05-18 DIAGNOSIS — F0390 Unspecified dementia without behavioral disturbance: Secondary | ICD-10-CM | POA: Diagnosis not present

## 2024-05-18 DIAGNOSIS — K922 Gastrointestinal hemorrhage, unspecified: Secondary | ICD-10-CM | POA: Diagnosis not present

## 2024-05-18 DIAGNOSIS — R296 Repeated falls: Secondary | ICD-10-CM | POA: Diagnosis not present

## 2024-05-18 DIAGNOSIS — R2689 Other abnormalities of gait and mobility: Secondary | ICD-10-CM | POA: Diagnosis not present

## 2024-05-18 DIAGNOSIS — R41841 Cognitive communication deficit: Secondary | ICD-10-CM | POA: Diagnosis not present

## 2024-05-20 ENCOUNTER — Other Ambulatory Visit: Payer: Self-pay | Admitting: Family Medicine

## 2024-05-21 DIAGNOSIS — E785 Hyperlipidemia, unspecified: Secondary | ICD-10-CM | POA: Diagnosis not present

## 2024-05-21 DIAGNOSIS — R41841 Cognitive communication deficit: Secondary | ICD-10-CM | POA: Diagnosis not present

## 2024-05-21 DIAGNOSIS — F0390 Unspecified dementia without behavioral disturbance: Secondary | ICD-10-CM | POA: Diagnosis not present

## 2024-05-21 DIAGNOSIS — Z794 Long term (current) use of insulin: Secondary | ICD-10-CM | POA: Diagnosis not present

## 2024-05-21 DIAGNOSIS — R296 Repeated falls: Secondary | ICD-10-CM | POA: Diagnosis not present

## 2024-05-21 DIAGNOSIS — R2681 Unsteadiness on feet: Secondary | ICD-10-CM | POA: Diagnosis not present

## 2024-05-21 DIAGNOSIS — M6281 Muscle weakness (generalized): Secondary | ICD-10-CM | POA: Diagnosis not present

## 2024-05-21 DIAGNOSIS — N183 Chronic kidney disease, stage 3 unspecified: Secondary | ICD-10-CM | POA: Diagnosis not present

## 2024-05-21 DIAGNOSIS — Q2730 Arteriovenous malformation, site unspecified: Secondary | ICD-10-CM | POA: Diagnosis not present

## 2024-05-21 DIAGNOSIS — D6859 Other primary thrombophilia: Secondary | ICD-10-CM | POA: Diagnosis not present

## 2024-05-21 DIAGNOSIS — L89602 Pressure ulcer of unspecified heel, stage 2: Secondary | ICD-10-CM | POA: Diagnosis not present

## 2024-05-21 DIAGNOSIS — K750 Abscess of liver: Secondary | ICD-10-CM | POA: Diagnosis not present

## 2024-05-21 DIAGNOSIS — R600 Localized edema: Secondary | ICD-10-CM | POA: Diagnosis not present

## 2024-05-21 DIAGNOSIS — M069 Rheumatoid arthritis, unspecified: Secondary | ICD-10-CM | POA: Diagnosis not present

## 2024-05-21 DIAGNOSIS — Z8679 Personal history of other diseases of the circulatory system: Secondary | ICD-10-CM | POA: Diagnosis not present

## 2024-05-21 DIAGNOSIS — E1122 Type 2 diabetes mellitus with diabetic chronic kidney disease: Secondary | ICD-10-CM | POA: Diagnosis not present

## 2024-05-21 DIAGNOSIS — I509 Heart failure, unspecified: Secondary | ICD-10-CM | POA: Diagnosis not present

## 2024-05-21 DIAGNOSIS — R2689 Other abnormalities of gait and mobility: Secondary | ICD-10-CM | POA: Diagnosis not present

## 2024-05-21 DIAGNOSIS — K922 Gastrointestinal hemorrhage, unspecified: Secondary | ICD-10-CM | POA: Diagnosis not present

## 2024-05-22 DIAGNOSIS — F0390 Unspecified dementia without behavioral disturbance: Secondary | ICD-10-CM | POA: Diagnosis not present

## 2024-05-22 DIAGNOSIS — R2689 Other abnormalities of gait and mobility: Secondary | ICD-10-CM | POA: Diagnosis not present

## 2024-05-22 DIAGNOSIS — R41841 Cognitive communication deficit: Secondary | ICD-10-CM | POA: Diagnosis not present

## 2024-05-22 DIAGNOSIS — M6281 Muscle weakness (generalized): Secondary | ICD-10-CM | POA: Diagnosis not present

## 2024-05-22 DIAGNOSIS — K750 Abscess of liver: Secondary | ICD-10-CM | POA: Diagnosis not present

## 2024-05-22 DIAGNOSIS — K922 Gastrointestinal hemorrhage, unspecified: Secondary | ICD-10-CM | POA: Diagnosis not present

## 2024-05-22 DIAGNOSIS — R2681 Unsteadiness on feet: Secondary | ICD-10-CM | POA: Diagnosis not present

## 2024-05-22 DIAGNOSIS — R296 Repeated falls: Secondary | ICD-10-CM | POA: Diagnosis not present

## 2024-05-23 DIAGNOSIS — K750 Abscess of liver: Secondary | ICD-10-CM | POA: Diagnosis not present

## 2024-05-23 DIAGNOSIS — R2689 Other abnormalities of gait and mobility: Secondary | ICD-10-CM | POA: Diagnosis not present

## 2024-05-23 DIAGNOSIS — K922 Gastrointestinal hemorrhage, unspecified: Secondary | ICD-10-CM | POA: Diagnosis not present

## 2024-05-23 DIAGNOSIS — R41841 Cognitive communication deficit: Secondary | ICD-10-CM | POA: Diagnosis not present

## 2024-05-23 DIAGNOSIS — R2681 Unsteadiness on feet: Secondary | ICD-10-CM | POA: Diagnosis not present

## 2024-05-23 DIAGNOSIS — F0390 Unspecified dementia without behavioral disturbance: Secondary | ICD-10-CM | POA: Diagnosis not present

## 2024-05-23 DIAGNOSIS — R296 Repeated falls: Secondary | ICD-10-CM | POA: Diagnosis not present

## 2024-05-23 DIAGNOSIS — M6281 Muscle weakness (generalized): Secondary | ICD-10-CM | POA: Diagnosis not present

## 2024-05-24 DIAGNOSIS — K750 Abscess of liver: Secondary | ICD-10-CM | POA: Diagnosis not present

## 2024-05-24 DIAGNOSIS — R2681 Unsteadiness on feet: Secondary | ICD-10-CM | POA: Diagnosis not present

## 2024-05-24 DIAGNOSIS — K922 Gastrointestinal hemorrhage, unspecified: Secondary | ICD-10-CM | POA: Diagnosis not present

## 2024-05-24 DIAGNOSIS — R2689 Other abnormalities of gait and mobility: Secondary | ICD-10-CM | POA: Diagnosis not present

## 2024-05-24 DIAGNOSIS — F0390 Unspecified dementia without behavioral disturbance: Secondary | ICD-10-CM | POA: Diagnosis not present

## 2024-05-24 DIAGNOSIS — M6281 Muscle weakness (generalized): Secondary | ICD-10-CM | POA: Diagnosis not present

## 2024-05-24 DIAGNOSIS — R41841 Cognitive communication deficit: Secondary | ICD-10-CM | POA: Diagnosis not present

## 2024-05-24 DIAGNOSIS — R296 Repeated falls: Secondary | ICD-10-CM | POA: Diagnosis not present

## 2024-05-28 DIAGNOSIS — M6281 Muscle weakness (generalized): Secondary | ICD-10-CM | POA: Diagnosis not present

## 2024-05-28 DIAGNOSIS — R2681 Unsteadiness on feet: Secondary | ICD-10-CM | POA: Diagnosis not present

## 2024-05-28 DIAGNOSIS — K922 Gastrointestinal hemorrhage, unspecified: Secondary | ICD-10-CM | POA: Diagnosis not present

## 2024-05-28 DIAGNOSIS — R296 Repeated falls: Secondary | ICD-10-CM | POA: Diagnosis not present

## 2024-05-28 DIAGNOSIS — R41841 Cognitive communication deficit: Secondary | ICD-10-CM | POA: Diagnosis not present

## 2024-05-28 DIAGNOSIS — F0390 Unspecified dementia without behavioral disturbance: Secondary | ICD-10-CM | POA: Diagnosis not present

## 2024-05-28 DIAGNOSIS — R2689 Other abnormalities of gait and mobility: Secondary | ICD-10-CM | POA: Diagnosis not present

## 2024-05-28 DIAGNOSIS — K750 Abscess of liver: Secondary | ICD-10-CM | POA: Diagnosis not present

## 2024-05-29 DIAGNOSIS — K922 Gastrointestinal hemorrhage, unspecified: Secondary | ICD-10-CM | POA: Diagnosis not present

## 2024-05-29 DIAGNOSIS — R296 Repeated falls: Secondary | ICD-10-CM | POA: Diagnosis not present

## 2024-05-29 DIAGNOSIS — F0390 Unspecified dementia without behavioral disturbance: Secondary | ICD-10-CM | POA: Diagnosis not present

## 2024-05-29 DIAGNOSIS — M6281 Muscle weakness (generalized): Secondary | ICD-10-CM | POA: Diagnosis not present

## 2024-05-29 DIAGNOSIS — R2681 Unsteadiness on feet: Secondary | ICD-10-CM | POA: Diagnosis not present

## 2024-05-29 DIAGNOSIS — R41841 Cognitive communication deficit: Secondary | ICD-10-CM | POA: Diagnosis not present

## 2024-05-29 DIAGNOSIS — R2689 Other abnormalities of gait and mobility: Secondary | ICD-10-CM | POA: Diagnosis not present

## 2024-05-29 DIAGNOSIS — K750 Abscess of liver: Secondary | ICD-10-CM | POA: Diagnosis not present

## 2024-05-30 DIAGNOSIS — F0390 Unspecified dementia without behavioral disturbance: Secondary | ICD-10-CM | POA: Diagnosis not present

## 2024-05-30 DIAGNOSIS — R2681 Unsteadiness on feet: Secondary | ICD-10-CM | POA: Diagnosis not present

## 2024-05-30 DIAGNOSIS — R296 Repeated falls: Secondary | ICD-10-CM | POA: Diagnosis not present

## 2024-05-30 DIAGNOSIS — M6281 Muscle weakness (generalized): Secondary | ICD-10-CM | POA: Diagnosis not present

## 2024-05-30 DIAGNOSIS — K750 Abscess of liver: Secondary | ICD-10-CM | POA: Diagnosis not present

## 2024-05-30 DIAGNOSIS — K922 Gastrointestinal hemorrhage, unspecified: Secondary | ICD-10-CM | POA: Diagnosis not present

## 2024-05-30 DIAGNOSIS — R2689 Other abnormalities of gait and mobility: Secondary | ICD-10-CM | POA: Diagnosis not present

## 2024-05-30 DIAGNOSIS — R41841 Cognitive communication deficit: Secondary | ICD-10-CM | POA: Diagnosis not present

## 2024-05-31 DIAGNOSIS — F0390 Unspecified dementia without behavioral disturbance: Secondary | ICD-10-CM | POA: Diagnosis not present

## 2024-05-31 DIAGNOSIS — R41841 Cognitive communication deficit: Secondary | ICD-10-CM | POA: Diagnosis not present

## 2024-05-31 DIAGNOSIS — R296 Repeated falls: Secondary | ICD-10-CM | POA: Diagnosis not present

## 2024-05-31 DIAGNOSIS — M6281 Muscle weakness (generalized): Secondary | ICD-10-CM | POA: Diagnosis not present

## 2024-05-31 DIAGNOSIS — R2681 Unsteadiness on feet: Secondary | ICD-10-CM | POA: Diagnosis not present

## 2024-05-31 DIAGNOSIS — K922 Gastrointestinal hemorrhage, unspecified: Secondary | ICD-10-CM | POA: Diagnosis not present

## 2024-05-31 DIAGNOSIS — R2689 Other abnormalities of gait and mobility: Secondary | ICD-10-CM | POA: Diagnosis not present

## 2024-05-31 DIAGNOSIS — K750 Abscess of liver: Secondary | ICD-10-CM | POA: Diagnosis not present

## 2024-06-04 DIAGNOSIS — F0390 Unspecified dementia without behavioral disturbance: Secondary | ICD-10-CM | POA: Diagnosis not present

## 2024-06-04 DIAGNOSIS — K922 Gastrointestinal hemorrhage, unspecified: Secondary | ICD-10-CM | POA: Diagnosis not present

## 2024-06-04 DIAGNOSIS — K750 Abscess of liver: Secondary | ICD-10-CM | POA: Diagnosis not present

## 2024-06-04 DIAGNOSIS — R296 Repeated falls: Secondary | ICD-10-CM | POA: Diagnosis not present

## 2024-06-04 DIAGNOSIS — R2689 Other abnormalities of gait and mobility: Secondary | ICD-10-CM | POA: Diagnosis not present

## 2024-06-04 DIAGNOSIS — R2681 Unsteadiness on feet: Secondary | ICD-10-CM | POA: Diagnosis not present

## 2024-06-04 DIAGNOSIS — R41841 Cognitive communication deficit: Secondary | ICD-10-CM | POA: Diagnosis not present

## 2024-06-04 DIAGNOSIS — M6281 Muscle weakness (generalized): Secondary | ICD-10-CM | POA: Diagnosis not present

## 2024-06-05 DIAGNOSIS — K922 Gastrointestinal hemorrhage, unspecified: Secondary | ICD-10-CM | POA: Diagnosis not present

## 2024-06-05 DIAGNOSIS — R2689 Other abnormalities of gait and mobility: Secondary | ICD-10-CM | POA: Diagnosis not present

## 2024-06-05 DIAGNOSIS — R2681 Unsteadiness on feet: Secondary | ICD-10-CM | POA: Diagnosis not present

## 2024-06-05 DIAGNOSIS — M6281 Muscle weakness (generalized): Secondary | ICD-10-CM | POA: Diagnosis not present

## 2024-06-05 DIAGNOSIS — F0390 Unspecified dementia without behavioral disturbance: Secondary | ICD-10-CM | POA: Diagnosis not present

## 2024-06-05 DIAGNOSIS — R41841 Cognitive communication deficit: Secondary | ICD-10-CM | POA: Diagnosis not present

## 2024-06-05 DIAGNOSIS — K750 Abscess of liver: Secondary | ICD-10-CM | POA: Diagnosis not present

## 2024-06-05 DIAGNOSIS — R296 Repeated falls: Secondary | ICD-10-CM | POA: Diagnosis not present

## 2024-06-06 DIAGNOSIS — K750 Abscess of liver: Secondary | ICD-10-CM | POA: Diagnosis not present

## 2024-06-06 DIAGNOSIS — R2689 Other abnormalities of gait and mobility: Secondary | ICD-10-CM | POA: Diagnosis not present

## 2024-06-06 DIAGNOSIS — R2681 Unsteadiness on feet: Secondary | ICD-10-CM | POA: Diagnosis not present

## 2024-06-06 DIAGNOSIS — M6281 Muscle weakness (generalized): Secondary | ICD-10-CM | POA: Diagnosis not present

## 2024-06-06 DIAGNOSIS — K922 Gastrointestinal hemorrhage, unspecified: Secondary | ICD-10-CM | POA: Diagnosis not present

## 2024-06-06 DIAGNOSIS — R41841 Cognitive communication deficit: Secondary | ICD-10-CM | POA: Diagnosis not present

## 2024-06-06 DIAGNOSIS — F0390 Unspecified dementia without behavioral disturbance: Secondary | ICD-10-CM | POA: Diagnosis not present

## 2024-06-06 DIAGNOSIS — R296 Repeated falls: Secondary | ICD-10-CM | POA: Diagnosis not present

## 2024-06-07 DIAGNOSIS — R296 Repeated falls: Secondary | ICD-10-CM | POA: Diagnosis not present

## 2024-06-07 DIAGNOSIS — R2681 Unsteadiness on feet: Secondary | ICD-10-CM | POA: Diagnosis not present

## 2024-06-07 DIAGNOSIS — F0390 Unspecified dementia without behavioral disturbance: Secondary | ICD-10-CM | POA: Diagnosis not present

## 2024-06-07 DIAGNOSIS — M6281 Muscle weakness (generalized): Secondary | ICD-10-CM | POA: Diagnosis not present

## 2024-06-07 DIAGNOSIS — K922 Gastrointestinal hemorrhage, unspecified: Secondary | ICD-10-CM | POA: Diagnosis not present

## 2024-06-07 DIAGNOSIS — K750 Abscess of liver: Secondary | ICD-10-CM | POA: Diagnosis not present

## 2024-06-07 DIAGNOSIS — R41841 Cognitive communication deficit: Secondary | ICD-10-CM | POA: Diagnosis not present

## 2024-06-07 DIAGNOSIS — R2689 Other abnormalities of gait and mobility: Secondary | ICD-10-CM | POA: Diagnosis not present

## 2024-06-08 DIAGNOSIS — R2681 Unsteadiness on feet: Secondary | ICD-10-CM | POA: Diagnosis not present

## 2024-06-08 DIAGNOSIS — M6281 Muscle weakness (generalized): Secondary | ICD-10-CM | POA: Diagnosis not present

## 2024-06-08 DIAGNOSIS — R2689 Other abnormalities of gait and mobility: Secondary | ICD-10-CM | POA: Diagnosis not present

## 2024-06-08 DIAGNOSIS — K922 Gastrointestinal hemorrhage, unspecified: Secondary | ICD-10-CM | POA: Diagnosis not present

## 2024-06-08 DIAGNOSIS — K750 Abscess of liver: Secondary | ICD-10-CM | POA: Diagnosis not present

## 2024-06-08 DIAGNOSIS — R296 Repeated falls: Secondary | ICD-10-CM | POA: Diagnosis not present

## 2024-06-08 DIAGNOSIS — F0390 Unspecified dementia without behavioral disturbance: Secondary | ICD-10-CM | POA: Diagnosis not present

## 2024-06-08 DIAGNOSIS — R41841 Cognitive communication deficit: Secondary | ICD-10-CM | POA: Diagnosis not present

## 2024-06-11 DIAGNOSIS — F0390 Unspecified dementia without behavioral disturbance: Secondary | ICD-10-CM | POA: Diagnosis not present

## 2024-06-11 DIAGNOSIS — R2689 Other abnormalities of gait and mobility: Secondary | ICD-10-CM | POA: Diagnosis not present

## 2024-06-11 DIAGNOSIS — R41841 Cognitive communication deficit: Secondary | ICD-10-CM | POA: Diagnosis not present

## 2024-06-11 DIAGNOSIS — R296 Repeated falls: Secondary | ICD-10-CM | POA: Diagnosis not present

## 2024-06-11 DIAGNOSIS — K922 Gastrointestinal hemorrhage, unspecified: Secondary | ICD-10-CM | POA: Diagnosis not present

## 2024-06-11 DIAGNOSIS — R2681 Unsteadiness on feet: Secondary | ICD-10-CM | POA: Diagnosis not present

## 2024-06-11 DIAGNOSIS — M6281 Muscle weakness (generalized): Secondary | ICD-10-CM | POA: Diagnosis not present

## 2024-06-11 DIAGNOSIS — K750 Abscess of liver: Secondary | ICD-10-CM | POA: Diagnosis not present

## 2024-06-13 DIAGNOSIS — R2681 Unsteadiness on feet: Secondary | ICD-10-CM | POA: Diagnosis not present

## 2024-06-13 DIAGNOSIS — K922 Gastrointestinal hemorrhage, unspecified: Secondary | ICD-10-CM | POA: Diagnosis not present

## 2024-06-13 DIAGNOSIS — M6281 Muscle weakness (generalized): Secondary | ICD-10-CM | POA: Diagnosis not present

## 2024-06-13 DIAGNOSIS — K750 Abscess of liver: Secondary | ICD-10-CM | POA: Diagnosis not present

## 2024-06-13 DIAGNOSIS — R2689 Other abnormalities of gait and mobility: Secondary | ICD-10-CM | POA: Diagnosis not present

## 2024-06-13 DIAGNOSIS — F0390 Unspecified dementia without behavioral disturbance: Secondary | ICD-10-CM | POA: Diagnosis not present

## 2024-06-13 DIAGNOSIS — R296 Repeated falls: Secondary | ICD-10-CM | POA: Diagnosis not present

## 2024-06-13 DIAGNOSIS — R41841 Cognitive communication deficit: Secondary | ICD-10-CM | POA: Diagnosis not present

## 2024-06-14 DIAGNOSIS — R2689 Other abnormalities of gait and mobility: Secondary | ICD-10-CM | POA: Diagnosis not present

## 2024-06-14 DIAGNOSIS — K922 Gastrointestinal hemorrhage, unspecified: Secondary | ICD-10-CM | POA: Diagnosis not present

## 2024-06-14 DIAGNOSIS — R2681 Unsteadiness on feet: Secondary | ICD-10-CM | POA: Diagnosis not present

## 2024-06-14 DIAGNOSIS — K750 Abscess of liver: Secondary | ICD-10-CM | POA: Diagnosis not present

## 2024-06-14 DIAGNOSIS — R296 Repeated falls: Secondary | ICD-10-CM | POA: Diagnosis not present

## 2024-06-14 DIAGNOSIS — M6281 Muscle weakness (generalized): Secondary | ICD-10-CM | POA: Diagnosis not present

## 2024-06-14 DIAGNOSIS — R41841 Cognitive communication deficit: Secondary | ICD-10-CM | POA: Diagnosis not present

## 2024-06-14 DIAGNOSIS — F0390 Unspecified dementia without behavioral disturbance: Secondary | ICD-10-CM | POA: Diagnosis not present

## 2024-06-19 DIAGNOSIS — K750 Abscess of liver: Secondary | ICD-10-CM | POA: Diagnosis not present

## 2024-06-19 DIAGNOSIS — M6281 Muscle weakness (generalized): Secondary | ICD-10-CM | POA: Diagnosis not present

## 2024-06-19 DIAGNOSIS — R296 Repeated falls: Secondary | ICD-10-CM | POA: Diagnosis not present

## 2024-06-19 DIAGNOSIS — R41841 Cognitive communication deficit: Secondary | ICD-10-CM | POA: Diagnosis not present

## 2024-06-19 DIAGNOSIS — F0390 Unspecified dementia without behavioral disturbance: Secondary | ICD-10-CM | POA: Diagnosis not present

## 2024-06-19 DIAGNOSIS — R2689 Other abnormalities of gait and mobility: Secondary | ICD-10-CM | POA: Diagnosis not present

## 2024-06-19 DIAGNOSIS — K922 Gastrointestinal hemorrhage, unspecified: Secondary | ICD-10-CM | POA: Diagnosis not present

## 2024-06-20 DIAGNOSIS — F0390 Unspecified dementia without behavioral disturbance: Secondary | ICD-10-CM | POA: Diagnosis not present

## 2024-06-20 DIAGNOSIS — K922 Gastrointestinal hemorrhage, unspecified: Secondary | ICD-10-CM | POA: Diagnosis not present

## 2024-06-20 DIAGNOSIS — M6281 Muscle weakness (generalized): Secondary | ICD-10-CM | POA: Diagnosis not present

## 2024-06-20 DIAGNOSIS — F5104 Psychophysiologic insomnia: Secondary | ICD-10-CM | POA: Diagnosis not present

## 2024-06-20 DIAGNOSIS — R41841 Cognitive communication deficit: Secondary | ICD-10-CM | POA: Diagnosis not present

## 2024-06-20 DIAGNOSIS — R296 Repeated falls: Secondary | ICD-10-CM | POA: Diagnosis not present

## 2024-06-20 DIAGNOSIS — R2689 Other abnormalities of gait and mobility: Secondary | ICD-10-CM | POA: Diagnosis not present

## 2024-06-20 DIAGNOSIS — F338 Other recurrent depressive disorders: Secondary | ICD-10-CM | POA: Diagnosis not present

## 2024-06-25 DIAGNOSIS — E119 Type 2 diabetes mellitus without complications: Secondary | ICD-10-CM | POA: Diagnosis not present

## 2024-06-25 DIAGNOSIS — E1140 Type 2 diabetes mellitus with diabetic neuropathy, unspecified: Secondary | ICD-10-CM | POA: Diagnosis not present

## 2024-06-25 DIAGNOSIS — L89893 Pressure ulcer of other site, stage 3: Secondary | ICD-10-CM | POA: Diagnosis not present

## 2024-06-25 DIAGNOSIS — M6281 Muscle weakness (generalized): Secondary | ICD-10-CM | POA: Diagnosis not present

## 2024-06-25 DIAGNOSIS — F0390 Unspecified dementia without behavioral disturbance: Secondary | ICD-10-CM | POA: Diagnosis not present

## 2024-06-26 ENCOUNTER — Other Ambulatory Visit: Payer: Self-pay | Admitting: Family Medicine

## 2024-07-02 DIAGNOSIS — F0390 Unspecified dementia without behavioral disturbance: Secondary | ICD-10-CM | POA: Diagnosis not present

## 2024-07-02 DIAGNOSIS — E119 Type 2 diabetes mellitus without complications: Secondary | ICD-10-CM | POA: Diagnosis not present

## 2024-07-02 DIAGNOSIS — M6281 Muscle weakness (generalized): Secondary | ICD-10-CM | POA: Diagnosis not present

## 2024-07-02 DIAGNOSIS — L89893 Pressure ulcer of other site, stage 3: Secondary | ICD-10-CM | POA: Diagnosis not present

## 2024-07-03 DIAGNOSIS — E1140 Type 2 diabetes mellitus with diabetic neuropathy, unspecified: Secondary | ICD-10-CM | POA: Diagnosis not present

## 2024-07-09 DIAGNOSIS — L89893 Pressure ulcer of other site, stage 3: Secondary | ICD-10-CM | POA: Diagnosis not present

## 2024-07-09 DIAGNOSIS — M6281 Muscle weakness (generalized): Secondary | ICD-10-CM | POA: Diagnosis not present

## 2024-07-09 DIAGNOSIS — E119 Type 2 diabetes mellitus without complications: Secondary | ICD-10-CM | POA: Diagnosis not present

## 2024-07-09 DIAGNOSIS — F0390 Unspecified dementia without behavioral disturbance: Secondary | ICD-10-CM | POA: Diagnosis not present

## 2024-07-16 DIAGNOSIS — L0889 Other specified local infections of the skin and subcutaneous tissue: Secondary | ICD-10-CM | POA: Diagnosis not present

## 2024-07-16 DIAGNOSIS — L89893 Pressure ulcer of other site, stage 3: Secondary | ICD-10-CM | POA: Diagnosis not present

## 2024-07-18 DIAGNOSIS — Z794 Long term (current) use of insulin: Secondary | ICD-10-CM | POA: Diagnosis not present

## 2024-07-18 DIAGNOSIS — R296 Repeated falls: Secondary | ICD-10-CM | POA: Diagnosis not present

## 2024-07-18 DIAGNOSIS — Z8679 Personal history of other diseases of the circulatory system: Secondary | ICD-10-CM | POA: Diagnosis not present

## 2024-07-18 DIAGNOSIS — I509 Heart failure, unspecified: Secondary | ICD-10-CM | POA: Diagnosis not present

## 2024-07-18 DIAGNOSIS — R600 Localized edema: Secondary | ICD-10-CM | POA: Diagnosis not present

## 2024-07-18 DIAGNOSIS — K750 Abscess of liver: Secondary | ICD-10-CM | POA: Diagnosis not present

## 2024-07-18 DIAGNOSIS — D6859 Other primary thrombophilia: Secondary | ICD-10-CM | POA: Diagnosis not present

## 2024-07-18 DIAGNOSIS — E1122 Type 2 diabetes mellitus with diabetic chronic kidney disease: Secondary | ICD-10-CM | POA: Diagnosis not present

## 2024-07-18 DIAGNOSIS — E785 Hyperlipidemia, unspecified: Secondary | ICD-10-CM | POA: Diagnosis not present

## 2024-07-18 DIAGNOSIS — Q2730 Arteriovenous malformation, site unspecified: Secondary | ICD-10-CM | POA: Diagnosis not present

## 2024-07-18 DIAGNOSIS — M069 Rheumatoid arthritis, unspecified: Secondary | ICD-10-CM | POA: Diagnosis not present

## 2024-07-18 DIAGNOSIS — N183 Chronic kidney disease, stage 3 unspecified: Secondary | ICD-10-CM | POA: Diagnosis not present

## 2024-07-19 DIAGNOSIS — R531 Weakness: Secondary | ICD-10-CM | POA: Diagnosis not present

## 2024-07-19 DIAGNOSIS — Z7901 Long term (current) use of anticoagulants: Secondary | ICD-10-CM | POA: Diagnosis not present

## 2024-07-19 DIAGNOSIS — E785 Hyperlipidemia, unspecified: Secondary | ICD-10-CM | POA: Diagnosis not present

## 2024-07-20 DIAGNOSIS — E1040 Type 1 diabetes mellitus with diabetic neuropathy, unspecified: Secondary | ICD-10-CM | POA: Diagnosis not present

## 2024-07-20 DIAGNOSIS — Z794 Long term (current) use of insulin: Secondary | ICD-10-CM | POA: Diagnosis not present

## 2024-07-23 DIAGNOSIS — L89893 Pressure ulcer of other site, stage 3: Secondary | ICD-10-CM | POA: Diagnosis not present

## 2024-07-23 DIAGNOSIS — L0889 Other specified local infections of the skin and subcutaneous tissue: Secondary | ICD-10-CM | POA: Diagnosis not present

## 2024-08-06 DIAGNOSIS — L89893 Pressure ulcer of other site, stage 3: Secondary | ICD-10-CM | POA: Diagnosis not present

## 2024-08-13 DIAGNOSIS — F0390 Unspecified dementia without behavioral disturbance: Secondary | ICD-10-CM | POA: Diagnosis not present

## 2024-08-13 DIAGNOSIS — L89893 Pressure ulcer of other site, stage 3: Secondary | ICD-10-CM | POA: Diagnosis not present

## 2024-08-13 DIAGNOSIS — M6281 Muscle weakness (generalized): Secondary | ICD-10-CM | POA: Diagnosis not present

## 2024-08-13 DIAGNOSIS — E119 Type 2 diabetes mellitus without complications: Secondary | ICD-10-CM | POA: Diagnosis not present

## 2024-08-17 DIAGNOSIS — Z794 Long term (current) use of insulin: Secondary | ICD-10-CM | POA: Diagnosis not present

## 2024-08-17 DIAGNOSIS — E1040 Type 1 diabetes mellitus with diabetic neuropathy, unspecified: Secondary | ICD-10-CM | POA: Diagnosis not present

## 2024-08-17 DIAGNOSIS — R7309 Other abnormal glucose: Secondary | ICD-10-CM | POA: Diagnosis not present

## 2024-08-20 DIAGNOSIS — L89893 Pressure ulcer of other site, stage 3: Secondary | ICD-10-CM | POA: Diagnosis not present

## 2024-08-22 DIAGNOSIS — F338 Other recurrent depressive disorders: Secondary | ICD-10-CM | POA: Diagnosis not present

## 2024-08-22 DIAGNOSIS — F5104 Psychophysiologic insomnia: Secondary | ICD-10-CM | POA: Diagnosis not present

## 2024-08-22 DIAGNOSIS — F0390 Unspecified dementia without behavioral disturbance: Secondary | ICD-10-CM | POA: Diagnosis not present

## 2024-08-24 DIAGNOSIS — Z1331 Encounter for screening for depression: Secondary | ICD-10-CM | POA: Diagnosis not present

## 2024-08-24 DIAGNOSIS — E1040 Type 1 diabetes mellitus with diabetic neuropathy, unspecified: Secondary | ICD-10-CM | POA: Diagnosis not present

## 2024-08-24 DIAGNOSIS — R7309 Other abnormal glucose: Secondary | ICD-10-CM | POA: Diagnosis not present

## 2024-08-24 DIAGNOSIS — Z794 Long term (current) use of insulin: Secondary | ICD-10-CM | POA: Diagnosis not present

## 2024-09-03 DIAGNOSIS — L89893 Pressure ulcer of other site, stage 3: Secondary | ICD-10-CM | POA: Diagnosis not present

## 2024-09-05 DIAGNOSIS — F338 Other recurrent depressive disorders: Secondary | ICD-10-CM | POA: Diagnosis not present

## 2024-09-05 DIAGNOSIS — F5104 Psychophysiologic insomnia: Secondary | ICD-10-CM | POA: Diagnosis not present

## 2024-09-05 DIAGNOSIS — F0390 Unspecified dementia without behavioral disturbance: Secondary | ICD-10-CM | POA: Diagnosis not present

## 2024-09-10 DIAGNOSIS — L89893 Pressure ulcer of other site, stage 3: Secondary | ICD-10-CM | POA: Diagnosis not present

## 2024-09-12 DIAGNOSIS — M069 Rheumatoid arthritis, unspecified: Secondary | ICD-10-CM | POA: Diagnosis not present

## 2024-09-17 DIAGNOSIS — R600 Localized edema: Secondary | ICD-10-CM | POA: Diagnosis not present

## 2024-09-17 DIAGNOSIS — L0889 Other specified local infections of the skin and subcutaneous tissue: Secondary | ICD-10-CM | POA: Diagnosis not present

## 2024-09-17 DIAGNOSIS — L89893 Pressure ulcer of other site, stage 3: Secondary | ICD-10-CM | POA: Diagnosis not present

## 2024-09-19 ENCOUNTER — Telehealth: Payer: Self-pay | Admitting: Rheumatology

## 2024-09-19 DIAGNOSIS — M25511 Pain in right shoulder: Secondary | ICD-10-CM | POA: Diagnosis not present

## 2024-09-19 DIAGNOSIS — M25412 Effusion, left shoulder: Secondary | ICD-10-CM | POA: Diagnosis not present

## 2024-09-19 DIAGNOSIS — M25512 Pain in left shoulder: Secondary | ICD-10-CM | POA: Diagnosis not present

## 2024-09-19 NOTE — Telephone Encounter (Signed)
 Spoke with patient's wife and advised Dr. Dolphus does not drain fluid from shoulders. Advised patient's wife that patient should see ortho. She states patient has an appointment in the morning and she would like to cancel the appointment.

## 2024-09-19 NOTE — Telephone Encounter (Signed)
 Patient calling requesting if Dr. Dolphus could see him ASAP to drain fluid from his shoulder.

## 2024-09-20 ENCOUNTER — Ambulatory Visit: Admitting: Rheumatology

## 2024-09-24 DIAGNOSIS — L89893 Pressure ulcer of other site, stage 3: Secondary | ICD-10-CM | POA: Diagnosis not present

## 2024-10-02 DIAGNOSIS — E1122 Type 2 diabetes mellitus with diabetic chronic kidney disease: Secondary | ICD-10-CM | POA: Diagnosis not present

## 2024-10-08 DIAGNOSIS — E119 Type 2 diabetes mellitus without complications: Secondary | ICD-10-CM | POA: Diagnosis not present

## 2024-10-08 DIAGNOSIS — L89893 Pressure ulcer of other site, stage 3: Secondary | ICD-10-CM | POA: Diagnosis not present

## 2024-10-08 DIAGNOSIS — M6281 Muscle weakness (generalized): Secondary | ICD-10-CM | POA: Diagnosis not present

## 2024-10-08 DIAGNOSIS — F0390 Unspecified dementia without behavioral disturbance: Secondary | ICD-10-CM | POA: Diagnosis not present

## 2024-10-14 ENCOUNTER — Other Ambulatory Visit: Payer: Self-pay | Admitting: Family Medicine

## 2024-11-13 ENCOUNTER — Other Ambulatory Visit: Payer: Self-pay | Admitting: Vascular Surgery

## 2024-11-13 DIAGNOSIS — M79604 Pain in right leg: Secondary | ICD-10-CM

## 2024-12-05 ENCOUNTER — Encounter: Admitting: Vascular Surgery

## 2024-12-05 ENCOUNTER — Ambulatory Visit (HOSPITAL_COMMUNITY)
